# Patient Record
Sex: Female | Born: 1965
Health system: Southern US, Community
[De-identification: ages and names within clinical notes are randomized; demographics above are authoritative.]

## PROBLEM LIST (undated history)

## (undated) DIAGNOSIS — E1165 Type 2 diabetes mellitus with hyperglycemia: Secondary | ICD-10-CM

## (undated) DIAGNOSIS — D803 Selective deficiency of immunoglobulin G [IgG] subclasses: Secondary | ICD-10-CM

## (undated) DIAGNOSIS — R519 Headache, unspecified: Secondary | ICD-10-CM

## (undated) DIAGNOSIS — E1140 Type 2 diabetes mellitus with diabetic neuropathy, unspecified: Secondary | ICD-10-CM

## (undated) DIAGNOSIS — E039 Hypothyroidism, unspecified: Secondary | ICD-10-CM

## (undated) DIAGNOSIS — I700 Atherosclerosis of aorta: Secondary | ICD-10-CM

## (undated) DIAGNOSIS — N183 Chronic kidney disease, stage 3 unspecified: Secondary | ICD-10-CM

## (undated) DIAGNOSIS — G4733 Obstructive sleep apnea (adult) (pediatric): Secondary | ICD-10-CM

## (undated) DIAGNOSIS — R51 Headache: Secondary | ICD-10-CM

## (undated) DIAGNOSIS — D509 Iron deficiency anemia, unspecified: Secondary | ICD-10-CM

## (undated) DIAGNOSIS — N189 Chronic kidney disease, unspecified: Secondary | ICD-10-CM

## (undated) DIAGNOSIS — U071 COVID-19: Secondary | ICD-10-CM

## (undated) DIAGNOSIS — J449 Chronic obstructive pulmonary disease, unspecified: Secondary | ICD-10-CM

## (undated) DIAGNOSIS — I2510 Atherosclerotic heart disease of native coronary artery without angina pectoris: Secondary | ICD-10-CM

## (undated) DIAGNOSIS — I499 Cardiac arrhythmia, unspecified: Secondary | ICD-10-CM

## (undated) DIAGNOSIS — M1990 Unspecified osteoarthritis, unspecified site: Secondary | ICD-10-CM

## (undated) DIAGNOSIS — J189 Pneumonia, unspecified organism: Secondary | ICD-10-CM

## (undated) DIAGNOSIS — K769 Liver disease, unspecified: Secondary | ICD-10-CM

## (undated) DIAGNOSIS — E785 Hyperlipidemia, unspecified: Secondary | ICD-10-CM

## (undated) DIAGNOSIS — R7881 Bacteremia: Secondary | ICD-10-CM

## (undated) DIAGNOSIS — K259 Gastric ulcer, unspecified as acute or chronic, without hemorrhage or perforation: Secondary | ICD-10-CM

## (undated) DIAGNOSIS — D126 Benign neoplasm of colon, unspecified: Secondary | ICD-10-CM

## (undated) DIAGNOSIS — R0600 Dyspnea, unspecified: Secondary | ICD-10-CM

## (undated) DIAGNOSIS — G4730 Sleep apnea, unspecified: Secondary | ICD-10-CM

## (undated) DIAGNOSIS — E559 Vitamin D deficiency, unspecified: Secondary | ICD-10-CM

## (undated) DIAGNOSIS — J42 Unspecified chronic bronchitis: Secondary | ICD-10-CM

## (undated) DIAGNOSIS — R000 Tachycardia, unspecified: Secondary | ICD-10-CM

## (undated) DIAGNOSIS — G2581 Restless legs syndrome: Secondary | ICD-10-CM

## (undated) DIAGNOSIS — N390 Urinary tract infection, site not specified: Secondary | ICD-10-CM

## (undated) DIAGNOSIS — E538 Deficiency of other specified B group vitamins: Secondary | ICD-10-CM

## (undated) DIAGNOSIS — K760 Fatty (change of) liver, not elsewhere classified: Secondary | ICD-10-CM

## (undated) DIAGNOSIS — G43909 Migraine, unspecified, not intractable, without status migrainosus: Secondary | ICD-10-CM

## (undated) DIAGNOSIS — K219 Gastro-esophageal reflux disease without esophagitis: Secondary | ICD-10-CM

## (undated) DIAGNOSIS — F32A Depression, unspecified: Secondary | ICD-10-CM

## (undated) DIAGNOSIS — R002 Palpitations: Secondary | ICD-10-CM

## (undated) DIAGNOSIS — I509 Heart failure, unspecified: Secondary | ICD-10-CM

## (undated) DIAGNOSIS — G8929 Other chronic pain: Secondary | ICD-10-CM

## (undated) DIAGNOSIS — R296 Repeated falls: Secondary | ICD-10-CM

## (undated) DIAGNOSIS — G9340 Encephalopathy, unspecified: Secondary | ICD-10-CM

## (undated) DIAGNOSIS — I38 Endocarditis, valve unspecified: Secondary | ICD-10-CM

## (undated) DIAGNOSIS — F319 Bipolar disorder, unspecified: Secondary | ICD-10-CM

## (undated) DIAGNOSIS — I10 Essential (primary) hypertension: Secondary | ICD-10-CM

## (undated) DIAGNOSIS — J302 Other seasonal allergic rhinitis: Secondary | ICD-10-CM

## (undated) DIAGNOSIS — I219 Acute myocardial infarction, unspecified: Secondary | ICD-10-CM

## (undated) DIAGNOSIS — Z7982 Long term (current) use of aspirin: Secondary | ICD-10-CM

## (undated) DIAGNOSIS — K7581 Nonalcoholic steatohepatitis (NASH): Secondary | ICD-10-CM

## (undated) DIAGNOSIS — R160 Hepatomegaly, not elsewhere classified: Secondary | ICD-10-CM

## (undated) DIAGNOSIS — G459 Transient cerebral ischemic attack, unspecified: Secondary | ICD-10-CM

## (undated) DIAGNOSIS — N3010 Interstitial cystitis (chronic) without hematuria: Secondary | ICD-10-CM

## (undated) DIAGNOSIS — R32 Unspecified urinary incontinence: Secondary | ICD-10-CM

## (undated) DIAGNOSIS — I6789 Other cerebrovascular disease: Secondary | ICD-10-CM

## (undated) DIAGNOSIS — F329 Major depressive disorder, single episode, unspecified: Secondary | ICD-10-CM

## (undated) DIAGNOSIS — I739 Peripheral vascular disease, unspecified: Secondary | ICD-10-CM

## (undated) DIAGNOSIS — K5900 Constipation, unspecified: Secondary | ICD-10-CM

## (undated) DIAGNOSIS — F419 Anxiety disorder, unspecified: Secondary | ICD-10-CM

## (undated) DIAGNOSIS — Z7902 Long term (current) use of antithrombotics/antiplatelets: Secondary | ICD-10-CM

## (undated) DIAGNOSIS — J45909 Unspecified asthma, uncomplicated: Secondary | ICD-10-CM

## (undated) DIAGNOSIS — F250 Schizoaffective disorder, bipolar type: Secondary | ICD-10-CM

## (undated) DIAGNOSIS — R42 Dizziness and giddiness: Secondary | ICD-10-CM

## (undated) DIAGNOSIS — G589 Mononeuropathy, unspecified: Secondary | ICD-10-CM

## (undated) HISTORY — DX: Headache: R51

## (undated) HISTORY — PX: CHOLECYSTECTOMY: SHX55

## (undated) HISTORY — DX: Pneumonia, unspecified organism: J18.9

## (undated) HISTORY — DX: Bacteremia: R78.81

## (undated) HISTORY — DX: Endocarditis, valve unspecified: I38

## (undated) HISTORY — DX: Chronic obstructive pulmonary disease, unspecified: J44.9

## (undated) HISTORY — DX: Bipolar disorder, unspecified: F31.9

## (undated) HISTORY — DX: Hypomagnesemia: E83.42

## (undated) HISTORY — PX: UPPER GI ENDOSCOPY: SHX6162

## (undated) HISTORY — DX: Headache, unspecified: R51.9

## (undated) HISTORY — DX: Restless legs syndrome: G25.81

## (undated) HISTORY — DX: Type 2 diabetes mellitus with diabetic neuropathy, unspecified: E11.40

## (undated) HISTORY — DX: Gastro-esophageal reflux disease without esophagitis: K21.9

## (undated) HISTORY — DX: COVID-19: U07.1

## (undated) HISTORY — DX: Hyperlipidemia, unspecified: E78.5

## (undated) HISTORY — DX: Type 2 diabetes mellitus with hyperglycemia: E11.65

## (undated) HISTORY — DX: Obstructive sleep apnea (adult) (pediatric): G47.33

## (undated) HISTORY — DX: Mononeuropathy, unspecified: G58.9

## (undated) HISTORY — DX: Other chronic pain: G89.29

## (undated) HISTORY — DX: Essential (primary) hypertension: I10

## (undated) HISTORY — DX: Other seasonal allergic rhinitis: J30.2

## (undated) HISTORY — DX: Morbid (severe) obesity due to excess calories: E66.01

## (undated) HISTORY — PX: ABDOMINAL HYSTERECTOMY: SHX81

## (undated) HISTORY — DX: Acute myocardial infarction, unspecified: I21.9

## (undated) HISTORY — DX: Vitamin D deficiency, unspecified: E55.9

## (undated) HISTORY — DX: Sleep apnea, unspecified: G47.30

## (undated) HISTORY — DX: Hypothyroidism, unspecified: E03.9

## (undated) HISTORY — DX: Liver disease, unspecified: K76.9

## (undated) HISTORY — DX: Transient cerebral ischemic attack, unspecified: G45.9

## (undated) HISTORY — DX: Unspecified asthma, uncomplicated: J45.909

## (undated) HISTORY — PX: BLADDER SURGERY: SHX569

## (undated) HISTORY — DX: Cardiac arrhythmia, unspecified: I49.9

## (undated) HISTORY — DX: Unspecified urinary incontinence: R32

## (undated) HISTORY — PX: TUBAL LIGATION: SHX77

## (undated) HISTORY — DX: Migraine, unspecified, not intractable, without status migrainosus: G43.909

## (undated) HISTORY — DX: Urinary tract infection, site not specified: N39.0

---

## 1997-12-07 ENCOUNTER — Emergency Department (HOSPITAL_COMMUNITY): Admission: EM | Admit: 1997-12-07 | Discharge: 1997-12-07 | Payer: Self-pay | Admitting: Emergency Medicine

## 1998-01-06 ENCOUNTER — Emergency Department (HOSPITAL_COMMUNITY): Admission: EM | Admit: 1998-01-06 | Discharge: 1998-01-06 | Payer: Self-pay | Admitting: Emergency Medicine

## 1998-01-23 ENCOUNTER — Emergency Department (HOSPITAL_COMMUNITY): Admission: EM | Admit: 1998-01-23 | Discharge: 1998-01-23 | Payer: Self-pay | Admitting: Emergency Medicine

## 1998-01-29 ENCOUNTER — Encounter: Admission: RE | Admit: 1998-01-29 | Discharge: 1998-01-29 | Payer: Self-pay | Admitting: Internal Medicine

## 1998-04-08 ENCOUNTER — Encounter: Admission: RE | Admit: 1998-04-08 | Discharge: 1998-04-08 | Payer: Self-pay | Admitting: Internal Medicine

## 1998-04-26 ENCOUNTER — Encounter: Payer: Self-pay | Admitting: Emergency Medicine

## 1998-04-26 ENCOUNTER — Encounter: Admission: RE | Admit: 1998-04-26 | Discharge: 1998-04-26 | Payer: Self-pay | Admitting: Internal Medicine

## 1998-04-26 ENCOUNTER — Emergency Department (HOSPITAL_COMMUNITY): Admission: EM | Admit: 1998-04-26 | Discharge: 1998-04-26 | Payer: Self-pay | Admitting: Emergency Medicine

## 1998-05-24 ENCOUNTER — Encounter: Admission: RE | Admit: 1998-05-24 | Discharge: 1998-05-24 | Payer: Self-pay | Admitting: Internal Medicine

## 1998-06-16 ENCOUNTER — Emergency Department (HOSPITAL_COMMUNITY): Admission: EM | Admit: 1998-06-16 | Discharge: 1998-06-16 | Payer: Self-pay | Admitting: Emergency Medicine

## 1998-06-24 ENCOUNTER — Ambulatory Visit (HOSPITAL_COMMUNITY): Admission: RE | Admit: 1998-06-24 | Discharge: 1998-06-24 | Payer: Self-pay | Admitting: Internal Medicine

## 1998-06-24 ENCOUNTER — Other Ambulatory Visit: Admission: RE | Admit: 1998-06-24 | Discharge: 1998-06-24 | Payer: Self-pay | Admitting: *Deleted

## 1998-06-24 ENCOUNTER — Encounter: Admission: RE | Admit: 1998-06-24 | Discharge: 1998-06-24 | Payer: Self-pay | Admitting: Internal Medicine

## 1998-07-14 ENCOUNTER — Encounter: Admission: RE | Admit: 1998-07-14 | Discharge: 1998-07-14 | Payer: Self-pay | Admitting: Hematology and Oncology

## 1998-07-15 ENCOUNTER — Encounter: Admission: RE | Admit: 1998-07-15 | Discharge: 1998-07-15 | Payer: Self-pay | Admitting: Internal Medicine

## 1998-12-09 ENCOUNTER — Emergency Department (HOSPITAL_COMMUNITY): Admission: EM | Admit: 1998-12-09 | Discharge: 1998-12-09 | Payer: Self-pay | Admitting: Emergency Medicine

## 1998-12-09 ENCOUNTER — Encounter: Payer: Self-pay | Admitting: Emergency Medicine

## 1998-12-13 ENCOUNTER — Encounter: Admission: RE | Admit: 1998-12-13 | Discharge: 1998-12-13 | Payer: Self-pay | Admitting: Internal Medicine

## 1998-12-20 ENCOUNTER — Encounter: Admission: RE | Admit: 1998-12-20 | Discharge: 1998-12-20 | Payer: Self-pay | Admitting: Internal Medicine

## 1998-12-27 ENCOUNTER — Encounter: Admission: RE | Admit: 1998-12-27 | Discharge: 1998-12-27 | Payer: Self-pay | Admitting: Internal Medicine

## 1999-01-21 ENCOUNTER — Encounter: Admission: RE | Admit: 1999-01-21 | Discharge: 1999-01-21 | Payer: Self-pay | Admitting: Internal Medicine

## 1999-01-27 ENCOUNTER — Encounter: Admission: RE | Admit: 1999-01-27 | Discharge: 1999-01-27 | Payer: Self-pay | Admitting: Internal Medicine

## 1999-02-01 ENCOUNTER — Emergency Department (HOSPITAL_COMMUNITY): Admission: EM | Admit: 1999-02-01 | Discharge: 1999-02-01 | Payer: Self-pay | Admitting: Emergency Medicine

## 1999-02-04 ENCOUNTER — Encounter: Admission: RE | Admit: 1999-02-04 | Discharge: 1999-02-04 | Payer: Self-pay | Admitting: Internal Medicine

## 1999-02-09 ENCOUNTER — Encounter: Admission: RE | Admit: 1999-02-09 | Discharge: 1999-02-09 | Payer: Self-pay | Admitting: Internal Medicine

## 2000-01-27 ENCOUNTER — Observation Stay (HOSPITAL_COMMUNITY): Admission: AD | Admit: 2000-01-27 | Discharge: 2000-01-28 | Payer: Self-pay | Admitting: Cardiology

## 2000-02-10 ENCOUNTER — Encounter (INDEPENDENT_AMBULATORY_CARE_PROVIDER_SITE_OTHER): Payer: Self-pay | Admitting: Specialist

## 2000-02-10 ENCOUNTER — Encounter: Payer: Self-pay | Admitting: General Surgery

## 2000-02-10 ENCOUNTER — Observation Stay: Admission: RE | Admit: 2000-02-10 | Discharge: 2000-02-12 | Payer: Self-pay | Admitting: General Surgery

## 2000-12-13 ENCOUNTER — Emergency Department (HOSPITAL_COMMUNITY): Admission: EM | Admit: 2000-12-13 | Discharge: 2000-12-14 | Payer: Self-pay | Admitting: *Deleted

## 2001-02-02 ENCOUNTER — Emergency Department (HOSPITAL_COMMUNITY): Admission: EM | Admit: 2001-02-02 | Discharge: 2001-02-02 | Payer: Self-pay | Admitting: Emergency Medicine

## 2001-04-05 ENCOUNTER — Emergency Department (HOSPITAL_COMMUNITY): Admission: EM | Admit: 2001-04-05 | Discharge: 2001-04-06 | Payer: Self-pay | Admitting: *Deleted

## 2001-04-17 ENCOUNTER — Emergency Department (HOSPITAL_COMMUNITY): Admission: EM | Admit: 2001-04-17 | Discharge: 2001-04-17 | Payer: Self-pay | Admitting: Emergency Medicine

## 2001-08-31 ENCOUNTER — Emergency Department (HOSPITAL_COMMUNITY): Admission: EM | Admit: 2001-08-31 | Discharge: 2001-08-31 | Payer: Self-pay | Admitting: *Deleted

## 2001-09-18 ENCOUNTER — Emergency Department (HOSPITAL_COMMUNITY): Admission: EM | Admit: 2001-09-18 | Discharge: 2001-09-18 | Payer: Self-pay | Admitting: Emergency Medicine

## 2001-10-11 ENCOUNTER — Emergency Department (HOSPITAL_COMMUNITY): Admission: EM | Admit: 2001-10-11 | Discharge: 2001-10-11 | Payer: Self-pay | Admitting: Emergency Medicine

## 2002-01-19 ENCOUNTER — Encounter: Payer: Self-pay | Admitting: *Deleted

## 2002-01-19 ENCOUNTER — Emergency Department (HOSPITAL_COMMUNITY): Admission: EM | Admit: 2002-01-19 | Discharge: 2002-01-19 | Payer: Self-pay | Admitting: *Deleted

## 2004-03-12 ENCOUNTER — Other Ambulatory Visit: Payer: Self-pay

## 2004-03-27 ENCOUNTER — Other Ambulatory Visit: Payer: Self-pay

## 2004-03-31 ENCOUNTER — Other Ambulatory Visit: Payer: Self-pay

## 2004-04-03 ENCOUNTER — Other Ambulatory Visit: Payer: Self-pay

## 2004-07-11 ENCOUNTER — Ambulatory Visit: Payer: Self-pay | Admitting: Family Medicine

## 2004-10-05 ENCOUNTER — Ambulatory Visit: Payer: Self-pay | Admitting: Family Medicine

## 2004-10-23 ENCOUNTER — Inpatient Hospital Stay: Payer: Self-pay | Admitting: Unknown Physician Specialty

## 2004-11-07 ENCOUNTER — Inpatient Hospital Stay: Payer: Self-pay | Admitting: Unknown Physician Specialty

## 2004-11-20 ENCOUNTER — Emergency Department: Payer: Self-pay | Admitting: Emergency Medicine

## 2004-11-24 ENCOUNTER — Emergency Department: Payer: Self-pay | Admitting: Emergency Medicine

## 2004-12-16 ENCOUNTER — Emergency Department: Payer: Self-pay | Admitting: Emergency Medicine

## 2004-12-30 ENCOUNTER — Emergency Department: Payer: Self-pay | Admitting: Emergency Medicine

## 2005-01-21 ENCOUNTER — Emergency Department: Payer: Self-pay | Admitting: Unknown Physician Specialty

## 2005-01-29 ENCOUNTER — Inpatient Hospital Stay (HOSPITAL_COMMUNITY): Admission: EM | Admit: 2005-01-29 | Discharge: 2005-02-01 | Payer: Self-pay | Admitting: Emergency Medicine

## 2005-02-03 ENCOUNTER — Emergency Department (HOSPITAL_COMMUNITY): Admission: EM | Admit: 2005-02-03 | Discharge: 2005-02-04 | Payer: Self-pay | Admitting: Emergency Medicine

## 2005-02-18 ENCOUNTER — Inpatient Hospital Stay: Payer: Self-pay | Admitting: Psychiatry

## 2005-02-18 ENCOUNTER — Other Ambulatory Visit: Payer: Self-pay

## 2005-02-24 ENCOUNTER — Emergency Department: Payer: Self-pay | Admitting: Unknown Physician Specialty

## 2005-02-24 ENCOUNTER — Other Ambulatory Visit: Payer: Self-pay

## 2005-03-15 ENCOUNTER — Ambulatory Visit: Payer: Self-pay | Admitting: Urology

## 2005-04-07 ENCOUNTER — Ambulatory Visit: Payer: Self-pay | Admitting: Family Medicine

## 2005-04-08 ENCOUNTER — Inpatient Hospital Stay: Payer: Self-pay | Admitting: Psychiatry

## 2005-04-09 ENCOUNTER — Other Ambulatory Visit: Payer: Self-pay

## 2005-04-20 ENCOUNTER — Emergency Department: Payer: Self-pay | Admitting: Internal Medicine

## 2005-06-10 ENCOUNTER — Emergency Department: Payer: Self-pay | Admitting: Emergency Medicine

## 2005-06-14 ENCOUNTER — Emergency Department: Payer: Self-pay | Admitting: Emergency Medicine

## 2005-06-24 ENCOUNTER — Emergency Department: Payer: Self-pay | Admitting: Emergency Medicine

## 2005-07-09 ENCOUNTER — Emergency Department: Payer: Self-pay | Admitting: Emergency Medicine

## 2005-07-30 ENCOUNTER — Emergency Department: Payer: Self-pay | Admitting: Unknown Physician Specialty

## 2005-08-18 ENCOUNTER — Emergency Department: Payer: Self-pay | Admitting: Emergency Medicine

## 2005-09-04 HISTORY — PX: OTHER SURGICAL HISTORY: SHX169

## 2005-09-14 ENCOUNTER — Other Ambulatory Visit: Payer: Self-pay

## 2005-09-16 ENCOUNTER — Inpatient Hospital Stay: Payer: Self-pay

## 2005-09-17 ENCOUNTER — Emergency Department: Payer: Self-pay | Admitting: Emergency Medicine

## 2005-09-17 ENCOUNTER — Other Ambulatory Visit: Payer: Self-pay

## 2005-09-21 ENCOUNTER — Emergency Department: Payer: Self-pay | Admitting: Emergency Medicine

## 2005-09-27 ENCOUNTER — Emergency Department: Payer: Self-pay | Admitting: Emergency Medicine

## 2005-10-01 ENCOUNTER — Emergency Department: Payer: Self-pay | Admitting: Emergency Medicine

## 2005-10-01 ENCOUNTER — Other Ambulatory Visit: Payer: Self-pay

## 2005-10-06 ENCOUNTER — Emergency Department: Payer: Self-pay | Admitting: Emergency Medicine

## 2005-10-15 ENCOUNTER — Other Ambulatory Visit: Payer: Self-pay

## 2005-10-15 ENCOUNTER — Inpatient Hospital Stay: Payer: Self-pay | Admitting: Internal Medicine

## 2005-11-22 ENCOUNTER — Emergency Department: Payer: Self-pay | Admitting: Unknown Physician Specialty

## 2005-11-25 ENCOUNTER — Emergency Department: Payer: Self-pay | Admitting: Unknown Physician Specialty

## 2005-11-25 ENCOUNTER — Other Ambulatory Visit: Payer: Self-pay

## 2005-12-05 ENCOUNTER — Emergency Department: Payer: Self-pay | Admitting: Emergency Medicine

## 2005-12-05 ENCOUNTER — Other Ambulatory Visit: Payer: Self-pay

## 2005-12-17 ENCOUNTER — Emergency Department: Payer: Self-pay | Admitting: Emergency Medicine

## 2006-01-07 ENCOUNTER — Emergency Department: Payer: Self-pay | Admitting: Emergency Medicine

## 2006-01-24 ENCOUNTER — Inpatient Hospital Stay: Payer: Self-pay | Admitting: Psychiatry

## 2006-02-12 ENCOUNTER — Inpatient Hospital Stay: Payer: Self-pay | Admitting: Unknown Physician Specialty

## 2006-03-04 ENCOUNTER — Emergency Department: Payer: Self-pay | Admitting: General Practice

## 2006-03-10 ENCOUNTER — Other Ambulatory Visit: Payer: Self-pay

## 2006-03-10 ENCOUNTER — Emergency Department: Payer: Self-pay | Admitting: Emergency Medicine

## 2006-04-01 ENCOUNTER — Emergency Department: Payer: Self-pay | Admitting: Emergency Medicine

## 2006-04-06 ENCOUNTER — Emergency Department: Payer: Self-pay | Admitting: Unknown Physician Specialty

## 2006-04-06 ENCOUNTER — Other Ambulatory Visit: Payer: Self-pay

## 2006-04-17 ENCOUNTER — Other Ambulatory Visit: Payer: Self-pay

## 2006-04-17 ENCOUNTER — Inpatient Hospital Stay: Payer: Self-pay | Admitting: Unknown Physician Specialty

## 2006-05-11 ENCOUNTER — Emergency Department: Payer: Self-pay | Admitting: Emergency Medicine

## 2006-05-21 ENCOUNTER — Emergency Department: Payer: Self-pay | Admitting: Emergency Medicine

## 2006-05-21 ENCOUNTER — Other Ambulatory Visit: Payer: Self-pay

## 2006-06-20 ENCOUNTER — Emergency Department: Payer: Self-pay

## 2006-07-01 ENCOUNTER — Emergency Department: Payer: Self-pay | Admitting: Emergency Medicine

## 2006-07-28 ENCOUNTER — Emergency Department: Payer: Self-pay | Admitting: Emergency Medicine

## 2006-08-12 ENCOUNTER — Emergency Department: Payer: Self-pay | Admitting: Emergency Medicine

## 2006-08-25 IMAGING — CR DG ANKLE COMPLETE 3+V*L*
1 series · 5 of 5 positions shown · non-contrast
Comparison: none

REASON FOR EXAM: Pain, swelling
COMMENTS:

PROCEDURE:     DXR - DXR ANKLE LEFT COMPLETE  - January 21, 2005  [DATE]
RESULT:     This study was compared to the previous study dated
11-20-04.
Soft tissue swelling is appreciated within the medial malleolar region.
There does not appear to be evidence of fracture or dislocation.

[Series 1: view not recorded · 0.17mm/px · 5 of 5 slices shown]
[im 1/5]
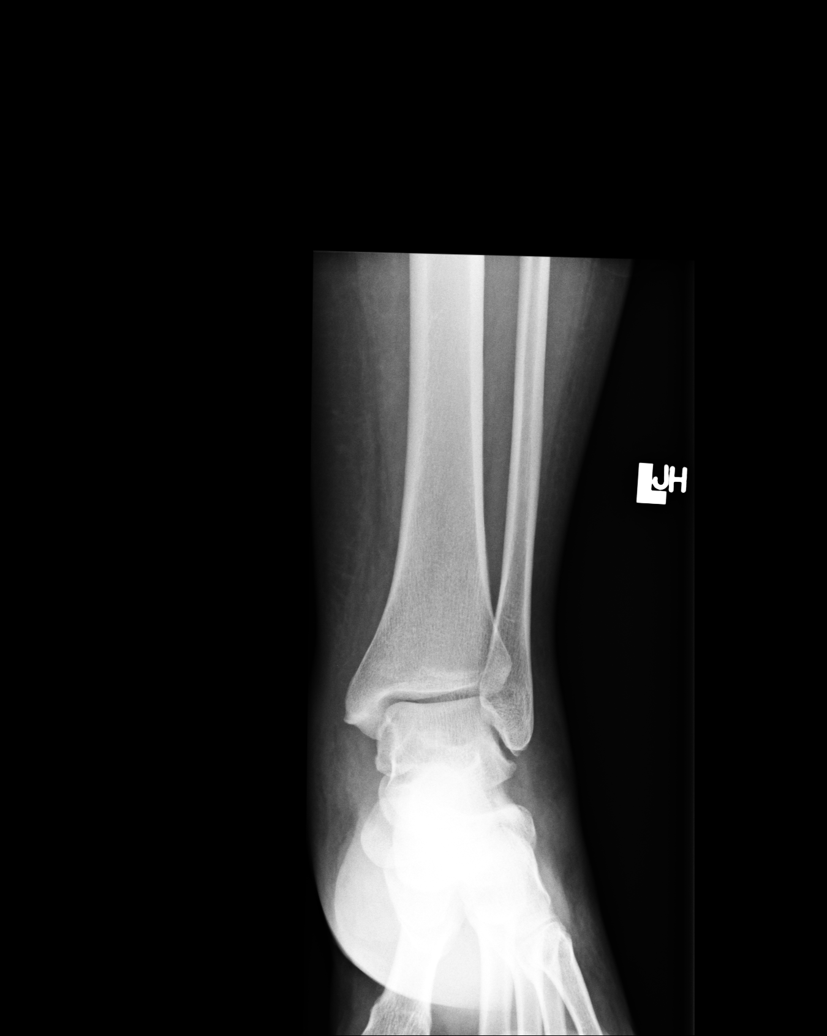
[im 2/5]
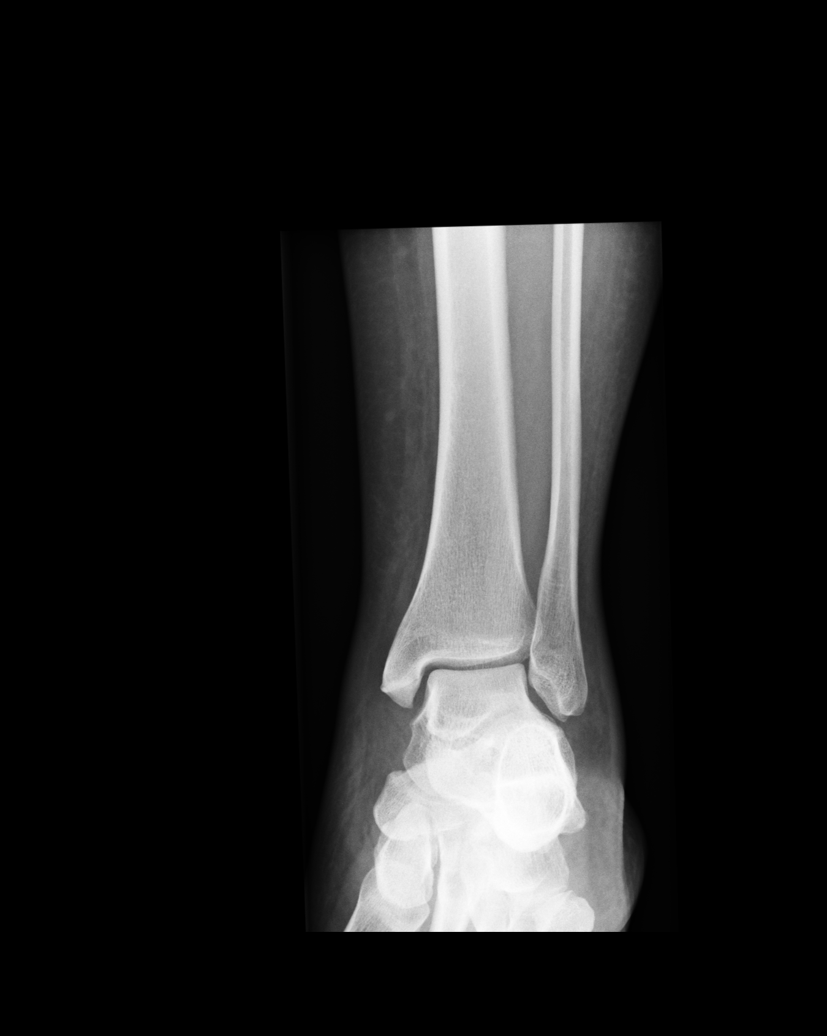
[im 3/5]
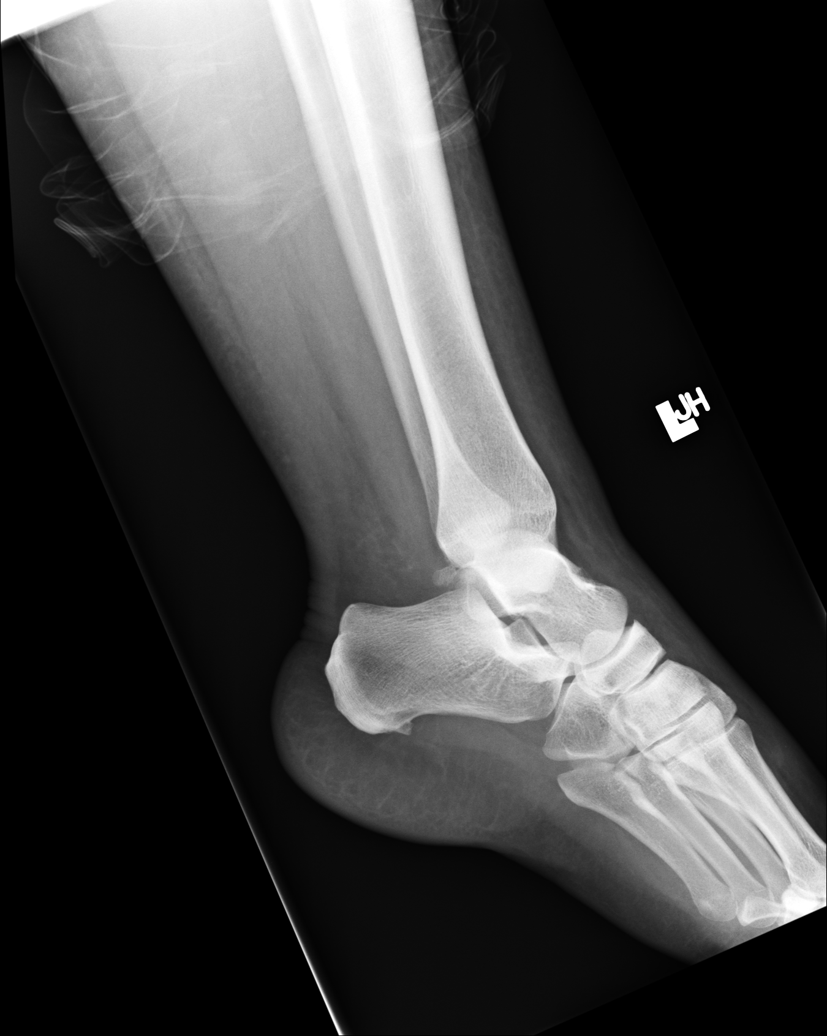
[im 4/5]
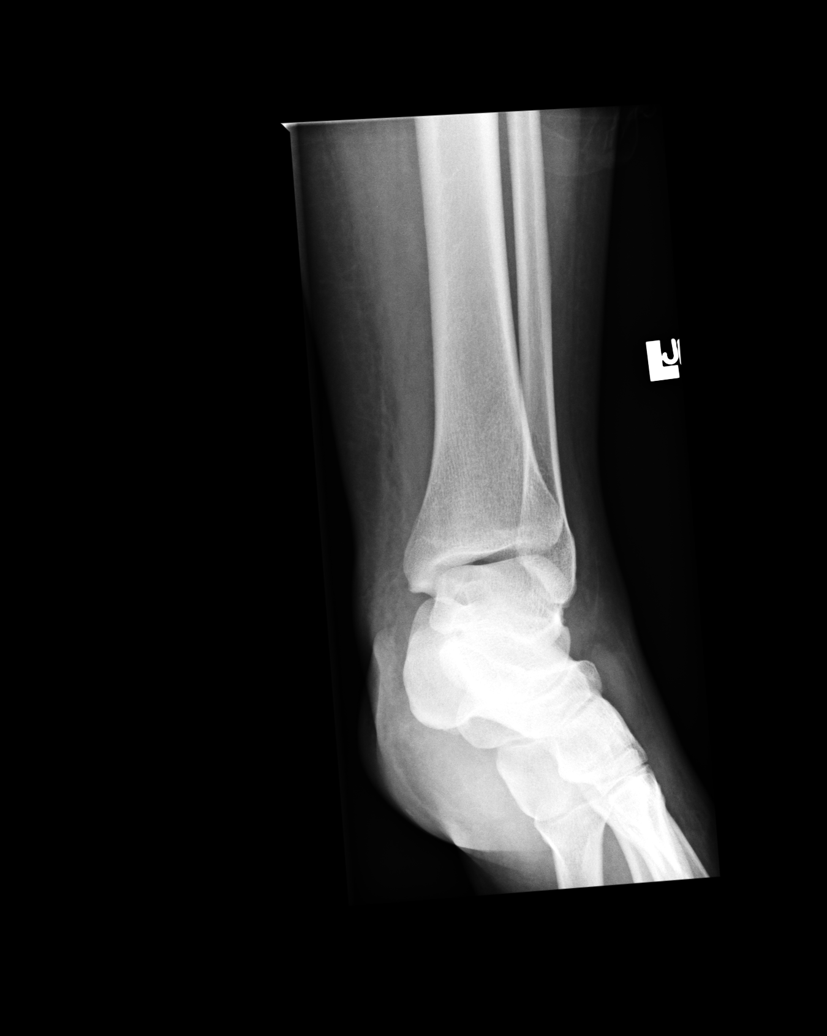
[im 5/5]
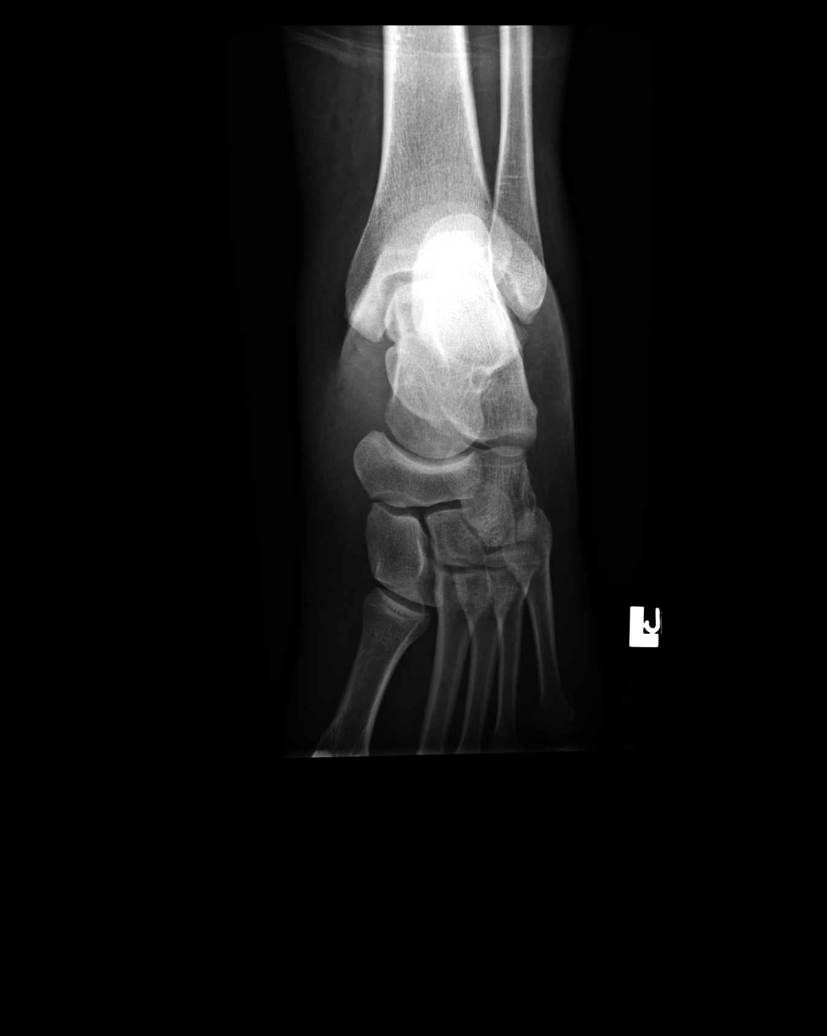

[5 of 5 positions shown; findings below may reference images not displayed]

IMPRESSION: Findings possibly representing an element of ligamentous injury without
evidence of fracture or dislocation.

## 2006-08-27 ENCOUNTER — Other Ambulatory Visit: Payer: Self-pay

## 2006-08-27 ENCOUNTER — Emergency Department: Payer: Self-pay | Admitting: Emergency Medicine

## 2006-08-31 ENCOUNTER — Emergency Department: Payer: Self-pay | Admitting: Emergency Medicine

## 2006-09-01 ENCOUNTER — Inpatient Hospital Stay: Payer: Self-pay | Admitting: Psychiatry

## 2006-09-04 IMAGING — CR DG CHEST 2V
2 series · 2 of 2 positions shown · non-contrast
Comparison: none

HISTORY: Chest pain, catheterization

CHEST 2 VIEWS:
No prior exam available for comparison
Upper normal heart size.
Normal mediastinal contours and vascularity.
Mild bronchitic changes.
No infiltrate, failure, or effusion.
No pneumothorax.

[view not recorded (1 of 2)]
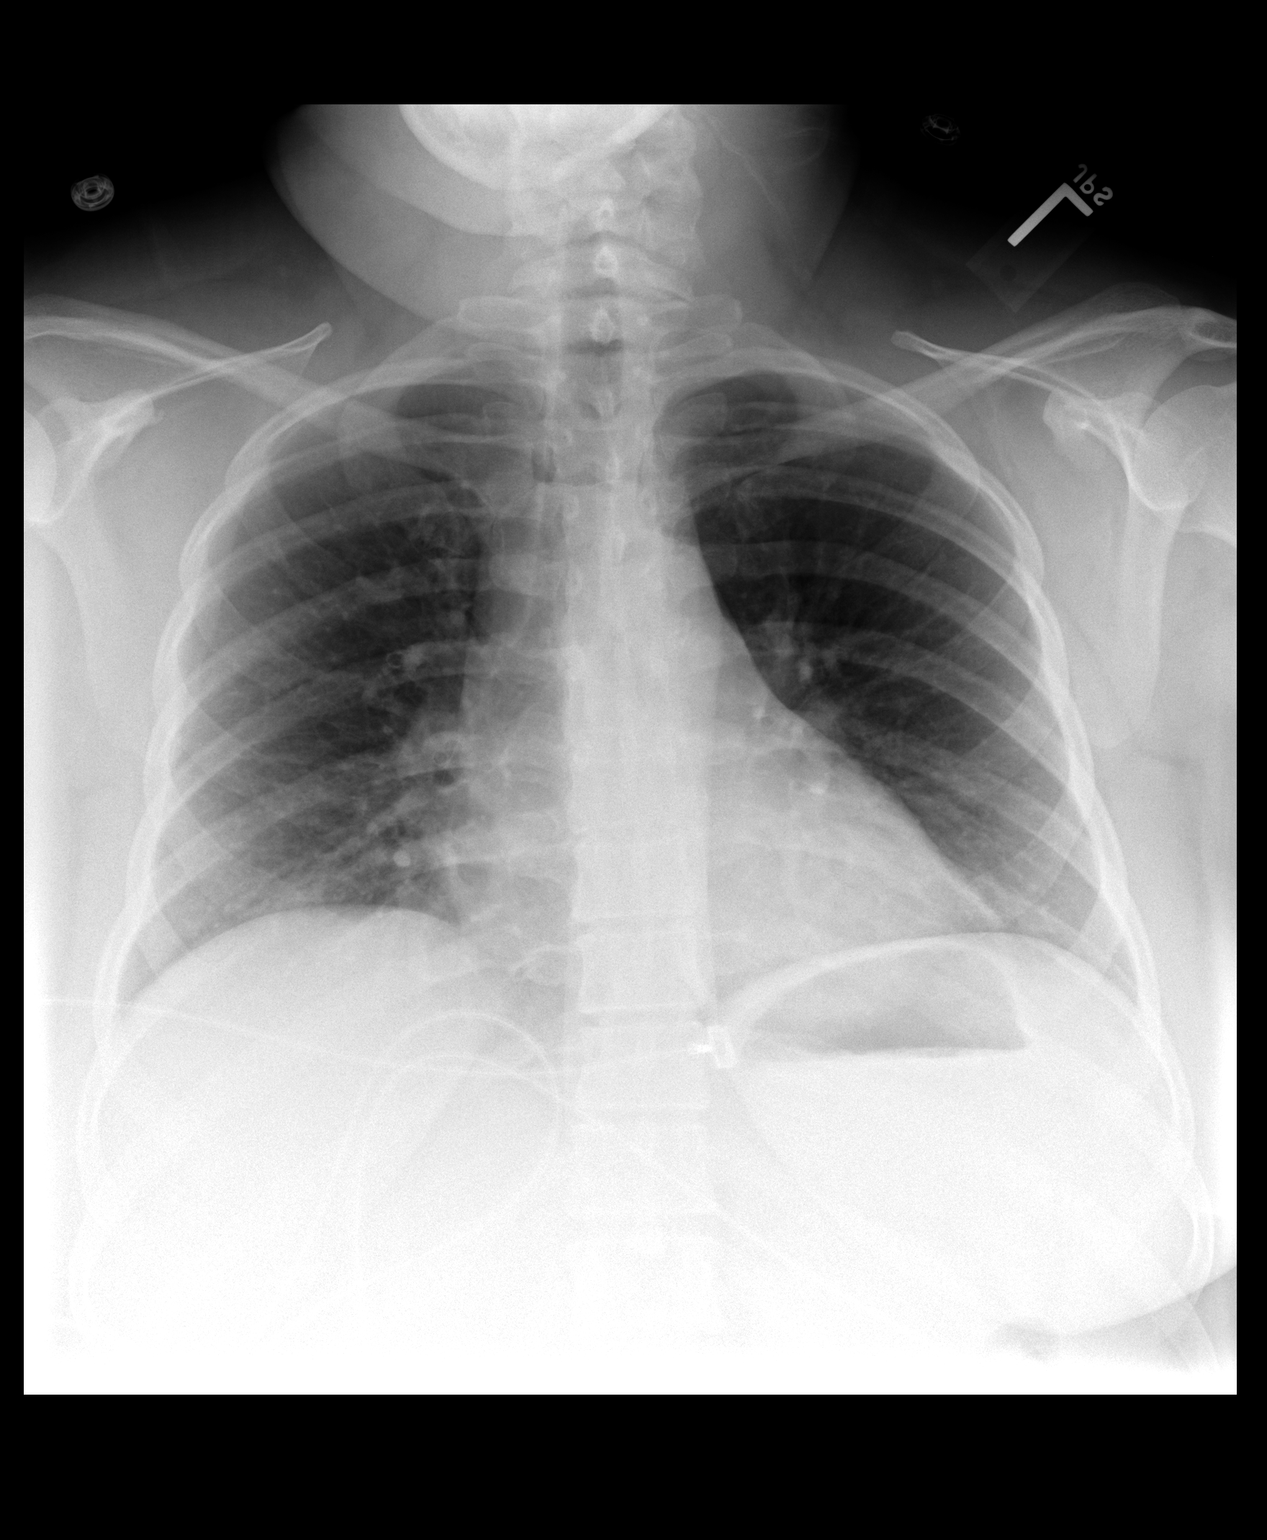

[view not recorded (2 of 2)]
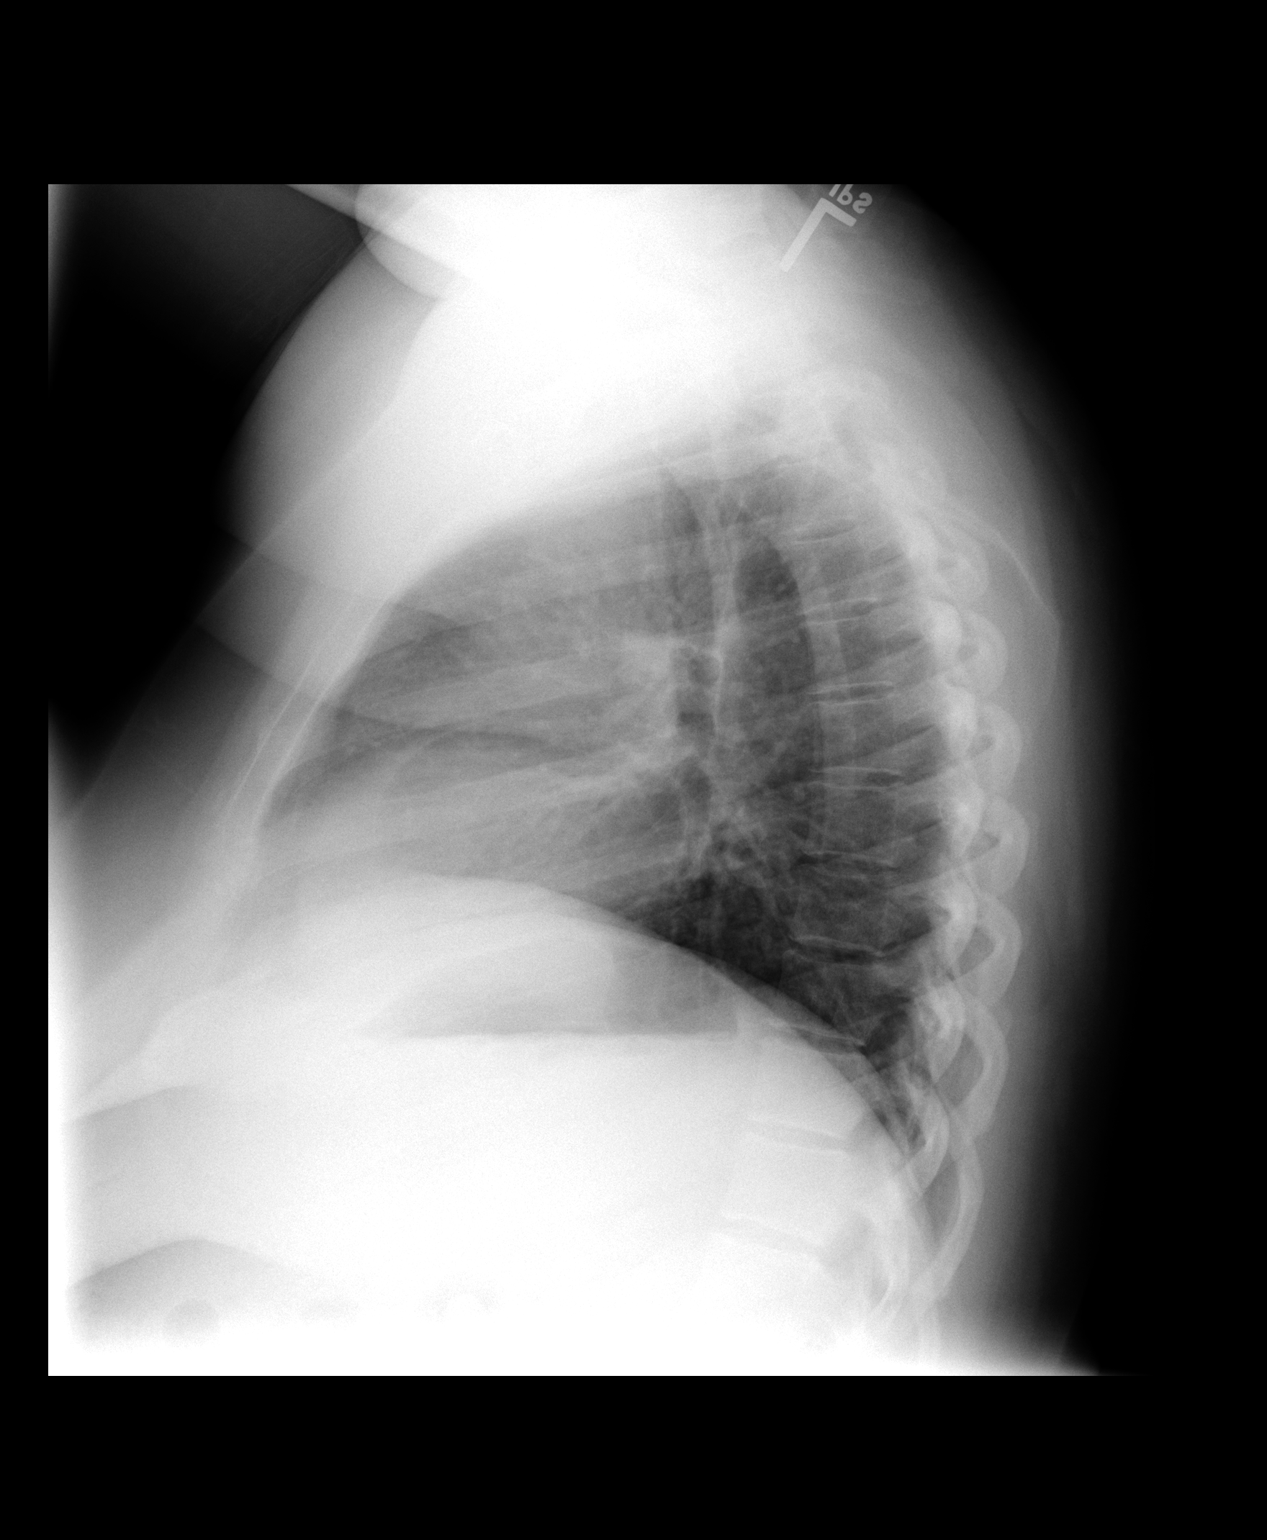

[2 of 2 positions shown; findings below may reference images not displayed]

IMPRESSION: Minimal bronchitic changes.

## 2006-09-07 IMAGING — CR DG CHEST 1V PORT
1 series · 1 of 1 positions shown · non-contrast
Comparison: 01/31/05.

CLINICAL DATA: Chest pain. Diabetes and hypertension.
 PORTABLE CHEST - 1 VIEW:

[view not recorded]
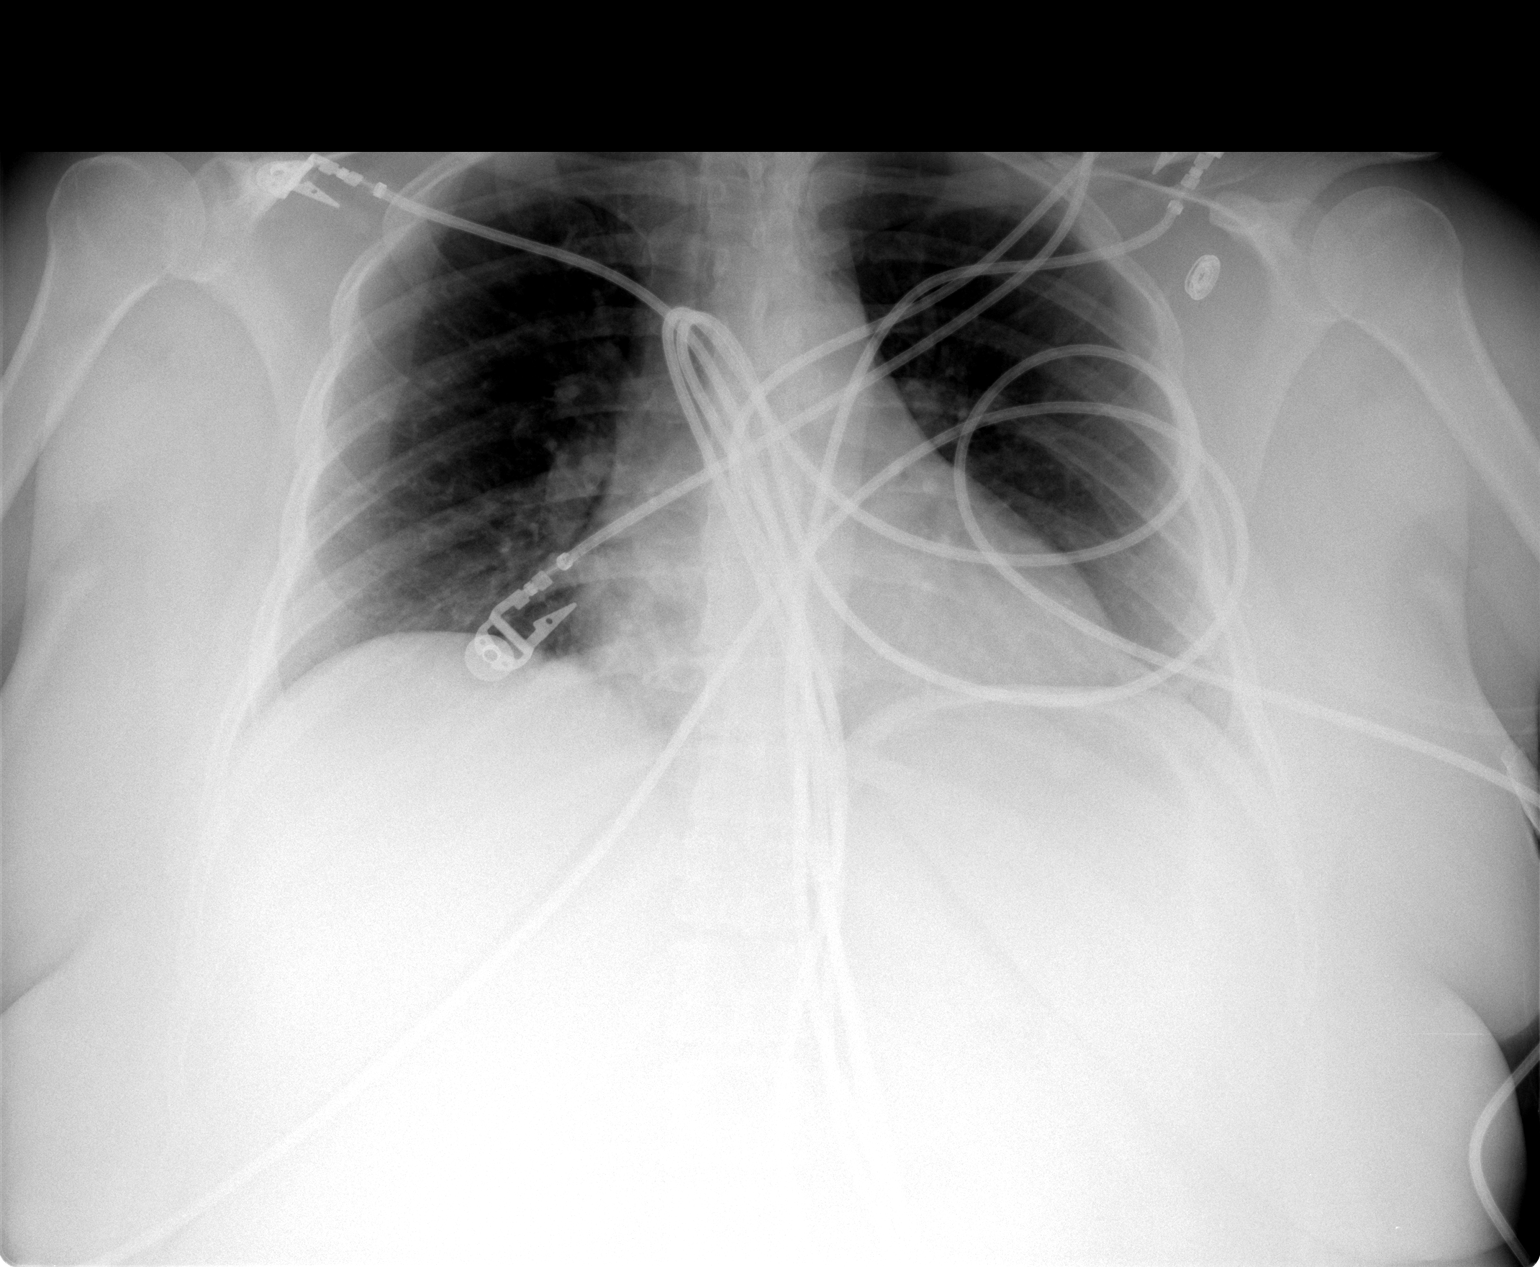

[1 of 1 positions shown; findings below may reference images not displayed]

The heart size and mediastinal contours are within normal limits.  The lungs are clear.
IMPRESSION: No acute disease.

## 2006-09-09 ENCOUNTER — Emergency Department: Payer: Self-pay | Admitting: Emergency Medicine

## 2006-09-27 ENCOUNTER — Emergency Department: Payer: Self-pay | Admitting: Emergency Medicine

## 2006-09-28 IMAGING — CR DG CHEST 2V
1 series · 2 of 2 positions shown · non-contrast
Comparison: none

REASON FOR EXAM: chest pain
COMMENTS:

PROCEDURE:     DXR - DXR CHEST PA (OR AP) AND LATERAL  - February 24, 2005 [DATE]
RESULT:        The mediastinum and hilar structures are normal.
Subsegmental atelectasis is noted in the lung bases.  Cardiovascular
structures are unremarkable.

[Series 1: view not recorded · 0.17mm/px · 2 of 2 slices shown]
[im 1/2]
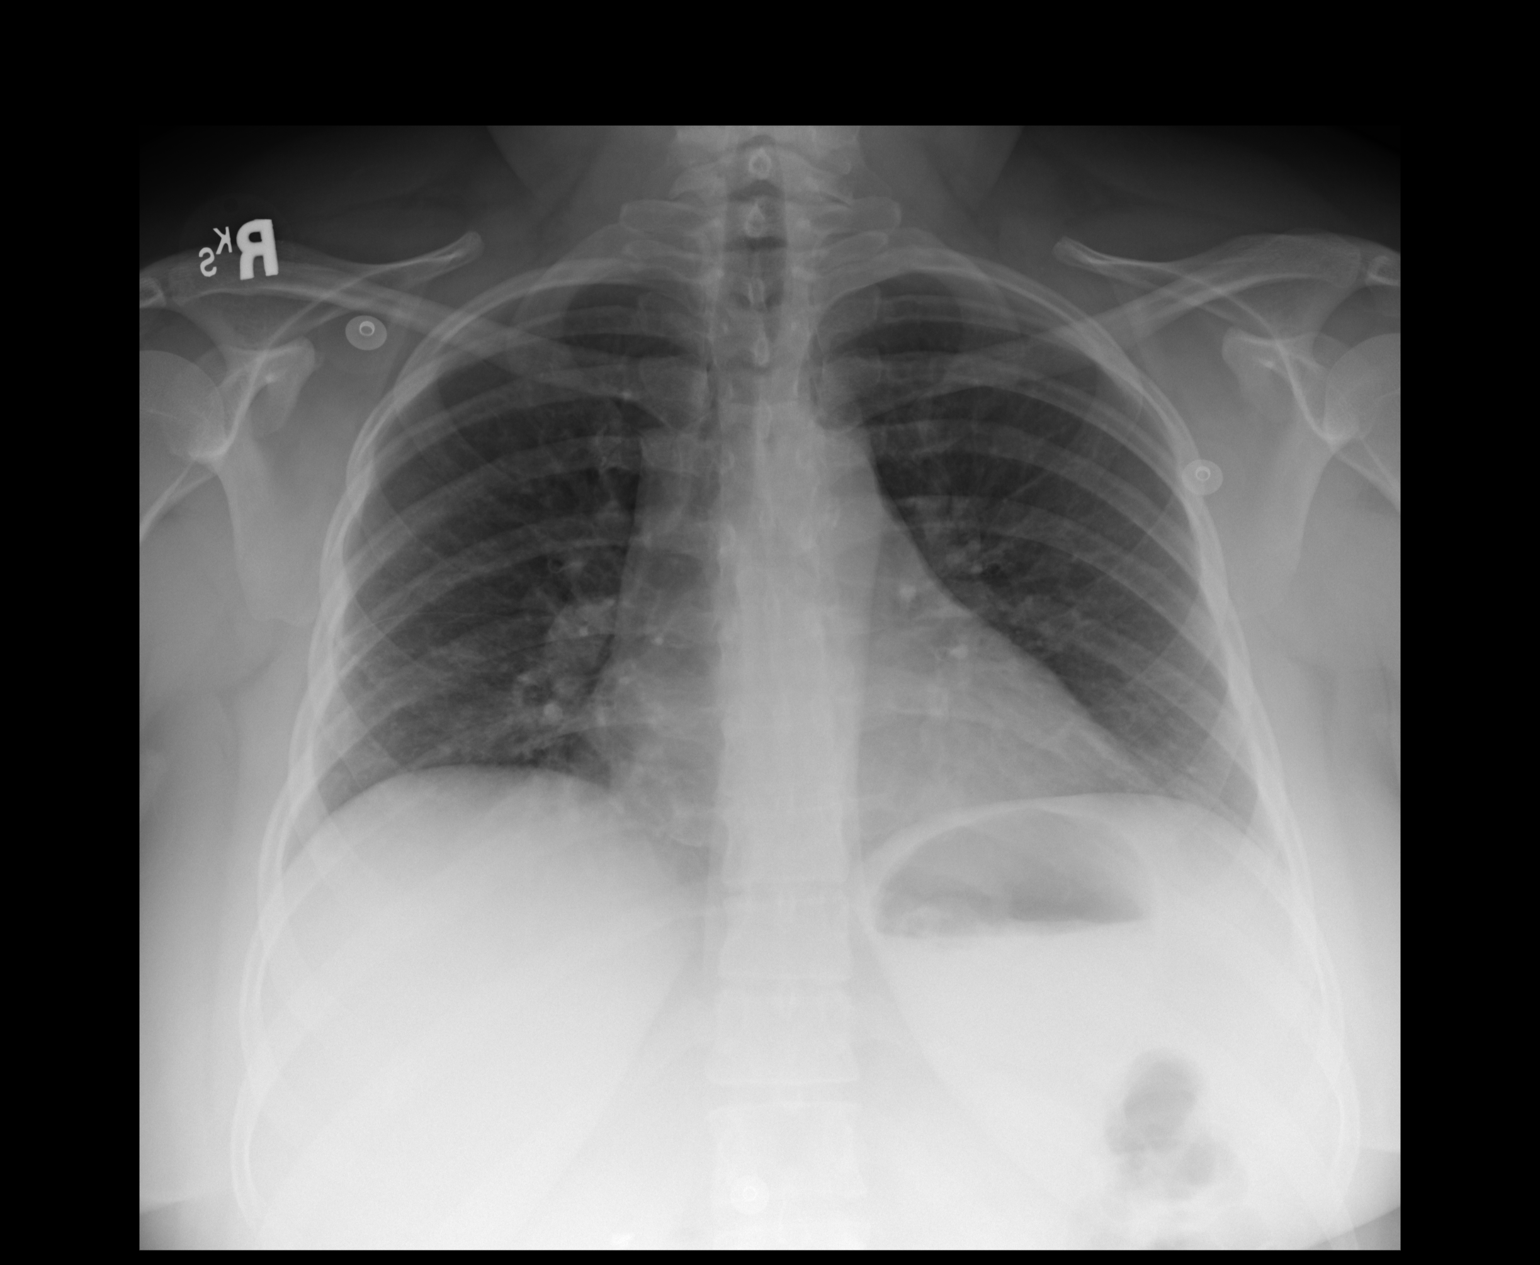
[im 2/2]
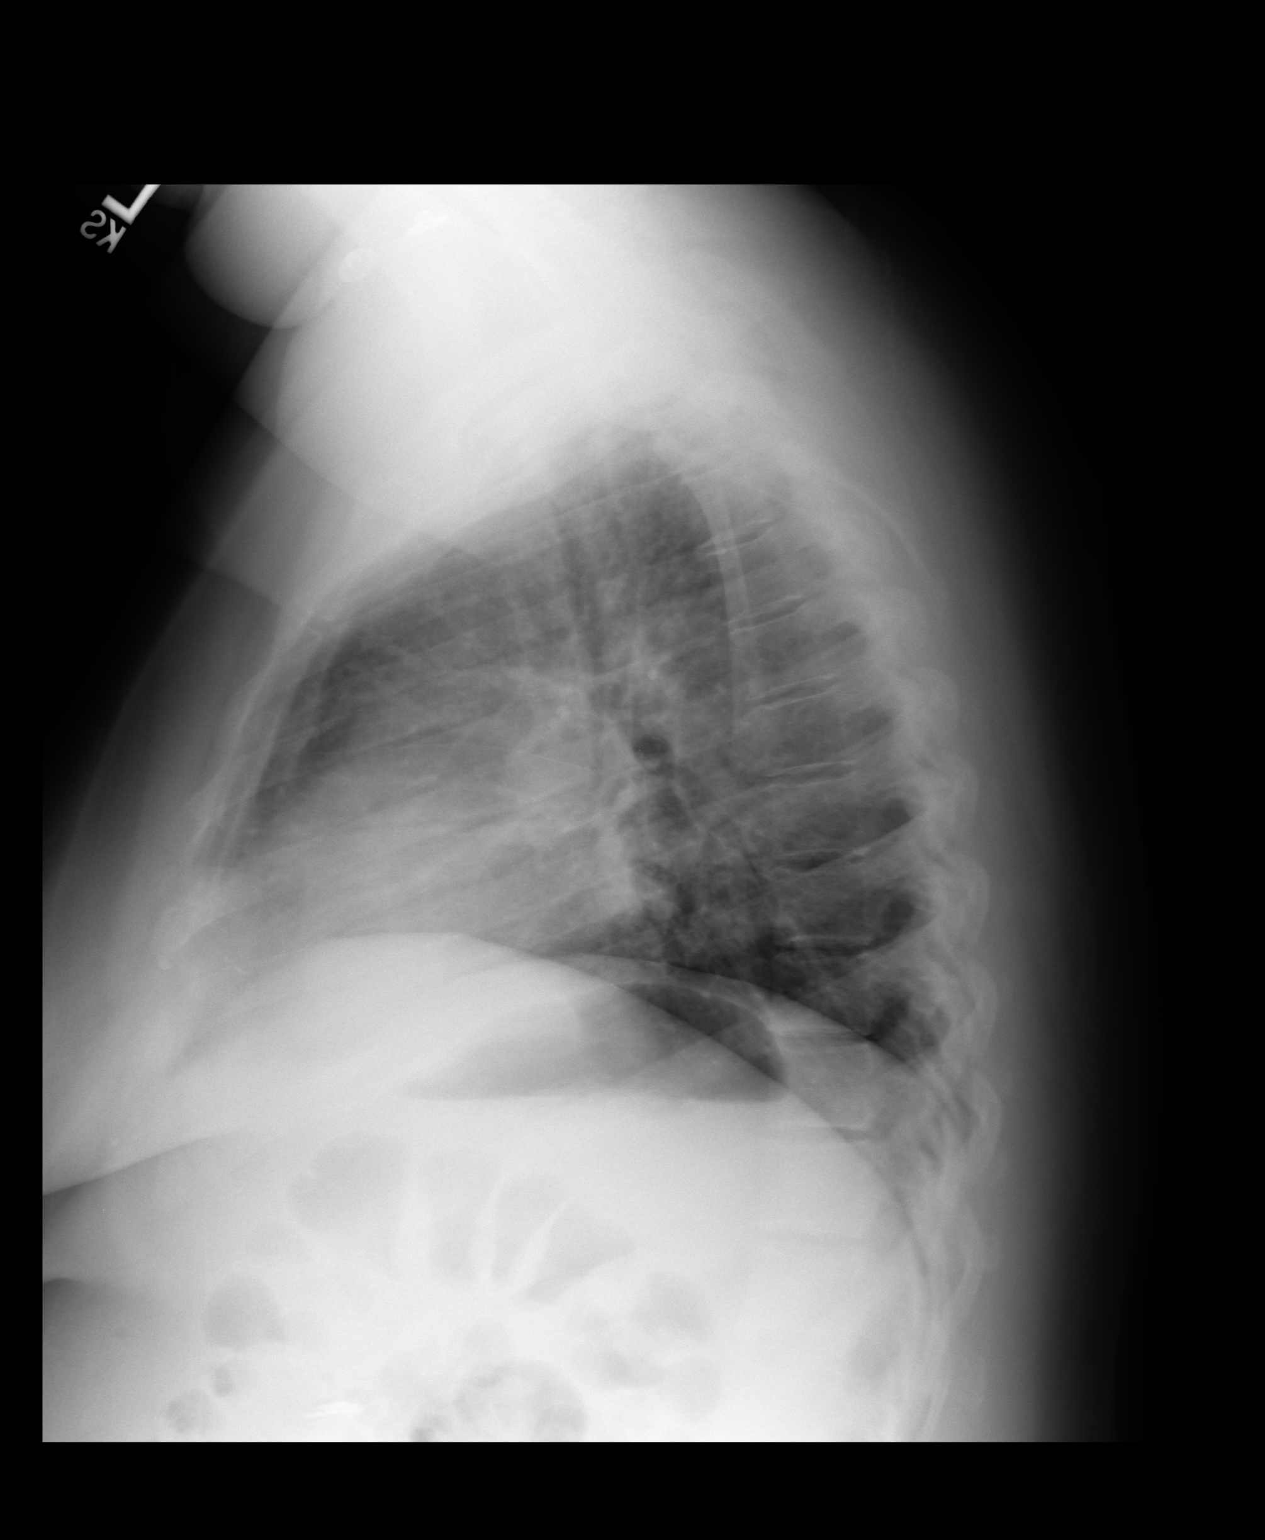

[2 of 2 positions shown; findings below may reference images not displayed]

IMPRESSION: 1.     No acute cardiopulmonary disease.
2.     Subsegmental atelectasis noted in the lung bases.

## 2006-10-28 ENCOUNTER — Emergency Department: Payer: Self-pay | Admitting: Internal Medicine

## 2006-11-09 IMAGING — US US PELV - US TRANSVAGINAL
1 series · 17 of 25 positions shown · non-contrast
Comparison: none

REASON FOR EXAM: LLQ pain
COMMENTS:

[Series 1: us pelv - us transvaginal · 17 of 44 slices shown]
[im 1/44]
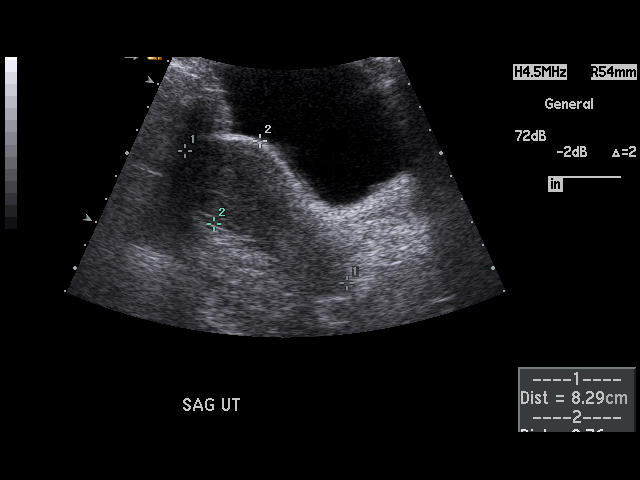
[im 4/44]
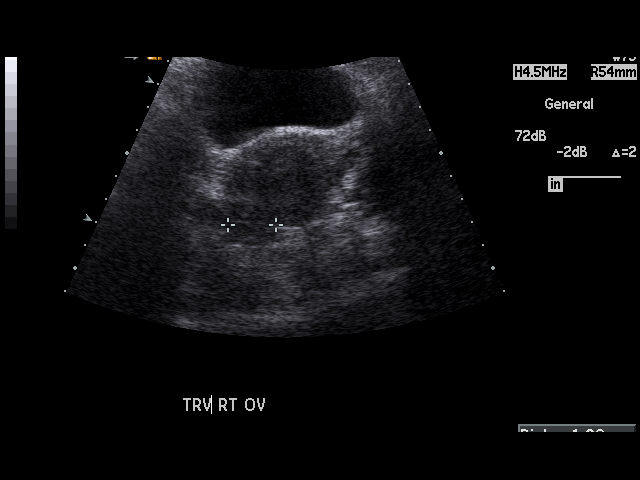
[im 6/44]
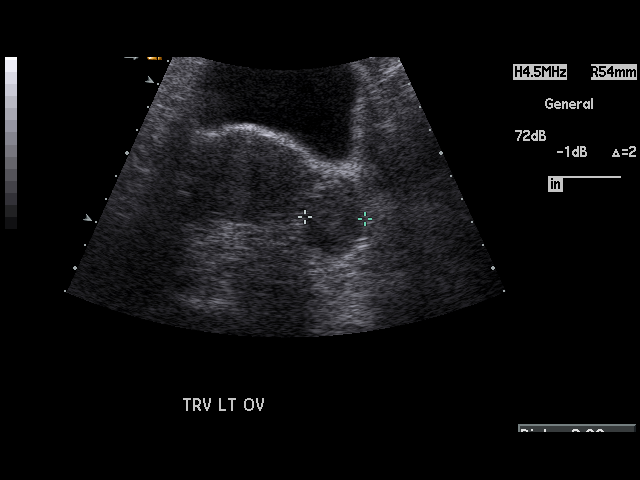
[im 9/44]
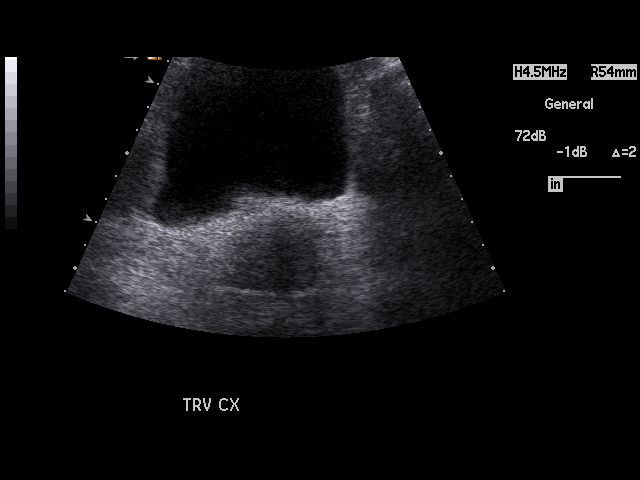
[im 11/44]
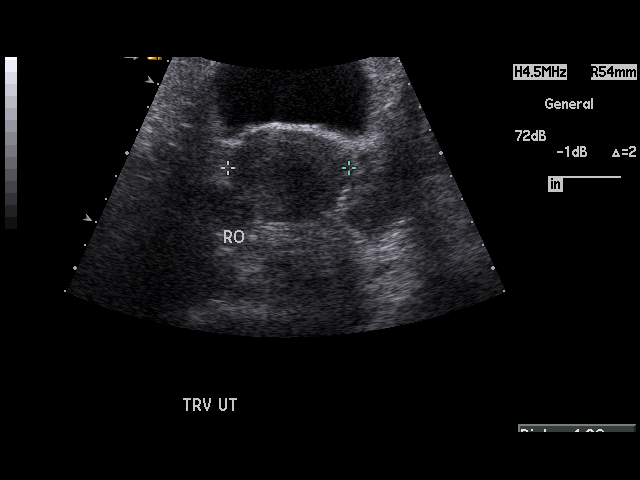
[im 15/44]
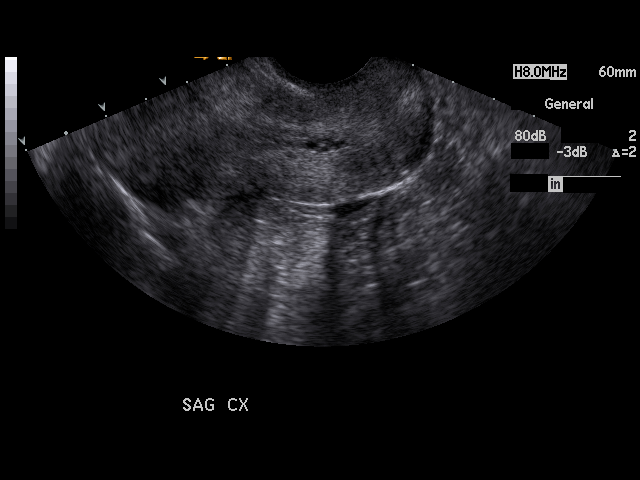
[im 17/44]
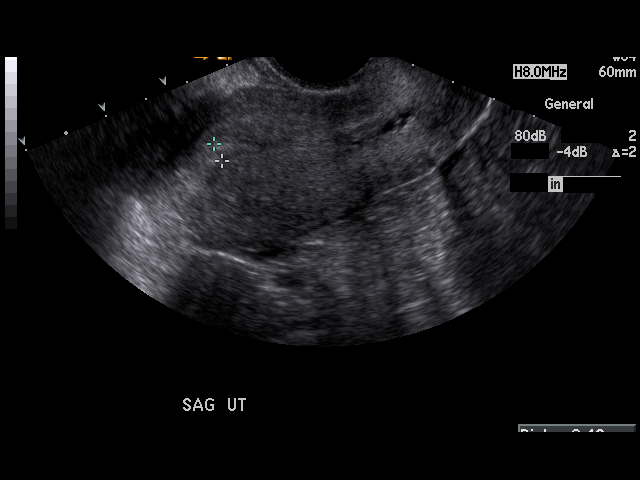
[im 20/44]
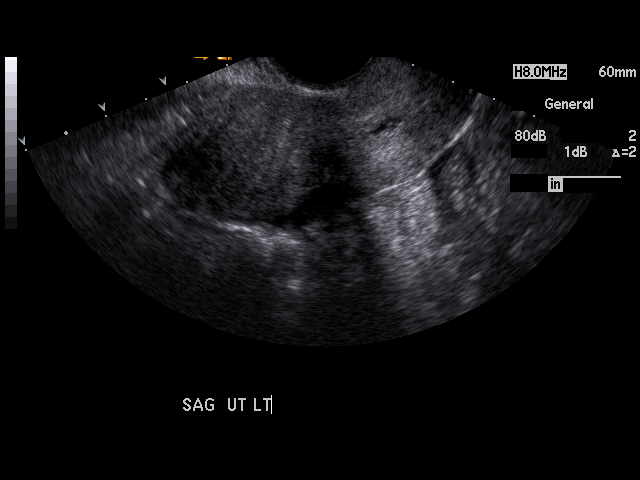
[im 22/44]
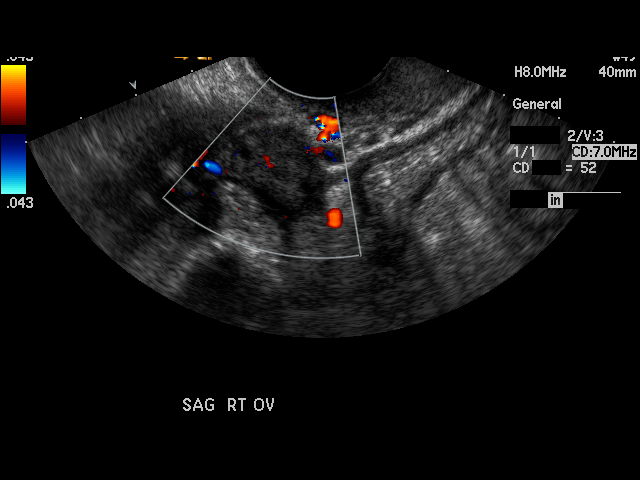
[im 24/44]
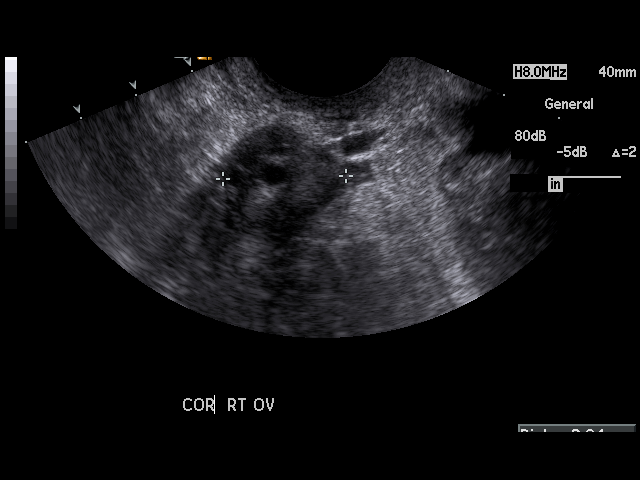
[im 27/44]
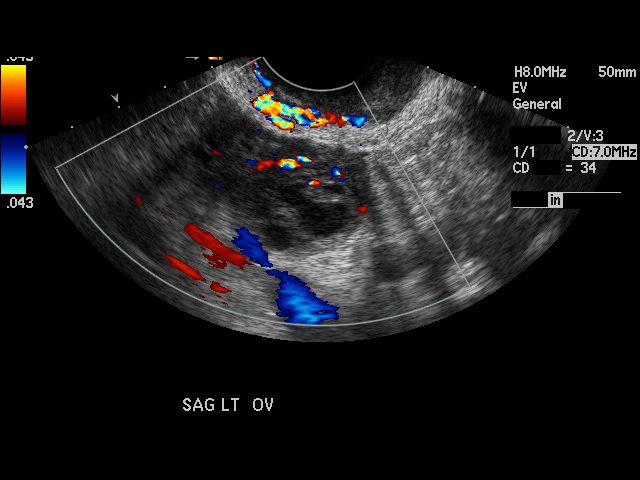
[im 29/44]
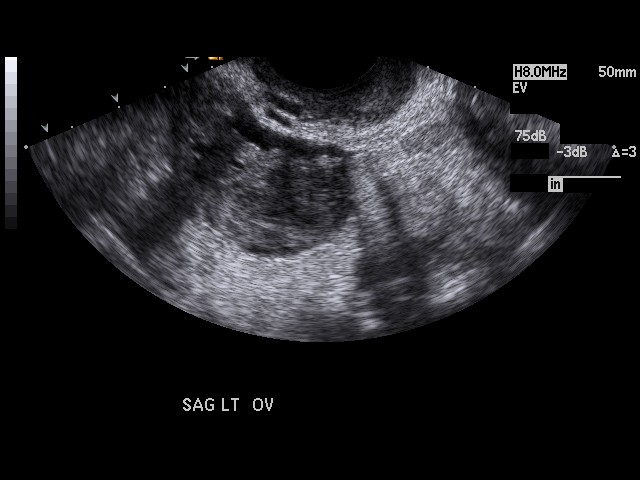
[im 33/44]
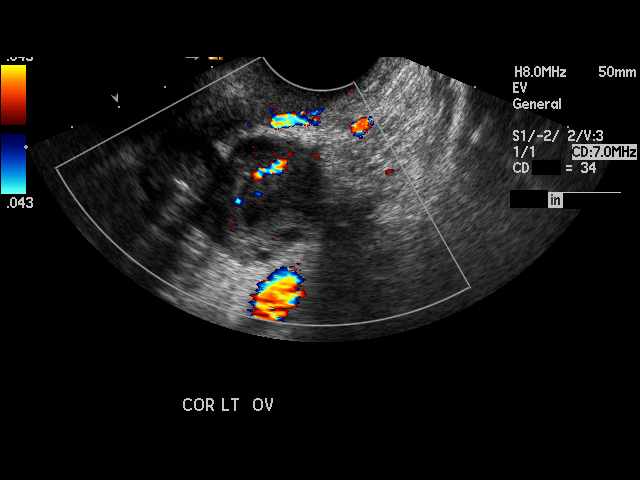
[im 35/44]
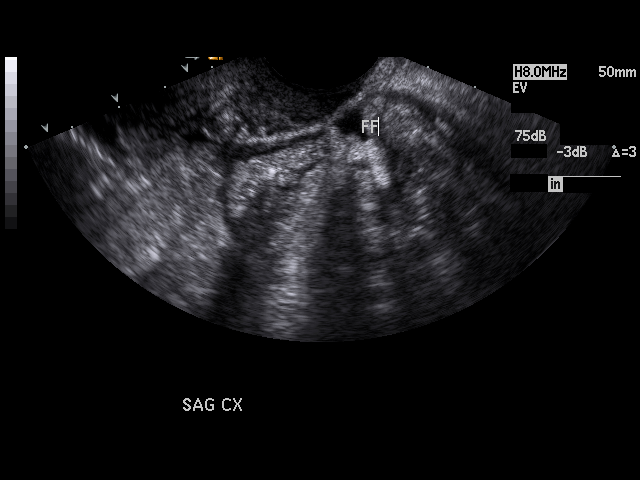
[im 38/44]
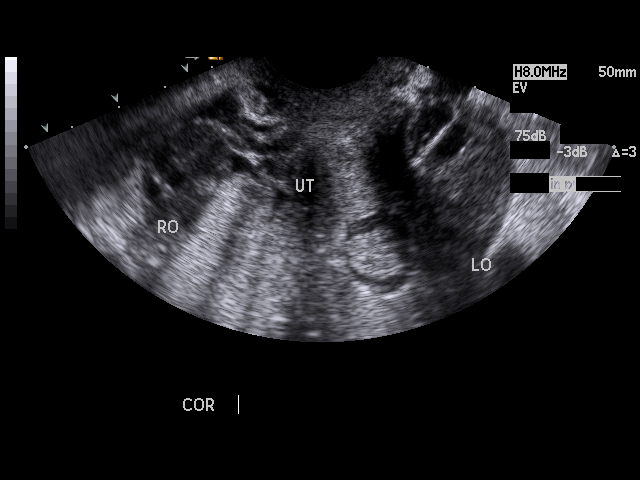
[im 40/44]
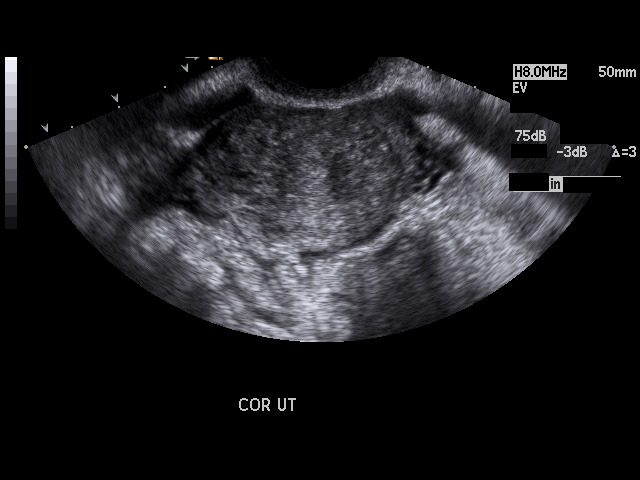
[im 44/44]
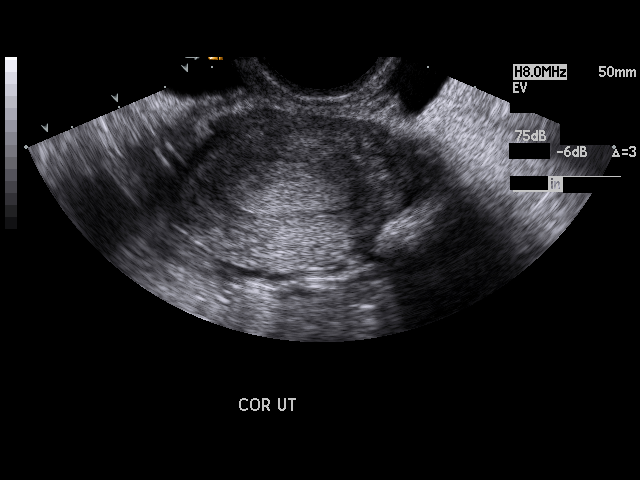

[17 of 25 positions shown; findings below may reference images not displayed]

PROCEDURE:     US  - US PELVIS MASS EXAM  - [DATE]  [DATE] [DATE]  [DATE]

RESULT:     The patient is complaining of LEFT lower quadrant discomfort.

The uterus exhibits normal echotexture. It measures 8.3 x 4.8 x 3.8 cm. The
endometrial stripe measures 4.0 mm in thickness. No abnormal endometrial
fluid collections are seen. There is a small amount of free fluid in the
cul-de-sac. The RIGHT ovary measures 2.1 x 1.9 x 1.5 cm. The LEFT ovary
measures 3.6 x 2.4 x 2.1 cm. There may be a partially collapsed cyst in the
LEFT ovary measuring approximately 1.7 x 1.6 x 1.0 cm.
IMPRESSION: 1.     Both ovaries exhibit developing follicles and there are findings that
may reflect a partially collapsed ovarian cyst on the LEFT. There is some
free fluid in the cul-de-sac.
2.     The uterus is normal in size and echotexture.

## 2006-12-04 ENCOUNTER — Ambulatory Visit: Payer: Self-pay | Admitting: Family Medicine

## 2006-12-16 ENCOUNTER — Emergency Department: Payer: Self-pay | Admitting: Emergency Medicine

## 2007-01-16 ENCOUNTER — Emergency Department: Payer: Self-pay | Admitting: Unknown Physician Specialty

## 2007-01-16 ENCOUNTER — Other Ambulatory Visit: Payer: Self-pay

## 2007-02-01 ENCOUNTER — Emergency Department: Payer: Self-pay | Admitting: General Practice

## 2007-02-11 ENCOUNTER — Emergency Department: Payer: Self-pay | Admitting: Emergency Medicine

## 2007-02-21 ENCOUNTER — Emergency Department: Payer: Self-pay | Admitting: General Practice

## 2007-03-07 ENCOUNTER — Emergency Department: Payer: Self-pay | Admitting: Emergency Medicine

## 2007-03-31 ENCOUNTER — Emergency Department: Payer: Self-pay | Admitting: Emergency Medicine

## 2007-04-18 IMAGING — CR DG CHEST 1V PORT
1 series · 1 of 1 positions shown · non-contrast
Comparison: none

REASON FOR EXAM: weakness
COMMENTS:

PROCEDURE:     DXR - DXR PORTABLE CHEST SINGLE VIEW  - September 14, 2005  [DATE]
RESULT:     Comparison is made to prior study dated 02-24-05.
The patient has taken a shallow inspiration; otherwise, the lungs are clear.
The cardiac silhouette and visualized bony skeleton are unremarkable.

[view not recorded]
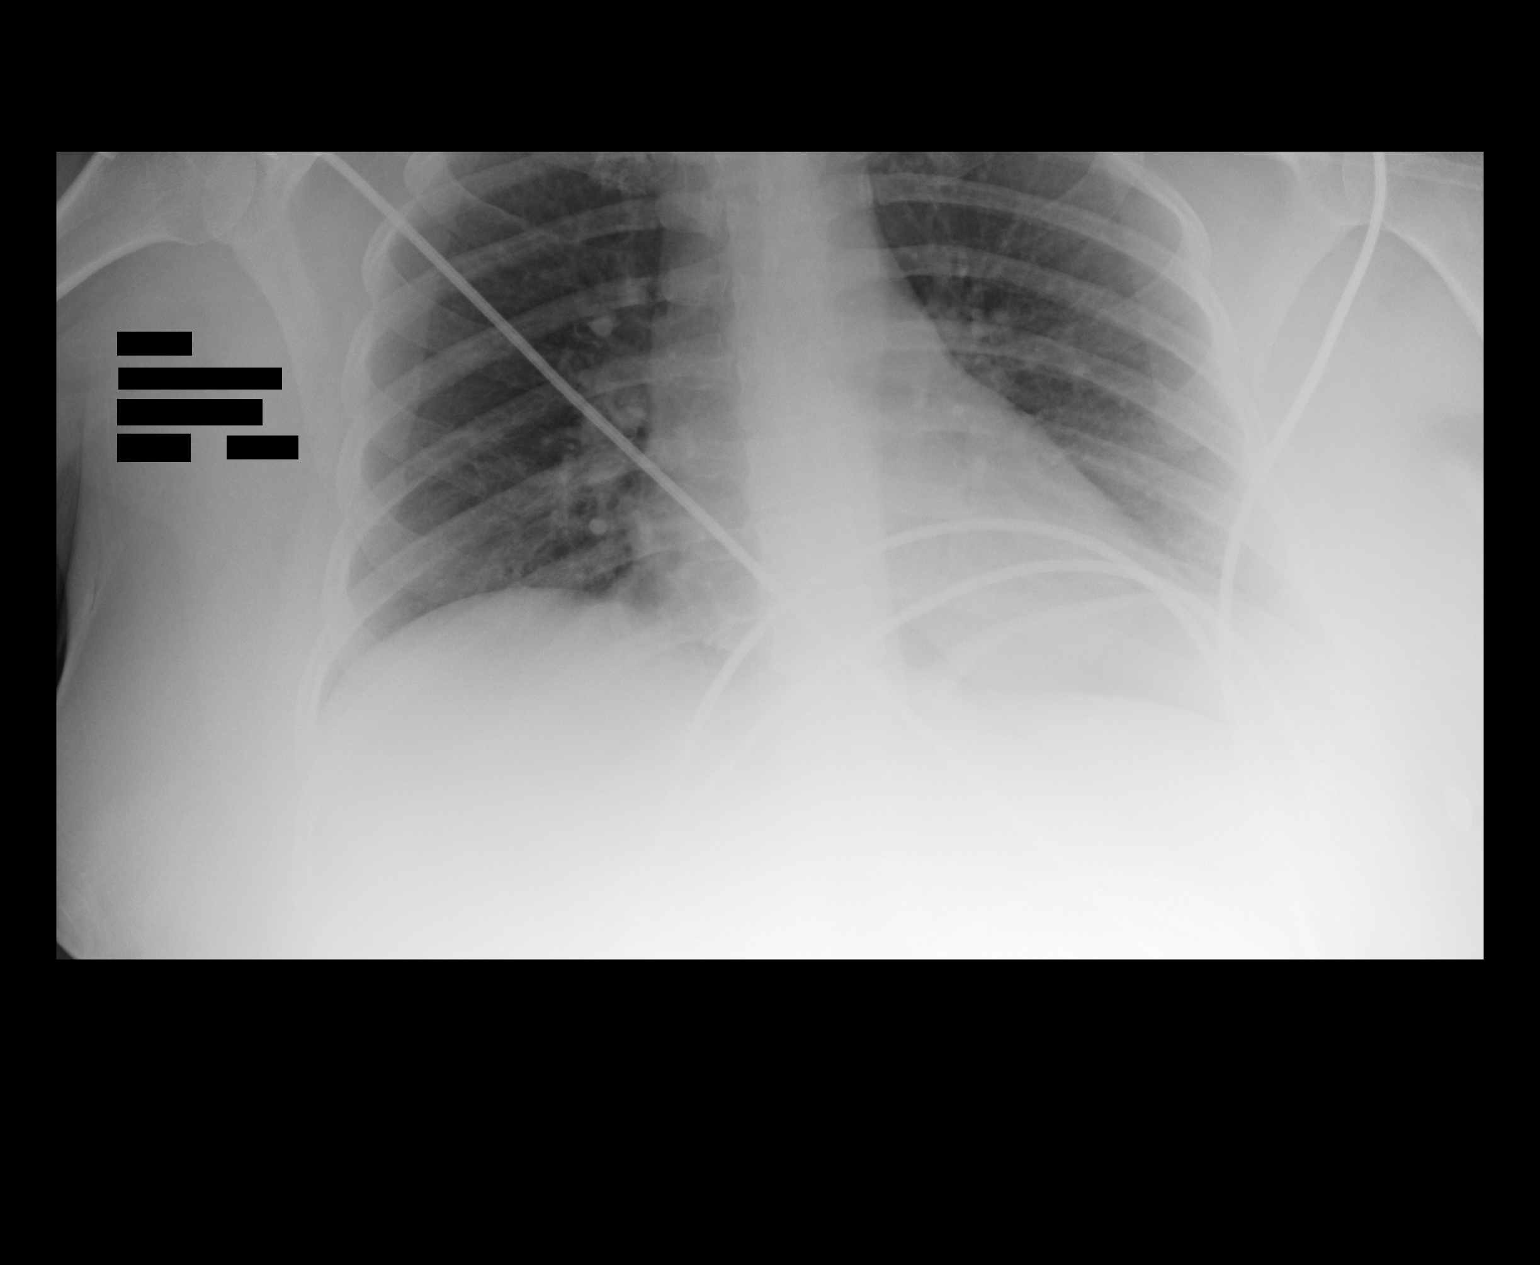

[1 of 1 positions shown; findings below may reference images not displayed]

IMPRESSION: 1)Chest radiograph without evidence of acute cardiopulmonary disease.

## 2007-05-04 ENCOUNTER — Emergency Department: Payer: Self-pay | Admitting: Emergency Medicine

## 2007-05-05 IMAGING — CR DG CHEST 2V
1 series · 2 of 2 positions shown · non-contrast
Comparison: none

REASON FOR EXAM: Elevated bs/room
COMMENTS:

PROCEDURE:     DXR - DXR CHEST PA (OR AP) AND LATERAL  - October 01, 2005  [DATE]
RESULT:       The lungs are clear.  Cardiovascular structures are
unremarkable.

[Series 1: view not recorded · 0.17mm/px · 2 of 2 slices shown]
[im 1/2]
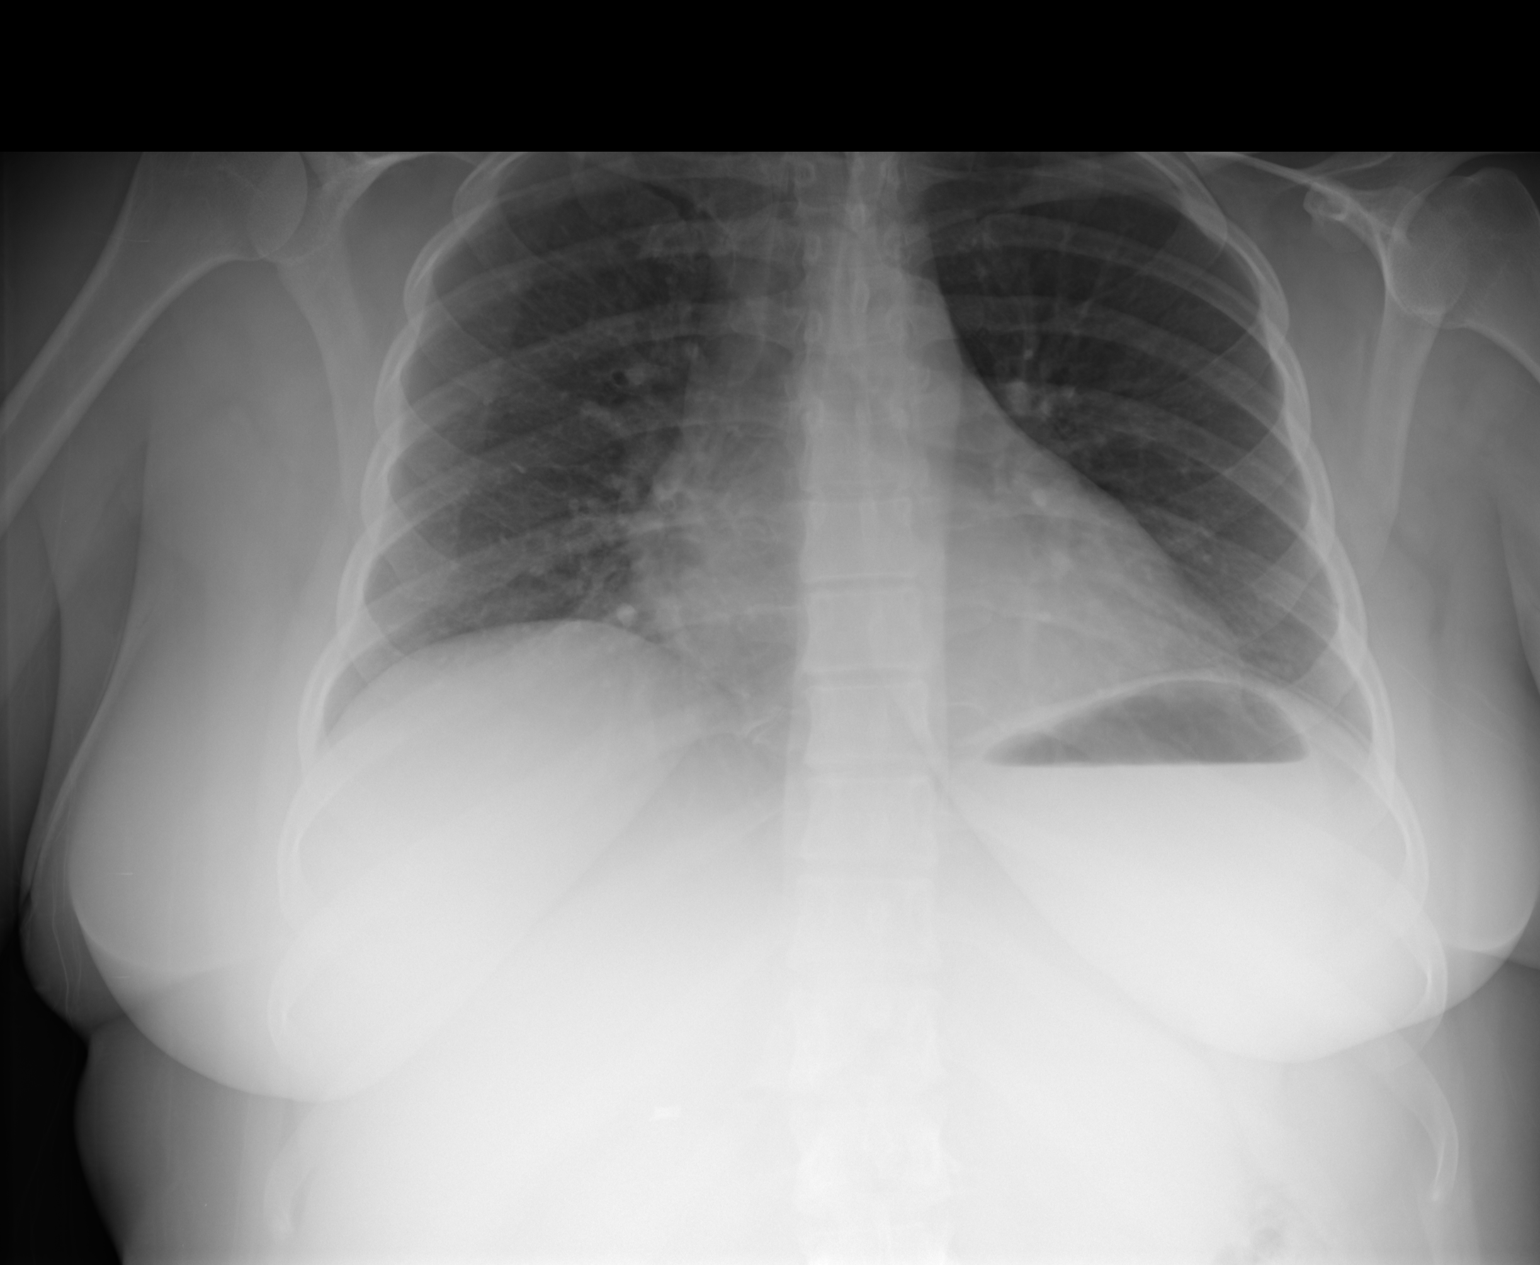
[im 2/2]
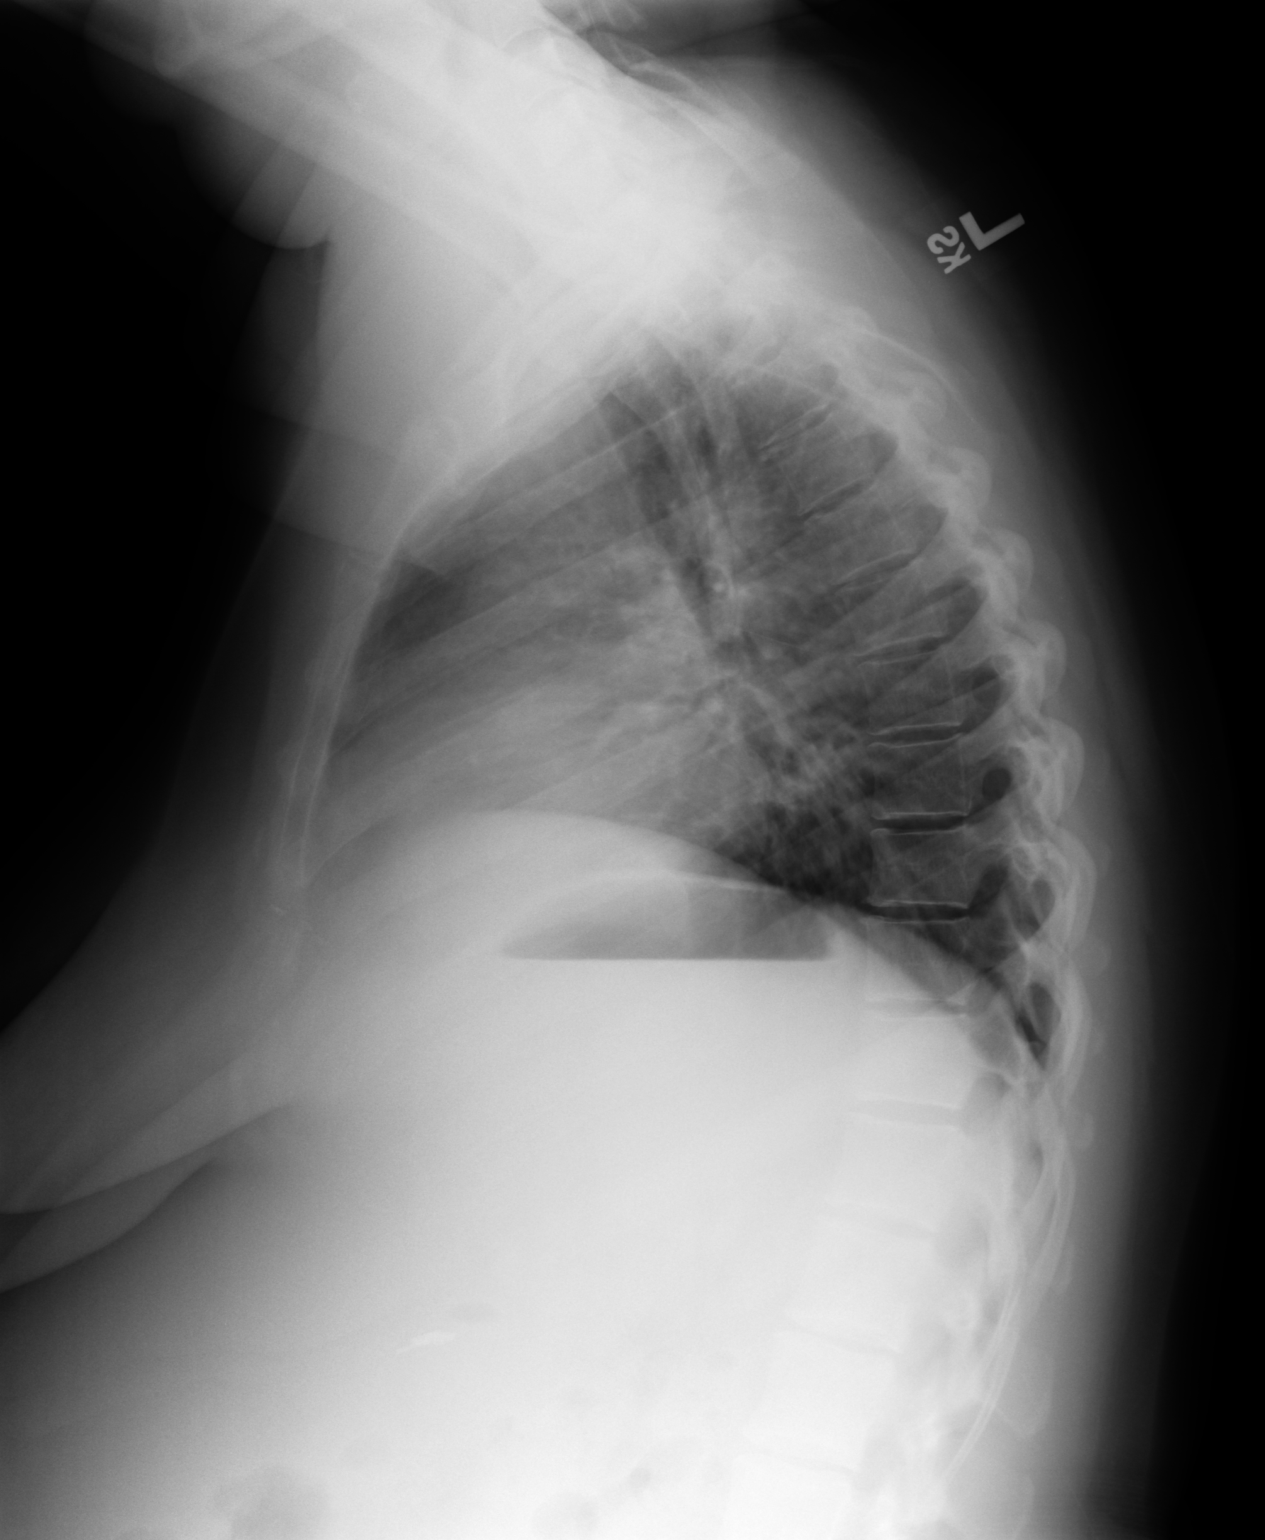

[2 of 2 positions shown; findings below may reference images not displayed]

IMPRESSION: No acute cardiopulmonary disease.

## 2007-05-05 IMAGING — CT CT HEAD WITHOUT CONTRAST
2 series · 16 of 30 positions shown, 20 images · non-contrast
Comparison: none

REASON FOR EXAM: Dizzy/room
COMMENTS:

PROCEDURE:     CT  - CT HEAD WITHOUT CONTRAST  - October 01, 2005  [DATE]
RESULT:       No intra-axial or extra-axial pathologic fluid or blood
collections are identified.  No mass lesion is noted.   There is no
hydrocephalus.

[Series 2: without · axial · non-contrast · 0.40mm/px · z∈[-192,-77]mm · 13 of 27 slices shown, 17 images]
[im 2/27  brain]
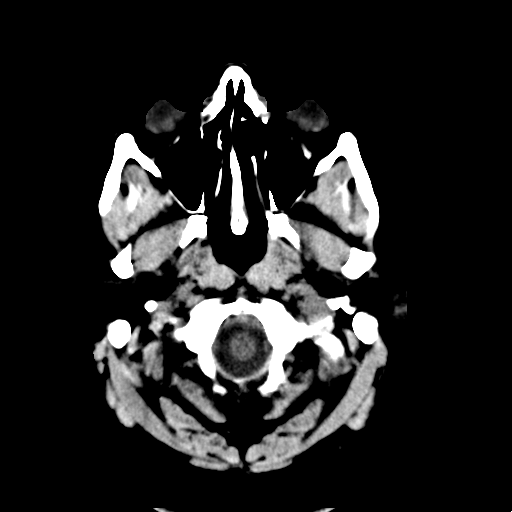
[im 2/27  bone]
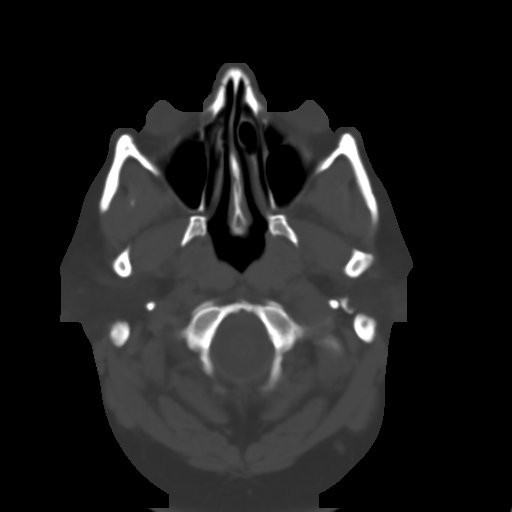
[im 4/27  brain]
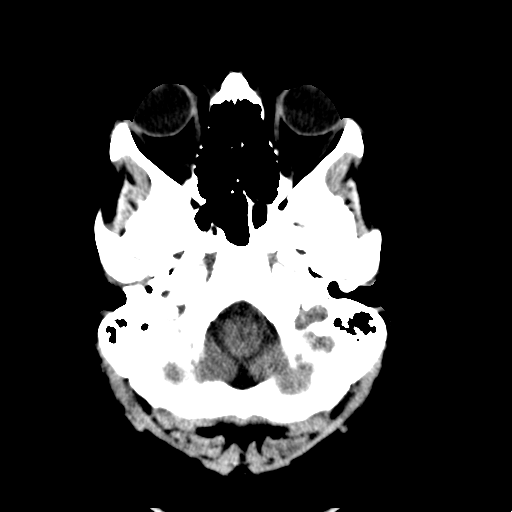
[im 6/27  brain]
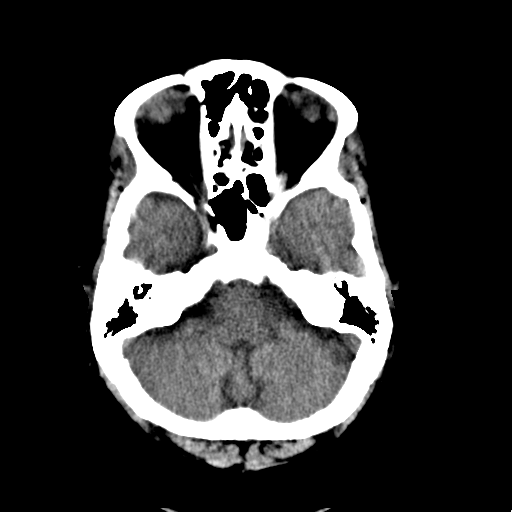
[im 8/27  brain]
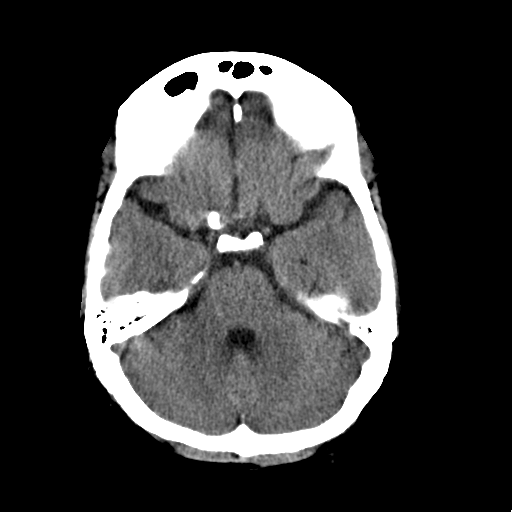
[im 10/27  brain]
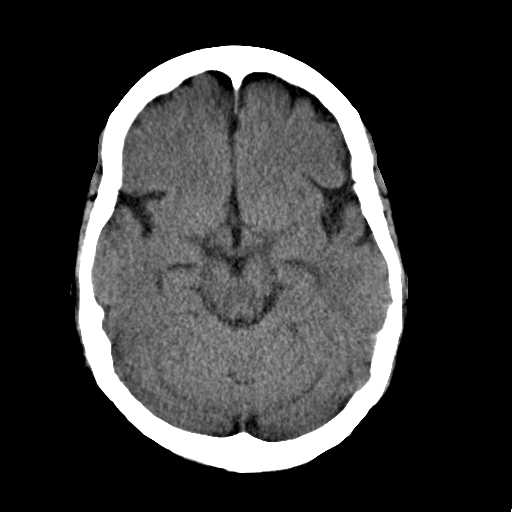
[im 10/27  bone]
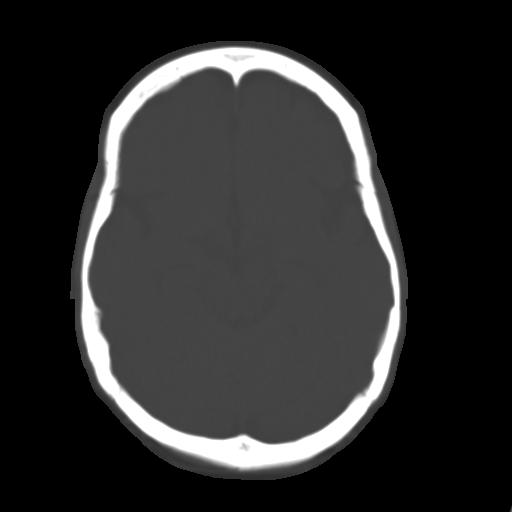
[im 12/27  brain]
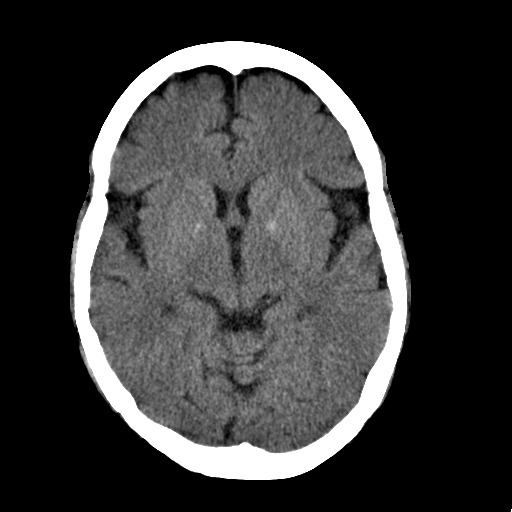
[im 14/27  brain]
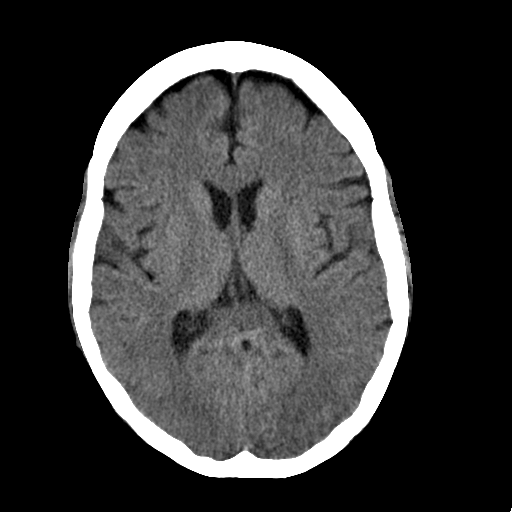
[im 15/27  brain]
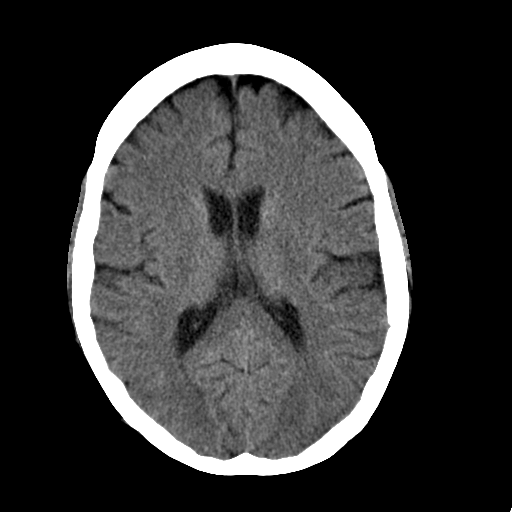
[im 17/27  brain]
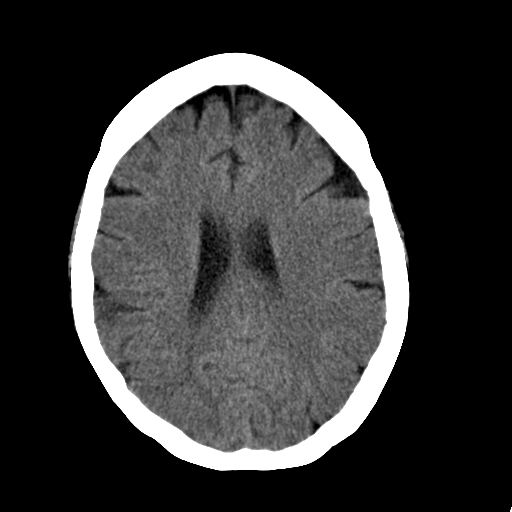
[im 17/27  bone]
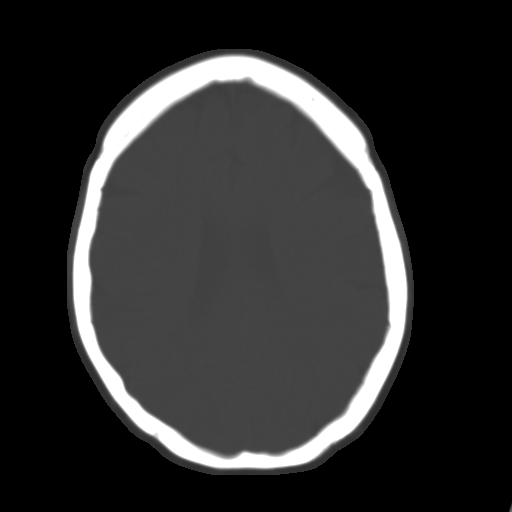
[im 19/27  brain]
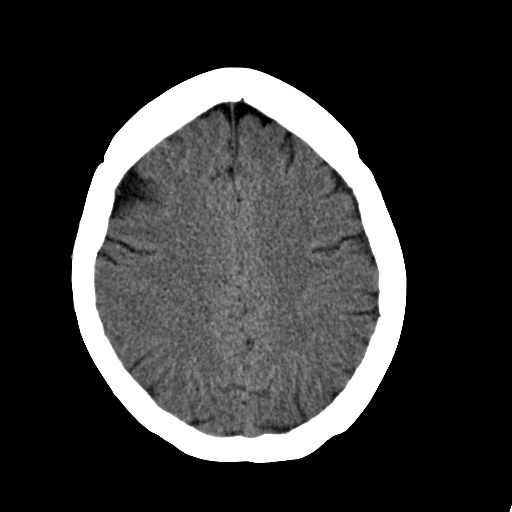
[im 21/27  brain]
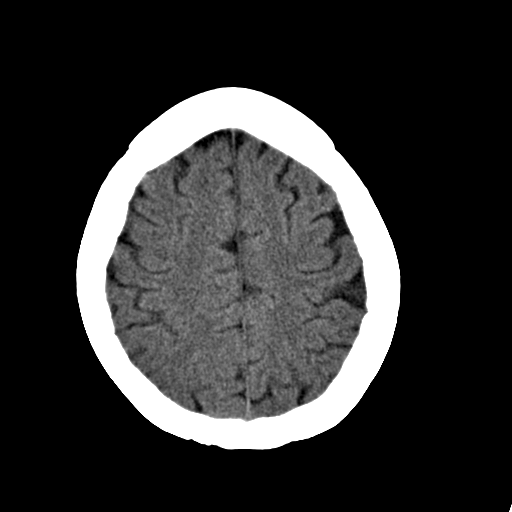
[im 23/27  brain]
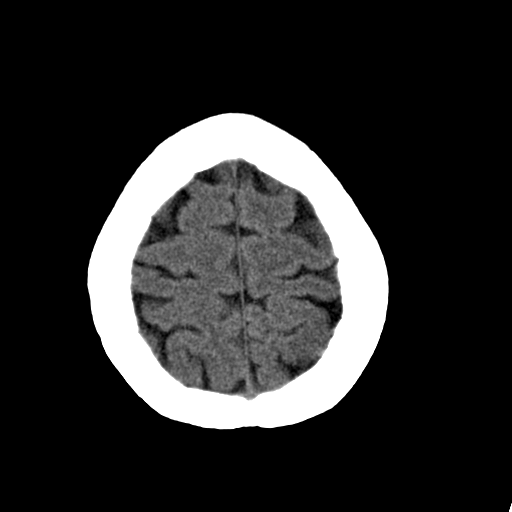
[im 25/27  brain]
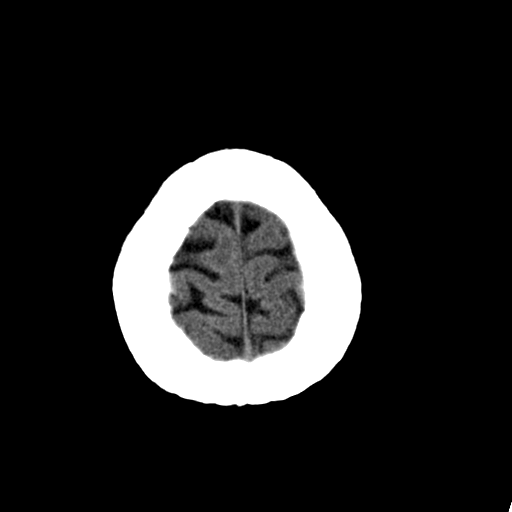
[im 25/27  bone]
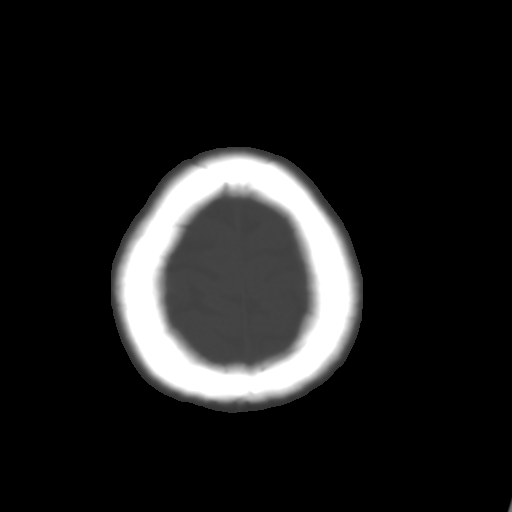

[Series 3: bone · axial · 0.40mm/px · z∈[-192,-152]mm · 3 of 27 slices shown]
[im 2/27  bone]
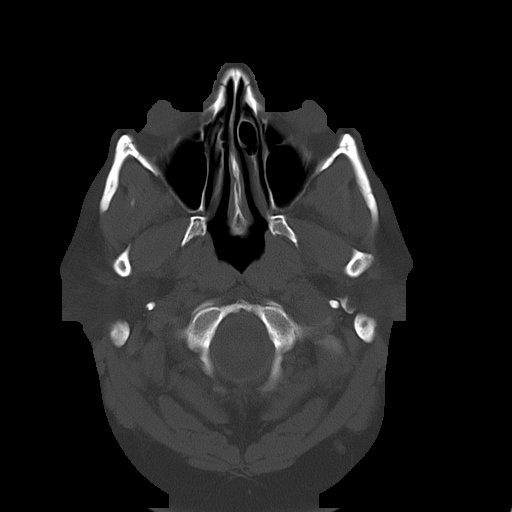
[im 6/27  bone]
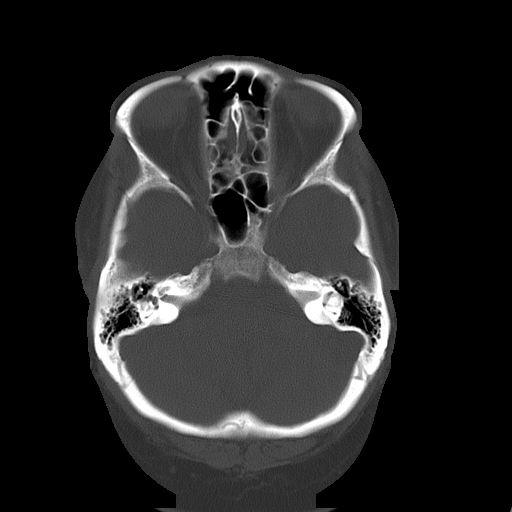
[im 10/27  bone]
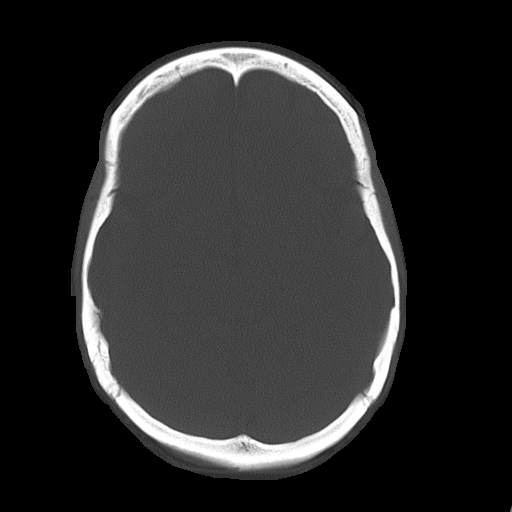

[16 of 30 positions shown; findings below may reference images not displayed]

IMPRESSION: 1.     No acute intracranial abnormalities identified.
2.     No bony abnormalities.

This report was phoned to the Emergency Room physician at the time of the
study.

## 2007-05-10 IMAGING — CR DG FOREARM 2V*L*
1 series · 3 of 3 positions shown · non-contrast
Comparison: none

REASON FOR EXAM: Fall
COMMENTS:  LMP: One week ago

PROCEDURE:     DXR - DXR FOREARM LEFT  - October 06, 2005  [DATE]
RESULT:          There does not appear to be evidence of a fracture,
dislocation, or malalignment.

[Series 1: view not recorded · 0.17mm/px · 3 of 3 slices shown]
[im 1/3]
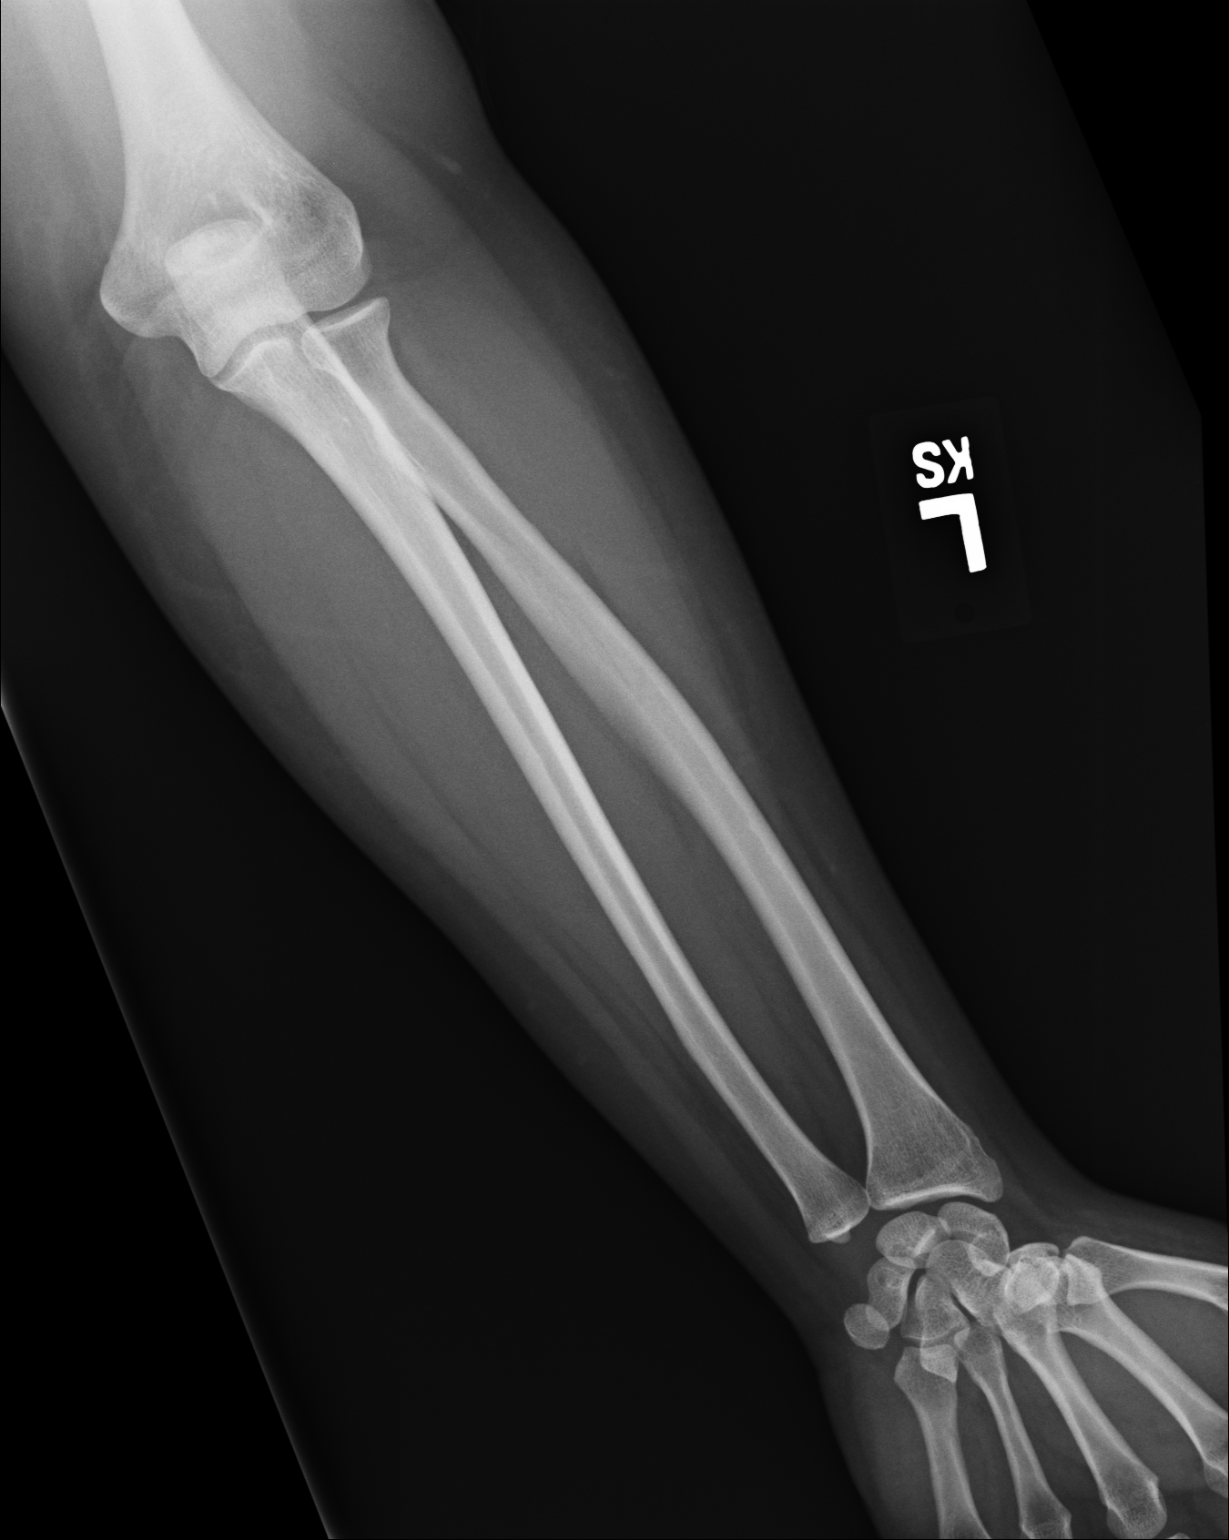
[im 2/3]
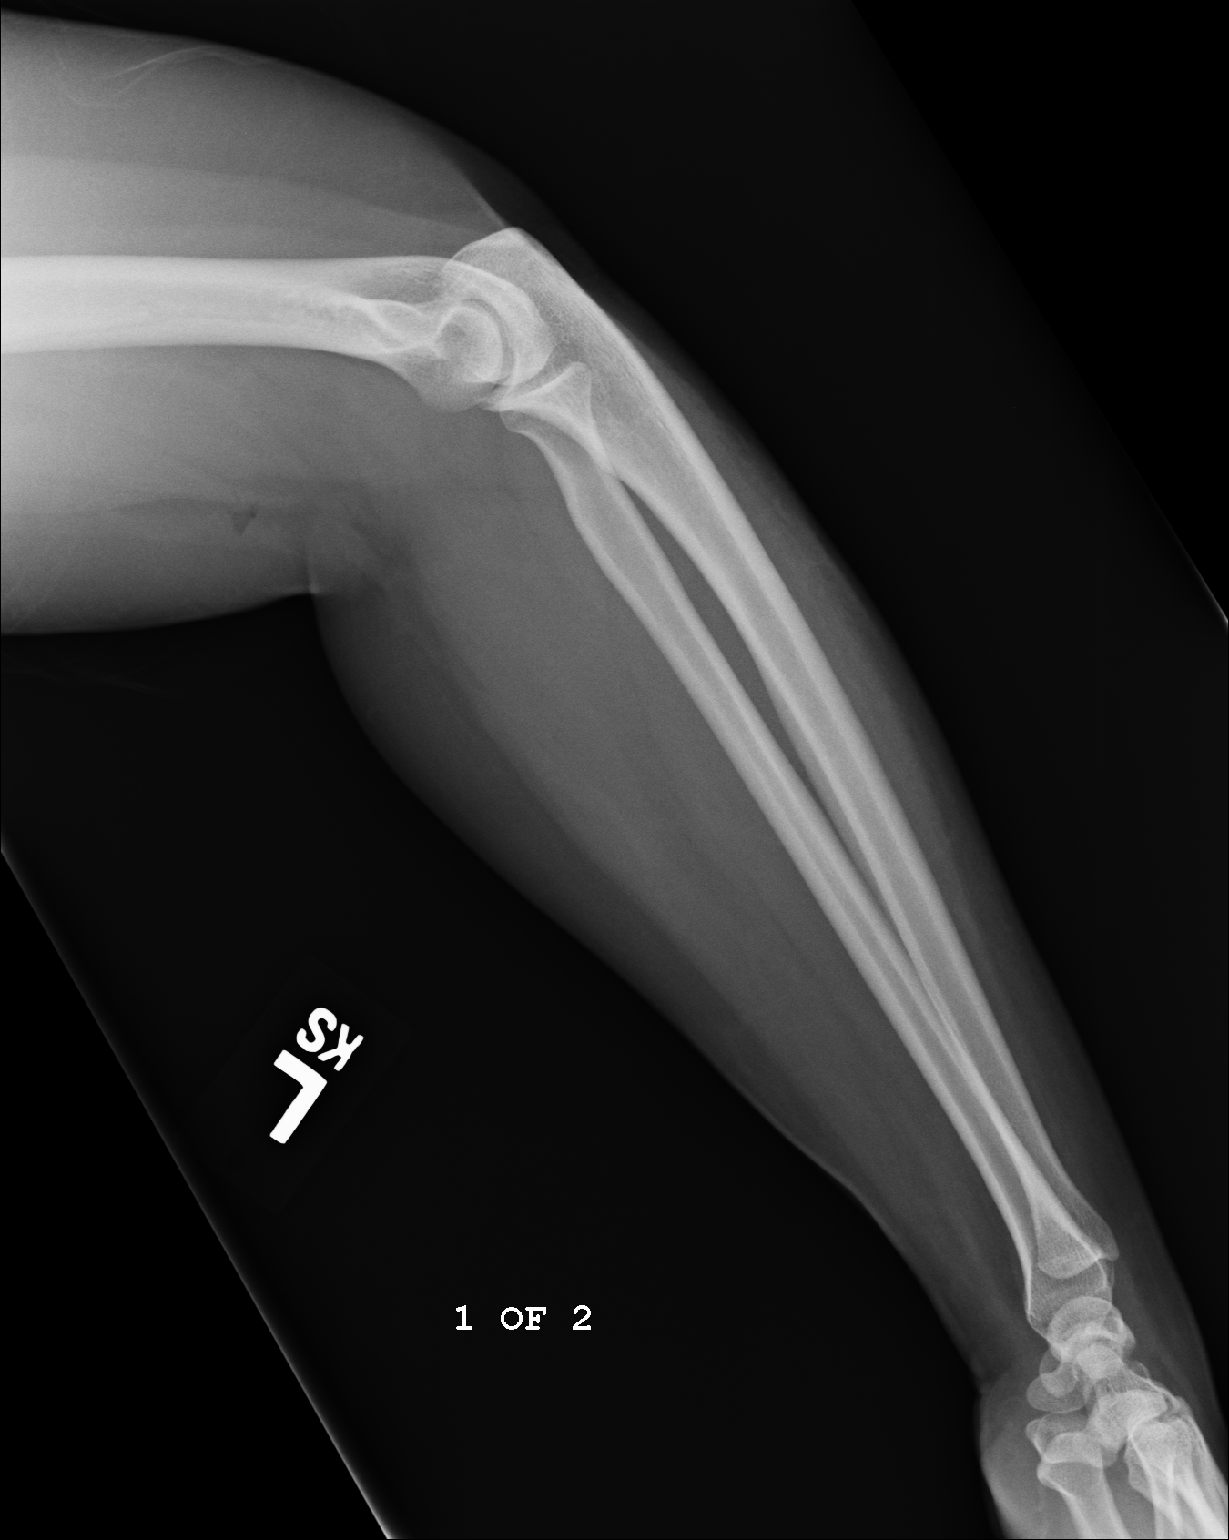
[im 3/3]
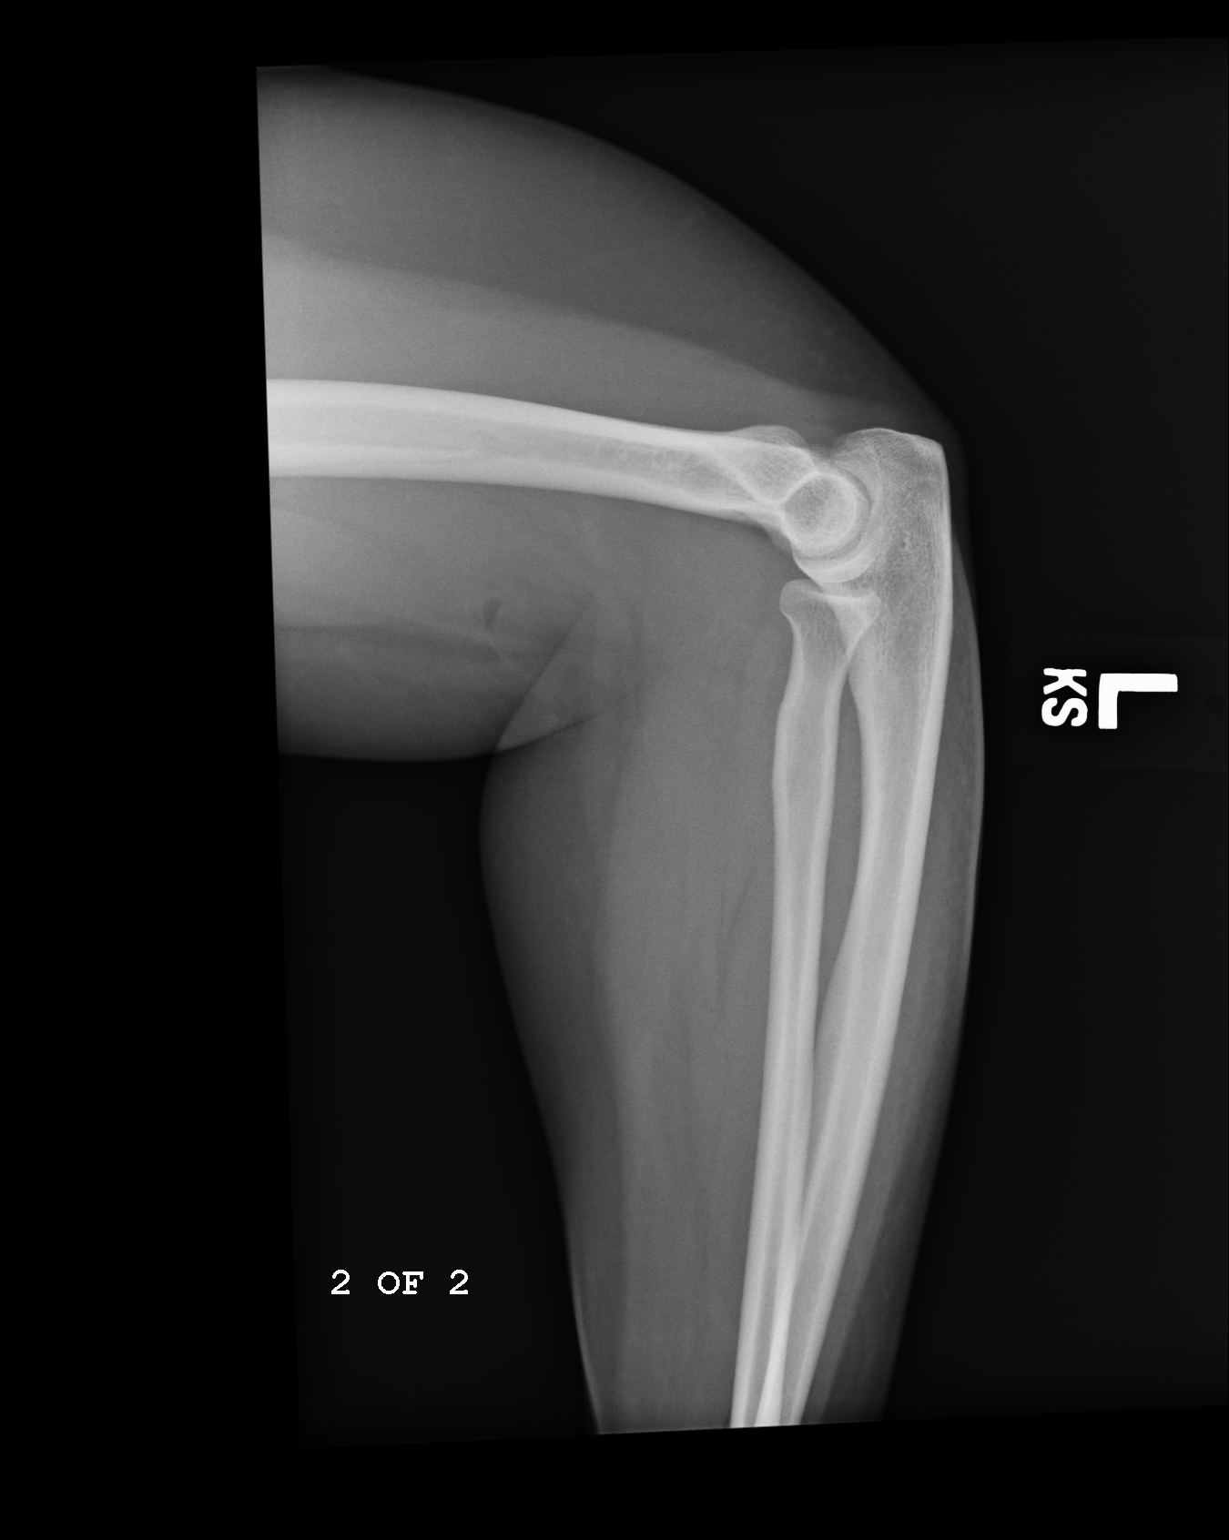

[3 of 3 positions shown; findings below may reference images not displayed]

IMPRESSION: 1.     No evidence of acute abnormalities of the LEFT radius and ulna .
2.     If there is persistent clinical concern or persistent complaints of
pain, repeat evaluation in 7-10 days is recommended if clinically warranted.

## 2007-05-19 IMAGING — CR DG CHEST 2V
1 series · 3 of 3 positions shown · non-contrast
Comparison: none

REASON FOR EXAM: Chest pain
COMMENTS:

PROCEDURE:     DXR - DXR CHEST PA (OR AP) AND LATERAL  - October 15, 2005  [DATE]
RESULT:     The current exam is compared to a prior exam of 10/01/2005.
The lung fields are clear. The heart, mediastinal and osseous structures
show no significant abnormalities.

[Series 3787: postero_anterior · 0.22mm/px · 3 of 3 slices shown]
[im 1/3]
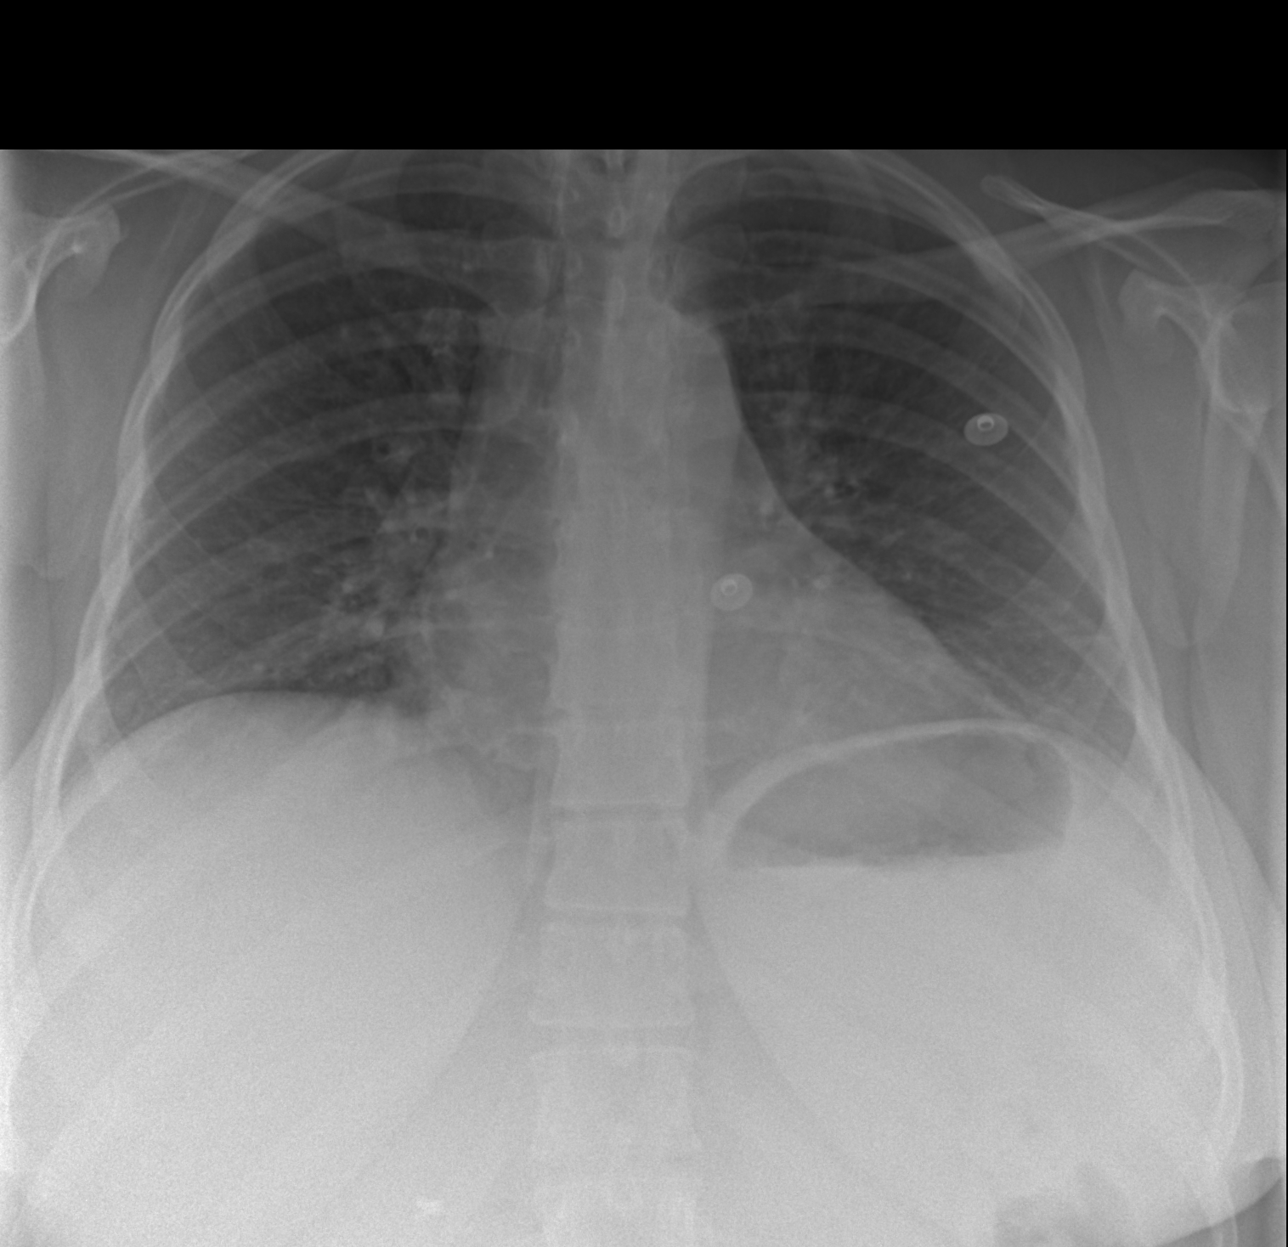
[im 2/3]
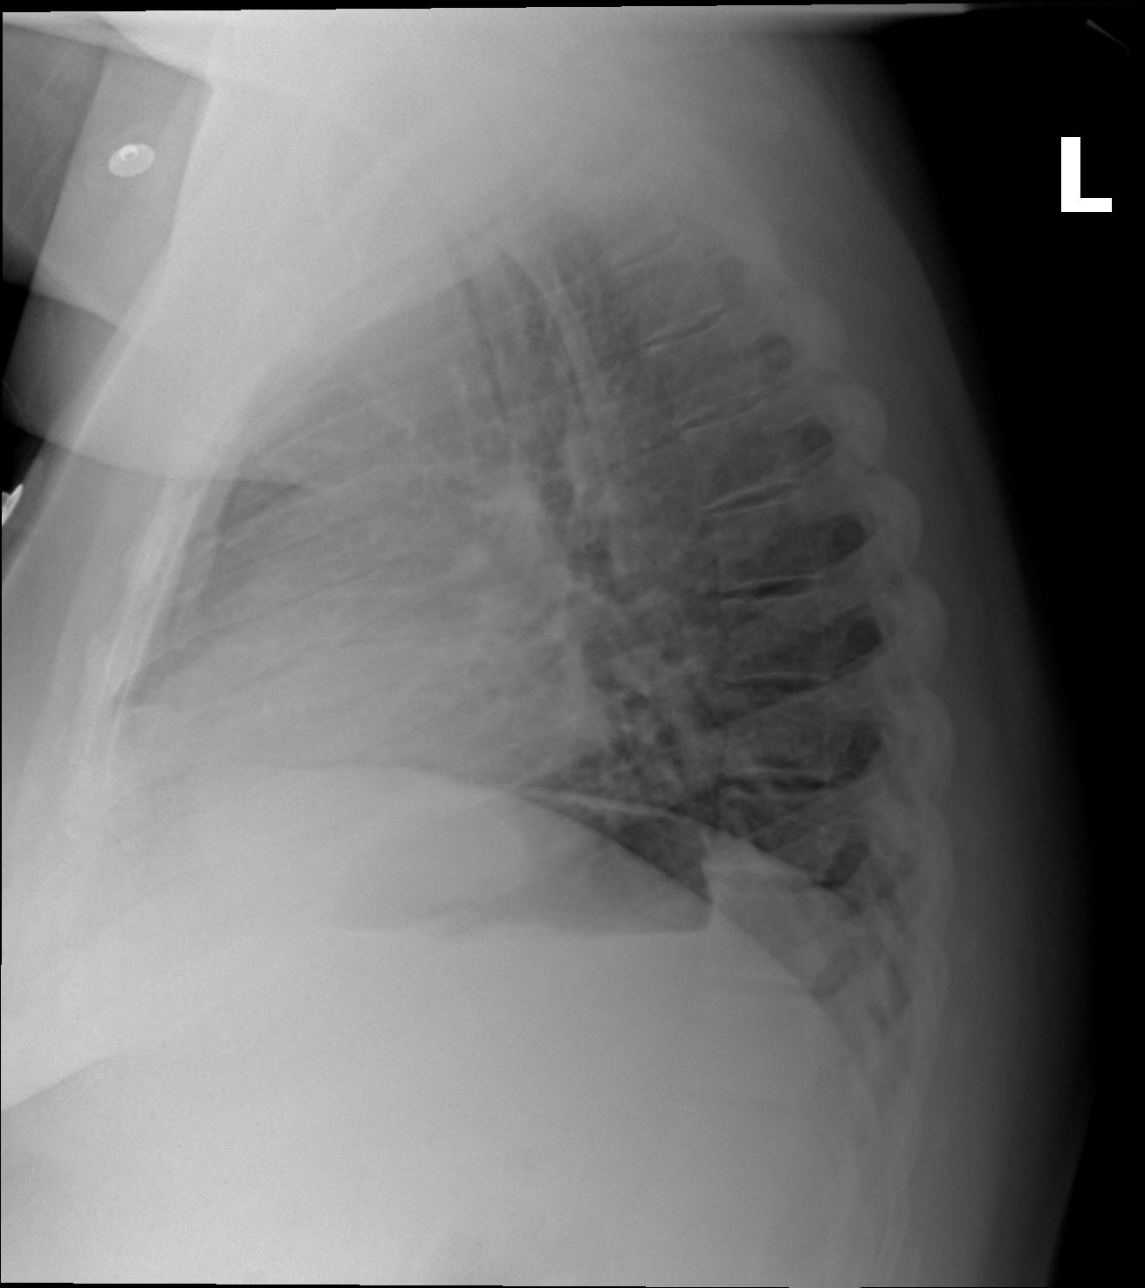
[im 3/3]
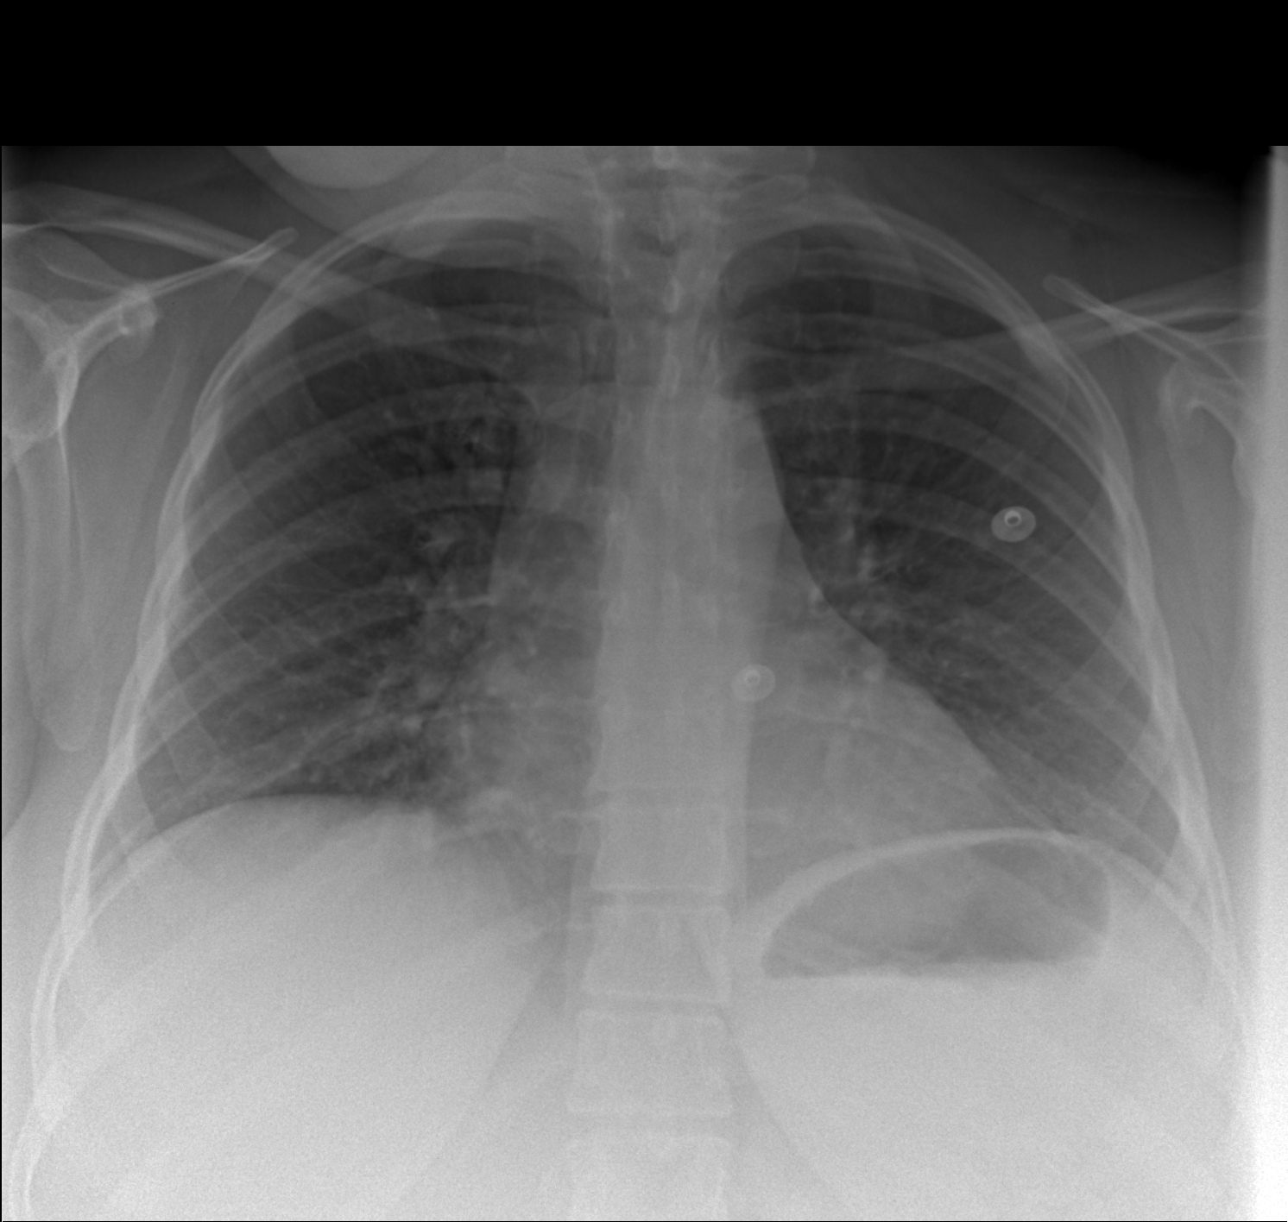

[3 of 3 positions shown; findings below may reference images not displayed]

IMPRESSION: No acute changes are identified.

## 2007-05-20 ENCOUNTER — Emergency Department: Payer: Self-pay | Admitting: Emergency Medicine

## 2007-06-07 ENCOUNTER — Emergency Department: Payer: Self-pay | Admitting: Emergency Medicine

## 2007-06-15 ENCOUNTER — Emergency Department: Payer: Self-pay | Admitting: Emergency Medicine

## 2007-06-19 ENCOUNTER — Encounter (INDEPENDENT_AMBULATORY_CARE_PROVIDER_SITE_OTHER): Payer: Self-pay | Admitting: Internal Medicine

## 2007-06-19 ENCOUNTER — Ambulatory Visit: Payer: Self-pay | Admitting: Family Medicine

## 2007-06-19 ENCOUNTER — Telehealth (INDEPENDENT_AMBULATORY_CARE_PROVIDER_SITE_OTHER): Payer: Self-pay | Admitting: *Deleted

## 2007-06-19 DIAGNOSIS — E119 Type 2 diabetes mellitus without complications: Secondary | ICD-10-CM | POA: Insufficient documentation

## 2007-06-19 DIAGNOSIS — I10 Essential (primary) hypertension: Secondary | ICD-10-CM | POA: Insufficient documentation

## 2007-07-03 ENCOUNTER — Ambulatory Visit: Payer: Self-pay | Admitting: Family Medicine

## 2007-07-03 DIAGNOSIS — R5383 Other fatigue: Secondary | ICD-10-CM

## 2007-07-03 DIAGNOSIS — Z8659 Personal history of other mental and behavioral disorders: Secondary | ICD-10-CM | POA: Insufficient documentation

## 2007-07-03 DIAGNOSIS — R5381 Other malaise: Secondary | ICD-10-CM | POA: Insufficient documentation

## 2007-07-03 DIAGNOSIS — E781 Pure hyperglyceridemia: Secondary | ICD-10-CM | POA: Insufficient documentation

## 2007-07-04 ENCOUNTER — Encounter: Payer: Self-pay | Admitting: Family Medicine

## 2007-07-04 DIAGNOSIS — E039 Hypothyroidism, unspecified: Secondary | ICD-10-CM | POA: Insufficient documentation

## 2007-07-04 DIAGNOSIS — J45909 Unspecified asthma, uncomplicated: Secondary | ICD-10-CM

## 2007-07-04 DIAGNOSIS — N943 Premenstrual tension syndrome: Secondary | ICD-10-CM | POA: Insufficient documentation

## 2007-07-04 DIAGNOSIS — N3010 Interstitial cystitis (chronic) without hematuria: Secondary | ICD-10-CM | POA: Insufficient documentation

## 2007-07-04 DIAGNOSIS — J309 Allergic rhinitis, unspecified: Secondary | ICD-10-CM | POA: Insufficient documentation

## 2007-07-04 LAB — CONVERTED CEMR LAB
AST: 17 units/L (ref 0–37)
Bilirubin, Direct: 0.1 mg/dL (ref 0.0–0.3)
CO2: 26 meq/L (ref 19–32)
Chloride: 103 meq/L (ref 96–112)
Cholesterol: 197 mg/dL (ref 0–200)
Creatinine, Ser: 0.9 mg/dL (ref 0.4–1.2)
Creatinine,U: 177.7 mg/dL
Glucose, Bld: 107 mg/dL — ABNORMAL HIGH (ref 70–99)
HDL: 39 mg/dL (ref >39.0)
Lithium Lvl: 1.5 meq/L — ABNORMAL HIGH (ref 0.80–1.40)
Microalb, Ur: 0.7 mg/dL (ref 0.0–1.9)
Potassium: 4.5 meq/L (ref 3.5–5.1)
Sodium: 137 meq/L (ref 135–145)
TSH: 6.93 microintl units/mL — ABNORMAL HIGH (ref 0.35–5.50)
Total Bilirubin: 0.4 mg/dL (ref 0.3–1.2)
Total Protein: 6.2 g/dL (ref 6.0–8.3)
VLDL: 57 mg/dL — ABNORMAL HIGH (ref 0–40)
Valproic Acid Lvl: 75.6 ug/mL (ref 50.0–100.0)

## 2007-07-17 ENCOUNTER — Ambulatory Visit: Payer: Self-pay | Admitting: Family Medicine

## 2007-07-19 LAB — CONVERTED CEMR LAB
AST: 21 units/L (ref 0–37)
Albumin: 3.7 g/dL (ref 3.5–5.2)
Alkaline Phosphatase: 46 units/L (ref 39–117)
Lipase: 19 units/L (ref 11.0–59.0)
Total Bilirubin: 0.6 mg/dL (ref 0.3–1.2)

## 2007-07-25 ENCOUNTER — Telehealth: Payer: Self-pay | Admitting: Family Medicine

## 2007-08-11 ENCOUNTER — Emergency Department: Payer: Self-pay | Admitting: Emergency Medicine

## 2007-08-11 IMAGING — CR DG CHEST 2V
1 series · 2 of 2 positions shown · non-contrast
Comparison: none

REASON FOR EXAM: Asthma
COMMENTS:

PROCEDURE:     DXR - DXR CHEST PA (OR AP) AND LATERAL  - January 07, 2006  [DATE]
RESULT:     The current exam is compared to a prior exam of 10/15/2005.
The lung fields remain clear. The heart, mediastinal and osseous structures
show no significant abnormalities.

[Series 5095: lateral · 0.11mm/px · 2 of 2 slices shown]
[im 1/2]
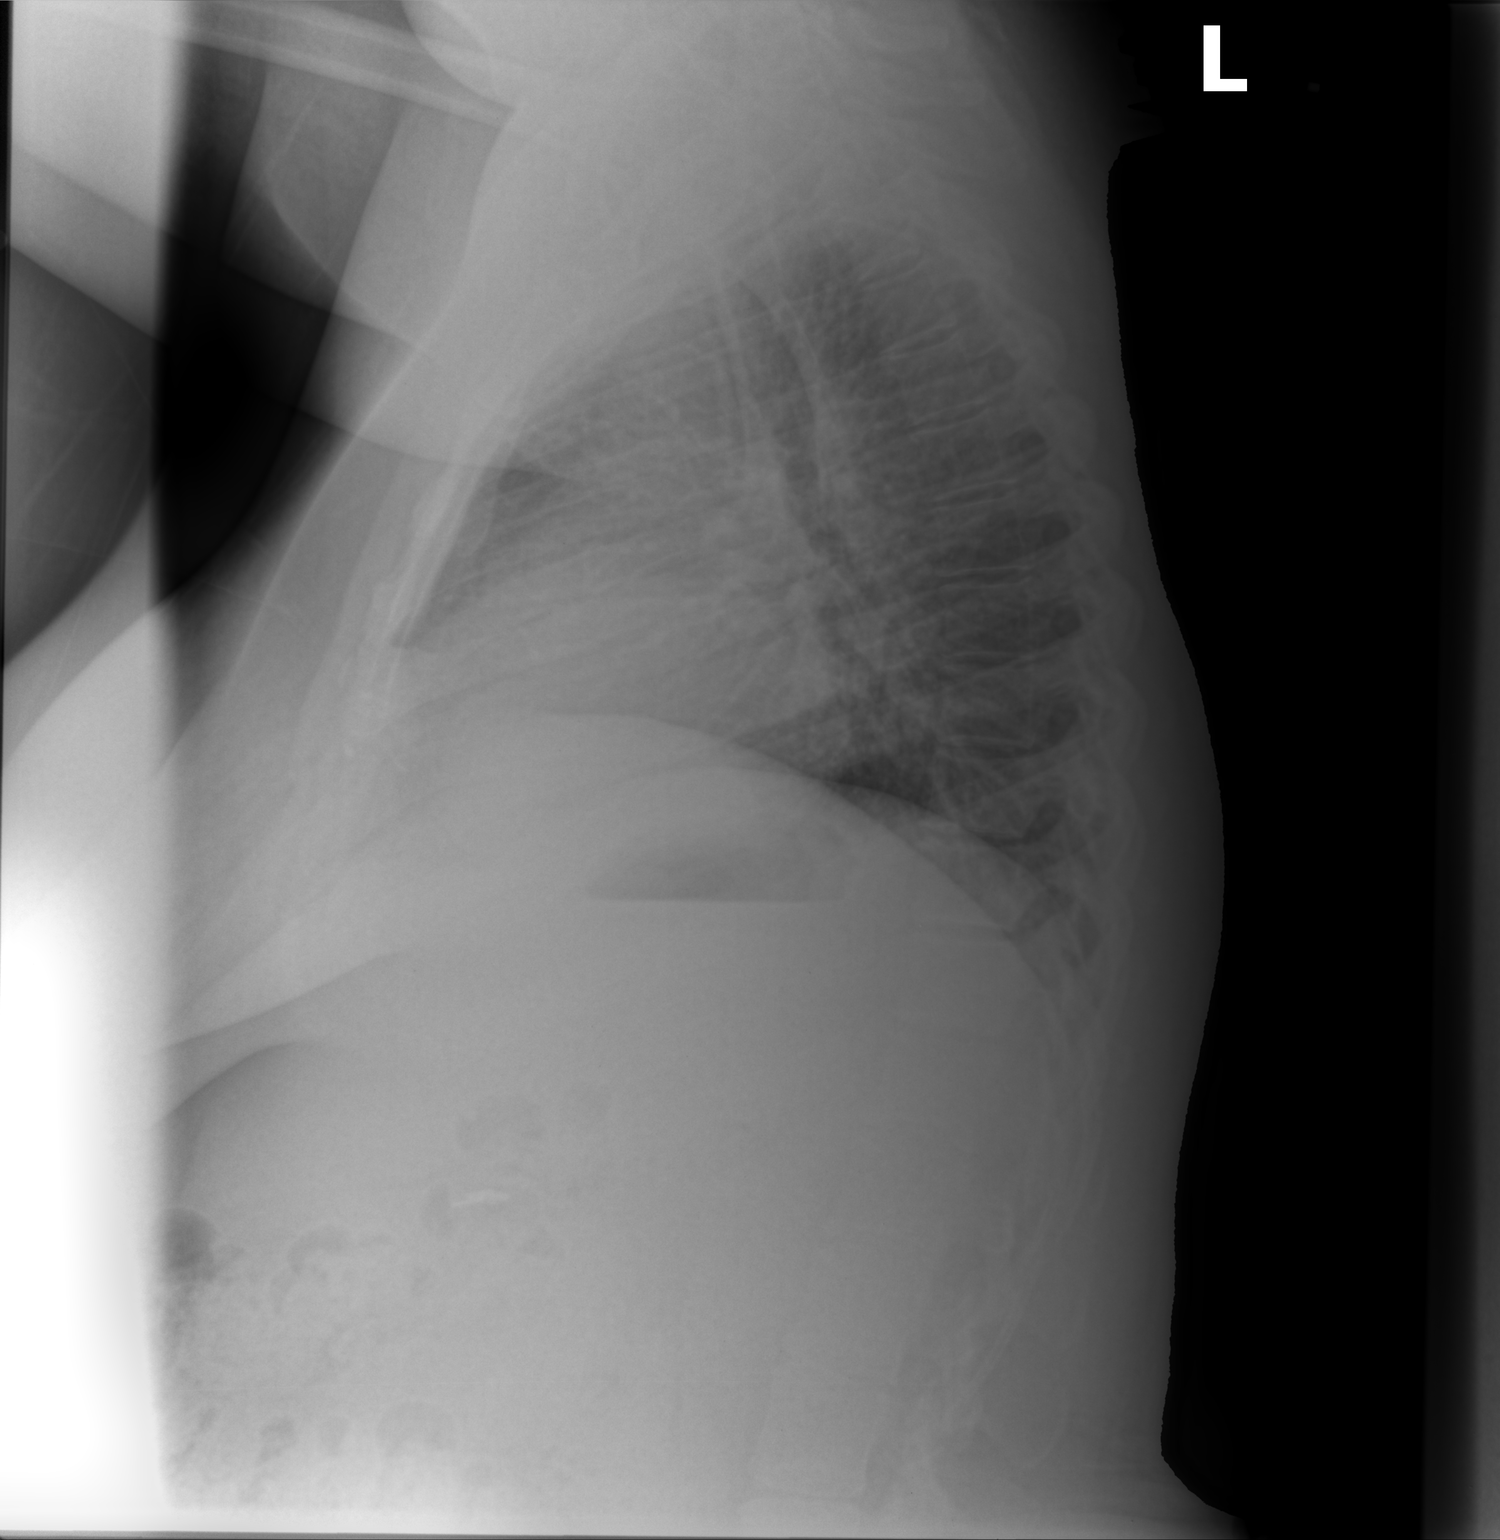
[im 2/2]
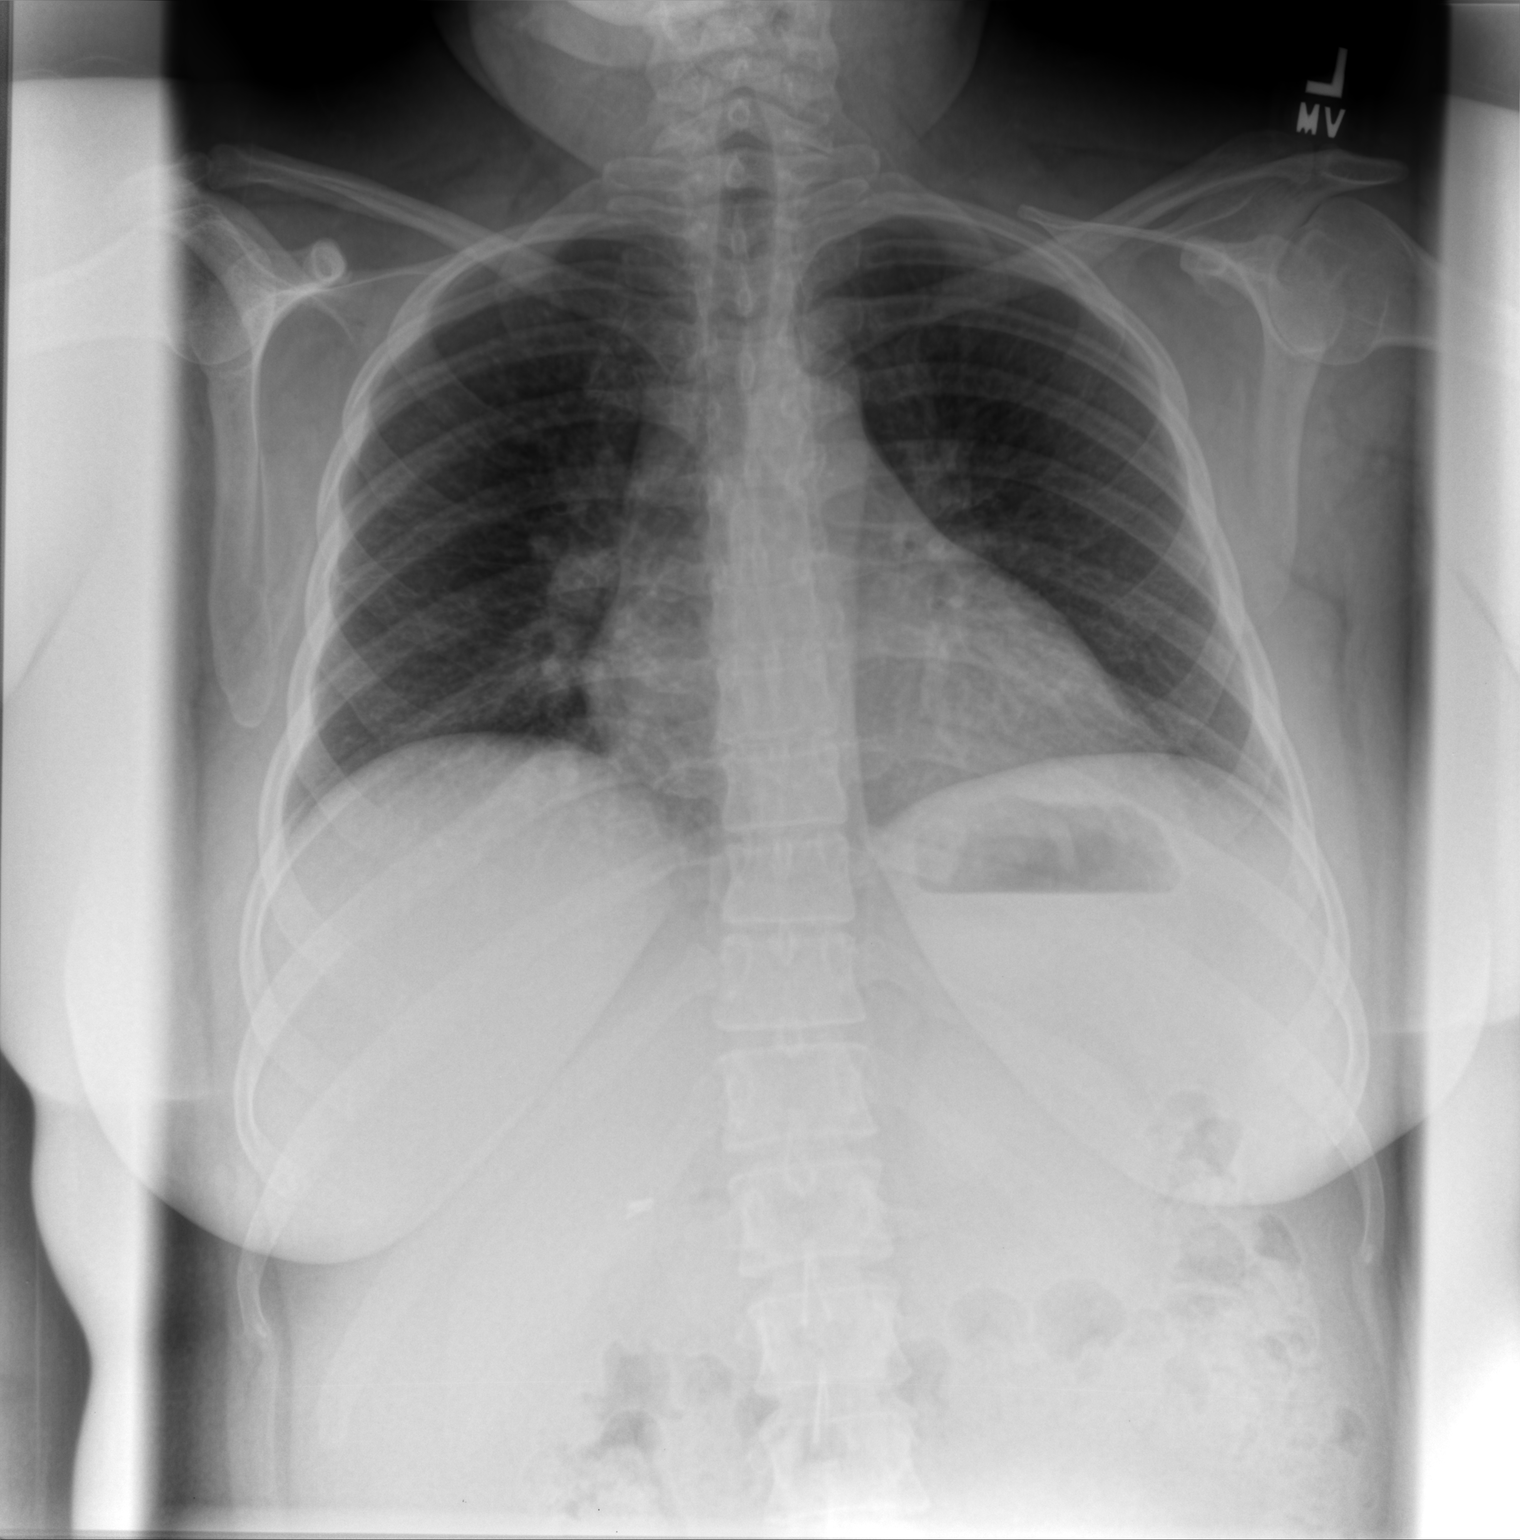

[2 of 2 positions shown; findings below may reference images not displayed]

IMPRESSION: No significant abnormalities are noted.

## 2007-09-05 DIAGNOSIS — I219 Acute myocardial infarction, unspecified: Secondary | ICD-10-CM

## 2007-09-05 HISTORY — DX: Acute myocardial infarction, unspecified: I21.9

## 2007-09-07 ENCOUNTER — Other Ambulatory Visit: Payer: Self-pay

## 2007-09-07 ENCOUNTER — Emergency Department: Payer: Self-pay | Admitting: Emergency Medicine

## 2007-10-14 ENCOUNTER — Ambulatory Visit: Payer: Self-pay | Admitting: Family Medicine

## 2007-10-18 ENCOUNTER — Ambulatory Visit: Payer: Self-pay | Admitting: Family Medicine

## 2007-10-21 LAB — CONVERTED CEMR LAB
AST: 17 units/L (ref 0–37)
Albumin: 4 g/dL (ref 3.5–5.2)
Bilirubin, Direct: 0.1 mg/dL (ref 0.0–0.3)
Calcium: 9.9 mg/dL (ref 8.4–10.5)
Chloride: 103 meq/L (ref 96–112)
Cholesterol: 144 mg/dL (ref 0–200)
GFR calc Af Amer: 69 mL/min
GFR calc non Af Amer: 57 mL/min
Glucose, Bld: 111 mg/dL — ABNORMAL HIGH (ref 70–99)
Hgb A1c MFr Bld: 6.5 % — ABNORMAL HIGH (ref 4.6–6.0)
Sodium: 136 meq/L (ref 135–145)
Total CHOL/HDL Ratio: 5
Triglycerides: 255 mg/dL (ref 0–149)
VLDL: 51 mg/dL — ABNORMAL HIGH (ref 0–40)

## 2007-10-23 ENCOUNTER — Encounter (INDEPENDENT_AMBULATORY_CARE_PROVIDER_SITE_OTHER): Payer: Self-pay | Admitting: *Deleted

## 2007-11-08 IMAGING — CR DG CHEST 2V
1 series · 2 of 2 positions shown · non-contrast
Comparison: none

REASON FOR EXAM: weakness
COMMENTS:  LMP: One week ago

[Series 1: view not recorded · 0.17mm/px · 2 of 2 slices shown]
[im 1/2]
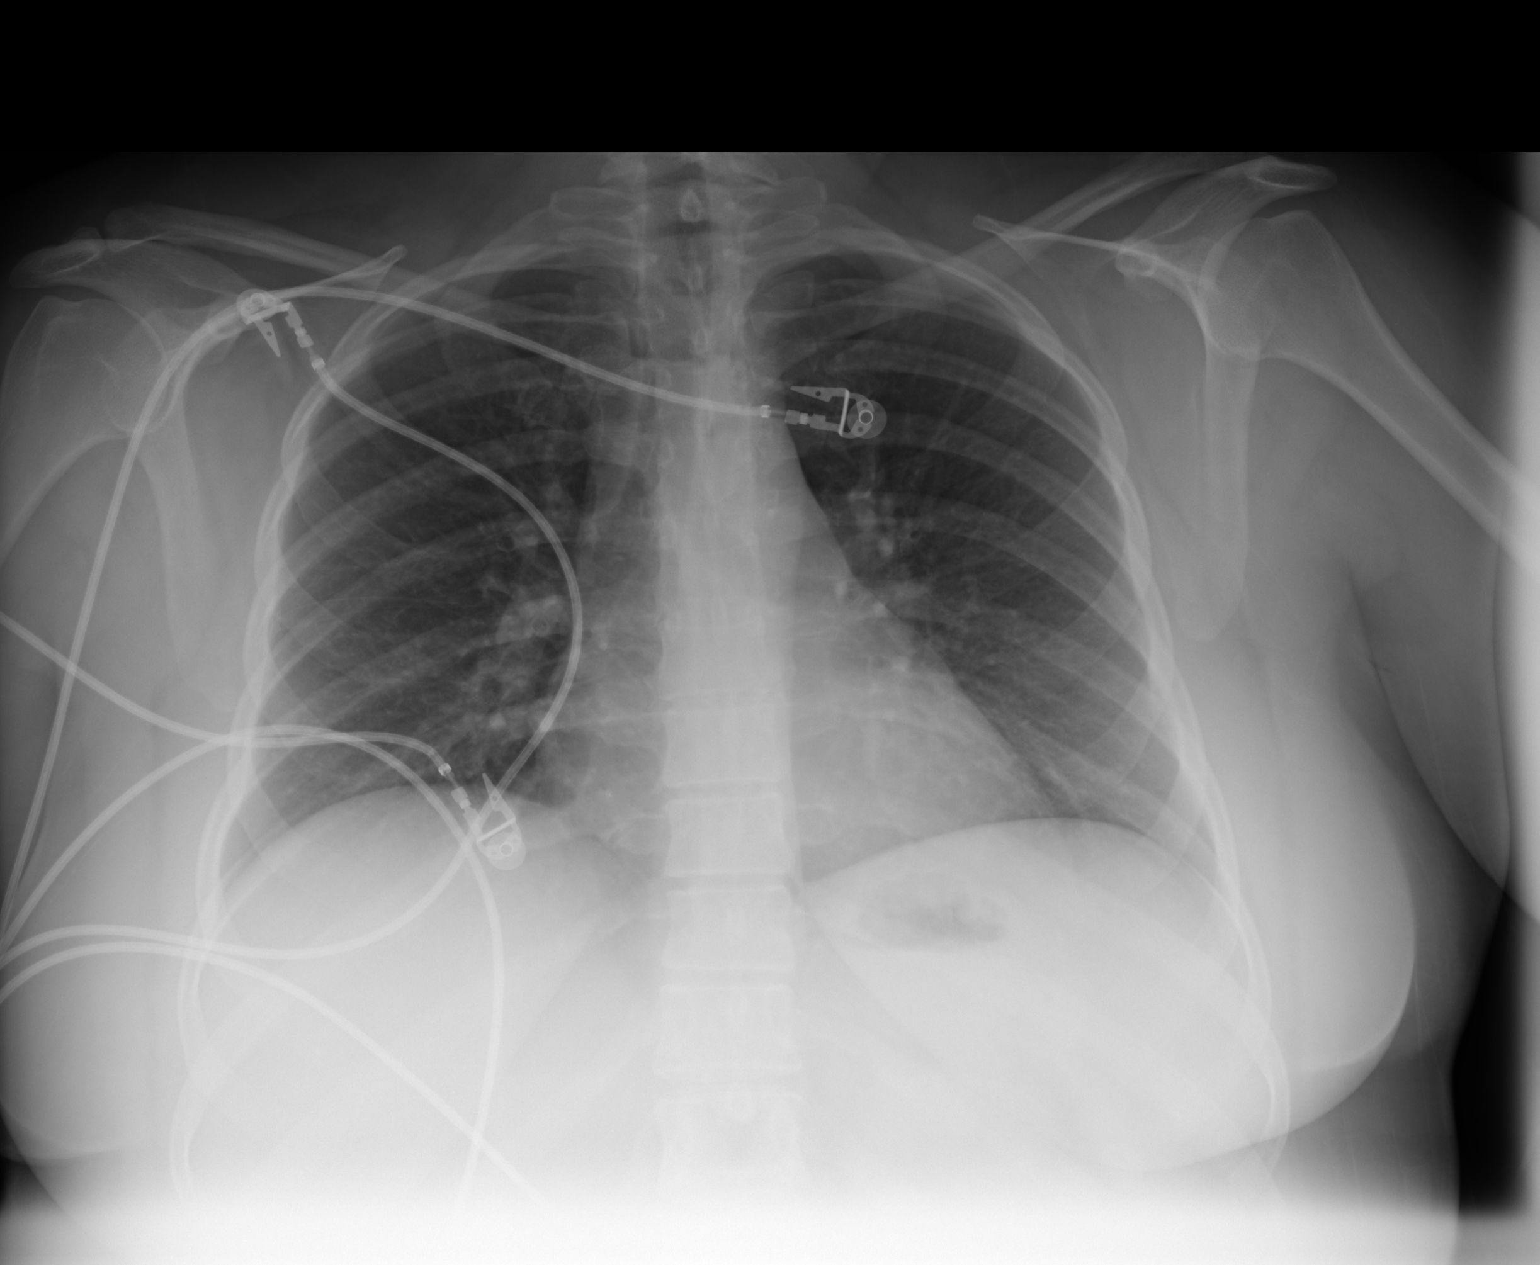
[im 2/2]
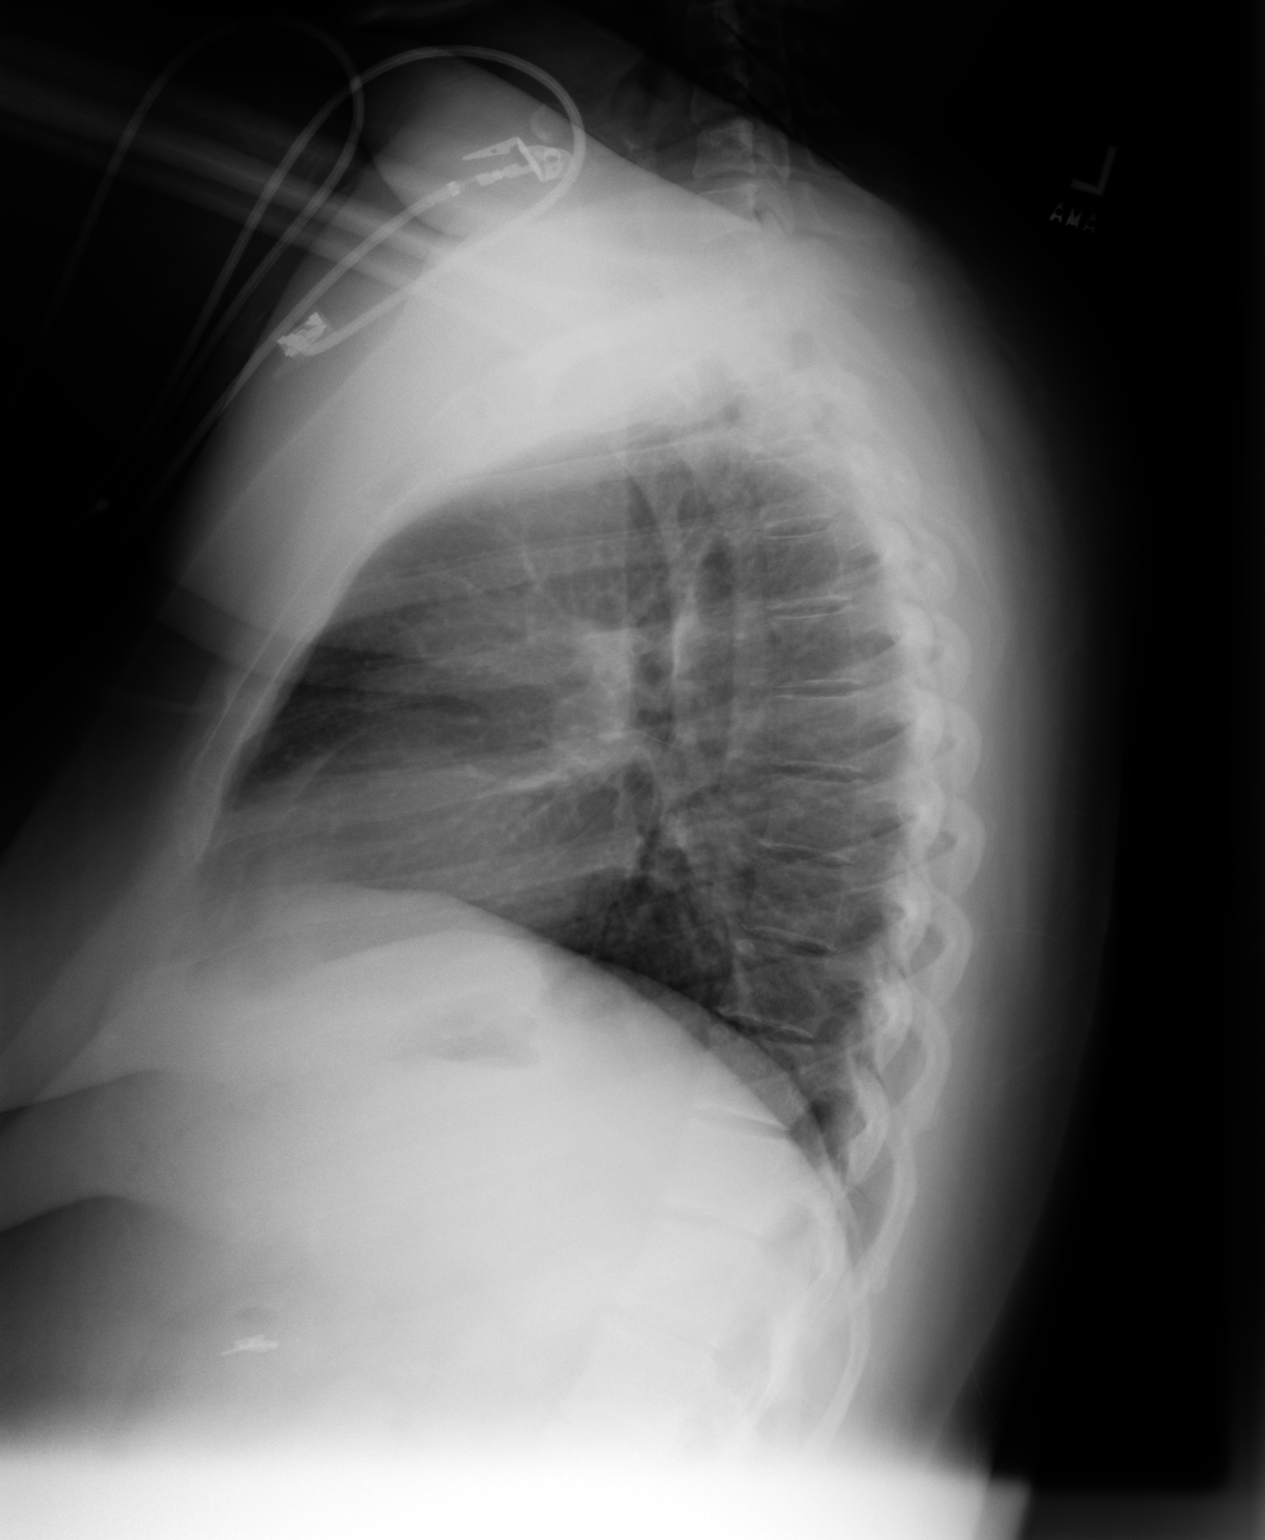

[2 of 2 positions shown; findings below may reference images not displayed]

PROCEDURE:     DXR - DXR CHEST PA (OR AP) AND LATERAL  - April 06, 2006  [DATE]

RESULT:          Comparison is made to a study 07 January, 2006.

The lungs are mildly hypoinflated.  There is no focal infiltrate.  There is
no evidence of a pleural effusion.  The heart is not enlarged.  The
pulmonary vascularity is mildly engorged.
IMPRESSION: There is pulmonary hypoinflation and some prominence of
the pulmonary vascularity.  The findings may reflect an element of low-grade
congestive heart failure.  However, a followup PA and lateral chest x-ray
with deep inspiration would be of value.  It is likely that some vascular
crowding is created secondary to the hypoinflation of the lungs.

## 2007-11-08 IMAGING — CT CT HEAD WITHOUT CONTRAST
2 series · 16 of 30 positions shown, 20 images · non-contrast
Comparison: none

REASON FOR EXAM: weakness
COMMENTS:  LMP: One week ago

[Series 2: without · axial · non-contrast · 0.40mm/px · z∈[-223,-103]mm · 13 of 30 slices shown, 17 images]
[im 3/30  brain]
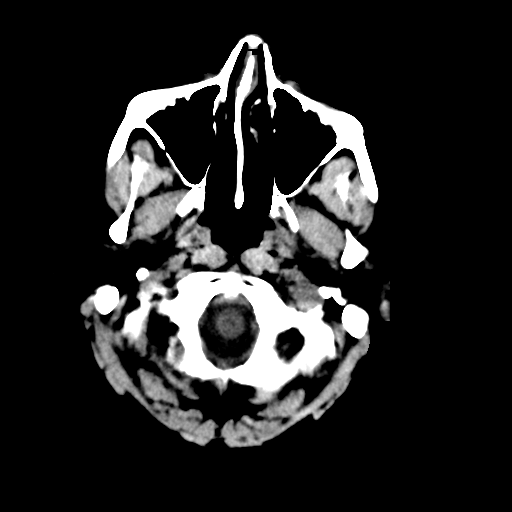
[im 3/30  bone]
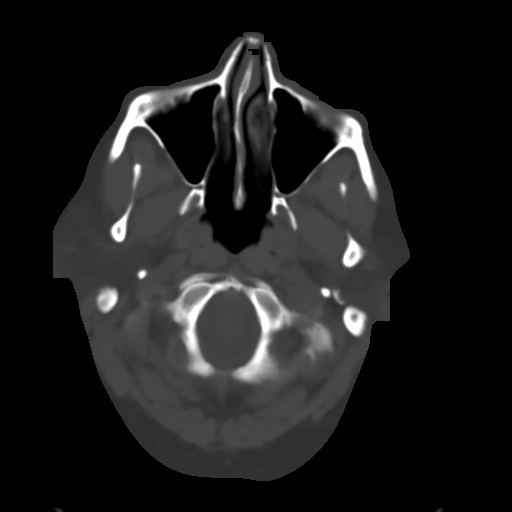
[im 5/30  brain]
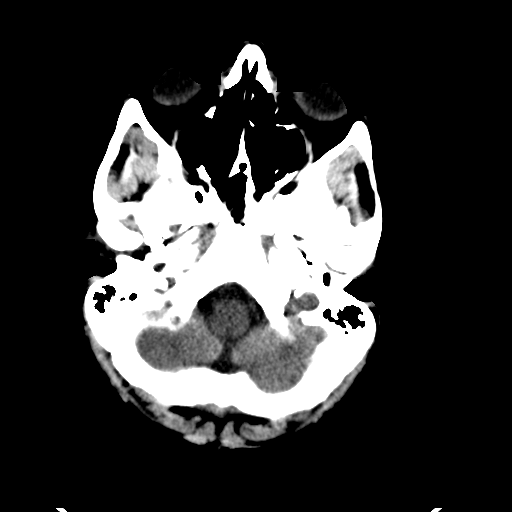
[im 7/30  brain]
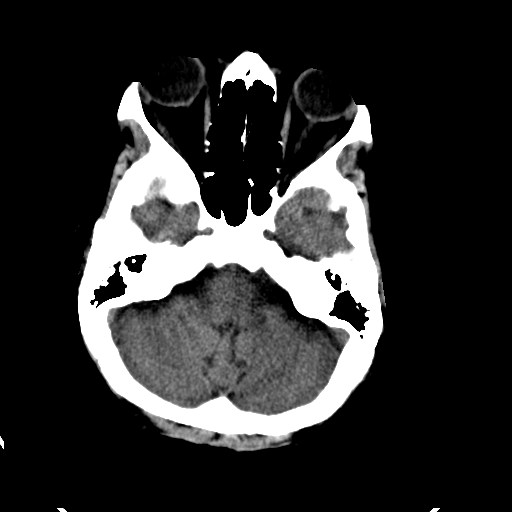
[im 9/30  brain]
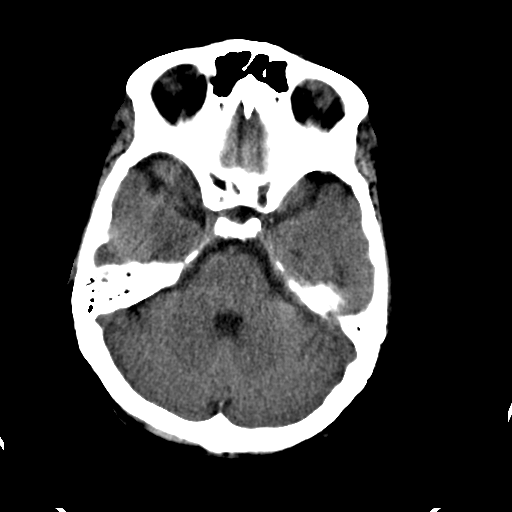
[im 11/30  brain]
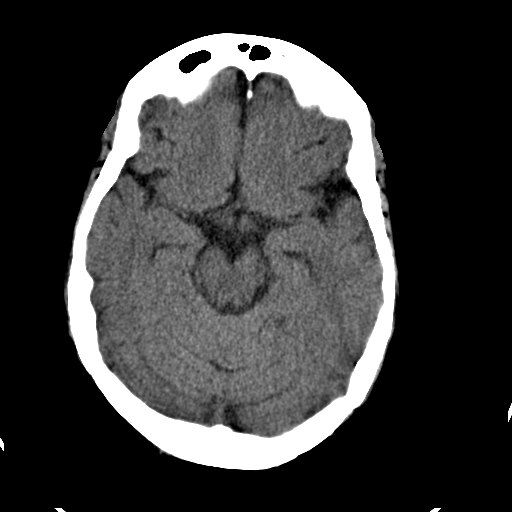
[im 11/30  bone]
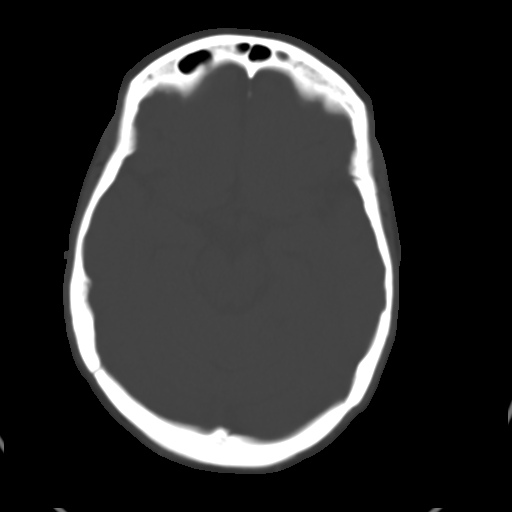
[im 13/30  brain]
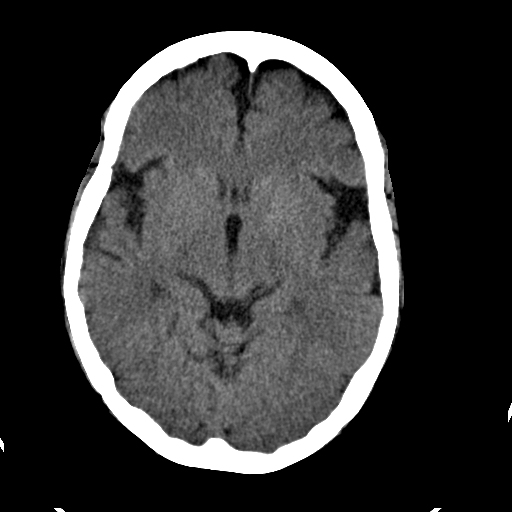
[im 15/30  brain]
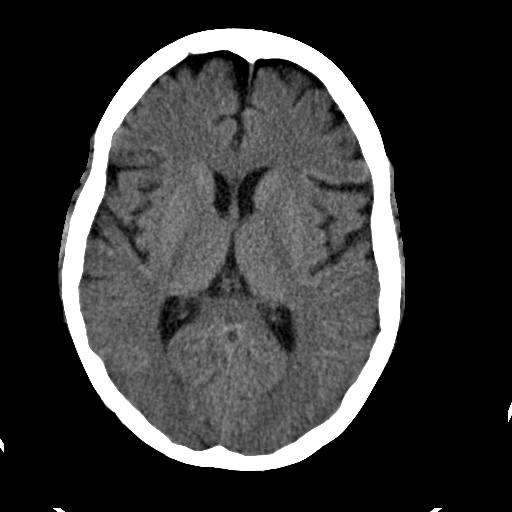
[im 17/30  brain]
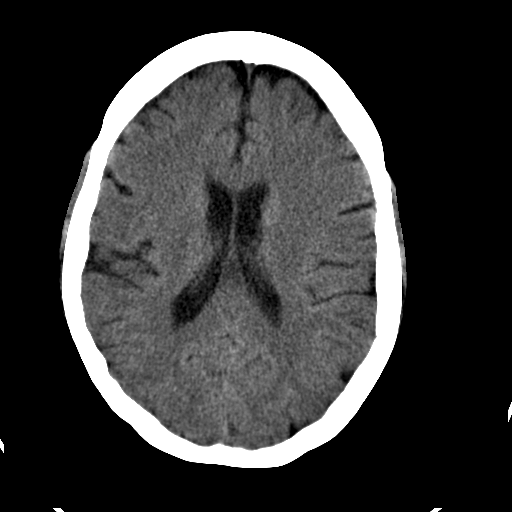
[im 19/30  brain]
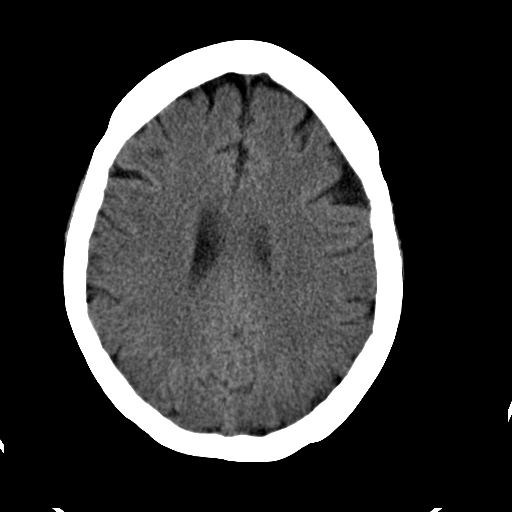
[im 19/30  bone]
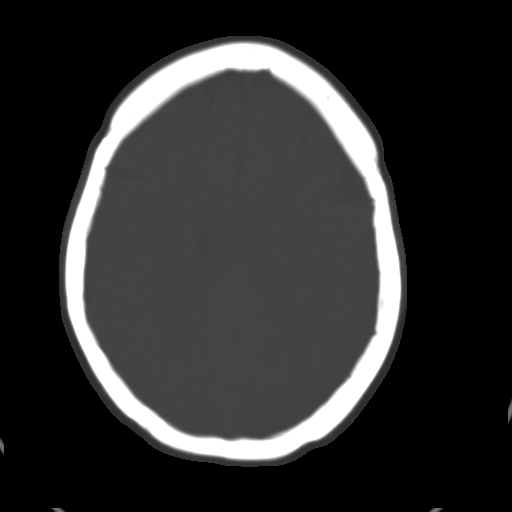
[im 21/30  brain]
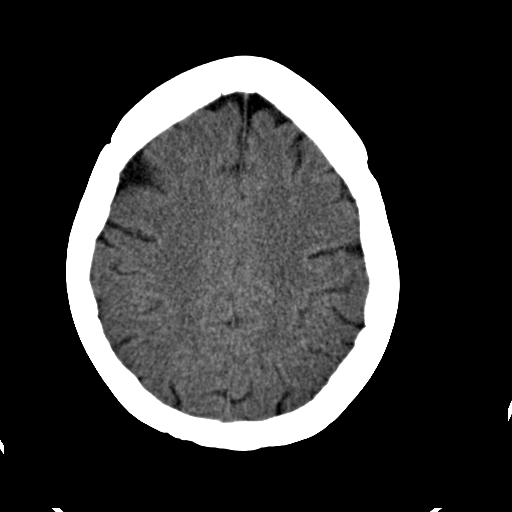
[im 23/30  brain]
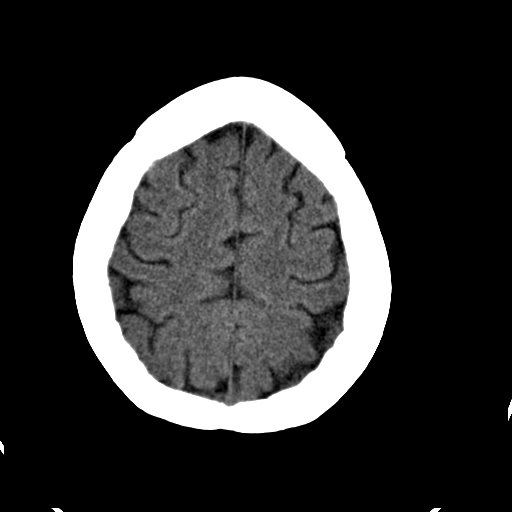
[im 25/30  brain]
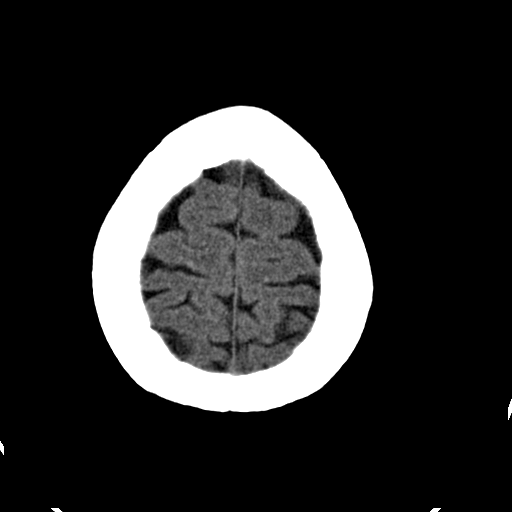
[im 27/30  brain]
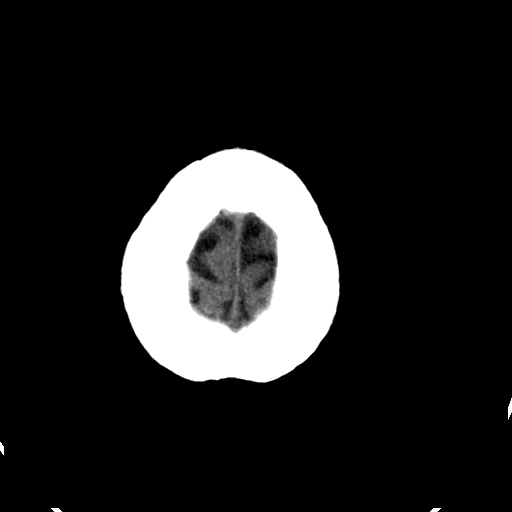
[im 27/30  bone]
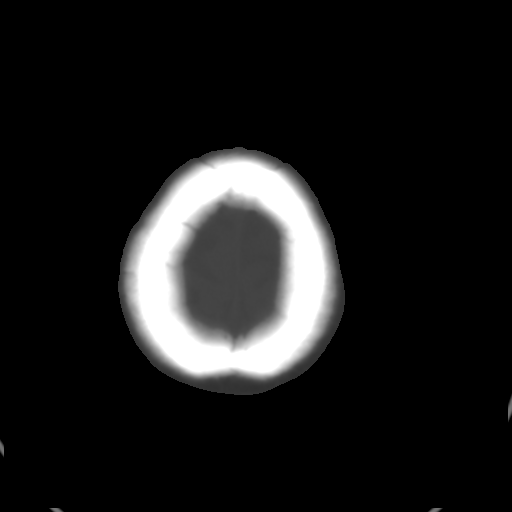

[Series 3: bone · axial · 0.40mm/px · z∈[-223,-183]mm · 3 of 30 slices shown]
[im 3/30  bone]
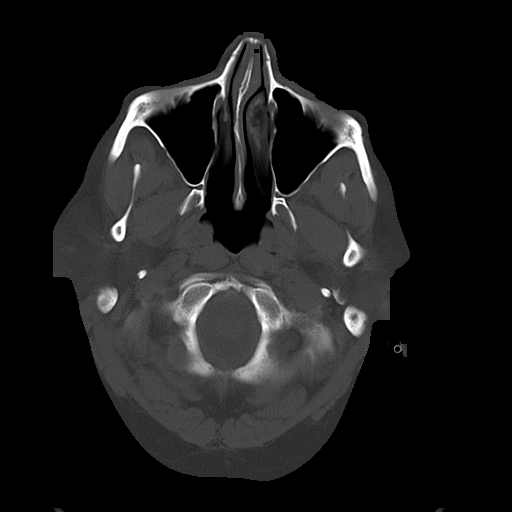
[im 7/30  bone]
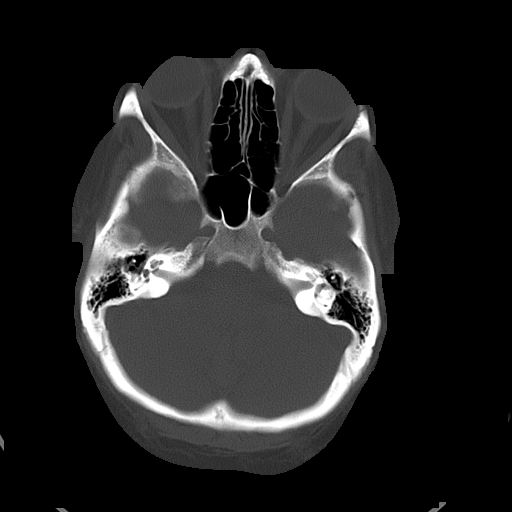
[im 11/30  bone]
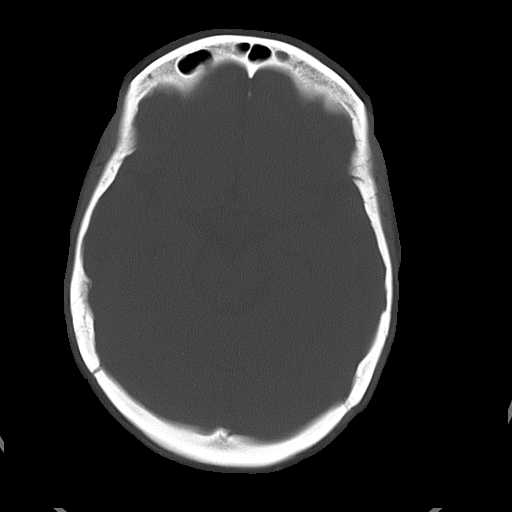

[16 of 30 positions shown; findings below may reference images not displayed]

PROCEDURE:     CT  - CT HEAD WITHOUT CONTRAST  - April 06, 2006  [DATE]

RESULT:          The patient reports intermittent slurred speech and has
fallen recently, striking the back of her head.

The ventricles are normal in size and position.  There is no intracranial
hemorrhage, mass, or mass effect.  At bone window settings, I see no
air-fluid levels in the visualized portions of the paranasal sinuses.  There
is no evidence of a skull fracture.
IMPRESSION: I see no acute intracranial abnormality.

The findings were called to the emergency room at the conclusion of the
study.

## 2007-11-10 ENCOUNTER — Emergency Department: Payer: Self-pay | Admitting: Internal Medicine

## 2007-12-12 ENCOUNTER — Inpatient Hospital Stay: Payer: Self-pay | Admitting: Internal Medicine

## 2007-12-14 ENCOUNTER — Encounter: Payer: Self-pay | Admitting: Family Medicine

## 2008-01-07 ENCOUNTER — Telehealth: Payer: Self-pay | Admitting: Family Medicine

## 2008-02-06 ENCOUNTER — Telehealth: Payer: Self-pay | Admitting: Family Medicine

## 2008-02-13 ENCOUNTER — Emergency Department: Payer: Self-pay | Admitting: Emergency Medicine

## 2008-03-26 ENCOUNTER — Emergency Department: Payer: Self-pay | Admitting: Internal Medicine

## 2008-03-30 IMAGING — CR DG CHEST 2V
1 series · 2 of 2 positions shown · non-contrast
Comparison: none

REASON FOR EXAM: Cough, back pain
COMMENTS:  LMP: > one month ago

[Series 1: view not recorded · 0.17mm/px · 2 of 2 slices shown]
[im 1/2]
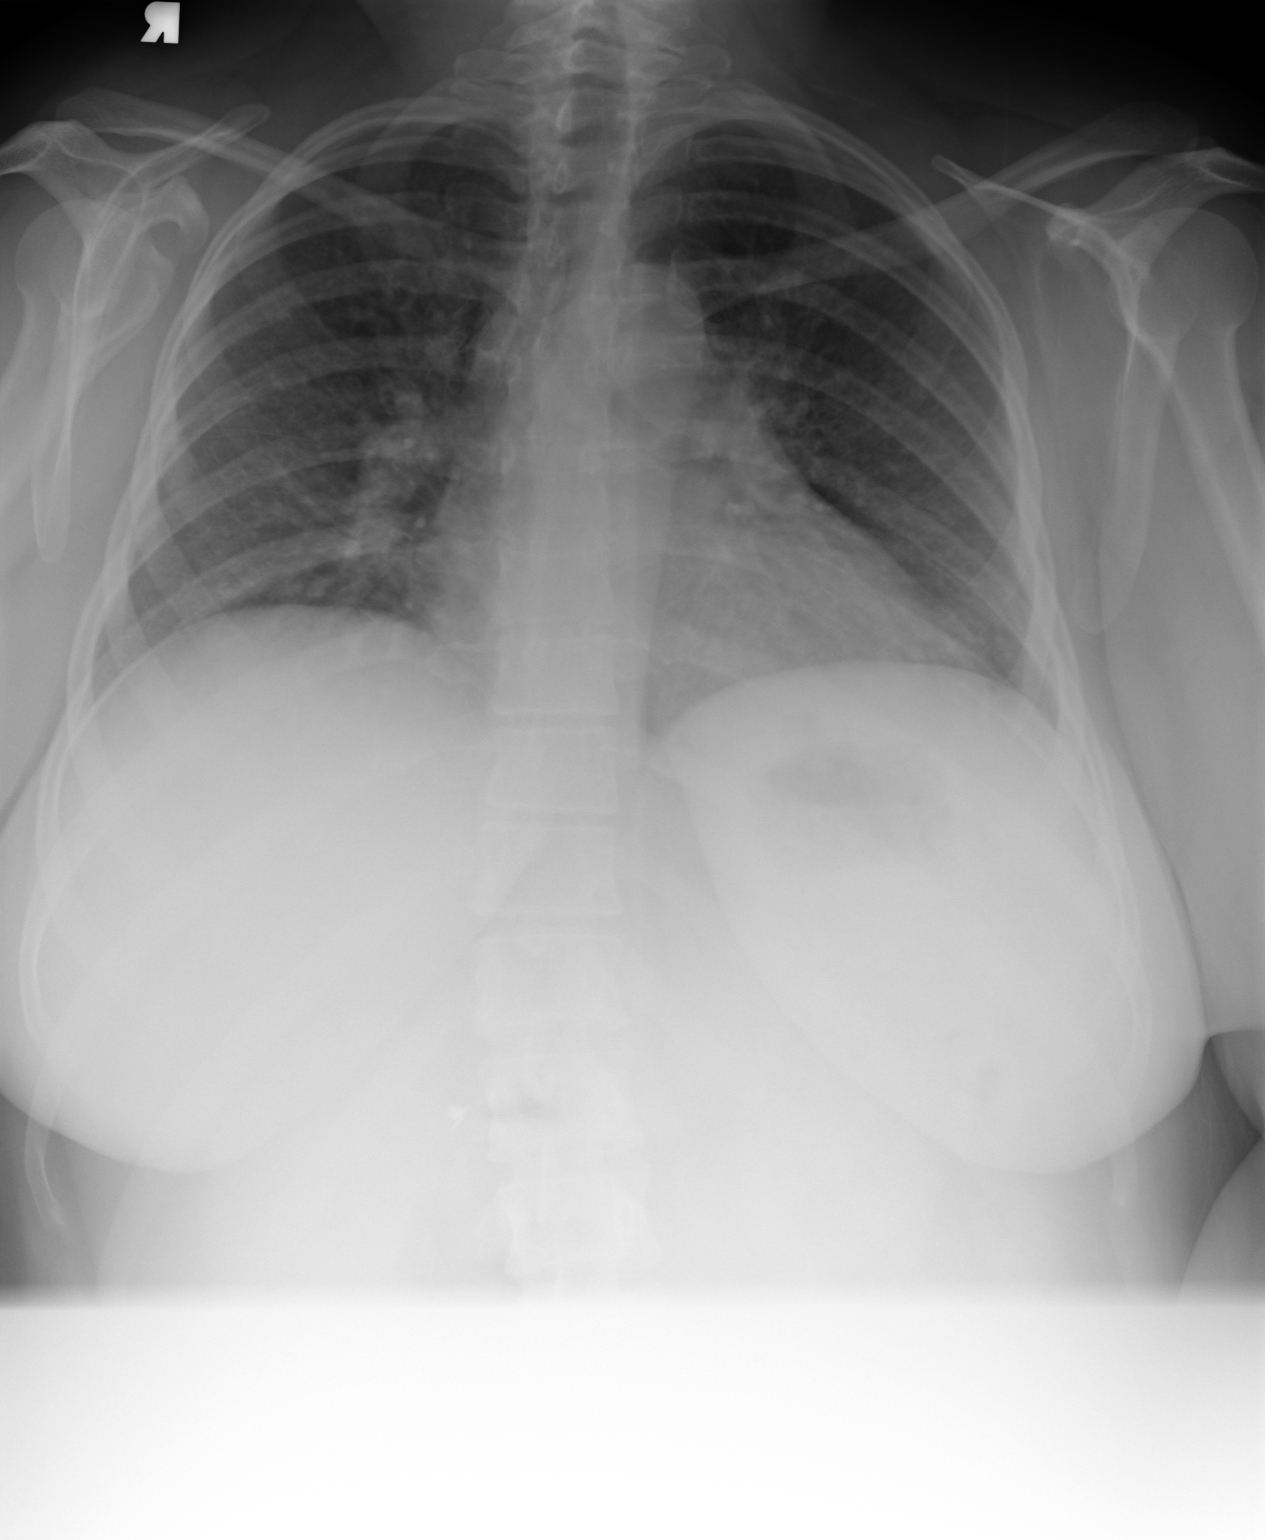
[im 2/2]
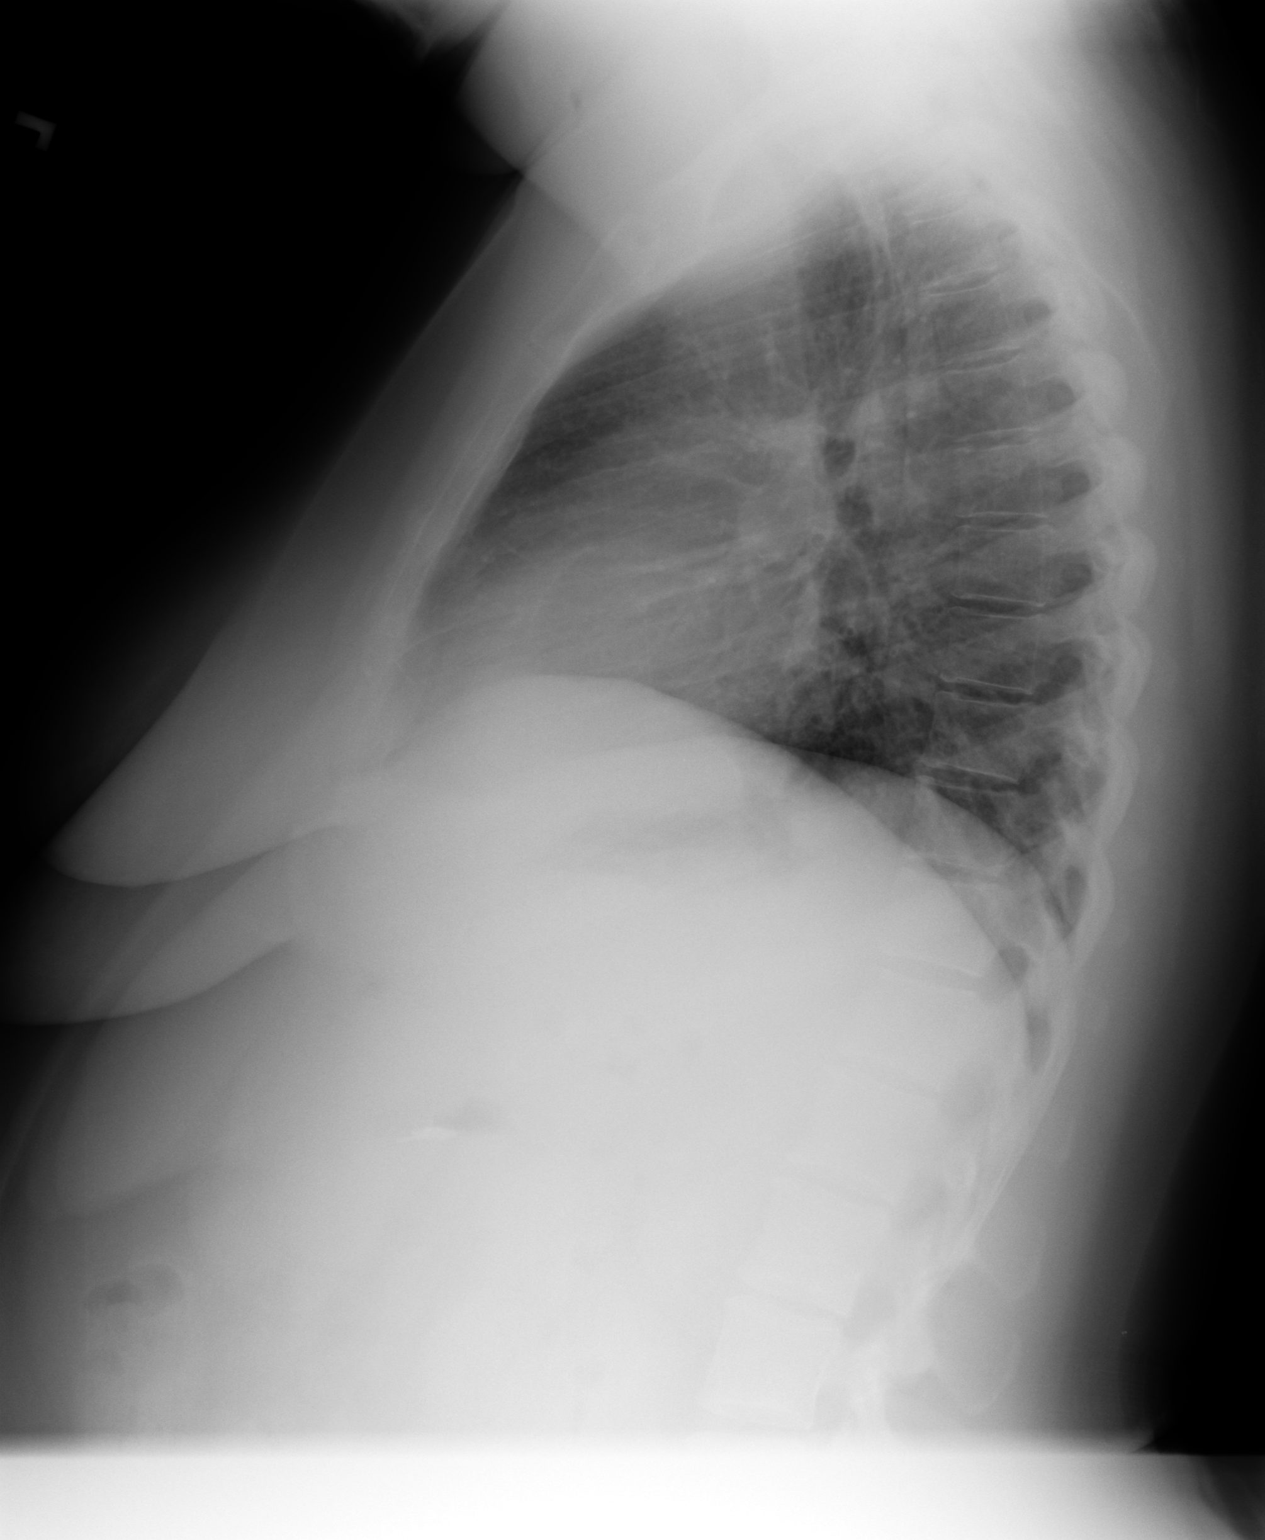

[2 of 2 positions shown; findings below may reference images not displayed]

PROCEDURE:     DXR - DXR CHEST PA (OR AP) AND LATERAL  - August 27, 2006  [DATE]

RESULT:     The lungs are mildly hypoinflated. There is no focal infiltrate
or evidence of a pleural effusion. I see no significant atelectasis. The
perihilar lung markings are increased. The cardiac silhouette is at the
upper limits of normal for size though this is accentuated by the
hypoinflation. No acute bony abnormality is seen.
IMPRESSION: Very mildly increased perihilar lung markings are noted
which may reflect minimal subsegmental atelectasis or peribronchial cuffing.
I do not see evidence of CHF nor of pneumonia.

## 2008-04-03 IMAGING — CR DG FOOT COMPLETE 3+V*L*
1 series · 3 of 3 positions shown · non-contrast
Comparison: none

REASON FOR EXAM: Fall/injury    ENT
COMMENTS:

PROCEDURE:     DXR - DXR FOOT LT COMP W/OBLIQUES  - August 31, 2006 [DATE]
RESULT:       No acute bony or joint abnormality is identified.  There is no
evidence of fracture or dislocation.

[Series 1: view not recorded · 0.17mm/px · 3 of 3 slices shown]
[im 1/3]
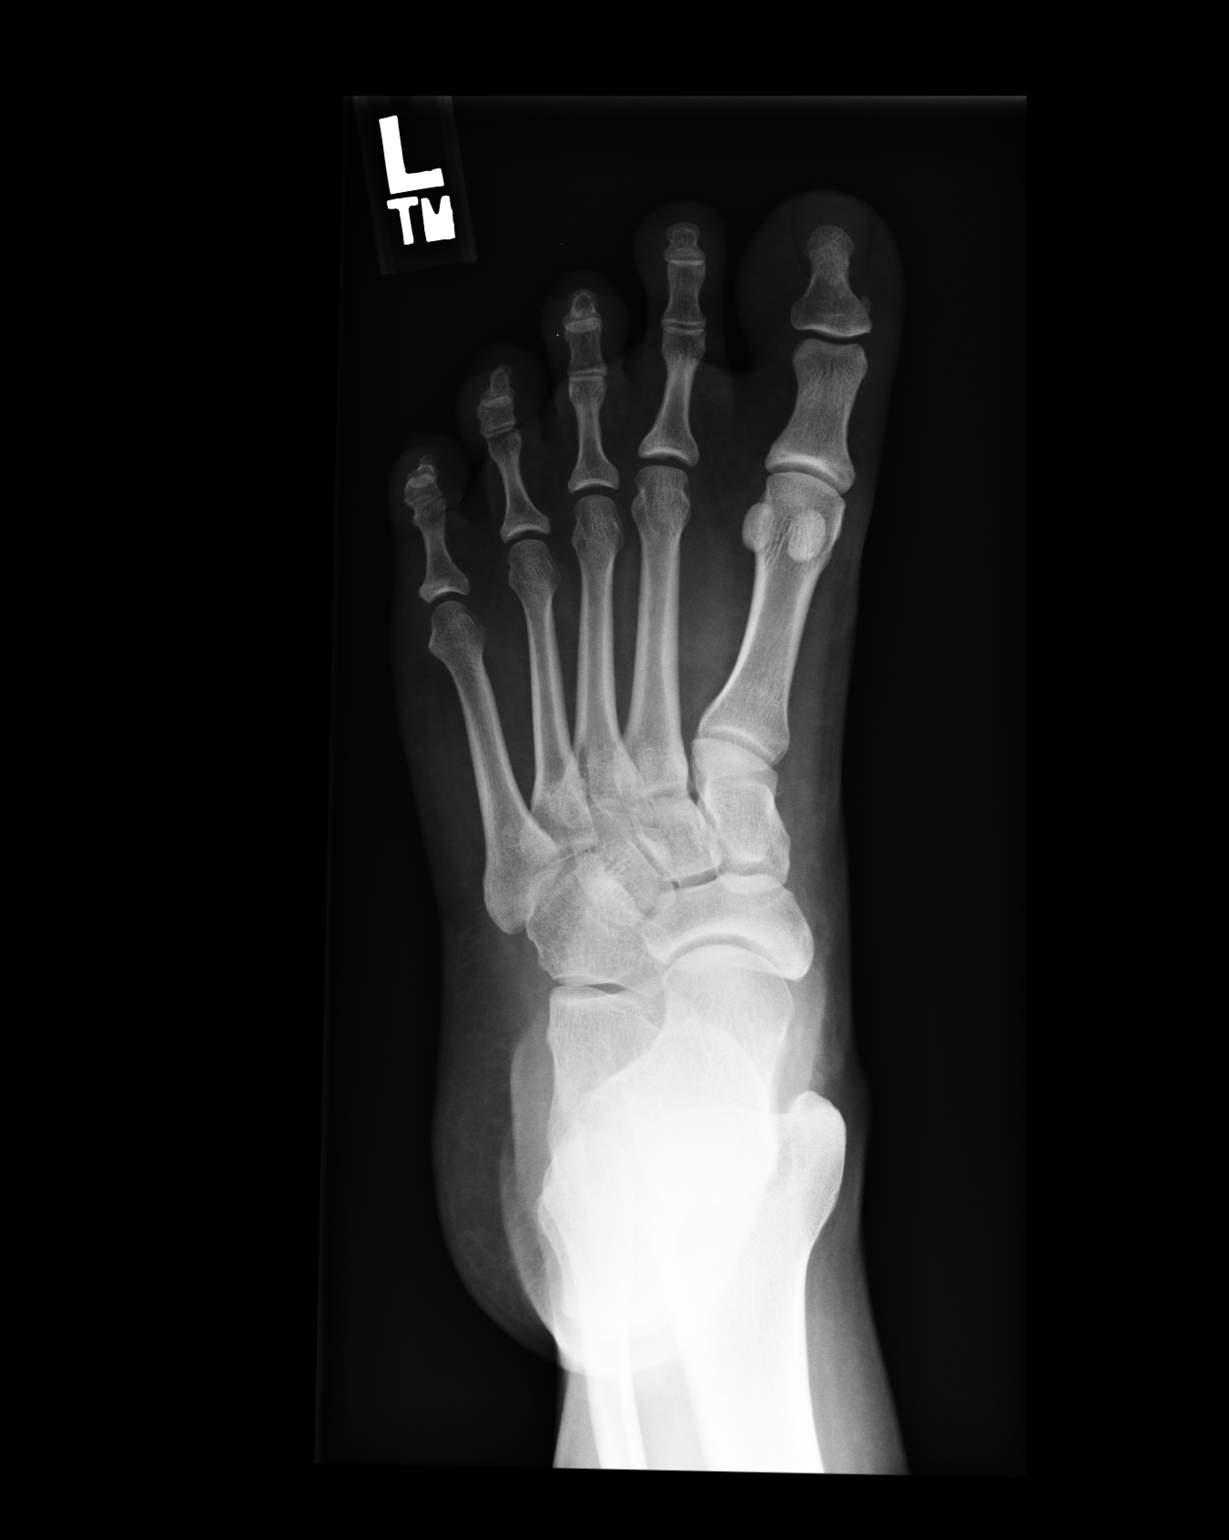
[im 2/3]
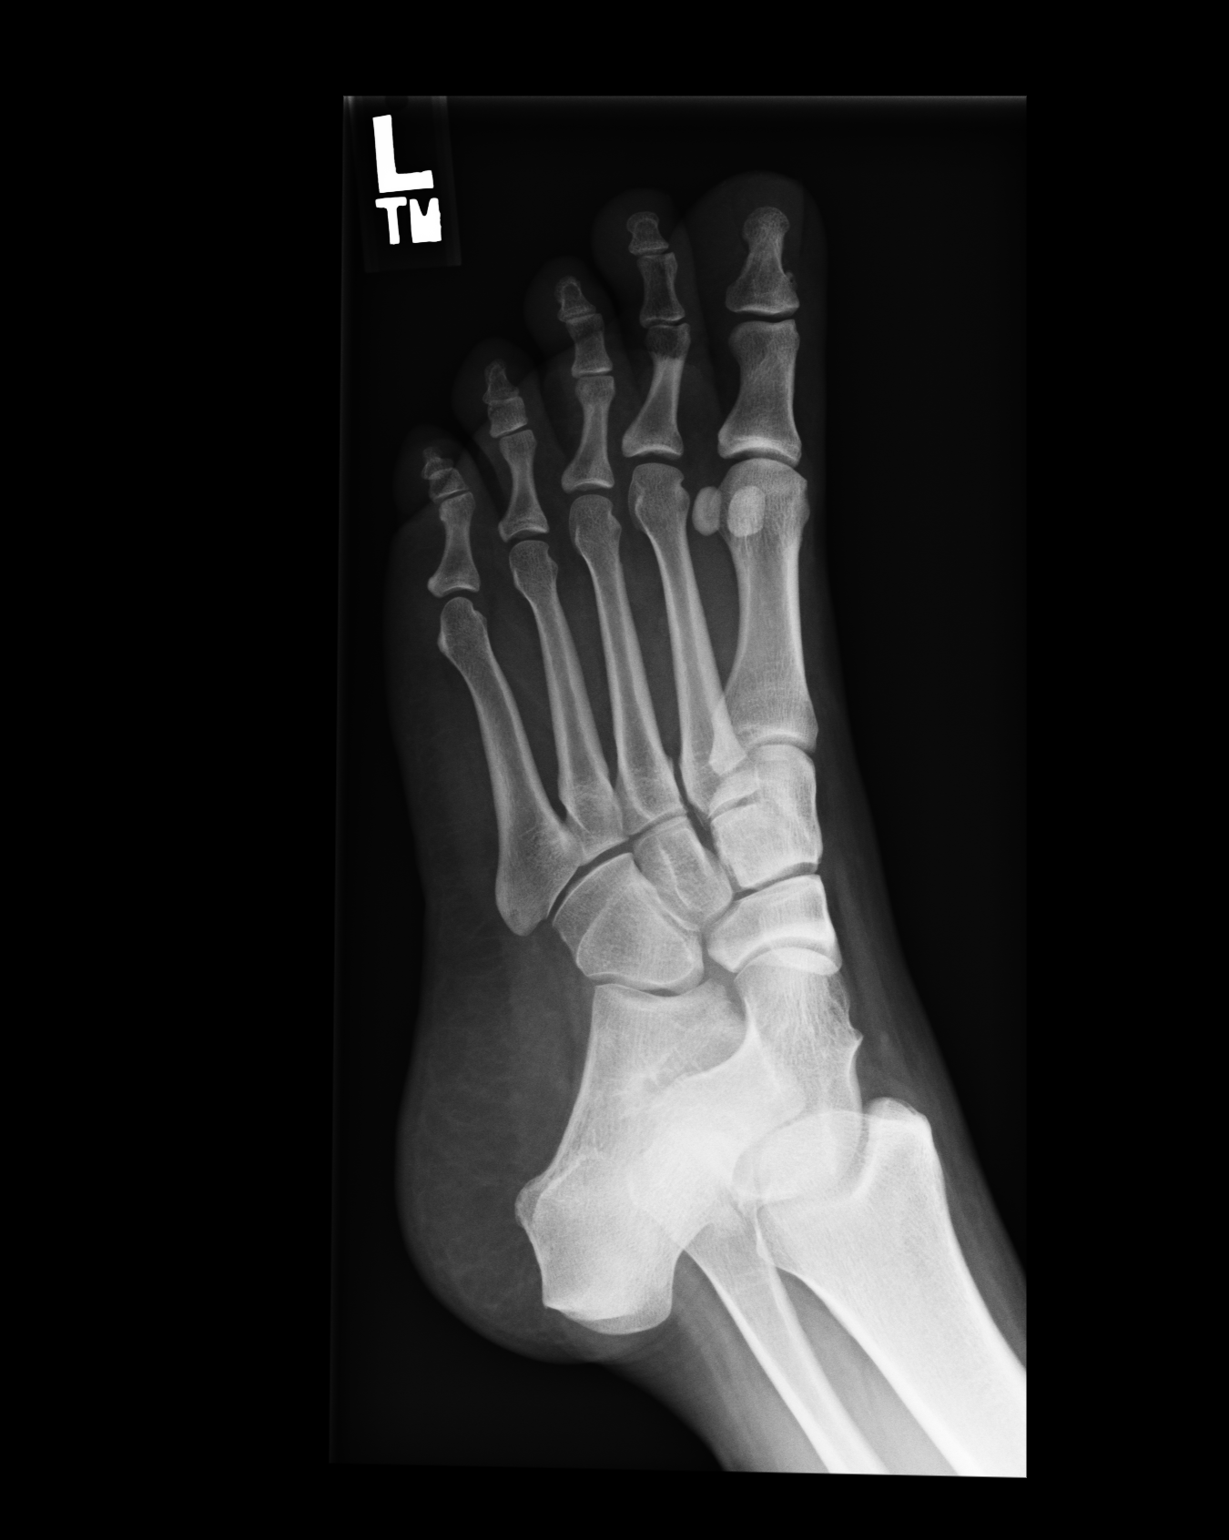
[im 3/3]
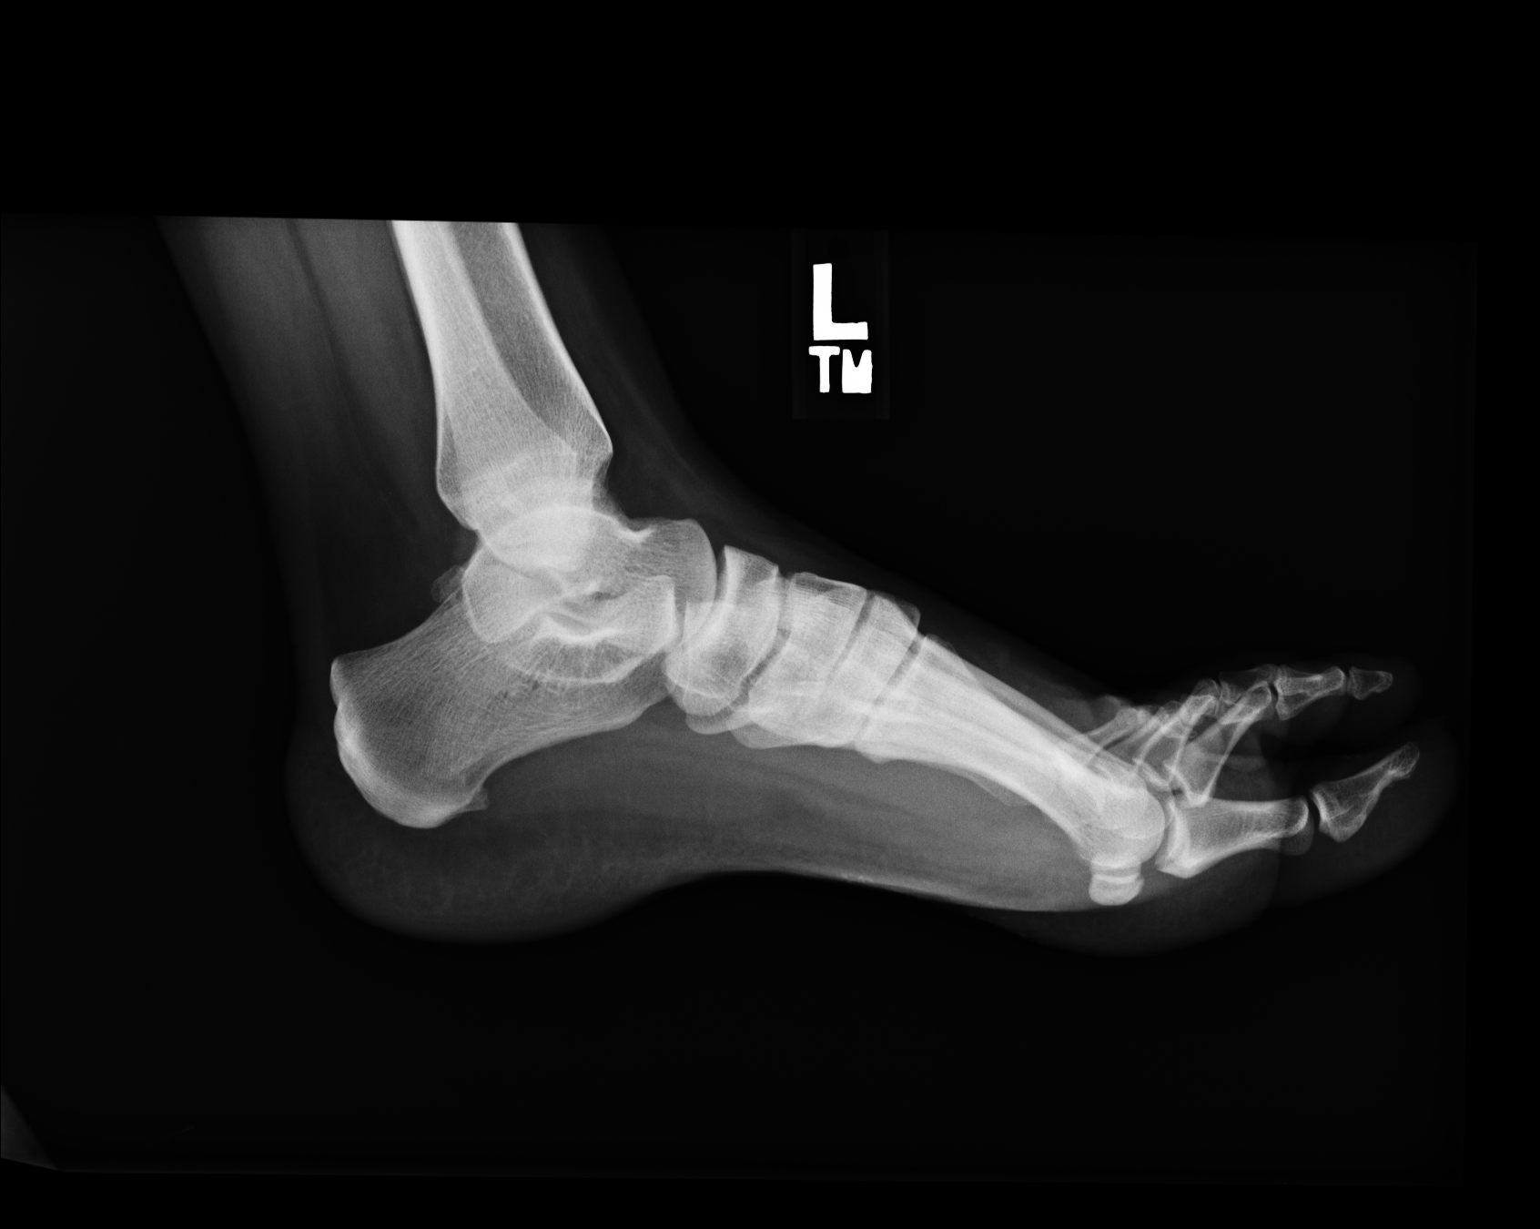

[3 of 3 positions shown; findings below may reference images not displayed]

IMPRESSION: No acute abnormality is identified.

## 2008-04-06 IMAGING — CR DG CHEST 2V
1 series · 2 of 2 positions shown · non-contrast
Comparison: none

REASON FOR EXAM: Cough, recent pneumonia
COMMENTS:

[Series 1: view not recorded · 0.17mm/px · 2 of 2 slices shown]
[im 1/2]
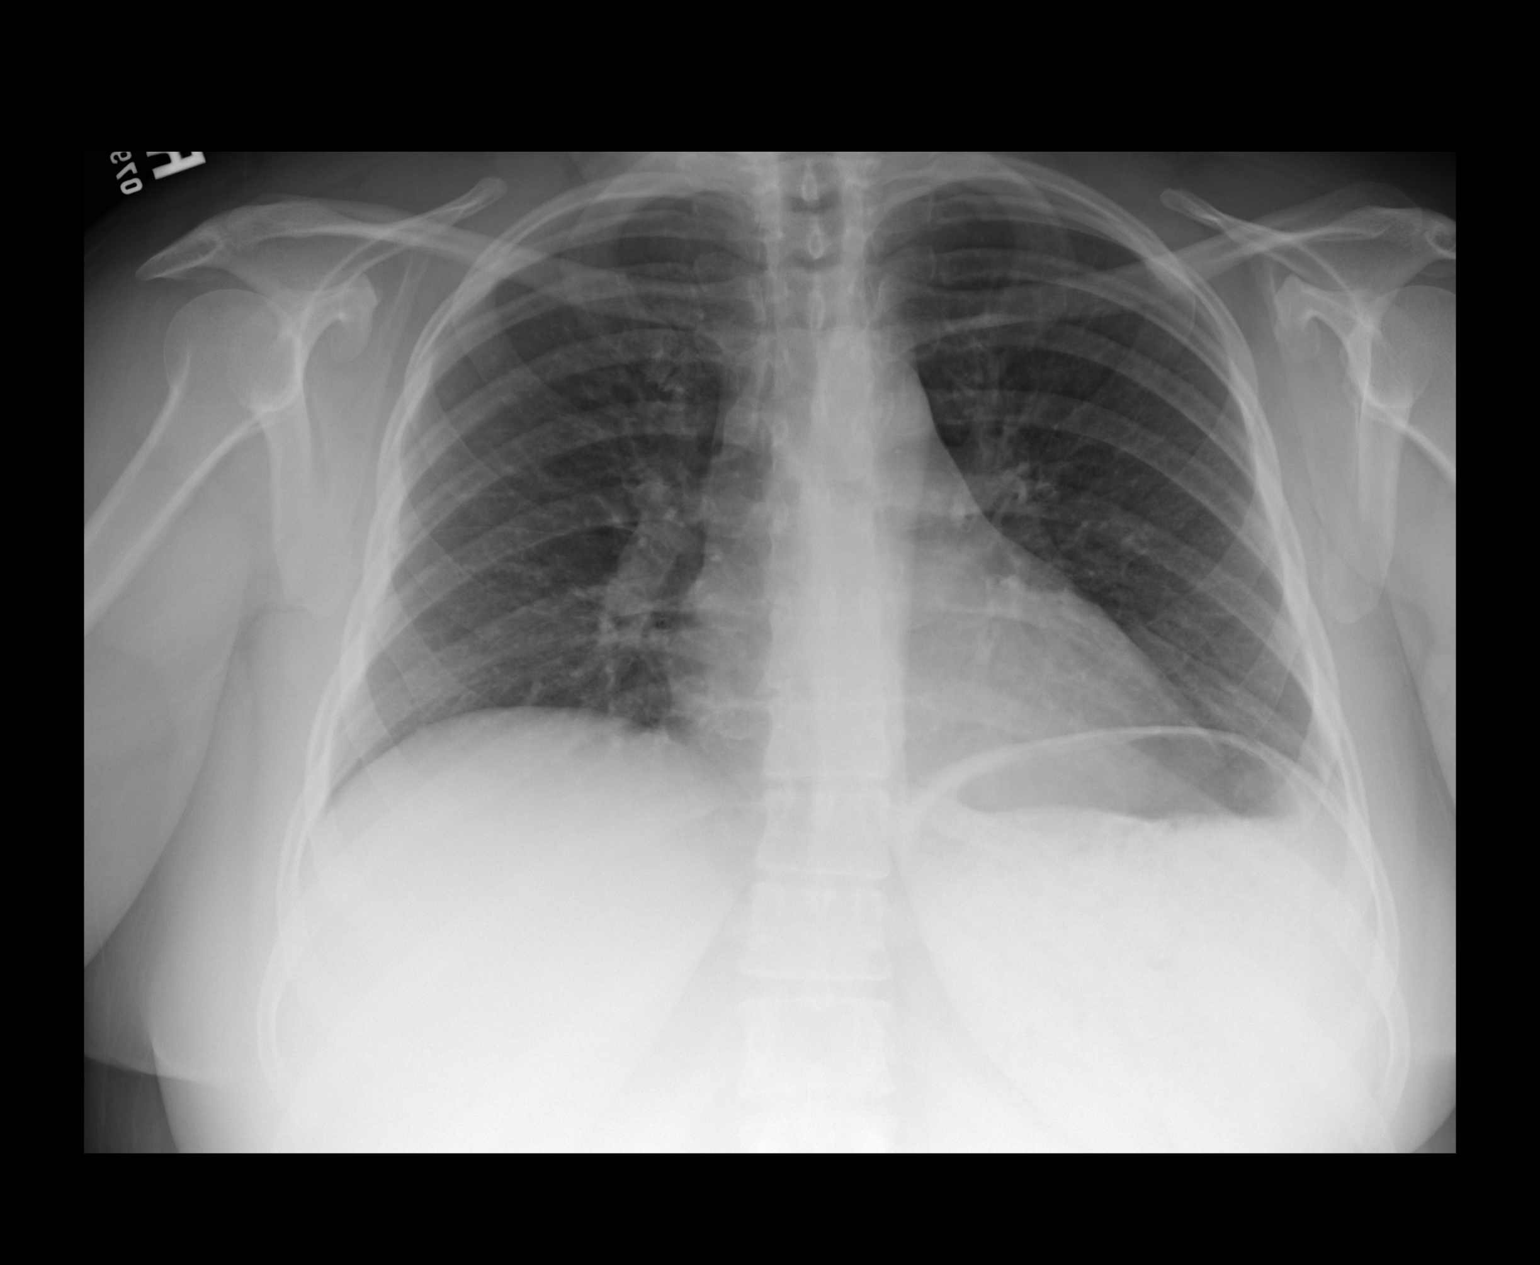
[im 2/2]
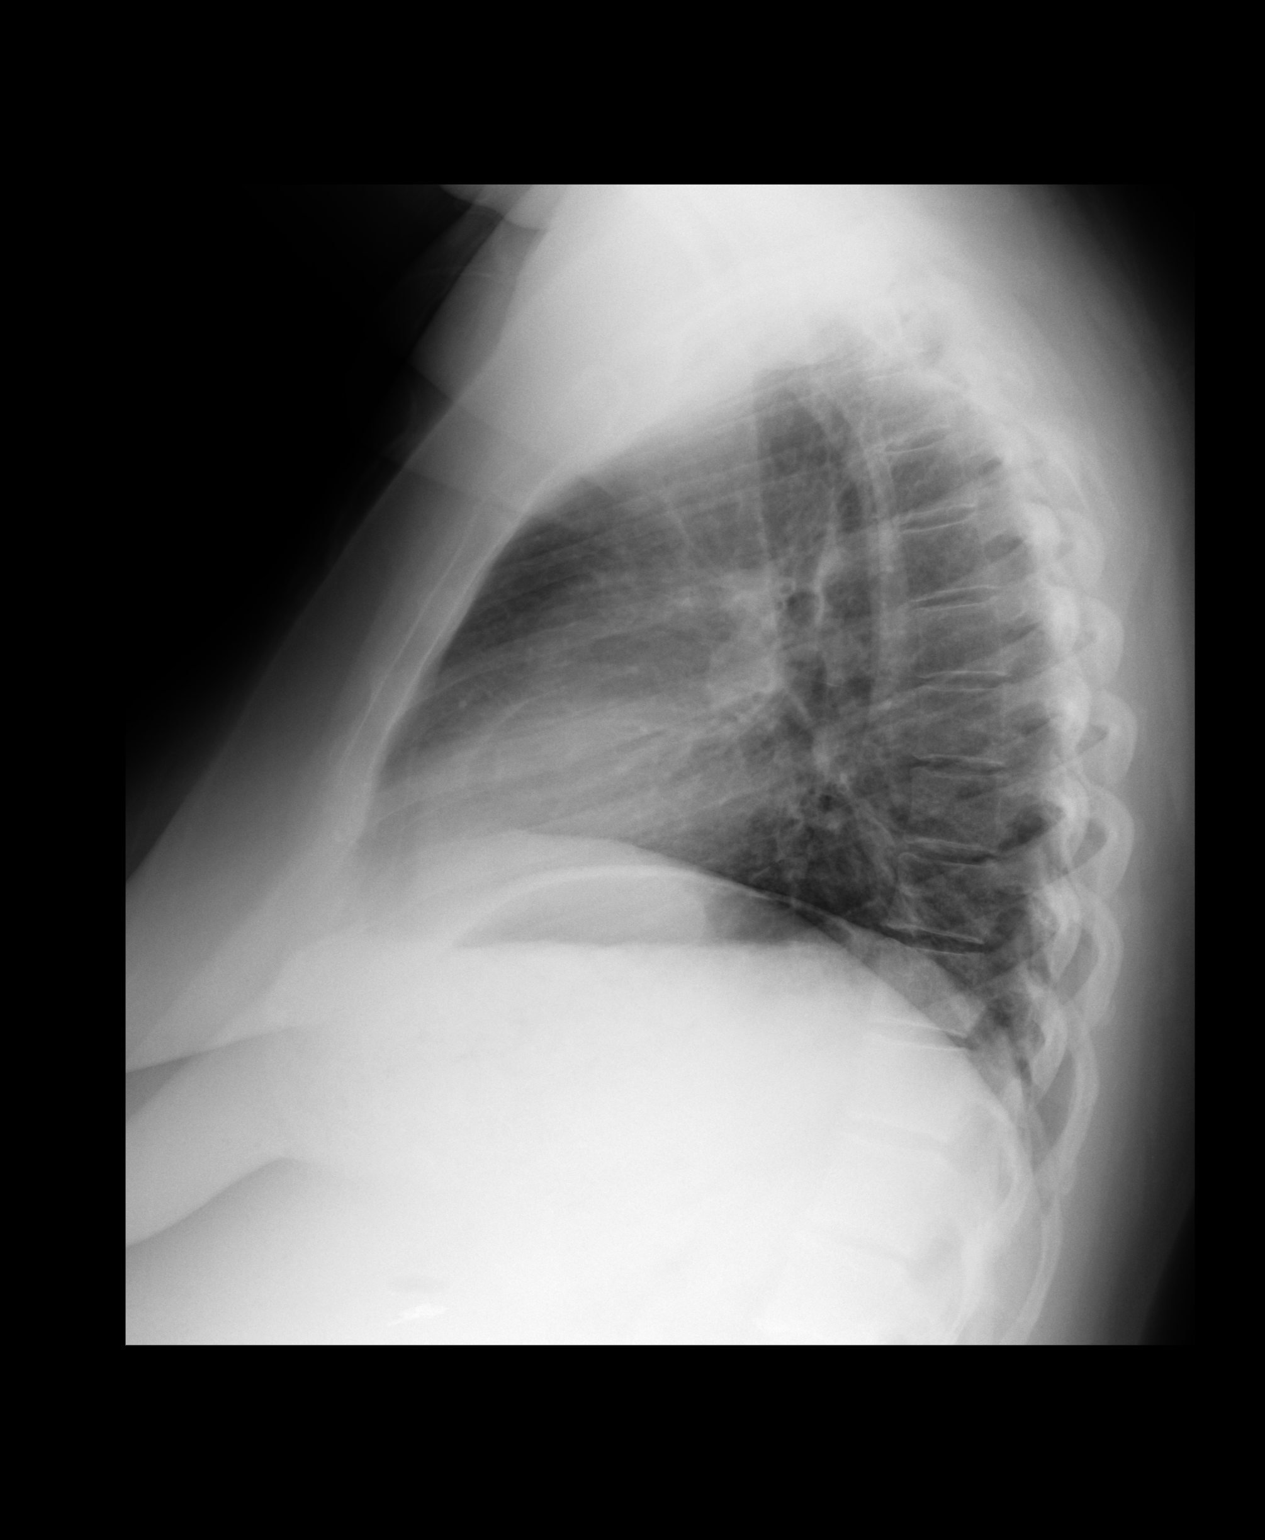

[2 of 2 positions shown; findings below may reference images not displayed]

PROCEDURE:     DXR - DXR CHEST PA (OR AP) AND LATERAL  - September 03, 2006  [DATE]

RESULT:     Comparison is made to a prior study dated 08/27/2006.

The patient has taken a shallow inspiration. With technique taken into
consideration, there is no evidence of focal regions of consolidation or
focal infiltrates. There is once again mild prominence of the interstitial
markings. The cardiac silhouette is within normal limits. The visualized
bony skeleton is unremarkable.
IMPRESSION: Mild interstitial prominence without focal regions of
consolidation. This may represent an element of mild interstitial
pneumonitis or may simply be secondary to the shallow inspiration. This
finding has improved when compared to the previous study. No new focal
regions of consolidation are demonstrated.

## 2008-04-12 IMAGING — CR DG CHEST 2V
1 series · 2 of 2 positions shown · non-contrast
Comparison: none

REASON FOR EXAM: Shortness of breath
COMMENTS:  LMP: Post-Menopausal

[Series 1: view not recorded · 0.17mm/px · 2 of 2 slices shown]
[im 1/2]
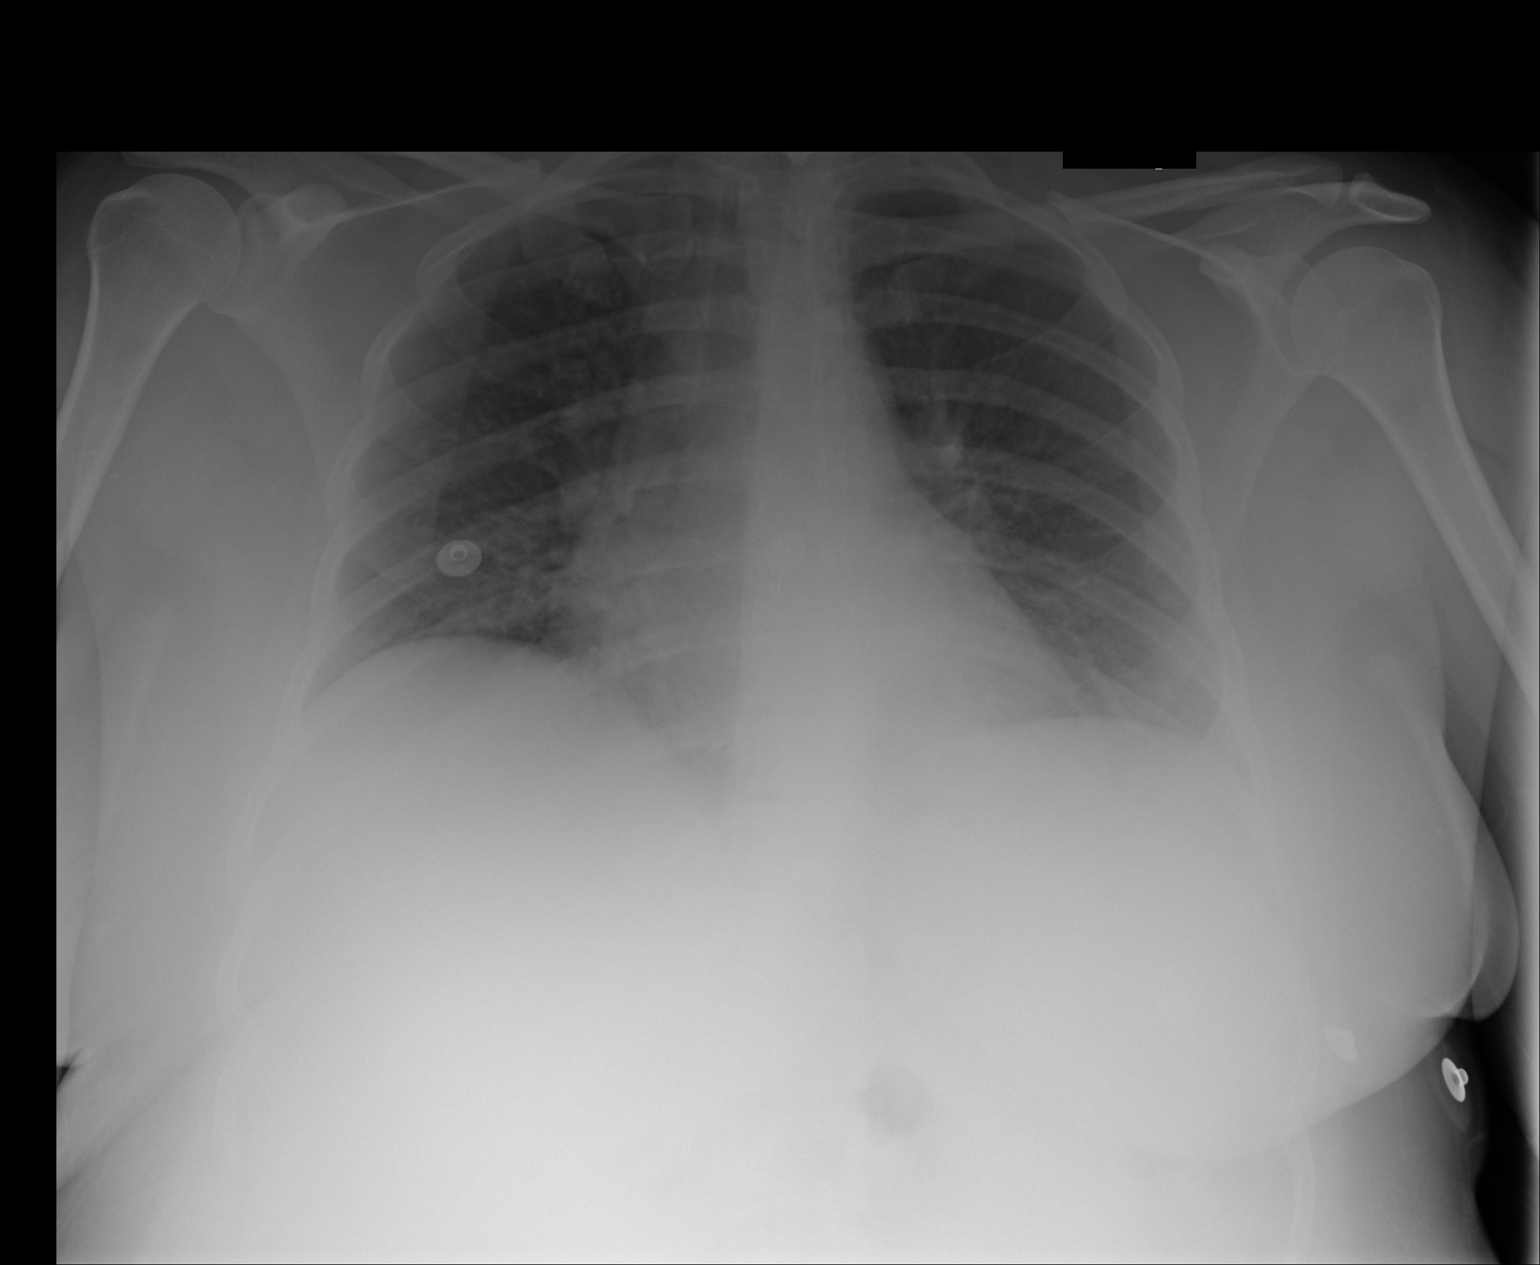
[im 2/2]
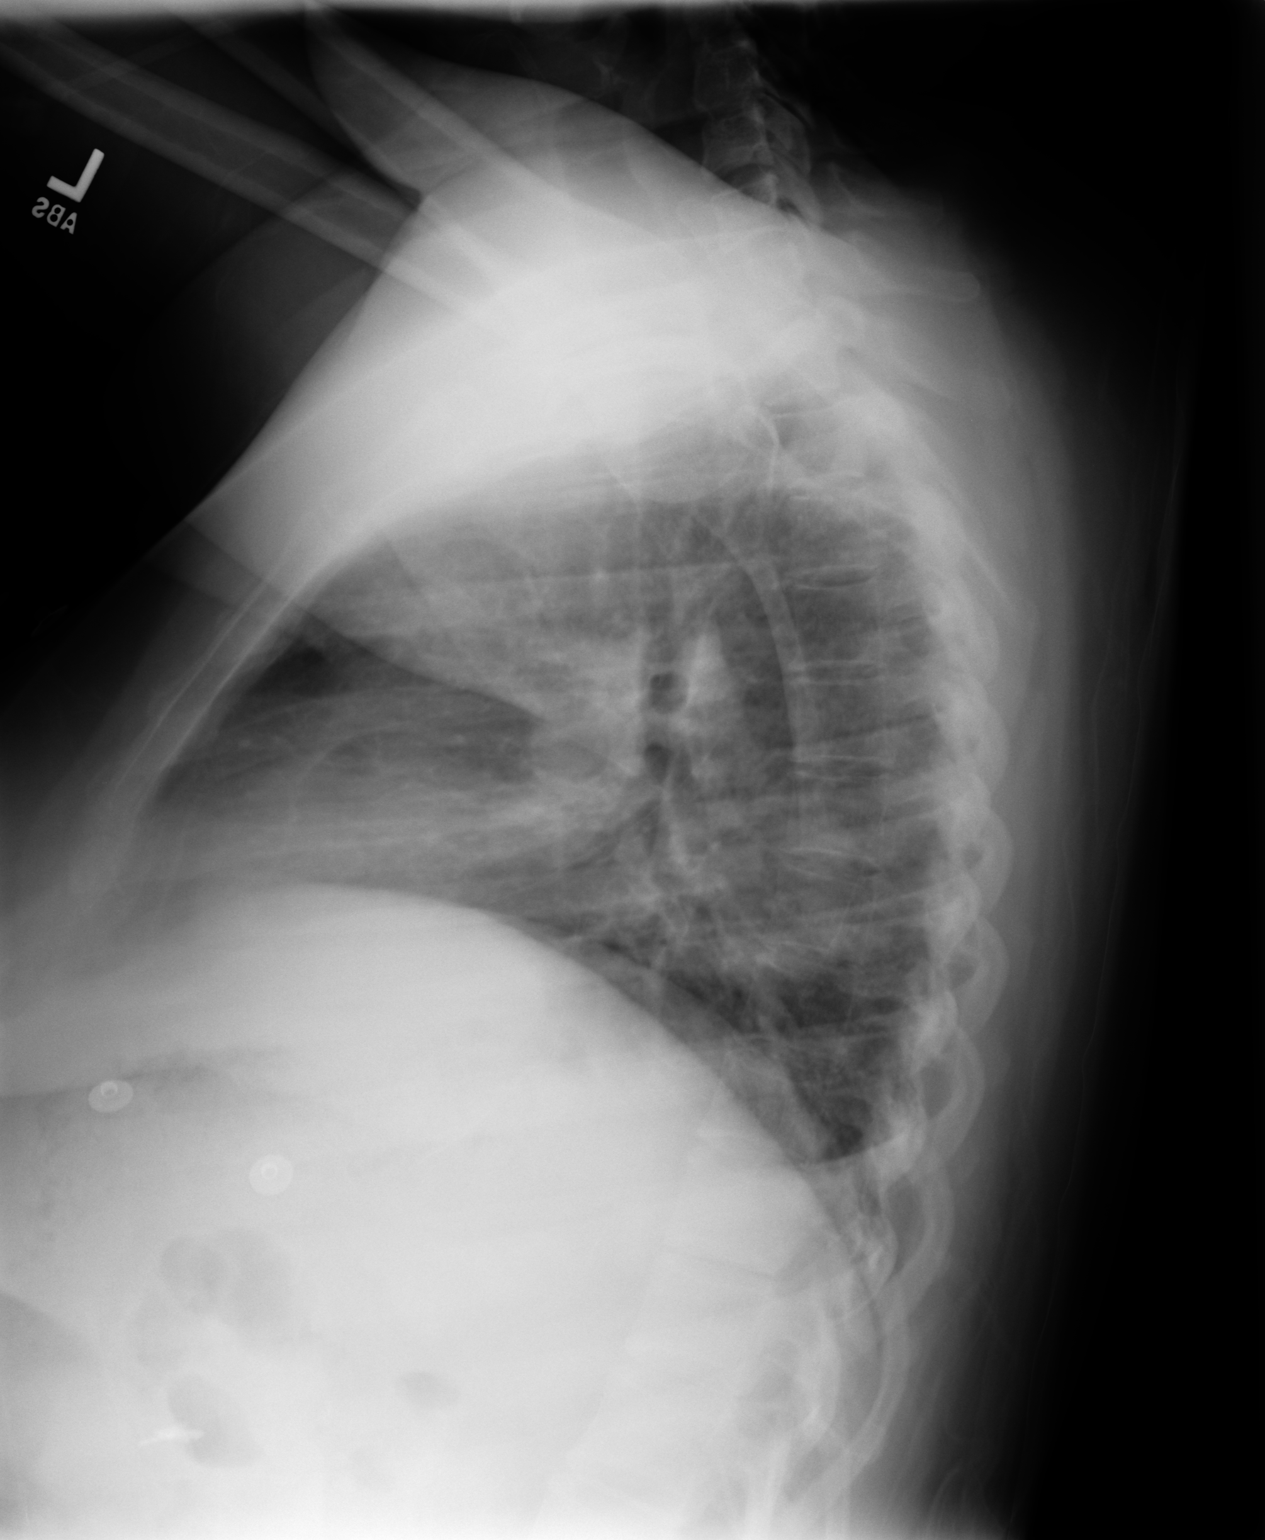

[2 of 2 positions shown; findings below may reference images not displayed]

PROCEDURE:     DXR - DXR CHEST PA (OR AP) AND LATERAL  - September 09, 2006  [DATE]

RESULT:     There is mild thickening of the interstitial markings as well as
an element of mild peribronchial cuffing. These findings may be secondary to
a shallow inspiration. No focal regions of consolidation are demonstrated.
The cardiac silhouette is moderately enlarged. The visualized bony skeleton
is unremarkable.
IMPRESSION: Findings possibly representing an element of pulmonary
vascular congestion without focal regions of consolidation.

## 2008-05-07 ENCOUNTER — Ambulatory Visit: Payer: Self-pay | Admitting: Family Medicine

## 2008-05-08 ENCOUNTER — Encounter (INDEPENDENT_AMBULATORY_CARE_PROVIDER_SITE_OTHER): Payer: Self-pay | Admitting: *Deleted

## 2008-05-20 ENCOUNTER — Telehealth: Payer: Self-pay | Admitting: Family Medicine

## 2008-05-22 ENCOUNTER — Emergency Department: Payer: Self-pay | Admitting: Emergency Medicine

## 2008-06-08 ENCOUNTER — Encounter: Payer: Self-pay | Admitting: Family Medicine

## 2008-06-08 ENCOUNTER — Ambulatory Visit: Payer: Self-pay | Admitting: Family Medicine

## 2008-06-09 ENCOUNTER — Telehealth: Payer: Self-pay | Admitting: Family Medicine

## 2008-06-11 LAB — CONVERTED CEMR LAB
HDL: 30.7 mg/dL — ABNORMAL LOW (ref >39.0)
Microalb, Ur: 1.7 mg/dL (ref 0.0–1.9)
Total CHOL/HDL Ratio: 4.8
VLDL: 76 mg/dL — ABNORMAL HIGH (ref 0–40)

## 2008-08-17 ENCOUNTER — Encounter: Payer: Self-pay | Admitting: Family Medicine

## 2008-08-17 ENCOUNTER — Emergency Department: Payer: Self-pay | Admitting: Emergency Medicine

## 2008-08-19 IMAGING — CR DG CHEST 1V PORT
1 series · 1 of 1 positions shown · non-contrast
Comparison: none

REASON FOR EXAM: Chest Pain
COMMENTS:

PROCEDURE:     DXR - DXR PORTABLE CHEST SINGLE VIEW  - January 16, 2007  [DATE]
RESULT:     Comparison is made to the prior exam of 09-19-2006. The lung
fields are clear. The heart, mediastinal and osseous structures show no
significant abnormalities.

[view not recorded]
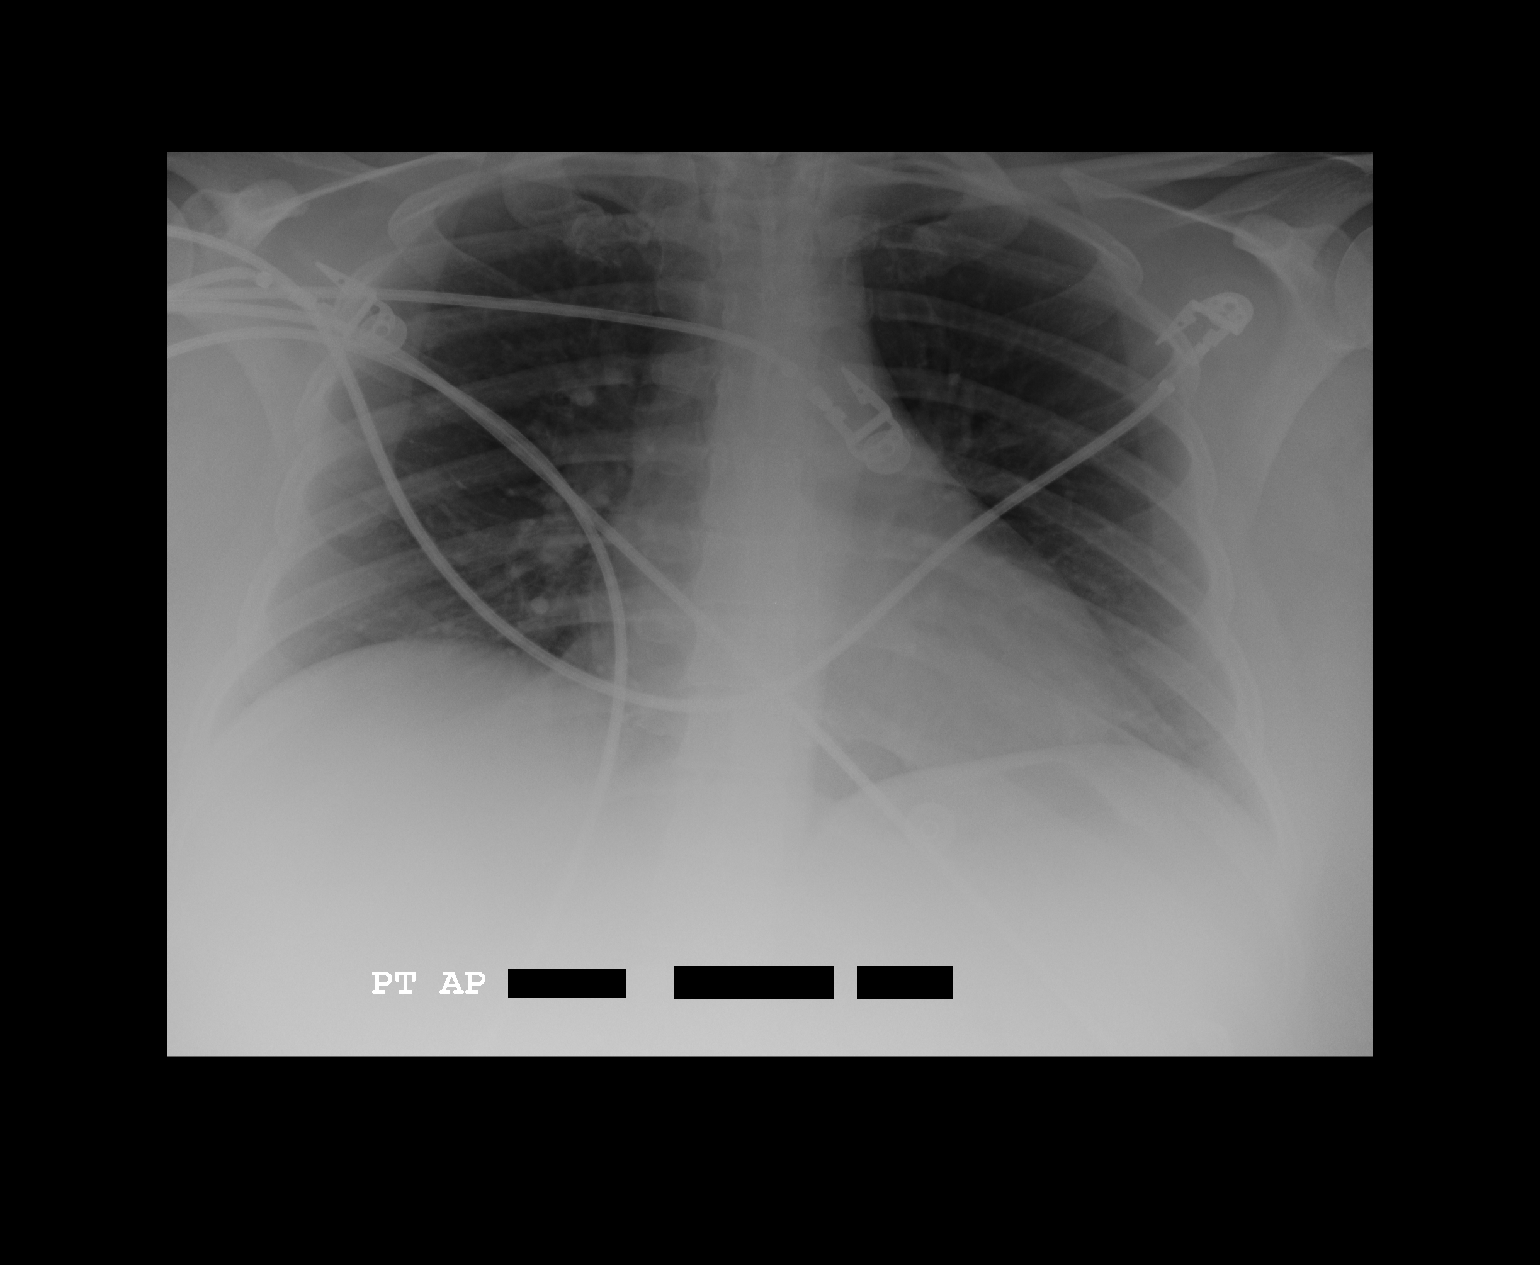

[1 of 1 positions shown; findings below may reference images not displayed]

IMPRESSION: 1.     No significant abnormalities are noted.
2.     Monitoring electrodes are present.

## 2008-08-25 ENCOUNTER — Ambulatory Visit: Payer: Self-pay | Admitting: Family Medicine

## 2008-08-26 ENCOUNTER — Encounter: Payer: Self-pay | Admitting: Family Medicine

## 2008-09-03 ENCOUNTER — Ambulatory Visit: Payer: Self-pay | Admitting: Family Medicine

## 2008-09-03 DIAGNOSIS — E876 Hypokalemia: Secondary | ICD-10-CM | POA: Insufficient documentation

## 2008-09-03 DIAGNOSIS — K644 Residual hemorrhoidal skin tags: Secondary | ICD-10-CM | POA: Insufficient documentation

## 2008-09-04 ENCOUNTER — Ambulatory Visit: Payer: Self-pay | Admitting: Family Medicine

## 2008-09-04 ENCOUNTER — Telehealth: Payer: Self-pay | Admitting: Internal Medicine

## 2008-09-07 LAB — CONVERTED CEMR LAB
Albumin: 4.3 g/dL (ref 3.5–5.2)
BUN: 41 mg/dL — ABNORMAL HIGH (ref 6–23)
CO2: 19 meq/L (ref 19–32)
Glucose, Bld: 234 mg/dL — ABNORMAL HIGH (ref 70–99)
Hgb A1c MFr Bld: 9 % — ABNORMAL HIGH (ref 4.6–6.1)
Lithium Lvl: 2.62 meq/L (ref 0.80–1.40)
Potassium: 4.4 meq/L (ref 3.5–5.3)
Sodium: 136 meq/L (ref 135–145)
Total Bilirubin: 0.3 mg/dL (ref 0.3–1.2)
Total Protein: 6.5 g/dL (ref 6.0–8.3)

## 2008-09-09 ENCOUNTER — Telehealth: Payer: Self-pay | Admitting: Family Medicine

## 2008-09-10 ENCOUNTER — Ambulatory Visit: Payer: Self-pay | Admitting: Family Medicine

## 2008-09-27 ENCOUNTER — Inpatient Hospital Stay: Payer: Self-pay | Admitting: Unknown Physician Specialty

## 2008-10-14 ENCOUNTER — Encounter: Payer: Self-pay | Admitting: Family Medicine

## 2008-10-16 ENCOUNTER — Encounter: Payer: Self-pay | Admitting: Family Medicine

## 2008-10-18 ENCOUNTER — Emergency Department: Payer: Self-pay | Admitting: Emergency Medicine

## 2008-11-25 ENCOUNTER — Ambulatory Visit: Payer: Self-pay | Admitting: Family Medicine

## 2008-11-25 LAB — CONVERTED CEMR LAB
Bilirubin Urine: NEGATIVE
Urobilinogen, UA: 0.2
pH: 5

## 2008-11-28 ENCOUNTER — Emergency Department: Payer: Self-pay | Admitting: Unknown Physician Specialty

## 2008-12-13 ENCOUNTER — Emergency Department: Payer: Self-pay | Admitting: Unknown Physician Specialty

## 2008-12-22 IMAGING — CT CT ABD-PELV W/ CM
1 of 2 series · 15 of 32 positions shown, 19 images · non-contrast
Comparison: none

REASON FOR EXAM: (1) lower abdominal pain, evaluate for diverticulitis;
(2) lower abdominal pain,
COMMENTS:

[Series 2: appendicitis · axial · 0.77mm/px · z∈[-40,+388]mm · 15 of 157 slices shown, 19 images]
[im 7/157  soft-tissue]
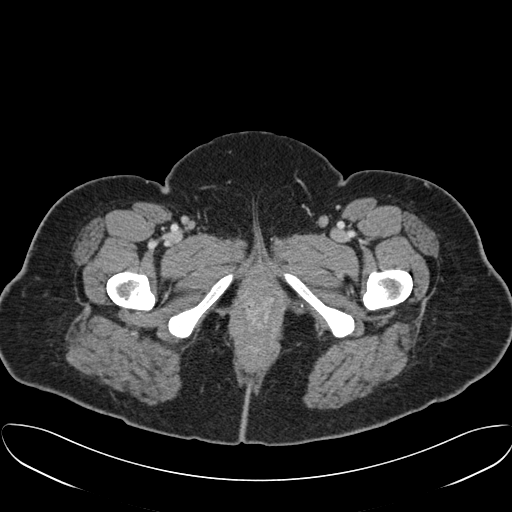
[im 7/157  bone]
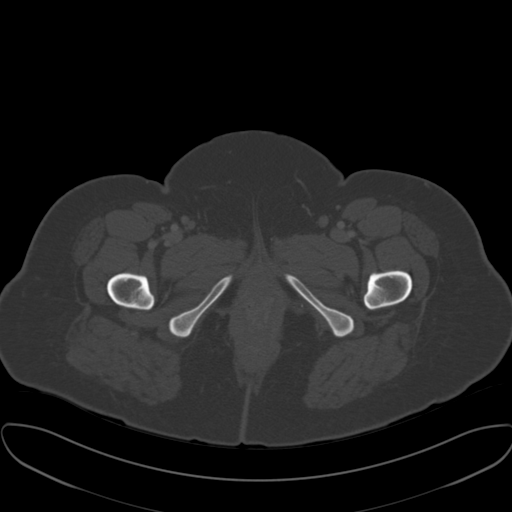
[im 19/157  soft-tissue]
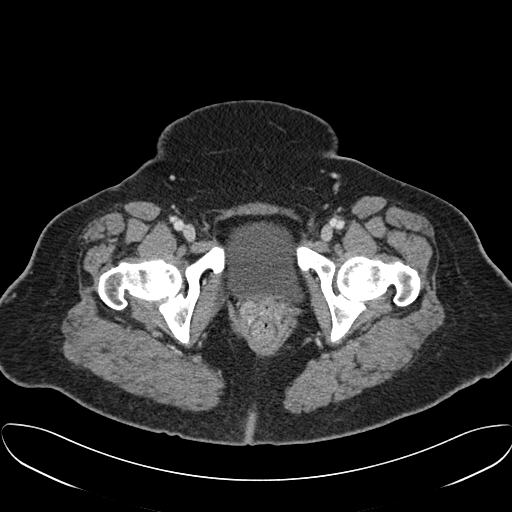
[im 32/157  soft-tissue]
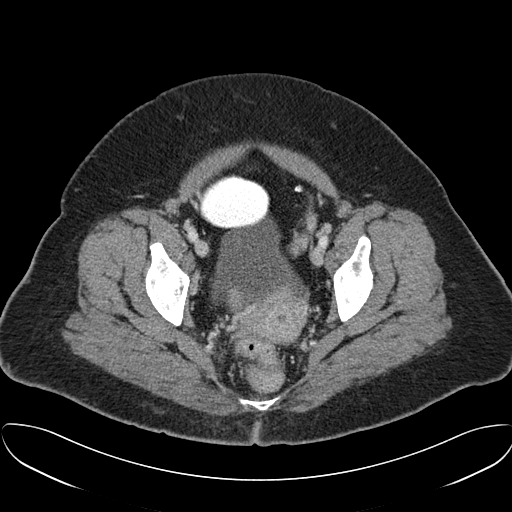
[im 44/157  soft-tissue]
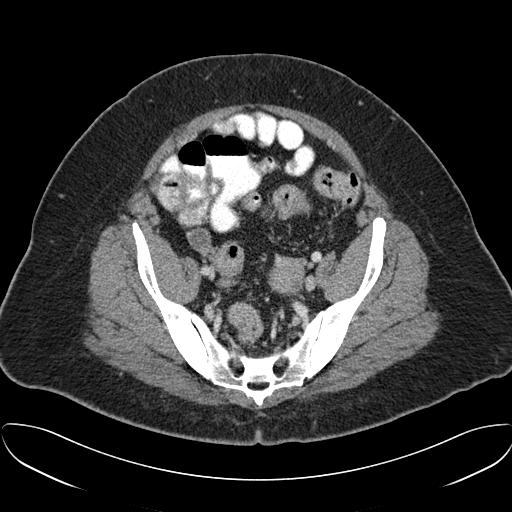
[im 57/157  soft-tissue]
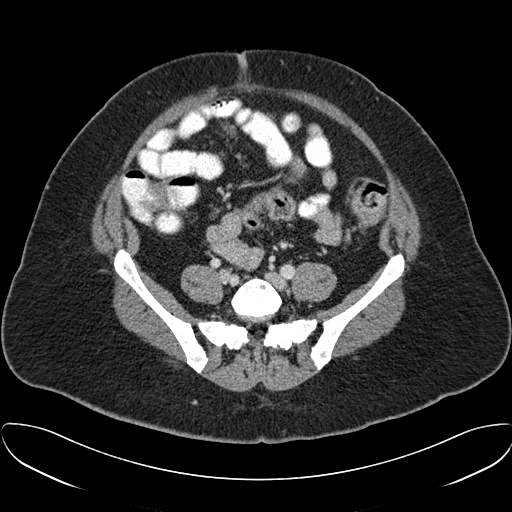
[im 69/157  soft-tissue]
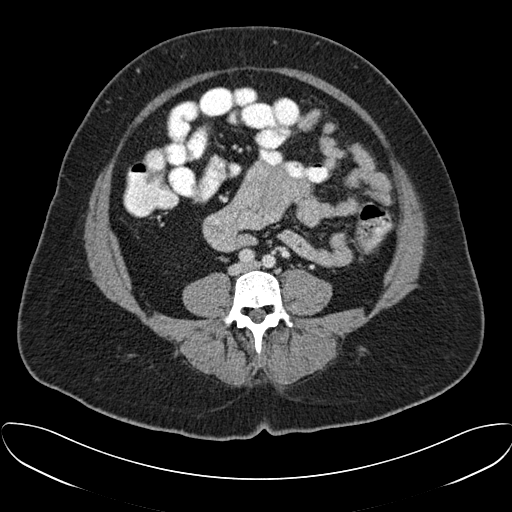
[im 82/157  soft-tissue]
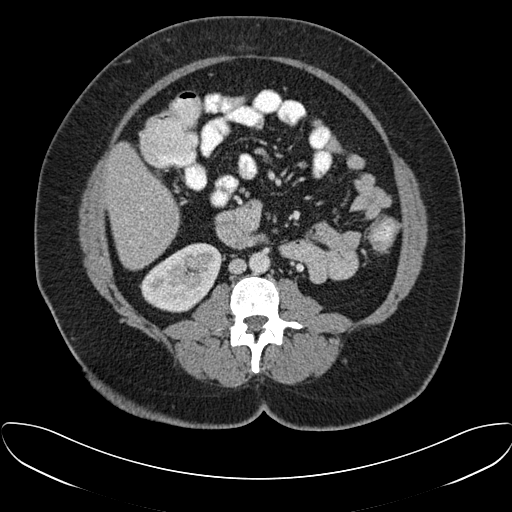
[im 88/157  soft-tissue]
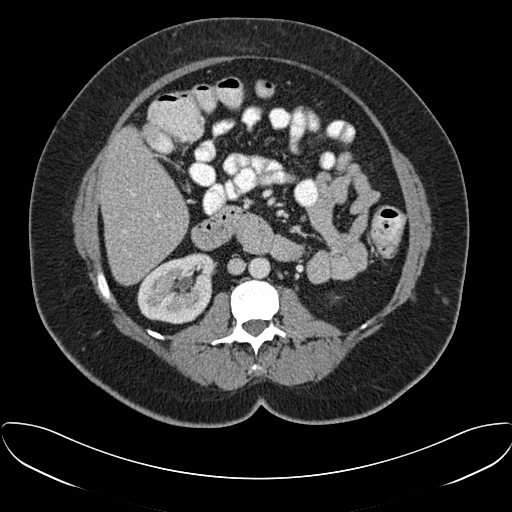
[im 100/157  soft-tissue]
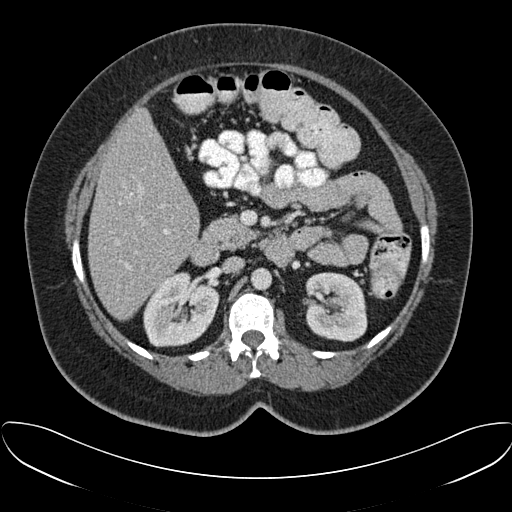
[im 100/157  bone]
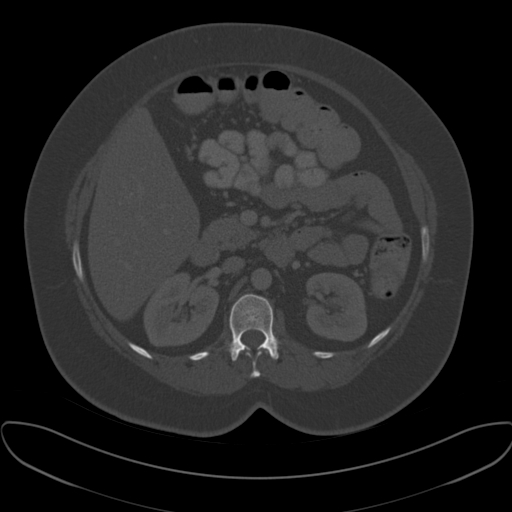
[im 113/157  soft-tissue]
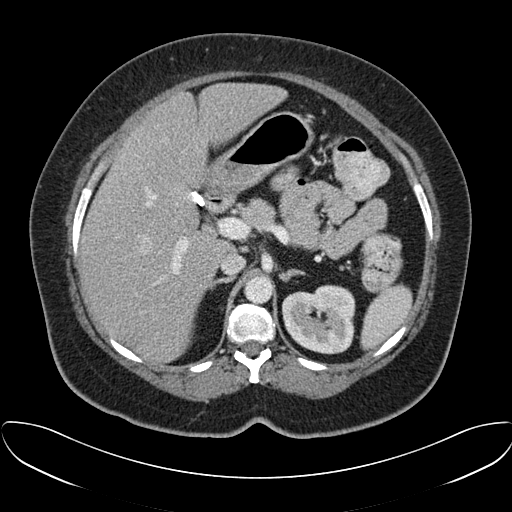
[im 125/157  soft-tissue]
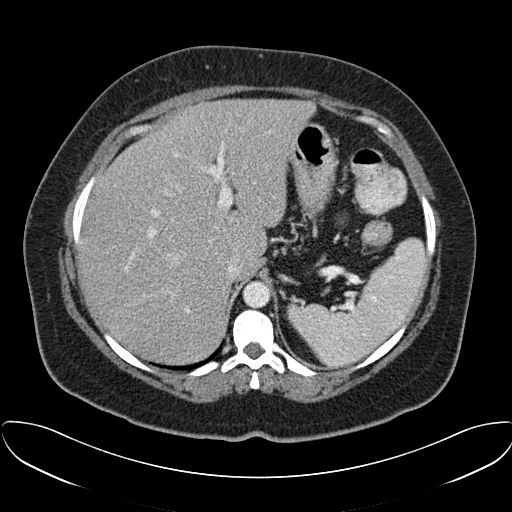
[im 132/157  lung]
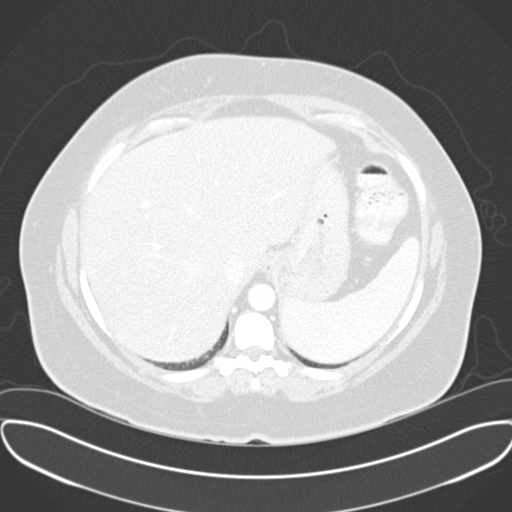
[im 138/157  soft-tissue]
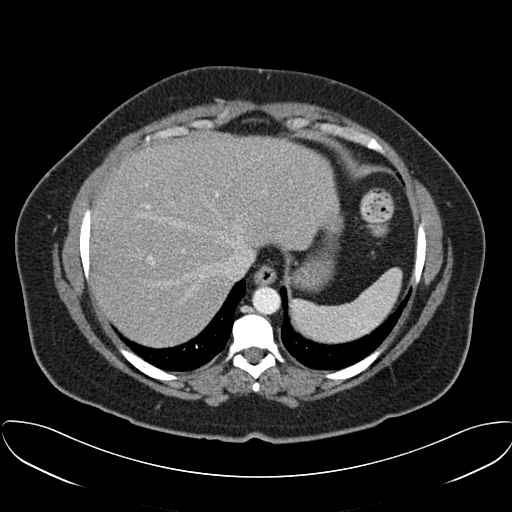
[im 138/157  lung]
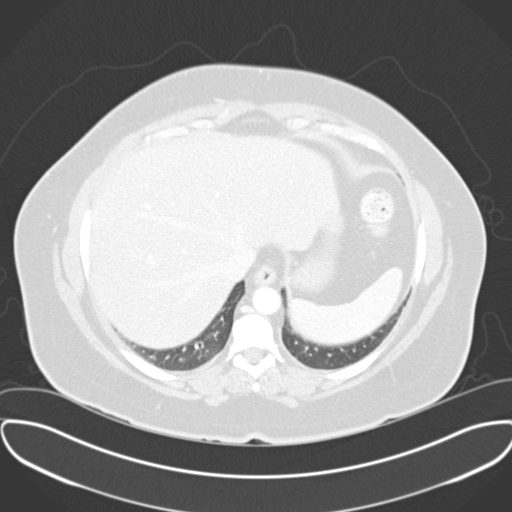
[im 144/157  lung]
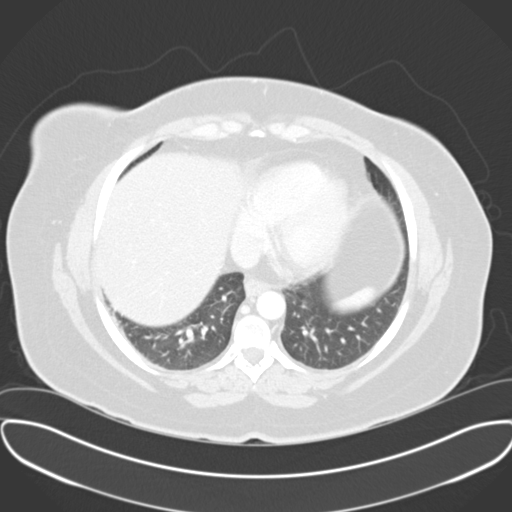
[im 150/157  soft-tissue]
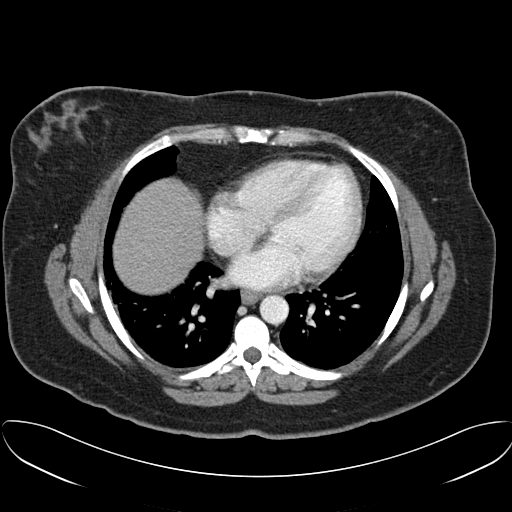
[im 150/157  lung]
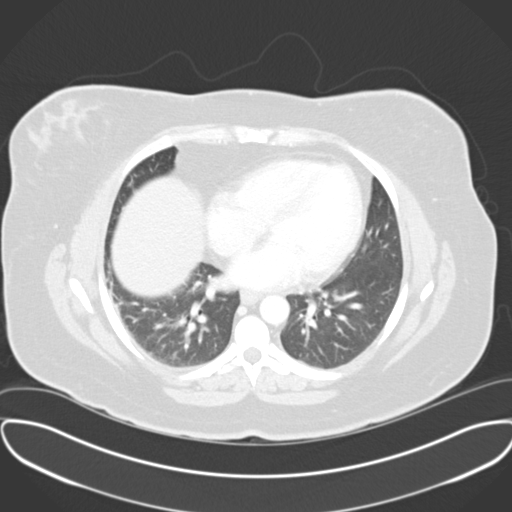

[15 of 32 positions shown; findings below may reference images not displayed]

PROCEDURE:     CT  - CT ABDOMEN / PELVIS  W  - May 21, 2007 [DATE]

RESULT:     Emergent CT of the abdomen and pelvis is performed utilizing 100
ml of [id number] and oral contrast. Images are reconstructed at 3 mm slice
thickness and compared to the prior noncontrast examination performed on
06/10/2005.

The lung bases demonstrate no pleural effusion. There is some basilar
atelectasis. There is no focal infiltrate or mass.

The liver shows slightly low attenuation consistent with fatty infiltration
but otherwise is unremarkable. Cholecystectomy clips are present. The
kidneys, aorta, adrenal glands, spleen and pancreas appear unremarkable.
There are unopacified loops of bowel present. There is no inflammatory
stranding or abnormal fluid collection. No free fluid or free air is
evident. Phleboliths are present. The appendix appears normal.
IMPRESSION: Mild fatty infiltration of the liver. No other significant
abnormality evident.

## 2009-01-04 ENCOUNTER — Ambulatory Visit: Payer: Self-pay | Admitting: Family Medicine

## 2009-01-04 LAB — CONVERTED CEMR LAB
HCT: 38.7 % (ref 36.0–46.0)
Platelets: 382 10*3/uL (ref 150.0–400.0)
Protein, U semiquant: 100
Urobilinogen, UA: 0.2
WBC: 9.9 10*3/uL (ref 4.5–10.5)

## 2009-01-05 ENCOUNTER — Encounter: Payer: Self-pay | Admitting: Family Medicine

## 2009-01-05 LAB — CONVERTED CEMR LAB
AST: 45 units/L — ABNORMAL HIGH (ref 0–37)
Albumin: 3.6 g/dL (ref 3.5–5.2)
BUN: 20 mg/dL (ref 6–23)
Calcium: 9.6 mg/dL (ref 8.4–10.5)
GFR calc non Af Amer: 51.58 mL/min (ref >60)
Glucose, Bld: 335 mg/dL — ABNORMAL HIGH (ref 70–99)
HCV Ab: NEGATIVE
Lithium Lvl: 1.41 meq/L — ABNORMAL HIGH (ref 0.80–1.40)
Potassium: 4.9 meq/L (ref 3.5–5.1)

## 2009-01-07 ENCOUNTER — Encounter (INDEPENDENT_AMBULATORY_CARE_PROVIDER_SITE_OTHER): Payer: Self-pay | Admitting: *Deleted

## 2009-01-07 ENCOUNTER — Inpatient Hospital Stay: Payer: Self-pay | Admitting: Internal Medicine

## 2009-01-08 ENCOUNTER — Inpatient Hospital Stay: Payer: Self-pay | Admitting: Psychiatry

## 2009-01-30 ENCOUNTER — Emergency Department: Payer: Self-pay | Admitting: Emergency Medicine

## 2009-02-25 ENCOUNTER — Inpatient Hospital Stay: Payer: Self-pay | Admitting: Psychiatry

## 2009-03-03 ENCOUNTER — Encounter: Payer: Self-pay | Admitting: Family Medicine

## 2009-03-04 ENCOUNTER — Ambulatory Visit: Payer: Self-pay | Admitting: Family Medicine

## 2009-03-04 DIAGNOSIS — G609 Hereditary and idiopathic neuropathy, unspecified: Secondary | ICD-10-CM | POA: Insufficient documentation

## 2009-03-04 DIAGNOSIS — N259 Disorder resulting from impaired renal tubular function, unspecified: Secondary | ICD-10-CM | POA: Insufficient documentation

## 2009-03-11 ENCOUNTER — Encounter (INDEPENDENT_AMBULATORY_CARE_PROVIDER_SITE_OTHER): Payer: Self-pay | Admitting: *Deleted

## 2009-03-12 ENCOUNTER — Telehealth: Payer: Self-pay | Admitting: Family Medicine

## 2009-03-14 IMAGING — CR DG CHEST 2V
1 series · 2 of 2 positions shown · non-contrast
Comparison: none

REASON FOR EXAM: cough
COMMENTS:

PROCEDURE:     DXR - DXR CHEST PA (OR AP) AND LATERAL  - August 11, 2007  [DATE]
RESULT:     Comparison is made to the prior exam of 01/16/07. The lung fields
are clear. The heart, mediastinal and osseous structures show no acute
changes.

[Series 1: view not recorded · 0.17mm/px · 2 of 2 slices shown]
[im 1/2]
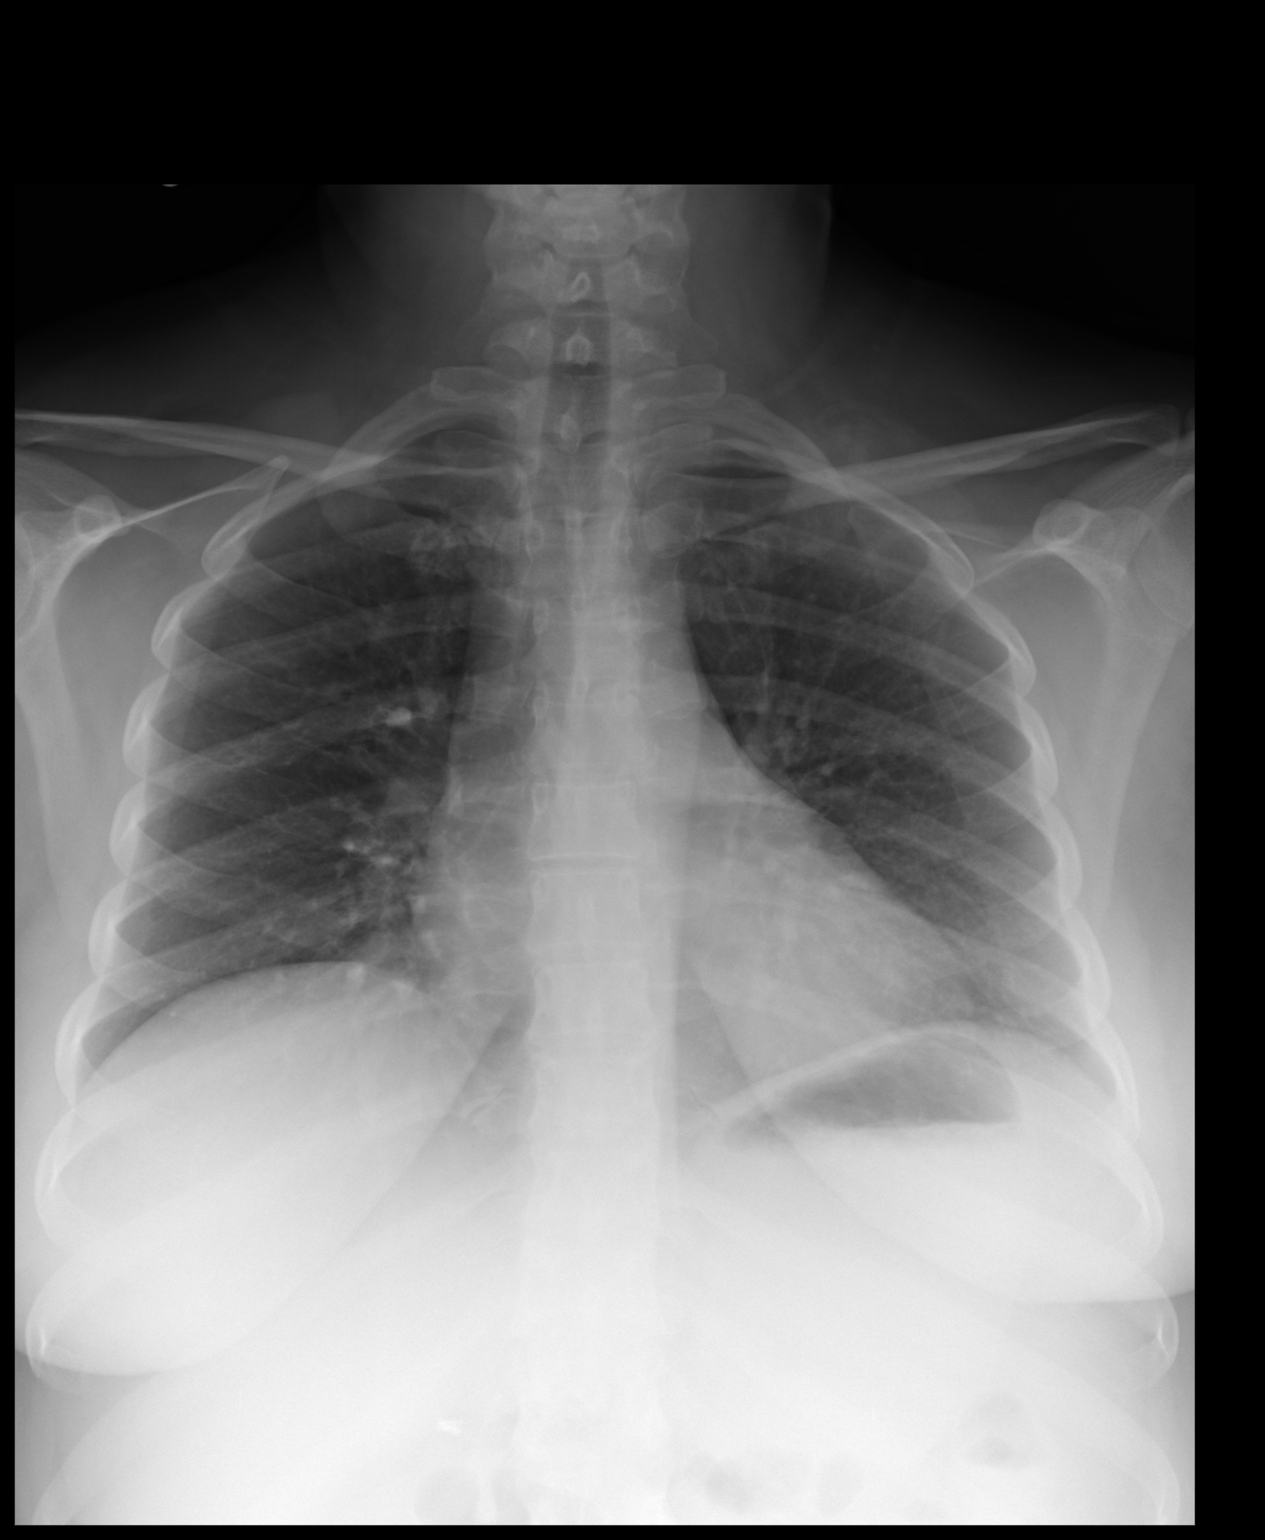
[im 2/2]
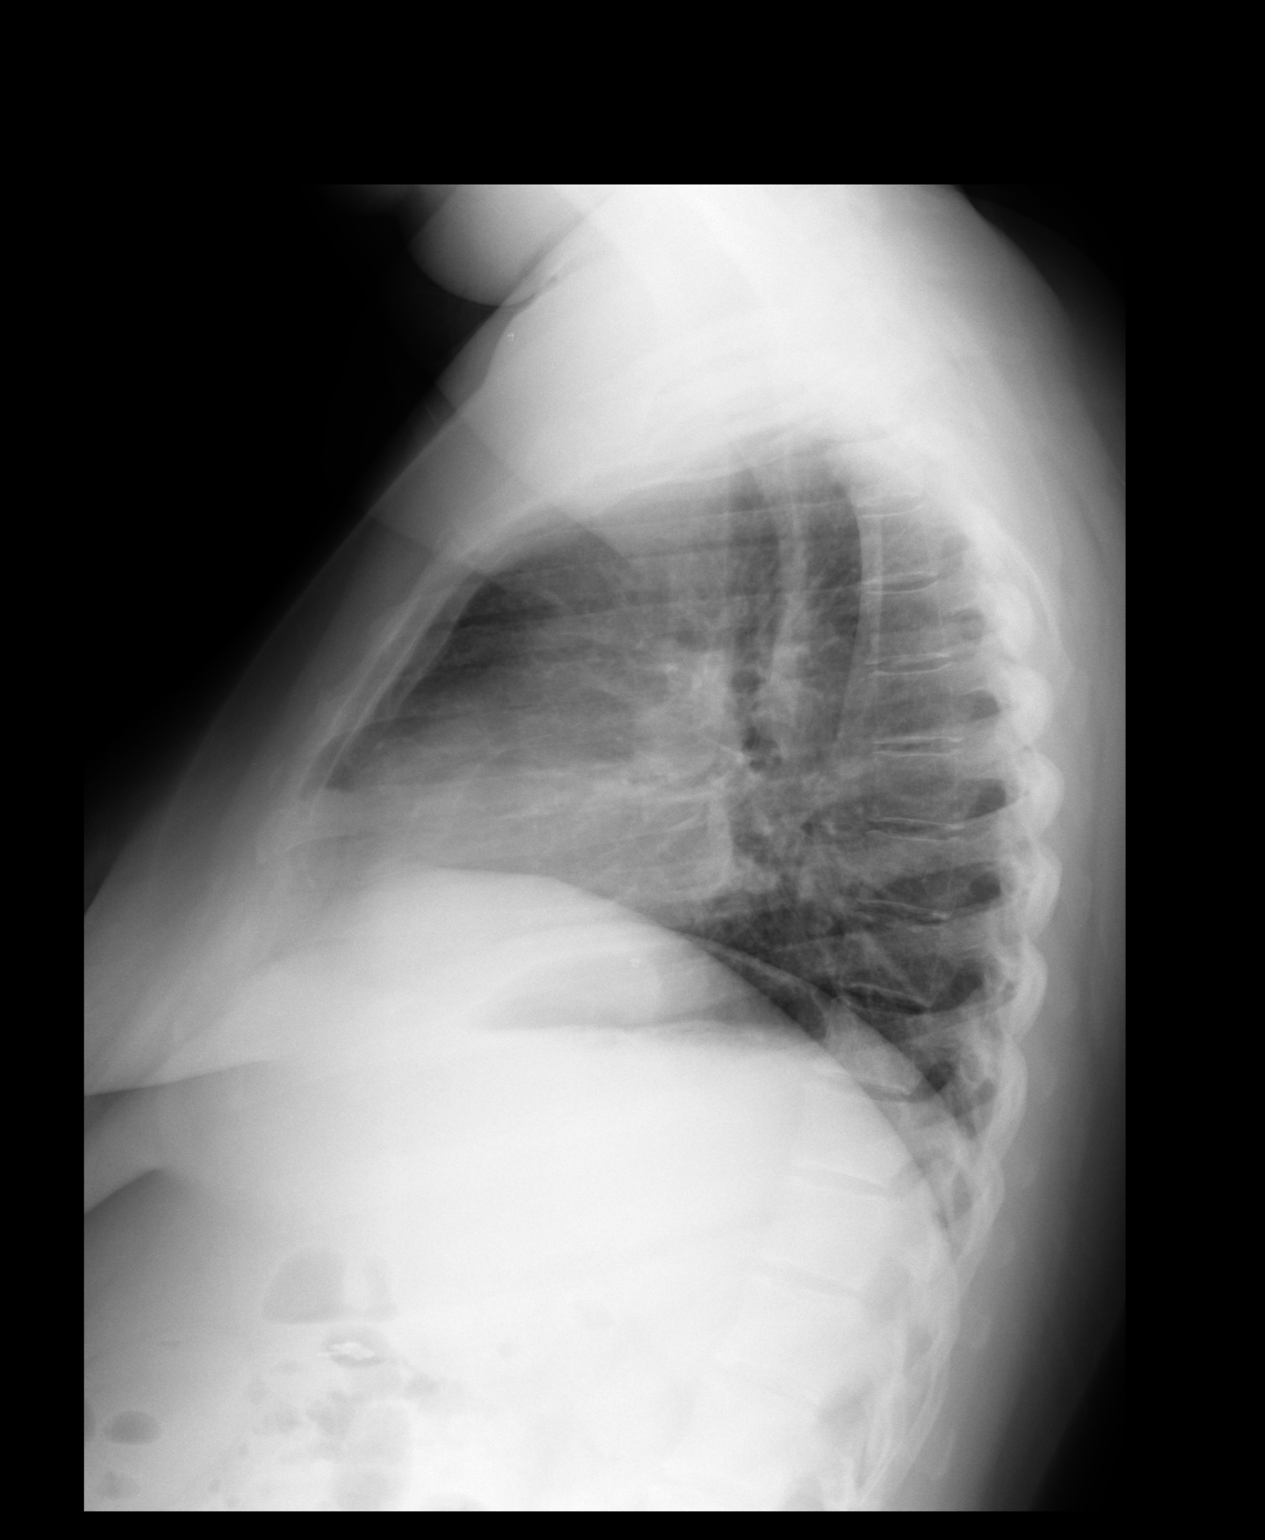

[2 of 2 positions shown; findings below may reference images not displayed]

IMPRESSION: No acute changes are identified.

## 2009-03-31 ENCOUNTER — Telehealth: Payer: Self-pay | Admitting: Family Medicine

## 2009-04-07 ENCOUNTER — Telehealth: Payer: Self-pay | Admitting: Family Medicine

## 2009-04-07 ENCOUNTER — Ambulatory Visit: Payer: Self-pay | Admitting: Family Medicine

## 2009-04-07 DIAGNOSIS — N61 Mastitis without abscess: Secondary | ICD-10-CM | POA: Insufficient documentation

## 2009-04-08 ENCOUNTER — Telehealth: Payer: Self-pay | Admitting: Family Medicine

## 2009-04-09 ENCOUNTER — Encounter: Payer: Self-pay | Admitting: Family Medicine

## 2009-04-26 ENCOUNTER — Inpatient Hospital Stay: Payer: Self-pay | Admitting: Specialist

## 2009-04-29 ENCOUNTER — Encounter: Payer: Self-pay | Admitting: Family Medicine

## 2009-05-24 ENCOUNTER — Emergency Department: Payer: Self-pay | Admitting: Internal Medicine

## 2009-05-30 ENCOUNTER — Emergency Department: Payer: Self-pay | Admitting: Internal Medicine

## 2009-05-31 ENCOUNTER — Telehealth: Payer: Self-pay | Admitting: Family Medicine

## 2009-06-13 IMAGING — CR DG KNEE COMPLETE 4+V*L*
1 series · 4 of 4 positions shown · non-contrast
Comparison: none

REASON FOR EXAM: fell today
COMMENTS:

PROCEDURE:     DXR - DXR KNEE LT COMP WITH OBLIQUES  - November 10, 2007  [DATE]
RESULT:     Comparison: No available comparison exam.

[Series 1: view not recorded · 0.17mm/px · 4 of 4 slices shown]
[im 1/4]
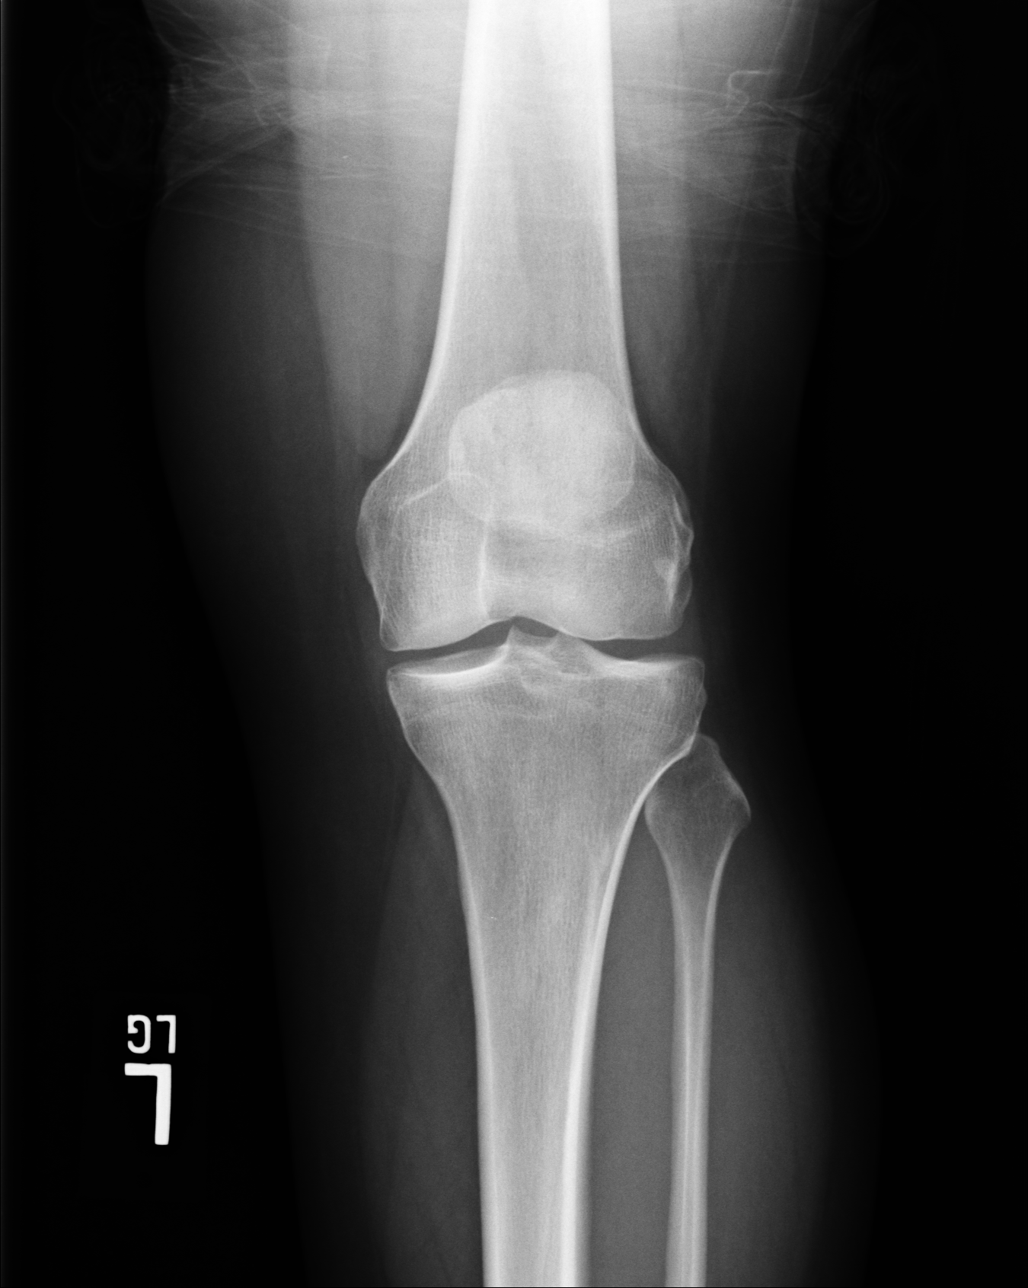
[im 2/4]
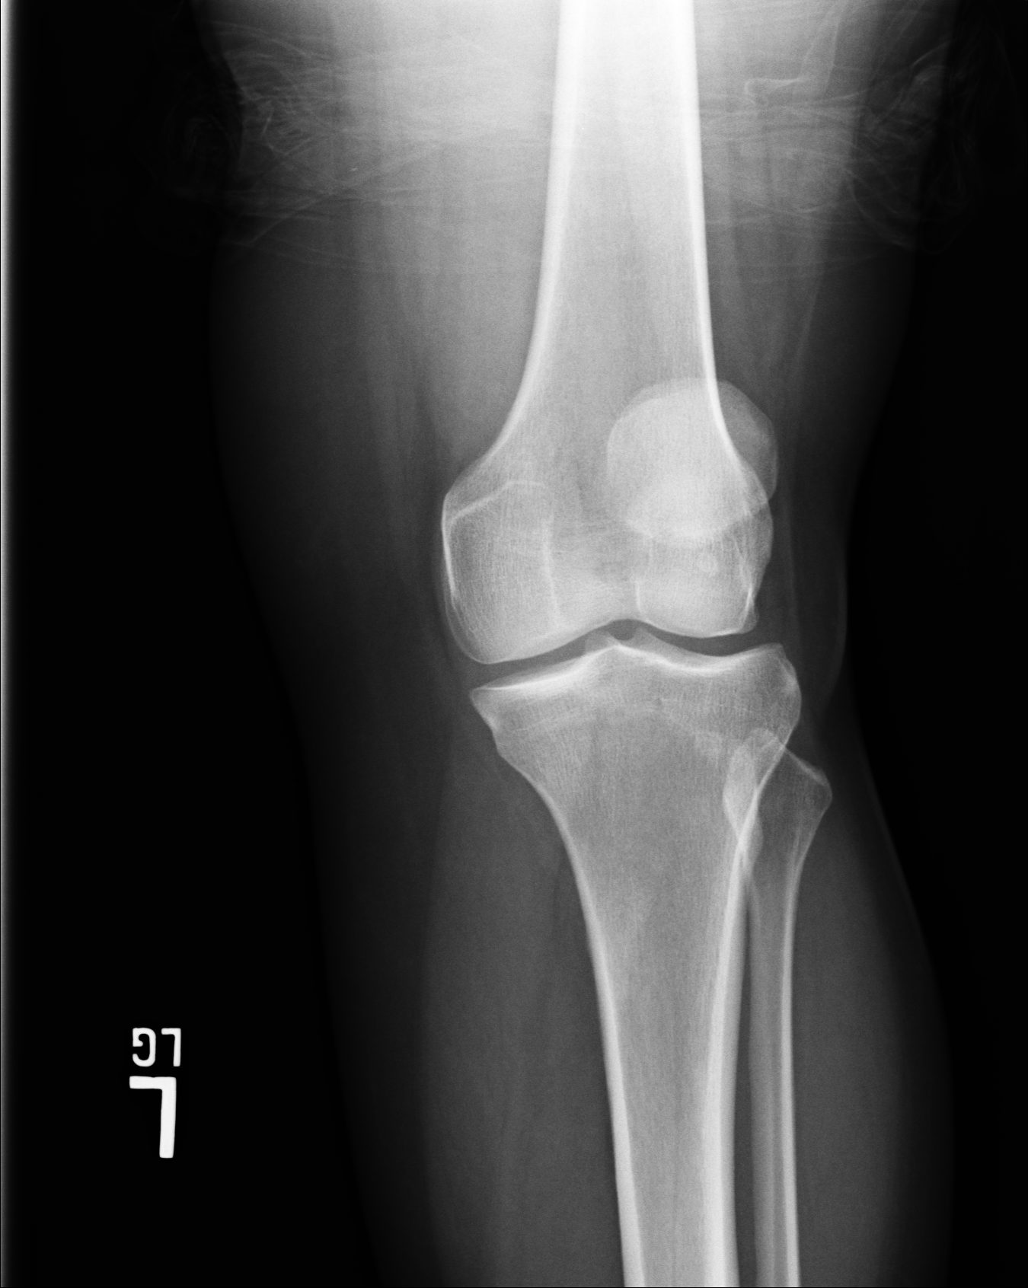
[im 3/4]
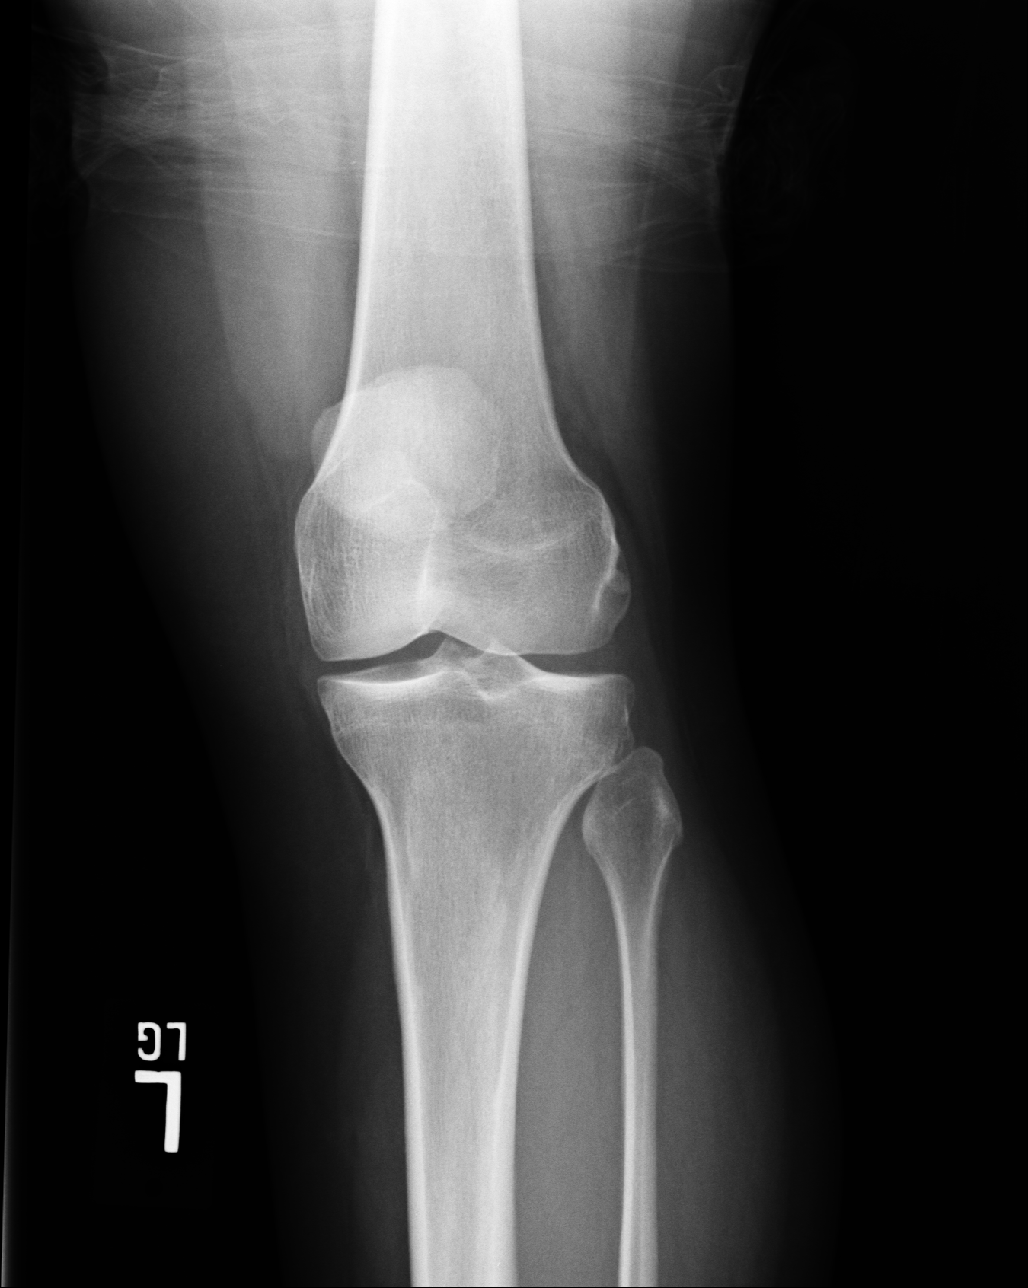
[im 4/4]
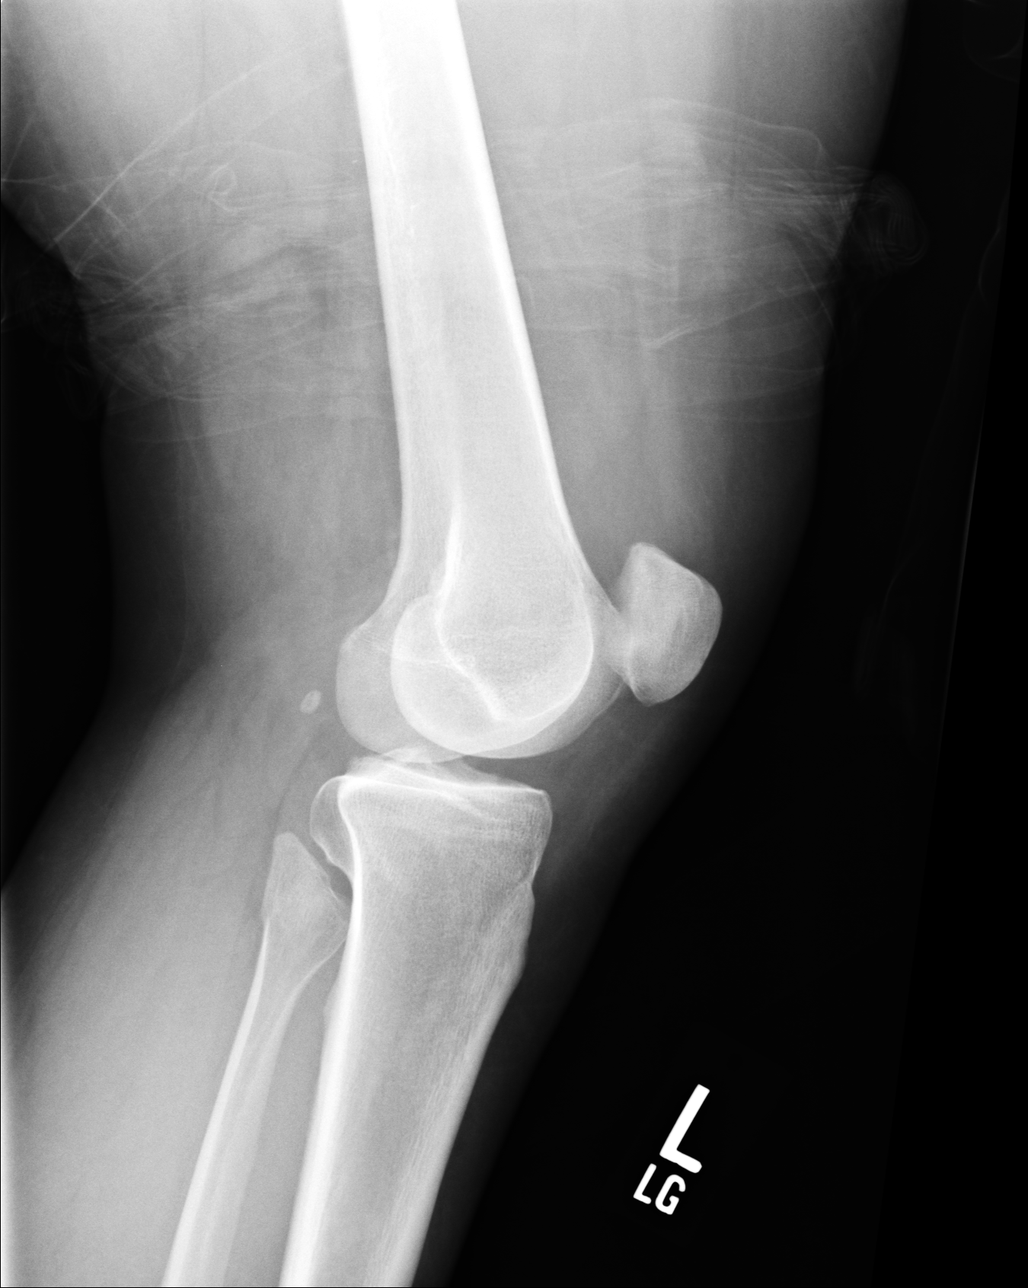

[4 of 4 positions shown; findings below may reference images not displayed]

FINDINGS: Four views of the left knee were obtained.

There is no significant left knee effusion. No fracture or dislocation of
the left knee is noted. No significant degenerative changes are seen.
IMPRESSION: 1. No fracture or dislocation of the left knee is noted.

## 2009-06-30 ENCOUNTER — Telehealth: Payer: Self-pay | Admitting: Family Medicine

## 2009-07-13 ENCOUNTER — Emergency Department: Payer: Self-pay | Admitting: Emergency Medicine

## 2009-07-15 IMAGING — CT CT HEAD WITHOUT CONTRAST
2 series · 16 of 30 positions shown, 20 images · non-contrast
Comparison: none

REASON FOR EXAM: fall closed head injury
COMMENTS:

[Series 2: without · axial · non-contrast · 0.38mm/px · z∈[+1251,+1371]mm · 13 of 29 slices shown, 17 images]
[im 3/29  brain]
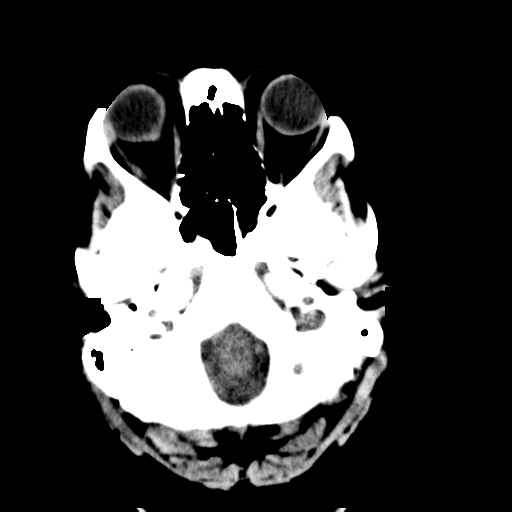
[im 3/29  bone]
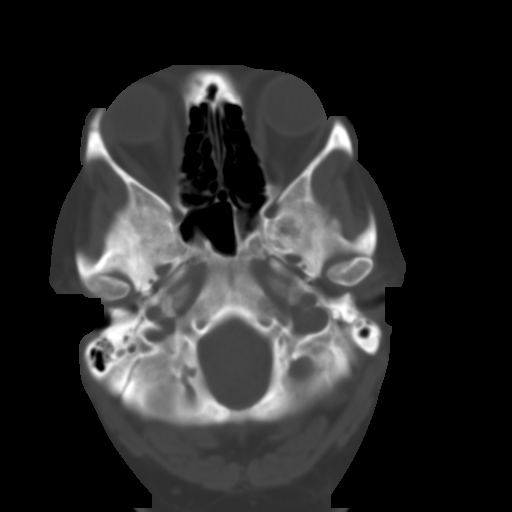
[im 5/29  brain]
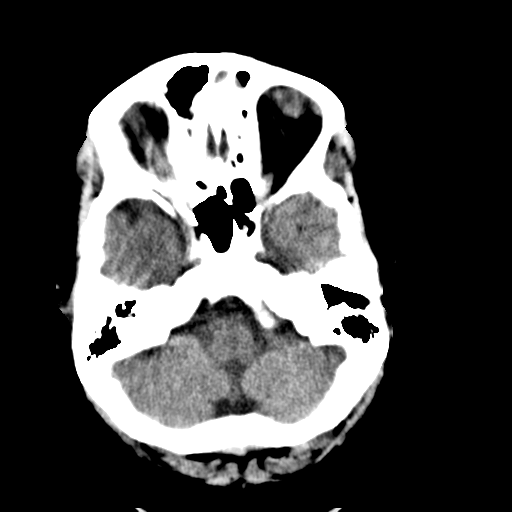
[im 7/29  brain]
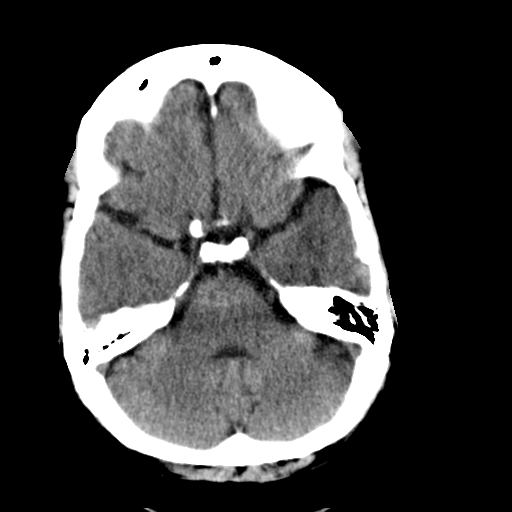
[im 9/29  brain]
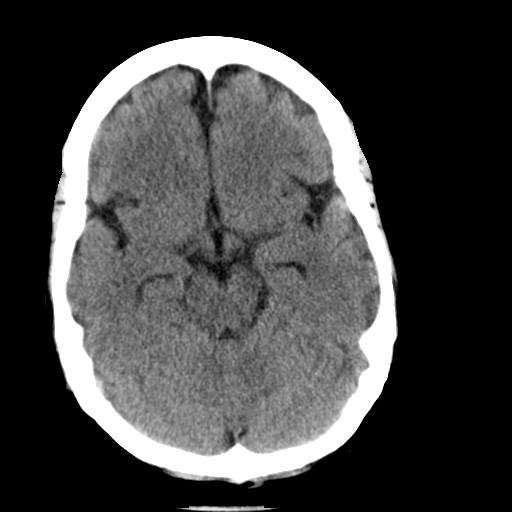
[im 11/29  brain]
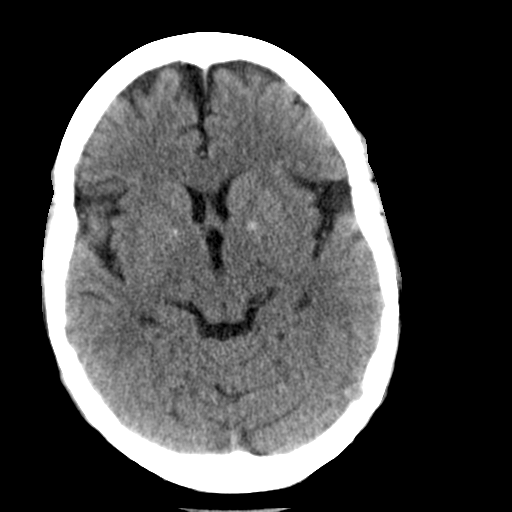
[im 11/29  bone]
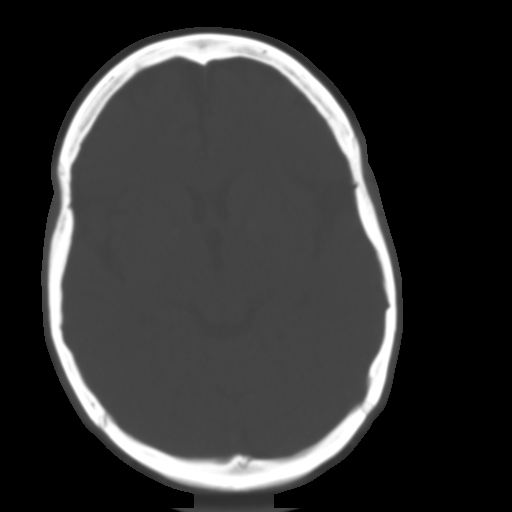
[im 13/29  brain]
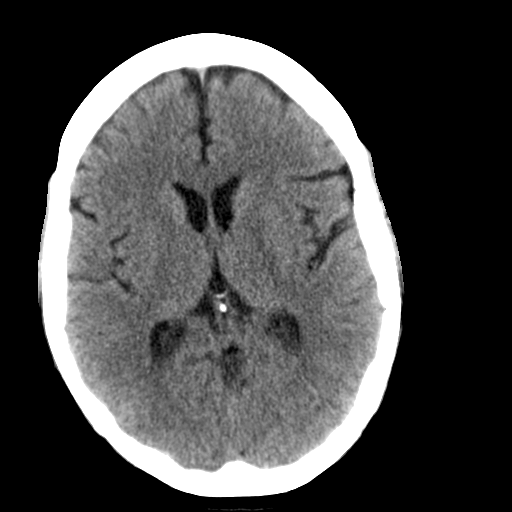
[im 15/29  brain]
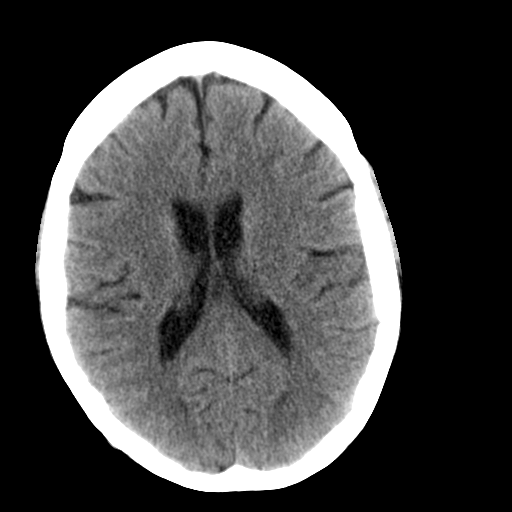
[im 17/29  brain]
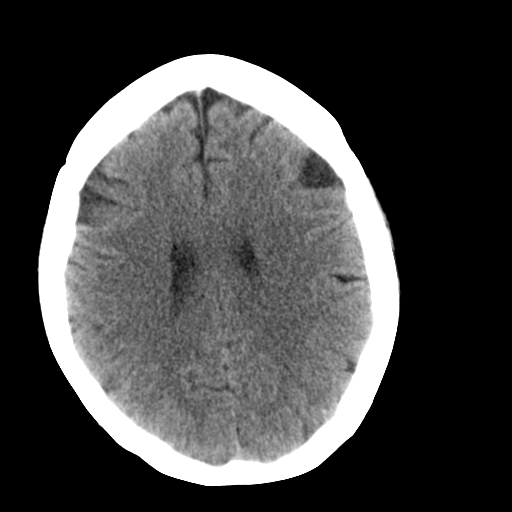
[im 19/29  brain]
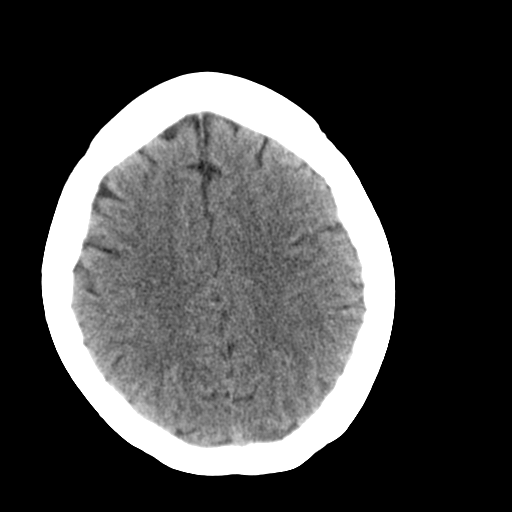
[im 19/29  bone]
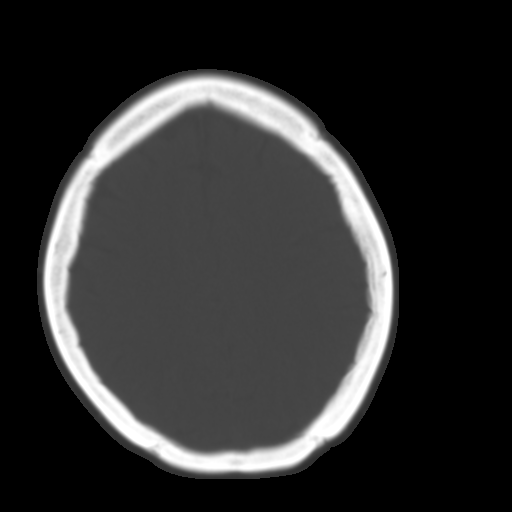
[im 21/29  brain]
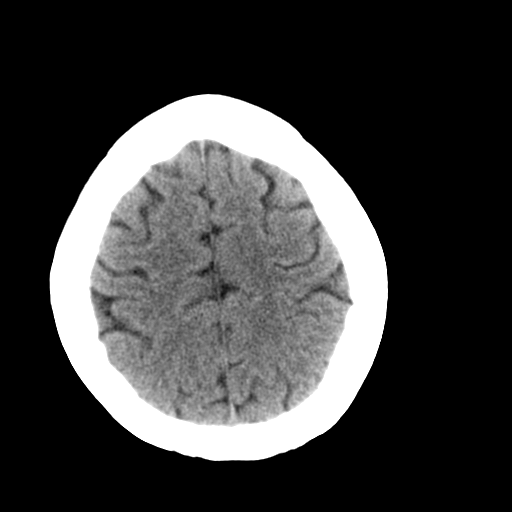
[im 23/29  brain]
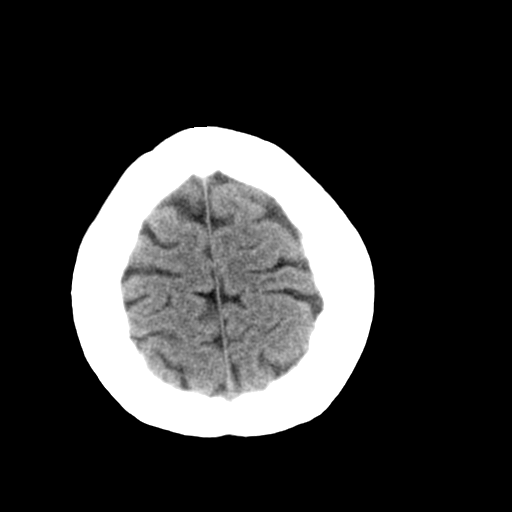
[im 25/29  brain]
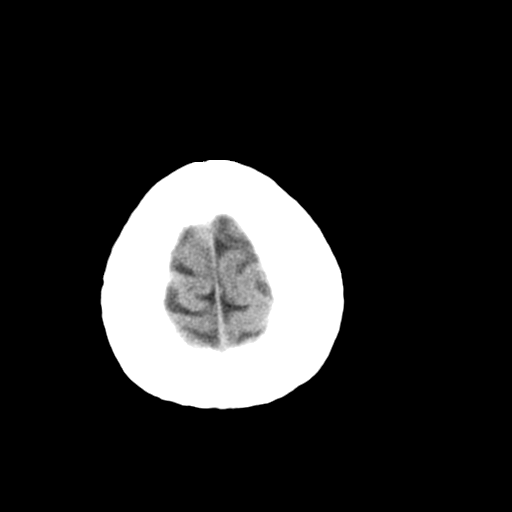
[im 27/29  brain]
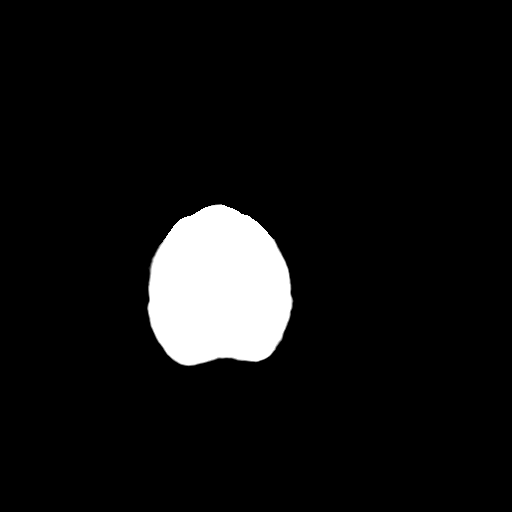
[im 27/29  bone]
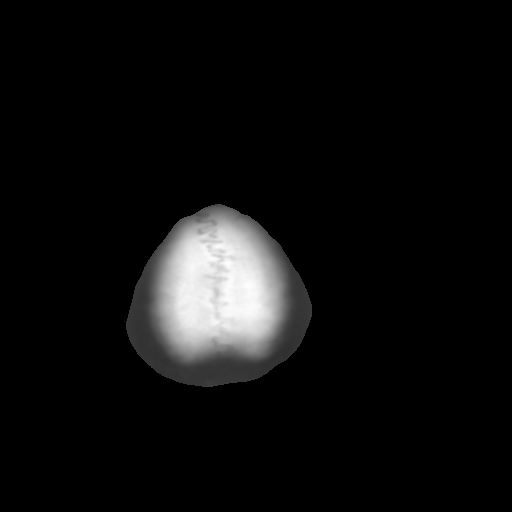

[Series 3: bone · axial · 0.38mm/px · z∈[+1251,+1291]mm · 3 of 29 slices shown]
[im 3/29  bone]
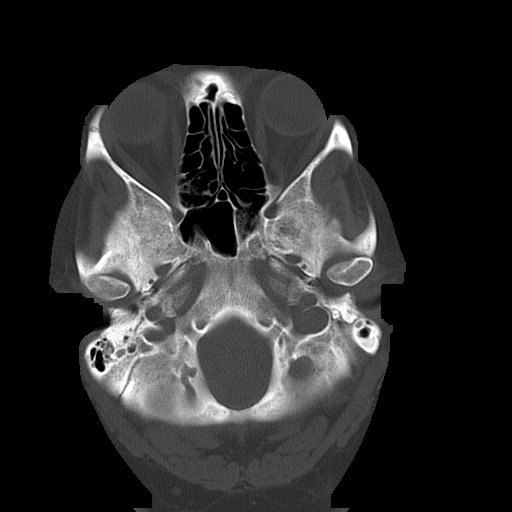
[im 7/29  bone]
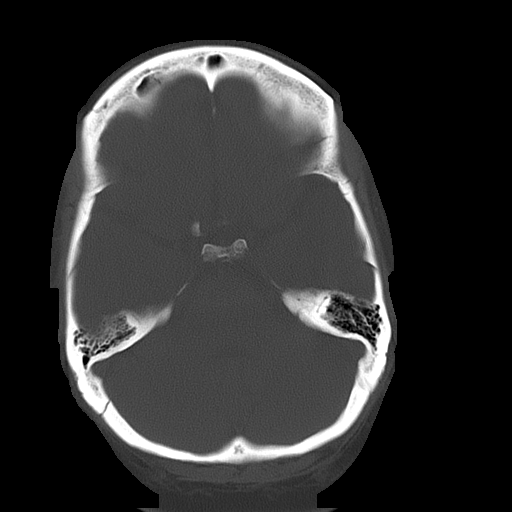
[im 11/29  bone]
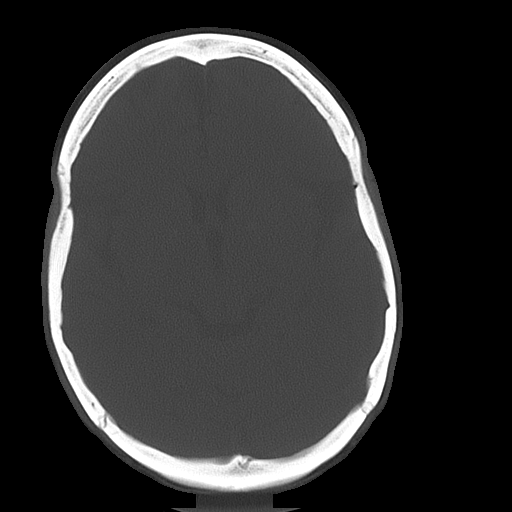

[16 of 30 positions shown; findings below may reference images not displayed]

PROCEDURE:     CT  - CT HEAD WITHOUT CONTRAST  - December 12, 2007  [DATE]

RESULT:     Comparison is made to a prior study dated 09/09/06.

There is no evidence of intraaxial or extraaxial fluid collections or
evidence of acute hemorrhage. The visualized bony skeleton demonstrates no
evidence of fracture or dislocation.
IMPRESSION: 1.     No evidence of focal or acute intracranial abnormalities.
2.     Dr. Zerpa of the Emergency Department was informed of these
findings via preliminary fax report on 12/12/07 at [DATE] p.m. CST.  If there is
persistent clinical concern, further evaluation with brain MRI is
recommended if clinically warranted.

## 2009-07-19 ENCOUNTER — Encounter: Payer: Self-pay | Admitting: Family Medicine

## 2009-08-02 ENCOUNTER — Telehealth: Payer: Self-pay | Admitting: Family Medicine

## 2009-08-07 ENCOUNTER — Emergency Department: Payer: Self-pay | Admitting: Emergency Medicine

## 2009-08-17 ENCOUNTER — Telehealth: Payer: Self-pay | Admitting: Family Medicine

## 2009-08-26 ENCOUNTER — Telehealth: Payer: Self-pay | Admitting: Family Medicine

## 2009-10-28 IMAGING — CT CT ABD-PELV W/ CM
1 of 2 series · 15 of 32 positions shown, 19 images · non-contrast
Comparison: none

REASON FOR EXAM: (1) abd pain; (2) pelvic pain
COMMENTS:

PROCEDURE:     CT  - CT ABDOMEN / PELVIS  W  - March 26, 2008  [DATE]
RESULT:
HISTORY: Abdominal pain.
COMPARISON STUDIES:  Prior CT of the abdomen and pelvis of 05/21/2007.

[Series 2: abdomen · axial · 0.60mm/px · z∈[-474,-58]mm · 15 of 58 slices shown, 19 images]
[im 3/58  soft-tissue]
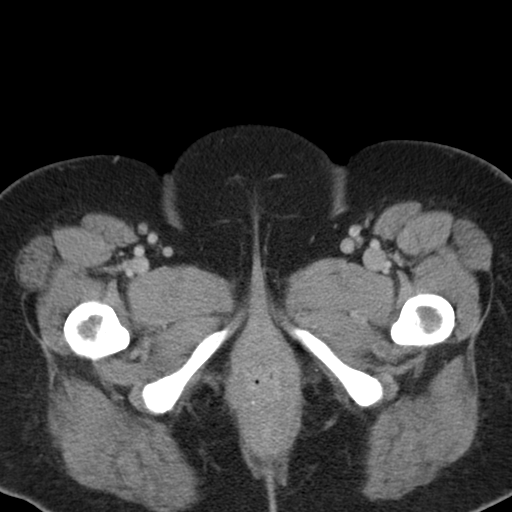
[im 3/58  bone]
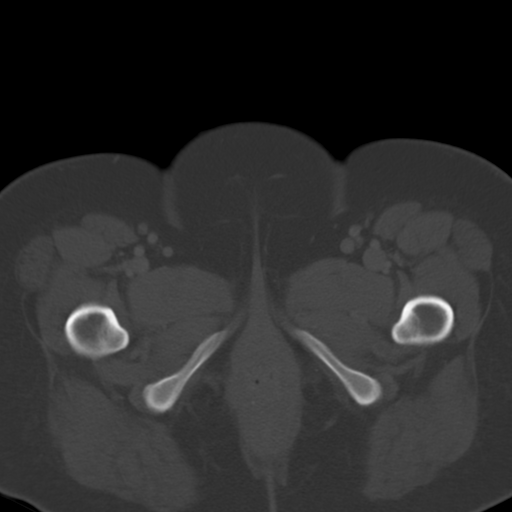
[im 7/58  soft-tissue]
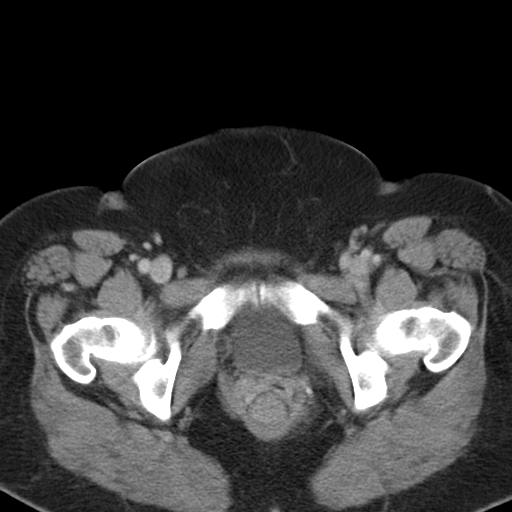
[im 12/58  soft-tissue]
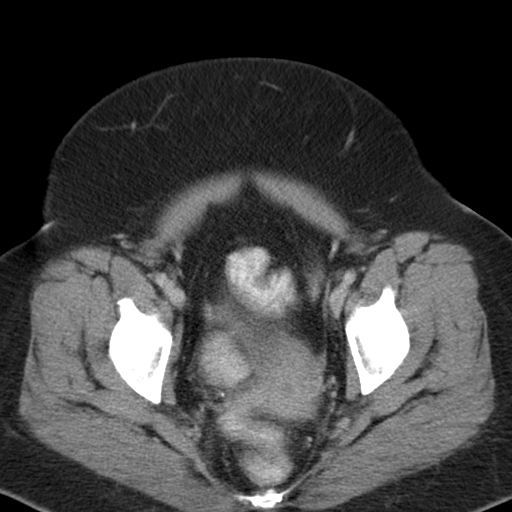
[im 16/58  soft-tissue]
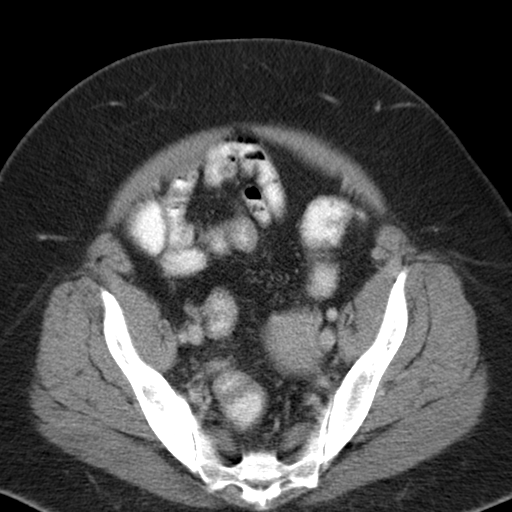
[im 21/58  soft-tissue]
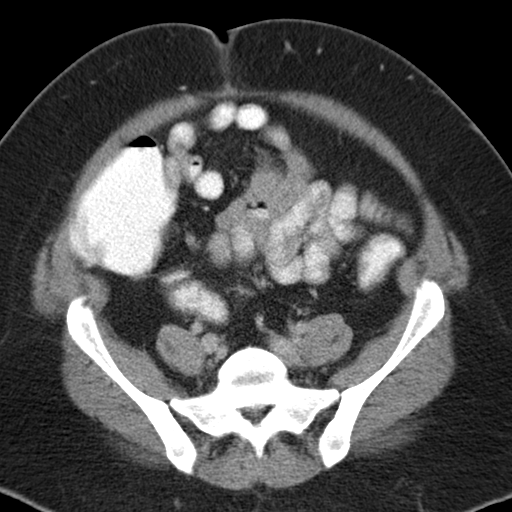
[im 26/58  soft-tissue]
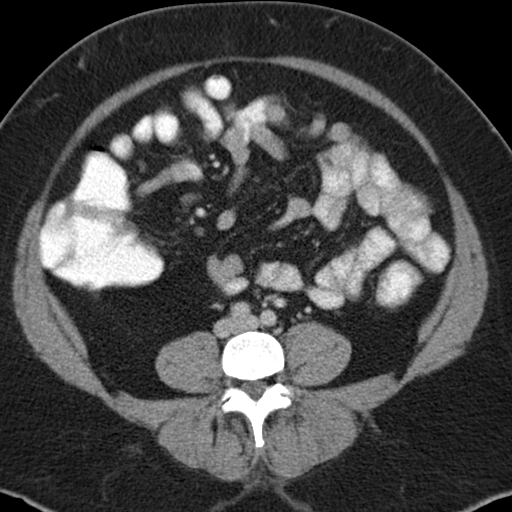
[im 30/58  soft-tissue]
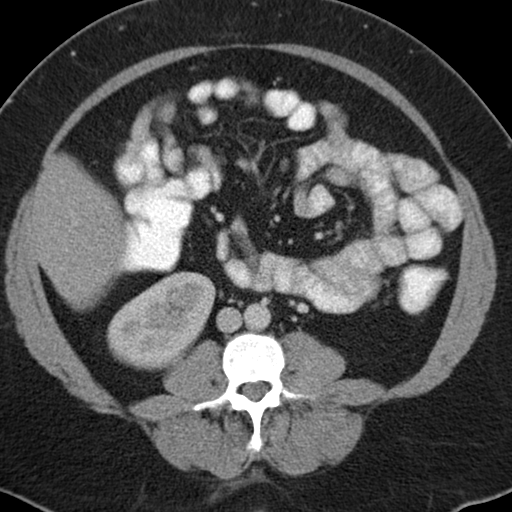
[im 32/58  soft-tissue]
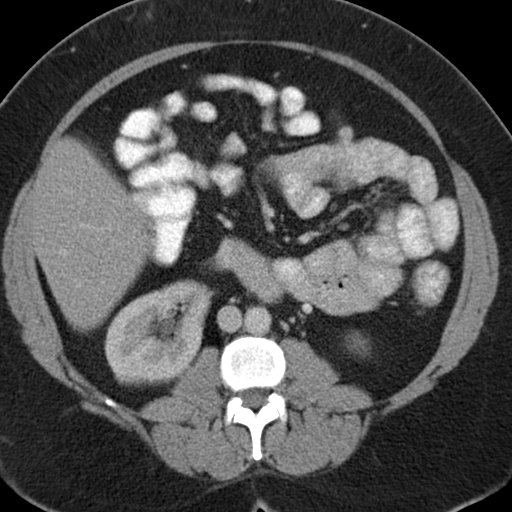
[im 37/58  soft-tissue]
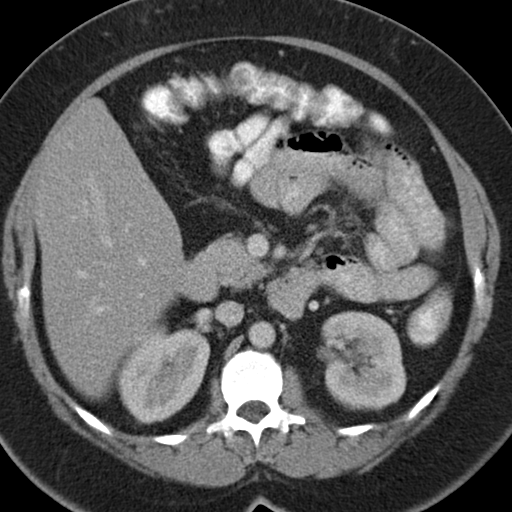
[im 37/58  bone]
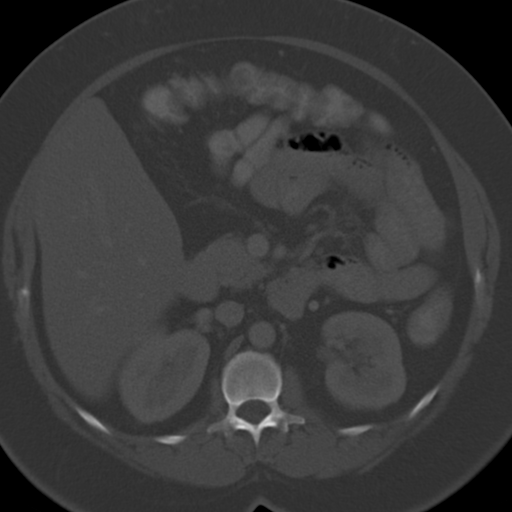
[im 42/58  soft-tissue]
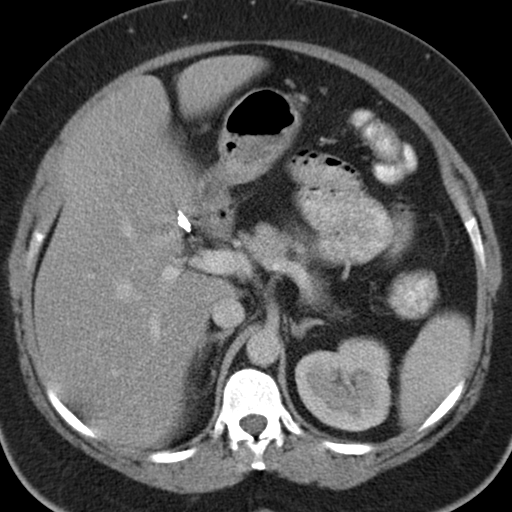
[im 46/58  soft-tissue]
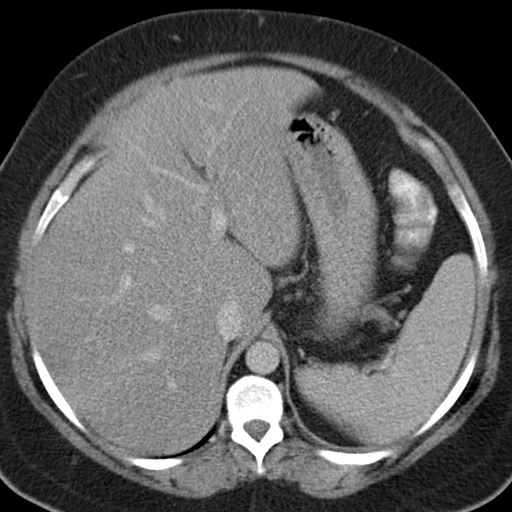
[im 48/58  lung]
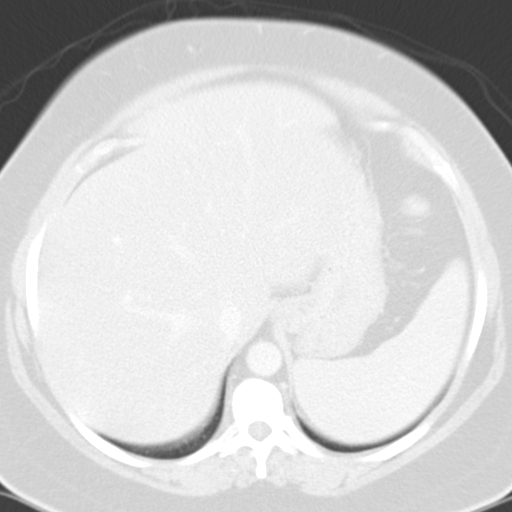
[im 51/58  soft-tissue]
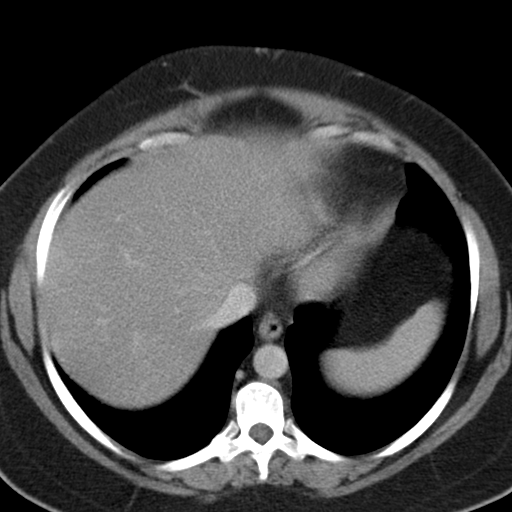
[im 51/58  lung]
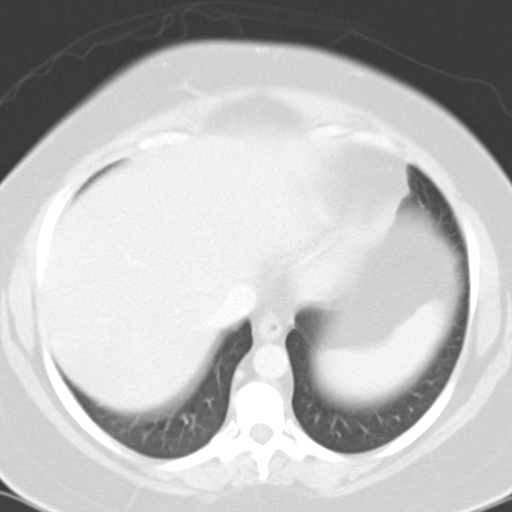
[im 53/58  lung]
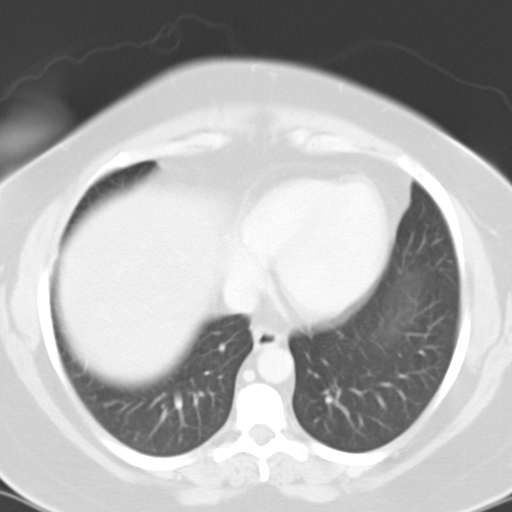
[im 55/58  soft-tissue]
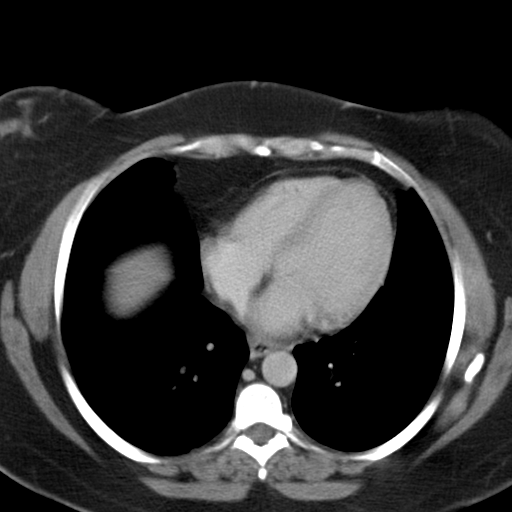
[im 55/58  lung]
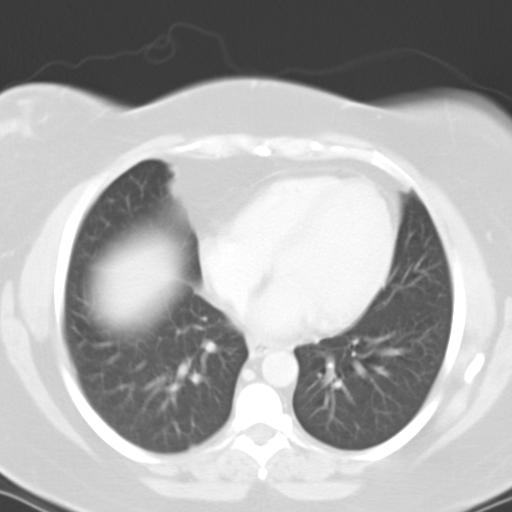

[15 of 32 positions shown; findings below may reference images not displayed]

FINDINGS: IV and oral contrast-enhanced CT of the abdomen and pelvis is
obtained.  Diffuse fatty infiltration of the liver is present.  The hepatic
portal veins are patent. The pancreas is normal. The adrenals are normal. No
focal renal abnormalities are identified. Surgical clips are noted in the
RIGHT upper quadrant. Small bowel distention is noted.  This is present
proximally.  A partial small bowel obstruction cannot be excluded. If
present, the partial small bowel obstruction would be mild. The abdominal
aorta is unremarkable.  The RIGHT lower quadrant is unremarkable. The
appendix is not definitely identified. No pelvic lesions are noted. No
inguinal adenopathy is noted.  The lung bases are clear.
IMPRESSION: Mild proximal small bowel dilatation, and very mild partial
small bowel obstruction cannot be excluded.  Mild ileus cannot excluded.
Incidental notes is made of mild small lymph nodes in this region.  Mild
mesenteric adenitis could also present in this fashion.

## 2009-10-29 ENCOUNTER — Emergency Department: Payer: Self-pay | Admitting: Emergency Medicine

## 2009-10-31 ENCOUNTER — Emergency Department: Payer: Self-pay | Admitting: Emergency Medicine

## 2009-11-16 ENCOUNTER — Inpatient Hospital Stay: Payer: Self-pay | Admitting: Internal Medicine

## 2009-11-25 ENCOUNTER — Telehealth: Payer: Self-pay | Admitting: Family Medicine

## 2009-12-20 ENCOUNTER — Emergency Department: Payer: Self-pay

## 2009-12-23 ENCOUNTER — Ambulatory Visit: Payer: Self-pay | Admitting: Unknown Physician Specialty

## 2010-01-03 ENCOUNTER — Emergency Department: Payer: Self-pay | Admitting: Emergency Medicine

## 2010-01-22 ENCOUNTER — Emergency Department: Payer: Self-pay | Admitting: Emergency Medicine

## 2010-02-03 ENCOUNTER — Emergency Department: Payer: Self-pay | Admitting: Emergency Medicine

## 2010-03-30 ENCOUNTER — Telehealth (INDEPENDENT_AMBULATORY_CARE_PROVIDER_SITE_OTHER): Payer: Self-pay | Admitting: *Deleted

## 2010-04-11 ENCOUNTER — Ambulatory Visit: Payer: Self-pay | Admitting: Unknown Physician Specialty

## 2010-04-13 ENCOUNTER — Ambulatory Visit: Payer: Self-pay | Admitting: Unknown Physician Specialty

## 2010-04-15 LAB — PATHOLOGY REPORT

## 2010-04-27 ENCOUNTER — Telehealth: Payer: Self-pay | Admitting: Family Medicine

## 2010-05-22 IMAGING — CR DG ABDOMEN 3V
1 series · 4 of 4 positions shown · non-contrast
Comparison: none

REASON FOR EXAM: abdominal pain, nausea
COMMENTS:

[Series 1: view not recorded · 0.17mm/px · 4 of 4 slices shown]
[im 1/4]
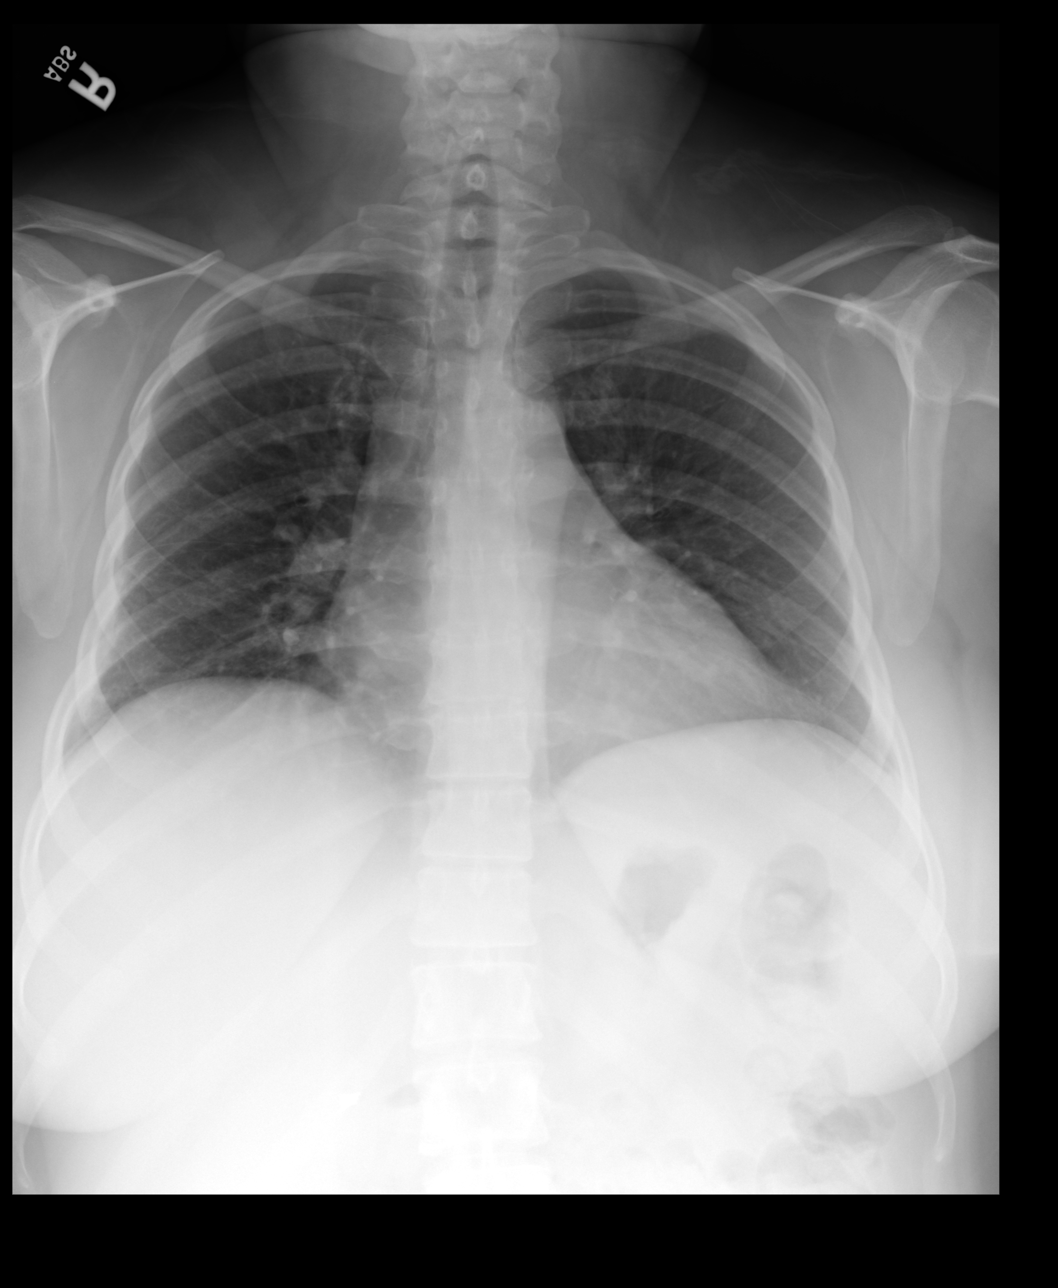
[im 2/4]
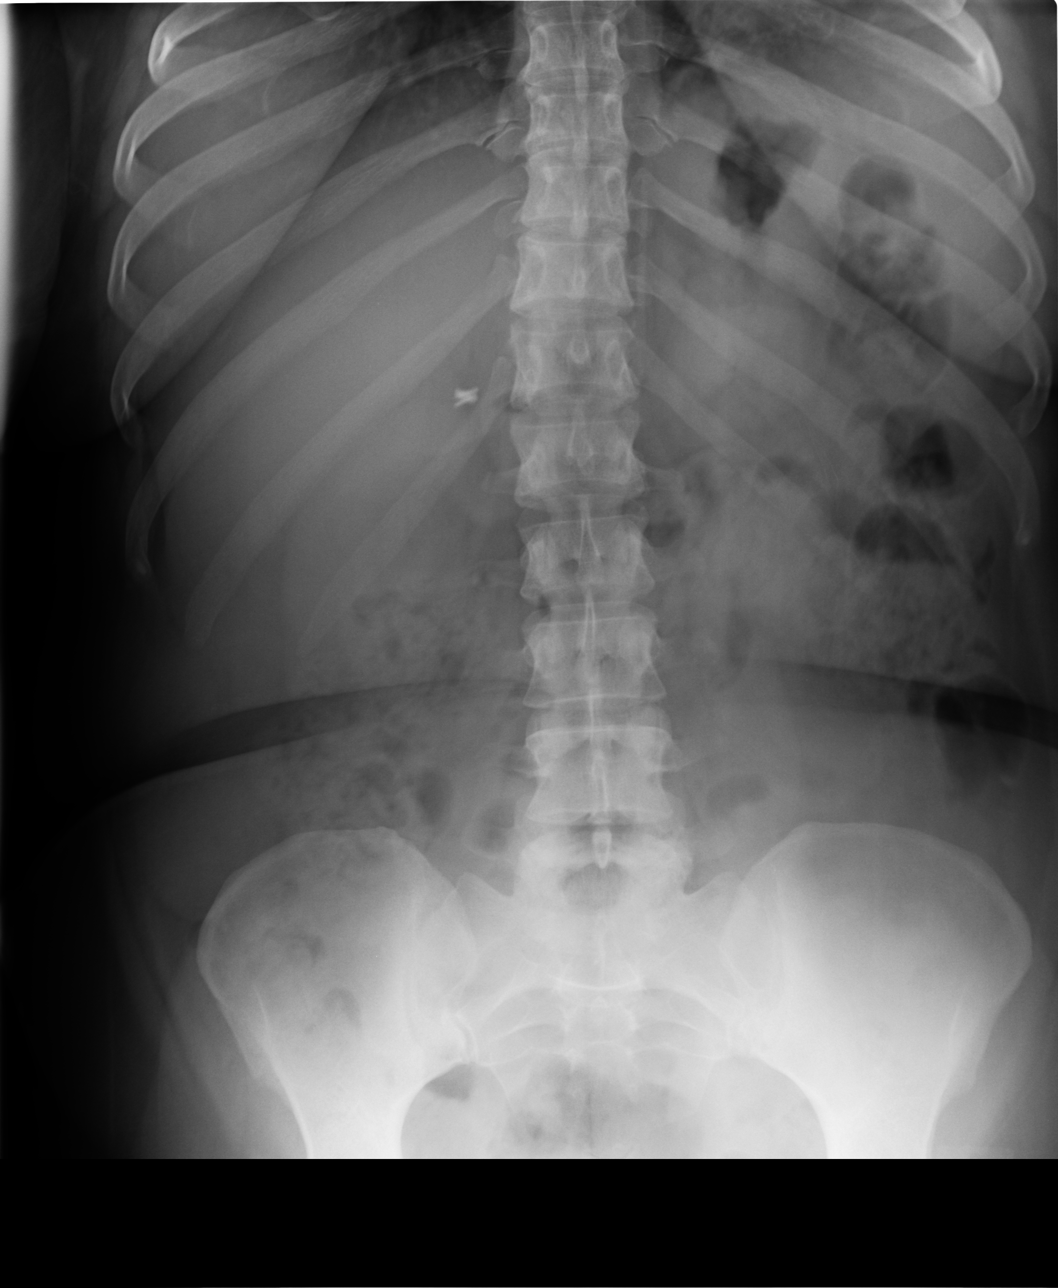
[im 3/4]
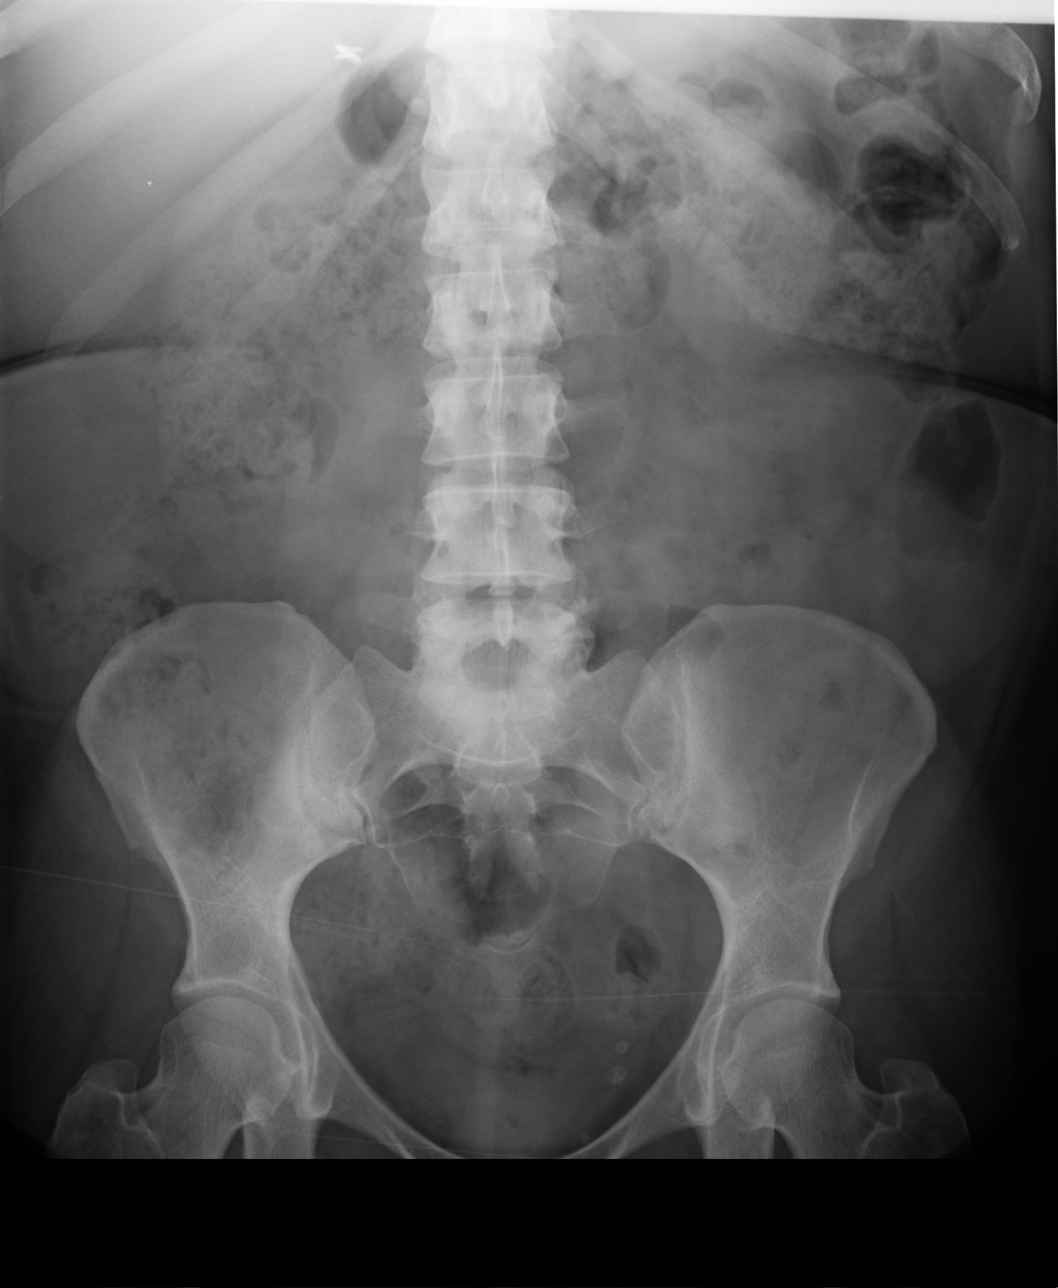
[im 4/4]
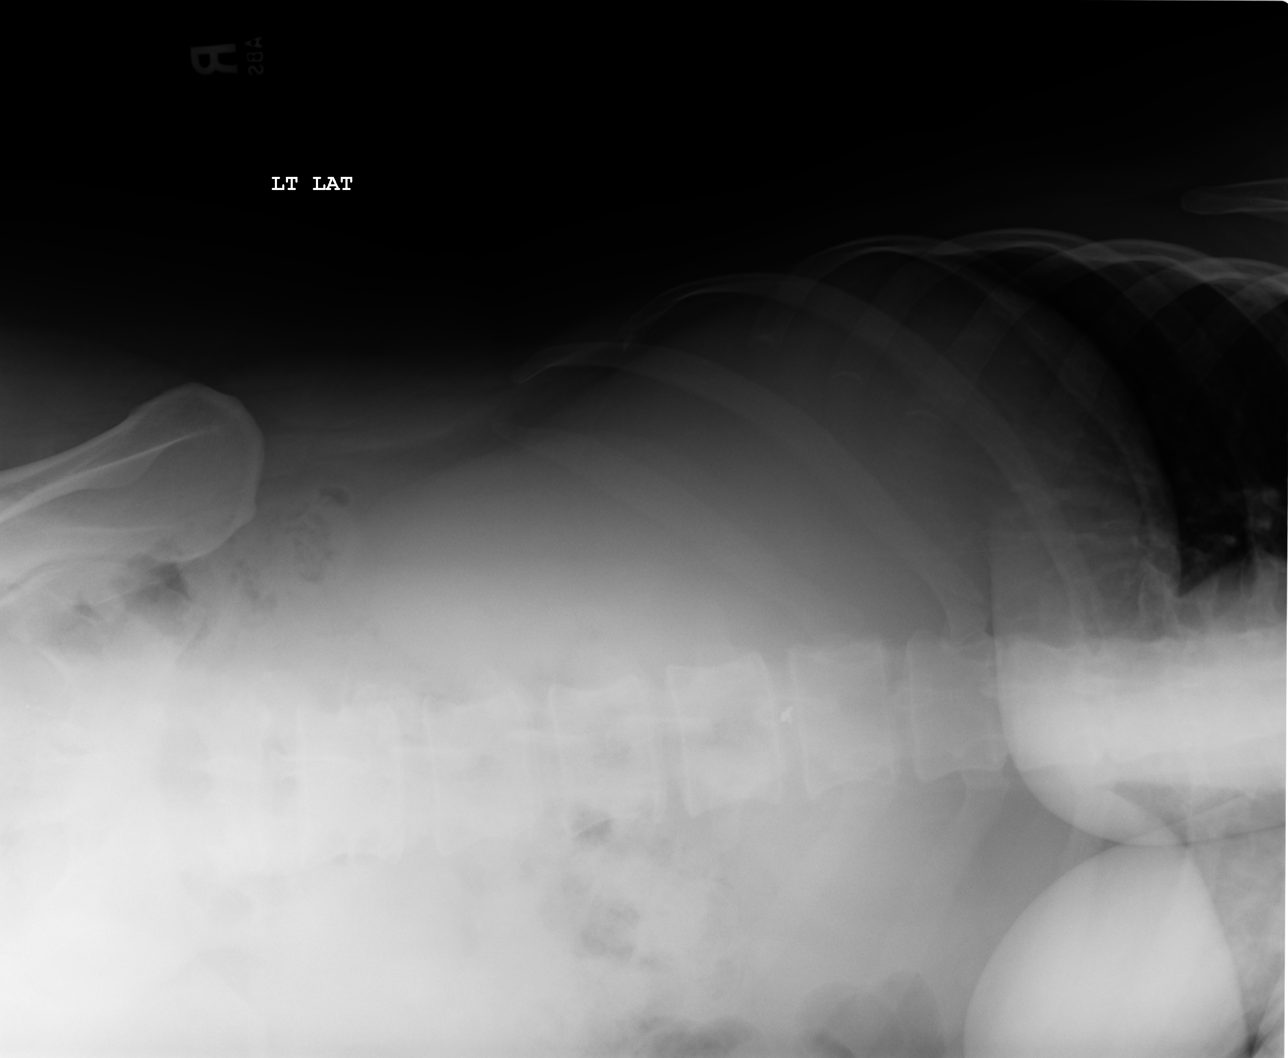

[4 of 4 positions shown; findings below may reference images not displayed]

PROCEDURE:     DXR - DXR ABDOMEN 3-WAY (INCL PA CXR)  - October 18, 2008 [DATE]

RESULT:     The lungs are mildly hypoinflated but clear. The cardiac
silhouette is top normal in size. Three views of the abdomen reveal a
moderate amount of stool throughout the colon. There is no evidence of ileus
or obstruction. There may be an element of constipation present. There are
surgical clips in the gallbladder fossa. I see no acute bony abnormality.
IMPRESSION: 1. The bowel gas pattern suggests constipation.
2. I do not see evidence of acute cardiopulmonary abnormality.

## 2010-05-22 IMAGING — CT CT ABD-PELV W/ CM
1 of 2 series · 14 of 32 positions shown, 18 images · non-contrast
Comparison: none

REASON FOR EXAM: (1) abd pain - epigastric -; (2) abd pain - epigastric -
COMMENTS:

[Series 2: appendicitis · axial · 0.78mm/px · z∈[-480,-22]mm · 14 of 167 slices shown, 18 images]
[im 7/167  soft-tissue]
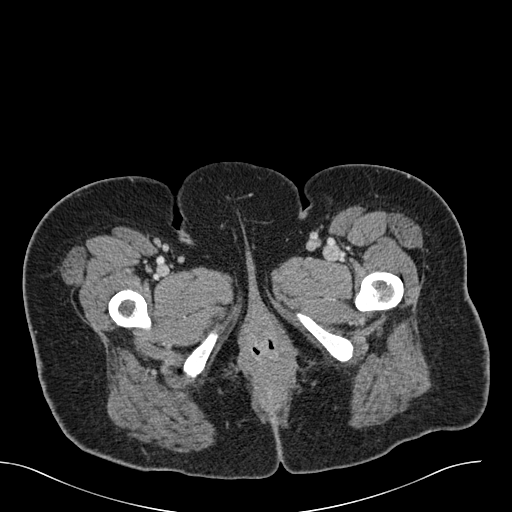
[im 7/167  bone]
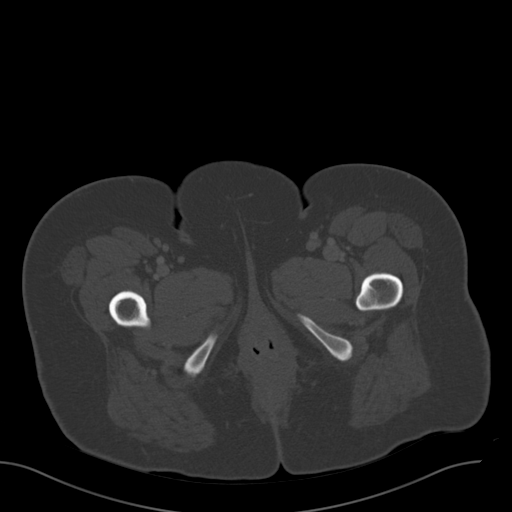
[im 21/167  soft-tissue]
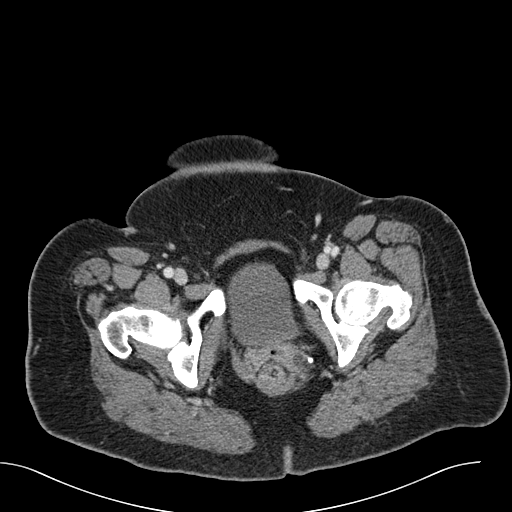
[im 35/167  soft-tissue]
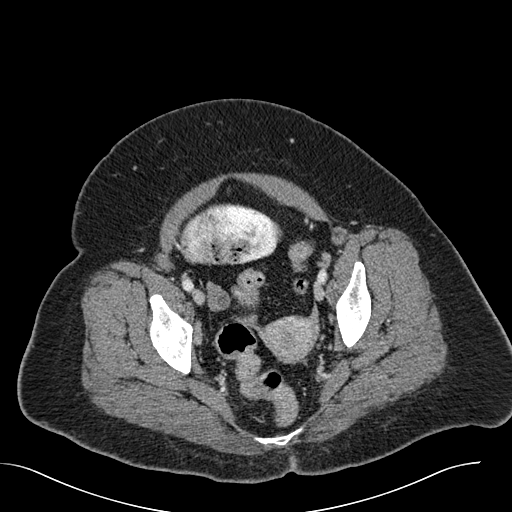
[im 49/167  soft-tissue]
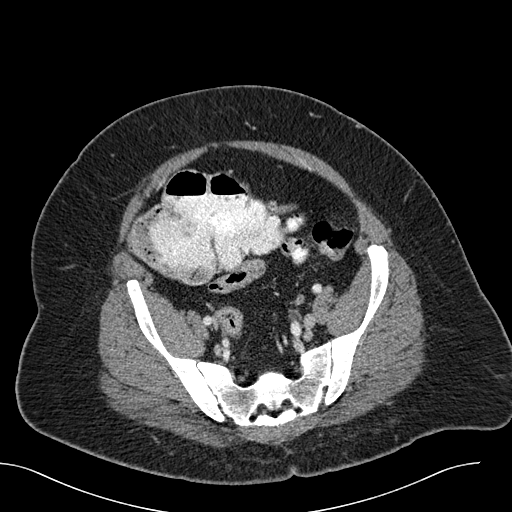
[im 63/167  soft-tissue]
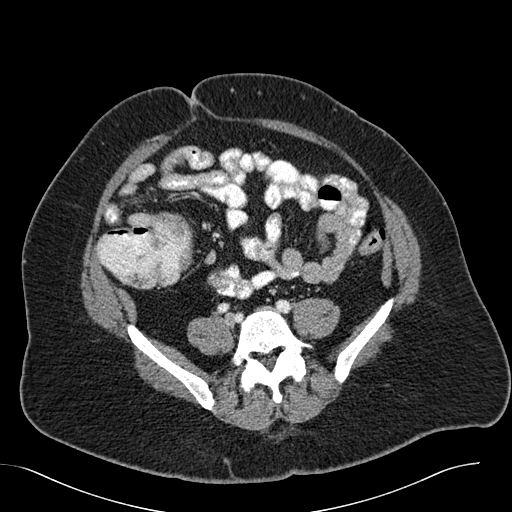
[im 77/167  soft-tissue]
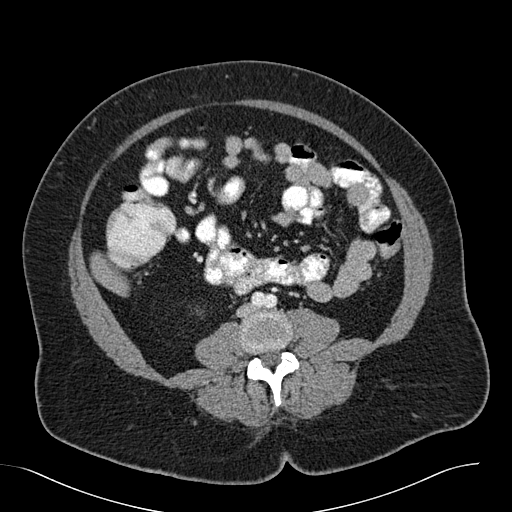
[im 90/167  soft-tissue]
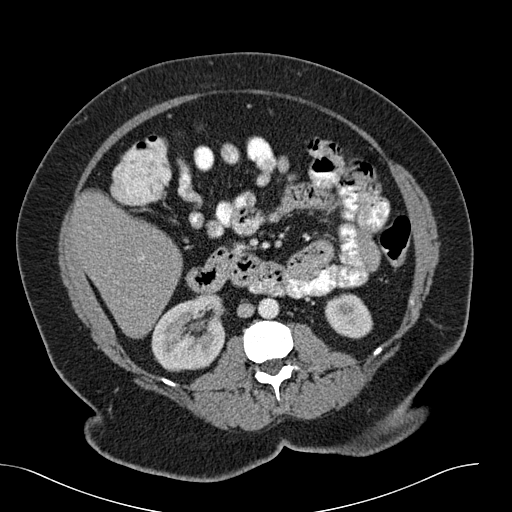
[im 104/167  soft-tissue]
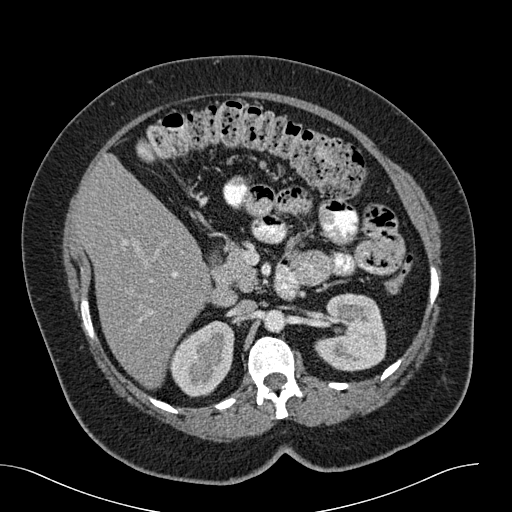
[im 118/167  soft-tissue]
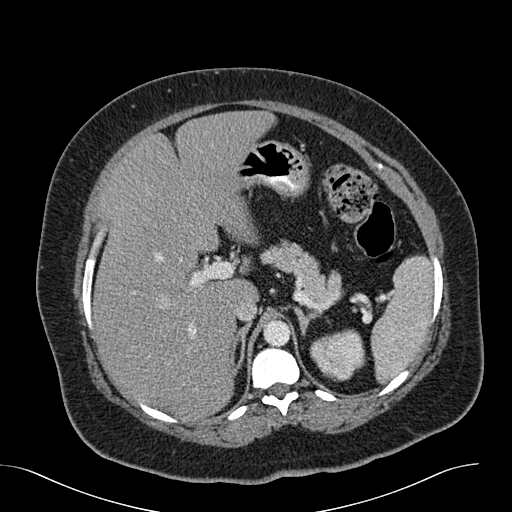
[im 118/167  bone]
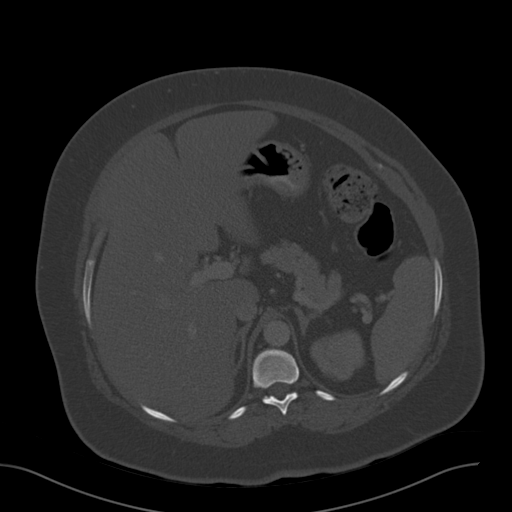
[im 132/167  soft-tissue]
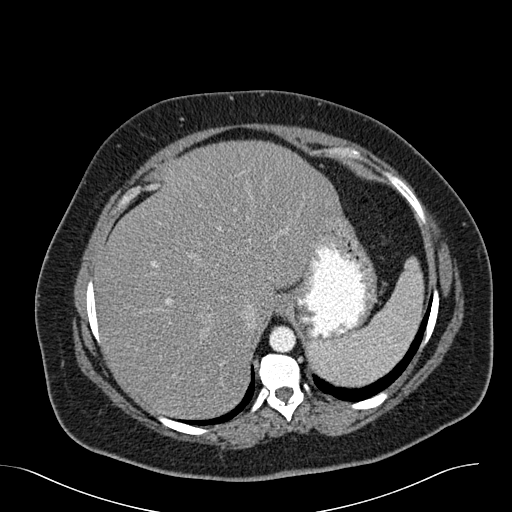
[im 139/167  lung]
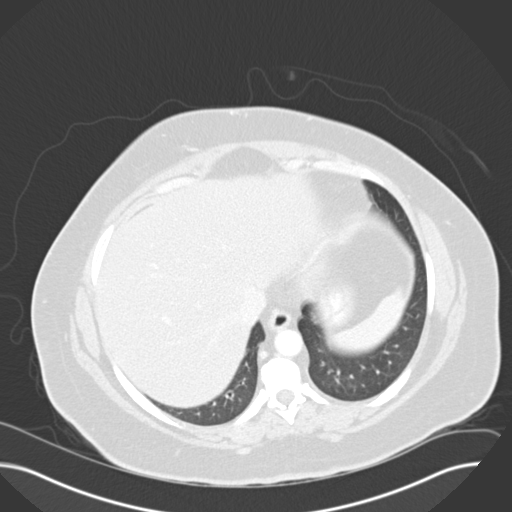
[im 146/167  soft-tissue]
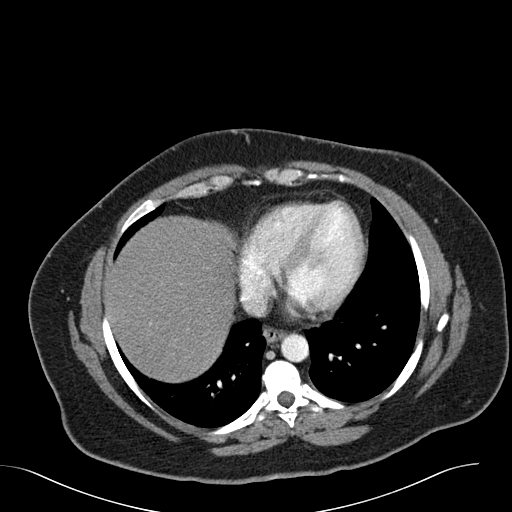
[im 146/167  lung]
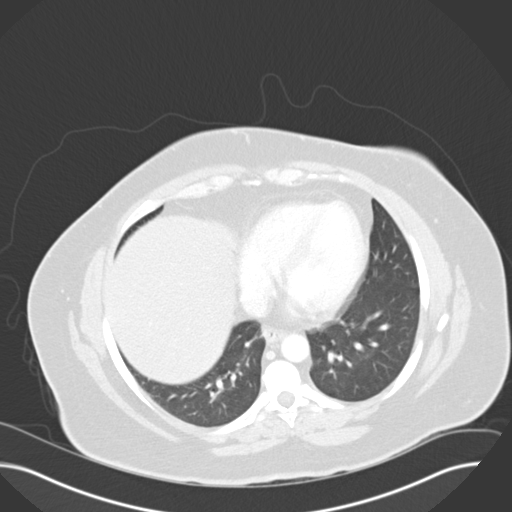
[im 153/167  lung]
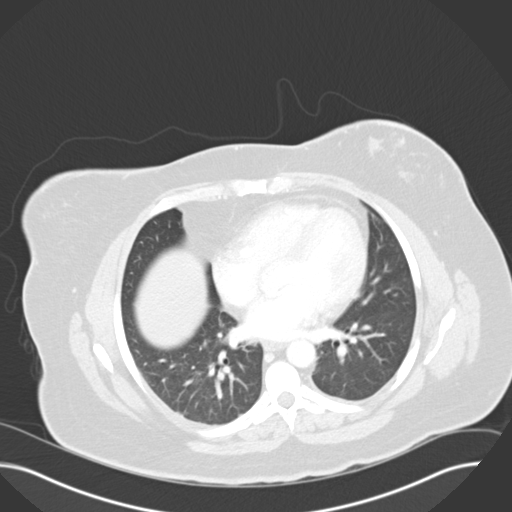
[im 160/167  soft-tissue]
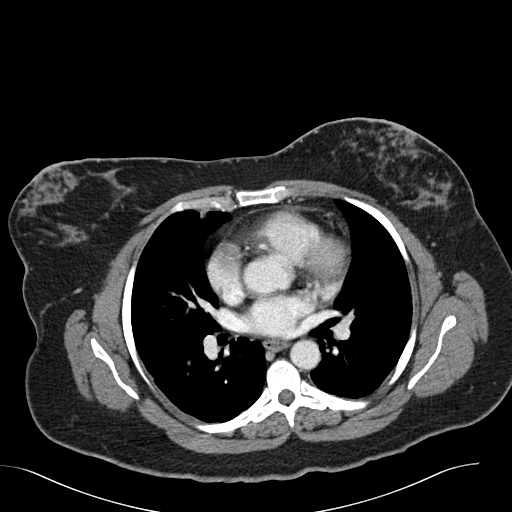
[im 160/167  lung]
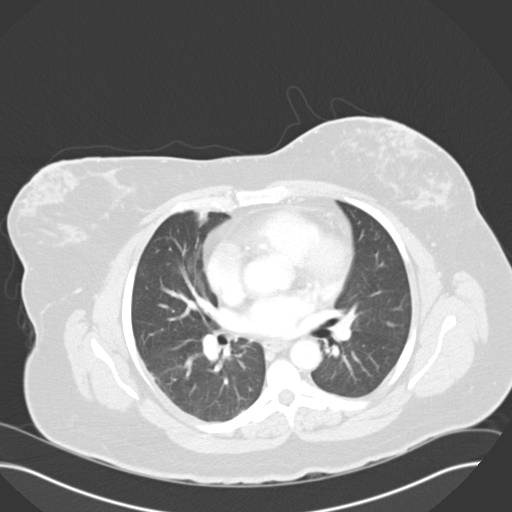

[14 of 32 positions shown; findings below may reference images not displayed]

PROCEDURE:     CT  - CT ABDOMEN / PELVIS  W  - October 18, 2008  [DATE]

RESULT:     The patient is experiencing epigastric discomfort and underwent
CT scanning of the abdomen and pelvis at 3 mm intervals and slice
thicknesses following administration of 100 cc of 6sovue-W1V as well as oral
contrast material. The patient has a history of prior cholecystectomy.
Comparison is made to study 26 March, 2008 and 21 May, 2007. Review of
3-dimensional reconstructed images was performed separately on the [employer]
Server monitor.

The liver exhibits normal density with no mass nor ductal dilation. The
gallbladder fossa is normal in appearance. The spleen, stomach, pancreas,
adrenal glands, and kidneys are normal in appearance. The caliber of the
abdominal aorta is normal.

The partially contrast-filled loops of small bowel are normal in appearance.
The cecum appears somewhat distended with stool and contrast and fluid.
There is a structure which may reflect an elongated edematous appendix vs a
loop of poorly distended small bowel seen on images 99 through 118 anterior
to the cecum. I do not see evidence of an abscess or free fluid or bowel
obstruction. There are a number of normal size to minimally enlarged
pericecal mesenteric lymph nodes demonstrated.

The transverse colon is moderately distended with stool. The descending
colon is relatively collapsed. The sigmoid colon and rectum exhibit no acute
abnormality. The uterus is normal in size. There is no adnexal mass. The
ovaries appear normal. I see no inguinal hernia. There is a small umbilical
hernia containing fat. The lumbar vertebral bodies are preserved in height.
The lung bases are clear.
IMPRESSION: 1. The appendix is not discretely identified. Structures described above
could reflect loops of small bowel. Review of the to prior studies does not
reveal definite appendix either. The cecum on the prior studies has been
seen to be mildly distended as seen today. I do not see objective evidence
of acute diverticulitis nor evidence of acute bowel obstruction.
2. The gallbladder is surgically absent. I do not see evidence of acute
hepatobiliary abnormality.
3. No acute urinary tract abnormality is suspected.

Correlation with patient's clinical and laboratory values is needed. If the
patient is exhibiting tenderness in the right lower quadrant, the
possibility of appendicitis must be considered.

## 2010-05-25 ENCOUNTER — Telehealth: Payer: Self-pay | Admitting: Family Medicine

## 2010-06-09 ENCOUNTER — Ambulatory Visit: Payer: Self-pay | Admitting: Unknown Physician Specialty

## 2010-06-15 ENCOUNTER — Ambulatory Visit: Payer: Self-pay | Admitting: Unknown Physician Specialty

## 2010-06-22 ENCOUNTER — Ambulatory Visit: Payer: Self-pay | Admitting: Unknown Physician Specialty

## 2010-07-02 IMAGING — CR DG CHEST 2V
1 series · 2 of 2 positions shown · non-contrast
Comparison: none

REASON FOR EXAM: cp
COMMENTS:

[Series 1: view not recorded · 0.17mm/px · 2 of 2 slices shown]
[im 1/2]
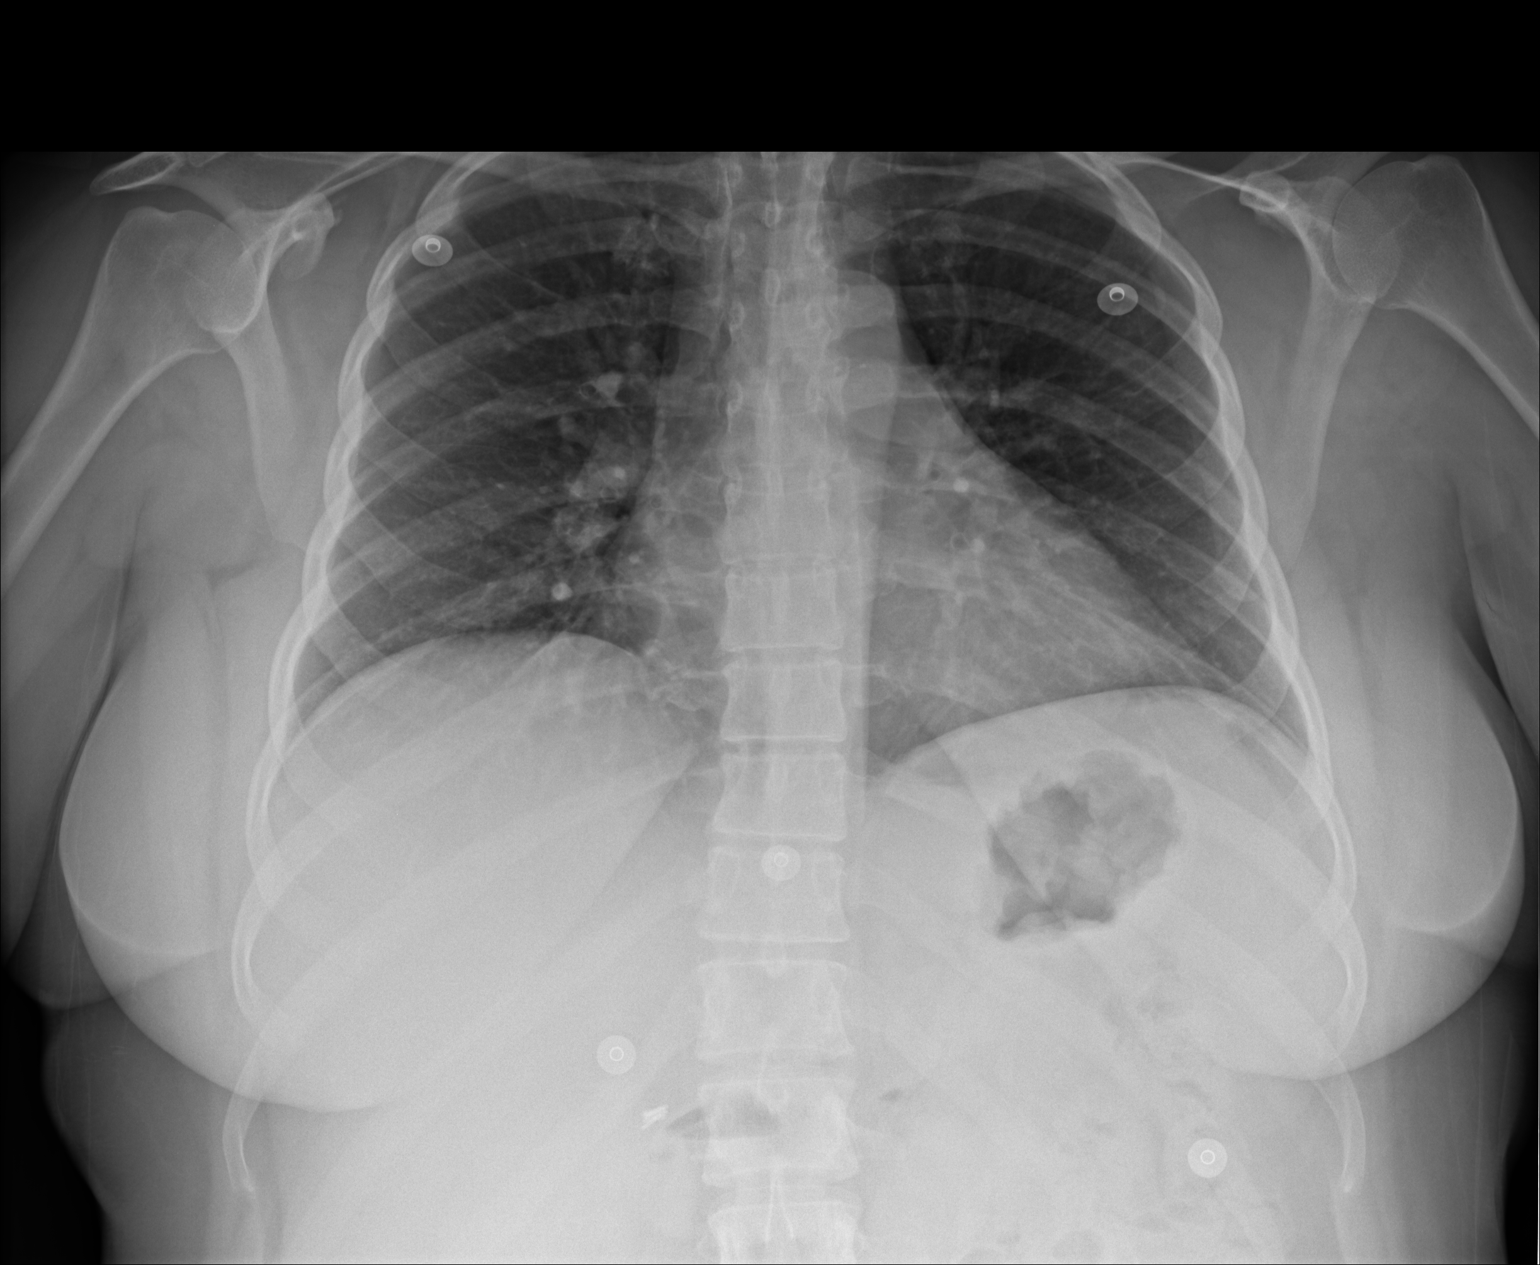
[im 2/2]
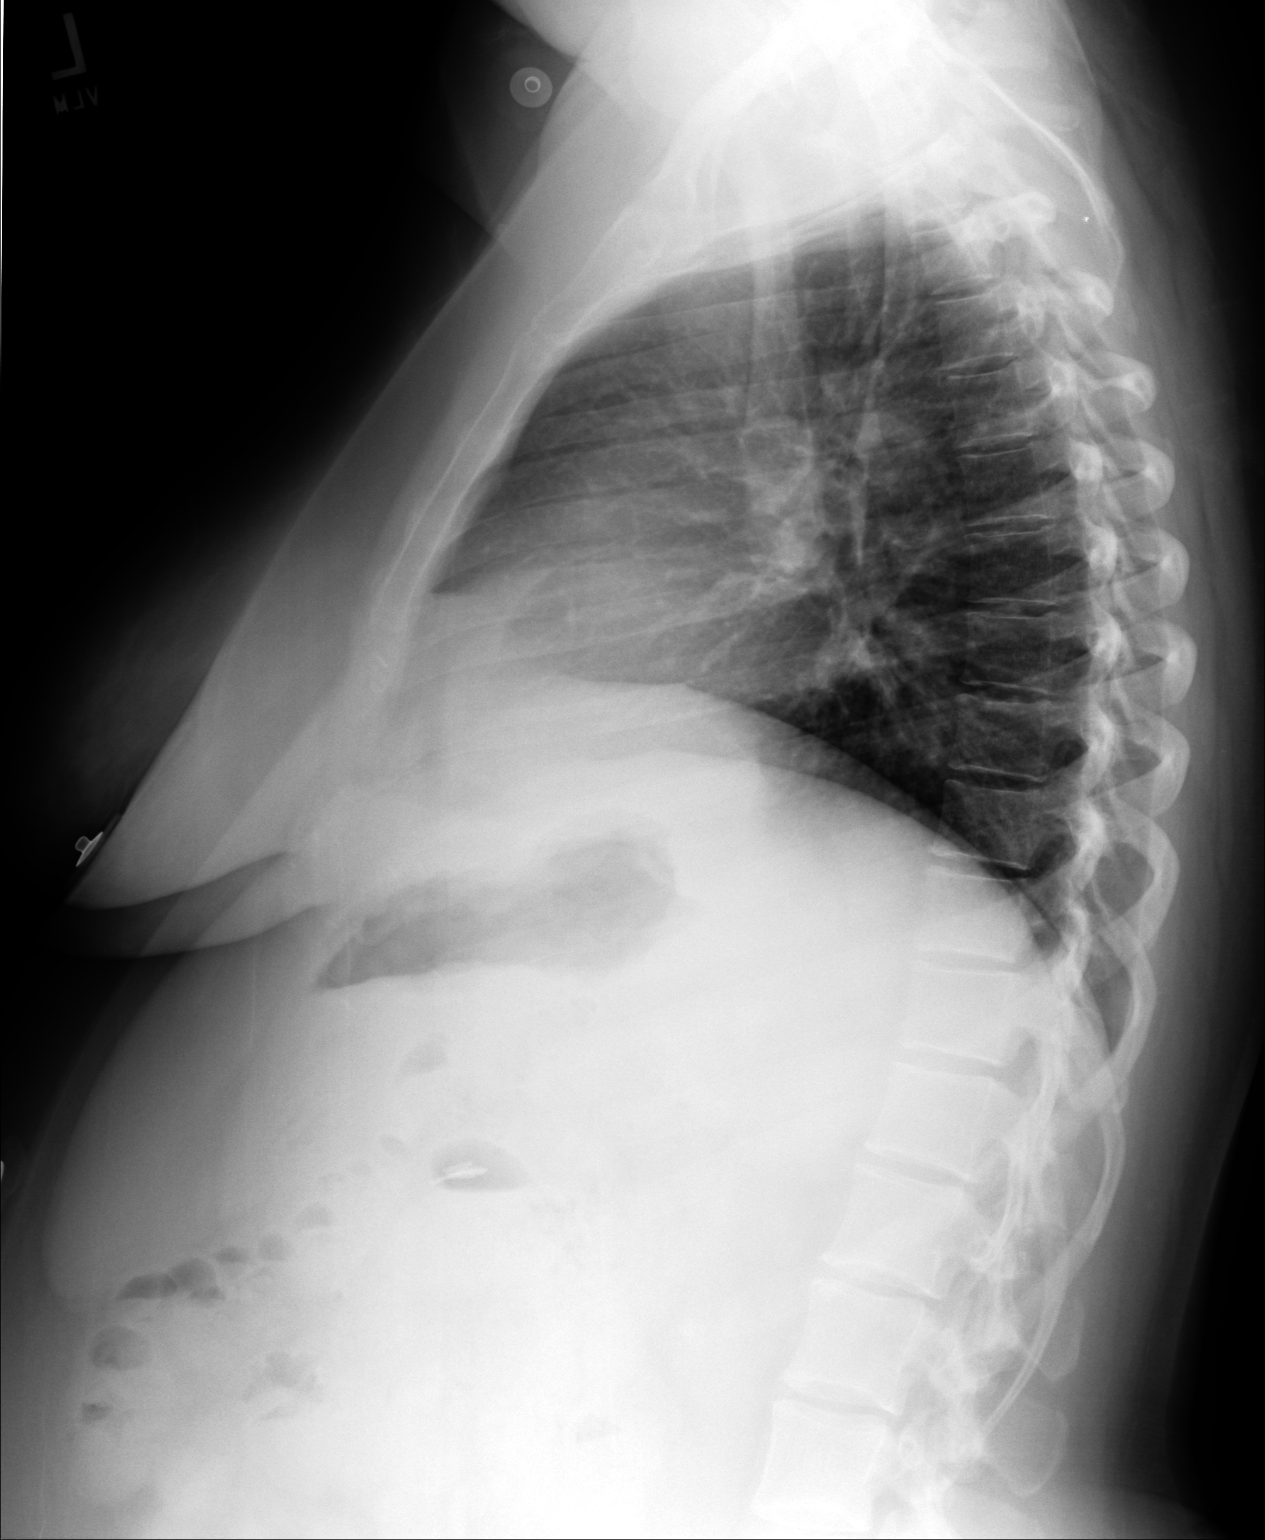

[2 of 2 positions shown; findings below may reference images not displayed]

PROCEDURE:     DXR - DXR CHEST PA (OR AP) AND LATERAL  - November 28, 2008  [DATE]

RESULT:     Comparison is made to a study 07 September, 2007.

The lungs are mildly hypoinflated. There is no focal infiltrate. There is no
evidence of a pleural effusion. The cardiac silhouette is mildly enlarged
though stable. The central pulmonary vascularity is prominent but also
stable.
IMPRESSION: I do not see evidence of pneumonia. Mildly increased
interstitial markings in both lungs are present predominantly in the
perihilar region. The possibility of there being an element of low-grade CHF
is raised. The interstitial markings are more prominent today than on the
prior study. Followup films following therapy would be of value.

## 2010-08-10 IMAGING — CR DG CHEST 1V PORT
1 series · 1 of 1 positions shown · non-contrast
Comparison: none

REASON FOR EXAM: ams
COMMENTS:

PROCEDURE:     DXR - DXR PORTABLE CHEST SINGLE VIEW  - January 06, 2009 [DATE]
RESULT:     The lung fields are clear. No pneumonia, pneumothorax or pleural
effusion is seen. Compared to a prior exam of 11/28/2008, no significant
interval changes are seen.

[view not recorded]
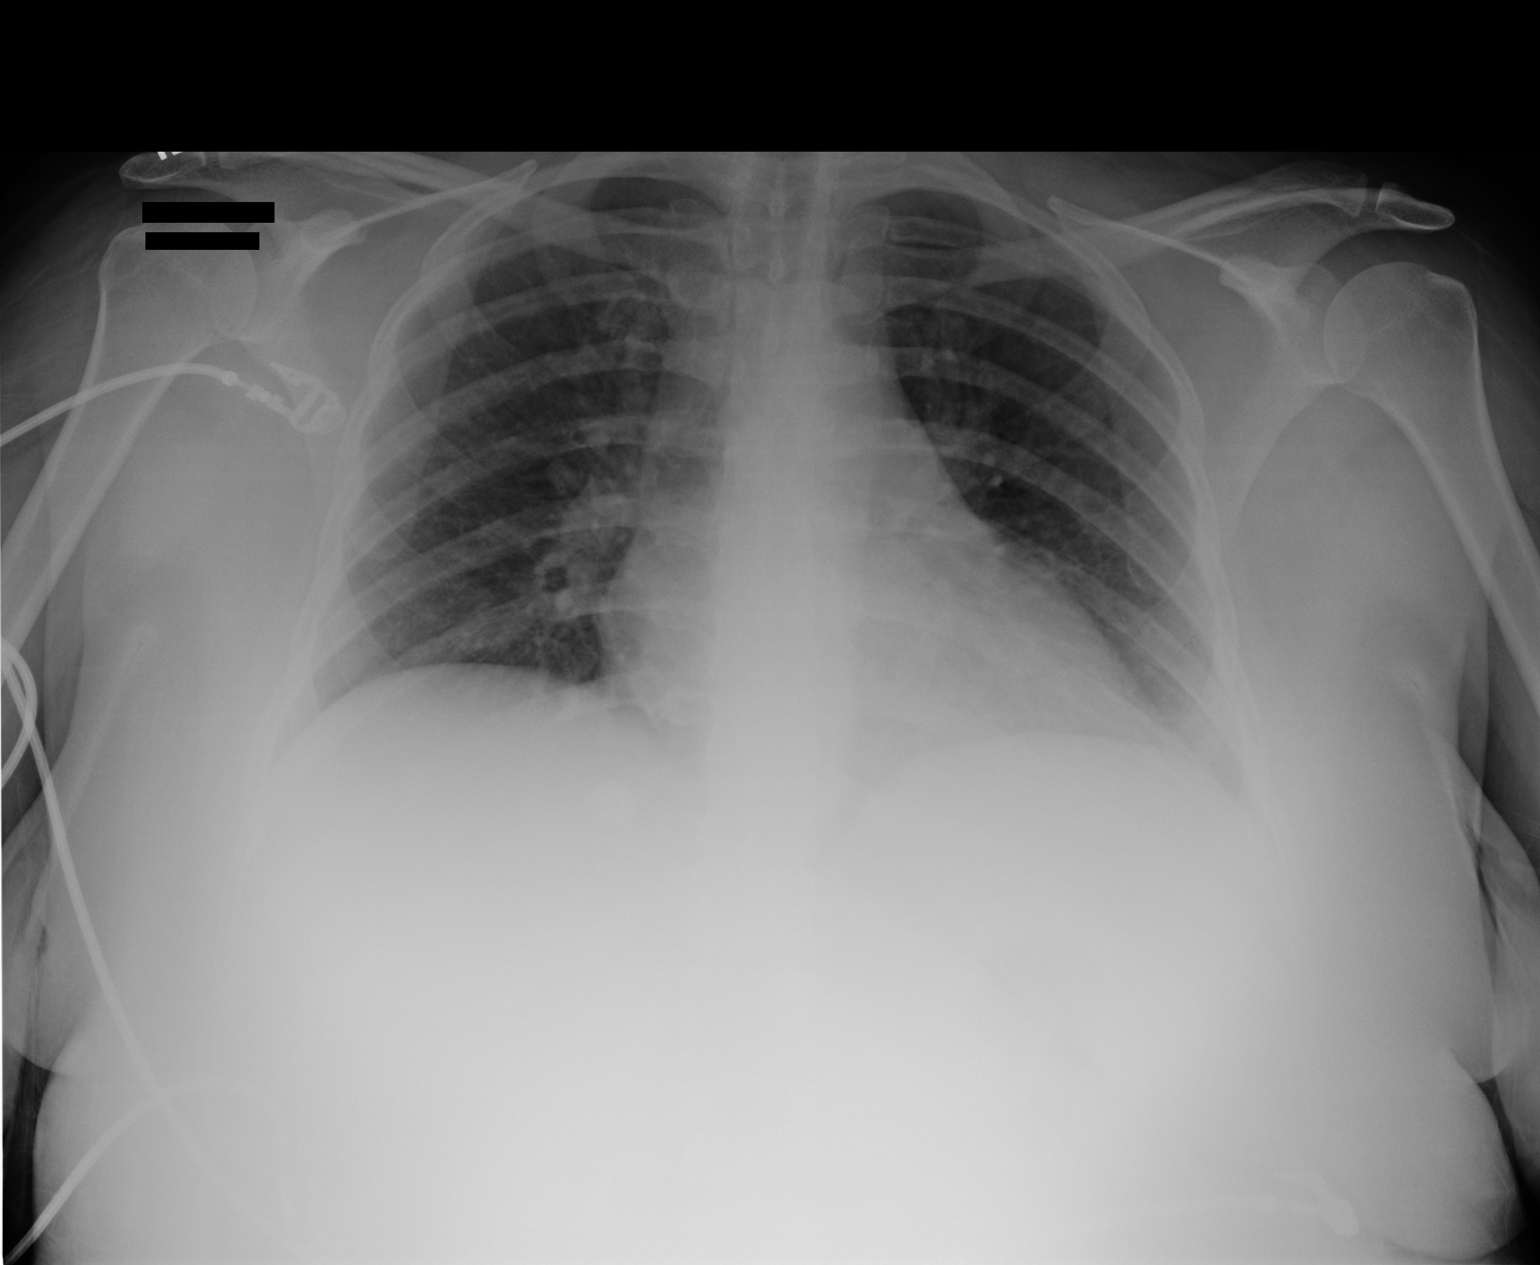

[1 of 1 positions shown; findings below may reference images not displayed]

IMPRESSION: No significant abnormalities are noted.

## 2010-08-10 IMAGING — CT CT HEAD WITHOUT CONTRAST
2 series · 16 of 30 positions shown, 20 images · non-contrast
Comparison: none

REASON FOR EXAM: ams
COMMENTS:

PROCEDURE:     CT  - CT HEAD WITHOUT CONTRAST  - January 06, 2009  [DATE]
RESULT:     Comparison: 12/12/2007, 09/09/2006, 04/06/2006
TECHNIQUE: Multiple axial images from the foramen magnum to the vertex were
obtained without IV contrast.

[Series 4: without · axial · non-contrast · 0.39mm/px · z∈[+688,+808]mm · 13 of 30 slices shown, 17 images]
[im 3/30  brain]
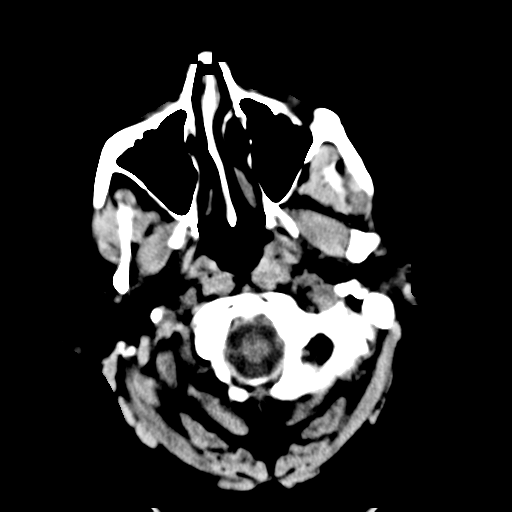
[im 3/30  bone]
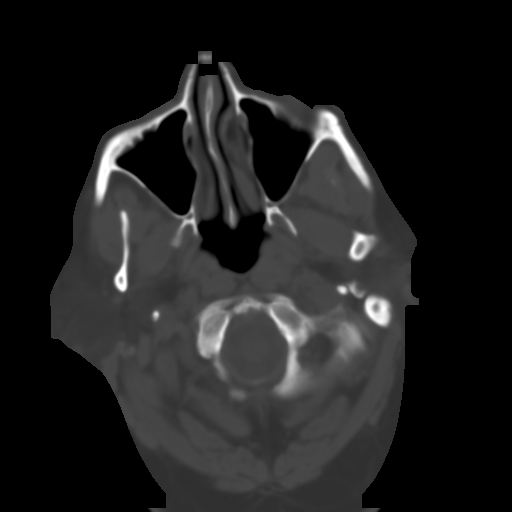
[im 5/30  brain]
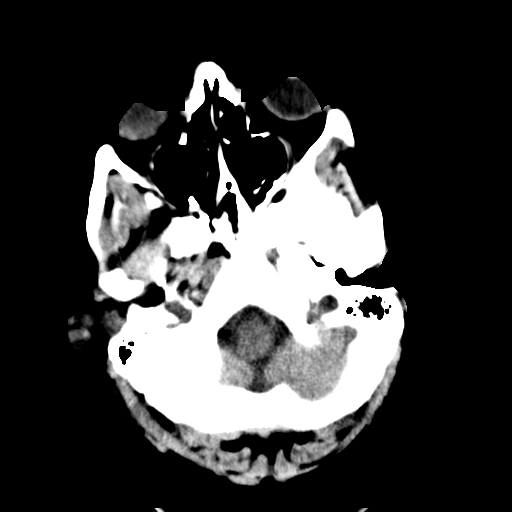
[im 7/30  brain]
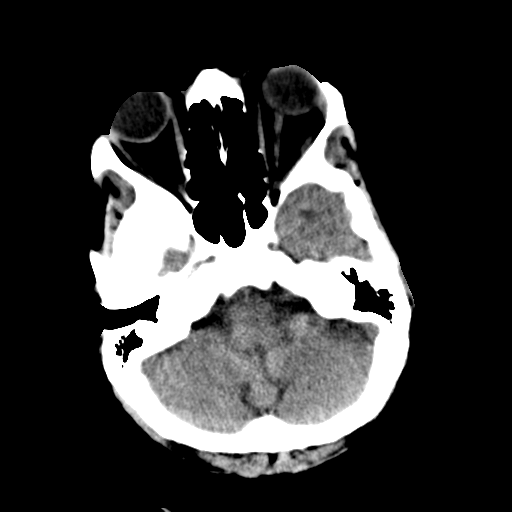
[im 9/30  brain]
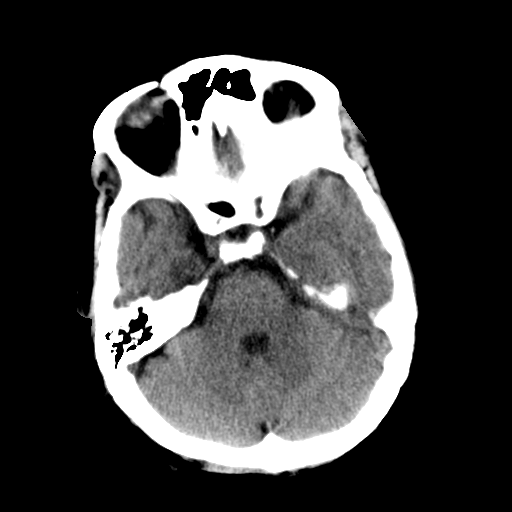
[im 11/30  brain]
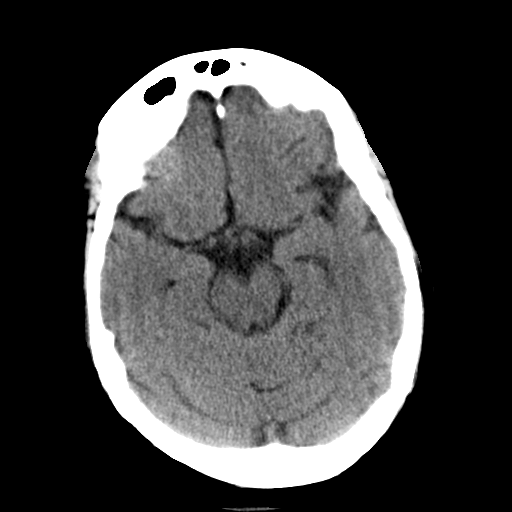
[im 11/30  bone]
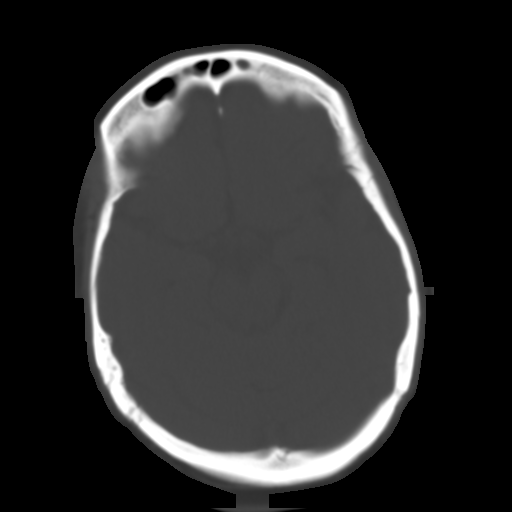
[im 13/30  brain]
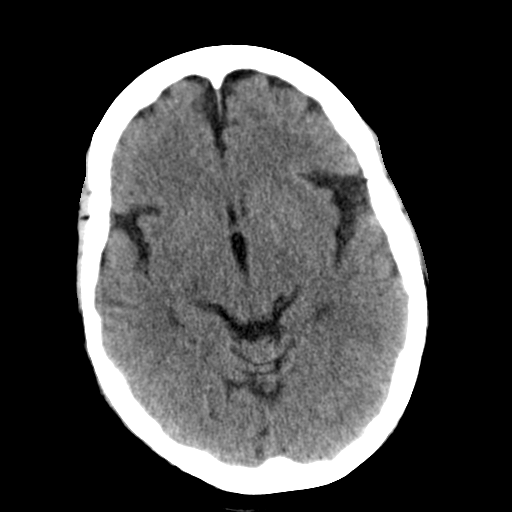
[im 15/30  brain]
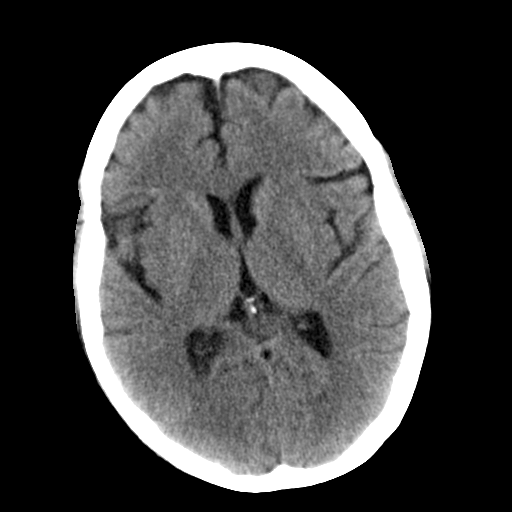
[im 17/30  brain]
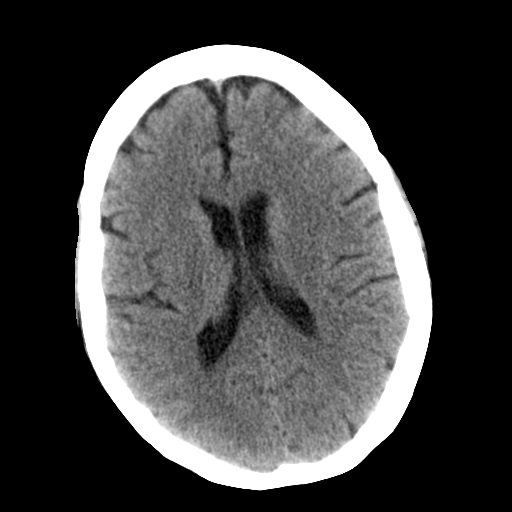
[im 19/30  brain]
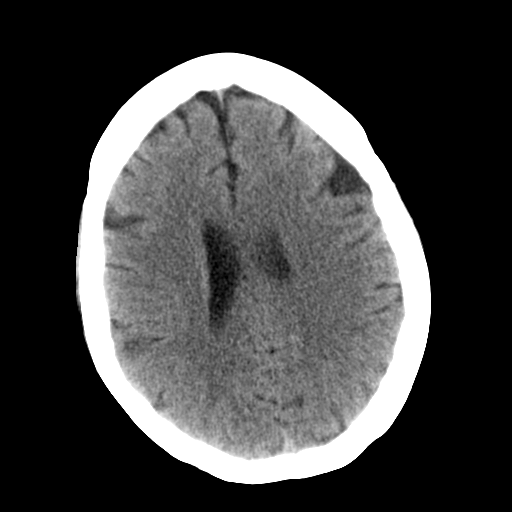
[im 19/30  bone]
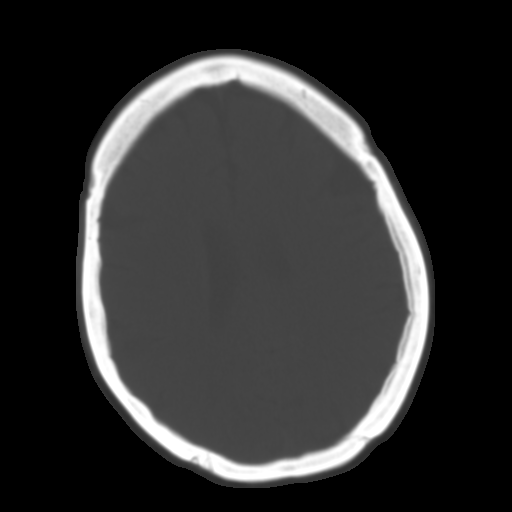
[im 21/30  brain]
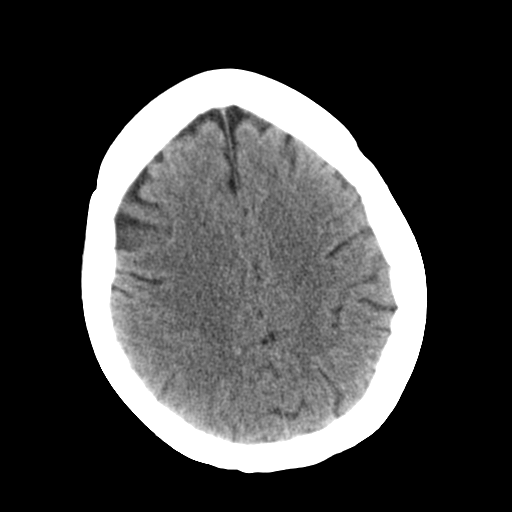
[im 23/30  brain]
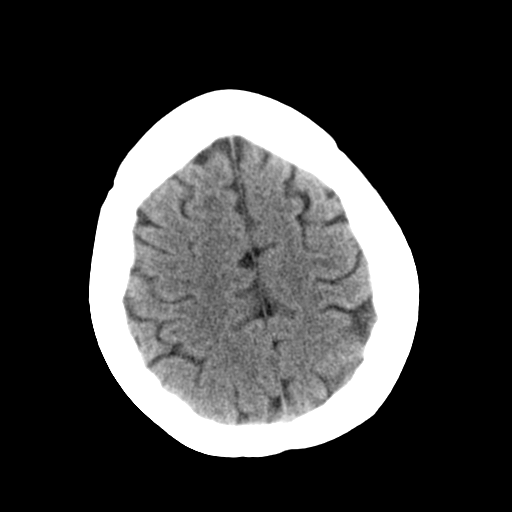
[im 25/30  brain]
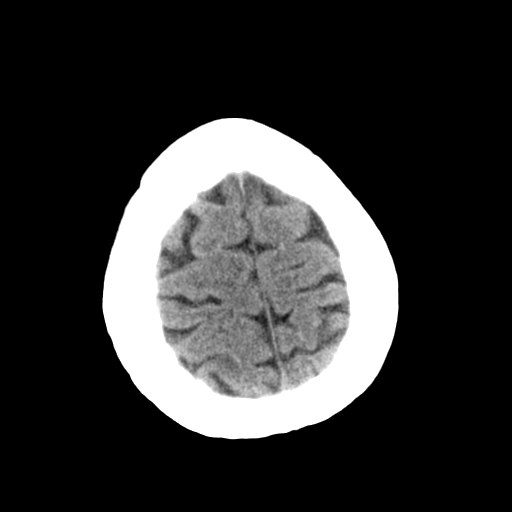
[im 27/30  brain]
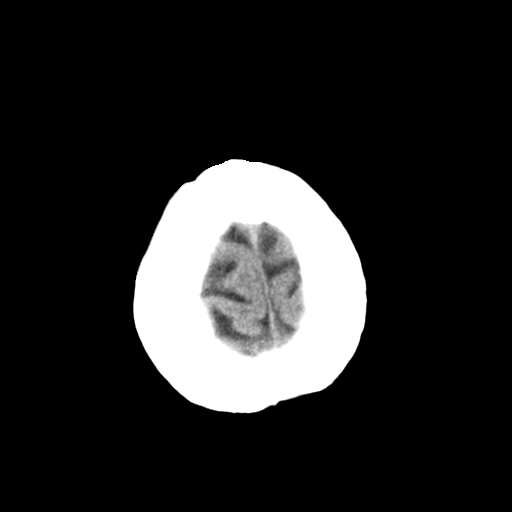
[im 27/30  bone]
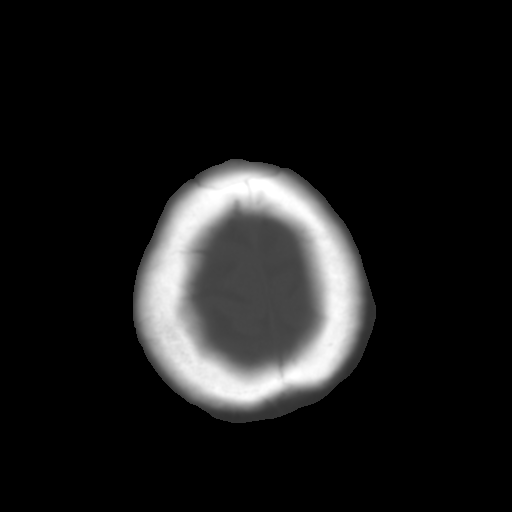

[Series 5: bone · axial · 0.39mm/px · z∈[+688,+728]mm · 3 of 30 slices shown]
[im 3/30  bone]
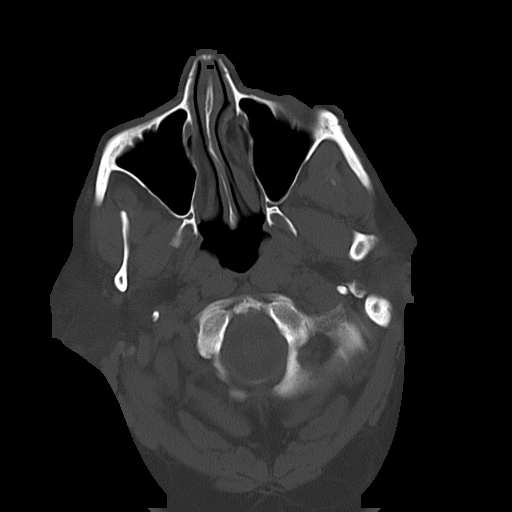
[im 7/30  bone]
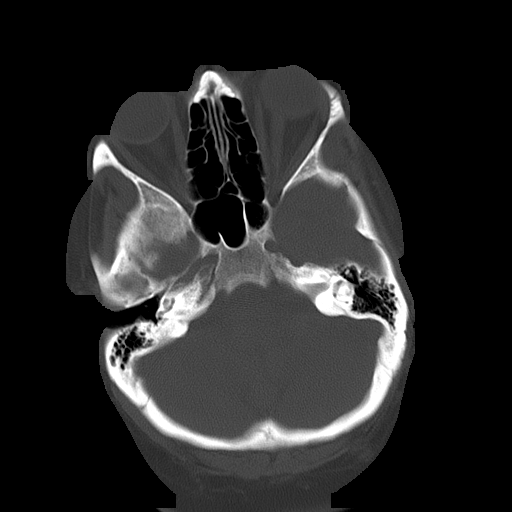
[im 11/30  bone]
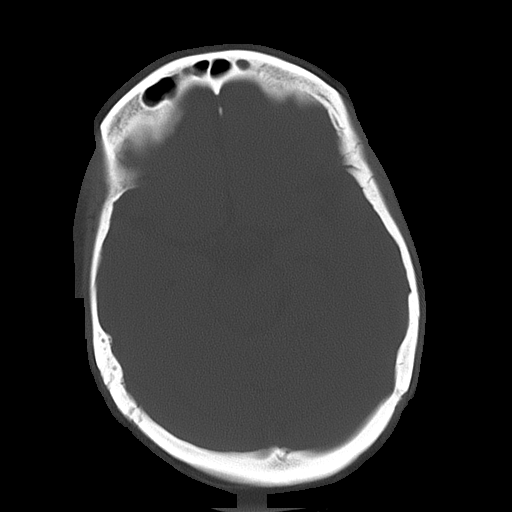

[16 of 30 positions shown; findings below may reference images not displayed]

FINDINGS: There is no evidence of mass effect, midline shift, or extra-axial fluid
collections.  There is no evidence of a space-occupying lesion or
intracranial hemorrhage. There is no evidence of a cortical-based area of
acute infarction.

The ventricles and sulci are appropriate for the patient's age. The basal
cisterns are patent.

Visualized portions of the orbits are unremarkable. The paranasal sinuses
and mastoid air cells are unremarkable.

The osseous structures are unremarkable.
IMPRESSION: No acute intracranial process.

## 2010-08-11 IMAGING — CR DG ABDOMEN 1V
1 series · 1 of 1 positions shown · non-contrast
Comparison: none

REASON FOR EXAM: distended abd, minimal bowel sounds, vomiting
COMMENTS:

[view not recorded]
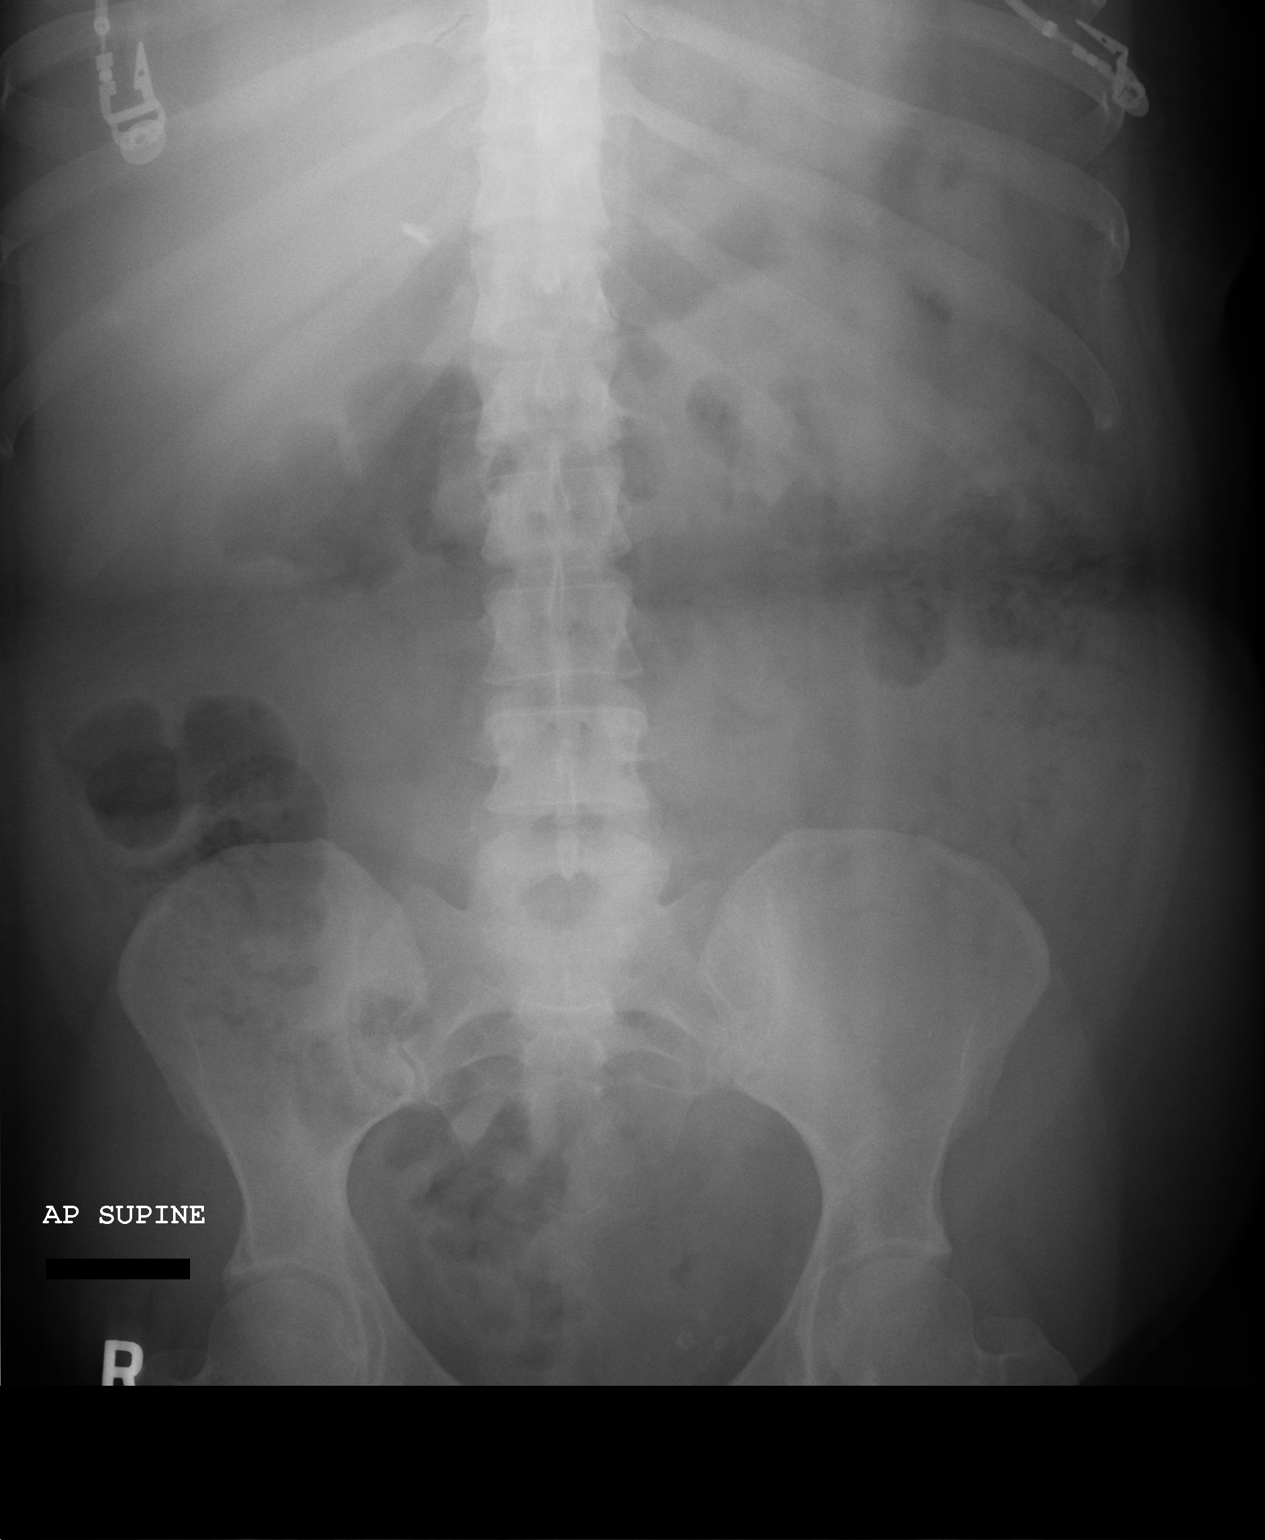

[1 of 1 positions shown; findings below may reference images not displayed]

PROCEDURE:     DXR - DXR KIDNEY URETER BLADDER  - January 07, 2009  [DATE]

RESULT:     An AP supine view of the abdomen was obtained. The bowel gas
pattern is normal. No dilated loops of bowel are seen. There is a normal
amount of fecal material observed in the colon. No abnormal intraabdominal
calcifications are seen. Postoperative metallic clips are noted in the
region of the gallbladder bed.
IMPRESSION: No acute changes are identified.

## 2010-08-11 IMAGING — US US RENAL KIDNEY
1 series · 17 of 23 positions shown · non-contrast
Comparison: none

REASON FOR EXAM: acute renal failure and oliguria
COMMENTS:

PROCEDURE:     US  - US KIDNEY  - January 07, 2009  [DATE]
RESULT:     Comparison: None
INDICATION: Acute renal failure

[Series 1: us renal kidney · 17 of 23 slices shown]
[im 1/23]
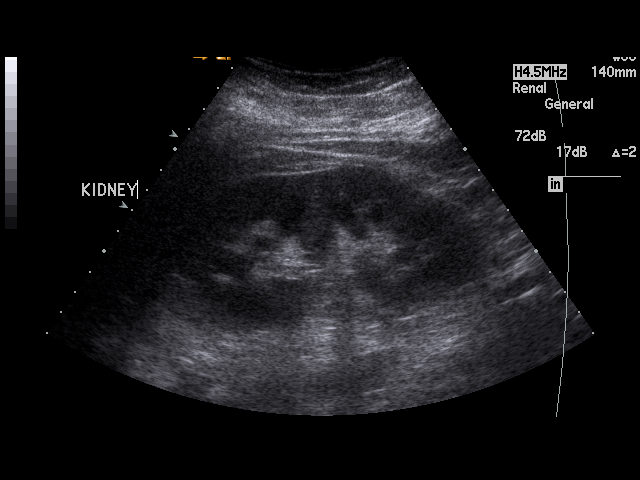
[im 3/23]
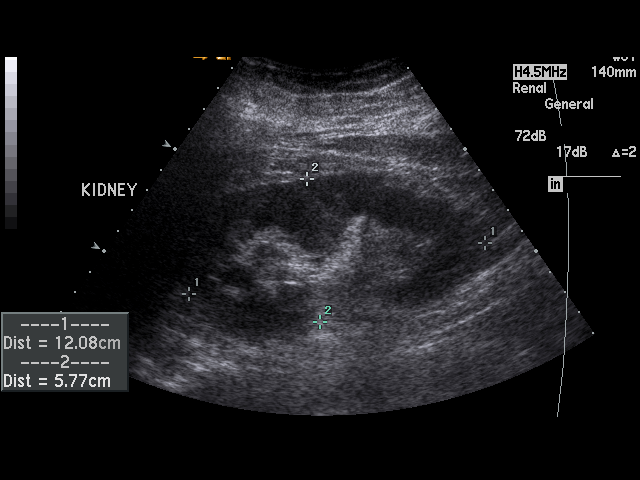
[im 4/23]
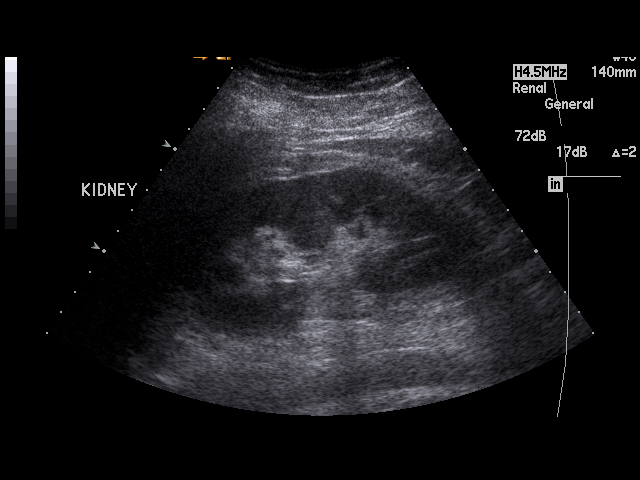
[im 5/23]
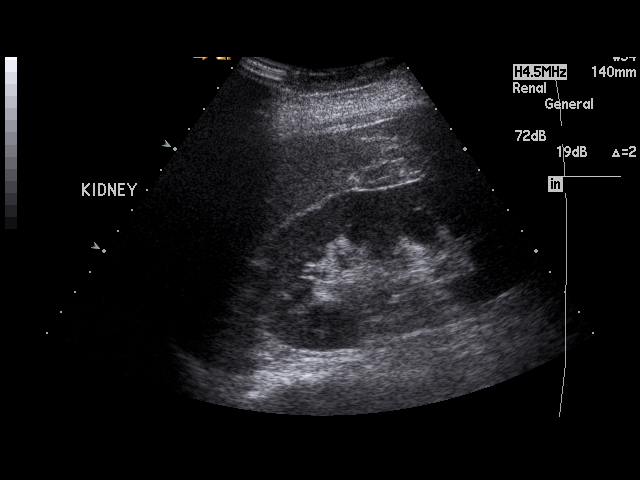
[im 7/23]
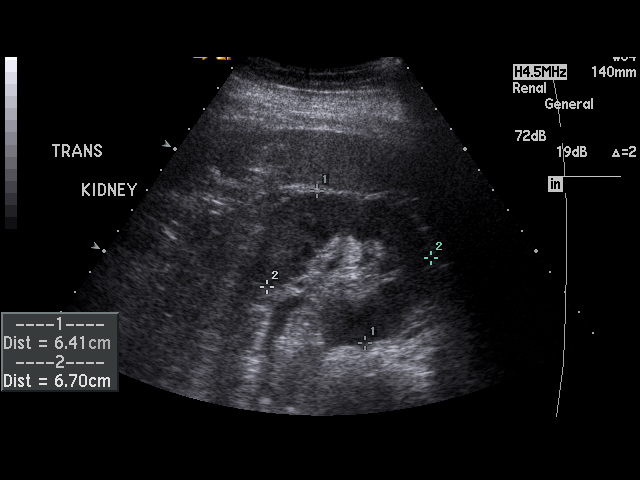
[im 8/23]
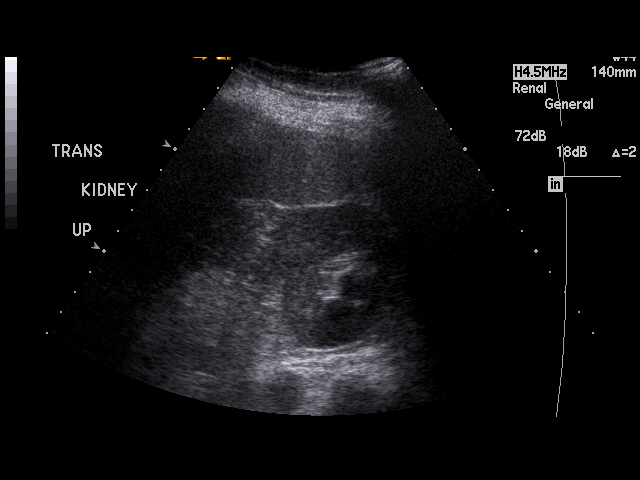
[im 9/23]
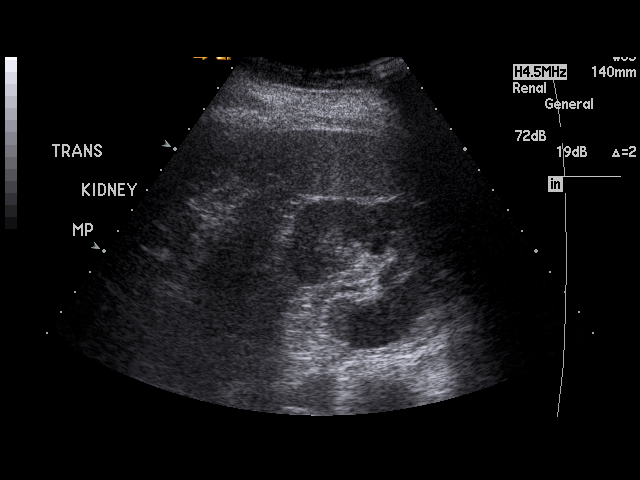
[im 11/23]
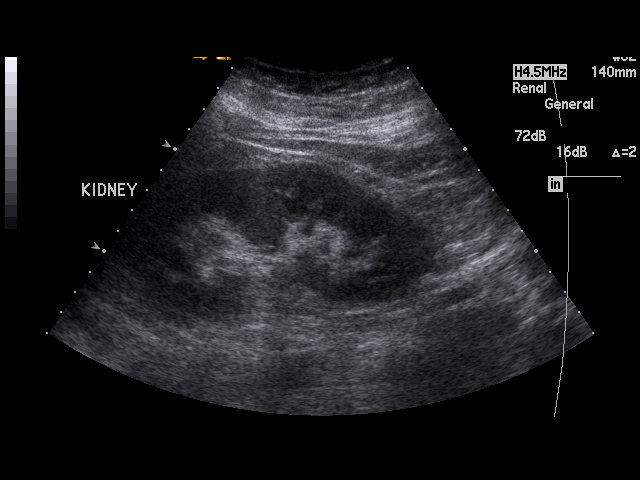
[im 12/23]
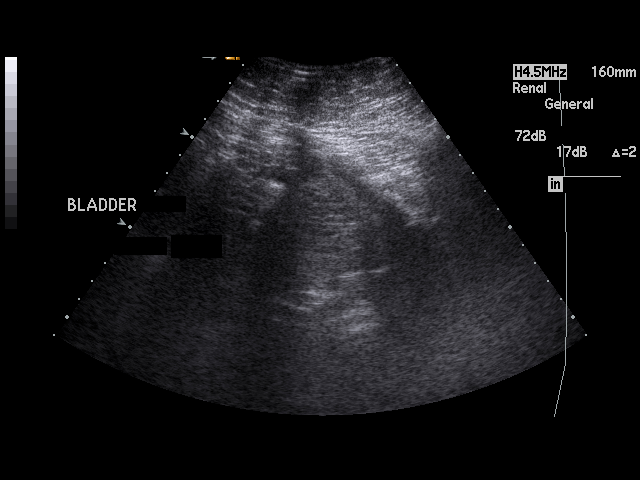
[im 13/23]
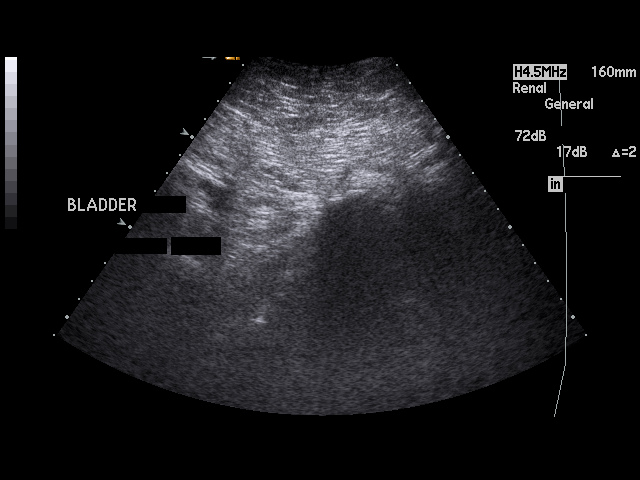
[im 15/23]
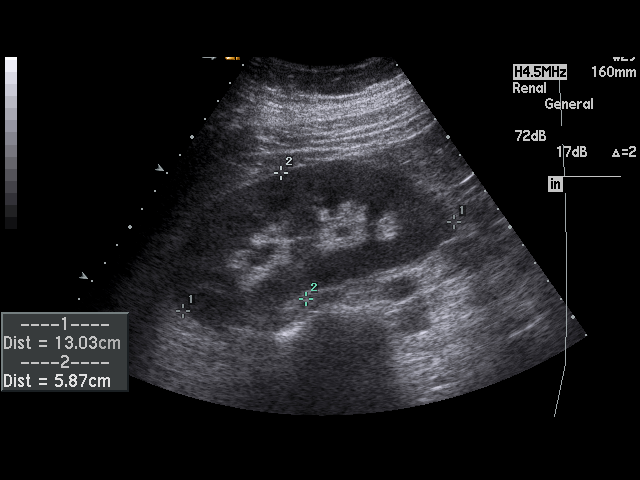
[im 16/23]
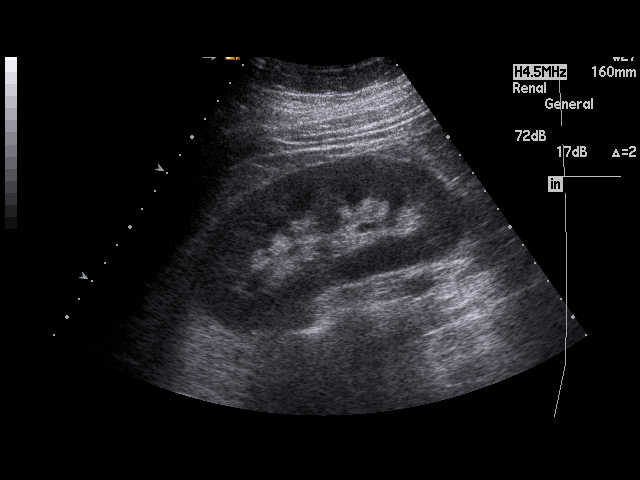
[im 17/23]
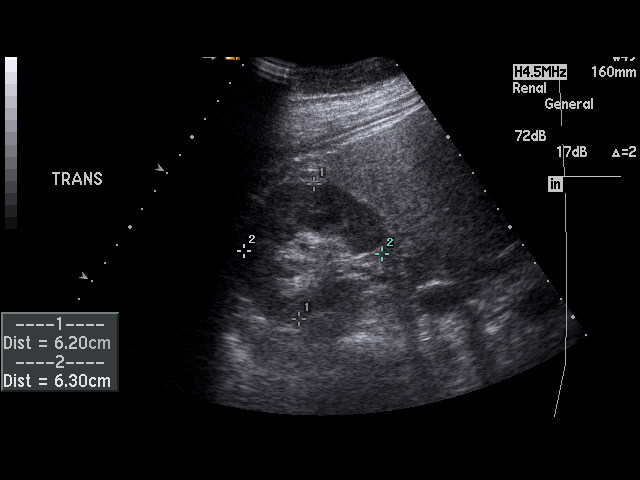
[im 19/23]
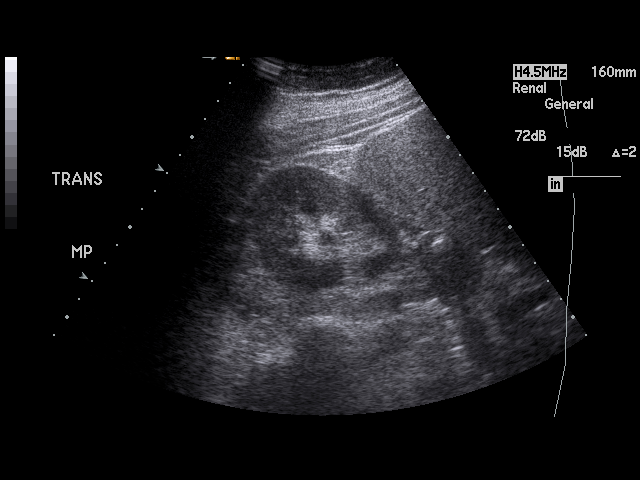
[im 20/23]
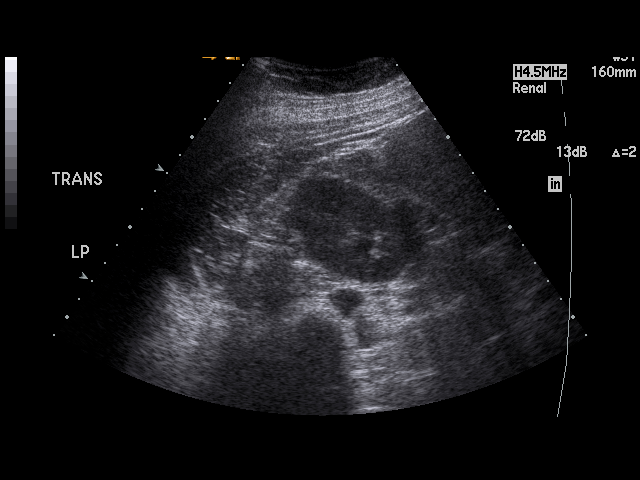
[im 21/23]
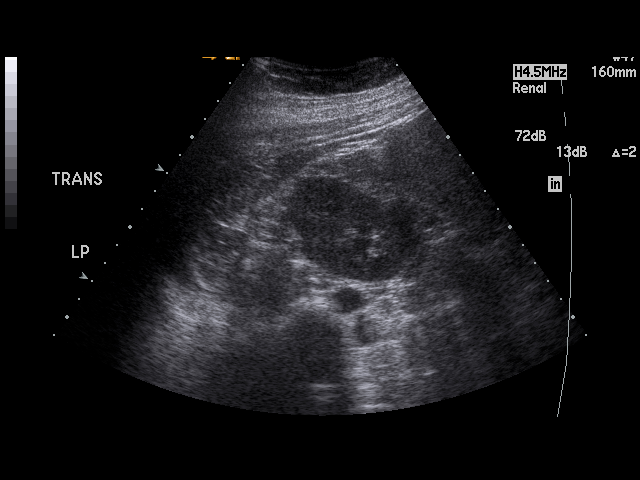
[im 23/23]
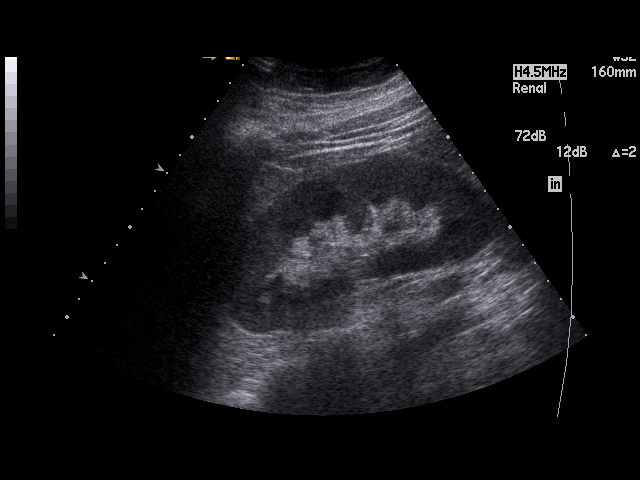

[17 of 23 positions shown; findings below may reference images not displayed]

Technique and findings: Multiple gray-scale and color doppler images of the
kidneys were obtained.

The right kidney measures 13 cm, the left kidney measures 12.1 cm. The
kidneys are normal in echogenicity. There is no hydronephrosis, echogenic
foci, or evident renal masses. No free fluid in the region of the renal
fossa.

The bladder is decompressed secondary to the presence of a Foley catheter.
IMPRESSION: Normal renal ultrasound.

## 2010-08-26 ENCOUNTER — Emergency Department: Payer: Self-pay | Admitting: Emergency Medicine

## 2010-09-29 ENCOUNTER — Emergency Department: Payer: Self-pay | Admitting: Internal Medicine

## 2010-10-02 LAB — CONVERTED CEMR LAB
ALT: 27 units/L (ref 0–35)
AST: 25 units/L (ref 0–37)
Albumin: 3.8 g/dL (ref 3.5–5.2)
BUN: 16 mg/dL (ref 6–23)
Basophils Relative: 0.7 % (ref 0.0–3.0)
CO2: 26 meq/L (ref 19–32)
Calcium: 9.3 mg/dL (ref 8.4–10.5)
Chloride: 104 meq/L (ref 96–112)
Creatinine, Ser: 0.9 mg/dL (ref 0.4–1.2)
Eosinophils Relative: 3.3 % (ref 0.0–5.0)
Glucose, Bld: 242 mg/dL — ABNORMAL HIGH (ref 70–99)
Hemoglobin: 12.3 g/dL (ref 12.0–15.0)
Hgb A1c MFr Bld: 7.8 % — ABNORMAL HIGH (ref 4.6–6.0)
Lithium Lvl: 1.14 meq/L (ref 0.80–1.40)
Lymphocytes Relative: 28.2 % (ref 12.0–46.0)
Monocytes Relative: 8.9 % (ref 3.0–12.0)
Neutro Abs: 6 10*3/uL (ref 1.4–7.7)
Neutrophils Relative %: 58.9 % (ref 43.0–77.0)
RBC: 3.99 M/uL (ref 3.87–5.11)
Total Bilirubin: 0.8 mg/dL (ref 0.3–1.2)
Total Protein: 6.6 g/dL (ref 6.0–8.3)
WBC: 10.2 10*3/uL (ref 4.5–10.5)

## 2010-10-06 NOTE — Progress Notes (Signed)
Summary: refill request for gabapentin  Phone Note Refill Request Message from:  Fax from Pharmacy  Refills Requested: Medication #1:  GABAPENTIN 300 MG CAPS Take 1 tablet by mouth three times a day   Last Refilled: 10/22/2009 Faxed request from [facility], [phone].  Initial call taken by: Marty Heck CMA,  November 25, 2009 11:54 AM  Follow-up for Phone Call        ok to refill until 03/2010 per protocols Follow-up by: Owens Loffler MD,  November 25, 2009 12:03 PM  Additional Follow-up for Phone Call Additional follow up Details #1::        Called to pharmacy. Additional Follow-up by: Marty Heck CMA,  November 25, 2009 12:20 PM    Prescriptions: GABAPENTIN 300 MG CAPS (GABAPENTIN) Take 1 tablet by mouth three times a day  #90 x 4   Entered by:   Marty Heck CMA   Authorized by:   Owens Loffler MD   Signed by:   Marty Heck CMA on 11/25/2009   Method used:   Telephoned to ...       [facility] (retail)       [place], [place]  [address]       Ph: [phone]       Fax: [phone]   RxID:   [id number]

## 2010-10-06 NOTE — Progress Notes (Signed)
Summary: refill request for gabapentin  Phone Note Refill Request Message from:  Fax from Pharmacy  Refills Requested: Medication #1:  GABAPENTIN 300 MG CAPS Take 1 tablet by mouth three times a day   Last Refilled: 03/30/2010 Faxed request from [facility], [phone].  Initial call taken by: Marty Heck CMA,  April 27, 2010 2:17 PM    Prescriptions: GABAPENTIN 300 MG CAPS (GABAPENTIN) Take 1 tablet by mouth three times a day  #90 x 0   Entered and Authorized by:   Owens Loffler MD   Signed by:   Owens Loffler MD on 04/27/2010   Method used:   Electronically to        [facility] (retail)       [place], [place]  [address]       Ph: [phone]       Fax: [phone]   RxID:   [id number]

## 2010-10-06 NOTE — Progress Notes (Signed)
Summary: refill request for gabapentin  Phone Note Refill Request Message from:  Fax from Pharmacy  Refills Requested: Medication #1:  GABAPENTIN 300 MG CAPS Take 1 tablet by mouth three times a day   Last Refilled: 02/23/2010 Faxed request from [facility].   [phone]  Initial call taken by: Marty Heck CMA,  March 30, 2010 3:37 PM  Follow-up for Phone Call        follow-up with Dr. Diona Browner needed.  set up with patient.   Follow-up by: Owens Loffler MD,  March 30, 2010 3:39 PM  Additional Follow-up for Phone Call Additional follow up Details #1::        Left message with pt's husband for her to call.    Marty Heck CMA  March 31, 2010 11:50 AM     Additional Follow-up for Phone Call Additional follow up Details #2::    Patient advised.Cathlean Cower CMA    Prescriptions: GABAPENTIN 300 MG CAPS (GABAPENTIN) Take 1 tablet by mouth three times a day  #90 x 0   Entered and Authorized by:   Owens Loffler MD   Signed by:   Owens Loffler MD on 03/30/2010   Method used:   Electronically to        [facility] (retail)       [place], [place]  [address]       Ph: [phone]       Fax: [phone]   RxID:   [id number] GABAPENTIN 300 MG CAPS (GABAPENTIN) Take 1 tablet by mouth three times a day  #90 x 0   Entered and Authorized by:   Owens Loffler MD   Signed by:   Owens Loffler MD on 03/30/2010   Method used:   Telephoned to ...       [facility] [phone]* (retail)       [address]       [place], [place]  [address]       Ph: [phone]       Fax: [phone]   RxID:   [id number]

## 2010-10-06 NOTE — Progress Notes (Signed)
Summary: refill request for gabapentin  Phone Note Refill Request Message from:  Fax from Pharmacy  Refills Requested: Medication #1:  GABAPENTIN 300 MG CAPS Take 1 tablet by mouth three times a day   Last Refilled: 04/27/2010 Faxed request from [facility], [phone].  Initial call taken by: Marty Heck CMA,  May 25, 2010 3:07 PM  Follow-up for Phone Call        denied, f/u [facility] Follow-up by: Owens Loffler MD,  May 25, 2010 3:48 PM  Additional Follow-up for Phone Call Additional follow up Details #1::        Agree no further refills need appt for CPX . Please scheduled before any further refills.  Additional Follow-up by: Eliezer Lofts MD,  May 26, 2010 11:00 PM    Additional Follow-up for Phone Call Additional follow up Details #2::    Patient gets this medication from dr Merla Riches.Ava   Follow-up by: Zenda Alpers CMA Deborra Medina),  May 27, 2010 8:41 AM

## 2010-10-23 ENCOUNTER — Emergency Department: Payer: Self-pay | Admitting: Emergency Medicine

## 2010-11-28 ENCOUNTER — Emergency Department: Payer: Self-pay | Admitting: Unknown Physician Specialty

## 2010-12-14 ENCOUNTER — Emergency Department: Payer: Self-pay | Admitting: Unknown Physician Specialty

## 2011-01-02 ENCOUNTER — Ambulatory Visit: Payer: Self-pay | Admitting: Internal Medicine

## 2011-01-20 NOTE — Cardiovascular Report (Signed)
[place]. [facility]  Patient:    [name], [name]                       MRN: [id number] Proc. Date: 01/27/00 Adm. Date:  [id number] Disc. Date: [id number] Attending:  Alvie Heidelberg CC:         Cristopher Estimable. Lattie Haw, M.D. [facility]             Bruce R. Olevia Perches, M.D. [facility]             Iona Beard, M.D.             Cardiopulmonary Laboratory                        Cardiac Catheterization  INDICATIONS:  [name] is 45 years old and has multiple risks factors for coronary disease including hypertension, diet controlled diabetes and a positive family history for coronary disease.  She recently was admitted to [facility] and seen in consultation by Dr. Lattie Haw with symptoms of chest pain, chest tightness and shortness of breath.  She had a chronic history of shortness of breath with exertion.  Her symptoms were suggestive of angina and a decision was made to refer her here for evaluation of angiography.  DESCRIPTION OF PROCEDURE:  The procedure was performed via the right femoral artery using an arterial sheath and 6 French preformed coronary catheters.  A front wall arterial puncture was performed and Omnipaque contrast was used. A distal aortogram was performed to rule out renovascular cause of hypertension.  The patient tolerated the procedure well and left the laboratory in satisfactory condition.  RESULTS:  The aortic pressure was 142/89 with a mean of 111.  The left ventricular pressure was 142/18.  The left main coronary artery:  The left main coronary artery was free of significant disease.  Left anterior descending:  The left anterior descending artery gave rise to two diagonal branches, two septal perforators.  These in the LAD were free of significant disease.  Circumflex artery:  The circumflex artery was a moderately large vessel that gave rise to an intermediate branch, marginal branch, and three posterolateral branches.  These vessels were  free of significant disease.  Right coronary artery:  The right coronary was a moderate sized vessel that was very tortuous.  It gave rise to a conus branch, a marginal branch, a posterior descending branch, and a small posterolateral branch.  These vessels were free of significant disease.  LEFT VENTRICULOGRAPHY:  The left ventriculogram performed in the RAO projection showed good wall motion with no areas of hypokinesis.  The estimated ejection fraction was 60%.  AORTOGRAM:  The aortogram performed showed no renal artery stenosis and no significant aortoiliac obstruction.  CONCLUSIONS:  Normal coronary angiography and left ventricular wall motion.  RECOMMENDATIONS:  Reassurance.  The etiology of the patients chest pain is not clear.  In view of these findings, I think it is very unlikely this is cardiac.  She does not have any typical symptoms of reflux.  Her chest pain may be chest wall pain.  We will plan reassurance and we will plan discharge in the morning with followup with Dr. Berdine Addison.  He can make a decision if she has persistent symptoms about further evaluation. DD:  01/27/00 TD:  01/31/00 Job: [id number]

## 2011-01-20 NOTE — Cardiovascular Report (Signed)
[name], [name]                ACCOUNT NO.:  [id number]   MEDICAL RECORD NO.:  [id number]          PATIENT TYPE:  INP   LOCATION:  5110                         FACILITY:  [facility]   PHYSICIAN:  Birdie Riddle, M.D.  DATE OF BIRTH:  09/12/1965   DATE OF PROCEDURE:  01/31/2005  DATE OF DISCHARGE:                              CARDIAC CATHETERIZATION   PROCEDURE:  Left heart catheterization, selective coronary angiography, left  coronary function study.   INDICATIONS FOR PROCEDURE:  This 45 year old white female had atypical chest  pain along with cardiac risk factors of diabetes, hypertension,  hyperlipidemia, and family history of coronary artery disease.   APPROACH:  Right femoral artery using 5 French sheath and catheters, a no  torque right Judkins was used for right coronary artery visualization.   COMPLICATIONS:  None.   HEMODYNAMIC DATA:  The left ventricular pressure was 92/8 and aortic  pressure was 91/59/73.   Coronary anatomy:  The left main coronary artery was unremarkable.   Left anterior descending coronary artery:  Left anterior descending coronary  artery was essentially unremarkable except for the distal half of the vessel  was somewhat diffusely narrowed. Its diagonal 1 vessel was unremarkable and  diagonal 2 was a smaller vessel.   Left circumflex coronary artery:  The left circumflex coronary artery was  unremarkable and it had a smaller ramus and obtuse marginal branch 1 and  obtuse marginal 2 and 3 were unremarkable.   Right coronary artery:  The right coronary artery had ostial 20% lesions  with a 60% spasm probably catheter induced and it improves with 200 mcg of  IC nitroglycerin to near normal with a good flow. The posterior descending  coronary artery was unremarkable.   Left ventriculogram:  The left ventriculogram showed normal left ventricular  systolic function with ejection fraction of 65-70%, there was no mitral  regurgitation.   IMPRESSION:  1.  One vessel native mild disease with spasm (right coronary artery).  2.  Normal left ventricular systolic function.   RECOMMENDATIONS:  This patient will be treated medically. She will continue  her Ace inhibitor and Lipitor and will add Norvasc as tolerated.       ASK/MEDQ  D:  01/31/2005  T:  01/31/2005  Job:  [id number]

## 2011-01-20 NOTE — Discharge Summary (Signed)
[place]. [facility]  Patient:    [name], [name]                       MRN: [id number] Adm. Date:  [id number] Disc. Date: [id number] Attending:  Alvie Heidelberg Dictator:   Margarita Sermons, P.A.C. [facility] CC:         Leilani Merl, M.D. at [facility]                  Referring Physician Discharge [place]  HISTORY OF PRESENT ILLNESS:  [name] is a 45 year old white female with hypertension and family history of coronary disease, who had chest pain and ruled out.  She was transferred here from [facility] for further evaluation including cardiac catheterization.  LABORATORY DATA:  Obtained at [facility], included a normal urinalysis, negative CPK-MBs and troponins.  BUN was 10 with a creatinine of 0.8. Electrolytes were normal.  LFTs were normal.  Hemoglobin 13.1 with a hematocrit of 39.2 and a white count of 14,500.  COURSE IN THE HOSPITAL:  The patient was received in transfer and had a cardiac catheterization by Dr. Olevia Perches which was totally normal.  Ejection fraction was 60%.  Her abdominal aorta, renals, and iliacs were all normal. She was discharged home the following day.  FINAL DIAGNOSES: 1. Noncardiac chest pain. 2. Normal left ventricular function. 3. History of hypertension. 4. Family history of coronary disease. 5. Leukocytosis - she remained afebrile with no obvious explanation for her    leukocytosis.  This will be followed as an outpatient.  DISPOSITION:  FOLLOW-UP:  With Dr. Verner Chol with [facility].  MEDICATIONS:  She is to take Pepcid AC p.r.n. and continue her atenolol and BuSpar as prior to admission. DD:  01/28/00 TD:  01/28/00 Job: [id number]

## 2011-01-20 NOTE — Op Note (Signed)
[facility]  Patient:    [name], [name]                       MRN: [id number] Proc. Date: 02/10/00 Adm. Date:  [id number] Disc. Date: [id number] Attending:  Excell Seltzer Tappan                           Operative Report  CCS NUMBER:  [id number]  PREOPERATIVE DIAGNOSIS:  Symptomatic cholelithiasis.  POSTOPERATIVE DIAGNOSIS:  Symptomatic cholelithiasis.  PROCEDURE:  Laparoscopic cholecystectomy with intraoperative cholangiogram.  SURGEON:  Marland Kitchen T. Hoxworth, M.D.  ASSISTANT:  ANESTHESIA:  INDICATIONS:  [name] is a 45 year old female who has had episodes of right upper quadrant abdominal pain for a couple of years and negative ultrasound in the past.  She now presents with two weeks of more persistent daily severe right upper quadrant abdominal pain, nausea, and vomiting.  A repeat ultrasound has shown multiple small gallstones.  She is felt to have symptomatic cholelithiasis and a laparoscopic cholecystectomy with cholangiogram has been recommended and accepted.  The nature of the procedures, its indications, and risks of bleeding, infection, bile leak, and bile duct injury were discussed and understood preoperatively.  She is now brought to the operating room for this procedure.  DESCRIPTION OF PROCEDURE:  The patient was brought to the operating room and placed in the supine position on the operating table and general endotracheal anesthesia was induced.  PAFs were placed.  The abdomen was sterilely prepped and draped.  Broad spectrum antibiotics were given intravenously.  Local anesthesia was used to infiltrate the trocar sites.  A 1 cm incision was made at the umbilicus and dissection carried down through the midline fascia.  This was sharply incised transversely for 1 cm and the peritoneum entered under direct vision.  Through a mattress suture of 0 Vicryl, the Hasson trocar was placed and pneumoperitoneum established.  Under  direct vision, a 10 mm trocar was placed in the subxiphoid area and two 5 mm trocars along the right subcostal margin.  The gallbladder was visualized and somewhat distended, though not acutely inflamed.  The fundus was grasped and elevated up over the liver and the infundibulum retracted inferolaterally.  Fibrofatty tissue was stripped off of the neck of the gallbladder toward the porta hepatis.  The distal gallbladder was thoroughly dissected and the cystic duct/gallbladder junction identified and dissected 360 degrees.  The triangle of Calot was thoroughly dissected and the cystic artery dissected free.  When the ______ was clear, the cystic duct was clipped at its junction with the gallbladder and through a small opening in the cystic duct, operative cholangiograms were obtained.  This showed good filling of small to normal sized intrahepatic and common bile ducts with free flow into the duodenum and no filling defects. Following this, the catheter was removed and the cystic duct and cystic artery were doubly clipped proximally and divided.  The gallbladder was then dissected free from its bed using hook and spatula cautery and removed through the umbilicus.  Hemostasis was assured in the gallbladder bed and the right upper quadrant irrigated and suctioned until clear.  Trocars were removed under direct vision.  All CO2 was evacuated from the peritoneal cavity.  The pursestring suture was secured at the umbilicus.  Skin incisions were closed with a subcuticular 4-0 Monocryl and Steri-Strips.  The sponge, needle, and instrument counts were correct.  Dry sterile dressings  were applied.  The patient was taken to recovery in good condition. DD:  02/10/00 TD:  02/14/00 Job: [id number]

## 2011-01-20 NOTE — Assessment & Plan Note (Signed)
[place] OFFICE NOTE   NAME:HUMBLEGiavanna, Kang                       MRN:          [id number]  DATE:12/04/2006                            DOB:          1965-12-22    NEW PATIENT HISTORY AND PHYSICAL:   CHIEF COMPLAINT:  A 45 year old female here to establish new doctor.   HISTORY OF PRESENT ILLNESS:  Ms. Yaeger previously went to the [facility] and would like to switch to our practice.  She has the  following concerns:  1. Diabetes mellitus, poorly controlled:  She comes to clinic today      with hemoglobin A1C done within the last month of 10.8.  She states      she takes Novolin N 40 units twice daily and has been on this dose      for quite some time.  She states that she takes it regularly and is      very compliant.  She is not compliant with eating habits or      exercise.  She denies any hypoglycemia.  She states her fasting      blood sugars run around 170 to 200.  At bedtime, the blood sugars      are frequently 300, but occasionally in the upper 100s.  2. Hypertriglyceridemia.  The papers she brings with her today show a      triglyceride level in the thousands.  LDL was unable to be      determined, given the high level of triglycerides.  She states she      was on a cholesterol medication in the past, but just stopped      taking it.  She denied any side effects from it in the past.  3. Chronic diarrhea.  She states that she has had problems with      diarrhea for many years.  She has about 7-8 bowel movements per      day.  She states that she occasionally has blood in her stool, but      does have hemorrhoids that get very irritated and painful.  She had      what sounds like a flexible sigmoidoscopy in the past when she      previously saw Dr. Council Mechanic in the mid 1980s.  She states that they      stated they saw rectal tears and recommended a balloon test.  This      unclear test was  abnormal, and they recommended and additional      test, which she never had done.  She denies any abdominal pain,      except for dysmenorrhea that she is currently experiencing, but      that is only associated with her menstrual cycles.  4. Dysmenorrhea, chronic.  She states that she does not take any      medication for this, but has an aching, cramping sensation over her      lower abdomen with menstrual cycle.  She states her period is      fairly  regular, but she feels that she may be starting to go      through the change because she had some hot flashes at night.  5. Elevated TSH.  On the labs she brought with her today, there is      evidence of an elevated TSH, but a normal T3 and T4.  She would      like to discuss the implications of these tests.   REVIEW OF SYSTEMS:  Otherwise negative.   PAST MEDICAL HISTORY:  1. Asthma, moderately persistent.  2. Type 2 diabetes.  3. Bipolar disorder.  4. Menstrual migraines.  5. Hypertension.  6. Hypercholesterolemia.  7. Allergic rhinitis.  8. Interstitial cystitis.   HOSPITALIZATIONS/SURGERIES/PROCEDURES:  1. 1985 - BTL.  2. Bladder biopsy.  3. Treatment for interstitial cystitis.  4. 2000 - cholecystectomy.  5. Hospitalized many times for bipolar disorder since 2000 associated      with medication changes.  6. Three suicide attempts, with the last one in April of 2007 with her      trying to hang herself.  7. 2007 - stress test.   ALLERGIES:  1. VICODIN.  2. CODEINE.  3. REMERON.   MEDICATIONS:  1. Metformin 1000 mg one p.o. b.i.d.  2. Quinapril 10 mg daily.  3. Novolin N 40 units b.i.d.  4. Lithium 500 mg p.o. t.i.d.  5. Depakote 1000 mg two tablets p.o. q.a.m. and one tablet p.o. q.p.m.  6. Cymbalta 120 mg two tablets daily.  7. Aspirin 325 mg daily.  8. Seroquel 600 mg p.o. q.h.s.  9. Lamictal 75 mg p.o. q.h.s.  10.Advair.  11.Albuterol M.D.I. p.r.n.   SOCIAL HISTORY:  She does not smoke.  No alcohol and no  drug use.  She  is disabled secondary to bipolar disorder.  She has been married for 8  years and denies any current abuse.  She has 2 children, one who has a  heart valve disorder, but otherwise they are health.  She gets no  regular exercise.  She has a poor diet and eats lots of fast food.   FAMILY HISTORY:  Father deceased at age 55 from suicide; questionable  bipolar disorder.  He also had a stroke at age 10.  Mother alive at age  37 with coronary artery disease status post MI at age 27,  hypercholesterolemia, high blood pressure and diabetes, as well as  congestive heart failure and kidney failure.  She has 1 brother with  kidney disease, unknown type.  She has a cousin, age 4, with breast  cancer.  Paternal grandmother with lung cancer.   PHYSICAL EXAMINATION:  VITAL SIGNS:  Height 60.25 inches, weight 170.6.  Blood pressure 112/70, pulse 92, temperature 98.3.  GENERAL:  Obese-appearing female in no apparent distress.  HEENT:  Pupils equal, round and reactive to light and accommodation.  Extraocular muscles intact.  Oropharynx clear.  Tympanic membranes  clear.  Nares clear.  No thyromegaly.  No lymphadenopathy.  PSYCHIATRIC:  Appropriate affect.  No hallucinations.  No suicidal  ideation.  CARDIOVASCULAR:  Regular rate and rhythm.  No murmurs, rubs, or gallops.  Normal PMI.  There were 2+ peripheral pulses.  No peripheral edema.  PULMONARY:  Clear to auscultation bilaterally.  No wheezing, rales, or  rhonchi.  ABDOMEN:  Soft.  Diffusely tender over bilateral lower quadrants over  adnexa.  No rebound, no guarding.  No hepatosplenomegaly.  MUSCULOSKELETAL:  Strength 5/5 in upper and lower extremities.  NEUROLOGIC:  Cranial nerves II-XII  grossly intact.  Sensation intact in  upper and lower extremities.   ASSESSMENT AND PLAN:  1. Diabetes mellitus, poorly controlled.  We will start by sending her     to a refresher diabetes nutrition course.  She will start keeping a       blood sugar diary.  We will increase her Novolin N to 45 units p.o.      b.i.d., given that both her morning and bedtime blood sugars are      significantly elevated.  She will keep an eye out for hypoglycemia.      She will return in a few weeks to review how she is doing with her      blood sugars.  2. Severe hypertriglyceridemia.  I will start by treating her with      Fenofibrate 160 mg daily.  We will recheck her triglycerides in 3      months.  3. Chronic diarrhea.  I will obtain records from her previous doctor      to determine what has been done in the past, given that this is a      very chronic issue.  She is requesting a colonoscopy today, and      this may be necessary, given her age and the blood in her stool.  4. Dysmenorrhea, chronic.  We will start by having her try naproxen      250 mg q.8h. p.r.n. abdominal cramping.  If she has abdominal upset      with this medication, she will stop it.  5. Elevated TSH.  Her T3 and T4 were normal, so at this point in time      we will not look at this further.  We can recheck T3 and T4 in 6      months, since they are on the lower ends of normal.  6. Prevention.  She is due to a mammogram and a complete physical      examination with Pap.  This will be scheduled.  We can also      consider doing a tetanus, if this has not been done.  I will obtain      records from previous doctor.     Eliezer Lofts, MD  Electronically Signed    AB/MedQ  DD: 12/04/2006  DT: 12/05/2006  Job #: [id number]

## 2011-01-20 NOTE — Discharge Summary (Signed)
[name], [name]                ACCOUNT NO.:  [id number]   MEDICAL RECORD NO.:  [id number]          PATIENT TYPE:  INP   LOCATION:  7616                         FACILITY:  [facility]   PHYSICIAN:  Birdie Riddle, M.D.  DATE OF BIRTH:  July 06, 1966   DATE OF ADMISSION:  01/29/2005  DATE OF DISCHARGE:  02/01/2005                                 DISCHARGE SUMMARY   The patient was admitted by Dr. Wallene Huh.   PRINCIPAL DIAGNOSIS:  1.  Multivessel native vessel coronary artery disease.  2.  Angina pectoris.  3.  Diabetes mellitus, type 2.  4.  Hypertension.  5.  Hyperlipidemia.  6.  Manic depressive disorder.   PRINCIPAL PROCEDURE:  Left heart catheterization, selective coronary  angiography, left ventricular function study done by Dr. Dixie Dials on Jan 31, 2005.   HISTORY:  This 45 year old female with bipolar disorder, hypertension,  dyslipidemia, hypertension, presented with chest pain along with dyspnea.  The chest pain radiated to the jaw and left arm.   PHYSICAL EXAMINATION:  VITAL SIGNS: Pulse 107, blood pressure 134/66.  GENERAL: The patient is in no acute distress and is somewhat obese.  HEENT: Grossly unremarkable. Sclerae white. Conjunctiva pink.  NECK: No JVD, no carotid bruits.  LUNGS: Clear bilaterally.  HEART: Normal S1 and S2.  ABDOMEN: Soft, nontender.  EXTREMITIES: No edema, cyanosis, or clubbing.  SKIN: Warm and dry.   LABORATORY DATA:  Normal hemoglobin, hematocrit, WBC count, platelet count.  Normal electrolytes, BUN and creatinine. Glucose elevated at 380.  Liver  enzymes are normal. CK elevated at 278, MB 8.5, troponin-I normal times  three. Cholesterol elevated at 276, triglycerides 571, HDL cholesterol 37.   Chest x-ray showed minimal bronchitic changes.   EKG showed a normal sinus rhythm with nonspecific T-wave abnormalities.   Cardiac catheterization showed mild one-vessel disease with coronary artery  spasm and moderate left ventricular  systolic function.   HOSPITAL COURSE:  This patient was admitted to the telemetry  unit.  Myocardial infarction was ruled out; however her CK/MBs were abnormal;  hence, she underwent cardiac catheterization that showed mild one-vessel  coronary artery disease. The right coronary artery had residual ostial 20%  lesion. It had significant spasm probably catheter induced which improved  with intracoronary 200 mcg nitroglycerin infusion. Her left ventricular  systolic function was normal. [name] condition otherwise was  unremarkable and she was discharged home on Feb 01, 2005, in satisfactory  condition with addition of calcium channel blocker and addition of  nitroglycerin tablet as needed.   [name] discharge medications were  1.Depakote 500 mg t.i.d.  1.  Lipitor 40 mg one daily.  2.  Zyrtec 10 mg one daily.  3.  Naprosyn 500 mg b.i.d.  4.  Seroquel 200 mg three at bedtime.  5.  Aspirin 81 mg daily.  6.  Altace 5 mg daily.  7.  Novolin 45 units in the morning and 55 units in the evening.  8.  Cymbalta 30 mg b.i.d.  9.  The patient is to restart Glucophage after two days of no use,.  10. Norvasc 5 mg daily.  11. Nitroglycerin tablet 0.4 mg one sublingual every five minutes times      three as needed.   The patient is to have follow up with Dr. Dalbert Mayotte in two to weeks. She  is to call [phone] for an appointment and to call my office and see me in  two weeks by calling [phone].   The patient had activity restriction for two days and dietary restriction of  low-fat, low-salt, 1500 calorie diabetic diet.   She is to notify me if she has right groin pain, swelling, or discharge.      Birdie Riddle, M.D.  Electronically Signed     ASK/MEDQ  D:  03/29/2005  T:  03/29/2005  Job:  [id number]

## 2011-02-03 ENCOUNTER — Ambulatory Visit: Payer: Self-pay | Admitting: Internal Medicine

## 2011-02-10 ENCOUNTER — Emergency Department: Payer: Self-pay | Admitting: Emergency Medicine

## 2011-02-14 IMAGING — CR DG CHEST 2V
1 series · 2 of 2 positions shown · non-contrast
Comparison: none

REASON FOR EXAM: chest pain
COMMENTS:

PROCEDURE:     DXR - DXR CHEST PA (OR AP) AND LATERAL  - July 13, 2009  [DATE]
RESULT:     Comparison is made to the prior exam of 01/06/2009. The lung
fields are clear. No pneumonia, pneumothorax or pleural effusion is seen.
Heart size is normal.

[Series 1: view not recorded · 0.17mm/px · 2 of 2 slices shown]
[im 1/2]
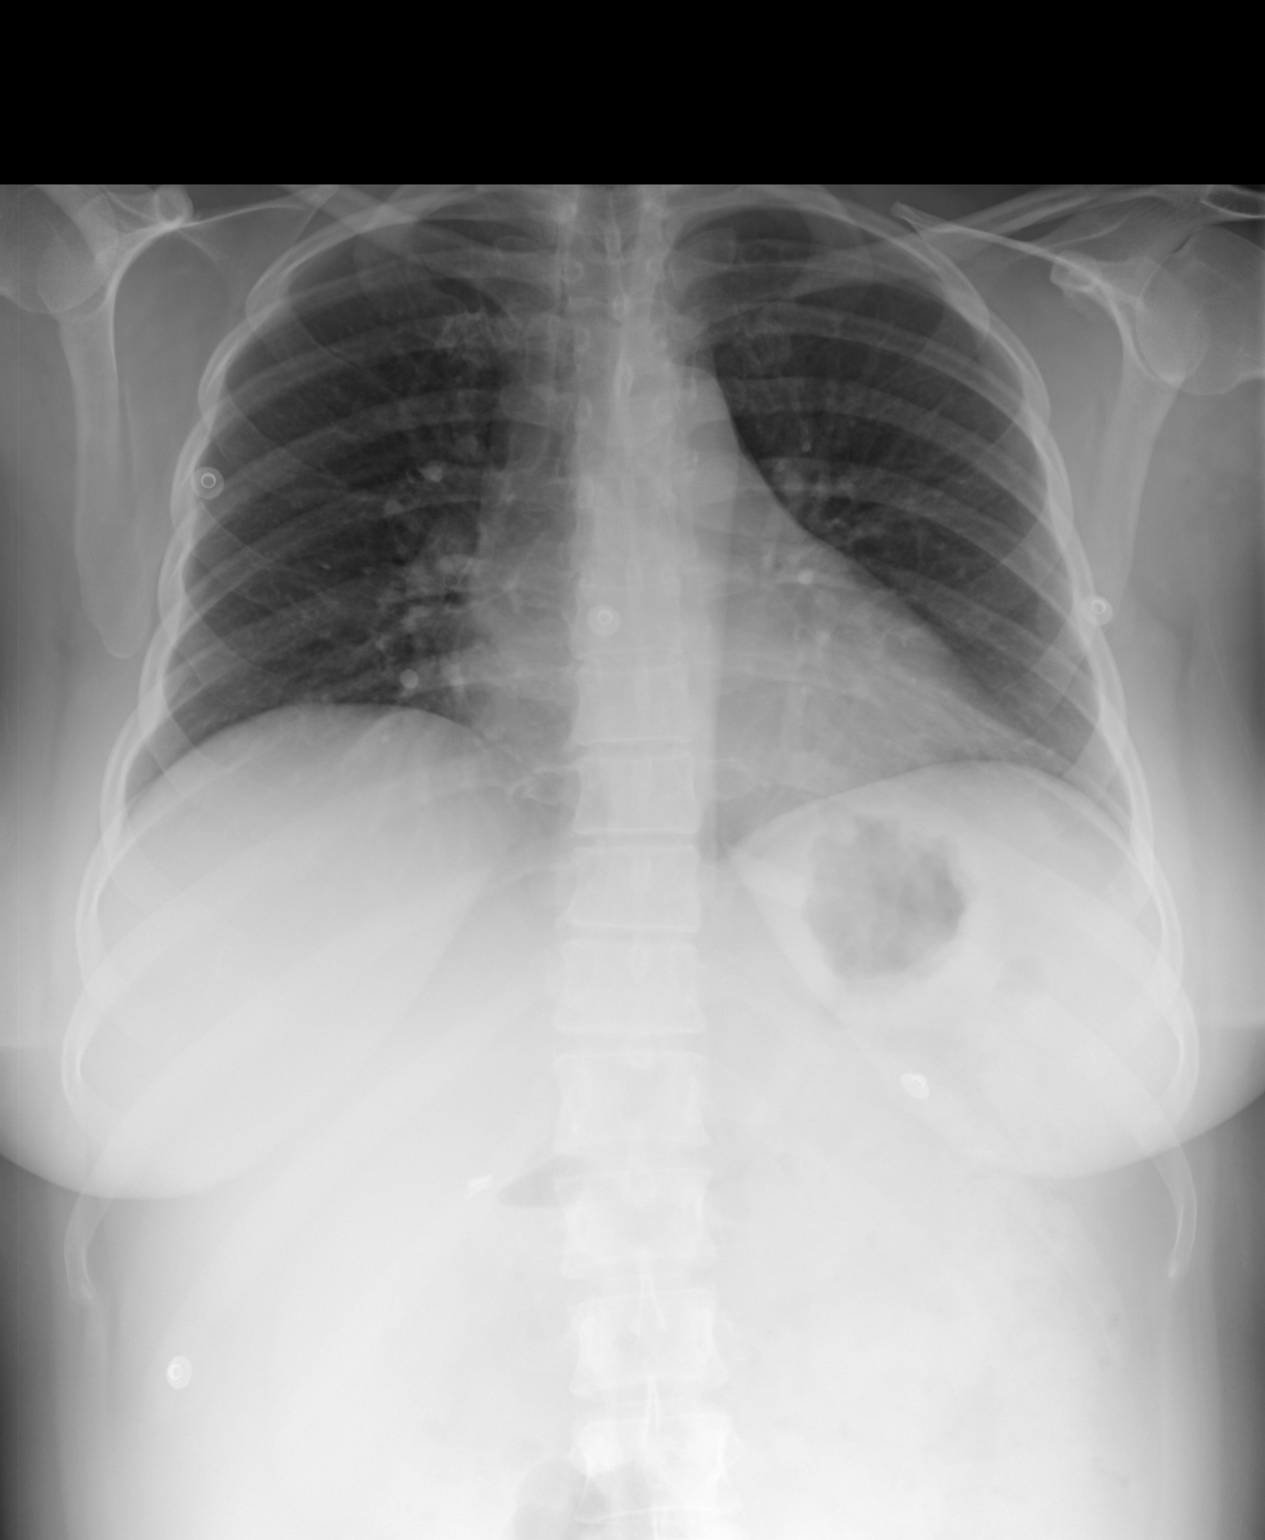
[im 2/2]
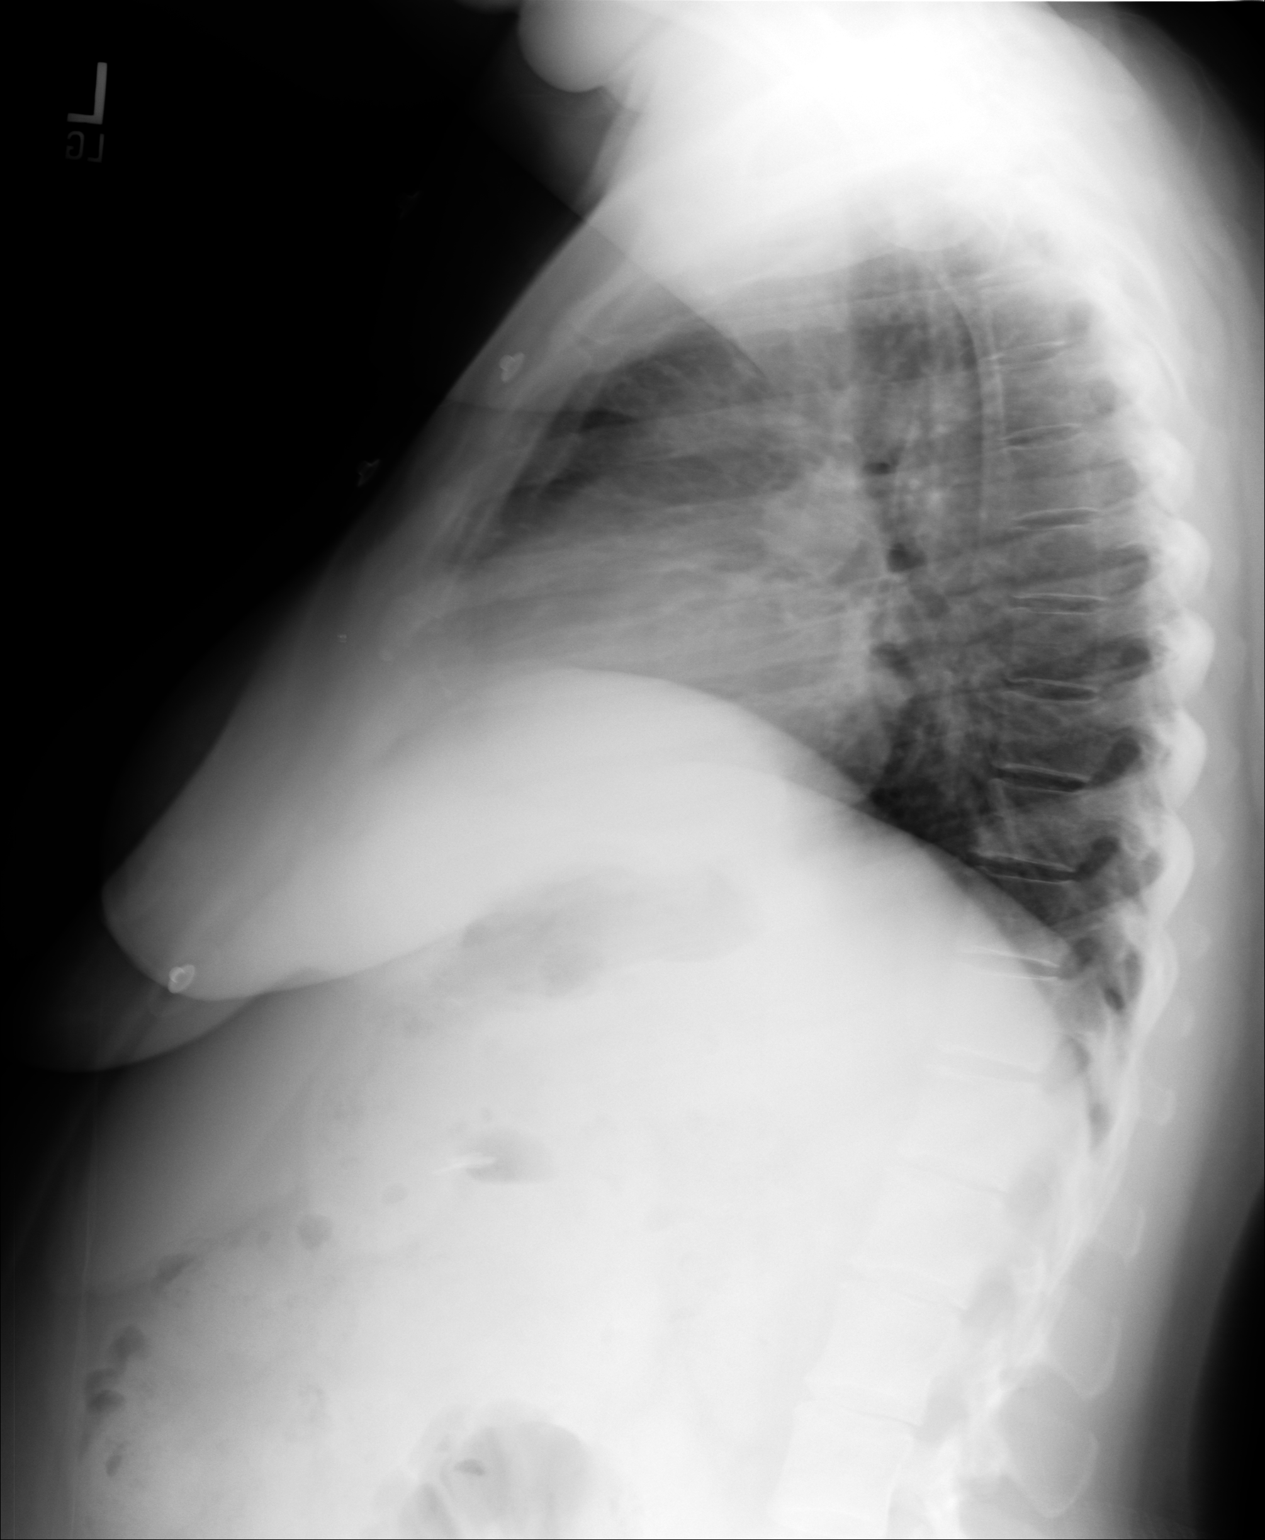

[2 of 2 positions shown; findings below may reference images not displayed]

IMPRESSION: No acute changes are identified.

## 2011-03-11 IMAGING — US US PELV - US TRANSVAGINAL
1 series · 17 of 25 positions shown · non-contrast
Comparison: none

REASON FOR EXAM: right lower Abd/pelvic pain
COMMENTS:   LMP: BTL

[Series 1: us pelv - us transvaginal · 17 of 53 slices shown]
[im 1/53]
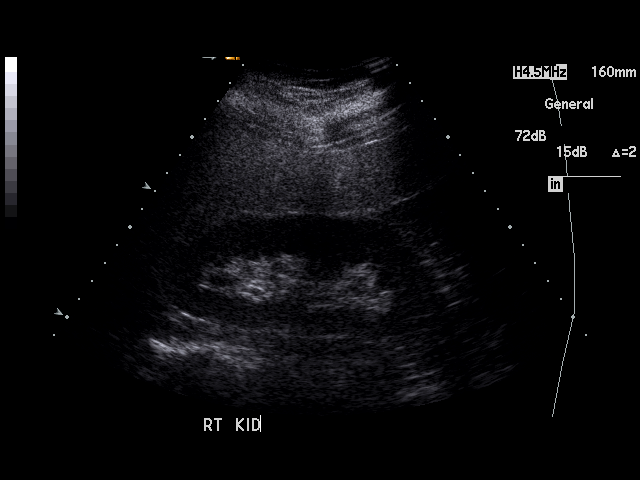
[im 5/53]
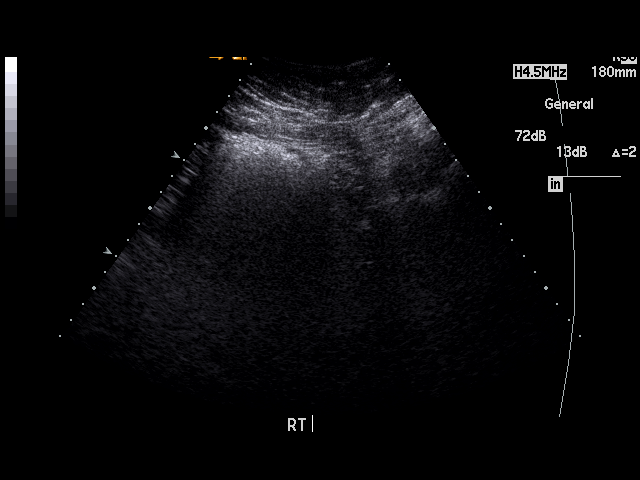
[im 7/53]
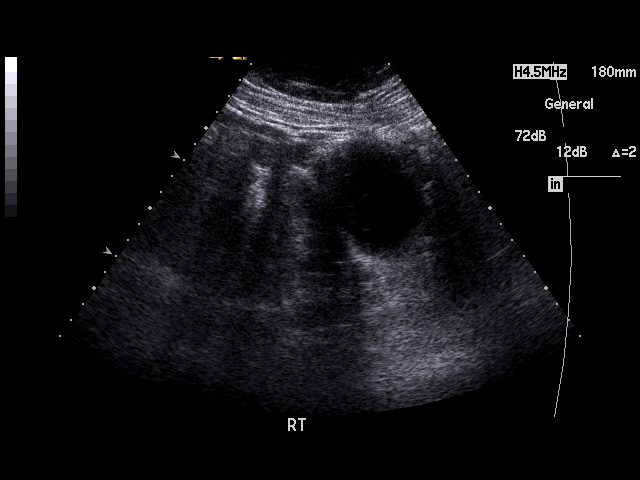
[im 11/53]
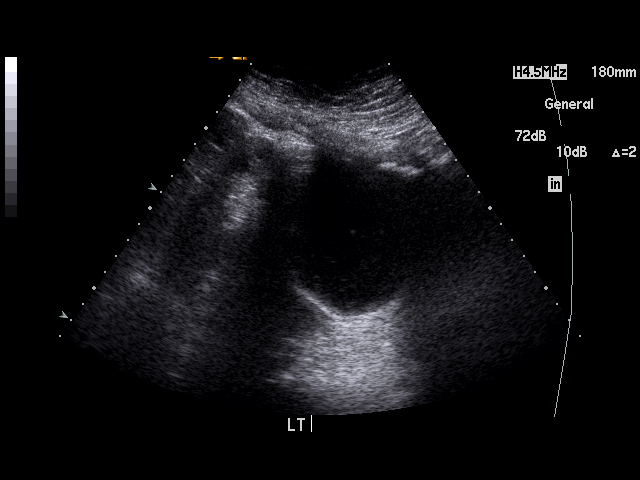
[im 14/53]
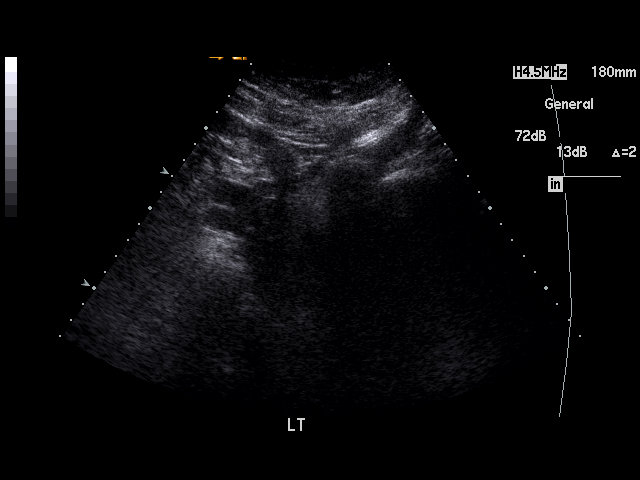
[im 18/53]
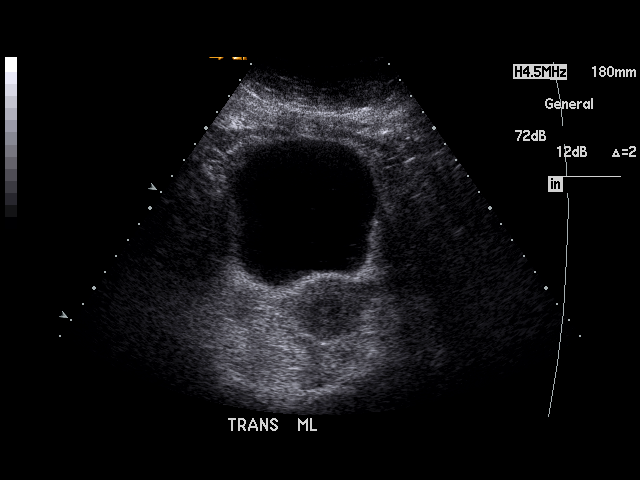
[im 20/53]
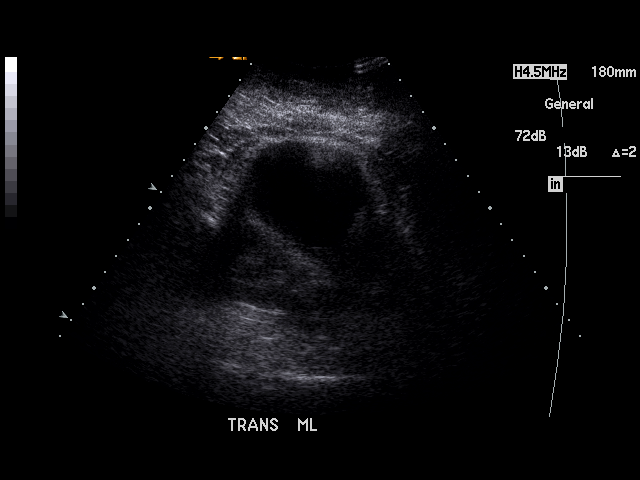
[im 24/53]
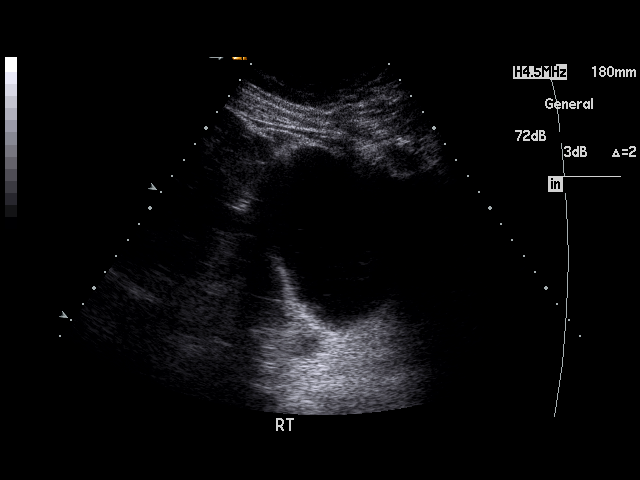
[im 27/53]
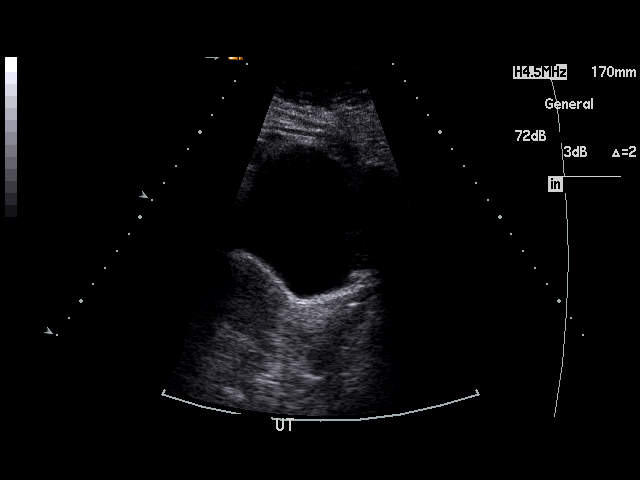
[im 29/53]
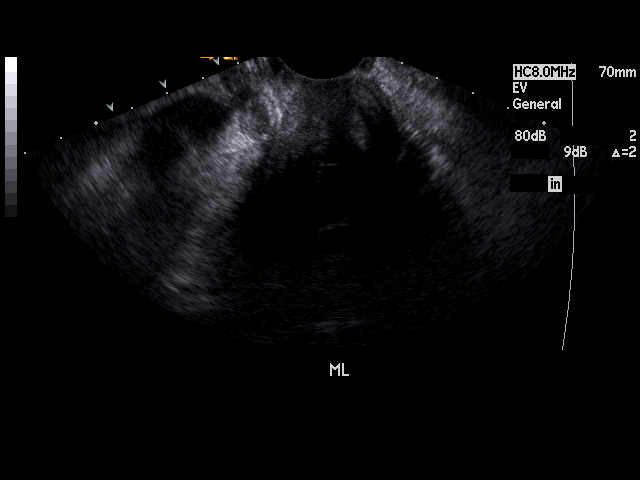
[im 33/53]
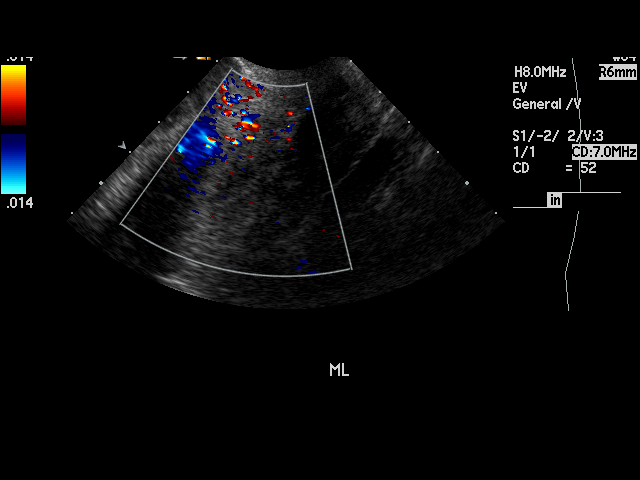
[im 35/53]
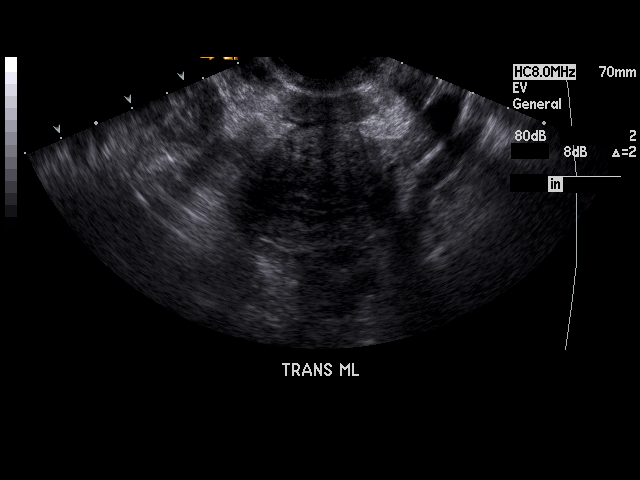
[im 40/53]
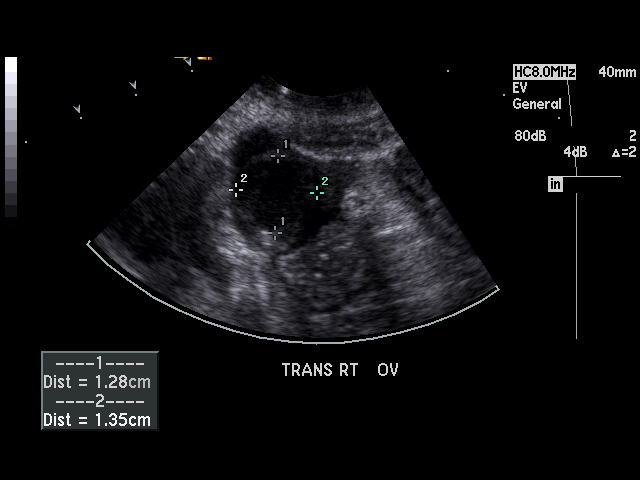
[im 42/53]
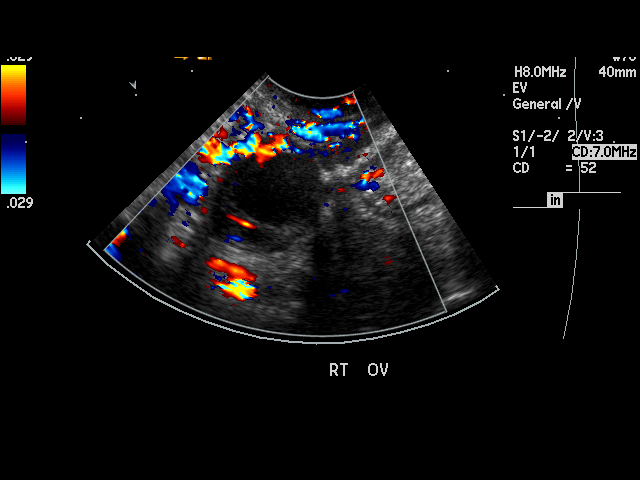
[im 46/53]
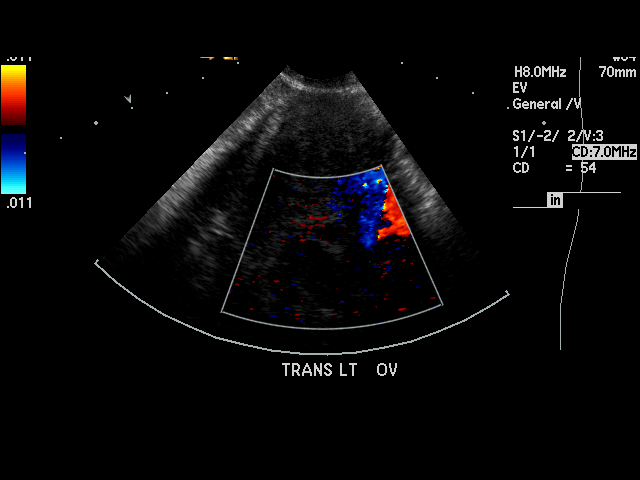
[im 48/53]
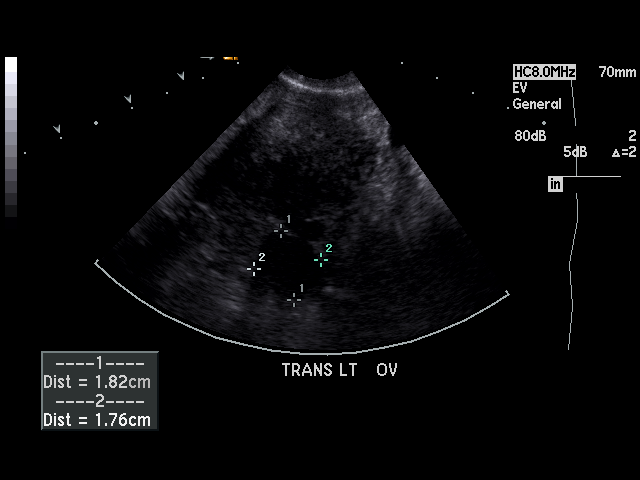
[im 53/53]
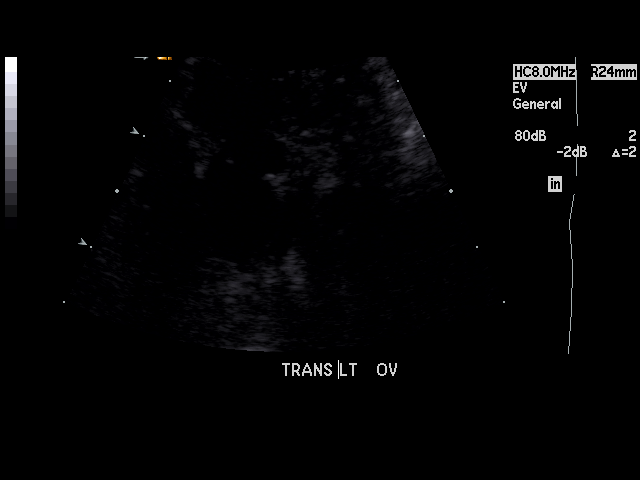

[17 of 25 positions shown; findings below may reference images not displayed]

PROCEDURE:     US  - US PELVIS EXAM W/TRANSVAGINAL  - August 07, 2009  [DATE]

RESULT:     The uterus is normal in echotexture and contour. It measures 8 x
3.5 x 3.6 cm. The endometrial stripe is normal at 5 mm. There are nabothian
cysts present. There is no free fluid in the cul-de-sac. The right ovary
measures 2 x 1.8 x 1.4 cm and contains a complex appearing cystic structure
measuring 1.3 cm in diameter. The left ovary measures 2.7 x 2.4 x 2.4 cm. A
left ovarian cystic appearing structure measures 1.8 cm in greatest
dimension. A few internal echoes within this cystic structure noted as well.
IMPRESSION: 1. The uterus is normal in appearance.
2. There are cystic structures associated with both ovaries. The cysts
contain internal echoes and are not clearly simple cystic in nature.

## 2011-03-27 ENCOUNTER — Ambulatory Visit: Payer: Self-pay | Admitting: Internal Medicine

## 2011-04-03 ENCOUNTER — Emergency Department: Payer: Self-pay | Admitting: Emergency Medicine

## 2011-04-10 ENCOUNTER — Emergency Department: Payer: Self-pay | Admitting: *Deleted

## 2011-04-13 ENCOUNTER — Ambulatory Visit: Payer: Self-pay | Admitting: Internal Medicine

## 2011-04-13 HISTORY — PX: LEFT HEART CATH AND CORONARY ANGIOGRAPHY: CATH118249

## 2011-04-14 ENCOUNTER — Emergency Department: Payer: Self-pay | Admitting: Emergency Medicine

## 2011-04-28 ENCOUNTER — Inpatient Hospital Stay: Payer: Self-pay | Admitting: Internal Medicine

## 2011-05-21 ENCOUNTER — Emergency Department: Payer: Self-pay | Admitting: Unknown Physician Specialty

## 2011-06-20 IMAGING — CT CT HEAD WITHOUT CONTRAST
2 series · 15 of 30 positions shown, 19 images · non-contrast
Comparison: none

REASON FOR EXAM: AMS
COMMENTS:

PROCEDURE:     CT  - CT HEAD WITHOUT CONTRAST  - November 16, 2009  [DATE]
RESULT:     Comparison:  01/06/2009, 12/12/2007, 09/09/2006
TECHNIQUE: Multiple axial images from the foramen magnum to the vertex were
obtained without IV contrast.

[Series 2: without · axial · non-contrast · 0.43mm/px · z∈[-224,-98]mm · 13 of 31 slices shown, 17 images]
[im 3/31  brain]
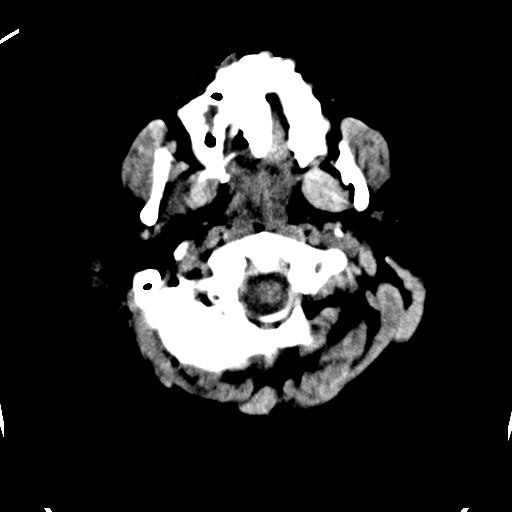
[im 3/31  bone]
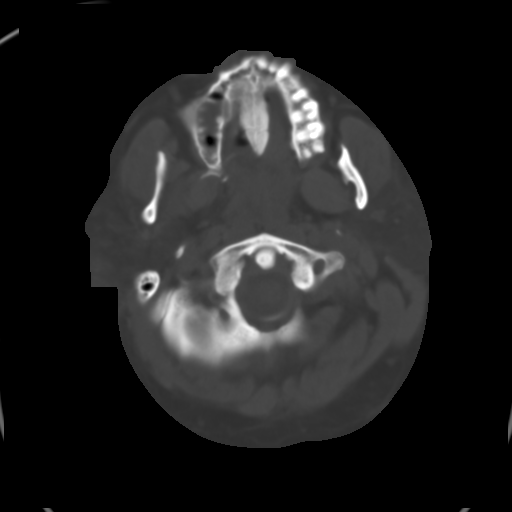
[im 5/31  brain]
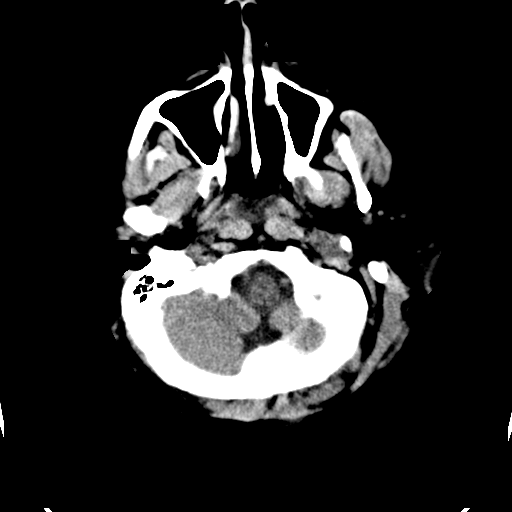
[im 7/31  brain]
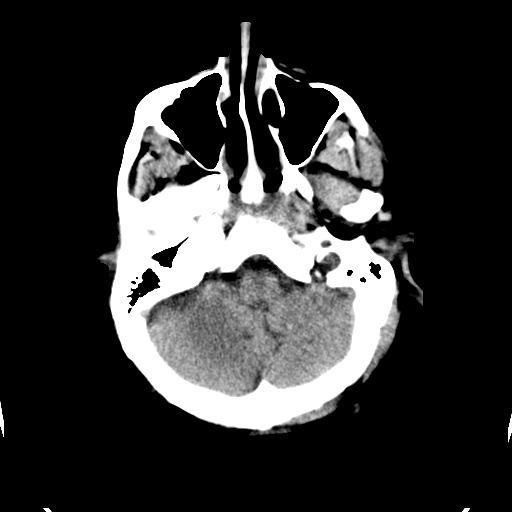
[im 9/31  brain]
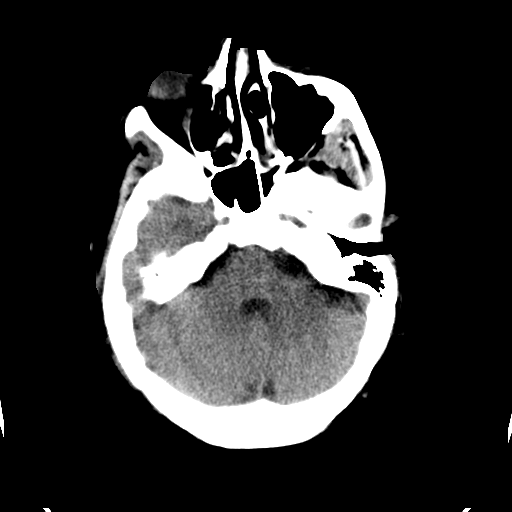
[im 11/31  brain]
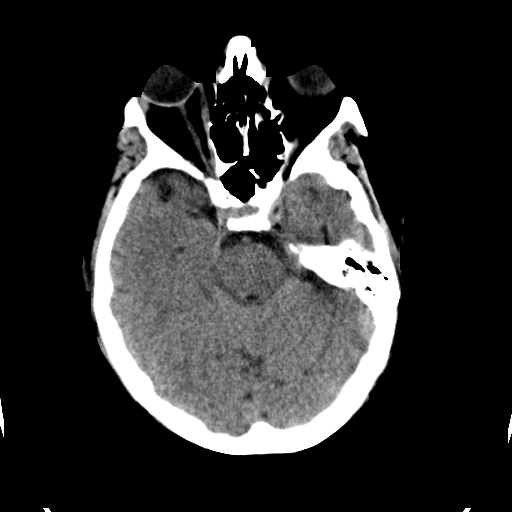
[im 11/31  bone]
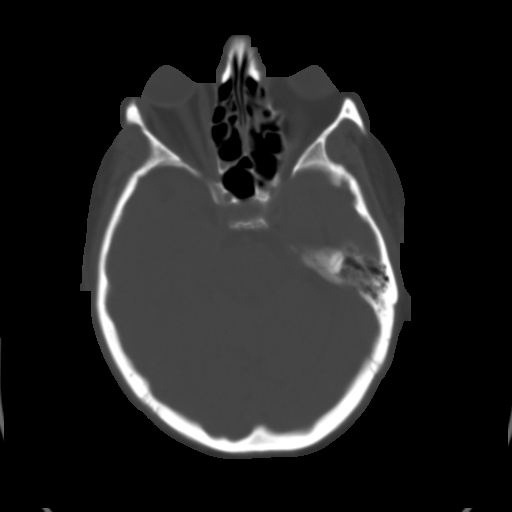
[im 13/31  brain]
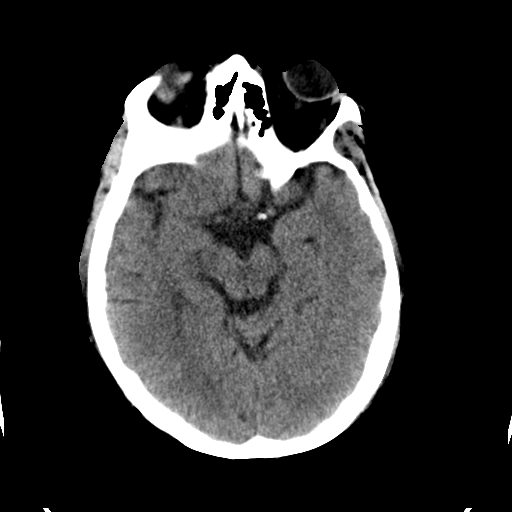
[im 16/31  brain]
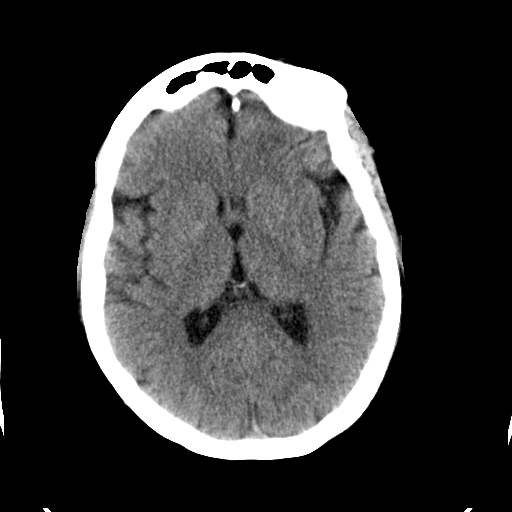
[im 18/31  brain]
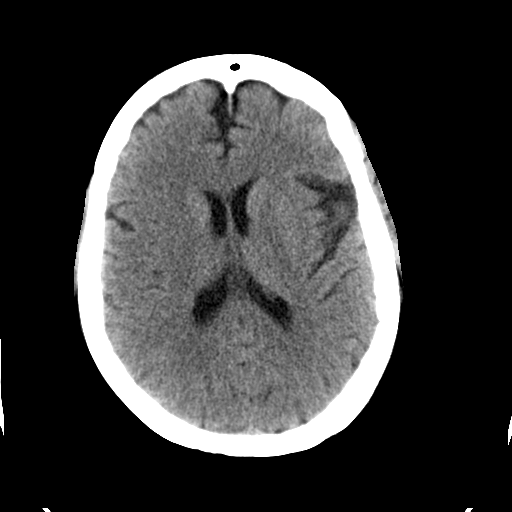
[im 20/31  brain]
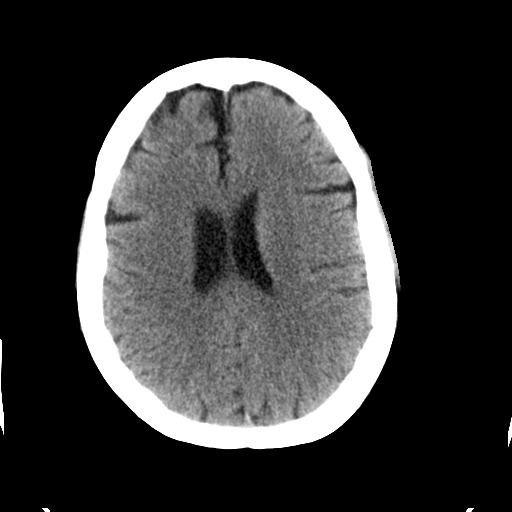
[im 20/31  bone]
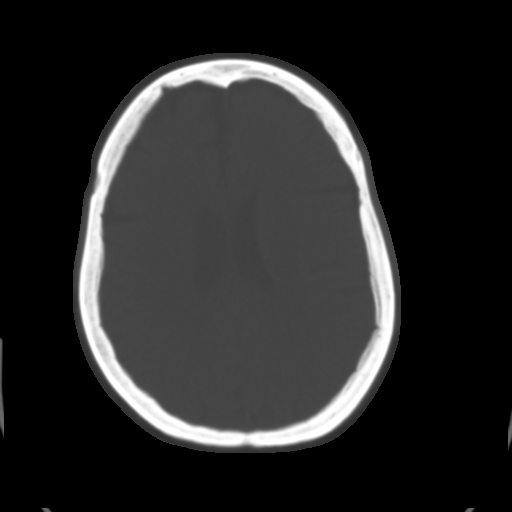
[im 22/31  brain]
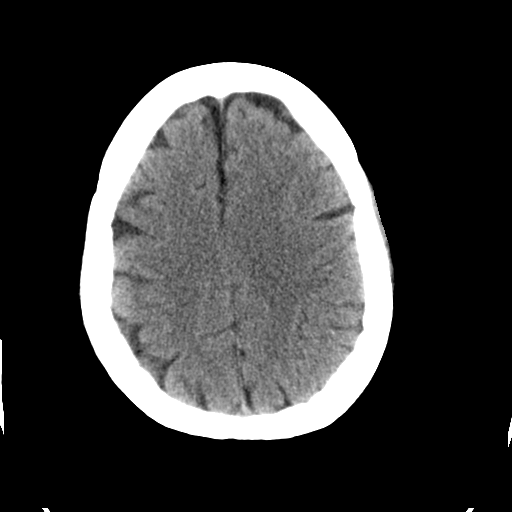
[im 24/31  brain]
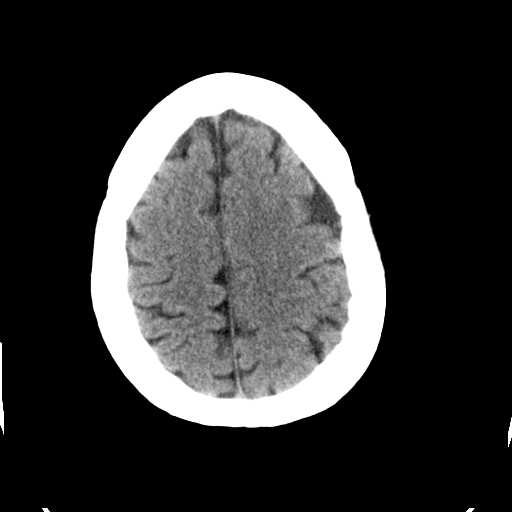
[im 26/31  brain]
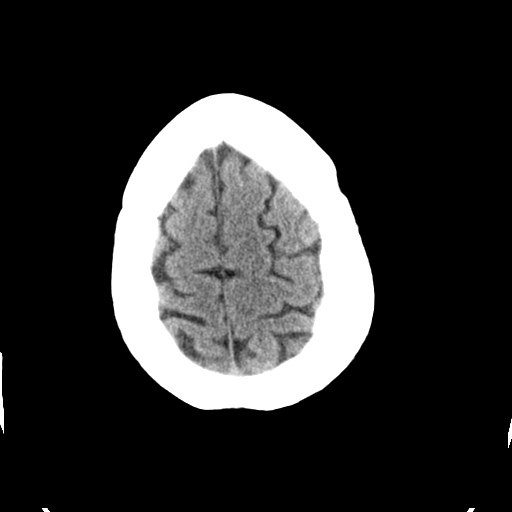
[im 28/31  brain]
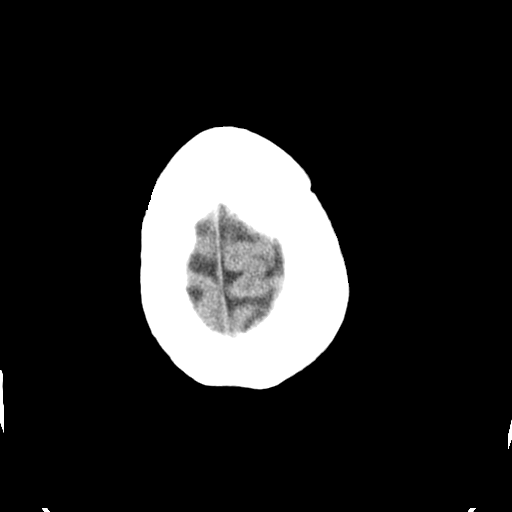
[im 28/31  bone]
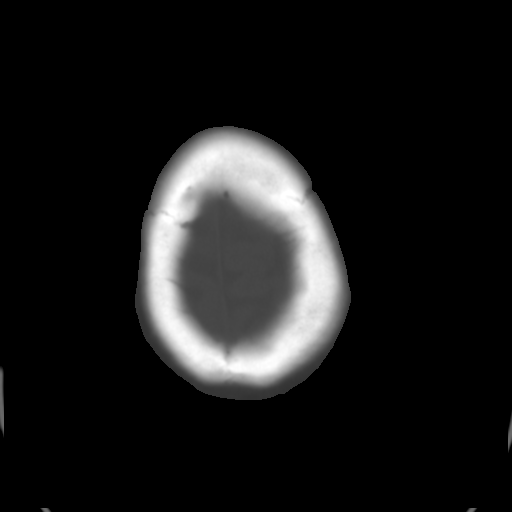

[Series 3: bone · axial · 0.43mm/px · z∈[-224,-204]mm · 2 of 31 slices shown]
[im 3/31  bone]
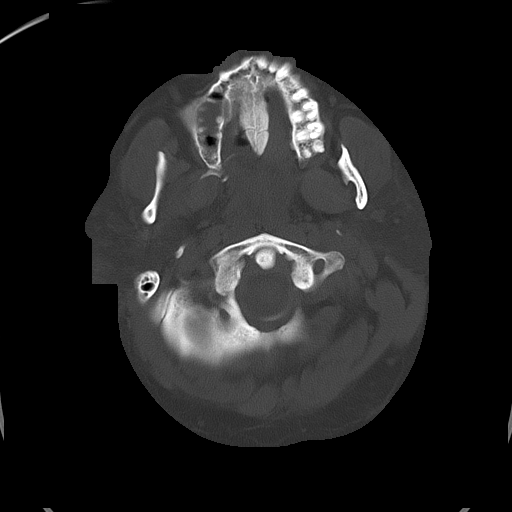
[im 7/31  bone]
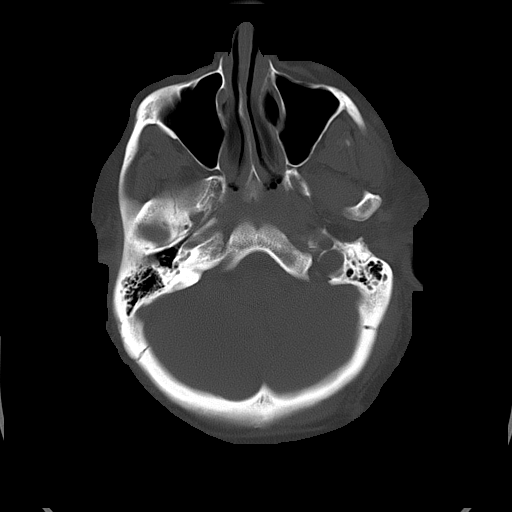

[15 of 30 positions shown; findings below may reference images not displayed]

FINDINGS: There is no evidence of mass effect, midline shift, or extra-axial fluid
collections.  There is no evidence of a space-occupying lesion or
intracranial hemorrhage. There is no evidence of a cortical-based area of
acute infarction.  There is generalized cerebral atrophy.

The ventricles and sulci are appropriate for the patient's age. The basal
cisterns are patent.

Visualized portions of the orbits are unremarkable. There is a mucus
retention cyst in the right maxillary sinus.

The osseous structures are unremarkable.
IMPRESSION: No acute intracranial process. CT can underestimate ischemia in the first 24
hours after the event. If there is clinical concern for an acute infarct, a
followup MRI or repeat CT scan in 24 hours may provide additional
information.

## 2011-06-20 IMAGING — CR DG CHEST 1V PORT
1 series · 1 of 1 positions shown · non-contrast
Comparison: none

REASON FOR EXAM: Intubation
COMMENTS:

[view not recorded]
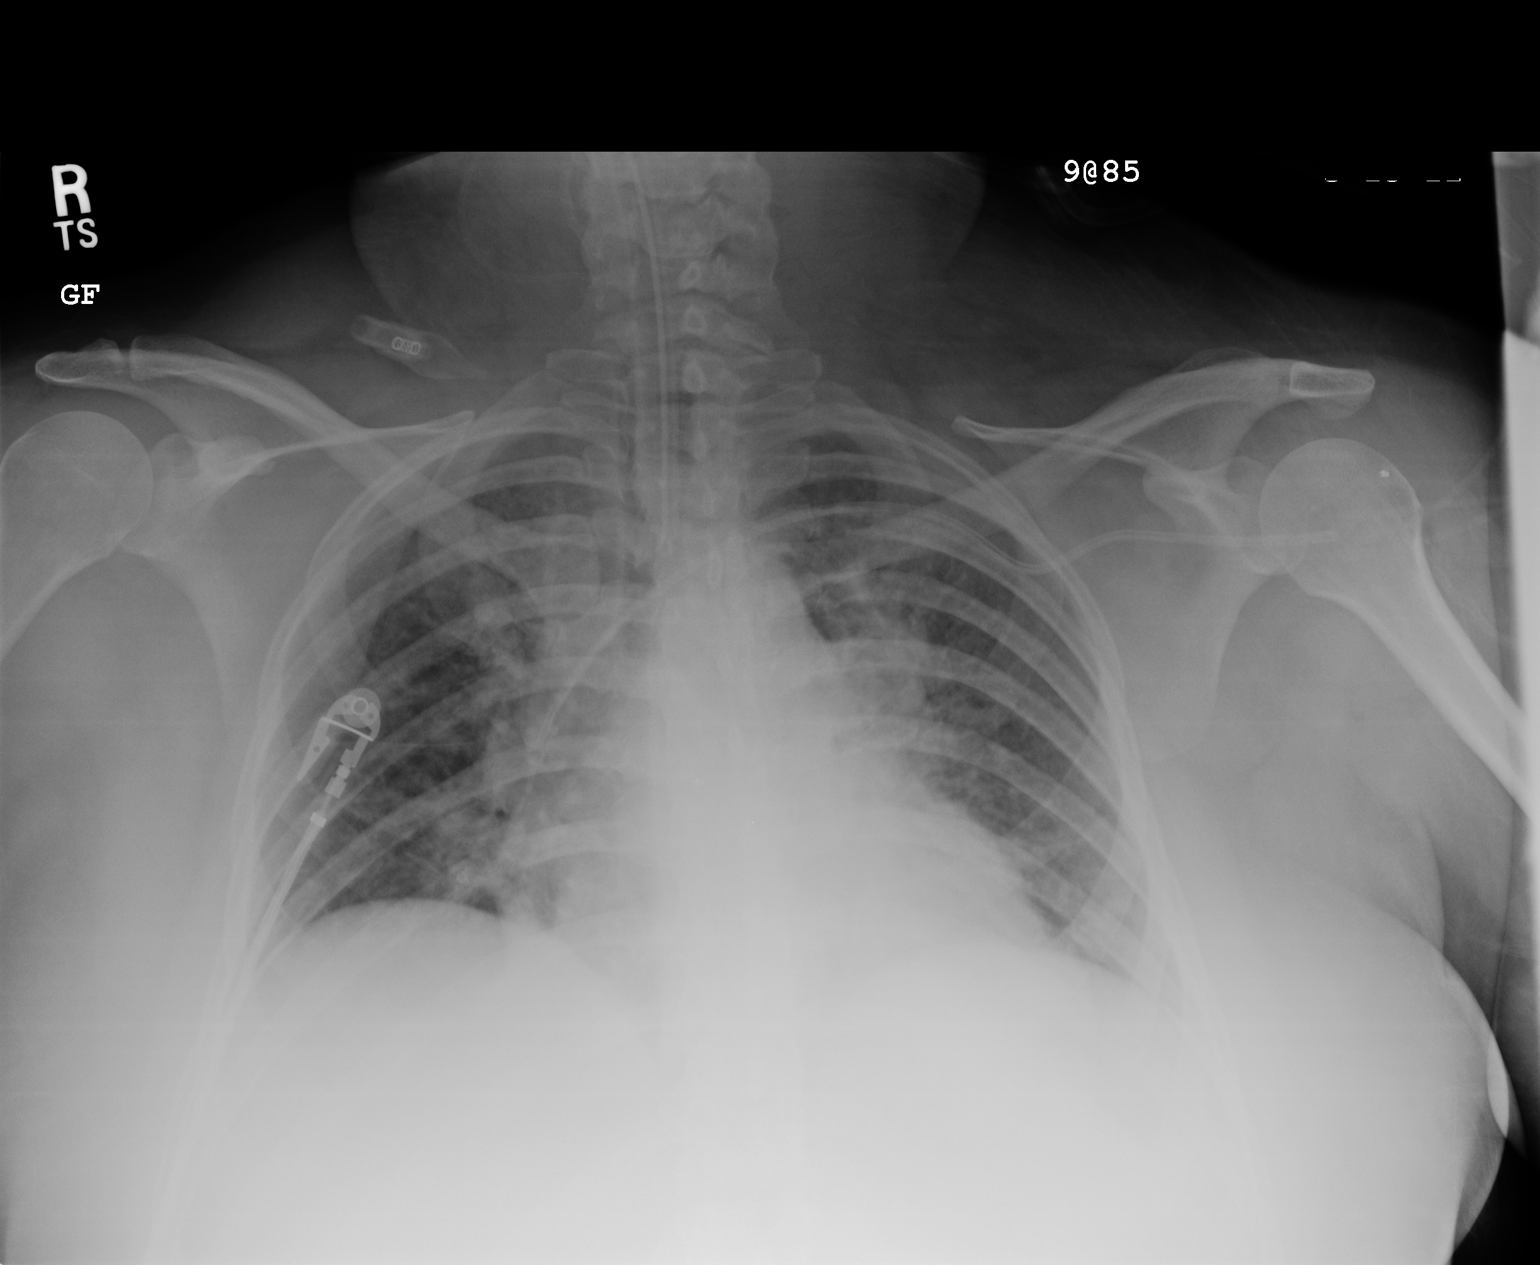

[1 of 1 positions shown; findings below may reference images not displayed]

PROCEDURE:     DXR - DXR PORTABLE CHEST SINGLE VIEW  - November 16, 2009 [DATE]

RESULT:     Comparison is made to a prior exam of earlier this date.

A venous catheter remains present with the tip projected over the inferior
aspect of the superior vena cava. An endotracheal tube is now present with
the tip approximately 2 cm above the carina. No pneumothorax or pleural
effusion is seen. There is again noted prominence of the pulmonary
vascularity suspicious for pulmonary vascular congestion. There is slight
atelectasis at the right lung base.
IMPRESSION: Please see above.

## 2011-06-20 IMAGING — CT CT CHEST-ABD-PELV W/O CM
1 of 2 series · 15 of 31 positions shown, 19 images · non-contrast
Comparison: None

REASON FOR EXAM: (1) enlarged  cor on cxr; (2) no urine out put    AMs
hypotension
COMMENTS:

PROCEDURE:     CT  - CT CHEST ABDOMEN AND PELVIS WO  - November 16, 2009  [DATE]
RESULT:     CT CHEST, ABDOMEN, AND PELVIS
HISTORY: Altered mental status, hypotension
TECHNIQUE: Multiple axial images obtained from the thoracic inlet to the
pubic symphysis, without p.o. contrast and without intravenous contrast.

[Series 2: soft tissue · axial · 0.87mm/px · z∈[-898,-328]mm · 15 of 128 slices shown, 19 images]
[im 7/128  mediastinal]
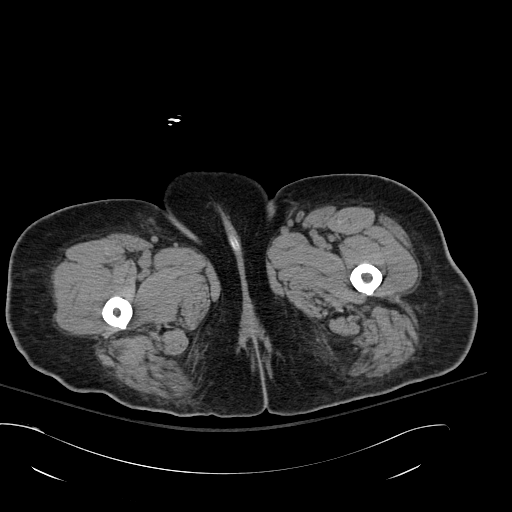
[im 7/128  bone]
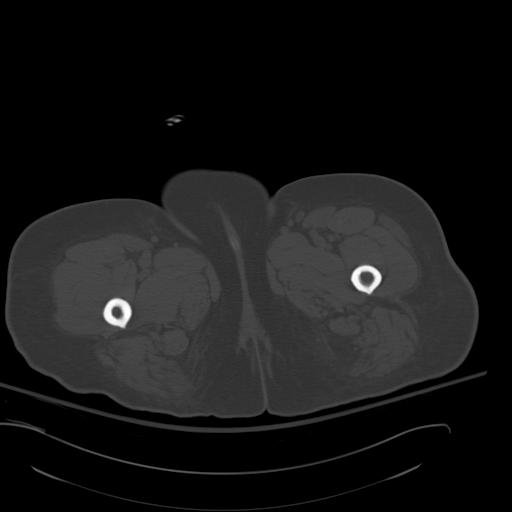
[im 20/128  mediastinal]
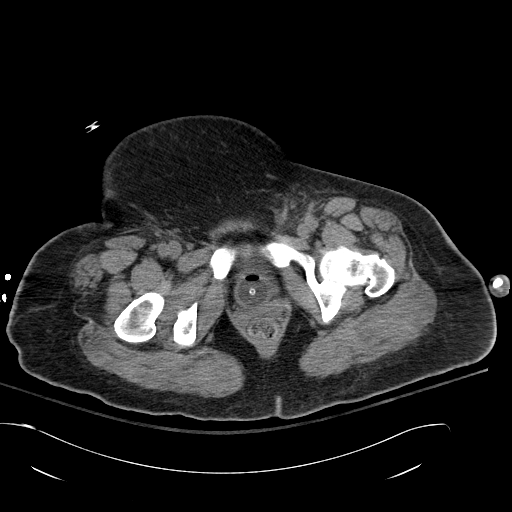
[im 32/128  mediastinal]
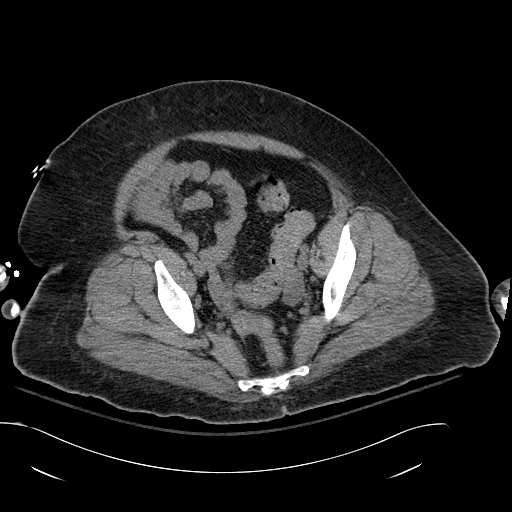
[im 39/128  mediastinal]
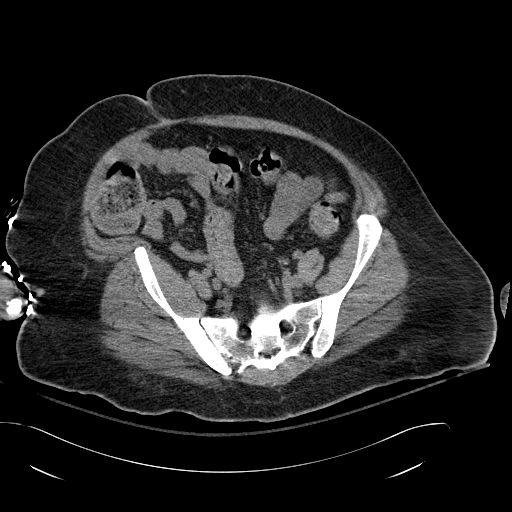
[im 45/128  mediastinal]
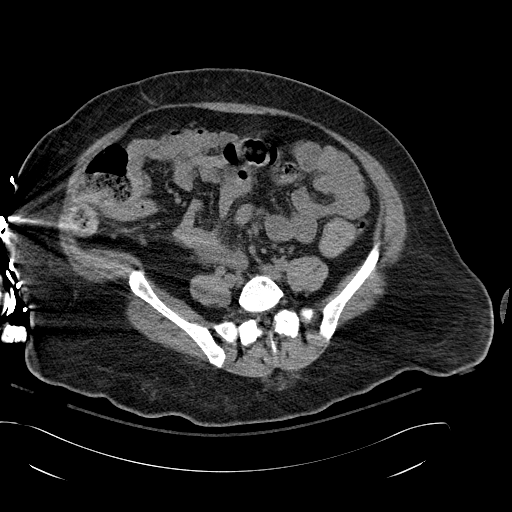
[im 58/128  mediastinal]
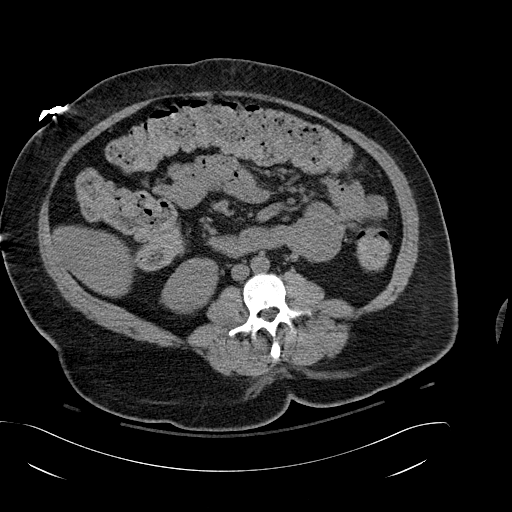
[im 64/128  mediastinal]
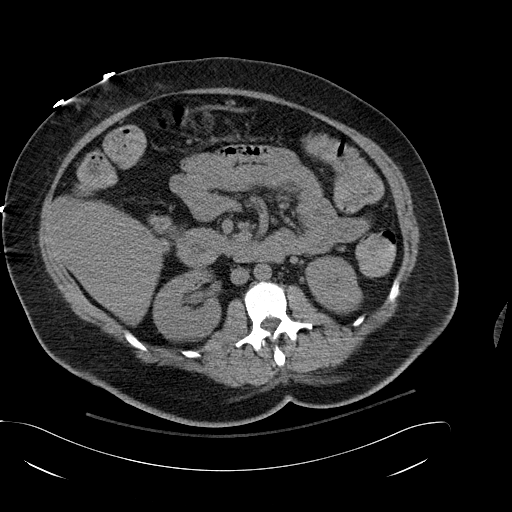
[im 70/128  mediastinal]
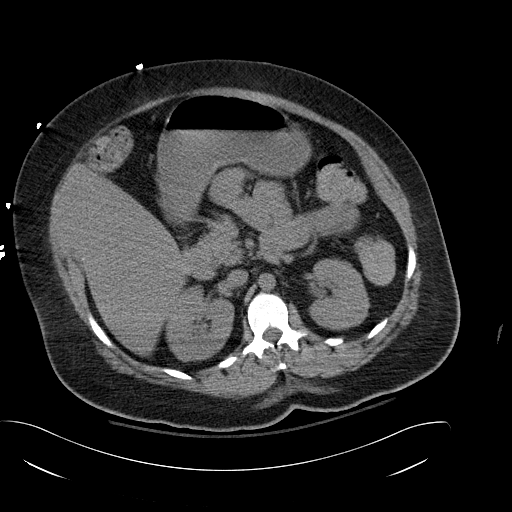
[im 83/128  mediastinal]
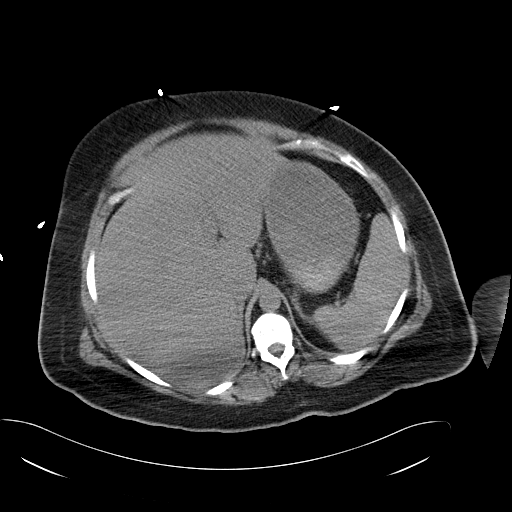
[im 83/128  bone]
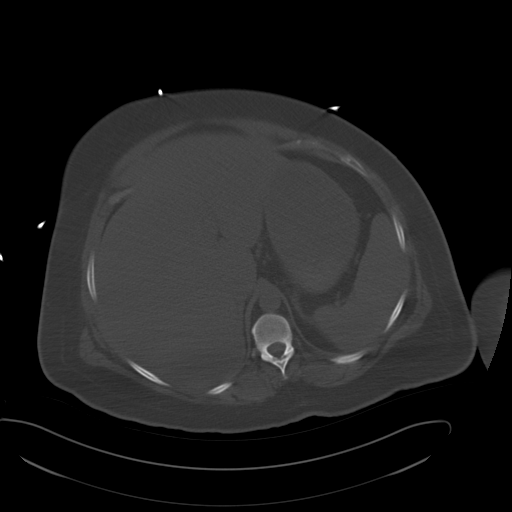
[im 89/128  mediastinal]
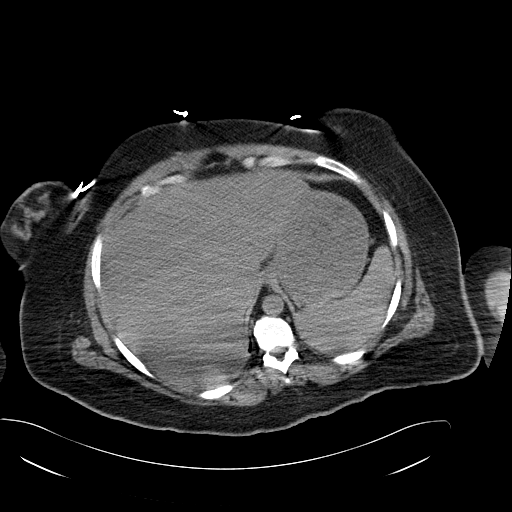
[im 96/128  mediastinal]
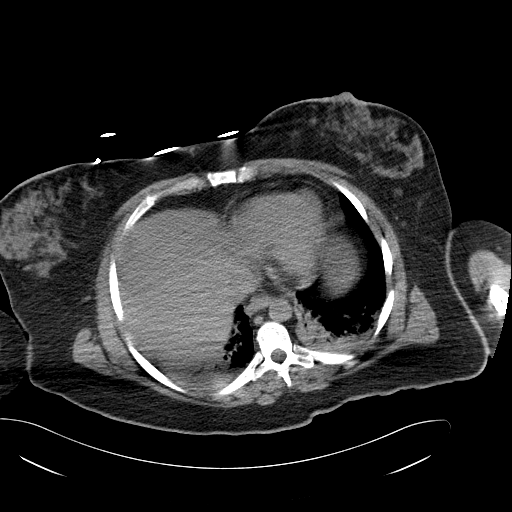
[im 102/128  lung]
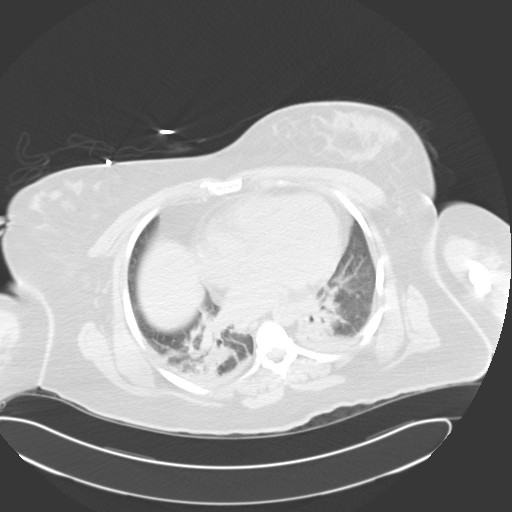
[im 108/128  mediastinal]
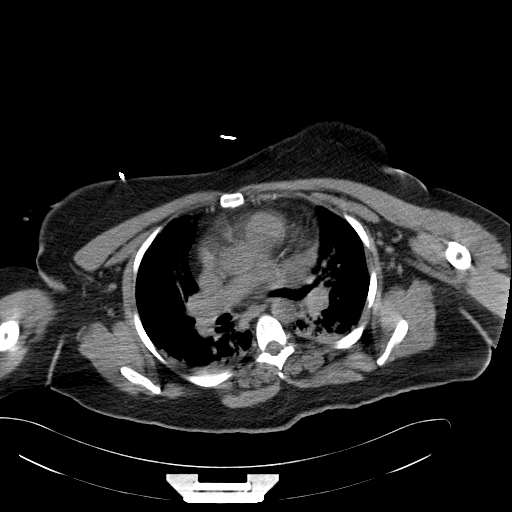
[im 108/128  lung]
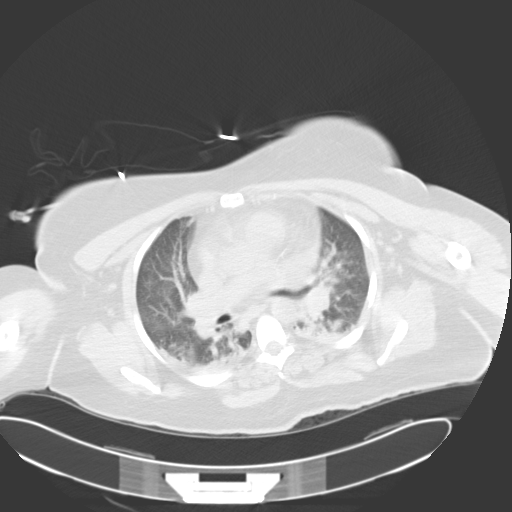
[im 115/128  lung]
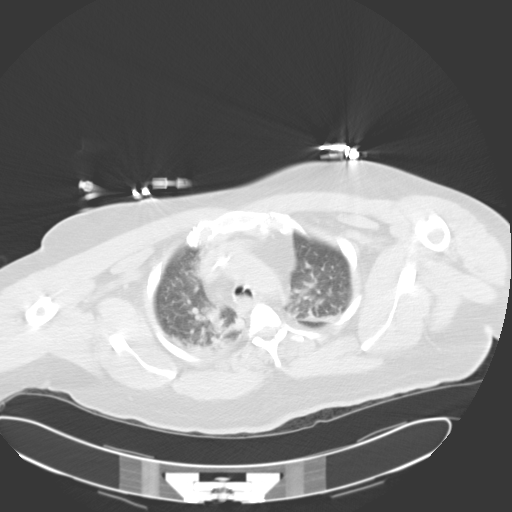
[im 121/128  mediastinal]
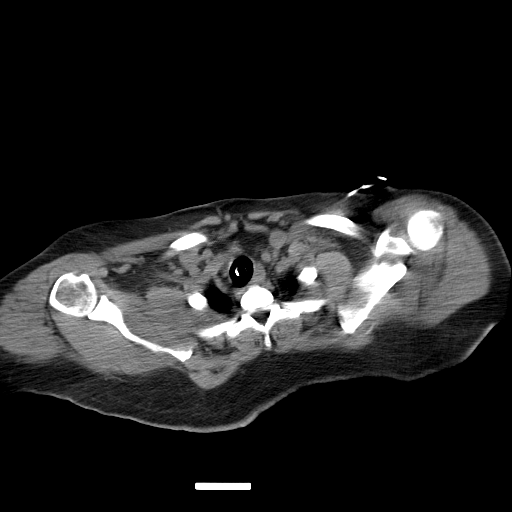
[im 121/128  lung]
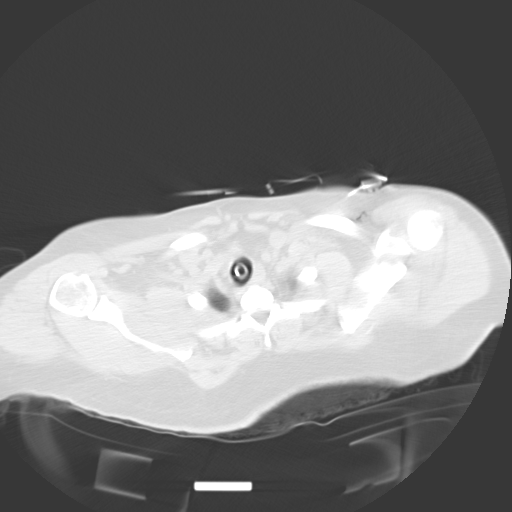

[15 of 31 positions shown; findings below may reference images not displayed]

FINDINGS: CHEST:

There is an endotracheal tube with the tip approximately 1 cm above the
carina. There is bilateral basilar airspace disease which may represent
atelectasis versus infiltrate. There are trace bilateral pleural effusions.
There is bilateral hilar lymphadenopathy.

The heart size is enlarged. There is no pericardial effusion.

The osseous structures demonstrate no focal abnormality.

ABDOMEN/PELVIS:

The liver demonstrates no focal abnormality. There is no intrahepatic or
extrahepatic biliary ductal dilatation. The gallbladder is surgically
absent. The spleen demonstrates no focal abnormality. The kidneys, adrenal
glands, pancreas are normal. The bladder is unremarkable.

The unopacified stomach, duodenum, small intestine, and large intestine
demonstrate no gross abnormality, but evaluation is limited secondary to
lack of enteric contrast. There is no pneumoperitoneum, pneumatosis, or
portal venous gas. There is no abdominal or pelvic free fluid. There is no
lymphadenopathy.

The abdominal aorta is normal in caliber .

The osseous structures are unremarkable.
IMPRESSION: Bibasilar airspace disease with small pleural effusions may represent
atelectasis versus infiltrate. There is bilateral hilar lymphadenopathy
which may be reactive. Recommend followup radiography to document complete
resolution following adequate medical therapy.

Cardiomegaly.

## 2011-06-22 ENCOUNTER — Emergency Department: Payer: Self-pay | Admitting: Emergency Medicine

## 2011-06-24 IMAGING — CT CT CHEST W/ CM
1 series · 16 of 32 positions shown, 20 images · IV contrast (APPLIED)
Comparison: none

REASON FOR EXAM: pe
COMMENTS:

PROCEDURE:     CT  - CT CHEST (FOR PE) W  - November 20, 2009 [DATE]
RESULT:     History: Pulmonary most.
Comparison Study: CT of 11/16/2009.

[Series 4: soft tissue · axial · 0.68mm/px · z∈[-130,+134]mm · 16 of 96 slices shown, 20 images]
[im 4/96  mediastinal]
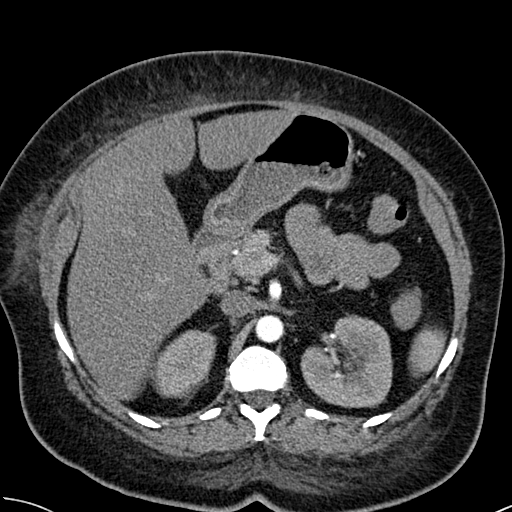
[im 4/96  lung]
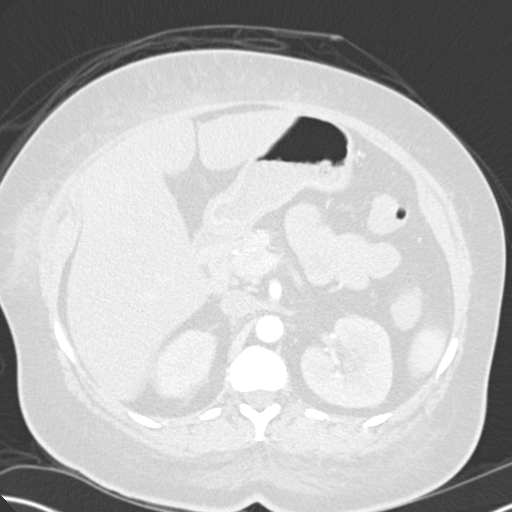
[im 11/96  lung]
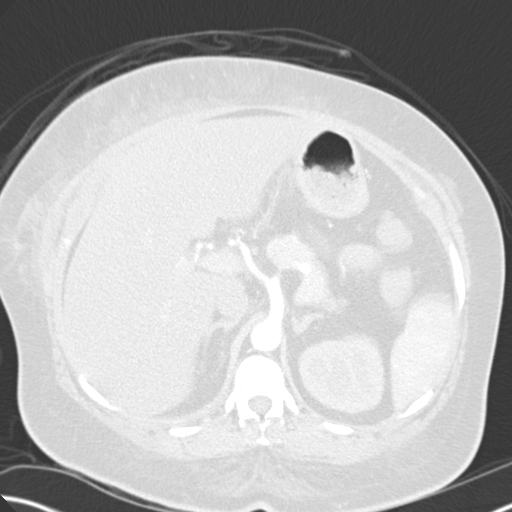
[im 18/96  lung]
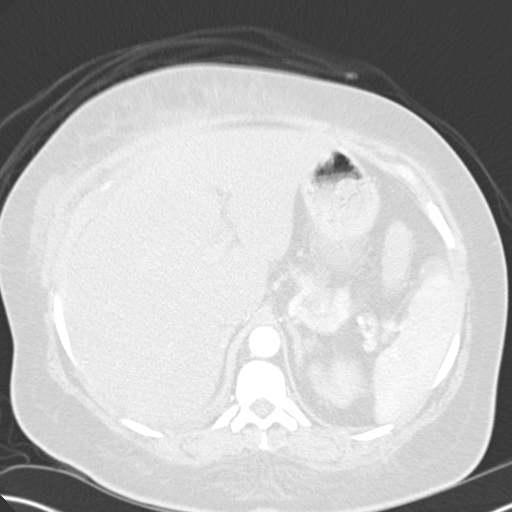
[im 22/96  lung]
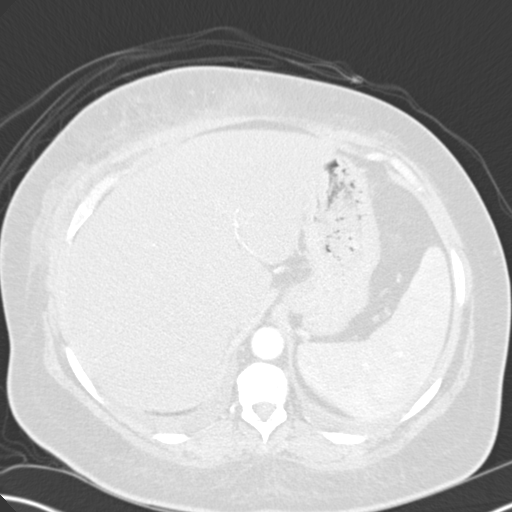
[im 29/96  mediastinal]
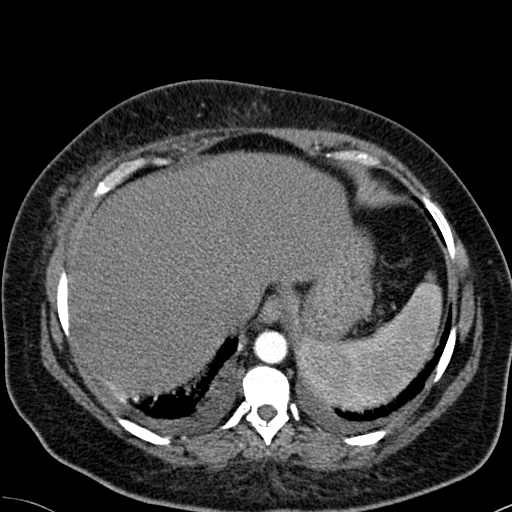
[im 29/96  lung]
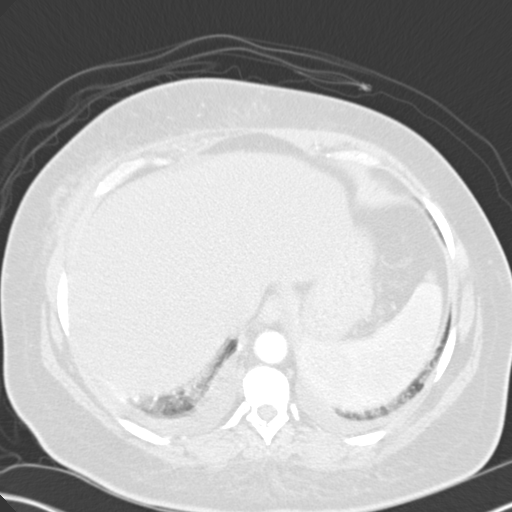
[im 36/96  lung]
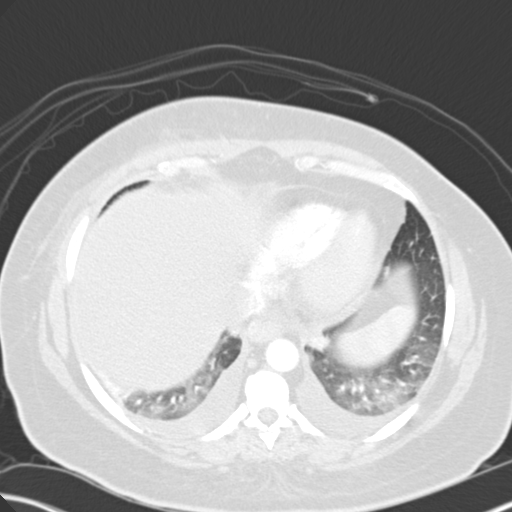
[im 43/96  lung]
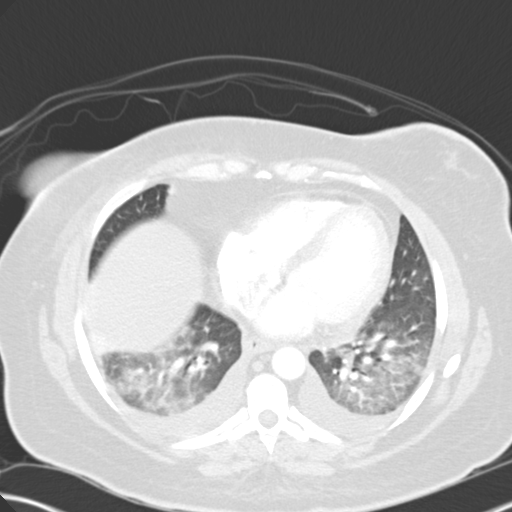
[im 50/96  lung]
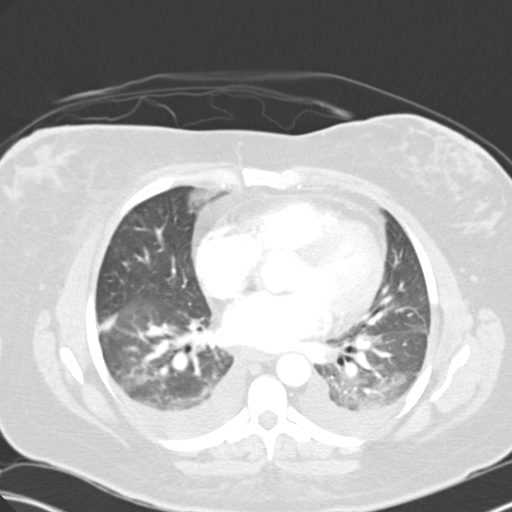
[im 51/96  mediastinal]
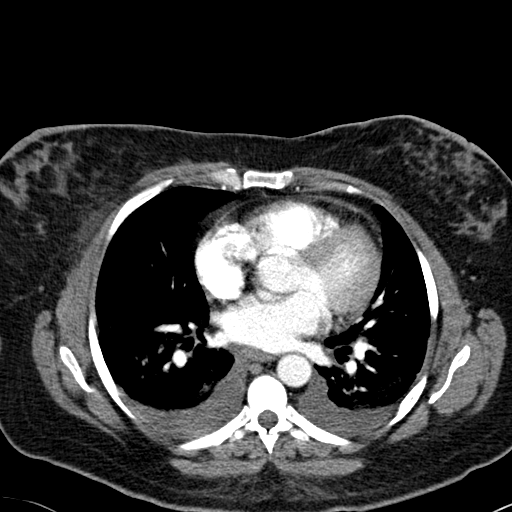
[im 51/96  lung]
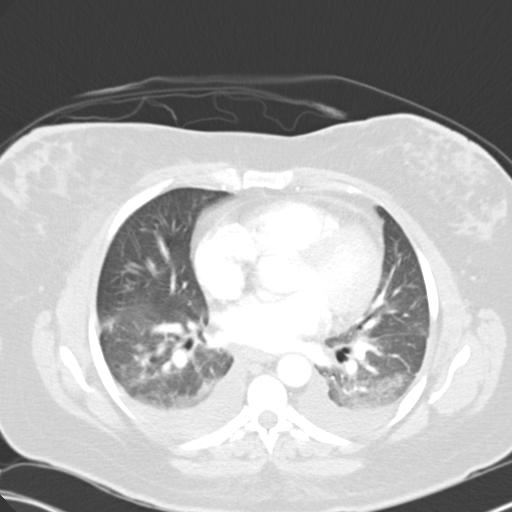
[im 57/96  lung]
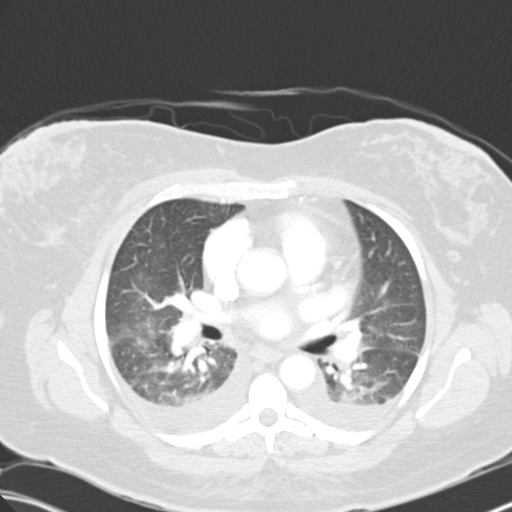
[im 60/96  lung]
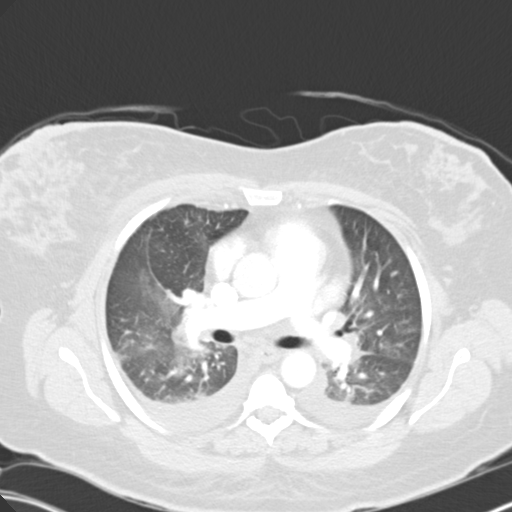
[im 67/96  lung]
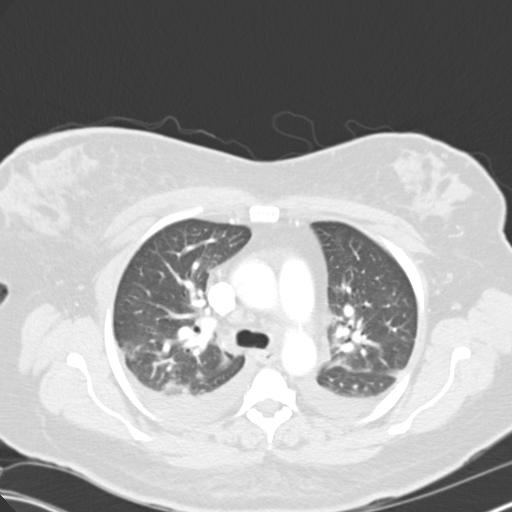
[im 74/96  mediastinal]
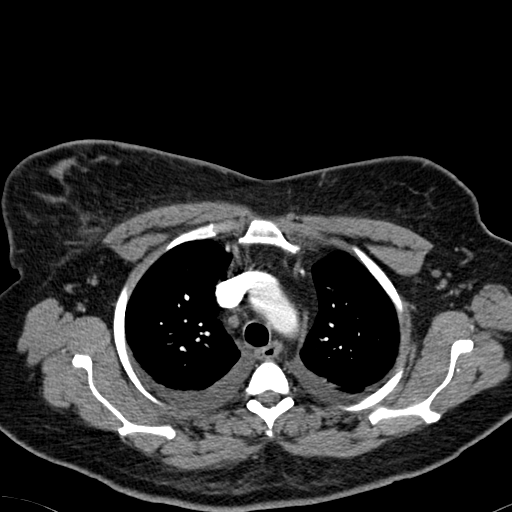
[im 74/96  lung]
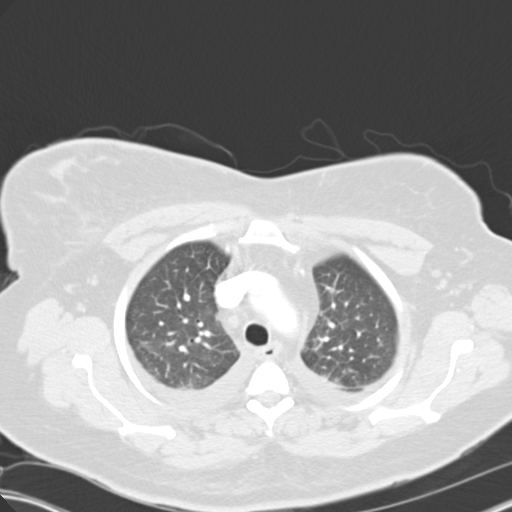
[im 78/96  lung]
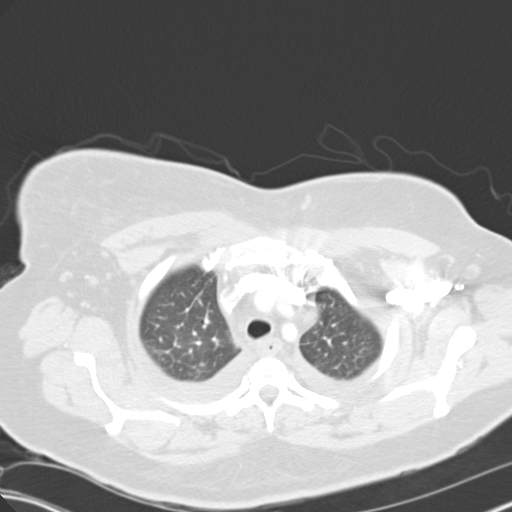
[im 85/96  lung]
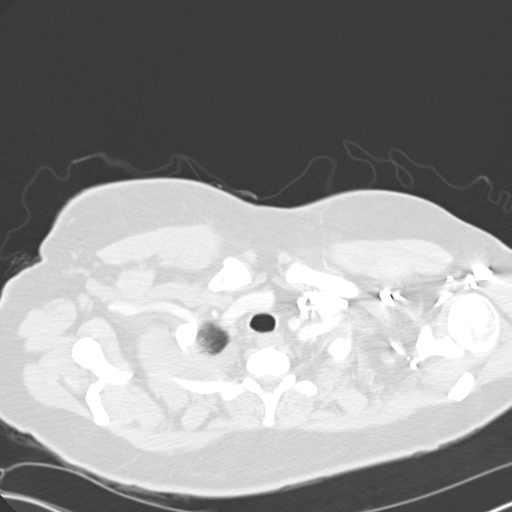
[im 92/96  lung]
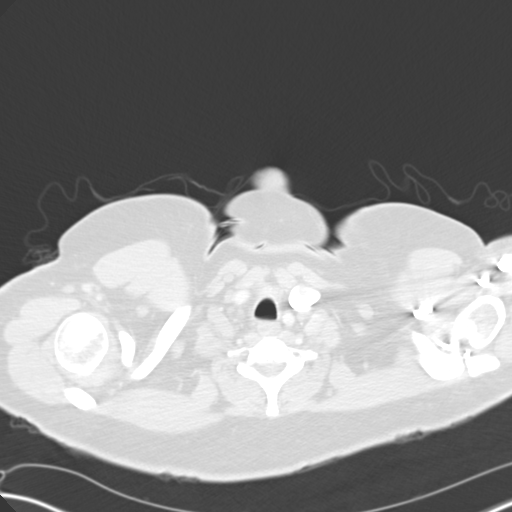

[16 of 32 positions shown; findings below may reference images not displayed]

FINDINGS: Following administration of 100 cc of [id number] CT obtained.
Thoracic aorta unremarkable. Pulmonary arteries are normal. Bilateral
pleural effusions are present. Bilateral pulmonary interstitial edema noted.
These signs are consistent congestive heart failure pulmonary edema.
IMPRESSION: Congestive heart failure with bilateral pulmonary edema and
pleural effusions. Superimposed pneumonia cannot be excluded. No pulmonary
embolus.

## 2011-06-24 IMAGING — CR DG CHEST 2V
1 series · 2 of 2 positions shown · non-contrast
Comparison: none

REASON FOR EXAM: hypoxia
COMMENTS:

[Series 1: view not recorded · 0.17mm/px · 2 of 2 slices shown]
[im 1/2]
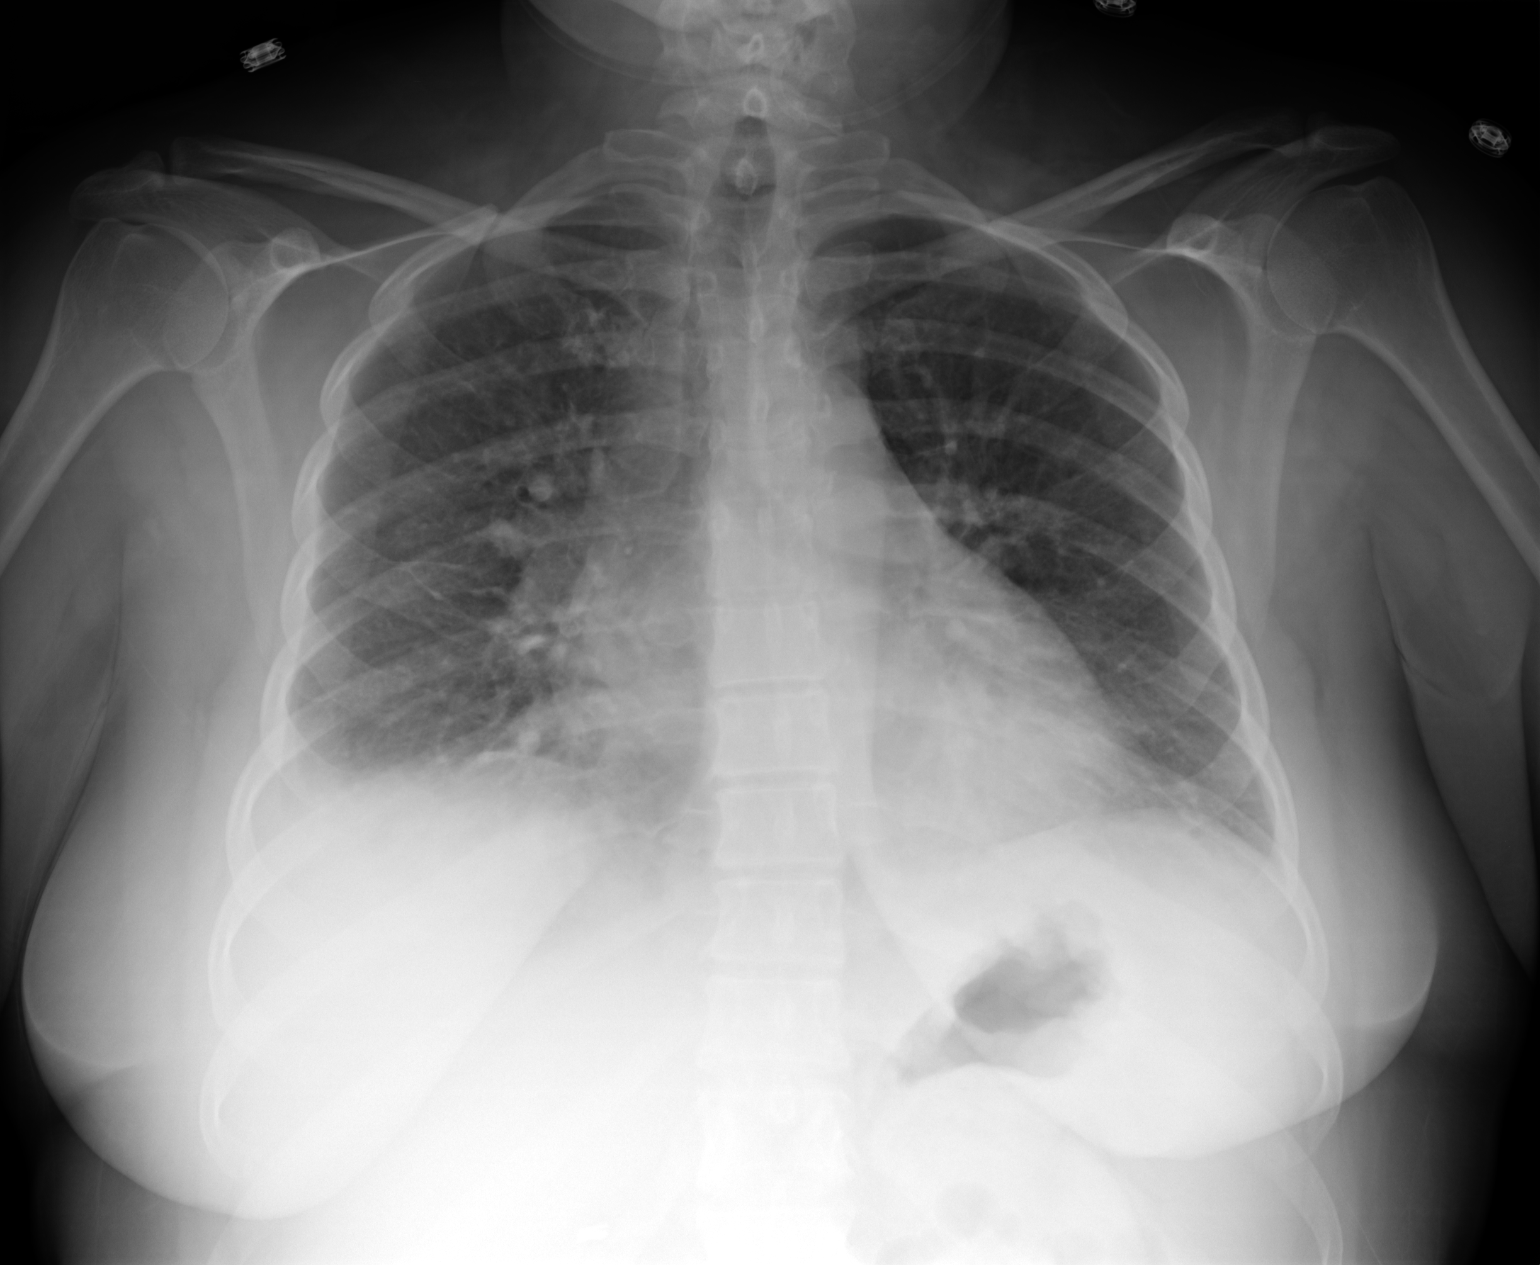
[im 2/2]
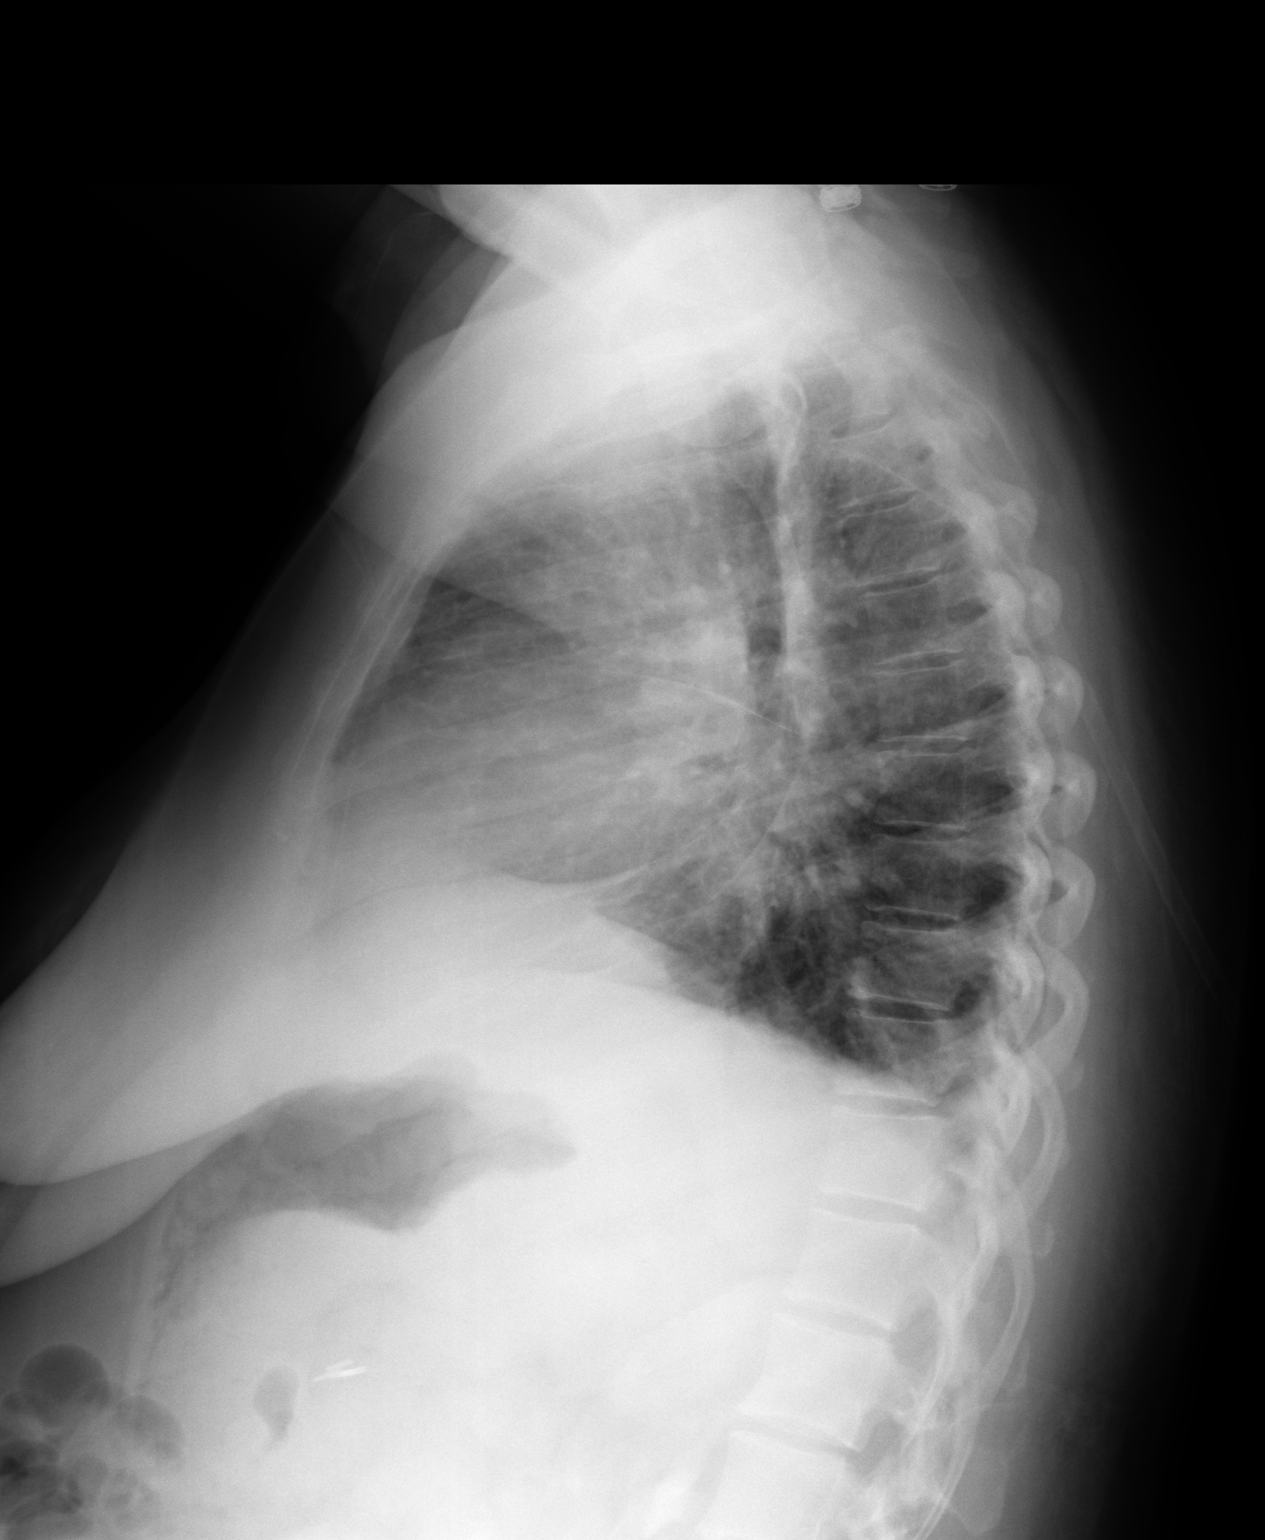

[2 of 2 positions shown; findings below may reference images not displayed]

PROCEDURE:     DXR - DXR CHEST PA (OR AP) AND LATERAL  - November 20, 2009 [DATE]

RESULT:     Cardiomegaly with pulmonary venous congestion, bilateral
interstitial prominence, and bilateral pleural effusions are noted
consistent with congestive heart failure. Superimposed pneumonia cannot be
excluded. The patient has been extubated. The central line has been removed.
IMPRESSION: 1. Mild changes of congestive heart failure and pulmonary edema with
bilateral small pleural effusions.
2. Interval removal of endotracheal tube and central venous line.

## 2011-07-09 ENCOUNTER — Emergency Department: Payer: Self-pay | Admitting: Internal Medicine

## 2011-07-27 IMAGING — US TRANSABDOMINAL ULTRASOUND OF PELVIS
1 series · 17 of 25 positions shown · non-contrast
Comparison: none

REASON FOR EXAM: pelvic pain
COMMENTS:

[Series 1: transabdominal ultrasound of pelvis · 17 of 35 slices shown]
[im 1/35]
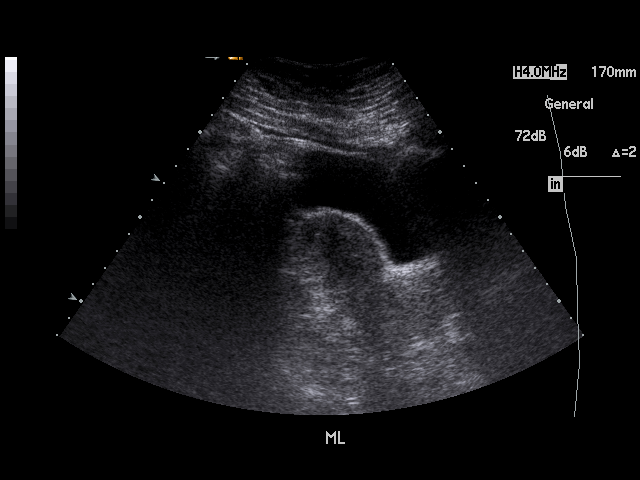
[im 3/35]
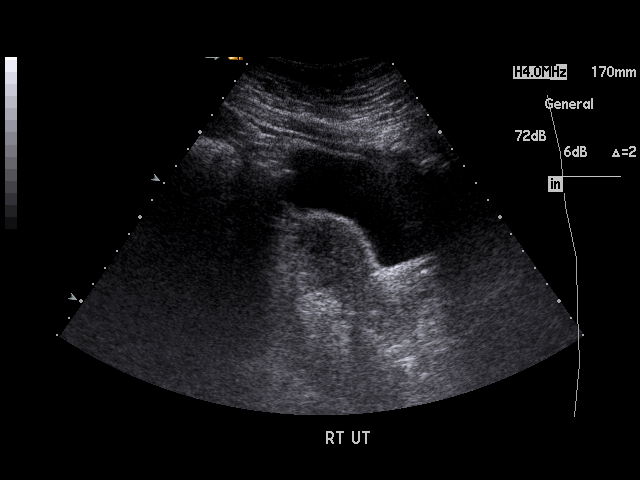
[im 5/35]
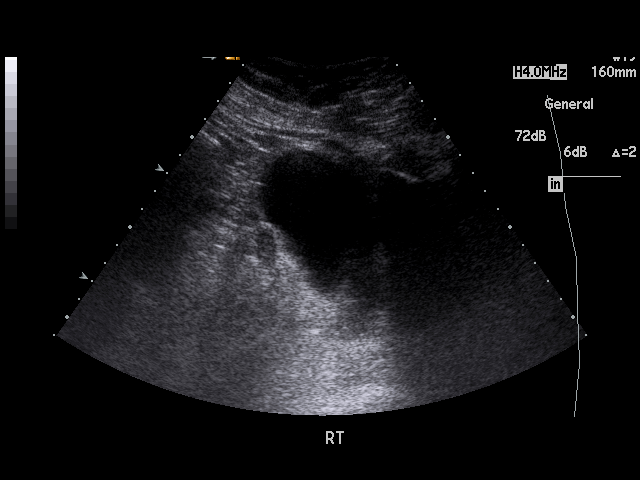
[im 8/35]
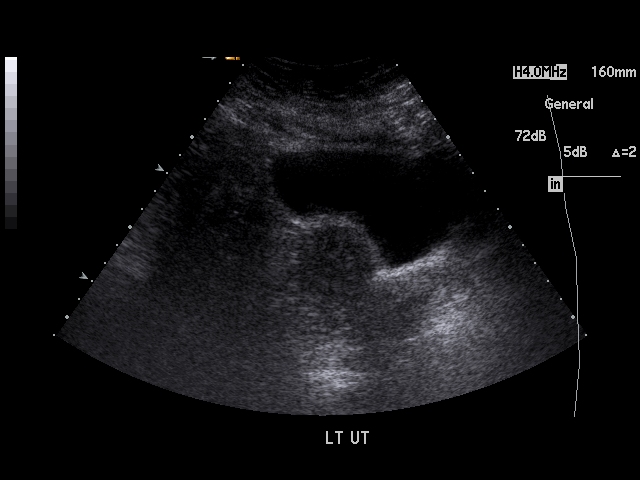
[im 9/35]
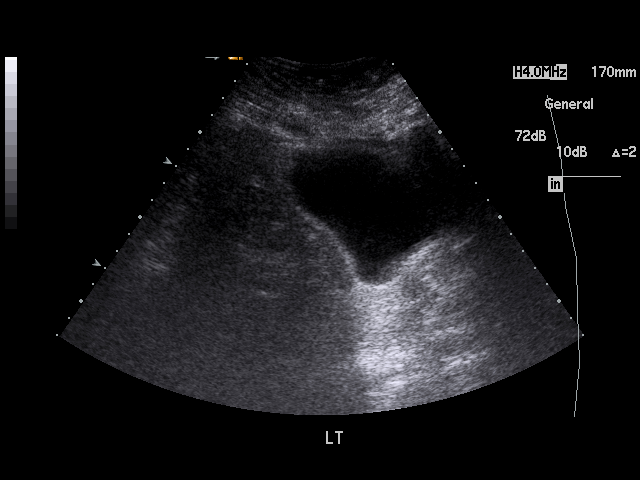
[im 12/35]
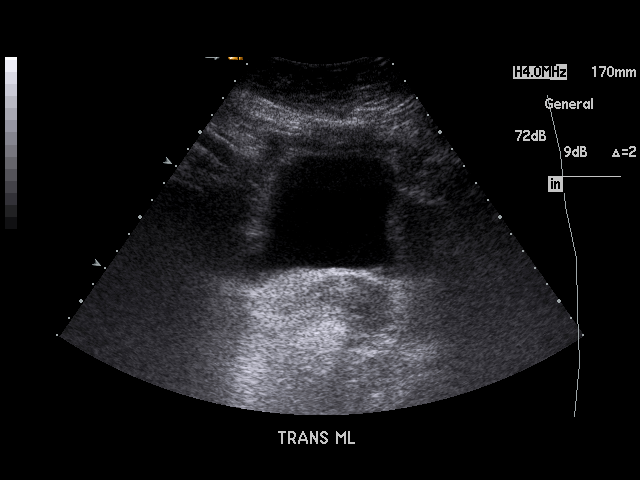
[im 13/35]
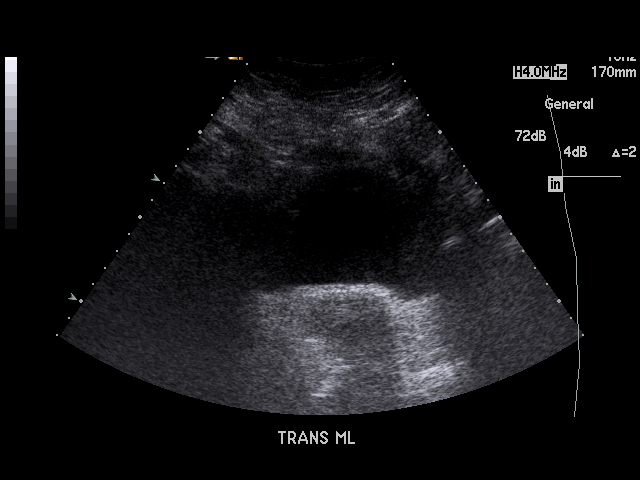
[im 16/35]
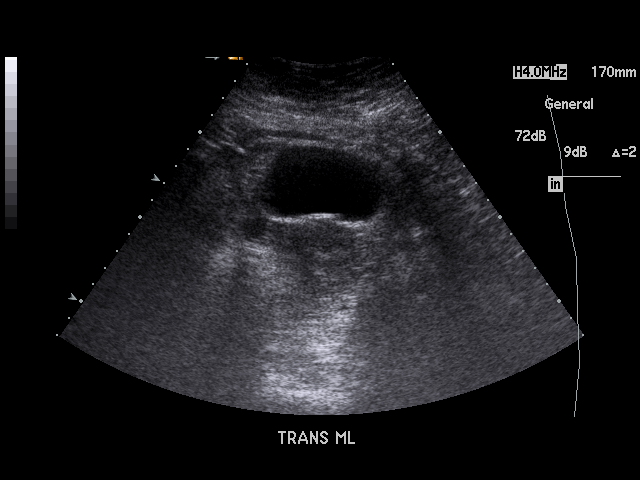
[im 18/35]
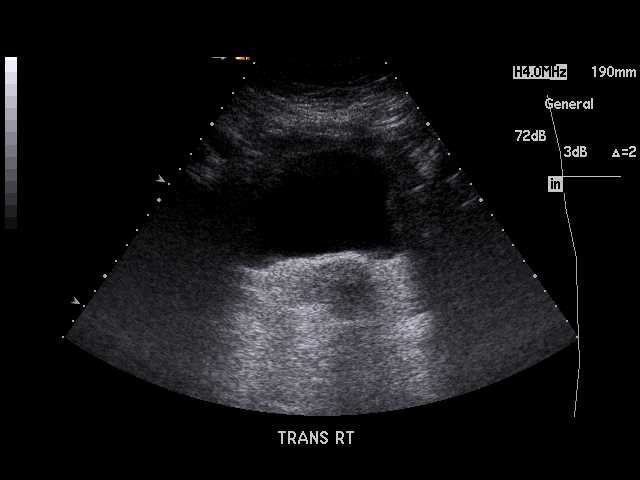
[im 19/35]
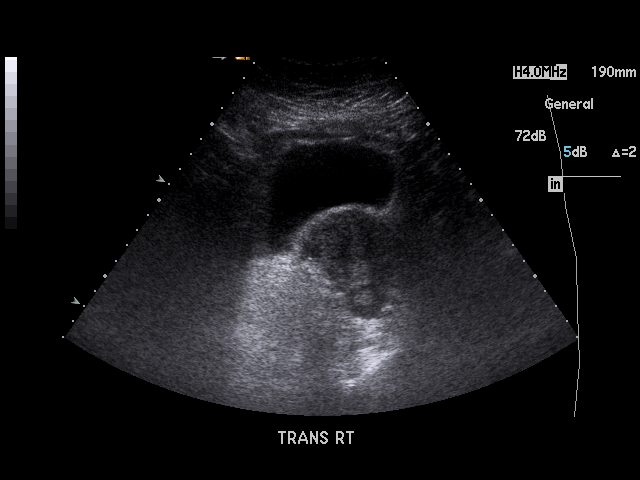
[im 22/35]
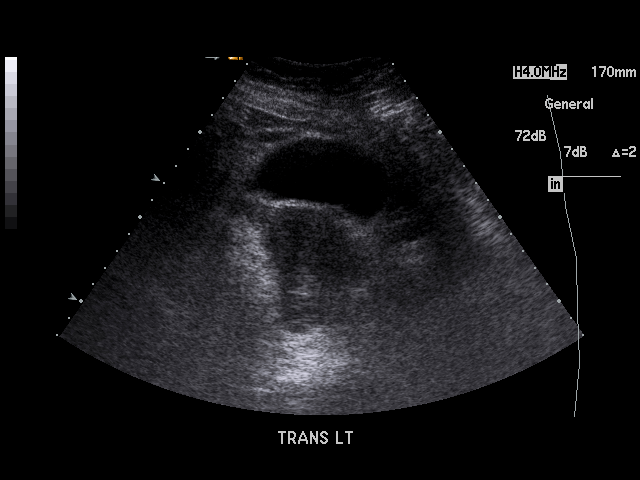
[im 23/35]
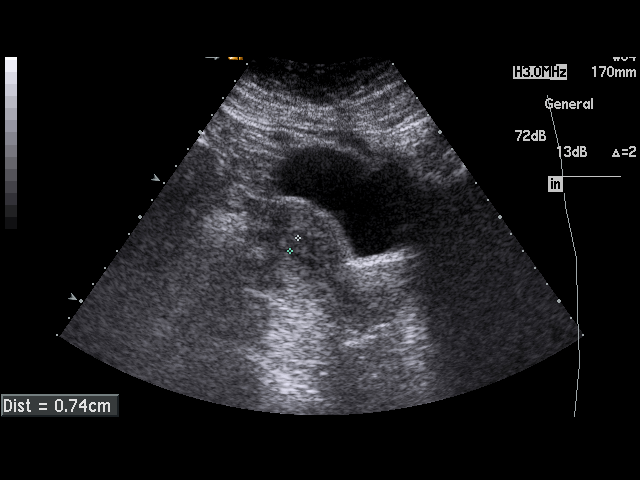
[im 26/35]
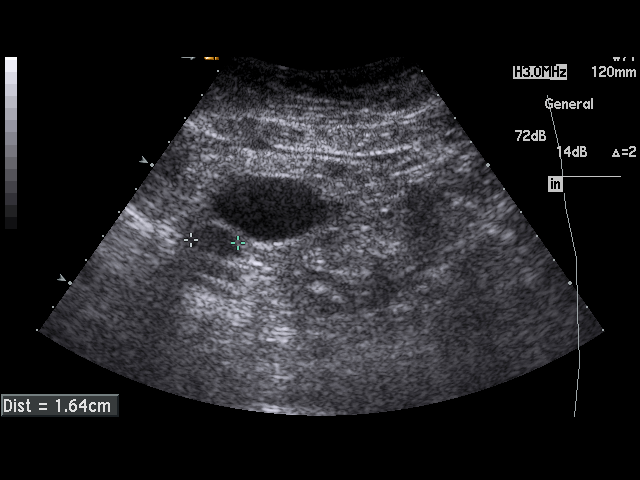
[im 27/35]
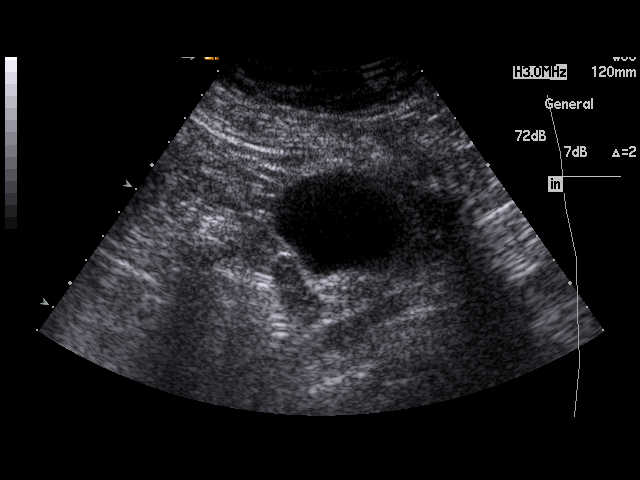
[im 30/35]
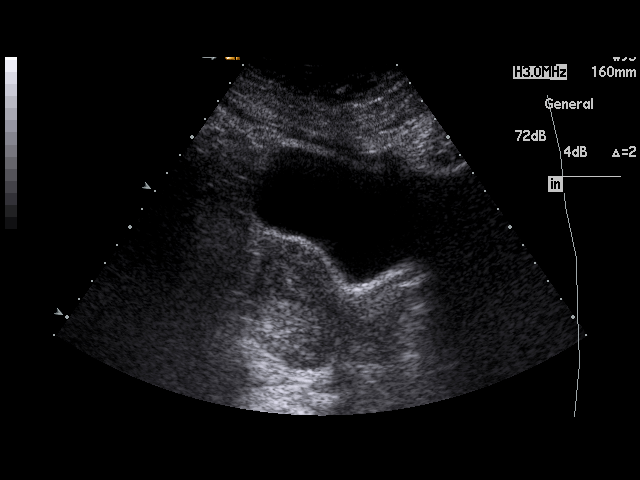
[im 32/35]
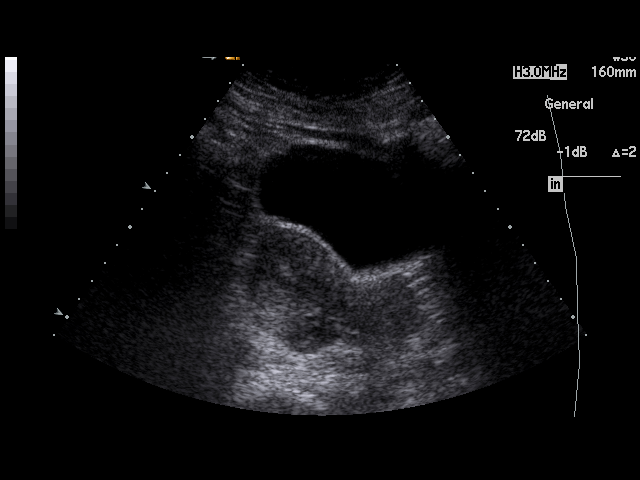
[im 35/35]
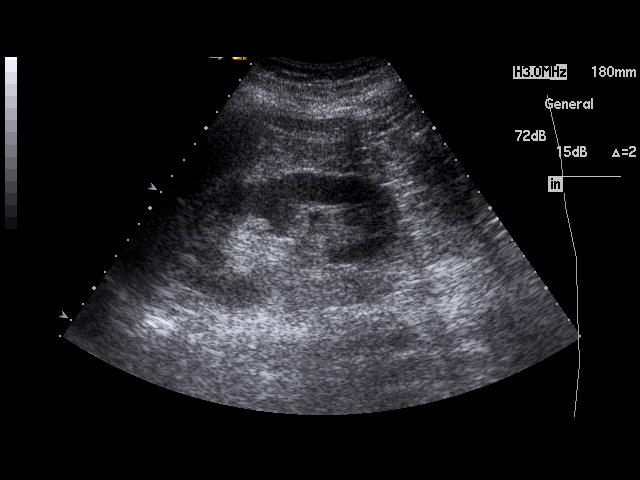

[17 of 25 positions shown; findings below may reference images not displayed]

PROCEDURE:     US  - US PELVIS EXAM  - December 23, 2009  [DATE]

RESULT:     Transabdominal pelvic ultrasound was performed. The uterus
measures 7.64 cm x 4.17 cm x 3.92 cm. No uterine mass lesions are seen. The
endometrium measures 7.4 mm in thickness. The right and left ovaries are
visualized. The right ovary measures 2.51 cm at maximum diameter and the
left ovary measures 3.22 cm at maximum diameter. No abnormal adnexal masses
are seen. There is no free fluid in the pelvis. The visualized portion of
the urinary bladder is normal in appearance. The kidneys show no
hydronephrosis.
IMPRESSION: No significant abnormalities are noted.

## 2011-08-03 ENCOUNTER — Emergency Department: Payer: Self-pay | Admitting: Emergency Medicine

## 2011-08-07 IMAGING — CR DG KNEE COMPLETE 4+V*L*
1 series · 4 of 4 positions shown · non-contrast
Comparison: none

REASON FOR EXAM: pain/edema
COMMENTS:   May transport without cardiac monitor

PROCEDURE:     DXR - DXR KNEE LT COMP WITH OBLIQUES  - January 03, 2010 [DATE]
RESULT:     Images of the left knee demonstrate no fracture, dislocation or
radiopaque foreign body.

[Series 1: view not recorded · 0.17mm/px · 4 of 4 slices shown]
[im 1/4]
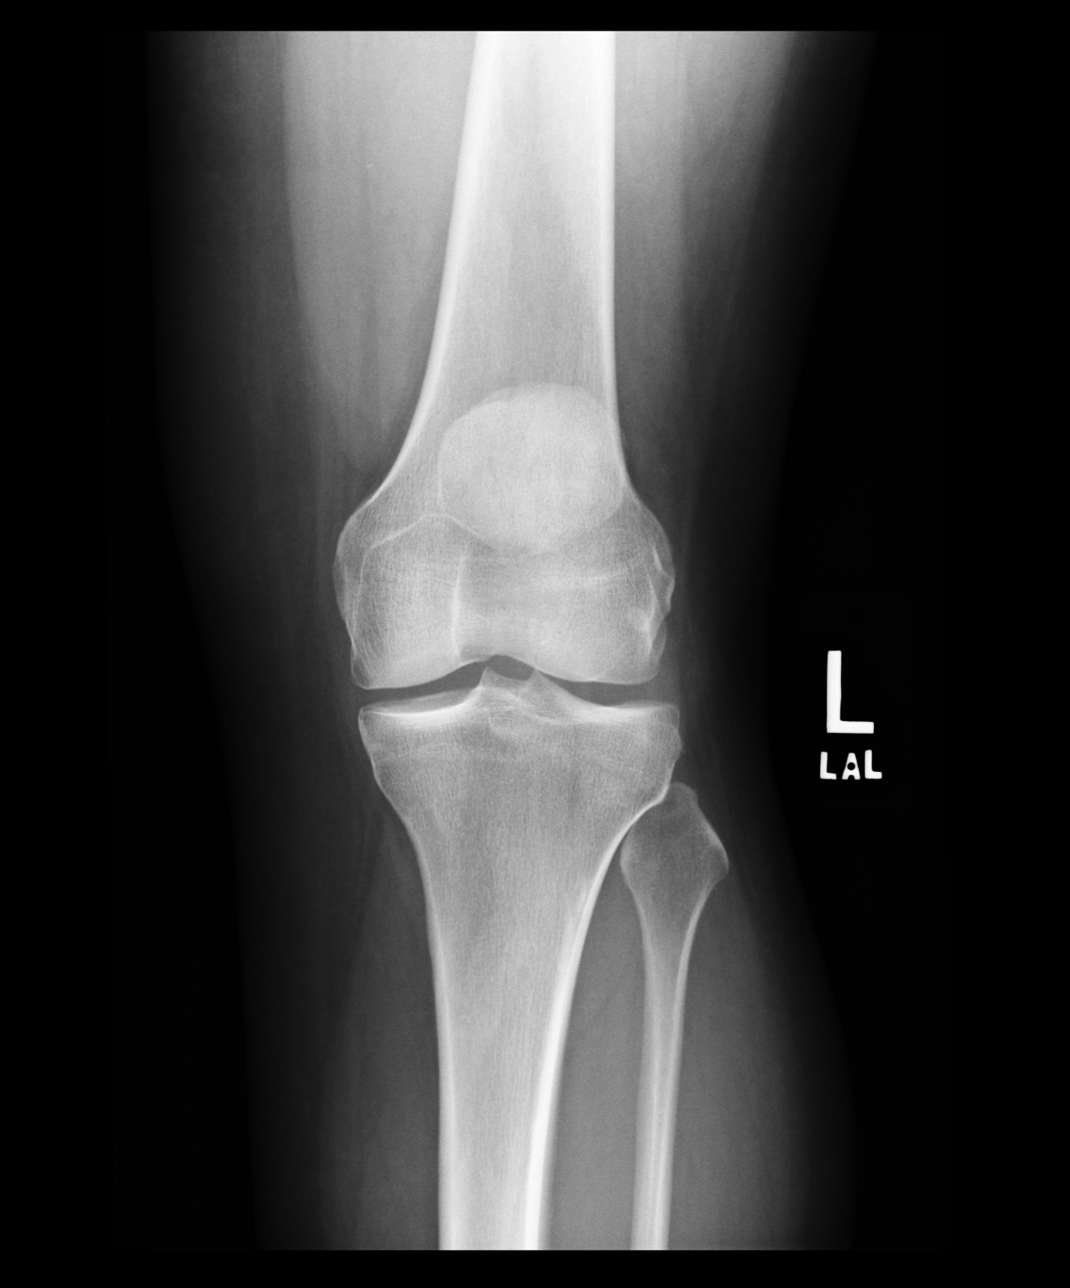
[im 2/4]
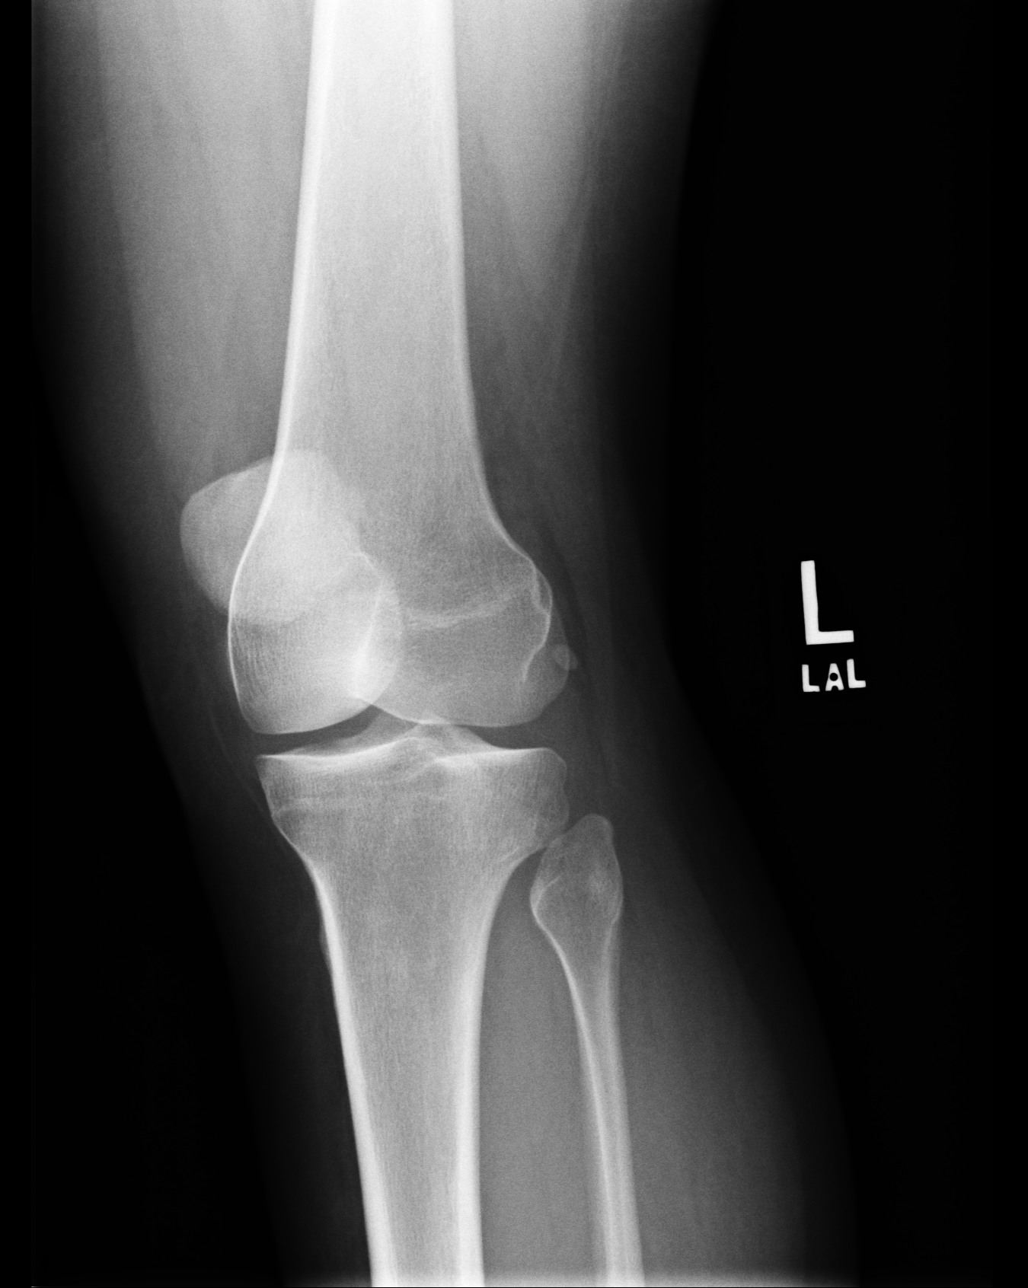
[im 3/4]
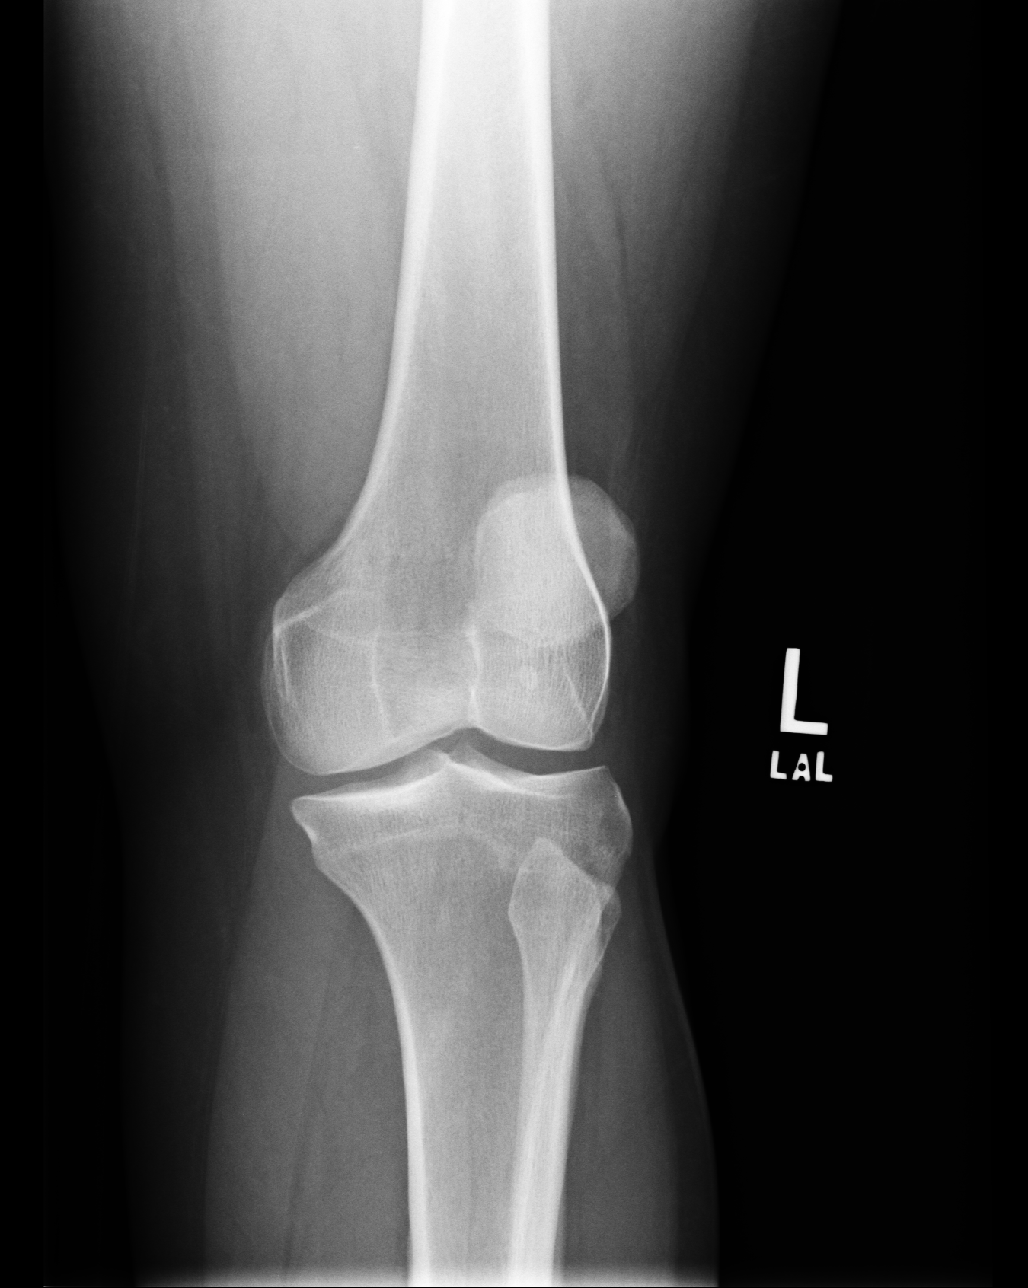
[im 4/4]
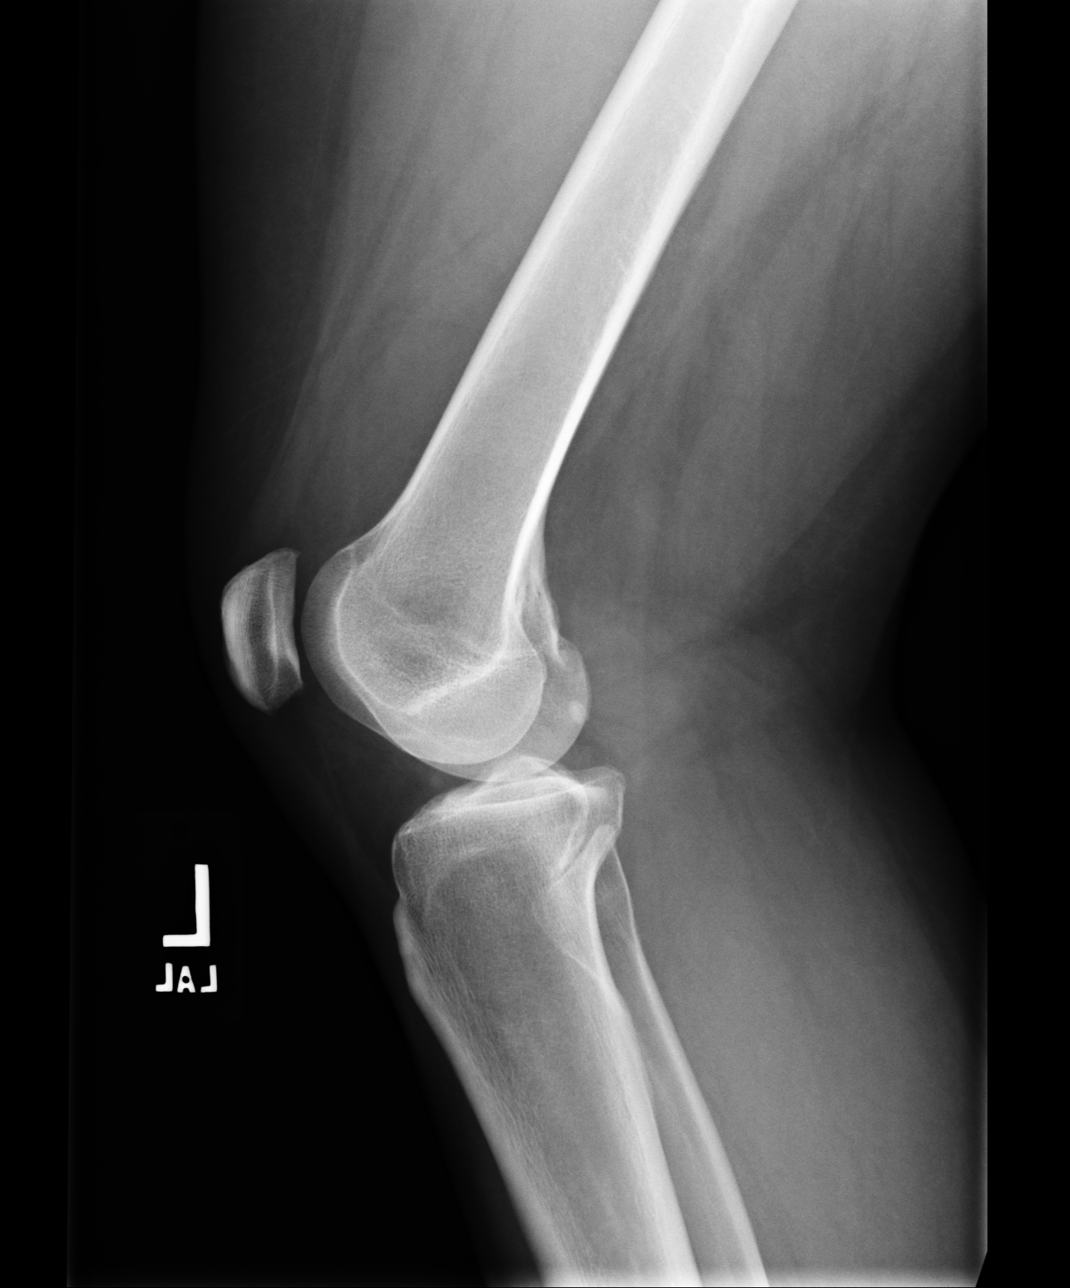

[4 of 4 positions shown; findings below may reference images not displayed]

IMPRESSION: Please see above.

## 2011-09-18 ENCOUNTER — Inpatient Hospital Stay: Payer: Self-pay | Admitting: Internal Medicine

## 2011-09-18 LAB — COMPREHENSIVE METABOLIC PANEL
Albumin: 2.9 g/dL — ABNORMAL LOW (ref 3.4–5.0)
Alkaline Phosphatase: 37 U/L — ABNORMAL LOW (ref 50–136)
Anion Gap: 12 (ref 7–16)
Bilirubin,Total: 0.4 mg/dL (ref 0.2–1.0)
Calcium, Total: 8.5 mg/dL (ref 8.5–10.1)
Glucose: 223 mg/dL — ABNORMAL HIGH (ref 65–99)
Osmolality: 290 (ref 275–301)
Potassium: 4 mmol/L (ref 3.5–5.1)
SGPT (ALT): 39 U/L
Sodium: 139 mmol/L (ref 136–145)
Total Protein: 6.7 g/dL (ref 6.4–8.2)

## 2011-09-18 LAB — URINALYSIS, COMPLETE
Bilirubin,UR: NEGATIVE
Granular Cast: 16
Ketone: NEGATIVE
Ph: 5 (ref 4.5–8.0)
Protein: 30
Specific Gravity: 1.018 (ref 1.003–1.030)
Squamous Epithelial: 2
WBC UR: 8 /HPF (ref 0–5)

## 2011-09-18 LAB — CBC
HCT: 32.5 % — ABNORMAL LOW (ref 35.0–47.0)
HGB: 10.9 g/dL — ABNORMAL LOW (ref 12.0–16.0)
MCHC: 33.5 g/dL (ref 32.0–36.0)
RBC: 3.52 10*6/uL — ABNORMAL LOW (ref 3.80–5.20)
RDW: 14.7 % — ABNORMAL HIGH (ref 11.5–14.5)
WBC: 6.1 10*3/uL (ref 3.6–11.0)

## 2011-09-18 LAB — TROPONIN I: Troponin-I: <0.02 ng/mL

## 2011-09-19 LAB — CBC WITH DIFFERENTIAL/PLATELET
Eosinophil #: 0 10*3/uL (ref 0.0–0.7)
Eosinophil %: 0 %
Lymphocyte #: 0.8 10*3/uL — ABNORMAL LOW (ref 1.0–3.6)
Lymphocyte %: 21.9 %
MCH: 30.8 pg (ref 26.0–34.0)
MCV: 93 fL (ref 80–100)
Monocyte #: 0.2 10*3/uL (ref 0.0–0.7)
Platelet: 152 10*3/uL (ref 150–440)
RBC: 3.24 10*6/uL — ABNORMAL LOW (ref 3.80–5.20)

## 2011-09-19 LAB — BASIC METABOLIC PANEL
Anion Gap: 13 (ref 7–16)
BUN: 18 mg/dL (ref 7–18)
Calcium, Total: 7.9 mg/dL — ABNORMAL LOW (ref 8.5–10.1)
Co2: 27 mmol/L (ref 21–32)
Creatinine: 0.94 mg/dL (ref 0.60–1.30)
EGFR (African American): >60
EGFR (Non-African Amer.): >60
Glucose: 234 mg/dL — ABNORMAL HIGH (ref 65–99)
Osmolality: 300 (ref 275–301)
Potassium: 4.3 mmol/L (ref 3.5–5.1)

## 2011-09-19 LAB — TROPONIN I: Troponin-I: <0.02 ng/mL

## 2011-09-20 LAB — HEMOGLOBIN: HGB: 9.4 g/dL — ABNORMAL LOW (ref 12.0–16.0)

## 2011-09-20 LAB — BASIC METABOLIC PANEL
Calcium, Total: 8.1 mg/dL — ABNORMAL LOW (ref 8.5–10.1)
Chloride: 101 mmol/L (ref 98–107)
EGFR (Non-African Amer.): >60
Glucose: 274 mg/dL — ABNORMAL HIGH (ref 65–99)
Potassium: 4.7 mmol/L (ref 3.5–5.1)
Sodium: 141 mmol/L (ref 136–145)

## 2011-09-21 LAB — URINALYSIS, COMPLETE
Bacteria: NONE SEEN
Bilirubin,UR: NEGATIVE
Glucose,UR: NEGATIVE mg/dL (ref 0–75)
Leukocyte Esterase: NEGATIVE
RBC,UR: 369 /HPF (ref 0–5)
Squamous Epithelial: NONE SEEN
WBC UR: 1 /HPF (ref 0–5)

## 2011-09-24 LAB — CULTURE, BLOOD (SINGLE)

## 2011-09-26 LAB — EXPECTORATED SPUTUM ASSESSMENT W GRAM STAIN, RFLX TO RESP C

## 2011-10-16 ENCOUNTER — Emergency Department: Payer: Self-pay | Admitting: Emergency Medicine

## 2011-10-16 LAB — URINALYSIS, COMPLETE
Bilirubin,UR: NEGATIVE
Blood: NEGATIVE
Nitrite: NEGATIVE
Ph: 6 (ref 4.5–8.0)
Protein: 30

## 2011-10-16 LAB — CBC
MCV: 93 fL (ref 80–100)
Platelet: 224 10*3/uL (ref 150–440)
RBC: 3.68 10*6/uL — ABNORMAL LOW (ref 3.80–5.20)
WBC: 5.8 10*3/uL (ref 3.6–11.0)

## 2011-10-16 LAB — COMPREHENSIVE METABOLIC PANEL
BUN: 28 mg/dL — ABNORMAL HIGH (ref 7–18)
Bilirubin,Total: 0.4 mg/dL (ref 0.2–1.0)
Chloride: 96 mmol/L — ABNORMAL LOW (ref 98–107)
Co2: 34 mmol/L — ABNORMAL HIGH (ref 21–32)
Creatinine: 1.29 mg/dL (ref 0.60–1.30)
EGFR (African American): 57 — ABNORMAL LOW
Osmolality: 292 (ref 275–301)
Potassium: 4.4 mmol/L (ref 3.5–5.1)
SGPT (ALT): 24 U/L
Sodium: 140 mmol/L (ref 136–145)
Total Protein: 7.1 g/dL (ref 6.4–8.2)

## 2011-10-16 LAB — TROPONIN I: Troponin-I: <0.02 ng/mL

## 2011-10-20 ENCOUNTER — Ambulatory Visit: Payer: Self-pay | Admitting: Internal Medicine

## 2011-11-23 ENCOUNTER — Ambulatory Visit: Payer: Self-pay | Admitting: Internal Medicine

## 2011-11-23 ENCOUNTER — Emergency Department: Payer: Self-pay | Admitting: Emergency Medicine

## 2011-12-19 DIAGNOSIS — N760 Acute vaginitis: Secondary | ICD-10-CM | POA: Insufficient documentation

## 2012-01-02 ENCOUNTER — Emergency Department: Payer: Self-pay | Admitting: Emergency Medicine

## 2012-01-02 LAB — TROPONIN I: Troponin-I: <0.02 ng/mL

## 2012-01-02 LAB — COMPREHENSIVE METABOLIC PANEL
Albumin: 3.4 g/dL (ref 3.4–5.0)
Anion Gap: 6 — ABNORMAL LOW (ref 7–16)
Bilirubin,Total: 0.2 mg/dL (ref 0.2–1.0)
Calcium, Total: 9.5 mg/dL (ref 8.5–10.1)
Chloride: 99 mmol/L (ref 98–107)
Co2: 32 mmol/L (ref 21–32)
Creatinine: 0.82 mg/dL (ref 0.60–1.30)
EGFR (African American): >60
Glucose: 194 mg/dL — ABNORMAL HIGH (ref 65–99)
Osmolality: 280 (ref 275–301)
Potassium: 4.5 mmol/L (ref 3.5–5.1)
SGOT(AST): 36 U/L (ref 15–37)
Sodium: 137 mmol/L (ref 136–145)
Total Protein: 6.7 g/dL (ref 6.4–8.2)

## 2012-01-02 LAB — CBC
HGB: 11.4 g/dL — ABNORMAL LOW (ref 12.0–16.0)
MCH: 30.1 pg (ref 26.0–34.0)
MCV: 93 fL (ref 80–100)
Platelet: 287 10*3/uL (ref 150–440)
RBC: 3.79 10*6/uL — ABNORMAL LOW (ref 3.80–5.20)
RDW: 13.5 % (ref 11.5–14.5)
WBC: 7.1 10*3/uL (ref 3.6–11.0)

## 2012-01-08 LAB — CULTURE, BLOOD (SINGLE)

## 2012-01-17 IMAGING — CR DG C-ARM 1-60 MIN
2 series · 2 of 2 positions shown · non-contrast
Comparison: none

REASON FOR EXAM: interstim implant
COMMENTS:

PROCEDURE:     DXR - DXR C-ARM WITH SPOT IMAGES  - June 15, 2010 [DATE]
RESULT:     A catheter implant is noted in the left parasacral region. The
catheter tip is in the presacral position just to the left of midline.

[cont. (1 of 2)]
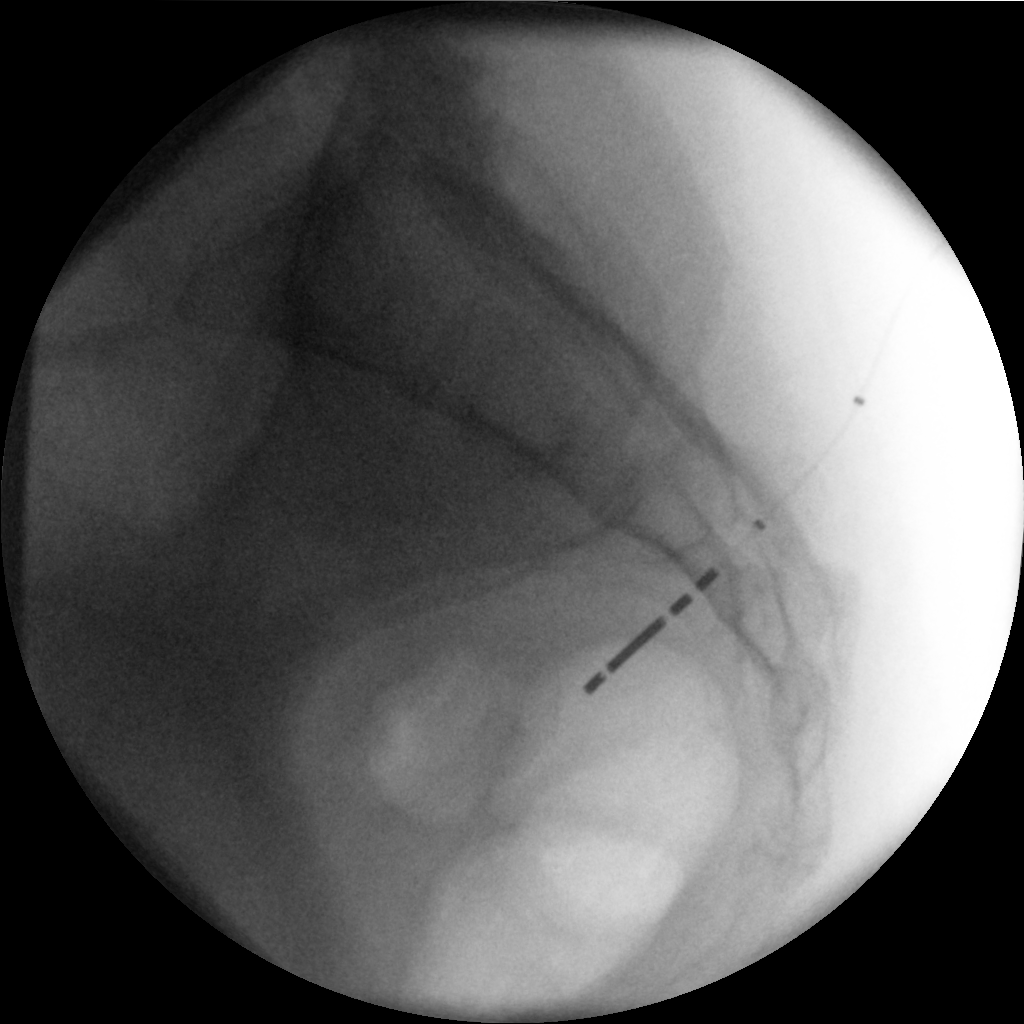

[cont. (2 of 2)]
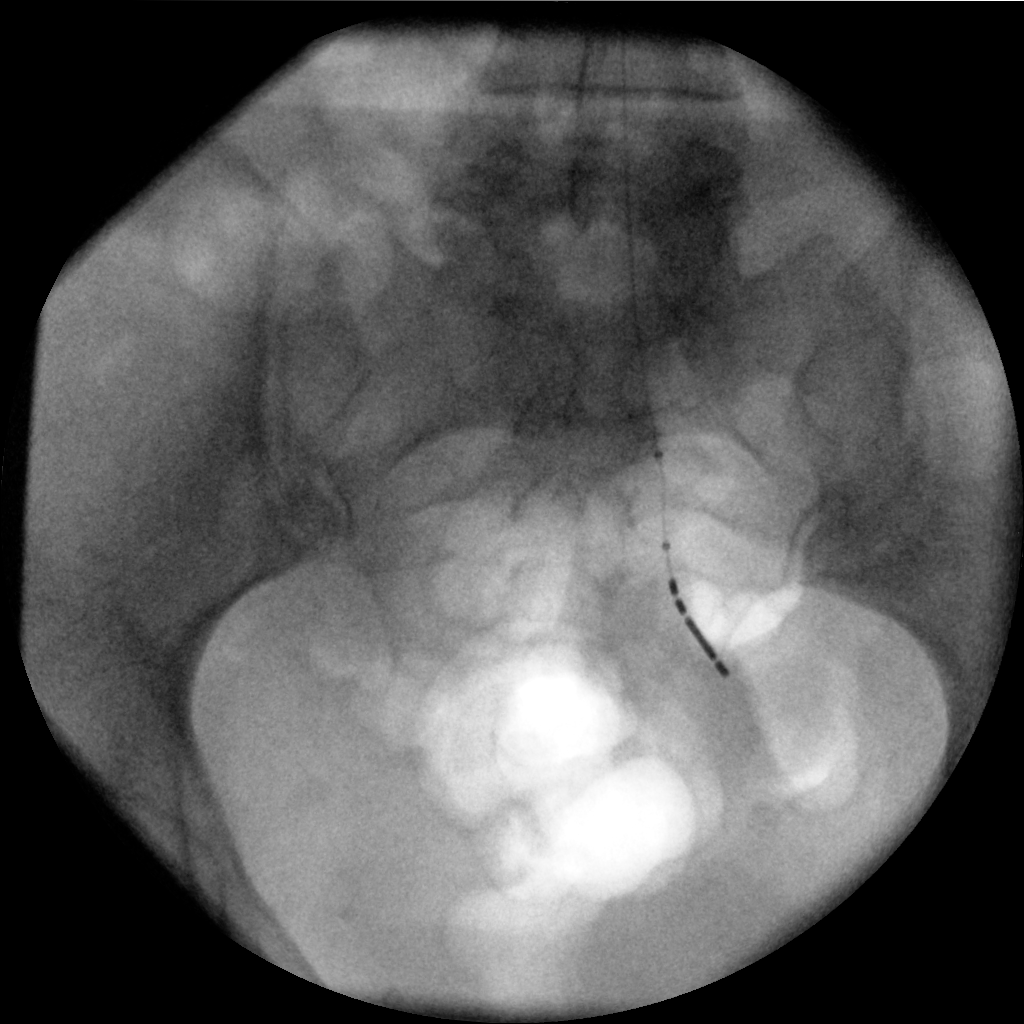

[2 of 2 positions shown; findings below may reference images not displayed]

IMPRESSION: See above.

## 2012-05-02 IMAGING — CT CT ABD-PELV W/ CM
1 of 2 series · 15 of 32 positions shown, 19 images · non-contrast
Comparison: none

REASON FOR EXAM: (1) abd pain; (2) pel pain
COMMENTS:

PROCEDURE:     CT  - CT ABDOMEN / PELVIS  W  - September 29, 2010  [DATE]
RESULT:     CT abdomen and pelvis dated 09/29/2010 comparison made to prior
study dated 08/07/2009.
TECHNIQUE: Helical 5 mm sections were obtained from the lung bases through
the pubic symphysis status post intravenous administration of 100 mL of
Ysovue-FZ0.

[Series 2: abdomen · axial · 0.74mm/px · z∈[-891,-451]mm · 15 of 97 slices shown, 19 images]
[im 5/97  soft-tissue]
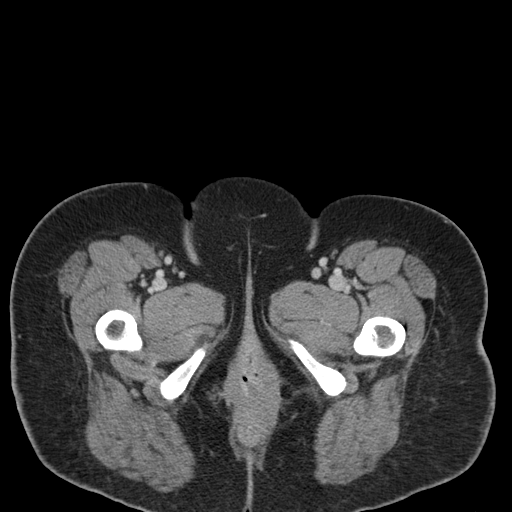
[im 5/97  bone]
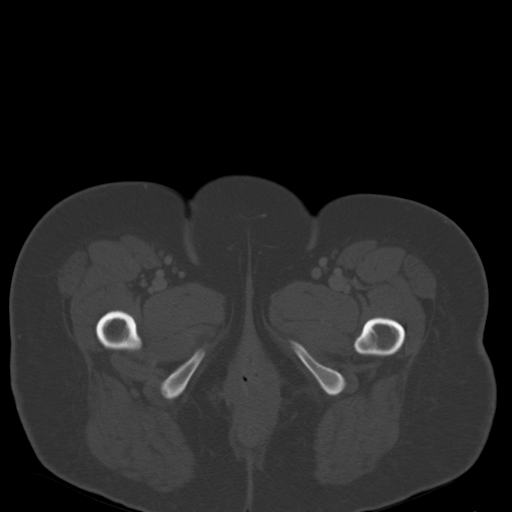
[im 13/97  soft-tissue]
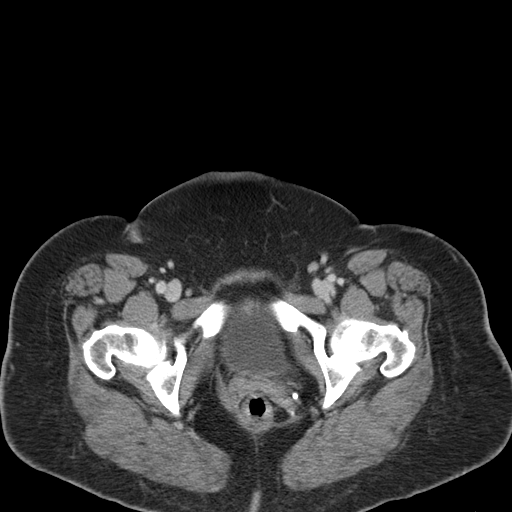
[im 21/97  soft-tissue]
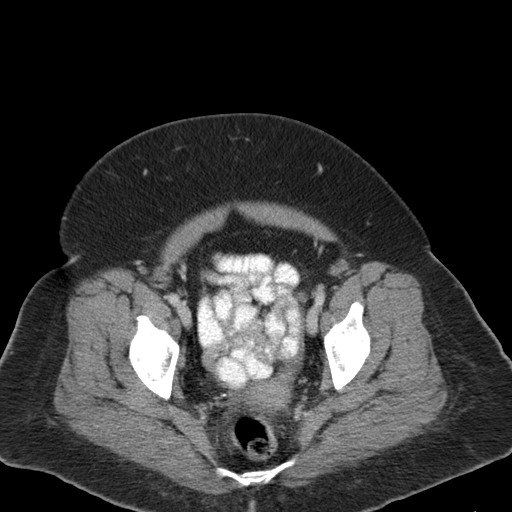
[im 29/97  soft-tissue]
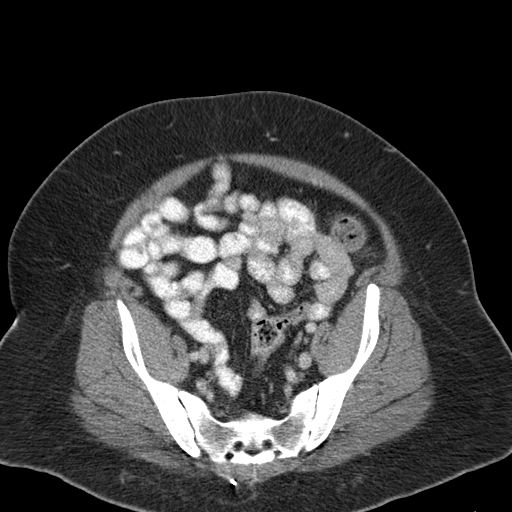
[im 33/97  soft-tissue]
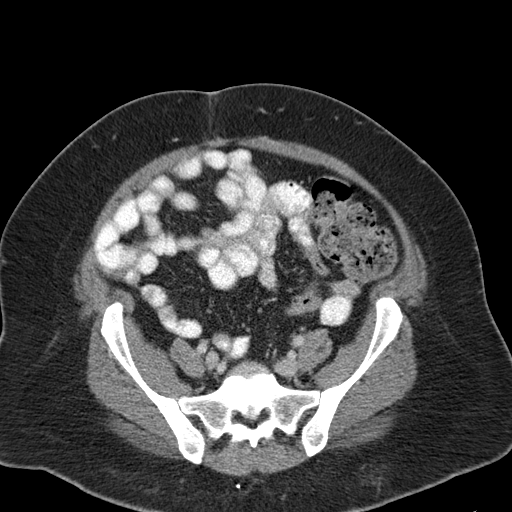
[im 41/97  soft-tissue]
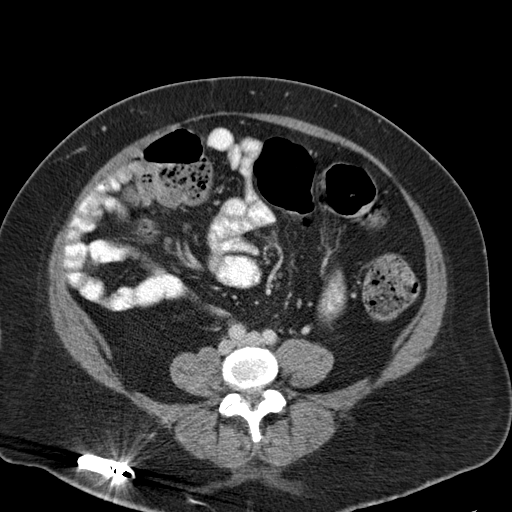
[im 49/97  soft-tissue]
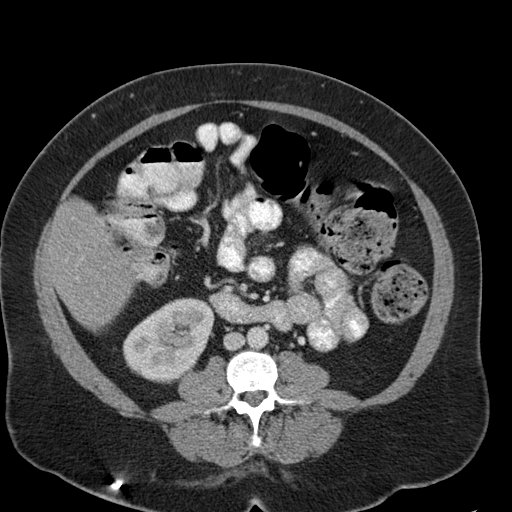
[im 57/97  soft-tissue]
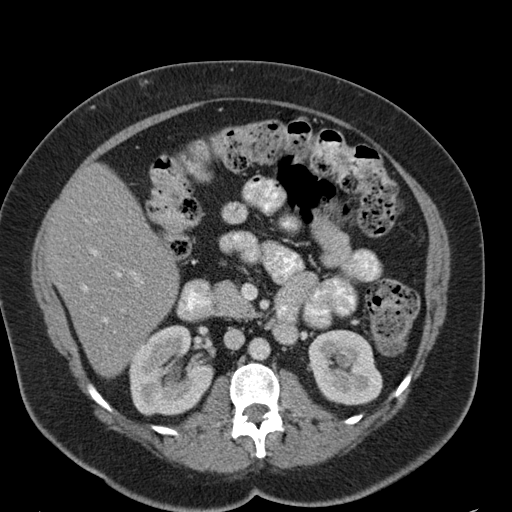
[im 65/97  soft-tissue]
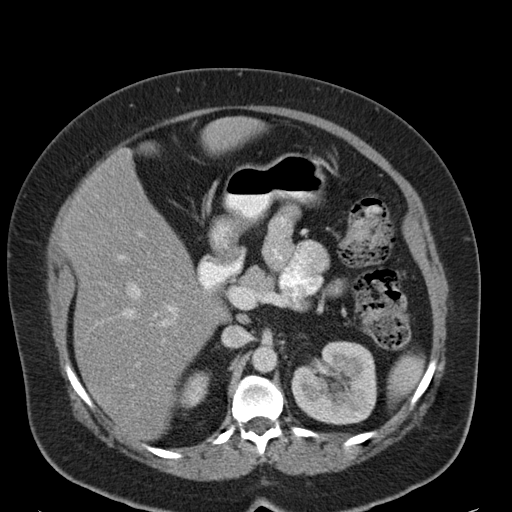
[im 65/97  bone]
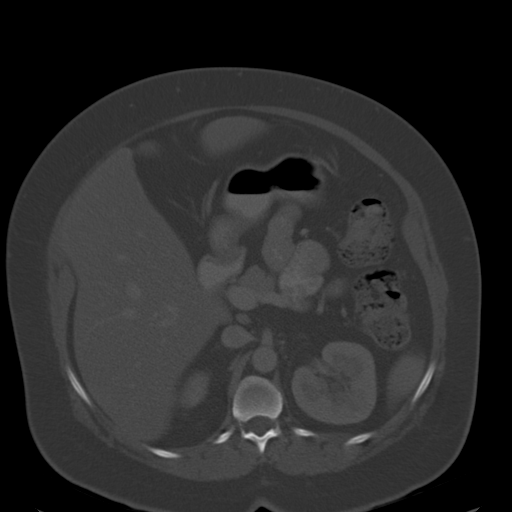
[im 69/97  soft-tissue]
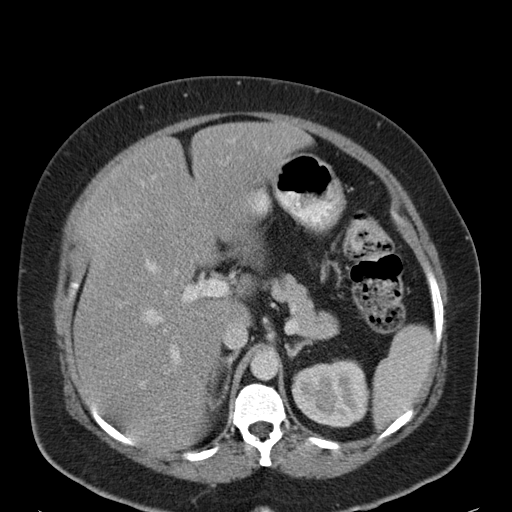
[im 77/97  soft-tissue]
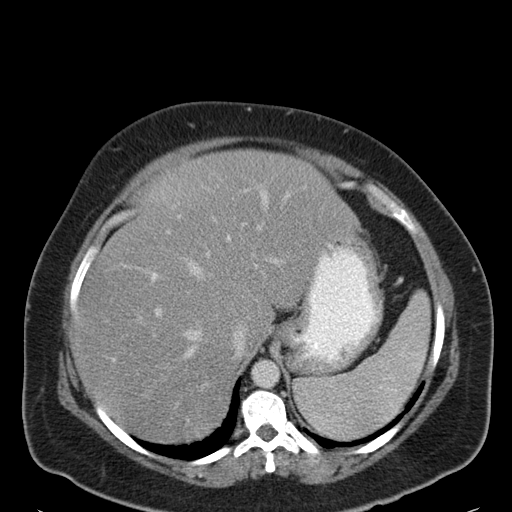
[im 81/97  lung]
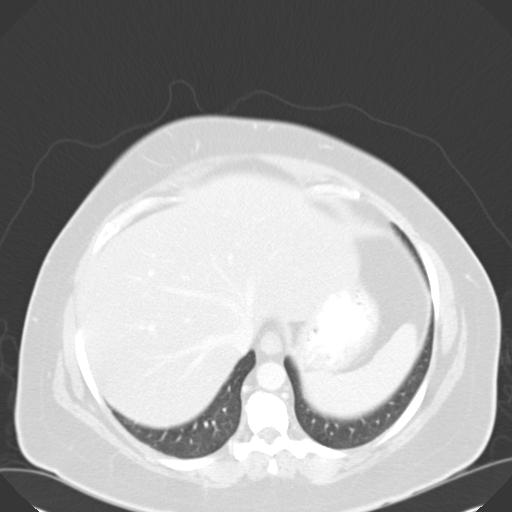
[im 85/97  soft-tissue]
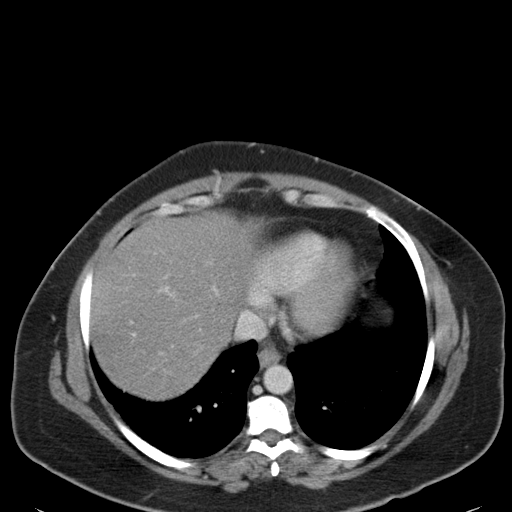
[im 85/97  lung]
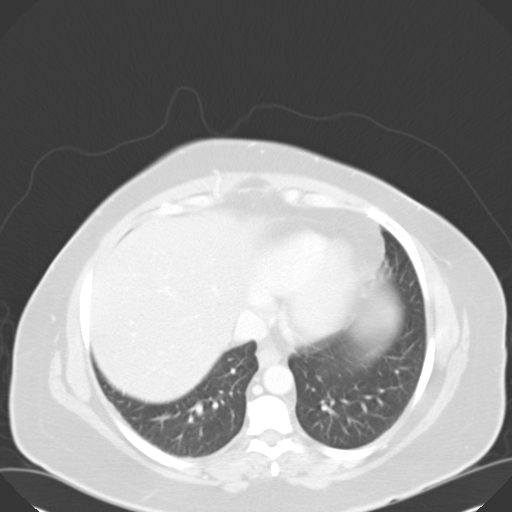
[im 89/97  lung]
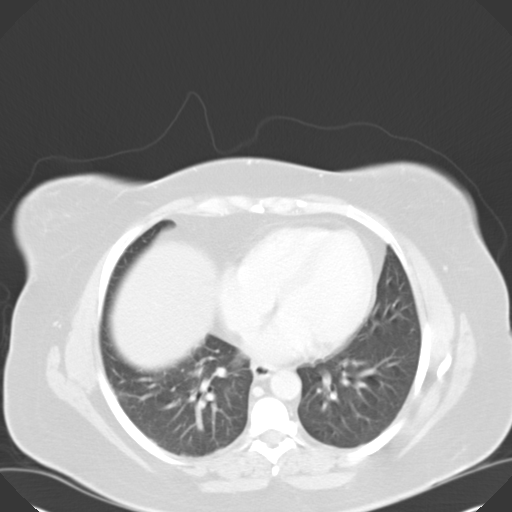
[im 93/97  soft-tissue]
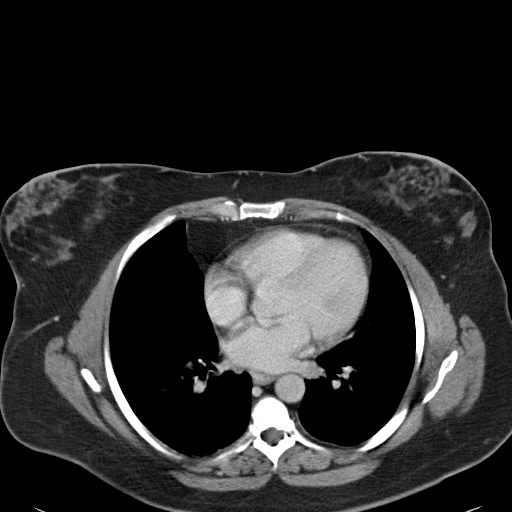
[im 93/97  lung]
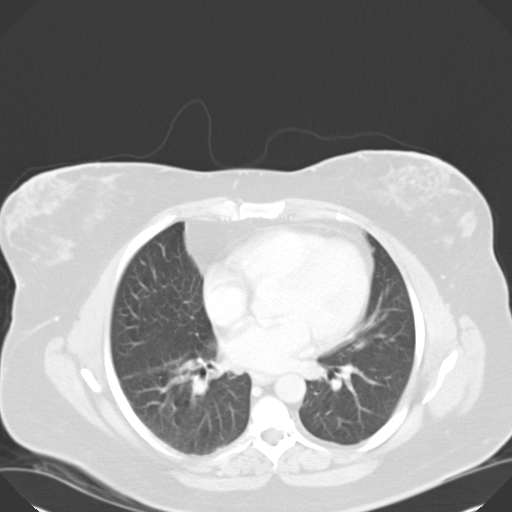

[15 of 32 positions shown; findings below may reference images not displayed]

FINDINGS: The lung bases demonstrates a focal wedge-shaped area of increased
parenchymal density within the lateral basal segment of the right lower lobe.

The liver demonstrates a diffuse low attenuating architecture and is
enlarged measuring 25 x 24 cm in cranial caudal by AP dimensions. The spleen
is prominent measuring 13 cm in AP dimensions. The pancreas, adrenals,
kidneys are unremarkable. There is no CT evidence of bowel obstruction nor
secondary signs reflecting enteritis, colitis, diverticulitis, nor
appendicitis. There is no evidence of abdominal aortic aneurysm, abdominal
or pelvic masses, free fluid, loculated fluid collections, nor adenopathy.
IMPRESSION: Hepatomegaly appears stable when compared to the previous
study.
2. Prominence of the spleen.
3. Atelectasis versus infiltrate in the right lower lobe.
4. Otherwise no evidence of obstructive or inflammatory abnormalities.

## 2012-07-01 IMAGING — CR DG KNEE COMPLETE 4+V*L*
1 series · 4 of 4 positions shown · non-contrast
Comparison: none

REASON FOR EXAM: s/p fall, swelling
COMMENTS:

PROCEDURE:     DXR - DXR KNEE LT COMP WITH OBLIQUES  - November 28, 2010 [DATE]
RESULT:     Comparison: 01/03/2010

[Series 1: view not recorded · 0.17mm/px · 4 of 4 slices shown]
[im 1/4]
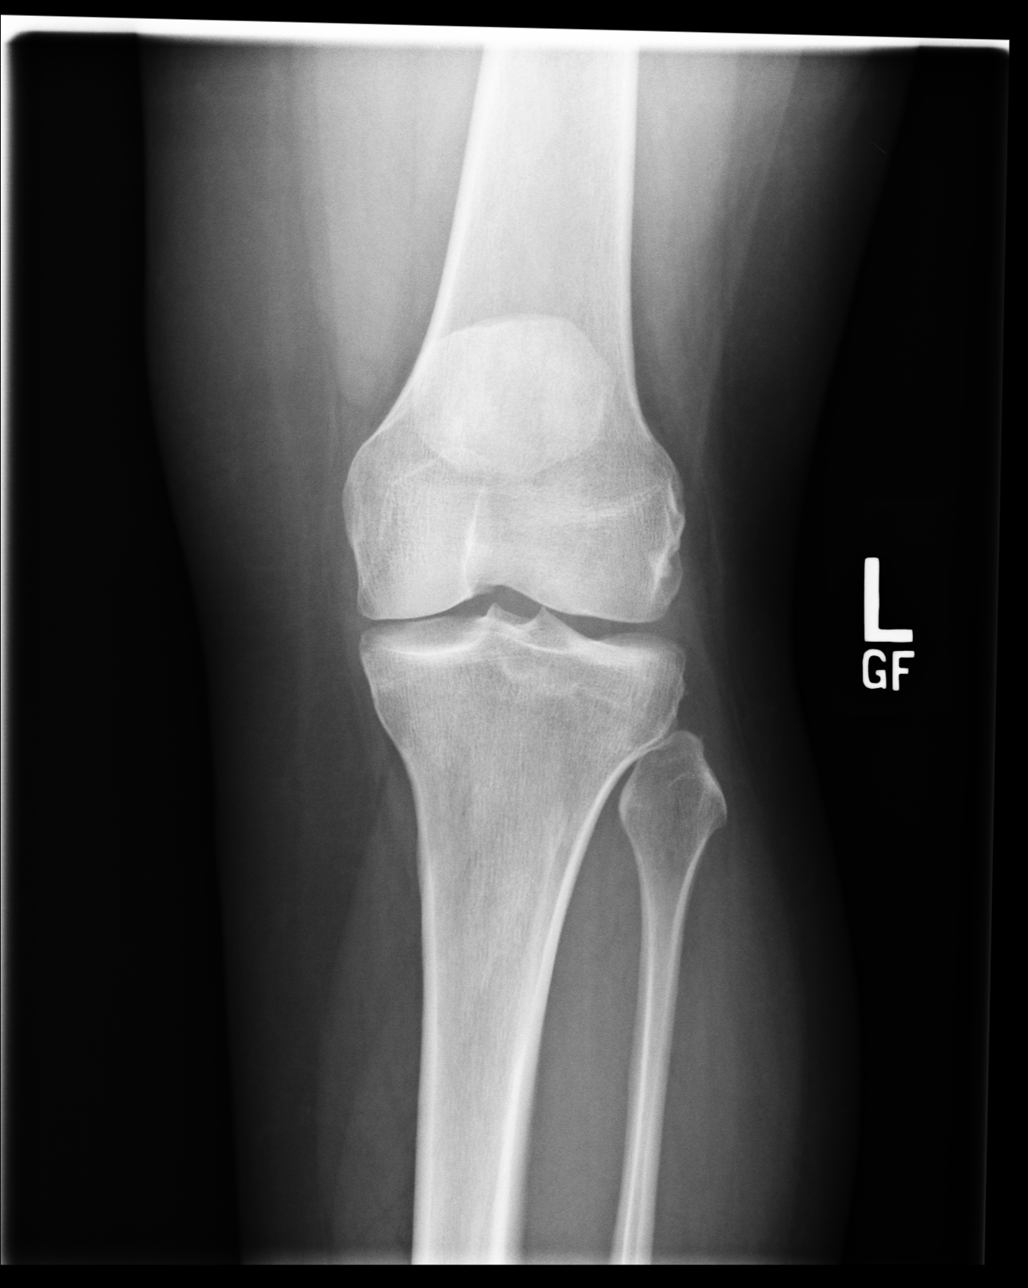
[im 2/4]
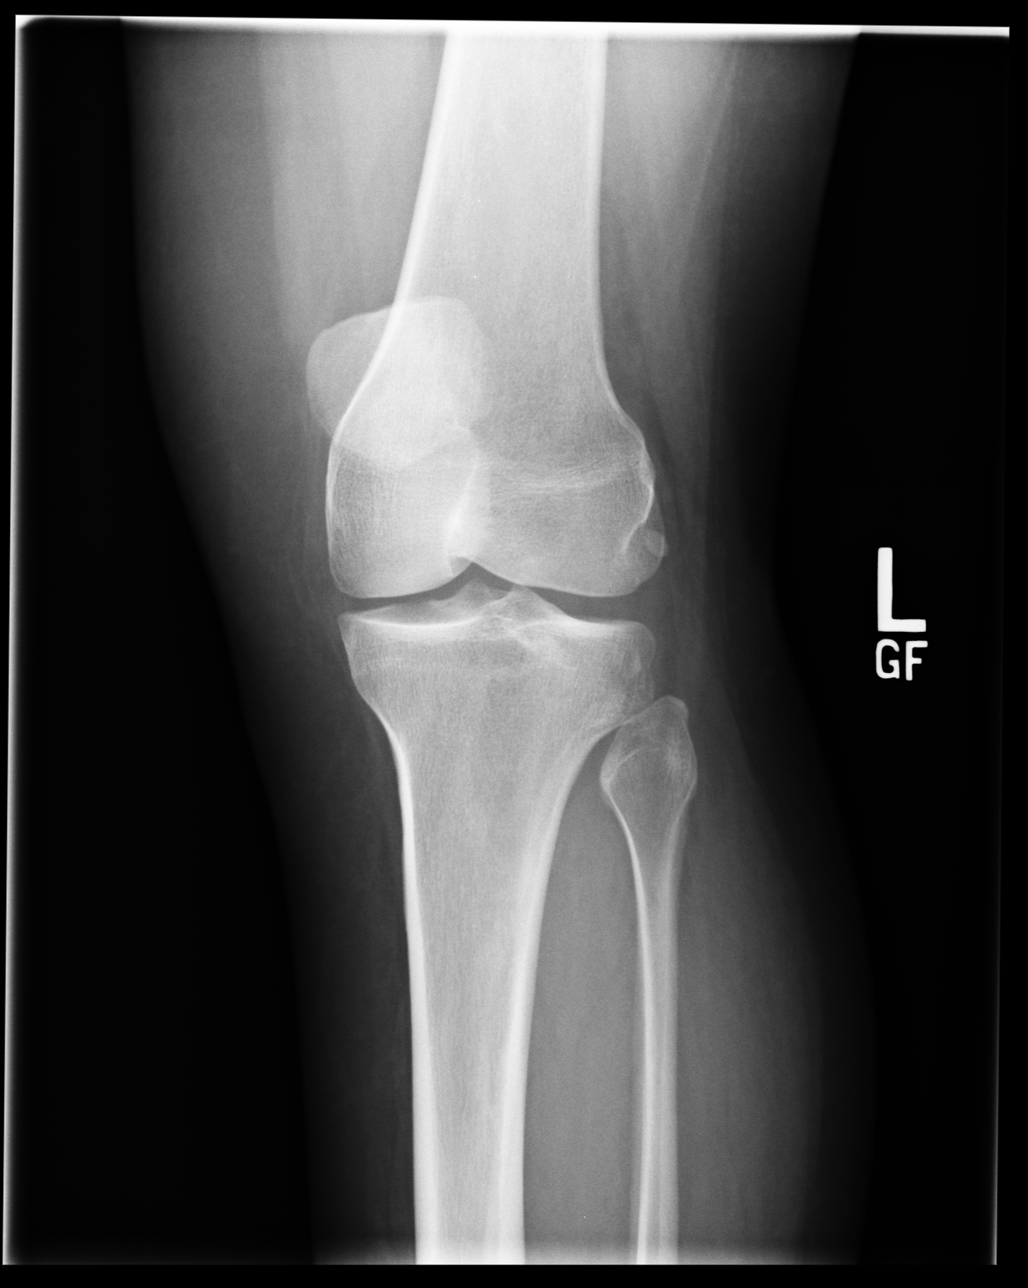
[im 3/4]
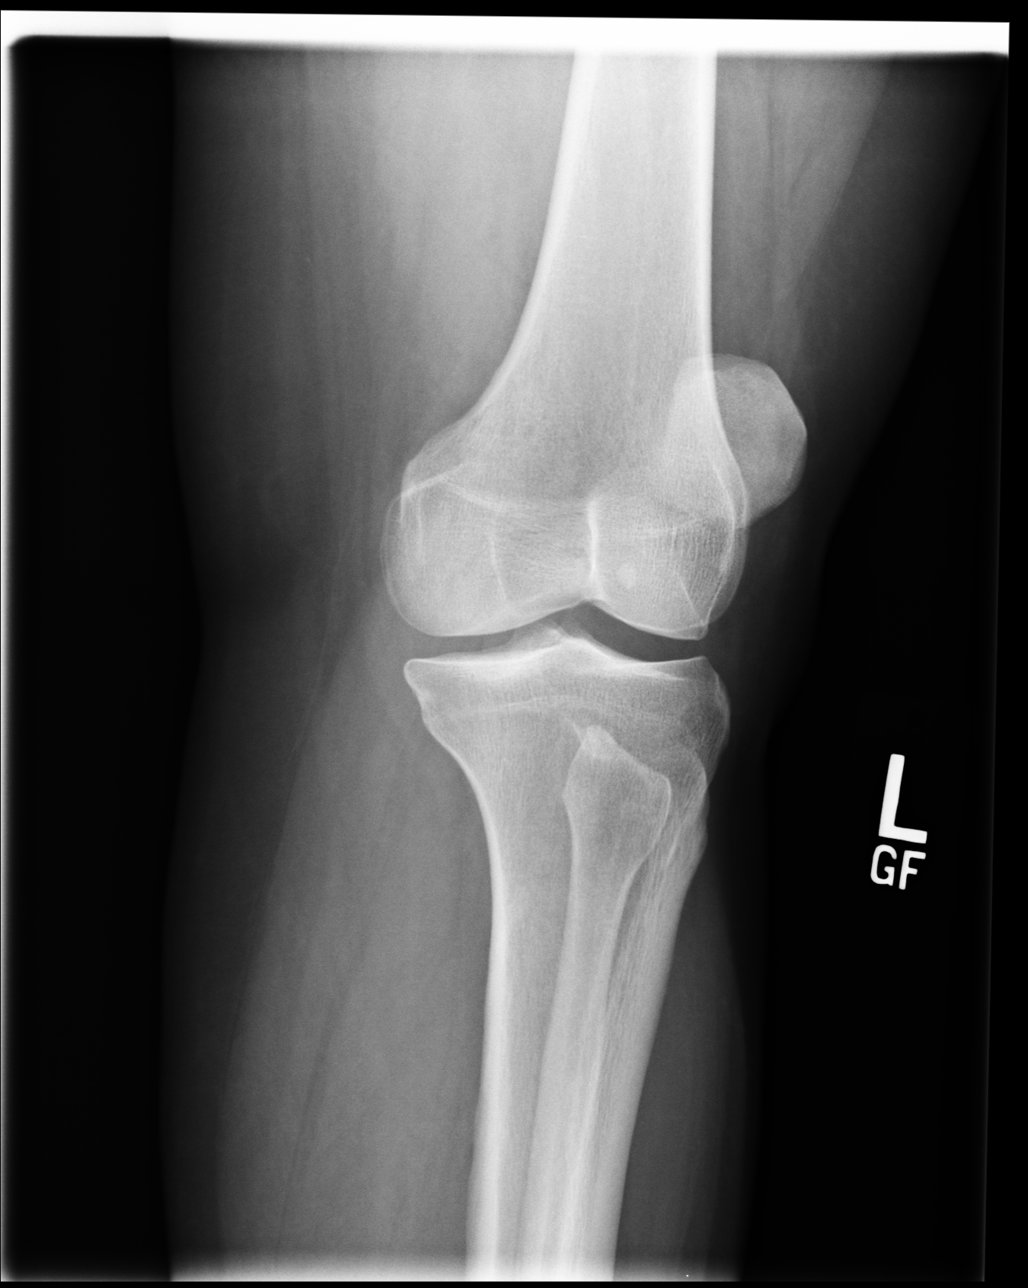
[im 4/4]
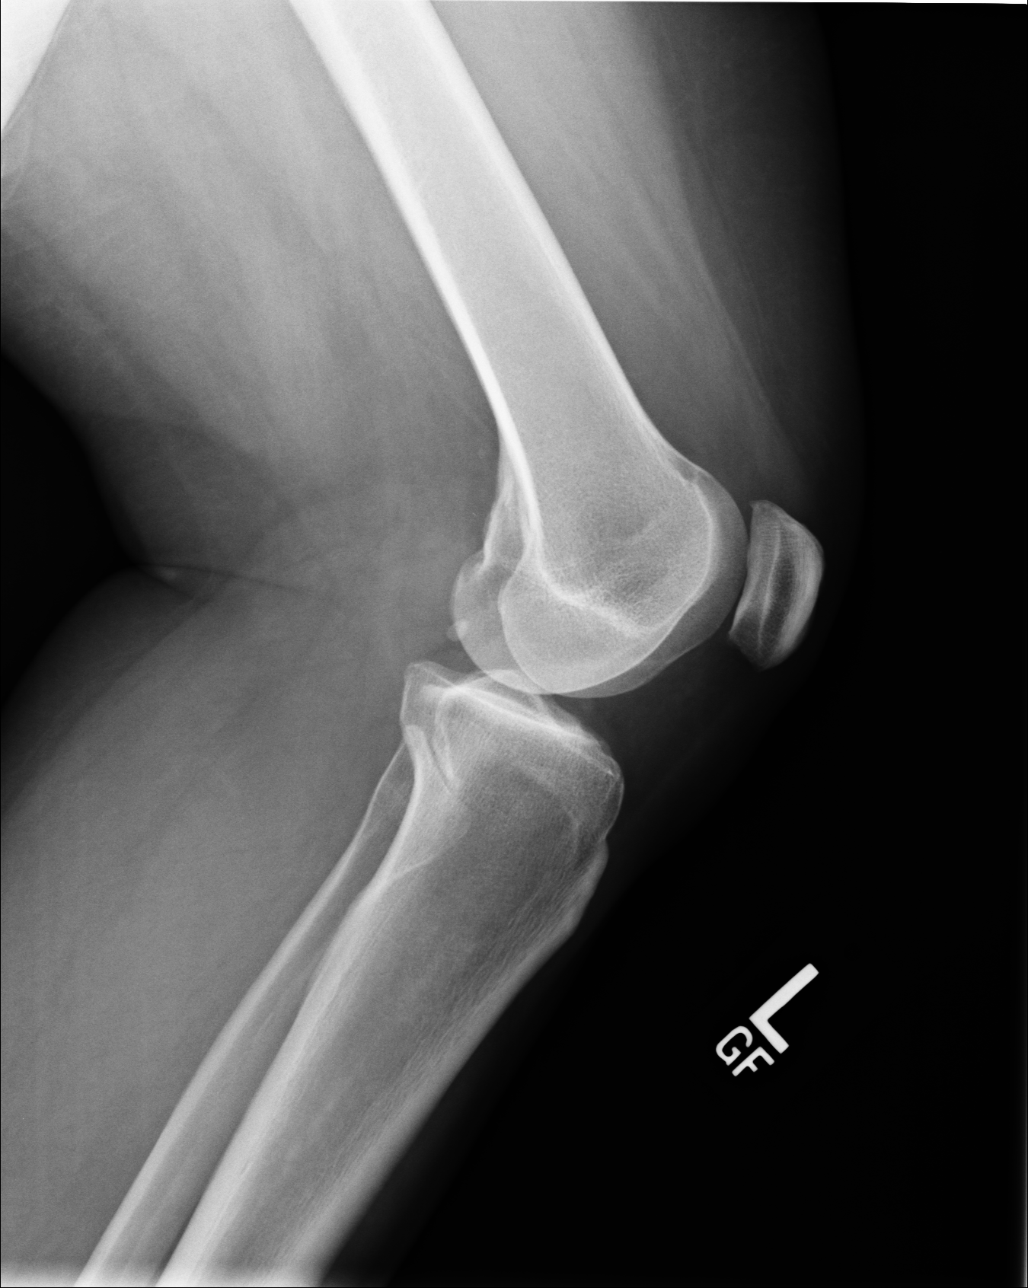

[4 of 4 positions shown; findings below may reference images not displayed]

FINDINGS: No acute fracture. Indeterminate for effusion. Joint spaces are maintained.
IMPRESSION: No acute fracture.

## 2012-07-09 ENCOUNTER — Inpatient Hospital Stay: Payer: Self-pay | Admitting: Psychiatry

## 2012-07-09 LAB — URINALYSIS, COMPLETE
Bacteria: NONE SEEN
Blood: NEGATIVE
Glucose,UR: NEGATIVE mg/dL (ref 0–75)
Protein: NEGATIVE
Specific Gravity: 1.026 (ref 1.003–1.030)
Squamous Epithelial: 10
WBC UR: 1 /HPF (ref 0–5)

## 2012-07-09 LAB — CBC
HCT: 38.5 % (ref 35.0–47.0)
HGB: 13 g/dL (ref 12.0–16.0)
MCH: 30.9 pg (ref 26.0–34.0)
MCHC: 33.7 g/dL (ref 32.0–36.0)
RDW: 14.2 % (ref 11.5–14.5)

## 2012-07-09 LAB — COMPREHENSIVE METABOLIC PANEL
Albumin: 3.2 g/dL — ABNORMAL LOW (ref 3.4–5.0)
Alkaline Phosphatase: 50 U/L (ref 50–136)
Anion Gap: 14 (ref 7–16)
BUN: 16 mg/dL (ref 7–18)
Bilirubin,Total: 0.4 mg/dL (ref 0.2–1.0)
Calcium, Total: 8.8 mg/dL (ref 8.5–10.1)
Creatinine: 0.68 mg/dL (ref 0.60–1.30)
Glucose: 101 mg/dL — ABNORMAL HIGH (ref 65–99)
Osmolality: 273 (ref 275–301)
Potassium: 4.9 mmol/L (ref 3.5–5.1)
SGOT(AST): 33 U/L (ref 15–37)
Sodium: 136 mmol/L (ref 136–145)
Total Protein: 6.7 g/dL (ref 6.4–8.2)

## 2012-07-09 LAB — DRUG SCREEN, URINE
Benzodiazepine, Ur Scrn: POSITIVE (ref ?–200)
Cannabinoid 50 Ng, Ur ~~LOC~~: NEGATIVE (ref ?–50)
MDMA (Ecstasy)Ur Screen: NEGATIVE (ref ?–500)
Methadone, Ur Screen: NEGATIVE (ref ?–300)
Opiate, Ur Screen: NEGATIVE (ref ?–300)
Phencyclidine (PCP) Ur S: NEGATIVE (ref ?–25)

## 2012-07-09 LAB — ETHANOL
Ethanol %: <0.003 % (ref 0.000–0.080)
Ethanol: <3 mg/dL

## 2012-07-10 DIAGNOSIS — Z79899 Other long term (current) drug therapy: Secondary | ICD-10-CM

## 2012-07-10 LAB — LIPID PANEL
HDL Cholesterol: 16 mg/dL — ABNORMAL LOW (ref 40–60)
Triglycerides: 478 mg/dL — ABNORMAL HIGH (ref 0–200)

## 2012-07-10 LAB — VALPROIC ACID LEVEL: Valproic Acid: 57 ug/mL

## 2012-08-29 ENCOUNTER — Emergency Department: Payer: Self-pay | Admitting: Emergency Medicine

## 2012-08-29 LAB — URINALYSIS, COMPLETE
Glucose,UR: NEGATIVE mg/dL (ref 0–75)
Ph: 5 (ref 4.5–8.0)
Specific Gravity: 1.035 (ref 1.003–1.030)
Squamous Epithelial: 106
WBC UR: 78 /HPF (ref 0–5)

## 2012-08-30 LAB — URINE CULTURE

## 2012-09-01 ENCOUNTER — Emergency Department: Payer: Self-pay | Admitting: Emergency Medicine

## 2012-09-01 LAB — CBC
HGB: 11.9 g/dL — ABNORMAL LOW (ref 12.0–16.0)
MCV: 92 fL (ref 80–100)
Platelet: 257 10*3/uL (ref 150–440)
RBC: 3.94 10*6/uL (ref 3.80–5.20)
WBC: 5.1 10*3/uL (ref 3.6–11.0)

## 2012-09-01 LAB — BASIC METABOLIC PANEL
Anion Gap: 8 (ref 7–16)
Chloride: 104 mmol/L (ref 98–107)
Co2: 28 mmol/L (ref 21–32)
EGFR (African American): >60
Osmolality: 281 (ref 275–301)

## 2012-09-01 LAB — CK TOTAL AND CKMB (NOT AT ARMC)
CK, Total: 145 U/L (ref 21–215)
CK-MB: 1.8 ng/mL (ref 0.5–3.6)

## 2012-09-01 LAB — PRO B NATRIURETIC PEPTIDE: B-Type Natriuretic Peptide: 82 pg/mL (ref 0–125)

## 2012-09-01 LAB — TROPONIN I: Troponin-I: <0.02 ng/mL

## 2012-09-18 ENCOUNTER — Emergency Department: Payer: Self-pay | Admitting: Emergency Medicine

## 2012-09-18 LAB — URINALYSIS, COMPLETE
Bacteria: NONE SEEN
Blood: NEGATIVE
Glucose,UR: >=500 mg/dL (ref 0–75)
Nitrite: NEGATIVE
Protein: NEGATIVE
Specific Gravity: 1.028 (ref 1.003–1.030)
Squamous Epithelial: 2

## 2012-09-18 LAB — CBC
MCH: 30.6 pg (ref 26.0–34.0)
Platelet: 247 10*3/uL (ref 150–440)
RDW: 13.8 % (ref 11.5–14.5)
WBC: 8.6 10*3/uL (ref 3.6–11.0)

## 2012-09-18 LAB — COMPREHENSIVE METABOLIC PANEL
Albumin: 3.6 g/dL (ref 3.4–5.0)
Alkaline Phosphatase: 53 U/L (ref 50–136)
Anion Gap: 18 — ABNORMAL HIGH (ref 7–16)
BUN: 16 mg/dL (ref 7–18)
Calcium, Total: 9.4 mg/dL (ref 8.5–10.1)
Co2: 18 mmol/L — ABNORMAL LOW (ref 21–32)
EGFR (Non-African Amer.): 46 — ABNORMAL LOW
Glucose: 432 mg/dL — ABNORMAL HIGH (ref 65–99)
Osmolality: 292 (ref 275–301)
Potassium: 4.8 mmol/L (ref 3.5–5.1)
SGOT(AST): 37 U/L (ref 15–37)
SGPT (ALT): 60 U/L (ref 12–78)
Total Protein: 7 g/dL (ref 6.4–8.2)

## 2012-10-28 IMAGING — CR DG CHEST 2V
1 series · 3 of 3 positions shown · non-contrast
Comparison: none

REASON FOR EXAM: shortness of breath fax report  [phone]
COMMENTS:

[Series 1: view not recorded · 0.17mm/px · 3 of 3 slices shown]
[im 1/3]
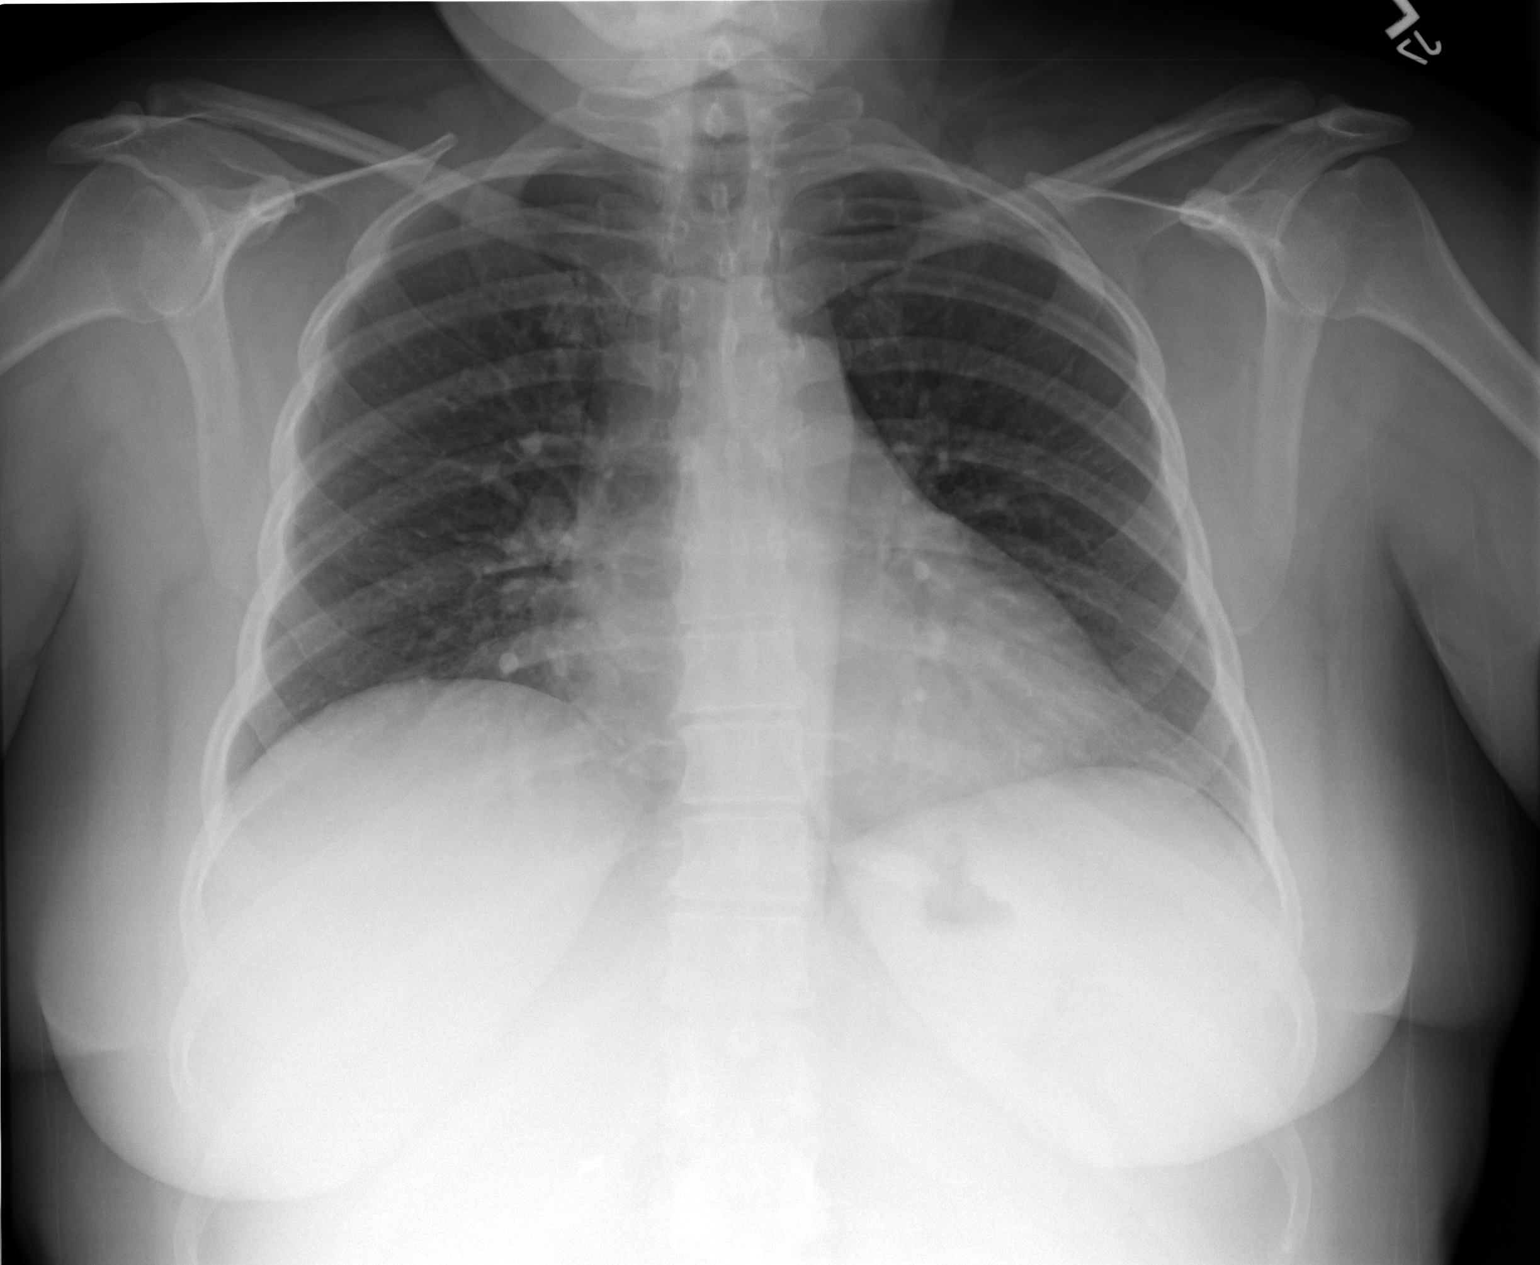
[im 2/3]
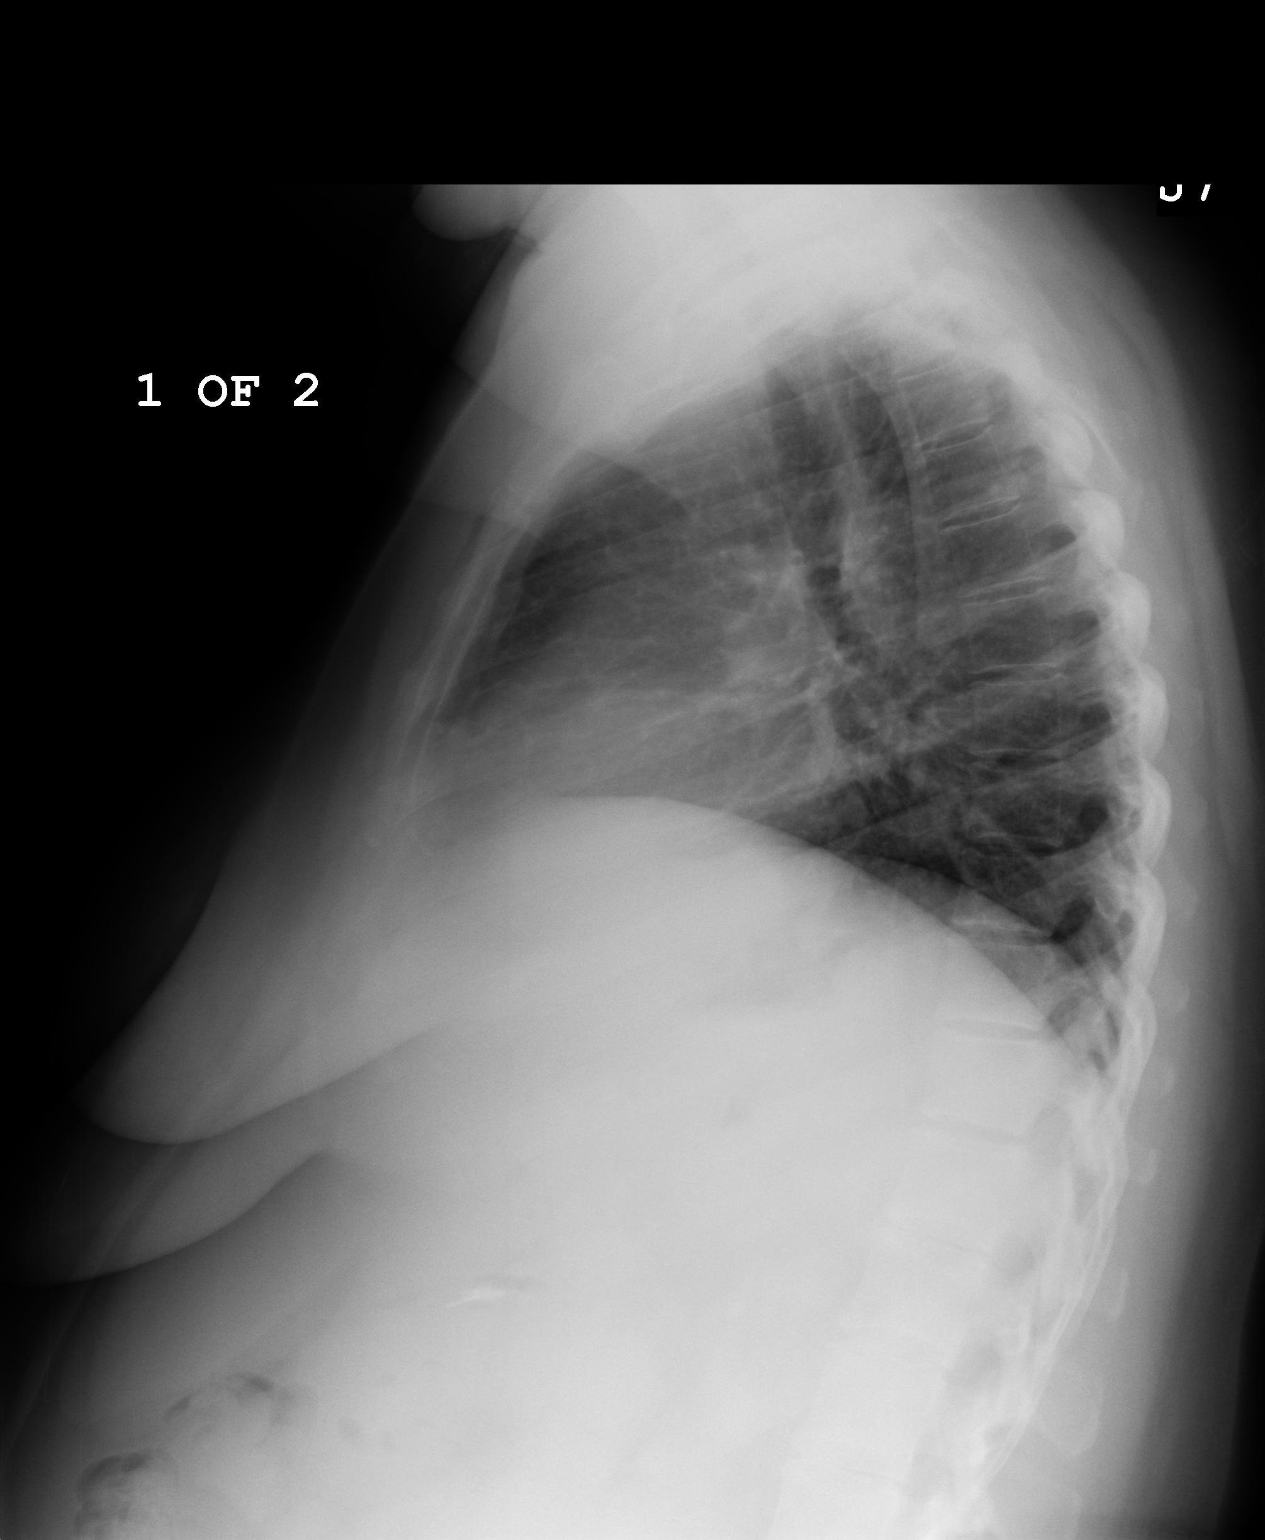
[im 3/3]
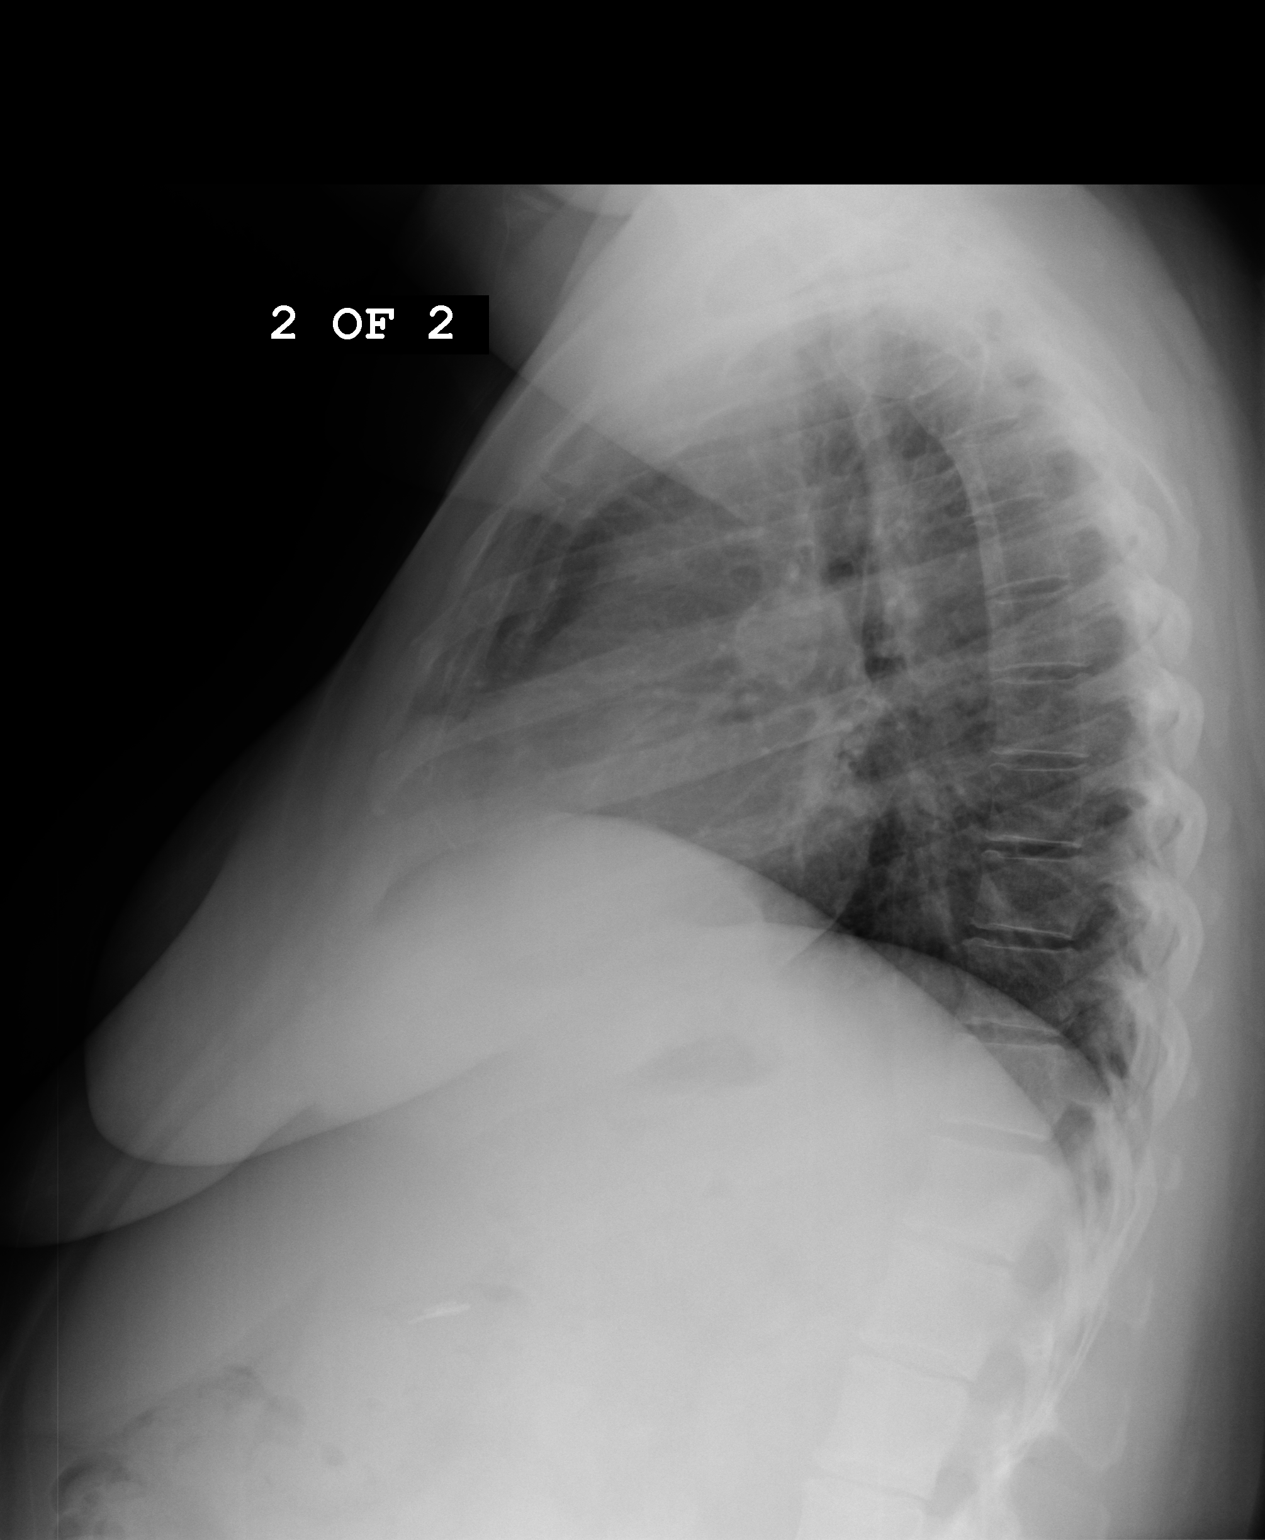

[3 of 3 positions shown; findings below may reference images not displayed]

PROCEDURE:     DXR - DXR CHEST PA (OR AP) AND LATERAL  - March 27, 2011 [DATE]

RESULT:     Comparison is made to prior exam of 11/20/2009. The lung fields
are clear. No pneumonia, pneumothorax or pleural effusion is seen. The
changes of pulmonary vascular congestion observed on the prior exam have now
cleared. The heart is upper limits for normal in size or mildly enlarged.
The osseous structures are normal in appearance.
IMPRESSION: 1. The lung fields are clear.
2. The heart is upper limits normal in size or slightly enlarged.

## 2012-11-06 ENCOUNTER — Emergency Department: Payer: Self-pay | Admitting: Emergency Medicine

## 2012-11-06 LAB — BASIC METABOLIC PANEL
BUN: 13 mg/dL (ref 7–18)
Calcium, Total: 8.9 mg/dL (ref 8.5–10.1)
Creatinine: 0.91 mg/dL (ref 0.60–1.30)
Glucose: 147 mg/dL — ABNORMAL HIGH (ref 65–99)
Osmolality: 275 (ref 275–301)
Potassium: 4.5 mmol/L (ref 3.5–5.1)

## 2012-11-06 LAB — CBC
HGB: 12.7 g/dL (ref 12.0–16.0)
MCH: 30 pg (ref 26.0–34.0)
MCHC: 32.8 g/dL (ref 32.0–36.0)
RBC: 4.21 10*6/uL (ref 3.80–5.20)
RDW: 15 % — ABNORMAL HIGH (ref 11.5–14.5)

## 2012-11-11 IMAGING — CR DG CHEST 2V
1 series · 2 of 2 positions shown · non-contrast
Comparison: none

REASON FOR EXAM: chest pain
COMMENTS:

[Series 1: view not recorded · 0.17mm/px · 2 of 2 slices shown]
[im 1/2]
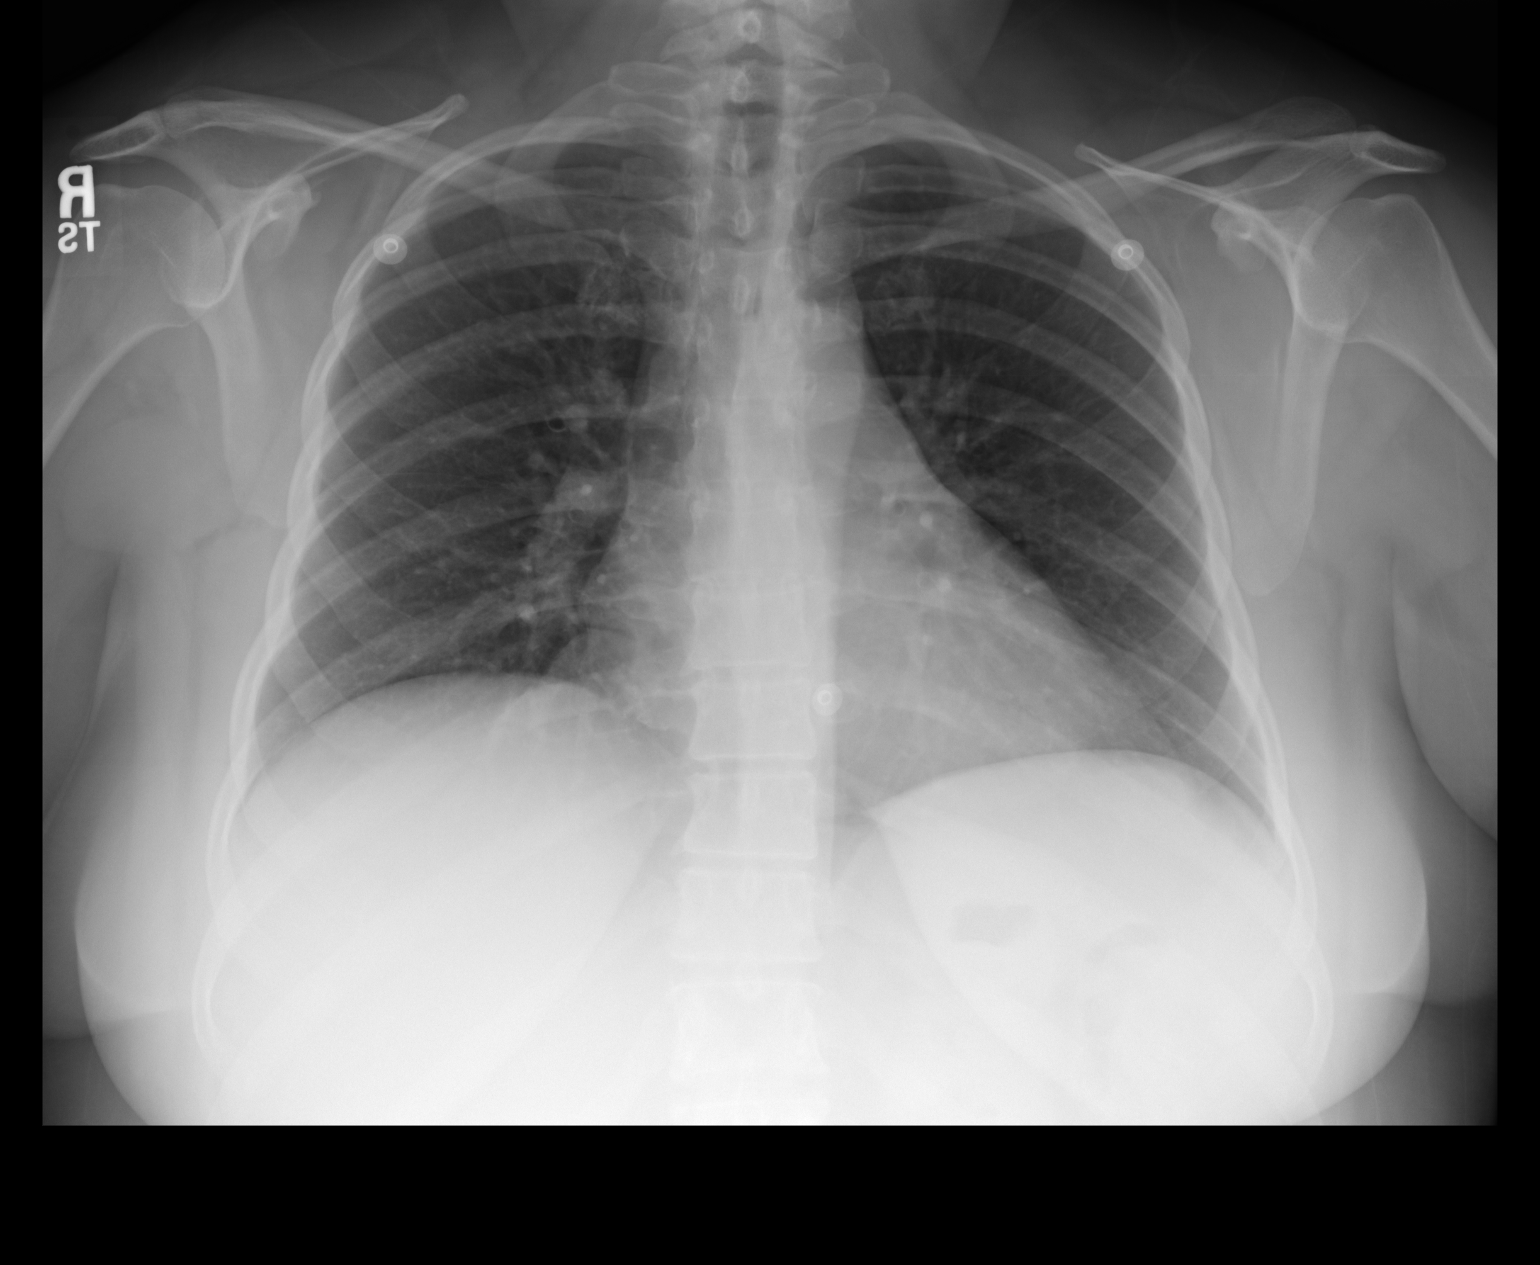
[im 2/2]
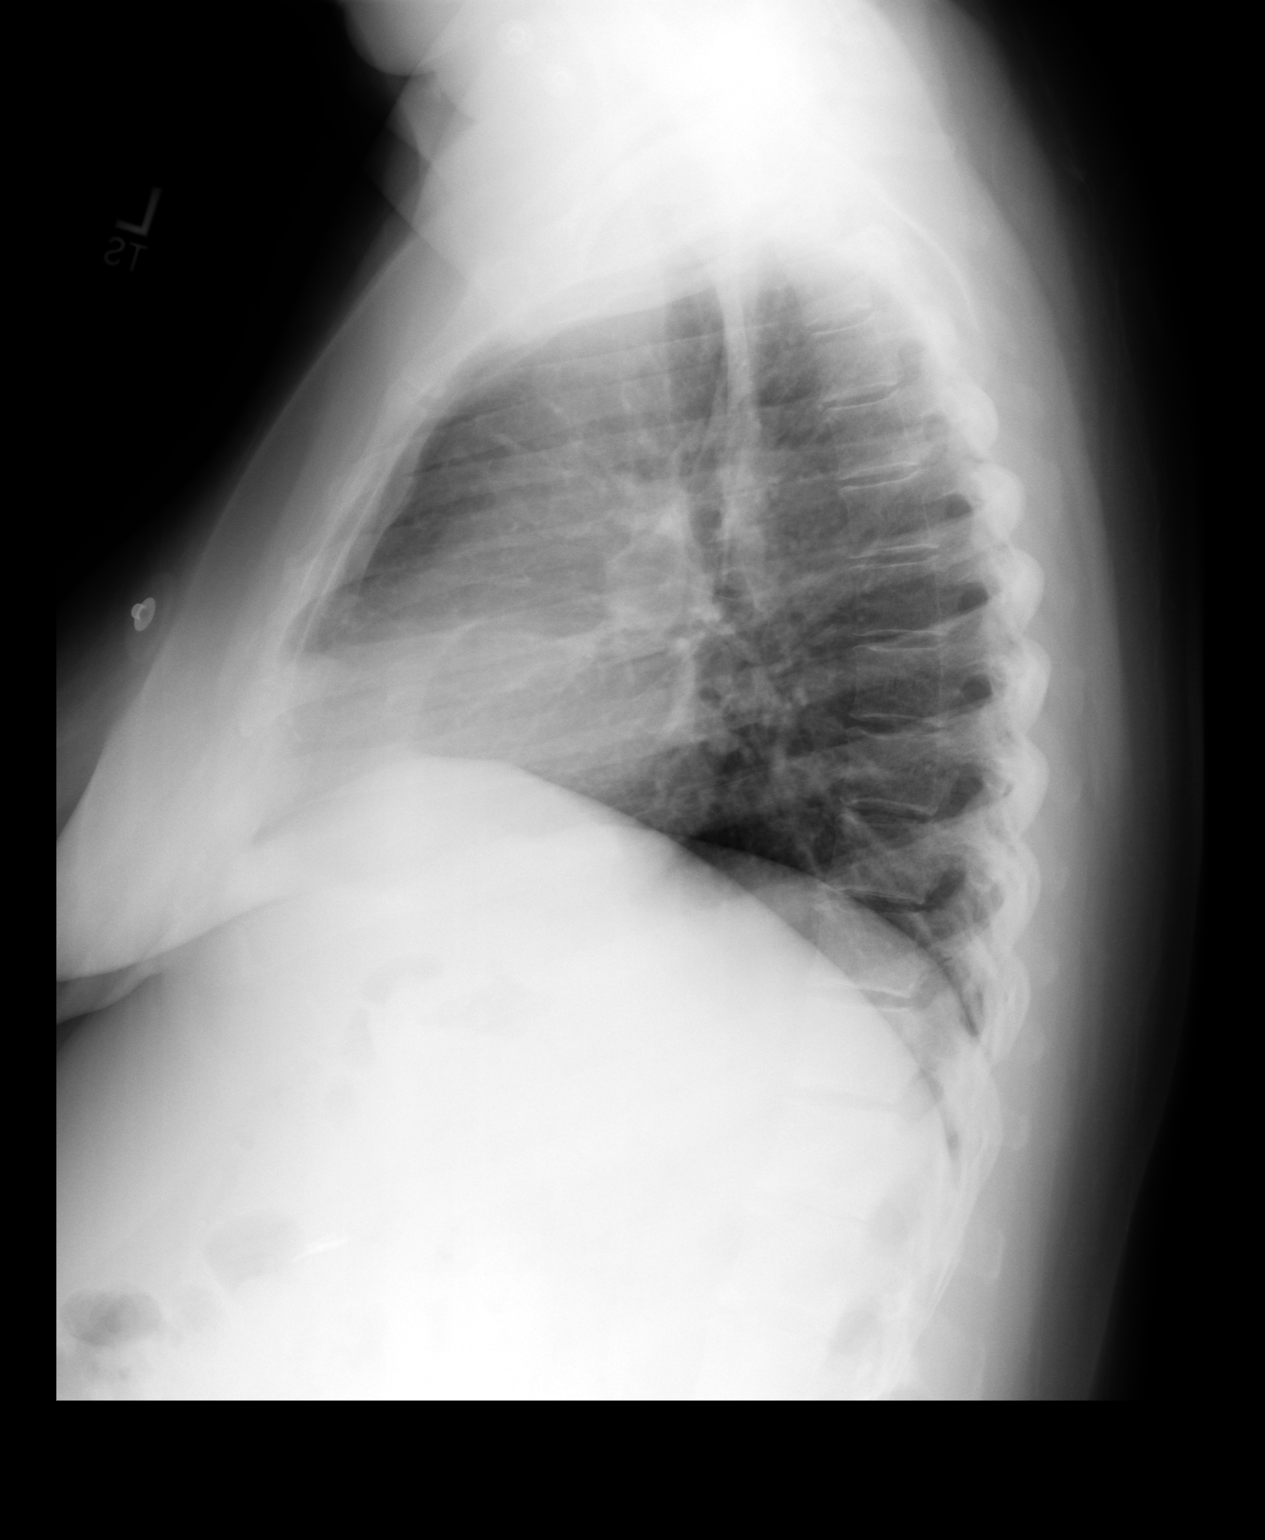

[2 of 2 positions shown; findings below may reference images not displayed]

PROCEDURE:     DXR - DXR CHEST PA (OR AP) AND LATERAL  - April 10, 2011 [DATE]

RESULT:     Comparison is made to the study of 03/27/2011.

The lungs are clear. The heart and pulmonary vessels are normal. The bony
and mediastinal structures are unremarkable. There is no effusion. There is
no pneumothorax or evidence of congestive failure.
IMPRESSION: No acute cardiopulmonary disease.

## 2012-11-29 IMAGING — CT CT ABD-PELV W/ CM
1 of 2 series · 16 of 32 positions shown, 20 images · IV contrast (isovue)
Comparison: none

REASON FOR EXAM: (1) generalized abdominal & back pain radiating tio
legs. fever; (2) generalized
COMMENTS:

PROCEDURE:     CT  - CT ABDOMEN / PELVIS  W  - April 28, 2011  [DATE]
RESULT:     Comparison:  09/29/2010
TECHNIQUE: Multiple axial images of the abdomen and pelvis were performed
from the lung bases to the pubic symphysis, with p.o. contrast and with 80
mL of Isovue 300 intravenous contrast.

[Series 2: 3mm soft tissue · axial · 0.82mm/px · z∈[+639,+1089]mm · 16 of 164 slices shown, 20 images]
[im 7/164  soft-tissue]
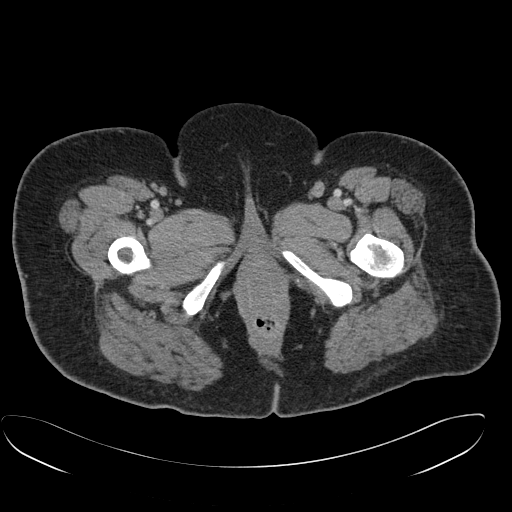
[im 7/164  bone]
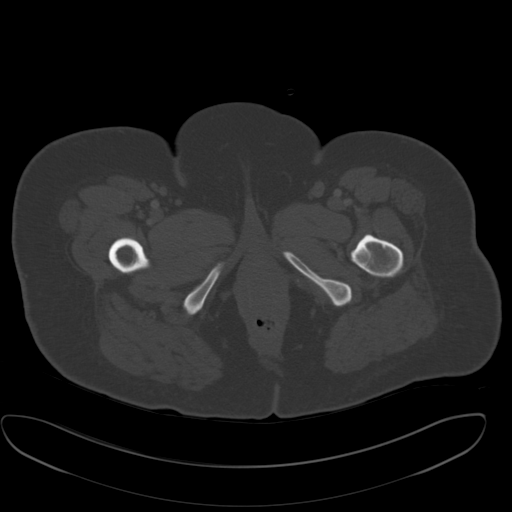
[im 21/164  soft-tissue]
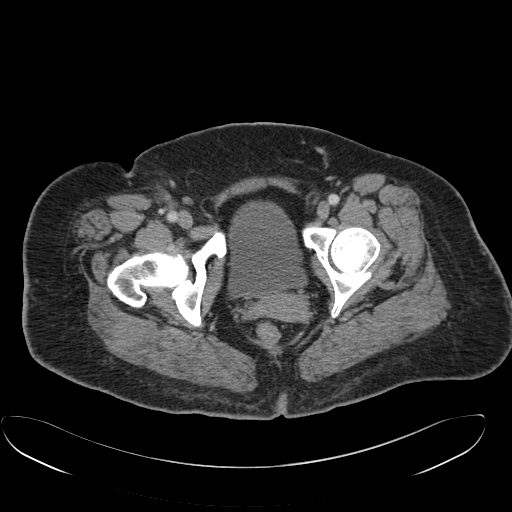
[im 34/164  soft-tissue]
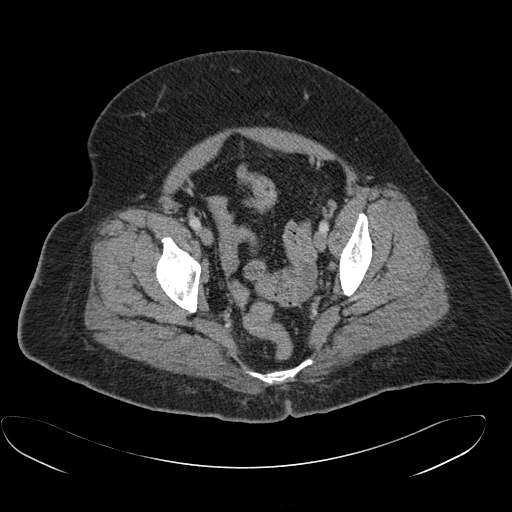
[im 41/164  soft-tissue]
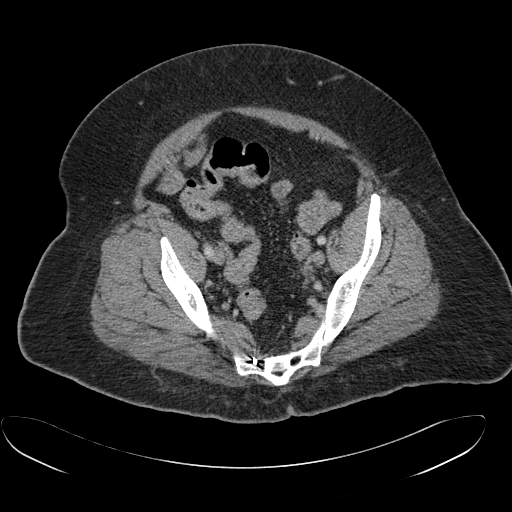
[im 55/164  soft-tissue]
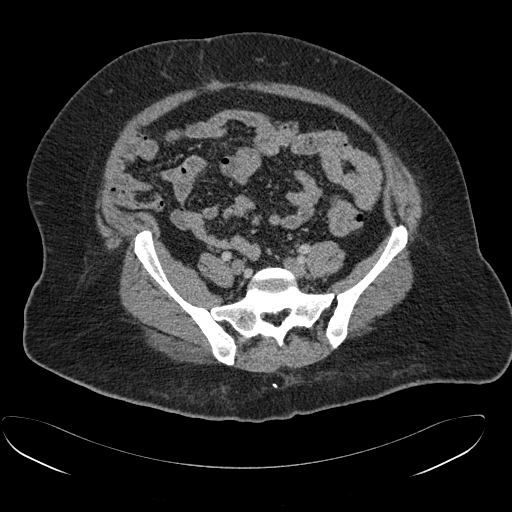
[im 68/164  soft-tissue]
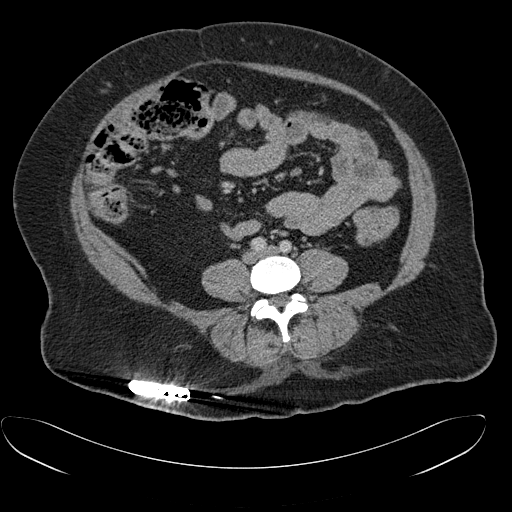
[im 75/164  soft-tissue]
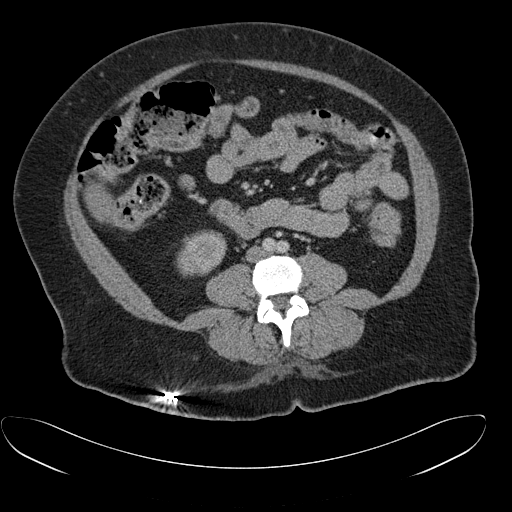
[im 89/164  soft-tissue]
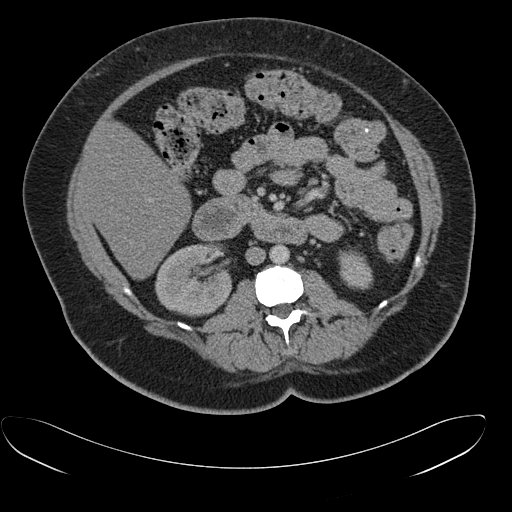
[im 96/164  soft-tissue]
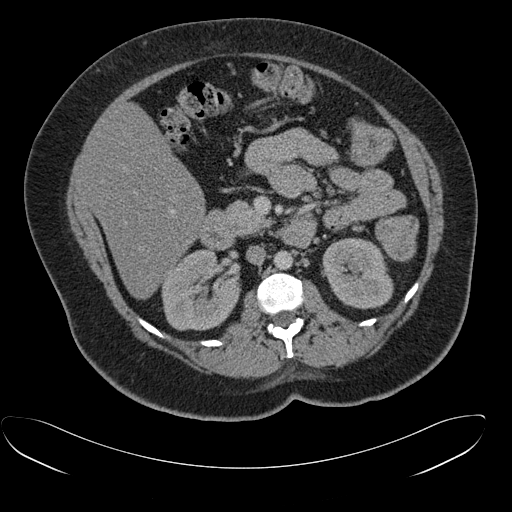
[im 96/164  bone]
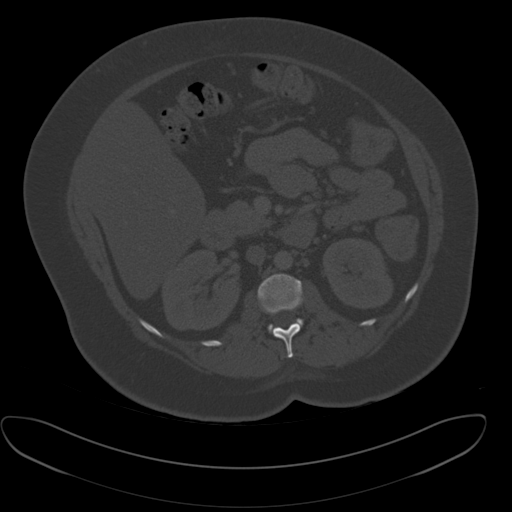
[im 109/164  soft-tissue]
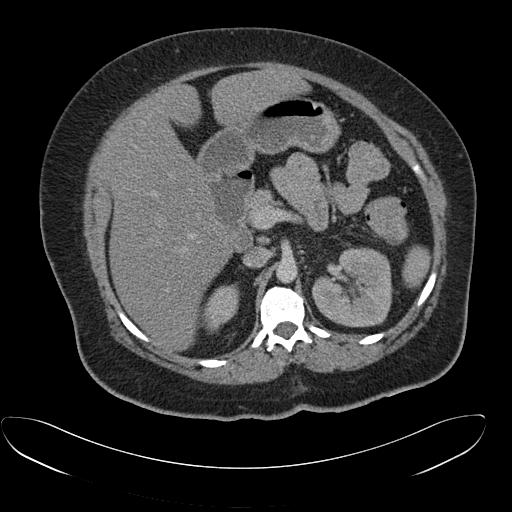
[im 123/164  soft-tissue]
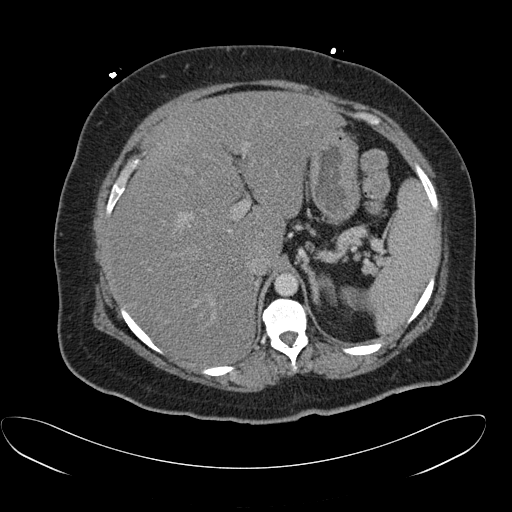
[im 130/164  soft-tissue]
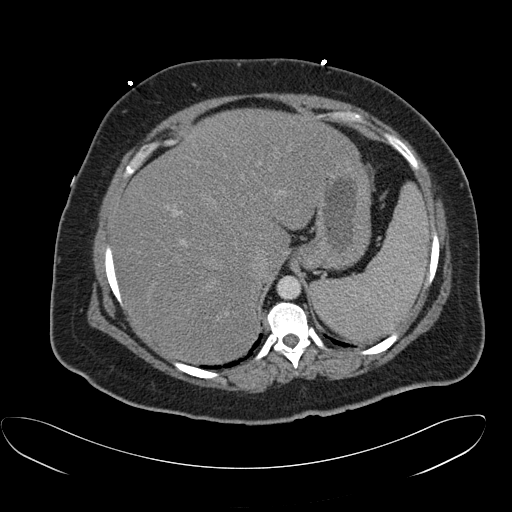
[im 136/164  lung]
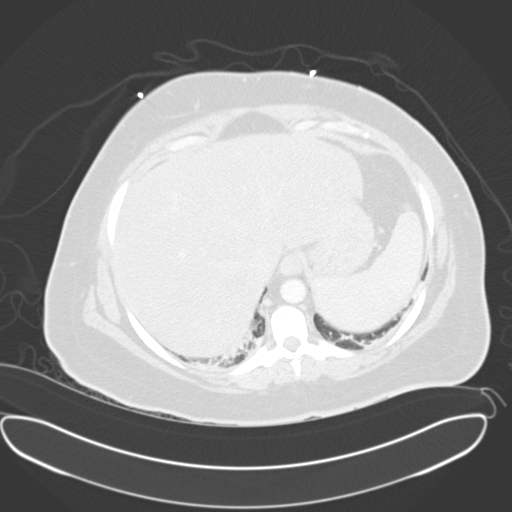
[im 143/164  soft-tissue]
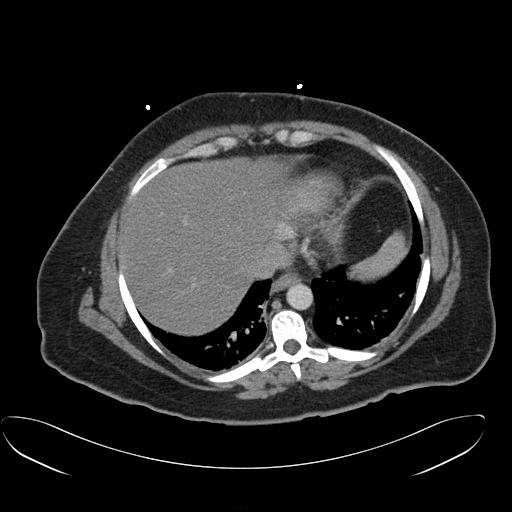
[im 143/164  lung]
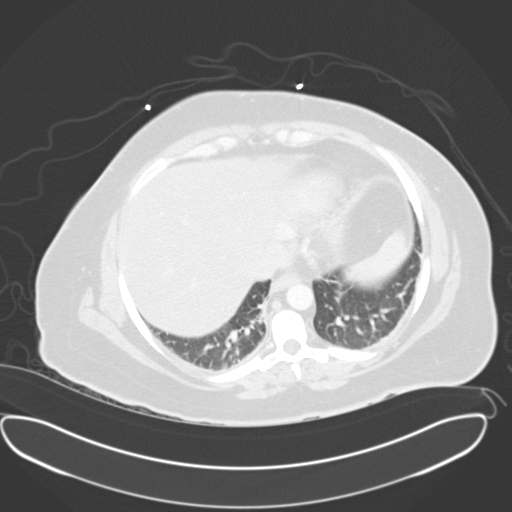
[im 150/164  lung]
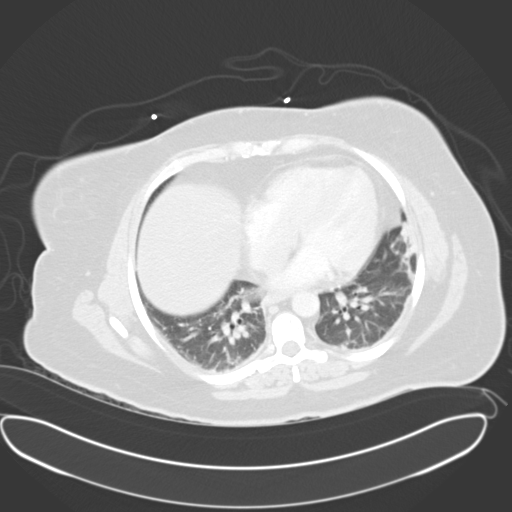
[im 157/164  soft-tissue]
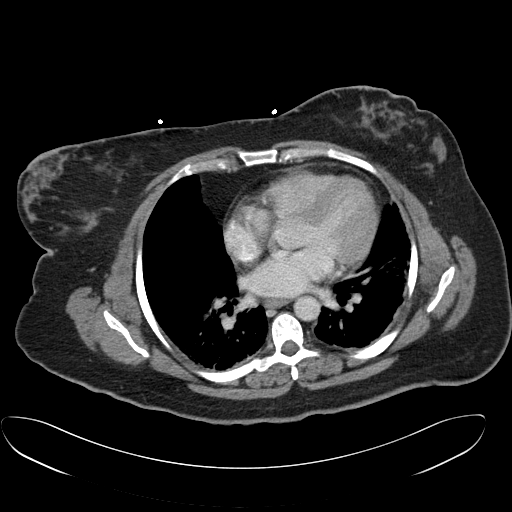
[im 157/164  lung]
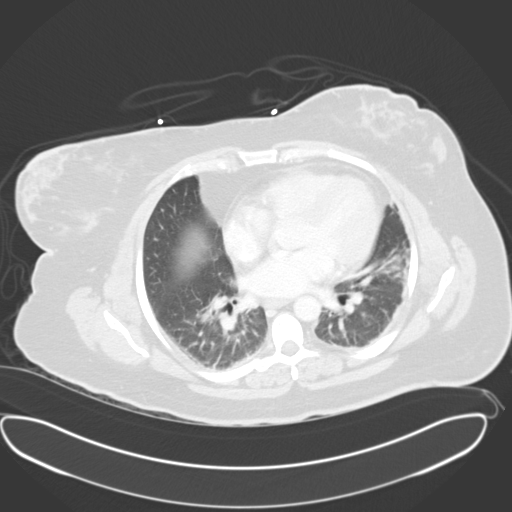

[16 of 32 positions shown; findings below may reference images not displayed]

FINDINGS: Mild basilar opacities likely represent atelectasis.

The liver is diffusely low in attenuation, consistent hepatic steatosis.
Surgical clips are seen from prior cholecystectomy. The spleen, adrenals,
and pancreas are unremarkable. The kidneys enhance normally.

The small and large bowel are normal in caliber. The patient status post
hysterectomy. The appendix is normal.

No aggressive lytic or sclerotic osseous lesions identified.
IMPRESSION: No acute findings in the abdomen or pelvis.

## 2012-11-29 IMAGING — CR DG CHEST 2V
1 series · 2 of 2 positions shown · non-contrast
Comparison: none

REASON FOR EXAM: fever, shortness of breath      Flex 8
COMMENTS:   LMP: Post Hysterectomy

[Series 1: view not recorded · 0.17mm/px · 2 of 2 slices shown]
[im 1/2]
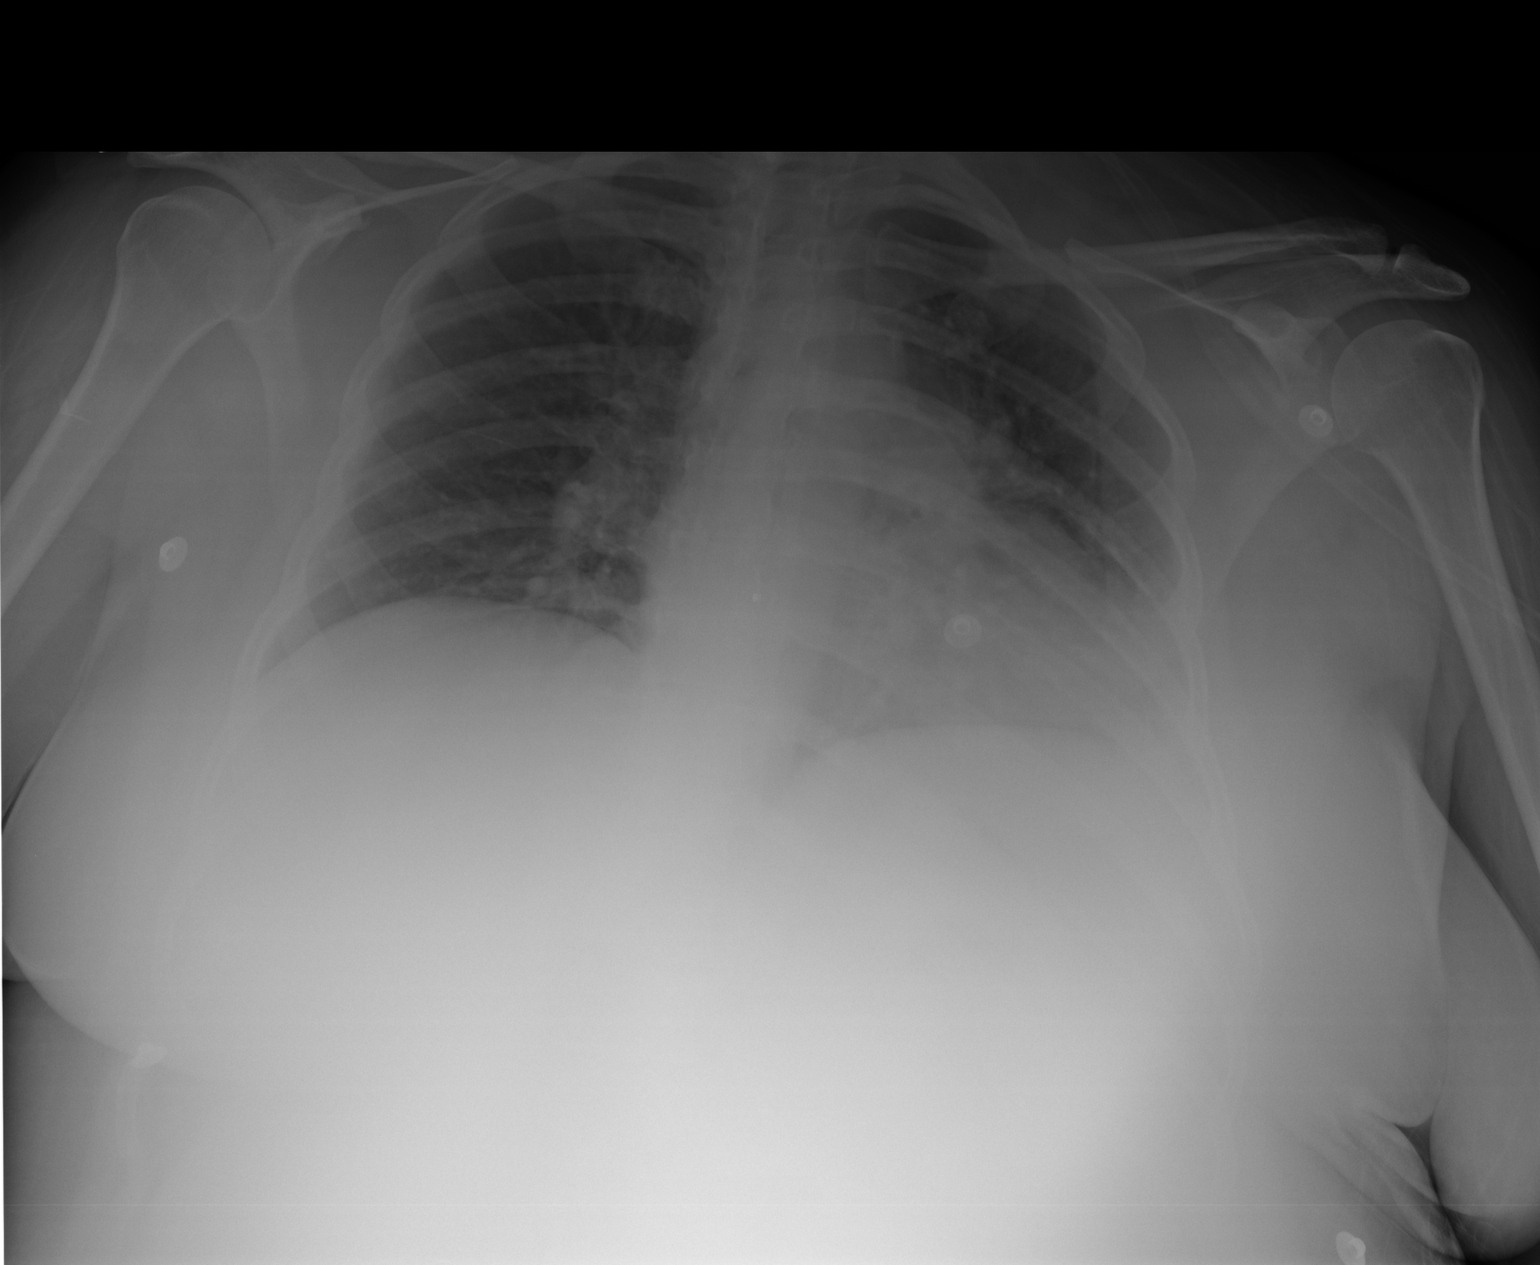
[im 2/2]
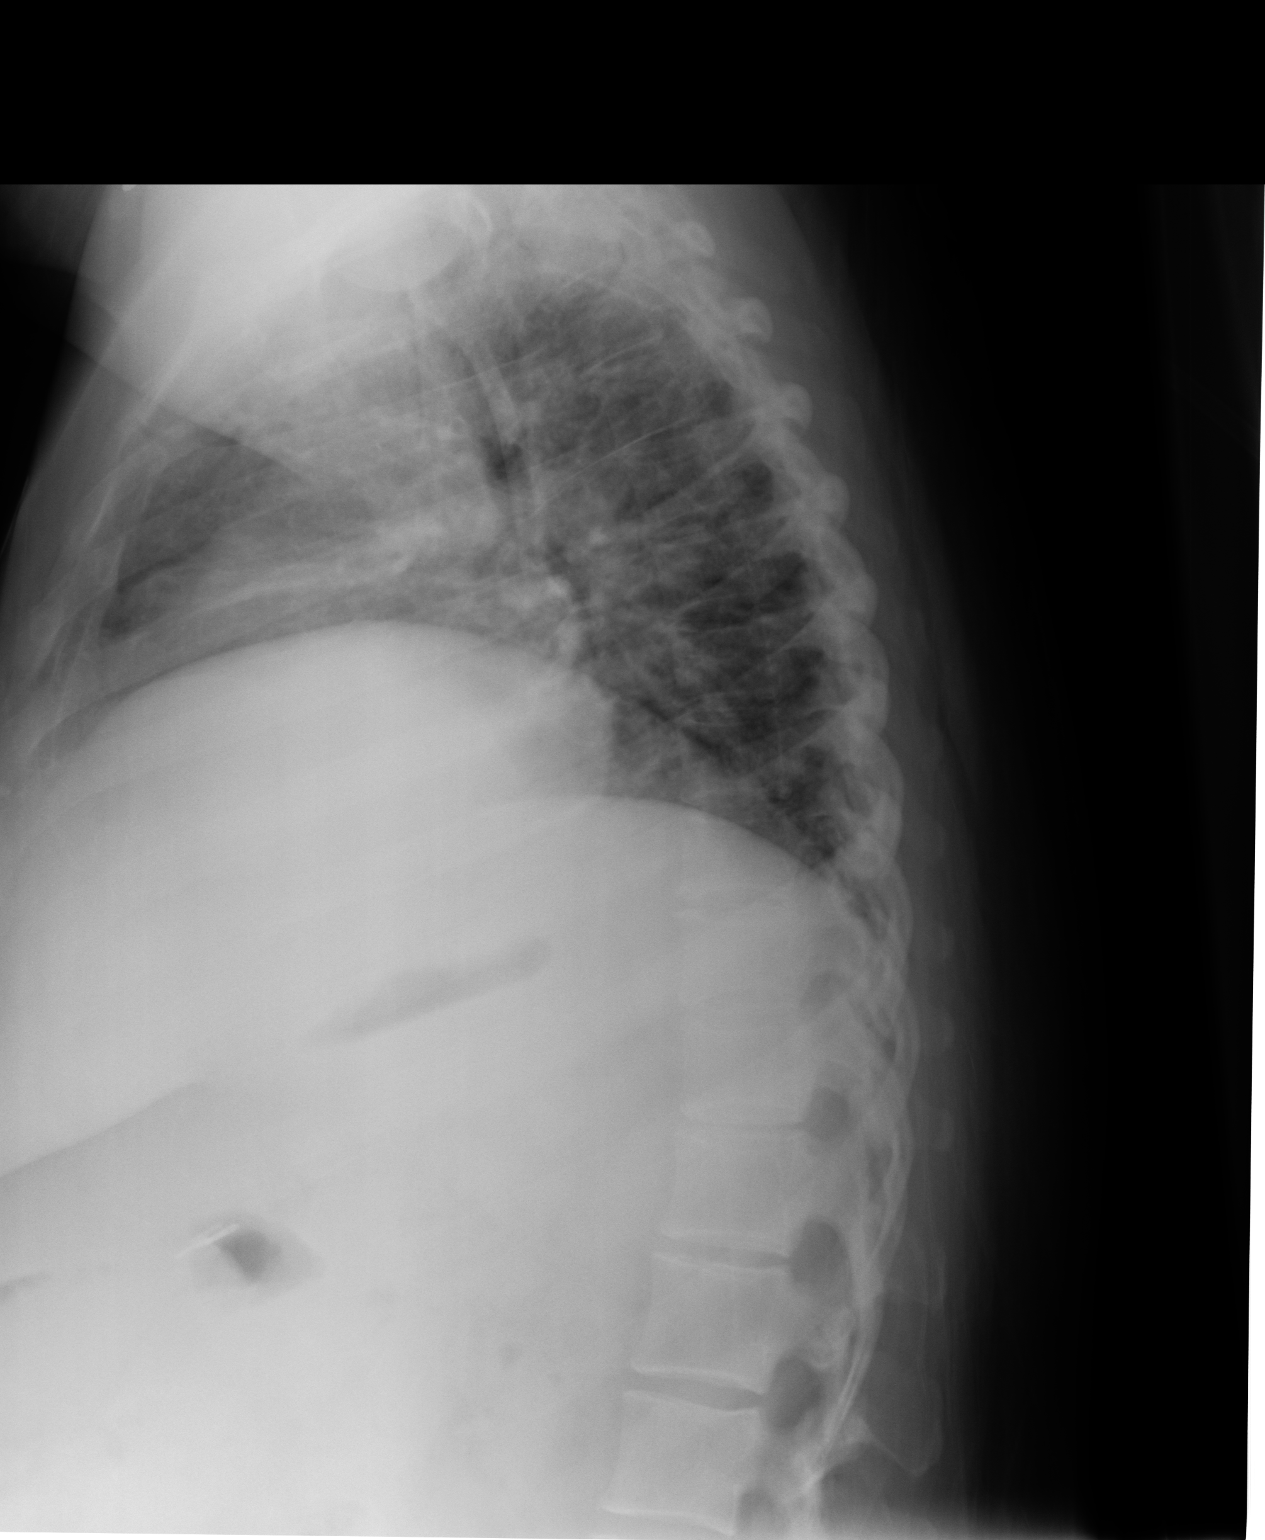

[2 of 2 positions shown; findings below may reference images not displayed]

PROCEDURE:     DXR - DXR CHEST PA (OR AP) AND LATERAL  - April 28, 2011 [DATE]

RESULT:     Comparison is made to the prior exam of 04/10/2011.

The inspiratory level is suboptimal. There is haziness at the left base
consistent with the lack of deep inspiration. No definite pneumonia,
pneumothorax or pleural effusion is seen. The heart size is normal.
IMPRESSION: 1.  No acute changes are identified.
2.  In the AP view, there is observed haziness at the left base which is
thought to be due to the lack of deep inspiration and to the slight chest
rotation that is present.
3.  Follow-up is recommended, if symptomatology persists.

## 2012-11-29 IMAGING — CR DG CHEST 1V PORT
1 series · 1 of 1 positions shown · non-contrast
Comparison: none

REASON FOR EXAM: dyspnea
COMMENTS:

[view not recorded]
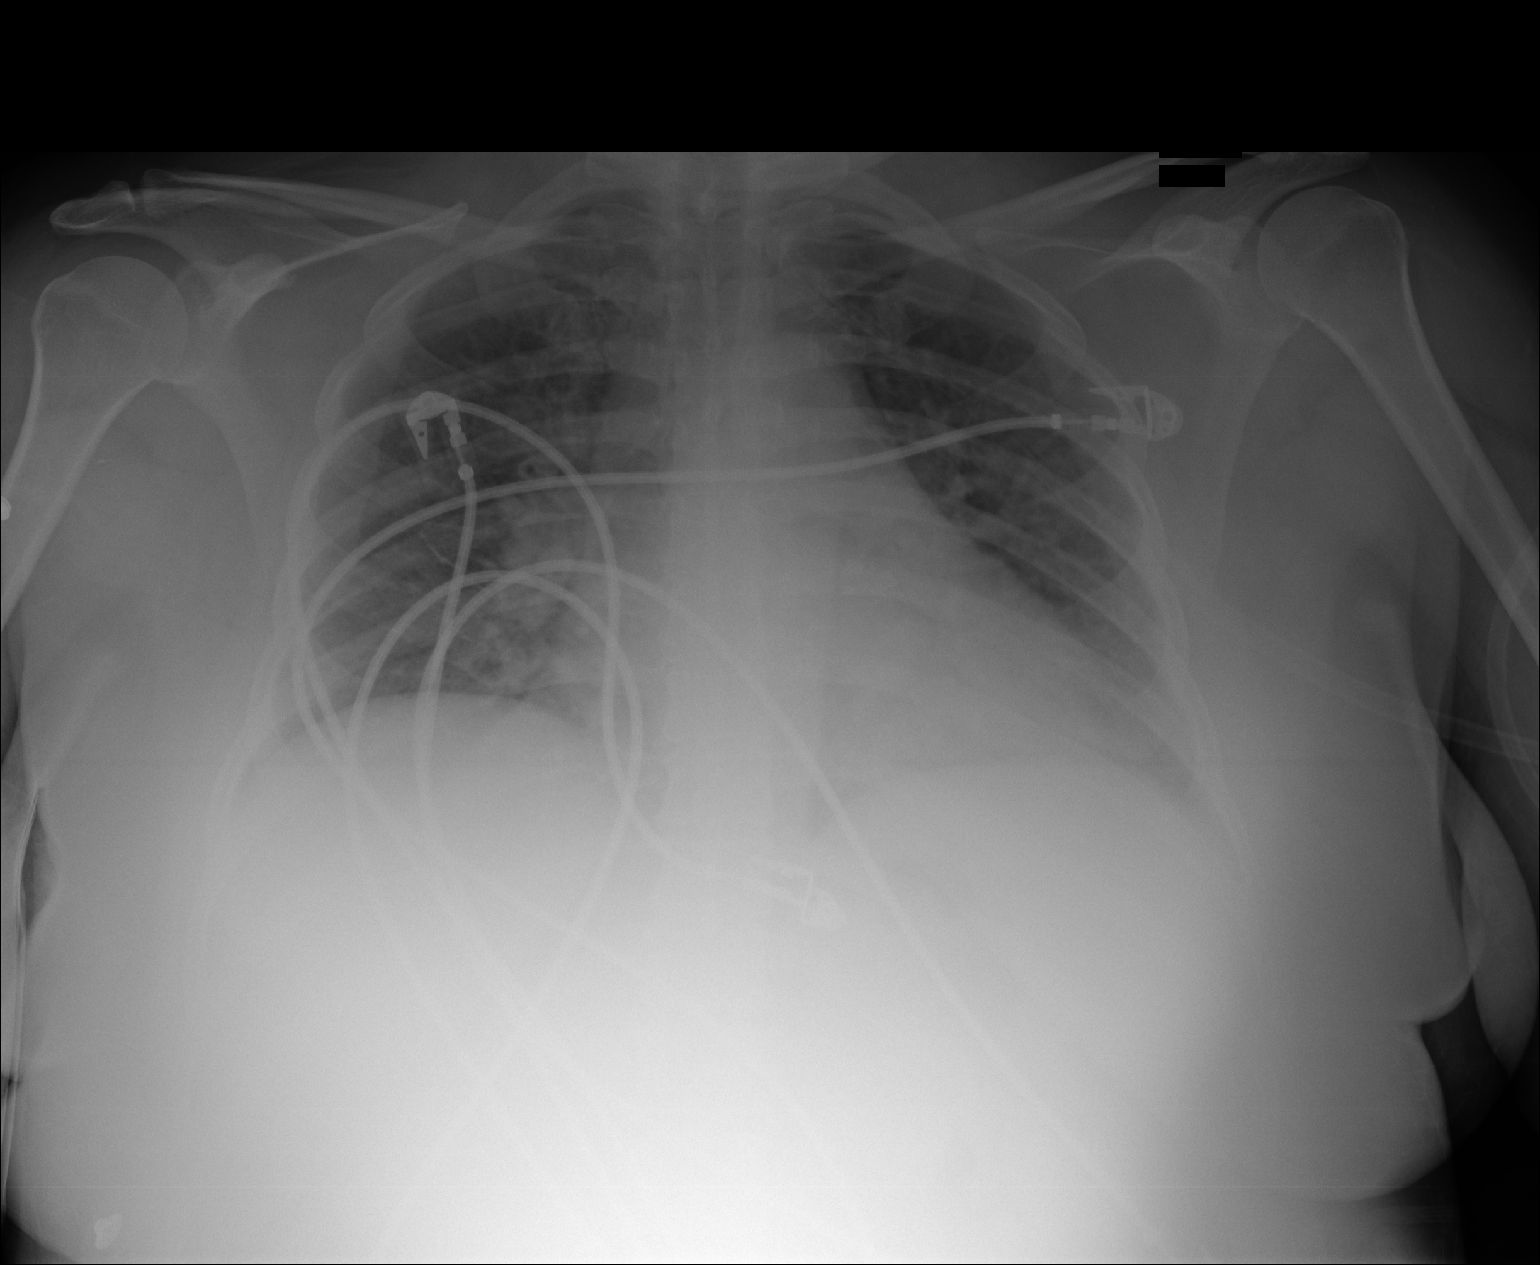

[1 of 1 positions shown; findings below may reference images not displayed]

PROCEDURE:     DXR - DXR PORTABLE CHEST SINGLE VIEW  - April 28, 2011  [DATE]

RESULT:     Comparison is made to the prior exam of earlier this date.

The inspiratory level is suboptimal. No consolidated pulmonary infiltrates
are seen. The heart is upper limits for normal in size or mildly enlarged
but stable in appearance as compared to multiple prior examinations.
Monitoring electrodes are present.
IMPRESSION: 1.  Suboptimal inspiration.
2.  No acute changes are identified.

## 2012-12-31 LAB — URINALYSIS, COMPLETE
Blood: NEGATIVE
Ph: 6 (ref 4.5–8.0)
Protein: NEGATIVE
RBC,UR: 1 /HPF (ref 0–5)

## 2012-12-31 LAB — COMPREHENSIVE METABOLIC PANEL
Albumin: 3.6 g/dL (ref 3.4–5.0)
Anion Gap: 7 (ref 7–16)
BUN: 14 mg/dL (ref 7–18)
Calcium, Total: 9.1 mg/dL (ref 8.5–10.1)
Co2: 30 mmol/L (ref 21–32)
Creatinine: 0.88 mg/dL (ref 0.60–1.30)
Osmolality: 284 (ref 275–301)
Potassium: 4 mmol/L (ref 3.5–5.1)
SGOT(AST): 38 U/L — ABNORMAL HIGH (ref 15–37)
SGPT (ALT): 38 U/L (ref 12–78)
Sodium: 139 mmol/L (ref 136–145)
Total Protein: 6.9 g/dL (ref 6.4–8.2)

## 2012-12-31 LAB — CBC
HCT: 38 % (ref 35.0–47.0)
HGB: 12.8 g/dL (ref 12.0–16.0)
MCH: 30.8 pg (ref 26.0–34.0)
MCHC: 33.7 g/dL (ref 32.0–36.0)
MCV: 92 fL (ref 80–100)
Platelet: 288 10*3/uL (ref 150–440)
RDW: 14.4 % (ref 11.5–14.5)
WBC: 6.4 10*3/uL (ref 3.6–11.0)

## 2012-12-31 LAB — DRUG SCREEN, URINE
Cannabinoid 50 Ng, Ur ~~LOC~~: NEGATIVE (ref ?–50)
Methadone, Ur Screen: NEGATIVE (ref ?–300)
Opiate, Ur Screen: NEGATIVE (ref ?–300)

## 2012-12-31 LAB — TSH: Thyroid Stimulating Horm: 1.33 u[IU]/mL

## 2012-12-31 LAB — ETHANOL: Ethanol %: <0.003 % (ref 0.000–0.080)

## 2013-01-01 ENCOUNTER — Inpatient Hospital Stay: Payer: Self-pay | Admitting: Psychiatry

## 2013-01-02 LAB — VALPROIC ACID LEVEL: Valproic Acid: 57 ug/mL

## 2013-01-18 ENCOUNTER — Emergency Department: Payer: Self-pay | Admitting: Internal Medicine

## 2013-01-23 IMAGING — CR DG CHEST 2V
1 series · 2 of 2 positions shown · non-contrast
Comparison: none

REASON FOR EXAM: cough
COMMENTS:

PROCEDURE:     DXR - DXR CHEST PA (OR AP) AND LATERAL  - June 22, 2011 [DATE]
RESULT:     The lung fields are clear. No pneumonia, pneumothorax or pleural
effusion is seen. Heart size is normal. The mediastinal and osseous
structures show no significant abnormalities.

[Series 3: w chest pa · 0.14mm/px · 2 of 2 slices shown]
[im 1/2]
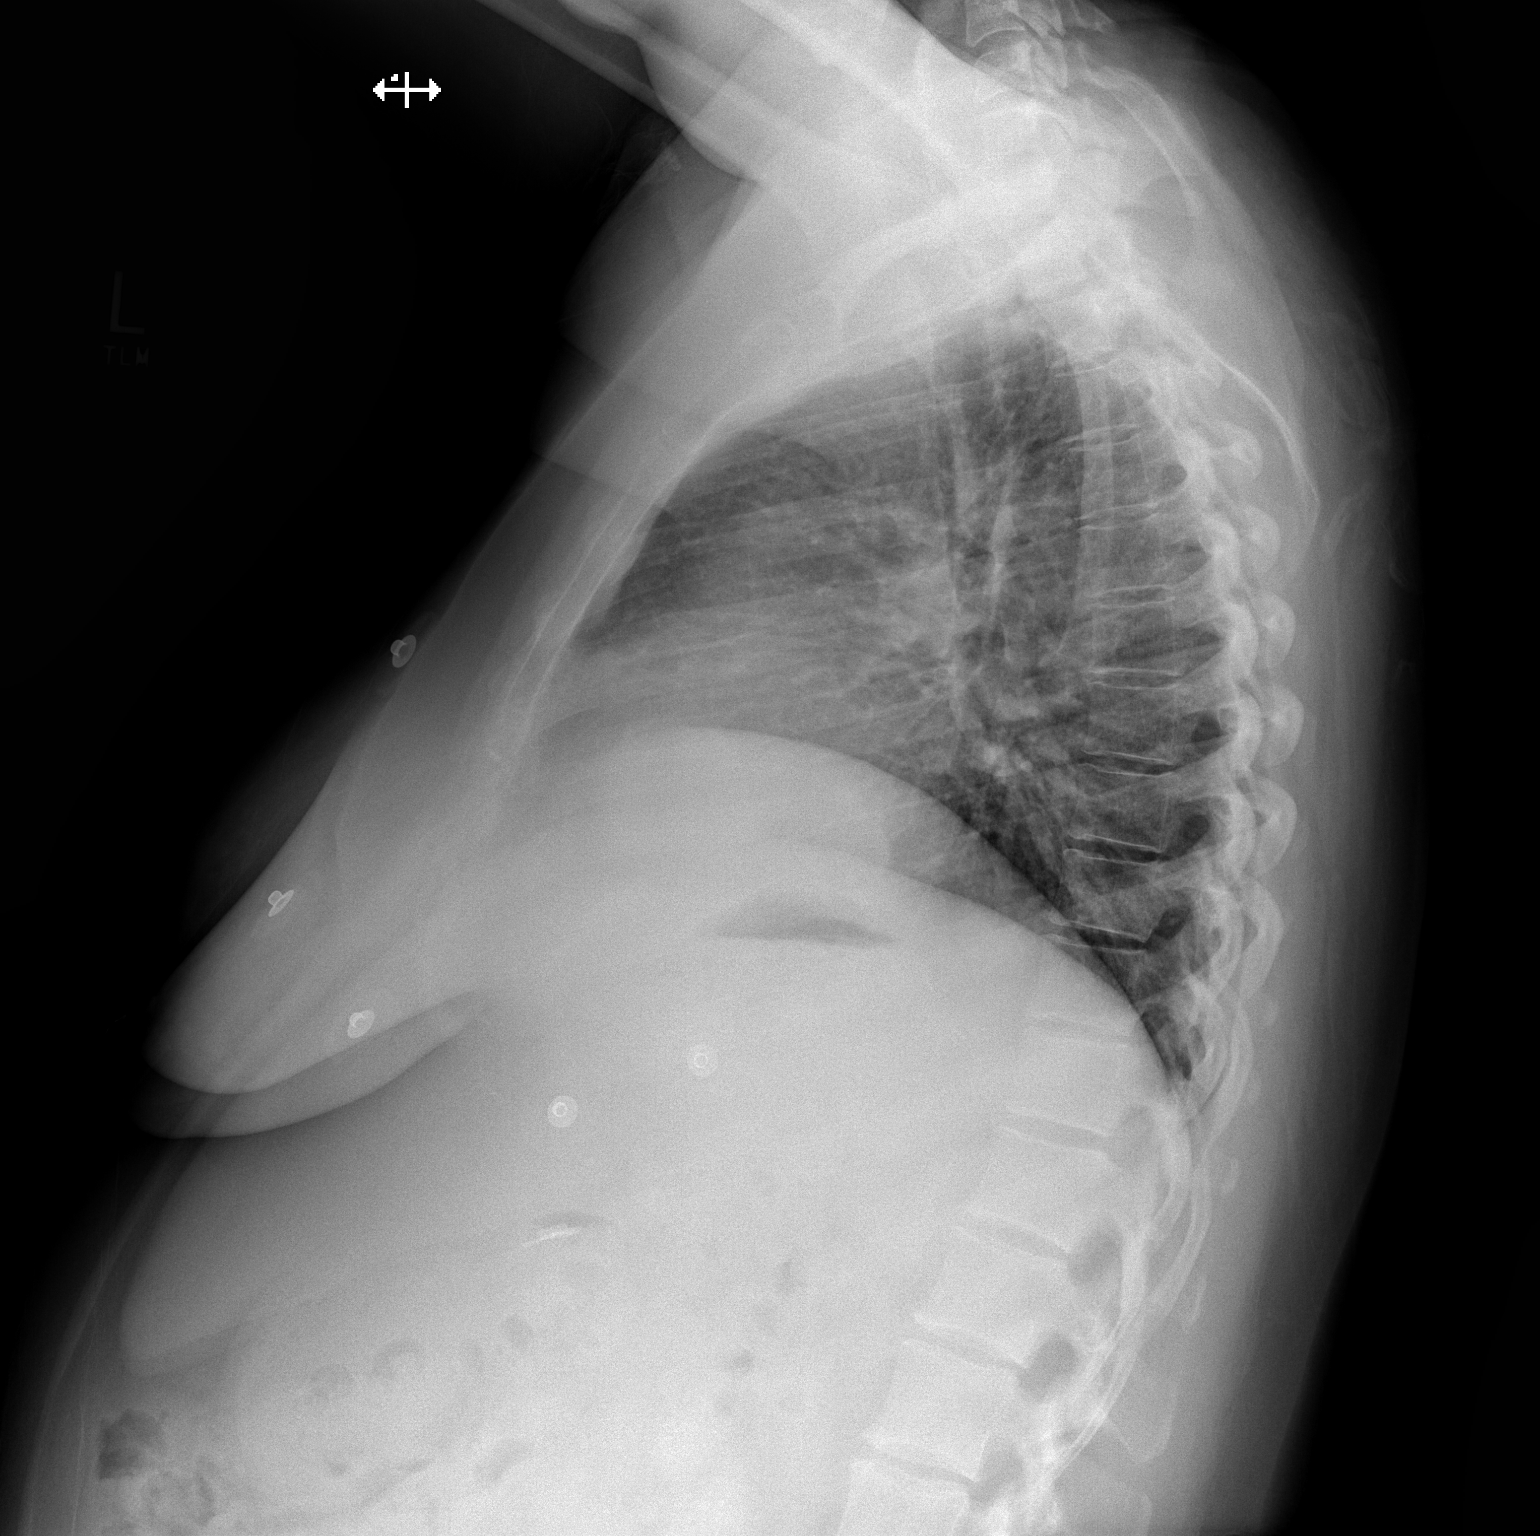
[im 2/2]
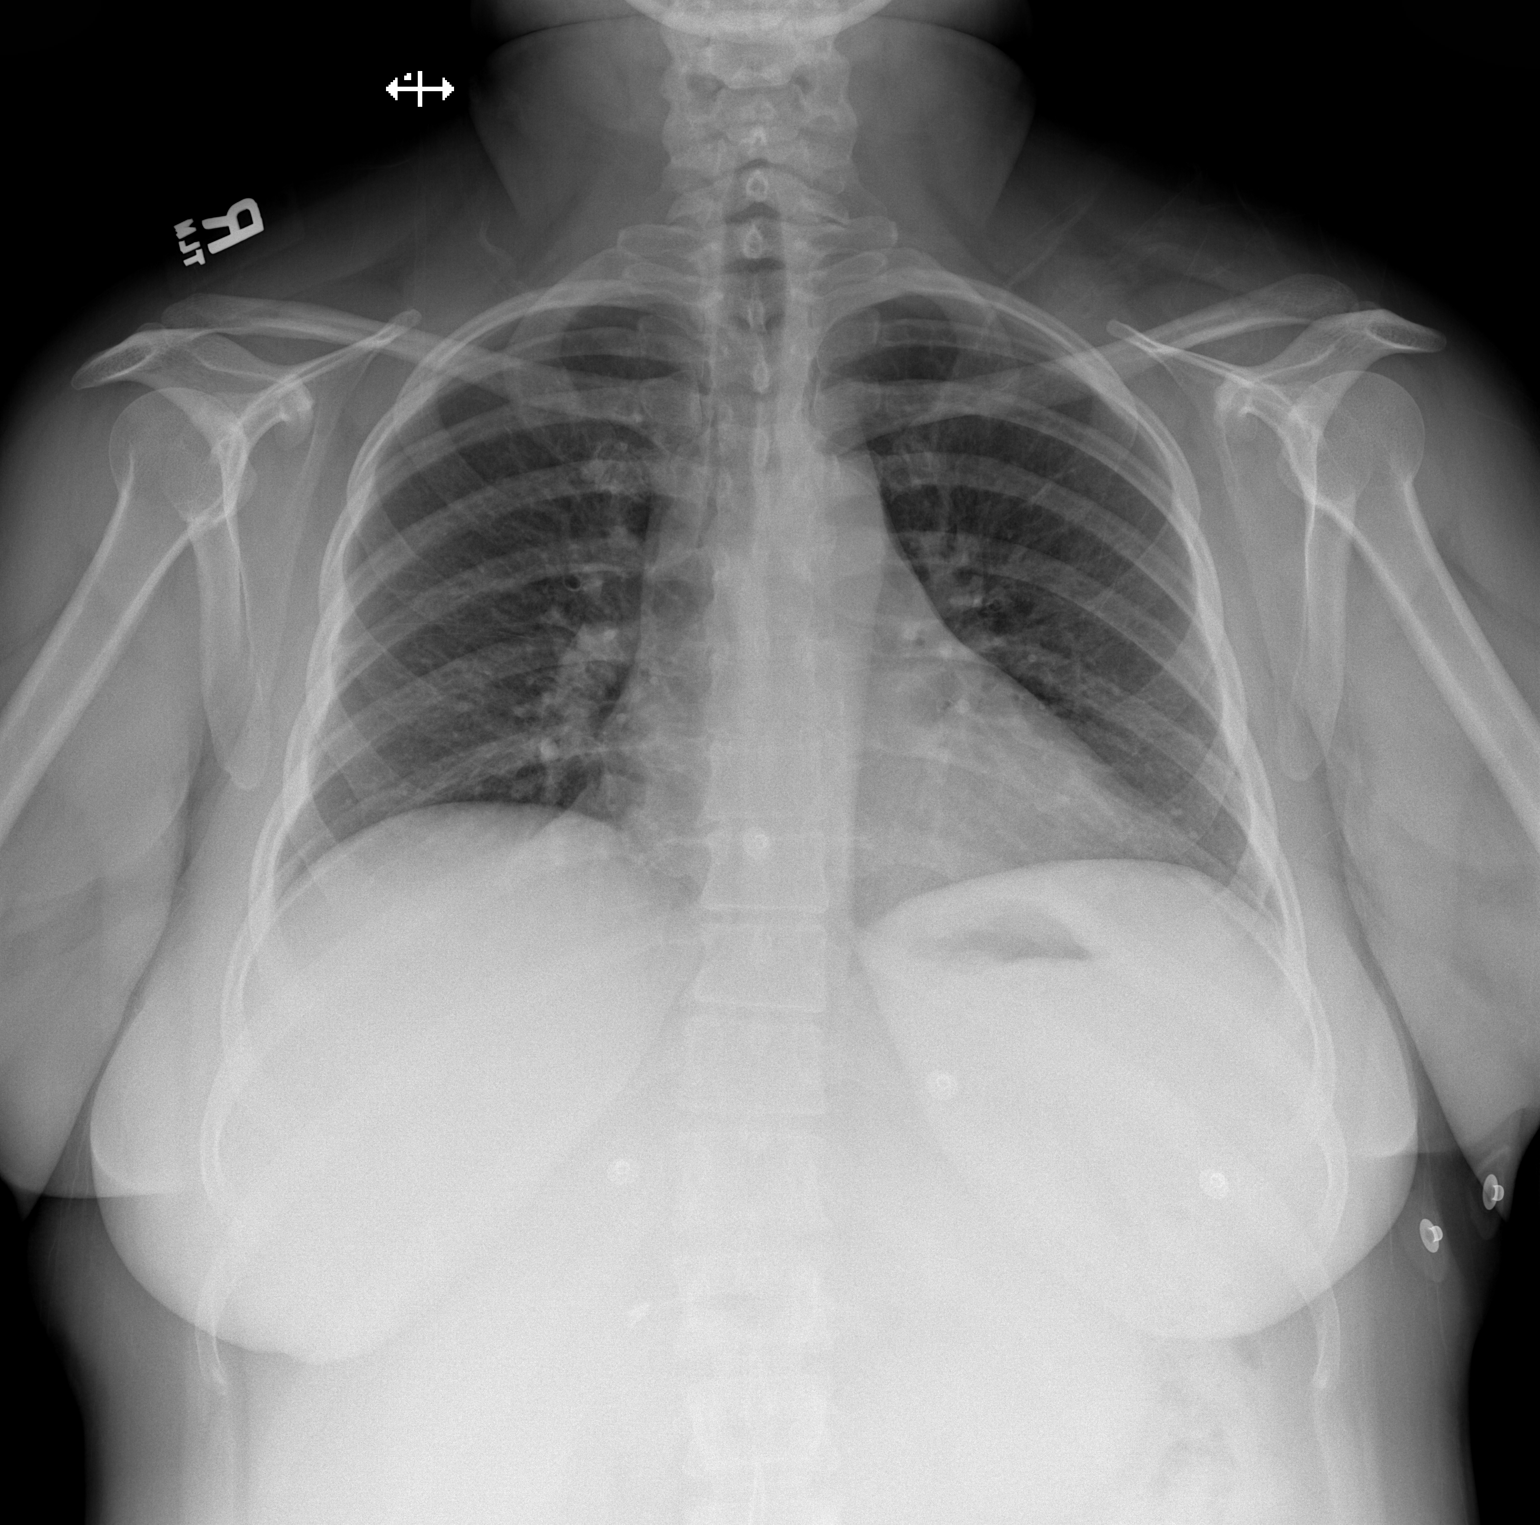

[2 of 2 positions shown; findings below may reference images not displayed]

IMPRESSION: No acute changes are identified.

## 2013-02-09 IMAGING — CR DG CHEST 2V
1 series · 2 of 2 positions shown · non-contrast
Comparison: none

REASON FOR EXAM: sob
COMMENTS:

PROCEDURE:     DXR - DXR CHEST PA (OR AP) AND LATERAL  - July 09, 2011 [DATE]
RESULT:     The lungs are clear. The cardiac silhouette and visualized bony
skeleton are unremarkable.

[Series 7: w chest pa · 0.14mm/px · 2 of 2 slices shown]
[im 1/2]
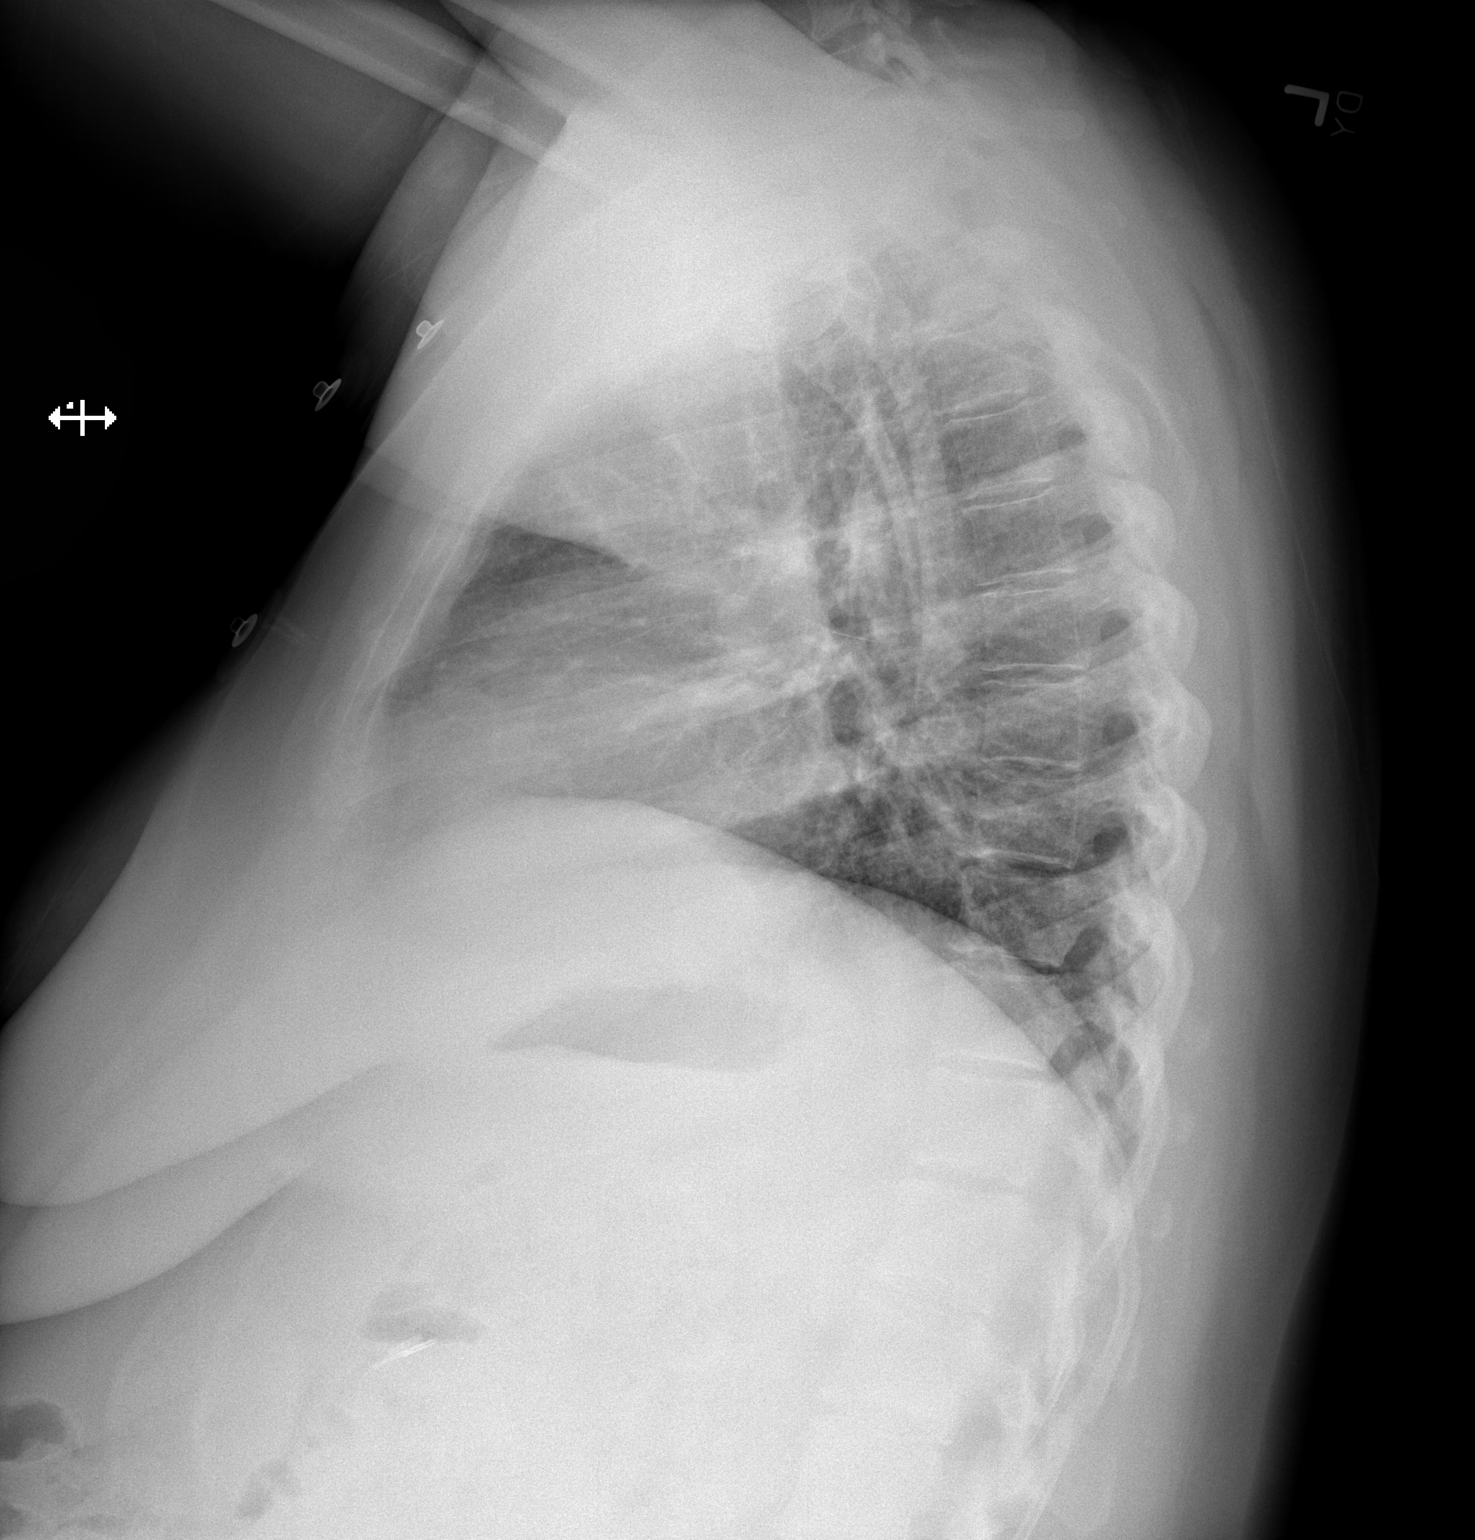
[im 2/2]
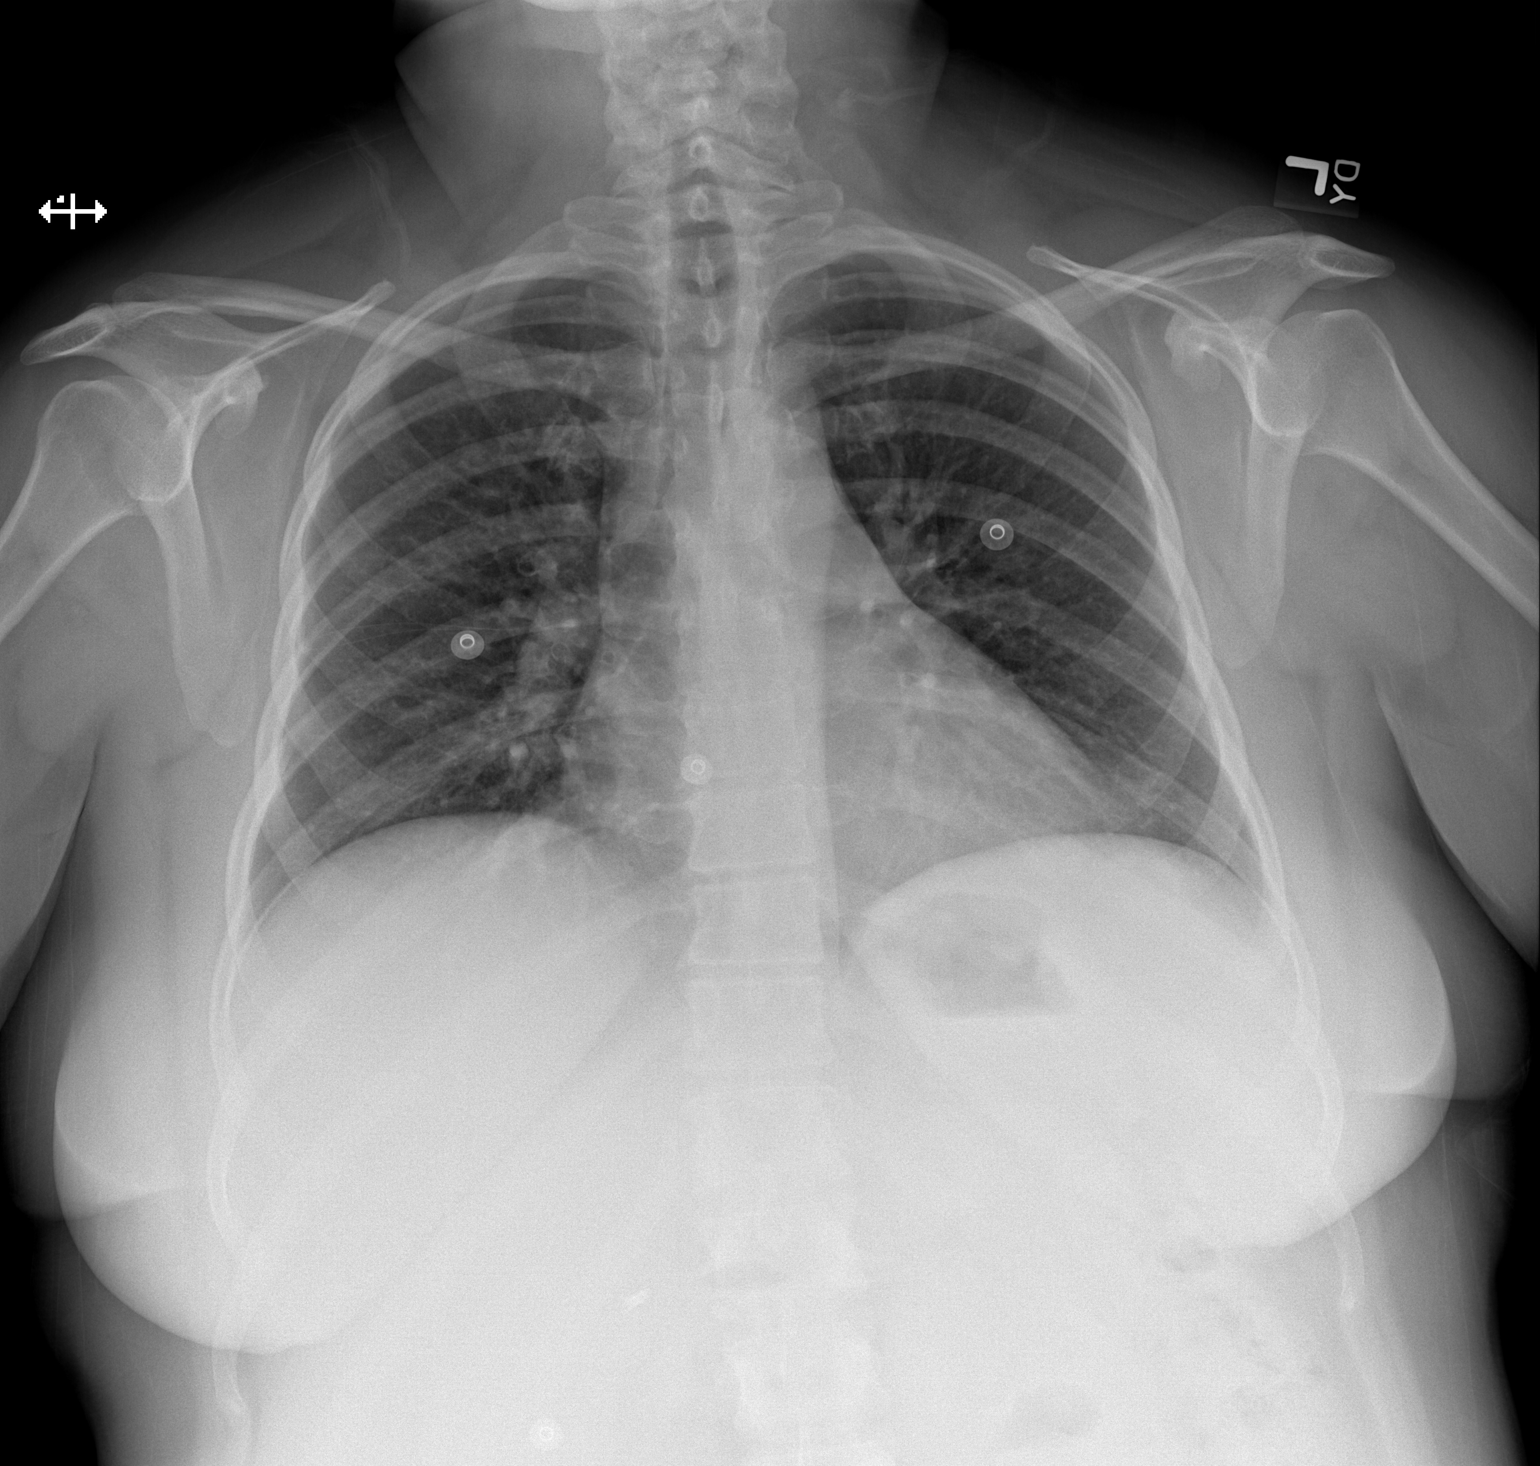

[2 of 2 positions shown; findings below may reference images not displayed]

IMPRESSION: 1. Chest radiograph without evidence of acute cardiopulmonary disease.
2. A comparison made to prior study dated 06/22/2011.

## 2013-03-31 ENCOUNTER — Inpatient Hospital Stay: Payer: Self-pay | Admitting: Internal Medicine

## 2013-03-31 LAB — COMPREHENSIVE METABOLIC PANEL
Albumin: 3 g/dL — ABNORMAL LOW (ref 3.4–5.0)
Alkaline Phosphatase: 38 U/L — ABNORMAL LOW (ref 50–136)
Anion Gap: 4 — ABNORMAL LOW (ref 7–16)
BUN: 19 mg/dL — ABNORMAL HIGH (ref 7–18)
Bilirubin,Total: 0.3 mg/dL (ref 0.2–1.0)
Calcium, Total: 8.8 mg/dL (ref 8.5–10.1)
EGFR (African American): >60
EGFR (Non-African Amer.): 58 — ABNORMAL LOW
Osmolality: 276 (ref 275–301)
Potassium: 4.3 mmol/L (ref 3.5–5.1)
SGPT (ALT): 37 U/L (ref 12–78)
Sodium: 137 mmol/L (ref 136–145)
Total Protein: 5.9 g/dL — ABNORMAL LOW (ref 6.4–8.2)

## 2013-03-31 LAB — CBC
HCT: 34.4 % — ABNORMAL LOW (ref 35.0–47.0)
MCH: 30.9 pg (ref 26.0–34.0)
MCHC: 33.4 g/dL (ref 32.0–36.0)
MCV: 93 fL (ref 80–100)
Platelet: 307 10*3/uL (ref 150–440)
RBC: 3.72 10*6/uL — ABNORMAL LOW (ref 3.80–5.20)
RDW: 14.5 % (ref 11.5–14.5)
WBC: 7.2 10*3/uL (ref 3.6–11.0)

## 2013-03-31 LAB — DRUG SCREEN, URINE
Amphetamines, Ur Screen: NEGATIVE (ref ?–1000)
Barbiturates, Ur Screen: NEGATIVE (ref ?–200)
Benzodiazepine, Ur Scrn: POSITIVE (ref ?–200)
Cocaine Metabolite,Ur ~~LOC~~: NEGATIVE (ref ?–300)
Methadone, Ur Screen: NEGATIVE (ref ?–300)
Tricyclic, Ur Screen: NEGATIVE (ref ?–1000)

## 2013-03-31 LAB — URINALYSIS, COMPLETE
Bacteria: NONE SEEN
Bilirubin,UR: NEGATIVE
Blood: NEGATIVE
Hyaline Cast: 3
RBC,UR: 3 /HPF (ref 0–5)
Specific Gravity: 1.024 (ref 1.003–1.030)
Squamous Epithelial: 15
WBC UR: 21 /HPF (ref 0–5)

## 2013-03-31 LAB — TROPONIN I: Troponin-I: <0.02 ng/mL

## 2013-03-31 LAB — TSH: Thyroid Stimulating Horm: 1.14 u[IU]/mL

## 2013-03-31 LAB — AMMONIA: Ammonia, Plasma: 99 mcmol/L — ABNORMAL HIGH (ref 11–32)

## 2013-04-01 LAB — URINE CULTURE

## 2013-04-01 LAB — COMPREHENSIVE METABOLIC PANEL
Albumin: 3 g/dL — ABNORMAL LOW (ref 3.4–5.0)
Alkaline Phosphatase: 34 U/L — ABNORMAL LOW (ref 50–136)
Anion Gap: 5 — ABNORMAL LOW (ref 7–16)
BUN: 17 mg/dL (ref 7–18)
Bilirubin,Total: 0.3 mg/dL (ref 0.2–1.0)
Calcium, Total: 8.7 mg/dL (ref 8.5–10.1)
Creatinine: 1.03 mg/dL (ref 0.60–1.30)
EGFR (African American): >60
Potassium: 4.8 mmol/L (ref 3.5–5.1)
SGPT (ALT): 35 U/L (ref 12–78)
Sodium: 137 mmol/L (ref 136–145)

## 2013-04-01 LAB — CBC WITH DIFFERENTIAL/PLATELET
HCT: 34 % — ABNORMAL LOW (ref 35.0–47.0)
HGB: 11.4 g/dL — ABNORMAL LOW (ref 12.0–16.0)
Lymphocytes: 56 %
MCV: 94 fL (ref 80–100)
Metamyelocyte: 1 %
Monocytes: 9 %
Platelet: 266 10*3/uL (ref 150–440)
RDW: 14.5 % (ref 11.5–14.5)
WBC: 6.2 10*3/uL (ref 3.6–11.0)

## 2013-04-01 LAB — AMMONIA: Ammonia, Plasma: 49 mcmol/L — ABNORMAL HIGH (ref 11–32)

## 2013-04-01 LAB — LIPID PANEL
Cholesterol: 133 mg/dL (ref 0–200)
HDL Cholesterol: 10 mg/dL — ABNORMAL LOW (ref 40–60)
Triglycerides: 463 mg/dL — ABNORMAL HIGH (ref 0–200)

## 2013-04-21 IMAGING — CR DG CHEST 1V PORT
1 series · 1 of 1 positions shown · non-contrast
Comparison: none

REASON FOR EXAM: [id number] ?Ra, crackles.
COMMENTS:

PROCEDURE:     DXR - DXR PORTABLE CHEST SINGLE VIEW  - September 18, 2011 [DATE]
RESULT:     Comparison: None

[portable]
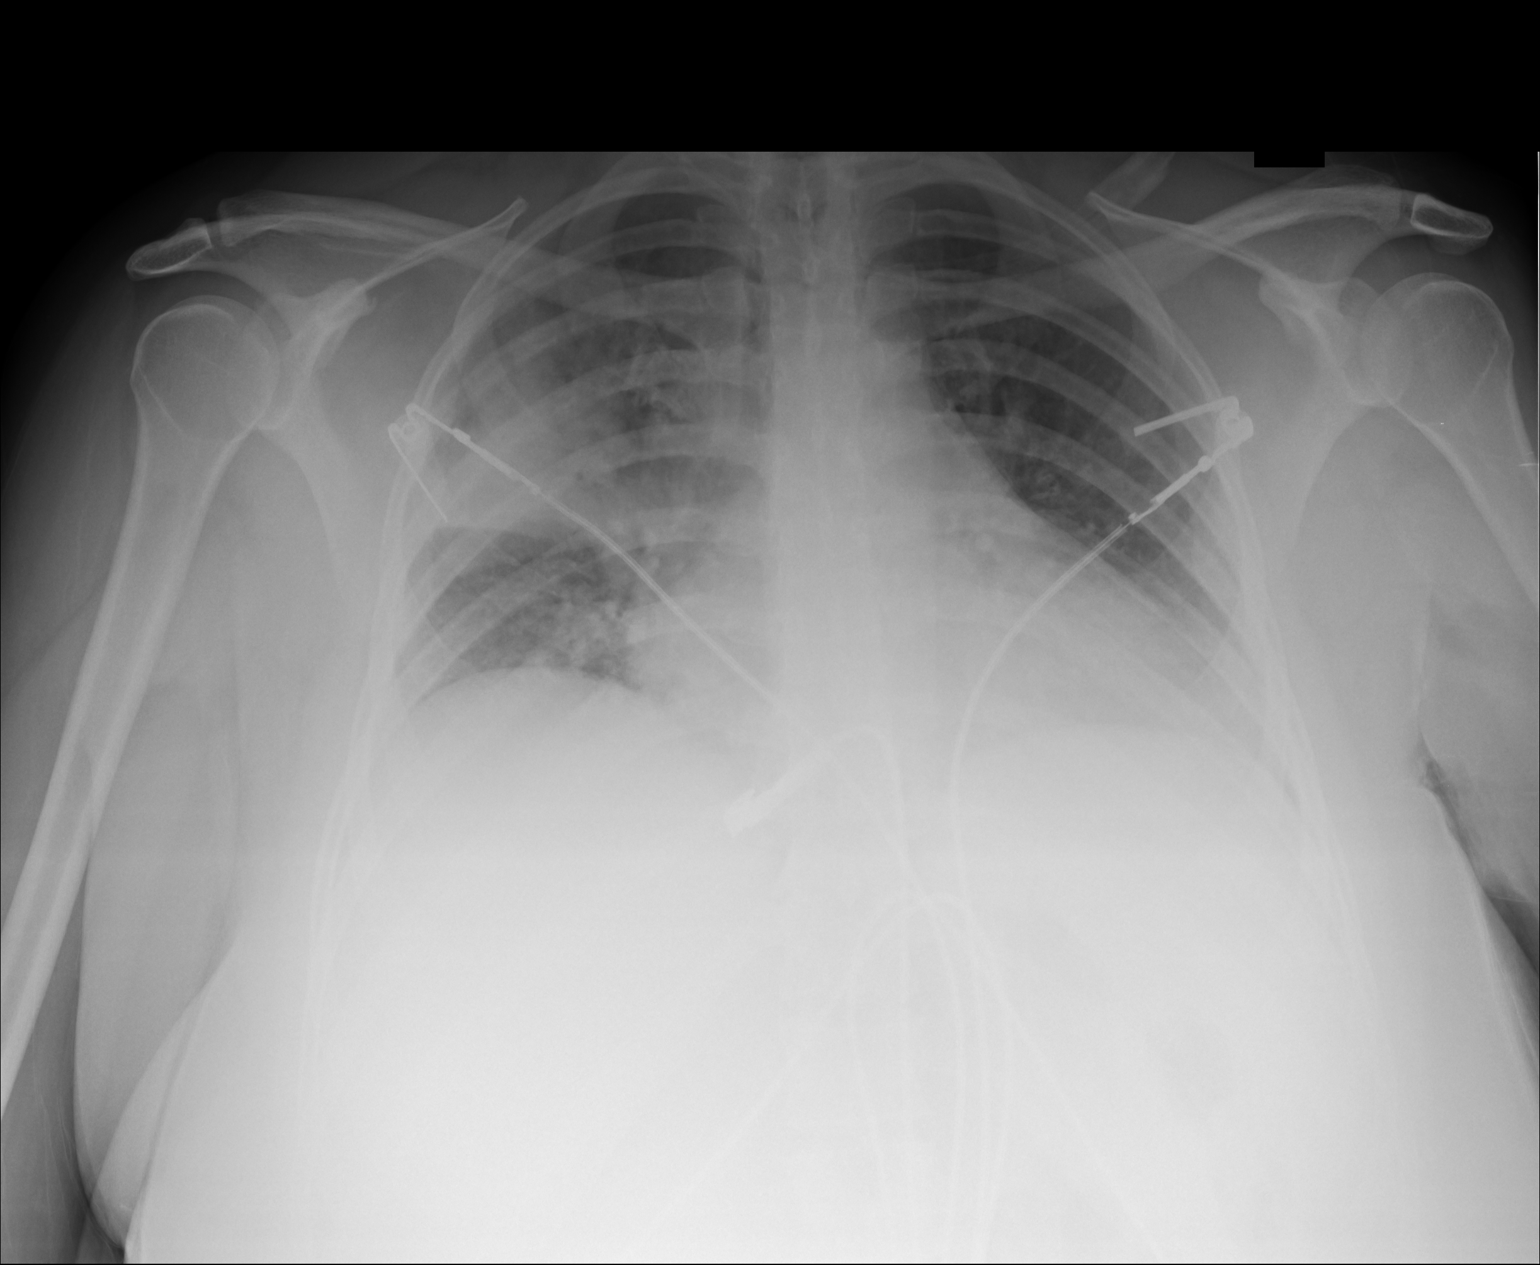

[1 of 1 positions shown; findings below may reference images not displayed]

FINDINGS: Single portable AP chest radiograph is provided. There is right upper lobe
consolidation. There is no pleural effusion or pneumothorax. Normal
cardiomediastinal silhouette. The osseous structures are unremarkable.
IMPRESSION: Right upper lobe pneumonia. Recommend followup radiography to document
complete resolution following adequate medical therapy. If there is not
complete resolution, then recommend further evaluation with CT of the chest
to exclude underlying pathology.

## 2013-04-21 IMAGING — CT CT HEAD WITHOUT CONTRAST
2 series · 16 of 30 positions shown, 20 images · non-contrast
Comparison: none

REASON FOR EXAM: fall times 3, syncope,
COMMENTS:

PROCEDURE:     CT  - CT HEAD WITHOUT CONTRAST  - September 18, 2011  [DATE]
RESULT:     Comparison:  11/16/2009
TECHNIQUE: Multiple axial images from the foramen magnum to the vertex were
obtained without IV contrast.

[Series 4: without · axial · non-contrast · 0.43mm/px · z∈[-218,-102]mm · 13 of 27 slices shown, 17 images]
[im 2/27  brain]
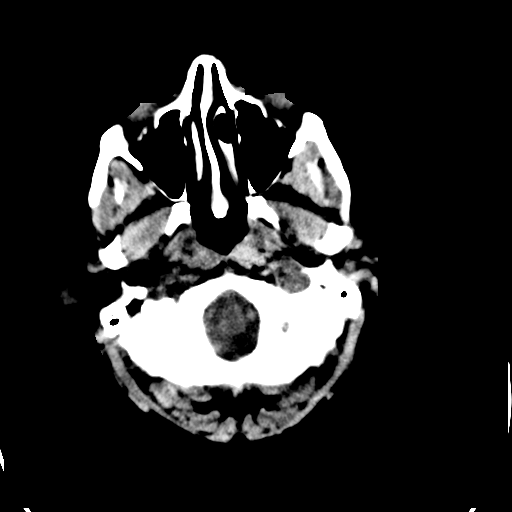
[im 2/27  bone]
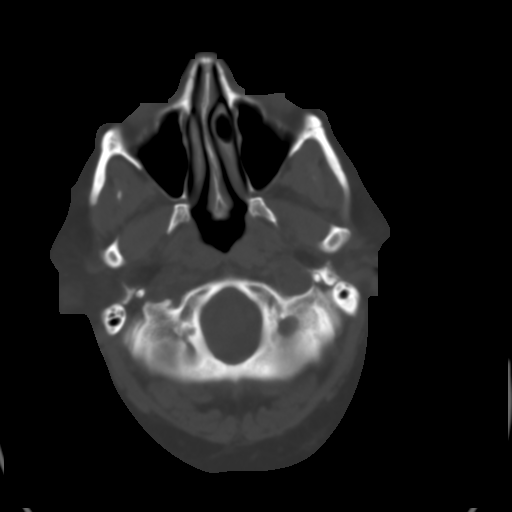
[im 4/27  brain]
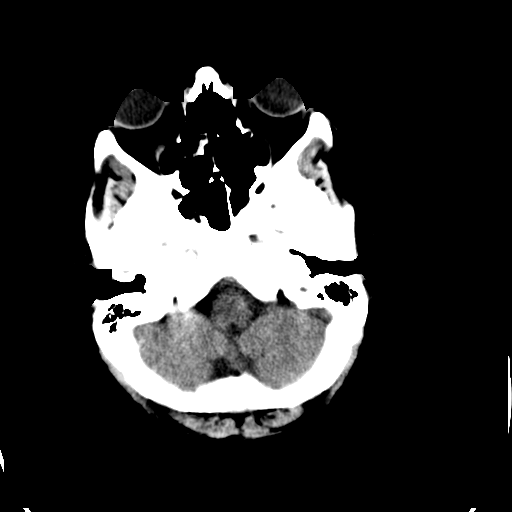
[im 6/27  brain]
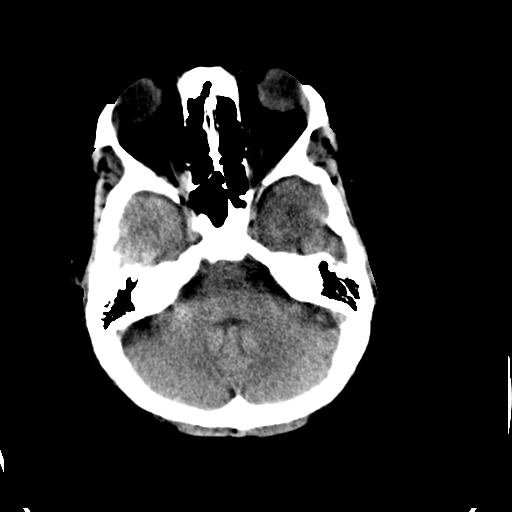
[im 8/27  brain]
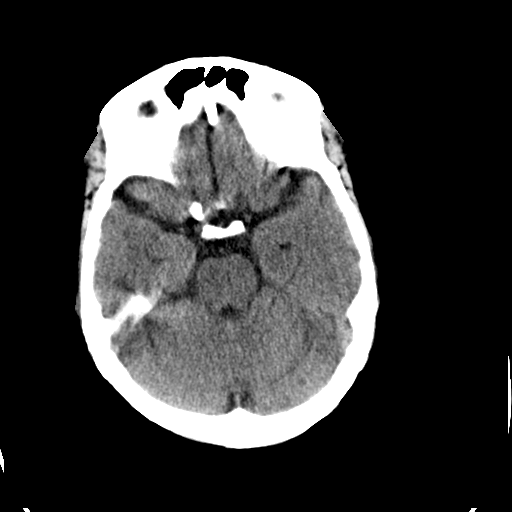
[im 10/27  brain]
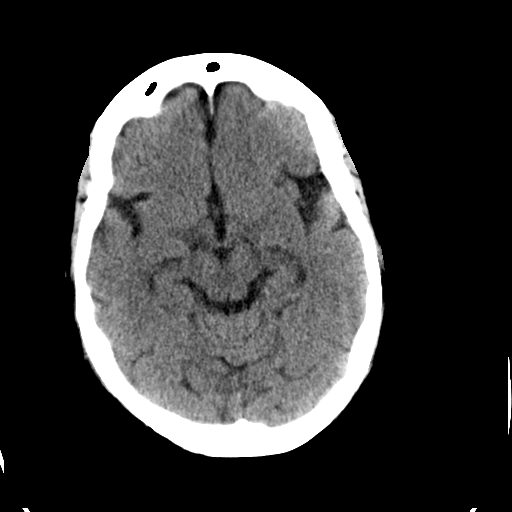
[im 10/27  bone]
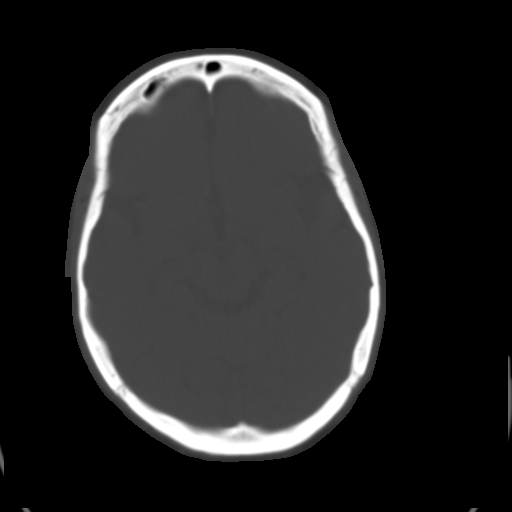
[im 12/27  brain]
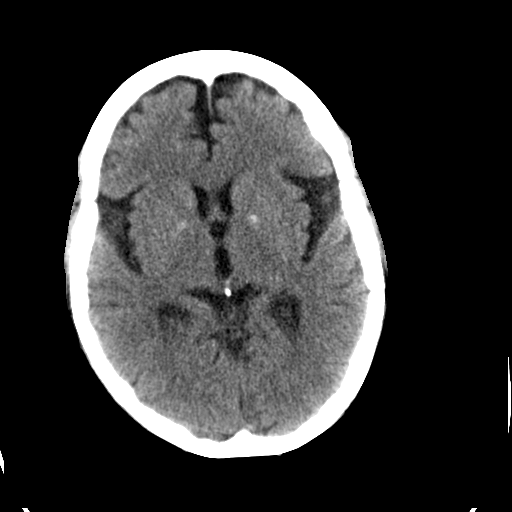
[im 14/27  brain]
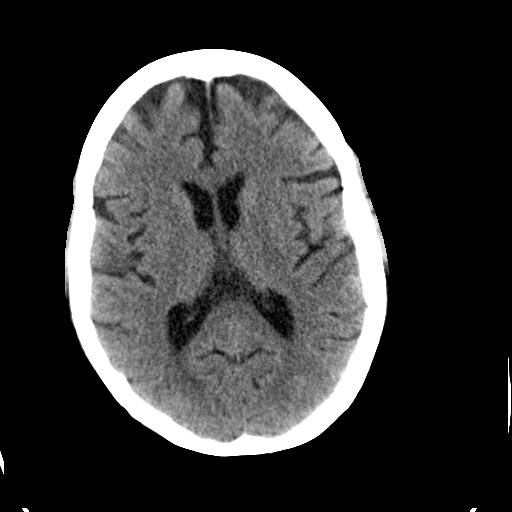
[im 15/27  brain]
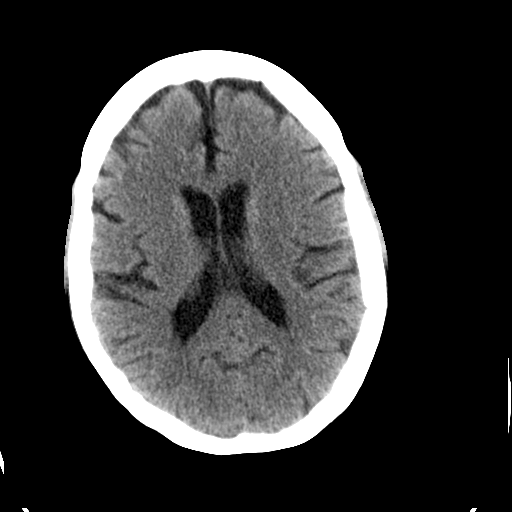
[im 17/27  brain]
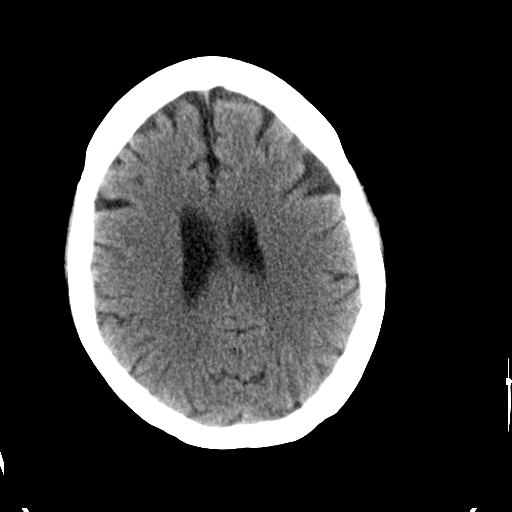
[im 17/27  bone]
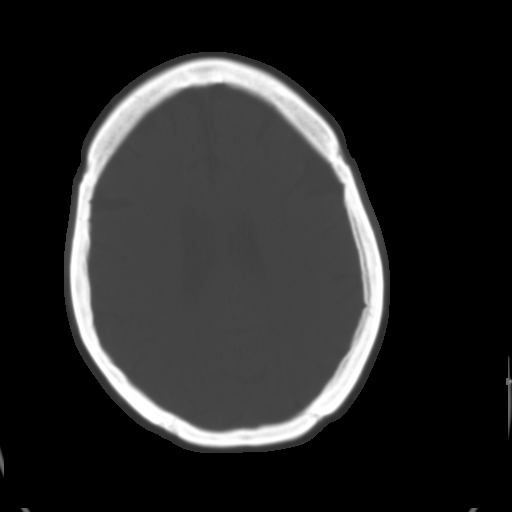
[im 19/27  brain]
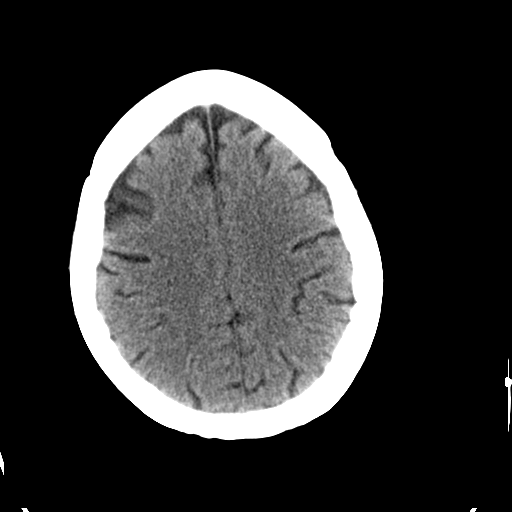
[im 21/27  brain]
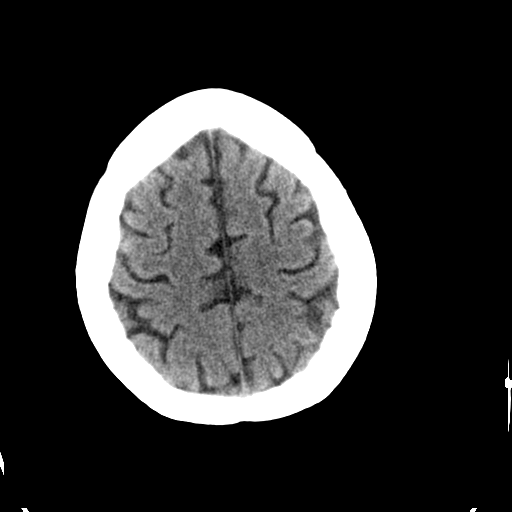
[im 23/27  brain]
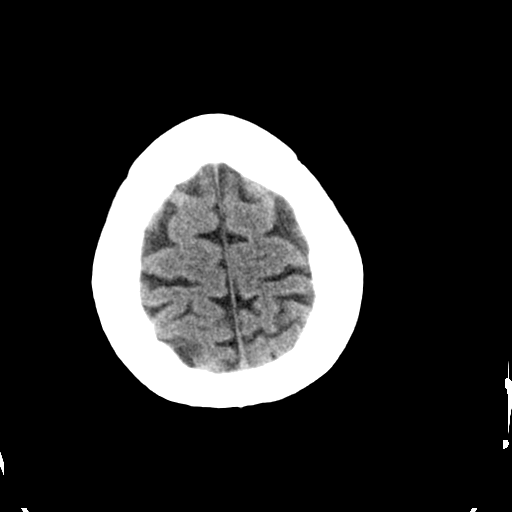
[im 25/27  brain]
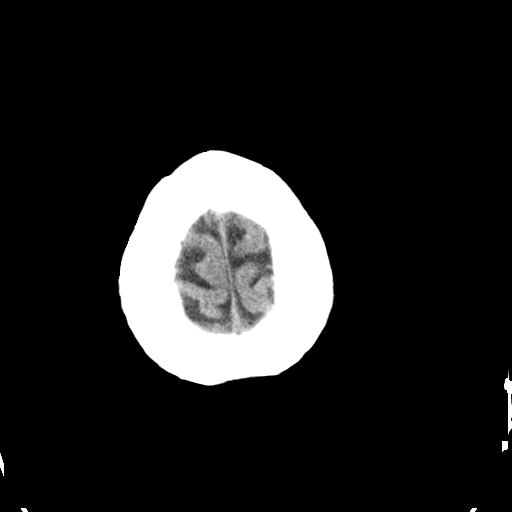
[im 25/27  bone]
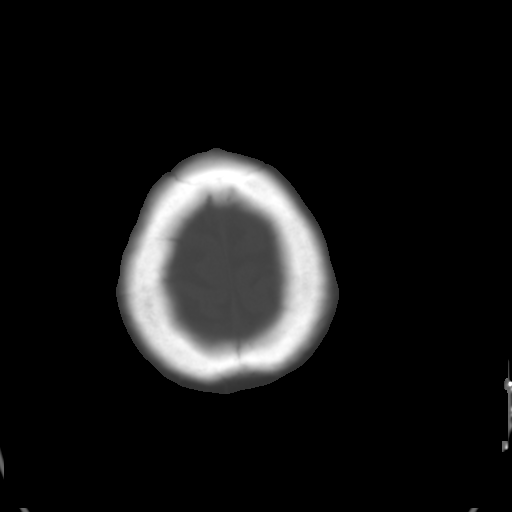

[Series 5: bone · axial · 0.43mm/px · z∈[-218,-178]mm · 3 of 27 slices shown]
[im 2/27  bone]
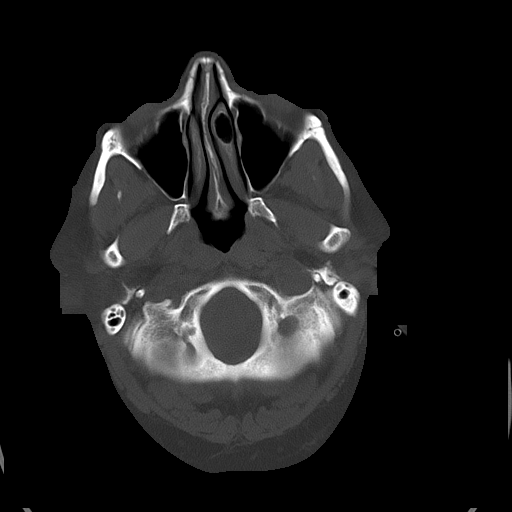
[im 6/27  bone]
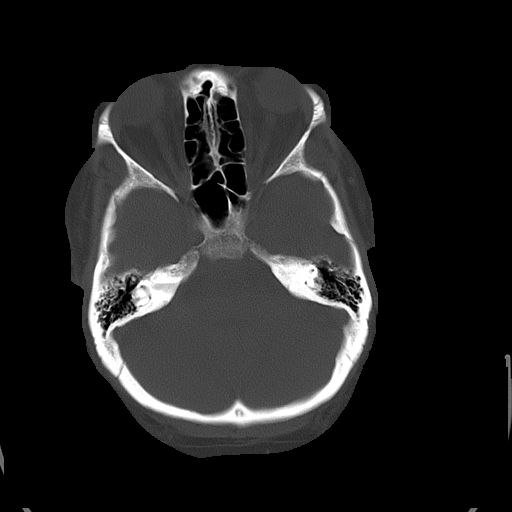
[im 10/27  bone]
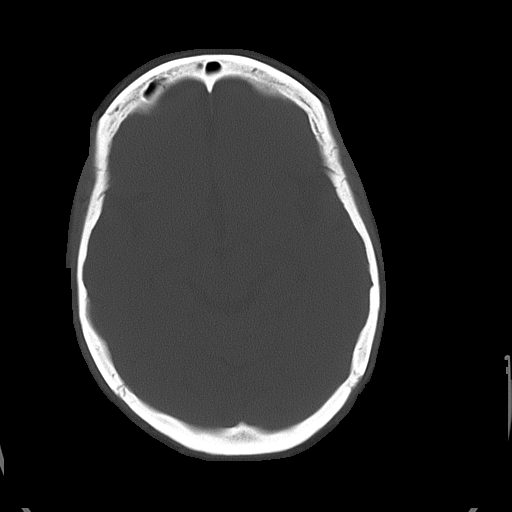

[16 of 30 positions shown; findings below may reference images not displayed]

FINDINGS: There is no evidence for mass effect, midline shift, or extra-axial fluid
collections. There is no evidence for space-occupying lesion, intracranial
hemorrhage, or cortical-based area of infarction. There are nonspecific
calcifications in the basal ganglia.

The osseous structures are unremarkable.
IMPRESSION: No acute intracranial process.

## 2013-04-23 ENCOUNTER — Emergency Department: Payer: Self-pay | Admitting: Emergency Medicine

## 2013-04-25 IMAGING — CR DG CHEST 2V
1 series · 2 of 2 positions shown · non-contrast
Comparison: none

REASON FOR EXAM: pneumonia follwo up
COMMENTS:

PROCEDURE:     DXR - DXR CHEST PA (OR AP) AND LATERAL  - September 22, 2011  [DATE]
RESULT:     Comparison: 09/18/2011

[Series 1: w chest pa · 0.14mm/px · 2 of 2 slices shown]
[im 1/2]
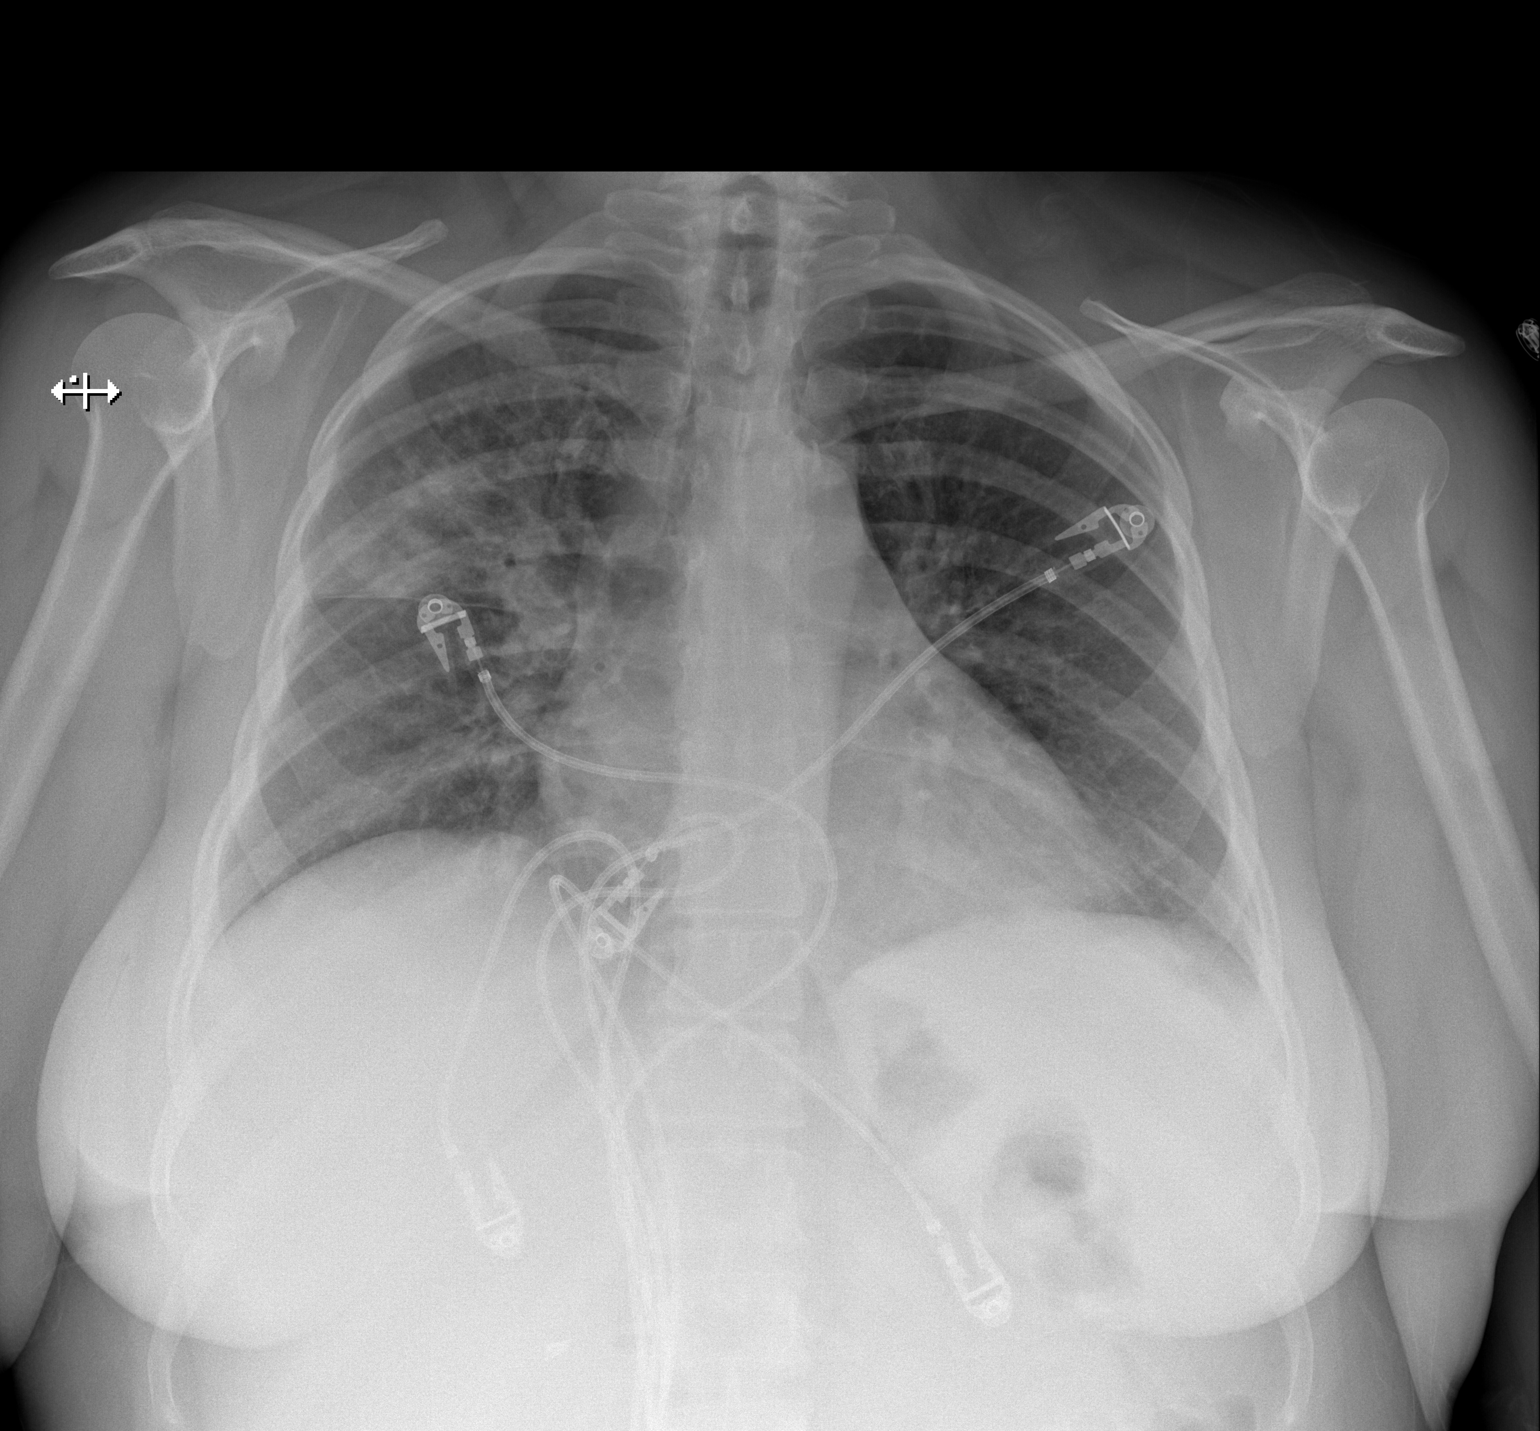
[im 2/2]
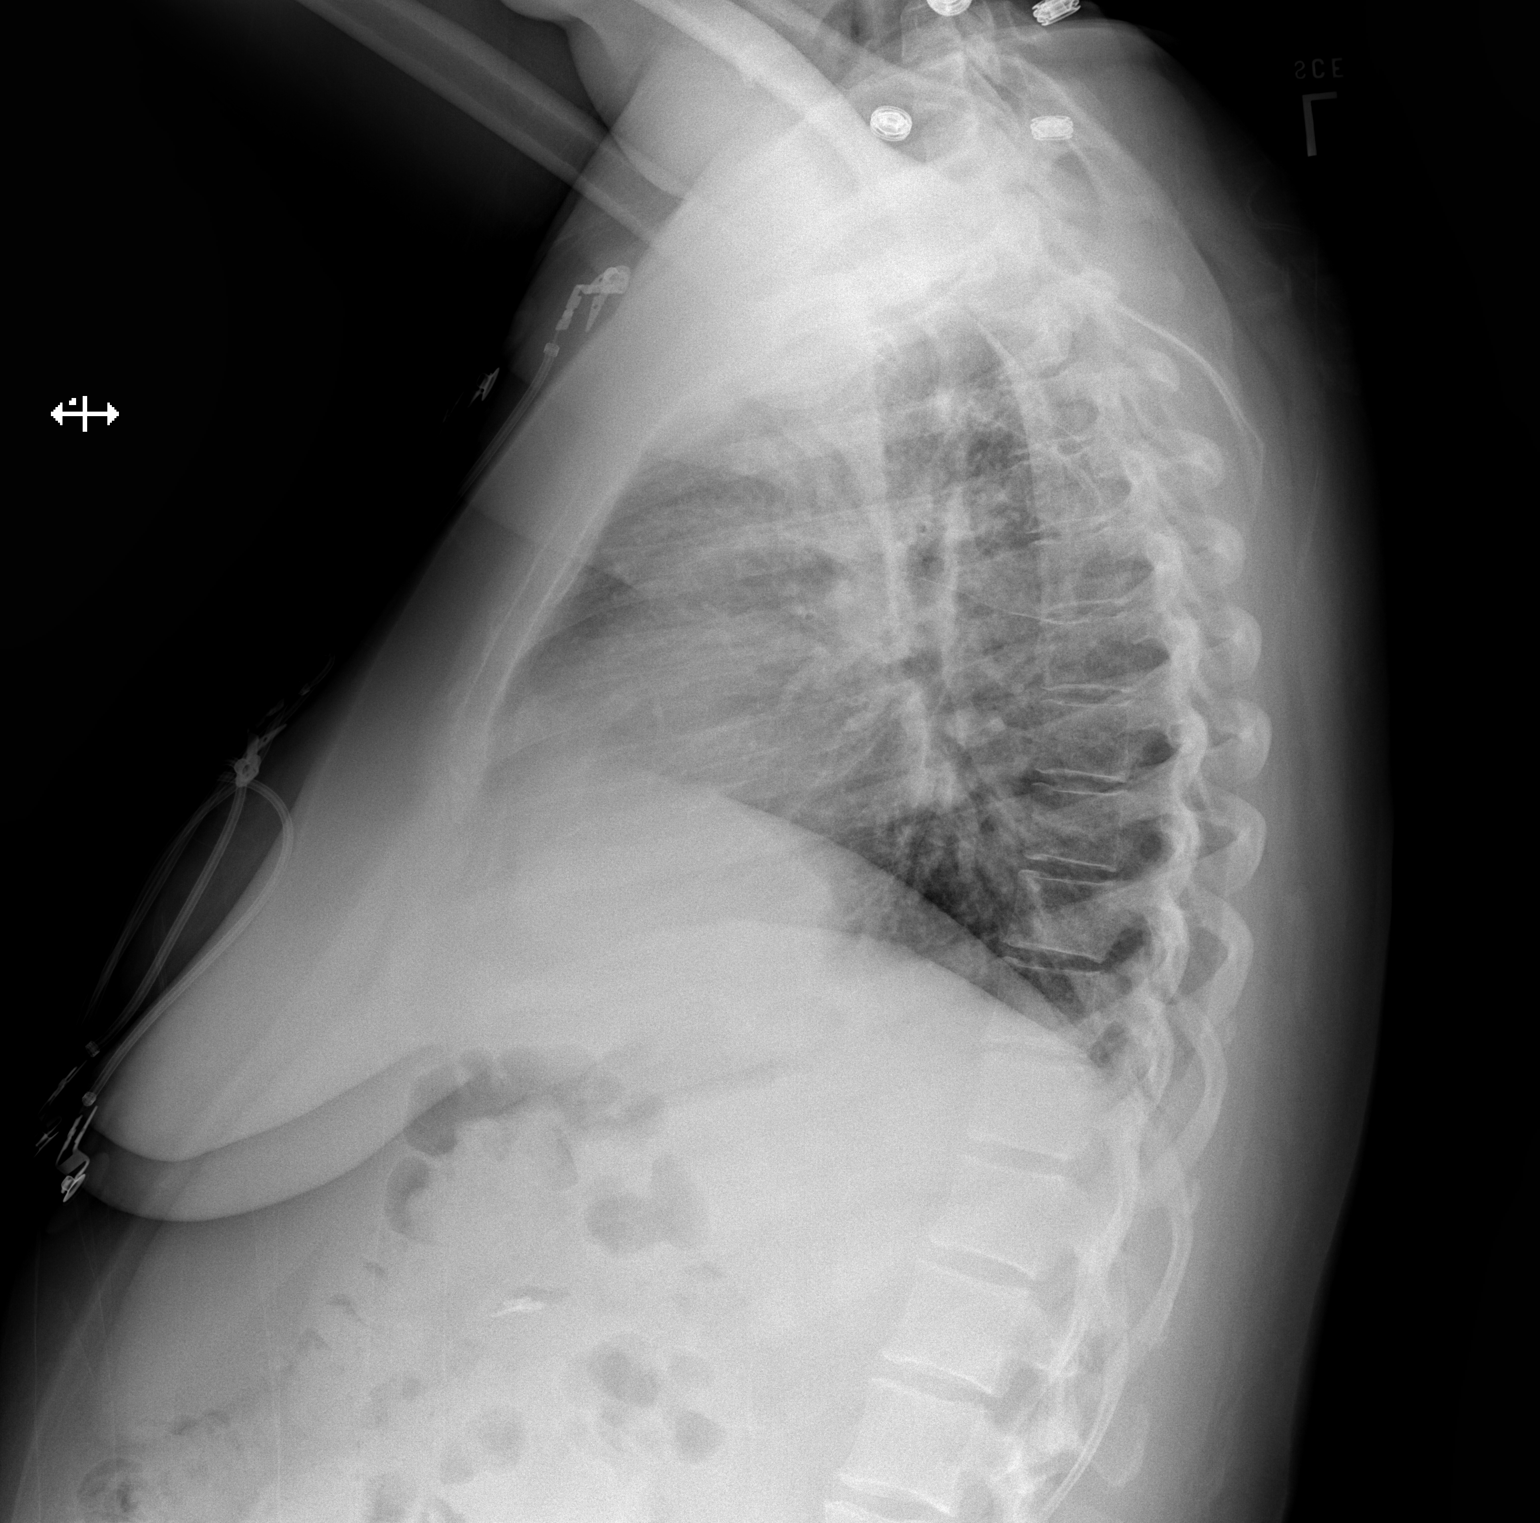

[2 of 2 positions shown; findings below may reference images not displayed]

FINDINGS: Single portable AP chest radiograph is provided. There is right upper lobe
consolidation with improved interval aeration. There is no pleural effusion
or pneumothorax. Normal cardiomediastinal silhouette. The osseous structures
are unremarkable.
IMPRESSION: Right upper lobe pneumonia with improved interval aeration. Recommend
followup radiography in 4-6 weeks to document complete resolution following
adequate medical therapy. If there is not complete resolution, then
recommend further evaluation with CT of the chest to exclude underlying
pathology.

## 2013-07-23 ENCOUNTER — Emergency Department: Payer: Self-pay | Admitting: Internal Medicine

## 2013-07-23 LAB — COMPREHENSIVE METABOLIC PANEL
Albumin: 3.2 g/dL — ABNORMAL LOW (ref 3.4–5.0)
Alkaline Phosphatase: 56 U/L (ref 50–136)
Bilirubin,Total: 0.3 mg/dL (ref 0.2–1.0)
Chloride: 100 mmol/L (ref 98–107)
EGFR (Non-African Amer.): 54 — ABNORMAL LOW
Osmolality: 279 (ref 275–301)
Potassium: 4.7 mmol/L (ref 3.5–5.1)
Total Protein: 6.1 g/dL — ABNORMAL LOW (ref 6.4–8.2)

## 2013-07-23 LAB — CK TOTAL AND CKMB (NOT AT ARMC)
CK, Total: 86 U/L (ref 21–215)
CK-MB: 3.7 ng/mL — ABNORMAL HIGH (ref 0.5–3.6)

## 2013-07-23 LAB — CBC
HCT: 34.3 % — ABNORMAL LOW (ref 35.0–47.0)
HGB: 11.6 g/dL — ABNORMAL LOW (ref 12.0–16.0)
MCH: 30.6 pg (ref 26.0–34.0)
MCHC: 33.9 g/dL (ref 32.0–36.0)
Platelet: 235 10*3/uL (ref 150–440)
RDW: 13.9 % (ref 11.5–14.5)

## 2013-07-23 LAB — TROPONIN I: Troponin-I: <0.02 ng/mL

## 2013-08-16 ENCOUNTER — Emergency Department: Payer: Self-pay | Admitting: Emergency Medicine

## 2013-08-16 LAB — COMPREHENSIVE METABOLIC PANEL
Alkaline Phosphatase: 59 U/L
Anion Gap: 12 (ref 7–16)
Calcium, Total: 9.2 mg/dL (ref 8.5–10.1)
Chloride: 89 mmol/L — ABNORMAL LOW (ref 98–107)
Co2: 23 mmol/L (ref 21–32)
EGFR (African American): 48 — ABNORMAL LOW
EGFR (Non-African Amer.): 42 — ABNORMAL LOW
Glucose: 682 mg/dL (ref 65–99)
Potassium: 4.5 mmol/L (ref 3.5–5.1)
SGOT(AST): 30 U/L (ref 15–37)
SGPT (ALT): 29 U/L (ref 12–78)
Sodium: 124 mmol/L — ABNORMAL LOW (ref 136–145)

## 2013-08-16 LAB — CBC
HCT: 39.4 % (ref 35.0–47.0)
HGB: 12.8 g/dL (ref 12.0–16.0)
MCH: 30.2 pg (ref 26.0–34.0)
MCHC: 32.3 g/dL (ref 32.0–36.0)
Platelet: 211 10*3/uL (ref 150–440)
RDW: 14.1 % (ref 11.5–14.5)
WBC: 8.2 10*3/uL (ref 3.6–11.0)

## 2013-08-16 LAB — URINALYSIS, COMPLETE
Bacteria: NONE SEEN
Blood: NEGATIVE
Leukocyte Esterase: NEGATIVE
Nitrite: NEGATIVE
Protein: NEGATIVE
Specific Gravity: 1.026 (ref 1.003–1.030)

## 2013-09-04 ENCOUNTER — Emergency Department: Payer: Self-pay | Admitting: Emergency Medicine

## 2013-09-04 HISTORY — PX: LUNG BIOPSY: SHX232

## 2013-09-04 LAB — COMPREHENSIVE METABOLIC PANEL
ANION GAP: 6 — AB (ref 7–16)
AST: 44 U/L — AB (ref 15–37)
Albumin: 3.1 g/dL — ABNORMAL LOW (ref 3.4–5.0)
Alkaline Phosphatase: 48 U/L
BILIRUBIN TOTAL: 0.3 mg/dL (ref 0.2–1.0)
BUN: 7 mg/dL (ref 7–18)
CO2: 27 mmol/L (ref 21–32)
Calcium, Total: 9 mg/dL (ref 8.5–10.1)
Chloride: 101 mmol/L (ref 98–107)
Creatinine: 1.1 mg/dL (ref 0.60–1.30)
GFR CALC NON AF AMER: 59 — AB
Glucose: 145 mg/dL — ABNORMAL HIGH (ref 65–99)
OSMOLALITY: 269 (ref 275–301)
POTASSIUM: 4.3 mmol/L (ref 3.5–5.1)
SGPT (ALT): 46 U/L (ref 12–78)
SODIUM: 134 mmol/L — AB (ref 136–145)
Total Protein: 6.2 g/dL — ABNORMAL LOW (ref 6.4–8.2)

## 2013-09-04 LAB — CBC
HCT: 37.6 % (ref 35.0–47.0)
HGB: 12.7 g/dL (ref 12.0–16.0)
MCH: 30.9 pg (ref 26.0–34.0)
MCHC: 33.8 g/dL (ref 32.0–36.0)
MCV: 92 fL (ref 80–100)
Platelet: 148 10*3/uL — ABNORMAL LOW (ref 150–440)
RBC: 4.11 10*6/uL (ref 3.80–5.20)
RDW: 14.1 % (ref 11.5–14.5)
WBC: 6.4 10*3/uL (ref 3.6–11.0)

## 2013-09-04 LAB — TROPONIN I: Troponin-I: <0.02 ng/mL

## 2013-09-04 LAB — RAPID INFLUENZA A&B ANTIGENS

## 2013-09-21 ENCOUNTER — Inpatient Hospital Stay: Payer: Self-pay | Admitting: Internal Medicine

## 2013-09-21 LAB — URINALYSIS, COMPLETE
Bacteria: NONE SEEN
Bilirubin,UR: NEGATIVE
Blood: NEGATIVE
Glucose,UR: >=500 mg/dL (ref 0–75)
Nitrite: NEGATIVE
Ph: 5 (ref 4.5–8.0)
Protein: NEGATIVE
RBC,UR: 1 /HPF (ref 0–5)
SPECIFIC GRAVITY: 1.022 (ref 1.003–1.030)
Squamous Epithelial: 1
WBC UR: 3 /HPF (ref 0–5)

## 2013-09-21 LAB — CBC WITH DIFFERENTIAL/PLATELET
BASOS ABS: 0.1 10*3/uL (ref 0.0–0.1)
BASOS PCT: 0.8 %
EOS ABS: 0.1 10*3/uL (ref 0.0–0.7)
Eosinophil %: 2 %
HCT: 32.8 % — ABNORMAL LOW (ref 35.0–47.0)
HGB: 10.8 g/dL — AB (ref 12.0–16.0)
LYMPHS PCT: 37.7 %
Lymphocyte #: 2.5 10*3/uL (ref 1.0–3.6)
MCH: 30.4 pg (ref 26.0–34.0)
MCHC: 32.9 g/dL (ref 32.0–36.0)
MCV: 92 fL (ref 80–100)
MONOS PCT: 12.7 %
Monocyte #: 0.8 x10 3/mm (ref 0.2–0.9)
Neutrophil #: 3.1 10*3/uL (ref 1.4–6.5)
Neutrophil %: 46.8 %
Platelet: 252 10*3/uL (ref 150–440)
RBC: 3.55 10*6/uL — ABNORMAL LOW (ref 3.80–5.20)
RDW: 14 % (ref 11.5–14.5)
WBC: 6.7 10*3/uL (ref 3.6–11.0)

## 2013-09-21 LAB — COMPREHENSIVE METABOLIC PANEL
ALBUMIN: 3 g/dL — AB (ref 3.4–5.0)
ALK PHOS: 46 U/L
Anion Gap: 9 (ref 7–16)
BUN: 25 mg/dL — AB (ref 7–18)
Bilirubin,Total: 0.3 mg/dL (ref 0.2–1.0)
CREATININE: 1.17 mg/dL (ref 0.60–1.30)
Calcium, Total: 9.6 mg/dL (ref 8.5–10.1)
Chloride: 97 mmol/L — ABNORMAL LOW (ref 98–107)
Co2: 27 mmol/L (ref 21–32)
EGFR (African American): >60
EGFR (Non-African Amer.): 55 — ABNORMAL LOW
GLUCOSE: 413 mg/dL — AB (ref 65–99)
Osmolality: 288 (ref 275–301)
POTASSIUM: 5 mmol/L (ref 3.5–5.1)
SGOT(AST): 24 U/L (ref 15–37)
SGPT (ALT): 39 U/L (ref 12–78)
SODIUM: 133 mmol/L — AB (ref 136–145)
TOTAL PROTEIN: 5.9 g/dL — AB (ref 6.4–8.2)

## 2013-09-21 LAB — TSH: Thyroid Stimulating Horm: 1.35 u[IU]/mL

## 2013-09-21 LAB — PRO B NATRIURETIC PEPTIDE: B-TYPE NATIURETIC PEPTID: 40 pg/mL (ref 0–125)

## 2013-09-21 LAB — TROPONIN I: Troponin-I: <0.02 ng/mL

## 2013-09-22 LAB — BASIC METABOLIC PANEL
ANION GAP: 6 — AB (ref 7–16)
BUN: 16 mg/dL (ref 7–18)
CHLORIDE: 98 mmol/L (ref 98–107)
CREATININE: 0.92 mg/dL (ref 0.60–1.30)
Calcium, Total: 9.2 mg/dL (ref 8.5–10.1)
Co2: 28 mmol/L (ref 21–32)
EGFR (African American): >60
EGFR (Non-African Amer.): >60
Glucose: 295 mg/dL — ABNORMAL HIGH (ref 65–99)
Osmolality: 277 (ref 275–301)
Potassium: 4.8 mmol/L (ref 3.5–5.1)
SODIUM: 132 mmol/L — AB (ref 136–145)

## 2013-09-22 LAB — TSH: Thyroid Stimulating Horm: 0.25 u[IU]/mL — ABNORMAL LOW

## 2013-09-22 LAB — LIPID PANEL
Cholesterol: 179 mg/dL (ref 0–200)
HDL Cholesterol: 9 mg/dL — ABNORMAL LOW (ref 40–60)
Triglycerides: 829 mg/dL — ABNORMAL HIGH (ref 0–200)

## 2013-09-22 LAB — URINE CULTURE

## 2013-09-22 LAB — MAGNESIUM: Magnesium: 1.7 mg/dL — ABNORMAL LOW

## 2013-09-22 LAB — HEMOGLOBIN A1C: Hemoglobin A1C: 10.5 % — ABNORMAL HIGH (ref 4.2–6.3)

## 2013-09-23 LAB — MAGNESIUM: Magnesium: 2.3 mg/dL

## 2013-09-25 ENCOUNTER — Ambulatory Visit: Payer: Medicare Other | Admitting: Internal Medicine

## 2013-10-07 ENCOUNTER — Ambulatory Visit: Payer: Medicare Other | Admitting: Internal Medicine

## 2013-10-14 ENCOUNTER — Ambulatory Visit: Payer: Medicare Other | Admitting: Internal Medicine

## 2013-10-14 ENCOUNTER — Emergency Department: Payer: Self-pay | Admitting: Emergency Medicine

## 2013-10-14 LAB — CBC
HCT: 36.1 % (ref 35.0–47.0)
HGB: 12 g/dL (ref 12.0–16.0)
MCH: 31 pg (ref 26.0–34.0)
MCHC: 33.2 g/dL (ref 32.0–36.0)
MCV: 93 fL (ref 80–100)
Platelet: 183 10*3/uL (ref 150–440)
RBC: 3.87 10*6/uL (ref 3.80–5.20)
RDW: 14.6 % — AB (ref 11.5–14.5)
WBC: 6 10*3/uL (ref 3.6–11.0)

## 2013-10-14 LAB — TROPONIN I: Troponin-I: <0.02 ng/mL

## 2013-10-14 LAB — BASIC METABOLIC PANEL
Anion Gap: 5 — ABNORMAL LOW (ref 7–16)
BUN: 14 mg/dL (ref 7–18)
CHLORIDE: 101 mmol/L (ref 98–107)
Calcium, Total: 9.2 mg/dL (ref 8.5–10.1)
Co2: 30 mmol/L (ref 21–32)
Creatinine: 0.99 mg/dL (ref 0.60–1.30)
EGFR (African American): >60
EGFR (Non-African Amer.): >60
Glucose: 236 mg/dL — ABNORMAL HIGH (ref 65–99)
OSMOLALITY: 280 (ref 275–301)
Potassium: 4.9 mmol/L (ref 3.5–5.1)
SODIUM: 136 mmol/L (ref 136–145)

## 2013-10-20 ENCOUNTER — Encounter: Payer: Self-pay | Admitting: Pulmonary Disease

## 2013-10-20 ENCOUNTER — Ambulatory Visit (INDEPENDENT_AMBULATORY_CARE_PROVIDER_SITE_OTHER): Payer: Medicare Other | Admitting: Pulmonary Disease

## 2013-10-20 VITALS — BP 124/66 | HR 94 | Ht 59.0 in | Wt 197.0 lb

## 2013-10-20 DIAGNOSIS — R0602 Shortness of breath: Secondary | ICD-10-CM

## 2013-10-20 DIAGNOSIS — G4733 Obstructive sleep apnea (adult) (pediatric): Secondary | ICD-10-CM

## 2013-10-20 DIAGNOSIS — R0609 Other forms of dyspnea: Secondary | ICD-10-CM

## 2013-10-20 DIAGNOSIS — R0600 Dyspnea, unspecified: Secondary | ICD-10-CM

## 2013-10-20 DIAGNOSIS — R0989 Other specified symptoms and signs involving the circulatory and respiratory systems: Secondary | ICD-10-CM

## 2013-10-20 MED ORDER — AEROCHAMBER MV MISC: Status: DC

## 2013-10-20 NOTE — Progress Notes (Signed)
Subjective:    Patient ID: [name], female    DOB: 1966/01/06, 48 y.o.   MRN: [id number]  HPI  [name] has been told that she has COPD and is here to se me for the same.  She states that about every two months she gets wo where she "can't breath" and has to be seen for it.  She will fe dyspnea and chest pressure that extends to her back.  This has been going on for two years.  She will get short of breath with minimal walking (just walking 20-30 feet level ground).  She gets chest pressure (substernal).  She has never smoked.  No brothers sister or parents with lung problems.  She has been around second hand smoke from her husband and father.  She previously worked with uniform cleaning.   She had asthma as a child. She was never hospitalized for this.    She has been hospitalized twice in the in last year for pneumonia.  At the time of discharge she was told that he has COPD.  She always required a lot of albuterol both times and was discharged on albuterol.    She was hospitalized two weeks ago at [facility] for a COPD exacerbation.  She has been using albuterol 1-2 times a day since then.  She was discharged on prednisone.  Sheh as improved since being in the hospital, but she feels like she can't breath.  She also takes sybmciort once a day as needed for shortness of breath (used twice in the last week).  She uses oxygen at night. She has done this for 6 months now.  It makes her breath better at night.    She had an echocardiogram three or four years ago after she was told that she had two mild heart attacks.  Dr. Humphrey Rolls follows her at [facility] in [place].    She has been told that she has scarring on her left lung.    She has sleep apnea which has been unable to be treated with CPAP because she hasn't been able to find a good mask for her despite a lot of trying.  Past Medical History  Diagnosis Date  . High blood pressure   . Heart attack   . Irregular heart rhythm   .  Asthma   . Seasonal allergies   . Chronic headaches   . Sleep apnea      Family History  Problem Relation Age of Onset  . Asthma Daughter   . Heart disease Mother   . Heart disease Maternal Grandfather   . Rheum arthritis Maternal Grandfather   . Cancer Maternal Grandfather     liver  . Cancer Paternal Grandmother     lung     History   Social History  . Marital Status: Married    Spouse Name: N/A    Number of Children: N/A  . Years of Education: N/A   Occupational History  . Not on file.   Social History Main Topics  . Smoking status: Never Smoker   . Smokeless tobacco: Never Used  . Alcohol Use: No  . Drug Use: No  . Sexual Activity: Not on file   Other Topics Concern  . Not on file   Social History Narrative  . No narrative on file     Allergies  Allergen Reactions  . Morphine And Related     anaphylaxis  . Codeine     itching  . Vicodin [  Hydrocodone-Acetaminophen]     itching     No outpatient prescriptions prior to visit.   No facility-administered medications prior to visit.      Review of Systems  Constitutional: Positive for unexpected weight change. Negative for fever.  HENT: Positive for ear pain and sore throat. Negative for congestion, dental problem, nosebleeds, postnasal drip, rhinorrhea, sinus pressure, sneezing and trouble swallowing.   Eyes: Negative for redness and itching.  Respiratory: Positive for cough, chest tightness and shortness of breath. Negative for wheezing.   Cardiovascular: Negative for palpitations and leg swelling.  Gastrointestinal: Negative for nausea and vomiting.  Genitourinary: Negative for dysuria.  Musculoskeletal: Positive for joint swelling.  Skin: Negative for rash.  Neurological: Positive for headaches.  Hematological: Does not bruise/bleed easily.  Psychiatric/Behavioral: Positive for dysphoric mood. The patient is nervous/anxious.        Objective:   Physical Exam Filed Vitals:   10/20/13  1058  BP: 124/66  Pulse: 94  Height: 4' 11"  (1.499 m)  Weight: 197 lb (89.359 kg)  SpO2: 94%  RA  Ambulated about 200 feet in the office and O2 saturation went no lower than 93%  Gen: obese, chronically ill appearing, no acute distress HEENT: NCAT, PERRL, EOMi, OP clear, neck supple without masses PULM: CTA B CV: RRR, no mgr, no JVD AB: BS+, soft, nontender, no hsm Ext: warm, no edema, no clubbing, no cyanosis Derm: no rash or skin breakdown Neuro: A&Ox4, CN II-XII intact, strength 5/5 in all 4 extremities         Assessment & Plan:   Dyspnea I explained to [name] and her husband that the ddx of dyspnea is broad and can include cardiac and pulmonary causes. She is not wheezing today and her oxygenation was normal on ambulation.  She has never smoked, so I doubt COPD, but asthma is possible.   Given her known history of coronary artery disease I explained to her that I am worried about asthma, cardiac disease, and possibly pulmonary hypertension given her obesity and untreated OSA.  Plan: -treat asthma for now with symbicort (she was not taking it appropriately so I asked her to take symbicort twice a day no matter how she feels with a spacer) -full PFT with bronchodilator -follow up with her cardiologist for an echocardiogram and left heart cath (she has this scheduled)  Obstructive sleep apnea Apparently she has tried at length to find a mask that fits but nothing has ever worked.  She does not want surgery for this and Dr. Richardson Landry with [facility] says that it would be a very complicated procedure.  Plan: -continue O2 at night -echo to look for pulmonary hypertension   Updated Medication List Outpatient Encounter Prescriptions as of 10/20/2013  Medication Sig  . ALPRAZolam (XANAX) 0.25 MG tablet Take 0.25 mg by mouth 2 (two) times daily.  . Choline Fenofibrate (FENOFIBRIC ACID) 135 MG CPDR Take 1 capsule by mouth daily.  . divalproex (DEPAKOTE) 500 MG DR tablet Take  1,000 mg by mouth 2 (two) times daily.  . DULoxetine (CYMBALTA) 60 MG capsule Take 120 mg by mouth daily.  Marland Kitchen gabapentin (NEURONTIN) 300 MG capsule Take 300 mg by mouth 3 (three) times daily.  . insulin glargine (LANTUS) 100 UNIT/ML injection Inject 75 Units into the skin 2 (two) times daily.  . insulin lispro (HUMALOG) 100 UNIT/ML injection Inject into the skin 3 (three) times daily as needed for high blood sugar.  . lamoTRIgine (LAMICTAL) 100 MG tablet Take 100 mg  by mouth at bedtime.  . lamoTRIgine (LAMICTAL) 25 MG tablet Take 25 mg by mouth at bedtime.  Marland Kitchen levothyroxine (SYNTHROID, LEVOTHROID) 75 MCG tablet Take 75 mcg by mouth daily before breakfast.  . Liraglutide (VICTOZA) 18 MG/3ML SOPN Inject 1.8 Units into the skin daily.  . magnesium oxide (MAG-OX) 400 MG tablet Take 400 mg by mouth 2 (two) times daily.  . metFORMIN (GLUCOPHAGE) 1000 MG tablet Take 1,000 mg by mouth 2 (two) times daily with a meal.  . QUEtiapine (SEROQUEL) 300 MG tablet Take 900 mg by mouth at bedtime.  . quinapril (ACCUPRIL) 5 MG tablet Take 5 mg by mouth daily.  . rosuvastatin (CRESTOR) 10 MG tablet Take 10 mg by mouth at bedtime.  Marland Kitchen Spacer/Aero-Holding Chambers (AEROCHAMBER MV) inhaler Use as instructed

## 2013-10-20 NOTE — Assessment & Plan Note (Signed)
I explained to [name] and her husband that the ddx of dyspnea is broad and can include cardiac and pulmonary causes. She is not wheezing today and her oxygenation was normal on ambulation.  She has never smoked, so I doubt COPD, but asthma is possible.   Given her known history of coronary artery disease I explained to her that I am worried about asthma, cardiac disease, and possibly pulmonary hypertension given her obesity and untreated OSA.  Plan: -treat asthma for now with symbicort (she was not taking it appropriately so I asked her to take symbicort twice a day no matter how she feels with a spacer) -full PFT with bronchodilator -follow up with her cardiologist for an echocardiogram and left heart cath (she has this scheduled)

## 2013-10-20 NOTE — Assessment & Plan Note (Signed)
Apparently she has tried at length to find a mask that fits but nothing has ever worked.  She does not want surgery for this and Dr. Richardson Landry with [facility] says that it would be a very complicated procedure.  Plan: -continue O2 at night -echo to look for pulmonary hypertension

## 2013-10-20 NOTE — Patient Instructions (Signed)
Use the symbicort 2 puffs twice a day no matter how you feel; use it with a spacer We will set up a lung function test at [facility] called Full PFT, pre-post bronchodilator Use your oxygen at night Schedule an appointment with your cardiologist.  They should consider ordering an echocardiogram We will schedule a follow up visit with [place] in [facility] for 4-6 weeks from now

## 2013-10-24 ENCOUNTER — Observation Stay: Payer: Self-pay | Admitting: Internal Medicine

## 2013-10-24 LAB — CBC WITH DIFFERENTIAL/PLATELET
Basophil #: 0.1 10*3/uL (ref 0.0–0.1)
Basophil %: 1.1 %
Eosinophil #: 0.1 10*3/uL (ref 0.0–0.7)
Eosinophil %: 2.6 %
HCT: 36.7 % (ref 35.0–47.0)
HGB: 11.8 g/dL — ABNORMAL LOW (ref 12.0–16.0)
LYMPHS ABS: 2.1 10*3/uL (ref 1.0–3.6)
LYMPHS PCT: 39.6 %
MCH: 30 pg (ref 26.0–34.0)
MCHC: 32.2 g/dL (ref 32.0–36.0)
MCV: 93 fL (ref 80–100)
MONO ABS: 0.7 x10 3/mm (ref 0.2–0.9)
Monocyte %: 13.6 %
NEUTROS PCT: 43.1 %
Neutrophil #: 2.3 10*3/uL (ref 1.4–6.5)
Platelet: 233 10*3/uL (ref 150–440)
RBC: 3.94 10*6/uL (ref 3.80–5.20)
RDW: 14 % (ref 11.5–14.5)
WBC: 5.4 10*3/uL (ref 3.6–11.0)

## 2013-10-24 LAB — COMPREHENSIVE METABOLIC PANEL
Albumin: 2.9 g/dL — ABNORMAL LOW (ref 3.4–5.0)
Alkaline Phosphatase: 42 U/L — ABNORMAL LOW
Anion Gap: 6 — ABNORMAL LOW (ref 7–16)
BUN: 12 mg/dL (ref 7–18)
Bilirubin,Total: 0.3 mg/dL (ref 0.2–1.0)
CO2: 27 mmol/L (ref 21–32)
CREATININE: 0.9 mg/dL (ref 0.60–1.30)
Calcium, Total: 8.4 mg/dL — ABNORMAL LOW (ref 8.5–10.1)
Chloride: 100 mmol/L (ref 98–107)
EGFR (African American): >60
GLUCOSE: 259 mg/dL — AB (ref 65–99)
Osmolality: 275 (ref 275–301)
Potassium: 5.6 mmol/L — ABNORMAL HIGH (ref 3.5–5.1)
SGOT(AST): 62 U/L — ABNORMAL HIGH (ref 15–37)
SGPT (ALT): 34 U/L (ref 12–78)
Sodium: 133 mmol/L — ABNORMAL LOW (ref 136–145)
Total Protein: 6 g/dL — ABNORMAL LOW (ref 6.4–8.2)

## 2013-10-24 LAB — CK-MB: CK-MB: 1.1 ng/mL (ref 0.5–3.6)

## 2013-10-24 LAB — TROPONIN I
Troponin-I: <0.02 ng/mL
Troponin-I: <0.02 ng/mL

## 2013-10-25 LAB — LIPID PANEL
Cholesterol: 141 mg/dL (ref 0–200)
HDL Cholesterol: 9 mg/dL — ABNORMAL LOW (ref 40–60)
TRIGLYCERIDES: 830 mg/dL — AB (ref 0–200)

## 2013-10-25 LAB — CBC WITH DIFFERENTIAL/PLATELET
Bands: 1 %
Comment - H1-Com3: NORMAL
Eosinophil: 2 %
HCT: 36.2 % (ref 35.0–47.0)
HGB: 12 g/dL (ref 12.0–16.0)
Lymphocytes: 67 %
MCH: 31.2 pg (ref 26.0–34.0)
MCHC: 33.2 g/dL (ref 32.0–36.0)
MCV: 94 fL (ref 80–100)
Metamyelocyte: 1 %
Monocytes: 7 %
Myelocyte: 1 %
Platelet: 228 10*3/uL (ref 150–440)
RBC: 3.85 10*6/uL (ref 3.80–5.20)
RDW: 14.1 % (ref 11.5–14.5)
SEGMENTED NEUTROPHILS: 17 %
Variant Lymphocyte - H1-Rlymph: 4 %
WBC: 5.9 10*3/uL (ref 3.6–11.0)

## 2013-10-25 LAB — TROPONIN I: Troponin-I: <0.02 ng/mL

## 2013-10-25 LAB — BASIC METABOLIC PANEL
ANION GAP: 8 (ref 7–16)
BUN: 13 mg/dL (ref 7–18)
CALCIUM: 9 mg/dL (ref 8.5–10.1)
Chloride: 102 mmol/L (ref 98–107)
Co2: 27 mmol/L (ref 21–32)
Creatinine: 0.97 mg/dL (ref 0.60–1.30)
EGFR (African American): >60
EGFR (Non-African Amer.): >60
Glucose: 220 mg/dL — ABNORMAL HIGH (ref 65–99)
Osmolality: 281 (ref 275–301)
Potassium: 4.5 mmol/L (ref 3.5–5.1)
Sodium: 137 mmol/L (ref 136–145)

## 2013-10-25 LAB — CK-MB
CK-MB: 1.1 ng/mL (ref 0.5–3.6)
CK-MB: 1 ng/mL (ref 0.5–3.6)

## 2013-10-28 ENCOUNTER — Ambulatory Visit: Payer: Self-pay | Admitting: Pulmonary Disease

## 2013-10-28 LAB — PULMONARY FUNCTION TEST

## 2013-11-04 ENCOUNTER — Ambulatory Visit: Payer: Medicare Other | Admitting: Internal Medicine

## 2013-11-04 ENCOUNTER — Encounter: Payer: Self-pay | Admitting: Pulmonary Disease

## 2013-11-04 ENCOUNTER — Telehealth: Payer: Self-pay

## 2013-11-04 NOTE — Telephone Encounter (Signed)
Pt aware of results and recs.  Nothing further needed. 

## 2013-11-04 NOTE — Telephone Encounter (Signed)
Message copied by Len Blalock on Tue Nov 04, 2013  3:34 PM ------      Message from: Juanito Doom      Created: Tue Nov 04, 2013  8:30 AM       Caryl Pina,            Please let her noted her pulmonary function testing did not show asthma or COPD. It did show mild restriction (she can't take as deep a breath of other women of her age and height) due to her weight.            Thanks,      Ruby Cola ------

## 2013-11-04 NOTE — Telephone Encounter (Signed)
lmtcb X1 

## 2013-11-14 ENCOUNTER — Ambulatory Visit: Payer: Medicare Other | Admitting: Internal Medicine

## 2013-11-19 ENCOUNTER — Ambulatory Visit: Payer: Medicare Other | Admitting: Pulmonary Disease

## 2013-12-08 ENCOUNTER — Ambulatory Visit: Payer: Medicare Other | Admitting: Internal Medicine

## 2014-01-15 ENCOUNTER — Emergency Department: Payer: Self-pay | Admitting: Emergency Medicine

## 2014-02-09 ENCOUNTER — Ambulatory Visit: Payer: Self-pay | Admitting: Specialist

## 2014-02-26 ENCOUNTER — Emergency Department: Payer: Self-pay | Admitting: Emergency Medicine

## 2014-02-26 LAB — CBC
HCT: 37.9 % (ref 35.0–47.0)
HGB: 12.1 g/dL (ref 12.0–16.0)
MCH: 28.7 pg (ref 26.0–34.0)
MCHC: 32 g/dL (ref 32.0–36.0)
MCV: 90 fL (ref 80–100)
Platelet: 297 10*3/uL (ref 150–440)
RBC: 4.23 10*6/uL (ref 3.80–5.20)
RDW: 14.9 % — ABNORMAL HIGH (ref 11.5–14.5)
WBC: 6.3 10*3/uL (ref 3.6–11.0)

## 2014-02-26 LAB — COMPREHENSIVE METABOLIC PANEL
ALK PHOS: 50 U/L
ANION GAP: 6 — AB (ref 7–16)
AST: 22 U/L (ref 15–37)
Albumin: 3.2 g/dL — ABNORMAL LOW (ref 3.4–5.0)
BUN: 27 mg/dL — ABNORMAL HIGH (ref 7–18)
Bilirubin,Total: 0.2 mg/dL (ref 0.2–1.0)
Calcium, Total: 10 mg/dL (ref 8.5–10.1)
Chloride: 99 mmol/L (ref 98–107)
Co2: 30 mmol/L (ref 21–32)
Creatinine: 0.94 mg/dL (ref 0.60–1.30)
EGFR (African American): >60
EGFR (Non-African Amer.): >60
GLUCOSE: 164 mg/dL — AB (ref 65–99)
Osmolality: 279 (ref 275–301)
POTASSIUM: 4.8 mmol/L (ref 3.5–5.1)
SGPT (ALT): 34 U/L (ref 12–78)
Sodium: 135 mmol/L — ABNORMAL LOW (ref 136–145)
Total Protein: 6.3 g/dL — ABNORMAL LOW (ref 6.4–8.2)

## 2014-02-26 LAB — URINALYSIS, COMPLETE
Bilirubin,UR: NEGATIVE
Blood: NEGATIVE
GLUCOSE, UR: NEGATIVE mg/dL (ref 0–75)
Leukocyte Esterase: NEGATIVE
Nitrite: NEGATIVE
PH: 5 (ref 4.5–8.0)
Protein: NEGATIVE
RBC,UR: NONE SEEN /HPF (ref 0–5)
Specific Gravity: 1.018 (ref 1.003–1.030)
Squamous Epithelial: 7
WBC UR: 1 /HPF (ref 0–5)

## 2014-02-26 LAB — LIPASE, BLOOD: LIPASE: 250 U/L (ref 73–393)

## 2014-02-26 LAB — PREGNANCY, URINE: PREGNANCY TEST, URINE: NEGATIVE m[IU]/mL

## 2014-02-28 ENCOUNTER — Emergency Department: Payer: Self-pay | Admitting: Emergency Medicine

## 2014-02-28 LAB — COMPREHENSIVE METABOLIC PANEL
ALK PHOS: 49 U/L
ANION GAP: 8 (ref 7–16)
AST: 23 U/L (ref 15–37)
Albumin: 3.1 g/dL — ABNORMAL LOW (ref 3.4–5.0)
BUN: 12 mg/dL (ref 7–18)
Bilirubin,Total: 0.2 mg/dL (ref 0.2–1.0)
CALCIUM: 9 mg/dL (ref 8.5–10.1)
Chloride: 99 mmol/L (ref 98–107)
Co2: 27 mmol/L (ref 21–32)
Creatinine: 1.02 mg/dL (ref 0.60–1.30)
GLUCOSE: 379 mg/dL — AB (ref 65–99)
Osmolality: 284 (ref 275–301)
Potassium: 4.4 mmol/L (ref 3.5–5.1)
SGPT (ALT): 39 U/L (ref 12–78)
SODIUM: 134 mmol/L — AB (ref 136–145)
TOTAL PROTEIN: 5.9 g/dL — AB (ref 6.4–8.2)

## 2014-02-28 LAB — URINALYSIS, COMPLETE
Bacteria: NONE SEEN
Bilirubin,UR: NEGATIVE
Blood: NEGATIVE
Leukocyte Esterase: NEGATIVE
NITRITE: NEGATIVE
Ph: 5 (ref 4.5–8.0)
Protein: NEGATIVE
Specific Gravity: 1.025 (ref 1.003–1.030)

## 2014-02-28 LAB — CBC WITH DIFFERENTIAL/PLATELET
Bands: 1 %
EOS PCT: 2 %
HCT: 35.2 % (ref 35.0–47.0)
HGB: 11.4 g/dL — ABNORMAL LOW (ref 12.0–16.0)
Lymphocytes: 49 %
MCH: 29.1 pg (ref 26.0–34.0)
MCHC: 32.5 g/dL (ref 32.0–36.0)
MCV: 90 fL (ref 80–100)
MYELOCYTE: 2 %
Monocytes: 3 %
Platelet: 236 10*3/uL (ref 150–440)
RBC: 3.93 10*6/uL (ref 3.80–5.20)
RDW: 14.8 % — ABNORMAL HIGH (ref 11.5–14.5)
Segmented Neutrophils: 42 %
VARIANT LYMPHOCYTE - H1-RLYMPH: 1 %
WBC: 5.2 10*3/uL (ref 3.6–11.0)

## 2014-04-05 IMAGING — CR DG CHEST 2V
1 series · 2 of 2 positions shown · non-contrast
Comparison: none

REASON FOR EXAM: SOB
COMMENTS:

PROCEDURE:     DXR - DXR CHEST PA (OR AP) AND LATERAL  - September 01, 2012  [DATE]
RESULT:     Comparison: 01/02/2012

[Series 1: w chest pa · 0.14mm/px · 2 of 2 slices shown]
[im 1/2]
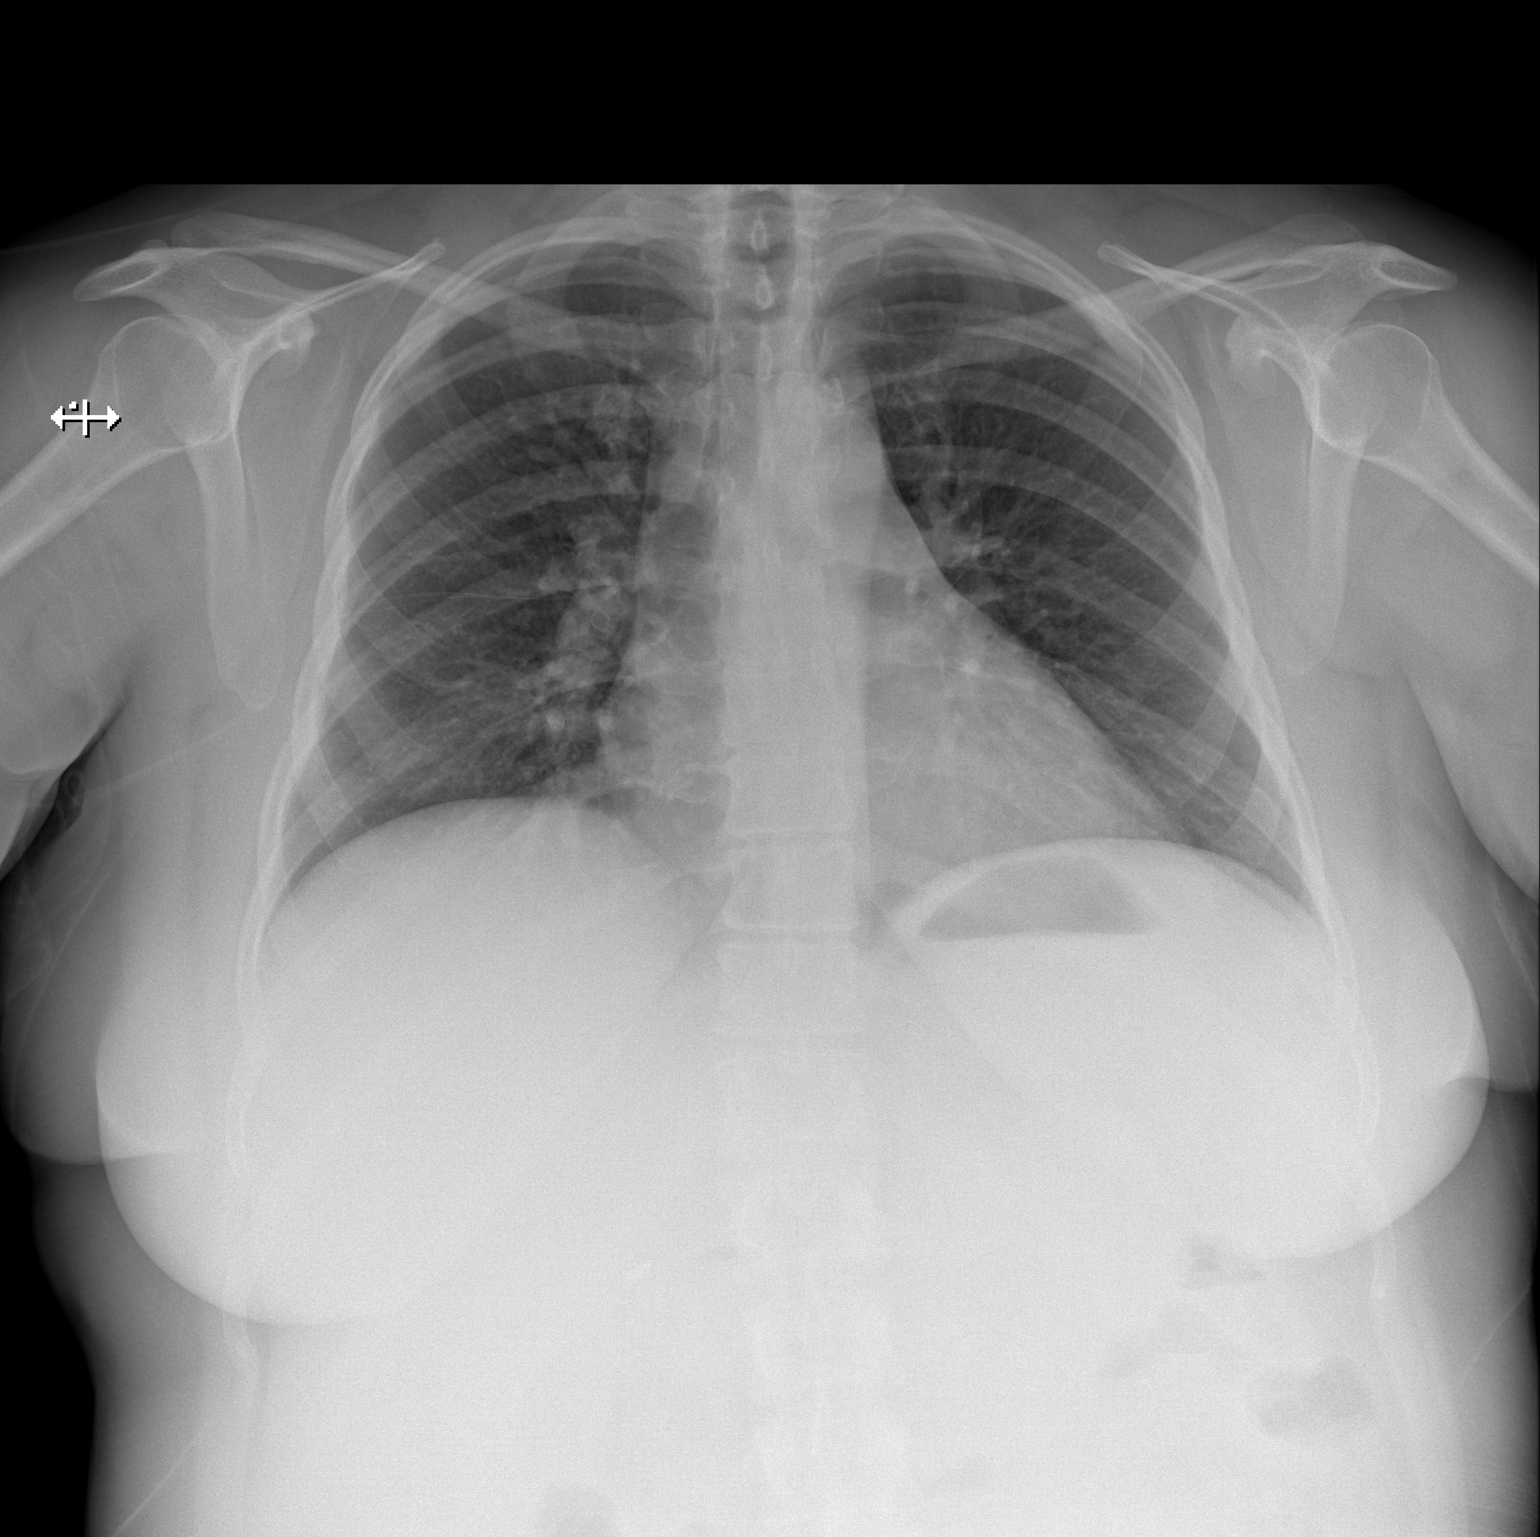
[im 2/2]
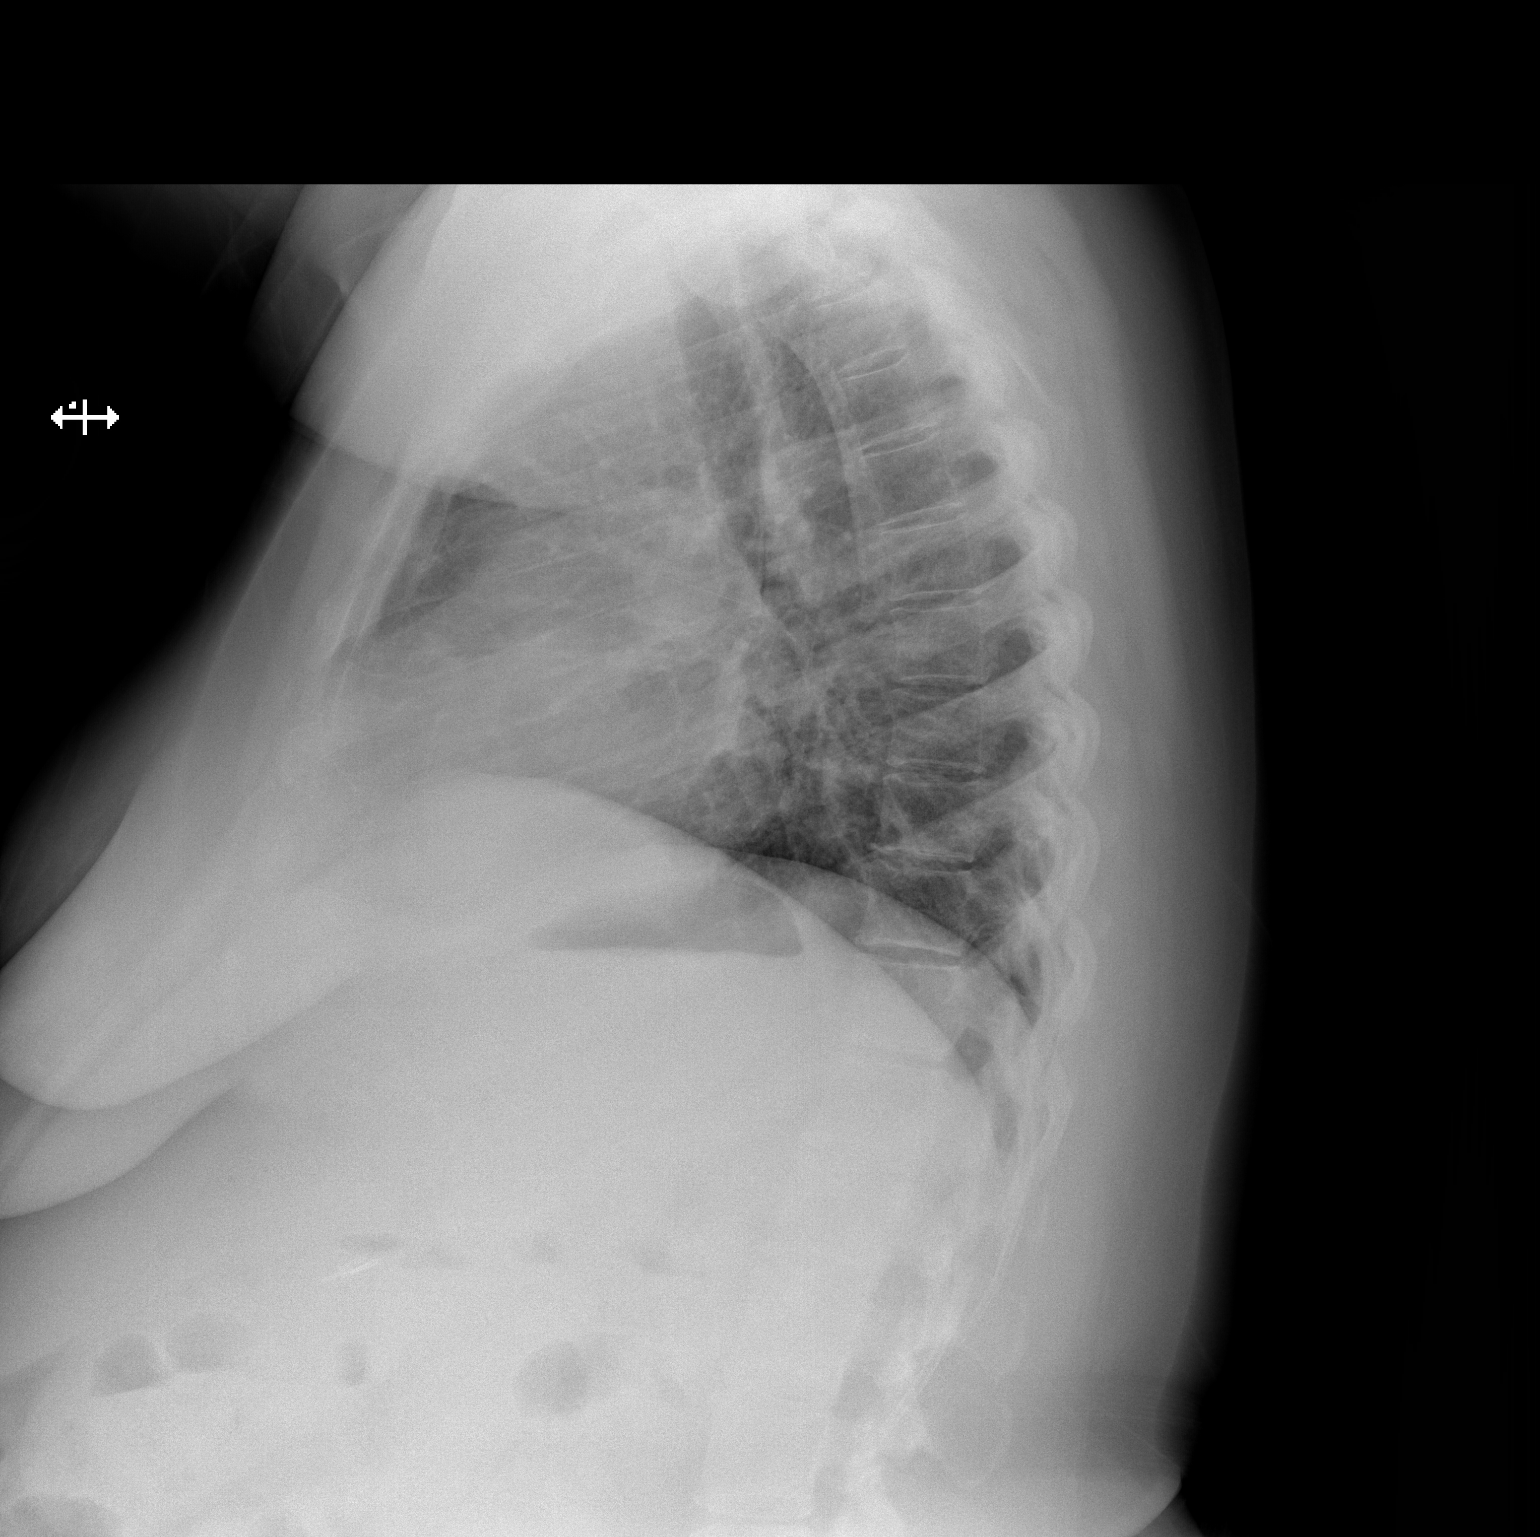

[2 of 2 positions shown; findings below may reference images not displayed]

FINDINGS: The heart and mediastinum are stable given the low lung volumes. Mild
basilar and perihilar opacities are likely secondary to atelectasis and
vascular crowding.
IMPRESSION: Mild basilar and perihilar opacities are likely secondary to atelectasis and
vascular crowding from the low lung volumes.

[REDACTED]

## 2014-04-06 ENCOUNTER — Ambulatory Visit: Payer: Self-pay | Admitting: Family Medicine

## 2014-04-20 ENCOUNTER — Emergency Department: Payer: Self-pay | Admitting: Emergency Medicine

## 2014-04-20 LAB — COMPREHENSIVE METABOLIC PANEL
ALBUMIN: 3.2 g/dL — AB (ref 3.4–5.0)
ALK PHOS: 55 U/L
ANION GAP: 7 (ref 7–16)
AST: 76 U/L — AB (ref 15–37)
BUN: 21 mg/dL — AB (ref 7–18)
Bilirubin,Total: 0.2 mg/dL (ref 0.2–1.0)
CALCIUM: 8.4 mg/dL — AB (ref 8.5–10.1)
CHLORIDE: 104 mmol/L (ref 98–107)
CO2: 29 mmol/L (ref 21–32)
CREATININE: 1.17 mg/dL (ref 0.60–1.30)
EGFR (African American): >60
GFR CALC NON AF AMER: 54 — AB
Glucose: 223 mg/dL — ABNORMAL HIGH (ref 65–99)
Osmolality: 289 (ref 275–301)
POTASSIUM: 4.4 mmol/L (ref 3.5–5.1)
SGPT (ALT): 103 U/L — ABNORMAL HIGH
SODIUM: 140 mmol/L (ref 136–145)
Total Protein: 6.3 g/dL — ABNORMAL LOW (ref 6.4–8.2)

## 2014-04-20 LAB — CBC
HCT: 34.6 % — ABNORMAL LOW (ref 35.0–47.0)
HGB: 11.4 g/dL — ABNORMAL LOW (ref 12.0–16.0)
MCH: 29.8 pg (ref 26.0–34.0)
MCHC: 32.9 g/dL (ref 32.0–36.0)
MCV: 90 fL (ref 80–100)
Platelet: 260 10*3/uL (ref 150–440)
RBC: 3.83 10*6/uL (ref 3.80–5.20)
RDW: 15.1 % — AB (ref 11.5–14.5)
WBC: 6.1 10*3/uL (ref 3.6–11.0)

## 2014-04-20 LAB — TROPONIN I

## 2014-04-20 LAB — MAGNESIUM: Magnesium: 1.5 mg/dL — ABNORMAL LOW

## 2014-04-20 LAB — LIPASE, BLOOD: LIPASE: 293 U/L (ref 73–393)

## 2014-04-20 LAB — PHOSPHORUS: Phosphorus: 2.2 mg/dL — ABNORMAL LOW (ref 2.5–4.9)

## 2014-05-12 ENCOUNTER — Other Ambulatory Visit: Payer: Self-pay | Admitting: Urgent Care

## 2014-05-12 LAB — IRON AND TIBC
Iron Bind.Cap.(Total): 544 ug/dL — ABNORMAL HIGH (ref 250–450)
Iron Saturation: 8 %
Iron: 46 ug/dL — ABNORMAL LOW (ref 50–170)
UNBOUND IRON-BIND. CAP.: 498 ug/dL

## 2014-05-12 LAB — CBC WITH DIFFERENTIAL/PLATELET
BASOS ABS: 0.1 10*3/uL (ref 0.0–0.1)
Basophil %: 0.8 %
EOS ABS: 0.3 10*3/uL (ref 0.0–0.7)
EOS PCT: 3.6 %
HCT: 37.9 % (ref 35.0–47.0)
HGB: 12.1 g/dL (ref 12.0–16.0)
LYMPHS PCT: 33.7 %
Lymphocyte #: 2.5 10*3/uL (ref 1.0–3.6)
MCH: 29.6 pg (ref 26.0–34.0)
MCHC: 32 g/dL (ref 32.0–36.0)
MCV: 92 fL (ref 80–100)
Monocyte #: 0.5 x10 3/mm (ref 0.2–0.9)
Monocyte %: 6.3 %
NEUTROS ABS: 4.1 10*3/uL (ref 1.4–6.5)
NEUTROS PCT: 55.6 %
Platelet: 292 10*3/uL (ref 150–440)
RBC: 4.1 10*6/uL (ref 3.80–5.20)
RDW: 15.4 % — ABNORMAL HIGH (ref 11.5–14.5)
WBC: 7.3 10*3/uL (ref 3.6–11.0)

## 2014-05-12 LAB — HEPATIC FUNCTION PANEL A (ARMC)
ALBUMIN: 3.4 g/dL (ref 3.4–5.0)
ALK PHOS: 48 U/L
ALT: 41 U/L
AST: 33 U/L (ref 15–37)
BILIRUBIN TOTAL: 0.2 mg/dL (ref 0.2–1.0)
Bilirubin, Direct: <0.1 mg/dL (ref 0.00–0.20)
Total Protein: 6.4 g/dL (ref 6.4–8.2)

## 2014-05-12 LAB — FERRITIN: FERRITIN (ARMC): 53 ng/mL (ref 8–388)

## 2014-05-12 LAB — TSH: Thyroid Stimulating Horm: 2.82 u[IU]/mL

## 2014-05-12 LAB — PROTIME-INR
INR: 0.9
Prothrombin Time: 12.3 secs (ref 11.5–14.7)

## 2014-05-13 ENCOUNTER — Observation Stay: Payer: Self-pay | Admitting: Internal Medicine

## 2014-05-13 LAB — COMPREHENSIVE METABOLIC PANEL
ALBUMIN: 2.9 g/dL — AB (ref 3.4–5.0)
ALT: 36 U/L
Alkaline Phosphatase: 40 U/L — ABNORMAL LOW
Anion Gap: 9 (ref 7–16)
BUN: 45 mg/dL — AB (ref 7–18)
Bilirubin,Total: 0.2 mg/dL (ref 0.2–1.0)
CO2: 24 mmol/L (ref 21–32)
CREATININE: 1.77 mg/dL — AB (ref 0.60–1.30)
Calcium, Total: 8.7 mg/dL (ref 8.5–10.1)
Chloride: 109 mmol/L — ABNORMAL HIGH (ref 98–107)
EGFR (Non-African Amer.): 33 — ABNORMAL LOW
GFR CALC AF AMER: 38 — AB
Glucose: 108 mg/dL — ABNORMAL HIGH (ref 65–99)
Osmolality: 295 (ref 275–301)
POTASSIUM: 5.8 mmol/L — AB (ref 3.5–5.1)
SGOT(AST): 41 U/L — ABNORMAL HIGH (ref 15–37)
Sodium: 142 mmol/L (ref 136–145)
Total Protein: 5.7 g/dL — ABNORMAL LOW (ref 6.4–8.2)

## 2014-05-13 LAB — CBC
HCT: 34.8 % — ABNORMAL LOW (ref 35.0–47.0)
HGB: 11.3 g/dL — AB (ref 12.0–16.0)
MCH: 29.8 pg (ref 26.0–34.0)
MCHC: 32.4 g/dL (ref 32.0–36.0)
MCV: 92 fL (ref 80–100)
Platelet: 257 10*3/uL (ref 150–440)
RBC: 3.78 10*6/uL — ABNORMAL LOW (ref 3.80–5.20)
RDW: 15.5 % — ABNORMAL HIGH (ref 11.5–14.5)
WBC: 7.6 10*3/uL (ref 3.6–11.0)

## 2014-05-13 LAB — BASIC METABOLIC PANEL
Anion Gap: 7 (ref 7–16)
BUN: 47 mg/dL — ABNORMAL HIGH (ref 7–18)
CREATININE: 1.84 mg/dL — AB (ref 0.60–1.30)
Calcium, Total: 8.5 mg/dL (ref 8.5–10.1)
Chloride: 109 mmol/L — ABNORMAL HIGH (ref 98–107)
Co2: 26 mmol/L (ref 21–32)
GFR CALC AF AMER: 36 — AB
GFR CALC NON AF AMER: 31 — AB
GLUCOSE: 135 mg/dL — AB (ref 65–99)
Osmolality: 297 (ref 275–301)
Potassium: 5.2 mmol/L — ABNORMAL HIGH (ref 3.5–5.1)
SODIUM: 142 mmol/L (ref 136–145)

## 2014-05-13 LAB — URINALYSIS, COMPLETE
Bacteria: NONE SEEN
Bilirubin,UR: NEGATIVE
Blood: NEGATIVE
Glucose,UR: NEGATIVE mg/dL (ref 0–75)
Leukocyte Esterase: NEGATIVE
Nitrite: NEGATIVE
Ph: 5 (ref 4.5–8.0)
Protein: 30
RBC,UR: 1 /HPF (ref 0–5)
Specific Gravity: 1.029 (ref 1.003–1.030)
Squamous Epithelial: 5

## 2014-05-13 LAB — TROPONIN I

## 2014-05-13 LAB — CK: CK, TOTAL: 141 U/L

## 2014-05-13 LAB — VALPROIC ACID LEVEL: VALPROIC ACID: 71 ug/mL

## 2014-05-14 LAB — CBC WITH DIFFERENTIAL/PLATELET
BASOS ABS: 0 10*3/uL (ref 0.0–0.1)
BASOS PCT: 0.6 %
EOS ABS: 0.2 10*3/uL (ref 0.0–0.7)
EOS PCT: 4.6 %
HCT: 28.7 % — ABNORMAL LOW (ref 35.0–47.0)
HGB: 9.1 g/dL — AB (ref 12.0–16.0)
LYMPHS PCT: 53 %
Lymphocyte #: 2.8 10*3/uL (ref 1.0–3.6)
MCH: 29.3 pg (ref 26.0–34.0)
MCHC: 31.6 g/dL — ABNORMAL LOW (ref 32.0–36.0)
MCV: 93 fL (ref 80–100)
MONO ABS: 0.4 x10 3/mm (ref 0.2–0.9)
Monocyte %: 7.9 %
NEUTROS ABS: 1.8 10*3/uL (ref 1.4–6.5)
Neutrophil %: 33.9 %
Platelet: 198 10*3/uL (ref 150–440)
RBC: 3.1 10*6/uL — AB (ref 3.80–5.20)
RDW: 15.5 % — AB (ref 11.5–14.5)
WBC: 5.3 10*3/uL (ref 3.6–11.0)

## 2014-05-14 LAB — BASIC METABOLIC PANEL
Anion Gap: 5 — ABNORMAL LOW (ref 7–16)
BUN: 36 mg/dL — ABNORMAL HIGH (ref 7–18)
CALCIUM: 8 mg/dL — AB (ref 8.5–10.1)
Chloride: 110 mmol/L — ABNORMAL HIGH (ref 98–107)
Co2: 25 mmol/L (ref 21–32)
Creatinine: 1.54 mg/dL — ABNORMAL HIGH (ref 0.60–1.30)
EGFR (African American): 45 — ABNORMAL LOW
EGFR (Non-African Amer.): 39 — ABNORMAL LOW
Glucose: 104 mg/dL — ABNORMAL HIGH (ref 65–99)
Osmolality: 288 (ref 275–301)
POTASSIUM: 5 mmol/L (ref 3.5–5.1)
SODIUM: 140 mmol/L (ref 136–145)

## 2014-05-18 LAB — AFP TUMOR MARKER: AFP-Tumor Marker: 3.8 ng/mL (ref 0.0–8.3)

## 2014-05-27 ENCOUNTER — Observation Stay: Payer: Self-pay | Admitting: Internal Medicine

## 2014-05-27 LAB — COMPREHENSIVE METABOLIC PANEL
ALBUMIN: 2.8 g/dL — AB (ref 3.4–5.0)
ALT: 48 U/L
Alkaline Phosphatase: 50 U/L
Anion Gap: 8 (ref 7–16)
BILIRUBIN TOTAL: 0.2 mg/dL (ref 0.2–1.0)
BUN: 26 mg/dL — ABNORMAL HIGH (ref 7–18)
CHLORIDE: 101 mmol/L (ref 98–107)
Calcium, Total: 8.7 mg/dL (ref 8.5–10.1)
Co2: 28 mmol/L (ref 21–32)
Creatinine: 1.61 mg/dL — ABNORMAL HIGH (ref 0.60–1.30)
GFR CALC AF AMER: 44 — AB
GFR CALC NON AF AMER: 36 — AB
Glucose: 130 mg/dL — ABNORMAL HIGH (ref 65–99)
Osmolality: 280 (ref 275–301)
POTASSIUM: 3.8 mmol/L (ref 3.5–5.1)
SGOT(AST): 37 U/L (ref 15–37)
Sodium: 137 mmol/L (ref 136–145)
Total Protein: 6 g/dL — ABNORMAL LOW (ref 6.4–8.2)

## 2014-05-27 LAB — URINALYSIS, COMPLETE
BACTERIA: NONE SEEN
BILIRUBIN, UR: NEGATIVE
Blood: NEGATIVE
Glucose,UR: NEGATIVE mg/dL (ref 0–75)
Nitrite: NEGATIVE
Ph: 5 (ref 4.5–8.0)
RBC,UR: 3 /HPF (ref 0–5)
Specific Gravity: 1.021 (ref 1.003–1.030)

## 2014-05-27 LAB — CBC
HCT: 33.2 % — AB (ref 35.0–47.0)
HGB: 10.9 g/dL — AB (ref 12.0–16.0)
MCH: 29.8 pg (ref 26.0–34.0)
MCHC: 32.9 g/dL (ref 32.0–36.0)
MCV: 91 fL (ref 80–100)
Platelet: 222 10*3/uL (ref 150–440)
RBC: 3.67 10*6/uL — ABNORMAL LOW (ref 3.80–5.20)
RDW: 14.8 % — ABNORMAL HIGH (ref 11.5–14.5)
WBC: 6.5 10*3/uL (ref 3.6–11.0)

## 2014-05-27 LAB — CK TOTAL AND CKMB (NOT AT ARMC)
CK, Total: 121 U/L
CK-MB: 1.3 ng/mL (ref 0.5–3.6)

## 2014-05-27 LAB — AMMONIA: AMMONIA, PLASMA: 43 umol/L — AB (ref 11–32)

## 2014-05-27 LAB — LIPASE, BLOOD: Lipase: 234 U/L (ref 73–393)

## 2014-05-27 LAB — TROPONIN I: Troponin-I: <0.02 ng/mL

## 2014-05-27 LAB — TSH: Thyroid Stimulating Horm: 1.65 u[IU]/mL

## 2014-05-27 LAB — VALPROIC ACID LEVEL: Valproic Acid: 78 ug/mL

## 2014-05-28 LAB — BASIC METABOLIC PANEL
Anion Gap: 6 — ABNORMAL LOW (ref 7–16)
BUN: 21 mg/dL — ABNORMAL HIGH (ref 7–18)
CALCIUM: 7.9 mg/dL — AB (ref 8.5–10.1)
CREATININE: 1.24 mg/dL (ref 0.60–1.30)
Chloride: 110 mmol/L — ABNORMAL HIGH (ref 98–107)
Co2: 28 mmol/L (ref 21–32)
EGFR (Non-African Amer.): 49 — ABNORMAL LOW
GFR CALC AF AMER: 59 — AB
GLUCOSE: 128 mg/dL — AB (ref 65–99)
Osmolality: 291 (ref 275–301)
POTASSIUM: 3.8 mmol/L (ref 3.5–5.1)
Sodium: 144 mmol/L (ref 136–145)

## 2014-05-28 LAB — CBC WITH DIFFERENTIAL/PLATELET
COMMENT - H1-COM1: NORMAL
Comment - H1-Com2: NORMAL
EOS PCT: 3 %
HCT: 31.9 % — AB (ref 35.0–47.0)
HGB: 10.4 g/dL — AB (ref 12.0–16.0)
LYMPHS PCT: 60 %
MCH: 30.1 pg (ref 26.0–34.0)
MCHC: 32.5 g/dL (ref 32.0–36.0)
MCV: 93 fL (ref 80–100)
Metamyelocyte: 1 %
Monocytes: 9 %
PLATELETS: 194 10*3/uL (ref 150–440)
RBC: 3.44 10*6/uL — ABNORMAL LOW (ref 3.80–5.20)
RDW: 14.9 % — ABNORMAL HIGH (ref 11.5–14.5)
SEGMENTED NEUTROPHILS: 27 %
WBC: 4.8 10*3/uL (ref 3.6–11.0)

## 2014-05-28 LAB — D-DIMER(ARMC): D-Dimer: 186 ng/ml

## 2014-05-29 LAB — URINE CULTURE

## 2014-06-01 LAB — CULTURE, BLOOD (SINGLE)

## 2014-06-04 ENCOUNTER — Ambulatory Visit: Payer: Self-pay | Admitting: Hospice and Palliative Medicine

## 2014-06-10 ENCOUNTER — Inpatient Hospital Stay: Payer: Self-pay | Admitting: Internal Medicine

## 2014-06-10 LAB — URINALYSIS, COMPLETE
BLOOD: NEGATIVE
Bilirubin,UR: NEGATIVE
Glucose,UR: >=500 mg/dL (ref 0–75)
Nitrite: NEGATIVE
Ph: 5 (ref 4.5–8.0)
Protein: NEGATIVE
RBC,UR: 11 /HPF (ref 0–5)
Specific Gravity: 1.033 (ref 1.003–1.030)
WBC UR: 85 /HPF (ref 0–5)

## 2014-06-10 LAB — CBC
HCT: 33.5 % — ABNORMAL LOW (ref 35.0–47.0)
HGB: 10.7 g/dL — ABNORMAL LOW (ref 12.0–16.0)
MCH: 29.5 pg (ref 26.0–34.0)
MCHC: 32 g/dL (ref 32.0–36.0)
MCV: 92 fL (ref 80–100)
PLATELETS: 243 10*3/uL (ref 150–440)
RBC: 3.64 10*6/uL — AB (ref 3.80–5.20)
RDW: 14.4 % (ref 11.5–14.5)
WBC: 6.4 10*3/uL (ref 3.6–11.0)

## 2014-06-10 LAB — COMPREHENSIVE METABOLIC PANEL
ALBUMIN: 2.7 g/dL — AB (ref 3.4–5.0)
ALK PHOS: 64 U/L
ANION GAP: 8 (ref 7–16)
BUN: 28 mg/dL — ABNORMAL HIGH (ref 7–18)
Bilirubin,Total: 0.3 mg/dL (ref 0.2–1.0)
CALCIUM: 8.7 mg/dL (ref 8.5–10.1)
CHLORIDE: 99 mmol/L (ref 98–107)
CREATININE: 1.26 mg/dL (ref 0.60–1.30)
Co2: 29 mmol/L (ref 21–32)
EGFR (Non-African Amer.): 48 — ABNORMAL LOW
GFR CALC AF AMER: 58 — AB
GLUCOSE: 418 mg/dL — AB (ref 65–99)
Osmolality: 295 (ref 275–301)
Potassium: 4.3 mmol/L (ref 3.5–5.1)
SGOT(AST): 17 U/L (ref 15–37)
SGPT (ALT): 35 U/L
Sodium: 136 mmol/L (ref 136–145)
Total Protein: 5.5 g/dL — ABNORMAL LOW (ref 6.4–8.2)

## 2014-06-10 LAB — LIPASE, BLOOD: Lipase: 310 U/L (ref 73–393)

## 2014-06-10 LAB — AMMONIA: Ammonia, Plasma: 70 mcmol/L — ABNORMAL HIGH (ref 11–32)

## 2014-06-10 IMAGING — CR DG CHEST 2V
1 series · 2 of 2 positions shown · non-contrast
Comparison: none

REASON FOR EXAM: SOB
COMMENTS:

[Series 1: pa · 0.17mm/px · 2 of 2 slices shown]
[im 1/2]
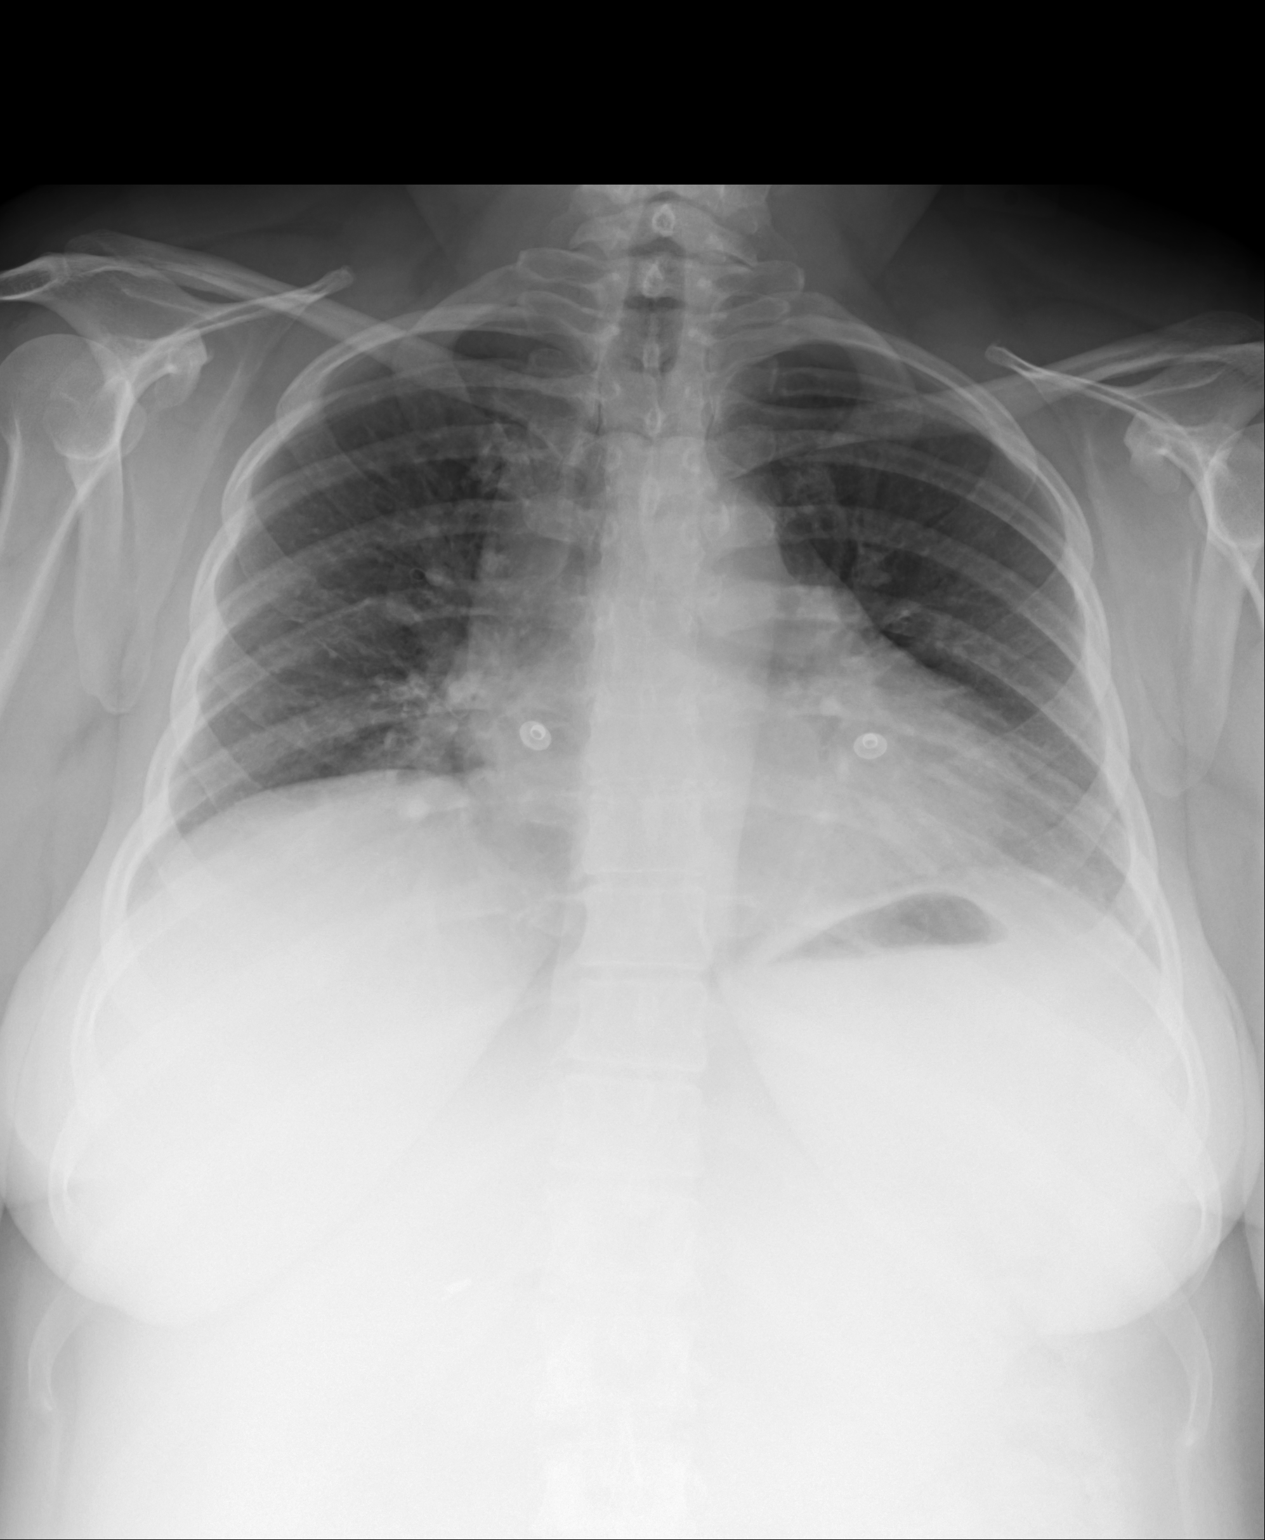
[im 2/2]
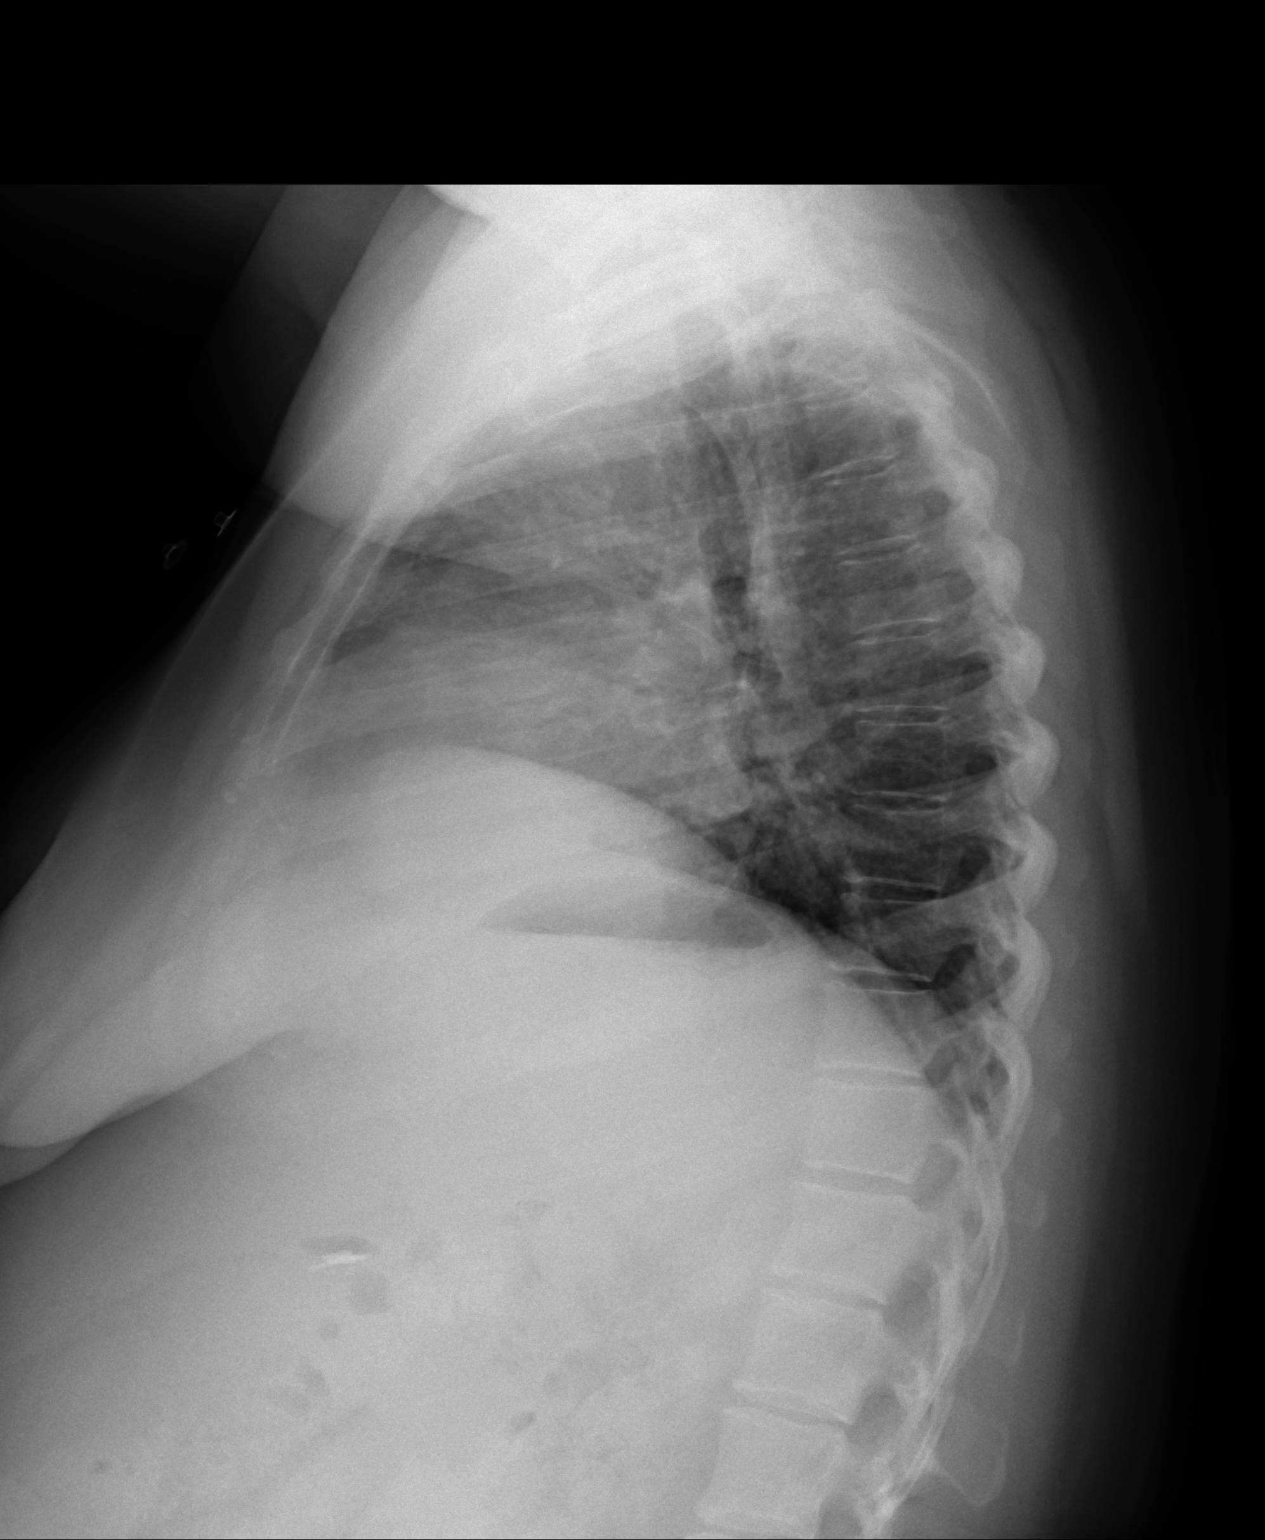

[2 of 2 positions shown; findings below may reference images not displayed]

PROCEDURE:     DXR - DXR CHEST PA (OR AP) AND LATERAL  - November 06, 2012  [DATE]

RESULT:     Comparison is made to the previous exam dated 01 September, 2012.
There is shallow inspiration. The lungs are clear. The heart and pulmonary
vessels are normal. The bony and mediastinal structures are unremarkable.
There is no effusion. There is no pneumothorax or evidence of congestive
failure.
IMPRESSION: No acute cardiopulmonary disease.

[REDACTED]

## 2014-06-11 LAB — CBC WITH DIFFERENTIAL/PLATELET
BASOS ABS: 1 %
Bands: 1 %
Eosinophil: 5 %
HCT: 32.5 % — AB (ref 35.0–47.0)
HGB: 10.3 g/dL — AB (ref 12.0–16.0)
Lymphocytes: 51 %
MCH: 29.1 pg (ref 26.0–34.0)
MCHC: 31.5 g/dL — ABNORMAL LOW (ref 32.0–36.0)
MCV: 92 fL (ref 80–100)
METAMYELOCYTE: 2 %
Monocytes: 10 %
NRBC/100 WBC: 1 /
PLATELETS: 230 10*3/uL (ref 150–440)
RBC: 3.52 10*6/uL — ABNORMAL LOW (ref 3.80–5.20)
RDW: 14.7 % — ABNORMAL HIGH (ref 11.5–14.5)
SEGMENTED NEUTROPHILS: 30 %
WBC: 5 10*3/uL (ref 3.6–11.0)

## 2014-06-11 LAB — AMMONIA: Ammonia, Plasma: 24 mcmol/L (ref 11–32)

## 2014-06-11 LAB — COMPREHENSIVE METABOLIC PANEL
ALBUMIN: 2.6 g/dL — AB (ref 3.4–5.0)
ALT: 32 U/L
Alkaline Phosphatase: 47 U/L
Anion Gap: 6 — ABNORMAL LOW (ref 7–16)
BUN: 22 mg/dL — ABNORMAL HIGH (ref 7–18)
Bilirubin,Total: 0.2 mg/dL (ref 0.2–1.0)
CALCIUM: 8.3 mg/dL — AB (ref 8.5–10.1)
CO2: 31 mmol/L (ref 21–32)
Chloride: 105 mmol/L (ref 98–107)
Creatinine: 1.01 mg/dL (ref 0.60–1.30)
EGFR (African American): >60
EGFR (Non-African Amer.): >60
Glucose: 205 mg/dL — ABNORMAL HIGH (ref 65–99)
Osmolality: 292 (ref 275–301)
Potassium: 4.3 mmol/L (ref 3.5–5.1)
SGOT(AST): 20 U/L (ref 15–37)
Sodium: 142 mmol/L (ref 136–145)
Total Protein: 5 g/dL — ABNORMAL LOW (ref 6.4–8.2)

## 2014-06-11 LAB — HEMOGLOBIN A1C: Hemoglobin A1C: 9.6 % — ABNORMAL HIGH (ref 4.2–6.3)

## 2014-06-12 LAB — GLUCOSE, POCT (MANUAL RESULT ENTRY): POC GLUCOSE: 206 mg/dL — AB (ref 70–99)

## 2014-06-12 LAB — URINE CULTURE

## 2014-06-17 ENCOUNTER — Ambulatory Visit: Payer: Self-pay | Admitting: Gastroenterology

## 2014-06-18 LAB — PATHOLOGY REPORT

## 2014-06-19 ENCOUNTER — Inpatient Hospital Stay: Payer: Self-pay | Admitting: Specialist

## 2014-06-19 LAB — CBC
HCT: 36.6 % (ref 35.0–47.0)
HGB: 12 g/dL (ref 12.0–16.0)
MCH: 30.2 pg (ref 26.0–34.0)
MCHC: 32.8 g/dL (ref 32.0–36.0)
MCV: 92 fL (ref 80–100)
Platelet: 283 10*3/uL (ref 150–440)
RBC: 3.97 10*6/uL (ref 3.80–5.20)
RDW: 14.4 % (ref 11.5–14.5)
WBC: 7.3 10*3/uL (ref 3.6–11.0)

## 2014-06-19 LAB — COMPREHENSIVE METABOLIC PANEL
ANION GAP: 14 (ref 7–16)
Albumin: 3.1 g/dL — ABNORMAL LOW (ref 3.4–5.0)
Alkaline Phosphatase: 74 U/L
BUN: 28 mg/dL — ABNORMAL HIGH (ref 7–18)
Bilirubin,Total: 0.4 mg/dL (ref 0.2–1.0)
CHLORIDE: 90 mmol/L — AB (ref 98–107)
CO2: 26 mmol/L (ref 21–32)
Calcium, Total: 9.3 mg/dL (ref 8.5–10.1)
Creatinine: 1.15 mg/dL (ref 0.60–1.30)
GFR CALC NON AF AMER: 53 — AB
GLUCOSE: 469 mg/dL — AB (ref 65–99)
OSMOLALITY: 287 (ref 275–301)
Potassium: 4.3 mmol/L (ref 3.5–5.1)
SGOT(AST): 37 U/L (ref 15–37)
SGPT (ALT): 46 U/L
SODIUM: 130 mmol/L — AB (ref 136–145)
Total Protein: 6.6 g/dL (ref 6.4–8.2)

## 2014-06-19 LAB — URINALYSIS, COMPLETE
BACTERIA: NONE SEEN
BILIRUBIN, UR: NEGATIVE
Blood: NEGATIVE
Glucose,UR: >=500 mg/dL (ref 0–75)
LEUKOCYTE ESTERASE: NEGATIVE
Nitrite: NEGATIVE
Ph: 5 (ref 4.5–8.0)
Protein: NEGATIVE
RBC,UR: 7 /HPF (ref 0–5)
Specific Gravity: 1.033 (ref 1.003–1.030)
Squamous Epithelial: 8

## 2014-06-19 LAB — AMMONIA: AMMONIA, PLASMA: 174 umol/L — AB (ref 11–32)

## 2014-06-20 LAB — CBC WITH DIFFERENTIAL/PLATELET
Basophil: 1 %
Eosinophil: 4 %
HCT: 36.6 % (ref 35.0–47.0)
HGB: 11.6 g/dL — ABNORMAL LOW (ref 12.0–16.0)
Lymphocytes: 53 %
MCH: 28.7 pg (ref 26.0–34.0)
MCHC: 31.7 g/dL — ABNORMAL LOW (ref 32.0–36.0)
MCV: 91 fL (ref 80–100)
Metamyelocyte: 1 %
Monocytes: 10 %
Platelet: 227 10*3/uL (ref 150–440)
RBC: 4.05 10*6/uL (ref 3.80–5.20)
RDW: 14.3 % (ref 11.5–14.5)
SEGMENTED NEUTROPHILS: 31 %
WBC: 5.4 10*3/uL (ref 3.6–11.0)

## 2014-06-20 LAB — BASIC METABOLIC PANEL
Anion Gap: 11 (ref 7–16)
BUN: 23 mg/dL — AB (ref 7–18)
CHLORIDE: 98 mmol/L (ref 98–107)
CREATININE: 0.97 mg/dL (ref 0.60–1.30)
Calcium, Total: 8.9 mg/dL (ref 8.5–10.1)
Co2: 27 mmol/L (ref 21–32)
EGFR (Non-African Amer.): >60
GLUCOSE: 347 mg/dL — AB (ref 65–99)
Osmolality: 289 (ref 275–301)
Potassium: 4.2 mmol/L (ref 3.5–5.1)
Sodium: 136 mmol/L (ref 136–145)

## 2014-06-20 LAB — AMMONIA: AMMONIA, PLASMA: 64 umol/L — AB (ref 11–32)

## 2014-06-22 ENCOUNTER — Ambulatory Visit: Payer: Self-pay | Admitting: Endocrinology

## 2014-06-22 DIAGNOSIS — Z0289 Encounter for other administrative examinations: Secondary | ICD-10-CM

## 2014-06-25 ENCOUNTER — Ambulatory Visit: Payer: Self-pay | Admitting: Gastroenterology

## 2014-06-27 ENCOUNTER — Inpatient Hospital Stay: Payer: Self-pay | Admitting: Internal Medicine

## 2014-06-27 LAB — CBC
HCT: 40.9 % (ref 35.0–47.0)
HGB: 13.3 g/dL (ref 12.0–16.0)
MCH: 30.1 pg (ref 26.0–34.0)
MCHC: 32.5 g/dL (ref 32.0–36.0)
MCV: 93 fL (ref 80–100)
Platelet: 273 10*3/uL (ref 150–440)
RBC: 4.42 10*6/uL (ref 3.80–5.20)
RDW: 14.7 % — ABNORMAL HIGH (ref 11.5–14.5)
WBC: 6.2 10*3/uL (ref 3.6–11.0)

## 2014-06-27 LAB — COMPREHENSIVE METABOLIC PANEL
ALBUMIN: 3.2 g/dL — AB (ref 3.4–5.0)
ANION GAP: 13 (ref 7–16)
Alkaline Phosphatase: 82 U/L
BUN: 26 mg/dL — AB (ref 7–18)
Bilirubin,Total: 0.5 mg/dL (ref 0.2–1.0)
CREATININE: 0.97 mg/dL (ref 0.60–1.30)
Calcium, Total: 9.4 mg/dL (ref 8.5–10.1)
Chloride: 91 mmol/L — ABNORMAL LOW (ref 98–107)
Co2: 25 mmol/L (ref 21–32)
EGFR (African American): >60
EGFR (Non-African Amer.): >60
GLUCOSE: 497 mg/dL — AB (ref 65–99)
Osmolality: 286 (ref 275–301)
POTASSIUM: 5.9 mmol/L — AB (ref 3.5–5.1)
SGOT(AST): 54 U/L — ABNORMAL HIGH (ref 15–37)
SGPT (ALT): 33 U/L
SODIUM: 129 mmol/L — AB (ref 136–145)
Total Protein: 7 g/dL (ref 6.4–8.2)

## 2014-06-27 LAB — URINALYSIS, COMPLETE
BILIRUBIN, UR: NEGATIVE
BLOOD: NEGATIVE
Bacteria: NONE SEEN
Glucose,UR: >=500 mg/dL (ref 0–75)
LEUKOCYTE ESTERASE: NEGATIVE
NITRITE: NEGATIVE
Ph: 5 (ref 4.5–8.0)
Protein: NEGATIVE
RBC, UR: NONE SEEN /HPF (ref 0–5)
SPECIFIC GRAVITY: 1.036 (ref 1.003–1.030)
Squamous Epithelial: <1

## 2014-06-27 LAB — DRUG SCREEN, URINE
AMPHETAMINES, UR SCREEN: NEGATIVE (ref ?–1000)
BENZODIAZEPINE, UR SCRN: POSITIVE (ref ?–200)
Barbiturates, Ur Screen: NEGATIVE (ref ?–200)
Cannabinoid 50 Ng, Ur ~~LOC~~: NEGATIVE (ref ?–50)
Cocaine Metabolite,Ur ~~LOC~~: NEGATIVE (ref ?–300)
MDMA (Ecstasy)Ur Screen: NEGATIVE (ref ?–500)
METHADONE, UR SCREEN: NEGATIVE (ref ?–300)
Opiate, Ur Screen: NEGATIVE (ref ?–300)
Phencyclidine (PCP) Ur S: NEGATIVE (ref ?–25)
TRICYCLIC, UR SCREEN: NEGATIVE (ref ?–1000)

## 2014-06-27 LAB — ETHANOL: Ethanol: <3 mg/dL

## 2014-06-27 LAB — PREGNANCY, URINE: Pregnancy Test, Urine: NEGATIVE m[IU]/mL

## 2014-06-27 LAB — MAGNESIUM: Magnesium: 1.6 mg/dL — ABNORMAL LOW

## 2014-06-27 LAB — PROTIME-INR
INR: 0.9
PROTHROMBIN TIME: 12.2 s (ref 11.5–14.7)

## 2014-06-27 LAB — PHOSPHORUS: Phosphorus: 3.6 mg/dL (ref 2.5–4.9)

## 2014-06-27 LAB — TROPONIN I

## 2014-06-27 LAB — AMMONIA: AMMONIA, PLASMA: 85 umol/L — AB (ref 11–32)

## 2014-06-28 ENCOUNTER — Ambulatory Visit: Payer: Self-pay | Admitting: Internal Medicine

## 2014-06-28 LAB — CBC WITH DIFFERENTIAL/PLATELET
BASOS ABS: 0.1 10*3/uL (ref 0.0–0.1)
BASOS PCT: 1.9 %
Eosinophil #: 0.4 10*3/uL (ref 0.0–0.7)
Eosinophil %: 6.5 %
HCT: 34.2 % — ABNORMAL LOW (ref 35.0–47.0)
HGB: 11 g/dL — ABNORMAL LOW (ref 12.0–16.0)
Lymphocyte #: 1.6 10*3/uL (ref 1.0–3.6)
Lymphocyte %: 24 %
MCH: 29.8 pg (ref 26.0–34.0)
MCHC: 32.2 g/dL (ref 32.0–36.0)
MCV: 93 fL (ref 80–100)
Monocyte #: 0.6 x10 3/mm (ref 0.2–0.9)
Monocyte %: 9.7 %
NEUTROS ABS: 3.8 10*3/uL (ref 1.4–6.5)
Neutrophil %: 57.9 %
Platelet: 242 10*3/uL (ref 150–440)
RBC: 3.7 10*6/uL — AB (ref 3.80–5.20)
RDW: 14.7 % — AB (ref 11.5–14.5)
WBC: 6.6 10*3/uL (ref 3.6–11.0)

## 2014-06-28 LAB — BASIC METABOLIC PANEL
ANION GAP: 8 (ref 7–16)
Anion Gap: 10 (ref 7–16)
Anion Gap: 8 (ref 7–16)
Anion Gap: 9 (ref 7–16)
BUN: 13 mg/dL (ref 7–18)
BUN: 15 mg/dL (ref 7–18)
BUN: 18 mg/dL (ref 7–18)
BUN: 22 mg/dL — ABNORMAL HIGH (ref 7–18)
CALCIUM: 8.7 mg/dL (ref 8.5–10.1)
CALCIUM: 8.7 mg/dL (ref 8.5–10.1)
CO2: 31 mmol/L (ref 21–32)
CO2: 28 mmol/L (ref 21–32)
CREATININE: 0.86 mg/dL (ref 0.60–1.30)
CREATININE: 1.01 mg/dL (ref 0.60–1.30)
CREATININE: 1.04 mg/dL (ref 0.60–1.30)
Calcium, Total: 8.3 mg/dL — ABNORMAL LOW (ref 8.5–10.1)
Calcium, Total: 8.7 mg/dL (ref 8.5–10.1)
Chloride: 100 mmol/L (ref 98–107)
Chloride: 100 mmol/L (ref 98–107)
Chloride: 102 mmol/L (ref 98–107)
Chloride: 99 mmol/L (ref 98–107)
Co2: 28 mmol/L (ref 21–32)
Co2: 30 mmol/L (ref 21–32)
Creatinine: 0.88 mg/dL (ref 0.60–1.30)
EGFR (African American): >60
EGFR (African American): >60
EGFR (African American): >60
EGFR (African American): >60
EGFR (Non-African Amer.): 60 — ABNORMAL LOW
EGFR (Non-African Amer.): >60
EGFR (Non-African Amer.): >60
GLUCOSE: 296 mg/dL — AB (ref 65–99)
Glucose: 315 mg/dL — ABNORMAL HIGH (ref 65–99)
Glucose: 321 mg/dL — ABNORMAL HIGH (ref 65–99)
Glucose: 392 mg/dL — ABNORMAL HIGH (ref 65–99)
OSMOLALITY: 288 (ref 275–301)
Osmolality: 287 (ref 275–301)
Osmolality: 292 (ref 275–301)
Osmolality: 295 (ref 275–301)
POTASSIUM: 4.1 mmol/L (ref 3.5–5.1)
Potassium: 4.7 mmol/L (ref 3.5–5.1)
Potassium: 4.2 mmol/L (ref 3.5–5.1)
Potassium: 4 mmol/L (ref 3.5–5.1)
SODIUM: 136 mmol/L (ref 136–145)
SODIUM: 138 mmol/L (ref 136–145)
Sodium: 141 mmol/L (ref 136–145)
Sodium: 138 mmol/L (ref 136–145)

## 2014-06-28 LAB — AMMONIA: AMMONIA, PLASMA: 92 umol/L — AB (ref 11–32)

## 2014-06-28 LAB — MAGNESIUM: MAGNESIUM: 1.7 mg/dL — AB

## 2014-06-29 LAB — CBC WITH DIFFERENTIAL/PLATELET
Basophil #: 0.1 10*3/uL (ref 0.0–0.1)
Basophil %: 1 %
EOS ABS: 0.4 10*3/uL (ref 0.0–0.7)
Eosinophil %: 7.4 %
HCT: 34.6 % — ABNORMAL LOW (ref 35.0–47.0)
HGB: 11.1 g/dL — ABNORMAL LOW (ref 12.0–16.0)
Lymphocyte #: 2.4 10*3/uL (ref 1.0–3.6)
Lymphocyte %: 40.7 %
MCH: 29.5 pg (ref 26.0–34.0)
MCHC: 32.1 g/dL (ref 32.0–36.0)
MCV: 92 fL (ref 80–100)
MONO ABS: 0.6 x10 3/mm (ref 0.2–0.9)
MONOS PCT: 9.9 %
NEUTROS ABS: 2.4 10*3/uL (ref 1.4–6.5)
Neutrophil %: 41 %
PLATELETS: 238 10*3/uL (ref 150–440)
RBC: 3.76 10*6/uL — AB (ref 3.80–5.20)
RDW: 14.4 % (ref 11.5–14.5)
WBC: 5.8 10*3/uL (ref 3.6–11.0)

## 2014-06-29 LAB — COMPREHENSIVE METABOLIC PANEL
ALBUMIN: 2.8 g/dL — AB (ref 3.4–5.0)
Alkaline Phosphatase: 65 U/L
Anion Gap: 8 (ref 7–16)
BILIRUBIN TOTAL: 0.3 mg/dL (ref 0.2–1.0)
BUN: 10 mg/dL (ref 7–18)
Calcium, Total: 8.9 mg/dL (ref 8.5–10.1)
Chloride: 101 mmol/L (ref 98–107)
Co2: 29 mmol/L (ref 21–32)
Creatinine: 0.95 mg/dL (ref 0.60–1.30)
EGFR (African American): >60
Glucose: 304 mg/dL — ABNORMAL HIGH (ref 65–99)
Osmolality: 286 (ref 275–301)
POTASSIUM: 4.3 mmol/L (ref 3.5–5.1)
SGOT(AST): 28 U/L (ref 15–37)
SGPT (ALT): 33 U/L
Sodium: 138 mmol/L (ref 136–145)
Total Protein: 5.7 g/dL — ABNORMAL LOW (ref 6.4–8.2)

## 2014-06-29 LAB — MAGNESIUM: Magnesium: 1.6 mg/dL — ABNORMAL LOW

## 2014-06-29 LAB — VANCOMYCIN, TROUGH: Vancomycin, Trough: 7 ug/mL — ABNORMAL LOW (ref 10–20)

## 2014-06-29 LAB — VALPROIC ACID LEVEL: Valproic Acid: 4 ug/mL — ABNORMAL LOW

## 2014-06-29 LAB — URINE CULTURE

## 2014-06-29 LAB — AMMONIA: Ammonia, Plasma: 51 mcmol/L — ABNORMAL HIGH (ref 11–32)

## 2014-06-30 LAB — AMMONIA: Ammonia, Plasma: 73 mcmol/L — ABNORMAL HIGH (ref 11–32)

## 2014-06-30 LAB — MAGNESIUM
MAGNESIUM: 1.6 mg/dL — AB
MAGNESIUM: 1.5 mg/dL — AB

## 2014-07-02 LAB — CULTURE, BLOOD (SINGLE)

## 2014-07-05 ENCOUNTER — Ambulatory Visit: Payer: Self-pay | Admitting: Hospice and Palliative Medicine

## 2014-07-07 ENCOUNTER — Ambulatory Visit: Payer: Medicare Other | Admitting: Neurology

## 2014-07-08 ENCOUNTER — Encounter: Payer: Self-pay | Admitting: Neurology

## 2014-08-22 IMAGING — CT CT CERVICAL SPINE WITHOUT CONTRAST
1 series · 12 of 14 positions shown, 15 images · non-contrast
Comparison: none

REASON FOR EXAM: neck pain
COMMENTS:

[Series 5: axial · axial · 0.20mm/px · z∈[-350,-211]mm · 12 of 86 slices shown, 15 images]
[im 7/86  soft-tissue]
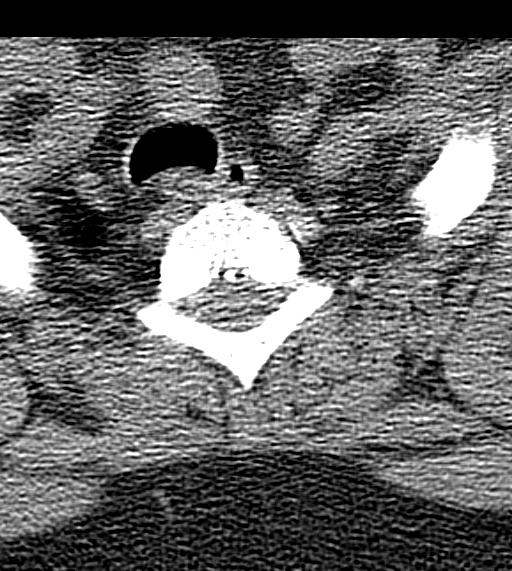
[im 7/86  bone]
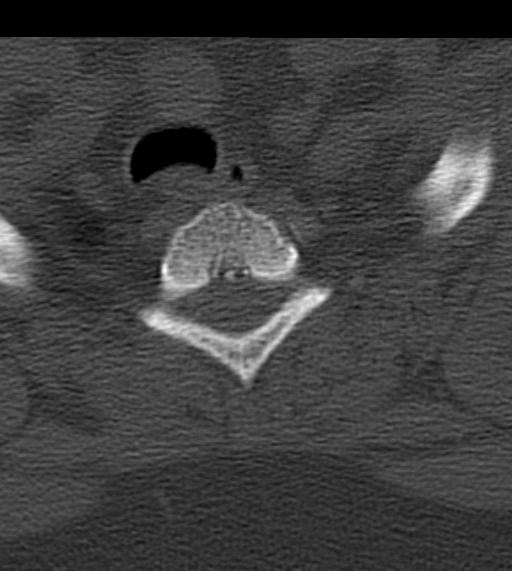
[im 14/86  bone]
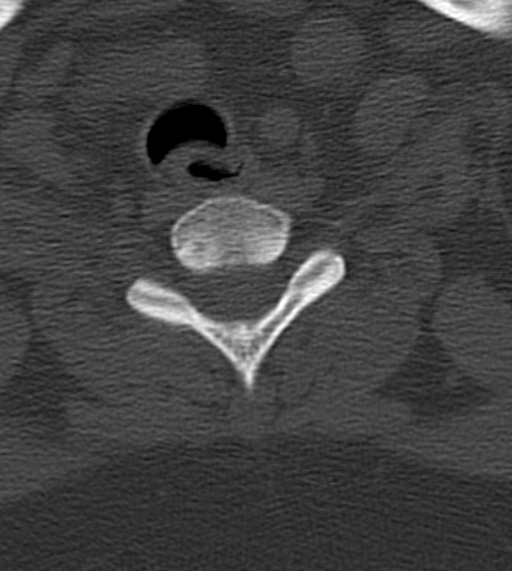
[im 20/86  bone]
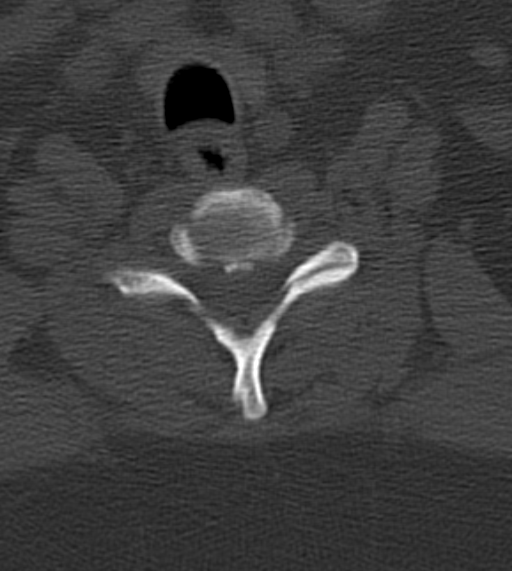
[im 27/86  bone]
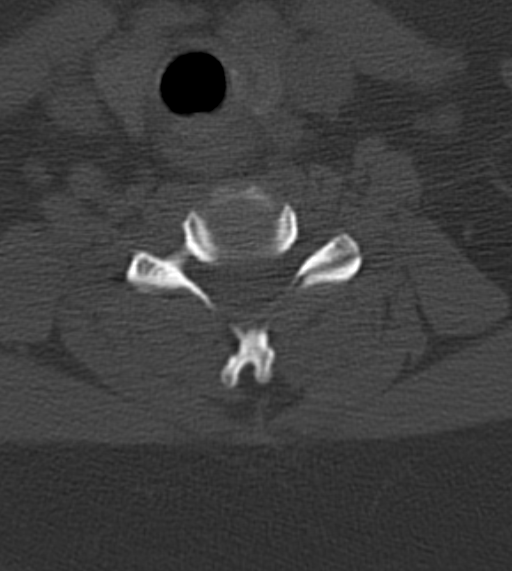
[im 33/86  soft-tissue]
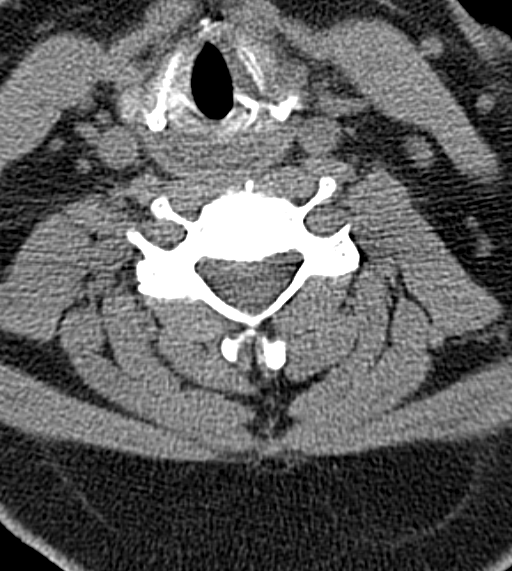
[im 33/86  bone]
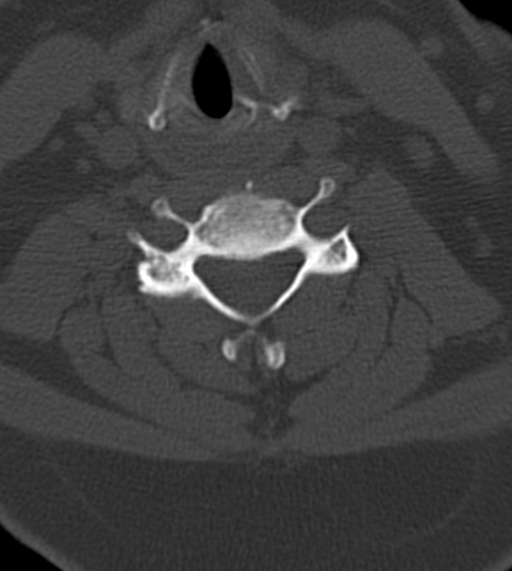
[im 40/86  bone]
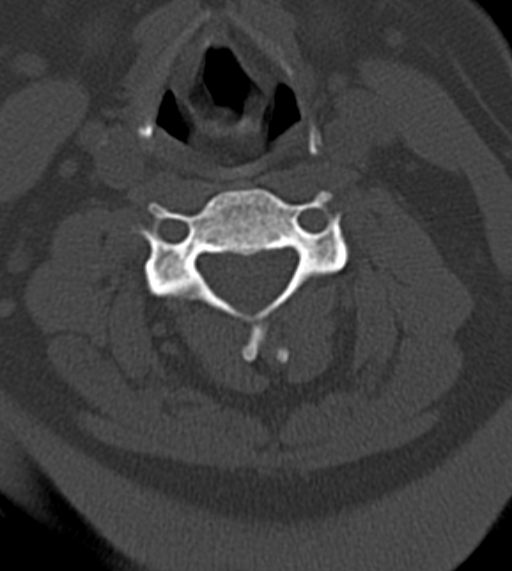
[im 46/86  bone]
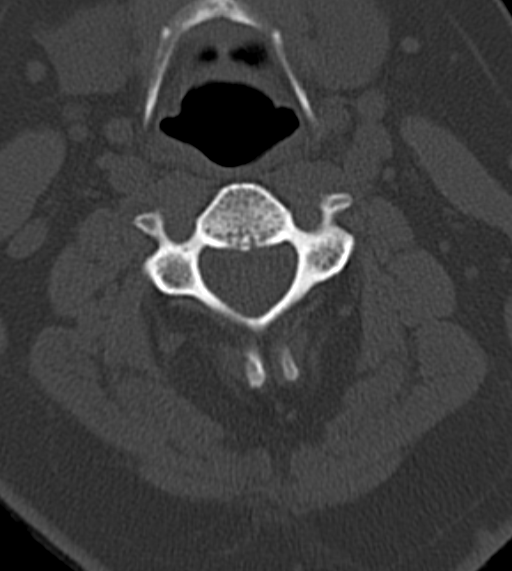
[im 53/86  bone]
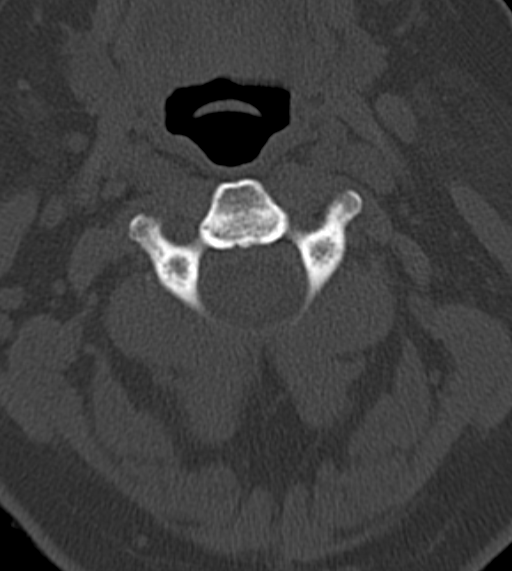
[im 59/86  soft-tissue]
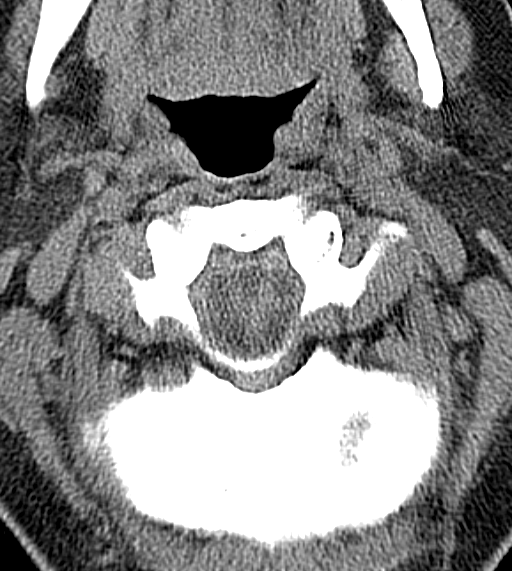
[im 59/86  bone]
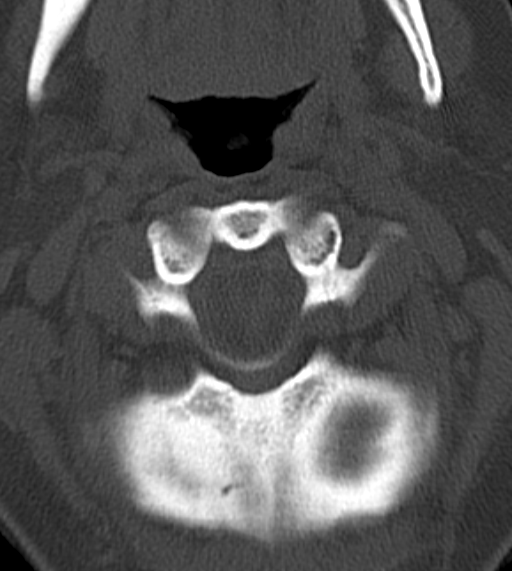
[im 66/86  bone]
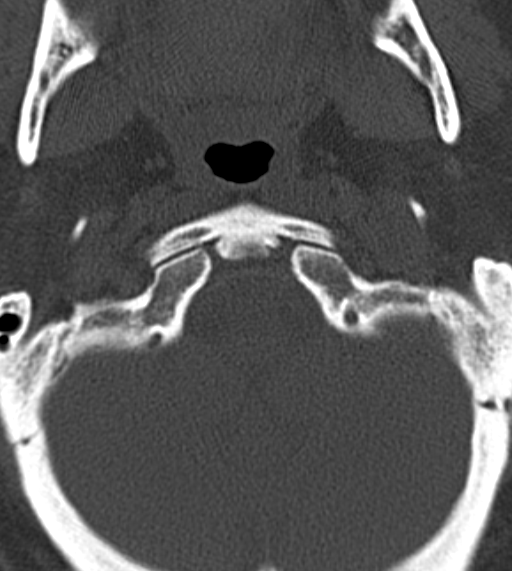
[im 72/86  bone]
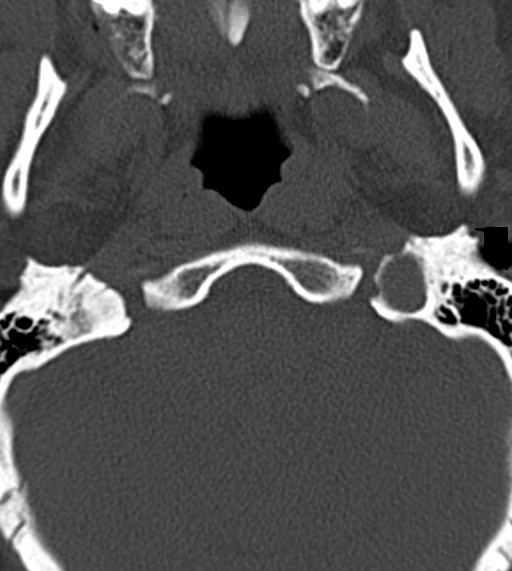
[im 79/86  bone]
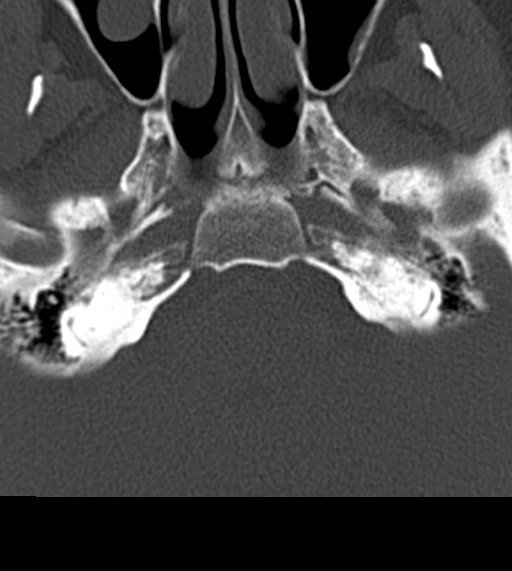

[12 of 14 positions shown; findings below may reference images not displayed]

PROCEDURE:     CT  - CT CERVICAL SPINE WO  - January 18, 2013  [DATE]

RESULT:     Sagittal, axial, and coronal images through the cervical spine
are reviewed.

The cervical vertebral bodies are preserved in height. The intervertebral
disc space heights are well-maintained. The prevertebral soft tissue spaces
appear normal. There is no evidence of a perched facet or spinous process
fracture. The bony ring at each cervical level is intact. The odontoid is
intact and the lateral masses of C1 align normally with those of C2 . There
is a small amount of apical pleural thickening on the left. There are
degenerative changes of the temporomandibular joints. There are numerous
normal sized to borderline enlarged cervical lymph nodes.
IMPRESSION: There is no evidence of acute cervical spine fracture nor
dislocation.

[REDACTED]

## 2014-08-22 IMAGING — CR DG THORACIC SPINE 2-3V
1 series · 2 of 2 positions shown · non-contrast
Comparison: none

REASON FOR EXAM: back pain afterv fall
COMMENTS:

[Series 1: ap · 0.17mm/px · 2 of 2 slices shown]
[im 1/2]
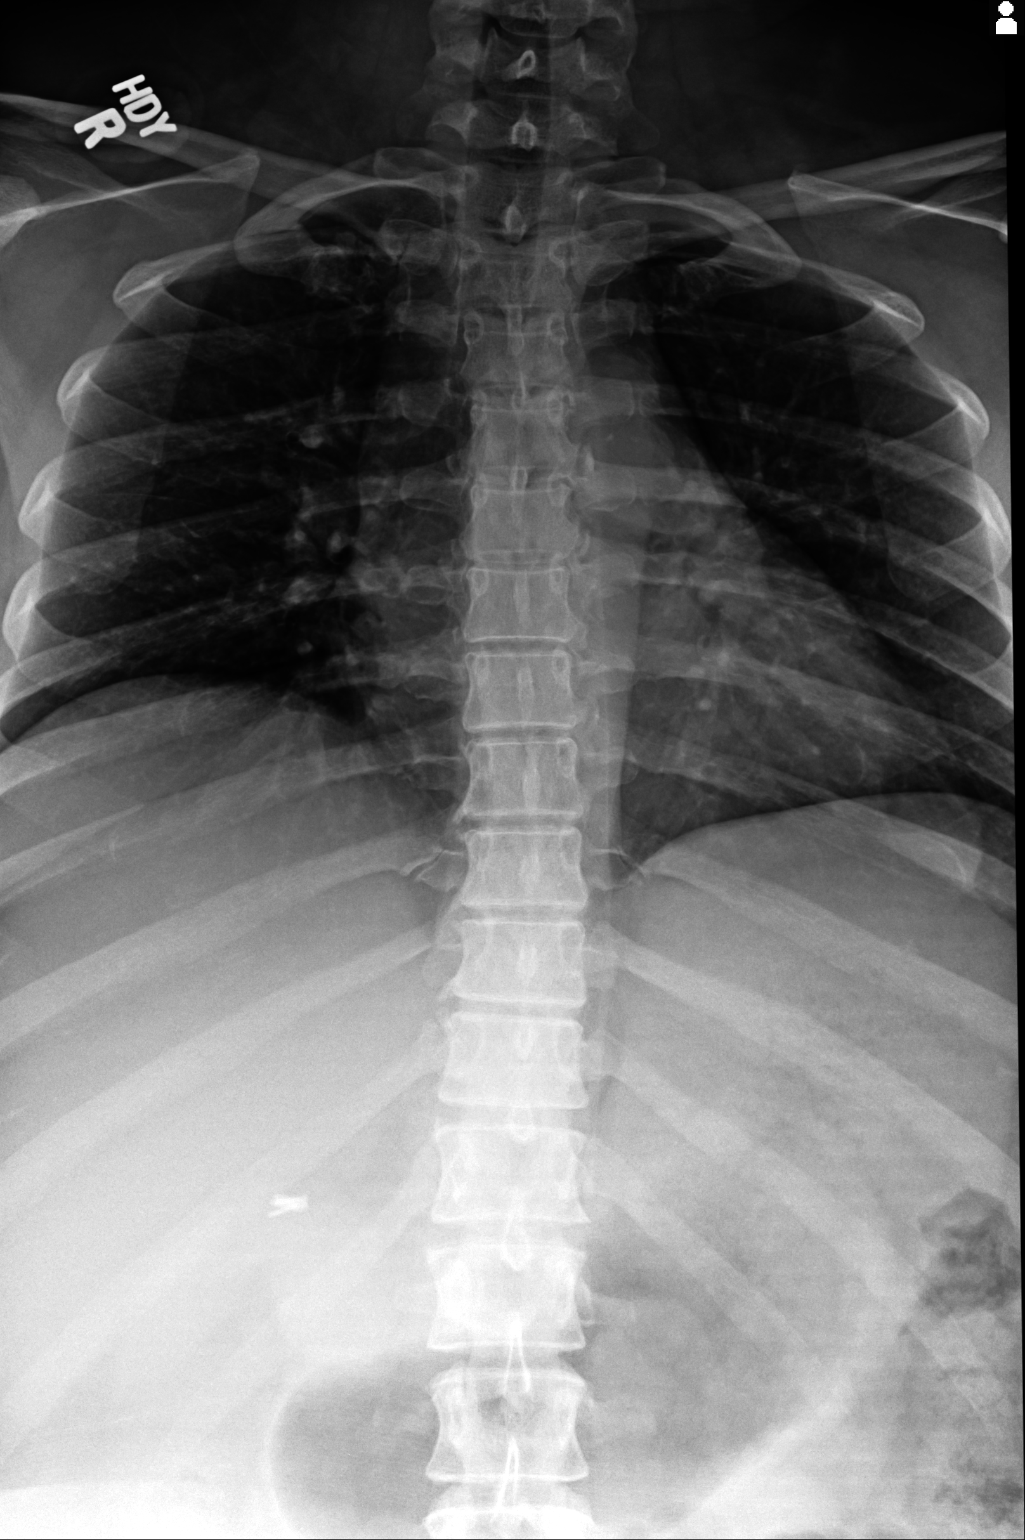
[im 2/2]
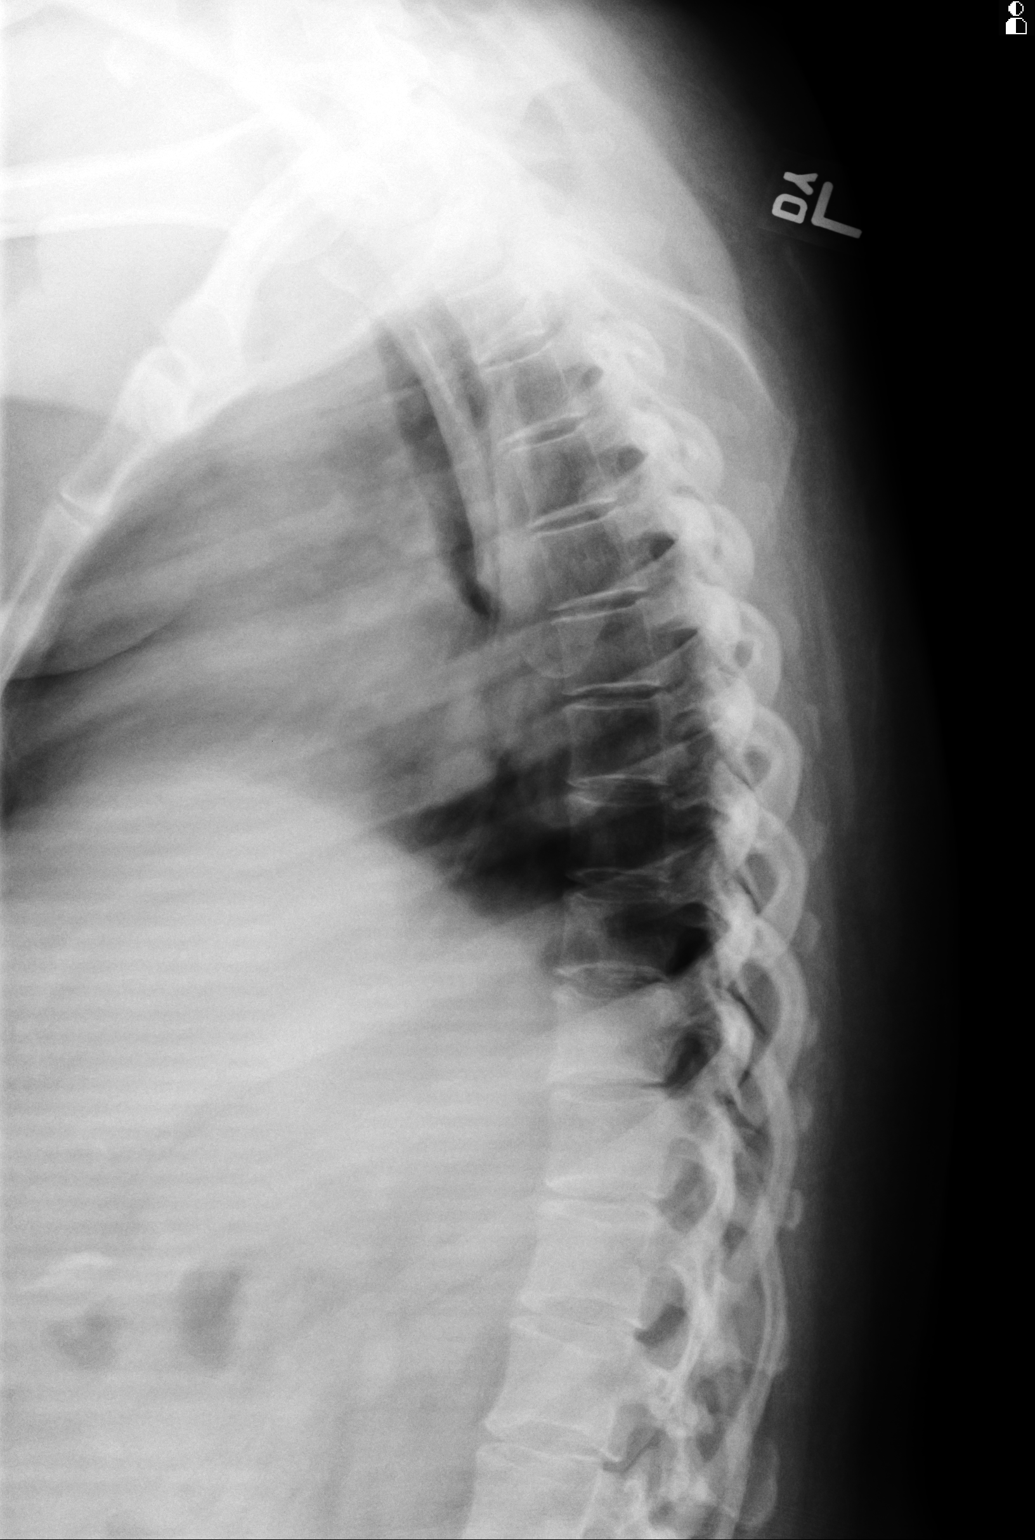

[2 of 2 positions shown; findings below may reference images not displayed]

PROCEDURE:     DXR - DXR THORACIC  AP AND LATERAL  - January 18, 2013  [DATE]

RESULT:     The thoracic vertebral bodies are preserved in height. The
pedicles appear intact. The intervertebral disc space heights are reasonably
well maintained. There are no abnormal paravertebral soft tissue densities.
IMPRESSION: There is no acute bony abnormality of the thoracic spine.

[REDACTED]

## 2014-08-29 ENCOUNTER — Emergency Department: Payer: Self-pay | Admitting: Emergency Medicine

## 2014-08-31 ENCOUNTER — Emergency Department: Payer: Self-pay | Admitting: Emergency Medicine

## 2014-09-11 DIAGNOSIS — E780 Pure hypercholesterolemia: Secondary | ICD-10-CM | POA: Diagnosis not present

## 2014-09-11 DIAGNOSIS — I10 Essential (primary) hypertension: Secondary | ICD-10-CM | POA: Diagnosis not present

## 2014-09-11 DIAGNOSIS — E118 Type 2 diabetes mellitus with unspecified complications: Secondary | ICD-10-CM | POA: Diagnosis not present

## 2014-09-11 DIAGNOSIS — E039 Hypothyroidism, unspecified: Secondary | ICD-10-CM | POA: Diagnosis not present

## 2014-09-11 DIAGNOSIS — E119 Type 2 diabetes mellitus without complications: Secondary | ICD-10-CM | POA: Diagnosis not present

## 2014-09-13 DIAGNOSIS — R400 Somnolence: Secondary | ICD-10-CM | POA: Diagnosis not present

## 2014-09-13 DIAGNOSIS — R509 Fever, unspecified: Secondary | ICD-10-CM | POA: Diagnosis not present

## 2014-09-13 DIAGNOSIS — R4182 Altered mental status, unspecified: Secondary | ICD-10-CM | POA: Diagnosis not present

## 2014-09-13 DIAGNOSIS — W19XXXA Unspecified fall, initial encounter: Secondary | ICD-10-CM | POA: Diagnosis not present

## 2014-09-13 DIAGNOSIS — I959 Hypotension, unspecified: Secondary | ICD-10-CM | POA: Diagnosis not present

## 2014-09-13 LAB — CBC
HCT: 36.4 % (ref 35.0–47.0)
HGB: 12 g/dL (ref 12.0–16.0)
MCH: 29.5 pg (ref 26.0–34.0)
MCHC: 33.1 g/dL (ref 32.0–36.0)
MCV: 89 fL (ref 80–100)
PLATELETS: 271 10*3/uL (ref 150–440)
RBC: 4.07 10*6/uL (ref 3.80–5.20)
RDW: 14.1 % (ref 11.5–14.5)
WBC: 8.7 10*3/uL (ref 3.6–11.0)

## 2014-09-13 LAB — COMPREHENSIVE METABOLIC PANEL
ALK PHOS: 160 U/L — AB
Albumin: 3.1 g/dL — ABNORMAL LOW (ref 3.4–5.0)
Anion Gap: 9 (ref 7–16)
BUN: 22 mg/dL — ABNORMAL HIGH (ref 7–18)
Bilirubin,Total: 0.4 mg/dL (ref 0.2–1.0)
Calcium, Total: 10.2 mg/dL — ABNORMAL HIGH (ref 8.5–10.1)
Chloride: 99 mmol/L (ref 98–107)
Co2: 26 mmol/L (ref 21–32)
Creatinine: 1.4 mg/dL — ABNORMAL HIGH (ref 0.60–1.30)
GFR CALC AF AMER: 51 — AB
GFR CALC NON AF AMER: 42 — AB
GLUCOSE: 216 mg/dL — AB (ref 65–99)
Osmolality: 278 (ref 275–301)
POTASSIUM: 3.9 mmol/L (ref 3.5–5.1)
SGOT(AST): 98 U/L — ABNORMAL HIGH (ref 15–37)
SGPT (ALT): 82 U/L — ABNORMAL HIGH
SODIUM: 134 mmol/L — AB (ref 136–145)
TOTAL PROTEIN: 6.9 g/dL (ref 6.4–8.2)

## 2014-09-13 LAB — AMMONIA: AMMONIA, PLASMA: 54 umol/L — AB (ref 11–32)

## 2014-09-13 LAB — TROPONIN I

## 2014-09-14 ENCOUNTER — Inpatient Hospital Stay: Payer: Self-pay | Admitting: Internal Medicine

## 2014-09-14 DIAGNOSIS — G9340 Encephalopathy, unspecified: Secondary | ICD-10-CM | POA: Diagnosis not present

## 2014-09-14 DIAGNOSIS — F329 Major depressive disorder, single episode, unspecified: Secondary | ICD-10-CM | POA: Diagnosis not present

## 2014-09-14 DIAGNOSIS — I959 Hypotension, unspecified: Secondary | ICD-10-CM | POA: Diagnosis not present

## 2014-09-14 DIAGNOSIS — R410 Disorientation, unspecified: Secondary | ICD-10-CM | POA: Diagnosis not present

## 2014-09-14 DIAGNOSIS — E039 Hypothyroidism, unspecified: Secondary | ICD-10-CM | POA: Diagnosis not present

## 2014-09-14 DIAGNOSIS — J449 Chronic obstructive pulmonary disease, unspecified: Secondary | ICD-10-CM | POA: Diagnosis not present

## 2014-09-14 DIAGNOSIS — E860 Dehydration: Secondary | ICD-10-CM | POA: Diagnosis not present

## 2014-09-14 DIAGNOSIS — I252 Old myocardial infarction: Secondary | ICD-10-CM | POA: Diagnosis not present

## 2014-09-14 DIAGNOSIS — K760 Fatty (change of) liver, not elsewhere classified: Secondary | ICD-10-CM | POA: Diagnosis not present

## 2014-09-14 DIAGNOSIS — G2581 Restless legs syndrome: Secondary | ICD-10-CM | POA: Diagnosis not present

## 2014-09-14 DIAGNOSIS — I2510 Atherosclerotic heart disease of native coronary artery without angina pectoris: Secondary | ICD-10-CM | POA: Diagnosis not present

## 2014-09-14 DIAGNOSIS — E119 Type 2 diabetes mellitus without complications: Secondary | ICD-10-CM | POA: Diagnosis not present

## 2014-09-14 DIAGNOSIS — Z794 Long term (current) use of insulin: Secondary | ICD-10-CM | POA: Diagnosis not present

## 2014-09-14 DIAGNOSIS — Z885 Allergy status to narcotic agent status: Secondary | ICD-10-CM | POA: Diagnosis not present

## 2014-09-14 DIAGNOSIS — H6693 Otitis media, unspecified, bilateral: Secondary | ICD-10-CM | POA: Diagnosis not present

## 2014-09-14 DIAGNOSIS — F319 Bipolar disorder, unspecified: Secondary | ICD-10-CM | POA: Diagnosis not present

## 2014-09-14 DIAGNOSIS — J018 Other acute sinusitis: Secondary | ICD-10-CM | POA: Diagnosis not present

## 2014-09-14 DIAGNOSIS — J019 Acute sinusitis, unspecified: Secondary | ICD-10-CM | POA: Diagnosis not present

## 2014-09-14 DIAGNOSIS — K7200 Acute and subacute hepatic failure without coma: Secondary | ICD-10-CM | POA: Diagnosis not present

## 2014-09-14 DIAGNOSIS — H7090 Unspecified mastoiditis, unspecified ear: Secondary | ICD-10-CM | POA: Diagnosis not present

## 2014-09-14 DIAGNOSIS — Z79891 Long term (current) use of opiate analgesic: Secondary | ICD-10-CM | POA: Diagnosis not present

## 2014-09-14 DIAGNOSIS — Z9981 Dependence on supplemental oxygen: Secondary | ICD-10-CM | POA: Diagnosis not present

## 2014-09-14 DIAGNOSIS — I10 Essential (primary) hypertension: Secondary | ICD-10-CM | POA: Diagnosis not present

## 2014-09-14 DIAGNOSIS — G4730 Sleep apnea, unspecified: Secondary | ICD-10-CM | POA: Diagnosis not present

## 2014-09-14 DIAGNOSIS — E869 Volume depletion, unspecified: Secondary | ICD-10-CM | POA: Diagnosis not present

## 2014-09-14 DIAGNOSIS — R400 Somnolence: Secondary | ICD-10-CM | POA: Diagnosis not present

## 2014-09-14 DIAGNOSIS — R4182 Altered mental status, unspecified: Secondary | ICD-10-CM | POA: Diagnosis not present

## 2014-09-14 DIAGNOSIS — E785 Hyperlipidemia, unspecified: Secondary | ICD-10-CM | POA: Diagnosis not present

## 2014-09-14 LAB — AMMONIA: AMMONIA, PLASMA: 29 umol/L (ref 11–32)

## 2014-09-14 LAB — URINALYSIS, COMPLETE
BILIRUBIN, UR: NEGATIVE
Blood: NEGATIVE
Glucose,UR: >=500 mg/dL (ref 0–75)
Leukocyte Esterase: NEGATIVE
Nitrite: NEGATIVE
Ph: 5 (ref 4.5–8.0)
RBC,UR: 4 /HPF (ref 0–5)
SPECIFIC GRAVITY: 1.03 (ref 1.003–1.030)
WBC UR: 7 /HPF (ref 0–5)

## 2014-09-14 LAB — SALICYLATE LEVEL: Salicylates, Serum: <1.7 mg/dL

## 2014-09-14 LAB — DRUG SCREEN, URINE
Amphetamines, Ur Screen: NEGATIVE (ref ?–1000)
Barbiturates, Ur Screen: NEGATIVE (ref ?–200)
Benzodiazepine, Ur Scrn: POSITIVE (ref ?–200)
Cannabinoid 50 Ng, Ur ~~LOC~~: NEGATIVE (ref ?–50)
Cocaine Metabolite,Ur ~~LOC~~: NEGATIVE (ref ?–300)
MDMA (Ecstasy)Ur Screen: NEGATIVE (ref ?–500)
METHADONE, UR SCREEN: NEGATIVE (ref ?–300)
Opiate, Ur Screen: POSITIVE (ref ?–300)
PHENCYCLIDINE (PCP) UR S: NEGATIVE (ref ?–25)
Tricyclic, Ur Screen: POSITIVE (ref ?–1000)

## 2014-09-14 LAB — ACETAMINOPHEN LEVEL: ACETAMINOPHEN: 10 ug/mL

## 2014-09-14 LAB — ETHANOL: Ethanol: <3 mg/dL

## 2014-09-17 DIAGNOSIS — J0140 Acute pansinusitis, unspecified: Secondary | ICD-10-CM | POA: Diagnosis not present

## 2014-09-17 DIAGNOSIS — H6982 Other specified disorders of Eustachian tube, left ear: Secondary | ICD-10-CM | POA: Diagnosis not present

## 2014-09-17 DIAGNOSIS — H902 Conductive hearing loss, unspecified: Secondary | ICD-10-CM | POA: Diagnosis not present

## 2014-09-17 DIAGNOSIS — H6502 Acute serous otitis media, left ear: Secondary | ICD-10-CM | POA: Diagnosis not present

## 2014-09-17 DIAGNOSIS — H9202 Otalgia, left ear: Secondary | ICD-10-CM | POA: Diagnosis not present

## 2014-09-18 LAB — CULTURE, BLOOD (SINGLE)

## 2014-09-19 DIAGNOSIS — G4733 Obstructive sleep apnea (adult) (pediatric): Secondary | ICD-10-CM | POA: Diagnosis not present

## 2014-09-19 DIAGNOSIS — R0902 Hypoxemia: Secondary | ICD-10-CM | POA: Diagnosis not present

## 2014-09-19 DIAGNOSIS — J449 Chronic obstructive pulmonary disease, unspecified: Secondary | ICD-10-CM | POA: Diagnosis not present

## 2014-09-30 DIAGNOSIS — B372 Candidiasis of skin and nail: Secondary | ICD-10-CM | POA: Diagnosis not present

## 2014-09-30 DIAGNOSIS — E781 Pure hyperglyceridemia: Secondary | ICD-10-CM | POA: Diagnosis not present

## 2014-09-30 DIAGNOSIS — J329 Chronic sinusitis, unspecified: Secondary | ICD-10-CM | POA: Diagnosis not present

## 2014-09-30 DIAGNOSIS — E1140 Type 2 diabetes mellitus with diabetic neuropathy, unspecified: Secondary | ICD-10-CM | POA: Diagnosis not present

## 2014-09-30 DIAGNOSIS — E039 Hypothyroidism, unspecified: Secondary | ICD-10-CM | POA: Diagnosis not present

## 2014-09-30 DIAGNOSIS — E559 Vitamin D deficiency, unspecified: Secondary | ICD-10-CM | POA: Diagnosis not present

## 2014-09-30 DIAGNOSIS — K7290 Hepatic failure, unspecified without coma: Secondary | ICD-10-CM | POA: Diagnosis not present

## 2014-09-30 DIAGNOSIS — Z794 Long term (current) use of insulin: Secondary | ICD-10-CM | POA: Diagnosis not present

## 2014-10-09 ENCOUNTER — Emergency Department: Payer: Self-pay | Admitting: Emergency Medicine

## 2014-10-09 DIAGNOSIS — E1165 Type 2 diabetes mellitus with hyperglycemia: Secondary | ICD-10-CM | POA: Diagnosis not present

## 2014-10-09 DIAGNOSIS — Z794 Long term (current) use of insulin: Secondary | ICD-10-CM | POA: Diagnosis not present

## 2014-10-09 DIAGNOSIS — I10 Essential (primary) hypertension: Secondary | ICD-10-CM | POA: Diagnosis not present

## 2014-10-09 DIAGNOSIS — Z79899 Other long term (current) drug therapy: Secondary | ICD-10-CM | POA: Diagnosis not present

## 2014-10-09 DIAGNOSIS — E1065 Type 1 diabetes mellitus with hyperglycemia: Secondary | ICD-10-CM | POA: Diagnosis not present

## 2014-10-09 DIAGNOSIS — Z7951 Long term (current) use of inhaled steroids: Secondary | ICD-10-CM | POA: Diagnosis not present

## 2014-10-09 DIAGNOSIS — E7220 Disorder of urea cycle metabolism, unspecified: Secondary | ICD-10-CM | POA: Diagnosis not present

## 2014-10-09 DIAGNOSIS — R42 Dizziness and giddiness: Secondary | ICD-10-CM | POA: Diagnosis not present

## 2014-10-09 LAB — URINALYSIS, COMPLETE
Bacteria: NONE SEEN
Bilirubin,UR: NEGATIVE
Blood: NEGATIVE
Glucose,UR: >=500 mg/dL (ref 0–75)
Ketone: NEGATIVE
LEUKOCYTE ESTERASE: NEGATIVE
Nitrite: NEGATIVE
PH: 5 (ref 4.5–8.0)
Protein: NEGATIVE
Specific Gravity: 1.026 (ref 1.003–1.030)
Squamous Epithelial: 2
WBC UR: 2 /HPF (ref 0–5)

## 2014-10-09 LAB — COMPREHENSIVE METABOLIC PANEL
ALBUMIN: 3.7 g/dL (ref 3.4–5.0)
ANION GAP: 12 (ref 7–16)
Alkaline Phosphatase: 96 U/L (ref 46–116)
BUN: 24 mg/dL — AB (ref 7–18)
Bilirubin,Total: 0.3 mg/dL (ref 0.2–1.0)
CREATININE: 1.04 mg/dL (ref 0.60–1.30)
Calcium, Total: 10 mg/dL (ref 8.5–10.1)
Chloride: 97 mmol/L — ABNORMAL LOW (ref 98–107)
Co2: 24 mmol/L (ref 21–32)
EGFR (Non-African Amer.): 60 — ABNORMAL LOW
Glucose: 391 mg/dL — ABNORMAL HIGH (ref 65–99)
OSMOLALITY: 287 (ref 275–301)
POTASSIUM: 4.7 mmol/L (ref 3.5–5.1)
SGOT(AST): 54 U/L — ABNORMAL HIGH (ref 15–37)
SGPT (ALT): 90 U/L — ABNORMAL HIGH (ref 14–63)
Sodium: 133 mmol/L — ABNORMAL LOW (ref 136–145)
Total Protein: 7.3 g/dL (ref 6.4–8.2)

## 2014-10-09 LAB — MAGNESIUM: Magnesium: 1.4 mg/dL — ABNORMAL LOW

## 2014-10-09 LAB — CBC WITH DIFFERENTIAL/PLATELET
BASOS ABS: 0.1 10*3/uL (ref 0.0–0.1)
BASOS PCT: 0.7 %
EOS ABS: 0.2 10*3/uL (ref 0.0–0.7)
Eosinophil %: 2 %
HCT: 40 % (ref 35.0–47.0)
HGB: 13.3 g/dL (ref 12.0–16.0)
LYMPHS ABS: 4.1 10*3/uL — AB (ref 1.0–3.6)
Lymphocyte %: 47.3 %
MCH: 29.2 pg (ref 26.0–34.0)
MCHC: 33.4 g/dL (ref 32.0–36.0)
MCV: 88 fL (ref 80–100)
MONO ABS: 0.5 x10 3/mm (ref 0.2–0.9)
Monocyte %: 5.6 %
NEUTROS ABS: 3.9 10*3/uL (ref 1.4–6.5)
Neutrophil %: 44.4 %
Platelet: 266 10*3/uL (ref 150–440)
RBC: 4.57 10*6/uL (ref 3.80–5.20)
RDW: 14.1 % (ref 11.5–14.5)
WBC: 8.7 10*3/uL (ref 3.6–11.0)

## 2014-10-09 LAB — TROPONIN I: Troponin-I: <0.02 ng/mL

## 2014-10-09 LAB — AMMONIA: Ammonia, Plasma: 51 mcmol/L — ABNORMAL HIGH (ref 11–32)

## 2014-10-09 LAB — T4, FREE: Free Thyroxine: 0.79 ng/dL (ref 0.76–1.46)

## 2014-10-09 LAB — TSH: Thyroid Stimulating Horm: 0.15 u[IU]/mL — ABNORMAL LOW

## 2014-10-13 ENCOUNTER — Ambulatory Visit: Payer: Self-pay | Admitting: Family Medicine

## 2014-10-13 DIAGNOSIS — R110 Nausea: Secondary | ICD-10-CM | POA: Diagnosis not present

## 2014-10-13 DIAGNOSIS — Z0000 Encounter for general adult medical examination without abnormal findings: Secondary | ICD-10-CM | POA: Diagnosis not present

## 2014-10-13 DIAGNOSIS — K5900 Constipation, unspecified: Secondary | ICD-10-CM | POA: Diagnosis not present

## 2014-10-13 DIAGNOSIS — R112 Nausea with vomiting, unspecified: Secondary | ICD-10-CM | POA: Diagnosis not present

## 2014-10-13 DIAGNOSIS — R109 Unspecified abdominal pain: Secondary | ICD-10-CM | POA: Diagnosis not present

## 2014-10-13 DIAGNOSIS — N898 Other specified noninflammatory disorders of vagina: Secondary | ICD-10-CM | POA: Diagnosis not present

## 2014-10-15 DIAGNOSIS — R05 Cough: Secondary | ICD-10-CM | POA: Diagnosis not present

## 2014-10-16 ENCOUNTER — Emergency Department: Payer: Self-pay | Admitting: Emergency Medicine

## 2014-10-16 DIAGNOSIS — R000 Tachycardia, unspecified: Secondary | ICD-10-CM | POA: Diagnosis not present

## 2014-10-16 DIAGNOSIS — Z79899 Other long term (current) drug therapy: Secondary | ICD-10-CM | POA: Diagnosis not present

## 2014-10-16 DIAGNOSIS — I10 Essential (primary) hypertension: Secondary | ICD-10-CM | POA: Diagnosis not present

## 2014-10-16 DIAGNOSIS — R110 Nausea: Secondary | ICD-10-CM | POA: Diagnosis not present

## 2014-10-16 DIAGNOSIS — Z794 Long term (current) use of insulin: Secondary | ICD-10-CM | POA: Diagnosis not present

## 2014-10-16 DIAGNOSIS — R531 Weakness: Secondary | ICD-10-CM | POA: Diagnosis not present

## 2014-10-16 DIAGNOSIS — R1084 Generalized abdominal pain: Secondary | ICD-10-CM | POA: Diagnosis not present

## 2014-10-16 DIAGNOSIS — Z90710 Acquired absence of both cervix and uterus: Secondary | ICD-10-CM | POA: Diagnosis not present

## 2014-10-16 DIAGNOSIS — E119 Type 2 diabetes mellitus without complications: Secondary | ICD-10-CM | POA: Diagnosis not present

## 2014-10-16 DIAGNOSIS — Z9089 Acquired absence of other organs: Secondary | ICD-10-CM | POA: Diagnosis not present

## 2014-10-17 DIAGNOSIS — R110 Nausea: Secondary | ICD-10-CM | POA: Diagnosis not present

## 2014-10-17 DIAGNOSIS — R000 Tachycardia, unspecified: Secondary | ICD-10-CM | POA: Diagnosis not present

## 2014-10-17 DIAGNOSIS — R1084 Generalized abdominal pain: Secondary | ICD-10-CM | POA: Diagnosis not present

## 2014-10-17 DIAGNOSIS — R531 Weakness: Secondary | ICD-10-CM | POA: Diagnosis not present

## 2014-10-20 DIAGNOSIS — J449 Chronic obstructive pulmonary disease, unspecified: Secondary | ICD-10-CM | POA: Diagnosis not present

## 2014-10-20 DIAGNOSIS — R0902 Hypoxemia: Secondary | ICD-10-CM | POA: Diagnosis not present

## 2014-10-20 DIAGNOSIS — G4733 Obstructive sleep apnea (adult) (pediatric): Secondary | ICD-10-CM | POA: Diagnosis not present

## 2014-10-22 DIAGNOSIS — M7551 Bursitis of right shoulder: Secondary | ICD-10-CM | POA: Diagnosis not present

## 2014-10-22 DIAGNOSIS — M778 Other enthesopathies, not elsewhere classified: Secondary | ICD-10-CM | POA: Diagnosis not present

## 2014-10-22 DIAGNOSIS — M7552 Bursitis of left shoulder: Secondary | ICD-10-CM | POA: Diagnosis not present

## 2014-10-27 DIAGNOSIS — K219 Gastro-esophageal reflux disease without esophagitis: Secondary | ICD-10-CM | POA: Diagnosis not present

## 2014-10-27 DIAGNOSIS — K7581 Nonalcoholic steatohepatitis (NASH): Secondary | ICD-10-CM | POA: Diagnosis not present

## 2014-10-27 DIAGNOSIS — R110 Nausea: Secondary | ICD-10-CM | POA: Diagnosis not present

## 2014-11-01 ENCOUNTER — Emergency Department: Payer: Self-pay | Admitting: Emergency Medicine

## 2014-11-01 DIAGNOSIS — R000 Tachycardia, unspecified: Secondary | ICD-10-CM | POA: Diagnosis not present

## 2014-11-01 DIAGNOSIS — K5669 Other intestinal obstruction: Secondary | ICD-10-CM | POA: Diagnosis not present

## 2014-11-01 DIAGNOSIS — R630 Anorexia: Secondary | ICD-10-CM | POA: Diagnosis not present

## 2014-11-01 DIAGNOSIS — R7309 Other abnormal glucose: Secondary | ICD-10-CM | POA: Diagnosis not present

## 2014-11-01 DIAGNOSIS — K760 Fatty (change of) liver, not elsewhere classified: Secondary | ICD-10-CM | POA: Diagnosis not present

## 2014-11-01 DIAGNOSIS — R0600 Dyspnea, unspecified: Secondary | ICD-10-CM | POA: Diagnosis not present

## 2014-11-01 DIAGNOSIS — R1031 Right lower quadrant pain: Secondary | ICD-10-CM | POA: Diagnosis not present

## 2014-11-01 DIAGNOSIS — K5900 Constipation, unspecified: Secondary | ICD-10-CM | POA: Diagnosis not present

## 2014-11-01 DIAGNOSIS — K6389 Other specified diseases of intestine: Secondary | ICD-10-CM | POA: Diagnosis not present

## 2014-11-01 DIAGNOSIS — R110 Nausea: Secondary | ICD-10-CM | POA: Diagnosis not present

## 2014-11-01 DIAGNOSIS — R109 Unspecified abdominal pain: Secondary | ICD-10-CM | POA: Diagnosis not present

## 2014-11-02 DIAGNOSIS — E780 Pure hypercholesterolemia: Secondary | ICD-10-CM | POA: Diagnosis not present

## 2014-11-02 DIAGNOSIS — R809 Proteinuria, unspecified: Secondary | ICD-10-CM | POA: Diagnosis not present

## 2014-11-02 DIAGNOSIS — E119 Type 2 diabetes mellitus without complications: Secondary | ICD-10-CM | POA: Diagnosis not present

## 2014-11-03 HISTORY — PX: LIVER BIOPSY: SHX301

## 2014-11-04 ENCOUNTER — Other Ambulatory Visit: Payer: Self-pay | Admitting: Urgent Care

## 2014-11-04 DIAGNOSIS — K7581 Nonalcoholic steatohepatitis (NASH): Secondary | ICD-10-CM | POA: Diagnosis not present

## 2014-11-08 ENCOUNTER — Emergency Department: Payer: Self-pay | Admitting: Emergency Medicine

## 2014-11-08 DIAGNOSIS — I10 Essential (primary) hypertension: Secondary | ICD-10-CM | POA: Diagnosis not present

## 2014-11-08 DIAGNOSIS — Z79899 Other long term (current) drug therapy: Secondary | ICD-10-CM | POA: Diagnosis not present

## 2014-11-08 DIAGNOSIS — R112 Nausea with vomiting, unspecified: Secondary | ICD-10-CM | POA: Diagnosis not present

## 2014-11-08 DIAGNOSIS — E119 Type 2 diabetes mellitus without complications: Secondary | ICD-10-CM | POA: Diagnosis not present

## 2014-11-08 DIAGNOSIS — Z794 Long term (current) use of insulin: Secondary | ICD-10-CM | POA: Diagnosis not present

## 2014-11-10 ENCOUNTER — Ambulatory Visit: Payer: Self-pay | Admitting: Urgent Care

## 2014-11-10 DIAGNOSIS — E559 Vitamin D deficiency, unspecified: Secondary | ICD-10-CM | POA: Diagnosis not present

## 2014-11-10 DIAGNOSIS — R945 Abnormal results of liver function studies: Secondary | ICD-10-CM | POA: Diagnosis not present

## 2014-11-10 DIAGNOSIS — Z841 Family history of disorders of kidney and ureter: Secondary | ICD-10-CM | POA: Diagnosis not present

## 2014-11-10 DIAGNOSIS — G4730 Sleep apnea, unspecified: Secondary | ICD-10-CM | POA: Diagnosis not present

## 2014-11-10 DIAGNOSIS — G2581 Restless legs syndrome: Secondary | ICD-10-CM | POA: Diagnosis not present

## 2014-11-10 DIAGNOSIS — Z9981 Dependence on supplemental oxygen: Secondary | ICD-10-CM | POA: Diagnosis not present

## 2014-11-10 DIAGNOSIS — Z833 Family history of diabetes mellitus: Secondary | ICD-10-CM | POA: Diagnosis not present

## 2014-11-10 DIAGNOSIS — R162 Hepatomegaly with splenomegaly, not elsewhere classified: Secondary | ICD-10-CM | POA: Diagnosis not present

## 2014-11-10 DIAGNOSIS — Z885 Allergy status to narcotic agent status: Secondary | ICD-10-CM | POA: Diagnosis not present

## 2014-11-10 DIAGNOSIS — K7581 Nonalcoholic steatohepatitis (NASH): Secondary | ICD-10-CM | POA: Diagnosis not present

## 2014-11-10 DIAGNOSIS — Z86010 Personal history of colonic polyps: Secondary | ICD-10-CM | POA: Diagnosis not present

## 2014-11-10 DIAGNOSIS — K219 Gastro-esophageal reflux disease without esophagitis: Secondary | ICD-10-CM | POA: Diagnosis not present

## 2014-11-10 DIAGNOSIS — Z794 Long term (current) use of insulin: Secondary | ICD-10-CM | POA: Diagnosis not present

## 2014-11-10 DIAGNOSIS — K7689 Other specified diseases of liver: Secondary | ICD-10-CM | POA: Diagnosis not present

## 2014-11-10 DIAGNOSIS — J449 Chronic obstructive pulmonary disease, unspecified: Secondary | ICD-10-CM | POA: Diagnosis not present

## 2014-11-10 DIAGNOSIS — Z8489 Family history of other specified conditions: Secondary | ICD-10-CM | POA: Diagnosis not present

## 2014-11-10 DIAGNOSIS — F319 Bipolar disorder, unspecified: Secondary | ICD-10-CM | POA: Diagnosis not present

## 2014-11-10 DIAGNOSIS — Z825 Family history of asthma and other chronic lower respiratory diseases: Secondary | ICD-10-CM | POA: Diagnosis not present

## 2014-11-10 DIAGNOSIS — K760 Fatty (change of) liver, not elsewhere classified: Secondary | ICD-10-CM | POA: Diagnosis not present

## 2014-11-10 DIAGNOSIS — E785 Hyperlipidemia, unspecified: Secondary | ICD-10-CM | POA: Diagnosis not present

## 2014-11-10 DIAGNOSIS — E0590 Thyrotoxicosis, unspecified without thyrotoxic crisis or storm: Secondary | ICD-10-CM | POA: Diagnosis not present

## 2014-11-10 DIAGNOSIS — Z8249 Family history of ischemic heart disease and other diseases of the circulatory system: Secondary | ICD-10-CM | POA: Diagnosis not present

## 2014-11-10 DIAGNOSIS — E118 Type 2 diabetes mellitus with unspecified complications: Secondary | ICD-10-CM | POA: Diagnosis not present

## 2014-11-14 ENCOUNTER — Emergency Department: Payer: Self-pay | Admitting: Emergency Medicine

## 2014-11-14 DIAGNOSIS — E119 Type 2 diabetes mellitus without complications: Secondary | ICD-10-CM | POA: Diagnosis not present

## 2014-11-14 DIAGNOSIS — R1031 Right lower quadrant pain: Secondary | ICD-10-CM | POA: Diagnosis not present

## 2014-11-14 DIAGNOSIS — R109 Unspecified abdominal pain: Secondary | ICD-10-CM | POA: Diagnosis not present

## 2014-11-14 DIAGNOSIS — I10 Essential (primary) hypertension: Secondary | ICD-10-CM | POA: Diagnosis not present

## 2014-11-17 DIAGNOSIS — K7581 Nonalcoholic steatohepatitis (NASH): Secondary | ICD-10-CM | POA: Diagnosis not present

## 2014-11-17 DIAGNOSIS — K759 Inflammatory liver disease, unspecified: Secondary | ICD-10-CM | POA: Diagnosis not present

## 2014-11-17 DIAGNOSIS — K5900 Constipation, unspecified: Secondary | ICD-10-CM | POA: Diagnosis not present

## 2014-11-17 DIAGNOSIS — R109 Unspecified abdominal pain: Secondary | ICD-10-CM | POA: Diagnosis not present

## 2014-11-18 DIAGNOSIS — J449 Chronic obstructive pulmonary disease, unspecified: Secondary | ICD-10-CM | POA: Diagnosis not present

## 2014-11-18 DIAGNOSIS — G4733 Obstructive sleep apnea (adult) (pediatric): Secondary | ICD-10-CM | POA: Diagnosis not present

## 2014-11-18 DIAGNOSIS — D649 Anemia, unspecified: Secondary | ICD-10-CM | POA: Diagnosis not present

## 2014-11-18 DIAGNOSIS — R0902 Hypoxemia: Secondary | ICD-10-CM | POA: Diagnosis not present

## 2014-11-18 DIAGNOSIS — R609 Edema, unspecified: Secondary | ICD-10-CM | POA: Diagnosis not present

## 2014-11-19 DIAGNOSIS — K760 Fatty (change of) liver, not elsewhere classified: Secondary | ICD-10-CM | POA: Diagnosis not present

## 2014-11-19 DIAGNOSIS — R4182 Altered mental status, unspecified: Secondary | ICD-10-CM | POA: Diagnosis not present

## 2014-11-19 DIAGNOSIS — R109 Unspecified abdominal pain: Secondary | ICD-10-CM | POA: Diagnosis not present

## 2014-11-19 DIAGNOSIS — F319 Bipolar disorder, unspecified: Secondary | ICD-10-CM | POA: Diagnosis not present

## 2014-11-19 DIAGNOSIS — N858 Other specified noninflammatory disorders of uterus: Secondary | ICD-10-CM | POA: Diagnosis not present

## 2014-11-19 DIAGNOSIS — R410 Disorientation, unspecified: Secondary | ICD-10-CM | POA: Diagnosis not present

## 2014-11-19 DIAGNOSIS — R52 Pain, unspecified: Secondary | ICD-10-CM | POA: Diagnosis not present

## 2014-11-20 ENCOUNTER — Inpatient Hospital Stay: Payer: Self-pay | Admitting: Psychiatry

## 2014-11-20 DIAGNOSIS — K7460 Unspecified cirrhosis of liver: Secondary | ICD-10-CM | POA: Diagnosis not present

## 2014-11-20 DIAGNOSIS — I10 Essential (primary) hypertension: Secondary | ICD-10-CM | POA: Diagnosis not present

## 2014-11-20 DIAGNOSIS — E119 Type 2 diabetes mellitus without complications: Secondary | ICD-10-CM | POA: Diagnosis not present

## 2014-11-20 DIAGNOSIS — E785 Hyperlipidemia, unspecified: Secondary | ICD-10-CM | POA: Diagnosis not present

## 2014-11-21 DIAGNOSIS — K7460 Unspecified cirrhosis of liver: Secondary | ICD-10-CM | POA: Diagnosis not present

## 2014-11-21 DIAGNOSIS — I10 Essential (primary) hypertension: Secondary | ICD-10-CM | POA: Diagnosis not present

## 2014-11-21 DIAGNOSIS — E785 Hyperlipidemia, unspecified: Secondary | ICD-10-CM | POA: Diagnosis not present

## 2014-11-21 DIAGNOSIS — E119 Type 2 diabetes mellitus without complications: Secondary | ICD-10-CM | POA: Diagnosis not present

## 2014-11-22 DIAGNOSIS — K7460 Unspecified cirrhosis of liver: Secondary | ICD-10-CM | POA: Diagnosis not present

## 2014-11-22 DIAGNOSIS — E785 Hyperlipidemia, unspecified: Secondary | ICD-10-CM | POA: Diagnosis not present

## 2014-11-22 DIAGNOSIS — E119 Type 2 diabetes mellitus without complications: Secondary | ICD-10-CM | POA: Diagnosis not present

## 2014-11-22 DIAGNOSIS — I10 Essential (primary) hypertension: Secondary | ICD-10-CM | POA: Diagnosis not present

## 2014-11-23 DIAGNOSIS — K7460 Unspecified cirrhosis of liver: Secondary | ICD-10-CM | POA: Diagnosis not present

## 2014-11-23 DIAGNOSIS — E785 Hyperlipidemia, unspecified: Secondary | ICD-10-CM | POA: Diagnosis not present

## 2014-11-23 DIAGNOSIS — E1165 Type 2 diabetes mellitus with hyperglycemia: Secondary | ICD-10-CM | POA: Diagnosis not present

## 2014-11-23 DIAGNOSIS — E119 Type 2 diabetes mellitus without complications: Secondary | ICD-10-CM | POA: Diagnosis not present

## 2014-11-23 DIAGNOSIS — I10 Essential (primary) hypertension: Secondary | ICD-10-CM | POA: Diagnosis not present

## 2014-11-25 DIAGNOSIS — E119 Type 2 diabetes mellitus without complications: Secondary | ICD-10-CM | POA: Diagnosis not present

## 2014-11-25 DIAGNOSIS — E780 Pure hypercholesterolemia: Secondary | ICD-10-CM | POA: Diagnosis not present

## 2014-11-25 DIAGNOSIS — E1165 Type 2 diabetes mellitus with hyperglycemia: Secondary | ICD-10-CM | POA: Diagnosis not present

## 2014-11-25 DIAGNOSIS — R809 Proteinuria, unspecified: Secondary | ICD-10-CM | POA: Diagnosis not present

## 2014-11-25 DIAGNOSIS — Z79899 Other long term (current) drug therapy: Secondary | ICD-10-CM | POA: Diagnosis not present

## 2014-11-25 DIAGNOSIS — I10 Essential (primary) hypertension: Secondary | ICD-10-CM | POA: Diagnosis not present

## 2014-11-25 DIAGNOSIS — E039 Hypothyroidism, unspecified: Secondary | ICD-10-CM | POA: Diagnosis not present

## 2014-11-25 DIAGNOSIS — G609 Hereditary and idiopathic neuropathy, unspecified: Secondary | ICD-10-CM | POA: Diagnosis not present

## 2014-11-25 IMAGING — CR PELVIS - 1-2 VIEW
1 series · 1 of 1 positions shown · non-contrast
Comparison: none

REASON FOR EXAM: fall, sacral tenderness
COMMENTS:

PROCEDURE:     DXR - DXR PELVIS AP ONLY  - April 23, 2013  [DATE]
RESULT:     There is no evidence of fracture, dislocation, or malalignment.

[ap]
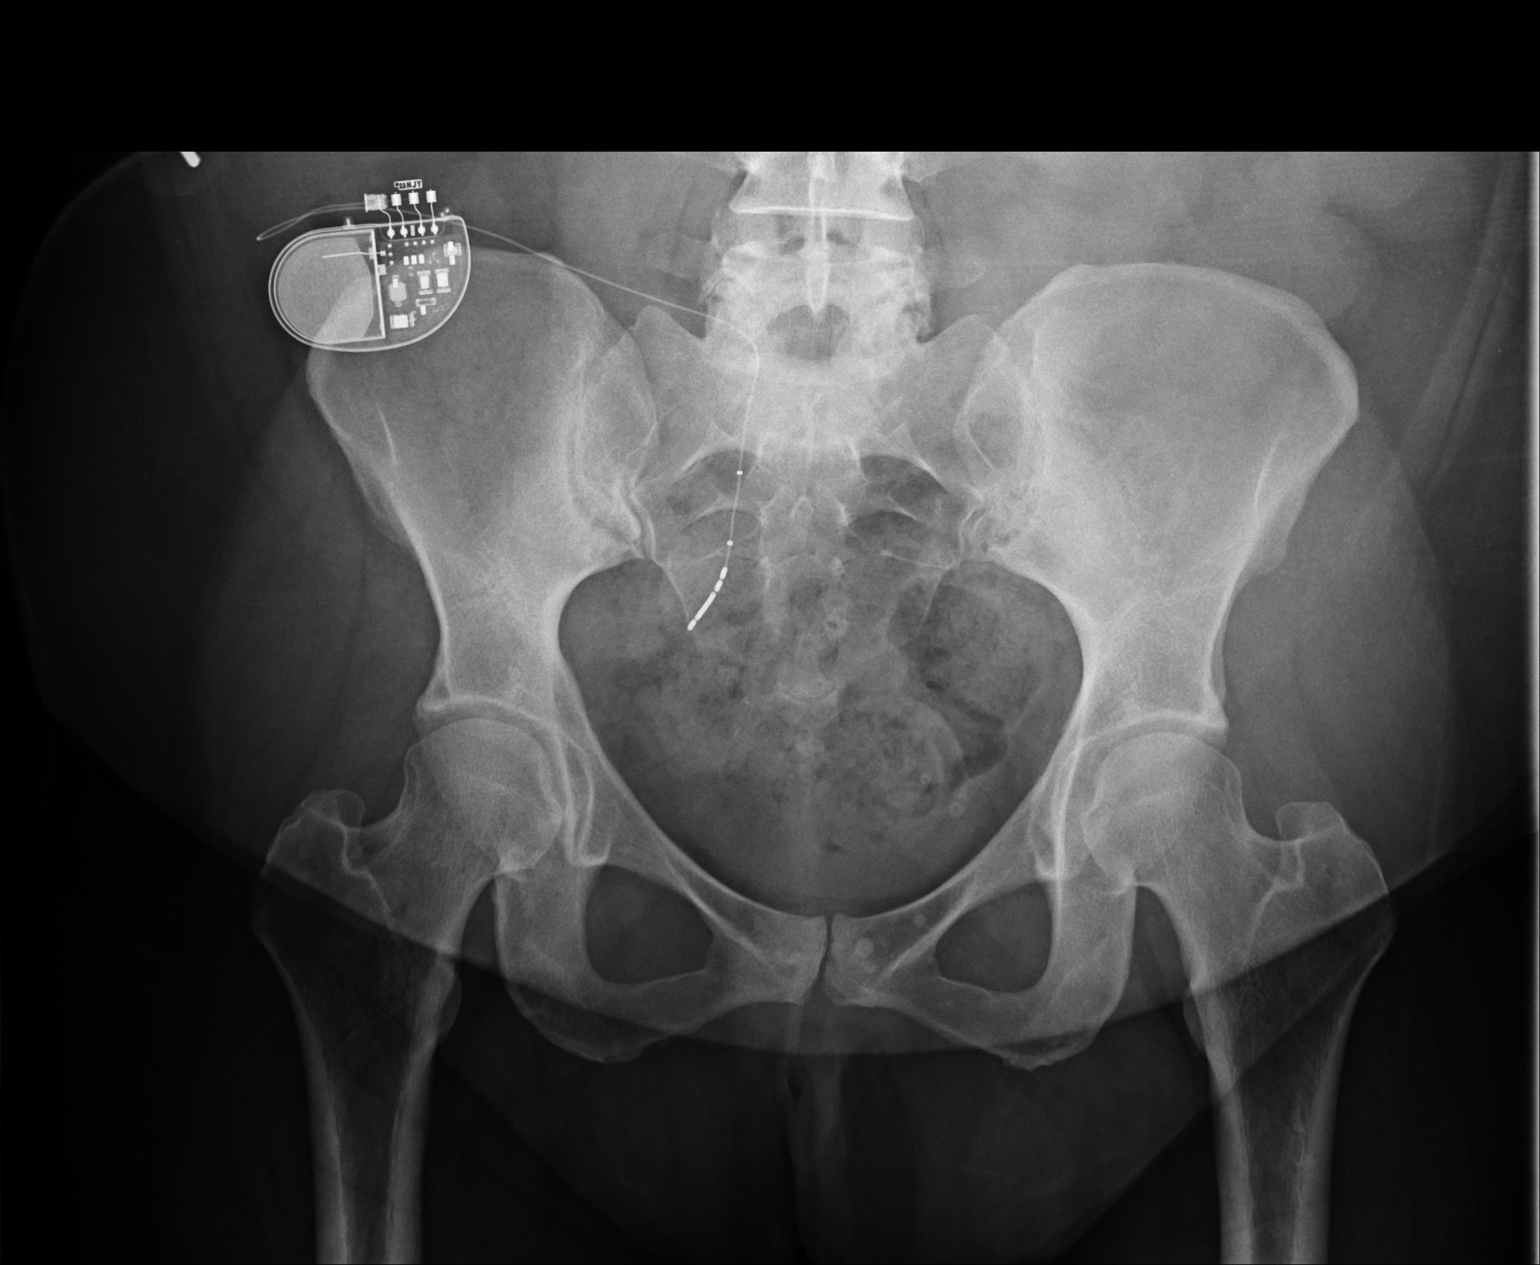

[1 of 1 positions shown; findings below may reference images not displayed]

IMPRESSION: 1. No evidence of acute abnormalities.
2. If there are persistent complaints of pain or persistent clinical
concern, a repeat evaluation in 7-10 days is recommended if clinically
warranted.

## 2014-11-25 IMAGING — CT CT HEAD WITHOUT CONTRAST
1 series · 16 of 28 positions shown, 20 images · non-contrast
Comparison: none

REASON FOR EXAM: fall & dizzy
COMMENTS:

[Series 2: soft tissue · axial · 0.39mm/px · z∈[-53,+72]mm · 16 of 28 slices shown, 20 images]
[im 2/28  brain]
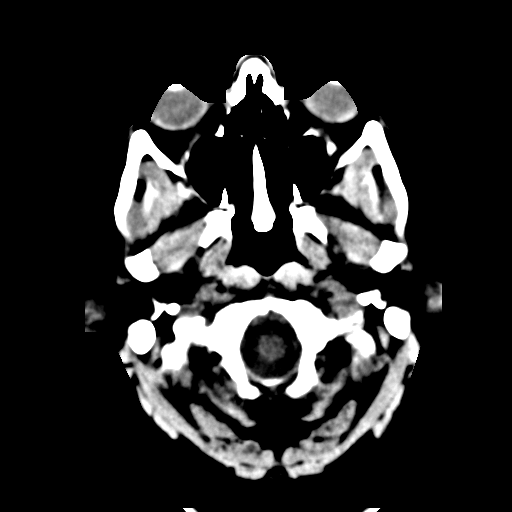
[im 2/28  bone]
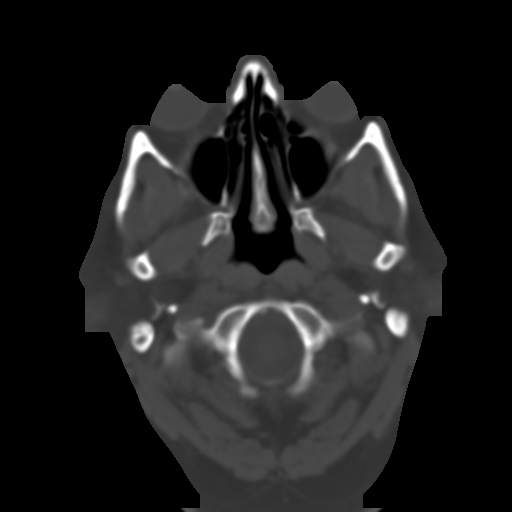
[im 4/28  brain]
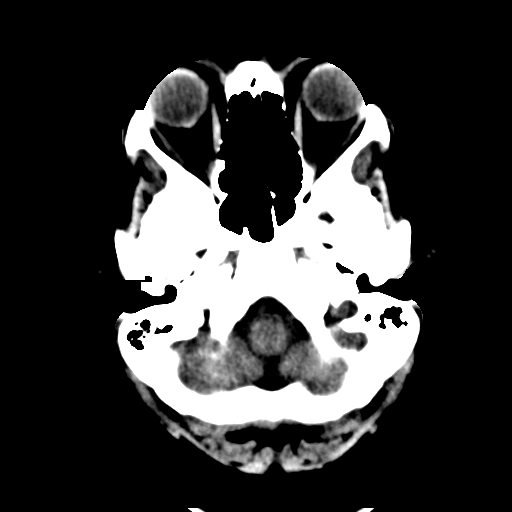
[im 6/28  brain]
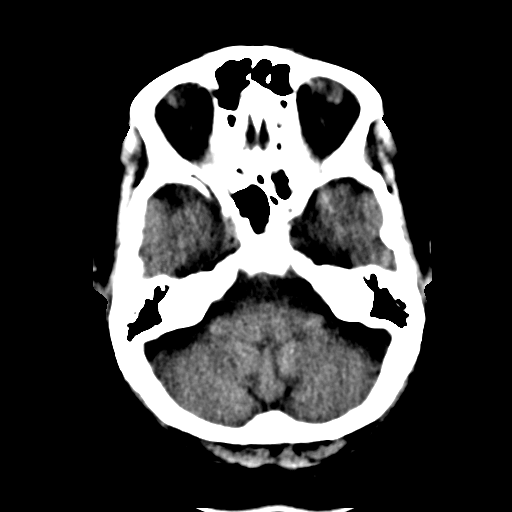
[im 7/28  brain]
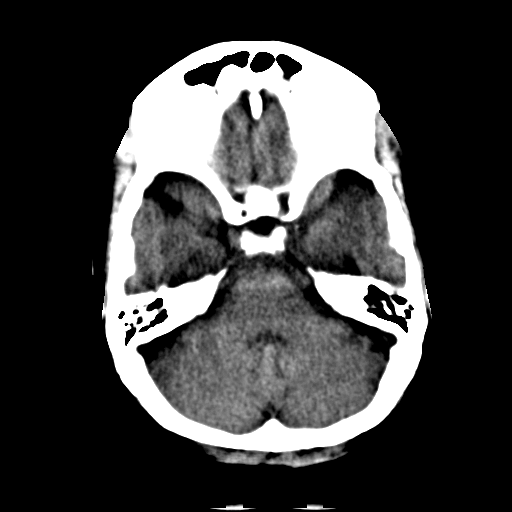
[im 9/28  brain]
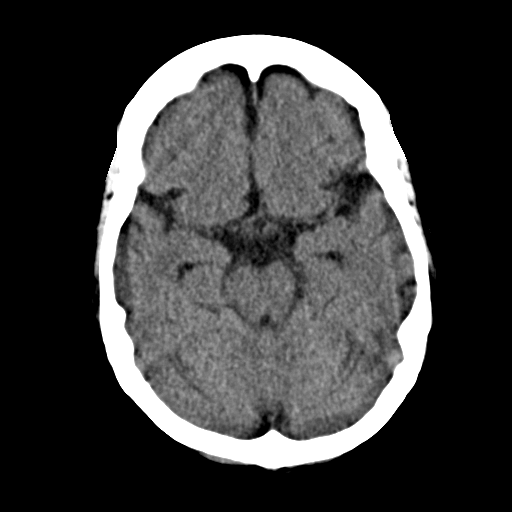
[im 9/28  bone]
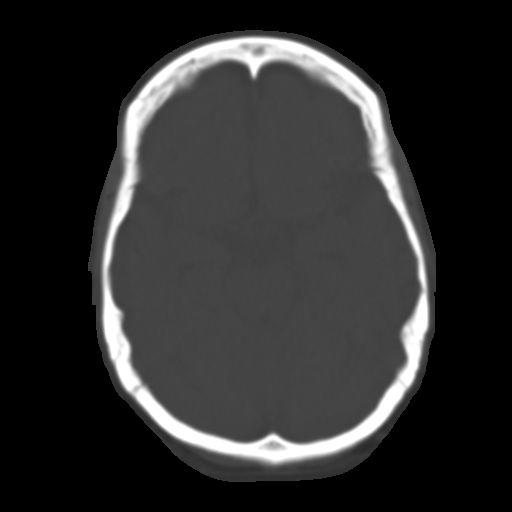
[im 10/28  brain]
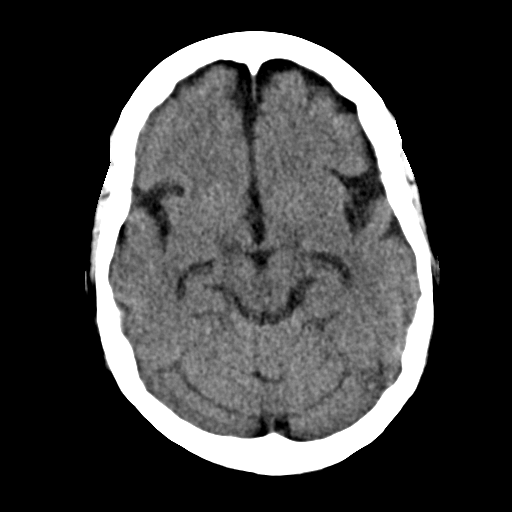
[im 12/28  brain]
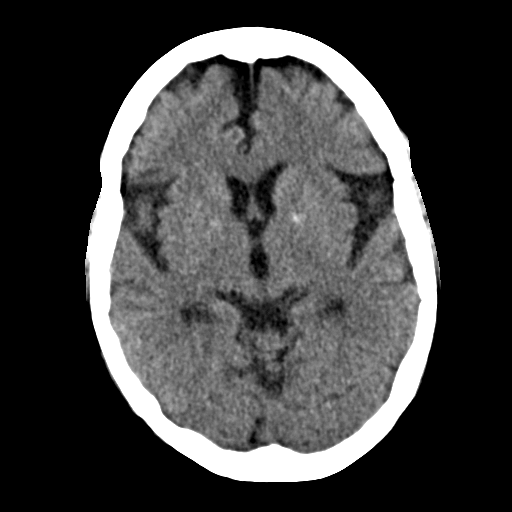
[im 14/28  brain]
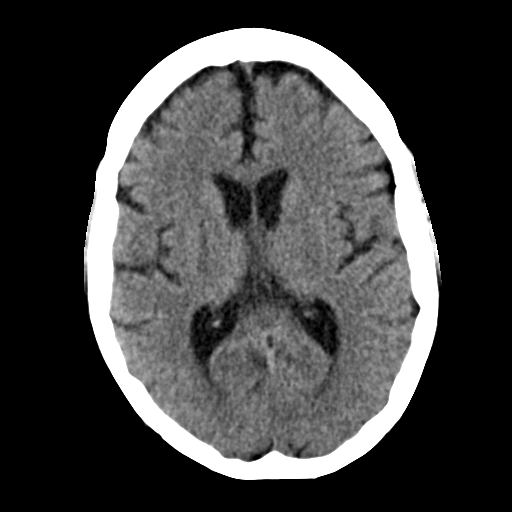
[im 15/28  brain]
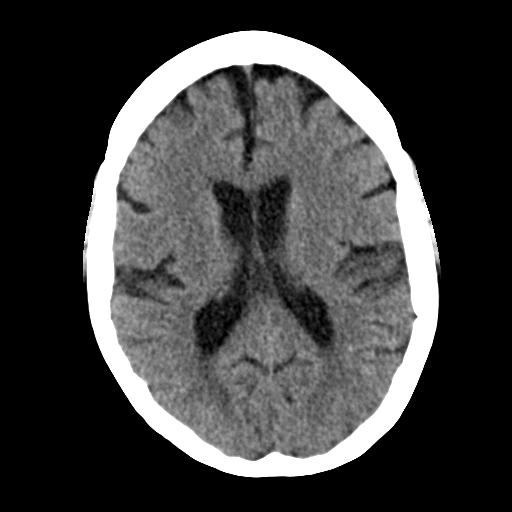
[im 15/28  bone]
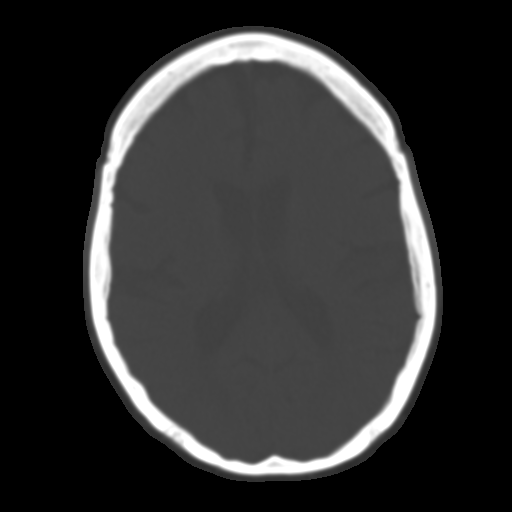
[im 17/28  brain]
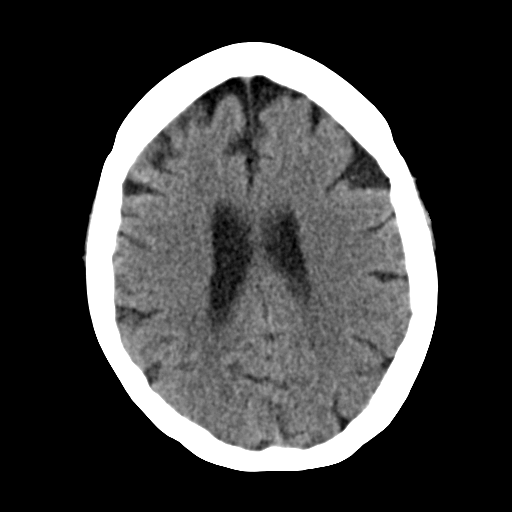
[im 19/28  brain]
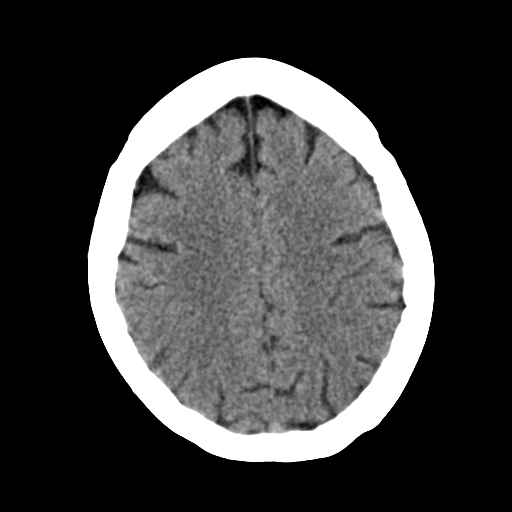
[im 20/28  brain]
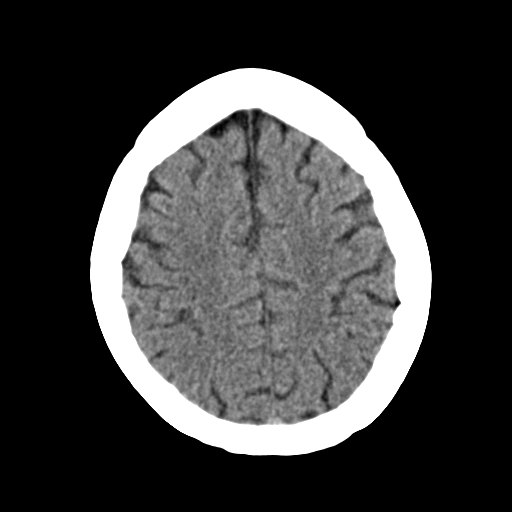
[im 22/28  brain]
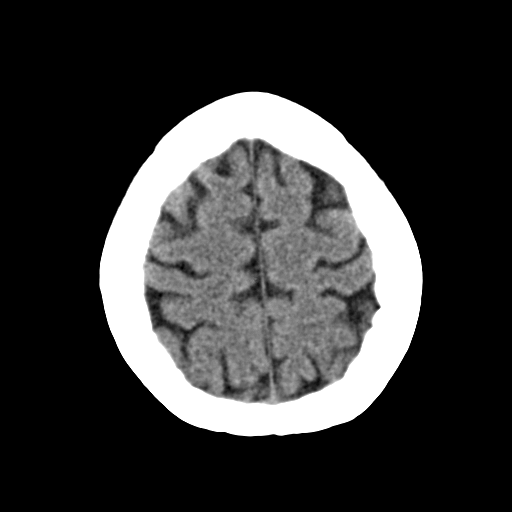
[im 22/28  bone]
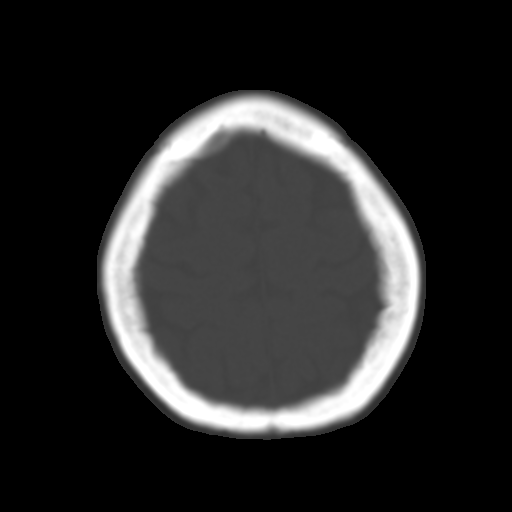
[im 23/28  brain]
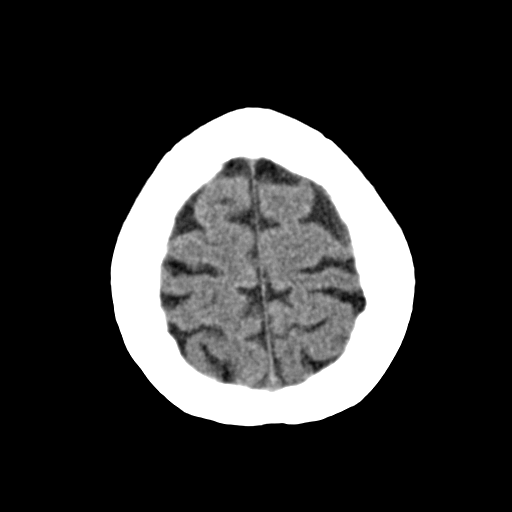
[im 25/28  brain]
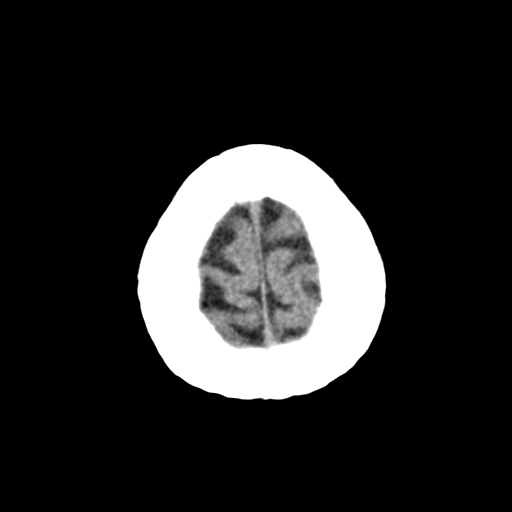
[im 27/28  brain]
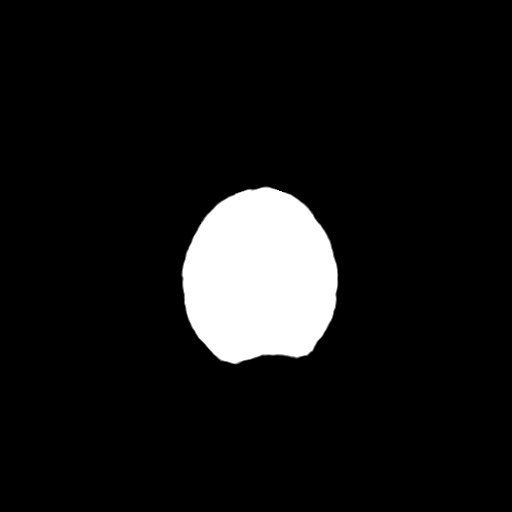

[16 of 28 positions shown; findings below may reference images not displayed]

PROCEDURE:     CT  - CT HEAD WITHOUT CONTRAST  - April 23, 2013  [DATE]

RESULT:     Noncontrast CT of the brain is compared to the study of
01/18/2013.

There is prominence of the ventricles and sulci. Low-attenuation is seen in
the periventricular and subcortical white matter regions. Basal ganglia
calcification is present bilaterally. There is no intracranial hemorrhage,
mass, mass effect or evolving infarct. The appearance is stable area the
sinuses and mastoid air cells show normal appearing aeration. The calvarium
is intact.
IMPRESSION: 1. Changes of atrophy with chronic microvascular ischemic disease. No acute
intracranial abnormality. Stable appearance.

[REDACTED]

## 2014-12-19 DIAGNOSIS — G4733 Obstructive sleep apnea (adult) (pediatric): Secondary | ICD-10-CM | POA: Diagnosis not present

## 2014-12-19 DIAGNOSIS — R0902 Hypoxemia: Secondary | ICD-10-CM | POA: Diagnosis not present

## 2014-12-19 DIAGNOSIS — J449 Chronic obstructive pulmonary disease, unspecified: Secondary | ICD-10-CM | POA: Diagnosis not present

## 2014-12-21 ENCOUNTER — Other Ambulatory Visit
Admit: 2014-12-21 | Disposition: A | Discharge status: Home / Self Care | Payer: Self-pay | Attending: Urgent Care | Admitting: Urgent Care

## 2014-12-21 ENCOUNTER — Other Ambulatory Visit
Admit: 2014-12-21 | Disposition: A | Discharge status: Home / Self Care | Payer: Self-pay | Attending: Family Medicine | Admitting: Family Medicine

## 2014-12-21 DIAGNOSIS — D649 Anemia, unspecified: Secondary | ICD-10-CM | POA: Diagnosis not present

## 2014-12-21 DIAGNOSIS — K759 Inflammatory liver disease, unspecified: Secondary | ICD-10-CM | POA: Diagnosis not present

## 2014-12-21 LAB — CBC WITH DIFFERENTIAL/PLATELET
BASOS ABS: 0 10*3/uL (ref 0.0–0.1)
Basophil %: 0.3 %
EOS ABS: 0.2 10*3/uL (ref 0.0–0.7)
Eosinophil %: 2.1 %
HCT: 34.4 % — AB (ref 35.0–47.0)
HGB: 11.1 g/dL — ABNORMAL LOW (ref 12.0–16.0)
LYMPHS ABS: 2.6 10*3/uL (ref 1.0–3.6)
Lymphocyte %: 32 %
MCH: 28.2 pg (ref 26.0–34.0)
MCHC: 32.4 g/dL (ref 32.0–36.0)
MCV: 87 fL (ref 80–100)
Monocyte #: 0.4 x10 3/mm (ref 0.2–0.9)
Monocyte %: 5 %
Neutrophil #: 4.8 10*3/uL (ref 1.4–6.5)
Neutrophil %: 60.6 %
PLATELETS: 289 10*3/uL (ref 150–440)
RBC: 3.94 10*6/uL (ref 3.80–5.20)
RDW: 14.3 % (ref 11.5–14.5)
WBC: 8 10*3/uL (ref 3.6–11.0)

## 2014-12-21 LAB — FERRITIN: FERRITIN (ARMC): 38 ng/mL

## 2014-12-21 LAB — HEPATIC FUNCTION PANEL A (ARMC)
Albumin: 3.8 g/dL
Alkaline Phosphatase: 87 U/L
Bilirubin,Total: 0.4 mg/dL
SGOT(AST): 51 U/L — ABNORMAL HIGH
SGPT (ALT): 63 U/L — ABNORMAL HIGH
TOTAL PROTEIN: 6.8 g/dL

## 2014-12-21 LAB — FOLATE: Folic Acid: 20.3 ng/mL

## 2014-12-21 LAB — RETICULOCYTES
ABSOLUTE RETIC COUNT: 0.1148 10*6/uL (ref 0.019–0.186)
Reticulocyte: 2.91 % (ref 0.4–3.1)

## 2014-12-22 NOTE — Discharge Summary (Signed)
PATIENT NAME:  [name], [name] MR#:  [id number] DATE OF BIRTH:  1966-02-25  DATE OF ADMISSION:  07/09/2012 DATE OF DISCHARGE:  07/11/2012  HOSPITAL COURSE: Ms. Keeven is a 49 year old woman who was admitted through the Emergency Room after making suicidal threats at home. See the history and physical for details of admission. She has a chronic problem with mood instability and recently has been feeling more depressed. She attributes a lot of this to having arguments and fights with her daughter. Apparently her daughter is her closest support with whom she shares the most emotionally at baseline but she had an argument with her which was very upsetting. Patient made threats to cut her wrists but did not actually act on them. In the hospital she has not shown any sign of psychosis and has not been acting out. We have maintained her medications the same as what she was taking outside the hospital. She slept well last night and tells me today that she spoke with her daughter on the phone and they have resolved their differences. She feels very relieved and feels like her mood is back to baseline. Patient is able to discuss her problems in a reasonable way and shows good insight into the importance of trying to stay more active when she gets depressed. She recognizes that recently when she got depressed she has been isolating herself in her room and that just make things worse. She says she intends to try and stay more active, find things to do, not isolate herself and will follow up with her primary psychiatrist for outpatient treatment. At this point she does not appear to be an acute danger to herself or others. She has long-term risk of suicide based on previous history and ongoing symptoms but that is a chronic condition. Patient is agreeable to the discharge plan.   LABORATORY, DIAGNOSTIC AND RADIOLOGICAL DATA: We did an EKG to check because of her high Seroquel dose. She has a normal sinus rhythm but she does  have a prolonged QT with a QT adjusted interval of 476 ms. That is still below 500 but is slightly longer than average. Her blood sugars have been checked regularly and have maintained in the range around 150 for the most part. Valproic acid level was 57 on admission. Chemistries showed glucose of 101 on admission, albumin very slightly low at 3.2, no significant pathology from that. Alcohol undetectable. TSH 0.45, which is normal. Urinalysis unremarkable, not likely to represent infection. Drug screen positive for tricyclics and benzodiazepines, which is consistent with her medication. CBC is all entirely normal. We did do a lipid panel also because of her history of dyslipidemia and high dose Seroquel. Patient's serum triglyceride was 478. Cholesterol 143, HDL low at 16. Patient was counseled about this and about the risks that Seroquel poses to increasing her lipid levels, particularly triglycerides. She is already on medications appropriate to that. I encouraged her to follow up with her primary care doctor and her psychiatrist. Patient feels very strongly that the Seroquel has been crucial to her mental stability and has no interest in changing it.   DISCHARGE MEDICATIONS:  1. Alprazolam 0.5 mg in the morning and 1 mg at bedtime.  2. Depakote 1000 mg twice a day.  3. Cymbalta 30 mg twice a day.  4. Fenofibrate 145 mg per day.  5. Gabapentin 300 mg twice a day.  6. Lantus insulin 60 units every 12 hours plus sliding scale.  7. Lamictal 125 mg at bedtime.  8. Synthroid 0.088 mg once a day.  9. Metformin 1000 mg twice a day.  10. Quetiapine 900 mg at night.  11. Quinapril 5 mg per day. 12. VESIcare 10 mg per day. 13. Tramadol p.r.n. for recent onset of bursitis pain 50 mg 2 to 3 times a day.   MENTAL STATUS EXAM AT DISCHARGE: Casually dressed, reasonably well groomed woman, looks her stated age. Cooperative and pleasant in the interview. Good eye contact. Normal psychomotor activity. Speech is  normal in rate, tone, and volume. Affect is slightly flattened but not severely so and not depressed. Mood is stated as being "I'm good". She totally denies any suicidal ideation. She is able to discuss how she has over reacted to stress in the past and understands the importance of trying to get some therapy to work on better coping skills. She is able to cite several things in her life, particularly her relationship with her daughter, that are important to her and worth living for. She is able to come up with some ideas about ways to stay more busy. Insight is good. Judgment is improved. Intelligence baseline normal. Good short and long term memory, normal. Alert and oriented x4. No evidence of psychosis. Denies hallucinations. Denies suicidal or homicidal ideation.   DISPOSITION: Patient is to be discharged home.   FOLLOW UP: She has follow up with Dr. Maralyn Sago in [place]. We will try to confirm an appointment before discharge. The only prescription she needed were some tramadol. I am going to give her 20 of the 50 mg strength tramadol with no refill to use p.r.n. for the time being. Otherwise, she has all of her usual medicine at home.   DIAGNOSIS PRINCIPLE AND PRIMARY:  AXIS I: Bipolar disorder, not otherwise specified.   SECONDARY DIAGNOSES:  AXIS I: Alcohol dependence in sustained remission.   AXIS II: Deferred.   AXIS III:  1. Diabetes. 2. Elevated triglycerides. 3. Abnormal lipids. 4. Hypertension. 5. Hypothyroidism. 6. Bursitis.   AXIS IV: Severe. Chronic stress from financial shortcomings.   AXIS V: Functioning at time of discharge 55.  ____________________________ Gonzella Lex, MD jtc:cms D: 07/11/2012 11:37:34 ET T: 07/12/2012 10:40:23 ET JOB#: [id number]  cc: Gonzella Lex, MD, <Dictator> Gonzella Lex MD ELECTRONICALLY SIGNED 07/15/2012 10:48
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
## 2014-12-25 NOTE — H&P (Signed)
PATIENT NAME:  [name], [name] MR#:  [id number] DATE OF BIRTH:  1966-01-04  DATE OF ADMISSION:  01/01/2013  IDENTIFYING INFORMATION: A 49 year old woman with a long history of bipolar disorder admitted to the hospital stating that she feels like her moods are unstable and she needs her medicines changed.   CHIEF COMPLAINT: "I think I need help."   HISTORY OF PRESENT ILLNESS: [name] mood recently has been feeling more unstable. She feels frightened and shaky and anxious a lot of the time. This is despite the fact that her psychiatrist had increased her dose of Xanax to a total of 4 pills per day. She actually says she feels worse since doing that. Additionally, her psychiatrist recently informed her that he would not be able to see her anymore because of a change in her insurance network and this has made her very anxious. The patient has been having more thoughts about wishing she were dead, although she has not acted on it. Does not report that she has been having any psychotic symptoms. She says she has been compliant with her current medications. She recently had an argument with her daughter, which is a typical stress for her. She is not sleeping very well at night. Feels more run down and lethargic and depressed during the day.   PAST PSYCHIATRIC HISTORY: Long history of mental health problems going back many years. Multiple admissions to the hospital. Has a clear history of having had manic symptoms in the past and has been better on mood stabilizers than on antidepressants. She has been on a variety of medicines, although the combination of Seroquel and Depakote and Lamictal seemed to have been helpful recently. She had had good results with lithium in the past, but had had lithium toxicity at relatively low doses as well. The patient does not have a history of violence. She does have a long history of suicidal ideation.   The patient lives with her husband. She has adult children. The  patient does not work, spends most of her time around the house doing not much of anything, minimal social support.   PAST MEDICAL HISTORY: Multiple medical problems including diabetes which requires insulin, COPD, hypothyroidism, elevated cholesterol, hypertension.   CURRENT MEDICATIONS: On admission, the list included ProAir 2 puffs every 4 hours as needed for wheezing, gabapentin 300 mg 3 times a day, levothyroxine 75 mcg once a day, Zantac 150 mg twice a day, Lovaza ester 1000 mg 2 caps twice a day, metformin 1000 mg twice a day, Cymbalta 120 mg a day, Depakote 1000 mg twice a day, fenofibrate 145 mg a day, Lamictal 125 mg a day, quinapril 5 mg a day, Xanax 0.5 mg 4 of them total a day, Lantus 60 units 2 times a day, Seroquel 900 mg at night, VESIcare 10 mg once a day, simvastatin 20 mg once a day, sliding scale.   ALLERGIES: CODEINE, MORPHINE, REMERON AND VICODIN.   REVIEW OF SYSTEMS: Unstable mood, depression, anxiety, suicidal thoughts, poor sleep, agitation, panic attacks (frequent), feeling more tired and run down.   MENTAL STATUS EXAMINATION: A somewhat disheveled woman, looks older than her stated age. Passively cooperative with the interview. Eye contact intermittent. Psychomotor activity slow and sluggish. Speech slow. Affect flat. Mood stated as depressed. Thoughts are generally lucid. No sign of loosening of associations or delusional thinking. Denies auditory or visual hallucinations. Denies suicidal or homicidal ideation at the moment. Adequate judgment and insight. Baseline intelligence normal. Short and long-term memory intact.  PHYSICAL EXAMINATION:  GENERAL: Overweight, sluggish.  NEUROLOGIC AND MUSCULOSKELETAL: Pupils equal and reactive. Face symmetric. Neck and back nontender. Normal gait. Full range of motion at all extremities. Strength and reflexes normal and symmetric throughout. Cranial nerves symmetric and normal.  LUNGS: Clear without wheezes.  HEART: Regular rate and  rhythm.  ABDOMEN: Soft, nontender, normal bowel sounds.  VITAL SIGNS: Temperature 96.7, pulse 91, respirations 18, blood pressure 146/103.   LABORATORY RESULTS: Drug screen positive for benzodiazepines. TSH normal at 1.3. Alcohol undetected. Chemistry panel shows elevated glucose at 199, alkaline phosphatase low at 49, AST elevated at 38. CBC normal. Urinalysis: Probable infection.   ASSESSMENT: This is a 49 year old woman who has been feeling more depressed and anxious and labile. Part of this may be just the stress of having to find a new psychiatrist. She also did not like the idea of taking more Xanax. She has had a lot of hospitalizations in the past and often tends to get better fairly quickly, but needs a lot of help staying stable outside the hospital. Admit to the hospital because of suicidal ideation.   TREATMENT PLAN: I have suggested that we try restarting lithium at a very low dose of just 300 mg at night. She is agreeable to that plan. We will treat the urinary tract infection. I am going to cut the Xanax back to just 0.5 mg twice a day. We will get a Depakote level to see where we are with that. Engage her in daily individual and group psychotherapy.   DIAGNOSIS, PRINCIPAL AND PRIMARY:  AXIS I: Bipolar disorder type II, currently depressed.   SECONDARY DIAGNOSES:  AXIS I: No further.  AXIS II: Deferred.  AXIS III: Diabetes, obesity, hypertension, dyslipidemia hypothyroidism.  AXIS IV: Severe from chronic disability, medical problems, stress of illness.  AXIS V: Functioning at time of evaluation: 56.   ____________________________ Gonzella Lex, MD jtc:jm D: 01/01/2013 18:55:37 ET T: 01/01/2013 19:34:50 ET JOB#: [id number]  cc: Gonzella Lex, MD, <Dictator> Gonzella Lex MD ELECTRONICALLY SIGNED 01/02/2013 16:23

## 2014-12-25 NOTE — Discharge Summary (Signed)
PATIENT NAME:  [name], HEATON MR#:  [id number] DATE OF BIRTH:  1966-06-11  DATE OF ADMISSION:  01/01/2013 DATE OF DISCHARGE:  01/06/2013  HOSPITAL COURSE: See dictated history and physical for details of admission. This is a 49 year old woman with a history of chronic mood instability and diagnosis of bipolar disorder who came to the hospital feeling out of control and feeling like her mood was unstable. She had not been sleeping well and had been feeling more depressed and anxious. The patient was upset particularly because her psychiatrist was no longer able to see her because of a change in insurance coverage and she felt abandoned. In the hospital, the patient had the total dose of her Xanax decreased somewhat. We reviewed her medications including those that had recently been started for her. She was continued on Seroquel 900 mg a day, Cymbalta 120 mg a day, Lamictal  125 mg a day and Depakote 1000 mg twice a day. Tolerated all of these medicines fine. She attended groups and activities appropriately and engaged in appropriate daily therapy. She did not display any dangerous or aggressive behavior on the unit. Her affect remained slightly blunted, but not tearful or agitated. By the time of discharge, the patient reported that her mood was feeling much better. She very much wanted to go home. We have made arrangements for her to be followed up at [facility] with an intake appointment at her discretion this week. The patient is agreeable to the plan.   DISCHARGE MEDICATIONS:  1.  Xanax 0.25 mg b.i.d. p.r.n. for anxiety. The patient was explicitly informed that she could cut down or discontinue this if she felt comfortable with it.  2.  Depakote 1000 mg twice a day. 3.  Cymbalta 120 mg in the morning. 4.  Fenofibrate 145 mg per day. 5.  Gabapentin 300 mg 3 times a day. 6.  Lamotrigine 125 mg at night. 7.  Levothyroxine 75 mcg per day. 8.  Lithium carbonate 300 mg at night. 9.  Macrodantin 50 mg q. 6 hours  for 5 more days. 10.  Fish oil capsule 2 grams twice a day 11.  Seroquel 900 mg at night. 12.  Quetiapine 5 mg per day. 13.  Zantac 150 mg twice a day. 14.  Zocor 20 mg at night. 15.  VESIcare 10 mg per day. 16.  Lantus insulin 60 units subcutaneous twice a day. 17.  Metformin 1000 mg twice a day. 18.  Albuterol inhaler 2 puffs q. 4 hours p.r.n. for shortness of breath.   LABORATORY RESULTS: Admission labs included a drug screen positive for benzodiazepines consistent with her medication. TSH normal at 1.3. Alcohol undetected. Chemistry showed a slightly low alkaline phosphatase at 49 and elevated AST at 38, neither of any significance. Blood sugar was elevated on admission on a nonfasting draw of 199. She has had blood sugars throughout her hospital stay that have mostly ranged in the mid 100s. CBC was normal. Urinalysis indicated a likely urinary tract infection.   MENTAL STATUS EXAMINATION: Casually dressed, adequately groomed woman who looks her stated age, cooperative with the interview. Good eye contact. Normal psychomotor activity. Speech normal in rate, tone and volume. Affect slightly blunted, but not severely so. Mood stated as being anxious about going home. Thoughts appeared to be lucid with no evidence of loosening of associations or delusions. Denies auditory or visual hallucinations. Denies suicidal or homicidal ideation. Shows good insight and judgment. Normal intelligence.   DISPOSITION: The patient is to be discharged  home and will follow up with [facility].   DIAGNOSIS, PRINCIPAL AND PRIMARY:   AXIS I: Bipolar disorder type II, depressed.   SECONDARY DIAGNOSES:  AXIS I: No further.   AXIS II: Borderline traits.   AXIS III: Diabetes, chronic obstructive pulmonary disease, dyslipidemia, gastric reflux symptoms, hypothyroidism, and bladder dysmotility.       AXIS IV:  Moderate chronic from illness and decreased social support and lack of resources.   AXIS V: Functioning  at time of discharge 60.  ____________________________ Gonzella Lex, MD jtc:sb D: 01/06/2013 11:41:54 ET T: 01/06/2013 12:00:30 ET JOB#: [id number]  cc: Gonzella Lex, MD, <Dictator> Gonzella Lex MD ELECTRONICALLY SIGNED 01/07/2013 13:54

## 2014-12-25 NOTE — Consult Note (Signed)
PATIENT NAME:  [name], [name] MR#:  [id number] DATE OF BIRTH:  April 04, 1966  DATE OF CONSULTATION:  04/01/2013  REFERRING PHYSICIAN:  Vipul S. Manuella Ghazi, MD CONSULTING PHYSICIAN:  Doneta Public. Melrose Nakayama, MD  CHIEF COMPLAINT: Altered mental status.   HISTORY OF PRESENT ILLNESS: This 49 year old patient initially presented because of a combination of abrupt onset weakness and confusion. Although the patient was initially suffering from a great deal of confusion, it seems that the symptoms have gotten significantly better over the past 24 hours. The patient was found to have an elevated ammonia level up to 99 and was thought to be suffering from hepatic encephalopathy. She has been started on lactulose to try to bring this level down. Upon admission, the patient was initially seen to be hypoglycemic and also hypoxic down to a level to 88%. Her blood sugars as identified by EMS were as low as 57. Furthermore in the Emergency Department, this patient was diagnosed with a urinary tract infection. I have noted that there was also initially some concern for right-sided weakness. The patient denies any right-sided weakness at the present time. The patient has been a little bit unsteady and requires assistance when walking to the bathroom. Although the patient's level of orientation has improved during her hospitalization, the patient does not consistently follow commands or directions. There is no dysarthria noted. This patient has previously been admitted to the psychiatry service because of bipolar disorder. She has been admitted multiple times to the psychiatric ward for different psychiatric problems. Neurology is asked to evaluate because of her altered mental status.   PAST MEDICAL HISTORY: Bipolar disorder, type 2 diabetes, hypertension, hyperlipidemia, obstructive sleep apnea, diabetic neuropathy, COPD, hypothyroidism.   PAST SURGICAL HISTORY: Cholecystectomy, tubal ligation.   FAMILY HISTORY: Mother with diabetes.    SOCIAL HISTORY: Denies alcohol, drugs or tobacco. She is currently married.   MEDICATIONS: Depakote 1 g twice a day, Lamictal 125 mg at bedtime, Cymbalta, TriCor, Neurontin, levothyroxine, lithium, Macrodantin, fish oil, Zocor, Xanax, Vesicare, Lantus, metformin, albuterol.   ALLERGIES: CODEINE, MORPHINE, DEMEROL, VICODIN.   REVIEW OF SYSTEMS: A 12-point review of systems is personally performed and is negative except for what is mentioned in the history of present illness above.   PHYSICAL EXAM:  VITAL SIGNS: Temperature 98.9 degrees Fahrenheit, blood pressure 152/61, heart rate 96 beats per minute.  GENERAL: The patient is in no acute distress. She is sitting up at the edge of the bed eating dinner when I speak with her this evening.  HEAD: Normocephalic and atraumatic.  EYES: Funduscopic exam is within normal limits. There is no evidence of papilledema.  CARDIOVASCULAR: S1 and S2 heart sounds within normal limits. There are no murmurs, gallops or rubs.  MUSCULOSKELETAL: Muscle bulk and tone are within normal limits.  NEUROLOGIC: Pronator drift is absent bilaterally. I do not note any right upper extremity weakness or pronator drift. AMBULATION: This patient is unsteady when ambulating and requires assistance. STRENGTH: Right upper extremity, left upper extremity, right lower extremity and left lower extremity all 5/5 strength. MENTAL STATUS: The patient is oriented x2. Recent and remote memory are slightly decreased. Attention span and concentration are slightly decreased. Naming, repetition, comprehension and expressive speech are within normal limits. Her fund of knowledge is slightly decreased for her educational level. CRANIAL NERVES: There is normal visual acuity and visual fields. Extraocular muscles are intact. Facial sensation is intact. Facial strength is intact. Hearing is intact. Palate elevation is intact. Shoulder strength is intact. The tongue protrudes  midline. SENSATION: There  is decreased sensation in a length-dependent fashion in her lower extremities up to the level of the mid shins. Otherwise, sensation is intact to all modalities throughout, including to pain, temperature, position and vibration. REFLEXES: Lower extremities 1+ in the patella and Achilles and 2+ in the upper extremities in the biceps and brachioradialis. COORDINATION AND CEREBELLAR TESTING: Finger-to-nose testing is within normal limits.   TESTS: EKG shows normal sinus rhythm, 99 beats per minute. AST and ALT are within normal limits. UA seems positive for urinary tract infection with 3+ leukocyte esterase and white blood cells of 21. Ammonia level is elevated at 99. Blood sugar is within normal limits.   ASSESSMENT: A 49 year old woman with a very complex medical history as outlined in detail above, presents with a combination of abrupt onset weakness, confusion and difficulty walking. On neurological examination, it seems that the majority of her symptoms have significantly improved. I think there are a number of medical explanations for why the patient is experiencing these symptoms. There is no focal deficit noted on neurological examination to suggest an intracranial or other upper motor neuron lesion. At this point, I would recommend continuing to work to normalize her lab abnormalities and other medical abnormalities identified, including her hypoxia, urinary tract infection and elevated ammonia level. If the patient continues to have these problems after these various medical issues have been normalized, then I would consider neuroimaging of the brain at that time; however, I do not appreciate any slurred speech or focal weakness on my exam. This patient should continue taking aspirin daily for prevention.   ____________________________ Doneta Public Melrose Nakayama, MD zep:gb D: 04/02/2013 00:23:20 ET T: 04/02/2013 02:24:34 ET JOB#: [id number]  cc: Doneta Public. Melrose Nakayama, MD, <Dictator> Anabel Bene  MD ELECTRONICALLY SIGNED 04/15/2013 12:14

## 2014-12-25 NOTE — H&P (Signed)
PATIENT NAME:  [name], [name] MR#:  [id number] DATE OF BIRTH:  Aug 10, 1966  DATE OF ADMISSION:  03/31/2013  PRIMARY DOCTOR: used to be Dr.Andrew Lamb>, but she is in the process of getting a new one.   REFERRING EMERGENCY ROOM  PHYSICIAN: Dr. Thomasene Lot.   CHIEF COMPLAINT: Weakness and dizziness.   HISTORY OF PRESENT ILLNESS: The patient is a 49 year old female patient brought in by the family because of weakness, confusion, started this morning. The patient noted to have some slurred speech, confusion, and this morning with some right-sided weakness.   During my visit, the patient alert, awake, oriented. Denied any weakness. The patient also noted to have some slurred speech, according to the husband. No facial droop. The patient also complained of shortness of breath since yesterday, and the patient's O2 saturations were found to be 88% on room air, and right now she is on 2 liters, saturating around 95.   The patient denies any cough or fever. No chest pain. She told me that she always felt she needed oxygen at home.   She also has diabetes, but blood sugar by EMS 57. The patient was given a dose of Glucagon. Repeat blood sugar was 137 upon arrival to the hospital. In the ER, the patient was disoriented when she came with speech difficulty and unable to recite her birthday.  Evaluated by the ER physician, and the patient's labs found to be ammonia 99 with urine showing a WBC of 21 with cloudy urine, so we are asked to admit for hepatic encephalopathy, hypoxia, and UTI.   The patient is awake, oriented during my visit, responds to questions appropriately and still feels  slightly weak on the right side, but her speech is clear, and she is not using accessory muscles of respiration.   PAST MEDICAL HISTORY: Significant for longstanding history of bipolar disorder. Admitted to psych service in April of this year. She also had multiple admissions for psychiatric reasons. She also has a history of  diabetes requiring insulin, hypothyroidism, hyperlipidemia, hypertension, COPD. Diabetic neuropathy and also obstructive sleep apnea.   ALLERGIES: CODEINE, MORPHINE, DEMEROL, AND VICODIN.   MEDICATIONS:  1.  Depakote 1 g p.o. b.i.d. 2.  Cymbalta 120 mg in the morning.  3.  TriCor 145 mg in the evening.  4.  Neurontin 300 mg p.o. t.i.d. 5.  Lamictal 125 mg at night.  6.  Levothyroxine 75 mcg per day.  7.  Lithium carbonate 300 mg at night.  8.  She got Macrodantin when she was discharged in May  9.  She is also on fish oil 2 g twice daily.  10..   11.  Zocor 20 mg at night.  12.  Xanax 0.25 mg p.o. b.i.d. p.r.n.  13.  VESIcare 10 mg per day. 14.  Lantus 60 units twice daily subQ.  15.  Metformin 1 g twice daily.  16.  Albuterol as needed.   SOCIAL HISTORY: No smoking. No drinking. No drugs. Lives with her husband.   PAST SURGICAL HISTORY: History of gallbladder removal, also tubal ligation. FAMILY HISTORY: Mother has diabetes and she is on hemodialysis.   REVIEW OF SYSTEMS:  GENERAL: No fever. Does have some fatigue and weakness.  EYES: No blurred vision.  ENT: No tinnitus. No ear pain. No hearing loss. No epistaxis. No difficulty swallowing.  RESPIRATORY: No cough, but complains of shortness of breath.  CARDIOVASCULAR: No chest pain. No orthopnea. No pedal edema.  GASTROINTESTINAL: No nausea. No vomiting. No abdominal pain.  GENITOURINARY: The patient complains of some dysuria, suprapubic pain.  ENDOCRINE: No polyuria or nocturia.  HEMATOLOGIC: No anemia.  INTEGUMENTARY: No skin rashes.  MUSCULOSKELETAL: No joint pain.  NEUROLOGIC: Complains of weakness on the right side along with some slurred speech this morning.  PSYCHIATRIC: No anxiety, but does have a history of bipolar disorder.   PHYSICAL EXAMINATION:  VITALS: Temperature is 98.9. Heart rate 96. Blood pressure 152/61. O2 saturations 88% on room air, 94% on 2 liters of oxygen.  GENERAL: Well-developed, well-nourished,  obese female, not in distress.  HEENT: Head atraumatic, normocephalic.  EYES: PERRLA. EOMI. No conjunctival pallor.  NOSE: No nasal lesions.  EARS: No drainage, no external lesions.  MOUTH: No lesions.  NECK: Supple. No JVD. Normal range of motion. Thyroid is in the midline.  RESPIRATORY: Clear to auscultation. Good respiratory effort. No wheezing.  CARDIOVASCULAR: S1, S2 regular. No murmurs. Femoral and pedal pulses are present.  GASTROINTESTINAL: The patient has slight suprapubic tenderness present. No hernias.  ABDOMEN: Soft, nondistended. No CVA tenderness.  MUSCULOSKELETAL: Strength 5/5 in upper and lower extremities.  SKIN: Warm and dry.  LYMPH NODES: No cervical lymphadenopathy.  NEUROLOGIC: Cranial nerves II through XII intact,  4/5 in the upper and lower extremities. Sensations are intact are intact. DTR 2+ bilaterally.  PSYCHIATRIC: Judgment and insight are intact. The patient feels her mood is good at this time.   LABORATORY, DIAGNOSTIC AND RADIOLOGICAL DATA: UA looks cloudy with leukocyte esterase 3+ and WBC 21. TSH levels are still pending.   Electrolytes: Sodium 137, potassium 4.3, chloride 103, bicarbonate 30, BUN 19, creatinine 1.11, glucose 98. LFTs: AST is 39, ALT 37. Alkaline phosphatase 38. Total bilirubin 0.3. Albumin is 3. [name] ammonia level is 99.   ABG with pH 7.40, pCO2 of 44, pO2 of 87, 97.6 saturation, and that is done on nasal cannula.   Blood glucose 138. [name] troponin less than 0.02.   Lithium level 0.54.   EKG showed normal sinus at 99 beats per minute. No ST-T changes.   ASSESSMENT AND PLAN: This is a 49 year old female patient with altered mental status, with weakness and confusion, likely secondary to hepatic encephalopathy and also related to her multiple psychiatric medications.  1.  Hepatic encephalopathy. She is on lactulose. The reason is unclear, likely probably secondary to medicines. Her LFTs are normal, so continue lactulose.  Check ammonia levels.  2.  Suspect urinary tract infection. The patient does have symptoms, but urinalysis has no bacteriuria. We will continue Rocephin and follow urine cultures.  3.  Hypoglycemia, resolved with Glucagon. The patient will be on metformin at sliding scale and hold Lantus, follow the blood sugar levels and resume Lantus at a lower dose.  4.  Hypoxia with a history of chronic obstructive pulmonary disease. Has no wheezing. Check the oxygen saturations on ambulation and also at rest to see if she qualifies for home O2.  5.  Hyperlipidemia. Continue statins.  6.  Possible transient ischemic attack with weakness, confusion, and slurred speech, so I am getting an MRI of the brain. Follow the neuro checks and also follow the echocardiogram.  7.  The patient has diagnoses including bipolar disorder. She is on multiple psychiatric medications. She was here recently in April and discharged in May. At that time, she was on Seroquel, Depakote, lithium, Neurontin at higher doses, so continue Depakote at 1 g twice daily, Seroquel 900 mg at night, Lamictal 125 mg at night and Cymbalta 30 mg twice daily and  continue lithium at lower dose, 300 mg daily.   The patient says she feels good and does not need to be seen by psychiatrist at this time; so, we are going to keep her on the medicine service and see how she does and probably discharge her in 1 to 2 days.   TIME SPENT ON ADMISSION: About 55 minutes.    ____________________________ Epifanio Lesches, MD sk:np D: 03/31/2013 16:20:00 ET T: 03/31/2013 17:28:44 ET JOB#: [id number]  cc: Epifanio Lesches, MD, <Dictator>  Epifanio Lesches MD ELECTRONICALLY SIGNED 04/22/2013 14:58

## 2014-12-25 NOTE — Discharge Summary (Signed)
PATIENT NAME:  JODEE, WAGENAAR MR#:  [id number] DATE OF BIRTH:  [birth year]  DATE OF ADMISSION:  03/31/2013 DATE OF DISCHARGE:  04/03/2013  DISCHARGE DIAGNOSES: 1.  Metabolic encephalopathy, likely due to overuse of medications including narcotics, benzodiazepines and some other psychiatry medications also in very high doses, has been much more awake and alert since cutting back on several of those medications.  2.  Urinary tract infection, treated.  Urine culture contaminated.  3.  Hypoglycemia, now resolved, status post glucagon.  Can resume home medications as she is eating well.  4.  Hypoxia with history of chronic obstructive pulmonary disease, transient and resolved.  No exacerbation.  No transient ischemic attack or neurological issues.  Her echocardiogram was normal.  5.  Bipolar disorder.  Can resume her psychiatric medication and follow up with [facility] and can take medications per their recommendation.   SECONDARY DIAGNOSES: 1.  History of bipolar disorder.  2.  Diabetes.  3.  Hypothyroidism.  4.  Hypertension.  5.  Hyperlipidemia.  6.  Chronic obstructive pulmonary disease.  7.  Diabetic neuropathy.  8.  Obstructive sleep apnea.   CONSULTATIONS:  Psychiatry, Dr. Chauncy Passy.  Neurology, Dr. Melrose Nakayama.   PROCEDURES AND RADIOLOGY:  A 2-D echocardiogram on the 29th of July showed normal LV systolic function with EF of 60% to 65%.   MAJOR LABORATORY PANEL:   1.  UA on admission was showing 21 WBCs, 3+ leukocyte esterase, otherwise negative.  2.  Urine culture was contaminated.   HISTORY AND SHORT HOSPITAL COURSE:  The patient is a 49 year old female with the above-mentioned medical problems, was admitted for metabolic encephalopathy thought to be secondary to hypoglycemia and overuse of several of her medications including narcotics, benzodiazepines and higher doses of antipsychotics.  She also had elevated LFTs for which she was started on lactulose and her ammonia had normalized.  Please see  Dr. Governor Specking dictated history and physical for further details.  Neurology consultation was obtained with Dr. Melrose Nakayama who agreed with the same and recommended cutting back on her benzodiazepine and narcotics along with her psychiatry medication.  Dr. Chauncy Passy from psychiatry also evaluated the patient and he was in agreement for the same as patient was lethargic for almost 48 hours and was barely opening her eyes.  Since stopping benzodiazepine, narcotics and cutting back on Seroquel, the patient's started waking up.  On the 31st of July she was wide awake and alert and wanted to go home as she was not too happy about cutting back on her medication including Seroquel.  At this point she is stable enough to be discharged and is being discharged home in stable condition.   PHYSICAL EXAMINATION: VITAL SIGNS:  On the date of discharge, her vital signs were as follows:  Temperature 98.1, heart rate 60 per minute, respirations 18 per minute, blood pressure 121/69 mmHg.  She was saturating 92% to 94% on room air.  Pertinent physical examination on the date of discharge:  CARDIOVASCULAR:  S1, S2 normal.  No murmurs, rubs or gallop.  LUNGS:  Clear to auscultation bilaterally.  No wheezing, rales, rhonchi or crepitation.  ABDOMEN:  Soft, benign.  NEUROLOGIC:  Nonfocal examination.  All other physical examination remained at baseline.   DISCHARGE MEDICATIONS: 1.  Quinapril 5 mg by mouth daily.  2.  Gabapentin 300 mg by mouth 3 times a day.  3.  Lamotrigine 25 mg 5 tablets by mouth at bedtime.  4.  Divalproex sodium 500 mg 2 tablets by mouth  twice daily.  5.  Duloxetine 60 mg 2 capsules by mouth daily.  6.  Metformin 1000 mg by mouth twice daily.  7.  Insulin Lantus 60 units subQ twice daily.  8.  Simvastatin 20 mg by mouth at bedtime.  9.  Fenofibrate 145 mg by mouth daily.  10.  Lithium 300 mg by mouth at bedtime.  11.  Seroquel 900 mg by mouth at bedtime.  12.  Albuterol 2 puffs inhaled every 4 to 6  hours as needed.  13.  Ranitidine 150 mg by mouth twice daily.  14.  Omega-3 fatty acid 2 grams by mouth twice a day.  15.  Levothyroxine 75 mcg by mouth daily.  16.  Solifenacin 10 mg by mouth daily.   DISCHARGE DIET:  Low sodium, 1800 ADA.   DISCHARGE ACTIVITY:  As tolerated.   DISCHARGE INSTRUCTIONS AND FOLLOW-UP:   1.  The patient was instructed to follow up with her primary care physician at main [facility] here at [facility] in 1 to 2 weeks as she was following with Dr. Heber [place] who no longer practices starting tomorrow.  2.  The patient will need follow-up with her psychiatry at [facility] tomorrow as scheduled.   Total time discharging this patient was 55 minutes.     ____________________________ Lucina Mellow. Manuella Ghazi, MD vss:ea D: 04/03/2013 15:48:40 ET T: 04/03/2013 19:42:59 ET JOB#: [id number]  cc: Katia Hannen S. Manuella Ghazi, MD, <Dictator> Idalia Needle. Chauncy Passy, MD Rachelle Hora. Heber Chewey, MD [place] Melrose Nakayama, MD Remer Macho MD ELECTRONICALLY SIGNED 04/04/2013 12:15

## 2014-12-25 NOTE — Consult Note (Signed)
PATIENT NAME:  [name], [name] MR#:  [id number] DATE OF BIRTH:  12/20/1965  DATE OF CONSULTATION:  04/02/2013  REFERRING PHYSICIAN:  Vipul S. Manuella Ghazi, MD CONSULTING PHYSICIAN:  Idalia Needle. Chauncy Passy, MD  REASON FOR CONSULTATION: Bipolar; lethargic, medication adjustment.   HISTORY OF PRESENT ILLNESS: [name] is a patient well known to psychiatry. She has a diagnosis of bipolar disorder. She has been seen in the ED and has been hospitalized at [facility] on the psychiatric unit.   She presented for this hospitalization brought in by her family for symptoms of abrupt-onset weakness and confusion.   She was admitted with an ammonia level up to 99, which has decreased on July 29 to 49. She was diagnosed with a hepatic encephalopathy and was started on lactulose.   She was also hypoglycemic and hypoxic when she was admitted.   She has also been diagnosed with a urinary tract infection.   There was also concern about her orientation and her not following directions and being unsteady on ambulation.   PAST MEDICAL HISTORY:  1. Bipolar disorder. 2. Type 2 diabetes.  3. Hypertension. 4. Hyperlipidemia. 5. Obstructive sleep apnea.  6. Diabetic neuropathy. 7. COPD. 8. Hypothyroidism.   PAST SURGICAL HISTORY: Cholecystectomy and bilateral tubal ligation,   FAMILY HISTORY: There is no mental illness, per her report, in the family. She does report, however, her mother had diabetes.   SOCIAL HISTORY: She is currently married, and her daughter recently moved in with her. This is a source of stress for [name], but she says, "I can't put her out on the street."   DRUG AND ALCOHOL ABUSE HISTORY: None.   PSYCHIATRIC MEDICATIONS:  1. Depakote 1000 mg b.i.d. 2. Lamictal 125 mg q.h.s.  3. Cymbalta 120 mg q.a.m.  4. Lamotrigine 125 mg q.h.s.   5. Gabapentin 300 mg p.o. t.i.d.  She was initially also admitted on Seroquel 900 mg p.o. q.h.s. This has been discontinued.   LABORATORY RESULTS:   1. On admission, 03/31/2013, ammonia level 99, and on 04/01/2013, ammonia level was 49.  2. Lithium on 03/31/2013 level is 0.54.   NONPSYCHIATRIC MEDICATIONS:  1. TriCor 145 mg in the evening. 2. Levothyroxine 75 mcg daily. 3. Zocor 20 mg p.o. q.h.s.  4. Metformin 1000 mg p.o. b.i.d. 5. Lantus 60 units subcutaneous b.i.d. 6. Albuterol inhaler as needed.  ASSESSMENT: [name] is a 49 year old woman who looks much older than her stated age. She has numerous medical problems and is on numerous medications for those.  She came to the ED with an abrupt onset of weakness, unsteady gait, confusion and some complaints of right-sided weakness.  On my interview with [name], initially she appeared to be confused, and when I asked her how she was she said "6 to 7." I asked her what that meant, and she could not explain. I asked her if she meant 6 to 7 out of 10, and she nodded in the affirmative. I asked her her name, and she was able to tell me. I asked her where she was, and she could not tell me. She knew she was in [place], [place], but did not know that she was in a hospital. I asked her for a home address, and she started mumbling. She then said, "I can't talk, I don't know. She appeared to be awake and able to understand my questions at that time.  I started talking with her about her medications, and she suddenly started snoring, closed  her eyes. It was very difficult to awaken her, and when I did, she appeared confused and could not answer my questions.   RECOMMENDATIONS:  1. After review of [name]'s medications, she does not have any history of psychosis. Thus,  stopping the Seroquel as has been done should not interfere with [name]'s psychiatric functioning. Also, stopping her benzodiazepines should also be helpful to her as she has been advised against taking those in the past.  2. Continue treatment to normalize [name]'s medical picture.  3.  We would behappy to see  [name] again, if needed, when she has recompensated as she was lethargic on my interview.    ____________________________ Idalia Needle Chauncy Passy, MD fcg:OSi D: 04/02/2013 11:26:22 ET T: 04/02/2013 11:44:26 ET JOB#: [id number]  cc: Idalia Needle. Chauncy Passy, MD, <Dictator> Ladoris Gene MD ELECTRONICALLY SIGNED 04/02/2013 13:04

## 2014-12-26 NOTE — H&P (Signed)
PATIENT NAME:  [name], ROTENBERRY MR#:  [id number] DATE OF BIRTH:  [birth year]  DATE OF ADMISSION:  09/21/2013  CHIEF COMPLAINT:  Cough, shortness of breath, wheezing and high blood sugar.   HISTORY OF PRESENT ILLNESS:  This is a 49 year old Caucasian female with a history of COPD, diabetes, obstructive sleep apnea presenting in the ED with the above chief complaint.  The patient is alert, awake, oriented, in no acute distress. The patient said she has had a cough, shortness of breath and wheezing for the last 2 days. In addition, the patient's blood sugar has been high, above 500, at home several days. The patient could not breathe properly so she came to the ED for further evaluation. [name] O2 saturation was noticed decreased to 80s even after treatment with nebulizer 2 doses. The patient denies any fever but has chills, headache, weakness. The patient has polyuria, polydipsia but denies any other symptoms.   PAST MEDICAL HISTORY: Bipolar disorder and multiple admissions for psychiatric reasons, diabetes, hypothyroidism, hyperlipidemia, hypertension, COPD, diabetic neuropathy, obstructive sleep apnea, oxygen 2 liters at home at night.   FAMILY HISTORY: Mother has diabetes and also renal disease on dialysis day. Father died of suicide.     SOCIAL HISTORY: The patient denies any smoking, alcohol use or illicit drugs.   ALLERGIES: CODEINE, MORPHINE, REMERON, VICODIN.   HOME MEDICATIONS:  1.  Vitamin D2 50,000 international units 1 cap once a week.  2.  Vitamins B12 1 tablet once a day.  3.  Victoza 18 mg p.o. daily.  4.  Ventolin HFA CFC-free 90 mcg 2 puffs 4 times p.r.n.  5.  Tramadol 50 mg p.o. every 8 hours p.r.n.  6.  Ranitidine 150 mg p.o. q.12 hours.  7.  Quinapril 5 mg p.o. daily.  8.  Quetiapine 300 mg 3 tablets once a day at bedtime.  9.  Metformin 1000 mg p.o. b.i.d.  10.  Magnesium oxide 400 mg p.o. b.i.d.  11.  Levothyroxine 75 mcg p.o. daily.  12.  Lamotrigine 25 mg p.o. at  bedtime with 100 mg.  13.  Humalog sliding scale.  14.  Lantus 70 units b.i.d.  15.  Gabapentin 300 mg t.i.d.  16.  Fenofibrate acid 135 mg p.o. daily.  17.  Duloxetine 60 mg p.o. 2 caps once a day.  18.  Divalproex sodium 500 mg p.o. 2 tablets twice a day.  19.  Crestor 10 mg p.o. at bedtime.  20.  Alprazolam 0.25 mg p.o. b.i.d.   REVIEW OF SYSTEMS: CONSTITUTIONAL: The patient denies any fever but had chills, weakness, headache.  EYES: No double vision or blurry vision.  ENT: No postnasal drip, slurred speech or dysphagia.  CARDIOVASCULAR: No chest pain, palpitation, orthopnea or nocturnal dyspnea. No leg edema.  PULMONARY: Positive for cough, sputum, shortness of breath, wheezing but no hemoptysis.  GASTROINTESTINAL: No abdominal pain, nausea, vomiting, diarrhea. No melena or bloody stool.  GENITOURINARY: No dysuria, hematuria or incontinence.  SKIN: No rash or jaundice.  NEUROLOGY: No syncope, loss of consciousness or seizure.   HEMATOLOGY: No easy bruising or bleeding.  ENDOCRINE: Positive for polyuria, polydipsia, but no heat or cold intolerance.   PHYSICAL EXAMINATION: VITAL SIGNS: Temperature 98.2, blood pressure 133/63, pulse 117, respirations 18, O2  saturation 96% on oxygen.  GENERAL: The patient is alert, awake, oriented, in no acute distress.  HEENT: Pupils round, equal and reactive to light and accommodation. Moist oral mucosa. Clear oropharynx.  NECK: Supple. No JVD or carotid bruits. No  lymphadenopathy. No thyromegaly.  CARDIOVASCULAR: S1, S2, tachycardia. No murmurs, rubs or gallops.  PULMONARY: Bilateral limited air entry and very weak breath sounds with wheezing but no crackles. No use of accessory muscle to breathe.  ABDOMEN: Obese, soft. Bowel sounds present. No distention or tenderness. No organomegaly.  EXTREMITIES: No edema, clubbing or cyanosis. No calf tenderness. pulses present bilaterally.   SKIN: No rash or jaundice.  NEUROLOGIC: A and O x 3. No focal  deficit. Power 5 out of 5.  Sensation is intact.    LABORATORY, DIAGNOSTIC AND RADIOLOGICAL DATA:  Chest x-ray showed no acute finding. BNP 40. WBC 6.7, hemoglobin 10.8, platelets 252. Glucose 413, BUN 25, creatinine 1.17, sodium 133, potassium 5.0, chloride 97, bicarb 27. Troponin less than 0.02. TSH 1.35. Urinalysis is negative.   IMPRESSIONS: 1.  Chronic obstructive pulmonary disease exacerbation.  2.  Hyperglycemia and uncontrolled diabetes.  3.  Dehydration.  4.  Anemia.  5.  Obesity.  6.  Obstructive sleep apnea.   PLAN OF TREATMENT: 1.  The patient will be admitted to the medical floor. We will continue O2 by nasal cannula, start Solu-Medrol IV and give Xopenex q.4 hours around the clock.  2.  We will start normal saline for dehydration and follow up BMP.  3.  for uncontrolled diabetes and hyperglycemia, we will start sliding scale and Levemir. Check hemoglobin A1c.   4.  Continue other home medications.   I discussed the patient's condition and plan of treatment with the patient and the patient wants full code.   TIME SPENT: About 53 minutes.     ____________________________ Demetrios Loll, MD qc:cs D: 09/21/2013 15:33:09 ET T: 09/21/2013 18:15:45 ET JOB#: [id number]  cc: Demetrios Loll, MD, <Dictator> Demetrios Loll MD ELECTRONICALLY SIGNED 09/22/2013 17:58

## 2014-12-26 NOTE — Discharge Summary (Signed)
PATIENT NAME:  [name], [name] MR#:  [id number] DATE OF BIRTH:  03/12/1966  DATE OF ADMISSION:  05/13/2014 DATE OF DISCHARGE:  05/14/2014  ADMISSION DIAGNOSES:  1.  Dizziness.  2.  Acute renal failure.   DISCHARGE DIAGNOSES:  1.  Dizziness, positional in nature.  2.  Bipolar affective disorder.  3.  History of diabetes.  4.  Acute renal failure, resolved.    CONSULTATIONS: None.   LABORATORIES: White blood cells 5, hemoglobin 9, hematocrit 29, platelets 198,000. Sodium 140, potassium 5.0, chloride 110, bicarbonate 25, BUN 36, creatinine 1.54, glucose 104.    HOSPITAL COURSE: This is a 49 year old female with bipolar affective disorder who was feeling dizzy and had a mechanical fall. She subsequently came into the ER for further evaluation and was noted to have acute renal failure. For further details, please refer to the H 1.    Dizziness, sounds like positional in nature. Her Depakote level was normal on admission. This dizziness has resolved. I did explain to her that since it occurs in different positions, she needs to be cautions of these positions and take time in her movement.  2.  Acute renal failure, which has resolved with IV fluids.  3.  Bipolar affective disorder. The patient will continue outpatient medications.  4.  Diabetes. Her blood sugars are actually low normal, so I decreased her insulin dose.  5.  Chronic obstructive pulmonary disease with chronic respiratory failure on oxygen at home. No issues at this time.     DISCHARGE MEDICATIONS:  1.  Duloxetine 60 mg 2 tablets daily.  2.  Fenofibric acid 135 mg daily.  3.  Lamotrigine 125 mg at bedtime, along with 100 mg of lamotrigine daily.  4.  Synthroid 75 mcg daily.  5.  Xanax 0.25 mg b.i.d.  6.  Depakote 500 mg 2 tablets b.i.d.  7.  Gabapentin 300 mg 2 tablets at bedtime.  8.  Vitamin D2, 50 international units 3 times a week, Monday/Wednesday/Friday.  9.  Sucralfate 1 g 4 times a day.  10.  Quetiapine 300 mg 3  tablets at bedtime.  11.  Pantoprazole 40 mg daily.  12.  Metoprolol 25, half a tablet b.i.d.  13.  Linzess 290 mcg daily p.r.n.  14.  Meloxicam 15 mg daily.  15.  Dicyclomine 10 mg 2 tablets every 8 hours p.r.n.  16.  Tramadol 50 mg every 4 hours p.r.n.  17.  Naproxen 500 mg b.i.d.  18.  Gabapentin 300 mg b.i.d.  19.  Lantus 30 units b.i.d., which was decreased.  20.  Sliding scale insulin 5 units t.i.d. before meals.   DISPOSITION: Discharge with home health and physical therapy.   DISCHARGE TREATMENT: Monitor blood sugars before meals and at bedtime.   DISCHARGE OXYGEN: 2 L nasal cannula.   DISCHARGE DIET: Low sodium, ADA diet.   DISCHARGE ACTIVITY: As tolerated.   DISCHARGE REFERRAL: Diabetes education.   DISCHARGE FOLLOWUP: The patient will follow up with Dr. Enid Derry in 1 week.    TIME SPENT: Approximately 35 minutes.    ____________________________ Donell Beers. Benjie Karvonen, MD spm:MT D: 05/14/2014 12:31:18 ET T: 05/14/2014 12:47:58 ET JOB#: [id number]  cc: Destyni Hoppel P. Benjie Karvonen, MD, <Dictator> Enid Derry, MD Donell Beers Brandilee Pies MD ELECTRONICALLY SIGNED 05/14/2014 13:28

## 2014-12-26 NOTE — Discharge Summary (Signed)
PATIENT NAME:  [name], [name] MR#:  [id number] DATE OF BIRTH:  1966-03-27  DATE OF ADMISSION:  09/21/2013 DATE OF DISCHARGE:  09/23/2013  PRIMARY CARE PHYSICIAN:  [name]  DISCHARGE DIAGNOSES: 1.  Chronic obstructive pulmonary disease exacerbation.  2.  Hyperglycemia. 3.  Uncontrolled diabetes. 4.  Hypertension. 5.  Obstructive sleep apnea. 6.  Obesity.   CONDITION: Stable.  CODE STATUS:  Full code.   HOME MEDICATIONS: Please refer to the [facility] physician discharge instruction medication reconciliation list.   DIET: Low sodium, low fat, low cholesterol, ADA diet.   ACTIVITY: As tolerated.  FOLLOW-UP CARE:  With PCP within 1 to 2 weeks.   REASON FOR ADMISSION: Cough, shortness of breath with wheezing and with high blood sugar.   HOSPITAL COURSE: The patient is a 49 year old Caucasian female with a history of COPD, diabetes and OSA presented to the ED with cough, shortness of breath, wheezing and high blood sugar. [name] blood sugar was more than 500 at home and more than 600 in ED. For detailed history and physical examination, please refer to the admission note dictated by me.  On admission day, the patient's chest x-ray did not show any acute findings. BNP 40, BUN 25, creatinine 1.17, sodium 132, potassium 5.0.   1.  Chronic obstructive pulmonary disease exacerbation. After admission, the patient has been treated with Solu-Medrol IV with nebulizer, Xopenex. Symptoms have much improved. The patient has no wheezing. No shortness of breath, only has mild cough. [name] Solu-Medrol is discontinued. We will change to p.o. prednisone, then taper after discharge.  2.  For hyperglycemia. The patient was on Levemir 70 units b.i.d. at home. We increased to 80 b.i.d. with sliding scale.  Blood sugar is better but is still about 200 to 300, which is possibly due to steroid. I advsied that the patient continue Levemir 80 units b.i.d. with sliding scale and the other p.o. medication.  [name] hemoglobin A1c is 10.5 on this admission. 3.  The patient has mild hypomagnesemia, which was treated with supplement. Magnesium level increased to a normal range. 4.  The patient has no complaints today. Vital signs are stable. She is clinically stable. Will be discharged to home today. I discussed the patient's discharge plan with the patient, case manager and nurse.   TIME SPENT: About 37 minutes.    ____________________________ Demetrios Loll, MD qc:ce D: 09/23/2013 15:58:30 ET T: 09/23/2013 16:32:17 ET JOB#: [id number]  cc: Demetrios Loll, MD, <Dictator> Demetrios Loll MD ELECTRONICALLY SIGNED 09/23/2013 17:12

## 2014-12-26 NOTE — H&P (Signed)
PATIENT NAME:  [name], [name] MR#:  [id number] DATE OF BIRTH:  [birth year]  DATE OF ADMISSION:  05/27/2014  PRIMARY CARE PROVIDER: [facility].   EMERGENCY DEPARTMENT REFERRING PHYSICIAN: Baird Cancer. Quale, MD  CHIEF COMPLAINT: Dizziness, weakness, hypotension, UTI.   HISTORY OF PRESENT ILLNESS: The patient is a 49 year old white female with history of bipolar disorder, hypertension, type 2 diabetes, also has a history of fatty liver, sleep apnea, COPD who states that she has been not feeling well since yesterday. She has been feeling very dizzy and fell yesterday. She was seen by her primary care provider and told she had a sinus infection, but did not get the prescription filled. Since yesterday, the patient has been more sleepy and her speech has been slurred according to the husband. She slept all day. The patient came to the ED and was noted to have blood pressure in the 80s. Despite IV fluids, blood pressure continued to remain low. Therefore, we were asked to admit the patient. She denies any fevers, chills. Denies any chest pain, palpitations. She has had a dry cough. She uses oxygen only at nighttime.   PAST MEDICAL HISTORY: Significant for:  1.  Hypertension.  2.  Diabetes type 2.  3.  History of bipolar disorder.  4.  Morbid obesity.  5.  Obstructive sleep apnea.  6.  Hypothyroidism.  7.  COPD. 8.  Has a history of fatty liver.  9.  History of splenomegaly.   FAMILY HISTORY: Mother had kidney disease, on dialysis. Father died of suicide.   SOCIAL HISTORY: No smoking. No drinking. No drugs.   PAST SURGICAL HISTORY: Significant for total abdominal hysterectomy, cholecystectomy, leg operations, also has a stimulator in her bladder.   CURRENT MEDICATIONS: She is on vitamin D2 at 50,000 international units 3 times a week, tramadol 50 every 4 hours as needed, sucralfate 1 gram 4 times a day, quinapril 5 daily, quetiapine 900 mg daily, Protonix 40 one tablet p.o. daily,  metoprolol tartrate 12.5 b.i.d., Linzess 290 mcg 1 tablet p.o. daily, levothyroxine 75 mcg daily, Lantus 30 units b.i.d., lamotrigine 25 one tablet p.o. daily, lamotrigine 100 one tablet p.o. at bedtime. Humalog 5 units 3 times a day, gabapentin 300 one tablet p.o. b.i.d., gabapentin 600 mg at bedtime, fenofibrate acid 135 mg daily, duloxetine 120 daily, valproic acid 1000 mg b.i.d., dicyclomine 10 mg 2 capsules q. 8 hours, alprazolam 0.25 one tablet p.o. b.i.d.   REVIEW OF SYSTEMS: CONSTITUTIONAL: Denies any fevers. Complains of dizziness. No weight loss. No weight gain.  EYES: No blurred or double vision. No cataracts, no glaucoma.  EARS, NOSE AND THROAT: Denies any tinnitus. No hearing loss. No seasonal or year-round allergies. No difficulty swallowing. Has had some sinus pain.  RESPIRATORY: Complains of dry cough. Has COPD, but no wheezing, no hemoptysis. Has dyspnea. No pneumonia.  CARDIOVASCULAR: Denies any chest pain, orthopnea, edema or arrhythmia. No palpitation. No syncope.  GASTROINTESTINAL: No nausea, vomiting, diarrhea. No abdominal pain. No hematemesis. No melena.  GENITOURINARY: Denies any dysuria, hematuria, renal calculus or frequency.  ENDOCRINE: Denies any polyuria, nocturia, thyroid problems.  HEMATOLOGIC AND LYMPHATIC: Denies anemia, easy bruisability or bleeding.  SKIN: No acne. No rash.  MUSCULOSKELETAL: Denies any pain in the neck, back or shoulder. No gout.  NEUROLOGIC: No CVA, TIA, seizures.  PSYCHIATRIC: Has bipolar disorder.   PHYSICAL EXAMINATION:  GENERAL: The patient is a well-developed, well-nourished female in no acute distress.  VITAL SIGNS: Temperature 98.3, pulse 85, respirations 20, blood  pressure 84/48.  HEENT: Head atraumatic, normocephalic. Pupils equally round, reactive to light and accommodation. There is no conjunctival pallor. No sclerae icterus. Nasal exam shows no drainage or ulceration. Oropharynx is clear without any exudate.  NECK: Supple without  any thyromegaly.  CARDIOVASCULAR: Regular rate and rhythm. No murmurs, rubs, clicks or gallops.  LUNGS: Clear to auscultation bilaterally without any rales, rhonchi, wheezing.  ABDOMEN: Soft, nontender, nondistended. Positive bowel sounds x 4.  EXTREMITIES: No clubbing, cyanosis, edema.  SKIN: No rash.  LYMPH NODES: Nonpalpable. VASCULAR: Good DP and PT pulses.  PSYCHIATRIC: Not anxious or depressed.  NEUROLOGIC: Awake, alert, oriented x 3. No focal deficits.  MUSCULOSKELETAL: There is no erythema or swelling.   LABORATORY DATA AND EVALUATIONS: CT scan of the head shows generalized atrophy, minimal sinus disease. Chest x-ray shows no acute abnormality. Ammonia level 43. Troponin less than 0.02, CPK 121, CK-MB 1.3. WBC 6.5, hemoglobin 10.9, platelet count 222,000. Glucose 130, BUN 26, creatinine 1.61, sodium 137, potassium 3.8, chloride 101, CO2 is 28. LFTs showed albumin 2.8, TSH 1.65. Urinalysis: Leukocytes 2+, wbc's 23.   ASSESSMENT AND PLAN: The patient is a 49 year old white female with a history of hypertension, diabetes, bipolar disorder, morbid obesity, sleep apnea, hypothyroidism, chronic obstructive pulmonary disease, presents with dizziness, noted to have low blood pressure, possible urinary tract infection.  1.  Hypotension, possibly related to urinary tract infection, also could be related to multiple psychiatric medications. At this time, we will give her IV fluids, place her on IV antibiotics for the urinary tract infection. Hold metoprolol. We will check a random cortisol. 2.  Urinary tract infection. We will treat her with IV antibiotics, follow urine cultures.  3.  Hypothyroidism. Continue levothyroxine. 4.  Diabetes. The patient will be on sliding scale and a lower dose Lantus.  5.  History of bipolar disorder. Continue alprazolam. Valproic acid, duloxetine, gabapentin, lamotrigine, Seroquel. These medications need to be re-evaluated by her primary psychiatrist to decide if all  these are absolutely needed.  6.  Miscellaneous. The patient will be on Lovenox for deep venous thrombosis prophylaxis.  TIME SPENT: Sixty minutes.   ____________________________ Lafonda Mosses Posey Pronto, MD shp:TT D: 05/27/2014 20:43:23 ET T: 05/27/2014 21:20:19 ET JOB#: [id number]  cc: Joann Jorge H. Posey Pronto, MD, <Dictator> Alric Seton MD ELECTRONICALLY SIGNED 06/05/2014 13:54

## 2014-12-26 NOTE — H&P (Signed)
PATIENT NAME:  [name], [name] MR#:  [id number] DATE OF BIRTH:  [birth year]  DATE OF ADMISSION:  05/13/2014  PRIMARY CARE PHYSICIAN: [place].  EMERGENCY ROOM PHYSICIAN: Algis Liming. Jimmye Norman, MD.   CHIEF COMPLAINT: Dizziness.   HISTORY OF PRESENT ILLNESS: This patient is a 49 year old female with a history of bipolar disorder, hypertension, type 2 diabetes mellitus that had an episode of dizziness and fell while she was coming down her porch. The patient said she was feeling dizzy and collapsed. Denies any chest pain. No trouble breathing. No loss of consciousness. [name] blood pressure was low. When she came, it was 97/62. [name] lab work showed acute on chronic renal failure. I am repeating her labs, admitted on observation status for near syncope with hypotension and acute on chronic renal failure. The patient denies any other complaints. Denies any recent illness or sickness. She was in her usual state of health yesterday.   PAST MEDICAL HISTORY: Significant for hypertension, diabetes mellitus type 2, history of bipolar disorder, morbid obesity, obstructive sleep apnea, hypothyroidism.   FAMILY HISTORY: Mother had kidney disease. She is on dialysis. Father died of suicide.   SOCIAL HISTORY: No smoking, no drinking, no drugs.   PAST SURGICAL HISTORY: Significant for total abdominal hysterectomy, gallbladder operation, leg operations and also stimulator for her bladder that was placed a long time back.   MEDICATIONS:  1. Xanax 0.25 mg p.o. b.i.d.  2. Depakote 500 mg extended release 2 tablets twice daily.  3. Duloxetine 60 mg 2 capsules once a day. 4. Tricor 135 mg once a day.  5. Neurontin 300 mg 1 capsule 2 times daily in the morning and afternoon and 300 mg 2 capsules at bedtime.  6. Humalog 100 units ( takes 3 times daily as per sliding scale. 7. Lamictal. The patient takes a total of 125 mg at night.  8. Levothyroxine 0.075 mg daily.  9. Lantus 80 units  twice daily.  10. Magnesium oxide 5 mg p.o. b.i.d.  11. Metoprolol tartrate 25 mg, a half tablet twice daily.  12. Pantoprazole 40 mg p.o. daily.  13. Actos 15 mg daily.  14. Quinapril 5 mg daily. 15. Seroquel 300 mg 3 tablets at bedtime.  16. Sucralfate 1 gram 4 times daily.  17. Vitamin D2 - 50,000 units Monday, Wednesday and Friday.   REVIEW OF SYSTEMS:  CONSTITUTIONAL: The patient denies any fever. Does have dizziness, mainly when she moves her head.  EYES: No blurred vision.  ENT: The patient complains of tinnitus, more when she moves her head. No epistaxis. No difficulty swallowing.  RESPIRATORY: No cough. No wheezing.  CARDIOVASCULAR: No chest pain. No orthopnea. No PND. The patient had dizziness today.  GASTROINTESTINAL: No nausea. Had nausea yesterday, but says that she feels okay today.  GENITOURINARY: No dysuria.  ENDOCRINE: No polyuria or nocturia.  HEMATOLOGIC: No anemia.  INTEGUMENTARY: No skin rashes.  MUSCULOSKELETAL: No joint pains.  NEUROLOGIC: No numbness or weakness.  PSYCHIATRIC: She has bipolar disorder.   PHYSICAL EXAMINATION: VITAL SIGNS: Temperature 98.6, blood pressure 197/62, heart rate 82, respiratory rate 18.  GENERAL: The patient is alert, awake, oriented, a 49 year old female, answering questions appropriately. Well-developed, well-nourished, not in distress.  EYES: Pupils equally reacting to light. Extraocular movements intact. No scleral icterus.  HEAD: Normocephalic, atraumatic.  NOSE: No nasal lesions. No drainage.  EARS: No drainage or external lesions.   MOUTH: No lesions. No exudates.  NECK: Supple. No JVD. No carotid bruit. Thyroid in the  midline.  NECK: The patient has normal range of motion.  RESPIRATORY: Good respiratory effort. Clear to auscultation. No wheeze. No rales.  CARDIOVASCULAR: S1, S2 regular. No murmurs. No gallops.  GASTROINESTINAL: Soft, nontender, nondistended. Bowel sounds present. No organomegaly.  MUSCULOSKELETAL:  Normal gait and station. [name] has normal range of motion in the legs. Strength and tone are equal bilaterally.  SKIN: The patient has decreased skin turgor.  LYMPH NODES: No lymphadenopathy in the cervical or axillary region.  NEUROLOGIC: Cranial nerves 2 through 12 intact. Power 5/5 upper and lower extremities. Senses are intact. DTRs 2+ bilaterally.  PSYCHIATRIC: Mood and affect are within normal limits.   DIAGNOSTIC DATA: Head CT shows no acute evidence of hemorrhage, hydrocephalus or mass effect. [name] CT head is normal. CT of the cervical spine without contrast was done, which was also normal. No fractures. Lumbar spine x-ray shows no acute osseous abnormalities identified. Advanced L5-S1 facet arthrosis. EKG shows normal sinus rhythm. No ST-T changes at 87 beats per minute.   LABORATORY DATA: CBC: WBC 7.6, hemoglobin 11.3, hematocrit 34.8, platelets 257,000. Electrolytes: Sodium 142, potassium 5.2, chloride 109, bicarbonate 26, BUN 47, creatinine 1.82, glucose 135. [name] LFTs are within normal limits except AST slightly up at 41. The patient valproic acid is 71. Troponin less than 0.02. UA is clear. No leukocyte esterase. The patient had labs at 1:00 in the afternoon and at that time, the potassium was 5.8. It did come down to 5.2 on repeat lab work.   ASSESSMENT AND PLAN:  The patient is a 49 year old female with near syncope secondary to dehydration. Admit her to observation status. Start her on intravenous fluids and see how she does.  1. Acute renal failure secondary to hypotension. Continue intravenous fluids. Hold the blood pressure medications, mainly beta blockers and angiotensin converting enzyme inhibitors, monitor kidney function and continue aggressive hydration.  2. Mild hyperkalemia, unable to exclude rhabdomyolysis. CK level was not done in the emergency room. We ordered that and levels are pending.  3. History of bipolar depression. She is on Depakote,  Seroquel,  Xanax. Cymbalta and Lamictal. The patient says that he takes everything as listed. I have confirmed the dosages with her, so we have restarted all the medications.  4. Gastroesophageal reflux disease. Continue proton pump inhibitors.  5. History of hyperlipidemia. Continue with Tricor at 135 mg daily.  6. Diabetes mellitus, type 2. Continue her home medications, mainly the Lantus and the Humalog.  7. Hypothyroidism. Continue Synthroid 75 mcg daily.  8.  I have decreased the Lantus dose because he was not eating since yesterday due to nausea, that can be adjusted according to her sliding scale and also depending on her p.o. intake. 9.  Positional vertigo. I will add meclizine and see how she does.   TIME SPENT ON HISTORY AND PHYSICAL: About 60 minutes.    ____________________________ Epifanio Lesches, MD sk:TT D: 05/13/2014 17:52:51 ET T: 05/13/2014 18:27:59 ET JOB#: [id number]  cc: Epifanio Lesches, MD, <Dictator> Epifanio Lesches MD ELECTRONICALLY SIGNED 06/02/2014 11:08

## 2014-12-26 NOTE — Consult Note (Signed)
Brief Consult Note: Diagnosis: Atypical chest pain.   Patient was seen by consultant.   Consult note dictated.   Discussed with Attending MD.   Comments: may go home with f/u this  monday 10 am.  Electronic Signatures: Angelica Ran (MD)  (Signed 21-Feb-15 10:48)  Authored: Brief Consult Note   Last Updated: 21-Feb-15 10:48 by Angelica Ran (MD)

## 2014-12-26 NOTE — H&P (Signed)
PATIENT NAME:  [name], ALBIN MR#:  [id number] DATE OF BIRTH:  01-15-1966  DATE OF ADMISSION:  10/24/2013  PRIMARY CARE PHYSICIAN: Dr. Enid Derry.   REFERRING PHYSICIAN: Dr. Darl Householder.   CHIEF COMPLAINT: Chest pain.   HISTORY OF PRESENT ILLNESS: [name] is a 49 year old female with a history of hypertension, COPD, hypothyroidism, continued tobacco use, diabetes mellitus, bipolar disorder. Presented to the Emergency Department with complaints of chest pain for the last 2 days. The patient experienced yesterday 2 episodes of chest pain on the left side of the chest radiating to the left arm and left jaw, sharp in nature. This worsens with increased taking breath, with any movement. Has mild cough. No productive sputum. Had some nausea associated with chest pain. Did not have any relieving factors; however, this morning the patient experienced similar pain. Did not resolve by itself. Concerning this, came to the Emergency Department. Workup in the Emergency Department with EKG and cardiac enzymes was unremarkable. The patient states had similar pain 3 years back. Underwent a left heart cath which showed nonobstructive disease. Concerning about the patient's multiple medical problems with multiple risk factors, the decision is made to admit the patient for observation.   PAST MEDICAL HISTORY:  1. Hypertension.  2. Hyperlipidemia.  3. Diabetes mellitus, insulin dependent.  4. Bipolar disorder.  5. Migraine headaches.  6. COPD.   7. Hypothyroidism.  8. Restless leg syndrome.  9. Nonobstructive coronary artery disease.  10. Obstructive sleep apnea.   11. Diabetic neuropathy.   HOME MEDICATIONS:  1. Vitamin D2 50,000 units once a week.  2. Vitamin B12 once a day.  3. Victoza 18 mcg daily subcutaneous.  4. Ventolin 4 times a day.  5. Symbicort 2 puffs 2 times a day.  6. Quinapril 5 mg once a day.  7. Quetiapine 900 mg once a day.  8. Magnesium oxide 1 tablet 2 times a day.  9. Levothyroxine 75  mcg once a day.  10. Lantus 80 units 2 times a day.  11. Lamotrigine 25 mg once a day.  12. Lamotrigine 100 mg at bedtime.  13. Gabapentin 300 mg 3 times a day.  14. Fenofibrate acid 135 mg once a day.  15. Duloxetine 60 mg 2 capsules once a day.  16. Depakote 2 tablets 1000 mg 2 times a day.  17. Crestor 10 mg once a day.  18. Alprazolam 0.25 mg 2 times a day.   SOCIAL HISTORY: Denies smoking, drinking alcohol or using illicit drugs.   FAMILY HISTORY: Mother  kidney disease on dialysis. Father died of suicide.   REVIEW OF SYSTEMS:  CONSTITUTIONAL: Denies any generalized weakness.  EYES: No change in vision.  ENT: No change in hearing.  RESPIRATORY: Has no cough, shortness of breath.  CARDIOVASCULAR: Chest pain.  GASTROINTESTINAL: Mild nausea. No vomiting or diarrhea.  GENITOURINARY: No dysuria or hematuria.  ENDOCRINE: Has history of diabetes mellitus.  HEMATOLOGIC: No easy bruising or bleeding.  SKIN: No rash or lesions.  MUSCULOSKELETAL: No joint pains and aches.  NEUROLOGIC: No weakness or numbness in any part of the body.  PSYCHIATRIC: Has history of bipolar disorder.   PHYSICAL EXAMINATION:  GENERAL: This is a well-built, well-nourished, age-appropriate female lying down in the bed, not in distress.  VITAL SIGNS: Temperature 98.4, pulse 92, blood pressure 114/65, respiratory rate of 20, oxygen saturation is 94% on room air.  HEENT: Head normocephalic, atraumatic. There is no scleral icterus. Conjunctivae normal. Pupils equal and react to light. Extraocular movements  are intact. Mucous membranes moist. No pharyngeal erythema.   NECK: Supple. No lymphadenopathy. No JVD. No carotid bruit.  CHEST: Has focal tenderness on the left side of the chest. Palpable tenderness to the back LUNGS: Bilaterally clear to auscultation.  HEART: S1, S2 regular. No murmurs are heard.  ABDOMEN: Bowel sounds present. Soft, nontender.  EXTREMITIES: No pedal edema. Pulses 2+.  NEUROLOGIC: The  patient is alert, oriented to place, person and time. Cranial nerves II through XII intact. Motor 5/5 in upper and lower extremities.   LABORATORY DATA: CBC and CMP are completely within normal limits except for potassium of 5.6. Mild elevation of the AST of 62. Troponin less than 0.02, CK-MB 1.1. Chest x-ray, 1 view portable: Heart slightly enlarged. No edema or consolidation.   EKG, 12-lead: Normal sinus rhythm with no ST-T wave abnormalities.    ASSESSMENT AND PLAN: [name] is a 48 year old female who comes to the Emergency Department with complaints of chest pain.  1. Chest pain: This seems atypical, musculoskeletal pain. Concerning the patient's risk factors of diabetes mellitus, hypertension, obesity, will admit the patient under observation. Cycle cardiac enzymes x 3. If negative, the patient could be discharged home. The patient had a heart cath done 3 years back which showed nonobstructive disease.  2. Diabetes mellitus: Continue with home medications.  3. Hypertension: Continue with home medications.  4. Keep the patient on deep vein thrombosis prophylaxis with Lovenox.   TIME SPENT: 45 minutes.   ____________________________ Monica Becton, MD pv:gb D: 10/24/2013 23:17:58 ET T: 10/24/2013 23:38:27 ET JOB#: [id number]  cc: Monica Becton, MD, <Dictator> Enid Derry, MD Monica Becton MD ELECTRONICALLY SIGNED 11/06/2013 1:23

## 2014-12-26 NOTE — H&P (Signed)
PATIENT NAME:  [name], [name] MR#:  [id number] DATE OF BIRTH:  1966/07/28  DATE OF ADMISSION:  06/19/2014  PRIMARY CARE PHYSICIAN: Enid Derry, MD   CHIEF COMPLAINT: Weakness, dizziness, and altered mental status.   HISTORY OF PRESENT ILLNESS: This is a 49 year old female who presents to the Emergency Room brought in by her husband due to weakness, confusion, and feeling dizzy. The patient apparently as per the husband has been more and more confused over the past day or so.  She has had no other complaints of abdominal pain, nausea, vomiting, diarrhea, melena,  hematochezia, hematuria, shortness of breath, fevers, chills. No other associated symptoms. When she presented to the Emergency Room she could not recall how she got here and what she was here for her. Upon further evaluation by the ER physician, her ammonia level was noted to be over 170. She does have a history of fatty liver disease and is on lactulose. She has been taking the lactulose as scheduled but was noted to have significant elevated ammonia still. Hospitalist services were contacted for further treatment and evaluation.   REVIEW OF SYSTEMS:  CONSTITUTIONAL: No documented fever. No weight gain, no weight loss.  EYES: No blurry or double vision.  ENT: No tinnitus. No postnasal drip. No redness of the oropharynx.  RESPIRATORY: No cough, no wheeze, no hemoptysis, no dyspnea.  CARDIOVASCULAR: No chest pain. No orthopnea. No palpitation or syncope.  GASTROINTESTINAL: No nausea, no vomiting or diarrhea, no abdominal pain. No melena or hematochezia.  GENITOURINARY: No dysuria or hematuria.  ENDOCRINE: No polyuria or nocturia. No heat or cold intolerance.  HEMATOLOGIC: No anemia. No bruising. No bleeding.  INTEGUMENTARY: No rashes. No lesions.  MUSCULOSKELETAL: No arthritis, no swelling, no gout.  NEUROLOGIC: No numbness or tingling. No ataxia. No seizure activity.   PSYCHIATRIC: Positive bipolar disorder. Positive depression. No  ADD.   PAST MEDICAL HISTORY: Consistent with fatty liver disease, bipolar disorder with depression, diabetes, hypertension, obstructive sleep apnea, hypothyroidism, COPD, diabetic neuropathy.   ALLERGIES: CODEINE, MORPHINE, REMERON AND VICODIN.   SOCIAL HISTORY: No smoking. No alcohol abuse. No illicit drug abuse. Lives with her husband.   FAMILY HISTORY: Mother and father:  Mother is alive. She is diabetic. She has renal disease, is on dialysis. Father died from a suicide. Cancer does run in her family. Her grandmother died from cancer.   CURRENT MEDICATIONS: As follows: Depakote 500 mg 2 tabs b.i.d. Cymbalta 120 mg daily, fenofibric acid 135 mg 1 tab daily, gabapentin 300 mg 1 tab b.i.d. and then gabapentin 300 mg two tabs at bedtime. Humalog 5 units t.i.d. with meals, lactulose 30 mL daily, Lamictal 125 mg at bedtime, Lantus 30 units b.i.d., Synthroid 75 mcg daily, Protonix 40 mg daily, Seroquel 900 mg at bedtime, quinapril 5 mg daily, and vitamin D2 50,000 international units 3 times a week Monday, Wednesday, Friday.   PHYSICAL EXAMINATION: Presently is as follows:  VITAL SIGNS:  [name] vital signs are noted to be: Temperature 97.8, pulse 91, respirations 20, blood pressure 105/61, saturations 94% on room air.  GENERAL: The patient is a pleasant -appearing female, lethargic but in no apparent distress.  HEAD, EYES, EARS, NOSE, AND THROAT:  She is atraumatic, normocephalic. Extraocular muscles are intact. Pupils equal and react to light. Sclerae are anicteric. No conjunctival injection. No pharyngeal erythema.  NECK: Supple. No jugular venous distention. No bruits, no lymphadenopathy, no thyromegaly.  HEART: Regular rate and rhythm. No murmurs, no rubs, no clicks.  LUNGS: Clear  to auscultation bilaterally. No rales or rhonchi, no wheezes.  ABDOMEN: Soft, flat, nontender, nondistended. Has good bowel sounds. No hepatosplenomegaly appreciated.  EXTREMITIES: No evidence of any cyanosis,  clubbing, or peripheral edema. Has +2 pedal and radial pulses bilaterally.  NEUROLOGICAL: The patient is alert, awake, oriented x 2, does have a positive flapping tremor. No focal motor or sensory deficits appreciated bilaterally.  SKIN: Moist and warm with no rashes.  LYMPHATIC: There is no cervical or axillary lymphadenopathy.   LABORATORY EXAMINATION: Serum glucose of 469, BUN 28, creatinine 1.5, sodium 130, potassium 4.3, chloride 90, bicarb 26, ammonia is 174. LFTs within normal limits. White cell count 7.3, hemoglobin 12, hematocrit 36.6, platelet count 283,000. Urinalysis within normal limits.   ASSESSMENT AND PLAN: A 49 year old female with a history of bipolar disorder, fatty liver disease, hypertension, diabetes, obstructive sleep apnea, hypothyroidism, chronic obstructive pulmonary disease who presents to the hospital due to dizziness, weakness and confusion, noted to be in hepatic encephalopathy.  1.  Hepatic encephalopathy. This is likely the cause of the patient's weakness, dizziness and confusion. The exact etiology of the encephalopathy is unclear. There is no evidence of acute infection. There is no evidence of GI bleed or any evidence of spontaneous bacterial peritonitis or ascites. Ammonia is elevated to 174. I will increase her lactulose from daily to t.i.d., titrate to bowel movements at least 2 daily. Repeat ammonia level in the morning and follow her clinically.   2.  Diabetes. Blood sugars are uncontrolled. I will continue her Lantus and NovoLog with meals and add some sliding scale insulin coverage. Continue carb-controlled diet. Follow blood sugars closely.  3.  Hypertension, presently hemodynamically stable. Continue quinapril.  4.  Diabetic neuropathy. Continue Neurontin.  5.  History of bipolar disorder. Continue Lamictal and Depakote and Seroquel.  6.  Depression. Continue Cymbalta.  7.  Hypothyroidism. Continue Synthroid.   CODE STATUS: The patient is a full code.    TIME SPENT: 50 minutes.   ____________________________ Belia Heman. Verdell Carmine, MD vjs:AT D: 06/19/2014 19:53:53 ET T: 06/19/2014 22:00:06 ET JOB#: [id number]  cc: Belia Heman. Verdell Carmine, MD, <Dictator> Henreitta Leber MD ELECTRONICALLY SIGNED 07/13/2014 10:58

## 2014-12-26 NOTE — Consult Note (Signed)
PATIENT NAME:  [name], [name] MR#:  [id number] DATE OF BIRTH:  Sep 29, 1965  CARDIOLOGY CONSULTATION   DATE OF CONSULTATION:  10/25/2013  CONSULTING PHYSICIAN:  Dionisio David, MD  HISTORY OF PRESENT ILLNESS: This is a 49 year old white female with a past medical history of hypertension, COPD, hypothyroidism, nonobstructive coronary artery disease on cardiac catheterization 3 years ago, who comes in again with chest pain to the hospital. The chest pain was described as pressure type, 8/10, radiating to the left jaw as well as left arm. When I went to see her at that time, she was comfortably sleeping. She denied any chest pain, but she did have it today and last night.   PAST MEDICAL HISTORY: History of hypertension, hyperlipidemia, diabetes mellitus, insulin-dependent, bipolar disorder, migraine headaches, COPD, hypothyroidism, restless leg syndrome, nonobstructive CAD, obstructive sleep apnea and diabetic neuropathy.   MEDICATIONS: Crestor, quinapril 5 mg and other diabetic medications.   SOCIAL HISTORY: Denies EtOH abuse or smoking.   FAMILY HISTORY: Positive for coronary artery disease.   PHYSICAL EXAMINATION:  GENERAL: She is alert, oriented x3, in no acute distress.  VITAL SIGNS: Stable.  NECK: Revealed no JVD.  LUNGS: Clear.  HEART: Regular rate and rhythm. Normal S1, S2. No audible murmur.  ABDOMEN: Soft, nontender, positive bowel sounds.  EXTREMITIES: No pedal edema.  NEUROLOGICAL: She appears to be intact.   DIAGNOSTIC STUDIES: EKG shows normal sinus rhythm, 99 beats per minute, low voltage, nonspecific ST-T changes. Cardiac enzymes x3 are negative.   ASSESSMENT AND PLAN: The patient has atypical chest pain. Cardiac enzymes x3 are negative. There are no acute EKG changes. Based on the criteria, she is not having acute coronary syndrome and myocardial infarction has been ruled out. It is reasonable to discharge her and follow up on Monday at 10:00 in the office. She did have  appointment this Thursday, but will move the appointment up to this Monday at 10:00.   Thank you very much for referral.   ____________________________ Dionisio David, MD sak:lb D: 10/25/2013 10:53:26 ET T: 10/25/2013 11:24:06 ET JOB#: [id number]  cc: Dionisio David, MD, <Dictator> Dionisio David MD ELECTRONICALLY SIGNED 11/17/2013 12:09

## 2014-12-26 NOTE — Discharge Summary (Signed)
PATIENT NAME:  [name], [name] MR#:  [id number] DATE OF BIRTH:  01-03-1966  DATE OF ADMISSION:  10/24/2013 DATE OF DISCHARGE:  10/25/2013  PRESENTING COMPLAINT: Chest pain.  DISCHARGE DIAGNOSES: 1. Chest pain appears atypical.  2. Morbid obesity.  3. Hypertension.  4. Obstructive sleep apnea.  5. CODE STATUS: FULL CODE.   MEDICATIONS: 1. Vitamin D2, 50,000 units p.o. daily on Wednesdays.  2. Vitamin B12 p.o. daily.  3. Ventolin HFA 2 puffs 4 times a day as needed.  4. Divalproex extended release 500 mg 2 tablets twice a day.  5. Duloxetine 60 mg 2 capsules once a day.  6. Fenofibrate 135 mg p.o. daily.  7. Lamotrigine 25 mg once a day at bedtime along with lamotrigine 100 mg, which comes to 125 mg at bedtime.  8. Levothyroxine 75 mcg p.o. daily.  9. Quinapril 5 mg daily.  10. Crestor 10 mg at bedtime.  11. Victoza 1.8 mg subcutaneous daily.  12. Mag-Ox 400 mg b.i.d.  13. Xanax 0.25 p.o. b.i.d.  14. Humalog sliding scale.  15. Lantus 80 units b.i.d.  16. Metformin 1000 mg b.i.d.  17. Symbicort 80/4.5 2 puffs b.i.d.  18. Seroquel 300 mg 3 tablets that is 900 mg at bedtime.  19. Gabapentin 300 mg 1 capsule t.i.d.   FOLLOWUP:  1. Follow up with Dr. Neoma Laming on Monday February 23 at 10:00 a.m.  2. Follow with your family physician Enid Derry at [facility] in 1 to 2 weeks.   Cardiac enzymes x 3 negative.    EKG: Normal sinus rhythm with no acute ST elevation or depression.   cholesterol is 141, HDL is 9. CBC within normal limits.   Basic metabolic panel within normal limits, except glucose of 220.   CARDIOLOGY CONSULTATION: With Dr. Neoma Laming.   SUMMARY OF HOSPITAL COURSE: [name] is a 49 year old Caucasian female with multiple medical problems who comes to the Emergency Room with chest pain for 3 to 4 days. She was admitted with:  1. Chest pain, which seems atypical considering the patient's risk factors with diabetes, hypertension, obesity.  She was admitted, ruled out for acute myocardial infarction with three sets of negative cardiac enzymes and no acute EKG changes. She had a cardiac catheter about three years ago, which was nonobstructive, diffuse minimal disease. The patient was seen by Dr. Humphrey Rolls and was okay to go home. Follow up as outpatient for further work-up. Her echo in 2014 showed no wall motion abnormality, ejection fraction of 60%.  2. Type 2 diabetes. Continue home medications.  3. Hypertension. She was resumed again on her home blood pressure meds. Overall hospital stay remained stable. The patient remained a FULL CODE.   ____________________________ Hart Rochester. Posey Pronto, MD sap:sg D: 10/26/2013 06:59:22 ET T: 10/26/2013 13:02:55 ET JOB#: [id number]  cc: Xylia Scherger A. Posey Pronto, MD, <Dictator> Dionisio David, MD Enid Derry, MD  Ilda Basset MD ELECTRONICALLY SIGNED 11/02/2013 13:39

## 2014-12-26 NOTE — Consult Note (Signed)
PATIENT NAME:  [name], [name] MR#:  [id number] DATE OF BIRTH:  05/31/1966  DATE OF CONSULTATION:  06/29/2014  REFERRING PHYSICIAN:   CONSULTING PHYSICIAN:  Gonzella Lex, MD  IDENTIFYING INFORMATION AND REASON FOR CONSULTATION: A 49 year old woman with a history of bipolar disorder and a more recent history of liver failure, admitted to the hospital with altered mental status, the original consult was for possible overdose.   HISTORY OF PRESENT ILLNESS: Information obtained from the patient, the chart and review of the specialist on call consult. She was brought into the hospital with an altered mental status. Story was that her husband found her slumped over. Initially, there was some concern of overdose because of an empty Xanax bottle, but I think that seems less likely at this point. The patient completely denies it. She was found to have an elevated ammonia, which has been a chronic recurrent problem for her for the last couple of years. She is now much improved from when she came into the hospital. The patient denies having overdosed on any of her medicines. It is conceivable that she could have been confused and taken an excessive amount of medicine, but she does not have any past history of being someone who overdoses on or abuses her medicine. She reports that her mood is currently feeling a little bit edgy in the hospital because she has not gotten her medicine and she is concerned that she did not sleep last night.   PAST PSYCHIATRIC HISTORY: Long history of bipolar disorder with a great many hospitalizations over the years. She has been tried on a large number of mood stabilizers over the years. For the last couple of years or more she had been on Depakote and Cymbalta. Unfortunately, she also has developed a problem with chronic elevated ammonia and liver insufficiency, which has just been getting worse over the last couple of years. She currently sees a doctor at [facility] here in town.    SUBSTANCE ABUSE HISTORY: No history of alcohol or drug abuse history.   SOCIAL HISTORY: Lives with her husband.   PAST MEDICAL HISTORY: She had developed liver problems, now is requiring lactulose, magnesium supplementation, possible infection when she came in, has diabetes, requires insulin.   CURRENT MEDICATIONS: Quetiapine 900 mg at night, lactulose 30 mL q. 6 hours, ondansetron p.r.n. for nausea, insulin sliding scale plus detemir 15 units q. 12 hours.   ALLERGIES: CODEINE, MORPHINE, REMERON AND VICODIN.   FAMILY HISTORY: Not specified at this time.   REVIEW OF SYSTEMS: Anxious. No psychosis. No hallucinations, no suicidal or homicidal ideation, feeling a little bit tired, otherwise negative review of systems.   MENTAL STATUS EXAMINATION: The patient was awake, pleasant and cooperative. Eye contact good. Psychomotor activity limited. Speech easily understood, slightly decreased in amount. Affect smiling, reactive, appropriate. Mood was stated as a little nervous. Thoughts were slow but lucid. No obvious loosening of associations or delusions. Denies auditory or visual hallucinations. Denies suicidal or homicidal ideation. She is alert and oriented x 4. Judgment and insight seem adequate. I did not test her short and long-term memory at this point.   ASSESSMENT: This is a 49 year old woman who has recurrent episodes of hepatic encephalopathy. It seems extremely likely to me that these are directly related to the continued use of Depakote. The incidence of Depakote causing liver failure, specifically manifesting as elevated ammonia levels is well known. The illness does not have any other clear cause in her case and started fairly soon  after she became regularly using Depakote. Additionally, the Cymbalta is contraindicated when liver insufficiency is detected. It is less likely to be a cause of this degree of liver failure, but it certainly can do it. The Cymbalta may be less of a problem since  the liver enzymes are only mildly elevated compared to the ammonia level. I see that the specialist on call had recommended decreasing doses of both of those medicines, but that she is not being given them at all right now, which seems appropriate.   TREATMENT PLAN: The patient should not be restarted on Depakote in my opinion. I would also discontinue the Cymbalta. She will be continued on the Seroquel 900 mg at night and lamotrigine 125 mg a day. Longer term, I would note that the lamotrigine level may need to be increased because co-administration of Depakote and lamotrigine causes typically an elevated lamotrigine level, so without the Depakote she might need more of it, but for this time that should be satisfactory. I will follow up as needed.   DIAGNOSIS, PRINCIPAL AND PRIMARY:  AXIS I: Delirium due to medical condition, hepatic encephalopathy related likely to medication use.   SECONDARY DIAGNOSES:  AXIS I: Bipolar disorder, most recent episode mixed.   ____________________________ Gonzella Lex, MD jtc:TT D: 06/29/2014 21:02:33 ET T: 06/29/2014 21:23:59 ET JOB#: [id number]  cc: Gonzella Lex, MD, <Dictator> Gonzella Lex MD ELECTRONICALLY SIGNED 07/11/2014 14:52

## 2014-12-26 NOTE — H&P (Signed)
PATIENT NAME:  [name], [name] MR#:  [id number] DATE OF BIRTH:  [birth year]  REFERRING EMERGENCY ROOM PHYSICIAN:  Debbrah Alar, MD  PRIMARY CARE PHYSICIAN: Enid Derry, MD, with [facility]   CHIEF COMPLAINT: Elevated blood sugars, weakness, and dizziness.   HISTORY OF PRESENT ILLNESS: This very pleasant 49 year old female with past medical history of diabetes, fatty liver disease, hypertension, COPD, and coronary artery disease, who was recently admitted at [facility] from September 23 to September 24 with similar symptoms. Presents today after noting that her blood sugars have been elevated. She reports that for the past few days her blood sugars have been in the 500s, despite taking her home insulin as prescribed. She has also been dizzy and felt presyncope. She has had increasing urinary frequency without dysuria. No fevers, chills, nausea, vomiting, or diarrhea. No shortness of breath, cough, or chest pain.   PAST MEDICAL HISTORY: 1.  Fatty liver disease with hypoalbuminemia and elevated ammonia. She has been referred to Dr. Allen Norris for further evaluation. EGD and colonoscopy are scheduled for October 14.  2.  Hypertension.  3.  Diabetes mellitus type 2.  4.  Bipolar disorder.  5.  Suspected obstructive sleep apnea.  6.  Obesity.  7.  COPD. 8.  Coronary artery disease.  9.  Hypothyroidism.  PAST SURGICAL HISTORY: 1.  Total abdominal hysterectomy.  2.  Cholecystectomy.  3.  Bladder stimulator implantation.   SOCIAL HISTORY: The patient lives with her husband. She did not smoke cigarettes or drink alcohol. She does not use a cane or walker or any other assistive device for mobility. She is disabled due to bipolar disorder.   FAMILY MEDICAL HISTORY: Her mother had end-stage renal disease and was on dialysis, mother also had coronary artery disease. Father died of suicide, but had history of stroke. Multiple grandparents had history of cancer of unknown primary.   ALLERGIES:  MORPHINE CAUSES ANAPHYLAXIS. VICODIN CAUSES ITCHING. CODEINE CAUSES ITCHING. REMERON CAUSES SWELLING.   HOME MEDICATIONS: 1.  Quinapril 5 mg 1 tablet once a day.  2.  Quetiapine 300 mg 3 tablets once a day at bedtime.  3.  Pantoprazole 40 mg 1 tablet once a day.  4.  Linzess 290 mcg oral capsule, 1 capsule once a day as needed.  5.  Levothyroxine 75 mcg 1 tablet once a day.  6.  Lantus 100 units/mL subcutaneous solution 30 units twice a day.  7.  Lamotrigine 125 mg once a day at bedtime.  8.  Humalog 100 units/mL subcutaneous solution, 5 units 3 times a day before meals. 9.  Gabapentin 300 mg 1 capsule twice a day in the morning and afternoon.  10.  Gabapentin 2 capsules once a day at bedtime.  11.  Fenofibric acid 135 mg oral delayed release capsule 1 capsule once a day.  12.  Duloxetine 60 mg 2 capsules once a day.  13.  Divalproex sodium 500 mg extended release capsule 2 tablets orally twice a day.  REVIEW OF SYSTEMS: CONSTITUTIONAL: Negative for fevers or chills. Positive for weakness and fatigue. Negative for weight change.  HEENT: No change in hearing or vision. No pain in the eyes or ears. No difficulty swallowing. No pain in the mouth.  RESPIRATORY: No cough, wheezing, hemoptysis, or painful respiration.  CARDIOVASCULAR: No palpitations, chest pain, syncope, edema.  GASTROINTESTINAL: No nausea, vomiting, diarrhea.  GENITOURINARY: Negative for dysuria, positive for increasing frequency.  MUSCULOSKELETAL: Negative for change in baseline back pain due to arthritis, no joint swelling,  redness, or trauma.  NEUROLOGIC: Positive for generalized weakness and presyncopal feelings. No history of seizure, memory loss, focal numbness, or weakness.  PSYCHIATRIC: No recent change in anxiety or depression. Does have a history of bipolar disorder, which is controlled.   PHYSICAL EXAMINATION: VITAL SIGNS: Temperature 98.3, pulse 82, respirations 18, blood pressure 126/64, oxygenation 96% on room  air.  GENERAL: No acute distress.  HEENT: Pupils are equal, round, and reactive to light. Pupils are slightly dilated. Conjunctivae are clear with no icterus. No injection. Extraocular motion intact. Oral mucous membranes are dry. Posterior oropharynx is clear with no exudate, no edema. Good dentition. No cervical lymphadenopathy. Trachea is midline. Thyroid is nontender.  RESPIRATORY: Lungs are clear to auscultation bilaterally with good air movement.  CARDIOVASCULAR: Regular rate and rhythm, no murmurs, rubs, or gallops. No bruit. Peripheral pulses are 2+, there is no dependent edema.  ABDOMEN: Soft, slightly distended versus obese. Diffusely tender, worst in the upper quadrants bilaterally and the midepigastric area. No guarding, no rebound, no hepatosplenomegaly.  MUSCULOSKELETAL: Strength is 4/4 throughout. No tender or swollen joints. Range of motion of the joints is normal.  NEUROLOGIC: Cranial nerves II through XII are grossly intact. Sensation and strength are intact, nonfocal neurologic examination.  PSYCHIATRIC: The patient is alert and oriented x 4. Has good insight. Is calm. No signs of uncontrolled anxiety or depression.   LABORATORY DATA: Sodium 136, potassium 4.3, chloride 99, bicarbonate 29, BUN 28, creatinine 1.26. Blood glucose 418, serum ammonia level is 70, total protein 5.5, albumin 2.9. LFTs otherwise normal. White blood cells 6.4, hemoglobin 10.7, platelets 243,000. MCV is 92. UA shows 85 white blood cells per high-powered field.   IMAGING: None.   ASSESSMENT AND PLAN: 1.  Urinary tract infection: Urine cultures are pending. She has received Rocephin in the Emergency Room and will continue to receive Rocephin daily until culture results can direct antibiotic therapy.  2.  Hyperglycemia due to uncontrolled diabetes mellitus: She is not in diabetic ketoacidosis. Will resume home dose insulin and provide intravenous fluids for hydration. Monitor blood sugars closely.  Hyperglycemia may be due to urinary tract infection. We will check a hemoglobin A1c.  3.  Elevated ammonia in a patient with history of fatty liver disease: Continue with lactulose as prescribed after last admission. Provide intravenous fluid hydration. Recheck ammonia level in the morning. At this point she is alert and oriented x 3. Does not show any signs of hepatic encephalopathy.  4.  Anemia: Stable from prior admission. She does have iron deficiency anemia by labs performed during last admission with decreased serum iron and elevated TIBC. Also may have some component of anemia of chronic disease due to hepatic disease. Continue to monitor.  5.  Fatty liver disease: She does have further workup planned in the outpatient setting with Dr. Allen Norris. At this time, I will not consult gastroenterology for inpatient management.  6.  Hypertension: Controlled at this time. Continue home regimen.  7.  Hypothyroidism: Continue Synthroid.  8.  Chronic obstructive pulmonary disease: No current exacerbation. She is not on any home medications for chronic obstructive pulmonary disease.  9.  Prophylaxis: Start heparin, continue PPI.   TIME SPENT ON ADMISSION: Fifty minutes.    ____________________________ Earleen Newport. Volanda Napoleon, MD cpw:LT D: 06/10/2014 18:25:00 ET T: 06/10/2014 19:31:13 ET JOB#: [id number]  cc: Barnetta Chapel P. Volanda Napoleon, MD, <Dictator> Aldean Jewett MD ELECTRONICALLY SIGNED 06/15/2014 14:02

## 2014-12-26 NOTE — Discharge Summary (Signed)
PATIENT NAME:  [name], [name] MR#:  [id number] DATE OF BIRTH:  12-Jan-1966  DATE OF ADMISSION:  06/19/2014 DATE OF DISCHARGE:  06/20/2014  DISCHARGE DIAGNOSES:  1.  Hepatic encephalopathy.  2.  Hypertension.  3.  Diabetes mellitus.   DISCHARGE MEDICATIONS:  1.  Duloxetine 60 mg 2 capsules daily.  2.  Fenofibric acid 135 mg daily.  3.  Lamotrigine 25 mg 5 tablets once a day at bedtime.  4.  Levothyroxine 75 mcg daily.  5.  Valproate 1000 mg 2 times a day.  6.  Neurontin 300 mg 1 capsules orally 2 times a day.  7.  Neurontin 300 mg 3 tablets daily at bedtime.  8.  Protonix 40 mg daily.  9.  Lantus 30 units subcutaneously 2 times a day.  10. Quinapril 5 mg oral once a day.  11. Vitamin D2 fifty thousand units once a week.  12. Humalog 5 units subcutaneously 3 times a day before meals.  13. Lactulose 30 mL orally 3 times a day.  14. Rifaximin 550 mg orally 2 times a day.   DISCHARGE INSTRUCTIONS: Diabetic low-salt diet. Activity as tolerated. To follow up with Dr. Allen Norris and PCP in 1-2 weeks. The patient has been recommended to continue her lactulose 3 times a day and reduce to 2 times a day for bowel movements greater than 3 a day.   IMAGING STUDIES: None.   ADMITTING HISTORY AND PHYSICAL: Please see detailed H and P dictated by Dr. Verdell Carmine. In brief, a 49 year old Caucasian female patient with history of hepatic encephalopathy previously, secondary to severe hepatic steatosis, presented to the hospital complaining of dizziness, weakness, and confusion. The patient was found to have ammonia elevated at 174 and was admitted to the hospitalist service.   HOSPITAL COURSE:  1.  Hepatic encephalopathy. The patient placed on increased dose of Lasix. She was taking it once a day at home. This was increased to 3 times a day, with which her symptoms improved. Ammonia level has drastically reduced. The patient feels back to baseline and is being discharged home. I have also added rifaximin to her  regimen for better control of her ammonia, hepatic encephalopathy. The patient is on valproate at home, which does tend to increase the numbers on the ammonia.  2. Diabetes, hypertension, well controlled.   Prior to discharge the patient has ambulated in the hallway.   PHYSICAL EXAMINATION:  ABDOMEN: Nontender.  LUNGS: Sound clear.  HEART:  S1, S2 heard.  GENERAL:  Alert and oriented x3.   Time spent on day of discharge in discharge activity:  35 minutes.   ____________________________ Leia Alf Melquisedec Journey, MD srs:lt D: 06/23/2014 12:45:18 ET T: 06/23/2014 14:12:49 ET JOB#: [id number]  cc: Alveta Heimlich R. Malana Eberwein, MD, <Dictator> Neita Carp MD ELECTRONICALLY SIGNED 07/01/2014 10:45

## 2014-12-26 NOTE — Discharge Summary (Signed)
PATIENT NAME:  [name], [name] MR#:  [id number] DATE OF BIRTH:  Jan 27, 1966  DATE OF ADMISSION:  06/27/2014 DATE OF DISCHARGE:  06/30/2014  CHIEF COMPLAINT: Altered mental status.   ADMITTING DIAGNOSES: Hepatic encephalopathy, acute respiratory failure, hyperglycemia with hyponatremia and hyperkalemia, history of depression and hypertension.   DISCHARGE DIAGNOSES:  1. Hepatic encephalopathy significantly resolved. Continue Lactulose.   2. Acute on chronic respiratory failure, discontinued BiPAP. The patient is to continue 2 liters of oxygen via nasal cannula at bedtime on a daily basis.  3. Hypomagnesemia. Continue magnesium oxide 400 mg p.o. 2 times a day and to repeat magnesium level in 5 to 7 days by primary care physician, explained.  4. Unintentional drug overdose with 60 pills of Xanax. The patient is to follow up with outpatient psychiatry and Dr. Jacqualine Code in 3 to 5 days or sooner as needed prescribing only 5 day supply of Xanax, total of 10 pills.   5. Chronic tachycardia. Continue metoprolol. The patient states that her baseline heart rate is 100 to  110.  6. Insulin-requiring diabetes mellitus, Lantus is discontinued and the patient is started on Levemir 25 units subcutaneously twice a day. The patient is to follow up with outpatient diabetic clinic as well as endocrinology as an outpatient.  7. Hypothyroidism. Continue Synthroid. The patient is on high-dose Seroquel 900 mg which will interact with the thyroid function. PCP to monitor her thyroid function tests closely preferably in 2 to 4 weeks.  8. Hypertension. Resume home medications.  9. No acute suicidal ideation. [name] Cymbalta is discontinued. Depakote was discontinued as triggering hepatic encephalopathy. The patient is to follow up with psychiatry Dr. Jacqualine Code in 2 to 3 days or sooner as needed.   PROCEDURES: None.   CONSULTATIONS: Psychiatry and inpatient diabetic clinic.   BRIEF HISTORY AND PHYSICAL: The patient is a  49 year old female who was brought into the ED with a chief complaint of altered mental status. The patient was found to be unresponsive and she was brought into the ED. Urine toxicology was negative. Ammonia level was at 85. Please review history and physical for details. The patient was admitted with the chief complaint of hepatic encephalopathy. The patient was given lactulose rectally and regarding acute respiratory failure she was placed on BiPAP and admitted to Intensive Care Unit.   HOSPITAL COURSE:  1. Her ammonia level trended down and the patient was more awake and alert by the next day. Regarding hepatic encephalopathy, lactulose was continued. Clinical situation significantly improved with continuation of the lactulose. The patient was seen by Dr. Weber Cooks of psychiatric regarding unintentional drug overdose he discontinued her Depakote and Cymbalta as it could trigger the hepatic encephalopathy frequently. He thinks the patient's recurrent hepatic encephalopathy is from the adverse affects of Depakote and Cymbalta.  2. Acute respiratory failure chronic renal chronic respiratory failure. The patient was initially placed on BiPAP and altered mental status was assumed to be from herpetic encephalopathy. That was discontinued and the patient was continued on 2 liters of oxygen via nasal cannula at bedtime. Initially, the patient was placed on Zosyn and vancomycin for possible early stages of pneumonia. Repeat chest x-ray revealed no pneumonia and antibiotics were discontinued.  3. Hyperglycemia with hyponatremia was because of the hyperosmolar nonketotic state. The patient was initially started on insulin drip, which was discontinued with significant improvement in the blood sugars. Pseudohyponatremia from hyperglycemia, which has resolved.  4. Hyperkalemia was also resolved.  5. Hypomagnesemia. The patient was given magnesium IV  supplements. Today magnesium is at 1.5. We thought of replacing with IV,  but the patient has very poor peripheral IV lines which are infiltrated.  The patient promised to take magnesium oxide by mouth twice a day. Will continue magnesium oxide 400 mg twice a day and the primary care physician is to check her magnesium level in 5 to 7 days.  6. Insulin-requiring diabetes mellitus. The patient was on Lantus at home, which was discontinued during the hospital course and she was started on Levemir 25 units subcutaneously twice a day and also to continue sliding scale insulin. The patient is to follow with outpatient diabetes clinic and also with endocrinology.  7. The patient has hypothyroidism. Plan to continue on Synthroid but as the patient is on high-dose Seroquel 900 mg interact which will interact with thyroid function the primary care physician is to monitor her thyroid functions closely and repeated thyroid test in 2 to 3 weeks or sooner as needed.  8. Chronic tachycardia. The patient states her baseline heart rate is at 100 to 110. We will continue her home medication, metoprolol.  9. Hypertension. Resume home medications.  10. Unintentional drug overdose with history of depression. The patient took 60 pills of Xanax. Initially she was monitored one-on-one observation. She was eventually seen by telepsychiatry who discontinued her sitter monitoring as it was unintentional drug overdose. Dr. Weber Cooks has seen the patient and recommended to give only 5 day supply of Xanax, otherwise, the patient might go into benzodiazepine withdrawal.  The patient was recommended to follow up with Dr. Jacqualine Code, her psychiatrist, in 3 to 5 days or sooner as needed. Depakote and Cymbalta were discontinued because of a drug interaction and adverse effects of hepatic encephalopathy. The patient is to continue Seroquel 900 mg p.o. at bedtime and Lamictal 120 mg once daily.  10. The patient was evaluated by physical therapy for deconditioning. They recommended 24-hour assistance. As the patient is not  bed bound or homebound case management could not arrange any home health. Husband promised that he will take care of it 27/7.  The patient has good family support. The patient states that she feels safe at home and denies any acute suicidal  or homicidal ideations. The patient is getting discharged under stable condition.   ACTIVITY: As tolerated.   FOLLOWUP: Follow up with primary care physician in 1 week. PCP to consider repeating magnesium level in 1 week. Psychiatry, Dr. Jacqualine Code,  in 1 to 2 days. Endocrinologist Dr. Gabriel Carina in in 1 to 2 weeks outpatient diabetic clinic in 1 week PCP  to consider repeating TSH in 2 to 3 weeks.   DIET: Low-fat, low-cholesterol, carb controlled.   ACTIVITY: As tolerated.   HOME MEDICATIONS: 1.  Magnesium oxide 400 mg 1 tablet p.o. 2 times a day. 2. Lamotrigine 125 mg (100 plus 25 mg taken at  bedtime).  3. Levothyroxine 17 micrograms p.o. once daily. 4. Gabapentin 300 mg 1 capsule 2 times a day, 2 in the morning and 2 capsules at bedtime. 5. Seroquel 900 mg once daily at bedtime. 6. Pantoprazole 40 mg p.o. once daily. 7. Prinivil 5 mg p.o. once daily. 8. Vitamin D2  50,000 international units 1 capsule 3 times a week on Monday, Wednesday and Friday. 9. Rifaximin 550 mg 1 tablet p.o. 2 times a day. 10. Sucralfate 1 gram p.o. 4 times a day. 11. Meloxicam 150 mg 1 tablet p.o. once a day with food. 12. Metoprolol 25 mg 1/2 tablet p.o. 2 times a  day. 13. Humalog sliding scale 3 times a day, and 15 units subcutaneously once a day. 14. Alprazolam 0.25 mg p.o. 2 times a day given only 5 days supply, a total of 10 pills. 15. Lactulose 30 mL p.o. 3 times a day. 16. Insulin, Levemir 20 units subcutaneously 2 times a day. 17. Discontinue the Depakote, Cymbalta and Lantus.  LABORATORY AND IMAGING STUDIES: Today magnesium is 1.5. Ammonia is 73. CBC: WBC normal. Hemoglobin 11.1, hematocrit is 34.6, platelets are normal. On the 06/29/2014 BMP glucose is 304. The rest of  the BMP is normal. Chest x-ray PA and lateral views: On the 06/29/2014 no definite acute process. CAT scan of the head without contrast: No acute intracranial abnormalities. Plan of care was discussed in detail with the patient and her husband at bedside. They both verbalized understanding of the plan.   TOTAL TIME SPENT: 45 minutes.    ____________________________ Nicholes Mango, MD ag:JT D: 06/30/2014 13:45:54 ET T: 06/30/2014 23:07:29 ET JOB#: [id number]  cc: Nicholes Mango, MD, <Dictator> Milinda Pointer. Jacqualine Code, MD Gonzella Lex, MD A. Lavone Orn, MD Enid Derry, MD  Nicholes Mango MD ELECTRONICALLY SIGNED 07/13/2014 21:50

## 2014-12-26 NOTE — H&P (Signed)
PATIENT NAME:  [name], [name] MR#:  [id number] DATE OF BIRTH:  1965-12-15  DATE OF ADMISSION:  06/27/2014  PATIENT NAME: [name].   PRIMARY DOCTOR: Enid Derry, MD  EMERGENCY ROOM PHYSICIAN: Briant Sites. Joni Fears, MD    CHIEF COMPLAINT: Altered mental status.   HISTORY OF PRESENT ILLNESS: The patient is a 49 year old female patient brought in by the husband because of altered mental status. The patient was doing fine yesterday. This morning the patient told her husband that she is not feeling well, Stacey the patient's husband noted that she was slumped over to the couch Stacey was unresponsive in the afternoon so he called the ambulance. The patient was unresponsive Stacey she received a dose of Narcan in the Emergency Room. The patient still was unresponsive so ER physician asked [place] to admit the patient. Urine toxicology was negative. Husband says that he gives her medications Stacey he gets those medicines locked so there is no question about overdosing on her psychiatric medications, but her ammonia level is 85. The patient was here recently Stacey was discharged by Dr. Darvin Neighbours . the patient was here from October 16 to 17. At that time she had hepatic encephalopathy Stacey her ammonia was 174 at that time. The patient went home Stacey she was doing fine, but suddenly noted to be more lethargic Stacey unresponsive today afternoon.  PAST MEDICAL HISTORY: Significant for depression Stacey hypertension, diabetes, hepatic encephalopathy, nonalcoholic fatty liver. The patient has bipolar disorder, hypothyroidism, COPD, Stacey obstructive sleep apnea. Recently had a gastric emptying study done a week ago which was normal.   ALLERGIES: CODEINE, MORPHINE, Stacey ALSO REMERON Stacey VICODIN.   SOCIAL HISTORY: No smoking. No drinking. Lives with husband.   FAMILY HISTORY: Mother is alive. She has diabetes Stacey she has kidney problem on dialysis. Father died of suicide.   CURRENT MEDICATIONS: She is on:  1.  Cymbalta 60 mg 2 capsules daily.   2.  Fenofibric acid 135 mg p.o. daily. 3.  Lamictal 25 mg at bedtime along with 100 mg so patient is on Lamictal 125 mg daily.  4.  Levothyroxine 75 mcg daily.  5.  Depakote 1 g p.o. b.i.d.  6.  Neurontin 300 mg 2 times a day in the morning Stacey afternoon Stacey also 300 mg 2 capsules at bedtime.  7.  Pantoprazole 40 mg daily.  8.  Lantus 30 units b.i.d.  9.  Quinapril 5 mg daily.  10.  Humalog 5 units t.i.d.  11.  Lactulose 10 g or 30 mL t.i.d.  12.  Rifaximin 550 mg b.i.d.  REVIEW OF SYSTEMS: Not obtainable secondary to her altered mental status.   PHYSICAL EXAMINATION:  VITAL SIGNS: Temperature 98.5, heart rate 123, blood pressure 112/69, oxygen saturations 88% on room air. Right now she is on 100% nonrebreather, saturations are 100%. GENERAL: The patient was unresponsive but opening the eyes to verbal commands.   HEAD: Normocephalic, atraumatic.  EYES: Pupils equal, reacting to light. No conjunctival pallor. No icterus.  NOSE: No nasal lesions. No drainage.  EARS: No drainage or external lesions.  MOUTH: No lesions, no exudates.  NECK: Supple. No JVD. No carotid bruit. Normal range of motion.  RESPIRATORY: Good respirations. Patient is using accessory muscles of respiration. Decreased breath sounds at the bases. CARDIOVASCULAR: S1, S2 regular, tachycardic.  GASTROINTESTINAL: PMI not displaced. No peripheral edema.  MUSCULOSKELETAL: Normal. Patient has no effusion or tenderness.  SKIN: Inspection is normal. Well hydrated.  NEUROLOGIC: Unable to do further exam secondary to  altered mental status.  PSYCHIATRIC: Not done because of altered mental status.   LABORATORY DATA: ABG: pH 7.30, pCO2 of 54, pO2 of 225. [name] ABG is done on 100% nonrebreather. Ammonia 85. Head CAT scan shows no acute intracranial pathology.  [name] lactic acid 2.1, blood glucose 441. WBC 6.2, hemoglobin 13.3, hematocrit 40.9, platelets 273,000. Electrolytes: Sodium is 129, potassium 5.9, chloride  91, bicarbonate 25, BUN 26, creatinine 0.97. [name] anion gap of 13, glucose 497. Troponin less than 0.02. Patient's urine toxicology is positive for benzodiazepines Stacey UA is clear, no infection.   ASSESSMENT Stacey PLAN:  The patient is a 49 year old female with altered mental status, likely a combination of hepatic encephalopathy Stacey also hyperglycemia with hyponatremia, hypokalemia, Stacey mild respiratory acidosis secondary to possibly early viral or fungal pneumonia. 1.  Hepatic encephalopathy. The patient will get lactulose enemas today Stacey then probably tomorrow she can get p.o. lactulose Stacey check ammonia level in the morning. Patient was very unresponsive when she came, but she started to wake up Stacey hopefully she should be okay by getting 1 or 2 doses of lactulose enema Stacey then that can be changed to p.o.  2.  Respiratory failure, likely secondary to altered mental status from hepatic encephalopathy Stacey also mild early pneumonia. The patient will be on BiPAP Stacey will recheck the blood gas in 2 hours. 3.  Hyperglycemia with hyponatremia Stacey hyperkalemia. The patient will get intravenous fluids Stacey insulin drip for possibly hyperosmolar nonketotic state. The patient will have a repeat BMP again in 6 hours. Hyperkalemia, hopefully insulin drip will take care of that.  4.  History of depression Stacey hypertension. Hold all the p.o. stuff today Stacey resume it in the morning.   TIME SPENT: About 60 minutes.    ____________________________ Epifanio Lesches, MD sk:ST D: 06/27/2014 19:55:33 ET T: 06/27/2014 21:32:45 ET JOB#: [id number]  cc: Epifanio Lesches, MD, <Dictator> Epifanio Lesches MD ELECTRONICALLY SIGNED 08/03/2014 11:26

## 2014-12-26 NOTE — Discharge Summary (Signed)
PATIENT NAME:  [name], BINNING MR#:  [id number] DATE OF BIRTH:  [birth year]  DATE OF ADMISSION:  06/10/2014 DATE OF DISCHARGE:  06/12/2014  DISCHARGE DIAGNOSES:  1.  Urinary tract infection. 2.  Uncontrolled diabetes with hemoglobin A1c greater than 9.5. Counseled 3.  Elevated ammonia/hepatic encephalopathy present on admission.  Normalized with lactulose.   SECONDARY DIAGNOSES:  1.  Fatty liver disease.  2.  Hypertension.  3.  Type 2 Diabetes.  4.  Bipolar disorder.  5.  Obstructive sleep apnea.  6.  Suspected obstructive sleep apnea.  7.  Obesity.  8.  Chronic obstructive pulmonary disease.  9.  Coronary artery disease.  10.  Hypothyroidism.   CONSULTATIONS: Palliative care.   PROCEDURES AND RADIOLOGY: None.   LABORATORY DATA: Urinalysis on admission showed trace bacteria, 85 WBCs, 1+ leukocyte esterase.   A urine culture grew 80,000 colonies of Enterococcus faecalis.   HISTORY AND SHORT HOSPITAL COURSE: The patient is a 49 year old female with the above-mentioned medical problems, who was admitted. She was readmitted for urinary tract infection. She was started on IV antibiotics. Please see Dr. Penelope Galas dictated history and physical for further details. The patient was slowly improving. The biggest issue with her was recurrent admission for which palliative care consultation along with care management consultation was obtained to look into the psychosocial situation as to why she keeps coming back to the hospital. The patient was given an application for [facility] through Medicaid to see if she can get some additional resources. The patient was feeling much better, was close to her baseline, and was discharged home in stable condition on 06/12/2014.  On the date of discharge, her vital signs were as follows: Temperature 98.3, heart rate 71 per minute, respirations 17 per minute, blood pressure 131/79. She was saturating 95% on room air.   PERTINENT PHYSICAL EXAMINATION ON THE  DATE OF DISCHARGE:  CARDIOVASCULAR: S1, S2 normal. No murmurs, rubs, or gallop.  LUNGS: Clear to auscultation bilaterally. No wheezing, rales, rhonchi, or crepitation.  ABDOMEN: Soft, benign.  NEUROLOGIC: Nonfocal examination.  All other physical examination remained at baseline.   DISCHARGE MEDICATIONS:  Medication Instructions  duloxetine 60 mg oral delayed release capsule  2 cap(s) orally once a day   fenofibric acid 135 mg oral delayed release capsule  1 cap(s) orally once a day   lamotrigine 25 mg oral tablet  1 tab(s) orally once a day (at bedtime) along with lamotrigine 100 mg   levothyroxine 75 mcg (0.075 mg) oral tablet  1 tab(s) orally once a day   lamotrigine 100 mg oral tablet  1 tab(s) orally once a day (at bedtime) along with lamotrigine 25 mg   divalproex sodium 500 mg oral tablet, extended release  2 tab(s) orally 2 times a day   gabapentin 300 mg oral capsule  2 cap(s) orally once a day (at bedtime)   quetiapine 300 mg oral tablet  3 tab(s) orally once a day (at bedtime)   pantoprazole 40 mg oral delayed release tablet  1 tab(s) orally once a day   linzess 290 mcg oral capsule  1 cap(s) orally once a day, As Needed   lantus 100 units/ml subcutaneous solution  30 unit(s) subcutaneous 2 times a day decreased dose   humalog 100 units/ml subcutaneous solution  5  subcutaneous 3 times a day (before meals)   quinapril 5 mg oral tablet  1 tab(s) orally once a day   gabapentin 300 mg oral capsule  1 cap(s) orally 2  times a day (in the morning and afternoon)   cephalexin 250 mg oral capsule  1 cap(s) orally 4 times a day x 3 days   constulose 10 g/15 ml oral syrup  30 milliliter(s) orally once a day     DISCHARGE DIET: Low sodium, low fat, low cholesterol, 1800 ADA.   DISCHARGE ACTIVITY: As tolerated.   DISCHARGE INSTRUCTIONS AND FOLLOWUP: The patient was instructed to follow up with her primary care physician, Dr. Enid Derry, in 1-2 weeks. She will need follow-up with Dr.  Lillia Pauls B. Paul in 2-4 weeks.   TOTAL TIME DISCHARGING THIS PATIENT: 40 minutes.    ____________________________ Lucina Mellow. Manuella Ghazi, MD vss:MT D: 06/15/2014 09:36:26 ET T: 06/15/2014 09:49:55 ET JOB#: [id number]  cc: Deniz Eskridge S. Manuella Ghazi, MD, <Dictator> Enid Derry, MD Bhakti B. Eddie Dibbles, MD Efraim Kaufmann, MD Lucina Mellow [facility] MD ELECTRONICALLY SIGNED 06/15/2014 10:32

## 2014-12-26 NOTE — Discharge Summary (Signed)
PATIENT NAME:  [name], [name] MR#:  [id number] DATE OF BIRTH:  1965/10/20  DATE OF ADMISSION:  05/27/2014 DATE OF DISCHARGE:  05/28/2014  ADMITTING DIAGNOSES: Dizziness, weakness, hypotension, a urinary tract infection.   DISCHARGE DIAGNOSES:  1. Dizziness, suspected  hypoxia on  exertion, hypotension related.   2. Hypotension.  3. Likely dehydration.  4. Acute renal failure, resolved on intravenous fluids. 5. Pyuria, no urinary tract infection.  6. Hypoxia on exertion.  7. Fatty liver disease with hypoalbuminemia.   8. History of hypertension. 9. Diabetes mellitus. 10. Bipolar disorder.   11. Obesity.   12. Obstructive sleep apnea, not on continuous positive airway pressure due to inability to fit mask per patient.  13. Hypothyroidism. 14. Chronic obstructive pulmonary disease.  15. Fatty liver disease.   DISCHARGE CONDITION: Stable. The patient was advised to have pulmonary rehabilitation referral when she is done with home health services.   MEDICATIONS: List is as follows:   1. Duloxetine 60 mg 2 capsules once daily. 2. Fenofibric acid 135 mg once daily. 3. Lamotrigine 25 mg p.o. together with 100 mg of lamotrigine daily, total of 125 mg daily.  4. Levothyroxine 75 mcg p.o. daily.  5. Alprazolam 0.25 mg p.o. twice daily.  6. Divalproex sodium 500 mg 2 tablets twice daily. 7. Gabapentin 300 mg 2 capsules once at bedtime. 8. Sucralfate 1 gram 4 times daily. 9.  Seroquel 300 mg p.o. at bedtime.  10. Pantoprazole 40 mg p.o. daily. 11. Metoprolol tartrate 12.5 mg p.o. twice daily. 12. Linzess 290 mcg p.o. daily as needed. 13. Dicyclomine 10 mg 2 capsules every 8 hours as needed. 14. Tramadol 50 mg p.o. every 4 hours as needed.  15. Lantus 30 units subcutaneously twice daily. 16. Humalog 5 units subcutaneously 3 times daily before meals. 17. Quinapril 5 mg p.o. daily. 18. Vitamin D2 50,000 units 3 times weekly Mondays, Wednesdays, Fridays. 19. Gabapentin 300 mg p.o. twice  daily. 20. Lactulose 30 mL twice daily as needed.   Home health services will be prescribed for her upon discharge. Physical therapy as well as nurse, home oxygen with portable tank at 2 liters of oxygen through nasal cannula continuously and nighttime as well.   DIET: Two grams salt, low-fat, low-cholesterol, carbohydrate-controlled diet, regular consistency.   ACTIVITY LIMITATIONS: As tolerated.  FOLLOW-UP: Follow-up appointment with [name] in 2 days after discharge.   CONSULTANTS: Care management, social work.   RADIOLOGIC STUDIES: Chest x-ray, portable, single view, on 05/27/2014 showed no edema or consolidation. CT scan of head without contrast, the 05/27/2014 revealed generalized atrophy, minimal sinus disease changes, no acute intracranial abnormalities.  Ultrasound of kidneys 05/28/2014, revealed normal renal ultrasound.   HOSPITAL COURSE: The patient is a 49 year old Caucasian female with past medical history significant for multiple medical problems including fatty liver disease, history of bipolar disorder, history of obstructive sleep apnea, chronic respiratory failure, on oxygen at home at night, who presents to the hospital with complaints of dizziness, weakness, hypotension, and questionable UTI. Please refer to Dr. Chana Bode Patel's admission note on the  05/27/2014.   On arrival to the hospital, the patient's temperature was 98.3, pulse was 85, respiration rate was 20, blood pressure 84/48, saturation was not measured. [name] physical exam was unremarkable. [name] lab data done on admission revealed elevated BUN and creatinine to 26 and 1.61, glucose 130; otherwise, BMP was unremarkable. [name] lipase level was 234. Ammonia level was elevated at 43. Albumin level was 2.8, otherwise, liver enzymes were  unremarkable. Cardiac enzymes were unremarkable. TSH was normal at 1.65, valproic acid level was 78. White blood cell count was normal at 6.5, hemoglobin was  10.9, platelet count was 222. Blood cultures taken on the 05/27/2014 showed no growth. Urinalysis showed also no growth although the patient's  urine looked cloudy, yellow, and had trace ketones, was positive for 2+ leukocyte esterase, 3 red blood cells, 23 white blood cells were noted. EKG revealed normal sinus rhythm at 85 beats per minute, nonspecific T wave abnormality. Chest x-ray was unremarkable as well as CT of the head.   The patient was admitted to the hospital for further evaluation. She was started on IV antibiotics because of concern of UTI as well as IV fluids. She was rehydrated and her condition improved. She had D-dimer checked which was normal. Her kidney function normalized after IV fluid administration and it was 1.24 on the 05/28/2014. She was felt to have had dizziness due to hypertension as well as possibly hypoxia since she was noted to be hypoxic to 85% on room air on exertion. She was rehydrated and her blood pressure improved, so dizziness was felt to be of lesser concern. The patient did have orthostatic vital signs checked while she was in the hospital and orthostatics were remarkable for  her blood pressure dipping down from 124 to 107 from sitting to standing position, but later rechecked blood pressure remained stable at around 120. Reasons for hypoxia were unclear, however, chronic respiratory failure was implicated. D-dimer was unremarkable. However, it was felt that patient would benefit from oxygen at home, especially on exertion, and portable oxygen tank was prescribed for her upon discharge as well as physical therapy at home. The patient will have physical therapy as well as RN coming to her house and following her blood pressure readings, especially orthostatic vital signs and oxygenation as well as her overall condition to ensure that the major factors contributing to dizziness are addressed.   In regards to dehydration it was unclear why she had an episode of  dehydration. [name] intake while in the hospital was followed and she was able to eat 100% of offered meals. It is recommended to follow the patient's blood pressure readings, orthostatic vital signs as well as kidney function test as outpatient. It could have been that patient was somewhat mentally declining due to hypoalbuminemia and that could have contributed to poor p.o. intake as well as some dehydration, at least that was the postulation upon her dehydration issues. Whatever it is, her acute renal failure resolved on IV fluids. Initially, the patient was felt to have urinary tract infection due to pyuria; however, the patient's urine cultures did not show any growth so antibiotic was discontinued.   In regards to hypoalbuminemia, the patient was initiated on lactulose. For her chronic medical problems such as hypertension, diabetes, bipolar disorder, obstructive sleep apnea, hypothyroidism, COPD, and other medical issues, the patient is to continue her outpatient medications. No changes were made. On the day of discharge, the patient felt satisfactory, did not complain of any significant discomfort. Her vitals were stable with temperature of 98.3, pulse was 96, respiration rate was 20, blood pressure 118/78, saturation was 90% on room air at rest, while she was laying flat up to 93 to 94%. Again, we suspect that patient's 90% on room air at rest oxygenation was probably because of her being supine due to underlying obstructive sleep apnea.   TIME SPENT: 40 minutes.    ____________________________ Theodoro Grist, MD  rv:JT D: 05/28/2014 17:59:11 ET T: 05/28/2014 22:53:43 ET JOB#: [id number]  cc: Theodoro Grist, MD, <Dictator> Enid Derry, MD   [place] MD ELECTRONICALLY SIGNED 06/11/2014 16:54

## 2014-12-27 NOTE — H&P (Signed)
PATIENT NAME:  [name], [name] MR#:  [id number] DATE OF BIRTH:  [birth year]  DATE OF ADMISSION:  09/18/2011  ADDENDUM   [name] blood pressure has dropped again to 81 systolic. I will start her on Levophed drip and also will get a CT of her head without contrast because she fell three times and questionable syncope.  ____________________________ Mena Pauls, MD ag:ap D: 09/18/2011 13:17:04 ET T: 09/18/2011 14:52:26 ET JOB#: [id number]  cc: Mena Pauls, MD, <Dictator>  Mena Pauls MD ELECTRONICALLY SIGNED 09/29/2011 11:05

## 2014-12-27 NOTE — H&P (Signed)
PATIENT NAME:  [name], HERFORD MR#:  [id number] DATE OF BIRTH:  [birth year]  DATE OF ADMISSION:  09/18/2011  PRIMARY CARE PHYSICIAN: Dr. Heber [place].   CHIEF COMPLAINT: I fell three times today. I was dizzy and weak.   HISTORY OF PRESENT ILLNESS: A 49 year old female who has history of chronic obstructive pulmonary disease, Does not use home oxygen at home, obstructive sleep apnea, not on CPAP, diabetes, insulin-dependent, hypertension, hyperlipidemia, bipolar disorder, diabetic neuropathy and hypothyroidism. She presented to the Emergency Room because she fell three times yesterday. She felt she was dizzy and she was weak. She said she fell into the shower yesterday. It took her approximately 20 minutes to get up and then she fell again two times last night. Her husband was recently admitted to the hospital and she came to see her husband and she was feeling extremely dizzy. Also, she was complaining of cough going on for a week. She denies any expectoration with that. She denies any fever or chills or any chest pain. She said she was nauseous, but no abdominal pain. No diarrhea. No urinary complaints. When she came to the Emergency Room, she was tachycardic with a heart rate 114 and blood pressure was 70/41. She got about two liters of normal saline bolus in the Emergency Room and her blood pressure remained 79 systolic. She is getting a third liter of normal saline bolus and her blood pressure has improved to 92/41 with fluid. Her chest x-ray shows that she has a right upper lobe pneumonia. She was also found to be in acute renal failure with a creatinine of 2.1 and a BUN of 29. She is being admitted for right upper lobe pneumonia with sepsis with acute renal failure. She denies any headache right now. She says she has small bumps on the head because of the fall she had. She had questionable loss of consciousness. She does not remember. No one was at home at that time because her husband was in the hospital.    REVIEW OF SYSTEMS: Positive for weakness and dizziness but no fever. No acute change in vision. Complaining of dizziness, but no ear pain. No epistaxis. She is complaining of cough. She is complaining of wheezing also, but no painful respiration. No dyspnea. She is complaining of cough. No chest pain. No palpitations. She is complaining of nausea, but no vomiting, diarrhea, abdominal pain. No gastrointestinal bleed. No dysuria. No frequency. She has thyroid problems. No anemia. No bleeding from any site. No rash. No joint pains. No swelling. No focal numbness. She has neuropathy. She has bipolar disorder.   PAST MEDICAL HISTORY:    1. Diabetes, insulin-dependent. 2. Hypertension. 3. Hyperlipidemia. 4. Hypothyroidism. 5. Bipolar disorder. 6. Diabetic neuropathy. 7. Obstructive sleep apnea, not on CPAP. 8. Chronic obstructive pulmonary disease, not on home oxygen.  9. She was last admitted on 08/24 until 05/01/2011 with acute on chronic respiratory failure, chronic obstructive pulmonary disease exacerbation and systemic inflammatory response syndrome and bronchitis.   PAST SURGICAL HISTORY:  1. Cholecystectomy. 2. Tubal ligation.  3. Bladder box stimulator.   ALLERGIES TO MEDICATIONS: Codeine, morphine, Remeron and Vicodin. She says morphine causes anaphylactic reaction.   HOME MEDICATIONS: Reviewed with the patient:  1. Combivent 2 puffs 3 times a day as needed.  2. Cymbalta 120 mg daily. 3. Depakote 1,000 mg twice a day. 4. Gabapentin 300 mg b.i.d.  5. Ibuprofen 800 mg as needed.  6. Lantus 55 units twice a day. 7. Levothyroxine 50 mcg daily.  8. Metformin 1,000 mg b.i.d.  9. NovoLog sliding scale. 10. Quetiapine 300 mg 3 tablets at bedtime, 900 mg at bedtime.  11. Quinapril 10 mg daily.  12. Simvastatin 20 mg daily.  13. Tramadol 50 mg every 4 to 6 hours as needed for pain.  14. Xanax 0.5 mg at bedtime.   SOCIAL HISTORY: She lives with her husband. She said she never smoked  in the past. No alcohol use. No drug use. Her husband is a smoker.   FAMILY HISTORY: Mother had diabetes. She is on hemodialysis right now. [name] father died of suicide according to previous record.   PHYSICAL EXAMINATION:  VITAL SIGNS: When she presented to the Emergency Room, temperature 98.2, heart rate 114, respiratory rate 22, blood pressure 70/41, saturating 94% on room air. Her current vitals include heart rate of 92, blood pressure 92/41, saturating 95% on 4 liters nasal cannula with a respiratory rate of 29.  GENERAL: This is a young obese Caucasian female, lying supine in bed. She appears to be lethargic, slightly toxic in appearance.   HEENT: Bilateral pupils are equal. Extraocular muscles intact. No scleral icterus. No conjunctivitis. Oral mucosa is moist. No pallor.   NECK: No thyroid tenderness, enlargement or nodules. Neck is supple. No masses, nontender. No adenopathy. No JVD. No carotid bruit.   CHEST: She has decreased breath sounds on the right side posteriorly. She also has some expiratory wheezing, but normal respiratory effort. Not using accessory muscles of respiration.   HEART: Heart sounds are regular. No murmur. Good peripheral pulses. No lower extremity edema.   ABDOMEN: Soft, nontender. Normal bowel sounds. No hepatosplenomegaly. No bruit. No masses.   RECTAL: Deferred.   NEUROLOGIC: She is awake, alert. She is slightly lethargic. Oriented to time, place, and person. She is moving all extremities against gravity.   EXTREMITIES: No cyanosis or clubbing.   SKIN: She has a right triple-lumen catheter placed in the right femoral area.   EXTREMITIES: No cyanosis or clubbing.   SKIN: No rash. No lesions.    IMPRESSION:  1. Right upper lobe pneumonia with sepsis. 2. Acute renal failure, most likely prerenal. 3. Hypoxia. 4. Diabetes, insulin-dependent. 5. Hypotension. 6. Hyperlipidemia. 7. Bipolar disorder.  8. Hypothyroidism.  9. Diabetic  neuropathy. 10. Chronic obstructive pulmonary disease exacerbation secondary to pneumonia and obstructive sleep apnea.   PLAN: A 49 year old female with history of diabetes, hypertension, hyperlipidemia, chronic obstructive pulmonary disease and obstructive sleep apnea. She presents with extreme dizziness. She fell three times yesterday. She was found to be hypotensive with a blood pressure of 70 systolic. She was found to be in acute renal failure with a creatinine of 2.1 and BUN of 29. Her last creatinine in November 2012 are normal at 0.85. She is getting a third liter of normal saline bolus and her blood pressure has come up to 92/41. She got a central line placed in the Emergency Room. I am going to give her aggressive IV hydration. I am going to hold her nephrotoxics at this time. We will hold her ACE inhibitor. We will hold her metformin. She has already been started on vancomycin and Zosyn. Blood cultures have been drawn. Will send a sputum culture on her. Her urinalysis is cloudy, but nitrite and leukocyte esterase are negative. WBC are only 8. Urine cultures have been sent. We will also add Zithromax to the regimen to cover atypicals. The Levophed has been ordered by the ER physician, but her blood pressure has come up with  fluids and she most likely would not require Levophed. Her ABG shows pH 7.37, pCO2 39 and pO2 70 on 28% FiO2 with lactate of 1.8. High lactic most likely secondary to renal failure. I will give her full liquid diet, diabetic right now. I am going to cut down the Lantus right now because she may not have a good p.o. intake at this time. I am also going to give her IV Solu-Medrol. Will give her DuoNebs. I will continue her regimen for bipolar disorder. Her EKG shows that she has a sinus rhythm with left axis deviation, nonspecific T wave changes. Will do serial cardiac enzymes on her. She probably should get a repeat chest x-ray after the completion of antibiotic to see any resolution.  The last chest x-ray in November 2012 does not have any consolidation on the right side. This is most likely pneumonia then.     CRITICAL CARE TIME SPENT: Around 55 minutes. Plan was discussed with the patient and the family in detail.   ____________________________ Mena Pauls, MD ag:ap D: 09/18/2011 13:14:05 ET T: 09/18/2011 15:25:34 ET JOB#: [id number]  cc: Mena Pauls, MD, <Dictator> Maureen P. Heber [place], MD Mena Pauls MD ELECTRONICALLY SIGNED 09/29/2011 11:06

## 2014-12-27 NOTE — Discharge Summary (Signed)
PATIENT NAME:  [name], [name] MR#:  [id number] DATE OF BIRTH:  03-21-1966  DATE OF ADMISSION:  09/18/2011 DATE OF DISCHARGE:  09/22/2011  ADMITTING DIAGNOSIS: Acute respiratory failure.  DISCHARGE DIAGNOSES: 1. Systemic inflammatory response reaction.  2. Bacterial pneumonia.  3. Acute respiratory failure due to bacterial pneumonia.   4. Chronic obstructive pulmonary disease exacerbation.  5. Septic shock, resolved.  6. Acute renal failure, resolved.  7. Hypernatremia.  8. Anemia.  9. History of diabetes mellitus.  10. Hypertension. 11. Hyperlipidemia. 12. Hypothyroidism. 13. History of bipolar disorder, stable.   DISCHARGE CONDITION: Stable.   DISCHARGE MEDICATIONS: The patient is to resume her outpatient medications which are: 1. Tramadol 50 milligrams every 4 to 6 hours as needed.  2. Levothyroxine 50 mcg p.o. daily.  3. NovoLog sliding scale. 4. Simvastatin 75 milligrams p.o. daily.  5. Metformin 1 gram twice daily.  6. Levaquin 750 milligrams p.o. daily for five more days.  7. Prednisone 50 once on 09/23/2011, then taper x10 mg every other day.  8. Neurontin 300 milligrams p.o. twice daily. 9. Symbicort 160/4.5, two puffs twice daily.  10. Humibid 2,000 milligrams p.o. twice daily.   11. Robitussin 15 mL every four hours as needed.  12. Zantac 150 milligrams p.o. twice daily.  13. Quetiapine, which is Seroquel, 300 milligrams p.o. 3 tablets, which would be 900 milligrams, p.o. at bedtime.  14. Quinapril 10 milligrams p.o. daily.  15. Combivent 2 puffs three times daily as needed.  16. Lantus insulin 55 units twice daily.  17. Gabapentin 300 milligrams twice daily. 18. Depakote ER 1,000 milligrams twice daily.  19. Cymbalta 120 mg p.o. daily.  20. Xanax 0.5 mg p.o. at bedtime.   DIET: 2 grams salt, 1,800 ADA, low fat, low cholesterol.   PHYSICAL ACTIVITY LIMITATIONS: As tolerated.   FOLLOWUP: Follow-up appointment with Dr. Heber [place] in two days after discharge.  The patient is given flu vaccine upon discharge.   CONSULTANT: Care management   RADIOLOGIC STUDIES: Chest, portable, single view, 09/18/2011: Right upper lobe pneumonia. Recommend follow-up radiography to document complete resolution following adequate medical therapy. If there is no complete resolution, then recommend further evaluation with CT to exclude underlying pathology. CT of head without contrast 09/18/2011: No acute intracranial process. Repeated chest x-ray 09/22/2011 PA and lateral showed right upper lobe pneumonia with improved interval aeration. Recommend follow-up radiography in 4 to 6 weeks to document complete resolution following adequate medical therapy. If there is not complete resolution, then recommend further evaluation with CT of the chest to exclude underlying pathology according to the radiologist.   HISTORY: The patient is a 49 year old Caucasian female who presented to the hospital on 09/18/2011 with complaints of falls, dizziness and feeling weak. Please refer to Dr. Steve Rattler admission note on 01/14. Apparently, the patient was having weakness as well as dizziness, but no fevers. She had no other symptomatology. Her temperature was 98.2 on presentation, heart rate 114, respiration rate 22, blood pressure 70/40, saturation was 94% on oxygen therapy. She had decreased breath sounds on the right side posteriorly. She also had some expiratory wheezing. Otherwise unremarkable physical exam except the patient was somewhat lethargic.   LABORATORY DATA: 0n 09/18/2011 showed glucose 223, BUN 29, creatinine 2.1. Otherwise unremarkable BMP. [name] estimated GFR was low at 27 Her liver enzymes were unremarkable except albumin level of 2.9. Cardiac enzymes, first set, as well as subsequent two more sets, were within normal limits. Valproic acid level was therapeutic at 68. White blood cell  count was normal at 6.1, hemoglobin 10.9, platelets 166. Blood cultures taken on 09/18/2011  showed no growth. Urinalysis revealed glucosuria, 30 mg/dl protein, also 8 white blood cells. However, urine cultures were negative.   HOSPITAL COURSE: The patient was admitted to the hospital. She was started on high rate IV fluids and was started on antibiotics with Zosyn as well as Zithromax IV. With this conservative therapy she improved. Initially, her oxygen requirements were high. Initially, she required BiPAP as well as pressor support for her hypotension. It was felt that the patient had systemic inflammatory response reaction due to pneumonia. She was continued on steroids and DuoNebs and her blood pressure medications were placed on hold as well as metformin due to acute renal failure. With this therapy she improved. Her oxygen saturation also improved as time progressed. However, she still required oxygen on exertion. Her oxygen saturation was 97% on 3 liters of oxygen on exertion, but apparently it would drop down intermittently as low as 80s on room air at rest. It was felt that the patient would benefit from therapy with oxygen at least for a short period of time to finish her therapy of antibiotics. In fact, while in the hospital she finished Zithromax. She received a five day therapy of Zosyn because of incomplete resolution of her lung disease and unfortunate inability to base findings on sputum cultures which were not available, not received. It was felt that Levaquin would be appropriate therapy for this patient. The patient is to continue antibiotic therapy for five more days with high doses of Levaquin. She is to have x-ray in the next four weeks after discharge to document complete resolution of her right upper lobe pneumonia. Because of acute onset and hypotension, septic picture, decision was made not to look for other infection such as tuberculosis investigation. The patient is to follow-up with her primary care physician for further recommendations and to get x-ray in the near future.  Her blood pressure also improved as well as her kidney function. Her blood pressure normalized. She was returned back to her usual blood pressure medications. Her kidney function also normalized already on 09/19/2011 and remained stable all her stay in the hospital time.   The patient was noted to have episodes of hypernatremia. However, that resolved with changing her IV fluids. She was also noted to have slight anemia with rehydration. Initially, her hemoglobin level was 10.9. It remained about the same, 10.1, on 09/21/2011. It dropped down to 9.4 with rehydration. However, stopping IV fluids, it returned back to 10.1 level which was her baseline   In regards to the patient's diabetes, initially while in the hospital she required sliding scale insulin due to steroids. She also was advised to continue sliding scale insulin while outside of the hospital. Initially, her metformin was stopped because of her acute renal failure. However, later it was restarted. The patient is to continue her outpatient medications and sliding scale insulin.   For hypertension, hyperlipidemia, hypothyroidism, as well as bipolar disorder, no changes were made.   The patient is being discharged in stable condition with the above-mentioned medications and follow-up. She will need portable tank for oxygen delivery. She will be getting three liters of oxygen through nasal cannula continuously. She is to follow up with her primary care physician for further recommendations. She is to continue prednisone taper as well as antibiotic therapy for five more days. She is to have her repeated chest x-ray done in approximately four weeks after discharge to  document complete resolution.   Her vital signs on the day of discharge: Temperature 98.1, pulse 53, respiratory rate 18, blood pressure 125/75, saturation was 97% on 2 liters of oxygen through nasal cannula on exertion.   TIME SPENT: 40 minutes.   ____________________________ Theodoro Grist, MD rv:ap D: 09/22/2011 20:40:15 ET T: 09/23/2011 13:29:29 ET JOB#: [id number]  cc: Theodoro Grist, MD, <Dictator> Maureen P. Heber [place], MD Theodoro Grist MD ELECTRONICALLY SIGNED 10/02/2011 7:49

## 2014-12-28 LAB — SURGICAL PATHOLOGY

## 2015-01-03 NOTE — Consult Note (Signed)
PATIENT NAME:  [name], WORTHLEY MR#:  [id number] DATE OF BIRTH:  1966-07-01  DATE OF CONSULTATION:  11/20/2014  REFERRING PHYSICIAN:   CONSULTING PHYSICIAN:  Gonzella Lex, MD  IDENTIFYING INFORMATION AND REASON FOR CONSULTATION: This is a 49 year old woman with chronic mood instability and a diagnosis of bipolar disorder who came to the Emergency Room initially for physical complaints, but then reported suicidal ideation.   CHIEF COMPLAINT: "My mood has been unstable."   HISTORY OF PRESENT ILLNESS: Information from the patient and the chart. The patient states that for the last week or 2 her mood has been feeling much worse. She is having crying spells frequently. She is having temper outbursts where she throws things at her family. She has been sleeping poorly. Feeling sad much of the time, feeling hopeless. Has started having thoughts about killing herself in the last couple days, but has not acted out on it. She says she has been compliant with her current medicine, but she does not feel like it is working well. She was taken off of Depakote recently because of liver damage, but since then feels like she has been much less stable than previously. She also reports stress from her mother being at home with them and from her husband's illness and from conflict with her own daughters. She is not having any hallucinations. Not having homicidal ideation.   PAST PSYCHIATRIC HISTORY: The patient has had psychiatric hospitalizations in the past, most recent one here was 2014. I have seen her since then as a consult. The patient most recently has been getting medical follow-up for her psychiatric illness at [facility] seeing Dr. Jacqualine Code. She does have a history of suicide attempts in the past.   SOCIAL HISTORY: Lives at home with her husband who has chronic seizure disorder. Also has her mother living at home who is ill and that is stressful. The patient had been caring for her grandchildren but her daughters are  discontinuing that and the patient is actually upset about not being around her grandchildren anymore.   PAST MEDICAL HISTORY: The patient has multiple medical problems including chronic fatty liver, diabetes, hypertension, hypothyroidism, dyslipidemia. Recently she was taken off of Depakote because of persistent fatty liver and elevated ammonia. Since being off the Depakote that problem seems to be getting better although she still runs high ammonias at times. She is also still maintained however on Cymbalta. Her blood sugars have been completely out of control at home, running in the 400s. She says she has a new endocrinologist in [place] who is trying to get them under better control.   FAMILY HISTORY: Multiple family members with mental illness including her father being bipolar and her mother being depressed, she has a daughter with depression.   SUBSTANCE ABUSE HISTORY: Not abusing alcohol or drugs. Denies any past abuse of alcohol or drugs.   CURRENT MEDICATIONS: Medicine reconciliation seems to be probably accurate. She is taking Xanax 0.25 mg twice a day, gabapentin 600 mg at night, sliding scale of insulin, Lamictal 125 mg at bedtime, tramadol 50 mg q. 4 hours as needed for pain, Cymbalta 120 mg daily, vitamin D 50,000 units twice a week, Novolin 70/30, 75 mg twice a day and 50 mg at midday, lactulose 15 mL a day, levothyroxine 75 mcg a day, Lopressor 12.5 mg twice a day, pantoprazole 40 mg in the morning, p.r.n. Phenergan, quetiapine 900 mg at night, quinapril 5 mg daily.   ALLERGIES: CODEINE, MORPHINE, REMERON, VICODIN.   REVIEW  OF SYSTEMS: Depressed mood. Irritable mood. Poor sleep. Achy all over. Tired. Lack of interest. Otherwise no specific physical or mental health complaints.   MENTAL STATUS EXAMINATION: Disheveled woman, looks older than her stated age. Cooperative with the interview. Eye contact good. Psychomotor activity sluggish. Speech is slow, but easy to understand. Affect  dysphoric. Mood stated as depressed. Thoughts are lucid. No obvious delusions. Denies hallucinations. Endorses suicidal ideation without specific intent. No homicidal ideation. She is alert and oriented x 4. Remembers 3 words immediately, but only 1 out of 3 at 3 minutes. Judgment and insight have been somewhat impaired. Intelligence normal.   LABORATORY RESULTS: Her TSH is normal. Alcohol level normal. Ammonia level yesterday was a little bit elevated at 40. Drug screen positive for tricyclics and benzodiazepines. Lipase normal. Chemistry panel, elevated AST just at 57, normal protein and albumin, elevated calcium 10.4, low chloride 97, glucose was 191 at last check. CBC shows elevated white count at 16.2. Urinalysis positive for glucose, hazy, 3 + bacteria, negative leukocyte esterase. CAT scan of the abdomen shows fatty liver.   VITAL SIGNS: Blood pressure 138/87, respirations 18, pulse 109, temperature 97.7.  ASSESSMENT: A 49 year old woman with bipolar disorder who is currently depressed, suicidal, agitated, confused, not taking care of herself. Warrants hospitalization for safety and stabilization.   TREATMENT PLAN: Admit to psychiatry. Suicide precautions and fall precautions in place. Continue current medicines. Request consult from hospitalist to assist with medical management. Psychoeducation completed with the patient who agrees to the plan.   DIAGNOSIS PRINCIPAL AND PRIMARY:   AXIS I: Bipolar disorder type 2, depressed.   SECONDARY DIAGNOSES:   AXIS I: No further diagnosis.   AXIS II: Deferred.   AXIS III:  1.  Diabetes.  2.  High blood pressure.  3.  Dyslipidemia.  4.  Thyroid disease.     ____________________________ Gonzella Lex, MD jtc:bu D: 11/20/2014 12:56:21 ET T: 11/20/2014 13:22:44 ET JOB#: [id number]  cc: Gonzella Lex, MD, <Dictator> Gonzella Lex MD ELECTRONICALLY SIGNED 12/07/2014 9:57

## 2015-01-03 NOTE — H&P (Signed)
PATIENT NAME:  [name], [name] MR#:  [id number] DATE OF BIRTH:  April 24, 1966  DATE OF ADMISSION:  09/14/2014  REFERRING PHYSICIAN: Sheryl L. Benjaman Lobe, MD PRIMARY CARE PHYSICIAN: Enid Derry, MD  ADMITTING DOCTOR: Juluis Mire, MD   CHIEF COMPLAINT:  1. Altered sensorium of 1 day's duration.  2. Bilateral ear infections seen by primary care practitioner. The patient is on oral antibiotics.   HISTORY OF PRESENT ILLNESS: A 49 year old Caucasian female with a history of multiple medical problems including a history of hepatic encephalopathy, fatty liver, hypertension, hyperlipidemia, diabetes mellitus type 2, bipolar disorder, restless leg syndrome, history of MI/coronary artery disease, COPD, sleep apnea, who was brought to the Emergency Room by EMS with complaints of altered sensorium as noticed by her husband. According to the patient's husband, who is with the patient at this time, the patient was found to be with decreased mentation; hence, EMS was called and brought to the Emergency Room for further evaluation. The patient had some ear pain about 2 days ago, for which she has seen her primary care practitioner and was prescribed some oral antibiotics, which she has been taking for the past 2 days. [name] husband admits that patient has been having decreased oral intake for the past 2 days. In the Emergency Room, the patient was found to have decreased mentation,, was given 1 mg of Narcan by the ED physician and was also given p.o. lactulose and the work-up revealed elevated ammonia level of 54 and elevated liver functions, and the rest of the labs were unremarkable. CT head non-contrast study was done, which was negative for any acute intracranial pathology. Hospital service was consulted for further evaluation and management. While waiting in the Emergency Room, the patient started regaining her mentation slowly. She was drowsy and sleepy at this time, but arousable and responded to  questions appropriately and following some commands. The patient denies any chest pain. No shortness of breath. She does have some nausea, but no vomiting. No abdominal pain. No urinary symptoms. She does have some nasal congestion with some bilateral ear pain, ongoing for the past 3 to 4 days.  The patient was also noted to have borderline low blood pressure, but she is drowsy, but arousable and responds to questions appropriately. Blood cultures were done and sent.   PAST MEDICAL HISTORY: 1. Hypertension.  2. Hepatic encephalopathy.  3. Diabetes mellitus type 2.  4. Fatty liver.  5. Hyperlipidemia.  6. Bipolar disorder.  7. History of MI/coronary artery disease.  8. COPD.   9. Hypothyroidism.  10. Sleep apnea.    PAST SURGICAL HISTORY:  1. Hysterectomy.  2. Bladder surgery.  3. Cholecystectomy.  4. Tubal ligation.   ALLERGIES: CODEINE, MORPHINE, REMERON AND VICODIN.   SOCIAL HISTORY: She is married, lives with her husband. No history of smoking, alcohol or substance abuse.    FAMILY HISTORY: Mother is alive. She has diabetes and kidney problems and she is on dialysis and father died of suicide .   HOME MEDICATIONS:  1. Alprazolam 0.25 mg 1 tablet orally 2 times a day.  2. Fenofibric acid 135 mg capsule 1 capsule orally once a day.  3. Flonase nasal spray 2 sprays in each nostril once a day.  4. Gabapentin 300 mg tablet 1 tablet orally 2 times a day.  5. Humalog sliding scale 3 times a day before meals.  6. Insulin detemir 25 units subcutaneously 2 times a day.  7. Lactulose 30 mL orally 3 times a day.  8. Lamotrigine 100 mg 1 tablet orally 1 tablet at bedtime and 25 mg tablet in the morning.  9. Levothyroxine 75 mcg 1 tablet orally in the morning.  10. Magnesium oxide 400 mg tablet 1 tablet orally 2 times a day.  11. Meloxicam as needed.  12. Metoprolol tartrate 25 mg tablet one-half tablet orally 2 times a day.  13. Pantoprazole 40 mg tablet 1 tablet orally once a day.   14. Quetiapine 300 mg tablet orally 3 tablets once a day at bedtime.  15. Quinapril 5 mg 1 tablet orally once a day.  16. Rifaximin 550 mg tablet 1 tablet orally 2 times a day.  17. Sucralfate 1 gram orally 1 tablet 4 times a day.  18. Vitamin D 50,000 units 1 capsule orally 3 times a week.   REVIEW OF SYSTEMS:  CONSTITUTIONAL: Negative for fever. Noted to have generalized weakness with decreased sensorium  of 1 day's duration.  EYES: Negative for blurred vision, double vision. No pain. No redness. No discharge.  EARS, NOSE, AND THROAT: Positive for some ear ache and diagnosed to have some ear infection and was prescribed some oral antibiotics recently by her primary care practitioner.  RESPIRATORY: Negative for cough, wheezing, dyspnea, hemoptysis, painful respiration.  CARDIOVASCULAR: Negative for chest pain, palpitations, dizziness.  GASTROINTESTINAL: Some mild nausea, otherwise negative for vomiting, diarrhea. No abdominal pain. No hematemesis.  GENITOURINARY: Negative for dysuria, frequency, or urgency.  ENDOCRINE: Negative for polyuria, nocturia.  HEMATOLOGIC: Negative for anemia, easy bruising or bleeding.  INTEGUMENTARY: Negative for acne, skin rash, or lesions.  MUSCULOSKELETAL: History of chronic arthritis and currently denies any arthritic pain.  NEUROLOGIC: Found to have decreased sensorium, which is improving gradually. The patient is sleepy but arousable and answering questions appropriately.  PSYCHIATRIC: History of depression on medications, stable.   PHYSICAL EXAMINATION: VITAL SIGNS: Temperature 97.4 degrees Fahrenheit, pulse rate 105 per minute, respirations 20 per minute, blood pressure 91/58, oxygen saturation 92% on room air.  GENERAL: Well-developed, well-nourished. She is sleepy but arousable, in no acute distress, comfortably resting in the bed.  HEAD: Atraumatic, normocephalic.  EYES: Pupils are equal, react to light and accommodation. No conjunctival pallor. No  icterus. Extraocular movements intact.  NOSE: No drainage. No lesions.  EARS: No drainage. No external lesions.  ORAL CAVITY: No mucosal lesions. No exudates. Dry mucosa.  NECK: Supple. No JVD. No thyromegaly. No carotid bruit. Range of motion of neck within normal limits.  RESPIRATORY: Good respiratory effort. Not using accessory muscles of respiration. Bilateral vesicular breath sounds present. No rales or rhonchi.  CARDIOVASCULAR: S1, S2 regular. No murmurs, gallops, or clicks. Peripheral pulses equal at carotid, femoral, and pedal pulses. No peripheral edema.  GASTROINTESTINAL: Abdomen is soft and nontender. No hepatosplenomegaly. No masses. No rigidity. No guarding. Bowel sounds present and equal in all four quadrants.  GENITOURINARY: Deferred.  MUSCULOSKELETAL: No joint tenderness or effusion. Range of motion adequate. Strength and tone equal bilaterally.  SKIN: Inspection within normal limits.  LYMPHATIC: No cervical lymphadenopathy.  VASCULAR: Good dorsalis pedis and posterior tibial pulses.  NEUROLOGICAL: She is sleepy and drowsy, but arousable. Cranial nerves II through XII grossly intact. No sensory deficit. Motor strength is 5/5 in both upper and lower extremities. DTRs are 2+ bilaterally and symmetrical. Plantars downgoing. PSYCHIATRIC: Alert, awake, and oriented x3. She is sleepy but arousable. Judgment and insight adequate. Memory and mood within normal limits.   LABORATORY DATA: Serum glucose 216, BUN 22, creatinine 1.4, sodium 134, potassium 3.9, chloride 99,  bicarbonate 26, calcium 10.2, plasma ammonia 54. Ethanol less than 3. Total protein 6.9, albumin 3.1, total bilirubin 0.4, alkaline phosphatase 160, AST 98, ALT 82. Troponin less than 0.02. Urine drug screen is positive for benzodiazepine, opiates and tricyclic antidepressants. WBC 8.7, hemoglobin 12, hematocrit 36.4, platelet count 271,000.  Urinalysis: WBC 7, bacteria 1+. Serum acetaminophen level 10. Serum salicylate less  than 1.7.   IMAGING STUDIES: CT of the head, non-contrast study: No acute intracranial pathology. Severe paranasal sinusitis and bilateral middle ear and mastoid effusions.   EKG: Sinus tachycardia with ventricular rate of 101 beats per minute, nonspecific T-wave  changes.    ASSESSMENT AND PLAN: A 49 year old Caucasian female with a past medical history of multiple medical problems including hepatic encephalopathy, hypertension, hyperlipidemia, diabetes mellitus type 2, fatty liver, bipolar disorder, hypothyroidism, sleep apnea, chronic obstructive pulmonary disease, history of myocardial infarction/coronary artery disease, presents to the Emergency Room with complaints of altered sensorium of one day's duration.  Found to have elevated ammonia of 54, received p.o. lactulose in the Emergency Room with improving mentation.  1. Acute encephalopathy secondary to hepatic encephalopathy. Cause not known. Plan: Admit to Critical Care Unit step-down unit. Continue oral lactulose, rifaximin, maintain neurologic checks follow up ammonia levels and LFTs.  2. Sinusitis/mastoiditis.  Obtain blood cultures. Start IV antibiotics, Unasyn, follow up cultures.  3. Hypotension. Likely secondary due to dehydration, rule out sepsis. Plan: Blood cultures obtained. IV Unasyn for possible sinusitis. IV hydration, monitor urine output, and blood pressure.  4. Hypertension. Blood pressure low. Hold anti-hypertensive medications for now and restart medications once the blood pressure normalizes.  5. Diabetes mellitus, on insulin. Continue same, sliding scale insulin and monitor blood sugars.  6. Chronic obstructive pulmonary disease, not on any medications. The patient is stable. Monitor.  7. Hypothyroidism. Continue levothyroxine.   8. Depression. On home medications, stable. Continue same.  9. Sleep apnea on home oxygen at night. Continue same.  10. Deep vein thrombosis prophylaxis. Subcutaneous Lovenox.   11. Gastrointestinal prophylaxis. Proton pump inhibitor.   CODE STATUS: Full code.   TIME SPENT: 55 minutes.     ____________________________ Juluis Mire, MD enr:lm D: 09/14/2014 02:56:36 ET T: 09/14/2014 05:23:24 ET JOB#: [id number]  cc: Juluis Mire, MD, <Dictator> Enid Derry, MD Juluis Mire MD ELECTRONICALLY SIGNED 09/14/2014 19:17

## 2015-01-03 NOTE — Consult Note (Signed)
Chief Complaint and History:  Referring Physician Dr. Margaretmary Eddy   Chief Complaint Uncontrolled diabetes   Allergies:  Remeron: Swelling  Morphine: Anaphylaxis  Codeine: Itching  Vicodin: Itching  Assessment/Plan:  Assessment/Plan Pt seen for uncontrolled diabetes. Sugars in the 290-400 range despite NovoLog SSI and NovLog 70/30 insulin dosed at 100 units BID. Spoke with pt at length. She was examined and chart was reviewed.   A/ Uncontrolled type 2 diabetes  P/ Continue current dose of 70/30 Mix Continue current NovoLog SSI Add metformin 500 mg bid (had been held due to liver dis) F/U as out-pt with her Endo, Dr. Chalmers Cater in [place].  Will follow with you. Full consult dictated.   Electronic Signatures: Judi Cong (MD)  (Signed 22-Mar-16 08:35)  Authored: Chief Complaint and History, ALLERGIES, Assessment/Plan   Last Updated: 22-Mar-16 08:35 by Judi Cong (MD)

## 2015-01-03 NOTE — H&P (Signed)
PATIENT NAME:  [name], [name] MR#:  [id number] DATE OF BIRTH:  11-09-1965  DATE OF ADMISSION:  11/19/2014  CONSULT REQUESTED BY: Dr. Weber Cooks  PRIMARY CARE PROVIDER: Dr. Marzetta Board  REASON FOR CONSULTATION: Opinion regarding patient's diabetes, hypertension, liver cirrhosis, hyperlipidemia and hypertriglyceridemia.   HISTORY OF PRESENT ILLNESS: The patient is a 49 year old white female with multiple recurrent ED visits, admissions, who has been admitted by psychiatry for depression and mood disorder. We are asked to see her regarding her diabetes. [name] blood sugars here have been in the 300s. The patient states that she was previously on 70/30, but then switched over to Levemir recently by her new endocrinologist, 75 units b.i.d., and then that has not been working, so the plan is for her to be started on NPH according to her. The patient also reports that she has a history of hepatic encephalopathy and was seen by her liver specialist and was taken off Xifaxan and lactulose. She otherwise denies any fevers, chills. No chest pains, palpitations.   PAST MEDICAL HISTORY:  1. Significant for hypotension.  2. History of hepatic encephalopathy.  3. Diabetes type 2.  4. History of fatty liver.  5. Hyperlipidemia.  6. Bipolar disorder.  7. History of myocardial infarction. 8. Coronary artery disease.  9. Chronic obstructive pulmonary disease.  10. Hypothyroidism.  11. Sleep apnea.   PAST SURGICAL HISTORY: Status post hysterectomy, bladder surgery, cholecystectomy, and tubal ligation.   ALLERGIES: CODEINE, MORPHINE, REMERON AND VICODIN.   SOCIAL HISTORY: No history of smoking, alcohol substance abuse.   FAMILY HISTORY: Mother is alive. She has diabetes and kidney problems. She is on dialysis. Father died of suicide.   MEDICATIONS: Alprazolam 0.25 b.i.d., duloxetine 60 daily, vitamin D 50,000 international units 1 tablet every 2 weeks, fenofibrate 45 p.o. daily, gabapentin 300 once in the  morning and 2 tabs at bedtime, Lamictal 125 p.o. at bedtime, tramadol 50 every 4 hours p.r.n., duloxetine 120 daily, levothyroxine 0.075 mg daily, milk of magnesia 30 mL at bedtime p.r.n. sleep, metoprolol 12.5 b.i.d., fenofibric acid 45 one tabs p.o. daily, Protonix 40 daily, quinapril 5 mg daily.   REVIEW OF SYSTEMS: Negative for fevers. No weight loss. No weight gain.  EYES: Negative for blurred vision. No double vision. No pain. No redness. No discharge.  EAR, NOSE, AND THROAT: Denies any difficulty with hearing. No tinnitus.  RESPIRATORY: Denies any cough, wheezing. No dyspnea,  CARDIOVASCULAR: Denies any chest pain, palpitations or syncope.  GASTROINTESTINAL: Denies any nausea, vomiting or diarrhea.  GENITOURINARY: Denies any dysuria, frequency, urgency.  ENDOCRINE: Denies any polyuria, nocturia.  HEMATOLOGIC: Denies easy bruisability or bleeding.  SKIN: Denies any acne or rash.  MUSCULOSKELETAL: Denies any joint pains.  NEUROLOGIC: Denies any CVA, transient ischemic attack.  PSYCHIATRIC: Has depression and anxiety.   PHYSICAL EXAMINATION: 97.7, pulse 109, respirations 18, blood pressure 138/87, O2 of 93%.  GENERAL: No acute distress.  HEENT: Head atraumatic, normocephalic. Pupils equally round, reactive to light and accommodation. There is no conjunctival pallor. No scleral icterus. Nasal exam shows no drainage or ulceration.  OROPHARYNX: Clear without any exudate.  NECK: Supple without any thyromegaly.  CARDIOVASCULAR: Regular rate and rhythm. No murmurs, rubs, clicks, or gallops.  LUNGS: Clear to auscultation bilaterally without any rales, rhonchi, wheezing.  ABDOMEN: Soft, nontender, nondistended. Positive bowel sounds x4.  EXTREMITIES: No clubbing, cyanosis, or edema.  SKIN: No rash.  LYMPH NODES: Nonpalpable.  MUSCULOSKELETAL: There is no erythema or swelling.  VASCULAR: Good DP/PT pulses.  EXTREMITIES: No clubbing, cyanosis, or edema.  SKIN: No rash.  LYMPH NODES:  Nonpalpable.  MUSCULOSKELETAL: There is no erythema or swelling.   LABORATORY DATA: Glucose 191, BUN 15, creatinine 0.90, sodium 136, potassium 4.4, calcium 10.4. Ammonia was slightly elevated at 40. LFTs: Total protein 7.0, albumin 4.0, bilirubin total 0.3, alkaline phosphatase 78, AST 57, ALT 53. TSH 1.077. Toxic urine drug screen positive for benzos and tricyclics. WBC 16.2, hemoglobin 11.9, platelet count 302,000. Urinalysis shows 3+ bacteria.   URINALYSIS: Nitrites negative, leukocytes negative.   IMAGING: CT of the abdomen and pelvis shows fatty infiltration of the liver. Pelvic ultrasound shows hypoechoic structure in the left adnexal area.    ASSESSMENT AND PLAN: The patient is a 49 year old with multiple medical problems admitted to [facility].   1. Diabetes. We continue sliding scale. [name] treatment has been changed to Levemir, but that was not working, so at this point, it is not clear what she is taking, I will go ahead and discontinue 70/30 insulin and a high-dose sliding scale. Monitor blood sugars. Adjust her regimen accordingly.  2. Hypertension. Continue metoprolol and quinapril.  3. History hepatic encephalopathy according to her hepatologist. Has discontinued Xifaxan and lactulose. Her mental status is currently stable.  4. Hypertriglyceridemia. Continue Tricor.  5. Hypothyroidism. Continue levothyroxine.  6. Fever on presentation. Monitor her temperature curve. If she has fever, we will do another further workup, including a chest x-ray, repeat urine analysis and urine cultures.   TIME SPENT ON THIS PATIENT'S HISTORY AND PHYSICAL: 55 minutes.   ____________________________ Lafonda Mosses Posey Pronto, MD shp:ap D: 11/20/2014 14:04:00 ET T: 11/20/2014 14:40:31 ET JOB#: [id number]  cc: Milanna Kozlov H. Posey Pronto, MD, <Dictator> Alric Seton MD ELECTRONICALLY SIGNED 12/04/2014 14:13

## 2015-01-03 NOTE — H&P (Signed)
PATIENT NAME:  [name], [name] MR#:  [id number] DATE OF BIRTH:  [birth year]  DATE OF ADMISSION:  11/20/2014  IDENTIFYING INFORMATION: The patient is a 49 year old married female with history of bipolar disorder, who was admitted from the Emergency Room for physical problems and also having suicidal ideations. She was initially evaluated by Dr. Weber Cooks.   CHIEF COMPLAINT: "My mood has been unstable."   HISTORY OF PRESENT ILLNESS: Most of the history was obtained from the patient, as well as from review of the chart. The patient reported that her mood has been unstable since she was taken off of her Depakote, which was causing worsening of her liver functions. She reported that she started having temper outbursts, as well as worsening of depression, as well as manic symptoms. She is currently following with Dr. Jacqualine Code at [facility]. She was taken off of the Depakote due to liver damage. The patient reported that she started having mood swings, anger, anxiety, and having manic symptoms. Her last manic episode was 1 week ago, when she was cleaning and scrubbing. After that, she became depressed and has been having hopelessness, helplessness, crying spells, decreased sleep. She reported that Seroquel helps with her sleep. She currently lives with her mother, husband, as well as grandchildren. She reported now the grandchild is old enough to go back to school, and her mother has also healed from her leg injury, and now she is going to go back to her home. The patient feels abandoned, as she does not have anything else to do at home. She reported that she spends time coloring books. The patient reported that she wants her medications to be adjusted and is looking for something else to replace the Depakote, as it was helping with her mood symptoms. She currently denies having any suicidal ideations or plans.   PAST PSYCHIATRIC HISTORY: The patient has history of psychiatric hospitalization in the past, and her last  hospitalization was at 2014. She is currently following with Dr. Jacqualine Code at [facility]. Does not have any history of suicide attempts in the past.   PAST MEDICAL HISTORY: Diabetes, hypertension, hypothyroidism, dyslipidemia, chronic fatty liver. She was recently taken off of the Depakote due to persistent fatty liver and elevated ammonia.   SOCIAL HISTORY: The patient currently lives with her husband, who has a history of seizure disorder. Her mother was living at home and she is also caring for her grandchildren. She has 2 daughters.   SUBSTANCE ABUSE HISTORY: The patient currently denied using any drugs or alcohol.   ALLERGIES: CODEINE, MORPHINE, REMERON, AND VICODIN.   REVIEW OF SYSTEMS: Denied tingling or weakness.   VITAL SIGNS: Temperature 97.7, pulse 109, respirations 18, blood pressure 138/87.   LABORATORY DATA: Glucose 191, BUN 15, creatinine 0.9. Sodium 136, potassium 4.4, chloride 97, bicarbonate 26, anion gap 13, calcium was 10.4. Lipase 50. Alcohol level less than 5. Protein 7.0, albumin 4.0, bilirubin 0.3, alkaline phosphatase 78, AST 57, AST 53 TSH 1.077. Urine drug screen positive for benzodiazepines and tricyclic antidepressants. WBC 16.2, RBC 4.27, hemoglobin 11.9, hematocrit 37, MCV is 87, RDW is 14.1.   MENTAL STATUS EXAMINATION: The patient is a moderately built female who appeared her stated age. She was calm and cooperative and she maintained good eye contact. Her speech was low in tone and volume. Mood was anxious. Affect was congruent. Thought process was logical, goal-directed. Thought content was not delusional. She denied having any auditory or visual hallucinations. She denied having any suicidal ideations or plans. She  was awake, alert, and oriented x 3. Her insight and judgment were fair. Intelligence was normal. DIAGNOSTIC IMPRESSION: AXIS I: Bipolar disorder, depressed, most recent episode without psychotic features.  AXIS II: None.  AXIS III: Diabetes, hypertension,  dyslipidemia, hypothyroidism.   TREATMENT PLAN: 1. The patient admitted to the inpatient behavioral health unit for stabilization and safety.  2. Discussed with her about the medications at length, and I will adjust her medications as follows:        1.  I will decrease the dose of Cymbalta to 60 mg daily to decrease her mood swings.        2. I will titrate the lamotrigine to 150 mg p.o. daily.        3. I will also adjust the dose of Seroquel to 800 mg at bedtime and 25 mg p.o. b.i.d.        4. She will continue on alprazolam 0.25 mg p.o. b.i.d.   The patient agreed with the plan. We will continue to monitor her.   Thank you for allowing me to participate in her care this morning.     ____________________________ Cordelia Pen. Gretel Acre, MD usf:mw D: 11/21/2014 10:30:45 ET T: 11/21/2014 10:58:13 ET JOB#: [id number]  cc: Cordelia Pen. Gretel Acre, MD, <Dictator> Jeronimo Norma MD ELECTRONICALLY SIGNED 11/22/2014 12:11

## 2015-01-03 NOTE — Consult Note (Signed)
PATIENT NAME:  [name], PROFFIT MR#:  [id number] DATE OF BIRTH:  [birth year]  DATE OF CONSULTATION:  11/23/2014  REFERRING PHYSICIAN:  Nicholes Mango, M.D.  CONSULTING PHYSICIAN:  A. Lavone Orn, MD  CHIEF COMPLAINT: Uncontrolled diabetes.   HISTORY OF PRESENT ILLNESS: This is a 49 year old female seen in consultation at the request of Dr. Margaretmary Eddy for uncontrolled diabetes. The patient was admitted on 11/19/2014 to behavioral medicine for concerns of mood disorder. She has a long history of type 2 diabetes mellitus. She follows with Dr. Chalmers Cater, [facility] endocrinology in [place]. She has been on a variety of medications as an outpatient. States metformin was stopped within the last few months due to concerns of liver disorder. She has a history of fatty liver disease. Had a liver biopsy. He has been waiting to hear results of biopsy before metformin could be restarted. States that she was contacted within last day or two with this biopsy result and was told it was unremarkable. Believes it is now safe to restart metformin. During this hospitalization, she has been managed with 70/30/mix insulin. Dose now at 100 units b.i.d.  Also receiving NovoLog insulin sliding scale before meals and at bedtime. Over the last 24 hours, she has received in total 238 units of insulin and blood sugars have ranged 297-482. She denies recent weight loss. No use of glucocorticoids. No known illness or infection. No polyuria, no increased thirst. Diabetes is complicated by peripheral neuropathy. States she has regular eye examinations, and maybe history of glaucoma, but no known retinopathy. No known nephropathy.   PAST MEDICAL HISTORY:  1.  Type 2 diabetes mellitus.  2.  Diabetic peripheral neuropathy. 3.  Fatty liver disease.  4.  Hypothyroidism.  5.  Hypertension.  6.  Hyperlipidemia.  7.  Possible glaucoma.  8.  History of seizure disorder.  9.  Mood disorder.   CURRENT MEDICATIONS:  1.  Duloxetine 60 mg daily.  2.   Gabapentin 600 mg at bedtime.  3.  Lamictal 150 mg daily.  4.  Levothyroxine 75 mcg daily.  5.  Lopressor 12.5 mg b.i.d.  6.  Fenofibric acid 45 mg daily.  7.  Pantoprazole 40 mg daily.  8.  Quetiapine 25 mg b.i.d.  9.  Quetiapine 800 mg at bedtime.  10.  Quinapril 5 mg daily.  11.  NovoLog 70/30/mix 100 units.  12.  NovoLog insulin sliding scale before meals and at bedtime.   FAMILY HISTORY: Positive for diabetes in mother and grandfather.   ALLERGIES: CODEINE, MORPHINE, REMERON, AND VICODIN.   PAST SURGICAL HISTORY:  1.  Hysterectomy.  2.  Bladder surgery.  3.  Cholecystectomy.  4.  Tubal ligation.   SOCIAL HISTORY: Denies tobacco and alcohol use.  REVIEW OF SYSTEMS:  GENERAL: No weight loss. No fever.  HEENT: No blurred vision. No sore throat.  NECK: No neck pain. No dysphasia.  CARDIAC: No chest pain.  No palpitation.  PULMONARY: No cough. No shortness of breath.  ABDOMEN: No abdominal pain. No nausea.  EXTREMITIES: No lower extremity swelling.  SKIN: No rash or recent skin changes. ENDOCRINE:  No heat or cold intolerance.  GENITOURINARY: No polyuria or dysuria.  NEUROLOGIC: History of seizures, no headaches, no falls.   PHYSICAL EXAMINATION:  VITAL SIGNS: Height 58.9 inches, weight 87 kg, temperature 98.6, pulse 123, respirations 20, blood pressure 160/84.  GENERAL: White female in no acute distress.  HEENT: EOMI.  Oropharynx is clear. Mucous membranes moist.  NECK: Supple. No thyromegaly.  CARDIAC: Regular  rate and rhythm. No murmur.  PULMONARY: Clear to auscultation bilaterally. No wheeze.  ABDOMEN: Diffusely soft, nontender, nondistended.  EXTREMITIES: No peripheral edema is present.  NEUROLOGIC: Alert and oriented. Cranial nerves intact.  SKIN: No rash or dermatopathy noted. A bilateral barefoot exam shows no calluses or lesions. No ulcerations.   LABORATORY DATA: 11/19/2014:  Glucose 191, BUN 15, creatinine 0.9, sodium 4.4. EGFR greater than 60. Plasma  ammonia 40, albumin 4.0, AST 57, ALT 53. TSH 1.007. Urine drug screen positive for benzodiazepine and tricyclic antidepressant, otherwise negative. Hemoglobin 11.9, hematocrit 37.0, WBC 16.2. Hemoglobin A1c 10.6%.    ASSESSMENT:  1.  Severely uncontrolled type 2 diabetes mellitus.  2.  Diabetic peripheral neuropathy.  3.  Obesity.  4.  Long-term use of insulin.  5.  Mood disorder.   RECOMMENDATIONS:  1.  Agree with ongoing use of 70/30/mix. Dose was increased just yesterday to 100 units b.i.d.  No change in dose recommended at this time.  2.  Continue NovoLog insulin sliding scale as written.  3.  Appreciate assistance of diabetic inpatient glycemic control team.  4.  Restart metformin, give 500 mg b.i.d. 5.  Patient would benefit from weight loss.  6.  The patient will need close outpatient follow-up with her endocrinologist, Dr. Chalmers Cater at [facility] endocrinology.   Thank you for the kind request for consultation. I will follow along with you.    ____________________________ A. Lavone Orn, MD ams:sp D: 11/24/2014 08:22:21 ET T: 11/24/2014 09:18:47 ET JOB#: [id number]  cc: A. Lavone Orn, MD, <Dictator> Sherlon Handing MD ELECTRONICALLY SIGNED 12/02/2014 11:52

## 2015-01-03 NOTE — Discharge Summary (Signed)
PATIENT NAME:  [name], [name] MR#:  [id number] DATE OF BIRTH:  08/18/1966  DATE OF ADMISSION:  09/14/2014 DATE OF DISCHARGE:  09/14/2014  PRESENTING COMPLAINT:  Altered mental status.   DISCHARGE DIAGNOSES: 1.  Acute encephalopathy, improved.  2.  Bilateral otitis media.  3.  Sinusitis, chronic.  4.  History of fatty liver.  5.  Hepatic encephalopathy, mild, resolved.   CODE STATUS:  Full code.   DISCHARGE MEDICATIONS:  1.  Fenofibric 135 mg p.o. daily.  2.  Lamotrigine 125 mg at bedtime.  3.  Levothyroxine 75 mcg p.o. daily.  4.  Gabapentin 300 mg 1 tablet twice a day in the morning and 2 capsules at bedtime.  5.  Quetiapine 300 mg 3 tablets, which is 900 mg, at bedtime.  6.  Protonix 40 mg daily.  7.  Quinapril 5 mg daily.  8.  Vitamin D2, 50,000 international units on Monday, Wednesday, and Friday.  9.  Rifaximin 550 mg 1 tablet b.i.d.  10.  Sucralfate 1 gram 4 times a day.  11.  Meloxicam 15 mg once a day with food.  12.  Metoprolol 25 mg 1/2 tablet b.i.d.  13.  Humalog sliding scale.  14.  Insulin detemir 25 units b.i.d.  15.  Alprazolam 0.25 mg b.i.d.  16.  Lactulose 30 cc tid 17.  Mag-Ox 400 mg b.i.d. 18.  Tobrex 0.3% two drops to affected eyes 4 times a day.  19.  Flonase 0.05 two sprays in each nostril daily.  20.  Guaifenesin 400 mg every 6 hours as needed.  21.  Augmentin 875 b.i.d.  22.  Mucinex 600 b.i.d.   FOLLOWUP:   1.  Follow up with ENT for bilateral ear infection in 1 to 2 weeks with sinusitis.  2.  Follow up with [name], primary care physician.   LABORATORY DATA: Ammonia at discharge was 29. CT of the head shows no acute intracranial process, severe paranasal sinusitis, bilateral middle ear and mastoid effusions. UA is negative for UTI. Urine drug screen is positive for tricyclics, opiates, and benzodiazepines. Blood culture is negative in 18 to 24 hours. Troponin is less than 0.02. Ammonia level was 54 on admission.   BRIEF SUMMARY OF HOSPITAL  COURSE:  [name] is a 49 year old, morbidly obese Caucasian female who has a history of hypertension, hyperlipidemia, type 2 diabetes, fatty liver, and bipolar disorder, who comes in with:  1.  Altered mental status/acute encephalopathy suspected due to mild hepatic encephalopathy. Her rifaximin and lactulose were resumed. IV fluids were given. [name] ammonia is now down to 29. Mentation is at baseline.  2.  Acute sinusitis and mastoiditis with bilateral otitis media, changed to p.o. Augmentin for 2 weeks. She will follow up with ENT as outpatient. Mucinex was prescribed.  3.  Hypotension, improved.  4.  Hypertension, resumed medications at discharge.  5.  Type 2 diabetes, on insulin. Home medications were continued.  6.  History of chronic obstructive pulmonary disease, stable.  7.  Hypothyroidism. Continue levothyroxine.  8.  Sleep apnea on home oxygen at night.   Hospital stay otherwise remained stable. The patient remained a full code.   TIME SPENT:  40 minutes.    ____________________________ Hart Rochester Posey Pronto, MD sap:nb D: 09/15/2014 16:46:13 ET T: 09/16/2014 01:36:36 ET JOB#: [id number]  cc: Damaris Abeln A. Posey Pronto, MD, <Dictator> Ilda Basset MD ELECTRONICALLY SIGNED 09/17/2014 11:22

## 2015-01-08 DIAGNOSIS — M79606 Pain in leg, unspecified: Secondary | ICD-10-CM | POA: Diagnosis not present

## 2015-01-08 DIAGNOSIS — G2581 Restless legs syndrome: Secondary | ICD-10-CM | POA: Diagnosis not present

## 2015-01-08 DIAGNOSIS — H1013 Acute atopic conjunctivitis, bilateral: Secondary | ICD-10-CM | POA: Diagnosis not present

## 2015-01-08 DIAGNOSIS — M7552 Bursitis of left shoulder: Secondary | ICD-10-CM | POA: Diagnosis not present

## 2015-01-08 DIAGNOSIS — E039 Hypothyroidism, unspecified: Secondary | ICD-10-CM | POA: Diagnosis not present

## 2015-01-08 DIAGNOSIS — M7551 Bursitis of right shoulder: Secondary | ICD-10-CM | POA: Diagnosis not present

## 2015-01-11 DIAGNOSIS — E039 Hypothyroidism, unspecified: Secondary | ICD-10-CM | POA: Diagnosis not present

## 2015-01-11 DIAGNOSIS — E781 Pure hyperglyceridemia: Secondary | ICD-10-CM | POA: Diagnosis not present

## 2015-01-11 DIAGNOSIS — E559 Vitamin D deficiency, unspecified: Secondary | ICD-10-CM | POA: Diagnosis not present

## 2015-01-11 DIAGNOSIS — E1140 Type 2 diabetes mellitus with diabetic neuropathy, unspecified: Secondary | ICD-10-CM | POA: Diagnosis not present

## 2015-01-18 DIAGNOSIS — J449 Chronic obstructive pulmonary disease, unspecified: Secondary | ICD-10-CM | POA: Diagnosis not present

## 2015-01-18 DIAGNOSIS — R0902 Hypoxemia: Secondary | ICD-10-CM | POA: Diagnosis not present

## 2015-01-18 DIAGNOSIS — G4733 Obstructive sleep apnea (adult) (pediatric): Secondary | ICD-10-CM | POA: Diagnosis not present

## 2015-01-20 DIAGNOSIS — D803 Selective deficiency of immunoglobulin G [IgG] subclasses: Secondary | ICD-10-CM | POA: Insufficient documentation

## 2015-01-20 DIAGNOSIS — N179 Acute kidney failure, unspecified: Secondary | ICD-10-CM | POA: Insufficient documentation

## 2015-01-20 DIAGNOSIS — E559 Vitamin D deficiency, unspecified: Secondary | ICD-10-CM | POA: Insufficient documentation

## 2015-01-20 DIAGNOSIS — R32 Unspecified urinary incontinence: Secondary | ICD-10-CM | POA: Insufficient documentation

## 2015-01-20 DIAGNOSIS — F319 Bipolar disorder, unspecified: Secondary | ICD-10-CM | POA: Insufficient documentation

## 2015-01-20 DIAGNOSIS — R0902 Hypoxemia: Secondary | ICD-10-CM | POA: Insufficient documentation

## 2015-01-20 DIAGNOSIS — J42 Unspecified chronic bronchitis: Secondary | ICD-10-CM | POA: Insufficient documentation

## 2015-01-20 DIAGNOSIS — K7290 Hepatic failure, unspecified without coma: Secondary | ICD-10-CM

## 2015-01-20 DIAGNOSIS — E039 Hypothyroidism, unspecified: Secondary | ICD-10-CM | POA: Insufficient documentation

## 2015-01-20 DIAGNOSIS — K769 Liver disease, unspecified: Secondary | ICD-10-CM | POA: Insufficient documentation

## 2015-01-20 DIAGNOSIS — G2581 Restless legs syndrome: Secondary | ICD-10-CM | POA: Insufficient documentation

## 2015-01-20 DIAGNOSIS — M629 Disorder of muscle, unspecified: Secondary | ICD-10-CM

## 2015-01-20 DIAGNOSIS — K7682 Hepatic encephalopathy: Secondary | ICD-10-CM | POA: Insufficient documentation

## 2015-01-20 DIAGNOSIS — K219 Gastro-esophageal reflux disease without esophagitis: Secondary | ICD-10-CM | POA: Insufficient documentation

## 2015-01-20 DIAGNOSIS — J449 Chronic obstructive pulmonary disease, unspecified: Secondary | ICD-10-CM

## 2015-01-20 DIAGNOSIS — G4733 Obstructive sleep apnea (adult) (pediatric): Secondary | ICD-10-CM | POA: Insufficient documentation

## 2015-01-30 ENCOUNTER — Encounter: Payer: Self-pay | Admitting: *Deleted

## 2015-01-30 ENCOUNTER — Emergency Department
Admission: EM | Admit: 2015-01-30 | Discharge: 2015-01-30 | Disposition: A | Discharge status: Home / Self Care | Payer: Medicare Other | Attending: Emergency Medicine | Admitting: Emergency Medicine

## 2015-01-30 ENCOUNTER — Other Ambulatory Visit: Payer: Self-pay

## 2015-01-30 DIAGNOSIS — M549 Dorsalgia, unspecified: Secondary | ICD-10-CM | POA: Insufficient documentation

## 2015-01-30 DIAGNOSIS — R42 Dizziness and giddiness: Secondary | ICD-10-CM

## 2015-01-30 DIAGNOSIS — E119 Type 2 diabetes mellitus without complications: Secondary | ICD-10-CM | POA: Diagnosis not present

## 2015-01-30 DIAGNOSIS — R509 Fever, unspecified: Secondary | ICD-10-CM | POA: Diagnosis not present

## 2015-01-30 DIAGNOSIS — M2550 Pain in unspecified joint: Secondary | ICD-10-CM | POA: Insufficient documentation

## 2015-01-30 DIAGNOSIS — Z794 Long term (current) use of insulin: Secondary | ICD-10-CM | POA: Insufficient documentation

## 2015-01-30 DIAGNOSIS — Z79899 Other long term (current) drug therapy: Secondary | ICD-10-CM | POA: Diagnosis not present

## 2015-01-30 DIAGNOSIS — I10 Essential (primary) hypertension: Secondary | ICD-10-CM | POA: Diagnosis not present

## 2015-01-30 LAB — CBC WITH DIFFERENTIAL/PLATELET
BASOS PCT: 1 %
Basophils Absolute: 0.1 10*3/uL (ref 0–0.1)
EOS ABS: 0.1 10*3/uL (ref 0–0.7)
Eosinophils Relative: 1 %
HCT: 33.5 % — ABNORMAL LOW (ref 35.0–47.0)
Hemoglobin: 10.7 g/dL — ABNORMAL LOW (ref 12.0–16.0)
LYMPHS ABS: 1.2 10*3/uL (ref 1.0–3.6)
Lymphocytes Relative: 13 %
MCH: 27.7 pg (ref 26.0–34.0)
MCHC: 31.8 g/dL — AB (ref 32.0–36.0)
MCV: 87 fL (ref 80.0–100.0)
Monocytes Absolute: 0.4 10*3/uL (ref 0.2–0.9)
Monocytes Relative: 4 %
NEUTROS PCT: 81 %
Neutro Abs: 8 10*3/uL — ABNORMAL HIGH (ref 1.4–6.5)
PLATELETS: 214 10*3/uL (ref 150–440)
RBC: 3.86 MIL/uL (ref 3.80–5.20)
RDW: 14.8 % — ABNORMAL HIGH (ref 11.5–14.5)
WBC: 9.8 10*3/uL (ref 3.6–11.0)

## 2015-01-30 LAB — COMPREHENSIVE METABOLIC PANEL
ALK PHOS: 78 U/L (ref 38–126)
ALT: 36 U/L (ref 14–54)
AST: 46 U/L — AB (ref 15–41)
Albumin: 3.5 g/dL (ref 3.5–5.0)
Anion gap: 10 (ref 5–15)
BUN: 41 mg/dL — ABNORMAL HIGH (ref 6–20)
CO2: 22 mmol/L (ref 22–32)
Calcium: 9.3 mg/dL (ref 8.9–10.3)
Chloride: 102 mmol/L (ref 101–111)
Creatinine, Ser: 1.65 mg/dL — ABNORMAL HIGH (ref 0.44–1.00)
GFR, EST AFRICAN AMERICAN: 41 mL/min — AB (ref >60)
GFR, EST NON AFRICAN AMERICAN: 35 mL/min — AB (ref >60)
GLUCOSE: 325 mg/dL — AB (ref 65–99)
Potassium: 5.6 mmol/L — ABNORMAL HIGH (ref 3.5–5.1)
SODIUM: 134 mmol/L — AB (ref 135–145)
Total Bilirubin: 0.2 mg/dL — ABNORMAL LOW (ref 0.3–1.2)
Total Protein: 6.3 g/dL — ABNORMAL LOW (ref 6.5–8.1)

## 2015-01-30 LAB — URINALYSIS COMPLETE WITH MICROSCOPIC (ARMC ONLY)
BILIRUBIN URINE: NEGATIVE
Glucose, UA: >500 mg/dL — AB
HGB URINE DIPSTICK: NEGATIVE
Ketones, ur: NEGATIVE mg/dL
Nitrite: NEGATIVE
PROTEIN: NEGATIVE mg/dL
Specific Gravity, Urine: 1.015 (ref 1.005–1.030)
pH: 5 (ref 5.0–8.0)

## 2015-01-30 LAB — AMMONIA: Ammonia: 18 umol/L (ref 9–35)

## 2015-01-30 LAB — LIPASE, BLOOD: Lipase: 32 U/L (ref 22–51)

## 2015-01-30 MED ORDER — KETOROLAC TROMETHAMINE 30 MG/ML IJ SOLN
INTRAMUSCULAR | Status: AC
  Filled 2015-01-30: qty 1

## 2015-01-30 MED ORDER — KETOROLAC TROMETHAMINE 30 MG/ML IJ SOLN
30.0000 mg | Freq: Once | INTRAMUSCULAR | Status: AC
  Administered 2015-01-30: 30 mg via INTRAVENOUS

## 2015-01-30 NOTE — Discharge Instructions (Signed)
Dizziness Dizziness is a common problem. It is a feeling of unsteadiness or light-headedness. You may feel like you are about to faint. Dizziness can lead to injury if you stumble or fall. A person of any age group can suffer from dizziness, but dizziness is more common in older adults. CAUSES  Dizziness can be caused by many different things, including:  Middle ear problems.  Standing for too long.  Infections.  An allergic reaction.  Aging.  An emotional response to something, such as the sight of blood.  Side effects of medicines.  Tiredness.  Problems with circulation or blood pressure.  Excessive use of alcohol or medicines, or illegal drug use.  Breathing too fast (hyperventilation).  An irregular heart rhythm (arrhythmia).  A low red blood cell count (anemia).  Pregnancy.  Vomiting, diarrhea, fever, or other illnesses that cause body fluid loss (dehydration).  Diseases or conditions such as Parkinson's disease, high blood pressure (hypertension), diabetes, and thyroid problems.  Exposure to extreme heat. DIAGNOSIS  Your health care provider will ask about your symptoms, perform a physical exam, and perform an electrocardiogram (ECG) to record the electrical activity of your heart. Your health care provider may also perform other heart or blood tests to determine the cause of your dizziness. These may include:  Transthoracic echocardiogram (TTE). During echocardiography, sound waves are used to evaluate how blood flows through your heart.  Transesophageal echocardiogram (TEE).  Cardiac monitoring. This allows your health care provider to monitor your heart rate and rhythm in real time.  Holter monitor. This is a portable device that records your heartbeat and can help diagnose heart arrhythmias. It allows your health care provider to track your heart activity for several days if needed.  Stress tests by exercise or by giving medicine that makes the heart beat  faster. TREATMENT  Treatment of dizziness depends on the cause of your symptoms and can vary greatly. HOME CARE INSTRUCTIONS   Drink enough fluids to keep your urine clear or pale yellow. This is especially important in very hot weather. In older adults, it is also important in cold weather.  Take your medicine exactly as directed if your dizziness is caused by medicines. When taking blood pressure medicines, it is especially important to get up slowly.  Rise slowly from chairs and steady yourself until you feel okay.  In the morning, first sit up on the side of the bed. When you feel okay, stand slowly while holding onto something until you know your balance is fine.  Move your legs often if you need to stand in one place for a long time. Tighten and relax your muscles in your legs while standing.  Have someone stay with you for 1-2 days if dizziness continues to be a problem. Do this until you feel you are well enough to stay alone. Have the person call your health care provider if he or she notices changes in you that are concerning.  Do not drive or use heavy machinery if you feel dizzy.  Do not drink alcohol. SEEK IMMEDIATE MEDICAL CARE IF:   Your dizziness or light-headedness gets worse.  You feel nauseous or vomit.  You have problems talking, walking, or using your arms, hands, or legs.  You feel weak.  You are not thinking clearly or you have trouble forming sentences. It may take a friend or family member to notice this.  You have chest pain, abdominal pain, shortness of breath, or sweating.  Your vision changes.  You notice  any bleeding.  You have side effects from medicine that seems to be getting worse rather than better. MAKE SURE YOU:   Understand these instructions.  Will watch your condition.  Will get help right away if you are not doing well or get worse. Document Released: 02/14/2001 Document Revised: 08/26/2013 Document Reviewed: 03/10/2011 [facility]  Patient Information 2015 [place], [place]. This information is not intended to replace advice given to you by your health care provider. Make sure you discuss any questions you have with your health care provider.    You have been seen in the emergency department today for dizziness. Your workup as shown largely normal results. Please follow-up with your primary care doctor soon as possible regarding today's ER visit and your symptoms. Return to the emergency department for any worsening symptoms, or any other personally concerning symptoms.

## 2015-01-30 NOTE — ED Provider Notes (Signed)
[facility] Emergency Department Provider Note  Time seen: 2:23 PM  I have reviewed the triage vital signs and the nursing notes.   HISTORY  Chief Complaint Dizziness    HPI [name] is a 49 y.o. female with a past medical history of diabetes, hypertension, hyperlipidemia, bipolar who presents the emergency department with dizziness/generalized weakness. According to the patient she has felt dizzy today along with generalized aches and pains which are typical for the patient. Patient states last time she felt dizzy her ammonia levels up so she came to the ER for evaluation. Patient denies abdominal pain, nausea, vomiting, diarrhea. Patient denies any chest pain, shortness breath, diaphoresis. States the dizziness has largely resolved at this point, but she continues to have pain in all of her joints. Patient describes the pain as dull, located in her arms, legs, back and neck, worse with movement.     Past Medical History  Diagnosis Date  . Diabetes mellitus without complication     Type 2, insulin dependent  . Hyperlipidemia   . COPD (chronic obstructive pulmonary disease)   . Asthma   . Hypothyroid   . Morbid obesity   . OSA (obstructive sleep apnea)   . RLS (restless legs syndrome)   . GERD (gastroesophageal reflux disease)   . Bipolar depression   . Vitamin D deficiency   . Urinary incontinence   . Hypoxemia   . Liver lesion     Favored to be benign on CT; MRI of abd w/contract in Aug 2016. Liver Biopsy-Negative, March 2016  . Acute renal failure   . Hypomagnesemia     Patient Active Problem List   Diagnosis Date Noted  . COPD (chronic obstructive pulmonary disease) 01/20/2015  . 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 GERD (gastroesophageal reflux disease) 01/20/2015  . Disorder of smooth muscle 01/20/2015  . Hepatic encephalopathy 01/20/2015  . Hypothyroid   . Morbid obesity   . OSA (obstructive sleep apnea)   . RLS (restless  legs syndrome)   . Bipolar depression   . Vitamin D deficiency   . Urinary incontinence   . Hypoxemia   . Liver lesion   . Acute renal failure   . Hypomagnesemia   . ABSCESS, BREAST, RIGHT 04/07/2009  . PERIPHERAL NEUROPATHY 03/04/2009  . RENAL INSUFFICIENCY 03/04/2009  . HYPOKALEMIA 09/03/2008  . HEMORRHOIDS, EXTERNAL 09/03/2008  . MORBID OBESITY 10/14/2007  . HYPOTHYROIDISM NOS 07/04/2007  . ALLERGIC RHINITIS 07/04/2007  . ASTHMA, PERSISTENT, MODERATE 07/04/2007  . INTERSTITIAL CYSTITIS 07/04/2007  . MIGRAINE, MENSTRUAL 07/04/2007  . HYPERTRIGLYCERIDEMIA 07/03/2007  . FATIGUE, CHRONIC 07/03/2007  . BIPOLAR AFFECTIVE DISORDER, HX OF 07/03/2007  . DM 06/19/2007  . HYPERTENSION, BENIGN ESSENTIAL 06/19/2007    Past Surgical History  Procedure Laterality Date  . Liver biopsy  March 2016    Negative  . Abdominal hysterectomy      Partial  . Cholecystectomy    . Bladder surgery      x 2  . Lung biopsy      Current Outpatient Rx  Name  Route  Sig  Dispense  Refill  . Choline Fenofibrate 45 MG capsule   Oral   Take 45 mg by mouth daily.         . DULoxetine (CYMBALTA) 60 MG capsule   Oral   Take 60 mg by mouth daily.         . insulin lispro (HUMALOG) 100 UNIT/ML injection   Subcutaneous   Inject  into the skin. Every 6 hours before meals, sliding scale         . insulin NPH Human (HUMULIN N,NOVOLIN N) 100 UNIT/ML injection   Subcutaneous   Inject into the skin. 75 units in the am, 50 units in at lunch, 75 units at night         . lamoTRIgine (LAMICTAL) 150 MG tablet   Oral   Take 150 mg by mouth at bedtime.         Marland Kitchen levothyroxine (SYNTHROID, LEVOTHROID) 75 MCG tablet   Oral   Take 75 mcg by mouth daily before breakfast.         . magnesium oxide (MAG-OX) 400 MG tablet   Oral   Take 400 mg by mouth 3 (three) times daily.         . metFORMIN (GLUCOPHAGE-XR) 500 MG 24 hr tablet   Oral   Take 500 mg by mouth. Take 2 tabs twice daily          . metoprolol tartrate (LOPRESSOR) 25 MG tablet   Oral   Take 12.5 mg by mouth 2 (two) times daily.         Marland Kitchen olopatadine (PATANOL) 0.1 % ophthalmic solution   Both Eyes   Place 1 drop into both eyes 2 (two) times daily.         . pantoprazole (PROTONIX) 40 MG tablet   Oral   Take 40 mg by mouth daily.         . pregabalin (LYRICA) 50 MG capsule   Oral   Take 50 mg by mouth 3 (three) times daily.         . QUEtiapine (SEROQUEL) 25 MG tablet   Oral   Take 25 mg by mouth 2 (two) times daily. Take one in am, one at lunch         . QUEtiapine (SEROQUEL) 400 MG tablet   Oral   Take 400 mg by mouth 2 (two) times daily. Take 2 tabs at bedtime         . quinapril (ACCUPRIL) 5 MG tablet   Oral   Take 5 mg by mouth at bedtime.         . sucralfate (CARAFATE) 1 G tablet   Oral   Take 1 g by mouth 4 (four) times daily.         . traZODone (DESYREL) 100 MG tablet   Oral   Take 100 mg by mouth at bedtime.           Allergies Codeine; Hydrocodone-acetaminophen; Mirtazapine; Morphine and related; and Sulfamethoxazole-trimethoprim  Family History  Problem Relation Age of Onset  . Diabetes Mother   . Heart disease Mother   . Hyperlipidemia Mother   . Hypertension Mother   . Kidney disease Mother   . Mental illness Mother   . Hypothyroidism Mother   . Stroke Mother   . Osteoporosis Mother   . Glaucoma Mother   . Hypertension Father   . Asthma Daughter   . Cancer Daughter     throat    Social History History  Substance Use Topics  . Smoking status: Never Smoker   . Smokeless tobacco: Never Used  . Alcohol Use: No    Review of Systems Constitutional: Negative for fever. Cardiovascular: Negative for chest pain. Respiratory: Negative for shortness of breath. Gastrointestinal: Negative for abdominal pain, vomiting and diarrhea. Genitourinary: Negative for dysuria. Musculoskeletal: Positive for backaches. Positive for aches in all of her  joints. Skin: Negative for rash. Neurological: Negative for headaches, focal weakness or numbness. Denies confusion or speech difficulties. 10-point ROS otherwise negative.  ____________________________________________   PHYSICAL EXAM:  VITAL SIGNS: ED Triage Vitals  Enc Vitals Group     BP 01/30/15 1351 115/60 mmHg     Pulse Rate 01/30/15 1351 100     Resp 01/30/15 1351 20     Temp 01/30/15 1351 98.5 F (36.9 C)     Temp Source 01/30/15 1351 Oral     SpO2 01/30/15 1351 95 %     Weight 01/30/15 1351 196 lb (88.905 kg)     Height 01/30/15 1351 4' 11"  (1.499 m)     Head Cir --      Peak Flow --      Pain Score 01/30/15 1353 9     Pain Loc --      Pain Edu? --      Excl. in [place]? --     Constitutional: Alert and oriented. Well appearing Eyes: Normal exam ENT   Mouth/Throat: Mucous membranes are moist. Cardiovascular: Normal rate, regular rhythm. No murmurs Respiratory: Normal respiratory effort without tachypnea nor retractions. Breath sounds are clear  Gastrointestinal: Soft and nontender. No distention. Benign abdominal exam. Musculoskeletal: Nontender with normal range of motion in all extremities.  Neurologic:  Normal speech and language. No gross focal neurologic deficits  Skin:  Skin is warm, dry and intact.  Psychiatric: Mood and affect are normal. Speech and behavior are normal.   ____________________________________________    EKG  EKG reviewed and interpreted by myself shows normal sinus rhythm at 99 bpm, narrow QRS, left axis deviation, nonspecific ST changes.  ____________________________________________    INITIAL IMPRESSION / ASSESSMENT AND PLAN / ED COURSE  Pertinent labs & imaging results that were available during my care of the patient were reviewed by me and considered in my medical decision making (see chart for details).  Patient with dizziness and body aches today. States the body aches are somewhat typical for her but not the dizziness.  Last time she felt dizzy her ammonia was elevated so she came to the ER to have this evaluated. States the dizziness has largely resolved at this time. Denies any other concerning symptoms such as headache, focal weakness/numbness, chest pain, or abdominal pain.  ____________________________________________   FINAL CLINICAL IMPRESSION(S) / ED DIAGNOSES  Dizziness Body aches  Harvest Dark, MD 01/31/15 1250

## 2015-01-30 NOTE — ED Notes (Signed)
Per EMS report, patient c/o dizziness when standing and bilateral leg pain. Patient walked to the stretcher. Patient is alert and oriented. Patient is cooperative and vocal.

## 2015-02-05 ENCOUNTER — Ambulatory Visit (INDEPENDENT_AMBULATORY_CARE_PROVIDER_SITE_OTHER): Payer: Medicare Other | Admitting: Family Medicine

## 2015-02-05 ENCOUNTER — Other Ambulatory Visit: Payer: Self-pay

## 2015-02-05 ENCOUNTER — Encounter: Payer: Self-pay | Admitting: Family Medicine

## 2015-02-05 VITALS — BP 107/72 | HR 79 | Temp 98.1°F | Wt 196.0 lb

## 2015-02-05 DIAGNOSIS — R29898 Other symptoms and signs involving the musculoskeletal system: Secondary | ICD-10-CM | POA: Diagnosis not present

## 2015-02-05 DIAGNOSIS — M545 Low back pain, unspecified: Secondary | ICD-10-CM

## 2015-02-05 DIAGNOSIS — R292 Abnormal reflex: Secondary | ICD-10-CM | POA: Diagnosis not present

## 2015-02-05 DIAGNOSIS — M25552 Pain in left hip: Secondary | ICD-10-CM

## 2015-02-05 DIAGNOSIS — M25562 Pain in left knee: Secondary | ICD-10-CM

## 2015-02-05 DIAGNOSIS — M25572 Pain in left ankle and joints of left foot: Secondary | ICD-10-CM

## 2015-02-05 MED ORDER — PREGABALIN 75 MG PO CAPS: 75.0000 mg | ORAL_CAPSULE | Freq: Three times a day (TID) | ORAL | Status: DC

## 2015-02-05 NOTE — Patient Instructions (Addendum)
Start turmeric, over-the-counter, and take per package directions. Apply ice topically over any painful areas, through a thin piece of cloth like a t-shirt, 20 minutes at a time 3x a day. You will hear from my staff about the referral for the physical therapy and the nerve studies.  If you do not hear within one week, please call us back at [phone]. You can go to [facility] to have your back xray done. Increase Lyrica from 50 mg to 75 mg Have your urologist look into any bladder complaints [facility])   Back Pain, Adult Low back pain is very common. About 1 in 5 people have back pain.The cause of low back pain is rarely dangerous. The pain often gets better over time.About half of people with a sudden onset of back pain feel better in just 2 weeks. About 8 in 10 people feel better by 6 weeks.  CAUSES Some common causes of back pain include:  Strain of the muscles or ligaments supporting the spine.  Wear and tear (degeneration) of the spinal discs.  Arthritis.  Direct injury to the back. DIAGNOSIS Most of the time, the direct cause of low back pain is not known.However, back pain can be treated effectively even when the exact cause of the pain is unknown.Answering your caregiver's questions about your overall health and symptoms is one of the most accurate ways to make sure the cause of your pain is not dangerous. If your caregiver needs more information, he or she may order lab work or imaging tests (X-rays or MRIs).However, even if imaging tests show changes in your back, this usually does not require surgery. HOME CARE INSTRUCTIONS For many people, back pain returns.Since low back pain is rarely dangerous, it is often a condition that people can learn to [facility] their own.   Remain active. It is stressful on the back to sit or stand in one place. Do not sit, drive, or stand in one place for more than 30 minutes at a time. Take short walks on level  surfaces as soon as pain allows.Try to increase the length of time you walk each day.  Do not stay in bed.Resting more than 1 or 2 days can delay your recovery.  Do not avoid exercise or work.Your body is made to move.It is not dangerous to be active, even though your back may hurt.Your back will likely heal faster if you return to being active before your pain is gone.  Pay attention to your body when you bend and lift. Many people have less discomfortwhen lifting if they bend their knees, keep the load close to their bodies,and avoid twisting. Often, the most comfortable positions are those that put less stress on your recovering back.  Find a comfortable position to sleep. Use a firm mattress and lie on your side with your knees slightly bent. If you lie on your back, put a pillow under your knees.  Only take over-the-counter or prescription medicines as directed by your caregiver. Over-the-counter medicines to reduce pain and inflammation are often the most helpful.Your caregiver may prescribe muscle relaxant drugs.These medicines help dull your pain so you can more quickly return to your normal activities and healthy exercise.  Put ice on the injured area.  Put ice in a plastic bag.  Place a towel between your skin and the bag.  Leave the ice on for 15-20 minutes, 03-04 times a day for the first 2 to 3 days. After that, ice and heat may  be alternated to reduce pain and spasms.  Ask your caregiver about trying back exercises and gentle massage. This may be of some benefit.  Avoid feeling anxious or stressed.Stress increases muscle tension and can worsen back pain.It is important to recognize when you are anxious or stressed and learn ways to manage it.Exercise is a great option. SEEK MEDICAL CARE IF:  You have pain that is not relieved with rest or medicine.  You have pain that does not improve in 1 week.  You have new symptoms.  You are generally not feeling  well. SEEK IMMEDIATE MEDICAL CARE IF:   You have pain that radiates from your back into your legs.  You develop new bowel or bladder control problems.  You have unusual weakness or numbness in your arms or legs.  You develop nausea or vomiting.  You develop abdominal pain.  You feel faint. Document Released: 08/21/2005 Document Revised: 02/20/2012 Document Reviewed: 12/23/2013 [facility] Patient Information 2015 [place], [place]. This information is not intended to replace advice given to you by your health care provider. Make sure you discuss any questions you have with your health care provider.

## 2015-02-05 NOTE — Progress Notes (Signed)
BP 107/72 mmHg  Pulse 79  Temp(Src) 98.1 F (36.7 C)  Wt 196 lb (88.905 kg)  SpO2 95%   Subjective:    Patient ID: [name], female    DOB: Jan 25, 1966, 49 y.o.   MRN: [id number]  HPI: [name] is a 49 y.o. female presenting on 02/05/2015 for Back Pain; Leg Pain; and Hip Pain  She is having lower back pain pain, left side, down the hip and down to the foot; she was supposed to have the leg and nerve study done; did not get done Pain has been going on since 26th or 27th of May Aching type pain Intensity is an 8 out of 10 Went to the hospital May 28th, seen in the ER; seen at [facility] no images done; labs done Cannot have MRI because of the bladder stimulator She has numbness that starts in the back and goes down the leg Hurts to sit and hurts to stand, especially when she walks Nothing alleviates pain Treatments attempted: lyrica and tramadol (but caused headache, so no more) Thinks she has a UTI, but no other B/B dysfunction Left leg feels like it's going to buckle Nothing similar in the past  Relevant past medical, surgical, family and social history reviewed and updated as indicated. Interim medical history since our last visit reviewed. Allergies and medications reviewed and updated.  Current Outpatient Prescriptions on File Prior to Visit  Medication Sig  . Choline Fenofibrate 45 MG capsule Take 45 mg by mouth daily.  . DULoxetine (CYMBALTA) 60 MG capsule Take 60 mg by mouth daily.  . insulin lispro (HUMALOG) 100 UNIT/ML injection Inject into the skin. Every 6 hours before meals, sliding scale  . insulin NPH Human (HUMULIN N,NOVOLIN N) 100 UNIT/ML injection Inject into the skin. 75 units in the am, 50 units in at lunch, 75 units at night  . lamoTRIgine (LAMICTAL) 150 MG tablet Take 150 mg by mouth at bedtime.  Marland Kitchen levothyroxine (SYNTHROID, LEVOTHROID) 75 MCG tablet Take 75 mcg by mouth daily before breakfast.  . magnesium oxide (MAG-OX) 400 MG tablet Take 400 mg by  mouth 3 (three) times daily.  . metFORMIN (GLUCOPHAGE-XR) 500 MG 24 hr tablet Take 500 mg by mouth. Take 2 tabs twice daily  . metoprolol tartrate (LOPRESSOR) 25 MG tablet Take 12.5 mg by mouth 2 (two) times daily.  Marland Kitchen olopatadine (PATANOL) 0.1 % ophthalmic solution Place 1 drop into both eyes 2 (two) times daily.  . pantoprazole (PROTONIX) 40 MG tablet Take 40 mg by mouth daily.  . QUEtiapine (SEROQUEL) 25 MG tablet Take 25 mg by mouth 2 (two) times daily. Take one in am, one at lunch  . QUEtiapine (SEROQUEL) 400 MG tablet Take 400 mg by mouth 2 (two) times daily. Take 2 tabs at bedtime  . quinapril (ACCUPRIL) 5 MG tablet Take 5 mg by mouth at bedtime.  . sucralfate (CARAFATE) 1 G tablet Take 1 g by mouth 4 (four) times daily.  . traZODone (DESYREL) 100 MG tablet Take 100 mg by mouth at bedtime.   No current facility-administered medications on file prior to visit.    Review of Systems  Constitutional: Negative for fever, chills and unexpected weight change.  Genitourinary: Positive for dysuria, urgency, frequency and difficulty urinating. Negative for hematuria and flank pain.  Musculoskeletal: Positive for back pain and joint swelling (left ankle).  Skin: Negative for rash.  Neurological: Positive for weakness (left leg) and numbness (left leg).    Per HPI unless  specifically indicated above     Objective:    BP 107/72 mmHg  Pulse 79  Temp(Src) 98.1 F (36.7 C)  Wt 196 lb (88.905 kg)  SpO2 95%  Wt Readings from Last 3 Encounters:  02/05/15 196 lb (88.905 kg)  01/30/15 196 lb (88.905 kg)  01/08/15 196 lb (88.905 kg)    Physical Exam  Constitutional: She appears well-developed and well-nourished.  Obese  Eyes: EOM are normal.  Cardiovascular: Normal rate, regular rhythm and normal heart sounds.   Musculoskeletal:       Left hip: She exhibits decreased range of motion and tenderness. She exhibits no bony tenderness and no deformity.       Left knee: She exhibits no  effusion and no deformity. Tenderness found. Lateral joint line tenderness noted. No medial joint line tenderness noted.       Left ankle: She exhibits decreased range of motion.       Lumbar back: She exhibits tenderness, bony tenderness and pain. She exhibits no swelling and no deformity.  Neurological: She is alert. She displays abnormal reflex. She displays no atrophy and no tremor. No sensory deficit. She exhibits normal muscle tone.  Reflex Scores:      Patellar reflexes are 0 on the right side and 0 on the left side. SLR positive on the right when seated  Psychiatric: Her mood appears not anxious. She does not exhibit a depressed mood.  Vitals reviewed.      Assessment & Plan:   Problem List Items Addressed This Visit    None    Visit Diagnoses    Left-sided low back pain without sciatica    -  Primary    order L-spine series; cannot take tylenol b/c liver; refer to PT and suggested turmeric; increase the lyrica    Relevant Medications    pregabalin (LYRICA) 75 MG capsule    Other Relevant Orders    Ambulatory referral to Physical Therapy    DG Lumbar Spine 2-3 Views    Hip pain, acute, left        refer to PT, turmeric, topical ice per pt instructions    Relevant Orders    Ambulatory referral to Physical Therapy    Acute knee pain, left        PT, turmeric, ice per pt instructions    Relevant Orders    Ambulatory referral to Physical Therapy    Ankle pain, left        good supportive shoes, PT, turmeric, ice    Relevant Orders    Ambulatory referral to Physical Therapy    Left leg weakness        with positive SLR on the left; order L spine films and follow the CT myelogram; pt politely declined referral to back doctor    Relevant Orders    Ambulatory referral to Physical Therapy    DG Lumbar Spine 2-3 Views    Abnormal reflexes of lower extremity        as above    Relevant Orders    Ambulatory referral to Physical Therapy    DG Lumbar Spine 2-3 Views         Follow up plan: Return in about 12 weeks (around 04/30/2015), or if symptoms worsen or fail to improve, for follow-up Lyrica.

## 2015-02-09 ENCOUNTER — Telehealth: Payer: Self-pay | Admitting: Family Medicine

## 2015-02-09 NOTE — Telephone Encounter (Signed)
I have not seen results of patient's lumbar spine xrays Did she go? If so, please track down report (I just don't see it) I see that she had some bronchitis and had CXRs done recently; we hope she is okay Thank you

## 2015-02-10 ENCOUNTER — Ambulatory Visit: Payer: Self-pay | Admitting: Family Medicine

## 2015-02-10 NOTE — Telephone Encounter (Signed)
Patient left message stating she was calling us back.

## 2015-02-10 NOTE — Telephone Encounter (Signed)
I called pt back and left her a message that we were calling her back in regards to her back xrays and that the order had been placed thru our system and that all she needed to do was go and have them done.

## 2015-02-15 ENCOUNTER — Telehealth: Payer: Self-pay | Admitting: Urgent Care

## 2015-02-15 NOTE — Telephone Encounter (Signed)
Pt needs appt with Dr Allen Norris Re: setup Hepatic protocol CT in Aug 2016 re: FU abnormal liver, NASH, compare with previous CTs Thanks

## 2015-02-15 NOTE — Telephone Encounter (Signed)
-----   Message from Otilio Jefferson sent at 02/15/2015  9:46 AM EDT ----- Regarding: hepatic protocol CT Sent: 04/09/2015       Office Visit: 11/17/2014      Pt needs hepatic protocol CT FU 49mo

## 2015-02-17 NOTE — Telephone Encounter (Signed)
Dr. Allen Norris would like to see patient in office first and then determine follow-up.   Appointment scheduled for: 04/07/15 at 0900am with Dr. Allen Norris in [place].  Pt informed of [place] Location and directions/address given at this time.

## 2015-02-18 ENCOUNTER — Telehealth: Payer: Self-pay

## 2015-02-18 DIAGNOSIS — J449 Chronic obstructive pulmonary disease, unspecified: Secondary | ICD-10-CM | POA: Diagnosis not present

## 2015-02-18 DIAGNOSIS — R0902 Hypoxemia: Secondary | ICD-10-CM | POA: Diagnosis not present

## 2015-02-18 DIAGNOSIS — G4733 Obstructive sleep apnea (adult) (pediatric): Secondary | ICD-10-CM | POA: Diagnosis not present

## 2015-02-18 NOTE — Progress Notes (Signed)
I sent note to [place]; I have not seen results; note sent to either get results or cancel if patient better

## 2015-02-18 NOTE — Telephone Encounter (Signed)
Left message for patient to call.

## 2015-02-18 NOTE — Telephone Encounter (Signed)
-----   Message from Arnetha Courser, MD sent at 02/18/2015 10:46 AM EDT ----- Regarding: Follow up on lumbar spine xray Please follow-up on the lumbar spine xray order; if she does not want it done and wants to cancel it, please cancel and document If she's had it done, please track it down Thank you, [name]   ----- Message -----    From: SYSTEM    Sent: 02/06/2015  12:04 AM      To: Arnetha Courser, MD

## 2015-02-23 NOTE — Telephone Encounter (Signed)
Left message to call.

## 2015-02-24 IMAGING — CR DG CHEST 2V
1 series · 2 of 2 positions shown · non-contrast
Comparison: 11/06/2012

CLINICAL DATA: COPD, pain wall breathing for 2 days, wears oxygen
at night

EXAM:
CHEST  2 VIEW

[Series 1: w chest pa · 0.14mm/px · 2 of 2 slices shown]
[im 1/2]
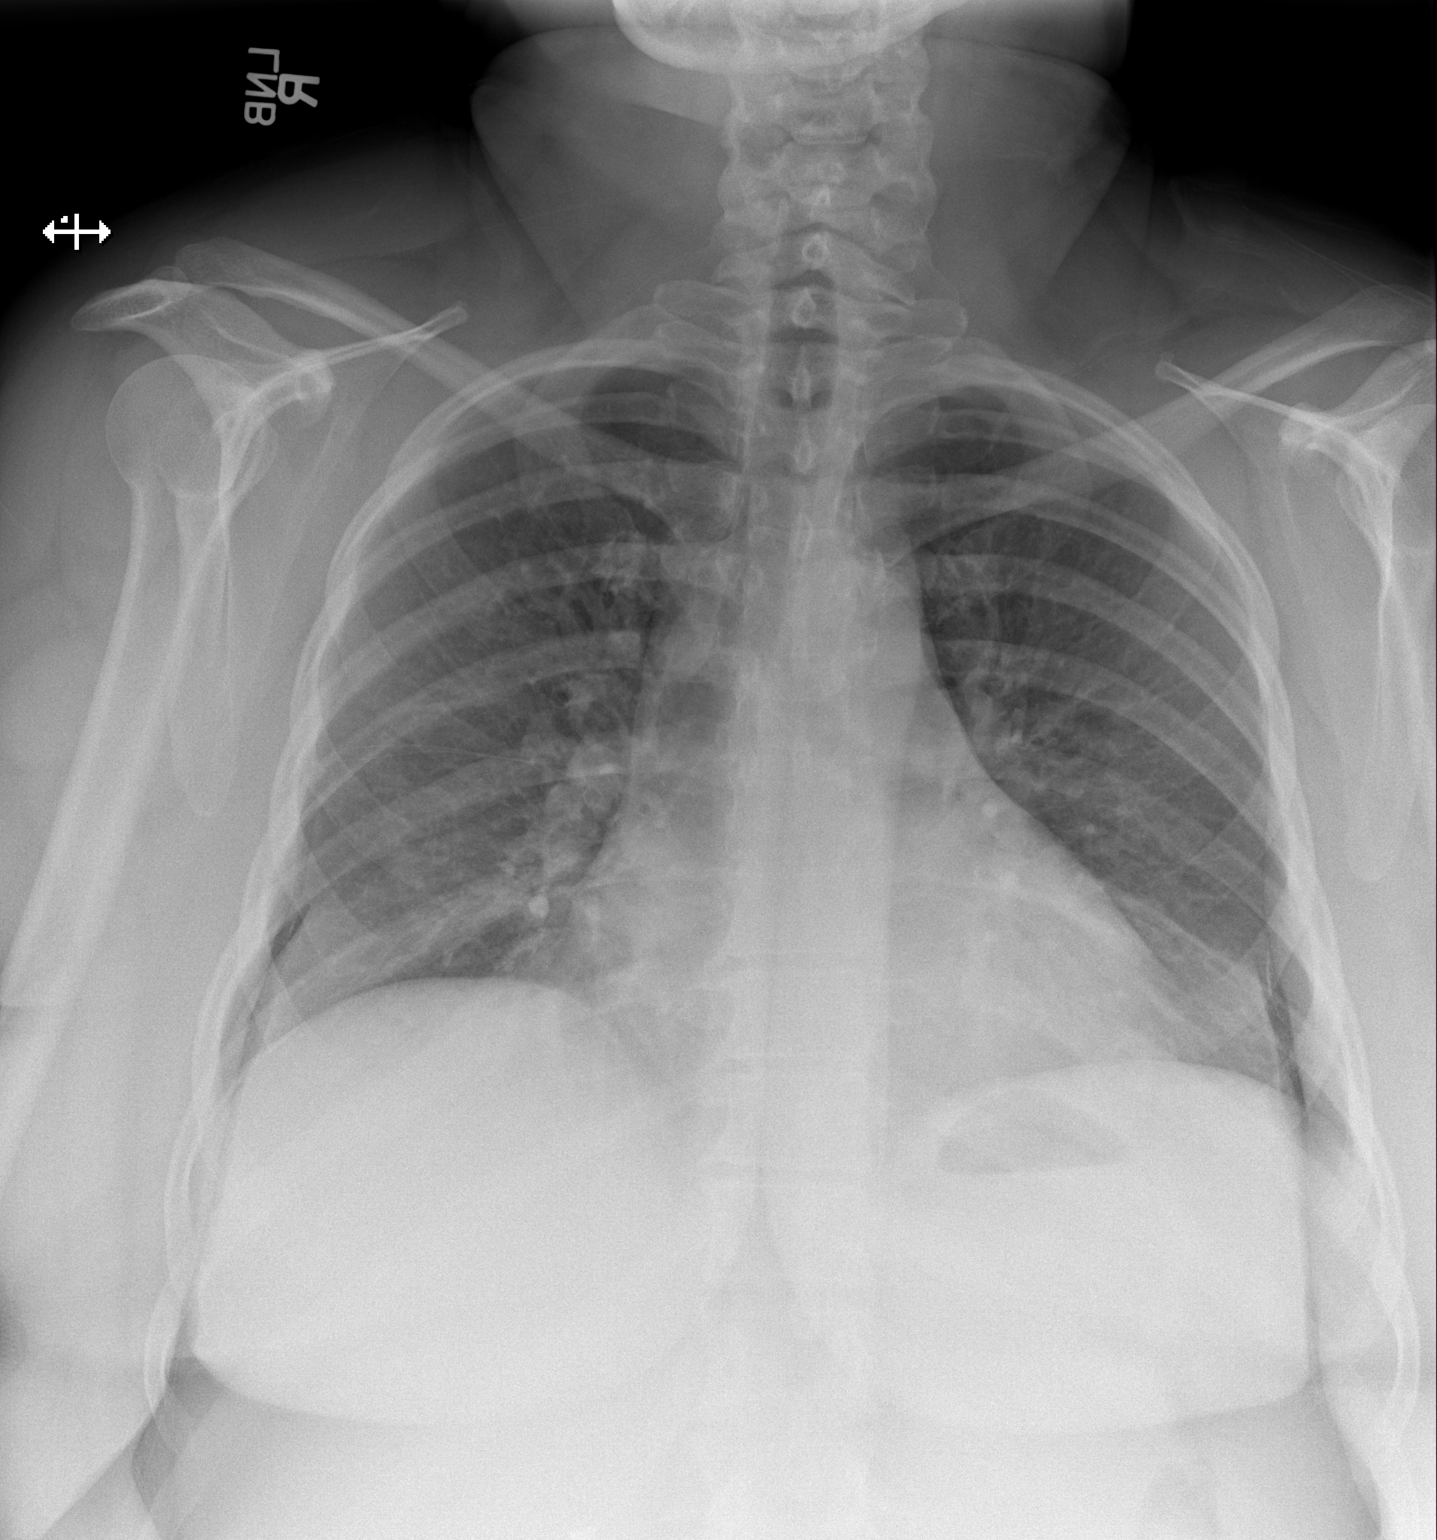
[im 2/2]
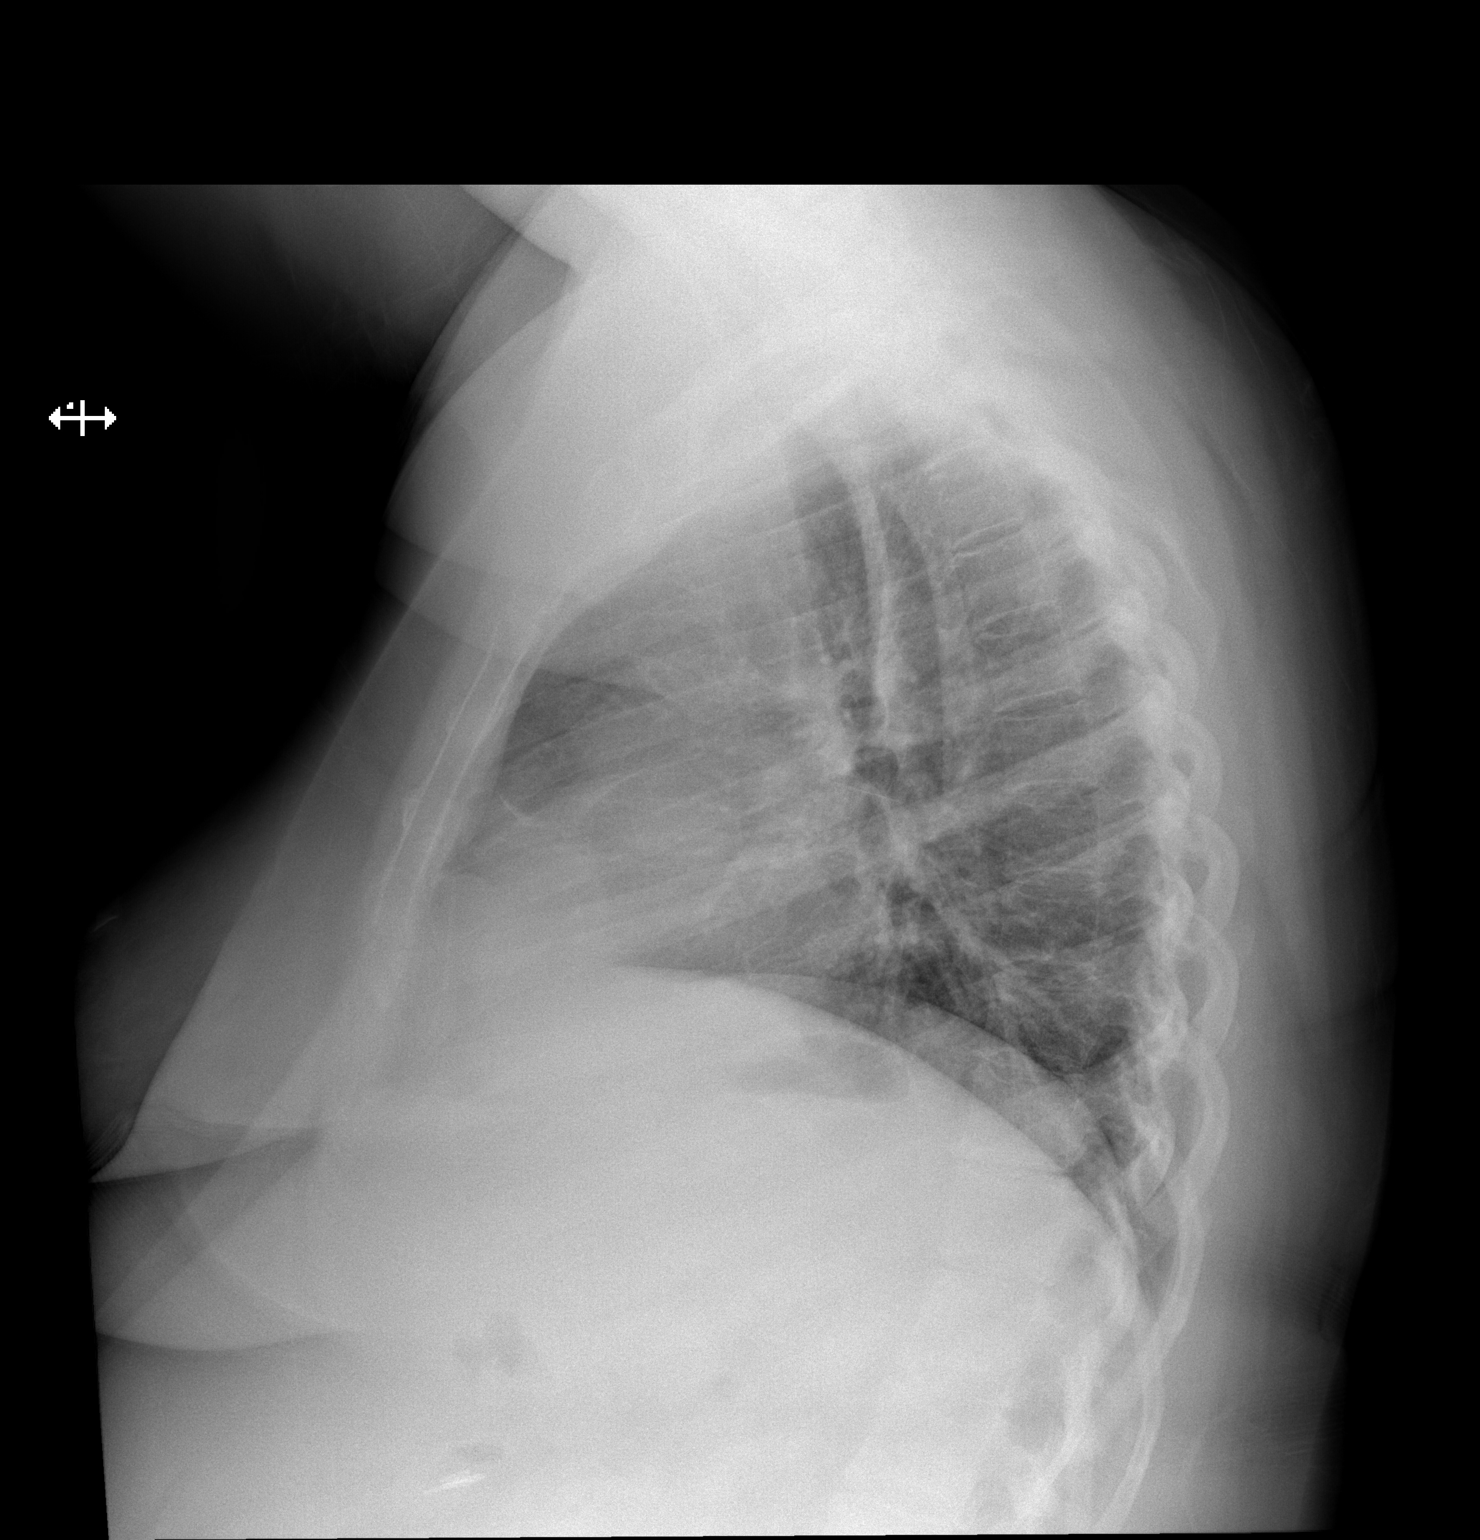

[2 of 2 positions shown; findings below may reference images not displayed]

FINDINGS: Minimal enlargement of cardiac silhouette with pulmonary vascular
congestion.

Mediastinal contours normal.

Peribronchial thickening without infiltrate, pleural effusion or
pneumothorax.

No acute osseous findings.
IMPRESSION: Minimal enlargement of cardiac silhouette with pulmonary vascular
congestion.

Minimal bronchitic changes without infiltrate.

## 2015-02-26 ENCOUNTER — Other Ambulatory Visit: Payer: Self-pay | Admitting: Family Medicine

## 2015-02-26 NOTE — Telephone Encounter (Signed)
[name], I have tried multiple times to reach her to ask her about the xray. I have gotten no call backs. Do you want to cancel the order?

## 2015-02-26 NOTE — Telephone Encounter (Signed)
Will cancel order

## 2015-03-02 ENCOUNTER — Ambulatory Visit
Admission: RE | Admit: 2015-03-02 | Discharge: 2015-03-02 | Disposition: A | Discharge status: Home / Self Care | Payer: Medicare Other | Source: Ambulatory Visit | Attending: Family Medicine | Admitting: Family Medicine

## 2015-03-02 DIAGNOSIS — M47816 Spondylosis without myelopathy or radiculopathy, lumbar region: Secondary | ICD-10-CM | POA: Diagnosis not present

## 2015-03-02 DIAGNOSIS — M47817 Spondylosis without myelopathy or radiculopathy, lumbosacral region: Secondary | ICD-10-CM | POA: Diagnosis not present

## 2015-03-02 DIAGNOSIS — M545 Low back pain: Secondary | ICD-10-CM | POA: Diagnosis not present

## 2015-03-03 ENCOUNTER — Encounter: Payer: Self-pay | Admitting: Family Medicine

## 2015-03-09 ENCOUNTER — Telehealth: Payer: Self-pay

## 2015-03-09 NOTE — Telephone Encounter (Signed)
Patient left message for me to call.

## 2015-03-09 NOTE — Telephone Encounter (Signed)
Patient had questions regarding her appointment with Neurology and wanted to schedule an appointment with [name].

## 2015-03-12 ENCOUNTER — Encounter: Payer: Self-pay | Admitting: Neurology

## 2015-03-12 ENCOUNTER — Ambulatory Visit (INDEPENDENT_AMBULATORY_CARE_PROVIDER_SITE_OTHER): Payer: Medicare Other | Admitting: Neurology

## 2015-03-12 VITALS — BP 128/78 | HR 86 | Ht 59.0 in | Wt 197.0 lb

## 2015-03-12 DIAGNOSIS — G43019 Migraine without aura, intractable, without status migrainosus: Secondary | ICD-10-CM | POA: Diagnosis not present

## 2015-03-12 DIAGNOSIS — M25569 Pain in unspecified knee: Secondary | ICD-10-CM | POA: Diagnosis not present

## 2015-03-12 DIAGNOSIS — G441 Vascular headache, not elsewhere classified: Secondary | ICD-10-CM | POA: Diagnosis not present

## 2015-03-12 DIAGNOSIS — M5442 Lumbago with sciatica, left side: Secondary | ICD-10-CM

## 2015-03-12 DIAGNOSIS — G43909 Migraine, unspecified, not intractable, without status migrainosus: Secondary | ICD-10-CM | POA: Insufficient documentation

## 2015-03-12 DIAGNOSIS — M549 Dorsalgia, unspecified: Secondary | ICD-10-CM | POA: Insufficient documentation

## 2015-03-12 MED ORDER — TRAMADOL HCL 50 MG PO TABS: 100.0000 mg | ORAL_TABLET | Freq: Four times a day (QID) | ORAL | Status: DC | PRN

## 2015-03-12 MED ORDER — TOPIRAMATE 25 MG PO TABS: 25.0000 mg | ORAL_TABLET | Freq: Two times a day (BID) | ORAL | Status: DC

## 2015-03-12 NOTE — Patient Instructions (Signed)
I had a long discussion with the patient and her husband regarding her leg and back pain which likely is of musculoskeletal etiology but lumbar radiculopathy is also a consideration. We also talked about her migraine headaches and advise her to avoid triggers and to take tramadol 100 mg at the earliest onset of her headaches. Discontinue Lyrica since it is not helping instead start Topamax 25 mg twice daily and if tolerated without side effects will increase further. Check CT scan of the head to look for structural or vascular lesions as well as EMG nerve conduction study to look for underlying radiculopathy or neuropathy. I also gave her back and neck stretching exercises and advised him to do them regularly. I also encouraged her to diet and exercise regularly and lose weight She was advised to return for follow-up in 2 months or call earlier if necessary. Migraine Headache A migraine headache is an intense, throbbing pain on one or both sides of your head. A migraine can last for 30 minutes to several hours. CAUSES  The exact cause of a migraine headache is not always known. However, a migraine may be caused when nerves in the brain become irritated and release chemicals that cause inflammation. This causes pain. Certain things may also trigger migraines, such as:  Alcohol.  Smoking.  Stress.  Menstruation.  Aged cheeses.  Foods or drinks that contain nitrates, glutamate, aspartame, or tyramine.  Lack of sleep.  Chocolate.  Caffeine.  Hunger.  Physical exertion.  Fatigue.  Medicines used to treat chest pain (nitroglycerine), birth control pills, estrogen, and some blood pressure medicines. SIGNS AND SYMPTOMS  Pain on one or both sides of your head.  Pulsating or throbbing pain.  Severe pain that prevents daily activities.  Pain that is aggravated by any physical activity.  Nausea, vomiting, or both.  Dizziness.  Pain with exposure to bright lights, loud noises, or  activity.  General sensitivity to bright lights, loud noises, or smells. Before you get a migraine, you may get warning signs that a migraine is coming (aura). An aura may include:  Seeing flashing lights.  Seeing bright spots, halos, or zigzag lines.  Having tunnel vision or blurred vision.  Having feelings of numbness or tingling.  Having trouble talking.  Having muscle weakness. DIAGNOSIS  A migraine headache is often diagnosed based on:  Symptoms.  Physical exam.  A CT scan or MRI of your head. These imaging tests cannot diagnose migraines, but they can help rule out other causes of headaches. TREATMENT Medicines may be given for pain and nausea. Medicines can also be given to help prevent recurrent migraines.  HOME CARE INSTRUCTIONS  Only take over-the-counter or prescription medicines for pain or discomfort as directed by your health care provider. The use of long-term narcotics is not recommended.  Lie down in a dark, quiet room when you have a migraine.  Keep a journal to find out what may trigger your migraine headaches. For example, write down:  What you eat and drink.  How much sleep you get.  Any change to your diet or medicines.  Limit alcohol consumption.  Quit smoking if you smoke.  Get 7-9 hours of sleep, or as recommended by your health care provider.  Limit stress.  Keep lights dim if bright lights bother you and make your migraines worse. SEEK IMMEDIATE MEDICAL CARE IF:   Your migraine becomes severe.  You have a fever.  You have a stiff neck.  You have vision loss.  You  have muscular weakness or loss of muscle control.  You start losing your balance or have trouble walking.  You feel faint or pass out.  You have severe symptoms that are different from your first symptoms. MAKE SURE YOU:   Understand these instructions.  Will watch your condition.  Will get help right away if you are not doing well or get worse. Document  Released: 08/21/2005 Document Revised: 01/05/2014 Document Reviewed: 04/28/2013 [facility] Patient Information 2015 [place], [place]. This information is not intended to replace advice given to you by your health care provider. Make sure you discuss any questions you have with your health care provider.

## 2015-03-12 NOTE — Progress Notes (Signed)
[facility] [address] [place]. [place] [address] [phone]       OFFICE CONSULT NOTE  [name] Date of Birth:  Apr 05, 1966 Medical Record Number:  [id number]   Referring MD:  Dr Enid Derry  Reason for Referral:  Leg pain Stacey headaches  HPI: [name] Yang a 49 year old African-American lady with a completed today by her husband. She reports 3 month history of multifocal symptoms starting initially with leg swelling followed by pain in her thighs Stacey subsequently the pain increasing becoming constant daily pain also involves the left lower paraspinal region she describes this Yang a dull pain which Yang continuous Stacey the more time she spends on her feet the pain seems to bother her more. She didn't denies any severe back pain Stacey radicular pain. She denies any bowel or bladder symptoms though she does have a bladder stimulator. At times she noticed Yang her balance Yang off Stacey she may run into walls but she has not fallen yet. She had x-rays of the lumbar spine done on 03/02/15 which I personally reviewed Stacey shows moderate facet hypertrophy on the left at L5-S1. She cannot have an MRI due to bladder stimulator. She also complains of headaches which sound like migraines in the last 1 month. She describes Yang a generalized throbbing 8/10 in severity complaint bad nausea light Stacey sound Stacey sensitivity Stacey relieved by lying down. The headaches increase his physical activity. She cannot take Tylenol due to fatty liver but she has tried taking gabapentin Stacey Lyrica which did not seem to help Yang much. She had tried tramadol years ago for arthritis Stacey was able to tolerate it. She denies any lack of sensation or  tingling.  ROS:   14 system review of systems Yang positive for fatigue, palpitations, murmur, leg swelling, blurred vision, snoring, anemia, bruising, increased thirst, joint pain Stacey swelling, aching muscles, allergies, memory loss, confusion, headache, weakness,  dizziness, anxiety, depression, too much sleep, decreased energy, change in appetite, Stacey disinterest in activities, racing thoughts, snoring Stacey restless legs PMH:  Past Medical History  Diagnosis Date  . Diabetes mellitus without complication     Type 2, insulin dependent  . Hyperlipidemia   . COPD (chronic obstructive pulmonary disease)   . Asthma   . Hypothyroid   . Morbid obesity   . OSA (obstructive sleep apnea)   . RLS (restless legs syndrome)   . GERD (gastroesophageal reflux disease)   . Bipolar depression   . Vitamin D deficiency   . Urinary incontinence   . Hypoxemia   . Liver lesion     Favored to be benign on CT; MRI of abd w/contract in Aug 2016. Liver Biopsy-Negative, March 2016  . Acute renal failure   . Hypomagnesemia   . Hypertension   . Headache     Social History:  History   Social History  . Marital Status: Married    Spouse Name: N/A  . Number of Children: 2  . Years of Education: 12   Occupational History  . disabled    Social History Main Topics  . Smoking status: Never Smoker   . Smokeless tobacco: Never Used  . Alcohol Use: No  . Drug Use: No  . Sexual Activity: Not on file   Other Topics Concern  . Not on file   Social History Narrative   16-20 oz of caffeine a day     Medications:   Current Outpatient Prescriptions on File Prior to  Visit  Medication Sig Dispense Refill  . Choline Fenofibrate 45 MG capsule Take 45 mg by mouth daily.    . DULoxetine (CYMBALTA) 60 MG capsule Take 60 mg by mouth 2 (two) times daily.     . insulin NPH Human (HUMULIN N,NOVOLIN N) 100 UNIT/ML injection Inject into the skin. 75 units in the am, 50 units in at lunch, 75 units at night    . levothyroxine (SYNTHROID, LEVOTHROID) 75 MCG tablet Take 75 mcg by mouth daily before breakfast.    . magnesium oxide (MAG-OX) 400 MG tablet Take 400 mg by mouth 3 (three) times daily.    . metFORMIN (GLUCOPHAGE-XR) 500 MG 24 hr tablet Take 500 mg by mouth. Take 2 tabs  twice daily    . metoprolol tartrate (LOPRESSOR) 25 MG tablet Take 12Stacey5 mg by mouth 2 (two) times daily.    Marland Kitchen olopatadine (PATANOL) 0Stacey1 % ophthalmic solution Place 1 drop into both eyes 2 (two) times daily.    . pantoprazole (PROTONIX) 40 MG tablet Take 40 mg by mouth daily.    . QUEtiapine (SEROQUEL) 25 MG tablet Take 25 mg by mouth 2 (two) times daily. Take one in am, one at lunch    . QUEtiapine (SEROQUEL) 400 MG tablet Take 400 mg by mouth 2 (two) times daily. Take 2 tabs at bedtime    . quinapril (ACCUPRIL) 5 MG tablet Take 5 mg by mouth at bedtime.    . sucralfate (CARAFATE) 1 G tablet Take 1 g by mouth 4 (four) times daily.    . traZODone (DESYREL) 100 MG tablet Take 100 mg by mouth at bedtime.    . insulin lispro (HUMALOG) 100 UNIT/ML injection Inject into the skin. Every 6 hours before meals, sliding scale    . lamoTRIgine (LAMICTAL) 150 MG tablet Take 150 mg by mouth at bedtime.    . pregabalin (LYRICA) 75 MG capsule Take 1 capsule (75 mg total) by mouth 3 (three) times daily. (Patient not taking: Reported on 03/12/2015) 90 capsule 2   No current facility-administered medications on file prior to visit.    Allergies:   Allergies  Allergen Reactions  . Codeine     REACTION: itch, rash  . Hydrocodone-Acetaminophen     REACTION: rash  . Mirtazapine     REACTION: closes throat  . Morphine Stacey Related   . Sulfamethoxazole-Trimethoprim     REACTION: Whelps all over    Physical Exam General: well developed, well nourished young African-American lady, seated, in no evident distress Head: head normocephalic Stacey atraumatic.   Neck: supple with no carotid or supraclavicular bruits Cardiovascular: regular rate Stacey rhythm, no murmurs Musculoskeletal: no deformity but mild spasm left lower paraspinal Stacey limited straight leg raising on the left to 30 due to muscle spasm Skin:  no rash/petichiae Vascular:  Normal pulses all extremities  Neurologic Exam Mental Status: Awake Stacey fully  alert. Oriented to place Stacey time. Recent Stacey remote memory intact. Attention span, concentration Stacey fund of knowledge appropriate. Mood Stacey affect appropriate.  Cranial Nerves: Fundoscopic exam reveals sharp disc margins. Pupils equal, briskly reactive to light. Extraocular movements full without nystagmus. Visual fields full to confrontation. Hearing intact. Facial sensation intact. Face, tongue, palate moves normally Stacey symmetrically.  Motor: Normal bulk Stacey tone. Normal strength in all tested extremity muscles. Sensory.: intact to touch , pinprick , position Stacey vibratory sensation.  Coordination: Rapid alternating movements normal in all extremities. Finger-to-nose Stacey heel-to-shin performed accurately bilaterally. Gait Stacey Station: Arises from  chair without difficulty. Stance Yang normal. Gait demonstrates normal stride length Stacey balance . Able to heel, toe Stacey tandem walk without difficulty.  Reflexes: 1+ Stacey symmetric except left knee jerk Stacey ankle jerks are depressed. Toes downgoing.       ASSESSMENT: 40 year African-American lady with 3 month history of multifocal complaints but mostly migraine headaches Yang well Yang muscular skeletal back Stacey leg pain. Neurological exam Yang fairly benign.     PLAN: I had a long discussion with the patient Stacey her husband regarding her leg Stacey back pain which likely Yang of musculoskeletal etiology but lumbar radiculopathy Yang also a consideration. We also talked about her migraine headaches Stacey advise her to avoid triggers Stacey to take tramadol 100 mg at the earliest onset of her headaches. Discontinue Lyrica since it Yang not helping instead start Topamax 25 mg twice daily Stacey if tolerated without side effects will increase further. Check CT scan of the head to look for structural or vascular lesions Yang well Yang EMG nerve conduction study to look for underlying radiculopathy or neuropathy. I also gave her back Stacey neck stretching exercises Stacey advised him to do  them regularly. I also encouraged her to diet Stacey exercise regularly Stacey lose weight She was advised to return for follow-up in 2 months or call earlier if necessary.   Note: This document was prepared with digital dictation Stacey possible smart phrase technology. Any transcriptional errors Yang result from this process are unintentional.

## 2015-03-15 ENCOUNTER — Telehealth: Payer: Self-pay | Admitting: Neurology

## 2015-03-15 ENCOUNTER — Encounter: Payer: Self-pay | Admitting: Family Medicine

## 2015-03-15 ENCOUNTER — Ambulatory Visit (INDEPENDENT_AMBULATORY_CARE_PROVIDER_SITE_OTHER): Payer: Medicare Other | Admitting: Family Medicine

## 2015-03-15 VITALS — BP 105/69 | HR 96 | Temp 97.4°F | Wt 191.0 lb

## 2015-03-15 DIAGNOSIS — E1122 Type 2 diabetes mellitus with diabetic chronic kidney disease: Secondary | ICD-10-CM | POA: Insufficient documentation

## 2015-03-15 DIAGNOSIS — D649 Anemia, unspecified: Secondary | ICD-10-CM | POA: Insufficient documentation

## 2015-03-15 DIAGNOSIS — L03211 Cellulitis of face: Secondary | ICD-10-CM | POA: Insufficient documentation

## 2015-03-15 DIAGNOSIS — N183 Chronic kidney disease, stage 3 unspecified: Secondary | ICD-10-CM | POA: Insufficient documentation

## 2015-03-15 DIAGNOSIS — R300 Dysuria: Secondary | ICD-10-CM

## 2015-03-15 DIAGNOSIS — E038 Other specified hypothyroidism: Secondary | ICD-10-CM | POA: Diagnosis not present

## 2015-03-15 MED ORDER — DOXYCYCLINE HYCLATE 100 MG PO TABS: 100.0000 mg | ORAL_TABLET | Freq: Two times a day (BID) | ORAL | Status: DC

## 2015-03-15 NOTE — Progress Notes (Signed)
BP 105/69 mmHg  Pulse 96  Temp(Src) 97.4 F (36.3 C)  Wt 191 lb (86.637 kg)  SpO2 96%   Subjective:    Patient ID: [name], female    DOB: [birth year], 49 y.o.   MRN: [id number]  HPI: [name] is a 49 y.o. female  Chief Complaint  Patient presents with  . Fatigue  . Hyperglycemia    was running 400 this am  Patient here for an acute visit; just doesn't feel good Sores on the chin; about a week, using bactroban not getting any better; no fevers; swollen lymph node Very tired all the time Abnormal thyroid test in February Sugars are running high; had been under good control until just recently; seeing endocrinologist for that Kidney function was not as good in May Liver enzymes reviewed and were better in May  Relevant past medical, surgical, family and social history reviewed and updated as indicated. Interim medical history since our last visit reviewed. Allergies and medications reviewed and updated.  Review of Systems  Per HPI unless specifically indicated above     Objective:    BP 105/69 mmHg  Pulse 96  Temp(Src) 97.4 F (36.3 C)  Wt 191 lb (86.637 kg)  SpO2 96%  Wt Readings from Last 3 Encounters:  03/16/15 186 lb (84.369 kg)  03/15/15 191 lb (86.637 kg)  03/12/15 197 lb (89.359 kg)    Physical Exam  Constitutional: She appears well-developed and well-nourished. No distress (appears tired, but nontoxic).  HENT:  Head: Normocephalic and atraumatic.  Nose: No rhinorrhea.  Mouth/Throat: Mucous membranes are not pale and not dry. No oral lesions. No oropharyngeal exudate.  Eyes: Right conjunctiva is not injected. Left conjunctiva is not injected. No scleral icterus.  Neck: No thyromegaly present.  Cardiovascular: Normal rate and regular rhythm.   Pulmonary/Chest: Effort normal and breath sounds normal. No respiratory distress.  Abdominal: She exhibits no distension (obese). There is no tenderness.  Lymphadenopathy:       Head (right side):  Submandibular (left) adenopathy present. No submental adenopathy present.  Skin: Lesion (confluent erythematous lesion across the chin, papular) noted.  Psychiatric: Her speech is normal and behavior is normal. Thought content normal. Her mood appears not anxious. Cognition and memory are not impaired. She does not exhibit a depressed mood.   Diabetic Foot Form - Detailed   Diabetic Foot Exam - detailed  Diabetic Foot exam was performed with the following findings:  Yes 03/15/2015  4:09 PM  Visual Foot Exam completed.:  Yes    Pulse Foot Exam completed.:  Yes  Right Dorsalis Pedis:  Present Left Dorsalis Pedis:  Present  Sensory Foot Exam Completed.:  Yes  Semmes-Weinstein Monofilament Test  R Site 1-Great Toe:  Pos (Comment: positive on dorsal surface, absent on plantar surface) L Site 1-Great Toe:  Pos (Comment: positive on dorsal surface, absent on plantar surface)    Comments:  Intact to vibration testing both great toes       Results for orders placed or performed in visit on 03/15/15  Microscopic Examination  Result Value Ref Range   WBC, UA 0-5 0 -  5 /hpf   RBC, UA 0-2 0 -  2 /hpf   Epithelial Cells (non renal) 0-10 0 - 10 /hpf   Casts Present (A) None seen /lpf   Cast Type Hyaline casts N/A   Bacteria, UA Few None seen/Few  Magnesium  Result Value Ref Range   Magnesium 1.1 (L) 1.6 - 2.3  mg/dL  CBC with Differential/Platelet  Result Value Ref Range   WBC 9.2 3.4 - 10.8 x10E3/uL   RBC 4.08 3.77 - 5.28 x10E6/uL   Hemoglobin 11.1 11.1 - 15.9 g/dL   Hematocrit 34.7 34.0 - 46.6 %   MCV 85 79 - 97 fL   MCH 27.2 26.6 - 33.0 pg   MCHC 32.0 31.5 - 35.7 g/dL   RDW 15.9 (H) 12.3 - 15.4 %   Platelets 261 150 - 379 x10E3/uL   NEUTROPHILS 60 %   Lymphs 30 %   Monocytes 7 %   Eos 1 %   Basos 0 %   Neutrophils Absolute 5.4 1.4 - 7.0 x10E3/uL   Lymphocytes Absolute 2.8 0.7 - 3.1 x10E3/uL   Monocytes Absolute 0.6 0.1 - 0.9 x10E3/uL   EOS (ABSOLUTE) 0.1 0.0 - 0.4 x10E3/uL    Basophils Absolute 0.0 0.0 - 0.2 x10E3/uL   Immature Granulocytes 2 %   Immature Grans (Abs) 0.2 (H) 0.0 - 0.1 x10E3/uL  Comprehensive metabolic panel  Result Value Ref Range   Glucose 343 (H) 65 - 99 mg/dL   BUN 17 6 - 24 mg/dL   Creatinine, Ser 1.17 (H) 0.57 - 1.00 mg/dL   GFR calc non Af Amer 54 (L) >59 mL/min/1.73   GFR calc Af Amer 62 >59 mL/min/1.73   BUN/Creatinine Ratio 15 9 - 23   Sodium 137 134 - 144 mmol/L   Potassium 5.1 3.5 - 5.2 mmol/L   Chloride 96 (L) 97 - 108 mmol/L   CO2 19 18 - 29 mmol/L   Calcium 10.9 (H) 8.7 - 10.2 mg/dL   Total Protein 6.3 6.0 - 8.5 g/dL   Albumin 4.2 3.5 - 5.5 g/dL   Globulin, Total 2.1 1.5 - 4.5 g/dL   Albumin/Globulin Ratio 2.0 1.1 - 2.5   Bilirubin Total 0.2 0.0 - 1.2 mg/dL   Alkaline Phosphatase 93 39 - 117 IU/L   AST 15 0 - 40 IU/L   ALT 26 0 - 32 IU/L  UA/M w/rflx Culture, Routine  Result Value Ref Range   Specific Gravity, UA 1.030 1.005 - 1.030   pH, UA 5.0 5.0 - 7.5   Color, UA Yellow Yellow   Appearance Ur Cloudy (A) Clear   Leukocytes, UA Negative Negative   Protein, UA 1+ (A) Negative/Trace   Glucose, UA 3+ (A) Negative   Ketones, UA 1+ (A) Negative   RBC, UA Negative Negative   Bilirubin, UA Negative Negative   Urobilinogen, Ur 0.2 0.2 - 1.0 mg/dL   Nitrite, UA Positive (A) Negative   Microscopic Examination See below:    Urinalysis Reflex Comment   TSH  Result Value Ref Range   TSH 1.080 0.450 - 4.500 uIU/mL      Assessment & Plan:   Problem List Items Addressed This Visit      Endocrine   Hypothyroid    Check thyroid function      Relevant Orders   TSH (Completed)     Genitourinary   Chronic kidney disease, stage III (moderate)    Check labs      Relevant Orders   Comprehensive metabolic panel (Completed)     Other   Hypomagnesemia    Check level      Relevant Orders   Magnesium (Completed)   Anemia   Relevant Orders   CBC with Differential/Platelet (Completed)   Cellulitis of face    Relevant Medications   doxycycline (VIBRA-TABS) 100 MG tablet    Other Visit  Diagnoses    Dysuria    -  Primary    Relevant Orders    UA/M w/rflx Culture, Routine (Completed)        Follow up plan: Return in about 3 weeks (around 04/05/2015).

## 2015-03-15 NOTE — Assessment & Plan Note (Signed)
Check labs 

## 2015-03-15 NOTE — Patient Instructions (Addendum)
Stop quinapril for now [facility] contact you with lab results (make sure we have updated phone number) Start the new antibiotic doxycycline Okay to use topical antibiotic on the chin if needed Don't go barefoot Check feet every night Return in August, but call sooner if needed

## 2015-03-15 NOTE — Telephone Encounter (Signed)
Spoke to [place]. Anderson Malta just needed Rx for Tramadol to be faxed to her facility for patient to have med filled. I contacted Help Desk in reference to merging other chart listed for this patient with same name, address, and dob.

## 2015-03-15 NOTE — Telephone Encounter (Signed)
Anderson Malta called regarding a medication that was supposed to be sent over to them. She states that the patient said we were supposed to send a few medications over, but they do not know which medications. Please call and advise.

## 2015-03-15 NOTE — Assessment & Plan Note (Signed)
Check thyroid function

## 2015-03-15 NOTE — Assessment & Plan Note (Signed)
Check level 

## 2015-03-16 ENCOUNTER — Ambulatory Visit
Admission: RE | Admit: 2015-03-16 | Discharge: 2015-03-16 | Disposition: A | Discharge status: Home / Self Care | Payer: Medicare Other | Source: Ambulatory Visit | Attending: Neurology | Admitting: Neurology

## 2015-03-16 ENCOUNTER — Encounter: Payer: Self-pay | Admitting: Emergency Medicine

## 2015-03-16 ENCOUNTER — Emergency Department
Admission: EM | Admit: 2015-03-16 | Discharge: 2015-03-16 | Disposition: A | Discharge status: Home / Self Care | Payer: Medicare Other | Attending: Emergency Medicine | Admitting: Emergency Medicine

## 2015-03-16 ENCOUNTER — Encounter: Payer: Self-pay | Admitting: Family Medicine

## 2015-03-16 ENCOUNTER — Telehealth: Payer: Self-pay | Admitting: Family Medicine

## 2015-03-16 DIAGNOSIS — N183 Chronic kidney disease, stage 3 (moderate): Secondary | ICD-10-CM | POA: Diagnosis not present

## 2015-03-16 DIAGNOSIS — E119 Type 2 diabetes mellitus without complications: Secondary | ICD-10-CM | POA: Diagnosis not present

## 2015-03-16 DIAGNOSIS — G441 Vascular headache, not elsewhere classified: Secondary | ICD-10-CM | POA: Diagnosis not present

## 2015-03-16 DIAGNOSIS — R531 Weakness: Secondary | ICD-10-CM

## 2015-03-16 DIAGNOSIS — R799 Abnormal finding of blood chemistry, unspecified: Secondary | ICD-10-CM | POA: Diagnosis present

## 2015-03-16 DIAGNOSIS — R51 Headache: Secondary | ICD-10-CM | POA: Insufficient documentation

## 2015-03-16 DIAGNOSIS — I129 Hypertensive chronic kidney disease with stage 1 through stage 4 chronic kidney disease, or unspecified chronic kidney disease: Secondary | ICD-10-CM | POA: Insufficient documentation

## 2015-03-16 HISTORY — DX: Heart failure, unspecified: I50.9

## 2015-03-16 LAB — COMPREHENSIVE METABOLIC PANEL
A/G RATIO: 2 (ref 1.1–2.5)
ALK PHOS: 93 IU/L (ref 39–117)
ALT: 26 IU/L (ref 0–32)
ALT: 27 U/L (ref 14–54)
ANION GAP: 10 (ref 5–15)
AST: 15 IU/L (ref 0–40)
AST: 28 U/L (ref 15–41)
Albumin: 4.2 g/dL (ref 3.5–5.5)
Albumin: 3.8 g/dL (ref 3.5–5.0)
Alkaline Phosphatase: 73 U/L (ref 38–126)
BILIRUBIN TOTAL: 0.2 mg/dL (ref 0.0–1.2)
BUN/Creatinine Ratio: 15 (ref 9–23)
BUN: 17 mg/dL (ref 6–24)
BUN: 21 mg/dL — ABNORMAL HIGH (ref 6–20)
CALCIUM: 10.9 mg/dL — AB (ref 8.7–10.2)
CO2: 19 mmol/L (ref 18–29)
CO2: 23 mmol/L (ref 22–32)
CREATININE: 1.17 mg/dL — AB (ref 0.57–1.00)
Calcium: 10.3 mg/dL (ref 8.9–10.3)
Chloride: 96 mmol/L — ABNORMAL LOW (ref 97–108)
Chloride: 103 mmol/L (ref 101–111)
Creatinine, Ser: 1.08 mg/dL — ABNORMAL HIGH (ref 0.44–1.00)
GFR calc non Af Amer: 54 mL/min/{1.73_m2} — ABNORMAL LOW (ref >59)
GFR calc non Af Amer: 58 mL/min — ABNORMAL LOW (ref >60)
GFR, EST AFRICAN AMERICAN: 62 mL/min/{1.73_m2} (ref >59)
Globulin, Total: 2.1 g/dL (ref 1.5–4.5)
Glucose, Bld: 181 mg/dL — ABNORMAL HIGH (ref 65–99)
Glucose: 343 mg/dL — ABNORMAL HIGH (ref 65–99)
Potassium: 5.1 mmol/L (ref 3.5–5.2)
Potassium: 4.6 mmol/L (ref 3.5–5.1)
SODIUM: 137 mmol/L (ref 134–144)
Sodium: 136 mmol/L (ref 135–145)
Total Bilirubin: 0.4 mg/dL (ref 0.3–1.2)
Total Protein: 6.3 g/dL (ref 6.0–8.5)
Total Protein: 6.4 g/dL — ABNORMAL LOW (ref 6.5–8.1)

## 2015-03-16 LAB — CBC WITH DIFFERENTIAL/PLATELET
BASOS: 0 %
Basophils Absolute: 0 10*3/uL (ref 0.0–0.2)
Basophils Absolute: 0.1 10*3/uL (ref 0–0.1)
Basophils Relative: 1 %
EOS (ABSOLUTE): 0.1 10*3/uL (ref 0.0–0.4)
EOS ABS: 0.2 10*3/uL (ref 0–0.7)
EOS PCT: 2 %
Eos: 1 %
HCT: 34.6 % — ABNORMAL LOW (ref 35.0–47.0)
HEMOGLOBIN: 11.1 g/dL (ref 11.1–15.9)
HEMOGLOBIN: 11.5 g/dL — AB (ref 12.0–16.0)
Hematocrit: 34.7 % (ref 34.0–46.6)
Immature Grans (Abs): 0.2 10*3/uL — ABNORMAL HIGH (ref 0.0–0.1)
Immature Granulocytes: 2 %
LYMPHS ABS: 2.8 10*3/uL (ref 0.7–3.1)
LYMPHS ABS: 2.7 10*3/uL (ref 1.0–3.6)
LYMPHS: 30 %
Lymphocytes Relative: 31 %
MCH: 27.2 pg (ref 26.6–33.0)
MCH: 27.8 pg (ref 26.0–34.0)
MCHC: 32 g/dL (ref 31.5–35.7)
MCHC: 33.3 g/dL (ref 32.0–36.0)
MCV: 85 fL (ref 79–97)
MCV: 83.7 fL (ref 80.0–100.0)
MONOCYTES: 7 %
Monocytes Absolute: 0.6 10*3/uL (ref 0.1–0.9)
Monocytes Absolute: 0.7 10*3/uL (ref 0.2–0.9)
Monocytes Relative: 8 %
NEUTROS ABS: 5.4 10*3/uL (ref 1.4–7.0)
Neutro Abs: 5 10*3/uL (ref 1.4–6.5)
Neutrophils Relative %: 58 %
Neutrophils: 60 %
PLATELETS: 272 10*3/uL (ref 150–440)
Platelets: 261 10*3/uL (ref 150–379)
RBC: 4.08 x10E6/uL (ref 3.77–5.28)
RBC: 4.14 MIL/uL (ref 3.80–5.20)
RDW: 15.9 % — ABNORMAL HIGH (ref 12.3–15.4)
RDW: 16.2 % — ABNORMAL HIGH (ref 11.5–14.5)
WBC: 9.2 10*3/uL (ref 3.4–10.8)
WBC: 8.7 10*3/uL (ref 3.6–11.0)

## 2015-03-16 LAB — MICROSCOPIC EXAMINATION

## 2015-03-16 LAB — MAGNESIUM
MAGNESIUM: 1.1 mg/dL — AB (ref 1.6–2.3)
Magnesium: 1.1 mg/dL — ABNORMAL LOW (ref 1.7–2.4)

## 2015-03-16 LAB — URINALYSIS COMPLETE WITH MICROSCOPIC (ARMC ONLY)
Bacteria, UA: NONE SEEN
Bilirubin Urine: NEGATIVE
Glucose, UA: NEGATIVE mg/dL
Hgb urine dipstick: NEGATIVE
Ketones, ur: NEGATIVE mg/dL
LEUKOCYTES UA: NEGATIVE
Nitrite: NEGATIVE
PROTEIN: NEGATIVE mg/dL
Specific Gravity, Urine: 1.034 — ABNORMAL HIGH (ref 1.005–1.030)
pH: 5 (ref 5.0–8.0)

## 2015-03-16 LAB — TROPONIN I

## 2015-03-16 LAB — TSH: TSH: 1.08 u[IU]/mL (ref 0.450–4.500)

## 2015-03-16 MED ORDER — SODIUM CHLORIDE 0.9 % IV SOLN
Freq: Once | INTRAVENOUS | Status: AC
  Administered 2015-03-16: 13:00:00 via INTRAVENOUS

## 2015-03-16 MED ORDER — MAGNESIUM SULFATE 2 GM/50ML IV SOLN
2.0000 g | Freq: Once | INTRAVENOUS | Status: AC
  Administered 2015-03-16: 2 g via INTRAVENOUS
  Filled 2015-03-16: qty 50

## 2015-03-16 MED ORDER — IOHEXOL 300 MG/ML  SOLN
75.0000 mL | Freq: Once | INTRAMUSCULAR | Status: AC | PRN
  Administered 2015-03-16: 100 mL via INTRAVENOUS

## 2015-03-16 NOTE — Telephone Encounter (Signed)
I spoke with patient's husband and then the patient Her magnesium is rock bottom at 1.1 This is the lowest hers has ever been She also has an elevated calcium level I'm sending her to the ER for evaluation, recheck, and infusion if the ER doctor approved Explained to patient that with her very low Mg2+ and high Ca2+, needs to be addressed today I am out of the office the rest of the day

## 2015-03-16 NOTE — ED Notes (Signed)
Ambulated to the BR.  Steady Gait.  NAD

## 2015-03-16 NOTE — ED Provider Notes (Addendum)
-----------------------------------------   5:30 PM on 03/16/2015 -----------------------------------------  Assuming care from Dr. Jimmye Norman.  In short, [name] is a 49 y.o. female with a chief complaint of Abnormal Lab .  Refer to the original H&P for additional details.  Patient's labs have resulted in normal results. I discussed with the patient, she plans to follow up with her primary care doctor regarding her lower magnesium. Patient has received magnesium in the emergency department. We will discharge the patient on magnesium supplements, and have her follow-up with her primary care doctor. Patient agreeable.    ----------------------------------------- 5:32 PM on 03/16/2015 -----------------------------------------  Patient states she currently takes magnesium twice daily, she will continue to do so and follow up with her primary care doctor.   Harvest Dark, MD 03/16/15 1731  Harvest Dark, MD 03/16/15 [phone]

## 2015-03-16 NOTE — ED Notes (Signed)
Pt sent here by PCP for abnormal lab work. Pt had lab work done yesterday, reports low magnesium and "electrolytes are all messed up, maybe my potassium is high". Pt was started on a new medication yesterday, unsure of what it is. Pt alert and oriented in room, no distress noted.

## 2015-03-16 NOTE — Telephone Encounter (Signed)
She also has ketones in her urine and is diabetic

## 2015-03-16 NOTE — ED Notes (Signed)
AOx3.  Skin warm and dry.  NAD.  Ambulates with easy and steady gait.

## 2015-03-16 NOTE — Discharge Instructions (Signed)
Hypomagnesemia Magnesium is a common ion (mineral) in the body which is needed for metabolism. It is about how the body handles food and other chemical reactions necessary for life. Only about 2% of the magnesium in our body is found in the blood. When this is low, it is called hypomagnesemia. The blood will measure only a tiny amount of the magnesium in our body. When it is low in our blood, it does not mean that the whole body supply is low. The normal serum concentration ranges from 1.8-2.5 mEq/L. When the level gets to be less than 1.0 mEq/L, a number of problems begin to happen.  CAUSES   Receiving intravenous fluids without magnesium replacement.  Loss of magnesium from the bowel by nasogastric suction.  Loss of magnesium from nausea and vomiting or severe diarrhea. Any of the inflammatory bowel conditions can cause this.  Abuse of alcohol often leads to low serum magnesium.  An inherited form of magnesium loss happens when the kidneys lose magnesium. This is called familial or primary hypomagnesemia.  Some medications such as diuretics also cause the loss of magnesium. SYMPTOMS  These following problems are worse if the changes in magnesium levels come on suddenly.  Tremor.  Confusion.  Muscle weakness.  Oversensitive to sights and sounds.  Sensitive reflexes.  Depression.  Muscular fibrillations.  Overreactivity of the nerves.  Irritability.  Psychosis.  Spasms of the hand muscles.  Tetany (where the muscles go into uncontrollable spasms). DIAGNOSIS  This condition can be diagnosed by blood tests. TREATMENT   In an emergency, magnesium can be given intravenously (by vein).  If the condition is less worrisome, it can be corrected by diet. High levels of magnesium are found in green leafy vegetables, peas, beans, and nuts among other things. It can also be given through medications by mouth.  If it is being caused by medications, changes can be made.  If  alcohol is a problem, help is available if there are difficulties giving it up. Document Released: 05/17/2005 Document Revised: 01/05/2014 Document Reviewed: 04/10/2008 [facility] Patient Information 2015 [place], [place]. This information is not intended to replace advice given to you by your health care provider. Make sure you discuss any questions you have with your health care provider.  Weakness Weakness is a lack of strength. It may be felt all over the body (generalized) or in one specific part of the body (focal). Some causes of weakness can be serious. You may need further medical evaluation, especially if you are elderly or you have a history of immunosuppression (such as chemotherapy or HIV), kidney disease, heart disease, or diabetes. CAUSES  Weakness can be caused by many different things, including:  Infection.  Physical exhaustion.  Internal bleeding or other blood loss that results in a lack of red blood cells (anemia).  Dehydration. This cause is more common in elderly people.  Side effects or electrolyte abnormalities from medicines, such as pain medicines or sedatives.  Emotional distress, anxiety, or depression.  Circulation problems, especially severe peripheral arterial disease.  Heart disease, such as rapid atrial fibrillation, bradycardia, or heart failure.  Nervous system disorders, such as Guillain-Barr syndrome, multiple sclerosis, or stroke. DIAGNOSIS  To find the cause of your weakness, your caregiver will take your history and perform a physical exam. Lab tests or X-rays may also be ordered, if needed. TREATMENT  Treatment of weakness depends on the cause of your symptoms and can vary greatly. HOME CARE INSTRUCTIONS   Rest as needed.  Eat  a well-balanced diet.  Try to get some exercise every day.  Only take over-the-counter or prescription medicines as directed by your caregiver. SEEK MEDICAL CARE IF:   Your weakness seems to be getting worse or  spreads to other parts of your body.  You develop new aches or pains. SEEK IMMEDIATE MEDICAL CARE IF:   You cannot perform your normal daily activities, such as getting dressed and feeding yourself.  You cannot walk up and down stairs, or you feel exhausted when you do so.  You have shortness of breath or chest pain.  You have difficulty moving parts of your body.  You have weakness in only one area of the body or on only one side of the body.  You have a fever.  You have trouble speaking or swallowing.  You cannot control your bladder or bowel movements.  You have black or bloody vomit or stools. MAKE SURE YOU:  Understand these instructions.  Will watch your condition.  Will get help right away if you are not doing well or get worse. Document Released: 08/21/2005 Document Revised: 02/20/2012 Document Reviewed: 10/20/2011 [facility] Patient Information 2015 [place], [place]. This information is not intended to replace advice given to you by your health care provider. Make sure you discuss any questions you have with your health care provider.

## 2015-03-16 NOTE — ED Provider Notes (Signed)
[facility] Emergency Department Provider Note     Time seen: ----------------------------------------- 12:48 PM on 03/16/2015 -----------------------------------------    I have reviewed the triage vital signs and the nursing notes.   HISTORY  Chief Complaint Abnormal Lab    HPI [name] is a 49 y.o. female who presents to ER for abnormal lab work. Patient reports she was seen by her primary care doctor yesterday and had lab work drawn there. She reports she was called and told that her magnesium and lites lites were low that she needs to be on a cardiac monitor. Patient just complains of weakness with some diarrhea otherwise denies any complaints. States she has had low magnesium before and she is taking oral magnesium twice daily currently.   Past Medical History  Diagnosis Date  . High blood pressure   . Heart attack   . Irregular heart rhythm   . Seasonal allergies   . Chronic headaches   . Sleep apnea   . Hyperlipidemia   . COPD (chronic obstructive pulmonary disease)   . Asthma   . Hypothyroid   . Morbid obesity   . OSA (obstructive sleep apnea)   . RLS (restless legs syndrome)   . GERD (gastroesophageal reflux disease)   . Bipolar depression   . Vitamin D deficiency   . Urinary incontinence   . Hypoxemia   . Liver lesion     Favored to be benign on CT; MRI of abd w/contract in Aug 2016. Liver Biopsy-Negative, March 2016  . Acute renal failure   . Hypomagnesemia   . Hypertension   . Headache   . Diabetes mellitus without complication     Type 2, insulin dependent. Patient also takes Metformin.  . CHF (congestive heart failure)     Patient Active Problem List   Diagnosis Date Noted  . Chronic kidney disease, stage III (moderate) 03/15/2015  . Anemia 03/15/2015  . Cellulitis of face 03/15/2015  . Migraine 03/12/2015  . Backache 03/12/2015  . COPD (chronic obstructive pulmonary disease) 01/20/2015  . Selective deficiency  of IgG 01/20/2015  . GERD (gastroesophageal reflux disease) 01/20/2015  . Disorder of smooth muscle 01/20/2015  . Hepatic encephalopathy 01/20/2015  . Hypothyroid   . Morbid obesity   . OSA (obstructive sleep apnea)   . RLS (restless legs syndrome)   . Bipolar depression   . Vitamin D deficiency   . Urinary incontinence   . Liver lesion   . Acute renal failure   . Hypomagnesemia   . Dyspnea 10/20/2013  . Obstructive sleep apnea 10/20/2013  . PERIPHERAL NEUROPATHY 03/04/2009  . RENAL INSUFFICIENCY 03/04/2009  . HEMORRHOIDS, EXTERNAL 09/03/2008  . MORBID OBESITY 10/14/2007  . ALLERGIC RHINITIS 07/04/2007  . ASTHMA, PERSISTENT, MODERATE 07/04/2007  . INTERSTITIAL CYSTITIS 07/04/2007  . HYPERTRIGLYCERIDEMIA 07/03/2007  . FATIGUE, CHRONIC 07/03/2007  . BIPOLAR AFFECTIVE DISORDER, HX OF 07/03/2007  . DM 06/19/2007  . HYPERTENSION, BENIGN ESSENTIAL 06/19/2007    Past Surgical History  Procedure Laterality Date  . Abdominal hysterectomy    . Tubal ligation    . Liver biopsy  March 2016    Negative  . Abdominal hysterectomy      Partial  . Cholecystectomy    . Bladder surgery      x 2  . Lung biopsy      Allergies Morphine and related; Codeine; Codeine; Hydrocodone-acetaminophen; Mirtazapine; Morphine and related; Sulfamethoxazole-trimethoprim; and Vicodin  Social History History  Substance Use Topics  . Smoking status:  Never Smoker   . Smokeless tobacco: Never Used  . Alcohol Use: No    Review of Systems Constitutional: Negative for fever. Eyes: Negative for visual changes. ENT: Negative for sore throat. Cardiovascular: Negative for chest pain. Respiratory: Negative for shortness of breath. Gastrointestinal: Negative for abdominal pain, positive for diarrhea Genitourinary: Negative for dysuria. Musculoskeletal: Negative for back pain. Skin: Negative for rash. Neurological: Negative for headaches, positive for weakness  10-point ROS otherwise  negative.  ____________________________________________   PHYSICAL EXAM:  VITAL SIGNS: ED Triage Vitals  Enc Vitals Group     BP 03/16/15 1245 125/69 mmHg     Pulse Rate 03/16/15 1245 92     Resp 03/16/15 1245 19     Temp 03/16/15 1245 98.1 F (36.7 C)     Temp Source 03/16/15 1245 Oral     SpO2 03/16/15 1245 98 %     Weight 03/16/15 1245 191 lb (86.637 kg)     Height 03/16/15 1245 4' 11"  (1.499 m)     Head Cir --      Peak Flow --      Pain Score --      Pain Loc --      Pain Edu? --      Excl. in [place]? --     Constitutional: Alert and oriented. Well appearing and in no distress. Eyes: Conjunctivae are normal. PERRL. Normal extraocular movements. ENT   Head: Normocephalic and atraumatic.   Nose: No congestion/rhinnorhea.   Mouth/Throat: Mucous membranes are moist.   Neck: No stridor. Hematological/Lymphatic/Immunilogical: No cervical lymphadenopathy. Cardiovascular: Normal rate, regular rhythm. Normal and symmetric distal pulses are present in all extremities. No murmurs, rubs, or gallops. Respiratory: Normal respiratory effort without tachypnea nor retractions. Breath sounds are clear and equal bilaterally. No wheezes/rales/rhonchi. Gastrointestinal: Soft and nontender. No distention. No abdominal bruits. There is no CVA tenderness. Musculoskeletal: Nontender with normal range of motion in all extremities. No joint effusions.  No lower extremity tenderness nor edema. Neurologic:  Normal speech and language. No gross focal neurologic deficits are appreciated. Speech is normal. No gait instability. Skin:  Skin is warm, dry and intact. No rash noted. Psychiatric: Mood and affect are normal. Speech and behavior are normal. Patient exhibits appropriate insight and judgment. ____________________________________________  EKG: Interpreted by me. Normal sinus rhythm with normal axis, normal intervals. No   _ evidence of hypertrophy or acute infarction. Rate is 94  bpm___________________________________________  ED COURSE:  Pertinent labs & imaging results that were available during my care of the patient were reviewed by me and considered in my medical decision making (see chart for details).  we will recheck basic labs and review old labs. Plan for magnesium infusion ____________________________________________    LABS (pertinent positives/negatives)  Labs Reviewed  CBC WITH DIFFERENTIAL/PLATELET - Abnormal; Notable for the following:    Hemoglobin 11.5 (*)    HCT 34.6 (*)    RDW 16.2 (*)    All other components within normal limits  COMPREHENSIVE METABOLIC PANEL - Abnormal; Notable for the following:    Glucose, Bld 181 (*)    BUN 21 (*)    Creatinine, Ser 1.08 (*)    Total Protein 6.4 (*)    GFR calc non Af Amer 58 (*)    All other components within normal limits  MAGNESIUM - Abnormal; Notable for the following:    Magnesium 1.1 (*)    All other components within normal limits  URINALYSIS COMPLETEWITH MICROSCOPIC ([facility] ONLY) - Abnormal; Notable for  the following:    Color, Urine YELLOW (*)    APPearance CLEAR (*)    Specific Gravity, Urine 1.034 (*)    Squamous Epithelial / LPF 0-5 (*)    All other components within normal limits  TROPONIN I    RADIOLOGY  none  ____________________________________________  FINAL ASSESSMENT AND PLAN  Weakness, hypomagnesemia   Plan: Patient with labs as dictated above. Patient received 2 g IV magnesium here, as well as a liter of normal saline. I am doubtful that all of her symptoms are secondary to low magnesium. Patient appears depressed somewhat, but this point is stable for outpatient follow-up. Final disposition is checked out to Dr. Kerman Passey.    Earleen Newport, MD   Earleen Newport, MD 03/17/15 [phone]

## 2015-03-17 ENCOUNTER — Telehealth: Payer: Self-pay

## 2015-03-17 LAB — URINE CULTURE, REFLEX

## 2015-03-17 NOTE — Telephone Encounter (Signed)
Informed Patient the CT scan was unremarkable.  Patient verbalized understanding.

## 2015-03-18 LAB — UA/M W/RFLX CULTURE, ROUTINE

## 2015-03-20 DIAGNOSIS — R0902 Hypoxemia: Secondary | ICD-10-CM | POA: Diagnosis not present

## 2015-03-20 DIAGNOSIS — J449 Chronic obstructive pulmonary disease, unspecified: Secondary | ICD-10-CM | POA: Diagnosis not present

## 2015-03-20 DIAGNOSIS — G4733 Obstructive sleep apnea (adult) (pediatric): Secondary | ICD-10-CM | POA: Diagnosis not present

## 2015-03-23 ENCOUNTER — Encounter: Payer: Self-pay | Admitting: Emergency Medicine

## 2015-03-23 ENCOUNTER — Telehealth: Payer: Self-pay | Admitting: Family Medicine

## 2015-03-23 ENCOUNTER — Other Ambulatory Visit: Payer: Self-pay

## 2015-03-23 ENCOUNTER — Emergency Department
Admission: EM | Admit: 2015-03-23 | Discharge: 2015-03-23 | Disposition: A | Discharge status: Home / Self Care | Payer: Medicare Other | Attending: Emergency Medicine | Admitting: Emergency Medicine

## 2015-03-23 DIAGNOSIS — R5383 Other fatigue: Secondary | ICD-10-CM

## 2015-03-23 DIAGNOSIS — I129 Hypertensive chronic kidney disease with stage 1 through stage 4 chronic kidney disease, or unspecified chronic kidney disease: Secondary | ICD-10-CM | POA: Insufficient documentation

## 2015-03-23 DIAGNOSIS — E119 Type 2 diabetes mellitus without complications: Secondary | ICD-10-CM | POA: Insufficient documentation

## 2015-03-23 DIAGNOSIS — R531 Weakness: Secondary | ICD-10-CM | POA: Diagnosis present

## 2015-03-23 DIAGNOSIS — N390 Urinary tract infection, site not specified: Secondary | ICD-10-CM | POA: Diagnosis not present

## 2015-03-23 DIAGNOSIS — N183 Chronic kidney disease, stage 3 (moderate): Secondary | ICD-10-CM | POA: Diagnosis not present

## 2015-03-23 LAB — BASIC METABOLIC PANEL
Anion gap: 14 (ref 5–15)
BUN: 47 mg/dL — AB (ref 6–20)
CHLORIDE: 99 mmol/L — AB (ref 101–111)
CO2: 21 mmol/L — ABNORMAL LOW (ref 22–32)
Calcium: 9.6 mg/dL (ref 8.9–10.3)
Creatinine, Ser: 1.78 mg/dL — ABNORMAL HIGH (ref 0.44–1.00)
GFR calc non Af Amer: 32 mL/min — ABNORMAL LOW (ref >60)
GFR, EST AFRICAN AMERICAN: 37 mL/min — AB (ref >60)
Glucose, Bld: 354 mg/dL — ABNORMAL HIGH (ref 65–99)
Potassium: 4.7 mmol/L (ref 3.5–5.1)
Sodium: 134 mmol/L — ABNORMAL LOW (ref 135–145)

## 2015-03-23 LAB — CBC WITH DIFFERENTIAL/PLATELET
BASOS PCT: 0 % (ref 0–1)
Band Neutrophils: 1 % (ref 0–10)
Basophils Absolute: 0 10*3/uL (ref 0.0–0.1)
Blasts: 0 %
EOS PCT: 1 % (ref 0–5)
Eosinophils Absolute: 0.1 10*3/uL (ref 0.0–0.7)
HEMATOCRIT: 36.8 % (ref 35.0–47.0)
Hemoglobin: 11.8 g/dL — ABNORMAL LOW (ref 12.0–16.0)
LYMPHS PCT: 54 % — AB (ref 12–46)
Lymphs Abs: 3.8 10*3/uL (ref 0.7–4.0)
MCH: 27 pg (ref 26.0–34.0)
MCHC: 32.2 g/dL (ref 32.0–36.0)
MCV: 83.7 fL (ref 80.0–100.0)
MONOS PCT: 1 % — AB (ref 3–12)
MYELOCYTES: 0 %
Metamyelocytes Relative: 1 %
Monocytes Absolute: 0.1 10*3/uL (ref 0.1–1.0)
NEUTROS PCT: 42 % — AB (ref 43–77)
Neutro Abs: 3.2 10*3/uL (ref 1.7–7.7)
Other: 0 %
PLATELETS: 321 10*3/uL (ref 150–440)
Promyelocytes Absolute: 0 %
RBC: 4.39 MIL/uL (ref 3.80–5.20)
RDW: 16.6 % — AB (ref 11.5–14.5)
WBC: 7.2 10*3/uL (ref 3.6–11.0)
nRBC: 0 /100 WBC

## 2015-03-23 LAB — URINALYSIS COMPLETE WITH MICROSCOPIC (ARMC ONLY)
Bilirubin Urine: NEGATIVE
Glucose, UA: 50 mg/dL — AB
Hgb urine dipstick: NEGATIVE
KETONES UR: NEGATIVE mg/dL
NITRITE: NEGATIVE
Protein, ur: 30 mg/dL — AB
Specific Gravity, Urine: 1.024 (ref 1.005–1.030)
pH: 5 (ref 5.0–8.0)

## 2015-03-23 LAB — MAGNESIUM: Magnesium: 1.9 mg/dL (ref 1.7–2.4)

## 2015-03-23 LAB — TROPONIN I

## 2015-03-23 LAB — GLUCOSE, CAPILLARY
Glucose-Capillary: 316 mg/dL — ABNORMAL HIGH (ref 65–99)
Glucose-Capillary: 265 mg/dL — ABNORMAL HIGH (ref 65–99)

## 2015-03-23 MED ORDER — NITROFURANTOIN MONOHYD MACRO 100 MG PO CAPS: 100.0000 mg | ORAL_CAPSULE | Freq: Two times a day (BID) | ORAL | Status: AC

## 2015-03-23 MED ORDER — SODIUM CHLORIDE 0.9 % IV BOLUS (SEPSIS)
1000.0000 mL | Freq: Once | INTRAVENOUS | Status: AC
  Administered 2015-03-23: 1000 mL via INTRAVENOUS

## 2015-03-23 NOTE — Telephone Encounter (Signed)
I reviewed ER record Do NOT take any metformin Come in to see me tomorrow morning Call first thing to get appt time Hydrate, hydrate Back to ER if worse

## 2015-03-23 NOTE — ED Notes (Signed)
Patient attempted to urinate, but was urine missed hat in toilet.

## 2015-03-23 NOTE — ED Notes (Addendum)
Patient to ER for c/o weakness x3 days. States her mother had same day procedure a few days ago and patient had near syncopal episode while visiting her. Patient reports continued slurred speech x3 days, but has no noted slurred speech. When asked if slurred speech ever stopped and started again, patient states it has been continuous. No unilateral weakness or facial droop present. Husband describes speech as "mumbling, like she's too tired to get the words out".

## 2015-03-23 NOTE — ED Provider Notes (Signed)
[facility] Emergency Department Provider Note    ____________________________________________  Time seen: 6606  I have reviewed the triage vital signs and the nursing notes.   HISTORY  Chief Complaint Weakness   History limited by: Not Limited   HPI [name] is a 49 y.o. female who presents to the emergency department today because of concerns for fatigue. Patient states she has felt tired for the past 3 days. It has progressively gotten worse. She states she finds it very difficult to get up and out of bed. The patient denies any associated chest pain or shortness of breath. She denies any fevers. She was seen in the emergency department roughly 1 week ago for hypomagnesemia. The patient is concerned that her magnesium may still be low.     Past Medical History  Diagnosis Date  . High blood pressure   . Heart attack   . Irregular heart rhythm   . Seasonal allergies   . Chronic headaches   . Sleep apnea   . Hyperlipidemia   . COPD (chronic obstructive pulmonary disease)   . Asthma   . Hypothyroid   . Morbid obesity   . OSA (obstructive sleep apnea)   . RLS (restless legs syndrome)   . GERD (gastroesophageal reflux disease)   . Bipolar depression   . Vitamin D deficiency   . Urinary incontinence   . Hypoxemia   . Liver lesion     Favored to be benign on CT; MRI of abd w/contract in Aug 2016. Liver Biopsy-Negative, March 2016  . Acute renal failure   . Hypomagnesemia   . Hypertension   . Headache   . Diabetes mellitus without complication     Type 2, insulin dependent. Patient also takes Metformin.  . CHF (congestive heart failure)     Patient Active Problem List   Diagnosis Date Noted  . Chronic kidney disease, stage III (moderate) 03/15/2015  . Anemia 03/15/2015  . Cellulitis of face 03/15/2015  . Migraine 03/12/2015  . Backache 03/12/2015  . COPD (chronic obstructive pulmonary disease) 01/20/2015  . Selective deficiency  of IgG 01/20/2015  . GERD (gastroesophageal reflux disease) 01/20/2015  . Disorder of smooth muscle 01/20/2015  . Hepatic encephalopathy 01/20/2015  . Hypothyroid   . Morbid obesity   . OSA (obstructive sleep apnea)   . RLS (restless legs syndrome)   . Bipolar depression   . Vitamin D deficiency   . Urinary incontinence   . Liver lesion   . Acute renal failure   . Hypomagnesemia   . Dyspnea 10/20/2013  . Obstructive sleep apnea 10/20/2013  . PERIPHERAL NEUROPATHY 03/04/2009  . RENAL INSUFFICIENCY 03/04/2009  . HEMORRHOIDS, EXTERNAL 09/03/2008  . MORBID OBESITY 10/14/2007  . ALLERGIC RHINITIS 07/04/2007  . ASTHMA, PERSISTENT, MODERATE 07/04/2007  . INTERSTITIAL CYSTITIS 07/04/2007  . HYPERTRIGLYCERIDEMIA 07/03/2007  . FATIGUE, CHRONIC 07/03/2007  . BIPOLAR AFFECTIVE DISORDER, HX OF 07/03/2007  . DM 06/19/2007  . HYPERTENSION, BENIGN ESSENTIAL 06/19/2007    Past Surgical History  Procedure Laterality Date  . Abdominal hysterectomy    . Tubal ligation    . Liver biopsy  March 2016    Negative  . Abdominal hysterectomy      Partial  . Cholecystectomy    . Bladder surgery      x 2  . Lung biopsy      Current Outpatient Rx  Name  Route  Sig  Dispense  Refill  . ACCU-CHEK AVIVA PLUS test strip  Dispense as written.   Marland Kitchen ACCU-CHEK SOFTCLIX LANCETS lancets               . ALPRAZolam (XANAX) 0.25 MG tablet   Oral   Take 0.25 mg by mouth 2 (two) times daily.         . Choline Fenofibrate (FENOFIBRIC ACID) 135 MG CPDR   Oral   Take 1 capsule by mouth daily.         . Choline Fenofibrate (TRILIPIX) 135 MG capsule   Oral   Take by mouth.         . divalproex (DEPAKOTE) 500 MG DR tablet   Oral   Take 1,000 mg by mouth 2 (two) times daily.         Marland Kitchen doxycycline (VIBRA-TABS) 100 MG tablet   Oral   Take 1 tablet (100 mg total) by mouth 2 (two) times daily.   20 tablet   0   . DULoxetine (CYMBALTA) 30 MG capsule   Oral   Take by  mouth.         . DULoxetine (CYMBALTA) 60 MG capsule   Oral   Take 120 mg by mouth daily.         . DULoxetine (CYMBALTA) 60 MG capsule   Oral   Take 60 mg by mouth 2 (two) times daily.          Marland Kitchen gabapentin (NEURONTIN) 300 MG capsule   Oral   Take 300 mg by mouth 3 (three) times daily.         . insulin glargine (LANTUS) 100 UNIT/ML injection   Subcutaneous   Inject 75 Units into the skin 2 (two) times daily.         . insulin lispro (HUMALOG) 100 UNIT/ML injection   Subcutaneous   Inject into the skin 3 (three) times daily as needed for high blood sugar.         . insulin lispro (HUMALOG) 100 UNIT/ML injection   Subcutaneous   Inject into the skin. Every 6 hours before meals, sliding scale         . insulin NPH Human (HUMULIN N,NOVOLIN N) 100 UNIT/ML injection   Subcutaneous   Inject into the skin. 75 units in the am, 50 units in at lunch, 75 units at night         . INSULIN SYRINGE .5CC/28G 28G X 1/2" 0.5 ML MISC               . lamoTRIgine (LAMICTAL) 100 MG tablet   Oral   Take 100 mg by mouth at bedtime.         . lamoTRIgine (LAMICTAL) 100 MG tablet   Oral   Take 125 mg by mouth daily.         Marland Kitchen lamoTRIgine (LAMICTAL) 25 MG tablet   Oral   Take 25 mg by mouth at bedtime.         . lamoTRIgine (LAMICTAL) 25 MG tablet   Oral   Take by mouth.         . levothyroxine (SYNTHROID, LEVOTHROID) 75 MCG tablet   Oral   Take 75 mcg by mouth daily before breakfast.         . levothyroxine (SYNTHROID, LEVOTHROID) 75 MCG tablet   Oral   Take 75 mcg by mouth daily before breakfast.         . Liraglutide (VICTOZA) 18 MG/3ML SOPN   Subcutaneous   Inject 1.8 Units into the skin  daily.         . magnesium oxide (MAG-OX) 400 MG tablet   Oral   Take 400 mg by mouth 2 (two) times daily.         . magnesium oxide (MAG-OX) 400 MG tablet   Oral   Take 400 mg by mouth 3 (three) times daily.         . metFORMIN (GLUCOPHAGE) 1000 MG  tablet   Oral   Take 1,000 mg by mouth 2 (two) times daily with a meal.         . metFORMIN (GLUCOPHAGE-XR) 500 MG 24 hr tablet   Oral   Take 500 mg by mouth. Take 2 tabs twice daily         . metoprolol tartrate (LOPRESSOR) 25 MG tablet   Oral   Take 12.5 mg by mouth 2 (two) times daily.         . Multiple Vitamin (MULTIVITAMIN) capsule   Oral   Take by mouth.         Marland Kitchen olopatadine (PATANOL) 0.1 % ophthalmic solution   Both Eyes   Place 1 drop into both eyes 2 (two) times daily as needed.          . Omega-3-Acid Eth Est, Dietary, 1 G CAPS   Oral   Take by mouth.         . pantoprazole (PROTONIX) 40 MG tablet   Oral   Take 40 mg by mouth daily.         . QUEtiapine (SEROQUEL) 25 MG tablet   Oral   Take 25 mg by mouth 2 (two) times daily. Take one in am, one at lunch         . QUEtiapine (SEROQUEL) 300 MG tablet   Oral   Take 900 mg by mouth at bedtime.         Marland Kitchen QUEtiapine (SEROQUEL) 400 MG tablet   Oral   Take 400 mg by mouth 2 (two) times daily. Take 2 tabs at bedtime         . quinapril (ACCUPRIL) 5 MG tablet   Oral   Take 5 mg by mouth daily.         . ranitidine (ZANTAC) 150 MG capsule   Oral   Take by mouth.         . rosuvastatin (CRESTOR) 10 MG tablet   Oral   Take 10 mg by mouth at bedtime.         . simvastatin (ZOCOR) 20 MG tablet      TAKE 1 TABLET BY MOUTH EVERY DAY.         Marland Kitchen Spacer/Aero-Holding Chambers (AEROCHAMBER MV) inhaler      Use as instructed   1 each   0   . sucralfate (CARAFATE) 1 G tablet   Oral   Take 1 g by mouth 4 (four) times daily.         . Syringe, Disposable, (20-25CC SYRINGE) 25 ML MISC      Use 1 Syringe daily.         Marland Kitchen topiramate (TOPAMAX) 25 MG tablet   Oral   Take 1 tablet (25 mg total) by mouth 2 (two) times daily.   120 tablet   3   . traMADol (ULTRAM) 50 MG tablet   Oral   Take 2 tablets (100 mg total) by mouth every 6 (six) hours as needed.   30 tablet   1   .  traZODone (DESYREL) 100 MG tablet   Oral   Take 100 mg by mouth at bedtime.           Allergies Morphine and related; Codeine; Codeine; Hydrocodone-acetaminophen; Mirtazapine; Morphine and related; Sulfamethoxazole-trimethoprim; and Vicodin  Family History  Problem Relation Age of Onset  . Heart disease Maternal Grandfather   . Rheum arthritis Maternal Grandfather   . Cancer Maternal Grandfather     liver  . Cancer Paternal Grandmother     lung  . Diabetes Mother   . Heart disease Mother   . Hyperlipidemia Mother   . Hypertension Mother   . Kidney disease Mother   . Mental illness Mother   . Hypothyroidism Mother   . Stroke Mother   . Osteoporosis Mother   . Glaucoma Mother   . Hypertension Father   . Asthma Daughter   . Cancer Daughter     throat    Social History History  Substance Use Topics  . Smoking status: Never Smoker   . Smokeless tobacco: Never Used  . Alcohol Use: No    Review of Systems  Constitutional: Negative for fever. Cardiovascular: Negative for chest pain. Respiratory: Negative for shortness of breath. Gastrointestinal: Negative for abdominal pain, vomiting and diarrhea. Genitourinary: Negative for dysuria. Musculoskeletal: Negative for back pain. Skin: Negative for rash. Neurological: Negative for headaches, focal weakness or numbness.   10-point ROS otherwise negative.  ____________________________________________   PHYSICAL EXAM:  VITAL SIGNS: ED Triage Vitals  Enc Vitals Group     BP 03/23/15 1316 97/46 mmHg     Pulse Rate 03/23/15 1316 96     Resp 03/23/15 1316 18     Temp 03/23/15 1316 97.5 F (36.4 C)     Temp Source 03/23/15 1316 Oral     SpO2 03/23/15 1316 95 %     Weight 03/23/15 1316 187 lb (84.823 kg)     Height 03/23/15 1316 4' 11"  (1.499 m)   Constitutional: Alert and oriented. Well appearing and in no distress. Eyes: Conjunctivae are normal. PERRL. Normal extraocular movements. ENT   Head:  Normocephalic and atraumatic.   Nose: No congestion/rhinnorhea.   Mouth/Throat: Mucous membranes are moist.   Neck: No stridor. Hematological/Lymphatic/Immunilogical: No cervical lymphadenopathy. Cardiovascular: Normal rate, regular rhythm.  No murmurs, rubs, or gallops. Respiratory: Normal respiratory effort without tachypnea nor retractions. Breath sounds are clear and equal bilaterally. No wheezes/rales/rhonchi. Gastrointestinal: Soft and nontender. No distention. There is no CVA tenderness. Genitourinary: Deferred Musculoskeletal: Normal range of motion in all extremities. No joint effusions.  No lower extremity tenderness nor edema. Neurologic:  Normal speech and language. PERRL, extraocular motions intact. Tongue midline. Strength 5 out of 5 in upper and lower extremities. Sensation grossly intact. No gross focal neurologic deficits are appreciated. Speech is normal.  Skin:  Skin is warm, dry and intact. No rash noted. Psychiatric: Mood and affect are normal. Speech and behavior are normal. Patient exhibits appropriate insight and judgment.  ____________________________________________    LABS (pertinent positives/negatives)  Labs Reviewed  CBC WITH DIFFERENTIAL/PLATELET - Abnormal; Notable for the following:    Hemoglobin 11.8 (*)    RDW 16.6 (*)    Neutrophils Relative % 42 (*)    Lymphocytes Relative 54 (*)    Monocytes Relative 1 (*)    All other components within normal limits  BASIC METABOLIC PANEL - Abnormal; Notable for the following:    Sodium 134 (*)    Chloride 99 (*)    CO2 21 (*)  Glucose, Bld 354 (*)    BUN 47 (*)    Creatinine, Ser 1.78 (*)    GFR calc non Af Amer 32 (*)    GFR calc Af Amer 37 (*)    All other components within normal limits  GLUCOSE, CAPILLARY - Abnormal; Notable for the following:    Glucose-Capillary 316 (*)    All other components within normal limits  URINALYSIS COMPLETEWITH MICROSCOPIC ([facility] ONLY) - Abnormal; Notable for  the following:    Color, Urine AMBER (*)    APPearance CLOUDY (*)    Glucose, UA 50 (*)    Protein, ur 30 (*)    Leukocytes, UA 2+ (*)    Bacteria, UA MANY (*)    Squamous Epithelial / LPF 6-30 (*)    All other components within normal limits  GLUCOSE, CAPILLARY - Abnormal; Notable for the following:    Glucose-Capillary 265 (*)    All other components within normal limits  TROPONIN I  MAGNESIUM  POCT CBG (FASTING - GLUCOSE)-MANUAL ENTRY     ____________________________________________   EKG  I, Nance Pear, attending physician, personally viewed and interpreted this EKG  EKG Time: 1322 Rate: 92 Rhythm: Normal sinus rhythm Axis: Left axis deviation Intervals: qtc 469 QRS: Narrow ST changes: No ST elevation    ____________________________________________    RADIOLOGY  None  ____________________________________________   PROCEDURES  Procedure(s) performed: None  Critical Care performed: No  ____________________________________________   INITIAL IMPRESSION / ASSESSMENT AND PLAN / ED COURSE  Pertinent labs & imaging results that were available during my care of the patient were reviewed by me and considered in my medical decision making (see chart for details).  Patient presents to the emergency department today because of concerns for fatigue for the past 3 days. No concerning findings on physical exam. Urine is concerning for possible urinary tract infection. Magnesium today was within normal limits. Will give prescription for antibiotics and encourage primary care follow-up.  ____________________________________________   FINAL CLINICAL IMPRESSION(S) / ED DIAGNOSES  Final diagnoses:  Other fatigue  UTI (lower urinary tract infection)     Nance Pear, MD 03/23/15 0076

## 2015-03-23 NOTE — Discharge Instructions (Signed)
Please seek medical attention for any high fevers, chest pain, shortness of breath, change in behavior, persistent vomiting, bloody stool or any other new or concerning symptoms.  Urinary Tract Infection Urinary tract infections (UTIs) can develop anywhere along your urinary tract. Your urinary tract is your body's drainage system for removing wastes and extra water. Your urinary tract includes two kidneys, two ureters, a bladder, and a urethra. Your kidneys are a pair of bean-shaped organs. Each kidney is about the size of your fist. They are located below your ribs, one on each side of your spine. CAUSES Infections are caused by microbes, which are microscopic organisms, including fungi, viruses, and bacteria. These organisms are so small that they can only be seen through a microscope. Bacteria are the microbes that most commonly cause UTIs. SYMPTOMS  Symptoms of UTIs may vary by age and gender of the patient and by the location of the infection. Symptoms in young women typically include a frequent and intense urge to urinate and a painful, burning feeling in the bladder or urethra during urination. Older women and men are more likely to be tired, shaky, and weak and have muscle aches and abdominal pain. A fever may mean the infection is in your kidneys. Other symptoms of a kidney infection include pain in your back or sides below the ribs, nausea, and vomiting. DIAGNOSIS To diagnose a UTI, your caregiver will ask you about your symptoms. Your caregiver also will ask to provide a urine sample. The urine sample will be tested for bacteria and white blood cells. White blood cells are made by your body to help fight infection. TREATMENT  Typically, UTIs can be treated with medication. Because most UTIs are caused by a bacterial infection, they usually can be treated with the use of antibiotics. The choice of antibiotic and length of treatment depend on your symptoms and the type of bacteria causing your  infection. HOME CARE INSTRUCTIONS  If you were prescribed antibiotics, take them exactly as your caregiver instructs you. Finish the medication even if you feel better after you have only taken some of the medication.  Drink enough water and fluids to keep your urine clear or pale yellow.  Avoid caffeine, tea, and carbonated beverages. They tend to irritate your bladder.  Empty your bladder often. Avoid holding urine for long periods of time.  Empty your bladder before and after sexual intercourse.  After a bowel movement, women should cleanse from front to back. Use each tissue only once. SEEK MEDICAL CARE IF:   You have back pain.  You develop a fever.  Your symptoms do not begin to resolve within 3 days. SEEK IMMEDIATE MEDICAL CARE IF:   You have severe back pain or lower abdominal pain.  You develop chills.  You have nausea or vomiting.  You have continued burning or discomfort with urination. MAKE SURE YOU:   Understand these instructions.  Will watch your condition.  Will get help right away if you are not doing well or get worse. Document Released: 05/31/2005 Document Revised: 02/20/2012 Document Reviewed: 09/29/2011 [facility] Patient Information 2015 [place], [place]. This information is not intended to replace advice given to you by your health care provider. Make sure you discuss any questions you have with your health care provider.

## 2015-03-24 ENCOUNTER — Ambulatory Visit (INDEPENDENT_AMBULATORY_CARE_PROVIDER_SITE_OTHER): Payer: Medicare Other | Admitting: Family Medicine

## 2015-03-24 ENCOUNTER — Encounter: Payer: Self-pay | Admitting: Family Medicine

## 2015-03-24 VITALS — BP 126/78 | HR 95 | Temp 98.7°F | Wt 186.0 lb

## 2015-03-24 DIAGNOSIS — N390 Urinary tract infection, site not specified: Secondary | ICD-10-CM | POA: Insufficient documentation

## 2015-03-24 DIAGNOSIS — N3000 Acute cystitis without hematuria: Secondary | ICD-10-CM | POA: Diagnosis not present

## 2015-03-24 DIAGNOSIS — N183 Chronic kidney disease, stage 3 unspecified: Secondary | ICD-10-CM

## 2015-03-24 DIAGNOSIS — N179 Acute kidney failure, unspecified: Secondary | ICD-10-CM | POA: Diagnosis not present

## 2015-03-24 NOTE — Assessment & Plan Note (Signed)
Mother and brother both with kidney disease; patient has such fragile kidney function; will refer to nephrologist for evaluation to see if something inherited that might be causing her to go into acute renal failure from time to time

## 2015-03-24 NOTE — Patient Instructions (Addendum)
We'll have you see a kidney doctor Do NOT take metformin Call the ER and ask if the culture is back on your urine and see if they want to switch your antibiotic (Macrobid not first choice if bad kidneys) Hydrate, hydrate, hydrate Check the medicine list and let us know of any other corrections Please do call Dr. Richardson Landry and schedule an appointment for him to see you about the painful swallowing and knot on your neck

## 2015-03-24 NOTE — Assessment & Plan Note (Addendum)
Hydration; labs reviewed; STOP metformin for now, do not start back until expressly told it's okay; I explained that macrobid would not be my choice for an antibiotic; if feeling worse, go to the ER; see under UTI; patient not taking macrobid and will start another antibiotic tomorrow dosed for CrCl

## 2015-03-24 NOTE — Assessment & Plan Note (Signed)
She was put on Macrobid by ER doctor yesterday; she has not filled it yet; I asked her to please call back to the ER to see if they will put her on something else; I do not kknow if a culture was done; patient will call and I'll call to follow-up after clinic ..................... I called husband; patient tried but was not successful in getting help from ER apparently; she is NOT taking the Macrobid after I spoke with her; creatinine pending and I should have that soon; husband says they cannot go to pick up new antibiotic tonight anyway, so will start that tomorrow; her pressure was better today and her WBC was normal, so I don't think UTI causing sepsis; I think the renal failure was the greater concern

## 2015-03-24 NOTE — Progress Notes (Addendum)
BP 126/78 mmHg  Pulse 95  Temp(Src) 98.7 F (37.1 C)  Wt 186 lb (84.369 kg)  SpO2 93%   Subjective:    Patient ID: [name], female    DOB: July 25, 1966, 49 y.o.   MRN: [id number]  HPI: [name] is a 49 y.o. female  Chief Complaint  Patient presents with  . ER Follow Up    UTI   She has had no energy at all She was in the ER yesterday; her creatinine was 1.78 (up from 1.08 one week ago) She has been really weak the last few days They checked her urine and it was a mess; stopped doxy and started macrobid and told her to drink, drink, drink; having burning with urination I called her last night and said stop the metformin; sugars are in the 300s; her endocrinologist is Dr. Thedore Mins Dizzy and could hardly stand up; slurred speech; no head imaging recently, but did have scan a few weeks ago Her magnesium was okay yesterday She saw the neurologist and they did the CT scan a few weeks ago She has seen Dr. Richardson Landry in the past; she is having pain and swelling on the left side of the neck; been there for about two weeks; pain with swallowing, burns with swallowing  Relevant past medical, surgical, family and social history reviewed and updated as indicated. Interim medical history since our last visit reviewed. Mother has kidney failure and is on dialysis; maternal grandfather was on dialysis; brother has kidney disease and might need kidney tranplant  Allergies and medications reviewed and updated.  Review of Systems Per HPI unless specifically indicated above     Objective:    BP 126/78 mmHg  Pulse 95  Temp(Src) 98.7 F (37.1 C)  Wt 186 lb (84.369 kg)  SpO2 93%  Wt Readings from Last 3 Encounters:  03/24/15 186 lb (84.369 kg)  03/23/15 187 lb (84.823 kg)  03/16/15 191 lb (86.637 kg)    Physical Exam  Constitutional: She appears well-developed and well-nourished. No distress.  HENT:  Head: Normocephalic and atraumatic.  Eyes: EOM are normal. No scleral icterus.   Neck: No thyromegaly present.  Cardiovascular: Normal rate, regular rhythm and normal heart sounds.   No murmur heard. Pulmonary/Chest: Effort normal and breath sounds normal. No respiratory distress. She has no wheezes.  Abdominal: Soft. Bowel sounds are normal. She exhibits no distension.  Musculoskeletal: Normal range of motion. She exhibits no edema.  Neurological: She is alert. She exhibits normal muscle tone.  Skin: Skin is warm and dry. She is not diaphoretic. No pallor.  Psychiatric: She has a normal mood and affect. Her behavior is normal. Judgment and thought content normal.    Results for orders placed or performed during the hospital encounter of 03/23/15  CBC WITH DIFFERENTIAL  Result Value Ref Range   WBC 7.2 3.6 - 11.0 K/uL   RBC 4.39 3.80 - 5.20 MIL/uL   Hemoglobin 11.8 (L) 12.0 - 16.0 g/dL   HCT 36.8 35.0 - 47.0 %   MCV 83.7 80.0 - 100.0 fL   MCH 27.0 26.0 - 34.0 pg   MCHC 32.2 32.0 - 36.0 g/dL   RDW 16.6 (H) 11.5 - 14.5 %   Platelets 321 150 - 440 K/uL   Neutrophils Relative % 42 (L) 43 - 77 %   Lymphocytes Relative 54 (H) 12 - 46 %   Monocytes Relative 1 (L) 3 - 12 %   Eosinophils Relative 1 0 - 5 %  Basophils Relative 0 0 - 1 %   Band Neutrophils 1 0 - 10 %   Metamyelocytes Relative 1 %   Myelocytes 0 %   Promyelocytes Absolute 0 %   Blasts 0 %   nRBC 0 0 /100 WBC   Other 0 %   Smear Review MORPHOLOGY UNREMARKABLE    Neutro Abs 3.2 1.7 - 7.7 K/uL   Lymphs Abs 3.8 0.7 - 4.0 K/uL   Monocytes Absolute 0.1 0.1 - 1.0 K/uL   Eosinophils Absolute 0.1 0.0 - 0.7 K/uL   Basophils Absolute 0.0 0.0 - 0.1 K/uL  Basic metabolic panel  Result Value Ref Range   Sodium 134 (L) 135 - 145 mmol/L   Potassium 4.7 3.5 - 5.1 mmol/L   Chloride 99 (L) 101 - 111 mmol/L   CO2 21 (L) 22 - 32 mmol/L   Glucose, Bld 354 (H) 65 - 99 mg/dL   BUN 47 (H) 6 - 20 mg/dL   Creatinine, Ser 1.78 (H) 0.44 - 1.00 mg/dL   Calcium 9.6 8.9 - 10.3 mg/dL   GFR calc non Af Amer 32 (L) >60  mL/min   GFR calc Af Amer 37 (L) >60 mL/min   Anion gap 14 5 - 15  Troponin I  Result Value Ref Range   Troponin I <0.03 <0.031 ng/mL  Glucose, capillary  Result Value Ref Range   Glucose-Capillary 316 (H) 65 - 99 mg/dL  Magnesium  Result Value Ref Range   Magnesium 1.9 1.7 - 2.4 mg/dL  Urinalysis complete, with microscopic ([facility] only)  Result Value Ref Range   Color, Urine AMBER (A) YELLOW   APPearance CLOUDY (A) CLEAR   Glucose, UA 50 (A) NEGATIVE mg/dL   Bilirubin Urine NEGATIVE NEGATIVE   Ketones, ur NEGATIVE NEGATIVE mg/dL   Specific Gravity, Urine 1.024 1.005 - 1.030   Hgb urine dipstick NEGATIVE NEGATIVE   pH 5.0 5.0 - 8.0   Protein, ur 30 (A) NEGATIVE mg/dL   Nitrite NEGATIVE NEGATIVE   Leukocytes, UA 2+ (A) NEGATIVE   RBC / HPF 0-5 0 - 5 RBC/hpf   WBC, UA 6-30 0 - 5 WBC/hpf   Bacteria, UA MANY (A) NONE SEEN   Squamous Epithelial / LPF 6-30 (A) NONE SEEN   Mucous PRESENT    Budding Yeast PRESENT    Hyaline Casts, UA PRESENT   Glucose, capillary  Result Value Ref Range   Glucose-Capillary 265 (H) 65 - 99 mg/dL      Assessment & Plan:   Problem List Items Addressed This Visit      Genitourinary   Acute renal failure - Primary    Hydration; labs reviewed; STOP metformin for now, do not start back until expressly told it's okay; I explained that macrobid would not be my choice for an antibiotic; if feeling worse, go to the ER; see under UTI; patient not taking macrobid and will start another antibiotic tomorrow dosed for CrCl      Relevant Orders   Basic metabolic panel   Ambulatory referral to Nephrology   Chronic kidney disease, stage III (moderate)    Mother and brother both with kidney disease; patient has such fragile kidney function; will refer to nephrologist for evaluation to see if something inherited that might be causing her to go into acute renal failure from time to time      Relevant Orders   Ambulatory referral to Nephrology   Urinary tract  infection    She was put on Macrobid by  ER doctor yesterday; she has not filled it yet; I asked her to please call back to the ER to see if they will put her on something else; I do not kknow if a culture was done; patient will call and I'll call to follow-up after clinic ..................... I called husband; patient tried but was not successful in getting help from ER apparently; she is NOT taking the Macrobid after I spoke with her; creatinine pending and I should have that soon; husband says they cannot go to pick up new antibiotic tonight anyway, so will start that tomorrow; her pressure was better today and her WBC was normal, so I don't think UTI causing sepsis; I think the renal failure was the greater concern          Follow up plan: No Follow-up on file.  Medications Discontinued During This Encounter  Medication Reason  . Choline Fenofibrate (FENOFIBRIC ACID) 500 MG CPDR Duplicate  . doxycycline (VIBRA-TABS) 100 MG tablet Change in therapy  . DULoxetine (CYMBALTA) 30 MG capsule Duplicate  . DULoxetine (CYMBALTA) 60 MG capsule Duplicate  . levothyroxine (SYNTHROID, LEVOTHROID) 75 MCG tablet Duplicate  . lamoTRIgine (LAMICTAL) 25 MG tablet Duplicate  . lamoTRIgine (LAMICTAL) 938 MG tablet Duplicate  . magnesium oxide (MAG-OX) 182 MG tablet Duplicate  . metFORMIN (GLUCOPHAGE-XR) 500 MG 24 hr tablet Duplicate  . QUEtiapine (SEROQUEL) 993 MG tablet Duplicate  . insulin glargine (LANTUS) 100 UNIT/ML injection Discontinued by provider  . Liraglutide (VICTOZA) 18 MG/3ML SOPN Discontinued by provider  . insulin lispro (HUMALOG) 100 UNIT/ML injection Discontinued by provider  . divalproex (DEPAKOTE) 500 MG DR tablet Completed Course  . metFORMIN (GLUCOPHAGE) 1000 MG tablet Discontinued by provider  . simvastatin (ZOCOR) 20 MG tablet Completed Course

## 2015-03-25 ENCOUNTER — Other Ambulatory Visit: Payer: Self-pay | Admitting: Family Medicine

## 2015-03-25 ENCOUNTER — Telehealth: Payer: Self-pay | Admitting: Family Medicine

## 2015-03-25 ENCOUNTER — Encounter: Payer: Medicare Other | Admitting: Diagnostic Neuroimaging

## 2015-03-25 ENCOUNTER — Telehealth: Payer: Self-pay

## 2015-03-25 LAB — BASIC METABOLIC PANEL
BUN/Creatinine Ratio: 30 — ABNORMAL HIGH (ref 9–23)
BUN: 44 mg/dL — AB (ref 6–24)
CALCIUM: 9.8 mg/dL (ref 8.7–10.2)
CO2: 22 mmol/L (ref 18–29)
Chloride: 98 mmol/L (ref 97–108)
Creatinine, Ser: 1.47 mg/dL — ABNORMAL HIGH (ref 0.57–1.00)
GFR calc non Af Amer: 41 mL/min/{1.73_m2} — ABNORMAL LOW (ref >59)
GFR, EST AFRICAN AMERICAN: 47 mL/min/{1.73_m2} — AB (ref >59)
GLUCOSE: 334 mg/dL — AB (ref 65–99)
Potassium: 5 mmol/L (ref 3.5–5.2)
Sodium: 138 mmol/L (ref 134–144)

## 2015-03-25 MED ORDER — OFLOXACIN 200 MG PO TABS: 200.0000 mg | ORAL_TABLET | Freq: Every day | ORAL | Status: DC

## 2015-03-25 MED ORDER — CIPROFLOXACIN HCL 250 MG PO TABS: 250.0000 mg | ORAL_TABLET | Freq: Two times a day (BID) | ORAL | Status: DC

## 2015-03-25 NOTE — Telephone Encounter (Signed)
The pharmacy can not get Floxin. It is not in stock and they can not order it. They checked around with other pharmacies and they do not have it either. The cost is also expensive even if it was avaliable. They want to know if you can change it to something else?

## 2015-03-25 NOTE — Telephone Encounter (Signed)
Please let pharm know I am cancelling ofloxacin and using ciprofloxacin; new Rx already sent; thank you

## 2015-03-25 NOTE — Telephone Encounter (Signed)
Pharmacy notified.

## 2015-03-25 NOTE — Telephone Encounter (Signed)
I sent another in it's place; 50% reduction, 250  Mg BID down from max 500 mg BID so should be appropriate for CrCl

## 2015-03-26 ENCOUNTER — Telehealth: Payer: Self-pay

## 2015-03-26 NOTE — Telephone Encounter (Signed)
Received a message from patient, she said that she was returning Amy's call. I looked to see if I could find where she had tried to get in touch with her, I did not see anything. Patient asked about appointment at [facility], explained that the referral coordinator will call her with that appointment.

## 2015-04-01 ENCOUNTER — Encounter: Payer: Self-pay | Admitting: Diagnostic Neuroimaging

## 2015-04-02 ENCOUNTER — Encounter: Payer: Self-pay | Admitting: Diagnostic Neuroimaging

## 2015-04-05 DIAGNOSIS — M2660 Temporomandibular joint disorder, unspecified: Secondary | ICD-10-CM | POA: Diagnosis not present

## 2015-04-05 DIAGNOSIS — R070 Pain in throat: Secondary | ICD-10-CM | POA: Diagnosis not present

## 2015-04-07 ENCOUNTER — Ambulatory Visit: Payer: Medicare Other | Admitting: Gastroenterology

## 2015-04-08 DIAGNOSIS — E1165 Type 2 diabetes mellitus with hyperglycemia: Secondary | ICD-10-CM | POA: Diagnosis not present

## 2015-04-08 DIAGNOSIS — R112 Nausea with vomiting, unspecified: Secondary | ICD-10-CM | POA: Diagnosis not present

## 2015-04-08 DIAGNOSIS — I10 Essential (primary) hypertension: Secondary | ICD-10-CM | POA: Diagnosis not present

## 2015-04-08 DIAGNOSIS — N179 Acute kidney failure, unspecified: Secondary | ICD-10-CM | POA: Diagnosis not present

## 2015-04-08 IMAGING — CR DG CHEST 2V
1 series · 2 of 2 positions shown · non-contrast
Comparison: 07/23/2013

CLINICAL DATA: Cough, chest pain for 1 day

EXAM:
CHEST  2 VIEW

[Series 1: ap · 0.17mm/px · 2 of 2 slices shown]
[im 1/2]
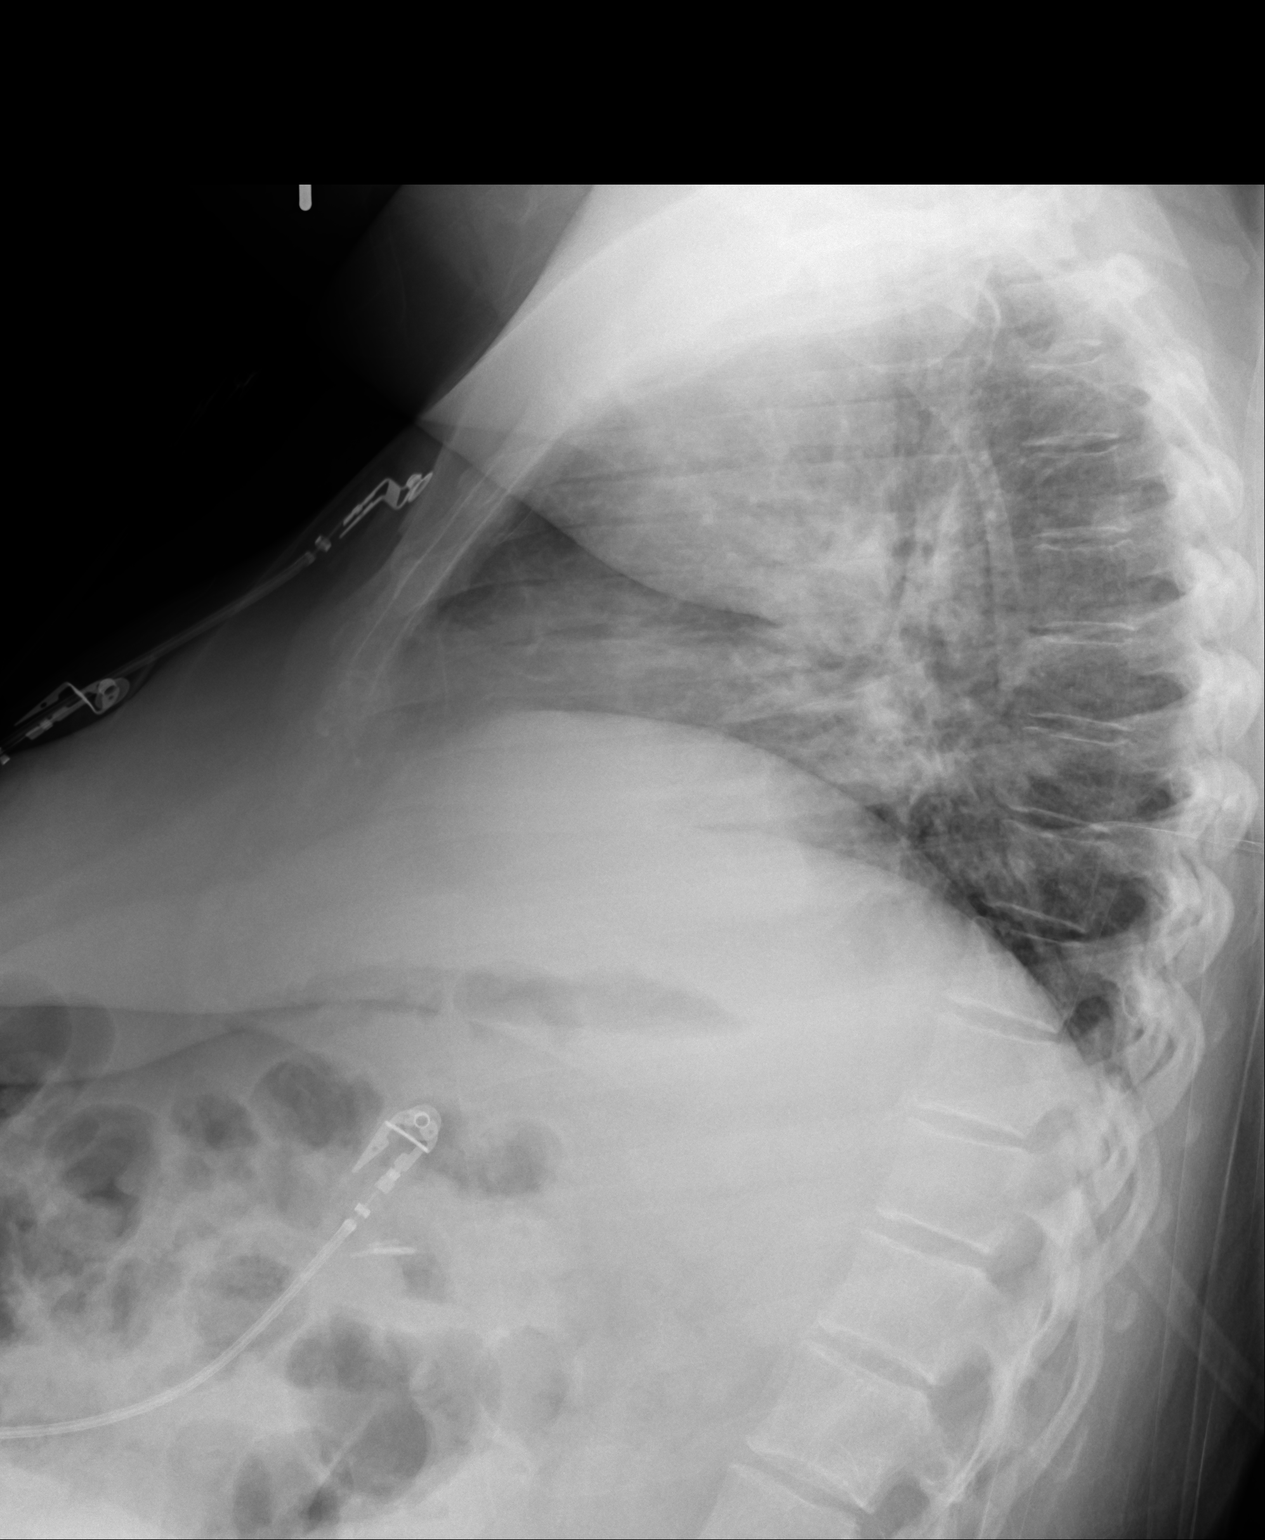
[im 2/2]
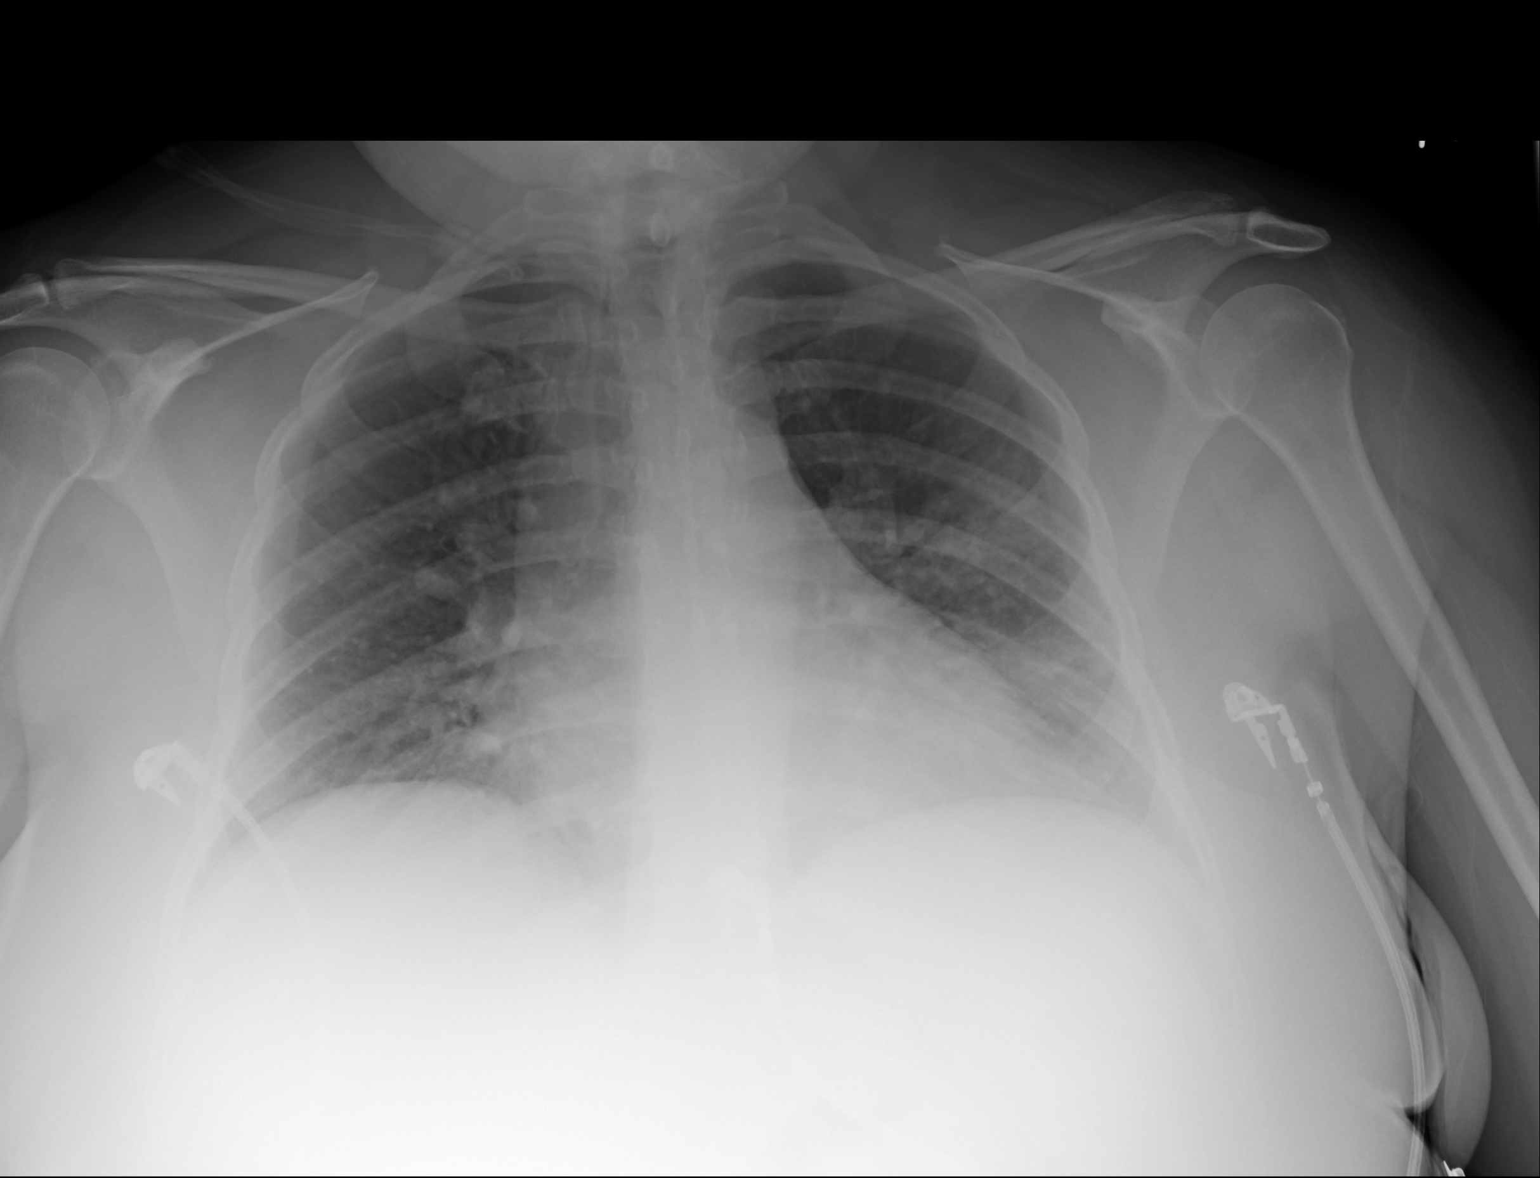

[2 of 2 positions shown; findings below may reference images not displayed]

FINDINGS: There are low lung volumes with crowding of the central pulmonary
markings. There is no focal parenchymal opacity, pleural effusion,
or pneumothorax. The heart and mediastinal contours are
unremarkable.

The osseous structures are unremarkable.
IMPRESSION: No active cardiopulmonary disease.

## 2015-04-12 ENCOUNTER — Other Ambulatory Visit: Payer: Self-pay | Admitting: Family Medicine

## 2015-04-13 ENCOUNTER — Ambulatory Visit: Payer: Medicare Other | Admitting: Family Medicine

## 2015-04-13 DIAGNOSIS — M7552 Bursitis of left shoulder: Secondary | ICD-10-CM | POA: Diagnosis not present

## 2015-04-15 ENCOUNTER — Encounter (INDEPENDENT_AMBULATORY_CARE_PROVIDER_SITE_OTHER): Payer: Self-pay | Admitting: Diagnostic Neuroimaging

## 2015-04-15 ENCOUNTER — Ambulatory Visit (INDEPENDENT_AMBULATORY_CARE_PROVIDER_SITE_OTHER): Payer: Medicare Other | Admitting: Diagnostic Neuroimaging

## 2015-04-15 ENCOUNTER — Other Ambulatory Visit: Payer: Self-pay | Admitting: Family Medicine

## 2015-04-15 DIAGNOSIS — M25569 Pain in unspecified knee: Secondary | ICD-10-CM | POA: Diagnosis not present

## 2015-04-15 DIAGNOSIS — Z0289 Encounter for other administrative examinations: Secondary | ICD-10-CM

## 2015-04-15 DIAGNOSIS — M25562 Pain in left knee: Secondary | ICD-10-CM

## 2015-04-15 NOTE — Telephone Encounter (Signed)
Routing to  Provider.

## 2015-04-15 NOTE — Procedures (Signed)
   [facility]  NCS (NERVE CONDUCTION STUDY) WITH EMG (ELECTROMYOGRAPHY) REPORT   STUDY DATE: 04/15/15 PATIENT NAME: [name] DOB: [birth year] MRN: [id number]  ORDERING CLINICIAN: Antony Contras, MD   TECHNOLOGIST: Laretta Alstrom  ELECTROMYOGRAPHER: Earlean Polka. Annaliah Rivenbark, MD  CLINICAL INFORMATION: 49 year old female with low back pain radiating to the left leg. History of diabetes. Evaluate for neuropathy versus lumbar radiculopathy. Patient has bladder stimulator in place.  FINDINGS: NERVE CONDUCTION STUDY: Bilateral peroneal motor responses have decreased amplitudes and normal conduction velocities and normal F-wave latencies. Bilateral tibial motor responses and F wave latencies are normal. Bilateral H reflex responses could not be obtained. Bilateral peroneal sensory responses are normal.   NEEDLE ELECTROMYOGRAPHY: Needle examination of left lower extremity vastus medialis, mesenteric, gastrocnemius is normal. Left L4-5 paraspinal muscles demonstrate rhythmic electrical artifact from bladder stimulator. No evidence of other abnormal spontaneous activity.   IMPRESSION:  Abnormal study demonstrate: 1. Suspected bilateral L5, S1 radiculopathies based on nerve conduction study testing. However this was not confirmed on needle EMG. 2. Given the clinical context small fiber neuropathy not excluded. No evidence of widespread large fiber neuropathy at this time.    INTERPRETING PHYSICIAN:  Penni Bombard, MD Certified in Neurology, Neurophysiology and Neuroimaging  [facility] [address] [place], [place] [address] [phone]

## 2015-04-15 NOTE — Telephone Encounter (Signed)
Last TSH normal in July; refills approved

## 2015-04-19 ENCOUNTER — Telehealth: Payer: Self-pay

## 2015-04-19 NOTE — Telephone Encounter (Signed)
Rn talk to [name] that   the nerve conduction study slight suggestion of pinched nerve near the tailbone but this was not confirmed on the needle study. No worrisome findings Pt understood the findings.

## 2015-04-20 DIAGNOSIS — J449 Chronic obstructive pulmonary disease, unspecified: Secondary | ICD-10-CM | POA: Diagnosis not present

## 2015-04-20 DIAGNOSIS — G4733 Obstructive sleep apnea (adult) (pediatric): Secondary | ICD-10-CM | POA: Diagnosis not present

## 2015-04-20 DIAGNOSIS — R0902 Hypoxemia: Secondary | ICD-10-CM | POA: Diagnosis not present

## 2015-04-25 IMAGING — CR DG CHEST 2V
1 series · 3 of 3 positions shown · non-contrast
Comparison: 09/04/2013

CLINICAL DATA: High blood sugar, weakness, COPD

EXAM:
CHEST  2 VIEW

[Series 1: w chest pa · 0.14mm/px · 3 of 3 slices shown]
[im 1/3]
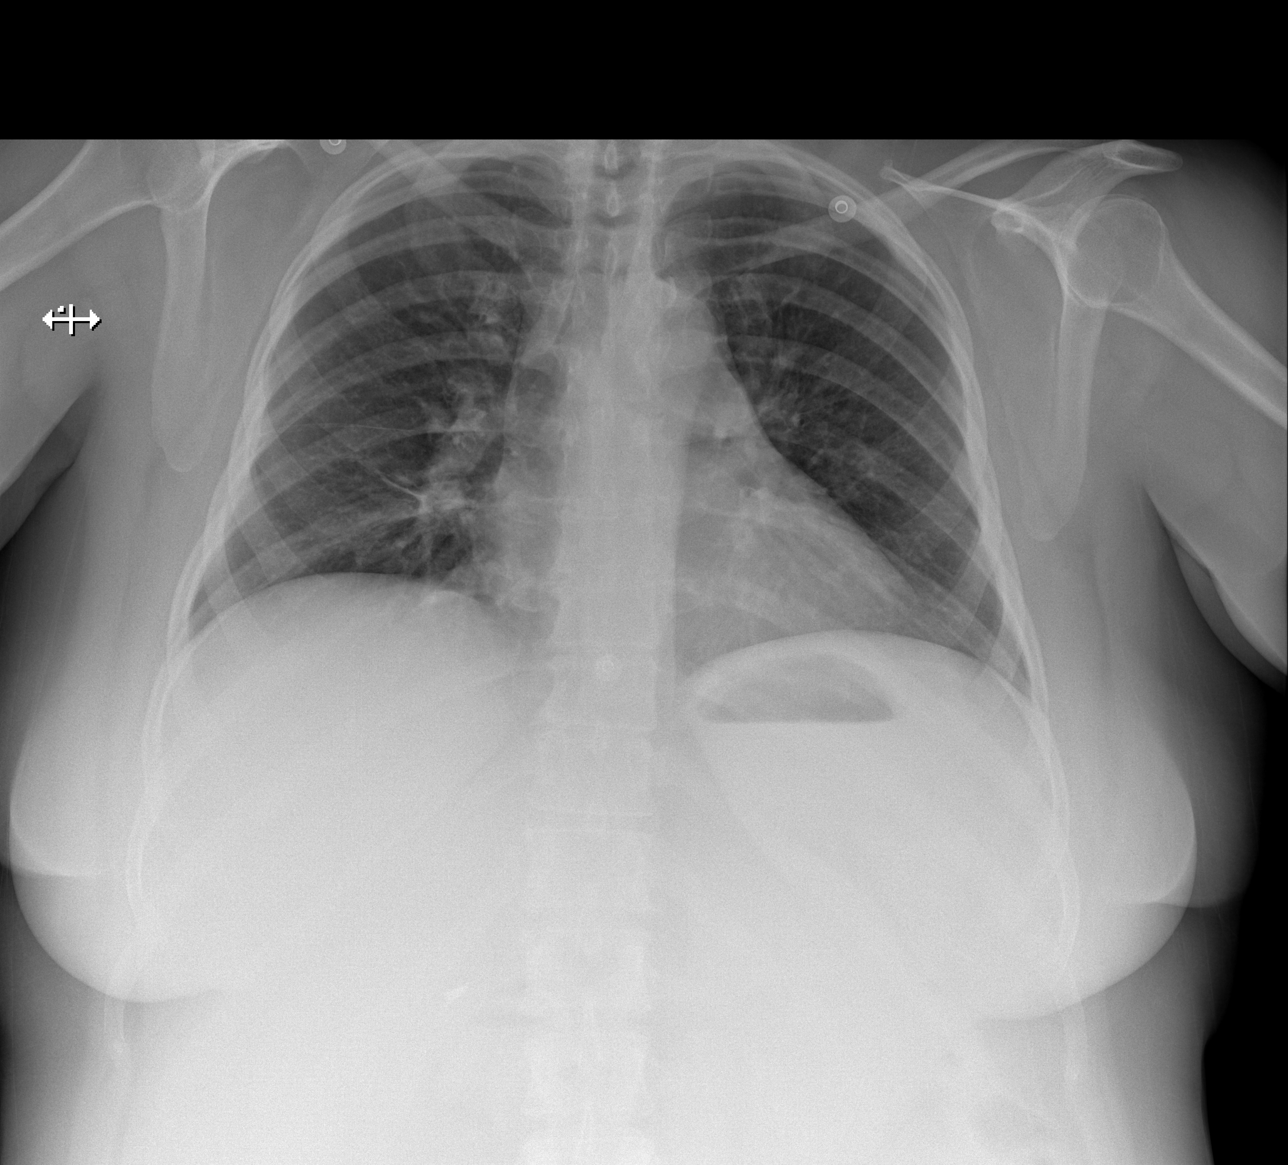
[im 2/3]
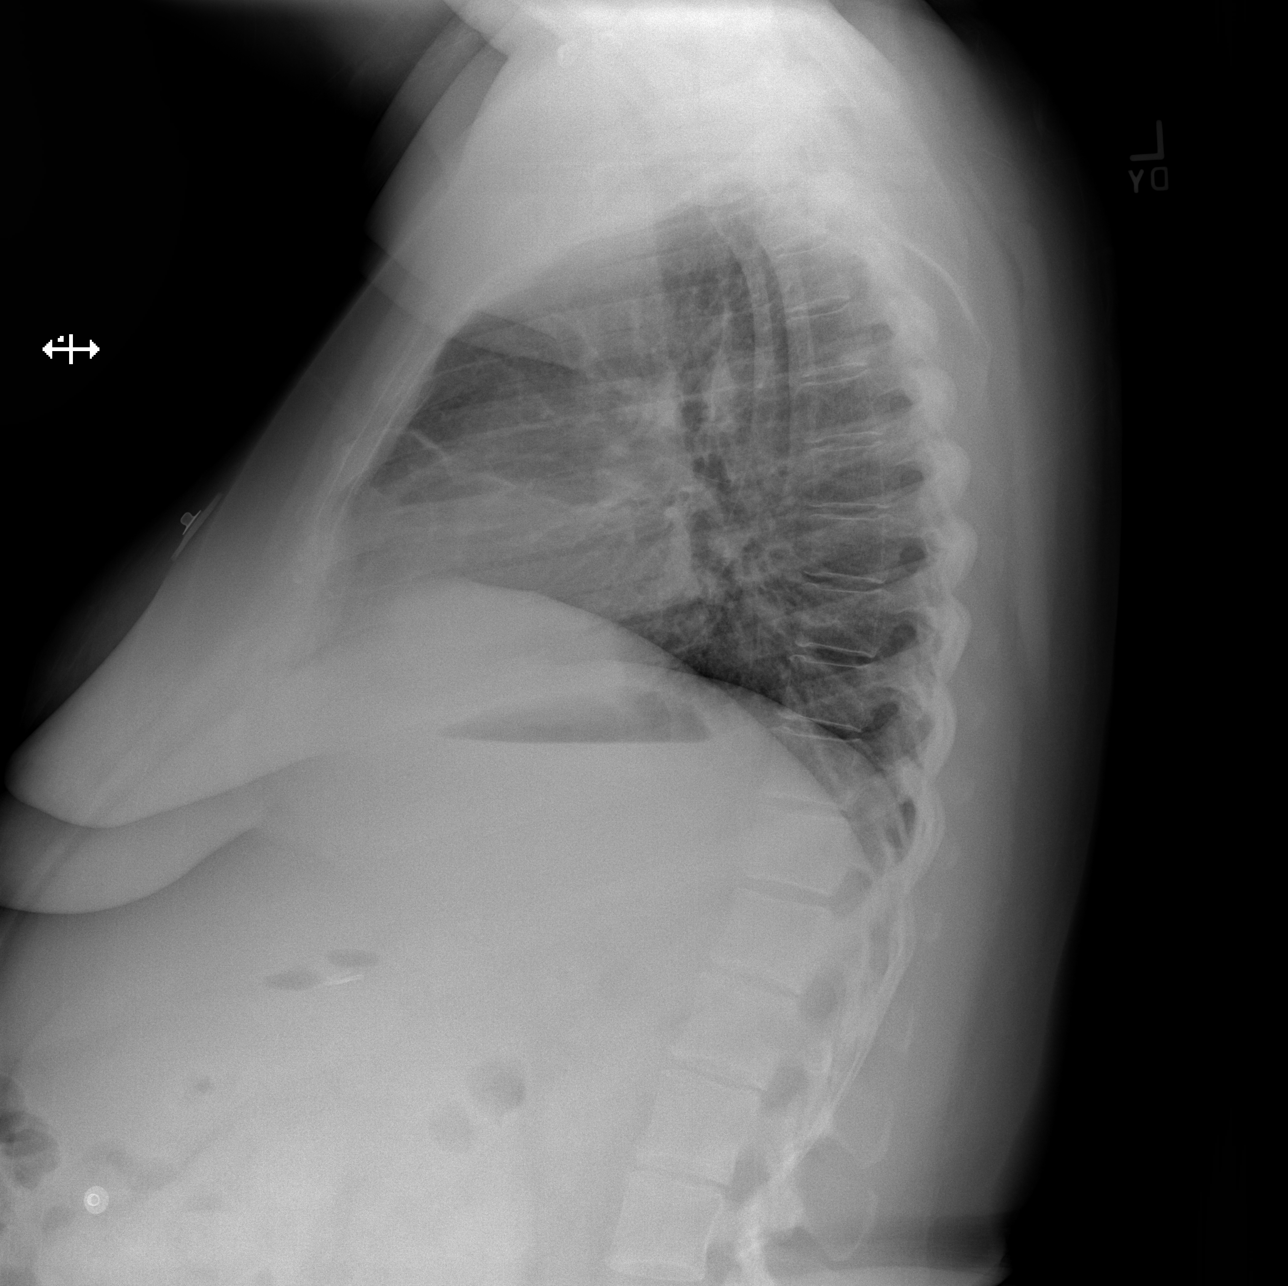
[im 3/3]
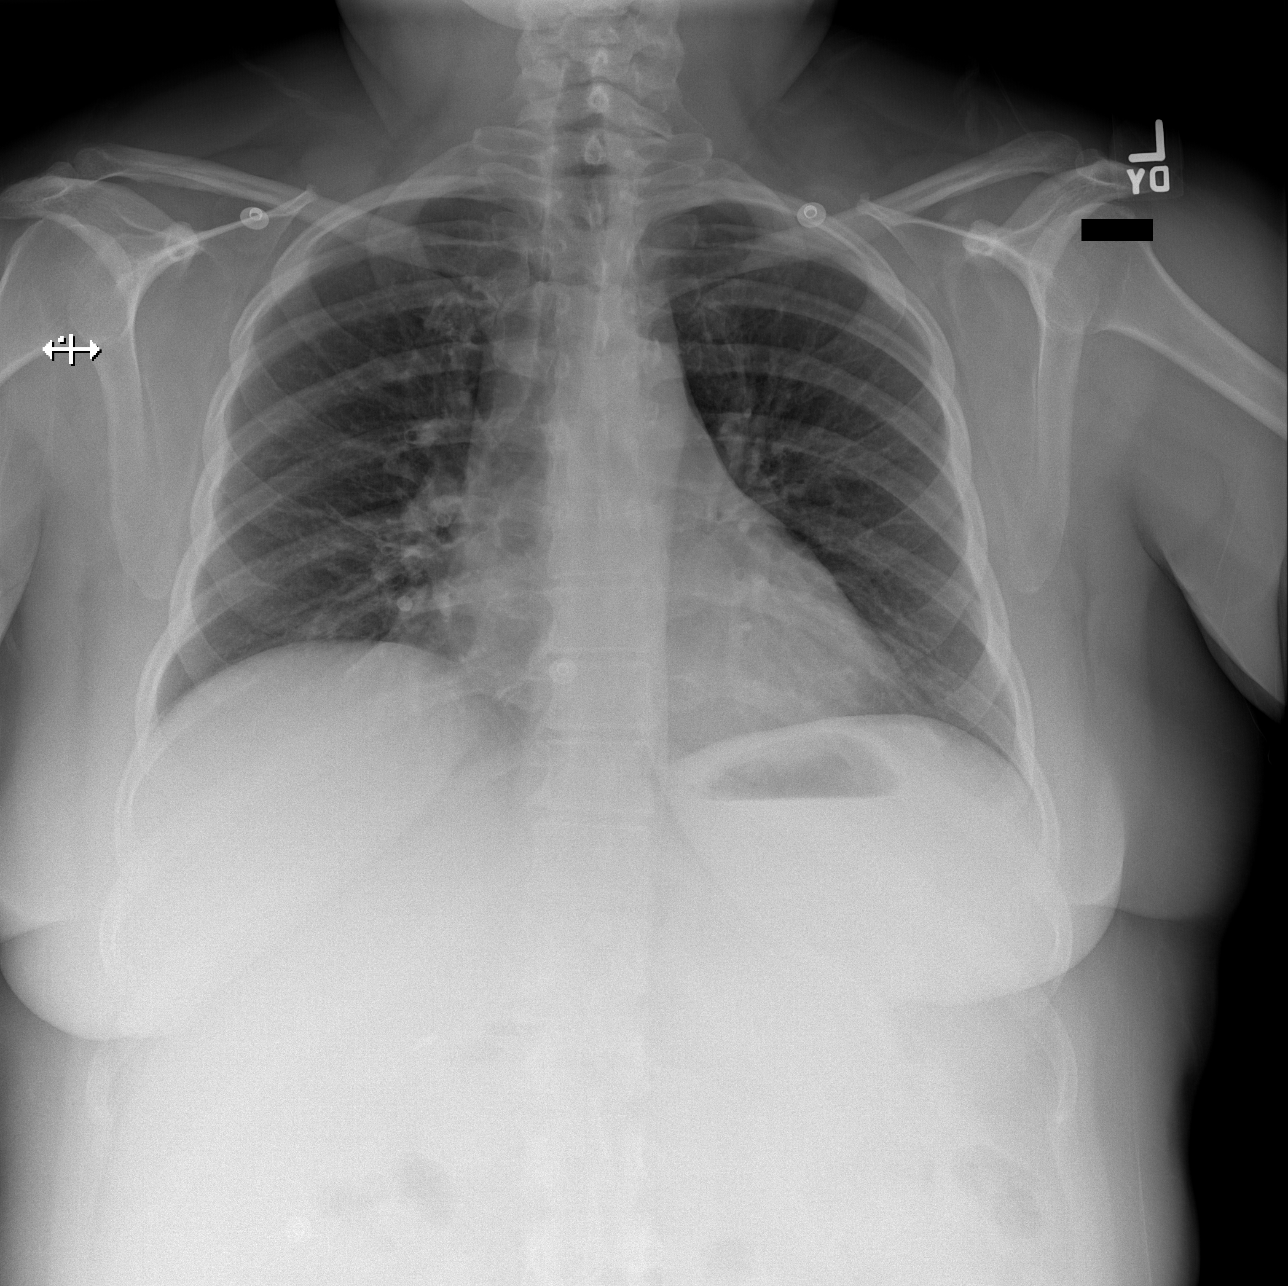

[3 of 3 positions shown; findings below may reference images not displayed]

FINDINGS: Limited inspiratory effect. Heart size and vascular pattern are
normal. Mild linear density right middle lobe appears most
consistent with scarring. No consolidation or effusion.
IMPRESSION: Mild discoid atelectasis right middle lobe. No acute findings
otherwise.

## 2015-04-30 ENCOUNTER — Ambulatory Visit: Payer: Medicare Other | Admitting: Family Medicine

## 2015-04-30 DIAGNOSIS — E119 Type 2 diabetes mellitus without complications: Secondary | ICD-10-CM | POA: Diagnosis not present

## 2015-05-05 ENCOUNTER — Other Ambulatory Visit: Payer: Self-pay

## 2015-05-05 DIAGNOSIS — G4730 Sleep apnea, unspecified: Secondary | ICD-10-CM | POA: Insufficient documentation

## 2015-05-05 DIAGNOSIS — I38 Endocarditis, valve unspecified: Secondary | ICD-10-CM | POA: Insufficient documentation

## 2015-05-05 DIAGNOSIS — I10 Essential (primary) hypertension: Secondary | ICD-10-CM | POA: Insufficient documentation

## 2015-05-11 ENCOUNTER — Emergency Department
Admission: EM | Admit: 2015-05-11 | Discharge: 2015-05-11 | Disposition: A | Discharge status: Home / Self Care | Payer: Medicare Other | Attending: Emergency Medicine | Admitting: Emergency Medicine

## 2015-05-11 ENCOUNTER — Encounter: Payer: Self-pay | Admitting: Emergency Medicine

## 2015-05-11 DIAGNOSIS — Y998 Other external cause status: Secondary | ICD-10-CM | POA: Diagnosis not present

## 2015-05-11 DIAGNOSIS — I129 Hypertensive chronic kidney disease with stage 1 through stage 4 chronic kidney disease, or unspecified chronic kidney disease: Secondary | ICD-10-CM | POA: Diagnosis not present

## 2015-05-11 DIAGNOSIS — K769 Liver disease, unspecified: Secondary | ICD-10-CM

## 2015-05-11 DIAGNOSIS — Z794 Long term (current) use of insulin: Secondary | ICD-10-CM | POA: Insufficient documentation

## 2015-05-11 DIAGNOSIS — F319 Bipolar disorder, unspecified: Secondary | ICD-10-CM | POA: Insufficient documentation

## 2015-05-11 DIAGNOSIS — T50901A Poisoning by unspecified drugs, medicaments and biological substances, accidental (unintentional), initial encounter: Secondary | ICD-10-CM | POA: Diagnosis not present

## 2015-05-11 DIAGNOSIS — Y9289 Other specified places as the place of occurrence of the external cause: Secondary | ICD-10-CM | POA: Diagnosis not present

## 2015-05-11 DIAGNOSIS — E118 Type 2 diabetes mellitus with unspecified complications: Secondary | ICD-10-CM

## 2015-05-11 DIAGNOSIS — F1910 Other psychoactive substance abuse, uncomplicated: Secondary | ICD-10-CM | POA: Insufficient documentation

## 2015-05-11 DIAGNOSIS — E119 Type 2 diabetes mellitus without complications: Secondary | ICD-10-CM | POA: Insufficient documentation

## 2015-05-11 DIAGNOSIS — N183 Chronic kidney disease, stage 3 (moderate): Secondary | ICD-10-CM | POA: Diagnosis not present

## 2015-05-11 DIAGNOSIS — T43211A Poisoning by selective serotonin and norepinephrine reuptake inhibitors, accidental (unintentional), initial encounter: Secondary | ICD-10-CM | POA: Diagnosis not present

## 2015-05-11 DIAGNOSIS — T50991A Poisoning by other drugs, medicaments and biological substances, accidental (unintentional), initial encounter: Secondary | ICD-10-CM | POA: Diagnosis not present

## 2015-05-11 DIAGNOSIS — Y9389 Activity, other specified: Secondary | ICD-10-CM | POA: Insufficient documentation

## 2015-05-11 DIAGNOSIS — Z79899 Other long term (current) drug therapy: Secondary | ICD-10-CM | POA: Insufficient documentation

## 2015-05-11 DIAGNOSIS — F1110 Opioid abuse, uncomplicated: Secondary | ICD-10-CM | POA: Insufficient documentation

## 2015-05-11 LAB — COMPREHENSIVE METABOLIC PANEL
ALBUMIN: 4.1 g/dL (ref 3.5–5.0)
ALK PHOS: 67 U/L (ref 38–126)
ALT: 34 U/L (ref 14–54)
ANION GAP: 10 (ref 5–15)
AST: 33 U/L (ref 15–41)
BUN: 26 mg/dL — ABNORMAL HIGH (ref 6–20)
CO2: 25 mmol/L (ref 22–32)
Calcium: 9.5 mg/dL (ref 8.9–10.3)
Chloride: 100 mmol/L — ABNORMAL LOW (ref 101–111)
Creatinine, Ser: 1.18 mg/dL — ABNORMAL HIGH (ref 0.44–1.00)
GFR calc Af Amer: >60 mL/min (ref >60)
GFR calc non Af Amer: 52 mL/min — ABNORMAL LOW (ref >60)
GLUCOSE: 186 mg/dL — AB (ref 65–99)
POTASSIUM: 3.7 mmol/L (ref 3.5–5.1)
SODIUM: 135 mmol/L (ref 135–145)
Total Bilirubin: 0.4 mg/dL (ref 0.3–1.2)
Total Protein: 7.1 g/dL (ref 6.5–8.1)

## 2015-05-11 LAB — URINALYSIS COMPLETE WITH MICROSCOPIC (ARMC ONLY)
Bilirubin Urine: NEGATIVE
GLUCOSE, UA: NEGATIVE mg/dL
HGB URINE DIPSTICK: NEGATIVE
Ketones, ur: NEGATIVE mg/dL
Nitrite: NEGATIVE
PH: 5 (ref 5.0–8.0)
Protein, ur: NEGATIVE mg/dL
Specific Gravity, Urine: 1.023 (ref 1.005–1.030)

## 2015-05-11 LAB — CBC WITH DIFFERENTIAL/PLATELET
BASOS PCT: 1 %
Basophils Absolute: 0.1 10*3/uL (ref 0–0.1)
EOS PCT: 1 %
Eosinophils Absolute: 0.1 10*3/uL (ref 0–0.7)
HCT: 33.8 % — ABNORMAL LOW (ref 35.0–47.0)
HEMOGLOBIN: 10.9 g/dL — AB (ref 12.0–16.0)
LYMPHS PCT: 37 %
Lymphs Abs: 2.4 10*3/uL (ref 1.0–3.6)
MCH: 28 pg (ref 26.0–34.0)
MCHC: 32.4 g/dL (ref 32.0–36.0)
MCV: 86.7 fL (ref 80.0–100.0)
MONOS PCT: 8 %
Monocytes Absolute: 0.5 10*3/uL (ref 0.2–0.9)
NEUTROS PCT: 53 %
Neutro Abs: 3.4 10*3/uL (ref 1.4–6.5)
Platelets: 331 10*3/uL (ref 150–440)
RBC: 3.9 MIL/uL (ref 3.80–5.20)
RDW: 15 % — ABNORMAL HIGH (ref 11.5–14.5)
WBC: 6.5 10*3/uL (ref 3.6–11.0)

## 2015-05-11 LAB — URINE DRUG SCREEN, QUALITATIVE (ARMC ONLY)
AMPHETAMINES, UR SCREEN: NOT DETECTED
BARBITURATES, UR SCREEN: NOT DETECTED
BENZODIAZEPINE, UR SCRN: NOT DETECTED
CANNABINOID 50 NG, UR ~~LOC~~: NOT DETECTED
Cocaine Metabolite,Ur ~~LOC~~: NOT DETECTED
MDMA (Ecstasy)Ur Screen: NOT DETECTED
Methadone Scn, Ur: NOT DETECTED
OPIATE, UR SCREEN: POSITIVE — AB
PHENCYCLIDINE (PCP) UR S: NOT DETECTED
Tricyclic, Ur Screen: POSITIVE — AB

## 2015-05-11 LAB — ETHANOL: Alcohol, Ethyl (B): <5 mg/dL (ref <5)

## 2015-05-11 LAB — SALICYLATE LEVEL: Salicylate Lvl: <4 mg/dL (ref 2.8–30.0)

## 2015-05-11 LAB — ACETAMINOPHEN LEVEL: Acetaminophen (Tylenol), Serum: <10 ug/mL — ABNORMAL LOW (ref 10–30)

## 2015-05-11 MED ORDER — ACTIDOSE WITH SORBITOL 50 GM/240ML PO LIQD
25.0000 g | Freq: Once | ORAL | Status: AC
  Administered 2015-05-11: 25 g via ORAL
  Filled 2015-05-11: qty 120

## 2015-05-11 MED ORDER — CEPHALEXIN 500 MG PO CAPS: 500.0000 mg | ORAL_CAPSULE | Freq: Four times a day (QID) | ORAL | Status: DC

## 2015-05-11 MED ORDER — ACTIDOSE WITH SORBITOL 50 GM/240ML PO LIQD
50.0000 g | Freq: Once | ORAL | Status: DC
  Filled 2015-05-11: qty 240

## 2015-05-11 NOTE — ED Notes (Signed)
Reports getting confused and took her husbands meds this am instead of hers. States her husband has a pill bottle set up for her with all her morning meds in it and thought she was taking those.  Then realized she took all of the (26) pills in his sertraline bottle.  Reports weak feeling in legs, abd pain and dizziness.

## 2015-05-11 NOTE — Consult Note (Signed)
[facility] Face-to-Face Psychiatry Consult   Reason for Consult:  Consult for this 49 year old woman with a history of bipolar disorder who presents after an accidental medication overdose Referring Physician:  Dr. Thomasene Lot Patient Identification: [name] MRN:  [id number] Principal Diagnosis: Accidental medication overdose Diagnosis:   Patient Active Problem List   Diagnosis Date Noted  . Accidental medication overdose [T50.901A] 05/11/2015  . Bipolar disorder [F31.9] 05/11/2015  . Liver disease [K76.9] 05/11/2015  . Diabetes [E11.9] 05/11/2015  . BP (high blood pressure) [I10] 05/05/2015  . Apnea, sleep [G47.30] 05/05/2015  . Heart valve disease [I38] 05/05/2015  . Urinary tract infection [N39.0] 03/24/2015  . Chronic kidney disease, stage III (moderate) [N18.3] 03/15/2015  . Anemia [D64.9] 03/15/2015  . Migraine [G43.909] 03/12/2015  . Backache [M54.9] 03/12/2015  . COPD (chronic obstructive pulmonary disease) [J44.9] 01/20/2015  . 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 GERD (gastroesophageal reflux disease) [K21.9] 01/20/2015  . Disorder of smooth muscle [M62.9] 01/20/2015  . Hepatic encephalopathy [K72.90] 01/20/2015  . Hypothyroid [E03.9]   . Morbid obesity [E66.01]   . OSA (obstructive sleep apnea) [G47.33]   . RLS (restless legs syndrome) [G25.81]   . Bipolar depression [F31.30]   . Vitamin D deficiency [E55.9]   . Urinary incontinence [R32]   . Liver lesion [K76.89]   . Acute renal failure [N17.9]   . Hypomagnesemia [E83.42]   . Dyspnea [R06.00] 10/20/2013  . Obstructive sleep apnea [G47.33] 10/20/2013  . PERIPHERAL NEUROPATHY [G60.9] 03/04/2009  . HEMORRHOIDS, EXTERNAL [K64.8] 09/03/2008  . MORBID OBESITY [E66.01] 10/14/2007  . ALLERGIC RHINITIS [J30.9] 07/04/2007  . ASTHMA, PERSISTENT, MODERATE [J45.909] 07/04/2007  . INTERSTITIAL CYSTITIS [N30.10] 07/04/2007  . HYPERTRIGLYCERIDEMIA [E78.1] 07/03/2007  . FATIGUE, CHRONIC [R53.81, R53.83] 07/03/2007   . BIPOLAR AFFECTIVE DISORDER, HX OF [Z86.59] 07/03/2007  . DM [E11.9] 06/19/2007  . HYPERTENSION, BENIGN ESSENTIAL [I10] 06/19/2007    Total Time spent with patient: 1 hour  Subjective:   [name] is a 49 y.o. female patient admitted with "really was an accident".  HPI:  Information from patient and the chart. Reviewed labs. Reviewed vital signs. Patient examined. Patient came into the hospital this morning stating that she had accidentally taken an overdose of her husband's medication. She says that when she woke up this morning she was still feeling tired and not paying close enough attention. She picked up a bottle off the bathroom counter and swallow down the pills in it thinking it was her normal morning medicine. She states that this is how her husband usually lays them out for her is to put her morning medicine in a single pill bottle for her. As it turns out by accident she had taken her husband's medication swallowing approximately 26 of the 50 mg drink Zoloft. She went back to bed and later on woke up and took her own medicine but by then it was discovered that she had accidentally taken the overdose and they came into the emergency room. Patient denies that she has been depressed. Denies that she has at any suicidal thoughts. Denies that she has had any difficulty sleeping or any psychotic symptoms. She says that she has been medically feeling well. States that this was just an accident. I spoke with her husband who says that in his opinion it was an accident as well he has no concern that this was intentional.  Past psychiatric history: 49 year old woman with bipolar disorder. She has a history of mood instability and depression. Positive past history  of suicide attempts and positive past history of hospitalizations. Past history of liver failure related to Depakote has resulted in some changes to her medicine. Currently sees Dr. Randel Books for outpatient care.  Medical history:  Diabetes and high blood pressure elevated triglycerides obesity history of liver insufficiency  Family history: None known  Social history: Lives with her husband. Says that her recent social life has been calm and appropriate. Denies that she's had any major new life stresses.  Substance abuse history: Does not drink does not abuse drugs. No significant known past history of substance abuse issues.  Current medications: Apparently she is currently taking lamotrigine and Seroquel and Cymbalta and trazodone as her main psychiatric medicines as well as several other medicines for her other medical problems. HPI Elements:   Quality:  Overdose of medication. Severity:  Potentially life threatening. Timing:  In this morning. Duration:  Resolved. Context:  Confusion regarding her medicine.  Past Medical History:  Past Medical History  Diagnosis Date  . High blood pressure   . Heart attack   . Irregular heart rhythm   . Seasonal allergies   . Chronic headaches   . Sleep apnea   . Hyperlipidemia   . COPD (chronic obstructive pulmonary disease)   . Asthma   . Hypothyroid   . Morbid obesity   . OSA (obstructive sleep apnea)   . RLS (restless legs syndrome)   . GERD (gastroesophageal reflux disease)   . Bipolar depression   . Vitamin D deficiency   . Urinary incontinence   . Hypoxemia   . Liver lesion     Favored to be benign on CT; MRI of abd w/contract in Aug 2016. Liver Biopsy-Negative, March 2016  . Acute renal failure   . Hypomagnesemia   . Hypertension   . Headache   . Diabetes mellitus without complication     Type 2, insulin dependent. Patient also takes Metformin.  . CHF (congestive heart failure)     Past Surgical History  Procedure Laterality Date  . Abdominal hysterectomy    . Tubal ligation    . Liver biopsy  March 2016    Negative  . Abdominal hysterectomy      Partial  . Cholecystectomy    . Bladder surgery      x 2  . Lung biopsy     Family History:   Family History  Problem Relation Age of Onset  . Heart disease Maternal Grandfather   . Rheum arthritis Maternal Grandfather   . Cancer Maternal Grandfather     liver  . Cancer Paternal Grandmother     lung  . Diabetes Mother   . Heart disease Mother   . Hyperlipidemia Mother   . Hypertension Mother   . Kidney disease Mother   . Mental illness Mother   . Hypothyroidism Mother   . Stroke Mother   . Osteoporosis Mother   . Glaucoma Mother   . Hypertension Father   . Asthma Daughter   . Cancer Daughter     throat   Social History:  History  Alcohol Use No     History  Drug Use No    Social History   Social History  . Marital Status: Married    Spouse Name: N/A  . Number of Children: 2  . Years of Education: 12   Occupational History  . disabled    Social History Main Topics  . Smoking status: Never Smoker   . Smokeless tobacco: Never Used  .  Alcohol Use: No  . Drug Use: No  . Sexual Activity: Not Asked   Other Topics Concern  . None   Social History Narrative   ** Merged History Encounter **       16-20 oz of caffeine a day    Additional Social History:                          Allergies:   Allergies  Allergen Reactions  . Morphine And Related     anaphylaxis  . Codeine     REACTION: itch, rash  . Codeine     itching  . Hydrocodone-Acetaminophen     REACTION: rash  . Mirtazapine     REACTION: closes throat  . Morphine And Related   . Sulfamethoxazole-Trimethoprim     REACTION: [place] all over  . Vicodin [Hydrocodone-Acetaminophen]     itching    Labs:  Results for orders placed or performed during the hospital encounter of 05/11/15 (from the past 48 hour(s))  Urine Drug Screen, Qualitative     Status: Abnormal   Collection Time: 05/11/15 11:24 AM  Result Value Ref Range   Tricyclic, Ur Screen POSITIVE (A) NONE DETECTED   Amphetamines, Ur Screen NONE DETECTED NONE DETECTED   MDMA (Ecstasy)Ur Screen NONE DETECTED NONE  DETECTED   Cocaine Metabolite,Ur [place] NONE DETECTED NONE DETECTED   Opiate, Ur Screen POSITIVE (A) NONE DETECTED   Phencyclidine (PCP) Ur S NONE DETECTED NONE DETECTED   Cannabinoid 50 Ng, Ur [place] NONE DETECTED NONE DETECTED   Barbiturates, Ur Screen NONE DETECTED NONE DETECTED   Benzodiazepine, Ur Scrn NONE DETECTED NONE DETECTED   Methadone Scn, Ur NONE DETECTED NONE DETECTED    Comment: (NOTE) 428  Tricyclics, urine               Cutoff 1000 ng/mL 200  Amphetamines, urine             Cutoff 1000 ng/mL 300  MDMA (Ecstasy), urine           Cutoff 500 ng/mL 400  Cocaine Metabolite, urine       Cutoff 300 ng/mL 500  Opiate, urine                   Cutoff 300 ng/mL 600  Phencyclidine (PCP), urine      Cutoff 25 ng/mL 700  Cannabinoid, urine              Cutoff 50 ng/mL 800  Barbiturates, urine             Cutoff 200 ng/mL 900  Benzodiazepine, urine           Cutoff 200 ng/mL 1000 Methadone, urine                Cutoff 300 ng/mL 1100 1200 The urine drug screen provides only a preliminary, unconfirmed 1300 analytical test result and should not be used for non-medical 1400 purposes. Clinical consideration and professional judgment should 1500 be applied to any positive drug screen result due to possible 1600 interfering substances. A more specific alternate chemical method 1700 must be used in order to obtain a confirmed analytical result.  1800 Gas chromato graphy / mass spectrometry (GC/MS) is the preferred 1900 confirmatory method.   Urinalysis complete, with microscopic     Status: Abnormal   Collection Time: 05/11/15 11:24 AM  Result Value Ref Range   Color, Urine YELLOW (A) YELLOW   APPearance  CLOUDY (A) CLEAR   Glucose, UA NEGATIVE NEGATIVE mg/dL   Bilirubin Urine NEGATIVE NEGATIVE   Ketones, ur NEGATIVE NEGATIVE mg/dL   Specific Gravity, Urine 1.023 1.005 - 1.030   Hgb urine dipstick NEGATIVE NEGATIVE   pH 5.0 5.0 - 8.0   Protein, ur NEGATIVE NEGATIVE mg/dL   Nitrite NEGATIVE  NEGATIVE   Leukocytes, UA 2+ (A) NEGATIVE   RBC / HPF 0-5 0 - 5 RBC/hpf   WBC, UA TOO NUMEROUS TO COUNT 0 - 5 WBC/hpf   Bacteria, UA FEW (A) NONE SEEN   Squamous Epithelial / LPF 6-30 (A) NONE SEEN   Mucous PRESENT    Hyaline Casts, UA PRESENT   Comprehensive metabolic panel     Status: Abnormal   Collection Time: 05/11/15 11:24 AM  Result Value Ref Range   Sodium 135 135 - 145 mmol/L   Potassium 3.7 3.5 - 5.1 mmol/L   Chloride 100 (L) 101 - 111 mmol/L   CO2 25 22 - 32 mmol/L   Glucose, Bld 186 (H) 65 - 99 mg/dL   BUN 26 (H) 6 - 20 mg/dL   Creatinine, Ser 1.18 (H) 0.44 - 1.00 mg/dL   Calcium 9.5 8.9 - 10.3 mg/dL   Total Protein 7.1 6.5 - 8.1 g/dL   Albumin 4.1 3.5 - 5.0 g/dL   AST 33 15 - 41 U/L   ALT 34 14 - 54 U/L   Alkaline Phosphatase 67 38 - 126 U/L   Total Bilirubin 0.4 0.3 - 1.2 mg/dL   GFR calc non Af Amer 52 (L) >60 mL/min   GFR calc Af Amer >60 >60 mL/min    Comment: (NOTE) The eGFR has been calculated using the CKD EPI equation. This calculation has not been validated in all clinical situations. eGFR's persistently <60 mL/min signify possible Chronic Kidney Disease.    Anion gap 10 5 - 15  Ethanol     Status: None   Collection Time: 05/11/15 11:24 AM  Result Value Ref Range   Alcohol, Ethyl (B) <5 <5 mg/dL    Comment:        LOWEST DETECTABLE LIMIT FOR SERUM ALCOHOL IS 5 mg/dL FOR MEDICAL PURPOSES ONLY   CBC with Differential     Status: Abnormal (Preliminary result)   Collection Time: 05/11/15 11:24 AM  Result Value Ref Range   WBC 6.5 3.6 - 11.0 K/uL   RBC 3.90 3.80 - 5.20 MIL/uL   Hemoglobin 10.9 (L) 12.0 - 16.0 g/dL   HCT 33.8 (L) 35.0 - 47.0 %   MCV 86.7 80.0 - 100.0 fL   MCH 28.0 26.0 - 34.0 pg   MCHC 32.4 32.0 - 36.0 g/dL   RDW 15.0 (H) 11.5 - 14.5 %   Platelets 331 150 - 440 K/uL   Neutrophils Relative % PENDING 43 - 77 %   Neutro Abs PENDING 1.7 - 7.7 K/uL   Band Neutrophils PENDING 0 - 10 %   Lymphocytes Relative PENDING 12 - 46 %    Lymphs Abs PENDING 0.7 - 4.0 K/uL   Monocytes Relative PENDING 3 - 12 %   Monocytes Absolute PENDING 0.1 - 1.0 K/uL   Eosinophils Relative PENDING 0 - 5 %   Eosinophils Absolute PENDING 0.0 - 0.7 K/uL   Basophils Relative PENDING 0 - 1 %   Basophils Absolute PENDING 0.0 - 0.1 K/uL   WBC Morphology PENDING    RBC Morphology PENDING    Smear Review PENDING    Other PENDING %  nRBC PENDING 0 /100 WBC   Metamyelocytes Relative PENDING %   Myelocytes PENDING %   Promyelocytes Absolute PENDING %   Blasts PENDING %  Acetaminophen level     Status: Abnormal   Collection Time: 05/11/15 11:24 AM  Result Value Ref Range   Acetaminophen (Tylenol), Serum <10 (L) 10 - 30 ug/mL    Comment:        THERAPEUTIC CONCENTRATIONS VARY SIGNIFICANTLY. A RANGE OF 10-30 ug/mL MAY BE AN EFFECTIVE CONCENTRATION FOR MANY PATIENTS. HOWEVER, SOME ARE BEST TREATED AT CONCENTRATIONS OUTSIDE THIS RANGE. ACETAMINOPHEN CONCENTRATIONS >150 ug/mL AT 4 HOURS AFTER INGESTION AND >50 ug/mL AT 12 HOURS AFTER INGESTION ARE OFTEN ASSOCIATED WITH TOXIC REACTIONS.   Salicylate level     Status: None   Collection Time: 05/11/15 11:24 AM  Result Value Ref Range   Salicylate Lvl <9.3 2.8 - 30.0 mg/dL    Vitals: Blood pressure 110/76, pulse 73, temperature 98 F (36.7 C), temperature source Oral, resp. rate 18, height _0  (1.499 m), weight 82.101 kg (181 lb), SpO2 97 %.  Risk to Self: Is patient at risk for suicide?: No Risk to Others:   Prior Inpatient Therapy:   Prior Outpatient Therapy:    No current facility-administered medications for this encounter.   Current Outpatient Prescriptions  Medication Sig Dispense Refill  . Choline Fenofibrate (FENOFIBRIC ACID) 45 MG CPDR Take 1 capsule by mouth daily.    . DULoxetine (CYMBALTA) 60 MG capsule Take 120 mg by mouth daily.    Marland Kitchen HYDROcodone-acetaminophen (NORCO/VICODIN) 5-325 MG per tablet Take 1 tablet by mouth every 4 (four) hours as needed for moderate  pain.    Marland Kitchen insulin lispro (HUMALOG) 100 UNIT/ML injection Inject into the skin 3 (three) times daily as needed for high blood sugar.    . insulin NPH Human (HUMULIN N,NOVOLIN N) 100 UNIT/ML injection Inject into the skin. 75 units in the am, 50 units in at lunch, 75 units at night    . lamoTRIgine (LAMICTAL) 150 MG tablet Take 150 mg by mouth daily.     Marland Kitchen levothyroxine (SYNTHROID, LEVOTHROID) 75 MCG tablet TAKE 1 TABLET BY MOUTH EVERY DAY. 30 tablet 11  . magnesium oxide (MAG-OX) 400 MG tablet Take 400 mg by mouth 2 (two) times daily.    . metoprolol tartrate (LOPRESSOR) 25 MG tablet Take 12.5 mg by mouth 2 (two) times daily.    . Multiple Vitamin (MULTIVITAMIN) capsule Take by mouth.    . Omega-3-Acid Eth Est, Dietary, 1 G CAPS Take by mouth.    . pantoprazole (PROTONIX) 40 MG tablet Take 40 mg by mouth daily.    . QUEtiapine (SEROQUEL) 25 MG tablet Take 25 mg by mouth every morning. Take one in am, one at lunch    . QUEtiapine (SEROQUEL) 400 MG tablet Take 800 mg by mouth at bedtime. Take 2 tabs at bedtime    . quinapril (ACCUPRIL) 5 MG tablet Take 5 mg by mouth daily.    Marland Kitchen topiramate (TOPAMAX) 25 MG tablet Take 1 tablet (25 mg total) by mouth 2 (two) times daily. 120 tablet 3  . traMADol (ULTRAM) 50 MG tablet Take 2 tablets (100 mg total) by mouth every 6 (six) hours as needed. 30 tablet 1  . traZODone (DESYREL) 100 MG tablet Take 100 mg by mouth at bedtime.    . ALPRAZolam (XANAX) 0.25 MG tablet Take 0.25 mg by mouth 2 (two) times daily.      Musculoskeletal: Strength & Muscle Tone: within  normal limits Gait & Station: normal Patient leans: N/A  Psychiatric Specialty Exam: Physical Exam  Nursing note and vitals reviewed. Constitutional: She appears well-developed and well-nourished.  HENT:  Head: Normocephalic and atraumatic.  Eyes: Conjunctivae are normal. Pupils are equal, round, and reactive to light.  Neck: Normal range of motion.  Cardiovascular: Normal heart sounds.    Respiratory: Effort normal.  GI: Soft.  Musculoskeletal: Normal range of motion.  Neurological: She is alert.  Skin: Skin is warm and dry.  Psychiatric: She has a normal mood and affect. Her speech is normal and behavior is normal. Judgment and thought content normal. Cognition and memory are normal.    Review of Systems  Constitutional: Negative.   HENT: Negative.   Eyes: Negative.   Respiratory: Negative.   Cardiovascular: Negative.   Gastrointestinal: Negative.   Musculoskeletal: Negative.   Skin: Negative.   Neurological: Positive for dizziness.  Psychiatric/Behavioral: Negative for depression, suicidal ideas, hallucinations, memory loss and substance abuse. The patient is not nervous/anxious and does not have insomnia.     Blood pressure 110/76, pulse 73, temperature 98 F (36.7 C), temperature source Oral, resp. rate 18, height _0  (1.499 m), weight 82.101 kg (181 lb), SpO2 97 %.Body mass index is 36.54 kg/(m^2).  General Appearance: Fairly Groomed  Engineer, water::  Good  Speech:  Clear and Coherent  Volume:  Normal  Mood:  Euthymic  Affect:  Congruent  Thought Process:  Goal Directed  Orientation:  Full (Time, Place, and Person)  Thought Content:  Negative  Suicidal Thoughts:  No  Homicidal Thoughts:  No  Memory:  Immediate;   Good Recent;   Good Remote;   Good  Judgement:  Fair  Insight:  Fair  Psychomotor Activity:  Normal  Concentration:  Fair  Recall:  [employer] of Knowledge:Fair  Language: Fair  Akathisia:  No  Handed:  Right  AIMS (if indicated):     Assets:  Communication Skills Desire for Improvement Financial Resources/Insurance Housing Intimacy Resilience Social Support  ADL's:  Intact  Cognition: WNL  Sleep:      Medical Decision Making: New problem, with additional work up planned, Review of Psycho-Social Stressors (1), Review or order clinical lab tests (1), Review and summation of old records (2) and Review of Medication Regimen & Side  Effects (2)  Treatment Plan Summary: Medication management and Plan Patient interviewed. Chart reviewed. Old notes reviewed. Looked at her labs look better vital signs. Case discussed with emergency room doctor. Patient allowed me to speak to her husband who independently confirms his belief that she had not tried to harm herself. She is not currently presenting with any symptoms of depression or psychosis. It appears that this was an overdose by accident. Appears to of had no long-standing harm done. Her liver enzymes are looking good. Vital signs stable. Patient was counseled about her medication and the importance of being very careful with compliance and the risks of accidental overdose. No indication for involuntary commitment or hospitalization. She is to follow-up with her outpatient provider in the community. Blood sugars are still running a little bit high. No change however to any of her ordered medicines.  Plan:  No evidence of imminent risk to self or others at present.   Patient does not meet criteria for psychiatric inpatient admission. Supportive therapy provided about ongoing stressors. Discussed crisis plan, support from social network, calling 911, coming to the Emergency Department, and calling Suicide Hotline. Disposition: Discharge at the discretion of the  emergency room physician with follow-up at [facility] 05/11/2015 12:45 PM

## 2015-05-11 NOTE — Discharge Instructions (Signed)
Follow-up with your usual health care providers, both psychiatric and medical. Return to the emergency department if you have any urgent concerns.  Accidental Overdose A drug overdose occurs when a chemical substance (drug or medication) is used in amounts large enough to overcome a person. This may result in severe illness or death. This is a type of poisoning. Accidental overdoses of medications or other substances come from a variety of reasons. When this happens accidentally, it is often because the person taking the substance does not know enough about what they have taken. Drugs which commonly cause overdose deaths are alcohol, psychotropic medications (medications which affect the mind), pain medications, illegal drugs (street drugs) such as cocaine and heroin, and multiple drugs taken at the same time. It may result from careless behavior (such as over-indulging at a party). Other causes of overdose may include multiple drug use, a lapse in memory, or drug use after a period of no drug use.  Sometimes overdosing occurs because a person cannot remember if they have taken their medication.  A common unintentional overdose in young children involves multi-vitamins containing iron. Iron is a part of the hemoglobin molecule in blood. It is used to transport oxygen to living cells. When taken in small amounts, iron allows the body to restock hemoglobin. In large amounts, it causes problems in the body. If this overdose is not treated, it can lead to death. Never take medicines that show signs of tampering or do not seem quite right. Never take medicines in the dark or in poor lighting. Read the label and check each dose of medicine before you take it. When adults are poisoned, it happens most often through carelessness or lack of information. Taking medicines in the dark or taking medicine prescribed for someone else to treat the same type of problem is a dangerous practice. SYMPTOMS  Symptoms of  overdose depend on the medication and amount taken. They can vary from over-activity with stimulant over-dosage, to sleepiness from depressants such as alcohol, narcotics and tranquilizers. Confusion, dizziness, nausea and vomiting may be present. If problems are severe enough coma and death may result. DIAGNOSIS  Diagnosis and management are generally straightforward if the drug is known. Otherwise it is more difficult. At times, certain symptoms and signs exhibited by the patient, or blood tests, can reveal the drug in question.  TREATMENT  In an emergency department, most patients can be treated with supportive measures. Antidotes may be available if there has been an overdose of opioids or benzodiazepines. A rapid improvement will often occur if this is the cause of overdose. At home or away from medical care:  There may be no immediate problems or warning signs in children.  Not everything works well in all cases of poisoning.  Take immediate action. Poisons may act quickly.  If you think someone has swallowed medicine or a household product, and the person is unconscious, having seizures (convulsions), or is not breathing, immediately call for an ambulance. IF a person is conscious and appears to be doing OK but has swallowed a poison:  Do not wait to see what effect the poison will have. Immediately call a poison control center (listed in the white pages of your telephone book under "Poison Control" or inside the front cover with other emergency numbers). Some poison control centers have TTY capability for the deaf. Check with your local center if you or someone in your family requires this service.  Keep the container so you can read the label  on the product for ingredients.  Describe what, when, and how much was taken and the age and condition of the person poisoned. Inform them if the person is vomiting, choking, drowsy, shows a change in color or temperature of skin, is conscious or  unconscious, or is convulsing.  Do not cause vomiting unless instructed by medical personnel. Do not induce vomiting or force liquids into a person who is convulsing, unconscious, or very drowsy. Stay calm and in control.   Activated charcoal also is sometimes used in certain types of poisoning and you may wish to add a supply to your emergency medicines. It is available without a prescription. Call a poison control center before using this medication. PREVENTION  Thousands of children die every year from unintentional poisoning. This may be from household chemicals, poisoning from carbon monoxide in a car, taking their parent's medications, or simply taking a few iron pills or vitamins with iron. Poisoning comes from unexpected sources.  Store medicines out of the sight and reach of children, preferably in a locked cabinet. Do not keep medications in a food cabinet. Always store your medicines in a secure place. Get rid of expired medications.  If you have children living with you or have them as occasional guests, you should have child-resistant caps on your medicine containers. Keep everything out of reach. Child proof your home.  If you are called to the telephone or to answer the door while you are taking a medicine, take the container with you or put the medicine out of the reach of small children.  Do not take your medication in front of children. Do not tell your child how good a medication is and how good it is for them. They may get the idea it is more of a treat.  If you are an adult and have accidentally taken an overdose, you need to consider how this happened and what can be done to prevent it from happening again. If this was from a street drug or alcohol, determine if there is a problem that needs addressing. If you are not sure a problems exists, it is easy to talk to a professional and ask them if they think you have a problem. It is better to handle this problem in this way before  it happens again and has a much worse consequence. Document Released: 11/04/2004 Document Revised: 11/13/2011 Document Reviewed: 04/12/2009 [facility] Patient Information 2015 [place], [place]. This information is not intended to replace advice given to you by your health care provider. Make sure you discuss any questions you have with your health care provider. Activated Charcoal Activated charcoal is a mixture of very fine charcoal particles that absorb and trap poisons or drugs and prevent them from passing into your blood. Activated charcoal is usually given once. It may also be mixed with a medicine (laxative)to help you have a bowel movement. This helps move the toxins through the gut quickly. The activated charcoal may upset your stomach, cause vomiting, diarrhea or even constipation. Your stool (feces) will usually be black for 1 to 2 days. Call your caregiver if you have any concerns about your treatment. Document Released: 09/28/2004 Document Revised: 11/13/2011 Document Reviewed: 06/18/2009 [facility] Patient Information 2015 [place], [place]. This information is not intended to replace advice given to you by your health care provider. Make sure you discuss any questions you have with your health care provider.

## 2015-05-11 NOTE — ED Notes (Signed)
Spoke with poison control [place]) who recommends charcoal and EKG. States to expect, drowsiness, GI upset, htn or tachycardia. Peak action 8 hours.  Pt took med at 8am.

## 2015-05-11 NOTE — ED Provider Notes (Addendum)
[facility] Emergency Department Provider Note  ____________________________________________  Time seen: 10:50 AM  I have reviewed the triage vital signs and the nursing notes.   HISTORY  Chief Complaint Drug Overdose  unintentional ingestion    HPI [name] is a 49 y.o. female with a history of bipolar disorder and who has been seen at [facility] in the past for psychiatric issues. She presents today reporting that when she turned to take her bottle of morning medications, she picked up a bottle of her husbands sertraline by accident, she threw all the pills at once into her mouth, and she was able to swallow them. After the fact, she felt a little funny and went to check her pill bottle and found it still full of pills and saw the sertraline bottle next to her pill bottle. This ingestion occurred around 7 AM this morning. After taking the large dose of sertraline, she reports she then took her regular medications.  The patient explains how this situation occurred: That her husband usually takes her morning and evening medicines and places all morning medicines into a single bottle. She reports that she is able to swallow a number of pills at once, which is why she did not balk when so many pills entered her mouth and she ingested them. She denies any suicidal thoughts or thoughts of self-harm.  The patient reports she feels a little bit fatigue and funny. She is alert and mentating well and does not appear somnolent currently.    Past Medical History  Diagnosis Date  . High blood pressure   . Heart attack   . Irregular heart rhythm   . Seasonal allergies   . Chronic headaches   . Sleep apnea   . Hyperlipidemia   . COPD (chronic obstructive pulmonary disease)   . Asthma   . Hypothyroid   . Morbid obesity   . OSA (obstructive sleep apnea)   . RLS (restless legs syndrome)   . GERD (gastroesophageal reflux disease)   . Bipolar depression   .  Vitamin D deficiency   . Urinary incontinence   . Hypoxemia   . Liver lesion     Favored to be benign on CT; MRI of abd w/contract in Aug 2016. Liver Biopsy-Negative, March 2016  . Acute renal failure   . Hypomagnesemia   . Hypertension   . Headache   . Diabetes mellitus without complication     Type 2, insulin dependent. Patient also takes Metformin.  . CHF (congestive heart failure)     Patient Active Problem List   Diagnosis Date Noted  . Accidental medication overdose 05/11/2015  . Bipolar disorder 05/11/2015  . Liver disease 05/11/2015  . Diabetes 05/11/2015  . BP (high blood pressure) 05/05/2015  . Apnea, sleep 05/05/2015  . Heart valve disease 05/05/2015  . Urinary tract infection 03/24/2015  . Chronic kidney disease, stage III (moderate) 03/15/2015  . Anemia 03/15/2015  . Migraine 03/12/2015  . Backache 03/12/2015  . COPD (chronic obstructive pulmonary disease) 01/20/2015  . 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 GERD (gastroesophageal reflux disease) 01/20/2015  . Disorder of smooth muscle 01/20/2015  . Hepatic encephalopathy 01/20/2015  . Hypothyroid   . Morbid obesity   . OSA (obstructive sleep apnea)   . RLS (restless legs syndrome)   . Bipolar depression   . Vitamin D deficiency   . Urinary incontinence   . Liver lesion   . Acute renal failure   . Hypomagnesemia   .  Dyspnea 10/20/2013  . Obstructive sleep apnea 10/20/2013  . PERIPHERAL NEUROPATHY 03/04/2009  . HEMORRHOIDS, EXTERNAL 09/03/2008  . MORBID OBESITY 10/14/2007  . ALLERGIC RHINITIS 07/04/2007  . ASTHMA, PERSISTENT, MODERATE 07/04/2007  . INTERSTITIAL CYSTITIS 07/04/2007  . HYPERTRIGLYCERIDEMIA 07/03/2007  . FATIGUE, CHRONIC 07/03/2007  . BIPOLAR AFFECTIVE DISORDER, HX OF 07/03/2007  . DM 06/19/2007  . HYPERTENSION, BENIGN ESSENTIAL 06/19/2007    Past Surgical History  Procedure Laterality Date  . Abdominal hysterectomy    . Tubal ligation    . Liver biopsy  March 2016     Negative  . Abdominal hysterectomy      Partial  . Cholecystectomy    . Bladder surgery      x 2  . Lung biopsy      Current Outpatient Rx  Name  Route  Sig  Dispense  Refill  . Choline Fenofibrate (FENOFIBRIC ACID) 45 MG CPDR   Oral   Take 1 capsule by mouth daily.         . DULoxetine (CYMBALTA) 60 MG capsule   Oral   Take 120 mg by mouth daily.         Marland Kitchen HYDROcodone-acetaminophen (NORCO/VICODIN) 5-325 MG per tablet   Oral   Take 1 tablet by mouth every 4 (four) hours as needed for moderate pain.         Marland Kitchen insulin lispro (HUMALOG) 100 UNIT/ML injection   Subcutaneous   Inject into the skin 3 (three) times daily as needed for high blood sugar.         . insulin NPH Human (HUMULIN N,NOVOLIN N) 100 UNIT/ML injection   Subcutaneous   Inject into the skin. 75 units in the am, 50 units in at lunch, 75 units at night         . lamoTRIgine (LAMICTAL) 150 MG tablet   Oral   Take 150 mg by mouth daily.          Marland Kitchen levothyroxine (SYNTHROID, LEVOTHROID) 75 MCG tablet      TAKE 1 TABLET BY MOUTH EVERY DAY.   30 tablet   11   . magnesium oxide (MAG-OX) 400 MG tablet   Oral   Take 400 mg by mouth 2 (two) times daily.         . metoprolol tartrate (LOPRESSOR) 25 MG tablet   Oral   Take 12.5 mg by mouth 2 (two) times daily.         . Multiple Vitamin (MULTIVITAMIN) capsule   Oral   Take by mouth.         . Omega-3-Acid Eth Est, Dietary, 1 G CAPS   Oral   Take by mouth.         . pantoprazole (PROTONIX) 40 MG tablet   Oral   Take 40 mg by mouth daily.         . QUEtiapine (SEROQUEL) 25 MG tablet   Oral   Take 25 mg by mouth every morning. Take one in am, one at lunch         . QUEtiapine (SEROQUEL) 400 MG tablet   Oral   Take 800 mg by mouth at bedtime. Take 2 tabs at bedtime         . quinapril (ACCUPRIL) 5 MG tablet   Oral   Take 5 mg by mouth daily.         Marland Kitchen topiramate (TOPAMAX) 25 MG tablet   Oral   Take 1 tablet (25 mg total)  by  mouth 2 (two) times daily.   120 tablet   3   . traMADol (ULTRAM) 50 MG tablet   Oral   Take 2 tablets (100 mg total) by mouth every 6 (six) hours as needed.   30 tablet   1   . traZODone (DESYREL) 100 MG tablet   Oral   Take 100 mg by mouth at bedtime.         . ALPRAZolam (XANAX) 0.25 MG tablet   Oral   Take 0.25 mg by mouth 2 (two) times daily.         . cephALEXin (KEFLEX) 500 MG capsule   Oral   Take 1 capsule (500 mg total) by mouth 4 (four) times daily.   28 capsule   0     Allergies Morphine and related; Codeine; Codeine; Hydrocodone-acetaminophen; Mirtazapine; Morphine and related; Sulfamethoxazole-trimethoprim; and Vicodin  Family History  Problem Relation Age of Onset  . Heart disease Maternal Grandfather   . Rheum arthritis Maternal Grandfather   . Cancer Maternal Grandfather     liver  . Cancer Paternal Grandmother     lung  . Diabetes Mother   . Heart disease Mother   . Hyperlipidemia Mother   . Hypertension Mother   . Kidney disease Mother   . Mental illness Mother   . Hypothyroidism Mother   . Stroke Mother   . Osteoporosis Mother   . Glaucoma Mother   . Hypertension Father   . Asthma Daughter   . Cancer Daughter     throat    Social History Social History  Substance Use Topics  . Smoking status: Never Smoker   . Smokeless tobacco: Never Used  . Alcohol Use: No    Review of Systems  Constitutional: Negative for fever. ENT: Negative for sore throat. Cardiovascular: Negative for chest pain. Respiratory: Negative for shortness of breath. Gastrointestinal: Negative for abdominal pain, vomiting and diarrhea. Genitourinary: Negative for dysuria. Musculoskeletal: No myalgias or injuries. Skin: Negative for rash. Neurological: Negative for headaches Psychological: History of bipolar. Ingestion today. Denies suicidal ideation or thoughts of self-harm.  10-point ROS otherwise  negative.  ____________________________________________   PHYSICAL EXAM:  VITAL SIGNS: ED Triage Vitals  Enc Vitals Group     BP 05/11/15 0956 119/80 mmHg     Pulse Rate 05/11/15 0956 74     Resp 05/11/15 0956 18     Temp 05/11/15 0956 98 F (36.7 C)     Temp Source 05/11/15 0956 Oral     SpO2 05/11/15 0956 96 %     Weight 05/11/15 0956 181 lb (82.101 kg)     Height 05/11/15 0956 4' 11"  (1.499 m)     Head Cir --      Peak Flow --      Pain Score 05/11/15 0958 5     Pain Loc --      Pain Edu? --      Excl. in [place]? --     Constitutional:  Alert and oriented. Well appearing and in no distress. ENT   Head: Normocephalic and atraumatic.   Nose: No congestion/rhinnorhea.   Mouth/Throat: Mucous membranes are moist. Cardiovascular: Normal rate, regular rhythm, no murmur noted Respiratory:  Normal respiratory effort, no tachypnea.    Breath sounds are clear and equal bilaterally.  Gastrointestinal: Soft and nontender. No distention.  Back: No muscle spasm, no tenderness, no CVA tenderness. Musculoskeletal: No deformity noted. Nontender with normal range of motion in all extremities.  No noted edema.  Neurologic:  Normal speech and language. No gross focal neurologic deficits are appreciated.  Skin:  Skin is warm, dry. No rash noted. Psychiatric: Mood and affect are normal. Speech and behavior are normal. Patient is calm and pleasant. She does not appear agitated. She denies thoughts of self-harm or suicidal ideation. She explains this accidental ingestion, although we are still concerned that this may have been an impulsive act and not accidental. ____________________________________________    LABS (pertinent positives/negatives)  Labs Reviewed  URINE DRUG SCREEN, QUALITATIVE ([facility] ONLY) - Abnormal; Notable for the following:    Tricyclic, Ur Screen POSITIVE (*)    Opiate, Ur Screen POSITIVE (*)    All other components within normal limits  URINALYSIS COMPLETEWITH  MICROSCOPIC ([facility] ONLY) - Abnormal; Notable for the following:    Color, Urine YELLOW (*)    APPearance CLOUDY (*)    Leukocytes, UA 2+ (*)    Bacteria, UA FEW (*)    Squamous Epithelial / LPF 6-30 (*)    All other components within normal limits  COMPREHENSIVE METABOLIC PANEL - Abnormal; Notable for the following:    Chloride 100 (*)    Glucose, Bld 186 (*)    BUN 26 (*)    Creatinine, Ser 1.18 (*)    GFR calc non Af Amer 52 (*)    All other components within normal limits  CBC WITH DIFFERENTIAL/PLATELET - Abnormal; Notable for the following:    Hemoglobin 10.9 (*)    HCT 33.8 (*)    RDW 15.0 (*)    All other components within normal limits  ACETAMINOPHEN LEVEL - Abnormal; Notable for the following:    Acetaminophen (Tylenol), Serum <10 (*)    All other components within normal limits  ETHANOL  SALICYLATE LEVEL     ____________________________________________   EKG  ED ECG REPORT I, Mahiya Kercheval W, the attending physician, personally viewed and interpreted this ECG.   Date: 05/11/2015  EKG Time: 10:15 AM  Rate: 74  Rhythm: Normal sinus rhythm  Axis: Normal  Intervals: Normal  ST&T Change: None noted   ____________________________________________   INITIAL IMPRESSION / ASSESSMENT AND PLAN / ED COURSE  Pertinent labs & imaging results that were available during my care of the patient were reviewed by me and considered in my medical decision making (see chart for details).   Pleasant, calm, 49 year old female in no acute distress. She does not appear agitated. She has an intact thought process. She denies any suicidal ideation or thoughts of self-harm. Despite this, I still have my reservations. Your aspects of this ingestion that make me nervous for the patient's psychiatric state. We will consult psychiatry.  ----------------------------------------- 12:44 PM on 05/11/2015 -----------------------------------------  Patient has been hemodynamically stable.  She looks well and remains alert and communicative. Her blood tests are overall reasonable, though she does have a urinary tract infection with white blood cells too numerous to count.  Her Tylenol and aspirin levels are both negative.  The patient has been seen by Dr. Weber Cooks, psychiatry. He has spoken with the patient's husband as well. All are comfortable that this was an accidental ingestion and that the patient is safe and stable and was not trying to hurt herself.  We will discharge the patient and advised her to follow-up with her usual therapist and medical doctor.  ____________________________________________   FINAL CLINICAL IMPRESSION(S) / ED DIAGNOSES  Final diagnoses:  Accidental medication overdose, initial encounter  Bipolar disorder, chronic   Ahmed Prima, MD 05/11/15 [place]  Frances Nickels, MD 05/11/15 1302

## 2015-05-12 ENCOUNTER — Encounter (INDEPENDENT_AMBULATORY_CARE_PROVIDER_SITE_OTHER): Payer: Self-pay

## 2015-05-12 ENCOUNTER — Encounter: Payer: Self-pay | Admitting: Gastroenterology

## 2015-05-12 ENCOUNTER — Ambulatory Visit (INDEPENDENT_AMBULATORY_CARE_PROVIDER_SITE_OTHER): Payer: Medicare Other | Admitting: Gastroenterology

## 2015-05-12 VITALS — BP 109/61 | HR 76 | Ht 59.0 in | Wt 185.0 lb

## 2015-05-12 DIAGNOSIS — K5909 Other constipation: Secondary | ICD-10-CM

## 2015-05-12 DIAGNOSIS — K5900 Constipation, unspecified: Secondary | ICD-10-CM | POA: Diagnosis not present

## 2015-05-12 DIAGNOSIS — R748 Abnormal levels of other serum enzymes: Secondary | ICD-10-CM

## 2015-05-12 MED ORDER — LINACLOTIDE 145 MCG PO CAPS: 145.0000 ug | ORAL_CAPSULE | Freq: Every day | ORAL | Status: DC

## 2015-05-12 NOTE — Progress Notes (Signed)
Primary Care Physician: Enid Derry, MD  Primary Gastroenterologist:  Dr. [name]  Chief Complaint  Patient presents with  . Drug induced hepatitis    HPI: [name] is a 49 y.o. female here for abnormal liver enzymes. This patient reports she was in the emergency room yesterday after taking her husband's medications Biaxin. [name] labs from yesterday showed normal liver enzymes. The patient had a history of drug-induced hepatitis that appears to have resolved. [name] latest ammonia back in July was also normal as was the liver enzymes in July. The patient denies any other complaints at the present time except some constipation which she states has been helped in the past by Linzess.  Current Outpatient Prescriptions  Medication Sig Dispense Refill  . Choline Fenofibrate (FENOFIBRIC ACID) 45 MG CPDR Take 1 capsule by mouth daily.    . DULoxetine (CYMBALTA) 60 MG capsule Take 120 mg by mouth daily.    Marland Kitchen HYDROcodone-acetaminophen (NORCO/VICODIN) 5-325 MG per tablet Take 1 tablet by mouth every 4 (four) hours as needed for moderate pain.    Marland Kitchen insulin lispro (HUMALOG) 100 UNIT/ML injection Inject into the skin 3 (three) times daily as needed for high blood sugar.    . insulin NPH Human (HUMULIN N,NOVOLIN N) 100 UNIT/ML injection Inject into the skin. 75 units in the am, 50 units in at lunch, 75 units at night    . lamoTRIgine (LAMICTAL) 150 MG tablet Take 150 mg by mouth daily.     Marland Kitchen levothyroxine (SYNTHROID, LEVOTHROID) 75 MCG tablet TAKE 1 TABLET BY MOUTH EVERY DAY. 30 tablet 11  . magnesium oxide (MAG-OX) 400 MG tablet Take 400 mg by mouth 2 (two) times daily.    . metoprolol tartrate (LOPRESSOR) 25 MG tablet Take 12.5 mg by mouth 2 (two) times daily.    . pantoprazole (PROTONIX) 40 MG tablet Take 40 mg by mouth daily.    . QUEtiapine (SEROQUEL) 25 MG tablet Take 25 mg by mouth every morning. Take one in am, one at lunch    . QUEtiapine (SEROQUEL) 400 MG tablet Take  800 mg by mouth at bedtime. Take 2 tabs at bedtime    . topiramate (TOPAMAX) 25 MG tablet Take 1 tablet (25 mg total) by mouth 2 (two) times daily. 120 tablet 3  . traZODone (DESYREL) 100 MG tablet Take 100 mg by mouth at bedtime.    . Linaclotide (LINZESS) 145 MCG CAPS capsule Take 1 capsule (145 mcg total) by mouth daily. 30 capsule 6  . Multiple Vitamin (MULTIVITAMIN) capsule Take by mouth.    . Omega-3-Acid Eth Est, Dietary, 1 G CAPS Take by mouth.    . quinapril (ACCUPRIL) 5 MG tablet Take 5 mg by mouth daily.    . traMADol (ULTRAM) 50 MG tablet Take 2 tablets (100 mg total) by mouth every 6 (six) hours as needed. (Patient not taking: Reported on 05/12/2015) 30 tablet 1   No current facility-administered medications for this visit.    Allergies as of 05/12/2015 - Review Complete 05/12/2015  Allergen Reaction Noted  . Morphine and related  10/20/2013  . Codeine    . Codeine  10/20/2013  . Hydrocodone-acetaminophen    . Mirtazapine    . Morphine and related  01/20/2015  . Sulfamethoxazole-trimethoprim  06/09/2008  . Vicodin [hydrocodone-acetaminophen]  10/20/2013    ROS:  General: Negative for anorexia, weight loss, fever, chills, fatigue, weakness. ENT: Negative for hoarseness, difficulty swallowing , nasal congestion. CV: Negative for chest pain,  angina, palpitations, dyspnea on exertion, peripheral edema.  Respiratory: Negative for dyspnea at rest, dyspnea on exertion, cough, sputum, wheezing.  GI: See history of present illness. GU:  Negative for dysuria, hematuria, urinary incontinence, urinary frequency, nocturnal urination.  Endo: Negative for unusual weight change.    Physical Examination:   BP 109/61 mmHg  Pulse 76  Ht 4' 11"  (1.499 m)  Wt 185 lb (83.915 kg)  BMI 37.35 kg/m2  General: Well-nourished, well-developed in no acute distress.  Eyes: No icterus. Conjunctivae pink. Mouth: Oropharyngeal mucosa moist and pink , no lesions erythema or exudate. Lungs: Clear  to auscultation bilaterally. Non-labored. Heart: Regular rate and rhythm, no murmurs rubs or gallops.  Abdomen: Bowel sounds are normal, nontender, nondistended, no hepatosplenomegaly or masses, no abdominal bruits or hernia , no rebound or guarding.   Extremities: No lower extremity edema. No clubbing or deformities. Neuro: Alert and oriented x 3.  Grossly intact. Skin: Warm and dry, no jaundice.   Psych: Alert and cooperative, normal mood and affect.  Labs:    Imaging Studies: No results found.  Assessment and Plan:   [name] is a 49 y.o. y/o female who comes in today with a history of abnormal liver enzymes. [name] last 2 liver enzymes back in July and last night were both normal. The patient did have a liver biopsy that showed some fatty liver. The patient has been told to keep her diabetes under control and lose weight. She has also been given a prescription for Linzess for her constipation. She states it has been helping her in the past. The patient has been explained the plan and will contact me if she has any further symptoms or problems.   Note: This dictation was prepared with Dragon dictation along with smaller phrase technology. Any transcriptional errors that result from this process are unintentional.

## 2015-05-13 ENCOUNTER — Encounter: Payer: Self-pay | Admitting: Family Medicine

## 2015-05-13 ENCOUNTER — Ambulatory Visit (INDEPENDENT_AMBULATORY_CARE_PROVIDER_SITE_OTHER): Payer: Medicare Other | Admitting: Family Medicine

## 2015-05-13 VITALS — BP 97/58 | HR 77 | Temp 98.8°F | Ht 59.2 in | Wt 183.0 lb

## 2015-05-13 DIAGNOSIS — I952 Hypotension due to drugs: Secondary | ICD-10-CM

## 2015-05-13 DIAGNOSIS — Z5181 Encounter for therapeutic drug level monitoring: Secondary | ICD-10-CM

## 2015-05-13 DIAGNOSIS — I959 Hypotension, unspecified: Secondary | ICD-10-CM | POA: Insufficient documentation

## 2015-05-13 DIAGNOSIS — N3000 Acute cystitis without hematuria: Secondary | ICD-10-CM | POA: Diagnosis not present

## 2015-05-13 DIAGNOSIS — T50901D Poisoning by unspecified drugs, medicaments and biological substances, accidental (unintentional), subsequent encounter: Secondary | ICD-10-CM

## 2015-05-13 DIAGNOSIS — Z79899 Other long term (current) drug therapy: Secondary | ICD-10-CM

## 2015-05-13 DIAGNOSIS — Z23 Encounter for immunization: Secondary | ICD-10-CM

## 2015-05-13 DIAGNOSIS — N183 Chronic kidney disease, stage 3 unspecified: Secondary | ICD-10-CM

## 2015-05-13 NOTE — Assessment & Plan Note (Addendum)
Evaluated by nephrologist; last GFR reviewed, as long as creatinine; avoid NSAIDs; stay hydrated; she is hypotensive today, so I'll stop the ACE-I since we need to continue the beta-blocker for cardiac reasons; if pressures go back up, then we'll restart the ACE-I for renal protection

## 2015-05-13 NOTE — Assessment & Plan Note (Signed)
Offered and given today by CMA, T. Reel; will take about two weeks to develop protective antibodies, I explained

## 2015-05-13 NOTE — Assessment & Plan Note (Signed)
Explained to patient that she had TCA present in her urine in the emergency department; TCA overdose can be fatal; she does not think her husband takes any and she herself denies taking any on purpose; I encouraged her to talk with her pharmacist to find out why she might have had TCA present in her urine (mix-up of meds in her bubble pack, does her husband take any, etc.)

## 2015-05-13 NOTE — Assessment & Plan Note (Signed)
Stop the ACE-I for now; patient to monitor her blood pressure and call if systolic BP >732 mmHg

## 2015-05-13 NOTE — Assessment & Plan Note (Signed)
Patient sounds to have truly taken her husband's medicine accidentally; no red flags today here at my appointment with her; she was evaluated in ER yesterday; she has psychiatrist

## 2015-05-13 NOTE — Progress Notes (Signed)
BP 97/58 mmHg  Pulse 77  Temp(Src) 98.8 F (37.1 C)  Ht 4' 11.2" (1.504 m)  Wt 183 lb (83.008 kg)  BMI 36.70 kg/m2  SpO2 97%   Subjective:    Patient ID: [name], female    DOB: Mar 13, 1966, 49 y.o.   MRN: [id number]  HPI: [name] is a 49 y.o. female  No chief complaint on file. She says she isn't exactly sure why she's here; she was in the ER yesterday; accidentally took her husband's pills; his bottle was on the dresser and she grabbed the bottle and took him and drank them down, then laid back down; she got back up, she realized after an hour when she got back up that she hadn't taken her own pills and accidentally took her husband's zoloft; they gave her charcoal and they watched her for six hours  She went to the liver doctor yesterday; her stomach is fine and her liver is fine  She went to the kidney doctor and they said there was nothing wrong with kidneys; she does have a urinary tract infection; she was given a prescription for Keflex but she held on to it waiting until she saw me; no burning with urination; no pressure or pain down below; has some back pain but has a pinched nerve in her back  Has diabetic neuropathy in legs and feet; taking topamax for that, sees neurologist in [place]; working on weight loss; sugars under 200; this morning's FSBS was 143  Her blood pressure today is lower than normal  Dr. Tamala Julian is going to give her another cortisone shot in her left shoulder; trouble raising the left arm up; painful; cannot lay on that arm; going on for a year; may need surgery; I reviewed her urine tox screen with her; she takes tylenol with codeine prescribed by orthopaedist Dr. Tamala Julian; I noted hydrocodone in the med list and she thinks that is what it is instead; I also pointed out that she had TCA in her system, she does not take any, not sure if husband took it and that was in the pills he took; she will check  Sore throat; she saw Dr. Richardson Landry (ENT) for  that  Relevant past medical, surgical, family and social history reviewed and updated as indicated. Interim medical history since our last visit reviewed. Allergies and medications reviewed and updated.  Review of Systems  Constitutional: Negative for fever.  HENT: Positive for sore throat (seeing ENT for that).   Eyes: Negative for visual disturbance.  Respiratory: Negative for cough.   Cardiovascular: Negative for palpitations.  Gastrointestinal: Positive for abdominal pain (just when constipated; started back on Linzess per GI doctor; better now on the medicine).  Endocrine: Positive for polydipsia.  Genitourinary: Negative for dysuria.       Positive for foul urine odor and a little leaking; has bladder stimulator; sees urologist  Skin: Negative for rash.  Allergic/Immunologic: Negative for food allergies.  Neurological: Negative for tremors.  Hematological: Does not bruise/bleed easily.  Psychiatric/Behavioral: Positive for confusion (nothing new, "because my bipolar").    Per HPI unless specifically indicated above     Objective:    BP 97/58 mmHg  Pulse 77  Temp(Src) 98.8 F (37.1 C)  Ht 4' 11.2" (1.504 m)  Wt 183 lb (83.008 kg)  BMI 36.70 kg/m2  SpO2 97%  Wt Readings from Last 3 Encounters:  05/13/15 183 lb (83.008 kg)  05/12/15 185 lb (83.915 kg)  05/11/15 181 lb (  82.101 kg)    Physical Exam  Constitutional: She appears well-developed and well-nourished. No distress.  obese  HENT:  Head: Normocephalic and atraumatic.  Eyes: EOM are normal. No scleral icterus.  Neck: No thyromegaly present.  Cardiovascular: Normal rate, regular rhythm and normal heart sounds.   No murmur heard. Pulmonary/Chest: Effort normal and breath sounds normal. No respiratory distress. She has no wheezes.  Abdominal: Soft. Bowel sounds are normal. She exhibits no distension.  Musculoskeletal: Normal range of motion. She exhibits no edema.  Neurological: She is alert. She exhibits  normal muscle tone.  Skin: Skin is warm and dry. She is not diaphoretic. No pallor.  Psychiatric: She has a normal mood and affect. Her behavior is normal. Judgment and thought content normal.   Diabetic Foot Form - Detailed   Diabetic Foot Exam - detailed  Diabetic Foot exam was performed with the following findings:  Yes 05/13/2015 10:49 AM  Visual Foot Exam completed.:  Yes  Is there a history of foot ulcer?:  No  Are the shoes appropriate in style and fit?:  No (Comment: flip flops)  Is there swelling or and abnormal foot shape?:  No  Are the toenails long?:  No  Are the toenails thick?:  No  Is there a claw toe deformity?:  No  Is there elevated skin temparature?:  No  Are the toenails ingrown?:  No  Normal Range of Motion:  Yes    Pulse Foot Exam completed.:  Yes  Right Dorsalis Pedis:  Present Left Dorsalis Pedis:  Present  Sensory Foot Exam Completed.:  Yes  Swelling:  No  Semmes-Weinstein Monofilament Test  R Site 1-Great Toe:  Pos L Site 1-Great Toe:  Pos  R Site 4:  Pos L Site 4:  Pos  R Site 5:  Pos L Site 5:  Pos        Results for orders placed or performed during the hospital encounter of 05/11/15  Urine Drug Screen, Qualitative  Result Value Ref Range   Tricyclic, Ur Screen POSITIVE (A) NONE DETECTED   Amphetamines, Ur Screen NONE DETECTED NONE DETECTED   MDMA (Ecstasy)Ur Screen NONE DETECTED NONE DETECTED   Cocaine Metabolite,Ur [place] NONE DETECTED NONE DETECTED   Opiate, Ur Screen POSITIVE (A) NONE DETECTED   Phencyclidine (PCP) Ur S NONE DETECTED NONE DETECTED   Cannabinoid 50 Ng, Ur [place] NONE DETECTED NONE DETECTED   Barbiturates, Ur Screen NONE DETECTED NONE DETECTED   Benzodiazepine, Ur Scrn NONE DETECTED NONE DETECTED   Methadone Scn, Ur NONE DETECTED NONE DETECTED  Urinalysis complete, with microscopic  Result Value Ref Range   Color, Urine YELLOW (A) YELLOW   APPearance CLOUDY (A) CLEAR   Glucose, UA NEGATIVE NEGATIVE mg/dL   Bilirubin Urine NEGATIVE  NEGATIVE   Ketones, ur NEGATIVE NEGATIVE mg/dL   Specific Gravity, Urine 1.023 1.005 - 1.030   Hgb urine dipstick NEGATIVE NEGATIVE   pH 5.0 5.0 - 8.0   Protein, ur NEGATIVE NEGATIVE mg/dL   Nitrite NEGATIVE NEGATIVE   Leukocytes, UA 2+ (A) NEGATIVE   RBC / HPF 0-5 0 - 5 RBC/hpf   WBC, UA TOO NUMEROUS TO COUNT 0 - 5 WBC/hpf   Bacteria, UA FEW (A) NONE SEEN   Squamous Epithelial / LPF 6-30 (A) NONE SEEN   Mucous PRESENT    Hyaline Casts, UA PRESENT   Comprehensive metabolic panel  Result Value Ref Range   Sodium 135 135 - 145 mmol/L   Potassium 3.7 3.5 - 5.1  mmol/L   Chloride 100 (L) 101 - 111 mmol/L   CO2 25 22 - 32 mmol/L   Glucose, Bld 186 (H) 65 - 99 mg/dL   BUN 26 (H) 6 - 20 mg/dL   Creatinine, Ser 1.18 (H) 0.44 - 1.00 mg/dL   Calcium 9.5 8.9 - 10.3 mg/dL   Total Protein 7.1 6.5 - 8.1 g/dL   Albumin 4.1 3.5 - 5.0 g/dL   AST 33 15 - 41 U/L   ALT 34 14 - 54 U/L   Alkaline Phosphatase 67 38 - 126 U/L   Total Bilirubin 0.4 0.3 - 1.2 mg/dL   GFR calc non Af Amer 52 (L) >60 mL/min   GFR calc Af Amer >60 >60 mL/min   Anion gap 10 5 - 15  Ethanol  Result Value Ref Range   Alcohol, Ethyl (B) <5 <5 mg/dL  CBC with Differential  Result Value Ref Range   WBC 6.5 3.6 - 11.0 K/uL   RBC 3.90 3.80 - 5.20 MIL/uL   Hemoglobin 10.9 (L) 12.0 - 16.0 g/dL   HCT 33.8 (L) 35.0 - 47.0 %   MCV 86.7 80.0 - 100.0 fL   MCH 28.0 26.0 - 34.0 pg   MCHC 32.4 32.0 - 36.0 g/dL   RDW 15.0 (H) 11.5 - 14.5 %   Platelets 331 150 - 440 K/uL   Neutrophils Relative % 53 %   Lymphocytes Relative 37 %   Monocytes Relative 8 %   Eosinophils Relative 1 %   Basophils Relative 1 %   Neutro Abs 3.4 1.4 - 6.5 K/uL   Lymphs Abs 2.4 1.0 - 3.6 K/uL   Monocytes Absolute 0.5 0.2 - 0.9 K/uL   Eosinophils Absolute 0.1 0 - 0.7 K/uL   Basophils Absolute 0.1 0 - 0.1 K/uL   Smear Review MORPHOLOGY UNREMARKABLE   Acetaminophen level  Result Value Ref Range   Acetaminophen (Tylenol), Serum <10 (L) 10 - 30 ug/mL   Salicylate level  Result Value Ref Range   Salicylate Lvl <1.4 2.8 - 30.0 mg/dL      Assessment & Plan:   Problem List Items Addressed This Visit      Cardiovascular and Mediastinum   Hypotension    Stop the ACE-I for now; patient to monitor her blood pressure and call if systolic BP >431 mmHg        Genitourinary   Chronic kidney disease, stage III (moderate)    Evaluated by nephrologist; last GFR reviewed, as long as creatinine; avoid NSAIDs; stay hydrated; she is hypotensive today, so I'll stop the ACE-I since we need to continue the beta-blocker for cardiac reasons; if pressures go back up, then we'll restart the ACE-I for renal protection      Urinary tract infection - Primary    Diagnosed in the ER yesterday; patient will start taking antibiotic; hydrate        Other   Accidental medication overdose    Patient sounds to have truly taken her husband's medicine accidentally; no red flags today here at my appointment with her; she was evaluated in ER yesterday; she has psychiatrist      Encounter for monitoring tricyclic antidepressant therapy    Explained to patient that she had TCA present in her urine in the emergency department; TCA overdose can be fatal; she does not think her husband takes any and she herself denies taking any on purpose; I encouraged her to talk with her pharmacist to find out why she might have  had TCA present in her urine (mix-up of meds in her bubble pack, does her husband take any, etc.)      Flu vaccine need    Offered and given today by CMA, T. Reel; will take about two weeks to develop protective antibodies, I explained       Other Visit Diagnoses    Immunization due           Follow up plan: Return in about 4 months (around 09/12/2015) for routine follow-up.  An after-visit summary was printed and given to the patient at [facility].  Please see the patient instructions which may contain other information and recommendations beyond what  is mentioned above in the assessment and plan.  Medications Discontinued During This Encounter  Medication Reason  . Omega-3-Acid Eth Est, Dietary, 1 G CAPS Error  . traZODone (DESYREL) 100 MG tablet Error  . quinapril (ACCUPRIL) 5 MG tablet Discontinued by provider  . traMADol (ULTRAM) 50 MG tablet Stop Taking at Discharge

## 2015-05-13 NOTE — Assessment & Plan Note (Signed)
Diagnosed in the ER yesterday; patient will start taking antibiotic; hydrate

## 2015-05-13 NOTE — Patient Instructions (Addendum)
STOP the accupril (blood pressure pill) Monitor blood pressure at home and let me know if trending up over 130 Go ahead and fill the Keflex and take as directed Stay hydrated Check with your pharmacist to find out how the tricyclics got in your urine; somehow, there must have been some in either your bubble pack or his medicines; people can overdose (fatally) on those Return in 3-4 months for follow-up Flu shot given today, will take about two weeks to form antibodies

## 2015-05-17 ENCOUNTER — Other Ambulatory Visit: Payer: Self-pay | Admitting: Family Medicine

## 2015-05-18 ENCOUNTER — Other Ambulatory Visit: Payer: Self-pay | Admitting: Gastroenterology

## 2015-05-18 IMAGING — CR DG CHEST 1V PORT
1 series · 1 of 1 positions shown · non-contrast
Comparison: PA and lateral chest 09/21/2013 and 11/06/2012.

CLINICAL DATA: Chest pain, cough and shortness of breath.

EXAM:
PORTABLE CHEST - 1 VIEW

[ap]
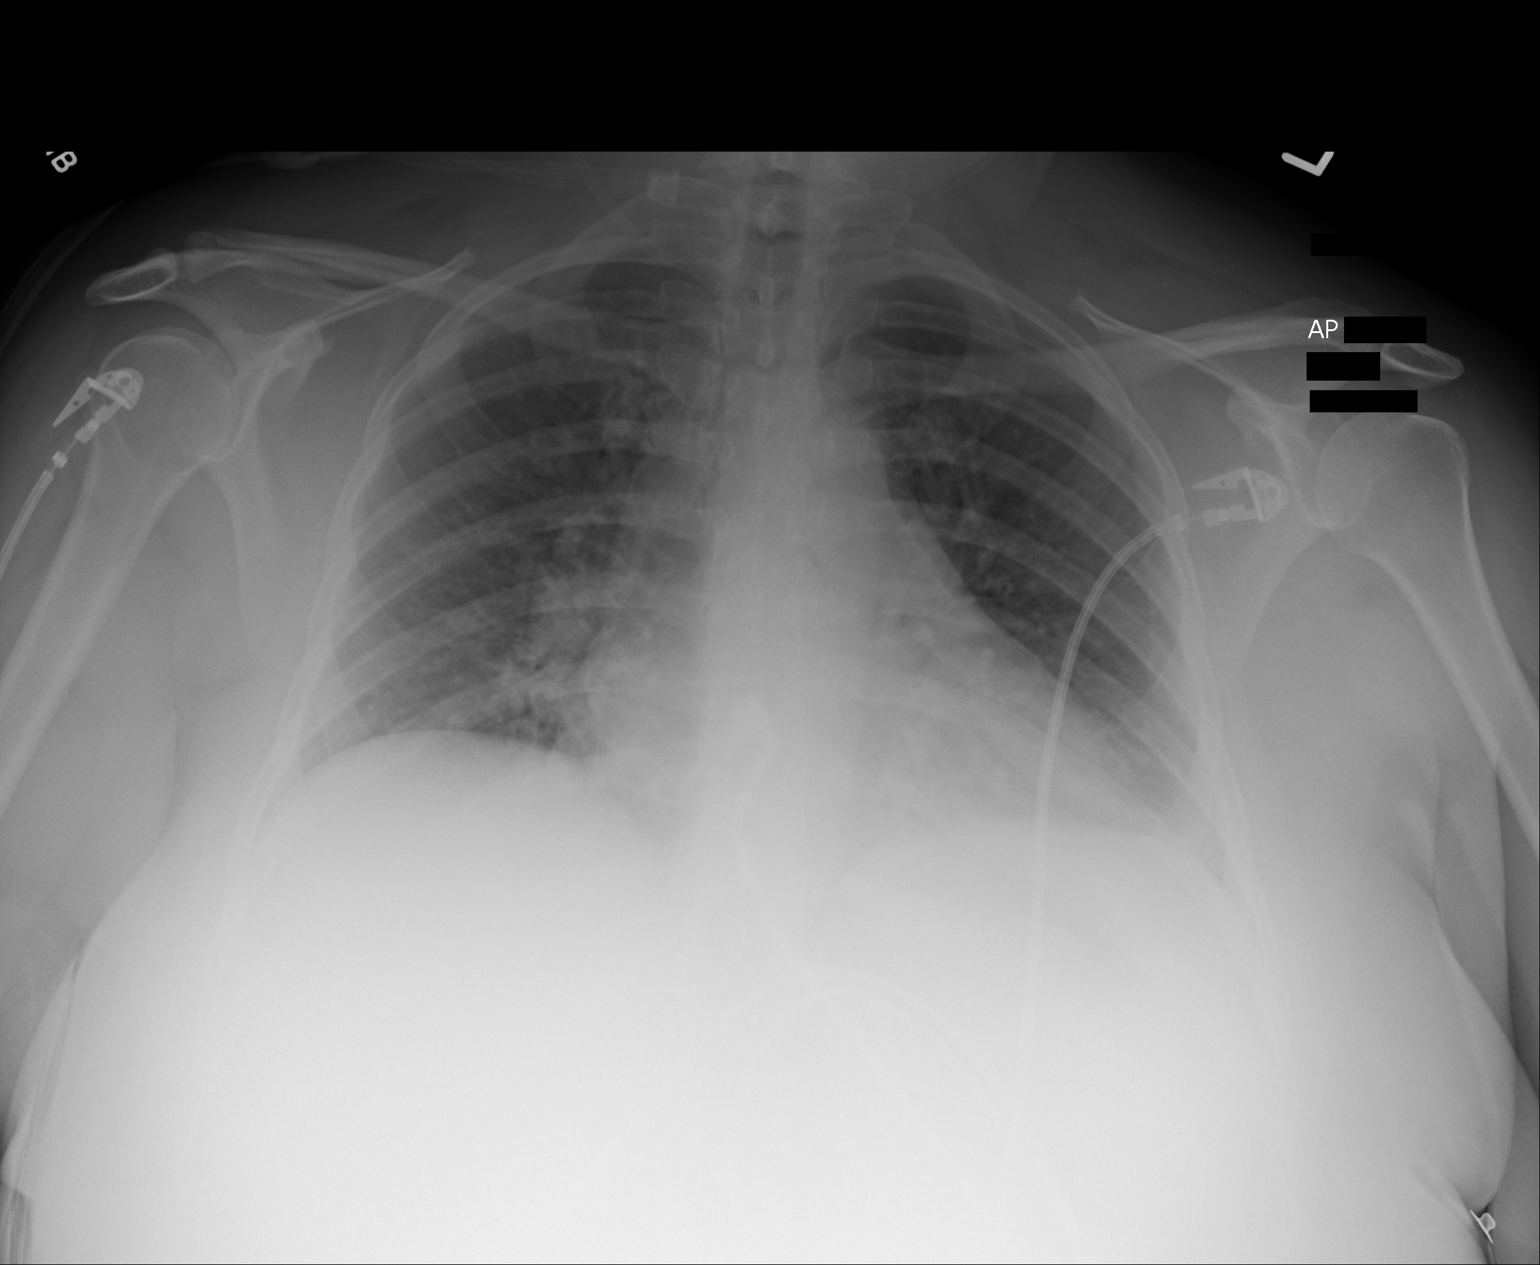

[1 of 1 positions shown; findings below may reference images not displayed]

FINDINGS: The lungs are clear. Heart size is normal. No pneumothorax or
pleural effusion.
IMPRESSION: No acute disease.

## 2015-05-18 MED ORDER — PANTOPRAZOLE SODIUM 40 MG PO TBEC: 40.0000 mg | DELAYED_RELEASE_TABLET | Freq: Every day | ORAL | Status: DC

## 2015-05-18 NOTE — Telephone Encounter (Signed)
We received a refill request for patient's Pantoprazole 40 MG 1 tab daily. I will send her refill to her pharmacy.

## 2015-05-19 ENCOUNTER — Ambulatory Visit: Payer: Medicare Other | Admitting: Neurology

## 2015-05-19 ENCOUNTER — Telehealth: Payer: Self-pay

## 2015-05-19 ENCOUNTER — Telehealth: Payer: Self-pay | Admitting: Family Medicine

## 2015-05-19 NOTE — Telephone Encounter (Signed)
Spoke to pt and informed her that per Dr. Leonie Man, pt's nerve conduction did not show any definite evidence of a pinched nerve in the back. Pt verbalized understanding. I encouraged pt to call back with any further concerns.

## 2015-05-19 NOTE — Telephone Encounter (Signed)
JB from [facility] called stated she has questions about Quinapril. Stated pt has been on the medication for over a year but received a fax saying that the medication was denied stating it was inappropriate and the pharmacy needs clarification. Pharmacy bubble packs pt's meds. Thanks.

## 2015-05-19 NOTE — Telephone Encounter (Signed)
Confirmed with pharmacist that [name] did stop patient from her Quinapril at last OV

## 2015-05-19 NOTE — Telephone Encounter (Signed)
-----   Message from Garvin Fila, MD sent at 05/18/2015  1:20 PM EDT ----- [name] inform the patient that no conduction study did not show definite evidence of pinched nerve in the back

## 2015-05-21 DIAGNOSIS — R0902 Hypoxemia: Secondary | ICD-10-CM | POA: Diagnosis not present

## 2015-05-21 DIAGNOSIS — G4733 Obstructive sleep apnea (adult) (pediatric): Secondary | ICD-10-CM | POA: Diagnosis not present

## 2015-05-21 DIAGNOSIS — J449 Chronic obstructive pulmonary disease, unspecified: Secondary | ICD-10-CM | POA: Diagnosis not present

## 2015-05-28 IMAGING — CR DG CHEST 1V PORT
1 series · 1 of 1 positions shown · non-contrast
Comparison: October 14, 2013

CLINICAL DATA: Chest pain

EXAM:
PORTABLE CHEST - 1 VIEW

[ap]
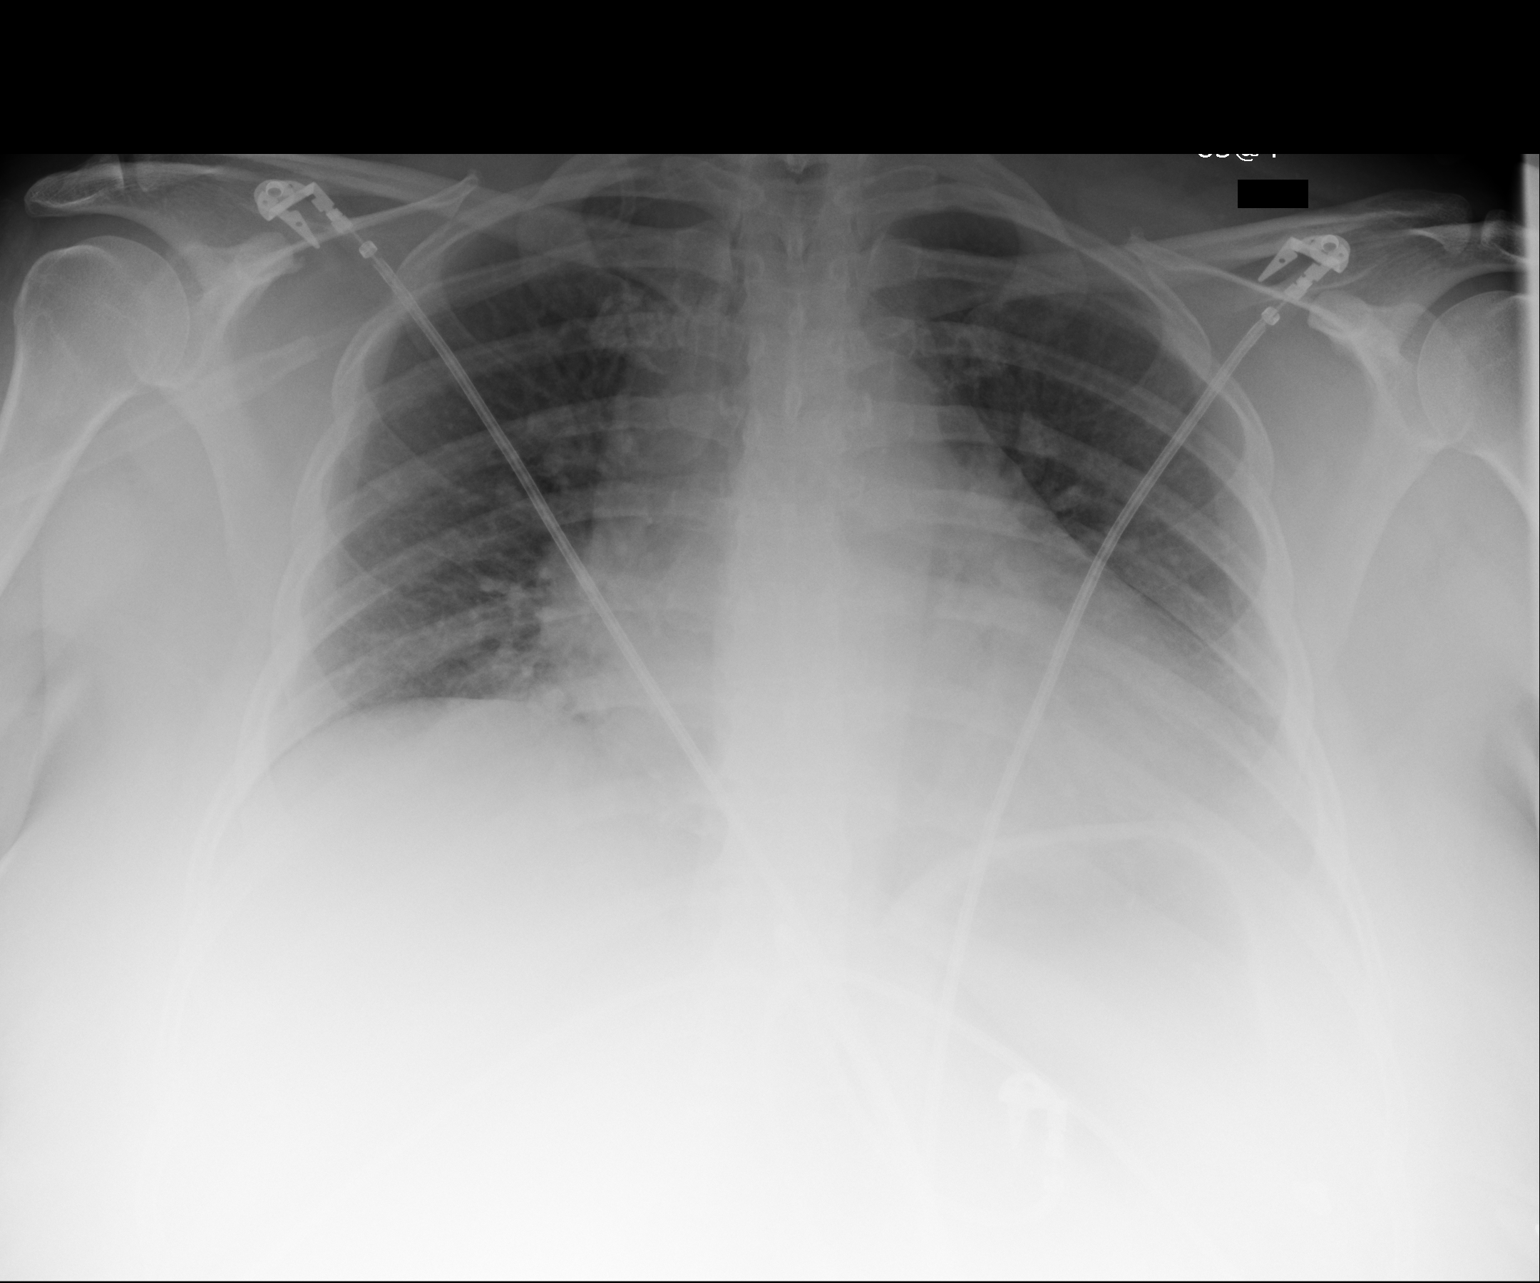

[1 of 1 positions shown; findings below may reference images not displayed]

FINDINGS: Lungs are clear. The heart is slightly enlarged with normal
pulmonary vascularity. No adenopathy. No pneumothorax. No bone
lesions.
IMPRESSION: Heart slightly enlarged.  No edema or consolidation.

## 2015-05-31 ENCOUNTER — Telehealth: Payer: Self-pay

## 2015-05-31 MED ORDER — FLUCONAZOLE 150 MG PO TABS: 150.0000 mg | ORAL_TABLET | Freq: Once | ORAL | Status: DC

## 2015-05-31 NOTE — Telephone Encounter (Signed)
She thinks she has a vaginal yeast infection from taking Keflex.

## 2015-05-31 NOTE — Telephone Encounter (Signed)
Pt called stated she needs a call back, pt stated she has a yeast infection, OTC meds not working wants to know if something can be sent to the pharmacy. Pharm is [facility]. Thanks.

## 2015-05-31 NOTE — Telephone Encounter (Signed)
I sent in Rx for fluconazole; thank you

## 2015-06-20 DIAGNOSIS — G4733 Obstructive sleep apnea (adult) (pediatric): Secondary | ICD-10-CM | POA: Diagnosis not present

## 2015-06-20 DIAGNOSIS — J449 Chronic obstructive pulmonary disease, unspecified: Secondary | ICD-10-CM | POA: Diagnosis not present

## 2015-06-20 DIAGNOSIS — R0902 Hypoxemia: Secondary | ICD-10-CM | POA: Diagnosis not present

## 2015-07-13 ENCOUNTER — Telehealth: Payer: Self-pay

## 2015-07-13 MED ORDER — CIPROFLOXACIN HCL 250 MG PO TABS: 250.0000 mg | ORAL_TABLET | Freq: Two times a day (BID) | ORAL | Status: DC

## 2015-07-13 NOTE — Telephone Encounter (Signed)
[name] patient Patient calling with complaints of a UTI. Lower abd pain, urinary frequency, burning, foul odor. Wants antibiotic called in. I offered her an appt to be seen but she refused.

## 2015-07-21 DIAGNOSIS — R0902 Hypoxemia: Secondary | ICD-10-CM | POA: Diagnosis not present

## 2015-07-21 DIAGNOSIS — J449 Chronic obstructive pulmonary disease, unspecified: Secondary | ICD-10-CM | POA: Diagnosis not present

## 2015-07-21 DIAGNOSIS — G4733 Obstructive sleep apnea (adult) (pediatric): Secondary | ICD-10-CM | POA: Diagnosis not present

## 2015-08-03 ENCOUNTER — Telehealth: Payer: Self-pay | Admitting: Family Medicine

## 2015-08-03 MED ORDER — FLUTICASONE PROPIONATE 50 MCG/ACT NA SUSP: 2.0000 | Freq: Every day | NASAL | Status: DC

## 2015-08-03 NOTE — Telephone Encounter (Signed)
Routing to provider for advice.

## 2015-08-03 NOTE — Telephone Encounter (Signed)
Patient needs to talk to you about her sinus infection and see what [name] recommands her to take over the counter.

## 2015-08-03 NOTE — Telephone Encounter (Signed)
Nasal saline rinses or neti pot will help rinse out the nasal passages She can use plain tylenol per package directions if she is having pressure She can use nasal corticsteroid and I just sent in Rx for that She can schedule an appointment if developing fever or symptoms persistent after 10 days

## 2015-08-03 NOTE — Telephone Encounter (Signed)
Patient notified and had no questions.

## 2015-08-09 ENCOUNTER — Encounter: Payer: Self-pay | Admitting: Emergency Medicine

## 2015-08-09 ENCOUNTER — Emergency Department
Admission: EM | Admit: 2015-08-09 | Discharge: 2015-08-09 | Disposition: A | Discharge status: Home / Self Care | Payer: Medicare Other | Attending: Emergency Medicine | Admitting: Emergency Medicine

## 2015-08-09 DIAGNOSIS — M545 Low back pain: Secondary | ICD-10-CM | POA: Diagnosis present

## 2015-08-09 DIAGNOSIS — I129 Hypertensive chronic kidney disease with stage 1 through stage 4 chronic kidney disease, or unspecified chronic kidney disease: Secondary | ICD-10-CM | POA: Insufficient documentation

## 2015-08-09 DIAGNOSIS — E1140 Type 2 diabetes mellitus with diabetic neuropathy, unspecified: Secondary | ICD-10-CM | POA: Diagnosis not present

## 2015-08-09 DIAGNOSIS — Z7951 Long term (current) use of inhaled steroids: Secondary | ICD-10-CM | POA: Insufficient documentation

## 2015-08-09 DIAGNOSIS — Z794 Long term (current) use of insulin: Secondary | ICD-10-CM | POA: Diagnosis not present

## 2015-08-09 DIAGNOSIS — N183 Chronic kidney disease, stage 3 (moderate): Secondary | ICD-10-CM | POA: Diagnosis not present

## 2015-08-09 DIAGNOSIS — Z792 Long term (current) use of antibiotics: Secondary | ICD-10-CM | POA: Insufficient documentation

## 2015-08-09 DIAGNOSIS — M549 Dorsalgia, unspecified: Secondary | ICD-10-CM | POA: Diagnosis not present

## 2015-08-09 DIAGNOSIS — M5442 Lumbago with sciatica, left side: Secondary | ICD-10-CM

## 2015-08-09 DIAGNOSIS — Z79899 Other long term (current) drug therapy: Secondary | ICD-10-CM | POA: Insufficient documentation

## 2015-08-09 MED ORDER — HYDROCODONE-ACETAMINOPHEN 5-325 MG PO TABS: 1.0000 | ORAL_TABLET | ORAL | Status: DC | PRN

## 2015-08-09 MED ORDER — HYDROCODONE-ACETAMINOPHEN 5-325 MG PO TABS
1.0000 | ORAL_TABLET | Freq: Once | ORAL | Status: AC
  Administered 2015-08-09: 1 via ORAL
  Filled 2015-08-09: qty 1

## 2015-08-09 MED ORDER — DIAZEPAM 2 MG PO TABS
2.0000 mg | ORAL_TABLET | Freq: Once | ORAL | Status: AC
  Administered 2015-08-09: 2 mg via ORAL
  Filled 2015-08-09: qty 1

## 2015-08-09 MED ORDER — DIAZEPAM 2 MG PO TABS: 2.0000 mg | ORAL_TABLET | Freq: Three times a day (TID) | ORAL | Status: DC | PRN

## 2015-08-09 NOTE — ED Notes (Signed)
Low back pain radiating to L leg, history of same, no new injury

## 2015-08-09 NOTE — Discharge Instructions (Signed)
Follow-up with your doctor in [place] for any continued problems with your back and sciatica. Elevate knees with 2 lying on your back and placed one pillow between your knees if lying on your side. You've may apply ice or heat to your back as needed for comfort measures. Take Norco only as directed along with diazepam as needed for muscle spasms.

## 2015-08-09 NOTE — ED Notes (Signed)
Per pt, she is not allergic to hydrocone/norco/vicodin. [name] it does not make her itch or give her a rash.

## 2015-08-09 NOTE — ED Notes (Signed)
C/o left low back paom

## 2015-08-09 NOTE — ED Provider Notes (Signed)
[facility] Emergency Department Provider Note  ____________________________________________  Time seen: Approximately 1:12 PM  I have reviewed the triage vital signs and the nursing notes.   HISTORY  Chief Complaint Back Pain  HPI [name] is a 49 y.o. female is here with complaint of low back pain with radiation to her left leg. Patient states this happened this morning. Patient is aware that she has problems with her back but has never had radiation into her left leg. She has taken medication over-the-counter at home without any relief. She denies any urinary or bowel incontinence. She denies any urinary symptoms. Patient states that her back doctor is in [place]. Currently she rates her pain a 10 out of 10. Movement increases her pain and lying on her right side and decreased it somewhat but does not make it completely go away.   Past Medical History  Diagnosis Date  . High blood pressure   . Heart attack ([place])   . Irregular heart rhythm   . Seasonal allergies   . Chronic headaches   . Sleep apnea   . Hyperlipidemia   . COPD (chronic obstructive pulmonary disease) ([place])   . Asthma   . Hypothyroid   . Morbid obesity ([place])   . OSA (obstructive sleep apnea)   . RLS (restless legs syndrome)   . GERD (gastroesophageal reflux disease)   . Bipolar depression ([place])   . Vitamin D deficiency   . Urinary incontinence   . Hypoxemia   . Liver lesion     Favored to be benign on CT; MRI of abd w/contract in Aug 2016. Liver Biopsy-Negative, March 2016  . Acute renal failure ([place])   . Hypomagnesemia   . Hypertension   . Headache   . Diabetes mellitus without complication (HCC)     Type 2, insulin dependent. Patient also takes Metformin.  . CHF (congestive heart failure) ([place])   . Diabetic neuropathy ([place])   . Pinched nerve     Back    Patient Active Problem List   Diagnosis Date Noted  . Hypotension 05/13/2015  . Encounter for monitoring  tricyclic antidepressant therapy 05/13/2015  . Flu vaccine need 05/13/2015  . Accidental medication overdose 05/11/2015  . Bipolar disorder ([place]) 05/11/2015  . Liver disease 05/11/2015  . Diabetes ([place]) 05/11/2015  . Apnea, sleep 05/05/2015  . Heart valve disease 05/05/2015  . Urinary tract infection 03/24/2015  . Chronic kidney disease, stage III (moderate) 03/15/2015  . Anemia 03/15/2015  . Migraine 03/12/2015  . Backache 03/12/2015  . COPD (chronic obstructive pulmonary disease) ([place]) 01/20/2015  . 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 GERD (gastroesophageal reflux disease) 01/20/2015  . Disorder of smooth muscle 01/20/2015  . Hepatic encephalopathy ([place]) 01/20/2015  . Hypothyroid   . OSA (obstructive sleep apnea)   . RLS (restless legs syndrome)   . Vitamin D deficiency   . Urinary incontinence   . Liver lesion   . Acute renal failure ([place])   . Hypomagnesemia   . Dyspnea 10/20/2013  . Obstructive sleep apnea 10/20/2013  . PERIPHERAL NEUROPATHY 03/04/2009  . HEMORRHOIDS, EXTERNAL 09/03/2008  . ALLERGIC RHINITIS 07/04/2007  . ASTHMA, PERSISTENT, MODERATE 07/04/2007  . INTERSTITIAL CYSTITIS 07/04/2007  . HYPERTRIGLYCERIDEMIA 07/03/2007  . FATIGUE, CHRONIC 07/03/2007  . HYPERTENSION, BENIGN ESSENTIAL 06/19/2007    Past Surgical History  Procedure Laterality Date  . Abdominal hysterectomy    . Tubal ligation    . Liver biopsy  March 2016  Negative  . Abdominal hysterectomy      Partial  . Cholecystectomy    . Bladder surgery      x 2  . Lung biopsy      Current Outpatient Rx  Name  Route  Sig  Dispense  Refill  . Choline Fenofibrate (FENOFIBRIC ACID) 45 MG CPDR      TAKE 1 CAPSULE BY MOUTH EACH DAY   30 capsule   5   . ciprofloxacin (CIPRO) 250 MG tablet   Oral   Take 1 tablet (250 mg total) by mouth 2 (two) times daily.   14 tablet   0   . diazepam (VALIUM) 2 MG tablet   Oral   Take 1 tablet (2 mg total) by mouth every 8 (eight) hours as  needed for muscle spasms.   9 tablet   0   . DULoxetine (CYMBALTA) 60 MG capsule   Oral   Take 120 mg by mouth daily.         . fluconazole (DIFLUCAN) 150 MG tablet   Oral   Take 1 tablet (150 mg total) by mouth once. Do not take cholesterol medicine for 3 days   1 tablet   0   . fluticasone (FLONASE) 50 MCG/ACT nasal spray   Each Nare   Place 2 sprays into both nostrils daily.   16 g   2   . HYDROcodone-acetaminophen (NORCO/VICODIN) 5-325 MG tablet   Oral   Take 1 tablet by mouth every 4 (four) hours as needed for moderate pain.   20 tablet   0   . insulin lispro (HUMALOG) 100 UNIT/ML injection   Subcutaneous   Inject into the skin 3 (three) times daily as needed for high blood sugar.         . insulin NPH Human (HUMULIN N,NOVOLIN N) 100 UNIT/ML injection   Subcutaneous   Inject into the skin. 75 units in the am, 50 units in at lunch, 75 units at night         . lamoTRIgine (LAMICTAL) 150 MG tablet   Oral   Take 150 mg by mouth daily.          Marland Kitchen levothyroxine (SYNTHROID, LEVOTHROID) 75 MCG tablet      TAKE 1 TABLET BY MOUTH EVERY DAY.   30 tablet   11   . Linaclotide (LINZESS) 145 MCG CAPS capsule   Oral   Take 1 capsule (145 mcg total) by mouth daily.   30 capsule   6   . magnesium oxide (MAG-OX) 400 MG tablet   Oral   Take 400 mg by mouth 2 (two) times daily.         . metoprolol tartrate (LOPRESSOR) 25 MG tablet      TAKE 1/2 TABLET BY MOUTH TWICE A DAY   30 tablet   5   . Multiple Vitamin (MULTIVITAMIN) capsule   Oral   Take by mouth.         . pantoprazole (PROTONIX) 40 MG tablet   Oral   Take 1 tablet (40 mg total) by mouth daily.   31 tablet   12   . QUEtiapine (SEROQUEL) 25 MG tablet   Oral   Take 25 mg by mouth every morning. Take one in am, one at lunch         . QUEtiapine (SEROQUEL) 400 MG tablet   Oral   Take 800 mg by mouth at bedtime. Take 2 tabs at bedtime         .  topiramate (TOPAMAX) 25 MG tablet    Oral   Take 1 tablet (25 mg total) by mouth 2 (two) times daily.   120 tablet   3     Allergies Morphine and related; Codeine; Codeine; Hydrocodone-acetaminophen; Mirtazapine; Morphine and related; Sulfamethoxazole-trimethoprim; and Vicodin  Family History  Problem Relation Age of Onset  . Heart disease Maternal Grandfather   . Rheum arthritis Maternal Grandfather   . Cancer Maternal Grandfather     liver  . Cancer Paternal Grandmother     lung  . Diabetes Mother   . Heart disease Mother   . Hyperlipidemia Mother   . Hypertension Mother   . Kidney disease Mother   . Mental illness Mother   . Hypothyroidism Mother   . Stroke Mother   . Osteoporosis Mother   . Glaucoma Mother   . Hypertension Father   . Asthma Daughter   . Cancer Daughter     throat    Social History Social History  Substance Use Topics  . Smoking status: Never Smoker   . Smokeless tobacco: Never Used  . Alcohol Use: No    Review of Systems Constitutional: No fever/chills Eyes: No visual changes. ENT: No sore throat. Cardiovascular: Denies chest pain. Respiratory: Denies shortness of breath. Gastrointestinal: No abdominal pain.  No nausea, no vomiting.  No diarrhea.  No constipation. Genitourinary: Negative for dysuria. Musculoskeletal: Positive for back pain. Skin: Negative for rash. Neurological: Negative for headaches, focal weakness or numbness. Positive left leg paresthesias.  10-point ROS otherwise negative.  ____________________________________________   PHYSICAL EXAM:  VITAL SIGNS: ED Triage Vitals  Enc Vitals Group     BP 08/09/15 1158 108/64 mmHg     Pulse Rate 08/09/15 1158 97     Resp 08/09/15 1158 20     Temp 08/09/15 1158 98.3 F (36.8 C)     Temp Source 08/09/15 1158 Oral     SpO2 08/09/15 1158 96 %     Weight 08/09/15 1158 181 lb (82.101 kg)     Height 08/09/15 1158 4' 11"  (1.499 m)     Head Cir --      Peak Flow --      Pain Score 08/09/15 1200 10     Pain Loc  --      Pain Edu? --      Excl. in [place]? --     Constitutional: Alert and oriented. Well appearing and in no acute distress. Eyes: Conjunctivae are normal. PERRL. EOMI. Head: Atraumatic. Nose: No congestion/rhinnorhea. Neck: No stridor.   Cardiovascular: Normal rate, regular rhythm. Grossly normal heart sounds.  Good peripheral circulation. Respiratory: Normal respiratory effort.  No retractions. Lungs CTAB. Gastrointestinal: Soft and nontender. No distention. No abdominal bruits. Musculoskeletal: Examination of the back there is no gross deformity. There is moderate tenderness on palpation of the left SI joint area. Range of motion is restricted secondary to pain and no active muscle spasms are seen. Straight leg raises are 90 with moderate discomfort in the left leg. No lower extremity tenderness nor edema.  No joint effusions. Neurologic:  Normal speech and language. No gross focal neurologic deficits are appreciated. No gait instability. Reflexes are 1+ bilaterally. Skin:  Skin is warm, dry and intact. No rash noted. Psychiatric: Mood and affect are normal. Speech and behavior are normal.  ____________________________________________   LABS (all labs ordered are listed, but only abnormal results are displayed)  Labs Reviewed - No data to display  PROCEDURES  Procedure(s) performed: None  Critical Care performed: No  ____________________________________________   INITIAL IMPRESSION / ASSESSMENT AND PLAN / ED COURSE  Pertinent labs & imaging results that were available during my care of the patient were reviewed by me and considered in my medical decision making (see chart for details).  Patient was given a prescription for Norco as needed for severe pain along with diazepam as needed for muscle spasms. Patient is to follow-up with her back doctor if any continued problems. We discussed steroids which at this time will not be started secondary to patient being diabetic.  Patient was given information on sciatica and encouraged to follow up with her doctor. ____________________________________________   FINAL CLINICAL IMPRESSION(S) / ED DIAGNOSES  Final diagnoses:  Left-sided low back pain with left-sided sciatica      Johnn Hai, PA-C 08/09/15 [place], MD 08/10/15 [phone]

## 2015-08-10 ENCOUNTER — Emergency Department
Admission: EM | Admit: 2015-08-10 | Discharge: 2015-08-10 | Disposition: A | Discharge status: Home / Self Care | Payer: Medicare Other | Attending: Emergency Medicine | Admitting: Emergency Medicine

## 2015-08-10 ENCOUNTER — Encounter: Payer: Self-pay | Admitting: Emergency Medicine

## 2015-08-10 DIAGNOSIS — M5432 Sciatica, left side: Secondary | ICD-10-CM

## 2015-08-10 DIAGNOSIS — N183 Chronic kidney disease, stage 3 (moderate): Secondary | ICD-10-CM | POA: Insufficient documentation

## 2015-08-10 DIAGNOSIS — M545 Low back pain: Secondary | ICD-10-CM | POA: Diagnosis present

## 2015-08-10 DIAGNOSIS — I129 Hypertensive chronic kidney disease with stage 1 through stage 4 chronic kidney disease, or unspecified chronic kidney disease: Secondary | ICD-10-CM | POA: Insufficient documentation

## 2015-08-10 DIAGNOSIS — Z7951 Long term (current) use of inhaled steroids: Secondary | ICD-10-CM | POA: Diagnosis not present

## 2015-08-10 DIAGNOSIS — Z79899 Other long term (current) drug therapy: Secondary | ICD-10-CM | POA: Insufficient documentation

## 2015-08-10 DIAGNOSIS — M549 Dorsalgia, unspecified: Secondary | ICD-10-CM | POA: Diagnosis not present

## 2015-08-10 DIAGNOSIS — E1140 Type 2 diabetes mellitus with diabetic neuropathy, unspecified: Secondary | ICD-10-CM | POA: Diagnosis not present

## 2015-08-10 DIAGNOSIS — R300 Dysuria: Secondary | ICD-10-CM | POA: Diagnosis not present

## 2015-08-10 DIAGNOSIS — M792 Neuralgia and neuritis, unspecified: Secondary | ICD-10-CM | POA: Diagnosis not present

## 2015-08-10 DIAGNOSIS — Z792 Long term (current) use of antibiotics: Secondary | ICD-10-CM | POA: Diagnosis not present

## 2015-08-10 DIAGNOSIS — Z794 Long term (current) use of insulin: Secondary | ICD-10-CM | POA: Diagnosis not present

## 2015-08-10 MED ORDER — DIAZEPAM 5 MG/ML IJ SOLN
5.0000 mg | Freq: Once | INTRAMUSCULAR | Status: AC
  Administered 2015-08-10: 5 mg via INTRAVENOUS
  Filled 2015-08-10: qty 2

## 2015-08-10 MED ORDER — KETOROLAC TROMETHAMINE 30 MG/ML IJ SOLN
30.0000 mg | Freq: Once | INTRAMUSCULAR | Status: AC
  Administered 2015-08-10: 30 mg via INTRAVENOUS
  Filled 2015-08-10: qty 1

## 2015-08-10 MED ORDER — OXYCODONE-ACETAMINOPHEN 5-325 MG PO TABS: 1.0000 | ORAL_TABLET | ORAL | Status: DC | PRN

## 2015-08-10 MED ORDER — BACLOFEN 10 MG PO TABS
10.0000 mg | ORAL_TABLET | Freq: Three times a day (TID) | ORAL | Status: DC
Start: 2015-08-10 — End: 2015-08-18

## 2015-08-10 NOTE — ED Notes (Signed)
C/o lower back pain since yesterday am, states she was seen in the ED yesterday and was given Norco and diazepam for pain but it has not helped

## 2015-08-10 NOTE — ED Notes (Signed)
Pt presents with left lower back pain radiating down into left leg, started yesterday. Pt was seen here yesterday for same and was given valium and norco to take for pain. Pt got meds filled and took as directed with no relief.

## 2015-08-10 NOTE — Discharge Instructions (Signed)
Radicular Pain Radicular pain in either the arm or leg is usually from a bulging or herniated disk in the spine. A piece of the herniated disk may press against the nerves as the nerves exit the spine. This causes pain which is felt at the tips of the nerves down the arm or leg. Other causes of radicular pain may include:  Fractures.  Heart disease.  Cancer.  An abnormal and usually degenerative state of the nervous system or nerves (neuropathy). Diagnosis may require CT or MRI scanning to determine the primary cause.  Nerves that start at the neck (nerve roots) may cause radicular pain in the outer shoulder and arm. It can spread down to the thumb and fingers. The symptoms vary depending on which nerve root has been affected. In most cases radicular pain improves with conservative treatment. Neck problems may require physical therapy, a neck collar, or cervical traction. Treatment may take many weeks, and surgery may be considered if the symptoms do not improve.  Conservative treatment is also recommended for sciatica. Sciatica causes pain to radiate from the lower back or buttock area down the leg into the foot. Often there is a history of back problems. Most patients with sciatica are better after 2 to 4 weeks of rest and other supportive care. Short term bed rest can reduce the disk pressure considerably. Sitting, however, is not a good position since this increases the pressure on the disk. You should avoid bending, lifting, and all other activities which make the problem worse. Traction can be used in severe cases. Surgery is usually reserved for patients who do not improve within the first months of treatment. Only take over-the-counter or prescription medicines for pain, discomfort, or fever as directed by your caregiver. Narcotics and muscle relaxants may help by relieving more severe pain and spasm and by providing mild sedation. Cold or massage can give significant relief. Spinal manipulation  is not recommended. It can increase the degree of disc protrusion. Epidural steroid injections are often effective treatment for radicular pain. These injections deliver medicine to the spinal nerve in the space between the protective covering of the spinal cord and back bones (vertebrae). Your caregiver can give you more information about steroid injections. These injections are most effective when given within two weeks of the onset of pain.  You should see your caregiver for follow up care as recommended. A program for neck and back injury rehabilitation with stretching and strengthening exercises is an important part of management.  SEEK IMMEDIATE MEDICAL CARE IF:  You develop increased pain, weakness, or numbness in your arm or leg.  You develop difficulty with bladder or bowel control.  You develop abdominal pain.   This information is not intended to replace advice given to you by your health care provider. Make sure you discuss any questions you have with your health care provider.   Document Released: 09/28/2004 Document Revised: 09/11/2014 Document Reviewed: 03/17/2015 Elsevier Interactive Patient Education 2016 [employer]  Sciatica Sciatica is pain, weakness, numbness, or tingling along your sciatic nerve. The nerve starts in the lower back and runs down the back of each leg. Nerve damage or certain conditions pinch or put pressure on the sciatic nerve. This causes the pain, weakness, and other discomforts of sciatica. HOME CARE   Only take medicine as told by your doctor.  Apply ice to the affected area for 20 minutes. Do this 3-4 times a day for the first 48-72 hours. Then try heat in the same  way.  Exercise, stretch, or do your usual activities if these do not make your pain worse.  Go to physical therapy as told by your doctor.  Keep all doctor visits as told.  Do not wear high heels or shoes that are not supportive.  Get a firm mattress if your mattress is too soft  to lessen pain and discomfort. GET HELP RIGHT AWAY IF:   You cannot control when you poop (bowel movement) or pee (urinate).  You have more weakness in your lower back, lower belly (pelvis), butt (buttocks), or legs.  You have redness or puffiness (swelling) of your back.  You have a burning feeling when you pee.  You have pain that gets worse when you lie down.  You have pain that wakes you from your sleep.  Your pain is worse than past pain.  Your pain lasts longer than 4 weeks.  You are suddenly losing weight without reason. MAKE SURE YOU:   Understand these instructions.  Will watch this condition.  Will get help right away if you are not doing well or get worse.   This information is not intended to replace advice given to you by your health care provider. Make sure you discuss any questions you have with your health care provider.   Document Released: 05/30/2008 Document Revised: 05/12/2015 Document Reviewed: 12/31/2011 Elsevier Interactive Patient Education [employer].

## 2015-08-10 NOTE — ED Provider Notes (Signed)
[facility] Emergency Department Provider Note  ____________________________________________  Time seen: Approximately 4:00 PM  I have reviewed the triage vital signs and the nursing notes.   HISTORY  Chief Complaint Back Pain    HPI [name] is a 49 y.o. female who presents for evaluation of continuous left lower back pain rating down her left leg. Patient reports that a past medical history of neuropathy with a pinched nerve per her neurologist. Seen yesterday given prescription for Norco and diazepam but no relief in the last 24 hours. Presents back for the same symptoms.   Past Medical History  Diagnosis Date  . High blood pressure   . Heart attack ([place])   . Irregular heart rhythm   . Seasonal allergies   . Chronic headaches   . Sleep apnea   . Hyperlipidemia   . COPD (chronic obstructive pulmonary disease) ([place])   . Asthma   . Hypothyroid   . Morbid obesity ([place])   . OSA (obstructive sleep apnea)   . RLS (restless legs syndrome)   . GERD (gastroesophageal reflux disease)   . Bipolar depression ([place])   . Vitamin D deficiency   . Urinary incontinence   . Hypoxemia   . Liver lesion     Favored to be benign on CT; MRI of abd w/contract in Aug 2016. Liver Biopsy-Negative, March 2016  . Acute renal failure ([place])   . Hypomagnesemia   . Hypertension   . Headache   . Diabetes mellitus without complication (HCC)     Type 2, insulin dependent. Patient also takes Metformin.  . CHF (congestive heart failure) ([place])   . Diabetic neuropathy ([place])   . Pinched nerve     Back    Patient Active Problem List   Diagnosis Date Noted  . Hypotension 05/13/2015  . Encounter for monitoring tricyclic antidepressant therapy 05/13/2015  . Flu vaccine need 05/13/2015  . Accidental medication overdose 05/11/2015  . Bipolar disorder ([place]) 05/11/2015  . Liver disease 05/11/2015  . Diabetes ([place]) 05/11/2015  . Apnea, sleep 05/05/2015  . Heart valve  disease 05/05/2015  . Urinary tract infection 03/24/2015  . Chronic kidney disease, stage III (moderate) 03/15/2015  . Anemia 03/15/2015  . Migraine 03/12/2015  . Backache 03/12/2015  . COPD (chronic obstructive pulmonary disease) ([place]) 01/20/2015  . 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 GERD (gastroesophageal reflux disease) 01/20/2015  . Disorder of smooth muscle 01/20/2015  . Hepatic encephalopathy ([place]) 01/20/2015  . Hypothyroid   . OSA (obstructive sleep apnea)   . RLS (restless legs syndrome)   . Vitamin D deficiency   . Urinary incontinence   . Liver lesion   . Acute renal failure ([place])   . Hypomagnesemia   . Dyspnea 10/20/2013  . Obstructive sleep apnea 10/20/2013  . PERIPHERAL NEUROPATHY 03/04/2009  . HEMORRHOIDS, EXTERNAL 09/03/2008  . ALLERGIC RHINITIS 07/04/2007  . ASTHMA, PERSISTENT, MODERATE 07/04/2007  . INTERSTITIAL CYSTITIS 07/04/2007  . HYPERTRIGLYCERIDEMIA 07/03/2007  . FATIGUE, CHRONIC 07/03/2007  . HYPERTENSION, BENIGN ESSENTIAL 06/19/2007    Past Surgical History  Procedure Laterality Date  . Abdominal hysterectomy    . Tubal ligation    . Liver biopsy  March 2016    Negative  . Abdominal hysterectomy      Partial  . Cholecystectomy    . Bladder surgery      x 2  . Lung biopsy      Current Outpatient Rx  Name  Route  Sig  Dispense  Refill  . baclofen (LIORESAL) 10 MG tablet   Oral   Take 1 tablet (10 mg total) by mouth 3 (three) times daily.   30 tablet   0   . Choline Fenofibrate (FENOFIBRIC ACID) 45 MG CPDR      TAKE 1 CAPSULE BY MOUTH EACH DAY   30 capsule   5   . ciprofloxacin (CIPRO) 250 MG tablet   Oral   Take 1 tablet (250 mg total) by mouth 2 (two) times daily.   14 tablet   0   . DULoxetine (CYMBALTA) 60 MG capsule   Oral   Take 120 mg by mouth daily.         . fluconazole (DIFLUCAN) 150 MG tablet   Oral   Take 1 tablet (150 mg total) by mouth once. Do not take cholesterol medicine for 3 days   1  tablet   0   . fluticasone (FLONASE) 50 MCG/ACT nasal spray   Each Nare   Place 2 sprays into both nostrils daily.   16 g   2   . insulin lispro (HUMALOG) 100 UNIT/ML injection   Subcutaneous   Inject into the skin 3 (three) times daily as needed for high blood sugar.         . insulin NPH Human (HUMULIN N,NOVOLIN N) 100 UNIT/ML injection   Subcutaneous   Inject into the skin. 75 units in the am, 50 units in at lunch, 75 units at night         . lamoTRIgine (LAMICTAL) 150 MG tablet   Oral   Take 150 mg by mouth daily.          Marland Kitchen levothyroxine (SYNTHROID, LEVOTHROID) 75 MCG tablet      TAKE 1 TABLET BY MOUTH EVERY DAY.   30 tablet   11   . Linaclotide (LINZESS) 145 MCG CAPS capsule   Oral   Take 1 capsule (145 mcg total) by mouth daily.   30 capsule   6   . magnesium oxide (MAG-OX) 400 MG tablet   Oral   Take 400 mg by mouth 2 (two) times daily.         . metoprolol tartrate (LOPRESSOR) 25 MG tablet      TAKE 1/2 TABLET BY MOUTH TWICE A DAY   30 tablet   5   . Multiple Vitamin (MULTIVITAMIN) capsule   Oral   Take by mouth.         . oxyCODONE-acetaminophen (ROXICET) 5-325 MG tablet   Oral   Take 1-2 tablets by mouth every 4 (four) hours as needed for severe pain.   15 tablet   0   . pantoprazole (PROTONIX) 40 MG tablet   Oral   Take 1 tablet (40 mg total) by mouth daily.   31 tablet   12   . QUEtiapine (SEROQUEL) 25 MG tablet   Oral   Take 25 mg by mouth every morning. Take one in am, one at lunch         . QUEtiapine (SEROQUEL) 400 MG tablet   Oral   Take 800 mg by mouth at bedtime. Take 2 tabs at bedtime         . topiramate (TOPAMAX) 25 MG tablet   Oral   Take 1 tablet (25 mg total) by mouth 2 (two) times daily.   120 tablet   3     Allergies Morphine and related; Codeine; Codeine; Hydrocodone-acetaminophen; Mirtazapine; Morphine and related; Sulfamethoxazole-trimethoprim;  and Vicodin  Family History  Problem Relation Age  of Onset  . Heart disease Maternal Grandfather   . Rheum arthritis Maternal Grandfather   . Cancer Maternal Grandfather     liver  . Cancer Paternal Grandmother     lung  . Diabetes Mother   . Heart disease Mother   . Hyperlipidemia Mother   . Hypertension Mother   . Kidney disease Mother   . Mental illness Mother   . Hypothyroidism Mother   . Stroke Mother   . Osteoporosis Mother   . Glaucoma Mother   . Hypertension Father   . Asthma Daughter   . Cancer Daughter     throat    Social History Social History  Substance Use Topics  . Smoking status: Never Smoker   . Smokeless tobacco: Never Used  . Alcohol Use: No    Review of Systems Constitutional: No fever/chills Eyes: No visual changes. ENT: No sore throat. Cardiovascular: Denies chest pain. Respiratory: Denies shortness of breath. Gastrointestinal: No abdominal pain.  No nausea, no vomiting.  No diarrhea.  No constipation. Genitourinary: Negative for dysuria. Musculoskeletal: Positive for low back pain Skin: Negative for rash. Neurological: Negative for headaches, focal weakness or numbness.  10-point ROS otherwise negative.  ____________________________________________   PHYSICAL EXAM:  VITAL SIGNS: ED Triage Vitals  Enc Vitals Group     BP 08/10/15 1417 120/66 mmHg     Pulse Rate 08/10/15 1417 98     Resp 08/10/15 1417 18     Temp 08/10/15 1417 98.2 F (36.8 C)     Temp Source 08/10/15 1417 Oral     SpO2 08/10/15 1417 96 %     Weight 08/10/15 1417 181 lb (82.101 kg)     Height 08/10/15 1417 4' 11"  (1.499 m)     Head Cir --      Peak Flow --      Pain Score 08/10/15 1417 10     Pain Loc --      Pain Edu? --      Excl. in [place]? --     Constitutional: Alert and oriented. Well appearing and in no acute distress. Eyes: Conjunctivae are normal. PERRL. EOMI. Head: Atraumatic. Nose: No congestion/rhinnorhea. Mouth/Throat: Mucous membranes are moist.  Oropharynx non-erythematous. Neck: No stridor.    Cardiovascular: Normal rate, regular rhythm. Grossly normal heart sounds.  Good peripheral circulation. Respiratory: Normal respiratory effort.  No retractions. Lungs CTAB. Gastrointestinal: Soft and nontender. No distention. No abdominal bruits. No CVA tenderness. Musculoskeletal: Point tenderness to the paraspinal area in the lumbar spine with straight leg raise negative essentially at about 45. Distally neurovascularly intact. Full range of motion Neurologic:  Normal speech and language. No gross focal neurologic deficits are appreciated. No gait instability. Skin:  Skin is warm, dry and intact. No rash noted. Psychiatric: Mood and affect are normal. Speech and behavior are normal.  ____________________________________________   LABS (all labs ordered are listed, but only abnormal results are displayed)  Labs Reviewed - No data to display ____________________________________________   RADIOLOGY  Deferred at this time ____________________________________________   PROCEDURES  Procedure(s) performed: None  Critical Care performed: No  ____________________________________________   INITIAL IMPRESSION / ASSESSMENT AND PLAN / ED COURSE  Pertinent labs & imaging results that were available during my care of the patient were reviewed by me and considered in my medical decision making (see chart for details).  Diazepam 5 mg and Toradol 30 mg IV given patient with improvement in pain  symptoms. Reassurance provided Rx given for baclofen 10 mg 3 times a day and Percocet 5/325. Patient is to discontinue hydrocodone and Valium. All with orthopedics as scheduled for continued workup for her back sciatica. Follow up with her PCP or referral to orthopedics on call. ____________________________________________   FINAL CLINICAL IMPRESSION(S) / ED DIAGNOSES  Final diagnoses:  Sciatica neuralgia, left      Arlyss Repress, PA-C 08/10/15 1734  Eula Listen, MD 08/10/15  2002

## 2015-08-13 ENCOUNTER — Ambulatory Visit: Payer: Self-pay | Admitting: Family Medicine

## 2015-08-17 ENCOUNTER — Ambulatory Visit: Payer: Self-pay | Admitting: Family Medicine

## 2015-08-18 ENCOUNTER — Ambulatory Visit (INDEPENDENT_AMBULATORY_CARE_PROVIDER_SITE_OTHER): Payer: Medicare Other | Admitting: Family Medicine

## 2015-08-18 ENCOUNTER — Encounter: Payer: Self-pay | Admitting: Family Medicine

## 2015-08-18 VITALS — BP 135/77 | HR 93 | Temp 97.3°F | Wt 186.0 lb

## 2015-08-18 DIAGNOSIS — M5442 Lumbago with sciatica, left side: Secondary | ICD-10-CM | POA: Insufficient documentation

## 2015-08-18 DIAGNOSIS — Z1239 Encounter for other screening for malignant neoplasm of breast: Secondary | ICD-10-CM

## 2015-08-18 DIAGNOSIS — R350 Frequency of micturition: Secondary | ICD-10-CM | POA: Diagnosis not present

## 2015-08-18 DIAGNOSIS — R197 Diarrhea, unspecified: Secondary | ICD-10-CM | POA: Diagnosis not present

## 2015-08-18 DIAGNOSIS — L089 Local infection of the skin and subcutaneous tissue, unspecified: Secondary | ICD-10-CM | POA: Diagnosis not present

## 2015-08-18 LAB — UA/M W/RFLX CULTURE, ROUTINE
BILIRUBIN UA: NEGATIVE
Leukocytes, UA: NEGATIVE
Nitrite, UA: NEGATIVE
PH UA: 5.5 (ref 5.0–7.5)
Protein, UA: NEGATIVE
RBC UA: NEGATIVE
SPEC GRAV UA: 1.025 (ref 1.005–1.030)
UUROB: 0.2 mg/dL (ref 0.2–1.0)

## 2015-08-18 MED ORDER — MUPIROCIN CALCIUM 2 % EX CREA: 1.0000 "application " | TOPICAL_CREAM | Freq: Two times a day (BID) | CUTANEOUS | Status: DC

## 2015-08-18 NOTE — Patient Instructions (Signed)
If you develop worsening back pain or leg pain or loss of control of your bladder or bowels, then go back to the ER Call Dr. Tamala Julian and make an appointment with them right away Please do start physical therapy Stop the Baclofen Use the new cream/ointment on your ear Don't scratch the back of your neck Work on weight loss and controlling sugars We'll let you know about the stool studies

## 2015-08-18 NOTE — Progress Notes (Signed)
BP 135/77 mmHg  Pulse 93  Temp(Src) 97.3 F (36.3 C)  Wt 186 lb (84.369 kg)  SpO2 94%   Subjective:    Patient ID: [name], female    DOB: [birth year], 49 y.o.   MRN: [id number]  HPI: [name] is a 49 y.o. female  Chief Complaint  Patient presents with  . Urinary Tract Infection    she is having urine frequency, burning, smell  . Diarrhea    she states she's been having it for 4 days  . ER visit    she went to the ER on Friday and was diagnosed with Sciatica. She is still having alot of back pain.  Marland Kitchen Nose sores    right ear x 3 days  . Referral    mammogram needed   She thinks she had a kidney infection; Dr. Jeananne Rama called her in some cipro; she was still hurting after the cipro; went to the ER twice after taking the cipro; they gave her diazepam and hydrocodone; the second time she went they gave her oxycodone and baclofen; since starting the baclofen, she's had green diarrhea; really foul smell; never had C diff; has diarrhea 20-30 minutes after taking the baclofen, stooling about 3 times a day; no mucous or blood; still having diarrhea Not feeling like she is emptying her bladder completely; thinks she needs to get her box batteries checked again; her doctor moved; she does not have a urologist right now Pain in the lower back; shooting down left leg; they said it was sciatica both times they saw her in the ER   She has a rash on her right ear; saw a dermatologist and they checked it and said they "might have to cut [her] ear out" but then it went away; she woke up the other morning and it is back; she has a place on the back of the neck that itches and is changing; the place on the ear showed back up 2-3 days ago; last time "it took forever to get rid of it;" she thinks it was an antibiotic cream the last time  She has noticed that her sugars are running high, 300-400 range; she sees her endocrinologist in January  Relevant past medical, surgical, family and social  history reviewed and updated as indicated. Interim medical history since our last visit reviewed. Allergies and medications reviewed and updated.  Review of Systems Per HPI unless specifically indicated above     Objective:    BP 135/77 mmHg  Pulse 93  Temp(Src) 97.3 F (36.3 C)  Wt 186 lb (84.369 kg)  SpO2 94%  Wt Readings from Last 3 Encounters:  08/18/15 186 lb (84.369 kg)  08/10/15 181 lb (82.101 kg)  08/09/15 181 lb (82.101 kg)  body mass index is 37.55 kg/(m^2).  Physical Exam  Constitutional: She appears well-developed and well-nourished. No distress.  obese  HENT:  Mouth/Throat: Mucous membranes are normal. Mucous membranes are not dry.  Right ear has erythema and lichenification along the right helix, some scabbing  Cardiovascular: Normal rate and regular rhythm.   Pulmonary/Chest: Effort normal and breath sounds normal.  Abdominal: There is no tenderness. There is no guarding.  Musculoskeletal:       Lumbar back: She exhibits decreased range of motion (limited with flexion and extension). She exhibits no bony tenderness, no swelling and no edema.  Neurological: She displays no atrophy and no tremor. She exhibits normal muscle tone. Gait normal.  Negative SLR  Skin: No  bruising noted.  Rough patch with mild erythema on the back of the neck in the hair line  Psychiatric: She has a normal mood and affect.    Results for orders placed or performed in visit on 08/18/15  UA/M w/rflx Culture, Routine (STAT)  Result Value Ref Range   Specific Gravity, UA 1.025 1.005 - 1.030   pH, UA 5.5 5.0 - 7.5   Color, UA Yellow Yellow   Appearance Ur Clear Clear   Leukocytes, UA Negative Negative   Protein, UA Negative Negative/Trace   Glucose, UA 3+ (A) Negative   Ketones, UA Trace (A) Negative   RBC, UA Negative Negative   Bilirubin, UA Negative Negative   Urobilinogen, Ur 0.2 0.2 - 1.0 mg/dL   Nitrite, UA Negative Negative      Assessment & Plan:   Problem List Items  Addressed This Visit      Musculoskeletal and Integument   Skin infection    Of ear, will use topical agent and refer back to derm if not resolving; ddx includes chondrodermatitis nodularis chronica helicis       Relevant Medications   mupirocin cream (BACTROBAN) 2 %     Other   Diarrhea    Following antibiotic use; will check C diff toxin assay; supportive care, hydration      Relevant Orders   Stool C-Diff Toxin Assay   Low back pain with left-sided sciatica    Evaluated in the ER; urine checked today; no red flags; will refer to PT      Relevant Orders   Ambulatory referral to Physical Therapy    Other Visit Diagnoses    Urinary frequency    -  Primary    check urine today; 3+ glucosuria; likely related to out of control blood sugars; patient to see endocrinologist soon    Relevant Orders    UA/M w/rflx Culture, Routine (STAT) (Completed)    Breast cancer screening        Relevant Orders    MM DIGITAL SCREENING BILATERAL       Follow up plan: No Follow-up on file.  Orders Placed This Encounter  Procedures  . Stool C-Diff Toxin Assay  . MM DIGITAL SCREENING BILATERAL  . UA/M w/rflx Culture, Routine (STAT)  . Ambulatory referral to Physical Therapy   Meds ordered this encounter  Medications  . ACCU-CHEK SOFTCLIX LANCETS lancets    Sig:   . ACCU-CHEK AVIVA PLUS test strip    Sig:   . REXULTI 1 MG TABS    Sig: Take 1 mg by mouth.  . zolpidem (AMBIEN) 10 MG tablet    Sig: Take 10 mg by mouth.  . mupirocin cream (BACTROBAN) 2 %    Sig: Apply 1 application topically 2 (two) times daily.    Dispense:  15 g    Refill:  0    Medications Discontinued During This Encounter  Medication Reason  . ciprofloxacin (CIPRO) 250 MG tablet Completed Course  . fluconazole (DIFLUCAN) 150 MG tablet Completed Course  . oxyCODONE-acetaminophen (ROXICET) 5-325 MG tablet Completed Course  . baclofen (LIORESAL) 10 MG tablet Ineffective

## 2015-08-19 IMAGING — CR DG SHOULDER 3+V*L*
1 series · 4 of 4 positions shown · non-contrast
Comparison: None available for comparison at time of study
interpretation.

CLINICAL DATA: Chronic pain with cortisone shot, recent fall.

EXAM:
DG SHOULDER 3+VIEWS LEFT

[Series 2: w shoulder grashey left · 0.14mm/px · 4 of 4 slices shown]
[im 1/4]
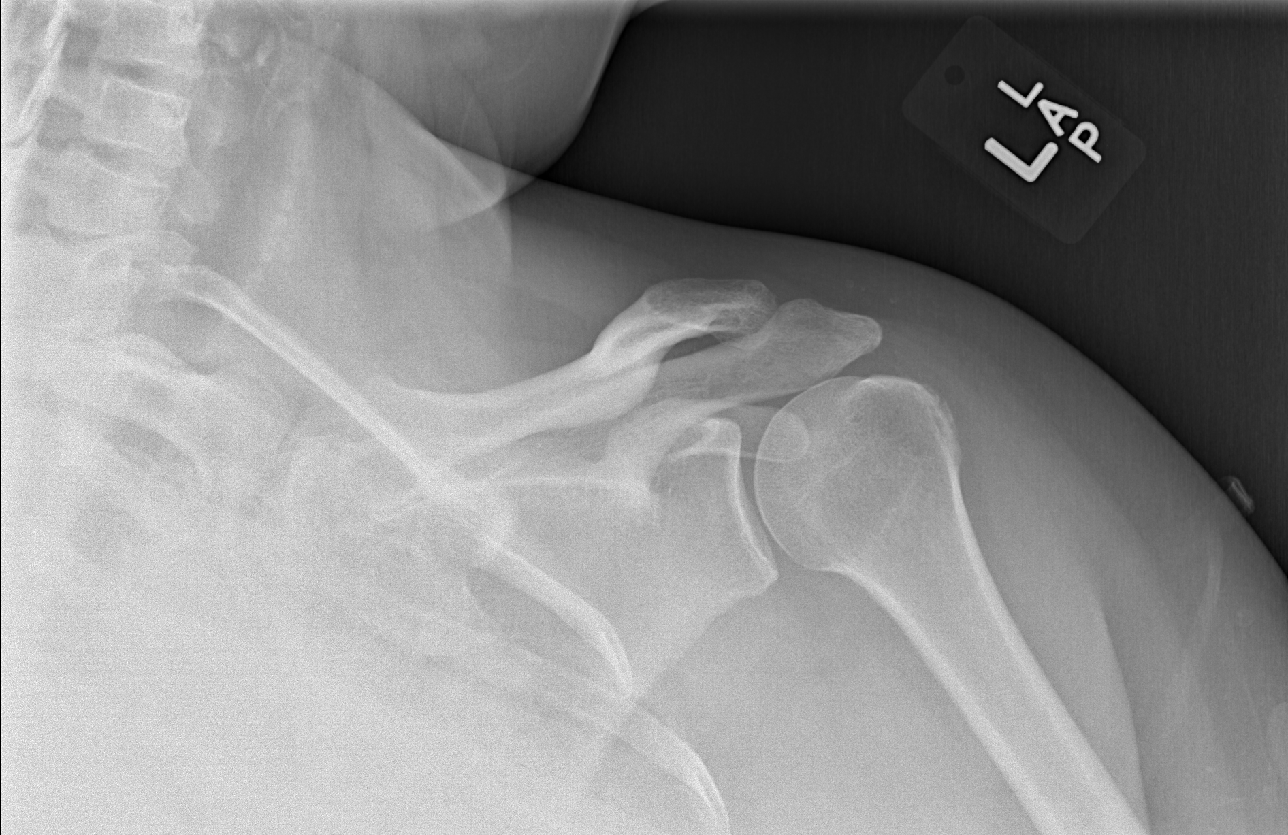
[im 2/4]
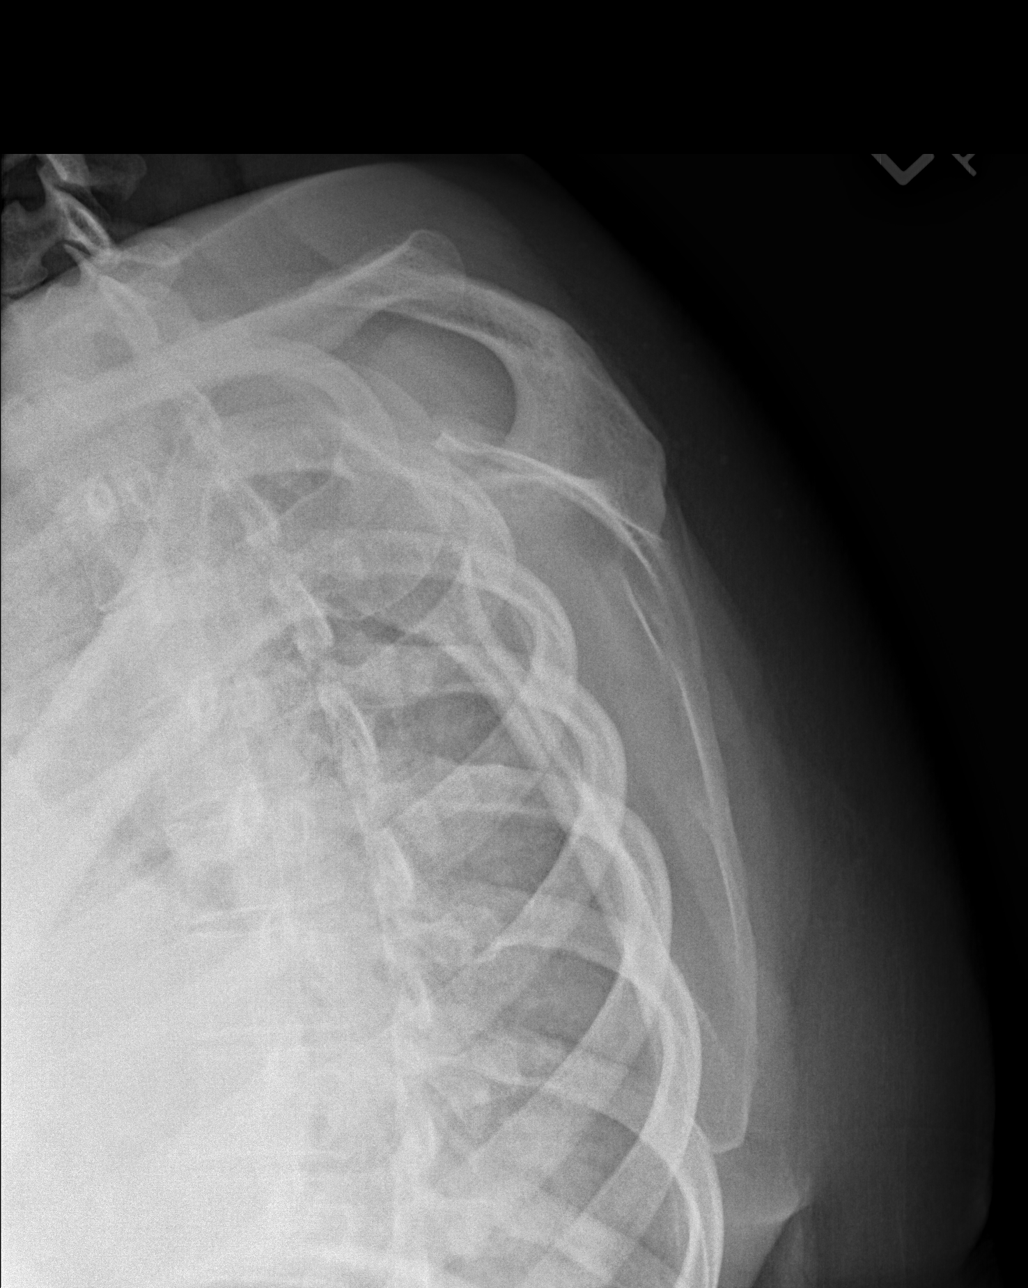
[im 3/4]
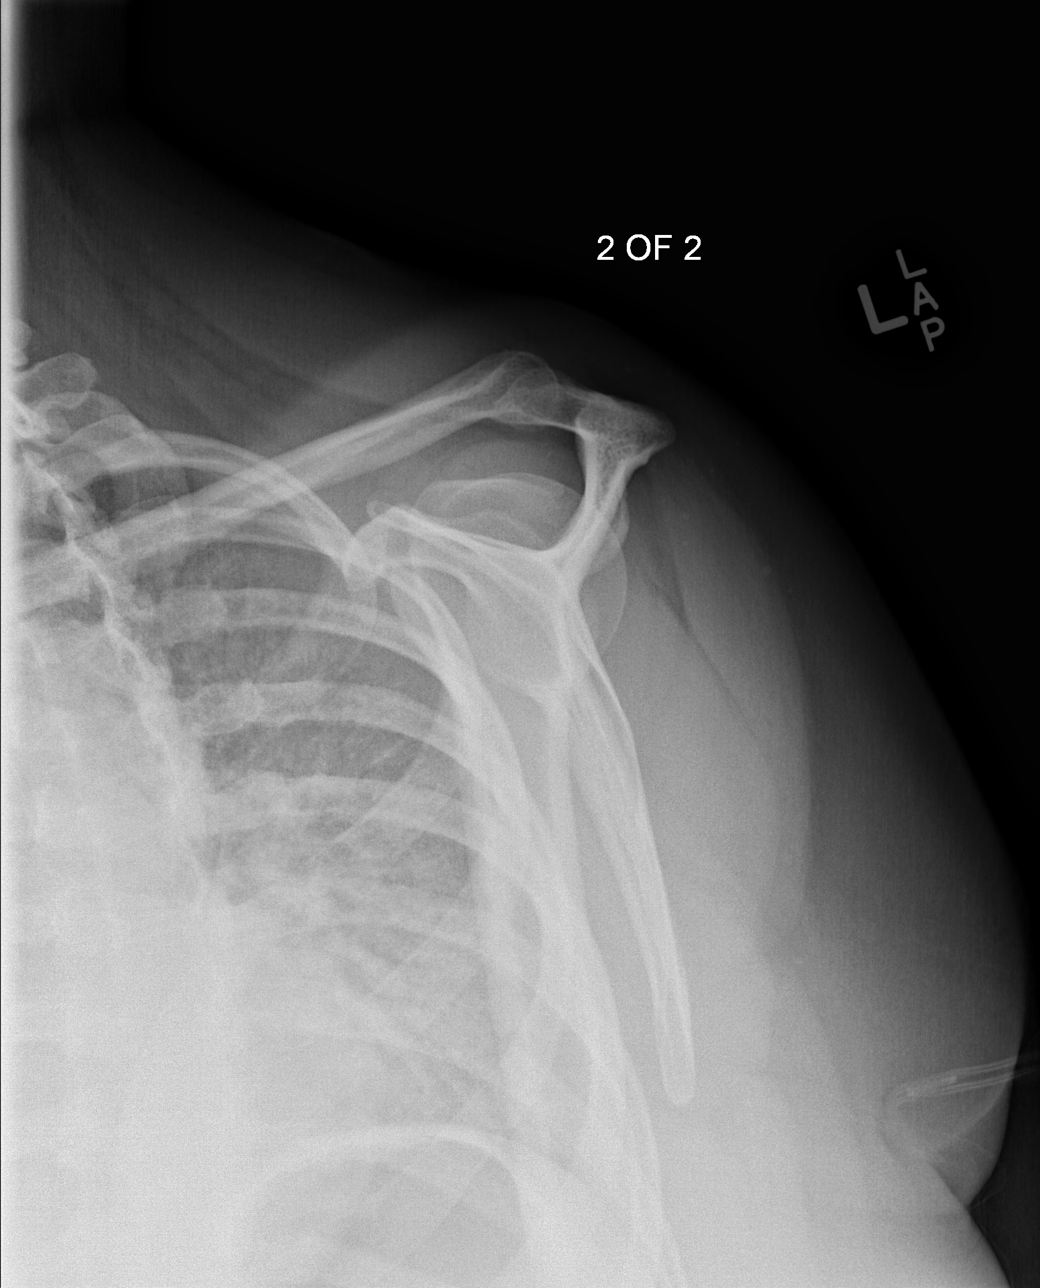
[im 4/4]
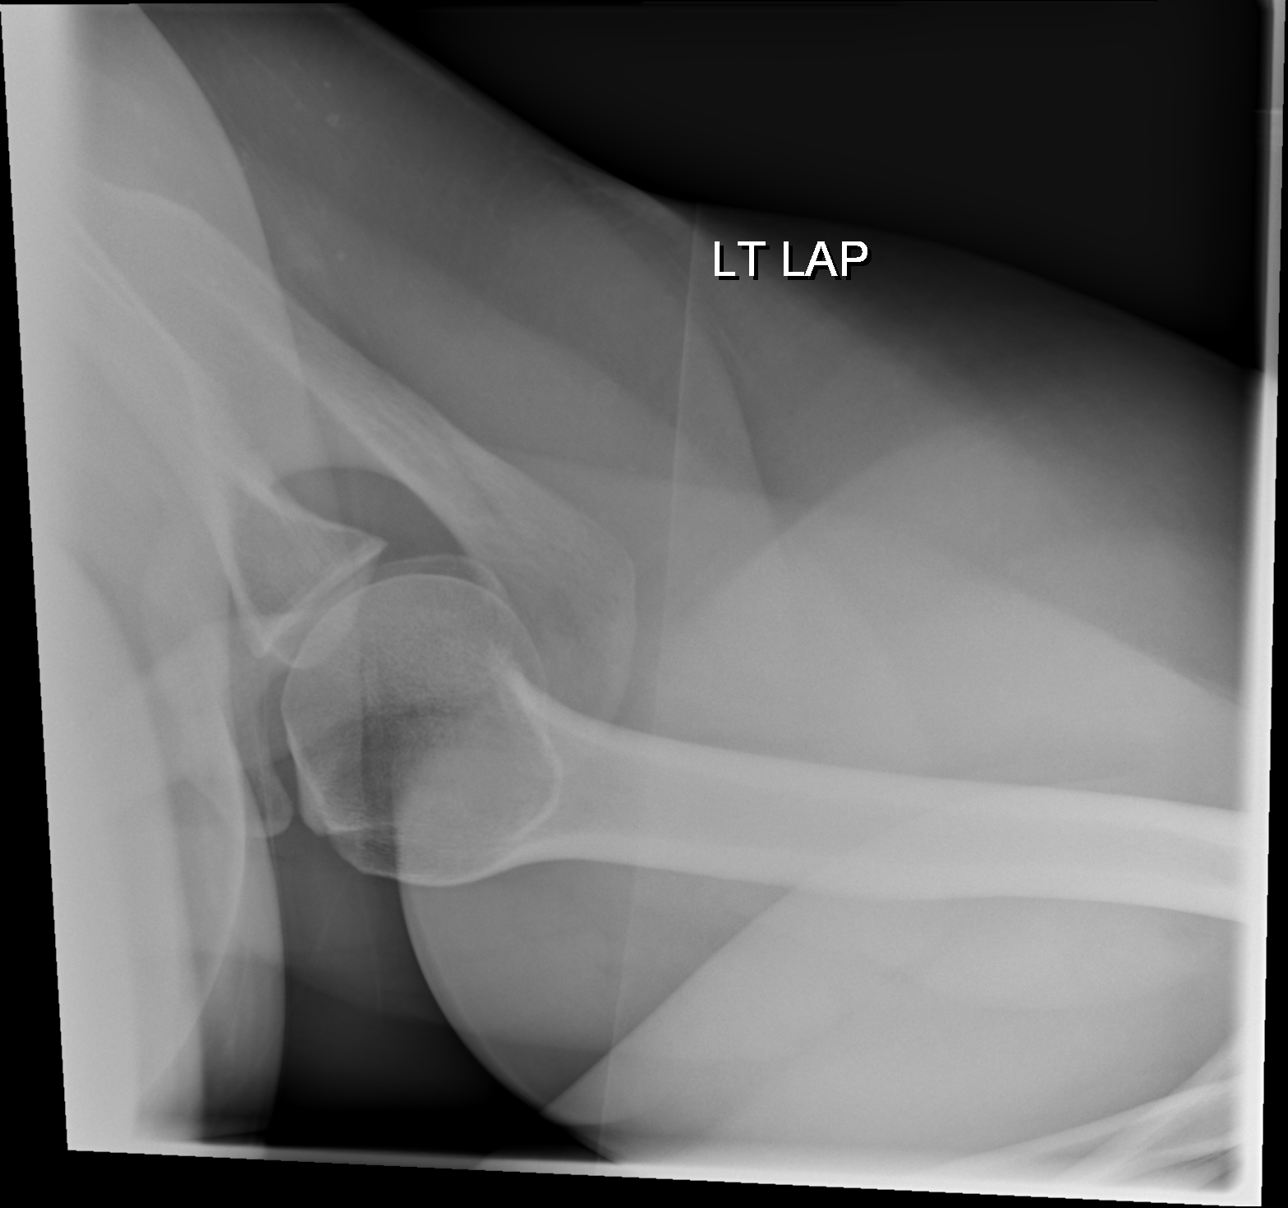

[4 of 4 positions shown; findings below may reference images not displayed]

FINDINGS: The humeral head is well-formed and located. The subacromial,
glenohumeral and acromioclavicular joint spaces are intact. No
destructive bony lesions. Soft tissue planes are non-suspicious.
IMPRESSION: Negative.

  By: Affef Hidhli

## 2015-08-20 DIAGNOSIS — G4733 Obstructive sleep apnea (adult) (pediatric): Secondary | ICD-10-CM | POA: Diagnosis not present

## 2015-08-20 DIAGNOSIS — R0902 Hypoxemia: Secondary | ICD-10-CM | POA: Diagnosis not present

## 2015-08-20 DIAGNOSIS — J449 Chronic obstructive pulmonary disease, unspecified: Secondary | ICD-10-CM | POA: Diagnosis not present

## 2015-08-23 NOTE — Assessment & Plan Note (Signed)
Of ear, will use topical agent and refer back to derm if not resolving; ddx includes chondrodermatitis nodularis chronica helicis

## 2015-08-23 NOTE — Assessment & Plan Note (Signed)
Following antibiotic use; will check C diff toxin assay; supportive care, hydration

## 2015-08-23 NOTE — Assessment & Plan Note (Signed)
Evaluated in the ER; urine checked today; no red flags; will refer to PT

## 2015-08-27 ENCOUNTER — Telehealth: Payer: Self-pay | Admitting: Family Medicine

## 2015-08-27 MED ORDER — METRONIDAZOLE 500 MG PO TABS: 500.0000 mg | ORAL_TABLET | Freq: Three times a day (TID) | ORAL | Status: DC

## 2015-08-27 MED ORDER — BENZONATATE 100 MG PO CAPS: 100.0000 mg | ORAL_CAPSULE | Freq: Three times a day (TID) | ORAL | Status: DC | PRN

## 2015-08-27 NOTE — Telephone Encounter (Signed)
Pt called stated she needs [name] to call her. Pt thinks she may need a referral to a stomach doctor. Please call pt ASAP. Thanks.

## 2015-08-27 NOTE — Telephone Encounter (Signed)
She has been having diarrhea, two days; she turned in the C diff sample; we just don't have results back yet; will start metronidazole; she's also coughing, rx for tessalon perles; usual pharmacy may be closed so she wants [facility] [address]

## 2015-08-27 NOTE — Telephone Encounter (Signed)
I reached voicemail; I explained that I will try her later, but if she has something urgent going on, call on-call doctor or get to an urgent care or ER

## 2015-08-28 LAB — CLOSTRIDIUM DIFFICILE EIA: C difficile Toxins A+B, EIA: NEGATIVE

## 2015-08-31 ENCOUNTER — Emergency Department
Admission: EM | Admit: 2015-08-31 | Discharge: 2015-08-31 | Disposition: A | Discharge status: Home / Self Care | Payer: Medicare Other

## 2015-09-02 ENCOUNTER — Encounter: Payer: Self-pay | Admitting: Emergency Medicine

## 2015-09-02 ENCOUNTER — Emergency Department
Admission: EM | Admit: 2015-09-02 | Discharge: 2015-09-02 | Disposition: A | Discharge status: Home / Self Care | Payer: Medicare Other | Attending: Emergency Medicine | Admitting: Emergency Medicine

## 2015-09-02 DIAGNOSIS — Z7951 Long term (current) use of inhaled steroids: Secondary | ICD-10-CM | POA: Diagnosis not present

## 2015-09-02 DIAGNOSIS — Z79899 Other long term (current) drug therapy: Secondary | ICD-10-CM | POA: Insufficient documentation

## 2015-09-02 DIAGNOSIS — Z791 Long term (current) use of non-steroidal anti-inflammatories (NSAID): Secondary | ICD-10-CM | POA: Insufficient documentation

## 2015-09-02 DIAGNOSIS — G8929 Other chronic pain: Secondary | ICD-10-CM | POA: Diagnosis not present

## 2015-09-02 DIAGNOSIS — N183 Chronic kidney disease, stage 3 (moderate): Secondary | ICD-10-CM | POA: Diagnosis not present

## 2015-09-02 DIAGNOSIS — M5442 Lumbago with sciatica, left side: Secondary | ICD-10-CM | POA: Insufficient documentation

## 2015-09-02 DIAGNOSIS — Z794 Long term (current) use of insulin: Secondary | ICD-10-CM | POA: Insufficient documentation

## 2015-09-02 DIAGNOSIS — Z792 Long term (current) use of antibiotics: Secondary | ICD-10-CM | POA: Diagnosis not present

## 2015-09-02 DIAGNOSIS — I129 Hypertensive chronic kidney disease with stage 1 through stage 4 chronic kidney disease, or unspecified chronic kidney disease: Secondary | ICD-10-CM | POA: Diagnosis not present

## 2015-09-02 DIAGNOSIS — M549 Dorsalgia, unspecified: Secondary | ICD-10-CM | POA: Diagnosis present

## 2015-09-02 DIAGNOSIS — E1140 Type 2 diabetes mellitus with diabetic neuropathy, unspecified: Secondary | ICD-10-CM | POA: Diagnosis not present

## 2015-09-02 MED ORDER — KETOROLAC TROMETHAMINE 60 MG/2ML IM SOLN
60.0000 mg | Freq: Once | INTRAMUSCULAR | Status: AC
  Administered 2015-09-02: 60 mg via INTRAMUSCULAR
  Filled 2015-09-02: qty 2

## 2015-09-02 MED ORDER — MELOXICAM 15 MG PO TABS: 15.0000 mg | ORAL_TABLET | Freq: Every day | ORAL | Status: DC

## 2015-09-02 MED ORDER — OXYCODONE-ACETAMINOPHEN 5-325 MG PO TABS
1.0000 | ORAL_TABLET | Freq: Four times a day (QID) | ORAL | Status: DC | PRN
Start: 2015-09-02 — End: 2015-09-22

## 2015-09-02 MED ORDER — DIAZEPAM 2 MG PO TABS
2.0000 mg | ORAL_TABLET | Freq: Once | ORAL | Status: AC
  Administered 2015-09-02: 2 mg via ORAL
  Filled 2015-09-02: qty 1

## 2015-09-02 MED ORDER — DIAZEPAM 2 MG PO TABS: 2.0000 mg | ORAL_TABLET | Freq: Three times a day (TID) | ORAL | Status: DC | PRN

## 2015-09-02 NOTE — ED Provider Notes (Signed)
[facility] Emergency Department Provider Note ?  ? ____________________________________________ ? Time seen: 5:14 PM ? I have reviewed the triage vital signs and the nursing notes.  ________ HISTORY ? Chief Complaint Back Pain     HPI  [name] is a 49 y.o. female   who presents emergency department for continual sciatica pain. Patient is followed by phone to orthopedics for these conditions but was unable to schedule an appointment. Patient now has an appointment for spine specialist in [place] in 2 weeks. Patient is endorsing increased radicular symptoms. She denies any saddle anesthesia, bowel or bladder dysfunction, paresthesias. Patient states that symptoms are consistent with previous chest pain or worsened as far as pain allows concern. ? ? ? Past Medical History  Diagnosis Date  . High blood pressure   . Heart attack ([place])   . Irregular heart rhythm   . Seasonal allergies   . Chronic headaches   . Sleep apnea   . Hyperlipidemia   . COPD (chronic obstructive pulmonary disease) ([place])   . Asthma   . Hypothyroid   . Morbid obesity ([place])   . OSA (obstructive sleep apnea)   . RLS (restless legs syndrome)   . GERD (gastroesophageal reflux disease)   . Bipolar depression ([place])   . Vitamin D deficiency   . Urinary incontinence   . Hypoxemia   . Liver lesion     Favored to be benign on CT; MRI of abd w/contract in Aug 2016. Liver Biopsy-Negative, March 2016  . Acute renal failure ([place])   . Hypomagnesemia   . Hypertension   . Headache   . Diabetes mellitus without complication (HCC)     Type 2, insulin dependent. Patient also takes Metformin.  . CHF (congestive heart failure) ([place])   . Diabetic neuropathy ([place])   . Pinched nerve     Back    Patient Active Problem List   Diagnosis Date Noted  . Diarrhea 08/18/2015  . Low back pain with left-sided sciatica 08/18/2015  . Skin infection 08/18/2015  . Encounter for monitoring  tricyclic antidepressant therapy 05/13/2015  . Bipolar disorder ([place]) 05/11/2015  . Liver disease 05/11/2015  . Diabetes ([place]) 05/11/2015  . Apnea, sleep 05/05/2015  . Heart valve disease 05/05/2015  . Urinary tract infection 03/24/2015  . Chronic kidney disease, stage III (moderate) 03/15/2015  . Anemia 03/15/2015  . Migraine 03/12/2015  . COPD (chronic obstructive pulmonary disease) ([place]) 01/20/2015  . 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 GERD (gastroesophageal reflux disease) 01/20/2015  . Disorder of smooth muscle 01/20/2015  . Hepatic encephalopathy ([place]) 01/20/2015  . Hypothyroid   . RLS (restless legs syndrome)   . Vitamin D deficiency   . Urinary incontinence   . Liver lesion   . Acute renal failure ([place])   . Hypomagnesemia   . Obstructive sleep apnea 10/20/2013  . PERIPHERAL NEUROPATHY 03/04/2009  . HEMORRHOIDS, EXTERNAL 09/03/2008  . ALLERGIC RHINITIS 07/04/2007  . ASTHMA, PERSISTENT, MODERATE 07/04/2007  . INTERSTITIAL CYSTITIS 07/04/2007  . HYPERTRIGLYCERIDEMIA 07/03/2007  . FATIGUE, CHRONIC 07/03/2007  . HYPERTENSION, BENIGN ESSENTIAL 06/19/2007   ? Past Surgical History  Procedure Laterality Date  . Abdominal hysterectomy    . Tubal ligation    . Liver biopsy  March 2016    Negative  . Abdominal hysterectomy      Partial  . Cholecystectomy    . Bladder surgery      x 2  . Lung biopsy     ?  Current Outpatient Rx  Name  Route  Sig  Dispense  Refill  . ACCU-CHEK AVIVA PLUS test strip                 Dispense as written.   Marland Kitchen ACCU-CHEK SOFTCLIX LANCETS lancets               . benzonatate (TESSALON) 100 MG capsule   Oral   Take 1 capsule (100 mg total) by mouth every 8 (eight) hours as needed for cough.   30 capsule   0   . Choline Fenofibrate (FENOFIBRIC ACID) 45 MG CPDR      TAKE 1 CAPSULE BY MOUTH EACH DAY   30 capsule   5   . diazepam (VALIUM) 2 MG tablet   Oral   Take 1 tablet (2 mg total) by mouth every 8 (eight)  hours as needed for anxiety.   15 tablet   0   . DULoxetine (CYMBALTA) 60 MG capsule   Oral   Take 120 mg by mouth daily.         . fluticasone (FLONASE) 50 MCG/ACT nasal spray   Each Nare   Place 2 sprays into both nostrils daily.   16 g   2   . insulin lispro (HUMALOG) 100 UNIT/ML injection   Subcutaneous   Inject into the skin 3 (three) times daily as needed for high blood sugar.         . insulin NPH Human (HUMULIN N,NOVOLIN N) 100 UNIT/ML injection   Subcutaneous   Inject into the skin. 75 units in the am, 50 units in at lunch, 75 units at night         . lamoTRIgine (LAMICTAL) 150 MG tablet   Oral   Take 150 mg by mouth daily.          Marland Kitchen levothyroxine (SYNTHROID, LEVOTHROID) 75 MCG tablet      TAKE 1 TABLET BY MOUTH EVERY DAY.   30 tablet   11   . Linaclotide (LINZESS) 145 MCG CAPS capsule   Oral   Take 1 capsule (145 mcg total) by mouth daily.   30 capsule   6   . magnesium oxide (MAG-OX) 400 MG tablet   Oral   Take 400 mg by mouth 2 (two) times daily.         . meloxicam (MOBIC) 15 MG tablet   Oral   Take 1 tablet (15 mg total) by mouth daily.   30 tablet   0   . metoprolol tartrate (LOPRESSOR) 25 MG tablet      TAKE 1/2 TABLET BY MOUTH TWICE A DAY   30 tablet   5   . metroNIDAZOLE (FLAGYL) 500 MG tablet   Oral   Take 1 tablet (500 mg total) by mouth 3 (three) times daily.   30 tablet   0   . Multiple Vitamin (MULTIVITAMIN) capsule   Oral   Take by mouth.         . mupirocin cream (BACTROBAN) 2 %   Topical   Apply 1 application topically 2 (two) times daily.   15 g   0   . oxyCODONE-acetaminophen (ROXICET) 5-325 MG tablet   Oral   Take 1 tablet by mouth every 6 (six) hours as needed for severe pain.   20 tablet   0   . pantoprazole (PROTONIX) 40 MG tablet   Oral   Take 1 tablet (40 mg total) by mouth daily.   [place]  tablet   12   . QUEtiapine (SEROQUEL) 25 MG tablet   Oral   Take 25 mg by mouth every morning. Take one  in am, one at lunch         . QUEtiapine (SEROQUEL) 400 MG tablet   Oral   Take 800 mg by mouth at bedtime. Take 2 tabs at bedtime         . REXULTI 1 MG TABS   Oral   Take 1 mg by mouth.           Dispense as written.   . topiramate (TOPAMAX) 25 MG tablet   Oral   Take 1 tablet (25 mg total) by mouth 2 (two) times daily.   120 tablet   3   . zolpidem (AMBIEN) 10 MG tablet   Oral   Take 10 mg by mouth.          ? Allergies Morphine and related; Codeine; Codeine; Hydrocodone-acetaminophen; Mirtazapine; Morphine and related; Sulfamethoxazole-trimethoprim; and Vicodin ? Family History  Problem Relation Age of Onset  . Heart disease Maternal Grandfather   . Rheum arthritis Maternal Grandfather   . Cancer Maternal Grandfather     liver  . Cancer Paternal Grandmother     lung  . Diabetes Mother   . Heart disease Mother   . Hyperlipidemia Mother   . Hypertension Mother   . Kidney disease Mother   . Mental illness Mother   . Hypothyroidism Mother   . Stroke Mother   . Osteoporosis Mother   . Glaucoma Mother   . Hypertension Father   . Asthma Daughter   . Cancer Daughter     throat   ? Social History Social History  Substance Use Topics  . Smoking status: Never Smoker   . Smokeless tobacco: Never Used  . Alcohol Use: No   ? Review of Systems Constitutional: no fever. Eyes: no discharge ENT: no sore throat. Cardiovascular: no chest pain. Respiratory: no cough. No sob Gastrointestinal: denies abdominal pain, vomiting, diarrhea, and constipation Genitourinary: no dysuria. Negative for hematuria Musculoskeletal: Endorses back pain with radicular symptoms down bilateral legs. Skin: Negative for rash. Neurological: Negative for headaches  10-point ROS otherwise negative.  _______________ PHYSICAL EXAM: ? VITAL SIGNS:   ED Triage Vitals  Enc Vitals Group     BP 09/02/15 1605 146/69 mmHg     Pulse Rate 09/02/15 1605 102     Resp 09/02/15 1605 18      Temp 09/02/15 1605 98.7 F (37.1 C)     Temp Source 09/02/15 1605 Oral     SpO2 09/02/15 1605 95 %     Weight 09/02/15 1605 186 lb (84.369 kg)     Height 09/02/15 1605 4' 11"  (1.499 m)     Head Cir --      Peak Flow --      Pain Score 09/02/15 1605 8     Pain Loc --      Pain Edu? --      Excl. in [place]? --    ?  Constitutional: Alert and oriented. Well appearing and in no distress. Eyes: Conjunctivae are normal.  ENT      Head: Normocephalic and atraumatic.      Ears:       Nose: No congestion/rhinnorhea.      Mouth/Throat: Mucous membranes are moist. Hematological/Lymphatic/Immunilogical: No cervical lymphadenopathy. Cardiovascular: Normal rate, regular rhythm.  Respiratory: Normal respiratory effort without tachypnea nor retractions. Gastrointestinal: Soft and nontender. No  distention. There is no CVA tenderness. Genitourinary:  Musculoskeletal: Nontender with normal range of motion in all extremities. No visible deformity to spine upon inspection. Patient is diffusely tender to palpation midline and left paraspinal muscle group in the lumbar region. Pain to palpation over the sciatic notch. Positive straight leg raise bilaterally. Pulses are palpated bilaterally in the dorsalis pedis location. Sensation is equal and present in bilateral lower extremities.  Neurologic:  Normal speech and language. No gross focal neurologic deficits are appreciated. Skin:  Skin is warm, dry and intact. No rash noted. Psychiatric: Mood and affect are normal. Speech and behavior are normal. Patient exhibits appropriate insight and judgment.    ___________ RADIOLOGY    _____________ PROCEDURES ? Procedure(s) performed:    Medications  diazepam (VALIUM) tablet 2 mg (not administered)  ketorolac (TORADOL) injection 60 mg (not administered)    ______________________________________________________ INITIAL IMPRESSION / ASSESSMENT AND PLAN / ED COURSE ? Pertinent labs & imaging results  that were available during my care of the patient were reviewed by me and considered in my medical decision making (see chart for details).    Patient presents to the emergency department with a known diagnosis of sciatica. Patient is to see a spine specialist in 2 weeks for same. She presents to the emergency department because she is out of medications at this time. Patient is given Toradol and Valium here in the emergency department. She'll be discharged home with anti-inflammatories, muscle relaxers, and pain medication. Patient will follow-up with a spine specialist in 2 weeks.    New Prescriptions   DIAZEPAM (VALIUM) 2 MG TABLET    Take 1 tablet (2 mg total) by mouth every 8 (eight) hours as needed for anxiety.   MELOXICAM (MOBIC) 15 MG TABLET    Take 1 tablet (15 mg total) by mouth daily.   OXYCODONE-ACETAMINOPHEN (ROXICET) 5-325 MG TABLET    Take 1 tablet by mouth every 6 (six) hours as needed for severe pain.   ____________________________________________ FINAL CLINICAL IMPRESSION(S) / ED DIAGNOSES?  Final diagnoses:  Chronic left-sided low back pain with left-sided sciatica       Darletta Moll, PA-C 09/02/15 1714  Delman Kitten, MD 09/02/15 2052

## 2015-09-02 NOTE — ED Notes (Signed)
Left lower back pain radiating down through buttocks, tingling/numbness .  Hx of same last seen approx x1 month ago

## 2015-09-02 NOTE — ED Notes (Signed)
States she is having a sciatica flare  Having lower back pain which radiates into legs for the past couple of days  Denies any injury

## 2015-09-02 NOTE — Discharge Instructions (Signed)
Chronic Back Pain  When back pain lasts longer than 3 months, it is called chronic back pain.People with chronic back pain often go through certain periods that are more intense (flare-ups).  CAUSES Chronic back pain can be caused by wear and tear (degeneration) on different structures in your back. These structures include:  The bones of your spine (vertebrae) and the joints surrounding your spinal cord and nerve roots (facets).  The strong, fibrous tissues that connect your vertebrae (ligaments). Degeneration of these structures may result in pressure on your nerves. This can lead to constant pain. HOME CARE INSTRUCTIONS  Avoid bending, heavy lifting, prolonged sitting, and activities which make the problem worse.  Take brief periods of rest throughout the day to reduce your pain. Lying down or standing usually is better than sitting while you are resting.  Take over-the-counter or prescription medicines only as directed by your caregiver. SEEK IMMEDIATE MEDICAL CARE IF:   You have weakness or numbness in one of your legs or feet.  You have trouble controlling your bladder or bowels.  You have nausea, vomiting, abdominal pain, shortness of breath, or fainting.   This information is not intended to replace advice given to you by your health care provider. Make sure you discuss any questions you have with your health care provider.   Document Released: 09/28/2004 Document Revised: 11/13/2011 Document Reviewed: 02/08/2015 Elsevier Interactive Patient Education 2016 [employer]  Sciatica Sciatica is pain, weakness, numbness, or tingling along the path of the sciatic nerve. The nerve starts in the lower back and runs down the back of each leg. The nerve controls the muscles in the lower leg and in the back of the knee, while also providing sensation to the back of the thigh, lower leg, and the sole of your foot. Sciatica is a symptom of another medical condition. For instance, nerve  damage or certain conditions, such as a herniated disk or bone spur on the spine, pinch or put pressure on the sciatic nerve. This causes the pain, weakness, or other sensations normally associated with sciatica. Generally, sciatica only affects one side of the body. CAUSES   Herniated or slipped disc.  Degenerative disk disease.  A pain disorder involving the narrow muscle in the buttocks (piriformis syndrome).  Pelvic injury or fracture.  Pregnancy.  Tumor (rare). SYMPTOMS  Symptoms can vary from mild to very severe. The symptoms usually travel from the low back to the buttocks and down the back of the leg. Symptoms can include:  Mild tingling or dull aches in the lower back, leg, or hip.  Numbness in the back of the calf or sole of the foot.  Burning sensations in the lower back, leg, or hip.  Sharp pains in the lower back, leg, or hip.  Leg weakness.  Severe back pain inhibiting movement. These symptoms may get worse with coughing, sneezing, laughing, or prolonged sitting or standing. Also, being overweight may worsen symptoms. DIAGNOSIS  Your caregiver will perform a physical exam to look for common symptoms of sciatica. He or she may ask you to do certain movements or activities that would trigger sciatic nerve pain. Other tests may be performed to find the cause of the sciatica. These may include:  Blood tests.  X-rays.  Imaging tests, such as an MRI or CT scan. TREATMENT  Treatment is directed at the cause of the sciatic pain. Sometimes, treatment is not necessary and the pain and discomfort goes away on its own. If treatment is needed, your  caregiver may suggest:  Over-the-counter medicines to relieve pain.  Prescription medicines, such as anti-inflammatory medicine, muscle relaxants, or narcotics.  Applying heat or ice to the painful area.  Steroid injections to lessen pain, irritation, and inflammation around the nerve.  Reducing activity during periods of  pain.  Exercising and stretching to strengthen your abdomen and improve flexibility of your spine. Your caregiver may suggest losing weight if the extra weight makes the back pain worse.  Physical therapy.  Surgery to eliminate what is pressing or pinching the nerve, such as a bone spur or part of a herniated disk. HOME CARE INSTRUCTIONS   Only take over-the-counter or prescription medicines for pain or discomfort as directed by your caregiver.  Apply ice to the affected area for 20 minutes, 3-4 times a day for the first 48-72 hours. Then try heat in the same way.  Exercise, stretch, or perform your usual activities if these do not aggravate your pain.  Attend physical therapy sessions as directed by your caregiver.  Keep all follow-up appointments as directed by your caregiver.  Do not wear high heels or shoes that do not provide proper support.  Check your mattress to see if it is too soft. A firm mattress may lessen your pain and discomfort. SEEK IMMEDIATE MEDICAL CARE IF:   You lose control of your bowel or bladder (incontinence).  You have increasing weakness in the lower back, pelvis, buttocks, or legs.  You have redness or swelling of your back.  You have a burning sensation when you urinate.  You have pain that gets worse when you lie down or awakens you at night.  Your pain is worse than you have experienced in the past.  Your pain is lasting longer than 4 weeks.  You are suddenly losing weight without reason. MAKE SURE YOU:  Understand these instructions.  Will watch your condition.  Will get help right away if you are not doing well or get worse.   This information is not intended to replace advice given to you by your health care provider. Make sure you discuss any questions you have with your health care provider.   Document Released: 08/15/2001 Document Revised: 05/12/2015 Document Reviewed: 12/31/2011 Elsevier Interactive Patient Education [employer]

## 2015-09-07 ENCOUNTER — Ambulatory Visit: Payer: Medicare Other | Admitting: Physical Therapy

## 2015-09-09 ENCOUNTER — Ambulatory Visit: Payer: Medicare Other | Admitting: Physical Therapy

## 2015-09-10 ENCOUNTER — Encounter: Payer: Self-pay | Admitting: Emergency Medicine

## 2015-09-10 ENCOUNTER — Emergency Department
Admission: EM | Admit: 2015-09-10 | Discharge: 2015-09-10 | Disposition: A | Discharge status: Home / Self Care | Payer: Medicaid Other | Attending: Emergency Medicine | Admitting: Emergency Medicine

## 2015-09-10 DIAGNOSIS — N189 Chronic kidney disease, unspecified: Secondary | ICD-10-CM | POA: Diagnosis not present

## 2015-09-10 DIAGNOSIS — Z791 Long term (current) use of non-steroidal anti-inflammatories (NSAID): Secondary | ICD-10-CM | POA: Insufficient documentation

## 2015-09-10 DIAGNOSIS — Z792 Long term (current) use of antibiotics: Secondary | ICD-10-CM | POA: Diagnosis not present

## 2015-09-10 DIAGNOSIS — E875 Hyperkalemia: Secondary | ICD-10-CM | POA: Diagnosis not present

## 2015-09-10 DIAGNOSIS — Z794 Long term (current) use of insulin: Secondary | ICD-10-CM | POA: Diagnosis not present

## 2015-09-10 DIAGNOSIS — R748 Abnormal levels of other serum enzymes: Secondary | ICD-10-CM

## 2015-09-10 DIAGNOSIS — M549 Dorsalgia, unspecified: Secondary | ICD-10-CM | POA: Diagnosis not present

## 2015-09-10 DIAGNOSIS — N179 Acute kidney failure, unspecified: Secondary | ICD-10-CM | POA: Diagnosis not present

## 2015-09-10 DIAGNOSIS — M5441 Lumbago with sciatica, right side: Secondary | ICD-10-CM | POA: Diagnosis not present

## 2015-09-10 DIAGNOSIS — I129 Hypertensive chronic kidney disease with stage 1 through stage 4 chronic kidney disease, or unspecified chronic kidney disease: Secondary | ICD-10-CM | POA: Diagnosis not present

## 2015-09-10 DIAGNOSIS — N183 Chronic kidney disease, stage 3 (moderate): Secondary | ICD-10-CM | POA: Insufficient documentation

## 2015-09-10 DIAGNOSIS — G8929 Other chronic pain: Secondary | ICD-10-CM

## 2015-09-10 DIAGNOSIS — E1140 Type 2 diabetes mellitus with diabetic neuropathy, unspecified: Secondary | ICD-10-CM | POA: Insufficient documentation

## 2015-09-10 DIAGNOSIS — R945 Abnormal results of liver function studies: Secondary | ICD-10-CM | POA: Diagnosis not present

## 2015-09-10 DIAGNOSIS — Z79899 Other long term (current) drug therapy: Secondary | ICD-10-CM | POA: Diagnosis not present

## 2015-09-10 DIAGNOSIS — I120 Hypertensive chronic kidney disease with stage 5 chronic kidney disease or end stage renal disease: Secondary | ICD-10-CM | POA: Diagnosis not present

## 2015-09-10 DIAGNOSIS — M545 Low back pain: Secondary | ICD-10-CM | POA: Diagnosis present

## 2015-09-10 LAB — CBC
HEMATOCRIT: 33.5 % — AB (ref 35.0–47.0)
HEMOGLOBIN: 10.8 g/dL — AB (ref 12.0–16.0)
MCH: 28.4 pg (ref 26.0–34.0)
MCHC: 32.3 g/dL (ref 32.0–36.0)
MCV: 88 fL (ref 80.0–100.0)
Platelets: 273 10*3/uL (ref 150–440)
RBC: 3.81 MIL/uL (ref 3.80–5.20)
RDW: 14.9 % — ABNORMAL HIGH (ref 11.5–14.5)
WBC: 8.7 10*3/uL (ref 3.6–11.0)

## 2015-09-10 LAB — URINALYSIS COMPLETE WITH MICROSCOPIC (ARMC ONLY)
BILIRUBIN URINE: NEGATIVE
Glucose, UA: NEGATIVE mg/dL
HGB URINE DIPSTICK: NEGATIVE
NITRITE: NEGATIVE
PH: 5 (ref 5.0–8.0)
Protein, ur: NEGATIVE mg/dL
RBC / HPF: NONE SEEN RBC/hpf (ref 0–5)
Specific Gravity, Urine: 1.03 (ref 1.005–1.030)

## 2015-09-10 LAB — COMPREHENSIVE METABOLIC PANEL
ALBUMIN: 3.8 g/dL (ref 3.5–5.0)
ALT: 118 U/L — ABNORMAL HIGH (ref 14–54)
ANION GAP: 10 (ref 5–15)
AST: 91 U/L — ABNORMAL HIGH (ref 15–41)
Alkaline Phosphatase: 166 U/L — ABNORMAL HIGH (ref 38–126)
BUN: 43 mg/dL — ABNORMAL HIGH (ref 6–20)
CHLORIDE: 104 mmol/L (ref 101–111)
CO2: 24 mmol/L (ref 22–32)
Calcium: 10.2 mg/dL (ref 8.9–10.3)
Creatinine, Ser: 1.25 mg/dL — ABNORMAL HIGH (ref 0.44–1.00)
GFR calc Af Amer: 57 mL/min — ABNORMAL LOW (ref >60)
GFR calc non Af Amer: 49 mL/min — ABNORMAL LOW (ref >60)
GLUCOSE: 297 mg/dL — AB (ref 65–99)
POTASSIUM: 5.2 mmol/L — AB (ref 3.5–5.1)
SODIUM: 138 mmol/L (ref 135–145)
TOTAL PROTEIN: 6.9 g/dL (ref 6.5–8.1)
Total Bilirubin: 0.5 mg/dL (ref 0.3–1.2)

## 2015-09-10 LAB — LIPASE, BLOOD: Lipase: 24 U/L (ref 11–51)

## 2015-09-10 MED ORDER — CEPHALEXIN 500 MG PO CAPS: 500.0000 mg | ORAL_CAPSULE | Freq: Two times a day (BID) | ORAL | Status: DC

## 2015-09-10 MED ORDER — CYCLOBENZAPRINE HCL 10 MG PO TABS: 10.0000 mg | ORAL_TABLET | Freq: Three times a day (TID) | ORAL | Status: DC | PRN

## 2015-09-10 MED ORDER — OXYCODONE-ACETAMINOPHEN 5-325 MG PO TABS
1.0000 | ORAL_TABLET | Freq: Once | ORAL | Status: AC
  Administered 2015-09-10: 1 via ORAL

## 2015-09-10 MED ORDER — CYCLOBENZAPRINE HCL 10 MG PO TABS
ORAL_TABLET | ORAL | Status: AC
  Filled 2015-09-10: qty 1

## 2015-09-10 MED ORDER — IBUPROFEN 800 MG PO TABS: 800.0000 mg | ORAL_TABLET | Freq: Three times a day (TID) | ORAL | Status: DC | PRN

## 2015-09-10 MED ORDER — CEPHALEXIN 500 MG PO CAPS
500.0000 mg | ORAL_CAPSULE | Freq: Once | ORAL | Status: AC
  Administered 2015-09-10: 500 mg via ORAL

## 2015-09-10 MED ORDER — CEPHALEXIN 500 MG PO CAPS
ORAL_CAPSULE | ORAL | Status: AC
  Filled 2015-09-10: qty 1

## 2015-09-10 MED ORDER — SODIUM POLYSTYRENE SULFONATE 15 GM/60ML PO SUSP
30.0000 g | Freq: Once | ORAL | Status: AC
  Administered 2015-09-10: 30 g via ORAL

## 2015-09-10 MED ORDER — OXYCODONE-ACETAMINOPHEN 5-325 MG PO TABS
ORAL_TABLET | ORAL | Status: AC
  Filled 2015-09-10: qty 1

## 2015-09-10 MED ORDER — CYCLOBENZAPRINE HCL 10 MG PO TABS
5.0000 mg | ORAL_TABLET | Freq: Once | ORAL | Status: AC
  Administered 2015-09-10: 5 mg via ORAL

## 2015-09-10 MED ORDER — SODIUM POLYSTYRENE SULFONATE 15 GM/60ML PO SUSP
ORAL | Status: AC
  Filled 2015-09-10: qty 120

## 2015-09-10 NOTE — ED Provider Notes (Signed)
[facility] Emergency Department Provider Note   ____________________________________________  Time seen:  I have reviewed the triage vital signs and the triage nursing note.  HISTORY  Chief Complaint Back Pain   Historian Patient  HPI [name] is a 50 y.o. female with a history of chronic low back pain followed by her primary care physician, [name], is here for evaluation of persistent low back pain. Patient states that she had previously been on chronic pain medications, but her primary care physician is not prescribing this anymore. She states that she came to the emergency department last week for low back pain associated with left-sided sciatica and was prescribed Valium, oxycodone, and anti-inflammatory. She has finished these medications, and states they really did not help that much but they are gone now. She states the oxycodone did help some.  She reports that she has been up with her primary care doctor on January 10, and an appointment with a spine surgeon on January 29. She states she cannot receive an MRI because she has a bladder stone later. No report of new trauma. No fever.  No history of IV drug use. No history of cancer.  She's never seen a chronic pain physician, and states that she has discussed this with her primary care doctor and they are considering sending her to a chronic pain specialist.  She's had a couple episodes of diarrhea, and that time she's had trouble getting to the bathroom due to pain in her back making it difficult to walk. She's not had true incontinence of stool or urine patient states she's had a C. difficile test with her primary care physician and it was reportedly negative. She is currently not dealing with diarrhea.    Past Medical History  Diagnosis Date  . High blood pressure   . Heart attack ([place])   . Irregular heart rhythm   . Seasonal allergies   . Chronic headaches   . Sleep apnea   . Hyperlipidemia    . COPD (chronic obstructive pulmonary disease) ([place])   . Asthma   . Hypothyroid   . Morbid obesity ([place])   . OSA (obstructive sleep apnea)   . RLS (restless legs syndrome)   . GERD (gastroesophageal reflux disease)   . Bipolar depression ([place])   . Vitamin D deficiency   . Urinary incontinence   . Hypoxemia   . Liver lesion     Favored to be benign on CT; MRI of abd w/contract in Aug 2016. Liver Biopsy-Negative, March 2016  . Acute renal failure ([place])   . Hypomagnesemia   . Hypertension   . Headache   . Diabetes mellitus without complication (HCC)     Type 2, insulin dependent. Patient also takes Metformin.  . CHF (congestive heart failure) ([place])   . Diabetic neuropathy ([place])   . Pinched nerve     Back    Patient Active Problem List   Diagnosis Date Noted  . Diarrhea 08/18/2015  . Low back pain with left-sided sciatica 08/18/2015  . Skin infection 08/18/2015  . Encounter for monitoring tricyclic antidepressant therapy 05/13/2015  . Bipolar disorder ([place]) 05/11/2015  . Liver disease 05/11/2015  . Diabetes ([place]) 05/11/2015  . Apnea, sleep 05/05/2015  . Heart valve disease 05/05/2015  . Urinary tract infection 03/24/2015  . Chronic kidney disease, stage III (moderate) 03/15/2015  . Anemia 03/15/2015  . Migraine 03/12/2015  . COPD (chronic obstructive pulmonary disease) ([place]) 01/20/2015  . 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  [place]) 01/20/2015  . GERD (gastroesophageal reflux disease) 01/20/2015  . Disorder of smooth muscle 01/20/2015  . Hepatic encephalopathy ([place]) 01/20/2015  . Hypothyroid   . RLS (restless legs syndrome)   . Vitamin D deficiency   . Urinary incontinence   . Liver lesion   . Acute renal failure ([place])   . Hypomagnesemia   . Obstructive sleep apnea 10/20/2013  . PERIPHERAL NEUROPATHY 03/04/2009  . HEMORRHOIDS, EXTERNAL 09/03/2008  . ALLERGIC RHINITIS 07/04/2007  . ASTHMA, PERSISTENT, MODERATE 07/04/2007  . INTERSTITIAL CYSTITIS 07/04/2007  .  HYPERTRIGLYCERIDEMIA 07/03/2007  . FATIGUE, CHRONIC 07/03/2007  . HYPERTENSION, BENIGN ESSENTIAL 06/19/2007    Past Surgical History  Procedure Laterality Date  . Abdominal hysterectomy    . Tubal ligation    . Liver biopsy  March 2016    Negative  . Abdominal hysterectomy      Partial  . Cholecystectomy    . Bladder surgery      x 2  . Lung biopsy      Current Outpatient Rx  Name  Route  Sig  Dispense  Refill  . ACCU-CHEK AVIVA PLUS test strip                 Dispense as written.   Marland Kitchen ACCU-CHEK SOFTCLIX LANCETS lancets               . benzonatate (TESSALON) 100 MG capsule   Oral   Take 1 capsule (100 mg total) by mouth every 8 (eight) hours as needed for cough.   30 capsule   0   . cephALEXin (KEFLEX) 500 MG capsule   Oral   Take 1 capsule (500 mg total) by mouth 2 (two) times daily.   14 capsule   0   . Choline Fenofibrate (FENOFIBRIC ACID) 45 MG CPDR      TAKE 1 CAPSULE BY MOUTH EACH DAY   30 capsule   5   . cyclobenzaprine (FLEXERIL) 10 MG tablet   Oral   Take 1 tablet (10 mg total) by mouth every 8 (eight) hours as needed for muscle spasms.   20 tablet   0   . diazepam (VALIUM) 2 MG tablet   Oral   Take 1 tablet (2 mg total) by mouth every 8 (eight) hours as needed for anxiety.   15 tablet   0   . DULoxetine (CYMBALTA) 60 MG capsule   Oral   Take 120 mg by mouth daily.         . fluticasone (FLONASE) 50 MCG/ACT nasal spray   Each Nare   Place 2 sprays into both nostrils daily.   16 g   2   . ibuprofen (ADVIL,MOTRIN) 800 MG tablet   Oral   Take 1 tablet (800 mg total) by mouth every 8 (eight) hours as needed.   30 tablet   0   . insulin lispro (HUMALOG) 100 UNIT/ML injection   Subcutaneous   Inject into the skin 3 (three) times daily as needed for high blood sugar.         . insulin NPH Human (HUMULIN N,NOVOLIN N) 100 UNIT/ML injection   Subcutaneous   Inject into the skin. 75 units in the am, 50 units in at lunch, 75 units  at night         . lamoTRIgine (LAMICTAL) 150 MG tablet   Oral   Take 150 mg by mouth daily.          Marland Kitchen levothyroxine (SYNTHROID, LEVOTHROID)  75 MCG tablet      TAKE 1 TABLET BY MOUTH EVERY DAY.   30 tablet   11   . Linaclotide (LINZESS) 145 MCG CAPS capsule   Oral   Take 1 capsule (145 mcg total) by mouth daily.   30 capsule   6   . magnesium oxide (MAG-OX) 400 MG tablet   Oral   Take 400 mg by mouth 2 (two) times daily.         . meloxicam (MOBIC) 15 MG tablet   Oral   Take 1 tablet (15 mg total) by mouth daily.   30 tablet   0   . metoprolol tartrate (LOPRESSOR) 25 MG tablet      TAKE 1/2 TABLET BY MOUTH TWICE A DAY   30 tablet   5   . metroNIDAZOLE (FLAGYL) 500 MG tablet   Oral   Take 1 tablet (500 mg total) by mouth 3 (three) times daily.   30 tablet   0   . Multiple Vitamin (MULTIVITAMIN) capsule   Oral   Take by mouth.         . mupirocin cream (BACTROBAN) 2 %   Topical   Apply 1 application topically 2 (two) times daily.   15 g   0   . oxyCODONE-acetaminophen (ROXICET) 5-325 MG tablet   Oral   Take 1 tablet by mouth every 6 (six) hours as needed for severe pain.   20 tablet   0   . pantoprazole (PROTONIX) 40 MG tablet   Oral   Take 1 tablet (40 mg total) by mouth daily.   31 tablet   12   . QUEtiapine (SEROQUEL) 25 MG tablet   Oral   Take 25 mg by mouth every morning. Take one in am, one at lunch         . QUEtiapine (SEROQUEL) 400 MG tablet   Oral   Take 800 mg by mouth at bedtime. Take 2 tabs at bedtime         . REXULTI 1 MG TABS   Oral   Take 1 mg by mouth.           Dispense as written.   . topiramate (TOPAMAX) 25 MG tablet   Oral   Take 1 tablet (25 mg total) by mouth 2 (two) times daily.   120 tablet   3   . zolpidem (AMBIEN) 10 MG tablet   Oral   Take 10 mg by mouth.           Allergies Morphine and related; Codeine; Codeine; Hydrocodone-acetaminophen; Mirtazapine; Morphine and related;  Sulfamethoxazole-trimethoprim; and Vicodin  Family History  Problem Relation Age of Onset  . Heart disease Maternal Grandfather   . Rheum arthritis Maternal Grandfather   . Cancer Maternal Grandfather     liver  . Cancer Paternal Grandmother     lung  . Diabetes Mother   . Heart disease Mother   . Hyperlipidemia Mother   . Hypertension Mother   . Kidney disease Mother   . Mental illness Mother   . Hypothyroidism Mother   . Stroke Mother   . Osteoporosis Mother   . Glaucoma Mother   . Hypertension Father   . Asthma Daughter   . Cancer Daughter     throat    Social History Social History  Substance Use Topics  . Smoking status: Never Smoker   . Smokeless tobacco: Never Used  . Alcohol Use: No    Review  of Systems  Constitutional: Negative for fever. Eyes: Negative for visual changes. ENT: Negative for sore throat. Cardiovascular: Negative for chest pain. Respiratory: Negative for shortness of breath. Gastrointestinal: No abdominal pain. Genitourinary: Negative for dysuria. Musculoskeletal: Positive for back pain as per history of present illness. Skin: Negative for rash. Neurological: Negative for headache. 10 point Review of Systems otherwise negative ____________________________________________   PHYSICAL EXAM:  VITAL SIGNS: ED Triage Vitals  Enc Vitals Group     BP 09/10/15 1121 119/62 mmHg     Pulse Rate 09/10/15 1121 93     Resp 09/10/15 1121 18     Temp 09/10/15 1121 98 F (36.7 C)     Temp Source 09/10/15 1121 Oral     SpO2 09/10/15 1121 94 %     Weight 09/10/15 1121 186 lb (84.369 kg)     Height 09/10/15 1121 4' 11"  (1.499 m)     Head Cir --      Peak Flow --      Pain Score 09/10/15 1128 8     Pain Loc --      Pain Edu? --      Excl. in [place]? --      Constitutional: Alert and oriented. Well appearing and in no distress. Eyes: Conjunctivae are normal. PERRL. Normal extraocular movements. ENT   Head: Normocephalic and atraumatic.    Nose: No congestion/rhinnorhea.   Mouth/Throat: Mucous membranes are moist.   Neck: No stridor. Cardiovascular/Chest: Normal rate, regular rhythm.  No murmurs, rubs, or gallops. Respiratory: Normal respiratory effort without tachypnea nor retractions. Breath sounds are clear and equal bilaterally. No wheezes/rales/rhonchi. Gastrointestinal: Soft. No distention, no guarding, no rebound. Nontender.  Morbidly obese.  Genitourinary/rectal:Deferred Musculoskeletal: Back normal in appearance. Tender paraspinous musculature into the buttocks bilateral low back. No skin rashes there. No midline point tenderness over the spinal prominences. She does have pain in her low back with leg raise on both sides. Neurologic:  Normal speech and language. No weakness appreciated, she does have pain when doing leg raise, but no real weakness. Gait is somewhat slow, but otherwise normal appearance. No sensory loss. Skin:  Skin is warm, dry and intact. No rash noted. Psychiatric: Mood and affect are normal. Speech and behavior are normal. Patient exhibits appropriate insight and judgment.  ____________________________________________   EKG I, Lisa Roca, MD, the attending physician have personally viewed and interpreted all ECGs.  None ____________________________________________  LABS (pertinent positives/negatives)  Lipase 24 Comprehensive metabolic panel significant for BUN 43 and creatinine 1.25, potassium 5.2 AST 91, a LT 118, alkaline phosphatase 166 White blood count 8.7, hemoglobin 10.8 and platelet count 273 Urinalysis trace leukocytes, 6-30 white blood cells and few bacteria. Trace ketones  ____________________________________________  RADIOLOGY All Xrays were viewed by me. Imaging interpreted by Radiologist.  None __________________________________________  PROCEDURES  Procedure(s) performed: None  Critical Care performed:  None  ____________________________________________   ED COURSE / ASSESSMENT AND PLAN  CONSULTATIONS: Dr,. Allen Norris, Gastroenterology  Pertinent labs & imaging results that were available during my care of the patient were reviewed by me and considered in my medical decision making (see chart for details).  Patient's symptoms of low back pain seemed consistent with chronic low back pain with right-sided sciatica exacerbation. Patient was requesting oxycodone, and I did discuss with her that the emergency department policy is not to refill/treat chronic pain. We discussed conservative management with anti-inflammatories and muscle relaxers, and she seems comfortable continuing this approach.  Although nurse noted that the patient  has had stool incontinence, the patient has not really had stool incontinence. She has had diarrhea and trouble getting to the bathroom in time. I am not suspicious of cauda equina or spinal emergency.  Laboratory evaluation was obtained by protocol prior to my seeing the patient. She was found to have mild acute on chronic renal failure, cr 1.25 today (has been 1.18-1.45) and as she is able to orally rehydrate, she was instructed to drink plenty of fluids as well as a dose of Kayexalate for the minimally elevated potassium.  She is to follow up with her primary doctor next week and likely get repeat labs.  Her LFTs were elevated, higher than they have in the past, although she does give a history of a "liver lesion "that she saw Dr.Wohl in the past one had a liver biopsy. Upon review of his chart note, the patient has had what was felt to be drug-induced hepatitis that resolved, and had fatty liver on her biopsy.  I spoke with Dr. Allen Norris, who indicated that NSAIDs may have been the source of her minimally elevated liver enzymes, more likely than fatty liver as she had had returned normal in the past. However he stated that continued treatment for 1-2 weeks with NSAIDs for her  acute on chronic back pain with sciatica was not unreasonable or contraindicated.  He recommended repeat liver enzymes in 1 month.  Her urinalysis was consistent with likely urinary tract infection, and patient was treated with a prescription of Keflex. Urine culture was sent.   Patient / Family / Caregiver informed of clinical course, medical decision-making process, and agree with plan.   I discussed return precautions, follow-up instructions, and discharged instructions with patient and/or family.  ___________________________________________   FINAL CLINICAL IMPRESSION(S) / ED DIAGNOSES   Final diagnoses:  Chronic bilateral low back pain with right-sided sciatica  Hyperkalemia  Acute on chronic kidney failure (HCC)  Abnormal liver enzymes        Lisa Roca, MD 09/10/15 1545

## 2015-09-10 NOTE — Discharge Instructions (Signed)
You were evaluated for chronic low back pain, and we discussed policy in the emergency department not prescribe/refill narcotic pain medication.  We discussed, treat inflammation based pain with anti-inflammatory Motrin and muscle relaxer flexaril.  We discussed you should continue to concentrate on losing weight as this may ease some of your pain. We also discussed possibly trying a four-week trial of a gluten-fee (no breads or any food items with gluten) and dairy-free diet, which in some people have eased inflammation and thereby chronic pain.  Return to the emergency department for any worsening condition including new weakness or numbness, incontinence of stools or urine, fever, abdominal pain, or any other symptoms concerning to you.  Follow-up with your primary care doctor next week as scheduled, and the spine surgeon at the end of this month as scheduled.     Chronic Back Pain  When back pain lasts longer than 3 months, it is called chronic back pain.People with chronic back pain often go through certain periods that are more intense (flare-ups).  CAUSES Chronic back pain can be caused by wear and tear (degeneration) on different structures in your back. These structures include:  The bones of your spine (vertebrae) and the joints surrounding your spinal cord and nerve roots (facets).  The strong, fibrous tissues that connect your vertebrae (ligaments). Degeneration of these structures may result in pressure on your nerves. This can lead to constant pain. HOME CARE INSTRUCTIONS  Avoid bending, heavy lifting, prolonged sitting, and activities which make the problem worse.  Take brief periods of rest throughout the day to reduce your pain. Lying down or standing usually is better than sitting while you are resting.  Take over-the-counter or prescription medicines only as directed by your caregiver. SEEK IMMEDIATE MEDICAL CARE IF:   You have weakness or numbness in one of your legs  or feet.  You have trouble controlling your bladder or bowels.  You have nausea, vomiting, abdominal pain, shortness of breath, or fainting.   This information is not intended to replace advice given to you by your health care provider. Make sure you discuss any questions you have with your health care provider.   Document Released: 09/28/2004 Document Revised: 11/13/2011 Document Reviewed: 02/08/2015 Elsevier Interactive Patient Education [employer].

## 2015-09-10 NOTE — ED Notes (Addendum)
Ems pt to lobby , right lateral back pain radiating around to right groin with nausea,  Incontinence with stool, denies any urinary symptoms, increased difficulty with ambulation

## 2015-09-13 ENCOUNTER — Ambulatory Visit: Payer: Medicare Other | Admitting: Physical Therapy

## 2015-09-13 LAB — URINE CULTURE

## 2015-09-13 IMAGING — CT CT ARTHROGRAM SHOULDER W/O CM*L*
3 series · 13 of 20 positions shown, 15 images · non-contrast
Comparison: INJECTION IMAGES DATED 02/09/2014 AND RADIOGRAPHS DATED
01/16/2014

CLINICAL DATA: Left shoulder pain.

EXAM:
CT ARTHROGRAPHY OF THE LEFT SHOULDER
TECHNIQUE: Multidetector CT imaging was performed following the standard
protocol after injection of dilute contrast into the joint.

[Series 2: shoulder osteo · axial · 0.46mm/px · z∈[-424,-331]mm · 6 of 88 slices shown, 8 images]
[im 13/88  soft-tissue]
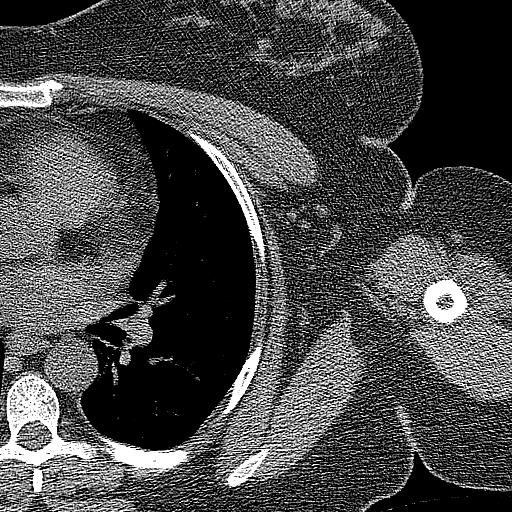
[im 13/88  bone]
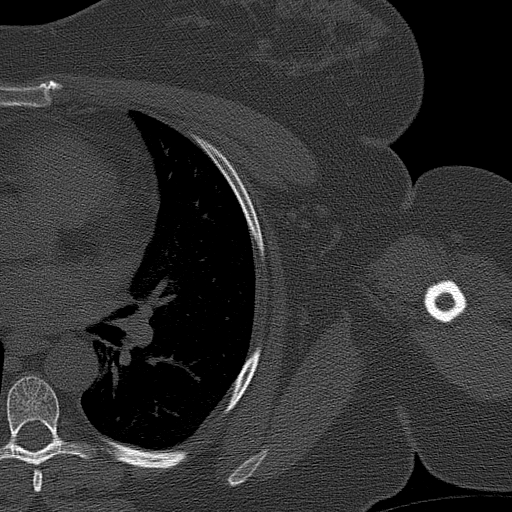
[im 25/88  bone]
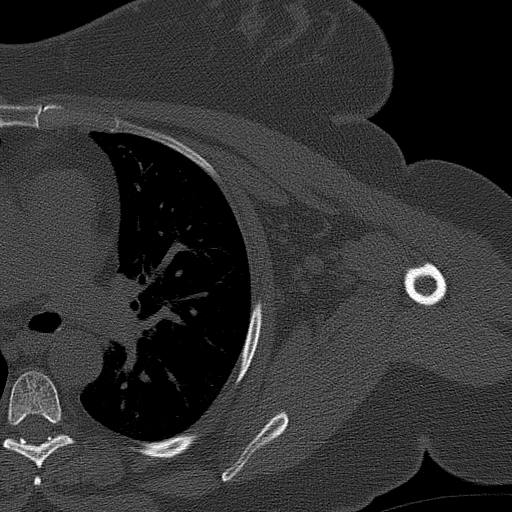
[im 38/88  bone]
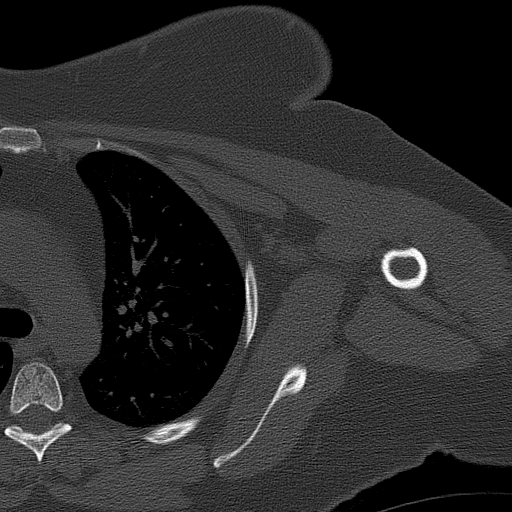
[im 50/88  bone]
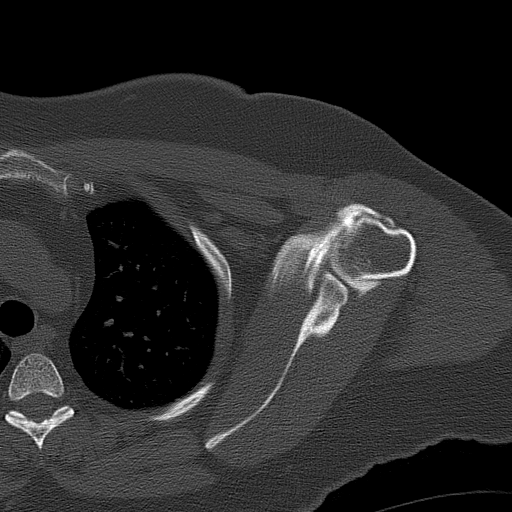
[im 63/88  soft-tissue]
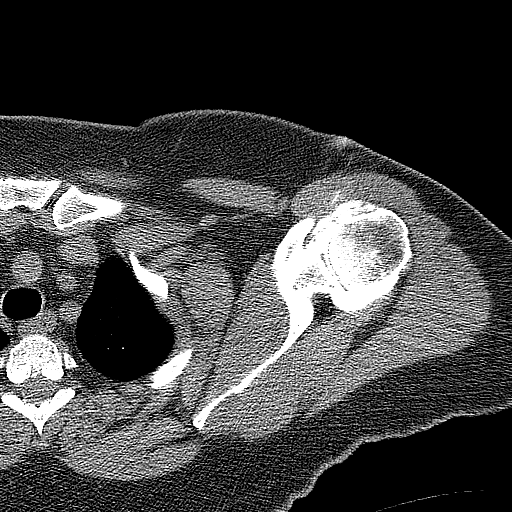
[im 63/88  bone]
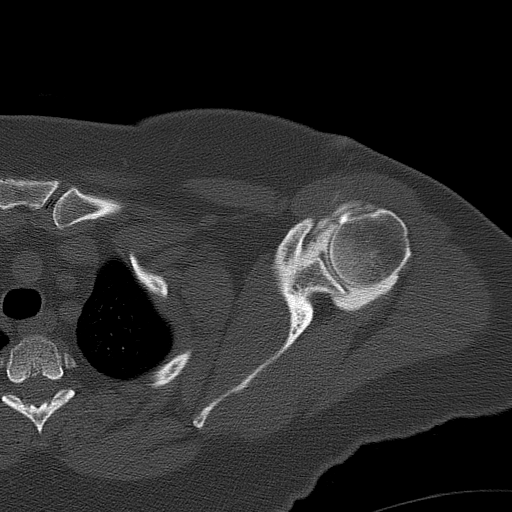
[im 75/88  bone]
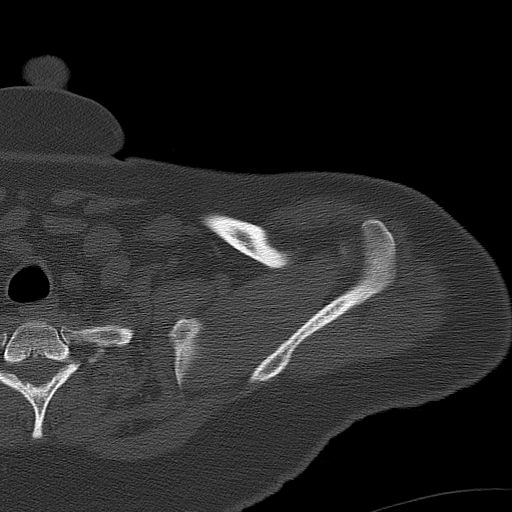

[Series 607: sagittal soft tissue · coronal · 0.46mm/px · 3 of 55 slices shown]
[im 11/55  bone]
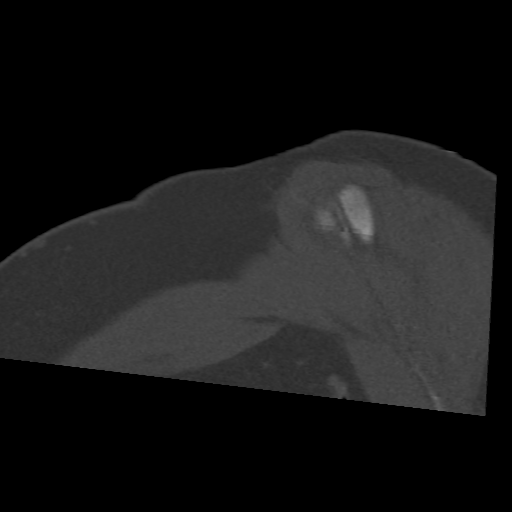
[im 22/55  bone]
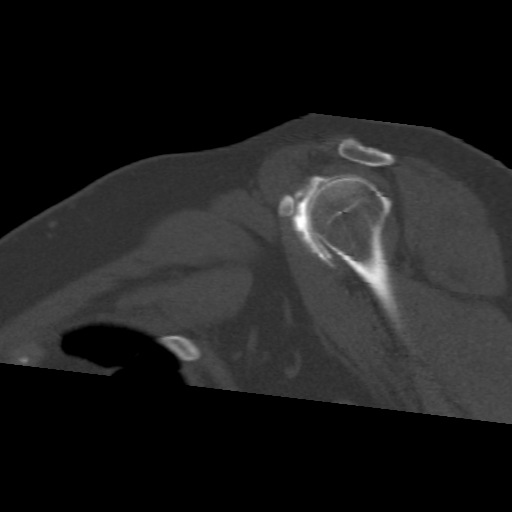
[im 33/55  bone]
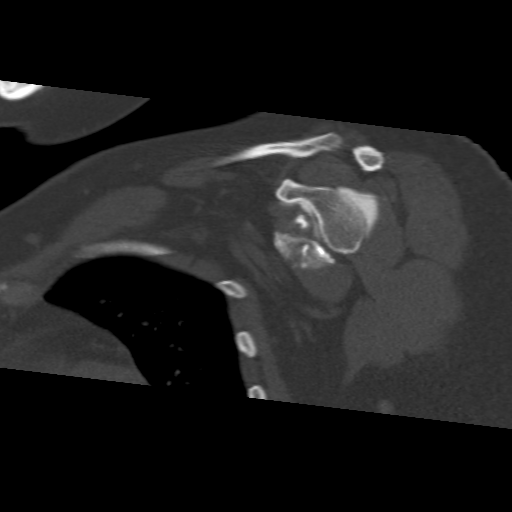

[Series 609: axial soft tissue · axial · 0.46mm/px · z∈[-490,-429]mm · 4 of 61 slices shown]
[im 13/61  soft-tissue]
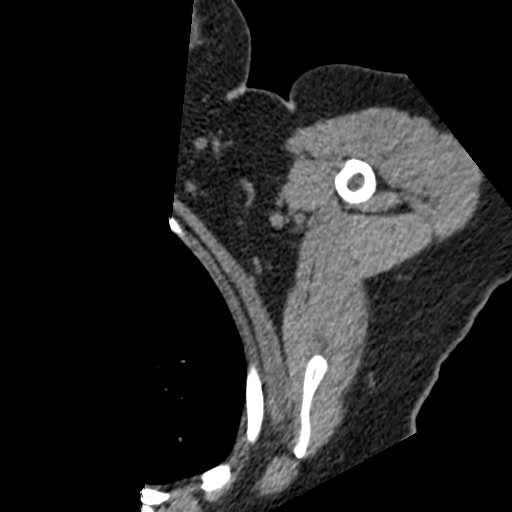
[im 25/61  soft-tissue]
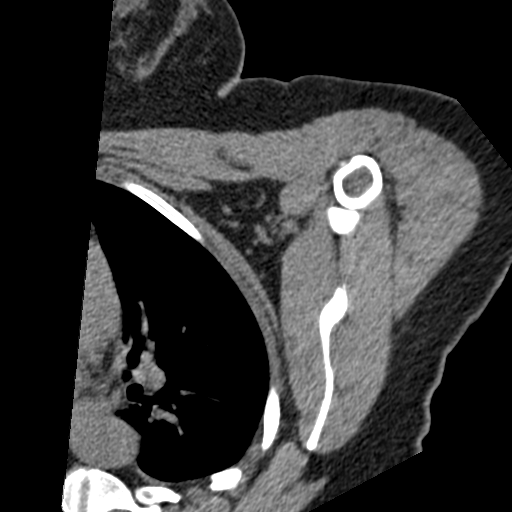
[im 37/61  soft-tissue]
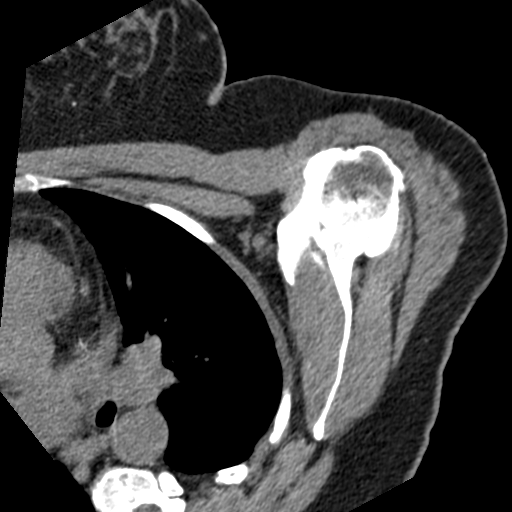
[im 49/61  soft-tissue]
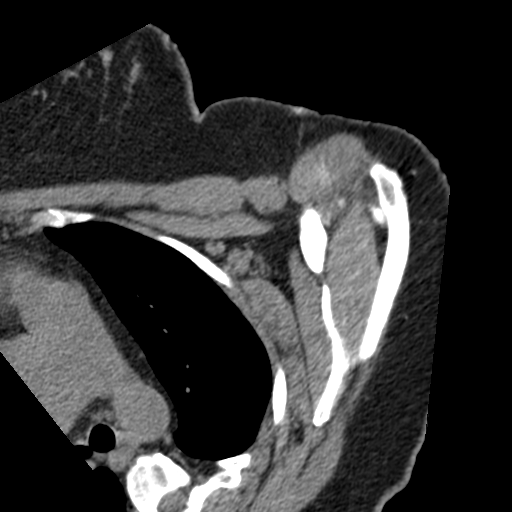

[13 of 20 positions shown; findings below may reference images not displayed]

FINDINGS: Rotator cuff: Intact. There is some iatrogenic contrast in the
subscapularis but this is not felt to represent a tear.

Muscles:  No atrophy.

Acromioclavicular Joint:  Normal.  Type 1 acromion.

Glenohumeral joint: No arthritis or other abnormality. No loose
bodies.

Labrum:  The labrum is well visualized and is intact.

Biceps tendon: Properly located and intact.

Bones:  Normal.
IMPRESSION: Normal CT arthrogram of the left shoulder.

## 2015-09-13 IMAGING — RF DG ARTHROGRAM INJ W/FG MRI/CT SHOULDER*L*
1 series · 1 of 1 positions shown · non-contrast
Comparison: None.

CLINICAL DATA: Left shoulder pain for the past year and a half.
Spinal implant. Post cortisone injections with no relief.

EXAM:
DG ARTHROGRAM INJ W/FG MRI/CT SHOULDER LEFT
FLUOROSCOPY TIME:  56 seconds.

[Series 2: fluoro_chest_bw · 0.10mm/px · 1 of 1 slices shown]
[im 1/1]
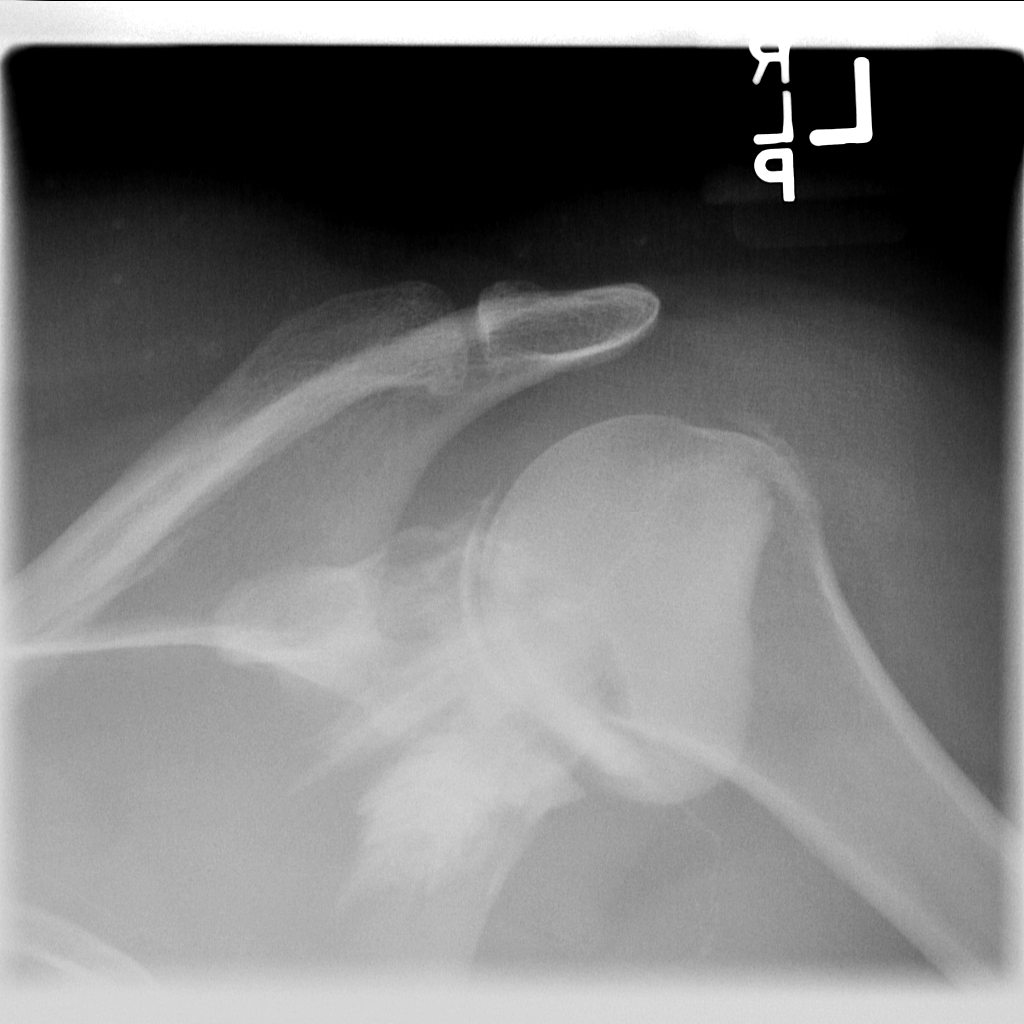

[1 of 1 positions shown; findings below may reference images not displayed]

FINDINGS: The procedure and associated risks were discussed with the patient
(including but not limited to infection, bleeding, inadequate
injection, contrast/drug reaction). Written as well as oral
witnessed consent was obtained. Tray established by Radiology
technologist.

Time-out performed.

Under fluoroscopic guidance and aseptic technique, a 22 gauge spinal
needle was advanced into the left shoulder joint. 12 cc of mixture
of 50% Isovue 300/ saline was instilled. During the last 2 cc of
instilled contrast, extension of contrast into sub scapularis muscle
was noted and injection terminated. Patient transported to CT suite.

Postprocedure instructions review with the patient in detail.
IMPRESSION: Left shoulder injection prior to CT performed as noted above.

## 2015-09-14 ENCOUNTER — Ambulatory Visit: Payer: Self-pay | Admitting: Family Medicine

## 2015-09-15 ENCOUNTER — Ambulatory Visit: Payer: Medicare Other | Admitting: Physical Therapy

## 2015-09-20 DIAGNOSIS — R0902 Hypoxemia: Secondary | ICD-10-CM | POA: Diagnosis not present

## 2015-09-20 DIAGNOSIS — G4733 Obstructive sleep apnea (adult) (pediatric): Secondary | ICD-10-CM | POA: Diagnosis not present

## 2015-09-20 DIAGNOSIS — J449 Chronic obstructive pulmonary disease, unspecified: Secondary | ICD-10-CM | POA: Diagnosis not present

## 2015-09-22 ENCOUNTER — Ambulatory Visit (INDEPENDENT_AMBULATORY_CARE_PROVIDER_SITE_OTHER): Payer: Medicare Other | Admitting: Family Medicine

## 2015-09-22 ENCOUNTER — Encounter: Payer: Self-pay | Admitting: Family Medicine

## 2015-09-22 VITALS — BP 137/82 | HR 83 | Temp 98.2°F | Wt 187.0 lb

## 2015-09-22 DIAGNOSIS — E781 Pure hyperglyceridemia: Secondary | ICD-10-CM

## 2015-09-22 DIAGNOSIS — R748 Abnormal levels of other serum enzymes: Secondary | ICD-10-CM | POA: Diagnosis not present

## 2015-09-22 DIAGNOSIS — D649 Anemia, unspecified: Secondary | ICD-10-CM | POA: Diagnosis not present

## 2015-09-22 DIAGNOSIS — E038 Other specified hypothyroidism: Secondary | ICD-10-CM | POA: Diagnosis not present

## 2015-09-22 DIAGNOSIS — E1165 Type 2 diabetes mellitus with hyperglycemia: Secondary | ICD-10-CM

## 2015-09-22 DIAGNOSIS — R32 Unspecified urinary incontinence: Secondary | ICD-10-CM | POA: Insufficient documentation

## 2015-09-22 DIAGNOSIS — N3000 Acute cystitis without hematuria: Secondary | ICD-10-CM | POA: Diagnosis not present

## 2015-09-22 DIAGNOSIS — Z794 Long term (current) use of insulin: Secondary | ICD-10-CM

## 2015-09-22 DIAGNOSIS — E559 Vitamin D deficiency, unspecified: Secondary | ICD-10-CM | POA: Diagnosis not present

## 2015-09-22 MED ORDER — CYCLOBENZAPRINE HCL 10 MG PO TABS: 10.0000 mg | ORAL_TABLET | Freq: Every evening | ORAL | Status: DC | PRN

## 2015-09-22 NOTE — Patient Instructions (Addendum)
Try to limit saturated fats in your diet (bologna, hot dogs, barbeque, cheeseburgers, hamburgers, steak, bacon, sausage, cheese, etc.) and get more fresh fruits, vegetables, and whole grains Please do call your gastroenterologist, Dr. Allen Norris, to make an appointment with him sometime soon Check out the information at [email or web address] entitled "What It Takes to Lose Weight" Try to lose between 1-2 pounds per week by taking in fewer calories and burning off more calories You can succeed by limiting portions, limiting foods dense in calories and fat, becoming more active, and drinking 8 glasses of water a day (64 ounces) Don't skip meals, especially breakfast, as skipping meals may alter your metabolism Do not use over-the-counter weight loss pills or gimmicks that claim rapid weight loss A healthy BMI (or body mass index) is between 18.5 and 24.9 You can calculate your ideal BMI at the [place] website [employer]fr We'll call you about the urology appointment and the labs done today Please return the stool cards at your convenience Avoid non-steroidals (which include Aleve, ibuprofen, Advil, Motrin, and naproxen); non-steroidals can cause long-term kidney damage     Cholesterol Cholesterol is a fat. Your body needs a small amount of cholesterol. Cholesterol may build up in your blood vessels. This increases your chance of having a heart attack or stroke. You cannot feel your cholesterol levels. The only way to know your cholesterol level is high is with a blood test. Keep your test results. Work with your doctor to keep your cholesterol at a good level. WHAT DO THE TEST RESULTS MEAN?  Total cholesterol is how much cholesterol is in your blood.  LDL is bad cholesterol. This is the type that can build up. You want LDL to be low.  HDL is good cholesterol. It cleans your blood vessels and carries LDL away. You want HDL to be high.  Triglycerides are  fat that the body can burn for energy or store. WHAT ARE GOOD LEVELS OF CHOLESTEROL?  Total cholesterol below 200.  LDL below 100 for people at risk. Below 70 for those at very high risk.  HDL above 50 is good. Above 60 is best.  Triglycerides below 150. HOW CAN I LOWER MY CHOLESTEROL?  Diet. Follow your diet programs as told by your doctor.  Choose fish, white meat chicken, roasted [place], or baked [place]. Try not to eat red meat, fried foods, or processed meats such as sausage and lunch meats.  Eat lots of fresh fruits and vegetables.  Choose whole grains, beans, pasta, potatoes, and cereals.  Use only small amounts of olive, corn, or canola oils.  Try not to eat butter, mayonnaise, shortening, or palm kernel oils.  Try not to eat foods with trans fats.  Drink skim or nonfat milk. Eat low-fat or nonfat yogurt and cheeses. Try not to drink whole milk or cream. Try not to eat ice cream, egg yolks, and full-fat cheeses.  Healthy desserts include angel food cake, ginger snaps, animal crackers, hard candy, popsicles, and low-fat or nonfat frozen yogurt. Try not to eat pastries, cakes, pies, and cookies.  Exercise. Follow your exercise programs as told by your doctor.  Be more active. You can try gardening, walking, or taking the stairs. Ask your doctor about how you can be more active.  Medicine. Take medicine as told by your doctor.   This information is not intended to replace advice given to you by your health care provider. Make sure you discuss any questions you have with your health care provider.  Document Released: 11/17/2008 Document Revised: 09/11/2014 Document Reviewed: 06/04/2013 Elsevier Interactive Patient Education [employer].

## 2015-09-22 NOTE — Assessment & Plan Note (Signed)
Recheck urine today; finished antibiotics today

## 2015-09-22 NOTE — Assessment & Plan Note (Signed)
Will get A1c today; foot exam by MD today

## 2015-09-22 NOTE — Assessment & Plan Note (Signed)
Check lipids today; continue fibric acid derivative

## 2015-09-22 NOTE — Assessment & Plan Note (Signed)
Reviewed recent labs; stool cards, labs today

## 2015-09-22 NOTE — Assessment & Plan Note (Signed)
Check level, given her fatigue, will supplement if indicated

## 2015-09-22 NOTE — Progress Notes (Signed)
BP 137/82 mmHg  Pulse 83  Temp(Src) 98.2 F (36.8 C)  Wt 187 lb (84.823 kg)  SpO2 97%   Subjective:    Patient ID: [name], female    DOB: 10-06-1965, 50 y.o.   MRN: [id number]  HPI: [name] is a 50 y.o. female  Chief Complaint  Patient presents with  . Diabetes    routine follow up and labs  . Hypertension    routine follow up  . Hyperlipidemia    routine follow up  . Hypothyroidism  . Follow-up    She states the hospital said she needed her potassium, kidneys, liver enzymes and urine rechecked  . Orders    Patient will call and schedule mammogram   She went to the ER for her back; they put her on flexeril and that really helped her  She had a urinary tract infection and is taking her last day of antibiotics today  She had high liver enzymes; the ER called her GI doctor to find out if okay to take Motrin 800 mg and she took one pill and it tore her stomach up and she threw up; she does not have f/u with GI right now; her liver enzymes were high and she has some RUQ discomfort  Her K+ level was off, and she had to watch it or it could stop her heart; she was in acute renal failure; they did not give her fluids in the ER, but they did not give her any fluids; potassium was barely elevated  Liver enzymes were up, alk phos 16  We reviewed her medicines She is not taking meloxicam; rxd on 09/02/15 She is taking ambien and Rexulti and seroquel from her psychiatrist protonix is from GI  She is not having any urinary burning; does need bladder stimulator checked and needs new urologist  Relevant past medical, surgical, family and social history reviewed and updated as indicated Family History  Problem Relation Age of Onset  . Heart disease Maternal Grandfather   . Rheum arthritis Maternal Grandfather   . Cancer Maternal Grandfather     liver  . Cancer Paternal Grandmother     lung  . Diabetes Mother   . Heart disease Mother   . Hyperlipidemia Mother   .  Hypertension Mother   . Kidney disease Mother   . Mental illness Mother   . Hypothyroidism Mother   . Stroke Mother   . Osteoporosis Mother   . Glaucoma Mother   . Hypertension Father   . Asthma Daughter   . Cancer Daughter     throat   Kidney disease in the family; mother on dialysis, brother  Interim medical history since our last visit reviewed. Allergies and medications reviewed and updated.  Review of Systems Per HPI unless specifically indicated above     Objective:    BP 137/82 mmHg  Pulse 83  Temp(Src) 98.2 F (36.8 C)  Wt 187 lb (84.823 kg)  SpO2 97%  Wt Readings from Last 3 Encounters:  09/22/15 187 lb (84.823 kg)  09/10/15 186 lb (84.369 kg)  09/02/15 186 lb (84.369 kg)   body mass index is 37.75 kg/(m^2).  Physical Exam  Constitutional: She appears well-developed and well-nourished. No distress.  Obese, weight stable  HENT:  Head: Normocephalic and atraumatic.  Eyes: EOM are normal. No scleral icterus.  Neck: No thyromegaly present.  Cardiovascular: Normal rate and regular rhythm.  Exam reveals gallop.   No murmur heard. Pulmonary/Chest: Effort normal and  breath sounds normal. No respiratory distress. She has no wheezes.  Abdominal: Soft. Bowel sounds are normal. She exhibits no distension.  Musculoskeletal: Normal range of motion. She exhibits no edema.  Neurological: She is alert. She exhibits normal muscle tone.  Skin: Skin is warm and dry. She is not diaphoretic. No pallor.  Psychiatric: She has a normal mood and affect. Her behavior is normal. Judgment and thought content normal.      Assessment & Plan:   Problem List Items Addressed This Visit      Endocrine   Hypothyroid    Check TSH and adjust medicine if needed, given her fatigue      Relevant Orders   TSH (Completed)   Diabetes ([place]) - Primary    Will get A1c today; foot exam by MD today      Relevant Orders   Hgb A1c w/o eAG (Completed)     Genitourinary   Urinary tract  infection    Recheck urine today; finished antibiotics today      Relevant Orders   UA/M w/rflx Culture, Routine (Completed)     Other   HYPERTRIGLYCERIDEMIA    Check lipids today; continue fibric acid derivative      Relevant Orders   Lipid Panel w/o Chol/HDL Ratio (Completed)   Vitamin D deficiency    Check level, given her fatigue, will supplement if indicated      Relevant Orders   VITAMIN D 25 Hydroxy (Vit-D Deficiency, Fractures) (Completed)   Hypomagnesemia    Check level      Relevant Orders   Magnesium (Completed)   Anemia    Reviewed recent labs; stool cards, labs today      Relevant Orders   CBC with Differential/Platelet (Completed)   Ferritin (Completed)   Elevated alkaline phosphatase level   Relevant Orders   Comprehensive metabolic panel (Completed)   Urinary bladder incontinence   Relevant Orders   Ambulatory referral to Urology      Follow up plan: Return in about 3 months (around 12/21/2015) for thirty minute follow-up with fasting labs.  Orders Placed This Encounter  Procedures  . Microscopic Examination  . UA/M w/rflx Culture, Routine  . TSH  . Comprehensive metabolic panel  . Lipid Panel w/o Chol/HDL Ratio  . Hgb A1c w/o eAG  . VITAMIN D 25 Hydroxy (Vit-D Deficiency, Fractures)  . CBC with Differential/Platelet  . Ferritin  . Magnesium  . Urine Culture, Routine  . Ambulatory referral to Urology   An after-visit summary was printed and given to the patient at [facility].  Please see the patient instructions which may contain other information and recommendations beyond what is mentioned above in the assessment and plan.

## 2015-09-22 NOTE — Assessment & Plan Note (Signed)
Check TSH and adjust medicine if needed, given her fatigue

## 2015-09-22 NOTE — Assessment & Plan Note (Signed)
Check level 

## 2015-09-23 LAB — COMPREHENSIVE METABOLIC PANEL
ALT: 36 IU/L — AB (ref 0–32)
AST: 28 IU/L (ref 0–40)
Albumin/Globulin Ratio: 2 (ref 1.1–2.5)
Albumin: 4.3 g/dL (ref 3.5–5.5)
Alkaline Phosphatase: 96 IU/L (ref 39–117)
BUN/Creatinine Ratio: 13 (ref 9–23)
BUN: 12 mg/dL (ref 6–24)
Bilirubin Total: <0.2 mg/dL (ref 0.0–1.2)
CALCIUM: 10.3 mg/dL — AB (ref 8.7–10.2)
CO2: 26 mmol/L (ref 18–29)
Chloride: 103 mmol/L (ref 96–106)
Creatinine, Ser: 0.9 mg/dL (ref 0.57–1.00)
GFR, EST AFRICAN AMERICAN: 86 mL/min/{1.73_m2} (ref >59)
GFR, EST NON AFRICAN AMERICAN: 74 mL/min/{1.73_m2} (ref >59)
GLUCOSE: 78 mg/dL (ref 65–99)
Globulin, Total: 2.2 g/dL (ref 1.5–4.5)
Potassium: 4.3 mmol/L (ref 3.5–5.2)
Sodium: 145 mmol/L — ABNORMAL HIGH (ref 134–144)
TOTAL PROTEIN: 6.5 g/dL (ref 6.0–8.5)

## 2015-09-23 LAB — CBC WITH DIFFERENTIAL/PLATELET
Basophils Absolute: 0.1 10*3/uL (ref 0.0–0.2)
Basos: 1 %
EOS (ABSOLUTE): 0.2 10*3/uL (ref 0.0–0.4)
Eos: 3 %
Hematocrit: 34.8 % (ref 34.0–46.6)
Hemoglobin: 11.2 g/dL (ref 11.1–15.9)
Immature Grans (Abs): 0.3 10*3/uL — ABNORMAL HIGH (ref 0.0–0.1)
Immature Granulocytes: 3 %
Lymphocytes Absolute: 3.1 10*3/uL (ref 0.7–3.1)
Lymphs: 38 %
MCH: 28.8 pg (ref 26.6–33.0)
MCHC: 32.2 g/dL (ref 31.5–35.7)
MCV: 90 fL (ref 79–97)
Monocytes Absolute: 0.6 10*3/uL (ref 0.1–0.9)
Monocytes: 8 %
Neutrophils Absolute: 3.9 10*3/uL (ref 1.4–7.0)
Neutrophils: 47 %
Platelets: 337 10*3/uL (ref 150–379)
RBC: 3.89 x10E6/uL (ref 3.77–5.28)
RDW: 14.7 % (ref 12.3–15.4)
WBC: 8.1 10*3/uL (ref 3.4–10.8)

## 2015-09-23 LAB — LIPID PANEL W/O CHOL/HDL RATIO
Cholesterol, Total: 196 mg/dL (ref 100–199)
HDL: 35 mg/dL — ABNORMAL LOW (ref >39)
LDL Calculated: 99 mg/dL (ref 0–99)
Triglycerides: 309 mg/dL — ABNORMAL HIGH (ref 0–149)
VLDL Cholesterol Cal: 62 mg/dL — ABNORMAL HIGH (ref 5–40)

## 2015-09-23 LAB — MAGNESIUM: Magnesium: 1.4 mg/dL — ABNORMAL LOW (ref 1.6–2.3)

## 2015-09-23 LAB — HGB A1C W/O EAG: Hgb A1c MFr Bld: 9.9 % — ABNORMAL HIGH (ref 4.8–5.6)

## 2015-09-23 LAB — VITAMIN D 25 HYDROXY (VIT D DEFICIENCY, FRACTURES): Vit D, 25-Hydroxy: 28.1 ng/mL — ABNORMAL LOW (ref 30.0–100.0)

## 2015-09-23 LAB — FERRITIN: Ferritin: 82 ng/mL (ref 15–150)

## 2015-09-23 LAB — TSH: TSH: 0.723 u[IU]/mL (ref 0.450–4.500)

## 2015-09-24 LAB — MICROSCOPIC EXAMINATION: RBC MICROSCOPIC, UA: NONE SEEN /HPF (ref 0–?)

## 2015-09-24 LAB — UA/M W/RFLX CULTURE, ROUTINE
Bilirubin, UA: NEGATIVE
GLUCOSE, UA: NEGATIVE
Ketones, UA: NEGATIVE
NITRITE UA: NEGATIVE
PROTEIN UA: NEGATIVE
RBC UA: NEGATIVE
SPEC GRAV UA: 1.015 (ref 1.005–1.030)
UUROB: 0.2 mg/dL (ref 0.2–1.0)
pH, UA: 8 — ABNORMAL HIGH (ref 5.0–7.5)

## 2015-09-24 LAB — URINE CULTURE, REFLEX: Organism ID, Bacteria: NO GROWTH

## 2015-09-27 ENCOUNTER — Telehealth: Payer: Self-pay | Admitting: Family Medicine

## 2015-09-27 DIAGNOSIS — Z794 Long term (current) use of insulin: Principal | ICD-10-CM

## 2015-09-27 DIAGNOSIS — E1165 Type 2 diabetes mellitus with hyperglycemia: Secondary | ICD-10-CM

## 2015-09-27 NOTE — Telephone Encounter (Signed)
Patient notified of lab results. She states she was seeing Dr. Chalmers Cater in [place] for her diabetes but she has not seen her in over a year. Insulin directions that are in the chart are correct. She states she needs to see an endocrinologist here locally. Do you want to make any changes to her insulins?

## 2015-09-27 NOTE — Assessment & Plan Note (Signed)
Uncontrolled, refer to local endo

## 2015-09-27 NOTE — Telephone Encounter (Signed)
Let patient know that her magnesium is low again; increase magnesium from twice a day to three times a day (I just updated med list from BID to TID) Find out who she is seeing for diabetes (I know she must be seeing someone, because I would personally never prescribe NPH the way she is taking it); she needs to work with her doctor to get her A1c down If she needs a new referral, please enter Also, clarify and update her med list (insulins) if not correct at her appt Liver enzymes much improved Urine culture did not grow out anything

## 2015-09-27 NOTE — Telephone Encounter (Signed)
I don't know where to start with adjusting that insulin regimen; have her see endo please; referral entered

## 2015-09-28 NOTE — Telephone Encounter (Signed)
Patient notified

## 2015-09-30 ENCOUNTER — Other Ambulatory Visit: Payer: Self-pay

## 2015-09-30 DIAGNOSIS — M5136 Other intervertebral disc degeneration, lumbar region: Secondary | ICD-10-CM | POA: Diagnosis not present

## 2015-09-30 NOTE — Telephone Encounter (Signed)
Routing to provider  

## 2015-10-01 MED ORDER — MAGNESIUM OXIDE 400 MG PO TABS: 400.0000 mg | ORAL_TABLET | Freq: Three times a day (TID) | ORAL | Status: DC

## 2015-10-01 NOTE — Telephone Encounter (Signed)
rx approved

## 2015-10-02 IMAGING — CT CT STONE STUDY
2 of 4 series · 16 of 46 positions shown, 18 images · non-contrast
Comparison: 04/28/2011

CLINICAL DATA: Right flank pain for 1 day.

EXAM:
CT ABDOMEN AND PELVIS WITHOUT CONTRAST
TECHNIQUE: Multidetector CT imaging of the abdomen and pelvis was performed
following the standard protocol without IV contrast.

[Series 2: stone standard full · axial · 0.78mm/px · z∈[-1161,-741]mm · 13 of 92 slices shown, 15 images]
[im 4/92  soft-tissue]
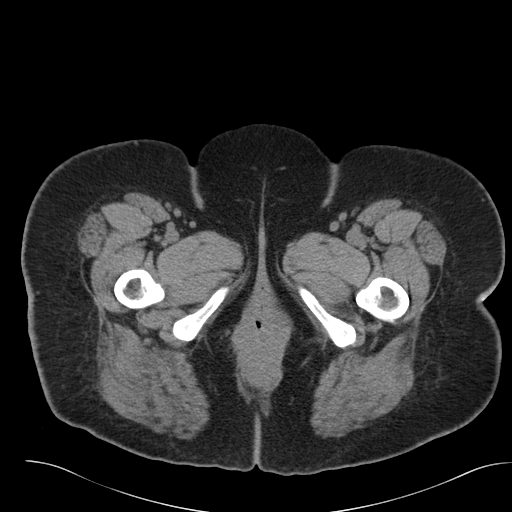
[im 4/92  bone]
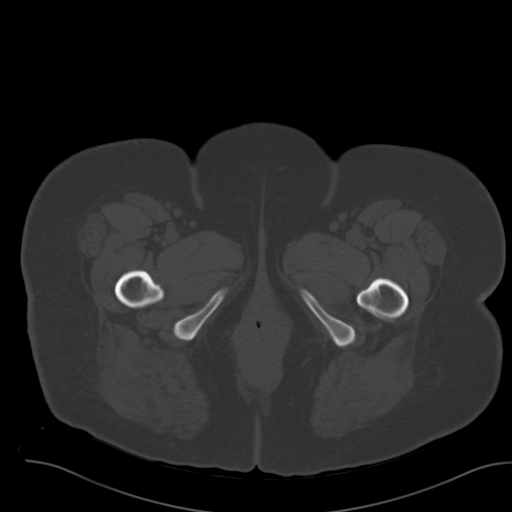
[im 12/92  soft-tissue]
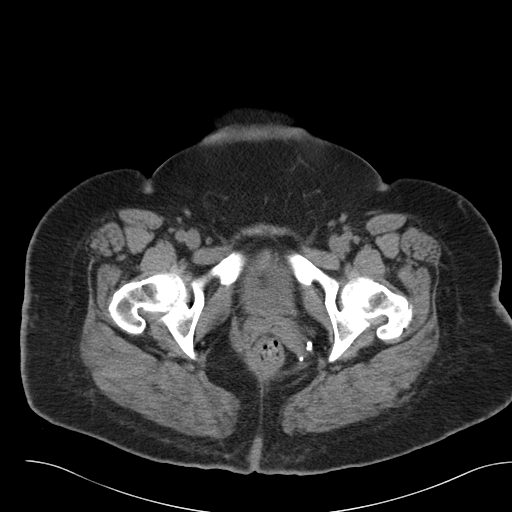
[im 19/92  soft-tissue]
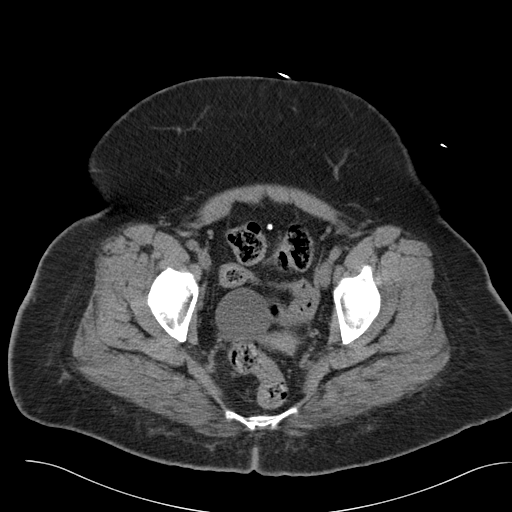
[im 27/92  soft-tissue]
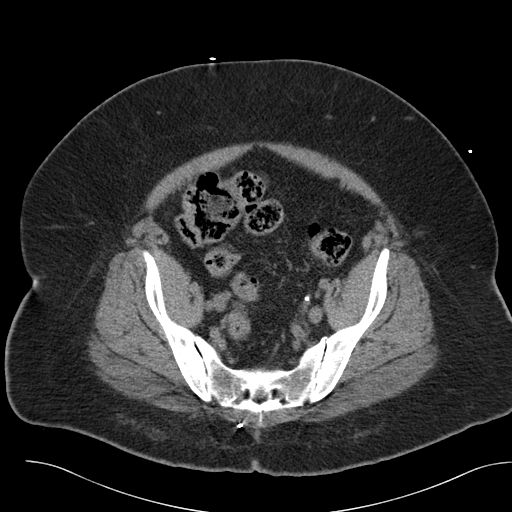
[im 31/92  soft-tissue]
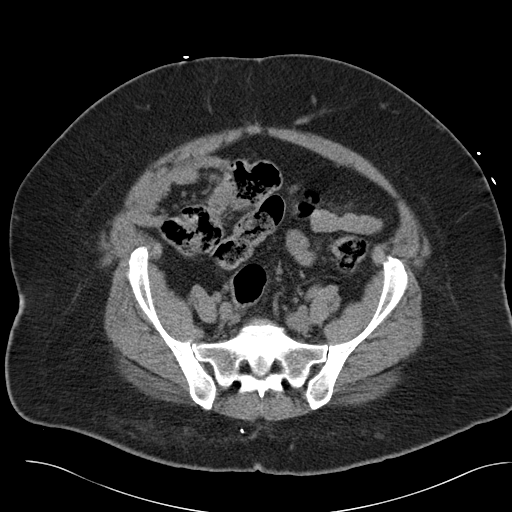
[im 38/92  soft-tissue]
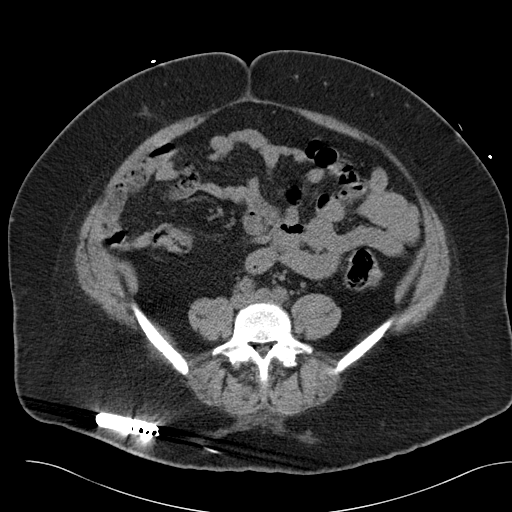
[im 46/92  soft-tissue]
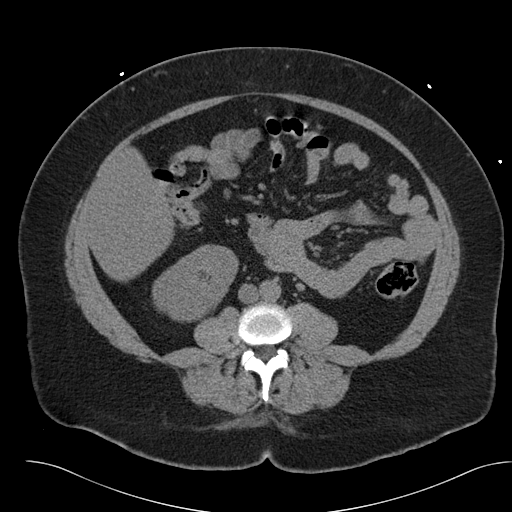
[im 54/92  soft-tissue]
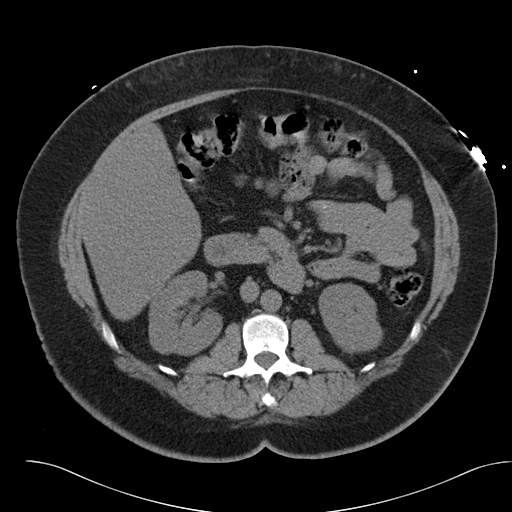
[im 61/92  soft-tissue]
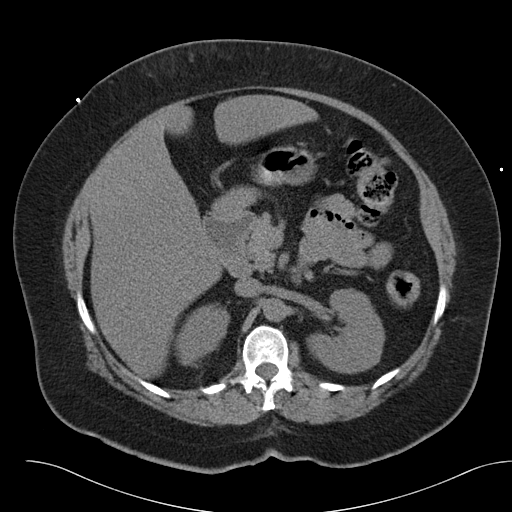
[im 61/92  bone]
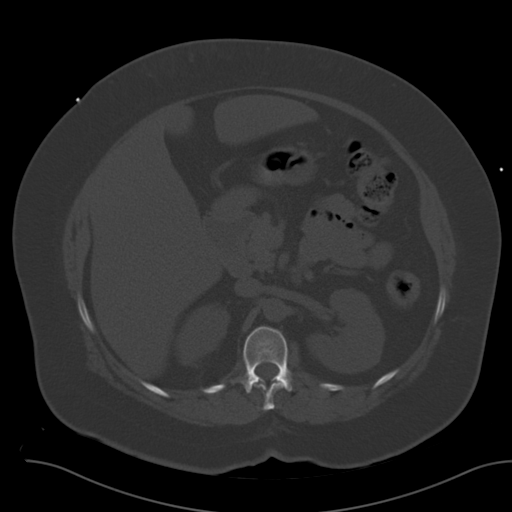
[im 65/92  soft-tissue]
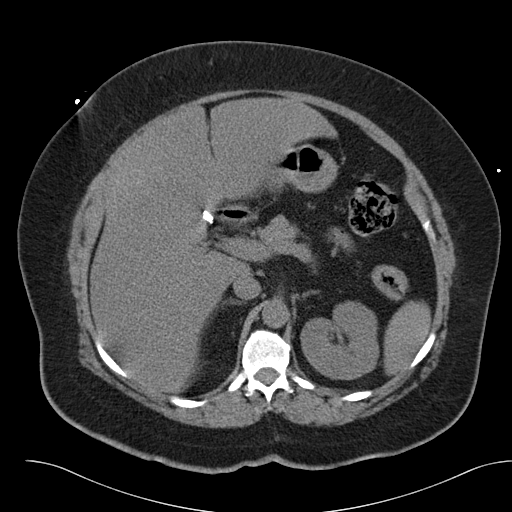
[im 73/92  soft-tissue]
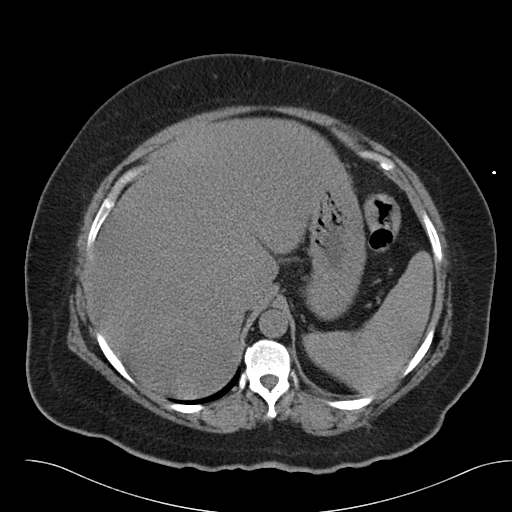
[im 80/92  soft-tissue]
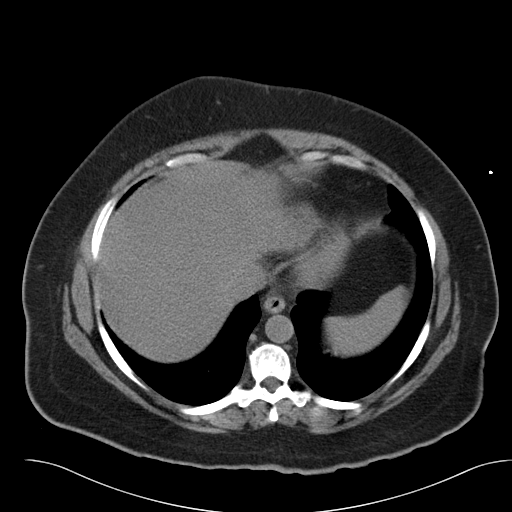
[im 88/92  soft-tissue]
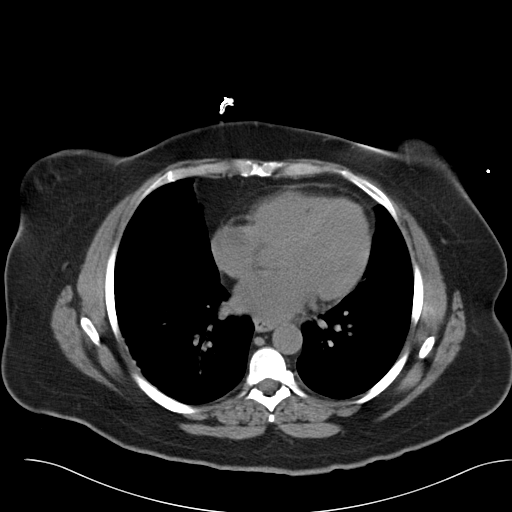

[Series 5: cor stone standard full · coronal · 0.79mm/px · 3 of 172 slices shown]
[im 58/172  soft-tissue]
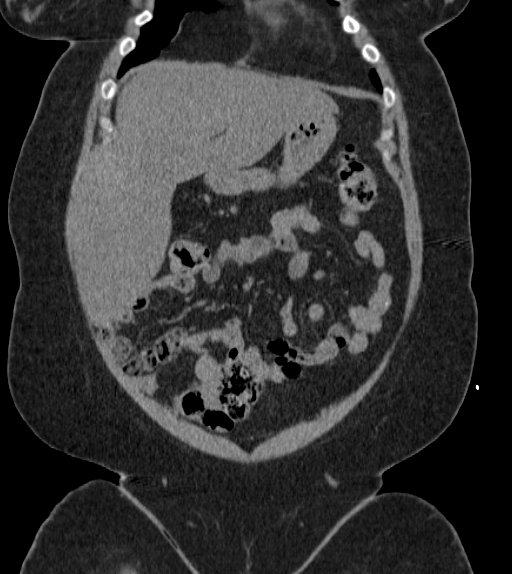
[im 77/172  soft-tissue]
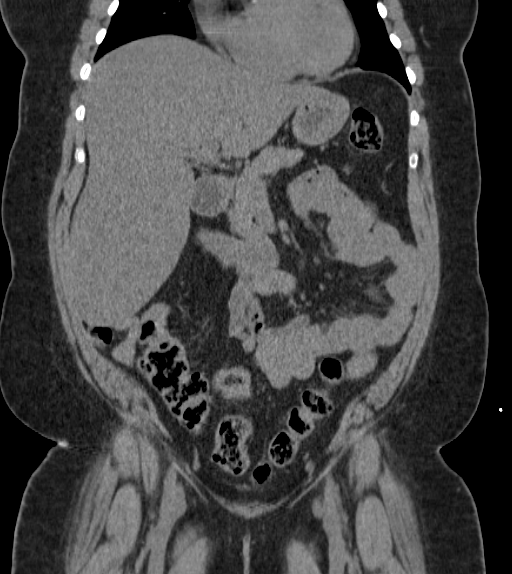
[im 96/172  soft-tissue]
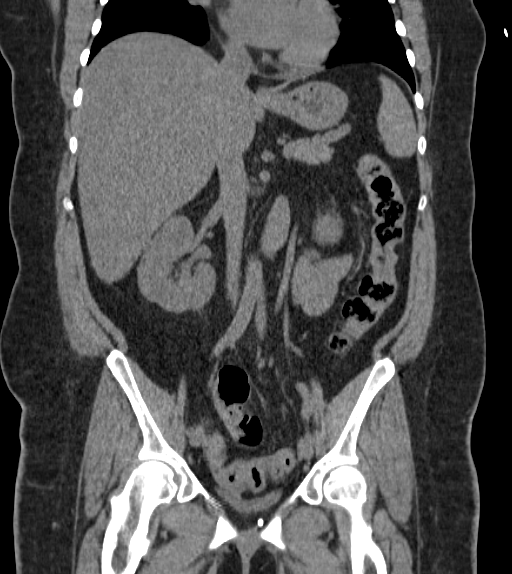

[16 of 46 positions shown; findings below may reference images not displayed]

FINDINGS: Patchy subpleural ground-glass opacities are present in both lung
bases, right greater than left. There is no pleural effusion.

Diffusely low attenuation of the liver is consistent with mild
steatosis. There is a 1.0 cm mildly hyperattenuating focus in the
left hepatic lobe (series 2, image 16). The liver appears mildly
enlarged. The gallbladder is surgically absent. The spleen, adrenal
glands, and pancreas have an unremarkable unenhanced appearance. The
kidneys have an unremarkable appearance without calculi or
hydronephrosis seen. No stones are identified along the course of
the ureters, which are nondilated.

The small and large bowel are nondilated without evidence of
obstruction. No gross bowel wall thickening is identified. The
appendix is not identified.

Incidental note is made of a benign-appearing 4.4 cm right ovarian
cyst. Bladder is decompressed. No free fluid or enlarged lymph nodes
are identified. Generator pack and lead are noted in the right
buttock with lead coursing into the right posterior pelvis.
IMPRESSION: 1. No evidence of urinary tract calculi, hydronephrosis, or other
acute abnormality in the abdomen or pelvis.
2. Hepatic steatosis. 1 cm focus of mildly increased density is
present in the left hepatic lobe, not seen on prior studies.
Considerations include an area of focal fatty sparing versus small
soft tissue mass. Consider further evaluation with nonemergent
abdominal MRI without and with contrast.
3. Patchy subpleural ground-glass opacities in the lung bases, most
likely atelectasis.

## 2015-10-06 ENCOUNTER — Encounter: Payer: Self-pay | Admitting: Obstetrics and Gynecology

## 2015-10-06 ENCOUNTER — Ambulatory Visit: Payer: Self-pay | Admitting: Obstetrics and Gynecology

## 2015-10-12 ENCOUNTER — Encounter: Payer: Self-pay | Admitting: Obstetrics and Gynecology

## 2015-10-12 ENCOUNTER — Ambulatory Visit (INDEPENDENT_AMBULATORY_CARE_PROVIDER_SITE_OTHER): Payer: Medicare Other | Admitting: Obstetrics and Gynecology

## 2015-10-12 VITALS — BP 124/73 | HR 92 | Resp 16 | Ht 59.0 in | Wt 186.7 lb

## 2015-10-12 DIAGNOSIS — R32 Unspecified urinary incontinence: Secondary | ICD-10-CM | POA: Diagnosis not present

## 2015-10-12 LAB — BLADDER SCAN AMB NON-IMAGING: Scan Result: 55

## 2015-10-12 LAB — MICROSCOPIC EXAMINATION: RBC MICROSCOPIC, UA: NONE SEEN /HPF (ref 0–?)

## 2015-10-12 LAB — URINALYSIS, COMPLETE
Bilirubin, UA: NEGATIVE
KETONES UA: NEGATIVE
NITRITE UA: NEGATIVE
PH UA: 5 (ref 5.0–7.5)
Specific Gravity, UA: 1.02 (ref 1.005–1.030)
Urobilinogen, Ur: 0.2 mg/dL (ref 0.2–1.0)

## 2015-10-12 NOTE — Progress Notes (Signed)
10/12/2015 10:47 AM   [name] Apr 14, 1966 [id number]  Referring provider: Arnetha Courser, MD [address] [place], [place] [address]  Chief Complaint  Patient presents with  . Urinary Incontinence  . Establish Care    HPI: Patient is a 50 year old female with a history of PVD, diabetes, IC, CKD stage III, bipolar disorder, OAB status post InterStim placement in 2011.  She reports that the device was placed by Dr. Davis Gourd. She reports excellent management of her urinary symptoms of incontinence and frequency since the device was placed until the last few months.  She has not had her device interrogated since 2011.  She thinks her battery may have died.  She reports she is now experiencing dysuria and right sided groin pain. Also reports multiple daily episodes of urinary incontinence over the last 2 months.  Now having to wear a pads daily.    Nocturia 2-3 times per night with occasional enuresis.  Does wear pull ups at night.   No exacerbating or alleviating factors. No fevers, gross hematuria, dysuria or flank pain.  09/22/15 HgbA1C 9.9 Cr 0.90   PMH: Past Medical History  Diagnosis Date  . High blood pressure   . Heart attack ([place])   . Irregular heart rhythm   . Seasonal allergies   . Chronic headaches   . Sleep apnea   . Hyperlipidemia   . COPD (chronic obstructive pulmonary disease) ([place])   . Asthma   . Hypothyroid   . Morbid obesity ([place])   . OSA (obstructive sleep apnea)   . RLS (restless legs syndrome)   . GERD (gastroesophageal reflux disease)   . Bipolar depression ([place])   . Vitamin D deficiency   . Urinary incontinence   . Hypoxemia   . Liver lesion     Favored to be benign on CT; MRI of abd w/contract in Aug 2016. Liver Biopsy-Negative, March 2016  . Acute renal failure ([place])   . Hypomagnesemia   . Hypertension   . Headache   . Diabetes mellitus without complication (HCC)     Type 2, insulin dependent. Patient also takes Metformin.  . CHF (congestive  heart failure) ([place])   . Diabetic neuropathy ([place])   . Pinched nerve     Back    Surgical History: Past Surgical History  Procedure Laterality Date  . Abdominal hysterectomy    . Tubal ligation    . Liver biopsy  March 2016    Negative  . Abdominal hysterectomy      Partial  . Cholecystectomy    . Bladder surgery      x 2  . Lung biopsy      Home Medications:    Medication List       This list is accurate as of: 10/12/15 10:47 AM.  Always use your most recent med list.               ACCU-CHEK AVIVA PLUS test strip  Generic drug:  glucose blood     ACCU-CHEK SOFTCLIX LANCETS lancets     acetaminophen-codeine 300-30 MG tablet  Commonly known as:  TYLENOL #3     cyclobenzaprine 10 MG tablet  Commonly known as:  FLEXERIL  Take 1 tablet (10 mg total) by mouth at bedtime as needed for muscle spasms.     DULoxetine 60 MG capsule  Commonly known as:  CYMBALTA  Take 120 mg by mouth daily.     Fenofibric Acid 45 MG Cpdr  TAKE 1  CAPSULE BY MOUTH EACH DAY     fluticasone 50 MCG/ACT nasal spray  Commonly known as:  FLONASE  Place 2 sprays into both nostrils daily.     insulin lispro 100 UNIT/ML injection  Commonly known as:  HUMALOG  Inject into the skin 3 (three) times daily as needed for high blood sugar. Sliding Scale     insulin NPH Human 100 UNIT/ML injection  Commonly known as:  HUMULIN N,NOVOLIN N  Inject into the skin. 75 units in the am, 50 units in at lunch, 75 units at night     lamoTRIgine 150 MG tablet  Commonly known as:  LAMICTAL  Take 150 mg by mouth daily.     levothyroxine 75 MCG tablet  Commonly known as:  SYNTHROID, LEVOTHROID  TAKE 1 TABLET BY MOUTH EVERY DAY.     LINZESS 145 MCG Caps capsule  Generic drug:  Linaclotide     magnesium oxide 400 MG tablet  Commonly known as:  MAG-OX  Take 1 tablet (400 mg total) by mouth 3 (three) times daily.     metoprolol tartrate 25 MG tablet  Commonly known as:  LOPRESSOR  TAKE 1/2 TABLET BY  MOUTH TWICE A DAY     multivitamin capsule  Take by mouth.     pantoprazole 40 MG tablet  Commonly known as:  PROTONIX  Take 1 tablet (40 mg total) by mouth daily.     QUEtiapine 25 MG tablet  Commonly known as:  SEROQUEL  Take 25 mg by mouth every morning. Take one in am, one at lunch     QUEtiapine 400 MG tablet  Commonly known as:  SEROQUEL  Take 800 mg by mouth at bedtime. Take 2 tabs at bedtime     REXULTI 1 MG Tabs  Generic drug:  Brexpiprazole  Take 1 mg by mouth.     topiramate 25 MG tablet  Commonly known as:  TOPAMAX  Take 1 tablet (25 mg total) by mouth 2 (two) times daily.     zolpidem 10 MG tablet  Commonly known as:  AMBIEN  Take 10 mg by mouth.        Allergies:  Allergies  Allergen Reactions  . Morphine And Related     anaphylaxis  . Codeine     REACTION: itch, rash  . Codeine     itching  . Hydrocodone-Acetaminophen     REACTION: rash  . Mirtazapine     REACTION: closes throat  . Morphine And Related   . Sulfamethoxazole-Trimethoprim     REACTION: [place] all over  . Vicodin [Hydrocodone-Acetaminophen]     itching    Family History: Family History  Problem Relation Age of Onset  . Heart disease Maternal Grandfather   . Rheum arthritis Maternal Grandfather   . Cancer Maternal Grandfather     liver  . Cancer Paternal Grandmother     lung  . Diabetes Mother   . Heart disease Mother   . Hyperlipidemia Mother   . Hypertension Mother   . Kidney disease Mother   . Mental illness Mother   . Hypothyroidism Mother   . Stroke Mother   . Osteoporosis Mother   . Glaucoma Mother   . Hypertension Father   . Asthma Daughter   . Cancer Daughter     throat    Social History:  reports that she has never smoked. She has never used smokeless tobacco. She reports that she does not drink alcohol or use illicit drugs.  ROS: UROLOGY Frequent Urination?: Yes Hard to postpone urination?: Yes Burning/pain with urination?: Yes Get up at night to  urinate?: Yes Leakage of urine?: Yes Urine stream starts and stops?: No Trouble starting stream?: No Do you have to strain to urinate?: No Blood in urine?: No Urinary tract infection?: Yes Sexually transmitted disease?: No Injury to kidneys or bladder?: Yes Painful intercourse?: No Weak stream?: Yes Currently pregnant?: No Vaginal bleeding?: No Last menstrual period?: n  Gastrointestinal Nausea?: No Vomiting?: No Indigestion/heartburn?: Yes Diarrhea?: No Constipation?: No  Constitutional Fever: No Night sweats?: No Weight loss?: No Fatigue?: Yes  Skin Skin rash/lesions?: No Itching?: No  Eyes Blurred vision?: Yes Double vision?: No  Ears/Nose/Throat Sore throat?: No Sinus problems?: Yes  Hematologic/Lymphatic Swollen glands?: No Easy bruising?: Yes  Cardiovascular Leg swelling?: Yes Chest pain?: No  Respiratory Cough?: No Shortness of breath?: Yes  Endocrine Excessive thirst?: Yes  Musculoskeletal Back pain?: Yes Joint pain?: Yes  Neurological Headaches?: Yes Dizziness?: Yes  Psychologic Depression?: Yes Anxiety?: Yes  Physical Exam: BP 124/73 mmHg  Pulse 92  Resp 16  Ht 4' 11"  (1.499 m)  Wt 186 lb 11.2 oz (84.687 kg)  BMI 37.69 kg/m2  Constitutional:  Alert and oriented, No acute distress. HEENT: [place] AT, moist mucus membranes.  Trachea midline, no masses. Cardiovascular: No clubbing, cyanosis, or edema. Respiratory: Normal respiratory effort, no increased work of breathing. GI: Abdomen is soft, nontender, nondistended, no abdominal masses GU: No CVA tenderness, scar visible over interstim device Skin: No rashes, bruises or suspicious lesions. Lymph: No cervical or inguinal adenopathy. Neurologic: Grossly intact, no focal deficits, moving all 4 extremities. Psychiatric: Normal mood and affect.  Laboratory Data:  Lab Results  Component Value Date   WBC 8.1 09/22/2015   HGB 10.8* 09/10/2015   HCT 34.8 09/22/2015   MCV 90  09/22/2015   PLT 337 09/22/2015    Lab Results  Component Value Date   CREATININE 0.90 09/22/2015    No results found for: PSA  No results found for: TESTOSTERONE  Lab Results  Component Value Date   HGBA1C 9.9* 09/22/2015    Urinalysis Results for orders placed or performed in visit on 10/12/15  Microscopic Examination  Result Value Ref Range   WBC, UA 11-30 (A) 0 -  5 /hpf   RBC, UA None seen 0 -  2 /hpf   Epithelial Cells (non renal) 0-10 0 - 10 /hpf   Bacteria, UA Moderate (A) None seen/Few  Urinalysis, Complete  Result Value Ref Range   Specific Gravity, UA 1.020 1.005 - 1.030   pH, UA 5.0 5.0 - 7.5   Color, UA Yellow Yellow   Appearance Ur Cloudy (A) Clear   Leukocytes, UA 1+ (A) Negative   Protein, UA Trace (A) Negative/Trace   Glucose, UA 3+ (A) Negative   Ketones, UA Negative Negative   RBC, UA Trace (A) Negative   Bilirubin, UA Negative Negative   Urobilinogen, Ur 0.2 0.2 - 1.0 mg/dL   Nitrite, UA Negative Negative   Microscopic Examination See below:   BLADDER SCAN AMB NON-IMAGING  Result Value Ref Range   Scan Result 55 mL     Pertinent Imaging:   Assessment & Plan:    1. Urinary incontinence, unspecified incontinence type- PVR 30m. UA slightly suspicious for infection. Specimen sent for culture. Attempted to interrogate patient's device today. Patient's programmer was not charged and it appears that her InterStim battery life is exhausted. I will have her follow up with Dr.  McDermott for further evaluation and possible scheduling for device battery replacement. - Urinalysis, Complete - BLADDER SCAN AMB NON-IMAGING   Return for f/u with Dr. Merri Ray.  These notes generated with voice recognition software. I apologize for typographical errors.  Herbert Moors, [facility] [address] [place], [place] [address] [phone]

## 2015-10-14 ENCOUNTER — Other Ambulatory Visit: Payer: Self-pay | Admitting: Orthopedic Surgery

## 2015-10-14 DIAGNOSIS — M5136 Other intervertebral disc degeneration, lumbar region: Secondary | ICD-10-CM

## 2015-10-14 LAB — CULTURE, URINE COMPREHENSIVE

## 2015-10-15 ENCOUNTER — Telehealth: Payer: Self-pay

## 2015-10-15 ENCOUNTER — Ambulatory Visit (INDEPENDENT_AMBULATORY_CARE_PROVIDER_SITE_OTHER): Payer: Medicare Other | Admitting: Urology

## 2015-10-15 VITALS — Ht 59.0 in | Wt 188.9 lb

## 2015-10-15 DIAGNOSIS — R0902 Hypoxemia: Secondary | ICD-10-CM | POA: Insufficient documentation

## 2015-10-15 DIAGNOSIS — J45909 Unspecified asthma, uncomplicated: Secondary | ICD-10-CM | POA: Insufficient documentation

## 2015-10-15 DIAGNOSIS — E669 Obesity, unspecified: Secondary | ICD-10-CM | POA: Insufficient documentation

## 2015-10-15 DIAGNOSIS — E0590 Thyrotoxicosis, unspecified without thyrotoxic crisis or storm: Secondary | ICD-10-CM | POA: Insufficient documentation

## 2015-10-15 DIAGNOSIS — G2581 Restless legs syndrome: Secondary | ICD-10-CM | POA: Insufficient documentation

## 2015-10-15 DIAGNOSIS — IMO0002 Reserved for concepts with insufficient information to code with codable children: Secondary | ICD-10-CM | POA: Insufficient documentation

## 2015-10-15 DIAGNOSIS — D126 Benign neoplasm of colon, unspecified: Secondary | ICD-10-CM | POA: Insufficient documentation

## 2015-10-15 DIAGNOSIS — N393 Stress incontinence (female) (male): Secondary | ICD-10-CM

## 2015-10-15 DIAGNOSIS — K5900 Constipation, unspecified: Secondary | ICD-10-CM | POA: Insufficient documentation

## 2015-10-15 DIAGNOSIS — T50905A Adverse effect of unspecified drugs, medicaments and biological substances, initial encounter: Secondary | ICD-10-CM | POA: Insufficient documentation

## 2015-10-15 DIAGNOSIS — E1140 Type 2 diabetes mellitus with diabetic neuropathy, unspecified: Secondary | ICD-10-CM

## 2015-10-15 DIAGNOSIS — K769 Liver disease, unspecified: Secondary | ICD-10-CM | POA: Insufficient documentation

## 2015-10-15 DIAGNOSIS — R110 Nausea: Secondary | ICD-10-CM | POA: Insufficient documentation

## 2015-10-15 DIAGNOSIS — E785 Hyperlipidemia, unspecified: Secondary | ICD-10-CM | POA: Insufficient documentation

## 2015-10-15 DIAGNOSIS — K7581 Nonalcoholic steatohepatitis (NASH): Secondary | ICD-10-CM | POA: Insufficient documentation

## 2015-10-15 DIAGNOSIS — D649 Anemia, unspecified: Secondary | ICD-10-CM | POA: Insufficient documentation

## 2015-10-15 DIAGNOSIS — F319 Bipolar disorder, unspecified: Secondary | ICD-10-CM | POA: Insufficient documentation

## 2015-10-15 DIAGNOSIS — Z8659 Personal history of other mental and behavioral disorders: Secondary | ICD-10-CM | POA: Insufficient documentation

## 2015-10-15 DIAGNOSIS — Z8639 Personal history of other endocrine, nutritional and metabolic disease: Secondary | ICD-10-CM | POA: Insufficient documentation

## 2015-10-15 DIAGNOSIS — R32 Unspecified urinary incontinence: Secondary | ICD-10-CM

## 2015-10-15 DIAGNOSIS — E1165 Type 2 diabetes mellitus with hyperglycemia: Secondary | ICD-10-CM

## 2015-10-15 DIAGNOSIS — K921 Melena: Secondary | ICD-10-CM | POA: Insufficient documentation

## 2015-10-15 DIAGNOSIS — J449 Chronic obstructive pulmonary disease, unspecified: Secondary | ICD-10-CM | POA: Insufficient documentation

## 2015-10-15 DIAGNOSIS — G4733 Obstructive sleep apnea (adult) (pediatric): Secondary | ICD-10-CM | POA: Insufficient documentation

## 2015-10-15 DIAGNOSIS — K716 Toxic liver disease with hepatitis, not elsewhere classified: Secondary | ICD-10-CM | POA: Insufficient documentation

## 2015-10-15 HISTORY — DX: Reserved for concepts with insufficient information to code with codable children: IMO0002

## 2015-10-15 HISTORY — DX: Type 2 diabetes mellitus with diabetic neuropathy, unspecified: E11.40

## 2015-10-15 NOTE — Progress Notes (Signed)
10/15/2015 11:01 AM   [name] [birth year] [id number]  Referring provider: Arnetha Courser, MD [address] [place], [place] [address]  Chief Complaint  Patient presents with  . Follow-up    insterstim, incontinence     HPI: [name]: Patient is a 50 year old female with a history of PVD, diabetes, IC, CKD stage III, bipolar disorder, OAB status post InterStim placement in 2011.  She reports that the device was placed by Dr. Davis Gourd. She reports excellent management of her urinary symptoms of incontinence and frequency since the device was placed until the last few months. She has not had her device interrogated since 2011. She thinks her battery may have died.  She reports she is now experiencing dysuria and right sided groin pain. Also reports multiple daily episodes of urinary incontinence over the last 2 months. Now having to wear a pads daily. Nocturia 2-3 times per night with occasional enuresis. Does wear pull ups at night. No exacerbating or alleviating factors. No fevers, gross hematuria, dysuria or flank pain.  The patient had InterStim in 2011 for urge incontinence and enuresis wearing 4-5 pads a day and right lower quadrant discomfort. She said she was dry and not wearing pads until November  He currently leaks with coughing sneezing bending and lifting. She now has urge incontinence and enuresis wearing 3 pads a day moderately wet. She'll leak when she goes from a sitting to standing position. Voiding every hour. She gets up 2-3 times a night. She now has her right lower quadrant pain.  She says when she would turn the device up she feels it over the device and not in the vagina.  She's had a hysterectomy and no previous GU surgery  Modifying factors: There are no other modifying factors  Associated signs and symptoms: There are no other associated signs and symptoms Aggravating and relieving factors: There are no other aggravating or relieving factors Severity:  Moderate Duration: Persistent   PMH: Past Medical History  Diagnosis Date  . High blood pressure   . Heart attack ([place])   . Irregular heart rhythm   . Seasonal allergies   . Chronic headaches   . Sleep apnea   . Hyperlipidemia   . COPD (chronic obstructive pulmonary disease) ([place])   . Asthma   . Hypothyroid   . Morbid obesity ([place])   . OSA (obstructive sleep apnea)   . RLS (restless legs syndrome)   . GERD (gastroesophageal reflux disease)   . Bipolar depression ([place])   . Vitamin D deficiency   . Urinary incontinence   . Hypoxemia   . Liver lesion     Favored to be benign on CT; MRI of abd w/contract in Aug 2016. Liver Biopsy-Negative, March 2016  . Acute renal failure ([place])   . Hypomagnesemia   . Hypertension   . Headache   . Diabetes mellitus without complication (HCC)     Type 2, insulin dependent. Patient also takes Metformin.  . CHF (congestive heart failure) ([place])   . Diabetic neuropathy ([place])   . Pinched nerve     Back    Surgical History: Past Surgical History  Procedure Laterality Date  . Abdominal hysterectomy    . Tubal ligation    . Liver biopsy  March 2016    Negative  . Abdominal hysterectomy      Partial  . Cholecystectomy    . Bladder surgery      x 2  . Lung biopsy  Home Medications:    Medication List       This list is accurate as of: 10/15/15 11:01 AM.  Always use your most recent med list.               ACCU-CHEK AVIVA PLUS test strip  Generic drug:  glucose blood     ACCU-CHEK SOFTCLIX LANCETS lancets     acetaminophen-codeine 300-30 MG tablet  Commonly known as:  TYLENOL #3     cyclobenzaprine 10 MG tablet  Commonly known as:  FLEXERIL  Take 1 tablet (10 mg total) by mouth at bedtime as needed for muscle spasms.     DULoxetine 60 MG capsule  Commonly known as:  CYMBALTA  Take 120 mg by mouth daily.     Fenofibric Acid 45 MG Cpdr  TAKE 1 CAPSULE BY MOUTH EACH DAY     fluticasone 50 MCG/ACT nasal spray    Commonly known as:  FLONASE  Place 2 sprays into both nostrils daily.     insulin lispro 100 UNIT/ML injection  Commonly known as:  HUMALOG  Inject into the skin 3 (three) times daily as needed for high blood sugar. Sliding Scale     insulin NPH Human 100 UNIT/ML injection  Commonly known as:  HUMULIN N,NOVOLIN N  Inject into the skin. 75 units in the am, 50 units in at lunch, 75 units at night     lamoTRIgine 150 MG tablet  Commonly known as:  LAMICTAL  Take 150 mg by mouth daily.     levothyroxine 75 MCG tablet  Commonly known as:  SYNTHROID, LEVOTHROID  TAKE 1 TABLET BY MOUTH EVERY DAY.     LINZESS 145 MCG Caps capsule  Generic drug:  Linaclotide     magnesium oxide 400 MG tablet  Commonly known as:  MAG-OX  Take 1 tablet (400 mg total) by mouth 3 (three) times daily.     metoprolol tartrate 25 MG tablet  Commonly known as:  LOPRESSOR  TAKE 1/2 TABLET BY MOUTH TWICE A DAY     multivitamin capsule  Take by mouth.     pantoprazole 40 MG tablet  Commonly known as:  PROTONIX  Take 1 tablet (40 mg total) by mouth daily.     QUEtiapine 25 MG tablet  Commonly known as:  SEROQUEL  Take 25 mg by mouth every morning. Take one in am, one at lunch     QUEtiapine 400 MG tablet  Commonly known as:  SEROQUEL  Take 800 mg by mouth at bedtime. Take 2 tabs at bedtime     REXULTI 1 MG Tabs  Generic drug:  Brexpiprazole  Take 1 mg by mouth.     topiramate 25 MG tablet  Commonly known as:  TOPAMAX  Take 1 tablet (25 mg total) by mouth 2 (two) times daily.     zolpidem 10 MG tablet  Commonly known as:  AMBIEN  Take 10 mg by mouth.        Allergies:  Allergies  Allergen Reactions  . Morphine And Related     anaphylaxis  . Codeine     REACTION: itch, rash  . Codeine     itching  . Hydrocodone-Acetaminophen     REACTION: rash  . Mirtazapine     REACTION: closes throat  . Morphine And Related   . Sulfamethoxazole-Trimethoprim     REACTION: [place] all over  .  Vicodin [Hydrocodone-Acetaminophen]     itching    Family History: Family History  Problem Relation Age of Onset  . Heart disease Maternal Grandfather   . Rheum arthritis Maternal Grandfather   . Cancer Maternal Grandfather     liver  . Cancer Paternal Grandmother     lung  . Diabetes Mother   . Heart disease Mother   . Hyperlipidemia Mother   . Hypertension Mother   . Kidney disease Mother   . Mental illness Mother   . Hypothyroidism Mother   . Stroke Mother   . Osteoporosis Mother   . Glaucoma Mother   . Hypertension Father   . Asthma Daughter   . Cancer Daughter     throat    Social History:  reports that she has never smoked. She has never used smokeless tobacco. She reports that she does not drink alcohol or use illicit drugs.  ROS: UROLOGY Frequent Urination?: Yes Hard to postpone urination?: Yes Burning/pain with urination?: Yes Get up at night to urinate?: Yes Leakage of urine?: Yes Urine stream starts and stops?: No Trouble starting stream?: No Do you have to strain to urinate?: No Blood in urine?: No Urinary tract infection?: No Sexually transmitted disease?: No Injury to kidneys or bladder?: No Painful intercourse?: Yes Weak stream?: No Currently pregnant?: No Vaginal bleeding?: No Last menstrual period?: No  Gastrointestinal Nausea?: No Vomiting?: No Indigestion/heartburn?: Yes Diarrhea?: No Constipation?: No  Constitutional Fever: No Night sweats?: Yes Weight loss?: No Fatigue?: Yes  Skin Skin rash/lesions?: No Itching?: No  Eyes Blurred vision?: Yes Double vision?: No  Ears/Nose/Throat Sore throat?: No Sinus problems?: Yes  Hematologic/Lymphatic Swollen glands?: No Easy bruising?: No  Cardiovascular Leg swelling?: No Chest pain?: No  Respiratory Cough?: No Shortness of breath?: Yes  Endocrine Excessive thirst?: Yes  Musculoskeletal Back pain?: Yes Joint pain?: Yes  Neurological Headaches?: Yes Dizziness?:  No  Psychologic Depression?: Yes Anxiety?: Yes  Physical Exam: Ht 4' 11"  (1.499 m)  Wt 85.684 kg (188 lb 14.4 oz)  BMI 38.13 kg/m2   GU: No CVA tenderness. No evidence of infection of device  Laboratory Data: Lab Results  Component Value Date   WBC 8.1 09/22/2015   HGB 10.8* 09/10/2015   HCT 34.8 09/22/2015   MCV 90 09/22/2015   PLT 337 09/22/2015    Lab Results  Component Value Date   CREATININE 0.90 09/22/2015    No results found for: PSA  No results found for: TESTOSTERONE  Lab Results  Component Value Date   HGBA1C 9.9* 09/22/2015    Urinalysis    Component Value Date/Time   COLORURINE AMBER* 09/10/2015 1143   COLORURINE Straw 10/09/2014 1808   APPEARANCEUR HAZY* 09/10/2015 1143   APPEARANCEUR Clear 10/09/2014 1808   LABSPEC 1.030 09/10/2015 1143   LABSPEC 1.026 10/09/2014 1808   PHURINE 5.0 09/10/2015 1143   PHURINE 5.0 10/09/2014 1808   GLUCOSEU 3+* 10/12/2015 1012   GLUCOSEU >=500 10/09/2014 1808   HGBUR NEGATIVE 09/10/2015 1143   HGBUR Negative 10/09/2014 1808   HGBUR moderate 01/04/2009 0913   BILIRUBINUR Negative 10/12/2015 1012   BILIRUBINUR NEGATIVE 09/10/2015 1143   BILIRUBINUR Negative 10/09/2014 1808   KETONESUR TRACE* 09/10/2015 1143   KETONESUR Negative 10/09/2014 1808   PROTEINUR NEGATIVE 09/10/2015 1143   PROTEINUR Negative 10/09/2014 1808   UROBILINOGEN 0.2 01/04/2009 0913   NITRITE Negative 10/12/2015 1012   NITRITE NEGATIVE 09/10/2015 1143   NITRITE Negative 10/09/2014 1808   LEUKOCYTESUR 1+* 10/12/2015 1012   LEUKOCYTESUR TRACE* 09/10/2015 1143   LEUKOCYTESUR Negative 10/09/2014 1808    Pertinent Imaging: She  brought in a paper copy of a KUB. The IPG and lead are in good position. I did not see any fracture  Assessment & Plan:  The patient has enuresis and urge incontinence. She also reports stress incontinence. Based upon her history her primary problem is likely an all right the bladder with the device no longer working  well. She is not try to beta 3 agonists or Toviaz by history    Judson Roch did a great job and the device is on any impedance check is normal. When she turned the device up she  It vaginally. If she does not reach her treatment goal she will get new settings and we'll reseed accordingly  1. Mixed stress and urge incontinence 2. Enuresis 3. Abdominal pain 4. Increased frequency   Reece Packer, MD  [facility] [address] [place], [place] [address] [phone]

## 2015-10-15 NOTE — Telephone Encounter (Signed)
-----   Message from Roda Shutters, [place] sent at 10/14/2015  2:25 PM EST ----- Please send 5 patient her urine culture was negative for infection. Thanks

## 2015-10-15 NOTE — Telephone Encounter (Signed)
Spoke with pt in reference to ucx results. Pt voiced understanding.

## 2015-10-19 ENCOUNTER — Telehealth: Payer: Self-pay | Admitting: Family Medicine

## 2015-10-19 NOTE — Telephone Encounter (Signed)
Routing to provider  

## 2015-10-19 NOTE — Telephone Encounter (Signed)
Pt needs refill on [facility]il [facility]

## 2015-10-20 MED ORDER — CYCLOBENZAPRINE HCL 10 MG PO TABS: 10.0000 mg | ORAL_TABLET | Freq: Every evening | ORAL | Status: DC | PRN

## 2015-10-20 NOTE — Telephone Encounter (Signed)
rx sent

## 2015-10-21 DIAGNOSIS — G4733 Obstructive sleep apnea (adult) (pediatric): Secondary | ICD-10-CM | POA: Diagnosis not present

## 2015-10-21 DIAGNOSIS — R0902 Hypoxemia: Secondary | ICD-10-CM | POA: Diagnosis not present

## 2015-10-21 DIAGNOSIS — J449 Chronic obstructive pulmonary disease, unspecified: Secondary | ICD-10-CM | POA: Diagnosis not present

## 2015-10-22 ENCOUNTER — Telehealth: Payer: Self-pay | Admitting: Family Medicine

## 2015-10-22 ENCOUNTER — Ambulatory Visit: Payer: Self-pay | Admitting: Obstetrics and Gynecology

## 2015-10-22 ENCOUNTER — Encounter: Payer: Self-pay | Admitting: Obstetrics and Gynecology

## 2015-10-22 ENCOUNTER — Emergency Department
Admission: EM | Admit: 2015-10-22 | Discharge: 2015-10-22 | Disposition: A | Discharge status: Home / Self Care | Payer: Medicare Other | Attending: Emergency Medicine | Admitting: Emergency Medicine

## 2015-10-22 ENCOUNTER — Encounter: Payer: Self-pay | Admitting: Emergency Medicine

## 2015-10-22 DIAGNOSIS — R110 Nausea: Secondary | ICD-10-CM | POA: Diagnosis not present

## 2015-10-22 DIAGNOSIS — N183 Chronic kidney disease, stage 3 (moderate): Secondary | ICD-10-CM | POA: Insufficient documentation

## 2015-10-22 DIAGNOSIS — Z794 Long term (current) use of insulin: Secondary | ICD-10-CM | POA: Insufficient documentation

## 2015-10-22 DIAGNOSIS — I129 Hypertensive chronic kidney disease with stage 1 through stage 4 chronic kidney disease, or unspecified chronic kidney disease: Secondary | ICD-10-CM | POA: Diagnosis not present

## 2015-10-22 DIAGNOSIS — R52 Pain, unspecified: Secondary | ICD-10-CM | POA: Diagnosis present

## 2015-10-22 DIAGNOSIS — J111 Influenza due to unidentified influenza virus with other respiratory manifestations: Secondary | ICD-10-CM | POA: Insufficient documentation

## 2015-10-22 DIAGNOSIS — R079 Chest pain, unspecified: Secondary | ICD-10-CM | POA: Diagnosis not present

## 2015-10-22 DIAGNOSIS — Z79899 Other long term (current) drug therapy: Secondary | ICD-10-CM | POA: Diagnosis not present

## 2015-10-22 DIAGNOSIS — Z7951 Long term (current) use of inhaled steroids: Secondary | ICD-10-CM | POA: Diagnosis not present

## 2015-10-22 DIAGNOSIS — E1140 Type 2 diabetes mellitus with diabetic neuropathy, unspecified: Secondary | ICD-10-CM | POA: Diagnosis not present

## 2015-10-22 MED ORDER — PROMETHAZINE-DM 6.25-15 MG/5ML PO SYRP: 5.0000 mL | ORAL_SOLUTION | Freq: Four times a day (QID) | ORAL | Status: DC | PRN

## 2015-10-22 MED ORDER — OSELTAMIVIR PHOSPHATE 75 MG PO CAPS: 75.0000 mg | ORAL_CAPSULE | Freq: Two times a day (BID) | ORAL | Status: DC

## 2015-10-22 NOTE — Discharge Instructions (Signed)
Influenza, Adult Influenza ("the flu") is a viral infection of the respiratory tract. It occurs more often in winter months because people spend more time in close contact with one another. Influenza can make you feel very sick. Influenza easily spreads from person to person (contagious). CAUSES  Influenza is caused by a virus that infects the respiratory tract. You can catch the virus by breathing in droplets from an infected person's cough or sneeze. You can also catch the virus by touching something that was recently contaminated with the virus and then touching your mouth, nose, or eyes. RISKS AND COMPLICATIONS You may be at risk for a more severe case of influenza if you smoke cigarettes, have diabetes, have chronic heart disease (such as heart failure) or lung disease (such as asthma), or if you have a weakened immune system. Elderly people and pregnant women are also at risk for more serious infections. The most common problem of influenza is a lung infection (pneumonia). Sometimes, this problem can require emergency medical care and may be life threatening. SIGNS AND SYMPTOMS  Symptoms typically last 4 to 10 days and may include:  Fever.  Chills.  Headache, body aches, and muscle aches.  Sore throat.  Chest discomfort and cough.  Poor appetite.  Weakness or feeling tired.  Dizziness.  Nausea or vomiting. DIAGNOSIS  Diagnosis of influenza is often made based on your history and a physical exam. A nose or throat swab test can be done to confirm the diagnosis. TREATMENT  In mild cases, influenza goes away on its own. Treatment is directed at relieving symptoms. For more severe cases, your health care provider may prescribe antiviral medicines to shorten the sickness. Antibiotic medicines are not effective because the infection is caused by a virus, not by bacteria. HOME CARE INSTRUCTIONS  Take medicines only as directed by your health care provider.  Use a cool mist humidifier  to make breathing easier.  Get plenty of rest until your temperature returns to normal. This usually takes 3 to 4 days.  Drink enough fluid to keep your urine clear or pale yellow.  Cover yourmouth and nosewhen coughing or sneezing,and wash your handswellto prevent thevirusfrom spreading.  Stay homefromwork orschool untilthe fever is gonefor at least 85fll day. PREVENTION  An annual influenza vaccination (flu shot) is the best way to avoid getting influenza. An annual flu shot is now routinely recommended for all adults in the [place]:  You experiencechest pain, yourcough worsens,or you producemore mucus.  Youhave nausea,vomiting, ordiarrhea.  Your fever returns or gets worse. SEEK IMMEDIATE MEDICAL CARE IF:  You havetrouble breathing, you become short of breath,or your skin ornails becomebluish.  You have severe painor stiffnessin the neck.  You develop a sudden headache, or pain in the face or ear.  You have nausea or vomiting that you cannot control. MAKE SURE YOU:   Understand these instructions.  Will watch your condition.  Will get help right away if you are not doing well or get worse.   This information is not intended to replace advice given to you by your health care provider. Make sure you discuss any questions you have with your health care provider.   Document Released: 08/18/2000 Document Revised: 09/11/2014 Document Reviewed: 11/20/2011 Elsevier Interactive Patient Education 2016 Elsevier Inc[employer]  Nausea, Adult Nausea means you feel sick to your stomach or need to throw up (vomit). It may be a sign of a more serious problem. If nausea gets worse, you may throw up.  If you throw up a lot, you may lose too much body fluid (dehydration). HOME CARE   Get plenty of rest.  Ask your doctor how to replace body fluid losses (rehydrate).  Eat small amounts of food. Sip liquids more often.  Take all medicines as told by  your doctor. GET HELP RIGHT AWAY IF:  You have a fever.  You pass out (faint).  You keep throwing up or have blood in your throw up.  You are very weak, have dry lips or a dry mouth, or you are very thirsty (dehydrated).  You have dark or bloody poop (stool).  You have very bad chest or belly (abdominal) pain.  You do not get better after 2 days, or you get worse.  You have a headache. MAKE SURE YOU:  Understand these instructions.  Will watch your condition.  Will get help right away if you are not doing well or get worse.   This information is not intended to replace advice given to you by your health care provider. Make sure you discuss any questions you have with your health care provider.   Document Released: 08/10/2011 Document Revised: 11/13/2011 Document Reviewed: 08/10/2011 Elsevier Interactive Patient Education [employer].

## 2015-10-22 NOTE — Telephone Encounter (Signed)
Pt called stated she needs [name] to call her. Stated she does not have the energy to get out of bed, thought she was getting better but she is not. Stated she has to talk to her PCP before going to the ER so insurance will pay please call her ASAP. Thanks.

## 2015-10-22 NOTE — Telephone Encounter (Signed)
I called and left detailed msg; okay to go to urgent care or ER depending on badly she feels; sorry I was not able to see her to talk with her first Please do have me paged if needed

## 2015-10-22 NOTE — ED Provider Notes (Signed)
[facility] Emergency Department Provider Note  ____________________________________________  Time seen: Approximately 6:47 PM  I have reviewed the triage vital signs and the nursing notes.   HISTORY  Chief Complaint Generalized Body Aches    HPI [name] is a 50 y.o. female , NAD, reports to the emergency department via EMS with flu like symptoms. States symptoms started yesterday. Has had body aches, fever, fatigue, nausea. Some postnasal drip. Decreased appetite due to nausea and post nasal drip. Has had no vomiting, diarrhea, abdominal pain, headache, ear pain or sore throat or chest pain. Notes that both of her daughters and mother were diagnosed with flu this week. Their symptoms were the same at the onset.     Past Medical History  Diagnosis Date  . High blood pressure   . Heart attack ([place])   . Irregular heart rhythm   . Seasonal allergies   . Chronic headaches   . Sleep apnea   . Hyperlipidemia   . COPD (chronic obstructive pulmonary disease) ([place])   . Asthma   . Hypothyroid   . Morbid obesity ([place])   . OSA (obstructive sleep apnea)   . RLS (restless legs syndrome)   . GERD (gastroesophageal reflux disease)   . Bipolar depression ([place])   . Vitamin D deficiency   . Urinary incontinence   . Hypoxemia   . Liver lesion     Favored to be benign on CT; MRI of abd w/contract in Aug 2016. Liver Biopsy-Negative, March 2016  . Acute renal failure ([place])   . Hypomagnesemia   . Hypertension   . Headache   . Diabetes mellitus without complication (HCC)     Type 2, insulin dependent. Patient also takes Metformin.  . CHF (congestive heart failure) ([place])   . Diabetic neuropathy ([place])   . Pinched nerve     Back    Patient Active Problem List   Diagnosis Date Noted  . Adenomatous colon polyp 10/15/2015  . Absolute anemia 10/15/2015  . Airway hyperreactivity 10/15/2015  . Bipolar affective disorder ([place]) 10/15/2015  . H/O manic depressive  disorder 10/15/2015  . CN (constipation) 10/15/2015  . CAFL (chronic airflow limitation) ([place]) 10/15/2015  . H/O diabetes mellitus 10/15/2015  . Drug-induced hepatic toxicity 10/15/2015  . Blood in feces 10/15/2015  . HLD (hyperlipidemia) 10/15/2015  . Hyperthyroidism 10/15/2015  . Hypoxemia 10/15/2015  . Lesion of liver 10/15/2015  . Feeling bilious 10/15/2015  . NASH (nonalcoholic steatohepatitis) 10/15/2015  . Adiposity 10/15/2015  . Restless leg syndrome 10/15/2015  . Type 2 diabetes mellitus ([place]) 10/15/2015  . Severe obstructive sleep apnea 10/15/2015  . Elevated alkaline phosphatase level 09/22/2015  . Urinary bladder incontinence 09/22/2015  . Diarrhea 08/18/2015  . Low back pain with left-sided sciatica 08/18/2015  . Skin infection 08/18/2015  . Encounter for monitoring tricyclic antidepressant therapy 05/13/2015  . Bipolar disorder ([place]) 05/11/2015  . Liver disease 05/11/2015  . Diabetes ([place]) 05/11/2015  . Apnea, sleep 05/05/2015  . Heart valve disease 05/05/2015  . Urinary tract infection 03/24/2015  . Chronic kidney disease, stage III (moderate) 03/15/2015  . Anemia 03/15/2015  . Migraine 03/12/2015  . COPD (chronic obstructive pulmonary disease) ([place]) 01/20/2015  . 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 GERD (gastroesophageal reflux disease) 01/20/2015  . Disorder of smooth muscle 01/20/2015  . Hepatic encephalopathy ([place]) 01/20/2015  . Hypothyroid   . RLS (restless legs syndrome)   . Vitamin D deficiency   . Urinary incontinence   .  Liver lesion   . Acute renal failure ([place])   . Hypomagnesemia   . Obstructive sleep apnea 10/20/2013  . PERIPHERAL NEUROPATHY 03/04/2009  . HEMORRHOIDS, EXTERNAL 09/03/2008  . ALLERGIC RHINITIS 07/04/2007  . ASTHMA, PERSISTENT, MODERATE 07/04/2007  . INTERSTITIAL CYSTITIS 07/04/2007  . HYPERTRIGLYCERIDEMIA 07/03/2007  . FATIGUE, CHRONIC 07/03/2007  . HYPERTENSION, BENIGN ESSENTIAL 06/19/2007    Past Surgical  History  Procedure Laterality Date  . Abdominal hysterectomy    . Tubal ligation    . Liver biopsy  March 2016    Negative  . Abdominal hysterectomy      Partial  . Cholecystectomy    . Bladder surgery      x 2  . Lung biopsy      Current Outpatient Rx  Name  Route  Sig  Dispense  Refill  . ACCU-CHEK AVIVA PLUS test strip                 Dispense as written.   Marland Kitchen ACCU-CHEK SOFTCLIX LANCETS lancets               . acetaminophen-codeine (TYLENOL #3) 300-30 MG tablet               . Choline Fenofibrate (FENOFIBRIC ACID) 45 MG CPDR      TAKE 1 CAPSULE BY MOUTH EACH DAY   30 capsule   5   . cyclobenzaprine (FLEXERIL) 10 MG tablet   Oral   Take 1 tablet (10 mg total) by mouth at bedtime as needed for muscle spasms.   30 tablet   0   . DULoxetine (CYMBALTA) 60 MG capsule   Oral   Take 120 mg by mouth daily.         . fluticasone (FLONASE) 50 MCG/ACT nasal spray   Each Nare   Place 2 sprays into both nostrils daily.   16 g   2   . insulin lispro (HUMALOG) 100 UNIT/ML injection   Subcutaneous   Inject into the skin 3 (three) times daily as needed for high blood sugar. Sliding Scale         . insulin NPH Human (HUMULIN N,NOVOLIN N) 100 UNIT/ML injection   Subcutaneous   Inject into the skin. 75 units in the am, 50 units in at lunch, 75 units at night         . lamoTRIgine (LAMICTAL) 150 MG tablet   Oral   Take 150 mg by mouth daily.          Marland Kitchen levothyroxine (SYNTHROID, LEVOTHROID) 75 MCG tablet      TAKE 1 TABLET BY MOUTH EVERY DAY.   30 tablet   11   . LINZESS 145 MCG CAPS capsule                 Dispense as written.   . magnesium oxide (MAG-OX) 400 MG tablet   Oral   Take 1 tablet (400 mg total) by mouth 3 (three) times daily.   90 tablet   2   . metoprolol tartrate (LOPRESSOR) 25 MG tablet      TAKE 1/2 TABLET BY MOUTH TWICE A DAY   30 tablet   5   . Multiple Vitamin (MULTIVITAMIN) capsule   Oral   Take by mouth.          . oseltamivir (TAMIFLU) 75 MG capsule   Oral   Take 1 capsule (75 mg total) by mouth 2 (two) times daily.   10 capsule  0   . pantoprazole (PROTONIX) 40 MG tablet   Oral   Take 1 tablet (40 mg total) by mouth daily.   31 tablet   12   . promethazine-dextromethorphan (PROMETHAZINE-DM) 6.25-15 MG/5ML syrup   Oral   Take 5 mLs by mouth 4 (four) times daily as needed for cough.   118 mL   0   . QUEtiapine (SEROQUEL) 25 MG tablet   Oral   Take 25 mg by mouth every morning. Take one in am, one at lunch         . QUEtiapine (SEROQUEL) 400 MG tablet   Oral   Take 800 mg by mouth at bedtime. Take 2 tabs at bedtime         . REXULTI 1 MG TABS   Oral   Take 1 mg by mouth.           Dispense as written.   . topiramate (TOPAMAX) 25 MG tablet   Oral   Take 1 tablet (25 mg total) by mouth 2 (two) times daily.   120 tablet   3   . zolpidem (AMBIEN) 10 MG tablet   Oral   Take 10 mg by mouth.           Allergies Morphine and related; Codeine; Codeine; Hydrocodone-acetaminophen; Mirtazapine; Morphine and related; Sulfamethoxazole-trimethoprim; and Vicodin  Family History  Problem Relation Age of Onset  . Heart disease Maternal Grandfather   . Rheum arthritis Maternal Grandfather   . Cancer Maternal Grandfather     liver  . Cancer Paternal Grandmother     lung  . Diabetes Mother   . Heart disease Mother   . Hyperlipidemia Mother   . Hypertension Mother   . Kidney disease Mother   . Mental illness Mother   . Hypothyroidism Mother   . Stroke Mother   . Osteoporosis Mother   . Glaucoma Mother   . Hypertension Father   . Asthma Daughter   . Cancer Daughter     throat    Social History Social History  Substance Use Topics  . Smoking status: Never Smoker   . Smokeless tobacco: Never Used  . Alcohol Use: No     Review of Systems  Constitutional: Positive fever/chills, fatigue.  Eyes: No visual changes. No discharge ENT: Positive post nasal drip.  No sore throat, ear pain, sinus pressure, congestion. Cardiovascular: No chest pain. Respiratory: No cough. No shortness of breath. No wheezing.  Gastrointestinal: Some nausea but no vomiting or abdominal pain. No diarrhea.  No constipation. Genitourinary: Negative for dysuria. No hematuria. No urinary hesitancy, urgency or increased frequency. Musculoskeletal: Negative for back, neck pain.  Skin: Negative for rash. Neurological: Negative for headaches, focal weakness or numbness. 10-point ROS otherwise negative.  ____________________________________________   PHYSICAL EXAM:  VITAL SIGNS: ED Triage Vitals  Enc Vitals Group     BP 10/22/15 1817 134/81 mmHg     Pulse Rate 10/22/15 1817 111     Resp 10/22/15 1817 20     Temp 10/22/15 1817 98.7 F (37.1 C)     Temp Source 10/22/15 1817 Oral     SpO2 10/22/15 1817 96 %     Weight --      Height --      Head Cir --      [place] Flow --      Pain Score 10/22/15 1813 6     Pain Loc --      Pain Edu? --  Excl. in [place]? --     Constitutional: Alert and oriented. Well appearing and in no acute distress. Eyes: Conjunctivae are normal. PERRL. EOMI without pain.  Head: Atraumatic. ENT:      Ears: TMs visualized bilaterally without erythema, bulging, effusion, perforation. Bilateral external ear canals without erythema, swelling, discharge.      Nose: No congestion/rhinnorhea.      Mouth/Throat: Mucous membranes are moist. Pharynx without erythema, swelling. Mild clear postnasal drip noted. Neck: No stridor. No cervical spine tenderness to palpation. Supple with full range of motion. Hematological/Lymphatic/Immunilogical: No cervical lymphadenopathy. Cardiovascular: Normal rate, regular rhythm. Normal S1 and S2.  Good peripheral circulation. Respiratory: Normal respiratory effort without tachypnea or retractions. Lungs CTAB. Neurologic:  Normal speech and language. No gross focal neurologic deficits are appreciated.  Skin:  Skin is  warm, dry and intact. No rash noted. Turgor is normal Psychiatric: Mood and affect are normal. Speech and behavior are normal. Patient exhibits appropriate insight and judgement.   ____________________________________________   LABS  None  ____________________________________________  EKG  None ____________________________________________  RADIOLOGY  none ____________________________________________    PROCEDURES  Procedure(s) performed: None    Medications - No data to display   ____________________________________________   INITIAL IMPRESSION / ASSESSMENT AND PLAN / ED COURSE  Patient's diagnosis is consistent with influenza. Due to patient's close contact with confirmed positive cases of influenza I thought it best to preemptively treat the patient. She was in agreement with such. Patient will be discharged home with prescriptions for Tamiflu 75 mg take one tablet by mouth twice daily for 5 days as well as promethazine DM syrup to take 1 teaspoon by mouth every 6 hours as needed for nausea or cough. Patient is to follow up with her primary care provider if symptoms persist past this treatment course. Patient is given ED precautions to return to the ED for any worsening or new symptoms.    ____________________________________________  FINAL CLINICAL IMPRESSION(S) / ED DIAGNOSES  Final diagnoses:  Influenza  Nausea      NEW MEDICATIONS STARTED DURING THIS VISIT:  New Prescriptions   OSELTAMIVIR (TAMIFLU) 75 MG CAPSULE    Take 1 capsule (75 mg total) by mouth 2 (two) times daily.   PROMETHAZINE-DEXTROMETHORPHAN (PROMETHAZINE-DM) 6.25-15 MG/5ML SYRUP    Take 5 mLs by mouth 4 (four) times daily as needed for cough.         Braxton Feathers, PA-C 10/22/15 1856  Carrie Mew, MD 10/24/15 [phone]

## 2015-10-22 NOTE — ED Notes (Signed)
Patient states both daughters and mother have been diagnosed with the flu this week.

## 2015-10-22 NOTE — ED Notes (Signed)
Pt to ed via acems with flu like symptoms. Ems reports all vss. No acute distress noted at time of arrival.

## 2015-10-25 ENCOUNTER — Emergency Department
Admission: EM | Admit: 2015-10-25 | Discharge: 2015-10-25 | Disposition: A | Discharge status: Home / Self Care | Payer: Medicare Other | Attending: Emergency Medicine | Admitting: Emergency Medicine

## 2015-10-25 ENCOUNTER — Encounter: Payer: Self-pay | Admitting: *Deleted

## 2015-10-25 DIAGNOSIS — H109 Unspecified conjunctivitis: Secondary | ICD-10-CM | POA: Diagnosis not present

## 2015-10-25 DIAGNOSIS — I129 Hypertensive chronic kidney disease with stage 1 through stage 4 chronic kidney disease, or unspecified chronic kidney disease: Secondary | ICD-10-CM | POA: Insufficient documentation

## 2015-10-25 DIAGNOSIS — Z79899 Other long term (current) drug therapy: Secondary | ICD-10-CM | POA: Diagnosis not present

## 2015-10-25 DIAGNOSIS — Z7951 Long term (current) use of inhaled steroids: Secondary | ICD-10-CM | POA: Insufficient documentation

## 2015-10-25 DIAGNOSIS — N183 Chronic kidney disease, stage 3 (moderate): Secondary | ICD-10-CM | POA: Diagnosis not present

## 2015-10-25 DIAGNOSIS — Z794 Long term (current) use of insulin: Secondary | ICD-10-CM | POA: Insufficient documentation

## 2015-10-25 DIAGNOSIS — E1140 Type 2 diabetes mellitus with diabetic neuropathy, unspecified: Secondary | ICD-10-CM | POA: Diagnosis not present

## 2015-10-25 DIAGNOSIS — H5712 Ocular pain, left eye: Secondary | ICD-10-CM | POA: Diagnosis present

## 2015-10-25 MED ORDER — CIPROFLOXACIN HCL 0.3 % OP SOLN: 2.0000 [drp] | OPHTHALMIC | Status: AC

## 2015-10-25 MED ORDER — CIPROFLOXACIN HCL 0.3 % OP SOLN
OPHTHALMIC | Status: AC
  Filled 2015-10-25: qty 2.5

## 2015-10-25 MED ORDER — CIPROFLOXACIN HCL 0.3 % OP SOLN
2.0000 [drp] | OPHTHALMIC | Status: DC
  Administered 2015-10-25: 2 [drp] via OPHTHALMIC

## 2015-10-25 NOTE — ED Notes (Signed)
Pt left eye is red and irritated , pt denies any other symptoms

## 2015-10-25 NOTE — Discharge Instructions (Signed)
Bacterial Conjunctivitis Bacterial conjunctivitis, commonly called pink eye, is an inflammation of the clear membrane that covers the white part of the eye (conjunctiva). The inflammation can also happen on the underside of the eyelids. The blood vessels in the conjunctiva become inflamed, causing the eye to become red or pink. Bacterial conjunctivitis may spread easily from one eye to another and from person to person (contagious).  CAUSES  Bacterial conjunctivitis is caused by bacteria. The bacteria may come from your own skin, your upper respiratory tract, or from someone else with bacterial conjunctivitis. SYMPTOMS  The normally white color of the eye or the underside of the eyelid is usually pink or red. The pink eye is usually associated with irritation, tearing, and some sensitivity to light. Bacterial conjunctivitis is often associated with a thick, yellowish discharge from the eye. The discharge may turn into a crust on the eyelids overnight, which causes your eyelids to stick together. If a discharge is present, there may also be some blurred vision in the affected eye. DIAGNOSIS  Bacterial conjunctivitis is diagnosed by your caregiver through an eye exam and the symptoms that you report. Your caregiver looks for changes in the surface tissues of your eyes, which may point to the specific type of conjunctivitis. A sample of any discharge may be collected on a cotton-tip swab if you have a severe case of conjunctivitis, if your cornea is affected, or if you keep getting repeat infections that do not respond to treatment. The sample will be sent to a lab to see if the inflammation is caused by a bacterial infection and to see if the infection will respond to antibiotic medicines. TREATMENT   Bacterial conjunctivitis is treated with antibiotics. Antibiotic eyedrops are most often used. However, antibiotic ointments are also available. Antibiotics pills are sometimes used. Artificial tears or eye  washes may ease discomfort. HOME CARE INSTRUCTIONS   To ease discomfort, apply a cool, clean washcloth to your eye for 10-20 minutes, 3-4 times a day.  Gently wipe away any drainage from your eye with a warm, wet washcloth or a cotton ball.  Wash your hands often with soap and water. Use paper towels to dry your hands.  Do not share towels or washcloths. This may spread the infection.  Change or wash your pillowcase every day.  You should not use eye makeup until the infection is gone.  Do not operate machinery or drive if your vision is blurred.  Stop using contact lenses. Ask your caregiver how to sterilize or replace your contacts before using them again. This depends on the type of contact lenses that you use.  When applying medicine to the infected eye, do not touch the edge of your eyelid with the eyedrop bottle or ointment tube. SEEK IMMEDIATE MEDICAL CARE IF:   Your infection has not improved within 3 days after beginning treatment.  You had yellow discharge from your eye and it returns.  You have increased eye pain.  Your eye redness is spreading.  Your vision becomes blurred.  You have a fever or persistent symptoms for more than 2-3 days.  You have a fever and your symptoms suddenly get worse.  You have facial pain, redness, or swelling. MAKE SURE YOU:   Understand these instructions.  Will watch your condition.  Will get help right away if you are not doing well or get worse.   This information is not intended to replace advice given to you by your health care provider. Make sure you   discuss any questions you have with your health care provider.   Document Released: 08/21/2005 Document Revised: 09/11/2014 Document Reviewed: 01/22/2012 Elsevier Interactive Patient Education 2016 [employer]  

## 2015-10-25 NOTE — ED Provider Notes (Signed)
[facility] Emergency Department Provider Note ____________________________________________  Time seen: Approximately 4:45 PM  I have reviewed the triage vital signs and the nursing notes.   HISTORY  Chief Complaint Eye Pain   HPI [name] is a 50 y.o. female who presents to the emergency department for evaluation of left eye irritation and drainage. Upon awakening, the eye was matted and had a yellow crust. She states it feels irritated and as if something is in the eye. She denies injury. She does not wear contact lenses.   Past Medical History  Diagnosis Date  . High blood pressure   . Heart attack ([place])   . Irregular heart rhythm   . Seasonal allergies   . Chronic headaches   . Sleep apnea   . Hyperlipidemia   . COPD (chronic obstructive pulmonary disease) ([place])   . Asthma   . Hypothyroid   . Morbid obesity ([place])   . OSA (obstructive sleep apnea)   . RLS (restless legs syndrome)   . GERD (gastroesophageal reflux disease)   . Bipolar depression ([place])   . Vitamin D deficiency   . Urinary incontinence   . Hypoxemia   . Liver lesion     Favored to be benign on CT; MRI of abd w/contract in Aug 2016. Liver Biopsy-Negative, March 2016  . Acute renal failure ([place])   . Hypomagnesemia   . Hypertension   . Headache   . Diabetes mellitus without complication (HCC)     Type 2, insulin dependent. Patient also takes Metformin.  . CHF (congestive heart failure) ([place])   . Diabetic neuropathy ([place])   . Pinched nerve     Back    Patient Active Problem List   Diagnosis Date Noted  . Adenomatous colon polyp 10/15/2015  . Absolute anemia 10/15/2015  . Airway hyperreactivity 10/15/2015  . Bipolar affective disorder ([place]) 10/15/2015  . H/O manic depressive disorder 10/15/2015  . CN (constipation) 10/15/2015  . CAFL (chronic airflow limitation) ([place]) 10/15/2015  . H/O diabetes mellitus 10/15/2015  . Drug-induced hepatic toxicity 10/15/2015  .  Blood in feces 10/15/2015  . HLD (hyperlipidemia) 10/15/2015  . Hyperthyroidism 10/15/2015  . Hypoxemia 10/15/2015  . Lesion of liver 10/15/2015  . Feeling bilious 10/15/2015  . NASH (nonalcoholic steatohepatitis) 10/15/2015  . Adiposity 10/15/2015  . Restless leg syndrome 10/15/2015  . Type 2 diabetes mellitus ([place]) 10/15/2015  . Severe obstructive sleep apnea 10/15/2015  . Elevated alkaline phosphatase level 09/22/2015  . Urinary bladder incontinence 09/22/2015  . Diarrhea 08/18/2015  . Low back pain with left-sided sciatica 08/18/2015  . Skin infection 08/18/2015  . Encounter for monitoring tricyclic antidepressant therapy 05/13/2015  . Bipolar disorder ([place]) 05/11/2015  . Liver disease 05/11/2015  . Diabetes ([place]) 05/11/2015  . Apnea, sleep 05/05/2015  . Heart valve disease 05/05/2015  . Urinary tract infection 03/24/2015  . Chronic kidney disease, stage III (moderate) 03/15/2015  . Anemia 03/15/2015  . Migraine 03/12/2015  . COPD (chronic obstructive pulmonary disease) ([place]) 01/20/2015  . 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 GERD (gastroesophageal reflux disease) 01/20/2015  . Disorder of smooth muscle 01/20/2015  . Hepatic encephalopathy ([place]) 01/20/2015  . Hypothyroid   . RLS (restless legs syndrome)   . Vitamin D deficiency   . Urinary incontinence   . Liver lesion   . Acute renal failure ([place])   . Hypomagnesemia   . Obstructive sleep apnea 10/20/2013  . PERIPHERAL NEUROPATHY 03/04/2009  . HEMORRHOIDS, EXTERNAL 09/03/2008  .  ALLERGIC RHINITIS 07/04/2007  . ASTHMA, PERSISTENT, MODERATE 07/04/2007  . INTERSTITIAL CYSTITIS 07/04/2007  . HYPERTRIGLYCERIDEMIA 07/03/2007  . FATIGUE, CHRONIC 07/03/2007  . HYPERTENSION, BENIGN ESSENTIAL 06/19/2007    Past Surgical History  Procedure Laterality Date  . Abdominal hysterectomy    . Tubal ligation    . Liver biopsy  March 2016    Negative  . Abdominal hysterectomy      Partial  . Cholecystectomy    .  Bladder surgery      x 2  . Lung biopsy      Current Outpatient Rx  Name  Route  Sig  Dispense  Refill  . ACCU-CHEK AVIVA PLUS test strip                 Dispense as written.   Marland Kitchen ACCU-CHEK SOFTCLIX LANCETS lancets               . acetaminophen-codeine (TYLENOL #3) 300-30 MG tablet               . Choline Fenofibrate (FENOFIBRIC ACID) 45 MG CPDR      TAKE 1 CAPSULE BY MOUTH EACH DAY   30 capsule   5   . ciprofloxacin (CILOXAN) 0.3 % ophthalmic solution   Right Eye   Place 2 drops into the right eye every 2 (two) hours. Administer 2 drop, every 2 hours, while awake, for 2 days. Then 1 drop, every 4 hours, while awake, for the next 5 days.   5 mL   0   . cyclobenzaprine (FLEXERIL) 10 MG tablet   Oral   Take 1 tablet (10 mg total) by mouth at bedtime as needed for muscle spasms.   30 tablet   0   . DULoxetine (CYMBALTA) 60 MG capsule   Oral   Take 120 mg by mouth daily.         . fluticasone (FLONASE) 50 MCG/ACT nasal spray   Each Nare   Place 2 sprays into both nostrils daily.   16 g   2   . insulin lispro (HUMALOG) 100 UNIT/ML injection   Subcutaneous   Inject into the skin 3 (three) times daily as needed for high blood sugar. Sliding Scale         . insulin NPH Human (HUMULIN N,NOVOLIN N) 100 UNIT/ML injection   Subcutaneous   Inject into the skin. 75 units in the am, 50 units in at lunch, 75 units at night         . lamoTRIgine (LAMICTAL) 150 MG tablet   Oral   Take 150 mg by mouth daily.          Marland Kitchen levothyroxine (SYNTHROID, LEVOTHROID) 75 MCG tablet      TAKE 1 TABLET BY MOUTH EVERY DAY.   30 tablet   11   . LINZESS 145 MCG CAPS capsule                 Dispense as written.   . magnesium oxide (MAG-OX) 400 MG tablet   Oral   Take 1 tablet (400 mg total) by mouth 3 (three) times daily.   90 tablet   2   . metoprolol tartrate (LOPRESSOR) 25 MG tablet      TAKE 1/2 TABLET BY MOUTH TWICE A DAY   30 tablet   5   .  Multiple Vitamin (MULTIVITAMIN) capsule   Oral   Take by mouth.         . oseltamivir (TAMIFLU) 75  MG capsule   Oral   Take 1 capsule (75 mg total) by mouth 2 (two) times daily.   10 capsule   0   . pantoprazole (PROTONIX) 40 MG tablet   Oral   Take 1 tablet (40 mg total) by mouth daily.   31 tablet   12   . promethazine-dextromethorphan (PROMETHAZINE-DM) 6.25-15 MG/5ML syrup   Oral   Take 5 mLs by mouth 4 (four) times daily as needed for cough.   118 mL   0   . QUEtiapine (SEROQUEL) 25 MG tablet   Oral   Take 25 mg by mouth every morning. Take one in am, one at lunch         . QUEtiapine (SEROQUEL) 400 MG tablet   Oral   Take 800 mg by mouth at bedtime. Take 2 tabs at bedtime         . REXULTI 1 MG TABS   Oral   Take 1 mg by mouth.           Dispense as written.   . topiramate (TOPAMAX) 25 MG tablet   Oral   Take 1 tablet (25 mg total) by mouth 2 (two) times daily.   120 tablet   3   . zolpidem (AMBIEN) 10 MG tablet   Oral   Take 10 mg by mouth.           Allergies Morphine and related; Codeine; Codeine; Hydrocodone-acetaminophen; Mirtazapine; Morphine and related; Sulfamethoxazole-trimethoprim; and Vicodin  Family History  Problem Relation Age of Onset  . Heart disease Maternal Grandfather   . Rheum arthritis Maternal Grandfather   . Cancer Maternal Grandfather     liver  . Cancer Paternal Grandmother     lung  . Diabetes Mother   . Heart disease Mother   . Hyperlipidemia Mother   . Hypertension Mother   . Kidney disease Mother   . Mental illness Mother   . Hypothyroidism Mother   . Stroke Mother   . Osteoporosis Mother   . Glaucoma Mother   . Hypertension Father   . Asthma Daughter   . Cancer Daughter     throat    Social History Social History  Substance Use Topics  . Smoking status: Never Smoker   . Smokeless tobacco: Never Used  . Alcohol Use: No    Review of Systems   Constitutional: No fever/chills Eyes: Visual  changes: no. ENT: No sore throat. Cardiovascular: Negative for complaint. Respiratory: Negative for cough or dyspnea. Gastrointestinal: No abdominal pain.  No nausea, no vomiting.  No diarrhea.  Musculoskeletal: Negative for pain. Skin: Negative for rash. Neurological: Negative for headaches, focal weakness or numbness. Psychiatric:At baseline, no complaint Lymphatic:Swollen nodes-- no Allergic: Seasonal allergies: no 10-point ROS otherwise negative.  ____________________________________________  PHYSICAL EXAM:  VITAL SIGNS: ED Triage Vitals  Enc Vitals Group     BP 10/25/15 1621 146/67 mmHg     Pulse Rate 10/25/15 1621 105     Resp 10/25/15 1621 20     Temp 10/25/15 1621 98.3 F (36.8 C)     Temp Source 10/25/15 1621 Oral     SpO2 10/25/15 1621 98 %     Weight --      Height --      Head Cir --      Peak Flow --      Pain Score --      Pain Loc --      Pain Edu? --  Excl. in [place]? --     Constitutional: Alert and oriented. Well appearing and in no acute distress. Eyes: Visual acuity--see nursing documentation; No globe trauma; Eyelids normal to inspection; Everted for exam yes; Conjunctiva and sclera: injected and erythematous to the limbus; Corneas: normal on visual exam; Examined with fluorescein no; EOM's intact; Pupils PERRL; Anterior Chambers normal with limited exam.  Head: Atraumatic. Nose: No congestion/rhinnorhea. Mouth/Throat: Mucous membranes are moist.  Oropharynx non-erythematous. Neck: No stridor.  Cardiovascular: Good peripheral circulation; capillary refill <3 seconds. Respiratory: Normal respiratory effort.  No retractions. Musculoskeletal:Normal ROM x 4 extremities. Neurologic:  Normal speech and language. No gross focal neurologic deficits are appreciated. Speech is normal. No gait instability. Skin:  Skin is warm, dry and intact. No rash noted. Psychiatric: Mood and affect are normal. Speech and behavior are  normal.  ____________________________________________   LABS (all labs ordered are listed, but only abnormal results are displayed)  Labs Reviewed - No data to display ____________________________________________  EKG   ____________________________________________  RADIOLOGY   ____________________________________________   PROCEDURES  Procedure(s) performed: None  ____________________________________________   INITIAL IMPRESSION / ASSESSMENT AND PLAN / ED COURSE  Pertinent labs & imaging results that were available during my care of the patient were reviewed by me and considered in my medical decision making (see chart for details).  Patient was advised to follow up with her opthalmologist for symptoms that are not improving over the next 2 days. She was  also advised to return to the ER for symptoms that change or worsen if unable to schedule an appointment.  ____________________________________________   FINAL CLINICAL IMPRESSION(S) / ED DIAGNOSES  Final diagnoses:  Conjunctivitis of left eye       Victorino Dike, FNP 10/26/15 1341  Nena Polio, MD 11/04/15 2332

## 2015-10-25 NOTE — ED Notes (Signed)
Left eye is red and irritated for couple of days

## 2015-10-26 ENCOUNTER — Ambulatory Visit: Admission: RE | Admit: 2015-10-26 | Payer: Medicare Other | Source: Ambulatory Visit

## 2015-10-28 ENCOUNTER — Telehealth: Payer: Self-pay | Admitting: Family Medicine

## 2015-10-28 NOTE — Telephone Encounter (Signed)
Pt stated that she has been diagnosed with pink eye but they are still burning and itching and she would like to know if there is anything she can do or take to help.

## 2015-10-28 NOTE — Telephone Encounter (Signed)
Routing to provider for advice.

## 2015-10-28 NOTE — Telephone Encounter (Signed)
Most cases of pink eye are viral, so they have to run their course I have printed out a hand-out (front Therapist, nutritional)

## 2015-10-29 ENCOUNTER — Other Ambulatory Visit: Payer: Self-pay

## 2015-10-29 MED ORDER — MAGNESIUM OXIDE 400 MG PO TABS: 400.0000 mg | ORAL_TABLET | Freq: Three times a day (TID) | ORAL | Status: DC

## 2015-10-29 NOTE — Telephone Encounter (Signed)
Called and let patient know what [name] said. Patient asked for me to mail the handout to her that [name] had printed off so I will send it to her.

## 2015-10-29 NOTE — Telephone Encounter (Signed)
Pharmacy: [facility] Rx Number: [id number] Last Visit: 09/22/2015  Request for Magnesium Oxide 424m tablet #90.

## 2015-10-29 NOTE — Telephone Encounter (Signed)
rx approved

## 2015-11-02 ENCOUNTER — Other Ambulatory Visit: Payer: Self-pay

## 2015-11-02 DIAGNOSIS — M25569 Pain in unspecified knee: Secondary | ICD-10-CM

## 2015-11-02 DIAGNOSIS — G441 Vascular headache, not elsewhere classified: Secondary | ICD-10-CM

## 2015-11-02 MED ORDER — TOPIRAMATE 25 MG PO TABS: 25.0000 mg | ORAL_TABLET | Freq: Two times a day (BID) | ORAL | Status: DC

## 2015-11-03 ENCOUNTER — Other Ambulatory Visit: Payer: Self-pay

## 2015-11-03 NOTE — Telephone Encounter (Signed)
Routing to provider for refills.

## 2015-11-04 MED ORDER — FENOFIBRIC ACID 45 MG PO CPDR: 1.0000 | DELAYED_RELEASE_CAPSULE | Freq: Every day | ORAL | Status: DC

## 2015-11-04 MED ORDER — METOPROLOL TARTRATE 25 MG PO TABS: 12.5000 mg | ORAL_TABLET | Freq: Two times a day (BID) | ORAL | Status: DC

## 2015-11-04 NOTE — Telephone Encounter (Signed)
Rxs approved

## 2015-11-08 IMAGING — CT CT ABDOMEN AND PELVIS WITHOUT AND WITH CONTRAST
2 of 9 series · 15 of 46 positions shown, 17 images · IV contrast (isovue)
Comparison: CT of the abdomen and pelvis without contrast
02/28/2014.

CLINICAL DATA: Followup evaluation of liver lesion.  Ovarian cyst.

EXAM:
CT ABDOMEN AND PELVIS WITHOUT AND WITH CONTRAST
TECHNIQUE: Multidetector CT imaging of the abdomen and pelvis was performed
following the standard protocol before and following the bolus
administration of intravenous contrast.
CONTRAST:  100 mL of Isovue 370.

[Series 5: liver portal venous · axial · portal-venous · 0.78mm/px · z∈[-440,-48]mm · 12 of 230 slices shown, 14 images]
[im 17/230  soft-tissue]
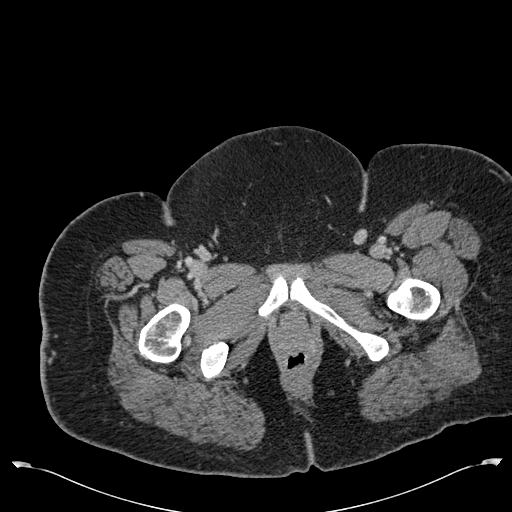
[im 17/230  bone]
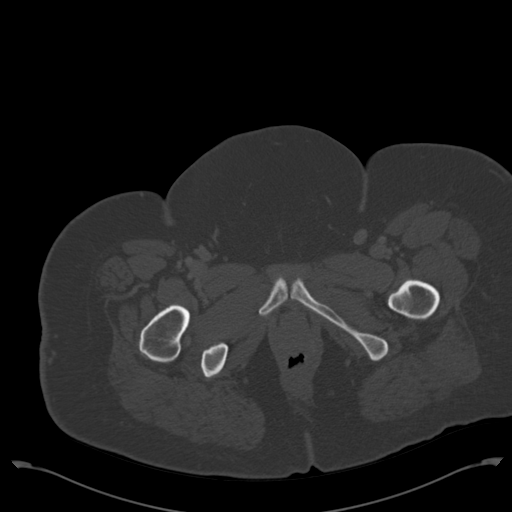
[im 33/230  soft-tissue]
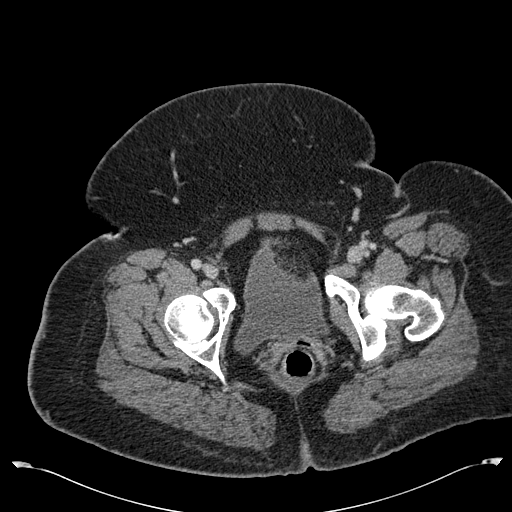
[im 50/230  soft-tissue]
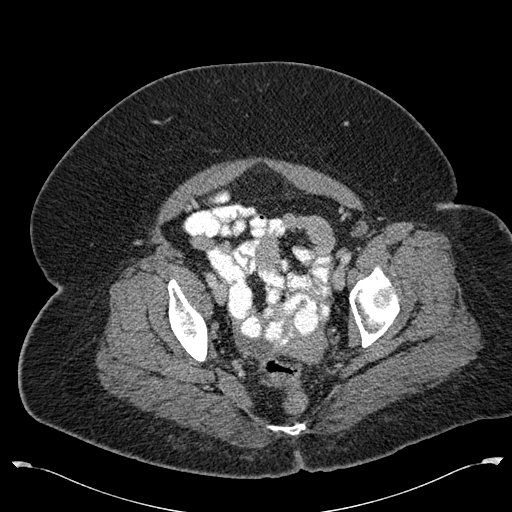
[im 66/230  soft-tissue]
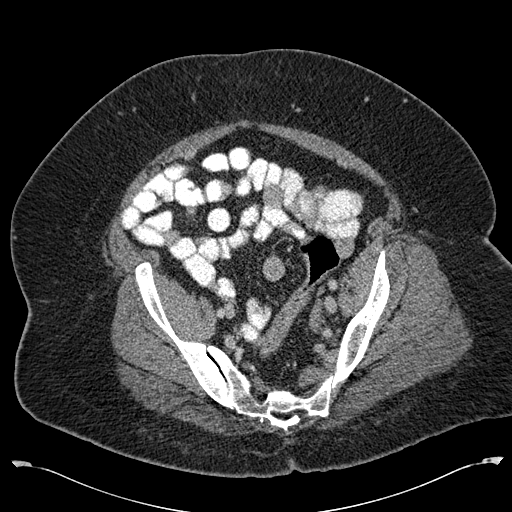
[im 82/230  soft-tissue]
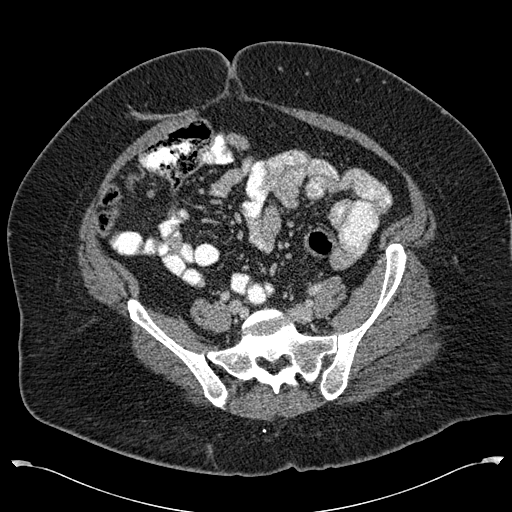
[im 99/230  soft-tissue]
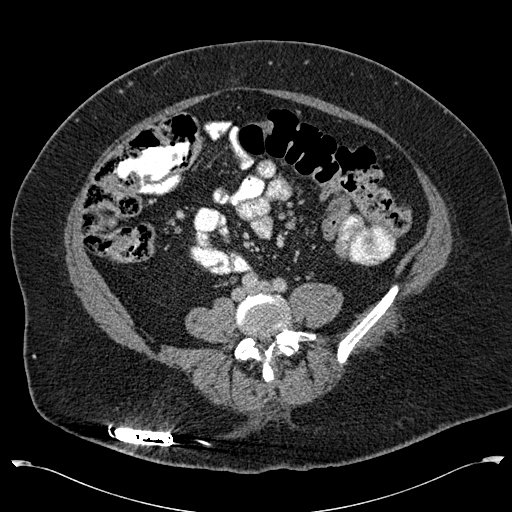
[im 131/230  soft-tissue]
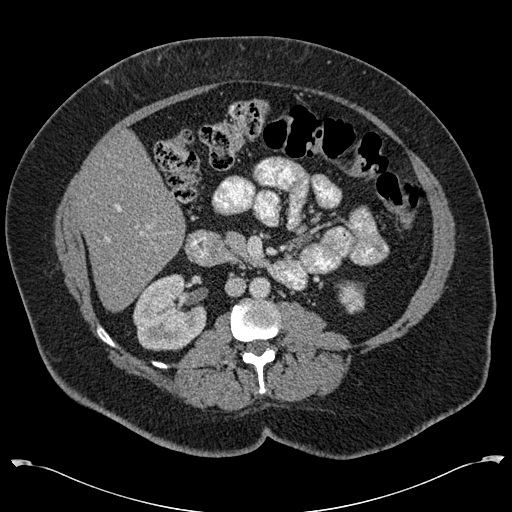
[im 148/230  soft-tissue]
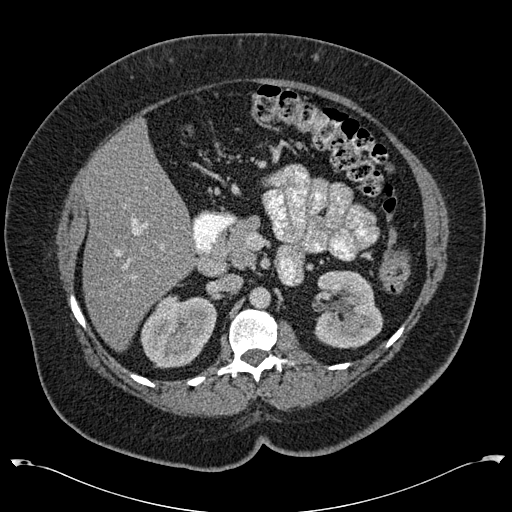
[im 164/230  soft-tissue]
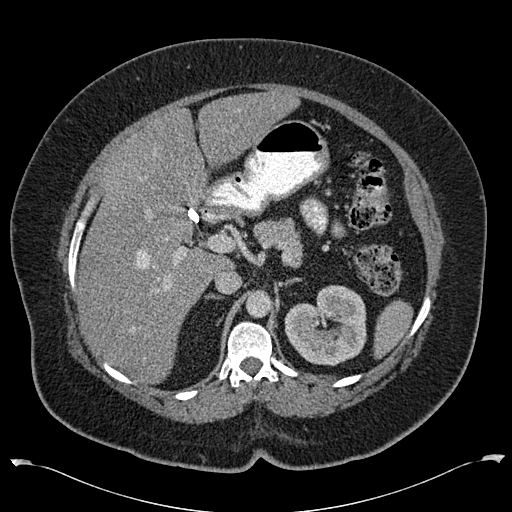
[im 164/230  bone]
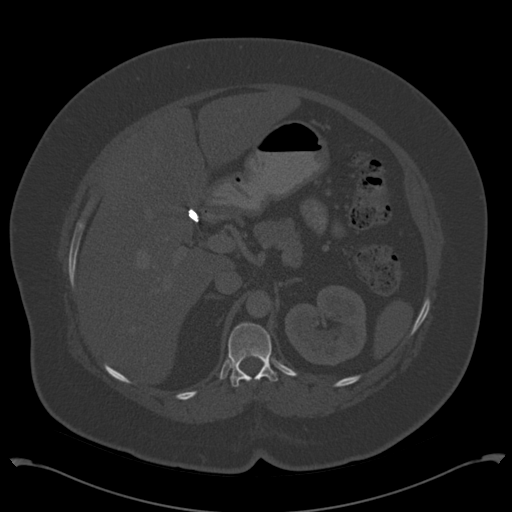
[im 180/230  soft-tissue]
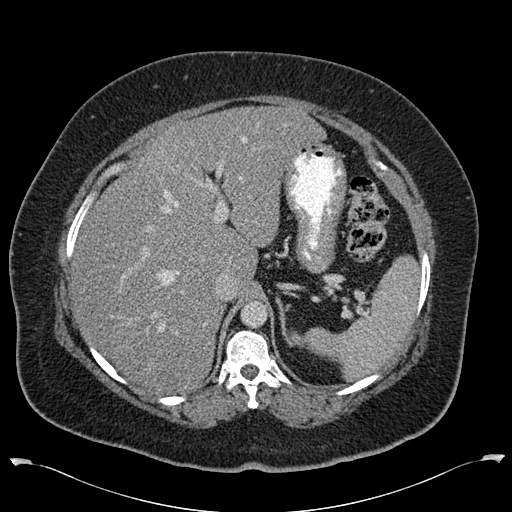
[im 197/230  soft-tissue]
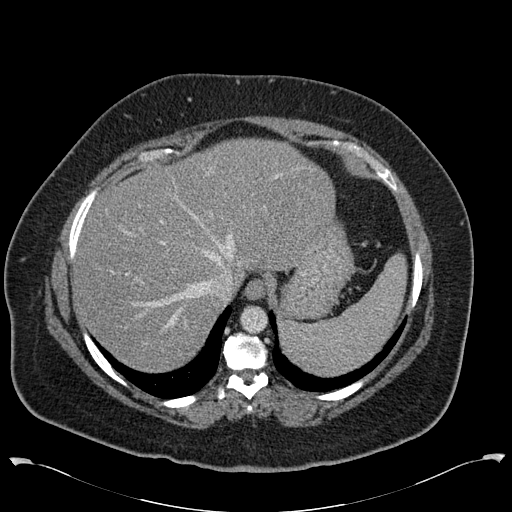
[im 213/230  soft-tissue]
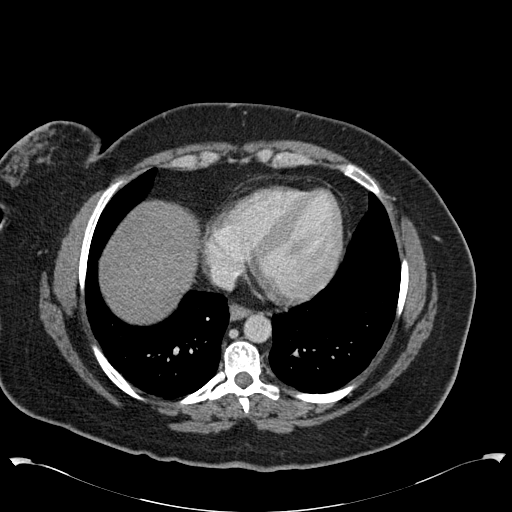

[Series 7: liver arterial cor · coronal · arterial · 0.61mm/px · 3 of 162 slices shown]
[im 41/162  soft-tissue]
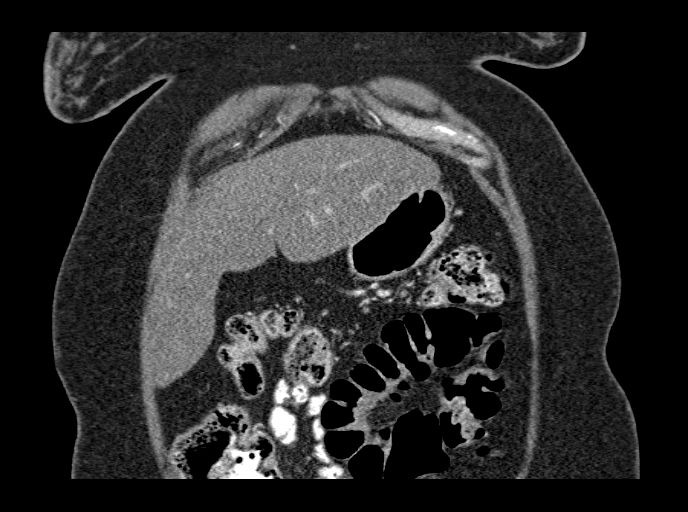
[im 81/162  soft-tissue]
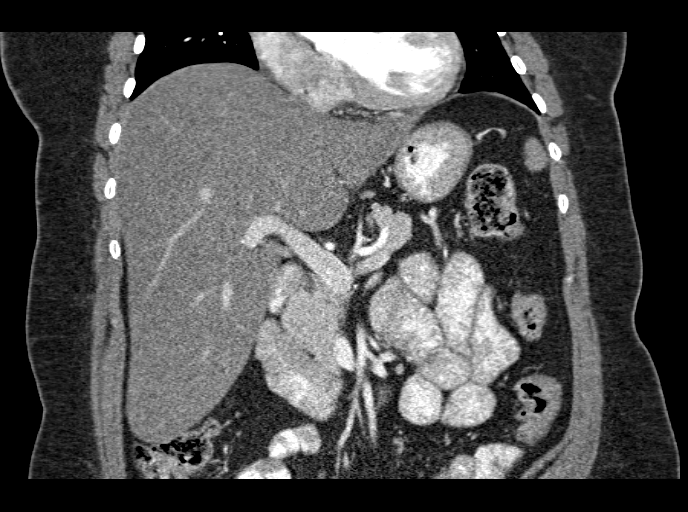
[im 121/162  soft-tissue]
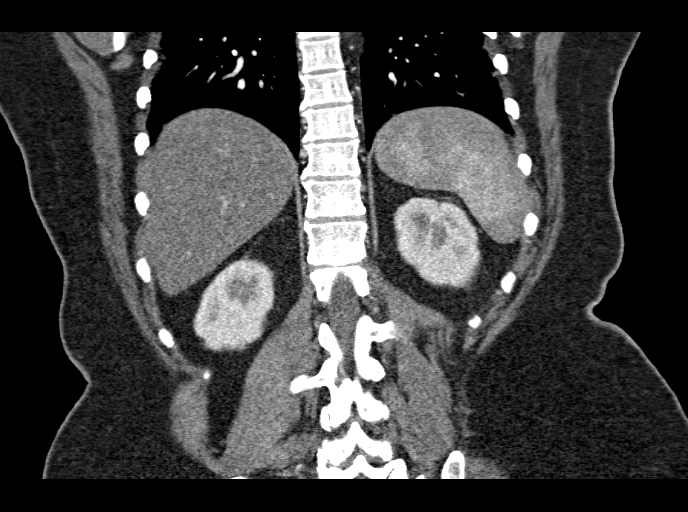

[15 of 46 positions shown; findings below may reference images not displayed]

FINDINGS: Lung Bases: Unremarkable.

Abdomen/Pelvis: Diffuse low attenuation throughout the hepatic
parenchyma is again noted, compatible with severe hepatic steatosis.
In the lateral aspect of segment 2 of the liver there is again a
very subtle area of increased attenuation on the precontrast images,
which appears to correspond to some subtle arterial phase
enhancement (image 34 of series 3) measuring only 8 x 6 mm, which
subsequently becomes less distinct on the portal venous phase
images. This is nonspecific, but favored to represent a tiny
arterioportal shunt or other benign lesion. The liver is enlarged
measuring 24.2 cm in craniocaudal span. No morphologic changes are
noted to suggest cirrhosis at this time. Status post
cholecystectomy.

The spleen is mildly enlarged measuring 13.7 x 7.6 x 9.4 cm
(estimated splenic volume of 489 mL). The appearance of the
pancreas, bilateral adrenal glands and bilateral kidneys is
unremarkable. No significant volume of ascites. No pneumoperitoneum.
No pathologic distention of small bowel. No lymphadenopathy
identified in the abdomen or pelvis. Uterus and ovaries are
unremarkable in appearance (previously noted right adnexal lesion
has resolved). Urinary bladder is normal in appearance. An
electronic device is noted in the right buttock region with lead tip
terminating in the posterior right hemipelvis, presumably a neural
stimulator.

Musculoskeletal: There are no aggressive appearing lytic or blastic
lesions noted in the visualized portions of the skeleton.
IMPRESSION: 1.   1. Hepatic steatosis and hepatomegaly again noted.
2. The tiny lesion of higher attenuation parenchyma in segment 2 of
the liver is similar to the prior study, and because of its small
size is difficult to characterize with CT. This is favored to
represent either a focal area of fatty sparing or a tiny area of
arterioportal shunting. This is strongly favored to be benign, and
could only be definitively characterized with MRI of the abdomen
with and without IV gadolinium. If of clinical concern, an MRI of
the abdomen could be performed in 1 year to ensure the stability of
this finding.
3. Resolution of the previously noted adnexal lesion.
4. Splenomegaly.
5. Additional incidental findings, as above.

## 2015-11-16 ENCOUNTER — Telehealth: Payer: Self-pay | Admitting: Family Medicine

## 2015-11-16 NOTE — Telephone Encounter (Signed)
Pt would like to have eye drops [facility]

## 2015-11-17 NOTE — Telephone Encounter (Signed)
Patient notified

## 2015-11-17 NOTE — Telephone Encounter (Signed)
Routing to  Provider.

## 2015-11-17 NOTE — Telephone Encounter (Signed)
Most cases of pink eye are viral, so antibiotic drops don't work Have her use moist washcloths for symptom relief and to help debride "goo" if needed (don't "rub" eyes) Use an aniti-histamine if needed for itching Pink eye is VERY contagious, so use good hand washing and don't share spoons, cups, towels, etc.

## 2015-11-18 DIAGNOSIS — J449 Chronic obstructive pulmonary disease, unspecified: Secondary | ICD-10-CM | POA: Diagnosis not present

## 2015-11-18 DIAGNOSIS — G4733 Obstructive sleep apnea (adult) (pediatric): Secondary | ICD-10-CM | POA: Diagnosis not present

## 2015-11-18 DIAGNOSIS — R0902 Hypoxemia: Secondary | ICD-10-CM | POA: Diagnosis not present

## 2015-11-19 ENCOUNTER — Ambulatory Visit (INDEPENDENT_AMBULATORY_CARE_PROVIDER_SITE_OTHER): Payer: Medicare Other | Admitting: Family Medicine

## 2015-11-19 ENCOUNTER — Encounter: Payer: Self-pay | Admitting: Family Medicine

## 2015-11-19 VITALS — BP 154/89 | HR 91 | Temp 98.1°F | Ht 59.0 in | Wt 193.0 lb

## 2015-11-19 DIAGNOSIS — F4321 Adjustment disorder with depressed mood: Secondary | ICD-10-CM | POA: Diagnosis not present

## 2015-11-19 DIAGNOSIS — E1165 Type 2 diabetes mellitus with hyperglycemia: Secondary | ICD-10-CM | POA: Diagnosis not present

## 2015-11-19 DIAGNOSIS — K219 Gastro-esophageal reflux disease without esophagitis: Secondary | ICD-10-CM | POA: Diagnosis not present

## 2015-11-19 DIAGNOSIS — J0100 Acute maxillary sinusitis, unspecified: Secondary | ICD-10-CM | POA: Diagnosis not present

## 2015-11-19 DIAGNOSIS — J4520 Mild intermittent asthma, uncomplicated: Secondary | ICD-10-CM

## 2015-11-19 DIAGNOSIS — Z794 Long term (current) use of insulin: Secondary | ICD-10-CM

## 2015-11-19 MED ORDER — CYCLOBENZAPRINE HCL 10 MG PO TABS: 10.0000 mg | ORAL_TABLET | Freq: Three times a day (TID) | ORAL | Status: DC | PRN

## 2015-11-19 MED ORDER — FLUCONAZOLE 150 MG PO TABS: 150.0000 mg | ORAL_TABLET | Freq: Once | ORAL | Status: DC

## 2015-11-19 MED ORDER — RANITIDINE HCL 300 MG PO TABS: 300.0000 mg | ORAL_TABLET | Freq: Every day | ORAL | Status: DC

## 2015-11-19 MED ORDER — AMOXICILLIN-POT CLAVULANATE 875-125 MG PO TABS: 1.0000 | ORAL_TABLET | Freq: Two times a day (BID) | ORAL | Status: AC

## 2015-11-19 MED ORDER — ALBUTEROL SULFATE HFA 108 (90 BASE) MCG/ACT IN AERS: 2.0000 | INHALATION_SPRAY | RESPIRATORY_TRACT | Status: DC | PRN

## 2015-11-19 NOTE — Assessment & Plan Note (Signed)
Pantoprazole not effective; she has used zantac with good results in the past; rx for 300 mg ranitidine sent; avoid triggers; sure that stress playing a role right now

## 2015-11-19 NOTE — Patient Instructions (Addendum)
Do try to de-stress and get some good rest and relaxation to help your stress level and blood pressure Your goal blood pressure is less than 130 mmHg on top and less than 85 on the bottom Try to follow the DASH guidelines (DASH stands for Dietary Approaches to Stop Hypertension) Try to limit the sodium in your diet.  Ideally, consume less than 1.5 grams (less than 1,590m) per day. Do not add salt when cooking or at the table.  Check the sodium amount on labels when shopping, and choose items lower in sodium when given a choice. Avoid or limit foods that already contain a lot of sodium. Eat a diet rich in fruits and vegetables and whole grains. Check your blood pressure at home and call me if over the goal and we'll adjust your medicine Start the new antibiotic Use the fluconazole if needed for yeast infection Start ranitidine for the heartburn Do see the endocrinologist for your sugars [facility] daily or take a probiotic daily for the next month or two We want to replace the healthy germs in the gut If you notice foul, watery diarrhea in the next two months, schedule an appointment RIGHT AWAY

## 2015-11-19 NOTE — Assessment & Plan Note (Signed)
Encouraged patient to see her endocrinologist soon; foot exam by MD today

## 2015-11-19 NOTE — Progress Notes (Signed)
BP 154/89 mmHg  Pulse 91  Temp(Src) 98.1 F (36.7 C)  Ht 4' 11"  (1.499 m)  Wt 193 lb (87.544 kg)  BMI 38.96 kg/m2  SpO2 96%   Subjective:    Patient ID: [name], female    DOB: [birth year], 50 y.o.   MRN: [id number]  HPI: [name] is a 50 y.o. female  Chief Complaint  Patient presents with  . Gastroesophageal Reflux    she states the Pantroprazole doesn't seem to help anymore  . Neuropathy    she'd like a refill on her Flexeril  . Sore Throat    started last night and some right ear pain.   Panic attacks since her mother died 2 weeks; her psychiatrist is helping her with this  Acid reflux has flared; no blood in stool; having some epigastric pain; some belching and burping; pantoprazole just not working any more; zantac has helped in the past; spaghetti and OJ are triggers  Sore throat and right side of neck; asked if breathing problems and allergies, and asked about black mold in her momma's house, in her house 5-6 hours cleaning and then these symptoms started last night; asked for an inhaler; can go without oxygen for only a brief time, drops at time; does not need it when walking around during the day except if she has a cold  She needs flexeril for her hands and feet and legs and arms; she takes one twice a day; if she just takes it at bedtime, the pain is just too bad; does not knock her out   Type 2 diabetes; missed her appt with endo because her mother was hospitalized and then passed away; just forgot; on 75 units of NPH at breakfast, 50 units at lunch, and 75 at night  She missed the appt with the endocrinologist  Relevant past medical, surgical, family and social history reviewed and updated as indicated. Interim medical history since our last visit reviewed. Allergies and medications reviewed and updated.  Review of Systems Per HPI unless specifically indicated above     Objective:    BP 154/89 mmHg  Pulse 91  Temp(Src) 98.1 F (36.7 C)  Ht  4' 11"  (1.499 m)  Wt 193 lb (87.544 kg)  BMI 38.96 kg/m2  SpO2 96%  Wt Readings from Last 3 Encounters:  11/19/15 193 lb (87.544 kg)  10/15/15 188 lb 14.4 oz (85.684 kg)  10/12/15 186 lb 11.2 oz (84.687 kg)    Physical Exam  Constitutional: She appears well-developed and well-nourished.  HENT:  Right Ear: Tympanic membrane and ear canal normal. No drainage. Tympanic membrane is not erythematous and not retracted. No middle ear effusion.  Left Ear: Tympanic membrane and ear canal normal. No drainage. Tympanic membrane is not erythematous and not retracted. Left ear middle ear effusion: cloudy.  Nose: Right sinus exhibits maxillary sinus tenderness. Right sinus exhibits no frontal sinus tenderness. Left sinus exhibits no maxillary sinus tenderness and no frontal sinus tenderness.  Mouth/Throat: Oropharynx is clear and moist and mucous membranes are normal.  Cardiovascular: Normal rate.   Pulmonary/Chest: Effort normal. She has no wheezes.  Abdominal: Normal appearance. She exhibits no distension.  obese  Musculoskeletal: She exhibits no edema.  Neurological: She is alert.  Skin: Skin is warm and dry. No pallor.  Skin on the feet very dry  Psychiatric: She exhibits a depressed mood.  Mildly depressed when discussing her mother's hospitalization and death; good eye contact with examiner  Assessment & Plan:   Problem List Items Addressed This Visit      Respiratory   Airway hyperreactivity    Rx sent for rescue inhaler      Relevant Medications   albuterol (PROVENTIL HFA;VENTOLIN HFA) 108 (90 Base) MCG/ACT inhaler     Digestive   GERD (gastroesophageal reflux disease)    Pantoprazole not effective; she has used zantac with good results in the past; rx for 300 mg ranitidine sent; avoid triggers; sure that stress playing a role right now      Relevant Medications   ranitidine (ZANTAC) 300 MG tablet     Endocrine   Type 2 diabetes mellitus ([place])    Encouraged patient to  see her endocrinologist soon; foot exam by MD today       Other Visit Diagnoses    Acute maxillary sinusitis, recurrence not specified    -  Primary    start antibiotic; diflucan if needed; rest, hydration; stress likely weakened her immune system; C diff cautions in AVS    Relevant Medications    amoxicillin-clavulanate (AUGMENTIN) 875-125 MG tablet    fluconazole (DIFLUCAN) 150 MG tablet    Grief reaction        so sorry for her loss; encouraged stress reduction; we both believe this is the cause of her high BP today; monitor and call if not controlled        Follow up plan: Return in about 1 month (around 12/20/2015) for blood pressure.  Meds ordered this encounter  Medications  . meloxicam (MOBIC) 15 MG tablet    Sig: Take 15 mg by mouth daily.  Marland Kitchen amoxicillin-clavulanate (AUGMENTIN) 875-125 MG tablet    Sig: Take 1 tablet by mouth 2 (two) times daily.    Dispense:  20 tablet    Refill:  0  . fluconazole (DIFLUCAN) 150 MG tablet    Sig: Take 1 tablet (150 mg total) by mouth once.    Dispense:  1 tablet    Refill:  0  . albuterol (PROVENTIL HFA;VENTOLIN HFA) 108 (90 Base) MCG/ACT inhaler    Sig: Inhale 2 puffs into the lungs every 4 (four) hours as needed for wheezing or shortness of breath.    Dispense:  1 Inhaler    Refill:  1  . cyclobenzaprine (FLEXERIL) 10 MG tablet    Sig: Take 1 tablet (10 mg total) by mouth every 8 (eight) hours as needed for muscle spasms.    Dispense:  60 tablet    Refill:  2  . ranitidine (ZANTAC) 300 MG tablet    Sig: Take 1 tablet (300 mg total) by mouth at bedtime.    Dispense:  30 tablet    Refill:  5   An after-visit summary was printed and given to the patient at [facility].  Please see the patient instructions which may contain other information and recommendations beyond what is mentioned above in the assessment and plan.

## 2015-11-19 NOTE — Assessment & Plan Note (Signed)
Rx sent for rescue inhaler

## 2015-11-22 ENCOUNTER — Telehealth: Payer: Self-pay

## 2015-11-22 IMAGING — CR DG CHEST 2V
1 series · 2 of 2 positions shown · non-contrast
Comparison: 10/24/2013.

CLINICAL DATA: Shortness of Breath and chest pain.

EXAM:
CHEST  2 VIEW

[Series 1: pa · 0.17mm/px · 2 of 2 slices shown]
[im 1/2]
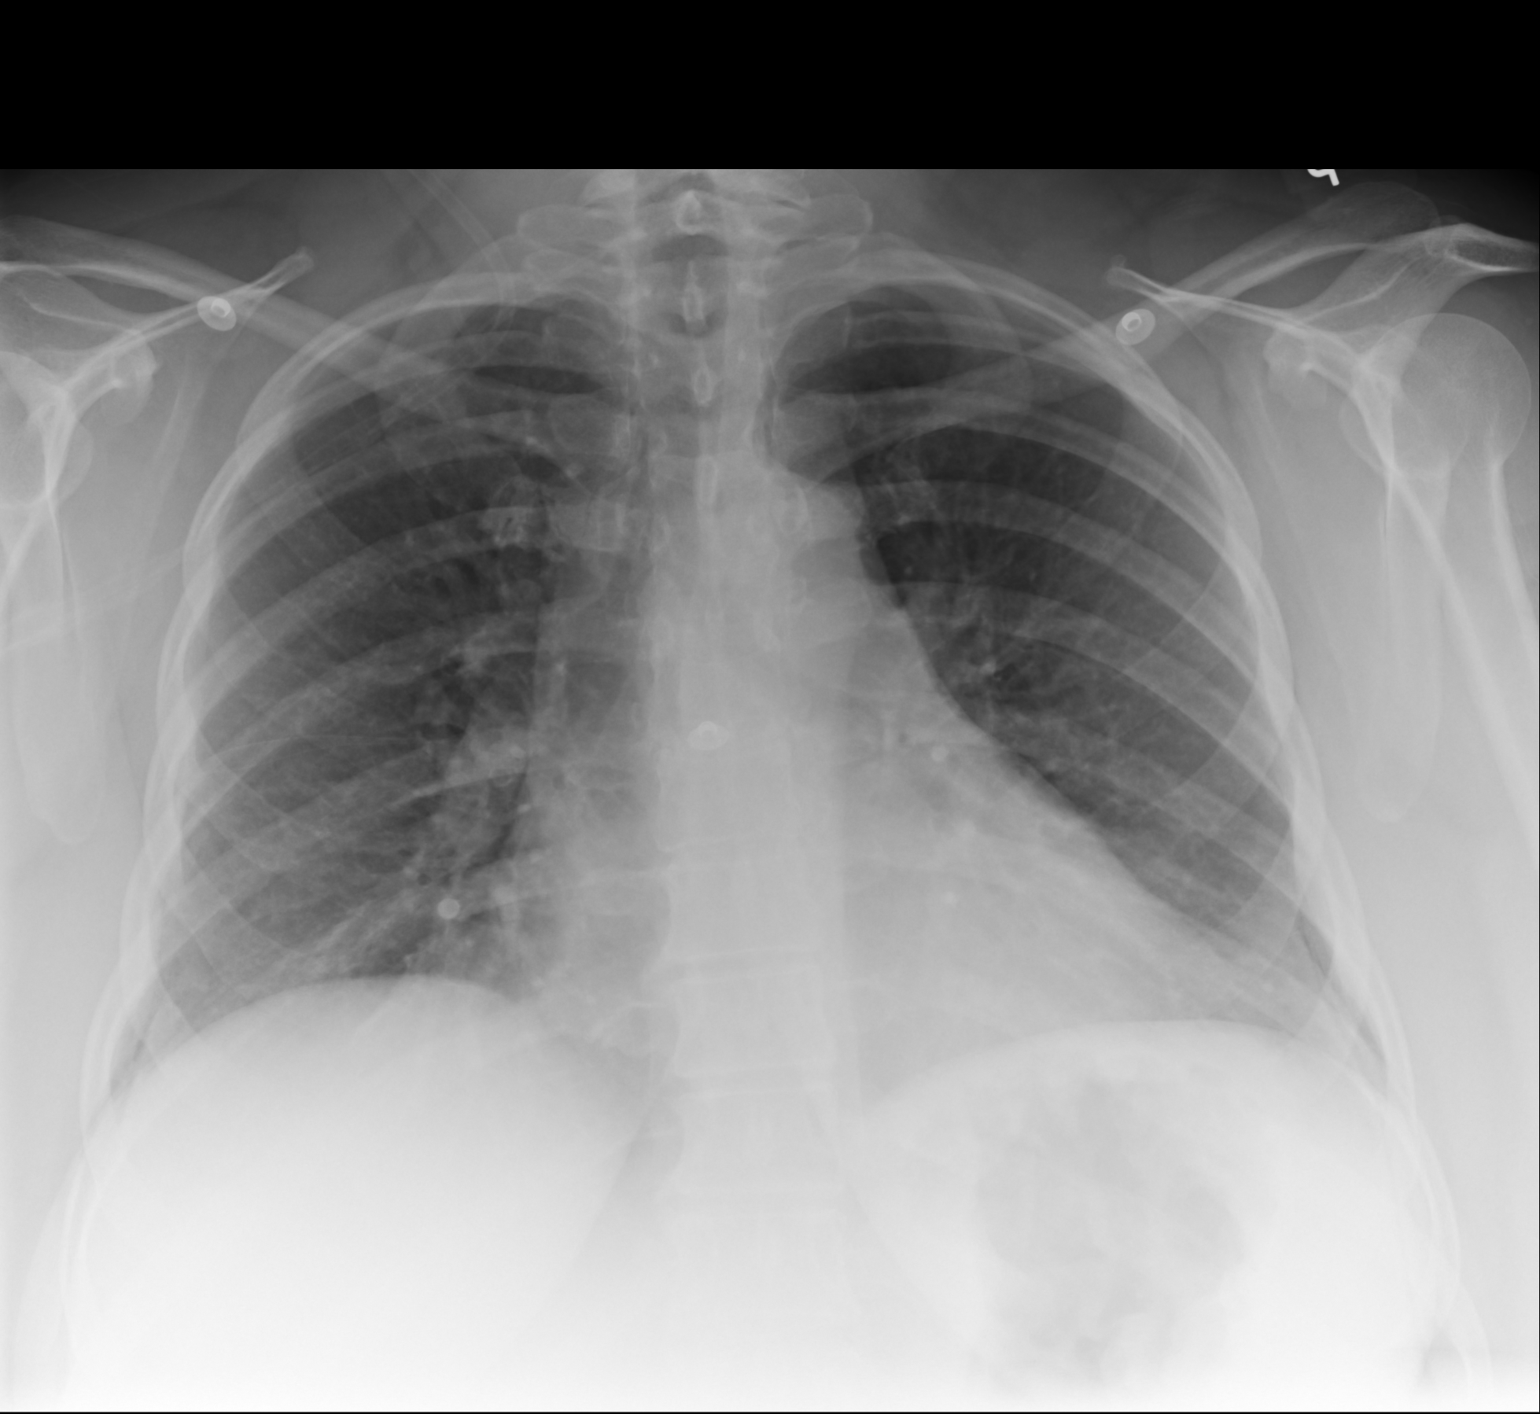
[im 2/2]
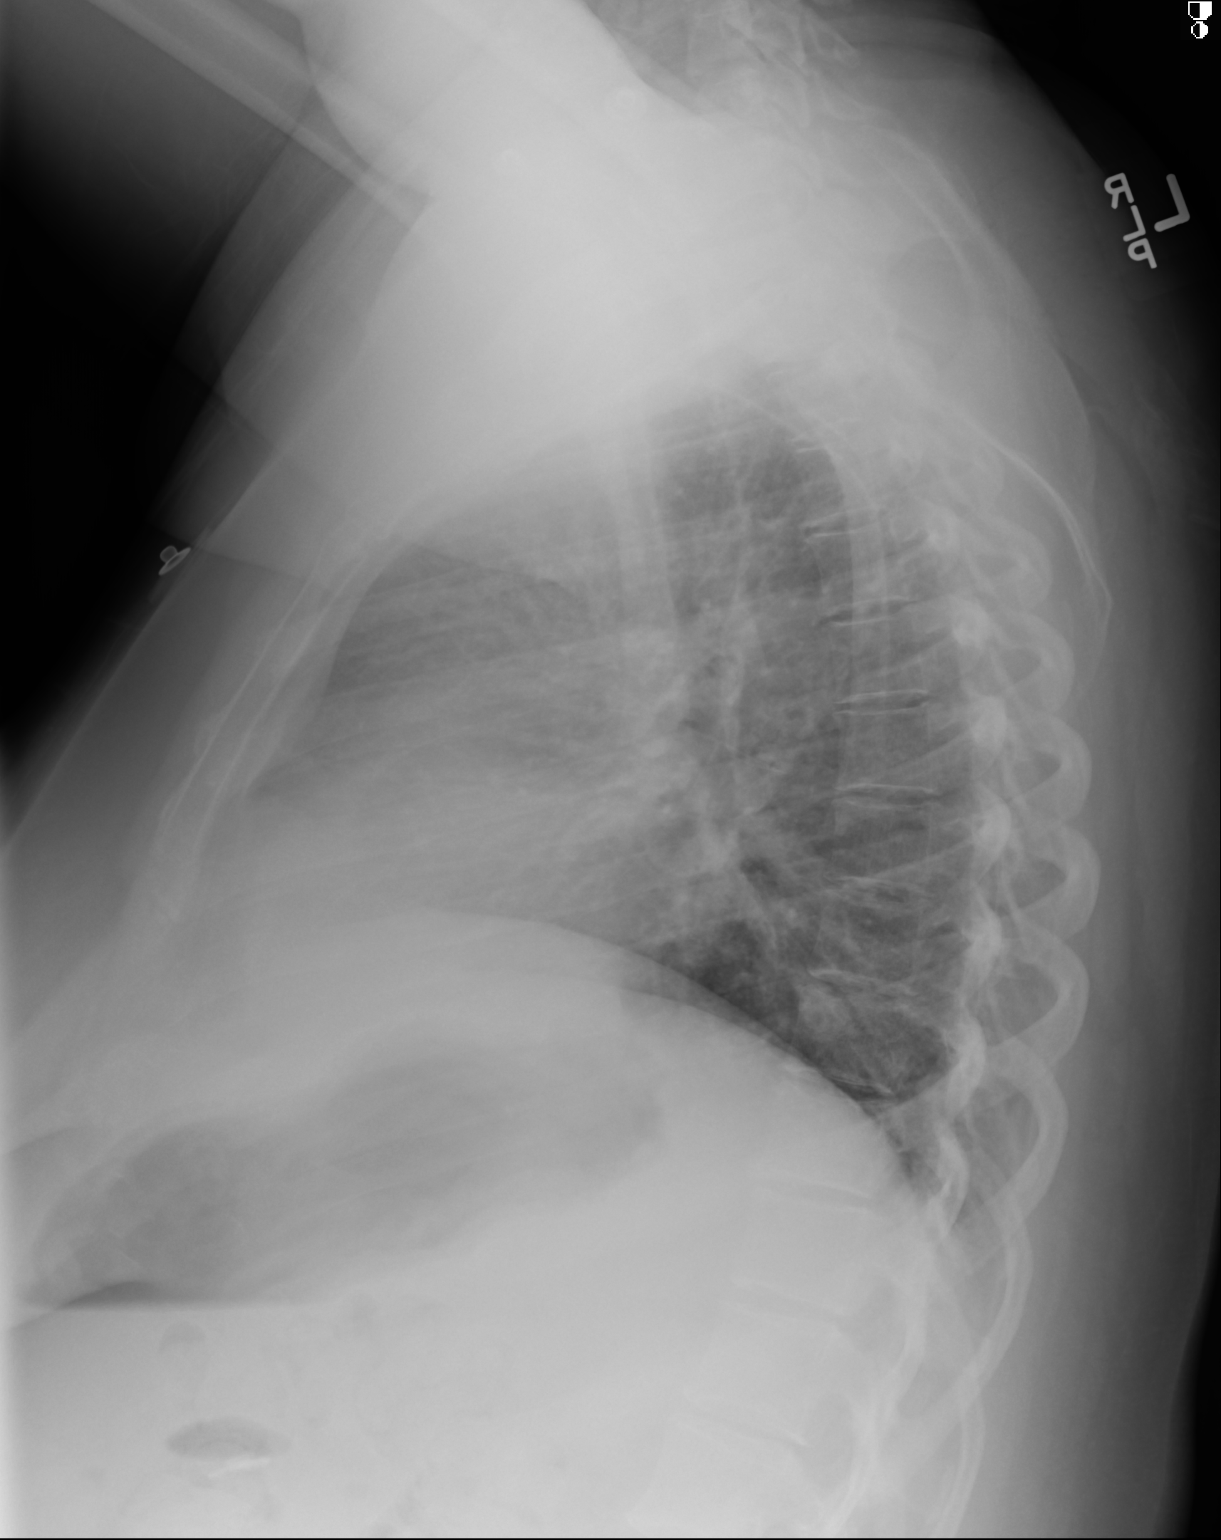

[2 of 2 positions shown; findings below may reference images not displayed]

FINDINGS: The heart is borderline enlarged but stable. Mild chronic bronchitic
changes but no infiltrates, edema or effusions. The bony thorax is
intact.
IMPRESSION: Borderline cardiac enlargement, stable.

Chronic bronchitic changes but no acute pulmonary findings.

## 2015-11-22 MED ORDER — AMLODIPINE BESYLATE 2.5 MG PO TABS: 2.5000 mg | ORAL_TABLET | Freq: Every day | ORAL | Status: DC

## 2015-11-22 NOTE — Telephone Encounter (Signed)
Patient notified

## 2015-11-22 NOTE — Telephone Encounter (Signed)
Please thank her for calling I'll start a low dose BP medicine; Rx sent to [facility] Have her start that and monitor and call us back later in the week Avoid salt as much as possible, and we hope the stress in her life starts to decrease

## 2015-11-22 NOTE — Telephone Encounter (Signed)
Her BP is still running high, around 159/90. She thinks it is still stress related but wanted to let you know.

## 2015-11-23 DIAGNOSIS — E119 Type 2 diabetes mellitus without complications: Secondary | ICD-10-CM | POA: Diagnosis not present

## 2015-12-12 ENCOUNTER — Emergency Department
Admission: EM | Admit: 2015-12-12 | Discharge: 2015-12-12 | Disposition: A | Discharge status: Home / Self Care | Payer: Medicare Other | Attending: Emergency Medicine | Admitting: Emergency Medicine

## 2015-12-12 ENCOUNTER — Encounter: Payer: Self-pay | Admitting: Emergency Medicine

## 2015-12-12 DIAGNOSIS — E1140 Type 2 diabetes mellitus with diabetic neuropathy, unspecified: Secondary | ICD-10-CM | POA: Insufficient documentation

## 2015-12-12 DIAGNOSIS — Z794 Long term (current) use of insulin: Secondary | ICD-10-CM | POA: Insufficient documentation

## 2015-12-12 DIAGNOSIS — A0811 Acute gastroenteropathy due to Norwalk agent: Secondary | ICD-10-CM

## 2015-12-12 DIAGNOSIS — Z7984 Long term (current) use of oral hypoglycemic drugs: Secondary | ICD-10-CM | POA: Insufficient documentation

## 2015-12-12 DIAGNOSIS — E785 Hyperlipidemia, unspecified: Secondary | ICD-10-CM | POA: Insufficient documentation

## 2015-12-12 DIAGNOSIS — J449 Chronic obstructive pulmonary disease, unspecified: Secondary | ICD-10-CM | POA: Insufficient documentation

## 2015-12-12 DIAGNOSIS — I110 Hypertensive heart disease with heart failure: Secondary | ICD-10-CM | POA: Insufficient documentation

## 2015-12-12 DIAGNOSIS — R1084 Generalized abdominal pain: Secondary | ICD-10-CM | POA: Diagnosis present

## 2015-12-12 DIAGNOSIS — R531 Weakness: Secondary | ICD-10-CM | POA: Diagnosis not present

## 2015-12-12 DIAGNOSIS — R112 Nausea with vomiting, unspecified: Secondary | ICD-10-CM | POA: Diagnosis not present

## 2015-12-12 DIAGNOSIS — J45909 Unspecified asthma, uncomplicated: Secondary | ICD-10-CM | POA: Insufficient documentation

## 2015-12-12 DIAGNOSIS — I509 Heart failure, unspecified: Secondary | ICD-10-CM | POA: Insufficient documentation

## 2015-12-12 DIAGNOSIS — E860 Dehydration: Secondary | ICD-10-CM | POA: Diagnosis not present

## 2015-12-12 DIAGNOSIS — E039 Hypothyroidism, unspecified: Secondary | ICD-10-CM | POA: Diagnosis not present

## 2015-12-12 DIAGNOSIS — Z79899 Other long term (current) drug therapy: Secondary | ICD-10-CM | POA: Diagnosis not present

## 2015-12-12 LAB — CBC
HEMATOCRIT: 45.1 % (ref 35.0–47.0)
Hemoglobin: 15.1 g/dL (ref 12.0–16.0)
MCH: 28.8 pg (ref 26.0–34.0)
MCHC: 33.5 g/dL (ref 32.0–36.0)
MCV: 86 fL (ref 80.0–100.0)
PLATELETS: 264 10*3/uL (ref 150–440)
RBC: 5.25 MIL/uL — ABNORMAL HIGH (ref 3.80–5.20)
RDW: 14.2 % (ref 11.5–14.5)
WBC: 6.4 10*3/uL (ref 3.6–11.0)

## 2015-12-12 LAB — COMPREHENSIVE METABOLIC PANEL
ALBUMIN: 4.6 g/dL (ref 3.5–5.0)
ALT: 47 U/L (ref 14–54)
AST: 50 U/L — AB (ref 15–41)
Alkaline Phosphatase: 136 U/L — ABNORMAL HIGH (ref 38–126)
Anion gap: 13 (ref 5–15)
BILIRUBIN TOTAL: 0.3 mg/dL (ref 0.3–1.2)
BUN: 40 mg/dL — AB (ref 6–20)
CHLORIDE: 102 mmol/L (ref 101–111)
CO2: 19 mmol/L — ABNORMAL LOW (ref 22–32)
CREATININE: 2.22 mg/dL — AB (ref 0.44–1.00)
Calcium: 9.9 mg/dL (ref 8.9–10.3)
GFR calc Af Amer: 28 mL/min — ABNORMAL LOW (ref >60)
GFR, EST NON AFRICAN AMERICAN: 24 mL/min — AB (ref >60)
GLUCOSE: 129 mg/dL — AB (ref 65–99)
Potassium: 3 mmol/L — ABNORMAL LOW (ref 3.5–5.1)
Sodium: 134 mmol/L — ABNORMAL LOW (ref 135–145)
TOTAL PROTEIN: 8.2 g/dL — AB (ref 6.5–8.1)

## 2015-12-12 LAB — BASIC METABOLIC PANEL
ANION GAP: 9 (ref 5–15)
BUN: 36 mg/dL — ABNORMAL HIGH (ref 6–20)
CALCIUM: 8.4 mg/dL — AB (ref 8.9–10.3)
CO2: 17 mmol/L — AB (ref 22–32)
CREATININE: 1.78 mg/dL — AB (ref 0.44–1.00)
Chloride: 110 mmol/L (ref 101–111)
GFR calc Af Amer: 37 mL/min — ABNORMAL LOW (ref >60)
GFR, EST NON AFRICAN AMERICAN: 32 mL/min — AB (ref >60)
GLUCOSE: 129 mg/dL — AB (ref 65–99)
Potassium: 2.8 mmol/L — CL (ref 3.5–5.1)
Sodium: 136 mmol/L (ref 135–145)

## 2015-12-12 LAB — LIPASE, BLOOD: LIPASE: 16 U/L (ref 11–51)

## 2015-12-12 MED ORDER — DIPHENOXYLATE-ATROPINE 2.5-0.025 MG PO TABS
2.0000 | ORAL_TABLET | Freq: Once | ORAL | Status: AC
  Administered 2015-12-12: 2 via ORAL
  Filled 2015-12-12: qty 2

## 2015-12-12 MED ORDER — ONDANSETRON HCL 4 MG/2ML IJ SOLN
4.0000 mg | Freq: Once | INTRAMUSCULAR | Status: AC
  Administered 2015-12-12: 4 mg via INTRAVENOUS
  Filled 2015-12-12: qty 2

## 2015-12-12 MED ORDER — DIPHENOXYLATE-ATROPINE 2.5-0.025 MG PO TABS: 1.0000 | ORAL_TABLET | Freq: Four times a day (QID) | ORAL | Status: DC | PRN

## 2015-12-12 MED ORDER — POTASSIUM CHLORIDE CRYS ER 20 MEQ PO TBCR
40.0000 meq | EXTENDED_RELEASE_TABLET | Freq: Once | ORAL | Status: AC
  Administered 2015-12-12: 40 meq via ORAL
  Filled 2015-12-12: qty 2

## 2015-12-12 MED ORDER — SODIUM CHLORIDE 0.9 % IV SOLN
Freq: Once | INTRAVENOUS | Status: AC
  Administered 2015-12-12: 1000 mL via INTRAVENOUS

## 2015-12-12 MED ORDER — ONDANSETRON HCL 4 MG PO TABS: 4.0000 mg | ORAL_TABLET | Freq: Every day | ORAL | Status: DC | PRN

## 2015-12-12 NOTE — ED Notes (Signed)
c/o of abd pain, diarrhea, feeling dehydrated

## 2015-12-12 NOTE — ED Notes (Signed)
Pt able to drink PO fluids without emesis. Pt ambulatory

## 2015-12-12 NOTE — ED Provider Notes (Signed)
[facility] Emergency Department Provider Note     Time seen: ----------------------------------------- 4:00 PM on 12/12/2015 -----------------------------------------    I have reviewed the triage vital signs and the nursing notes.   HISTORY  Chief Complaint Abdominal Pain    HPI [name] is a 50 y.o. female who presents ER for abdominal pain, diarrhea and feeling dehydrated. Patient states she's had nausea vomiting diarrhea for the last 48 hours. She's had multiple sick family members with the same thing, she complains of generalized abdominal pain. Nothing makes her symptoms better. She is concerned she may be dehydrated.   Past Medical History  Diagnosis Date  . High blood pressure   . Heart attack ([place])   . Irregular heart rhythm   . Seasonal allergies   . Chronic headaches   . Sleep apnea   . Hyperlipidemia   . COPD (chronic obstructive pulmonary disease) ([place])   . Asthma   . Hypothyroid   . Morbid obesity ([place])   . OSA (obstructive sleep apnea)   . RLS (restless legs syndrome)   . GERD (gastroesophageal reflux disease)   . Bipolar depression ([place])   . Vitamin D deficiency   . Urinary incontinence   . Hypoxemia   . Liver lesion     Favored to be benign on CT; MRI of abd w/contract in Aug 2016. Liver Biopsy-Negative, March 2016  . Acute renal failure ([place])   . Hypomagnesemia   . Hypertension   . Headache   . Diabetes mellitus without complication (HCC)     Type 2, insulin dependent. Patient also takes Metformin.  . CHF (congestive heart failure) ([place])   . Diabetic neuropathy ([place])   . Pinched nerve     Back    Patient Active Problem List   Diagnosis Date Noted  . Adenomatous colon polyp 10/15/2015  . Airway hyperreactivity 10/15/2015  . Bipolar affective disorder ([place]) 10/15/2015  . CN (constipation) 10/15/2015  . CAFL (chronic airflow limitation) ([place]) 10/15/2015  . Drug-induced hepatic toxicity 10/15/2015  . Blood  in feces 10/15/2015  . HLD (hyperlipidemia) 10/15/2015  . Hyperthyroidism 10/15/2015  . Hypoxemia 10/15/2015  . Lesion of liver 10/15/2015  . NASH (nonalcoholic steatohepatitis) 10/15/2015  . Adiposity 10/15/2015  . Restless leg syndrome 10/15/2015  . Type 2 diabetes mellitus ([place]) 10/15/2015  . Severe obstructive sleep apnea 10/15/2015  . Elevated alkaline phosphatase level 09/22/2015  . Urinary bladder incontinence 09/22/2015  . Diarrhea 08/18/2015  . Low back pain with left-sided sciatica 08/18/2015  . Encounter for monitoring tricyclic antidepressant therapy 05/13/2015  . Bipolar disorder ([place]) 05/11/2015  . Liver disease 05/11/2015  . Diabetes ([place]) 05/11/2015  . Apnea, sleep 05/05/2015  . Heart valve disease 05/05/2015  . Chronic kidney disease, stage III (moderate) 03/15/2015  . Anemia 03/15/2015  . Migraine 03/12/2015  . COPD (chronic obstructive pulmonary disease) ([place]) 01/20/2015  . 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 GERD (gastroesophageal reflux disease) 01/20/2015  . Disorder of smooth muscle 01/20/2015  . Hepatic encephalopathy ([place]) 01/20/2015  . Hypothyroid   . RLS (restless legs syndrome)   . Vitamin D deficiency   . Urinary incontinence   . Liver lesion   . Hypomagnesemia   . Obstructive sleep apnea 10/20/2013  . PERIPHERAL NEUROPATHY 03/04/2009  . HEMORRHOIDS, EXTERNAL 09/03/2008  . ALLERGIC RHINITIS 07/04/2007  . ASTHMA, PERSISTENT, MODERATE 07/04/2007  . INTERSTITIAL CYSTITIS 07/04/2007  . HYPERTRIGLYCERIDEMIA 07/03/2007  . FATIGUE, CHRONIC 07/03/2007  . HYPERTENSION, BENIGN ESSENTIAL 06/19/2007  Past Surgical History  Procedure Laterality Date  . Abdominal hysterectomy    . Tubal ligation    . Liver biopsy  March 2016    Negative  . Abdominal hysterectomy      Partial  . Cholecystectomy    . Bladder surgery      x 2  . Lung biopsy      Allergies Morphine and related; Codeine; Codeine; Hydrocodone-acetaminophen;  Mirtazapine; Morphine and related; Sulfamethoxazole-trimethoprim; and Vicodin  Social History Social History  Substance Use Topics  . Smoking status: Never Smoker   . Smokeless tobacco: Never Used  . Alcohol Use: No    Review of Systems Constitutional: Negative for fever. Eyes: Negative for visual changes. ENT: Negative for sore throat. Cardiovascular: Negative for chest pain. Respiratory: Negative for shortness of breath. Gastrointestinal: Positive for abdominal pain, vomiting and diarrhea Genitourinary: Negative for dysuria. Musculoskeletal: Negative for back pain. Skin: Negative for rash. Neurological: Negative for headaches, positive for weakness  10-point ROS otherwise negative.  ____________________________________________   PHYSICAL EXAM:  VITAL SIGNS: ED Triage Vitals  Enc Vitals Group     BP 12/12/15 1532 102/56 mmHg     Pulse Rate 12/12/15 1532 117     Resp 12/12/15 1532 18     Temp 12/12/15 1532 98.5 F (36.9 C)     Temp Source 12/12/15 1532 Oral     SpO2 12/12/15 1532 96 %     Weight 12/12/15 1532 182 lb (82.555 kg)     Height 12/12/15 1532 4' 11"  (1.499 m)     Head Cir --      Peak Flow --      Pain Score 12/12/15 1534 7     Pain Loc --      Pain Edu? --      Excl. in [place]? --    Constitutional: Alert and oriented. No acute distress Eyes: Conjunctivae are normal. PERRL. Normal extraocular movements. ENT   Head: Normocephalic and atraumatic.   Nose: No congestion/rhinnorhea.   Mouth/Throat: Mucous membranes are dry   Neck: No stridor. Cardiovascular: Normal rate, regular rhythm. Normal and symmetric distal pulses are present in all extremities. No murmurs, rubs, or gallops. Respiratory: Normal respiratory effort without tachypnea nor retractions. Breath sounds are clear and equal bilaterally. No wheezes/rales/rhonchi. Gastrointestinal: Soft and nontender. Normal bowel sounds Musculoskeletal: Nontender with normal range of motion in all  extremities. No joint effusions.  No lower extremity tenderness nor edema. Neurologic:  Normal speech and language. No gross focal neurologic deficits are appreciated.  Skin:  Skin is warm, dry and intact. No rash noted. Psychiatric: Mood and affect are normal. Speech and behavior are normal. Patient exhibits appropriate insight and judgment. ____________________________________________  ED COURSE:  Pertinent labs & imaging results that were available during my care of the patient were reviewed by me and considered in my medical decision making (see chart for details). Patient is in no acute distress, will give IV fluids and antiemetics and reevaluate. ____________________________________________    LABS (pertinent positives/negatives)  Labs Reviewed  COMPREHENSIVE METABOLIC PANEL - Abnormal; Notable for the following:    Sodium 134 (*)    Potassium 3.0 (*)    CO2 19 (*)    Glucose, Bld 129 (*)    BUN 40 (*)    Creatinine, Ser 2.22 (*)    Total Protein 8.2 (*)    AST 50 (*)    Alkaline Phosphatase 136 (*)    GFR calc non Af Amer 24 (*)  GFR calc Af Amer 28 (*)    All other components within normal limits  CBC - Abnormal; Notable for the following:    RBC 5.25 (*)    All other components within normal limits  BASIC METABOLIC PANEL - Abnormal; Notable for the following:    Potassium 2.8 (*)    CO2 17 (*)    Glucose, Bld 129 (*)    BUN 36 (*)    Creatinine, Ser 1.78 (*)    Calcium 8.4 (*)    GFR calc non Af Amer 32 (*)    GFR calc Af Amer 37 (*)    All other components within normal limits  LIPASE, BLOOD  URINALYSIS COMPLETEWITH MICROSCOPIC ([facility] ONLY)   ___________________________________________  FINAL ASSESSMENT AND PLAN  Norovirus, dehydration  Plan: Patient with labs as dictated above. Patient appeared dehydrated on arrival, was given 2 L of saline and we repeated basic metabolic panel. Patient states she is feeling better and is tolerating liquids and  urinating now. She is received some oral potassium for mild hypokalemia. She is stable for outpatient follow-up with her primary care doctor   Earleen Newport, MD   Earleen Newport, MD 12/12/15 Einar Crow

## 2015-12-12 NOTE — Discharge Instructions (Signed)
Norovirus Infection A norovirus infection is caused by exposure to a virus in a group of similar viruses (noroviruses). This type of infection causes inflammation in your stomach and intestines (gastroenteritis). Norovirus is the most common cause of gastroenteritis. It also causes food poisoning. Anyone can get a norovirus infection. It spreads very easily (contagious). You can get it from contaminated food, water, surfaces, or other people. Norovirus is found in the stool or vomit of infected people. You can spread the infection as soon as you feel sick until 2 weeks after you recover.  Symptoms usually begin within 2 days after you become infected. Most norovirus symptoms affect the digestive system. CAUSES Norovirus infection is caused by contact with norovirus. You can catch norovirus if you:  Eat or drink something contaminated with norovirus.  Touch surfaces or objects contaminated with norovirus and then put your hand in your mouth.  Have direct contact with an infected person who has symptoms.  Share food, drink, or utensils with someone with who is sick with norovirus. SIGNS AND SYMPTOMS Symptoms of norovirus may include:  Nausea.  Vomiting.  Diarrhea.  Stomach cramps.  Fever.  Chills.  Headache.  Muscle aches.  Tiredness. DIAGNOSIS Your health care provider may suspect norovirus based on your symptoms and physical exam. Your health care provider may also test a sample of your stool or vomit for the virus.  TREATMENT There is no specific treatment for norovirus. Most people get better without treatment in about 2 days. HOME CARE INSTRUCTIONS  Replace lost fluids by drinking plenty of water or rehydration fluids containing important minerals called electrolytes. This prevents dehydration. Drink enough fluid to keep your urine clear or pale yellow.  Do not prepare food for others while you are infected. Wait at least 3 days after recovering from the illness to do  that. PREVENTION   Wash your hands often, especially after using the toilet or changing a diaper.  Wash fruits and vegetables thoroughly before preparing or serving them.  Throw out any food that a sick person may have touched.  Disinfect contaminated surfaces immediately after someone in the household has been sick. Use a bleach-based household cleaner.  Immediately remove and wash soiled clothes or sheets. SEEK MEDICAL CARE IF:  Your vomiting, diarrhea, and stomach pain is getting worse.  Your symptoms of norovirus do not go away after 2-3 days. SEEK IMMEDIATE MEDICAL CARE IF:  You develop symptoms of dehydration that do not improve with fluid replacement. This may include:  Excessive sleepiness.  Lack of tears.  Dry mouth.  Dizziness when standing.  Weak pulse.   This information is not intended to replace advice given to you by your health care provider. Make sure you discuss any questions you have with your health care provider.   Document Released: 11/11/2002 Document Revised: 09/11/2014 Document Reviewed: 01/29/2014 Elsevier Interactive Patient Education [employer].

## 2015-12-15 IMAGING — CR DG LUMBAR SPINE 2-3V
1 series · 3 of 3 positions shown · non-contrast
Comparison: CT abdomen and pelvis 04/06/2014

CLINICAL DATA: Fall.  Low back pain and confusion.

EXAM:
LUMBAR SPINE - 2-3 VIEW

[Series 1: dxr lumbar spine ap and lateral · 0.14mm/px · 3 of 3 slices shown]
[im 1/3]
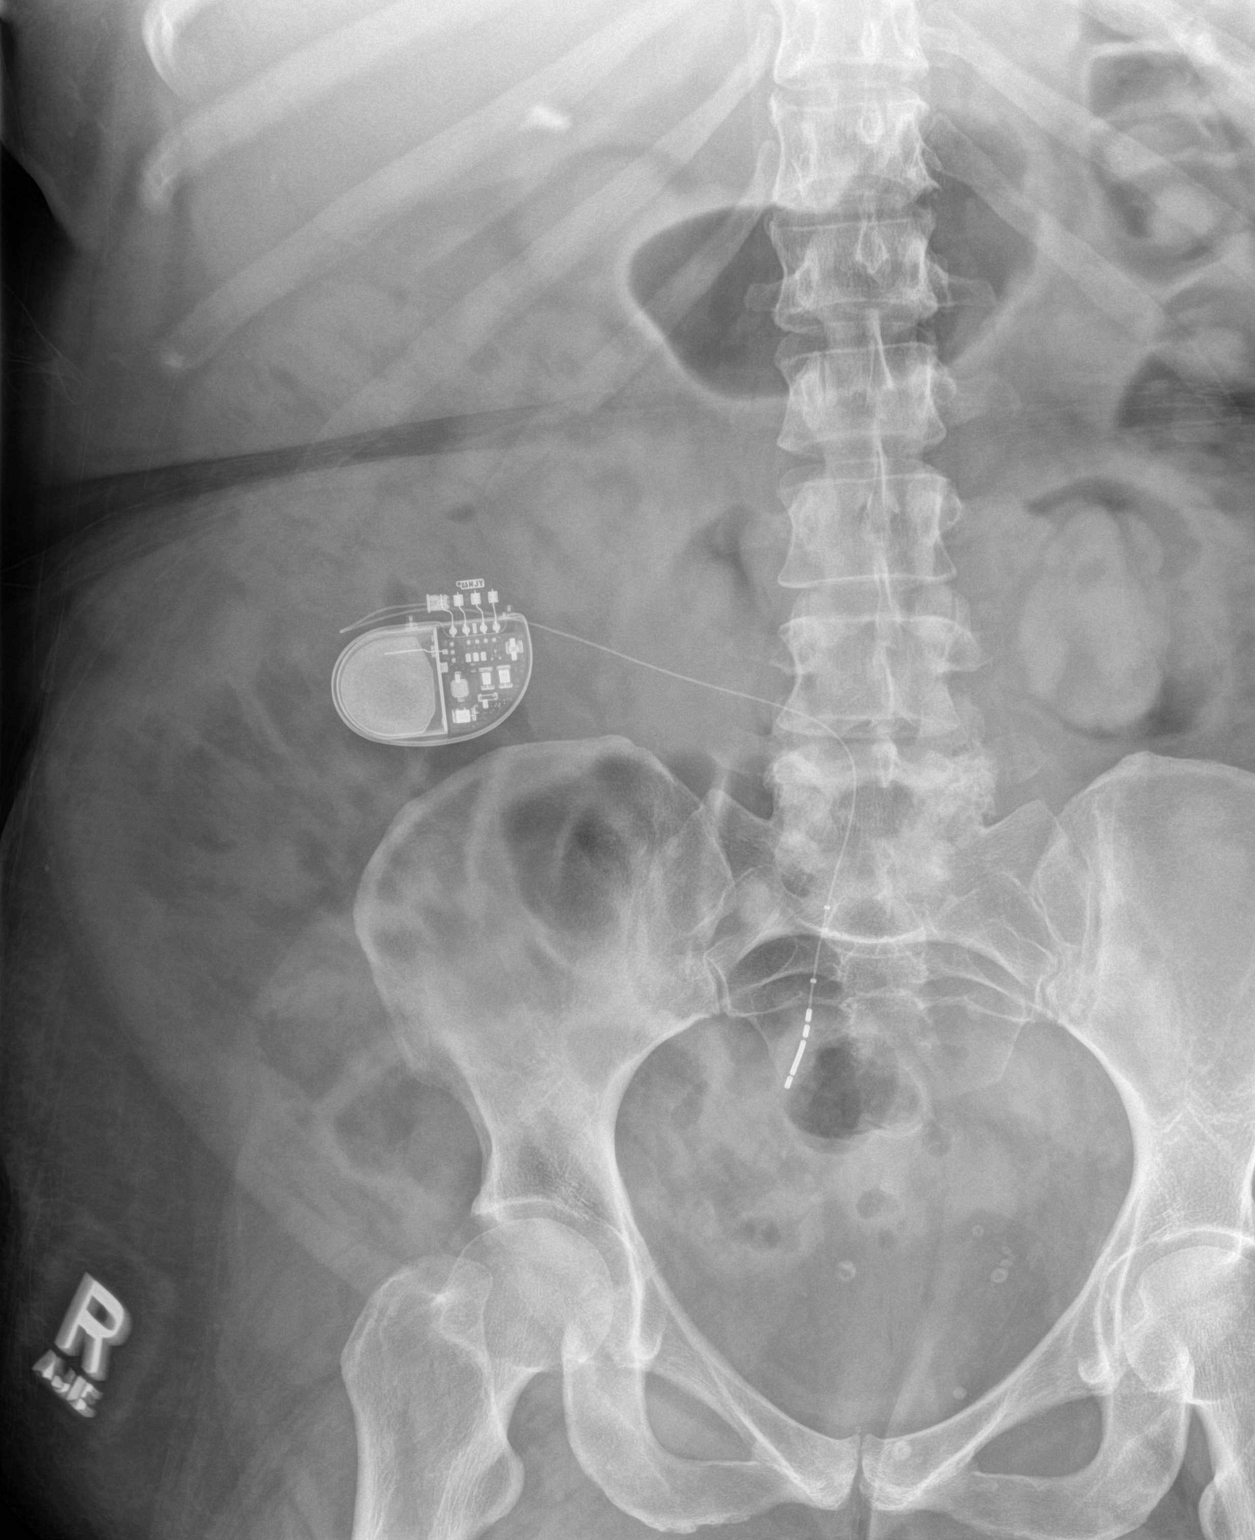
[im 2/3]
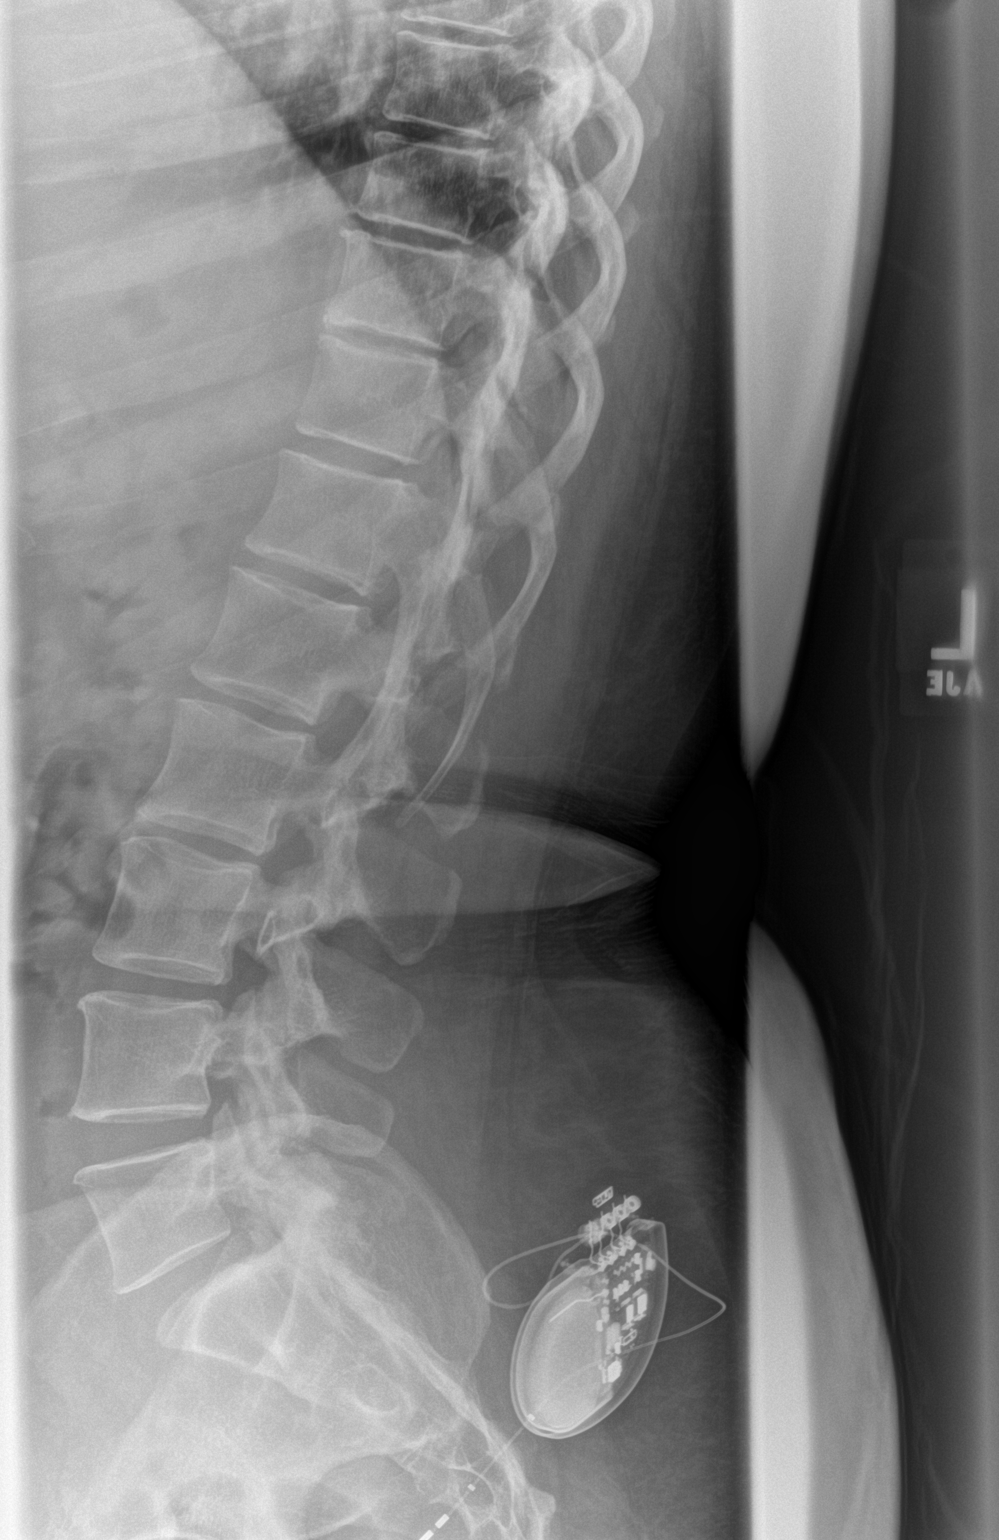
[im 3/3]
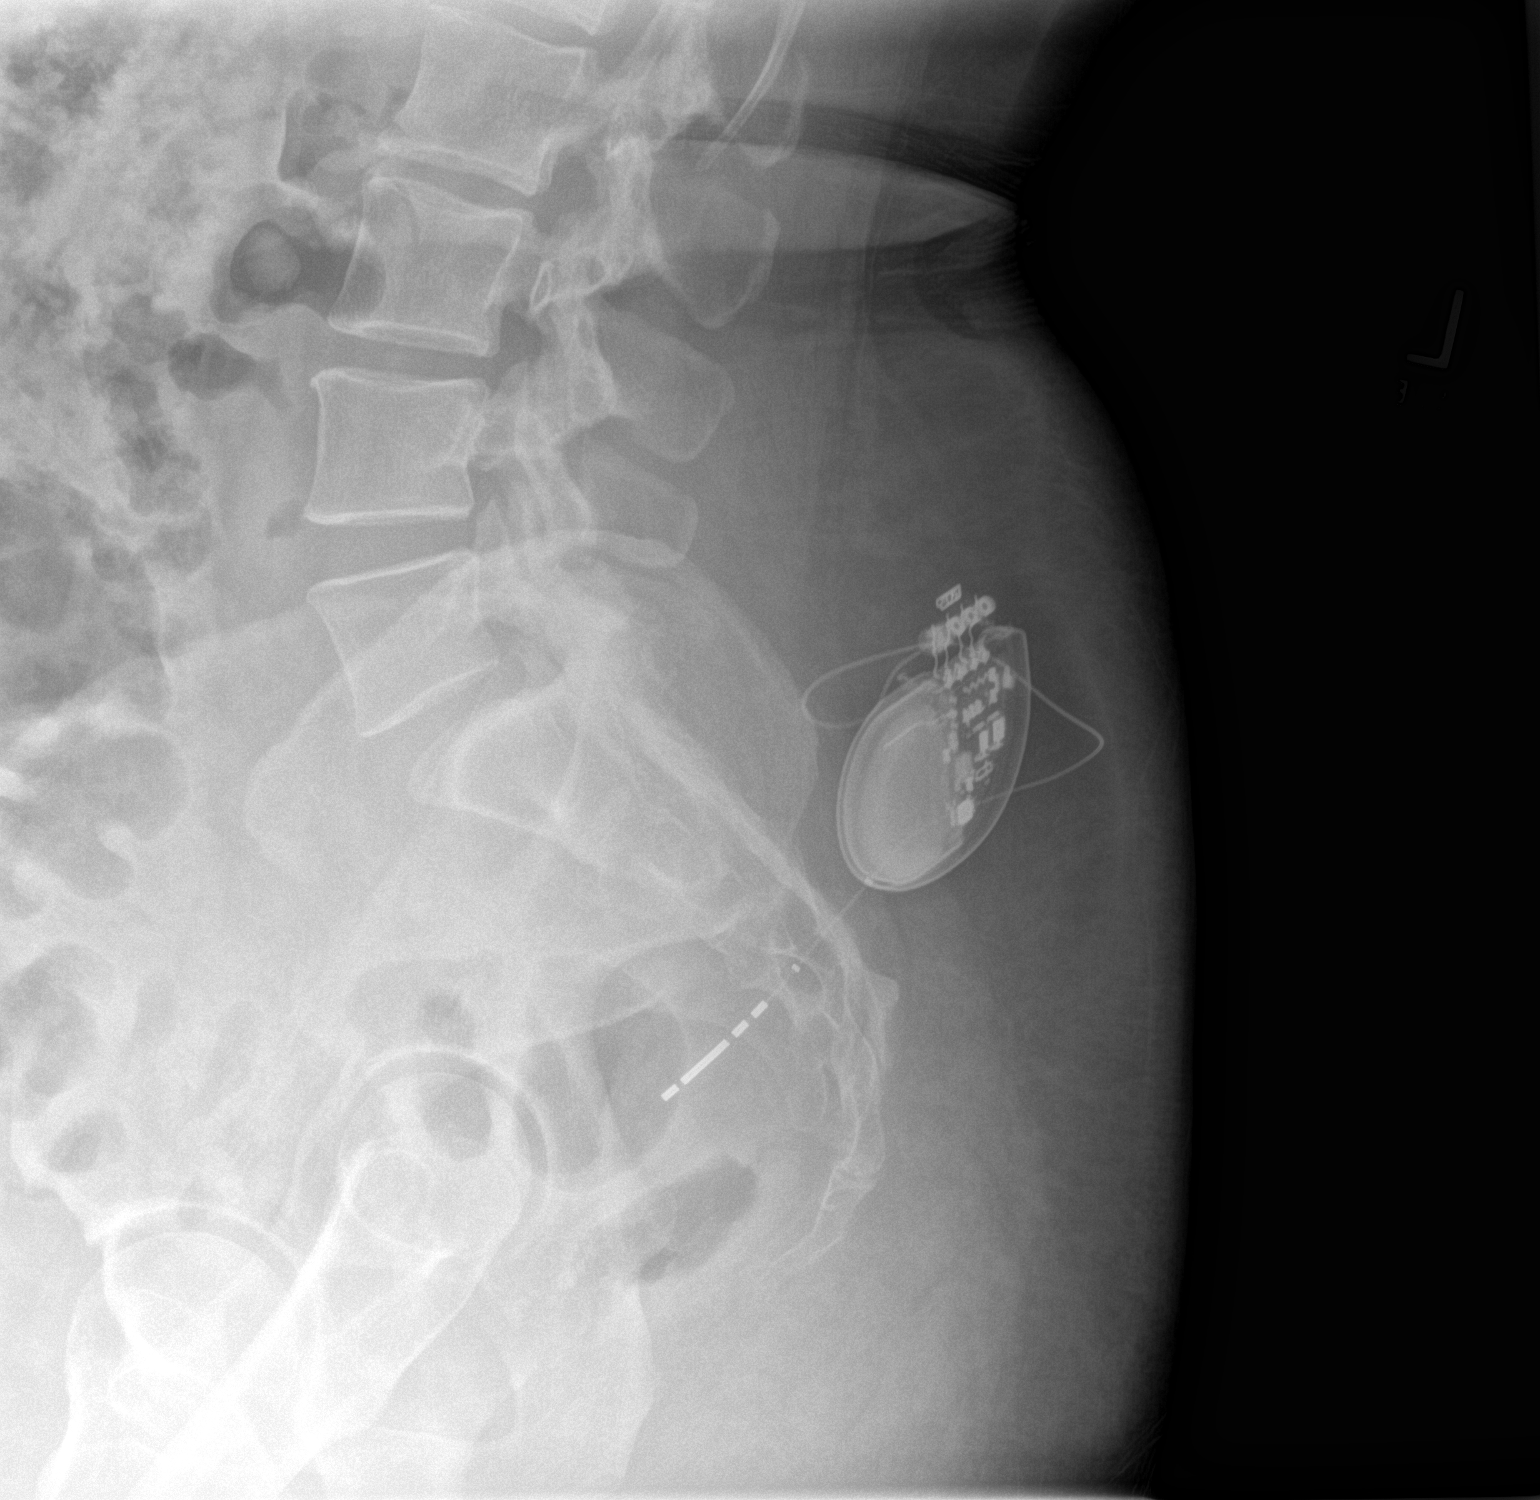

[3 of 3 positions shown; findings below may reference images not displayed]

FINDINGS: There are 5 non rib-bearing lumbar type vertebral bodies. Vertebral
alignment is normal. Vertebral body heights are preserved.
Intervertebral disc space heights are preserved. Minimal
degenerative endplate spurring is noted scattered throughout the
lumbar and lower thoracic spine. Moderate to severe bilateral L5-S1
facet arthrosis is again seen. Electronic generator and lead are
noted in the pelvis.
IMPRESSION: No acute osseous abnormality identified. Advanced L5-S1 facet
arthrosis.

## 2015-12-15 IMAGING — CT CT CERVICAL SPINE WITHOUT CONTRAST
4 of 5 series · 14 of 33 positions shown, 16 images · non-contrast
Comparison: Head CT from 04/23/2013.  C-spine CT from 01/18/2013.

CLINICAL DATA: Fell down stairs.  Weakness.  Head injury.

EXAM:
CT HEAD WITHOUT CONTRAST
CT CERVICAL SPINE WITHOUT CONTRAST
TECHNIQUE: Multidetector CT imaging of the head and cervical spine was
performed following the standard protocol without intravenous
contrast. Multiplanar CT image reconstructions of the cervical spine
were also generated.

[Series 5: c spine soft · axial · 0.41mm/px · z∈[+166,+200]mm · 2 of 86 slices shown]
[im 18/86  soft-tissue]
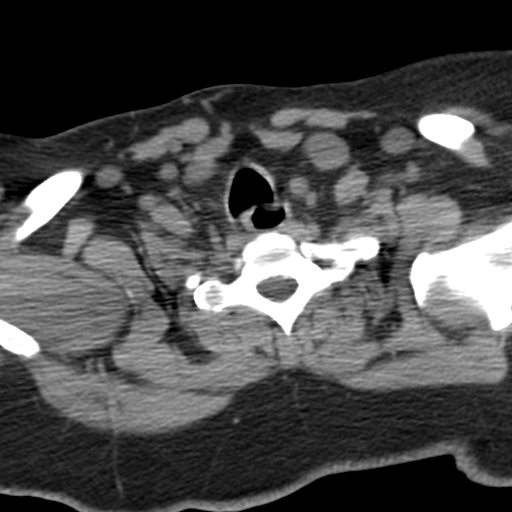
[im 35/86  soft-tissue]
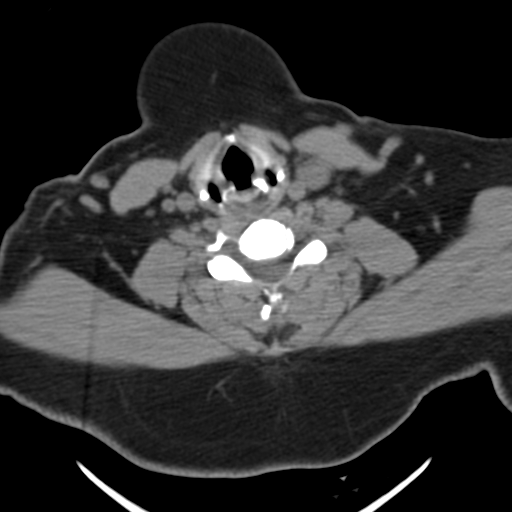

[Series 6: sag bone · sagittal · 0.26mm/px · 5 of 76 slices shown, 6 images]
[im 26/76  bone]
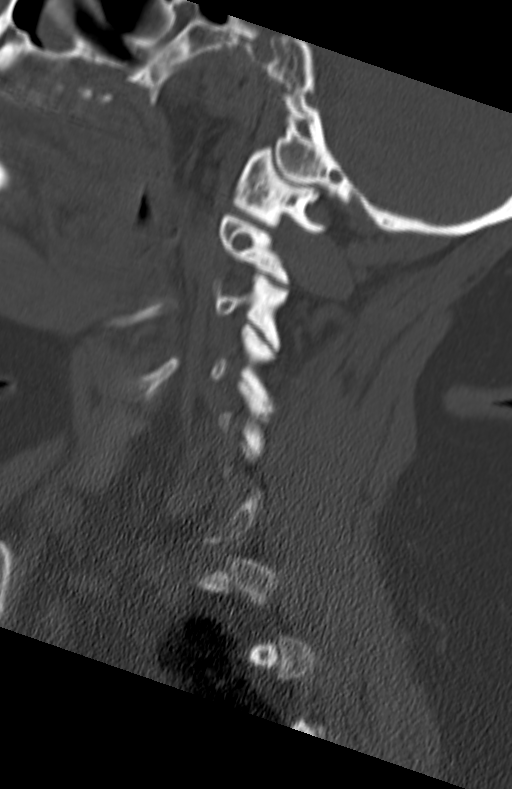
[im 32/76  bone]
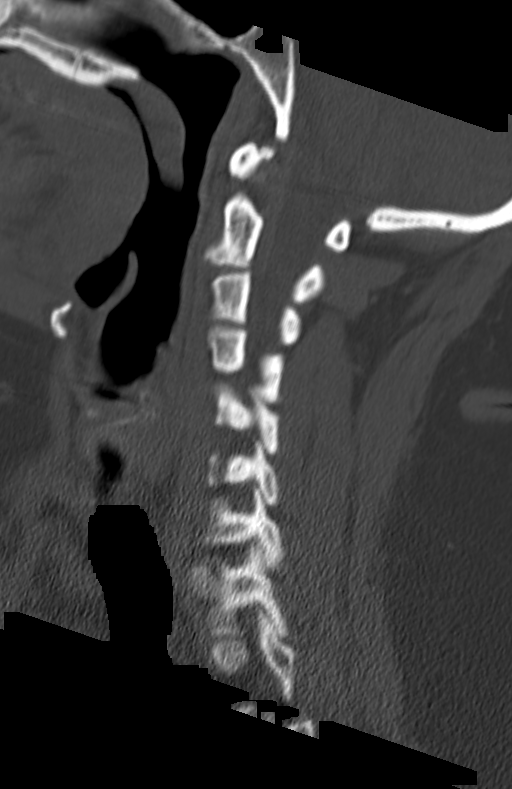
[im 38/76  soft-tissue]
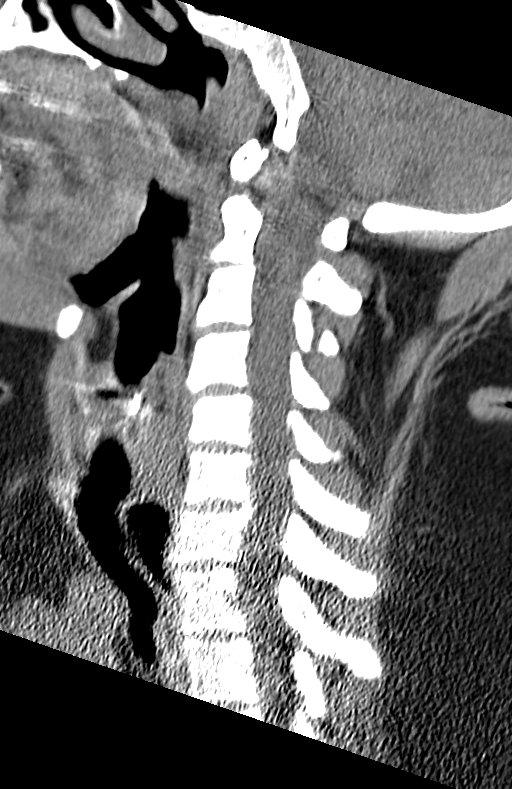
[im 38/76  bone]
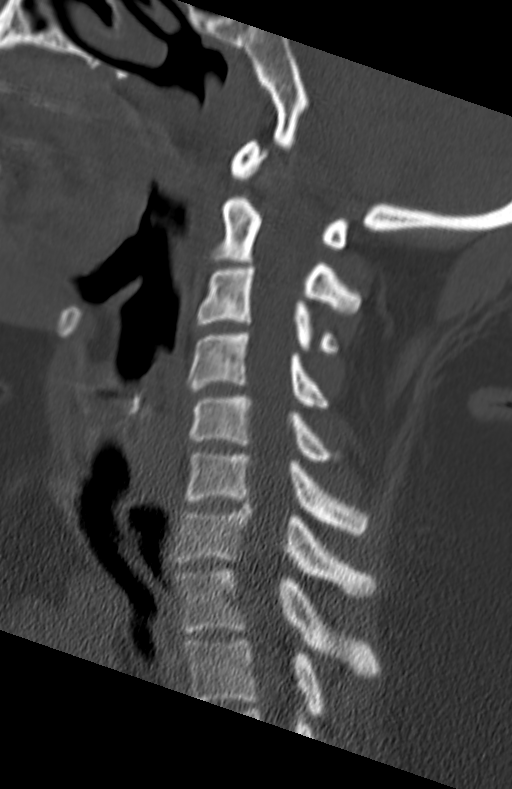
[im 44/76  bone]
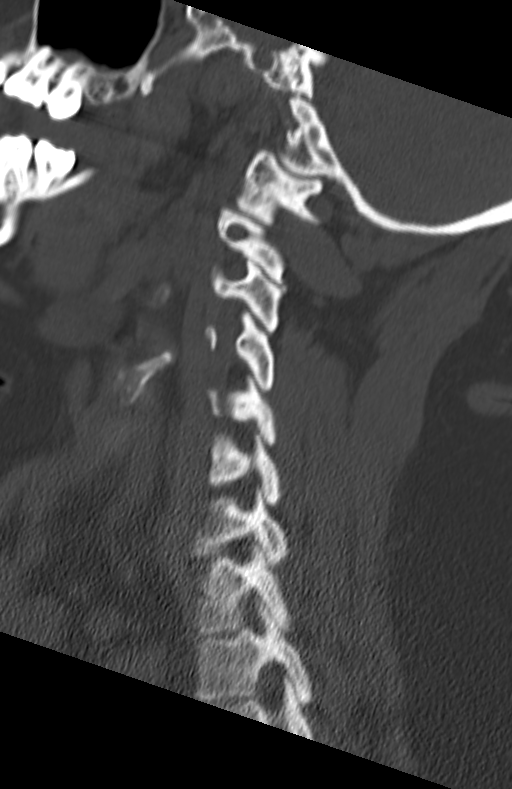
[im 51/76  bone]
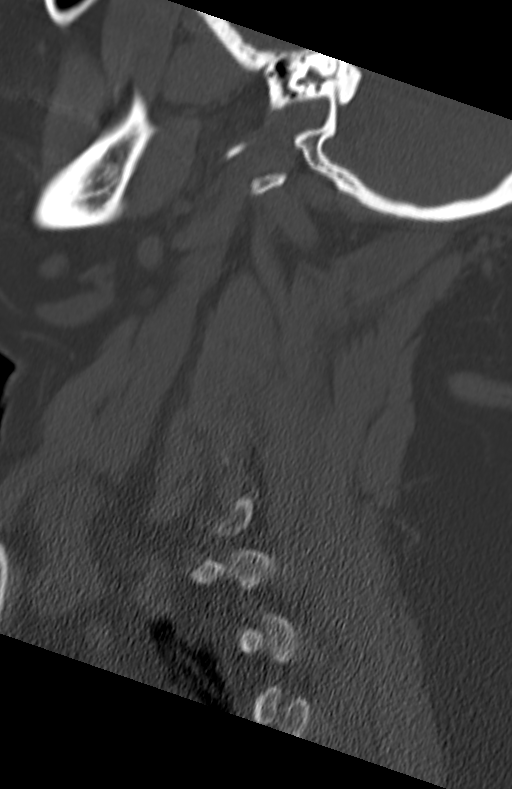

[Series 7: cor bone · coronal · 0.42mm/px · 3 of 75 slices shown]
[im 15/75  bone]
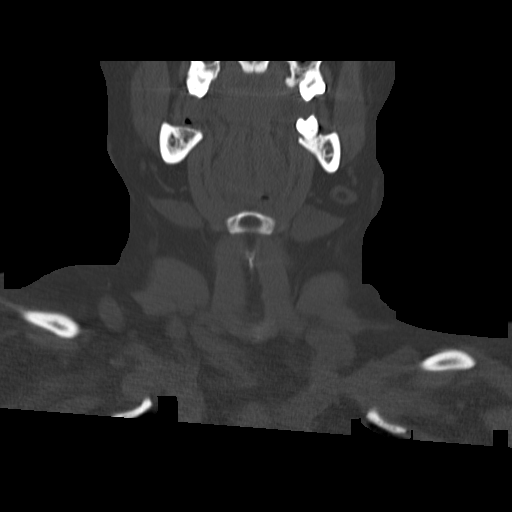
[im 30/75  bone]
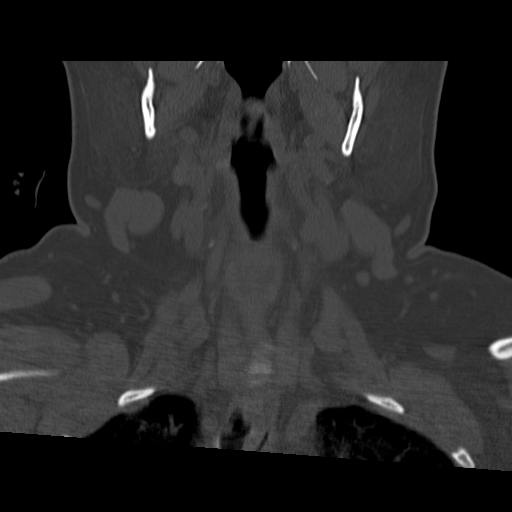
[im 45/75  bone]
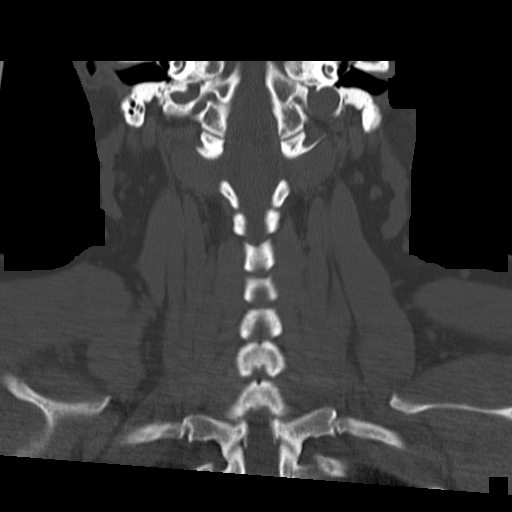

[Series 8: orthogonal axials · axial · 0.29mm/px · z∈[+135,+247]mm · 4 of 95 slices shown, 5 images]
[im 19/95  soft-tissue]
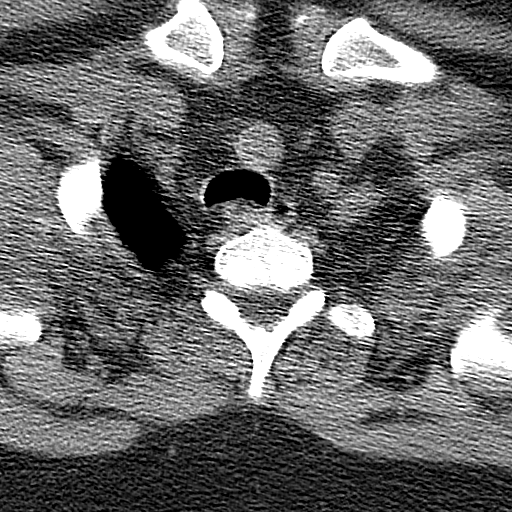
[im 19/95  bone]
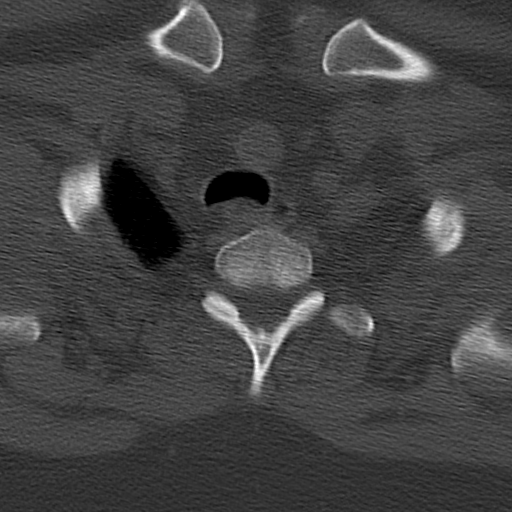
[im 38/95  bone]
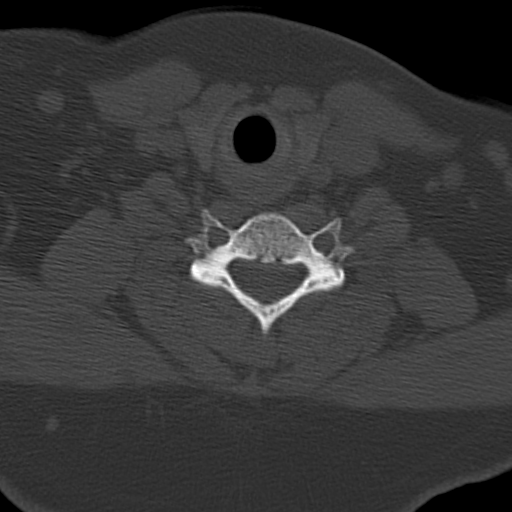
[im 57/95  bone]
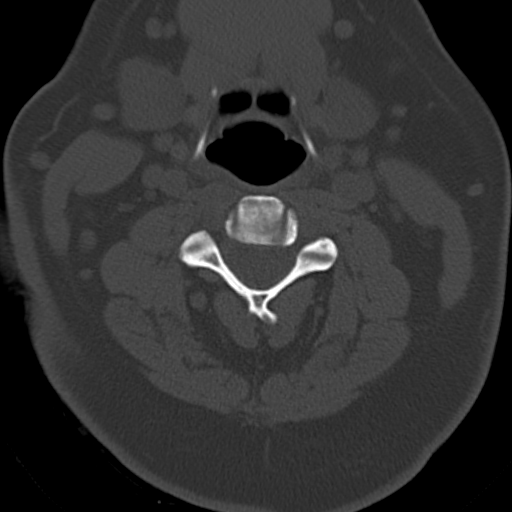
[im 76/95  bone]
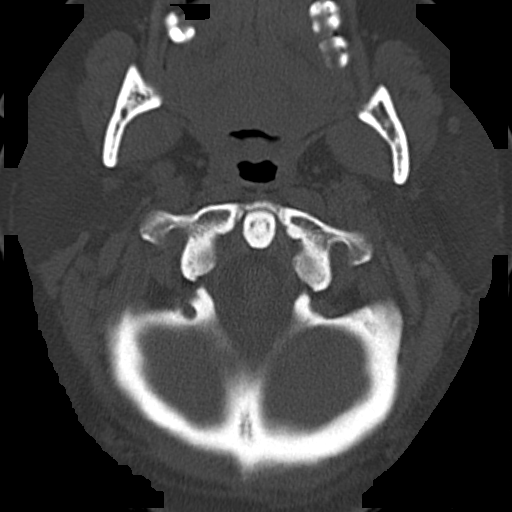

[14 of 33 positions shown; findings below may reference images not displayed]

FINDINGS: CT HEAD FINDINGS

The There is no evidence for acute hemorrhage, hydrocephalus, mass
lesion, or abnormal extra-axial fluid collection. No definite CT
evidence for acute infarction. Physiologic basal ganglia
calcification noted bilaterally. Scattered chronic mucosal disease
identified in the paranasal sinuses. No evidence for skull fracture.

CT CERVICAL SPINE FINDINGS

Imaging was obtained from the skullbase through the T2 vertebral
body. No cervical spine fracture. Straightening of the normal
cervical lordosis is evident. The facets are well aligned
bilaterally. There is no prevertebral soft tissue swelling.
Pleural-parenchymal scarring is seen in the lung apices bilaterally.
IMPRESSION: 1. No acute intracranial abnormality.
2. No cervical spine fracture.

## 2015-12-19 DIAGNOSIS — R0902 Hypoxemia: Secondary | ICD-10-CM | POA: Diagnosis not present

## 2015-12-19 DIAGNOSIS — J449 Chronic obstructive pulmonary disease, unspecified: Secondary | ICD-10-CM | POA: Diagnosis not present

## 2015-12-19 DIAGNOSIS — G4733 Obstructive sleep apnea (adult) (pediatric): Secondary | ICD-10-CM | POA: Diagnosis not present

## 2015-12-22 ENCOUNTER — Ambulatory Visit: Payer: Self-pay | Admitting: Family Medicine

## 2015-12-23 ENCOUNTER — Ambulatory Visit: Payer: Medicare Other | Admitting: Gastroenterology

## 2015-12-30 IMAGING — US US RENAL KIDNEY
1 series · 14 of 25 positions shown · non-contrast
Comparison: None.

CLINICAL DATA: Acute renal failure.

EXAM:
RENAL/URINARY TRACT ULTRASOUND COMPLETE

[Series 1: us renal kidney · 0.24mm/px · 14 of 35 slices shown]
[im 1/35]
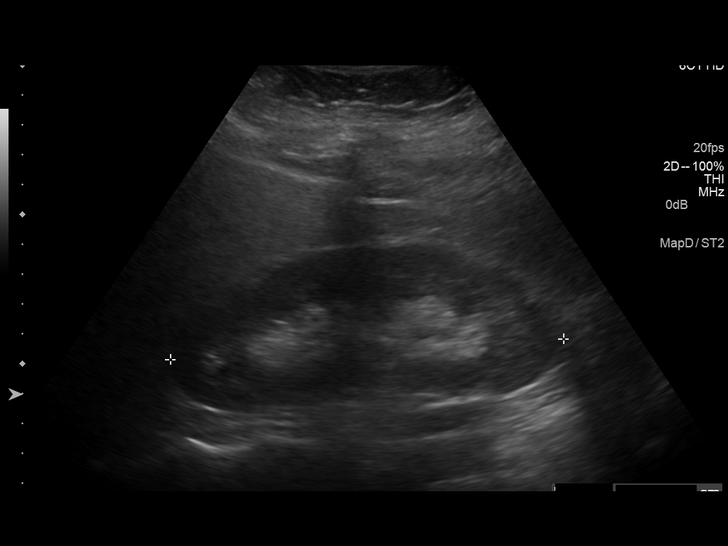
[im 3/35]
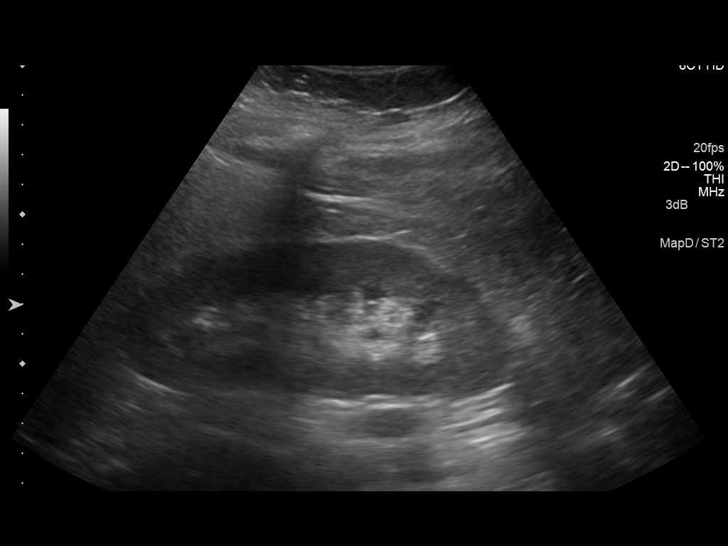
[im 6/35]
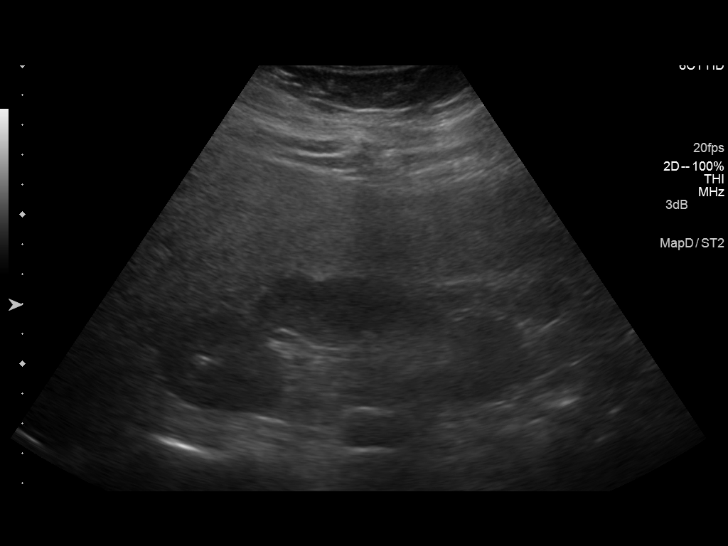
[im 9/35]
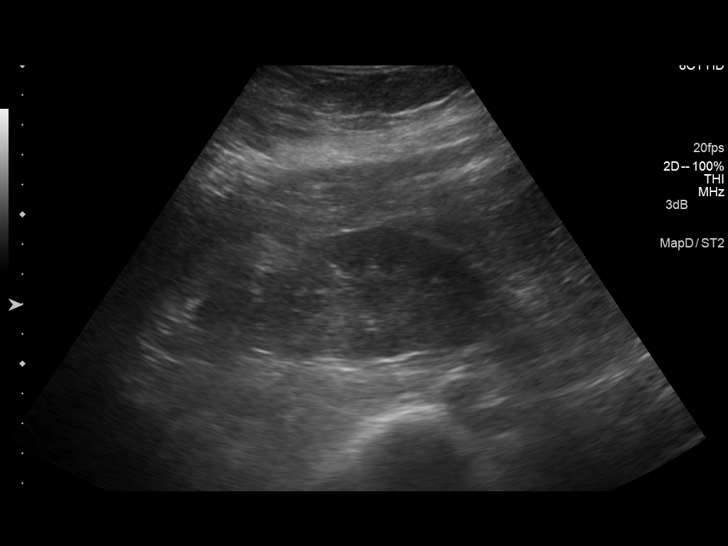
[im 12/35]
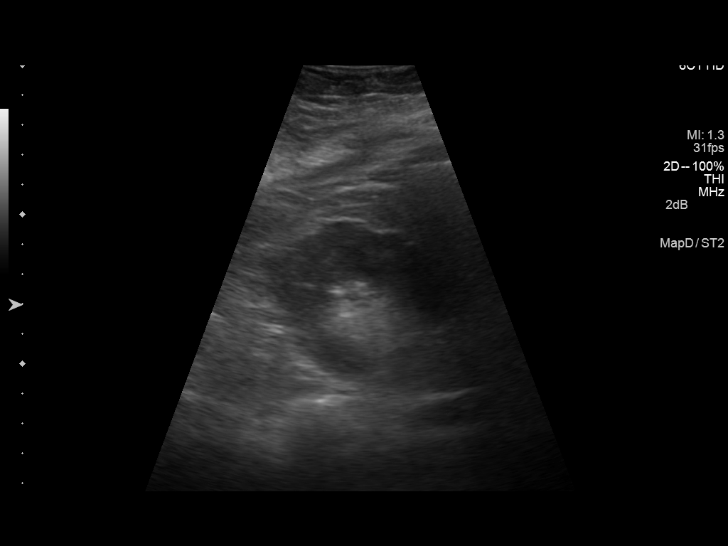
[im 13/35]
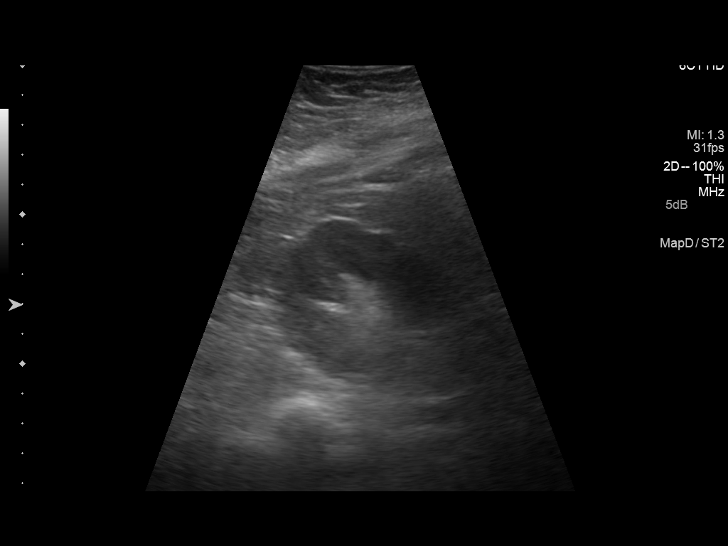
[im 16/35]
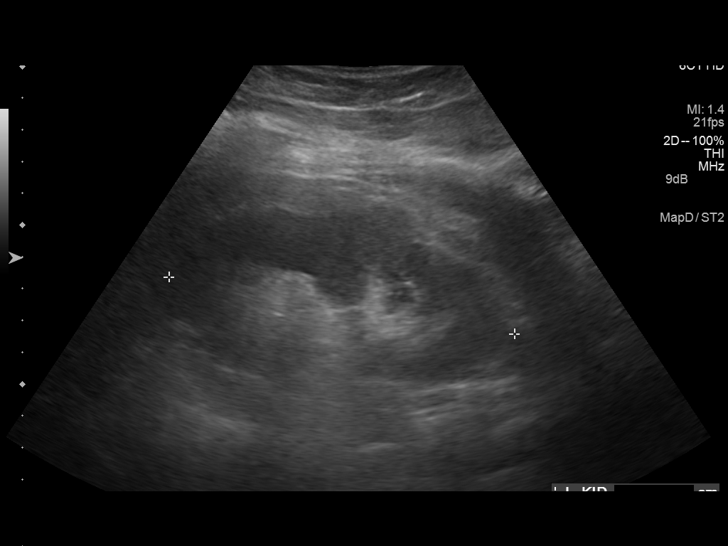
[im 19/35]
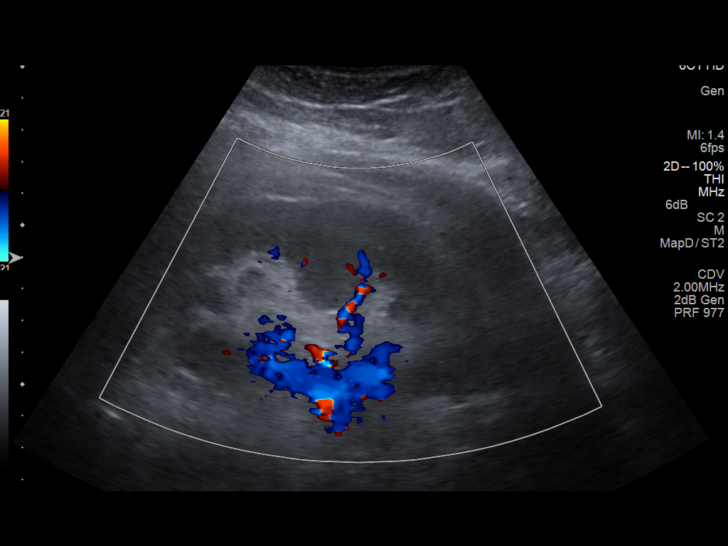
[im 22/35]
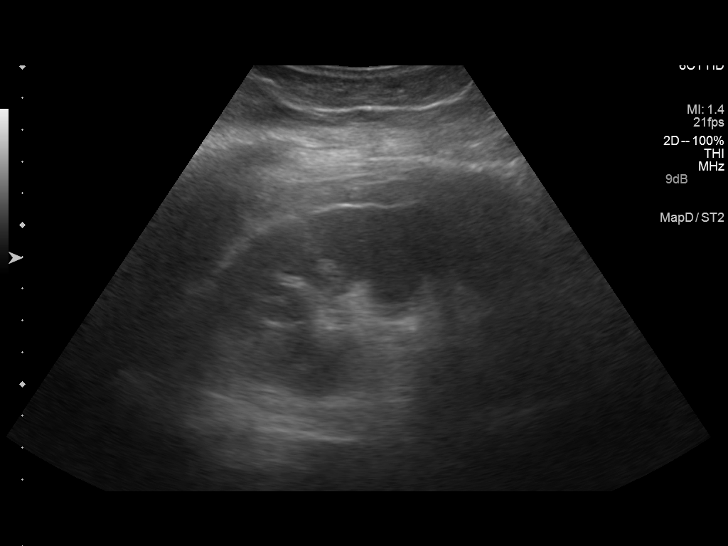
[im 23/35]
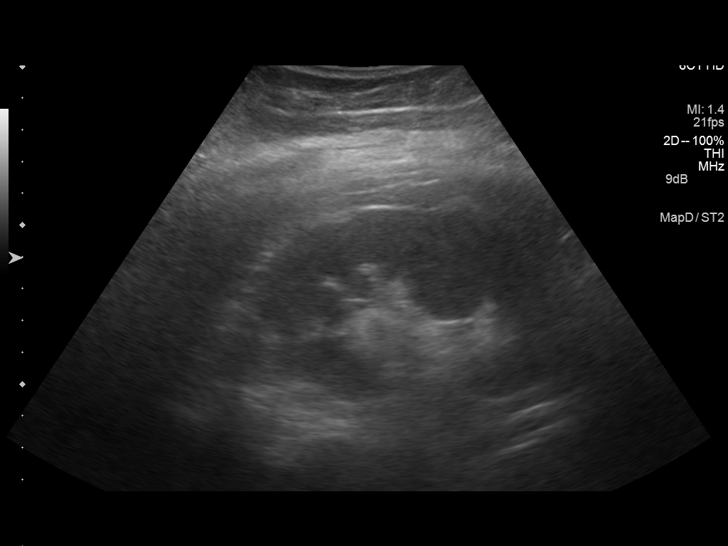
[im 26/35]
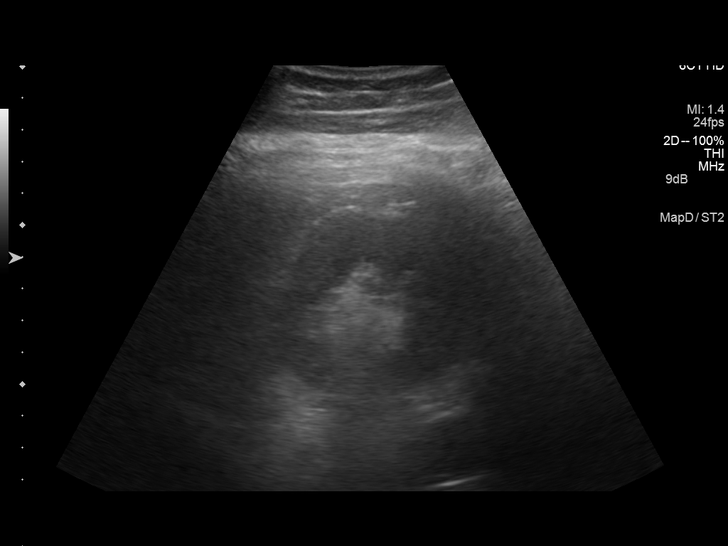
[im 29/35]
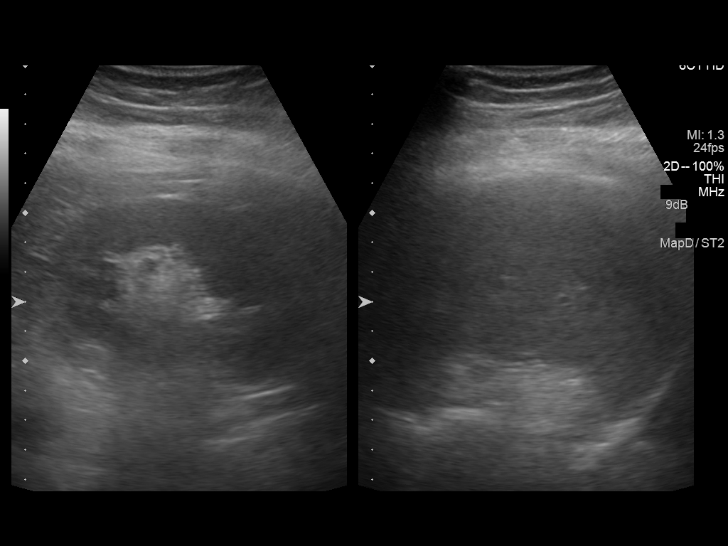
[im 32/35]
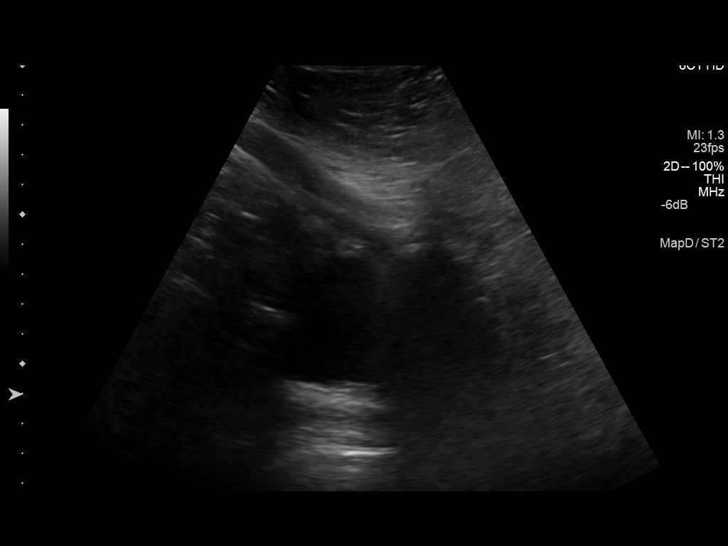
[im 35/35]
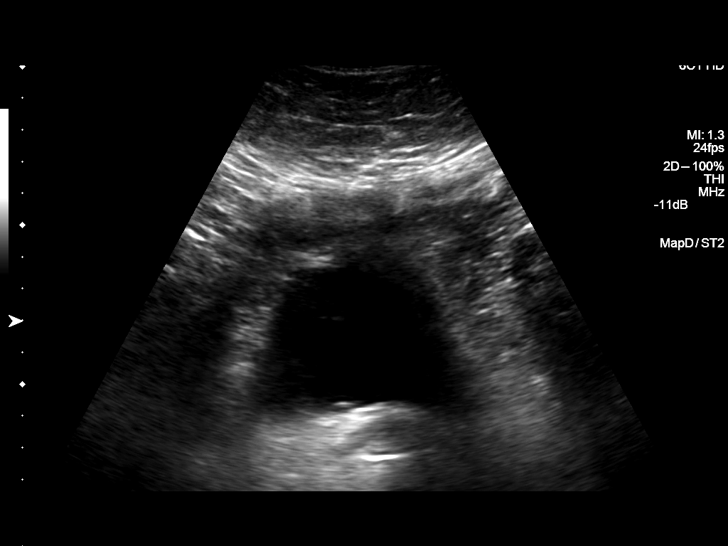

[14 of 25 positions shown; findings below may reference images not displayed]

FINDINGS: Right Kidney:

Length: 13.2 cm. Echogenicity within normal limits. No mass or
hydronephrosis visualized.

Left Kidney:

Length: 11 cm. Echogenicity within normal limits. No mass or
hydronephrosis visualized.

Bladder:

Appears normal for degree of bladder distention.
IMPRESSION: Normal renal ultrasound.

## 2016-01-06 ENCOUNTER — Encounter: Payer: Self-pay | Admitting: Emergency Medicine

## 2016-01-06 ENCOUNTER — Ambulatory Visit (INDEPENDENT_AMBULATORY_CARE_PROVIDER_SITE_OTHER): Payer: Medicare Other | Admitting: Family Medicine

## 2016-01-06 ENCOUNTER — Emergency Department: Payer: Medicare Other

## 2016-01-06 ENCOUNTER — Encounter: Payer: Self-pay | Admitting: Family Medicine

## 2016-01-06 ENCOUNTER — Emergency Department
Admission: EM | Admit: 2016-01-06 | Discharge: 2016-01-06 | Disposition: A | Discharge status: Home / Self Care | Payer: Medicare Other | Attending: Emergency Medicine | Admitting: Emergency Medicine

## 2016-01-06 VITALS — BP 132/86 | HR 97 | Temp 98.3°F | Resp 16 | Wt 186.0 lb

## 2016-01-06 DIAGNOSIS — N3001 Acute cystitis with hematuria: Secondary | ICD-10-CM | POA: Diagnosis not present

## 2016-01-06 DIAGNOSIS — R319 Hematuria, unspecified: Secondary | ICD-10-CM

## 2016-01-06 DIAGNOSIS — I509 Heart failure, unspecified: Secondary | ICD-10-CM | POA: Diagnosis not present

## 2016-01-06 DIAGNOSIS — F4321 Adjustment disorder with depressed mood: Secondary | ICD-10-CM | POA: Diagnosis not present

## 2016-01-06 DIAGNOSIS — M549 Dorsalgia, unspecified: Secondary | ICD-10-CM

## 2016-01-06 DIAGNOSIS — N390 Urinary tract infection, site not specified: Secondary | ICD-10-CM

## 2016-01-06 DIAGNOSIS — J449 Chronic obstructive pulmonary disease, unspecified: Secondary | ICD-10-CM | POA: Diagnosis not present

## 2016-01-06 DIAGNOSIS — Z794 Long term (current) use of insulin: Secondary | ICD-10-CM | POA: Insufficient documentation

## 2016-01-06 DIAGNOSIS — E1165 Type 2 diabetes mellitus with hyperglycemia: Secondary | ICD-10-CM

## 2016-01-06 DIAGNOSIS — R1032 Left lower quadrant pain: Secondary | ICD-10-CM

## 2016-01-06 DIAGNOSIS — E039 Hypothyroidism, unspecified: Secondary | ICD-10-CM | POA: Diagnosis not present

## 2016-01-06 DIAGNOSIS — E785 Hyperlipidemia, unspecified: Secondary | ICD-10-CM | POA: Diagnosis not present

## 2016-01-06 DIAGNOSIS — J45909 Unspecified asthma, uncomplicated: Secondary | ICD-10-CM | POA: Diagnosis not present

## 2016-01-06 DIAGNOSIS — I110 Hypertensive heart disease with heart failure: Secondary | ICD-10-CM | POA: Diagnosis not present

## 2016-01-06 DIAGNOSIS — E1140 Type 2 diabetes mellitus with diabetic neuropathy, unspecified: Secondary | ICD-10-CM | POA: Insufficient documentation

## 2016-01-06 DIAGNOSIS — Z79899 Other long term (current) drug therapy: Secondary | ICD-10-CM | POA: Insufficient documentation

## 2016-01-06 DIAGNOSIS — Z7984 Long term (current) use of oral hypoglycemic drugs: Secondary | ICD-10-CM | POA: Insufficient documentation

## 2016-01-06 DIAGNOSIS — R824 Acetonuria: Secondary | ICD-10-CM

## 2016-01-06 LAB — POCT URINALYSIS DIPSTICK
BILIRUBIN UA: NEGATIVE
Blood, UA: NEGATIVE
GLUCOSE UA: 2000
Ketones, UA: 40
NITRITE UA: NEGATIVE
Spec Grav, UA: 1.025
Urobilinogen, UA: 0.2
pH, UA: 5.5

## 2016-01-06 LAB — COMPREHENSIVE METABOLIC PANEL
ALBUMIN: 4.4 g/dL (ref 3.5–5.0)
ALK PHOS: 132 U/L — AB (ref 38–126)
ALT: 30 U/L (ref 14–54)
AST: 24 U/L (ref 15–41)
BUN: 27 mg/dL — AB (ref 6–20)
CALCIUM: 10.6 mg/dL — AB (ref 8.9–10.3)
CO2: 23 mmol/L (ref 22–32)
Chloride: 98 mmol/L — ABNORMAL LOW (ref 101–111)
Creatinine, Ser: 0.87 mg/dL (ref 0.44–1.00)
GFR calc Af Amer: >60 mL/min (ref >60)
GFR calc non Af Amer: >60 mL/min (ref >60)
GLUCOSE: 321 mg/dL — AB (ref 65–99)
Potassium: 4.5 mmol/L (ref 3.5–5.1)
Sodium: UNDETERMINED mmol/L (ref 135–145)
TOTAL PROTEIN: UNDETERMINED g/dL (ref 6.5–8.1)

## 2016-01-06 LAB — URINALYSIS COMPLETE WITH MICROSCOPIC (ARMC ONLY)
BACTERIA UA: NONE SEEN
Bilirubin Urine: NEGATIVE
GLUCOSE, UA: 150 mg/dL — AB
Hgb urine dipstick: NEGATIVE
NITRITE: NEGATIVE
Protein, ur: 30 mg/dL — AB
SPECIFIC GRAVITY, URINE: 1.035 — AB (ref 1.005–1.030)
pH: 5 (ref 5.0–8.0)

## 2016-01-06 LAB — BASIC METABOLIC PANEL
Anion gap: 11 (ref 5–15)
BUN: 23 mg/dL — AB (ref 6–20)
CALCIUM: 9.2 mg/dL (ref 8.9–10.3)
CO2: 22 mmol/L (ref 22–32)
Chloride: 101 mmol/L (ref 101–111)
Creatinine, Ser: 0.89 mg/dL (ref 0.44–1.00)
GFR calc Af Amer: >60 mL/min (ref >60)
GLUCOSE: 268 mg/dL — AB (ref 65–99)
Potassium: 3.9 mmol/L (ref 3.5–5.1)
Sodium: 134 mmol/L — ABNORMAL LOW (ref 135–145)

## 2016-01-06 LAB — GLUCOSE, CAPILLARY: GLUCOSE-CAPILLARY: 307 mg/dL — AB (ref 65–99)

## 2016-01-06 LAB — CBC
HEMATOCRIT: 38.4 % (ref 35.0–47.0)
Hemoglobin: 13.2 g/dL (ref 12.0–16.0)
MCH: 29.8 pg (ref 26.0–34.0)
MCHC: 34.4 g/dL (ref 32.0–36.0)
MCV: 86.5 fL (ref 80.0–100.0)
Platelets: 271 10*3/uL (ref 150–440)
RBC: 4.44 MIL/uL (ref 3.80–5.20)
RDW: 14.5 % (ref 11.5–14.5)
WBC: 7.6 10*3/uL (ref 3.6–11.0)

## 2016-01-06 LAB — POCT GLYCOSYLATED HEMOGLOBIN (HGB A1C): Hemoglobin A1C: 10.8

## 2016-01-06 LAB — MAGNESIUM: MAGNESIUM: 1.7 mg/dL (ref 1.7–2.4)

## 2016-01-06 LAB — PREGNANCY, URINE: PREG TEST UR: NEGATIVE

## 2016-01-06 MED ORDER — OXYCODONE-ACETAMINOPHEN 5-325 MG PO TABS
1.0000 | ORAL_TABLET | Freq: Four times a day (QID) | ORAL | Status: DC | PRN
Start: 2016-01-06 — End: 2016-03-14

## 2016-01-06 MED ORDER — CEPHALEXIN 500 MG PO CAPS
ORAL_CAPSULE | ORAL | Status: AC
  Administered 2016-01-06: 500 mg via ORAL
  Filled 2016-01-06: qty 1

## 2016-01-06 MED ORDER — OXYCODONE-ACETAMINOPHEN 5-325 MG PO TABS
ORAL_TABLET | ORAL | Status: AC
  Administered 2016-01-06: 1 via ORAL
  Filled 2016-01-06: qty 1

## 2016-01-06 MED ORDER — CEPHALEXIN 500 MG PO CAPS: 500.0000 mg | ORAL_CAPSULE | Freq: Four times a day (QID) | ORAL | Status: AC

## 2016-01-06 MED ORDER — ONDANSETRON HCL 4 MG/2ML IJ SOLN
4.0000 mg | Freq: Once | INTRAMUSCULAR | Status: AC
  Administered 2016-01-06: 4 mg via INTRAVENOUS
  Filled 2016-01-06: qty 2

## 2016-01-06 MED ORDER — SODIUM CHLORIDE 0.9 % IV SOLN
Freq: Once | INTRAVENOUS | Status: AC
  Administered 2016-01-06: 18:00:00 via INTRAVENOUS

## 2016-01-06 MED ORDER — CEPHALEXIN 500 MG PO CAPS
500.0000 mg | ORAL_CAPSULE | Freq: Once | ORAL | Status: AC
  Administered 2016-01-06: 500 mg via ORAL

## 2016-01-06 MED ORDER — OXYCODONE-ACETAMINOPHEN 5-325 MG PO TABS
1.0000 | ORAL_TABLET | Freq: Once | ORAL | Status: AC
  Administered 2016-01-06: 1 via ORAL

## 2016-01-06 MED ORDER — DIATRIZOATE MEGLUMINE & SODIUM 66-10 % PO SOLN
15.0000 mL | Freq: Once | ORAL | Status: AC
Start: 2016-01-06 — End: 2016-01-06
  Administered 2016-01-06: 15 mL via ORAL

## 2016-01-06 MED ORDER — HYDROMORPHONE HCL 1 MG/ML IJ SOLN
0.5000 mg | Freq: Once | INTRAMUSCULAR | Status: AC
  Administered 2016-01-06: 0.5 mg via INTRAVENOUS
  Filled 2016-01-06: qty 1

## 2016-01-06 MED ORDER — IOPAMIDOL (ISOVUE-300) INJECTION 61%
100.0000 mL | Freq: Once | INTRAVENOUS | Status: AC | PRN
  Administered 2016-01-06: 100 mL via INTRAVENOUS

## 2016-01-06 NOTE — Assessment & Plan Note (Addendum)
Uncontrolled; A1c shows poor control; now with ketones in urine, abd pain, CVA tenderness, consider pyelo; patient needs to see endo after ER work-up

## 2016-01-06 NOTE — ED Notes (Signed)
Patient presents to the ED with left lower quadrant abdominal pain and left flank pain.  Patient reports urinary frequency and dysuria.  Patient states her PCP told her to come to the ED because she has a history of kidney failure when she has a UTI.  Patient was told she did have a UTI by her provider, that there were ketones in her urine and it appeared that patient was dehydrated.  Patient is in no obvious distress at this time.

## 2016-01-06 NOTE — ED Notes (Signed)
Lab notified of Magnesium add on

## 2016-01-06 NOTE — ED Notes (Signed)
CBG [id number],Teresa RN made aware of these results

## 2016-01-06 NOTE — ED Provider Notes (Signed)
[facility] Emergency Department Provider Note   ____________________________________________  Time seen: Approximately 4:55 PM  I have reviewed the triage vital signs and the nursing notes.   HISTORY  Chief Complaint Abnormal Lab; Flank Pain; and Pelvic Pain    HPI [name] is a 50 y.o. female who has had a high blood sugar about 500 or higher last couple days. She's been taking her medicine. She is also left lower quadrant pain which got worse today. She went to see her doctor her doctor sent her here for further evaluation. Lower quadrant pain is moderately severe. Just "pain". This worsens with movement or palpation. She reports she gets frequent UTIs and feels something like this and gets renal failure with UTIs. Patient says she is allergic to morphine which gives her anaphylaxis. She says she has taken Dilaudid in the past and it worked well.   Past Medical History  Diagnosis Date  . High blood pressure   . Heart attack ([place])   . Irregular heart rhythm   . Seasonal allergies   . Chronic headaches   . Sleep apnea   . Hyperlipidemia   . COPD (chronic obstructive pulmonary disease) ([place])   . Asthma   . Hypothyroid   . Morbid obesity ([place])   . OSA (obstructive sleep apnea)   . RLS (restless legs syndrome)   . GERD (gastroesophageal reflux disease)   . Bipolar depression ([place])   . Vitamin D deficiency   . Urinary incontinence   . Hypoxemia   . Liver lesion     Favored to be benign on CT; MRI of abd w/contract in Aug 2016. Liver Biopsy-Negative, March 2016  . Acute renal failure ([place])   . Hypomagnesemia   . Hypertension   . Headache   . Diabetes mellitus without complication (HCC)     Type 2, insulin dependent. Patient also takes Metformin.  . CHF (congestive heart failure) ([place])   . Diabetic neuropathy ([place])   . Pinched nerve     Back    Patient Active Problem List   Diagnosis Date Noted  . Adenomatous colon polyp 10/15/2015    . Airway hyperreactivity 10/15/2015  . Bipolar affective disorder ([place]) 10/15/2015  . CN (constipation) 10/15/2015  . CAFL (chronic airflow limitation) ([place]) 10/15/2015  . Drug-induced hepatic toxicity 10/15/2015  . Blood in feces 10/15/2015  . HLD (hyperlipidemia) 10/15/2015  . Hyperthyroidism 10/15/2015  . Hypoxemia 10/15/2015  . Lesion of liver 10/15/2015  . NASH (nonalcoholic steatohepatitis) 10/15/2015  . Adiposity 10/15/2015  . Restless leg syndrome 10/15/2015  . Type 2 diabetes mellitus ([place]) 10/15/2015  . Severe obstructive sleep apnea 10/15/2015  . Elevated alkaline phosphatase level 09/22/2015  . Urinary bladder incontinence 09/22/2015  . Diarrhea 08/18/2015  . Low back pain with left-sided sciatica 08/18/2015  . Encounter for monitoring tricyclic antidepressant therapy 05/13/2015  . Bipolar disorder ([place]) 05/11/2015  . Liver disease 05/11/2015  . Diabetes ([place]) 05/11/2015  . Apnea, sleep 05/05/2015  . Heart valve disease 05/05/2015  . Chronic kidney disease, stage III (moderate) 03/15/2015  . Anemia 03/15/2015  . Migraine 03/12/2015  . COPD (chronic obstructive pulmonary disease) ([place]) 01/20/2015  . 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 GERD (gastroesophageal reflux disease) 01/20/2015  . Disorder of smooth muscle 01/20/2015  . Hepatic encephalopathy ([place]) 01/20/2015  . Hypothyroid   . RLS (restless legs syndrome)   . Vitamin D deficiency   . Urinary incontinence   . Liver lesion   .  Hypomagnesemia   . Obstructive sleep apnea 10/20/2013  . PERIPHERAL NEUROPATHY 03/04/2009  . HEMORRHOIDS, EXTERNAL 09/03/2008  . ALLERGIC RHINITIS 07/04/2007  . ASTHMA, PERSISTENT, MODERATE 07/04/2007  . INTERSTITIAL CYSTITIS 07/04/2007  . HYPERTRIGLYCERIDEMIA 07/03/2007  . FATIGUE, CHRONIC 07/03/2007  . HYPERTENSION, BENIGN ESSENTIAL 06/19/2007    Past Surgical History  Procedure Laterality Date  . Abdominal hysterectomy    . Tubal ligation    . Liver biopsy   March 2016    Negative  . Abdominal hysterectomy      Partial  . Cholecystectomy    . Bladder surgery      x 2  . Lung biopsy      Current Outpatient Rx  Name  Route  Sig  Dispense  Refill  . ACCU-CHEK AVIVA PLUS test strip                 Dispense as written.   Marland Kitchen ACCU-CHEK SOFTCLIX LANCETS lancets               . albuterol (PROVENTIL HFA;VENTOLIN HFA) 108 (90 Base) MCG/ACT inhaler   Inhalation   Inhale 2 puffs into the lungs every 4 (four) hours as needed for wheezing or shortness of breath.   1 Inhaler   1   . amLODipine (NORVASC) 2.5 MG tablet   Oral   Take 1 tablet (2.5 mg total) by mouth daily.   30 tablet   1   . cephALEXin (KEFLEX) 500 MG capsule   Oral   Take 1 capsule (500 mg total) by mouth 4 (four) times daily.   40 capsule   0   . Choline Fenofibrate (FENOFIBRIC ACID) 45 MG CPDR   Oral   Take 1 capsule by mouth daily.   30 capsule   5   . cyclobenzaprine (FLEXERIL) 10 MG tablet   Oral   Take 1 tablet (10 mg total) by mouth every 8 (eight) hours as needed for muscle spasms.   60 tablet   2   . DULoxetine (CYMBALTA) 60 MG capsule   Oral   Take 120 mg by mouth daily.         . fluticasone (FLONASE) 50 MCG/ACT nasal spray   Each Nare   Place 2 sprays into both nostrils daily.   16 g   2   . insulin lispro (HUMALOG) 100 UNIT/ML injection   Subcutaneous   Inject into the skin 3 (three) times daily as needed for high blood sugar. Sliding Scale         . insulin NPH Human (HUMULIN N,NOVOLIN N) 100 UNIT/ML injection   Subcutaneous   Inject into the skin. Reported on 11/19/2015         . lamoTRIgine (LAMICTAL) 150 MG tablet   Oral   Take 150 mg by mouth daily.          Marland Kitchen levothyroxine (SYNTHROID, LEVOTHROID) 75 MCG tablet      TAKE 1 TABLET BY MOUTH EVERY DAY.   30 tablet   11   . LINZESS 145 MCG CAPS capsule   Oral   Take 145 mcg by mouth daily.            Dispense as written.   . magnesium oxide (MAG-OX) 400 MG  tablet   Oral   Take 1 tablet (400 mg total) by mouth 3 (three) times daily.   90 tablet   2   . meloxicam (MOBIC) 15 MG tablet   Oral   Take 15  mg by mouth daily.         . metoprolol tartrate (LOPRESSOR) 25 MG tablet   Oral   Take 0.5 tablets (12.5 mg total) by mouth 2 (two) times daily.   30 tablet   5   . Multiple Vitamin (MULTIVITAMIN) capsule   Oral   Take 1 capsule by mouth daily.          . ondansetron (ZOFRAN) 4 MG tablet   Oral   Take 1 tablet (4 mg total) by mouth daily as needed for nausea or vomiting.   20 tablet   1   . oxyCODONE-acetaminophen (ROXICET) 5-325 MG tablet   Oral   Take 1 tablet by mouth every 6 (six) hours as needed.   10 tablet   0   . promethazine-dextromethorphan (PROMETHAZINE-DM) 6.25-15 MG/5ML syrup   Oral   Take 5 mLs by mouth 4 (four) times daily as needed for cough.   118 mL   0   . QUEtiapine (SEROQUEL) 25 MG tablet   Oral   Take 25 mg by mouth every morning. Take one in am, one at lunch         . QUEtiapine (SEROQUEL) 400 MG tablet   Oral   Take 800 mg by mouth at bedtime. Take 2 tabs at bedtime         . ranitidine (ZANTAC) 300 MG tablet   Oral   Take 1 tablet (300 mg total) by mouth at bedtime.   30 tablet   5   . REXULTI 1 MG TABS   Oral   Take 1 mg by mouth daily.            Dispense as written.   . topiramate (TOPAMAX) 25 MG tablet   Oral   Take 1 tablet (25 mg total) by mouth 2 (two) times daily.   180 tablet   1     Patient needs to schedule appt with Dr.Sethi to co ...   . zolpidem (AMBIEN) 10 MG tablet   Oral   Take 10 mg by mouth at bedtime.            Allergies Morphine and related; Codeine; Codeine; Hydrocodone-acetaminophen; Mirtazapine; Morphine and related; Sulfamethoxazole-trimethoprim; and Vicodin  Family History  Problem Relation Age of Onset  . Heart disease Maternal Grandfather   . Rheum arthritis Maternal Grandfather   . Cancer Maternal Grandfather     liver  .  Cancer Paternal Grandmother     lung  . Diabetes Mother   . Heart disease Mother   . Hyperlipidemia Mother   . Hypertension Mother   . Kidney disease Mother   . Mental illness Mother   . Hypothyroidism Mother   . Stroke Mother   . Osteoporosis Mother   . Glaucoma Mother   . Congestive Heart Failure Mother   . Hypertension Father   . Asthma Daughter   . Cancer Daughter     throat    Social History Social History  Substance Use Topics  . Smoking status: Never Smoker   . Smokeless tobacco: Never Used  . Alcohol Use: No    Review of Systems Constitutional: No fever/chills Eyes: No visual changes. ENT: No sore throat. Cardiovascular: Denies chest pain. Respiratory: Denies shortness of breath. Gastrointestinal: See history of present illness Genitourinary: Negative for dysuria. Musculoskeletal: Negative for back pain. Skin: Negative for rash. Neurological: Negative for headaches, focal weakness or numbness.  10-point ROS otherwise negative.  ____________________________________________   PHYSICAL EXAM:  VITAL SIGNS: ED Triage Vitals  Enc Vitals Group     BP 01/06/16 1534 139/80 mmHg     Pulse Rate 01/06/16 1534 99     Resp 01/06/16 1534 18     Temp 01/06/16 1534 98.4 F (36.9 C)     Temp Source 01/06/16 1534 Oral     SpO2 01/06/16 1534 97 %     Weight 01/06/16 1534 186 lb (84.369 kg)     Height 01/06/16 1534 4' 11"  (1.499 m)     Head Cir --      [facility] Flow --      Pain Score 01/06/16 1535 8     Pain Loc --      Pain Edu? --      Excl. in [place]? --     Constitutional: Alert and oriented. Well appearing and in no acute distress. Eyes: Conjunctivae are normal. PERRL. EOMI. Head: Atraumatic. Nose: No congestion/rhinnorhea. Mouth/Throat: Mucous membranes are moist.  Oropharynx non-erythematous. Neck: No stridor.   Cardiovascular: Normal rate, regular rhythm. Grossly normal heart sounds.  Good peripheral circulation. Respiratory: Normal respiratory effort.  No  retractions. Lungs CTAB. Gastrointestinal: Soft Tender to palpation percussion left lower quadrant.. No distention. No abdominal bruits. No CVA tenderness. Musculoskeletal: No lower extremity tenderness nor edema.  No joint effusions. Neurologic:  Normal speech and language. No gross focal neurologic deficits are appreciated. No gait instability. Skin:  Skin is warm, dry and intact. No rash noted. Psychiatric: Mood and affect are normal. Speech and behavior are normal.  ____________________________________________   LABS (all labs ordered are listed, but only abnormal results are displayed)  Labs Reviewed  COMPREHENSIVE METABOLIC PANEL - Abnormal; Notable for the following:    Chloride 98 (*)    Glucose, Bld 321 (*)    BUN 27 (*)    Calcium 10.6 (*)    Alkaline Phosphatase 132 (*)    Total Bilirubin <0.1 (*)    All other components within normal limits  URINALYSIS COMPLETEWITH MICROSCOPIC ([facility] ONLY) - Abnormal; Notable for the following:    Color, Urine YELLOW (*)    APPearance CLOUDY (*)    Glucose, UA 150 (*)    Ketones, ur TRACE (*)    Specific Gravity, Urine 1.035 (*)    Protein, ur 30 (*)    Leukocytes, UA 3+ (*)    Squamous Epithelial / LPF 6-30 (*)    All other components within normal limits  GLUCOSE, CAPILLARY - Abnormal; Notable for the following:    Glucose-Capillary 307 (*)    All other components within normal limits  BASIC METABOLIC PANEL - Abnormal; Notable for the following:    Sodium 134 (*)    Glucose, Bld 268 (*)    BUN 23 (*)    All other components within normal limits  CBC  PREGNANCY, URINE  MAGNESIUM  CBG MONITORING, ED   ____________________________________________  EKG  KG read and interpreted by me shows normal sinus rhythm a rate of 100 left axis nonspecific ST-T wave changes ____________________________________________  RADIOLOGY  CT shows no acute pathology per  radiology ____________________________________________   PROCEDURES   ____________________________________________   INITIAL IMPRESSION / ASSESSMENT AND PLAN / ED COURSE  Pertinent labs & imaging results that were available during my care of the patient were reviewed by me and considered in my medical decision making (see chart for details).  The only problem I can find with the patient present is UTI and abdominal pain. I'll treat the UTI give her  some pain medicine if she needs it and she says she does. Turn if she is worse. Patient says she can take Percocet without any difficulty. ____________________________________________   FINAL CLINICAL IMPRESSION(S) / ED DIAGNOSES  Final diagnoses:  Urinary tract infection with hematuria, site unspecified      NEW MEDICATIONS STARTED DURING THIS VISIT:  New Prescriptions   CEPHALEXIN (KEFLEX) 500 MG CAPSULE    Take 1 capsule (500 mg total) by mouth 4 (four) times daily.   OXYCODONE-ACETAMINOPHEN (ROXICET) 5-325 MG TABLET    Take 1 tablet by mouth every 6 (six) hours as needed.     Note:  This document was prepared using Dragon voice recognition software and may include unintentional dictation errors.    Nena Polio, MD 01/06/16 2022

## 2016-01-06 NOTE — ED Notes (Signed)
Pt reports that she was sent to the er from her PCP due to her PCP wanting labs done at the hospital today instead of waiting on labs to result out tomorrow - per pt PCP stated she "needs fluids" - pt c/o severe lower left abd pain that radiates into lower back - the PCP verified that she has a UTI per pt account - per pt PCP stated she may be in DKA - pt reports FSBS have been reading 400 and high (her meter only goes to 600) this is with her taking insulin - she takes humulin N 75 units in am 50 units at noon and 75 units at supper and humalog SS QID

## 2016-01-06 NOTE — Progress Notes (Signed)
BP 132/86 mmHg  Pulse 97  Temp(Src) 98.3 F (36.8 C) (Oral)  Resp 16  Wt 186 lb (84.369 kg)  SpO2 96%   Subjective:    Patient ID: [name], female    DOB: 1965-11-17, 50 y.o.   MRN: [id number]  HPI: [name] is a 50 y.o. female  Chief Complaint  Patient presents with  . Follow-up  . Diabetes  . Urinary Tract Infection    LLQ and burning, onset 3 days   Patient is well-known to me from my last practice Panic and anxiety; her mother died 2022-11-29 and she has been depresssed and crying ever since No thoughts of hurting herself; she says she just doesn't care Not leaving the house at all, every thing is blah and gray Trying to make it until May 15th to see Dr. Randel Books; she asked if he could give her xanax or if I could give her something until she gets to see him  Cramping and drawing up of the toes; no energy; last K+ checked was critically low 2.8  Left side pain, started a few days ago; urine has foul odor, dysuria; no fevers; no blood; patient has frequent UTIs and get renal failure  She was in the ER with norovirus April 9th; that lasted like a week she says  She has not seen endocrinologist; A1c is 10.8 today; blood sugars are so high, they won't read at times, over 600 at home on her glucometer  She wears oxygen at night and has raw sore spot on the right ear; has happened in the past, cleared up and came back  Depression screen [facility] 2/9 01/06/2016  Decreased Interest 0  Down, Depressed, Hopeless 1  PHQ - 2 Score 1   Relevant past medical, surgical, family and social history reviewed Past Medical History  Diagnosis Date  . High blood pressure   . Heart attack ([place])   . Irregular heart rhythm   . Seasonal allergies   . Chronic headaches   . Sleep apnea   . Hyperlipidemia   . COPD (chronic obstructive pulmonary disease) ([place])   . Asthma   . Hypothyroid   . Morbid obesity ([place])   . OSA (obstructive sleep apnea)   . RLS (restless legs syndrome)   .  GERD (gastroesophageal reflux disease)   . Bipolar depression ([place])   . Vitamin D deficiency   . Urinary incontinence   . Hypoxemia   . Liver lesion     Favored to be benign on CT; MRI of abd w/contract in Aug 2016. Liver Biopsy-Negative, November 29, 2014  . Acute renal failure ([place])   . Hypomagnesemia   . Hypertension   . Headache   . Diabetes mellitus without complication (HCC)     Type 2, insulin dependent. Patient also takes Metformin.  . CHF (congestive heart failure) ([place])   . Diabetic neuropathy ([place])   . Pinched nerve     Back   Past Surgical History  Procedure Laterality Date  . Abdominal hysterectomy    . Tubal ligation    . Liver biopsy  29-Nov-2014    Negative  . Abdominal hysterectomy      Partial  . Cholecystectomy    . Bladder surgery      x 2  . Lung biopsy     Family History  Problem Relation Age of Onset  . Heart disease Maternal Grandfather   . Rheum arthritis Maternal Grandfather   . Cancer Maternal Grandfather  liver  . Cancer Paternal Grandmother     lung  . Diabetes Mother   . Heart disease Mother   . Hyperlipidemia Mother   . Hypertension Mother   . Kidney disease Mother   . Mental illness Mother   . Hypothyroidism Mother   . Stroke Mother   . Osteoporosis Mother   . Glaucoma Mother   . Congestive Heart Failure Mother   . Hypertension Father   . Asthma Daughter   . Cancer Daughter     throat   Social History  Substance Use Topics  . Smoking status: Never Smoker   . Smokeless tobacco: Never Used  . Alcohol Use: No   Interim medical history since last visit reviewed. Allergies and medications reviewed  Review of Systems Per HPI unless specifically indicated above     Objective:    BP 132/86 mmHg  Pulse 97  Temp(Src) 98.3 F (36.8 C) (Oral)  Resp 16  Wt 186 lb (84.369 kg)  SpO2 96%  Wt Readings from Last 3 Encounters:  01/13/16 186 lb (84.369 kg)  01/06/16 186 lb (84.369 kg)  01/06/16 186 lb (84.369 kg)    Physical  Exam  Constitutional: She appears well-developed and well-nourished.  Obese, weight stable  Cardiovascular: Normal rate and regular rhythm.   Pulmonary/Chest: Effort normal and breath sounds normal. No respiratory distress.  Abdominal: She exhibits distension (obese). There is tenderness. There is CVA tenderness.  Musculoskeletal: Normal range of motion. She exhibits no edema.  Neurological: She is alert.  Skin: Lesion (shallow ulcerated lesion, right helix) noted. No pallor.  Psychiatric: Her mood appears anxious. Her affect is not blunt, not labile and not inappropriate. Her speech is not rapid and/or pressured. She is not slowed. Cognition and memory are not impaired. She exhibits a depressed mood. She is attentive.   Diabetic Foot Form - Detailed   Diabetic Foot Exam - detailed  Diabetic Foot exam was performed with the following findings:  Yes 01/06/2016 12:29 PM  Are the toenails long?:  No  Are the toenails thick?:  No  Are the toenails ingrown?:  No    Pulse Foot Exam completed.:  Yes  Right Dorsalis Pedis:  Present Left Dorsalis Pedis:  Present  Sensory Foot Exam Completed.:  Yes  Semmes-Weinstein Monofilament Test  R Site 1-Great Toe:  Pos L Site 1-Great Toe:  Pos  R Site 4:  Pos L Site 4:  Pos  R Site 5:  Pos L Site 5:  Pos       Results for orders placed or performed in visit on 01/06/16  Urine culture  Result Value Ref Range   Urine Culture, Routine Final report    Urine Culture result 1 Comment   POCT HgB A1C  Result Value Ref Range   Hemoglobin A1C 10.8   POCT urinalysis dipstick  Result Value Ref Range   Color, UA yellow    Clarity, UA clear    Glucose, UA 2000    Bilirubin, UA neg    Ketones, UA 40    Spec Grav, UA 1.025    Blood, UA neg    pH, UA 5.5    Protein, UA trace    Urobilinogen, UA 0.2    Nitrite, UA neg    Leukocytes, UA Trace (A) Negative      Assessment & Plan:   Problem List Items Addressed This Visit      Endocrine   Type 2  diabetes mellitus ([place]) - Primary  Uncontrolled; A1c shows poor control; now with ketones in urine, abd pain, CVA tenderness, consider pyelo; patient needs to see endo after ER work-up      Relevant Orders   POCT HgB A1C (Completed)     Other   Hypomagnesemia    Hx of low Mg2+ now with cramping and drawing of toes; poorly controlled diabetic, on meds too which could deplete Mg2+ and impair absorption from GI tract       Other Visit Diagnoses    LLQ pain        with CVA tenderness, +LE on urine dip, along with ketones; send to ER    Relevant Orders    POCT urinalysis dipstick (Completed)    CVA tenderness        with ketones, positive LE on urine in uncontrolled diabetic; consider pyelo; sending to ER    Relevant Orders    Urine culture (Completed)    Ketonuria        in poorly controlled diabetic; may need IVF, insulin, abd scan (with CVA tendernes); send to ER    Grief reaction        after losing mother; I will not prescribe her any benzos, but encouraged her to work with her therapist and psychiatrist; supportive listening provided        Follow up plan: No Follow-up on file.  An after-visit summary was printed and given to the patient at [facility].  Please see the patient instructions which may contain other information and recommendations beyond what is mentioned above in the assessment and plan.  No orders of the defined types were placed in this encounter.    Orders Placed This Encounter  Procedures  . Urine culture  . POCT HgB A1C  . POCT urinalysis dipstick

## 2016-01-06 NOTE — ED Notes (Signed)
Urine pregnancy not completed due to pt had a hysterectomy - Dr Cinda Quest notified

## 2016-01-06 NOTE — ED Notes (Signed)
Lab reports pt's blood is too lipemic to result Sodium and total protein level. Spoke with Dr Archie Balboa and he said do not sent out labs for those results.

## 2016-01-06 NOTE — ED Notes (Signed)
Sent in by PCP with ketones in urine  Reported blood sugars at home over 600 and flank tenderness

## 2016-01-07 DIAGNOSIS — M549 Dorsalgia, unspecified: Secondary | ICD-10-CM | POA: Diagnosis not present

## 2016-01-08 LAB — URINE CULTURE

## 2016-01-10 ENCOUNTER — Telehealth: Payer: Self-pay | Admitting: Family Medicine

## 2016-01-10 NOTE — Telephone Encounter (Signed)
I called pt to clarify message, she states that you said you would call her psychiatrist and see if he would give her something for anxiety?

## 2016-01-10 NOTE — Telephone Encounter (Signed)
I just called Dr. Miles Costain I spoke with someone, then was left on hold for more than 16 minutes I finally hung up Please call (he is in her care team), and just let him know I was trying to reach him; she has been very upset after the death of her mother and would appreciate someone from his staff reaching out to her about her medicines; she is very anxious Thank you

## 2016-01-10 NOTE — Telephone Encounter (Signed)
Was seen on last Wednesday and was told that you would call her psychologist to get refill on medication for anxiety attack until she could be seen on the 16th. Patient went to ER on Wednesday was told she was dehydrated and was given fluids and antibiotics. Patient is checking status on anxiety meds please call Dr Ernie Hew at [facility].

## 2016-01-11 NOTE — Telephone Encounter (Signed)
Called [facility] left message for nurse to talk with dr. Ernie Hew

## 2016-01-12 ENCOUNTER — Other Ambulatory Visit: Payer: Self-pay

## 2016-01-12 MED ORDER — MAGNESIUM OXIDE 400 MG PO TABS: 400.0000 mg | ORAL_TABLET | Freq: Three times a day (TID) | ORAL | Status: DC

## 2016-01-12 NOTE — Telephone Encounter (Signed)
Patient should request Rexulti from her psychiatrist please; thank you

## 2016-01-13 ENCOUNTER — Emergency Department
Admission: EM | Admit: 2016-01-13 | Discharge: 2016-01-13 | Disposition: A | Discharge status: Home / Self Care | Payer: Medicare Other | Attending: Emergency Medicine | Admitting: Emergency Medicine

## 2016-01-13 ENCOUNTER — Telehealth: Payer: Self-pay

## 2016-01-13 ENCOUNTER — Encounter: Payer: Self-pay | Admitting: *Deleted

## 2016-01-13 DIAGNOSIS — J4540 Moderate persistent asthma, uncomplicated: Secondary | ICD-10-CM | POA: Diagnosis not present

## 2016-01-13 DIAGNOSIS — F3130 Bipolar disorder, current episode depressed, mild or moderate severity, unspecified: Secondary | ICD-10-CM | POA: Diagnosis not present

## 2016-01-13 DIAGNOSIS — E785 Hyperlipidemia, unspecified: Secondary | ICD-10-CM | POA: Insufficient documentation

## 2016-01-13 DIAGNOSIS — N183 Chronic kidney disease, stage 3 (moderate): Secondary | ICD-10-CM | POA: Diagnosis not present

## 2016-01-13 DIAGNOSIS — Z79899 Other long term (current) drug therapy: Secondary | ICD-10-CM | POA: Diagnosis not present

## 2016-01-13 DIAGNOSIS — E1122 Type 2 diabetes mellitus with diabetic chronic kidney disease: Secondary | ICD-10-CM | POA: Insufficient documentation

## 2016-01-13 DIAGNOSIS — R739 Hyperglycemia, unspecified: Secondary | ICD-10-CM

## 2016-01-13 DIAGNOSIS — E1165 Type 2 diabetes mellitus with hyperglycemia: Secondary | ICD-10-CM | POA: Diagnosis not present

## 2016-01-13 DIAGNOSIS — E039 Hypothyroidism, unspecified: Secondary | ICD-10-CM | POA: Diagnosis not present

## 2016-01-13 DIAGNOSIS — E1140 Type 2 diabetes mellitus with diabetic neuropathy, unspecified: Secondary | ICD-10-CM | POA: Diagnosis not present

## 2016-01-13 DIAGNOSIS — E162 Hypoglycemia, unspecified: Secondary | ICD-10-CM | POA: Diagnosis not present

## 2016-01-13 DIAGNOSIS — R531 Weakness: Secondary | ICD-10-CM

## 2016-01-13 DIAGNOSIS — I130 Hypertensive heart and chronic kidney disease with heart failure and stage 1 through stage 4 chronic kidney disease, or unspecified chronic kidney disease: Secondary | ICD-10-CM | POA: Diagnosis not present

## 2016-01-13 DIAGNOSIS — J449 Chronic obstructive pulmonary disease, unspecified: Secondary | ICD-10-CM | POA: Insufficient documentation

## 2016-01-13 DIAGNOSIS — I509 Heart failure, unspecified: Secondary | ICD-10-CM | POA: Insufficient documentation

## 2016-01-13 LAB — BASIC METABOLIC PANEL
Anion gap: 10 (ref 5–15)
BUN: 18 mg/dL (ref 6–20)
BUN: 15 mg/dL (ref 6–20)
CHLORIDE: 92 mmol/L — AB (ref 101–111)
CHLORIDE: 98 mmol/L — AB (ref 101–111)
CO2: 22 mmol/L (ref 22–32)
CO2: 23 mmol/L (ref 22–32)
CREATININE: 0.83 mg/dL (ref 0.44–1.00)
Calcium: 9.5 mg/dL (ref 8.9–10.3)
Calcium: 8.7 mg/dL — ABNORMAL LOW (ref 8.9–10.3)
Creatinine, Ser: 1.08 mg/dL — ABNORMAL HIGH (ref 0.44–1.00)
GFR calc Af Amer: >60 mL/min (ref >60)
GFR calc non Af Amer: 58 mL/min — ABNORMAL LOW (ref >60)
GFR calc non Af Amer: >60 mL/min (ref >60)
GLUCOSE: 304 mg/dL — AB (ref 65–99)
Glucose, Bld: 430 mg/dL — ABNORMAL HIGH (ref 65–99)
POTASSIUM: UNDETERMINED mmol/L (ref 3.5–5.1)
Potassium: 4.9 mmol/L (ref 3.5–5.1)
SODIUM: UNDETERMINED mmol/L (ref 135–145)
Sodium: 131 mmol/L — ABNORMAL LOW (ref 135–145)

## 2016-01-13 LAB — URINALYSIS COMPLETE WITH MICROSCOPIC (ARMC ONLY)
Bacteria, UA: NONE SEEN
Bilirubin Urine: NEGATIVE
Glucose, UA: >500 mg/dL — AB
Hgb urine dipstick: NEGATIVE
KETONES UR: NEGATIVE mg/dL
LEUKOCYTES UA: NEGATIVE
Nitrite: NEGATIVE
PH: 7 (ref 5.0–8.0)
PROTEIN: NEGATIVE mg/dL
SPECIFIC GRAVITY, URINE: 1.026 (ref 1.005–1.030)

## 2016-01-13 LAB — LIPID PANEL
CHOLESTEROL: 517 mg/dL — AB (ref 0–200)
LDL Cholesterol: UNDETERMINED mg/dL (ref 0–99)
Triglycerides: >5000 mg/dL — ABNORMAL HIGH (ref <150)
VLDL: UNDETERMINED mg/dL (ref 0–40)

## 2016-01-13 LAB — CBC
HCT: 35.3 % (ref 35.0–47.0)
HEMOGLOBIN: 13 g/dL (ref 12.0–16.0)
MCH: 31.8 pg (ref 26.0–34.0)
MCHC: 36.7 g/dL — ABNORMAL HIGH (ref 32.0–36.0)
MCV: 86.7 fL (ref 80.0–100.0)
PLATELETS: 312 10*3/uL (ref 150–440)
RBC: 4.08 MIL/uL (ref 3.80–5.20)
RDW: 14.2 % (ref 11.5–14.5)
WBC: 8 10*3/uL (ref 3.6–11.0)

## 2016-01-13 LAB — GLUCOSE, CAPILLARY: Glucose-Capillary: 300 mg/dL — ABNORMAL HIGH (ref 65–99)

## 2016-01-13 MED ORDER — KETOROLAC TROMETHAMINE 30 MG/ML IJ SOLN
INTRAMUSCULAR | Status: AC
  Administered 2016-01-13: 30 mg via INTRAVENOUS
  Filled 2016-01-13: qty 1

## 2016-01-13 MED ORDER — KETOROLAC TROMETHAMINE 30 MG/ML IJ SOLN
30.0000 mg | Freq: Once | INTRAMUSCULAR | Status: AC
  Administered 2016-01-13: 30 mg via INTRAVENOUS

## 2016-01-13 MED ORDER — INSULIN ASPART 100 UNIT/ML ~~LOC~~ SOLN
5.0000 [IU] | Freq: Once | SUBCUTANEOUS | Status: AC
  Administered 2016-01-13: 5 [IU] via SUBCUTANEOUS
  Filled 2016-01-13: qty 5

## 2016-01-13 MED ORDER — ONDANSETRON HCL 4 MG/2ML IJ SOLN
4.0000 mg | Freq: Once | INTRAMUSCULAR | Status: AC
  Administered 2016-01-13: 4 mg via INTRAVENOUS
  Filled 2016-01-13: qty 2

## 2016-01-13 MED ORDER — SODIUM CHLORIDE 0.9 % IV SOLN
Freq: Once | INTRAVENOUS | Status: AC
  Administered 2016-01-13: 19:00:00 via INTRAVENOUS

## 2016-01-13 NOTE — Telephone Encounter (Signed)
Patient called states her blood glucose has been running over 600 for the past 2 days.  Pt was told to go to ER and agreed

## 2016-01-13 NOTE — ED Provider Notes (Signed)
[facility] Emergency Department Provider Note        Time seen: ----------------------------------------- 6:41 PM on 01/13/2016 -----------------------------------------    I have reviewed the triage vital signs and the nursing notes.   HISTORY  Chief Complaint Hyperglycemia    HPI [name] is a 50 y.o. female who presents to ER for high blood sugars. Patient feels tired and weak, also nauseated. Patient also was recently treated for UTI was encouraged to come here to be checked by her doctor. She denies fevers chills or other complaints.   Past Medical History  Diagnosis Date  . High blood pressure   . Heart attack ([facility])   . Irregular heart rhythm   . Seasonal allergies   . Chronic headaches   . Sleep apnea   . Hyperlipidemia   . COPD (chronic obstructive pulmonary disease) ([place])   . Asthma   . Hypothyroid   . Morbid obesity ([place])   . OSA (obstructive sleep apnea)   . RLS (restless legs syndrome)   . GERD (gastroesophageal reflux disease)   . Bipolar depression ([place])   . Vitamin D deficiency   . Urinary incontinence   . Hypoxemia   . Liver lesion     Favored to be benign on CT; MRI of abd w/contract in Aug 2016. Liver Biopsy-Negative, March 2016  . Acute renal failure ([place])   . Hypomagnesemia   . Hypertension   . Headache   . Diabetes mellitus without complication (HCC)     Type 2, insulin dependent. Patient also takes Metformin.  . CHF (congestive heart failure) ([place])   . Diabetic neuropathy ([place])   . Pinched nerve     Back    Patient Active Problem List   Diagnosis Date Noted  . Adenomatous colon polyp 10/15/2015  . Airway hyperreactivity 10/15/2015  . Bipolar affective disorder ([place]) 10/15/2015  . CN (constipation) 10/15/2015  . CAFL (chronic airflow limitation) ([place]) 10/15/2015  . Drug-induced hepatic toxicity 10/15/2015  . Blood in feces 10/15/2015  . HLD (hyperlipidemia) 10/15/2015  . Hyperthyroidism 10/15/2015   . Hypoxemia 10/15/2015  . Lesion of liver 10/15/2015  . NASH (nonalcoholic steatohepatitis) 10/15/2015  . Adiposity 10/15/2015  . Restless leg syndrome 10/15/2015  . Type 2 diabetes mellitus ([place]) 10/15/2015  . Severe obstructive sleep apnea 10/15/2015  . Elevated alkaline phosphatase level 09/22/2015  . Urinary bladder incontinence 09/22/2015  . Diarrhea 08/18/2015  . Low back pain with left-sided sciatica 08/18/2015  . Encounter for monitoring tricyclic antidepressant therapy 05/13/2015  . Bipolar disorder ([place]) 05/11/2015  . Liver disease 05/11/2015  . Diabetes ([place]) 05/11/2015  . Apnea, sleep 05/05/2015  . Heart valve disease 05/05/2015  . Chronic kidney disease, stage III (moderate) 03/15/2015  . Anemia 03/15/2015  . Migraine 03/12/2015  . COPD (chronic obstructive pulmonary disease) ([place]) 01/20/2015  . 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 GERD (gastroesophageal reflux disease) 01/20/2015  . Disorder of smooth muscle 01/20/2015  . Hepatic encephalopathy ([place]) 01/20/2015  . Hypothyroid   . RLS (restless legs syndrome)   . Vitamin D deficiency   . Urinary incontinence   . Liver lesion   . Hypomagnesemia   . Obstructive sleep apnea 10/20/2013  . PERIPHERAL NEUROPATHY 03/04/2009  . HEMORRHOIDS, EXTERNAL 09/03/2008  . ALLERGIC RHINITIS 07/04/2007  . ASTHMA, PERSISTENT, MODERATE 07/04/2007  . INTERSTITIAL CYSTITIS 07/04/2007  . HYPERTRIGLYCERIDEMIA 07/03/2007  . FATIGUE, CHRONIC 07/03/2007  . HYPERTENSION, BENIGN ESSENTIAL 06/19/2007    Past Surgical History  Procedure  Laterality Date  . Abdominal hysterectomy    . Tubal ligation    . Liver biopsy  March 2016    Negative  . Abdominal hysterectomy      Partial  . Cholecystectomy    . Bladder surgery      x 2  . Lung biopsy      Allergies Morphine and related; Codeine; Codeine; Hydrocodone-acetaminophen; Mirtazapine; Morphine and related; Sulfamethoxazole-trimethoprim; and Vicodin  Social  History Social History  Substance Use Topics  . Smoking status: Never Smoker   . Smokeless tobacco: Never Used  . Alcohol Use: No    Review of Systems Constitutional: Negative for fever. Eyes: Negative for visual changes. ENT: Negative for sore throat. Cardiovascular: Negative for chest pain. Respiratory: Negative for shortness of breath. Gastrointestinal: Negative for abdominal pain, Positive for nausea Genitourinary: Positive for dysuria Musculoskeletal: Negative for back pain. Skin: Negative for rash. Neurological: Negative for headaches,Positive for weakness  10-point ROS otherwise negative.  ____________________________________________   PHYSICAL EXAM:  VITAL SIGNS: ED Triage Vitals  Enc Vitals Group     BP 01/13/16 1656 148/72 mmHg     Pulse Rate 01/13/16 1656 101     Resp 01/13/16 1656 20     Temp 01/13/16 1656 98.6 F (37 C)     Temp Source 01/13/16 1656 Oral     SpO2 01/13/16 1656 96 %     Weight 01/13/16 1656 186 lb (84.369 kg)     Height 01/13/16 1656 4' 11"  (1.499 m)     Head Cir --      Peak Flow --      Pain Score --      Pain Loc --      Pain Edu? --      Excl. in [place]? --     Constitutional: Alert and oriented. Well appearing and in no distress. Eyes: Conjunctivae are normal. PERRL. Normal extraocular movements. ENT   Head: Normocephalic and atraumatic.   Nose: No congestion/rhinnorhea.   Mouth/Throat: Mucous membranes are moist.   Neck: No stridor. Cardiovascular: Normal rate, regular rhythm. No murmurs, rubs, or gallops. Respiratory: Normal respiratory effort without tachypnea nor retractions. Breath sounds are clear and equal bilaterally. No wheezes/rales/rhonchi. Gastrointestinal: Soft and nontender. Normal bowel sounds Musculoskeletal: Nontender with normal range of motion in all extremities. No lower extremity tenderness nor edema. Neurologic:  Normal speech and language. No gross focal neurologic deficits are appreciated.   Skin:  Skin is warm, dry and intact. No rash noted. Psychiatric: Mood and affect are normal. Speech and behavior are normal.   ____________________________________________  ED COURSE:  Pertinent labs & imaging results that were available during my care of the patient were reviewed by me and considered in my medical decision making (see chart for details). Patient presents to ER for hyperglycemia, will check basic labs and give IV fluid. ____________________________________________    LABS (pertinent positives/negatives)  Labs Reviewed  BASIC METABOLIC PANEL - Abnormal; Notable for the following:    Chloride 92 (*)    Glucose, Bld 430 (*)    Creatinine, Ser 1.08 (*)    GFR calc non Af Amer 58 (*)    All other components within normal limits  CBC - Abnormal; Notable for the following:    MCHC 36.7 (*)    All other components within normal limits  URINALYSIS COMPLETEWITH MICROSCOPIC ([facility] ONLY) - Abnormal; Notable for the following:    Color, Urine STRAW (*)    APPearance CLEAR (*)    Glucose,  UA >500 (*)    Squamous Epithelial / LPF 0-5 (*)    All other components within normal limits  GLUCOSE, CAPILLARY - Abnormal; Notable for the following:    Glucose-Capillary 300 (*)    All other components within normal limits  LIPID PANEL  BASIC METABOLIC PANEL  CBG MONITORING, ED   ____________________________________________  FINAL ASSESSMENT AND PLAN  Hyperglycemia, Weakness  Plan: Patient with labs  as dictated above. Patient is currently feeling better, blood sugars have improved. She is stable for outpatient follow-up with her doctor. She is given a follow-up tomorrow with endocrinology to adjust her blood sugars.   Earleen [place], MD   Note: This dictation was prepared with Dragon dictation. Any transcriptional errors that result from this process are unintentional   Earleen [place], MD 01/13/16 2107

## 2016-01-13 NOTE — ED Notes (Signed)
Discussed discharge instructions and follow-up care with patient. No questions or concerns at this time. Pt stable at discharge.  

## 2016-01-13 NOTE — Discharge Instructions (Signed)
Hyperglycemia Hyperglycemia occurs when the glucose (sugar) in your blood is too high. Hyperglycemia can happen for many reasons, but it most often happens to people who do not know they have diabetes or are not managing their diabetes properly.  CAUSES  Whether you have diabetes or not, there are other causes of hyperglycemia. Hyperglycemia can occur when you have diabetes, but it can also occur in other situations that you might not be as aware of, such as: Diabetes  If you have diabetes and are having problems controlling your blood glucose, hyperglycemia could occur because of some of the following reasons:  Not following your meal plan.  Not taking your diabetes medications or not taking it properly.  Exercising less or doing less activity than you normally do.  Being sick. Pre-diabetes  This cannot be ignored. Before people develop Type 2 diabetes, they almost always have "pre-diabetes." This is when your blood glucose levels are higher than normal, but not yet high enough to be diagnosed as diabetes. Research has shown that some long-term damage to the body, especially the heart and circulatory system, may already be occurring during pre-diabetes. If you take action to manage your blood glucose when you have pre-diabetes, you may delay or prevent Type 2 diabetes from developing. Stress  If you have diabetes, you may be "diet" controlled or on oral medications or insulin to control your diabetes. However, you may find that your blood glucose is higher than usual in the hospital whether you have diabetes or not. This is often referred to as "stress hyperglycemia." Stress can elevate your blood glucose. This happens because of hormones put out by the body during times of stress. If stress has been the cause of your high blood glucose, it can be followed regularly by your caregiver. That way he/she can make sure your hyperglycemia does not continue to get worse or progress to  diabetes. Steroids  Steroids are medications that act on the infection fighting system (immune system) to block inflammation or infection. One side effect can be a rise in blood glucose. Most people can produce enough extra insulin to allow for this rise, but for those who cannot, steroids make blood glucose levels go even higher. It is not unusual for steroid treatments to "uncover" diabetes that is developing. It is not always possible to determine if the hyperglycemia will go away after the steroids are stopped. A special blood test called an A1c is sometimes done to determine if your blood glucose was elevated before the steroids were started. SYMPTOMS  Thirsty.  Frequent urination.  Dry mouth.  Blurred vision.  Tired or fatigue.  Weakness.  Sleepy.  Tingling in feet or leg. DIAGNOSIS  Diagnosis is made by monitoring blood glucose in one or all of the following ways:  A1c test. This is a chemical found in your blood.  Fingerstick blood glucose monitoring.  Laboratory results. TREATMENT  First, knowing the cause of the hyperglycemia is important before the hyperglycemia can be treated. Treatment may include, but is not be limited to:  Education.  Change or adjustment in medications.  Change or adjustment in meal plan.  Treatment for an illness, infection, etc.  More frequent blood glucose monitoring.  Change in exercise plan.  Decreasing or stopping steroids.  Lifestyle changes. HOME CARE INSTRUCTIONS   Test your blood glucose as directed.  Exercise regularly. Your caregiver will give you instructions about exercise. Pre-diabetes or diabetes which comes on with stress is helped by exercising.  Eat wholesome,  balanced meals. Eat often and at regular, fixed times. Your caregiver or nutritionist will give you a meal plan to guide your sugar intake.  Being at an ideal weight is important. If needed, losing as little as 10 to 15 pounds may help improve blood  glucose levels. SEEK MEDICAL CARE IF:   You have questions about medicine, activity, or diet.  You continue to have symptoms (problems such as increased thirst, urination, or weight gain). SEEK IMMEDIATE MEDICAL CARE IF:   You are vomiting or have diarrhea.  Your breath smells fruity.  You are breathing faster or slower.  You are very sleepy or incoherent.  You have numbness, tingling, or pain in your feet or hands.  You have chest pain.  Your symptoms get worse even though you have been following your caregiver's orders.  If you have any other questions or concerns.   This information is not intended to replace advice given to you by your health care provider. Make sure you discuss any questions you have with your health care provider.   Document Released: 02/14/2001 Document Revised: 11/13/2011 Document Reviewed: 04/27/2015 Elsevier Interactive Patient Education 2016 Elsevier Inc[employer] Weakness Weakness is a lack of strength. It may be felt all over the body (generalized) or in one specific part of the body (focal). Some causes of weakness can be serious. You may need further medical evaluation, especially if you are elderly or you have a history of immunosuppression (such as chemotherapy or HIV), kidney disease, heart disease, or diabetes. CAUSES  Weakness can be caused by many different things, including:  Infection.  Physical exhaustion.  Internal bleeding or other blood loss that results in a lack of red blood cells (anemia).  Dehydration. This cause is more common in elderly people.  Side effects or electrolyte abnormalities from medicines, such as pain medicines or sedatives.  Emotional distress, anxiety, or depression.  Circulation problems, especially severe peripheral arterial disease.  Heart disease, such as rapid atrial fibrillation, bradycardia, or heart failure.  Nervous system disorders, such as Guillain-Barr syndrome, multiple sclerosis, or  stroke. DIAGNOSIS  To find the cause of your weakness, your caregiver will take your history and perform a physical exam. Lab tests or X-rays may also be ordered, if needed. TREATMENT  Treatment of weakness depends on the cause of your symptoms and can vary greatly. HOME CARE INSTRUCTIONS   Rest as needed.  Eat a well-balanced diet.  Try to get some exercise every day.  Only take over-the-counter or prescription medicines as directed by your caregiver. SEEK MEDICAL CARE IF:   Your weakness seems to be getting worse or spreads to other parts of your body.  You develop new aches or pains. SEEK IMMEDIATE MEDICAL CARE IF:   You cannot perform your normal daily activities, such as getting dressed and feeding yourself.  You cannot walk up and down stairs, or you feel exhausted when you do so.  You have shortness of breath or chest pain.  You have difficulty moving parts of your body.  You have weakness in only one area of the body or on only one side of the body.  You have a fever.  You have trouble speaking or swallowing.  You cannot control your bladder or bowel movements.  You have black or bloody vomit or stools. MAKE SURE YOU:  Understand these instructions.  Will watch your condition.  Will get help right away if you are not doing well or get worse.   This information is not  intended to replace advice given to you by your health care provider. Make sure you discuss any questions you have with your health care provider.   Document Released: 08/21/2005 Document Revised: 02/20/2012 Document Reviewed: 10/20/2011 Elsevier Interactive Patient Education [employer].

## 2016-01-13 NOTE — ED Notes (Signed)
Pt has a liter infusing via IV started by EMS

## 2016-01-13 NOTE — ED Notes (Signed)
Pt reports blood sugar is in the 600's, pt reports being tired and weak

## 2016-01-17 ENCOUNTER — Other Ambulatory Visit: Payer: Self-pay | Admitting: Family Medicine

## 2016-01-17 MED ORDER — INSULIN NPH (HUMAN) (ISOPHANE) 100 UNIT/ML ~~LOC~~ SUSP: SUBCUTANEOUS | Status: DC

## 2016-01-17 MED ORDER — INSULIN LISPRO 100 UNIT/ML ~~LOC~~ SOLN: SUBCUTANEOUS | Status: DC

## 2016-01-17 NOTE — Telephone Encounter (Signed)
I will take patient at her word; now after hours; will fax Rx to pharmacy

## 2016-01-17 NOTE — Telephone Encounter (Signed)
Patient has tried to contact her Endocrinologist and has had no success all she keeps getting is the answering machine. Please call: [facility] ([address] [place])    (P) [phone]

## 2016-01-17 NOTE — Telephone Encounter (Signed)
I need units please The insulin NPH (long-acting Humulin N) does not specify how many units or how often (AM only or BID?) I also need the sliding scale for the insulin lispro (short-acting Humalog) and verify if AC (with meals)

## 2016-01-17 NOTE — Telephone Encounter (Signed)
Requesting refill on Humolog and Humulan. Patient has appointment for 01-25-16 and will be out by then. Please send to [facility]

## 2016-01-17 NOTE — Telephone Encounter (Signed)
Humulin she takes 75 units in am, 50 units at lunch and 75 with supper. Humalog is sliding scale goes up by 5 units every 50 and AC 70-150=30units 151-200=35units 201-250=40units 251-300=45units 301-350=50units 351-400=55units 401-450=60units

## 2016-01-18 DIAGNOSIS — J449 Chronic obstructive pulmonary disease, unspecified: Secondary | ICD-10-CM | POA: Diagnosis not present

## 2016-01-18 DIAGNOSIS — R0902 Hypoxemia: Secondary | ICD-10-CM | POA: Diagnosis not present

## 2016-01-18 DIAGNOSIS — G4733 Obstructive sleep apnea (adult) (pediatric): Secondary | ICD-10-CM | POA: Diagnosis not present

## 2016-01-18 NOTE — Telephone Encounter (Signed)
Called left voicemail with medical records to send [place] notes on why patient was on certain insulin dosages

## 2016-01-21 ENCOUNTER — Other Ambulatory Visit: Payer: Self-pay

## 2016-01-21 MED ORDER — AMLODIPINE BESYLATE 2.5 MG PO TABS: 2.5000 mg | ORAL_TABLET | Freq: Every day | ORAL | Status: DC

## 2016-01-25 ENCOUNTER — Ambulatory Visit: Payer: Self-pay | Admitting: Family Medicine

## 2016-01-25 ENCOUNTER — Other Ambulatory Visit: Payer: Self-pay

## 2016-01-25 MED ORDER — "INSULIN SYRINGE-NEEDLE U-100 31G X 1/4"" 0.3 ML MISC": 1.0000 | Freq: Every day | Status: DC | PRN

## 2016-01-29 IMAGING — CT CT HEAD WITHOUT CONTRAST
1 of 2 series · 16 of 30 positions shown, 20 images · non-contrast
Comparison: 05/27/2014

CLINICAL DATA: Patient acutely unresponsive to verbal stimuli and
only partially responsive to physical stimuli.

EXAM:
CT HEAD WITHOUT CONTRAST
TECHNIQUE: Contiguous axial images were obtained from the base of the skull
through the vertex without intravenous contrast.

[Series 2: head wo · axial · 0.39mm/px · z∈[+473,+599]mm · 16 of 32 slices shown, 20 images]
[im 2/32  brain]
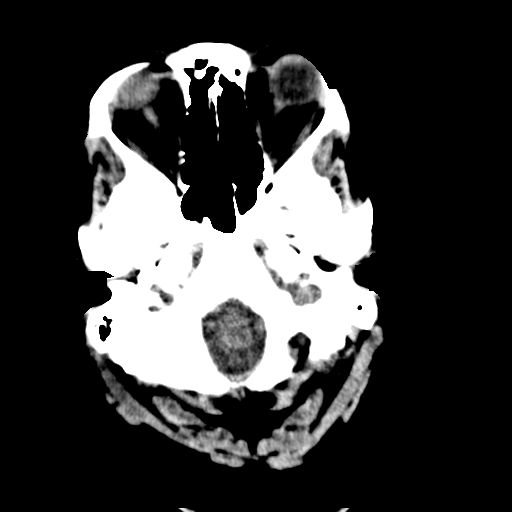
[im 2/32  bone]
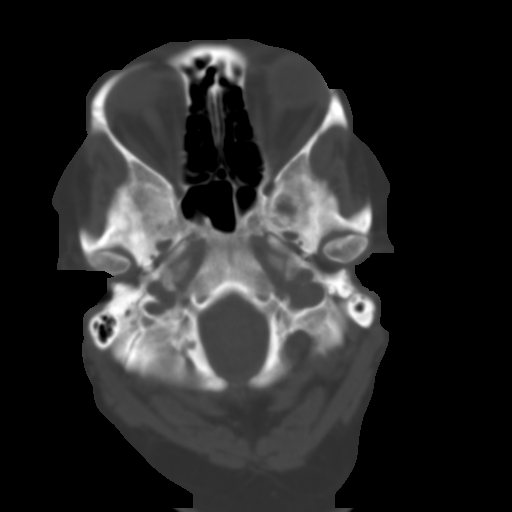
[im 3/32  brain]
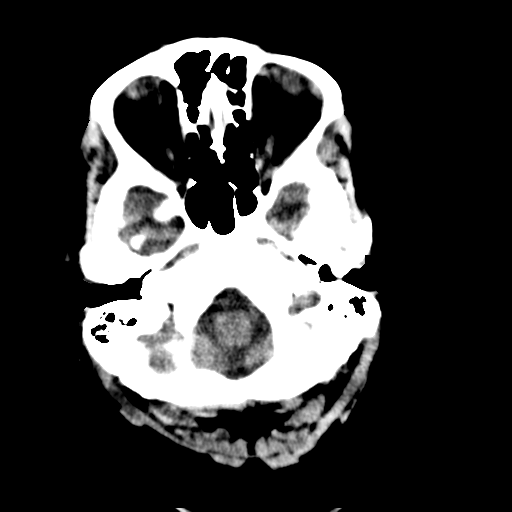
[im 6/32  brain]
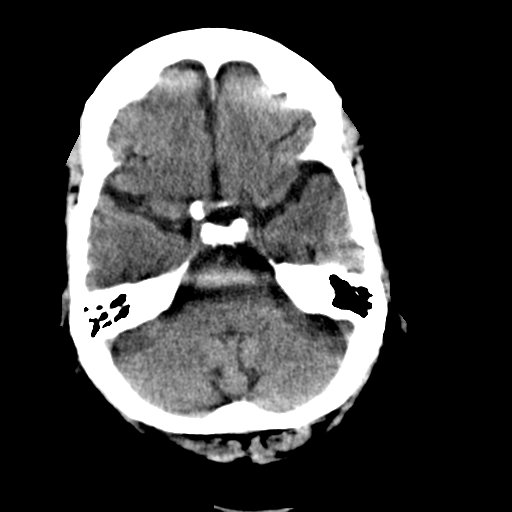
[im 8/32  brain]
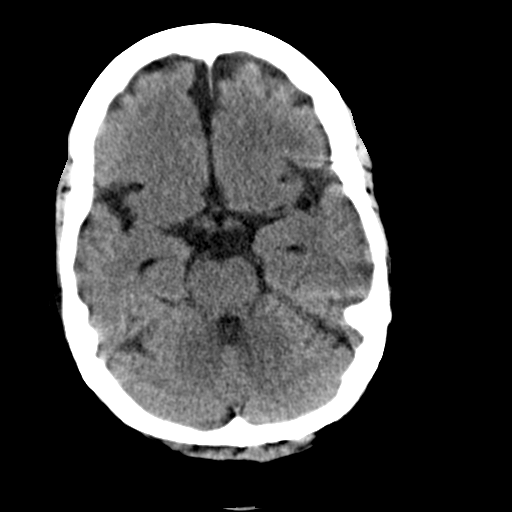
[im 9/32  brain]
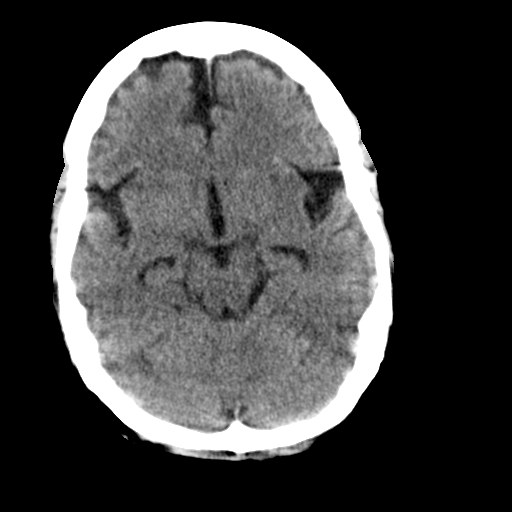
[im 9/32  bone]
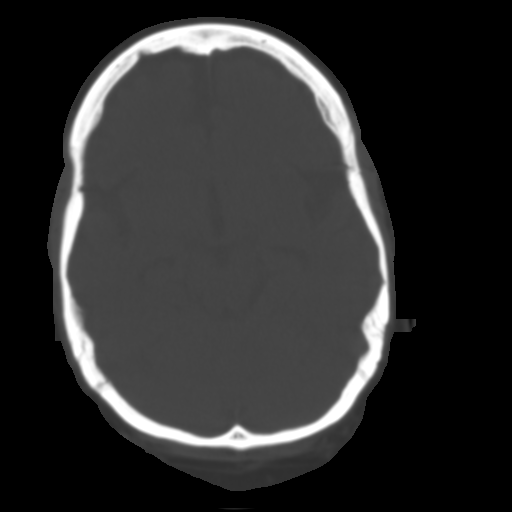
[im 11/32  brain]
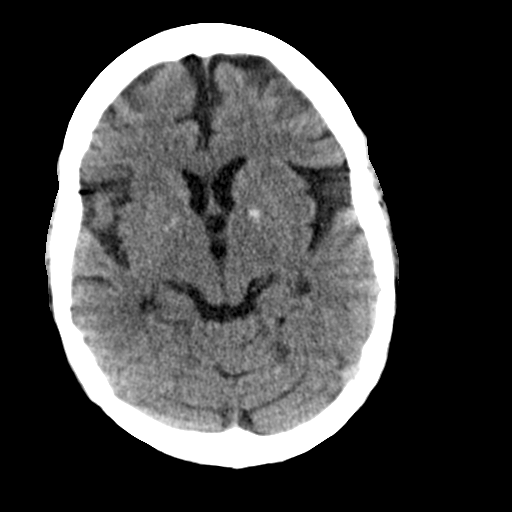
[im 14/32  brain]
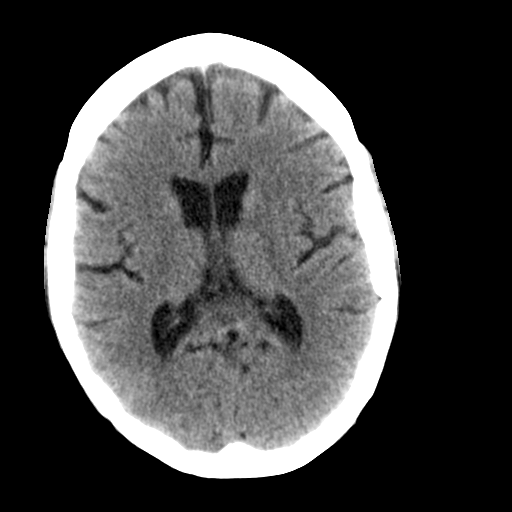
[im 15/32  brain]
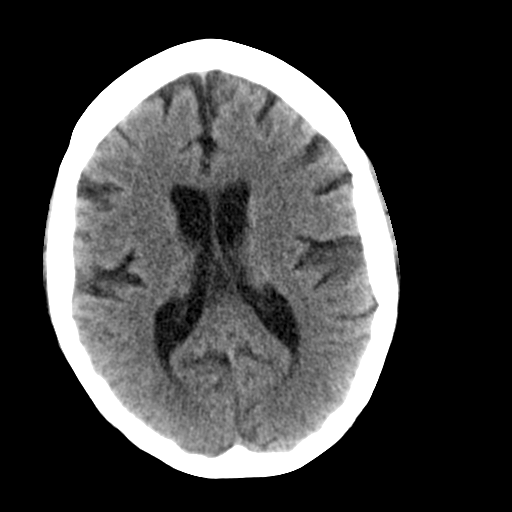
[im 17/32  brain]
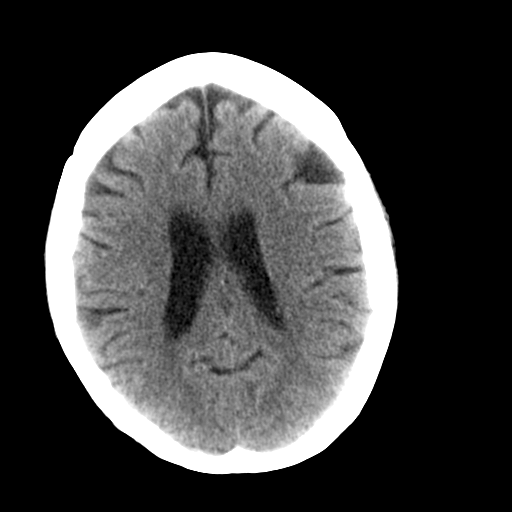
[im 17/32  bone]
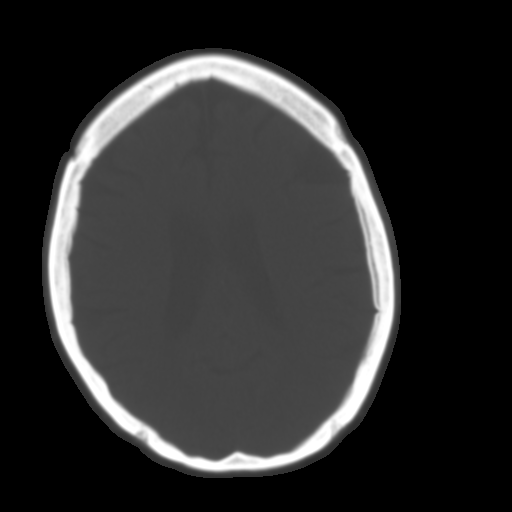
[im 18/32  brain]
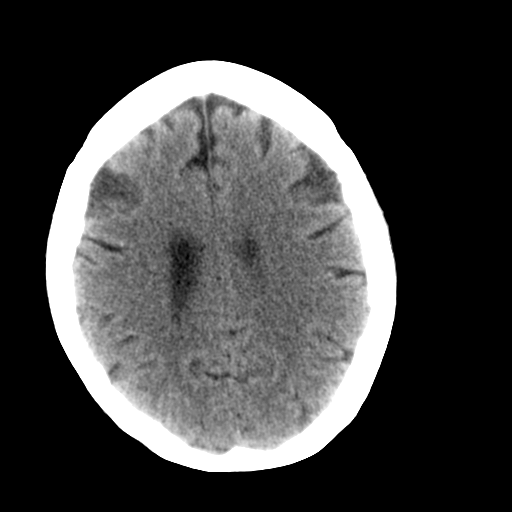
[im 21/32  brain]
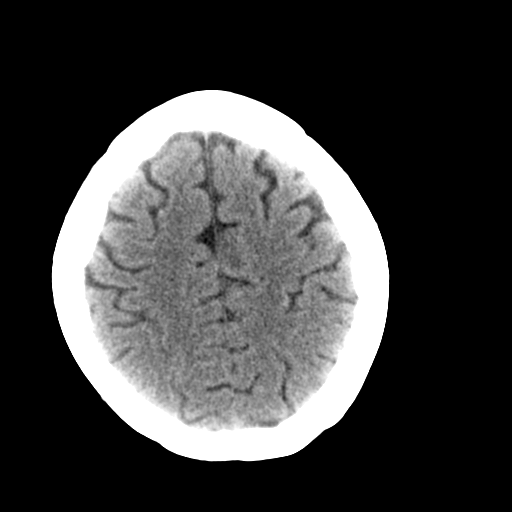
[im 23/32  brain]
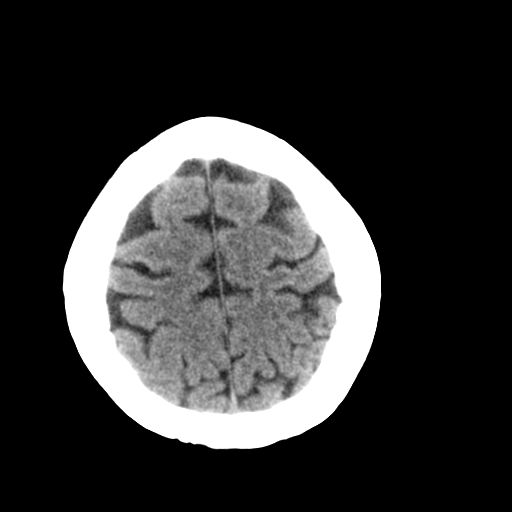
[im 24/32  brain]
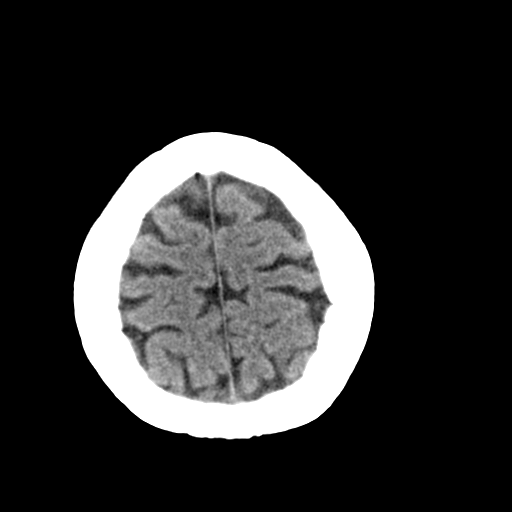
[im 24/32  bone]
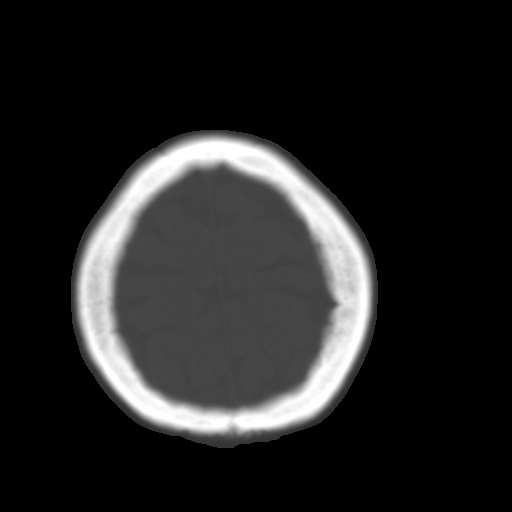
[im 26/32  brain]
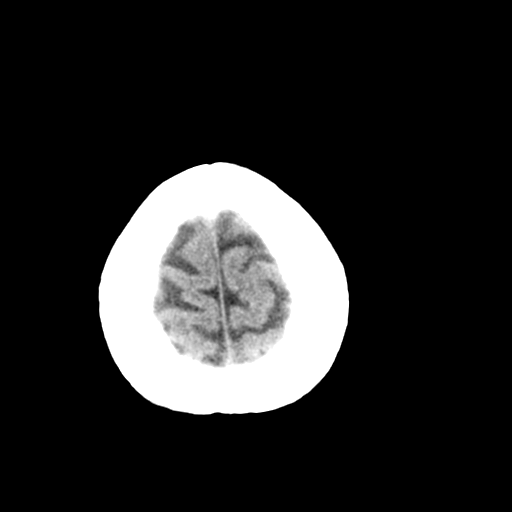
[im 29/32  brain]
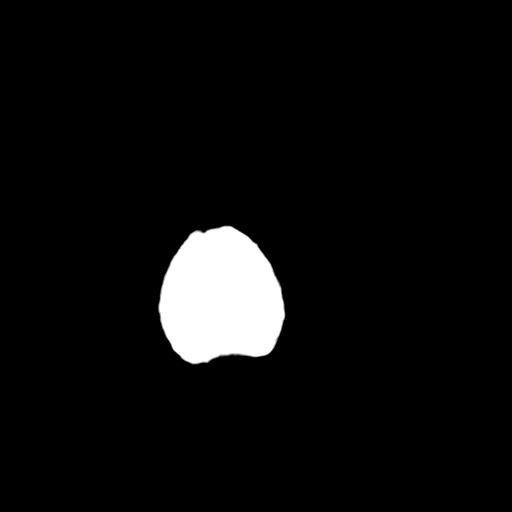
[im 30/32  brain]
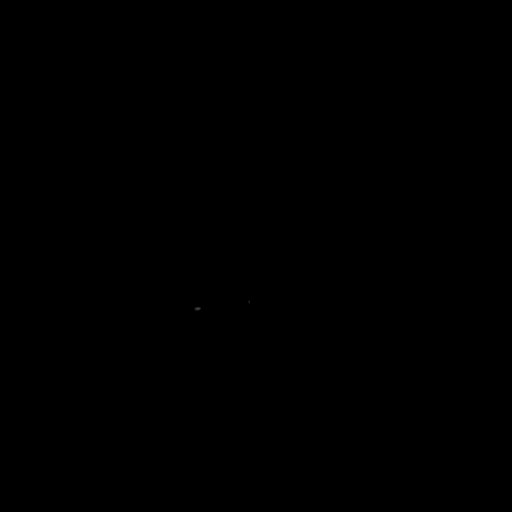

[16 of 30 positions shown; findings below may reference images not displayed]

FINDINGS: There is mild calcification the globus pallidus nuclei. Otherwise,
The brainstem, cerebellum, cerebral peduncles, thalamus, basal
ganglia, basilar cisterns, and ventricular system appear within
normal limits. No intracranial hemorrhage, mass lesion, or acute
CVA.

Mild chronic left sphenoid, right posterior ethmoid, and left
frontal sinusitis.
IMPRESSION: 1. No acute intracranial findings.
2. Mild chronic paranasal sinusitis.

## 2016-01-29 IMAGING — CR DG CHEST 1V PORT
1 series · 1 of 1 positions shown · non-contrast
Comparison: 05/27/2014

CLINICAL DATA: Unresponsive, hyperglycemic, history of COPD

EXAM:
PORTABLE CHEST - 1 VIEW

[ap]
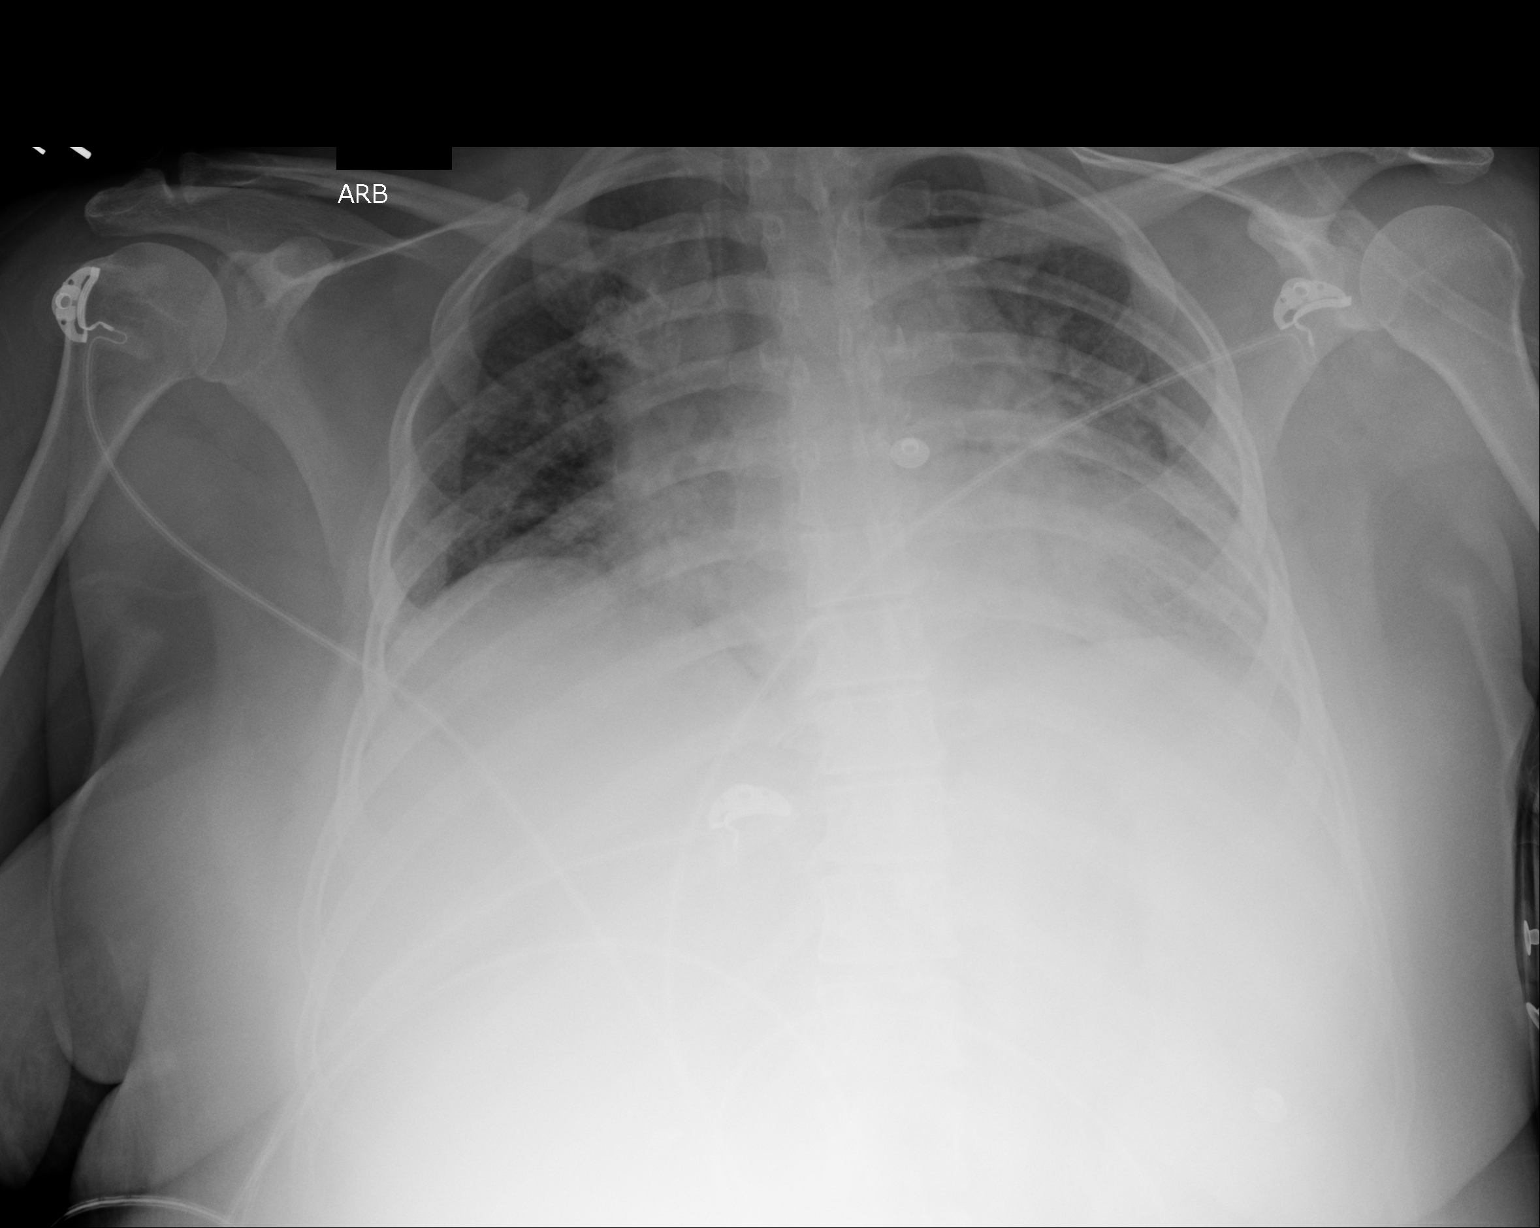

[1 of 1 positions shown; findings below may reference images not displayed]

FINDINGS: Low lung volumes with vascular crowding. No pleural effusion or
pneumothorax.

The heart is enlarged.
IMPRESSION: Low lung volumes with vascular crowding.

## 2016-01-30 ENCOUNTER — Encounter: Payer: Self-pay | Admitting: Family Medicine

## 2016-01-30 NOTE — Assessment & Plan Note (Signed)
Hx of low Mg2+ now with cramping and drawing of toes; poorly controlled diabetic, on meds too which could deplete Mg2+ and impair absorption from GI tract

## 2016-01-31 IMAGING — CR DG CHEST 2V
1 series · 2 of 2 positions shown · non-contrast
Comparison: 06/27/2014.

CLINICAL DATA: Hepatic encephalopathy, COPD, respiratory distress.
Possible pneumonia.

EXAM:
CHEST  2 VIEW

[Series 2: x chest ap · 0.14mm/px · 2 of 2 slices shown]
[im 1/2]
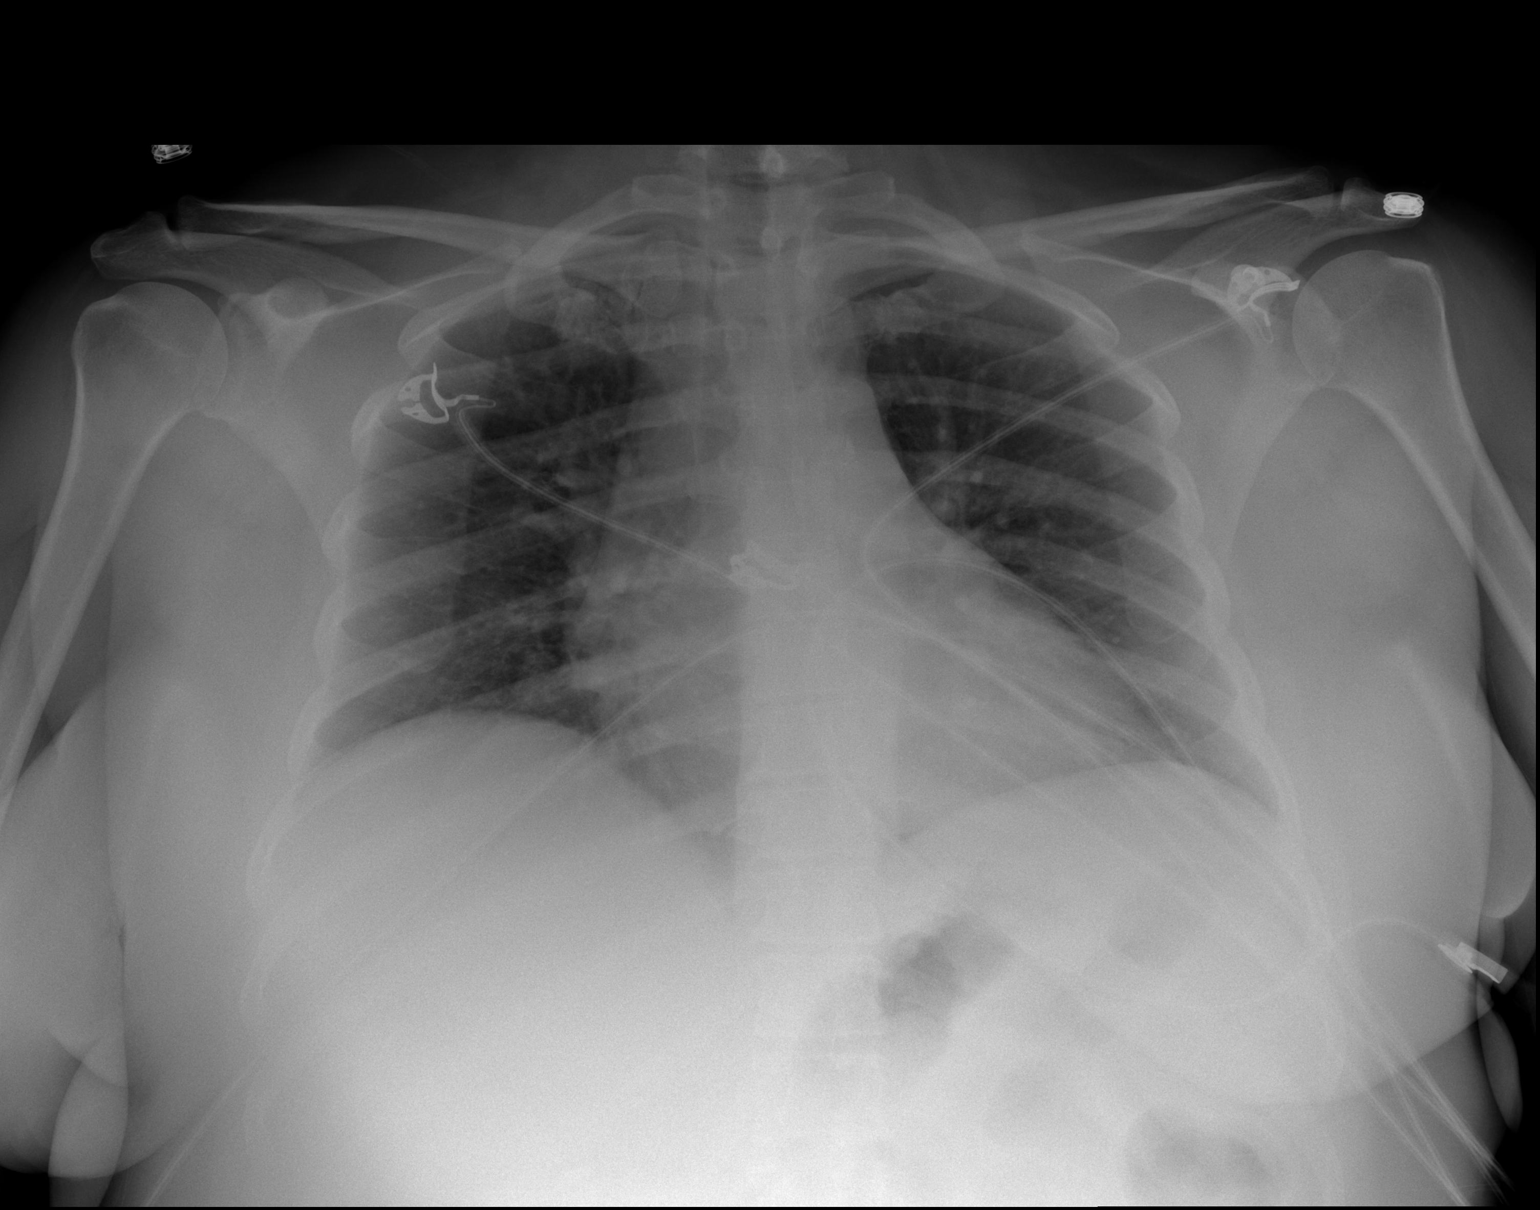
[im 2/2]
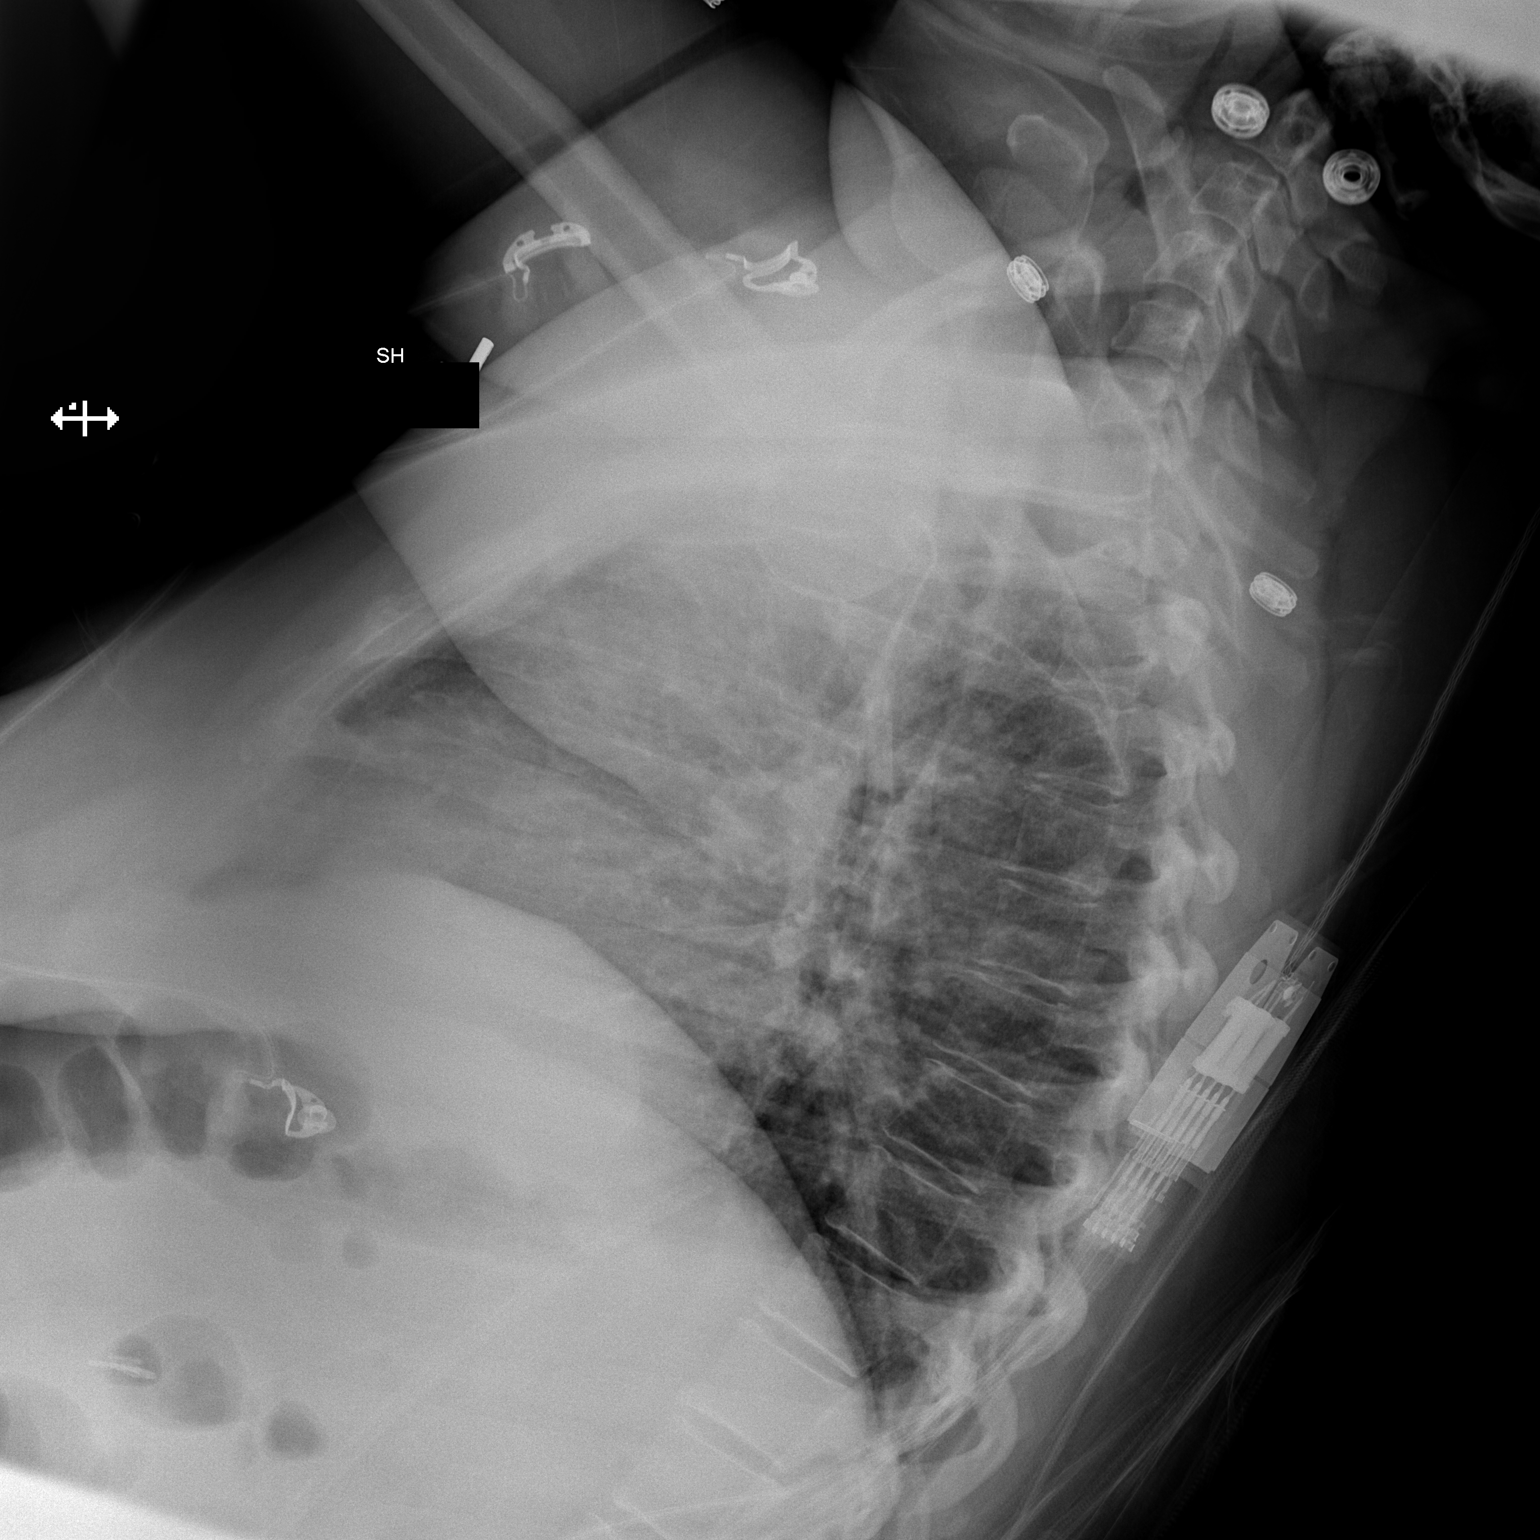

[2 of 2 positions shown; findings below may reference images not displayed]

FINDINGS: Improved cardiac silhouette, and improved low lung volumes. Clearing
of vascular congestion without focal consolidation. No effusion or
pneumothorax. Negative osseous structures.
IMPRESSION: Improved aeration.  No definite active process.

## 2016-02-03 ENCOUNTER — Ambulatory Visit: Payer: Self-pay | Admitting: Family Medicine

## 2016-02-14 ENCOUNTER — Ambulatory Visit: Payer: Self-pay | Admitting: Family Medicine

## 2016-02-18 ENCOUNTER — Emergency Department
Admission: EM | Admit: 2016-02-18 | Discharge: 2016-02-18 | Disposition: A | Discharge status: Home / Self Care | Payer: Medicare Other | Attending: Emergency Medicine | Admitting: Emergency Medicine

## 2016-02-18 DIAGNOSIS — F319 Bipolar disorder, unspecified: Secondary | ICD-10-CM | POA: Diagnosis not present

## 2016-02-18 DIAGNOSIS — Z794 Long term (current) use of insulin: Secondary | ICD-10-CM | POA: Insufficient documentation

## 2016-02-18 DIAGNOSIS — E785 Hyperlipidemia, unspecified: Secondary | ICD-10-CM | POA: Insufficient documentation

## 2016-02-18 DIAGNOSIS — R0902 Hypoxemia: Secondary | ICD-10-CM | POA: Diagnosis not present

## 2016-02-18 DIAGNOSIS — J449 Chronic obstructive pulmonary disease, unspecified: Secondary | ICD-10-CM | POA: Insufficient documentation

## 2016-02-18 DIAGNOSIS — E1122 Type 2 diabetes mellitus with diabetic chronic kidney disease: Secondary | ICD-10-CM | POA: Diagnosis not present

## 2016-02-18 DIAGNOSIS — E039 Hypothyroidism, unspecified: Secondary | ICD-10-CM | POA: Insufficient documentation

## 2016-02-18 DIAGNOSIS — G4733 Obstructive sleep apnea (adult) (pediatric): Secondary | ICD-10-CM | POA: Diagnosis not present

## 2016-02-18 DIAGNOSIS — I130 Hypertensive heart and chronic kidney disease with heart failure and stage 1 through stage 4 chronic kidney disease, or unspecified chronic kidney disease: Secondary | ICD-10-CM | POA: Insufficient documentation

## 2016-02-18 DIAGNOSIS — Z79899 Other long term (current) drug therapy: Secondary | ICD-10-CM | POA: Insufficient documentation

## 2016-02-18 DIAGNOSIS — J4540 Moderate persistent asthma, uncomplicated: Secondary | ICD-10-CM | POA: Diagnosis not present

## 2016-02-18 DIAGNOSIS — N183 Chronic kidney disease, stage 3 (moderate): Secondary | ICD-10-CM | POA: Insufficient documentation

## 2016-02-18 DIAGNOSIS — R110 Nausea: Secondary | ICD-10-CM | POA: Diagnosis not present

## 2016-02-18 DIAGNOSIS — Z791 Long term (current) use of non-steroidal anti-inflammatories (NSAID): Secondary | ICD-10-CM | POA: Diagnosis not present

## 2016-02-18 DIAGNOSIS — Z7951 Long term (current) use of inhaled steroids: Secondary | ICD-10-CM | POA: Diagnosis not present

## 2016-02-18 DIAGNOSIS — I509 Heart failure, unspecified: Secondary | ICD-10-CM | POA: Diagnosis not present

## 2016-02-18 DIAGNOSIS — E1140 Type 2 diabetes mellitus with diabetic neuropathy, unspecified: Secondary | ICD-10-CM | POA: Insufficient documentation

## 2016-02-18 DIAGNOSIS — R109 Unspecified abdominal pain: Secondary | ICD-10-CM | POA: Diagnosis not present

## 2016-02-18 DIAGNOSIS — R197 Diarrhea, unspecified: Secondary | ICD-10-CM | POA: Diagnosis not present

## 2016-02-18 LAB — CBC
HCT: 37.4 % (ref 35.0–47.0)
HEMOGLOBIN: 12.6 g/dL (ref 12.0–16.0)
MCH: 29.1 pg (ref 26.0–34.0)
MCHC: 33.7 g/dL (ref 32.0–36.0)
MCV: 86.3 fL (ref 80.0–100.0)
Platelets: 261 10*3/uL (ref 150–440)
RBC: 4.34 MIL/uL (ref 3.80–5.20)
RDW: 14.1 % (ref 11.5–14.5)
WBC: 7 10*3/uL (ref 3.6–11.0)

## 2016-02-18 LAB — URINALYSIS COMPLETE WITH MICROSCOPIC (ARMC ONLY)
BILIRUBIN URINE: NEGATIVE
Glucose, UA: NEGATIVE mg/dL
Hgb urine dipstick: NEGATIVE
KETONES UR: NEGATIVE mg/dL
NITRITE: NEGATIVE
PROTEIN: NEGATIVE mg/dL
SPECIFIC GRAVITY, URINE: 1.019 (ref 1.005–1.030)
pH: 6 (ref 5.0–8.0)

## 2016-02-18 LAB — COMPREHENSIVE METABOLIC PANEL
ALK PHOS: 121 U/L (ref 38–126)
ALT: 45 U/L (ref 14–54)
ANION GAP: 10 (ref 5–15)
AST: 55 U/L — ABNORMAL HIGH (ref 15–41)
Albumin: 4.1 g/dL (ref 3.5–5.0)
BILIRUBIN TOTAL: 0.2 mg/dL — AB (ref 0.3–1.2)
BUN: 32 mg/dL — ABNORMAL HIGH (ref 6–20)
CALCIUM: 9.8 mg/dL (ref 8.9–10.3)
CO2: 24 mmol/L (ref 22–32)
Chloride: 104 mmol/L (ref 101–111)
Creatinine, Ser: 0.98 mg/dL (ref 0.44–1.00)
Glucose, Bld: 168 mg/dL — ABNORMAL HIGH (ref 65–99)
Potassium: 4.1 mmol/L (ref 3.5–5.1)
SODIUM: 138 mmol/L (ref 135–145)
TOTAL PROTEIN: 6.5 g/dL (ref 6.5–8.1)

## 2016-02-18 LAB — LIPASE, BLOOD: Lipase: 36 U/L (ref 11–51)

## 2016-02-18 MED ORDER — KETOROLAC TROMETHAMINE 30 MG/ML IJ SOLN
15.0000 mg | Freq: Once | INTRAMUSCULAR | Status: AC
  Administered 2016-02-18: 15 mg via INTRAVENOUS
  Filled 2016-02-18: qty 1

## 2016-02-18 MED ORDER — SODIUM CHLORIDE 0.9 % IV BOLUS (SEPSIS)
1000.0000 mL | Freq: Once | INTRAVENOUS | Status: AC
  Administered 2016-02-18: 1000 mL via INTRAVENOUS

## 2016-02-18 MED ORDER — LOPERAMIDE HCL 2 MG PO CAPS
2.0000 mg | ORAL_CAPSULE | Freq: Once | ORAL | Status: AC
  Administered 2016-02-18: 2 mg via ORAL
  Filled 2016-02-18: qty 1

## 2016-02-18 MED ORDER — LOPERAMIDE HCL 2 MG PO TABS: 2.0000 mg | ORAL_TABLET | Freq: Four times a day (QID) | ORAL | Status: DC | PRN

## 2016-02-18 MED ORDER — ONDANSETRON HCL 4 MG/2ML IJ SOLN
4.0000 mg | Freq: Once | INTRAMUSCULAR | Status: AC
  Administered 2016-02-18: 4 mg via INTRAVENOUS
  Filled 2016-02-18: qty 2

## 2016-02-18 NOTE — Discharge Instructions (Signed)

## 2016-02-18 NOTE — ED Provider Notes (Signed)
[facility] Emergency Department Provider Note  Time seen: 5:26 PM  I have reviewed the triage vital signs and the nursing notes.   HISTORY  Chief Complaint Abdominal Pain; Back Pain; and Diarrhea    HPI Stacey DEFINO is a 50 y.o. female with a past medical history of hypertension, MI, COPD, asthma, obesity, gastric reflux, bipolar, CHF, diabetes, who presents to the emergency department with abdominal discomfort. According to the patient for the past 3 days she has been having abdominal cramping and diarrhea. Denies any black or bloody stool. States she feels nauseated but denies any vomiting. She does state mild dysuria over the past several days as well. Describes her abdominal cramping is mild located in lower abdomen relieved after bowel movement. Patient states she was concerned she could be getting dehydrated so she came to the emergency department for evaluation.     Past Medical History  Diagnosis Date  . High blood pressure   . Heart attack ([place])   . Irregular heart rhythm   . Seasonal allergies   . Chronic headaches   . Sleep apnea   . Hyperlipidemia   . COPD (chronic obstructive pulmonary disease) ([place])   . Asthma   . Hypothyroid   . Morbid obesity ([place])   . OSA (obstructive sleep apnea)   . RLS (restless legs syndrome)   . GERD (gastroesophageal reflux disease)   . Bipolar depression ([place])   . Vitamin D deficiency   . Urinary incontinence   . Hypoxemia   . Liver lesion     Favored to be benign on CT; MRI of abd w/contract in Aug 2016. Liver Biopsy-Negative, March 2016  . Acute renal failure ([place])   . Hypomagnesemia   . Hypertension   . Headache   . Diabetes mellitus without complication (HCC)     Type 2, insulin dependent. Patient also takes Metformin.  . CHF (congestive heart failure) ([place])   . Diabetic neuropathy ([place])   . Pinched nerve     Back    Patient Active Problem List   Diagnosis Date Noted  . Adenomatous colon  polyp 10/15/2015  . Airway hyperreactivity 10/15/2015  . Bipolar affective disorder ([place]) 10/15/2015  . CN (constipation) 10/15/2015  . CAFL (chronic airflow limitation) ([place]) 10/15/2015  . Drug-induced hepatic toxicity 10/15/2015  . Blood in feces 10/15/2015  . HLD (hyperlipidemia) 10/15/2015  . Hyperthyroidism 10/15/2015  . Hypoxemia 10/15/2015  . NASH (nonalcoholic steatohepatitis) 10/15/2015  . Adiposity 10/15/2015  . Restless leg syndrome 10/15/2015  . Type 2 diabetes mellitus ([place]) 10/15/2015  . Severe obstructive sleep apnea 10/15/2015  . Elevated alkaline phosphatase level 09/22/2015  . Urinary bladder incontinence 09/22/2015  . Diarrhea 08/18/2015  . Low back pain with left-sided sciatica 08/18/2015  . Encounter for monitoring tricyclic antidepressant therapy 05/13/2015  . Bipolar disorder ([place]) 05/11/2015  . Liver disease 05/11/2015  . Diabetes ([place]) 05/11/2015  . Apnea, sleep 05/05/2015  . Heart valve disease 05/05/2015  . Chronic kidney disease, stage III (moderate) 03/15/2015  . Anemia 03/15/2015  . Migraine 03/12/2015  . COPD (chronic obstructive pulmonary disease) ([place]) 01/20/2015  . 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) 01/20/2015  . GERD (gastroesophageal reflux disease) 01/20/2015  . Disorder of smooth muscle 01/20/2015  . Hepatic encephalopathy ([place]) 01/20/2015  . Hypothyroid   . Vitamin D deficiency   . Urinary incontinence   . Liver lesion   . Hypomagnesemia   . Obstructive sleep apnea 10/20/2013  . PERIPHERAL NEUROPATHY 03/04/2009  .  HEMORRHOIDS, EXTERNAL 09/03/2008  . ALLERGIC RHINITIS 07/04/2007  . ASTHMA, PERSISTENT, MODERATE 07/04/2007  . INTERSTITIAL CYSTITIS 07/04/2007  . HYPERTRIGLYCERIDEMIA 07/03/2007  . FATIGUE, CHRONIC 07/03/2007  . HYPERTENSION, BENIGN ESSENTIAL 06/19/2007    Past Surgical History  Procedure Laterality Date  . Abdominal hysterectomy    . Tubal ligation    . Liver biopsy  March 2016    Negative  . Abdominal  hysterectomy      Partial  . Cholecystectomy    . Bladder surgery      x 2  . Lung biopsy      Current Outpatient Rx  Name  Route  Sig  Dispense  Refill  . ACCU-CHEK AVIVA PLUS test strip                 Dispense as written.   Marland Kitchen ACCU-CHEK SOFTCLIX LANCETS lancets               . albuterol (PROVENTIL HFA;VENTOLIN HFA) 108 (90 Base) MCG/ACT inhaler   Inhalation   Inhale 2 puffs into the lungs every 4 (four) hours as needed for wheezing or shortness of breath.   1 Inhaler   1   . amLODipine (NORVASC) 2.5 MG tablet   Oral   Take 1 tablet (2.5 mg total) by mouth daily.   30 tablet   5   . Choline Fenofibrate (FENOFIBRIC ACID) 45 MG CPDR   Oral   Take 1 capsule by mouth daily.   30 capsule   5   . cyclobenzaprine (FLEXERIL) 10 MG tablet   Oral   Take 1 tablet (10 mg total) by mouth every 8 (eight) hours as needed for muscle spasms.   60 tablet   2   . DULoxetine (CYMBALTA) 60 MG capsule   Oral   Take 120 mg by mouth daily.         . fluticasone (FLONASE) 50 MCG/ACT nasal spray   Each Nare   Place 2 sprays into both nostrils daily.   16 g   2   . insulin lispro (HUMALOG) 100 UNIT/ML injection      Per endo: Sliding scale goes up by 5 units every 50; with meals 70-150=30units 151-200=35units 201-250=40units 251-300=45units 301-350=50units 351-400=55units 401-450=60units   60 mL   0   . insulin NPH Human (HUMULIN N,NOVOLIN N) 100 UNIT/ML injection      Per endo: 75 units in am, 50 units at lunch and 75 with supper   60 mL   0     [phone]   . Insulin Syringe-Needle U-100 (ULTICARE INSULIN SYRINGE) 31G X 1/4" 0.3 ML MISC   Does not apply   1 each by Does not apply route 5 (five) times daily as needed.   200 each   0   . lamoTRIgine (LAMICTAL) 150 MG tablet   Oral   Take 150 mg by mouth daily.          Marland Kitchen levothyroxine (SYNTHROID, LEVOTHROID) 75 MCG tablet      TAKE 1 TABLET BY MOUTH EVERY DAY.   30 tablet   11   . LINZESS 145 MCG  CAPS capsule   Oral   Take 145 mcg by mouth daily.            Dispense as written.   . magnesium oxide (MAG-OX) 400 MG tablet   Oral   Take 1 tablet (400 mg total) by mouth 3 (three) times daily.   90 tablet   2   .  meloxicam (MOBIC) 15 MG tablet   Oral   Take 15 mg by mouth daily.         . metoprolol tartrate (LOPRESSOR) 25 MG tablet   Oral   Take 0.5 tablets (12.5 mg total) by mouth 2 (two) times daily.   30 tablet   5   . Multiple Vitamin (MULTIVITAMIN) capsule   Oral   Take 1 capsule by mouth daily.          . ondansetron (ZOFRAN) 4 MG tablet   Oral   Take 1 tablet (4 mg total) by mouth daily as needed for nausea or vomiting.   20 tablet   1   . oxyCODONE-acetaminophen (ROXICET) 5-325 MG tablet   Oral   Take 1 tablet by mouth every 6 (six) hours as needed.   10 tablet   0   . promethazine-dextromethorphan (PROMETHAZINE-DM) 6.25-15 MG/5ML syrup   Oral   Take 5 mLs by mouth 4 (four) times daily as needed for cough.   118 mL   0   . QUEtiapine (SEROQUEL) 25 MG tablet   Oral   Take 25 mg by mouth every morning. Take one in am, one at lunch         . QUEtiapine (SEROQUEL) 400 MG tablet   Oral   Take 800 mg by mouth at bedtime. Take 2 tabs at bedtime         . ranitidine (ZANTAC) 300 MG tablet   Oral   Take 1 tablet (300 mg total) by mouth at bedtime.   30 tablet   5   . REXULTI 1 MG TABS   Oral   Take 1 mg by mouth daily.            Dispense as written.   . topiramate (TOPAMAX) 25 MG tablet   Oral   Take 1 tablet (25 mg total) by mouth 2 (two) times daily.   180 tablet   1     Patient needs to schedule appt with Dr.Sethi to co ...   . zolpidem (AMBIEN) 10 MG tablet   Oral   Take 10 mg by mouth at bedtime.            Allergies Morphine and related; Codeine; Codeine; Hydrocodone-acetaminophen; Mirtazapine; Morphine and related; Sulfamethoxazole-trimethoprim; and Vicodin  Family History  Problem Relation Age of Onset  .  Heart disease Maternal Grandfather   . Rheum arthritis Maternal Grandfather   . Cancer Maternal Grandfather     liver  . Cancer Paternal Grandmother     lung  . Diabetes Mother   . Heart disease Mother   . Hyperlipidemia Mother   . Hypertension Mother   . Kidney disease Mother   . Mental illness Mother   . Hypothyroidism Mother   . Stroke Mother   . Osteoporosis Mother   . Glaucoma Mother   . Congestive Heart Failure Mother   . Hypertension Father   . Asthma Daughter   . Cancer Daughter     throat    Social History Social History  Substance Use Topics  . Smoking status: Never Smoker   . Smokeless tobacco: Never Used  . Alcohol Use: No    Review of Systems Constitutional: Negative for fever. Cardiovascular: Negative for chest pain. Respiratory: Negative for shortness of breath. Gastrointestinal: Lower abdominal cramping. Positive for nausea. Negative for vomiting. Positive for diarrhea. Negative for black or bloody stool. Genitourinary: Mild dysuria. Musculoskeletal: Negative for back pain. Neurological: Negative for headache  10-point ROS otherwise negative.  ____________________________________________   PHYSICAL EXAM:  VITAL SIGNS: ED Triage Vitals  Enc Vitals Group     BP 02/18/16 1652 116/64 mmHg     Pulse Rate 02/18/16 1652 95     Resp 02/18/16 1647 18     Temp 02/18/16 1647 98.4 F (36.9 C)     Temp Source 02/18/16 1647 Oral     SpO2 02/18/16 1652 95 %     Weight 02/18/16 1647 190 lb (86.183 kg)     Height 02/18/16 1647 4' 11"  (1.499 m)     Head Cir --      Peak Flow --      Pain Score 02/18/16 1648 8     Pain Loc --      Pain Edu? --      Excl. in [place]? --     Constitutional: Alert and oriented. Well appearing and in no distress. Eyes: Normal exam ENT   Head: Normocephalic and atraumatic.   Mouth/Throat: Mucous membranes are moist. Cardiovascular: Normal rate, regular rhythm. No murmur Respiratory: Normal respiratory effort without  tachypnea nor retractions. Breath sounds are clear  Gastrointestinal: Soft, nontender. No rebound or guarding. Musculoskeletal: Nontender with normal range of motion in all extremities. Neurologic:  Normal speech and language. No gross focal neurologic deficits Skin:  Skin is warm, dry and intact.  Psychiatric: Mood and affect are normal. Speech and behavior are normal.   ____________________________________________    INITIAL IMPRESSION / ASSESSMENT AND PLAN / ED COURSE  Pertinent labs & imaging results that were available during my care of the patient were reviewed by me and considered in my medical decision making (see chart for details).  The patient presents the emergency department with lower abdominal cramping, diarrhea for the past 3 days. Overall the patient appears well, nontender abdominal exam. We will dose Zofran, IV fluids, obtain labs and closely monitor in the emergency department.  Patient's labs are within normal limits. Patient states she is feeling better after medications. We will discharge home with loperamide as needed every follow up with her primary care physician. Patient agreeable to plan.  ____________________________________________   FINAL CLINICAL IMPRESSION(S) / ED DIAGNOSES  Diarrhea   Harvest Dark, MD 02/18/16 [phone]

## 2016-02-18 NOTE — ED Notes (Signed)
Pt alert and oriented X4, active, cooperative, pt in NAD. RR even and unlabored, color WNL.  Pt informed to return if any life threatening symptoms occur.   

## 2016-02-18 NOTE — ED Notes (Signed)
Pt arrives to ER via [facility] from home c/o epigastric pain and diarrhea X 2 days. PT states she feels fatigued. Pt alert and oriented X4, active, cooperative, pt in NAD. RR even and unlabored, color WNL.  20G to R hand by [facility]  Pt alert and oriented X4, active, cooperative, pt in NAD. RR even and unlabored, color WNL.

## 2016-02-21 ENCOUNTER — Other Ambulatory Visit: Payer: Self-pay

## 2016-02-21 DIAGNOSIS — M5442 Lumbago with sciatica, left side: Secondary | ICD-10-CM

## 2016-02-22 MED ORDER — CYCLOBENZAPRINE HCL 10 MG PO TABS
10.0000 mg | ORAL_TABLET | Freq: Three times a day (TID) | ORAL | Status: DC | PRN
Start: 2016-02-22 — End: 2016-03-14

## 2016-02-22 NOTE — Telephone Encounter (Signed)
Pt.notified

## 2016-02-22 NOTE — Telephone Encounter (Signed)
Refill request again for cyclobenzaprine received; I sent limited Rx, but please let patient know that we'll want her to start physical therapy for her back; thanks

## 2016-02-22 NOTE — Assessment & Plan Note (Signed)
Refer to PT

## 2016-02-23 ENCOUNTER — Ambulatory Visit: Payer: Self-pay | Admitting: Family Medicine

## 2016-03-02 ENCOUNTER — Ambulatory Visit: Payer: Self-pay | Admitting: Family Medicine

## 2016-03-10 ENCOUNTER — Telehealth: Payer: Self-pay | Admitting: Emergency Medicine

## 2016-03-10 NOTE — Telephone Encounter (Signed)
Refill cyclobenzaprine

## 2016-03-14 ENCOUNTER — Ambulatory Visit (INDEPENDENT_AMBULATORY_CARE_PROVIDER_SITE_OTHER): Payer: Medicare Other | Admitting: Family Medicine

## 2016-03-14 ENCOUNTER — Encounter: Payer: Self-pay | Admitting: Family Medicine

## 2016-03-14 VITALS — BP 100/64 | HR 100 | Temp 99.3°F | Resp 16 | Wt 185.0 lb

## 2016-03-14 DIAGNOSIS — N390 Urinary tract infection, site not specified: Secondary | ICD-10-CM | POA: Insufficient documentation

## 2016-03-14 DIAGNOSIS — N3000 Acute cystitis without hematuria: Secondary | ICD-10-CM

## 2016-03-14 DIAGNOSIS — R309 Painful micturition, unspecified: Secondary | ICD-10-CM | POA: Diagnosis not present

## 2016-03-14 LAB — POCT URINALYSIS DIPSTICK
Bilirubin, UA: NEGATIVE
Blood, UA: NEGATIVE
Glucose, UA: NEGATIVE
Ketones, UA: NEGATIVE
Nitrite, UA: NEGATIVE
PH UA: 5.5
PROTEIN UA: NEGATIVE
SPEC GRAV UA: 1.015
UROBILINOGEN UA: 0.2

## 2016-03-14 MED ORDER — NYSTATIN 100000 UNIT/GM EX POWD: Freq: Three times a day (TID) | CUTANEOUS | Status: DC

## 2016-03-14 MED ORDER — DOXYCYCLINE HYCLATE 100 MG PO TABS
100.0000 mg | ORAL_TABLET | Freq: Two times a day (BID) | ORAL | Status: DC
Start: 2016-03-14 — End: 2016-03-21

## 2016-03-14 MED ORDER — PHENAZOPYRIDINE HCL 100 MG PO TABS: 100.0000 mg | ORAL_TABLET | Freq: Three times a day (TID) | ORAL | Status: AC | PRN

## 2016-03-14 MED ORDER — CYCLOBENZAPRINE HCL 10 MG PO TABS: 10.0000 mg | ORAL_TABLET | Freq: Three times a day (TID) | ORAL | Status: DC | PRN

## 2016-03-14 NOTE — Patient Instructions (Addendum)
Start the antibiotics We'll culture the urine Go to the ER if pain severe or fever or vomiting Return in August for diabetes Hold the amlodipine for the next several days until you are feeling better Try drinking green tea to help your immune system during your illness Get plenty of rest and hydration Check fingerstick blood sugars 3x a day and bring those with you to your appointment

## 2016-03-14 NOTE — Progress Notes (Signed)
BP 100/64   Pulse 100   Temp 99.3 F (37.4 C) (Oral)   Resp 16   Wt 185 lb (83.9 kg)   SpO2 97%   BMI 37.37 kg/m    Subjective:    Patient ID: [name], female    DOB: [birth year], 50 y.o.   MRN: [id number]  HPI: [name] is a 50 y.o. female  Chief Complaint  Patient presents with  . Diabetes  . Urinary Tract Infection    onset 3-4 day frequency and painful urination   Patient has had 3-4 days of urinary symptoms; dysuria, frequency; strong odor; lower abd pain and lower back pain; feels nauseated; low grade temp Allergic to sulfonamides Has leaking from her bladder when she gets UTI; has box in the back (stimulator); sees urologist  She has type 2 diabetes; sugar was 485 this morning; 120-something this afternoon; she forgets her shots; not seeing endocrinologist; checking sugars 3x a day; dry mouth, blurred vision, nocturia Lab Results  Component Value Date   HGBA1C 10.8 01/06/2016   She was in the ER on June 16th with diarrhea and dehydration  Has yeast infection; under the right breast, left groin  She needs refills of muscle relaxers; has muscle tightness in arms and hands and cortisone shots and legs and back and feet; the muscle relaxer helps without making her feel wonky; keeping her from having to get cortisone shots  Depression screen [facility] 2/9 03/14/2016 01/06/2016  Decreased Interest 0 0  Down, Depressed, Hopeless 0 1  PHQ - 2 Score 0 1   Relevant past medical, surgical, family and social history reviewed Past Medical History:  Diagnosis Date  . Acute renal failure ([place])   . Asthma   . Bipolar depression ([place])   . CHF (congestive heart failure) ([place])   . Chronic headaches   . COPD (chronic obstructive pulmonary disease) ([place])   . Diabetes mellitus without complication (HCC)    Type 2, insulin dependent. Patient also takes Metformin.  . Diabetic neuropathy ([place])   . GERD (gastroesophageal reflux disease)   . Headache   . Heart attack ([place])     . High blood pressure   . Hyperlipidemia   . Hypertension   . Hypomagnesemia   . Hypothyroid   . Irregular heart rhythm   . Liver lesion    Favored to be benign on CT; MRI of abd w/contract in Aug 2016. Liver Biopsy-Negative, March 2016  . Morbid obesity ([place])   . OSA (obstructive sleep apnea)   . Pinched nerve    Back  . RLS (restless legs syndrome)   . Seasonal allergies   . Sleep apnea   . Type 2 diabetes, uncontrolled, with neuropathy ([place]) 10/15/2015   Overview:  Microalbumin 7.7 12/2010: Hgb A1c 10.2% 12/2010 and 9.7% 03/2011, eye exam 10/2009 [facility]   . Urinary incontinence   . Vitamin D deficiency    Past Surgical History:  Procedure Laterality Date  . ABDOMINAL HYSTERECTOMY    . ABDOMINAL HYSTERECTOMY     Partial  . BLADDER SURGERY     x 2  . CHOLECYSTECTOMY    . LIVER BIOPSY  March 2016   Negative  . LUNG BIOPSY    . TUBAL LIGATION     Family History  Problem Relation Age of Onset  . Diabetes Mother   . Heart disease Mother   . Hyperlipidemia Mother   . Hypertension Mother   . Kidney disease Mother   .  Mental illness Mother   . Hypothyroidism Mother   . Stroke Mother   . Osteoporosis Mother   . Glaucoma Mother   . Congestive Heart Failure Mother   . Hypertension Father   . Asthma Daughter   . Cancer Daughter     throat  . Heart disease Maternal Grandfather   . Rheum arthritis Maternal Grandfather   . Cancer Maternal Grandfather     liver  . Cancer Paternal Grandmother     lung   Social History  Substance Use Topics  . Smoking status: Never Smoker  . Smokeless tobacco: Never Used  . Alcohol use No    Interim medical history since last visit reviewed. Allergies and medications reviewed  Review of Systems Per HPI unless specifically indicated above     Objective:    BP 100/64   Pulse 100   Temp 99.3 F (37.4 C) (Oral)   Resp 16   Wt 185 lb (83.9 kg)   SpO2 97%   BMI 37.37 kg/m   Wt Readings from Last 3 Encounters:  05/04/16  186 lb (84.4 kg)  05/01/16 186 lb (84.4 kg)  04/18/16 189 lb (85.7 kg)    Physical Exam  Constitutional: She appears well-developed and well-nourished. No distress.  Eyes: EOM are normal. No scleral icterus.  Neck: No thyromegaly present.  Cardiovascular: Normal rate.   Pulmonary/Chest: Effort normal.  Abdominal: She exhibits no distension. There is tenderness in the suprapubic area.  Skin: Rash (erythematous rash under breast, distinct margin, satellites) noted. No pallor.  Psychiatric: She has a normal mood and affect. Her behavior is normal. Judgment and thought content normal.   Diabetic Foot Form - Detailed   Diabetic Foot Exam - detailed Diabetic Foot exam was performed with the following findings:  Yes 03/14/2016  8:50 AM  Visual Foot Exam completed.:  Yes  Normal Range of Motion:  Yes Pulse Foot Exam completed.:  Yes  Right Dorsalis Pedis:  Present Left Dorsalis Pedis:  Present  Sensory Foot Exam Completed.:  Yes Swelling:  No Semmes-Weinstein Monofilament Test R Site 1-Great Toe:  Pos L Site 1-Great Toe:  Pos  R Site 4:  Pos L Site 4:  Pos  R Site 5:  Pos L Site 5:  Pos          Assessment & Plan:   Problem List Items Addressed This Visit      Genitourinary   Urinary tract infection   Relevant Orders   Urine Culture (Completed)    Other Visit Diagnoses    Painful urination    -  Primary   Relevant Orders   POCT urinalysis dipstick (Completed)   Urine Culture (Completed)      Follow up plan: Return in about 4 weeks (around 04/10/2016), or or just AFTER, for fasting labs and diabetes.  An after-visit summary was printed and given to the patient at [place].  Please see the patient instructions which may contain other information and recommendations beyond what is mentioned above in the assessment and plan.  Meds ordered this encounter  Medications  . DISCONTD: doxycycline (VIBRA-TABS) 100 MG tablet    Sig: Take 1 tablet (100 mg total) by mouth 2 (two) times  daily.    Dispense:  14 tablet    Refill:  0  . phenazopyridine (PYRIDIUM) 100 MG tablet    Sig: Take 1 tablet (100 mg total) by mouth 3 (three) times daily as needed for pain.    Dispense:  10 tablet  Refill:  0  . cyclobenzaprine (FLEXERIL) 10 MG tablet    Sig: Take 1 tablet (10 mg total) by mouth every 8 (eight) hours as needed for muscle spasms.    Dispense:  60 tablet    Refill:  1  . DISCONTD: nystatin (MYCOSTATIN/NYSTOP) powder    Sig: Apply topically 3 (three) times daily. As needed to affected area    Dispense:  30 g    Refill:  0    Orders Placed This Encounter  Procedures  . Urine Culture  . POCT urinalysis dipstick

## 2016-03-16 LAB — URINE CULTURE

## 2016-03-19 DIAGNOSIS — G4733 Obstructive sleep apnea (adult) (pediatric): Secondary | ICD-10-CM | POA: Diagnosis not present

## 2016-03-19 DIAGNOSIS — R0902 Hypoxemia: Secondary | ICD-10-CM | POA: Diagnosis not present

## 2016-03-19 DIAGNOSIS — J449 Chronic obstructive pulmonary disease, unspecified: Secondary | ICD-10-CM | POA: Diagnosis not present

## 2016-03-20 ENCOUNTER — Telehealth: Payer: Self-pay | Admitting: Family Medicine

## 2016-03-20 NOTE — Telephone Encounter (Signed)
Was diagnosed with UTI and was told if the medication did not work to give her a call. States she has taken the last pill and it did help some but the UTI is still present. Please send something to [facility].

## 2016-03-21 ENCOUNTER — Other Ambulatory Visit: Payer: Self-pay

## 2016-03-21 MED ORDER — NITROFURANTOIN MONOHYD MACRO 100 MG PO CAPS: 100.0000 mg | ORAL_CAPSULE | Freq: Two times a day (BID) | ORAL | Status: DC

## 2016-03-21 NOTE — Telephone Encounter (Signed)
Rx sent 

## 2016-03-21 NOTE — Telephone Encounter (Signed)
Patient verbally informed.

## 2016-03-22 MED ORDER — LEVOTHYROXINE SODIUM 75 MCG PO TABS: 75.0000 ug | ORAL_TABLET | Freq: Every day | ORAL | Status: DC

## 2016-03-22 NOTE — Telephone Encounter (Signed)
Last TSH normal; Rx approved

## 2016-03-27 ENCOUNTER — Emergency Department: Payer: Medicare Other

## 2016-03-27 ENCOUNTER — Emergency Department
Admission: EM | Admit: 2016-03-27 | Discharge: 2016-03-27 | Disposition: A | Discharge status: Home / Self Care | Payer: Medicare Other | Attending: Emergency Medicine | Admitting: Emergency Medicine

## 2016-03-27 ENCOUNTER — Encounter: Payer: Self-pay | Admitting: Emergency Medicine

## 2016-03-27 DIAGNOSIS — Z79899 Other long term (current) drug therapy: Secondary | ICD-10-CM | POA: Diagnosis not present

## 2016-03-27 DIAGNOSIS — E039 Hypothyroidism, unspecified: Secondary | ICD-10-CM | POA: Diagnosis not present

## 2016-03-27 DIAGNOSIS — I509 Heart failure, unspecified: Secondary | ICD-10-CM | POA: Insufficient documentation

## 2016-03-27 DIAGNOSIS — R1032 Left lower quadrant pain: Secondary | ICD-10-CM | POA: Diagnosis not present

## 2016-03-27 DIAGNOSIS — Z791 Long term (current) use of non-steroidal anti-inflammatories (NSAID): Secondary | ICD-10-CM | POA: Insufficient documentation

## 2016-03-27 DIAGNOSIS — Z794 Long term (current) use of insulin: Secondary | ICD-10-CM | POA: Insufficient documentation

## 2016-03-27 DIAGNOSIS — J449 Chronic obstructive pulmonary disease, unspecified: Secondary | ICD-10-CM | POA: Insufficient documentation

## 2016-03-27 DIAGNOSIS — J4540 Moderate persistent asthma, uncomplicated: Secondary | ICD-10-CM | POA: Diagnosis not present

## 2016-03-27 DIAGNOSIS — I130 Hypertensive heart and chronic kidney disease with heart failure and stage 1 through stage 4 chronic kidney disease, or unspecified chronic kidney disease: Secondary | ICD-10-CM | POA: Insufficient documentation

## 2016-03-27 DIAGNOSIS — N183 Chronic kidney disease, stage 3 (moderate): Secondary | ICD-10-CM | POA: Insufficient documentation

## 2016-03-27 DIAGNOSIS — R109 Unspecified abdominal pain: Secondary | ICD-10-CM | POA: Diagnosis not present

## 2016-03-27 DIAGNOSIS — E1122 Type 2 diabetes mellitus with diabetic chronic kidney disease: Secondary | ICD-10-CM | POA: Diagnosis not present

## 2016-03-27 LAB — COMPREHENSIVE METABOLIC PANEL
ALK PHOS: 114 U/L (ref 38–126)
ALT: 40 U/L (ref 14–54)
AST: 43 U/L — AB (ref 15–41)
Albumin: 4.3 g/dL (ref 3.5–5.0)
Anion gap: 15 (ref 5–15)
BUN: 26 mg/dL — AB (ref 6–20)
CALCIUM: 10.5 mg/dL — AB (ref 8.9–10.3)
CHLORIDE: 99 mmol/L — AB (ref 101–111)
CO2: 20 mmol/L — AB (ref 22–32)
CREATININE: 1.08 mg/dL — AB (ref 0.44–1.00)
GFR calc non Af Amer: 58 mL/min — ABNORMAL LOW (ref >60)
GLUCOSE: 341 mg/dL — AB (ref 65–99)
Potassium: 4.1 mmol/L (ref 3.5–5.1)
SODIUM: 134 mmol/L — AB (ref 135–145)
Total Bilirubin: 0.5 mg/dL (ref 0.3–1.2)
Total Protein: 7.2 g/dL (ref 6.5–8.1)

## 2016-03-27 LAB — CBC WITH DIFFERENTIAL/PLATELET
BASOS ABS: 0.1 10*3/uL (ref 0–0.1)
Basophils Relative: 1 %
EOS ABS: 0.1 10*3/uL (ref 0–0.7)
Eosinophils Relative: 2 %
HCT: 39.2 % (ref 35.0–47.0)
HEMOGLOBIN: 13.1 g/dL (ref 12.0–16.0)
LYMPHS ABS: 2.8 10*3/uL (ref 1.0–3.6)
LYMPHS PCT: 44 %
MCH: 28.9 pg (ref 26.0–34.0)
MCHC: 33.4 g/dL (ref 32.0–36.0)
MCV: 86.4 fL (ref 80.0–100.0)
Monocytes Absolute: 0.5 10*3/uL (ref 0.2–0.9)
Monocytes Relative: 7 %
NEUTROS PCT: 46 %
Neutro Abs: 2.9 10*3/uL (ref 1.4–6.5)
PLATELETS: 231 10*3/uL (ref 150–440)
RBC: 4.53 MIL/uL (ref 3.80–5.20)
RDW: 13.8 % (ref 11.5–14.5)
WBC: 6.4 10*3/uL (ref 3.6–11.0)

## 2016-03-27 LAB — URINALYSIS COMPLETE WITH MICROSCOPIC (ARMC ONLY)
BACTERIA UA: NONE SEEN
Bilirubin Urine: NEGATIVE
Glucose, UA: >500 mg/dL — AB
HGB URINE DIPSTICK: NEGATIVE
Nitrite: NEGATIVE
PH: 6 (ref 5.0–8.0)
PROTEIN: NEGATIVE mg/dL
Specific Gravity, Urine: 1.021 (ref 1.005–1.030)

## 2016-03-27 LAB — LIPASE, BLOOD: Lipase: 26 U/L (ref 11–51)

## 2016-03-27 MED ORDER — ONDANSETRON HCL 4 MG/2ML IJ SOLN
4.0000 mg | Freq: Once | INTRAMUSCULAR | Status: AC
  Administered 2016-03-27: 4 mg via INTRAVENOUS
  Filled 2016-03-27: qty 2

## 2016-03-27 MED ORDER — IBUPROFEN 600 MG PO TABS: 600.0000 mg | ORAL_TABLET | Freq: Three times a day (TID) | ORAL | 0 refills | Status: DC | PRN

## 2016-03-27 MED ORDER — HYDROMORPHONE HCL 1 MG/ML IJ SOLN
1.0000 mg | Freq: Once | INTRAMUSCULAR | Status: AC
  Administered 2016-03-27: 1 mg via INTRAVENOUS
  Filled 2016-03-27: qty 1

## 2016-03-27 MED ORDER — SODIUM CHLORIDE 0.9 % IV BOLUS (SEPSIS)
1000.0000 mL | Freq: Once | INTRAVENOUS | Status: AC
  Administered 2016-03-27: 1000 mL via INTRAVENOUS
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
## 2016-03-27 NOTE — Discharge Instructions (Signed)
You were evaluated for left lower abdominal pain, and as we discussed your exam and evaluation are reassuring in the emergency department today.  Return to the emergency department for any worsening pain, black or bloody stools, dizziness or passing out, or any other symptoms concerning to you. Return for any fevers.

## 2016-03-27 NOTE — ED Notes (Signed)
Patient transported to Ultrasound 

## 2016-03-27 NOTE — ED Triage Notes (Signed)
Reports LLQ pain x 2 wks.  Reports diarrhea 30 mins after she eats.

## 2016-03-28 LAB — URINE CULTURE

## 2016-04-03 ENCOUNTER — Ambulatory Visit: Payer: Medicare Other | Attending: Family Medicine

## 2016-04-03 IMAGING — CR DG CHEST 2V
1 series · 2 of 2 positions shown · non-contrast
Comparison: June 29, 2014

CLINICAL DATA: Chest pain and cough

EXAM:
CHEST  2 VIEW

[Series 1: dxr chest pa (or ap) and lateral · 0.14mm/px · 2 of 2 slices shown]
[im 1/2]
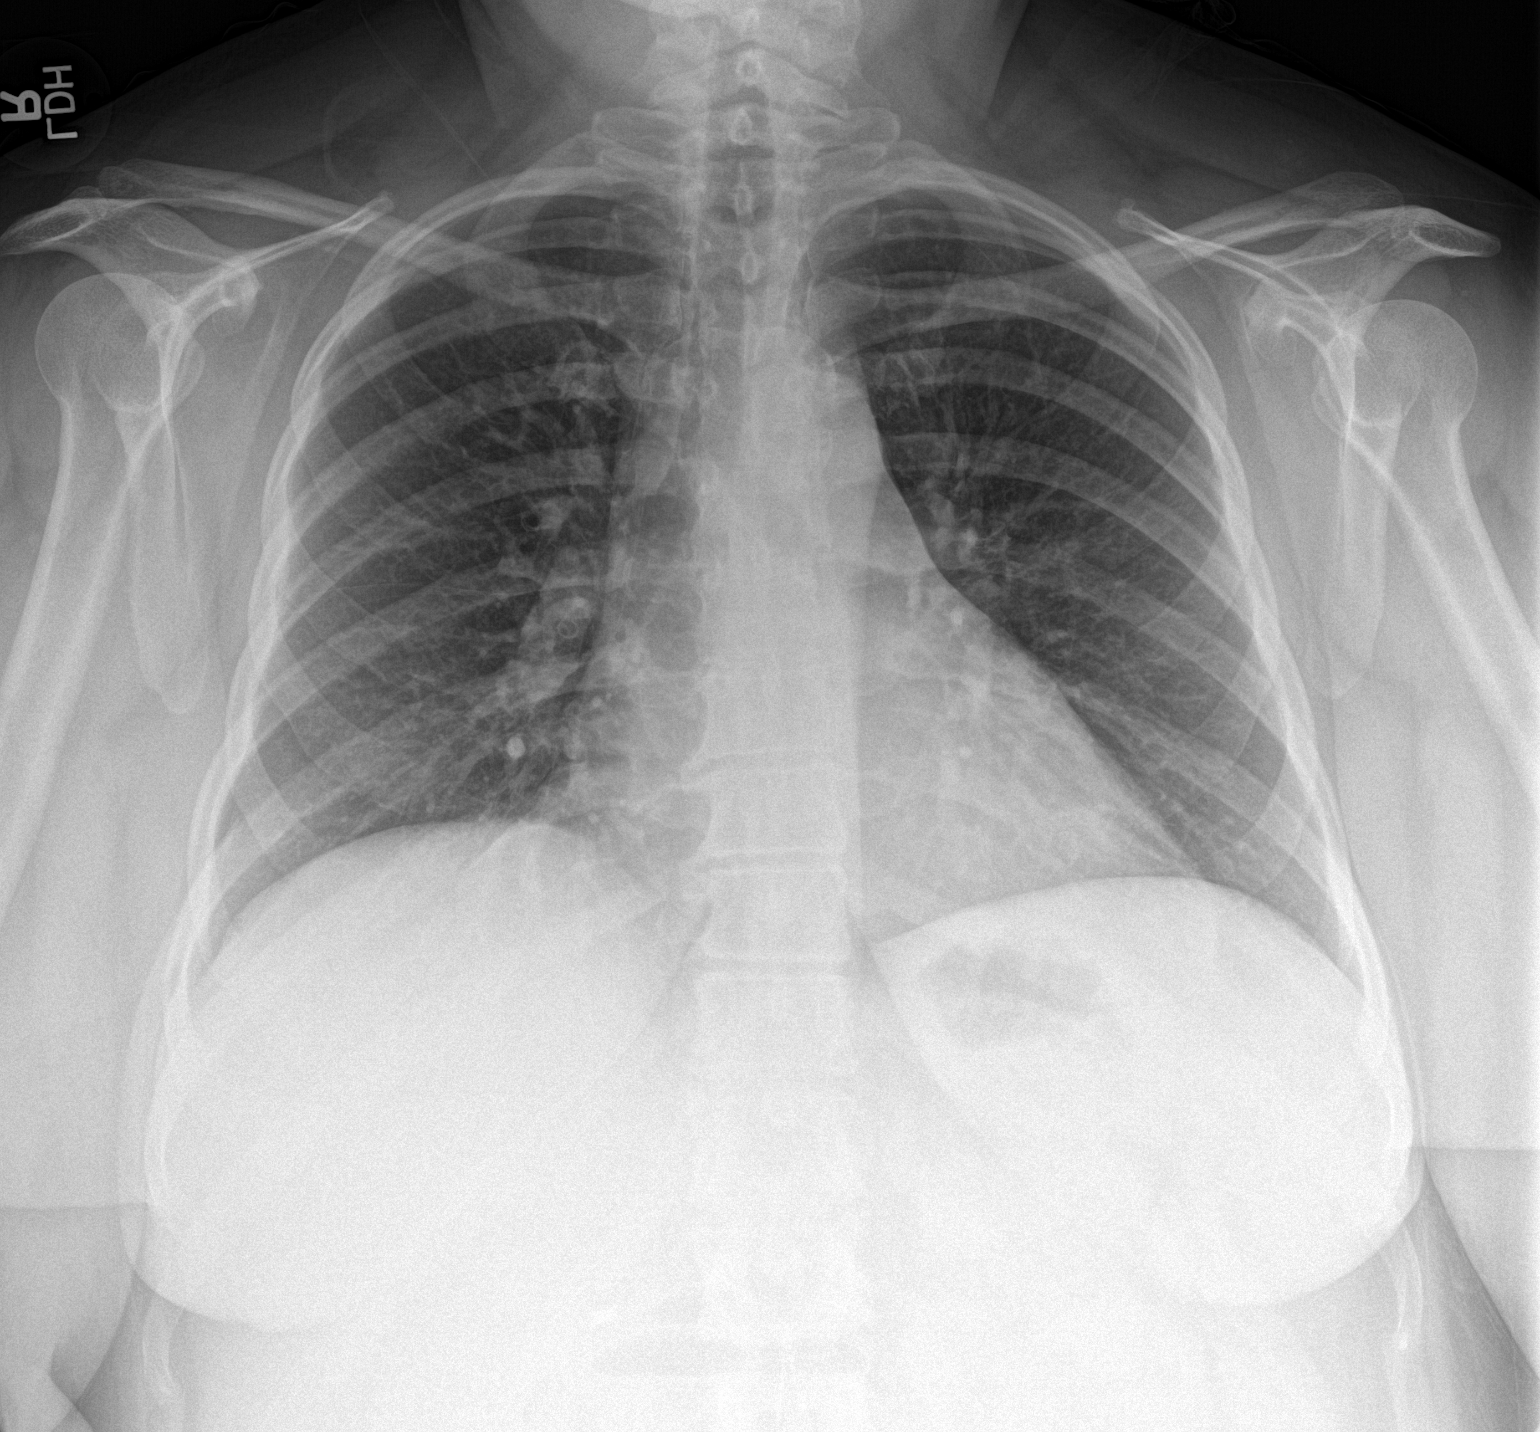
[im 2/2]
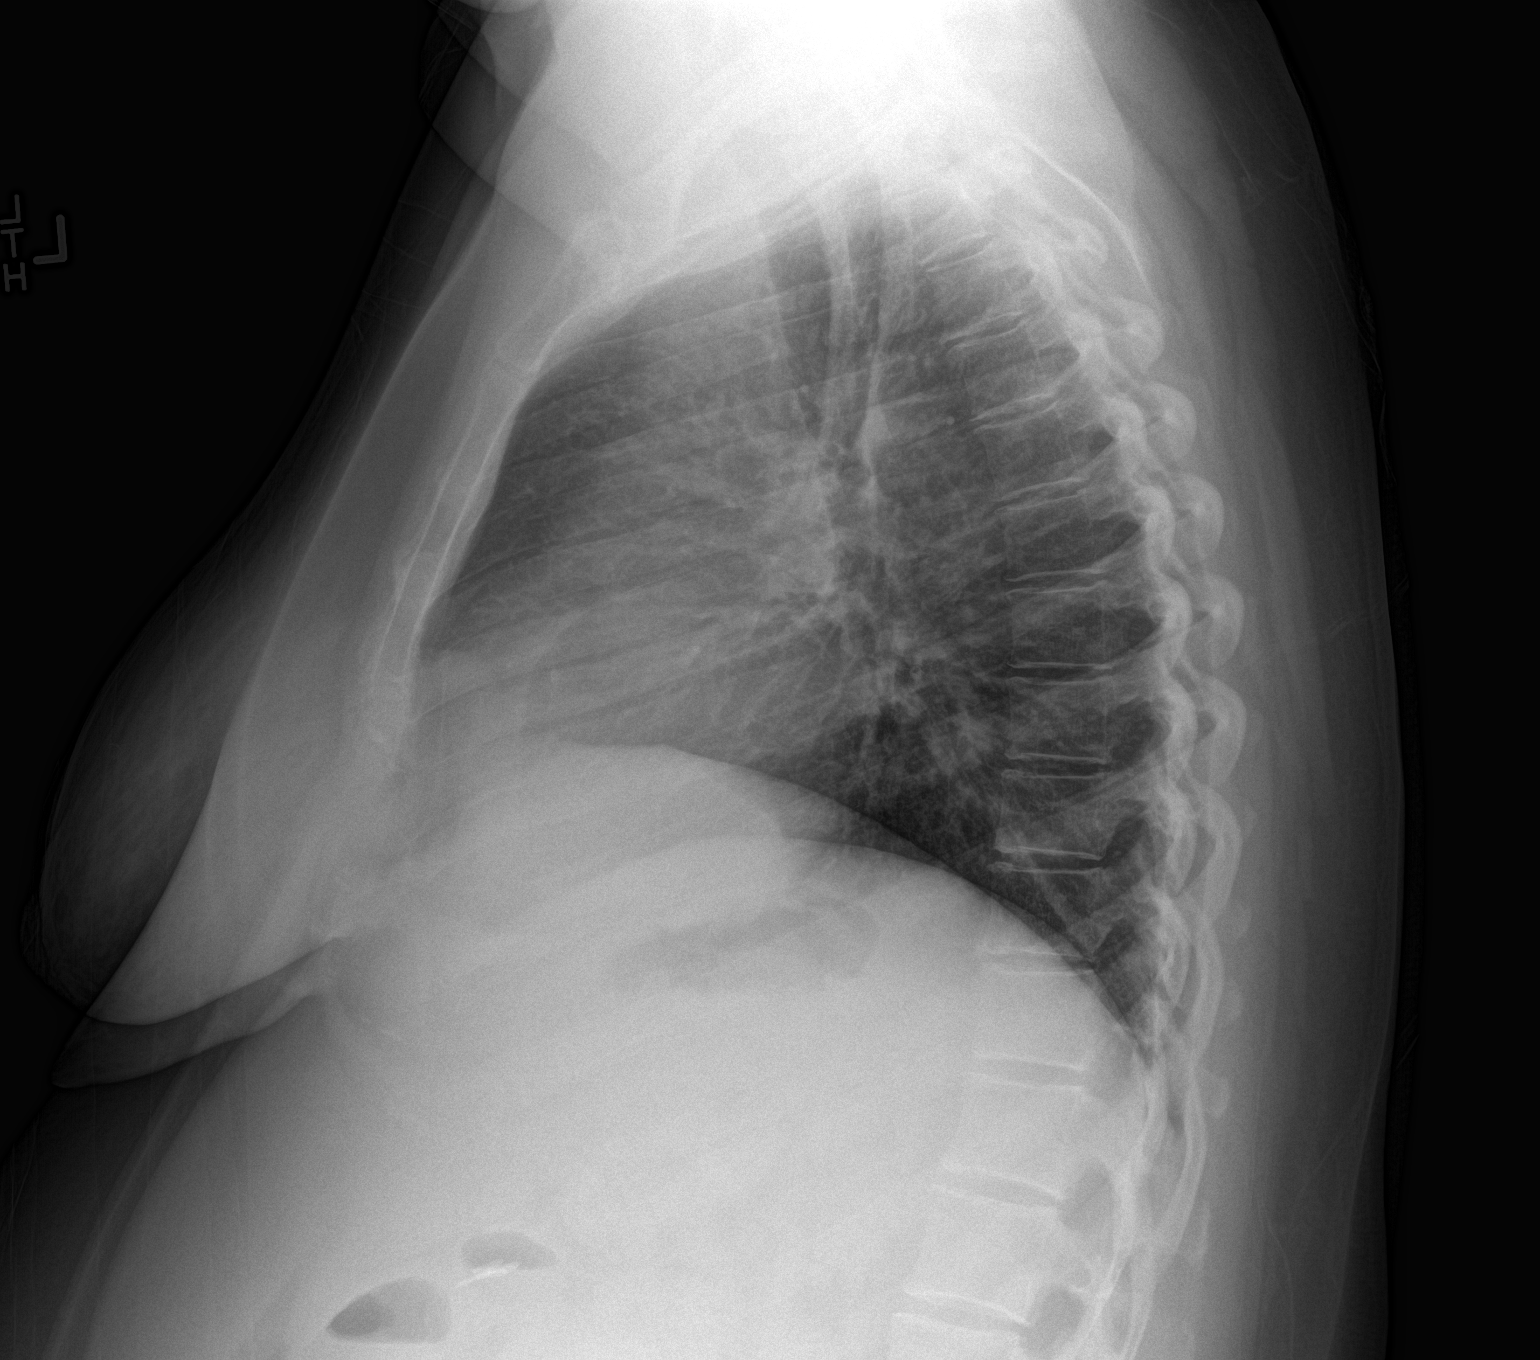

[2 of 2 positions shown; findings below may reference images not displayed]

FINDINGS: Lungs are clear. Heart size and pulmonary vascularity are normal. No
adenopathy. No pneumothorax. No bone lesions.
IMPRESSION: No abnormality noted.

## 2016-04-10 ENCOUNTER — Other Ambulatory Visit: Payer: Self-pay

## 2016-04-11 MED ORDER — INSULIN NPH (HUMAN) (ISOPHANE) 100 UNIT/ML ~~LOC~~ SUSP: SUBCUTANEOUS | 0 refills | Status: DC

## 2016-04-14 ENCOUNTER — Ambulatory Visit: Payer: Self-pay | Admitting: Family Medicine

## 2016-04-16 IMAGING — CT CT HEAD WITHOUT CONTRAST
1 series · 16 of 30 positions shown, 20 images · non-contrast
Comparison: CT of the head June 27, 2014

CLINICAL DATA: Altered mental status, somnolent, hypotensive. On
treatment for otitis media.

EXAM:
CT HEAD WITHOUT CONTRAST
TECHNIQUE: Contiguous axial images were obtained from the base of the skull
through the vertex without intravenous contrast.

[Series 2: head wo · axial · 0.44mm/px · z∈[-129,-3]mm · 16 of 32 slices shown, 20 images]
[im 2/32  brain]
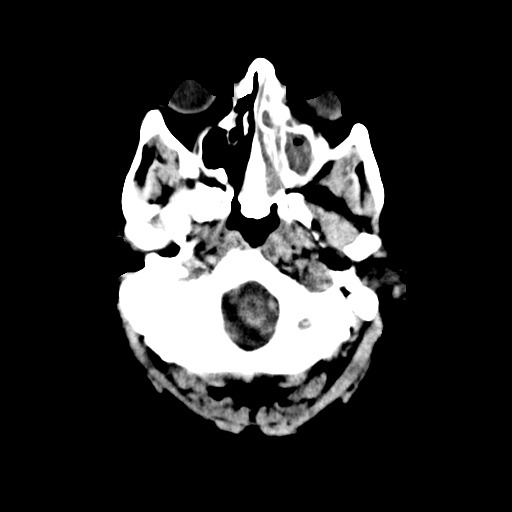
[im 2/32  bone]
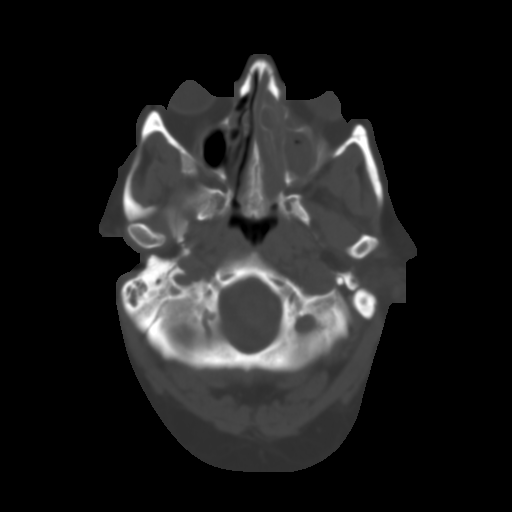
[im 4/32  brain]
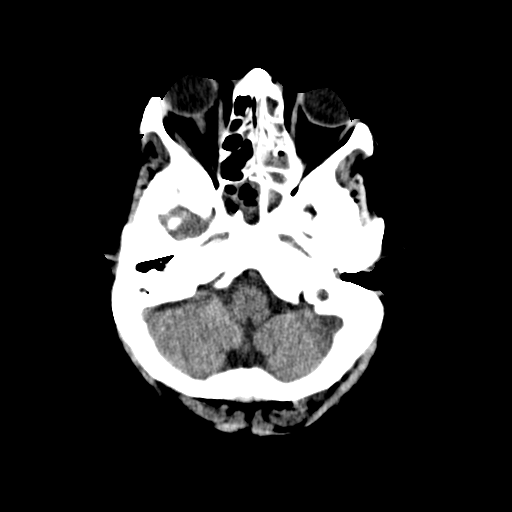
[im 6/32  brain]
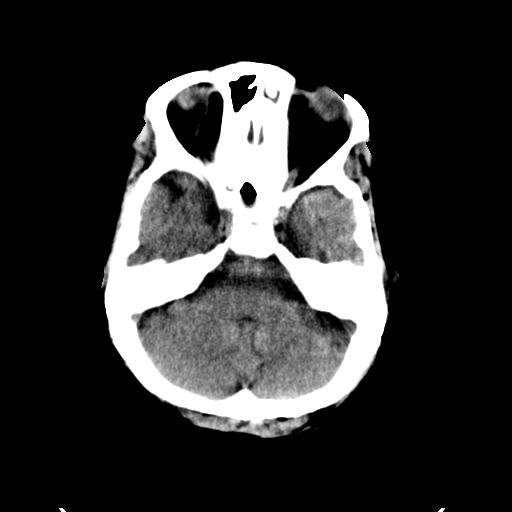
[im 8/32  brain]
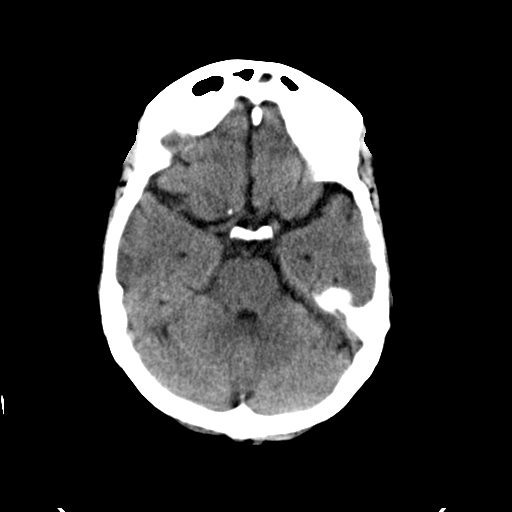
[im 9/32  brain]
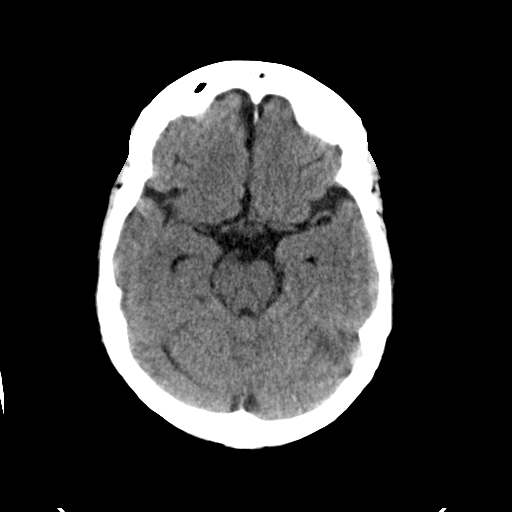
[im 9/32  bone]
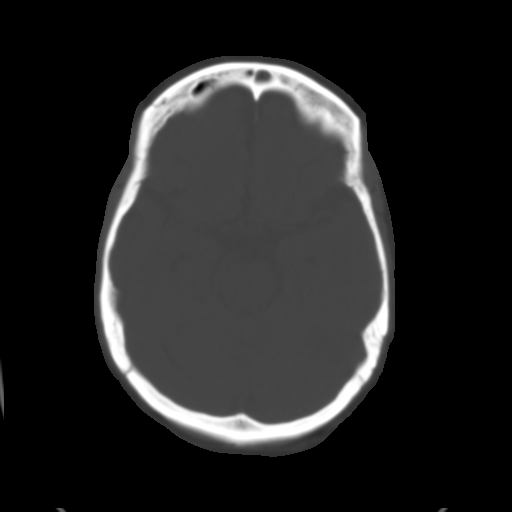
[im 11/32  brain]
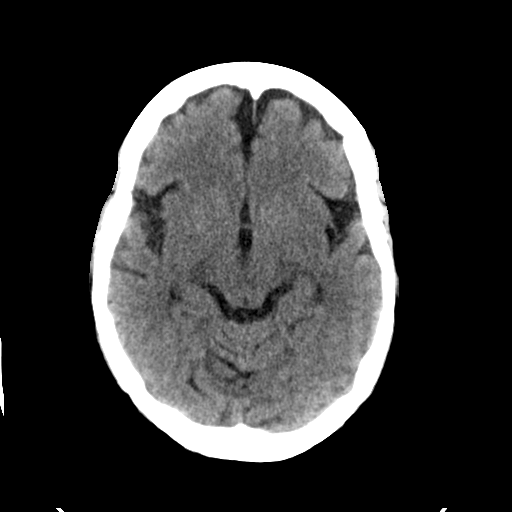
[im 13/32  brain]
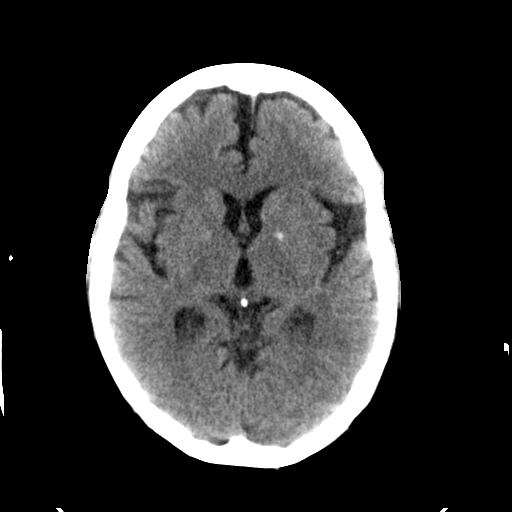
[im 15/32  brain]
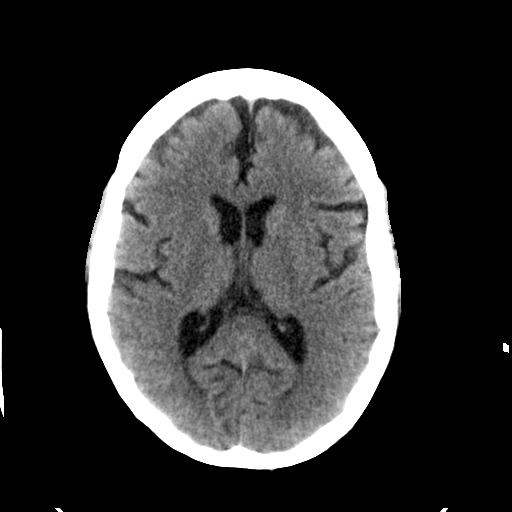
[im 17/32  brain]
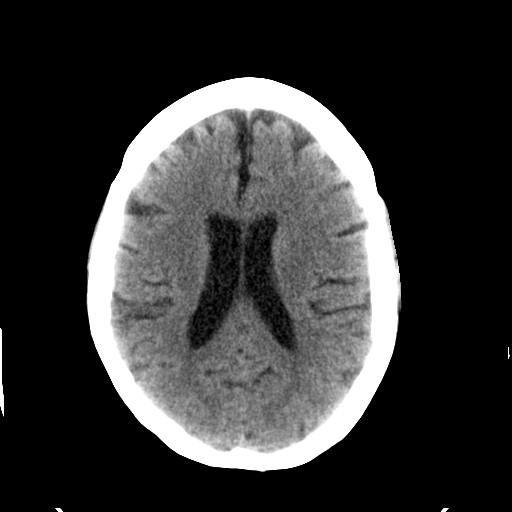
[im 17/32  bone]
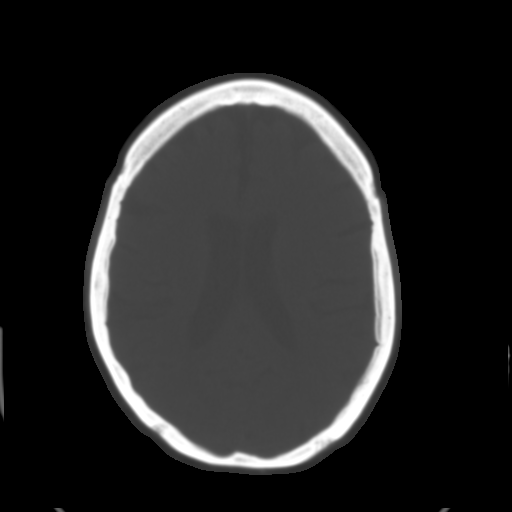
[im 19/32  brain]
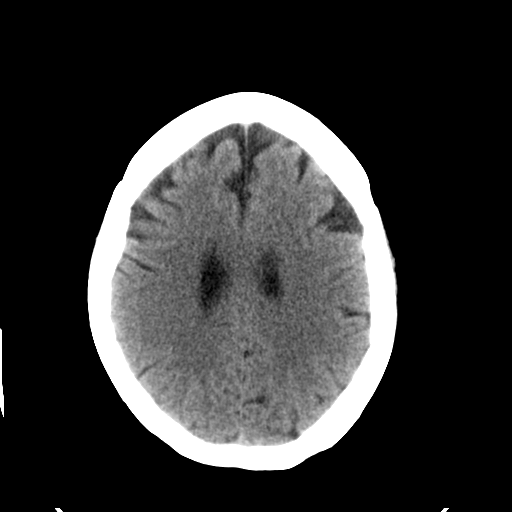
[im 21/32  brain]
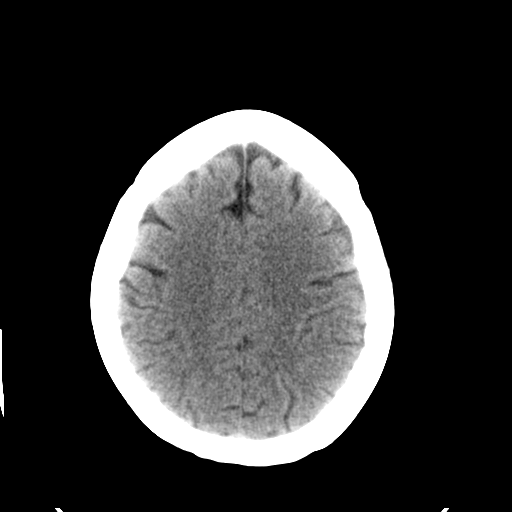
[im 23/32  brain]
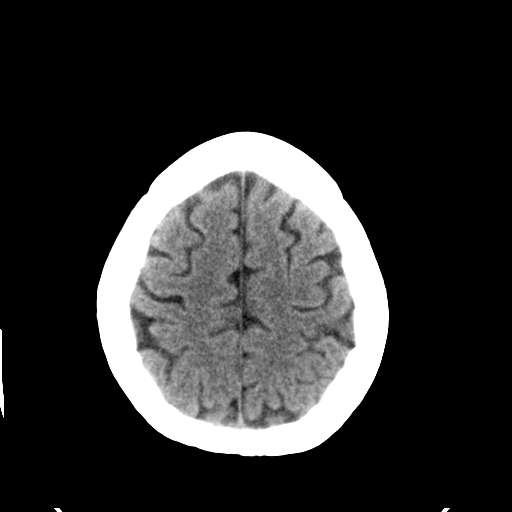
[im 24/32  brain]
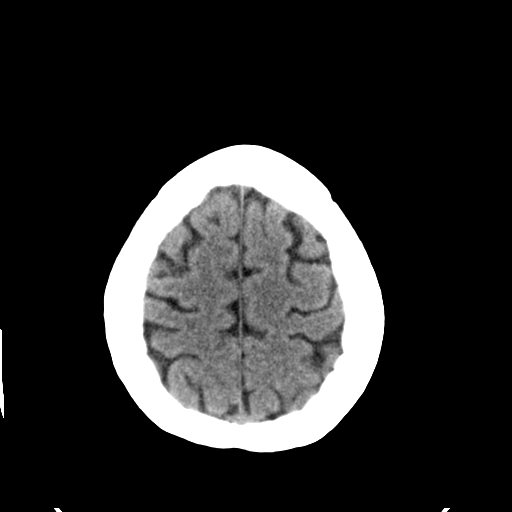
[im 24/32  bone]
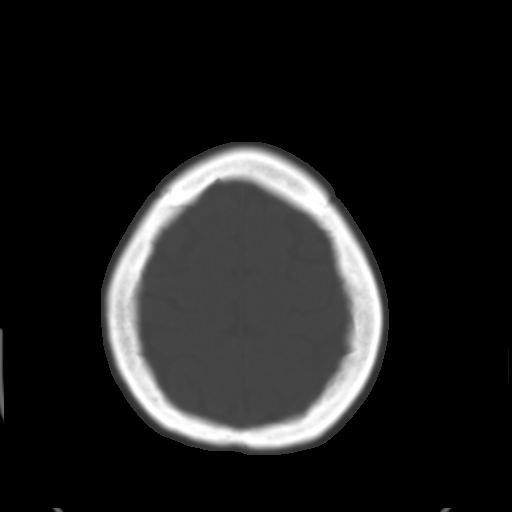
[im 26/32  brain]
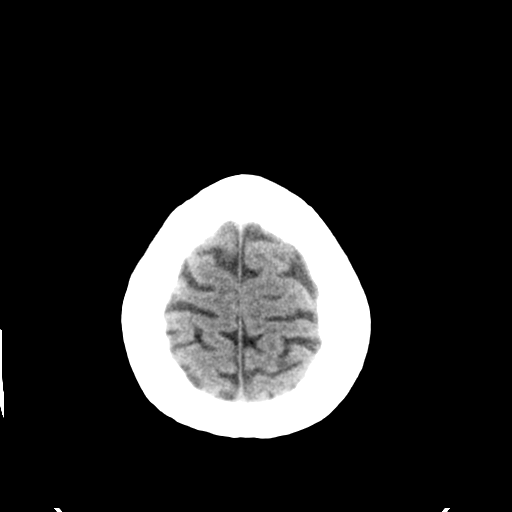
[im 28/32  brain]
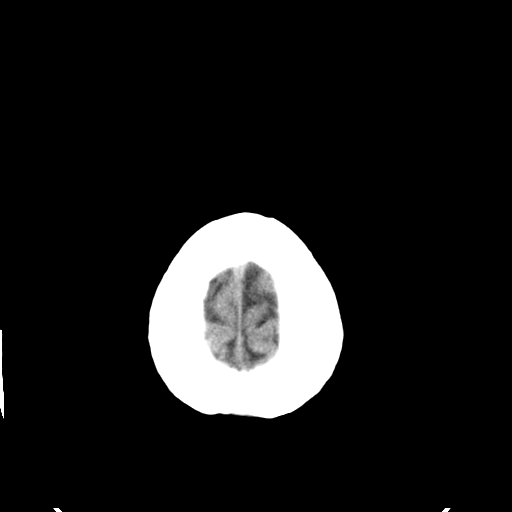
[im 30/32  brain]
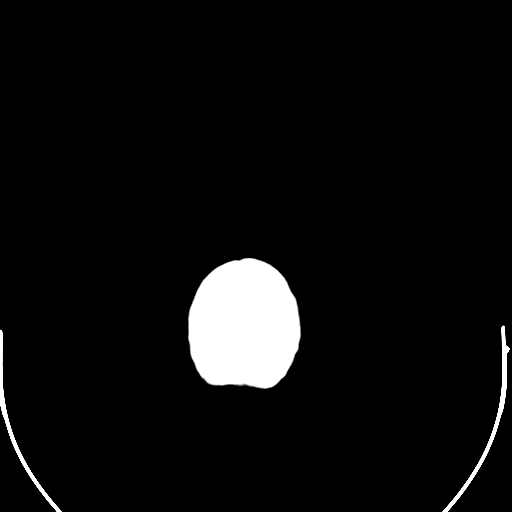

[16 of 30 positions shown; findings below may reference images not displayed]

FINDINGS: The ventricles and sulci are normal. No intraparenchymal hemorrhage,
mass effect nor midline shift. No acute large vascular territory
infarcts.

No abnormal extra-axial fluid collections. Basal cisterns are
patent.

No skull fracture. The included ocular globes and orbital contents
are non-suspicious. Partially imaged severe paranasal sinusitis,
near complete opacification of LEFT frontal, bilateral sphenoid air
cells. Bilateral metal urine mastoid effusions without aerosol
coalescence.
IMPRESSION: No acute intracranial process.

Severe paranasal sinusitis.

Bilateral middle ear and mastoid effusions.

  By: Auanasios Bethanis

## 2016-04-18 ENCOUNTER — Emergency Department
Admission: EM | Admit: 2016-04-18 | Discharge: 2016-04-18 | Disposition: A | Discharge status: Home / Self Care | Payer: Medicare Other | Attending: Emergency Medicine | Admitting: Emergency Medicine

## 2016-04-18 ENCOUNTER — Telehealth: Payer: Self-pay

## 2016-04-18 ENCOUNTER — Encounter: Payer: Self-pay | Admitting: Emergency Medicine

## 2016-04-18 DIAGNOSIS — E1122 Type 2 diabetes mellitus with diabetic chronic kidney disease: Secondary | ICD-10-CM | POA: Insufficient documentation

## 2016-04-18 DIAGNOSIS — R197 Diarrhea, unspecified: Secondary | ICD-10-CM | POA: Insufficient documentation

## 2016-04-18 DIAGNOSIS — R1084 Generalized abdominal pain: Secondary | ICD-10-CM

## 2016-04-18 DIAGNOSIS — J449 Chronic obstructive pulmonary disease, unspecified: Secondary | ICD-10-CM | POA: Insufficient documentation

## 2016-04-18 DIAGNOSIS — E039 Hypothyroidism, unspecified: Secondary | ICD-10-CM | POA: Insufficient documentation

## 2016-04-18 DIAGNOSIS — R112 Nausea with vomiting, unspecified: Secondary | ICD-10-CM | POA: Diagnosis not present

## 2016-04-18 DIAGNOSIS — J45909 Unspecified asthma, uncomplicated: Secondary | ICD-10-CM | POA: Diagnosis not present

## 2016-04-18 DIAGNOSIS — I509 Heart failure, unspecified: Secondary | ICD-10-CM | POA: Diagnosis not present

## 2016-04-18 DIAGNOSIS — E781 Pure hyperglyceridemia: Secondary | ICD-10-CM | POA: Diagnosis not present

## 2016-04-18 DIAGNOSIS — N183 Chronic kidney disease, stage 3 (moderate): Secondary | ICD-10-CM | POA: Insufficient documentation

## 2016-04-18 DIAGNOSIS — I130 Hypertensive heart and chronic kidney disease with heart failure and stage 1 through stage 4 chronic kidney disease, or unspecified chronic kidney disease: Secondary | ICD-10-CM | POA: Insufficient documentation

## 2016-04-18 DIAGNOSIS — Z794 Long term (current) use of insulin: Secondary | ICD-10-CM | POA: Diagnosis not present

## 2016-04-18 DIAGNOSIS — R1111 Vomiting without nausea: Secondary | ICD-10-CM | POA: Diagnosis not present

## 2016-04-18 DIAGNOSIS — R1030 Lower abdominal pain, unspecified: Secondary | ICD-10-CM | POA: Diagnosis present

## 2016-04-18 LAB — CBC WITH DIFFERENTIAL/PLATELET
BASOS PCT: 1 %
Basophils Absolute: 0.1 10*3/uL (ref 0–0.1)
EOS PCT: 1 %
Eosinophils Absolute: 0.1 10*3/uL (ref 0–0.7)
HCT: 39.4 % (ref 35.0–47.0)
Hemoglobin: 14 g/dL (ref 12.0–16.0)
LYMPHS ABS: 3.3 10*3/uL (ref 1.0–3.6)
Lymphocytes Relative: 45 %
MCH: 30.3 pg (ref 26.0–34.0)
MCHC: 35.6 g/dL (ref 32.0–36.0)
MCV: 85.2 fL (ref 80.0–100.0)
MONO ABS: 0.6 10*3/uL (ref 0.2–0.9)
Monocytes Relative: 8 %
Neutro Abs: 3.2 10*3/uL (ref 1.4–6.5)
Neutrophils Relative %: 45 %
PLATELETS: 270 10*3/uL (ref 150–440)
RBC: 4.62 MIL/uL (ref 3.80–5.20)
RDW: 14 % (ref 11.5–14.5)
WBC: 7.3 10*3/uL (ref 3.6–11.0)

## 2016-04-18 LAB — COMPREHENSIVE METABOLIC PANEL
ALK PHOS: 142 U/L — AB (ref 38–126)
ALT: 53 U/L (ref 14–54)
AST: 56 U/L — ABNORMAL HIGH (ref 15–41)
Albumin: 3.9 g/dL (ref 3.5–5.0)
Anion gap: 16 — ABNORMAL HIGH (ref 5–15)
BUN: 28 mg/dL — ABNORMAL HIGH (ref 6–20)
CALCIUM: 9.7 mg/dL (ref 8.9–10.3)
CO2: 18 mmol/L — ABNORMAL LOW (ref 22–32)
CREATININE: 1.33 mg/dL — AB (ref 0.44–1.00)
Chloride: 101 mmol/L (ref 101–111)
GFR, EST AFRICAN AMERICAN: 52 mL/min — AB (ref >60)
GFR, EST NON AFRICAN AMERICAN: 45 mL/min — AB (ref >60)
Glucose, Bld: 261 mg/dL — ABNORMAL HIGH (ref 65–99)
Potassium: 4.6 mmol/L (ref 3.5–5.1)
Sodium: 135 mmol/L (ref 135–145)
Total Bilirubin: 1 mg/dL (ref 0.3–1.2)
Total Protein: 6.9 g/dL (ref 6.5–8.1)

## 2016-04-18 LAB — LIPID PANEL
Cholesterol: 296 mg/dL — ABNORMAL HIGH (ref 0–200)
LDL Cholesterol: UNDETERMINED mg/dL (ref 0–99)
Triglycerides: 2813 mg/dL — ABNORMAL HIGH (ref <150)
VLDL: UNDETERMINED mg/dL (ref 0–40)

## 2016-04-18 LAB — URINALYSIS COMPLETE WITH MICROSCOPIC (ARMC ONLY)
BILIRUBIN URINE: NEGATIVE
Bacteria, UA: NONE SEEN
HGB URINE DIPSTICK: NEGATIVE
NITRITE: NEGATIVE
Protein, ur: 100 mg/dL — AB
SPECIFIC GRAVITY, URINE: 1.029 (ref 1.005–1.030)
pH: 5 (ref 5.0–8.0)

## 2016-04-18 LAB — LIPASE, BLOOD: LIPASE: 32 U/L (ref 11–51)

## 2016-04-18 MED ORDER — PROCHLORPERAZINE EDISYLATE 5 MG/ML IJ SOLN
10.0000 mg | Freq: Once | INTRAMUSCULAR | Status: AC
  Administered 2016-04-18: 10 mg via INTRAVENOUS
  Filled 2016-04-18: qty 2

## 2016-04-18 MED ORDER — PROCHLORPERAZINE EDISYLATE 5 MG/ML IJ SOLN
INTRAMUSCULAR | Status: AC
  Administered 2016-04-18: 10 mg via INTRAVENOUS
  Filled 2016-04-18: qty 2

## 2016-04-18 MED ORDER — SODIUM CHLORIDE 0.9 % IV BOLUS (SEPSIS)
1000.0000 mL | Freq: Once | INTRAVENOUS | Status: AC
  Administered 2016-04-18: 1000 mL via INTRAVENOUS

## 2016-04-18 NOTE — ED Triage Notes (Signed)
Pt presents with diarrhea, vomiting and nausea for two days. Pt thinks she may be dehrydrated.

## 2016-04-18 NOTE — ED Provider Notes (Signed)
[facility] Emergency Department Provider Note   ____________________________________________   First MD Initiated Contact with Patient 04/18/16 1816     (approximate)  I have reviewed the triage vital signs and the nursing notes.   HISTORY  Chief Complaint Diarrhea   HPI [name] is a 50 y.o. female with a history of renal failure as well as bipolar disorder COPD and depression who is presenting to the emergency department today with lower abdominal pain over the past 2 days. She says it is associated with diarrhea and she says she has had more than 10 episodes of diarrhea but none since and emesis morning. She said that she also vomited 2-3 times this morning and has had no vomiting since. She says the pain is an 8 out of 10 into her lower abdomen at this time. It does not radiate. She is not complaining of any burning with urination. Says that she also has a -10 frontal headache which is throbbing. Says that she has a history of migraine headaches and this feels similar. Says that she had a recent course of Keflex several weeks ago for urinary tract infection. No known sick contacts.   Past Medical History:  Diagnosis Date  . Acute renal failure ([place])   . Asthma   . Bipolar depression ([place])   . CHF (congestive heart failure) ([place])   . Chronic headaches   . COPD (chronic obstructive pulmonary disease) ([place])   . Diabetes mellitus without complication (HCC)    Type 2, insulin dependent. Patient also takes Metformin.  . Diabetic neuropathy ([place])   . GERD (gastroesophageal reflux disease)   . Headache   . Heart attack ([place])   . High blood pressure   . Hyperlipidemia   . Hypertension   . Hypomagnesemia   . Hypothyroid   . Hypoxemia   . Irregular heart rhythm   . Liver lesion    Favored to be benign on CT; MRI of abd w/contract in Aug 2016. Liver Biopsy-Negative, March 2016  . Morbid obesity ([place])   . OSA (obstructive sleep apnea)   . Pinched  nerve    Back  . RLS (restless legs syndrome)   . Seasonal allergies   . Sleep apnea   . Urinary incontinence   . Vitamin D deficiency     Patient Active Problem List   Diagnosis Date Noted  . Urinary tract infection 03/14/2016  . Adenomatous colon polyp 10/15/2015  . Airway hyperreactivity 10/15/2015  . Bipolar affective disorder ([place]) 10/15/2015  . CN (constipation) 10/15/2015  . CAFL (chronic airflow limitation) ([place]) 10/15/2015  . Drug-induced hepatic toxicity 10/15/2015  . Blood in feces 10/15/2015  . HLD (hyperlipidemia) 10/15/2015  . Hyperthyroidism 10/15/2015  . Hypoxemia 10/15/2015  . NASH (nonalcoholic steatohepatitis) 10/15/2015  . Adiposity 10/15/2015  . Restless leg syndrome 10/15/2015  . Type 2 diabetes mellitus ([place]) 10/15/2015  . Severe obstructive sleep apnea 10/15/2015  . Elevated alkaline phosphatase level 09/22/2015  . Urinary bladder incontinence 09/22/2015  . Diarrhea 08/18/2015  . Low back pain with left-sided sciatica 08/18/2015  . Encounter for monitoring tricyclic antidepressant therapy 05/13/2015  . Bipolar disorder ([place]) 05/11/2015  . Liver disease 05/11/2015  . Diabetes ([place]) 05/11/2015  . Apnea, sleep 05/05/2015  . Heart valve disease 05/05/2015  . Chronic kidney disease, stage III (moderate) 03/15/2015  . Anemia 03/15/2015  . Migraine 03/12/2015  . COPD (chronic obstructive pulmonary disease) ([place]) 01/20/2015  . 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  GERD (gastroesophageal reflux disease) 01/20/2015  . Disorder of smooth muscle 01/20/2015  . Hepatic encephalopathy ([place]) 01/20/2015  . Hypothyroid   . Vitamin D deficiency   . Urinary incontinence   . Liver lesion   . Hypomagnesemia   . Obstructive sleep apnea 10/20/2013  . PERIPHERAL NEUROPATHY 03/04/2009  . HEMORRHOIDS, EXTERNAL 09/03/2008  . ALLERGIC RHINITIS 07/04/2007  . ASTHMA, PERSISTENT, MODERATE 07/04/2007  . INTERSTITIAL CYSTITIS 07/04/2007  . HYPERTRIGLYCERIDEMIA  07/03/2007  . FATIGUE, CHRONIC 07/03/2007  . HYPERTENSION, BENIGN ESSENTIAL 06/19/2007    Past Surgical History:  Procedure Laterality Date  . ABDOMINAL HYSTERECTOMY    . ABDOMINAL HYSTERECTOMY     Partial  . BLADDER SURGERY     x 2  . CHOLECYSTECTOMY    . LIVER BIOPSY  March 2016   Negative  . LUNG BIOPSY    . TUBAL LIGATION      Prior to Admission medications   Medication Sig Start Date End Date Taking? Authorizing Provider  ACCU-CHEK AVIVA PLUS test strip  07/26/15   Historical Provider, MD  ACCU-CHEK SOFTCLIX LANCETS lancets  07/26/15   Historical Provider, MD  albuterol (PROVENTIL HFA;VENTOLIN HFA) 108 (90 Base) MCG/ACT inhaler Inhale 2 puffs into the lungs every 4 (four) hours as needed for wheezing or shortness of breath. 11/19/15   Arnetha Courser, MD  amLODipine (NORVASC) 2.5 MG tablet Take 1 tablet (2.5 mg total) by mouth daily. 01/21/16   Arnetha Courser, MD  Choline Fenofibrate (FENOFIBRIC ACID) 45 MG CPDR Take 1 capsule by mouth daily. 11/04/15   Arnetha Courser, MD  cyclobenzaprine (FLEXERIL) 10 MG tablet Take 1 tablet (10 mg total) by mouth every 8 (eight) hours as needed for muscle spasms. 03/14/16   Arnetha Courser, MD  DULoxetine (CYMBALTA) 60 MG capsule Take 120 mg by mouth daily.    Historical Provider, MD  fluticasone (FLONASE) 50 MCG/ACT nasal spray Place 2 sprays into both nostrils daily. 08/03/15   Arnetha Courser, MD  ibuprofen (ADVIL,MOTRIN) 600 MG tablet Take 1 tablet (600 mg total) by mouth every 8 (eight) hours as needed. 03/27/16   Lisa Roca, MD  insulin lispro (HUMALOG) 100 UNIT/ML injection Per endo: Sliding scale goes up by 5 units every 50; with meals 70-150=30units 151-200=35units 201-250=40units 251-300=45units 301-350=50units 351-400=55units 401-450=60units 01/17/16   Arnetha Courser, MD  insulin NPH Human (HUMULIN N,NOVOLIN N) 100 UNIT/ML injection Per endo: 75 units in am, 50 units at lunch and 75 with supper 04/11/16   Arnetha Courser, MD  Insulin  Syringe-Needle U-100 (ULTICARE INSULIN SYRINGE) 31G X 1/4" 0.3 ML MISC 1 each by Does not apply route 5 (five) times daily as needed. 01/25/16   Arnetha Courser, MD  lamoTRIgine (LAMICTAL) 150 MG tablet Take 150 mg by mouth daily.  03/22/15   Historical Provider, MD  levothyroxine (SYNTHROID, LEVOTHROID) 75 MCG tablet Take 1 tablet (75 mcg total) by mouth daily. 03/22/16   Arnetha Courser, MD  LINZESS 145 MCG CAPS capsule Take 145 mcg by mouth daily.  09/30/15   Historical Provider, MD  loperamide (IMODIUM A-D) 2 MG tablet Take 1 tablet (2 mg total) by mouth 4 (four) times daily as needed for diarrhea or loose stools. 02/18/16   Harvest Dark, MD  magnesium oxide (MAG-OX) 400 MG tablet Take 1 tablet (400 mg total) by mouth 3 (three) times daily. 01/12/16   Arnetha Courser, MD  meloxicam (MOBIC) 15 MG tablet Take 15 mg by mouth daily. 09/03/15  Historical Provider, MD  metoprolol tartrate (LOPRESSOR) 25 MG tablet Take 0.5 tablets (12.5 mg total) by mouth 2 (two) times daily. 11/04/15   Arnetha Courser, MD  Multiple Vitamin (MULTIVITAMIN) capsule Take 1 capsule by mouth daily.     Historical Provider, MD  nitrofurantoin, macrocrystal-monohydrate, (MACROBID) 100 MG capsule Take 1 capsule (100 mg total) by mouth 2 (two) times daily. 03/21/16   Arnetha Courser, MD  nystatin (MYCOSTATIN/NYSTOP) powder Apply topically 3 (three) times daily. As needed to affected area 03/14/16   Arnetha Courser, MD  ondansetron (ZOFRAN) 4 MG tablet Take 1 tablet (4 mg total) by mouth daily as needed for nausea or vomiting. 12/12/15   Earleen Newport, MD  QUEtiapine (SEROQUEL) 25 MG tablet Take 25 mg by mouth every morning. Take one in am, one at lunch    Historical Provider, MD  QUEtiapine (SEROQUEL) 400 MG tablet Take 800 mg by mouth at bedtime. Take 2 tabs at bedtime    Historical Provider, MD  ranitidine (ZANTAC) 300 MG tablet Take 1 tablet (300 mg total) by mouth at bedtime. 11/19/15   Arnetha Courser, MD  REXULTI 1 MG TABS Take 1  mg by mouth daily.  07/26/15   Historical Provider, MD  topiramate (TOPAMAX) 25 MG tablet Take 1 tablet (25 mg total) by mouth 2 (two) times daily. 11/02/15   Garvin Fila, MD  zolpidem (AMBIEN) 10 MG tablet Take 10 mg by mouth at bedtime.  07/26/15   Historical Provider, MD    Allergies Morphine and related; Codeine; Codeine; Hydrocodone-acetaminophen; Mirtazapine; Morphine and related; Sulfamethoxazole-trimethoprim; and Vicodin [hydrocodone-acetaminophen]  Family History  Problem Relation Age of Onset  . Diabetes Mother   . Heart disease Mother   . Hyperlipidemia Mother   . Hypertension Mother   . Kidney disease Mother   . Mental illness Mother   . Hypothyroidism Mother   . Stroke Mother   . Osteoporosis Mother   . Glaucoma Mother   . Congestive Heart Failure Mother   . Hypertension Father   . Asthma Daughter   . Cancer Daughter     throat  . Heart disease Maternal Grandfather   . Rheum arthritis Maternal Grandfather   . Cancer Maternal Grandfather     liver  . Cancer Paternal Grandmother     lung    Social History Social History  Substance Use Topics  . Smoking status: Never Smoker  . Smokeless tobacco: Never Used  . Alcohol use No    Review of Systems Constitutional: No fever/chills Eyes: No visual changes. ENT: No sore throat. Cardiovascular: Denies chest pain. Respiratory: Denies shortness of breath. Gastrointestinal: No constipation. Genitourinary: Negative for dysuria. Musculoskeletal: Negative for back pain. Skin: Negative for rash. Neurological: Negative for headaches, focal weakness or numbness.  10-point ROS otherwise negative.  ____________________________________________   PHYSICAL EXAM:  VITAL SIGNS: ED Triage Vitals  Enc Vitals Group     BP 04/18/16 1628 (!) 146/95     Pulse Rate 04/18/16 1628 (!) 112     Resp 04/18/16 1628 20     Temp 04/18/16 1628 98.4 F (36.9 C)     Temp Source 04/18/16 1628 Oral     SpO2 04/18/16 1628 96 %      Weight 04/18/16 1629 189 lb (85.7 kg)     Height 04/18/16 1629 4' 11"  (1.499 m)     Head Circumference --      Peak Flow --      Pain Score  04/18/16 1629 8     Pain Loc --      Pain Edu? --      Excl. in [place]? --     Constitutional: Alert and oriented. Well appearing and in no acute distress. Eyes: Conjunctivae are normal. PERRL. EOMI. Head: Atraumatic. Nose: No congestion/rhinnorhea. Mouth/Throat: Mucous membranes are moist.  Neck: No stridor.   Cardiovascular: Normal rate, regular rhythm. Grossly normal heart sounds. Respiratory: Normal respiratory effort.  No retractions. Lungs CTAB. Gastrointestinal: Soft with mild to moderate tenderness across the lower abdomen. No distention. No CVA tenderness. Musculoskeletal: No lower extremity tenderness nor edema.  No joint effusions. Neurologic:  Normal speech and language. No gross focal neurologic deficits are appreciated.  Skin:  Skin is warm, dry and intact. No rash noted. Psychiatric: Mood and affect are normal. Speech and behavior are normal.  ____________________________________________   LABS (all labs ordered are listed, but only abnormal results are displayed)  Labs Reviewed  COMPREHENSIVE METABOLIC PANEL - Abnormal; Notable for the following:       Result Value   CO2 18 (*)    Glucose, Bld 261 (*)    BUN 28 (*)    Creatinine, Ser 1.33 (*)    AST 56 (*)    Alkaline Phosphatase 142 (*)    GFR calc non Af Amer 45 (*)    GFR calc Af Amer 52 (*)    Anion gap 16 (*)    All other components within normal limits  URINALYSIS COMPLETEWITH MICROSCOPIC ([facility] ONLY) - Abnormal; Notable for the following:    Color, Urine YELLOW (*)    APPearance CLEAR (*)    Glucose, UA >500 (*)    Ketones, ur TRACE (*)    Protein, ur 100 (*)    Leukocytes, UA TRACE (*)    Squamous Epithelial / LPF 0-5 (*)    All other components within normal limits  LIPID PANEL - Abnormal; Notable for the following:    Cholesterol 296 (*)    Triglycerides  2,813 (*)    All other components within normal limits  GASTROINTESTINAL PANEL BY PCR, STOOL (REPLACES STOOL CULTURE)  C DIFFICILE QUICK SCREEN W PCR REFLEX  URINE CULTURE  LIPASE, BLOOD  CBC WITH DIFFERENTIAL/PLATELET   ____________________________________________  EKG   ____________________________________________  RADIOLOGY   ____________________________________________   PROCEDURES  Procedure(s) performed:   Procedures  Critical Care performed:   ____________________________________________   INITIAL IMPRESSION / ASSESSMENT AND PLAN / ED COURSE  Pertinent labs & imaging results that were available during my care of the patient were reviewed by me and considered in my medical decision making (see chart for details).  Patient with multiple recent visits to the emergency department for abdominal pain. Has been treated with multiple courses of by mouth antibiotics. We were checking the patient's electrolytes but the lab needed to send him degrees for a because the amount of lipid inside of them. We are still pending these results.  Clinical Course   ----------------------------------------- 10:30 PM on 04/18/2016 -----------------------------------------  Patient is resting completely. She has not had any episodes of diarrhea or vomiting in the emergency department. Lab abnormalities include hypertriglyceridemia which has improved since early May. She continues on fenofibrate. I discussed the elevated triglycerides with the hospitalist, Dr. Ara Kussmaul who recommended continued outpatient workup. I discussed with patient and will see [name], her primary care doctor to continue her workup outpatient. Likely viral cause versus chronic symptoms. She has been in the emergency department multiple times for  abdominal pain including several recent visits over the past several months. Does not appear to be a surgical cause of this time. The patient appears well. Her heart rate  came down after fluids. She is comfortable and without any distress.  ____________________________________________   FINAL CLINICAL IMPRESSION(S) / ED DIAGNOSES  Abdominal pain. Nausea vomiting and diarrhea. Hypertriglyceridemia.    NEW MEDICATIONS STARTED DURING THIS VISIT:  New Prescriptions   No medications on file     Note:  This document was prepared using Dragon voice recognition software and may include unintentional dictation errors.    Orbie Pyo, MD 04/18/16 [phone]

## 2016-04-18 NOTE — ED Triage Notes (Signed)
Lab states patient's blood is extremely lipemic and they cannot run the tests but have to send the blood to [place].

## 2016-04-18 NOTE — Telephone Encounter (Signed)
Rn spoke with Andy Gauss at [facility]. Rn explain pt was last seen 03/2015 for headaches. Pt was to schedule 2 month follow up. Pt never schedule appt. Rn stated the medication refill will be denied. Bria at [facility] verbalized understanding.

## 2016-04-19 DIAGNOSIS — J449 Chronic obstructive pulmonary disease, unspecified: Secondary | ICD-10-CM | POA: Diagnosis not present

## 2016-04-19 DIAGNOSIS — R0902 Hypoxemia: Secondary | ICD-10-CM | POA: Diagnosis not present

## 2016-04-19 DIAGNOSIS — G4733 Obstructive sleep apnea (adult) (pediatric): Secondary | ICD-10-CM | POA: Diagnosis not present

## 2016-04-20 LAB — URINE CULTURE

## 2016-05-01 ENCOUNTER — Ambulatory Visit (INDEPENDENT_AMBULATORY_CARE_PROVIDER_SITE_OTHER): Payer: Medicare Other | Admitting: Family Medicine

## 2016-05-01 VITALS — BP 124/76 | HR 94 | Temp 98.7°F | Resp 16 | Wt 186.0 lb

## 2016-05-01 DIAGNOSIS — R5383 Other fatigue: Secondary | ICD-10-CM | POA: Diagnosis not present

## 2016-05-01 DIAGNOSIS — E1165 Type 2 diabetes mellitus with hyperglycemia: Secondary | ICD-10-CM

## 2016-05-01 DIAGNOSIS — N3000 Acute cystitis without hematuria: Secondary | ICD-10-CM | POA: Diagnosis not present

## 2016-05-01 DIAGNOSIS — Z794 Long term (current) use of insulin: Secondary | ICD-10-CM | POA: Diagnosis not present

## 2016-05-01 DIAGNOSIS — E559 Vitamin D deficiency, unspecified: Secondary | ICD-10-CM | POA: Diagnosis not present

## 2016-05-01 DIAGNOSIS — R748 Abnormal levels of other serum enzymes: Secondary | ICD-10-CM

## 2016-05-01 DIAGNOSIS — K7581 Nonalcoholic steatohepatitis (NASH): Secondary | ICD-10-CM | POA: Diagnosis not present

## 2016-05-01 DIAGNOSIS — E781 Pure hyperglyceridemia: Secondary | ICD-10-CM | POA: Diagnosis not present

## 2016-05-01 LAB — COMPLETE METABOLIC PANEL WITHOUT GFR
ALT: 19 U/L (ref 6–29)
AST: 18 U/L (ref 10–35)
Albumin: 4 g/dL (ref 3.6–5.1)
Alkaline Phosphatase: 134 U/L — ABNORMAL HIGH (ref 33–130)
BUN: 16 mg/dL (ref 7–25)
CO2: 22 mmol/L (ref 20–31)
Calcium: 9.9 mg/dL (ref 8.6–10.4)
Chloride: 98 mmol/L (ref 98–110)
Creat: 1.06 mg/dL — ABNORMAL HIGH (ref 0.50–1.05)
GFR, Est African American: 70 mL/min
GFR, Est Non African American: 61 mL/min
Glucose, Bld: 399 mg/dL — ABNORMAL HIGH (ref 65–99)
Potassium: 4.4 mmol/L (ref 3.5–5.3)
Sodium: 133 mmol/L — ABNORMAL LOW (ref 135–146)
Total Bilirubin: 0.5 mg/dL (ref 0.2–1.2)
Total Protein: 6.6 g/dL (ref 6.1–8.1)

## 2016-05-01 LAB — VITAMIN B12: Vitamin B-12: 518 pg/mL (ref 200–1100)

## 2016-05-01 LAB — MAGNESIUM: MAGNESIUM: 1.5 mg/dL (ref 1.5–2.5)

## 2016-05-01 MED ORDER — INSULIN NPH (HUMAN) (ISOPHANE) 100 UNIT/ML ~~LOC~~ SUSP: SUBCUTANEOUS | 1 refills | Status: DC

## 2016-05-01 MED ORDER — INSULIN LISPRO 100 UNIT/ML ~~LOC~~ SOLN: SUBCUTANEOUS | 0 refills | Status: DC

## 2016-05-01 MED ORDER — ICOSAPENT ETHYL 1 G PO CAPS: 2.0000 | ORAL_CAPSULE | Freq: Two times a day (BID) | ORAL | 11 refills | Status: DC

## 2016-05-01 MED ORDER — ATORVASTATIN CALCIUM 40 MG PO TABS: 40.0000 mg | ORAL_TABLET | Freq: Every day | ORAL | 1 refills | Status: DC

## 2016-05-01 NOTE — Assessment & Plan Note (Signed)
Check Mg2+

## 2016-05-01 NOTE — Patient Instructions (Signed)
Stay hydrated Get back on insulin STOP fenofibrate START atorvastatin and Vascepa Return in 6 weeks for labs Really work on low fat diet and weight loss  Check out the information at familydoctor.org entitled "Nutrition for Weight Loss: What You Need to Know about Fad Diets" Try to lose between 1-2 pounds per week by taking in fewer calories and burning off more calories You can succeed by limiting portions, limiting foods dense in calories and fat, becoming more active, and drinking 8 glasses of water a day (64 ounces) Don't skip meals, especially breakfast, as skipping meals may alter your metabolism Do not use over-the-counter weight loss pills or gimmicks that claim rapid weight loss A healthy BMI (or body mass index) is between 18.5 and 24.9 You can calculate your ideal BMI at the [place] website [employer]Mon[employer]fr  Try to limit saturated fats in your diet (bologna, hot dogs, barbeque, cheeseburgers, hamburgers, steak, bacon, sausage, cheese, etc.) and get more fresh fruits, vegetables, and whole grains

## 2016-05-01 NOTE — Assessment & Plan Note (Signed)
Check isoenzymes

## 2016-05-01 NOTE — Assessment & Plan Note (Signed)
Check vit D and vit B12

## 2016-05-01 NOTE — Assessment & Plan Note (Signed)
Taking Tricor but not effective; refer to endocrinologist; start statin and vascepa; low fat diet

## 2016-05-01 NOTE — Assessment & Plan Note (Signed)
Reviewed last liver enzymes; work on low fat diet and weight loss and increase activity (build up gradually)

## 2016-05-01 NOTE — Assessment & Plan Note (Signed)
Get back on insulin; foot exam by MD today; work on weight loss; refer to endo since she has seen them before and requires such high doses of insulin

## 2016-05-01 NOTE — Progress Notes (Signed)
BP 124/76   Pulse 94   Temp 98.7 F (37.1 C) (Oral)   Resp 16   Wt 186 lb (84.4 kg)   SpO2 98%   BMI 37.57 kg/m    Subjective:    Patient ID: [name], female    DOB: Jan 14, 1966, 50 y.o.   MRN: [id number]  HPI: [name] is a 50 y.o. female  Chief Complaint  Patient presents with  . Follow-up   Patient was in the hospital for diarrhea and vomiting She was seenin the ER on August 15th Has a sharp pain like a muscle spasm on the LUQ; happens when she moves a certain way; has to stop for a minute She talked to Dr. Allen Norris about her stomach too, but they're really backed up with loss of GI doctors locally Her TG were 2813 and she said the ER doctor told her to see her primary about this She ran out of insulin 3 days ago; check FSBS and it says "high" She never got to the endocrinologist; [place] endocrinologist was in [place]; just quit going b/c of transportation Some dysuria and frequency and odor to urine; no fevers Reviewed recent labs CBC was normal; WBC 7.3k, H/H 14.0/39.4 Urine culture showed multiple species Creatinine 1.33, GFR 45; glucose 261; alk phos 142, AST 56, ALT 53 (no alcohol) Amylase normal 32  Relevant past medical, surgical, family and social history reviewed Past Medical History:  Diagnosis Date  . Acute renal failure ([place])   . Asthma   . Bipolar depression ([place])   . CHF (congestive heart failure) ([place])   . Chronic headaches   . COPD (chronic obstructive pulmonary disease) ([place])   . Diabetes mellitus without complication (HCC)    Type 2, insulin dependent. Patient also takes Metformin.  . Diabetic neuropathy ([place])   . GERD (gastroesophageal reflux disease)   . Headache   . Heart attack ([place])   . High blood pressure   . Hyperlipidemia   . Hypertension   . Hypomagnesemia   . Hypothyroid   . Irregular heart rhythm   . Liver lesion    Favored to be benign on CT; MRI of abd w/contract in Aug 2016. Liver Biopsy-Negative, March 2016  .  Morbid obesity ([place])   . OSA (obstructive sleep apnea)   . Pinched nerve    Back  . RLS (restless legs syndrome)   . Seasonal allergies   . Sleep apnea   . Urinary incontinence   . Vitamin D deficiency    Past Surgical History:  Procedure Laterality Date  . ABDOMINAL HYSTERECTOMY    . ABDOMINAL HYSTERECTOMY     Partial  . BLADDER SURGERY     x 2  . CHOLECYSTECTOMY    . LIVER BIOPSY  March 2016   Negative  . LUNG BIOPSY    . TUBAL LIGATION     Family History  Problem Relation Age of Onset  . Diabetes Mother   . Heart disease Mother   . Hyperlipidemia Mother   . Hypertension Mother   . Kidney disease Mother   . Mental illness Mother   . Hypothyroidism Mother   . Stroke Mother   . Osteoporosis Mother   . Glaucoma Mother   . Congestive Heart Failure Mother   . Hypertension Father   . Asthma Daughter   . Cancer Daughter     throat  . Heart disease Maternal Grandfather   . Rheum arthritis Maternal Grandfather   . Cancer  Maternal Grandfather     liver  . Cancer Paternal Grandmother     lung   Social History  Substance Use Topics  . Smoking status: Never Smoker  . Smokeless tobacco: Never Used  . Alcohol use No    Interim medical history since last visit reviewed. Allergies and medications reviewed  Review of Systems Per HPI unless specifically indicated above     Objective:    BP 124/76   Pulse 94   Temp 98.7 F (37.1 C) (Oral)   Resp 16   Wt 186 lb (84.4 kg)   SpO2 98%   BMI 37.57 kg/m   Wt Readings from Last 3 Encounters:  05/01/16 186 lb (84.4 kg)  04/18/16 189 lb (85.7 kg)  03/27/16 185 lb (83.9 kg)    Physical Exam  Constitutional: She appears well-developed and well-nourished. No distress.  HENT:  Head: Normocephalic and atraumatic.  Eyes: EOM are normal. No scleral icterus.  Neck: No thyromegaly present.  Cardiovascular: Normal rate, regular rhythm and normal heart sounds.   No murmur heard. Pulmonary/Chest: Effort normal and breath  sounds normal. No respiratory distress. She has no wheezes.  Abdominal: Soft. Bowel sounds are normal. She exhibits no distension. There is no tenderness.  Musculoskeletal: Normal range of motion. She exhibits no edema.  Neurological: She is alert. She exhibits normal muscle tone.  Skin: Skin is warm and dry. She is not diaphoretic. No pallor.  Psychiatric: She has a normal mood and affect. Her behavior is normal. Judgment and thought content normal.   Diabetic Foot Form - Detailed   Diabetic Foot Exam - detailed Diabetic Foot exam was performed with the following findings:  Yes 05/01/2016  9:48 AM  Visual Foot Exam completed.:  Yes  Are the toenails ingrown?:  No Normal Range of Motion:  Yes Pulse Foot Exam completed.:  Yes  Right Dorsalis Pedis:  Present Left Dorsalis Pedis:  Present  Sensory Foot Exam Completed.:  Yes Swelling:  No Semmes-Weinstein Monofilament Test R Site 1-Great Toe:  Pos L Site 1-Great Toe:  Pos  R Site 4:  Pos L Site 4:  Pos  R Site 5:  Pos L Site 5:  Pos        Results for orders placed or performed in visit on 05/01/16  Urine culture  Result Value Ref Range   Colony Count >=100,000 COLONIES/ML    Preliminary Report Gram Negative Rods   Magnesium  Result Value Ref Range   Magnesium 1.5 1.5 - 2.5 mg/dL  Vitamin B12  Result Value Ref Range   Vitamin B-12 518 200 - 1,100 pg/mL  VITAMIN D 25 Hydroxy (Vit-D Deficiency, Fractures)  Result Value Ref Range   Vit D, 25-Hydroxy 12 (L) 30 - 100 ng/mL  Urinalysis w microscopic + reflex cultur  Result Value Ref Range   Color, Urine YELLOW YELLOW   APPearance CLEAR CLEAR   Specific Gravity, Urine 1.041 (H) 1.001 - 1.035   pH 5.5 5.0 - 8.0   Glucose, UA 3+ (A) NEGATIVE   Bilirubin Urine NEGATIVE NEGATIVE   Ketones, ur 1+ (A) NEGATIVE   Hgb urine dipstick NEGATIVE NEGATIVE   Protein, ur NEGATIVE NEGATIVE   Nitrite NEGATIVE NEGATIVE   Leukocytes, UA NEGATIVE NEGATIVE   WBC, UA 0-5 <=5 WBC/HPF   RBC / HPF  0-2 <=2 RBC/HPF   Squamous Epithelial / LPF 0-5 <=5 HPF   Bacteria, UA MANY (A) NONE SEEN HPF   Crystals NONE SEEN NONE SEEN HPF  Casts NONE SEEN NONE SEEN LPF   Yeast FEW (A) NONE SEEN HPF  COMPLETE METABOLIC PANEL WITH GFR  Result Value Ref Range   Sodium 133 (L) 135 - 146 mmol/L   Potassium 4.4 3.5 - 5.3 mmol/L   Chloride 98 98 - 110 mmol/L   CO2 22 20 - 31 mmol/L   Glucose, Bld 399 (H) 65 - 99 mg/dL   BUN 16 7 - 25 mg/dL   Creat 1.06 (H) 0.50 - 1.05 mg/dL   Total Bilirubin 0.5 0.2 - 1.2 mg/dL   Alkaline Phosphatase 134 (H) 33 - 130 U/L   AST 18 10 - 35 U/L   ALT 19 6 - 29 U/L   Total Protein 6.6 6.1 - 8.1 g/dL   Albumin 4.0 3.6 - 5.1 g/dL   Calcium 9.9 8.6 - 10.4 mg/dL   GFR, Est African American 70 >=60 mL/min   GFR, Est Non African American 61 >=60 mL/min  Alkaline Phosphatase Isoenzymes  Result Value Ref Range   Alkaline Phonsphatase     Intestinal Isoenzymes     Bone Isoenzymes     Liver Isoenzymes     Placental isoenzymes     Macrohepatic isoenzymes     Interpretation ALP Isoenz        Assessment & Plan:   Problem List Items Addressed This Visit      Digestive   NASH (nonalcoholic steatohepatitis)    Reviewed last liver enzymes; work on low fat diet and weight loss and increase activity (build up gradually)        Endocrine   Type 2 diabetes mellitus ([place])    Get back on insulin; foot exam by MD today; work on weight loss; refer to endo since she has seen them before and requires such high doses of insulin      Relevant Medications   insulin NPH Human (HUMULIN N,NOVOLIN N) 100 UNIT/ML injection   insulin lispro (HUMALOG) 100 UNIT/ML injection   atorvastatin (LIPITOR) 40 MG tablet   Other Relevant Orders   Ambulatory referral to Endocrinology     Genitourinary   Urinary tract infection    Recheck urine and culture today; stay hydrated      Relevant Orders   Urinalysis w microscopic + reflex cultur (Completed)     Other   Hypomagnesemia     Check Mg2+      Relevant Orders   Magnesium (Completed)   HYPERTRIGLYCERIDEMIA - Primary    Taking Tricor but not effective; refer to endocrinologist; start statin and vascepa; low fat diet      Relevant Medications   Icosapent Ethyl (VASCEPA) 1 g CAPS   atorvastatin (LIPITOR) 40 MG tablet   Other Relevant Orders   Ambulatory referral to Endocrinology   Fatigue    Check vit D and vit B12      Relevant Orders   Vitamin B12 (Completed)   VITAMIN D 25 Hydroxy (Vit-D Deficiency, Fractures) (Completed)   Elevated alkaline phosphatase level    Check isoenzymes      Relevant Orders   COMPLETE METABOLIC PANEL WITH GFR (Completed)   Alkaline Phosphatase Isoenzymes (Completed)    Other Visit Diagnoses   None.     Follow up plan: No Follow-up on file.  An after-visit summary was printed and given to the patient at [facility].  Please see the patient instructions which may contain other information and recommendations beyond what is mentioned above in the assessment and plan.  Meds ordered this encounter  Medications  . insulin NPH Human (HUMULIN N,NOVOLIN N) 100 UNIT/ML injection    Sig: Per endo: 75 units in am, 50 units at lunch and 75 with supper; subscutaneous    Dispense:  60 mL    Refill:  1    This is per endo suggestion  . insulin lispro (HUMALOG) 100 UNIT/ML injection    Sig: Per endo: Sliding scale goes up by 5 units every 50; with meals 70-150=30units 151-200=35units 201-250=40units 251-300=45units 301-350=50units 351-400=55units 401-450=60units    Dispense:  60 mL    Refill:  0  . Icosapent Ethyl (VASCEPA) 1 g CAPS    Sig: Take 2 capsules by mouth 2 (two) times daily. For cholesterol; stop fenofibrate    Dispense:  120 capsule    Refill:  11    Has TG > 2000 on fenofibrate; cancel fenofibrate and start statin plus vascepa  . atorvastatin (LIPITOR) 40 MG tablet    Sig: Take 1 tablet (40 mg total) by mouth at bedtime.    Dispense:  30 tablet    Refill:  1     Orders Placed This Encounter  Procedures  . Urine culture  . Magnesium  . Vitamin B12  . VITAMIN D 25 Hydroxy (Vit-D Deficiency, Fractures)  . Urinalysis w microscopic + reflex cultur  . COMPLETE METABOLIC PANEL WITH GFR  . Alkaline Phosphatase Isoenzymes  . Ambulatory referral to Endocrinology

## 2016-05-01 NOTE — Assessment & Plan Note (Signed)
Recheck urine and culture today; stay hydrated

## 2016-05-02 LAB — URINALYSIS W MICROSCOPIC + REFLEX CULTURE
Bilirubin Urine: NEGATIVE
CASTS: NONE SEEN [LPF]
Crystals: NONE SEEN [HPF]
HGB URINE DIPSTICK: NEGATIVE
LEUKOCYTES UA: NEGATIVE
NITRITE: NEGATIVE
PROTEIN: NEGATIVE
Specific Gravity, Urine: 1.041 — ABNORMAL HIGH (ref 1.001–1.035)
pH: 5.5 (ref 5.0–8.0)

## 2016-05-02 LAB — VITAMIN D 25 HYDROXY (VIT D DEFICIENCY, FRACTURES): VIT D 25 HYDROXY: 12 ng/mL — AB (ref 30–100)

## 2016-05-03 ENCOUNTER — Telehealth: Payer: Self-pay

## 2016-05-03 ENCOUNTER — Encounter: Payer: Self-pay | Admitting: Family Medicine

## 2016-05-03 ENCOUNTER — Other Ambulatory Visit: Payer: Self-pay | Admitting: Family Medicine

## 2016-05-03 MED ORDER — VITAMIN D (ERGOCALCIFEROL) 1.25 MG (50000 UNIT) PO CAPS: 50000.0000 [IU] | ORAL_CAPSULE | ORAL | 1 refills | Status: AC

## 2016-05-03 MED ORDER — FLUCONAZOLE 150 MG PO TABS: 150.0000 mg | ORAL_TABLET | Freq: Once | ORAL | 0 refills | Status: AC

## 2016-05-03 NOTE — Telephone Encounter (Signed)
Pt called please review labs

## 2016-05-03 NOTE — Progress Notes (Signed)
rx for vit D and diflucan

## 2016-05-04 ENCOUNTER — Other Ambulatory Visit: Payer: Self-pay

## 2016-05-04 ENCOUNTER — Other Ambulatory Visit: Payer: Self-pay | Admitting: Family Medicine

## 2016-05-04 ENCOUNTER — Encounter: Payer: Self-pay | Admitting: Emergency Medicine

## 2016-05-04 ENCOUNTER — Emergency Department: Payer: Medicare Other

## 2016-05-04 ENCOUNTER — Emergency Department
Admission: EM | Admit: 2016-05-04 | Discharge: 2016-05-04 | Disposition: A | Discharge status: Home / Self Care | Payer: Medicare Other | Attending: Emergency Medicine | Admitting: Emergency Medicine

## 2016-05-04 DIAGNOSIS — Z79899 Other long term (current) drug therapy: Secondary | ICD-10-CM | POA: Diagnosis not present

## 2016-05-04 DIAGNOSIS — R110 Nausea: Secondary | ICD-10-CM | POA: Diagnosis not present

## 2016-05-04 DIAGNOSIS — R1012 Left upper quadrant pain: Secondary | ICD-10-CM | POA: Diagnosis not present

## 2016-05-04 DIAGNOSIS — R109 Unspecified abdominal pain: Secondary | ICD-10-CM | POA: Insufficient documentation

## 2016-05-04 DIAGNOSIS — E1122 Type 2 diabetes mellitus with diabetic chronic kidney disease: Secondary | ICD-10-CM | POA: Diagnosis not present

## 2016-05-04 DIAGNOSIS — R1032 Left lower quadrant pain: Secondary | ICD-10-CM | POA: Diagnosis not present

## 2016-05-04 DIAGNOSIS — I130 Hypertensive heart and chronic kidney disease with heart failure and stage 1 through stage 4 chronic kidney disease, or unspecified chronic kidney disease: Secondary | ICD-10-CM | POA: Diagnosis not present

## 2016-05-04 DIAGNOSIS — I509 Heart failure, unspecified: Secondary | ICD-10-CM | POA: Insufficient documentation

## 2016-05-04 DIAGNOSIS — Z794 Long term (current) use of insulin: Secondary | ICD-10-CM | POA: Insufficient documentation

## 2016-05-04 DIAGNOSIS — E039 Hypothyroidism, unspecified: Secondary | ICD-10-CM | POA: Insufficient documentation

## 2016-05-04 DIAGNOSIS — J45909 Unspecified asthma, uncomplicated: Secondary | ICD-10-CM | POA: Diagnosis not present

## 2016-05-04 DIAGNOSIS — N183 Chronic kidney disease, stage 3 (moderate): Secondary | ICD-10-CM | POA: Insufficient documentation

## 2016-05-04 DIAGNOSIS — J449 Chronic obstructive pulmonary disease, unspecified: Secondary | ICD-10-CM | POA: Insufficient documentation

## 2016-05-04 LAB — CBC
HEMATOCRIT: 38.8 % (ref 35.0–47.0)
HEMOGLOBIN: 13 g/dL (ref 12.0–16.0)
MCH: 28.7 pg (ref 26.0–34.0)
MCHC: 33.6 g/dL (ref 32.0–36.0)
MCV: 85.5 fL (ref 80.0–100.0)
Platelets: 231 10*3/uL (ref 150–440)
RBC: 4.54 MIL/uL (ref 3.80–5.20)
RDW: 14.3 % (ref 11.5–14.5)
WBC: 6.5 10*3/uL (ref 3.6–11.0)

## 2016-05-04 LAB — URINALYSIS COMPLETE WITH MICROSCOPIC (ARMC ONLY)
BACTERIA UA: NONE SEEN
Bilirubin Urine: NEGATIVE
Glucose, UA: >500 mg/dL — AB
HGB URINE DIPSTICK: NEGATIVE
NITRITE: NEGATIVE
PROTEIN: 30 mg/dL — AB
SPECIFIC GRAVITY, URINE: 1.025 (ref 1.005–1.030)
pH: 5 (ref 5.0–8.0)

## 2016-05-04 LAB — COMPREHENSIVE METABOLIC PANEL
ALT: 23 U/L (ref 14–54)
ANION GAP: 11 (ref 5–15)
AST: 33 U/L (ref 15–41)
Albumin: 3.7 g/dL (ref 3.5–5.0)
Alkaline Phosphatase: 117 U/L (ref 38–126)
BUN: 23 mg/dL — ABNORMAL HIGH (ref 6–20)
CHLORIDE: 104 mmol/L (ref 101–111)
CO2: 21 mmol/L — ABNORMAL LOW (ref 22–32)
CREATININE: 0.9 mg/dL (ref 0.44–1.00)
Calcium: 9.7 mg/dL (ref 8.9–10.3)
Glucose, Bld: 251 mg/dL — ABNORMAL HIGH (ref 65–99)
Potassium: 3.5 mmol/L (ref 3.5–5.1)
Sodium: 136 mmol/L (ref 135–145)
Total Bilirubin: 0.4 mg/dL (ref 0.3–1.2)
Total Protein: 6.4 g/dL — ABNORMAL LOW (ref 6.5–8.1)

## 2016-05-04 LAB — URINE CULTURE: Colony Count: >=100000

## 2016-05-04 LAB — TROPONIN I

## 2016-05-04 LAB — LIPASE, BLOOD: LIPASE: 24 U/L (ref 11–51)

## 2016-05-04 MED ORDER — ONDANSETRON 4 MG PO TBDP
4.0000 mg | ORAL_TABLET | Freq: Once | ORAL | Status: AC
  Administered 2016-05-04: 4 mg via ORAL
  Filled 2016-05-04: qty 1

## 2016-05-04 MED ORDER — MAGNESIUM OXIDE 400 MG PO TABS: 400.0000 mg | ORAL_TABLET | Freq: Three times a day (TID) | ORAL | 2 refills | Status: DC

## 2016-05-04 MED ORDER — KETOROLAC TROMETHAMINE 60 MG/2ML IM SOLN
60.0000 mg | Freq: Once | INTRAMUSCULAR | Status: AC
  Administered 2016-05-04: 60 mg via INTRAMUSCULAR
  Filled 2016-05-04: qty 2

## 2016-05-04 MED ORDER — NITROFURANTOIN MONOHYD MACRO 100 MG PO CAPS: 100.0000 mg | ORAL_CAPSULE | Freq: Two times a day (BID) | ORAL | 0 refills | Status: DC

## 2016-05-04 MED ORDER — NITROFURANTOIN MONOHYD MACRO 100 MG PO CAPS: 100.0000 mg | ORAL_CAPSULE | Freq: Two times a day (BID) | ORAL | 0 refills | Status: AC

## 2016-05-04 NOTE — Telephone Encounter (Signed)
Addressed in lab section; thank you

## 2016-05-04 NOTE — ED Notes (Signed)
Patient transported to CT 

## 2016-05-04 NOTE — ED Provider Notes (Signed)
[facility] Emergency Department Provider Note  Time seen: 2:52 PM  I have reviewed the triage vital signs and the nursing notes.   HISTORY  Chief Complaint Abdominal Pain    HPI [name] is a 50 y.o. female with a past medical history of bipolar, CHF, COPD, diabetes, hypertension, hyperlipidemia who presents the emergency department with left-sided abdominal pain. According to the patient for the past 3 days she has been experiencing left abdominal pain, nausea, vomiting and occasional episodes of diarrhea. Denies any fever, dysuria. Patient has chronic abdominal pain but is usually right sided likely due to fatty liver disease, patient states this is a different type of pain. Describes the pain as sharp, moderate in severity, better if she pushes on the area.  Past Medical History:  Diagnosis Date  . Acute renal failure ([place])   . Asthma   . Bipolar depression ([place])   . CHF (congestive heart failure) ([place])   . Chronic headaches   . COPD (chronic obstructive pulmonary disease) ([place])   . Diabetes mellitus without complication (HCC)    Type 2, insulin dependent. Patient also takes Metformin.  . Diabetic neuropathy ([place])   . GERD (gastroesophageal reflux disease)   . Headache   . Heart attack ([place])   . High blood pressure   . Hyperlipidemia   . Hypertension   . Hypomagnesemia   . Hypothyroid   . Irregular heart rhythm   . Liver lesion    Favored to be benign on CT; MRI of abd w/contract in Aug 2016. Liver Biopsy-Negative, March 2016  . Morbid obesity ([place])   . OSA (obstructive sleep apnea)   . Pinched nerve    Back  . RLS (restless legs syndrome)   . Seasonal allergies   . Sleep apnea   . Urinary incontinence   . Vitamin D deficiency     Patient Active Problem List   Diagnosis Date Noted  . Fatigue 05/01/2016  . Urinary tract infection 03/14/2016  . Adenomatous colon polyp 10/15/2015  . Airway hyperreactivity 10/15/2015  . Bipolar  affective disorder ([place]) 10/15/2015  . CN (constipation) 10/15/2015  . CAFL (chronic airflow limitation) ([place]) 10/15/2015  . Drug-induced hepatic toxicity 10/15/2015  . Blood in feces 10/15/2015  . HLD (hyperlipidemia) 10/15/2015  . Hyperthyroidism 10/15/2015  . Hypoxemia 10/15/2015  . NASH (nonalcoholic steatohepatitis) 10/15/2015  . Adiposity 10/15/2015  . Restless leg syndrome 10/15/2015  . Type 2 diabetes mellitus ([place]) 10/15/2015  . Severe obstructive sleep apnea 10/15/2015  . Elevated alkaline phosphatase level 09/22/2015  . Urinary bladder incontinence 09/22/2015  . Diarrhea 08/18/2015  . Low back pain with left-sided sciatica 08/18/2015  . Encounter for monitoring tricyclic antidepressant therapy 05/13/2015  . Bipolar disorder ([place]) 05/11/2015  . Liver disease 05/11/2015  . Diabetes ([place]) 05/11/2015  . Apnea, sleep 05/05/2015  . Heart valve disease 05/05/2015  . Chronic kidney disease, stage III (moderate) 03/15/2015  . Migraine 03/12/2015  . COPD (chronic obstructive pulmonary disease) ([place]) 01/20/2015  . 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 GERD (gastroesophageal reflux disease) 01/20/2015  . Disorder of smooth muscle 01/20/2015  . Hepatic encephalopathy ([place]) 01/20/2015  . Hypothyroid   . Vitamin D deficiency   . Urinary incontinence   . Liver lesion   . Hypomagnesemia   . Obstructive sleep apnea 10/20/2013  . PERIPHERAL NEUROPATHY 03/04/2009  . HEMORRHOIDS, EXTERNAL 09/03/2008  . ALLERGIC RHINITIS 07/04/2007  . ASTHMA, PERSISTENT, MODERATE 07/04/2007  . INTERSTITIAL CYSTITIS 07/04/2007  .  HYPERTRIGLYCERIDEMIA 07/03/2007  . FATIGUE, CHRONIC 07/03/2007  . HYPERTENSION, BENIGN ESSENTIAL 06/19/2007    Past Surgical History:  Procedure Laterality Date  . ABDOMINAL HYSTERECTOMY    . ABDOMINAL HYSTERECTOMY     Partial  . BLADDER SURGERY     x 2  . CHOLECYSTECTOMY    . LIVER BIOPSY  March 2016   Negative  . LUNG BIOPSY    . TUBAL LIGATION       Prior to Admission medications   Medication Sig Start Date End Date Taking? Authorizing Provider  ACCU-CHEK AVIVA PLUS test strip  07/26/15   Historical Provider, MD  ACCU-CHEK SOFTCLIX LANCETS lancets  07/26/15   Historical Provider, MD  albuterol (PROVENTIL HFA;VENTOLIN HFA) 108 (90 Base) MCG/ACT inhaler Inhale 2 puffs into the lungs every 4 (four) hours as needed for wheezing or shortness of breath. 11/19/15   Arnetha Courser, MD  amLODipine (NORVASC) 2.5 MG tablet Take 1 tablet (2.5 mg total) by mouth daily. 01/21/16   Arnetha Courser, MD  atorvastatin (LIPITOR) 40 MG tablet Take 1 tablet (40 mg total) by mouth at bedtime. 05/01/16   Arnetha Courser, MD  cyclobenzaprine (FLEXERIL) 10 MG tablet Take 1 tablet (10 mg total) by mouth every 8 (eight) hours as needed for muscle spasms. 03/14/16   Arnetha Courser, MD  DULoxetine (CYMBALTA) 60 MG capsule Take 120 mg by mouth daily.    Historical Provider, MD  fluticasone (FLONASE) 50 MCG/ACT nasal spray Place 2 sprays into both nostrils daily. 08/03/15   Arnetha Courser, MD  Icosapent Ethyl (VASCEPA) 1 g CAPS Take 2 capsules by mouth 2 (two) times daily. For cholesterol; stop fenofibrate 05/01/16   Arnetha Courser, MD  insulin lispro (HUMALOG) 100 UNIT/ML injection Per endo: Sliding scale goes up by 5 units every 50; with meals 70-150=30units 151-200=35units 201-250=40units 251-300=45units 301-350=50units 351-400=55units 401-450=60units 05/01/16   Arnetha Courser, MD  insulin NPH Human (HUMULIN N,NOVOLIN N) 100 UNIT/ML injection Per endo: 75 units in am, 50 units at lunch and 75 with supper; subscutaneous 05/01/16   Arnetha Courser, MD  Insulin Syringe-Needle U-100 (ULTICARE INSULIN SYRINGE) 31G X 1/4" 0.3 ML MISC 1 each by Does not apply route 5 (five) times daily as needed. 01/25/16   Arnetha Courser, MD  lamoTRIgine (LAMICTAL) 150 MG tablet Take 150 mg by mouth daily.  03/22/15   Historical Provider, MD  levothyroxine (SYNTHROID, LEVOTHROID) 75 MCG tablet  Take 1 tablet (75 mcg total) by mouth daily. 03/22/16   Arnetha Courser, MD  LINZESS 145 MCG CAPS capsule Take 145 mcg by mouth daily.  09/30/15   Historical Provider, MD  magnesium oxide (MAG-OX) 400 MG tablet Take 1 tablet (400 mg total) by mouth 3 (three) times daily. 05/04/16   Arnetha Courser, MD  metoprolol tartrate (LOPRESSOR) 25 MG tablet Take 0.5 tablets (12.5 mg total) by mouth 2 (two) times daily. 11/04/15   Arnetha Courser, MD  QUEtiapine (SEROQUEL) 25 MG tablet Take 25 mg by mouth every morning. Take one in am, one at lunch    Historical Provider, MD  QUEtiapine (SEROQUEL) 400 MG tablet Take 800 mg by mouth at bedtime. Take 2 tabs at bedtime    Historical Provider, MD  ranitidine (ZANTAC) 300 MG tablet Take 1 tablet (300 mg total) by mouth at bedtime. 11/19/15   Arnetha Courser, MD  REXULTI 1 MG TABS Take 1 mg by mouth daily.  07/26/15   Historical Provider, MD  Vitamin D, Ergocalciferol, (DRISDOL) 50000 units CAPS capsule Take 1 capsule (50,000 Units total) by mouth every 7 (seven) days. 05/03/16 06/28/16  Arnetha Courser, MD  zolpidem (AMBIEN) 10 MG tablet Take 10 mg by mouth at bedtime.  07/26/15   Historical Provider, MD    Allergies  Allergen Reactions  . Morphine And Related     anaphylaxis  . Codeine     REACTION: itch, rash  . Codeine     itching  . Hydrocodone-Acetaminophen     REACTION: rash  . Mirtazapine     REACTION: closes throat  . Morphine And Related   . Sulfamethoxazole-Trimethoprim     REACTION: [place] all over  . Vicodin [Hydrocodone-Acetaminophen]     itching    Family History  Problem Relation Age of Onset  . Diabetes Mother   . Heart disease Mother   . Hyperlipidemia Mother   . Hypertension Mother   . Kidney disease Mother   . Mental illness Mother   . Hypothyroidism Mother   . Stroke Mother   . Osteoporosis Mother   . Glaucoma Mother   . Congestive Heart Failure Mother   . Hypertension Father   . Asthma Daughter   . Cancer Daughter     throat   . Heart disease Maternal Grandfather   . Rheum arthritis Maternal Grandfather   . Cancer Maternal Grandfather     liver  . Cancer Paternal Grandmother     lung    Social History Social History  Substance Use Topics  . Smoking status: Never Smoker  . Smokeless tobacco: Never Used  . Alcohol use No    Review of Systems Constitutional: Negative for fever Cardiovascular: Negative for chest pain. Respiratory: Negative for shortness of breath. Gastrointestinal: Negative for abdominal pain Genitourinary: Negative for dysuria. Musculoskeletal: Negative for back pain. Skin: Negative for rash. Neurological: Negative for headache 10-point ROS otherwise negative.  ____________________________________________   PHYSICAL EXAM:  VITAL SIGNS: ED Triage Vitals  Enc Vitals Group     BP 05/04/16 1144 137/71     Pulse Rate 05/04/16 1144 95     Resp 05/04/16 1144 18     Temp 05/04/16 1144 98.8 F (37.1 C)     Temp Source 05/04/16 1144 Oral     SpO2 05/04/16 1144 97 %     Weight 05/04/16 1144 186 lb (84.4 kg)     Height 05/04/16 1144 4' 11"  (1.499 m)     Head Circumference --      Peak Flow --      Pain Score 05/04/16 1147 8     Pain Loc --      Pain Edu? --      Excl. in [place]? --     Constitutional: Alert and oriented. Well appearing and in no distress. Eyes: Normal exam ENT   Head: Normocephalic and atraumatic.   Mouth/Throat: Mucous membranes are moist. Cardiovascular: Normal rate, regular rhythm. No murmur Respiratory: Normal respiratory effort without tachypnea nor retractions. Breath sounds are clear Gastrointestinal: Soft, mild left-sided abdominal tenderness to palpation, no rebound or guarding. No distention. Musculoskeletal: Nontender with normal range of motion in all extremities. Neurologic:  Normal speech and language. No gross focal neurologic deficits  Skin:  Skin is warm, dry and intact.  Psychiatric: Mood and affect are normal.    ____________________________________________    EKG  EKG reviewed and interpreted by myself shows normal sinus rhythm at 92 bpm, narrow QRS, left axis deviation, nonspecific ST changes without  ST elevation. Normal intervals.  ____________________________________________    RADIOLOGY  CT negative  ____________________________________________   INITIAL IMPRESSION / ASSESSMENT AND PLAN / ED COURSE  Pertinent labs & imaging results that were available during my care of the patient were reviewed by me and considered in my medical decision making (see chart for details).  Patient presents with left-sided abdominal pain for the past 3 days. Patient also states nausea, vomiting, diarrhea. Patient's lab work is largely within normal limits, mild tenderness to palpation on exam. We will obtain a renal CT, and treat with Toradol IM. Overall the patient appears very well, no distress.  CT negative. Patient states pain is much improved after Toradol. We will discharge home with PCP/GI follow-up. Patient has already called GI.  ____________________________________________   FINAL CLINICAL IMPRESSION(S) / ED DIAGNOSES  Left flank pain    Harvest Dark, MD 05/04/16 [phone]

## 2016-05-04 NOTE — ED Notes (Signed)
MD at bedside. 

## 2016-05-04 NOTE — Progress Notes (Signed)
Rx to [facility] if she can get there tonight; back-up Rx to [place], also to [facility] in [place]

## 2016-05-04 NOTE — ED Triage Notes (Addendum)
Pt c/o LUQ pain that radiates to back. Has had nausea and diarrhea. Denies SHOB or fevers. Pt reports recent blood work at PCP resulted cholesterol over 2000

## 2016-05-10 ENCOUNTER — Other Ambulatory Visit: Payer: Self-pay

## 2016-05-10 LAB — ALKALINE PHOSPHATASE ISOENZYMES
Alkaline Phonsphatase: 135 U/L — ABNORMAL HIGH (ref 33–130)
Bone Isoenzymes: 65 % (ref 28–66)
INTESTINAL ISOENZYMES (ALP ISO): 2 % (ref 1–24)
Liver Isoenzymes: 33 % (ref 25–69)

## 2016-05-10 MED ORDER — ACCU-CHEK AVIVA PLUS VI STRP: ORAL_STRIP | 3 refills | Status: DC

## 2016-05-10 MED ORDER — ACCU-CHEK SOFTCLIX LANCETS MISC: 3 refills | Status: DC

## 2016-05-10 NOTE — Telephone Encounter (Signed)
approved

## 2016-05-15 ENCOUNTER — Other Ambulatory Visit: Payer: Self-pay

## 2016-05-15 MED ORDER — RANITIDINE HCL 300 MG PO TABS: 300.0000 mg | ORAL_TABLET | Freq: Every day | ORAL | 5 refills | Status: DC

## 2016-05-16 ENCOUNTER — Telehealth: Payer: Self-pay

## 2016-05-16 ENCOUNTER — Other Ambulatory Visit: Payer: Self-pay

## 2016-05-16 IMAGING — CR DG ABDOMEN 3V
1 series · 5 of 5 positions shown · non-contrast
Comparison: Chest radiograph 08/31/14.

CLINICAL DATA: Initial encounter for constipation. Nausea and
abdominal pain for 3 days.

EXAM:
ABDOMEN SERIES

[Series 1: erect ap · 0.17mm/px · 5 of 5 slices shown]
[im 1/5]
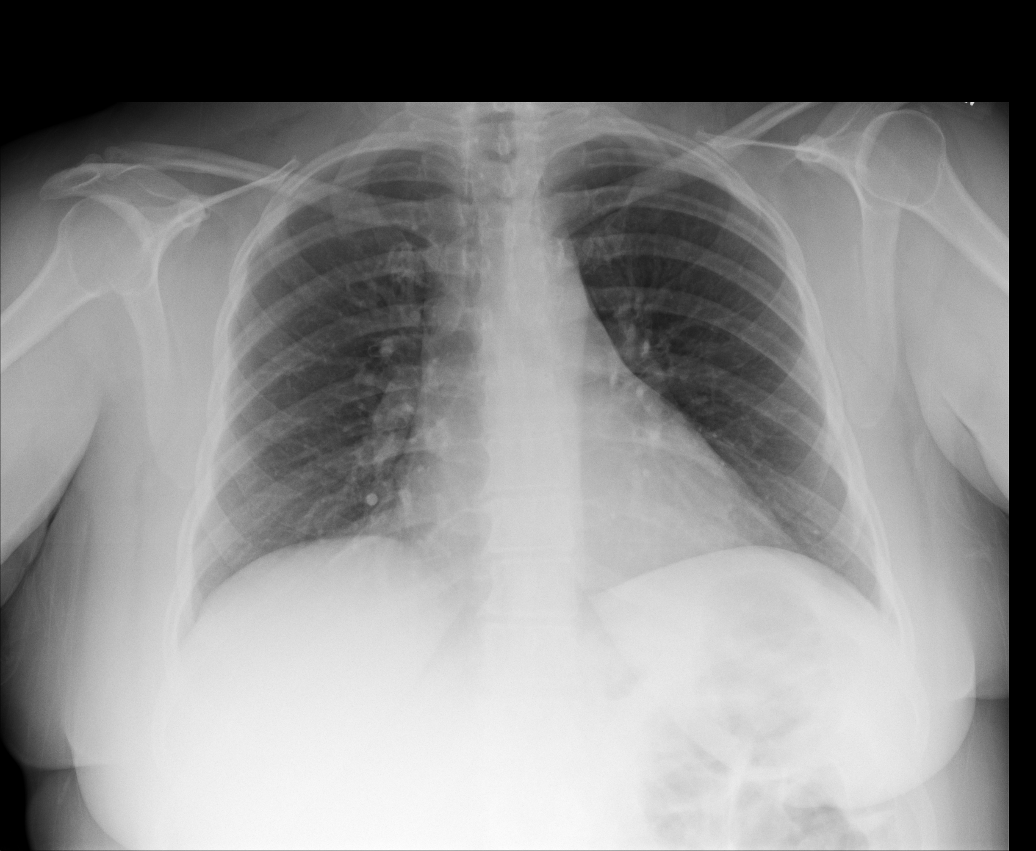
[im 2/5]
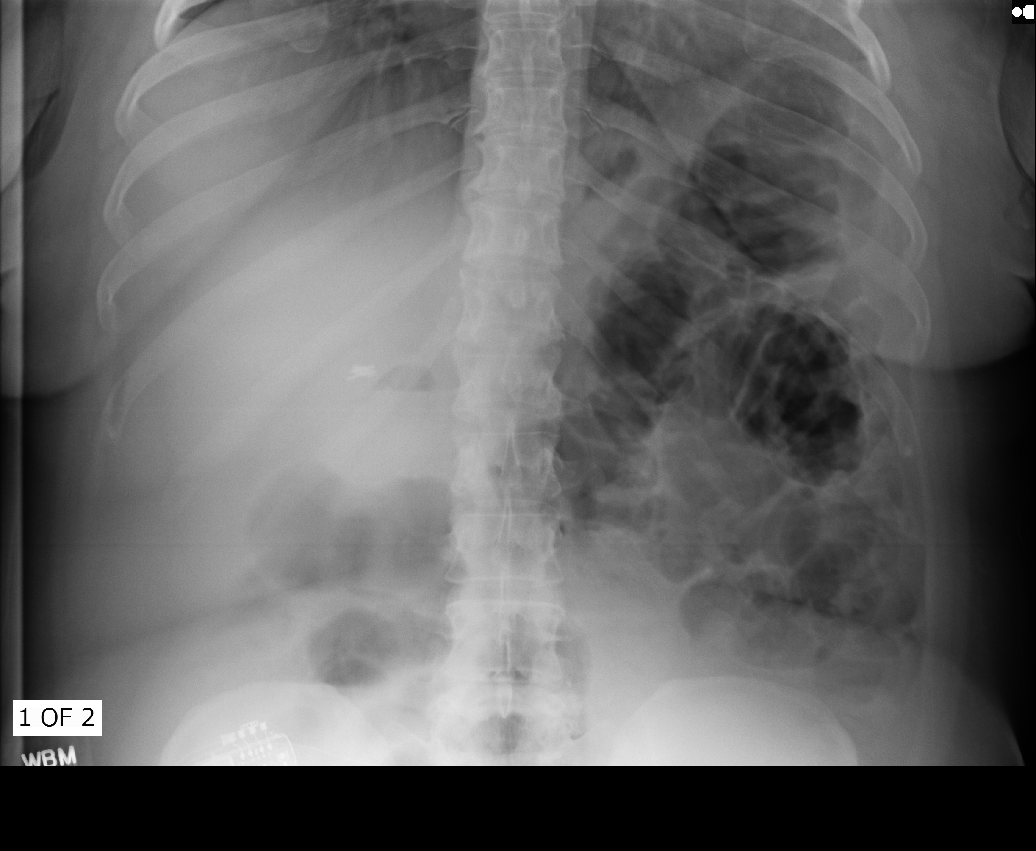
[im 3/5]
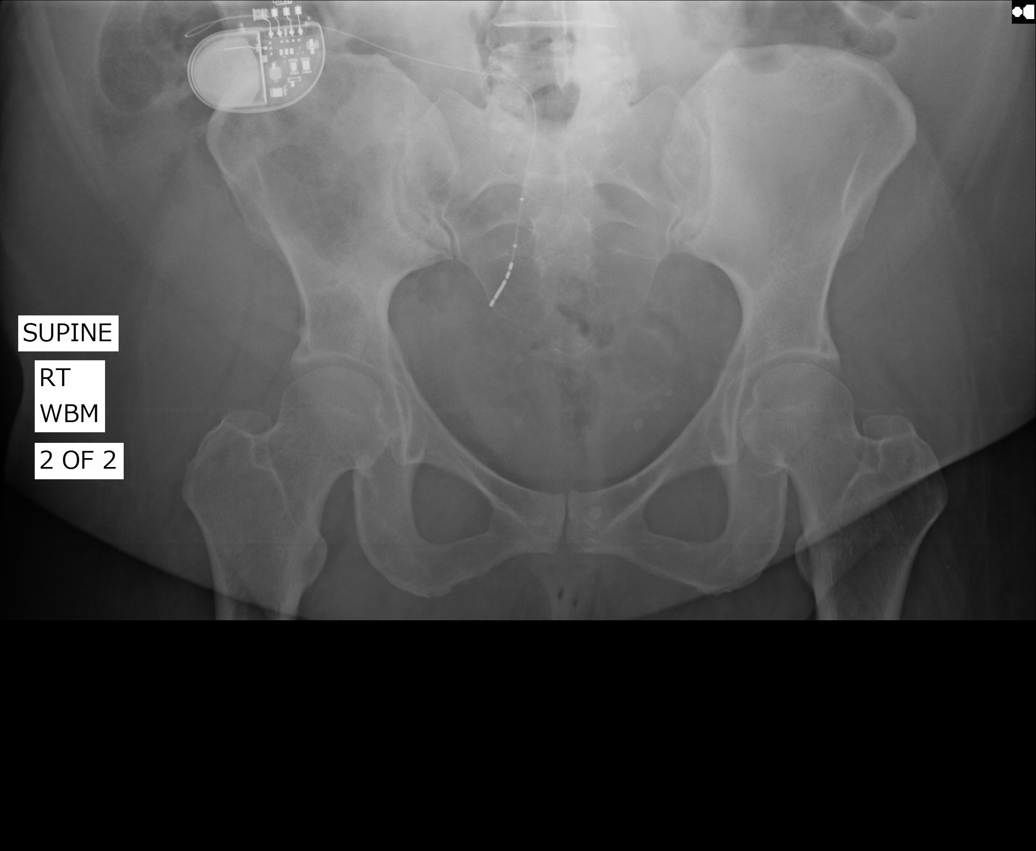
[im 4/5]
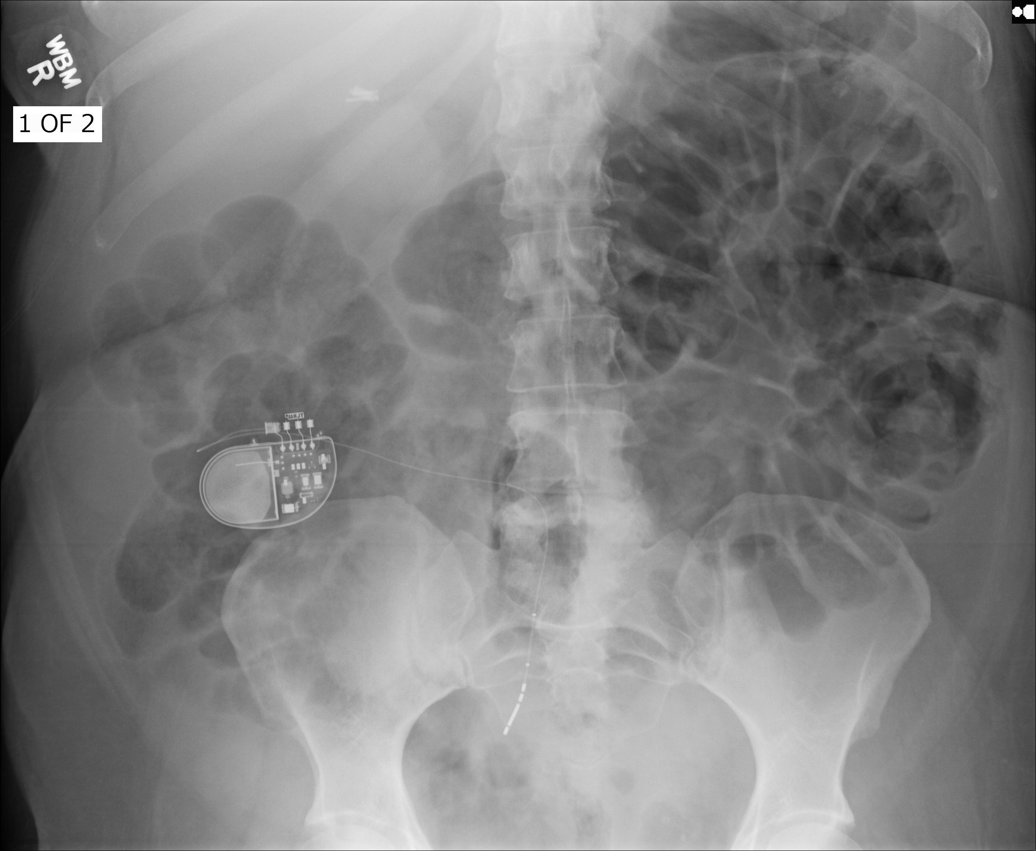
[im 5/5]
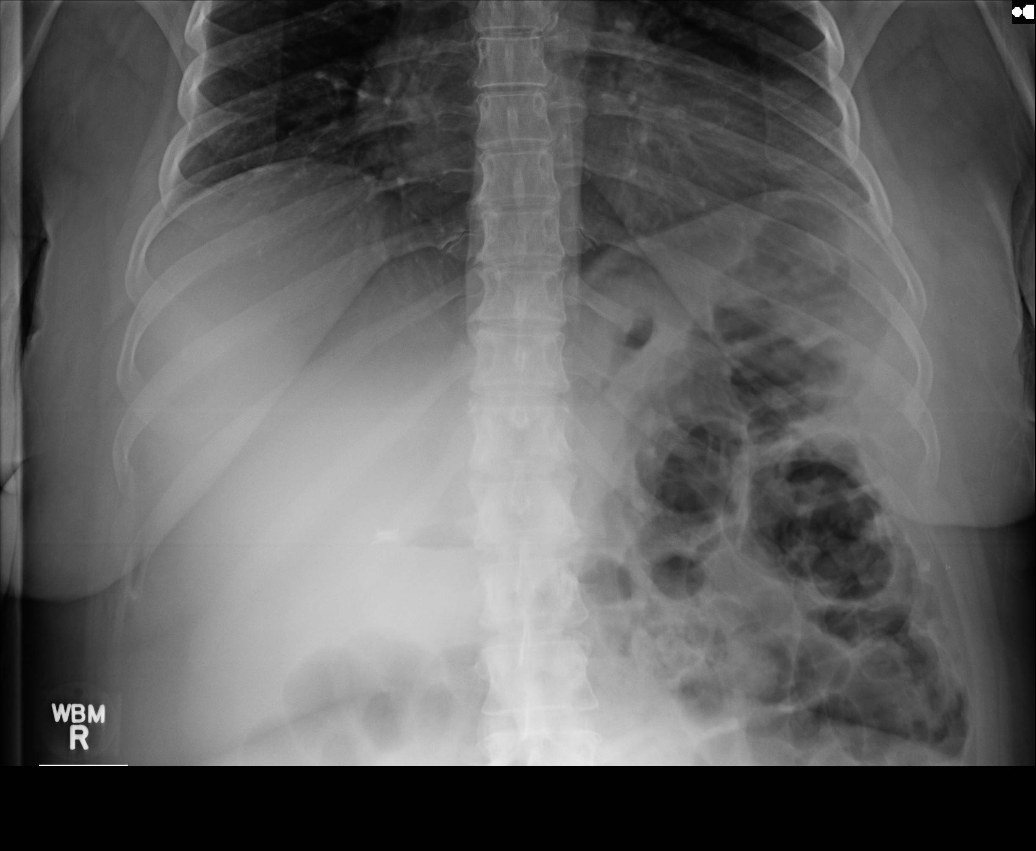

[5 of 5 positions shown; findings below may reference images not displayed]

FINDINGS: Frontal view of the chest demonstrates midline trachea. Normal heart
size and mediastinal contours. No pleural effusion or pneumothorax.
Clear lungs.

Abdominal films demonstrate no free intraperitoneal air or
significant air-fluid levels on upright positioning. Cholecystectomy
clips.

Gas within prominent large bowel on supine imaging. No small bowel
distension. Presacral stimulator is incidentally noted. Probable
phleboliths in the pelvis. Moderate amount of stool within the
right-sided colon.
IMPRESSION: No acute findings.

Possible constipation.

## 2016-05-16 NOTE — Telephone Encounter (Signed)
Rn Investment banker, corporate at [phone]. Rn stated to [name] that pt was last here 03/2015. Pt has no follow up at [facility] with Dr. Leonie Man. Rn stated refill of toprimate will be denied.Brienna verbalized understanding.

## 2016-05-17 MED ORDER — METOPROLOL TARTRATE 25 MG PO TABS: 12.5000 mg | ORAL_TABLET | Freq: Two times a day (BID) | ORAL | 11 refills | Status: DC

## 2016-05-20 DIAGNOSIS — G4733 Obstructive sleep apnea (adult) (pediatric): Secondary | ICD-10-CM | POA: Diagnosis not present

## 2016-05-20 DIAGNOSIS — J449 Chronic obstructive pulmonary disease, unspecified: Secondary | ICD-10-CM | POA: Diagnosis not present

## 2016-05-20 DIAGNOSIS — R0902 Hypoxemia: Secondary | ICD-10-CM | POA: Diagnosis not present

## 2016-05-20 IMAGING — CT CT ABD-PELV W/ CM
2 of 5 series · 16 of 46 positions shown, 18 images · IV contrast (omnipaque)
Comparison: CT of the abdomen and pelvis performed 02/28/2014, and
renal ultrasound performed 05/28/2014

CLINICAL DATA: Acute onset of generalized abdominal pain and
nausea. Weakness. Tachycardia and low grade fever. Initial
encounter.

EXAM:
CT ABDOMEN AND PELVIS WITH CONTRAST
TECHNIQUE: Multidetector CT imaging of the abdomen and pelvis was performed
using the standard protocol following bolus administration of
intravenous contrast.
CONTRAST:  100 mL of Omnipaque 300 IV contrast

[Series 2: routine abd pel with · axial · 0.80mm/px · z∈[-751,-331]mm · 13 of 94 slices shown, 15 images]
[im 5/94  soft-tissue]
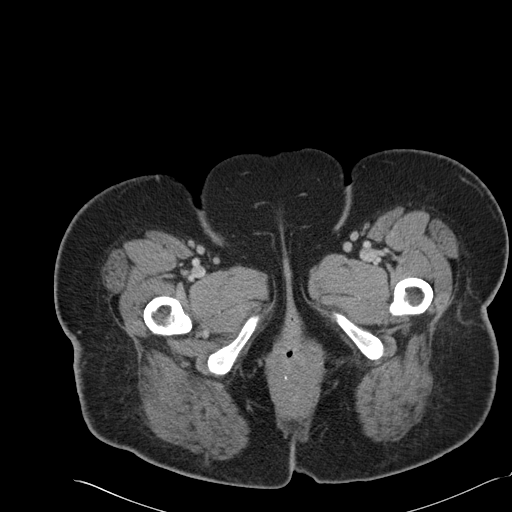
[im 5/94  bone]
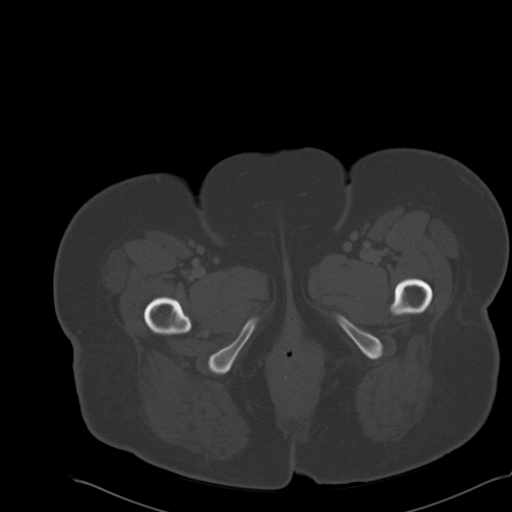
[im 15/94  soft-tissue]
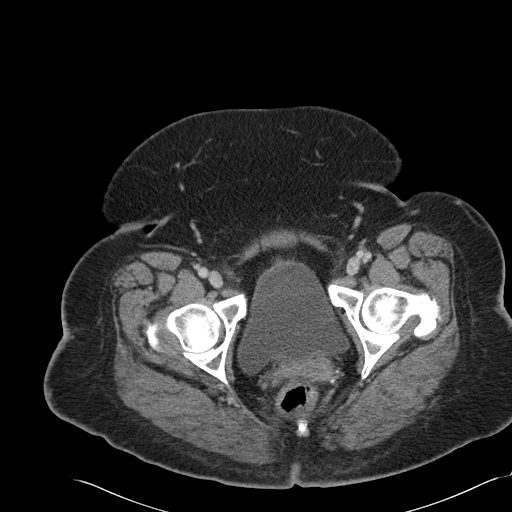
[im 20/94  soft-tissue]
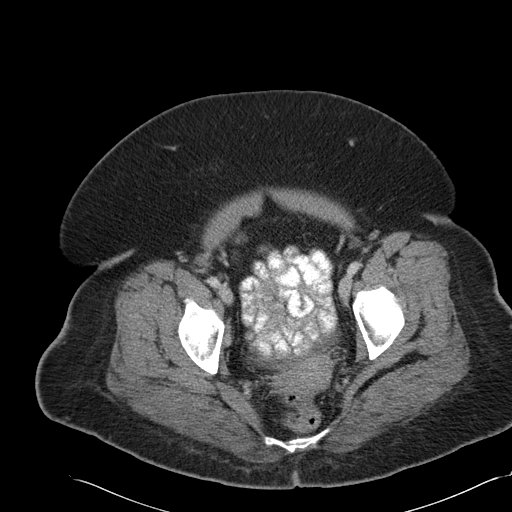
[im 25/94  soft-tissue]
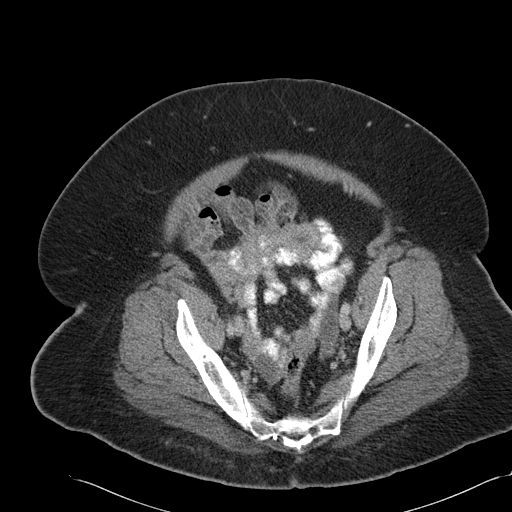
[im 35/94  soft-tissue]
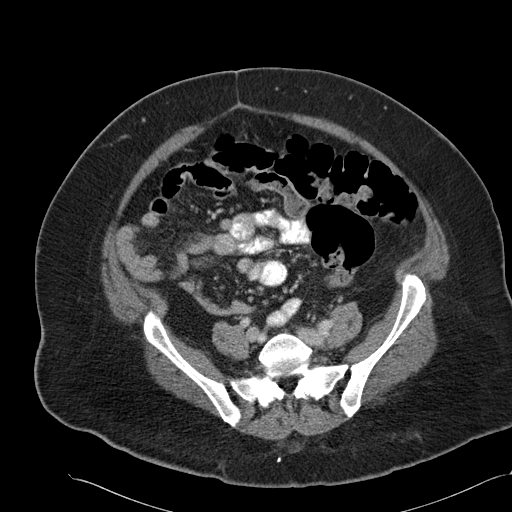
[im 40/94  soft-tissue]
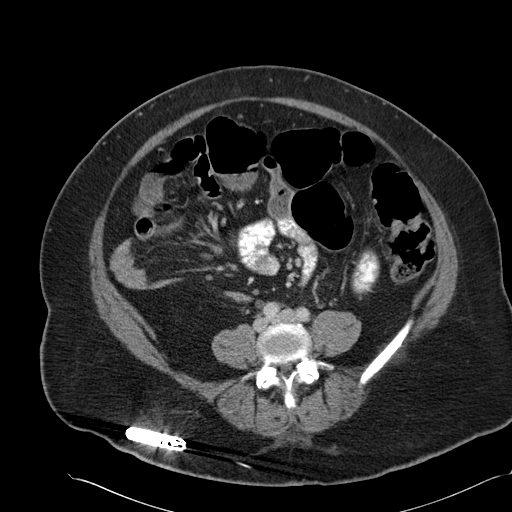
[im 49/94  soft-tissue]
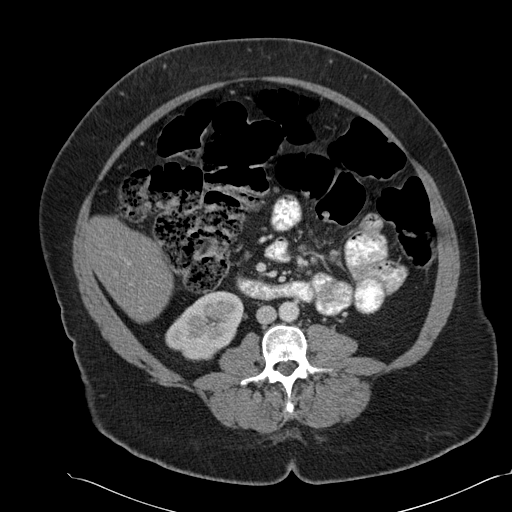
[im 54/94  soft-tissue]
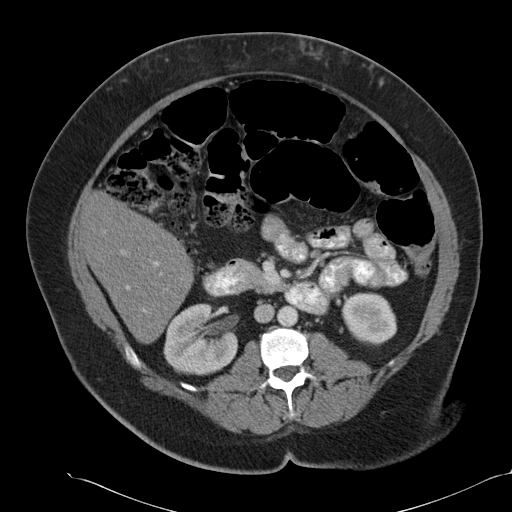
[im 59/94  soft-tissue]
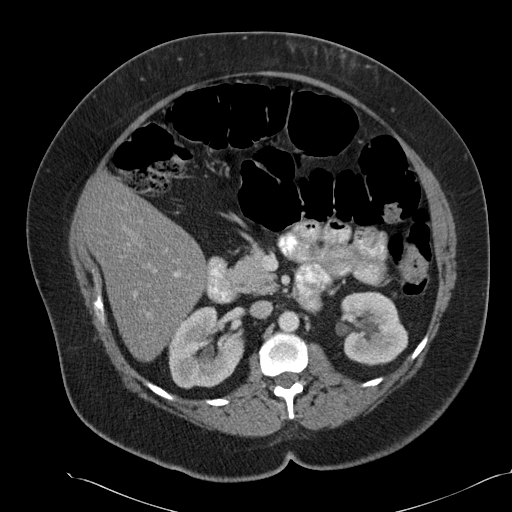
[im 59/94  bone]
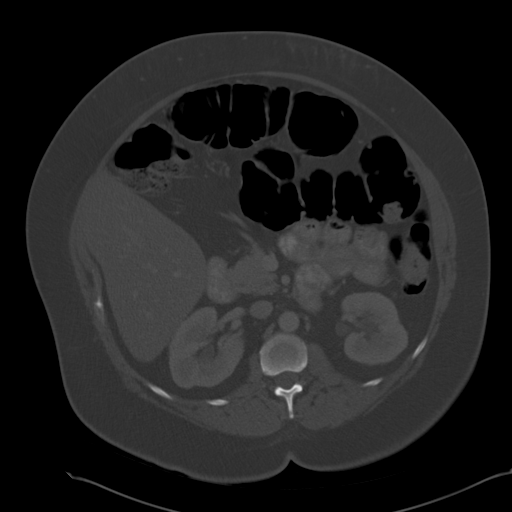
[im 69/94  soft-tissue]
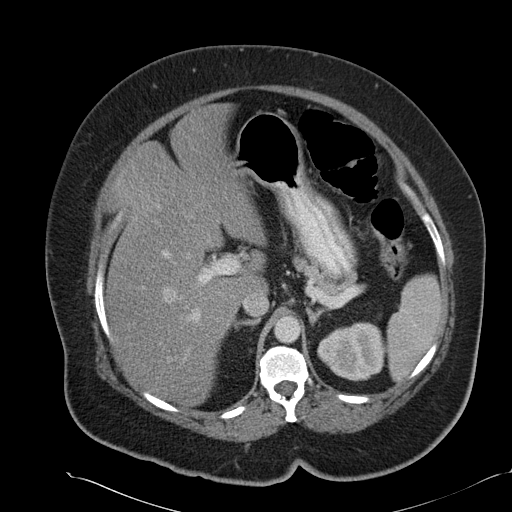
[im 74/94  soft-tissue]
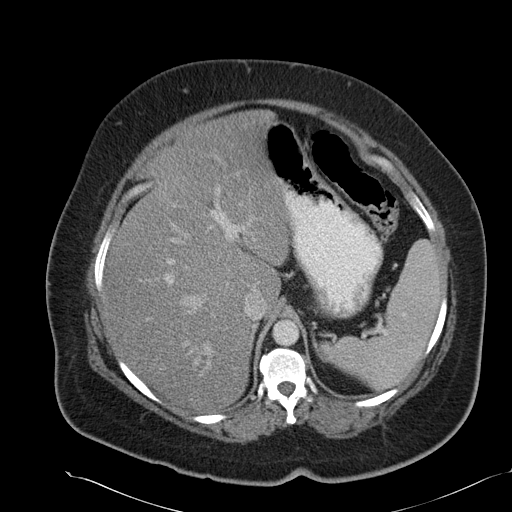
[im 79/94  soft-tissue]
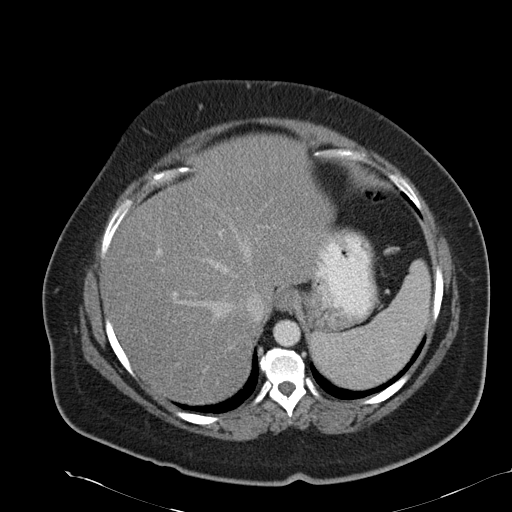
[im 89/94  soft-tissue]
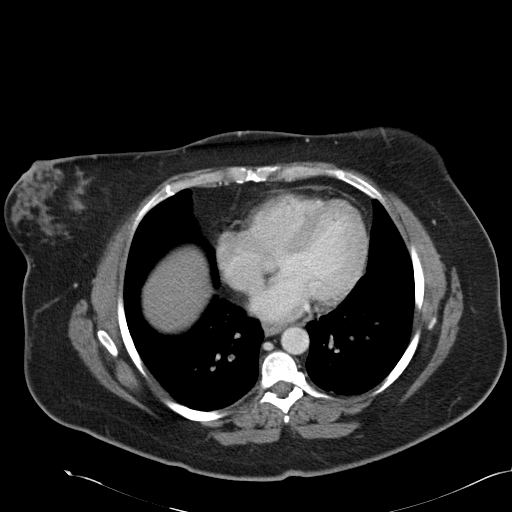

[Series 5: cor routine abd pel with · coronal · 0.95mm/px · 3 of 176 slices shown]
[im 59/176  soft-tissue]
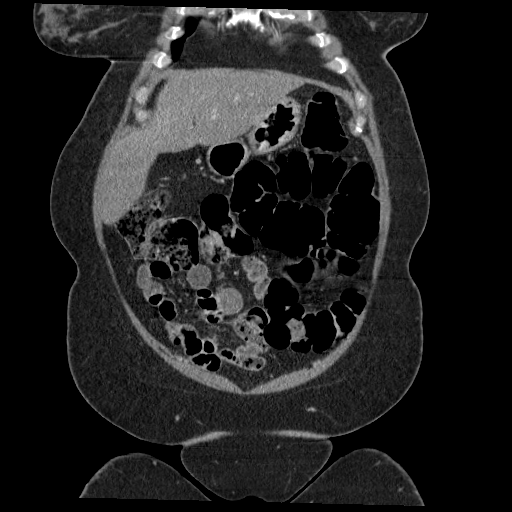
[im 78/176  soft-tissue]
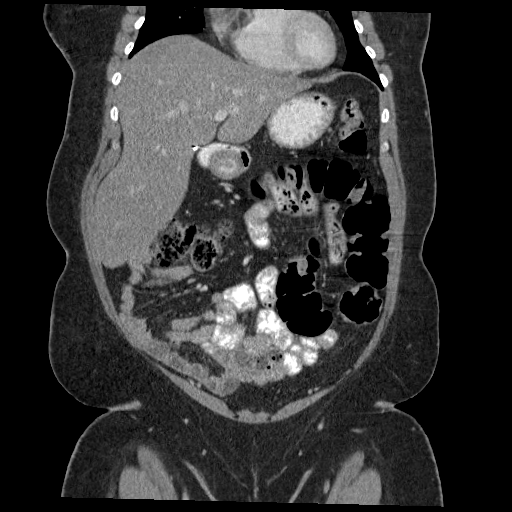
[im 98/176  soft-tissue]
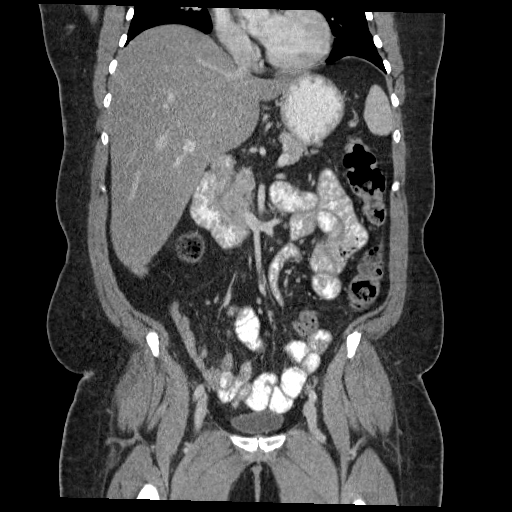

[16 of 46 positions shown; findings below may reference images not displayed]

FINDINGS: The visualized lung bases are clear.

The liver and spleen are unremarkable in appearance. The patient is
status post cholecystectomy, with clips noted at the gallbladder
fossa. The pancreas and adrenal glands are unremarkable.

The kidneys are unremarkable in appearance. There is no evidence of
hydronephrosis. No renal or ureteral stones are seen. No perinephric
stranding is appreciated.

No free fluid is identified. The small bowel is unremarkable in
appearance. The stomach is within normal limits. No acute vascular
abnormalities are seen.

Mildly prominent mesenteric nodes are seen, grossly stable from
prior studies and likely reflecting the patient's baseline.

The appendix is normal in caliber, without evidence for
appendicitis. The cecum is flipped anteriorly. The colon is largely
filled with air and minimal stool. The sigmoid colon is mildly
redundant. The colon is grossly unremarkable.

The bladder is mildly distended and grossly unremarkable. The uterus
is unremarkable in appearance. The ovaries are relatively symmetric.
No suspicious adnexal masses are seen. No inguinal lymphadenopathy
is seen.

A metallic device is noted at the right flank, with a lead extending
into the right hemipelvis.

No acute osseous abnormalities are identified. Mild facet disease is
noted at the lower lumbar spine.
IMPRESSION: No acute abnormality seen to explain the patient's symptoms.

## 2016-06-04 IMAGING — CR DG ABDOMEN 2V
1 series · 3 of 3 positions shown · non-contrast
Comparison: CT 10/17/2014

CLINICAL DATA: Constipation for 3 days.  Right flank pain.

EXAM:
ABDOMEN - 2 VIEW

[Series 1: dxr abdomen 2 v flat and erect · 0.14mm/px · 3 of 3 slices shown]
[im 1/3]
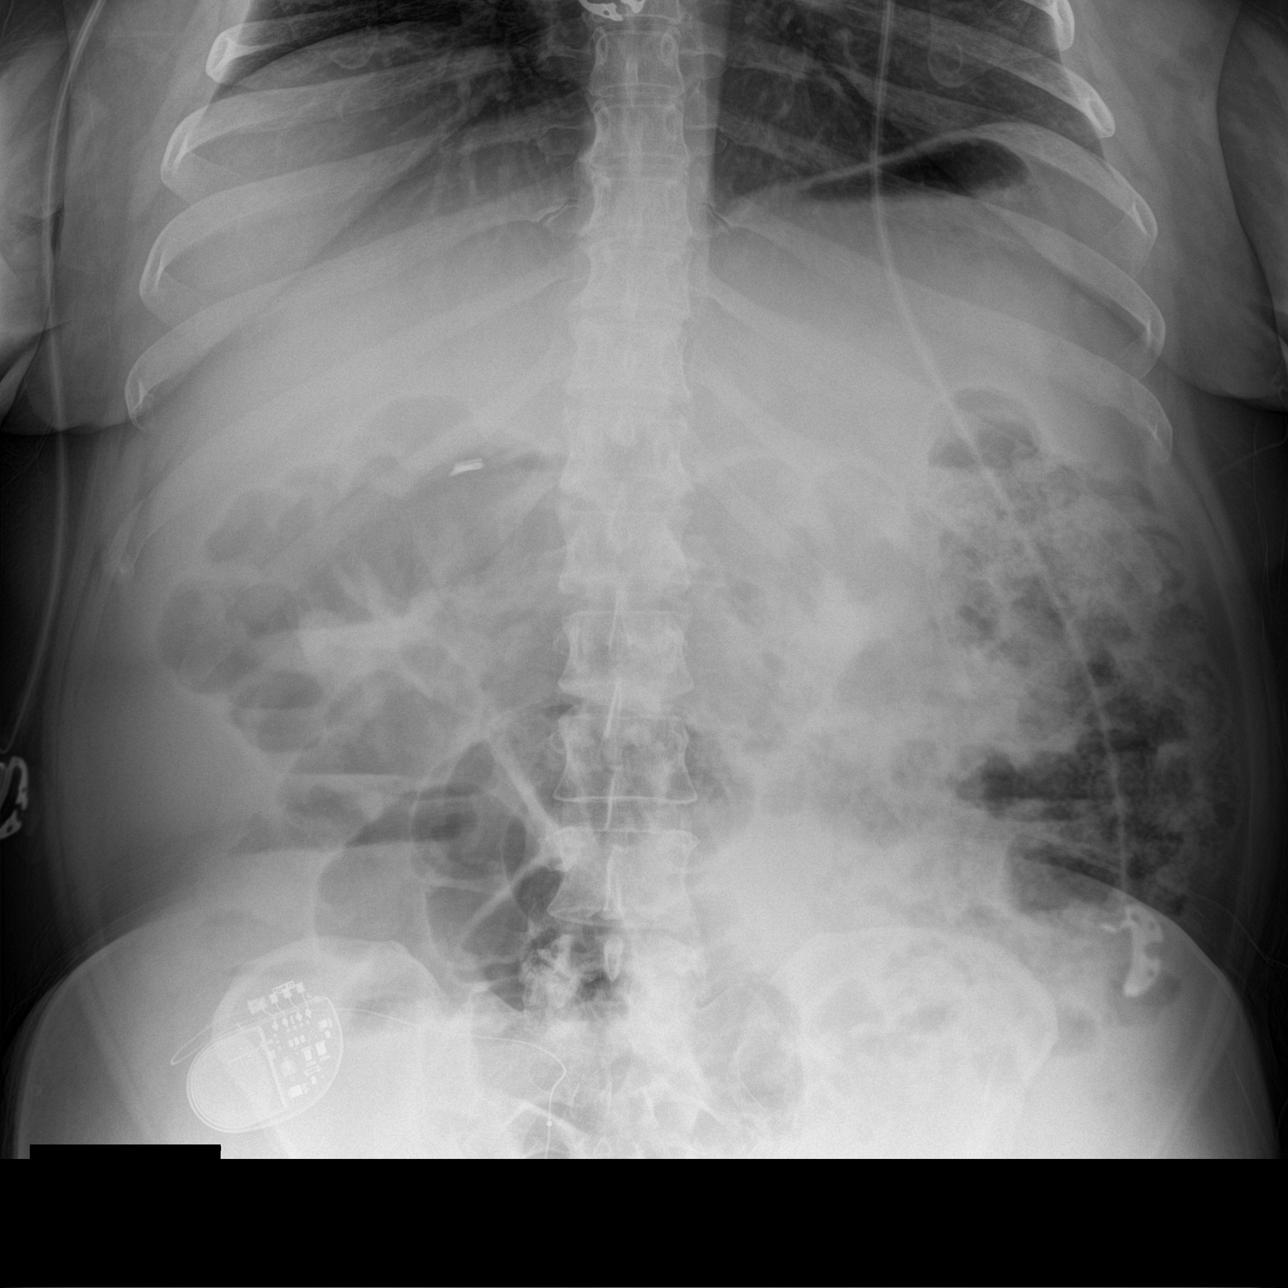
[im 2/3]
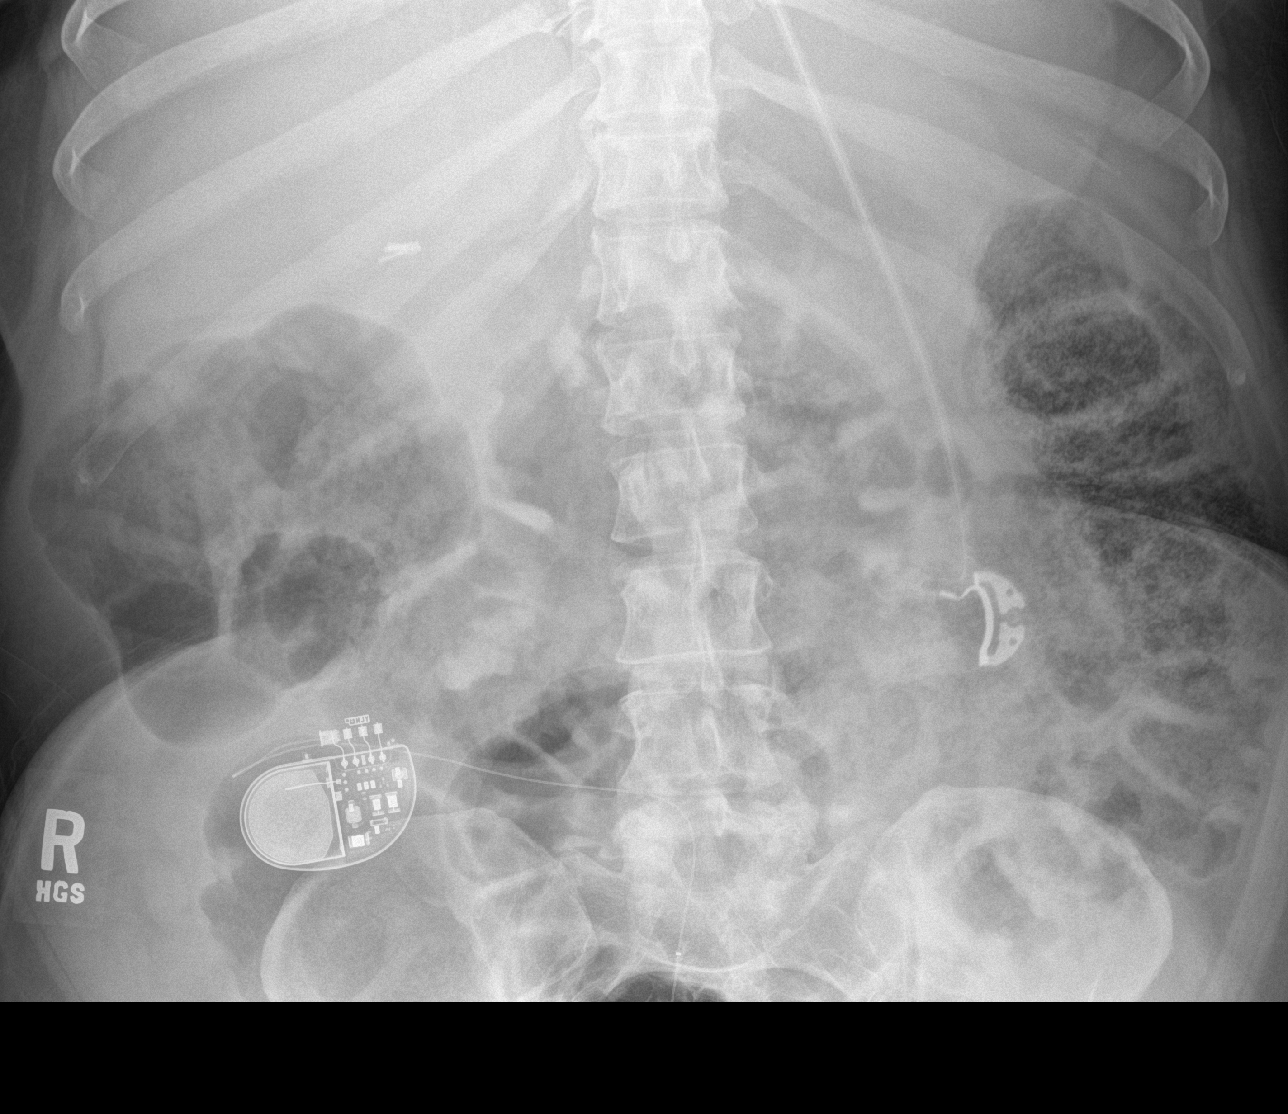
[im 3/3]
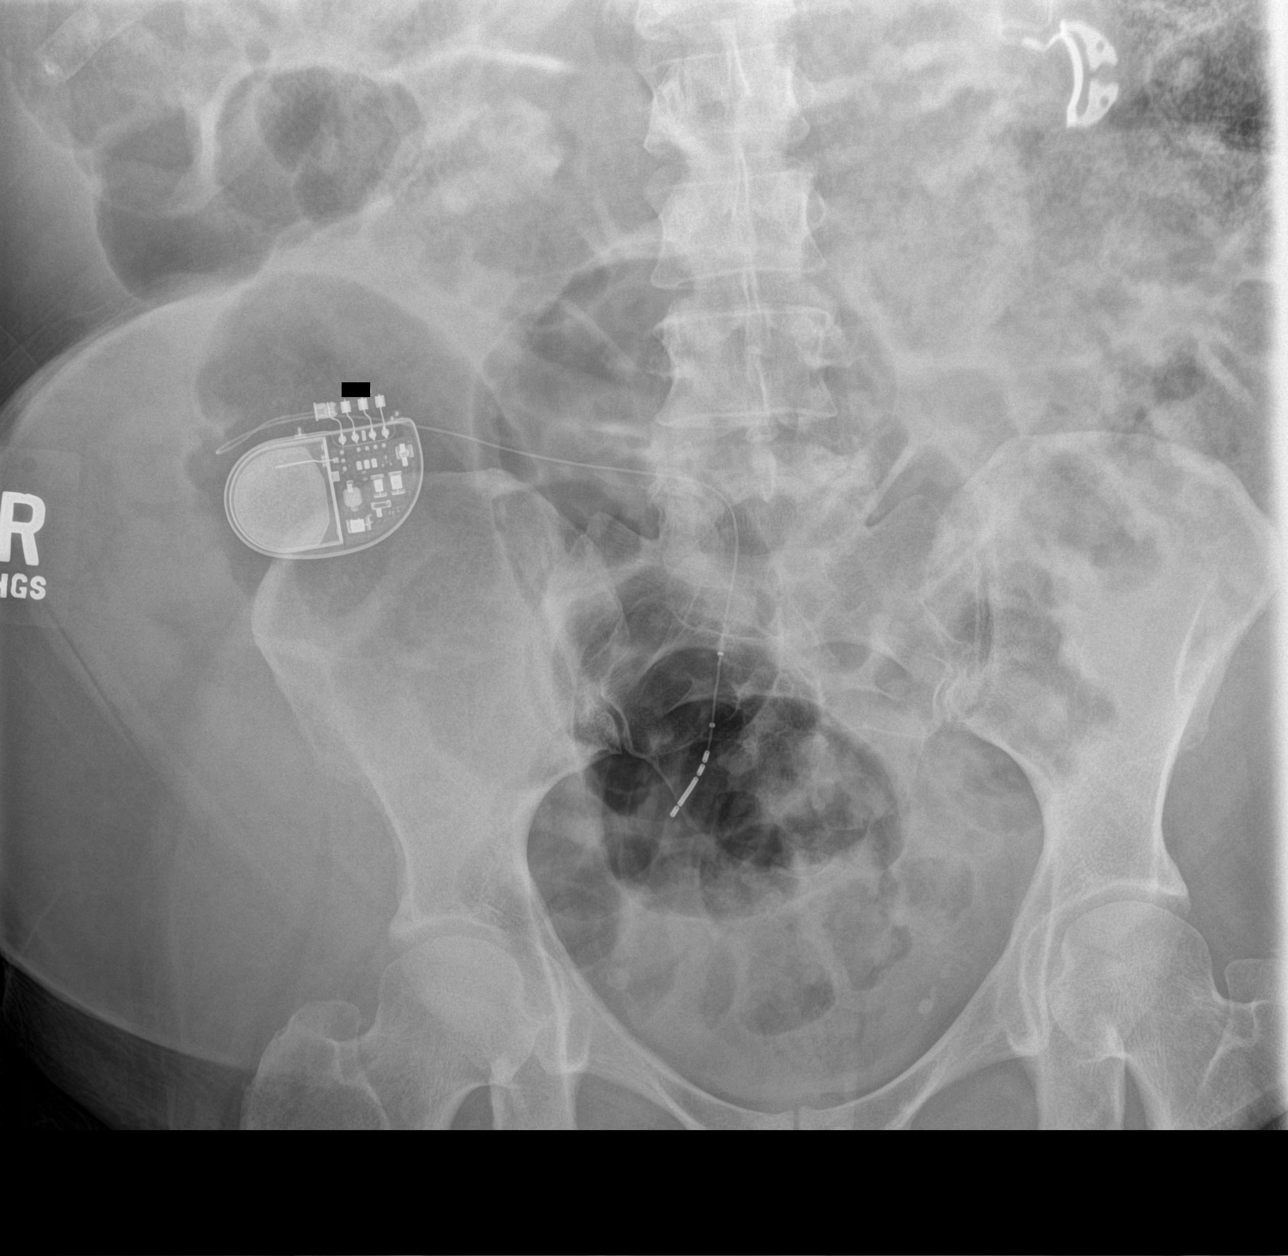

[3 of 3 positions shown; findings below may reference images not displayed]

FINDINGS: Large stool burden throughout the colon. No evidence of bowel
obstruction. Mild gaseous distention of the colon. No organomegaly.
No suspicious calcification or free air. Prior cholecystectomy.
Stimulator device noted in the right pelvis.
IMPRESSION: Large stool burden and mild gaseous distention of the colon. No
obstruction or free air.

## 2016-06-05 ENCOUNTER — Other Ambulatory Visit: Payer: Self-pay

## 2016-06-05 MED ORDER — "INSULIN SYRINGE-NEEDLE U-100 31G X 1/4"" 0.3 ML MISC": 1.0000 | Freq: Every day | 0 refills | Status: DC | PRN

## 2016-06-12 ENCOUNTER — Ambulatory Visit: Payer: Medicare Other | Admitting: Gastroenterology

## 2016-06-13 IMAGING — US ULTRASOUND CORE BIOPSY
1 series · 13 of 13 positions shown · non-contrast
Comparison: none

CLINICAL DATA: 49-year-old female with transaminitis. She has been
referred for ultrasound biopsy of the liver.

[Series 1: ultrasound core biopsy · 0.32mm/px · 13 of 13 slices shown]
[im 1/13]
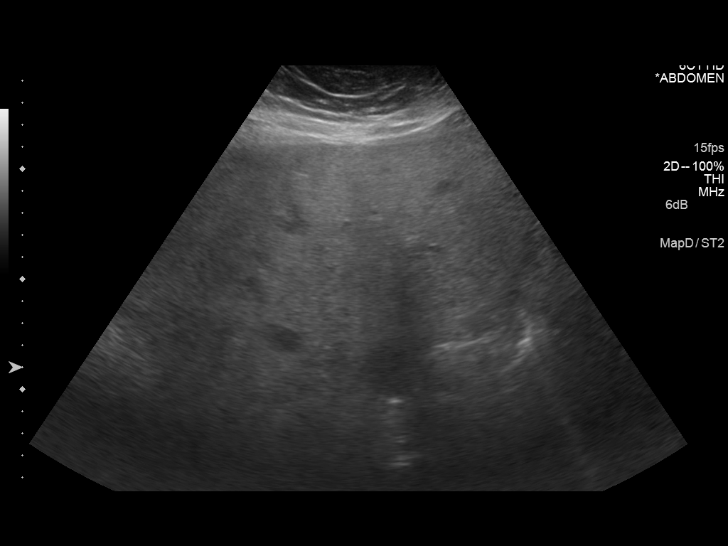
[im 2/13]
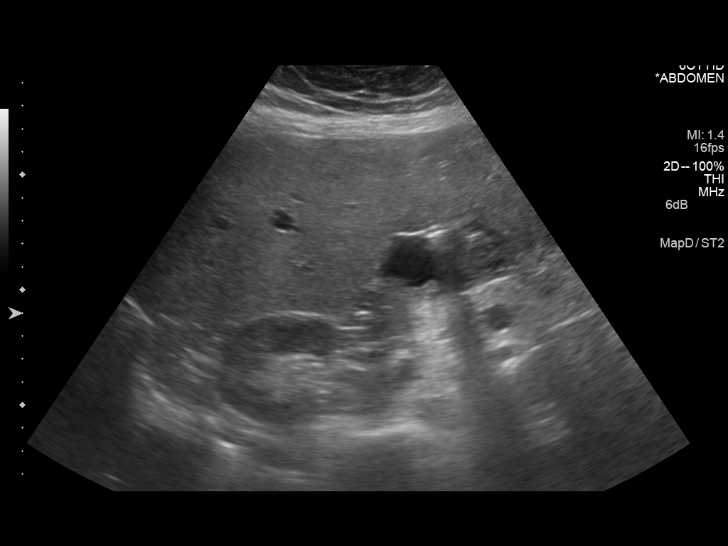
[im 3/13]
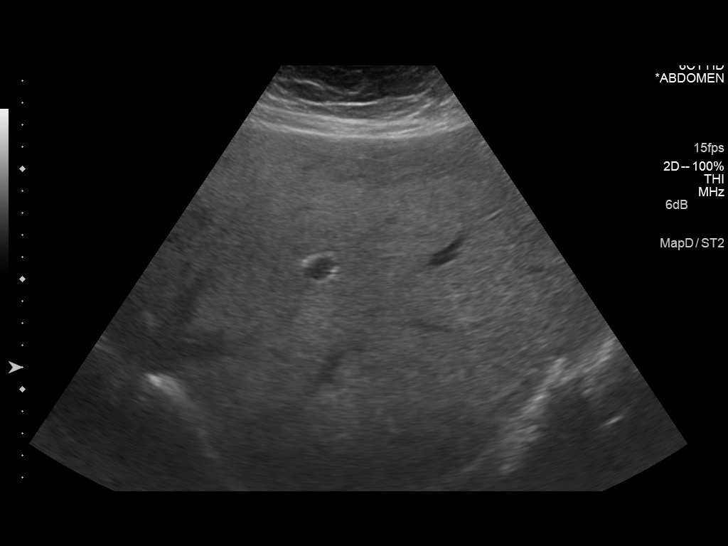
[im 4/13]
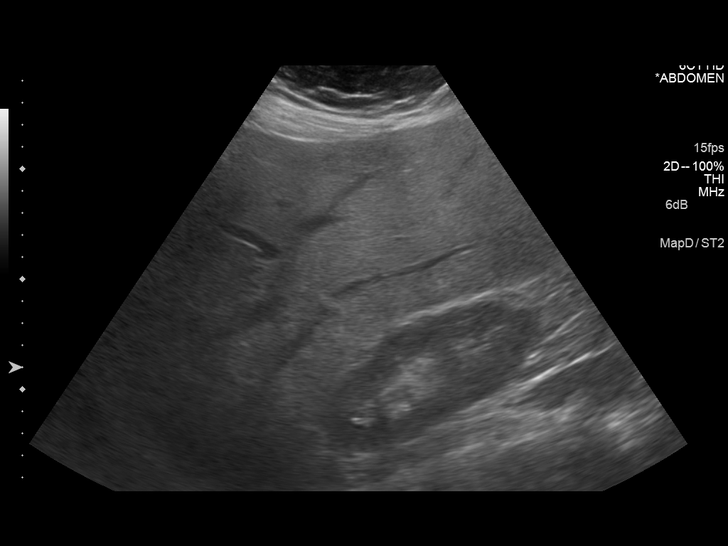
[im 5/13]
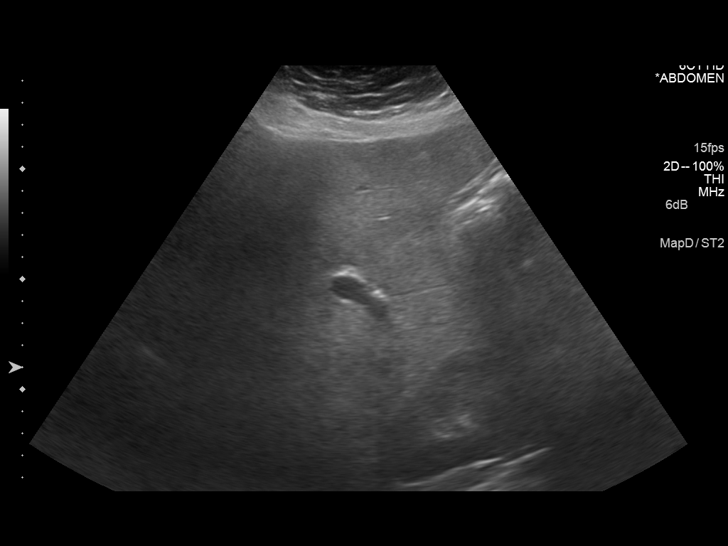
[im 6/13]
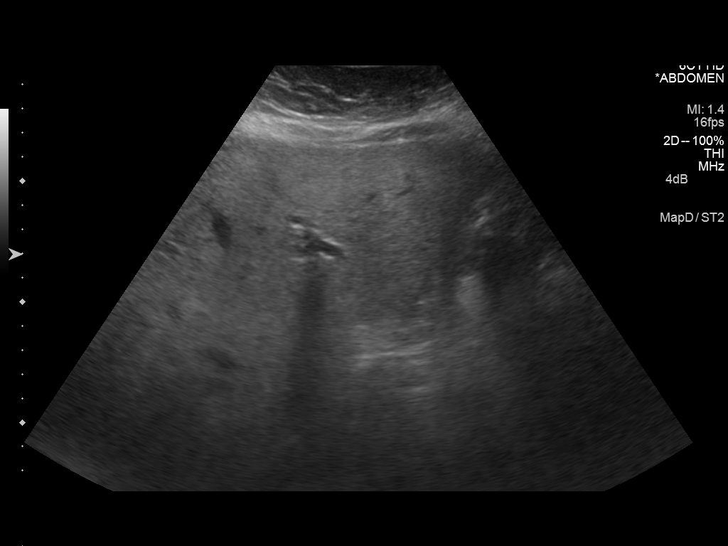
[im 7/13]
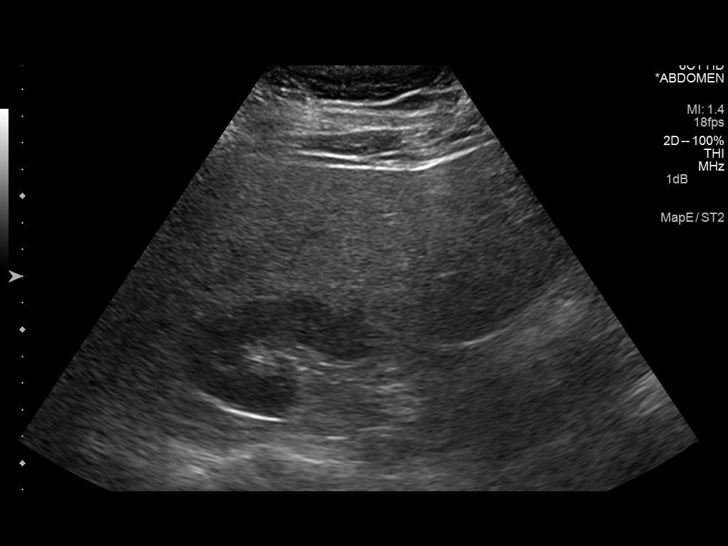
[im 8/13]
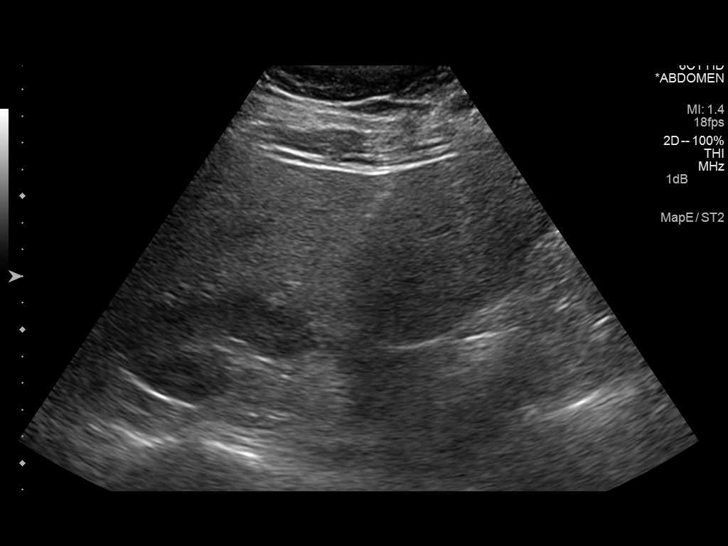
[im 9/13]
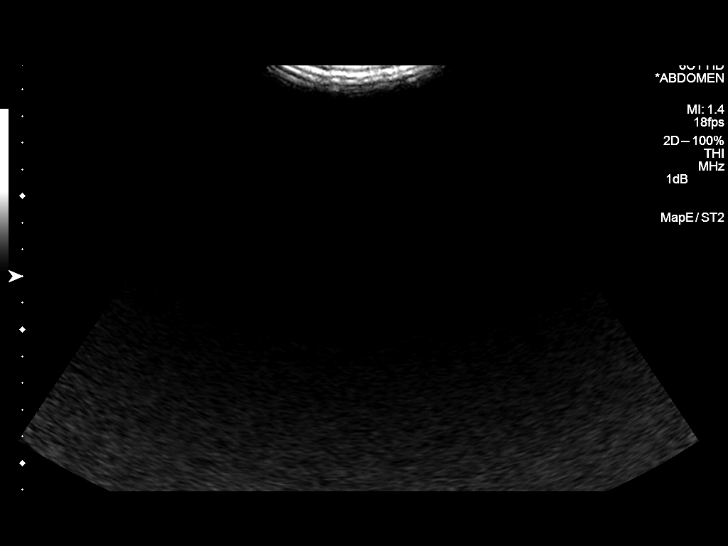
[im 10/13]
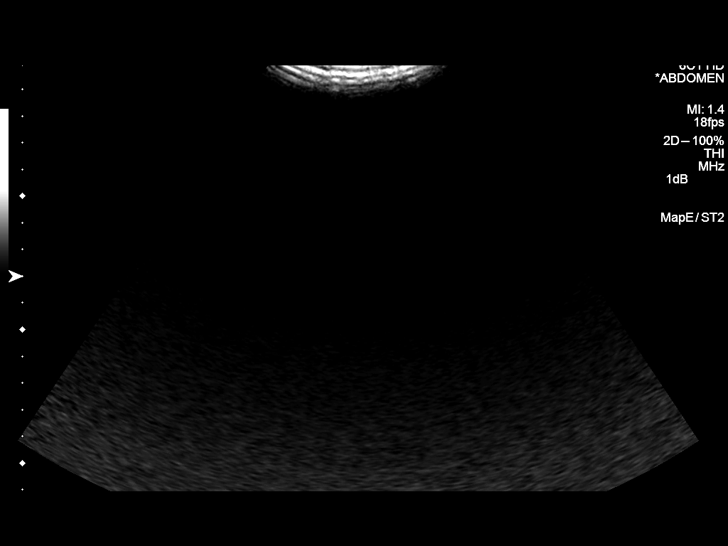
[im 11/13]
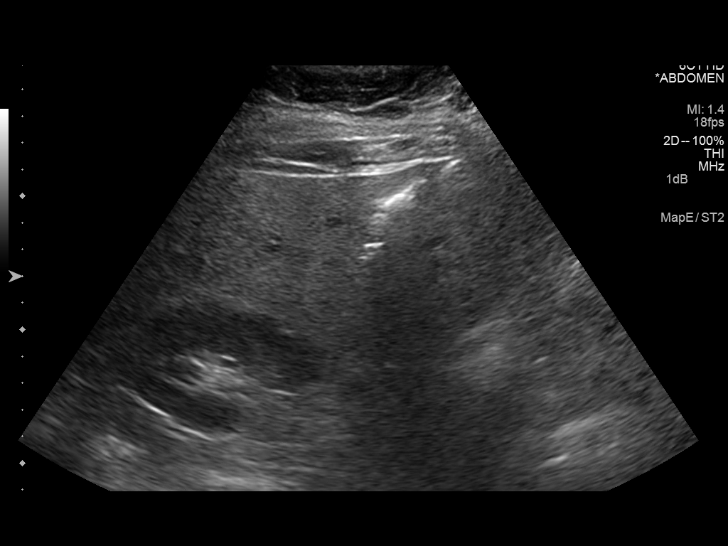
[im 12/13]
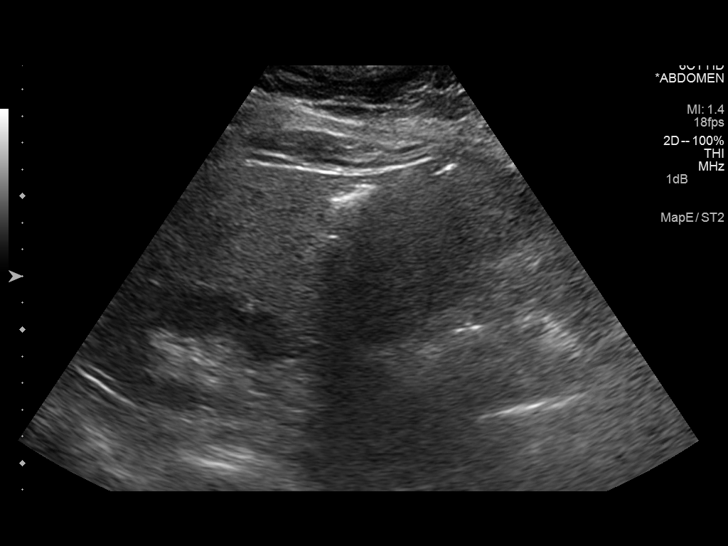
[im 13/13]
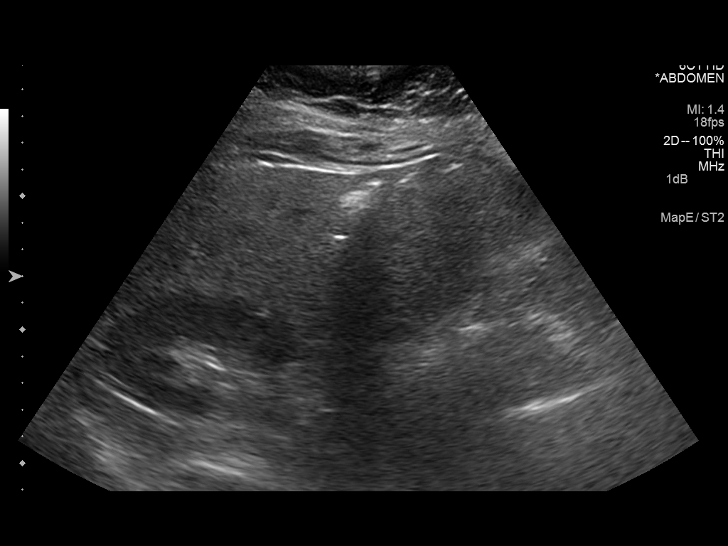

[13 of 13 positions shown; findings below may reference images not displayed]

EXAM:
ULTRASOUND GUIDED CORE BIOPSY OF LIVER

MEDICATIONS:
2.0 mg IV Versed; 50 mcg IV Fentanyl

Total Moderate Sedation Time: 20

PROCEDURE:
The procedure, risks, benefits, and alternatives were explained to
the patient. Questions regarding the procedure were encouraged and
answered. The patient understands and consents to the procedure.

The right upper quadrant was prepped with chlorhexidine in a sterile
fashion, and a sterile drape was applied covering the operative
field. A sterile gown and sterile gloves were used for the
procedure. Local anesthesia was provided with 1% Lidocaine.

Once the patient is prepped and draped sterilely, the skin and
subcutaneous tissues were generously infiltrated with lidocaine for
local anesthesia. A small stab incision was made with 11 blade
scalpel, and then a 17 gauge guide needle was advanced under
ultrasound into the right liver lobe. Once the needle position was
identified in a safe location, the stylet was removed and 3 separate
18 gauge core biopsy were retrieved.

Each of these were placed into formalin for transportation to the
lab.

Three Gel-Foam pledgets were then infused with a small amount of
saline.

A final image was stored after the needle was removed.

The patient tolerated the procedure well and remained
hemodynamically stable throughout.

No complications were encountered and no significant blood loss was
encountered.

EBL:  0

COMPLICATIONS:
None.
FINDINGS: Ultrasound survey demonstrates coarsened echotexture of the liver,
compatible with findings on prior CT.

Images during the case demonstrate placement of needle into the
right liver lobe.

Final images demonstrate gas from Gel-Foam pledget infusion. No
fluid on the liver capsule. No complicating features.
IMPRESSION: Status post ultrasound-guided medical liver biopsy of the right
liver lobe. Tissue specimen sent to pathology for complete
histopathologic analysis.

## 2016-06-17 ENCOUNTER — Emergency Department: Payer: Medicare Other

## 2016-06-17 ENCOUNTER — Emergency Department
Admission: EM | Admit: 2016-06-17 | Discharge: 2016-06-17 | Disposition: A | Discharge status: Home / Self Care | Payer: Medicare Other | Attending: Emergency Medicine | Admitting: Emergency Medicine

## 2016-06-17 ENCOUNTER — Encounter: Payer: Self-pay | Admitting: Emergency Medicine

## 2016-06-17 DIAGNOSIS — Z794 Long term (current) use of insulin: Secondary | ICD-10-CM | POA: Insufficient documentation

## 2016-06-17 DIAGNOSIS — E1122 Type 2 diabetes mellitus with diabetic chronic kidney disease: Secondary | ICD-10-CM | POA: Diagnosis not present

## 2016-06-17 DIAGNOSIS — J449 Chronic obstructive pulmonary disease, unspecified: Secondary | ICD-10-CM | POA: Diagnosis not present

## 2016-06-17 DIAGNOSIS — Z79899 Other long term (current) drug therapy: Secondary | ICD-10-CM | POA: Insufficient documentation

## 2016-06-17 DIAGNOSIS — I509 Heart failure, unspecified: Secondary | ICD-10-CM | POA: Diagnosis not present

## 2016-06-17 DIAGNOSIS — E039 Hypothyroidism, unspecified: Secondary | ICD-10-CM | POA: Insufficient documentation

## 2016-06-17 DIAGNOSIS — N183 Chronic kidney disease, stage 3 (moderate): Secondary | ICD-10-CM | POA: Diagnosis not present

## 2016-06-17 DIAGNOSIS — E1165 Type 2 diabetes mellitus with hyperglycemia: Secondary | ICD-10-CM | POA: Insufficient documentation

## 2016-06-17 DIAGNOSIS — I130 Hypertensive heart and chronic kidney disease with heart failure and stage 1 through stage 4 chronic kidney disease, or unspecified chronic kidney disease: Secondary | ICD-10-CM | POA: Diagnosis not present

## 2016-06-17 DIAGNOSIS — R739 Hyperglycemia, unspecified: Secondary | ICD-10-CM

## 2016-06-17 DIAGNOSIS — R1031 Right lower quadrant pain: Secondary | ICD-10-CM | POA: Insufficient documentation

## 2016-06-17 DIAGNOSIS — R1030 Lower abdominal pain, unspecified: Secondary | ICD-10-CM | POA: Diagnosis not present

## 2016-06-17 DIAGNOSIS — J45909 Unspecified asthma, uncomplicated: Secondary | ICD-10-CM | POA: Insufficient documentation

## 2016-06-17 LAB — URINALYSIS COMPLETE WITH MICROSCOPIC (ARMC ONLY)
BACTERIA UA: NONE SEEN
BILIRUBIN URINE: NEGATIVE
Hgb urine dipstick: NEGATIVE
Leukocytes, UA: NEGATIVE
NITRITE: NEGATIVE
Protein, ur: NEGATIVE mg/dL
SPECIFIC GRAVITY, URINE: 1.027 (ref 1.005–1.030)
pH: 5 (ref 5.0–8.0)

## 2016-06-17 LAB — CBC
HCT: 37.5 % (ref 35.0–47.0)
Hemoglobin: 13.1 g/dL (ref 12.0–16.0)
MCH: 30.4 pg (ref 26.0–34.0)
MCHC: 35 g/dL (ref 32.0–36.0)
MCV: 86.8 fL (ref 80.0–100.0)
PLATELETS: 243 10*3/uL (ref 150–440)
RBC: 4.32 MIL/uL (ref 3.80–5.20)
RDW: 13.7 % (ref 11.5–14.5)
WBC: 9.7 10*3/uL (ref 3.6–11.0)

## 2016-06-17 LAB — COMPREHENSIVE METABOLIC PANEL
ALBUMIN: 4.2 g/dL (ref 3.5–5.0)
ALK PHOS: 138 U/L — AB (ref 38–126)
ALT: 31 U/L (ref 14–54)
AST: 32 U/L (ref 15–41)
Anion gap: 14 (ref 5–15)
BILIRUBIN TOTAL: 0.6 mg/dL (ref 0.3–1.2)
BUN: 29 mg/dL — AB (ref 6–20)
CALCIUM: 9.9 mg/dL (ref 8.9–10.3)
CO2: 20 mmol/L — ABNORMAL LOW (ref 22–32)
CREATININE: 1.02 mg/dL — AB (ref 0.44–1.00)
Chloride: 99 mmol/L — ABNORMAL LOW (ref 101–111)
GFR calc Af Amer: >60 mL/min (ref >60)
GLUCOSE: 471 mg/dL — AB (ref 65–99)
Potassium: 3.7 mmol/L (ref 3.5–5.1)
Sodium: 133 mmol/L — ABNORMAL LOW (ref 135–145)
TOTAL PROTEIN: 7 g/dL (ref 6.5–8.1)

## 2016-06-17 LAB — LIPASE, BLOOD: Lipase: 29 U/L (ref 11–51)

## 2016-06-17 LAB — GLUCOSE, CAPILLARY: GLUCOSE-CAPILLARY: 203 mg/dL — AB (ref 65–99)

## 2016-06-17 MED ORDER — SODIUM CHLORIDE 0.9 % IV BOLUS (SEPSIS)
1000.0000 mL | Freq: Once | INTRAVENOUS | Status: AC
  Administered 2016-06-17: 1000 mL via INTRAVENOUS

## 2016-06-17 MED ORDER — ONDANSETRON HCL 4 MG/2ML IJ SOLN
4.0000 mg | Freq: Once | INTRAMUSCULAR | Status: AC
  Administered 2016-06-17: 4 mg via INTRAVENOUS
  Filled 2016-06-17: qty 2

## 2016-06-17 MED ORDER — KETOROLAC TROMETHAMINE 30 MG/ML IJ SOLN
15.0000 mg | INTRAMUSCULAR | Status: AC
  Administered 2016-06-17: 15 mg via INTRAVENOUS
  Filled 2016-06-17: qty 1

## 2016-06-17 MED ORDER — ONDANSETRON 4 MG PO TBDP: 4.0000 mg | ORAL_TABLET | Freq: Three times a day (TID) | ORAL | 0 refills | Status: DC | PRN

## 2016-06-17 MED ORDER — IOPAMIDOL (ISOVUE-300) INJECTION 61%
100.0000 mL | Freq: Once | INTRAVENOUS | Status: AC | PRN
  Administered 2016-06-17: 100 mL via INTRAVENOUS

## 2016-06-17 MED ORDER — IOPAMIDOL (ISOVUE-300) INJECTION 61%
30.0000 mL | Freq: Once | INTRAVENOUS | Status: AC
  Administered 2016-06-17: 30 mL via ORAL

## 2016-06-17 NOTE — ED Triage Notes (Signed)
Lower abdominal and back pain began this am, FSBS registered "HI" on meter.

## 2016-06-17 NOTE — ED Provider Notes (Signed)
[facility] Emergency Department Provider Note  ____________________________________________  Time seen: Approximately 4:31 PM  I have reviewed the triage vital signs and the nursing notes.   HISTORY  Chief Complaint Abdominal Pain    HPI [name] is a 50 y.o. female who complains of right lower quadrant abdominal pain radiating to her back that started last night. Rapid onset. Waxing and waning but constant. Associated with diarrhea and nausea but no vomiting. Decreased appetite today. She reports having pain like this in the past with urinary tract infection. Has a history of hysterectomy and cholecystectomy but still has her appendix.     Past Medical History:  Diagnosis Date  . Acute renal failure ([place])   . Asthma   . Bipolar depression ([place])   . CHF (congestive heart failure) ([place])   . Chronic headaches   . COPD (chronic obstructive pulmonary disease) ([place])   . Diabetes mellitus without complication (HCC)    Type 2, insulin dependent. Patient also takes Metformin.  . Diabetic neuropathy ([place])   . GERD (gastroesophageal reflux disease)   . Headache   . Heart attack   . High blood pressure   . Hyperlipidemia   . Hypertension   . Hypomagnesemia   . Hypothyroid   . Irregular heart rhythm   . Liver lesion    Favored to be benign on CT; MRI of abd w/contract in Aug 2016. Liver Biopsy-Negative, March 2016  . Morbid obesity ([place])   . OSA (obstructive sleep apnea)   . Pinched nerve    Back  . RLS (restless legs syndrome)   . Seasonal allergies   . Sleep apnea   . Type 2 diabetes, uncontrolled, with neuropathy ([place]) 10/15/2015   Overview:  Microalbumin 7.7 12/2010: Hgb A1c 10.2% 12/2010 and 9.7% 03/2011, eye exam 10/2009 [facility] DM clinic   . Urinary incontinence   . Vitamin D deficiency      Patient Active Problem List   Diagnosis Date Noted  . Fatigue 05/01/2016  . Urinary tract infection 03/14/2016  . Adenomatous colon polyp 10/15/2015   . Airway hyperreactivity 10/15/2015  . Bipolar affective disorder ([place]) 10/15/2015  . CN (constipation) 10/15/2015  . CAFL (chronic airflow limitation) ([place]) 10/15/2015  . Drug-induced hepatic toxicity 10/15/2015  . Blood in feces 10/15/2015  . HLD (hyperlipidemia) 10/15/2015  . Hyperthyroidism 10/15/2015  . Hypoxemia 10/15/2015  . NASH (nonalcoholic steatohepatitis) 10/15/2015  . Adiposity 10/15/2015  . Restless leg syndrome 10/15/2015  . Type 2 diabetes, uncontrolled, with neuropathy ([place]) 10/15/2015  . Severe obstructive sleep apnea 10/15/2015  . Elevated alkaline phosphatase level 09/22/2015  . Urinary bladder incontinence 09/22/2015  . Diarrhea 08/18/2015  . Low back pain with left-sided sciatica 08/18/2015  . Encounter for monitoring tricyclic antidepressant therapy 05/13/2015  . Bipolar disorder ([place]) 05/11/2015  . Liver disease 05/11/2015  . Diabetes ([place]) 05/11/2015  . Apnea, sleep 05/05/2015  . Heart valve disease 05/05/2015  . Chronic kidney disease, stage III (moderate) 03/15/2015  . Migraine 03/12/2015  . COPD (chronic obstructive pulmonary disease) ([place]) 01/20/2015  . 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 GERD (gastroesophageal reflux disease) 01/20/2015  . Disorder of smooth muscle 01/20/2015  . Hepatic encephalopathy ([place]) 01/20/2015  . Hypothyroid   . Vitamin D deficiency   . Urinary incontinence   . Liver lesion   . Hypomagnesemia   . Obstructive sleep apnea 10/20/2013  . PERIPHERAL NEUROPATHY 03/04/2009  . HEMORRHOIDS, EXTERNAL 09/03/2008  . ALLERGIC RHINITIS 07/04/2007  .  ASTHMA, PERSISTENT, MODERATE 07/04/2007  . INTERSTITIAL CYSTITIS 07/04/2007  . HYPERTRIGLYCERIDEMIA 07/03/2007  . FATIGUE, CHRONIC 07/03/2007  . HYPERTENSION, BENIGN ESSENTIAL 06/19/2007     Past Surgical History:  Procedure Laterality Date  . ABDOMINAL HYSTERECTOMY    . ABDOMINAL HYSTERECTOMY     Partial  . BLADDER SURGERY     x 2  . CHOLECYSTECTOMY    . LIVER  BIOPSY  March 2016   Negative  . LUNG BIOPSY    . TUBAL LIGATION       Prior to Admission medications   Medication Sig Start Date End Date Taking? Authorizing Provider  ACCU-CHEK AVIVA PLUS test strip Check FSBS three times a day; LON 99 months, DX E11.40 05/10/16   Arnetha Courser, MD  ACCU-CHEK SOFTCLIX LANCETS lancets Check FSBS three times a day; LON 99 months, DX E11.40 05/10/16   Arnetha Courser, MD  albuterol (PROVENTIL HFA;VENTOLIN HFA) 108 (90 Base) MCG/ACT inhaler Inhale 2 puffs into the lungs every 4 (four) hours as needed for wheezing or shortness of breath. 11/19/15   Arnetha Courser, MD  amLODipine (NORVASC) 2.5 MG tablet Take 1 tablet (2.5 mg total) by mouth daily. 01/21/16   Arnetha Courser, MD  atorvastatin (LIPITOR) 40 MG tablet Take 1 tablet (40 mg total) by mouth at bedtime. 05/01/16   Arnetha Courser, MD  cyclobenzaprine (FLEXERIL) 10 MG tablet Take 1 tablet (10 mg total) by mouth every 8 (eight) hours as needed for muscle spasms. 03/14/16   Arnetha Courser, MD  DULoxetine (CYMBALTA) 60 MG capsule Take 120 mg by mouth daily.    Historical Provider, MD  fluticasone (FLONASE) 50 MCG/ACT nasal spray Place 2 sprays into both nostrils daily. 08/03/15   Arnetha Courser, MD  Icosapent Ethyl (VASCEPA) 1 g CAPS Take 2 capsules by mouth 2 (two) times daily. For cholesterol; stop fenofibrate 05/01/16   Arnetha Courser, MD  insulin lispro (HUMALOG) 100 UNIT/ML injection Per endo: Sliding scale goes up by 5 units every 50; with meals 70-150=30units 151-200=35units 201-250=40units 251-300=45units 301-350=50units 351-400=55units 401-450=60units 05/01/16   Arnetha Courser, MD  insulin NPH Human (HUMULIN N,NOVOLIN N) 100 UNIT/ML injection Per endo: 75 units in am, 50 units at lunch and 75 with supper; subscutaneous 05/01/16   Arnetha Courser, MD  Insulin Syringe-Needle U-100 (ULTICARE INSULIN SYRINGE) 31G X 1/4" 0.3 ML MISC Inject 1 each into the skin 5 (five) times daily as needed. 06/05/16   Arnetha Courser, MD  lamoTRIgine (LAMICTAL) 150 MG tablet Take 150 mg by mouth daily.  03/22/15   Historical Provider, MD  levothyroxine (SYNTHROID, LEVOTHROID) 75 MCG tablet Take 1 tablet (75 mcg total) by mouth daily. 03/22/16   Arnetha Courser, MD  LINZESS 145 MCG CAPS capsule Take 145 mcg by mouth daily.  09/30/15   Historical Provider, MD  magnesium oxide (MAG-OX) 400 MG tablet Take 1 tablet (400 mg total) by mouth 3 (three) times daily. 05/04/16   Arnetha Courser, MD  metoprolol tartrate (LOPRESSOR) 25 MG tablet Take 0.5 tablets (12.5 mg total) by mouth 2 (two) times daily. 05/17/16   Arnetha Courser, MD  ondansetron (ZOFRAN ODT) 4 MG disintegrating tablet Take 1 tablet (4 mg total) by mouth every 8 (eight) hours as needed for nausea or vomiting. 06/17/16   Carrie Mew, MD  QUEtiapine (SEROQUEL) 25 MG tablet Take 25 mg by mouth every morning. Take one in am, one at lunch    Historical Provider, MD  QUEtiapine (SEROQUEL) 400 MG tablet Take 800 mg by mouth at bedtime. Take 2 tabs at bedtime    Historical Provider, MD  ranitidine (ZANTAC) 300 MG tablet Take 1 tablet (300 mg total) by mouth at bedtime. 05/15/16   Arnetha Courser, MD  REXULTI 1 MG TABS Take 1 mg by mouth daily.  07/26/15   Historical Provider, MD  Vitamin D, Ergocalciferol, (DRISDOL) 50000 units CAPS capsule Take 1 capsule (50,000 Units total) by mouth every 7 (seven) days. 05/03/16 06/28/16  Arnetha Courser, MD  zolpidem (AMBIEN) 10 MG tablet Take 10 mg by mouth at bedtime.  07/26/15   Historical Provider, MD     Allergies Morphine and related; Codeine; Codeine; Hydrocodone-acetaminophen; Mirtazapine; Morphine and related; Sulfamethoxazole-trimethoprim; and Vicodin [hydrocodone-acetaminophen]   Family History  Problem Relation Age of Onset  . Diabetes Mother   . Heart disease Mother   . Hyperlipidemia Mother   . Hypertension Mother   . Kidney disease Mother   . Mental illness Mother   . Hypothyroidism Mother   . Stroke Mother   .  Osteoporosis Mother   . Glaucoma Mother   . Congestive Heart Failure Mother   . Hypertension Father   . Asthma Daughter   . Cancer Daughter     throat  . Heart disease Maternal Grandfather   . Rheum arthritis Maternal Grandfather   . Cancer Maternal Grandfather     liver  . Cancer Paternal Grandmother     lung    Social History Social History  Substance Use Topics  . Smoking status: Never Smoker  . Smokeless tobacco: Never Used  . Alcohol use No    Review of Systems  Constitutional:   Positive chills. No fever.  ENT:   No sore throat. No rhinorrhea. Cardiovascular:   No chest pain. Respiratory:   No dyspnea or cough. Gastrointestinal:   Positive abdominal pain and diarrhea.  Genitourinary:   Negative for dysuria or difficulty urinating.  10-point ROS otherwise negative.  ____________________________________________   PHYSICAL EXAM:  VITAL SIGNS: ED Triage Vitals  Enc Vitals Group     BP 06/17/16 1359 128/79     Pulse Rate 06/17/16 1359 (!) 106     Resp 06/17/16 1359 20     Temp 06/17/16 1359 98.5 F (36.9 C)     Temp Source 06/17/16 1359 Oral     SpO2 06/17/16 1359 96 %     Weight 06/17/16 1401 191 lb (86.6 kg)     Height 06/17/16 1401 4' 11"  (1.499 m)     Head Circumference --      Peak Flow --      Pain Score 06/17/16 1401 9     Pain Loc --      Pain Edu? --      Excl. in [place]? --     Vital signs reviewed, nursing assessments reviewed.   Constitutional:   Alert and oriented. Not in distress Eyes:   No scleral icterus. No conjunctival pallor. PERRL. EOMI.  No nystagmus. ENT   Head:   Normocephalic and atraumatic.   Nose:   No congestion/rhinnorhea. No septal hematoma   Mouth/Throat:   MMM, no pharyngeal erythema. No peritonsillar mass.    Neck:   No stridor. No SubQ emphysema. No meningismus. Hematological/Lymphatic/Immunilogical:   No cervical lymphadenopathy. Cardiovascular:   RRR. Symmetric bilateral radial and DP pulses.  No  murmurs.  Respiratory:   Normal respiratory effort without tachypnea nor retractions. Breath sounds are clear and equal  bilaterally. No wheezes/rales/rhonchi. Gastrointestinal:   Soft with right lower quadrant tenderness around McBurney's point.. Non distended. There is no CVA tenderness.  No rebound, rigidity, or guarding. Genitourinary:   deferred Musculoskeletal:   Nontender with normal range of motion in all extremities. No joint effusions.  No lower extremity tenderness.  No edema. Neurologic:   Normal speech and language.  CN 2-10 normal. Motor grossly intact. No gross focal neurologic deficits are appreciated.  Skin:    Skin is warm, dry and intact. No rash noted.  No petechiae, purpura, or bullae.  ____________________________________________    LABS (pertinent positives/negatives) (all labs ordered are listed, but only abnormal results are displayed) Labs Reviewed  COMPREHENSIVE METABOLIC PANEL - Abnormal; Notable for the following:       Result Value   Sodium 133 (*)    Chloride 99 (*)    CO2 20 (*)    Glucose, Bld 471 (*)    BUN 29 (*)    Creatinine, Ser 1.02 (*)    Alkaline Phosphatase 138 (*)    All other components within normal limits  URINALYSIS COMPLETEWITH MICROSCOPIC ([facility] ONLY) - Abnormal; Notable for the following:    Color, Urine STRAW (*)    APPearance CLEAR (*)    Glucose, UA >500 (*)    Ketones, ur TRACE (*)    Squamous Epithelial / LPF 0-5 (*)    All other components within normal limits  LIPASE, BLOOD  CBC  CBG MONITORING, ED   ____________________________________________   EKG    ____________________________________________    RADIOLOGY  CT abdomen pelvis unremarkable  ____________________________________________   PROCEDURES Procedures  ____________________________________________   INITIAL IMPRESSION / ASSESSMENT AND PLAN / ED COURSE  Pertinent labs & imaging results that were available during my care of the patient were  reviewed by me and considered in my medical decision making (see chart for details).  Patient not in distress, presents with right lower quadrant pain and tenderness. Hyperglycemia tachycardia CONCERNING for possible appendicitis. We'll check CT. Patient has a severe allergy to opioids, so for pain control we'll give her a low dose of Toradol as well as some Zofran for her nausea. IV fluids.    ----------------------------------------- 7:22 PM on 06/17/2016 -----------------------------------------  Patient remains stable, symptoms controlled. Vital signs improved. Workup negative. We'll recheck fingerstick, but no evidence of acidosis so even moderate hyperglycemia is tolerable at this point and to continue medications at home. Counseled patient on hydration. Follow up with primary care in 2 days. Counseled on possible early appendicitis, return if worsening symptoms.Considering the patient's symptoms, medical history, and physical examination today, I have low suspicion for cholecystitis or biliary pathology, pancreatitis, perforation or bowel obstruction, hernia, intra-abdominal abscess, AAA or dissection, volvulus or intussusception, mesenteric ischemia, or appendicitis.     Clinical Course   ____________________________________________   FINAL CLINICAL IMPRESSION(S) / ED DIAGNOSES  Final diagnoses:  RLQ abdominal pain  Hyperglycemia       Portions of this note were generated with dragon dictation software. Dictation errors may occur despite best attempts at proofreading.    Carrie Mew, MD 06/17/16 [phone]

## 2016-06-19 DIAGNOSIS — R0902 Hypoxemia: Secondary | ICD-10-CM | POA: Diagnosis not present

## 2016-06-19 DIAGNOSIS — G4733 Obstructive sleep apnea (adult) (pediatric): Secondary | ICD-10-CM | POA: Diagnosis not present

## 2016-06-19 DIAGNOSIS — J449 Chronic obstructive pulmonary disease, unspecified: Secondary | ICD-10-CM | POA: Diagnosis not present

## 2016-06-20 ENCOUNTER — Other Ambulatory Visit: Payer: Self-pay

## 2016-06-20 DIAGNOSIS — E1165 Type 2 diabetes mellitus with hyperglycemia: Secondary | ICD-10-CM

## 2016-06-20 DIAGNOSIS — Z794 Long term (current) use of insulin: Principal | ICD-10-CM

## 2016-06-21 MED ORDER — INSULIN LISPRO 100 UNIT/ML ~~LOC~~ SOLN: SUBCUTANEOUS | 0 refills | Status: DC

## 2016-06-21 NOTE — Telephone Encounter (Signed)
I put in a referral to endocrinologist on August 28th Please follow-up on that and get her seen with any endo who will see her ASAP I'll refill the insulin in the meantime Thank you

## 2016-06-22 IMAGING — CT CT ABD-PELV W/ CM
1 of 3 series · 14 of 32 positions shown, 19 images · IV contrast (omnipaque)
Comparison: 11/01/2014

CLINICAL DATA: Liver biopsy earlier this week, hypertension,
coronary artery disease, not acting RIGHT today, prior hysterectomy,
cholecystectomy, bladder stimulator

EXAM:
CT ABDOMEN AND PELVIS WITH CONTRAST
TECHNIQUE: Multidetector CT imaging of the abdomen and pelvis was performed
using the standard protocol following bolus administration of
intravenous contrast. Sagittal and coronal MPR images reconstructed
from axial data set.
CONTRAST:  Dilute oral contrast.  100 cc Omnipaque 350 IV.

[Series 2: routine abd pel with · axial · 0.83mm/px · z∈[-915,-470]mm · 14 of 101 slices shown, 19 images]
[im 6/101  soft-tissue]
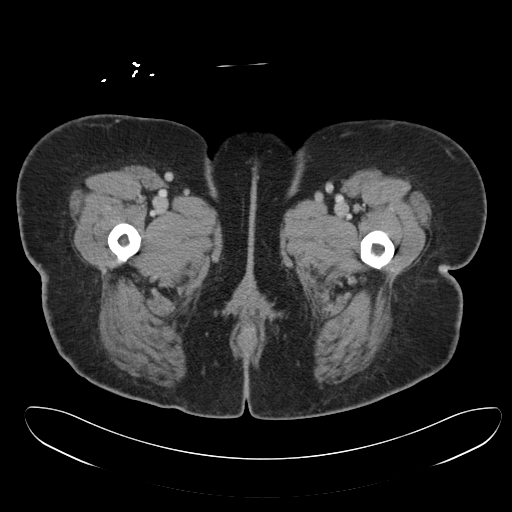
[im 6/101  bone]
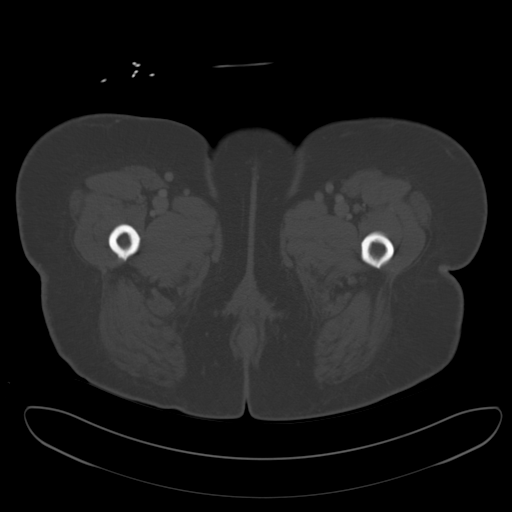
[im 16/101  soft-tissue]
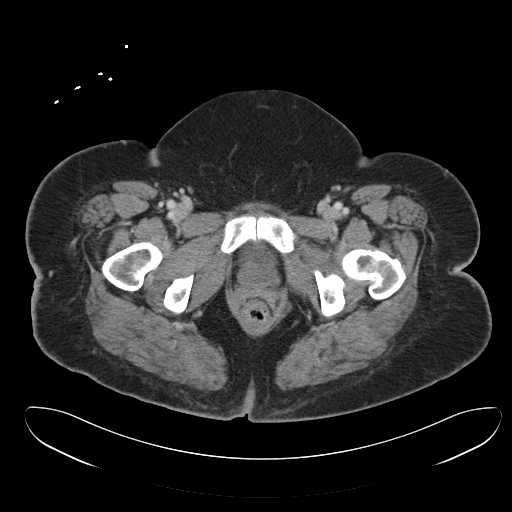
[im 22/101  soft-tissue]
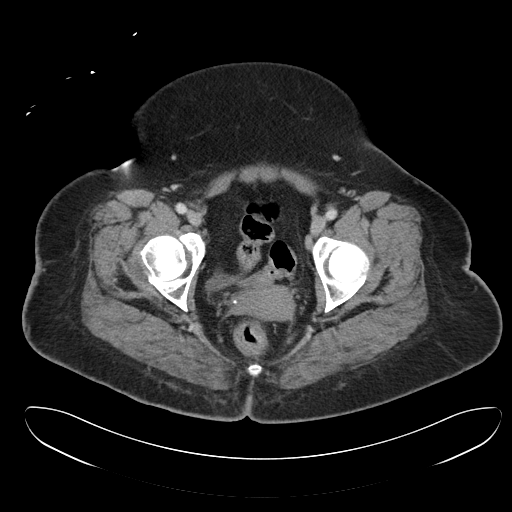
[im 27/101  soft-tissue]
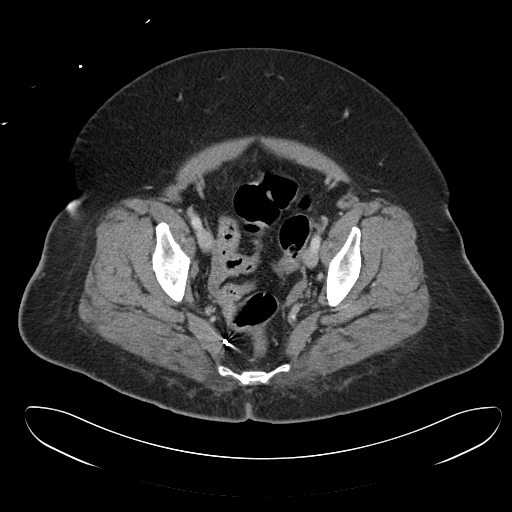
[im 37/101  soft-tissue]
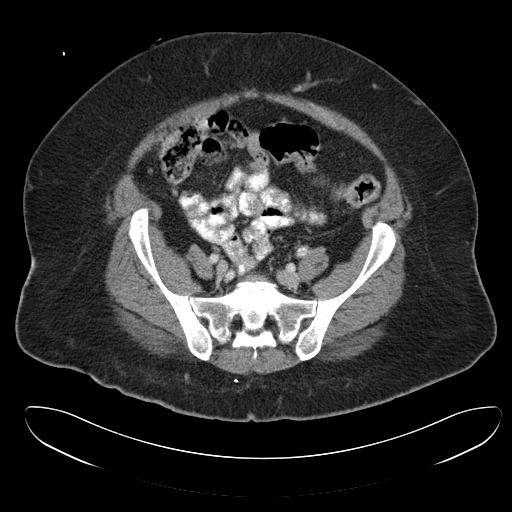
[im 43/101  soft-tissue]
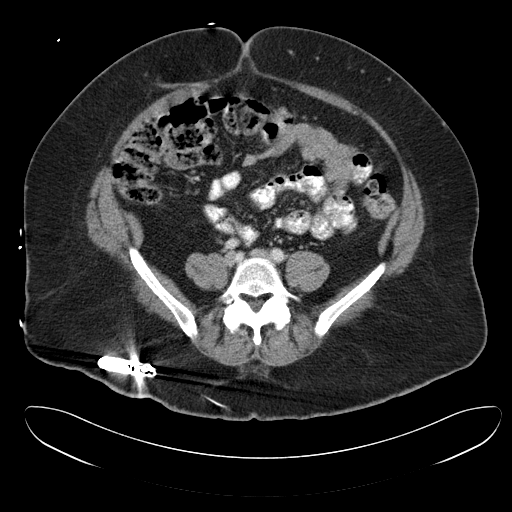
[im 53/101  soft-tissue]
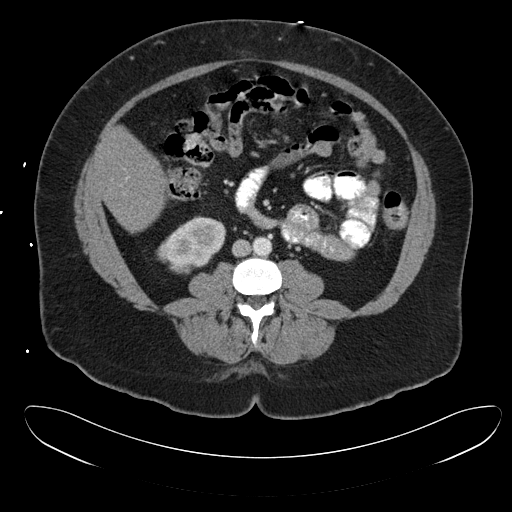
[im 58/101  soft-tissue]
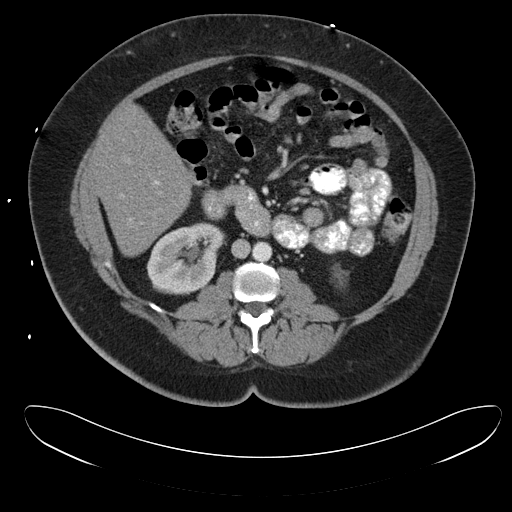
[im 64/101  soft-tissue]
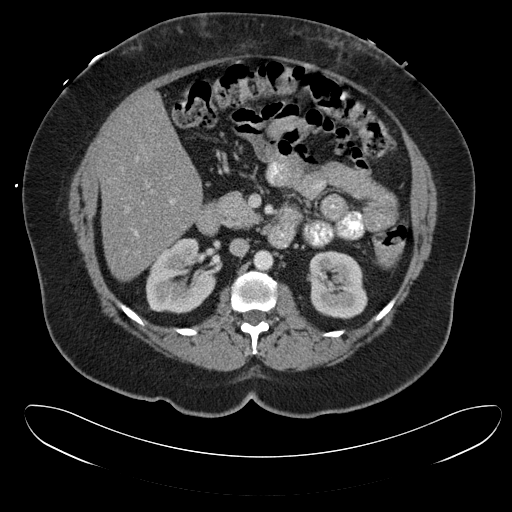
[im 64/101  bone]
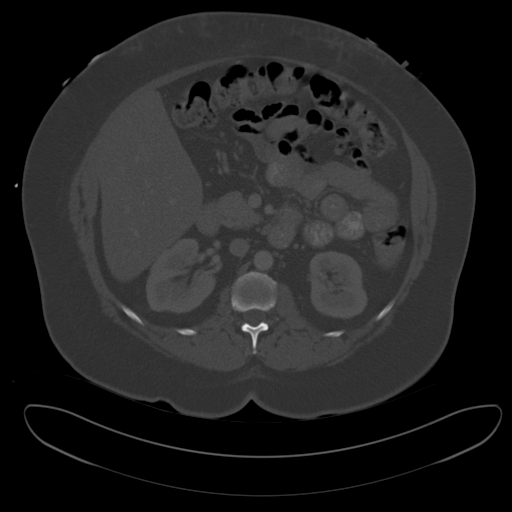
[im 74/101  soft-tissue]
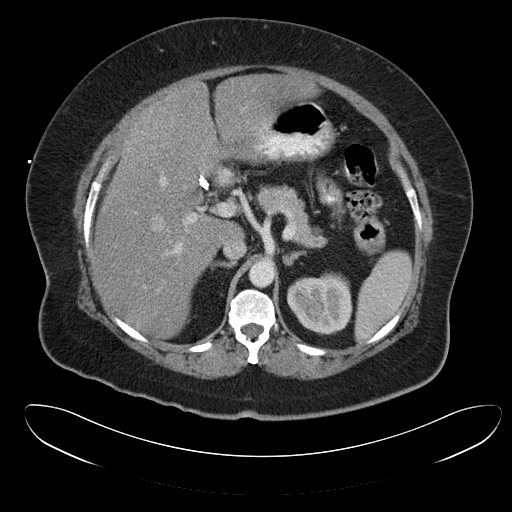
[im 79/101  soft-tissue]
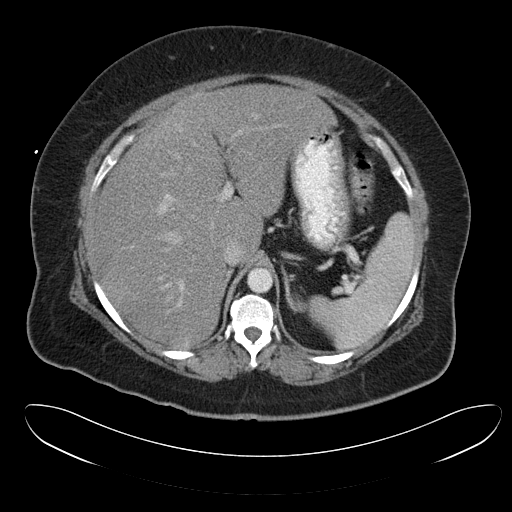
[im 79/101  lung]
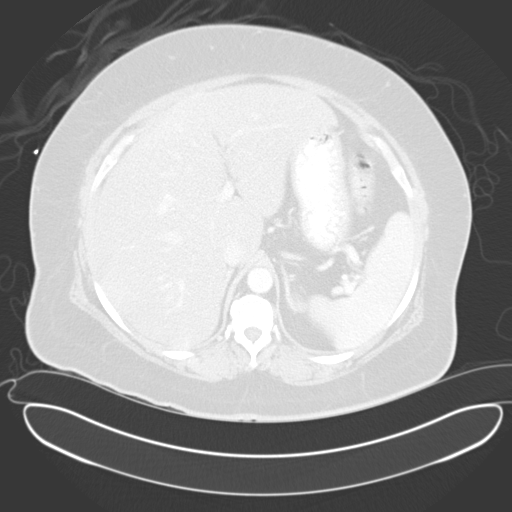
[im 85/101  soft-tissue]
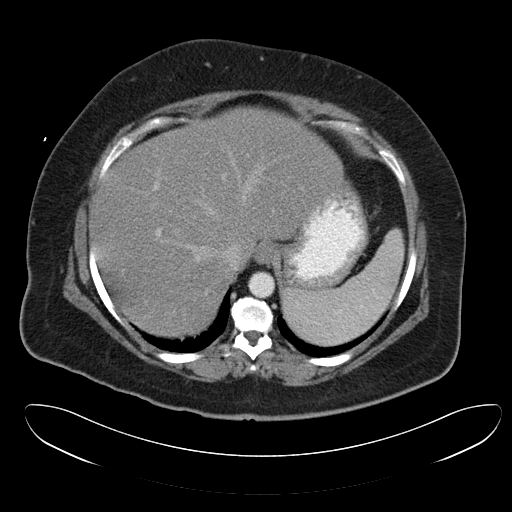
[im 85/101  lung]
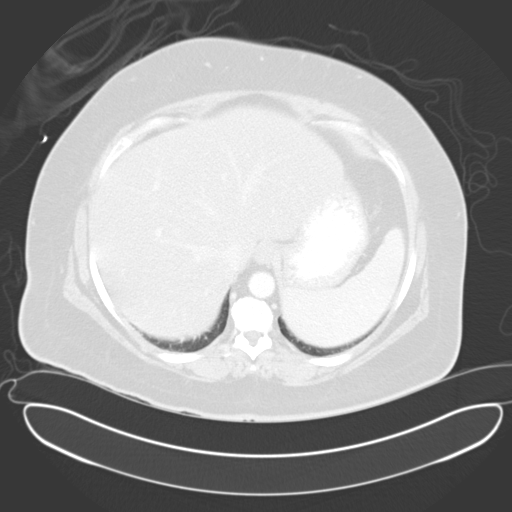
[im 90/101  lung]
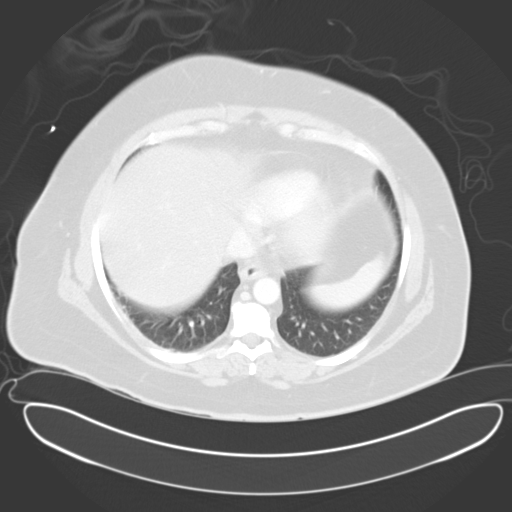
[im 95/101  soft-tissue]
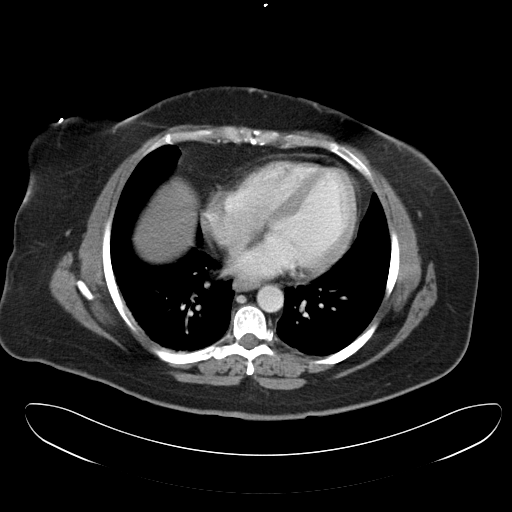
[im 95/101  lung]
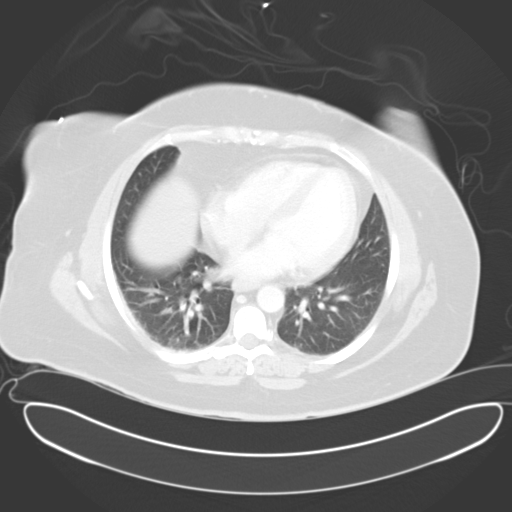

[14 of 32 positions shown; findings below may reference images not displayed]

FINDINGS: Lung bases clear.

Diffuse fatty infiltration of liver.

Post cholecystectomy.

Site of prior liver biopsy is not identified.

No hepatic mass, subcapsular hematoma or adjacent fluid.

Remainder of liver, spleen, pancreas, kidneys, and adrenal glands
normal.

Stomach and bowel loops grossly normal for technique.

Normal appendix.

Normal sized ovaries bilaterally with tubular fluid collection LEFT
adnexal likely hydrosalpinx 4.0 x 1.2 cm image 77.

Bladder decompressed with unremarkable ureters.

No mass, adenopathy, free fluid, free air, or hernia.

Nonspecific subcutaneous infiltration in anterior abdominal wall
question related to medication injection, cellulitis, or trauma.

Stimulator in pelvis.

No acute osseous findings.
IMPRESSION: Fatty infiltration of liver.

Question mild LEFT post hysterectomy.

No acute intra-abdominal or intrapelvic abnormalities.

## 2016-06-29 ENCOUNTER — Telehealth: Payer: Self-pay | Admitting: Family Medicine

## 2016-06-29 NOTE — Telephone Encounter (Signed)
Patient states that it is really hard to get an appointment with you, therefore is requesting an antibiotic for UTI. Symptoms include: burning, itching, odor, frequent urination and feel sick on stomach. Started 3 days ago. [facility]

## 2016-06-29 NOTE — Telephone Encounter (Signed)
Please urge her to do a MyChart visit please

## 2016-07-03 ENCOUNTER — Other Ambulatory Visit: Payer: Self-pay

## 2016-07-03 DIAGNOSIS — E781 Pure hyperglyceridemia: Secondary | ICD-10-CM

## 2016-07-03 NOTE — Telephone Encounter (Signed)
Patient verbally informed but did not express interest in mychart

## 2016-07-03 NOTE — Telephone Encounter (Signed)
patient notified

## 2016-07-03 NOTE — Telephone Encounter (Signed)
Then please urge her to go to urgent care; she needs evaluate and urine specimen and I can't do this over the phone; thank you

## 2016-07-04 ENCOUNTER — Other Ambulatory Visit: Payer: Self-pay | Admitting: Family Medicine

## 2016-07-04 DIAGNOSIS — E781 Pure hyperglyceridemia: Secondary | ICD-10-CM

## 2016-07-04 MED ORDER — ATORVASTATIN CALCIUM 40 MG PO TABS: 40.0000 mg | ORAL_TABLET | Freq: Every day | ORAL | 1 refills | Status: DC

## 2016-07-11 ENCOUNTER — Ambulatory Visit: Payer: Medicare Other | Admitting: Gastroenterology

## 2016-07-18 ENCOUNTER — Other Ambulatory Visit: Payer: Self-pay

## 2016-07-18 MED ORDER — AMLODIPINE BESYLATE 2.5 MG PO TABS: 2.5000 mg | ORAL_TABLET | Freq: Every day | ORAL | 5 refills | Status: DC

## 2016-07-18 NOTE — Telephone Encounter (Signed)
I really must insist that patient get her diabetic supplies and diabetes care from an endocrinologist Referral to endo was entered back in August; prior to that, I referred her to endo in January 2017 If she is not seeing endo, she needs an appt asap with me for labs, and you can send me with refill for syringes back I'll approve the amlodipine

## 2016-07-20 DIAGNOSIS — J449 Chronic obstructive pulmonary disease, unspecified: Secondary | ICD-10-CM | POA: Diagnosis not present

## 2016-07-20 DIAGNOSIS — G4733 Obstructive sleep apnea (adult) (pediatric): Secondary | ICD-10-CM | POA: Diagnosis not present

## 2016-07-20 DIAGNOSIS — R0902 Hypoxemia: Secondary | ICD-10-CM | POA: Diagnosis not present

## 2016-07-31 ENCOUNTER — Other Ambulatory Visit: Payer: Self-pay

## 2016-07-31 MED ORDER — "INSULIN SYRINGE-NEEDLE U-100 31G X 1/4"" 0.3 ML MISC": 1.0000 | Freq: Every day | 0 refills | Status: DC | PRN

## 2016-08-01 ENCOUNTER — Ambulatory Visit: Payer: Medicare Other | Admitting: Gastroenterology

## 2016-08-08 ENCOUNTER — Encounter: Payer: Self-pay | Admitting: Family Medicine

## 2016-08-10 ENCOUNTER — Other Ambulatory Visit: Payer: Self-pay

## 2016-08-11 MED ORDER — FLUTICASONE PROPIONATE 50 MCG/ACT NA SUSP: 2.0000 | Freq: Every day | NASAL | 11 refills | Status: DC

## 2016-08-14 ENCOUNTER — Telehealth: Payer: Self-pay

## 2016-08-14 ENCOUNTER — Ambulatory Visit (INDEPENDENT_AMBULATORY_CARE_PROVIDER_SITE_OTHER): Payer: Medicare Other | Admitting: Gastroenterology

## 2016-08-14 ENCOUNTER — Encounter: Payer: Self-pay | Admitting: Gastroenterology

## 2016-08-14 ENCOUNTER — Other Ambulatory Visit: Payer: Self-pay

## 2016-08-14 VITALS — BP 124/70 | HR 91 | Temp 98.3°F | Ht 59.0 in | Wt 192.0 lb

## 2016-08-14 DIAGNOSIS — G8929 Other chronic pain: Secondary | ICD-10-CM

## 2016-08-14 DIAGNOSIS — R197 Diarrhea, unspecified: Secondary | ICD-10-CM | POA: Diagnosis not present

## 2016-08-14 DIAGNOSIS — R1013 Epigastric pain: Secondary | ICD-10-CM

## 2016-08-14 NOTE — Progress Notes (Signed)
Primary Care Physician: Enid Derry, MD  Primary Gastroenterologist:  Dr. [name]  Chief Complaint  Patient presents with  . Diarrhea  . Abdominal Pain    HPI: [name] is a 50 y.o. female here for diarrhea and abdominal pain. The patient has a history of constipation and was seen by me and put on Linzess. The patient reports that she has diarrhea now and when she doesn't take Linzess she has constipation. The patient is on 145 mg per day. The patient also reports that she has epigastric pain. The patient was found to have a large amount of food in her stomach during the endoscopy but her gastric emptying study showed her to have normal gastric emptying. The patient also had adenomatous polyps at the time of colonoscopy was reported to be told to have another colonoscopy in 5 years. She reports that the epigastric pain is worse when she eats and when she lays down at night. There is no report of any unexplained weight loss, fevers, chills, black stools or bloody stools.  Current Outpatient Prescriptions  Medication Sig Dispense Refill  . ACCU-CHEK AVIVA PLUS test strip Check FSBS three times a day; LON 99 months, DX E11.40 100 each 3  . ACCU-CHEK SOFTCLIX LANCETS lancets Check FSBS three times a day; LON 99 months, DX E11.40 100 each 3  . albuterol (PROVENTIL HFA;VENTOLIN HFA) 108 (90 Base) MCG/ACT inhaler Inhale 2 puffs into the lungs every 4 (four) hours as needed for wheezing or shortness of breath. 1 Inhaler 1  . amLODipine (NORVASC) 2.5 MG tablet Take 1 tablet (2.5 mg total) by mouth daily. 30 tablet 5  . atorvastatin (LIPITOR) 40 MG tablet Take 1 tablet (40 mg total) by mouth at bedtime. 30 tablet 1  . cyclobenzaprine (FLEXERIL) 10 MG tablet Take 1 tablet (10 mg total) by mouth every 8 (eight) hours as needed for muscle spasms. 60 tablet 1  . DULoxetine (CYMBALTA) 60 MG capsule Take 120 mg by mouth daily.    . fluticasone (FLONASE) 50 MCG/ACT nasal spray Place 2 sprays into  both nostrils daily. 16 g 11  . Icosapent Ethyl (VASCEPA) 1 g CAPS Take 2 capsules by mouth 2 (two) times daily. For cholesterol; stop fenofibrate 120 capsule 11  . insulin lispro (HUMALOG) 100 UNIT/ML injection Per endo: Sliding scale goes up by 5 units every 50; with meals 70-150=30units 151-200=35units 201-250=40units 251-300=45units 301-350=50units 351-400=55units 401-450=60units 60 mL 0  . insulin NPH Human (HUMULIN N,NOVOLIN N) 100 UNIT/ML injection Per endo: 75 units in am, 50 units at lunch and 75 with supper; subscutaneous 60 mL 1  . Insulin Syringe-Needle U-100 (ULTICARE INSULIN SYRINGE) 31G X 1/4" 0.3 ML MISC Inject 1 each into the skin 5 (five) times daily as needed. 200 each 0  . lamoTRIgine (LAMICTAL) 150 MG tablet Take 150 mg by mouth daily.     Marland Kitchen levothyroxine (SYNTHROID, LEVOTHROID) 75 MCG tablet Take 1 tablet (75 mcg total) by mouth daily. 30 tablet 5  . LINZESS 145 MCG CAPS capsule Take 145 mcg by mouth daily.     . magnesium oxide (MAG-OX) 400 MG tablet Take 1 tablet (400 mg total) by mouth 3 (three) times daily. 90 tablet 2  . metoprolol tartrate (LOPRESSOR) 25 MG tablet Take 0.5 tablets (12.5 mg total) by mouth 2 (two) times daily. 30 tablet 11  . ondansetron (ZOFRAN ODT) 4 MG disintegrating tablet Take 1 tablet (4 mg total) by mouth every 8 (eight) hours as needed for nausea  or vomiting. 20 tablet 0  . QUEtiapine (SEROQUEL) 25 MG tablet Take 25 mg by mouth every morning. Take one in am, one at lunch    . QUEtiapine (SEROQUEL) 400 MG tablet Take 800 mg by mouth at bedtime. Take 2 tabs at bedtime    . ranitidine (ZANTAC) 300 MG tablet Take 1 tablet (300 mg total) by mouth at bedtime. 30 tablet 5  . REXULTI 1 MG TABS Take 1 mg by mouth daily.     Marland Kitchen zolpidem (AMBIEN) 10 MG tablet Take 10 mg by mouth at bedtime.      No current facility-administered medications for this visit.     Allergies as of 08/14/2016 - Review Complete 08/14/2016  Allergen Reaction Noted  .  Morphine and related  10/20/2013  . Codeine    . Codeine  10/20/2013  . Hydrocodone-acetaminophen    . Mirtazapine    . Morphine and related  01/20/2015  . Sulfamethoxazole-trimethoprim  06/09/2008  . Vicodin [hydrocodone-acetaminophen]  10/20/2013    ROS:  General: Negative for anorexia, weight loss, fever, chills, fatigue, weakness. ENT: Negative for hoarseness, difficulty swallowing , nasal congestion. CV: Negative for chest pain, angina, palpitations, dyspnea on exertion, peripheral edema.  Respiratory: Negative for dyspnea at rest, dyspnea on exertion, cough, sputum, wheezing.  GI: See history of present illness. GU:  Negative for dysuria, hematuria, urinary incontinence, urinary frequency, nocturnal urination.  Endo: Negative for unusual weight change.    Physical Examination:   BP 124/70   Pulse 91   Temp 98.3 F (36.8 C) (Oral)   Ht 4' 11"  (1.499 m)   Wt 192 lb (87.1 kg)   BMI 38.78 kg/m   General: Well-nourished, well-developed in no acute distress.  Eyes: No icterus. Conjunctivae pink. Mouth: Oropharyngeal mucosa moist and pink , no lesions erythema or exudate. Lungs: Clear to auscultation bilaterally. Non-labored. Heart: Regular rate and rhythm, no murmurs rubs or gallops.  Abdomen: Bowel sounds are normal, positive epigastric tenderness, nondistended, no hepatosplenomegaly or masses, no abdominal bruits or hernia , no rebound or guarding.   Extremities: No lower extremity edema. No clubbing or deformities. Neuro: Alert and oriented x 3.  Grossly intact. Skin: Warm and dry, no jaundice.   Psych: Alert and cooperative, normal mood and affect.  Labs:    Imaging Studies: No results found.  Assessment and Plan:   [name] is a 50 y.o. y/o female who comes in today with diarrhea when she takes her Linzess and then constipation when she stops. The patient will be put on a lower dose of her Linzess. The patient will also be started on a PPI because of her  dyspepsia and epigastric tenderness most likely related to acid. The patient has been explained the plan and agrees with it.    [name], MD. Marval Regal   Note: This dictation was prepared with Dragon dictation along with smaller phrase technology. Any transcriptional errors that result from this process are unintentional.

## 2016-08-14 NOTE — Telephone Encounter (Signed)
Patient pharm requested a for patient to have refill on her insulin syringe. Called the pharm and spoke with a pharm tech because I saw Dr. lada approve for her to have 200 on the 07/31/2016. She stated she didn't see it. Systems shows it was sent therefore did a verbal approval on the phone.

## 2016-08-16 ENCOUNTER — Telehealth: Payer: Self-pay | Admitting: Gastroenterology

## 2016-08-16 NOTE — Telephone Encounter (Signed)
Patient is waiting on a RX  Linzess. She stated it Dr. Allen Norris was going to cut it in half and something for reflux

## 2016-08-17 NOTE — Telephone Encounter (Signed)
Tried contacting pt but vm box was not set up.

## 2016-08-19 DIAGNOSIS — G4733 Obstructive sleep apnea (adult) (pediatric): Secondary | ICD-10-CM | POA: Diagnosis not present

## 2016-08-19 DIAGNOSIS — J449 Chronic obstructive pulmonary disease, unspecified: Secondary | ICD-10-CM | POA: Diagnosis not present

## 2016-08-19 DIAGNOSIS — R0902 Hypoxemia: Secondary | ICD-10-CM | POA: Diagnosis not present

## 2016-08-21 ENCOUNTER — Telehealth: Payer: Self-pay

## 2016-08-21 MED ORDER — PANTOPRAZOLE SODIUM 40 MG PO TBEC: 40.0000 mg | DELAYED_RELEASE_TABLET | Freq: Every day | ORAL | 3 refills | Status: DC

## 2016-08-21 MED ORDER — LINACLOTIDE 72 MCG PO CAPS: 72.0000 ug | ORAL_CAPSULE | Freq: Every day | ORAL | 3 refills | Status: DC

## 2016-08-21 NOTE — Telephone Encounter (Signed)
Pt returned called and gave pharmacy name. Rx's sent to [facility].

## 2016-08-21 NOTE — Telephone Encounter (Signed)
Patient called in requesting medications to be sent to pharmacy. Pharmacy verified. Medications sent per Dr. Dorothey Baseman orders.

## 2016-08-25 ENCOUNTER — Other Ambulatory Visit: Payer: Self-pay | Admitting: Pharmacist

## 2016-08-25 NOTE — Patient Outreach (Signed)
Outreach call to [employer] regarding medication adherence to atorvastatin. Call patient but receive a message that patient has a voicemail box that has not yet been setup.  Harlow Asa, PharmD, [facility] [phone]

## 2016-09-06 ENCOUNTER — Other Ambulatory Visit: Payer: Self-pay

## 2016-09-06 MED ORDER — "INSULIN SYRINGE-NEEDLE U-100 31G X 1/4"" 0.3 ML MISC": 1.0000 | Freq: Every day | 0 refills | Status: DC | PRN

## 2016-09-06 NOTE — Telephone Encounter (Signed)
I'm going to lay down some tough love Please call the patient Let her know that I absolutely must insist that she see an endocrinologist I don't know if she realizes how dangerous her condition is I talked with her about this in August If she has burned a bridge with the local endocrinologist because of repeated no shows, then please enter referral to any endocrinologist nearby who will see her; she has triglycerides in the thousands and uncontrolled diabetes and we really need her to work with a specialist I will refill her needles this time, but must insist that she go to an endocrinologist for her diabetes and cholesterol going forward

## 2016-09-06 NOTE — Telephone Encounter (Signed)
Patient informed and has an appt tomorrow will discuss then.

## 2016-09-07 ENCOUNTER — Encounter: Payer: Self-pay | Admitting: Family Medicine

## 2016-09-12 ENCOUNTER — Telehealth: Payer: Self-pay

## 2016-09-12 NOTE — Telephone Encounter (Signed)
Rn call [facility] [facility] about refill for topiramate. Rn spoke with Estill Bamberg that pt has a balance at [facility] and does not have any follow up appts at [place].Pt was last seen 03/2015. Estill Bamberg stated the refill request is sent by the pt. Rn stated the medication will be denied. Estill Bamberg verbalized understanding at the pharmacy.

## 2016-09-13 ENCOUNTER — Other Ambulatory Visit: Payer: Self-pay

## 2016-09-13 DIAGNOSIS — Z79899 Other long term (current) drug therapy: Secondary | ICD-10-CM

## 2016-09-13 DIAGNOSIS — E781 Pure hyperglyceridemia: Secondary | ICD-10-CM

## 2016-09-13 NOTE — Telephone Encounter (Signed)
I dont think she is due for labs?  Last chol was in aug not sure if she wants every 3 months or 6?

## 2016-09-13 NOTE — Telephone Encounter (Signed)
She needs to have it rechecked.  Lipid panel and comp panel

## 2016-09-13 NOTE — Telephone Encounter (Signed)
Left patient detailed voicemail about needing labs

## 2016-09-22 ENCOUNTER — Telehealth: Payer: Self-pay

## 2016-09-22 ENCOUNTER — Telehealth: Payer: Self-pay | Admitting: Family Medicine

## 2016-09-22 DIAGNOSIS — E1165 Type 2 diabetes mellitus with hyperglycemia: Secondary | ICD-10-CM

## 2016-09-22 DIAGNOSIS — Z794 Long term (current) use of insulin: Principal | ICD-10-CM

## 2016-09-22 MED ORDER — OMEGA-3-ACID ETHYL ESTERS 1 G PO CAPS: 2.0000 g | ORAL_CAPSULE | Freq: Two times a day (BID) | ORAL | 5 refills | Status: DC

## 2016-09-22 MED ORDER — INSULIN NPH (HUMAN) (ISOPHANE) 100 UNIT/ML ~~LOC~~ SUSP: SUBCUTANEOUS | 1 refills | Status: DC

## 2016-09-22 MED ORDER — "INSULIN SYRINGE-NEEDLE U-100 31G X 1/4"" 0.3 ML MISC": 1.0000 | Freq: Every day | 0 refills | Status: DC | PRN

## 2016-09-22 NOTE — Telephone Encounter (Signed)
Pharmacy called states valscepa is not covered with her ins. And the savings card will not work.

## 2016-09-22 NOTE — Telephone Encounter (Signed)
I changed Rx to lovaza

## 2016-09-22 NOTE — Telephone Encounter (Signed)
I am just worried about her; I called and spoke to her; she has not had any insulin for a week or so Vascepa not covered by insurance; she thinks lovaza is covered; Rx sent I told her I'm just worried, urged her to get in to see endo; she says she will call Monday appt with me soon

## 2016-09-25 ENCOUNTER — Other Ambulatory Visit: Payer: Self-pay

## 2016-09-25 DIAGNOSIS — E781 Pure hyperglyceridemia: Secondary | ICD-10-CM

## 2016-09-25 MED ORDER — LEVOTHYROXINE SODIUM 75 MCG PO TABS: 75.0000 ug | ORAL_TABLET | Freq: Every day | ORAL | 0 refills | Status: DC

## 2016-09-25 MED ORDER — ATORVASTATIN CALCIUM 40 MG PO TABS: 40.0000 mg | ORAL_TABLET | Freq: Every day | ORAL | 0 refills | Status: DC

## 2016-09-25 NOTE — Telephone Encounter (Signed)
Labs and appt needed

## 2016-09-28 ENCOUNTER — Encounter: Payer: Self-pay | Admitting: Family Medicine

## 2016-09-28 ENCOUNTER — Other Ambulatory Visit: Payer: Self-pay | Admitting: Family Medicine

## 2016-09-28 ENCOUNTER — Ambulatory Visit (INDEPENDENT_AMBULATORY_CARE_PROVIDER_SITE_OTHER): Payer: Medicare HMO | Admitting: Family Medicine

## 2016-09-28 VITALS — BP 122/82 | HR 99 | Temp 98.5°F | Resp 14 | Ht 59.0 in | Wt 192.6 lb

## 2016-09-28 DIAGNOSIS — E038 Other specified hypothyroidism: Secondary | ICD-10-CM

## 2016-09-28 DIAGNOSIS — Z124 Encounter for screening for malignant neoplasm of cervix: Secondary | ICD-10-CM | POA: Diagnosis not present

## 2016-09-28 DIAGNOSIS — Z794 Long term (current) use of insulin: Secondary | ICD-10-CM

## 2016-09-28 DIAGNOSIS — R350 Frequency of micturition: Secondary | ICD-10-CM | POA: Diagnosis not present

## 2016-09-28 DIAGNOSIS — Z23 Encounter for immunization: Secondary | ICD-10-CM | POA: Diagnosis not present

## 2016-09-28 DIAGNOSIS — G2581 Restless legs syndrome: Secondary | ICD-10-CM

## 2016-09-28 DIAGNOSIS — D485 Neoplasm of uncertain behavior of skin: Secondary | ICD-10-CM | POA: Insufficient documentation

## 2016-09-28 DIAGNOSIS — E782 Mixed hyperlipidemia: Secondary | ICD-10-CM

## 2016-09-28 DIAGNOSIS — Z1231 Encounter for screening mammogram for malignant neoplasm of breast: Secondary | ICD-10-CM | POA: Diagnosis not present

## 2016-09-28 DIAGNOSIS — Z1239 Encounter for other screening for malignant neoplasm of breast: Secondary | ICD-10-CM | POA: Insufficient documentation

## 2016-09-28 DIAGNOSIS — E1165 Type 2 diabetes mellitus with hyperglycemia: Secondary | ICD-10-CM | POA: Diagnosis not present

## 2016-09-28 DIAGNOSIS — IMO0002 Reserved for concepts with insufficient information to code with codable children: Secondary | ICD-10-CM

## 2016-09-28 DIAGNOSIS — E1140 Type 2 diabetes mellitus with diabetic neuropathy, unspecified: Secondary | ICD-10-CM | POA: Diagnosis not present

## 2016-09-28 DIAGNOSIS — Z0000 Encounter for general adult medical examination without abnormal findings: Secondary | ICD-10-CM | POA: Insufficient documentation

## 2016-09-28 DIAGNOSIS — E781 Pure hyperglyceridemia: Secondary | ICD-10-CM | POA: Diagnosis not present

## 2016-09-28 LAB — CBC WITH DIFFERENTIAL/PLATELET
BASOS PCT: 0 %
Basophils Absolute: 0 cells/uL (ref 0–200)
Eosinophils Absolute: 150 cells/uL (ref 15–500)
Eosinophils Relative: 2 %
HEMATOCRIT: 41.9 % (ref 35.0–45.0)
HEMOGLOBIN: 13.5 g/dL (ref 11.7–15.5)
LYMPHS ABS: 3225 {cells}/uL (ref 850–3900)
Lymphocytes Relative: 43 %
MCH: 28.5 pg (ref 27.0–33.0)
MCHC: 32.2 g/dL (ref 32.0–36.0)
MCV: 88.4 fL (ref 80.0–100.0)
MONO ABS: 525 {cells}/uL (ref 200–950)
MPV: 9.3 fL (ref 7.5–12.5)
Monocytes Relative: 7 %
NEUTROS ABS: 3600 {cells}/uL (ref 1500–7800)
NEUTROS PCT: 48 %
Platelets: 227 10*3/uL (ref 140–400)
RBC: 4.74 MIL/uL (ref 3.80–5.10)
RDW: 13.9 % (ref 11.0–15.0)
WBC: 7.5 10*3/uL (ref 3.8–10.8)

## 2016-09-28 LAB — TSH: TSH: 0.77 m[IU]/L

## 2016-09-28 MED ORDER — INSULIN LISPRO 100 UNIT/ML ~~LOC~~ SOLN: SUBCUTANEOUS | 1 refills | Status: DC

## 2016-09-28 MED ORDER — ROPINIROLE HCL 0.25 MG PO TABS: ORAL_TABLET | ORAL | 0 refills | Status: DC

## 2016-09-28 NOTE — Patient Instructions (Addendum)
Check out the information at familydoctor.org entitled "Nutrition for Weight Loss: What You Need to Know about Fad Diets" Try to lose between 1-2 pounds per week by taking in fewer calories and burning off more calories You can succeed by limiting portions, limiting foods dense in calories and fat, becoming more active, and drinking 8 glasses of water a day (64 ounces) Don't skip meals, especially breakfast, as skipping meals may alter your metabolism Do not use over-the-counter weight loss pills or gimmicks that claim rapid weight loss A healthy BMI (or body mass index) is between 18.5 and 24.9 You can calculate your ideal BMI at the NIH website ClubMonetize.fr Try to limit saturated fats in your diet (bologna, hot dogs, barbeque, cheeseburgers, hamburgers, steak, bacon, sausage, cheese, etc.) and get more fresh fruits, vegetables, and whole grains Please do call to schedule your mammogram; the number to schedule one at either [facility] or [facility] is [phone]   Health Maintenance, Female Introduction Adopting a healthy lifestyle and getting preventive care can go a long way to promote health and wellness. Talk with your health care provider about what schedule of regular examinations is right for you. This is a good chance for you to check in with your provider about disease prevention and staying healthy. In between checkups, there are plenty of things you can do on your own. Experts have done a lot of research about which lifestyle changes and preventive measures are most likely to keep you healthy. Ask your health care provider for more information. Weight and diet Eat a healthy diet  Be sure to include plenty of vegetables, fruits, low-fat dairy products, and lean protein.  Do not eat a lot of foods high in solid fats, added sugars, or salt.  Get regular exercise. This is one of the most important  things you can do for your health.  Most adults should exercise for at least 150 minutes each week. The exercise should increase your heart rate and make you sweat (moderate-intensity exercise).  Most adults should also do strengthening exercises at least twice a week. This is in addition to the moderate-intensity exercise. Maintain a healthy weight  Body mass index (BMI) is a measurement that can be used to identify possible weight problems. It estimates body fat based on height and weight. Your health care provider can help determine your BMI and help you achieve or maintain a healthy weight.  For females 63 years of age and older:  A BMI below 18.5 is considered underweight.  A BMI of 18.5 to 24.9 is normal.  A BMI of 25 to 29.9 is considered overweight.  A BMI of 30 and above is considered obese. Watch levels of cholesterol and blood lipids  You should start having your blood tested for lipids and cholesterol at 53 years of age, then have this test every 5 years.  You may need to have your cholesterol levels checked more often if:  Your lipid or cholesterol levels are high.  You are older than 53 years of age.  You are at high risk for heart disease. Cancer screening Lung Cancer  Lung cancer screening is recommended for adults 70-50 years old who are at high risk for lung cancer because of a history of smoking.  A yearly low-dose CT scan of the lungs is recommended for people who:  Currently smoke.  Have quit within the past 15 years.  Have at least a 30-pack-year history of smoking. A pack year is  smoking an average of one pack of cigarettes a day for 1 year.  Yearly screening should continue until it has been 15 years since you quit.  Yearly screening should stop if you develop a health problem that would prevent you from having lung cancer treatment. Breast Cancer  Practice breast self-awareness. This means understanding how your breasts normally appear and  feel.  It also means doing regular breast self-exams. Let your health care provider know about any changes, no matter how small.  If you are in your 20s or 30s, you should have a clinical breast exam (CBE) by a health care provider every 1-3 years as part of a regular health exam.  If you are 74 or older, have a CBE every year. Also consider having a breast X-ray (mammogram) every year.  If you have a family history of breast cancer, talk to your health care provider about genetic screening.  If you are at high risk for breast cancer, talk to your health care provider about having an MRI and a mammogram every year.  Breast cancer gene (BRCA) assessment is recommended for women who have family members with BRCA-related cancers. BRCA-related cancers include:  Breast.  Ovarian.  Tubal.  Peritoneal cancers.  Results of the assessment will determine the need for genetic counseling and BRCA1 and BRCA2 testing. Cervical Cancer  Your health care provider may recommend that you be screened regularly for cancer of the pelvic organs (ovaries, uterus, and vagina). This screening involves a pelvic examination, including checking for microscopic changes to the surface of your cervix (Pap test). You may be encouraged to have this screening done every 3 years, beginning at age 42.  For women ages 40-65, health care providers may recommend pelvic exams and Pap testing every 3 years, or they may recommend the Pap and pelvic exam, combined with testing for human papilloma virus (HPV), every 5 years. Some types of HPV increase your risk of cervical cancer. Testing for HPV may also be done on women of any age with unclear Pap test results.  Other health care providers may not recommend any screening for nonpregnant women who are considered low risk for pelvic cancer and who do not have symptoms. Ask your health care provider if a screening pelvic exam is right for you.  If you have had past treatment for  cervical cancer or a condition that could lead to cancer, you need Pap tests and screening for cancer for at least 20 years after your treatment. If Pap tests have been discontinued, your risk factors (such as having a new sexual partner) need to be reassessed to determine if screening should resume. Some women have medical problems that increase the chance of getting cervical cancer. In these cases, your health care provider may recommend more frequent screening and Pap tests. Colorectal Cancer  This type of cancer can be detected and often prevented.  Routine colorectal cancer screening usually begins at 53 years of age and continues through 53 years of age.  Your health care provider may recommend screening at an earlier age if you have risk factors for colon cancer.  Your health care provider may also recommend using home test kits to check for hidden blood in the stool.  A small camera at the end of a tube can be used to examine your colon directly (sigmoidoscopy or colonoscopy). This is done to check for the earliest forms of colorectal cancer.  Routine screening usually begins at age 77.  Direct examination of the  colon should be repeated every 5-10 years through 53 years of age. However, you may need to be screened more often if early forms of precancerous polyps or small growths are found. Skin Cancer  Check your skin from head to toe regularly.  Tell your health care provider about any new moles or changes in moles, especially if there is a change in a mole's shape or color.  Also tell your health care provider if you have a mole that is larger than the size of a pencil eraser.  Always use sunscreen. Apply sunscreen liberally and repeatedly throughout the day.  Protect yourself by wearing long sleeves, pants, a wide-brimmed hat, and sunglasses whenever you are outside. Heart disease, diabetes, and high blood pressure  High blood pressure causes heart disease and increases the  risk of stroke. High blood pressure is more likely to develop in:  People who have blood pressure in the high end of the normal range (130-139/85-89 mm Hg).  People who are overweight or obese.  People who are African American.  If you are 32-68 years of age, have your blood pressure checked every 3-5 years. If you are 51 years of age or older, have your blood pressure checked every year. You should have your blood pressure measured twice-once when you are at a hospital or clinic, and once when you are not at a hospital or clinic. Record the average of the two measurements. To check your blood pressure when you are not at a hospital or clinic, you can use:  An automated blood pressure machine at a pharmacy.  A home blood pressure monitor.  If you are between 44 years and 16 years old, ask your health care provider if you should take aspirin to prevent strokes.  Have regular diabetes screenings. This involves taking a blood sample to check your fasting blood sugar level.  If you are at a normal weight and have a low risk for diabetes, have this test once every three years after 53 years of age.  If you are overweight and have a high risk for diabetes, consider being tested at a younger age or more often. Preventing infection Hepatitis B  If you have a higher risk for hepatitis B, you should be screened for this virus. You are considered at high risk for hepatitis B if:  You were born in a country where hepatitis B is common. Ask your health care provider which countries are considered high risk.  Your parents were born in a high-risk country, and you have not been immunized against hepatitis B (hepatitis B vaccine).  You have HIV or AIDS.  You use needles to inject street drugs.  You live with someone who has hepatitis B.  You have had sex with someone who has hepatitis B.  You get hemodialysis treatment.  You take certain medicines for conditions, including cancer, organ  transplantation, and autoimmune conditions. Hepatitis C  Blood testing is recommended for:  Everyone born from [birth year] through [birth year].  Anyone with known risk factors for hepatitis C. Sexually transmitted infections (STIs)  You should be screened for sexually transmitted infections (STIs) including gonorrhea and chlamydia if:  You are sexually active and are younger than 53 years of age.  You are older than 53 years of age and your health care provider tells you that you are at risk for this type of infection.  Your sexual activity has changed since you were last screened and you are at an increased risk for chlamydia  or gonorrhea. Ask your health care provider if you are at risk.  If you do not have HIV, but are at risk, it may be recommended that you take a prescription medicine daily to prevent HIV infection. This is called pre-exposure prophylaxis (PrEP). You are considered at risk if:  You are sexually active and do not regularly use condoms or know the HIV status of your partner(s).  You take drugs by injection.  You are sexually active with a partner who has HIV. Talk with your health care provider about whether you are at high risk of being infected with HIV. If you choose to begin PrEP, you should first be tested for HIV. You should then be tested every 3 months for as long as you are taking PrEP. Pregnancy  If you are premenopausal and you may become pregnant, ask your health care provider about preconception counseling.  If you may become pregnant, take 400 to 800 micrograms (mcg) of folic acid every day.  If you want to prevent pregnancy, talk to your health care provider about birth control (contraception). Osteoporosis and menopause  Osteoporosis is a disease in which the bones lose minerals and strength with aging. This can result in serious bone fractures. Your risk for osteoporosis can be identified using a bone density scan.  If you are 55 years of age or older, or  if you are at risk for osteoporosis and fractures, ask your health care provider if you should be screened.  Ask your health care provider whether you should take a calcium or vitamin D supplement to lower your risk for osteoporosis.  Menopause may have certain physical symptoms and risks.  Hormone replacement therapy may reduce some of these symptoms and risks. Talk to your health care provider about whether hormone replacement therapy is right for you. Follow these instructions at home:  Schedule regular health, dental, and eye exams.  Stay current with your immunizations.  Do not use any tobacco products including cigarettes, chewing tobacco, or electronic cigarettes.  If you are pregnant, do not drink alcohol.  If you are breastfeeding, limit how much and how often you drink alcohol.  Limit alcohol intake to no more than 1 drink per day for nonpregnant women. One drink equals 12 ounces of beer, 5 ounces of wine, or 1 ounces of hard liquor.  Do not use street drugs.  Do not share needles.  Ask your health care provider for help if you need support or information about quitting drugs.  Tell your health care provider if you often feel depressed.  Tell your health care provider if you have ever been abused or do not feel safe at home. This information is not intended to replace advice given to you by your health care provider. Make sure you discuss any questions you have with your health care provider. Document Released: 03/06/2011 Document Revised: 01/27/2016 Document Reviewed: 05/25/2015  2017 Elsevier

## 2016-09-28 NOTE — Assessment & Plan Note (Signed)
Check labs 

## 2016-09-28 NOTE — Assessment & Plan Note (Signed)
Order mammo

## 2016-09-28 NOTE — Assessment & Plan Note (Signed)
Thin prep collected

## 2016-09-28 NOTE — Assessment & Plan Note (Signed)
Refer to dermatologist 

## 2016-09-28 NOTE — Assessment & Plan Note (Signed)
Check urine today 

## 2016-09-28 NOTE — Progress Notes (Signed)
Patient ID: [name], female   DOB: [birth year], 51 y.o.   MRN: [id number]   Subjective:   [name] is a 51 y.o. female here for a complete physical exam  Interim issues since last visit: sore on the ear; eye doctor worried she is having strokes in her left eye; wanted her to see a specialist but just didn't go; she'll go now with new insurance Going to see Dr. Francoise Schaumann soon for diabetes and high cholesterol; she was referred to endocrinology a while back, but there was an issue of compliance and prior missed visits, so she wasn't seen; patient had very high TG and uncontrolled diabetes, with high A1c; she is on high doses of insulin Restless legs still a problem  USPSTF grade A and B recommendations Alcohol: no Depression:  Depression screen [facility] 2/9 09/28/2016 03/14/2016 01/06/2016  Decreased Interest 0 0 0  Down, Depressed, Hopeless 0 0 1  PHQ - 2 Score 0 0 1  Some recent data might be hidden   Hypertension: controlled Obesity: working on it, but has no will power she admits; gets silver sneakers and has transportation problems Tobacco use:  HIV, hep B, hep C:  STD testing and prevention (chl/gon/syphilis):  Lipids: today Glucose: today Colorectal cancer: just saw Dr. Allen Norris; she does not need another until 2025 Breast cancer: order mammo Intimate partner violence: no Cervical cancer screening: not sure if cervix is there; will do pap smear  Past Medical History:  Diagnosis Date  . Asthma   . Bipolar depression ([place])   . CHF (congestive heart failure) ([place])   . Chronic headaches   . COPD (chronic obstructive pulmonary disease) ([place])   . Diabetic neuropathy ([place])   . GERD (gastroesophageal reflux disease)   . Headache   . Heart attack   . Hyperlipidemia   . Hypertension   . Hypomagnesemia   . Hypothyroid   . Irregular heart rhythm   . Liver lesion    Favored to be benign on CT; MRI of abd w/contract in Aug 2016. Liver Biopsy-Negative, March 2016  . Morbid  obesity ([place])   . OSA (obstructive sleep apnea)   . Pinched nerve    Back  . RLS (restless legs syndrome)   . Seasonal allergies   . Sleep apnea   . Type 2 diabetes, uncontrolled, with neuropathy ([place]) 10/15/2015   Overview:  Microalbumin 7.7 12/2010: Hgb A1c 10.2% 12/2010 and 9.7% 03/2011, eye exam 10/2009 [facility]   . Urinary incontinence   . Vitamin D deficiency    Past Surgical History:  Procedure Laterality Date  . ABDOMINAL HYSTERECTOMY    . ABDOMINAL HYSTERECTOMY     Partial  . BLADDER SURGERY     x 2  . CHOLECYSTECTOMY    . LIVER BIOPSY  March 2016   Negative  . LUNG BIOPSY    . TUBAL LIGATION     Family History  Problem Relation Age of Onset  . Diabetes Mother   . Heart disease Mother   . Hyperlipidemia Mother   . Hypertension Mother   . Kidney disease Mother   . Mental illness Mother   . Hypothyroidism Mother   . Stroke Mother   . Osteoporosis Mother   . Glaucoma Mother   . Congestive Heart Failure Mother   . Hypertension Father   . Asthma Daughter   . Cancer Daughter     throat  . Heart disease Maternal Grandfather   . Rheum arthritis Maternal  Grandfather   . Cancer Maternal Grandfather     liver  . Cancer Paternal Grandmother     lung   Social History  Substance Use Topics  . Smoking status: Never Smoker  . Smokeless tobacco: Never Used  . Alcohol use No   Review of Systems  Objective:   Vitals:   09/28/16 1348  BP: 122/82  Pulse: 99  Resp: 14  Temp: 98.5 F (36.9 C)  TempSrc: Oral  SpO2: 97%  Weight: 192 lb 9.6 oz (87.4 kg)  Height: 4' 11"  (1.499 m)   Body mass index is 38.9 kg/m. Wt Readings from Last 3 Encounters:  09/28/16 192 lb 9.6 oz (87.4 kg)  08/14/16 192 lb (87.1 kg)  06/17/16 191 lb (86.6 kg)   Physical Exam  Constitutional: She appears well-developed and well-nourished.  Obese, weight stable  HENT:  Head: Normocephalic and atraumatic.    Lesion on the lateral aspect of antehelix/helix  Eyes: Conjunctivae and  EOM are normal. Right eye exhibits no hordeolum. Left eye exhibits no hordeolum. No scleral icterus.  Neck: No thyromegaly present.  Cardiovascular: Normal rate, regular rhythm, S1 normal, S2 normal and normal heart sounds.   No extrasystoles are present.  Pulmonary/Chest: Effort normal and breath sounds normal. No respiratory distress. Right breast exhibits no inverted nipple, no mass, no nipple discharge, no skin change and no tenderness. Left breast exhibits no inverted nipple, no mass, no nipple discharge, no skin change and no tenderness. Breasts are symmetrical.  Abdominal: Soft. Normal appearance and bowel sounds are normal. She exhibits no distension, no abdominal bruit, no pulsatile midline mass and no mass. There is no hepatosplenomegaly. There is no tenderness. No hernia.  Genitourinary: Uterus normal. Pelvic exam was performed with patient prone. There is no rash or lesion on the right labia. There is no rash or lesion on the left labia. Cervix exhibits no motion tenderness. Right adnexum displays no mass, no tenderness and no fullness. Left adnexum displays no mass, no tenderness and no fullness.  Musculoskeletal: Normal range of motion. She exhibits no edema.  Lymphadenopathy:       Head (right side): No submandibular adenopathy present.       Head (left side): No submandibular adenopathy present.    She has no cervical adenopathy.    She has no axillary adenopathy.  Neurological: She is alert. She displays no tremor. No cranial nerve deficit. She exhibits normal muscle tone. Gait normal.  Skin: Skin is warm and dry. No bruising and no ecchymosis noted. No cyanosis. No pallor.  Psychiatric: Her speech is normal and behavior is normal. Thought content normal. Her mood appears not anxious. She does not exhibit a depressed mood.   Diabetic Foot Form - Detailed   Diabetic Foot Exam - detailed Diabetic Foot exam was performed with the following findings:  Yes 09/28/2016  5:07 PM  Visual  Foot Exam completed.:  Yes  Are the toenails ingrown?:  No Normal Range of Motion:  Yes Pulse Foot Exam completed.:  Yes  Right Dorsalis Pedis:  Present Left Dorsalis Pedis:  Present  Sensory Foot Exam Completed.:  Yes Swelling:  No Semmes-Weinstein Monofilament Test R Site 1-Great Toe:  Pos L Site 1-Great Toe:  Pos        Assessment/Plan:   Problem List Items Addressed This Visit      Endocrine   Type 2 diabetes, uncontrolled, with neuropathy ([place])    Check labs      Relevant Orders   Lipid  panel (Completed)   Hemoglobin A1c (Completed)   Microalbumin / creatinine urine ratio (Completed)   Hypothyroid    Check labs      Relevant Orders   TSH (Completed)   Diabetes ([place])   Relevant Orders   Lipid panel (Completed)   Hemoglobin A1c (Completed)   Microalbumin / creatinine urine ratio (Completed)     Nervous and Auditory   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    Refer to dermatologist      Relevant Orders   Ambulatory referral to Dermatology     Other   Urine frequency    Check urine today      Relevant Orders   Urinalysis w microscopic + reflex cultur (Completed)   Restless leg syndrome    Start ropinirole      Preventative health care - Primary    USPSTF grade A and B recommendations reviewed with patient; age-appropriate recommendations, preventive care, screening tests, etc discussed and encouraged; healthy living encouraged; see AVS for patient education given to patient       Relevant Orders   CBC with Differential/Platelet (Completed)   Magnesium (Completed)   COMPLETE METABOLIC PANEL WITH GFR (Completed)   Hypomagnesemia    Check labs      Relevant Orders   Magnesium (Completed)   HYPERTRIGLYCERIDEMIA    Check labs      Relevant Orders   Lipid panel (Completed)   HLD (hyperlipidemia)    Check labs      Relevant Orders   Lipid panel (Completed)   Lipid panel (Completed)   Encounter for screening for cervical cancer      Thin prep collected      Relevant Orders   Pap IG and HPV (high risk) DNA detection   Breast cancer screening    Order mammo      Relevant Orders   MM DIGITAL SCREENING BILATERAL    Other Visit Diagnoses    Needs flu shot       Relevant Orders   Flu Vaccine QUAD 36+ mos PF IM (Fluarix & Fluzone Quad PF) (Completed)       Meds ordered this encounter  Medications  . DISCONTD: insulin lispro (HUMALOG) 100 UNIT/ML injection    Sig: Per endo: Sliding scale goes up by 5 units every 50; with meals 70-150=30units 151-200=35units 201-250=40units 251-300=45units 301-350=50units 351-400=55units 401-450=60units    Dispense:  60 mL    Refill:  1  . rOPINIRole (REQUIP) 0.25 MG tablet    Sig: One by mouth at bedtime x 1 week, then two pills at bedtime    Dispense:  53 tablet    Refill:  0   Orders Placed This Encounter  Procedures  . MM DIGITAL SCREENING BILATERAL    Order Specific Question:   Reason for Exam (SYMPTOM  OR DIAGNOSIS REQUIRED)    Answer:   screening    Order Specific Question:   Is the patient pregnant?    Answer:   No    Order Specific Question:   Preferred imaging location?    Answer:   [facility]  . Flu Vaccine QUAD 36+ mos PF IM (Fluarix & Fluzone Quad PF)  . Urinalysis w microscopic + reflex cultur  . Lipid panel  . CBC with Differential/Platelet  . Hemoglobin A1c  . Microalbumin / creatinine urine ratio  . TSH  . Magnesium  . COMPLETE METABOLIC PANEL WITH GFR  . Ambulatory referral to Dermatology    Referral Priority:  Routine    Referral Type:   Consultation    Referral Reason:   Specialty Services Required    Requested Specialty:   Dermatology    Number of Visits Requested:   1    Follow up plan: Return in about 1 year (around 09/28/2017) for complete physical.  An After Visit Summary was printed and given to the patient.

## 2016-09-28 NOTE — Assessment & Plan Note (Signed)
Start ropinirole

## 2016-09-28 NOTE — Assessment & Plan Note (Signed)
USPSTF grade A and B recommendations reviewed with patient; age-appropriate recommendations, preventive care, screening tests, etc discussed and encouraged; healthy living encouraged; see AVS for patient education given to patient  

## 2016-09-29 ENCOUNTER — Other Ambulatory Visit: Payer: Self-pay | Admitting: Family Medicine

## 2016-09-29 ENCOUNTER — Telehealth: Payer: Self-pay

## 2016-09-29 LAB — COMPLETE METABOLIC PANEL WITH GFR
ALT: 27 U/L (ref 6–29)
AST: 19 U/L (ref 10–35)
Albumin: 4.1 g/dL (ref 3.6–5.1)
Alkaline Phosphatase: 121 U/L (ref 33–130)
BUN: 15 mg/dL (ref 7–25)
CHLORIDE: 95 mmol/L — AB (ref 98–110)
CO2: 22 mmol/L (ref 20–31)
CREATININE: 0.98 mg/dL (ref 0.50–1.05)
Calcium: 9.6 mg/dL (ref 8.6–10.4)
GFR, EST AFRICAN AMERICAN: 77 mL/min (ref >=60)
GFR, Est Non African American: 67 mL/min (ref >=60)
Glucose, Bld: 455 mg/dL — ABNORMAL HIGH (ref 65–99)
Potassium: 4.2 mmol/L (ref 3.5–5.3)
Sodium: 134 mmol/L — ABNORMAL LOW (ref 135–146)
Total Bilirubin: 0.5 mg/dL (ref 0.2–1.2)
Total Protein: 6.7 g/dL (ref 6.1–8.1)

## 2016-09-29 LAB — LIPID PANEL
Cholesterol: 196 mg/dL (ref <200)
HDL: 33 mg/dL — AB (ref >50)
TRIGLYCERIDES: 856 mg/dL — AB (ref <150)
Total CHOL/HDL Ratio: 5.9 Ratio — ABNORMAL HIGH (ref <5.0)

## 2016-09-29 LAB — URINALYSIS W MICROSCOPIC + REFLEX CULTURE
BACTERIA UA: NONE SEEN [HPF]
BILIRUBIN URINE: NEGATIVE
CASTS: NONE SEEN [LPF]
CRYSTALS: NONE SEEN [HPF]
HGB URINE DIPSTICK: NEGATIVE
KETONES UR: NEGATIVE
Leukocytes, UA: NEGATIVE
Nitrite: NEGATIVE
PROTEIN: NEGATIVE
RBC / HPF: NONE SEEN RBC/HPF (ref <=2)
SQUAMOUS EPITHELIAL / LPF: NONE SEEN [HPF] (ref <=5)
Specific Gravity, Urine: 1.031 (ref 1.001–1.035)
WBC, UA: NONE SEEN WBC/HPF (ref <=5)
Yeast: NONE SEEN [HPF]
pH: 5 (ref 5.0–8.0)

## 2016-09-29 LAB — PAP IG AND HPV HIGH-RISK: HPV DNA High Risk: NOT DETECTED

## 2016-09-29 LAB — MAGNESIUM: MAGNESIUM: 1.5 mg/dL (ref 1.5–2.5)

## 2016-09-29 LAB — MICROALBUMIN / CREATININE URINE RATIO
Creatinine, Urine: 32 mg/dL (ref 20–320)
Microalb Creat Ratio: 41 mcg/mg creat — ABNORMAL HIGH (ref <30)
Microalb, Ur: 1.3 mg/dL

## 2016-09-29 LAB — HEMOGLOBIN A1C
Hgb A1c MFr Bld: 13.3 % — ABNORMAL HIGH (ref <5.7)
Mean Plasma Glucose: 335 mg/dL

## 2016-09-29 MED ORDER — INSULIN ASPART 100 UNIT/ML ~~LOC~~ SOLN
SUBCUTANEOUS | 1 refills | Status: DC
Start: 2016-09-29 — End: 2017-09-11

## 2016-09-29 MED ORDER — ICOSAPENT ETHYL 1 G PO CAPS: ORAL_CAPSULE | ORAL | 11 refills | Status: DC

## 2016-09-29 NOTE — Telephone Encounter (Signed)
[facility] called with critical lab of Glucose 455

## 2016-09-29 NOTE — Telephone Encounter (Signed)
Thank you, unfortunately expected with her not being on insulin; she is back on now; thank you

## 2016-09-29 NOTE — Progress Notes (Signed)
Humalog not covered; switch short-acting insulin

## 2016-09-29 NOTE — Progress Notes (Signed)
Switch from lovaza to [employer]

## 2016-10-03 IMAGING — CR DG LUMBAR SPINE 2-3V
1 series · 3 of 3 positions shown · non-contrast
Comparison: Abdominal and pelvic CT scan November 19, 2014.

CLINICAL DATA: Two month history of left-sided back pain radiating
into the left leg with leg weakness tingling and burning

EXAM:
LUMBAR SPINE - 2-3 VIEW

[Series 1: ap · 0.17mm/px · 3 of 3 slices shown]
[im 1/3]
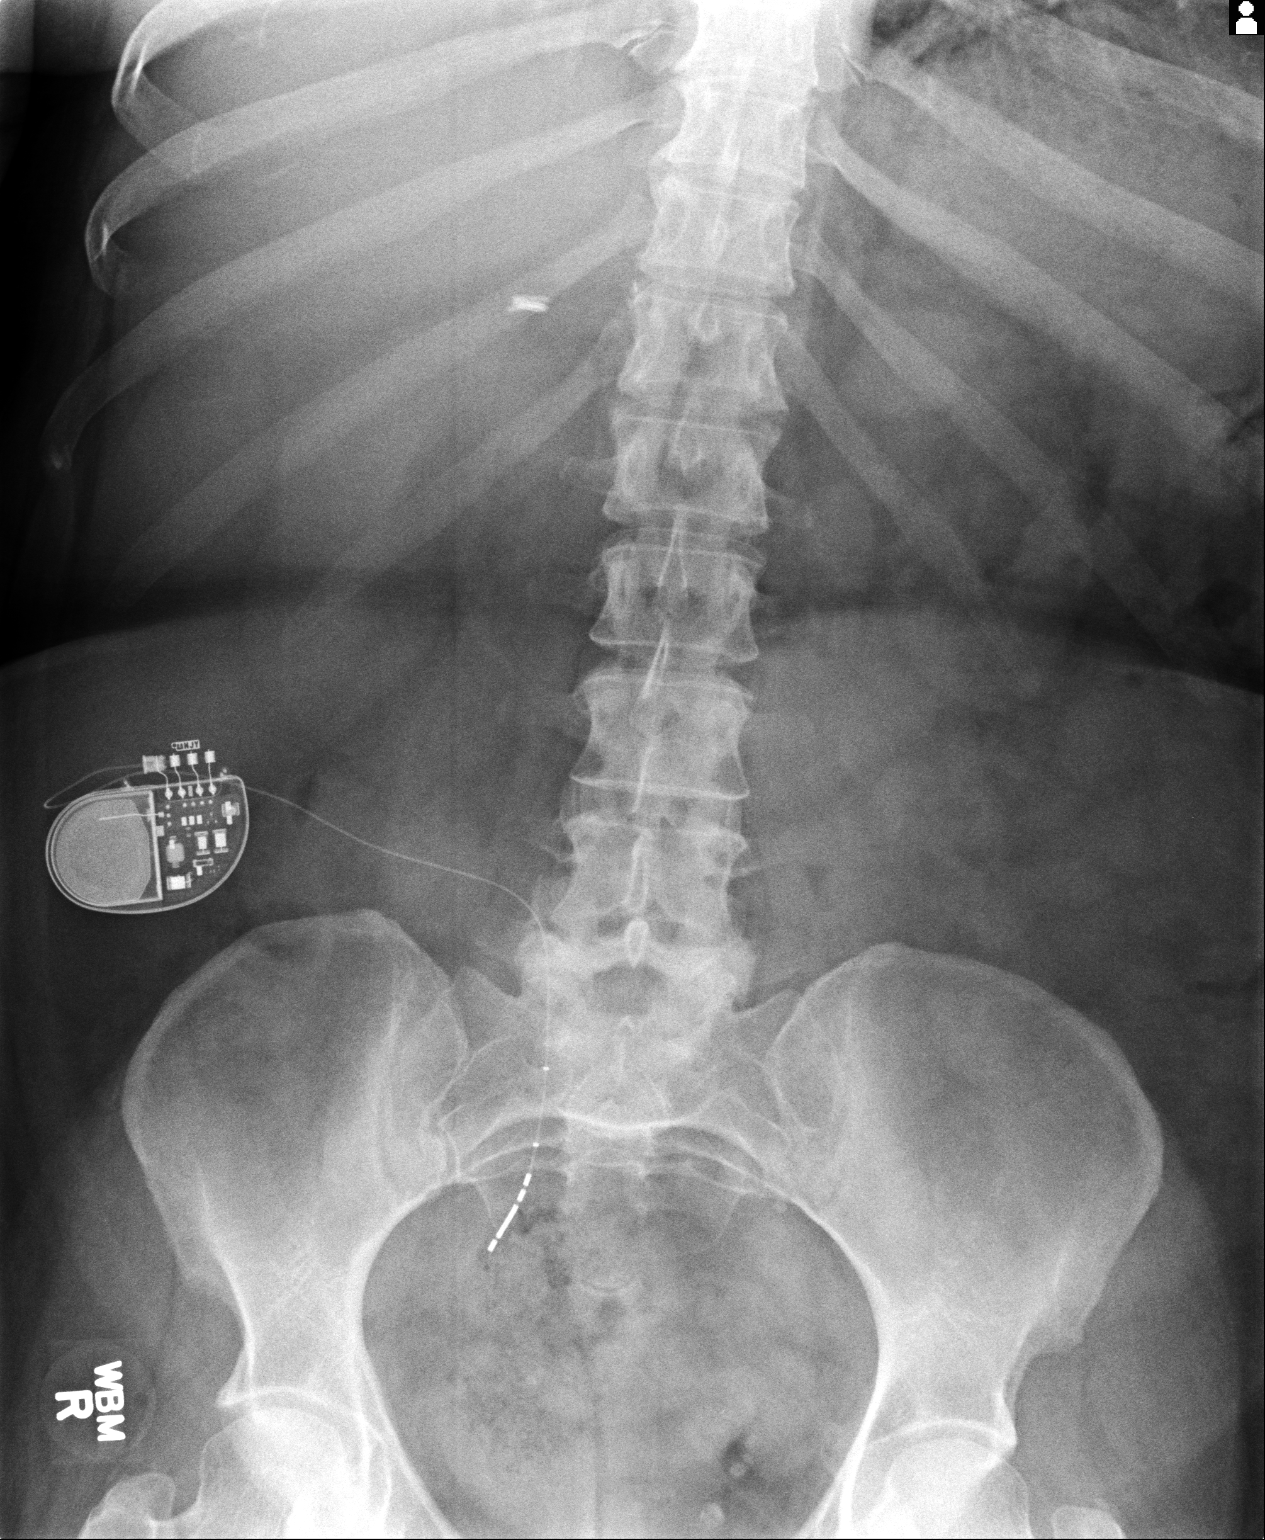
[im 2/3]
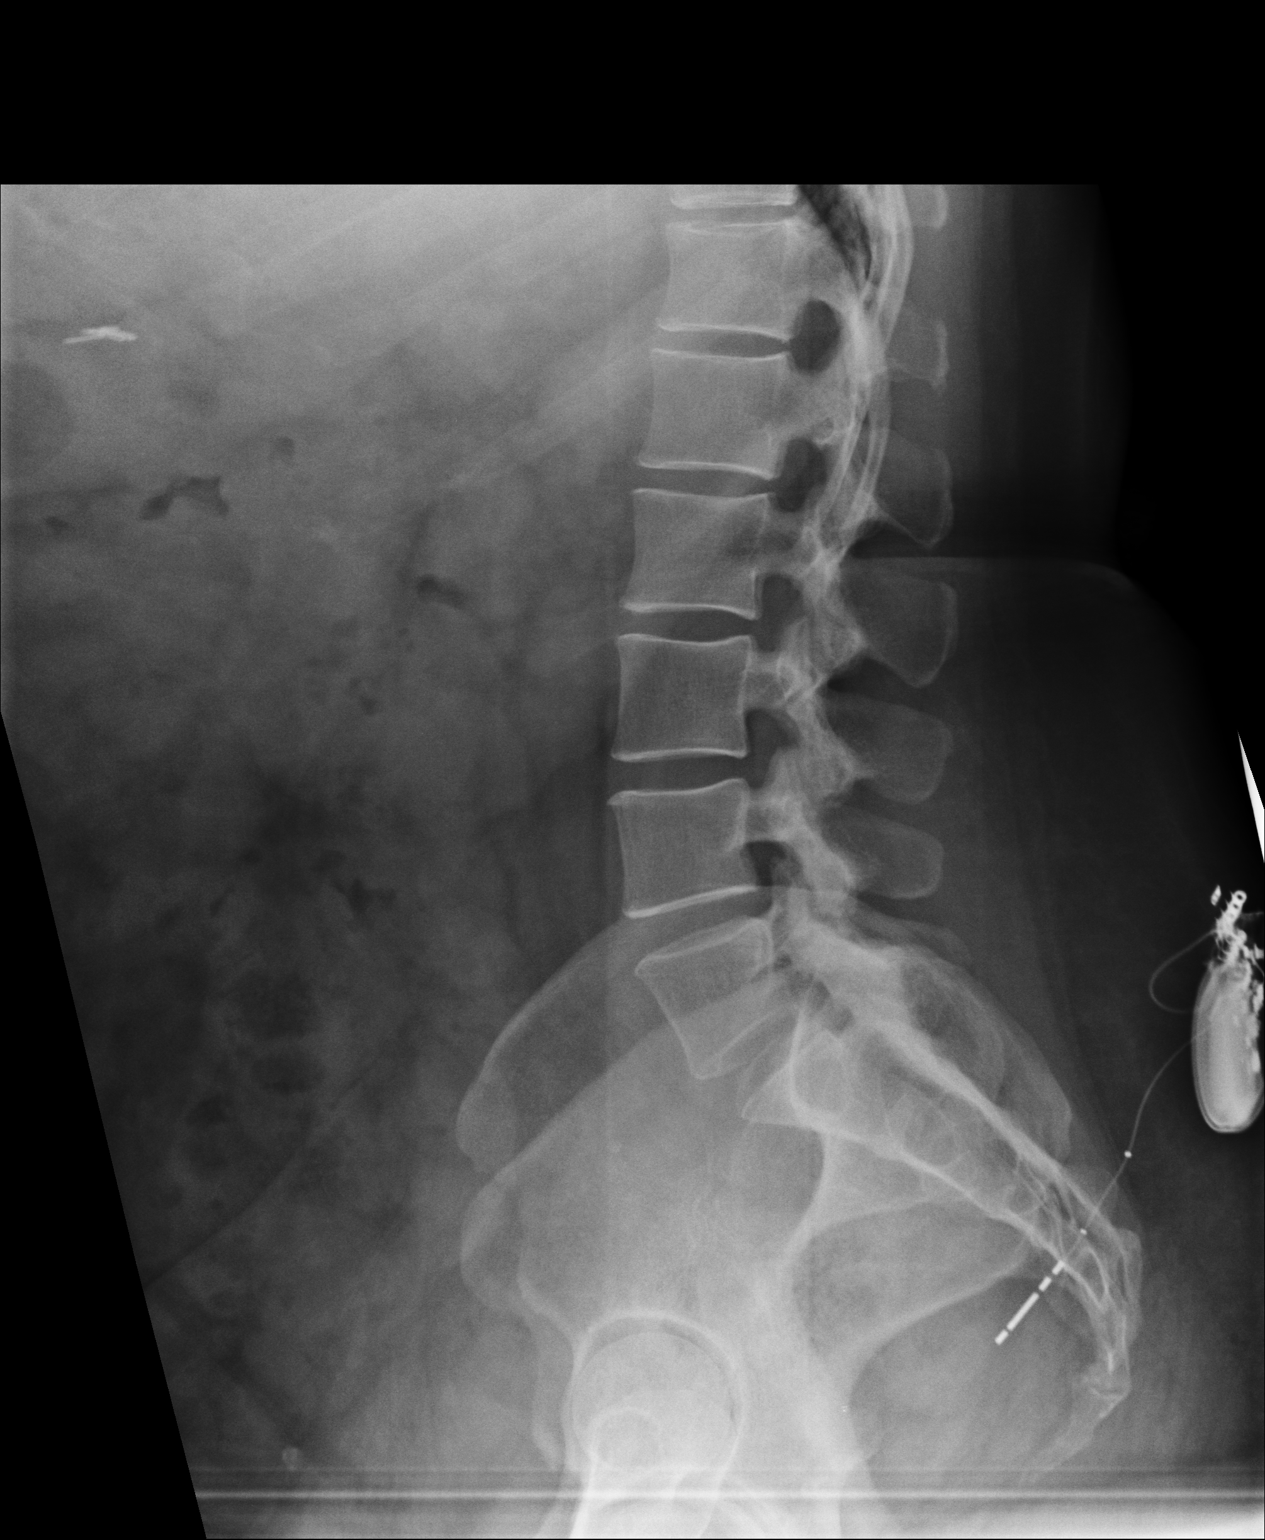
[im 3/3]
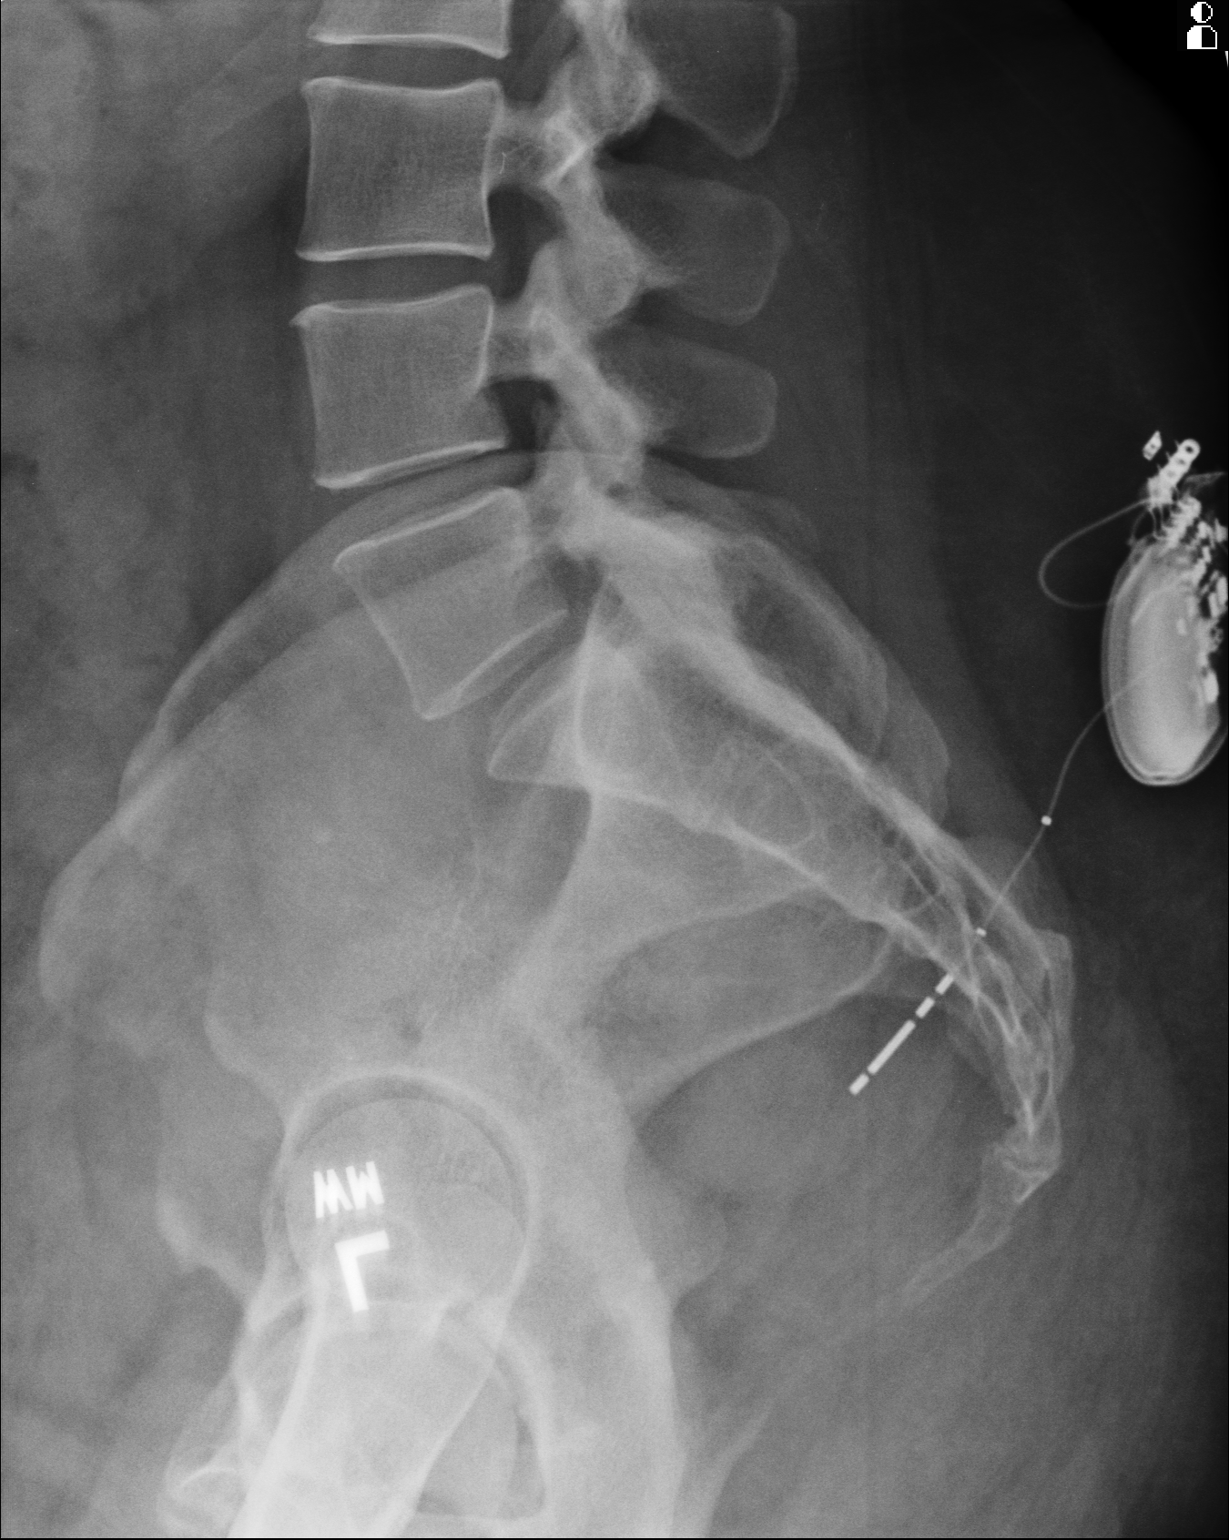

[3 of 3 positions shown; findings below may reference images not displayed]

FINDINGS: The lumbar vertebral bodies are preserved in height. The disc space
heights are well maintained. There is no spondylolisthesis. There is
moderate facet joint hypertrophy at L5-S1. The pedicles and
transverse processes are intact. The observed portions of the sacrum
are normal. The bladder stimulator electrode appears unchanged in
position.
IMPRESSION: There is moderate facet joint hypertrophy at L5-S1. Otherwise the
lumbar spine is unremarkable.

## 2016-10-04 ENCOUNTER — Telehealth: Payer: Self-pay

## 2016-10-04 NOTE — Telephone Encounter (Signed)
You change the pt insulin medication to novolog but the pt picked up her old medication before the pharmacy received the RX for her new insulin medication. The insurance will not pay for the novolog since she already picked up the old Rx. All this happen on the 09/30/2015. The [facility] pharm tech call to informed us that she has the old insulin and can not pick up the new until her next refill.

## 2016-10-08 ENCOUNTER — Encounter: Payer: Self-pay | Admitting: Family Medicine

## 2016-10-10 ENCOUNTER — Telehealth: Payer: Self-pay

## 2016-10-10 ENCOUNTER — Other Ambulatory Visit: Payer: Self-pay | Admitting: Family Medicine

## 2016-10-10 DIAGNOSIS — E1140 Type 2 diabetes mellitus with diabetic neuropathy, unspecified: Secondary | ICD-10-CM

## 2016-10-10 DIAGNOSIS — IMO0002 Reserved for concepts with insufficient information to code with codable children: Secondary | ICD-10-CM

## 2016-10-10 DIAGNOSIS — E1165 Type 2 diabetes mellitus with hyperglycemia: Principal | ICD-10-CM

## 2016-10-10 NOTE — Progress Notes (Signed)
Great, thank you!

## 2016-10-10 NOTE — Telephone Encounter (Signed)
We have coupons

## 2016-10-10 NOTE — Telephone Encounter (Signed)
Patient returned my call and stated that she and [name] had discussed during her last visit of seeing Dr. Carmela Rima for diabetes.   New referral was placed and sent to his office.

## 2016-10-10 NOTE — Telephone Encounter (Signed)
JB from [facility] called and stated that the pt is not able to afford 200 a month for Vascepa. JB is wondering can she just be on fish oil.

## 2016-10-10 NOTE — Telephone Encounter (Signed)
Got a call from [place] with Dr. Barkley Boards office stating that they received this patient's entire medical record and wanted to know what to do with it. After searching and not finding a referral, we assumed this patient is switching PCP. I called this patient to confirm but there was no answer. A message was left for her to give [place] a call when she got the chance so that we can have clarity.

## 2016-10-17 IMAGING — CT CT HEAD WO/W CM
1 of 2 series · 13 of 30 positions shown, 17 images · IV contrast (omnipaque)
Comparison: Noncontrast Head CT 07/15/2015 and earlier.

CLINICAL DATA: 49-year-old female with intermittent headaches.
Vascular headache. Bladder stimulator precludes MRI. Initial
encounter.

EXAM:
CT HEAD WITHOUT AND WITH CONTRAST
TECHNIQUE: Contiguous axial images were obtained from the base of the skull
through the vertex without and with intravenous contrast
CONTRAST:  100mL OMNIPAQUE IOHEXOL 300 MG/ML  SOLN

[Series 2: head wo · axial · 0.38mm/px · z∈[-74,+41]mm · 13 of 27 slices shown, 17 images]
[im 2/27  brain]
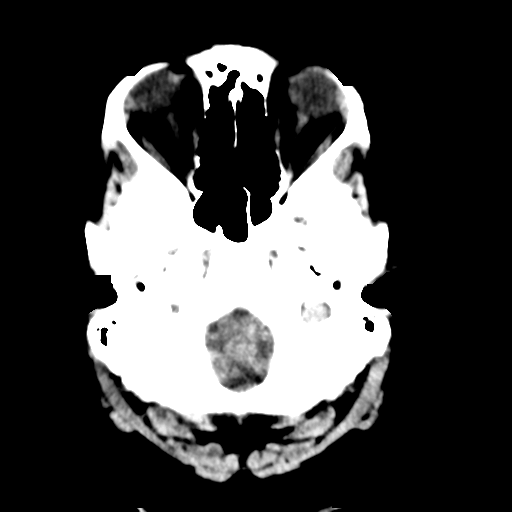
[im 2/27  bone]
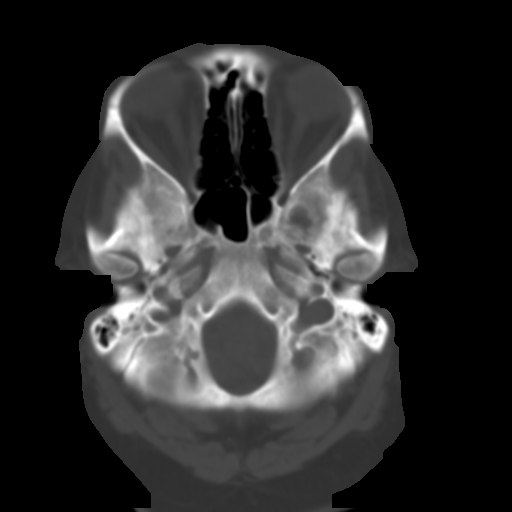
[im 4/27  brain]
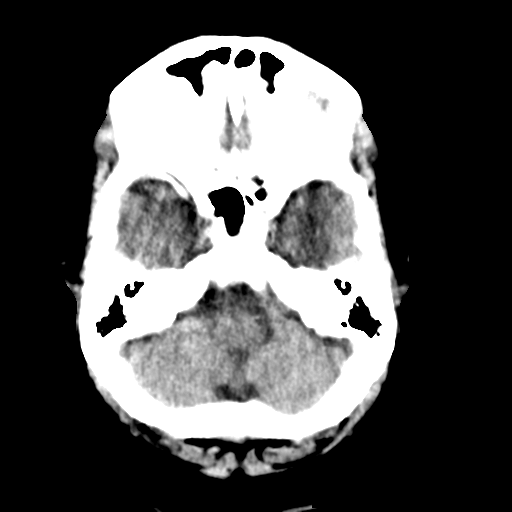
[im 6/27  brain]
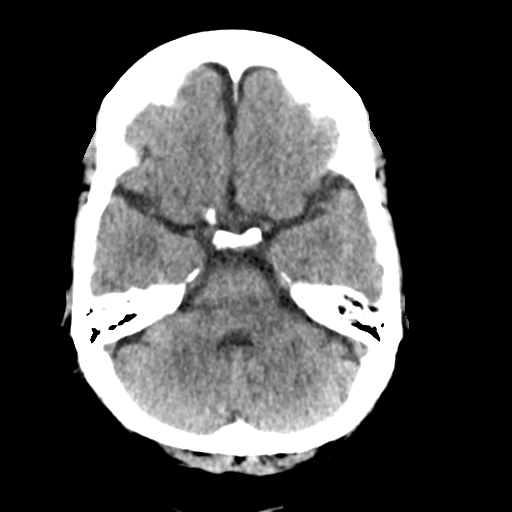
[im 8/27  brain]
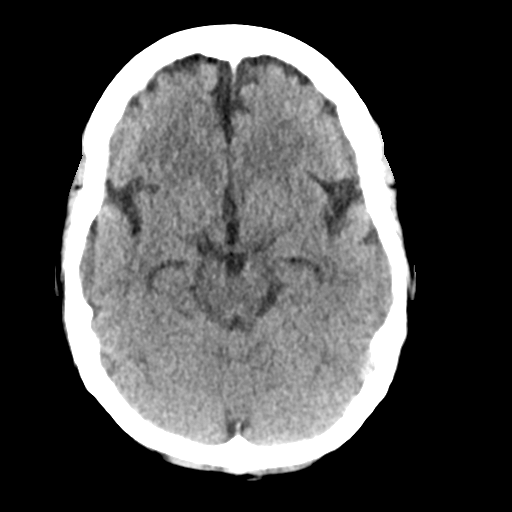
[im 10/27  brain]
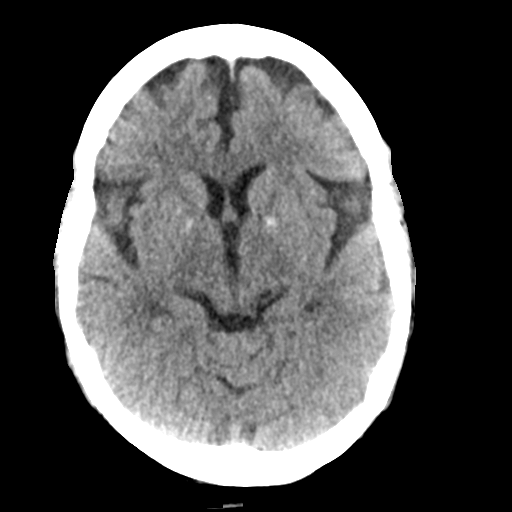
[im 10/27  bone]
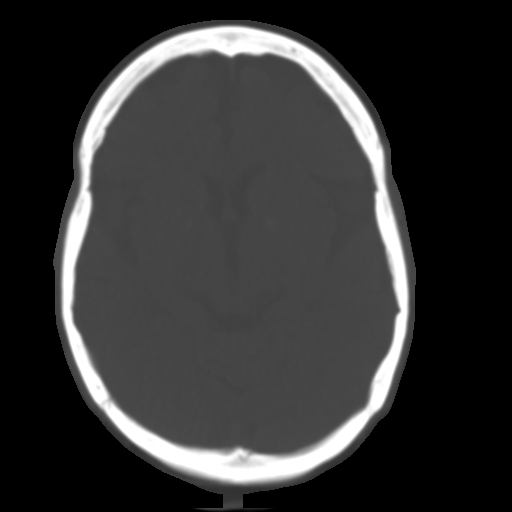
[im 12/27  brain]
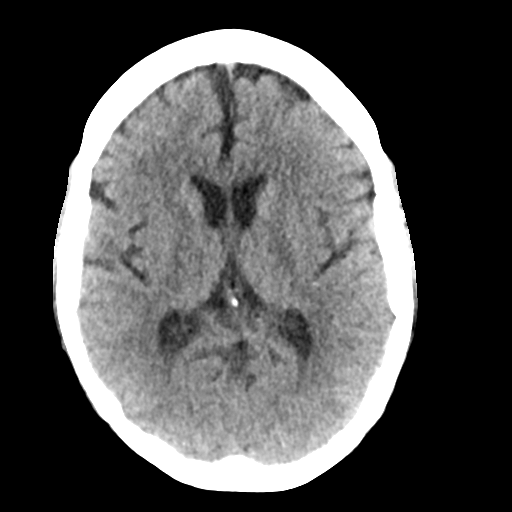
[im 14/27  brain]
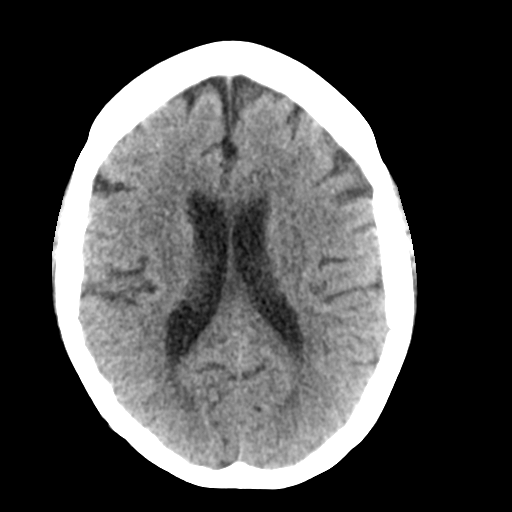
[im 15/27  brain]
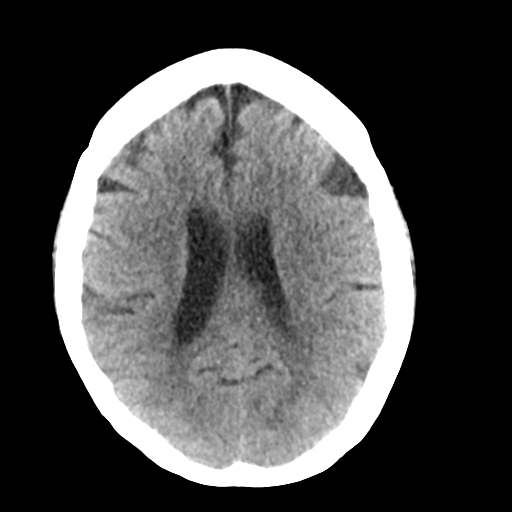
[im 17/27  brain]
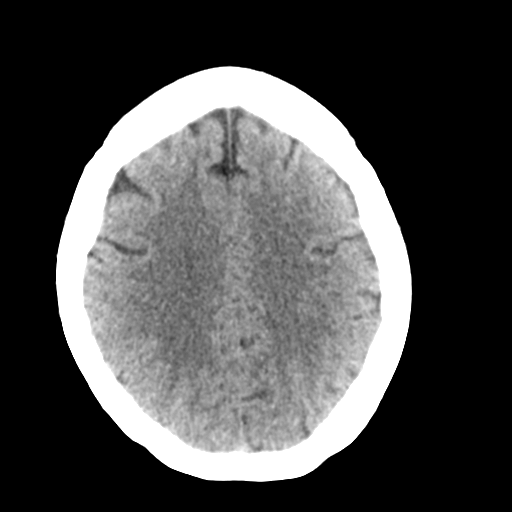
[im 17/27  bone]
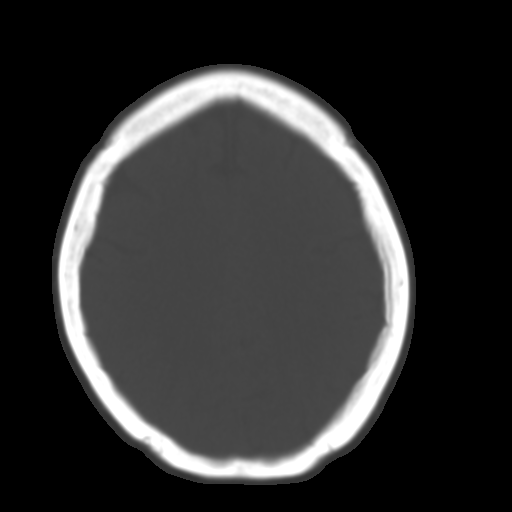
[im 19/27  brain]
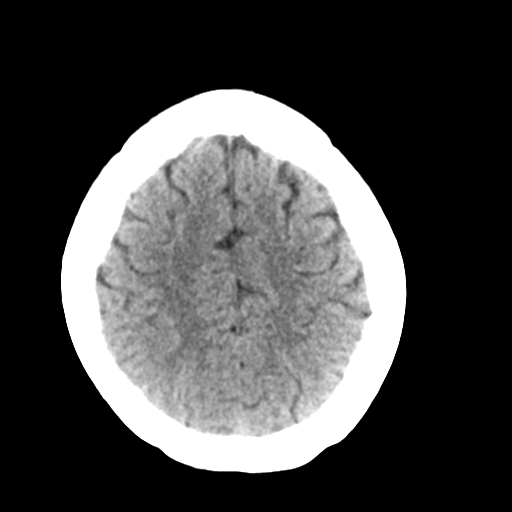
[im 21/27  brain]
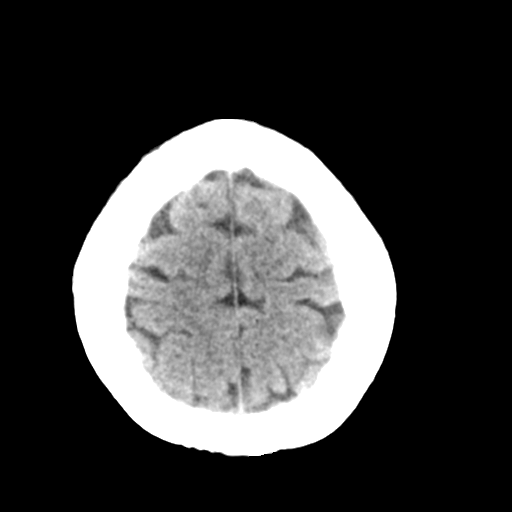
[im 23/27  brain]
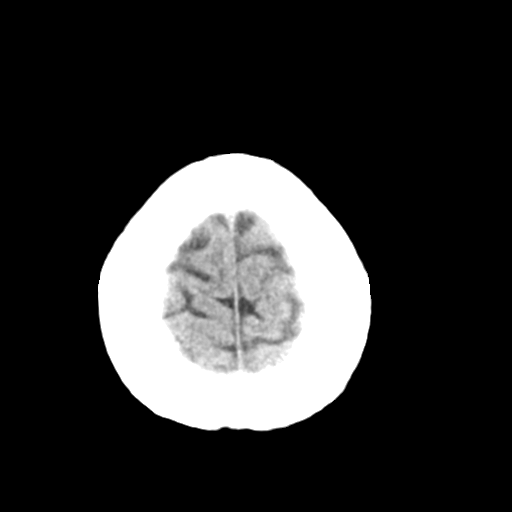
[im 25/27  brain]
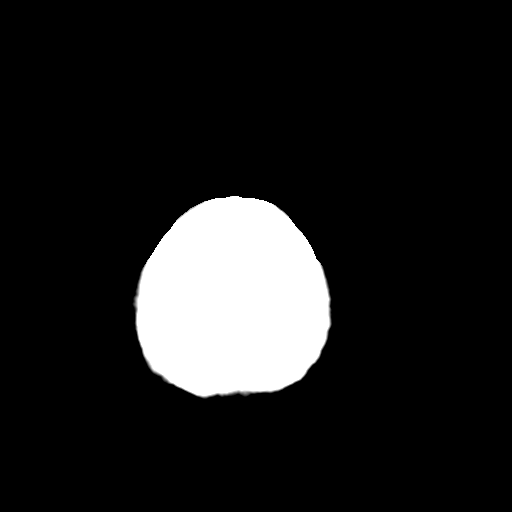
[im 25/27  bone]
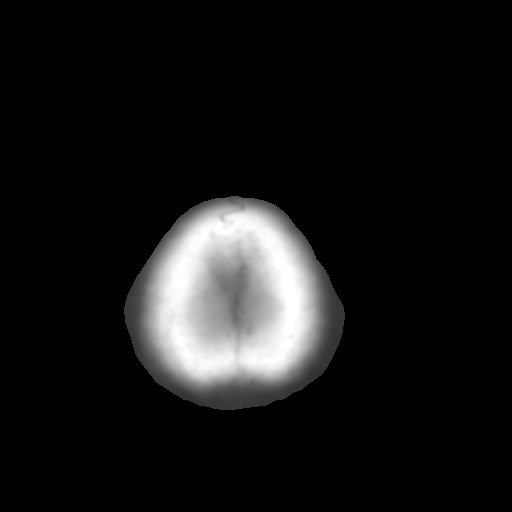

[13 of 30 positions shown; findings below may reference images not displayed]

FINDINGS: Improved paranasal sinus aeration since [REDACTED], all visualized
paranasal sinuses are clear aside from mild residual posterior
ethmoid mucosal thickening on the right.

No acute osseous abnormality identified. Negative orbit and scalp
soft tissues.

Cerebral volume is stable and within normal limits for age. No
ventriculomegaly. No midline shift, mass effect, or evidence of
intracranial mass lesion. Mild chronic dystrophic lentiform nuclei
calcifications again noted. No evidence of cortically based acute
infarction identified. No acute intracranial hemorrhage identified.
Major intracranial vascular structures are enhancing. No abnormal
enhancement identified.
IMPRESSION: 1. Stable and negative CT appearance of the brain.
2. Largely resolved paranasal sinus inflammation since [DATE].

## 2016-10-19 ENCOUNTER — Other Ambulatory Visit: Payer: Self-pay

## 2016-10-19 DIAGNOSIS — E781 Pure hyperglyceridemia: Secondary | ICD-10-CM

## 2016-10-19 MED ORDER — ATORVASTATIN CALCIUM 40 MG PO TABS: 40.0000 mg | ORAL_TABLET | Freq: Every day | ORAL | 2 refills | Status: DC

## 2016-10-19 MED ORDER — LEVOTHYROXINE SODIUM 75 MCG PO TABS: 75.0000 ug | ORAL_TABLET | Freq: Every day | ORAL | 11 refills | Status: DC

## 2016-10-19 NOTE — Telephone Encounter (Signed)
Jan 2018 TSH reviewed; Rx approved

## 2016-10-19 NOTE — Telephone Encounter (Signed)
Last alt and lipids reviewed; consult pending for lipid management; refills sent

## 2016-10-24 ENCOUNTER — Emergency Department
Admission: EM | Admit: 2016-10-24 | Discharge: 2016-10-24 | Disposition: A | Discharge status: Home / Self Care | Payer: Medicare HMO | Attending: Emergency Medicine | Admitting: Emergency Medicine

## 2016-10-24 DIAGNOSIS — Z794 Long term (current) use of insulin: Secondary | ICD-10-CM | POA: Insufficient documentation

## 2016-10-24 DIAGNOSIS — J069 Acute upper respiratory infection, unspecified: Secondary | ICD-10-CM | POA: Diagnosis not present

## 2016-10-24 DIAGNOSIS — R0981 Nasal congestion: Secondary | ICD-10-CM | POA: Diagnosis present

## 2016-10-24 DIAGNOSIS — E1165 Type 2 diabetes mellitus with hyperglycemia: Secondary | ICD-10-CM | POA: Insufficient documentation

## 2016-10-24 DIAGNOSIS — Z79899 Other long term (current) drug therapy: Secondary | ICD-10-CM | POA: Diagnosis not present

## 2016-10-24 DIAGNOSIS — J449 Chronic obstructive pulmonary disease, unspecified: Secondary | ICD-10-CM | POA: Insufficient documentation

## 2016-10-24 DIAGNOSIS — H6093 Unspecified otitis externa, bilateral: Secondary | ICD-10-CM | POA: Insufficient documentation

## 2016-10-24 DIAGNOSIS — I110 Hypertensive heart disease with heart failure: Secondary | ICD-10-CM | POA: Diagnosis not present

## 2016-10-24 DIAGNOSIS — J45909 Unspecified asthma, uncomplicated: Secondary | ICD-10-CM | POA: Diagnosis not present

## 2016-10-24 DIAGNOSIS — H60503 Unspecified acute noninfective otitis externa, bilateral: Secondary | ICD-10-CM

## 2016-10-24 DIAGNOSIS — E039 Hypothyroidism, unspecified: Secondary | ICD-10-CM | POA: Insufficient documentation

## 2016-10-24 DIAGNOSIS — I509 Heart failure, unspecified: Secondary | ICD-10-CM | POA: Diagnosis not present

## 2016-10-24 LAB — CBC WITH DIFFERENTIAL/PLATELET
BASOS PCT: 0 %
Basophils Absolute: 0 10*3/uL (ref 0–0.1)
EOS ABS: 0.1 10*3/uL (ref 0–0.7)
Eosinophils Relative: 1 %
HCT: 35.6 % (ref 35.0–47.0)
Hemoglobin: 12.4 g/dL (ref 12.0–16.0)
Lymphocytes Relative: 13 %
Lymphs Abs: 1.1 10*3/uL (ref 1.0–3.6)
MCH: 29.5 pg (ref 26.0–34.0)
MCHC: 34.8 g/dL (ref 32.0–36.0)
MCV: 84.7 fL (ref 80.0–100.0)
MONO ABS: 0.9 10*3/uL (ref 0.2–0.9)
MONOS PCT: 11 %
NEUTROS ABS: 6.5 10*3/uL (ref 1.4–6.5)
NEUTROS PCT: 75 %
Platelets: 218 10*3/uL (ref 150–440)
RBC: 4.2 MIL/uL (ref 3.80–5.20)
RDW: 13.9 % (ref 11.5–14.5)
WBC: 8.6 10*3/uL (ref 3.6–11.0)

## 2016-10-24 LAB — INFLUENZA PANEL BY PCR (TYPE A & B)
Influenza A By PCR: NEGATIVE
Influenza B By PCR: NEGATIVE

## 2016-10-24 LAB — BASIC METABOLIC PANEL
ANION GAP: 13 (ref 5–15)
BUN: 13 mg/dL (ref 6–20)
CALCIUM: 9 mg/dL (ref 8.9–10.3)
CO2: 22 mmol/L (ref 22–32)
CREATININE: 1.02 mg/dL — AB (ref 0.44–1.00)
Chloride: 100 mmol/L — ABNORMAL LOW (ref 101–111)
Glucose, Bld: 342 mg/dL — ABNORMAL HIGH (ref 65–99)
Potassium: 4.4 mmol/L (ref 3.5–5.1)
SODIUM: 135 mmol/L (ref 135–145)

## 2016-10-24 LAB — SEDIMENTATION RATE: Sed Rate: 59 mm/hr — ABNORMAL HIGH (ref 0–30)

## 2016-10-24 LAB — POCT RAPID STREP A: Streptococcus, Group A Screen (Direct): NEGATIVE

## 2016-10-24 MED ORDER — KETOROLAC TROMETHAMINE 30 MG/ML IJ SOLN
10.0000 mg | Freq: Once | INTRAMUSCULAR | Status: AC
Start: 2016-10-24 — End: 2016-10-24
  Administered 2016-10-24: 9.9 mg via INTRAVENOUS
  Filled 2016-10-24: qty 1

## 2016-10-24 MED ORDER — SODIUM CHLORIDE 0.9 % IV BOLUS (SEPSIS)
1000.0000 mL | Freq: Once | INTRAVENOUS | Status: AC
  Administered 2016-10-24: 1000 mL via INTRAVENOUS

## 2016-10-24 MED ORDER — CIPROFLOXACIN-DEXAMETHASONE 0.3-0.1 % OT SUSP: 4.0000 [drp] | Freq: Two times a day (BID) | OTIC | 0 refills | Status: AC

## 2016-10-24 MED ORDER — ACETAMINOPHEN 500 MG PO TABS
1000.0000 mg | ORAL_TABLET | Freq: Once | ORAL | Status: DC
Start: 2016-10-24 — End: 2016-10-24
  Filled 2016-10-24: qty 2

## 2016-10-24 MED ORDER — CIPROFLOXACIN HCL 500 MG PO TABS: 500.0000 mg | ORAL_TABLET | Freq: Two times a day (BID) | ORAL | 0 refills | Status: DC

## 2016-10-24 MED ORDER — METOCLOPRAMIDE HCL 5 MG/ML IJ SOLN
10.0000 mg | Freq: Once | INTRAMUSCULAR | Status: AC
  Administered 2016-10-24: 10 mg via INTRAVENOUS
  Filled 2016-10-24: qty 2

## 2016-10-24 MED ORDER — CIPROFLOXACIN-DEXAMETHASONE 0.3-0.1 % OT SUSP
4.0000 [drp] | Freq: Once | OTIC | Status: AC
  Administered 2016-10-24: 4 [drp] via OTIC
  Filled 2016-10-24: qty 7.5

## 2016-10-24 NOTE — ED Provider Notes (Signed)
[facility] Emergency Department Provider Note  ____________________________________________  Time seen: Approximately 7:31 AM  I have reviewed the triage vital signs and the nursing notes.   HISTORY  Chief Complaint Sore Throat and Nasal Congestion   HPI [name] is a 51 y.o. female who presents for evaluation of sore throat, congestion, and bilateral ear ache since yesterday. Patient reports that she has had congestion and pressure on her face and moderate, constant, dull pain in her bilateral ears. She reports that the pain is worse when she lays on her side and puts pressure on the ears. She denies cough, shortness of breath, chest pain, or fever. She does report vomiting and diarrheasince yesterday as well. Patient reports that her sugars have been high for the last few months in the 400s. She has an appointment with her PCP coming up this week. She has recently been exposed to someone with the flu. She has no abdominal pain, no dysuria.   Past Medical History:  Diagnosis Date  . Asthma   . Bipolar depression ([place])   . CHF (congestive heart failure) ([place])   . Chronic headaches   . COPD (chronic obstructive pulmonary disease) ([place])   . Diabetic neuropathy ([place])   . GERD (gastroesophageal reflux disease)   . Headache   . Heart attack   . Hyperlipidemia   . Hypertension   . Hypomagnesemia   . Hypothyroid   . Irregular heart rhythm   . Liver lesion    Favored to be benign on CT; MRI of abd w/contract in Aug 2016. Liver Biopsy-Negative, March 2016  . Morbid obesity ([place])   . OSA (obstructive sleep apnea)   . Pinched nerve    Back  . RLS (restless legs syndrome)   . Seasonal allergies   . Sleep apnea   . Type 2 diabetes, uncontrolled, with neuropathy ([place]) 10/15/2015   Overview:  Microalbumin 7.7 12/2010: Hgb A1c 10.2% 12/2010 and 9.7% 03/2011, eye exam 10/2009 [facility]   . Urinary incontinence   . Vitamin D deficiency     Patient  Active Problem List   Diagnosis Date Noted  . 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 Breast cancer screening 09/28/2016  . Encounter for screening for cervical cancer  09/28/2016  . Urine frequency 09/28/2016  . Preventative health care 09/28/2016  . Fatigue 05/01/2016  . Urinary tract infection 03/14/2016  . Adenomatous colon polyp 10/15/2015  . Airway hyperreactivity 10/15/2015  . Bipolar affective disorder ([place]) 10/15/2015  . CN (constipation) 10/15/2015  . CAFL (chronic airflow limitation) ([place]) 10/15/2015  . Drug-induced hepatic toxicity 10/15/2015  . Blood in feces 10/15/2015  . HLD (hyperlipidemia) 10/15/2015  . Hypoxemia 10/15/2015  . NASH (nonalcoholic steatohepatitis) 10/15/2015  . Adiposity 10/15/2015  . Restless leg syndrome 10/15/2015  . Type 2 diabetes, uncontrolled, with neuropathy ([name]) 10/15/2015  . Elevated alkaline phosphatase level 09/22/2015  . Urinary bladder incontinence 09/22/2015  . Diarrhea 08/18/2015  . Low back pain with left-sided sciatica 08/18/2015  . Encounter for monitoring tricyclic antidepressant therapy 05/13/2015  . Bipolar disorder ([place]) 05/11/2015  . Liver disease 05/11/2015  . Diabetes ([place]) 05/11/2015  . Apnea, sleep 05/05/2015  . Heart valve disease 05/05/2015  . Chronic kidney disease, stage III (moderate) 03/15/2015  . Migraine 03/12/2015  . COPD (chronic obstructive pulmonary disease) ([place]) 01/20/2015  . 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 GERD (gastroesophageal reflux disease) 01/20/2015  . Disorder of smooth muscle  01/20/2015  . Hypothyroid   . Vitamin D deficiency   . Urinary incontinence   . Liver lesion   . Hypomagnesemia   . Obstructive sleep apnea 10/20/2013  . PERIPHERAL NEUROPATHY 03/04/2009  . HEMORRHOIDS, EXTERNAL 09/03/2008  . ALLERGIC RHINITIS 07/04/2007  . ASTHMA, PERSISTENT, MODERATE 07/04/2007  . INTERSTITIAL CYSTITIS 07/04/2007  . HYPERTRIGLYCERIDEMIA 07/03/2007  . FATIGUE,  CHRONIC 07/03/2007  . HYPERTENSION, BENIGN ESSENTIAL 06/19/2007    Past Surgical History:  Procedure Laterality Date  . ABDOMINAL HYSTERECTOMY    . ABDOMINAL HYSTERECTOMY     Partial  . BLADDER SURGERY     x 2  . CHOLECYSTECTOMY    . LIVER BIOPSY  March 2016   Negative  . LUNG BIOPSY    . TUBAL LIGATION      Prior to Admission medications   Medication Sig Start Date End Date Taking? Authorizing Provider  ACCU-CHEK AVIVA PLUS test strip Check FSBS three times a day; LON 99 months, DX E11.40 05/10/16   Arnetha Courser, MD  ACCU-CHEK SOFTCLIX LANCETS lancets Check FSBS three times a day; LON 99 months, DX E11.40 05/10/16   Arnetha Courser, MD  albuterol (PROVENTIL HFA;VENTOLIN HFA) 108 (90 Base) MCG/ACT inhaler Inhale 2 puffs into the lungs every 4 (four) hours as needed for wheezing or shortness of breath. 11/19/15   Arnetha Courser, MD  amLODipine (NORVASC) 2.5 MG tablet Take 1 tablet (2.5 mg total) by mouth daily. 07/18/16   Arnetha Courser, MD  atorvastatin (LIPITOR) 40 MG tablet Take 1 tablet (40 mg total) by mouth at bedtime. 10/19/16   Arnetha Courser, MD  ciprofloxacin (CIPRO) 500 MG tablet Take 1 tablet (500 mg total) by mouth 2 (two) times daily. 10/24/16 11/03/16  Rudene Re, MD  ciprofloxacin-dexamethasone (CIPRODEX) otic suspension Place 4 drops into both ears 2 (two) times daily. 10/24/16 10/31/16  Rudene Re, MD  DULoxetine (CYMBALTA) 60 MG capsule Take 120 mg by mouth daily.    Historical Provider, MD  fluticasone (FLONASE) 50 MCG/ACT nasal spray Place 2 sprays into both nostrils daily. 08/11/16   Arnetha Courser, MD  Icosapent Ethyl (VASCEPA) 1 g CAPS Two pills by mouth twice a day for cholesterol (this replaces Lovaza) 09/29/16   Arnetha Courser, MD  insulin aspart (NOVOLOG) 100 UNIT/ML injection Short-acting insulin TID ac as follows: 70-150=30units 151-200=35units 201-250=40units 251-300=45units 301-350=50units 351-400=55units 401-450=60units 09/29/16   Arnetha Courser,  MD  insulin NPH Human (HUMULIN N,NOVOLIN N) 100 UNIT/ML injection Per endo: 75 units in am, 50 units at lunch and 75 with supper; subscutaneous 09/22/16   Arnetha Courser, MD  Insulin Syringe-Needle U-100 (ULTICARE INSULIN SYRINGE) 31G X 1/4" 0.3 ML MISC Inject 1 each into the skin 5 (five) times daily as needed. 09/22/16   Arnetha Courser, MD  lamoTRIgine (LAMICTAL) 150 MG tablet Take 150 mg by mouth daily.  03/22/15   Historical Provider, MD  levothyroxine (SYNTHROID, LEVOTHROID) 75 MCG tablet Take 1 tablet (75 mcg total) by mouth daily. 10/19/16   Arnetha Courser, MD  linaclotide (LINZESS) 72 MCG capsule Take 1 capsule (72 mcg total) by mouth daily before breakfast. 08/21/16   [name], MD  magnesium oxide (MAG-OX) 400 MG tablet Take 1 tablet (400 mg total) by mouth 3 (three) times daily. 05/04/16   Arnetha Courser, MD  metoprolol tartrate (LOPRESSOR) 25 MG tablet Take 0.5 tablets (12.5 mg total) by mouth 2 (two) times daily. 05/17/16   Arnetha Courser, MD  pantoprazole (PROTONIX) 40 MG tablet Take 1 tablet (40 mg total) by mouth daily. 08/21/16   [name], MD  QUEtiapine (SEROQUEL) 25 MG tablet Take 25 mg by mouth every morning. Take one in am, one at lunch    Historical Provider, MD  QUEtiapine (SEROQUEL) 400 MG tablet Take 800 mg by mouth at bedtime. Take 2 tabs at bedtime    Historical Provider, MD  ranitidine (ZANTAC) 300 MG tablet Take 1 tablet (300 mg total) by mouth at bedtime. 05/15/16   Arnetha Courser, MD  REXULTI 1 MG TABS Take 1 mg by mouth daily.  07/26/15   Historical Provider, MD  rOPINIRole (REQUIP) 0.25 MG tablet One by mouth at bedtime x 1 week, then two pills at bedtime 09/28/16   Arnetha Courser, MD  zolpidem (AMBIEN) 10 MG tablet Take 10 mg by mouth at bedtime.  07/26/15   Historical Provider, MD    Allergies Morphine and related; Codeine; Codeine; Hydrocodone-acetaminophen; Mirtazapine; Morphine and related; Sulfamethoxazole-trimethoprim; and Vicodin  [hydrocodone-acetaminophen]  Family History  Problem Relation Age of Onset  . Diabetes Mother   . Heart disease Mother   . Hyperlipidemia Mother   . Hypertension Mother   . Kidney disease Mother   . Mental illness Mother   . Hypothyroidism Mother   . Stroke Mother   . Osteoporosis Mother   . Glaucoma Mother   . Congestive Heart Failure Mother   . Hypertension Father   . Asthma Daughter   . Cancer Daughter     throat  . Heart disease Maternal Grandfather   . Rheum arthritis Maternal Grandfather   . Cancer Maternal Grandfather     liver  . Cancer Paternal Grandmother     lung    Social History Social History  Substance Use Topics  . Smoking status: Never Smoker  . Smokeless tobacco: Never Used  . Alcohol use No    Review of Systems  Constitutional: Negative for fever. Eyes: Negative for visual changes. ENT: + sore throat, bilateral ear ache Neck: No neck pain  Cardiovascular: Negative for chest pain. Respiratory: Negative for shortness of breath. Gastrointestinal: Negative for abdominal pain. + vomiting and diarrhea. Genitourinary: Negative for dysuria. Musculoskeletal: Negative for back pain. Skin: Negative for rash. Neurological: Negative for headaches, weakness or numbness. Psych: No SI or HI  ____________________________________________   PHYSICAL EXAM:  VITAL SIGNS: ED Triage Vitals  Enc Vitals Group     BP 10/24/16 0553 (!) 145/84     Pulse Rate 10/24/16 0553 99     Resp 10/24/16 0553 18     Temp 10/24/16 0553 99 F (37.2 C)     Temp Source 10/24/16 0553 Oral     SpO2 10/24/16 0553 95 %     Weight 10/24/16 0550 186 lb (84.4 kg)     Height 10/24/16 0550 4' 11"  (1.499 m)     Head Circumference --      Peak Flow --      Pain Score 10/24/16 0550 8     Pain Loc --      Pain Edu? --      Excl. in [place]? --     Constitutional: Alert and oriented. Well appearing and in no apparent distress. HEENT:      Head: Normocephalic and atraumatic.          Eyes: Conjunctivae are normal. Sclera is non-icteric. EOMI. PERRL      Ear: Bulla seen on R TM, purulent discharge seen in the  L external ear canal.      Mouth/Throat: Mucous membranes are moist. Oropharynx is clear      Neck: Supple with no signs of meningismus. Cardiovascular: Regular rate and rhythm. No murmurs, gallops, or rubs. 2+ symmetrical distal pulses are present in all extremities. No JVD. Respiratory: Normal respiratory effort. Lungs are clear to auscultation bilaterally. No wheezes, crackles, or rhonchi.  Gastrointestinal: Soft, non tender, and non distended with positive bowel sounds. No rebound or guarding. Genitourinary: No CVA tenderness. Musculoskeletal: Nontender with normal range of motion in all extremities. No edema, cyanosis, or erythema of extremities. Neurologic: Normal speech and language. Face is symmetric. Moving all extremities. No gross focal neurologic deficits are appreciated. Skin: Skin is warm, dry and intact. No rash noted. Psychiatric: Mood and affect are normal. Speech and behavior are normal.  ____________________________________________   LABS (all labs ordered are listed, but only abnormal results are displayed)  Labs Reviewed  BASIC METABOLIC PANEL - Abnormal; Notable for the following:       Result Value   Chloride 100 (*)    Glucose, Bld 342 (*)    Creatinine, Ser 1.02 (*)    All other components within normal limits  SEDIMENTATION RATE - Abnormal; Notable for the following:    Sed Rate 59 (*)    All other components within normal limits  CULTURE, GROUP A STREP ([place])  AEROBIC CULTURE (SUPERFICIAL SPECIMEN)  CBC WITH DIFFERENTIAL/PLATELET  INFLUENZA PANEL BY PCR (TYPE A & B)  POCT RAPID STREP A   ____________________________________________  EKG  none ____________________________________________  RADIOLOGY  none ____________________________________________   PROCEDURES  Procedure(s) performed: None Procedures Critical Care  performed:  None ____________________________________________   INITIAL IMPRESSION / ASSESSMENT AND PLAN / ED COURSE  51 y.o. female who presents for evaluation of sore throat, congestion, sinus pressure, bilateral ear ache, V/D since yesterday. Patient is well-appearing, in no distress, she has a low-grade temp of 99, remaining of her vital signs are within normal limits, patient does purulent discharge coming from her left ear and bullou myringitis on the right. Oropharynx is clear. Patient with evidence of otitis externa and in the fact she is a diabetic And concerned that this could be malignant otitis externa. However she is very well-appearing at this time, normal cranial nerves, and afebrile. We'll check basic blood work. We'll start patient on antibiotics that include pseudomonas coverage. Strep is negative. Flu is pending. We'll check basic blood work for any signs of DKA.  Clinical Course as of Oct 25 939  Tue Oct 24, 2016  0933 CBC showing normal white count, patient remains afebrile, her ESR slightly elevated. She has no cranial nerve deficits at this time I have very low suspicion the patient has malignant otitis media. I have started her on Ciprodex drops which is going to be discharge on. She is to follow-up with her primary care doctor tomorrow for reevaluation. I have also provided her with a prescription for by mouth Cipro. I had a long discussion with patient about black box warning the Cipro now carries including psychiatric disorder and tendinitis/tendon rupture. I recommended that she held onto the prescription unless she starts having headache, fever, or if she is not feeling better after 24 hours on the topical Cipro drops. Explained the risks with this medication and told her that unfortunately this is the only oral antibiotics that we have that covers for pseudomonas infection. Will dc home now.  [CV]    Clinical Course User Index [CV] [place]  Alfred Levins, MD    Pertinent  labs & imaging results that were available during my care of the patient were reviewed by me and considered in my medical decision making (see chart for details).    ____________________________________________   FINAL CLINICAL IMPRESSION(S) / ED DIAGNOSES  Final diagnoses:  Upper respiratory tract infection, unspecified type  Acute otitis externa of both ears, unspecified type  Type 2 diabetes mellitus with hyperglycemia, unspecified long term insulin use status (HCC)      NEW MEDICATIONS STARTED DURING THIS VISIT:  New Prescriptions   CIPROFLOXACIN (CIPRO) 500 MG TABLET    Take 1 tablet (500 mg total) by mouth 2 (two) times daily.   CIPROFLOXACIN-DEXAMETHASONE (CIPRODEX) OTIC SUSPENSION    Place 4 drops into both ears 2 (two) times daily.     Note:  This document was prepared using Dragon voice recognition software and may include unintentional dictation errors.    Rudene Re, MD 10/24/16 [phone]

## 2016-10-24 NOTE — ED Triage Notes (Signed)
Patient ambulatory to triage with steady gait, without difficulty or distress noted; pt reports sore throat and "ears popping" since last night with sinus congestion/pressure

## 2016-10-24 NOTE — Discharge Instructions (Signed)
As I explained to you you have an infection in your ear. Apply the antibiotic drops twice a day as prescribed. If you're not feeling better after 24 hours of the antibiotic, if you have a fever, develop severe headache, double vision, or any other concerning symptoms you should start the oral antibiotic that was prescribed to you. As I explained to you this antibiotic has 2 black box warnings of very serious side effects with include psychiatric illness and tendon rupture. Unfortunately since you are a diabetic this is the only antibiotic that covers for a specific type of bacteria that causes these infections in diabetic patients.  Make sure to follow up tomorrow with your doctor for close re-evaluation.

## 2016-10-26 LAB — CULTURE, GROUP A STREP (THRC)

## 2016-10-26 LAB — AEROBIC CULTURE W GRAM STAIN (SUPERFICIAL SPECIMEN)

## 2016-10-26 LAB — AEROBIC CULTURE  (SUPERFICIAL SPECIMEN)

## 2016-10-27 ENCOUNTER — Ambulatory Visit: Payer: Self-pay | Admitting: Family Medicine

## 2016-10-27 NOTE — Progress Notes (Signed)
ED Antimicrobial Stewardship Positive Culture Follow Up   LISIA WESTBAY is an 51 y.o. female who presented to [facility] on 10/24/2016 with a chief complaint of  Chief Complaint  Patient presents with  . Sore Throat  . Nasal Congestion    Recent Results (from the past 720 hour(s))  Culture, group A strep     Status: None   Collection Time: 10/24/16  6:04 AM  Result Value Ref Range Status   Specimen Description THROAT  Final   Special Requests NONE  Final   Culture   Final    NO GROUP A STREP (S.PYOGENES) ISOLATED Performed at [facility], [address] [place], [place] [address]    Report Status 10/26/2016 FINAL  Final  Aerobic Culture (superficial specimen)     Status: None   Collection Time: 10/24/16  7:26 AM  Result Value Ref Range Status   Specimen Description EAR LEFT  Final   Special Requests NONE  Final   Gram Stain   Final    ABUNDANT WBC PRESENT, PREDOMINANTLY PMN MODERATE GRAM POSITIVE COCCOBACILLUS RARE GRAM VARIABLE ROD Performed at [facility], [place] [address], [place], [place] [address]    Culture ABUNDANT STREPTOCOCCUS PNEUMONIAE  Final   Report Status 10/26/2016 FINAL  Final   Organism ID, Bacteria STREPTOCOCCUS PNEUMONIAE  Final      Susceptibility   Streptococcus pneumoniae - MIC*    ERYTHROMYCIN <=0.12 SENSITIVE Sensitive     LEVOFLOXACIN 0.5 SENSITIVE Sensitive     PENICILLIN (meningitis) <=0.06 SENSITIVE Sensitive     PENICILLIN (non-meningitis) <=0.06 SENSITIVE Sensitive     CEFTRIAXONE (non-meningitis) <=0.12 SENSITIVE Sensitive     CEFTRIAXONE (meningitis) <=0.12 SENSITIVE Sensitive     * ABUNDANT STREPTOCOCCUS PNEUMONIAE    [x]  Treated with ciprofloxacin, organism sensitive to levaquin; however, cipro is not a respiratory fluoroquinolone. []  Patient discharged originally without antimicrobial agent and treatment is now indicated  New antibiotic prescription: Spoke to patient on the phone and patient tells me she read the 'black  box' warning and stopped taking the cipro, I informed her that if she stopped taking the antibiotic before her 10 day finish, that the infection could get worse. I counseled the patient to finish out the antibiotic course and to return to the ED for further evaluation if she felt like she was not getting better or if she was feeling worse.   Patient demonstrated understanding and agreed to finish antibiotic course.

## 2016-10-28 ENCOUNTER — Emergency Department: Payer: Medicare HMO

## 2016-10-28 ENCOUNTER — Emergency Department
Admission: EM | Admit: 2016-10-28 | Discharge: 2016-10-28 | Disposition: A | Discharge status: Home / Self Care | Payer: Medicare HMO | Attending: Emergency Medicine | Admitting: Emergency Medicine

## 2016-10-28 ENCOUNTER — Encounter: Payer: Self-pay | Admitting: Emergency Medicine

## 2016-10-28 DIAGNOSIS — H6690 Otitis media, unspecified, unspecified ear: Secondary | ICD-10-CM | POA: Diagnosis not present

## 2016-10-28 DIAGNOSIS — Y929 Unspecified place or not applicable: Secondary | ICD-10-CM | POA: Diagnosis not present

## 2016-10-28 DIAGNOSIS — I509 Heart failure, unspecified: Secondary | ICD-10-CM | POA: Insufficient documentation

## 2016-10-28 DIAGNOSIS — J449 Chronic obstructive pulmonary disease, unspecified: Secondary | ICD-10-CM | POA: Diagnosis not present

## 2016-10-28 DIAGNOSIS — Z79899 Other long term (current) drug therapy: Secondary | ICD-10-CM | POA: Insufficient documentation

## 2016-10-28 DIAGNOSIS — W010XXA Fall on same level from slipping, tripping and stumbling without subsequent striking against object, initial encounter: Secondary | ICD-10-CM | POA: Diagnosis not present

## 2016-10-28 DIAGNOSIS — Y999 Unspecified external cause status: Secondary | ICD-10-CM | POA: Insufficient documentation

## 2016-10-28 DIAGNOSIS — Z794 Long term (current) use of insulin: Secondary | ICD-10-CM | POA: Diagnosis not present

## 2016-10-28 DIAGNOSIS — I110 Hypertensive heart disease with heart failure: Secondary | ICD-10-CM | POA: Diagnosis not present

## 2016-10-28 DIAGNOSIS — J0140 Acute pansinusitis, unspecified: Secondary | ICD-10-CM

## 2016-10-28 DIAGNOSIS — E1165 Type 2 diabetes mellitus with hyperglycemia: Secondary | ICD-10-CM | POA: Diagnosis not present

## 2016-10-28 DIAGNOSIS — R739 Hyperglycemia, unspecified: Secondary | ICD-10-CM

## 2016-10-28 DIAGNOSIS — R531 Weakness: Secondary | ICD-10-CM

## 2016-10-28 DIAGNOSIS — R51 Headache: Secondary | ICD-10-CM | POA: Insufficient documentation

## 2016-10-28 DIAGNOSIS — J45909 Unspecified asthma, uncomplicated: Secondary | ICD-10-CM | POA: Diagnosis not present

## 2016-10-28 DIAGNOSIS — Y939 Activity, unspecified: Secondary | ICD-10-CM | POA: Diagnosis not present

## 2016-10-28 LAB — CBC
HCT: 39.3 % (ref 35.0–47.0)
HEMOGLOBIN: 12.9 g/dL (ref 12.0–16.0)
MCH: 28.2 pg (ref 26.0–34.0)
MCHC: 32.9 g/dL (ref 32.0–36.0)
MCV: 85.7 fL (ref 80.0–100.0)
Platelets: 333 10*3/uL (ref 150–440)
RBC: 4.59 MIL/uL (ref 3.80–5.20)
RDW: 14 % (ref 11.5–14.5)
WBC: 10 10*3/uL (ref 3.6–11.0)

## 2016-10-28 LAB — BASIC METABOLIC PANEL
ANION GAP: 15 (ref 5–15)
BUN: 18 mg/dL (ref 6–20)
CHLORIDE: 90 mmol/L — AB (ref 101–111)
CO2: 22 mmol/L (ref 22–32)
CREATININE: 1.15 mg/dL — AB (ref 0.44–1.00)
Calcium: 10.2 mg/dL (ref 8.9–10.3)
GFR calc non Af Amer: 53 mL/min — ABNORMAL LOW (ref >60)
Glucose, Bld: 450 mg/dL — ABNORMAL HIGH (ref 65–99)
Potassium: 4.7 mmol/L (ref 3.5–5.1)
Sodium: 127 mmol/L — ABNORMAL LOW (ref 135–145)

## 2016-10-28 LAB — GLUCOSE, CAPILLARY: Glucose-Capillary: 311 mg/dL — ABNORMAL HIGH (ref 65–99)

## 2016-10-28 LAB — URINALYSIS, COMPLETE (UACMP) WITH MICROSCOPIC
Bacteria, UA: NONE SEEN
Bilirubin Urine: NEGATIVE
Glucose, UA: >=500 mg/dL — AB
Ketones, ur: 5 mg/dL — AB
Leukocytes, UA: NEGATIVE
Nitrite: NEGATIVE
PROTEIN: 100 mg/dL — AB
SQUAMOUS EPITHELIAL / LPF: NONE SEEN
Specific Gravity, Urine: 1.03 (ref 1.005–1.030)
pH: 5 (ref 5.0–8.0)

## 2016-10-28 LAB — TROPONIN I

## 2016-10-28 MED ORDER — OXYMETAZOLINE HCL 0.05 % NA SOLN
1.0000 | Freq: Once | NASAL | Status: AC
  Administered 2016-10-28: 1 via NASAL
  Filled 2016-10-28: qty 15

## 2016-10-28 MED ORDER — OXYCODONE-ACETAMINOPHEN 5-325 MG PO TABS: 2.0000 | ORAL_TABLET | Freq: Four times a day (QID) | ORAL | 0 refills | Status: DC | PRN

## 2016-10-28 MED ORDER — ONDANSETRON 4 MG PO TBDP: 4.0000 mg | ORAL_TABLET | Freq: Three times a day (TID) | ORAL | 0 refills | Status: DC | PRN

## 2016-10-28 MED ORDER — SODIUM CHLORIDE 0.9 % IV SOLN
Freq: Once | INTRAVENOUS | Status: AC
  Administered 2016-10-28: 22:00:00 via INTRAVENOUS

## 2016-10-28 MED ORDER — FLUTICASONE PROPIONATE 50 MCG/ACT NA SUSP: 1.0000 | Freq: Every day | NASAL | 2 refills | Status: DC

## 2016-10-28 MED ORDER — INSULIN ASPART 100 UNIT/ML ~~LOC~~ SOLN
5.0000 [IU] | Freq: Once | SUBCUTANEOUS | Status: AC
  Administered 2016-10-28: 5 [IU] via SUBCUTANEOUS
  Filled 2016-10-28: qty 5

## 2016-10-28 MED ORDER — HYDROMORPHONE HCL 1 MG/ML IJ SOLN
0.5000 mg | Freq: Once | INTRAMUSCULAR | Status: AC
  Administered 2016-10-28: 0.5 mg via INTRAVENOUS
  Filled 2016-10-28: qty 1

## 2016-10-28 MED ORDER — SODIUM CHLORIDE 0.9 % IV SOLN
Freq: Once | INTRAVENOUS | Status: AC
  Administered 2016-10-28: 20:00:00 via INTRAVENOUS

## 2016-10-28 MED ORDER — ONDANSETRON HCL 4 MG/2ML IJ SOLN
4.0000 mg | Freq: Once | INTRAMUSCULAR | Status: AC
  Administered 2016-10-28: 4 mg via INTRAVENOUS
  Filled 2016-10-28: qty 2

## 2016-10-28 MED ORDER — SODIUM CHLORIDE 0.9 % IV SOLN
3.0000 g | Freq: Once | INTRAVENOUS | Status: AC
  Administered 2016-10-28: 3 g via INTRAVENOUS
  Filled 2016-10-28: qty 3

## 2016-10-28 NOTE — ED Triage Notes (Signed)
Pt now reports has been tripping and falling today and would like to be seen for this as well.  Feels weak all over per pt.  Has not been eating well and has had some nausea as well.

## 2016-10-28 NOTE — ED Provider Notes (Signed)
[facility] Emergency Department Provider Note        Time seen: ----------------------------------------- 6:42 PM on 10/28/2016 -----------------------------------------    I have reviewed the triage vital signs and the nursing notes.   HISTORY  Chief Complaint Otalgia and Weakness    HPI [name] is a 51 y.o. female who presents to ER after tripping and falling today. Patient feels weak all over, has not been eating well and reports some nausea. She was seen here 4 days ago and told she had bilateral ear infections. She's been using antibiotics but her ears still hurt. She denies sore throat or fever. She states fluid draining from both ears has stopped. She denies other complaints at this time.   Past Medical History:  Diagnosis Date  . Asthma   . Bipolar depression ([place])   . CHF (congestive heart failure) ([place])   . Chronic headaches   . COPD (chronic obstructive pulmonary disease) ([place])   . Diabetic neuropathy ([place])   . GERD (gastroesophageal reflux disease)   . Headache   . Heart attack   . Hyperlipidemia   . Hypertension   . Hypomagnesemia   . Hypothyroid   . Irregular heart rhythm   . Liver lesion    Favored to be benign on CT; MRI of abd w/contract in Aug 2016. Liver Biopsy-Negative, March 2016  . Morbid obesity ([place])   . OSA (obstructive sleep apnea)   . Pinched nerve    Back  . RLS (restless legs syndrome)   . Seasonal allergies   . Sleep apnea   . Type 2 diabetes, uncontrolled, with neuropathy ([place]) 10/15/2015   Overview:  Microalbumin 7.7 12/2010: Hgb A1c 10.2% 12/2010 and 9.7% 03/2011, eye exam 10/2009 [facility] DM clinic   . Urinary incontinence   . Vitamin D deficiency     Patient Active Problem List   Diagnosis Date Noted  . 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 Breast cancer screening 09/28/2016  . Encounter for screening for cervical cancer  09/28/2016  . Urine frequency 09/28/2016  . Preventative  health care 09/28/2016  . Fatigue 05/01/2016  . Urinary tract infection 03/14/2016  . Adenomatous colon polyp 10/15/2015  . Airway hyperreactivity 10/15/2015  . Bipolar affective disorder ([place]) 10/15/2015  . CN (constipation) 10/15/2015  . CAFL (chronic airflow limitation) ([place]) 10/15/2015  . Drug-induced hepatic toxicity 10/15/2015  . Blood in feces 10/15/2015  . HLD (hyperlipidemia) 10/15/2015  . Hypoxemia 10/15/2015  . NASH (nonalcoholic steatohepatitis) 10/15/2015  . Adiposity 10/15/2015  . Restless leg syndrome 10/15/2015  . Type 2 diabetes, uncontrolled, with neuropathy ([place]) 10/15/2015  . Elevated alkaline phosphatase level 09/22/2015  . Urinary bladder incontinence 09/22/2015  . Diarrhea 08/18/2015  . Low back pain with left-sided sciatica 08/18/2015  . Encounter for monitoring tricyclic antidepressant therapy 05/13/2015  . Bipolar disorder ([place]) 05/11/2015  . Liver disease 05/11/2015  . Diabetes ([place]) 05/11/2015  . Apnea, sleep 05/05/2015  . Heart valve disease 05/05/2015  . Chronic kidney disease, stage III (moderate) 03/15/2015  . Migraine 03/12/2015  . COPD (chronic obstructive pulmonary disease) ([place]) 01/20/2015  . 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 GERD (gastroesophageal reflux disease) 01/20/2015  . Disorder of smooth muscle 01/20/2015  . Hypothyroid   . Vitamin D deficiency   . Urinary incontinence   . Liver lesion   . Hypomagnesemia   . Obstructive sleep apnea 10/20/2013  . PERIPHERAL NEUROPATHY 03/04/2009  . HEMORRHOIDS, EXTERNAL 09/03/2008  .  ALLERGIC RHINITIS 07/04/2007  . ASTHMA, PERSISTENT, MODERATE 07/04/2007  . INTERSTITIAL CYSTITIS 07/04/2007  . HYPERTRIGLYCERIDEMIA 07/03/2007  . FATIGUE, CHRONIC 07/03/2007  . HYPERTENSION, BENIGN ESSENTIAL 06/19/2007    Past Surgical History:  Procedure Laterality Date  . ABDOMINAL HYSTERECTOMY    . ABDOMINAL HYSTERECTOMY     Partial  . BLADDER SURGERY     x 2  . CHOLECYSTECTOMY    . LIVER  BIOPSY  March 2016   Negative  . LUNG BIOPSY    . TUBAL LIGATION      Allergies Morphine and related; Codeine; Codeine; Hydrocodone-acetaminophen; Mirtazapine; Morphine and related; Sulfamethoxazole-trimethoprim; and Vicodin [hydrocodone-acetaminophen]  Social History Social History  Substance Use Topics  . Smoking status: Never Smoker  . Smokeless tobacco: Never Used  . Alcohol use No    Review of Systems Constitutional: Negative for fever. ENT: Positive for bilateral earache and decreased hearing Cardiovascular: Negative for chest pain. Respiratory: Negative for shortness of breath. Gastrointestinal: Negative for abdominal pain, vomiting and diarrhea. Genitourinary: Negative for dysuria. Musculoskeletal: Negative for back pain. Skin: Negative for rash. Neurological: Negative for headaches, positive for generalized weakness  10-point ROS otherwise negative.  ____________________________________________   PHYSICAL EXAM:  VITAL SIGNS: ED Triage Vitals  Enc Vitals Group     BP 10/28/16 1659 137/73     Pulse Rate 10/28/16 1659 (!) 102     Resp 10/28/16 1659 18     Temp 10/28/16 1657 99 F (37.2 C)     Temp Source 10/28/16 1657 Oral     SpO2 10/28/16 1659 96 %     Weight 10/28/16 1658 186 lb (84.4 kg)     Height 10/28/16 1658 4' 11"  (1.499 m)     Head Circumference --      Peak Flow --      Pain Score 10/28/16 1658 10     Pain Loc --      Pain Edu? --      Excl. in [place]? --     Constitutional: Alert and oriented. Well appearing and in no distress. Eyes: Conjunctivae are normal. PERRL. Normal extraocular movements. ENT   Head: Normocephalic and atraumatic.      Ears: Mildly erythematous and retracted TMs bilaterally, effusion behind both TMs   Nose: No congestion/rhinnorhea.   Mouth/Throat: Mucous membranes are moist.   Neck: No stridor. Cardiovascular: Normal rate, regular rhythm. No murmurs, rubs, or gallops. Respiratory: Normal respiratory  effort without tachypnea nor retractions. Breath sounds are clear and equal bilaterally. No wheezes/rales/rhonchi. Gastrointestinal: Soft and nontender. Normal bowel sounds Musculoskeletal: Nontender with normal range of motion in all extremities. No lower extremity tenderness nor edema. Neurologic:  Normal speech and language. No gross focal neurologic deficits are appreciated.  Skin:  Skin is warm, dry and intact. No rash noted. Psychiatric: Mood and affect are normal. Speech and behavior are normal.  ____________________________________________  EKG: Interpreted by me. Sinus rhythm rate 97 bpm, normal PR interval, normal QRS, normal QT, normal axis.  ____________________________________________  ED COURSE:  Pertinent labs & imaging results that were available during my care of the patient were reviewed by me and considered in my medical decision making (see chart for details). He presents to ER after fall and weakness with otitis media. We will assess with labs and possibly imaging. She will receive IV fluids.   Procedures ____________________________________________   LABS (pertinent positives/negatives)  Labs Reviewed  BASIC METABOLIC PANEL - Abnormal; Notable for the following:       Result Value  Sodium 127 (*)    Chloride 90 (*)    Glucose, Bld 450 (*)    Creatinine, Ser 1.15 (*)    GFR calc non Af Amer 53 (*)    All other components within normal limits  URINALYSIS, COMPLETE (UACMP) WITH MICROSCOPIC - Abnormal; Notable for the following:    Color, Urine YELLOW (*)    APPearance CLEAR (*)    Glucose, UA >=500 (*)    Hgb urine dipstick SMALL (*)    Ketones, ur 5 (*)    Protein, ur 100 (*)    All other components within normal limits  CBC  TROPONIN I  CBG MONITORING, ED    RADIOLOGY Images were viewed by me  CT head IMPRESSION: 1. No intracranial abnormality. 2. There is significant sinus disease as well as bilateral mastoid air cell opacification and  opacification of most of the middle ear cavities bilaterally. ____________________________________________  FINAL ASSESSMENT AND PLAN  Weakness, dehydration, otitis media, nausea, Pansinusitis, hyperglycemia  Plan: Patient with labs and imaging as dictated above. Patient presented to the ER with persistent symptoms likely secondary to significant sinusitis and otitis media. She was given IV Unasyn and would benefit from steroids but her blood sugars are too elevated to take systemic steroids. I will place her on Flonase as well as Zofran and Percocet. She'll continue her oral antibiotics and Ciprodex eardrops. She is stable for ENT follow-up.   Earleen Newport, MD   Note: This note was generated in part or whole with voice recognition software. Voice recognition is usually quite accurate but there are transcription errors that can and very often do occur. I apologize for any typographical errors that were not detected and corrected.     Earleen Newport, MD 10/28/16 2149

## 2016-10-28 NOTE — ED Triage Notes (Signed)
Pt seen and told had bilateral ear infection 4 days ago. Reports has been using abx but they still hurt.  Denies sore throat. Denies fevers.  Has had fluid draining from both ears but this stopped.

## 2016-10-28 NOTE — ED Notes (Signed)
Pt requesting to go home and not finish IV fluids. MD made aware

## 2016-10-28 NOTE — ED Notes (Signed)
Pt placed on 2L O2. Patient O2 dropped to 88. Patient reports she wear 2L intermittently as needed at home

## 2016-10-28 NOTE — ED Notes (Signed)
Patient transported to CT 

## 2016-11-03 ENCOUNTER — Encounter: Payer: Self-pay | Admitting: Family Medicine

## 2016-11-03 ENCOUNTER — Ambulatory Visit (INDEPENDENT_AMBULATORY_CARE_PROVIDER_SITE_OTHER): Payer: Medicare HMO | Admitting: Family Medicine

## 2016-11-03 VITALS — BP 126/78 | HR 92 | Temp 98.3°F | Resp 14 | Wt 196.0 lb

## 2016-11-03 DIAGNOSIS — IMO0002 Reserved for concepts with insufficient information to code with codable children: Secondary | ICD-10-CM

## 2016-11-03 DIAGNOSIS — E1165 Type 2 diabetes mellitus with hyperglycemia: Secondary | ICD-10-CM

## 2016-11-03 DIAGNOSIS — J0140 Acute pansinusitis, unspecified: Secondary | ICD-10-CM | POA: Diagnosis not present

## 2016-11-03 DIAGNOSIS — E1140 Type 2 diabetes mellitus with diabetic neuropathy, unspecified: Secondary | ICD-10-CM

## 2016-11-03 MED ORDER — FLUCONAZOLE 150 MG PO TABS: 150.0000 mg | ORAL_TABLET | Freq: Once | ORAL | 1 refills | Status: AC

## 2016-11-03 MED ORDER — LEVOFLOXACIN 500 MG PO TABS: 500.0000 mg | ORAL_TABLET | Freq: Every day | ORAL | 0 refills | Status: DC

## 2016-11-03 MED ORDER — AMOXICILLIN-POT CLAVULANATE 875-125 MG PO TABS: 1.0000 | ORAL_TABLET | Freq: Two times a day (BID) | ORAL | 0 refills | Status: DC

## 2016-11-03 NOTE — Progress Notes (Signed)
BP 126/78   Pulse 92   Temp 98.3 F (36.8 C) (Oral)   Resp 14   Wt 196 lb (88.9 kg)   SpO2 94%   BMI 39.59 kg/m    Subjective:    Patient ID: [name], female    DOB: 1965/11/24, 51 y.o.   MRN: [id number]  HPI: [name] is a 51 y.o. female  Chief Complaint  Patient presents with  . Ear Pain    bilateral   Patient is here for an acute visit; she had pansinusitis and has been in the ER twice for this in the last two weeks She has had stuff running out of her ears from both sides Saw Dr. Jimmye Norman on Feb 24th; had a CT scan done; all full of infection Ears infected too; they put her on drops and cipro, but the first person she saw in the ER warned her about a "black box warning" and she is no longer taking the cipro; she is in fact on no antibiotics right now; she only took the cipro for about 3 days; she didn't think she got any better on them  CLINICAL DATA:  Headache with dizziness, pt reports being treated for a double ear infection x 4 days  EXAM: CT HEAD WITHOUT CONTRAST  TECHNIQUE: Contiguous axial images were obtained from the base of the skull through the vertex without intravenous contrast.  COMPARISON:  03/16/2015  FINDINGS: Brain: No evidence of acute infarction, hemorrhage, hydrocephalus, extra-axial collection or mass lesion/mass effect.  Vascular: No hyperdense vessel or unexpected calcification.  Skull: Normal. Negative for fracture or focal lesion.  Sinuses/Orbits: There is significant sinus disease. There is marked mucosal thickening throughout the ethmoid air cells which are almost completely opacified. Mucosal thickening mostly opacifies the left and opacifies the right superior maxillary sinuses, which are incompletely imaged. Left sphenoid sinus is opacified. Moderate mucosal thickening is noted along the inferior left frontal sinus with mild inferior right frontal sinus mucosal thickening. There is mild right sphenoid sinus  mucosal thickening.  The mastoid air cells are completely opacified bilaterally. There is significant opacification of both middle ear cavities. No evidence of bone resorption.  Globes and orbits are unremarkable.  Other: None.  IMPRESSION: 1. No intracranial abnormality. 2. There is significant sinus disease as well as bilateral mastoid air cell opacification and opacification of most of the middle ear cavities bilaterally.   Electronically Signed   By: Lajean Manes M.D.   On: 10/28/2016 20:52  She has type 2 diabetes mellitus and hypertriglyceridemia; she sees the endocrinologist on Monday  Depression screen [name] 2/9 11/03/2016 09/28/2016 03/14/2016 01/06/2016  Decreased Interest 0 0 0 0  Down, Depressed, Hopeless 0 0 0 1  PHQ - 2 Score 0 0 0 1  Some recent data might be hidden   Relevant past medical, surgical, family and social history reviewed Past Medical History:  Diagnosis Date  . Asthma   . Bipolar depression ([place])   . CHF (congestive heart failure) ([place])   . Chronic headaches   . COPD (chronic obstructive pulmonary disease) ([place])   . Diabetic neuropathy ([place])   . GERD (gastroesophageal reflux disease)   . Headache   . Heart attack   . Hyperlipidemia   . Hypertension   . Hypomagnesemia   . Hypothyroid   . Irregular heart rhythm   . Liver lesion    Favored to be benign on CT; MRI of abd w/contract in Aug 2016. Liver Biopsy-Negative, March  2016  . Morbid obesity ([place])   . OSA (obstructive sleep apnea)   . Pinched nerve    Back  . RLS (restless legs syndrome)   . Seasonal allergies   . Sleep apnea   . Type 2 diabetes, uncontrolled, with neuropathy ([place]) 10/15/2015   Overview:  Microalbumin 7.7 12/2010: Hgb A1c 10.2% 12/2010 and 9.7% 03/2011, eye exam 10/2009 [facility] DM clinic   . Urinary incontinence   . Vitamin D deficiency    Past Surgical History:  Procedure Laterality Date  . ABDOMINAL HYSTERECTOMY    . ABDOMINAL HYSTERECTOMY     Partial  . BLADDER  SURGERY     x 2  . CHOLECYSTECTOMY    . LIVER BIOPSY  March 2016   Negative  . LUNG BIOPSY    . TUBAL LIGATION     Social History  Substance Use Topics  . Smoking status: Never Smoker  . Smokeless tobacco: Never Used  . Alcohol use No    Interim medical history since last visit reviewed. Allergies and medications reviewed  Review of Systems Per HPI unless specifically indicated above     Objective:    BP 126/78   Pulse 92   Temp 98.3 F (36.8 C) (Oral)   Resp 14   Wt 196 lb (88.9 kg)   SpO2 94%   BMI 39.59 kg/m   Wt Readings from Last 3 Encounters:  11/03/16 196 lb (88.9 kg)  10/28/16 186 lb (84.4 kg)  10/24/16 186 lb (84.4 kg)    Physical Exam  Constitutional: She appears well-developed and well-nourished. No distress.  HENT:  Head: Normocephalic and atraumatic.  Right Ear: Tympanic membrane is erythematous. A middle ear effusion is present.  Left Ear: Tympanic membrane is erythematous. A middle ear effusion is present.  Nose: Rhinorrhea present. No mucosal edema. No epistaxis. Right sinus exhibits maxillary sinus tenderness and frontal sinus tenderness. Left sinus exhibits maxillary sinus tenderness and frontal sinus tenderness.  Mouth/Throat: Oropharynx is clear and moist and mucous membranes are normal.  Eyes: EOM are normal. No scleral icterus.  Cardiovascular: Normal rate and regular rhythm.   Pulmonary/Chest: Effort normal and breath sounds normal.  Lymphadenopathy:    She has no cervical adenopathy.  Skin: She is not diaphoretic.  Psychiatric: She has a normal mood and affect. Her behavior is normal. Her mood appears not anxious. She does not exhibit a depressed mood.   Diabetic Foot Form - Detailed   Diabetic Foot Exam - detailed Diabetic Foot exam was performed with the following findings:  Yes 11/03/2016  1:57 PM  Visual Foot Exam completed.:  Yes  Are the toenails ingrown?:  No Normal Range of Motion:  Yes Pulse Foot Exam completed.:  Yes  Right  Dorsalis Pedis:  Present Left Dorsalis Pedis:  Present  Sensory Foot Exam Completed.:  Yes Swelling:  No Semmes-Weinstein Monofilament Test R Site 1-Great Toe:  Pos L Site 1-Great Toe:  Pos  R Site 4:  Pos L Site 4:  Pos  R Site 5:  Pos L Site 5:  Pos          Assessment & Plan:   Problem List Items Addressed This Visit      Endocrine   Type 2 diabetes, uncontrolled, with neuropathy ([place])    Foot exam by MD today; she'll see endo on Monday      Relevant Medications   aspirin EC 81 MG tablet    Other Visit Diagnoses    Acute non-recurrent  pansinusitis    -  Primary   pt on high dose seroquel; black box for levaquin, cipro; will use amoxicillin/clav; discussed referral to ENT; she wishes to wait; reasons to go to ER reviewed   Relevant Medications   fluconazole (DIFLUCAN) 150 MG tablet   amoxicillin-clavulanate (AUGMENTIN) 875-125 MG tablet       Follow up plan: No Follow-up on file.  An after-visit summary was printed and given to the patient at [facility].  Please see the patient instructions which may contain other information and recommendations beyond what is mentioned above in the assessment and plan.  Meds ordered this encounter  Medications  . Multiple Vitamin (MULTIVITAMIN) capsule    Sig: Take by mouth.  . budesonide-formoterol (SYMBICORT) 160-4.5 MCG/ACT inhaler    Sig: Inhale into the lungs.  Marland Kitchen DISCONTD: levofloxacin (LEVAQUIN) 500 MG tablet    Sig: Take 1 tablet (500 mg total) by mouth daily.    Dispense:  14 tablet    Refill:  0  . fluconazole (DIFLUCAN) 150 MG tablet    Sig: Take 1 tablet (150 mg total) by mouth once. If you get a yeast infection    Dispense:  1 tablet    Refill:  1  . amoxicillin-clavulanate (AUGMENTIN) 875-125 MG tablet    Sig: Take 1 tablet by mouth 2 (two) times daily.    Dispense:  28 tablet    Refill:  0    CANCEL the levaquin; use THIS instead  . aspirin EC 81 MG tablet    Sig: Take 1 tablet (81 mg total) by mouth daily.

## 2016-11-03 NOTE — Assessment & Plan Note (Signed)
Foot exam by MD today; she'll see endo on Monday

## 2016-11-03 NOTE — Patient Instructions (Signed)
Start the new antibiotic Use the fluconazole if you get a yeast infection Please do eat yogurt daily or take a probiotic daily for the next month or two We want to replace the healthy germs in the gut If you notice foul, watery diarrhea in the next two months, schedule an appointment RIGHT AWAY Let me know if not getting better and then we'll refer you to Ear Nose Throat If you develop swelling or redness around your face or eyes, go back to the ER

## 2016-11-07 ENCOUNTER — Telehealth: Payer: Self-pay | Admitting: Family Medicine

## 2016-11-07 ENCOUNTER — Other Ambulatory Visit: Payer: Self-pay

## 2016-11-07 NOTE — Telephone Encounter (Signed)
Anderson Malta from [employer] requesting return call [phone] Insurance changed and will not cover acuu-check but will cover one touch ultra

## 2016-11-07 NOTE — Telephone Encounter (Signed)
Anderson Malta notified at Pharmacy to get refill from Dr. Minerva Areola

## 2016-11-07 NOTE — Telephone Encounter (Signed)
She is now seeing Dr. Ronnald Collum for her diabetes, so we'll have all diabetic supplies and medicines go through his office Thank you

## 2016-11-09 NOTE — Telephone Encounter (Signed)
Patient notified will contact dr. Durwin Reges

## 2016-11-09 NOTE — Telephone Encounter (Signed)
I prescribed 300 mg ranitidine in September and then Dr. Allen Norris (GI) prescribed a PPI for her in December; please ask her to contact Dr. Allen Norris and see if he wants her on BOTH of these drugs, and if so, would he respectfully take this over? Thank you

## 2016-11-15 ENCOUNTER — Telehealth: Payer: Self-pay

## 2016-11-15 ENCOUNTER — Other Ambulatory Visit: Payer: Self-pay

## 2016-11-15 DIAGNOSIS — J0140 Acute pansinusitis, unspecified: Secondary | ICD-10-CM

## 2016-11-15 DIAGNOSIS — J324 Chronic pansinusitis: Secondary | ICD-10-CM | POA: Insufficient documentation

## 2016-11-15 MED ORDER — MAGNESIUM OXIDE 400 MG PO TABS: 400.0000 mg | ORAL_TABLET | Freq: Three times a day (TID) | ORAL | 2 refills | Status: DC

## 2016-11-15 NOTE — Telephone Encounter (Signed)
I reviewed ER notes, my last note Patient wanted to hold on seeing ENT last visit Sorry to hear she's not gotten better Refer to ENT

## 2016-11-15 NOTE — Telephone Encounter (Signed)
Patient called states when she saw you last she had sinus and ear infection, you gave her 14 day antibiotic and she finished yesterday.  She states her ear is starting to hurt again please advise?

## 2016-11-15 NOTE — Assessment & Plan Note (Signed)
Refer to ENT

## 2016-11-15 NOTE — Telephone Encounter (Signed)
Referral has been sent to [facility] ENT for review. They will contact this patient with an appt date and time.

## 2016-11-27 ENCOUNTER — Telehealth: Payer: Self-pay | Admitting: Family Medicine

## 2016-11-27 NOTE — Telephone Encounter (Signed)
Pt was on antibiotics for 14 days due to ear ache. She has completed the antibotic and have appt with [facility] ENT doctor for 12-11-16. However, the pain has returned and would like advice. [facility]  [phone]

## 2016-11-28 NOTE — Telephone Encounter (Signed)
I'm sorry but I don't want to continue any more antibiotics without re-evaluating; she's already had a recent sinus CT, so I don't want to submit her to more radiation I'll suggest moving ENT appt up or having her go to urgent care

## 2016-11-28 NOTE — Telephone Encounter (Signed)
Patient notified

## 2016-12-04 ENCOUNTER — Encounter: Payer: Self-pay | Admitting: *Deleted

## 2016-12-04 ENCOUNTER — Emergency Department: Payer: Medicare HMO

## 2016-12-04 ENCOUNTER — Emergency Department
Admission: EM | Admit: 2016-12-04 | Discharge: 2016-12-04 | Disposition: A | Discharge status: Home / Self Care | Payer: Medicare HMO | Attending: Student in an Organized Health Care Education/Training Program | Admitting: Student in an Organized Health Care Education/Training Program

## 2016-12-04 DIAGNOSIS — Z794 Long term (current) use of insulin: Secondary | ICD-10-CM | POA: Insufficient documentation

## 2016-12-04 DIAGNOSIS — Z79899 Other long term (current) drug therapy: Secondary | ICD-10-CM | POA: Insufficient documentation

## 2016-12-04 DIAGNOSIS — J45909 Unspecified asthma, uncomplicated: Secondary | ICD-10-CM | POA: Diagnosis not present

## 2016-12-04 DIAGNOSIS — M545 Low back pain: Secondary | ICD-10-CM | POA: Diagnosis present

## 2016-12-04 DIAGNOSIS — E039 Hypothyroidism, unspecified: Secondary | ICD-10-CM | POA: Insufficient documentation

## 2016-12-04 DIAGNOSIS — N183 Chronic kidney disease, stage 3 (moderate): Secondary | ICD-10-CM | POA: Diagnosis not present

## 2016-12-04 DIAGNOSIS — I509 Heart failure, unspecified: Secondary | ICD-10-CM | POA: Diagnosis not present

## 2016-12-04 DIAGNOSIS — I130 Hypertensive heart and chronic kidney disease with heart failure and stage 1 through stage 4 chronic kidney disease, or unspecified chronic kidney disease: Secondary | ICD-10-CM | POA: Diagnosis not present

## 2016-12-04 DIAGNOSIS — M791 Myalgia: Secondary | ICD-10-CM | POA: Insufficient documentation

## 2016-12-04 DIAGNOSIS — M7918 Myalgia, other site: Secondary | ICD-10-CM

## 2016-12-04 DIAGNOSIS — Z7982 Long term (current) use of aspirin: Secondary | ICD-10-CM | POA: Insufficient documentation

## 2016-12-04 DIAGNOSIS — E1122 Type 2 diabetes mellitus with diabetic chronic kidney disease: Secondary | ICD-10-CM | POA: Insufficient documentation

## 2016-12-04 MED ORDER — KETOROLAC TROMETHAMINE 60 MG/2ML IM SOLN: 30.0000 mg | Freq: Once | INTRAMUSCULAR | Status: DC

## 2016-12-04 MED ORDER — TRAMADOL HCL 50 MG PO TABS: 25.0000 mg | ORAL_TABLET | Freq: Four times a day (QID) | ORAL | 0 refills | Status: DC | PRN

## 2016-12-04 MED ORDER — ONDANSETRON HCL 4 MG PO TABS
4.0000 mg | ORAL_TABLET | Freq: Once | ORAL | Status: AC
  Administered 2016-12-04: 4 mg via ORAL
  Filled 2016-12-04: qty 1

## 2016-12-04 MED ORDER — ACETAMINOPHEN 325 MG PO TABS
650.0000 mg | ORAL_TABLET | Freq: Once | ORAL | Status: AC
  Administered 2016-12-04: 650 mg via ORAL
  Filled 2016-12-04: qty 2

## 2016-12-04 NOTE — ED Triage Notes (Signed)
States she fell yesterday and today on the same side, right side, states lower back pain and hip pain, pt ambulatory

## 2016-12-04 NOTE — ED Provider Notes (Signed)
[facility] Emergency Department Provider Note  ____________________________________________  Time seen: Approximately 2:21 PM  I have reviewed the triage vital signs and the nursing notes.   HISTORY  Chief Complaint Back Pain    HPI [name] is a 51 y.o. female that presents to the emergency department with low back pain that radiates into right hip since yesterday. Pain is primarily in the lower right side of her back. Patient states that she fell once yesterday and today. Patient states that she lost her footing. She states that the pain in her back is giving her nausea. She has been walking but with pain. She denies hitting head or losing consciousness. She denies feeling dizzy or weak before fall. She wonders if it is her ears. She was treated for an ear infection 2 weeks ago. She has an appointment with ENT on April 9. She cannot take ibuprofen because of her stomach. Took a Percocet last night, which did not help. She denies fever, headache, visual changes, neck pain, shortness of breath, chest pain,  vomiting, abdominal pain.   Past Medical History:  Diagnosis Date  . Asthma   . Bipolar depression ([place])   . CHF (congestive heart failure) ([place])   . Chronic headaches   . COPD (chronic obstructive pulmonary disease) ([place])   . Diabetic neuropathy ([place])   . GERD (gastroesophageal reflux disease)   . Headache   . Heart attack   . Hyperlipidemia   . Hypertension   . Hypomagnesemia   . Hypothyroid   . Irregular heart rhythm   . Liver lesion    Favored to be benign on CT; MRI of abd w/contract in Aug 2016. Liver Biopsy-Negative, March 2016  . Morbid obesity ([place])   . OSA (obstructive sleep apnea)   . Pinched nerve    Back  . RLS (restless legs syndrome)   . Seasonal allergies   . Sleep apnea   . Type 2 diabetes, uncontrolled, with neuropathy ([place]) 10/15/2015   Overview:  Microalbumin 7.7 12/2010: Hgb A1c 10.2% 12/2010 and 9.7% 03/2011, eye exam  10/2009 [facility] DM clinic   . Urinary incontinence   . Vitamin D deficiency     Patient Active Problem List   Diagnosis Date Noted  . Pansinusitis 11/15/2016  . 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 Breast cancer screening 09/28/2016  . Encounter for screening for cervical cancer  09/28/2016  . Urine frequency 09/28/2016  . Preventative health care 09/28/2016  . Fatigue 05/01/2016  . Urinary tract infection 03/14/2016  . Adenomatous colon polyp 10/15/2015  . Airway hyperreactivity 10/15/2015  . Bipolar affective disorder ([place]) 10/15/2015  . CN (constipation) 10/15/2015  . CAFL (chronic airflow limitation) ([place]) 10/15/2015  . Drug-induced hepatic toxicity 10/15/2015  . Blood in feces 10/15/2015  . HLD (hyperlipidemia) 10/15/2015  . Hypoxemia 10/15/2015  . NASH (nonalcoholic steatohepatitis) 10/15/2015  . Adiposity 10/15/2015  . Restless leg syndrome 10/15/2015  . Type 2 diabetes, uncontrolled, with neuropathy ([place]) 10/15/2015  . Elevated alkaline phosphatase level 09/22/2015  . Urinary bladder incontinence 09/22/2015  . Diarrhea 08/18/2015  . Low back pain with left-sided sciatica 08/18/2015  . Encounter for monitoring tricyclic antidepressant therapy 05/13/2015  . Bipolar disorder ([place]) 05/11/2015  . Liver disease 05/11/2015  . Diabetes ([place]) 05/11/2015  . Apnea, sleep 05/05/2015  . Heart valve disease 05/05/2015  . Chronic kidney disease, stage III (moderate) 03/15/2015  . Migraine 03/12/2015  . COPD (chronic obstructive pulmonary disease) ([place])  01/20/2015  . 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 GERD (gastroesophageal reflux disease) 01/20/2015  . Disorder of smooth muscle 01/20/2015  . Hypothyroid   . Vitamin D deficiency   . Urinary incontinence   . Liver lesion   . Hypomagnesemia   . Obstructive sleep apnea 10/20/2013  . PERIPHERAL NEUROPATHY 03/04/2009  . HEMORRHOIDS, EXTERNAL 09/03/2008  . ALLERGIC RHINITIS 07/04/2007  . ASTHMA,  PERSISTENT, MODERATE 07/04/2007  . INTERSTITIAL CYSTITIS 07/04/2007  . HYPERTRIGLYCERIDEMIA 07/03/2007  . FATIGUE, CHRONIC 07/03/2007  . HYPERTENSION, BENIGN ESSENTIAL 06/19/2007    Past Surgical History:  Procedure Laterality Date  . ABDOMINAL HYSTERECTOMY    . ABDOMINAL HYSTERECTOMY     Partial  . BLADDER SURGERY     x 2  . CHOLECYSTECTOMY    . LIVER BIOPSY  March 2016   Negative  . LUNG BIOPSY    . TUBAL LIGATION      Prior to Admission medications   Medication Sig Start Date End Date Taking? Authorizing Provider  ACCU-CHEK AVIVA PLUS test strip Check FSBS three times a day; LON 99 months, DX E11.40 05/10/16   Arnetha Courser, MD  ACCU-CHEK SOFTCLIX LANCETS lancets Check FSBS three times a day; LON 99 months, DX E11.40 05/10/16   Arnetha Courser, MD  albuterol (PROVENTIL HFA;VENTOLIN HFA) 108 (90 Base) MCG/ACT inhaler Inhale 2 puffs into the lungs every 4 (four) hours as needed for wheezing or shortness of breath. 11/19/15   Arnetha Courser, MD  amLODipine (NORVASC) 2.5 MG tablet Take 1 tablet (2.5 mg total) by mouth daily. 07/18/16   Arnetha Courser, MD  aspirin EC 81 MG tablet Take 1 tablet (81 mg total) by mouth daily. 11/03/16   Arnetha Courser, MD  atorvastatin (LIPITOR) 40 MG tablet Take 1 tablet (40 mg total) by mouth at bedtime. 10/19/16   Arnetha Courser, MD  budesonide-formoterol (SYMBICORT) 160-4.5 MCG/ACT inhaler Inhale into the lungs.    Historical Provider, MD  DULoxetine (CYMBALTA) 60 MG capsule Take 120 mg by mouth daily.    Historical Provider, MD  fluticasone (FLONASE) 50 MCG/ACT nasal spray Place 1 spray into both nostrils daily. 10/28/16 10/28/17  Earleen Newport, MD  Icosapent Ethyl (VASCEPA) 1 g CAPS Two pills by mouth twice a day for cholesterol (this replaces Lovaza) 09/29/16   Arnetha Courser, MD  insulin aspart (NOVOLOG) 100 UNIT/ML injection Short-acting insulin TID ac as  follows: 70-150=30units 151-200=35units 201-250=40units 251-300=45units 301-350=50units 351-400=55units 401-450=60units 09/29/16   Arnetha Courser, MD  insulin NPH Human (HUMULIN N,NOVOLIN N) 100 UNIT/ML injection Per endo: 75 units in am, 50 units at lunch and 75 with supper; subscutaneous 09/22/16   Arnetha Courser, MD  Insulin Syringe-Needle U-100 (ULTICARE INSULIN SYRINGE) 31G X 1/4" 0.3 ML MISC Inject 1 each into the skin 5 (five) times daily as needed. 09/22/16   Arnetha Courser, MD  lamoTRIgine (LAMICTAL) 150 MG tablet Take 150 mg by mouth daily.  03/22/15   Historical Provider, MD  levothyroxine (SYNTHROID, LEVOTHROID) 75 MCG tablet Take 1 tablet (75 mcg total) by mouth daily. 10/19/16   Arnetha Courser, MD  linaclotide (LINZESS) 72 MCG capsule Take 1 capsule (72 mcg total) by mouth daily before breakfast. 08/21/16   [name], MD  magnesium oxide (MAG-OX) 400 MG tablet Take 1 tablet (400 mg total) by mouth 3 (three) times daily. 11/15/16   Arnetha Courser, MD  metoprolol tartrate (LOPRESSOR) 25 MG tablet Take 0.5 tablets (12.5 mg  total) by mouth 2 (two) times daily. 05/17/16   Arnetha Courser, MD  Multiple Vitamin (MULTIVITAMIN) capsule Take by mouth.    Historical Provider, MD  ondansetron (ZOFRAN ODT) 4 MG disintegrating tablet Take 1 tablet (4 mg total) by mouth every 8 (eight) hours as needed for nausea or vomiting. 10/28/16   Earleen Newport, MD  oxyCODONE-acetaminophen (PERCOCET) 5-325 MG tablet Take 2 tablets by mouth every 6 (six) hours as needed for moderate pain or severe pain. 10/28/16   Earleen Newport, MD  pantoprazole (PROTONIX) 40 MG tablet Take 1 tablet (40 mg total) by mouth daily. 08/21/16   [name], MD  QUEtiapine (SEROQUEL) 25 MG tablet Take 25 mg by mouth every morning. Take one in am, one at lunch    Historical Provider, MD  QUEtiapine (SEROQUEL) 400 MG tablet Take 800 mg by mouth at bedtime. Take 2 tabs at bedtime    Historical Provider, MD  ranitidine (ZANTAC) 300  MG tablet Take 1 tablet (300 mg total) by mouth at bedtime. 05/15/16   Arnetha Courser, MD  REXULTI 1 MG TABS Take 1 mg by mouth daily.  07/26/15   Historical Provider, MD  rOPINIRole (REQUIP) 0.25 MG tablet One by mouth at bedtime x 1 week, then two pills at bedtime 09/28/16   Arnetha Courser, MD  traMADol (ULTRAM) 50 MG tablet Take 0.5 tablets (25 mg total) by mouth every 6 (six) hours as needed. 12/04/16 12/04/17  Laban Emperor, PA-C  zolpidem (AMBIEN) 10 MG tablet Take 10 mg by mouth at bedtime.  07/26/15   Historical Provider, MD    Allergies Morphine and related; Codeine; Codeine; Hydrocodone-acetaminophen; Mirtazapine; Morphine and related; Sulfamethoxazole-trimethoprim; and Vicodin [hydrocodone-acetaminophen]  Family History  Problem Relation Age of Onset  . Diabetes Mother   . Heart disease Mother   . Hyperlipidemia Mother   . Hypertension Mother   . Kidney disease Mother   . Mental illness Mother   . Hypothyroidism Mother   . Stroke Mother   . Osteoporosis Mother   . Glaucoma Mother   . Congestive Heart Failure Mother   . Hypertension Father   . Asthma Daughter   . Cancer Daughter     throat  . Bipolar disorder Daughter   . Heart disease Maternal Grandfather   . Rheum arthritis Maternal Grandfather   . Cancer Maternal Grandfather     liver  . Kidney disease Maternal Grandfather   . Cancer Paternal Grandmother     lung  . Kidney disease Brother   . Bipolar disorder Daughter   . Hypertension Daughter   . Restless legs syndrome Daughter     Social History Social History  Substance Use Topics  . Smoking status: Never Smoker  . Smokeless tobacco: Never Used  . Alcohol use No     Review of Systems  Constitutional: No fever/chills ENT: No upper respiratory complaints. Cardiovascular: No chest pain. Respiratory: No SOB. Gastrointestinal: No abdominal pain.  No nausea, no vomiting.  Musculoskeletal: Positive for low back pain. Skin: Negative for rash, abrasions,  lacerations, ecchymosis. Neurological: Negative for headaches, numbness or tingling   ____________________________________________   PHYSICAL EXAM:  VITAL SIGNS: ED Triage Vitals  Enc Vitals Group     BP 12/04/16 1342 122/79     Pulse Rate 12/04/16 1342 (!) 110     Resp 12/04/16 1342 18     Temp 12/04/16 1342 98.7 F (37.1 C)     Temp Source 12/04/16 1342 Oral  SpO2 12/04/16 1342 95 %     Weight 12/04/16 1343 198 lb (89.8 kg)     Height 12/04/16 1343 4' 11"  (1.499 m)     Head Circumference --      Peak Flow --      Pain Score 12/04/16 1342 9     Pain Loc --      Pain Edu? --      Excl. in [place]? --      Constitutional: Alert and oriented. Well appearing and in no acute distress. Eyes: Conjunctivae are normal. PERRL. EOMI. Head: Atraumatic. ENT:      Ears:      Nose: No congestion/rhinnorhea.      Mouth/Throat: Mucous membranes are moist.  Neck: No stridor.  No cervical spine tenderness to palpation. Cardiovascular: Normal rate, regular rhythm.  Good peripheral circulation. Respiratory: Normal respiratory effort without tachypnea or retractions. Lungs CTAB. Good air entry to the bases with no decreased or absent breath sounds. Gastrointestinal: Bowel sounds 4 quadrants. Soft and nontender to palpation. No guarding or rigidity. No palpable masses. No distention.  Musculoskeletal: Full range of motion to all extremities. No gross deformities appreciated. Tender to palpation over thoracic and lumbar spine. No tenderness to palpation over hips. Neurologic:  Normal speech and language. No gross focal neurologic deficits are appreciated.  Skin:  Skin is warm, dry and intact. No rash noted.   ____________________________________________   LABS (all labs ordered are listed, but only abnormal results are displayed)  Labs Reviewed - No data to display ____________________________________________  EKG   ____________________________________________  RADIOLOGY Robinette Haines, personally viewed and evaluated these images (plain radiographs) as part of my medical decision making, as well as reviewing the written report by the radiologist.  Dg Thoracic Spine 2 View  Result Date: 12/04/2016 CLINICAL DATA:  [place] yesterday.  Back pain. EXAM: LUMBAR SPINE - 2-3 VIEW; THORACIC SPINE 2 VIEWS COMPARISON:  None. FINDINGS: Thoracic spine: Normal alignment of the thoracic vertebral bodies. Disc spaces and vertebral bodies are maintained. Mild degenerative changes. No acute compression fracture. No abnormal paraspinal soft tissue thickening. The visualized posterior ribs are intact. Lumbar spine: Normal alignment of the lumbar vertebral bodies. Disc spaces and vertebral bodies are maintained. The facets are normally aligned. No pars defects. The visualized bony pelvis is intact. IMPRESSION: Normal alignment and no acute bony findings. Electronically Signed   By: Marijo Sanes M.D.   On: 12/04/2016 14:59   Dg Lumbar Spine 2-3 Views  Result Date: 12/04/2016 CLINICAL DATA:  [place] yesterday.  Back pain. EXAM: LUMBAR SPINE - 2-3 VIEW; THORACIC SPINE 2 VIEWS COMPARISON:  None. FINDINGS: Thoracic spine: Normal alignment of the thoracic vertebral bodies. Disc spaces and vertebral bodies are maintained. Mild degenerative changes. No acute compression fracture. No abnormal paraspinal soft tissue thickening. The visualized posterior ribs are intact. Lumbar spine: Normal alignment of the lumbar vertebral bodies. Disc spaces and vertebral bodies are maintained. The facets are normally aligned. No pars defects. The visualized bony pelvis is intact. IMPRESSION: Normal alignment and no acute bony findings. Electronically Signed   By: Marijo Sanes M.D.   On: 12/04/2016 14:59    ____________________________________________    PROCEDURES  Procedure(s) performed:    Procedures    Medications  ondansetron (ZOFRAN) tablet 4 mg (4 mg Oral Given 12/04/16 1453)  acetaminophen (TYLENOL) tablet 650  mg (650 mg Oral Given 12/04/16 1453)     ____________________________________________   INITIAL IMPRESSION / ASSESSMENT AND PLAN / ED COURSE  Pertinent labs & imaging results that were available during my care of the patient were reviewed by me and considered in my medical decision making (see chart for details).  Review of the [place] CSRS was performed in accordance of the [place] prior to dispensing any controlled drugs.     Patient's diagnosis is consistent with musculoskeletal pain. Vital signs and exam are reassuring. Thoracic and lumbar x-ray negative for acute bony abnormalities. Patient is up walking around the room without limp after Tylenol. Patient will be discharged home with prescriptions for a short course and a low dose of tramadol. Patient is to follow up with PCP as directed. Patient is given ED precautions to return to the ED for any worsening or new symptoms.     ____________________________________________  FINAL CLINICAL IMPRESSION(S) / ED DIAGNOSES  Final diagnoses:  Musculoskeletal pain      NEW MEDICATIONS STARTED DURING THIS VISIT:  Discharge Medication List as of 12/04/2016  3:30 PM    START taking these medications   Details  traMADol (ULTRAM) 50 MG tablet Take 0.5 tablets (25 mg total) by mouth every 6 (six) hours as needed., Starting Mon 12/04/2016, Until Tue 12/04/2017, Print            This chart was dictated using voice recognition software/Dragon. Despite best efforts to proofread, errors can occur which can change the meaning. Any change was purely unintentional.    Laban Emperor, PA-C 12/04/16 [place], MD 12/05/16 [phone]

## 2016-12-04 NOTE — ED Notes (Signed)
States she fell twice in 2 days   Last fall she landed on right side   Having pain to right hip area  Ambulates with slight limp d/t pain

## 2016-12-13 ENCOUNTER — Ambulatory Visit: Payer: Self-pay | Admitting: Family Medicine

## 2016-12-14 ENCOUNTER — Ambulatory Visit: Payer: Self-pay | Admitting: Family Medicine

## 2016-12-20 ENCOUNTER — Other Ambulatory Visit: Payer: Self-pay

## 2016-12-21 ENCOUNTER — Ambulatory Visit (INDEPENDENT_AMBULATORY_CARE_PROVIDER_SITE_OTHER): Payer: Medicare HMO | Admitting: Family Medicine

## 2016-12-21 ENCOUNTER — Encounter: Payer: Self-pay | Admitting: Family Medicine

## 2016-12-21 DIAGNOSIS — E1140 Type 2 diabetes mellitus with diabetic neuropathy, unspecified: Secondary | ICD-10-CM

## 2016-12-21 DIAGNOSIS — G2581 Restless legs syndrome: Secondary | ICD-10-CM

## 2016-12-21 DIAGNOSIS — I10 Essential (primary) hypertension: Secondary | ICD-10-CM | POA: Diagnosis not present

## 2016-12-21 DIAGNOSIS — IMO0002 Reserved for concepts with insufficient information to code with codable children: Secondary | ICD-10-CM

## 2016-12-21 DIAGNOSIS — E1165 Type 2 diabetes mellitus with hyperglycemia: Secondary | ICD-10-CM

## 2016-12-21 DIAGNOSIS — E781 Pure hyperglyceridemia: Secondary | ICD-10-CM | POA: Diagnosis not present

## 2016-12-21 NOTE — Patient Instructions (Addendum)
Check out the information at familydoctor.org entitled "Nutrition for Weight Loss: What You Need to Know about Fad Diets" Try to lose between 1-2 pounds per week by taking in fewer calories and burning off more calories You can succeed by limiting portions, limiting foods dense in calories and fat, becoming more active, and drinking 8 glasses of water a day (64 ounces) Don't skip meals, especially breakfast, as skipping meals may alter your metabolism Do not use over-the-counter weight loss pills or gimmicks that claim rapid weight loss A healthy BMI (or body mass index) is between 18.5 and 24.9 You can calculate your ideal BMI at the [place] website [employer]fr Let's aim for 10 pounds down before I see you in 3 months Try 4 ounces of diet tonic water an hour before bed

## 2016-12-21 NOTE — Assessment & Plan Note (Signed)
Managed now by Dr. Ronnald Collum; foot exam by MD today; encouraged weight loss

## 2016-12-21 NOTE — Progress Notes (Signed)
BP 124/78   Pulse 97   Temp 98.2 F (36.8 C) (Oral)   Resp 16   Wt 195 lb (88.5 kg)   SpO2 98%   BMI 39.39 kg/m    Subjective:    Patient ID: [name], female    DOB: 12/09/1965, 51 y.o.   MRN: [id number]  HPI: [name] is a 51 y.o. female  Chief Complaint  Patient presents with  . Fall    Pt fell twice on one day in march; Foot and legs are sore; foot are sore to the touch     HPI Type 2 diabetes; now managed by endocrinologist; she had a blood sugar of 600 yesterday; they said she wasn't holding it long enough and wasn't get all her insulin in; now on concentrated insulin; she goes back to see him next week  High TG; eating fatty foods; she met with the nutritionist about what she can eat and how much; meat the size of her palm; she has papers at home; she is going to stop eating fried foods; she is going to start walking  Obesity; down 3 pounds since last visit; will really going to try to eat better and walk more; not getting enough water  HTN; well-controlled  Restless legs symptoms coming back  Depression screen [facility] 2/9 12/21/2016 11/03/2016 09/28/2016 03/14/2016 01/06/2016  Decreased Interest 0 0 0 0 0  Down, Depressed, Hopeless 0 0 0 0 1  PHQ - 2 Score 0 0 0 0 1  Some recent data might be hidden    Relevant past medical, surgical, family and social history reviewed Past Medical History:  Diagnosis Date  . Asthma   . Bipolar depression ([place])   . CHF (congestive heart failure) ([place])   . Chronic headaches   . COPD (chronic obstructive pulmonary disease) ([place])   . Diabetic neuropathy ([place])   . GERD (gastroesophageal reflux disease)   . Headache   . Heart attack ([place])   . Hyperlipidemia   . Hypertension   . Hypomagnesemia   . Hypothyroid   . Irregular heart rhythm   . Liver lesion    Favored to be benign on CT; MRI of abd w/contract in Aug 2016. Liver Biopsy-Negative, March 2016  . Morbid obesity ([place])   . OSA (obstructive sleep apnea)   .  Pinched nerve    Back  . RLS (restless legs syndrome)   . Seasonal allergies   . Sleep apnea   . Type 2 diabetes, uncontrolled, with neuropathy ([place]) 10/15/2015   Overview:  Microalbumin 7.7 12/2010: Hgb A1c 10.2% 12/2010 and 9.7% 03/2011, eye exam 10/2009 [facility] DM clinic   . Urinary incontinence   . Vitamin D deficiency    Past Surgical History:  Procedure Laterality Date  . ABDOMINAL HYSTERECTOMY    . ABDOMINAL HYSTERECTOMY     Partial  . BLADDER SURGERY     x 2  . CHOLECYSTECTOMY    . LIVER BIOPSY  March 2016   Negative  . LUNG BIOPSY    . TUBAL LIGATION     Family History  Problem Relation Age of Onset  . Diabetes Mother   . Heart disease Mother   . Hyperlipidemia Mother   . Hypertension Mother   . Kidney disease Mother   . Mental illness Mother   . Hypothyroidism Mother   . Stroke Mother   . Osteoporosis Mother   . Glaucoma Mother   . Congestive Heart Failure Mother   .  Hypertension Father   . Asthma Daughter   . Cancer Daughter     throat  . Bipolar disorder Daughter   . Heart disease Maternal Grandfather   . Rheum arthritis Maternal Grandfather   . Cancer Maternal Grandfather     liver  . Kidney disease Maternal Grandfather   . Cancer Paternal Grandmother     lung  . Kidney disease Brother   . Bipolar disorder Daughter   . Hypertension Daughter   . Restless legs syndrome Daughter    Social History  Substance Use Topics  . Smoking status: Never Smoker  . Smokeless tobacco: Never Used  . Alcohol use No    Interim medical history since last visit reviewed. Allergies and medications reviewed  Review of Systems Per HPI unless specifically indicated above     Objective:    BP 124/78   Pulse 97   Temp 98.2 F (36.8 C) (Oral)   Resp 16   Wt 195 lb (88.5 kg)   SpO2 98%   BMI 39.39 kg/m   Wt Readings from Last 3 Encounters:  12/21/16 195 lb (88.5 kg)  12/04/16 198 lb (89.8 kg)  11/03/16 196 lb (88.9 kg)    Physical Exam  Constitutional: She  appears well-developed and well-nourished. No distress.  obese  HENT:  Head: Normocephalic and atraumatic.  Eyes: EOM are normal. No scleral icterus.  Neck: No thyromegaly present.  Cardiovascular: Normal rate, regular rhythm and normal heart sounds.   No murmur heard. Pulmonary/Chest: Effort normal and breath sounds normal. No respiratory distress. She has no wheezes.  Abdominal: Soft. Bowel sounds are normal. She exhibits no distension.  Musculoskeletal: Normal range of motion. She exhibits no edema.  Neurological: She is alert. She exhibits normal muscle tone.  Skin: Skin is warm and dry. She is not diaphoretic. No pallor.  Psychiatric: She has a normal mood and affect. Her behavior is normal. Judgment and thought content normal.   Diabetic Foot Form - Detailed   Diabetic Foot Exam - detailed Diabetic Foot exam was performed with the following findings:  Yes 12/21/2016  9:23 AM  Visual Foot Exam completed.:  Yes  Are the toenails ingrown?:  No Normal Range of Motion:  Yes Pulse Foot Exam completed.:  Yes  Right Dorsalis Pedis:  Present Left Dorsalis Pedis:  Present  Sensory Foot Exam Completed.:  Yes Swelling:  No Semmes-Weinstein Monofilament Test R Site 1-Great Toe:  Pos L Site 1-Great Toe:  Pos  R Site 4:  Pos L Site 4:  Pos  R Site 5:  Pos L Site 5:  Pos        Results for orders placed or performed during the hospital encounter of 63/87/56  Basic metabolic panel  Result Value Ref Range   Sodium 127 (L) 135 - 145 mmol/L   Potassium 4.7 3.5 - 5.1 mmol/L   Chloride 90 (L) 101 - 111 mmol/L   CO2 22 22 - 32 mmol/L   Glucose, Bld 450 (H) 65 - 99 mg/dL   BUN 18 6 - 20 mg/dL   Creatinine, Ser 1.15 (H) 0.44 - 1.00 mg/dL   Calcium 10.2 8.9 - 10.3 mg/dL   GFR calc non Af Amer 53 (L) >60 mL/min   GFR calc Af Amer >60 >60 mL/min   Anion gap 15 5 - 15  CBC  Result Value Ref Range   WBC 10.0 3.6 - 11.0 K/uL   RBC 4.59 3.80 - 5.20 MIL/uL   Hemoglobin 12.9 12.0 -  16.0 g/dL    HCT 39.3 35.0 - 47.0 %   MCV 85.7 80.0 - 100.0 fL   MCH 28.2 26.0 - 34.0 pg   MCHC 32.9 32.0 - 36.0 g/dL   RDW 14.0 11.5 - 14.5 %   Platelets 333 150 - 440 K/uL  Urinalysis, Complete w Microscopic  Result Value Ref Range   Color, Urine YELLOW (A) YELLOW   APPearance CLEAR (A) CLEAR   Specific Gravity, Urine 1.030 1.005 - 1.030   pH 5.0 5.0 - 8.0   Glucose, UA >=500 (A) NEGATIVE mg/dL   Hgb urine dipstick SMALL (A) NEGATIVE   Bilirubin Urine NEGATIVE NEGATIVE   Ketones, ur 5 (A) NEGATIVE mg/dL   Protein, ur 100 (A) NEGATIVE mg/dL   Nitrite NEGATIVE NEGATIVE   Leukocytes, UA NEGATIVE NEGATIVE   RBC / HPF 0-5 0 - 5 RBC/hpf   WBC, UA 0-5 0 - 5 WBC/hpf   Bacteria, UA NONE SEEN NONE SEEN   Squamous Epithelial / LPF NONE SEEN NONE SEEN  Troponin I  Result Value Ref Range   Troponin I <0.03 <0.03 ng/mL  Glucose, capillary  Result Value Ref Range   Glucose-Capillary 311 (H) 65 - 99 mg/dL   Comment 1 Notify RN    Comment 2 Document in Chart       Assessment & Plan:   Problem List Items Addressed This Visit      Cardiovascular and Mediastinum   HYPERTENSION, BENIGN ESSENTIAL    Well-controlled; try to follow the DASH guidelines, work on weight loss        Endocrine   Type 2 diabetes, uncontrolled, with neuropathy ([place])    Managed now by Dr. Ronnald Collum; foot exam by MD today; encouraged weight loss      Relevant Medications   HUMULIN R U-500 KWIKPEN 500 UNIT/ML kwikpen     Other   Restless leg syndrome    I'm reluctant to add even more medicine to her long list of pills; will have her try diet tonic water at night      Morbid obesity ([place])    BMI 39 plus diabetes; working on weight loss, down 3 pounds; she is going to move more and eat less and drink more water; see AVS      Relevant Medications   HUMULIN R U-500 KWIKPEN 500 UNIT/ML kwikpen   Hypertriglyceridemia    Encouraged patient to work on weight loss, cut out fried foods; she met with nutritionist just  recently          Follow up plan: Return in about 3 months (around 03/22/2017) for twenty minute follow-up with fasting labs.  An after-visit summary was printed and given to the patient at [facility].  Please see the patient instructions which may contain other information and recommendations beyond what is mentioned above in the assessment and plan.  Meds ordered this encounter  Medications  . HUMULIN R U-500 KWIKPEN 500 UNIT/ML kwikpen    Sig: 30 Units 3 (three) times daily with meals.   . hydrOXYzine (ATARAX/VISTARIL) 50 MG tablet    Sig: 50 mg daily. Take 0.5 tablet in the morning and 0.5 tablet  at night.    No orders of the defined types were placed in this encounter.

## 2016-12-21 NOTE — Assessment & Plan Note (Signed)
I'm reluctant to add even more medicine to her long list of pills; will have her try diet tonic water at night

## 2016-12-21 NOTE — Assessment & Plan Note (Addendum)
BMI 39 plus diabetes; working on weight loss, down 3 pounds; she is going to move more and eat less and drink more water; see AVS

## 2016-12-21 NOTE — Assessment & Plan Note (Signed)
Encouraged patient to work on weight loss, cut out fried foods; she met with nutritionist just recently

## 2016-12-21 NOTE — Assessment & Plan Note (Signed)
Well-controlled; try to follow the DASH guidelines, work on weight loss

## 2016-12-25 ENCOUNTER — Other Ambulatory Visit: Payer: Self-pay

## 2016-12-28 ENCOUNTER — Other Ambulatory Visit: Payer: Self-pay

## 2016-12-28 DIAGNOSIS — E781 Pure hyperglyceridemia: Secondary | ICD-10-CM

## 2016-12-28 MED ORDER — ATORVASTATIN CALCIUM 40 MG PO TABS: 40.0000 mg | ORAL_TABLET | Freq: Every day | ORAL | 2 refills | Status: DC

## 2016-12-28 MED ORDER — AMLODIPINE BESYLATE 2.5 MG PO TABS: 2.5000 mg | ORAL_TABLET | Freq: Every day | ORAL | 5 refills | Status: DC

## 2017-01-09 ENCOUNTER — Encounter: Payer: Self-pay | Admitting: Family Medicine

## 2017-01-09 ENCOUNTER — Ambulatory Visit (INDEPENDENT_AMBULATORY_CARE_PROVIDER_SITE_OTHER): Payer: Medicare HMO | Admitting: Family Medicine

## 2017-01-09 VITALS — BP 136/88 | HR 94 | Temp 98.2°F | Resp 16 | Wt 198.6 lb

## 2017-01-09 DIAGNOSIS — J0141 Acute recurrent pansinusitis: Secondary | ICD-10-CM | POA: Diagnosis not present

## 2017-01-09 DIAGNOSIS — R21 Rash and other nonspecific skin eruption: Secondary | ICD-10-CM | POA: Diagnosis not present

## 2017-01-09 MED ORDER — FLUTICASONE PROPIONATE 50 MCG/ACT NA SUSP: 2.0000 | Freq: Every day | NASAL | 11 refills | Status: DC

## 2017-01-09 MED ORDER — CLOBETASOL PROPIONATE 0.05 % EX CREA: 1.0000 "application " | TOPICAL_CREAM | Freq: Two times a day (BID) | CUTANEOUS | 0 refills | Status: DC

## 2017-01-09 MED ORDER — DOXYCYCLINE HYCLATE 100 MG PO TABS: 100.0000 mg | ORAL_TABLET | Freq: Two times a day (BID) | ORAL | 0 refills | Status: AC

## 2017-01-09 MED ORDER — FLUCONAZOLE 150 MG PO TABS: 150.0000 mg | ORAL_TABLET | Freq: Once | ORAL | 0 refills | Status: AC

## 2017-01-09 NOTE — Progress Notes (Signed)
BP 136/88   Pulse 94   Temp 98.2 F (36.8 C) (Oral)   Resp 16   Wt 198 lb 9.6 oz (90.1 kg)   SpO2 98%   BMI 40.11 kg/m    Subjective:    Patient ID: [name], female    DOB: 25-Jan-1966, 51 y.o.   MRN: [id number]  HPI: [name] is a 51 y.o. female  Chief Complaint  Patient presents with  . Insect Bite    Left neck; spreading, red and itchy. Happen 3 days ago   HPI Place came up on the right side of the neck; 2 days ago; painful Also having facial pressure and pain, headaches No fevers Having nausea Light bothers her eyes She wasn't sure if something bit her; did not feel or see anything, just rash developed Has tried hydrocortisone cream, no help She goes to see the ENT doctor in a few weeks She also has derm appt for the lesion on her RIGHT ear Dr. Ronnald Collum is handling her diabetes now; her sugars are improving, 130-190 range she says  Depression screen [facility] 2/9 01/09/2017 12/21/2016 11/03/2016 09/28/2016 03/14/2016  Decreased Interest 0 0 0 0 0  Down, Depressed, Hopeless 0 0 0 0 0  PHQ - 2 Score 0 0 0 0 0  Some recent data might be hidden   Relevant past medical, surgical, family and social history reviewed Past Medical History:  Diagnosis Date  . Asthma   . Bipolar depression ([place])   . CHF (congestive heart failure) ([place])   . Chronic headaches   . COPD (chronic obstructive pulmonary disease) ([place])   . Diabetic neuropathy ([place])   . GERD (gastroesophageal reflux disease)   . Headache   . Heart attack ([place])   . Hyperlipidemia   . Hypertension   . Hypomagnesemia   . Hypothyroid   . Irregular heart rhythm   . Liver lesion    Favored to be benign on CT; MRI of abd w/contract in Aug 2016. Liver Biopsy-Negative, March 2016  . Morbid obesity ([place])   . OSA (obstructive sleep apnea)   . Pinched nerve    Back  . RLS (restless legs syndrome)   . Seasonal allergies   . Sleep apnea   . Type 2 diabetes, uncontrolled, with neuropathy ([place]) 10/15/2015   Overview:  Microalbumin 7.7 12/2010: Hgb A1c 10.2% 12/2010 and 9.7% 03/2011, eye exam 10/2009 [facility] DM clinic   . Urinary incontinence   . Vitamin D deficiency    Past Surgical History:  Procedure Laterality Date  . ABDOMINAL HYSTERECTOMY    . ABDOMINAL HYSTERECTOMY     Partial  . BLADDER SURGERY     x 2  . CHOLECYSTECTOMY    . LIVER BIOPSY  March 2016   Negative  . LUNG BIOPSY    . TUBAL LIGATION     Social History   Social History  . Marital status: Married    Spouse name: N/A  . Number of children: 2  . Years of education: 12   Occupational History  . disabled    Social History Main Topics  . Smoking status: Never Smoker  . Smokeless tobacco: Never Used  . Alcohol use No  . Drug use: No  . Sexual activity: Yes   Other Topics Concern  . Not on file   Social History Narrative   ** Merged History Encounter **       16-20 oz of caffeine a day    Interim  medical history since last visit reviewed. Allergies and medications reviewed  Review of Systems Per HPI unless specifically indicated above     Objective:    BP 136/88   Pulse 94   Temp 98.2 F (36.8 C) (Oral)   Resp 16   Wt 198 lb 9.6 oz (90.1 kg)   SpO2 98%   BMI 40.11 kg/m   Wt Readings from Last 3 Encounters:  01/09/17 198 lb 9.6 oz (90.1 kg)  12/21/16 195 lb (88.5 kg)  12/04/16 198 lb (89.8 kg)    Physical Exam  HENT:  Right Ear: Hearing, external ear and ear canal normal. Tympanic membrane is injected and retracted. Tympanic membrane is not scarred. A middle ear effusion (yellowish) is present.  Left Ear: Hearing, tympanic membrane, external ear and ear canal normal. Tympanic membrane is not injected, not scarred and not retracted.  No middle ear effusion.  Nose: Right sinus exhibits maxillary sinus tenderness and frontal sinus tenderness. Left sinus exhibits maxillary sinus tenderness and frontal sinus tenderness.  Mouth/Throat: Oropharynx is clear and moist and mucous membranes are normal.    Lymphadenopathy:    She has no cervical adenopathy.  Skin:     Maculopapular rash on the RIGHT side of the neck, central sparing; dysaesthesia; no vesicles  Psychiatric: Her mood appears not anxious. She does not exhibit a depressed mood.   Diabetic Foot Form - Detailed   Diabetic Foot Exam - detailed Diabetic Foot exam was performed with the following findings:  Yes 01/09/2017  9:27 AM  Visual Foot Exam completed.:  Yes  Are the toenails ingrown?:  No Normal Range of Motion:  Yes Pulse Foot Exam completed.:  Yes  Right Dorsalis Pedis:  Present Left Dorsalis Pedis:  Present  Sensory Foot Exam Completed.:  Yes Swelling:  No Semmes-Weinstein Monofilament Test R Site 1-Great Toe:  Pos L Site 1-Great Toe:  Pos  R Site 4:  Pos L Site 4:  Pos  R Site 5:  Pos L Site 5:  Pos           Assessment & Plan:   Problem List Items Addressed This Visit      Respiratory   Pansinusitis - Primary   Relevant Medications   doxycycline (VIBRA-TABS) 100 MG tablet   fluconazole (DIFLUCAN) 150 MG tablet   fluticasone (FLONASE) 50 MCG/ACT nasal spray    Other Visit Diagnoses    Rash of neck       does not look like shingles or spider bite or tick bite; very atypical; start with steroid cream, call if worse       Follow up plan: No Follow-up on file.  An after-visit summary was printed and given to the patient at [facility].  Please see the patient instructions which may contain other information and recommendations beyond what is mentioned above in the assessment and plan.  Meds ordered this encounter  Medications  . ezetimibe (ZETIA) 10 MG tablet    Sig: Take 10 mg by mouth daily.   Marland Kitchen DISCONTD: NOVOLOG FLEXPEN 100 UNIT/ML FlexPen  . doxycycline (VIBRA-TABS) 100 MG tablet    Sig: Take 1 tablet (100 mg total) by mouth 2 (two) times daily.    Dispense:  20 tablet    Refill:  0  . fluconazole (DIFLUCAN) 150 MG tablet    Sig: Take 1 tablet (150 mg total) by mouth once. (do not take  cholesterol medicine for 3 days)    Dispense:  1 tablet  Refill:  0  . clobetasol cream (TEMOVATE) 0.05 %    Sig: Apply 1 application topically 2 (two) times daily. If needed; too strong for face, underarms, groin, children    Dispense:  30 g    Refill:  0  . fluticasone (FLONASE) 50 MCG/ACT nasal spray    Sig: Place 2 sprays into both nostrils daily.    Dispense:  16 g    Refill:  11    No orders of the defined types were placed in this encounter.

## 2017-01-09 NOTE — Patient Instructions (Addendum)
Start antibiotics If you develop worsening fever, headache, nausea, etc, go to the ER Use the yeast infection medicine if needed; if you do take it, don't take cholesterol medicine for 3 days Please do eat yogurt daily or take a probiotic daily for the next month or two We want to replace the healthy germs in the gut If you notice foul, watery diarrhea in the next two months, schedule an appointment RIGHT AWAY Use the new cream Keep me posted of any changes Keep appointment with the [facility] doctor and dermatologist  Sinusitis, Adult Sinusitis is soreness and inflammation of your sinuses. Sinuses are hollow spaces in the bones around your face. Your sinuses are located:  Around your eyes.  In the middle of your forehead.  Behind your nose.  In your cheekbones. Your sinuses and nasal passages are lined with a stringy fluid (mucus). Mucus normally drains out of your sinuses. When your nasal tissues become inflamed or swollen, the mucus can become trapped or blocked so air cannot flow through your sinuses. This allows bacteria, viruses, and funguses to grow, which leads to infection. Sinusitis can develop quickly and last for 7?10 days (acute) or for more than 12 weeks (chronic). Sinusitis often develops after a cold. What are the causes? This condition is caused by anything that creates swelling in the sinuses or stops mucus from draining, including:  Allergies.  Asthma.  Bacterial or viral infection.  Abnormally shaped bones between the nasal passages.  Nasal growths that contain mucus (nasal polyps).  Narrow sinus openings.  Pollutants, such as chemicals or irritants in the air.  A foreign object stuck in the nose.  A fungal infection. This is rare. What increases the risk? The following factors may make you more likely to develop this condition:  Having allergies or asthma.  Having had a recent cold or respiratory tract infection.  Having structural  deformities or blockages in your nose or sinuses.  Having a weak immune system.  Doing a lot of swimming or diving.  Overusing nasal sprays.  Smoking. What are the signs or symptoms? The main symptoms of this condition are pain and a feeling of pressure around the affected sinuses. Other symptoms include:  Upper toothache.  Earache.  Headache.  Bad breath.  Decreased sense of smell and taste.  A cough that may get worse at night.  Fatigue.  Fever.  Thick drainage from your nose. The drainage is often green and it may contain pus (purulent).  Stuffy nose or congestion.  Postnasal drip. This is when extra mucus collects in the throat or back of the nose.  Swelling and warmth over the affected sinuses.  Sore throat.  Sensitivity to light. How is this diagnosed? This condition is diagnosed based on symptoms, a medical history, and a physical exam. To find out if your condition is acute or chronic, your health care provider may:  Look in your nose for signs of nasal polyps.  Tap over the affected sinus to check for signs of infection.  View the inside of your sinuses using an imaging device that has a light attached (endoscope). If your health care provider suspects that you have chronic sinusitis, you may also:  Be tested for allergies.  Have a sample of mucus taken from your nose (nasal culture) and checked for bacteria.  Have a mucus sample examined to see if your sinusitis is related to an allergy. If your sinusitis does not respond to treatment and it lasts longer than  8 weeks, you may have an MRI or CT scan to check your sinuses. These scans also help to determine how severe your infection is. In rare cases, a bone biopsy may be done to rule out more serious types of fungal sinus disease. How is this treated? Treatment for sinusitis depends on the cause and whether your condition is chronic or acute. If a virus is causing your sinusitis, your symptoms will go  away on their own within 10 days. You may be given medicines to relieve your symptoms, including:  Topical nasal decongestants. They shrink swollen nasal passages and let mucus drain from your sinuses.  Antihistamines. These drugs block inflammation that is triggered by allergies. This can help to ease swelling in your nose and sinuses.  Topical nasal corticosteroids. These are nasal sprays that ease inflammation and swelling in your nose and sinuses.  Nasal saline washes. These rinses can help to get rid of thick mucus in your nose. If your condition is caused by bacteria, you will be given an antibiotic medicine. If your condition is caused by a fungus, you will be given an antifungal medicine. Surgery may be needed to correct underlying conditions, such as narrow nasal passages. Surgery may also be needed to remove polyps. Follow these instructions at home: Medicines   Take, use, or apply over-the-counter and prescription medicines only as told by your health care provider. These may include nasal sprays.  If you were prescribed an antibiotic medicine, take it as told by your health care provider. Do not stop taking the antibiotic even if you start to feel better. Hydrate and Humidify   Drink enough water to keep your urine clear or pale yellow. Staying hydrated will help to thin your mucus.  Use a cool mist humidifier to keep the humidity level in your home above 50%.  Inhale steam for 10-15 minutes, 3-4 times a day or as told by your health care provider. You can do this in the bathroom while a hot shower is running.  Limit your exposure to cool or dry air. Rest   Rest as much as possible.  Sleep with your head raised (elevated).  Make sure to get enough sleep each night. General instructions   Apply a warm, moist washcloth to your face 3-4 times a day or as told by your health care provider. This will help with discomfort.  Wash your hands often with soap and water to  reduce your exposure to viruses and other germs. If soap and water are not available, use hand sanitizer.  Do not smoke. Avoid being around people who are smoking (secondhand smoke).  Keep all follow-up visits as told by your health care provider. This is important. Contact a health care provider if:  You have a fever.  Your symptoms get worse.  Your symptoms do not improve within 10 days. Get help right away if:  You have a severe headache.  You have persistent vomiting.  You have pain or swelling around your face or eyes.  You have vision problems.  You develop confusion.  Your neck is stiff.  You have trouble breathing. This information is not intended to replace advice given to you by your health care provider. Make sure you discuss any questions you have with your health care provider. Document Released: 08/21/2005 Document Revised: 04/16/2016 Document Reviewed: 06/16/2015 Elsevier Interactive Patient Education  2017 [employer].

## 2017-01-16 ENCOUNTER — Emergency Department
Admission: EM | Admit: 2017-01-16 | Discharge: 2017-01-16 | Disposition: A | Discharge status: Home / Self Care | Payer: Medicare HMO | Attending: Emergency Medicine | Admitting: Emergency Medicine

## 2017-01-16 ENCOUNTER — Emergency Department: Payer: Medicare HMO

## 2017-01-16 ENCOUNTER — Encounter: Payer: Self-pay | Admitting: *Deleted

## 2017-01-16 DIAGNOSIS — Y999 Unspecified external cause status: Secondary | ICD-10-CM | POA: Insufficient documentation

## 2017-01-16 DIAGNOSIS — J45909 Unspecified asthma, uncomplicated: Secondary | ICD-10-CM | POA: Diagnosis not present

## 2017-01-16 DIAGNOSIS — S0990XA Unspecified injury of head, initial encounter: Secondary | ICD-10-CM | POA: Diagnosis present

## 2017-01-16 DIAGNOSIS — E1122 Type 2 diabetes mellitus with diabetic chronic kidney disease: Secondary | ICD-10-CM | POA: Insufficient documentation

## 2017-01-16 DIAGNOSIS — W07XXXA Fall from chair, initial encounter: Secondary | ICD-10-CM | POA: Diagnosis not present

## 2017-01-16 DIAGNOSIS — N183 Chronic kidney disease, stage 3 (moderate): Secondary | ICD-10-CM | POA: Insufficient documentation

## 2017-01-16 DIAGNOSIS — Z79899 Other long term (current) drug therapy: Secondary | ICD-10-CM | POA: Diagnosis not present

## 2017-01-16 DIAGNOSIS — Y929 Unspecified place or not applicable: Secondary | ICD-10-CM | POA: Diagnosis not present

## 2017-01-16 DIAGNOSIS — Z794 Long term (current) use of insulin: Secondary | ICD-10-CM | POA: Diagnosis not present

## 2017-01-16 DIAGNOSIS — Y939 Activity, unspecified: Secondary | ICD-10-CM | POA: Diagnosis not present

## 2017-01-16 DIAGNOSIS — I130 Hypertensive heart and chronic kidney disease with heart failure and stage 1 through stage 4 chronic kidney disease, or unspecified chronic kidney disease: Secondary | ICD-10-CM | POA: Diagnosis not present

## 2017-01-16 DIAGNOSIS — I509 Heart failure, unspecified: Secondary | ICD-10-CM | POA: Insufficient documentation

## 2017-01-16 DIAGNOSIS — S060X0A Concussion without loss of consciousness, initial encounter: Secondary | ICD-10-CM | POA: Diagnosis not present

## 2017-01-16 DIAGNOSIS — Z7982 Long term (current) use of aspirin: Secondary | ICD-10-CM | POA: Diagnosis not present

## 2017-01-16 MED ORDER — ONDANSETRON 4 MG PO TBDP
4.0000 mg | ORAL_TABLET | Freq: Once | ORAL | Status: AC
  Administered 2017-01-16: 4 mg via ORAL

## 2017-01-16 MED ORDER — KETOROLAC TROMETHAMINE 10 MG PO TABS
10.0000 mg | ORAL_TABLET | Freq: Once | ORAL | Status: AC
  Administered 2017-01-16: 10 mg via ORAL

## 2017-01-16 MED ORDER — KETOROLAC TROMETHAMINE 10 MG PO TABS
ORAL_TABLET | ORAL | Status: AC
  Administered 2017-01-16: 10 mg via ORAL
  Filled 2017-01-16: qty 1

## 2017-01-16 MED ORDER — ONDANSETRON 4 MG PO TBDP
ORAL_TABLET | ORAL | Status: AC
  Administered 2017-01-16: 4 mg via ORAL
  Filled 2017-01-16: qty 1

## 2017-01-16 NOTE — ED Triage Notes (Signed)
Pt arrived to ED from home after reportedly falling out of a rolling chair and hitting the back of head. Pt reports having lower back pain and head pain as well as dizziness and nausea at this time. Pt denies LOC.

## 2017-01-16 NOTE — ED Provider Notes (Signed)
[facility] Emergency Department Provider Note   First MD Initiated Contact with Patient 01/16/17 1347     (approximate)  I have reviewed the triage vital signs and the nursing notes.   HISTORY  Chief Complaint Fall   HPI [name] is a 51 y.o. female with below list of chronic medical conditions presents to the emergency department via EMS after accidental fall. Patient states that she attempted to sit on her chair which has wheels and it "rolled from under her" resulting in her falling hitting her occipital portion of her head. Patient also admits to low back pain. Patient denies any loss of consciousness. Patient states her current pain score is 5 out of 10. Patient denies any weakness or numbness. Patient does admit to dizziness following the fall.   Past Medical History:  Diagnosis Date  . Asthma   . Bipolar depression ([place])   . CHF (congestive heart failure) ([place])   . Chronic headaches   . COPD (chronic obstructive pulmonary disease) ([place])   . Diabetic neuropathy ([place])   . GERD (gastroesophageal reflux disease)   . Headache   . Heart attack ([place])   . Hyperlipidemia   . Hypertension   . Hypomagnesemia   . Hypothyroid   . Irregular heart rhythm   . Liver lesion    Favored to be benign on CT; MRI of abd w/contract in Aug 2016. Liver Biopsy-Negative, March 2016  . Morbid obesity ([place])   . OSA (obstructive sleep apnea)   . Pinched nerve    Back  . RLS (restless legs syndrome)   . Seasonal allergies   . Sleep apnea   . Type 2 diabetes, uncontrolled, with neuropathy ([place]) 10/15/2015   Overview:  Microalbumin 7.7 12/2010: Hgb A1c 10.2% 12/2010 and 9.7% 03/2011, eye exam 10/2009 [facility] DM clinic   . Urinary incontinence   . Vitamin D deficiency     Patient Active Problem List   Diagnosis Date Noted  . Hypertriglyceridemia 12/21/2016  . Pansinusitis 11/15/2016  . 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 Breast cancer  screening 09/28/2016  . Encounter for screening for cervical cancer  09/28/2016  . Urine frequency 09/28/2016  . Preventative health care 09/28/2016  . Fatigue 05/01/2016  . Urinary tract infection 03/14/2016  . Adenomatous colon polyp 10/15/2015  . Airway hyperreactivity 10/15/2015  . Bipolar affective disorder ([place]) 10/15/2015  . CN (constipation) 10/15/2015  . CAFL (chronic airflow limitation) ([place]) 10/15/2015  . Drug-induced hepatic toxicity 10/15/2015  . Blood in feces 10/15/2015  . HLD (hyperlipidemia) 10/15/2015  . Hypoxemia 10/15/2015  . NASH (nonalcoholic steatohepatitis) 10/15/2015  . Morbid obesity ([place]) 10/15/2015  . Restless leg syndrome 10/15/2015  . Type 2 diabetes, uncontrolled, with neuropathy ([place]) 10/15/2015  . Elevated alkaline phosphatase level 09/22/2015  . Urinary bladder incontinence 09/22/2015  . Diarrhea 08/18/2015  . Low back pain with left-sided sciatica 08/18/2015  . Encounter for monitoring tricyclic antidepressant therapy 05/13/2015  . Bipolar disorder ([place]) 05/11/2015  . Liver disease 05/11/2015  . Diabetes ([place]) 05/11/2015  . Apnea, sleep 05/05/2015  . Heart valve disease 05/05/2015  . Chronic kidney disease, stage III (moderate) 03/15/2015  . Migraine 03/12/2015  . COPD (chronic obstructive pulmonary disease) ([place]) 01/20/2015  . 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[place]) 01/20/2015  . GERD (gastroesophageal reflux disease) 01/20/2015  . Disorder of smooth muscle 01/20/2015  . Hypothyroid   . Vitamin D deficiency   . Urinary incontinence   .  Liver lesion   . Hypomagnesemia   . Obstructive sleep apnea 10/20/2013  . PERIPHERAL NEUROPATHY 03/04/2009  . HEMORRHOIDS, EXTERNAL 09/03/2008  . ALLERGIC RHINITIS 07/04/2007  . ASTHMA, PERSISTENT, MODERATE 07/04/2007  . INTERSTITIAL CYSTITIS 07/04/2007  . HYPERTRIGLYCERIDEMIA 07/03/2007  . FATIGUE, CHRONIC 07/03/2007  . HYPERTENSION, BENIGN ESSENTIAL 06/19/2007    Past Surgical History:  Procedure  Laterality Date  . ABDOMINAL HYSTERECTOMY    . ABDOMINAL HYSTERECTOMY     Partial  . BLADDER SURGERY     x 2  . CHOLECYSTECTOMY    . LIVER BIOPSY  March 2016   Negative  . LUNG BIOPSY    . TUBAL LIGATION      Prior to Admission medications   Medication Sig Start Date End Date Taking? Authorizing Provider  albuterol (PROVENTIL HFA;VENTOLIN HFA) 108 (90 Base) MCG/ACT inhaler Inhale 2 puffs into the lungs every 4 (four) hours as needed for wheezing or shortness of breath. 11/19/15  Yes Lada, Satira Anis, MD  amLODipine (NORVASC) 2.5 MG tablet Take 1 tablet (2.5 mg total) by mouth daily. 12/28/16  Yes Lada, Satira Anis, MD  aspirin EC 81 MG tablet Take 1 tablet (81 mg total) by mouth daily. 11/03/16  Yes Lada, Satira Anis, MD  budesonide-formoterol (SYMBICORT) 160-4.5 MCG/ACT inhaler Inhale into the lungs.   Yes [provider]  doxycycline (VIBRA-TABS) 100 MG tablet Take 1 tablet (100 mg total) by mouth 2 (two) times daily. 01/09/17 01/19/17 Yes Lada, Satira Anis, MD  fluticasone (FLONASE) 50 MCG/ACT nasal spray Place 2 sprays into both nostrils daily. 01/09/17 01/09/18 Yes Lada, Satira Anis, MD  HUMULIN R U-500 KWIKPEN 500 UNIT/ML kwikpen 40 Units 3 (three) times daily with meals.  12/06/16  Yes [provider]  insulin aspart (NOVOLOG) 100 UNIT/ML injection Short-acting insulin TID ac as follows: 70-150=30units 151-200=35units 201-250=40units 251-300=45units 301-350=50units 351-400=55units 401-450=60units 09/29/16  Yes Lada, Satira Anis, MD  Multiple Vitamin (MULTIVITAMIN) capsule Take by mouth.   Yes [provider]  ondansetron (ZOFRAN ODT) 4 MG disintegrating tablet Take 1 tablet (4 mg total) by mouth every 8 (eight) hours as needed for nausea or vomiting. 10/28/16  Yes Earleen Newport, MD  ACCU-CHEK AVIVA PLUS test strip Check FSBS three times a day; LON 99 months, DX E11.40 05/10/16   Lada, Satira Anis, MD  ACCU-CHEK SOFTCLIX LANCETS lancets Check FSBS three times a day; LON 99  months, DX E11.40 05/10/16   Lada, Satira Anis, MD  atorvastatin (LIPITOR) 40 MG tablet Take 1 tablet (40 mg total) by mouth at bedtime. 12/28/16   Arnetha Courser, MD  clobetasol cream (TEMOVATE) 3.41 % Apply 1 application topically 2 (two) times daily. If needed; too strong for face, underarms, groin, children Patient not taking: Reported on 01/16/2017 01/09/17   Arnetha Courser, MD  DULoxetine (CYMBALTA) 60 MG capsule Take 120 mg by mouth daily.    [provider]  ezetimibe (ZETIA) 10 MG tablet Take 10 mg by mouth daily.  01/05/17   [provider]  hydrOXYzine (ATARAX/VISTARIL) 50 MG tablet 50 mg daily. Take 0.5 tablet in the morning and 0.5 tablet  at night. 12/19/16   [provider]  Icosapent Ethyl (VASCEPA) 1 g CAPS Two pills by mouth twice a day for cholesterol (this replaces Lovaza) 09/29/16   Lada, Satira Anis, MD  Insulin Syringe-Needle U-100 (ULTICARE INSULIN SYRINGE) 31G X 1/4" 0.3 ML MISC Inject 1 each into the skin 5 (five) times daily as needed. 09/22/16   Enid Derry  P, MD  lamoTRIgine (LAMICTAL) 150 MG tablet Take 150 mg by mouth daily.  03/22/15   [provider]  levothyroxine (SYNTHROID, LEVOTHROID) 75 MCG tablet Take 1 tablet (75 mcg total) by mouth daily. 10/19/16   Arnetha Courser, MD  linaclotide Rolan Lipa) 72 MCG capsule Take 1 capsule (72 mcg total) by mouth daily before breakfast. 08/21/16   [name], MD  magnesium oxide (MAG-OX) 400 MG tablet Take 1 tablet (400 mg total) by mouth 3 (three) times daily. 11/15/16   Arnetha Courser, MD  metoprolol tartrate (LOPRESSOR) 25 MG tablet Take 0.5 tablets (12.5 mg total) by mouth 2 (two) times daily. 05/17/16   Lada, Satira Anis, MD  oxyCODONE-acetaminophen (PERCOCET) 5-325 MG tablet Take 2 tablets by mouth every 6 (six) hours as needed for moderate pain or severe pain. Patient not taking: Reported on 01/16/2017 10/28/16   Earleen Newport, MD  pantoprazole (PROTONIX) 40 MG tablet Take 1 tablet (40 mg total)  by mouth daily. 08/21/16   [name], MD  QUEtiapine (SEROQUEL) 25 MG tablet Take 25 mg by mouth every morning. Take one in am, one at lunch    [provider]  QUEtiapine (SEROQUEL) 400 MG tablet Take 800 mg by mouth at bedtime. Take 2 tabs at bedtime    [provider]  ranitidine (ZANTAC) 300 MG tablet Take 1 tablet (300 mg total) by mouth at bedtime. 05/15/16   Lada, Satira Anis, MD  traMADol (ULTRAM) 50 MG tablet Take 0.5 tablets (25 mg total) by mouth every 6 (six) hours as needed. Patient not taking: Reported on 01/16/2017 12/04/16 12/04/17  Laban Emperor, PA-C  zolpidem (AMBIEN) 10 MG tablet Take 10 mg by mouth at bedtime.  07/26/15   [provider]    Allergies Morphine and related; Codeine; Codeine; Hydrocodone-acetaminophen; Mirtazapine; Morphine and related; Sulfamethoxazole-trimethoprim; and Vicodin [hydrocodone-acetaminophen]  Family History  Problem Relation Age of Onset  . Diabetes Mother   . Heart disease Mother   . Hyperlipidemia Mother   . Hypertension Mother   . Kidney disease Mother   . Mental illness Mother   . Hypothyroidism Mother   . Stroke Mother   . Osteoporosis Mother   . Glaucoma Mother   . Congestive Heart Failure Mother   . Hypertension Father   . Asthma Daughter   . Cancer Daughter        throat  . Bipolar disorder Daughter   . Heart disease Maternal Grandfather   . Rheum arthritis Maternal Grandfather   . Cancer Maternal Grandfather        liver  . Kidney disease Maternal Grandfather   . Cancer Paternal Grandmother        lung  . Kidney disease Brother   . Bipolar disorder Daughter   . Hypertension Daughter   . Restless legs syndrome Daughter     Social History Social History  Substance Use Topics  . Smoking status: Never Smoker  . Smokeless tobacco: Never Used  . Alcohol use No    Review of Systems Constitutional: No fever/chills Eyes: No visual changes. ENT: No sore throat. Cardiovascular: Denies chest  pain. Respiratory: Denies shortness of breath. Gastrointestinal: No abdominal pain.  No nausea, no vomiting.  No diarrhea.  No constipation. Genitourinary: Negative for dysuria. Musculoskeletal: Negative for back pain. Integumentary: Negative for rash. Neurological: Negative for headaches, focal weakness or numbness.Positive for occipital head injury. Positive for dizziness   ____________________________________________   PHYSICAL EXAM:  VITAL SIGNS: ED Triage Vitals  Enc Vitals Group     BP 01/16/17 1400 (!) 149/90     Pulse Rate 01/16/17 1400 91     Resp 01/16/17 1400 16     Temp 01/16/17 1400 98.5 F (36.9 C)     Temp Source 01/16/17 1400 Oral     SpO2 01/16/17 1400 94 %     Weight 01/16/17 1403 198 lb (89.8 kg)     Height --      Head Circumference --      Peak Flow --      Pain Score 01/16/17 1400 8     Pain Loc --      Pain Edu? --      Excl. in [place]? --     Constitutional: Alert and oriented. Well appearing and in no acute distress. Eyes: Conjunctivae are normal. PERRL. EOMI. Head: Atraumatic. Ears:  Healthy appearing ear canals and TMs bilaterally Nose: No congestion/rhinnorhea. Mouth/Throat: Mucous membranes are moist.  Oropharynx non-erythematous. Neck: No stridor. No cervical spine tenderness to palpation. Cardiovascular: Normal rate, regular rhythm. Good peripheral circulation. Grossly normal heart sounds. Respiratory: Normal respiratory effort.  No retractions. Lungs CTAB. Gastrointestinal: Soft and nontender. No distention.  Musculoskeletal: No lower extremity tenderness nor edema. No gross deformities of extremities. Neurologic:  Normal speech and language. No gross focal neurologic deficits are appreciated.  Skin:  Skin is warm, dry and intact. No rash noted. Psychiatric: Mood and affect are normal. Speech and behavior are normal.   RADIOLOGY I, Hasty, personally viewed and evaluated these images (plain radiographs) as part of my medical  decision making, as well as reviewing the written report by the radiologist.  Dg Lumbar Spine 2-3 Views  Result Date: 01/16/2017 CLINICAL DATA:  Injury. EXAM: LUMBAR SPINE - 2-3 VIEW COMPARISON:  12/04/2016. FINDINGS: Surgical clips right upper quadrant. Neurostimulator noted in stable position. No acute bony abnormality. IMPRESSION: Neurostimulator noted in stable position. No acute bony abnormality. Electronically Signed   By: Marcello Moores  Register   On: 01/16/2017 14:42   Ct Head Wo Contrast  Result Date: 01/16/2017 CLINICAL DATA:  Pain following fall EXAM: CT HEAD WITHOUT CONTRAST TECHNIQUE: Contiguous axial images were obtained from the base of the skull through the vertex without intravenous contrast. COMPARISON:  October 28, 2016 FINDINGS: Brain: The ventricles are normal in size and configuration. Sulci upper normal for age. There is no intracranial mass, hemorrhage, extra-axial fluid collection, or midline shift. Gray-white compartments appear normal. No acute infarct evident. Mild basal ganglia calcification is stable and felt to be physiologic. Vascular: There is no hyperdense vessel. There is slight calcification in the left carotid siphon region. Skull: Bony calvarium appears intact. Sinuses/Orbits: Visualized paranasal sinuses are clear. Visualized orbits appear symmetric bilaterally. Other: There is opacification of multiple mastoid air cells bilaterally, a finding present on previous study. No air-fluid levels in the mastoid regions. IMPRESSION: No intracranial mass, hemorrhage, or extra-axial fluid collection. Gray-white compartments are normal. There is rather minimal calcification left carotid siphon region. There is chronic mastoid disease bilaterally. Electronically Signed   By: Lowella Grip III M.D.   On: 01/16/2017 14:34     Procedures   ____________________________________________   INITIAL IMPRESSION / ASSESSMENT AND PLAN / ED COURSE  Pertinent labs & imaging results  that were available during my care of the patient were reviewed by me and considered in my medical decision making (see chart for details).  51 year old female presenting with history of accidental fall while ascending a set on her  chair. Patient admits to occipital head injury CT scan of the head revealed no acute intracranial abnormality. Patient also admitted to the lumbar spine pain. Lumbar x-rays reveal no evidence of fracture. Patient given Toradol 10 mg tablet in the emergency department.      ____________________________________________  FINAL CLINICAL IMPRESSION(S) / ED DIAGNOSES  Final diagnoses:  Concussion without loss of consciousness, initial encounter     MEDICATIONS GIVEN DURING THIS VISIT:  Medications  ketorolac (TORADOL) tablet 10 mg (10 mg Oral Given 01/16/17 1522)  ondansetron (ZOFRAN-ODT) disintegrating tablet 4 mg (4 mg Oral Given 01/16/17 1522)     NEW OUTPATIENT MEDICATIONS STARTED DURING THIS VISIT:  New Prescriptions   No medications on file    Modified Medications   No medications on file    Discontinued Medications   No medications on file     Note:  This document was prepared using Dragon voice recognition software and may include unintentional dictation errors.    Gregor Hams, MD 01/18/17 [phone]

## 2017-01-31 ENCOUNTER — Telehealth: Payer: Self-pay

## 2017-01-31 NOTE — Telephone Encounter (Signed)
Appointment please or to urgent care I'm happy to see her later this week if she'll come in

## 2017-01-31 NOTE — Telephone Encounter (Signed)
Pt states the rash has spread to he stomach and belly button. She states you mention the cream Temovate is very strong therefore can she use it on those areas?

## 2017-01-31 NOTE — Telephone Encounter (Signed)
Pt.notified

## 2017-02-02 ENCOUNTER — Ambulatory Visit: Payer: Self-pay | Admitting: Family Medicine

## 2017-02-02 ENCOUNTER — Ambulatory Visit (INDEPENDENT_AMBULATORY_CARE_PROVIDER_SITE_OTHER): Payer: Medicare HMO | Admitting: Family Medicine

## 2017-02-02 ENCOUNTER — Encounter: Payer: Self-pay | Admitting: Family Medicine

## 2017-02-02 VITALS — BP 118/74 | HR 97 | Temp 98.3°F | Resp 16 | Wt 195.1 lb

## 2017-02-02 DIAGNOSIS — E1140 Type 2 diabetes mellitus with diabetic neuropathy, unspecified: Secondary | ICD-10-CM | POA: Diagnosis not present

## 2017-02-02 DIAGNOSIS — IMO0002 Reserved for concepts with insufficient information to code with codable children: Secondary | ICD-10-CM

## 2017-02-02 DIAGNOSIS — E1165 Type 2 diabetes mellitus with hyperglycemia: Secondary | ICD-10-CM | POA: Diagnosis not present

## 2017-02-02 DIAGNOSIS — M545 Low back pain: Secondary | ICD-10-CM | POA: Diagnosis not present

## 2017-02-02 DIAGNOSIS — R300 Dysuria: Secondary | ICD-10-CM

## 2017-02-02 DIAGNOSIS — L309 Dermatitis, unspecified: Secondary | ICD-10-CM

## 2017-02-02 LAB — POCT URINALYSIS DIPSTICK
BILIRUBIN UA: NEGATIVE
GLUCOSE UA: NEGATIVE
KETONES UA: NEGATIVE
LEUKOCYTES UA: NEGATIVE
Nitrite, UA: NEGATIVE
PH UA: 5.5 (ref 5.0–8.0)
Protein, UA: NEGATIVE
RBC UA: NEGATIVE
Spec Grav, UA: 1.015 (ref 1.010–1.025)
Urobilinogen, UA: 0.2 E.U./dL

## 2017-02-02 MED ORDER — CLOBETASOL PROPIONATE 0.05 % EX CREA: 1.0000 "application " | TOPICAL_CREAM | Freq: Two times a day (BID) | CUTANEOUS | 0 refills | Status: DC

## 2017-02-02 MED ORDER — FLUCONAZOLE 150 MG PO TABS: 150.0000 mg | ORAL_TABLET | Freq: Once | ORAL | 0 refills | Status: AC

## 2017-02-02 NOTE — Assessment & Plan Note (Signed)
Increased risk of yeast infection; with urinary symptoms and clear urine, start one pill of diflucan

## 2017-02-02 NOTE — Patient Instructions (Addendum)
Use a little bit of the cream you have on the neck and abdomen Keep areas clean and dry and don't scratch Keep nails trimmed close to avoid scratch Start the diflucan Do not take cholesterol medicine for 3 days

## 2017-02-02 NOTE — Progress Notes (Signed)
BP 118/74   Pulse 97   Temp 98.3 F (36.8 C) (Oral)   Resp 16   Wt 195 lb 1.6 oz (88.5 kg)   SpO2 96%   BMI 39.41 kg/m    Subjective:    Patient ID: [name], female    DOB: 14-Jul-1966, 51 y.o.   MRN: [id number]  HPI: STAR RESLER is a 51 y.o. female  Chief Complaint  Patient presents with  . Rash    painful and itching around belly button  . Urinary Tract Infection    low back pain and burning with urination   HPI Rash around the belly button; one week duration Painful and irritated Has a new home health nurse and wanted it checked out Goes into the belly button She has not put anything on it Few spots on the rights neck and shoulders; neck was first, then belly button Diabetic and not healing well  She thinks she has a UTI; urinary frequency, burning No fevers Strong smell to the urine; bladder pressure No blood in the urine Has been on antibiotics for sinuses; all cleared up Having little bit of discharge  Depression screen [facility] 2/9 02/02/2017 01/09/2017 12/21/2016 11/03/2016 09/28/2016  Decreased Interest 0 0 0 0 0  Down, Depressed, Hopeless 0 0 0 0 0  PHQ - 2 Score 0 0 0 0 0  Some recent data might be hidden   Relevant past medical, surgical, family and social history reviewed Past Medical History:  Diagnosis Date  . Asthma   . Bipolar depression ([place])   . CHF (congestive heart failure) ([place])   . Chronic headaches   . COPD (chronic obstructive pulmonary disease) ([place])   . Diabetic neuropathy ([place])   . GERD (gastroesophageal reflux disease)   . Headache   . Heart attack ([place])   . Hyperlipidemia   . Hypertension   . Hypomagnesemia   . Hypothyroid   . Irregular heart rhythm   . Liver lesion    Favored to be benign on CT; MRI of abd w/contract in Aug 2016. Liver Biopsy-Negative, March 2016  . Morbid obesity ([place])   . OSA (obstructive sleep apnea)   . Pinched nerve    Back  . RLS (restless legs syndrome)   . Seasonal allergies   . Sleep apnea     . Type 2 diabetes, uncontrolled, with neuropathy ([place]) 10/15/2015   Overview:  Microalbumin 7.7 12/2010: Hgb A1c 10.2% 12/2010 and 9.7% 03/2011, eye exam 10/2009 [facility] DM clinic   . Urinary incontinence   . Vitamin D deficiency    Family History  Problem Relation Age of Onset  . Diabetes Mother   . Heart disease Mother   . Hyperlipidemia Mother   . Hypertension Mother   . Kidney disease Mother   . Mental illness Mother   . Hypothyroidism Mother   . Stroke Mother   . Osteoporosis Mother   . Glaucoma Mother   . Congestive Heart Failure Mother   . Hypertension Father   . Asthma Daughter   . Cancer Daughter        throat  . Bipolar disorder Daughter   . Heart disease Maternal Grandfather   . Rheum arthritis Maternal Grandfather   . Cancer Maternal Grandfather        liver  . Kidney disease Maternal Grandfather   . Cancer Paternal Grandmother        lung  . Kidney disease Brother   . Bipolar disorder Daughter   .  Hypertension Daughter   . Restless legs syndrome Daughter    Social History   Social History  . Marital status: Married    Spouse name: N/A  . Number of children: 2  . Years of education: 12   Occupational History  . disabled    Social History Main Topics  . Smoking status: Never Smoker  . Smokeless tobacco: Never Used  . Alcohol use No  . Drug use: No  . Sexual activity: Yes   Other Topics Concern  . Not on file   Social History Narrative   ** Merged History Encounter **       16-20 oz of caffeine a day    Interim medical history since last visit reviewed. Allergies and medications reviewed  Review of Systems Per HPI unless specifically indicated above     Objective:    BP 118/74   Pulse 97   Temp 98.3 F (36.8 C) (Oral)   Resp 16   Wt 195 lb 1.6 oz (88.5 kg)   SpO2 96%   BMI 39.41 kg/m   Wt Readings from Last 3 Encounters:  02/02/17 195 lb 1.6 oz (88.5 kg)  01/16/17 198 lb (89.8 kg)  01/09/17 198 lb 9.6 oz (90.1 kg)    Physical Exam   Constitutional: She appears well-developed and well-nourished. No distress.  Cardiovascular: Normal rate.   Pulmonary/Chest: Effort normal.  Skin: Rash (excoriated erythematous rash on the abdomen; few spots on the right side of neck; no vesicles; does not appear consistent with shingles) noted. No pallor.     Psychiatric: Her mood appears not anxious. She does not exhibit a depressed mood.    Results for orders placed or performed in visit on 02/02/17  POCT urinalysis dipstick  Result Value Ref Range   Color, UA yellow    Clarity, UA clear    Glucose, UA neg    Bilirubin, UA neg    Ketones, UA neg    Spec Grav, UA 1.015 1.010 - 1.025   Blood, UA neg    pH, UA 5.5 5.0 - 8.0   Protein, UA neg    Urobilinogen, UA 0.2 0.2 or 1.0 E.U./dL   Nitrite, UA neg    Leukocytes, UA Negative Negative      Assessment & Plan:   Problem List Items Addressed This Visit      Endocrine   Type 2 diabetes, uncontrolled, with neuropathy (HCC)    Increased risk of yeast infection; with urinary symptoms and clear urine, start one pill of diflucan       Other Visit Diagnoses    Dermatitis    -  Primary   will use the topical corticosteroid; explained prolonged use on thin skin can cause stretch marks   Low back pain, unspecified back pain laterality, unspecified chronicity, with sciatica presence unspecified       urine did not show infection or blood   Relevant Orders   POCT urinalysis dipstick (Completed)   Burning with urination       urine reviewed; possible yeast infection; rx for diflucan   Relevant Orders   POCT urinalysis dipstick (Completed)       Follow up plan: No Follow-up on file.  An after-visit summary was printed and given to the patient at [facility].  Please see the patient instructions which may contain other information and recommendations beyond what is mentioned above in the assessment and plan.  Meds ordered this encounter  Medications  . clobetasol cream  (TEMOVATE)  0.05 %    Sig: Apply 1 application topically 2 (two) times daily. If needed; too strong for face, underarms, groin, children    Dispense:  30 g    Refill:  0  . fluconazole (DIFLUCAN) 150 MG tablet    Sig: Take 1 tablet (150 mg total) by mouth once. Do not take cholesterol medicine for 3 days    Dispense:  1 tablet    Refill:  0    Orders Placed This Encounter  Procedures  . POCT urinalysis dipstick

## 2017-02-12 ENCOUNTER — Encounter: Payer: Self-pay | Admitting: Dietician

## 2017-02-12 ENCOUNTER — Encounter: Payer: Medicare HMO | Attending: Endocrinology | Admitting: Dietician

## 2017-02-12 VITALS — BP 134/90 | Ht 59.0 in | Wt 196.5 lb

## 2017-02-12 DIAGNOSIS — E1165 Type 2 diabetes mellitus with hyperglycemia: Secondary | ICD-10-CM

## 2017-02-12 DIAGNOSIS — Z713 Dietary counseling and surveillance: Secondary | ICD-10-CM | POA: Diagnosis not present

## 2017-02-12 DIAGNOSIS — E669 Obesity, unspecified: Secondary | ICD-10-CM | POA: Diagnosis not present

## 2017-02-12 DIAGNOSIS — E119 Type 2 diabetes mellitus without complications: Secondary | ICD-10-CM | POA: Insufficient documentation

## 2017-02-12 NOTE — Patient Instructions (Addendum)
  Check blood sugars 3 x day before each meals every day and record Bring blood sugar records to the next appointment/class Eat 3 meals day,  1  snack a day at bedtime Space meals 4-5 hours apart Avoid sugar sweetened drinks (soda, tea, coffee, sports drinks, juices) Drink plenty of water Limit intake of fried foods, sweets and snack foods Make healthy food choices If hungry between meals, try snacking on low carb vegetables Eat 2-3 carbohydrate servings/meal + protein Eat 1 carbohydrate serving/snack + protein Make a dentist / eye doctor appointments Carry fast acting glucose and a snack at all times Rotate injection sites Take Novolog 15 min before meals if able Carry medical alert ID card at all times Contact MD about Ambien and memory issues at night Return for appointment/classes on:  02-26-17
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
## 2017-02-19 ENCOUNTER — Telehealth: Payer: Self-pay | Admitting: Dietician

## 2017-02-19 LAB — HEMOGLOBIN A1C: HEMOGLOBIN A1C: 11.7

## 2017-02-19 NOTE — Telephone Encounter (Signed)
Called pt for update on BG's-pt reports she is taking U500 40 units TID ac meals + Novolog TID ac meals  for BG's >200 per sliding scale-FBG's past wk [phone], ac lunch [phone] and ac supper [phone]. Faxed BG's to MD

## 2017-02-26 ENCOUNTER — Encounter: Payer: Medicare HMO | Admitting: Dietician

## 2017-02-26 ENCOUNTER — Encounter: Payer: Self-pay | Admitting: Dietician

## 2017-02-26 VITALS — Ht 59.0 in | Wt 199.2 lb

## 2017-02-26 DIAGNOSIS — Z713 Dietary counseling and surveillance: Secondary | ICD-10-CM | POA: Diagnosis not present

## 2017-02-26 DIAGNOSIS — E1165 Type 2 diabetes mellitus with hyperglycemia: Secondary | ICD-10-CM

## 2017-02-26 NOTE — Progress Notes (Signed)

## 2017-03-12 ENCOUNTER — Telehealth: Payer: Self-pay | Admitting: Dietician

## 2017-03-12 ENCOUNTER — Ambulatory Visit: Payer: Medicare HMO

## 2017-03-12 NOTE — Telephone Encounter (Signed)
Pt called and left message to cancel class 2 tonight due to being sick. Pt reports BG's are doing better 100's-200's. Will reschedule class 2 when she returns to class 3 on 03-19-17

## 2017-03-19 ENCOUNTER — Encounter: Payer: Self-pay | Admitting: Dietician

## 2017-03-19 ENCOUNTER — Ambulatory Visit: Payer: Medicare HMO

## 2017-03-19 NOTE — Progress Notes (Signed)
Pt called to cancel tonight's class  And left message she wants to reschedule classes 2 and 3. Called pt and reschedule class 2 on 04-09-17 and class 3 on 04-16-17

## 2017-03-21 ENCOUNTER — Encounter: Payer: Self-pay | Admitting: Family Medicine

## 2017-03-21 ENCOUNTER — Ambulatory Visit (INDEPENDENT_AMBULATORY_CARE_PROVIDER_SITE_OTHER): Payer: Medicare HMO | Admitting: Family Medicine

## 2017-03-21 VITALS — BP 128/86 | HR 100 | Temp 98.1°F | Resp 14 | Wt 198.9 lb

## 2017-03-21 DIAGNOSIS — E1165 Type 2 diabetes mellitus with hyperglycemia: Secondary | ICD-10-CM | POA: Diagnosis not present

## 2017-03-21 DIAGNOSIS — Z1239 Encounter for other screening for malignant neoplasm of breast: Secondary | ICD-10-CM

## 2017-03-21 DIAGNOSIS — Z794 Long term (current) use of insulin: Secondary | ICD-10-CM | POA: Diagnosis not present

## 2017-03-21 DIAGNOSIS — Z1231 Encounter for screening mammogram for malignant neoplasm of breast: Secondary | ICD-10-CM

## 2017-03-21 DIAGNOSIS — B373 Candidiasis of vulva and vagina: Secondary | ICD-10-CM

## 2017-03-21 DIAGNOSIS — B3731 Acute candidiasis of vulva and vagina: Secondary | ICD-10-CM

## 2017-03-21 DIAGNOSIS — E781 Pure hyperglyceridemia: Secondary | ICD-10-CM | POA: Diagnosis not present

## 2017-03-21 DIAGNOSIS — G2581 Restless legs syndrome: Secondary | ICD-10-CM | POA: Diagnosis not present

## 2017-03-21 MED ORDER — FLUCONAZOLE 150 MG PO TABS
150.0000 mg | ORAL_TABLET | Freq: Once | ORAL | 0 refills | Status: AC
Start: 2017-03-21 — End: 2017-03-21

## 2017-03-21 MED ORDER — GABAPENTIN 300 MG PO CAPS: ORAL_CAPSULE | ORAL | 0 refills | Status: DC

## 2017-03-21 NOTE — Assessment & Plan Note (Signed)
Start back on gabapentin

## 2017-03-21 NOTE — Assessment & Plan Note (Signed)
Managed by endo; foot exam here by MD today; patient was encouraged to get an eye exam; she will call for appt; praise given for attending classes, eating better, getting sugars controlled

## 2017-03-21 NOTE — Progress Notes (Signed)
BP 128/86   Pulse 100   Temp 98.1 F (36.7 C) (Oral)   Resp 14   Wt 198 lb 14.4 oz (90.2 kg)   SpO2 97%   BMI 40.17 kg/m    Subjective:    Patient ID: [name], female    DOB: 06/04/1966, 51 y.o.   MRN: [id number]  HPI: [name] is a 51 y.o. female  Chief Complaint  Patient presents with  . Follow-up    HPI Patient is here for follow-up She thinks she either has a UTI or a yeast infection; burning and itching down below; started on the new medicine Type 2 diabetes mellitus; her endo doctor started her on new medicine, and she done diabetic education classes; eating better; now sugars are on the 100s and not needing rescue insulin; he is checking her A1c High TG; those have come down too; endo is checking cholesterol She has pain in the feet and lower legs; wears flip flops; hurts in the front; like restless legs, has had that before Burning in the bottom of the feet too She is due for eye exam; she'll call for appointment BP controlled today; not trying to limit her salt She weighed 200 pounds at endo office and now under 200 She is seeing psychiatrist; had to find another psychiatrist, called Dr. Nicolasa Ducking and has an appt with her Aug 6th Due for more labs Aug 3rd GERD; okay on zantac Hypothyroidism; weight stable; moving bowels well Home health nurse comes out twice a week, now coming once a week now; he ordered that too  Depression screen [facility] 2/9 03/21/2017 02/12/2017 02/02/2017 01/09/2017 12/21/2016  Decreased Interest 0 0 0 0 0  Down, Depressed, Hopeless 0 0 0 0 0  PHQ - 2 Score 0 0 0 0 0  Some recent data might be hidden   Relevant past medical, surgical, family and social history reviewed Past Medical History:  Diagnosis Date  . Asthma   . Bipolar depression ([place])   . CHF (congestive heart failure) ([place])   . Chronic headaches   . COPD (chronic obstructive pulmonary disease) ([place])   . Diabetic neuropathy ([place])   . GERD (gastroesophageal reflux disease)     . Headache   . Heart attack ([place])   . Hyperlipidemia   . Hypertension   . Hypomagnesemia   . Hypothyroid   . Irregular heart rhythm   . Liver lesion    Favored to be benign on CT; MRI of abd w/contract in Aug 2016. Liver Biopsy-Negative, March 2016  . Morbid obesity ([place])   . OSA (obstructive sleep apnea)   . Pinched nerve    Back  . RLS (restless legs syndrome)   . Seasonal allergies   . Sleep apnea   . Type 2 diabetes, uncontrolled, with neuropathy ([place]) 10/15/2015   Overview:  Microalbumin 7.7 12/2010: Hgb A1c 10.2% 12/2010 and 9.7% 03/2011, eye exam 10/2009 [facility]   . Urinary incontinence   . Vitamin D deficiency    Past Surgical History:  Procedure Laterality Date  . ABDOMINAL HYSTERECTOMY    . ABDOMINAL HYSTERECTOMY     Partial  . BLADDER SURGERY     x 2  . CHOLECYSTECTOMY    . LIVER BIOPSY  March 2016   Negative  . LUNG BIOPSY    . TUBAL LIGATION     Family History  Problem Relation Age of Onset  . Diabetes Mother   . Heart disease Mother   .  Hyperlipidemia Mother   . Hypertension Mother   . Kidney disease Mother   . Mental illness Mother   . Hypothyroidism Mother   . Stroke Mother   . Osteoporosis Mother   . Glaucoma Mother   . Congestive Heart Failure Mother   . Hypertension Father   . Asthma Daughter   . Cancer Daughter        throat  . Bipolar disorder Daughter   . Heart disease Maternal Grandfather   . Rheum arthritis Maternal Grandfather   . Cancer Maternal Grandfather        liver  . Kidney disease Maternal Grandfather   . Cancer Paternal Grandmother        lung  . Kidney disease Brother   . Bipolar disorder Daughter   . Hypertension Daughter   . Restless legs syndrome Daughter    Social History   Social History  . Marital status: Married    Spouse name: N/A  . Number of children: 2  . Years of education: 12   Occupational History  . disabled    Social History Main Topics  . Smoking status: Never Smoker  . Smokeless  tobacco: Never Used  . Alcohol use No  . Drug use: No  . Sexual activity: Yes   Other Topics Concern  . Not on file   Social History Narrative   ** Merged History Encounter **       16-20 oz of caffeine a day     Interim medical history since last visit reviewed. Allergies and medications reviewed  Review of Systems Per HPI unless specifically indicated above     Objective:    BP 128/86   Pulse 100   Temp 98.1 F (36.7 C) (Oral)   Resp 14   Wt 198 lb 14.4 oz (90.2 kg)   SpO2 97%   BMI 40.17 kg/m   Wt Readings from Last 3 Encounters:  03/21/17 198 lb 14.4 oz (90.2 kg)  02/26/17 199 lb 3.2 oz (90.4 kg)  02/12/17 196 lb 8 oz (89.1 kg)    Physical Exam  Constitutional: She appears well-developed and well-nourished. No distress.  Morbidly obese  HENT:  Head: Normocephalic and atraumatic.  Eyes: EOM are normal. No scleral icterus.  Neck: No thyromegaly present.  Cardiovascular: Normal rate, regular rhythm and normal heart sounds.   No murmur heard. Pulmonary/Chest: Effort normal and breath sounds normal. No respiratory distress. She has no wheezes.  Abdominal: Soft. Bowel sounds are normal. She exhibits no distension.  Musculoskeletal: Normal range of motion. She exhibits no edema.  No redness of the legs; no calf tenderness,   Neurological: She is alert. She exhibits normal muscle tone.  Skin: Skin is warm and dry. She is not diaphoretic. No pallor.  Psychiatric: She has a normal mood and affect. Her behavior is normal. Judgment and thought content normal.   Diabetic Foot Form - Detailed   Diabetic Foot Exam - detailed Diabetic Foot exam was performed with the following findings:  Yes 03/21/2017  8:16 AM  Visual Foot Exam completed.:  Yes  Pulse Foot Exam completed.:  Yes  Right Dorsalis Pedis:  Present Left Dorsalis Pedis:  Present  Sensory Foot Exam Completed.:  Yes Semmes-Weinstein Monofilament Test R Site 1-Great Toe:  Pos L Site 1-Great Toe:  Pos         Results for orders placed or performed in visit on 02/02/17  POCT urinalysis dipstick  Result Value Ref Range   Color, UA yellow  Clarity, UA clear    Glucose, UA neg    Bilirubin, UA neg    Ketones, UA neg    Spec Grav, UA 1.015 1.010 - 1.025   Blood, UA neg    pH, UA 5.5 5.0 - 8.0   Protein, UA neg    Urobilinogen, UA 0.2 0.2 or 1.0 E.U./dL   Nitrite, UA neg    Leukocytes, UA Negative Negative      Assessment & Plan:   Problem List Items Addressed This Visit      Endocrine   Diabetes ([place]) - Primary    Managed by endo; foot exam here by MD today; patient was encouraged to get an eye exam; she will call for appt; praise given for attending classes, eating better, getting sugars controlled      Relevant Medications   JARDIANCE 25 MG TABS tablet     Other   Restless leg syndrome    Start back on gabapentin      Morbid obesity ([place])    Encouraged her to work on weight loss      Relevant Medications   JARDIANCE 25 MG TABS tablet   Hypertriglyceridemia    Managed now by endo; glad to hear that her numbers are coming down      Breast cancer screening    Mammogram still not done; encouraged patient to have that scheduled soon, number given for her to call       Other Visit Diagnoses    Vaginal candidiasis       treat with fluconazole; if symptoms persist, notify me; likely secondary to jardiance or diabetes   Relevant Medications   fluconazole (DIFLUCAN) 150 MG tablet   Restless legs           Follow up plan: Return in about 6 months (around 09/21/2017) for twenty minute follow-up with fasting labs.  An after-visit summary was printed and given to the patient at [facility].  Please see the patient instructions which may contain other information and recommendations beyond what is mentioned above in the assessment and plan.  Meds ordered this encounter  Medications  . JARDIANCE 25 MG TABS tablet    Sig: Take 25 mg by mouth daily.  Marland Kitchen gabapentin  (NEURONTIN) 300 MG capsule    Sig: One pill by mouth at bedtime for three nights, then one pill twice a day    Dispense:  57 capsule    Refill:  0  . fluconazole (DIFLUCAN) 150 MG tablet    Sig: Take 1 tablet (150 mg total) by mouth once.    Dispense:  1 tablet    Refill:  0    No orders of the defined types were placed in this encounter.

## 2017-03-21 NOTE — Assessment & Plan Note (Signed)
Encouraged her to work on weight loss

## 2017-03-21 NOTE — Patient Instructions (Addendum)
Let's start back on the gabapentin Call for refill when due The Jardiance should actually help you in your efforts for weight loss Please do call to schedule your mammogram; the number to schedule one at either [facility] or [facility] is [phone] Please do call your eye doctor to make an appointment Check out the information at [email or web address] entitled "Nutrition for Weight Loss: What You Need to Know about Fad Diets" Try to lose between 1-2 pounds per week by taking in fewer calories and burning off more calories You can succeed by limiting portions, limiting foods dense in calories and fat, becoming more active, and drinking 8 glasses of water a day (64 ounces) Don't skip meals, especially breakfast, as skipping meals may alter your metabolism Do not use over-the-counter weight loss pills or gimmicks that claim rapid weight loss A healthy BMI (or body mass index) is between 18.5 and 24.9 You can calculate your ideal BMI at the [place] website [employer] Take the fluconazole and contact me if symptoms like burning and itching persist

## 2017-03-21 NOTE — Assessment & Plan Note (Signed)
Mammogram still not done; encouraged patient to have that scheduled soon, number given for her to call

## 2017-03-21 NOTE — Assessment & Plan Note (Signed)
Managed now by endo; glad to hear that her numbers are coming down

## 2017-03-29 ENCOUNTER — Telehealth: Payer: Self-pay

## 2017-03-30 NOTE — Telephone Encounter (Signed)
error 

## 2017-04-09 ENCOUNTER — Telehealth: Payer: Self-pay | Admitting: *Deleted

## 2017-04-09 ENCOUNTER — Other Ambulatory Visit: Payer: Self-pay

## 2017-04-09 ENCOUNTER — Encounter: Payer: Self-pay | Admitting: Dietician

## 2017-04-09 DIAGNOSIS — E781 Pure hyperglyceridemia: Secondary | ICD-10-CM

## 2017-04-09 NOTE — Telephone Encounter (Signed)
Patient has an appt wed with you.

## 2017-04-09 NOTE — Telephone Encounter (Signed)
Left detailed voicemail

## 2017-04-09 NOTE — Progress Notes (Signed)
Pt called and cancelledd class 2 tonight-will return for class 3 on 04-16-17

## 2017-04-09 NOTE — Telephone Encounter (Signed)
I think Dr. Ronnald Collum is taking care of her lipids now; please call her and double check; if so, refill request goes to him; thank you

## 2017-04-09 NOTE — Telephone Encounter (Signed)
Received call from patient. She reports her feet are swollen and she can't walk. She has an appt with her PCP this week. The patient will not be able to attend Diabetes Class 2 tonight. Instructed her to come next week to Class 3 and reschedule Class 2 at that time.

## 2017-04-10 ENCOUNTER — Ambulatory Visit: Payer: Self-pay | Admitting: Family Medicine

## 2017-04-11 ENCOUNTER — Ambulatory Visit (INDEPENDENT_AMBULATORY_CARE_PROVIDER_SITE_OTHER): Payer: Medicare HMO | Admitting: Family Medicine

## 2017-04-11 ENCOUNTER — Encounter: Payer: Self-pay | Admitting: Family Medicine

## 2017-04-11 VITALS — BP 116/68 | HR 98 | Temp 98.2°F | Resp 16 | Wt 198.6 lb

## 2017-04-11 DIAGNOSIS — H6982 Other specified disorders of Eustachian tube, left ear: Secondary | ICD-10-CM

## 2017-04-11 DIAGNOSIS — M79672 Pain in left foot: Secondary | ICD-10-CM | POA: Diagnosis not present

## 2017-04-11 MED ORDER — GABAPENTIN 300 MG PO CAPS: 300.0000 mg | ORAL_CAPSULE | Freq: Two times a day (BID) | ORAL | 5 refills | Status: DC

## 2017-04-11 NOTE — Patient Instructions (Signed)
Try the neti pot to rinse out your sinuses Talk to your pharmacist about safe use of the neti pot Use Afrin twice a day for THREE DAYS ONLY, then STOP Spray in the left nostril, then lay back on the bed; when you feel the medicine going down the back of your sinus passages, then roll on to your left side and lay on the ear for about a minute We'll have you see the podiatrist

## 2017-04-11 NOTE — Progress Notes (Signed)
BP 116/68   Pulse 98   Temp 98.2 F (36.8 C) (Oral)   Resp 16   Wt 198 lb 9.6 oz (90.1 kg)   SpO2 95%   BMI 40.11 kg/m    Subjective:    Patient ID: [name], female    DOB: 08-Nov-1965, 51 y.o.   MRN: [id number]  HPI: [name] is a 51 y.o. female  Chief Complaint  Patient presents with  . Foot Pain    bilateral foot pain (sides)    HPI Patient is having pain in both fees; she would like a referral Pain started to really hurt in the left foot much worse than the right foot about a week ago No unusual activity, no long walks Pain is worse on the medial heel Swelling around the left ankle Nothing similar in the past Was started on gabapentin recently and that helped the leg pain, but feet are really sore Pain is worse with standing; burning Has not tried anything other than propping the feet up; that gives some relief  Left ear been bothering her since yesterday; no trouble hearing; no fevers sinuses are draining  Depression screen [facility] 2/9 04/11/2017 03/21/2017 02/12/2017 02/02/2017 01/09/2017  Decreased Interest 0 0 0 0 0  Down, Depressed, Hopeless 0 0 0 0 0  PHQ - 2 Score 0 0 0 0 0  Some recent data might be hidden    Relevant past medical, surgical, family and social history reviewed Past Medical History:  Diagnosis Date  . Asthma   . Bipolar depression ([place])   . CHF (congestive heart failure) ([place])   . Chronic headaches   . COPD (chronic obstructive pulmonary disease) ([place])   . Diabetic neuropathy ([place])   . GERD (gastroesophageal reflux disease)   . Headache   . Heart attack ([place])   . Hyperlipidemia   . Hypertension   . Hypomagnesemia   . Hypothyroid   . Irregular heart rhythm   . Liver lesion    Favored to be benign on CT; MRI of abd w/contract in Aug 2016. Liver Biopsy-Negative, March 2016  . Morbid obesity ([place])   . OSA (obstructive sleep apnea)   . Pinched nerve    Back  . RLS (restless legs syndrome)   . Seasonal allergies   . Sleep  apnea   . Type 2 diabetes, uncontrolled, with neuropathy ([place]) 10/15/2015   Overview:  Microalbumin 7.7 12/2010: Hgb A1c 10.2% 12/2010 and 9.7% 03/2011, eye exam 10/2009 [facility]   . Urinary incontinence   . Vitamin D deficiency    Past Surgical History:  Procedure Laterality Date  . ABDOMINAL HYSTERECTOMY    . ABDOMINAL HYSTERECTOMY     Partial  . BLADDER SURGERY     x 2  . CHOLECYSTECTOMY    . LIVER BIOPSY  March 2016   Negative  . LUNG BIOPSY    . TUBAL LIGATION     Family History  Problem Relation Age of Onset  . Diabetes Mother   . Heart disease Mother   . Hyperlipidemia Mother   . Hypertension Mother   . Kidney disease Mother   . Mental illness Mother   . Hypothyroidism Mother   . Stroke Mother   . Osteoporosis Mother   . Glaucoma Mother   . Congestive Heart Failure Mother   . Hypertension Father   . Asthma Daughter   . Cancer Daughter        throat  . Bipolar disorder  Daughter   . Heart disease Maternal Grandfather   . Rheum arthritis Maternal Grandfather   . Cancer Maternal Grandfather        liver  . Kidney disease Maternal Grandfather   . Cancer Paternal Grandmother        lung  . Kidney disease Brother   . Bipolar disorder Daughter   . Hypertension Daughter   . Restless legs syndrome Daughter    Social History   Social History  . Marital status: Married    Spouse name: N/A  . Number of children: 2  . Years of education: 12   Occupational History  . disabled    Social History Main Topics  . Smoking status: Never Smoker  . Smokeless tobacco: Never Used  . Alcohol use No  . Drug use: No  . Sexual activity: Yes   Other Topics Concern  . Not on file   Social History Narrative   ** Merged History Encounter **       16-20 oz of caffeine a day     Interim medical history since last visit reviewed. Allergies and medications reviewed  Review of Systems Per HPI unless specifically indicated above     Objective:    BP 116/68   Pulse  98   Temp 98.2 F (36.8 C) (Oral)   Resp 16   Wt 198 lb 9.6 oz (90.1 kg)   SpO2 95%   BMI 40.11 kg/m   Wt Readings from Last 3 Encounters:  04/11/17 198 lb 9.6 oz (90.1 kg)  03/21/17 198 lb 14.4 oz (90.2 kg)  02/26/17 199 lb 3.2 oz (90.4 kg)    Physical Exam  Constitutional: She appears well-developed and well-nourished. No distress.  HENT:  Right Ear: Tympanic membrane and ear canal normal. Tympanic membrane is not erythematous. No middle ear effusion.  Left Ear: Ear canal normal. Tympanic membrane is not erythematous.  Nose: Rhinorrhea present. No mucosal edema.  Mouth/Throat: Oropharynx is clear and moist and mucous membranes are normal.  Cardiovascular: Normal rate.   Pulmonary/Chest: Effort normal.   Diabetic Foot Form - Detailed   Diabetic Foot Exam - detailed Diabetic Foot exam was performed with the following findings:  Yes 04/11/2017  9:32 AM  Visual Foot Exam completed.:  Yes  Normal Range of Motion:  No Pulse Foot Exam completed.:  Yes  Right Dorsalis Pedis:  Present Left Dorsalis Pedis:  Present  Sensory Foot Exam Completed.:  Yes Semmes-Weinstein Monofilament Test R Site 1-Great Toe:  Pos L Site 1-Great Toe:  Pos    Comments:  Mild erythema along the LEFT instep; tenderness along the medial left foot distal to medial malleolus; limited ROM of the left ankle       Assessment & Plan:   Problem List Items Addressed This Visit    None    Visit Diagnoses    Foot pain, left    -  Primary   refer to podiastrist   Relevant Orders   Ambulatory referral to Podiatry   Eustachian tube dysfunction, left       afrin for 3 days and 3 days only; spray, lay, roll       Follow up plan: No Follow-up on file.  An after-visit summary was printed and given to the patient at [facility].  Please see the patient instructions which may contain other information and recommendations beyond what is mentioned above in the assessment and plan.  Meds ordered this encounter    Medications  . gabapentin (  NEURONTIN) 300 MG capsule    Sig: Take 1 capsule (300 mg total) by mouth 2 (two) times daily.    Dispense:  60 capsule    Refill:  5    Orders Placed This Encounter  Procedures  . Ambulatory referral to Podiatry

## 2017-04-13 ENCOUNTER — Other Ambulatory Visit: Payer: Self-pay | Admitting: Family Medicine

## 2017-04-13 DIAGNOSIS — E781 Pure hyperglyceridemia: Secondary | ICD-10-CM

## 2017-04-13 MED ORDER — ATORVASTATIN CALCIUM 40 MG PO TABS: 40.0000 mg | ORAL_TABLET | Freq: Every day | ORAL | 1 refills | Status: DC

## 2017-04-13 NOTE — Progress Notes (Signed)
90 day supply of statin requested by [facility]; sent

## 2017-04-16 ENCOUNTER — Encounter: Payer: Medicare HMO | Attending: Endocrinology

## 2017-04-16 DIAGNOSIS — Z713 Dietary counseling and surveillance: Secondary | ICD-10-CM | POA: Insufficient documentation

## 2017-04-16 DIAGNOSIS — E119 Type 2 diabetes mellitus without complications: Secondary | ICD-10-CM | POA: Insufficient documentation

## 2017-04-16 DIAGNOSIS — E669 Obesity, unspecified: Secondary | ICD-10-CM | POA: Insufficient documentation

## 2017-04-18 ENCOUNTER — Telehealth: Payer: Self-pay | Admitting: Dietician

## 2017-04-18 ENCOUNTER — Encounter: Payer: Self-pay | Admitting: *Deleted

## 2017-04-18 ENCOUNTER — Emergency Department
Admission: EM | Admit: 2017-04-18 | Discharge: 2017-04-18 | Disposition: A | Discharge status: Home / Self Care | Payer: Medicare HMO | Attending: Emergency Medicine | Admitting: Emergency Medicine

## 2017-04-18 DIAGNOSIS — J0100 Acute maxillary sinusitis, unspecified: Secondary | ICD-10-CM | POA: Insufficient documentation

## 2017-04-18 DIAGNOSIS — I110 Hypertensive heart disease with heart failure: Secondary | ICD-10-CM | POA: Insufficient documentation

## 2017-04-18 DIAGNOSIS — Z794 Long term (current) use of insulin: Secondary | ICD-10-CM | POA: Insufficient documentation

## 2017-04-18 DIAGNOSIS — J45909 Unspecified asthma, uncomplicated: Secondary | ICD-10-CM | POA: Diagnosis not present

## 2017-04-18 DIAGNOSIS — E1165 Type 2 diabetes mellitus with hyperglycemia: Secondary | ICD-10-CM | POA: Diagnosis not present

## 2017-04-18 DIAGNOSIS — R5383 Other fatigue: Secondary | ICD-10-CM | POA: Diagnosis not present

## 2017-04-18 DIAGNOSIS — J449 Chronic obstructive pulmonary disease, unspecified: Secondary | ICD-10-CM | POA: Diagnosis not present

## 2017-04-18 DIAGNOSIS — Z79899 Other long term (current) drug therapy: Secondary | ICD-10-CM | POA: Insufficient documentation

## 2017-04-18 DIAGNOSIS — E039 Hypothyroidism, unspecified: Secondary | ICD-10-CM | POA: Insufficient documentation

## 2017-04-18 DIAGNOSIS — I509 Heart failure, unspecified: Secondary | ICD-10-CM | POA: Insufficient documentation

## 2017-04-18 DIAGNOSIS — R52 Pain, unspecified: Secondary | ICD-10-CM | POA: Diagnosis present

## 2017-04-18 LAB — BASIC METABOLIC PANEL
Anion gap: 20 — ABNORMAL HIGH (ref 5–15)
Anion gap: 16 — ABNORMAL HIGH (ref 5–15)
BUN: 36 mg/dL — ABNORMAL HIGH (ref 6–20)
BUN: 33 mg/dL — AB (ref 6–20)
CHLORIDE: 99 mmol/L — AB (ref 101–111)
CHLORIDE: 103 mmol/L (ref 101–111)
CO2: 19 mmol/L — ABNORMAL LOW (ref 22–32)
CO2: 19 mmol/L — ABNORMAL LOW (ref 22–32)
CREATININE: 1.58 mg/dL — AB (ref 0.44–1.00)
Calcium: 9.4 mg/dL (ref 8.9–10.3)
Calcium: 8.5 mg/dL — ABNORMAL LOW (ref 8.9–10.3)
Creatinine, Ser: 1.45 mg/dL — ABNORMAL HIGH (ref 0.44–1.00)
GFR calc Af Amer: 47 mL/min — ABNORMAL LOW (ref >60)
GFR calc non Af Amer: 40 mL/min — ABNORMAL LOW (ref >60)
GFR, EST AFRICAN AMERICAN: 42 mL/min — AB (ref >60)
GFR, EST NON AFRICAN AMERICAN: 36 mL/min — AB (ref >60)
GLUCOSE: 139 mg/dL — AB (ref 65–99)
Glucose, Bld: 139 mg/dL — ABNORMAL HIGH (ref 65–99)
POTASSIUM: 3.5 mmol/L (ref 3.5–5.1)
POTASSIUM: 3.6 mmol/L (ref 3.5–5.1)
SODIUM: 138 mmol/L (ref 135–145)
Sodium: 138 mmol/L (ref 135–145)

## 2017-04-18 LAB — CBC
HCT: 42.2 % (ref 35.0–47.0)
HEMOGLOBIN: 14.5 g/dL (ref 12.0–16.0)
MCH: 29.8 pg (ref 26.0–34.0)
MCHC: 34.4 g/dL (ref 32.0–36.0)
MCV: 86.6 fL (ref 80.0–100.0)
PLATELETS: 325 10*3/uL (ref 150–440)
RBC: 4.88 MIL/uL (ref 3.80–5.20)
RDW: 14.1 % (ref 11.5–14.5)
WBC: 13.6 10*3/uL — ABNORMAL HIGH (ref 3.6–11.0)

## 2017-04-18 LAB — GLUCOSE, CAPILLARY: GLUCOSE-CAPILLARY: 144 mg/dL — AB (ref 65–99)

## 2017-04-18 LAB — URINALYSIS, COMPLETE (UACMP) WITH MICROSCOPIC
Bacteria, UA: NONE SEEN
HGB URINE DIPSTICK: NEGATIVE
Ketones, ur: 20 mg/dL — AB
Leukocytes, UA: NEGATIVE
NITRITE: NEGATIVE
PH: 5 (ref 5.0–8.0)
Protein, ur: 30 mg/dL — AB
Specific Gravity, Urine: 1.031 — ABNORMAL HIGH (ref 1.005–1.030)

## 2017-04-18 LAB — BLOOD GAS, VENOUS
PCO2 VEN: 40 mmHg — AB (ref 44.0–60.0)
PH VEN: 7.29 (ref 7.250–7.430)
PO2 VEN: 48 mmHg — AB (ref 32.0–45.0)
Patient temperature: 37

## 2017-04-18 MED ORDER — AMOXICILLIN-POT CLAVULANATE 875-125 MG PO TABS: 1.0000 | ORAL_TABLET | Freq: Two times a day (BID) | ORAL | 0 refills | Status: AC

## 2017-04-18 MED ORDER — SODIUM CHLORIDE 0.9 % IV BOLUS (SEPSIS)
1000.0000 mL | Freq: Once | INTRAVENOUS | Status: AC
  Administered 2017-04-18: 1000 mL via INTRAVENOUS

## 2017-04-18 MED ORDER — NAPROXEN 500 MG PO TABS
500.0000 mg | ORAL_TABLET | Freq: Once | ORAL | Status: AC
  Administered 2017-04-18: 500 mg via ORAL
  Filled 2017-04-18: qty 1

## 2017-04-18 MED ORDER — ONDANSETRON 4 MG PO TBDP
4.0000 mg | ORAL_TABLET | Freq: Once | ORAL | Status: AC
  Administered 2017-04-18: 4 mg via ORAL
  Filled 2017-04-18: qty 1

## 2017-04-18 NOTE — ED Triage Notes (Signed)
PT to ED via EMS reporting congestion with yellow nasal and throat drainage. Pt reports having been to PCP yesterday and was told she had fluid behind her ears but reports "the doctor didn't give me anything for it." Pt also reports having nausea and intermittent dry heaves, no fevers. Pt reports having been feeling tired over the past 2 days as well and reports increased amount of sleeping.   Pt also verbalized left foot pain with hx of gout. Pt able to ambulate on foot and move foot. No color change or swelling. No trauma reported. Sensation intact.

## 2017-04-18 NOTE — ED Notes (Signed)
Patient states she vomited 4 times at home today and then had dry heaves. Patient denies vomiting since arrival to hospital. PA aware. Patient states she feels better after receiving some of the fluid.

## 2017-04-18 NOTE — ED Provider Notes (Signed)
[facility] Emergency Department Provider Note  ____________________________________________  Time seen: Approximately 1:25 PM  I have reviewed the triage vital signs and the nursing notes.   HISTORY  Chief Complaint Generalized Body Aches    HPI [name] is a 51 y.o. female that presents to emergency department for 2 days of nasal congestion and fatigue. The congestion is making her nauseous. She has not been able to eat for the last two days because she does not feel well. She states that the inside of her foot has also been hurting for the last week. She was told that she has gout but also does not have any medication for it. No injury. She has been referred to podiatry. She has a nurse at home who told her to come to the emergency department for dehydration. She saw her primary care doctor yesterday who told her that she had fluid behind her ears but did not give her any medication for it. She uses jiardiance and insulin for her diabetes. No fever, cough, shortness of breath, chest pain, vomiting, abdominal pain.   Past Medical History:  Diagnosis Date  . Asthma   . Bipolar depression ([place])   . CHF (congestive heart failure) ([place])   . Chronic headaches   . COPD (chronic obstructive pulmonary disease) ([place])   . Diabetic neuropathy ([place])   . GERD (gastroesophageal reflux disease)   . Headache   . Heart attack ([place])   . Hyperlipidemia   . Hypertension   . Hypomagnesemia   . Hypothyroid   . Irregular heart rhythm   . Liver lesion    Favored to be benign on CT; MRI of abd w/contract in Aug 2016. Liver Biopsy-Negative, March 2016  . Morbid obesity ([place])   . OSA (obstructive sleep apnea)   . Pinched nerve    Back  . RLS (restless legs syndrome)   . Seasonal allergies   . Sleep apnea   . Type 2 diabetes, uncontrolled, with neuropathy ([place]) 10/15/2015   Overview:  Microalbumin 7.7 12/2010: Hgb A1c 10.2% 12/2010 and 9.7% 03/2011, eye exam 10/2009 [facility] DM  clinic   . Urinary incontinence   . Vitamin D deficiency     Patient Active Problem List   Diagnosis Date Noted  . Hypertriglyceridemia 12/21/2016  . Pansinusitis 11/15/2016  . 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 Breast cancer screening 09/28/2016  . Urine frequency 09/28/2016  . Preventative health care 09/28/2016  . Fatigue 05/01/2016  . Urinary tract infection 03/14/2016  . Adenomatous colon polyp 10/15/2015  . Airway hyperreactivity 10/15/2015  . Bipolar affective disorder ([place]) 10/15/2015  . CN (constipation) 10/15/2015  . CAFL (chronic airflow limitation) ([place]) 10/15/2015  . Drug-induced hepatic toxicity 10/15/2015  . Blood in feces 10/15/2015  . HLD (hyperlipidemia) 10/15/2015  . Hypoxemia 10/15/2015  . NASH (nonalcoholic steatohepatitis) 10/15/2015  . Morbid obesity ([place]) 10/15/2015  . Restless leg syndrome 10/15/2015  . Type 2 diabetes, uncontrolled, with neuropathy ([place]) 10/15/2015  . Elevated alkaline phosphatase level 09/22/2015  . Urinary bladder incontinence 09/22/2015  . Low back pain with left-sided sciatica 08/18/2015  . Encounter for monitoring tricyclic antidepressant therapy 05/13/2015  . Bipolar disorder ([place]) 05/11/2015  . Liver disease 05/11/2015  . Diabetes ([place]) 05/11/2015  . Apnea, sleep 05/05/2015  . Heart valve disease 05/05/2015  . Chronic kidney disease, stage III (moderate) 03/15/2015  . Migraine 03/12/2015  . COPD (chronic obstructive pulmonary disease) ([place]) 01/20/2015  . Selective deficiency  of IgG ([place]) 01/20/2015  . GERD (gastroesophageal reflux disease) 01/20/2015  . Hypothyroid   . Vitamin D deficiency   . Urinary incontinence   . Liver lesion   . Hypomagnesemia   . Obstructive sleep apnea 10/20/2013  . PERIPHERAL NEUROPATHY 03/04/2009  . HEMORRHOIDS, EXTERNAL 09/03/2008  . ALLERGIC RHINITIS 07/04/2007  . ASTHMA, PERSISTENT, MODERATE 07/04/2007  . INTERSTITIAL CYSTITIS 07/04/2007  . HYPERTENSION, BENIGN  ESSENTIAL 06/19/2007    Past Surgical History:  Procedure Laterality Date  . ABDOMINAL HYSTERECTOMY    . ABDOMINAL HYSTERECTOMY     Partial  . BLADDER SURGERY     x 2  . CHOLECYSTECTOMY    . LIVER BIOPSY  March 2016   Negative  . LUNG BIOPSY    . TUBAL LIGATION      Prior to Admission medications   Medication Sig Start Date End Date Taking? Authorizing Provider  ACCU-CHEK AVIVA PLUS test strip Check FSBS three times a day; LON 99 months, DX E11.40 05/10/16   Lada, Satira Anis, MD  ACCU-CHEK SOFTCLIX LANCETS lancets Check FSBS three times a day; LON 99 months, DX E11.40 05/10/16   Lada, Satira Anis, MD  albuterol (PROVENTIL HFA;VENTOLIN HFA) 108 (90 Base) MCG/ACT inhaler Inhale 2 puffs into the lungs every 4 (four) hours as needed for wheezing or shortness of breath. 11/19/15   Arnetha Courser, MD  amLODipine (NORVASC) 2.5 MG tablet Take 1 tablet (2.5 mg total) by mouth daily. 12/28/16   Arnetha Courser, MD  amoxicillin-clavulanate (AUGMENTIN) 875-125 MG tablet Take 1 tablet by mouth 2 (two) times daily. 04/18/17 04/28/17  Laban Emperor, PA-C  aspirin EC 81 MG tablet Take 1 tablet (81 mg total) by mouth daily. 11/03/16   Arnetha Courser, MD  atorvastatin (LIPITOR) 40 MG tablet Take 1 tablet (40 mg total) by mouth at bedtime. 04/13/17   Arnetha Courser, MD  budesonide-formoterol (SYMBICORT) 160-4.5 MCG/ACT inhaler Inhale 2 puffs into the lungs as needed.     [provider]  clobetasol cream (TEMOVATE) 5.03 % Apply 1 application topically 2 (two) times daily. If needed; too strong for face, underarms, groin, children 02/02/17   Lada, Satira Anis, MD  DULoxetine (CYMBALTA) 60 MG capsule Take 120 mg by mouth daily.    [provider]  ezetimibe (ZETIA) 10 MG tablet Take 10 mg by mouth daily.  01/05/17   [provider]  fluticasone (FLONASE) 50 MCG/ACT nasal spray Place 2 sprays into both nostrils daily. 01/09/17 01/09/18  Arnetha Courser, MD  gabapentin (NEURONTIN) 300 MG capsule Take  1 capsule (300 mg total) by mouth 2 (two) times daily. 04/11/17   Arnetha Courser, MD  HUMULIN R U-500 KWIKPEN 500 UNIT/ML kwikpen 60 Units 3 (three) times daily with meals.  12/06/16   [provider]  hydrOXYzine (ATARAX/VISTARIL) 50 MG tablet 50 mg daily. Take 0.5 tablet in the morning and 0.5 tablet  at night. 12/19/16   [provider]  Icosapent Ethyl (VASCEPA) 1 g CAPS Two pills by mouth twice a day for cholesterol (this replaces Lovaza) 09/29/16   Lada, Satira Anis, MD  insulin aspart (NOVOLOG) 100 UNIT/ML injection Short-acting insulin TID ac as follows: 70-150=30units 151-200=35units 201-250=40units 251-300=45units 301-350=50units 351-400=55units 401-450=60units 09/29/16   Lada, Satira Anis, MD  Insulin Syringe-Needle U-100 (ULTICARE INSULIN SYRINGE) 31G X 1/4" 0.3 ML MISC Inject 1 each into the skin 5 (five) times daily as needed. 09/22/16   Arnetha Courser, MD  JARDIANCE 25 MG TABS tablet  Take 25 mg by mouth daily. 02/26/17   [provider]  lamoTRIgine (LAMICTAL) 150 MG tablet Take 150 mg by mouth daily.  03/22/15   [provider]  levothyroxine (SYNTHROID, LEVOTHROID) 75 MCG tablet Take 1 tablet (75 mcg total) by mouth daily. 10/19/16   Arnetha Courser, MD  linaclotide Rolan Lipa) 72 MCG capsule Take 1 capsule (72 mcg total) by mouth daily before breakfast. 08/21/16   [name], MD  magnesium oxide (MAG-OX) 400 MG tablet Take 1 tablet (400 mg total) by mouth 3 (three) times daily. 11/15/16   Arnetha Courser, MD  metoprolol tartrate (LOPRESSOR) 25 MG tablet Take 0.5 tablets (12.5 mg total) by mouth 2 (two) times daily. 05/17/16   Arnetha Courser, MD  Multiple Vitamin (MULTIVITAMIN) capsule Take by mouth daily.     [provider]  ondansetron (ZOFRAN ODT) 4 MG disintegrating tablet Take 1 tablet (4 mg total) by mouth every 8 (eight) hours as needed for nausea or vomiting. 10/28/16   Earleen Newport, MD  pantoprazole (PROTONIX) 40 MG tablet Take 1  tablet (40 mg total) by mouth daily. 08/21/16   [name], MD  QUEtiapine (SEROQUEL) 25 MG tablet Take 25 mg by mouth every morning. Take one in am, one at lunch    [provider]  QUEtiapine (SEROQUEL) 400 MG tablet Take 800 mg by mouth at bedtime. Take 2 tabs at bedtime    [provider]  ranitidine (ZANTAC) 300 MG tablet Take 1 tablet (300 mg total) by mouth at bedtime. 05/15/16   Lada, Satira Anis, MD  zolpidem (AMBIEN) 10 MG tablet Take 10 mg by mouth at bedtime.  07/26/15   [provider]    Allergies Morphine and related; Codeine; Codeine; Hydrocodone-acetaminophen; Mirtazapine; Morphine and related; Sulfamethoxazole-trimethoprim; and Vicodin [hydrocodone-acetaminophen]  Family History  Problem Relation Age of Onset  . Diabetes Mother   . Heart disease Mother   . Hyperlipidemia Mother   . Hypertension Mother   . Kidney disease Mother   . Mental illness Mother   . Hypothyroidism Mother   . Stroke Mother   . Osteoporosis Mother   . Glaucoma Mother   . Congestive Heart Failure Mother   . Hypertension Father   . Asthma Daughter   . Cancer Daughter        throat  . Bipolar disorder Daughter   . Heart disease Maternal Grandfather   . Rheum arthritis Maternal Grandfather   . Cancer Maternal Grandfather        liver  . Kidney disease Maternal Grandfather   . Cancer Paternal Grandmother        lung  . Kidney disease Brother   . Bipolar disorder Daughter   . Hypertension Daughter   . Restless legs syndrome Daughter     Social History Social History  Substance Use Topics  . Smoking status: Never Smoker  . Smokeless tobacco: Never Used  . Alcohol use No     Review of Systems  Constitutional: No fever/chills Cardiovascular: No chest pain. Respiratory: No cough. No SOB. Gastrointestinal: No abdominal pain.  No vomiting.  Musculoskeletal: Negative for musculoskeletal pain. Skin: Negative for rash, abrasions, lacerations,  ecchymosis. Neurological: Negative for headaches, numbness or tingling   ____________________________________________   PHYSICAL EXAM:  VITAL SIGNS: ED Triage Vitals  Enc Vitals Group     BP 04/18/17 1205 114/60     Pulse Rate 04/18/17 1205 99     Resp 04/18/17 1205 16  Temp 04/18/17 1205 98.3 F (36.8 C)     Temp Source 04/18/17 1205 Oral     SpO2 04/18/17 1205 94 %     Weight 04/18/17 1202 200 lb (90.7 kg)     Height 04/18/17 1202 4' 11"  (1.499 m)     Head Circumference --      Peak Flow --      Pain Score 04/18/17 1200 8     Pain Loc --      Pain Edu? --      Excl. in [place]? --      Constitutional: Alert and oriented. Well appearing and in no acute distress. Eyes: Conjunctivae are normal. PERRL. EOMI. Head: Atraumatic. ENT: Maxillary sinus tenderness to palpation      Ears: Tympanic membranes pearly gray.      Nose: No congestion/rhinnorhea.      Mouth/Throat: Mucous membranes are moist. Oropharynx non-erythematous. Neck: No stridor.  Cardiovascular: Normal rate, regular rhythm.  Good peripheral circulation. Respiratory: Normal respiratory effort without tachypnea or retractions. Lungs CTAB. Good air entry to the bases with no decreased or absent breath sounds. Gastrointestinal: Bowel sounds 4 quadrants. Soft and nontender to palpation. No guarding or rigidity. No palpable masses. No distention.  Musculoskeletal: Full range of motion to all extremities. No gross deformities appreciated. Mild tenderness to palpation over medial side of left foot. No erythema or swelling.  Neurologic:  Normal speech and language. No gross focal neurologic deficits are appreciated.  Skin:  Skin is warm, dry and intact. No rash noted.    ____________________________________________   LABS (all labs ordered are listed, but only abnormal results are displayed)  Labs Reviewed  GLUCOSE, CAPILLARY - Abnormal; Notable for the following:       Result Value   Glucose-Capillary 144 (*)     All other components within normal limits  CBC - Abnormal; Notable for the following:    WBC 13.6 (*)    All other components within normal limits  BASIC METABOLIC PANEL - Abnormal; Notable for the following:    Chloride 99 (*)    CO2 19 (*)    Glucose, Bld 139 (*)    BUN 36 (*)    Creatinine, Ser 1.58 (*)    GFR calc non Af Amer 36 (*)    GFR calc Af Amer 42 (*)    Anion gap 20 (*)    All other components within normal limits  URINALYSIS, COMPLETE (UACMP) WITH MICROSCOPIC - Abnormal; Notable for the following:    Color, Urine YELLOW (*)    APPearance CLEAR (*)    Specific Gravity, Urine 1.031 (*)    Glucose, UA >=500 (*)    Bilirubin Urine SMALL (*)    Ketones, ur 20 (*)    Protein, ur 30 (*)    Squamous Epithelial / LPF 0-5 (*)    All other components within normal limits  BLOOD GAS, VENOUS - Abnormal; Notable for the following:    pCO2, Ven 40 (*)    pO2, Ven 48.0 (*)    All other components within normal limits  BASIC METABOLIC PANEL - Abnormal; Notable for the following:    CO2 19 (*)    Glucose, Bld 139 (*)    BUN 33 (*)    Creatinine, Ser 1.45 (*)    Calcium 8.5 (*)    GFR calc non Af Amer 40 (*)    GFR calc Af Amer 47 (*)    Anion gap 16 (*)  All other components within normal limits   ____________________________________________  EKG   ____________________________________________  RADIOLOGY  No results found.  ____________________________________________    PROCEDURES  Procedure(s) performed:    Procedures    Medications  sodium chloride 0.9 % bolus 1,000 mL (0 mLs Intravenous Stopped 04/18/17 1736)  ondansetron (ZOFRAN-ODT) disintegrating tablet 4 mg (4 mg Oral Given 04/18/17 1355)  naproxen (NAPROSYN) tablet 500 mg (500 mg Oral Given 04/18/17 1504)    ____________________________________________   INITIAL IMPRESSION / ASSESSMENT AND PLAN / ED COURSE  Pertinent labs & imaging results that were available during my care of the  patient were reviewed by me and considered in my medical decision making (see chart for details).  Review of the [place] CSRS was performed in accordance of the [place] prior to dispensing any controlled drugs.  Patient's diagnosis is consistent with sinusitis. Vital signs and exam are reassuring. RN was concerned that patient smiled "fruity" and could be in DKA so lab work was ordered.   BUN and creatinine were elevated with an anion gap of 20, which was then redrawn after a liter of fluids. Anion gap decreased to 16. Patient states that she felt a lot better after fluids. Patient was given naproxen before blood work for foot pain. No indication of gout. She already has a podiatry referral for this. Dr. Archie Balboa was consulted and was comfortable sending patient home. Patient has a home Programmer, applications. Patient will be discharged home with prescriptions for Augmentin. Patient is to follow up with PCP  as directed. Patient is given ED precautions to return to the ED for any worsening or new symptoms.   ____________________________________________  FINAL CLINICAL IMPRESSION(S) / ED DIAGNOSES  Final diagnoses:  Acute non-recurrent maxillary sinusitis  Fatigue, unspecified type      NEW MEDICATIONS STARTED DURING THIS VISIT:  Discharge Medication List as of 04/18/2017  6:34 PM    START taking these medications   Details  amoxicillin-clavulanate (AUGMENTIN) 875-125 MG tablet Take 1 tablet by mouth 2 (two) times daily., Starting Wed 04/18/2017, Until Sat 04/28/2017, Print            This chart was dictated using voice recognition software/Dragon. Despite best efforts to proofread, errors can occur which can change the meaning. Any change was purely unintentional.    Laban Emperor, PA-C 04/19/17 1110    Earleen Newport, MD 04/19/17 [phone]

## 2017-04-18 NOTE — ED Notes (Signed)
See triage note  States she has had nasal congestion with some drainage into throat for a few days   No fever or n/v  But has felt tired over the past 2 days

## 2017-04-18 NOTE — Telephone Encounter (Signed)
Called patient to reschedule classes 2 and 3, as she has missed both classes due to illness. Spoke with another person, who stated she will want to reschedule, but is currently being taken to the hospital.

## 2017-04-20 ENCOUNTER — Other Ambulatory Visit: Payer: Self-pay

## 2017-04-20 MED ORDER — METOPROLOL TARTRATE 25 MG PO TABS: 12.5000 mg | ORAL_TABLET | Freq: Two times a day (BID) | ORAL | 11 refills | Status: DC

## 2017-04-24 ENCOUNTER — Telehealth: Payer: Self-pay | Admitting: Dietician

## 2017-04-24 NOTE — Telephone Encounter (Signed)
Called pt-pt reports she missed class 3 last wk due to death in family. Has another family member in hospital now.  Pt has UTI and saw MD for treatment recently.  Pt will call later to reschedule class 2 and 3.

## 2017-05-03 ENCOUNTER — Telehealth: Payer: Self-pay | Admitting: Family Medicine

## 2017-05-03 NOTE — Telephone Encounter (Signed)
Please follow-up with patient about the outstanding mammogram order We strongly encourage breast cancer screening Breast cancer is the 2nd leading cause of death in American women from cancer (#1 in Hispanic women in this country), and early detection saves lives Thank you

## 2017-05-08 NOTE — Telephone Encounter (Signed)
Pt husband has been notified. Pt has her husband on DPR.

## 2017-05-10 ENCOUNTER — Other Ambulatory Visit: Payer: Self-pay | Admitting: Podiatry

## 2017-05-10 ENCOUNTER — Ambulatory Visit (INDEPENDENT_AMBULATORY_CARE_PROVIDER_SITE_OTHER): Payer: Medicare HMO

## 2017-05-10 ENCOUNTER — Encounter: Payer: Self-pay | Admitting: Podiatry

## 2017-05-10 ENCOUNTER — Ambulatory Visit (INDEPENDENT_AMBULATORY_CARE_PROVIDER_SITE_OTHER): Payer: Medicare HMO | Admitting: Podiatry

## 2017-05-10 ENCOUNTER — Ambulatory Visit: Payer: Self-pay | Admitting: Podiatry

## 2017-05-10 DIAGNOSIS — M722 Plantar fascial fibromatosis: Secondary | ICD-10-CM

## 2017-05-10 DIAGNOSIS — M2140 Flat foot [pes planus] (acquired), unspecified foot: Secondary | ICD-10-CM

## 2017-05-10 DIAGNOSIS — M76829 Posterior tibial tendinitis, unspecified leg: Secondary | ICD-10-CM

## 2017-05-10 DIAGNOSIS — R52 Pain, unspecified: Secondary | ICD-10-CM | POA: Diagnosis not present

## 2017-05-10 DIAGNOSIS — E1142 Type 2 diabetes mellitus with diabetic polyneuropathy: Secondary | ICD-10-CM

## 2017-05-11 NOTE — Progress Notes (Signed)
   Subjective:    Patient ID: [name], female    DOB: 07/26/1966, 51 y.o.   MRN: [id number]  HPI this patient presents the office with chief complaint of mild pain in her right foot and severe pain in her left foot.  She says the pain that she has been experiencing has been present for approximately 4 weeks.  She says that her right foot has actually been improving since its initial pain.  She presents the office today and says she has extreme pain all through her left foot as she walks.  She was initially seen by [name]  on August 8.  There was no treatment afforded. At that time except for a referral to this office.  Patient denies any history of trauma or injury to the foot.  Self treatment has been afforded.  She presents the office pointing to her left ankle as the site of her pain.  This patient is diabetic with neuropathy and has been taking gabapentin for her neuropathy.    Review of Systems  All other systems reviewed and are negative.      Objective:   Physical Exam GENERAL APPEARANCE: Alert, conversant. Appropriately groomed. No acute distress.  VASCULAR: Pedal pulses are  palpable at  [facility] and PT bilateral.  Capillary refill time is immediate to all digits,  Normal temperature gradient.    NEUROLOGIC: sensation is diminished to 5.07 monofilament at 5/5 sites bilateral.  Light touch is diminished bilateral, Muscle strength normal.  MUSCULOSKELETAL: acceptable muscle strength, tone and stability bilateral.  Intrinsic muscluature intact bilateral.  Rectus appearance of foot and digits noted bilateral. Palpable pain is noted along the course of the posterior tibial tendon left ankle.  Palpable pain along the course of the peroneal tendon left ankle.  Pain noted through the arch of the plantar fascia of the left foot.  Mild swelling noted to the left foot in general.  Patient does have normal range of motion through her foot.    DERMATOLOGIC: skin color, texture, and turgor are within  normal limits.  No preulcerative lesions or ulcers  are seen, no interdigital maceration noted.  No open lesions present.  Digital nails are asymptomatic. No drainage noted.         Assessment & Plan:  Ankle tendinitis  Left  Plantar fascitis left foot.  IE  After evaluation and discussion with the patient. It was determined that there was tendinitis to her left ankle and plantar fasciitis, left foot.  Radiographic x-rays were taken with no evidence of any bony pathology.  This patient states that the pain is very severe and we discussed conservative treatment versus immobilization.  She opted for immobilization and therefore an Haematologist with a Darco shoe was dispensed.  Patient was told to return to the office in one week at which time we'll remove the Unna boot give her an anklet and tell her to ambulate slowly with the shoe.  RTC 1 week.   Gardiner Barefoot DPM

## 2017-05-14 ENCOUNTER — Encounter: Payer: Self-pay | Admitting: Dietician

## 2017-05-14 NOTE — Progress Notes (Signed)
Have not heard from pt-will discharge pt

## 2017-05-17 ENCOUNTER — Ambulatory Visit (INDEPENDENT_AMBULATORY_CARE_PROVIDER_SITE_OTHER): Payer: Medicare HMO | Admitting: Podiatry

## 2017-05-17 ENCOUNTER — Encounter: Payer: Self-pay | Admitting: Podiatry

## 2017-05-17 DIAGNOSIS — M2140 Flat foot [pes planus] (acquired), unspecified foot: Secondary | ICD-10-CM | POA: Diagnosis not present

## 2017-05-17 DIAGNOSIS — M76829 Posterior tibial tendinitis, unspecified leg: Secondary | ICD-10-CM

## 2017-05-17 DIAGNOSIS — M722 Plantar fascial fibromatosis: Secondary | ICD-10-CM

## 2017-05-17 NOTE — Progress Notes (Signed)
This patient presents to the office for an evaluation of her pain in her left foot.  She was diagnosed with ankle tendinitis and plantar fasciitis on her left foot on 05/20/2017.  She was treated with an Unna boot to help immobilize her left foot and ankle.  She has worn her Unna boot as well as her Darco shoe for one week and presents the office today to remove the [employer].  She says that her foot has been very sore since her last visit even despite wearing the Unna boot and wooden shoe.  She returns to the office for removal of the Unna boot and a reevaluation of her left foot and ankle  Neurovascular status is the same for her vascular .  Patient has pain out of proportion on palpation to the left ankle and the left arch.  No evidence of any redness, swelling or inflammation noted at these sites.  Patient cringes is upon touching her left foot  of the left foot and ankle in the exam room.  Dermatologic status is the same as her visit on 05/10/2017.    Ankle Tendinitis left  Plantar fascitis left foot.  ROV  Unna boot was removed from her left foot and ankle.  Patient exhibited significant pain and discomfort along the tendons of her ankle as well as the plantar aspect of her foot.  There is no evidence of any redness, swelling or inflammation to the foot.  I therefore recommended that she be seen by Dr. Amalia Hailey tomorrow for possible evaluation for reflex sympathetic dystrophy.     Gardiner Barefoot DPM

## 2017-05-18 ENCOUNTER — Telehealth: Payer: Self-pay | Admitting: *Deleted

## 2017-05-18 ENCOUNTER — Ambulatory Visit (INDEPENDENT_AMBULATORY_CARE_PROVIDER_SITE_OTHER): Payer: Medicare HMO | Admitting: Podiatry

## 2017-05-18 DIAGNOSIS — R202 Paresthesia of skin: Secondary | ICD-10-CM

## 2017-05-18 DIAGNOSIS — R52 Pain, unspecified: Secondary | ICD-10-CM

## 2017-05-18 DIAGNOSIS — M79609 Pain in unspecified limb: Secondary | ICD-10-CM

## 2017-05-18 DIAGNOSIS — G5792 Unspecified mononeuropathy of left lower limb: Secondary | ICD-10-CM

## 2017-05-18 MED ORDER — OXYCODONE-ACETAMINOPHEN 5-325 MG PO TABS: 1.0000 | ORAL_TABLET | Freq: Three times a day (TID) | ORAL | 0 refills | Status: DC | PRN

## 2017-05-18 MED ORDER — BETAMETHASONE SOD PHOS & ACET 6 (3-3) MG/ML IJ SUSP: 3.0000 mg | Freq: Once | INTRAMUSCULAR | Status: DC

## 2017-05-18 NOTE — Addendum Note (Signed)
Addended by: Edrick Kins on: 05/18/2017 01:16 PM   Modules accepted: Orders, Level of Service

## 2017-05-18 NOTE — Progress Notes (Signed)
   HPI: 51 year old female presents to the office today as referral from Dr. Gardiner Barefoot, DPM for a second opinion regarding left foot and ankle pain. Patient states that approximately 2 months ago she began to notice severe foot pain to the entire left foot. 7 onset with no recollection of injury or trauma. Patient now has constant aching burning pain throughout the left foot and ankle. Pain is constant whether the patient is walking standing or sitting. She presents today for further treatment and evaluation  Past Medical History:  Diagnosis Date  . Asthma   . Bipolar depression ([place])   . CHF (congestive heart failure) ([place])   . Chronic headaches   . COPD (chronic obstructive pulmonary disease) ([place])   . Diabetic neuropathy ([place])   . GERD (gastroesophageal reflux disease)   . Headache   . Heart attack ([place])   . Hyperlipidemia   . Hypertension   . Hypomagnesemia   . Hypothyroid   . Irregular heart rhythm   . Liver lesion    Favored to be benign on CT; MRI of abd w/contract in Aug 2016. Liver Biopsy-Negative, March 2016  . Morbid obesity ([place])   . OSA (obstructive sleep apnea)   . Pinched nerve    Back  . RLS (restless legs syndrome)   . Seasonal allergies   . Sleep apnea   . Type 2 diabetes, uncontrolled, with neuropathy ([place]) 10/15/2015   Overview:  Microalbumin 7.7 12/2010: Hgb A1c 10.2% 12/2010 and 9.7% 03/2011, eye exam 10/2009 [facility] DM clinic   . Urinary incontinence   . Vitamin D deficiency      Physical Exam: General: The patient is alert and oriented x3 in no acute distress.  Dermatology: Skin is warm, dry and supple bilateral lower extremities. Negative for open lesions or macerations.  Vascular: Palpable pedal pulses bilaterally. No edema or erythema noted. Capillary refill within normal limits.  Neurological: Epicritic and protective threshold grossly intact bilaterally.   Musculoskeletal Exam: Pain on palpation to all areas and aspects of the left foot. There is  also significant pain with light touch as well. Range of motion within normal limits to all pedal and ankle joints bilateral. Muscle strength 5/5 in all groups bilateral.   Assessment: -  Possible CRPS left foot - Diffuse left foot and ankle pain likely neurological in etiology   Plan of Care:  - Patient was evaluated today. - Diagnostic injection along the tibial nerve was performed just posterior to the medial malleolus. Injection of 0.5 mL Celestone Soluspan with 1.5 mL 1% lidocaine utilized - Today regarding to order nerve conduction studies to the left lower extremity -  Prescription for Percocet 5/325 mg #20 - Return to clinic in 4 weeks to review the nerve conduction study results  Edrick Kins, DPM [facility]  Dr. Edrick Kins, DPM    [address], [place] [address]                Office [phone]  Fax [phone]

## 2017-05-18 NOTE — Telephone Encounter (Addendum)
-----   Message from Edrick Kins, DPM sent at 05/18/2017  1:14 PM EDT ----- Regarding: Nerve conduction study Please order nerve conduction study to left lower extremity.   Dx: Possible CRPS left foot and ankle. Pain with light touch left foot.   Thanks, Dr. Amalia Hailey. Orders to [facility] - Neurology.

## 2017-05-24 ENCOUNTER — Telehealth: Payer: Self-pay

## 2017-05-24 NOTE — Telephone Encounter (Signed)
Referral has been faxed to [facility] clinic Neuro dept.  Patient has appt schedule on 06/12/17 @ 9:15am

## 2017-05-24 NOTE — Addendum Note (Signed)
Addended by: Graceann Congress D on: 05/24/2017 03:29 PM   Modules accepted: Orders

## 2017-05-24 NOTE — Telephone Encounter (Signed)
-----   Message from Edrick Kins, DPM sent at 05/18/2017  1:14 PM EDT ----- Regarding: Nerve conduction study Please order nerve conduction study to left lower extremity.   Dx: Possible CRPS left foot and ankle. Pain with light touch left foot.   Thanks, Dr. Amalia Hailey

## 2017-05-24 NOTE — Addendum Note (Signed)
Addended by: Graceann Congress D on: 05/24/2017 03:20 PM   Modules accepted: Orders

## 2017-05-27 ENCOUNTER — Inpatient Hospital Stay
Admission: EM | Admit: 2017-05-27 | Discharge: 2017-05-29 | DRG: 194 | Disposition: A | Discharge status: Home / Self Care | Payer: MEDICARE | Attending: Internal Medicine | Admitting: Internal Medicine

## 2017-05-27 ENCOUNTER — Encounter: Payer: Self-pay | Admitting: Emergency Medicine

## 2017-05-27 ENCOUNTER — Emergency Department: Payer: MEDICARE

## 2017-05-27 DIAGNOSIS — E1141 Type 2 diabetes mellitus with diabetic mononeuropathy: Secondary | ICD-10-CM | POA: Diagnosis present

## 2017-05-27 DIAGNOSIS — Z794 Long term (current) use of insulin: Secondary | ICD-10-CM

## 2017-05-27 DIAGNOSIS — Z7982 Long term (current) use of aspirin: Secondary | ICD-10-CM

## 2017-05-27 DIAGNOSIS — I252 Old myocardial infarction: Secondary | ICD-10-CM

## 2017-05-27 DIAGNOSIS — Z7951 Long term (current) use of inhaled steroids: Secondary | ICD-10-CM

## 2017-05-27 DIAGNOSIS — Z818 Family history of other mental and behavioral disorders: Secondary | ICD-10-CM

## 2017-05-27 DIAGNOSIS — Z6841 Body Mass Index (BMI) 40.0 and over, adult: Secondary | ICD-10-CM

## 2017-05-27 DIAGNOSIS — Z79899 Other long term (current) drug therapy: Secondary | ICD-10-CM

## 2017-05-27 DIAGNOSIS — IMO0002 Reserved for concepts with insufficient information to code with codable children: Secondary | ICD-10-CM | POA: Diagnosis present

## 2017-05-27 DIAGNOSIS — G2581 Restless legs syndrome: Secondary | ICD-10-CM | POA: Diagnosis present

## 2017-05-27 DIAGNOSIS — E1140 Type 2 diabetes mellitus with diabetic neuropathy, unspecified: Secondary | ICD-10-CM | POA: Diagnosis present

## 2017-05-27 DIAGNOSIS — F319 Bipolar disorder, unspecified: Secondary | ICD-10-CM | POA: Diagnosis not present

## 2017-05-27 DIAGNOSIS — Z833 Family history of diabetes mellitus: Secondary | ICD-10-CM

## 2017-05-27 DIAGNOSIS — J189 Pneumonia, unspecified organism: Secondary | ICD-10-CM | POA: Diagnosis not present

## 2017-05-27 DIAGNOSIS — R0781 Pleurodynia: Secondary | ICD-10-CM

## 2017-05-27 DIAGNOSIS — K219 Gastro-esophageal reflux disease without esophagitis: Secondary | ICD-10-CM | POA: Diagnosis present

## 2017-05-27 DIAGNOSIS — Z823 Family history of stroke: Secondary | ICD-10-CM

## 2017-05-27 DIAGNOSIS — E785 Hyperlipidemia, unspecified: Secondary | ICD-10-CM | POA: Diagnosis not present

## 2017-05-27 DIAGNOSIS — I129 Hypertensive chronic kidney disease with stage 1 through stage 4 chronic kidney disease, or unspecified chronic kidney disease: Secondary | ICD-10-CM | POA: Diagnosis present

## 2017-05-27 DIAGNOSIS — E039 Hypothyroidism, unspecified: Secondary | ICD-10-CM | POA: Diagnosis not present

## 2017-05-27 DIAGNOSIS — G4733 Obstructive sleep apnea (adult) (pediatric): Secondary | ICD-10-CM | POA: Diagnosis present

## 2017-05-27 DIAGNOSIS — Z8249 Family history of ischemic heart disease and other diseases of the circulatory system: Secondary | ICD-10-CM

## 2017-05-27 DIAGNOSIS — N183 Chronic kidney disease, stage 3 (moderate): Secondary | ICD-10-CM | POA: Diagnosis present

## 2017-05-27 DIAGNOSIS — R0602 Shortness of breath: Secondary | ICD-10-CM | POA: Diagnosis present

## 2017-05-27 DIAGNOSIS — I2510 Atherosclerotic heart disease of native coronary artery without angina pectoris: Secondary | ICD-10-CM | POA: Diagnosis present

## 2017-05-27 DIAGNOSIS — I10 Essential (primary) hypertension: Secondary | ICD-10-CM | POA: Diagnosis present

## 2017-05-27 DIAGNOSIS — E1165 Type 2 diabetes mellitus with hyperglycemia: Secondary | ICD-10-CM | POA: Diagnosis present

## 2017-05-27 DIAGNOSIS — J440 Chronic obstructive pulmonary disease with acute lower respiratory infection: Secondary | ICD-10-CM | POA: Diagnosis present

## 2017-05-27 DIAGNOSIS — E1122 Type 2 diabetes mellitus with diabetic chronic kidney disease: Secondary | ICD-10-CM | POA: Diagnosis present

## 2017-05-27 LAB — CBC
HCT: 39 % (ref 35.0–47.0)
Hemoglobin: 13.2 g/dL (ref 12.0–16.0)
MCH: 29.4 pg (ref 26.0–34.0)
MCHC: 33.9 g/dL (ref 32.0–36.0)
MCV: 86.7 fL (ref 80.0–100.0)
Platelets: 179 10*3/uL (ref 150–440)
RBC: 4.5 MIL/uL (ref 3.80–5.20)
RDW: 14 % (ref 11.5–14.5)
WBC: 7.1 10*3/uL (ref 3.6–11.0)

## 2017-05-27 LAB — BASIC METABOLIC PANEL
Anion gap: 13 (ref 5–15)
BUN: 9 mg/dL (ref 6–20)
CO2: 23 mmol/L (ref 22–32)
CREATININE: 1.01 mg/dL — AB (ref 0.44–1.00)
Calcium: 8.9 mg/dL (ref 8.9–10.3)
Chloride: 100 mmol/L — ABNORMAL LOW (ref 101–111)
GFR calc Af Amer: >60 mL/min (ref >60)
Glucose, Bld: 395 mg/dL — ABNORMAL HIGH (ref 65–99)
Potassium: 4.2 mmol/L (ref 3.5–5.1)
SODIUM: 136 mmol/L (ref 135–145)

## 2017-05-27 LAB — GLUCOSE, CAPILLARY
GLUCOSE-CAPILLARY: 276 mg/dL — AB (ref 65–99)
Glucose-Capillary: 224 mg/dL — ABNORMAL HIGH (ref 65–99)

## 2017-05-27 LAB — TROPONIN I

## 2017-05-27 MED ORDER — IOPAMIDOL (ISOVUE-370) INJECTION 76%
75.0000 mL | Freq: Once | INTRAVENOUS | Status: AC | PRN
  Administered 2017-05-27: 75 mL via INTRAVENOUS

## 2017-05-27 MED ORDER — ATORVASTATIN CALCIUM 20 MG PO TABS
40.0000 mg | ORAL_TABLET | Freq: Every day | ORAL | Status: DC
  Administered 2017-05-27 – 2017-05-28 (×2): 40 mg via ORAL
  Filled 2017-05-27 (×2): qty 2

## 2017-05-27 MED ORDER — ENOXAPARIN SODIUM 40 MG/0.4ML ~~LOC~~ SOLN
40.0000 mg | SUBCUTANEOUS | Status: DC
  Administered 2017-05-27: 40 mg via SUBCUTANEOUS
  Filled 2017-05-27: qty 0.4

## 2017-05-27 MED ORDER — FLUTICASONE PROPIONATE 50 MCG/ACT NA SUSP
2.0000 | Freq: Every day | NASAL | Status: DC | PRN
  Filled 2017-05-27: qty 16

## 2017-05-27 MED ORDER — IPRATROPIUM-ALBUTEROL 0.5-2.5 (3) MG/3ML IN SOLN
3.0000 mL | Freq: Once | RESPIRATORY_TRACT | Status: AC
  Administered 2017-05-27: 3 mL via RESPIRATORY_TRACT
  Filled 2017-05-27: qty 3

## 2017-05-27 MED ORDER — ONDANSETRON HCL 4 MG PO TABS: 4.0000 mg | ORAL_TABLET | Freq: Four times a day (QID) | ORAL | Status: DC | PRN

## 2017-05-27 MED ORDER — METHYLPREDNISOLONE SODIUM SUCC 125 MG IJ SOLR
60.0000 mg | Freq: Four times a day (QID) | INTRAMUSCULAR | Status: DC
  Administered 2017-05-27 – 2017-05-28 (×3): 60 mg via INTRAVENOUS
  Filled 2017-05-27 (×3): qty 2

## 2017-05-27 MED ORDER — IPRATROPIUM-ALBUTEROL 0.5-2.5 (3) MG/3ML IN SOLN
3.0000 mL | Freq: Four times a day (QID) | RESPIRATORY_TRACT | Status: DC
  Administered 2017-05-27 – 2017-05-29 (×7): 3 mL via RESPIRATORY_TRACT
  Filled 2017-05-27 (×7): qty 3

## 2017-05-27 MED ORDER — CEFTRIAXONE SODIUM IN DEXTROSE 20 MG/ML IV SOLN
1.0000 g | Freq: Once | INTRAVENOUS | Status: AC
  Administered 2017-05-27: 1 g via INTRAVENOUS
  Filled 2017-05-27: qty 50

## 2017-05-27 MED ORDER — FENTANYL CITRATE (PF) 100 MCG/2ML IJ SOLN
50.0000 ug | Freq: Once | INTRAMUSCULAR | Status: AC
  Administered 2017-05-27: 50 ug via INTRAVENOUS

## 2017-05-27 MED ORDER — EZETIMIBE 10 MG PO TABS
10.0000 mg | ORAL_TABLET | Freq: Every day | ORAL | Status: DC
  Administered 2017-05-28 – 2017-05-29 (×2): 10 mg via ORAL
  Filled 2017-05-27 (×2): qty 1

## 2017-05-27 MED ORDER — INSULIN ASPART 100 UNIT/ML ~~LOC~~ SOLN
60.0000 [IU] | Freq: Three times a day (TID) | SUBCUTANEOUS | Status: DC
  Administered 2017-05-27: 60 [IU] via SUBCUTANEOUS
  Filled 2017-05-27: qty 1

## 2017-05-27 MED ORDER — ALBUTEROL SULFATE (2.5 MG/3ML) 0.083% IN NEBU: 2.5000 mg | INHALATION_SOLUTION | RESPIRATORY_TRACT | Status: DC | PRN

## 2017-05-27 MED ORDER — DULOXETINE HCL 60 MG PO CPEP
120.0000 mg | ORAL_CAPSULE | Freq: Every day | ORAL | Status: DC
  Administered 2017-05-28 – 2017-05-29 (×2): 120 mg via ORAL
  Filled 2017-05-27 (×2): qty 2

## 2017-05-27 MED ORDER — BISACODYL 10 MG RE SUPP: 10.0000 mg | Freq: Every day | RECTAL | Status: DC | PRN

## 2017-05-27 MED ORDER — DEXTROSE 5 % IV SOLN
500.0000 mg | INTRAVENOUS | Status: DC
  Administered 2017-05-28: 500 mg via INTRAVENOUS
  Filled 2017-05-27: qty 500

## 2017-05-27 MED ORDER — MOMETASONE FURO-FORMOTEROL FUM 200-5 MCG/ACT IN AERO
2.0000 | INHALATION_SPRAY | Freq: Two times a day (BID) | RESPIRATORY_TRACT | Status: DC
  Administered 2017-05-27 – 2017-05-29 (×4): 2 via RESPIRATORY_TRACT
  Filled 2017-05-27: qty 8.8

## 2017-05-27 MED ORDER — ACETAMINOPHEN 325 MG PO TABS
650.0000 mg | ORAL_TABLET | Freq: Four times a day (QID) | ORAL | Status: DC | PRN
  Administered 2017-05-27 – 2017-05-28 (×2): 650 mg via ORAL
  Filled 2017-05-27 (×2): qty 2

## 2017-05-27 MED ORDER — AMLODIPINE BESYLATE 5 MG PO TABS
2.5000 mg | ORAL_TABLET | Freq: Every day | ORAL | Status: DC
  Administered 2017-05-28 – 2017-05-29 (×2): 2.5 mg via ORAL
  Filled 2017-05-27 (×2): qty 1

## 2017-05-27 MED ORDER — AZITHROMYCIN 250 MG PO TABS: ORAL_TABLET | ORAL | 0 refills | Status: DC

## 2017-05-27 MED ORDER — QUETIAPINE FUMARATE 25 MG PO TABS
25.0000 mg | ORAL_TABLET | Freq: Two times a day (BID) | ORAL | Status: DC
  Administered 2017-05-27 – 2017-05-29 (×4): 25 mg via ORAL
  Filled 2017-05-27 (×4): qty 1

## 2017-05-27 MED ORDER — ZOLPIDEM TARTRATE 5 MG PO TABS
5.0000 mg | ORAL_TABLET | Freq: Every day | ORAL | Status: DC
  Administered 2017-05-27 – 2017-05-28 (×2): 5 mg via ORAL
  Filled 2017-05-27 (×2): qty 1

## 2017-05-27 MED ORDER — METOPROLOL TARTRATE 25 MG PO TABS
12.5000 mg | ORAL_TABLET | Freq: Two times a day (BID) | ORAL | Status: DC
  Administered 2017-05-27 – 2017-05-29 (×4): 12.5 mg via ORAL
  Filled 2017-05-27 (×4): qty 1

## 2017-05-27 MED ORDER — ACETAMINOPHEN 650 MG RE SUPP: 650.0000 mg | Freq: Four times a day (QID) | RECTAL | Status: DC | PRN

## 2017-05-27 MED ORDER — LINACLOTIDE 72 MCG PO CAPS
72.0000 ug | ORAL_CAPSULE | Freq: Every day | ORAL | Status: DC
  Administered 2017-05-28 – 2017-05-29 (×2): 72 ug via ORAL
  Filled 2017-05-27 (×2): qty 1

## 2017-05-27 MED ORDER — QUETIAPINE FUMARATE 200 MG PO TABS
400.0000 mg | ORAL_TABLET | Freq: Every day | ORAL | Status: DC
  Administered 2017-05-27 – 2017-05-28 (×2): 400 mg via ORAL
  Filled 2017-05-27 (×3): qty 2

## 2017-05-27 MED ORDER — FENTANYL CITRATE (PF) 100 MCG/2ML IJ SOLN
INTRAMUSCULAR | Status: AC
  Administered 2017-05-27: 50 ug via INTRAVENOUS
  Filled 2017-05-27: qty 2

## 2017-05-27 MED ORDER — CEFTRIAXONE SODIUM IN DEXTROSE 20 MG/ML IV SOLN
1.0000 g | INTRAVENOUS | Status: DC
  Filled 2017-05-27: qty 50

## 2017-05-27 MED ORDER — GABAPENTIN 300 MG PO CAPS
300.0000 mg | ORAL_CAPSULE | Freq: Two times a day (BID) | ORAL | Status: DC
  Administered 2017-05-27 – 2017-05-29 (×4): 300 mg via ORAL
  Filled 2017-05-27 (×4): qty 1

## 2017-05-27 MED ORDER — LAMOTRIGINE 25 MG PO TABS
150.0000 mg | ORAL_TABLET | Freq: Every day | ORAL | Status: DC
  Administered 2017-05-28 – 2017-05-29 (×2): 150 mg via ORAL
  Filled 2017-05-27 (×2): qty 6

## 2017-05-27 MED ORDER — ONDANSETRON HCL 4 MG/2ML IJ SOLN
4.0000 mg | Freq: Once | INTRAMUSCULAR | Status: AC
  Administered 2017-05-27: 4 mg via INTRAVENOUS
  Filled 2017-05-27: qty 2

## 2017-05-27 MED ORDER — FAMOTIDINE 20 MG PO TABS
20.0000 mg | ORAL_TABLET | Freq: Every day | ORAL | Status: DC
  Administered 2017-05-27 – 2017-05-28 (×2): 20 mg via ORAL
  Filled 2017-05-27 (×2): qty 1

## 2017-05-27 MED ORDER — ONDANSETRON HCL 4 MG/2ML IJ SOLN
4.0000 mg | Freq: Four times a day (QID) | INTRAMUSCULAR | Status: DC | PRN
  Administered 2017-05-29: 4 mg via INTRAVENOUS
  Filled 2017-05-27: qty 2

## 2017-05-27 MED ORDER — PANTOPRAZOLE SODIUM 40 MG PO TBEC
40.0000 mg | DELAYED_RELEASE_TABLET | Freq: Every day | ORAL | Status: DC
  Administered 2017-05-28 – 2017-05-29 (×2): 40 mg via ORAL
  Filled 2017-05-27: qty 1

## 2017-05-27 MED ORDER — LEVOTHYROXINE SODIUM 25 MCG PO TABS
75.0000 ug | ORAL_TABLET | Freq: Every day | ORAL | Status: DC
  Administered 2017-05-28 – 2017-05-29 (×2): 75 ug via ORAL
  Filled 2017-05-27 (×2): qty 1

## 2017-05-27 MED ORDER — SODIUM CHLORIDE 0.9 % IV SOLN
INTRAVENOUS | Status: DC
  Administered 2017-05-27 – 2017-05-29 (×3): via INTRAVENOUS

## 2017-05-27 MED ORDER — AZITHROMYCIN 500 MG PO TABS
500.0000 mg | ORAL_TABLET | Freq: Once | ORAL | Status: AC
  Administered 2017-05-27: 500 mg via ORAL
  Filled 2017-05-27: qty 1

## 2017-05-27 MED ORDER — TRAMADOL HCL 50 MG PO TABS
50.0000 mg | ORAL_TABLET | Freq: Four times a day (QID) | ORAL | Status: DC | PRN
  Administered 2017-05-27 – 2017-05-29 (×3): 50 mg via ORAL
  Filled 2017-05-27 (×3): qty 1

## 2017-05-27 MED ORDER — ASPIRIN 81 MG PO CHEW
243.0000 mg | CHEWABLE_TABLET | Freq: Once | ORAL | Status: AC
  Administered 2017-05-27: 243 mg via ORAL
  Filled 2017-05-27: qty 3

## 2017-05-27 MED ORDER — ASPIRIN EC 81 MG PO TBEC
81.0000 mg | DELAYED_RELEASE_TABLET | Freq: Every day | ORAL | Status: DC
  Administered 2017-05-28 – 2017-05-29 (×2): 81 mg via ORAL
  Filled 2017-05-27 (×2): qty 1

## 2017-05-27 MED ORDER — MAGNESIUM OXIDE 400 (241.3 MG) MG PO TABS
400.0000 mg | ORAL_TABLET | Freq: Three times a day (TID) | ORAL | Status: DC
  Administered 2017-05-27 – 2017-05-29 (×5): 400 mg via ORAL
  Filled 2017-05-27 (×5): qty 1

## 2017-05-27 MED ORDER — CANAGLIFLOZIN 100 MG PO TABS
100.0000 mg | ORAL_TABLET | Freq: Every day | ORAL | Status: DC
  Filled 2017-05-27: qty 1

## 2017-05-27 MED ORDER — INSULIN ASPART 100 UNIT/ML ~~LOC~~ SOLN
0.0000 [IU] | Freq: Three times a day (TID) | SUBCUTANEOUS | Status: DC
  Administered 2017-05-27: 5 [IU] via SUBCUTANEOUS
  Administered 2017-05-28: 9 [IU] via SUBCUTANEOUS
  Filled 2017-05-27 (×2): qty 1

## 2017-05-27 MED ORDER — DOCUSATE SODIUM 100 MG PO CAPS
100.0000 mg | ORAL_CAPSULE | Freq: Two times a day (BID) | ORAL | Status: DC
  Administered 2017-05-27 – 2017-05-29 (×4): 100 mg via ORAL
  Filled 2017-05-27 (×5): qty 1

## 2017-05-27 NOTE — ED Triage Notes (Addendum)
Pt arrives from home via [facility] with lower central chest pain/upper abdominal pain and some shortness of breath upon waking up. Pt now reports nausea. CBG 431 with no oral intake for EMS. VSS. Pt A&O and in NAD at this time.

## 2017-05-27 NOTE — H&P (Signed)
History and Physical    [name]:[id number] DOB: [birth year] DOA: 05/27/2017  Referring physician: Dr. Reita Yang  PCP: Stacey Courser, MD  Specialists: none  Chief Complaint: CP with SOB  HPI: [name] is a 51 y.o. female has a past medical history significant for HTN, DM, HLD, COPD, CAD, and Bipolar D/o now with acute onset left CP and SOB. Pain is worse with movement and deep inspiration. Some fever. No cough. No N/V/D. In ER, pt noted to have multilobar pneumonia on CT. Very tachypneic with ambulation. O2 sats borderline. She is now admitted.  Review of Systems: The patient denies anorexia, weight loss,, vision loss, decreased hearing, hoarseness,  syncope, peripheral edema, balance deficits, hemoptysis, abdominal pain, melena, hematochezia, severe indigestion/heartburn, hematuria, incontinence, genital sores, muscle weakness, suspicious skin lesions, transient blindness, difficulty walking, depression, unusual weight change, abnormal bleeding, enlarged lymph nodes, angioedema, and breast masses.   Past Medical History:  Diagnosis Date  . Asthma   . Bipolar depression ([name])   . CHF (congestive heart failure) ([place])   . Chronic headaches   . COPD (chronic obstructive pulmonary disease) ([name])   . Diabetic neuropathy ([name])   . GERD (gastroesophageal reflux disease)   . Headache   . Heart attack ([name])   . Hyperlipidemia   . Hypertension   . Hypomagnesemia   . Hypothyroid   . Irregular heart rhythm   . Liver lesion    Favored to be benign on CT; MRI of abd w/contract in Aug 2016. Liver Biopsy-Negative, March 2016  . Morbid obesity ([name])   . OSA (obstructive sleep apnea)   . Pinched nerve    Back  . RLS (restless legs syndrome)   . Seasonal allergies   . Sleep apnea   . Type 2 diabetes, uncontrolled, with neuropathy ([name]) 10/15/2015   Overview:  Microalbumin 7.7 12/2010: Hgb A1c 10.2% 12/2010 and 9.7% 03/2011, eye exam 10/2009 [name]   . Urinary incontinence   .  Vitamin D deficiency    Past Surgical History:  Procedure Laterality Date  . ABDOMINAL HYSTERECTOMY    . ABDOMINAL HYSTERECTOMY     Partial  . BLADDER SURGERY     x 2  . CHOLECYSTECTOMY    . LIVER BIOPSY  March 2016   Negative  . LUNG BIOPSY    . TUBAL LIGATION     Social History:  reports that she has never smoked. She has never used smokeless tobacco. She reports that she does not drink alcohol or use drugs.  Allergies  Allergen Reactions  . Morphine And Related     anaphylaxis  . Codeine     REACTION: itch, rash  . Codeine     itching  . Hydrocodone-Acetaminophen     REACTION: rash  . Mirtazapine     REACTION: closes throat  . Morphine And Related   . Sulfamethoxazole-Trimethoprim     REACTION: Whelps all over  . Vicodin [Hydrocodone-Acetaminophen]     itching    Family History  Problem Relation Age of Onset  . Diabetes Mother   . Heart disease Mother   . Hyperlipidemia Mother   . Hypertension Mother   . Kidney disease Mother   . Mental illness Mother   . Hypothyroidism Mother   . Stroke Mother   . Osteoporosis Mother   . Glaucoma Mother   . Congestive Heart Failure Mother   . Hypertension Father   . Asthma Daughter   . Cancer Daughter  throat  . Bipolar disorder Daughter   . Heart disease Maternal Grandfather   . Rheum arthritis Maternal Grandfather   . Cancer Maternal Grandfather        liver  . Kidney disease Maternal Grandfather   . Cancer Paternal Grandmother        lung  . Kidney disease Brother   . Bipolar disorder Daughter   . Hypertension Daughter   . Restless legs syndrome Daughter     Prior to Admission medications   Medication Sig Start Date End Date Taking? Authorizing Provider  albuterol (PROVENTIL HFA;VENTOLIN HFA) 108 (90 Base) MCG/ACT inhaler Inhale 2 puffs into the lungs every 4 (four) hours as needed for wheezing or shortness of breath. 11/19/15  Yes Lada, Satira Anis, MD  amLODipine (NORVASC) 2.5 MG tablet Take 1 tablet  (2.5 mg total) by mouth daily. 12/28/16  Yes Lada, Satira Anis, MD  aspirin EC 81 MG tablet Take 1 tablet (81 mg total) by mouth daily. 11/03/16  Yes Lada, Satira Anis, MD  atorvastatin (LIPITOR) 40 MG tablet Take 1 tablet (40 mg total) by mouth at bedtime. 04/13/17  Yes Lada, Satira Anis, MD  budesonide-formoterol (SYMBICORT) 160-4.5 MCG/ACT inhaler Inhale 2 puffs into the lungs as needed.    Yes [provider]  DULoxetine (CYMBALTA) 60 MG capsule Take 120 mg by mouth daily.   Yes [provider]  ezetimibe (ZETIA) 10 MG tablet Take 10 mg by mouth daily.  01/05/17  Yes [provider]  fluticasone (FLONASE) 50 MCG/ACT nasal spray Place 2 sprays into both nostrils daily. 01/09/17 01/09/18 Yes Lada, Satira Anis, MD  gabapentin (NEURONTIN) 300 MG capsule Take 1 capsule (300 mg total) by mouth 2 (two) times daily. 04/11/17  Yes Lada, Satira Anis, MD  HUMULIN R U-500 KWIKPEN 500 UNIT/ML kwikpen 60 Units 3 (three) times daily with meals.  12/06/16  Yes [provider]  Icosapent Ethyl (VASCEPA) 1 g CAPS Two pills by mouth twice a day for cholesterol (this replaces Lovaza) 09/29/16  Yes Lada, Satira Anis, MD  insulin aspart (NOVOLOG) 100 UNIT/ML injection Short-acting insulin TID ac as follows: 70-150=30units 151-200=35units 201-250=40units 251-300=45units 301-350=50units 351-400=55units 401-450=60units Patient taking differently: Inject 30-60 Units into the skin 3 (three) times daily with meals. Short-acting insulin TID ac as follows: 70-150=30units 151-200=35units 201-250=40units 251-300=45units 301-350=50units 351-400=55units 401-450=60units 09/29/16  Yes Lada, Satira Anis, MD  JARDIANCE 25 MG TABS tablet Take 25 mg by mouth daily. 02/26/17  Yes [provider]  lamoTRIgine (LAMICTAL) 150 MG tablet Take 150 mg by mouth daily.  03/22/15  Yes [provider]  levothyroxine (SYNTHROID, LEVOTHROID) 75 MCG tablet Take 1 tablet (75 mcg total) by mouth daily. 10/19/16  Yes  Lada, Satira Anis, MD  linaclotide (LINZESS) 72 MCG capsule Take 1 capsule (72 mcg total) by mouth daily before breakfast. 08/21/16  Yes [name], MD  magnesium oxide (MAG-OX) 400 MG tablet Take 1 tablet (400 mg total) by mouth 3 (three) times daily. 11/15/16  Yes Lada, Satira Anis, MD  metoprolol tartrate (LOPRESSOR) 25 MG tablet Take 0.5 tablets (12.5 mg total) by mouth 2 (two) times daily. 04/20/17  Yes Lada, Satira Anis, MD  Multiple Vitamin (MULTIVITAMIN) capsule Take 1 capsule by mouth daily.    Yes [provider]  pantoprazole (PROTONIX) 40 MG tablet Take 1 tablet (40 mg total) by mouth daily. 08/21/16  Yes [name], MD  QUEtiapine (SEROQUEL) 25 MG tablet Take 25 mg by mouth 2 (two) times daily.  Yes [provider]  QUEtiapine (SEROQUEL) 400 MG tablet Take 400 mg by mouth at bedtime.    Yes [provider]  ranitidine (ZANTAC) 300 MG tablet Take 1 tablet (300 mg total) by mouth at bedtime. 05/15/16  Yes Lada, Satira Anis, MD  zolpidem (AMBIEN) 10 MG tablet Take 10 mg by mouth at bedtime.  07/26/15  Yes [provider]  ACCU-CHEK AVIVA PLUS test strip Check FSBS three times a day; LON 99 months, DX E11.40 05/10/16   Lada, Satira Anis, MD  ACCU-CHEK SOFTCLIX LANCETS lancets Check FSBS three times a day; LON 99 months, DX E11.40 05/10/16   Lada, Satira Anis, MD  azithromycin (ZITHROMAX) 250 MG tablet One tab daily for 4 more days. 05/27/17   Lisa Roca, MD  clobetasol cream (TEMOVATE) 4.92 % Apply 1 application topically 2 (two) times daily. If needed; too strong for face, underarms, groin, children Patient not taking: Reported on 05/27/2017 02/02/17   Stacey Courser, MD  Insulin Syringe-Needle U-100 Flossie Buffy INSULIN SYRINGE) 31G X 1/4" 0.3 ML MISC Inject 1 each into the skin 5 (five) times daily as needed. 09/22/16   Lada, Satira Anis, MD  ondansetron (ZOFRAN ODT) 4 MG disintegrating tablet Take 1 tablet (4 mg total) by mouth every 8 (eight) hours as needed for nausea  or vomiting. Patient not taking: Reported on 05/27/2017 10/28/16   Earleen Newport, MD  oxyCODONE-acetaminophen (ROXICET) 5-325 MG tablet Take 1 tablet by mouth every 8 (eight) hours as needed for severe pain. Patient not taking: Reported on 05/27/2017 05/18/17   Edrick Kins, DPM   Physical Exam: Vitals:   05/27/17 1330 05/27/17 1430 05/27/17 1530 05/27/17 1545  BP: 116/65 (!) 157/92 (!) 144/93   Pulse: 98 (!) 106 (!) 103 (!) 102  Resp: 15 (!) 23  (!) 27  Temp:      TempSrc:      SpO2: 91% 96% 90% 92%  Weight:      Height:         General:  No apparent distress, WDWN, [place]/AT  Eyes: PERRL, EOMI, no scleral icterus, conjunctiva clear  ENT: moist oropharynx without exudate, TM's benign, dentition good  Neck: supple, no lymphadenopathy. No bruits or thyromegaly  Cardiovascular: regular rate without MRG; 2+ peripheral pulses, no JVD, no peripheral edema  Respiratory: left-sided rhonchi without wheezes or rales. Dullness at left base. Respiratory effort increased  Abdomen: soft, non tender to palpation, positive bowel sounds, no guarding, no rebound  Skin: no rashes or lesions  Musculoskeletal: normal bulk and tone, no joint swelling  Psychiatric: normal mood and affect, A&OX3  Neurologic: CN 2-12 grossly intact, Motor strength 5/5 in all 4 groups with symmetric DTR's and non-focal sensory exam  Labs on Admission:  Basic Metabolic Panel:  Recent Labs Lab 05/27/17 0911  NA 136  K 4.2  CL 100*  CO2 23  GLUCOSE 395*  BUN 9  CREATININE 1.01*  CALCIUM 8.9   Liver Function Tests: No results for input(s): AST, ALT, ALKPHOS, BILITOT, PROT, ALBUMIN in the last 168 hours. No results for input(s): LIPASE, AMYLASE in the last 168 hours. No results for input(s): AMMONIA in the last 168 hours. CBC:  Recent Labs Lab 05/27/17 0911  WBC 7.1  HGB 13.2  HCT 39.0  MCV 86.7  PLT 179   Cardiac Enzymes:  Recent Labs Lab 05/27/17 0911 05/27/17 1253  TROPONINI <0.03  <0.03    BNP (last 3 results) No results for input(s): BNP in the  last 8760 hours.  ProBNP (last 3 results) No results for input(s): PROBNP in the last 8760 hours.  CBG: No results for input(s): GLUCAP in the last 168 hours.  Radiological Exams on Admission: Dg Chest 2 View  Result Date: 05/27/2017 CLINICAL DATA:  Chest pain for several hours EXAM: CHEST  2 VIEW COMPARISON:  08/31/2014 FINDINGS: Cardiac shadow is within normal limits. The lungs are well aerated bilaterally. Some patchy changes are noted in the left mid lung consistent with acute infiltrate. No sizable effusion is noted. No pneumothorax is seen. Degenerative changes of the thoracic spine are noted. IMPRESSION: Left midlung infiltrate. Electronically Signed   By: Inez Catalina M.D.   On: 05/27/2017 10:01   Ct Angio Chest Pe W/cm &/or Wo Cm  Result Date: 05/27/2017 CLINICAL DATA:  51 year old female with central chest pain, upper abdominal pain, shortness of breath. Nausea. EXAM: CT ANGIOGRAPHY CHEST WITH CONTRAST TECHNIQUE: Multidetector CT imaging of the chest was performed using the standard protocol during bolus administration of intravenous contrast. Multiplanar CT image reconstructions and MIPs were obtained to evaluate the vascular anatomy. CONTRAST:  75 mL Isovue 370 COMPARISON:  Chest radiographs 0929 hours today. Chest CT 01/02/2012. FINDINGS: Cardiovascular: Adequate contrast bolus timing in the pulmonary arterial tree. No focal filling defect identified in the pulmonary arteries to suggest acute pulmonary embolism. Negative visualized aorta. There is evidence of some calcified coronary artery atherosclerosis. No pericardial effusion. Mediastinum/Nodes: Small left hilar and prevascular lymph nodes may be mildly increased since 2013, such as reactive. Other mediastinal and right hilar nodes are normal. Lungs/Pleura: Consolidation in the posterior inferior aspect of the left upper lobe adjacent to the hilum with extension  into the lingula. No left pleural effusion. The consolidation extends along the major fissure but the left lower lobe is spared. Major airways are patent. There is mild nonspecific peribronchial ground-glass opacity in the right upper lobe (series 6, image 30). No other abnormal right lung opacity. No right pleural effusion. Upper Abdomen: Surgically absent gallbladder. Possible hepatomegaly, but stable visualized upper abdominal viscera since 2013. Musculoskeletal: Negative. Review of the MIP images confirms the above findings. IMPRESSION: 1.  Negative for acute pulmonary embolus. 2. Left lung multi-lobar pneumonia. No left pleural effusion. Reactive appearing left mediastinal lymph nodes. Followup PA and lateral chest X-ray is recommended in 3-4 weeks following trial of antibiotic therapy to ensure resolution and exclude underlying malignancy. 3. Possible early extension of pneumonia into the right upper lobe. 4. Mild calcified atherosclerosis suspected. Electronically Signed   By: Genevie Ann M.D.   On: 05/27/2017 11:37    EKG: Independently reviewed.  Assessment/Plan Principal Problem:   Multifocal pneumonia Active Problems:   HYPERTENSION, BENIGN ESSENTIAL   Type 2 diabetes, uncontrolled, with neuropathy (HCC)   Pleuritic chest pain   Will admit to floor with O2, IV ABX, IV steroids, IV fluids, and SVN's. Follow sugars. Continue outpatient meds. Repeat labs in AM.  Diet: low carb, low salt Fluids: NS@100  DVT Prophylaxis: Lovenox  Code Status: FULL  Family Communication: none  Disposition Plan: home  Time spent: 50 min

## 2017-05-27 NOTE — ED Notes (Signed)
Patient transported to XR. 

## 2017-05-27 NOTE — Discharge Instructions (Signed)
You are being treated for pneumonia, started her antibiotic tomorrow and continue to completion.  Return to the emergency department immediately for any fever, new or worsening shortness of breath, trouble breathing, chest pain, dizziness or passing out, weakness, numbness, confusion or altered mental status, or any other symptoms concerning to you.

## 2017-05-27 NOTE — ED Notes (Signed)
Ambulating in hall HR 120, resp 30, sats 96% ra Dr Reita Cliche notified

## 2017-05-27 NOTE — ED Provider Notes (Signed)
[facility] Emergency Department Provider Note ____________________________________________   I have reviewed the triage vital signs and the triage nursing note.  HISTORY  Chief Complaint Chest Pain   Historian Patient  HPI [name] is a 51 y.o. female With a history of CHF, COPD, GERD, as well as coronary artery disease, presents with a complaint of shortness of breath that she noticed this morning upon waking. Slightly pleuritic in nature. She does have a cough. She does use an inhaler. States that she took her baby aspirin already this morning.  Pain is slightly less than what it was this morning, but still there. Location is central and left side of the chest.  Denies fever.  Denies nausea or vomiting. Denies neck pain or back pain arm pain.  Symptoms are moderate, breathing makes it slightly worse.    Past Medical History:  Diagnosis Date  . Asthma   . Bipolar depression ([place])   . CHF (congestive heart failure) ([place])   . Chronic headaches   . COPD (chronic obstructive pulmonary disease) ([place])   . Diabetic neuropathy ([place])   . GERD (gastroesophageal reflux disease)   . Headache   . Heart attack ([place])   . Hyperlipidemia   . Hypertension   . Hypomagnesemia   . Hypothyroid   . Irregular heart rhythm   . Liver lesion    Favored to be benign on CT; MRI of abd w/contract in Aug 2016. Liver Biopsy-Negative, March 2016  . Morbid obesity ([place])   . OSA (obstructive sleep apnea)   . Pinched nerve    Back  . RLS (restless legs syndrome)   . Seasonal allergies   . Sleep apnea   . Type 2 diabetes, uncontrolled, with neuropathy ([place]) 10/15/2015   Overview:  Microalbumin 7.7 12/2010: Hgb A1c 10.2% 12/2010 and 9.7% 03/2011, eye exam 10/2009 [facility] DM clinic   . Urinary incontinence   . Vitamin D deficiency     Patient Active Problem List   Diagnosis Date Noted  . Hypertriglyceridemia 12/21/2016  . Pansinusitis 11/15/2016  . 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 Breast cancer screening 09/28/2016  . Urine frequency 09/28/2016  . Preventative health care 09/28/2016  . Fatigue 05/01/2016  . Urinary tract infection 03/14/2016  . Adenomatous colon polyp 10/15/2015  . Airway hyperreactivity 10/15/2015  . Bipolar affective disorder ([place]) 10/15/2015  . CN (constipation) 10/15/2015  . CAFL (chronic airflow limitation) ([place]) 10/15/2015  . Drug-induced hepatic toxicity 10/15/2015  . Blood in feces 10/15/2015  . HLD (hyperlipidemia) 10/15/2015  . Hypoxemia 10/15/2015  . NASH (nonalcoholic steatohepatitis) 10/15/2015  . Morbid obesity ([place]) 10/15/2015  . Restless leg syndrome 10/15/2015  . Type 2 diabetes, uncontrolled, with neuropathy ([place]) 10/15/2015  . Elevated alkaline phosphatase level 09/22/2015  . Urinary bladder incontinence 09/22/2015  . Low back pain with left-sided sciatica 08/18/2015  . Encounter for monitoring tricyclic antidepressant therapy 05/13/2015  . Bipolar disorder ([place]) 05/11/2015  . Liver disease 05/11/2015  . Diabetes ([place]) 05/11/2015  . Apnea, sleep 05/05/2015  . Heart valve disease 05/05/2015  . Chronic kidney disease, stage III (moderate) 03/15/2015  . Migraine 03/12/2015  . COPD (chronic obstructive pulmonary disease) ([place]) 01/20/2015  . 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 GERD (gastroesophageal reflux disease) 01/20/2015  . Hypothyroid   . Vitamin D deficiency   . Urinary incontinence   . Liver lesion   . Hypomagnesemia   . Obstructive sleep apnea 10/20/2013  .  PERIPHERAL NEUROPATHY 03/04/2009  . HEMORRHOIDS, EXTERNAL 09/03/2008  . ALLERGIC RHINITIS 07/04/2007  . ASTHMA, PERSISTENT, MODERATE 07/04/2007  . INTERSTITIAL CYSTITIS 07/04/2007  . HYPERTENSION, BENIGN ESSENTIAL 06/19/2007    Past Surgical History:  Procedure Laterality Date  . ABDOMINAL HYSTERECTOMY    . ABDOMINAL HYSTERECTOMY     Partial  . BLADDER SURGERY     x 2  . CHOLECYSTECTOMY    . LIVER  BIOPSY  March 2016   Negative  . LUNG BIOPSY    . TUBAL LIGATION      Prior to Admission medications   Medication Sig Start Date End Date Taking? Authorizing Provider  albuterol (PROVENTIL HFA;VENTOLIN HFA) 108 (90 Base) MCG/ACT inhaler Inhale 2 puffs into the lungs every 4 (four) hours as needed for wheezing or shortness of breath. 11/19/15  Yes Lada, Satira Anis, MD  amLODipine (NORVASC) 2.5 MG tablet Take 1 tablet (2.5 mg total) by mouth daily. 12/28/16  Yes Lada, Satira Anis, MD  aspirin EC 81 MG tablet Take 1 tablet (81 mg total) by mouth daily. 11/03/16  Yes Lada, Satira Anis, MD  atorvastatin (LIPITOR) 40 MG tablet Take 1 tablet (40 mg total) by mouth at bedtime. 04/13/17  Yes Lada, Satira Anis, MD  budesonide-formoterol (SYMBICORT) 160-4.5 MCG/ACT inhaler Inhale 2 puffs into the lungs as needed.    Yes [provider]  DULoxetine (CYMBALTA) 60 MG capsule Take 120 mg by mouth daily.   Yes [provider]  ezetimibe (ZETIA) 10 MG tablet Take 10 mg by mouth daily.  01/05/17  Yes [provider]  fluticasone (FLONASE) 50 MCG/ACT nasal spray Place 2 sprays into both nostrils daily. 01/09/17 01/09/18 Yes Lada, Satira Anis, MD  gabapentin (NEURONTIN) 300 MG capsule Take 1 capsule (300 mg total) by mouth 2 (two) times daily. 04/11/17  Yes Lada, Satira Anis, MD  HUMULIN R U-500 KWIKPEN 500 UNIT/ML kwikpen 60 Units 3 (three) times daily with meals.  12/06/16  Yes [provider]  Icosapent Ethyl (VASCEPA) 1 g CAPS Two pills by mouth twice a day for cholesterol (this replaces Lovaza) 09/29/16  Yes Lada, Satira Anis, MD  insulin aspart (NOVOLOG) 100 UNIT/ML injection Short-acting insulin TID ac as follows: 70-150=30units 151-200=35units 201-250=40units 251-300=45units 301-350=50units 351-400=55units 401-450=60units Patient taking differently: Inject 30-60 Units into the skin 3 (three) times daily with meals. Short-acting insulin TID ac as  follows: 70-150=30units 151-200=35units 201-250=40units 251-300=45units 301-350=50units 351-400=55units 401-450=60units 09/29/16  Yes Lada, Satira Anis, MD  JARDIANCE 25 MG TABS tablet Take 25 mg by mouth daily. 02/26/17  Yes [provider]  lamoTRIgine (LAMICTAL) 150 MG tablet Take 150 mg by mouth daily.  03/22/15  Yes [provider]  levothyroxine (SYNTHROID, LEVOTHROID) 75 MCG tablet Take 1 tablet (75 mcg total) by mouth daily. 10/19/16  Yes Lada, Satira Anis, MD  linaclotide (LINZESS) 72 MCG capsule Take 1 capsule (72 mcg total) by mouth daily before breakfast. 08/21/16  Yes [name], MD  magnesium oxide (MAG-OX) 400 MG tablet Take 1 tablet (400 mg total) by mouth 3 (three) times daily. 11/15/16  Yes Lada, Satira Anis, MD  metoprolol tartrate (LOPRESSOR) 25 MG tablet Take 0.5 tablets (12.5 mg total) by mouth 2 (two) times daily. 04/20/17  Yes Lada, Satira Anis, MD  Multiple Vitamin (MULTIVITAMIN) capsule Take 1 capsule by mouth daily.    Yes [provider]  pantoprazole (PROTONIX) 40 MG tablet Take 1 tablet (40 mg total) by mouth daily. 08/21/16  Yes [name], MD  QUEtiapine (SEROQUEL) 25  MG tablet Take 25 mg by mouth 2 (two) times daily.    Yes [provider]  QUEtiapine (SEROQUEL) 400 MG tablet Take 400 mg by mouth at bedtime.    Yes [provider]  ranitidine (ZANTAC) 300 MG tablet Take 1 tablet (300 mg total) by mouth at bedtime. 05/15/16  Yes Lada, Satira Anis, MD  zolpidem (AMBIEN) 10 MG tablet Take 10 mg by mouth at bedtime.  07/26/15  Yes [provider]  ACCU-CHEK AVIVA PLUS test strip Check FSBS three times a day; LON 99 months, DX E11.40 05/10/16   Lada, Satira Anis, MD  ACCU-CHEK SOFTCLIX LANCETS lancets Check FSBS three times a day; LON 99 months, DX E11.40 05/10/16   Lada, Satira Anis, MD  azithromycin (ZITHROMAX) 250 MG tablet One tab daily for 4 more days. 05/27/17   Lisa Roca, MD  clobetasol cream (TEMOVATE) 6.38 % Apply 1  application topically 2 (two) times daily. If needed; too strong for face, underarms, groin, children Patient not taking: Reported on 05/27/2017 02/02/17   Arnetha Courser, MD  Insulin Syringe-Needle U-100 Flossie Buffy INSULIN SYRINGE) 31G X 1/4" 0.3 ML MISC Inject 1 each into the skin 5 (five) times daily as needed. 09/22/16   Lada, Satira Anis, MD  ondansetron (ZOFRAN ODT) 4 MG disintegrating tablet Take 1 tablet (4 mg total) by mouth every 8 (eight) hours as needed for nausea or vomiting. Patient not taking: Reported on 05/27/2017 10/28/16   Earleen Newport, MD  oxyCODONE-acetaminophen (ROXICET) 5-325 MG tablet Take 1 tablet by mouth every 8 (eight) hours as needed for severe pain. Patient not taking: Reported on 05/27/2017 05/18/17   Edrick Kins, DPM    Allergies  Allergen Reactions  . Morphine And Related     anaphylaxis  . Codeine     REACTION: itch, rash  . Codeine     itching  . Hydrocodone-Acetaminophen     REACTION: rash  . Mirtazapine     REACTION: closes throat  . Morphine And Related   . Sulfamethoxazole-Trimethoprim     REACTION: Whelps all over  . Vicodin [Hydrocodone-Acetaminophen]     itching    Family History  Problem Relation Age of Onset  . Diabetes Mother   . Heart disease Mother   . Hyperlipidemia Mother   . Hypertension Mother   . Kidney disease Mother   . Mental illness Mother   . Hypothyroidism Mother   . Stroke Mother   . Osteoporosis Mother   . Glaucoma Mother   . Congestive Heart Failure Mother   . Hypertension Father   . Asthma Daughter   . Cancer Daughter        throat  . Bipolar disorder Daughter   . Heart disease Maternal Grandfather   . Rheum arthritis Maternal Grandfather   . Cancer Maternal Grandfather        liver  . Kidney disease Maternal Grandfather   . Cancer Paternal Grandmother        lung  . Kidney disease Brother   . Bipolar disorder Daughter   . Hypertension Daughter   . Restless legs syndrome Daughter     Social  History Social History  Substance Use Topics  . Smoking status: Never Smoker  . Smokeless tobacco: Never Used  . Alcohol use No    Review of Systems  Constitutional: Negative for fever. Eyes: Negative for visual changes. ENT: Negative for sore throat. Cardiovascular: positivefor chest pain. Respiratory: positivefor shortness of breath. Gastrointestinal: Negative for  abdominal pain, vomiting and diarrhea. Genitourinary: Negative for dysuria. Musculoskeletal: Negative for back pain. Skin: Negative for rash. Neurological: Negative for headache.  ____________________________________________   PHYSICAL EXAM:  VITAL SIGNS: ED Triage Vitals  Enc Vitals Group     BP 05/27/17 0902 (!) 143/93     Pulse Rate 05/27/17 0902 96     Resp 05/27/17 0902 13     Temp 05/27/17 0902 98.3 F (36.8 C)     Temp Source 05/27/17 0902 Oral     SpO2 05/27/17 0902 93 %     Weight 05/27/17 0903 192 lb (87.1 kg)     Height 05/27/17 0903 4' 11"  (1.499 m)     Head Circumference --      Peak Flow --      Pain Score 05/27/17 0902 8     Pain Loc --      Pain Edu? --      Excl. in [place]? --      Constitutional: Alert and oriented. Well appearing and in no distress. HEENT   Head: Normocephalic and atraumatic.      Eyes: Conjunctivae are normal. Pupils equal and round.       Ears:         Nose: No congestion/rhinnorhea.   Mouth/Throat: Mucous membranes are moist.   Neck: No stridor. Cardiovascular/Chest: Normal rate, regular rhythm.  No murmurs, rubs, or gallops. Respiratory: Normal respiratory effort without tachypnea nor retractions. moderately tight breath sounds without obvious wheezing. No rhonchi. Gastrointestinal: Soft. No distention, no guarding, no rebound. Nontender.  obese  Genitourinary/rectal:Deferred Musculoskeletal: Nontender with normal range of motion in all extremities. No joint effusions.  No lower extremity tenderness.  No edema. Neurologic:  Normal speech and language.  No gross or focal neurologic deficits are appreciated. Skin:  Skin is warm, dry and intact. No rash noted. Psychiatric: Mood and affect are normal. Speech and behavior are normal. Patient exhibits appropriate insight and judgment.   ____________________________________________  LABS (pertinent positives/negatives) I, Lisa Roca, MD the attending physician have reviewed the labs noted below.  Labs Reviewed  BASIC METABOLIC PANEL - Abnormal; Notable for the following:       Result Value   Chloride 100 (*)    Glucose, Bld 395 (*)    Creatinine, Ser 1.01 (*)    All other components within normal limits  CULTURE, BLOOD (ROUTINE X 2)  CULTURE, BLOOD (ROUTINE X 2)  CBC  TROPONIN I  TROPONIN I    ____________________________________________    EKG I, Lisa Roca, MD, the attending physician have personally viewed and interpreted all ECGs.  94 bpm. Normal sinus rhythm. Likely left axis deviation. Nonspecific ST and T-wave. ____________________________________________  RADIOLOGY All Xrays were viewed by me.  Imaging interpreted by Radiologist, and I, Lisa Roca, MD the attending physician have reviewed the radiologist interpretation noted below.  chest x-ray two-view: Left midlung infiltrate.  CT chest with contrast for PE:  IMPRESSION: 1.  Negative for acute pulmonary embolus. 2. Left lung multi-lobar pneumonia. No left pleural effusion. Reactive appearing left mediastinal lymph nodes. Followup PA and lateral chest X-ray is recommended in 3-4 weeks following trial of antibiotic therapy to ensure resolution and exclude underlying malignancy. 3. Possible early extension of pneumonia into the right upper lobe. 4. Mild calcified atherosclerosis suspected. __________________________________________  PROCEDURES  Procedure(s) performed: None  Critical Care performed: None  ____________________________________________   ED COURSE / ASSESSMENT AND PLAN  Pertinent labs  & imaging results that were available during my care  of the patient were reviewed by me and considered in my medical decision making (see chart for details).   Ms. Heavrin is overall well-appearing but she is complaining of left sided chest discomfort which is pleuritic in nature. Chest x-ray read by radiologist as possible infiltrate, given her symptoms and the x-ray being fairly minimal in terms of my read, I did speak with the patient regarding obtaining chest CT for further elucidation and rule out of PE.  Multilobar pneumonia left on Ct and antibiotics initiated.  I think she has evidence of sepsis or now. She has a left shift but no early white blood count. Her heart rate has been between 80 and 101. Patient states that she is on rate control medication and does not feel systemically ill. She's had no hypotension. Her O2 sat drops below between 93% and 97% on room air at rest.  I am going to have her walk to see how she tolerates this.  I will go ahead and send a blood culture, but am intending on treating her as an outpatient. Patient understands what to return for.   DIFFERENTIAL DIAGNOSIS: Differential diagnosis includes, but is not limited to, ACS, aortic dissection, pulmonary embolism, cardiac tamponade, pneumothorax, pneumonia, pericarditis/myocarditis, GI-related causes including esophagitis/gastritis, and musculoskeletal chest wall pain.    CONSULTATIONS:  None  Patient / Family / Caregiver informed of clinical course, medical decision-making process, and agree with plan.   I discussed return precautions, follow-up instructions, and discharge instructions with patient and/or family.  Discharge Instructions :  You are being treated for pneumonia, started her antibiotic tomorrow and continue to completion.  Return to the emergency department immediately for any fever, new or worsening shortness of breath, trouble breathing, chest pain, dizziness or passing out, weakness, numbness,  confusion or altered mental status, or any other symptoms concerning to you.     addended to change disposition.  when I reviewed with the patient, heart rate down to 106. Patient attempted to stand up and walk and her heart rate increased significantly and as well as she became quite dyspneic and tachypneic.  I'm concerned that her pneumonia may be progressing and worsening and I'm recommending hospital observation for the potential sepsis.  ___________________________________________   FINAL CLINICAL IMPRESSION(S) / ED DIAGNOSES   Final diagnoses:  Pneumonia, unspecified organism              Note: This dictation was prepared with Dragon dictation. Any transcriptional errors that result from this process are unintentional    Lisa Roca, MD 05/27/17 [phone]

## 2017-05-27 NOTE — ED Notes (Signed)
Attempted IV x2 (E.Hunter, RN & this RN). Unsuccessful.

## 2017-05-27 NOTE — Progress Notes (Signed)
Pt requests to speak with SW during this admission regarding home health care currently in place [facility]).

## 2017-05-28 DIAGNOSIS — R0602 Shortness of breath: Secondary | ICD-10-CM | POA: Diagnosis not present

## 2017-05-28 DIAGNOSIS — J189 Pneumonia, unspecified organism: Secondary | ICD-10-CM | POA: Diagnosis not present

## 2017-05-28 LAB — COMPREHENSIVE METABOLIC PANEL
ALBUMIN: 3.7 g/dL (ref 3.5–5.0)
ALK PHOS: 82 U/L (ref 38–126)
ALT: 31 U/L (ref 14–54)
AST: 30 U/L (ref 15–41)
Anion gap: 11 (ref 5–15)
BUN: 13 mg/dL (ref 6–20)
CALCIUM: 9 mg/dL (ref 8.9–10.3)
CO2: 27 mmol/L (ref 22–32)
CREATININE: 0.96 mg/dL (ref 0.44–1.00)
Chloride: 99 mmol/L — ABNORMAL LOW (ref 101–111)
GFR calc Af Amer: >60 mL/min (ref >60)
GFR calc non Af Amer: >60 mL/min (ref >60)
GLUCOSE: 203 mg/dL — AB (ref 65–99)
Potassium: 4.3 mmol/L (ref 3.5–5.1)
SODIUM: 137 mmol/L (ref 135–145)
Total Bilirubin: 0.8 mg/dL (ref 0.3–1.2)
Total Protein: 6.5 g/dL (ref 6.5–8.1)

## 2017-05-28 LAB — CBC
HCT: 39.2 % (ref 35.0–47.0)
HEMOGLOBIN: 13.3 g/dL (ref 12.0–16.0)
MCH: 29.3 pg (ref 26.0–34.0)
MCHC: 33.9 g/dL (ref 32.0–36.0)
MCV: 86.4 fL (ref 80.0–100.0)
Platelets: 182 10*3/uL (ref 150–440)
RBC: 4.53 MIL/uL (ref 3.80–5.20)
RDW: 14.3 % (ref 11.5–14.5)
WBC: 6.3 10*3/uL (ref 3.6–11.0)

## 2017-05-28 LAB — GLUCOSE, CAPILLARY
GLUCOSE-CAPILLARY: 349 mg/dL — AB (ref 65–99)
GLUCOSE-CAPILLARY: 461 mg/dL — AB (ref 65–99)
GLUCOSE-CAPILLARY: 303 mg/dL — AB (ref 65–99)
Glucose-Capillary: 544 mg/dL (ref 65–99)
Glucose-Capillary: 599 mg/dL (ref 65–99)

## 2017-05-28 MED ORDER — INSULIN REGULAR HUMAN (CONC) 500 UNIT/ML ~~LOC~~ SOPN
60.0000 [IU] | PEN_INJECTOR | Freq: Three times a day (TID) | SUBCUTANEOUS | Status: DC
  Administered 2017-05-28: 60 [IU] via SUBCUTANEOUS
  Filled 2017-05-28: qty 3

## 2017-05-28 MED ORDER — INSULIN REGULAR HUMAN (CONC) 500 UNIT/ML ~~LOC~~ SOPN
60.0000 [IU] | PEN_INJECTOR | Freq: Three times a day (TID) | SUBCUTANEOUS | Status: DC
  Administered 2017-05-28 – 2017-05-29 (×3): 60 [IU] via SUBCUTANEOUS
  Filled 2017-05-28: qty 3

## 2017-05-28 MED ORDER — INSULIN ASPART 100 UNIT/ML ~~LOC~~ SOLN
0.0000 [IU] | Freq: Three times a day (TID) | SUBCUTANEOUS | Status: DC
  Administered 2017-05-28 (×2): 25 [IU] via SUBCUTANEOUS
  Administered 2017-05-29: 10 [IU] via SUBCUTANEOUS
  Filled 2017-05-28 (×3): qty 1

## 2017-05-28 MED ORDER — INSULIN REGULAR HUMAN (CONC) 500 UNIT/ML ~~LOC~~ SOPN
70.0000 [IU] | PEN_INJECTOR | Freq: Three times a day (TID) | SUBCUTANEOUS | Status: DC
  Filled 2017-05-28: qty 3

## 2017-05-28 MED ORDER — ENOXAPARIN SODIUM 40 MG/0.4ML ~~LOC~~ SOLN
40.0000 mg | Freq: Two times a day (BID) | SUBCUTANEOUS | Status: DC
  Administered 2017-05-28 – 2017-05-29 (×3): 40 mg via SUBCUTANEOUS
  Filled 2017-05-28 (×3): qty 0.4

## 2017-05-28 MED ORDER — DEXTROSE 5 % IV SOLN
1.0000 g | INTRAVENOUS | Status: DC
  Administered 2017-05-28: 1 g via INTRAVENOUS
  Filled 2017-05-28: qty 10

## 2017-05-28 NOTE — Progress Notes (Addendum)
Inpatient Diabetes Program Recommendations  AACE/ADA: New Consensus Statement on Inpatient Glycemic Control (2015)  Target Ranges:  Prepandial:   less than 140 mg/dL      Peak postprandial:   less than 180 mg/dL (1-2 hours)      Critically ill patients:  140 - 180 mg/dL   Results for [name], [name] (MRN [id number]) as of 05/28/2017 14:57  Ref. Range 05/27/2017 17:36 05/27/2017 20:57 05/28/2017 07:45 05/28/2017 11:54 05/28/2017 13:34  Glucose-Capillary Latest Ref Range: 65 - 99 mg/dL 276 (H) 224 (H) 349 (H) 544 (HH) 599 (HH)    Admit CP/ SOB.   History: DM, COPD  Home DM Meds: U-500 Insulin 60 units TID        Novolog 10-30 units TID per SSI (starts coverage at 201 mg/dl)- Pharmacy called and Verified home meds with Home Pharmacy  Current Orders: U-500 Insulin 70 units TID       Novolog 10-25 units TID per SSI (starts at 201 mg/dl)       MD- Note that U-500 Insulin not started until 12pm today.  CBG extremely high at 12pm (544 mg/dl).  Glucose likely elevated because patient did not have any U-500 Insulin on board until today.  Note that U-500 Insulin dose increased to 70 units TID today.  Have asked MD to decrease dose back to 60 units TID with meals since patient has not received her total home dose of U-500 Insulin yet.    Will follow daily and attempt to assist with glucose management.      --Will follow patient during hospitalization--  Wyn Quaker RN, MSN, CDE Diabetes Coordinator Inpatient Glycemic Control Team Team Pager: [phone] (8a-5p)

## 2017-05-28 NOTE — Progress Notes (Addendum)
Diabetes Medication Reconciliation:  I spoke with patient regarding her outpatient insulin regimen. Patient reports taking Humulin R U-500 Kiwikpen 60 units TID with meals. Patient reports using the Humulin U-500 pen and denies drawing dose up from a vial.  She reports also taking Novolog sliding scale with meals.  Confirmed the following doses with [facility] in [place], [place]:  Humulin R U-500 Kwikpen 60 units TID with meals  Last filled: 05/01/17  MD: Dr. Ronnald Collum  Novolog sliding scale with meals:  10 units for BG 201-250  15 units for BG 251-300  20 units for BG 301-350  25 units for BG 351-400  30 units for BG >400  Discussed with MD - will enter orders for home medications per discussion.  Lenis Noon, PharmD, BCPS Clinical Pharmacist 05/28/17 8:31 AM  Addendum: Confirmed current dosing with Dr. De [place] office as well. Lenis Noon, PharmD 05/28/17 2:03 PM

## 2017-05-28 NOTE — Progress Notes (Signed)
Notified Dr Bridgett Larsson of elevated CBG. No new orders at this time. Give ordered insulin and recheck in one hour.   Bethann Punches, RN

## 2017-05-28 NOTE — Care Management (Addendum)
Patient states she is on nocturnal O2 at home. Patient was independently walking to bathroom without difficulty/without O2 when I rounded. No RNCM needs at this time.

## 2017-05-28 NOTE — Progress Notes (Signed)
Order for enoxaparin 40 mg subcutaneously daily was changed to enoxaparin 40 mg BID given BMI > 40 and CrCl > 30 mL/min. Per protocol.  Lenis Noon, PharmD 05/28/17 7:36 AM

## 2017-05-28 NOTE — Progress Notes (Addendum)
[facility] [place] NAME: [name]    MR#:  [id number]  DATE OF BIRTH:  08/14/1966  SUBJECTIVE:  CHIEF COMPLAINT:   Chief Complaint  Patient presents with  . Chest Pain   Cough and mild shortness breath. REVIEW OF SYSTEMS:  Review of Systems  Constitutional: Negative for chills, fever and malaise/fatigue.  HENT: Negative for sore throat.   Eyes: Negative for blurred vision and double vision.  Respiratory: Positive for cough and shortness of breath. Negative for hemoptysis, wheezing and stridor.   Cardiovascular: Negative for chest pain, palpitations, orthopnea and leg swelling.  Gastrointestinal: Negative for abdominal pain, blood in stool, diarrhea, melena, nausea and vomiting.  Genitourinary: Negative for dysuria, flank pain and hematuria.  Musculoskeletal: Negative for back pain and joint pain.  Skin: Negative for rash.  Neurological: Negative for dizziness, sensory change, focal weakness, seizures, loss of consciousness, weakness and headaches.  Endo/Heme/Allergies: Negative for polydipsia.  Psychiatric/Behavioral: Negative for depression. The patient is not nervous/anxious.     DRUG ALLERGIES:   Allergies  Allergen Reactions  . Morphine And Related     anaphylaxis  . Codeine     REACTION: itch, rash  . Codeine     itching  . Hydrocodone-Acetaminophen     REACTION: rash  . Mirtazapine     REACTION: closes throat  . Morphine And Related   . Sulfamethoxazole-Trimethoprim     REACTION: Whelps all over  . Vicodin [Hydrocodone-Acetaminophen]     itching   VITALS:  Blood pressure 131/71, pulse (!) 105, temperature 99.5 F (37.5 C), temperature source Oral, resp. rate 12, height 4' 11"  (1.499 m), weight 201 lb 8 oz (91.4 kg), SpO2 95 %. PHYSICAL EXAMINATION:  Physical Exam  Constitutional: She is oriented to person, place, and time and well-developed, well-nourished, and in no distress.  HENT:  Head:  Normocephalic.  Mouth/Throat: Oropharynx is clear and moist.  Eyes: Pupils are equal, round, and reactive to light. Conjunctivae and EOM are normal. No scleral icterus.  Neck: Normal range of motion. Neck supple. No JVD present. No tracheal deviation present.  Cardiovascular: Normal rate, regular rhythm and normal heart sounds.  Exam reveals no gallop.   No murmur heard. Pulmonary/Chest: Effort normal and breath sounds normal. No respiratory distress. She has no wheezes. She has no rales.  Abdominal: Soft. Bowel sounds are normal. She exhibits no distension. There is no tenderness. There is no rebound.  Musculoskeletal: Normal range of motion. She exhibits no edema or tenderness.  Neurological: She is alert and oriented to person, place, and time. No cranial nerve deficit.  Skin: No rash noted. No erythema.  Psychiatric: Affect normal.   LABORATORY PANEL:  Female CBC  Recent Labs Lab 05/28/17 0310  WBC 6.3  HGB 13.3  HCT 39.2  PLT 182   ------------------------------------------------------------------------------------------------------------------ Chemistries   Recent Labs Lab 05/28/17 0310  NA 137  K 4.3  CL 99*  CO2 27  GLUCOSE 203*  BUN 13  CREATININE 0.96  CALCIUM 9.0  AST 30  ALT 31  ALKPHOS 82  BILITOT 0.8   RADIOLOGY:  No results found. ASSESSMENT AND PLAN:   CAP, multifocal pneumonia. Continue Zithromax and Rocephin, follow-up cultures, nebulizer when necessary.  Hyperglycemia with pain controlled diabetes 2. BS 599. continue humlin R to 60 units 3 times a day before meals and resistant sliding scale. Follow up diabetes coordinator.  COPD. Stable. NEB when necessary, discontinue IV steroids due to hyperglycemia. CAD.  Continue aspirin and Lipitor.  All the records are reviewed and case discussed with Care Management/Social Worker. Management plans discussed with the patient, family and they are in agreement.  CODE STATUS: Full Code  TOTAL TIME  TAKING CARE OF THIS PATIENT: 37 minutes.   More than 50% of the time was spent in counseling/coordination of care: YES  POSSIBLE D/C IN 2 DAYS, DEPENDING ON CLINICAL CONDITION.   Demetrios Loll M.D on 05/28/2017 at 1:42 PM  Between 7am to 6pm - Pager - [phone]  After 6pm go to [email or web address] - [facility]

## 2017-05-29 DIAGNOSIS — R0602 Shortness of breath: Secondary | ICD-10-CM | POA: Diagnosis not present

## 2017-05-29 DIAGNOSIS — J189 Pneumonia, unspecified organism: Secondary | ICD-10-CM | POA: Diagnosis not present

## 2017-05-29 LAB — GLUCOSE, CAPILLARY
Glucose-Capillary: 241 mg/dL — ABNORMAL HIGH (ref 65–99)
Glucose-Capillary: 175 mg/dL — ABNORMAL HIGH (ref 65–99)

## 2017-05-29 LAB — HIV ANTIBODY (ROUTINE TESTING W REFLEX): HIV SCREEN 4TH GENERATION: NONREACTIVE

## 2017-05-29 MED ORDER — LEVOFLOXACIN 750 MG PO TABS: 750.0000 mg | ORAL_TABLET | Freq: Every day | ORAL | 0 refills | Status: DC

## 2017-05-29 MED ORDER — LEVOFLOXACIN 500 MG PO TABS
750.0000 mg | ORAL_TABLET | Freq: Every day | ORAL | Status: DC
  Administered 2017-05-29: 750 mg via ORAL
  Filled 2017-05-29: qty 2

## 2017-05-29 MED ORDER — GUAIFENESIN-DM 100-10 MG/5ML PO SYRP: 5.0000 mL | ORAL_SOLUTION | ORAL | 0 refills | Status: DC | PRN

## 2017-05-29 NOTE — Progress Notes (Signed)
Patient is alert and oriented and able to verbalize needs. No complaints of pain at this time. VSS. PIV removed. Discharge instructions gone over with patient at this time. Rxs given to patient. No concerns voiced at this time. All belongings packed up. Daughter will pick up patient this afternoon to take home.   Bethann Punches, RN

## 2017-05-29 NOTE — Discharge Summary (Signed)
[facility] NAME: [name]    MR#:  [id number]  DATE OF BIRTH:  December 02, 1965  DATE OF ADMISSION:  05/27/2017   ADMITTING PHYSICIAN: Idelle Crouch, MD  DATE OF DISCHARGE: 05/29/2017 PRIMARY CARE PHYSICIAN: Arnetha Courser, MD   ADMISSION DIAGNOSIS:  Pneumonia, unspecified organism [J18.9] DISCHARGE DIAGNOSIS:  Principal Problem:   Multifocal pneumonia Active Problems:   HYPERTENSION, BENIGN ESSENTIAL   Type 2 diabetes, uncontrolled, with neuropathy ([place])   Pleuritic chest pain  SECONDARY DIAGNOSIS:   Past Medical History:  Diagnosis Date  . Asthma   . Bipolar depression ([place])   . CHF (congestive heart failure) ([place])   . Chronic headaches   . COPD (chronic obstructive pulmonary disease) ([place])   . Diabetic neuropathy ([place])   . GERD (gastroesophageal reflux disease)   . Headache   . Heart attack ([place])   . Hyperlipidemia   . Hypertension   . Hypomagnesemia   . Hypothyroid   . Irregular heart rhythm   . Liver lesion    Favored to be benign on CT; MRI of abd w/contract in Aug 2016. Liver Biopsy-Negative, March 2016  . Morbid obesity ([place])   . OSA (obstructive sleep apnea)   . Pinched nerve    Back  . RLS (restless legs syndrome)   . Seasonal allergies   . Sleep apnea   . Type 2 diabetes, uncontrolled, with neuropathy ([place]) 10/15/2015   Overview:  Microalbumin 7.7 12/2010: Hgb A1c 10.2% 12/2010 and 9.7% 03/2011, eye exam 10/2009 [facility]   . Urinary incontinence   . Vitamin D deficiency    HOSPITAL COURSE:    CAP, multifocal pneumonia. She has been treated with Zithromax and Rocephin, nebulizer when necessary. Blood cultures are negative so far.  Hyperglycemia with pain controlled diabetes 2. Better controlled. continue humlin R to 60 units 3 times a day before meals and resistant sliding scale.  COPD. Stable. NEB when necessary, discontinued IV steroids due to hyperglycemia. CAD. Continue aspirin  and Lipitor. Morbid obesity.  DISCHARGE CONDITIONS:  Stable, discharge to home. CONSULTS OBTAINED:   DRUG ALLERGIES:   Allergies  Allergen Reactions  . Morphine And Related     anaphylaxis  . Codeine     REACTION: itch, rash  . Codeine     itching  . Hydrocodone-Acetaminophen     REACTION: rash  . Mirtazapine     REACTION: closes throat  . Morphine And Related   . Sulfamethoxazole-Trimethoprim     REACTION: [place] all over  . Vicodin [Hydrocodone-Acetaminophen]     itching   DISCHARGE MEDICATIONS:   Allergies as of 05/29/2017      Reactions   Morphine And Related    anaphylaxis   Codeine    REACTION: itch, rash   Codeine    itching   Hydrocodone-acetaminophen    REACTION: rash   Mirtazapine    REACTION: closes throat   Morphine And Related    Sulfamethoxazole-trimethoprim    REACTION: [place]ps all over   Vicodin [hydrocodone-acetaminophen]    itching      Medication List    TAKE these medications   ACCU-CHEK AVIVA PLUS test strip Generic drug:  glucose blood Check FSBS three times a day; LON 99 months, DX E11.40   ACCU-CHEK SOFTCLIX LANCETS lancets Check FSBS three times a day; LON 99 months, DX E11.40   albuterol 108 (90 Base) MCG/ACT inhaler Commonly known as:  PROVENTIL HFA;VENTOLIN HFA Inhale  2 puffs into the lungs every 4 (four) hours as needed for wheezing or shortness of breath.   amLODipine 2.5 MG tablet Commonly known as:  NORVASC Take 1 tablet (2.5 mg total) by mouth daily.   aspirin EC 81 MG tablet Take 1 tablet (81 mg total) by mouth daily.   atorvastatin 40 MG tablet Commonly known as:  LIPITOR Take 1 tablet (40 mg total) by mouth at bedtime.   clobetasol cream 0.05 % Commonly known as:  TEMOVATE Apply 1 application topically 2 (two) times daily. If needed; too strong for face, underarms, groin, children   DULoxetine 60 MG capsule Commonly known as:  CYMBALTA Take 120 mg by mouth daily.   ezetimibe 10 MG tablet Commonly known  as:  ZETIA Take 10 mg by mouth daily.   fluticasone 50 MCG/ACT nasal spray Commonly known as:  FLONASE Place 2 sprays into both nostrils daily.   gabapentin 300 MG capsule Commonly known as:  NEURONTIN Take 1 capsule (300 mg total) by mouth 2 (two) times daily.   guaiFENesin-dextromethorphan 100-10 MG/5ML syrup Commonly known as:  ROBITUSSIN DM Take 5 mLs by mouth every 4 (four) hours as needed for cough.   HUMULIN R U-500 KWIKPEN 500 UNIT/ML kwikpen Generic drug:  insulin regular human CONCENTRATED 60 Units 3 (three) times daily with meals.   Icosapent Ethyl 1 g Caps Commonly known as:  VASCEPA Two pills by mouth twice a day for cholesterol (this replaces Lovaza)   insulin aspart 100 UNIT/ML injection Commonly known as:  novoLOG Short-acting insulin TID ac as follows: 70-150=30units 151-200=35units 201-250=40units 251-300=45units 301-350=50units 351-400=55units 401-450=60units What changed:  how much to take  how to take this  when to take this  additional instructions   Insulin Syringe-Needle U-100 31G X 1/4" 0.3 ML Misc Commonly known as:  ULTICARE INSULIN SYRINGE Inject 1 each into the skin 5 (five) times daily as needed.   JARDIANCE 25 MG Tabs tablet Generic drug:  empagliflozin Take 25 mg by mouth daily.   lamoTRIgine 150 MG tablet Commonly known as:  LAMICTAL Take 150 mg by mouth daily.   levofloxacin 750 MG tablet Commonly known as:  LEVAQUIN Take 1 tablet (750 mg total) by mouth daily.   levothyroxine 75 MCG tablet Commonly known as:  SYNTHROID, LEVOTHROID Take 1 tablet (75 mcg total) by mouth daily.   linaclotide 72 MCG capsule Commonly known as:  LINZESS Take 1 capsule (72 mcg total) by mouth daily before breakfast.   magnesium oxide 400 MG tablet Commonly known as:  MAG-OX Take 1 tablet (400 mg total) by mouth 3 (three) times daily.   metoprolol tartrate 25 MG tablet Commonly known as:  LOPRESSOR Take 0.5 tablets (12.5 mg total) by mouth 2  (two) times daily.   multivitamin capsule Take 1 capsule by mouth daily.   ondansetron 4 MG disintegrating tablet Commonly known as:  ZOFRAN ODT Take 1 tablet (4 mg total) by mouth every 8 (eight) hours as needed for nausea or vomiting.   oxyCODONE-acetaminophen 5-325 MG tablet Commonly known as:  ROXICET Take 1 tablet by mouth every 8 (eight) hours as needed for severe pain.   pantoprazole 40 MG tablet Commonly known as:  PROTONIX Take 1 tablet (40 mg total) by mouth daily.   QUEtiapine 25 MG tablet Commonly known as:  SEROQUEL Take 25 mg by mouth 2 (two) times daily.   QUEtiapine 400 MG tablet Commonly known as:  SEROQUEL Take 400 mg by mouth at bedtime.   ranitidine  300 MG tablet Commonly known as:  ZANTAC Take 1 tablet (300 mg total) by mouth at bedtime.   SYMBICORT 160-4.5 MCG/ACT inhaler Generic drug:  budesonide-formoterol Inhale 2 puffs into the lungs as needed.   zolpidem 10 MG tablet Commonly known as:  AMBIEN Take 10 mg by mouth at bedtime.            Discharge Care Instructions        Start     Ordered   05/30/17 0000  levofloxacin (LEVAQUIN) 750 MG tablet  Daily     05/29/17 1127   05/29/17 0000  Increase activity slowly     05/29/17 1050   05/29/17 0000  Diet - low sodium heart healthy     05/29/17 1050   05/29/17 0000  guaiFENesin-dextromethorphan (ROBITUSSIN DM) 100-10 MG/5ML syrup  Every 4 hours PRN     05/29/17 1132       DISCHARGE INSTRUCTIONS:  See AVS.  If you experience worsening of your admission symptoms, develop shortness of breath, life threatening emergency, suicidal or homicidal thoughts you must seek medical attention immediately by calling 911 or calling your MD immediately  if symptoms less severe.  You Must read complete instructions/literature along with all the possible adverse reactions/side effects for all the Medicines you take and that have been prescribed to you. Take any new Medicines after you have completely  understood and accpet all the possible adverse reactions/side effects.   Please note  You were cared for by a hospitalist during your hospital stay. If you have any questions about your discharge medications or the care you received while you were in the hospital after you are discharged, you can call the unit and asked to speak with the hospitalist on call if the hospitalist that took care of you is not available. Once you are discharged, your primary care physician will handle any further medical issues. Please note that NO REFILLS for any discharge medications will be authorized once you are discharged, as it is imperative that you return to your primary care physician (or establish a relationship with a primary care physician if you do not have one) for your aftercare needs so that they can reassess your need for medications and monitor your lab values.    On the day of Discharge:  VITAL SIGNS:  Blood pressure 135/70, pulse (!) 101, temperature 98.7 F (37.1 C), temperature source Oral, resp. rate 12, height 4' 11"  (1.499 m), weight 201 lb 8 oz (91.4 kg), SpO2 97 %. PHYSICAL EXAMINATION:  GENERAL:  51 y.o.-year-old patient lying in the bed with no acute distress. Morbid obesity. EYES: Pupils equal, round, reactive to light and accommodation. No scleral icterus. Extraocular muscles intact.  HEENT: Head atraumatic, normocephalic. Oropharynx and nasopharynx clear.  NECK:  Supple, no jugular venous distention. No thyroid enlargement, no tenderness.  LUNGS: Normal breath sounds bilaterally, no wheezing, rales,rhonchi or crepitation. No use of accessory muscles of respiration.  CARDIOVASCULAR: S1, S2 normal. No murmurs, rubs, or gallops.  ABDOMEN: Soft, non-tender, non-distended. Bowel sounds present. No organomegaly or mass.  EXTREMITIES: No pedal edema, cyanosis, or clubbing.  NEUROLOGIC: Cranial nerves II through XII are intact. Muscle strength 5/5 in all extremities. Sensation intact. Gait not  checked.  PSYCHIATRIC: The patient is alert and oriented x 3.  SKIN: No obvious rash, lesion, or ulcer.  DATA REVIEW:   CBC  Recent Labs Lab 05/28/17 0310  WBC 6.3  HGB 13.3  HCT 39.2  PLT 182  Chemistries   Recent Labs Lab 05/28/17 0310  NA 137  K 4.3  CL 99*  CO2 27  GLUCOSE 203*  BUN 13  CREATININE 0.96  CALCIUM 9.0  AST 30  ALT 31  ALKPHOS 82  BILITOT 0.8     Microbiology Results  Results for orders placed or performed during the hospital encounter of 05/27/17  Culture, blood (routine x 2)     Status: None (Preliminary result)   Collection Time: 05/27/17  3:46 PM  Result Value Ref Range Status   Specimen Description BLOOD LEFT ANTECUBITAL  Final   Special Requests   Final    BOTTLES DRAWN AEROBIC AND ANAEROBIC Blood Culture results may not be optimal due to an excessive volume of blood received in culture bottles   Culture NO GROWTH < 24 HOURS  Final   Report Status PENDING  Incomplete  Culture, blood (routine x 2)     Status: None (Preliminary result)   Collection Time: 05/27/17  3:46 PM  Result Value Ref Range Status   Specimen Description BLOOD BLOOD RIGHT HAND  Final   Special Requests   Final    BOTTLES DRAWN AEROBIC AND ANAEROBIC Blood Culture adequate volume   Culture NO GROWTH < 24 HOURS  Final   Report Status PENDING  Incomplete    RADIOLOGY:  No results found.   Management plans discussed with the patient, family and they are in agreement.  CODE STATUS: Full Code   TOTAL TIME TAKING CARE OF THIS PATIENT: 32 minutes.    Demetrios Loll M.D on 05/29/2017 at 1:29 PM  Between 7am to 6pm - Pager - [phone]  After 6pm go to [email or web address] - Proofreader  [facility]  Office  [phone]  CC: Primary care physician; Arnetha Courser, MD   Note: This dictation was prepared with Dragon dictation along with smaller phrase technology. Any transcriptional errors that result from this process are  unintentional.

## 2017-05-30 ENCOUNTER — Telehealth: Payer: Self-pay

## 2017-06-01 LAB — CULTURE, BLOOD (ROUTINE X 2)
CULTURE: NO GROWTH
Culture: NO GROWTH
Special Requests: ADEQUATE

## 2017-06-01 NOTE — Telephone Encounter (Signed)
I have called patient three times and left voicemails requesting a CB to complete a TCM call. Pt has not returned my call. However, she has a hospital f/u scheduled for 06/08/17. FYI! -MM

## 2017-06-05 ENCOUNTER — Ambulatory Visit
Admission: RE | Admit: 2017-06-05 | Discharge: 2017-06-05 | Disposition: A | Discharge status: Home / Self Care | Payer: Medicare HMO | Source: Ambulatory Visit | Attending: Family Medicine | Admitting: Family Medicine

## 2017-06-05 ENCOUNTER — Ambulatory Visit (INDEPENDENT_AMBULATORY_CARE_PROVIDER_SITE_OTHER): Payer: Medicare HMO | Admitting: Family Medicine

## 2017-06-05 ENCOUNTER — Encounter: Payer: Self-pay | Admitting: Family Medicine

## 2017-06-05 VITALS — BP 122/78 | HR 89 | Temp 98.5°F | Resp 16 | Wt 195.9 lb

## 2017-06-05 DIAGNOSIS — IMO0002 Reserved for concepts with insufficient information to code with codable children: Secondary | ICD-10-CM

## 2017-06-05 DIAGNOSIS — N183 Chronic kidney disease, stage 3 unspecified: Secondary | ICD-10-CM

## 2017-06-05 DIAGNOSIS — Z1231 Encounter for screening mammogram for malignant neoplasm of breast: Secondary | ICD-10-CM

## 2017-06-05 DIAGNOSIS — J181 Lobar pneumonia, unspecified organism: Secondary | ICD-10-CM | POA: Diagnosis not present

## 2017-06-05 DIAGNOSIS — E1140 Type 2 diabetes mellitus with diabetic neuropathy, unspecified: Secondary | ICD-10-CM

## 2017-06-05 DIAGNOSIS — Z8701 Personal history of pneumonia (recurrent): Secondary | ICD-10-CM | POA: Insufficient documentation

## 2017-06-05 DIAGNOSIS — J449 Chronic obstructive pulmonary disease, unspecified: Secondary | ICD-10-CM

## 2017-06-05 DIAGNOSIS — Z1239 Encounter for other screening for malignant neoplasm of breast: Secondary | ICD-10-CM

## 2017-06-05 DIAGNOSIS — E1165 Type 2 diabetes mellitus with hyperglycemia: Secondary | ICD-10-CM

## 2017-06-05 MED ORDER — ALBUTEROL SULFATE HFA 108 (90 BASE) MCG/ACT IN AERS: 2.0000 | INHALATION_SPRAY | RESPIRATORY_TRACT | 1 refills | Status: DC | PRN

## 2017-06-05 MED ORDER — FLUCONAZOLE 150 MG PO TABS: 150.0000 mg | ORAL_TABLET | Freq: Once | ORAL | 0 refills | Status: AC

## 2017-06-05 MED ORDER — BUDESONIDE-FORMOTEROL FUMARATE 160-4.5 MCG/ACT IN AERO: 2.0000 | INHALATION_SPRAY | Freq: Two times a day (BID) | RESPIRATORY_TRACT | 11 refills | Status: DC

## 2017-06-05 NOTE — Patient Instructions (Addendum)
Use the fluconazole for the yeast infection Do not take your cholesterol medicine for 3 days Please have the xray done across the street Community-Acquired Pneumonia, Adult Pneumonia is an infection of the lungs. There are different types of pneumonia. One type can develop while a person is in a hospital. A different type, called community-acquired pneumonia, develops in people who are not, or have not recently been, in the hospital or other health care facility. What are the causes? Pneumonia may be caused by bacteria, viruses, or funguses. Community-acquired pneumonia is often caused by Streptococcus pneumonia bacteria. These bacteria are often passed from one person to another by breathing in droplets from the cough or sneeze of an infected person. What increases the risk? The condition is more likely to develop in:  People who havechronic diseases, such as chronic obstructive pulmonary disease (COPD), asthma, congestive heart failure, cystic fibrosis, diabetes, or kidney disease.  People who haveearly-stage or late-stage HIV.  People who havesickle cell disease.  People who havehad their spleen removed (splenectomy).  People who havepoor Human resources officer.  People who havemedical conditions that increase the risk of breathing in (aspirating) secretions their own mouth and nose.  People who havea weakened immune system (immunocompromised).  People who smoke.  People whotravel to areas where pneumonia-causing germs commonly exist.  People whoare around animal habitats or animals that have pneumonia-causing germs, including birds, bats, rabbits, cats, and farm animals.  What are the signs or symptoms? Symptoms of this condition include:  Adry cough.  A wet (productive) cough.  Fever.  Sweating.  Chest pain, especially when breathing deeply or coughing.  Rapid breathing or difficulty breathing.  Shortness of breath.  Shaking chills.  Fatigue.  Muscle  aches.  How is this diagnosed? Your health care provider will take a medical history and perform a physical exam. You may also have other tests, including:  Imaging studies of your chest, including X-rays.  Tests to check your blood oxygen level and other blood gases.  Other tests on blood, mucus (sputum), fluid around your lungs (pleural fluid), and urine.  If your pneumonia is severe, other tests may be done to identify the specific cause of your illness. How is this treated? The type of treatment that you receive depends on many factors, such as the cause of your pneumonia, the medicines you take, and other medical conditions that you have. For most adults, treatment and recovery from pneumonia may occur at home. In some cases, treatment must happen in a hospital. Treatment may include:  Antibiotic medicines, if the pneumonia was caused by bacteria.  Antiviral medicines, if the pneumonia was caused by a virus.  Medicines that are given by mouth or through an IV tube.  Oxygen.  Respiratory therapy.  Although rare, treating severe pneumonia may include:  Mechanical ventilation. This is done if you are not breathing well on your own and you cannot maintain a safe blood oxygen level.  Thoracentesis. This procedureremoves fluid around one lung or both lungs to help you breathe better.  Follow these instructions at home:  Take over-the-counter and prescription medicines only as told by your health care provider. ? Only takecough medicine if you are losing sleep. Understand that cough medicine can prevent your body's natural ability to remove mucus from your lungs. ? If you were prescribed an antibiotic medicine, take it as told by your health care provider. Do not stop taking the antibiotic even if you start to feel better.  Sleep in a semi-upright position at  night. Try sleeping in a reclining chair, or place a few pillows under your head.  Do not use tobacco products,  including cigarettes, chewing tobacco, and e-cigarettes. If you need help quitting, ask your health care provider.  Drink enough water to keep your urine clear or pale yellow. This will help to thin out mucus secretions in your lungs. How is this prevented? There are ways that you can decrease your risk of developing community-acquired pneumonia. Consider getting a pneumococcal vaccine if:  You are older than 53 years of age.  You are older than 53 years of age and are undergoing cancer treatment, have chronic lung disease, or have other medical conditions that affect your immune system. Ask your health care provider if this applies to you.  There are different types and schedules of pneumococcal vaccines. Ask your health care provider which vaccination option is best for you. You may also prevent community-acquired pneumonia if you take these actions:  Get an influenza vaccine every year. Ask your health care provider which type of influenza vaccine is best for you.  Go to the dentist on a regular basis.  Wash your hands often. Use hand sanitizer if soap and water are not available.  Contact a health care provider if:  You have a fever.  You are losing sleep because you cannot control your cough with cough medicine. Get help right away if:  You have worsening shortness of breath.  You have increased chest pain.  Your sickness becomes worse, especially if you are an older adult or have a weakened immune system.  You cough up blood. This information is not intended to replace advice given to you by your health care provider. Make sure you discuss any questions you have with your health care provider. Document Released: 08/21/2005 Document Revised: 12/30/2015 Document Reviewed: 12/16/2014 Elsevier Interactive Patient Education  2017 [employer].

## 2017-06-05 NOTE — Assessment & Plan Note (Signed)
Adjust meds for CKD

## 2017-06-05 NOTE — Assessment & Plan Note (Signed)
Continue to f/u with endo; foot exam by MD today

## 2017-06-05 NOTE — Progress Notes (Signed)
BP 122/78 (BP Location: Right Arm)   Pulse 89   Temp 98.5 F (36.9 C) (Oral)   Resp 16   Wt 195 lb 14.4 oz (88.9 kg)   SpO2 93%   BMI 39.57 kg/m    Subjective:    Patient ID: [name], female    DOB: 08-14-1966, 51 y.o.   MRN: [id number]  HPI: [name] is a 51 y.o. female  Chief Complaint  Patient presents with  . Shortness of Breath    back/chest pain, bad cough and was in hospital with pneumonia last week    HPI Patient is here for hospital follow-up She had multifocal pneumonia; admitted to the hospital on Sept 23rd and discharged on Sept 25th She also had pleuritic chest pain She was discharged on 750 mg levaquin Yeast infection from the antibiotics No fevers Coughing up lots of phelgm, greenish color   Imaging reports reviewed Chest xray Sept 23rd: EXAM: CHEST  2 VIEW  COMPARISON:  08/31/2014  FINDINGS: Cardiac shadow is within normal limits. The lungs are well aerated bilaterally. Some patchy changes are noted in the left mid lung consistent with acute infiltrate. No sizable effusion is noted. No pneumothorax is seen. Degenerative changes of the thoracic spine are noted.  IMPRESSION: Left midlung infiltrate.   Electronically Signed   By: Inez Catalina M.D.   On: 05/27/2017 10:01  Chest CT angio Sept 23rd: CLINICAL DATA:  51 year old female with central chest pain, upper abdominal pain, shortness of breath. Nausea.  EXAM: CT ANGIOGRAPHY CHEST WITH CONTRAST  TECHNIQUE: Multidetector CT imaging of the chest was performed using the standard protocol during bolus administration of intravenous contrast. Multiplanar CT image reconstructions and MIPs were obtained to evaluate the vascular anatomy.  CONTRAST:  75 mL Isovue 370  COMPARISON:  Chest radiographs 0929 hours today. Chest CT 01/02/2012.  FINDINGS: Cardiovascular: Adequate contrast bolus timing in the pulmonary arterial tree.  No focal filling defect identified  in the pulmonary arteries to suggest acute pulmonary embolism.  Negative visualized aorta. There is evidence of some calcified coronary artery atherosclerosis. No pericardial effusion.  Mediastinum/Nodes: Small left hilar and prevascular lymph nodes may be mildly increased since 2013, such as reactive. Other mediastinal and right hilar nodes are normal.  Lungs/Pleura: Consolidation in the posterior inferior aspect of the left upper lobe adjacent to the hilum with extension into the lingula. No left pleural effusion. The consolidation extends along the major fissure but the left lower lobe is spared.  Major airways are patent. There is mild nonspecific peribronchial ground-glass opacity in the right upper lobe (series 6, image 30). No other abnormal right lung opacity. No right pleural effusion.  Upper Abdomen: Surgically absent gallbladder. Possible hepatomegaly, but stable visualized upper abdominal viscera since 2013.  Musculoskeletal: Negative.  Review of the MIP images confirms the above findings.  IMPRESSION: 1.  Negative for acute pulmonary embolus. 2. Left lung multi-lobar pneumonia. No left pleural effusion. Reactive appearing left mediastinal lymph nodes. Followup PA and lateral chest X-ray is recommended in 3-4 weeks following trial of antibiotic therapy to ensure resolution and exclude underlying malignancy. 3. Possible early extension of pneumonia into the right upper lobe. 4. Mild calcified atherosclerosis suspected.   Electronically Signed   By: Genevie Ann M.D.   On: 05/27/2017 11:37  Type 2 diabetes; Dr. Ronnald Collum checked A1c one month ago; reviewed; controlled  She'll get a mammogram  She used to take symbicort but has run out; does not have rescue  inhaler; wheezing sometimes  Depression screen [facility] 2/9 06/05/2017 04/11/2017 03/21/2017 02/12/2017 02/02/2017  Decreased Interest 0 0 0 0 0  Down, Depressed, Hopeless 0 0 0 0 0  PHQ - 2 Score 0 0 0 0 0    Some recent data might be hidden   Relevant past medical, surgical, family and social history reviewed Past Medical History:  Diagnosis Date  . Asthma   . Bipolar depression ([place])   . CHF (congestive heart failure) ([place])   . Chronic headaches   . COPD (chronic obstructive pulmonary disease) ([place])   . Diabetic neuropathy ([place])   . GERD (gastroesophageal reflux disease)   . Headache   . Heart attack ([place])   . Hyperlipidemia   . Hypertension   . Hypomagnesemia   . Hypothyroid   . Irregular heart rhythm   . Liver lesion    Favored to be benign on CT; MRI of abd w/contract in Aug 2016. Liver Biopsy-Negative, March 2016  . Morbid obesity ([place])   . OSA (obstructive sleep apnea)   . Pinched nerve    Back  . Pneumonia   . RLS (restless legs syndrome)   . Seasonal allergies   . Sleep apnea   . Type 2 diabetes, uncontrolled, with neuropathy ([place]) 10/15/2015   Overview:  Microalbumin 7.7 12/2010: Hgb A1c 10.2% 12/2010 and 9.7% 03/2011, eye exam 10/2009 [facility]   . Urinary incontinence   . Vitamin D deficiency    Past Surgical History:  Procedure Laterality Date  . ABDOMINAL HYSTERECTOMY    . ABDOMINAL HYSTERECTOMY     Partial  . BLADDER SURGERY     x 2  . CHOLECYSTECTOMY    . LIVER BIOPSY  March 2016   Negative  . LUNG BIOPSY    . TUBAL LIGATION     Family History  Problem Relation Age of Onset  . Diabetes Mother   . Heart disease Mother   . Hyperlipidemia Mother   . Hypertension Mother   . Kidney disease Mother   . Mental illness Mother   . Hypothyroidism Mother   . Stroke Mother   . Osteoporosis Mother   . Glaucoma Mother   . Congestive Heart Failure Mother   . Hypertension Father   . Asthma Daughter   . Cancer Daughter        throat  . Bipolar disorder Daughter   . Heart disease Maternal Grandfather   . Rheum arthritis Maternal Grandfather   . Cancer Maternal Grandfather        liver  . Kidney disease Maternal Grandfather   . Cancer Paternal Grandmother         lung  . Kidney disease Brother   . Bipolar disorder Daughter   . Hypertension Daughter   . Restless legs syndrome Daughter    Social History   Social History  . Marital status: Married    Spouse name: N/A  . Number of children: 2  . Years of education: 12   Occupational History  . disabled    Social History Main Topics  . Smoking status: Never Smoker  . Smokeless tobacco: Never Used  . Alcohol use No  . Drug use: No  . Sexual activity: Yes   Other Topics Concern  . Not on file   Social History Narrative   ** Merged History Encounter **       16-20 oz of caffeine a day     Interim medical history since last visit reviewed. Allergies  and medications reviewed  Review of Systems Per HPI unless specifically indicated above     Objective:    BP 122/78 (BP Location: Right Arm)   Pulse 89   Temp 98.5 F (36.9 C) (Oral)   Resp 16   Wt 195 lb 14.4 oz (88.9 kg)   SpO2 93%   BMI 39.57 kg/m   Wt Readings from Last 3 Encounters:  06/05/17 195 lb 14.4 oz (88.9 kg)  05/28/17 201 lb 8 oz (91.4 kg)  04/18/17 200 lb (90.7 kg)    Physical Exam  Constitutional: She appears well-developed and well-nourished. No distress.  HENT:  Right Ear: Tympanic membrane and ear canal normal.  Left Ear: Tympanic membrane and ear canal normal.  Nose: Rhinorrhea (clear) present.  Mouth/Throat: Oropharynx is clear and moist and mucous membranes are normal.  Eyes: EOM are normal. No scleral icterus.  Cardiovascular: Normal rate and regular rhythm.   Pulmonary/Chest: Effort normal. No accessory muscle usage. No tachypnea. No respiratory distress. She has decreased breath sounds in the right upper field and the left middle field. She has no wheezes.  Skin: She is not diaphoretic.  Psychiatric: She has a normal mood and affect. Her behavior is normal.   Diabetic Foot Form - Detailed   Diabetic Foot Exam - detailed Diabetic Foot exam was performed with the following findings:  Yes  06/05/2017  2:48 PM  Visual Foot Exam completed.:  Yes  Pulse Foot Exam completed.:  Yes  Right Dorsalis Pedis:  Present Left Dorsalis Pedis:  Present  Sensory Foot Exam Completed.:  Yes Semmes-Weinstein Monofilament Test R Site 1-Great Toe:  Pos L Site 1-Great Toe:  Pos       Results for orders placed or performed during the hospital encounter of 05/27/17  Culture, blood (routine x 2)  Result Value Ref Range   Specimen Description BLOOD LEFT ANTECUBITAL    Special Requests      BOTTLES DRAWN AEROBIC AND ANAEROBIC Blood Culture results may not be optimal due to an excessive volume of blood received in culture bottles   Culture NO GROWTH 5 DAYS    Report Status 06/01/2017 FINAL   Culture, blood (routine x 2)  Result Value Ref Range   Specimen Description BLOOD BLOOD RIGHT HAND    Special Requests      BOTTLES DRAWN AEROBIC AND ANAEROBIC Blood Culture adequate volume   Culture NO GROWTH 5 DAYS    Report Status 06/01/2017 FINAL   Basic metabolic panel  Result Value Ref Range   Sodium 136 135 - 145 mmol/L   Potassium 4.2 3.5 - 5.1 mmol/L   Chloride 100 (L) 101 - 111 mmol/L   CO2 23 22 - 32 mmol/L   Glucose, Bld 395 (H) 65 - 99 mg/dL   BUN 9 6 - 20 mg/dL   Creatinine, Ser 1.01 (H) 0.44 - 1.00 mg/dL   Calcium 8.9 8.9 - 10.3 mg/dL   GFR calc non Af Amer >60 >60 mL/min   GFR calc Af Amer >60 >60 mL/min   Anion gap 13 5 - 15  CBC  Result Value Ref Range   WBC 7.1 3.6 - 11.0 K/uL   RBC 4.50 3.80 - 5.20 MIL/uL   Hemoglobin 13.2 12.0 - 16.0 g/dL   HCT 39.0 35.0 - 47.0 %   MCV 86.7 80.0 - 100.0 fL   MCH 29.4 26.0 - 34.0 pg   MCHC 33.9 32.0 - 36.0 g/dL   RDW 14.0 11.5 - 14.5 %  Platelets 179 150 - 440 K/uL  Troponin I  Result Value Ref Range   Troponin I <0.03 <0.03 ng/mL  Troponin I  Result Value Ref Range   Troponin I <0.03 <0.03 ng/mL  Comprehensive metabolic panel  Result Value Ref Range   Sodium 137 135 - 145 mmol/L   Potassium 4.3 3.5 - 5.1 mmol/L   Chloride 99 (L)  101 - 111 mmol/L   CO2 27 22 - 32 mmol/L   Glucose, Bld 203 (H) 65 - 99 mg/dL   BUN 13 6 - 20 mg/dL   Creatinine, Ser 0.96 0.44 - 1.00 mg/dL   Calcium 9.0 8.9 - 10.3 mg/dL   Total Protein 6.5 6.5 - 8.1 g/dL   Albumin 3.7 3.5 - 5.0 g/dL   AST 30 15 - 41 U/L   ALT 31 14 - 54 U/L   Alkaline Phosphatase 82 38 - 126 U/L   Total Bilirubin 0.8 0.3 - 1.2 mg/dL   GFR calc non Af Amer >60 >60 mL/min   GFR calc Af Amer >60 >60 mL/min   Anion gap 11 5 - 15  CBC  Result Value Ref Range   WBC 6.3 3.6 - 11.0 K/uL   RBC 4.53 3.80 - 5.20 MIL/uL   Hemoglobin 13.3 12.0 - 16.0 g/dL   HCT 39.2 35.0 - 47.0 %   MCV 86.4 80.0 - 100.0 fL   MCH 29.3 26.0 - 34.0 pg   MCHC 33.9 32.0 - 36.0 g/dL   RDW 14.3 11.5 - 14.5 %   Platelets 182 150 - 440 K/uL  Glucose, capillary  Result Value Ref Range   Glucose-Capillary 276 (H) 65 - 99 mg/dL  HIV antibody  Result Value Ref Range   HIV Screen 4th Generation wRfx Non Reactive Non Reactive  Glucose, capillary  Result Value Ref Range   Glucose-Capillary 224 (H) 65 - 99 mg/dL  Glucose, capillary  Result Value Ref Range   Glucose-Capillary 349 (H) 65 - 99 mg/dL  Glucose, capillary  Result Value Ref Range   Glucose-Capillary 544 (HH) 65 - 99 mg/dL  Glucose, capillary  Result Value Ref Range   Glucose-Capillary 599 (HH) 65 - 99 mg/dL   Comment 1 Notify RN   Glucose, capillary  Result Value Ref Range   Glucose-Capillary 461 (H) 65 - 99 mg/dL  Glucose, capillary  Result Value Ref Range   Glucose-Capillary 303 (H) 65 - 99 mg/dL  Glucose, capillary  Result Value Ref Range   Glucose-Capillary 241 (H) 65 - 99 mg/dL  Glucose, capillary  Result Value Ref Range   Glucose-Capillary 175 (H) 65 - 99 mg/dL      Assessment & Plan:   Problem List Items Addressed This Visit      Respiratory   Lobar pneumonia ([place]) - Primary    Order follow-up chest xray      Relevant Medications   fluconazole (DIFLUCAN) 150 MG tablet   budesonide-formoterol (SYMBICORT)  160-4.5 MCG/ACT inhaler   albuterol (PROVENTIL HFA;VENTOLIN HFA) 108 (90 Base) MCG/ACT inhaler   Other Relevant Orders   DG Chest 2 View   COPD (chronic obstructive pulmonary disease) (HCC)    Start back on Symbicort      Relevant Medications   budesonide-formoterol (SYMBICORT) 160-4.5 MCG/ACT inhaler   albuterol (PROVENTIL HFA;VENTOLIN HFA) 108 (90 Base) MCG/ACT inhaler     Endocrine   Type 2 diabetes, uncontrolled, with neuropathy (HCC)    Continue to f/u with endo; foot exam by MD today  Genitourinary   Chronic kidney disease, stage III (moderate) (HCC)    Adjust meds for CKD       Other Visit Diagnoses    Screening for breast cancer       Relevant Orders   MM Digital Screening      Follow up plan: No Follow-up on file.  An after-visit summary was printed and given to the patient at [facility].  Please see the patient instructions which may contain other information and recommendations beyond what is mentioned above in the assessment and plan.  Meds ordered this encounter  Medications  . fluconazole (DIFLUCAN) 150 MG tablet    Sig: Take 1 tablet (150 mg total) by mouth once. Do not take your cholesterol medicine for 3 days    Dispense:  1 tablet    Refill:  0  . budesonide-formoterol (SYMBICORT) 160-4.5 MCG/ACT inhaler    Sig: Inhale 2 puffs into the lungs 2 (two) times daily. Every day; rinse mouth out after each use    Dispense:  1 Inhaler    Refill:  11  . albuterol (PROVENTIL HFA;VENTOLIN HFA) 108 (90 Base) MCG/ACT inhaler    Sig: Inhale 2 puffs into the lungs every 4 (four) hours as needed for wheezing or shortness of breath.    Dispense:  1 Inhaler    Refill:  1    Orders Placed This Encounter  Procedures  . MM Digital Screening  . DG Chest 2 View

## 2017-06-05 NOTE — Assessment & Plan Note (Signed)
Order follow-up chest xray

## 2017-06-05 NOTE — Assessment & Plan Note (Signed)
Start back on Symbicort

## 2017-06-06 ENCOUNTER — Emergency Department
Admission: EM | Admit: 2017-06-06 | Discharge: 2017-06-06 | Disposition: A | Discharge status: Home / Self Care | Payer: Medicare HMO | Attending: Emergency Medicine | Admitting: Emergency Medicine

## 2017-06-06 DIAGNOSIS — E1140 Type 2 diabetes mellitus with diabetic neuropathy, unspecified: Secondary | ICD-10-CM | POA: Diagnosis not present

## 2017-06-06 DIAGNOSIS — J45909 Unspecified asthma, uncomplicated: Secondary | ICD-10-CM | POA: Insufficient documentation

## 2017-06-06 DIAGNOSIS — E860 Dehydration: Secondary | ICD-10-CM | POA: Insufficient documentation

## 2017-06-06 DIAGNOSIS — J449 Chronic obstructive pulmonary disease, unspecified: Secondary | ICD-10-CM | POA: Insufficient documentation

## 2017-06-06 DIAGNOSIS — Z7982 Long term (current) use of aspirin: Secondary | ICD-10-CM | POA: Diagnosis not present

## 2017-06-06 DIAGNOSIS — Z79899 Other long term (current) drug therapy: Secondary | ICD-10-CM | POA: Insufficient documentation

## 2017-06-06 DIAGNOSIS — E1165 Type 2 diabetes mellitus with hyperglycemia: Secondary | ICD-10-CM | POA: Insufficient documentation

## 2017-06-06 DIAGNOSIS — E039 Hypothyroidism, unspecified: Secondary | ICD-10-CM | POA: Insufficient documentation

## 2017-06-06 DIAGNOSIS — R739 Hyperglycemia, unspecified: Secondary | ICD-10-CM

## 2017-06-06 DIAGNOSIS — I509 Heart failure, unspecified: Secondary | ICD-10-CM | POA: Insufficient documentation

## 2017-06-06 DIAGNOSIS — I110 Hypertensive heart disease with heart failure: Secondary | ICD-10-CM | POA: Diagnosis not present

## 2017-06-06 DIAGNOSIS — Z794 Long term (current) use of insulin: Secondary | ICD-10-CM | POA: Insufficient documentation

## 2017-06-06 LAB — URINALYSIS, COMPLETE (UACMP) WITH MICROSCOPIC
BACTERIA UA: NONE SEEN
BILIRUBIN URINE: NEGATIVE
Hgb urine dipstick: NEGATIVE
Ketones, ur: NEGATIVE mg/dL
Leukocytes, UA: NEGATIVE
NITRITE: NEGATIVE
PH: 5 (ref 5.0–8.0)
Protein, ur: NEGATIVE mg/dL
SPECIFIC GRAVITY, URINE: 1.024 (ref 1.005–1.030)

## 2017-06-06 LAB — CBC
HEMATOCRIT: 38.6 % (ref 35.0–47.0)
Hemoglobin: 12.6 g/dL (ref 12.0–16.0)
MCH: 28.8 pg (ref 26.0–34.0)
MCHC: 32.6 g/dL (ref 32.0–36.0)
MCV: 88.3 fL (ref 80.0–100.0)
PLATELETS: 279 10*3/uL (ref 150–440)
RBC: 4.37 MIL/uL (ref 3.80–5.20)
RDW: 14.4 % (ref 11.5–14.5)
WBC: 11.3 10*3/uL — ABNORMAL HIGH (ref 3.6–11.0)

## 2017-06-06 LAB — HEPATIC FUNCTION PANEL
ALBUMIN: 3.8 g/dL (ref 3.5–5.0)
ALK PHOS: 72 U/L (ref 38–126)
ALT: 20 U/L (ref 14–54)
AST: 22 U/L (ref 15–41)
Bilirubin, Direct: <0.1 mg/dL — ABNORMAL LOW (ref 0.1–0.5)
TOTAL PROTEIN: 6.5 g/dL (ref 6.5–8.1)
Total Bilirubin: 0.7 mg/dL (ref 0.3–1.2)

## 2017-06-06 LAB — BASIC METABOLIC PANEL
Anion gap: 10 (ref 5–15)
BUN: 20 mg/dL (ref 6–20)
CALCIUM: 9.2 mg/dL (ref 8.9–10.3)
CO2: 25 mmol/L (ref 22–32)
CREATININE: 1.17 mg/dL — AB (ref 0.44–1.00)
Chloride: 97 mmol/L — ABNORMAL LOW (ref 101–111)
GFR calc Af Amer: >60 mL/min (ref >60)
GFR, EST NON AFRICAN AMERICAN: 52 mL/min — AB (ref >60)
GLUCOSE: 525 mg/dL — AB (ref 65–99)
Potassium: 3.8 mmol/L (ref 3.5–5.1)
Sodium: 132 mmol/L — ABNORMAL LOW (ref 135–145)

## 2017-06-06 LAB — GLUCOSE, CAPILLARY
Glucose-Capillary: 523 mg/dL (ref 65–99)
Glucose-Capillary: 92 mg/dL (ref 65–99)

## 2017-06-06 LAB — PREGNANCY, URINE: Preg Test, Ur: NEGATIVE

## 2017-06-06 LAB — LIPASE, BLOOD: Lipase: 30 U/L (ref 11–51)

## 2017-06-06 MED ORDER — SODIUM CHLORIDE 0.9 % IV BOLUS (SEPSIS)
2000.0000 mL | Freq: Once | INTRAVENOUS | Status: AC
  Administered 2017-06-06: 2000 mL via INTRAVENOUS

## 2017-06-06 MED ORDER — INSULIN ASPART 100 UNIT/ML ~~LOC~~ SOLN
10.0000 [IU] | Freq: Once | SUBCUTANEOUS | Status: AC
  Administered 2017-06-06: 10 [IU] via INTRAVENOUS
  Filled 2017-06-06: qty 1

## 2017-06-06 NOTE — ED Provider Notes (Signed)
[facility] Emergency Department Provider Note  ____________________________________________  Time seen: Approximately 4:35 PM  I have reviewed the triage vital signs and the nursing notes.   HISTORY  Chief Complaint Hyperglycemia and Dizziness    HPI [name] is a 51 y.o. female with a history of diabetes and what she reports as insulin resistance who complains of high blood sugars today, frequent urination and excessive thirst. She denies any pain anywhere. Denies shortness of breath. Only has lightheadedness on standing nausea and fatigue. Denies dysuria. No skin rashes fevers chills or sweats.     Past Medical History:  Diagnosis Date  . Asthma   . Bipolar depression ([place])   . CHF (congestive heart failure) ([place])   . Chronic headaches   . COPD (chronic obstructive pulmonary disease) ([place])   . Diabetic neuropathy ([place])   . GERD (gastroesophageal reflux disease)   . Headache   . Heart attack ([place])   . Hyperlipidemia   . Hypertension   . Hypomagnesemia   . Hypothyroid   . Irregular heart rhythm   . Liver lesion    Favored to be benign on CT; MRI of abd w/contract in Aug 2016. Liver Biopsy-Negative, March 2016  . Morbid obesity ([place])   . OSA (obstructive sleep apnea)   . Pinched nerve    Back  . Pneumonia   . RLS (restless legs syndrome)   . Seasonal allergies   . Sleep apnea   . Type 2 diabetes, uncontrolled, with neuropathy ([place]) 10/15/2015   Overview:  Microalbumin 7.7 12/2010: Hgb A1c 10.2% 12/2010 and 9.7% 03/2011, eye exam 10/2009 [facility] DM clinic   . Urinary incontinence   . Vitamin D deficiency      Patient Active Problem List   Diagnosis Date Noted  . Lobar pneumonia ([place]) 06/05/2017  . Multifocal pneumonia 05/27/2017  . Pleuritic chest pain 05/27/2017  . Hypertriglyceridemia 12/21/2016  . Pansinusitis 11/15/2016  . 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 Breast cancer screening 09/28/2016  . Urine  frequency 09/28/2016  . Preventative health care 09/28/2016  . Fatigue 05/01/2016  . Urinary tract infection 03/14/2016  . Adenomatous colon polyp 10/15/2015  . Airway hyperreactivity 10/15/2015  . Bipolar affective disorder ([place]) 10/15/2015  . CN (constipation) 10/15/2015  . CAFL (chronic airflow limitation) ([place]) 10/15/2015  . Drug-induced hepatic toxicity 10/15/2015  . Blood in feces 10/15/2015  . HLD (hyperlipidemia) 10/15/2015  . Hypoxemia 10/15/2015  . NASH (nonalcoholic steatohepatitis) 10/15/2015  . Morbid obesity ([place]) 10/15/2015  . Restless leg syndrome 10/15/2015  . Type 2 diabetes, uncontrolled, with neuropathy ([place]) 10/15/2015  . Elevated alkaline phosphatase level 09/22/2015  . Urinary bladder incontinence 09/22/2015  . Low back pain with left-sided sciatica 08/18/2015  . Encounter for monitoring tricyclic antidepressant therapy 05/13/2015  . Bipolar disorder ([place]) 05/11/2015  . Liver disease 05/11/2015  . Diabetes ([place]) 05/11/2015  . Apnea, sleep 05/05/2015  . Heart valve disease 05/05/2015  . Chronic kidney disease, stage III (moderate) (HCC) 03/15/2015  . Migraine 03/12/2015  . COPD (chronic obstructive pulmonary disease) ([place]) 01/20/2015  . 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 GERD (gastroesophageal reflux disease) 01/20/2015  . Hypothyroid   . Vitamin D deficiency   . Urinary incontinence   . Liver lesion   . Hypomagnesemia   . Obstructive sleep apnea 10/20/2013  . PERIPHERAL NEUROPATHY 03/04/2009  . HEMORRHOIDS, EXTERNAL 09/03/2008  . ALLERGIC RHINITIS 07/04/2007  . ASTHMA, PERSISTENT, MODERATE 07/04/2007  .  INTERSTITIAL CYSTITIS 07/04/2007  . HYPERTENSION, BENIGN ESSENTIAL 06/19/2007     Past Surgical History:  Procedure Laterality Date  . ABDOMINAL HYSTERECTOMY    . ABDOMINAL HYSTERECTOMY     Partial  . BLADDER SURGERY     x 2  . CHOLECYSTECTOMY    . LIVER BIOPSY  March 2016   Negative  . LUNG BIOPSY    . TUBAL LIGATION        Prior to Admission medications   Medication Sig Start Date End Date Taking? Authorizing Provider  albuterol (PROVENTIL HFA;VENTOLIN HFA) 108 (90 Base) MCG/ACT inhaler Inhale 2 puffs into the lungs every 4 (four) hours as needed for wheezing or shortness of breath. 06/05/17  Yes Lada, Satira Anis, MD  amLODipine (NORVASC) 2.5 MG tablet Take 1 tablet (2.5 mg total) by mouth daily. 12/28/16  Yes Lada, Satira Anis, MD  aspirin EC 81 MG tablet Take 1 tablet (81 mg total) by mouth daily. 11/03/16  Yes Lada, Satira Anis, MD  atorvastatin (LIPITOR) 40 MG tablet Take 1 tablet (40 mg total) by mouth at bedtime. 04/13/17  Yes Lada, Satira Anis, MD  budesonide-formoterol (SYMBICORT) 160-4.5 MCG/ACT inhaler Inhale 2 puffs into the lungs 2 (two) times daily. Every day; rinse mouth out after each use 06/05/17  Yes Lada, Satira Anis, MD  clobetasol cream (TEMOVATE) 1.91 % Apply 1 application topically 2 (two) times daily. If needed; too strong for face, underarms, groin, children 02/02/17  Yes Lada, Satira Anis, MD  DULoxetine (CYMBALTA) 60 MG capsule Take 120 mg by mouth daily.   Yes [provider]  ezetimibe (ZETIA) 10 MG tablet Take 10 mg by mouth daily.  01/05/17  Yes [provider]  fluticasone (FLONASE) 50 MCG/ACT nasal spray Place 2 sprays into both nostrils daily. 01/09/17 01/09/18 Yes Lada, Satira Anis, MD  gabapentin (NEURONTIN) 300 MG capsule Take 1 capsule (300 mg total) by mouth 2 (two) times daily. 04/11/17  Yes Lada, Satira Anis, MD  HUMULIN R U-500 KWIKPEN 500 UNIT/ML kwikpen 60 Units 3 (three) times daily with meals.  12/06/16  Yes [provider]  Icosapent Ethyl (VASCEPA) 1 g CAPS Two pills by mouth twice a day for cholesterol (this replaces Lovaza) 09/29/16  Yes Lada, Satira Anis, MD  insulin aspart (NOVOLOG) 100 UNIT/ML injection Short-acting insulin TID ac as follows: 70-150=30units 151-200=35units 201-250=40units 251-300=45units 301-350=50units 351-400=55units 401-450=60units Patient  taking differently: Inject 30-60 Units into the skin 3 (three) times daily with meals. Short-acting insulin TID ac as follows: 70-150=30units 151-200=35units 201-250=40units 251-300=45units 301-350=50units 351-400=55units 401-450=60units 09/29/16  Yes Lada, Satira Anis, MD  JARDIANCE 25 MG TABS tablet Take 25 mg by mouth daily. 02/26/17  Yes [provider]  lamoTRIgine (LAMICTAL) 150 MG tablet Take 150 mg by mouth daily.  03/22/15  Yes [provider]  levothyroxine (SYNTHROID, LEVOTHROID) 75 MCG tablet Take 1 tablet (75 mcg total) by mouth daily. 10/19/16  Yes Lada, Satira Anis, MD  linaclotide (LINZESS) 72 MCG capsule Take 1 capsule (72 mcg total) by mouth daily before breakfast. 08/21/16  Yes [name], MD  magnesium oxide (MAG-OX) 400 MG tablet Take 1 tablet (400 mg total) by mouth 3 (three) times daily. 11/15/16  Yes Lada, Satira Anis, MD  metoprolol tartrate (LOPRESSOR) 25 MG tablet Take 0.5 tablets (12.5 mg total) by mouth 2 (two) times daily. 04/20/17  Yes Lada, Satira Anis, MD  Multiple Vitamin (MULTIVITAMIN) capsule Take 1 capsule by mouth daily.    Yes [provider]  pantoprazole (PROTONIX) 40  MG tablet Take 1 tablet (40 mg total) by mouth daily. 08/21/16  Yes [name], MD  QUEtiapine (SEROQUEL) 25 MG tablet Take 25 mg by mouth 2 (two) times daily.    Yes [provider]  QUEtiapine (SEROQUEL) 400 MG tablet Take 800 mg by mouth at bedtime.    Yes [provider]  ranitidine (ZANTAC) 300 MG tablet Take 1 tablet (300 mg total) by mouth at bedtime. 05/15/16  Yes Lada, Satira Anis, MD  zolpidem (AMBIEN) 10 MG tablet Take 10 mg by mouth at bedtime.  07/26/15  Yes [provider]  ACCU-CHEK AVIVA PLUS test strip Check FSBS three times a day; LON 99 months, DX E11.40 05/10/16   Lada, Satira Anis, MD  ACCU-CHEK SOFTCLIX LANCETS lancets Check FSBS three times a day; LON 99 months, DX E11.40 05/10/16   Lada, Satira Anis, MD  Insulin Syringe-Needle U-100  (ULTICARE INSULIN SYRINGE) 31G X 1/4" 0.3 ML MISC Inject 1 each into the skin 5 (five) times daily as needed. 09/22/16   Lada, Satira Anis, MD  ondansetron (ZOFRAN ODT) 4 MG disintegrating tablet Take 1 tablet (4 mg total) by mouth every 8 (eight) hours as needed for nausea or vomiting. Patient not taking: Reported on 05/27/2017 10/28/16   Earleen Newport, MD     Allergies Morphine and related; Codeine; Codeine; Hydrocodone-acetaminophen; Mirtazapine; Morphine and related; Sulfamethoxazole-trimethoprim; and Vicodin [hydrocodone-acetaminophen]   Family History  Problem Relation Age of Onset  . Diabetes Mother   . Heart disease Mother   . Hyperlipidemia Mother   . Hypertension Mother   . Kidney disease Mother   . Mental illness Mother   . Hypothyroidism Mother   . Stroke Mother   . Osteoporosis Mother   . Glaucoma Mother   . Congestive Heart Failure Mother   . Hypertension Father   . Asthma Daughter   . Cancer Daughter        throat  . Bipolar disorder Daughter   . Heart disease Maternal Grandfather   . Rheum arthritis Maternal Grandfather   . Cancer Maternal Grandfather        liver  . Kidney disease Maternal Grandfather   . Cancer Paternal Grandmother        lung  . Kidney disease Brother   . Bipolar disorder Daughter   . Hypertension Daughter   . Restless legs syndrome Daughter     Social History Social History  Substance Use Topics  . Smoking status: Never Smoker  . Smokeless tobacco: Never Used  . Alcohol use No    Review of Systems  Constitutional:   No fever or chills.  ENT:   No sore throat. No rhinorrhea. Cardiovascular:   No chest pain or syncope. Respiratory:   No dyspnea or cough. Gastrointestinal:   Negative for abdominal pain, vomiting and diarrhea.  Musculoskeletal:   Negative for focal pain or swelling All other systems reviewed and are negative except as documented above in ROS and  HPI.  ____________________________________________   PHYSICAL EXAM:  VITAL SIGNS: ED Triage Vitals [06/06/17 1549]  Enc Vitals Group     BP 115/62     Pulse Rate 85     Resp 14     Temp 98.7 F (37.1 C)     Temp src      SpO2 95 %     Weight 195 lb (88.5 kg)     Height 4' 11"  (1.499 m)     Head Circumference  Peak Flow      Pain Score 0     Pain Loc      Pain Edu?      Excl. in [place]?     Vital signs reviewed, nursing assessments reviewed.   Constitutional:   Alert and oriented.not in distress. Eyes:   No scleral icterus.  EOMI. No nystagmus. No conjunctival pallor. PERRL. ENT   Head:   Normocephalic and atraumatic.   Nose:   No congestion/rhinnorhea.    Mouth/Throat:   dry mucous membranes, no pharyngeal erythema. No peritonsillar mass.    Neck:   No meningismus. Full ROM Hematological/Lymphatic/Immunilogical:   No cervical lymphadenopathy. Cardiovascular:   RRR. Symmetric bilateral radial and DP pulses.  No murmurs.  Respiratory:   Normal respiratory effort without tachypnea/retractions. Breath sounds are clear and equal bilaterally. No wheezes/rales/rhonchi. Gastrointestinal:   Soft and nontender. Non distended. There is no CVA tenderness.  No rebound, rigidity, or guarding. Genitourinary:   deferred Musculoskeletal:   Normal range of motion in all extremities. No joint effusions.  No lower extremity tenderness.  No edema. Neurologic:   Normal speech and language.  Motor grossly intact. No gross focal neurologic deficits are appreciated.  Skin:    Skin is warm, dry and intact. No rash noted.  No petechiae, purpura, or bullae.  ____________________________________________    LABS (pertinent positives/negatives) (all labs ordered are listed, but only abnormal results are displayed) Labs Reviewed  BASIC METABOLIC PANEL - Abnormal; Notable for the following:       Result Value   Sodium 132 (*)    Chloride 97 (*)    Glucose, Bld 525 (*)     Creatinine, Ser 1.17 (*)    GFR calc non Af Amer 52 (*)    All other components within normal limits  CBC - Abnormal; Notable for the following:    WBC 11.3 (*)    All other components within normal limits  URINALYSIS, COMPLETE (UACMP) WITH MICROSCOPIC - Abnormal; Notable for the following:    Color, Urine COLORLESS (*)    APPearance CLEAR (*)    Glucose, UA >=500 (*)    Squamous Epithelial / LPF 0-5 (*)    All other components within normal limits  GLUCOSE, CAPILLARY - Abnormal; Notable for the following:    Glucose-Capillary 523 (*)    All other components within normal limits  HEPATIC FUNCTION PANEL - Abnormal; Notable for the following:    Bilirubin, Direct <0.1 (*)    All other components within normal limits  BLOOD GAS, VENOUS - Abnormal; Notable for the following:    pCO2, Ven 43 (*)    pO2, Ven 56.0 (*)    Bicarbonate 28.5 (*)    Acid-Base Excess 3.7 (*)    All other components within normal limits  LIPASE, BLOOD  PREGNANCY, URINE  GLUCOSE, CAPILLARY  CBG MONITORING, ED   ____________________________________________   EKG  interpreted by me Normal sinus rhythm rate of 88, left axis, normal intervals. Poor R-wave progression in anterior precordial leads. Voltage criteria for LVH in the high lateral leads. No acute ischemic changes.  ____________________________________________    RADIOLOGY  No results found.  ____________________________________________   PROCEDURES Procedures  ____________________________________________   INITIAL IMPRESSION / ASSESSMENT AND PLAN / ED COURSE  Pertinent labs & imaging results that were available during my care of the patient were reviewed by me and considered in my medical decision making (see chart for details).  patient not in distress, presents without focal symptoms, has  normal vital signs. Appears to be dehydrated secondary to hyperglycemia and osmotic diuresis. No evidence of acute infection as a precipitant of  the destabilize blood sugars. She has recently been treated for pneumonia and been on prednisone which may be the culprit. She also has apparently difficult to control diabetes, taking upwards of 200 units of insulin daily.  Low suspicion of stroke meningitis encephalitis ACS PE dissection AAA biliary disease appendicitis without perforation or obstruction. We'll hydrated with 2 L IV fluids while checking labs for evidence of metabolic acidosis.   ----------------------------------------- 9:24 PM on 06/06/2017 -----------------------------------------  Labs reassuring, vital stable, patient feels great and her blood sugar is 97 after 2 L IV fluids and 10 units of insulin. We'll discharge her home, continue her insulin regimen and follow up with her doctor.     ____________________________________________   FINAL CLINICAL IMPRESSION(S) / ED DIAGNOSES  Final diagnoses:  Dehydration  Hyperglycemia      New Prescriptions   No medications on file     Portions of this note were generated with dragon dictation software. Dictation errors may occur despite best attempts at proofreading.    Carrie Mew, MD 06/06/17 2124

## 2017-06-06 NOTE — ED Notes (Addendum)
Elevated CBG today. Meter reading HIGH. Pt also c/o dizziness. Pt alert and oriented X4, active, cooperative, pt in NAD. RR even and unlabored, color WNL.  Denies CP or SOB.

## 2017-06-08 ENCOUNTER — Inpatient Hospital Stay: Payer: Self-pay | Admitting: Family Medicine

## 2017-06-15 ENCOUNTER — Ambulatory Visit: Payer: Self-pay | Admitting: Podiatry

## 2017-06-15 ENCOUNTER — Other Ambulatory Visit: Payer: Self-pay

## 2017-06-15 MED ORDER — AMLODIPINE BESYLATE 2.5 MG PO TABS: 2.5000 mg | ORAL_TABLET | Freq: Every day | ORAL | 11 refills | Status: DC

## 2017-06-18 LAB — BLOOD GAS, VENOUS
ACID-BASE EXCESS: 3.7 mmol/L — AB (ref 0.0–2.0)
Bicarbonate: 28.5 mmol/L — ABNORMAL HIGH (ref 20.0–28.0)
O2 SAT: 89.6 %
PATIENT TEMPERATURE: 37
pCO2, Ven: 43 mmHg — ABNORMAL LOW (ref 44.0–60.0)
pH, Ven: 7.43 (ref 7.250–7.430)
pO2, Ven: 56 mmHg — ABNORMAL HIGH (ref 32.0–45.0)

## 2017-06-19 ENCOUNTER — Ambulatory Visit (INDEPENDENT_AMBULATORY_CARE_PROVIDER_SITE_OTHER): Payer: Medicare HMO | Admitting: Podiatry

## 2017-06-19 DIAGNOSIS — M76822 Posterior tibial tendinitis, left leg: Secondary | ICD-10-CM

## 2017-06-19 DIAGNOSIS — M79609 Pain in unspecified limb: Secondary | ICD-10-CM

## 2017-06-19 DIAGNOSIS — R202 Paresthesia of skin: Secondary | ICD-10-CM

## 2017-06-19 MED ORDER — GABAPENTIN 600 MG PO TABS: 600.0000 mg | ORAL_TABLET | Freq: Two times a day (BID) | ORAL | 2 refills | Status: DC

## 2017-06-21 NOTE — Progress Notes (Signed)
   HPI: 51 year old female presents to the office today for follow up evaluation of left foot and ankle pain. She states the left foot was improving significantly but now the pain has returned. She describes it as burning and tingling pain in the arch and rates it at 6/10. She is here to discuss the nerve conduction study results. She presents today for further treatment and evaluation.    Past Medical History:  Diagnosis Date  . Asthma   . Bipolar depression ([place])   . CHF (congestive heart failure) ([place])   . Chronic headaches   . COPD (chronic obstructive pulmonary disease) ([place])   . Diabetic neuropathy ([place])   . GERD (gastroesophageal reflux disease)   . Headache   . Heart attack ([place])   . Hyperlipidemia   . Hypertension   . Hypomagnesemia   . Hypothyroid   . Irregular heart rhythm   . Liver lesion    Favored to be benign on CT; MRI of abd w/contract in Aug 2016. Liver Biopsy-Negative, March 2016  . Morbid obesity ([place])   . OSA (obstructive sleep apnea)   . Pinched nerve    Back  . Pneumonia   . RLS (restless legs syndrome)   . Seasonal allergies   . Sleep apnea   . Type 2 diabetes, uncontrolled, with neuropathy ([place]) 10/15/2015   Overview:  Microalbumin 7.7 12/2010: Hgb A1c 10.2% 12/2010 and 9.7% 03/2011, eye exam 10/2009 [facility] DM clinic   . Urinary incontinence   . Vitamin D deficiency      Physical Exam: General: The patient is alert and oriented x3 in no acute distress.  Dermatology: Skin is warm, dry and supple bilateral lower extremities. Negative for open lesions or macerations.  Vascular: Palpable pedal pulses bilaterally. No edema or erythema noted. Capillary refill within normal limits.  Neurological: Epicritic and protective threshold grossly intact bilaterally.   Musculoskeletal Exam: Pain on palpation to all areas and aspects of the left foot. There is also significant pain with light touch as well. Range of motion within normal limits to all pedal and ankle  joints bilateral. Muscle strength 5/5 in all groups bilateral.   Assessment: - DM polyneuropathy vs CRPS left  Plan of Care:  - Patient was evaluated today. - Nerve conduction study results were reviewed. - Injection of 0.5 mLs Celestone Soluspan injected into the posterior tibial nerve left. - Prescription for neuropathy pain cream to be dispensed from [facility]. - Recommended controlling blood sugar levels. Recommended follow-up with Dr. Ronnald Collum, endocrinology. - Prescription for gabapentin 600 mg twice a day given to patient. - Return to clinic when necessary.   Edrick Kins, DPM [facility]  Dr. Edrick Kins, DPM    [address], [place] [address]                Office [phone]  Fax [phone]

## 2017-06-26 ENCOUNTER — Emergency Department
Admission: EM | Admit: 2017-06-26 | Discharge: 2017-06-26 | Disposition: A | Discharge status: Home / Self Care | Payer: Medicare HMO | Attending: Emergency Medicine | Admitting: Emergency Medicine

## 2017-06-26 ENCOUNTER — Encounter: Payer: Self-pay | Admitting: Emergency Medicine

## 2017-06-26 ENCOUNTER — Emergency Department: Payer: Medicare HMO

## 2017-06-26 DIAGNOSIS — Z7982 Long term (current) use of aspirin: Secondary | ICD-10-CM | POA: Insufficient documentation

## 2017-06-26 DIAGNOSIS — Z79899 Other long term (current) drug therapy: Secondary | ICD-10-CM | POA: Insufficient documentation

## 2017-06-26 DIAGNOSIS — I129 Hypertensive chronic kidney disease with stage 1 through stage 4 chronic kidney disease, or unspecified chronic kidney disease: Secondary | ICD-10-CM | POA: Insufficient documentation

## 2017-06-26 DIAGNOSIS — E039 Hypothyroidism, unspecified: Secondary | ICD-10-CM | POA: Insufficient documentation

## 2017-06-26 DIAGNOSIS — E1122 Type 2 diabetes mellitus with diabetic chronic kidney disease: Secondary | ICD-10-CM | POA: Insufficient documentation

## 2017-06-26 DIAGNOSIS — J449 Chronic obstructive pulmonary disease, unspecified: Secondary | ICD-10-CM | POA: Diagnosis not present

## 2017-06-26 DIAGNOSIS — Z85828 Personal history of other malignant neoplasm of skin: Secondary | ICD-10-CM | POA: Insufficient documentation

## 2017-06-26 DIAGNOSIS — Z794 Long term (current) use of insulin: Secondary | ICD-10-CM | POA: Diagnosis not present

## 2017-06-26 DIAGNOSIS — R0789 Other chest pain: Secondary | ICD-10-CM | POA: Insufficient documentation

## 2017-06-26 DIAGNOSIS — R079 Chest pain, unspecified: Secondary | ICD-10-CM

## 2017-06-26 DIAGNOSIS — N183 Chronic kidney disease, stage 3 (moderate): Secondary | ICD-10-CM | POA: Diagnosis not present

## 2017-06-26 LAB — BASIC METABOLIC PANEL
ANION GAP: 15 (ref 5–15)
BUN: 19 mg/dL (ref 6–20)
CO2: 24 mmol/L (ref 22–32)
Calcium: 9.7 mg/dL (ref 8.9–10.3)
Chloride: 97 mmol/L — ABNORMAL LOW (ref 101–111)
Creatinine, Ser: 1.05 mg/dL — ABNORMAL HIGH (ref 0.44–1.00)
GFR calc Af Amer: >60 mL/min (ref >60)
GFR calc non Af Amer: 60 mL/min — ABNORMAL LOW (ref >60)
GLUCOSE: 364 mg/dL — AB (ref 65–99)
POTASSIUM: 4.4 mmol/L (ref 3.5–5.1)
Sodium: 136 mmol/L (ref 135–145)

## 2017-06-26 LAB — CBC
HEMATOCRIT: 44.2 % (ref 35.0–47.0)
HEMOGLOBIN: 14.3 g/dL (ref 12.0–16.0)
MCH: 28.7 pg (ref 26.0–34.0)
MCHC: 32.2 g/dL (ref 32.0–36.0)
MCV: 89.1 fL (ref 80.0–100.0)
Platelets: 238 10*3/uL (ref 150–440)
RBC: 4.96 MIL/uL (ref 3.80–5.20)
RDW: 14.5 % (ref 11.5–14.5)
WBC: 11.5 10*3/uL — ABNORMAL HIGH (ref 3.6–11.0)

## 2017-06-26 LAB — FIBRIN DERIVATIVES D-DIMER (ARMC ONLY): Fibrin derivatives D-dimer (ARMC): 255.73 ng/mL (FEU) (ref 0.00–499.00)

## 2017-06-26 LAB — TROPONIN I
Troponin I: <0.03 ng/mL (ref <0.03)
Troponin I: <0.03 ng/mL (ref <0.03)

## 2017-06-26 MED ORDER — SODIUM CHLORIDE 0.9 % IV BOLUS (SEPSIS)
1000.0000 mL | Freq: Once | INTRAVENOUS | Status: AC
  Administered 2017-06-26: 1000 mL via INTRAVENOUS

## 2017-06-26 MED ORDER — ALUM & MAG HYDROXIDE-SIMETH 200-200-20 MG/5ML PO SUSP: 15.0000 mL | Freq: Four times a day (QID) | ORAL | 0 refills | Status: DC | PRN

## 2017-06-26 MED ORDER — GI COCKTAIL ~~LOC~~
30.0000 mL | ORAL | Status: AC
  Administered 2017-06-26: 30 mL via ORAL
  Filled 2017-06-26: qty 30

## 2017-06-26 MED ORDER — INSULIN ASPART 100 UNIT/ML ~~LOC~~ SOLN
6.0000 [IU] | Freq: Once | SUBCUTANEOUS | Status: AC
  Administered 2017-06-26: 6 [IU] via INTRAVENOUS
  Filled 2017-06-26: qty 1

## 2017-06-26 MED ORDER — FAMOTIDINE 20 MG PO TABS: 20.0000 mg | ORAL_TABLET | Freq: Two times a day (BID) | ORAL | 0 refills | Status: DC

## 2017-06-26 MED ORDER — FAMOTIDINE 20 MG PO TABS
40.0000 mg | ORAL_TABLET | Freq: Once | ORAL | Status: AC
  Administered 2017-06-26: 40 mg via ORAL
  Filled 2017-06-26: qty 2

## 2017-06-26 NOTE — ED Triage Notes (Signed)
Patient presents to ED via POV from home. Patient reports sudden onset of chest pressure this morning while lying down. Patient denies the pain waking her from her sleep. History of MI x2 and CAD. Patient reports pain is worse when she lays flat. Patient also reports SOB. Even and non labored respirations noted.

## 2017-06-26 NOTE — Discharge Instructions (Signed)
Please make an appointment to see her primary care physician in the next 1-2 days for follow-up. Please follow the instructions for a reflux disease diet, and take the prescribed medications which could help your symptoms. Eat small regular meals throughout the day, and avoid large meals or foods that are high in fat or spice.  Return to the emergency department if you develop chest pain, shortness of breath, lightheadedness or fainting, fever, or any other symptoms concerning to you.

## 2017-06-26 NOTE — ED Notes (Signed)
Patient transported to X-ray 

## 2017-06-26 NOTE — ED Provider Notes (Signed)
[facility] Emergency Department Provider Note  ____________________________________________  Time seen: Approximately 3:49 PM  I have reviewed the triage vital signs and the nursing notes.   HISTORY  Chief Complaint Chest Pain    HPI [name] is a 51 y.o. female who complains of burning central chest pain which is nonradiating, going on since this morning. Constant for the past 8-9 hours. Worse lying flat, better  sitting upright.  somewhat worsened by deep breathing. Not exertional. No fevers chills or cough. Mild to moderate intensity.  patient was hospitalized 1 month ago due to pneumonia.   Past Medical History:  Diagnosis Date  . Asthma   . Bipolar depression ([place])   . CHF (congestive heart failure) ([place])   . Chronic headaches   . COPD (chronic obstructive pulmonary disease) ([place])   . Diabetic neuropathy ([place])   . GERD (gastroesophageal reflux disease)   . Headache   . Heart attack ([place])   . Hyperlipidemia   . Hypertension   . Hypomagnesemia   . Hypothyroid   . Irregular heart rhythm   . Liver lesion    Favored to be benign on CT; MRI of abd w/contract in Aug 2016. Liver Biopsy-Negative, March 2016  . Morbid obesity ([place])   . OSA (obstructive sleep apnea)   . Pinched nerve    Back  . Pneumonia   . RLS (restless legs syndrome)   . Seasonal allergies   . Sleep apnea   . Type 2 diabetes, uncontrolled, with neuropathy ([place]) 10/15/2015   Overview:  Microalbumin 7.7 12/2010: Hgb A1c 10.2% 12/2010 and 9.7% 03/2011, eye exam 10/2009 [facility]   . Urinary incontinence   . Vitamin D deficiency      Patient Active Problem List   Diagnosis Date Noted  . Lobar pneumonia ([place]) 06/05/2017  . Multifocal pneumonia 05/27/2017  . Pleuritic chest pain 05/27/2017  . Hypertriglyceridemia 12/21/2016  . Pansinusitis 11/15/2016  . 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 Breast cancer screening 09/28/2016  . Urine frequency  09/28/2016  . Preventative health care 09/28/2016  . Fatigue 05/01/2016  . Urinary tract infection 03/14/2016  . Adenomatous colon polyp 10/15/2015  . Airway hyperreactivity 10/15/2015  . Bipolar affective disorder ([place]) 10/15/2015  . CN (constipation) 10/15/2015  . CAFL (chronic airflow limitation) ([place]) 10/15/2015  . Drug-induced hepatic toxicity 10/15/2015  . Blood in feces 10/15/2015  . HLD (hyperlipidemia) 10/15/2015  . Hypoxemia 10/15/2015  . NASH (nonalcoholic steatohepatitis) 10/15/2015  . Morbid obesity ([place]) 10/15/2015  . Restless leg syndrome 10/15/2015  . Type 2 diabetes, uncontrolled, with neuropathy ([place]) 10/15/2015  . Elevated alkaline phosphatase level 09/22/2015  . Urinary bladder incontinence 09/22/2015  . Low back pain with left-sided sciatica 08/18/2015  . Encounter for monitoring tricyclic antidepressant therapy 05/13/2015  . Bipolar disorder ([place]) 05/11/2015  . Liver disease 05/11/2015  . Diabetes ([place]) 05/11/2015  . Apnea, sleep 05/05/2015  . Heart valve disease 05/05/2015  . Chronic kidney disease, stage III (moderate) (HCC) 03/15/2015  . Migraine 03/12/2015  . COPD (chronic obstructive pulmonary disease) ([place]) 01/20/2015  . 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 GERD (gastroesophageal reflux disease) 01/20/2015  . Hypothyroid   . Vitamin D deficiency   . Urinary incontinence   . Liver lesion   . Hypomagnesemia   . Obstructive sleep apnea 10/20/2013  . PERIPHERAL NEUROPATHY 03/04/2009  . HEMORRHOIDS, EXTERNAL 09/03/2008  . ALLERGIC RHINITIS 07/04/2007  . ASTHMA, PERSISTENT, MODERATE 07/04/2007  .  INTERSTITIAL CYSTITIS 07/04/2007  . HYPERTENSION, BENIGN ESSENTIAL 06/19/2007     Past Surgical History:  Procedure Laterality Date  . ABDOMINAL HYSTERECTOMY    . ABDOMINAL HYSTERECTOMY     Partial  . BLADDER SURGERY     x 2  . CHOLECYSTECTOMY    . LIVER BIOPSY  March 2016   Negative  . LUNG BIOPSY    . TUBAL LIGATION       Prior  to Admission medications   Medication Sig Start Date End Date Taking? Authorizing Provider  ACCU-CHEK AVIVA PLUS test strip Check FSBS three times a day; LON 99 months, DX E11.40 05/10/16   Lada, Satira Anis, MD  ACCU-CHEK SOFTCLIX LANCETS lancets Check FSBS three times a day; LON 99 months, DX E11.40 05/10/16   Lada, Satira Anis, MD  albuterol (PROVENTIL HFA;VENTOLIN HFA) 108 (90 Base) MCG/ACT inhaler Inhale 2 puffs into the lungs every 4 (four) hours as needed for wheezing or shortness of breath. 06/05/17   Arnetha Courser, MD  alum & mag hydroxide-simeth (MAALOX REGULAR STRENGTH) 200-200-20 MG/5ML suspension Take 15 mLs by mouth every 6 (six) hours as needed for indigestion or heartburn. 06/26/17   Eula Listen, MD  amLODipine (NORVASC) 2.5 MG tablet Take 1 tablet (2.5 mg total) by mouth daily. 06/15/17   Arnetha Courser, MD  aspirin EC 81 MG tablet Take 1 tablet (81 mg total) by mouth daily. 11/03/16   Arnetha Courser, MD  atorvastatin (LIPITOR) 40 MG tablet Take 1 tablet (40 mg total) by mouth at bedtime. 04/13/17   Arnetha Courser, MD  budesonide-formoterol (SYMBICORT) 160-4.5 MCG/ACT inhaler Inhale 2 puffs into the lungs 2 (two) times daily. Every day; rinse mouth out after each use 06/05/17   Lada, Satira Anis, MD  clobetasol cream (TEMOVATE) 8.54 % Apply 1 application topically 2 (two) times daily. If needed; too strong for face, underarms, groin, children 02/02/17   Lada, Satira Anis, MD  DULoxetine (CYMBALTA) 60 MG capsule Take 120 mg by mouth daily.    [provider]  ezetimibe (ZETIA) 10 MG tablet Take 10 mg by mouth daily.  01/05/17   [provider]  famotidine (PEPCID) 20 MG tablet Take 1 tablet (20 mg total) by mouth 2 (two) times daily. 06/26/17 06/26/18  Eula Listen, MD  fluticasone (FLONASE) 50 MCG/ACT nasal spray Place 2 sprays into both nostrils daily. 01/09/17 01/09/18  Arnetha Courser, MD  gabapentin (NEURONTIN) 300 MG capsule Take 1 capsule (300 mg total) by mouth  2 (two) times daily. 04/11/17   Arnetha Courser, MD  gabapentin (NEURONTIN) 600 MG tablet Take 1 tablet (600 mg total) by mouth 2 (two) times daily. 06/19/17   Edrick Kins, DPM  HUMULIN R U-500 KWIKPEN 500 UNIT/ML kwikpen 60 Units 3 (three) times daily with meals.  12/06/16   [provider]  Icosapent Ethyl (VASCEPA) 1 g CAPS Two pills by mouth twice a day for cholesterol (this replaces Lovaza) 09/29/16   Lada, Satira Anis, MD  insulin aspart (NOVOLOG) 100 UNIT/ML injection Short-acting insulin TID ac as follows: 70-150=30units 151-200=35units 201-250=40units 251-300=45units 301-350=50units 351-400=55units 401-450=60units Patient taking differently: Inject 30-60 Units into the skin 3 (three) times daily with meals. Short-acting insulin TID ac as follows: 70-150=30units 151-200=35units 201-250=40units 251-300=45units 301-350=50units 351-400=55units 401-450=60units 09/29/16   Lada, Satira Anis, MD  Insulin Syringe-Needle U-100 (ULTICARE INSULIN SYRINGE) 31G X 1/4" 0.3 ML MISC Inject 1 each into the skin 5 (five) times daily as needed. 09/22/16   Lada,  Satira Anis, MD  JARDIANCE 25 MG TABS tablet Take 25 mg by mouth daily. 02/26/17   [provider]  lamoTRIgine (LAMICTAL) 150 MG tablet Take 150 mg by mouth daily.  03/22/15   [provider]  levothyroxine (SYNTHROID, LEVOTHROID) 75 MCG tablet Take 1 tablet (75 mcg total) by mouth daily. 10/19/16   Arnetha Courser, MD  linaclotide Rolan Lipa) 72 MCG capsule Take 1 capsule (72 mcg total) by mouth daily before breakfast. 08/21/16   [name], MD  magnesium oxide (MAG-OX) 400 MG tablet Take 1 tablet (400 mg total) by mouth 3 (three) times daily. 11/15/16   Arnetha Courser, MD  metoprolol tartrate (LOPRESSOR) 25 MG tablet Take 0.5 tablets (12.5 mg total) by mouth 2 (two) times daily. 04/20/17   Arnetha Courser, MD  Multiple Vitamin (MULTIVITAMIN) capsule Take 1 capsule by mouth daily.     [provider]  ondansetron  (ZOFRAN ODT) 4 MG disintegrating tablet Take 1 tablet (4 mg total) by mouth every 8 (eight) hours as needed for nausea or vomiting. Patient not taking: Reported on 05/27/2017 10/28/16   Earleen Newport, MD  pantoprazole (PROTONIX) 40 MG tablet Take 1 tablet (40 mg total) by mouth daily. 08/21/16   [name], MD  QUEtiapine (SEROQUEL) 25 MG tablet Take 25 mg by mouth 2 (two) times daily.     [provider]  QUEtiapine (SEROQUEL) 400 MG tablet Take 800 mg by mouth at bedtime.     [provider]  ranitidine (ZANTAC) 300 MG tablet Take 1 tablet (300 mg total) by mouth at bedtime. 05/15/16   Lada, Satira Anis, MD  zolpidem (AMBIEN) 10 MG tablet Take 10 mg by mouth at bedtime.  07/26/15   [provider]     Allergies Morphine and related; Codeine; Codeine; Hydrocodone-acetaminophen; Mirtazapine; Morphine and related; Sulfamethoxazole-trimethoprim; and Vicodin [hydrocodone-acetaminophen]   Family History  Problem Relation Age of Onset  . Diabetes Mother   . Heart disease Mother   . Hyperlipidemia Mother   . Hypertension Mother   . Kidney disease Mother   . Mental illness Mother   . Hypothyroidism Mother   . Stroke Mother   . Osteoporosis Mother   . Glaucoma Mother   . Congestive Heart Failure Mother   . Hypertension Father   . Asthma Daughter   . Cancer Daughter        throat  . Bipolar disorder Daughter   . Heart disease Maternal Grandfather   . Rheum arthritis Maternal Grandfather   . Cancer Maternal Grandfather        liver  . Kidney disease Maternal Grandfather   . Cancer Paternal Grandmother        lung  . Kidney disease Brother   . Bipolar disorder Daughter   . Hypertension Daughter   . Restless legs syndrome Daughter     Social History Social History  Substance Use Topics  . Smoking status: Never Smoker  . Smokeless tobacco: Never Used  . Alcohol use No    Review of Systems  Constitutional:   No fever or chills.  ENT:   No sore  throat. No rhinorrhea. Cardiovascular:   positive as above chest pain without syncope. Respiratory:   No dyspnea or cough. Gastrointestinal:   Negative for abdominal pain, vomiting and diarrhea.  Musculoskeletal:   Negative for focal pain or swelling All other systems reviewed and are negative except as documented above in ROS and HPI.  ____________________________________________   PHYSICAL EXAM:  VITAL SIGNS: ED Triage Vitals [06/26/17 1238]  Enc Vitals Group     BP (!) 144/80     Pulse Rate 94     Resp 15     Temp 98.2 F (36.8 C)     Temp Source Oral     SpO2 94 %     Weight 198 lb (89.8 kg)     Height 4' 11"  (1.499 m)     Head Circumference      Peak Flow      Pain Score 8     Pain Loc      Pain Edu?      Excl. in [place]?     Vital signs reviewed, nursing assessments reviewed.   Constitutional:   Alert and oriented. Well appearing and in no distress. Eyes:   No scleral icterus.  EOMI. No nystagmus. No conjunctival pallor. PERRL. ENT   Head:   Normocephalic and atraumatic.   Nose:   No congestion/rhinnorhea.    Mouth/Throat:   MMM, no pharyngeal erythema. No peritonsillar mass.    Neck:   No meningismus. Full ROM. Hematological/Lymphatic/Immunilogical:   No cervical lymphadenopathy. Cardiovascular:   RRR. Symmetric bilateral radial and DP pulses.  No murmurs.  Respiratory:   Normal respiratory effort without tachypnea/retractions. Breath sounds are clear and equal bilaterally. No wheezes/rales/rhonchi. Gastrointestinal:   Soft and nontender. Non distended. There is no CVA tenderness.  No rebound, rigidity, or guarding. Genitourinary:   deferred Musculoskeletal:   Normal range of motion in all extremities. No joint effusions.  No lower extremity tenderness.  No edema.chest wall nontender Neurologic:   Normal speech and language.  Motor grossly intact. No gross focal neurologic deficits are appreciated.  Skin:    Skin is warm, dry and intact. No rash  noted.  No petechiae, purpura, or bullae.  ____________________________________________    LABS (pertinent positives/negatives) (all labs ordered are listed, but only abnormal results are displayed) Labs Reviewed  BASIC METABOLIC PANEL - Abnormal; Notable for the following:       Result Value   Chloride 97 (*)    Glucose, Bld 364 (*)    Creatinine, Ser 1.05 (*)    GFR calc non Af Amer 60 (*)    All other components within normal limits  CBC - Abnormal; Notable for the following:    WBC 11.5 (*)    All other components within normal limits  TROPONIN I  FIBRIN DERIVATIVES D-DIMER ([facility] ONLY)  TROPONIN I   ____________________________________________   EKG  interpreted by me Normal sinus rhythm rate of 91, left axis, normal intervals. Normal QRS ST segments and T waves  ____________________________________________    RADIOLOGY  Dg Chest 2 View  Result Date: 06/26/2017 CLINICAL DATA:  Chest pain. EXAM: CHEST  2 VIEW COMPARISON:  Radiographs of June 05, 2017. FINDINGS: The heart size and mediastinal contours are within normal limits. Both lungs are clear. No pneumothorax or pleural effusion is noted. The visualized skeletal structures are unremarkable. IMPRESSION: No active cardiopulmonary disease. Electronically Signed   By: Marijo Conception, M.D.   On: 06/26/2017 13:15    ____________________________________________   PROCEDURES Procedures  ____________________________________________   DIFFERENTIAL DIAGNOSIS  ACS PE GERD pneumonia and pneumothorax bronchitis  CLINICAL IMPRESSION / ASSESSMENT AND PLAN / ED COURSE  Pertinent labs & imaging results that were available during my care of the patient were reviewed by me and considered in my medical decision making (see chart for details).   patient well appearing no  acute distress, presents with atypical chest pain. Low suspicion for ACS due to comorbidities, check delta troponin. With her recent hospitalization  we'll check a PE although her symptoms are not very strongly suggestive of pulmonary embolism. No evidence of extremity DVT. Her chest x-ray is clear and does not show pneumonia or pneumothorax, and exam does not reveal any concerns along these lines either. Vital signs are okay. IV fluids for dehydration. GI cocktail for likely GERD.  ----------------------------------------- 3:52 PM on 06/26/2017 -----------------------------------------  Delta troponin negative, d-dimer negative. Patient feeling fine, vital stable. Discharge home and outpatient follow-up. Counseled to continue taking her medications including her insulin as prescribed.      ____________________________________________   FINAL CLINICAL IMPRESSION(S) / ED DIAGNOSES    Final diagnoses:  Chest pain, unspecified type      New Prescriptions   ALUM & MAG HYDROXIDE-SIMETH (MAALOX REGULAR STRENGTH) 200-200-20 MG/5ML SUSPENSION    Take 15 mLs by mouth every 6 (six) hours as needed for indigestion or heartburn.   FAMOTIDINE (PEPCID) 20 MG TABLET    Take 1 tablet (20 mg total) by mouth 2 (two) times daily.     Portions of this note were generated with dragon dictation software. Dictation errors may occur despite best attempts at proofreading.    Carrie Mew, MD 06/26/17 [phone]

## 2017-06-28 MED ORDER — BETAMETHASONE SOD PHOS & ACET 6 (3-3) MG/ML IJ SUSP: 3.0000 mg | Freq: Once | INTRAMUSCULAR | Status: DC

## 2017-07-06 ENCOUNTER — Encounter: Payer: Self-pay | Admitting: Family Medicine

## 2017-07-14 ENCOUNTER — Emergency Department
Admission: EM | Admit: 2017-07-14 | Discharge: 2017-07-14 | Disposition: A | Discharge status: Home / Self Care | Payer: Medicare HMO | Attending: Emergency Medicine | Admitting: Emergency Medicine

## 2017-07-14 ENCOUNTER — Other Ambulatory Visit: Payer: Self-pay

## 2017-07-14 ENCOUNTER — Emergency Department: Payer: Medicare HMO

## 2017-07-14 ENCOUNTER — Encounter: Payer: Self-pay | Admitting: Emergency Medicine

## 2017-07-14 DIAGNOSIS — I110 Hypertensive heart disease with heart failure: Secondary | ICD-10-CM | POA: Diagnosis not present

## 2017-07-14 DIAGNOSIS — Z7982 Long term (current) use of aspirin: Secondary | ICD-10-CM | POA: Diagnosis not present

## 2017-07-14 DIAGNOSIS — N183 Chronic kidney disease, stage 3 (moderate): Secondary | ICD-10-CM | POA: Insufficient documentation

## 2017-07-14 DIAGNOSIS — J449 Chronic obstructive pulmonary disease, unspecified: Secondary | ICD-10-CM | POA: Insufficient documentation

## 2017-07-14 DIAGNOSIS — N12 Tubulo-interstitial nephritis, not specified as acute or chronic: Secondary | ICD-10-CM | POA: Diagnosis not present

## 2017-07-14 DIAGNOSIS — E039 Hypothyroidism, unspecified: Secondary | ICD-10-CM | POA: Insufficient documentation

## 2017-07-14 DIAGNOSIS — E1140 Type 2 diabetes mellitus with diabetic neuropathy, unspecified: Secondary | ICD-10-CM | POA: Insufficient documentation

## 2017-07-14 DIAGNOSIS — Z794 Long term (current) use of insulin: Secondary | ICD-10-CM | POA: Insufficient documentation

## 2017-07-14 DIAGNOSIS — R309 Painful micturition, unspecified: Secondary | ICD-10-CM | POA: Insufficient documentation

## 2017-07-14 DIAGNOSIS — J45909 Unspecified asthma, uncomplicated: Secondary | ICD-10-CM | POA: Insufficient documentation

## 2017-07-14 DIAGNOSIS — M545 Low back pain: Secondary | ICD-10-CM | POA: Diagnosis not present

## 2017-07-14 DIAGNOSIS — I129 Hypertensive chronic kidney disease with stage 1 through stage 4 chronic kidney disease, or unspecified chronic kidney disease: Secondary | ICD-10-CM | POA: Diagnosis not present

## 2017-07-14 DIAGNOSIS — I509 Heart failure, unspecified: Secondary | ICD-10-CM | POA: Diagnosis not present

## 2017-07-14 DIAGNOSIS — R1032 Left lower quadrant pain: Secondary | ICD-10-CM | POA: Diagnosis present

## 2017-07-14 LAB — CBC
HCT: 41.2 % (ref 35.0–47.0)
HEMOGLOBIN: 13.7 g/dL (ref 12.0–16.0)
MCH: 28.9 pg (ref 26.0–34.0)
MCHC: 33.2 g/dL (ref 32.0–36.0)
MCV: 87 fL (ref 80.0–100.0)
Platelets: 264 10*3/uL (ref 150–440)
RBC: 4.73 MIL/uL (ref 3.80–5.20)
RDW: 14.2 % (ref 11.5–14.5)
WBC: 7.8 10*3/uL (ref 3.6–11.0)

## 2017-07-14 LAB — COMPREHENSIVE METABOLIC PANEL
ALBUMIN: 3.9 g/dL (ref 3.5–5.0)
ALK PHOS: 89 U/L (ref 38–126)
ALT: 25 U/L (ref 14–54)
ANION GAP: 11 (ref 5–15)
AST: 31 U/L (ref 15–41)
BUN: 12 mg/dL (ref 6–20)
CALCIUM: 9.4 mg/dL (ref 8.9–10.3)
CO2: 24 mmol/L (ref 22–32)
Chloride: 102 mmol/L (ref 101–111)
Creatinine, Ser: 1.01 mg/dL — ABNORMAL HIGH (ref 0.44–1.00)
GFR calc Af Amer: >60 mL/min (ref >60)
GLUCOSE: 254 mg/dL — AB (ref 65–99)
Potassium: 3.7 mmol/L (ref 3.5–5.1)
Sodium: 137 mmol/L (ref 135–145)
Total Bilirubin: 0.6 mg/dL (ref 0.3–1.2)
Total Protein: 6.9 g/dL (ref 6.5–8.1)

## 2017-07-14 LAB — URINALYSIS, COMPLETE (UACMP) WITH MICROSCOPIC
Bacteria, UA: NONE SEEN
Bilirubin Urine: NEGATIVE
Hgb urine dipstick: NEGATIVE
Ketones, ur: NEGATIVE mg/dL
NITRITE: NEGATIVE
PH: 5 (ref 5.0–8.0)
Protein, ur: NEGATIVE mg/dL
Specific Gravity, Urine: 1.029 (ref 1.005–1.030)

## 2017-07-14 LAB — LIPASE, BLOOD: Lipase: 21 U/L (ref 11–51)

## 2017-07-14 MED ORDER — ONDANSETRON HCL 4 MG/2ML IJ SOLN
4.0000 mg | Freq: Once | INTRAMUSCULAR | Status: AC
  Administered 2017-07-14: 4 mg via INTRAVENOUS

## 2017-07-14 MED ORDER — CEFTRIAXONE SODIUM IN DEXTROSE 20 MG/ML IV SOLN
1.0000 g | Freq: Once | INTRAVENOUS | Status: AC
  Administered 2017-07-14: 1 g via INTRAVENOUS
  Filled 2017-07-14: qty 50

## 2017-07-14 MED ORDER — ONDANSETRON HCL 4 MG/2ML IJ SOLN
INTRAMUSCULAR | Status: AC
  Filled 2017-07-14: qty 2

## 2017-07-14 MED ORDER — OXYCODONE-ACETAMINOPHEN 5-325 MG PO TABS: 1.0000 | ORAL_TABLET | Freq: Four times a day (QID) | ORAL | 0 refills | Status: AC | PRN

## 2017-07-14 MED ORDER — KETOROLAC TROMETHAMINE 30 MG/ML IJ SOLN
INTRAMUSCULAR | Status: AC
  Administered 2017-07-14: 30 mg via INTRAVENOUS
  Filled 2017-07-14: qty 1

## 2017-07-14 MED ORDER — KETOROLAC TROMETHAMINE 30 MG/ML IJ SOLN
30.0000 mg | Freq: Once | INTRAMUSCULAR | Status: AC
  Administered 2017-07-14: 30 mg via INTRAVENOUS

## 2017-07-14 MED ORDER — CEPHALEXIN 500 MG PO CAPS: 500.0000 mg | ORAL_CAPSULE | Freq: Two times a day (BID) | ORAL | 0 refills | Status: AC

## 2017-07-14 NOTE — ED Notes (Signed)
IV attempt x2 unsuccessful, will follow up

## 2017-07-14 NOTE — Discharge Instructions (Signed)
Take the antibiotic as prescribed and finish the full course.  You should try to take over-the-counter pain medications when possible, but you may take the prescribed medication for more severe pain.  Return to the ER for new or worsening pain, fevers, weakness, vomiting or inability to tolerate eating or drinking, or any other new or worsening symptoms that concern you.

## 2017-07-14 NOTE — ED Notes (Signed)
Pt ambulatory upon discharge. Pt verbalized understanding of discharge instructions, prescriptions and follow-up care. Pt in NAD. A&O x4. Skin warm and dry.

## 2017-07-14 NOTE — ED Provider Notes (Signed)
[facility] Emergency Department Provider Note ____________________________________________   First MD Initiated Contact with Patient 07/14/17 1718     (approximate)  I have reviewed the triage vital signs and the nursing notes.   HISTORY  Chief Complaint Abdominal Pain and Back Pain    HPI [name] is a 51 y.o. female with past medical history as noted below, who presents with left lower quadrant abdominal pain, acute onset today, radiating to her left lower back and flank, and associated with burning on urination as well as nausea.  Patient denies associated fever or chills, generalized weakness, diarrhea, vomiting, or vaginal bleeding.    Past Medical History:  Diagnosis Date  . Asthma   . Bipolar depression ([place])   . CHF (congestive heart failure) ([place])   . Chronic headaches   . COPD (chronic obstructive pulmonary disease) ([place])   . Diabetic neuropathy ([place])   . GERD (gastroesophageal reflux disease)   . Headache   . Heart attack ([place])   . Hyperlipidemia   . Hypertension   . Hypomagnesemia   . Hypothyroid   . Irregular heart rhythm   . Liver lesion    Favored to be benign on CT; MRI of abd w/contract in Aug 2016. Liver Biopsy-Negative, March 2016  . Morbid obesity ([place])   . OSA (obstructive sleep apnea)   . Pinched nerve    Back  . Pneumonia   . RLS (restless legs syndrome)   . Seasonal allergies   . Sleep apnea   . Type 2 diabetes, uncontrolled, with neuropathy ([place]) 10/15/2015   Overview:  Microalbumin 7.7 12/2010: Hgb A1c 10.2% 12/2010 and 9.7% 03/2011, eye exam 10/2009 [facility] DM clinic   . Urinary incontinence   . Vitamin D deficiency     Patient Active Problem List   Diagnosis Date Noted  . Lobar pneumonia ([place]) 06/05/2017  . Multifocal pneumonia 05/27/2017  . Pleuritic chest pain 05/27/2017  . Hypertriglyceridemia 12/21/2016  . Pansinusitis 11/15/2016  . 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 Breast  cancer screening 09/28/2016  . Urine frequency 09/28/2016  . Preventative health care 09/28/2016  . Fatigue 05/01/2016  . Urinary tract infection 03/14/2016  . Adenomatous colon polyp 10/15/2015  . Airway hyperreactivity 10/15/2015  . Bipolar affective disorder ([place]) 10/15/2015  . CN (constipation) 10/15/2015  . CAFL (chronic airflow limitation) ([place]) 10/15/2015  . Drug-induced hepatic toxicity 10/15/2015  . Blood in feces 10/15/2015  . HLD (hyperlipidemia) 10/15/2015  . Hypoxemia 10/15/2015  . NASH (nonalcoholic steatohepatitis) 10/15/2015  . Morbid obesity ([place]) 10/15/2015  . Restless leg syndrome 10/15/2015  . Type 2 diabetes, uncontrolled, with neuropathy ([place]) 10/15/2015  . Elevated alkaline phosphatase level 09/22/2015  . Urinary bladder incontinence 09/22/2015  . Low back pain with left-sided sciatica 08/18/2015  . Encounter for monitoring tricyclic antidepressant therapy 05/13/2015  . Bipolar disorder ([place]) 05/11/2015  . Liver disease 05/11/2015  . Diabetes ([place]) 05/11/2015  . Apnea, sleep 05/05/2015  . Heart valve disease 05/05/2015  . Chronic kidney disease, stage III (moderate) (HCC) 03/15/2015  . Migraine 03/12/2015  . COPD (chronic obstructive pulmonary disease) ([place]) 01/20/2015  . 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 GERD (gastroesophageal reflux disease) 01/20/2015  . Hypothyroid   . Vitamin D deficiency   . Urinary incontinence   . Liver lesion   . Hypomagnesemia   . Obstructive sleep apnea 10/20/2013  . PERIPHERAL NEUROPATHY 03/04/2009  . HEMORRHOIDS, EXTERNAL 09/03/2008  . ALLERGIC RHINITIS  07/04/2007  . ASTHMA, PERSISTENT, MODERATE 07/04/2007  . INTERSTITIAL CYSTITIS 07/04/2007  . HYPERTENSION, BENIGN ESSENTIAL 06/19/2007    Past Surgical History:  Procedure Laterality Date  . ABDOMINAL HYSTERECTOMY    . ABDOMINAL HYSTERECTOMY     Partial  . BLADDER SURGERY     x 2  . CHOLECYSTECTOMY    . LIVER BIOPSY  March 2016   Negative  . LUNG  BIOPSY    . TUBAL LIGATION      Prior to Admission medications   Medication Sig Start Date End Date Taking? Authorizing Provider  ACCU-CHEK AVIVA PLUS test strip Check FSBS three times a day; LON 99 months, DX E11.40 05/10/16   Lada, Satira Anis, MD  ACCU-CHEK SOFTCLIX LANCETS lancets Check FSBS three times a day; LON 99 months, DX E11.40 05/10/16   Lada, Satira Anis, MD  albuterol (PROVENTIL HFA;VENTOLIN HFA) 108 (90 Base) MCG/ACT inhaler Inhale 2 puffs into the lungs every 4 (four) hours as needed for wheezing or shortness of breath. 06/05/17   Arnetha Courser, MD  alum & mag hydroxide-simeth (MAALOX REGULAR STRENGTH) 200-200-20 MG/5ML suspension Take 15 mLs by mouth every 6 (six) hours as needed for indigestion or heartburn. 06/26/17   Eula Listen, MD  amLODipine (NORVASC) 2.5 MG tablet Take 1 tablet (2.5 mg total) by mouth daily. 06/15/17   Arnetha Courser, MD  aspirin EC 81 MG tablet Take 1 tablet (81 mg total) by mouth daily. 11/03/16   Arnetha Courser, MD  atorvastatin (LIPITOR) 40 MG tablet Take 1 tablet (40 mg total) by mouth at bedtime. 04/13/17   Arnetha Courser, MD  budesonide-formoterol (SYMBICORT) 160-4.5 MCG/ACT inhaler Inhale 2 puffs into the lungs 2 (two) times daily. Every day; rinse mouth out after each use 06/05/17   Lada, Satira Anis, MD  cephALEXin (KEFLEX) 500 MG capsule Take 1 capsule (500 mg total) 2 (two) times daily for 10 days by mouth. 07/15/17 07/25/17  Arta Silence, MD  clobetasol cream (TEMOVATE) 8.11 % Apply 1 application topically 2 (two) times daily. If needed; too strong for face, underarms, groin, children 02/02/17   Lada, Satira Anis, MD  DULoxetine (CYMBALTA) 60 MG capsule Take 120 mg by mouth daily.    [provider]  ezetimibe (ZETIA) 10 MG tablet Take 10 mg by mouth daily.  01/05/17   [provider]  famotidine (PEPCID) 20 MG tablet Take 1 tablet (20 mg total) by mouth 2 (two) times daily. 06/26/17 06/26/18  Eula Listen, MD    fluticasone (FLONASE) 50 MCG/ACT nasal spray Place 2 sprays into both nostrils daily. 01/09/17 01/09/18  Arnetha Courser, MD  gabapentin (NEURONTIN) 300 MG capsule Take 1 capsule (300 mg total) by mouth 2 (two) times daily. 04/11/17   Arnetha Courser, MD  gabapentin (NEURONTIN) 600 MG tablet Take 1 tablet (600 mg total) by mouth 2 (two) times daily. 06/19/17   Edrick Kins, DPM  HUMULIN R U-500 KWIKPEN 500 UNIT/ML kwikpen 60 Units 3 (three) times daily with meals.  12/06/16   [provider]  Icosapent Ethyl (VASCEPA) 1 g CAPS Two pills by mouth twice a day for cholesterol (this replaces Lovaza) 09/29/16   Lada, Satira Anis, MD  insulin aspart (NOVOLOG) 100 UNIT/ML injection Short-acting insulin TID ac as follows: 70-150=30units 151-200=35units 201-250=40units 251-300=45units 301-350=50units 351-400=55units 401-450=60units Patient taking differently: Inject 30-60 Units into the skin 3 (three) times daily with meals. Short-acting insulin TID ac as follows: 70-150=30units 151-200=35units 201-250=40units 251-300=45units 301-350=50units 351-400=55units 401-450=60units 09/29/16  Arnetha Courser, MD  Insulin Syringe-Needle U-100 (ULTICARE INSULIN SYRINGE) 31G X 1/4" 0.3 ML MISC Inject 1 each into the skin 5 (five) times daily as needed. 09/22/16   Lada, Satira Anis, MD  JARDIANCE 25 MG TABS tablet Take 25 mg by mouth daily. 02/26/17   [provider]  lamoTRIgine (LAMICTAL) 150 MG tablet Take 150 mg by mouth daily.  03/22/15   [provider]  levothyroxine (SYNTHROID, LEVOTHROID) 75 MCG tablet Take 1 tablet (75 mcg total) by mouth daily. 10/19/16   Arnetha Courser, MD  linaclotide Rolan Lipa) 72 MCG capsule Take 1 capsule (72 mcg total) by mouth daily before breakfast. 08/21/16   [name], MD  magnesium oxide (MAG-OX) 400 MG tablet Take 1 tablet (400 mg total) by mouth 3 (three) times daily. 11/15/16   Arnetha Courser, MD  metoprolol tartrate (LOPRESSOR) 25 MG tablet Take 0.5  tablets (12.5 mg total) by mouth 2 (two) times daily. 04/20/17   Arnetha Courser, MD  Multiple Vitamin (MULTIVITAMIN) capsule Take 1 capsule by mouth daily.     [provider]  ondansetron (ZOFRAN ODT) 4 MG disintegrating tablet Take 1 tablet (4 mg total) by mouth every 8 (eight) hours as needed for nausea or vomiting. Patient not taking: Reported on 05/27/2017 10/28/16   Earleen Newport, MD  oxyCODONE-acetaminophen (ROXICET) 5-325 MG tablet Take 1 tablet every 6 (six) hours as needed by mouth for moderate pain or severe pain. 07/14/17 08/13/17  Arta Silence, MD  pantoprazole (PROTONIX) 40 MG tablet Take 1 tablet (40 mg total) by mouth daily. 08/21/16   [name], MD  QUEtiapine (SEROQUEL) 25 MG tablet Take 25 mg by mouth 2 (two) times daily.     [provider]  QUEtiapine (SEROQUEL) 400 MG tablet Take 800 mg by mouth at bedtime.     [provider]  ranitidine (ZANTAC) 300 MG tablet Take 1 tablet (300 mg total) by mouth at bedtime. 05/15/16   Lada, Satira Anis, MD  zolpidem (AMBIEN) 10 MG tablet Take 10 mg by mouth at bedtime.  07/26/15   [provider]    Allergies Morphine and related; Codeine; Codeine; Hydrocodone-acetaminophen; Mirtazapine; Morphine and related; Sulfamethoxazole-trimethoprim; and Vicodin [hydrocodone-acetaminophen]  Family History  Problem Relation Age of Onset  . Diabetes Mother   . Heart disease Mother   . Hyperlipidemia Mother   . Hypertension Mother   . Kidney disease Mother   . Mental illness Mother   . Hypothyroidism Mother   . Stroke Mother   . Osteoporosis Mother   . Glaucoma Mother   . Congestive Heart Failure Mother   . Hypertension Father   . Asthma Daughter   . Cancer Daughter        throat  . Bipolar disorder Daughter   . Heart disease Maternal Grandfather   . Rheum arthritis Maternal Grandfather   . Cancer Maternal Grandfather        liver  . Kidney disease Maternal Grandfather   . Cancer  Paternal Grandmother        lung  . Kidney disease Brother   . Bipolar disorder Daughter   . Hypertension Daughter   . Restless legs syndrome Daughter     Social History Social History   Tobacco Use  . Smoking status: Never Smoker  . Smokeless tobacco: Never Used  Substance Use Topics  . Alcohol use: No  . Drug use: No    Review of Systems  Constitutional: No fever/chills Eyes: No  visual changes. ENT: No sore throat. Cardiovascular: Denies chest pain. Respiratory: Denies shortness of breath. Gastrointestinal: Positive for nausea.  Genitourinary: Positive for dysuria.  Musculoskeletal: Positive for back pain. Skin: Negative for rash. Neurological: Negative for headache.    ____________________________________________   PHYSICAL EXAM:  VITAL SIGNS: ED Triage Vitals  Enc Vitals Group     BP 07/14/17 1429 (!) 145/90     Pulse Rate 07/14/17 1429 98     Resp 07/14/17 1429 16     Temp 07/14/17 1429 98.4 F (36.9 C)     Temp Source 07/14/17 1429 Oral     SpO2 07/14/17 1429 93 %     Weight 07/14/17 1430 196 lb (88.9 kg)     Height 07/14/17 1430 4' 11"  (1.499 m)     Head Circumference --      Peak Flow --      Pain Score 07/14/17 1429 8     Pain Loc --      Pain Edu? --      Excl. in [place]? --     Constitutional: Alert and oriented. Well appearing and in no acute distress. Eyes: Conjunctivae are normal.  No scleral icterus Head: Atraumatic. Nose: No congestion/rhinnorhea. Mouth/Throat: Mucous membranes are moist.   Neck: Normal range of motion.  Cardiovascular: N Good peripheral circulation. Respiratory: Normal respiratory effort.  No retractions. Gastrointestinal: Mild left lower quadrant tenderness. No distention.  Genitourinary: Left CVA tenderness. Musculoskeletal: No lower extremity edema.  Extremities warm and well perfused.  Neurologic:  Normal speech and language. No gross focal neurologic deficits are appreciated.  Skin:  Skin is warm and dry. No rash  noted. Psychiatric: Mood and affect are normal. Speech and behavior are normal.  ____________________________________________   LABS (all labs ordered are listed, but only abnormal results are displayed)  Labs Reviewed  COMPREHENSIVE METABOLIC PANEL - Abnormal; Notable for the following components:      Result Value   Glucose, Bld 254 (*)    Creatinine, Ser 1.01 (*)    All other components within normal limits  URINALYSIS, COMPLETE (UACMP) WITH MICROSCOPIC - Abnormal; Notable for the following components:   Color, Urine YELLOW (*)    APPearance HAZY (*)    Glucose, UA >=500 (*)    Leukocytes, UA TRACE (*)    Squamous Epithelial / LPF 0-5 (*)    All other components within normal limits  LIPASE, BLOOD  CBC   ____________________________________________  EKG   ____________________________________________  RADIOLOGY  CT abd: no ureteral stone, no colitis or diverticulitis  ____________________________________________   PROCEDURES  Procedure(s) performed: No    Critical Care performed: No ____________________________________________   INITIAL IMPRESSION / ASSESSMENT AND PLAN / ED COURSE  Pertinent labs & imaging results that were available during my care of the patient were reviewed by me and considered in my medical decision making (see chart for details).  51 year old female with past medical history as noted above presents with acute onset of left lower quadrant abdominal pain, radiating to the left flank, and associated with dysuria.    Review of past medical records in Epic is notable for a recent admission for pneumonia, and an ED visit for chest pain within the last month, however no recent records pertain to the current visit.  On exam, patient is relatively well-appearing, vital signs are normal, and there is mild tenderness in left lower quadrant and left CVA tenderness.  Differential includes primarily UTI/early pyelonephritis, versus less likely  ureteral stone, and also  significantly less likely diverticulitis or colitis given minimal GI symptoms.  Will obtain basic labs, UA, and noncontrast CT and reassess.    ----------------------------------------- 7:38 PM on 07/14/2017 -----------------------------------------  CT shows no ureteral stone, diverticulitis, or other acute findings.  Presentation therefore consistent with pyelonephritis.  Given patient's well appearance, reassuring vital signs, inability to tolerate p.o., she can be discharged home with oral antibiotics.  Will give percocet as pt told by her GI doctor not to take NSAIDs.  Return precautions given, and patient expresses understanding.  I answered all of her questions.  ____________________________________________   FINAL CLINICAL IMPRESSION(S) / ED DIAGNOSES  Final diagnoses:  Pyelonephritis      NEW MEDICATIONS STARTED DURING THIS VISIT:  This SmartLink is deprecated. Use AVSMEDLIST instead to display the medication list for a patient.   Note:  This document was prepared using Dragon voice recognition software and may include unintentional dictation errors.    Arta Silence, MD 07/14/17 [phone]

## 2017-07-14 NOTE — ED Triage Notes (Signed)
Pt to ED via POV. Pt c/o LLQ abd pain and lower back pain with dysuria. Pt states that these symptoms started this morning. Pt denies N/V/D, and fever. Pt in NAD at this time.

## 2017-07-14 NOTE — ED Notes (Signed)
Patient transported to CT 

## 2017-07-17 ENCOUNTER — Ambulatory Visit (INDEPENDENT_AMBULATORY_CARE_PROVIDER_SITE_OTHER): Payer: Medicare HMO | Admitting: Family Medicine

## 2017-07-17 ENCOUNTER — Encounter: Payer: Self-pay | Admitting: Emergency Medicine

## 2017-07-17 ENCOUNTER — Emergency Department
Admission: EM | Admit: 2017-07-17 | Discharge: 2017-07-17 | Disposition: A | Discharge status: Home / Self Care | Payer: Medicare HMO | Attending: Emergency Medicine | Admitting: Emergency Medicine

## 2017-07-17 ENCOUNTER — Other Ambulatory Visit: Payer: Self-pay

## 2017-07-17 ENCOUNTER — Encounter: Payer: Self-pay | Admitting: Family Medicine

## 2017-07-17 VITALS — BP 118/68 | HR 98 | Temp 98.0°F | Resp 14 | Wt 199.7 lb

## 2017-07-17 DIAGNOSIS — N183 Chronic kidney disease, stage 3 (moderate): Secondary | ICD-10-CM | POA: Diagnosis not present

## 2017-07-17 DIAGNOSIS — Z79899 Other long term (current) drug therapy: Secondary | ICD-10-CM | POA: Diagnosis not present

## 2017-07-17 DIAGNOSIS — R739 Hyperglycemia, unspecified: Secondary | ICD-10-CM

## 2017-07-17 DIAGNOSIS — E1165 Type 2 diabetes mellitus with hyperglycemia: Secondary | ICD-10-CM | POA: Diagnosis not present

## 2017-07-17 DIAGNOSIS — R21 Rash and other nonspecific skin eruption: Secondary | ICD-10-CM | POA: Diagnosis not present

## 2017-07-17 DIAGNOSIS — Z794 Long term (current) use of insulin: Secondary | ICD-10-CM | POA: Diagnosis not present

## 2017-07-17 DIAGNOSIS — E1140 Type 2 diabetes mellitus with diabetic neuropathy, unspecified: Secondary | ICD-10-CM | POA: Diagnosis not present

## 2017-07-17 DIAGNOSIS — B86 Scabies: Secondary | ICD-10-CM

## 2017-07-17 DIAGNOSIS — IMO0002 Reserved for concepts with insufficient information to code with codable children: Secondary | ICD-10-CM

## 2017-07-17 DIAGNOSIS — J45909 Unspecified asthma, uncomplicated: Secondary | ICD-10-CM | POA: Insufficient documentation

## 2017-07-17 DIAGNOSIS — I130 Hypertensive heart and chronic kidney disease with heart failure and stage 1 through stage 4 chronic kidney disease, or unspecified chronic kidney disease: Secondary | ICD-10-CM | POA: Insufficient documentation

## 2017-07-17 DIAGNOSIS — J449 Chronic obstructive pulmonary disease, unspecified: Secondary | ICD-10-CM | POA: Insufficient documentation

## 2017-07-17 DIAGNOSIS — E039 Hypothyroidism, unspecified: Secondary | ICD-10-CM | POA: Insufficient documentation

## 2017-07-17 DIAGNOSIS — Z7982 Long term (current) use of aspirin: Secondary | ICD-10-CM | POA: Insufficient documentation

## 2017-07-17 DIAGNOSIS — Z23 Encounter for immunization: Secondary | ICD-10-CM

## 2017-07-17 DIAGNOSIS — I509 Heart failure, unspecified: Secondary | ICD-10-CM | POA: Insufficient documentation

## 2017-07-17 DIAGNOSIS — F319 Bipolar disorder, unspecified: Secondary | ICD-10-CM | POA: Insufficient documentation

## 2017-07-17 LAB — URINALYSIS, COMPLETE (UACMP) WITH MICROSCOPIC
BILIRUBIN URINE: NEGATIVE
Bacteria, UA: NONE SEEN
HGB URINE DIPSTICK: NEGATIVE
Ketones, ur: 5 mg/dL — AB
Leukocytes, UA: NEGATIVE
NITRITE: NEGATIVE
PH: 5 (ref 5.0–8.0)
Protein, ur: NEGATIVE mg/dL
RBC / HPF: NONE SEEN RBC/hpf (ref 0–5)
SPECIFIC GRAVITY, URINE: 1.026 (ref 1.005–1.030)

## 2017-07-17 LAB — GLUCOSE, CAPILLARY
Glucose-Capillary: >600 mg/dL (ref 65–99)
Glucose-Capillary: 530 mg/dL (ref 65–99)
Glucose-Capillary: 284 mg/dL — ABNORMAL HIGH (ref 65–99)

## 2017-07-17 LAB — CBC
HCT: 40.1 % (ref 35.0–47.0)
HEMOGLOBIN: 13 g/dL (ref 12.0–16.0)
MCH: 28.8 pg (ref 26.0–34.0)
MCHC: 32.4 g/dL (ref 32.0–36.0)
MCV: 88.9 fL (ref 80.0–100.0)
PLATELETS: 231 10*3/uL (ref 150–440)
RBC: 4.51 MIL/uL (ref 3.80–5.20)
RDW: 14.3 % (ref 11.5–14.5)
WBC: 8.4 10*3/uL (ref 3.6–11.0)

## 2017-07-17 LAB — BASIC METABOLIC PANEL
ANION GAP: 15 (ref 5–15)
BUN: 33 mg/dL — ABNORMAL HIGH (ref 6–20)
CALCIUM: 9.2 mg/dL (ref 8.9–10.3)
CO2: 20 mmol/L — AB (ref 22–32)
CREATININE: 1.37 mg/dL — AB (ref 0.44–1.00)
Chloride: 93 mmol/L — ABNORMAL LOW (ref 101–111)
GFR, EST AFRICAN AMERICAN: 50 mL/min — AB (ref >60)
GFR, EST NON AFRICAN AMERICAN: 43 mL/min — AB (ref >60)
GLUCOSE: 683 mg/dL — AB (ref 65–99)
Potassium: 3.8 mmol/L (ref 3.5–5.1)
Sodium: 128 mmol/L — ABNORMAL LOW (ref 135–145)

## 2017-07-17 LAB — GLUCOSE, POCT (MANUAL RESULT ENTRY)

## 2017-07-17 MED ORDER — SODIUM CHLORIDE 0.9 % IV BOLUS (SEPSIS)
1000.0000 mL | Freq: Once | INTRAVENOUS | Status: AC
  Administered 2017-07-17: 1000 mL via INTRAVENOUS

## 2017-07-17 MED ORDER — INSULIN ASPART 100 UNIT/ML ~~LOC~~ SOLN
8.0000 [IU] | Freq: Once | SUBCUTANEOUS | Status: DC
  Filled 2017-07-17: qty 1

## 2017-07-17 MED ORDER — PERMETHRIN 5 % EX CREA: TOPICAL_CREAM | CUTANEOUS | 1 refills | Status: DC

## 2017-07-17 NOTE — Patient Instructions (Signed)
Go from here to the emergency room where they can evaluate your very high blood sugar and treat that

## 2017-07-17 NOTE — ED Triage Notes (Signed)
Pt sent here by Primary Dr, stating her blood glucose is over 500. Pt denies any complaints other than feeling tired. States she takes insulin and following the Dr orders for insulin administration.

## 2017-07-17 NOTE — ED Notes (Signed)
Pt states she feels like she is currently slurring her words

## 2017-07-17 NOTE — ED Triage Notes (Signed)
Pt also states she has a rash across her shoulder blades and on her abdomen, and in scalp. Small sores noted, no oozing, pt states the sores are painful as well. Denies history of shingles, has had chicken pox.

## 2017-07-17 NOTE — Progress Notes (Signed)
BP 118/68 (BP Location: Right Arm)   Pulse 98   Temp 98 F (36.7 C) (Oral)   Resp 14   Wt 199 lb 11.2 oz (90.6 kg)   SpO2 93%   BMI 40.33 kg/m    Subjective:    Patient ID: [name], female    DOB: 1965/11/06, 51 y.o.   MRN: [id number]  HPI: [name] is a 51 y.o. female  Chief Complaint  Patient presents with  . Rash    neck, back, shoulder, head    HPI Patient has had rash on her upper back, shoulders, head, neck; also on the abdomen No fevers, but has had chills Feels bad", "I feel raunchy"; "I feel like I can't sit up" Funny smell to her breath She is very tired and her sugars have been really high  Depression screen [facility] 2/9 07/17/2017 06/05/2017 04/11/2017 03/21/2017 02/12/2017  Decreased Interest 0 0 0 0 0  Down, Depressed, Hopeless 0 0 0 0 0  PHQ - 2 Score 0 0 0 0 0  Some recent data might be hidden    Relevant past medical, surgical, family and social history reviewed Past Medical History:  Diagnosis Date  . Asthma   . Bipolar depression ([place])   . CHF (congestive heart failure) ([place])   . Chronic headaches   . COPD (chronic obstructive pulmonary disease) ([place])   . Diabetic neuropathy ([place])   . GERD (gastroesophageal reflux disease)   . Headache   . Heart attack ([place])   . Hyperlipidemia   . Hypertension   . Hypomagnesemia   . Hypothyroid   . Irregular heart rhythm   . Liver lesion    Favored to be benign on CT; MRI of abd w/contract in Aug 2016. Liver Biopsy-Negative, March 2016  . Morbid obesity ([place])   . OSA (obstructive sleep apnea)   . Pinched nerve    Back  . Pneumonia   . RLS (restless legs syndrome)   . Seasonal allergies   . Sleep apnea   . Type 2 diabetes, uncontrolled, with neuropathy ([place]) 10/15/2015   Overview:  Microalbumin 7.7 12/2010: Hgb A1c 10.2% 12/2010 and 9.7% 03/2011, eye exam 10/2009 [facility] DM clinic   . Urinary incontinence   . Vitamin D deficiency    Past Surgical History:  Procedure Laterality Date  . ABDOMINAL  HYSTERECTOMY    . ABDOMINAL HYSTERECTOMY     Partial  . BLADDER SURGERY     x 2  . CHOLECYSTECTOMY    . LIVER BIOPSY  March 2016   Negative  . LUNG BIOPSY    . TUBAL LIGATION     Social History   Tobacco Use  . Smoking status: Never Smoker  . Smokeless tobacco: Never Used  Substance Use Topics  . Alcohol use: No  . Drug use: No     Interim medical history since last visit reviewed. Allergies and medications reviewed  Review of Systems Per HPI unless specifically indicated above     Objective:    BP 118/68 (BP Location: Right Arm)   Pulse 98   Temp 98 F (36.7 C) (Oral)   Resp 14   Wt 199 lb 11.2 oz (90.6 kg)   SpO2 93%   BMI 40.33 kg/m   Wt Readings from Last 3 Encounters:  07/17/17 199 lb (90.3 kg)  07/17/17 199 lb 11.2 oz (90.6 kg)  07/14/17 196 lb (88.9 kg)    Physical Exam Diabetic Foot Form - Detailed  Diabetic Foot Exam - detailed Diabetic Foot exam was performed with the following findings:  Yes 07/17/2017 11:46 AM  Visual Foot Exam completed.:  Yes  Pulse Foot Exam completed.:  Yes  Semmes-Weinstein Monofilament Test   Comments:  Feet are dirty     Results for orders placed or performed in visit on 07/17/17  POCT Glucose (CBG)  Result Value Ref Range   POC Glucose 500+ 70 - 99 mg/dl      Assessment & Plan:   Problem List Items Addressed This Visit      Endocrine   Type 2 diabetes, uncontrolled, with neuropathy ([place]) - Primary    Blood sugar is too high to read here; she reports unusual breath, fatigue; will send to ER for evaluation, treatment; she should f/u with her endocrinologist about her uncontrolled diabetes      Relevant Orders   POCT Glucose (CBG) (Completed)    Other Visit Diagnoses    Needs flu shot       Rash       concerning for scabies; sending to ER for evaluation of too high to read glucose; no Rx prescribed here; will be evaluated in ER       Follow up plan: No Follow-up on file.  An after-visit summary was  printed and given to the patient at [place].  Please see the patient instructions which may contain other information and recommendations beyond what is mentioned above in the assessment and plan.  Meds ordered this encounter  Medications  . solifenacin (VESICARE) 10 MG tablet    Sig: Take 10 mg daily by mouth.  . lamoTRIgine (LAMICTAL) 200 MG tablet    Sig: Take 200 mg daily by mouth.  . hydrOXYzine (VISTARIL) 50 MG capsule    Sig: Take 50 mg daily by mouth.    Orders Placed This Encounter  Procedures  . POCT Glucose (CBG)

## 2017-07-17 NOTE — ED Provider Notes (Addendum)
[facility] Emergency Department Provider Note  Time seen: 6:36 PM  I have reviewed the triage vital signs and the nursing notes.   HISTORY  Chief Complaint Hyperglycemia    HPI [name] is a 51 y.o. female with a past medical history of bipolar, asthma, COPD, gastric reflux, hypertension, hyperlipidemia, diabetes, presents to the emergency department for a rash and high blood sugars.  According to the patient she went to her primary care doctor today for 3 or 4 days of rash to her upper back and neck.  States for the past 4 days she has had a rash which is very itchy to her upper back and neck and it appears to be spreading.  States while at her doctor's office her doctor checked her blood sugar which was over 600 and referred her to the emergency department.  Here the patient's blood sugar is 683 initially, down to 530 with a liter of fluids.  The patient states she has been urinating frequently.  Denies any recent illnesses fever, cough congestion abdominal pain vomiting or diarrhea.  States she was recently diagnosed with urinary tract infection but is almost done with a course of antibiotics for this.   Past Medical History:  Diagnosis Date  . Asthma   . Bipolar depression ([place])   . CHF (congestive heart failure) ([place])   . Chronic headaches   . COPD (chronic obstructive pulmonary disease) ([place])   . Diabetic neuropathy ([place])   . GERD (gastroesophageal reflux disease)   . Headache   . Heart attack ([place])   . Hyperlipidemia   . Hypertension   . Hypomagnesemia   . Hypothyroid   . Irregular heart rhythm   . Liver lesion    Favored to be benign on CT; MRI of abd w/contract in Aug 2016. Liver Biopsy-Negative, March 2016  . Morbid obesity ([place])   . OSA (obstructive sleep apnea)   . Pinched nerve    Back  . Pneumonia   . RLS (restless legs syndrome)   . Seasonal allergies   . Sleep apnea   . Type 2 diabetes, uncontrolled, with neuropathy ([place])  10/15/2015   Overview:  Microalbumin 7.7 12/2010: Hgb A1c 10.2% 12/2010 and 9.7% 03/2011, eye exam 10/2009 [facility] DM clinic   . Urinary incontinence   . Vitamin D deficiency     Patient Active Problem List   Diagnosis Date Noted  . Lobar pneumonia ([place]) 06/05/2017  . Multifocal pneumonia 05/27/2017  . Pleuritic chest pain 05/27/2017  . Hypertriglyceridemia 12/21/2016  . Pansinusitis 11/15/2016  . 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 Breast cancer screening 09/28/2016  . Urine frequency 09/28/2016  . Preventative health care 09/28/2016  . Fatigue 05/01/2016  . Urinary tract infection 03/14/2016  . Adenomatous colon polyp 10/15/2015  . Airway hyperreactivity 10/15/2015  . Bipolar affective disorder ([place]) 10/15/2015  . CN (constipation) 10/15/2015  . CAFL (chronic airflow limitation) ([place]) 10/15/2015  . Drug-induced hepatic toxicity 10/15/2015  . Blood in feces 10/15/2015  . HLD (hyperlipidemia) 10/15/2015  . Hypoxemia 10/15/2015  . NASH (nonalcoholic steatohepatitis) 10/15/2015  . Morbid obesity ([place]) 10/15/2015  . Restless leg syndrome 10/15/2015  . Type 2 diabetes, uncontrolled, with neuropathy ([place]) 10/15/2015  . Elevated alkaline phosphatase level 09/22/2015  . Urinary bladder incontinence 09/22/2015  . Low back pain with left-sided sciatica 08/18/2015  . Encounter for monitoring tricyclic antidepressant therapy 05/13/2015  . Bipolar disorder ([place]) 05/11/2015  . Liver disease 05/11/2015  .  Diabetes ([place]) 05/11/2015  . Apnea, sleep 05/05/2015  . Heart valve disease 05/05/2015  . Chronic kidney disease, stage III (moderate) (HCC) 03/15/2015  . Migraine 03/12/2015  . COPD (chronic obstructive pulmonary disease) ([place]) 01/20/2015  . 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 GERD (gastroesophageal reflux disease) 01/20/2015  . Hypothyroid   . Vitamin D deficiency   . Urinary incontinence   . Liver lesion   . Hypomagnesemia   . Obstructive sleep  apnea 10/20/2013  . PERIPHERAL NEUROPATHY 03/04/2009  . HEMORRHOIDS, EXTERNAL 09/03/2008  . ALLERGIC RHINITIS 07/04/2007  . ASTHMA, PERSISTENT, MODERATE 07/04/2007  . INTERSTITIAL CYSTITIS 07/04/2007  . HYPERTENSION, BENIGN ESSENTIAL 06/19/2007    Past Surgical History:  Procedure Laterality Date  . ABDOMINAL HYSTERECTOMY    . ABDOMINAL HYSTERECTOMY     Partial  . BLADDER SURGERY     x 2  . CHOLECYSTECTOMY    . LIVER BIOPSY  March 2016   Negative  . LUNG BIOPSY    . TUBAL LIGATION      Prior to Admission medications   Medication Sig Start Date End Date Taking? Authorizing Provider  ACCU-CHEK AVIVA PLUS test strip Check FSBS three times a day; LON 99 months, DX E11.40 05/10/16   Lada, Satira Anis, MD  ACCU-CHEK SOFTCLIX LANCETS lancets Check FSBS three times a day; LON 99 months, DX E11.40 05/10/16   Lada, Satira Anis, MD  albuterol (PROVENTIL HFA;VENTOLIN HFA) 108 (90 Base) MCG/ACT inhaler Inhale 2 puffs into the lungs every 4 (four) hours as needed for wheezing or shortness of breath. 06/05/17   Arnetha Courser, MD  alum & mag hydroxide-simeth (MAALOX REGULAR STRENGTH) 200-200-20 MG/5ML suspension Take 15 mLs by mouth every 6 (six) hours as needed for indigestion or heartburn. Patient not taking: Reported on 07/17/2017 06/26/17   Eula Listen, MD  amLODipine (NORVASC) 2.5 MG tablet Take 1 tablet (2.5 mg total) by mouth daily. 06/15/17   Arnetha Courser, MD  aspirin EC 81 MG tablet Take 1 tablet (81 mg total) by mouth daily. 11/03/16   Arnetha Courser, MD  atorvastatin (LIPITOR) 40 MG tablet Take 1 tablet (40 mg total) by mouth at bedtime. 04/13/17   Arnetha Courser, MD  budesonide-formoterol (SYMBICORT) 160-4.5 MCG/ACT inhaler Inhale 2 puffs into the lungs 2 (two) times daily. Every day; rinse mouth out after each use 06/05/17   Lada, Satira Anis, MD  cephALEXin (KEFLEX) 500 MG capsule Take 1 capsule (500 mg total) 2 (two) times daily for 10 days by mouth. 07/15/17 07/25/17  Arta Silence, MD  clobetasol cream (TEMOVATE) 6.96 % Apply 1 application topically 2 (two) times daily. If needed; too strong for face, underarms, groin, children 02/02/17   Lada, Satira Anis, MD  DULoxetine (CYMBALTA) 60 MG capsule Take 120 mg by mouth daily.    [provider]  ezetimibe (ZETIA) 10 MG tablet Take 10 mg by mouth daily.  01/05/17   [provider]  famotidine (PEPCID) 20 MG tablet Take 1 tablet (20 mg total) by mouth 2 (two) times daily. 06/26/17 06/26/18  Eula Listen, MD  fluticasone (FLONASE) 50 MCG/ACT nasal spray Place 2 sprays into both nostrils daily. 01/09/17 01/09/18  Arnetha Courser, MD  gabapentin (NEURONTIN) 300 MG capsule Take 1 capsule (300 mg total) by mouth 2 (two) times daily. Patient not taking: Reported on 07/17/2017 04/11/17   Arnetha Courser, MD  gabapentin (NEURONTIN) 600 MG tablet Take 1 tablet (600 mg total) by mouth 2 (two)  times daily. 06/19/17   Edrick Kins, DPM  HUMULIN R U-500 KWIKPEN 500 UNIT/ML kwikpen 60 Units 3 (three) times daily with meals.  12/06/16   [provider]  hydrOXYzine (VISTARIL) 50 MG capsule Take 50 mg daily by mouth. 06/14/17   [provider]  Icosapent Ethyl (VASCEPA) 1 g CAPS Two pills by mouth twice a day for cholesterol (this replaces Lovaza) 09/29/16   Lada, Satira Anis, MD  insulin aspart (NOVOLOG) 100 UNIT/ML injection Short-acting insulin TID ac as follows: 70-150=30units 151-200=35units 201-250=40units 251-300=45units 301-350=50units 351-400=55units 401-450=60units Patient taking differently: Inject 30-60 Units into the skin 3 (three) times daily with meals. Short-acting insulin TID ac as follows: 70-150=30units 151-200=35units 201-250=40units 251-300=45units 301-350=50units 351-400=55units 401-450=60units 09/29/16   Lada, Satira Anis, MD  Insulin Syringe-Needle U-100 (ULTICARE INSULIN SYRINGE) 31G X 1/4" 0.3 ML MISC Inject 1 each into the skin 5 (five) times daily as needed. 09/22/16    Lada, Satira Anis, MD  JARDIANCE 25 MG TABS tablet Take 25 mg by mouth daily. 02/26/17   [provider]  lamoTRIgine (LAMICTAL) 150 MG tablet Take 150 mg by mouth daily.  03/22/15   [provider]  lamoTRIgine (LAMICTAL) 200 MG tablet Take 200 mg daily by mouth. 06/14/17   [provider]  levothyroxine (SYNTHROID, LEVOTHROID) 75 MCG tablet Take 1 tablet (75 mcg total) by mouth daily. 10/19/16   Arnetha Courser, MD  linaclotide Rolan Lipa) 72 MCG capsule Take 1 capsule (72 mcg total) by mouth daily before breakfast. 08/21/16   [name], MD  magnesium oxide (MAG-OX) 400 MG tablet Take 1 tablet (400 mg total) by mouth 3 (three) times daily. 11/15/16   Arnetha Courser, MD  metoprolol tartrate (LOPRESSOR) 25 MG tablet Take 0.5 tablets (12.5 mg total) by mouth 2 (two) times daily. 04/20/17   Arnetha Courser, MD  Multiple Vitamin (MULTIVITAMIN) capsule Take 1 capsule by mouth daily.     [provider]  ondansetron (ZOFRAN ODT) 4 MG disintegrating tablet Take 1 tablet (4 mg total) by mouth every 8 (eight) hours as needed for nausea or vomiting. 10/28/16   Earleen Newport, MD  oxyCODONE-acetaminophen (ROXICET) 5-325 MG tablet Take 1 tablet every 6 (six) hours as needed by mouth for moderate pain or severe pain. 07/14/17 08/13/17  Arta Silence, MD  pantoprazole (PROTONIX) 40 MG tablet Take 1 tablet (40 mg total) by mouth daily. 08/21/16   [name], MD  QUEtiapine (SEROQUEL) 25 MG tablet Take 25 mg by mouth 2 (two) times daily.     [provider]  QUEtiapine (SEROQUEL) 400 MG tablet Take 800 mg by mouth at bedtime.     [provider]  ranitidine (ZANTAC) 300 MG tablet Take 1 tablet (300 mg total) by mouth at bedtime. 05/15/16   Arnetha Courser, MD  solifenacin (VESICARE) 10 MG tablet Take 10 mg daily by mouth. 02/28/13   [provider]  zolpidem (AMBIEN) 10 MG tablet Take 10 mg by mouth at bedtime.  07/26/15   [provider]     Allergies  Allergen Reactions  . Morphine And Related     anaphylaxis  . Codeine     REACTION: itch, rash  . Codeine     itching  . Hydrocodone-Acetaminophen     REACTION: rash  . Mirtazapine     REACTION: closes throat  . Morphine And Related   . Sulfamethoxazole-Trimethoprim     REACTION: [place] all over  . Vicodin [Hydrocodone-Acetaminophen]  itching    Family History  Problem Relation Age of Onset  . Diabetes Mother   . Heart disease Mother   . Hyperlipidemia Mother   . Hypertension Mother   . Kidney disease Mother   . Mental illness Mother   . Hypothyroidism Mother   . Stroke Mother   . Osteoporosis Mother   . Glaucoma Mother   . Congestive Heart Failure Mother   . Hypertension Father   . Asthma Daughter   . Cancer Daughter        throat  . Bipolar disorder Daughter   . Heart disease Maternal Grandfather   . Rheum arthritis Maternal Grandfather   . Cancer Maternal Grandfather        liver  . Kidney disease Maternal Grandfather   . Cancer Paternal Grandmother        lung  . Kidney disease Brother   . Bipolar disorder Daughter   . Hypertension Daughter   . Restless legs syndrome Daughter     Social History Social History   Tobacco Use  . Smoking status: Never Smoker  . Smokeless tobacco: Never Used  Substance Use Topics  . Alcohol use: No  . Drug use: No    Review of Systems Constitutional: Negative for fever. ENT: Negative for congestion Cardiovascular: Negative for chest pain. Respiratory: Negative for shortness of breath. Gastrointestinal: Negative for abdominal pain, vomiting and diarrhea. Genitourinary: Positive for urinary frequency Skin: Patient states rash to bilateral upper back and neck Neurological: Negative for headache All other ROS negative  ____________________________________________   PHYSICAL EXAM:  VITAL SIGNS: ED Triage Vitals  Enc Vitals Group     BP 07/17/17 1413 98/69     Pulse Rate 07/17/17 1413 97      Resp 07/17/17 1413 18     Temp 07/17/17 1413 98.3 F (36.8 C)     Temp Source 07/17/17 1413 Oral     SpO2 07/17/17 1413 96 %     Weight 07/17/17 1413 199 lb (90.3 kg)     Height 07/17/17 1413 4' 11"  (1.499 m)     Head Circumference --      Peak Flow --      Pain Score 07/17/17 1412 0     Pain Loc --      Pain Edu? --      Excl. in [place]? --    Constitutional: Alert and oriented. Well appearing and in no distress. Eyes: Normal exam ENT   Head: Normocephalic and atraumatic.   Mouth/Throat: Mucous membranes are moist. Cardiovascular: Normal rate, regular rhythm. No murmur Respiratory: Normal respiratory effort without tachypnea nor retractions. Breath sounds are clear Gastrointestinal: Soft and nontender. No distention. Musculoskeletal: Nontender with normal range of motion in all extremities. Neurologic:  Normal speech and language. No gross focal neurologic deficit. Skin:  Skin is warm, dry.  Patient does have several lesions back/shoulders as well as neck and inferior scalp.  Lesions are suggestive of possible scabies. Psychiatric: Mood and affect are normal.  ____________________________________________    INITIAL IMPRESSION / ASSESSMENT AND PLAN / ED COURSE  Pertinent labs & imaging results that were available during my care of the patient were reviewed by me and considered in my medical decision making (see chart for details).  Patient presents to the emergency department for hyperglycemia referred from her PCP where she is being seen for a rash.  Differential includes scabies, insect bites, bedbugs, hyperglycemia, DKA, HHS.  Overall the patient appears very well, no distress lying  in bed comfortably watching TV.  On my examination the rash is most consistent with scabies.  We will treat with permethrin cream.  As far as the patient's hyperglycemia she is currently down to 530 after 1 liter of fluids.  We will dose 8 units of insulin and continue to closely monitor.   Patient's anion gap is closed at 15.  We will continue with IV hydration and recheck her fingerstick.  As long as the patient's fingerstick comes down below 300 patient can safely be discharged home with PCP follow-up and permethrin for her rash.  Patient agreeable to this plan of care.  I discussed permethrin treatment with the patient as well as washing all of her clothes and bedding in hot water with high heat to dry.  Patient agreeable to plan.  I have reviewed the patient's records including several visits for pain and hypoglycemia in the past.  Prior to insulin administration the patient's blood glucose has come down to 284 with fluids alone.  Patient states she is due for her nightly subcutaneous insulin and is requesting discharge home.  We will discharge the patient from the ED she would take your insulin as prescribed when she gets home.  We will prescribe permethrin for possible scabies.  Patient agreeable to this plan of care,  and will follow up with her doctor.  ____________________________________________   FINAL CLINICAL IMPRESSION(S) / ED DIAGNOSES  Hyperglycemia Scabies    Harvest Dark, MD 07/17/17 1761    Harvest Dark, MD 07/17/17 [phone]

## 2017-07-24 NOTE — Assessment & Plan Note (Signed)
Blood sugar is too high to read here; she reports unusual breath, fatigue; will send to ER for evaluation, treatment; she should f/u with her endocrinologist about her uncontrolled diabetes

## 2017-07-31 ENCOUNTER — Telehealth: Payer: Self-pay | Admitting: Family Medicine

## 2017-07-31 NOTE — Telephone Encounter (Signed)
Please ask patient to have her mammogram done soon Early detection of breast cancer saves lives Thank you

## 2017-08-01 NOTE — Telephone Encounter (Signed)
Pt notified, states she will schedule

## 2017-08-09 IMAGING — CT CT ABD-PELV W/ CM
1 of 3 series · 14 of 32 positions shown, 19 images · IV contrast (iopamidol)
Comparison: 11/19/2014

CLINICAL DATA: Left lower quadrant abdominal pain.

EXAM:
CT ABDOMEN AND PELVIS WITH CONTRAST
TECHNIQUE: Multidetector CT imaging of the abdomen and pelvis was performed
using the standard protocol following bolus administration of
intravenous contrast.
CONTRAST:  100mL 5FW4FI-PMM IOPAMIDOL (5FW4FI-PMM) INJECTION 61%

[Series 2: routine abd pel with · axial · 0.80mm/px · z∈[-884,-454]mm · 14 of 98 slices shown, 19 images]
[im 6/98  soft-tissue]
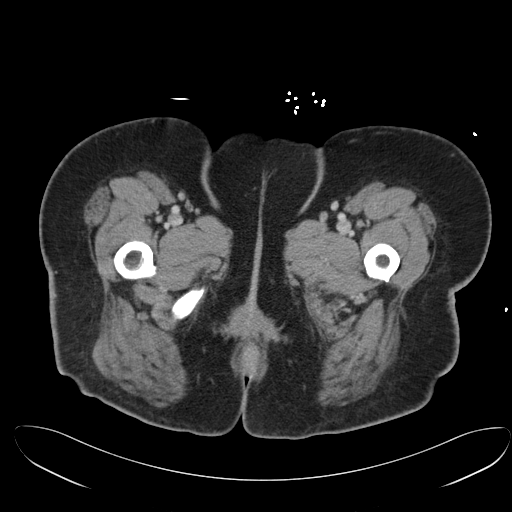
[im 6/98  bone]
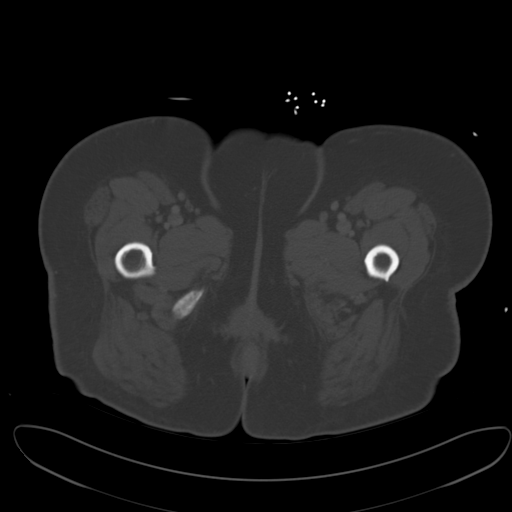
[im 11/98  soft-tissue]
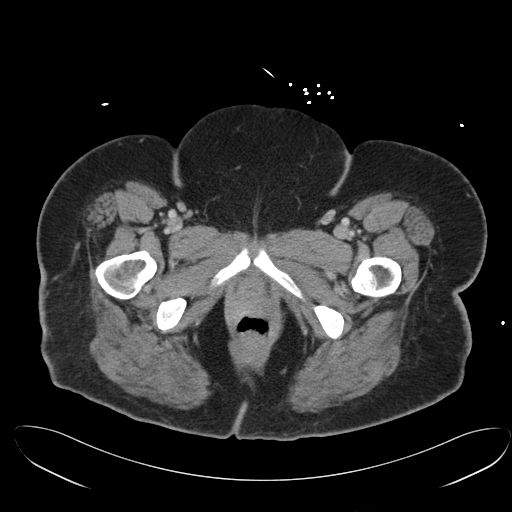
[im 22/98  soft-tissue]
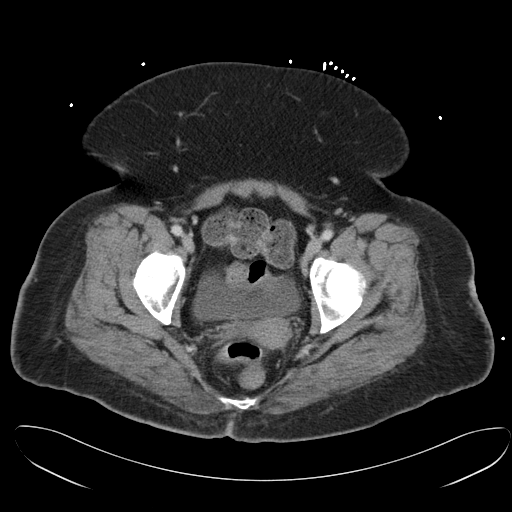
[im 27/98  soft-tissue]
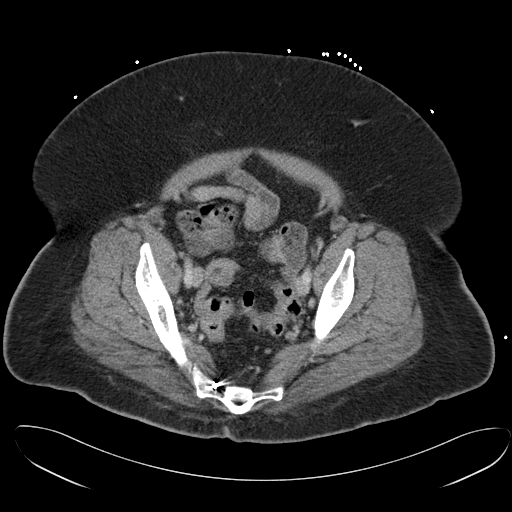
[im 33/98  soft-tissue]
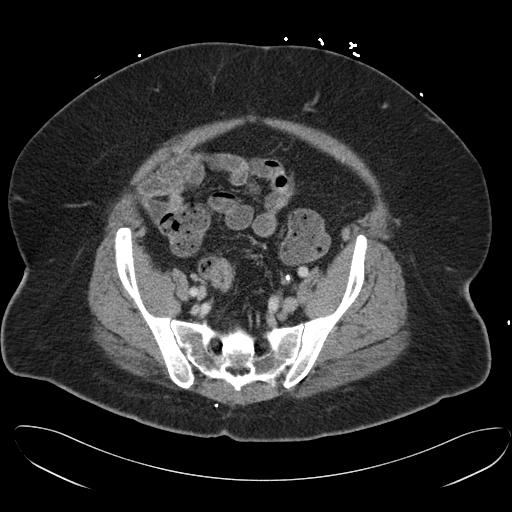
[im 44/98  soft-tissue]
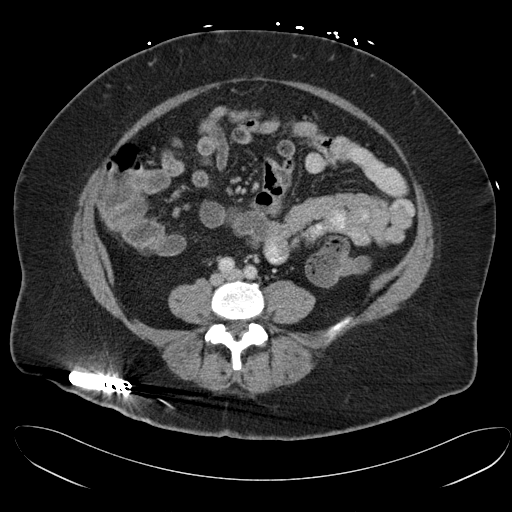
[im 49/98  soft-tissue]
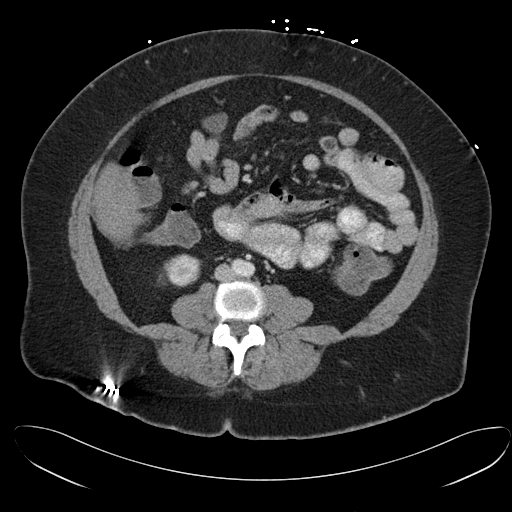
[im 54/98  soft-tissue]
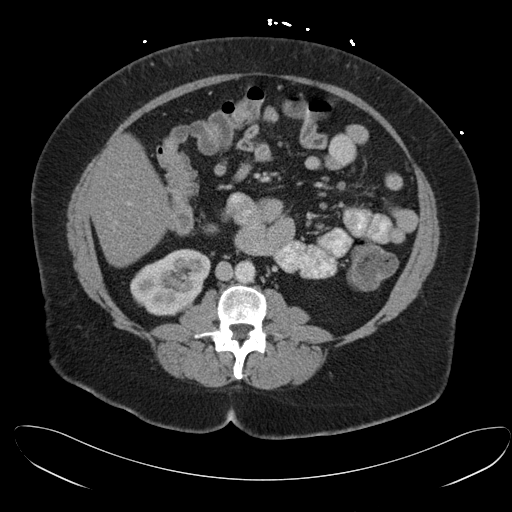
[im 65/98  soft-tissue]
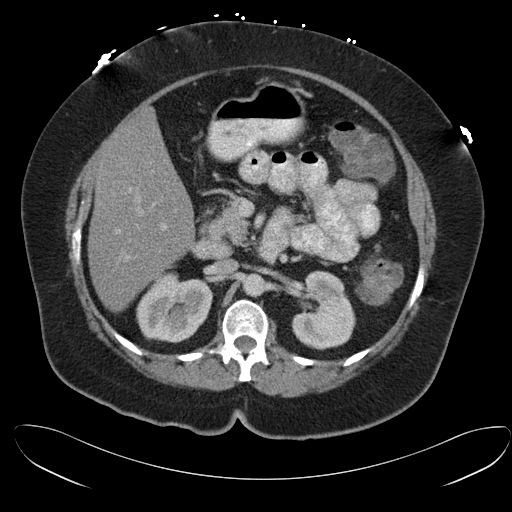
[im 65/98  bone]
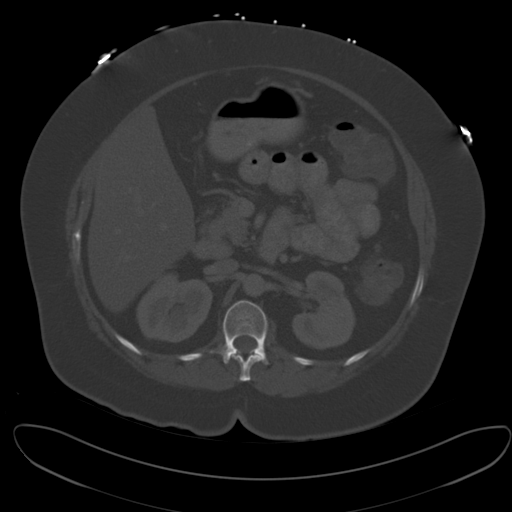
[im 71/98  soft-tissue]
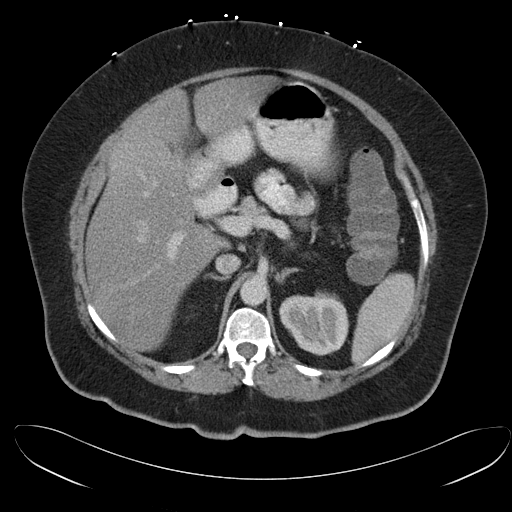
[im 76/98  soft-tissue]
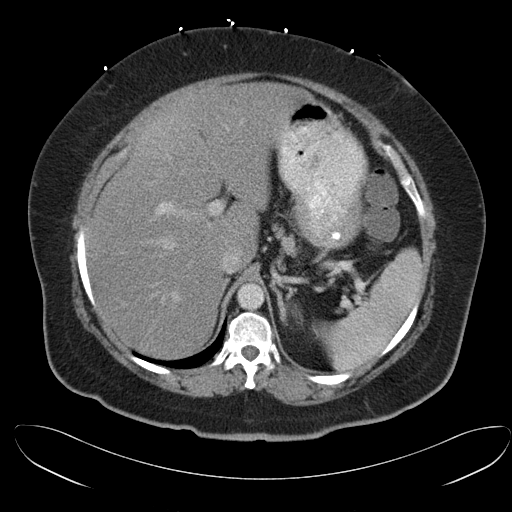
[im 76/98  lung]
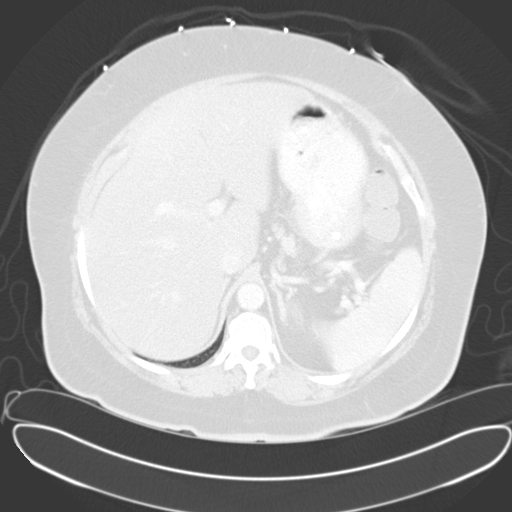
[im 81/98  lung]
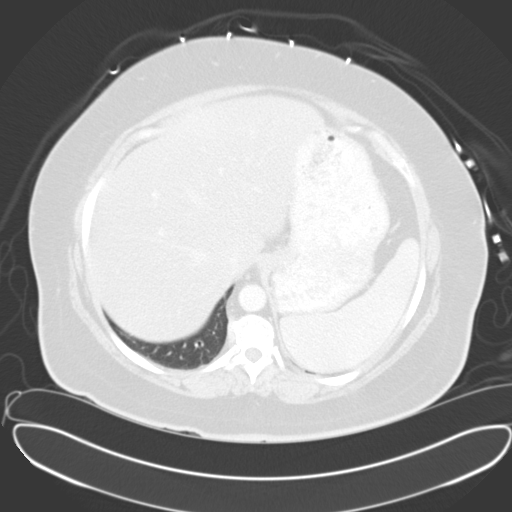
[im 87/98  soft-tissue]
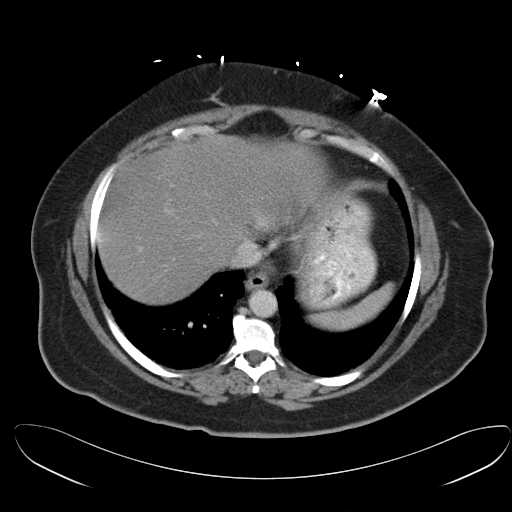
[im 87/98  lung]
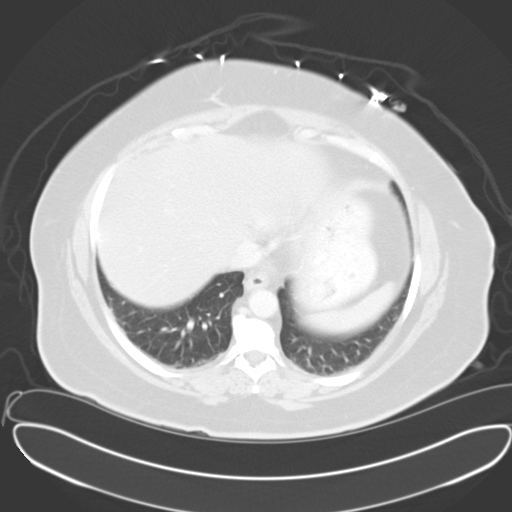
[im 92/98  soft-tissue]
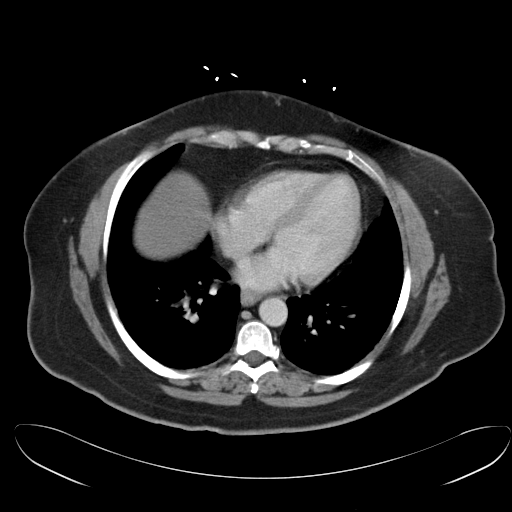
[im 92/98  lung]
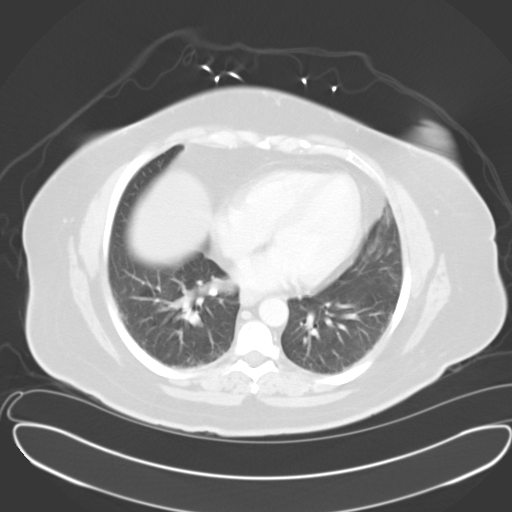

[14 of 32 positions shown; findings below may reference images not displayed]

FINDINGS: Lung bases: Heart borderline enlarged. Lung bases essentially clear.

Hepatobiliary: Mild hepatomegaly. Diffuse fatty infiltration of the
liver. No liver mass or focal lesion. Gallbladder surgically absent.
No bile duct dilation.

Spleen, pancreas, adrenal glands:  Normal.

Kidneys, ureters, bladder:  Unremarkable.

Uterus and adnexa: Uterus surgically absent. Prominent vaginal cuff
or potentially residual cervix, stable. No adnexal masses.

Lymph nodes:  No adenopathy.

Ascites:  None.

Gastrointestinal: Stomach, small bowel and colon are unremarkable.
Appendix not visualized. No evidence of appendicitis.

Musculoskeletal:  No osteoblastic or osteolytic lesions.
IMPRESSION: 1. No acute findings within the abdomen pelvis. No findings to
explain left lower quadrant pain.
2. Mild hepatomegaly and diffuse hepatic steatosis stable from prior
exam.
3. Status post cholecystectomy and hysterectomy.

## 2017-08-16 ENCOUNTER — Encounter: Payer: Self-pay | Admitting: Family Medicine

## 2017-08-16 ENCOUNTER — Ambulatory Visit (INDEPENDENT_AMBULATORY_CARE_PROVIDER_SITE_OTHER): Payer: Medicare HMO | Admitting: Family Medicine

## 2017-08-16 VITALS — BP 110/66 | HR 110 | Temp 98.3°F | Resp 18 | Ht 59.0 in | Wt 199.7 lb

## 2017-08-16 DIAGNOSIS — N898 Other specified noninflammatory disorders of vagina: Secondary | ICD-10-CM | POA: Diagnosis not present

## 2017-08-16 DIAGNOSIS — E1140 Type 2 diabetes mellitus with diabetic neuropathy, unspecified: Secondary | ICD-10-CM | POA: Diagnosis not present

## 2017-08-16 DIAGNOSIS — R300 Dysuria: Secondary | ICD-10-CM

## 2017-08-16 DIAGNOSIS — E1165 Type 2 diabetes mellitus with hyperglycemia: Secondary | ICD-10-CM

## 2017-08-16 DIAGNOSIS — J0111 Acute recurrent frontal sinusitis: Secondary | ICD-10-CM | POA: Diagnosis not present

## 2017-08-16 DIAGNOSIS — IMO0002 Reserved for concepts with insufficient information to code with codable children: Secondary | ICD-10-CM

## 2017-08-16 LAB — POCT URINALYSIS DIPSTICK
BILIRUBIN UA: NEGATIVE
Ketones, UA: 5
LEUKOCYTES UA: NEGATIVE
NITRITE UA: NEGATIVE
PH UA: 6 (ref 5.0–8.0)
Spec Grav, UA: 1.01 (ref 1.010–1.025)
UROBILINOGEN UA: 0.2 U/dL

## 2017-08-16 MED ORDER — AMOXICILLIN-POT CLAVULANATE 875-125 MG PO TABS: 1.0000 | ORAL_TABLET | Freq: Two times a day (BID) | ORAL | 0 refills | Status: DC

## 2017-08-16 NOTE — Progress Notes (Signed)
Name: [name]   MRN: [id number]    DOB: 10-18-1965   Date:08/16/2017       Progress Note  Subjective  Chief Complaint  Chief Complaint  Patient presents with  . Urinary Tract Infection    patient stated that her sx started about 2 days ago with right lower abdominal pain, back pain and burning upon urination. otc yeast medication   . Sinusitis    left ear pain, facial pain and pressure on left side since yesterday. post nasal drip and sore throat. no otc meds taken.    HPI  Sinus Concerns:  LEFT ear pain radiating into jaw, post-nasal drip, nasal congestion, pain in frontal and maxillary sinuses, mildly sore throat x1 day.  Denies shortness of breath, fevers/chills, nausea/vomiting/diarrhea, no abdominal pain aside from pain described below.  Takes Metoprolol for palpitations - has had a few palpitations in the last 24 hours; heartrate is elevated today at 121bpm.  Has been using flonase, taking benadryl at night only.  Has hx pansinusitis in the past requiring ENT specialty intervention.    Urinary Symptoms: Vaginal burning, pain with urination, LEFT low pelvis and flank pain, urinary frequency x3 days.  She has been using OTC yeast infection medication x3 days without relief of symptoms. She is concerned that she has another UTI.   No history of kidney stones; denies abnormal vaginal discharge or bleeding, no new sexual partners - is sexually active and monogamous x19 years (declines gc/chlamydia testing today), no NVD.  UA reveals ketones and glucose, CBG (non-fasting ) is 486 in office today - she ate lunch and did not take her meal-time insulin (did take AM insulin).  Endorses polydipsia and polyphagia as well.  Sees Dr. Alfredo Batty for DM management.   Patient Active Problem List   Diagnosis Date Noted  . Lobar pneumonia ([place]) 06/05/2017  . Multifocal pneumonia 05/27/2017  . Pleuritic chest pain 05/27/2017  . Hypertriglyceridemia 12/21/2016  . Pansinusitis 11/15/2016  . 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 Breast cancer screening 09/28/2016  . Urine frequency 09/28/2016  . Preventative health care 09/28/2016  . Fatigue 05/01/2016  . Urinary tract infection 03/14/2016  . Adenomatous colon polyp 10/15/2015  . Airway hyperreactivity 10/15/2015  . Bipolar affective disorder ([place]) 10/15/2015  . CN (constipation) 10/15/2015  . CAFL (chronic airflow limitation) ([place]) 10/15/2015  . Drug-induced hepatic toxicity 10/15/2015  . Blood in feces 10/15/2015  . HLD (hyperlipidemia) 10/15/2015  . Hypoxemia 10/15/2015  . NASH (nonalcoholic steatohepatitis) 10/15/2015  . Morbid obesity ([place]) 10/15/2015  . Restless leg syndrome 10/15/2015  . Type 2 diabetes, uncontrolled, with neuropathy ([place]) 10/15/2015  . Elevated alkaline phosphatase level 09/22/2015  . Urinary bladder incontinence 09/22/2015  . Low back pain with left-sided sciatica 08/18/2015  . Encounter for monitoring tricyclic antidepressant therapy 05/13/2015  . Bipolar disorder ([place]) 05/11/2015  . Liver disease 05/11/2015  . Apnea, sleep 05/05/2015  . Heart valve disease 05/05/2015  . Chronic kidney disease, stage III (moderate) (H[facility]) 03/15/2015  . Migraine 03/12/2015  . COPD (chronic obstructive pulmonary disease) ([place]) 01/20/2015  . 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 GERD (gastroesophageal reflux disease) 01/20/2015  . Hypothyroid   . Vitamin D deficiency   . Urinary incontinence   . Liver lesion   . Hypomagnesemia   . Obstructive sleep apnea 10/20/2013  . PERIPHERAL NEUROPATHY 03/04/2009  . HEMORRHOIDS, EXTERNAL 09/03/2008  . ALLERGIC RHINITIS 07/04/2007  . ASTHMA, PERSISTENT, MODERATE 07/04/2007  .  INTERSTITIAL CYSTITIS 07/04/2007  . HYPERTENSION, BENIGN ESSENTIAL 06/19/2007    Social History   Tobacco Use  . Smoking status: Never Smoker  . Smokeless tobacco: Never Used  Substance Use Topics  . Alcohol use: No     Current Outpatient Medications:  .   ACCU-CHEK AVIVA PLUS test strip, Check FSBS three times a day; LON 99 months, DX E11.40, Disp: 100 each, Rfl: 3 .  ACCU-CHEK SOFTCLIX LANCETS lancets, Check FSBS three times a day; LON 99 months, DX E11.40, Disp: 100 each, Rfl: 3 .  albuterol (PROVENTIL HFA;VENTOLIN HFA) 108 (90 Base) MCG/ACT inhaler, Inhale 2 puffs into the lungs every 4 (four) hours as needed for wheezing or shortness of breath., Disp: 1 Inhaler, Rfl: 1 .  alum & mag hydroxide-simeth (MAALOX REGULAR STRENGTH) 200-200-20 MG/5ML suspension, Take 15 mLs by mouth every 6 (six) hours as needed for indigestion or heartburn. (Patient not taking: Reported on 07/17/2017), Disp: 150 mL, Rfl: 0 .  amLODipine (NORVASC) 2.5 MG tablet, Take 1 tablet (2.5 mg total) by mouth daily., Disp: 30 tablet, Rfl: 11 .  amoxicillin-clavulanate (AUGMENTIN) 875-125 MG tablet, Take 1 tablet by mouth 2 (two) times daily for 10 days., Disp: 20 tablet, Rfl: 0 .  aspirin EC 81 MG tablet, Take 1 tablet (81 mg total) by mouth daily., Disp: , Rfl:  .  atorvastatin (LIPITOR) 40 MG tablet, Take 1 tablet (40 mg total) by mouth at bedtime., Disp: 90 tablet, Rfl: 1 .  budesonide-formoterol (SYMBICORT) 160-4.5 MCG/ACT inhaler, Inhale 2 puffs into the lungs 2 (two) times daily. Every day; rinse mouth out after each use, Disp: 1 Inhaler, Rfl: 11 .  clobetasol cream (TEMOVATE) 5.28 %, Apply 1 application topically 2 (two) times daily. If needed; too strong for face, underarms, groin, children, Disp: 30 g, Rfl: 0 .  DULoxetine (CYMBALTA) 60 MG capsule, Take 120 mg by mouth daily., Disp: , Rfl:  .  ezetimibe (ZETIA) 10 MG tablet, Take 10 mg by mouth daily. , Disp: , Rfl:  .  famotidine (PEPCID) 20 MG tablet, Take 1 tablet (20 mg total) by mouth 2 (two) times daily., Disp: 60 tablet, Rfl: 0 .  fluticasone (FLONASE) 50 MCG/ACT nasal spray, Place 2 sprays into both nostrils daily., Disp: 16 g, Rfl: 11 .  gabapentin (NEURONTIN) 300 MG capsule, Take 1 capsule (300 mg total) by mouth 2  (two) times daily. (Patient not taking: Reported on 07/17/2017), Disp: 60 capsule, Rfl: 5 .  gabapentin (NEURONTIN) 600 MG tablet, Take 1 tablet (600 mg total) by mouth 2 (two) times daily., Disp: 60 tablet, Rfl: 2 .  HUMULIN R U-500 KWIKPEN 500 UNIT/ML kwikpen, 60 Units 3 (three) times daily with meals. , Disp: , Rfl:  .  hydrOXYzine (VISTARIL) 50 MG capsule, Take 50 mg daily by mouth., Disp: , Rfl:  .  Icosapent Ethyl (VASCEPA) 1 g CAPS, Two pills by mouth twice a day for cholesterol (this replaces Lovaza), Disp: 120 capsule, Rfl: 11 .  insulin aspart (NOVOLOG) 100 UNIT/ML injection, Short-acting insulin TID ac as follows: 70-150=30units 151-200=35units 201-250=40units 251-300=45units 301-350=50units 351-400=55units 401-450=60units (Patient taking differently: Inject 30-60 Units into the skin 3 (three) times daily with meals. Short-acting insulin TID ac as follows: 70-150=30units 151-200=35units 201-250=40units 251-300=45units 301-350=50units 351-400=55units 401-450=60units), Disp: 5 vial, Rfl: 1 .  Insulin Syringe-Needle U-100 (ULTICARE INSULIN SYRINGE) 31G X 1/4" 0.3 ML MISC, Inject 1 each into the skin 5 (five) times daily as needed., Disp: 200 each, Rfl: 0 .  JARDIANCE 25 MG TABS  tablet, Take 25 mg by mouth daily., Disp: , Rfl:  .  lamoTRIgine (LAMICTAL) 150 MG tablet, Take 150 mg by mouth daily. , Disp: , Rfl:  .  lamoTRIgine (LAMICTAL) 200 MG tablet, Take 200 mg daily by mouth., Disp: , Rfl:  .  levothyroxine (SYNTHROID, LEVOTHROID) 75 MCG tablet, Take 1 tablet (75 mcg total) by mouth daily., Disp: 30 tablet, Rfl: 11 .  linaclotide (LINZESS) 72 MCG capsule, Take 1 capsule (72 mcg total) by mouth daily before breakfast., Disp: 90 capsule, Rfl: 3 .  magnesium oxide (MAG-OX) 400 MG tablet, Take 1 tablet (400 mg total) by mouth 3 (three) times daily., Disp: 90 tablet, Rfl: 2 .  metoprolol tartrate (LOPRESSOR) 25 MG tablet, Take 0.5 tablets (12.5 mg total) by mouth 2 (two) times daily., Disp: 30  tablet, Rfl: 11 .  Multiple Vitamin (MULTIVITAMIN) capsule, Take 1 capsule by mouth daily. , Disp: , Rfl:  .  ondansetron (ZOFRAN ODT) 4 MG disintegrating tablet, Take 1 tablet (4 mg total) by mouth every 8 (eight) hours as needed for nausea or vomiting., Disp: 20 tablet, Rfl: 0 .  pantoprazole (PROTONIX) 40 MG tablet, Take 1 tablet (40 mg total) by mouth daily., Disp: 90 tablet, Rfl: 3 .  permethrin (ELIMITE) 5 % cream, Apply to body, leave in place for 8 hours and then shower clean., Disp: 60 g, Rfl: 1 .  QUEtiapine (SEROQUEL) 25 MG tablet, Take 25 mg by mouth 2 (two) times daily. , Disp: , Rfl:  .  QUEtiapine (SEROQUEL) 400 MG tablet, Take 800 mg by mouth at bedtime. , Disp: , Rfl:  .  ranitidine (ZANTAC) 300 MG tablet, Take 1 tablet (300 mg total) by mouth at bedtime., Disp: 30 tablet, Rfl: 5 .  solifenacin (VESICARE) 10 MG tablet, Take 10 mg daily by mouth., Disp: , Rfl:  .  zolpidem (AMBIEN) 10 MG tablet, Take 10 mg by mouth at bedtime. , Disp: , Rfl:   Current Facility-Administered Medications:  .  betamethasone acetate-betamethasone sodium phosphate (CELESTONE) injection 3 mg, 3 mg, Intramuscular, Once, Evans, Brent M, DPM .  betamethasone acetate-betamethasone sodium phosphate (CELESTONE) injection 3 mg, 3 mg, Intramuscular, Once, Edrick Kins, DPM  Allergies  Allergen Reactions  . Morphine And Related     anaphylaxis  . Codeine     REACTION: itch, rash  . Codeine     itching  . Hydrocodone-Acetaminophen     REACTION: rash  . Mirtazapine     REACTION: closes throat  . Morphine And Related   . Sulfamethoxazole-Trimethoprim     REACTION: [place] all over  . Vicodin [Hydrocodone-Acetaminophen]     itching    ROS Ten systems reviewed and is negative except as mentioned in HPI  Objective  Vitals:   08/16/17 1414 08/16/17 1523  BP: 110/66   Pulse: (!) 121 (!) 110  Resp: 18   Temp: 98.3 F (36.8 C)   TempSrc: Oral   SpO2: 97%   Weight: 199 lb 11.2 oz (90.6 kg)    Height: _0  (1.499 m)    Body mass index is 40.33 kg/m.  Nursing Note and Vital Signs reviewed.  Physical Exam Constitutional: Patient appears well-developed and well-nourished. Obese No distress.  HEENT: head atraumatic, normocephalic, pupils equal and reactive to light, EOM's intact, TM's without erythema or bulging, tenderness to LEFT maxillary and frontal sinus, neck supple with LEFT submandibular lymphadenopathy, oropharynx erythematous and moist without exudate Cardiovascular: Normal rate, regular rhythm, S1/S2 present.  No murmur or  rub heard. No BLE edema. Pulmonary/Chest: Effort normal and breath sounds clear. No respiratory distress or retractions. Abdominal: Soft and non-tender, bowel sounds present x4 quadrants.  No CVA Tenderness. Psychiatric: Patient has a normal mood and affect. behavior is normal. Judgment and thought content normal.  Recent Results (from the past 2160 hour(s))  Basic metabolic panel     Status: Abnormal   Collection Time: 05/27/17  9:11 AM  Result Value Ref Range   Sodium 136 135 - 145 mmol/L   Potassium 4.2 3.5 - 5.1 mmol/L   Chloride 100 (L) 101 - 111 mmol/L   CO2 23 22 - 32 mmol/L   Glucose, Bld 395 (H) 65 - 99 mg/dL   BUN 9 6 - 20 mg/dL   Creatinine, Ser 1.01 (H) 0.44 - 1.00 mg/dL   Calcium 8.9 8.9 - 10.3 mg/dL   GFR calc non Af Amer >60 >60 mL/min   GFR calc Af Amer >60 >60 mL/min    Comment: (NOTE) The eGFR has been calculated using the CKD EPI equation. This calculation has not been validated in all clinical situations. eGFR's persistently <60 mL/min signify possible Chronic Kidney Disease.    Anion gap 13 5 - 15  CBC     Status: None   Collection Time: 05/27/17  9:11 AM  Result Value Ref Range   WBC 7.1 3.6 - 11.0 K/uL   RBC 4.50 3.80 - 5.20 MIL/uL   Hemoglobin 13.2 12.0 - 16.0 g/dL   HCT 39.0 35.0 - 47.0 %   MCV 86.7 80.0 - 100.0 fL   MCH 29.4 26.0 - 34.0 pg   MCHC 33.9 32.0 - 36.0 g/dL   RDW 14.0 11.5 - 14.5 %   Platelets  179 150 - 440 K/uL  Troponin I     Status: None   Collection Time: 05/27/17  9:11 AM  Result Value Ref Range   Troponin I <0.03 <0.03 ng/mL  Troponin I     Status: None   Collection Time: 05/27/17 12:53 PM  Result Value Ref Range   Troponin I <0.03 <0.03 ng/mL  Culture, blood (routine x 2)     Status: None   Collection Time: 05/27/17  3:46 PM  Result Value Ref Range   Specimen Description BLOOD LEFT ANTECUBITAL    Special Requests      BOTTLES DRAWN AEROBIC AND ANAEROBIC Blood Culture results may not be optimal due to an excessive volume of blood received in culture bottles   Culture NO GROWTH 5 DAYS    Report Status 06/01/2017 FINAL   Culture, blood (routine x 2)     Status: None   Collection Time: 05/27/17  3:46 PM  Result Value Ref Range   Specimen Description BLOOD BLOOD RIGHT HAND    Special Requests      BOTTLES DRAWN AEROBIC AND ANAEROBIC Blood Culture adequate volume   Culture NO GROWTH 5 DAYS    Report Status 06/01/2017 FINAL   Glucose, capillary     Status: Abnormal   Collection Time: 05/27/17  5:36 PM  Result Value Ref Range   Glucose-Capillary 276 (H) 65 - 99 mg/dL  Glucose, capillary     Status: Abnormal   Collection Time: 05/27/17  8:57 PM  Result Value Ref Range   Glucose-Capillary 224 (H) 65 - 99 mg/dL  Comprehensive metabolic panel     Status: Abnormal   Collection Time: 05/28/17  3:10 AM  Result Value Ref Range   Sodium 137 135 - 145 mmol/L  Potassium 4.3 3.5 - 5.1 mmol/L   Chloride 99 (L) 101 - 111 mmol/L   CO2 27 22 - 32 mmol/L   Glucose, Bld 203 (H) 65 - 99 mg/dL   BUN 13 6 - 20 mg/dL   Creatinine, Ser 0.96 0.44 - 1.00 mg/dL   Calcium 9.0 8.9 - 10.3 mg/dL   Total Protein 6.5 6.5 - 8.1 g/dL   Albumin 3.7 3.5 - 5.0 g/dL   AST 30 15 - 41 U/L   ALT 31 14 - 54 U/L   Alkaline Phosphatase 82 38 - 126 U/L   Total Bilirubin 0.8 0.3 - 1.2 mg/dL   GFR calc non Af Amer >60 >60 mL/min   GFR calc Af Amer >60 >60 mL/min    Comment: (NOTE) The eGFR has been  calculated using the CKD EPI equation. This calculation has not been validated in all clinical situations. eGFR's persistently <60 mL/min signify possible Chronic Kidney Disease.    Anion gap 11 5 - 15  CBC     Status: None   Collection Time: 05/28/17  3:10 AM  Result Value Ref Range   WBC 6.3 3.6 - 11.0 K/uL   RBC 4.53 3.80 - 5.20 MIL/uL   Hemoglobin 13.3 12.0 - 16.0 g/dL   HCT 39.2 35.0 - 47.0 %   MCV 86.4 80.0 - 100.0 fL   MCH 29.3 26.0 - 34.0 pg   MCHC 33.9 32.0 - 36.0 g/dL   RDW 14.3 11.5 - 14.5 %   Platelets 182 150 - 440 K/uL  HIV antibody     Status: None   Collection Time: 05/28/17  3:10 AM  Result Value Ref Range   HIV Screen 4th Generation wRfx Non Reactive Non Reactive    Comment: (NOTE) Performed At: [facility] [address] [place], [place] [id number] Lindon Romp MD JH:[id number]   Glucose, capillary     Status: Abnormal   Collection Time: 05/28/17  7:45 AM  Result Value Ref Range   Glucose-Capillary 349 (H) 65 - 99 mg/dL  Glucose, capillary     Status: Abnormal   Collection Time: 05/28/17 11:54 AM  Result Value Ref Range   Glucose-Capillary 544 (HH) 65 - 99 mg/dL  Glucose, capillary     Status: Abnormal   Collection Time: 05/28/17  1:34 PM  Result Value Ref Range   Glucose-Capillary 599 (HH) 65 - 99 mg/dL   Comment 1 Notify RN   Glucose, capillary     Status: Abnormal   Collection Time: 05/28/17  4:22 PM  Result Value Ref Range   Glucose-Capillary 461 (H) 65 - 99 mg/dL  Glucose, capillary     Status: Abnormal   Collection Time: 05/28/17  9:10 PM  Result Value Ref Range   Glucose-Capillary 303 (H) 65 - 99 mg/dL  Glucose, capillary     Status: Abnormal   Collection Time: 05/29/17  8:06 AM  Result Value Ref Range   Glucose-Capillary 241 (H) 65 - 99 mg/dL  Glucose, capillary     Status: Abnormal   Collection Time: 05/29/17 11:52 AM  Result Value Ref Range   Glucose-Capillary 175 (H) 65 - 99 mg/dL  Basic metabolic panel     Status:  Abnormal   Collection Time: 06/06/17  3:53 PM  Result Value Ref Range   Sodium 132 (L) 135 - 145 mmol/L   Potassium 3.8 3.5 - 5.1 mmol/L   Chloride 97 (L) 101 - 111 mmol/L   CO2 25 22 - 32 mmol/L  Glucose, Bld 525 (HH) 65 - 99 mg/dL    Comment: CRITICAL RESULT CALLED TO, READ BACK BY AND VERIFIED WITH KENDALL MOFFITT 06/06/17 1644 KLW    BUN 20 6 - 20 mg/dL   Creatinine, Ser 1.17 (H) 0.44 - 1.00 mg/dL   Calcium 9.2 8.9 - 10.3 mg/dL   GFR calc non Af Amer 52 (L) >60 mL/min   GFR calc Af Amer >60 >60 mL/min    Comment: (NOTE) The eGFR has been calculated using the CKD EPI equation. This calculation has not been validated in all clinical situations. eGFR's persistently <60 mL/min signify possible Chronic Kidney Disease.    Anion gap 10 5 - 15  CBC     Status: Abnormal   Collection Time: 06/06/17  3:53 PM  Result Value Ref Range   WBC 11.3 (H) 3.6 - 11.0 K/uL   RBC 4.37 3.80 - 5.20 MIL/uL   Hemoglobin 12.6 12.0 - 16.0 g/dL   HCT 38.6 35.0 - 47.0 %   MCV 88.3 80.0 - 100.0 fL   MCH 28.8 26.0 - 34.0 pg   MCHC 32.6 32.0 - 36.0 g/dL   RDW 14.4 11.5 - 14.5 %   Platelets 279 150 - 440 K/uL  Urinalysis, Complete w Microscopic     Status: Abnormal   Collection Time: 06/06/17  3:53 PM  Result Value Ref Range   Color, Urine COLORLESS (A) YELLOW   APPearance CLEAR (A) CLEAR   Specific Gravity, Urine 1.024 1.005 - 1.030   pH 5.0 5.0 - 8.0   Glucose, UA >=500 (A) NEGATIVE mg/dL   Hgb urine dipstick NEGATIVE NEGATIVE   Bilirubin Urine NEGATIVE NEGATIVE   Ketones, ur NEGATIVE NEGATIVE mg/dL   Protein, ur NEGATIVE NEGATIVE mg/dL   Nitrite NEGATIVE NEGATIVE   Leukocytes, UA NEGATIVE NEGATIVE   RBC / HPF 0-5 0 - 5 RBC/hpf   WBC, UA 0-5 0 - 5 WBC/hpf   Bacteria, UA NONE SEEN NONE SEEN   Squamous Epithelial / LPF 0-5 (A) NONE SEEN  Hepatic function panel     Status: Abnormal   Collection Time: 06/06/17  3:53 PM  Result Value Ref Range   Total Protein 6.5 6.5 - 8.1 g/dL   Albumin 3.8  3.5 - 5.0 g/dL   AST 22 15 - 41 U/L   ALT 20 14 - 54 U/L   Alkaline Phosphatase 72 38 - 126 U/L   Total Bilirubin 0.7 0.3 - 1.2 mg/dL   Bilirubin, Direct <0.1 (L) 0.1 - 0.5 mg/dL   Indirect Bilirubin NOT CALCULATED 0.3 - 0.9 mg/dL  Lipase, blood     Status: None   Collection Time: 06/06/17  3:53 PM  Result Value Ref Range   Lipase 30 11 - 51 U/L  Pregnancy, urine     Status: None   Collection Time: 06/06/17  3:53 PM  Result Value Ref Range   Preg Test, Ur NEGATIVE NEGATIVE  Glucose, capillary     Status: Abnormal   Collection Time: 06/06/17  3:59 PM  Result Value Ref Range   Glucose-Capillary 523 (HH) 65 - 99 mg/dL  Blood gas, venous     Status: Abnormal   Collection Time: 06/06/17  4:25 PM  Result Value Ref Range   pH, Ven 7.43 7.250 - 7.430   pCO2, Ven 43 (L) 44.0 - 60.0 mmHg   pO2, Ven 56.0 (H) 32.0 - 45.0 mmHg   Bicarbonate 28.5 (H) 20.0 - 28.0 mmol/L   Acid-Base Excess 3.7 (H) 0.0 -  2.0 mmol/L   O2 Saturation 89.6 %   Patient temperature 37.0    Collection site VEIN    Sample type VEIN   Glucose, capillary     Status: None   Collection Time: 06/06/17  8:29 PM  Result Value Ref Range   Glucose-Capillary 92 65 - 99 mg/dL  Basic metabolic panel     Status: Abnormal   Collection Time: 06/26/17 12:42 PM  Result Value Ref Range   Sodium 136 135 - 145 mmol/L   Potassium 4.4 3.5 - 5.1 mmol/L   Chloride 97 (L) 101 - 111 mmol/L   CO2 24 22 - 32 mmol/L   Glucose, Bld 364 (H) 65 - 99 mg/dL   BUN 19 6 - 20 mg/dL   Creatinine, Ser 1.05 (H) 0.44 - 1.00 mg/dL   Calcium 9.7 8.9 - 10.3 mg/dL   GFR calc non Af Amer 60 (L) >60 mL/min   GFR calc Af Amer >60 >60 mL/min    Comment: (NOTE) The eGFR has been calculated using the CKD EPI equation. This calculation has not been validated in all clinical situations. eGFR's persistently <60 mL/min signify possible Chronic Kidney Disease.    Anion gap 15 5 - 15  CBC     Status: Abnormal   Collection Time: 06/26/17 12:42 PM  Result  Value Ref Range   WBC 11.5 (H) 3.6 - 11.0 K/uL   RBC 4.96 3.80 - 5.20 MIL/uL   Hemoglobin 14.3 12.0 - 16.0 g/dL   HCT 44.2 35.0 - 47.0 %   MCV 89.1 80.0 - 100.0 fL   MCH 28.7 26.0 - 34.0 pg   MCHC 32.2 32.0 - 36.0 g/dL   RDW 14.5 11.5 - 14.5 %   Platelets 238 150 - 440 K/uL  Troponin I     Status: None   Collection Time: 06/26/17 12:42 PM  Result Value Ref Range   Troponin I <0.03 <0.03 ng/mL  Fibrin derivatives D-Dimer ([facility] only)     Status: None   Collection Time: 06/26/17  2:49 PM  Result Value Ref Range   Fibrin derivatives D-dimer ([facility]) 255.73 0.00 - 499.00 ng/mL (FEU)    Comment: (NOTE) <> Exclusion of Venous Thromboembolism (VTE) - OUTPATIENT ONLY   (Emergency Department or [place])   0-499 ng/ml (FEU): With a low to intermediate pretest probability                      for VTE this test result excludes the diagnosis                      of VTE.   >499 ng/ml (FEU) : VTE not excluded; additional work up for VTE is                      required. <> Testing on Inpatients and Evaluation of Disseminated Intravascular   Coagulation ([facility]) Reference Range:   0-499 ng/ml (FEU)   Troponin I     Status: None   Collection Time: 06/26/17  2:49 PM  Result Value Ref Range   Troponin I <0.03 <0.03 ng/mL  Lipase, blood     Status: None   Collection Time: 07/14/17  2:32 PM  Result Value Ref Range   Lipase 21 11 - 51 U/L  Comprehensive metabolic panel     Status: Abnormal   Collection Time: 07/14/17  2:32 PM  Result Value Ref Range   Sodium 137 135 - 145  mmol/L   Potassium 3.7 3.5 - 5.1 mmol/L   Chloride 102 101 - 111 mmol/L   CO2 24 22 - 32 mmol/L   Glucose, Bld 254 (H) 65 - 99 mg/dL   BUN 12 6 - 20 mg/dL   Creatinine, Ser 1.01 (H) 0.44 - 1.00 mg/dL   Calcium 9.4 8.9 - 10.3 mg/dL   Total Protein 6.9 6.5 - 8.1 g/dL   Albumin 3.9 3.5 - 5.0 g/dL   AST 31 15 - 41 U/L   ALT 25 14 - 54 U/L   Alkaline Phosphatase 89 38 - 126 U/L   Total Bilirubin 0.6 0.3 - 1.2 mg/dL   GFR calc  non Af Amer >60 >60 mL/min   GFR calc Af Amer >60 >60 mL/min    Comment: (NOTE) The eGFR has been calculated using the CKD EPI equation. This calculation has not been validated in all clinical situations. eGFR's persistently <60 mL/min signify possible Chronic Kidney Disease.    Anion gap 11 5 - 15  CBC     Status: None   Collection Time: 07/14/17  2:32 PM  Result Value Ref Range   WBC 7.8 3.6 - 11.0 K/uL   RBC 4.73 3.80 - 5.20 MIL/uL   Hemoglobin 13.7 12.0 - 16.0 g/dL   HCT 41.2 35.0 - 47.0 %   MCV 87.0 80.0 - 100.0 fL   MCH 28.9 26.0 - 34.0 pg   MCHC 33.2 32.0 - 36.0 g/dL   RDW 14.2 11.5 - 14.5 %   Platelets 264 150 - 440 K/uL  Urinalysis, Complete w Microscopic     Status: Abnormal   Collection Time: 07/14/17  2:32 PM  Result Value Ref Range   Color, Urine YELLOW (A) YELLOW   APPearance HAZY (A) CLEAR   Specific Gravity, Urine 1.029 1.005 - 1.030   pH 5.0 5.0 - 8.0   Glucose, UA >=500 (A) NEGATIVE mg/dL   Hgb urine dipstick NEGATIVE NEGATIVE   Bilirubin Urine NEGATIVE NEGATIVE   Ketones, ur NEGATIVE NEGATIVE mg/dL   Protein, ur NEGATIVE NEGATIVE mg/dL   Nitrite NEGATIVE NEGATIVE   Leukocytes, UA TRACE (A) NEGATIVE   RBC / HPF 6-30 0 - 5 RBC/hpf   WBC, UA 6-30 0 - 5 WBC/hpf   Bacteria, UA NONE SEEN NONE SEEN   Squamous Epithelial / LPF 0-5 (A) NONE SEEN   Mucus PRESENT    Hyaline Casts, UA PRESENT   POCT Glucose (CBG)     Status: Abnormal   Collection Time: 07/17/17  1:33 PM  Result Value Ref Range   POC Glucose 500+ 70 - 99 mg/dl    Comment: to high to read  Glucose, capillary     Status: Abnormal   Collection Time: 07/17/17  2:24 PM  Result Value Ref Range   Glucose-Capillary >600 (HH) 65 - 99 mg/dL  Basic metabolic panel     Status: Abnormal   Collection Time: 07/17/17  2:47 PM  Result Value Ref Range   Sodium 128 (L) 135 - 145 mmol/L   Potassium 3.8 3.5 - 5.1 mmol/L   Chloride 93 (L) 101 - 111 mmol/L   CO2 20 (L) 22 - 32 mmol/L   Glucose, Bld 683 (HH) 65  - 99 mg/dL    Comment: CRITICAL RESULT CALLED TO, READ BACK BY AND VERIFIED WITH OLIVIA BROOMER 07/17/17 @ 1520 MLK    BUN 33 (H) 6 - 20 mg/dL   Creatinine, Ser 1.37 (H) 0.44 - 1.00 mg/dL   Calcium  9.2 8.9 - 10.3 mg/dL   GFR calc non Af Amer 43 (L) >60 mL/min   GFR calc Af Amer 50 (L) >60 mL/min    Comment: (NOTE) The eGFR has been calculated using the CKD EPI equation. This calculation has not been validated in all clinical situations. eGFR's persistently <60 mL/min signify possible Chronic Kidney Disease.    Anion gap 15 5 - 15  CBC     Status: None   Collection Time: 07/17/17  2:47 PM  Result Value Ref Range   WBC 8.4 3.6 - 11.0 K/uL   RBC 4.51 3.80 - 5.20 MIL/uL   Hemoglobin 13.0 12.0 - 16.0 g/dL   HCT 40.1 35.0 - 47.0 %   MCV 88.9 80.0 - 100.0 fL   MCH 28.8 26.0 - 34.0 pg   MCHC 32.4 32.0 - 36.0 g/dL   RDW 14.3 11.5 - 14.5 %   Platelets 231 150 - 440 K/uL  Urinalysis, Complete w Microscopic     Status: Abnormal   Collection Time: 07/17/17  2:47 PM  Result Value Ref Range   Color, Urine COLORLESS (A) YELLOW   APPearance CLEAR (A) CLEAR   Specific Gravity, Urine 1.026 1.005 - 1.030   pH 5.0 5.0 - 8.0   Glucose, UA >=500 (A) NEGATIVE mg/dL   Hgb urine dipstick NEGATIVE NEGATIVE   Bilirubin Urine NEGATIVE NEGATIVE   Ketones, ur 5 (A) NEGATIVE mg/dL   Protein, ur NEGATIVE NEGATIVE mg/dL   Nitrite NEGATIVE NEGATIVE   Leukocytes, UA NEGATIVE NEGATIVE   RBC / HPF NONE SEEN 0 - 5 RBC/hpf   WBC, UA 0-5 0 - 5 WBC/hpf   Bacteria, UA NONE SEEN NONE SEEN   Squamous Epithelial / LPF 0-5 (A) NONE SEEN  Glucose, capillary     Status: Abnormal   Collection Time: 07/17/17  4:15 PM  Result Value Ref Range   Glucose-Capillary 530 (HH) 65 - 99 mg/dL  Glucose, capillary     Status: Abnormal   Collection Time: 07/17/17  6:54 PM  Result Value Ref Range   Glucose-Capillary 284 (H) 65 - 99 mg/dL  POCT urinalysis dipstick     Status: Abnormal   Collection Time: 08/16/17  2:32 PM   Result Value Ref Range   Color, UA pale yellow    Clarity, UA clear    Glucose, UA 2+    Bilirubin, UA negative    Ketones, UA 5    Spec Grav, UA 1.010 1.010 - 1.025   Blood, UA hemolyzed trace    pH, UA 6.0 5.0 - 8.0   Protein, UA trace    Urobilinogen, UA 0.2 0.2 or 1.0 E.U./dL   Nitrite, UA negative    Leukocytes, UA Negative Negative   Appearance     Odor       Assessment & Plan  1. Acute recurrent frontal sinusitis - amoxicillin-clavulanate (AUGMENTIN) 875-125 MG tablet; Take 1 tablet by mouth 2 (two) times daily for 10 days.  Dispense: 20 tablet; Refill: 0 - Continue flonase daily. - Advised elevated heart rate may be due to current infection.  Advised that she present for emergency care if she feels short of breath, chest pain, or palpitations.  2. Burning with urination - POCT urinalysis dipstick - Urine Culture - WET PREP BY MOLECULAR PROBE  3. Vaginal irritation - Urine Culture - WET PREP BY MOLECULAR PROBE - Advised to stop yeast medications for the time being and await results.  4. Type 2 diabetes, uncontrolled, with neuropathy (  [place]) - POCT glucose (manual entry) - Poorly controlled, takes Jardiance; advised that she must take insulin as prescribed.  - Take insulin and Jardiance as prescribed, keep close follow up with endocrinology.  - Reviewed patient case with [name], PCP, who is in agreement with the plan of care.  -Red flags and when to present for emergency care or RTC including fever >101.41F, chest pain, shortness of breath, palpitations, new/worsening/un-resolving symptoms, reviewed with patient at time of visit. Follow up and care instructions discussed and provided in AVS. - Return if symptoms worsen or fail to improve, for 1-3 days.

## 2017-08-17 ENCOUNTER — Other Ambulatory Visit: Payer: Self-pay | Admitting: Family Medicine

## 2017-08-17 DIAGNOSIS — B373 Candidiasis of vulva and vagina: Secondary | ICD-10-CM

## 2017-08-17 DIAGNOSIS — B3731 Acute candidiasis of vulva and vagina: Secondary | ICD-10-CM

## 2017-08-17 LAB — WET PREP BY MOLECULAR PROBE
Candida species: DETECTED — AB
GARDNERELLA VAGINALIS: NOT DETECTED
MICRO NUMBER:: 81403507
SPECIMEN QUALITY: ADEQUATE
TRICHOMONAS VAG: NOT DETECTED

## 2017-08-17 LAB — GLUCOSE, POCT (MANUAL RESULT ENTRY): POC Glucose: 486 mg/dl — AB (ref 70–99)

## 2017-08-17 LAB — URINE CULTURE
MICRO NUMBER: 81404703
SPECIMEN QUALITY: ADEQUATE

## 2017-08-17 MED ORDER — TERCONAZOLE 0.4 % VA CREA: 1.0000 | TOPICAL_CREAM | Freq: Every day | VAGINAL | 0 refills | Status: DC

## 2017-08-22 ENCOUNTER — Encounter: Payer: Self-pay | Admitting: Family Medicine

## 2017-08-22 ENCOUNTER — Ambulatory Visit: Payer: Self-pay | Admitting: *Deleted

## 2017-08-22 ENCOUNTER — Ambulatory Visit (INDEPENDENT_AMBULATORY_CARE_PROVIDER_SITE_OTHER): Payer: Medicare HMO | Admitting: Family Medicine

## 2017-08-22 VITALS — BP 110/68 | HR 103 | Temp 98.8°F | Resp 16 | Wt 198.5 lb

## 2017-08-22 DIAGNOSIS — E1165 Type 2 diabetes mellitus with hyperglycemia: Secondary | ICD-10-CM | POA: Diagnosis not present

## 2017-08-22 DIAGNOSIS — R059 Cough, unspecified: Secondary | ICD-10-CM

## 2017-08-22 DIAGNOSIS — J449 Chronic obstructive pulmonary disease, unspecified: Secondary | ICD-10-CM | POA: Diagnosis not present

## 2017-08-22 DIAGNOSIS — J0141 Acute recurrent pansinusitis: Secondary | ICD-10-CM

## 2017-08-22 DIAGNOSIS — R05 Cough: Secondary | ICD-10-CM | POA: Diagnosis not present

## 2017-08-22 DIAGNOSIS — R5383 Other fatigue: Secondary | ICD-10-CM

## 2017-08-22 DIAGNOSIS — E1140 Type 2 diabetes mellitus with diabetic neuropathy, unspecified: Secondary | ICD-10-CM

## 2017-08-22 DIAGNOSIS — IMO0002 Reserved for concepts with insufficient information to code with codable children: Secondary | ICD-10-CM

## 2017-08-22 MED ORDER — AZELASTINE-FLUTICASONE 137-50 MCG/ACT NA SUSP: 2.0000 | Freq: Every day | NASAL | 0 refills | Status: DC

## 2017-08-22 MED ORDER — LEVOFLOXACIN 500 MG PO TABS: 500.0000 mg | ORAL_TABLET | Freq: Every day | ORAL | 0 refills | Status: AC

## 2017-08-22 MED ORDER — ALBUTEROL SULFATE HFA 108 (90 BASE) MCG/ACT IN AERS: 2.0000 | INHALATION_SPRAY | RESPIRATORY_TRACT | 1 refills | Status: DC | PRN

## 2017-08-22 NOTE — Progress Notes (Addendum)
Name: [name]   MRN: [id number]    DOB: 12-29-1965   Date:08/22/2017       Progress Note  Subjective  Chief Complaint  Chief Complaint  Patient presents with  . Cough    Pt is coughing to she feels like she has to vomitt  . Nasal Congestion    still having this issue; Still taking the antibiotics as instucted, she has 4 more days left of the antibiotics    HPI  Pt presents with concern for ongoing nasal congestion and sinus pain, burning in her lungs when she coughs, nausea/gagging with coughing, continue to have chills, ears popping, fatigue, body aches, some lightheadedness and headaches. Denies fevers chest pain (aside from burning described above), no rashes, dizziness.  Has seen Dr. Virgia Land at [facility] in the past for persistent pansinusitis.  Using flonase, symbicort daily without relief of symptoms. Albuterol inhaler - using PRN with only moderate relief.  Pt is uncontrolled diabetic - these have been remaining in the 300-400 level. Was treated for vaginal candidiasis after 08/16/2017 visit and she states this has completely resolved.  Advised she needs to call her endocrinologist today to advise on elevated BG's.  Patient Active Problem List   Diagnosis Date Noted  . Lobar pneumonia ([place]) 06/05/2017  . Multifocal pneumonia 05/27/2017  . Pleuritic chest pain 05/27/2017  . Hypertriglyceridemia 12/21/2016  . Pansinusitis 11/15/2016  . 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 Breast cancer screening 09/28/2016  . Urine frequency 09/28/2016  . Preventative health care 09/28/2016  . Fatigue 05/01/2016  . Urinary tract infection 03/14/2016  . Adenomatous colon polyp 10/15/2015  . Airway hyperreactivity 10/15/2015  . Bipolar affective disorder ([place]) 10/15/2015  . CN (constipation) 10/15/2015  . CAFL (chronic airflow limitation) ([place]) 10/15/2015  . Drug-induced hepatic toxicity 10/15/2015  . Blood in feces 10/15/2015  . HLD (hyperlipidemia)  10/15/2015  . Hypoxemia 10/15/2015  . NASH (nonalcoholic steatohepatitis) 10/15/2015  . Morbid obesity ([place]) 10/15/2015  . Restless leg syndrome 10/15/2015  . Type 2 diabetes, uncontrolled, with neuropathy ([place]) 10/15/2015  . Elevated alkaline phosphatase level 09/22/2015  . Urinary bladder incontinence 09/22/2015  . Low back pain with left-sided sciatica 08/18/2015  . Encounter for monitoring tricyclic antidepressant therapy 05/13/2015  . Bipolar disorder ([place]) 05/11/2015  . Liver disease 05/11/2015  . Apnea, sleep 05/05/2015  . Heart valve disease 05/05/2015  . Chronic kidney disease, stage III (moderate) (HCC) 03/15/2015  . Migraine 03/12/2015  . COPD (chronic obstructive pulmonary disease) ([place]) 01/20/2015  . 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 GERD (gastroesophageal reflux disease) 01/20/2015  . Hypothyroid   . Vitamin D deficiency   . Urinary incontinence   . Liver lesion   . Hypomagnesemia   . Obstructive sleep apnea 10/20/2013  . PERIPHERAL NEUROPATHY 03/04/2009  . HEMORRHOIDS, EXTERNAL 09/03/2008  . ALLERGIC RHINITIS 07/04/2007  . ASTHMA, PERSISTENT, MODERATE 07/04/2007  . INTERSTITIAL CYSTITIS 07/04/2007  . HYPERTENSION, BENIGN ESSENTIAL 06/19/2007    Social History   Tobacco Use  . Smoking status: Never Smoker  . Smokeless tobacco: Never Used  Substance Use Topics  . Alcohol use: No     Current Outpatient Medications:  .  ACCU-CHEK AVIVA PLUS test strip, Check FSBS three times a day; LON 99 months, DX E11.40, Disp: 100 each, Rfl: 3 .  ACCU-CHEK SOFTCLIX LANCETS lancets, Check FSBS three times a day; LON 99 months, DX E11.40, Disp: 100 each, Rfl: 3 .  albuterol (PROVENTIL HFA;VENTOLIN  HFA) 108 (90 Base) MCG/ACT inhaler, Inhale 2 puffs into the lungs every 4 (four) hours as needed for wheezing or shortness of breath., Disp: 1 Inhaler, Rfl: 1 .  alum & mag hydroxide-simeth (MAALOX REGULAR STRENGTH) 200-200-20 MG/5ML suspension, Take 15 mLs by mouth  every 6 (six) hours as needed for indigestion or heartburn., Disp: 150 mL, Rfl: 0 .  amLODipine (NORVASC) 2.5 MG tablet, Take 1 tablet (2.5 mg total) by mouth daily., Disp: 30 tablet, Rfl: 11 .  aspirin EC 81 MG tablet, Take 1 tablet (81 mg total) by mouth daily., Disp: , Rfl:  .  atorvastatin (LIPITOR) 40 MG tablet, Take 1 tablet (40 mg total) by mouth at bedtime., Disp: 90 tablet, Rfl: 1 .  budesonide-formoterol (SYMBICORT) 160-4.5 MCG/ACT inhaler, Inhale 2 puffs into the lungs 2 (two) times daily. Every day; rinse mouth out after each use, Disp: 1 Inhaler, Rfl: 11 .  clobetasol cream (TEMOVATE) 4.08 %, Apply 1 application topically 2 (two) times daily. If needed; too strong for face, underarms, groin, children, Disp: 30 g, Rfl: 0 .  DULoxetine (CYMBALTA) 60 MG capsule, Take 120 mg by mouth daily., Disp: , Rfl:  .  ezetimibe (ZETIA) 10 MG tablet, Take 10 mg by mouth daily. , Disp: , Rfl:  .  famotidine (PEPCID) 20 MG tablet, Take 1 tablet (20 mg total) by mouth 2 (two) times daily., Disp: 60 tablet, Rfl: 0 .  gabapentin (NEURONTIN) 300 MG capsule, Take 1 capsule (300 mg total) by mouth 2 (two) times daily., Disp: 60 capsule, Rfl: 5 .  gabapentin (NEURONTIN) 600 MG tablet, Take 1 tablet (600 mg total) by mouth 2 (two) times daily., Disp: 60 tablet, Rfl: 2 .  HUMULIN R U-500 KWIKPEN 500 UNIT/ML kwikpen, 60 Units 3 (three) times daily with meals. , Disp: , Rfl:  .  hydrOXYzine (VISTARIL) 50 MG capsule, Take 50 mg daily by mouth., Disp: , Rfl:  .  Icosapent Ethyl (VASCEPA) 1 g CAPS, Two pills by mouth twice a day for cholesterol (this replaces Lovaza), Disp: 120 capsule, Rfl: 11 .  insulin aspart (NOVOLOG) 100 UNIT/ML injection, Short-acting insulin TID ac as follows: 70-150=30units 151-200=35units 201-250=40units 251-300=45units 301-350=50units 351-400=55units 401-450=60units (Patient taking differently: Inject 30-60 Units into the skin 3 (three) times daily with meals. Short-acting insulin TID ac as  follows: 70-150=30units 151-200=35units 201-250=40units 251-300=45units 301-350=50units 351-400=55units 401-450=60units), Disp: 5 vial, Rfl: 1 .  Insulin Syringe-Needle U-100 (ULTICARE INSULIN SYRINGE) 31G X 1/4" 0.3 ML MISC, Inject 1 each into the skin 5 (five) times daily as needed., Disp: 200 each, Rfl: 0 .  JARDIANCE 25 MG TABS tablet, Take 25 mg by mouth daily., Disp: , Rfl:  .  lamoTRIgine (LAMICTAL) 150 MG tablet, Take 150 mg by mouth daily. , Disp: , Rfl:  .  lamoTRIgine (LAMICTAL) 200 MG tablet, Take 200 mg daily by mouth., Disp: , Rfl:  .  levothyroxine (SYNTHROID, LEVOTHROID) 75 MCG tablet, Take 1 tablet (75 mcg total) by mouth daily., Disp: 30 tablet, Rfl: 11 .  linaclotide (LINZESS) 72 MCG capsule, Take 1 capsule (72 mcg total) by mouth daily before breakfast., Disp: 90 capsule, Rfl: 3 .  magnesium oxide (MAG-OX) 400 MG tablet, Take 1 tablet (400 mg total) by mouth 3 (three) times daily., Disp: 90 tablet, Rfl: 2 .  metoprolol tartrate (LOPRESSOR) 25 MG tablet, Take 0.5 tablets (12.5 mg total) by mouth 2 (two) times daily., Disp: 30 tablet, Rfl: 11 .  Multiple Vitamin (MULTIVITAMIN) capsule, Take 1 capsule by mouth daily. ,  Disp: , Rfl:  .  ondansetron (ZOFRAN ODT) 4 MG disintegrating tablet, Take 1 tablet (4 mg total) by mouth every 8 (eight) hours as needed for nausea or vomiting., Disp: 20 tablet, Rfl: 0 .  pantoprazole (PROTONIX) 40 MG tablet, Take 1 tablet (40 mg total) by mouth daily., Disp: 90 tablet, Rfl: 3 .  permethrin (ELIMITE) 5 % cream, Apply to body, leave in place for 8 hours and then shower clean., Disp: 60 g, Rfl: 1 .  QUEtiapine (SEROQUEL) 25 MG tablet, Take 25 mg by mouth 2 (two) times daily. , Disp: , Rfl:  .  QUEtiapine (SEROQUEL) 400 MG tablet, Take 800 mg by mouth at bedtime. , Disp: , Rfl:  .  ranitidine (ZANTAC) 300 MG tablet, Take 1 tablet (300 mg total) by mouth at bedtime., Disp: 30 tablet, Rfl: 5 .  solifenacin (VESICARE) 10 MG tablet, Take 10 mg daily by  mouth., Disp: , Rfl:  .  terconazole (TERAZOL 7) 0.4 % vaginal cream, Place 1 applicator vaginally at bedtime., Disp: 45 g, Rfl: 0 .  zolpidem (AMBIEN) 10 MG tablet, Take 10 mg by mouth at bedtime. , Disp: , Rfl:  .  Azelastine-Fluticasone 137-50 MCG/ACT SUSP, Place 2 sprays into the nose daily., Disp: 23 g, Rfl: 0 .  levofloxacin (LEVAQUIN) 500 MG tablet, Take 1 tablet (500 mg total) by mouth daily for 10 days., Disp: 10 tablet, Rfl: 0  Current Facility-Administered Medications:  .  betamethasone acetate-betamethasone sodium phosphate (CELESTONE) injection 3 mg, 3 mg, Intramuscular, Once, Evans, Brent M, DPM .  betamethasone acetate-betamethasone sodium phosphate (CELESTONE) injection 3 mg, 3 mg, Intramuscular, Once, Edrick Kins, DPM  Allergies  Allergen Reactions  . Morphine And Related     anaphylaxis  . Codeine     REACTION: itch, rash  . Codeine     itching  . Hydrocodone-Acetaminophen     REACTION: rash  . Mirtazapine     REACTION: closes throat  . Morphine And Related   . Sulfamethoxazole-Trimethoprim     REACTION: [place] all over  . Vicodin [Hydrocodone-Acetaminophen]     itching    ROS  Ten systems reviewed and is negative except as mentioned in HPI  Objective  Vitals:   08/22/17 1033  BP: 110/68  Pulse: (!) 103  Resp: 16  Temp: 98.8 F (37.1 C)  TempSrc: Oral  SpO2: 98%  Weight: 198 lb 8 oz (90 kg)   Body mass index is 40.09 kg/m.  Nursing Note and Vital Signs reviewed.  Physical Exam  Constitutional: Patient appears well-developed and well-nourished. Obese. No distress.  HEENT: head atraumatic, normocephalic, pupils equal and reactive to light, EOM's intact, TM's without erythema or bulging, tenderness to bilateral frontal and maxillary sinuses, neck supple with mild submandibular lymphadenopathy, oropharynx pink and moist without exudate.  Turbinates are inflamed bilaterally. Cardiovascular: Normal rate, regular rhythm, S1/S2 present.  No murmur  or rub heard. No BLE edema. Pulmonary/Chest: Effort normal and breath sounds clear throughout - no wheezes, crackles, or rhonchi. No respiratory distress or retractions. Psychiatric: Patient has a normal mood and affect. behavior is normal. Judgment and thought content normal.  Recent Results (from the past 2160 hour(s))  Basic metabolic panel     Status: Abnormal   Collection Time: 05/27/17  9:11 AM  Result Value Ref Range   Sodium 136 135 - 145 mmol/L   Potassium 4.2 3.5 - 5.1 mmol/L   Chloride 100 (L) 101 - 111 mmol/L   CO2 23 22 -  32 mmol/L   Glucose, Bld 395 (H) 65 - 99 mg/dL   BUN 9 6 - 20 mg/dL   Creatinine, Ser 1.01 (H) 0.44 - 1.00 mg/dL   Calcium 8.9 8.9 - 10.3 mg/dL   GFR calc non Af Amer >60 >60 mL/min   GFR calc Af Amer >60 >60 mL/min    Comment: (NOTE) The eGFR has been calculated using the CKD EPI equation. This calculation has not been validated in all clinical situations. eGFR's persistently <60 mL/min signify possible Chronic Kidney Disease.    Anion gap 13 5 - 15  CBC     Status: None   Collection Time: 05/27/17  9:11 AM  Result Value Ref Range   WBC 7.1 3.6 - 11.0 K/uL   RBC 4.50 3.80 - 5.20 MIL/uL   Hemoglobin 13.2 12.0 - 16.0 g/dL   HCT 39.0 35.0 - 47.0 %   MCV 86.7 80.0 - 100.0 fL   MCH 29.4 26.0 - 34.0 pg   MCHC 33.9 32.0 - 36.0 g/dL   RDW 14.0 11.5 - 14.5 %   Platelets 179 150 - 440 K/uL  Troponin I     Status: None   Collection Time: 05/27/17  9:11 AM  Result Value Ref Range   Troponin I <0.03 <0.03 ng/mL  Troponin I     Status: None   Collection Time: 05/27/17 12:53 PM  Result Value Ref Range   Troponin I <0.03 <0.03 ng/mL  Culture, blood (routine x 2)     Status: None   Collection Time: 05/27/17  3:46 PM  Result Value Ref Range   Specimen Description BLOOD LEFT ANTECUBITAL    Special Requests      BOTTLES DRAWN AEROBIC AND ANAEROBIC Blood Culture results may not be optimal due to an excessive volume of blood received in culture bottles    Culture NO GROWTH 5 DAYS    Report Status 06/01/2017 FINAL   Culture, blood (routine x 2)     Status: None   Collection Time: 05/27/17  3:46 PM  Result Value Ref Range   Specimen Description BLOOD BLOOD RIGHT HAND    Special Requests      BOTTLES DRAWN AEROBIC AND ANAEROBIC Blood Culture adequate volume   Culture NO GROWTH 5 DAYS    Report Status 06/01/2017 FINAL   Glucose, capillary     Status: Abnormal   Collection Time: 05/27/17  5:36 PM  Result Value Ref Range   Glucose-Capillary 276 (H) 65 - 99 mg/dL  Glucose, capillary     Status: Abnormal   Collection Time: 05/27/17  8:57 PM  Result Value Ref Range   Glucose-Capillary 224 (H) 65 - 99 mg/dL  Comprehensive metabolic panel     Status: Abnormal   Collection Time: 05/28/17  3:10 AM  Result Value Ref Range   Sodium 137 135 - 145 mmol/L   Potassium 4.3 3.5 - 5.1 mmol/L   Chloride 99 (L) 101 - 111 mmol/L   CO2 27 22 - 32 mmol/L   Glucose, Bld 203 (H) 65 - 99 mg/dL   BUN 13 6 - 20 mg/dL   Creatinine, Ser 0.96 0.44 - 1.00 mg/dL   Calcium 9.0 8.9 - 10.3 mg/dL   Total Protein 6.5 6.5 - 8.1 g/dL   Albumin 3.7 3.5 - 5.0 g/dL   AST 30 15 - 41 U/L   ALT 31 14 - 54 U/L   Alkaline Phosphatase 82 38 - 126 U/L   Total Bilirubin 0.8 0.3 -  1.2 mg/dL   GFR calc non Af Amer >60 >60 mL/min   GFR calc Af Amer >60 >60 mL/min    Comment: (NOTE) The eGFR has been calculated using the CKD EPI equation. This calculation has not been validated in all clinical situations. eGFR's persistently <60 mL/min signify possible Chronic Kidney Disease.    Anion gap 11 5 - 15  CBC     Status: None   Collection Time: 05/28/17  3:10 AM  Result Value Ref Range   WBC 6.3 3.6 - 11.0 K/uL   RBC 4.53 3.80 - 5.20 MIL/uL   Hemoglobin 13.3 12.0 - 16.0 g/dL   HCT 39.2 35.0 - 47.0 %   MCV 86.4 80.0 - 100.0 fL   MCH 29.3 26.0 - 34.0 pg   MCHC 33.9 32.0 - 36.0 g/dL   RDW 14.3 11.5 - 14.5 %   Platelets 182 150 - 440 K/uL  HIV antibody     Status: None    Collection Time: 05/28/17  3:10 AM  Result Value Ref Range   HIV Screen 4th Generation wRfx Non Reactive Non Reactive    Comment: (NOTE) Performed At: [facility] [address] [place], [place] [id number] Lindon Romp MD [id number]   Glucose, capillary     Status: Abnormal   Collection Time: 05/28/17  7:45 AM  Result Value Ref Range   Glucose-Capillary 349 (H) 65 - 99 mg/dL  Glucose, capillary     Status: Abnormal   Collection Time: 05/28/17 11:54 AM  Result Value Ref Range   Glucose-Capillary 544 (HH) 65 - 99 mg/dL  Glucose, capillary     Status: Abnormal   Collection Time: 05/28/17  1:34 PM  Result Value Ref Range   Glucose-Capillary 599 (HH) 65 - 99 mg/dL   Comment 1 Notify RN   Glucose, capillary     Status: Abnormal   Collection Time: 05/28/17  4:22 PM  Result Value Ref Range   Glucose-Capillary 461 (H) 65 - 99 mg/dL  Glucose, capillary     Status: Abnormal   Collection Time: 05/28/17  9:10 PM  Result Value Ref Range   Glucose-Capillary 303 (H) 65 - 99 mg/dL  Glucose, capillary     Status: Abnormal   Collection Time: 05/29/17  8:06 AM  Result Value Ref Range   Glucose-Capillary 241 (H) 65 - 99 mg/dL  Glucose, capillary     Status: Abnormal   Collection Time: 05/29/17 11:52 AM  Result Value Ref Range   Glucose-Capillary 175 (H) 65 - 99 mg/dL  Basic metabolic panel     Status: Abnormal   Collection Time: 06/06/17  3:53 PM  Result Value Ref Range   Sodium 132 (L) 135 - 145 mmol/L   Potassium 3.8 3.5 - 5.1 mmol/L   Chloride 97 (L) 101 - 111 mmol/L   CO2 25 22 - 32 mmol/L   Glucose, Bld 525 (HH) 65 - 99 mg/dL    Comment: CRITICAL RESULT CALLED TO, READ BACK BY AND VERIFIED WITH KENDALL MOFFITT 06/06/17 1644 KLW    BUN 20 6 - 20 mg/dL   Creatinine, Ser 1.17 (H) 0.44 - 1.00 mg/dL   Calcium 9.2 8.9 - 10.3 mg/dL   GFR calc non Af Amer 52 (L) >60 mL/min   GFR calc Af Amer >60 >60 mL/min    Comment: (NOTE) The eGFR has been calculated using the CKD EPI  equation. This calculation has not been validated in all clinical situations. eGFR's persistently <60 mL/min signify possible  Chronic Kidney Disease.    Anion gap 10 5 - 15  CBC     Status: Abnormal   Collection Time: 06/06/17  3:53 PM  Result Value Ref Range   WBC 11.3 (H) 3.6 - 11.0 K/uL   RBC 4.37 3.80 - 5.20 MIL/uL   Hemoglobin 12.6 12.0 - 16.0 g/dL   HCT 38.6 35.0 - 47.0 %   MCV 88.3 80.0 - 100.0 fL   MCH 28.8 26.0 - 34.0 pg   MCHC 32.6 32.0 - 36.0 g/dL   RDW 14.4 11.5 - 14.5 %   Platelets 279 150 - 440 K/uL  Urinalysis, Complete w Microscopic     Status: Abnormal   Collection Time: 06/06/17  3:53 PM  Result Value Ref Range   Color, Urine COLORLESS (A) YELLOW   APPearance CLEAR (A) CLEAR   Specific Gravity, Urine 1.024 1.005 - 1.030   pH 5.0 5.0 - 8.0   Glucose, UA >=500 (A) NEGATIVE mg/dL   Hgb urine dipstick NEGATIVE NEGATIVE   Bilirubin Urine NEGATIVE NEGATIVE   Ketones, ur NEGATIVE NEGATIVE mg/dL   Protein, ur NEGATIVE NEGATIVE mg/dL   Nitrite NEGATIVE NEGATIVE   Leukocytes, UA NEGATIVE NEGATIVE   RBC / HPF 0-5 0 - 5 RBC/hpf   WBC, UA 0-5 0 - 5 WBC/hpf   Bacteria, UA NONE SEEN NONE SEEN   Squamous Epithelial / LPF 0-5 (A) NONE SEEN  Hepatic function panel     Status: Abnormal   Collection Time: 06/06/17  3:53 PM  Result Value Ref Range   Total Protein 6.5 6.5 - 8.1 g/dL   Albumin 3.8 3.5 - 5.0 g/dL   AST 22 15 - 41 U/L   ALT 20 14 - 54 U/L   Alkaline Phosphatase 72 38 - 126 U/L   Total Bilirubin 0.7 0.3 - 1.2 mg/dL   Bilirubin, Direct <0.1 (L) 0.1 - 0.5 mg/dL   Indirect Bilirubin NOT CALCULATED 0.3 - 0.9 mg/dL  Lipase, blood     Status: None   Collection Time: 06/06/17  3:53 PM  Result Value Ref Range   Lipase 30 11 - 51 U/L  Pregnancy, urine     Status: None   Collection Time: 06/06/17  3:53 PM  Result Value Ref Range   Preg Test, Ur NEGATIVE NEGATIVE  Glucose, capillary     Status: Abnormal   Collection Time: 06/06/17  3:59 PM  Result Value Ref  Range   Glucose-Capillary 523 (HH) 65 - 99 mg/dL  Blood gas, venous     Status: Abnormal   Collection Time: 06/06/17  4:25 PM  Result Value Ref Range   pH, Ven 7.43 7.250 - 7.430   pCO2, Ven 43 (L) 44.0 - 60.0 mmHg   pO2, Ven 56.0 (H) 32.0 - 45.0 mmHg   Bicarbonate 28.5 (H) 20.0 - 28.0 mmol/L   Acid-Base Excess 3.7 (H) 0.0 - 2.0 mmol/L   O2 Saturation 89.6 %   Patient temperature 37.0    Collection site VEIN    Sample type VEIN   Glucose, capillary     Status: None   Collection Time: 06/06/17  8:29 PM  Result Value Ref Range   Glucose-Capillary 92 65 - 99 mg/dL  Basic metabolic panel     Status: Abnormal   Collection Time: 06/26/17 12:42 PM  Result Value Ref Range   Sodium 136 135 - 145 mmol/L   Potassium 4.4 3.5 - 5.1 mmol/L   Chloride 97 (L) 101 - 111 mmol/L  CO2 24 22 - 32 mmol/L   Glucose, Bld 364 (H) 65 - 99 mg/dL   BUN 19 6 - 20 mg/dL   Creatinine, Ser 1.05 (H) 0.44 - 1.00 mg/dL   Calcium 9.7 8.9 - 10.3 mg/dL   GFR calc non Af Amer 60 (L) >60 mL/min   GFR calc Af Amer >60 >60 mL/min    Comment: (NOTE) The eGFR has been calculated using the CKD EPI equation. This calculation has not been validated in all clinical situations. eGFR's persistently <60 mL/min signify possible Chronic Kidney Disease.    Anion gap 15 5 - 15  CBC     Status: Abnormal   Collection Time: 06/26/17 12:42 PM  Result Value Ref Range   WBC 11.5 (H) 3.6 - 11.0 K/uL   RBC 4.96 3.80 - 5.20 MIL/uL   Hemoglobin 14.3 12.0 - 16.0 g/dL   HCT 44.2 35.0 - 47.0 %   MCV 89.1 80.0 - 100.0 fL   MCH 28.7 26.0 - 34.0 pg   MCHC 32.2 32.0 - 36.0 g/dL   RDW 14.5 11.5 - 14.5 %   Platelets 238 150 - 440 K/uL  Troponin I     Status: None   Collection Time: 06/26/17 12:42 PM  Result Value Ref Range   Troponin I <0.03 <0.03 ng/mL  Fibrin derivatives D-Dimer ([facility] only)     Status: None   Collection Time: 06/26/17  2:49 PM  Result Value Ref Range   Fibrin derivatives D-dimer ([facility]) 255.73 0.00 - 499.00 ng/mL  (FEU)    Comment: (NOTE) <> Exclusion of Venous Thromboembolism (VTE) - OUTPATIENT ONLY   (Emergency Department or [place])   0-499 ng/ml (FEU): With a low to intermediate pretest probability                      for VTE this test result excludes the diagnosis                      of VTE.   >499 ng/ml (FEU) : VTE not excluded; additional work up for VTE is                      required. <> Testing on Inpatients and Evaluation of Disseminated Intravascular   Coagulation ([facility]) Reference Range:   0-499 ng/ml (FEU)   Troponin I     Status: None   Collection Time: 06/26/17  2:49 PM  Result Value Ref Range   Troponin I <0.03 <0.03 ng/mL  Lipase, blood     Status: None   Collection Time: 07/14/17  2:32 PM  Result Value Ref Range   Lipase 21 11 - 51 U/L  Comprehensive metabolic panel     Status: Abnormal   Collection Time: 07/14/17  2:32 PM  Result Value Ref Range   Sodium 137 135 - 145 mmol/L   Potassium 3.7 3.5 - 5.1 mmol/L   Chloride 102 101 - 111 mmol/L   CO2 24 22 - 32 mmol/L   Glucose, Bld 254 (H) 65 - 99 mg/dL   BUN 12 6 - 20 mg/dL   Creatinine, Ser 1.01 (H) 0.44 - 1.00 mg/dL   Calcium 9.4 8.9 - 10.3 mg/dL   Total Protein 6.9 6.5 - 8.1 g/dL   Albumin 3.9 3.5 - 5.0 g/dL   AST 31 15 - 41 U/L   ALT 25 14 - 54 U/L   Alkaline Phosphatase 89 38 - 126 U/L  Total Bilirubin 0.6 0.3 - 1.2 mg/dL   GFR calc non Af Amer >60 >60 mL/min   GFR calc Af Amer >60 >60 mL/min    Comment: (NOTE) The eGFR has been calculated using the CKD EPI equation. This calculation has not been validated in all clinical situations. eGFR's persistently <60 mL/min signify possible Chronic Kidney Disease.    Anion gap 11 5 - 15  CBC     Status: None   Collection Time: 07/14/17  2:32 PM  Result Value Ref Range   WBC 7.8 3.6 - 11.0 K/uL   RBC 4.73 3.80 - 5.20 MIL/uL   Hemoglobin 13.7 12.0 - 16.0 g/dL   HCT 41.2 35.0 - 47.0 %   MCV 87.0 80.0 - 100.0 fL   MCH 28.9 26.0 - 34.0 pg   MCHC 33.2 32.0 - 36.0  g/dL   RDW 14.2 11.5 - 14.5 %   Platelets 264 150 - 440 K/uL  Urinalysis, Complete w Microscopic     Status: Abnormal   Collection Time: 07/14/17  2:32 PM  Result Value Ref Range   Color, Urine YELLOW (A) YELLOW   APPearance HAZY (A) CLEAR   Specific Gravity, Urine 1.029 1.005 - 1.030   pH 5.0 5.0 - 8.0   Glucose, UA >=500 (A) NEGATIVE mg/dL   Hgb urine dipstick NEGATIVE NEGATIVE   Bilirubin Urine NEGATIVE NEGATIVE   Ketones, ur NEGATIVE NEGATIVE mg/dL   Protein, ur NEGATIVE NEGATIVE mg/dL   Nitrite NEGATIVE NEGATIVE   Leukocytes, UA TRACE (A) NEGATIVE   RBC / HPF 6-30 0 - 5 RBC/hpf   WBC, UA 6-30 0 - 5 WBC/hpf   Bacteria, UA NONE SEEN NONE SEEN   Squamous Epithelial / LPF 0-5 (A) NONE SEEN   Mucus PRESENT    Hyaline Casts, UA PRESENT   POCT Glucose (CBG)     Status: Abnormal   Collection Time: 07/17/17  1:33 PM  Result Value Ref Range   POC Glucose 500+ 70 - 99 mg/dl    Comment: to high to read  Glucose, capillary     Status: Abnormal   Collection Time: 07/17/17  2:24 PM  Result Value Ref Range   Glucose-Capillary >600 (HH) 65 - 99 mg/dL  Basic metabolic panel     Status: Abnormal   Collection Time: 07/17/17  2:47 PM  Result Value Ref Range   Sodium 128 (L) 135 - 145 mmol/L   Potassium 3.8 3.5 - 5.1 mmol/L   Chloride 93 (L) 101 - 111 mmol/L   CO2 20 (L) 22 - 32 mmol/L   Glucose, Bld 683 (HH) 65 - 99 mg/dL    Comment: CRITICAL RESULT CALLED TO, READ BACK BY AND VERIFIED WITH OLIVIA BROOMER 07/17/17 @ 1520 MLK    BUN 33 (H) 6 - 20 mg/dL   Creatinine, Ser 1.37 (H) 0.44 - 1.00 mg/dL   Calcium 9.2 8.9 - 10.3 mg/dL   GFR calc non Af Amer 43 (L) >60 mL/min   GFR calc Af Amer 50 (L) >60 mL/min    Comment: (NOTE) The eGFR has been calculated using the CKD EPI equation. This calculation has not been validated in all clinical situations. eGFR's persistently <60 mL/min signify possible Chronic Kidney Disease.    Anion gap 15 5 - 15  CBC     Status: None   Collection Time:  07/17/17  2:47 PM  Result Value Ref Range   WBC 8.4 3.6 - 11.0 K/uL   RBC 4.51 3.80 - 5.20  MIL/uL   Hemoglobin 13.0 12.0 - 16.0 g/dL   HCT 40.1 35.0 - 47.0 %   MCV 88.9 80.0 - 100.0 fL   MCH 28.8 26.0 - 34.0 pg   MCHC 32.4 32.0 - 36.0 g/dL   RDW 14.3 11.5 - 14.5 %   Platelets 231 150 - 440 K/uL  Urinalysis, Complete w Microscopic     Status: Abnormal   Collection Time: 07/17/17  2:47 PM  Result Value Ref Range   Color, Urine COLORLESS (A) YELLOW   APPearance CLEAR (A) CLEAR   Specific Gravity, Urine 1.026 1.005 - 1.030   pH 5.0 5.0 - 8.0   Glucose, UA >=500 (A) NEGATIVE mg/dL   Hgb urine dipstick NEGATIVE NEGATIVE   Bilirubin Urine NEGATIVE NEGATIVE   Ketones, ur 5 (A) NEGATIVE mg/dL   Protein, ur NEGATIVE NEGATIVE mg/dL   Nitrite NEGATIVE NEGATIVE   Leukocytes, UA NEGATIVE NEGATIVE   RBC / HPF NONE SEEN 0 - 5 RBC/hpf   WBC, UA 0-5 0 - 5 WBC/hpf   Bacteria, UA NONE SEEN NONE SEEN   Squamous Epithelial / LPF 0-5 (A) NONE SEEN  Glucose, capillary     Status: Abnormal   Collection Time: 07/17/17  4:15 PM  Result Value Ref Range   Glucose-Capillary 530 (HH) 65 - 99 mg/dL  Glucose, capillary     Status: Abnormal   Collection Time: 07/17/17  6:54 PM  Result Value Ref Range   Glucose-Capillary 284 (H) 65 - 99 mg/dL  POCT urinalysis dipstick     Status: Abnormal   Collection Time: 08/16/17  2:32 PM  Result Value Ref Range   Color, UA pale yellow    Clarity, UA clear    Glucose, UA 2+    Bilirubin, UA negative    Ketones, UA 5    Spec Grav, UA 1.010 1.010 - 1.025   Blood, UA hemolyzed trace    pH, UA 6.0 5.0 - 8.0   Protein, UA trace    Urobilinogen, UA 0.2 0.2 or 1.0 E.U./dL   Nitrite, UA negative    Leukocytes, UA Negative Negative   Appearance     Odor    Urine Culture     Status: None   Collection Time: 08/16/17  3:26 PM  Result Value Ref Range   MICRO NUMBER: [id number]    SPECIMEN QUALITY: ADEQUATE    Sample Source URINE    STATUS: FINAL    Result:       Multiple organisms present, each less than 10,000 CFU/mL. These organisms, commonly found on external and internal genitalia, are considered to be colonizers. No further testing performed.  WET PREP BY MOLECULAR PROBE     Status: Abnormal   Collection Time: 08/16/17  3:28 PM  Result Value Ref Range   MICRO NUMBER: [id number]    SPECIMEN QUALITY: ADEQUATE    SOURCE:     STATUS: FINAL    Trichomonas vaginosis Not Detected    Gardnerella vaginalis Not Detected    Candida species Detected (A)   POCT glucose (manual entry)     Status: Abnormal   Collection Time: 08/17/17  8:16 AM  Result Value Ref Range   POC Glucose 486 (A) 70 - 99 mg/dl     Assessment & Plan  1. Acute recurrent pansinusitis - Azelastine-Fluticasone 137-50 MCG/ACT SUSP; Place 2 sprays into the nose daily.  Dispense: 23 g; Refill: 0 - levofloxacin (LEVAQUIN) 500 MG tablet; Take 1 tablet (500 mg total) by mouth daily for  10 days.  Dispense: 10 tablet; Refill: 0 - STOP Augmentin and Flonase. - Discussed risk of QT-prolongation while taking Levaquin and Seroquel. Pt has taken together in the past and done well.  She also notes that she sees her psychiatrist tomorrow and will discuss with her.  Pt verbalizes understanding of risk and red flags. Recent ECG is reviewed and no underlying QT-prolongation is noted.  2. Cough - albuterol (PROVENTIL HFA;VENTOLIN HFA) 108 (90 Base) MCG/ACT inhaler; Inhale 2 puffs into the lungs every 4 (four) hours as needed for wheezing or shortness of breath.  Dispense: 1 Inhaler; Refill: 1  3. Chronic obstructive pulmonary disease, unspecified COPD type (HCC) - albuterol (PROVENTIL HFA;VENTOLIN HFA) 108 (90 Base) MCG/ACT inhaler; Inhale 2 puffs into the lungs every 4 (four) hours as needed for wheezing or shortness of breath.  Dispense: 1 Inhaler; Refill: 1  - Discussed option to perform Chest Xray per pt request, advised this will not change the plan of care if pneumonia is present as Levaquin is  already prescribed.  Pt comfortable with holding on CXR for the time being. We will hold on oral corticosteroid treatment for cough and possible COPD exacerbation due to patient's uncontrolled DM.  4. Type 2 diabetes, uncontrolled, with neuropathy (HCC) - Discussed the effect of uncontrolled BG's on her immune system, decreased ability to fight infection, increased risk of complicated infections, etc. - pt verbalizes understanding. - Advised pt to call Dr. Barkley Boards office to notify that she is taking Levaquin and that she has had uncontrolled BG's for several days.  She does have follow up in January already scheduled.   - Return if symptoms worsen or fail to improve, for 2-5 days. -Red flags and when to present for emergency care or RTC including fever >101.85F, chest pain, shortness of breath unrelieved with albuterol inhaler, palpitations, new/worsening/un-resolving symptoms, reviewed with patient at time of visit. Follow up and care instructions discussed and provided in AVS.

## 2017-08-22 NOTE — Telephone Encounter (Addendum)
Pt seen in office 08/16/17 and given antibiotic for sinus infection; Pt says it  Is not helping and urts when she breaths, and she is full of chest congestion; pt says her flonase is not working;  Pt offered and accepted appointment for 08/22/17 at 1020 with [facility]; pt verifies understanding; will route to [facility] pool for notification of this upcoming appointment; spoke with Cassandra at [place] regarding this appointment..  Reason for Disposition . [1] Taking antibiotic > 72 hours (3 days) AND [2] sinus pain not improved  Answer Assessment - Initial Assessment Questions 1. ANTIBIOTIC: "What antibiotic are you receiving?" "How many times per day?"     Augmentin 2. ONSET: "When was the antibiotic started?"     08/16/17 3. PAIN: "How bad is the sinus pain?"   (Scale 1-10; mild, moderate or severe)   - MILD (1-3): doesn't interfere with normal activities    - MODERATE (4-7): interferes with normal activities (e.g., work or school) or awakens from sleep   - SEVERE (8-10): excruciating pain and patient unable to do any normal activities        No pain 4. FEVER: "Do you have a fever?" If so, ask: "What is it, how was it measured, and when did it start?"      no 5. SYMPTOMS: "Are there any other symptoms you're concerned about?" If so, ask: "When did it start?"     Chills, drainage in throat that causes her gag when she coughs; coughing makes her short of breath  6. PREGNANCY: "Is there any chance you are pregnant?" "When was your last menstrual period?"     No  hysterectomy  Protocols used: SINUS INFECTION ON ANTIBIOTIC FOLLOW-UP CALL-A-AH

## 2017-08-22 NOTE — Patient Instructions (Addendum)
Please call Dr. Barkley Boards office and advise that your blood sugar has been running in the 300-400 range for several days and that you are taking antibiotics.  STOP Augmentin (Amoxicillin-clavulanate) and START Levaquin antibiotic.  Use cool mist humidifier at home.

## 2017-09-01 ENCOUNTER — Emergency Department
Admission: EM | Admit: 2017-09-01 | Discharge: 2017-09-01 | Disposition: A | Discharge status: Home / Self Care | Payer: Medicare HMO | Attending: Emergency Medicine | Admitting: Emergency Medicine

## 2017-09-01 ENCOUNTER — Encounter: Payer: Self-pay | Admitting: *Deleted

## 2017-09-01 ENCOUNTER — Other Ambulatory Visit: Payer: Self-pay

## 2017-09-01 DIAGNOSIS — J45909 Unspecified asthma, uncomplicated: Secondary | ICD-10-CM | POA: Diagnosis not present

## 2017-09-01 DIAGNOSIS — E860 Dehydration: Secondary | ICD-10-CM

## 2017-09-01 DIAGNOSIS — E039 Hypothyroidism, unspecified: Secondary | ICD-10-CM | POA: Insufficient documentation

## 2017-09-01 DIAGNOSIS — E1165 Type 2 diabetes mellitus with hyperglycemia: Secondary | ICD-10-CM | POA: Insufficient documentation

## 2017-09-01 DIAGNOSIS — I110 Hypertensive heart disease with heart failure: Secondary | ICD-10-CM | POA: Diagnosis not present

## 2017-09-01 DIAGNOSIS — E1140 Type 2 diabetes mellitus with diabetic neuropathy, unspecified: Secondary | ICD-10-CM | POA: Diagnosis not present

## 2017-09-01 DIAGNOSIS — J449 Chronic obstructive pulmonary disease, unspecified: Secondary | ICD-10-CM | POA: Insufficient documentation

## 2017-09-01 DIAGNOSIS — Z85828 Personal history of other malignant neoplasm of skin: Secondary | ICD-10-CM | POA: Diagnosis not present

## 2017-09-01 DIAGNOSIS — R739 Hyperglycemia, unspecified: Secondary | ICD-10-CM

## 2017-09-01 DIAGNOSIS — I509 Heart failure, unspecified: Secondary | ICD-10-CM | POA: Insufficient documentation

## 2017-09-01 DIAGNOSIS — Z7982 Long term (current) use of aspirin: Secondary | ICD-10-CM | POA: Insufficient documentation

## 2017-09-01 DIAGNOSIS — Z79899 Other long term (current) drug therapy: Secondary | ICD-10-CM | POA: Insufficient documentation

## 2017-09-01 DIAGNOSIS — Z794 Long term (current) use of insulin: Secondary | ICD-10-CM | POA: Diagnosis not present

## 2017-09-01 LAB — CBC
HEMATOCRIT: 44.2 % (ref 35.0–47.0)
HEMOGLOBIN: 14.6 g/dL (ref 12.0–16.0)
MCH: 28.7 pg (ref 26.0–34.0)
MCHC: 33 g/dL (ref 32.0–36.0)
MCV: 87.2 fL (ref 80.0–100.0)
Platelets: 279 10*3/uL (ref 150–440)
RBC: 5.07 MIL/uL (ref 3.80–5.20)
RDW: 13.2 % (ref 11.5–14.5)
WBC: 11.7 10*3/uL — AB (ref 3.6–11.0)

## 2017-09-01 LAB — URINALYSIS, COMPLETE (UACMP) WITH MICROSCOPIC
BACTERIA UA: NONE SEEN
BILIRUBIN URINE: NEGATIVE
Glucose, UA: >=500 mg/dL — AB
Hgb urine dipstick: NEGATIVE
KETONES UR: NEGATIVE mg/dL
LEUKOCYTES UA: NEGATIVE
NITRITE: NEGATIVE
PH: 5 (ref 5.0–8.0)
Protein, ur: NEGATIVE mg/dL
Specific Gravity, Urine: 1.031 — ABNORMAL HIGH (ref 1.005–1.030)

## 2017-09-01 LAB — BASIC METABOLIC PANEL
Anion gap: 13 (ref 5–15)
BUN: 20 mg/dL (ref 6–20)
CO2: 24 mmol/L (ref 22–32)
Calcium: 9.7 mg/dL (ref 8.9–10.3)
Chloride: 93 mmol/L — ABNORMAL LOW (ref 101–111)
Creatinine, Ser: 1.15 mg/dL — ABNORMAL HIGH (ref 0.44–1.00)
GFR calc Af Amer: >60 mL/min (ref >60)
GFR calc non Af Amer: 53 mL/min — ABNORMAL LOW (ref >60)
Glucose, Bld: 364 mg/dL — ABNORMAL HIGH (ref 65–99)
Potassium: 3.6 mmol/L (ref 3.5–5.1)
Sodium: 130 mmol/L — ABNORMAL LOW (ref 135–145)

## 2017-09-01 LAB — GLUCOSE, CAPILLARY
GLUCOSE-CAPILLARY: 416 mg/dL — AB (ref 65–99)
GLUCOSE-CAPILLARY: 78 mg/dL (ref 65–99)
Glucose-Capillary: 85 mg/dL (ref 65–99)

## 2017-09-01 MED ORDER — SODIUM CHLORIDE 0.9 % IV BOLUS (SEPSIS)
1000.0000 mL | Freq: Once | INTRAVENOUS | Status: AC
  Administered 2017-09-01: 1000 mL via INTRAVENOUS

## 2017-09-01 NOTE — ED Notes (Signed)
Task nurse unable to establish iv after multiple tries.

## 2017-09-01 NOTE — ED Provider Notes (Signed)
-----------------------------------------   10:46 PM on 09/01/2017 -----------------------------------------   Blood pressure 116/74, pulse 100, temperature 98.5 F (36.9 C), temperature source Oral, resp. rate (!) 23, height 4' 11"  (1.499 m), weight 81.6 kg (180 lb), SpO2 99 %.  Assuming care from Dr. Burlene Arnt of [facility] is a 51 y.o. female with a chief complaint of Hyperglycemia .    Please refer to H&P by previous MD for further details.  The current plan of care is to f/u repeat CBG after IVF and reassess.  CBG 85 after 1L bolus. Patient feels markedly improved and is requesting discharge. Will dc home on supportive care. Discussed return precautions with patient and close f/u with PCP        Rudene Re, MD 09/01/17 2248

## 2017-09-01 NOTE — ED Provider Notes (Signed)
[facility] Emergency Department Provider Note  ____________________________________________   I have reviewed the triage vital signs and the nursing notes. Where available I have reviewed prior notes and, if possible and indicated, outside hospital notes.    HISTORY  Chief Complaint Hyperglycemia    HPI [name] is a 51 y.o. female today complaining of feeling dehydrated.  Patient has a history of asthma bipolar CHF chronic headaches COPD diabetic neuropathy reflux disease poorly controlled glucose, among other things.  She has been here multiple times in the past for elevated blood sugar and dehydration.  She woke up this morning feeling like not eating and then did not eat.  Then she got dehydrated and now she feels dehydrated.  Her sugars were elevated at home today after not eating she took extra insulin.  Patient is taking Levaquin for a "sinus infection" which is completely without symptoms at this time, she has no headache she has no runny nose or cough, she states that the antibiotics are nearly gone.  She has had no fevers no chills no skin rash no diarrhea no other complaints, she feels "fine" except for she feels "dehydrated".    Past Medical History:  Diagnosis Date  . Asthma   . Bipolar depression ([place])   . CHF (congestive heart failure) ([place])   . Chronic headaches   . COPD (chronic obstructive pulmonary disease) ([place])   . Diabetic neuropathy ([place])   . GERD (gastroesophageal reflux disease)   . Headache   . Heart attack ([place])   . Hyperlipidemia   . Hypertension   . Hypomagnesemia   . Hypothyroid   . Irregular heart rhythm   . Liver lesion    Favored to be benign on CT; MRI of abd w/contract in Aug 2016. Liver Biopsy-Negative, March 2016  . Morbid obesity ([place])   . OSA (obstructive sleep apnea)   . Pinched nerve    Back  . Pneumonia   . RLS (restless legs syndrome)   . Seasonal allergies   . Sleep apnea   . Type 2 diabetes,  uncontrolled, with neuropathy ([place]) 10/15/2015   Overview:  Microalbumin 7.7 12/2010: Hgb A1c 10.2% 12/2010 and 9.7% 03/2011, eye exam 10/2009 [facility] DM clinic   . Urinary incontinence   . Vitamin D deficiency     Patient Active Problem List   Diagnosis Date Noted  . Lobar pneumonia ([place]) 06/05/2017  . Multifocal pneumonia 05/27/2017  . Pleuritic chest pain 05/27/2017  . Hypertriglyceridemia 12/21/2016  . Pansinusitis 11/15/2016  . 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 Breast cancer screening 09/28/2016  . Urine frequency 09/28/2016  . Preventative health care 09/28/2016  . Fatigue 05/01/2016  . Urinary tract infection 03/14/2016  . Adenomatous colon polyp 10/15/2015  . Airway hyperreactivity 10/15/2015  . Bipolar affective disorder ([place]) 10/15/2015  . CN (constipation) 10/15/2015  . CAFL (chronic airflow limitation) ([place]) 10/15/2015  . Drug-induced hepatic toxicity 10/15/2015  . Blood in feces 10/15/2015  . HLD (hyperlipidemia) 10/15/2015  . Hypoxemia 10/15/2015  . NASH (nonalcoholic steatohepatitis) 10/15/2015  . Morbid obesity ([place]) 10/15/2015  . Restless leg syndrome 10/15/2015  . Type 2 diabetes, uncontrolled, with neuropathy ([place]) 10/15/2015  . Elevated alkaline phosphatase level 09/22/2015  . Urinary bladder incontinence 09/22/2015  . Low back pain with left-sided sciatica 08/18/2015  . Encounter for monitoring tricyclic antidepressant therapy 05/13/2015  . Bipolar disorder ([place]) 05/11/2015  . Liver disease 05/11/2015  . Apnea, sleep 05/05/2015  .  Heart valve disease 05/05/2015  . Chronic kidney disease, stage III (moderate) (HCC) 03/15/2015  . Migraine 03/12/2015  . COPD (chronic obstructive pulmonary disease) ([place]) 01/20/2015  . 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 GERD (gastroesophageal reflux disease) 01/20/2015  . Hypothyroid   . Vitamin D deficiency   . Urinary incontinence   . Liver lesion   . Hypomagnesemia   . Obstructive  sleep apnea 10/20/2013  . PERIPHERAL NEUROPATHY 03/04/2009  . HEMORRHOIDS, EXTERNAL 09/03/2008  . ALLERGIC RHINITIS 07/04/2007  . ASTHMA, PERSISTENT, MODERATE 07/04/2007  . INTERSTITIAL CYSTITIS 07/04/2007  . HYPERTENSION, BENIGN ESSENTIAL 06/19/2007    Past Surgical History:  Procedure Laterality Date  . ABDOMINAL HYSTERECTOMY    . ABDOMINAL HYSTERECTOMY     Partial  . BLADDER SURGERY     x 2  . CHOLECYSTECTOMY    . LIVER BIOPSY  March 2016   Negative  . LUNG BIOPSY    . TUBAL LIGATION      Prior to Admission medications   Medication Sig Start Date End Date Taking? Authorizing Provider  ACCU-CHEK AVIVA PLUS test strip Check FSBS three times a day; LON 99 months, DX E11.40 05/10/16   Lada, Satira Anis, MD  ACCU-CHEK SOFTCLIX LANCETS lancets Check FSBS three times a day; LON 99 months, DX E11.40 05/10/16   Lada, Satira Anis, MD  albuterol (PROVENTIL HFA;VENTOLIN HFA) 108 (90 Base) MCG/ACT inhaler Inhale 2 puffs into the lungs every 4 (four) hours as needed for wheezing or shortness of breath. 08/22/17   Hubbard Hartshorn, FNP  alum & mag hydroxide-simeth (MAALOX REGULAR STRENGTH) 200-200-20 MG/5ML suspension Take 15 mLs by mouth every 6 (six) hours as needed for indigestion or heartburn. 06/26/17   Eula Listen, MD  amLODipine (NORVASC) 2.5 MG tablet Take 1 tablet (2.5 mg total) by mouth daily. 06/15/17   Arnetha Courser, MD  aspirin EC 81 MG tablet Take 1 tablet (81 mg total) by mouth daily. 11/03/16   Arnetha Courser, MD  atorvastatin (LIPITOR) 40 MG tablet Take 1 tablet (40 mg total) by mouth at bedtime. 04/13/17   Arnetha Courser, MD  Azelastine-Fluticasone 137-50 MCG/ACT SUSP Place 2 sprays into the nose daily. 08/22/17   Hubbard Hartshorn, FNP  budesonide-formoterol (SYMBICORT) 160-4.5 MCG/ACT inhaler Inhale 2 puffs into the lungs 2 (two) times daily. Every day; rinse mouth out after each use 06/05/17   Lada, Satira Anis, MD  clobetasol cream (TEMOVATE) 5.73 % Apply 1 application  topically 2 (two) times daily. If needed; too strong for face, underarms, groin, children 02/02/17   Lada, Satira Anis, MD  DULoxetine (CYMBALTA) 60 MG capsule Take 120 mg by mouth daily.    [provider]  ezetimibe (ZETIA) 10 MG tablet Take 10 mg by mouth daily.  01/05/17   [provider]  famotidine (PEPCID) 20 MG tablet Take 1 tablet (20 mg total) by mouth 2 (two) times daily. 06/26/17 06/26/18  Eula Listen, MD  gabapentin (NEURONTIN) 300 MG capsule Take 1 capsule (300 mg total) by mouth 2 (two) times daily. 04/11/17   Arnetha Courser, MD  gabapentin (NEURONTIN) 600 MG tablet Take 1 tablet (600 mg total) by mouth 2 (two) times daily. 06/19/17   Edrick Kins, DPM  HUMULIN R U-500 KWIKPEN 500 UNIT/ML kwikpen 60 Units 3 (three) times daily with meals.  12/06/16   [provider]  hydrOXYzine (VISTARIL) 50 MG capsule Take 50 mg daily by mouth. 06/14/17   [provider]  Icosapent Ethyl (VASCEPA) 1 g CAPS Two pills by mouth twice a day for cholesterol (this replaces Lovaza) 09/29/16   Lada, Satira Anis, MD  insulin aspart (NOVOLOG) 100 UNIT/ML injection Short-acting insulin TID ac as follows: 70-150=30units 151-200=35units 201-250=40units 251-300=45units 301-350=50units 351-400=55units 401-450=60units Patient taking differently: Inject 30-60 Units into the skin 3 (three) times daily with meals. Short-acting insulin TID ac as follows: 70-150=30units 151-200=35units 201-250=40units 251-300=45units 301-350=50units 351-400=55units 401-450=60units 09/29/16   Lada, Satira Anis, MD  Insulin Syringe-Needle U-100 (ULTICARE INSULIN SYRINGE) 31G X 1/4" 0.3 ML MISC Inject 1 each into the skin 5 (five) times daily as needed. 09/22/16   Lada, Satira Anis, MD  JARDIANCE 25 MG TABS tablet Take 25 mg by mouth daily. 02/26/17   [provider]  lamoTRIgine (LAMICTAL) 150 MG tablet Take 150 mg by mouth daily.  03/22/15   [provider]  lamoTRIgine (LAMICTAL)  200 MG tablet Take 200 mg daily by mouth. 06/14/17   [provider]  levofloxacin (LEVAQUIN) 500 MG tablet Take 1 tablet (500 mg total) by mouth daily for 10 days. 08/22/17 09/01/17  Hubbard Hartshorn, FNP  levothyroxine (SYNTHROID, LEVOTHROID) 75 MCG tablet Take 1 tablet (75 mcg total) by mouth daily. 10/19/16   Arnetha Courser, MD  linaclotide Rolan Lipa) 72 MCG capsule Take 1 capsule (72 mcg total) by mouth daily before breakfast. 08/21/16   [name], MD  magnesium oxide (MAG-OX) 400 MG tablet Take 1 tablet (400 mg total) by mouth 3 (three) times daily. 11/15/16   Arnetha Courser, MD  metoprolol tartrate (LOPRESSOR) 25 MG tablet Take 0.5 tablets (12.5 mg total) by mouth 2 (two) times daily. 04/20/17   Arnetha Courser, MD  Multiple Vitamin (MULTIVITAMIN) capsule Take 1 capsule by mouth daily.     [provider]  ondansetron (ZOFRAN ODT) 4 MG disintegrating tablet Take 1 tablet (4 mg total) by mouth every 8 (eight) hours as needed for nausea or vomiting. 10/28/16   Earleen Newport, MD  pantoprazole (PROTONIX) 40 MG tablet Take 1 tablet (40 mg total) by mouth daily. 08/21/16   [name], MD  permethrin (ELIMITE) 5 % cream Apply to body, leave in place for 8 hours and then shower clean. 07/17/17 07/17/18  Harvest Dark, MD  QUEtiapine (SEROQUEL) 25 MG tablet Take 25 mg by mouth 2 (two) times daily.     [provider]  QUEtiapine (SEROQUEL) 400 MG tablet Take 800 mg by mouth at bedtime.     [provider]  ranitidine (ZANTAC) 300 MG tablet Take 1 tablet (300 mg total) by mouth at bedtime. 05/15/16   Arnetha Courser, MD  solifenacin (VESICARE) 10 MG tablet Take 10 mg daily by mouth. 02/28/13   [provider]  terconazole (TERAZOL 7) 0.4 % vaginal cream Place 1 applicator vaginally at bedtime. 08/17/17   Hubbard Hartshorn, FNP  zolpidem (AMBIEN) 10 MG tablet Take 10 mg by mouth at bedtime.  07/26/15   [provider]    Allergies Morphine  and related; Codeine; Codeine; Hydrocodone-acetaminophen; Mirtazapine; Morphine and related; Sulfamethoxazole-trimethoprim; and Vicodin [hydrocodone-acetaminophen]  Family History  Problem Relation Age of Onset  . Diabetes Mother   . Heart disease Mother   . Hyperlipidemia Mother   . Hypertension Mother   . Kidney disease Mother   . Mental illness Mother   . Hypothyroidism Mother   . Stroke Mother   . Osteoporosis Mother   . Glaucoma Mother   . Congestive Heart Failure Mother   .  Hypertension Father   . Asthma Daughter   . Cancer Daughter        throat  . Bipolar disorder Daughter   . Heart disease Maternal Grandfather   . Rheum arthritis Maternal Grandfather   . Cancer Maternal Grandfather        liver  . Kidney disease Maternal Grandfather   . Cancer Paternal Grandmother        lung  . Kidney disease Brother   . Bipolar disorder Daughter   . Hypertension Daughter   . Restless legs syndrome Daughter     Social History Social History   Tobacco Use  . Smoking status: Never Smoker  . Smokeless tobacco: Never Used  Substance Use Topics  . Alcohol use: No  . Drug use: No    Review of Systems Constitutional: No fever/chills Eyes: No visual changes. ENT: No sore throat. No stiff neck no neck pain Cardiovascular: Denies chest pain. Respiratory: Denies shortness of breath. Gastrointestinal:   no vomiting.  No diarrhea.  No constipation. Genitourinary: Negative for dysuria. Musculoskeletal: Negative lower extremity swelling Skin: Negative for rash. Neurological: Negative for severe headaches, focal weakness or numbness.   ____________________________________________   PHYSICAL EXAM:  VITAL SIGNS: ED Triage Vitals  Enc Vitals Group     BP 09/01/17 1446 116/74     Pulse Rate 09/01/17 1446 100     Resp 09/01/17 1446 16     Temp 09/01/17 1446 98.5 F (36.9 C)     Temp Source 09/01/17 1446 Oral     SpO2 09/01/17 1446 99 %     Weight 09/01/17 1448 180 lb (81.6  kg)     Height 09/01/17 1448 4' 11"  (1.499 m)     Head Circumference --      Peak Flow --      Pain Score --      Pain Loc --      Pain Edu? --      Excl. in [place]? --     Constitutional: Alert and oriented. Well appearing and in no acute distress. Eyes: Conjunctivae are normal Head: Atraumatic HEENT: No congestion/rhinnorhea. Mucous membranes are moist.  Oropharynx non-erythematous Neck:   Nontender with no meningismus, no masses, no stridor Cardiovascular: Normal rate, regular rhythm. Grossly normal heart sounds.  Good peripheral circulation. Respiratory: Normal respiratory effort.  No retractions. Lungs CTAB. Abdominal: Soft and nontender. No distention. No guarding no rebound Back:  There is no focal tenderness or step off.  there is no midline tenderness there are no lesions noted. there is no CVA tenderness Musculoskeletal: No lower extremity tenderness, no upper extremity tenderness. No joint effusions, no DVT signs strong distal pulses no edema Neurologic:  Normal speech and language. No gross focal neurologic deficits are appreciated.  Skin:  Skin is warm, dry and intact. No rash noted. Psychiatric: Mood and affect are normal. Speech and behavior are normal.  ____________________________________________   LABS (all labs ordered are listed, but only abnormal results are displayed)  Labs Reviewed  BASIC METABOLIC PANEL - Abnormal; Notable for the following components:      Result Value   Sodium 130 (*)    Chloride 93 (*)    Glucose, Bld 364 (*)    Creatinine, Ser 1.15 (*)    GFR calc non Af Amer 53 (*)    All other components within normal limits  CBC - Abnormal; Notable for the following components:   WBC 11.7 (*)    All other components within normal  limits  URINALYSIS, COMPLETE (UACMP) WITH MICROSCOPIC - Abnormal; Notable for the following components:   Color, Urine STRAW (*)    APPearance CLEAR (*)    Specific Gravity, Urine 1.031 (*)    Glucose, UA >=500 (*)     Squamous Epithelial / LPF 6-30 (*)    All other components within normal limits  GLUCOSE, CAPILLARY - Abnormal; Notable for the following components:   Glucose-Capillary 416 (*)    All other components within normal limits  CBG MONITORING, ED    Pertinent labs  results that were available during my care of the patient were reviewed by me and considered in my medical decision making (see chart for details). ____________________________________________  EKG  I personally interpreted any EKGs ordered by me or triage EKG shows normal sinus rhythm at 90 bpm no acute ST elevation or depression nonspecific ST changes no acute ischemia ____________________________________________  RADIOLOGY  Pertinent labs & imaging results that were available during my care of the patient were reviewed by me and considered in my medical decision making (see chart for details). If possible, patient and/or family made aware of any abnormal findings.  No results found. ____________________________________________    PROCEDURES  Procedure(s) performed: None  Procedures  Critical Care performed: None  ____________________________________________   INITIAL IMPRESSION / ASSESSMENT AND PLAN / ED COURSE  Pertinent labs & imaging results that were available during my care of the patient were reviewed by me and considered in my medical decision making (see chart for details).  In no acute distress, no complaints, just feels "dehydrated".  No chest pain or shortness of breath no nausea no vomiting, her blood work does not show any kind of anion gap, her blood glucose is not markedly elevated compared to her baseline, she is feeling better we will give her IV fluid, saphenous her with some regularity and we fluids I think that is appropriate this time as well.  No evidence of occult reaction or other pathology seen today patient very comfortable with this plan.  Signed out to dr. Alfred Levins at the end of my  shift.    ____________________________________________   FINAL CLINICAL IMPRESSION(S) / ED DIAGNOSES  Final diagnoses:  None      This chart was dictated using voice recognition software.  Despite best efforts to proofread,  errors can occur which can change meaning.      Schuyler Amor, MD 09/01/17 2101

## 2017-09-01 NOTE — ED Triage Notes (Addendum)
Pt to ED reporting elevated blood glucose today. Pt reports glucometer at home was reading HIGH (>600) in triage CBG is 416. PT reports she has had increased urine output and increased thirst. Pt reports she has been dizzy and weak today. Pt reports she has been in DKA before and feels as though this is similar to the beginning of that.   Pt reports having taken 60 units of slow acting insulin but did not eat today.

## 2017-09-01 NOTE — ED Notes (Signed)
Signature pad not working. Discharge given and pt states understanding

## 2017-09-10 ENCOUNTER — Ambulatory Visit (INDEPENDENT_AMBULATORY_CARE_PROVIDER_SITE_OTHER): Payer: Medicare HMO | Admitting: Family Medicine

## 2017-09-10 ENCOUNTER — Encounter: Payer: Self-pay | Admitting: Family Medicine

## 2017-09-10 VITALS — BP 124/82 | HR 98 | Temp 98.4°F | Resp 16 | Ht 59.0 in | Wt 197.4 lb

## 2017-09-10 DIAGNOSIS — R531 Weakness: Secondary | ICD-10-CM

## 2017-09-10 DIAGNOSIS — E1140 Type 2 diabetes mellitus with diabetic neuropathy, unspecified: Secondary | ICD-10-CM

## 2017-09-10 DIAGNOSIS — J449 Chronic obstructive pulmonary disease, unspecified: Secondary | ICD-10-CM

## 2017-09-10 DIAGNOSIS — R002 Palpitations: Secondary | ICD-10-CM

## 2017-09-10 DIAGNOSIS — E1165 Type 2 diabetes mellitus with hyperglycemia: Secondary | ICD-10-CM

## 2017-09-10 DIAGNOSIS — R079 Chest pain, unspecified: Secondary | ICD-10-CM | POA: Insufficient documentation

## 2017-09-10 DIAGNOSIS — IMO0002 Reserved for concepts with insufficient information to code with codable children: Secondary | ICD-10-CM

## 2017-09-10 DIAGNOSIS — J0140 Acute pansinusitis, unspecified: Secondary | ICD-10-CM

## 2017-09-10 DIAGNOSIS — G2581 Restless legs syndrome: Secondary | ICD-10-CM

## 2017-09-10 DIAGNOSIS — W19XXXA Unspecified fall, initial encounter: Secondary | ICD-10-CM | POA: Diagnosis not present

## 2017-09-10 DIAGNOSIS — E038 Other specified hypothyroidism: Secondary | ICD-10-CM | POA: Diagnosis not present

## 2017-09-10 DIAGNOSIS — R300 Dysuria: Secondary | ICD-10-CM | POA: Diagnosis not present

## 2017-09-10 DIAGNOSIS — K7581 Nonalcoholic steatohepatitis (NASH): Secondary | ICD-10-CM | POA: Diagnosis not present

## 2017-09-10 LAB — POCT URINALYSIS DIPSTICK
Bilirubin, UA: NEGATIVE
Blood, UA: NEGATIVE
Glucose, UA: 1000
KETONES UA: NEGATIVE
LEUKOCYTES UA: NEGATIVE
NITRITE UA: NEGATIVE
PH UA: 5 (ref 5.0–8.0)
PROTEIN UA: NEGATIVE
Spec Grav, UA: 1.02 (ref 1.010–1.025)
UROBILINOGEN UA: NEGATIVE U/dL — AB

## 2017-09-10 NOTE — Progress Notes (Signed)
Cardiology Office Note  Date:  09/11/2017   ID:  [name], [name] February 28, 1966, MRN [id number]  PCP:  Arnetha Courser, MD   Chief Complaint  Patient presents with  . Other    New patient. Patient c/oPalpitations, chest pain when laying down, and weakness. Patient states she has fell 3 times in the past week. Meds reviewed verbally with patient.     HPI:  [name] is a 52 year old woman with past medical history of uncontrolled T2DM,  Hypothyroidism,  NASH,  Restless Leg Syndrome Chronic back pain Sinusitis Who presents by referral from Dr. Sanda Yang for new patient evaluation of her palpitations, chest pain  ER 06/26/2017: chest pain burning central chest pain which is nonradiating, going on since this morning. Constant for the past 8-9 hours. Worse lying flat, better  sitting upright.  somewhat worsened by deep breathing. Not exertional. No fevers chills or cough. Mild to moderate intensity.  Delta troponin negative, d-dimer negative. Patient feeling fine, vital stable.    hospitalized 05/2017 pneumonia. Pleuritic chest pain  ER 07/17/17 for hyperglycemia ER for hyperglycemia and dehydration on 09/01/2017.   3 falls since her ER visit because he legs have felt shaky.   denies dizziness/lightheadedness, no loss of consciousness  legs just give out from under her sometimes.  On insulin, does not remember how much or what kind This is done by Dr. Francoise Yang  Lab work reviewed with her today, urine with glucose of 1000 Blood pressure running low today but denies orthostasis  EKG personally reviewed by myself on todays visit Shows normal sinus rhythm rate 86 bpm no significant ST or T wave changes  CT scan chest September 2018 reviewed with her in detail Unable to exclude mild coronary calcification mid LAD otherwise no aortic atherosclerosis   PMH:   has a past medical history of Asthma, Bipolar depression ([place]), CHF (congestive heart failure) ([place]), Chronic headaches, COPD  (chronic obstructive pulmonary disease) ([place]), Diabetic neuropathy ([place]), GERD (gastroesophageal reflux disease), Headache, Heart attack ([place]), Hyperlipidemia, Hypertension, Hypomagnesemia, Hypothyroid, Irregular heart rhythm, Liver lesion, Morbid obesity ([place]), OSA (obstructive sleep apnea), Pinched nerve, Pneumonia, RLS (restless legs syndrome), Seasonal allergies, Sleep apnea, Type 2 diabetes, uncontrolled, with neuropathy ([place]) (10/15/2015), Urinary incontinence, and Vitamin D deficiency.  PSH:    Past Surgical History:  Procedure Laterality Date  . ABDOMINAL HYSTERECTOMY    . ABDOMINAL HYSTERECTOMY     Partial  . BLADDER SURGERY     x 2  . CHOLECYSTECTOMY    . LIVER BIOPSY  March 2016   Negative  . LUNG BIOPSY    . TUBAL LIGATION      Current Outpatient Medications  Medication Sig Dispense Refill  . amLODipine (NORVASC) 2.5 MG tablet Take 1 tablet (2.5 mg total) by mouth daily. 30 tablet 11  . aspirin EC 81 MG tablet Take 1 tablet (81 mg total) by mouth daily.    Marland Kitchen atorvastatin (LIPITOR) 40 MG tablet Take 1 tablet (40 mg total) by mouth at bedtime. 90 tablet 1  . DULoxetine (CYMBALTA) 60 MG capsule Take 120 mg by mouth daily.    Marland Kitchen ezetimibe (ZETIA) 10 MG tablet Take 10 mg by mouth daily.     Marland Kitchen gabapentin (NEURONTIN) 300 MG capsule Take 1 capsule (300 mg total) by mouth 2 (two) times daily. 60 capsule 5  . hydrOXYzine (VISTARIL) 50 MG capsule Take 50 mg daily by mouth.    . lamoTRIgine (LAMICTAL) 150 MG tablet Take 150 mg by mouth daily.     Marland Kitchen  lamoTRIgine (LAMICTAL) 200 MG tablet Take 200 mg daily by mouth.    . levothyroxine (SYNTHROID, LEVOTHROID) 75 MCG tablet Take 1 tablet (75 mcg total) by mouth daily. 30 tablet 11  . linaclotide (LINZESS) 72 MCG capsule Take 1 capsule (72 mcg total) by mouth daily before breakfast. 90 capsule 3  . metoprolol tartrate (LOPRESSOR) 25 MG tablet Take 0.5 tablets (12.5 mg total) by mouth 2 (two) times daily. 30 tablet 11  . pantoprazole (PROTONIX)  40 MG tablet Take 1 tablet (40 mg total) by mouth daily. 90 tablet 3  . traZODone (DESYREL) 50 MG tablet Take 50 mg by mouth 2 (two) times daily.     Current Facility-Administered Medications  Medication Dose Route Frequency Provider Last Rate Last Dose  . betamethasone acetate-betamethasone sodium phosphate (CELESTONE) injection 3 mg  3 mg Intramuscular Once Daylene Katayama M, DPM      . betamethasone acetate-betamethasone sodium phosphate (CELESTONE) injection 3 mg  3 mg Intramuscular Once Edrick Kins, DPM         Allergies:   Morphine and related; Codeine; Codeine; Hydrocodone-acetaminophen; Mirtazapine; Morphine and related; Sulfamethoxazole-trimethoprim; and Vicodin [hydrocodone-acetaminophen]   Social History:  The patient  reports that  has never smoked. she has never used smokeless tobacco. She reports that she does not drink alcohol or use drugs.   Family History:   family history includes Asthma in her daughter; Bipolar disorder in her daughter and daughter; Cancer in her daughter, maternal grandfather, and paternal grandmother; Congestive Heart Failure in her mother; Diabetes in her mother; Glaucoma in her mother; Heart disease in her maternal grandfather and mother; Hyperlipidemia in her mother; Hypertension in her daughter, father, and mother; Hypothyroidism in her mother; Kidney disease in her brother, maternal grandfather, and mother; Mental illness in her mother; Osteoporosis in her mother; Restless legs syndrome in her daughter; Rheum arthritis in her maternal grandfather; Stroke in her mother.    Review of Systems: Review of Systems  Constitutional: Positive for malaise/fatigue.  Respiratory: Negative.   Cardiovascular: Positive for chest pain and palpitations.  Gastrointestinal: Negative.   Musculoskeletal: Negative.        Leg weakness  Neurological: Positive for weakness.       Legs and feet are normal  Psychiatric/Behavioral: Negative.   All other systems reviewed  and are negative.    PHYSICAL EXAM: VS:  BP 98/66 (BP Location: Right Arm, Patient Position: Sitting, Cuff Size: Large)   Pulse 86   Ht 4' 11"  (1.499 m)   Wt 195 lb (88.5 kg)   BMI 39.39 kg/m  , BMI Body mass index is 39.39 kg/m. GEN: Well nourished, well developed, in no acute distress , obese HEENT: normal  Neck: no JVD, carotid bruits, or masses Cardiac: RRR; no murmurs, rubs, or gallops,no edema  Respiratory:  clear to auscultation bilaterally, normal work of breathing GI: soft, nontender, nondistended, + BS MS: no deformity or atrophy  Skin: warm and dry, no rash Neuro:  Strength and sensation are intact Psych: euthymic mood, full affect    Recent Labs: 07/14/2017: ALT 25 09/01/2017: BUN 20; Creatinine, Ser 1.15; Potassium 3.6; Sodium 130 09/10/2017: Hemoglobin 14.0; Magnesium 2.0; Platelets 301; TSH 0.94    Lipid Panel Lab Results  Component Value Date   CHOL 196 09/28/2016   HDL 33 (L) 09/28/2016   LDLCALC NOT CALC 09/28/2016   TRIG 856 (H) 09/28/2016      Wt Readings from Last 3 Encounters:  09/11/17 195 lb (88.5 kg)  09/10/17  197 lb 6.4 oz (89.5 kg)  09/01/17 180 lb (81.6 kg)       ASSESSMENT AND PLAN:  Type 2 diabetes, uncontrolled, with neuropathy ([place]) Long discussion concerning her numbers, long history of poorly controlled diabetes Suspect insulin noncompliance, dietary noncompliance Frequent episodes of hyperglycemia and dehydration Likely the case today as blood pressure is running low but she is asymptomatic  Chest pain, unspecified type - Plan: EKG 12-Lead Pleuritic in nature, atypical CT scan reviewed showing no significant coronary disease We have [facility] cardiac CT, stress test, echocardiogram, treadmill.  At these have been requested  HYPERTENSION, BENIGN ESSENTIAL Blood pressure low on today's visit Likely secondary to markedly elevated glucose in her urine, measured at 1000 Concern for  dehydration Strongly recommended she get her diabetes under control, change her diet, compliance with her insulin, stay hydrated with 0-calorie water drinks Recommended she hold her amlodipine Complaints of palpitations in the day likely secondary to dehydration, we will increase the metoprolol up to 25 twice daily  Mixed hyperlipidemia Strongly recommended better diabetes control Would stay on her cholesterol medications  Chronic obstructive pulmonary disease, unspecified COPD type ([place]) Stable breathing on today's visit  Obstructive sleep apnea We have encouraged continued exercise, careful diet management in an effort to lose weight.  Chronic kidney disease, stage III (moderate) (HCC) Lab work pending, stable renal function Long history of poorly controlled diabetes  Multifocal pneumonia Pneumonia resolved, previously with pleuritic pain and cough  Disposition:   F/U as needed   Total encounter time more than 60 minutes  Greater than 50% was spent in counseling and coordination of care with the patient    Orders Placed This Encounter  Procedures  . EKG 12-Lead     Signed, Esmond Plants, M.D., Ph.D. 09/11/2017  [place], [place]

## 2017-09-10 NOTE — Progress Notes (Signed)
Name: [name]   MRN: [id number]    DOB: Jan 05, 1966   Date:09/10/2017       Progress Note  Subjective  Chief Complaint  Chief Complaint  Patient presents with  . Sinusitis    hard to breath  . Extremity Weakness    HPI  Patient presents with concern for ongoing sinusitis. Was seen and treated with Augmentin on 08/16/2017 and again on 08/22/2017 with Levaquin as secondary treatment. She reports ongoing sinus pressure in maxillary and frontal sinuses, also reports ongoing drainage. She finished the Levaquin completely without issue.  She is using flonase daily; takes benadryl daily.  She has seen Dr. Virgia Land ENT in the past, we will refer back today.  She also notes feeling quite weak since she came home from the ER for hyperglycemia and dehydration on 09/01/2017.  She reports having 3 falls since her ER visit because he legs have felt shaky.  She denies dizziness/lightheadedness, no loss of consciousness during these falls - she states her legs just give out from under her sometimes.  Endorses some RIGHT knee pain - denies any other injuries; endorses palpitations when she starts feeling really weak. Endorses nausea but not particular pattern to this.  Endorses urinary frequency/urgency, and dysuria.  She keeps her cell phone on her person at all times.  Some pain with breathing, but no chest pain or shortness of breath.  Chronic back pain has been under good control.  Patient has uncontrolled T2DM, Hypothyroidism, NASH, Restless Leg Syndrome  Patient Active Problem List   Diagnosis Date Noted  . Lobar pneumonia ([place]) 06/05/2017  . Multifocal pneumonia 05/27/2017  . Pleuritic chest pain 05/27/2017  . Hypertriglyceridemia 12/21/2016  . Pansinusitis 11/15/2016  . 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 Breast cancer screening 09/28/2016  . Urine frequency 09/28/2016  . Preventative health care 09/28/2016  . Fatigue 05/01/2016  . Urinary tract infection 03/14/2016  .  Adenomatous colon polyp 10/15/2015  . Airway hyperreactivity 10/15/2015  . Bipolar affective disorder ([place]) 10/15/2015  . CN (constipation) 10/15/2015  . CAFL (chronic airflow limitation) ([place]) 10/15/2015  . Drug-induced hepatic toxicity 10/15/2015  . Blood in feces 10/15/2015  . HLD (hyperlipidemia) 10/15/2015  . Hypoxemia 10/15/2015  . NASH (nonalcoholic steatohepatitis) 10/15/2015  . Morbid obesity ([place]) 10/15/2015  . Restless leg syndrome 10/15/2015  . Type 2 diabetes, uncontrolled, with neuropathy ([place]) 10/15/2015  . Elevated alkaline phosphatase level 09/22/2015  . Urinary bladder incontinence 09/22/2015  . Low back pain with left-sided sciatica 08/18/2015  . Encounter for monitoring tricyclic antidepressant therapy 05/13/2015  . Bipolar disorder ([place]) 05/11/2015  . Liver disease 05/11/2015  . Apnea, sleep 05/05/2015  . Heart valve disease 05/05/2015  . Chronic kidney disease, stage III (moderate) (HCC) 03/15/2015  . Migraine 03/12/2015  . COPD (chronic obstructive pulmonary disease) ([place]) 01/20/2015  . 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 GERD (gastroesophageal reflux disease) 01/20/2015  . Hypothyroid   . Vitamin D deficiency   . Urinary incontinence   . Liver lesion   . Hypomagnesemia   . Obstructive sleep apnea 10/20/2013  . PERIPHERAL NEUROPATHY 03/04/2009  . HEMORRHOIDS, EXTERNAL 09/03/2008  . ALLERGIC RHINITIS 07/04/2007  . ASTHMA, PERSISTENT, MODERATE 07/04/2007  . INTERSTITIAL CYSTITIS 07/04/2007  . HYPERTENSION, BENIGN ESSENTIAL 06/19/2007    Social History   Tobacco Use  . Smoking status: Never Smoker  . Smokeless tobacco: Never Used  Substance Use Topics  . Alcohol use: No  Current Outpatient Medications:  .  ACCU-CHEK AVIVA PLUS test strip, Check FSBS three times a day; LON 99 months, DX E11.40, Disp: 100 each, Rfl: 3 .  ACCU-CHEK SOFTCLIX LANCETS lancets, Check FSBS three times a day; LON 99 months, DX E11.40, Disp: 100 each, Rfl:  3 .  albuterol (PROVENTIL HFA;VENTOLIN HFA) 108 (90 Base) MCG/ACT inhaler, Inhale 2 puffs into the lungs every 4 (four) hours as needed for wheezing or shortness of breath., Disp: 1 Inhaler, Rfl: 1 .  alum & mag hydroxide-simeth (MAALOX REGULAR STRENGTH) 200-200-20 MG/5ML suspension, Take 15 mLs by mouth every 6 (six) hours as needed for indigestion or heartburn., Disp: 150 mL, Rfl: 0 .  amLODipine (NORVASC) 2.5 MG tablet, Take 1 tablet (2.5 mg total) by mouth daily., Disp: 30 tablet, Rfl: 11 .  aspirin EC 81 MG tablet, Take 1 tablet (81 mg total) by mouth daily., Disp: , Rfl:  .  atorvastatin (LIPITOR) 40 MG tablet, Take 1 tablet (40 mg total) by mouth at bedtime., Disp: 90 tablet, Rfl: 1 .  Azelastine-Fluticasone 137-50 MCG/ACT SUSP, Place 2 sprays into the nose daily., Disp: 23 g, Rfl: 0 .  budesonide-formoterol (SYMBICORT) 160-4.5 MCG/ACT inhaler, Inhale 2 puffs into the lungs 2 (two) times daily. Every day; rinse mouth out after each use, Disp: 1 Inhaler, Rfl: 11 .  clobetasol cream (TEMOVATE) 6.30 %, Apply 1 application topically 2 (two) times daily. If needed; too strong for face, underarms, groin, children, Disp: 30 g, Rfl: 0 .  DULoxetine (CYMBALTA) 60 MG capsule, Take 120 mg by mouth daily., Disp: , Rfl:  .  ezetimibe (ZETIA) 10 MG tablet, Take 10 mg by mouth daily. , Disp: , Rfl:  .  famotidine (PEPCID) 20 MG tablet, Take 1 tablet (20 mg total) by mouth 2 (two) times daily., Disp: 60 tablet, Rfl: 0 .  gabapentin (NEURONTIN) 300 MG capsule, Take 1 capsule (300 mg total) by mouth 2 (two) times daily., Disp: 60 capsule, Rfl: 5 .  gabapentin (NEURONTIN) 600 MG tablet, Take 1 tablet (600 mg total) by mouth 2 (two) times daily., Disp: 60 tablet, Rfl: 2 .  HUMULIN R U-500 KWIKPEN 500 UNIT/ML kwikpen, 60 Units 3 (three) times daily with meals. , Disp: , Rfl:  .  hydrOXYzine (VISTARIL) 50 MG capsule, Take 50 mg daily by mouth., Disp: , Rfl:  .  Icosapent Ethyl (VASCEPA) 1 g CAPS, Two pills by mouth  twice a day for cholesterol (this replaces Lovaza), Disp: 120 capsule, Rfl: 11 .  insulin aspart (NOVOLOG) 100 UNIT/ML injection, Short-acting insulin TID ac as follows: 70-150=30units 151-200=35units 201-250=40units 251-300=45units 301-350=50units 351-400=55units 401-450=60units (Patient taking differently: Inject 30-60 Units into the skin 3 (three) times daily with meals. Short-acting insulin TID ac as follows: 70-150=30units 151-200=35units 201-250=40units 251-300=45units 301-350=50units 351-400=55units 401-450=60units), Disp: 5 vial, Rfl: 1 .  Insulin Syringe-Needle U-100 (ULTICARE INSULIN SYRINGE) 31G X 1/4" 0.3 ML MISC, Inject 1 each into the skin 5 (five) times daily as needed., Disp: 200 each, Rfl: 0 .  JARDIANCE 25 MG TABS tablet, Take 25 mg by mouth daily., Disp: , Rfl:  .  lamoTRIgine (LAMICTAL) 150 MG tablet, Take 150 mg by mouth daily. , Disp: , Rfl:  .  lamoTRIgine (LAMICTAL) 200 MG tablet, Take 200 mg daily by mouth., Disp: , Rfl:  .  levothyroxine (SYNTHROID, LEVOTHROID) 75 MCG tablet, Take 1 tablet (75 mcg total) by mouth daily., Disp: 30 tablet, Rfl: 11 .  linaclotide (LINZESS) 72 MCG capsule, Take 1 capsule (72 mcg total)  by mouth daily before breakfast., Disp: 90 capsule, Rfl: 3 .  magnesium oxide (MAG-OX) 400 MG tablet, Take 1 tablet (400 mg total) by mouth 3 (three) times daily., Disp: 90 tablet, Rfl: 2 .  metoprolol tartrate (LOPRESSOR) 25 MG tablet, Take 0.5 tablets (12.5 mg total) by mouth 2 (two) times daily., Disp: 30 tablet, Rfl: 11 .  Multiple Vitamin (MULTIVITAMIN) capsule, Take 1 capsule by mouth daily. , Disp: , Rfl:  .  ondansetron (ZOFRAN ODT) 4 MG disintegrating tablet, Take 1 tablet (4 mg total) by mouth every 8 (eight) hours as needed for nausea or vomiting., Disp: 20 tablet, Rfl: 0 .  pantoprazole (PROTONIX) 40 MG tablet, Take 1 tablet (40 mg total) by mouth daily., Disp: 90 tablet, Rfl: 3 .  permethrin (ELIMITE) 5 % cream, Apply to body, leave in place for 8 hours  and then shower clean., Disp: 60 g, Rfl: 1 .  QUEtiapine (SEROQUEL) 25 MG tablet, Take 25 mg by mouth 2 (two) times daily. , Disp: , Rfl:  .  QUEtiapine (SEROQUEL) 400 MG tablet, Take 800 mg by mouth at bedtime. , Disp: , Rfl:  .  ranitidine (ZANTAC) 300 MG tablet, Take 1 tablet (300 mg total) by mouth at bedtime., Disp: 30 tablet, Rfl: 5 .  solifenacin (VESICARE) 10 MG tablet, Take 10 mg daily by mouth., Disp: , Rfl:  .  terconazole (TERAZOL 7) 0.4 % vaginal cream, Place 1 applicator vaginally at bedtime., Disp: 45 g, Rfl: 0 .  zolpidem (AMBIEN) 10 MG tablet, Take 10 mg by mouth at bedtime. , Disp: , Rfl:   Current Facility-Administered Medications:  .  betamethasone acetate-betamethasone sodium phosphate (CELESTONE) injection 3 mg, 3 mg, Intramuscular, Once, Evans, Brent M, DPM .  betamethasone acetate-betamethasone sodium phosphate (CELESTONE) injection 3 mg, 3 mg, Intramuscular, Once, Edrick Kins, DPM  Allergies  Allergen Reactions  . Morphine And Related     anaphylaxis  . Codeine     REACTION: itch, rash  . Codeine     itching  . Hydrocodone-Acetaminophen     REACTION: rash  . Mirtazapine     REACTION: closes throat  . Morphine And Related   . Sulfamethoxazole-Trimethoprim     REACTION: [place] all over  . Vicodin [Hydrocodone-Acetaminophen]     itching    ROS  Constitutional: Negative for fever or weight change - ER visit notes weight of 180lbs which appears to be inaccurate (pt notes she was not actually weighed while there) as pt was 198lbs on 08/22/2017 and is 197.4lbs today.   Respiratory: Endorses cough; no shortness of breath.   Cardiovascular: See HPI Gastrointestinal: Negative for abdominal pain, vomiting/diarrhea, no bowel changes. Musculoskeletal: Negative for gait problem or joint swelling.  RIGHT knee pain per HPI  Skin: Negative for rash.  Neurological: Negative for dizziness; endorses headache.  No other specific complaints in a complete review of  systems (except as listed in HPI above).  Objective  Vitals:   09/10/17 1106  BP: 124/82  Pulse: 98  Resp: 16  Temp: 98.4 F (36.9 C)  TempSrc: Oral  SpO2: 98%  Weight: 197 lb 6.4 oz (89.5 kg)  Height: 4' 11"  (1.499 m)   Body mass index is 39.87 kg/m.  Nursing Note and Vital Signs reviewed.  Physical Exam  Constitutional: Patient appears well-developed and well-nourished. Obese No distress.  HEENT: head atraumatic, normocephalic, pupils equal and reactive to light, EOM's intact, TM's without erythema or bulging, maxillary and frontal sinus tenderness still present, neck  supple without lymphadenopathy, oropharynx pink and moist without exudate Cardiovascular: Normal rate, regular rhythm, S1/S2 present.  No murmur or rub heard. No BLE edema. Pulmonary/Chest: Effort normal and breath sounds clear. No respiratory distress or retractions. Abdominal: Soft and mild RUQ tenderness without guarding or masses palpated, bowel sounds present x4 quadrants.  No CVA tenderness. Psychiatric: Patient has a normal mood and affect. behavior is normal. Judgment and thought content normal. Musculoskeletal: Normal range of motion, no joint effusions. No gross deformities. Mild medial tenderness to the RIGHT knee. Neurological: he is alert and oriented to person, place, and time. No cranial nerve deficit. Coordination, balance, strength, speech and gait are normal.  Skin: Skin is warm and dry. No rash noted. No erythema.  Psychiatric: Patient has a normal mood and affect. behavior is normal. Judgment and thought content normal.  Results for orders placed or performed in visit on 09/10/17 (from the past 24 hour(s))  POCT urinalysis dipstick     Status: Abnormal   Collection Time: 09/10/17 11:47 AM  Result Value Ref Range   Color, UA yellow    Clarity, UA clear    Glucose, UA 1,000    Bilirubin, UA negative    Ketones, UA negative    Spec Grav, UA 1.020 1.010 - 1.025   Blood, UA negative    pH,  UA 5.0 5.0 - 8.0   Protein, UA negative    Urobilinogen, UA negative (A) 0.2 or 1.0 E.U./dL   Nitrite, UA negative    Leukocytes, UA Negative Negative   Appearance clear    Odor strong     Assessment & Plan  1. Fall, initial encounter - TSH - COMPLETE METABOLIC PANEL WITH GFR - CBC w/Diff/Platelet - Magnesium - Discussed safe home environment - remove throw rugs, have good lighting and railings on steps; always carry cell phone with her.  2. Subacute pansinusitis - Ambulatory referral to ENT - CBC w/Diff/Platelet  3. Weakness - TSH - COMPLETE METABOLIC PANEL WITH GFR - CBC w/Diff/Platelet - Magnesium - Ambulatory referral to Cardiology - POCT urinalysis dipstick - Urine Culture  4. Type 2 diabetes, uncontrolled, with neuropathy (HCC) - COMPLETE METABOLIC PANEL WITH GFR - Again reinforced need for well-controlled BG's to help prevent serious infections.  5. Other specified hypothyroidism - TSH  6. NASH (nonalcoholic steatohepatitis) - COMPLETE METABOLIC PANEL WITH GFR  7. Chronic obstructive pulmonary disease, unspecified COPD type ([place]) - Continue daily medications.  May contribute to her feeling of weakness.  8. Restless leg syndrome - Continue Gabapentin, we will check mag level today.  9. Heart palpitations - Ambulatory referral to Cardiology  10. Hypomagnesemia - Magnesium  11. Dysuria - POCT urinalysis dipstick- Negative today aside from strong odor and glucose. We will send for culture. - Urine Culture  - Discussed case with PCP [name] who is in agreement with plan of care. -Red flags and when to present for emergency care or RTC including fever >101.7F, chest pain, shortness of breath, fall, new/worsening/un-resolving symptoms, reviewed with patient at time of visit. Follow up and care instructions discussed and provided in AVS.

## 2017-09-11 ENCOUNTER — Encounter: Payer: Self-pay | Admitting: Cardiovascular Disease

## 2017-09-11 ENCOUNTER — Ambulatory Visit (INDEPENDENT_AMBULATORY_CARE_PROVIDER_SITE_OTHER): Payer: Medicare HMO | Admitting: Cardiovascular Disease

## 2017-09-11 VITALS — BP 98/66 | HR 86 | Ht 59.0 in | Wt 195.0 lb

## 2017-09-11 DIAGNOSIS — I10 Essential (primary) hypertension: Secondary | ICD-10-CM | POA: Diagnosis not present

## 2017-09-11 DIAGNOSIS — E782 Mixed hyperlipidemia: Secondary | ICD-10-CM | POA: Diagnosis not present

## 2017-09-11 DIAGNOSIS — J189 Pneumonia, unspecified organism: Secondary | ICD-10-CM

## 2017-09-11 DIAGNOSIS — J188 Other pneumonia, unspecified organism: Secondary | ICD-10-CM

## 2017-09-11 DIAGNOSIS — J449 Chronic obstructive pulmonary disease, unspecified: Secondary | ICD-10-CM | POA: Diagnosis not present

## 2017-09-11 DIAGNOSIS — E1140 Type 2 diabetes mellitus with diabetic neuropathy, unspecified: Secondary | ICD-10-CM

## 2017-09-11 DIAGNOSIS — G4733 Obstructive sleep apnea (adult) (pediatric): Secondary | ICD-10-CM

## 2017-09-11 DIAGNOSIS — IMO0002 Reserved for concepts with insufficient information to code with codable children: Secondary | ICD-10-CM

## 2017-09-11 DIAGNOSIS — E1165 Type 2 diabetes mellitus with hyperglycemia: Secondary | ICD-10-CM

## 2017-09-11 DIAGNOSIS — R079 Chest pain, unspecified: Secondary | ICD-10-CM | POA: Diagnosis not present

## 2017-09-11 DIAGNOSIS — N183 Chronic kidney disease, stage 3 unspecified: Secondary | ICD-10-CM

## 2017-09-11 LAB — URINE CULTURE
MICRO NUMBER:: 90023755
SPECIMEN QUALITY:: ADEQUATE

## 2017-09-11 MED ORDER — METOPROLOL TARTRATE 25 MG PO TABS: 25.0000 mg | ORAL_TABLET | Freq: Two times a day (BID) | ORAL | 6 refills | Status: DC

## 2017-09-11 NOTE — Patient Instructions (Addendum)
Medication Instructions:   Please hold the amlodipine, BP is low  Please increase the metoprolol up to 1 whole pill in the AM, 1/2 at night  Labwork:  No new labs needed  Testing/Procedures:  No further testing at this time   Follow-Up: It was a pleasure seeing you in the office today. Please call us if you have new issues that need to be addressed before your next appt.  [phone]  Your physician wants you to follow-up in: as needed  If you need a refill on your cardiac medications before your next appointment, please call your pharmacy.

## 2017-09-12 LAB — CBC WITH DIFFERENTIAL/PLATELET
BASOS PCT: 0.5 %
Basophils Absolute: 45 cells/uL (ref 0–200)
Eosinophils Absolute: 205 cells/uL (ref 15–500)
Eosinophils Relative: 2.3 %
HEMATOCRIT: 42.2 % (ref 35.0–45.0)
HEMOGLOBIN: 14 g/dL (ref 11.7–15.5)
LYMPHS ABS: 3729 {cells}/uL (ref 850–3900)
MCH: 27.8 pg (ref 27.0–33.0)
MCHC: 33.2 g/dL (ref 32.0–36.0)
MCV: 83.9 fL (ref 80.0–100.0)
MPV: 9.7 fL (ref 7.5–12.5)
Monocytes Relative: 6.5 %
NEUTROS ABS: 4343 {cells}/uL (ref 1500–7800)
Neutrophils Relative %: 48.8 %
PLATELETS: 301 10*3/uL (ref 140–400)
RBC: 5.03 10*6/uL (ref 3.80–5.10)
RDW: 12.5 % (ref 11.0–15.0)
TOTAL LYMPHOCYTE: 41.9 %
WBC: 8.9 10*3/uL (ref 3.8–10.8)
WBCMIX: 579 {cells}/uL (ref 200–950)

## 2017-09-12 LAB — ADD ON CMP
AG Ratio: 1.8 (calc) (ref 1.0–2.5)
ALBUMIN MSPROF: 4.5 g/dL (ref 3.6–5.1)
ALKALINE PHOSPHATASE (APISO): 98 U/L (ref 33–130)
ALT: 17 U/L (ref 6–29)
AST: 17 U/L (ref 10–35)
BUN/Creatinine Ratio: 23 (calc) — ABNORMAL HIGH (ref 6–22)
BUN: 32 mg/dL — AB (ref 7–25)
CO2: 19 mmol/L — AB (ref 20–32)
CREATININE: 1.38 mg/dL — AB (ref 0.50–1.05)
Calcium: 10.4 mg/dL (ref 8.6–10.4)
Chloride: 101 mmol/L (ref 98–110)
GFR, EST NON AFRICAN AMERICAN: 44 mL/min/{1.73_m2} — AB (ref >=60)
GFR, Est African American: 50 mL/min/{1.73_m2} — ABNORMAL LOW (ref >=60)
GLUCOSE: 202 mg/dL — AB (ref 65–99)
Globulin: 2.5 g/dL (calc) (ref 1.9–3.7)
Potassium: 4.5 mmol/L (ref 3.5–5.3)
Sodium: 142 mmol/L (ref 135–146)
Total Bilirubin: 0.2 mg/dL (ref 0.2–1.2)
Total Protein: 7 g/dL (ref 6.1–8.1)

## 2017-09-12 LAB — TEST AUTHORIZATION

## 2017-09-12 LAB — TSH: TSH: 0.94 mIU/L

## 2017-09-12 LAB — MAGNESIUM: Magnesium: 2 mg/dL (ref 1.5–2.5)

## 2017-09-18 ENCOUNTER — Telehealth: Payer: Self-pay | Admitting: Family Medicine

## 2017-09-18 ENCOUNTER — Other Ambulatory Visit: Payer: Self-pay | Admitting: Family Medicine

## 2017-09-18 DIAGNOSIS — M25561 Pain in right knee: Secondary | ICD-10-CM

## 2017-09-18 DIAGNOSIS — Z9181 History of falling: Secondary | ICD-10-CM

## 2017-09-18 NOTE — Telephone Encounter (Signed)
Review of documentation shows that she was having RIGHT knee pain s/p recent fall, and declined Xray at that time.  Xray is now ordered - she may go to the outpatient imaging center on [address] at her convenience.  If Xray is negative and she continues to have pain, she needs to follow up with [name]. I will send note to PCP [name] as she is seeing pt for follow up on 09/21/2017.

## 2017-09-18 NOTE — Telephone Encounter (Signed)
Copied from [place] [id number]. Topic: Quick Communication - See Telephone Encounter >> Sep 18, 2017 11:26 AM Robina Ade, Helene Kelp D wrote: Patient called and said she saw Emily B. For knee pain and said that if she wasn't better to call so she can request xray for her. Patient would like to go ahead with this process. Please call patient back, thanks. CRM for notification. See Telephone encounter for: 09/18/17.

## 2017-09-18 NOTE — Telephone Encounter (Signed)
Please advise 

## 2017-09-21 ENCOUNTER — Ambulatory Visit
Admission: RE | Admit: 2017-09-21 | Discharge: 2017-09-21 | Disposition: A | Discharge status: Home / Self Care | Payer: Medicare HMO | Source: Ambulatory Visit | Attending: Family Medicine | Admitting: Family Medicine

## 2017-09-21 ENCOUNTER — Ambulatory Visit (INDEPENDENT_AMBULATORY_CARE_PROVIDER_SITE_OTHER): Payer: Medicare HMO | Admitting: Family Medicine

## 2017-09-21 ENCOUNTER — Encounter: Payer: Self-pay | Admitting: Family Medicine

## 2017-09-21 ENCOUNTER — Other Ambulatory Visit: Payer: Self-pay

## 2017-09-21 VITALS — BP 122/76 | HR 92 | Temp 98.2°F | Resp 14 | Wt 201.9 lb

## 2017-09-21 DIAGNOSIS — M25561 Pain in right knee: Secondary | ICD-10-CM | POA: Insufficient documentation

## 2017-09-21 DIAGNOSIS — E1140 Type 2 diabetes mellitus with diabetic neuropathy, unspecified: Secondary | ICD-10-CM | POA: Diagnosis not present

## 2017-09-21 DIAGNOSIS — E1165 Type 2 diabetes mellitus with hyperglycemia: Secondary | ICD-10-CM

## 2017-09-21 DIAGNOSIS — IMO0002 Reserved for concepts with insufficient information to code with codable children: Secondary | ICD-10-CM

## 2017-09-21 MED ORDER — GABAPENTIN 300 MG PO CAPS: 300.0000 mg | ORAL_CAPSULE | Freq: Two times a day (BID) | ORAL | 5 refills | Status: DC

## 2017-09-21 MED ORDER — TRAMADOL HCL 50 MG PO TABS: 50.0000 mg | ORAL_TABLET | Freq: Four times a day (QID) | ORAL | 0 refills | Status: DC | PRN

## 2017-09-21 NOTE — Patient Instructions (Addendum)
Please have the xrays done today You can use ice topically for 20 minutes at a time, with protective cloth in between the skin and ice Use the pain medicine if really needed (no alcohol or nerve pills with that)   Knee Pain, Adult Many things can cause knee pain. The pain often goes away on its own with time and rest. If the pain does not go away, tests may be done to find out what is causing the pain. Follow these instructions at home: Activity  Rest your knee.  Do not do things that cause pain.  Avoid activities where both feet leave the ground at the same time (high-impact activities). Examples are running, jumping rope, and doing jumping jacks. General instructions  Take medicines only as told by your doctor.  Raise (elevate) your knee when you are resting. Make sure your knee is higher than your heart.  Sleep with a pillow under your knee.  If told, put ice on the knee: ? Put ice in a plastic bag. ? Place a towel between your skin and the bag. ? Leave the ice on for 20 minutes, 2-3 times a day.  Ask your doctor if you should wear an elastic knee support.  Lose weight if you are overweight. Being overweight can make your knee hurt more.  Do not use any tobacco products. These include cigarettes, chewing tobacco, or electronic cigarettes. If you need help quitting, ask your doctor. Smoking may slow down healing. Contact a doctor if:  The pain does not stop.  The pain changes or gets worse.  You have a fever along with knee pain.  Your knee gives out or locks up.  Your knee swells, and becomes worse. Get help right away if:  Your knee feels warm.  You cannot move your knee.  You have very bad knee pain.  You have chest pain.  You have trouble breathing. Summary  Many things can cause knee pain. The pain often goes away on its own with time and rest.  Avoid activities that put stress on your knee. These include running and jumping rope.  Get help right  away if you cannot move your knee, or if your knee feels warm, or if you have trouble breathing. This information is not intended to replace advice given to you by your health care provider. Make sure you discuss any questions you have with your health care provider. Document Released: 11/17/2008 Document Revised: 08/15/2016 Document Reviewed: 08/15/2016 Elsevier Interactive Patient Education  2017 [employer].

## 2017-09-21 NOTE — Progress Notes (Addendum)
BP 122/76   Pulse 92   Temp 98.2 F (36.8 C) (Oral)   Resp 14   Wt 201 lb 14.4 oz (91.6 kg)   SpO2 96%   BMI 40.78 kg/m    Subjective:    Patient ID: [name], female    DOB: 11-20-1965, 52 y.o.   MRN: [id number]  HPI: [name] is a 52 y.o. female  Chief Complaint  Patient presents with  . Follow-up    HPI She is here for right knee pain; fell 3 times, 2x in one day and 1 the next day She fell in the hallway, one down the front steps, and another in the bathtub Not any better If still hurting, then may need xray It swelled up right away Hurts to get up stairs Hurts to bend it, worse when on it a lot Tried tylenol Her psychiatrist and heart doctor changed her medicine recently; mouth is really dry Heart doctor says she has not blockages; "perfect" heart Still seeing Dr. Ronnald Collum today, sugars are still high   Depression screen [facility] 2/9 09/21/2017 08/22/2017 08/16/2017 07/17/2017 06/05/2017  Decreased Interest 0 0 0 0 0  Down, Depressed, Hopeless 0 0 0 0 0  PHQ - 2 Score 0 0 0 0 0  Some recent data might be hidden    Relevant past medical, surgical, family and social history reviewed Past Medical History:  Diagnosis Date  . Asthma   . Bipolar depression ([place])   . CHF (congestive heart failure) ([place])   . Chronic headaches   . COPD (chronic obstructive pulmonary disease) ([place])   . Diabetic neuropathy ([place])   . GERD (gastroesophageal reflux disease)   . Headache   . Heart attack ([place])   . Hyperlipidemia   . Hypertension   . Hypomagnesemia   . Hypothyroid   . Irregular heart rhythm   . Liver lesion    Favored to be benign on CT; MRI of abd w/contract in Aug 2016. Liver Biopsy-Negative, March 2016  . Morbid obesity ([place])   . OSA (obstructive sleep apnea)   . Pinched nerve    Back  . Pneumonia   . RLS (restless legs syndrome)   . Seasonal allergies   . Sleep apnea   . Type 2 diabetes, uncontrolled, with neuropathy ([place]) 10/15/2015   Overview:   Microalbumin 7.7 12/2010: Hgb A1c 10.2% 12/2010 and 9.7% 03/2011, eye exam 10/2009 [facility]   . Urinary incontinence   . Vitamin D deficiency    Past Surgical History:  Procedure Laterality Date  . ABDOMINAL HYSTERECTOMY    . ABDOMINAL HYSTERECTOMY     Partial  . BLADDER SURGERY     x 2  . CHOLECYSTECTOMY    . LIVER BIOPSY  March 2016   Negative  . LUNG BIOPSY    . TUBAL LIGATION     Family History  Problem Relation Age of Onset  . Diabetes Mother   . Heart disease Mother   . Hyperlipidemia Mother   . Hypertension Mother   . Kidney disease Mother   . Mental illness Mother   . Hypothyroidism Mother   . Stroke Mother   . Osteoporosis Mother   . Glaucoma Mother   . Congestive Heart Failure Mother   . Hypertension Father   . Asthma Daughter   . Cancer Daughter        throat  . Bipolar disorder Daughter   . Heart disease Maternal Grandfather   . Rheum  arthritis Maternal Grandfather   . Cancer Maternal Grandfather        liver  . Kidney disease Maternal Grandfather   . Cancer Paternal Grandmother        lung  . Kidney disease Brother   . Bipolar disorder Daughter   . Hypertension Daughter   . Restless legs syndrome Daughter    Social History   Tobacco Use  . Smoking status: Never Smoker  . Smokeless tobacco: Never Used  Substance Use Topics  . Alcohol use: No  . Drug use: No    Interim medical history since last visit reviewed. Allergies and medications reviewed  Review of Systems Per HPI unless specifically indicated above     Objective:    BP 122/76   Pulse 92   Temp 98.2 F (36.8 C) (Oral)   Resp 14   Wt 201 lb 14.4 oz (91.6 kg)   SpO2 96%   BMI 40.78 kg/m   Wt Readings from Last 3 Encounters:  09/21/17 201 lb 14.4 oz (91.6 kg)  09/11/17 195 lb (88.5 kg)  09/10/17 197 lb 6.4 oz (89.5 kg)    Physical Exam  Constitutional: She appears well-developed and well-nourished.  HENT:  Mouth/Throat: Mucous membranes are normal.  Eyes: EOM are  normal. No scleral icterus.  Cardiovascular: Normal rate and regular rhythm.  Pulmonary/Chest: Effort normal and breath sounds normal.  Musculoskeletal:       Right knee: She exhibits decreased range of motion and effusion (mild). Tenderness found. Medial joint line, MCL and patellar tendon tenderness noted.  Psychiatric: She has a normal mood and affect. Her behavior is normal. Her mood appears not anxious. She does not exhibit a depressed mood.   Results for orders placed or performed in visit on 09/10/17  Urine Culture  Result Value Ref Range   MICRO NUMBER: [id number]    SPECIMEN QUALITY: ADEQUATE    Sample Source URINE, CLEAN CATCH    STATUS: FINAL    ISOLATE 1:      Multiple organisms present, each less than 10,000 CFU/mL. These organisms, commonly found on external and internal genitalia, are considered to be colonizers. No further testing performed.  TSH  Result Value Ref Range   TSH 0.94 mIU/L  CBC w/Diff/Platelet  Result Value Ref Range   WBC 8.9 3.8 - 10.8 Thousand/uL   RBC 5.03 3.80 - 5.10 Million/uL   Hemoglobin 14.0 11.7 - 15.5 g/dL   HCT 42.2 35.0 - 45.0 %   MCV 83.9 80.0 - 100.0 fL   MCH 27.8 27.0 - 33.0 pg   MCHC 33.2 32.0 - 36.0 g/dL   RDW 12.5 11.0 - 15.0 %   Platelets 301 140 - 400 Thousand/uL   MPV 9.7 7.5 - 12.5 fL   Neutro Abs 4,343 1,500 - 7,800 cells/uL   Lymphs Abs 3,729 850 - 3,900 cells/uL   WBC mixed population 579 200 - 950 cells/uL   Eosinophils Absolute 205 15 - 500 cells/uL   Basophils Absolute 45 0 - 200 cells/uL   Neutrophils Relative % 48.8 %   Total Lymphocyte 41.9 %   Monocytes Relative 6.5 %   Eosinophils Relative 2.3 %   Basophils Relative 0.5 %  Magnesium  Result Value Ref Range   Magnesium 2.0 1.5 - 2.5 mg/dL  ADD ON CMP  Result Value Ref Range   Glucose, Bld 202 (H) 65 - 99 mg/dL   BUN 32 (H) 7 - 25 mg/dL   Creat 1.38 (H) 0.50 - 1.05  mg/dL   GFR, Est Non African American 44 (L) > OR = 60 mL/min/1.73m   GFR, Est African  American 50 (L) > OR = 60 mL/min/1.723m  BUN/Creatinine Ratio 23 (H) 6 - 22 (calc)   Sodium 142 135 - 146 mmol/L   Potassium 4.5 3.5 - 5.3 mmol/L   Chloride 101 98 - 110 mmol/L   CO2 19 (L) 20 - 32 mmol/L   Calcium 10.4 8.6 - 10.4 mg/dL   Total Protein 7.0 6.1 - 8.1 g/dL   Albumin 4.5 3.6 - 5.1 g/dL   Globulin 2.5 1.9 - 3.7 g/dL (calc)   AG Ratio 1.8 1.0 - 2.5 (calc)   Total Bilirubin 0.2 0.2 - 1.2 mg/dL   Alkaline phosphatase (APISO) 98 33 - 130 U/L   AST 17 10 - 35 U/L   ALT 17 6 - 29 U/L  TEST AUTHORIZATION  Result Value Ref Range   TEST NAME: ADD ON COMPREHENSIVE    TEST CODE: [id number]  CLIENT CONTACT: CYNTHIA SIMPSON    REPORT ALWAYS MESSAGE SIGNATURE    POCT urinalysis dipstick  Result Value Ref Range   Color, UA yellow    Clarity, UA clear    Glucose, UA 1,000    Bilirubin, UA negative    Ketones, UA negative    Spec Grav, UA 1.020 1.010 - 1.025   Blood, UA negative    pH, UA 5.0 5.0 - 8.0   Protein, UA negative    Urobilinogen, UA negative (A) 0.2 or 1.0 E.U./dL   Nitrite, UA negative    Leukocytes, UA Negative Negative   Appearance clear    Odor strong       Assessment & Plan:   Problem List Items Addressed This Visit      Endocrine   Type 2 diabetes, uncontrolled, with neuropathy (HCC)   Relevant Medications   HUMULIN R U-500 KWIKPEN 500 UNIT/ML kwikpen   JARDIANCE 25 MG TABS tablet   insulin aspart (NOVOLOG) 100 UNIT/ML injection   Other Relevant Orders   Ambulatory referral to Ophthalmology    Other Visit Diagnoses    Right anterior knee pain    -  Primary   Relevant Orders   DG Knee Complete 4 Views Right       Follow up plan: No Follow-up on file.  An after-visit summary was printed [place] [facility] Please see the patient instructions which may contain other information and recommendations beyond what is mentioned above in the assessment and plan.  Meds ordered this encounter  Medications  . traMADol (ULTRAM)  50 MG tablet    Sig: Take 1 tablet (50 mg total) by mouth every 6 (six) hours as needed.    Dispense:  20 tablet    Refill:  0    Orders Placed This Encounter  Procedures  . DG Knee Complete 4 Views Right  . Ambulatory referral to Ophthalmology    Unable to access [facility] site; message sent to help center -------------------------------------------- Addendum: able to access [facility] with help of Shawn; reviewed hx; last fill was for oxycodone in November for 12 pills; will be very cognizant going forward; patient already instructed to not mix pills, and specifically no anxiety pills with this; there is not a single benzo Rx over the last two years; no concern from me for diversion or misuse

## 2017-09-21 NOTE — Telephone Encounter (Signed)
Pharmacy requesting refill for Gabapentin.  Per Dr. Amalia Hailey, ok to refill.   Script has been sent to pharmacy

## 2017-09-22 ENCOUNTER — Emergency Department
Admission: EM | Admit: 2017-09-22 | Discharge: 2017-09-22 | Disposition: A | Discharge status: Home / Self Care | Payer: Medicare HMO | Attending: Emergency Medicine | Admitting: Emergency Medicine

## 2017-09-22 ENCOUNTER — Emergency Department: Payer: Medicare HMO

## 2017-09-22 ENCOUNTER — Other Ambulatory Visit: Payer: Self-pay

## 2017-09-22 DIAGNOSIS — R42 Dizziness and giddiness: Secondary | ICD-10-CM | POA: Diagnosis not present

## 2017-09-22 DIAGNOSIS — R252 Cramp and spasm: Secondary | ICD-10-CM | POA: Diagnosis not present

## 2017-09-22 DIAGNOSIS — N183 Chronic kidney disease, stage 3 (moderate): Secondary | ICD-10-CM | POA: Insufficient documentation

## 2017-09-22 DIAGNOSIS — E039 Hypothyroidism, unspecified: Secondary | ICD-10-CM | POA: Insufficient documentation

## 2017-09-22 DIAGNOSIS — I130 Hypertensive heart and chronic kidney disease with heart failure and stage 1 through stage 4 chronic kidney disease, or unspecified chronic kidney disease: Secondary | ICD-10-CM | POA: Diagnosis not present

## 2017-09-22 DIAGNOSIS — J45909 Unspecified asthma, uncomplicated: Secondary | ICD-10-CM | POA: Diagnosis not present

## 2017-09-22 DIAGNOSIS — Z7982 Long term (current) use of aspirin: Secondary | ICD-10-CM | POA: Insufficient documentation

## 2017-09-22 DIAGNOSIS — I509 Heart failure, unspecified: Secondary | ICD-10-CM | POA: Diagnosis not present

## 2017-09-22 DIAGNOSIS — J449 Chronic obstructive pulmonary disease, unspecified: Secondary | ICD-10-CM | POA: Diagnosis not present

## 2017-09-22 DIAGNOSIS — Z79899 Other long term (current) drug therapy: Secondary | ICD-10-CM | POA: Insufficient documentation

## 2017-09-22 LAB — CBC
HCT: 42.9 % (ref 35.0–47.0)
Hemoglobin: 14.4 g/dL (ref 12.0–16.0)
MCH: 28.4 pg (ref 26.0–34.0)
MCHC: 33.6 g/dL (ref 32.0–36.0)
MCV: 84.7 fL (ref 80.0–100.0)
Platelets: 266 K/uL (ref 150–440)
RBC: 5.06 MIL/uL (ref 3.80–5.20)
RDW: 13.7 % (ref 11.5–14.5)
WBC: 8 K/uL (ref 3.6–11.0)

## 2017-09-22 LAB — BASIC METABOLIC PANEL WITH GFR
Anion gap: 13 (ref 5–15)
BUN: 17 mg/dL (ref 6–20)
CO2: 28 mmol/L (ref 22–32)
Calcium: 10.3 mg/dL (ref 8.9–10.3)
Chloride: 98 mmol/L — ABNORMAL LOW (ref 101–111)
Creatinine, Ser: 0.95 mg/dL (ref 0.44–1.00)
GFR calc Af Amer: >60 mL/min
GFR calc non Af Amer: >60 mL/min
Glucose, Bld: 137 mg/dL — ABNORMAL HIGH (ref 65–99)
Potassium: 3.9 mmol/L (ref 3.5–5.1)
Sodium: 139 mmol/L (ref 135–145)

## 2017-09-22 LAB — LIPASE, BLOOD: Lipase: 26 U/L (ref 11–51)

## 2017-09-22 LAB — TROPONIN I: Troponin I: <0.03 ng/mL (ref <0.03)

## 2017-09-22 MED ORDER — SODIUM CHLORIDE 0.9 % IV BOLUS (SEPSIS)
1000.0000 mL | Freq: Once | INTRAVENOUS | Status: AC
  Administered 2017-09-22: 1000 mL via INTRAVENOUS

## 2017-09-22 MED ORDER — ONDANSETRON 4 MG PO TBDP
4.0000 mg | ORAL_TABLET | Freq: Once | ORAL | Status: AC
  Administered 2017-09-22: 4 mg via ORAL
  Filled 2017-09-22: qty 1

## 2017-09-22 MED ORDER — ASPIRIN 81 MG PO CHEW
324.0000 mg | CHEWABLE_TABLET | Freq: Once | ORAL | Status: AC
Start: 2017-09-22 — End: 2017-09-22
  Administered 2017-09-22: 324 mg via ORAL
  Filled 2017-09-22: qty 4

## 2017-09-22 NOTE — ED Triage Notes (Signed)
Pt came to Ed via pov c/o chest pain starting today as well as n/v. C/o cramping in legs. Reports recent med changes (trazadone, new diabetic medication and increased metoprolol). Hr 103 in triage. BP 133/82

## 2017-09-22 NOTE — ED Provider Notes (Signed)
[facility] Emergency Department Provider Note  ____________________________________________   None    (approximate)  I have reviewed the triage vital signs and the nursing notes.   HISTORY  Chief Complaint Crampy leg pain and lightheadedness for 2-3 days  *[name] chief complaint at triage seems to have changed by this time I evaluated her.  On my evaluation she reports that she came to the hospital because of lightheadedness over the last 2-3 days it seems worse with standing.  She also reports that she has had some discomfort and achiness in her calf muscles off and on for several months.  HPI [name] is a 52 y.o. female past medical history of bipolar disorder congestive heart failure COPD diabetes headache and multiple other comorbidities.  Patient reports she feels lightheaded with standing for 2-3 days.  She reports she feels like she is just a little bit dehydrated.  No nausea or vomiting.  No fevers or chills.  No chest pain.  Reports that she has had some achiness in her calf muscles for several days to a month or 2.  Reports he saw Dr. for this recently.  Also saw cardiology about a week ago and they increased her metoprolol.   When asked about the chest pain the patient mentioned at triage, she reports that she has had discomfort in her mid chest when she takes a deep breath that is been ongoing for about 3 or more months and she is seeing cardiology and other physicians for it.  She denies that this is a new complaint.    Past Medical History:  Diagnosis Date  . Asthma   . Bipolar depression ([place])   . CHF (congestive heart failure) ([place])   . Chronic headaches   . COPD (chronic obstructive pulmonary disease) ([place])   . Diabetic neuropathy ([place])   . GERD (gastroesophageal reflux disease)   . Headache   . Heart attack ([place])   . Hyperlipidemia   . Hypertension   . Hypomagnesemia   . Hypothyroid   . Irregular heart rhythm   .  Liver lesion    Favored to be benign on CT; MRI of abd w/contract in Aug 2016. Liver Biopsy-Negative, March 2016  . Morbid obesity ([place])   . OSA (obstructive sleep apnea)   . Pinched nerve    Back  . Pneumonia   . RLS (restless legs syndrome)   . Seasonal allergies   . Sleep apnea   . Type 2 diabetes, uncontrolled, with neuropathy ([place]) 10/15/2015   Overview:  Microalbumin 7.7 12/2010: Hgb A1c 10.2% 12/2010 and 9.7% 03/2011, eye exam 10/2009 [facility] DM [facility]   . Urinary incontinence   . Vitamin D deficiency     Patient Active Problem List   Diagnosis Date Noted  . Chest pain 09/10/2017  . Lobar pneumonia ([place]) 06/05/2017  . Multifocal pneumonia 05/27/2017  . Pleuritic chest pain 05/27/2017  . Hypertriglyceridemia 12/21/2016  . Pansinusitis 11/15/2016  . 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 Breast cancer screening 09/28/2016  . Urine frequency 09/28/2016  . Preventative health care 09/28/2016  . Fatigue 05/01/2016  . Urinary tract infection 03/14/2016  . Adenomatous colon polyp 10/15/2015  . Airway hyperreactivity 10/15/2015  . Bipolar affective disorder ([place]) 10/15/2015  . CN (constipation) 10/15/2015  . CAFL (chronic airflow limitation) ([place]) 10/15/2015  . Drug-induced hepatic toxicity 10/15/2015  . Blood in feces 10/15/2015  . HLD (hyperlipidemia) 10/15/2015  . Hypoxemia 10/15/2015  .  NASH (nonalcoholic steatohepatitis) 10/15/2015  . Morbid obesity ([place]) 10/15/2015  . Restless leg syndrome 10/15/2015  . Type 2 diabetes, uncontrolled, with neuropathy ([place]) 10/15/2015  . Elevated alkaline phosphatase level 09/22/2015  . Urinary bladder incontinence 09/22/2015  . Low back pain with left-sided sciatica 08/18/2015  . Encounter for monitoring tricyclic antidepressant therapy 05/13/2015  . Bipolar disorder ([place]) 05/11/2015  . Liver disease 05/11/2015  . Apnea, sleep 05/05/2015  . Heart valve disease 05/05/2015  . Chronic kidney disease, stage III  (moderate) (HCC) 03/15/2015  . Migraine 03/12/2015  . COPD (chronic obstructive pulmonary disease) ([place]) 01/20/2015  . 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 GERD (gastroesophageal reflux disease) 01/20/2015  . Hypothyroid   . Vitamin D deficiency   . Urinary incontinence   . Liver lesion   . Hypomagnesemia   . Obstructive sleep apnea 10/20/2013  . PERIPHERAL NEUROPATHY 03/04/2009  . HEMORRHOIDS, EXTERNAL 09/03/2008  . ALLERGIC RHINITIS 07/04/2007  . ASTHMA, PERSISTENT, MODERATE 07/04/2007  . INTERSTITIAL CYSTITIS 07/04/2007  . HYPERTENSION, BENIGN ESSENTIAL 06/19/2007    Past Surgical History:  Procedure Laterality Date  . ABDOMINAL HYSTERECTOMY    . ABDOMINAL HYSTERECTOMY     Partial  . BLADDER SURGERY     x 2  . CHOLECYSTECTOMY    . LIVER BIOPSY  March 2016   Negative  . LUNG BIOPSY    . TUBAL LIGATION      Prior to Admission medications   Medication Sig Start Date End Date Taking? Authorizing Provider  albuterol (PROVENTIL HFA;VENTOLIN HFA) 108 (90 Base) MCG/ACT inhaler Inhale 2 puffs into the lungs every 4 (four) hours as needed for wheezing or shortness of breath.    [provider]  amLODipine (NORVASC) 2.5 MG tablet Take 2.5 mg by mouth daily.    [provider]  aspirin EC 81 MG tablet Take 1 tablet (81 mg total) by mouth daily. 11/03/16   Arnetha Courser, MD  atorvastatin (LIPITOR) 40 MG tablet Take 1 tablet (40 mg total) by mouth at bedtime. 04/13/17   Arnetha Courser, MD  budesonide-formoterol (SYMBICORT) 160-4.5 MCG/ACT inhaler Inhale 2 puffs into the lungs 2 (two) times daily.    [provider]  cholecalciferol (VITAMIN D) 1000 units tablet Take 1,000 Units by mouth daily.    [provider]  DULoxetine (CYMBALTA) 60 MG capsule Take 120 mg by mouth daily.    [provider]  ezetimibe (ZETIA) 10 MG tablet Take 10 mg by mouth daily.  01/05/17   [provider]  fluticasone (FLONASE) 50 MCG/ACT nasal  spray Place 1 spray into both nostrils daily.    [provider]  gabapentin (NEURONTIN) 300 MG capsule Take 1 capsule (300 mg total) by mouth 2 (two) times daily. 09/21/17   Edrick Kins, DPM  HUMULIN R U-500 KWIKPEN 500 UNIT/ML kwikpen Inject 60 Units as directed 3 (three) times daily. 09/18/17   [provider]  hydrOXYzine (VISTARIL) 50 MG capsule Take 50 mg by mouth 2 (two) times daily as needed.  06/14/17   [provider]  insulin aspart (NOVOLOG) 100 UNIT/ML injection Inject into the skin 3 (three) times daily before meals.    [provider]  JARDIANCE 25 MG TABS tablet Take 25 mg by mouth daily. 09/19/17   [provider]  lamoTRIgine (LAMICTAL) 150 MG tablet Take 100 mg by mouth daily.  03/22/15   [provider]  lamoTRIgine (LAMICTAL) 200 MG tablet Take 200 mg daily by mouth.  06/14/17   [provider]  lamoTRIgine (LAMICTAL) 25 MG tablet Take 25 mg by mouth 2 (two) times daily.    [provider]  levothyroxine (SYNTHROID, LEVOTHROID) 75 MCG tablet Take 1 tablet (75 mcg total) by mouth daily. 10/19/16   Arnetha Courser, MD  linaclotide (LINZESS) 72 MCG capsule Take 1 capsule (72 mcg total) by mouth daily before breakfast. Patient not taking: Reported on 09/21/2017 08/21/16   [name], MD  magnesium oxide (MAG-OX) 400 MG tablet Take 400 mg by mouth daily.    [provider]  metoprolol tartrate (LOPRESSOR) 25 MG tablet Take 1 tablet (25 mg total) by mouth 2 (two) times daily. 09/11/17   Minna Merritts, MD  pantoprazole (PROTONIX) 40 MG tablet Take 1 tablet (40 mg total) by mouth daily. Patient not taking: Reported on 09/21/2017 08/21/16   [name], MD  QUEtiapine (SEROQUEL) 300 MG tablet Take 300 mg by mouth 2 (two) times daily.    [provider]  traMADol (ULTRAM) 50 MG tablet Take 1 tablet (50 mg total) by mouth every 6 (six) hours as needed. 09/21/17   Arnetha Courser, MD  traZODone  (DESYREL) 50 MG tablet Take 50 mg by mouth 2 (two) times daily. 08/24/17   [provider]    Allergies Morphine and related; Codeine; Codeine; Hydrocodone-acetaminophen; Mirtazapine; Morphine and related; Sulfamethoxazole-trimethoprim; and Vicodin [hydrocodone-acetaminophen]  Family History  Problem Relation Age of Onset  . Diabetes Mother   . Heart disease Mother   . Hyperlipidemia Mother   . Hypertension Mother   . Kidney disease Mother   . Mental illness Mother   . Hypothyroidism Mother   . Stroke Mother   . Osteoporosis Mother   . Glaucoma Mother   . Congestive Heart Failure Mother   . Hypertension Father   . Asthma Daughter   . Cancer Daughter        throat  . Bipolar disorder Daughter   . Heart disease Maternal Grandfather   . Rheum arthritis Maternal Grandfather   . Cancer Maternal Grandfather        liver  . Kidney disease Maternal Grandfather   . Cancer Paternal Grandmother        lung  . Kidney disease Brother   . Bipolar disorder Daughter   . Hypertension Daughter   . Restless legs syndrome Daughter     Social History Social History   Tobacco Use  . Smoking status: Never Smoker  . Smokeless tobacco: Never Used  Substance Use Topics  . Alcohol use: No  . Drug use: No    Review of Systems Constitutional: No fever/chills Eyes: No visual changes. ENT: No sore throat. Cardiovascular: Denies chest pain except with deep inspiration, ongoing for about 3 or more months. Respiratory: Denies shortness of breath. Gastrointestinal: No abdominal pain.  No nausea, no vomiting.  No diarrhea.  No constipation. Genitourinary: Negative for dysuria. Musculoskeletal: Negative for back pain.  Some crampiness in her calf muscles off and on for a few months. Skin: Negative for rash. Neurological: Negative for headaches, focal weakness or numbness.    ____________________________________________   PHYSICAL EXAM:  VITAL SIGNS: ED Triage Vitals  Enc  Vitals Group     BP 09/22/17 1355 133/82     Pulse Rate 09/22/17 1355 (!) 107     Resp 09/22/17 1355 16     Temp 09/22/17 1355 98.4 F (36.9 C)     Temp Source 09/22/17 1355 Oral  SpO2 09/22/17 1355 94 %     Weight 09/22/17 1356 201 lb (91.2 kg)     Height 09/22/17 1356 4' 11"  (1.499 m)     Head Circumference --      Peak Flow --      Pain Score --      Pain Loc --      Pain Edu? --      Excl. in [place]? --     Constitutional: Alert and oriented. Well appearing and in no acute distress. Eyes: Conjunctivae are normal. Head: Atraumatic. Nose: No congestion/rhinnorhea. Mouth/Throat: Mucous membranes are moist. Neck: No stridor.   Cardiovascular: Very minimally tachycardic rate, regular rhythm. Grossly normal heart sounds.  Good peripheral circulation. Respiratory: Normal respiratory effort.  No retractions. Lungs CTAB. Gastrointestinal: Soft and nontender. No distention. Musculoskeletal: No lower extremity tenderness nor edema. Neurologic:  Normal speech and language. No gross focal neurologic deficits are appreciated.  Skin:  Skin is warm, dry and intact. No rash noted. Psychiatric: Mood and affect are normal. Speech and behavior are normal.  ____________________________________________   LABS (all labs ordered are listed, but only abnormal results are displayed)  Labs Reviewed  BASIC METABOLIC PANEL - Abnormal; Notable for the following components:      Result Value   Chloride 98 (*)    Glucose, Bld 137 (*)    All other components within normal limits  CBC  TROPONIN I  LIPASE, BLOOD   ____________________________________________  EKG  EKG is reviewed interrupt by me at 1400 Heart rate 105 QRS 80 QTC 440 Sinus tachycardia, minimal T wave abnormality noted in lead I and aVL.  Appears similar in nature to previous EKG from June 26, 2017, however mild inversion in aVL and a slight biphasic appearance versus mild U wave are also detected in lead I.  Minimal change  in the T waves in the lateral leads as compared with previous, seems very minimal and very non-specific. May be positioning.  ____________________________________________  RADIOLOGY  Dg Chest 2 View  Result Date: 09/22/2017 CLINICAL DATA:  Vomiting, shortness of breath EXAM: CHEST  2 VIEW COMPARISON:  06/26/2017 FINDINGS: The heart size and mediastinal contours are within normal limits. Both lungs are clear. The visualized skeletal structures are unremarkable except for diffuse thoracic spondylosis. Remote cholecystectomy. Trachea is midline. IMPRESSION: No active cardiopulmonary disease. Electronically Signed   By: Jerilynn Mages.  Shick M.D.   On: 09/22/2017 14:57    ____________________________________________   PROCEDURES  Procedure(s) performed: None  Procedures  Critical Care performed: No  ____________________________________________   INITIAL IMPRESSION / ASSESSMENT AND PLAN / ED COURSE  Pertinent labs & imaging results that were available during my care of the patient were reviewed by me and considered in my medical decision making (see chart for details).  Patient presents for evaluation of feeling lightheaded with ambulation for 2-3 days, this is in the setting of recently and having added a beta-blocker by cardiology.  She denies any other associated symptoms except for some slight cramping in her thighs    Regards the patient's chest pain, it seems very atypical and extremely chronic she reports is been ongoing issue with a slight pleuritic component for at least 3-4 months.  She has had a previous CT and excluded pulmonary embolism, d-dimer etc. according to Dr. Donivan Scull note.  Dr. Rockey Situ also did not feel that there is a strong indication that her chest discomfort is related to an acute coronary disease either.  ----------------------------------------- 7:18 PM on 09/22/2017 -----------------------------------------  Patient resting comfortably, reports that she would like to  be able to eat something and go home.  Her workup is now complete, chest x-ray normal, labs including troponin are normal.  Her symptoms are related to chest discomfort been ongoing for 3-4 months and stable, extremely atypical and she has had pulmonary embolism ruled out, previous CT, seen cardiology etc.  I do not believe that additional testing is needed regarding the symptoms as he seemed very chronic and have been followed up closely outpatient without significant change.  With regard to her lightheadedness, could possibly be related to being started recently on a beta-blocker.  Her orthostatics are reassuring.  She is hungry, denying any nausea or pain.  She reported to the nurse that she had vomited 5 times a day, but when I spoke to her she denies this.  [name] history is hard to understand, she seems to bounce between multiple symptomatologies and there is seems to be a slight lack in consistency.  Abdominal exam is very reassuring, no pain.  Reports she is hungry with no pain or discomfort at this time.  First medical evaluation here is thus far unrevealing, she is awake alert seems appropriate for ongoing outpatient follow-up.  Return precautions and treatment recommendations and follow-up discussed with the patient who is agreeable with the plan.  ____________________________________________   FINAL CLINICAL IMPRESSION(S) / ED DIAGNOSES  Final diagnoses:  Lightheadedness  Leg cramps      NEW MEDICATIONS STARTED DURING THIS VISIT:  New Prescriptions   No medications on file     Note:  This document was prepared using Dragon voice recognition software and may include unintentional dictation errors.     Delman Kitten, MD 09/22/17 1921

## 2017-09-22 NOTE — ED Notes (Signed)
Pt states that she feels achy all over, and that she feels like she does when she is dehydrated. PT states that she has been vomiting. Vomiting started this morning, pt unable to keep down any fluids. Pt reports that she has vomited 5 times this morning. Pt reports abd pain and chest pain when she takes a deep breath. Pt denies fever but states that she has had chills that started last night. Pt states that her daughter and grandchildren had similar symptoms recently.  Pt in NAD at this time, able to speak in complete sentences, color WNL.

## 2017-09-22 NOTE — ED Notes (Signed)
Patient O2 sat dropped to 88-89% on room air. RN in room to check on patient, patient was sleeping. Pt states that she wears 2 liters O2 at night when sleeping. Pt placed on 2 liters O2 via [place] with sats improving to 95%

## 2017-09-28 ENCOUNTER — Encounter: Payer: Self-pay | Admitting: Family Medicine

## 2017-09-28 DIAGNOSIS — F250 Schizoaffective disorder, bipolar type: Secondary | ICD-10-CM | POA: Insufficient documentation

## 2017-10-01 ENCOUNTER — Encounter: Payer: Self-pay | Admitting: Family Medicine

## 2017-10-09 ENCOUNTER — Ambulatory Visit
Admission: RE | Admit: 2017-10-09 | Discharge: 2017-10-09 | Disposition: A | Discharge status: Home / Self Care | Payer: Medicare HMO | Source: Ambulatory Visit | Attending: Family Medicine | Admitting: Family Medicine

## 2017-10-09 ENCOUNTER — Ambulatory Visit (INDEPENDENT_AMBULATORY_CARE_PROVIDER_SITE_OTHER): Payer: Medicare HMO | Admitting: Family Medicine

## 2017-10-09 ENCOUNTER — Encounter: Payer: Self-pay | Admitting: Family Medicine

## 2017-10-09 VITALS — BP 100/70 | HR 94 | Temp 98.3°F | Resp 16 | Wt 194.2 lb

## 2017-10-09 DIAGNOSIS — I25118 Atherosclerotic heart disease of native coronary artery with other forms of angina pectoris: Secondary | ICD-10-CM | POA: Insufficient documentation

## 2017-10-09 DIAGNOSIS — R05 Cough: Secondary | ICD-10-CM | POA: Diagnosis not present

## 2017-10-09 DIAGNOSIS — M7550 Bursitis of unspecified shoulder: Secondary | ICD-10-CM

## 2017-10-09 DIAGNOSIS — R6889 Other general symptoms and signs: Secondary | ICD-10-CM | POA: Diagnosis not present

## 2017-10-09 DIAGNOSIS — E1165 Type 2 diabetes mellitus with hyperglycemia: Secondary | ICD-10-CM | POA: Diagnosis not present

## 2017-10-09 DIAGNOSIS — E1140 Type 2 diabetes mellitus with diabetic neuropathy, unspecified: Secondary | ICD-10-CM | POA: Diagnosis not present

## 2017-10-09 DIAGNOSIS — M778 Other enthesopathies, not elsewhere classified: Secondary | ICD-10-CM | POA: Insufficient documentation

## 2017-10-09 DIAGNOSIS — R059 Cough, unspecified: Secondary | ICD-10-CM

## 2017-10-09 DIAGNOSIS — IMO0002 Reserved for concepts with insufficient information to code with codable children: Secondary | ICD-10-CM

## 2017-10-09 DIAGNOSIS — M722 Plantar fascial fibromatosis: Secondary | ICD-10-CM | POA: Insufficient documentation

## 2017-10-09 DIAGNOSIS — J101 Influenza due to other identified influenza virus with other respiratory manifestations: Secondary | ICD-10-CM

## 2017-10-09 DIAGNOSIS — I2510 Atherosclerotic heart disease of native coronary artery without angina pectoris: Secondary | ICD-10-CM | POA: Insufficient documentation

## 2017-10-09 LAB — GLUCOSE, POCT (MANUAL RESULT ENTRY): POC GLUCOSE: 201 mg/dL — AB (ref 70–99)

## 2017-10-09 LAB — POCT INFLUENZA A/B
INFLUENZA B, POC: NEGATIVE
Influenza A, POC: POSITIVE — AB

## 2017-10-09 MED ORDER — OSELTAMIVIR PHOSPHATE 75 MG PO CAPS: 75.0000 mg | ORAL_CAPSULE | Freq: Two times a day (BID) | ORAL | 0 refills | Status: DC

## 2017-10-09 NOTE — Patient Instructions (Addendum)
Start the Tamiflu, and take one pill every 12 hours until you finish all ten pills If you get worse or think you are developing pneumonia, go to urgent care or the emergency department depending on how badly you feel Try vitamin C (orange juice if not diabetic or vitamin C tablets) and drink green tea to help your immune system during your illness Get plenty of rest and hydration After you leave here, have the chest xray done across the street  Influenza, Adult Influenza ("the flu") is an infection in the lungs, nose, and throat (respiratory tract). It is caused by a virus. The flu causes many common cold symptoms, as well as a high fever and body aches. It can make you feel very sick. The flu spreads easily from person to person (is contagious). Getting a flu shot (influenza vaccination) every year is the best way to prevent the flu. Follow these instructions at home:  Take over-the-counter and prescription medicines only as told by your doctor.  Use a cool mist humidifier to add moisture (humidity) to the air in your home. This can make it easier to breathe.  Rest as needed.  Drink enough fluid to keep your pee (urine) clear or pale yellow.  Cover your mouth and nose when you cough or sneeze.  Wash your hands with soap and water often, especially after you cough or sneeze. If you cannot use soap and water, use hand sanitizer.  Stay home from work or school as told by your doctor. Unless you are visiting your doctor, try to avoid leaving home until your fever has been gone for 24 hours without the use of medicine.  Keep all follow-up visits as told by your doctor. This is important. How is this prevented?  Getting a yearly (annual) flu shot is the best way to avoid getting the flu. You may get the flu shot in late summer, fall, or winter. Ask your doctor when you should get your flu shot.  Wash your hands often or use hand sanitizer often.  Avoid contact with people who are sick  during cold and flu season.  Eat healthy foods.  Drink plenty of fluids.  Get enough sleep.  Exercise regularly. Contact a doctor if:  You get new symptoms.  You have: ? Chest pain. ? Watery poop (diarrhea). ? A fever.  Your cough gets worse.  You start to have more mucus.  You feel sick to your stomach (nauseous).  You throw up (vomit). Get help right away if:  You start to be short of breath or have trouble breathing.  Your skin or nails turn a bluish color.  You have very bad pain or stiffness in your neck.  You get a sudden headache.  You get sudden pain in your face or ear.  You cannot stop throwing up. This information is not intended to replace advice given to you by your health care provider. Make sure you discuss any questions you have with your health care provider. Document Released: 05/30/2008 Document Revised: 01/27/2016 Document Reviewed: 06/15/2015 Elsevier Interactive Patient Education  2017 [employer].

## 2017-10-09 NOTE — Assessment & Plan Note (Addendum)
Check urine microalbumin:Cr; foot exam by MD; managed primarily by Dr. Ronnald Collum

## 2017-10-09 NOTE — Progress Notes (Signed)
BP 100/70 (BP Location: Left Arm, Patient Position: Sitting, Cuff Size: Normal)   Pulse 94   Temp 98.3 F (36.8 C) (Oral)   Resp 16   Wt 194 lb 3.2 oz (88.1 kg)   SpO2 94%   BMI 39.22 kg/m    Subjective:    Patient ID: [name], female    DOB: 1966-01-24, 52 y.o.   MRN: [id number]  HPI: [name] is a 52 y.o. female  Chief Complaint  Patient presents with  . Sore Throat     pain when coughing in chest and back. Running nose, earache, and dizzines.     HPI Patient is here for a sick visit She has been sick for two days; other people in her household have been sick Cold chills; coughing; ears are full; aching all over; runny nose; no sneezing Cough is nonproductive; thought she heard some wheezing last night Using inhalers; lungs feel tight Hx of pneumonia  She has type 2 diabetes and is overdue for urine microalbumin:Cr per our records FSBS 201 this morning; sugars have been much better controlled recently, 95-150s before getting sick Sugars have gone up since sick  Depression screen [facility] 2/9 10/09/2017 09/21/2017 08/22/2017 08/16/2017 07/17/2017  Decreased Interest 0 0 0 0 0  Down, Depressed, Hopeless 0 0 0 0 0  PHQ - 2 Score 0 0 0 0 0  Some recent data might be hidden    Relevant past medical, surgical, family and social history reviewed Past Medical History:  Diagnosis Date  . Asthma   . Bipolar depression ([place])   . CHF (congestive heart failure) ([place])   . Chronic headaches   . COPD (chronic obstructive pulmonary disease) ([place])   . Diabetic neuropathy ([place])   . GERD (gastroesophageal reflux disease)   . Headache   . Heart attack ([place])   . Hyperlipidemia   . Hypertension   . Hypomagnesemia   . Hypothyroid   . Irregular heart rhythm   . Liver lesion    Favored to be benign on CT; MRI of abd w/contract in Aug 2016. Liver Biopsy-Negative, March 2016  . Morbid obesity ([place])   . OSA (obstructive sleep apnea)   . Pinched nerve    Back  . Pneumonia     . RLS (restless legs syndrome)   . Seasonal allergies   . Sleep apnea   . Type 2 diabetes, uncontrolled, with neuropathy ([place]) 10/15/2015   Overview:  Microalbumin 7.7 12/2010: Hgb A1c 10.2% 12/2010 and 9.7% 03/2011, eye exam 10/2009 [facility]   . Urinary incontinence   . Vitamin D deficiency    Past Surgical History:  Procedure Laterality Date  . ABDOMINAL HYSTERECTOMY    . ABDOMINAL HYSTERECTOMY     Partial  . BLADDER SURGERY     x 2  . CHOLECYSTECTOMY    . LIVER BIOPSY  March 2016   Negative  . LUNG BIOPSY    . TUBAL LIGATION     Family History  Problem Relation Age of Onset  . Diabetes Mother   . Heart disease Mother   . Hyperlipidemia Mother   . Hypertension Mother   . Kidney disease Mother   . Mental illness Mother   . Hypothyroidism Mother   . Stroke Mother   . Osteoporosis Mother   . Glaucoma Mother   . Congestive Heart Failure Mother   . Hypertension Father   . Asthma Daughter   . Cancer Daughter  throat  . Bipolar disorder Daughter   . Heart disease Maternal Grandfather   . Rheum arthritis Maternal Grandfather   . Cancer Maternal Grandfather        liver  . Kidney disease Maternal Grandfather   . Cancer Paternal Grandmother        lung  . Kidney disease Brother   . Bipolar disorder Daughter   . Hypertension Daughter   . Restless legs syndrome Daughter    Social History   Tobacco Use  . Smoking status: Never Smoker  . Smokeless tobacco: Never Used  Substance Use Topics  . Alcohol use: No  . Drug use: No    Interim medical history since last visit reviewed. Allergies and medications reviewed  Review of Systems Per HPI unless specifically indicated above     Objective:    BP 100/70 (BP Location: Left Arm, Patient Position: Sitting, Cuff Size: Normal)   Pulse 94   Temp 98.3 F (36.8 C) (Oral)   Resp 16   Wt 194 lb 3.2 oz (88.1 kg)   SpO2 94%   BMI 39.22 kg/m   Wt Readings from Last 3 Encounters:  10/09/17 194 lb 3.2 oz (88.1  kg)  09/22/17 201 lb (91.2 kg)  09/21/17 201 lb 14.4 oz (91.6 kg)    Physical Exam  Constitutional: She appears well-developed and well-nourished. No distress (but appears to not feel well).  HENT:  Right Ear: Tympanic membrane and ear canal normal. No middle ear effusion.  Left Ear: Tympanic membrane and ear canal normal.  Nose: Rhinorrhea present.  Mouth/Throat: Oropharynx is clear and moist and mucous membranes are normal.  Eyes: EOM are normal. No scleral icterus.  Cardiovascular: Normal rate and regular rhythm.  Pulmonary/Chest: Effort normal and breath sounds normal. No respiratory distress. She has no decreased breath sounds. She has no wheezes. She has no rhonchi.  Skin: She is not diaphoretic. No pallor.  Psychiatric: She has a normal mood and affect. Her behavior is normal.   Results for orders placed or performed in visit on 10/09/17  POCT Influenza A/B  Result Value Ref Range   Influenza A, POC Positive (A) Negative   Influenza B, POC Negative Negative  POCT glucose (manual entry)  Result Value Ref Range   POC Glucose 201 (A) 70 - 99 mg/dl      Assessment & Plan:   Problem List Items Addressed This Visit      Endocrine   Type 2 diabetes, uncontrolled, with neuropathy ([place])    Check urine microalbumin:Cr; foot exam by MD; managed primarily by Dr. Ronnald Collum      Relevant Orders   Microalbumin / creatinine urine ratio   POCT glucose (manual entry) (Completed)    Other Visit Diagnoses    Influenza A    -  Primary   explained dx; risk of secondary pneumonia; sending for CXR; start Tamiflu; to UC or ER if worse   Relevant Medications   oseltamivir (TAMIFLU) 75 MG capsule   Other Relevant Orders   DG Chest 2 View   Flu-like symptoms       flu testing done; positive for A; start Tamiflu; explained she is very contagious, avoid public places   Relevant Orders   POCT Influenza A/B (Completed)   Cough       will get CXR given her symptoms in the setting of flu A    Relevant Orders   DG Chest 2 View       Follow up plan:  No Follow-up on file.  An after-visit summary was printed and given to the patient at [place].  Please see the patient instructions which may contain other information and recommendations beyond what is mentioned above in the assessment and plan.  Meds ordered this encounter  Medications  . oseltamivir (TAMIFLU) 75 MG capsule    Sig: Take 1 capsule (75 mg total) by mouth 2 (two) times daily.    Dispense:  10 capsule    Refill:  0    Orders Placed This Encounter  Procedures  . DG Chest 2 View  . Microalbumin / creatinine urine ratio  . POCT Influenza A/B  . POCT glucose (manual entry)

## 2017-10-10 LAB — MICROALBUMIN / CREATININE URINE RATIO
CREATININE, URINE: 72 mg/dL (ref 20–275)
Microalb Creat Ratio: 60 mcg/mg creat — ABNORMAL HIGH (ref <30)
Microalb, Ur: 4.3 mg/dL

## 2017-10-16 ENCOUNTER — Encounter: Payer: Self-pay | Admitting: Family Medicine

## 2017-10-16 ENCOUNTER — Ambulatory Visit (INDEPENDENT_AMBULATORY_CARE_PROVIDER_SITE_OTHER): Payer: Medicare HMO | Admitting: Family Medicine

## 2017-10-16 VITALS — BP 124/70 | HR 86 | Temp 98.4°F | Resp 16 | Ht 59.0 in | Wt 192.3 lb

## 2017-10-16 DIAGNOSIS — E1165 Type 2 diabetes mellitus with hyperglycemia: Secondary | ICD-10-CM | POA: Diagnosis not present

## 2017-10-16 DIAGNOSIS — B373 Candidiasis of vulva and vagina: Secondary | ICD-10-CM

## 2017-10-16 DIAGNOSIS — B3731 Acute candidiasis of vulva and vagina: Secondary | ICD-10-CM

## 2017-10-16 DIAGNOSIS — J0141 Acute recurrent pansinusitis: Secondary | ICD-10-CM | POA: Diagnosis not present

## 2017-10-16 DIAGNOSIS — E1140 Type 2 diabetes mellitus with diabetic neuropathy, unspecified: Secondary | ICD-10-CM | POA: Diagnosis not present

## 2017-10-16 DIAGNOSIS — B372 Candidiasis of skin and nail: Secondary | ICD-10-CM

## 2017-10-16 DIAGNOSIS — IMO0002 Reserved for concepts with insufficient information to code with codable children: Secondary | ICD-10-CM

## 2017-10-16 MED ORDER — LORATADINE 10 MG PO TABS: 10.0000 mg | ORAL_TABLET | Freq: Every day | ORAL | 0 refills | Status: DC

## 2017-10-16 MED ORDER — DOXYCYCLINE HYCLATE 100 MG PO TABS: 100.0000 mg | ORAL_TABLET | Freq: Two times a day (BID) | ORAL | 0 refills | Status: AC

## 2017-10-16 MED ORDER — FLUCONAZOLE 150 MG PO TABS: 150.0000 mg | ORAL_TABLET | Freq: Once | ORAL | 1 refills | Status: AC

## 2017-10-16 NOTE — Progress Notes (Signed)
Name: [name]   MRN: [id number]    DOB: December 29, 1965   Date:10/16/2017       Progress Note  Subjective  Chief Complaint  Chief Complaint  Patient presents with  . Sinus Problem    HPI  Pt presents with concern for possible sinus infection - she was diagnosed with influenza A on 10/09/17, finished tamiflu 2-3 days ago, was feeling a little better, but started to feel worse over the last couple of days.  She notes significant nasal congestion, drainage in her throat, sinus pain, LEFT ear and jaw pain, intermittent cough, rhinorrhea, mild nausea (chronic). Denies headaches, vision changes, fevers/chills, chest pain, shortness of breath, vomiting/diarrhea.  She has history recurrent sinusitis, she was referred to Dr. Virgia Land with [facility], but had to cancel because she had the flu - she does plan to reschedule ASAP.  DM: She has a history of very poorly controlled DM.  She has been working hard to control this.  She notes that her BG's have been tightly controlled for the last 1-2 weeks with BG's ranging from 70-120 - when 70 she did feel a little shaky (ate something and felt better afterwards).  I advised that 70 is a too low, and she needs to monitor closely while sick - she says she will do this and that she can typically tell when her BG is dropping.  Discussed sick day management including frequent BG checks; handout is provided.  Candidiasis: She notes she almost always gets a vaginal yeast infection when placed on antibiotics and requests diflucan to be Rx'd for this.  She also notes rash under bilateral breasts and in the bilateral groin area - states rash is not itchy or painful, just present without bleeding or drainage.   Patient Active Problem List   Diagnosis Date Noted  . Disorder of bursae of shoulder region 10/09/2017  . Coronary artery disease 10/09/2017  . Plantar fascial fibromatosis 10/09/2017  . Tendinitis of wrist 10/09/2017  . Schizoaffective disorder, bipolar type  ([place]) 09/28/2017  . Chest pain 09/10/2017  . Lobar pneumonia ([place]) 06/05/2017  . Multifocal pneumonia 05/27/2017  . Pleuritic chest pain 05/27/2017  . Hypertriglyceridemia 12/21/2016  . Pansinusitis 11/15/2016  . 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 Breast cancer screening 09/28/2016  . Urine frequency 09/28/2016  . Preventative health care 09/28/2016  . Fatigue 05/01/2016  . Urinary tract infection 03/14/2016  . Adenomatous colon polyp 10/15/2015  . Airway hyperreactivity 10/15/2015  . Bipolar affective disorder ([place]) 10/15/2015  . CN (constipation) 10/15/2015  . CAFL (chronic airflow limitation) ([place]) 10/15/2015  . Drug-induced hepatic toxicity 10/15/2015  . Blood in feces 10/15/2015  . HLD (hyperlipidemia) 10/15/2015  . Hypoxemia 10/15/2015  . NASH (nonalcoholic steatohepatitis) 10/15/2015  . Morbid obesity ([place]) 10/15/2015  . Restless leg syndrome 10/15/2015  . Type 2 diabetes, uncontrolled, with neuropathy ([place]) 10/15/2015  . Elevated alkaline phosphatase level 09/22/2015  . Urinary bladder incontinence 09/22/2015  . Low back pain with left-sided sciatica 08/18/2015  . Encounter for monitoring tricyclic antidepressant therapy 05/13/2015  . Bipolar disorder ([place]) 05/11/2015  . Liver disease 05/11/2015  . Apnea, sleep 05/05/2015  . Heart valve disease 05/05/2015  . Chronic kidney disease, stage III (moderate) (H[facility]) 03/15/2015  . Migraine 03/12/2015  . COPD (chronic obstructive pulmonary disease) ([place]) 01/20/2015  . 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 GERD (gastroesophageal reflux disease) 01/20/2015  . Hypothyroid   . Vitamin D deficiency   .  Urinary incontinence   . Liver lesion   . Hypomagnesemia   . Obstructive sleep apnea 10/20/2013  . Vaginitis 12/19/2011  . PERIPHERAL NEUROPATHY 03/04/2009  . HEMORRHOIDS, EXTERNAL 09/03/2008  . ALLERGIC RHINITIS 07/04/2007  . ASTHMA, PERSISTENT, MODERATE 07/04/2007  . INTERSTITIAL CYSTITIS  07/04/2007  . HYPERTENSION, BENIGN ESSENTIAL 06/19/2007    Social History   Tobacco Use  . Smoking status: Never Smoker  . Smokeless tobacco: Never Used  Substance Use Topics  . Alcohol use: No     Current Outpatient Medications:  .  albuterol (PROVENTIL HFA;VENTOLIN HFA) 108 (90 Base) MCG/ACT inhaler, Inhale 2 puffs into the lungs every 4 (four) hours as needed for wheezing or shortness of breath., Disp: , Rfl:  .  amLODipine (NORVASC) 2.5 MG tablet, Take 2.5 mg by mouth daily., Disp: , Rfl:  .  aspirin EC 81 MG tablet, Take 1 tablet (81 mg total) by mouth daily., Disp: , Rfl:  .  atorvastatin (LIPITOR) 40 MG tablet, Take 1 tablet (40 mg total) by mouth at bedtime., Disp: 90 tablet, Rfl: 1 .  budesonide-formoterol (SYMBICORT) 160-4.5 MCG/ACT inhaler, Inhale 2 puffs into the lungs 2 (two) times daily., Disp: , Rfl:  .  cholecalciferol (VITAMIN D) 1000 units tablet, Take 1,000 Units by mouth daily., Disp: , Rfl:  .  DULoxetine (CYMBALTA) 60 MG capsule, Take 120 mg by mouth daily., Disp: , Rfl:  .  ezetimibe (ZETIA) 10 MG tablet, Take 10 mg by mouth daily. , Disp: , Rfl:  .  fenofibrate micronized (LOFIBRA) 134 MG capsule, , Disp: , Rfl:  .  fluticasone (FLONASE) 50 MCG/ACT nasal spray, Place 1 spray into both nostrils daily., Disp: , Rfl:  .  gabapentin (NEURONTIN) 300 MG capsule, Take 1 capsule (300 mg total) by mouth 2 (two) times daily., Disp: 60 capsule, Rfl: 5 .  HUMULIN R U-500 KWIKPEN 500 UNIT/ML kwikpen, Inject 60 Units as directed 3 (three) times daily., Disp: , Rfl:  .  hydrOXYzine (VISTARIL) 50 MG capsule, Take 50 mg by mouth 2 (two) times daily as needed. , Disp: , Rfl:  .  insulin aspart (NOVOLOG) 100 UNIT/ML injection, Inject into the skin 3 (three) times daily before meals., Disp: , Rfl:  .  lamoTRIgine (LAMICTAL) 150 MG tablet, Take 100 mg by mouth daily. , Disp: , Rfl:  .  lamoTRIgine (LAMICTAL) 200 MG tablet, Take 200 mg daily by mouth., Disp: , Rfl:  .  lamoTRIgine  (LAMICTAL) 25 MG tablet, Take 25 mg by mouth 2 (two) times daily., Disp: , Rfl:  .  levothyroxine (SYNTHROID, LEVOTHROID) 75 MCG tablet, Take 1 tablet (75 mcg total) by mouth daily., Disp: 30 tablet, Rfl: 11 .  magnesium oxide (MAG-OX) 400 MG tablet, Take 400 mg by mouth daily., Disp: , Rfl:  .  metoprolol tartrate (LOPRESSOR) 25 MG tablet, Take 1 tablet (25 mg total) by mouth 2 (two) times daily., Disp: 60 tablet, Rfl: 6 .  oseltamivir (TAMIFLU) 75 MG capsule, Take 1 capsule (75 mg total) by mouth 2 (two) times daily., Disp: 10 capsule, Rfl: 0 .  pantoprazole (PROTONIX) 40 MG tablet, Take 1 tablet (40 mg total) by mouth daily., Disp: 90 tablet, Rfl: 3 .  QUEtiapine (SEROQUEL) 300 MG tablet, Take 300 mg by mouth 2 (two) times daily., Disp: , Rfl:  .  traMADol (ULTRAM) 50 MG tablet, Take 1 tablet (50 mg total) by mouth every 6 (six) hours as needed., Disp: 20 tablet, Rfl: 0 .  traZODone (DESYREL) 50 MG  tablet, Take 50 mg by mouth 2 (two) times daily., Disp: , Rfl:  .  JARDIANCE 25 MG TABS tablet, Take 25 mg by mouth daily., Disp: , Rfl:  .  linaclotide (LINZESS) 72 MCG capsule, Take 1 capsule (72 mcg total) by mouth daily before breakfast. (Patient not taking: Reported on 10/16/2017), Disp: 90 capsule, Rfl: 3  Current Facility-Administered Medications:  .  betamethasone acetate-betamethasone sodium phosphate (CELESTONE) injection 3 mg, 3 mg, Intramuscular, Once, Evans, Brent M, DPM .  betamethasone acetate-betamethasone sodium phosphate (CELESTONE) injection 3 mg, 3 mg, Intramuscular, Once, Edrick Kins, DPM  Allergies  Allergen Reactions  . Morphine And Related     anaphylaxis  . Codeine     REACTION: itch, rash  . Codeine     itching  . Hydrocodone-Acetaminophen     REACTION: rash  . Mirtazapine     REACTION: closes throat  . Morphine And Related   . Sulfamethoxazole-Trimethoprim     REACTION: [place] all over  . Vicodin [Hydrocodone-Acetaminophen]     itching    ROS  Ten  systems reviewed and is negative except as mentioned in HPI  Objective  Vitals:   10/16/17 1036  BP: 124/70  Pulse: 86  Resp: 16  Temp: 98.4 F (36.9 C)  TempSrc: Oral  SpO2: 94%  Weight: 192 lb 4.8 oz (87.2 kg)  Height: 4' 11"  (1.499 m)   Body mass index is 38.84 kg/m.  Nursing Note and Vital Signs reviewed.  Physical Exam  Constitutional: Patient appears well-developed and well-nourished. Obese. No distress.  HEENT: head atraumatic, normocephalic, pupils equal and reactive to light, EOM's intact, TM's without erythema or bulging, positive for maxillary and frontal sinus tenderness, neck supple with bilateral submandibular lymphadenopathy and LEFT mild preauricular lymphadenopathy, oropharynx mildly erythematous and moist without exudate Cardiovascular: Normal rate, regular rhythm, S1/S2 present.  No murmur or rub heard. No BLE edema. Skin: Erythematous patches under bilateral breasts and in bilateral groin.  No excoriation, no bleeding or exudate - consistent with candida intertrigo. Psychiatric: Patient has a normal mood and affect. behavior is normal. Judgment and thought content normal.  No results found for this or any previous visit (from the past 72 hour(s)).  Assessment & Plan  1. Acute recurrent pansinusitis - doxycycline (VIBRA-TABS) 100 MG tablet; Take 1 tablet (100 mg total) by mouth 2 (two) times daily for 7 days.  Dispense: 14 tablet; Refill: 0 - loratadine (CLARITIN) 10 MG tablet; Take 1 tablet (10 mg total) by mouth daily.  Dispense: 30 tablet; Refill: 0 - Use flonase - Follow up with Dr. Virgia Land ASAP.  2. Candidal intertrigo - fluconazole (DIFLUCAN) 150 MG tablet; Take 1 tablet (150 mg total) by mouth once for 1 dose. May repeat in 72 hours if not improving  Dispense: 1 tablet; Refill: 1  3. Vaginal candidiasis - fluconazole (DIFLUCAN) 150 MG tablet; Take 1 tablet (150 mg total) by mouth once for 1 dose. May repeat in 72 hours if not improving  Dispense: 1  tablet; Refill: 1  4. Type 2 diabetes, uncontrolled, with neuropathy ([place]) Sick day care discussed in detail  -Red flags and when to present for emergency care or RTC including fever >101.52F, chest pain, shortness of breath, new/worsening/un-resolving symptoms, pain with ocular movement, periorbital swelling reviewed with patient at time of visit. Follow up and care instructions discussed and provided in AVS.

## 2017-10-16 NOTE — Patient Instructions (Addendum)
Diabetes Mellitus and Sick Day Management Blood sugar (glucose) can be difficult to control when you are sick. Common illnesses that can cause problems for people with diabetes (diabetes mellitus) include colds, fever, flu (influenza), nausea, vomiting, and diarrhea. These illnesses can cause stress and loss of body fluids (dehydration), and those issues can cause blood glucose levels to increase. Because of this, it is very important to take your insulin and diabetes medicines and eat some form of carbohydrate when you are sick. You should make a plan for days when you are sick (sick day plan) as part of your diabetes management plan. You and your health care provider should make this plan in advance. The following guidelines are intended to help you manage an illness that lasts for about 24 hours or less. Your health care provider may also give you more specific instructions. What do I need to do to manage my blood glucose?  Check your blood glucose every 2-4 hours, or as often as told by your health care provider.  Know your sick day treatment goals. Your target blood glucose levels may be different when you are sick.  If you use insulin, take your usual dose. ? If your blood glucose continues to be too high, you may need to take an additional insulin dose as told by your health care provider.  If you use oral diabetes medicine, you may need to stop taking it if you are not able to eat or drink normally. Ask your health care provider about whether you need to stop taking these medicines while you are sick.  If you use injectable hormone medicines other than insulin to control your diabetes, ask your health care provider about whether you need to stop taking these medicines while you are sick. What else can I do to manage my diabetes when I am sick? Check your ketones  If you have type 1 diabetes, check your urine ketones every 4 hours.  If you have type 2 diabetes, check your urine ketones as  often as told by your health care provider. Drink fluids  Drink enough fluid to keep your urine clear or pale yellow. This is especially important if you have a fever, vomiting, or diarrhea. Those symptoms can lead to dehydration.  Follow any instructions from your health care provider about beverages to avoid. ? Do not drink alcohol, caffeine, or drinks that contain a lot of sugar. Take medicines as directed  Take-over-the-counter and prescription medicines only as told by your health care provider.  Check medicine labels for added sugars. Some medicines may contain sugar or types of sugars that can raise your blood glucose level. What foods can I eat when I am sick? You need to eat some form of carbohydrates when you are sick. You should eat 45-50 grams (45-50 g) of carbohydrates every 3-4 hours until you feel better. All of the food choices below contain about 15 g of carbohydrates. Plan ahead and keep some of these foods around so you have them if you get sick.  4-6 oz (120-177 mL) carbonated beverage that contains sugar, such as regular (not diet) soda. You may be able to drink carbonated beverages more easily if you open the beverage and let it sit at room temperature for a few minutes before drinking.   of a twin frozen ice pop.  4 oz (120 g) regular gelatin.  4 oz (120 mL) fruit juice.  4 oz (120 g) ice cream or frozen yogurt.  2 oz (60  g) sherbet.  8 oz (240 mL) clear broth or soup.  4 oz (120 g) regular custard.  4 oz (120 g) regular pudding.  8 oz (240 g) plain yogurt.  1 slice bread or toast.  6 saltine crackers.  5 vanilla wafers.  Questions to ask your health care provider Consider asking the following questions so you know what to do on days when you are sick:  Should I adjust my diabetes medicines?  How often do I need to check my blood glucose?  What supplies do I need to manage my diabetes at home when I am sick?  What number can I call if I have  questions?  What foods and drinks should I avoid?  Contact a health care provider if:  You develop symptoms of diabetic ketoacidosis, such as: ? Fatigue. ? Weight loss. ? Excessive thirst. ? Light-headedness. ? Fruity or sweet-smelling breath. ? Excessive urination. ? Vision changes. ? Confusion or irritability. ? Nausea. ? Vomiting. ? Rapid breathing. ? Pain in the abdomen. ? Feeling flushed.  You are unable to drink fluids without vomiting.  You have any of the following for more than 6 hours: ? Nausea. ? Vomiting. ? Diarrhea.  Your blood glucose is at or above 240 mg/dL (13.3 mmol/L), even after you take an additional insulin dose.  You have a change in how you think, feel, or act (mental status).  You develop another serious illness.  You have been sick or have had a fever for 2 days or longer and you are not getting better. Get help right away if:  Your blood glucose is lower than 54 mg/dL (3.0 mmol/L).  You have difficulty breathing.  You have moderate or high ketone levels in your urine.  You used emergency glucagon to treat low blood glucose. Summary  Blood sugar (glucose) can be difficult to control when you are sick. Common illnesses that can cause problems for people with diabetes (diabetes mellitus) include colds, fever, flu (influenza), nausea, vomiting, and diarrhea.  Illnesses can cause stress and loss of body fluids (dehydration), and those issues can cause blood glucose levels to increase.  Make a plan for days when you are sick (sick day plan) as part of your diabetes management plan. You and your health care provider should make this plan in advance.  It is very important to take your insulin and diabetes medicines and to eat some form of carbohydrate when you are sick.  Contact your health care provider if have problems managing your blood glucose levels when you are sick, or if you have been sick or had a fever for 2 days or longer and are  not getting better. This information is not  Vaginal Yeast infection, Adult Vaginal yeast infection is a condition that causes soreness, swelling, and redness (inflammation) of the vagina. It also causes vaginal discharge. This is a common condition. Some women get this infection frequently. What are the causes? This condition is caused by a change in the normal balance of the yeast (candida) and bacteria that live in the vagina. This change causes an overgrowth of yeast, which causes the inflammation. What increases the risk? This condition is more likely to develop in:  Women who take antibiotic medicines.  Women who have diabetes.  Women who take birth control pills.  Women who are pregnant.  Women who douche often.  Women who have a weak defense (immune) system.  Women who have been taking steroid medicines for a long time.  Women who frequently wear tight clothing.  What are the signs or symptoms? Symptoms of this condition include:  White, thick vaginal discharge.  Swelling, itching, redness, and irritation of the vagina. The lips of the vagina (vulva) may be affected as well.  Pain or a burning feeling while urinating.  Pain during sex.  How is this diagnosed? This condition is diagnosed with a medical history and physical exam. This will include a pelvic exam. Your health care provider will examine a sample of your vaginal discharge under a microscope. Your health care provider may send this sample for testing to confirm the diagnosis. How is this treated? This condition is treated with medicine. Medicines may be over-the-counter or prescription. You may be told to use one or more of the following:  Medicine that is taken orally.  Medicine that is applied as a cream.  Medicine that is inserted directly into the vagina (suppository).  Follow these instructions at home:  Take or apply over-the-counter and prescription medicines only as told by your health care  provider.  Do not have sex until your health care provider has approved. Tell your sex partner that you have a yeast infection. That person should go to his or her health care provider if he or she develops symptoms.  Do not wear tight clothes, such as pantyhose or tight pants.  Avoid using tampons until your health care provider approves.  Eat more yogurt. This may help to keep your yeast infection from returning.  Try taking a sitz bath to help with discomfort. This is a warm water bath that is taken while you are sitting down. The water should only come up to your hips and should cover your buttocks. Do this 3-4 times per day or as told by your health care provider.  Do not douche.  Wear breathable, cotton underwear.  If you have diabetes, keep your blood sugar levels under control. Contact a health care provider if:  You have a fever.  Your symptoms go away and then return.  Your symptoms do not get better with treatment.  Your symptoms get worse.  You have new symptoms.  You develop blisters in or around your vagina.  You have blood coming from your vagina and it is not your menstrual period.  You develop pain in your abdomen. This information is not intended to replace advice given to you by your health care provider. Make sure you discuss any questions you have with your health care provider. Document Released: 05/31/2005 Document Revised: 02/02/2016 Document Reviewed: 02/22/2015 Elsevier Interactive Patient Education  [employer].  intended to replace advice given to you by your health care provider. Make sure you discuss any questions you have with your health care provider. Document Released: 08/24/2003 Document Revised: 05/19/2016 Document Reviewed: 05/19/2016 Elsevier Interactive Patient Education  2018 [employer].  Sinusitis, Adult Sinusitis is soreness and inflammation of your sinuses. Sinuses are hollow spaces in the bones around your face. They are  located:  Around your eyes.  In the middle of your forehead.  Behind your nose.  In your cheekbones.  Your sinuses and nasal passages are lined with a stringy fluid (mucus). Mucus normally drains out of your sinuses. When your nasal tissues get inflamed or swollen, the mucus can get trapped or blocked so air cannot flow through your sinuses. This lets bacteria, viruses, and funguses grow, and that leads to infection. Follow these instructions at home: Medicines  Take, use, or apply over-the-counter and prescription medicines  only as told by your doctor. These may include nasal sprays.  If you were prescribed an antibiotic medicine, take it as told by your doctor. Do not stop taking the antibiotic even if you start to feel better. Hydrate and Humidify  Drink enough water to keep your pee (urine) clear or pale yellow.  Use a cool mist humidifier to keep the humidity level in your home above 50%.  Breathe in steam for 10-15 minutes, 3-4 times a day or as told by your doctor. You can do this in the bathroom while a hot shower is running.  Try not to spend time in cool or dry air. Rest  Rest as much as possible.  Sleep with your head raised (elevated).  Make sure to get enough sleep each night. General instructions  Put a warm, moist washcloth on your face 3-4 times a day or as told by your doctor. This will help with discomfort.  Wash your hands often with soap and water. If there is no soap and water, use hand sanitizer.  Do not smoke. Avoid being around people who are smoking (secondhand smoke).  Keep all follow-up visits as told by your doctor. This is important. Contact a doctor if:  You have a fever.  Your symptoms get worse.  Your symptoms do not get better within 10 days. Get help right away if:  You have a very bad headache.  You cannot stop throwing up (vomiting).  You have pain or swelling around your face or eyes.  You have trouble seeing.  You feel  confused.  Your neck is stiff.  You have trouble breathing. This information is not intended to replace advice given to you by your health care provider. Make sure you discuss any questions you have with your health care provider. Document Released: 02/07/2008 Document Revised: 04/16/2016 Document Reviewed: 06/16/2015 Elsevier Interactive Patient Education  [employer].

## 2017-10-21 NOTE — Progress Notes (Signed)
Closing out old abstraction note from October 2018

## 2017-10-21 NOTE — Progress Notes (Signed)
Closing out scanned document note open since  Nov 2018 Already wet signed

## 2017-10-23 LAB — HM DIABETES EYE EXAM

## 2017-10-24 ENCOUNTER — Other Ambulatory Visit: Payer: Self-pay

## 2017-10-24 MED ORDER — LEVOTHYROXINE SODIUM 75 MCG PO TABS: 75.0000 ug | ORAL_TABLET | Freq: Every day | ORAL | 11 refills | Status: DC

## 2017-10-24 NOTE — Telephone Encounter (Signed)
Lab Results  Component Value Date   TSH 0.94 09/10/2017

## 2017-10-30 ENCOUNTER — Encounter: Payer: Self-pay | Admitting: Family Medicine

## 2017-10-30 ENCOUNTER — Ambulatory Visit (INDEPENDENT_AMBULATORY_CARE_PROVIDER_SITE_OTHER): Payer: Medicare HMO | Admitting: Family Medicine

## 2017-10-30 DIAGNOSIS — Z0000 Encounter for general adult medical examination without abnormal findings: Secondary | ICD-10-CM | POA: Diagnosis not present

## 2017-10-30 DIAGNOSIS — Z789 Other specified health status: Secondary | ICD-10-CM | POA: Diagnosis not present

## 2017-10-30 MED ORDER — DOXYCYCLINE HYCLATE 100 MG PO TABS: 100.0000 mg | ORAL_TABLET | Freq: Two times a day (BID) | ORAL | 0 refills | Status: DC

## 2017-10-30 MED ORDER — FLUCONAZOLE 150 MG PO TABS: 150.0000 mg | ORAL_TABLET | Freq: Once | ORAL | 0 refills | Status: AC

## 2017-10-30 MED ORDER — LINACLOTIDE 72 MCG PO CAPS: 72.0000 ug | ORAL_CAPSULE | Freq: Every day | ORAL | 3 refills | Status: DC

## 2017-10-30 MED ORDER — PANTOPRAZOLE SODIUM 40 MG PO TBEC: 40.0000 mg | DELAYED_RELEASE_TABLET | Freq: Every day | ORAL | 3 refills | Status: DC

## 2017-10-30 NOTE — Assessment & Plan Note (Signed)
USPSTF grade A and B recommendations reviewed with patient; age-appropriate recommendations, preventive care, screening tests, etc discussed and encouraged; healthy living encouraged; see AVS for patient education given to patient  

## 2017-10-30 NOTE — Progress Notes (Signed)
Patient: [name], Female    DOB: [birth year], 52 y.o.   MRN: [id number]  Visit Date: 10/30/2017  Today's Provider: Enid Derry, MD   Chief Complaint  Patient presents with  . Medicare Wellness    Subjective:   [name] is a 52 y.o. female who presents today for her Subsequent Annual Wellness Visit.  Current problems: Patient wishes to go back on protonix for heartburn, reflux She would also like to have linzess Stopped both and really had symptoms; epigastric pain; no blood in the stool Constipated; 72 mg really helped  Also having sinus problems; thought she had sinus infection; left side is worse Drainage down the throat; sore over the eyes, behind the nose and the upper jaw "the same" No drainage; just pressure; no sore throat; ears are popping; no fevers  Type 2 diabetes; seeing Dr. Ronnald Collum; sugars going down to 50s and 18E now; on trulicity; not having to have her extra shot  USPSTF grade A and B recommendations Depression:  Depression screen [facility] 2/9 10/30/2017 10/09/2017 09/21/2017 08/22/2017 08/16/2017  Decreased Interest 0 0 0 0 0  Down, Depressed, Hopeless 0 0 0 0 0  PHQ - 2 Score 0 0 0 0 0  Some recent data might be hidden   Hypertension: BP Readings from Last 3 Encounters:  10/30/17 124/76  10/16/17 124/70  10/09/17 100/70   Obesity: down 12 pounds from before Wt Readings from Last 3 Encounters:  10/30/17 192 lb 4.8 oz (87.2 kg)  10/16/17 192 lb 4.8 oz (87.2 kg)  10/09/17 194 lb 3.2 oz (88.1 kg)   BMI Readings from Last 3 Encounters:  10/30/17 37.56 kg/m  10/16/17 38.84 kg/m  10/09/17 39.22 kg/m    Skin cancer: no worrisome moles Lung cancer:  nonsmoker Breast cancer: declined CBE; will have mammo soon; appt scheduled Colorectal cancer: 2015; next 10 years Cervical cancer screening: s/p hyst BRCA gene screening: family hx of breast and/or ovarian cancer and/or metastatic prostate cancer? no HIV, hep B, hep C: not interested STD testing  and prevention (chl/gon/syphilis): not interested Intimate partner violence: no abuse Contraception: n/a Osteoporosis: n/a Fall prevention/vitamin D: discussed Immunizations: chart says PCV-13 already given, she has also had two PPSV-23 injections (2009, 2014); tetanus UTD; shingles vaccine Diet: doing better; losing 12 pounds Exercise: walking Alcohol: no Tobacco use: never AAA: n/a Aspirin: taking daily aspirin; no bruising or bleeding Glucose:  Glucose  Date Value Ref Range Status  10/09/2014 391 (H) 65 - 99 mg/dL Final  09/13/2014 216 (H) 65 - 99 mg/dL Final  06/29/2014 304 (H) 65 - 99 mg/dL Final   Glucose, Bld  Date Value Ref Range Status  09/22/2017 137 (H) 65 - 99 mg/dL Final  09/10/2017 202 (H) 65 - 99 mg/dL Final    Comment:    .            Fasting reference interval . For someone without known diabetes, a glucose value >125 mg/dL indicates that they may have diabetes and this should be confirmed with a follow-up test. .   09/01/2017 364 (H) 65 - 99 mg/dL Final   Glucose-Capillary  Date Value Ref Range Status  09/01/2017 85 65 - 99 mg/dL Final  09/01/2017 78 65 - 99 mg/dL Final  09/01/2017 416 (H) 65 - 99 mg/dL Final   Lipids:  Lab Results  Component Value Date   CHOL 196 09/28/2016   CHOL 296 (H) 04/18/2016   CHOL 517 (H) 01/13/2016   Lab Results  Component Value Date   HDL 33 (L) 09/28/2016   HDL NOT REPORTED DUE TO HIGH TRIGLYCERIDES 04/18/2016   HDL NOT REPORTED DUE TO HIGH TRIGLYCERIDES 01/13/2016   Lab Results  Component Value Date   LDLCALC NOT CALC 09/28/2016   LDLCALC UNABLE TO CALCULATE IF TRIGLYCERIDE OVER 400 mg/dL 04/18/2016   LDLCALC UNABLE TO CALCULATE IF TRIGLYCERIDE OVER 400 mg/dL 01/13/2016   Lab Results  Component Value Date   TRIG 856 (H) 09/28/2016   TRIG 2,813 (H) 04/18/2016   TRIG >5,000 (H) 01/13/2016   Lab Results  Component Value Date   CHOLHDL 5.9 (H) 09/28/2016   CHOLHDL NOT REPORTED DUE TO HIGH TRIGLYCERIDES  04/18/2016   CHOLHDL NOT REPORTED DUE TO HIGH TRIGLYCERIDES 01/13/2016   Lab Results  Component Value Date   LDLDIRECT 65.4 06/08/2008   LDLDIRECT 83.3 10/18/2007   LDLDIRECT 130.7 07/03/2007     Review of Systems  Past Medical History:  Diagnosis Date  . Asthma   . Bipolar depression ([place])   . CHF (congestive heart failure) ([place])   . Chronic headaches   . COPD (chronic obstructive pulmonary disease) ([place])   . Diabetic neuropathy ([place])   . GERD (gastroesophageal reflux disease)   . Headache   . Heart attack ([place])   . Hyperlipidemia   . Hypertension   . Hypomagnesemia   . Hypothyroid   . Irregular heart rhythm   . Liver lesion    Favored to be benign on CT; MRI of abd w/contract in Aug 2016. Liver Biopsy-Negative, March 2016  . Morbid obesity ([place])   . OSA (obstructive sleep apnea)   . Pinched nerve    Back  . Pneumonia   . RLS (restless legs syndrome)   . Seasonal allergies   . Sleep apnea   . Type 2 diabetes, uncontrolled, with neuropathy ([place]) 10/15/2015   Overview:  Microalbumin 7.7 12/2010: Hgb A1c 10.2% 12/2010 and 9.7% 03/2011, eye exam 10/2009 [facility]   . Urinary incontinence   . Vitamin D deficiency     Past Surgical History:  Procedure Laterality Date  . ABDOMINAL HYSTERECTOMY    . ABDOMINAL HYSTERECTOMY     Partial  . BLADDER SURGERY     x 2  . CHOLECYSTECTOMY    . LIVER BIOPSY  March 2016   Negative  . LUNG BIOPSY    . TUBAL LIGATION      Family History  Problem Relation Age of Onset  . Diabetes Mother   . Heart disease Mother   . Hyperlipidemia Mother   . Hypertension Mother   . Kidney disease Mother   . Mental illness Mother   . Hypothyroidism Mother   . Stroke Mother   . Osteoporosis Mother   . Glaucoma Mother   . Congestive Heart Failure Mother   . Hypertension Father   . Asthma Daughter   . Cancer Daughter        throat  . Bipolar disorder Daughter   . Heart disease Maternal Grandfather   . Rheum arthritis Maternal  Grandfather   . Cancer Maternal Grandfather        liver  . Kidney disease Maternal Grandfather   . Cancer Paternal Grandmother        lung  . Kidney disease Brother   . Bipolar disorder Daughter   . Hypertension Daughter   . Restless legs syndrome Daughter     Social History   Tobacco Use  . Smoking status: Never Smoker  .  Smokeless tobacco: Never Used  Substance Use Topics  . Alcohol use: No  . Drug use: No    Outpatient Encounter Medications as of 10/30/2017  Medication Sig  . albuterol (PROVENTIL HFA;VENTOLIN HFA) 108 (90 Base) MCG/ACT inhaler Inhale 2 puffs into the lungs every 4 (four) hours as needed for wheezing or shortness of breath.  Marland Kitchen amLODipine (NORVASC) 2.5 MG tablet Take 2.5 mg by mouth daily.  Marland Kitchen aspirin EC 81 MG tablet Take 1 tablet (81 mg total) by mouth daily.  Marland Kitchen atorvastatin (LIPITOR) 40 MG tablet Take 1 tablet (40 mg total) by mouth at bedtime.  . budesonide-formoterol (SYMBICORT) 160-4.5 MCG/ACT inhaler Inhale 2 puffs into the lungs 2 (two) times daily.  . cholecalciferol (VITAMIN D) 1000 units tablet Take 1,000 Units by mouth daily.  . DULoxetine (CYMBALTA) 60 MG capsule Take 120 mg by mouth daily.  Marland Kitchen ezetimibe (ZETIA) 10 MG tablet Take 10 mg by mouth daily.   . fenofibrate micronized (LOFIBRA) 134 MG capsule   . fluticasone (FLONASE) 50 MCG/ACT nasal spray Place 1 spray into both nostrils daily.  Marland Kitchen gabapentin (NEURONTIN) 300 MG capsule Take 1 capsule (300 mg total) by mouth 2 (two) times daily.  Marland Kitchen HUMULIN R U-500 KWIKPEN 500 UNIT/ML kwikpen Inject 60 Units as directed 3 (three) times daily.  . hydrOXYzine (VISTARIL) 50 MG capsule Take 50 mg by mouth 2 (two) times daily as needed.   . insulin aspart (NOVOLOG) 100 UNIT/ML injection Inject into the skin 3 (three) times daily before meals.  Marland Kitchen JARDIANCE 25 MG TABS tablet Take 25 mg by mouth daily.  Marland Kitchen lamoTRIgine (LAMICTAL) 200 MG tablet Take 200 mg daily by mouth.  . lamoTRIgine (LAMICTAL) 25 MG tablet Take 25  mg by mouth 2 (two) times daily.  Marland Kitchen levothyroxine (SYNTHROID, LEVOTHROID) 75 MCG tablet Take 1 tablet (75 mcg total) by mouth daily.  Marland Kitchen loratadine (CLARITIN) 10 MG tablet Take 1 tablet (10 mg total) by mouth daily.  . magnesium oxide (MAG-OX) 400 MG tablet Take 400 mg by mouth daily.  . metoprolol tartrate (LOPRESSOR) 25 MG tablet Take 1 tablet (25 mg total) by mouth 2 (two) times daily.  . pantoprazole (PROTONIX) 40 MG tablet Take 1 tablet (40 mg total) by mouth daily.  . QUEtiapine (SEROQUEL) 300 MG tablet Take 300 mg by mouth 2 (two) times daily.  . traMADol (ULTRAM) 50 MG tablet Take 1 tablet (50 mg total) by mouth every 6 (six) hours as needed.  . traZODone (DESYREL) 50 MG tablet Take 50 mg by mouth 2 (two) times daily.  . [DISCONTINUED] pantoprazole (PROTONIX) 40 MG tablet Take 1 tablet (40 mg total) by mouth daily.  Marland Kitchen doxycycline (VIBRA-TABS) 100 MG tablet Take 1 tablet (100 mg total) by mouth 2 (two) times daily.  . fluconazole (DIFLUCAN) 150 MG tablet Take 1 tablet (150 mg total) by mouth once for 1 dose. (don't take cholesterol med for 3 days)  . linaclotide (LINZESS) 72 MCG capsule Take 1 capsule (72 mcg total) by mouth daily before breakfast.  . ONETOUCH VERIO test strip   . oseltamivir (TAMIFLU) 75 MG capsule Take 1 capsule (75 mg total) by mouth 2 (two) times daily. (Patient not taking: Reported on 10/30/2017)  . TRULICITY 1.5 IF/0.2DX SOPN   . ULTICARE SHORT PEN NEEDLES 31G X 8 MM MISC   . [DISCONTINUED] fluconazole (DIFLUCAN) 150 MG tablet   . [DISCONTINUED] lamoTRIgine (LAMICTAL) 150 MG tablet Take 100 mg by mouth daily.   . [DISCONTINUED] linaclotide Rolan Lipa)  72 MCG capsule Take 1 capsule (72 mcg total) by mouth daily before breakfast. (Patient not taking: Reported on 10/16/2017)  . [DISCONTINUED] betamethasone acetate-betamethasone sodium phosphate (CELESTONE) injection 3 mg   . [DISCONTINUED] betamethasone acetate-betamethasone sodium phosphate (CELESTONE) injection 3 mg     No facility-administered encounter medications on file as of 10/30/2017.     Functional Ability / Safety Screening 1.  Was the timed Get Up and Go test less than 12 seconds?  no 2.  Does the patient need help with the phone, transportation, shopping,      preparing meals, housework, laundry, medications, or managing money?  yes, transportation; has to borrow a car; patient does not drive 3.  Does the patient's home have:  loose throw rugs in the hallway?   no      Grab bars in the bathroom? yes, outside of the tub      Handrails on the stairs?   yes      Good lighting?   yes 4.  Has the patient noticed any hearing difficulties?   no  Advanced Directives Does patient have a HCPOA?    no If yes, name and contact information: husband [name], [phone] Does patient have a living will or MOST form?  no Full code Immunizations: discussed  Fall Risk Assessment See under rooming  Depression Screen See under rooming Depression screen [facility] 2/9 10/30/2017 10/09/2017 09/21/2017 08/22/2017 08/16/2017  Decreased Interest 0 0 0 0 0  Down, Depressed, Hopeless 0 0 0 0 0  PHQ - 2 Score 0 0 0 0 0  Some recent data might be hidden    Objective:   Vitals: BP 124/76   Pulse 97   Temp 97.9 F (36.6 C) (Oral)   Resp 16   Ht 5' (1.524 m)   Wt 192 lb 4.8 oz (87.2 kg)   SpO2 95%   BMI 37.56 kg/m  Body mass index is 37.56 kg/m. No exam data present  Physical Exam Mood/affect:  Euthymic, good eye contact Appearance:  Obese, nontoxic  6CIT Screen 10/30/2017  What Year? 0 points  What month? 0 points  What time? 0 points  Count back from 20 2 points  Months in reverse 0 points  Repeat phrase 2 points  Total Score 4    Assessment & Plan:     Annual Wellness Visit  Reviewed patient's Family Medical History Reviewed and updated list of patient's medical providers Assessment of cognitive impairment was done Assessed patient's functional ability Established a written schedule for  health screening [place] Completed and Reviewed  Exercise Activities and Dietary recommendations Goals    . Weight (lb) < 180 lb (81.6 kg) (pt-stated)       Immunization History  Administered Date(s) Administered  . Influenza Split 10/06/2013  . Influenza Whole 07/17/2007, 06/08/2008, 09/23/2011  . Influenza, Seasonal, Injecte, Preservative Fre 09/26/2012  . Influenza,inj,Quad PF,6+ Mos 05/13/2015, 09/28/2016  . Influenza-Unspecified 05/26/2014  . Pneumococcal Conjugate-13 09/23/2011, 11/16/2011  . Pneumococcal Polysaccharide-23 11/15/2007, 05/09/2013  . Tdap 05/13/2012, 04/01/2014    Health Maintenance  Topic Date Due  . HEMOGLOBIN A1C  11/21/2017 (Originally 08/21/2017)  . MAMMOGRAM  11/28/2017 (Originally 11/12/2013)  . FOOT EXAM  10/09/2018  . URINE MICROALBUMIN  10/09/2018  . OPHTHALMOLOGY EXAM  10/23/2018  . PAP SMEAR  09/29/2019  . TETANUS/TDAP  04/01/2024  . COLONOSCOPY  06/17/2024  . PNEUMOCOCCAL POLYSACCHARIDE VACCINE  Completed  . INFLUENZA VACCINE  Completed  . Hepatitis C Screening  Completed  .  HIV Screening  Completed    Discussed health benefits of physical activity, and encouraged her to engage in regular exercise appropriate for her age and condition.   Meds ordered this encounter  Medications  . fluconazole (DIFLUCAN) 150 MG tablet    Sig: Take 1 tablet (150 mg total) by mouth once for 1 dose. (don't take cholesterol med for 3 days)    Dispense:  1 tablet    Refill:  0  . linaclotide (LINZESS) 72 MCG capsule    Sig: Take 1 capsule (72 mcg total) by mouth daily before breakfast.    Dispense:  90 capsule    Refill:  3  . pantoprazole (PROTONIX) 40 MG tablet    Sig: Take 1 tablet (40 mg total) by mouth daily.    Dispense:  90 tablet    Refill:  3  . doxycycline (VIBRA-TABS) 100 MG tablet    Sig: Take 1 tablet (100 mg total) by mouth 2 (two) times daily.    Dispense:  28 tablet    Refill:  0    Current Outpatient  Medications:  .  albuterol (PROVENTIL HFA;VENTOLIN HFA) 108 (90 Base) MCG/ACT inhaler, Inhale 2 puffs into the lungs every 4 (four) hours as needed for wheezing or shortness of breath., Disp: , Rfl:  .  amLODipine (NORVASC) 2.5 MG tablet, Take 2.5 mg by mouth daily., Disp: , Rfl:  .  aspirin EC 81 MG tablet, Take 1 tablet (81 mg total) by mouth daily., Disp: , Rfl:  .  atorvastatin (LIPITOR) 40 MG tablet, Take 1 tablet (40 mg total) by mouth at bedtime., Disp: 90 tablet, Rfl: 1 .  budesonide-formoterol (SYMBICORT) 160-4.5 MCG/ACT inhaler, Inhale 2 puffs into the lungs 2 (two) times daily., Disp: , Rfl:  .  cholecalciferol (VITAMIN D) 1000 units tablet, Take 1,000 Units by mouth daily., Disp: , Rfl:  .  DULoxetine (CYMBALTA) 60 MG capsule, Take 120 mg by mouth daily., Disp: , Rfl:  .  ezetimibe (ZETIA) 10 MG tablet, Take 10 mg by mouth daily. , Disp: , Rfl:  .  fenofibrate micronized (LOFIBRA) 134 MG capsule, , Disp: , Rfl:  .  fluticasone (FLONASE) 50 MCG/ACT nasal spray, Place 1 spray into both nostrils daily., Disp: , Rfl:  .  gabapentin (NEURONTIN) 300 MG capsule, Take 1 capsule (300 mg total) by mouth 2 (two) times daily., Disp: 60 capsule, Rfl: 5 .  HUMULIN R U-500 KWIKPEN 500 UNIT/ML kwikpen, Inject 60 Units as directed 3 (three) times daily., Disp: , Rfl:  .  hydrOXYzine (VISTARIL) 50 MG capsule, Take 50 mg by mouth 2 (two) times daily as needed. , Disp: , Rfl:  .  insulin aspart (NOVOLOG) 100 UNIT/ML injection, Inject into the skin 3 (three) times daily before meals., Disp: , Rfl:  .  JARDIANCE 25 MG TABS tablet, Take 25 mg by mouth daily., Disp: , Rfl:  .  lamoTRIgine (LAMICTAL) 200 MG tablet, Take 200 mg daily by mouth., Disp: , Rfl:  .  lamoTRIgine (LAMICTAL) 25 MG tablet, Take 25 mg by mouth 2 (two) times daily., Disp: , Rfl:  .  levothyroxine (SYNTHROID, LEVOTHROID) 75 MCG tablet, Take 1 tablet (75 mcg total) by mouth daily., Disp: 30 tablet, Rfl: 11 .  loratadine (CLARITIN) 10 MG  tablet, Take 1 tablet (10 mg total) by mouth daily., Disp: 30 tablet, Rfl: 0 .  magnesium oxide (MAG-OX) 400 MG tablet, Take 400 mg by mouth daily., Disp: , Rfl:  .  metoprolol tartrate (LOPRESSOR)  25 MG tablet, Take 1 tablet (25 mg total) by mouth 2 (two) times daily., Disp: 60 tablet, Rfl: 6 .  pantoprazole (PROTONIX) 40 MG tablet, Take 1 tablet (40 mg total) by mouth daily., Disp: 90 tablet, Rfl: 3 .  QUEtiapine (SEROQUEL) 300 MG tablet, Take 300 mg by mouth 2 (two) times daily., Disp: , Rfl:  .  traMADol (ULTRAM) 50 MG tablet, Take 1 tablet (50 mg total) by mouth every 6 (six) hours as needed., Disp: 20 tablet, Rfl: 0 .  traZODone (DESYREL) 50 MG tablet, Take 50 mg by mouth 2 (two) times daily., Disp: , Rfl:  .  doxycycline (VIBRA-TABS) 100 MG tablet, Take 1 tablet (100 mg total) by mouth 2 (two) times daily., Disp: 28 tablet, Rfl: 0 .  fluconazole (DIFLUCAN) 150 MG tablet, Take 1 tablet (150 mg total) by mouth once for 1 dose. (don't take cholesterol med for 3 days), Disp: 1 tablet, Rfl: 0 .  linaclotide (LINZESS) 72 MCG capsule, Take 1 capsule (72 mcg total) by mouth daily before breakfast., Disp: 90 capsule, Rfl: 3 .  ONETOUCH VERIO test strip, , Disp: , Rfl:  .  oseltamivir (TAMIFLU) 75 MG capsule, Take 1 capsule (75 mg total) by mouth 2 (two) times daily. (Patient not taking: Reported on 10/30/2017), Disp: 10 capsule, Rfl: 0 .  TRULICITY 1.5 SU/1.1SR SOPN, , Disp: , Rfl:  .  ULTICARE SHORT PEN NEEDLES 31G X 8 MM MISC, , Disp: , Rfl:  Medications Discontinued During This Encounter  Medication Reason  . betamethasone acetate-betamethasone sodium phosphate (CELESTONE) injection 3 mg   . betamethasone acetate-betamethasone sodium phosphate (CELESTONE) injection 3 mg   . lamoTRIgine (LAMICTAL) 150 MG tablet Dose change  . fluconazole (DIFLUCAN) 150 MG tablet Reorder  . linaclotide (LINZESS) 72 MCG capsule Reorder  . pantoprazole (PROTONIX) 40 MG tablet Reorder    Next Medicare Wellness  Visit in 12+ months  Problem List Items Addressed This Visit      Other   Patient is full code    Discussed today; paperwork given      Relevant Orders   Full code   Encounter for Medicare annual wellness exam    USPSTF grade A and B recommendations reviewed with patient; age-appropriate recommendations, preventive care, screening tests, etc discussed and encouraged; healthy living encouraged; see AVS for patient education given to patient

## 2017-10-30 NOTE — Patient Instructions (Addendum)
Call your diabetes doctor about decreasing your insulin if your sugars are dropping Start the antibiotics Please do eat yogurt or kimchi or take a probiotic daily for the next month We want to replace the healthy germs in the gut If you notice foul, watery diarrhea in the next two months, schedule an appointment RIGHT AWAY or go to an urgent care or the emergency room if a holiday or over a weekend Consider getting the new shingles vaccine called Shingrix; that is available for individuals 52 years of age and older, and is recommended even if you have had shingles in the past and/or already received the old shingles vaccine (Zostavax); it is a two-part series, and is available at many local pharmacies  Health Maintenance  Topic Date Due  . HEMOGLOBIN A1C  11/21/2017 (Originally 08/21/2017)  . MAMMOGRAM  11/28/2017 (Originally 11/12/2013)  . FOOT EXAM  10/09/2018  . URINE MICROALBUMIN  10/09/2018  . OPHTHALMOLOGY EXAM  10/23/2018  . PAP SMEAR  09/29/2019  . TETANUS/TDAP  04/01/2024  . COLONOSCOPY  06/17/2024  . PNEUMOCOCCAL POLYSACCHARIDE VACCINE  Completed  . INFLUENZA VACCINE  Completed  . Hepatitis C Screening  Completed  . HIV Screening  Completed   Health Maintenance, Female Adopting a healthy lifestyle and getting preventive care can go a long way to promote health and wellness. Talk with your health care provider about what schedule of regular examinations is right for you. This is a good chance for you to check in with your provider about disease prevention and staying healthy. In between checkups, there are plenty of things you can do on your own. Experts have done a lot of research about which lifestyle changes and preventive measures are most likely to keep you healthy. Ask your health care provider for more information. Weight and diet Eat a healthy diet  Be sure to include plenty of vegetables, fruits, low-fat dairy products, and lean protein.  Do not eat a lot of foods high  in solid fats, added sugars, or salt.  Get regular exercise. This is one of the most important things you can do for your health. ? Most adults should exercise for at least 150 minutes each week. The exercise should increase your heart rate and make you sweat (moderate-intensity exercise). ? Most adults should also do strengthening exercises at least twice a week. This is in addition to the moderate-intensity exercise.  Maintain a healthy weight  Body mass index (BMI) is a measurement that can be used to identify possible weight problems. It estimates body fat based on height and weight. Your health care provider can help determine your BMI and help you achieve or maintain a healthy weight.  For females 52 years of age and older: ? A BMI below 18.5 is considered underweight. ? A BMI of 18.5 to 24.9 is normal. ? A BMI of 25 to 29.9 is considered overweight. ? A BMI of 30 and above is considered obese.  Watch levels of cholesterol and blood lipids  You should start having your blood tested for lipids and cholesterol at 54 years of age, then have this test every 5 years.  You may need to have your cholesterol levels checked more often if: ? Your lipid or cholesterol levels are high. ? You are older than 54 years of age. ? You are at high risk for heart disease.  Cancer screening Lung Cancer  Lung cancer screening is recommended for adults 59-51 years old who are at high risk for lung  cancer because of a history of smoking.  A yearly low-dose CT scan of the lungs is recommended for people who: ? Currently smoke. ? Have quit within the past 15 years. ? Have at least a 30-pack-year history of smoking. A pack year is smoking an average of one pack of cigarettes a day for 1 year.  Yearly screening should continue until it has been 15 years since you quit.  Yearly screening should stop if you develop a health problem that would prevent you from having lung cancer treatment.  Breast  Cancer  Practice breast self-awareness. This means understanding how your breasts normally appear and feel.  It also means doing regular breast self-exams. Let your health care provider know about any changes, no matter how small.  If you are in your 20s or 30s, you should have a clinical breast exam (CBE) by a health care provider every 1-3 years as part of a regular health exam.  If you are 61 or older, have a CBE every year. Also consider having a breast X-ray (mammogram) every year.  If you have a family history of breast cancer, talk to your health care provider about genetic screening.  If you are at high risk for breast cancer, talk to your health care provider about having an MRI and a mammogram every year.  Breast cancer gene (BRCA) assessment is recommended for women who have family members with BRCA-related cancers. BRCA-related cancers include: ? Breast. ? Ovarian. ? Tubal. ? Peritoneal cancers.  Results of the assessment will determine the need for genetic counseling and BRCA1 and BRCA2 testing.  Cervical Cancer Your health care provider may recommend that you be screened regularly for cancer of the pelvic organs (ovaries, uterus, and vagina). This screening involves a pelvic examination, including checking for microscopic changes to the surface of your cervix (Pap test). You may be encouraged to have this screening done every 3 years, beginning at age 72.  For women ages 68-65, health care providers may recommend pelvic exams and Pap testing every 3 years, or they may recommend the Pap and pelvic exam, combined with testing for human papilloma virus (HPV), every 5 years. Some types of HPV increase your risk of cervical cancer. Testing for HPV may also be done on women of any age with unclear Pap test results.  Other health care providers may not recommend any screening for nonpregnant women who are considered low risk for pelvic cancer and who do not have symptoms. Ask your  health care provider if a screening pelvic exam is right for you.  If you have had past treatment for cervical cancer or a condition that could lead to cancer, you need Pap tests and screening for cancer for at least 20 years after your treatment. If Pap tests have been discontinued, your risk factors (such as having a new sexual partner) need to be reassessed to determine if screening should resume. Some women have medical problems that increase the chance of getting cervical cancer. In these cases, your health care provider may recommend more frequent screening and Pap tests.  Colorectal Cancer  This type of cancer can be detected and often prevented.  Routine colorectal cancer screening usually begins at 54 years of age and continues through 54 years of age.  Your health care provider may recommend screening at an earlier age if you have risk factors for colon cancer.  Your health care provider may also recommend using home test kits to check for hidden blood in the  stool.  A small camera at the end of a tube can be used to examine your colon directly (sigmoidoscopy or colonoscopy). This is done to check for the earliest forms of colorectal cancer.  Routine screening usually begins at age 46.  Direct examination of the colon should be repeated every 5-10 years through 54 years of age. However, you may need to be screened more often if early forms of precancerous polyps or small growths are found.  Skin Cancer  Check your skin from head to toe regularly.  Tell your health care provider about any new moles or changes in moles, especially if there is a change in a mole's shape or color.  Also tell your health care provider if you have a mole that is larger than the size of a pencil eraser.  Always use sunscreen. Apply sunscreen liberally and repeatedly throughout the day.  Protect yourself by wearing long sleeves, pants, a wide-brimmed hat, and sunglasses whenever you are  outside.  Heart disease, diabetes, and high blood pressure  High blood pressure causes heart disease and increases the risk of stroke. High blood pressure is more likely to develop in: ? People who have blood pressure in the high end of the normal range (130-139/85-89 mm Hg). ? People who are overweight or obese. ? People who are African American.  If you are 51-110 years of age, have your blood pressure checked every 3-5 years. If you are 80 years of age or older, have your blood pressure checked every year. You should have your blood pressure measured twice-once when you are at a hospital or clinic, and once when you are not at a hospital or clinic. Record the average of the two measurements. To check your blood pressure when you are not at a hospital or clinic, you can use: ? An automated blood pressure machine at a pharmacy. ? A home blood pressure monitor.  If you are between 68 years and 68 years old, ask your health care provider if you should take aspirin to prevent strokes.  Have regular diabetes screenings. This involves taking a blood sample to check your fasting blood sugar level. ? If you are at a normal weight and have a low risk for diabetes, have this test once every three years after 54 years of age. ? If you are overweight and have a high risk for diabetes, consider being tested at a younger age or more often. Preventing infection Hepatitis B  If you have a higher risk for hepatitis B, you should be screened for this virus. You are considered at high risk for hepatitis B if: ? You were born in a country where hepatitis B is common. Ask your health care provider which countries are considered high risk. ? Your parents were born in a high-risk country, and you have not been immunized against hepatitis B (hepatitis B vaccine). ? You have HIV or AIDS. ? You use needles to inject street drugs. ? You live with someone who has hepatitis B. ? You have had sex with someone who has  hepatitis B. ? You get hemodialysis treatment. ? You take certain medicines for conditions, including cancer, organ transplantation, and autoimmune conditions.  Hepatitis C  Blood testing is recommended for: ? Everyone born from [birth year] through 09/22/1965. ? Anyone with known risk factors for hepatitis C.  Sexually transmitted infections (STIs)  You should be screened for sexually transmitted infections (STIs) including gonorrhea and chlamydia if: ? You are sexually active and are younger  than 54 years of age. ? You are older than 54 years of age and your health care provider tells you that you are at risk for this type of infection. ? Your sexual activity has changed since you were last screened and you are at an increased risk for chlamydia or gonorrhea. Ask your health care provider if you are at risk.  If you do not have HIV, but are at risk, it may be recommended that you take a prescription medicine daily to prevent HIV infection. This is called pre-exposure prophylaxis (PrEP). You are considered at risk if: ? You are sexually active and do not regularly use condoms or know the HIV status of your partner(s). ? You take drugs by injection. ? You are sexually active with a partner who has HIV.  Talk with your health care provider about whether you are at high risk of being infected with HIV. If you choose to begin PrEP, you should first be tested for HIV. You should then be tested every 3 months for as long as you are taking PrEP. Pregnancy  If you are premenopausal and you may become pregnant, ask your health care provider about preconception counseling.  If you may become pregnant, take 400 to 800 micrograms (mcg) of folic acid every day.  If you want to prevent pregnancy, talk to your health care provider about birth control (contraception). Osteoporosis and menopause  Osteoporosis is a disease in which the bones lose minerals and strength with aging. This can result in serious bone  fractures. Your risk for osteoporosis can be identified using a bone density scan.  If you are 34 years of age or older, or if you are at risk for osteoporosis and fractures, ask your health care provider if you should be screened.  Ask your health care provider whether you should take a calcium or vitamin D supplement to lower your risk for osteoporosis.  Menopause may have certain physical symptoms and risks.  Hormone replacement therapy may reduce some of these symptoms and risks. Talk to your health care provider about whether hormone replacement therapy is right for you. Follow these instructions at home:  Schedule regular health, dental, and eye exams.  Stay current with your immunizations.  Do not use any tobacco products including cigarettes, chewing tobacco, or electronic cigarettes.  If you are pregnant, do not drink alcohol.  If you are breastfeeding, limit how much and how often you drink alcohol.  Limit alcohol intake to no more than 1 drink per day for nonpregnant women. One drink equals 12 ounces of beer, 5 ounces of wine, or 1 ounces of hard liquor.  Do not use street drugs.  Do not share needles.  Ask your health care provider for help if you need support or information about quitting drugs.  Tell your health care provider if you often feel depressed.  Tell your health care provider if you have ever been abused or do not feel safe at home. This information is not intended to replace advice given to you by your health care provider. Make sure you discuss any questions you have with your health care provider. Document Released: 03/06/2011 Document Revised: 01/27/2016 Document Reviewed: 05/25/2015 Elsevier Interactive Patient Education  [employer].

## 2017-10-30 NOTE — Assessment & Plan Note (Signed)
Discussed today; paperwork given

## 2017-11-05 ENCOUNTER — Other Ambulatory Visit: Payer: Self-pay | Admitting: Family Medicine

## 2017-11-05 ENCOUNTER — Ambulatory Visit
Admission: RE | Admit: 2017-11-05 | Discharge: 2017-11-05 | Disposition: A | Discharge status: Home / Self Care | Payer: Medicare HMO | Source: Ambulatory Visit | Attending: Family Medicine | Admitting: Family Medicine

## 2017-11-05 DIAGNOSIS — R928 Other abnormal and inconclusive findings on diagnostic imaging of breast: Secondary | ICD-10-CM | POA: Diagnosis not present

## 2017-11-05 DIAGNOSIS — Z1231 Encounter for screening mammogram for malignant neoplasm of breast: Secondary | ICD-10-CM | POA: Diagnosis present

## 2017-11-05 DIAGNOSIS — N6489 Other specified disorders of breast: Secondary | ICD-10-CM

## 2017-11-05 DIAGNOSIS — Z1239 Encounter for other screening for malignant neoplasm of breast: Secondary | ICD-10-CM

## 2017-11-07 ENCOUNTER — Telehealth: Payer: Self-pay

## 2017-11-07 DIAGNOSIS — R32 Unspecified urinary incontinence: Secondary | ICD-10-CM

## 2017-11-07 NOTE — Telephone Encounter (Signed)
Copied from [place]. Topic: Referral - Request >> Nov 07, 2017  2:24 PM Ivar Drape wrote: Reason for CRM:  Patient needs a referral to see a Urologist because her bladder stimulator is not working anymore.   Ok to place referral?

## 2017-11-09 LAB — HEMOGLOBIN A1C: Hemoglobin A1C: 8.9

## 2017-11-09 NOTE — Telephone Encounter (Signed)
Referral placed.

## 2017-11-09 NOTE — Telephone Encounter (Signed)
Yes, please thank you.

## 2017-11-10 LAB — HEMOGLOBIN A1C: HEMOGLOBIN A1C: 8.9

## 2017-11-15 ENCOUNTER — Ambulatory Visit
Admission: RE | Admit: 2017-11-15 | Discharge: 2017-11-15 | Disposition: A | Discharge status: Home / Self Care | Payer: Medicare HMO | Source: Ambulatory Visit | Attending: Family Medicine | Admitting: Family Medicine

## 2017-11-15 DIAGNOSIS — N6489 Other specified disorders of breast: Secondary | ICD-10-CM

## 2017-11-15 DIAGNOSIS — R928 Other abnormal and inconclusive findings on diagnostic imaging of breast: Secondary | ICD-10-CM | POA: Diagnosis present

## 2017-11-26 NOTE — Progress Notes (Signed)
11/27/2017 12:56 PM   [name] 07/06/1966 [id number]  Referring provider: Arnetha Courser, MD [address] [place], [place] [address]  Chief Complaint  Patient presents with  . Urinary Incontinence    lower R back pain, w/ urination incontinence     HPI: Patient is a 52 -year-old Caucasian female who is referred to [name] by Dr. Enid Derry  for urinary incontinence her husband, [name].    Background history [name]: Patient with a history of PVD, diabetes, IC, CKD stage III, bipolar disorder, OAB status post InterStim placement in 2011.  She reports that the device was placed by Dr. Davis Gourd. She reports excellent management of her urinary symptoms of incontinence and frequency since the device was placed until the last few months. She has not had her device interrogated since 2011. She thinks her battery may have died.  She reports she is now experiencing dysuria and right sided groin pain. Also reports multiple daily episodes of urinary incontinence over the last 2 months. Now having to wear a pads daily. Nocturia 2-3 times per night with occasional enuresis. Does wear pull ups at night. No exacerbating or alleviating factors. No fevers, gross hematuria, dysuria or flank pain.  The patient had InterStim in 2011 for urge incontinence and enuresis wearing 4-5 pads a day and right lower quadrant discomfort. She said she was dry and not wearing pads until November.  She currently leaks with coughing sneezing bending and lifting. She now has urge incontinence and enuresis wearing 3 pads a day moderately wet. She'll leak when she goes from a sitting to standing position. Voiding every hour. She gets up 2-3 times a night. She now has her right lower quadrant pain.  She says when she would turn the device up she feels it over the device and not in the vagina.  She's had a hysterectomy and no previous GU surgery.  She was seen by Dr. Matilde Sprang on 10/15/2015 for an impedance  check as she felt that her battery may have died.  A KUB was taken at that visit and the IPG and lead were in good position.   An impedance check confirmed that the device still have adequate battery life.  The device was increased in its intensity and patient had a good sensation.    She returns today stating that the last 6 months she has noted a return of her urinary symptoms.  She also started to have pain in the area of the box 6 months ago.    She is reporting frictional pain during intercourse for the last two years.   She is having associated urinary frequency (q 20 to 30 minutes), urgency (strong), nocturia ( x3) and incontinence (4 to 5 drops with stress).  Patient denies any gross hematuria, dysuria or suprapubic/flank pain.  Patient denies any fevers, chills, nausea or vomiting.  Her UA with 0-5 WBC's.  Her PVR is 32 mL.    She does not have a history of urinary tract infections, STI's or injury to the bladder.   She underwent a "bladder tacking" with Dr. Davis Gourd approximately ten years ago.     She does not have a history of nephrolithiasis.    She is post menopausal.   She denies constipation and/or diarrhea.   KUB taken on 11/27/2017 noted no change in position of bladder stimulator lead overlying the lateral right sacrum.  Moderate amount of feces throughout the colon.  She is drinking one caffeinated beverage daily.  PMH: Past Medical History:  Diagnosis Date  . Asthma   . Bipolar depression ([place])   . CHF (congestive heart failure) ([place])   . Chronic headaches   . COPD (chronic obstructive pulmonary disease) ([place])   . Diabetic neuropathy ([place])   . GERD (gastroesophageal reflux disease)   . Headache   . Heart attack ([place])   . Hyperlipidemia   . Hypertension   . Hypomagnesemia   . Hypothyroid   . Irregular heart rhythm   . Liver lesion    Favored to be benign on CT; MRI of abd w/contract in Aug 2016. Liver Biopsy-Negative, March 2016  . Morbid obesity ([place])    . OSA (obstructive sleep apnea)   . Pinched nerve    Back  . Pneumonia   . RLS (restless legs syndrome)   . Seasonal allergies   . Sleep apnea   . Type 2 diabetes, uncontrolled, with neuropathy ([place]) 10/15/2015   Overview:  Microalbumin 7.7 12/2010: Hgb A1c 10.2% 12/2010 and 9.7% 03/2011, eye exam 10/2009 [facility] DM clinic   . Urinary incontinence   . Vitamin D deficiency     Surgical History: Past Surgical History:  Procedure Laterality Date  . ABDOMINAL HYSTERECTOMY    . ABDOMINAL HYSTERECTOMY     Partial  . BLADDER SURGERY     x 2  . CHOLECYSTECTOMY    . LIVER BIOPSY  March 2016   Negative  . LUNG BIOPSY    . TUBAL LIGATION      Home Medications:  Allergies as of 11/27/2017      Reactions   Morphine And Related    anaphylaxis   Codeine    REACTION: itch, rash   Codeine    itching   Hydrocodone-acetaminophen    REACTION: rash   Mirtazapine    REACTION: closes throat   Morphine And Related    Sulfamethoxazole-trimethoprim    REACTION: Whelps all over   Vicodin [hydrocodone-acetaminophen]    itching      Medication List        Accurate as of 11/27/17 11:59 PM. Always use your most recent med list.          albuterol 108 (90 Base) MCG/ACT inhaler Commonly known as:  PROVENTIL HFA;VENTOLIN HFA Inhale 2 puffs into the lungs every 4 (four) hours as needed for wheezing or shortness of breath.   amLODipine 2.5 MG tablet Commonly known as:  NORVASC Take 2.5 mg by mouth daily.   aspirin EC 81 MG tablet Take 1 tablet (81 mg total) by mouth daily.   atorvastatin 40 MG tablet Commonly known as:  LIPITOR Take 1 tablet (40 mg total) by mouth at bedtime.   budesonide-formoterol 160-4.5 MCG/ACT inhaler Commonly known as:  SYMBICORT Inhale 2 puffs into the lungs 2 (two) times daily.   cholecalciferol 1000 units tablet Commonly known as:  VITAMIN D Take 1,000 Units by mouth daily.   DULoxetine 60 MG capsule Commonly known as:  CYMBALTA Take 120 mg by mouth  daily.   ezetimibe 10 MG tablet Commonly known as:  ZETIA Take 10 mg by mouth daily.   fenofibrate micronized 134 MG capsule Commonly known as:  LOFIBRA   fluticasone 50 MCG/ACT nasal spray Commonly known as:  FLONASE Place 1 spray into both nostrils daily.   gabapentin 300 MG capsule Commonly known as:  NEURONTIN Take 1 capsule (300 mg total) by mouth 2 (two) times daily.   HUMULIN R U-500 KWIKPEN 500 UNIT/ML kwikpen Generic drug:  insulin regular human CONCENTRATED Inject 60 Units as directed 3 (three) times daily.   hydrOXYzine 50 MG capsule Commonly known as:  VISTARIL Take 50 mg by mouth 2 (two) times daily as needed.   insulin aspart 100 UNIT/ML injection Commonly known as:  novoLOG Inject into the skin 3 (three) times daily before meals.   JARDIANCE 25 MG Tabs tablet Generic drug:  empagliflozin Take 25 mg by mouth daily.   lamoTRIgine 25 MG tablet Commonly known as:  LAMICTAL Take 25 mg by mouth 2 (two) times daily.   lamoTRIgine 200 MG tablet Commonly known as:  LAMICTAL Take 200 mg daily by mouth.   levothyroxine 75 MCG tablet Commonly known as:  SYNTHROID, LEVOTHROID Take 1 tablet (75 mcg total) by mouth daily.   linaclotide 72 MCG capsule Commonly known as:  LINZESS Take 1 capsule (72 mcg total) by mouth daily before breakfast.   loratadine 10 MG tablet Commonly known as:  CLARITIN Take 1 tablet (10 mg total) by mouth daily.   magnesium oxide 400 MG tablet Commonly known as:  MAG-OX Take 400 mg by mouth daily.   metoprolol tartrate 25 MG tablet Commonly known as:  LOPRESSOR Take 1 tablet (25 mg total) by mouth 2 (two) times daily.   ONETOUCH VERIO test strip Generic drug:  glucose blood   oseltamivir 75 MG capsule Commonly known as:  TAMIFLU Take 1 capsule (75 mg total) by mouth 2 (two) times daily.   pantoprazole 40 MG tablet Commonly known as:  PROTONIX Take 1 tablet (40 mg total) by mouth daily.   Prasterone 6.5 MG Inst Commonly  known as:  INTRAROSA Place 1 suppository vaginally daily.   QUEtiapine 300 MG tablet Commonly known as:  SEROQUEL Take 300 mg by mouth 2 (two) times daily.   traMADol 50 MG tablet Commonly known as:  ULTRAM Take 1 tablet (50 mg total) by mouth every 6 (six) hours as needed.   traZODone 50 MG tablet Commonly known as:  DESYREL Take 50 mg by mouth 2 (two) times daily.   TRULICITY 1.5 KG/8.1EH Sopn Generic drug:  Dulaglutide 1.5 mg once a week.   ULTICARE SHORT PEN NEEDLES 31G X 8 MM Misc Generic drug:  Insulin Pen Needle       Allergies:  Allergies  Allergen Reactions  . Morphine And Related     anaphylaxis  . Codeine     REACTION: itch, rash  . Codeine     itching  . Hydrocodone-Acetaminophen     REACTION: rash  . Mirtazapine     REACTION: closes throat  . Morphine And Related   . Sulfamethoxazole-Trimethoprim     REACTION: Whelps all over  . Vicodin [Hydrocodone-Acetaminophen]     itching    Family History: Family History  Problem Relation Age of Onset  . Diabetes Mother   . Heart disease Mother   . Hyperlipidemia Mother   . Hypertension Mother   . Kidney disease Mother   . Mental illness Mother   . Hypothyroidism Mother   . Stroke Mother   . Osteoporosis Mother   . Glaucoma Mother   . Congestive Heart Failure Mother   . Hypertension Father   . Asthma Daughter   . Cancer Daughter        throat  . Bipolar disorder Daughter   . Heart disease Maternal Grandfather   . Rheum arthritis Maternal Grandfather   . Cancer Maternal Grandfather        liver  . Kidney  disease Maternal Grandfather   . Cancer Paternal Grandmother        lung  . Kidney disease Brother   . Breast cancer Maternal Grandmother 60  . Bipolar disorder Daughter   . Hypertension Daughter   . Restless legs syndrome Daughter     Social History:  reports that she has never smoked. She has never used smokeless tobacco. She reports that she does not drink alcohol or use  drugs.  ROS: UROLOGY Frequent Urination?: Yes Hard to postpone urination?: Yes Burning/pain with urination?: No Get up at night to urinate?: Yes Leakage of urine?: Yes Urine stream starts and stops?: No Trouble starting stream?: No Do you have to strain to urinate?: No Blood in urine?: No Urinary tract infection?: Yes Sexually transmitted disease?: No Injury to kidneys or bladder?: Yes Painful intercourse?: Yes Weak stream?: No Currently pregnant?: No Vaginal bleeding?: No Last menstrual period?: hysterectomy  Gastrointestinal Nausea?: Yes Vomiting?: No Indigestion/heartburn?: Yes Diarrhea?: No Constipation?: No  Constitutional Fever: No Night sweats?: No Weight loss?: Yes Fatigue?: Yes  Skin Skin rash/lesions?: Yes Itching?: Yes  Eyes Blurred vision?: No Double vision?: No  Ears/Nose/Throat Sore throat?: No Sinus problems?: Yes  Hematologic/Lymphatic Swollen glands?: No Easy bruising?: No  Cardiovascular Leg swelling?: No Chest pain?: No  Respiratory Cough?: No Shortness of breath?: No  Endocrine Excessive thirst?: No  Musculoskeletal Back pain?: Yes Joint pain?: No  Neurological Headaches?: Yes Dizziness?: Yes  Psychologic Depression?: Yes Anxiety?: Yes  Physical Exam: BP 135/86   Pulse 90   Ht 5' (1.524 m)   Wt 191 lb (86.6 kg)   BMI 37.30 kg/m   Constitutional: Well nourished. Alert and oriented, No acute distress. HEENT: [place] AT, moist mucus membranes. Trachea midline, no masses. Cardiovascular: No clubbing, cyanosis, or edema. Respiratory: Normal respiratory effort, no increased work of breathing. GI: Abdomen is soft, non tender, non distended, no abdominal masses. Liver and spleen not palpable.  No hernias appreciated.  Stool sample for occult testing is not indicated.   GU: No CVA tenderness.  No bladder fullness or masses.  Atrophic external genitalia, normal pubic hair distribution, no lesions.  Normal urethral meatus, no  lesions, no prolapse, no discharge.   No urethral masses, tenderness and/or tenderness. No bladder fullness, tenderness or masses. Pale vagina mucosa, poor estrogen effect, no discharge, no lesions, good pelvic support, no cystocele or rectocele noted.  Cervix and uterus are surgically absent.  No adnexal/parametria masses or tenderness noted.  Anus and perineum are without rashes or lesions.    Skin: No rashes, bruises or suspicious lesions. Lymph: No cervical or inguinal adenopathy. Neurologic: Grossly intact, no focal deficits, moving all 4 extremities. Psychiatric: Normal mood and affect.  Laboratory Data: Lab Results  Component Value Date   WBC 8.8 12/04/2017   HGB 14.6 12/04/2017   HCT 45.9 12/04/2017   MCV 83.9 12/04/2017   PLT 366 12/04/2017    Lab Results  Component Value Date   CREATININE 1.33 (H) 12/04/2017    No results found for: PSA  No results found for: TESTOSTERONE  Lab Results  Component Value Date   HGBA1C 8.9 11/10/2017    Lab Results  Component Value Date   TSH 0.94 09/10/2017       Component Value Date/Time   CHOL 196 09/28/2016 1456   CHOL 196 09/22/2015 1339   CHOL 141 10/25/2013 0314   HDL 33 (L) 09/28/2016 1456   HDL 35 (L) 09/22/2015 1339   HDL 9 (L) 10/25/2013 9678  CHOLHDL 5.9 (H) 09/28/2016 1456   VLDL NOT CALC 09/28/2016 1456   VLDL SEE COMMENT 10/25/2013 0314   LDLCALC NOT CALC 09/28/2016 1456   LDLCALC 99 09/22/2015 1339   LDLCALC SEE COMMENT 10/25/2013 0314    Lab Results  Component Value Date   AST 17 09/10/2017   Lab Results  Component Value Date   ALT 17 09/10/2017   No components found for: ALKALINEPHOPHATASE No components found for: BILIRUBINTOTAL  No results found for: ESTRADIOL  Urinalysis 0-5 WBC's.  See Epic.    I have reviewed the labs.   Pertinent Imaging: Results for ADAHLIA, STEMBRIDGE (MRN [id number]) as of 12/06/2017 10:08  Ref. Range 11/27/2017 10:58  Scan Result Unknown 32   I have independently  reviewed the films.    Assessment & Plan:    1. Urgency Inter stim impedence was checked and battery was found to be dead  Patient would like to be considered for another Inter stim placement at this time Will schedule an appointment with Dr. Matilde Sprang   2. Urge incontinence Began recently and hopefully will resolve if patient receives a new Inter stim  3.  Dyspareunia Given a sample of Intrarosa 6.5 mg vaginal inserts to insert daily  Script sent to pharmacy - she will report results  Return for appointment with Dr. Matilde Sprang .  These notes generated with voice recognition software. I apologize for typographical errors.  Zara Council, [facility] [address], [place], [place] [address] [phone]

## 2017-11-27 ENCOUNTER — Other Ambulatory Visit: Payer: Self-pay

## 2017-11-27 ENCOUNTER — Ambulatory Visit
Admission: RE | Admit: 2017-11-27 | Discharge: 2017-11-27 | Disposition: A | Discharge status: Home / Self Care | Payer: Medicare HMO | Source: Ambulatory Visit | Attending: Urology | Admitting: Urology

## 2017-11-27 ENCOUNTER — Encounter: Payer: Self-pay | Admitting: Urology

## 2017-11-27 ENCOUNTER — Ambulatory Visit (INDEPENDENT_AMBULATORY_CARE_PROVIDER_SITE_OTHER): Payer: Medicare HMO | Admitting: Urology

## 2017-11-27 VITALS — BP 135/86 | HR 90 | Ht 60.0 in | Wt 191.0 lb

## 2017-11-27 DIAGNOSIS — N3946 Mixed incontinence: Secondary | ICD-10-CM | POA: Insufficient documentation

## 2017-11-27 DIAGNOSIS — N3941 Urge incontinence: Secondary | ICD-10-CM

## 2017-11-27 DIAGNOSIS — N941 Unspecified dyspareunia: Secondary | ICD-10-CM

## 2017-11-27 DIAGNOSIS — R3915 Urgency of urination: Secondary | ICD-10-CM | POA: Diagnosis not present

## 2017-11-27 LAB — URINALYSIS, COMPLETE
BILIRUBIN UA: NEGATIVE
GLUCOSE, UA: NEGATIVE
Leukocytes, UA: NEGATIVE
NITRITE UA: NEGATIVE
Protein, UA: NEGATIVE
RBC UA: NEGATIVE
SPEC GRAV UA: 1.01 (ref 1.005–1.030)
UUROB: 0.2 mg/dL (ref 0.2–1.0)
pH, UA: 5.5 (ref 5.0–7.5)

## 2017-11-27 LAB — MICROSCOPIC EXAMINATION
Bacteria, UA: NONE SEEN
RBC MICROSCOPIC, UA: NONE SEEN /HPF (ref 0–2)

## 2017-11-27 LAB — BLADDER SCAN AMB NON-IMAGING: SCAN RESULT: 32

## 2017-11-27 MED ORDER — PRASTERONE 6.5 MG VA INST: 1.0000 | VAGINAL_INSERT | Freq: Every day | VAGINAL | 12 refills | Status: DC

## 2017-11-28 ENCOUNTER — Ambulatory Visit (INDEPENDENT_AMBULATORY_CARE_PROVIDER_SITE_OTHER): Payer: Medicare HMO | Admitting: Family Medicine

## 2017-11-28 ENCOUNTER — Encounter: Payer: Self-pay | Admitting: Family Medicine

## 2017-11-28 ENCOUNTER — Telehealth: Payer: Self-pay

## 2017-11-28 VITALS — BP 124/80 | HR 98 | Temp 98.1°F | Resp 14 | Ht 59.5 in | Wt 190.4 lb

## 2017-11-28 DIAGNOSIS — E1140 Type 2 diabetes mellitus with diabetic neuropathy, unspecified: Secondary | ICD-10-CM | POA: Diagnosis not present

## 2017-11-28 DIAGNOSIS — E1165 Type 2 diabetes mellitus with hyperglycemia: Secondary | ICD-10-CM | POA: Diagnosis not present

## 2017-11-28 DIAGNOSIS — IMO0002 Reserved for concepts with insufficient information to code with codable children: Secondary | ICD-10-CM

## 2017-11-28 DIAGNOSIS — J0101 Acute recurrent maxillary sinusitis: Secondary | ICD-10-CM

## 2017-11-28 MED ORDER — LEVOFLOXACIN 500 MG PO TABS: 500.0000 mg | ORAL_TABLET | Freq: Every day | ORAL | 0 refills | Status: DC

## 2017-11-28 NOTE — Telephone Encounter (Signed)
Patient notified on vmail

## 2017-11-28 NOTE — Patient Instructions (Signed)
Let's start the antibiotics Please do eat yogurt or kimchi or take a probiotic daily for the next month We want to replace the healthy germs in the gut If you notice foul, watery diarrhea in the next two months, schedule an appointment RIGHT AWAY or go to an urgent care or the emergency room if a holiday or over a weekend If not improving, then let me know

## 2017-11-28 NOTE — Progress Notes (Signed)
BP 124/80   Pulse 98   Temp 98.1 F (36.7 C) (Oral)   Resp 14   Ht 4' 11.5" (1.511 m)   Wt 190 lb 6.4 oz (86.4 kg)   SpO2 95%   BMI 37.81 kg/m    Subjective:    Patient ID: [name], female    DOB: December 03, 1965, 52 y.o.   MRN: [id number]  HPI: [name] is a 51 y.o. female  Chief Complaint  Patient presents with  . Sinusitis    onset 2 days.  Symptoms include headache, ear pain left, and headache    HPI Patient is here for another sinus infection; pain over the right eye, hurts to lay on it; left ear is bothersome No fevers, no hospital visits Two rounds of doxy and was well for five days then got it again Unable to return to ENT Last May, head CT showed sinuses were clear  Skin outbreak, one on the left hip for a year; picks at it; sores on the right shoulder, glabella, abdomen; won't heal  Depression screen [facility] 2/9 11/28/2017 10/30/2017 10/09/2017 09/21/2017 08/22/2017  Decreased Interest 0 0 0 0 0  Down, Depressed, Hopeless 1 0 0 0 0  PHQ - 2 Score 1 0 0 0 0  Some recent data might be hidden    Relevant past medical, surgical, family and social history reviewed Past Medical History:  Diagnosis Date  . Asthma   . Bipolar depression ([place])   . CHF (congestive heart failure) ([place])   . Chronic headaches   . COPD (chronic obstructive pulmonary disease) ([place])   . Diabetic neuropathy ([place])   . GERD (gastroesophageal reflux disease)   . Headache   . Heart attack ([place])   . Hyperlipidemia   . Hypertension   . Hypomagnesemia   . Hypothyroid   . Irregular heart rhythm   . Liver lesion    Favored to be benign on CT; MRI of abd w/contract in Aug 2016. Liver Biopsy-Negative, March 2016  . Morbid obesity ([place])   . OSA (obstructive sleep apnea)   . Pinched nerve    Back  . Pneumonia   . RLS (restless legs syndrome)   . Seasonal allergies   . Sleep apnea   . Type 2 diabetes, uncontrolled, with neuropathy ([place]) 10/15/2015   Overview:  Microalbumin 7.7 12/2010:  Hgb A1c 10.2% 12/2010 and 9.7% 03/2011, eye exam 10/2009 [facility] DM clinic   . Urinary incontinence   . Vitamin D deficiency    Past Surgical History:  Procedure Laterality Date  . ABDOMINAL HYSTERECTOMY    . ABDOMINAL HYSTERECTOMY     Partial  . BLADDER SURGERY     x 2  . CHOLECYSTECTOMY    . LIVER BIOPSY  March 2016   Negative  . LUNG BIOPSY    . TUBAL LIGATION     Family History  Problem Relation Age of Onset  . Diabetes Mother   . Heart disease Mother   . Hyperlipidemia Mother   . Hypertension Mother   . Kidney disease Mother   . Mental illness Mother   . Hypothyroidism Mother   . Stroke Mother   . Osteoporosis Mother   . Glaucoma Mother   . Congestive Heart Failure Mother   . Hypertension Father   . Asthma Daughter   . Cancer Daughter        throat  . Bipolar disorder Daughter   . Heart disease Maternal Grandfather   . Rheum  arthritis Maternal Grandfather   . Cancer Maternal Grandfather        liver  . Kidney disease Maternal Grandfather   . Cancer Paternal Grandmother        lung  . Kidney disease Brother   . Breast cancer Maternal Grandmother 60  . Bipolar disorder Daughter   . Hypertension Daughter   . Restless legs syndrome Daughter    Social History   Tobacco Use  . Smoking status: Never Smoker  . Smokeless tobacco: Never Used  Substance Use Topics  . Alcohol use: No  . Drug use: No    Interim medical history since last visit reviewed. Allergies and medications reviewed  Review of Systems Per HPI unless specifically indicated above     Objective:    BP 124/80   Pulse 98   Temp 98.1 F (36.7 C) (Oral)   Resp 14   Ht 4' 11.5" (1.511 m)   Wt 190 lb 6.4 oz (86.4 kg)   SpO2 95%   BMI 37.81 kg/m   Wt Readings from Last 3 Encounters:  11/28/17 190 lb 6.4 oz (86.4 kg)  11/27/17 191 lb (86.6 kg)  10/30/17 192 lb 4.8 oz (87.2 kg)    Physical Exam  Constitutional: She appears well-developed and well-nourished.  HENT:  Head: Normocephalic  and atraumatic.  Nose: Mucosal edema and rhinorrhea present. Right sinus exhibits maxillary sinus tenderness and frontal sinus tenderness. Left sinus exhibits maxillary sinus tenderness and frontal sinus tenderness.  Mouth/Throat: Mucous membranes are not dry. No posterior oropharyngeal erythema.  Cardiovascular: Normal rate and regular rhythm.  Pulmonary/Chest: Effort normal and breath sounds normal.  Lymphadenopathy:    She has no cervical adenopathy.  Skin: Lesion (multiple excporiated lesions, left hip, right ear, scattered elsewhere) noted. She is not diaphoretic.   Diabetic Foot Form - Detailed   Diabetic Foot Exam - detailed Diabetic Foot exam was performed with the following findings:  Yes 11/28/2017  7:52 PM  Visual Foot Exam completed.:  Yes  Pulse Foot Exam completed.:  Yes  Right Dorsalis Pedis:  Present Left Dorsalis Pedis:  Present  Sensory Foot Exam Completed.:  Yes Semmes-Weinstein Monofilament Test       Results for orders placed or performed in visit on 11/28/17  Hemoglobin A1c  Result Value Ref Range   Hemoglobin A1C 8.9       Assessment & Plan:   Problem List Items Addressed This Visit      Endocrine   Type 2 diabetes, uncontrolled, with neuropathy ([place])    Managed by endo; will not treat with steroids       Other Visit Diagnoses    Acute recurrent maxillary sinusitis    -  Primary   round of antibiotics; she cannot return to local ENT b/c of bill; if not improving, then sinus CT   Relevant Medications   levofloxacin (LEVAQUIN) 500 MG tablet       Follow up plan: No follow-ups on file.  An after-visit summary was printed and given to the patient at [facility].  Please see the patient instructions which may contain other information and recommendations beyond what is mentioned above in the assessment and plan.  Meds ordered this encounter  Medications  . levofloxacin (LEVAQUIN) 500 MG tablet    Sig: Take 1 tablet (500 mg total) by mouth daily.     Dispense:  10 tablet    Refill:  0    No orders of the defined types were placed in this encounter.

## 2017-11-28 NOTE — Telephone Encounter (Signed)
-----   Message from Garnette Gunner, [place] sent at 11/28/2017  9:46 AM EDT -----   ----- Message ----- From: Nori Riis, PA-C Sent: 11/28/2017   7:50 AM To: [facility]  Please let Mrs. Cruey know that her Interstim has not changed in position.

## 2017-12-02 NOTE — Assessment & Plan Note (Signed)
Managed by endo; will not treat with steroids

## 2017-12-04 ENCOUNTER — Encounter: Payer: Self-pay | Admitting: Family Medicine

## 2017-12-04 ENCOUNTER — Encounter: Payer: Self-pay | Admitting: Medical Oncology

## 2017-12-04 ENCOUNTER — Emergency Department
Admission: EM | Admit: 2017-12-04 | Discharge: 2017-12-04 | Disposition: A | Discharge status: Home / Self Care | Payer: Medicare HMO | Attending: Emergency Medicine | Admitting: Emergency Medicine

## 2017-12-04 DIAGNOSIS — N289 Disorder of kidney and ureter, unspecified: Secondary | ICD-10-CM | POA: Diagnosis not present

## 2017-12-04 DIAGNOSIS — Z79899 Other long term (current) drug therapy: Secondary | ICD-10-CM | POA: Insufficient documentation

## 2017-12-04 DIAGNOSIS — E1165 Type 2 diabetes mellitus with hyperglycemia: Secondary | ICD-10-CM | POA: Diagnosis not present

## 2017-12-04 DIAGNOSIS — Z9049 Acquired absence of other specified parts of digestive tract: Secondary | ICD-10-CM | POA: Insufficient documentation

## 2017-12-04 DIAGNOSIS — Z794 Long term (current) use of insulin: Secondary | ICD-10-CM | POA: Insufficient documentation

## 2017-12-04 DIAGNOSIS — E039 Hypothyroidism, unspecified: Secondary | ICD-10-CM | POA: Diagnosis not present

## 2017-12-04 DIAGNOSIS — I2510 Atherosclerotic heart disease of native coronary artery without angina pectoris: Secondary | ICD-10-CM | POA: Diagnosis not present

## 2017-12-04 DIAGNOSIS — N183 Chronic kidney disease, stage 3 (moderate): Secondary | ICD-10-CM | POA: Insufficient documentation

## 2017-12-04 DIAGNOSIS — G43801 Other migraine, not intractable, with status migrainosus: Secondary | ICD-10-CM

## 2017-12-04 DIAGNOSIS — R739 Hyperglycemia, unspecified: Secondary | ICD-10-CM

## 2017-12-04 DIAGNOSIS — F329 Major depressive disorder, single episode, unspecified: Secondary | ICD-10-CM | POA: Diagnosis not present

## 2017-12-04 DIAGNOSIS — E860 Dehydration: Secondary | ICD-10-CM | POA: Diagnosis not present

## 2017-12-04 DIAGNOSIS — E1122 Type 2 diabetes mellitus with diabetic chronic kidney disease: Secondary | ICD-10-CM | POA: Diagnosis not present

## 2017-12-04 DIAGNOSIS — I130 Hypertensive heart and chronic kidney disease with heart failure and stage 1 through stage 4 chronic kidney disease, or unspecified chronic kidney disease: Secondary | ICD-10-CM | POA: Diagnosis not present

## 2017-12-04 DIAGNOSIS — J45909 Unspecified asthma, uncomplicated: Secondary | ICD-10-CM | POA: Insufficient documentation

## 2017-12-04 DIAGNOSIS — R51 Headache: Secondary | ICD-10-CM | POA: Diagnosis present

## 2017-12-04 DIAGNOSIS — I509 Heart failure, unspecified: Secondary | ICD-10-CM | POA: Diagnosis not present

## 2017-12-04 DIAGNOSIS — Z7982 Long term (current) use of aspirin: Secondary | ICD-10-CM | POA: Diagnosis not present

## 2017-12-04 LAB — URINALYSIS, COMPLETE (UACMP) WITH MICROSCOPIC
BACTERIA UA: NONE SEEN
BILIRUBIN URINE: NEGATIVE
Hgb urine dipstick: NEGATIVE
KETONES UR: 80 mg/dL — AB
Leukocytes, UA: NEGATIVE
NITRITE: NEGATIVE
PH: 5 (ref 5.0–8.0)
PROTEIN: NEGATIVE mg/dL
Specific Gravity, Urine: 1.03 (ref 1.005–1.030)

## 2017-12-04 LAB — BLOOD GAS, VENOUS
Acid-base deficit: 4.5 mmol/L — ABNORMAL HIGH (ref 0.0–2.0)
Bicarbonate: 19.2 mmol/L — ABNORMAL LOW (ref 20.0–28.0)
O2 SAT: 92.4 %
PATIENT TEMPERATURE: 37
pCO2, Ven: 29 mmHg — ABNORMAL LOW (ref 44.0–60.0)
pH, Ven: 7.43 (ref 7.250–7.430)
pO2, Ven: 63 mmHg — ABNORMAL HIGH (ref 32.0–45.0)

## 2017-12-04 LAB — BASIC METABOLIC PANEL
Anion gap: 22 — ABNORMAL HIGH (ref 5–15)
Anion gap: 16 — ABNORMAL HIGH (ref 5–15)
BUN: 26 mg/dL — ABNORMAL HIGH (ref 6–20)
BUN: 29 mg/dL — AB (ref 6–20)
CALCIUM: 9 mg/dL (ref 8.9–10.3)
CHLORIDE: 98 mmol/L — AB (ref 101–111)
CO2: 21 mmol/L — AB (ref 22–32)
CO2: 20 mmol/L — ABNORMAL LOW (ref 22–32)
CREATININE: 1.44 mg/dL — AB (ref 0.44–1.00)
CREATININE: 1.33 mg/dL — AB (ref 0.44–1.00)
Calcium: 10.3 mg/dL (ref 8.9–10.3)
Chloride: 107 mmol/L (ref 101–111)
GFR calc Af Amer: 47 mL/min — ABNORMAL LOW (ref >60)
GFR calc non Af Amer: 40 mL/min — ABNORMAL LOW (ref >60)
GFR, EST AFRICAN AMERICAN: 51 mL/min — AB (ref >60)
GFR, EST NON AFRICAN AMERICAN: 44 mL/min — AB (ref >60)
GLUCOSE: 201 mg/dL — AB (ref 65–99)
Glucose, Bld: 180 mg/dL — ABNORMAL HIGH (ref 65–99)
Potassium: 4.5 mmol/L (ref 3.5–5.1)
Potassium: 3.7 mmol/L (ref 3.5–5.1)
Sodium: 141 mmol/L (ref 135–145)
Sodium: 143 mmol/L (ref 135–145)

## 2017-12-04 LAB — CBC
HEMATOCRIT: 45.9 % (ref 35.0–47.0)
HEMOGLOBIN: 14.6 g/dL (ref 12.0–16.0)
MCH: 26.8 pg (ref 26.0–34.0)
MCHC: 31.9 g/dL — AB (ref 32.0–36.0)
MCV: 83.9 fL (ref 80.0–100.0)
Platelets: 366 10*3/uL (ref 150–440)
RBC: 5.47 MIL/uL — ABNORMAL HIGH (ref 3.80–5.20)
RDW: 15.8 % — AB (ref 11.5–14.5)
WBC: 8.8 10*3/uL (ref 3.6–11.0)

## 2017-12-04 LAB — SEDIMENTATION RATE: Sed Rate: 4 mm/hr (ref 0–30)

## 2017-12-04 MED ORDER — SODIUM CHLORIDE 0.9 % IV BOLUS
1000.0000 mL | Freq: Once | INTRAVENOUS | Status: AC
  Administered 2017-12-04: 1000 mL via INTRAVENOUS

## 2017-12-04 MED ORDER — PROCHLORPERAZINE EDISYLATE 5 MG/ML IJ SOLN
10.0000 mg | Freq: Once | INTRAMUSCULAR | Status: AC
  Administered 2017-12-04: 10 mg via INTRAVENOUS

## 2017-12-04 MED ORDER — PROCHLORPERAZINE EDISYLATE 5 MG/ML IJ SOLN
INTRAMUSCULAR | Status: AC
  Administered 2017-12-04: 10 mg via INTRAVENOUS
  Filled 2017-12-04: qty 2

## 2017-12-04 MED ORDER — PROCHLORPERAZINE MALEATE 5 MG PO TABS: 5.0000 mg | ORAL_TABLET | Freq: Four times a day (QID) | ORAL | 0 refills | Status: DC | PRN

## 2017-12-04 MED ORDER — KETOROLAC TROMETHAMINE 30 MG/ML IJ SOLN
30.0000 mg | Freq: Once | INTRAMUSCULAR | Status: AC
  Administered 2017-12-04: 30 mg via INTRAVENOUS
  Filled 2017-12-04: qty 1

## 2017-12-04 MED ORDER — IBUPROFEN 600 MG PO TABS: 600.0000 mg | ORAL_TABLET | Freq: Three times a day (TID) | ORAL | 0 refills | Status: DC | PRN

## 2017-12-04 NOTE — ED Provider Notes (Signed)
[facility] Emergency Department Provider Note  ____________________________________________  Time seen: Approximately 2:15 PM  I have reviewed the triage vital signs and the nursing notes.   HISTORY  Chief Complaint Headache    HPI [name] is a 52 y.o. female with a history of chronic headaches and migraines, bipolar disorder, HTN, HL, DM, presenting with a left-sided headache and nausea.  The patient reports that for the last several weeks, she has had an aching sensation behind the left orbit, left temple and including the left ear associated with nausea but no vomiting.  Over the past 2 days she has had decreased p.o. intake.  Patient states she has a history of migraines, and although she has not had a migraine for several years, her symptoms today are similar to prior migraines.  Her PCP has tried her on a course of doxycycline and now she is on Levaquin for presumed sinusitis.  The patient denies any congestion or rhinorrhea, cough, fevers or chills, postnasal drip or facial pressure.  He denies any abdominal pain, constipation or diarrhea.  Does have poor dentition and some right lower jaw tooth pain.  Patient has tried [facility] powder, Tylenol, and Tylenol PM without any improvement.    Past Medical History:  Diagnosis Date  . Asthma   . Bipolar depression ([place])   . CHF (congestive heart failure) ([place])   . Chronic headaches   . COPD (chronic obstructive pulmonary disease) ([place])   . Diabetic neuropathy ([place])   . GERD (gastroesophageal reflux disease)   . Headache   . Heart attack ([place])   . Hyperlipidemia   . Hypertension   . Hypomagnesemia   . Hypothyroid   . Irregular heart rhythm   . Liver lesion    Favored to be benign on CT; MRI of abd w/contract in Aug 2016. Liver Biopsy-Negative, March 2016  . Morbid obesity ([place])   . OSA (obstructive sleep apnea)   . Pinched nerve    Back  . Pneumonia   . RLS (restless legs syndrome)   . Seasonal  allergies   . Sleep apnea   . Type 2 diabetes, uncontrolled, with neuropathy ([place]) 10/15/2015   Overview:  Microalbumin 7.7 12/2010: Hgb A1c 10.2% 12/2010 and 9.7% 03/2011, eye exam 10/2009 [facility] DM clinic   . Urinary incontinence   . Vitamin D deficiency     Patient Active Problem List   Diagnosis Date Noted  . Encounter for Medicare annual wellness exam 10/30/2017  . Patient is full code 10/30/2017  . Disorder of bursae of shoulder region 10/09/2017  . Coronary artery disease 10/09/2017  . Plantar fascial fibromatosis 10/09/2017  . Tendinitis of wrist 10/09/2017  . Schizoaffective disorder, bipolar type ([place]) 09/28/2017  . Chest pain 09/10/2017  . Lobar pneumonia ([place]) 06/05/2017  . Multifocal pneumonia 05/27/2017  . Pleuritic chest pain 05/27/2017  . Hypertriglyceridemia 12/21/2016  . Pansinusitis 11/15/2016  . 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 Breast cancer screening 09/28/2016  . Urine frequency 09/28/2016  . Preventative health care 09/28/2016  . Fatigue 05/01/2016  . Urinary tract infection 03/14/2016  . Adenomatous colon polyp 10/15/2015  . Airway hyperreactivity 10/15/2015  . Bipolar affective disorder ([place]) 10/15/2015  . CN (constipation) 10/15/2015  . CAFL (chronic airflow limitation) ([place]) 10/15/2015  . Drug-induced hepatic toxicity 10/15/2015  . Blood in feces 10/15/2015  . HLD (hyperlipidemia) 10/15/2015  . Hypoxemia 10/15/2015  . NASH (nonalcoholic steatohepatitis) 10/15/2015  . Morbid obesity ([place])  10/15/2015  . Restless leg syndrome 10/15/2015  . Type 2 diabetes, uncontrolled, with neuropathy ([place]) 10/15/2015  . Elevated alkaline phosphatase level 09/22/2015  . Urinary bladder incontinence 09/22/2015  . Low back pain with left-sided sciatica 08/18/2015  . Encounter for monitoring tricyclic antidepressant therapy 05/13/2015  . Bipolar disorder ([place]) 05/11/2015  . Liver disease 05/11/2015  . Apnea, sleep 05/05/2015  . Heart valve  disease 05/05/2015  . Chronic kidney disease, stage III (moderate) (HCC) 03/15/2015  . Migraine 03/12/2015  . COPD (chronic obstructive pulmonary disease) ([place]) 01/20/2015  . 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 GERD (gastroesophageal reflux disease) 01/20/2015  . Hypothyroid   . Vitamin D deficiency   . Urinary incontinence   . Liver lesion   . Hypomagnesemia   . Obstructive sleep apnea 10/20/2013  . Vaginitis 12/19/2011  . PERIPHERAL NEUROPATHY 03/04/2009  . HEMORRHOIDS, EXTERNAL 09/03/2008  . ALLERGIC RHINITIS 07/04/2007  . ASTHMA, PERSISTENT, MODERATE 07/04/2007  . INTERSTITIAL CYSTITIS 07/04/2007  . HYPERTENSION, BENIGN ESSENTIAL 06/19/2007    Past Surgical History:  Procedure Laterality Date  . ABDOMINAL HYSTERECTOMY    . ABDOMINAL HYSTERECTOMY     Partial  . BLADDER SURGERY     x 2  . CHOLECYSTECTOMY    . LIVER BIOPSY  March 2016   Negative  . LUNG BIOPSY    . TUBAL LIGATION      Current Outpatient Rx  . Order #: [id number] Class: Historical Med  . Order #: [id number] Class: Historical Med  . Order #: [id number] Class: Historical Med  . Order #: [id number] Class: Normal  . Order #: [id number] Class: Historical Med  . Order #: [id number] Class: Historical Med  . Order #: [id number] Class: Historical Med  . Order #: [id number] Class: Historical Med  . Order #: [id number] Class: Historical Med  . Order #: [id number] Class: Historical Med  . Order #: [id number] Class: Historical Med  . Order #: [id number] Class: Normal  . Order #: [id number] Class: Historical Med  . Order #: [id number] Class: Historical Med  . Order #: [id number] Class: Print  . Order #: [id number] Class: Historical Med  . Order #: [id number] Class: Historical Med  . Order #: [id number] Class: Historical Med  . Order #: [id number] Class: Historical Med  . Order #: [id number] Class: Normal  . Order #: [id number] Class: Normal  . Order #: [id number] Class: Normal  . Order #: [id number] Class: Normal  . Order #:  [id number] Class: Historical Med  . Order #: [id number] Class: Normal  . Order #: [id number] Class: Historical Med  . Order #: [id number] Class: Normal  . Order #: [id number] Class: Normal  . Order #: [id number] Class: Normal  . Order #: [id number] Class: Print  . Order #: [id number] Class: Historical Med  . Order #: [id number] Class: Print  . Order #: [id number] Class: Historical Med  . Order #: [id number] Class: Historical Med  . Order #: [id number] Class: Historical Med    Allergies Morphine and related; Codeine; Codeine; Hydrocodone-acetaminophen; Mirtazapine; Morphine and related; Sulfamethoxazole-trimethoprim; and Vicodin [hydrocodone-acetaminophen]  Family History  Problem Relation Age of Onset  . Diabetes Mother   . Heart disease Mother   . Hyperlipidemia Mother   . Hypertension Mother   . Kidney disease Mother   . Mental illness Mother   . Hypothyroidism Mother   . Stroke Mother   . Osteoporosis Mother   . Glaucoma Mother   . Congestive Heart Failure Mother   . Hypertension Father   . Asthma Daughter   . Cancer Daughter        throat  . Bipolar disorder Daughter   . Heart disease Maternal Grandfather   .  Rheum arthritis Maternal Grandfather   . Cancer Maternal Grandfather        liver  . Kidney disease Maternal Grandfather   . Cancer Paternal Grandmother        lung  . Kidney disease Brother   . Breast cancer Maternal Grandmother 60  . Bipolar disorder Daughter   . Hypertension Daughter   . Restless legs syndrome Daughter     Social History Social History   Tobacco Use  . Smoking status: Never Smoker  . Smokeless tobacco: Never Used  Substance Use Topics  . Alcohol use: No  . Drug use: No    Review of Systems Constitutional: No fever/chills.  Headedness or syncope.  Positive general malaise. Eyes: Positive bilateral intermittent blurred vision. ENT: No sore throat. No congestion or rhinorrhea.  No postnasal drip. Cardiovascular: Denies chest pain. Denies  palpitations. Respiratory: Denies shortness of breath.  No cough. Gastrointestinal: No abdominal pain.  Positive nausea, no vomiting.  No diarrhea.  No constipation. Genitourinary: Negative for dysuria. Musculoskeletal: Negative for back pain. Skin: Negative for rash. Neurological: Positive for left-sided headaches. No focal numbness, tingling or weakness.  Positive blurred vision that is intermittent.  No diplopia.  No changes in speech or mental status.    ____________________________________________   PHYSICAL EXAM:  VITAL SIGNS: ED Triage Vitals [12/04/17 1122]  Enc Vitals Group     BP 111/81     Pulse Rate (!) 112     Resp 18     Temp 98.4 F (36.9 C)     Temp Source Oral     SpO2 96 %     Weight 190 lb (86.2 kg)     Height 4' 11"  (1.499 m)     Head Circumference      Peak Flow      Pain Score 9     Pain Loc      Pain Edu?      Excl. in [place]?     Constitutional: Alert and oriented.  Phonically ill appearing but in no acute distress. Answers questions appropriately. Eyes: Conjunctivae are normal.  EOMI. PERRLA.  Full range of motion without pain.  No scleral icterus. Head: Atraumatic.  Positive tenderness to palpation over the left temple without any overlying swelling.  The patient has no tenderness to palpation over the frontal or maxillary sinuses.  She has no submandibular lymphadenopathy. EARS: TMs are clear without fluid, bulge or erythema bilaterally.  The canals are clear as well. Nose: No congestion/rhinnorhea. Mouth/Throat: Mucous membranes are mildly dry.  The patient has poor dentition throughout and a right lower tooth which is out of place but no evidence of abscess.  The posterior pharynx is without erythema.  No tonsillar swelling or exudate.  The posterior palate is symmetric and the uvula is midline.  No stridor or drooling.  Neck: No stridor.  Supple.  No JVD.  No meningismus. Cardiovascular: Fast rate, regular rhythm. No murmurs, rubs or gallops.   Respiratory: Normal respiratory effort.  No accessory muscle use or retractions. Lungs CTAB.  No wheezes, rales or ronchi. Gastrointestinal: Obese.  Soft, nontender and nondistended.  No guarding or rebound.  No peritoneal signs. Musculoskeletal: No LE edema.  Neurologic:  A&Ox3.  Speech is clear.  Face and smile are symmetric.  EOMI.  Moves all extremities well. Skin:  Skin is warm, dry and intact. No rash noted. Psychiatric: Mood and affect are normal. _____________________________   LABS (all labs ordered are listed, but only abnormal results  are displayed)  Labs Reviewed  CBC - Abnormal; Notable for the following components:      Result Value   RBC 5.47 (*)    MCHC 31.9 (*)    RDW 15.8 (*)    All other components within normal limits  BASIC METABOLIC PANEL - Abnormal; Notable for the following components:   Chloride 98 (*)    CO2 21 (*)    Glucose, Bld 201 (*)    BUN 26 (*)    Creatinine, Ser 1.44 (*)    GFR calc non Af Amer 40 (*)    GFR calc Af Amer 47 (*)    Anion gap 22 (*)    All other components within normal limits  URINALYSIS, COMPLETE (UACMP) WITH MICROSCOPIC - Abnormal; Notable for the following components:   Color, Urine YELLOW (*)    APPearance CLEAR (*)    Glucose, UA >=500 (*)    Ketones, ur 80 (*)    Squamous Epithelial / LPF 0-5 (*)    All other components within normal limits  BLOOD GAS, VENOUS - Abnormal; Notable for the following components:   pCO2, Ven 29 (*)    pO2, Ven 63.0 (*)    Bicarbonate 19.2 (*)    Acid-base deficit 4.5 (*)    All other components within normal limits  BASIC METABOLIC PANEL - Abnormal; Notable for the following components:   CO2 20 (*)    Glucose, Bld 180 (*)    BUN 29 (*)    Creatinine, Ser 1.33 (*)    GFR calc non Af Amer 44 (*)    GFR calc Af Amer 51 (*)    Anion gap 16 (*)    All other components within normal limits  SEDIMENTATION RATE   ____________________________________________  EKG  ED ECG REPORT I,  Eula Listen, the attending physician, personally viewed and interpreted this ECG.   Date: 12/04/2017  EKG Time: 1425  Rate: 108  Rhythm: sinus tachycardia  Axis: leftward  Intervals:none  ST&T Change: No STEMI  ____________________________________________  RADIOLOGY  No results found.  ____________________________________________   PROCEDURES  Procedure(s) performed: None  Procedures  Critical Care performed: No ____________________________________________   INITIAL IMPRESSION / ASSESSMENT AND PLAN / ED COURSE  Pertinent labs & imaging results that were available during my care of the patient were reviewed by me and considered in my medical decision making (see chart for details).  52 y.o. female with multiple chronic comorbidities, history of chronic headaches and migraines, presenting with left-sided headache with left ear pain, nausea without vomiting.  Overall, the patient does have some mild tachycardia and dry mucous membranes with a recent history of decreased p.o. intake, concerning for some mild hypovolemia.  We will treat her with intravenous fluids for this.  Do not see any evidence of sinusitis clinically on her examination today.  It is possible the patient has a migraine, and I will treat her with Toradol and Compazine as well as intravenous fluids for this.  Given that she does have tenderness to palpation over the temple, we will also get a sed rate to evaluate for possible temporal arteritis although this would be a more rare condition.  We will check her for hyperglycemia, UTI, or other electrolyte abnormalities.  Plan reevaluation for final disposition.  ----------------------------------------- 3:42 PM on 12/04/2017 -----------------------------------------  The patient is resting comfortably and feeling significantly better at this time.  She does have laboratory studies which are consistent with her clinical findings of mild dehydration;  her  creatinine is elevated.  She also has hyperglycemia with an anion gap of 22, so we will follow this up with a VBG for reevaluation.  We will give the patient 2 L of fluid to make sure she is able to tolerate liquids by mouth, and recheck her BMP once that is complete.  Her urinalysis has glucosuria and ketonuria, but no evidence of infection today.  [name] sed rate is 4, which makes temporal arteritis a very unlikely cause for her symptoms.  Plan reevaluation for final disposition.  ----------------------------------------- 7:52 PM on 12/04/2017 -----------------------------------------  The patient is feeling significantly better at this time and she is tolerating liquids without any difficulty.  Her repeat laboratory studies show improved hyperglycemia with anion gap from 22-16 and no acidemia on her pH from her blood gas.  Her creatinine has also improved.  Time, the patient is safe for discharge home.  I discussed return for questions as well as follow-up instructions with the patient.  ____________________________________________  FINAL CLINICAL IMPRESSION(S) / ED DIAGNOSES  Final diagnoses:  Acute renal insufficiency  Dehydration  Other migraine with status migrainosus, not intractable  Hyperglycemia         NEW MEDICATIONS STARTED DURING THIS VISIT:  Current Discharge Medication List    START taking these medications   Details  ibuprofen (ADVIL,MOTRIN) 600 MG tablet Take 1 tablet (600 mg total) by mouth every 8 (eight) hours as needed for headache, mild pain or moderate pain. With food Qty: 15 tablet, Refills: 0    prochlorperazine (COMPAZINE) 5 MG tablet Take 1 tablet (5 mg total) by mouth every 6 (six) hours as needed for nausea or vomiting. Qty: 12 tablet, Refills: 0          Eula Listen, MD 12/04/17 [phone]

## 2017-12-04 NOTE — Discharge Instructions (Signed)
Drink plenty of fluids to stay well-hydrated.  Please have your primary care physician recheck your kidney function.  You may take Tylenol or Motrin for mild to moderate pain.  Zofran is for nausea.  Return to the emergency department if you develop severe pain, fever, inability to keep down fluids, or any other symptoms concerning to you.

## 2017-12-04 NOTE — ED Triage Notes (Signed)
Pt from home with reports that she has had a headache x 2 days. States that she is on her second round of abx for sinus infection currently. Pt reports her PCP told her if the sinus infections did not improve then she would need a CT scan. Pt A/O x 4.

## 2017-12-04 NOTE — ED Notes (Signed)
Pt states she has had a sinus infection for the past 3 weeks, was rx doxycycline for 17 days and is now on a 10 day course of Levaquin.

## 2017-12-06 IMAGING — CT CT RENAL STONE PROTOCOL
2 of 4 series · 16 of 46 positions shown, 18 images · non-contrast
Comparison: 01/06/2016

CLINICAL DATA: Left upper quadrant abdominal pain, nausea and
diarrhea for 2 months.

EXAM:
CT ABDOMEN AND PELVIS WITHOUT CONTRAST
TECHNIQUE: Multidetector CT imaging of the abdomen and pelvis was performed
following the standard protocol without IV contrast.

[Series 2: axial st · axial · 0.69mm/px · z∈[-516,-86]mm · 13 of 94 slices shown, 15 images]
[im 4/94  soft-tissue]
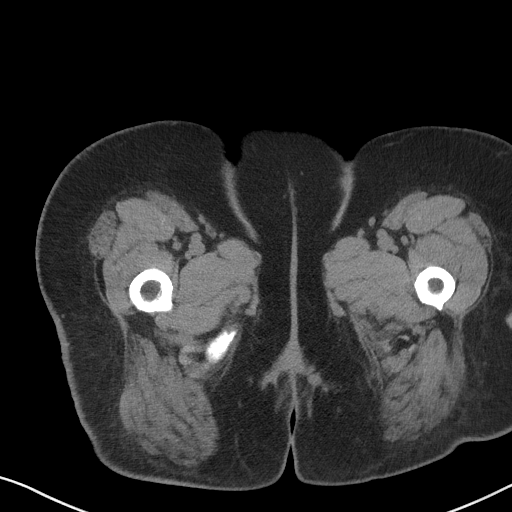
[im 4/94  bone]
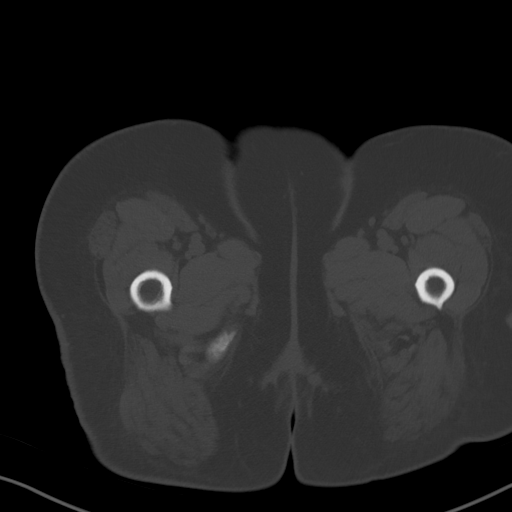
[im 12/94  soft-tissue]
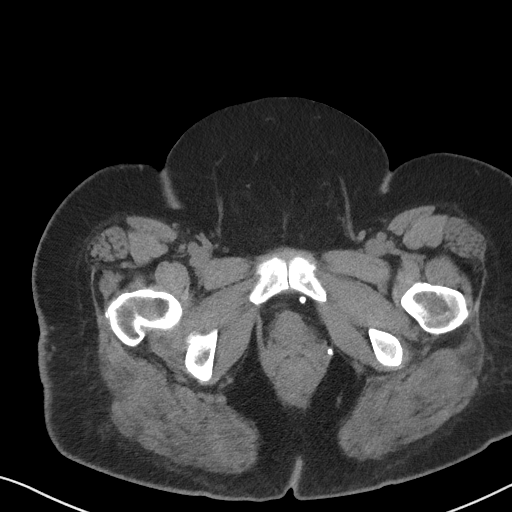
[im 19/94  soft-tissue]
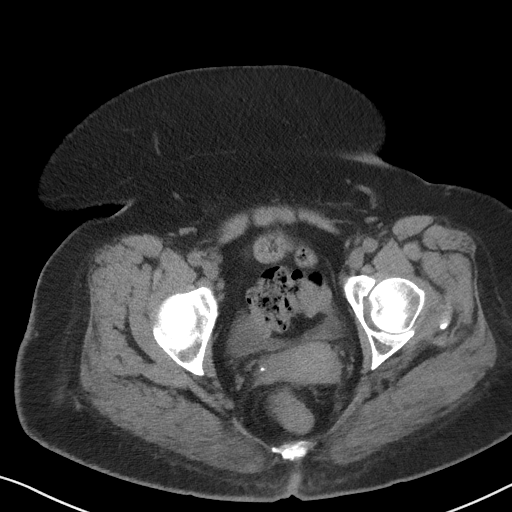
[im 27/94  soft-tissue]
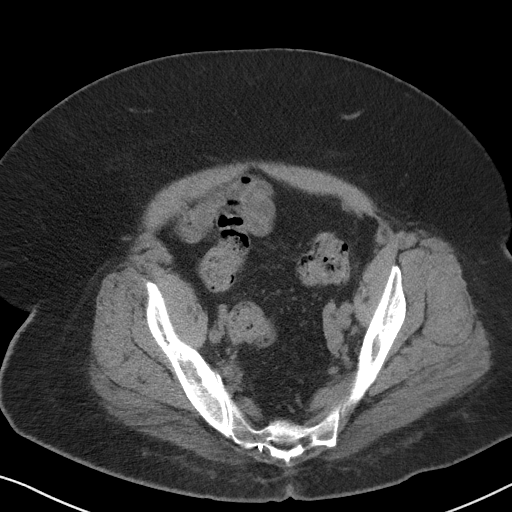
[im 34/94  soft-tissue]
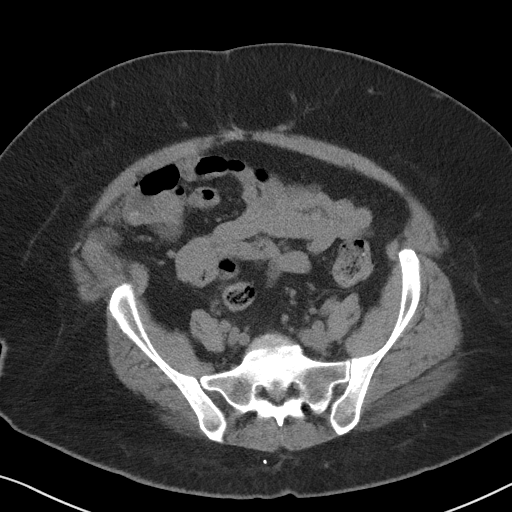
[im 41/94  soft-tissue]
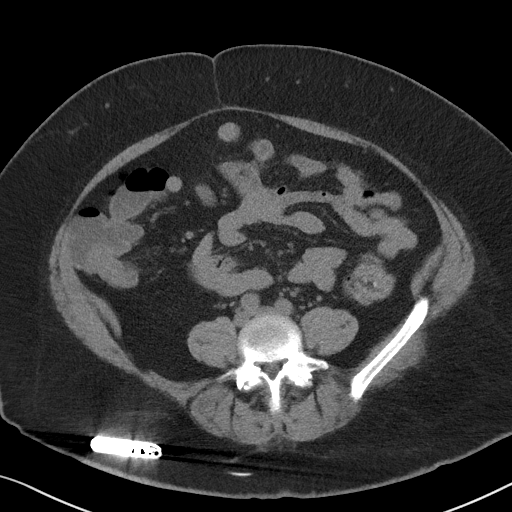
[im 49/94  soft-tissue]
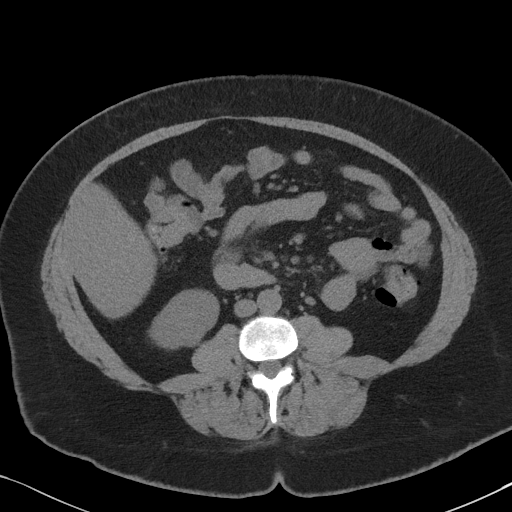
[im 53/94  soft-tissue]
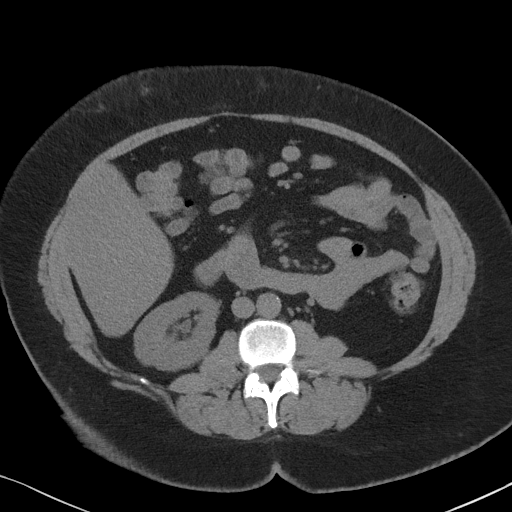
[im 60/94  soft-tissue]
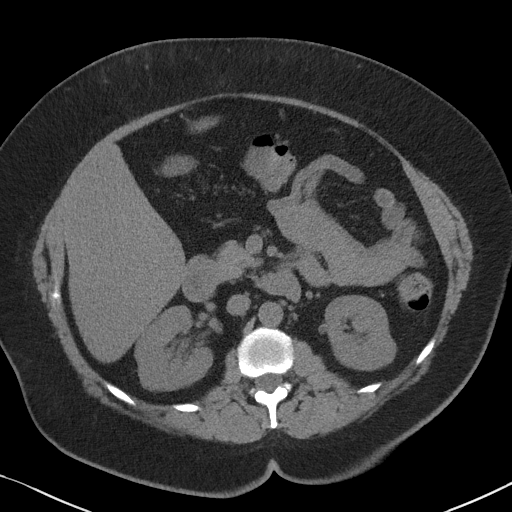
[im 60/94  bone]
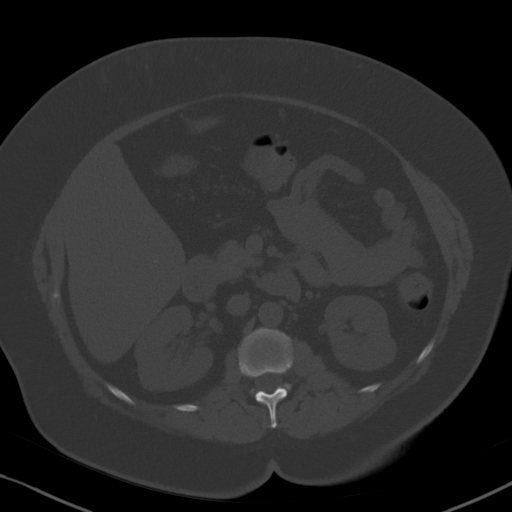
[im 67/94  soft-tissue]
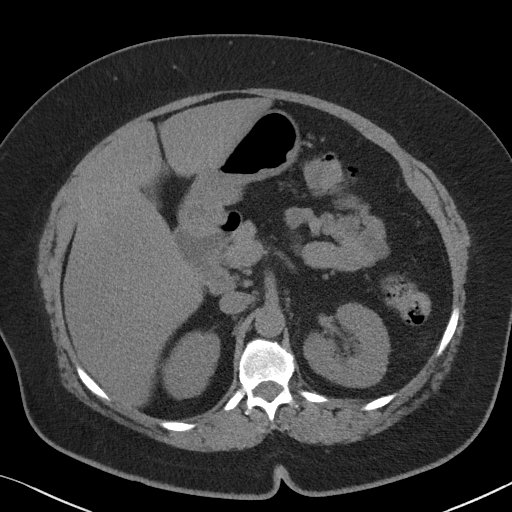
[im 75/94  soft-tissue]
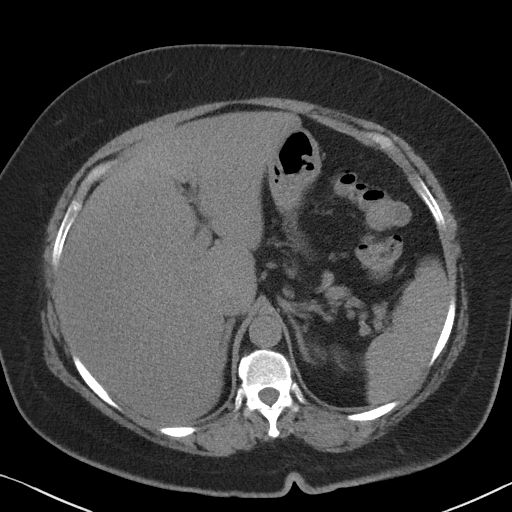
[im 82/94  soft-tissue]
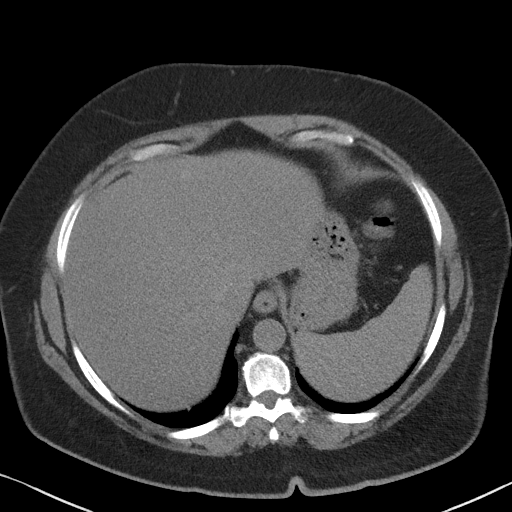
[im 90/94  soft-tissue]
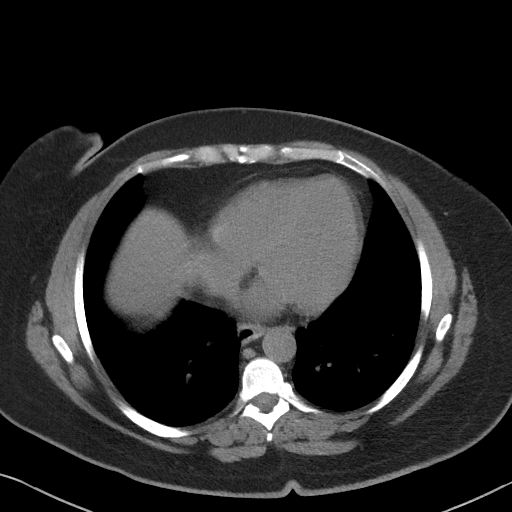

[Series 5: coronal · coronal · 0.70mm/px · 3 of 148 slices shown]
[im 50/148  soft-tissue]
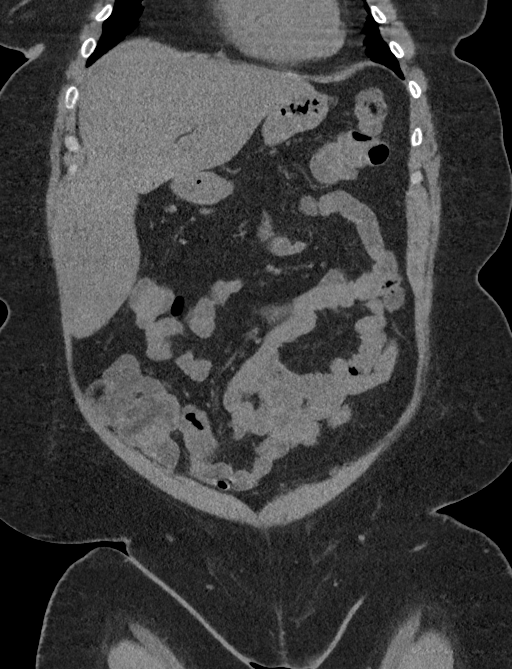
[im 66/148  soft-tissue]
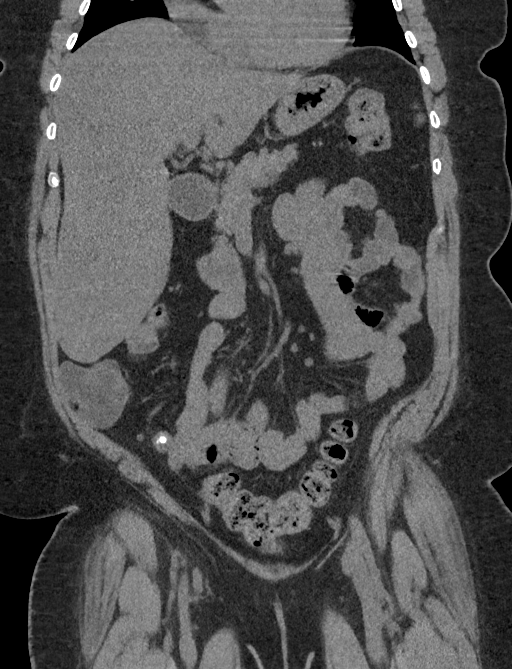
[im 82/148  soft-tissue]
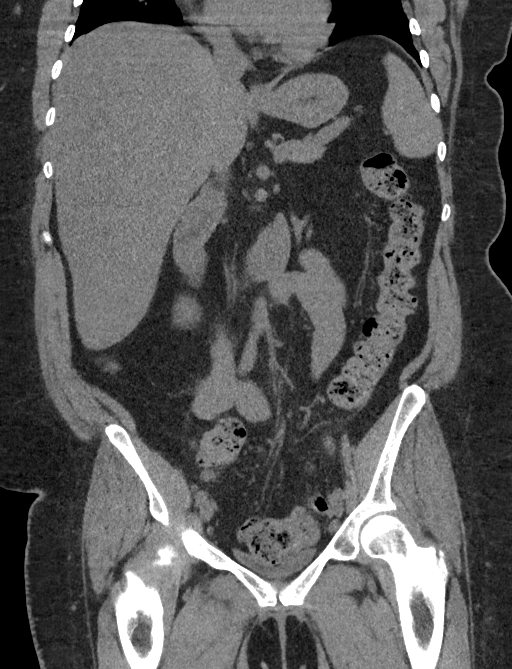

[16 of 46 positions shown; findings below may reference images not displayed]

FINDINGS: Lower chest: The lung bases are grossly clear. The heart is upper
limits of normal in size. No pericardial effusion. The distal
esophagus is grossly normal.

Hepatobiliary: No focal hepatic lesions or intrahepatic biliary
dilatation. The gallbladder is surgically absent. There is mild
diffuse fatty infiltration of the liver.

Pancreas: No mass, inflammation or ductal dilatation.

Spleen: Normal size.  No focal lesions.

Adrenals/Urinary Tract: The adrenal glands and kidneys are
unremarkable. No renal, ureteral or bladder calculi or mass.

Stomach/Bowel: The stomach, duodenum, small bowel and colon are
grossly normal without oral contrast. No inflammatory changes, mass
lesions or obstructive findings. Fecaliths and appendicoliths are
noted.

Vascular/Lymphatic: The aorta is normal in caliber. Scattered
atherosclerotic calcifications. No mesenteric or retroperitoneal
mass or adenopathy.

Reproductive: The uterus and ovaries are normal.

Other: No pelvic mass or adenopathy. No free pelvic fluid
collections.

Musculoskeletal: No significant bony findings. Advanced facet
disease noted at L5-S1. There is a sacral nerve stimulator noted on
the right side.
IMPRESSION: 1. No acute abdominal/pelvic findings, mass lesions or adenopathy.
2. No renal, ureteral or bladder calculi or mass.
3. Mild diffuse fatty infiltration of the liver.
4. Status post cholecystectomy without biliary dilatation.

## 2017-12-10 ENCOUNTER — Ambulatory Visit (INDEPENDENT_AMBULATORY_CARE_PROVIDER_SITE_OTHER): Payer: Medicare HMO | Admitting: Urology

## 2017-12-10 ENCOUNTER — Encounter: Payer: Self-pay | Admitting: Urology

## 2017-12-10 VITALS — BP 133/84 | HR 71 | Ht 59.0 in | Wt 195.0 lb

## 2017-12-10 DIAGNOSIS — N3946 Mixed incontinence: Secondary | ICD-10-CM

## 2017-12-10 NOTE — Progress Notes (Addendum)
12/10/2017 10:15 AM   [name] Jul 16, 1966 [id number]  Referring provider: Arnetha Courser, MD [address] [place], [place] [address]  Chief Complaint  Patient presents with  . Follow-up    HPI: [name]: Patient is a 52 year old female with a history of PVD, diabetes, IC, CKD stage III, bipolar disorder, OAB status post InterStim placement in 2011.  2017: She reports that the device was placed by Dr. Davis Gourd. She reports excellent management of her urinary symptoms of incontinence and frequency since the device was placed until the last few months. She has not had her device interrogated since 2011. She thinks her battery may have died.  The patient had InterStim in 2011 for urge incontinence and enuresis wearing 4-5 pads a day and right lower quadrant discomfort. She said she was dry and not wearing pads until November  He currently leaks with coughing sneezing bending and lifting. She now has urge incontinence and enuresis wearing 3 pads a day moderately wet. She'll leak when she goes from a sitting to standing position. Voiding every hour. She gets up 2-3 times a night. She now has her right lower quadrant pain.  She says when she would turn the device up she feels it over the device and not in the vagina.  Today When I saw the patient in February 2017 the device x-rays looked in good position.  It was interrogated and the impedance check was normal.  She turned the device up she felt it vaginally.  She had good battery life then.  She recently saw Larene [facility] and had a KUB November 27, 2017.  Impedance check was done and bladder was found to be dead.  She has been having some vaginal discomfort apparently with intercourse and has had a previous bladder suspension.  The patient thinks her InterStim was working beautifully until several weeks ago.  She reported being almost completely dry.  Now she has urge incontinence and milder stress incontinence.  She does not  wear pads because she says they irritate her.  She changes her close several times.  She ranks the incontinence is milder.  She is now getting up 4 times at night and set up twice.  She can void every 15-20 minutes and cannot hold it for 2 hours.  She feels discomfort or pressure over the IPG.  She is allergic to amoxicillin  Modifying factors: There are no other modifying factors  Associated signs and symptoms: There are no other associated signs and symptoms Aggravating and relieving factors: There are no other aggravating or relieving factors Severity: Moderate Duration: Persistent      PMH: Past Medical History:  Diagnosis Date  . Asthma   . Bipolar depression ([place])   . CHF (congestive heart failure) ([place])   . Chronic headaches   . COPD (chronic obstructive pulmonary disease) ([place])   . Diabetic neuropathy ([place])   . GERD (gastroesophageal reflux disease)   . Headache   . Heart attack ([place])   . Hyperlipidemia   . Hypertension   . Hypomagnesemia   . Hypothyroid   . Irregular heart rhythm   . Liver lesion    Favored to be benign on CT; MRI of abd w/contract in Aug 2016. Liver Biopsy-Negative, March 2016  . Morbid obesity ([place])   . OSA (obstructive sleep apnea)   . Pinched nerve    Back  . Pneumonia   . RLS (restless legs syndrome)   . Seasonal allergies   . Sleep apnea   .  Type 2 diabetes, uncontrolled, with neuropathy ([place]) 10/15/2015   Overview:  Microalbumin 7.7 12/2010: Hgb A1c 10.2% 12/2010 and 9.7% 03/2011, eye exam 10/2009 [facility]   . Urinary incontinence   . Vitamin D deficiency     Surgical History: Past Surgical History:  Procedure Laterality Date  . ABDOMINAL HYSTERECTOMY    . ABDOMINAL HYSTERECTOMY     Partial  . BLADDER SURGERY     x 2  . CHOLECYSTECTOMY    . LIVER BIOPSY  March 2016   Negative  . LUNG BIOPSY    . TUBAL LIGATION      Home Medications:  Allergies as of 12/10/2017      Reactions   Morphine And Related    anaphylaxis   Codeine     REACTION: itch, rash   Codeine    itching   Hydrocodone-acetaminophen    REACTION: rash   Mirtazapine    REACTION: closes throat   Morphine And Related    Sulfamethoxazole-trimethoprim    REACTION: Whelps all over   Vicodin [hydrocodone-acetaminophen]    itching      Medication List        Accurate as of 12/10/17 10:15 AM. Always use your most recent med list.          albuterol 108 (90 Base) MCG/ACT inhaler Commonly known as:  PROVENTIL HFA;VENTOLIN HFA Inhale 2 puffs into the lungs every 4 (four) hours as needed for wheezing or shortness of breath.   amLODipine 2.5 MG tablet Commonly known as:  NORVASC Take 2.5 mg by mouth daily.   aspirin EC 81 MG tablet Take 1 tablet (81 mg total) by mouth daily.   atorvastatin 40 MG tablet Commonly known as:  LIPITOR Take 1 tablet (40 mg total) by mouth at bedtime.   budesonide-formoterol 160-4.5 MCG/ACT inhaler Commonly known as:  SYMBICORT Inhale 2 puffs into the lungs 2 (two) times daily.   cholecalciferol 1000 units tablet Commonly known as:  VITAMIN D Take 1,000 Units by mouth daily.   DULoxetine 60 MG capsule Commonly known as:  CYMBALTA Take 120 mg by mouth daily.   ezetimibe 10 MG tablet Commonly known as:  ZETIA Take 10 mg by mouth daily.   fenofibrate 145 MG tablet Commonly known as:  TRICOR Take 145 mg by mouth daily.   fenofibrate micronized 134 MG capsule Commonly known as:  LOFIBRA   fluticasone 50 MCG/ACT nasal spray Commonly known as:  FLONASE Place 1 spray into both nostrils daily.   gabapentin 300 MG capsule Commonly known as:  NEURONTIN Take 1 capsule (300 mg total) by mouth 2 (two) times daily.   HUMULIN R U-500 KWIKPEN 500 UNIT/ML kwikpen Generic drug:  insulin regular human CONCENTRATED Inject 60 Units as directed 3 (three) times daily.   hydrOXYzine 50 MG capsule Commonly known as:  VISTARIL Take 50 mg by mouth 2 (two) times daily as needed.   ibuprofen 600 MG tablet Commonly  known as:  ADVIL,MOTRIN Take 1 tablet (600 mg total) by mouth every 8 (eight) hours as needed for headache, mild pain or moderate pain. With food   insulin aspart 100 UNIT/ML injection Commonly known as:  novoLOG Inject into the skin 3 (three) times daily before meals.   JARDIANCE 25 MG Tabs tablet Generic drug:  empagliflozin Take 25 mg by mouth daily.   lamoTRIgine 25 MG tablet Commonly known as:  LAMICTAL Take 25 mg by mouth 2 (two) times daily.   lamoTRIgine 200 MG tablet Commonly  known as:  LAMICTAL Take 200 mg daily by mouth.   levofloxacin 500 MG tablet Commonly known as:  LEVAQUIN Take 1 tablet (500 mg total) by mouth daily.   levothyroxine 75 MCG tablet Commonly known as:  SYNTHROID, LEVOTHROID Take 1 tablet (75 mcg total) by mouth daily.   linaclotide 72 MCG capsule Commonly known as:  LINZESS Take 1 capsule (72 mcg total) by mouth daily before breakfast.   loratadine 10 MG tablet Commonly known as:  CLARITIN Take 1 tablet (10 mg total) by mouth daily.   magnesium oxide 400 MG tablet Commonly known as:  MAG-OX Take 400 mg by mouth daily.   metoprolol tartrate 25 MG tablet Commonly known as:  LOPRESSOR Take 1 tablet (25 mg total) by mouth 2 (two) times daily.   ONETOUCH VERIO test strip Generic drug:  glucose blood   oseltamivir 75 MG capsule Commonly known as:  TAMIFLU Take 1 capsule (75 mg total) by mouth 2 (two) times daily.   pantoprazole 40 MG tablet Commonly known as:  PROTONIX Take 1 tablet (40 mg total) by mouth daily.   Prasterone 6.5 MG Inst Commonly known as:  INTRAROSA Place 1 suppository vaginally daily.   prochlorperazine 5 MG tablet Commonly known as:  COMPAZINE Take 1 tablet (5 mg total) by mouth every 6 (six) hours as needed for nausea or vomiting.   QUEtiapine 300 MG tablet Commonly known as:  SEROQUEL Take 300 mg by mouth 2 (two) times daily.   traMADol 50 MG tablet Commonly known as:  ULTRAM Take 1 tablet (50 mg total)  by mouth every 6 (six) hours as needed.   traZODone 50 MG tablet Commonly known as:  DESYREL Take 50 mg by mouth 2 (two) times daily.   TRULICITY 1.5 RW/4.3XV Sopn Generic drug:  Dulaglutide 1.5 mg once a week.   ULTICARE SHORT PEN NEEDLES 31G X 8 MM Misc Generic drug:  Insulin Pen Needle       Allergies:  Allergies  Allergen Reactions  . Morphine And Related     anaphylaxis  . Codeine     REACTION: itch, rash  . Codeine     itching  . Hydrocodone-Acetaminophen     REACTION: rash  . Mirtazapine     REACTION: closes throat  . Morphine And Related   . Sulfamethoxazole-Trimethoprim     REACTION: Whelps all over  . Vicodin [Hydrocodone-Acetaminophen]     itching    Family History: Family History  Problem Relation Age of Onset  . Diabetes Mother   . Heart disease Mother   . Hyperlipidemia Mother   . Hypertension Mother   . Kidney disease Mother   . Mental illness Mother   . Hypothyroidism Mother   . Stroke Mother   . Osteoporosis Mother   . Glaucoma Mother   . Congestive Heart Failure Mother   . Hypertension Father   . Asthma Daughter   . Cancer Daughter        throat  . Bipolar disorder Daughter   . Heart disease Maternal Grandfather   . Rheum arthritis Maternal Grandfather   . Cancer Maternal Grandfather        liver  . Kidney disease Maternal Grandfather   . Cancer Paternal Grandmother        lung  . Kidney disease Brother   . Breast cancer Maternal Grandmother 60  . Bipolar disorder Daughter   . Hypertension Daughter   . Restless legs syndrome Daughter     Social  History:  reports that she has never smoked. She has never used smokeless tobacco. She reports that she does not drink alcohol or use drugs.  ROS:                                        Physical Exam: BP 133/84 (BP Location: Right Arm, Patient Position: Sitting, Cuff Size: Normal)   Pulse 71   Ht 4' 11"  (1.499 m)   Wt 195 lb (88.5 kg)   BMI 39.39 kg/m     Constitutional:  Alert and oriented, No acute distress. HEENT: [place] AT, moist mucus membranes.  Trachea midline, no masses. Cardiovascular: No clubbing, cyanosis, or edema. Respiratory: Normal respiratory effort, no increased work of breathing. GI: Abdomen is soft, nontender, nondistended, no abdominal masses GU: No evidence of infection.  Well-healed incision in right buttock and just right of midline. Skin: No rashes, bruises or suspicious lesions. Lymph: No cervical or inguinal adenopathy. Neurologic: Grossly intact, no focal deficits, moving all 4 extremities. Psychiatric: Normal mood and affect.  Laboratory Data: Lab Results  Component Value Date   WBC 8.8 12/04/2017   HGB 14.6 12/04/2017   HCT 45.9 12/04/2017   MCV 83.9 12/04/2017   PLT 366 12/04/2017    Lab Results  Component Value Date   CREATININE 1.33 (H) 12/04/2017    No results found for: PSA  No results found for: TESTOSTERONE  Lab Results  Component Value Date   HGBA1C 8.9 11/10/2017    Urinalysis    Component Value Date/Time   COLORURINE YELLOW (A) 12/04/2017 1429   APPEARANCEUR CLEAR (A) 12/04/2017 1429   APPEARANCEUR Clear 11/27/2017 1029   LABSPEC 1.030 12/04/2017 1429   LABSPEC 1.026 10/09/2014 1808   PHURINE 5.0 12/04/2017 1429   GLUCOSEU >=500 (A) 12/04/2017 1429   GLUCOSEU >=500 10/09/2014 1808   HGBUR NEGATIVE 12/04/2017 1429   HGBUR moderate 01/04/2009 0913   BILIRUBINUR NEGATIVE 12/04/2017 1429   BILIRUBINUR Negative 11/27/2017 1029   BILIRUBINUR Negative 10/09/2014 1808   KETONESUR 80 (A) 12/04/2017 1429   PROTEINUR NEGATIVE 12/04/2017 1429   UROBILINOGEN negative (A) 09/10/2017 1147   UROBILINOGEN 0.2 01/04/2009 0913   NITRITE NEGATIVE 12/04/2017 1429   LEUKOCYTESUR NEGATIVE 12/04/2017 1429   LEUKOCYTESUR Negative 11/27/2017 1029   LEUKOCYTESUR Negative 10/09/2014 1808    Pertinent Imaging: None  Assessment & Plan: I reviewed the replacement of the IPG and possibly the lead  with her.  She understands that the lead will be tested intraoperatively.  It is now a number of years old.  If it was replaced we talked about a retained tip and sequelae.  We talked about increased infection rates.  We talked about efficacy especially if the lead is change in the position may not be exactly the same.  She is trying the cream for the vaginal discomfort or irritation.  She is being treated for vaginal dryness.  She understands that I felt that the right lower quadrant but more the right inguinal discomfort was not related to her InterStim or bladder.  We will proceed accordingly  Pros and cons and risks utilizing my usual template was discussed  There are no diagnoses linked to this encounter.  No follow-ups on file.  Reece Packer, MD  [facility] [address], [place], [place] [address] [phone]

## 2017-12-12 ENCOUNTER — Other Ambulatory Visit: Payer: Self-pay | Admitting: Radiology

## 2017-12-12 DIAGNOSIS — N3941 Urge incontinence: Secondary | ICD-10-CM

## 2017-12-12 DIAGNOSIS — T85193A Other mechanical complication of implanted electronic neurostimulator, generator, initial encounter: Secondary | ICD-10-CM

## 2017-12-12 DIAGNOSIS — T85191A Other mechanical complication of implanted electronic neurostimulator (electrode) of peripheral nerve, initial encounter: Secondary | ICD-10-CM

## 2017-12-17 ENCOUNTER — Ambulatory Visit (INDEPENDENT_AMBULATORY_CARE_PROVIDER_SITE_OTHER): Payer: Medicare HMO | Admitting: Nurse Practitioner

## 2017-12-17 ENCOUNTER — Encounter: Payer: Self-pay | Admitting: Nurse Practitioner

## 2017-12-17 VITALS — BP 132/68 | HR 103 | Temp 98.3°F | Resp 18 | Ht 59.52 in | Wt 193.0 lb

## 2017-12-17 DIAGNOSIS — R51 Headache: Secondary | ICD-10-CM

## 2017-12-17 DIAGNOSIS — E1165 Type 2 diabetes mellitus with hyperglycemia: Secondary | ICD-10-CM

## 2017-12-17 DIAGNOSIS — R110 Nausea: Secondary | ICD-10-CM | POA: Diagnosis not present

## 2017-12-17 DIAGNOSIS — IMO0002 Reserved for concepts with insufficient information to code with codable children: Secondary | ICD-10-CM

## 2017-12-17 DIAGNOSIS — R519 Headache, unspecified: Secondary | ICD-10-CM

## 2017-12-17 DIAGNOSIS — E1140 Type 2 diabetes mellitus with diabetic neuropathy, unspecified: Secondary | ICD-10-CM | POA: Diagnosis not present

## 2017-12-17 DIAGNOSIS — N289 Disorder of kidney and ureter, unspecified: Secondary | ICD-10-CM

## 2017-12-17 DIAGNOSIS — J320 Chronic maxillary sinusitis: Secondary | ICD-10-CM

## 2017-12-17 MED ORDER — ONDANSETRON HCL 4 MG PO TABS: 4.0000 mg | ORAL_TABLET | Freq: Three times a day (TID) | ORAL | 0 refills | Status: DC | PRN

## 2017-12-17 NOTE — Patient Instructions (Signed)
-   Follow up with ENT - Nausea medicine as needed - Tylenol for pain; no more than 3,024m/day - lab work today If you have not heard anything from my staff in a week about any orders/referrals/studies from today, please contact [facility] to follow-up [phone] Sinus Headache A sinus headache happens when your sinuses become clogged or swollen. You may feel pain or pressure in your face, forehead, ears, or upper teeth. Sinus headaches can be mild or severe. Follow these instructions at home:  Take medicines only as told by your doctor.  If you were given an antibiotic medicine, finish all of it even if you start to feel better.  Use a nose spray if you feel stuffed up (congested).  If told, apply a warm, moist washcloth to your face to help lessen pain. Contact a doctor if:  You get headaches more than one time each week.  Light or sound bothers you.  You have a fever.  You feel sick to your stomach (nauseous) or you throw up (vomit).  Your headaches do not get better with treatment. Get help right away if:  You have trouble seeing.  You suddenly have very bad pain in your face or head.  You start to twitch or shake (seizure).  You are confused.  You have a stiff neck. This information is not intended to replace advice given to you by your health care provider. Make sure you discuss any questions you have with your health care provider. Document Released: 12/21/2010 Document Revised: 04/16/2016 Document Reviewed: 08/17/2014 Elsevier Interactive Patient Education  2[employer]

## 2017-12-17 NOTE — Progress Notes (Addendum)
Name: [name]   MRN: [id number]    DOB: [birth year]   Date:12/17/2017       Progress Note  Subjective  Chief Complaint  Chief Complaint  Patient presents with  . Sinus Pressure    Onset-2 months, has seen [name] been put on 3 antibiotics and still not cleared up. Doxycycline for 7 days and then 10 days, Levaquin for 10 days.   . Ear Pressure    Bilateral sides but worst on left, sinus pressure, sinus drainage and sore throat.     HPI  Patient endorses over 2 months of sinus/ear pressure and drainage. Saw [name] on 2-26 and placed on doxy for 7 days and then again for 10 days. Seen on 3-27 by [name] and prescribed Levaquin. Seen in ER on 12-04-2017 no evidence of sinusitis seen there was given Toradol and Compazine for headache patient noted to be hyperglycemic given 2 L of fluid discharged home.  Patient presents with  nasal congestion and obstruction, clear nasal discharge, maxillary tooth discomfort, and facial pain or pressure that is worse or localized to the sinuses when bending forward. Patient  Endorses PND, left ear fullness, worse a night- especially with headache endorses nausea at night, halitosis, dry hacking cough.  Denies fever, hyposmia or anosmia,  tinnitus).  Symptoms have been ongoing for over 2 months. Pt  endorse biphasic patten- states has relieved three times with antibiotic but returned 3 days after completion.   DM trulicity weekly, jardiance 25 mg daily, humulin 50 units before every meal (three times a day), novolog- sliding scale. Fasting sugars have ranged 100-120 in the last week.   Patient Active Problem List   Diagnosis Date Noted  . Encounter for Medicare annual wellness exam 10/30/2017  . Patient is full code 10/30/2017  . Disorder of bursae of shoulder region 10/09/2017  . Coronary artery disease 10/09/2017  . Plantar fascial fibromatosis 10/09/2017  . Tendinitis of wrist 10/09/2017  . Schizoaffective disorder, bipolar type ([place]) 09/28/2017   . Chest pain 09/10/2017  . Lobar pneumonia ([place]) 06/05/2017  . Multifocal pneumonia 05/27/2017  . Pleuritic chest pain 05/27/2017  . Hypertriglyceridemia 12/21/2016  . Pansinusitis 11/15/2016  . 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 Breast cancer screening 09/28/2016  . Urine frequency 09/28/2016  . Preventative health care 09/28/2016  . Fatigue 05/01/2016  . Urinary tract infection 03/14/2016  . Adenomatous colon polyp 10/15/2015  . Airway hyperreactivity 10/15/2015  . Bipolar affective disorder ([place]) 10/15/2015  . CN (constipation) 10/15/2015  . CAFL (chronic airflow limitation) ([place]) 10/15/2015  . Drug-induced hepatic toxicity 10/15/2015  . Blood in feces 10/15/2015  . HLD (hyperlipidemia) 10/15/2015  . Hypoxemia 10/15/2015  . NASH (nonalcoholic steatohepatitis) 10/15/2015  . Morbid obesity ([place]) 10/15/2015  . Restless leg syndrome 10/15/2015  . Type 2 diabetes, uncontrolled, with neuropathy ([place]) 10/15/2015  . Elevated alkaline phosphatase level 09/22/2015  . Urinary bladder incontinence 09/22/2015  . Low back pain with left-sided sciatica 08/18/2015  . Encounter for monitoring tricyclic antidepressant therapy 05/13/2015  . Bipolar disorder ([place]) 05/11/2015  . Liver disease 05/11/2015  . Apnea, sleep 05/05/2015  . Heart valve disease 05/05/2015  . Chronic kidney disease, stage III (moderate) (HCC) 03/15/2015  . Migraine 03/12/2015  . COPD (chronic obstructive pulmonary disease) ([place]) 01/20/2015  . 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[place]) 01/20/2015  . GERD (gastroesophageal reflux disease) 01/20/2015  . Hypothyroid   . Vitamin D deficiency   . Urinary incontinence   .  Liver lesion   . Hypomagnesemia   . Obstructive sleep apnea 10/20/2013  . Vaginitis 12/19/2011  . PERIPHERAL NEUROPATHY 03/04/2009  . HEMORRHOIDS, EXTERNAL 09/03/2008  . ALLERGIC RHINITIS 07/04/2007  . ASTHMA, PERSISTENT, MODERATE 07/04/2007  . INTERSTITIAL CYSTITIS 07/04/2007  .  HYPERTENSION, BENIGN ESSENTIAL 06/19/2007    Past Medical History:  Diagnosis Date  . Asthma   . Bipolar depression ([place])   . CHF (congestive heart failure) ([place])   . Chronic headaches   . COPD (chronic obstructive pulmonary disease) ([place])   . Diabetic neuropathy ([place])   . GERD (gastroesophageal reflux disease)   . Headache   . Heart attack ([place])   . Hyperlipidemia   . Hypertension   . Hypomagnesemia   . Hypothyroid   . Irregular heart rhythm   . Liver lesion    Favored to be benign on CT; MRI of abd w/contract in Aug 2016. Liver Biopsy-Negative, March 2016  . Morbid obesity ([place])   . OSA (obstructive sleep apnea)   . Pinched nerve    Back  . Pneumonia   . RLS (restless legs syndrome)   . Seasonal allergies   . Sleep apnea   . Type 2 diabetes, uncontrolled, with neuropathy ([place]) 10/15/2015   Overview:  Microalbumin 7.7 12/2010: Hgb A1c 10.2% 12/2010 and 9.7% 03/2011, eye exam 10/2009 [facility]   . Urinary incontinence   . Vitamin D deficiency     Past Surgical History:  Procedure Laterality Date  . ABDOMINAL HYSTERECTOMY    . ABDOMINAL HYSTERECTOMY     Partial  . BLADDER SURGERY     x 2  . CHOLECYSTECTOMY    . LIVER BIOPSY  March 2016   Negative  . LUNG BIOPSY    . TUBAL LIGATION      Social History   Tobacco Use  . Smoking status: Never Smoker  . Smokeless tobacco: Never Used  Substance Use Topics  . Alcohol use: No     Current Outpatient Medications:  .  albuterol (PROVENTIL HFA;VENTOLIN HFA) 108 (90 Base) MCG/ACT inhaler, Inhale 2 puffs into the lungs every 4 (four) hours as needed for wheezing or shortness of breath., Disp: , Rfl:  .  amLODipine (NORVASC) 2.5 MG tablet, Take 2.5 mg by mouth daily., Disp: , Rfl:  .  aspirin EC 81 MG tablet, Take 1 tablet (81 mg total) by mouth daily., Disp: , Rfl:  .  atorvastatin (LIPITOR) 40 MG tablet, Take 1 tablet (40 mg total) by mouth at bedtime. (Patient taking differently: Take 80 mg by mouth at bedtime. ),  Disp: 90 tablet, Rfl: 1 .  budesonide-formoterol (SYMBICORT) 160-4.5 MCG/ACT inhaler, Inhale 2 puffs into the lungs 2 (two) times daily., Disp: , Rfl:  .  cholecalciferol (VITAMIN D) 1000 units tablet, Take 1,000 Units by mouth daily., Disp: , Rfl:  .  DULoxetine (CYMBALTA) 60 MG capsule, Take 120 mg by mouth daily., Disp: , Rfl:  .  ezetimibe (ZETIA) 10 MG tablet, Take 10 mg by mouth daily. , Disp: , Rfl:  .  fenofibrate (TRICOR) 145 MG tablet, Take 145 mg by mouth daily., Disp: , Rfl:  .  fenofibrate micronized (LOFIBRA) 134 MG capsule, , Disp: , Rfl:  .  fluticasone (FLONASE) 50 MCG/ACT nasal spray, Place 1 spray into both nostrils daily., Disp: , Rfl:  .  gabapentin (NEURONTIN) 300 MG capsule, Take 1 capsule (300 mg total) by mouth 2 (two) times daily., Disp: 60 capsule, Rfl: 5 .  HUMULIN R U-500  KWIKPEN 500 UNIT/ML kwikpen, Inject 60 Units as directed 3 (three) times daily., Disp: , Rfl:  .  hydrOXYzine (VISTARIL) 50 MG capsule, Take 50 mg by mouth 2 (two) times daily as needed. , Disp: , Rfl:  .  ibuprofen (ADVIL,MOTRIN) 600 MG tablet, Take 1 tablet (600 mg total) by mouth every 8 (eight) hours as needed for headache, mild pain or moderate pain. With food, Disp: 15 tablet, Rfl: 0 .  insulin aspart (NOVOLOG) 100 UNIT/ML injection, Inject into the skin 3 (three) times daily before meals., Disp: , Rfl:  .  JARDIANCE 25 MG TABS tablet, Take 25 mg by mouth daily., Disp: , Rfl:  .  lamoTRIgine (LAMICTAL) 200 MG tablet, Take 200 mg daily by mouth., Disp: , Rfl:  .  lamoTRIgine (LAMICTAL) 25 MG tablet, Take 25 mg by mouth 2 (two) times daily., Disp: , Rfl:  .  levothyroxine (SYNTHROID, LEVOTHROID) 75 MCG tablet, Take 1 tablet (75 mcg total) by mouth daily., Disp: 30 tablet, Rfl: 11 .  linaclotide (LINZESS) 72 MCG capsule, Take 1 capsule (72 mcg total) by mouth daily before breakfast., Disp: 90 capsule, Rfl: 3 .  loratadine (CLARITIN) 10 MG tablet, Take 1 tablet (10 mg total) by mouth daily., Disp: 30  tablet, Rfl: 0 .  magnesium oxide (MAG-OX) 400 MG tablet, Take 400 mg by mouth daily., Disp: , Rfl:  .  metoprolol tartrate (LOPRESSOR) 25 MG tablet, Take 1 tablet (25 mg total) by mouth 2 (two) times daily., Disp: 60 tablet, Rfl: 6 .  ONETOUCH VERIO test strip, , Disp: , Rfl:  .  oseltamivir (TAMIFLU) 75 MG capsule, Take 1 capsule (75 mg total) by mouth 2 (two) times daily., Disp: 10 capsule, Rfl: 0 .  pantoprazole (PROTONIX) 40 MG tablet, Take 1 tablet (40 mg total) by mouth daily., Disp: 90 tablet, Rfl: 3 .  Prasterone (INTRAROSA) 6.5 MG INST, Place 1 suppository vaginally daily., Disp: 30 each, Rfl: 12 .  prochlorperazine (COMPAZINE) 5 MG tablet, Take 1 tablet (5 mg total) by mouth every 6 (six) hours as needed for nausea or vomiting., Disp: 12 tablet, Rfl: 0 .  QUEtiapine (SEROQUEL) 300 MG tablet, Take 300 mg by mouth 2 (two) times daily., Disp: , Rfl:  .  traMADol (ULTRAM) 50 MG tablet, Take 1 tablet (50 mg total) by mouth every 6 (six) hours as needed., Disp: 20 tablet, Rfl: 0 .  traZODone (DESYREL) 50 MG tablet, Take 50 mg by mouth 2 (two) times daily., Disp: , Rfl:  .  TRULICITY 1.5 HW/2.9HB SOPN, 1.5 mg once a week. , Disp: , Rfl:  .  ULTICARE SHORT PEN NEEDLES 31G X 8 MM MISC, , Disp: , Rfl:  .  levofloxacin (LEVAQUIN) 500 MG tablet, Take 1 tablet (500 mg total) by mouth daily. (Patient not taking: Reported on 12/17/2017), Disp: 10 tablet, Rfl: 0  Allergies  Allergen Reactions  . Morphine And Related     anaphylaxis  . Codeine     REACTION: itch, rash  . Codeine     itching  . Hydrocodone-Acetaminophen     REACTION: rash  . Mirtazapine     REACTION: closes throat  . Morphine And Related   . Sulfamethoxazole-Trimethoprim     REACTION: [place] all over  . Vicodin [Hydrocodone-Acetaminophen]     itching    ROS  Constitutional: Negative for fever or unintentional weight change.  Respiratory: Positive for cough and Negative shortness of breath.   Cardiovascular: Negative for  chest pain Endorses palpitations  at ER (HR 125 there)- states takes metoprolol for palpitations; took dose today .  Gastrointestinal: Negative for abdominal pain, no bowel changes.  Musculoskeletal: Negative for gait problem Positive for joint swelling- chronic.  Skin: Negative for rash.  Neurological: Negative for dizziness Positive headache Endorses lightheadedness with position changes.  No other specific complaints in a complete review of systems (except as listed in HPI above).  Objective  Vitals:   12/17/17 1109  BP: 132/68  Pulse: (!) 103  Resp: 18  Temp: 98.3 F (36.8 C)  TempSrc: Oral  SpO2: 97%  Weight: 193 lb (87.5 kg)  Height: 4' 11.52" (1.512 m)     Body mass index is 38.3 kg/m.  Nursing Note and Vital Signs reviewed.  Physical Exam   Constitutional: Patient appears well-developed and well-nourished. Obese  No distress.  HEENT: head atraumatic, normocephalic, pupils equal and reactive to light, TM's without erythema or bulging, bilateral maxillary or frontal sinus tenderness, neck supple without lymphadenopathy, oropharynx pink and moist without exudate, no nasal discharge Cardiovascular: Mildly tachy rate, regular rhythm, S1/S2 present.   Pulmonary/Chest: Effort normal and breath sounds clear. Abdominal: Soft and non-tender, bowel sounds present  Psychiatric: Patient has a normal mood and affect. behavior is normal. Judgment and thought content normal.  No results found for this or any previous visit (from the past 72 hour(s)).  Assessment & Plan  Patient presents with Sinus and facial pain for over 2 months has been seen multiple times for same issue was prescribed 3 separate doses of antibiotics doxycycline for 7 days and then again for 10 as well as Levaquin.  Was most recently seen in ER and given Toradol and Compazine for headache.  Patient appears uncomfortable but in no acute distress.  Has sinus tenderness and is tachycardic-uses metoprolol for  tachycardia/palpitations.  Afebrile.  Will send to ENT for further workup.  Diabetes appears well controlled.  Given Zofran for nausea and instructed to take Tylenol and drink water and rest for headaches. 1. Type 2 diabetes, uncontrolled, with neuropathy (HCC) - blood sugar readings at home seem to reflect controlled DM; patient fasting will monitor BMP today  - BASIC METABOLIC PANEL WITH GFR  2. Acute renal insufficiency - Will monitor renal function; check for improvement  - BASIC METABOLIC PANEL WITH GFR  3. Chronic maxillary sinusitis - three failed abx courses will send to ENT for further evaluation;  - Ambulatory referral to ENT  4. Nausea Accompanied with headaches - ondansetron (ZOFRAN) 4 MG tablet; Take 1 tablet (4 mg total) by mouth every 8 (eight) hours as needed for nausea or vomiting.  Dispense: 20 tablet; Refill: 0  5. Persistent headaches - tylenol, drink lots of water, rest.   -Red flags and when to present for emergency care or RTC including fever >101.50F, chest pain, shortness of breath, new/worsening/un-resolving symptoms,  reviewed with patient at time of visit. Follow up and care instructions discussed and provided in AVS.   --------------------------------------------- I have reviewed this encounter including the documentation in this note and/or discussed this patient with the provider, Suezanne Cheshire DNP AGNP-C. I am certifying that I agree with the content of this note as supervising physician. Enid Derry, [facility] 12/19/2017, 6:56 PM

## 2017-12-18 LAB — BASIC METABOLIC PANEL WITH GFR
BUN/Creatinine Ratio: 29 (calc) — ABNORMAL HIGH (ref 6–22)
BUN: 30 mg/dL — ABNORMAL HIGH (ref 7–25)
CHLORIDE: 102 mmol/L (ref 98–110)
CO2: 29 mmol/L (ref 20–32)
Calcium: 10.2 mg/dL (ref 8.6–10.4)
Creat: 1.03 mg/dL (ref 0.50–1.05)
GFR, EST AFRICAN AMERICAN: 71 mL/min/{1.73_m2} (ref >=60)
GFR, EST NON AFRICAN AMERICAN: 62 mL/min/{1.73_m2} (ref >=60)
Glucose, Bld: 107 mg/dL — ABNORMAL HIGH (ref 65–99)
POTASSIUM: 4 mmol/L (ref 3.5–5.3)
SODIUM: 142 mmol/L (ref 135–146)

## 2017-12-24 ENCOUNTER — Encounter
Admission: RE | Admit: 2017-12-24 | Discharge: 2017-12-24 | Disposition: A | Discharge status: Home / Self Care | Payer: Medicare HMO | Source: Ambulatory Visit | Attending: Urology | Admitting: Urology

## 2017-12-24 ENCOUNTER — Other Ambulatory Visit: Payer: Self-pay

## 2017-12-24 HISTORY — DX: Major depressive disorder, single episode, unspecified: F32.9

## 2017-12-24 HISTORY — DX: Fatty (change of) liver, not elsewhere classified: K76.0

## 2017-12-24 HISTORY — DX: Unspecified osteoarthritis, unspecified site: M19.90

## 2017-12-24 HISTORY — DX: Anxiety disorder, unspecified: F41.9

## 2017-12-24 HISTORY — DX: Atherosclerotic heart disease of native coronary artery without angina pectoris: I25.10

## 2017-12-24 HISTORY — DX: Cardiac arrhythmia, unspecified: I49.9

## 2017-12-24 HISTORY — DX: Depression, unspecified: F32.A

## 2017-12-24 HISTORY — DX: Peripheral vascular disease, unspecified: I73.9

## 2017-12-24 HISTORY — DX: Dyspnea, unspecified: R06.00

## 2017-12-24 HISTORY — DX: Chronic kidney disease, unspecified: N18.9

## 2017-12-24 NOTE — Pre-Procedure Instructions (Signed)
Patient states she falls quite frequently.  7 times since the first of the year.

## 2017-12-24 NOTE — Patient Instructions (Signed)
Your procedure is scheduled on: Monday THE 29th of April  Report to [facility]. DO NOT STOP ON THE FIRST FLOOR  To find out your arrival time please call [phone] between 1PM - 3PM on Friday, April 26th  Remember: Instructions that are not followed completely may result in serious  medical risk, up to and including death, or upon the discretion of your surgeon  and anesthesiologist your surgery may need to be rescheduled.     _X__ 1. Do not eat food after midnight the night before your procedure.                 No gum chewing or hard candies.                   You may drink clear liquids up to 2 hours                 before you are scheduled to arrive for your surgery-                   DO not drink clear liquids within 2 hours of the start of your surgery.                   Clear Liquids include:  water, apple juice without pulp, clear carbohydrate                 drink such as Clearfast of [place], Black Coffee or Tea (Do not add                 anything to coffee or tea).  __X__2.  On the morning of surgery brush your teeth with toothpaste and water,                          you may rinse your mouth with mouthwash if you wish.                                Do not swallow any toothpaste of mouthwash.     _X__ 3.  No Alcohol for 24 hours before or after surgery.   _X__ 4.  Do Not Smoke or use e-cigarettes For 24 Hours Prior to Your Surgery.                 Do not use any chewable tobacco products for at least 6 hours prior to                 surgery.  ____  5.  Bring all medications with you on the day of surgery if instructed.   _x___  6.  Notify your doctor if there is any change in your medical condition      (cold, fever, infections).     Do not wear jewelry, make-up, hairpins, clips or nail polish. Do not wear lotions, powders, or perfumes. You may wear deodorant. Do not shave 48 hours prior to surgery. Men may  shave face and neck. Do not bring valuables to the hospital.    [facility] is not responsible for any belongings or valuables.  Contacts, dentures or bridgework may not be worn into surgery. Leave your suitcase in the car. After surgery it may be brought to your room. For patients admitted to the hospital, discharge time is determined by your treatment team.   Patients  discharged the day of surgery will not be allowed to drive home.   Please read over the following fact sheets that you were given:   PREPARING FOR SURGERY   ____ Take these medicines the morning of surgery with A SIP OF WATER:    1. ALBUTEROL AND BRING TO [place] WITH YOU  2. AMLODIPINE  3. CYMBALTA(if you take in the morning)  4. TRICOR (if you take in the morning)  5. GABAPENTIN  6. LAMICTAL (if you take in morning)             7. SYNTHROID             8. METOPROLOL             9. PROTONIX            10. ATORVASTATIN (If you take in the morning)  ____ Fleet Enema (as directed)   __x__TAKE A SHOWER USING ANTIMICROBIAL/ANTIBACTERIAL SOAP ON THE NIGHT BEFORE                SURGERY AND THE MORNING OF SURGERY  _X___ Use inhalers on the day of surgery  _X__ Take 1/2 of usual insulin dose the night before surgery. No insulin the morning          of surgery. HALF OF HUMULIN THE NIGHT BEFORE AND NOTHING IN THE MORNING.                            HALF OF NOVOLOG THE NIGHT BEFORE AND NOTHING IN THE MORNING.                            NO ZETIA THE NIGHT BEFORE SURGERY OR THE MORNING OF SURGERY.                            NO JARDIANCE THE NIGHT BEFORE SURGERY OR THE MORNING OF SURGERY.  _X___ Stop ALL ASPIRIN PRODUCTS NOW.               THIS INCLUDES EXCEDRIN / BC POWDER / GOODIES POWDER  __X__ Stop Anti-inflammatories NOW                  THIS INCLUDES IBUPROFEN / MOTRIN / ADVIL / ALEVE  _X___ Stop supplements until after surgery.                    CONTINUE THE MAGNESIUM BUT DO NOT TAKE ON THE MORNING OF  SURGERY.                  CONTINUE TO TAKE THE VITAMIN D SUPPLEMENT BUT NOT ON THE MORNING OF SURGERY.  ____ Bring C-Pap to the hospital.   DO NOT TAKE LINZESS ON THE MORNING OF SURGERY BUT DO CONTINUE TO TAKE UP UNTIL THEN.  TYLENOL IS OKAY TO TAKE AS NEEDED.

## 2017-12-30 MED ORDER — VANCOMYCIN HCL IN DEXTROSE 1-5 GM/200ML-% IV SOLN
1000.0000 mg | INTRAVENOUS | Status: AC
  Administered 2017-12-31: 1000 mg via INTRAVENOUS

## 2017-12-31 ENCOUNTER — Encounter
Admission: RE | Disposition: A | Discharge status: Home / Self Care | Payer: Self-pay | Source: Ambulatory Visit | Attending: Urology

## 2017-12-31 ENCOUNTER — Ambulatory Visit: Payer: Medicare HMO | Admitting: Anesthesiology

## 2017-12-31 ENCOUNTER — Encounter: Payer: Self-pay | Admitting: *Deleted

## 2017-12-31 ENCOUNTER — Ambulatory Visit: Payer: Medicare HMO

## 2017-12-31 ENCOUNTER — Ambulatory Visit
Admission: RE | Admit: 2017-12-31 | Discharge: 2017-12-31 | Disposition: A | Discharge status: Home / Self Care | Payer: Medicare HMO | Source: Ambulatory Visit | Attending: Urology | Admitting: Urology

## 2017-12-31 ENCOUNTER — Other Ambulatory Visit: Payer: Self-pay

## 2017-12-31 DIAGNOSIS — E559 Vitamin D deficiency, unspecified: Secondary | ICD-10-CM | POA: Insufficient documentation

## 2017-12-31 DIAGNOSIS — Y828 Other medical devices associated with adverse incidents: Secondary | ICD-10-CM | POA: Diagnosis not present

## 2017-12-31 DIAGNOSIS — F209 Schizophrenia, unspecified: Secondary | ICD-10-CM | POA: Diagnosis not present

## 2017-12-31 DIAGNOSIS — I2510 Atherosclerotic heart disease of native coronary artery without angina pectoris: Secondary | ICD-10-CM | POA: Insufficient documentation

## 2017-12-31 DIAGNOSIS — I509 Heart failure, unspecified: Secondary | ICD-10-CM | POA: Diagnosis not present

## 2017-12-31 DIAGNOSIS — Z8249 Family history of ischemic heart disease and other diseases of the circulatory system: Secondary | ICD-10-CM | POA: Insufficient documentation

## 2017-12-31 DIAGNOSIS — Z885 Allergy status to narcotic agent status: Secondary | ICD-10-CM | POA: Insufficient documentation

## 2017-12-31 DIAGNOSIS — N3281 Overactive bladder: Secondary | ICD-10-CM | POA: Diagnosis not present

## 2017-12-31 DIAGNOSIS — T85193A Other mechanical complication of implanted electronic neurostimulator, generator, initial encounter: Secondary | ICD-10-CM | POA: Insufficient documentation

## 2017-12-31 DIAGNOSIS — F319 Bipolar disorder, unspecified: Secondary | ICD-10-CM | POA: Diagnosis not present

## 2017-12-31 DIAGNOSIS — K759 Inflammatory liver disease, unspecified: Secondary | ICD-10-CM | POA: Diagnosis not present

## 2017-12-31 DIAGNOSIS — G4733 Obstructive sleep apnea (adult) (pediatric): Secondary | ICD-10-CM | POA: Diagnosis not present

## 2017-12-31 DIAGNOSIS — E785 Hyperlipidemia, unspecified: Secondary | ICD-10-CM | POA: Insufficient documentation

## 2017-12-31 DIAGNOSIS — E1122 Type 2 diabetes mellitus with diabetic chronic kidney disease: Secondary | ICD-10-CM | POA: Diagnosis not present

## 2017-12-31 DIAGNOSIS — N183 Chronic kidney disease, stage 3 (moderate): Secondary | ICD-10-CM | POA: Insufficient documentation

## 2017-12-31 DIAGNOSIS — Z79899 Other long term (current) drug therapy: Secondary | ICD-10-CM | POA: Insufficient documentation

## 2017-12-31 DIAGNOSIS — I130 Hypertensive heart and chronic kidney disease with heart failure and stage 1 through stage 4 chronic kidney disease, or unspecified chronic kidney disease: Secondary | ICD-10-CM | POA: Diagnosis not present

## 2017-12-31 DIAGNOSIS — E039 Hypothyroidism, unspecified: Secondary | ICD-10-CM | POA: Diagnosis not present

## 2017-12-31 DIAGNOSIS — J449 Chronic obstructive pulmonary disease, unspecified: Secondary | ICD-10-CM | POA: Insufficient documentation

## 2017-12-31 DIAGNOSIS — N3941 Urge incontinence: Secondary | ICD-10-CM | POA: Diagnosis not present

## 2017-12-31 DIAGNOSIS — G2581 Restless legs syndrome: Secondary | ICD-10-CM | POA: Diagnosis not present

## 2017-12-31 DIAGNOSIS — T85191A Other mechanical complication of implanted electronic neurostimulator (electrode) of peripheral nerve, initial encounter: Secondary | ICD-10-CM

## 2017-12-31 DIAGNOSIS — K219 Gastro-esophageal reflux disease without esophagitis: Secondary | ICD-10-CM | POA: Insufficient documentation

## 2017-12-31 DIAGNOSIS — F419 Anxiety disorder, unspecified: Secondary | ICD-10-CM | POA: Diagnosis not present

## 2017-12-31 DIAGNOSIS — N3946 Mixed incontinence: Secondary | ICD-10-CM | POA: Insufficient documentation

## 2017-12-31 DIAGNOSIS — G4730 Sleep apnea, unspecified: Secondary | ICD-10-CM | POA: Diagnosis not present

## 2017-12-31 DIAGNOSIS — Z9989 Dependence on other enabling machines and devices: Secondary | ICD-10-CM | POA: Insufficient documentation

## 2017-12-31 DIAGNOSIS — Z7982 Long term (current) use of aspirin: Secondary | ICD-10-CM | POA: Insufficient documentation

## 2017-12-31 DIAGNOSIS — E1151 Type 2 diabetes mellitus with diabetic peripheral angiopathy without gangrene: Secondary | ICD-10-CM | POA: Insufficient documentation

## 2017-12-31 DIAGNOSIS — I252 Old myocardial infarction: Secondary | ICD-10-CM | POA: Insufficient documentation

## 2017-12-31 DIAGNOSIS — Z794 Long term (current) use of insulin: Secondary | ICD-10-CM | POA: Insufficient documentation

## 2017-12-31 DIAGNOSIS — Z6838 Body mass index (BMI) 38.0-38.9, adult: Secondary | ICD-10-CM | POA: Insufficient documentation

## 2017-12-31 DIAGNOSIS — Z419 Encounter for procedure for purposes other than remedying health state, unspecified: Secondary | ICD-10-CM

## 2017-12-31 DIAGNOSIS — Z882 Allergy status to sulfonamides status: Secondary | ICD-10-CM | POA: Insufficient documentation

## 2017-12-31 HISTORY — PX: INTERSTIM-GENERATOR CHANGE: SHX6642

## 2017-12-31 LAB — GLUCOSE, CAPILLARY
GLUCOSE-CAPILLARY: 354 mg/dL — AB (ref 65–99)
GLUCOSE-CAPILLARY: 264 mg/dL — AB (ref 65–99)
Glucose-Capillary: 203 mg/dL — ABNORMAL HIGH (ref 65–99)

## 2017-12-31 SURGERY — REPLACEMENT, PULSE GENERATOR, SACRAL NERVE STIMULATOR, INTERSTIM
Anesthesia: General | Wound class: "Clean "

## 2017-12-31 MED ORDER — FENTANYL CITRATE (PF) 100 MCG/2ML IJ SOLN: 25.0000 ug | INTRAMUSCULAR | Status: DC | PRN

## 2017-12-31 MED ORDER — TRAMADOL HCL 50 MG PO TABS
50.0000 mg | ORAL_TABLET | Freq: Once | ORAL | Status: AC
  Administered 2017-12-31: 50 mg via ORAL

## 2017-12-31 MED ORDER — SODIUM CHLORIDE 0.9 % IV SOLN
INTRAVENOUS | Status: DC
  Administered 2017-12-31: 10:00:00 via INTRAVENOUS

## 2017-12-31 MED ORDER — BUPIVACAINE-EPINEPHRINE 0.5% -1:200000 IJ SOLN
INTRAMUSCULAR | Status: DC | PRN
  Administered 2017-12-31: 5 mL

## 2017-12-31 MED ORDER — LIDOCAINE-EPINEPHRINE (PF) 1 %-1:200000 IJ SOLN
INTRAMUSCULAR | Status: AC
  Filled 2017-12-31: qty 30

## 2017-12-31 MED ORDER — PROPOFOL 500 MG/50ML IV EMUL
INTRAVENOUS | Status: DC | PRN
  Administered 2017-12-31: 75 ug/kg/min via INTRAVENOUS

## 2017-12-31 MED ORDER — ONDANSETRON HCL 4 MG/2ML IJ SOLN: 4.0000 mg | Freq: Once | INTRAMUSCULAR | Status: DC | PRN

## 2017-12-31 MED ORDER — BUPIVACAINE-EPINEPHRINE (PF) 0.5% -1:200000 IJ SOLN
INTRAMUSCULAR | Status: AC
  Filled 2017-12-31: qty 30

## 2017-12-31 MED ORDER — KETAMINE HCL 50 MG/ML IJ SOLN
INTRAMUSCULAR | Status: AC
  Filled 2017-12-31: qty 10

## 2017-12-31 MED ORDER — LIDOCAINE 2% (20 MG/ML) 5 ML SYRINGE
INTRAMUSCULAR | Status: DC | PRN
  Administered 2017-12-31: 50 mg via INTRAVENOUS

## 2017-12-31 MED ORDER — INSULIN ASPART 100 UNIT/ML ~~LOC~~ SOLN
8.0000 [IU] | Freq: Once | SUBCUTANEOUS | Status: AC
  Administered 2017-12-31: 8 [IU] via SUBCUTANEOUS

## 2017-12-31 MED ORDER — KETAMINE HCL 50 MG/ML IJ SOLN
INTRAMUSCULAR | Status: DC | PRN
  Administered 2017-12-31: 25 mg via INTRAVENOUS

## 2017-12-31 MED ORDER — VANCOMYCIN HCL IN DEXTROSE 1-5 GM/200ML-% IV SOLN
INTRAVENOUS | Status: AC
  Filled 2017-12-31: qty 200

## 2017-12-31 MED ORDER — PROPOFOL 10 MG/ML IV BOLUS
INTRAVENOUS | Status: DC | PRN
  Administered 2017-12-31: 20 mg via INTRAVENOUS

## 2017-12-31 MED ORDER — MIDAZOLAM HCL 2 MG/2ML IJ SOLN
INTRAMUSCULAR | Status: AC
  Filled 2017-12-31: qty 2

## 2017-12-31 MED ORDER — TRAMADOL HCL 50 MG PO TABS: 50.0000 mg | ORAL_TABLET | Freq: Four times a day (QID) | ORAL | 0 refills | Status: DC | PRN

## 2017-12-31 MED ORDER — FENTANYL CITRATE (PF) 100 MCG/2ML IJ SOLN
INTRAMUSCULAR | Status: AC
  Filled 2017-12-31: qty 2

## 2017-12-31 MED ORDER — LIDOCAINE HCL (PF) 2 % IJ SOLN
INTRAMUSCULAR | Status: AC
  Filled 2017-12-31: qty 10

## 2017-12-31 MED ORDER — GLYCOPYRROLATE 0.2 MG/ML IJ SOLN
INTRAMUSCULAR | Status: AC
  Filled 2017-12-31: qty 1

## 2017-12-31 MED ORDER — MIDAZOLAM HCL 5 MG/5ML IJ SOLN
INTRAMUSCULAR | Status: DC | PRN
  Administered 2017-12-31: 2 mg via INTRAVENOUS

## 2017-12-31 MED ORDER — INSULIN ASPART 100 UNIT/ML ~~LOC~~ SOLN
SUBCUTANEOUS | Status: AC
  Filled 2017-12-31: qty 1

## 2017-12-31 MED ORDER — FENTANYL CITRATE (PF) 100 MCG/2ML IJ SOLN
INTRAMUSCULAR | Status: DC | PRN
  Administered 2017-12-31 (×2): 50 ug via INTRAVENOUS

## 2017-12-31 MED ORDER — TRAMADOL HCL 50 MG PO TABS
ORAL_TABLET | ORAL | Status: AC
  Filled 2017-12-31: qty 1

## 2017-12-31 MED ORDER — PROPOFOL 500 MG/50ML IV EMUL
INTRAVENOUS | Status: AC
  Filled 2017-12-31: qty 50

## 2017-12-31 MED ORDER — LIDOCAINE-EPINEPHRINE (PF) 1 %-1:200000 IJ SOLN
INTRAMUSCULAR | Status: DC | PRN
  Administered 2017-12-31: 5 mL

## 2017-12-31 MED ORDER — GLYCOPYRROLATE 0.2 MG/ML IJ SOLN
INTRAMUSCULAR | Status: DC | PRN
  Administered 2017-12-31: 0.2 mg via INTRAVENOUS

## 2017-12-31 SURGICAL SUPPLY — 36 items
"PENCIL ELECTRO HAND CTR " (MISCELLANEOUS) IMPLANT
CABLE TEST STIMULATION (UROLOGICAL SUPPLIES) ×1 IMPLANT
COVER BACK TABLE 60X90IN (DRAPES) ×2 IMPLANT
COVER MAYO STAND STRL (DRAPES) ×2 IMPLANT
COVER PROBE FLX POLY STRL (MISCELLANEOUS) ×2 IMPLANT
DERMABOND ADVANCED (GAUZE/BANDAGES/DRESSINGS) ×1
DERMABOND ADVANCED .7 DNX12 (GAUZE/BANDAGES/DRESSINGS) ×1 IMPLANT
DRAPE C-ARM 42X72 X-RAY (DRAPES) ×2 IMPLANT
DRAPE INCISE IOBAN 66X45 STRL (DRAPES) ×2 IMPLANT
DRAPE LAPAROSCOPIC ABDOMINAL (DRAPES) ×2 IMPLANT
DRSG TEGADERM 4X4.75 (GAUZE/BANDAGES/DRESSINGS) ×2 IMPLANT
DRSG TELFA 3X8 NADH (GAUZE/BANDAGES/DRESSINGS) ×2 IMPLANT
ELECT REM PT RETURN 9FT ADLT (ELECTROSURGICAL) ×2
ELECTRODE REM PT RTRN 9FT ADLT (ELECTROSURGICAL) IMPLANT
GLOVE BIO SURGEON STRL SZ7.5 (GLOVE) ×2 IMPLANT
GOWN STRL REUS W/ TWL LRG LVL3 (GOWN DISPOSABLE) ×1 IMPLANT
GOWN STRL REUS W/ TWL XL LVL3 (GOWN DISPOSABLE) ×1 IMPLANT
GOWN STRL REUS W/TWL LRG LVL3 (GOWN DISPOSABLE) ×1
GOWN STRL REUS W/TWL XL LVL3 (GOWN DISPOSABLE) ×1
KIT TURNOVER CYSTO (KITS) ×2 IMPLANT
NEUROSTIMULATOR 1.7X2X.06 (UROLOGICAL SUPPLIES) ×1 IMPLANT
PACK BASIN MINOR ARMC (MISCELLANEOUS) ×2 IMPLANT
PAD DRESSING TELFA 3X8 NADH (GAUZE/BANDAGES/DRESSINGS) ×1 IMPLANT
PENCIL ELECTRO HAND CTR (MISCELLANEOUS) ×2 IMPLANT
PROGRAMMER ANTENNA EXT (UROLOGICAL SUPPLIES) ×1 IMPLANT
PROGRAMMER STIMUL 2.2X1.1X3.7 (UROLOGICAL SUPPLIES) ×1 IMPLANT
STIMULATOR INTERSTIM 2X1.7X.3 (Orthopedic Implant) ×2 IMPLANT
SUT VIC AB 3-0 SH 27 (SUTURE) ×1
SUT VIC AB 3-0 SH 27X BRD (SUTURE) ×1 IMPLANT
SUT VIC AB 4-0 PS2 18 (SUTURE) ×2 IMPLANT
SUT VIC AB 4-0 RB1 27 (SUTURE) ×3
SUT VIC AB 4-0 RB1 27X BRD (SUTURE) ×3 IMPLANT
SYR BULB IRRIG 60ML STRL (SYRINGE) ×2 IMPLANT
SYR CONTROL 10ML (SYRINGE) ×1 IMPLANT
TOWEL OR 17X26 4PK STRL BLUE (TOWEL DISPOSABLE) ×4 IMPLANT
TUBING CONNECTING 10 (TUBING) ×1 IMPLANT

## 2017-12-31 NOTE — Interval H&P Note (Signed)
History and Physical Interval Note:  12/31/2017 11:16 AM  [name]  has presented today for surgery, with the diagnosis of malfunctioning InterStim, refractory urge incontinence  The various methods of treatment have been discussed with the patient and family. After consideration of risks, benefits and other options for treatment, the patient has consented to  Procedure(s): INTERSTIM-GENERATOR CHANGE (N/A) INTERSTIM-LEAD removal & replacement with impedence check (N/A) as a surgical intervention .  [name] history has been reviewed, patient examined, no change in status, stable for surgery.  I have reviewed the patient's chart and labs.  Questions were answered to the patient's satisfaction.     Polina Burmaster A

## 2017-12-31 NOTE — Interval H&P Note (Signed)
History and Physical Interval Note:  12/31/2017 11:16 AM  [name]  has presented today for surgery, with the diagnosis of malfunctioning InterStim, refractory urge incontinence  The various methods of treatment have been discussed with the patient and family. After consideration of risks, benefits and other options for treatment, the patient has consented to  Procedure(s): INTERSTIM-GENERATOR CHANGE (N/A) INTERSTIM-LEAD removal & replacement with impedence check (N/A) as a surgical intervention .  [name] history has been reviewed, patient examined, no change in status, stable for surgery.  I have reviewed the patient's chart and labs.  Questions were answered to the patient's satisfaction.     Kenson Groh A

## 2017-12-31 NOTE — Anesthesia Preprocedure Evaluation (Signed)
Anesthesia Evaluation  Patient identified by MRN, date of birth, ID band Patient awake    Reviewed: Allergy & Precautions, H&P , NPO status , Patient's Chart, lab work & pertinent test results, reviewed documented beta blocker date and time   Airway Mallampati: III  TM Distance: >3 FB Neck ROM: full    Dental no notable dental hx. (+) Teeth Intact   Pulmonary neg pulmonary ROS, shortness of breath and with exertion, asthma , sleep apnea and Continuous Positive Airway Pressure Ventilation , pneumonia, COPD,    Pulmonary exam normal breath sounds clear to auscultation       Cardiovascular Exercise Tolerance: Poor hypertension, On Medications + CAD, + Past MI, + Peripheral Vascular Disease and +CHF  negative cardio ROS  + dysrhythmias  Rhythm:regular Rate:Normal     Neuro/Psych  Headaches, PSYCHIATRIC DISORDERS Anxiety Depression Bipolar Disorder Schizophrenia  Neuromuscular disease negative neurological ROS  negative psych ROS   GI/Hepatic negative GI ROS, Neg liver ROS, GERD  Medicated,(+) Hepatitis -  Endo/Other  negative endocrine ROSdiabetes, Poorly Controlled, Type 1, Insulin DependentHypothyroidism   Renal/GU CRFRenal disease     Musculoskeletal   Abdominal   Peds  Hematology negative hematology ROS (+)   Anesthesia Other Findings   Reproductive/Obstetrics negative OB ROS                             Anesthesia Physical Anesthesia Plan  ASA: IV  Anesthesia Plan: MAC   Post-op Pain Management:    Induction:   PONV Risk Score and Plan: 3  Airway Management Planned:   Additional Equipment:   Intra-op Plan:   Post-operative Plan:   Informed Consent: I have reviewed the patients History and Physical, chart, labs and discussed the procedure including the risks, benefits and alternatives for the proposed anesthesia with the patient or authorized representative who has indicated  his/her understanding and acceptance.     Plan Discussed with: CRNA  Anesthesia Plan Comments:         Anesthesia Quick Evaluation

## 2017-12-31 NOTE — H&P (Signed)
HPI:  [name]:  Patient is a 52 year old female with a history of PVD, diabetes, IC, CKD stage III, bipolar disorder, OAB status post InterStim placement in 2011.  2017:  She reports that the device was placed by Dr. Davis Gourd. She reports excellent management of her urinary symptoms of incontinence and frequency since the device was placed until the last few months. She has not had her device interrogated since 2011. She thinks her battery may have died.  The patient had InterStim in 2011 for urge incontinence and enuresis wearing 4-5 pads a day and right lower quadrant discomfort. She said she was dry and not wearing pads until November  He currently leaks with coughing sneezing bending and lifting. She now has urge incontinence and enuresis wearing 3 pads a day moderately wet. She'll leak when she goes from a sitting to standing position. Voiding every hour. She gets up 2-3 times a night. She now has her right lower quadrant pain.  She says when she would turn the device up she feels it over the device and not in the vagina.  Today  When I saw the patient in February 2017 the device x-rays looked in good position. It was interrogated and the impedance check was normal. She turned the device up she felt it vaginally. She had good battery life then. She recently saw Larene Beach and had a KUB November 27, 2017. Impedance check was done and bladder was found to be dead. She has been having some vaginal discomfort apparently with intercourse and has had a previous bladder suspension.  The patient thinks her InterStim was working beautifully until several weeks ago. She reported being almost completely dry. Now she has urge incontinence and milder stress incontinence. She does not wear pads because she says they irritate her. She changes her close several times. She ranks the incontinence is milder. She is now getting up 4 times at night and set up twice. She can void every 15-20 minutes and cannot hold it for 2  hours. She feels discomfort or pressure over the IPG. She is allergic to amoxicillin  Modifying factors: There are no other modifying factors  Associated signs and symptoms: There are no other associated signs and symptoms  Aggravating and relieving factors: There are no other aggravating or relieving factors  Severity: Moderate  Duration: Persistent  PMH:      Past Medical History:  Diagnosis Date  . Asthma   . Bipolar depression ([place])   . CHF (congestive heart failure) ([place])   . Chronic headaches   . COPD (chronic obstructive pulmonary disease) ([place])   . Diabetic neuropathy ([place])   . GERD (gastroesophageal reflux disease)   . Headache   . Heart attack ([place])   . Hyperlipidemia   . Hypertension   . Hypomagnesemia   . Hypothyroid   . Irregular heart rhythm   . Liver lesion    Favored to be benign on CT; MRI of abd w/contract in Aug 2016. Liver Biopsy-Negative, March 2016  . Morbid obesity ([place])   . OSA (obstructive sleep apnea)   . Pinched nerve    Back  . Pneumonia   . RLS (restless legs syndrome)   . Seasonal allergies   . Sleep apnea   . Type 2 diabetes, uncontrolled, with neuropathy ([place]) 10/15/2015   Overview: Microalbumin 7.7 12/2010: Hgb A1c 10.2% 12/2010 and 9.7% 03/2011, eye exam 10/2009 [facility]   . Urinary incontinence   . Vitamin D deficiency    Surgical History:  Past Surgical History:  Procedure Laterality Date  . ABDOMINAL HYSTERECTOMY    . ABDOMINAL HYSTERECTOMY     Partial  . BLADDER SURGERY     x 2  . CHOLECYSTECTOMY    . LIVER BIOPSY  March 2016   Negative  . LUNG BIOPSY    . TUBAL LIGATION     Home Medications:       Allergies as of 12/10/2017      Reactions   Morphine And Related    anaphylaxis   Codeine    REACTION: itch, rash   Codeine    itching   Hydrocodone-acetaminophen    REACTION: rash   Mirtazapine    REACTION: closes throat   Morphine And Related    Sulfamethoxazole-trimethoprim    REACTION: Whelps all over    Vicodin [hydrocodone-acetaminophen]    itching                 Medication List              Accurate as of 12/10/17 10:15 AM. Always use your most recent med list.           albuterol 108 (90 Base) MCG/ACT inhaler  Commonly known as: PROVENTIL HFA;VENTOLIN HFA  Inhale 2 puffs into the lungs every 4 (four) hours as needed for wheezing or shortness of breath.   amLODipine 2.5 MG tablet  Commonly known as: NORVASC  Take 2.5 mg by mouth daily.   aspirin EC 81 MG tablet  Take 1 tablet (81 mg total) by mouth daily.   atorvastatin 40 MG tablet  Commonly known as: LIPITOR  Take 1 tablet (40 mg total) by mouth at bedtime.   budesonide-formoterol 160-4.5 MCG/ACT inhaler  Commonly known as: SYMBICORT  Inhale 2 puffs into the lungs 2 (two) times daily.   cholecalciferol 1000 units tablet  Commonly known as: VITAMIN D  Take 1,000 Units by mouth daily.   DULoxetine 60 MG capsule  Commonly known as: CYMBALTA  Take 120 mg by mouth daily.   ezetimibe 10 MG tablet  Commonly known as: ZETIA  Take 10 mg by mouth daily.   fenofibrate 145 MG tablet  Commonly known as: TRICOR  Take 145 mg by mouth daily.   fenofibrate micronized 134 MG capsule  Commonly known as: LOFIBRA   fluticasone 50 MCG/ACT nasal spray  Commonly known as: FLONASE  Place 1 spray into both nostrils daily.   gabapentin 300 MG capsule  Commonly known as: NEURONTIN  Take 1 capsule (300 mg total) by mouth 2 (two) times daily.   HUMULIN R U-500 KWIKPEN 500 UNIT/ML kwikpen  Generic drug: insulin regular human CONCENTRATED  Inject 60 Units as directed 3 (three) times daily.   hydrOXYzine 50 MG capsule  Commonly known as: VISTARIL  Take 50 mg by mouth 2 (two) times daily as needed.   ibuprofen 600 MG tablet  Commonly known as: ADVIL,MOTRIN  Take 1 tablet (600 mg total) by mouth every 8 (eight) hours as needed for headache, mild pain or moderate pain. With food   insulin aspart 100 UNIT/ML injection  Commonly known as:  novoLOG  Inject into the skin 3 (three) times daily before meals.   JARDIANCE 25 MG Tabs tablet  Generic drug: empagliflozin  Take 25 mg by mouth daily.   lamoTRIgine 25 MG tablet  Commonly known as: LAMICTAL  Take 25 mg by mouth 2 (two) times daily.   lamoTRIgine 200 MG tablet  Commonly known as: LAMICTAL  Take  200 mg daily by mouth.   levofloxacin 500 MG tablet  Commonly known as: LEVAQUIN  Take 1 tablet (500 mg total) by mouth daily.   levothyroxine 75 MCG tablet  Commonly known as: SYNTHROID, LEVOTHROID  Take 1 tablet (75 mcg total) by mouth daily.   linaclotide 72 MCG capsule  Commonly known as: LINZESS  Take 1 capsule (72 mcg total) by mouth daily before breakfast.   loratadine 10 MG tablet  Commonly known as: CLARITIN  Take 1 tablet (10 mg total) by mouth daily.   magnesium oxide 400 MG tablet  Commonly known as: MAG-OX  Take 400 mg by mouth daily.   metoprolol tartrate 25 MG tablet  Commonly known as: LOPRESSOR  Take 1 tablet (25 mg total) by mouth 2 (two) times daily.   ONETOUCH VERIO test strip  Generic drug: glucose blood   oseltamivir 75 MG capsule  Commonly known as: TAMIFLU  Take 1 capsule (75 mg total) by mouth 2 (two) times daily.   pantoprazole 40 MG tablet  Commonly known as: PROTONIX  Take 1 tablet (40 mg total) by mouth daily.   Prasterone 6.5 MG Inst  Commonly known as: INTRAROSA  Place 1 suppository vaginally daily.   prochlorperazine 5 MG tablet  Commonly known as: COMPAZINE  Take 1 tablet (5 mg total) by mouth every 6 (six) hours as needed for nausea or vomiting.   QUEtiapine 300 MG tablet  Commonly known as: SEROQUEL  Take 300 mg by mouth 2 (two) times daily.   traMADol 50 MG tablet  Commonly known as: ULTRAM  Take 1 tablet (50 mg total) by mouth every 6 (six) hours as needed.   traZODone 50 MG tablet  Commonly known as: DESYREL  Take 50 mg by mouth 2 (two) times daily.   TRULICITY 1.5 MB/5.5HR Sopn  Generic drug: Dulaglutide  1.5 mg once  a week.   ULTICARE SHORT PEN NEEDLES 31G X 8 MM Misc  Generic drug: Insulin Pen Needle       Allergies:       Allergies  Allergen Reactions  . Morphine And Related     anaphylaxis  . Codeine     REACTION: itch, rash  . Codeine     itching  . Hydrocodone-Acetaminophen     REACTION: rash  . Mirtazapine     REACTION: closes throat  . Morphine And Related   . Sulfamethoxazole-Trimethoprim     REACTION: Whelps all over  . Vicodin [Hydrocodone-Acetaminophen]     itching   Family History:       Family History  Problem Relation Age of Onset  . Diabetes Mother   . Heart disease Mother   . Hyperlipidemia Mother   . Hypertension Mother   . Kidney disease Mother   . Mental illness Mother   . Hypothyroidism Mother   . Stroke Mother   . Osteoporosis Mother   . Glaucoma Mother   . Congestive Heart Failure Mother   . Hypertension Father   . Asthma Daughter   . Cancer Daughter    throat  . Bipolar disorder Daughter   . Heart disease Maternal Grandfather   . Rheum arthritis Maternal Grandfather   . Cancer Maternal Grandfather    liver  . Kidney disease Maternal Grandfather   . Cancer Paternal Grandmother    lung  . Kidney disease Brother   . Breast cancer Maternal Grandmother 60  . Bipolar disorder Daughter   . Hypertension Daughter   . Restless legs  syndrome Daughter    Social History: reports that she has never smoked. She has never used smokeless tobacco. She reports that she does not drink alcohol or use drugs.  ROS:               Physical Exam:  BP 133/84 (BP Location: Right Arm, Patient Position: Sitting, Cuff Size: Normal)  Pulse 71  Ht 4' 11"  (1.499 m)  Wt 195 lb (88.5 kg)  BMI 39.39 kg/m  Constitutional: Alert and oriented, No acute distress.  HEENT: [place] AT, moist mucus membranes. Trachea midline, no masses.  Cardiovascular: No clubbing, cyanosis, or edema.  Respiratory: Normal respiratory effort, no increased work of breathing.  GI: Abdomen  is soft, nontender, nondistended, no abdominal masses  GU: No evidence of infection. Well-healed incision in right buttock and just right of midline.  Skin: No rashes, bruises or suspicious lesions.  Lymph: No cervical or inguinal adenopathy.  Neurologic: Grossly intact, no focal deficits, moving all 4 extremities.  Psychiatric: Normal mood and affect.  Laboratory Data:  Recent Labs                                               Recent Labs                       Recent Labs     Recent Labs     Recent Labs                       Urinalysis  Labs (Brief)                                                                                                                                               Pertinent Imaging:  None  Assessment & Plan: I reviewed the replacement of the IPG and possibly the lead with her. She understands that the lead will be tested intraoperatively. It is now a number of years old. If it was replaced we talked about a retained tip and sequelae. We talked about increased infection rates. We talked about efficacy especially if the lead is change in the position may not be exactly the same.  She is trying the cream for the vaginal discomfort or irritation. She is being treated for vaginal dryness. She understands that I felt that the right lower quadrant but more the right inguinal discomfort was not related to her InterStim or bladder. We will proceed accordingly  Pros and cons and risks utilizing my usual template was discussed    After a thorough review of the management options for the patient's condition the patient  elected to proceed with surgical therapy as noted above. We have discussed the potential benefits  and risks of the procedure, side effects of the proposed treatment, the likelihood of the patient achieving the goals of the procedure, and any potential problems that might occur during the procedure or recuperation.  Informed consent has been obtained.

## 2017-12-31 NOTE — Transfer of Care (Signed)
Immediate Anesthesia Transfer of Care Note  Patient: [name]  Procedure(s) Performed: INTERSTIM-GENERATOR CHANGE (N/A ) INTERSTIM-LEAD removal & replacement with impedence check (N/A )  Patient Location: PACU  Anesthesia Type:General  Level of Consciousness: awake and alert   Airway & Oxygen Therapy: Patient Spontanous Breathing and Patient connected to nasal cannula oxygen  Post-op Assessment: Report given to RN and Post -op Vital signs reviewed and stable  Post vital signs: Reviewed  Last Vitals:  Vitals Value Taken Time  BP 133/85 12/31/2017  1:10 PM  Temp    Pulse 88 12/31/2017  1:10 PM  Resp 20 12/31/2017  1:10 PM  SpO2 96 % 12/31/2017  1:10 PM    Last Pain:  Vitals:   12/31/17 0944  TempSrc: Temporal  PainSc: 0-No pain         Complications: No apparent anesthesia complications

## 2017-12-31 NOTE — Anesthesia Post-op Follow-up Note (Signed)
Anesthesia QCDR form completed.        

## 2017-12-31 NOTE — H&P (Signed)
No change in LUTS Cardiac HS normal Breaths clear

## 2017-12-31 NOTE — Discharge Instructions (Signed)
I have reviewed discharge instructions in detail with the patient. They will follow-up with me or their physician as scheduled. My nurse will also be calling the patients as per protocol.   AMBULATORY SURGERY  DISCHARGE INSTRUCTIONS   1) The drugs that you were given will stay in your system until tomorrow so for the next 24 hours you should not:  A) Drive an automobile B) Make any legal decisions C) Drink any alcoholic beverage   2) You may resume regular meals tomorrow.  Today it is better to start with liquids and gradually work up to solid foods.  You may eat anything you prefer, but it is better to start with liquids, then soup and crackers, and gradually work up to solid foods.   3) Please notify your doctor immediately if you have any unusual bleeding, trouble breathing, redness and pain at the surgery site, drainage, fever, or pain not relieved by medication.    4) Additional Instructions:        Please contact your physician with any problems or Same Day Surgery at [phone], Monday through Friday 6 am to 4 pm, or [place] at [facility] number at [phone].

## 2017-12-31 NOTE — Interval H&P Note (Signed)
History and Physical Interval Note:  12/31/2017 11:16 AM  [name]  has presented today for surgery, with the diagnosis of malfunctioning InterStim, refractory urge incontinence  The various methods of treatment have been discussed with the patient and family. After consideration of risks, benefits and other options for treatment, the patient has consented to  Procedure(s): INTERSTIM-GENERATOR CHANGE (N/A) INTERSTIM-LEAD removal & replacement with impedence check (N/A) as a surgical intervention .  [name] history has been reviewed, patient examined, no change in status, stable for surgery.  I have reviewed the patient's chart and labs.  Questions were answered to the patient's satisfaction.     Yusra Ravert A

## 2017-12-31 NOTE — Op Note (Signed)
Preoperative diagnosis: Refractory urgency incontinence and malfunctioning InterStim Postoperative diagnosis: Refractory urgency incontinence of malfunctioning InterStim Surgery: Replacement of IPG and impedance check Surgeon: Dr. Nicki Reaper Brydan Downard  The patient has the above diagnosis and consented to the above procedure.  Extra care was taken with positioning the patient in the prone position.  Fluoroscopy was utilized.  Skin preparation as per protocol performed.  Preoperative antibiotics given.  An AP x-ray demonstrated the lead in the S3 foramina and look within normal limits in the IPG.  I instilled 10 cc of the lidocaine epinephrine mixture at the level of the right buttock incision.  I opened the incision with a scalpel and dissected down with scalpel and cutting current opening a pseudocapsule and not jeopardizing the lead.  It was easily delivered.  It was disconnected from the IPG.  The lead look normal and clean  We then tested all 4 positions on the lead and she had excellent bellows and toe responses at very low settings.  The new IPG was attached with screwdriver and the device was placed back into the already form pseudocapsule easily.  Impedance check was performed and impedance was normal in all 4-lead positions  I closed the subcutaneous tissue with running 3-0 Vicryl followed by 4-0 subcuticular in the sterile dressing  Hopefully the patient will reach her treatment goal

## 2018-01-01 ENCOUNTER — Other Ambulatory Visit: Payer: Self-pay | Admitting: Pathology

## 2018-01-01 ENCOUNTER — Encounter: Payer: Self-pay | Admitting: Urology

## 2018-01-01 LAB — SURGICAL PATHOLOGY

## 2018-01-04 NOTE — Anesthesia Postprocedure Evaluation (Signed)
Anesthesia Post Note  Patient: [name]  Procedure(s) Performed: INTERSTIM-GENERATOR CHANGE (N/A ) INTERSTIM-LEAD removal & replacement with impedence check (N/A )  Patient location during evaluation: PACU Anesthesia Type: General Level of consciousness: awake and alert Pain management: pain level controlled Vital Signs Assessment: post-procedure vital signs reviewed and stable Respiratory status: spontaneous breathing, nonlabored ventilation, respiratory function stable and patient connected to nasal cannula oxygen Cardiovascular status: blood pressure returned to baseline and stable Postop Assessment: no apparent nausea or vomiting Anesthetic complications: no     Last Vitals:  Vitals:   12/31/17 1401 12/31/17 1428  BP: 140/84 128/70  Pulse: 88 88  Resp: 16 18  Temp:    SpO2: 92% 96%    Last Pain:  Vitals:   12/31/17 1431  TempSrc:   PainSc: 7                  Molli Barrows

## 2018-01-14 ENCOUNTER — Encounter: Payer: Self-pay | Admitting: Urology

## 2018-01-14 ENCOUNTER — Ambulatory Visit: Payer: Self-pay | Admitting: Urology

## 2018-01-14 ENCOUNTER — Ambulatory Visit (INDEPENDENT_AMBULATORY_CARE_PROVIDER_SITE_OTHER): Payer: Medicare HMO | Admitting: Urology

## 2018-01-14 VITALS — BP 121/85 | HR 84 | Ht 59.52 in | Wt 186.6 lb

## 2018-01-14 DIAGNOSIS — B373 Candidiasis of vulva and vagina: Secondary | ICD-10-CM

## 2018-01-14 DIAGNOSIS — B3731 Acute candidiasis of vulva and vagina: Secondary | ICD-10-CM

## 2018-01-14 DIAGNOSIS — N3946 Mixed incontinence: Secondary | ICD-10-CM

## 2018-01-14 DIAGNOSIS — T85191A Other mechanical complication of implanted electronic neurostimulator (electrode) of peripheral nerve, initial encounter: Secondary | ICD-10-CM

## 2018-01-14 LAB — URINALYSIS, COMPLETE
Bilirubin, UA: NEGATIVE
LEUKOCYTES UA: NEGATIVE
Nitrite, UA: NEGATIVE
PH UA: 5.5 (ref 5.0–7.5)
Protein, UA: NEGATIVE
RBC, UA: NEGATIVE
SPEC GRAV UA: 1.01 (ref 1.005–1.030)
Urobilinogen, Ur: 0.2 mg/dL (ref 0.2–1.0)

## 2018-01-14 LAB — MICROSCOPIC EXAMINATION: RBC, UA: NONE SEEN /hpf (ref 0–2)

## 2018-01-14 MED ORDER — FLUCONAZOLE 100 MG PO TABS: 100.0000 mg | ORAL_TABLET | Freq: Every day | ORAL | 0 refills | Status: DC

## 2018-01-14 NOTE — Progress Notes (Signed)
01/14/2018 11:22 AM   [name] 1966-04-05 [id number]  Referring provider: Arnetha Courser, [address] [place], [place] [address]  No chief complaint on file.   HPI: When I saw the patient in February 2017 the device x-rays looked in good position.  It was interrogated and the impedance check was normal.  She turned the device up she felt it vaginally.  She had good battery life then.  She recently saw Larene Beach and had a KUB November 27, 2017.  Impedance check was done and bladder was found to be dead.  She has been having some vaginal discomfort apparently with intercourse and has had a previous bladder suspension.  The patient thinks her InterStim was working beautifully until several weeks ago.  She reported being almost completely dry.  Now she has urge incontinence and milder stress incontinence.  She does not wear pads because she says they irritate her.  She changes her close several times.  She ranks the incontinence is milder.  She is now getting up 4 times at night and set up twice.  She can void every 15-20 minutes and cannot hold it for 2 hours.  She feels discomfort or pressure over the IPG.  She is allergic to amoxicillin  Today December 31, 2017 the patient had the IPG change.  All 4-lead positions looked excellent with good Bellows and toe responses at very low amplitude.  We can see dramatically better.  Improved urge incontinence.  She still has mild stress incontinence and I educated her regarding this.  Incisions look normal.  Some basic teaching was provided by our nurses.    PMH: Past Medical History:  Diagnosis Date  . Anxiety   . Arthritis   . Asthma   . Bipolar depression ([place])   . CHF (congestive heart failure) ([place])   . Chronic headaches    migraines have lessened  . Chronic kidney disease    ddstage 111  . COPD (chronic obstructive pulmonary disease) ([place])   . Coronary artery disease   . Depression   . Diabetic neuropathy ([place])   .  Dyspnea    uses inhalers for this  . Dysrhythmia    rapid  . Fatty liver   . GERD (gastroesophageal reflux disease)   . Headache   . Heart attack ([place]) 2009  . Hyperlipidemia   . Hypertension   . Hypomagnesemia   . Hypothyroid   . Irregular heart rhythm   . Liver lesion    Favored to be benign on CT; MRI of abd w/contract in Aug 2016. Liver Biopsy-Negative, March 2016  . Morbid obesity ([place])   . OSA (obstructive sleep apnea)   . Peripheral vascular disease ([place])   . Pinched nerve    Back  . Pneumonia   . RLS (restless legs syndrome)   . Seasonal allergies   . Sleep apnea    uses oxygen at night 2L d/t cpap did not fit  . Type 2 diabetes, uncontrolled, with neuropathy ([place]) 10/15/2015   Overview:  Microalbumin 7.7 12/2010: Hgb A1c 10.2% 12/2010 and 9.7% 03/2011, eye exam 10/2009 [facility]   . Urinary incontinence   . Vitamin D deficiency     Surgical History: Past Surgical History:  Procedure Laterality Date  . ABDOMINAL HYSTERECTOMY    . ABDOMINAL HYSTERECTOMY     Partial  . bladder stim  2007  . BLADDER SURGERY     x 2. tacking of bladder and the other  was the stimulator placement  . CHOLECYSTECTOMY    . INTERSTIM-GENERATOR CHANGE N/A 12/31/2017   Procedure: INTERSTIM-GENERATOR CHANGE;  Surgeon: Bjorn Loser, MD;  Location: [facility] ORS;  Service: Urology;  Laterality: N/A;  . LIVER BIOPSY  March 2016   Negative  . LUNG BIOPSY  2015   small spot on lung  . TUBAL LIGATION      Home Medications:  Allergies as of 01/14/2018      Reactions   Mirtazapine Anaphylaxis   REACTION: closes throat   Morphine And Related    anaphylaxis   Sulfamethoxazole-trimethoprim Rash   REACTION: Whelps all over   Amoxicillin Er Nausea And Vomiting   Codeine Itching   REACTION: itch, rash   Hydrocodone-acetaminophen Rash   REACTION: rash   Prasterone Nausea And Vomiting      Medication List        Accurate as of 01/14/18 11:22 AM. Always use your most recent med list.           albuterol 108 (90 Base) MCG/ACT inhaler Commonly known as:  PROVENTIL HFA;VENTOLIN HFA Inhale 2 puffs into the lungs every 4 (four) hours as needed for wheezing or shortness of breath.   amLODipine 2.5 MG tablet Commonly known as:  NORVASC Take 2.5 mg by mouth daily.   aspirin EC 81 MG tablet Take 81 mg by mouth daily. Patient has stopped taking for surgery   atorvastatin 80 MG tablet Commonly known as:  LIPITOR Take 80 mg by mouth daily.   budesonide-formoterol 160-4.5 MCG/ACT inhaler Commonly known as:  SYMBICORT Inhale 2 puffs into the lungs 2 (two) times daily.   cholecalciferol 1000 units tablet Commonly known as:  VITAMIN D Take 1,000 Units by mouth daily.   DULoxetine 60 MG capsule Commonly known as:  CYMBALTA Take 120 mg by mouth daily.   ezetimibe 10 MG tablet Commonly known as:  ZETIA Take 10 mg by mouth daily.   fenofibrate 145 MG tablet Commonly known as:  TRICOR Take 145 mg by mouth daily.   fluticasone 50 MCG/ACT nasal spray Commonly known as:  FLONASE Place 1 spray into both nostrils daily as needed for allergies.   gabapentin 300 MG capsule Commonly known as:  NEURONTIN Take 1 capsule (300 mg total) by mouth 2 (two) times daily.   HUMULIN R U-500 KWIKPEN 500 UNIT/ML kwikpen Generic drug:  insulin regular human CONCENTRATED Inject 60 Units as directed 3 (three) times daily.   hydrOXYzine 50 MG capsule Commonly known as:  VISTARIL Take 50 mg by mouth 2 (two) times daily as needed.   insulin aspart 100 UNIT/ML injection Commonly known as:  novoLOG Inject 5-25 Units into the skin 3 (three) times daily before meals.   JARDIANCE 25 MG Tabs tablet Generic drug:  empagliflozin Take 25 mg by mouth daily.   lamoTRIgine 25 MG tablet Commonly known as:  LAMICTAL Take 25 mg by mouth 2 (two) times daily.   lamoTRIgine 200 MG tablet Commonly known as:  LAMICTAL Take 200 mg daily by mouth.   levothyroxine 75 MCG tablet Commonly known as:   SYNTHROID, LEVOTHROID Take 1 tablet (75 mcg total) by mouth daily.   linaclotide 72 MCG capsule Commonly known as:  LINZESS Take 1 capsule (72 mcg total) by mouth daily before breakfast.   metoprolol tartrate 25 MG tablet Commonly known as:  LOPRESSOR Take 1 tablet (25 mg total) by mouth 2 (two) times daily.   ondansetron 4 MG tablet Commonly known as:  ZOFRAN Take 1  tablet (4 mg total) by mouth every 8 (eight) hours as needed for nausea or vomiting.   ONETOUCH VERIO test strip Generic drug:  glucose blood   pantoprazole 40 MG tablet Commonly known as:  PROTONIX Take 1 tablet (40 mg total) by mouth daily.   Prasterone 6.5 MG Inst Commonly known as:  INTRAROSA Place 1 suppository vaginally daily.   prochlorperazine 5 MG tablet Commonly known as:  COMPAZINE Take 1 tablet (5 mg total) by mouth every 6 (six) hours as needed for nausea or vomiting.   QUEtiapine 300 MG tablet Commonly known as:  SEROQUEL Take 300 mg by mouth 2 (two) times daily.   traMADol 50 MG tablet Commonly known as:  ULTRAM Take 1 tablet (50 mg total) by mouth every 6 (six) hours as needed.   traZODone 50 MG tablet Commonly known as:  DESYREL Take 50 mg by mouth 2 (two) times daily.   TRULICITY 1.5 ZO/1.0RU Sopn Generic drug:  Dulaglutide 1.5 mg once a week.   ULTICARE SHORT PEN NEEDLES 31G X 8 MM Misc Generic drug:  Insulin Pen Needle       Allergies:  Allergies  Allergen Reactions  . Mirtazapine Anaphylaxis    REACTION: closes throat  . Morphine And Related     anaphylaxis  . Sulfamethoxazole-Trimethoprim Rash    REACTION: Whelps all over  . Amoxicillin Er Nausea And Vomiting  . Codeine Itching    REACTION: itch, rash  . Hydrocodone-Acetaminophen Rash    REACTION: rash  . Prasterone Nausea And Vomiting    Family History: Family History  Problem Relation Age of Onset  . Diabetes Mother   . Heart disease Mother   . Hyperlipidemia Mother   . Hypertension Mother   . Kidney  disease Mother   . Mental illness Mother   . Hypothyroidism Mother   . Stroke Mother   . Osteoporosis Mother   . Glaucoma Mother   . Congestive Heart Failure Mother   . Hypertension Father   . Asthma Daughter   . Cancer Daughter        throat  . Bipolar disorder Daughter   . Heart disease Maternal Grandfather   . Rheum arthritis Maternal Grandfather   . Cancer Maternal Grandfather        liver  . Kidney disease Maternal Grandfather   . Cancer Paternal Grandmother        lung  . Kidney disease Brother   . Breast cancer Maternal Grandmother 60  . Bipolar disorder Daughter   . Hypertension Daughter   . Restless legs syndrome Daughter     Social History:  reports that she has never smoked. She has never used smokeless tobacco. She reports that she does not drink alcohol or use drugs.  ROS:                                        Physical Exam: There were no vitals taken for this visit.    Laboratory Data: Lab Results  Component Value Date   WBC 8.8 12/04/2017   HGB 14.6 12/04/2017   HCT 45.9 12/04/2017   MCV 83.9 12/04/2017   PLT 366 12/04/2017    Lab Results  Component Value Date   CREATININE 1.03 12/17/2017    No results found for: PSA  No results found for: TESTOSTERONE  Lab Results  Component Value Date   HGBA1C 8.9 11/10/2017  Urinalysis    Component Value Date/Time   COLORURINE YELLOW (A) 12/04/2017 1429   APPEARANCEUR CLEAR (A) 12/04/2017 1429   APPEARANCEUR Clear 11/27/2017 1029   LABSPEC 1.030 12/04/2017 1429   LABSPEC 1.026 10/09/2014 1808   PHURINE 5.0 12/04/2017 1429   GLUCOSEU >=500 (A) 12/04/2017 1429   GLUCOSEU >=500 10/09/2014 1808   HGBUR NEGATIVE 12/04/2017 1429   HGBUR moderate 01/04/2009 0913   BILIRUBINUR NEGATIVE 12/04/2017 1429   BILIRUBINUR Negative 11/27/2017 1029   BILIRUBINUR Negative 10/09/2014 1808   KETONESUR 80 (A) 12/04/2017 1429   PROTEINUR NEGATIVE 12/04/2017 1429   UROBILINOGEN  negative (A) 09/10/2017 1147   UROBILINOGEN 0.2 01/04/2009 0913   NITRITE NEGATIVE 12/04/2017 1429   LEUKOCYTESUR NEGATIVE 12/04/2017 1429   LEUKOCYTESUR Negative 11/27/2017 1029   LEUKOCYTESUR Negative 10/09/2014 1808    Pertinent Imaging: None  Assessment & Plan: Reassess in 2 months.  Patient request I gave her 3 days of Diflucan 100 mg  There are no diagnoses linked to this encounter.  No follow-ups on file.  Reece Packer, MD  [facility] [address], [place], [place] [address] [phone]

## 2018-01-16 ENCOUNTER — Telehealth: Payer: Self-pay | Admitting: Urology

## 2018-01-16 NOTE — Telephone Encounter (Signed)
Patient was seen by Dr. Matilde Sprang on 5/13.  She is having trouble with her Interstim device.  She was asked to go home and charge the device, but it is still not working.  Please contact patient and advise of next steps.  She can be reached at [phone].

## 2018-01-19 IMAGING — CT CT ABD-PELV W/ CM
2 of 5 series · 16 of 46 positions shown, 18 images · IV contrast (APPLIED)
Comparison: CT abdomen pelvis 05/04/2016.

CLINICAL DATA: Patient with lower back and lower abdominal pain.
Nausea and diarrhea. Prior cholecystectomy.

EXAM:
CT ABDOMEN AND PELVIS WITH CONTRAST
TECHNIQUE: Multidetector CT imaging of the abdomen and pelvis was performed
using the standard protocol following bolus administration of
intravenous contrast.
CONTRAST:  100mL WINNV0-477 IOPAMIDOL (WINNV0-477) INJECTION 61%

[Series 2: axial st · axial · 0.77mm/px · z∈[-863,-443]mm · 13 of 94 slices shown, 15 images]
[im 5/94  soft-tissue]
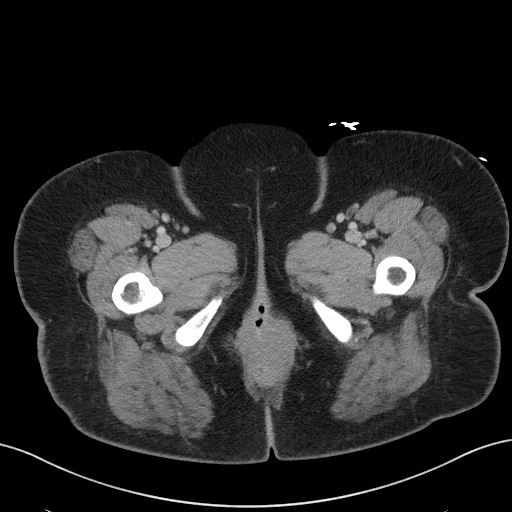
[im 5/94  bone]
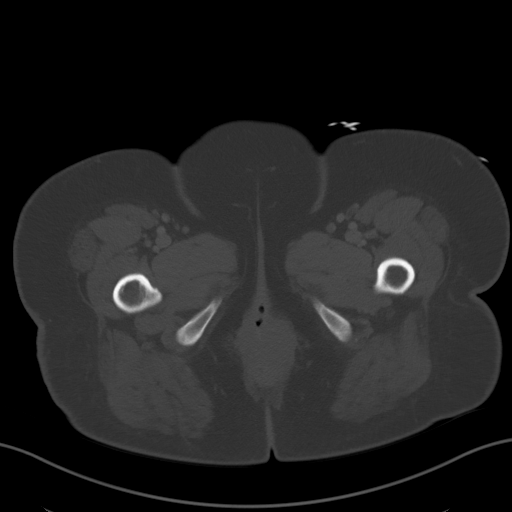
[im 15/94  soft-tissue]
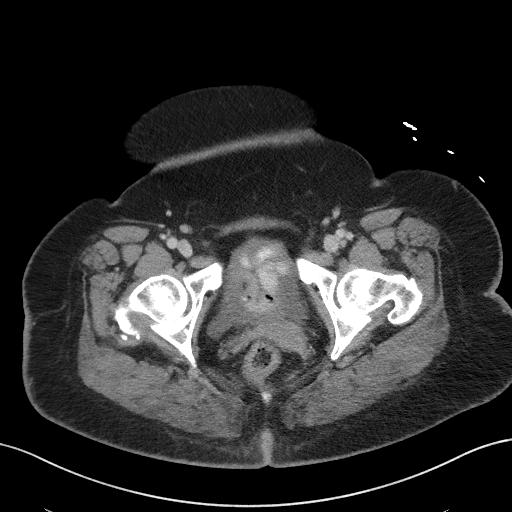
[im 20/94  soft-tissue]
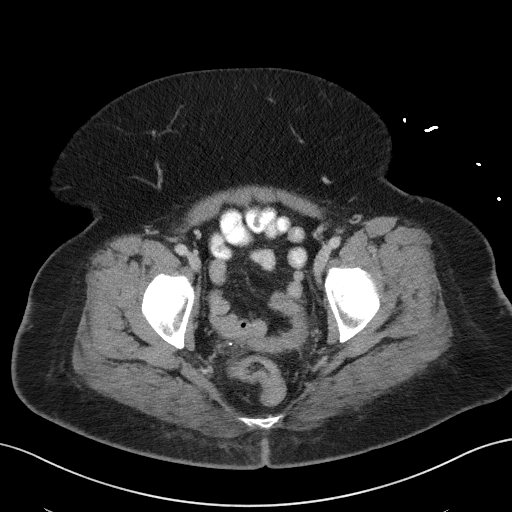
[im 25/94  soft-tissue]
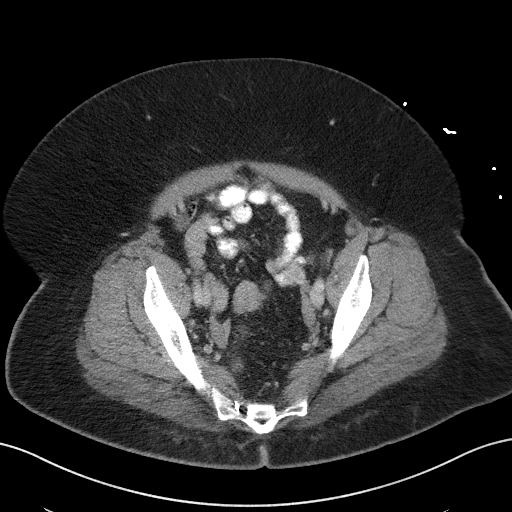
[im 35/94  soft-tissue]
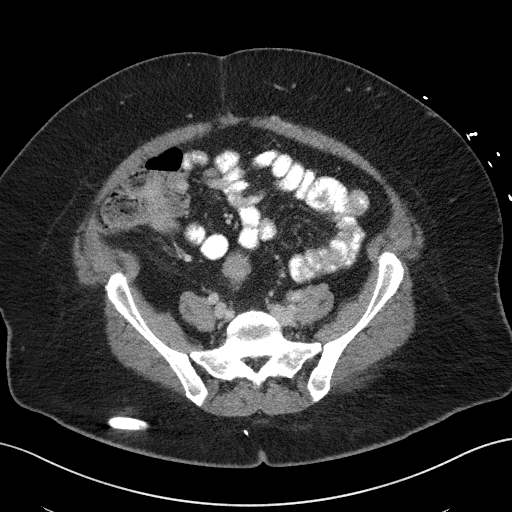
[im 40/94  soft-tissue]
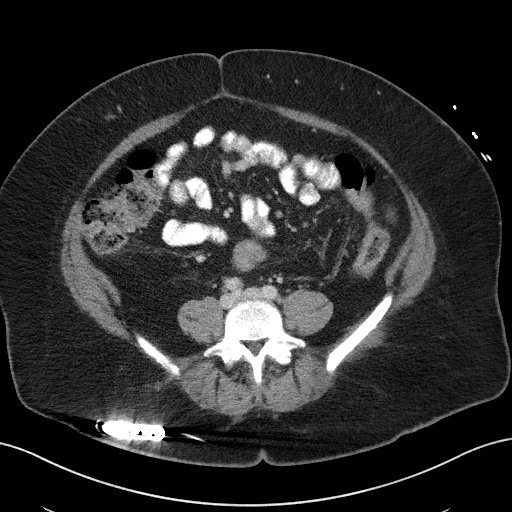
[im 49/94  soft-tissue]
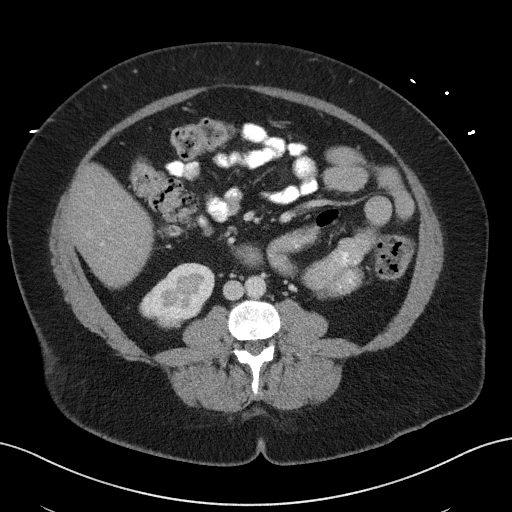
[im 54/94  soft-tissue]
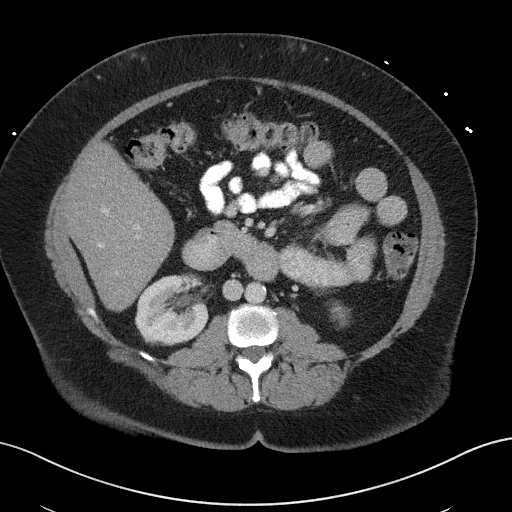
[im 59/94  soft-tissue]
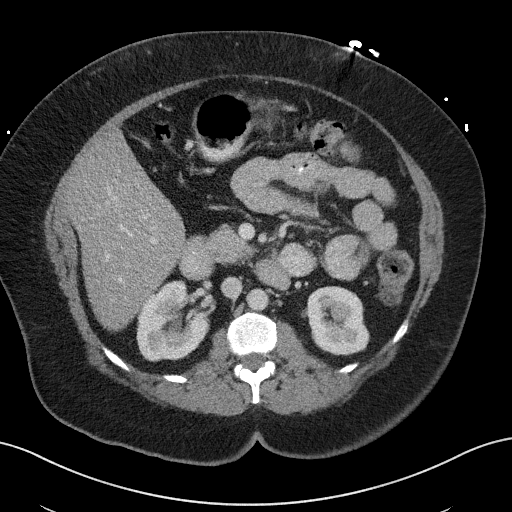
[im 59/94  bone]
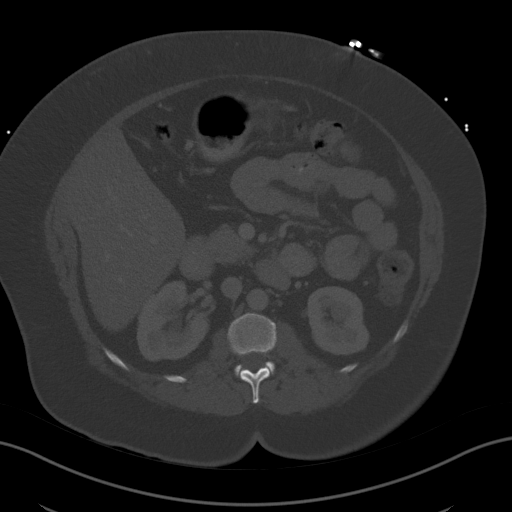
[im 69/94  soft-tissue]
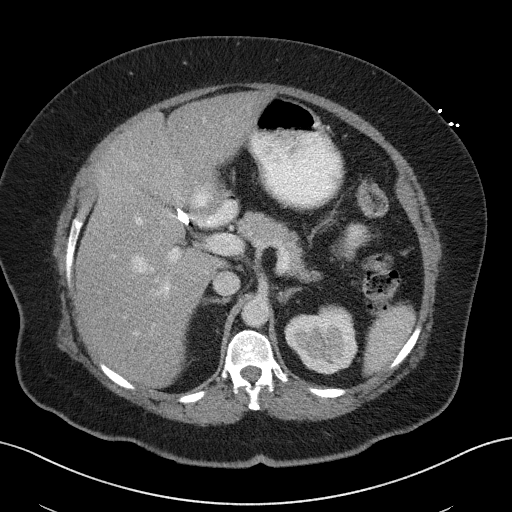
[im 74/94  soft-tissue]
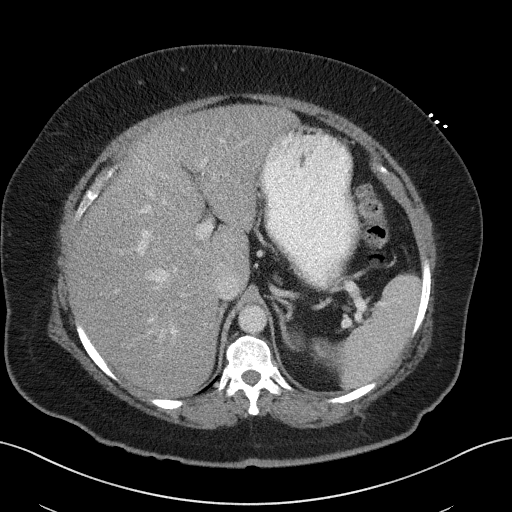
[im 79/94  soft-tissue]
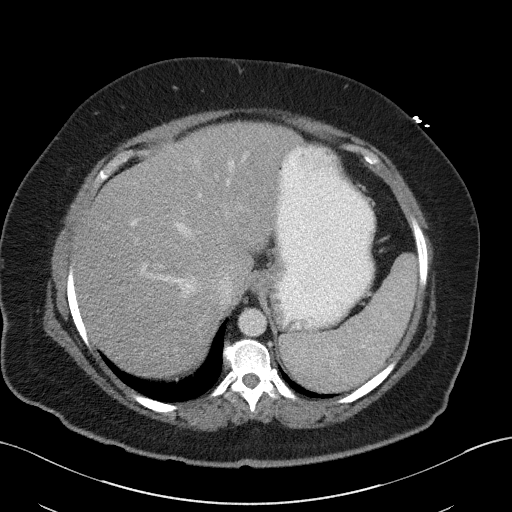
[im 89/94  soft-tissue]
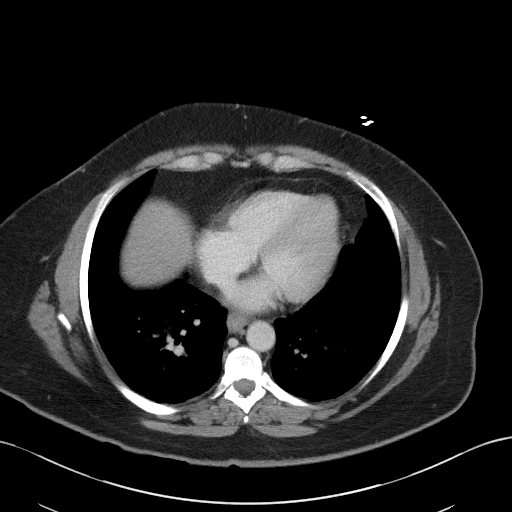

[Series 5: coronal st · coronal · 0.68mm/px · 3 of 107 slices shown]
[im 36/107  soft-tissue]
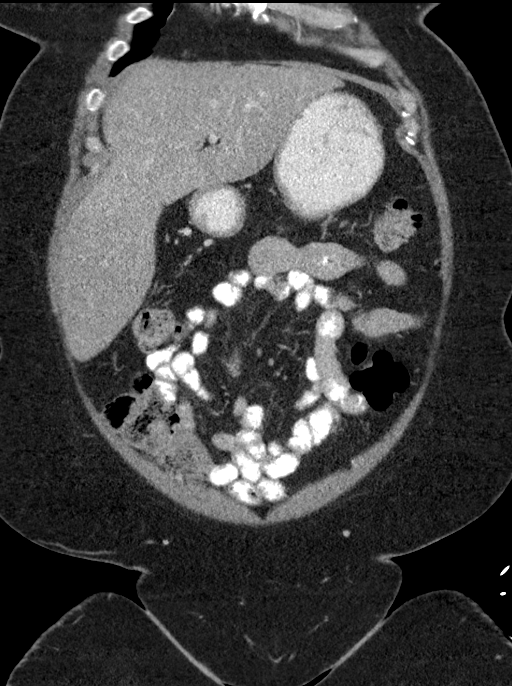
[im 48/107  soft-tissue]
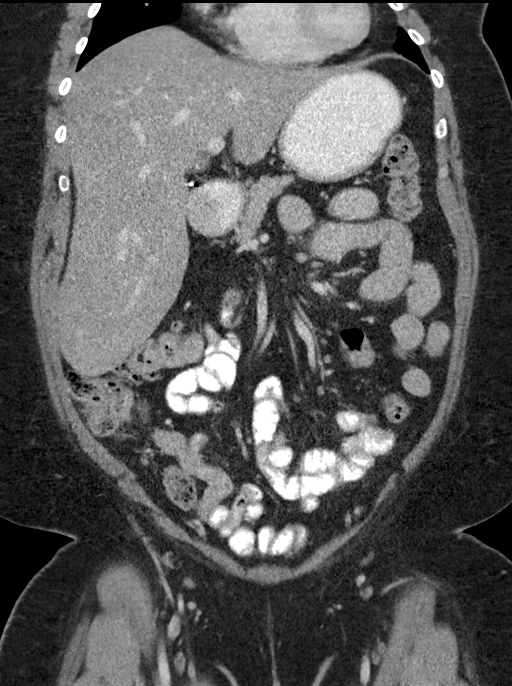
[im 59/107  soft-tissue]
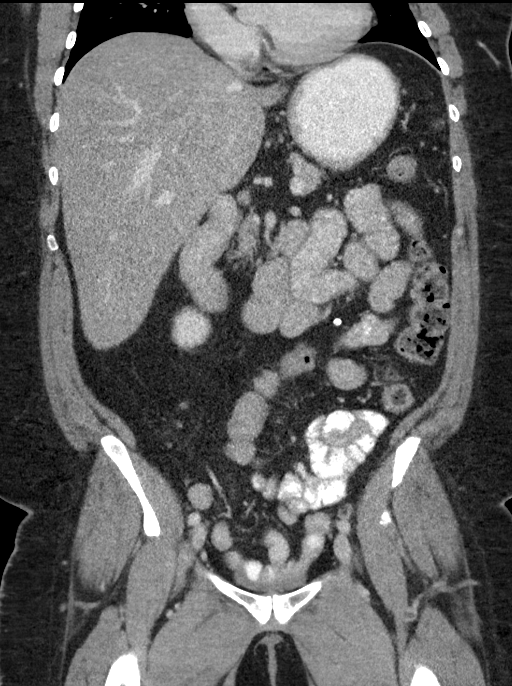

[16 of 46 positions shown; findings below may reference images not displayed]

FINDINGS: Lower chest: Heart is enlarged. Minimal dependent atelectasis within
the bilateral lower lobes. No pleural effusion.

Hepatobiliary: Liver is normal in size and contour. No focal lesion
is identified. Gallbladder surgically absent.

Pancreas: Unremarkable

Spleen: Unremarkable

Adrenals/Urinary Tract: Normal adrenal glands. Kidneys are lobular
in contour. No hydronephrosis. Urinary bladder is decompressed.

Stomach/Bowel: No abnormal bowel wall thickening or evidence for
bowel obstruction. Normal appendix. No free fluid or free
intraperitoneal air. Normal morphology of the stomach.

Vascular/Lymphatic: Normal caliber abdominal aorta. No
retroperitoneal lymphadenopathy.

Reproductive: Uterus and adnexal structures are unremarkable.

Other: None.

Musculoskeletal: Lumbar spine degenerative changes. No aggressive or
acute appearing osseous lesions. Spinal stimulator device.
IMPRESSION: No acute process in the abdomen or pelvis.

## 2018-01-21 ENCOUNTER — Ambulatory Visit (INDEPENDENT_AMBULATORY_CARE_PROVIDER_SITE_OTHER): Payer: Medicare HMO

## 2018-01-21 VITALS — BP 110/72 | HR 64

## 2018-01-21 DIAGNOSIS — R32 Unspecified urinary incontinence: Secondary | ICD-10-CM

## 2018-01-21 NOTE — Progress Notes (Signed)
Patient present today complaining that her incontinence symptoms have worsened since her new box was replaced two weeks ago. Patient also states having trouble connecting with her box. Patient and husband were given detailed instruction on how to use new box and user guide was reviewed. Connection was made with device but this did take several attempts. Patient was not feeling stimulation she is currently on program 3 with 0.8amp. This was increased to 1.1 when sensation was felt. Patient was instructed not to increase again but to keep a voiding diary for the next two weeks to monitor improvement and call back with any problems or concerns. Patient and husband verbalized understanding. Voiding diary was given.

## 2018-01-29 ENCOUNTER — Encounter: Payer: Self-pay | Admitting: Emergency Medicine

## 2018-01-29 ENCOUNTER — Emergency Department
Admission: EM | Admit: 2018-01-29 | Discharge: 2018-01-29 | Disposition: A | Discharge status: Home / Self Care | Payer: Medicare HMO | Attending: Emergency Medicine | Admitting: Emergency Medicine

## 2018-01-29 ENCOUNTER — Other Ambulatory Visit: Payer: Self-pay

## 2018-01-29 ENCOUNTER — Ambulatory Visit: Payer: Self-pay | Admitting: Family Medicine

## 2018-01-29 DIAGNOSIS — R42 Dizziness and giddiness: Secondary | ICD-10-CM | POA: Insufficient documentation

## 2018-01-29 DIAGNOSIS — Z5321 Procedure and treatment not carried out due to patient leaving prior to being seen by health care provider: Secondary | ICD-10-CM | POA: Diagnosis not present

## 2018-01-29 LAB — URINALYSIS, COMPLETE (UACMP) WITH MICROSCOPIC
BILIRUBIN URINE: NEGATIVE
Glucose, UA: >=500 mg/dL — AB
Hgb urine dipstick: NEGATIVE
Ketones, ur: NEGATIVE mg/dL
Nitrite: NEGATIVE
PH: 5 (ref 5.0–8.0)
Protein, ur: NEGATIVE mg/dL
SPECIFIC GRAVITY, URINE: 1.009 (ref 1.005–1.030)

## 2018-01-29 LAB — BASIC METABOLIC PANEL
Anion gap: 9 (ref 5–15)
BUN: 15 mg/dL (ref 6–20)
CHLORIDE: 104 mmol/L (ref 101–111)
CO2: 27 mmol/L (ref 22–32)
CREATININE: 1.02 mg/dL — AB (ref 0.44–1.00)
Calcium: 9.7 mg/dL (ref 8.9–10.3)
GFR calc Af Amer: >60 mL/min (ref >60)
GFR calc non Af Amer: >60 mL/min (ref >60)
GLUCOSE: 123 mg/dL — AB (ref 65–99)
POTASSIUM: 4 mmol/L (ref 3.5–5.1)
SODIUM: 140 mmol/L (ref 135–145)

## 2018-01-29 LAB — CBC
HEMATOCRIT: 39.3 % (ref 35.0–47.0)
Hemoglobin: 13 g/dL (ref 12.0–16.0)
MCH: 27.8 pg (ref 26.0–34.0)
MCHC: 33.1 g/dL (ref 32.0–36.0)
MCV: 84 fL (ref 80.0–100.0)
PLATELETS: 303 10*3/uL (ref 150–440)
RBC: 4.67 MIL/uL (ref 3.80–5.20)
RDW: 16.2 % — AB (ref 11.5–14.5)
WBC: 6.5 10*3/uL (ref 3.6–11.0)

## 2018-01-29 LAB — GLUCOSE, CAPILLARY: GLUCOSE-CAPILLARY: 116 mg/dL — AB (ref 65–99)

## 2018-01-29 NOTE — ED Notes (Signed)
Pt comes into the ED via EMS from home with c/o HA x3 days. Pt is a/ox4.

## 2018-01-29 NOTE — Telephone Encounter (Signed)
Patient's husband is calling to report that patient has been having frequent headaches, dizzy spells and she has been so off balance that she is walking into walls. Patient states she has to have her husband help her get to her seat at times because she will fall- it is so bad. Due to the severity of the symptoms and the frequency- patient advised to go to ED- husband agrees. Reason for Disposition . Patient sounds very sick or weak to the triager  Answer Assessment - Initial Assessment Questions 1. SYMPTOM: "What is the main symptom you are concerned about?" (e.g., weakness, numbness)     Headache- 1 week- comes/goes, dizziness- all the times 2. ONSET: "When did this start?" (minutes, hours, days; while sleeping)     1 week ago 3. LAST NORMAL: "When was the last time you were normal (no symptoms)?"     Over 1 week ago 4. PATTERN "Does this come and go, or has it been constant since it started?"  "Is it present now?"     consistant 5. CARDIAC SYMPTOMS: "Have you had any of the following symptoms: chest pain, difficulty breathing, palpitations?"     no 6. NEUROLOGIC SYMPTOMS: "Have you had any of the following symptoms: headache, dizziness, vision loss, double vision, changes in speech, unsteady on your feet?"     Headache, dizziness, unsteady on feet, changes in speech- patient gets to where she can't talk 7. OTHER SYMPTOMS: "Do you have any other symptoms?"     Patient staggers 8. PREGNANCY: "Is there any chance you are pregnant?" "When was your last menstrual period?"     n/a  Protocols used: NEUROLOGIC DEFICIT-A-AH

## 2018-01-29 NOTE — Telephone Encounter (Signed)
Patient was instructed to go tot he ER

## 2018-01-29 NOTE — ED Triage Notes (Signed)
Pt arrived via EMS from home with reports of dizziness, headache, balance issues and forgetfulness.  Pt states the sxs started 3 days ago.  Pt denies hx of CVA.    Pt reports headache 8/10 denies sensitivity to light.    Pt is alert and oriented, answering questions appropriately, no slurred speech.

## 2018-01-31 ENCOUNTER — Telehealth: Payer: Self-pay | Admitting: Emergency Medicine

## 2018-01-31 NOTE — Telephone Encounter (Signed)
Called patient due to lwot to inquire about condition and follow up plans. Says she continues to have the headache and spells of dizziness-especially when she walks.  I told her to call her pcp.  Says she has already called her neurologist and they dont have appt for several weeks.  I told her to call her pcp, and if they cannot see her they may tell her to return here, but that she needs an exam either way.  She agrees.

## 2018-02-01 ENCOUNTER — Telehealth: Payer: Self-pay | Admitting: Family Medicine

## 2018-02-01 NOTE — Telephone Encounter (Signed)
Pt is checking status on referral to ENT. States that they never heard from the ENT and the referral was placed back in April. Please return call [phone]

## 2018-02-04 NOTE — Telephone Encounter (Signed)
LVM that patient's ENT referral has been sent [facility] in [facility] due to [facility] not being able to reschedule patient due multiple cancellations by patient.

## 2018-02-07 ENCOUNTER — Emergency Department
Admission: EM | Admit: 2018-02-07 | Discharge: 2018-02-07 | Disposition: A | Discharge status: Home / Self Care | Payer: Medicare HMO | Attending: Emergency Medicine | Admitting: Emergency Medicine

## 2018-02-07 ENCOUNTER — Emergency Department: Payer: Medicare HMO

## 2018-02-07 ENCOUNTER — Ambulatory Visit: Payer: Self-pay | Admitting: *Deleted

## 2018-02-07 ENCOUNTER — Encounter: Payer: Self-pay | Admitting: Emergency Medicine

## 2018-02-07 DIAGNOSIS — Z79899 Other long term (current) drug therapy: Secondary | ICD-10-CM | POA: Insufficient documentation

## 2018-02-07 DIAGNOSIS — E1140 Type 2 diabetes mellitus with diabetic neuropathy, unspecified: Secondary | ICD-10-CM | POA: Insufficient documentation

## 2018-02-07 DIAGNOSIS — N183 Chronic kidney disease, stage 3 (moderate): Secondary | ICD-10-CM | POA: Diagnosis not present

## 2018-02-07 DIAGNOSIS — I509 Heart failure, unspecified: Secondary | ICD-10-CM | POA: Insufficient documentation

## 2018-02-07 DIAGNOSIS — E1122 Type 2 diabetes mellitus with diabetic chronic kidney disease: Secondary | ICD-10-CM | POA: Diagnosis not present

## 2018-02-07 DIAGNOSIS — I130 Hypertensive heart and chronic kidney disease with heart failure and stage 1 through stage 4 chronic kidney disease, or unspecified chronic kidney disease: Secondary | ICD-10-CM | POA: Diagnosis not present

## 2018-02-07 DIAGNOSIS — I2510 Atherosclerotic heart disease of native coronary artery without angina pectoris: Secondary | ICD-10-CM | POA: Insufficient documentation

## 2018-02-07 DIAGNOSIS — Z794 Long term (current) use of insulin: Secondary | ICD-10-CM | POA: Diagnosis not present

## 2018-02-07 DIAGNOSIS — N390 Urinary tract infection, site not specified: Secondary | ICD-10-CM | POA: Diagnosis not present

## 2018-02-07 DIAGNOSIS — E860 Dehydration: Secondary | ICD-10-CM | POA: Diagnosis not present

## 2018-02-07 DIAGNOSIS — J449 Chronic obstructive pulmonary disease, unspecified: Secondary | ICD-10-CM | POA: Diagnosis not present

## 2018-02-07 DIAGNOSIS — Z7982 Long term (current) use of aspirin: Secondary | ICD-10-CM | POA: Insufficient documentation

## 2018-02-07 DIAGNOSIS — E039 Hypothyroidism, unspecified: Secondary | ICD-10-CM | POA: Insufficient documentation

## 2018-02-07 DIAGNOSIS — R531 Weakness: Secondary | ICD-10-CM | POA: Diagnosis present

## 2018-02-07 LAB — BASIC METABOLIC PANEL
Anion gap: 8 (ref 5–15)
BUN: 24 mg/dL — AB (ref 6–20)
CHLORIDE: 106 mmol/L (ref 101–111)
CO2: 27 mmol/L (ref 22–32)
Calcium: 9.8 mg/dL (ref 8.9–10.3)
Creatinine, Ser: 1.05 mg/dL — ABNORMAL HIGH (ref 0.44–1.00)
GFR calc Af Amer: >60 mL/min (ref >60)
GFR calc non Af Amer: 59 mL/min — ABNORMAL LOW (ref >60)
GLUCOSE: 97 mg/dL (ref 65–99)
POTASSIUM: 3.7 mmol/L (ref 3.5–5.1)
Sodium: 141 mmol/L (ref 135–145)

## 2018-02-07 LAB — CBC
HEMATOCRIT: 38.3 % (ref 35.0–47.0)
Hemoglobin: 12.8 g/dL (ref 12.0–16.0)
MCH: 27.8 pg (ref 26.0–34.0)
MCHC: 33.3 g/dL (ref 32.0–36.0)
MCV: 83.6 fL (ref 80.0–100.0)
Platelets: 286 10*3/uL (ref 150–440)
RBC: 4.59 MIL/uL (ref 3.80–5.20)
RDW: 15.5 % — AB (ref 11.5–14.5)
WBC: 6.8 10*3/uL (ref 3.6–11.0)

## 2018-02-07 LAB — URINALYSIS, COMPLETE (UACMP) WITH MICROSCOPIC
Bilirubin Urine: NEGATIVE
Glucose, UA: >=500 mg/dL — AB
Hgb urine dipstick: NEGATIVE
KETONES UR: NEGATIVE mg/dL
Nitrite: NEGATIVE
PH: 5 (ref 5.0–8.0)
Protein, ur: NEGATIVE mg/dL
Specific Gravity, Urine: 1.029 (ref 1.005–1.030)

## 2018-02-07 LAB — TROPONIN I: Troponin I: <0.03 ng/mL (ref <0.03)

## 2018-02-07 MED ORDER — SODIUM CHLORIDE 0.9 % IV BOLUS
500.0000 mL | Freq: Once | INTRAVENOUS | Status: AC
  Administered 2018-02-07: 500 mL via INTRAVENOUS

## 2018-02-07 MED ORDER — KETOROLAC TROMETHAMINE 15 MG/ML IJ SOLN
15.0000 mg | Freq: Once | INTRAMUSCULAR | Status: AC
  Administered 2018-02-07: 15 mg via INTRAVENOUS
  Filled 2018-02-07 (×2): qty 1

## 2018-02-07 MED ORDER — CEPHALEXIN 250 MG PO CAPS: 500.0000 mg | ORAL_CAPSULE | Freq: Three times a day (TID) | ORAL | 0 refills | Status: AC

## 2018-02-07 MED ORDER — KETOROLAC TROMETHAMINE 30 MG/ML IJ SOLN
INTRAMUSCULAR | Status: AC
  Filled 2018-02-07: qty 1

## 2018-02-07 MED ORDER — CEPHALEXIN 500 MG PO CAPS
500.0000 mg | ORAL_CAPSULE | Freq: Once | ORAL | Status: AC
  Administered 2018-02-07: 500 mg via ORAL
  Filled 2018-02-07: qty 1

## 2018-02-07 NOTE — ED Triage Notes (Addendum)
Pt comes into the ED via [facility] from home c/o weakness and syncopal episode today.  Patient c/o dizziness and diplopia.  Patient states this started at 10:00 this morning.  Patient states the last time she felt completely normal was two weeks ago because this has been happening intermittently.  Patient in NAD at this time and is currently neurologically intact.  Patient has even and unlabored respirations at this time. Patient hit her head on the wall with the syncopal episode and is now c/o headache. Denies any blood thinner use.

## 2018-02-07 NOTE — Discharge Instructions (Signed)
Return to the emergency room for any new or worrisome symptoms occluding fever, weakness, frequent falls, vomiting, diarrhea, headaches, any numbness or weakness in one part of your body or any other concerns.  Follow closely with your neurologist and your primary care doctor as soon as possible.

## 2018-02-07 NOTE — Telephone Encounter (Signed)
Pt reports severe headache, intermittent, 10/10 when occurs. Went to ED 01/29/18 with similar symptoms but left AMA.  Pt reports episode this AM. Associated with headache: diplopia, dizziness "walking into walls", slurred speech, memory loss, "Knees give out." Speech clear during call. Denies any symptoms presently, last episode this AM,states  occurs several times daily x 1 week. Pt directed to ED, advised to stay for evaluation. States she will follow disposition; husband will drive.   Reason for Disposition . Loss of vision or double vision (Exception: same as prior migraines)  Answer Assessment - Initial Assessment Questions 1. LOCATION: "Where does it hurt?"     Headache 2. ONSET: "When did the headache start?" (Minutes, hours or days)      1 week ago, again this am 3. PATTERN: "Does the pain come and go, or has it been constant since it started?"    Intermittent 4. SEVERITY: "How bad is the pain?" and "What does it keep you from doing?"  (e.g., Scale 1-10; mild, moderate, or severe)   - MILD (1-3): doesn't interfere with normal activities    - MODERATE (4-7): interferes with normal activities or awakens from sleep    - SEVERE (8-10): excruciating pain, unable to do any normal activities        Severe 10/10 5. RECURRENT SYMPTOM: "Have you ever had headaches before?" If so, ask: "When was the last time?" and "What happened that time?"      1 week ago; went to ED, left AMA 6. CAUSE: "What do you think is causing the headache?"     unsure 7. MIGRAINE: "Have you been diagnosed with migraine headaches?" If so, ask: "Is this headache similar?"      no 8. HEAD INJURY: "Has there been any recent injury to the head?"       9. OTHER SYMPTOMS: "Do you have any other symptoms?" (fever, stiff neck, eye pain, sore throat, cold symptoms)     Slurred speech (clear during call) dizziness, diplopia, memory loss.  Protocols used: HEADACHE-A-AH

## 2018-02-07 NOTE — ED Provider Notes (Signed)
[facility] Emergency Department Provider Note  ____________________________________________   I have reviewed the triage vital signs and the nursing notes. Where available I have reviewed prior notes and, if possible and indicated, outside hospital notes.    HISTORY  Chief Complaint Loss of Consciousness    HPI MATISYN CABEZA is a 52 y.o. female who has a host of medical problems, states that she has been feeling unsteady on her feet for the last 3 months.  No acute problems although today she tripped and fell.  Hit her head did not pass out.  Has a slight headache.  She denies any chest pain patient has had multiple work-ups for this in the past it appears.  Dysuria or urinary frequency.  She states that she has had no significant headaches, denies any focal numbness or weakness, she just feels "weak all over" and has for many months.      Past Medical History:  Diagnosis Date  . Anxiety   . Arthritis   . Asthma   . Bipolar depression ([place])   . CHF (congestive heart failure) ([place])   . Chronic headaches    migraines have lessened  . Chronic kidney disease    ddstage 111  . COPD (chronic obstructive pulmonary disease) ([place])   . Coronary artery disease   . Depression   . Diabetic neuropathy ([place])   . Dyspnea    uses inhalers for this  . Dysrhythmia    rapid  . Fatty liver   . GERD (gastroesophageal reflux disease)   . Headache   . Heart attack ([place]) 2009  . Hyperlipidemia   . Hypertension   . Hypomagnesemia   . Hypothyroid   . Irregular heart rhythm   . Liver lesion    Favored to be benign on CT; MRI of abd w/contract in Aug 2016. Liver Biopsy-Negative, March 2016  . Morbid obesity ([place])   . OSA (obstructive sleep apnea)   . Peripheral vascular disease ([place])   . Pinched nerve    Back  . Pneumonia   . RLS (restless legs syndrome)   . Seasonal allergies   . Sleep apnea    uses oxygen at night 2L d/t cpap did not fit  . Type 2 diabetes,  uncontrolled, with neuropathy ([place]) 10/15/2015   Overview:  Microalbumin 7.7 12/2010: Hgb A1c 10.2% 12/2010 and 9.7% 03/2011, eye exam 10/2009 [facility] DM clinic   . Urinary incontinence   . Vitamin D deficiency     Patient Active Problem List   Diagnosis Date Noted  . Encounter for Medicare annual wellness exam 10/30/2017  . Patient is full code 10/30/2017  . Disorder of bursae of shoulder region 10/09/2017  . Coronary artery disease 10/09/2017  . Plantar fascial fibromatosis 10/09/2017  . Tendinitis of wrist 10/09/2017  . Schizoaffective disorder, bipolar type ([place]) 09/28/2017  . Chest pain 09/10/2017  . Lobar pneumonia ([place]) 06/05/2017  . Multifocal pneumonia 05/27/2017  . Pleuritic chest pain 05/27/2017  . Hypertriglyceridemia 12/21/2016  . Pansinusitis 11/15/2016  . 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 Breast cancer screening 09/28/2016  . Urine frequency 09/28/2016  . Preventative health care 09/28/2016  . Fatigue 05/01/2016  . Urinary tract infection 03/14/2016  . Adenomatous colon polyp 10/15/2015  . Airway hyperreactivity 10/15/2015  . Bipolar affective disorder ([place]) 10/15/2015  . CN (constipation) 10/15/2015  . CAFL (chronic airflow limitation) ([place]) 10/15/2015  . Drug-induced hepatic toxicity 10/15/2015  . Blood in feces 10/15/2015  .  HLD (hyperlipidemia) 10/15/2015  . Hypoxemia 10/15/2015  . NASH (nonalcoholic steatohepatitis) 10/15/2015  . Morbid obesity ([place]) 10/15/2015  . Restless leg syndrome 10/15/2015  . Type 2 diabetes, uncontrolled, with neuropathy ([place]) 10/15/2015  . Elevated alkaline phosphatase level 09/22/2015  . Urinary bladder incontinence 09/22/2015  . Low back pain with left-sided sciatica 08/18/2015  . Encounter for monitoring tricyclic antidepressant therapy 05/13/2015  . Bipolar disorder ([place]) 05/11/2015  . Liver disease 05/11/2015  . Apnea, sleep 05/05/2015  . Heart valve disease 05/05/2015  . Chronic kidney disease, stage  III (moderate) (HCC) 03/15/2015  . Migraine 03/12/2015  . COPD (chronic obstructive pulmonary disease) ([place]) 01/20/2015  . 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 GERD (gastroesophageal reflux disease) 01/20/2015  . Hypothyroid   . Vitamin D deficiency   . Urinary incontinence   . Liver lesion   . Hypomagnesemia   . Obstructive sleep apnea 10/20/2013  . Vaginitis 12/19/2011  . PERIPHERAL NEUROPATHY 03/04/2009  . HEMORRHOIDS, EXTERNAL 09/03/2008  . ALLERGIC RHINITIS 07/04/2007  . ASTHMA, PERSISTENT, MODERATE 07/04/2007  . INTERSTITIAL CYSTITIS 07/04/2007  . HYPERTENSION, BENIGN ESSENTIAL 06/19/2007    Past Surgical History:  Procedure Laterality Date  . ABDOMINAL HYSTERECTOMY    . ABDOMINAL HYSTERECTOMY     Partial  . bladder stim  2007  . BLADDER SURGERY     x 2. tacking of bladder and the other was the stimulator placement  . CHOLECYSTECTOMY    . INTERSTIM-GENERATOR CHANGE N/A 12/31/2017   Procedure: INTERSTIM-GENERATOR CHANGE;  Surgeon: Bjorn Loser, MD;  Location: [facility] ORS;  Service: Urology;  Laterality: N/A;  . LIVER BIOPSY  March 2016   Negative  . LUNG BIOPSY  2015   small spot on lung  . TUBAL LIGATION      Prior to Admission medications   Medication Sig Start Date End Date Taking? Authorizing Provider  albuterol (PROVENTIL HFA;VENTOLIN HFA) 108 (90 Base) MCG/ACT inhaler Inhale 2 puffs into the lungs every 4 (four) hours as needed for wheezing or shortness of breath.    [provider]  amLODipine (NORVASC) 2.5 MG tablet Take 2.5 mg by mouth daily.    [provider]  aspirin EC 81 MG tablet Take 81 mg by mouth daily. Patient has stopped taking for surgery    [provider]  atorvastatin (LIPITOR) 80 MG tablet Take 80 mg by mouth daily.    [provider]  budesonide-formoterol (SYMBICORT) 160-4.5 MCG/ACT inhaler Inhale 2 puffs into the lungs 2 (two) times daily.    [provider]  cholecalciferol (VITAMIN  D) 1000 units tablet Take 1,000 Units by mouth daily.    [provider]  DULoxetine (CYMBALTA) 60 MG capsule Take 120 mg by mouth daily.    [provider]  ezetimibe (ZETIA) 10 MG tablet Take 10 mg by mouth daily.  01/05/17   [provider]  fenofibrate (TRICOR) 145 MG tablet Take 145 mg by mouth daily.    [provider]  fenofibrate micronized (LOFIBRA) 134 MG capsule  12/19/17   [provider]  fluconazole (DIFLUCAN) 100 MG tablet Take 1 tablet (100 mg total) by mouth daily. For 3 days 01/14/18   Bjorn Loser, MD  fluticasone [facility]) 50 MCG/ACT nasal spray Place 1 spray into both nostrils daily as needed for allergies.     [provider]  gabapentin (NEURONTIN) 300 MG capsule Take 1 capsule (300 mg total) by mouth 2 (two) times daily. 09/21/17   Edrick Kins, DPM  HUMULIN R U-500 KWIKPEN 500 UNIT/ML kwikpen Inject 60 Units as directed 3 (three) times daily. 09/18/17   [provider]  hydrOXYzine (VISTARIL) 50 MG capsule Take 50 mg by mouth 2 (two) times daily as needed.  06/14/17   [provider]  insulin aspart (NOVOLOG) 100 UNIT/ML injection Inject 5-25 Units into the skin 3 (three) times daily before meals.     [provider]  JARDIANCE 25 MG TABS tablet Take 25 mg by mouth daily. 09/19/17   [provider]  lamoTRIgine (LAMICTAL) 200 MG tablet Take 200 mg daily by mouth. 06/14/17   [provider]  lamoTRIgine (LAMICTAL) 25 MG tablet Take 25 mg by mouth 2 (two) times daily.    [provider]  levothyroxine (SYNTHROID, LEVOTHROID) 75 MCG tablet Take 1 tablet (75 mcg total) by mouth daily. 10/24/17   Arnetha Courser, MD  linaclotide (LINZESS) 72 MCG capsule Take 1 capsule (72 mcg total) by mouth daily before breakfast. 10/30/17   Lada, Satira Anis, MD  metoprolol tartrate (LOPRESSOR) 25 MG tablet Take 1 tablet (25 mg total) by mouth 2 (two) times daily. 09/11/17   Minna Merritts,  MD  ondansetron (ZOFRAN) 4 MG tablet Take 1 tablet (4 mg total) by mouth every 8 (eight) hours as needed for nausea or vomiting. 12/17/17   Poulose, Bethel Born, NP  ONETOUCH VERIO test strip  10/15/17   [provider]  pantoprazole (PROTONIX) 40 MG tablet Take 1 tablet (40 mg total) by mouth daily. 10/30/17   Lada, Satira Anis, MD  Prasterone (INTRAROSA) 6.5 MG INST Place 1 suppository vaginally daily. 11/27/17   Zara Council A, PA-C  prochlorperazine (COMPAZINE) 5 MG tablet Take 1 tablet (5 mg total) by mouth every 6 (six) hours as needed for nausea or vomiting. 12/04/17   Eula Listen, MD  QUEtiapine (SEROQUEL) 300 MG tablet Take 300 mg by mouth 2 (two) times daily.    [provider]  traMADol (ULTRAM) 50 MG tablet Take 1 tablet (50 mg total) by mouth every 6 (six) hours as needed. 12/31/17 12/31/18  Bjorn Loser, MD  traZODone (DESYREL) 50 MG tablet Take 50 mg by mouth 2 (two) times daily. 08/24/17   [provider]  TRULICITY 1.5 ZO/1.0RU SOPN 1.5 mg once a week.  10/11/17   [provider]  ULTICARE SHORT PEN NEEDLES 31G X 8 MM Advance  10/15/17   [provider]    Allergies Mirtazapine; Morphine and related; Sulfamethoxazole-trimethoprim; Amoxicillin er; Codeine; Hydrocodone-acetaminophen; and Prasterone  Family History  Problem Relation Age of Onset  . Diabetes Mother   . Heart disease Mother   . Hyperlipidemia Mother   . Hypertension Mother   . Kidney disease Mother   . Mental illness Mother   . Hypothyroidism Mother   . Stroke Mother   . Osteoporosis Mother   . Glaucoma Mother   . Congestive Heart Failure Mother   . Hypertension Father   . Asthma Daughter   . Cancer Daughter        throat  . Bipolar disorder Daughter   . Heart disease Maternal Grandfather   . Rheum arthritis Maternal Grandfather   . Cancer Maternal Grandfather        liver  . Kidney disease Maternal Grandfather   . Cancer Paternal Grandmother         lung  . Kidney disease Brother   . Breast cancer Maternal Grandmother 60  . Bipolar disorder Daughter   . Hypertension  Daughter   . Restless legs syndrome Daughter     Social History Social History   Tobacco Use  . Smoking status: Never Smoker  . Smokeless tobacco: Never Used  Substance Use Topics  . Alcohol use: No  . Drug use: No    Review of Systems Constitutional: No fever/chills Eyes: No visual changes. ENT: No sore throat. No stiff neck no neck pain Cardiovascular: Denies chest pain. Respiratory: Denies shortness of breath. Gastrointestinal:   no vomiting.  No diarrhea.  No constipation. Genitourinary: Negative for dysuria. Musculoskeletal: Negative lower extremity swelling Skin: Negative for rash. Neurological: Negative for severe headaches, focal weakness or numbness.   ____________________________________________   PHYSICAL EXAM:  VITAL SIGNS: ED Triage Vitals  Enc Vitals Group     BP 02/07/18 1347 106/60     Pulse Rate 02/07/18 1347 74     Resp 02/07/18 1347 18     Temp 02/07/18 1347 98.9 F (37.2 C)     Temp Source 02/07/18 1347 Oral     SpO2 02/07/18 1347 96 %     Weight 02/07/18 1347 186 lb (84.4 kg)     Height 02/07/18 1347 4' 11"  (1.499 m)     Head Circumference --      Peak Flow --      Pain Score 02/07/18 1350 8     Pain Loc --      Pain Edu? --      Excl. in [place]? --     Constitutional: Alert and oriented. Well appearing and in no acute distress. Eyes: Conjunctivae are normal Head: Atraumatic HEENT: No congestion/rhinnorhea. Mucous membranes are moist.  Oropharynx non-erythematous Neck:   Nontender with no meningismus, no masses, no stridor Cardiovascular: Normal rate, regular rhythm. Grossly normal heart sounds.  Good peripheral circulation. Respiratory: Normal respiratory effort.  No retractions. Lungs CTAB. Abdominal: Soft and nontender. No distention. No guarding no rebound Back:  There is no focal tenderness or step off.  there is  no midline tenderness there are no lesions noted. there is no CVA tenderness Musculoskeletal: No lower extremity tenderness, no upper extremity tenderness. No joint effusions, no DVT signs strong distal pulses no edema Neurologic:  Normal speech and language. No gross focal neurologic deficits are appreciated.  Skin:  Skin is warm, dry and intact. No rash noted. Psychiatric: Mood and affect are normal. Speech and behavior are normal.  ____________________________________________   LABS (all labs ordered are listed, but only abnormal results are displayed)  Labs Reviewed  BASIC METABOLIC PANEL - Abnormal; Notable for the following components:      Result Value   BUN 24 (*)    Creatinine, Ser 1.05 (*)    GFR calc non Af Amer 59 (*)    All other components within normal limits  CBC - Abnormal; Notable for the following components:   RDW 15.5 (*)    All other components within normal limits  URINALYSIS, COMPLETE (UACMP) WITH MICROSCOPIC - Abnormal; Notable for the following components:   Color, Urine YELLOW (*)    APPearance CLEAR (*)    Glucose, UA >=500 (*)    Leukocytes, UA TRACE (*)    Bacteria, UA RARE (*)    All other components within normal limits  URINE CULTURE  TROPONIN I    Pertinent labs  results that were available during my care of the patient were reviewed by me and considered in my medical decision making (see chart for details). ____________________________________________  EKG  I personally interpreted  any EKGs ordered by me or triage Sinus rhythm rate 74 bpm no acute ST elevation or depression nonspecific ST changes no acute ischemia ____________________________________________  RADIOLOGY  Pertinent labs & imaging results that were available during my care of the patient were reviewed by me and considered in my medical decision making (see chart for details). If possible, patient and/or family made aware of any abnormal findings.  Ct Head Wo  Contrast  Result Date: 02/07/2018 CLINICAL DATA:  Syncope with dizziness and diplopia EXAM: CT HEAD WITHOUT CONTRAST TECHNIQUE: Contiguous axial images were obtained from the base of the skull through the vertex without intravenous contrast. COMPARISON:  Jan 16, 2017 FINDINGS: Brain: The ventricles are normal in size and configuration. There is no intracranial mass, hemorrhage, extra-axial fluid collection, or midline shift. Gray-white compartments appear normal. No evident acute infarct. Foci of basal ganglia calcification bilaterally is likely physiologic. Vascular: No hyperdense vessel. There is slight calcification in the left carotid siphon. Skull: Bony calvarium appears intact. Sinuses/Orbits: There is patchy opacity in the left sphenoid sinus. Other visualized paranasal sinuses are clear. Visualized orbits appear symmetric bilaterally. Other: Mastoid air cells on the left are clear. There is opacification in several mastoid air cells on the right. IMPRESSION: No intracranial mass or hemorrhage. Gray-white compartments appear normal. There is patchy opacity in the left sphenoid sinus. There is opacification in several mastoid air cells on the right. There is slight arterial vascular calcification. Electronically Signed   By: Lowella Grip III M.D.   On: 02/07/2018 14:17   ____________________________________________    PROCEDURES  Procedure(s) performed: None  Procedures  Critical Care performed: None  ____________________________________________   INITIAL IMPRESSION / ASSESSMENT AND PLAN / ED COURSE  Pertinent labs & imaging results that were available during my care of the patient were reviewed by me and considered in my medical decision making (see chart for details).  She is here with a non-syncopal fall this morning which she attributes to feeling diffusely weak.  She does not have any focal weakness.  She cannot get an MRI for this diffuse weakness because she has metal  stimulators in her body.  Patient in no acute distress and does not appear to be apparent from her baseline, she feels fine at this time we will give her little IV fluid here, and I will obtain chest x-ray as a precaution to rule out what I can while she is here.  Urine is consistent with possible mild UTI will send urine culture and treat for 3 days, that could certainly contribute to her generalized sense of weakness.  Does have a neurologist and she will follow closely with them.  I do not see any acute pathology requiring admission certainly no trauma requirements required admission and her C-spine and other traumatic evaluation is negative for fracture clinically    ____________________________________________   FINAL CLINICAL IMPRESSION(S) / ED DIAGNOSES  Final diagnoses:  None      This chart was dictated using voice recognition software.  Despite best efforts to proofread,  errors can occur which can change meaning.      Schuyler Amor, MD 02/07/18 [phone]

## 2018-02-09 LAB — URINE CULTURE

## 2018-03-11 ENCOUNTER — Encounter: Payer: Self-pay | Admitting: Urology

## 2018-03-11 ENCOUNTER — Ambulatory Visit: Payer: Medicare HMO | Admitting: Urology

## 2018-03-13 ENCOUNTER — Telehealth: Payer: Self-pay | Admitting: *Deleted

## 2018-03-13 MED ORDER — GABAPENTIN 300 MG PO CAPS: 300.0000 mg | ORAL_CAPSULE | Freq: Two times a day (BID) | ORAL | 5 refills | Status: DC

## 2018-03-13 NOTE — Telephone Encounter (Signed)
Refill request for Gabapentin 387m. Dr.Evans states refill +5.

## 2018-04-03 ENCOUNTER — Other Ambulatory Visit: Payer: Self-pay | Admitting: Family Medicine

## 2018-04-03 MED ORDER — METOPROLOL TARTRATE 25 MG PO TABS: ORAL_TABLET | ORAL | 6 refills | Status: DC

## 2018-04-15 ENCOUNTER — Encounter: Payer: Self-pay | Admitting: Urology

## 2018-04-15 ENCOUNTER — Ambulatory Visit (INDEPENDENT_AMBULATORY_CARE_PROVIDER_SITE_OTHER): Payer: Medicare HMO | Admitting: Urology

## 2018-04-15 VITALS — BP 104/62 | HR 88 | Ht 59.0 in | Wt 181.0 lb

## 2018-04-15 DIAGNOSIS — N3946 Mixed incontinence: Secondary | ICD-10-CM | POA: Diagnosis not present

## 2018-04-15 DIAGNOSIS — R32 Unspecified urinary incontinence: Secondary | ICD-10-CM

## 2018-04-15 LAB — MICROSCOPIC EXAMINATION

## 2018-04-15 LAB — URINALYSIS, COMPLETE
Bilirubin, UA: NEGATIVE
LEUKOCYTES UA: NEGATIVE
NITRITE UA: NEGATIVE
RBC, UA: NEGATIVE
Specific Gravity, UA: >1.03 — ABNORMAL HIGH (ref 1.005–1.030)
Urobilinogen, Ur: 1 mg/dL (ref 0.2–1.0)
pH, UA: 5.5 (ref 5.0–7.5)

## 2018-04-15 NOTE — Progress Notes (Signed)
04/15/2018 2:53 PM   [name] 1966-08-15 [id number]  Referring provider: Arnetha Courser, MD [address] [place], [place] [address]  Chief Complaint  Patient presents with  . Urinary Incontinence    8wk     HPI: When I saw the patient in February 2017 the interstim device x-rays looked in good position. It was interrogated and the impedance check was normal. She turned the device up she felt it vaginally. She had good battery life then. She recentlysaw [facility] [place] and had a KUB November 27, 2017. Impedance check was done and bladder was found to be dead. She has been having some vaginal discomfort apparently with intercourse and has had a previous bladder suspension.  The patient thinks her InterStim was working beautifully until several weeks ago. She reported being almost completely dry. Now she has urge incontinence and milder stress incontinence. She does not wear pads because she says they irritate her. She changes her close several times. She ranks the incontinence is milder. She is now getting up 4 times at night and set up twice. She can void every 15-20 minutes and cannot hold it for 2 hours. She feels discomfort or pressure over the IPG. She is allergic to amoxicillin  last December 31, 2017 the patient had the IPG change.  All 4-lead positions looked excellent with good Bellows and toe responses at very low amplitude.  We can see dramatically better.  Improved urge incontinence.  She still has mild stress incontinence and I educated her regarding this.  Incisions look normal.  Some basic teaching was provided by our nurses.    Today Patient had called in leaking and was trouble shot by Judson Roch on Jan 21, 2018.  Frequency stable By history I believe that this did help significantly but in the last 6 weeks she started to leak more again.  She can leak sitting not associated with awareness.  She has moderate bedwetting.  She has urge incontinence.  She can  soak a pad when she goes out in public.  At home she changes her close.  She does not have cystitis symptoms but urine sent for culture.  She does not change the settings whatsoever  Modifying factors: There are no other modifying factors  Associated signs and symptoms: There are no other associated signs and symptoms Aggravating and relieving factors: There are no other aggravating or relieving factors Severity: Moderate Duration: Persistent   PMH: Past Medical History:  Diagnosis Date  . Anxiety   . Arthritis   . Asthma   . Bipolar depression ([place])   . CHF (congestive heart failure) ([place])   . Chronic headaches    migraines have lessened  . Chronic kidney disease    ddstage 111  . COPD (chronic obstructive pulmonary disease) ([place])   . Coronary artery disease   . Depression   . Diabetic neuropathy ([place])   . Dyspnea    uses inhalers for this  . Dysrhythmia    rapid  . Fatty liver   . GERD (gastroesophageal reflux disease)   . Headache   . Heart attack ([place]) 2009  . Hyperlipidemia   . Hypertension   . Hypomagnesemia   . Hypothyroid   . Irregular heart rhythm   . Liver lesion    Favored to be benign on CT; MRI of abd w/contract in Aug 2016. Liver Biopsy-Negative, March 2016  . Morbid obesity ([place])   . OSA (obstructive sleep apnea)   . Peripheral vascular disease (  [place])   . Pinched nerve    Back  . Pneumonia   . RLS (restless legs syndrome)   . Seasonal allergies   . Sleep apnea    uses oxygen at night 2L d/t cpap did not fit  . Type 2 diabetes, uncontrolled, with neuropathy ([place]) 10/15/2015   Overview:  Microalbumin 7.7 12/2010: Hgb A1c 10.2% 12/2010 and 9.7% 03/2011, eye exam 10/2009 [facility] DM clinic   . Urinary incontinence   . Vitamin D deficiency     Surgical History: Past Surgical History:  Procedure Laterality Date  . ABDOMINAL HYSTERECTOMY    . ABDOMINAL HYSTERECTOMY     Partial  . bladder stim  2007  . BLADDER SURGERY     x 2. tacking of bladder and the  other was the stimulator placement  . CHOLECYSTECTOMY    . INTERSTIM-GENERATOR CHANGE N/A 12/31/2017   Procedure: INTERSTIM-GENERATOR CHANGE;  Surgeon: Bjorn Loser, MD;  Location: [facility] ORS;  Service: Urology;  Laterality: N/A;  . LIVER BIOPSY  March 2016   Negative  . LUNG BIOPSY  2015   small spot on lung  . TUBAL LIGATION      Home Medications:  Allergies as of 04/15/2018      Reactions   Mirtazapine Anaphylaxis   REACTION: closes throat   Morphine And Related    anaphylaxis   Sulfamethoxazole-trimethoprim Rash   REACTION: Whelps all over   Amoxicillin Er Nausea And Vomiting   Has patient had a PCN reaction causing immediate rash, facial/tongue/throat swelling, SOB or lightheadedness with hypotension: No Has patient had a PCN reaction causing severe rash involving mucus membranes or skin necrosis: No Has patient had a PCN reaction that required hospitalization: No Has patient had a PCN reaction occurring within the last 10 years: Yes If all of the above answers are "NO", then may proceed with Cephalosporin use.   Codeine Itching   REACTION: itch, rash   Hydrocodone-acetaminophen Rash   REACTION: rash   Prasterone Nausea And Vomiting      Medication List        Accurate as of 04/15/18  2:53 PM. Always use your most recent med list.          albuterol 108 (90 Base) MCG/ACT inhaler Commonly known as:  PROVENTIL HFA;VENTOLIN HFA Inhale 2 puffs into the lungs every 4 (four) hours as needed for wheezing or shortness of breath.   amLODipine 2.5 MG tablet Commonly known as:  NORVASC Take 2.5 mg by mouth daily.   atorvastatin 80 MG tablet Commonly known as:  LIPITOR Take 80 mg by mouth at bedtime.   budesonide-formoterol 160-4.5 MCG/ACT inhaler Commonly known as:  SYMBICORT Inhale 2 puffs into the lungs 2 (two) times daily.   cholecalciferol 1000 units tablet Commonly known as:  VITAMIN D Take 1,000 Units by mouth daily.   divalproex 250 MG 24 hr  tablet Commonly known as:  DEPAKOTE ER divalproex ER 250 mg tablet,extended release 24 hr   DULoxetine 60 MG capsule Commonly known as:  CYMBALTA Take 120 mg by mouth daily.   empagliflozin 25 MG Tabs tablet Commonly known as:  JARDIANCE Take 25 mg by mouth daily.   ezetimibe 10 MG tablet Commonly known as:  ZETIA Take 10 mg by mouth daily.   fenofibrate micronized 134 MG capsule Commonly known as:  LOFIBRA Take 134 mg by mouth at bedtime.   fluconazole 100 MG tablet Commonly known as:  DIFLUCAN Take 1 tablet (100 mg total) by mouth daily.  For 3 days   gabapentin 300 MG capsule Commonly known as:  NEURONTIN Take 1 capsule (300 mg total) by mouth 2 (two) times daily.   HUMULIN R U-500 KWIKPEN 500 UNIT/ML kwikpen Generic drug:  insulin regular human CONCENTRATED Inject 30 Units as directed 3 (three) times daily.   hydrOXYzine 50 MG capsule Commonly known as:  VISTARIL Take 50-100 mg by mouth daily as needed for anxiety.   insulin aspart 100 UNIT/ML injection Commonly known as:  novoLOG Inject 5-25 Units into the skin 3 (three) times daily before meals.   lamoTRIgine 200 MG tablet Commonly known as:  LAMICTAL Take 200 mg by mouth 2 (two) times daily.   levothyroxine 75 MCG tablet Commonly known as:  SYNTHROID, LEVOTHROID Take 1 tablet (75 mcg total) by mouth daily.   linaclotide 72 MCG capsule Commonly known as:  LINZESS Take 1 capsule (72 mcg total) by mouth daily before breakfast.   metoprolol tartrate 25 MG tablet Commonly known as:  LOPRESSOR Take one tablet 25 mg in the am and 1/2 tab in the pm   ondansetron 4 MG tablet Commonly known as:  ZOFRAN Take 1 tablet (4 mg total) by mouth every 8 (eight) hours as needed for nausea or vomiting.   pantoprazole 40 MG tablet Commonly known as:  PROTONIX Take 1 tablet (40 mg total) by mouth daily.   Prasterone 6.5 MG Inst Place 1 suppository vaginally daily.   prochlorperazine 5 MG tablet Commonly known as:   COMPAZINE Take 1 tablet (5 mg total) by mouth every 6 (six) hours as needed for nausea or vomiting.   QUEtiapine 400 MG tablet Commonly known as:  SEROQUEL Take 400 mg by mouth at bedtime.   traMADol 50 MG tablet Commonly known as:  ULTRAM Take 1 tablet (50 mg total) by mouth every 6 (six) hours as needed.   traZODone 100 MG tablet Commonly known as:  DESYREL Take 100 mg by mouth at bedtime.   TRULICITY 1.5 NK/5.3ZJ Sopn Generic drug:  Dulaglutide       Allergies:  Allergies  Allergen Reactions  . Mirtazapine Anaphylaxis    REACTION: closes throat  . Morphine And Related     anaphylaxis  . Sulfamethoxazole-Trimethoprim Rash    REACTION: Whelps all over  . Amoxicillin Er Nausea And Vomiting    Has patient had a PCN reaction causing immediate rash, facial/tongue/throat swelling, SOB or lightheadedness with hypotension: No Has patient had a PCN reaction causing severe rash involving mucus membranes or skin necrosis: No Has patient had a PCN reaction that required hospitalization: No Has patient had a PCN reaction occurring within the last 10 years: Yes If all of the above answers are "NO", then may proceed with Cephalosporin use.  . Codeine Itching    REACTION: itch, rash  . Hydrocodone-Acetaminophen Rash    REACTION: rash  . Prasterone Nausea And Vomiting    Family History: Family History  Problem Relation Age of Onset  . Diabetes Mother   . Heart disease Mother   . Hyperlipidemia Mother   . Hypertension Mother   . Kidney disease Mother   . Mental illness Mother   . Hypothyroidism Mother   . Stroke Mother   . Osteoporosis Mother   . Glaucoma Mother   . Congestive Heart Failure Mother   . Hypertension Father   . Asthma Daughter   . Cancer Daughter        throat  . Bipolar disorder Daughter   . Heart disease Maternal  Grandfather   . Rheum arthritis Maternal Grandfather   . Cancer Maternal Grandfather        liver  . Kidney disease Maternal Grandfather     . Cancer Paternal Grandmother        lung  . Kidney disease Brother   . Breast cancer Maternal Grandmother 60  . Bipolar disorder Daughter   . Hypertension Daughter   . Restless legs syndrome Daughter     Social History:  reports that she has never smoked. She has never used smokeless tobacco. She reports that she does not drink alcohol or use drugs.  ROS: UROLOGY Frequent Urination?: Yes Hard to postpone urination?: Yes Burning/pain with urination?: No Get up at night to urinate?: Yes Leakage of urine?: Yes Urine stream starts and stops?: Yes Trouble starting stream?: No Do you have to strain to urinate?: Yes Blood in urine?: No Urinary tract infection?: No Sexually transmitted disease?: No Injury to kidneys or bladder?: No Painful intercourse?: Yes Weak stream?: No Currently pregnant?: No Vaginal bleeding?: No Last menstrual period?: n  Gastrointestinal Nausea?: Yes Vomiting?: No Indigestion/heartburn?: No Diarrhea?: No Constipation?: No  Constitutional Fever: No Night sweats?: Yes Weight loss?: Yes Fatigue?: Yes  Skin Skin rash/lesions?: No Itching?: No  Eyes Blurred vision?: Yes Double vision?: Yes  Ears/Nose/Throat Sore throat?: No Sinus problems?: Yes  Hematologic/Lymphatic Swollen glands?: No Easy bruising?: No  Cardiovascular Leg swelling?: No Chest pain?: No  Respiratory Cough?: No Shortness of breath?: Yes  Endocrine Excessive thirst?: Yes  Musculoskeletal Back pain?: Yes Joint pain?: No  Neurological Headaches?: Yes Dizziness?: Yes  Psychologic Depression?: Yes Anxiety?: Yes  Physical Exam: BP 104/62   Pulse 88   Ht 4' 11"  (1.499 m)   Wt 181 lb (82.1 kg)   BMI 36.56 kg/m   Constitutional:  Alert and oriented, No acute distress.   Laboratory Data: Lab Results  Component Value Date   WBC 6.8 02/07/2018   HGB 12.8 02/07/2018   HCT 38.3 02/07/2018   MCV 83.6 02/07/2018   PLT 286 02/07/2018    Lab Results   Component Value Date   CREATININE 1.05 (H) 02/07/2018    No results found for: PSA  No results found for: TESTOSTERONE  Lab Results  Component Value Date   HGBA1C 8.9 11/10/2017    Urinalysis    Component Value Date/Time   COLORURINE YELLOW (A) 02/07/2018 1358   APPEARANCEUR CLEAR (A) 02/07/2018 1358   APPEARANCEUR Clear 01/14/2018 1140   LABSPEC 1.029 02/07/2018 1358   LABSPEC 1.026 10/09/2014 1808   PHURINE 5.0 02/07/2018 1358   GLUCOSEU >=500 (A) 02/07/2018 1358   GLUCOSEU >=500 10/09/2014 1808   HGBUR NEGATIVE 02/07/2018 1358   HGBUR moderate 01/04/2009 0913   BILIRUBINUR NEGATIVE 02/07/2018 1358   BILIRUBINUR Negative 01/14/2018 1140   BILIRUBINUR Negative 10/09/2014 1808   KETONESUR NEGATIVE 02/07/2018 1358   PROTEINUR NEGATIVE 02/07/2018 1358   UROBILINOGEN negative (A) 09/10/2017 1147   UROBILINOGEN 0.2 01/04/2009 0913   NITRITE NEGATIVE 02/07/2018 1358   LEUKOCYTESUR TRACE (A) 02/07/2018 1358   LEUKOCYTESUR Negative 01/14/2018 1140   LEUKOCYTESUR Negative 10/09/2014 1808    Pertinent Imaging:   Assessment & Plan: She did not feel it rectally and vaginally at 1.1 but did at 1.5 and was left at that amplitude.  Again there was a lot of time needed to connect to the device for unknown reasons.  This happened last time as well.  Judson Roch will see her in 2 weeks and make further adjustments.  We do not think the patient is able to do this on her own.  She was told to improve her diet since she had glucose in the urine.  Urine cultures positive  1. Urinary incontinence, unspecified type  - Urinalysis, Complete   No follow-ups on file.  Reece Packer, MD  [facility] [address], [place], [place] [address] [phone]

## 2018-04-15 NOTE — Progress Notes (Signed)
Patient present today for interstim adjustment due to increased urinary symptoms.  Patient is currently on program 3 at 1.1 without sensation. Program was increased to 1.5 patient felt stimulation in vaginal/rectum. She was told to keep a urinary diary for 2wk and follow up. Urine today did not look positive for infection but was sent or culture. Urine today did show 3+ glucose and patient's diet was discussed in management of symptoms as well.

## 2018-04-16 ENCOUNTER — Encounter: Payer: Self-pay | Admitting: Family Medicine

## 2018-04-16 ENCOUNTER — Telehealth: Payer: Self-pay | Admitting: Family Medicine

## 2018-04-16 ENCOUNTER — Ambulatory Visit (INDEPENDENT_AMBULATORY_CARE_PROVIDER_SITE_OTHER): Payer: Medicare HMO | Admitting: Family Medicine

## 2018-04-16 VITALS — BP 136/84 | HR 87 | Temp 99.1°F | Ht 59.0 in | Wt 181.1 lb

## 2018-04-16 DIAGNOSIS — R251 Tremor, unspecified: Secondary | ICD-10-CM

## 2018-04-16 DIAGNOSIS — G4730 Sleep apnea, unspecified: Secondary | ICD-10-CM | POA: Diagnosis not present

## 2018-04-16 DIAGNOSIS — IMO0002 Reserved for concepts with insufficient information to code with codable children: Secondary | ICD-10-CM

## 2018-04-16 DIAGNOSIS — K7581 Nonalcoholic steatohepatitis (NASH): Secondary | ICD-10-CM

## 2018-04-16 DIAGNOSIS — R4781 Slurred speech: Secondary | ICD-10-CM

## 2018-04-16 DIAGNOSIS — K769 Liver disease, unspecified: Secondary | ICD-10-CM

## 2018-04-16 DIAGNOSIS — R110 Nausea: Secondary | ICD-10-CM

## 2018-04-16 DIAGNOSIS — G629 Polyneuropathy, unspecified: Secondary | ICD-10-CM

## 2018-04-16 DIAGNOSIS — E1165 Type 2 diabetes mellitus with hyperglycemia: Secondary | ICD-10-CM

## 2018-04-16 DIAGNOSIS — E1140 Type 2 diabetes mellitus with diabetic neuropathy, unspecified: Secondary | ICD-10-CM

## 2018-04-16 DIAGNOSIS — R748 Abnormal levels of other serum enzymes: Secondary | ICD-10-CM | POA: Diagnosis not present

## 2018-04-16 DIAGNOSIS — R519 Headache, unspecified: Secondary | ICD-10-CM

## 2018-04-16 DIAGNOSIS — F250 Schizoaffective disorder, bipolar type: Secondary | ICD-10-CM

## 2018-04-16 DIAGNOSIS — R51 Headache: Secondary | ICD-10-CM

## 2018-04-16 MED ORDER — ONDANSETRON HCL 4 MG PO TABS: 4.0000 mg | ORAL_TABLET | Freq: Three times a day (TID) | ORAL | 0 refills | Status: DC | PRN

## 2018-04-16 MED ORDER — LISINOPRIL 2.5 MG PO TABS: 2.5000 mg | ORAL_TABLET | Freq: Every day | ORAL | 1 refills | Status: DC

## 2018-04-16 MED ORDER — FLUTICASONE PROPIONATE 50 MCG/ACT NA SUSP: 2.0000 | Freq: Every day | NASAL | 6 refills | Status: DC

## 2018-04-16 NOTE — Progress Notes (Signed)
BP 136/84   Pulse 87   Temp 99.1 F (37.3 C)   Ht 4' 11"  (1.499 m)   Wt 181 lb 1.6 oz (82.1 kg)   SpO2 96%   BMI 36.58 kg/m    Subjective:    Patient ID: [name], female    DOB: 1966-01-12, 52 y.o.   MRN: [id number]  HPI: [name] is a 52 y.o. female  Chief Complaint  Patient presents with  . Dizziness    with headache, blurred vision, loss of balance, slurred speech. Pt has already had a CT at hospital which they state is normal. Onset 1 month.    HPI  Patient is here basically to get two referrals; one to GI and one to neuro; these are to established providers  She is having spells or episodes She is bumping off of the walls Speech gets slurred; she'll start jerking She gets really tired afterwards; cannot stand up and goes to lay down, different amounts of time She had a brain scan in April She has a resting tremor, her hands will just shake without doing anything  She is going to see Dr. Allen Norris; hurts all the time, worse after eating, any food (not just with fatty foods; she has fatty liver, a liver lesion listed in her chart, but the CT renal stone study done 07/14/17 did not show anything; she is requesting zofran refill for nausea  Working on weight loss; cutting back; trying to eat healthier  Sugars are running from 100-200; felt terrible when sugar dropped to 55 once; doing three shots, two are insulin and one is once weekly  "My mood is fine"  Depression screen [facility] 2/9 04/16/2018 11/28/2017 10/30/2017 10/09/2017 09/21/2017  Decreased Interest 0 0 0 0 0  Down, Depressed, Hopeless 0 1 0 0 0  PHQ - 2 Score 0 1 0 0 0  Some recent data might be hidden    Relevant past medical, surgical, family and social history reviewed Past Medical History:  Diagnosis Date  . Anxiety   . Arthritis   . Asthma   . Bipolar depression ([place])   . CHF (congestive heart failure) ([place])   . Chronic headaches    migraines have lessened  . Chronic kidney disease    ddstage  111  . COPD (chronic obstructive pulmonary disease) ([place])   . Coronary artery disease   . Depression   . Diabetic neuropathy ([place])   . Dyspnea    uses inhalers for this  . Dysrhythmia    rapid  . Fatty liver   . GERD (gastroesophageal reflux disease)   . Headache   . Heart attack ([place]) 2009  . Hyperlipidemia   . Hypertension   . Hypomagnesemia   . Hypothyroid   . Irregular heart rhythm   . Liver lesion    Favored to be benign on CT; MRI of abd w/contract in Aug 2016. Liver Biopsy-Negative, March 2016  . Morbid obesity ([place])   . OSA (obstructive sleep apnea)   . Peripheral vascular disease ([place])   . Pinched nerve    Back  . Pneumonia   . RLS (restless legs syndrome)   . Seasonal allergies   . Sleep apnea    uses oxygen at night 2L d/t cpap did not fit  . Type 2 diabetes, uncontrolled, with neuropathy ([place]) 10/15/2015   Overview:  Microalbumin 7.7 12/2010: Hgb A1c 10.2% 12/2010 and 9.7% 03/2011, eye exam 10/2009 [facility] DM clinic   . Urinary  incontinence   . Vitamin D deficiency    Past Surgical History:  Procedure Laterality Date  . ABDOMINAL HYSTERECTOMY    . ABDOMINAL HYSTERECTOMY     Partial  . bladder stim  2007  . BLADDER SURGERY     x 2. tacking of bladder and the other was the stimulator placement  . CHOLECYSTECTOMY    . INTERSTIM-GENERATOR CHANGE N/A 12/31/2017   Procedure: INTERSTIM-GENERATOR CHANGE;  Surgeon: Bjorn Loser, MD;  Location: [facility] ORS;  Service: Urology;  Laterality: N/A;  . LIVER BIOPSY  March 2016   Negative  . LUNG BIOPSY  2015   small spot on lung  . TUBAL LIGATION     Family History  Problem Relation Age of Onset  . Diabetes Mother   . Heart disease Mother   . Hyperlipidemia Mother   . Hypertension Mother   . Kidney disease Mother   . Mental illness Mother   . Hypothyroidism Mother   . Stroke Mother   . Osteoporosis Mother   . Glaucoma Mother   . Congestive Heart Failure Mother   . Hypertension Father   . Asthma Daughter   .  Cancer Daughter        throat  . Bipolar disorder Daughter   . Heart disease Maternal Grandfather   . Rheum arthritis Maternal Grandfather   . Cancer Maternal Grandfather        liver  . Kidney disease Maternal Grandfather   . Cancer Paternal Grandmother        lung  . Kidney disease Brother   . Breast cancer Maternal Grandmother 60  . Bipolar disorder Daughter   . Hypertension Daughter   . Restless legs syndrome Daughter    Social History   Tobacco Use  . Smoking status: Never Smoker  . Smokeless tobacco: Never Used  Substance Use Topics  . Alcohol use: No  . Drug use: No    Interim medical history since last visit reviewed. Allergies and medications reviewed  Review of Systems Per HPI unless specifically indicated above     Objective:    BP 136/84   Pulse 87   Temp 99.1 F (37.3 C)   Ht 4' 11"  (1.499 m)   Wt 181 lb 1.6 oz (82.1 kg)   SpO2 96%   BMI 36.58 kg/m   Wt Readings from Last 3 Encounters:  04/16/18 181 lb 1.6 oz (82.1 kg)  04/15/18 181 lb (82.1 kg)  02/07/18 186 lb (84.4 kg)    Physical Exam  Constitutional: She appears well-developed and well-nourished. No distress.  HENT:  Head: Normocephalic and atraumatic.  Right Ear: Ear canal normal.  Left Ear: Ear canal normal. A middle ear effusion is present.  Eyes: EOM are normal. No scleral icterus.  Neck: No thyromegaly present.  Cardiovascular: Normal rate, regular rhythm and normal heart sounds.  No murmur heard. Pulmonary/Chest: Effort normal and breath sounds normal. No respiratory distress. She has no wheezes.  Abdominal: Soft. Bowel sounds are normal. She exhibits no distension.  Musculoskeletal: She exhibits no edema.  Neurological: She is alert. She displays tremor. She displays no atrophy. She exhibits normal muscle tone. She displays no seizure activity. Gait normal.  Skin: Skin is warm and dry. She is not diaphoretic. No pallor.  Psychiatric: She has a normal mood and affect. Her  behavior is normal. Judgment and thought content normal.   Diabetic Foot Form - Detailed   Diabetic Foot Exam - detailed Diabetic Foot exam was performed with the  following findings:  Yes 04/16/2018  3:36 PM  Visual Foot Exam completed.:  Yes  Pulse Foot Exam completed.:  Yes  Right Dorsalis Pedis:  Present Left Dorsalis Pedis:  Present  Sensory Foot Exam Completed.:  Yes Semmes-Weinstein Monofilament Test R Site 1-Great Toe:  Pos L Site 1-Great Toe:  Pos          Assessment & Plan:   Problem List Items Addressed This Visit      Respiratory   Apnea, sleep   Relevant Orders   Ambulatory referral to Neurology     Digestive   Liver disease - Primary   Relevant Orders   Ambulatory referral to Gastroenterology   NASH (nonalcoholic steatohepatitis)   Relevant Orders   Ambulatory referral to Gastroenterology     Endocrine   Type 2 diabetes, uncontrolled, with neuropathy ([place])    Managed by endo; foot exam today by MD; so glad to hear that her sugars are under better control; will start ACE-I at very low dose for renal protection; I do not see why it was stopped previously      Relevant Medications   lisinopril (PRINIVIL,ZESTRIL) 2.5 MG tablet     Other   Liver lesion   Relevant Orders   Ambulatory referral to Gastroenterology   Elevated alkaline phosphatase level   Relevant Orders   Ambulatory referral to Gastroenterology   Schizoaffective disorder, bipolar type ([place])    Stable, chronic; under the care of psychiatrist      Morbid obesity ([place])    BMI > 35 with comorbid high cholesterol, diabetes, fatty liver       Other Visit Diagnoses    Neuropathy       Relevant Orders   Ambulatory referral to Neurology   Tremor       Relevant Orders   Ambulatory referral to Neurology   Nonintractable episodic headache, unspecified headache type       Relevant Orders   Ambulatory referral to Neurology   Slurred speech       Relevant Orders   Ambulatory referral to  Neurology   Nausea       Relevant Medications   ondansetron (ZOFRAN) 4 MG tablet       Follow up plan: Return in about 3 months (around 07/17/2018) for follow-up visit with [name].  An after-visit summary was printed and given to the patient at [facility].  Please see the patient instructions which may contain other information and recommendations beyond what is mentioned above in the assessment and plan.  Meds ordered this encounter  Medications  . fluticasone (FLONASE) 50 MCG/ACT nasal spray    Sig: Place 2 sprays into both nostrils daily.    Dispense:  16 g    Refill:  6  . ondansetron (ZOFRAN) 4 MG tablet    Sig: Take 1 tablet (4 mg total) by mouth every 8 (eight) hours as needed for nausea or vomiting.    Dispense:  20 tablet    Refill:  0    NOT the sublingual dissolvable kind  . lisinopril (PRINIVIL,ZESTRIL) 2.5 MG tablet    Sig: Take 1 tablet (2.5 mg total) by mouth daily.    Dispense:  30 tablet    Refill:  1    Orders Placed This Encounter  Procedures  . Ambulatory referral to Neurology  . Ambulatory referral to Gastroenterology

## 2018-04-16 NOTE — Patient Instructions (Signed)
Flu shot any time in the next couple of months I've put in the referrals for you  Check out the information at familydoctor.org entitled "Nutrition for Weight Loss: What You Need to Know about Fad Diets" Try to lose between 1-2 pounds per week by taking in fewer calories and burning off more calories You can succeed by limiting portions, limiting foods dense in calories and fat, becoming more active, and drinking 8 glasses of water a day (64 ounces) Don't skip meals, especially breakfast, as skipping meals may alter your metabolism Do not use over-the-counter weight loss pills or gimmicks that claim rapid weight loss A healthy BMI (or body mass index) is between 18.5 and 24.9 You can calculate your ideal BMI at the [place] website [employer]fr

## 2018-04-16 NOTE — Assessment & Plan Note (Addendum)
Managed by endo; foot exam today by MD; so glad to hear that her sugars are under better control; will start ACE-I at very low dose for renal protection; I do not see why it was stopped previously

## 2018-04-16 NOTE — Telephone Encounter (Signed)
Please let patient know that her BP was just above 130 today and I'll recommend that she start a very low dose medicine to protect her kidneys; it's called lisinopril Ask her if she remembers ever being on this medicine, and if so, did she have any problems; if so, back to me After she starts the lisinopril, ask her to come back in 10-14 days for recheck BP and BMP (please ORDER)

## 2018-04-16 NOTE — Assessment & Plan Note (Addendum)
BMI > 35 with comorbid high cholesterol, diabetes, fatty liver; praise given for her weight loss

## 2018-04-16 NOTE — Telephone Encounter (Signed)
Pt.notified

## 2018-04-16 NOTE — Assessment & Plan Note (Signed)
Stable, chronic; under the care of psychiatrist

## 2018-04-17 LAB — CULTURE, URINE COMPREHENSIVE

## 2018-05-01 ENCOUNTER — Ambulatory Visit: Payer: Self-pay

## 2018-05-07 ENCOUNTER — Encounter: Payer: Self-pay | Admitting: Urology

## 2018-05-07 ENCOUNTER — Ambulatory Visit: Payer: Self-pay

## 2018-05-29 ENCOUNTER — Ambulatory Visit: Payer: Medicare HMO | Admitting: Gastroenterology

## 2018-06-01 IMAGING — CT CT HEAD W/O CM
3 series · 15 of 45 positions shown, 18 images · non-contrast
Comparison: 03/16/2015

CLINICAL DATA: Headache with dizziness, pt reports being treated
for a double ear infection x 4 days

EXAM:
CT HEAD WITHOUT CONTRAST
TECHNIQUE: Contiguous axial images were obtained from the base of the skull
through the vertex without intravenous contrast.

[Series 2: head wo · axial · 0.47mm/px · z∈[-143,-28]mm · 9 of 28 slices shown, 12 images]
[im 3/28  brain]
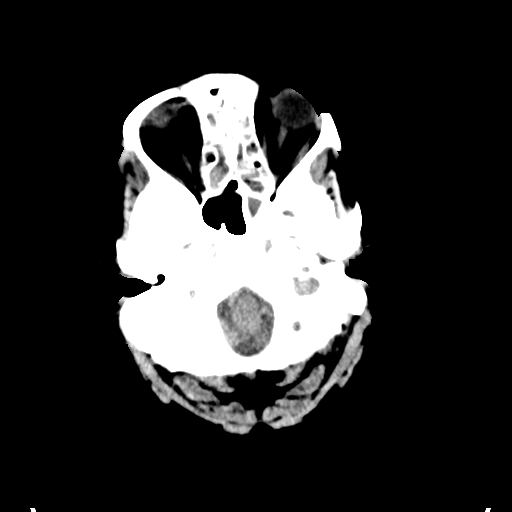
[im 3/28  bone]
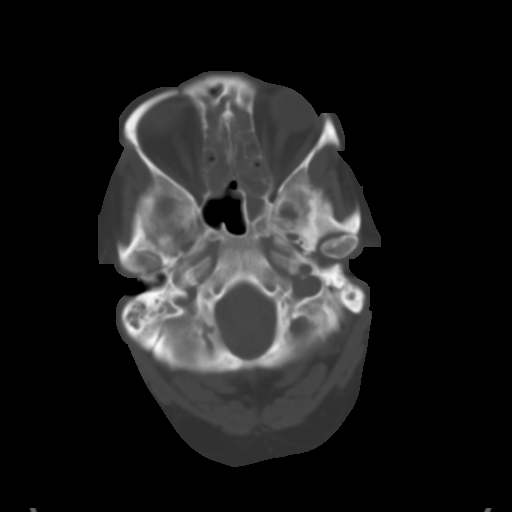
[im 6/28  brain]
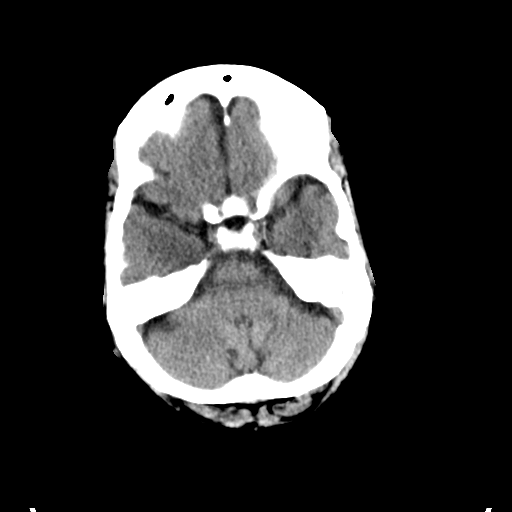
[im 9/28  brain]
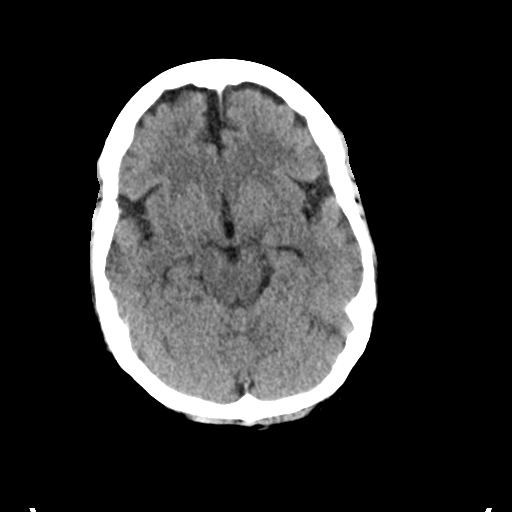
[im 12/28  brain]
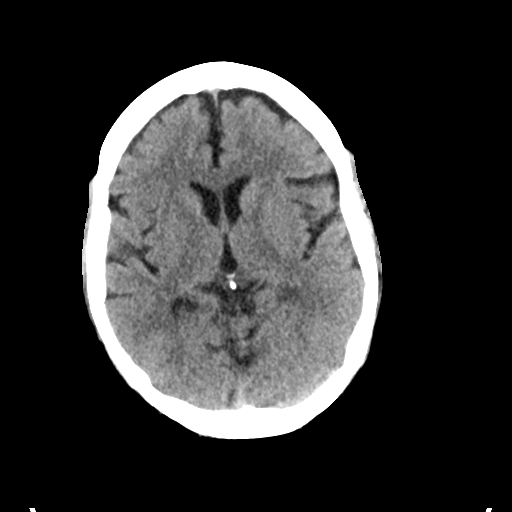
[im 15/28  brain]
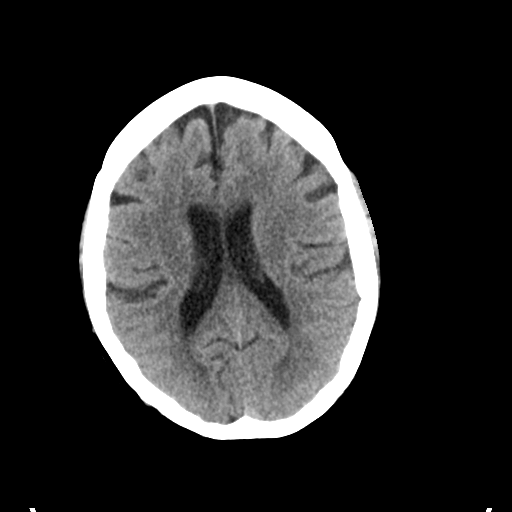
[im 15/28  bone]
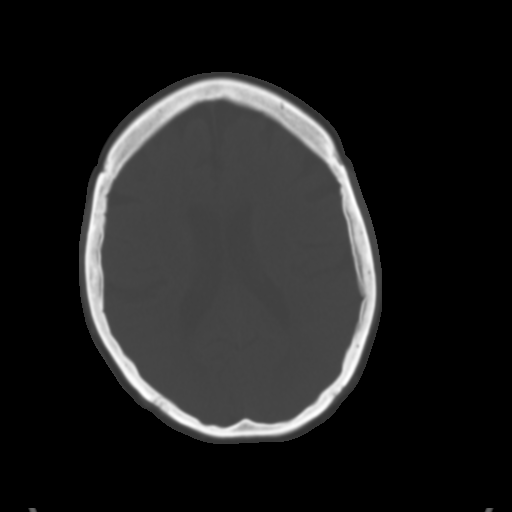
[im 17/28  brain]
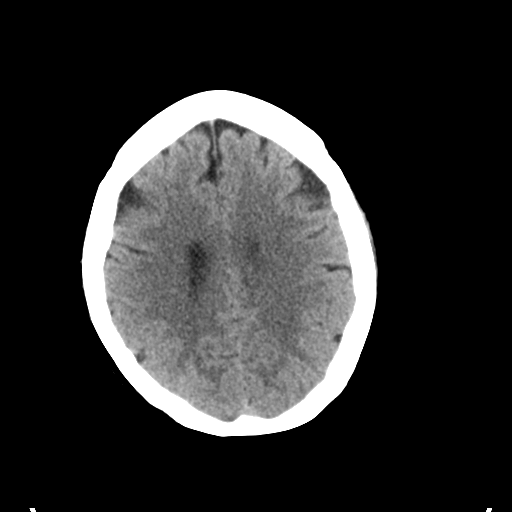
[im 20/28  brain]
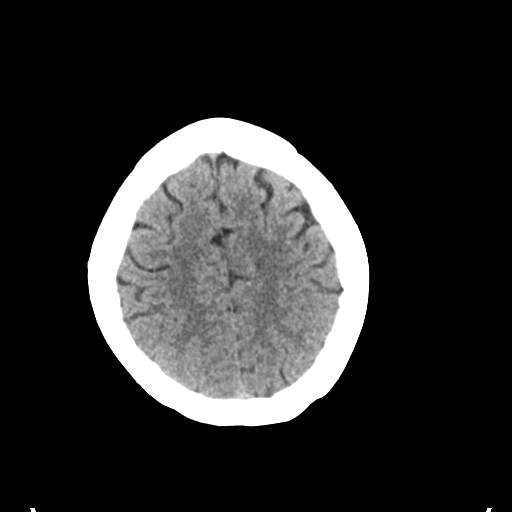
[im 23/28  brain]
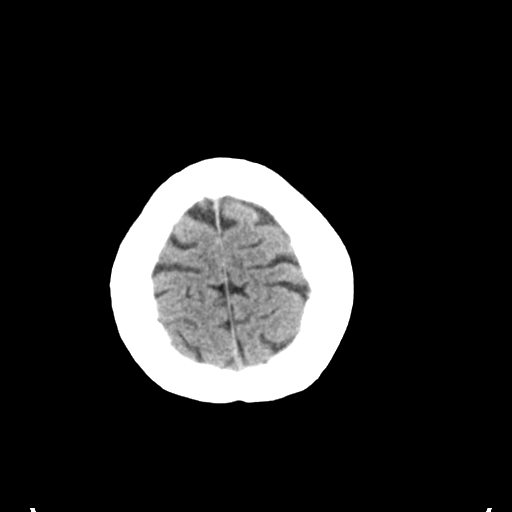
[im 26/28  brain]
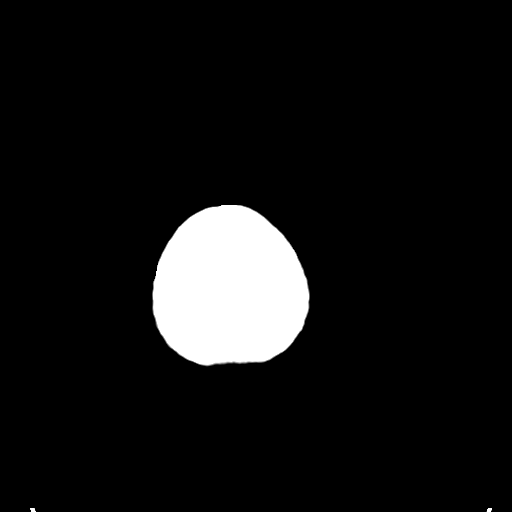
[im 26/28  bone]
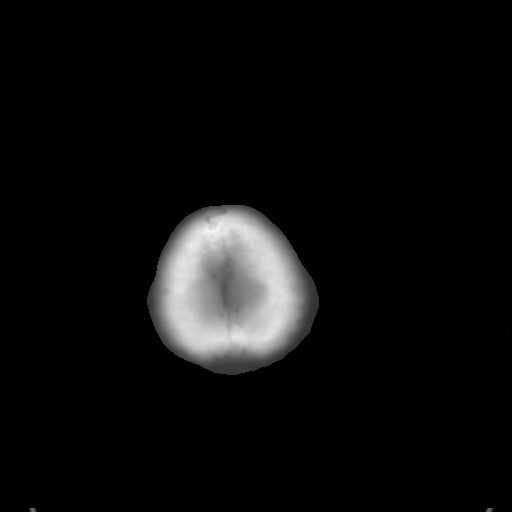

[Series 4: coronal soft tissue · coronal · 0.28mm/px · 3 of 65 slices shown]
[im 22/65  brain]
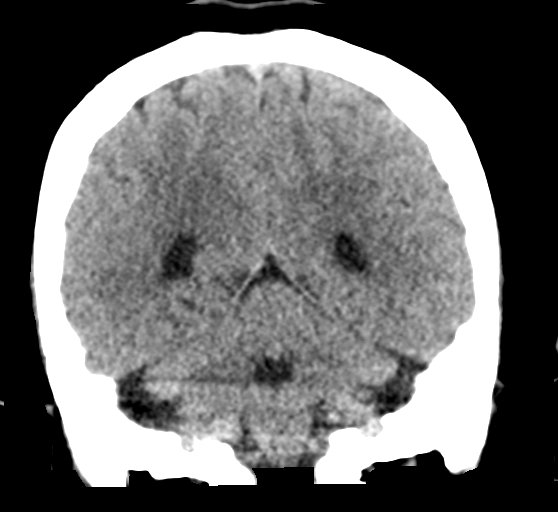
[im 29/65  brain]
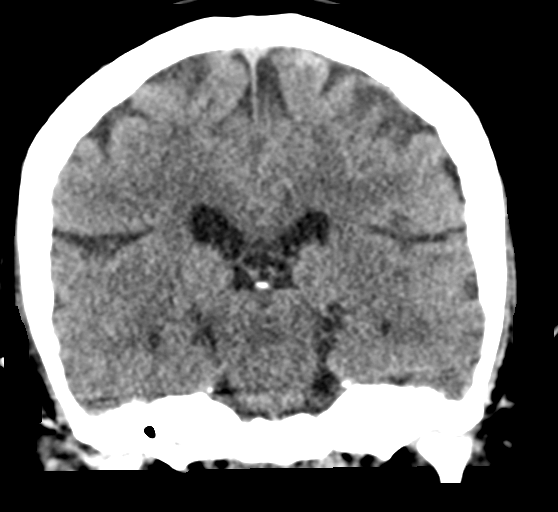
[im 36/65  brain]
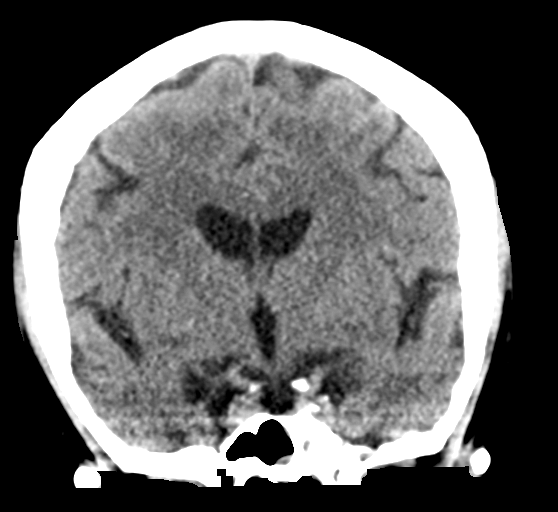

[Series 5: sagittal soft tissue · sagittal · 0.27mm/px · 3 of 48 slices shown]
[im 16/48  brain]
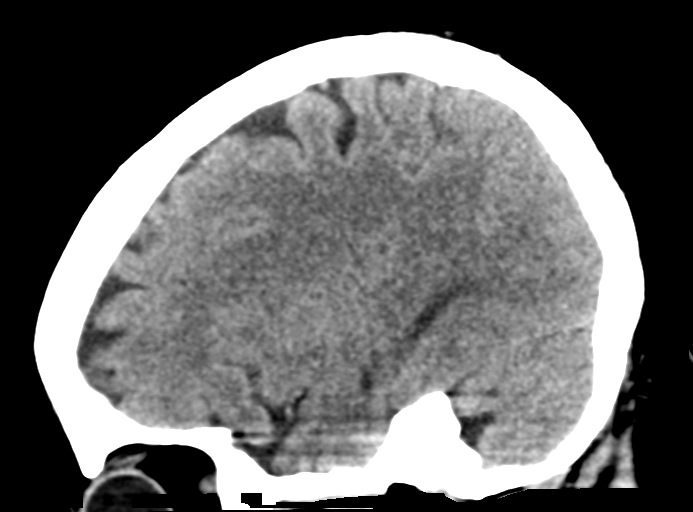
[im 24/48  brain]
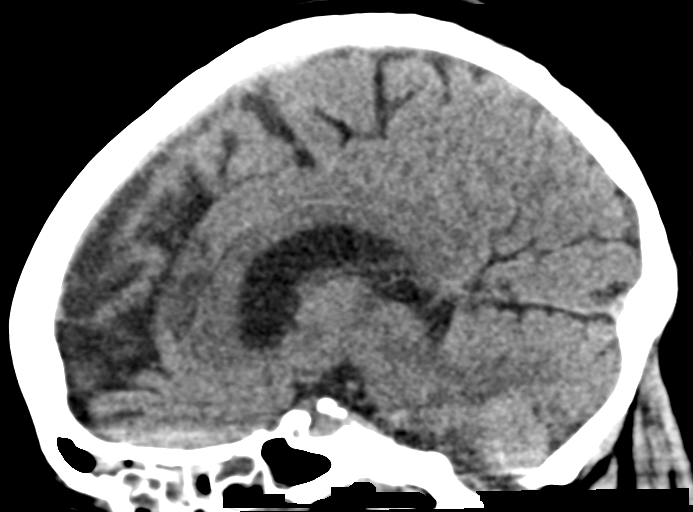
[im 32/48  brain]
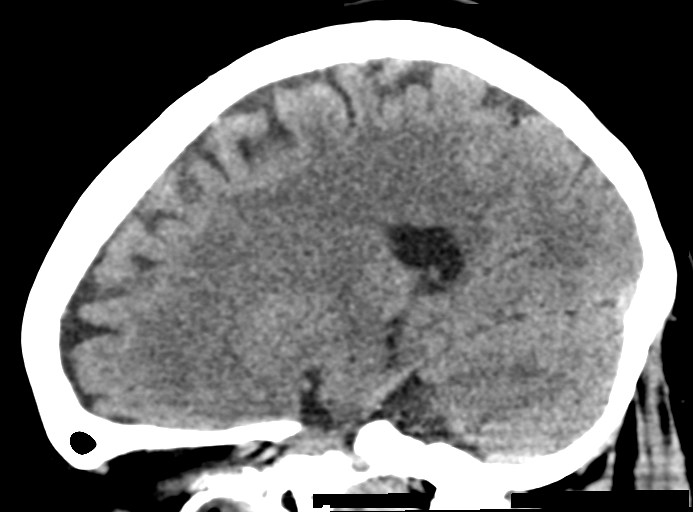

[15 of 45 positions shown; findings below may reference images not displayed]

FINDINGS: Brain: No evidence of acute infarction, hemorrhage, hydrocephalus,
extra-axial collection or mass lesion/mass effect.

Vascular: No hyperdense vessel or unexpected calcification.

Skull: Normal. Negative for fracture or focal lesion.

Sinuses/Orbits: There is significant sinus disease. There is marked
mucosal thickening throughout the ethmoid air cells which are almost
completely opacified. Mucosal thickening mostly opacifies the left
and opacifies the right superior maxillary sinuses, which are
incompletely imaged. Left sphenoid sinus is opacified. Moderate
mucosal thickening is noted along the inferior left frontal sinus
with mild inferior right frontal sinus mucosal thickening. There is
mild right sphenoid sinus mucosal thickening.

The mastoid air cells are completely opacified bilaterally. There is
significant opacification of both middle ear cavities. No evidence
of bone resorption.

Globes and orbits are unremarkable.

Other: None.
IMPRESSION: 1. No intracranial abnormality.
2. There is significant sinus disease as well as bilateral mastoid
air cell opacification and opacification of most of the middle ear
cavities bilaterally.

## 2018-06-19 ENCOUNTER — Other Ambulatory Visit: Payer: Self-pay

## 2018-06-19 MED ORDER — AMLODIPINE BESYLATE 2.5 MG PO TABS: 2.5000 mg | ORAL_TABLET | Freq: Every day | ORAL | 1 refills | Status: DC

## 2018-06-19 MED ORDER — LISINOPRIL 2.5 MG PO TABS: 2.5000 mg | ORAL_TABLET | Freq: Every day | ORAL | 1 refills | Status: DC

## 2018-07-04 ENCOUNTER — Emergency Department: Payer: Medicare HMO

## 2018-07-04 ENCOUNTER — Emergency Department
Admission: EM | Admit: 2018-07-04 | Discharge: 2018-07-04 | Disposition: A | Discharge status: Home / Self Care | Payer: Medicare HMO | Attending: Emergency Medicine | Admitting: Emergency Medicine

## 2018-07-04 ENCOUNTER — Encounter: Payer: Self-pay | Admitting: Emergency Medicine

## 2018-07-04 ENCOUNTER — Other Ambulatory Visit: Payer: Self-pay

## 2018-07-04 ENCOUNTER — Ambulatory Visit: Payer: Medicare HMO | Admitting: Gastroenterology

## 2018-07-04 DIAGNOSIS — R220 Localized swelling, mass and lump, head: Secondary | ICD-10-CM

## 2018-07-04 DIAGNOSIS — I130 Hypertensive heart and chronic kidney disease with heart failure and stage 1 through stage 4 chronic kidney disease, or unspecified chronic kidney disease: Secondary | ICD-10-CM | POA: Insufficient documentation

## 2018-07-04 DIAGNOSIS — F419 Anxiety disorder, unspecified: Secondary | ICD-10-CM | POA: Diagnosis not present

## 2018-07-04 DIAGNOSIS — L03811 Cellulitis of head [any part, except face]: Secondary | ICD-10-CM | POA: Diagnosis not present

## 2018-07-04 DIAGNOSIS — E1122 Type 2 diabetes mellitus with diabetic chronic kidney disease: Secondary | ICD-10-CM | POA: Diagnosis not present

## 2018-07-04 DIAGNOSIS — J449 Chronic obstructive pulmonary disease, unspecified: Secondary | ICD-10-CM | POA: Diagnosis not present

## 2018-07-04 DIAGNOSIS — J45909 Unspecified asthma, uncomplicated: Secondary | ICD-10-CM | POA: Insufficient documentation

## 2018-07-04 DIAGNOSIS — I2510 Atherosclerotic heart disease of native coronary artery without angina pectoris: Secondary | ICD-10-CM | POA: Diagnosis not present

## 2018-07-04 DIAGNOSIS — E039 Hypothyroidism, unspecified: Secondary | ICD-10-CM | POA: Insufficient documentation

## 2018-07-04 DIAGNOSIS — Z9049 Acquired absence of other specified parts of digestive tract: Secondary | ICD-10-CM | POA: Diagnosis not present

## 2018-07-04 DIAGNOSIS — I509 Heart failure, unspecified: Secondary | ICD-10-CM | POA: Diagnosis not present

## 2018-07-04 DIAGNOSIS — N183 Chronic kidney disease, stage 3 (moderate): Secondary | ICD-10-CM | POA: Insufficient documentation

## 2018-07-04 DIAGNOSIS — Z794 Long term (current) use of insulin: Secondary | ICD-10-CM | POA: Diagnosis not present

## 2018-07-04 DIAGNOSIS — Z79899 Other long term (current) drug therapy: Secondary | ICD-10-CM | POA: Diagnosis not present

## 2018-07-04 DIAGNOSIS — F319 Bipolar disorder, unspecified: Secondary | ICD-10-CM | POA: Diagnosis not present

## 2018-07-04 LAB — BASIC METABOLIC PANEL
Anion gap: 7 (ref 5–15)
BUN: 27 mg/dL — AB (ref 6–20)
CHLORIDE: 105 mmol/L (ref 98–111)
CO2: 28 mmol/L (ref 22–32)
CREATININE: 1.2 mg/dL — AB (ref 0.44–1.00)
Calcium: 9.6 mg/dL (ref 8.9–10.3)
GFR calc Af Amer: 58 mL/min — ABNORMAL LOW (ref >60)
GFR calc non Af Amer: 50 mL/min — ABNORMAL LOW (ref >60)
Glucose, Bld: 138 mg/dL — ABNORMAL HIGH (ref 70–99)
Potassium: 4.2 mmol/L (ref 3.5–5.1)
SODIUM: 140 mmol/L (ref 135–145)

## 2018-07-04 LAB — CBC
HCT: 37.9 % (ref 36.0–46.0)
HEMOGLOBIN: 11.9 g/dL — AB (ref 12.0–15.0)
MCH: 27.5 pg (ref 26.0–34.0)
MCHC: 31.4 g/dL (ref 30.0–36.0)
MCV: 87.5 fL (ref 80.0–100.0)
Platelets: 262 10*3/uL (ref 150–400)
RBC: 4.33 MIL/uL (ref 3.87–5.11)
RDW: 15 % (ref 11.5–15.5)
WBC: 7.7 10*3/uL (ref 4.0–10.5)
nRBC: 0 % (ref 0.0–0.2)

## 2018-07-04 MED ORDER — ONDANSETRON 4 MG PO TBDP: 4.0000 mg | ORAL_TABLET | Freq: Three times a day (TID) | ORAL | 0 refills | Status: DC | PRN

## 2018-07-04 MED ORDER — ONDANSETRON HCL 4 MG/2ML IJ SOLN
4.0000 mg | Freq: Once | INTRAMUSCULAR | Status: AC
  Administered 2018-07-04: 4 mg via INTRAVENOUS
  Filled 2018-07-04: qty 2

## 2018-07-04 MED ORDER — KETOROLAC TROMETHAMINE 30 MG/ML IJ SOLN
30.0000 mg | Freq: Once | INTRAMUSCULAR | Status: AC
  Administered 2018-07-04: 30 mg via INTRAVENOUS
  Filled 2018-07-04: qty 1

## 2018-07-04 MED ORDER — DOXYCYCLINE HYCLATE 100 MG PO TABS: 100.0000 mg | ORAL_TABLET | Freq: Two times a day (BID) | ORAL | 0 refills | Status: DC

## 2018-07-04 MED ORDER — IOHEXOL 300 MG/ML  SOLN
75.0000 mL | Freq: Once | INTRAMUSCULAR | Status: AC | PRN
  Administered 2018-07-04: 75 mL via INTRAVENOUS

## 2018-07-04 MED ORDER — KETOROLAC TROMETHAMINE 10 MG PO TABS: 10.0000 mg | ORAL_TABLET | Freq: Four times a day (QID) | ORAL | 0 refills | Status: DC | PRN

## 2018-07-04 NOTE — ED Triage Notes (Signed)
Patient to ER for c/o pain and swelling to forehead x3 days. States pain worsens with movement. Denies any fevers.

## 2018-07-04 NOTE — ED Notes (Signed)
Patient returned from [place]. Using restroom at this time.

## 2018-07-04 NOTE — ED Provider Notes (Signed)
[facility] Emergency Department Provider Note   ____________________________________________    I have reviewed the triage vital signs and the nursing notes.   HISTORY  Chief Complaint Facial Swelling     HPI [name] is a 52 y.o. female presents with complaints of forehead swelling.  Patient reports over the last 3 days she has had progressively worsening swelling of the center of her forehead.  She reports it is mildly tender.  She is never had this before.  She denies trauma or injury.  No fevers or chills.  She reports she does have a area of redness on her scalp which is also mildly tender.  No discharge or erythema related to the forehead.  Past Medical History:  Diagnosis Date  . Anxiety   . Arthritis   . Asthma   . Bipolar depression ([place])   . CHF (congestive heart failure) ([place])   . Chronic headaches    migraines have lessened  . Chronic kidney disease    ddstage 111  . COPD (chronic obstructive pulmonary disease) ([place])   . Coronary artery disease   . Depression   . Diabetic neuropathy ([place])   . Dyspnea    uses inhalers for this  . Dysrhythmia    rapid  . Fatty liver   . GERD (gastroesophageal reflux disease)   . Headache   . Heart attack ([place]) 2009  . Hyperlipidemia   . Hypertension   . Hypomagnesemia   . Hypothyroid   . Irregular heart rhythm   . Liver lesion    Favored to be benign on CT; MRI of abd w/contract in Aug 2016. Liver Biopsy-Negative, March 2016  . Morbid obesity ([place])   . OSA (obstructive sleep apnea)   . Peripheral vascular disease ([place])   . Pinched nerve    Back  . Pneumonia   . RLS (restless legs syndrome)   . Seasonal allergies   . Sleep apnea    uses oxygen at night 2L d/t cpap did not fit  . Type 2 diabetes, uncontrolled, with neuropathy ([place]) 10/15/2015   Overview:  Microalbumin 7.7 12/2010: Hgb A1c 10.2% 12/2010 and 9.7% 03/2011, eye exam 10/2009 [facility]   . Urinary incontinence   .  Vitamin D deficiency     Patient Active Problem List   Diagnosis Date Noted  . Encounter for Medicare annual wellness exam 10/30/2017  . Patient is full code 10/30/2017  . Disorder of bursae of shoulder region 10/09/2017  . Coronary artery disease 10/09/2017  . Plantar fascial fibromatosis 10/09/2017  . Tendinitis of wrist 10/09/2017  . Schizoaffective disorder, bipolar type ([place]) 09/28/2017  . Chest pain 09/10/2017  . Lobar pneumonia ([place]) 06/05/2017  . Multifocal pneumonia 05/27/2017  . Pleuritic chest pain 05/27/2017  . Hypertriglyceridemia 12/21/2016  . Pansinusitis 11/15/2016  . 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 Breast cancer screening 09/28/2016  . Urine frequency 09/28/2016  . Preventative health care 09/28/2016  . Fatigue 05/01/2016  . Urinary tract infection 03/14/2016  . Adenomatous colon polyp 10/15/2015  . Airway hyperreactivity 10/15/2015  . Bipolar affective disorder ([place]) 10/15/2015  . CN (constipation) 10/15/2015  . CAFL (chronic airflow limitation) ([place]) 10/15/2015  . Drug-induced hepatic toxicity 10/15/2015  . Blood in feces 10/15/2015  . HLD (hyperlipidemia) 10/15/2015  . Hypoxemia 10/15/2015  . NASH (nonalcoholic steatohepatitis) 10/15/2015  . Morbid obesity ([place]) 10/15/2015  . Restless leg syndrome 10/15/2015  . Type 2 diabetes, uncontrolled,  with neuropathy ([place]) 10/15/2015  . Elevated alkaline phosphatase level 09/22/2015  . Urinary bladder incontinence 09/22/2015  . Low back pain with left-sided sciatica 08/18/2015  . Encounter for monitoring tricyclic antidepressant therapy 05/13/2015  . Bipolar disorder ([place]) 05/11/2015  . Liver disease 05/11/2015  . Apnea, sleep 05/05/2015  . Heart valve disease 05/05/2015  . Chronic kidney disease, stage III (moderate) (HCC) 03/15/2015  . Migraine 03/12/2015  . COPD (chronic obstructive pulmonary disease) ([place]) 01/20/2015  . 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 GERD  (gastroesophageal reflux disease) 01/20/2015  . Hypothyroid   . Vitamin D deficiency   . Urinary incontinence   . Liver lesion   . Hypomagnesemia   . Obstructive sleep apnea 10/20/2013  . Vaginitis 12/19/2011  . PERIPHERAL NEUROPATHY 03/04/2009  . HEMORRHOIDS, EXTERNAL 09/03/2008  . ALLERGIC RHINITIS 07/04/2007  . ASTHMA, PERSISTENT, MODERATE 07/04/2007  . INTERSTITIAL CYSTITIS 07/04/2007  . HYPERTENSION, BENIGN ESSENTIAL 06/19/2007    Past Surgical History:  Procedure Laterality Date  . ABDOMINAL HYSTERECTOMY    . ABDOMINAL HYSTERECTOMY     Partial  . bladder stim  2007  . BLADDER SURGERY     x 2. tacking of bladder and the other was the stimulator placement  . CHOLECYSTECTOMY    . INTERSTIM-GENERATOR CHANGE N/A 12/31/2017   Procedure: INTERSTIM-GENERATOR CHANGE;  Surgeon: Bjorn Loser, MD;  Location: [facility] ORS;  Service: Urology;  Laterality: N/A;  . LIVER BIOPSY  March 2016   Negative  . LUNG BIOPSY  2015   small spot on lung  . TUBAL LIGATION      Prior to Admission medications   Medication Sig Start Date End Date Taking? Authorizing Provider  albuterol (PROVENTIL HFA;VENTOLIN HFA) 108 (90 Base) MCG/ACT inhaler Inhale 2 puffs into the lungs every 4 (four) hours as needed for wheezing or shortness of breath.    [provider]  amLODipine (NORVASC) 2.5 MG tablet Take 1 tablet (2.5 mg total) by mouth daily. 06/19/18   Arnetha Courser, MD  atorvastatin (LIPITOR) 80 MG tablet Take 80 mg by mouth at bedtime.     [provider]  budesonide-formoterol (SYMBICORT) 160-4.5 MCG/ACT inhaler Inhale 2 puffs into the lungs 2 (two) times daily.    [provider]  cholecalciferol (VITAMIN D) 1000 units tablet Take 1,000 Units by mouth daily.    [provider]  divalproex (DEPAKOTE ER) 250 MG 24 hr tablet divalproex ER 250 mg tablet,extended release 24 hr    [provider]  doxycycline (VIBRA-TABS) 100 MG tablet Take 1 tablet (100 mg  total) by mouth 2 (two) times daily. 07/04/18   Lavonia Drafts, MD  DULoxetine (CYMBALTA) 60 MG capsule Take 120 mg by mouth daily.    [provider]  empagliflozin (JARDIANCE) 25 MG TABS tablet Take 25 mg by mouth daily.    [provider]  ezetimibe (ZETIA) 10 MG tablet Take 10 mg by mouth daily.  01/05/17   [provider]  fenofibrate micronized (LOFIBRA) 134 MG capsule Take 134 mg by mouth at bedtime.     [provider]  fluticasone (FLONASE) 50 MCG/ACT nasal spray Place 2 sprays into both nostrils daily. 04/16/18   Arnetha Courser, MD  gabapentin (NEURONTIN) 300 MG capsule Take 1 capsule (300 mg total) by mouth 2 (two) times daily. 03/13/18   Edrick Kins, DPM  HUMULIN R U-500 KWIKPEN 500 UNIT/ML kwikpen Inject 60 Units as directed 3 (three) times daily.     [provider]  hydrOXYzine (VISTARIL) 50 MG capsule Take 50-100 mg by mouth daily as needed for anxiety.     [provider]  insulin aspart (NOVOLOG) 100 UNIT/ML injection Inject 5-25 Units into the skin 3 (three) times daily before meals.     [provider]  ketorolac (TORADOL) 10 MG tablet Take 1 tablet (10 mg total) by mouth every 6 (six) hours as needed. 07/04/18   Lavonia Drafts, MD  lamoTRIgine (LAMICTAL) 200 MG tablet Take 200 mg by mouth 2 (two) times daily.     [provider]  levothyroxine (SYNTHROID, LEVOTHROID) 75 MCG tablet Take 1 tablet (75 mcg total) by mouth daily. 10/24/17   Arnetha Courser, MD  linaclotide (LINZESS) 72 MCG capsule Take 1 capsule (72 mcg total) by mouth daily before breakfast. 10/30/17   Lada, Satira Anis, MD  lisinopril (PRINIVIL,ZESTRIL) 2.5 MG tablet Take 1 tablet (2.5 mg total) by mouth daily. 06/19/18   Arnetha Courser, MD  metoprolol tartrate (LOPRESSOR) 25 MG tablet Take one tablet 25 mg in the am and 1/2 tab in the pm 04/03/18   Minna Merritts, MD  ondansetron (ZOFRAN ODT) 4 MG disintegrating tablet Take 1 tablet (4 mg total)  by mouth every 8 (eight) hours as needed for nausea or vomiting. 07/04/18   Lavonia Drafts, MD  ondansetron (ZOFRAN) 4 MG tablet Take 1 tablet (4 mg total) by mouth every 8 (eight) hours as needed for nausea or vomiting. 04/16/18   Lada, Satira Anis, MD  pantoprazole (PROTONIX) 40 MG tablet Take 1 tablet (40 mg total) by mouth daily. 10/30/17   Lada, Satira Anis, MD  Prasterone (INTRAROSA) 6.5 MG INST Place 1 suppository vaginally daily. 11/27/17   Zara Council A, PA-C  prochlorperazine (COMPAZINE) 5 MG tablet Take 1 tablet (5 mg total) by mouth every 6 (six) hours as needed for nausea or vomiting. Patient not taking: Reported on 04/16/2018 12/04/17   Eula Listen, MD  QUEtiapine (SEROQUEL) 400 MG tablet Take 400 mg by mouth at bedtime.    [provider]  traMADol (ULTRAM) 50 MG tablet Take 1 tablet (50 mg total) by mouth every 6 (six) hours as needed. 12/31/17 12/31/18  Bjorn Loser, MD  traZODone (DESYREL) 100 MG tablet Take 100 mg by mouth at bedtime.    [provider]  TRULICITY 1.5 UY/4.0HK SOPN  03/26/18   [provider]     Allergies Mirtazapine; Morphine and related; Sulfamethoxazole-trimethoprim; Amoxicillin er; Codeine; Hydrocodone-acetaminophen; Keflex [cephalexin]; and Prasterone  Family History  Problem Relation Age of Onset  . Diabetes Mother   . Heart disease Mother   . Hyperlipidemia Mother   . Hypertension Mother   . Kidney disease Mother   . Mental illness Mother   . Hypothyroidism Mother   . Stroke Mother   . Osteoporosis Mother   . Glaucoma Mother   . Congestive Heart Failure Mother   . Hypertension Father   . Asthma Daughter   . Cancer Daughter        throat  . Bipolar disorder Daughter   . Heart disease Maternal Grandfather   . Rheum arthritis Maternal Grandfather   . Cancer Maternal Grandfather        liver  . Kidney disease Maternal Grandfather   . Cancer Paternal Grandmother        lung  . Kidney disease Brother   .  Breast cancer Maternal Grandmother 60  . Bipolar disorder Daughter   . Hypertension Daughter   .  Restless legs syndrome Daughter     Social History Social History   Tobacco Use  . Smoking status: Never Smoker  . Smokeless tobacco: Never Used  Substance Use Topics  . Alcohol use: No  . Drug use: No    Review of Systems  Constitutional: No fevers Eyes: No visual changes.  ENT: History of sinusitis Cardiovascular: Denies chest pain. Respiratory: Denies shortness of breath. Gastrointestinal: No abdominal pain.  No nausea, no vomiting.   Genitourinary: Negative for dysuria. Musculoskeletal: Negative for back pain. Skin: As above Neurological: Negative for  weakness   ____________________________________________   PHYSICAL EXAM:  VITAL SIGNS: ED Triage Vitals  Enc Vitals Group     BP 07/04/18 0854 131/71     Pulse Rate 07/04/18 0854 77     Resp 07/04/18 0854 20     Temp 07/04/18 0854 98.4 F (36.9 C)     Temp Source 07/04/18 0854 Oral     SpO2 07/04/18 0854 98 %     Weight 07/04/18 0858 82.1 kg (181 lb)     Height 07/04/18 0858 1.499 m (4' 11" )     Head Circumference --      Peak Flow --      Pain Score 07/04/18 0857 6     Pain Loc --      Pain Edu? --      Excl. in [place]? --     Constitutional: Alert and oriented.  Eyes: Conjunctivae are normal.  Head: Central forehead swelling, significant, clearly demarcated, no erythema, not significantly fluctuant, mildly tender.  No erythema Nose: No congestion/rhinnorhea. Mouth/Throat: Mucous membranes are moist.   Neck:  Painless ROM Cardiovascular: Normal rate, regular rhythm. Grossly normal heart sounds.  Good peripheral circulation. Respiratory: Normal respiratory effort.  No retractions. Lungs CTAB. Gastrointestinal: Soft and nontender. No distention.    Musculoskeletal:  Warm and well perfused Neurologic:  Normal speech and language. No gross focal neurologic deficits are appreciated.  Skin:  Skin is warm, dry  and intact. No rash noted. Psychiatric: Mood and affect are normal. Speech and behavior are normal.  ____________________________________________   LABS (all labs ordered are listed, but only abnormal results are displayed)  Labs Reviewed  CBC - Abnormal; Notable for the following components:      Result Value   Hemoglobin 11.9 (*)    All other components within normal limits  BASIC METABOLIC PANEL - Abnormal; Notable for the following components:   Glucose, Bld 138 (*)    BUN 27 (*)    Creatinine, Ser 1.20 (*)    GFR calc non Af Amer 50 (*)    GFR calc Af Amer 58 (*)    All other components within normal limits   ____________________________________________  EKG  None ____________________________________________  RADIOLOGY  Discussed with radiologist, they recommend CT head with IV contrast ____________________________________________   PROCEDURES  Procedure(s) performed: No  Procedures   Critical Care performed:No ____________________________________________   INITIAL IMPRESSION / ASSESSMENT AND PLAN / ED COURSE  Pertinent labs & imaging results that were available during my care of the patient were reviewed by me and considered in my medical decision making (see chart for details).  Patient presents with forehead swelling, review of medical records demonstrates a history of sinusitis raising concern for possible pots puffy tumor.  No reports of trauma.  She also does have a small area of erythema on her scalp but this does not appear contiguous with the area of swelling on her forehead.  Patient  given IV Toradol and IV Zofran, patient has a morphine allergy.  CT scan does not demonstrate any evidence of a periosteal abscess or osteomyelitis.  Possibly some mild cellulitis likely extension from the skin break on her scalp.  We will treat with doxycycline, she requested refill of Zofran which we have done.  Close follow-up with PCP      ____________________________________________   FINAL CLINICAL IMPRESSION(S) / ED DIAGNOSES  Final diagnoses:  Facial swelling  Cellulitis of head except face        Note:  This document was prepared using Dragon voice recognition software and may include unintentional dictation errors.    Lavonia Drafts, MD 07/04/18 1226

## 2018-07-04 NOTE — ED Notes (Signed)
Pt's forehead swollen but with appropriate color. Tender.

## 2018-07-04 NOTE — ED Notes (Signed)
Patient transported to CT 

## 2018-07-06 ENCOUNTER — Emergency Department
Admission: EM | Admit: 2018-07-06 | Discharge: 2018-07-06 | Disposition: A | Discharge status: Home / Self Care | Payer: Medicare HMO | Attending: Emergency Medicine | Admitting: Emergency Medicine

## 2018-07-06 ENCOUNTER — Emergency Department: Payer: Medicare HMO

## 2018-07-06 ENCOUNTER — Other Ambulatory Visit: Payer: Self-pay

## 2018-07-06 DIAGNOSIS — R0602 Shortness of breath: Secondary | ICD-10-CM

## 2018-07-06 DIAGNOSIS — I509 Heart failure, unspecified: Secondary | ICD-10-CM | POA: Insufficient documentation

## 2018-07-06 DIAGNOSIS — I130 Hypertensive heart and chronic kidney disease with heart failure and stage 1 through stage 4 chronic kidney disease, or unspecified chronic kidney disease: Secondary | ICD-10-CM | POA: Insufficient documentation

## 2018-07-06 DIAGNOSIS — Z9981 Dependence on supplemental oxygen: Secondary | ICD-10-CM | POA: Diagnosis not present

## 2018-07-06 DIAGNOSIS — J449 Chronic obstructive pulmonary disease, unspecified: Secondary | ICD-10-CM | POA: Insufficient documentation

## 2018-07-06 DIAGNOSIS — L02811 Cutaneous abscess of head [any part, except face]: Secondary | ICD-10-CM | POA: Diagnosis not present

## 2018-07-06 DIAGNOSIS — E1140 Type 2 diabetes mellitus with diabetic neuropathy, unspecified: Secondary | ICD-10-CM | POA: Diagnosis not present

## 2018-07-06 DIAGNOSIS — L0291 Cutaneous abscess, unspecified: Secondary | ICD-10-CM

## 2018-07-06 DIAGNOSIS — N183 Chronic kidney disease, stage 3 (moderate): Secondary | ICD-10-CM | POA: Diagnosis not present

## 2018-07-06 DIAGNOSIS — Z79899 Other long term (current) drug therapy: Secondary | ICD-10-CM | POA: Diagnosis not present

## 2018-07-06 DIAGNOSIS — E1122 Type 2 diabetes mellitus with diabetic chronic kidney disease: Secondary | ICD-10-CM | POA: Insufficient documentation

## 2018-07-06 DIAGNOSIS — Z794 Long term (current) use of insulin: Secondary | ICD-10-CM | POA: Diagnosis not present

## 2018-07-06 DIAGNOSIS — E039 Hypothyroidism, unspecified: Secondary | ICD-10-CM | POA: Insufficient documentation

## 2018-07-06 LAB — CBC WITH DIFFERENTIAL/PLATELET
Abs Immature Granulocytes: 0.04 10*3/uL (ref 0.00–0.07)
BASOS ABS: 0 10*3/uL (ref 0.0–0.1)
BASOS PCT: 1 %
EOS ABS: 0.2 10*3/uL (ref 0.0–0.5)
EOS PCT: 3 %
HEMATOCRIT: 35 % — AB (ref 36.0–46.0)
Hemoglobin: 10.9 g/dL — ABNORMAL LOW (ref 12.0–15.0)
IMMATURE GRANULOCYTES: 1 %
Lymphocytes Relative: 28 %
Lymphs Abs: 1.7 10*3/uL (ref 0.7–4.0)
MCH: 27.3 pg (ref 26.0–34.0)
MCHC: 31.1 g/dL (ref 30.0–36.0)
MCV: 87.7 fL (ref 80.0–100.0)
Monocytes Absolute: 0.5 10*3/uL (ref 0.1–1.0)
Monocytes Relative: 8 %
NEUTROS PCT: 59 %
NRBC: 0 % (ref 0.0–0.2)
Neutro Abs: 3.6 10*3/uL (ref 1.7–7.7)
PLATELETS: 229 10*3/uL (ref 150–400)
RBC: 3.99 MIL/uL (ref 3.87–5.11)
RDW: 14.6 % (ref 11.5–15.5)
WBC: 6 10*3/uL (ref 4.0–10.5)

## 2018-07-06 LAB — BASIC METABOLIC PANEL
ANION GAP: 11 (ref 5–15)
BUN: 27 mg/dL — ABNORMAL HIGH (ref 6–20)
CALCIUM: 9 mg/dL (ref 8.9–10.3)
CO2: 26 mmol/L (ref 22–32)
CREATININE: 1.27 mg/dL — AB (ref 0.44–1.00)
Chloride: 103 mmol/L (ref 98–111)
GFR, EST AFRICAN AMERICAN: 54 mL/min — AB (ref >60)
GFR, EST NON AFRICAN AMERICAN: 47 mL/min — AB (ref >60)
GLUCOSE: 253 mg/dL — AB (ref 70–99)
Potassium: 4.1 mmol/L (ref 3.5–5.1)
Sodium: 140 mmol/L (ref 135–145)

## 2018-07-06 LAB — TROPONIN I: Troponin I: <0.03 ng/mL (ref <0.03)

## 2018-07-06 LAB — BRAIN NATRIURETIC PEPTIDE: B NATRIURETIC PEPTIDE 5: 65 pg/mL (ref 0.0–100.0)

## 2018-07-06 MED ORDER — OXYCODONE-ACETAMINOPHEN 5-325 MG PO TABS
1.0000 | ORAL_TABLET | Freq: Once | ORAL | Status: AC
  Administered 2018-07-06: 1 via ORAL
  Filled 2018-07-06: qty 1

## 2018-07-06 NOTE — ED Provider Notes (Signed)
[facility] Emergency Department Provider Note  ____________________________________________   First MD Initiated Contact with Patient 07/06/18 [phone]     (approximate)  I have reviewed the triage vital signs and the nursing notes.   HISTORY  Chief Complaint No chief complaint on file.   HPI [name] is a 52 y.o. female with a history of bipolar disorder, CHF and COPD on 2 L of overnight oxygen at home was presented to the emergency department shortness of breath that started this morning.  Says it feels like a "tightness" when she breathes but does not stated the tightness is focal or that there is any pain.  EMS found the patient to be 90 to 93% on her home 2 L nasal cannula oxygen to the increased to 3 L and the oxygen saturation went up to 96%.  Patient also complaining of a left frontal scalp area of redness which he says is getting bigger ever since being the emergency department several days ago.  Says that she had a large area of erythema across her forehead which is now resolved but that this other area on the left side of her forehead/scalp as well as her upper back have been worsening.  She continues to take doxycycline.  Also has noticed bags under her eyes since having these issues.  The patient is diabetic and has not taken her insulin today.   Past Medical History:  Diagnosis Date  . Anxiety   . Arthritis   . Asthma   . Bipolar depression ([place])   . CHF (congestive heart failure) ([place])   . Chronic headaches    migraines have lessened  . Chronic kidney disease    ddstage 111  . COPD (chronic obstructive pulmonary disease) ([place])   . Coronary artery disease   . Depression   . Diabetic neuropathy ([place])   . Dyspnea    uses inhalers for this  . Dysrhythmia    rapid  . Fatty liver   . GERD (gastroesophageal reflux disease)   . Headache   . Heart attack ([place]) 2009  . Hyperlipidemia   . Hypertension   . Hypomagnesemia   . Hypothyroid     . Irregular heart rhythm   . Liver lesion    Favored to be benign on CT; MRI of abd w/contract in Aug 2016. Liver Biopsy-Negative, March 2016  . Morbid obesity ([place])   . OSA (obstructive sleep apnea)   . Peripheral vascular disease ([place])   . Pinched nerve    Back  . Pneumonia   . RLS (restless legs syndrome)   . Seasonal allergies   . Sleep apnea    uses oxygen at night 2L d/t cpap did not fit  . Type 2 diabetes, uncontrolled, with neuropathy ([place]) 10/15/2015   Overview:  Microalbumin 7.7 12/2010: Hgb A1c 10.2% 12/2010 and 9.7% 03/2011, eye exam 10/2009 [facility]   . Urinary incontinence   . Vitamin D deficiency     Patient Active Problem List   Diagnosis Date Noted  . Encounter for Medicare annual wellness exam 10/30/2017  . Patient is full code 10/30/2017  . Disorder of bursae of shoulder region 10/09/2017  . Coronary artery disease 10/09/2017  . Plantar fascial fibromatosis 10/09/2017  . Tendinitis of wrist 10/09/2017  . Schizoaffective disorder, bipolar type ([place]) 09/28/2017  . Chest pain 09/10/2017  . Lobar pneumonia ([place]) 06/05/2017  . Multifocal pneumonia 05/27/2017  . Pleuritic chest pain 05/27/2017  . Hypertriglyceridemia 12/21/2016  .  Pansinusitis 11/15/2016  . 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 Breast cancer screening 09/28/2016  . Urine frequency 09/28/2016  . Preventative health care 09/28/2016  . Fatigue 05/01/2016  . Urinary tract infection 03/14/2016  . Adenomatous colon polyp 10/15/2015  . Airway hyperreactivity 10/15/2015  . Bipolar affective disorder ([place]) 10/15/2015  . CN (constipation) 10/15/2015  . CAFL (chronic airflow limitation) ([place]) 10/15/2015  . Drug-induced hepatic toxicity 10/15/2015  . Blood in feces 10/15/2015  . HLD (hyperlipidemia) 10/15/2015  . Hypoxemia 10/15/2015  . NASH (nonalcoholic steatohepatitis) 10/15/2015  . Morbid obesity ([place]) 10/15/2015  . Restless leg syndrome 10/15/2015  . Type 2 diabetes,  uncontrolled, with neuropathy ([place]) 10/15/2015  . Elevated alkaline phosphatase level 09/22/2015  . Urinary bladder incontinence 09/22/2015  . Low back pain with left-sided sciatica 08/18/2015  . Encounter for monitoring tricyclic antidepressant therapy 05/13/2015  . Bipolar disorder ([place]) 05/11/2015  . Liver disease 05/11/2015  . Apnea, sleep 05/05/2015  . Heart valve disease 05/05/2015  . Chronic kidney disease, stage III (moderate) (HCC) 03/15/2015  . Migraine 03/12/2015  . COPD (chronic obstructive pulmonary disease) ([place]) 01/20/2015  . 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 GERD (gastroesophageal reflux disease) 01/20/2015  . Hypothyroid   . Vitamin D deficiency   . Urinary incontinence   . Liver lesion   . Hypomagnesemia   . Obstructive sleep apnea 10/20/2013  . Vaginitis 12/19/2011  . PERIPHERAL NEUROPATHY 03/04/2009  . HEMORRHOIDS, EXTERNAL 09/03/2008  . ALLERGIC RHINITIS 07/04/2007  . ASTHMA, PERSISTENT, MODERATE 07/04/2007  . INTERSTITIAL CYSTITIS 07/04/2007  . HYPERTENSION, BENIGN ESSENTIAL 06/19/2007    Past Surgical History:  Procedure Laterality Date  . ABDOMINAL HYSTERECTOMY    . ABDOMINAL HYSTERECTOMY     Partial  . bladder stim  2007  . BLADDER SURGERY     x 2. tacking of bladder and the other was the stimulator placement  . CHOLECYSTECTOMY    . INTERSTIM-GENERATOR CHANGE N/A 12/31/2017   Procedure: INTERSTIM-GENERATOR CHANGE;  Surgeon: Bjorn Loser, MD;  Location: [facility] ORS;  Service: Urology;  Laterality: N/A;  . LIVER BIOPSY  March 2016   Negative  . LUNG BIOPSY  2015   small spot on lung  . TUBAL LIGATION      Prior to Admission medications   Medication Sig Start Date End Date Taking? Authorizing Provider  albuterol (PROVENTIL HFA;VENTOLIN HFA) 108 (90 Base) MCG/ACT inhaler Inhale 2 puffs into the lungs every 4 (four) hours as needed for wheezing or shortness of breath.    [provider]  amLODipine (NORVASC) 2.5 MG tablet  Take 1 tablet (2.5 mg total) by mouth daily. 06/19/18   Arnetha Courser, MD  atorvastatin (LIPITOR) 80 MG tablet Take 80 mg by mouth at bedtime.     [provider]  budesonide-formoterol (SYMBICORT) 160-4.5 MCG/ACT inhaler Inhale 2 puffs into the lungs 2 (two) times daily.    [provider]  cholecalciferol (VITAMIN D) 1000 units tablet Take 1,000 Units by mouth daily.    [provider]  divalproex (DEPAKOTE ER) 250 MG 24 hr tablet divalproex ER 250 mg tablet,extended release 24 hr    [provider]  doxycycline (VIBRA-TABS) 100 MG tablet Take 1 tablet (100 mg total) by mouth 2 (two) times daily. 07/04/18   Lavonia Drafts, MD  DULoxetine (CYMBALTA) 60 MG capsule Take 120 mg by mouth daily.    [provider]  empagliflozin (JARDIANCE) 25 MG TABS tablet Take 25 mg by  mouth daily.    [provider]  ezetimibe (ZETIA) 10 MG tablet Take 10 mg by mouth daily.  01/05/17   [provider]  fenofibrate micronized (LOFIBRA) 134 MG capsule Take 134 mg by mouth at bedtime.     [provider]  fluticasone (FLONASE) 50 MCG/ACT nasal spray Place 2 sprays into both nostrils daily. 04/16/18   Arnetha Courser, MD  gabapentin (NEURONTIN) 300 MG capsule Take 1 capsule (300 mg total) by mouth 2 (two) times daily. 03/13/18   Edrick Kins, DPM  HUMULIN R U-500 KWIKPEN 500 UNIT/ML kwikpen Inject 60 Units as directed 3 (three) times daily.     [provider]  hydrOXYzine (VISTARIL) 50 MG capsule Take 50-100 mg by mouth daily as needed for anxiety.     [provider]  insulin aspart (NOVOLOG) 100 UNIT/ML injection Inject 5-25 Units into the skin 3 (three) times daily before meals.     [provider]  ketorolac (TORADOL) 10 MG tablet Take 1 tablet (10 mg total) by mouth every 6 (six) hours as needed. 07/04/18   Lavonia Drafts, MD  lamoTRIgine (LAMICTAL) 200 MG tablet Take 200 mg by mouth 2 (two) times daily.     [provider]  levothyroxine (SYNTHROID, LEVOTHROID) 75 MCG tablet Take 1 tablet (75 mcg total) by mouth daily. 10/24/17   Arnetha Courser, MD  linaclotide (LINZESS) 72 MCG capsule Take 1 capsule (72 mcg total) by mouth daily before breakfast. 10/30/17   Lada, Satira Anis, MD  lisinopril (PRINIVIL,ZESTRIL) 2.5 MG tablet Take 1 tablet (2.5 mg total) by mouth daily. 06/19/18   Arnetha Courser, MD  metoprolol tartrate (LOPRESSOR) 25 MG tablet Take one tablet 25 mg in the am and 1/2 tab in the pm 04/03/18   Minna Merritts, MD  ondansetron (ZOFRAN ODT) 4 MG disintegrating tablet Take 1 tablet (4 mg total) by mouth every 8 (eight) hours as needed for nausea or vomiting. 07/04/18   Lavonia Drafts, MD  ondansetron (ZOFRAN) 4 MG tablet Take 1 tablet (4 mg total) by mouth every 8 (eight) hours as needed for nausea or vomiting. 04/16/18   Lada, Satira Anis, MD  pantoprazole (PROTONIX) 40 MG tablet Take 1 tablet (40 mg total) by mouth daily. 10/30/17   Lada, Satira Anis, MD  Prasterone (INTRAROSA) 6.5 MG INST Place 1 suppository vaginally daily. 11/27/17   Zara Council A, PA-C  prochlorperazine (COMPAZINE) 5 MG tablet Take 1 tablet (5 mg total) by mouth every 6 (six) hours as needed for nausea or vomiting. Patient not taking: Reported on 04/16/2018 12/04/17   Eula Listen, MD  QUEtiapine (SEROQUEL) 400 MG tablet Take 400 mg by mouth at bedtime.    [provider]  traMADol (ULTRAM) 50 MG tablet Take 1 tablet (50 mg total) by mouth every 6 (six) hours as needed. 12/31/17 12/31/18  Bjorn Loser, MD  traZODone (DESYREL) 100 MG tablet Take 100 mg by mouth at bedtime.    [provider]  TRULICITY 1.5 JG/2.8ZM SOPN  03/26/18   [provider]    Allergies Mirtazapine; Morphine and related; Sulfamethoxazole-trimethoprim; Amoxicillin er; Codeine; Hydrocodone-acetaminophen; Keflex [cephalexin]; and Prasterone  Family History  Problem Relation Age of Onset  . Diabetes Mother   .  Heart disease Mother   . Hyperlipidemia Mother   . Hypertension Mother   . Kidney disease Mother   . Mental illness Mother   . Hypothyroidism Mother   . Stroke Mother   .  Osteoporosis Mother   . Glaucoma Mother   . Congestive Heart Failure Mother   . Hypertension Father   . Asthma Daughter   . Cancer Daughter        throat  . Bipolar disorder Daughter   . Heart disease Maternal Grandfather   . Rheum arthritis Maternal Grandfather   . Cancer Maternal Grandfather        liver  . Kidney disease Maternal Grandfather   . Cancer Paternal Grandmother        lung  . Kidney disease Brother   . Breast cancer Maternal Grandmother 60  . Bipolar disorder Daughter   . Hypertension Daughter   . Restless legs syndrome Daughter     Social History Social History   Tobacco Use  . Smoking status: Never Smoker  . Smokeless tobacco: Never Used  Substance Use Topics  . Alcohol use: No  . Drug use: No    Review of Systems  Constitutional: No fever/chills Eyes: No visual changes. ENT: No sore throat. Cardiovascular: Denies chest pain. Respiratory: As above  gastrointestinal: No abdominal pain.  No nausea, no vomiting.  No diarrhea.  No constipation. Genitourinary: Negative for dysuria. Musculoskeletal: Negative for back pain. Skin: As above Neurological: Negative for headaches, focal weakness or numbness.   ____________________________________________   PHYSICAL EXAM:  VITAL SIGNS: ED Triage Vitals [07/06/18 0901]  Enc Vitals Group     BP      Pulse Rate 64     Resp 15     Temp      Temp src      SpO2 95 %     Weight      Height      Head Circumference      Peak Flow      Pain Score      Pain Loc      Pain Edu?      Excl. in [place]?     Constitutional: Alert and oriented. Well appearing and in no acute distress. Eyes: Conjunctivae are normal.  Mild edema under the bilateral eyes.  It is not erythematous or indurated.  No exudate. Head: Atraumatic. Nose: No  congestion/rhinnorhea. Mouth/Throat: Mucous membranes are moist.  Neck: No stridor.   Cardiovascular: Normal rate, regular rhythm. Grossly normal heart sounds.   Respiratory: Normal respiratory effort.  No retractions. Lungs CTAB. Gastrointestinal: Soft and nontender. No distention.  Digital rectal exam which is heme-negative with brown stool. Musculoskeletal: No lower extremity tenderness nor edema.  No joint effusions. Neurologic:  Normal speech and language. No gross focal neurologic deficits are appreciated. Skin: 3 cm, circular, raised erythematous lesion which is very tender to palpation.  Comes to ahead.  No active drainage at this time.  4 cm, indurated, erythematous and tender area to the midline upper thoracic spine.  Tenderness is minimal.  No fluctuance.  Scabbed over in the center of the lesion.  Psychiatric: Mood and affect are normal. Speech and behavior are normal.  ____________________________________________   LABS (all labs ordered are listed, but only abnormal results are displayed)  Labs Reviewed  CBC WITH DIFFERENTIAL/PLATELET - Abnormal; Notable for the following components:      Result Value   Hemoglobin 10.9 (*)    HCT 35.0 (*)    All other components within normal limits  BASIC METABOLIC PANEL - Abnormal; Notable for the following components:   Glucose, Bld 253 (*)    BUN 27 (*)    Creatinine, Ser 1.27 (*)  GFR calc non Af Amer 47 (*)    GFR calc Af Amer 54 (*)    All other components within normal limits  TROPONIN I  BRAIN NATRIURETIC PEPTIDE   ____________________________________________  EKG  ED ECG REPORT I, Doran Stabler, the attending physician, personally viewed and interpreted this ECG.   Date: 07/06/2018  EKG Time: 0918  Rate: 76  Rhythm: normal sinus rhythm  Axis: Normal  Intervals:none  ST&T Change: No ST segment elevation or depression.  No abnormal T wave  inversion.  ____________________________________________  RADIOLOGY  Chest x-ray without acute process. ____________________________________________   PROCEDURES  Procedure(s) performed:   Marland KitchenMarland KitchenIncision and Drainage Date/Time: 07/06/2018 10:31 AM Performed by: Orbie Pyo, MD Authorized by: Orbie Pyo, MD   Consent:    Consent obtained:  Verbal   Consent given by:  Patient   Risks discussed:  Bleeding, infection, incomplete drainage and pain   Alternatives discussed:  Alternative treatment, delayed treatment and observation Location:    Type:  Abscess   Location:  Head   Head location:  Scalp Pre-procedure details:    Skin preparation:  Antiseptic wash Anesthesia (see MAR for exact dosages):    Anesthesia method:  Local infiltration   Local anesthetic:  Lidocaine 1% WITH epi Procedure type:    Complexity:  Complex Procedure details:    Incision types:  Single straight   Incision depth:  Dermal   Scalpel blade:  11   Wound management:  Probed and deloculated   Drainage:  Purulent and bloody   Drainage amount:  Moderate   Wound treatment:  Wound left open   Packing materials:  1/4 in gauze   Amount 1/4":  1in Post-procedure details:    Patient tolerance of procedure:  Tolerated well, no immediate complications    Critical Care performed:   ____________________________________________   INITIAL IMPRESSION / ASSESSMENT AND PLAN / ED COURSE  Pertinent labs & imaging results that were available during my care of the patient were reviewed by me and considered in my medical decision making (see chart for details).  Differential includes, but is not limited to, viral syndrome, bronchitis including COPD exacerbation, pneumonia, reactive airway disease including asthma, CHF including exacerbation with or without pulmonary/interstitial edema, pneumothorax, ACS, thoracic trauma, and pulmonary embolism. As part of my medical decision making, I  reviewed the following data within the electronic MEDICAL RECORD NUMBER Notes from prior ED visits  ----------------------------------------- 10:31 AM on 07/06/2018 -----------------------------------------  Patient will remain on doxycycline.  I do not believe the lesion on the back needs to be drained at this time as there is no fluctuance and only minimal tenderness to palpation.  Likely cellulitic without pus collection.  Patient understand that she will need to have the gauze removed in 2 days.  Will be following with her primary care doctor.  At 95% on room air.  With shortness of breath is resolved per the patient.  Will be discharged home at this time.  We also discussed that the edema to the eyes likely related to the multiple infections she has had recently above the eye is likely causing fluid shifts collection closer to the ground.  She is understand the diagnosis as well as treatment plan willing to comply. ____________________________________________   FINAL CLINICAL IMPRESSION(S) / ED DIAGNOSES  Shortness of breath.  Abscess.  NEW MEDICATIONS STARTED DURING THIS VISIT:  New Prescriptions   No medications on file     Note:  This document was prepared using  [employer] forces training and education officer and may include unintentional dictation errors.     Orbie Pyo, MD 07/06/18 [phone]

## 2018-07-08 IMAGING — CR DG LUMBAR SPINE 2-3V
1 series · 3 of 3 positions shown · non-contrast
Comparison: None.

CLINICAL DATA: Fell yesterday.  Back pain.

EXAM:
LUMBAR SPINE - 2-3 VIEW; THORACIC SPINE 2 VIEWS

[Series 1: dg lumbar spine 2-3 views · 0.14mm/px · 3 of 3 slices shown]
[im 1/3]
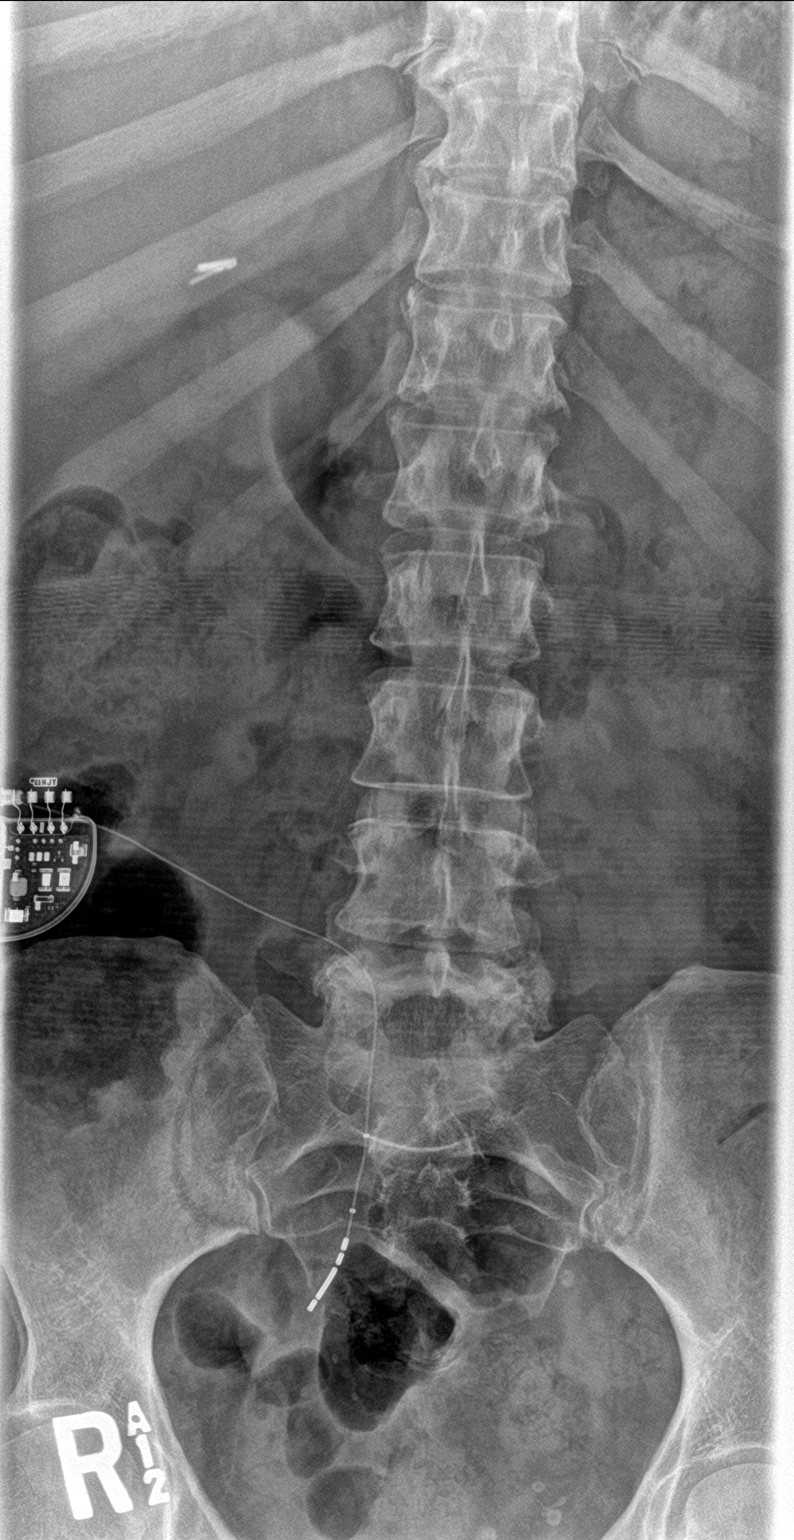
[im 2/3]
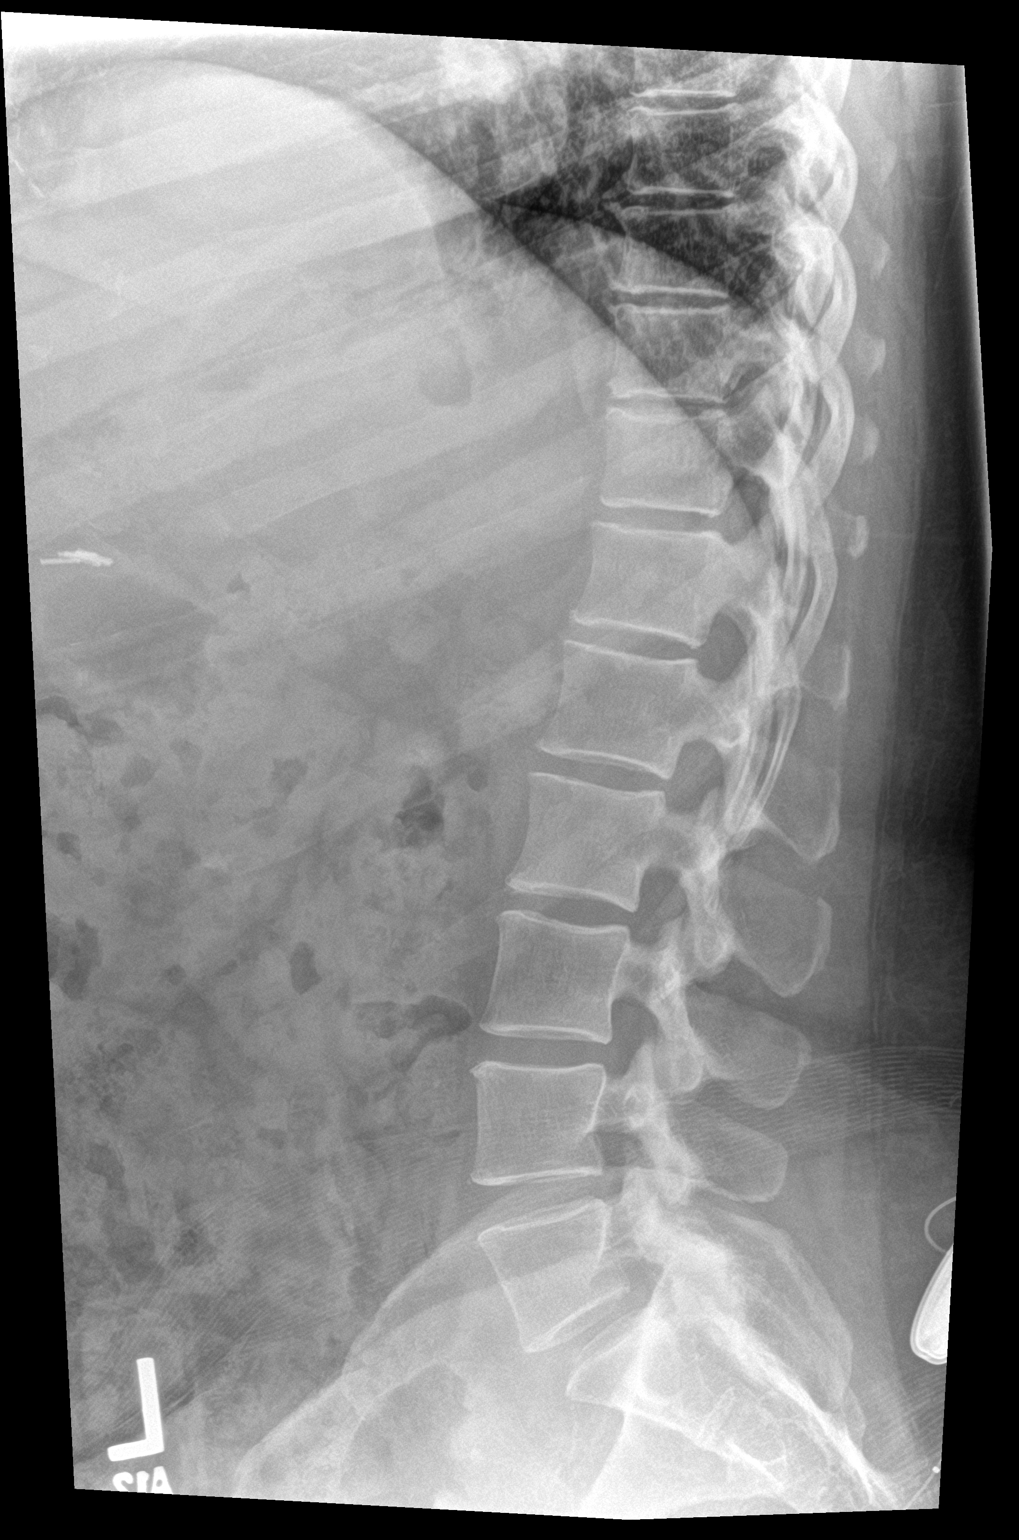
[im 3/3]
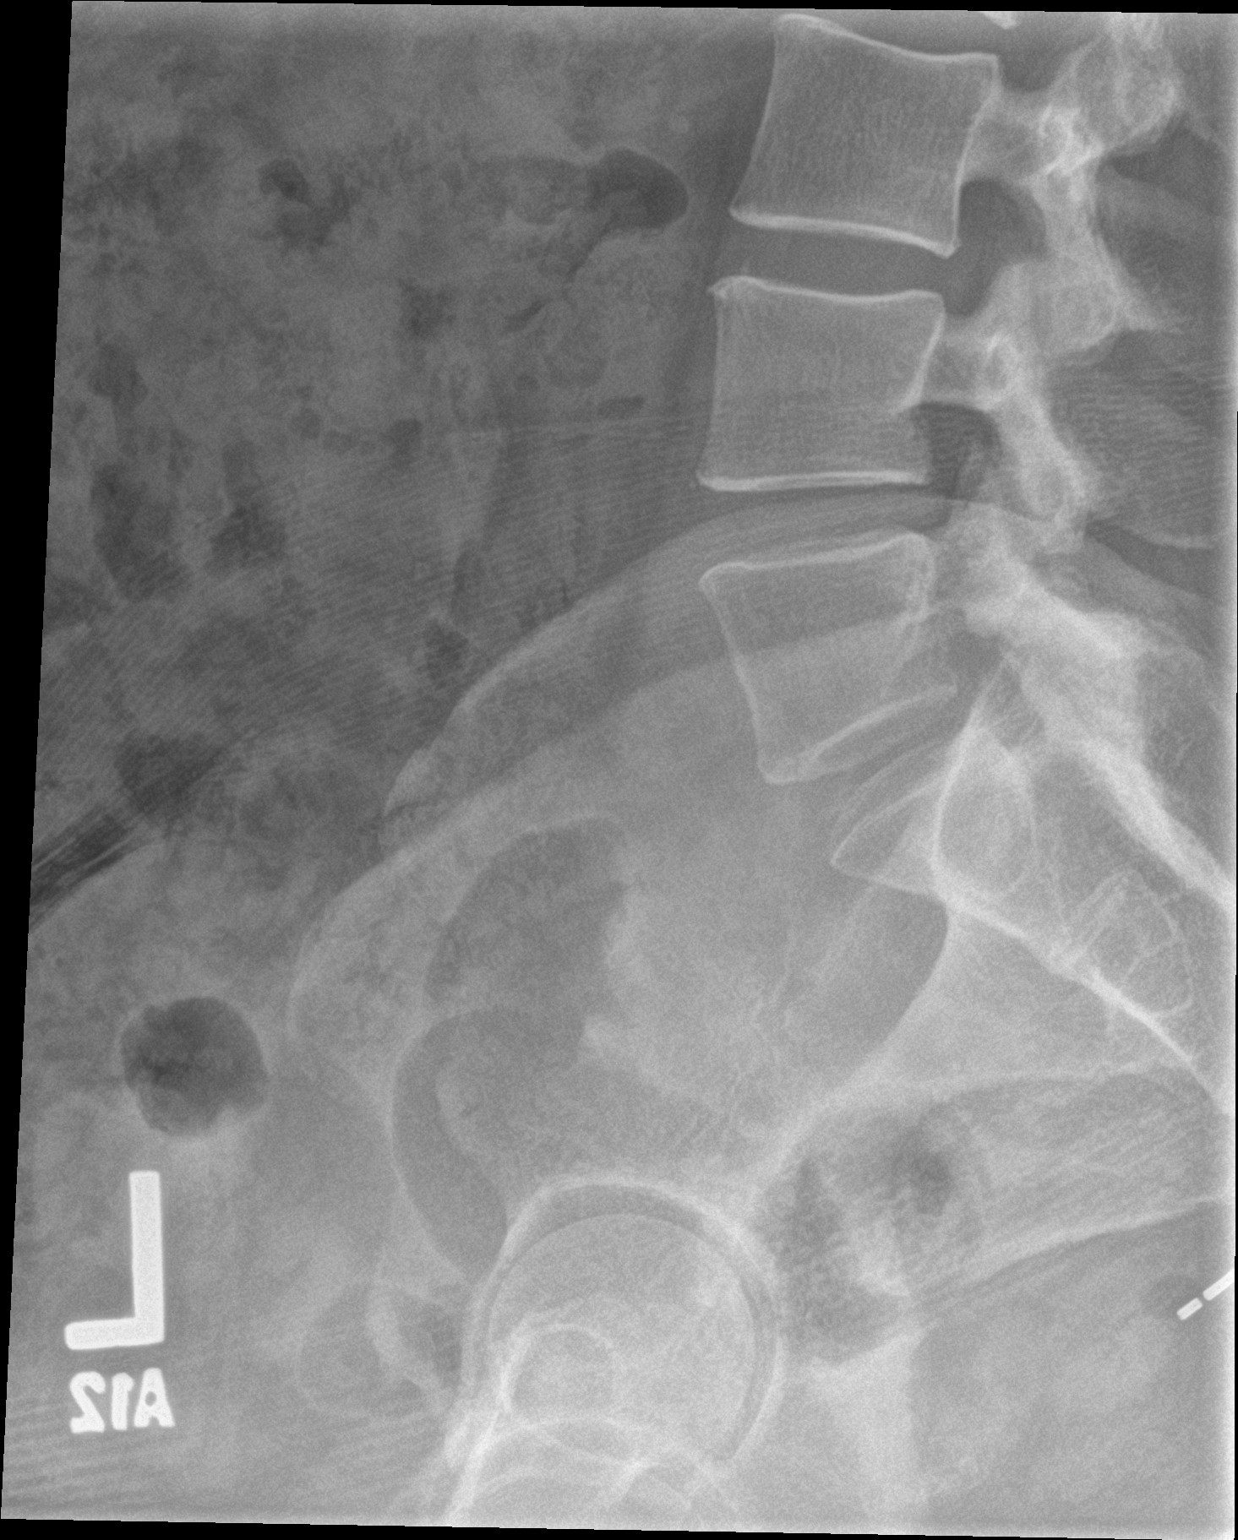

[3 of 3 positions shown; findings below may reference images not displayed]

FINDINGS: Thoracic spine:

Normal alignment of the thoracic vertebral bodies. Disc spaces and
vertebral bodies are maintained. Mild degenerative changes. No acute
compression fracture. No abnormal paraspinal soft tissue thickening.
The visualized posterior ribs are intact.

Lumbar spine:

Normal alignment of the lumbar vertebral bodies. Disc spaces and
vertebral bodies are maintained. The facets are normally aligned. No
pars defects. The visualized bony pelvis is intact.
IMPRESSION: Normal alignment and no acute bony findings.

## 2018-07-08 IMAGING — CR DG THORACIC SPINE 2V
1 series · 2 of 2 positions shown · non-contrast
Comparison: None.

CLINICAL DATA: Fell yesterday.  Back pain.

EXAM:
LUMBAR SPINE - 2-3 VIEW; THORACIC SPINE 2 VIEWS

[Series 1: dg thoracic spine 2 view · 0.14mm/px · 2 of 2 slices shown]
[im 1/2]
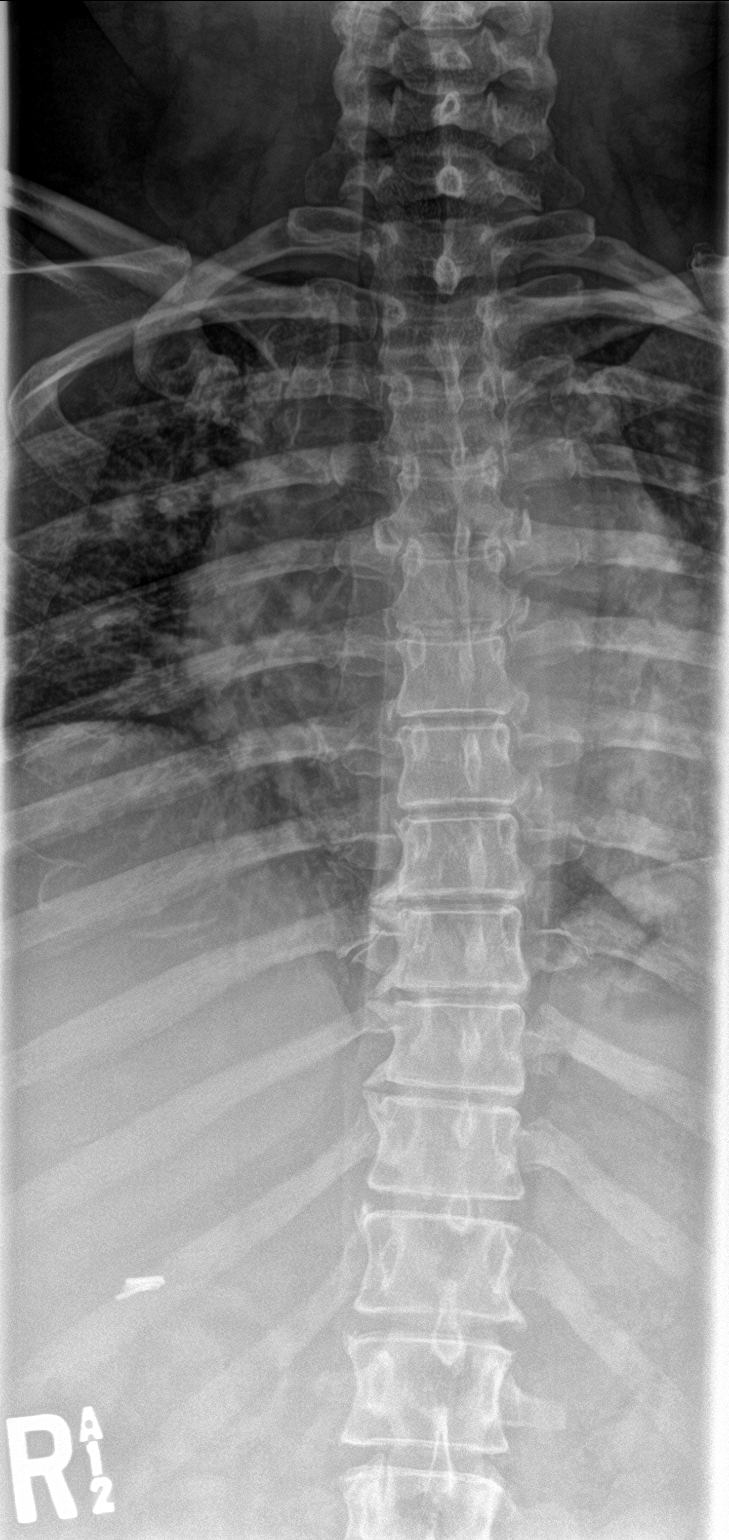
[im 2/2]
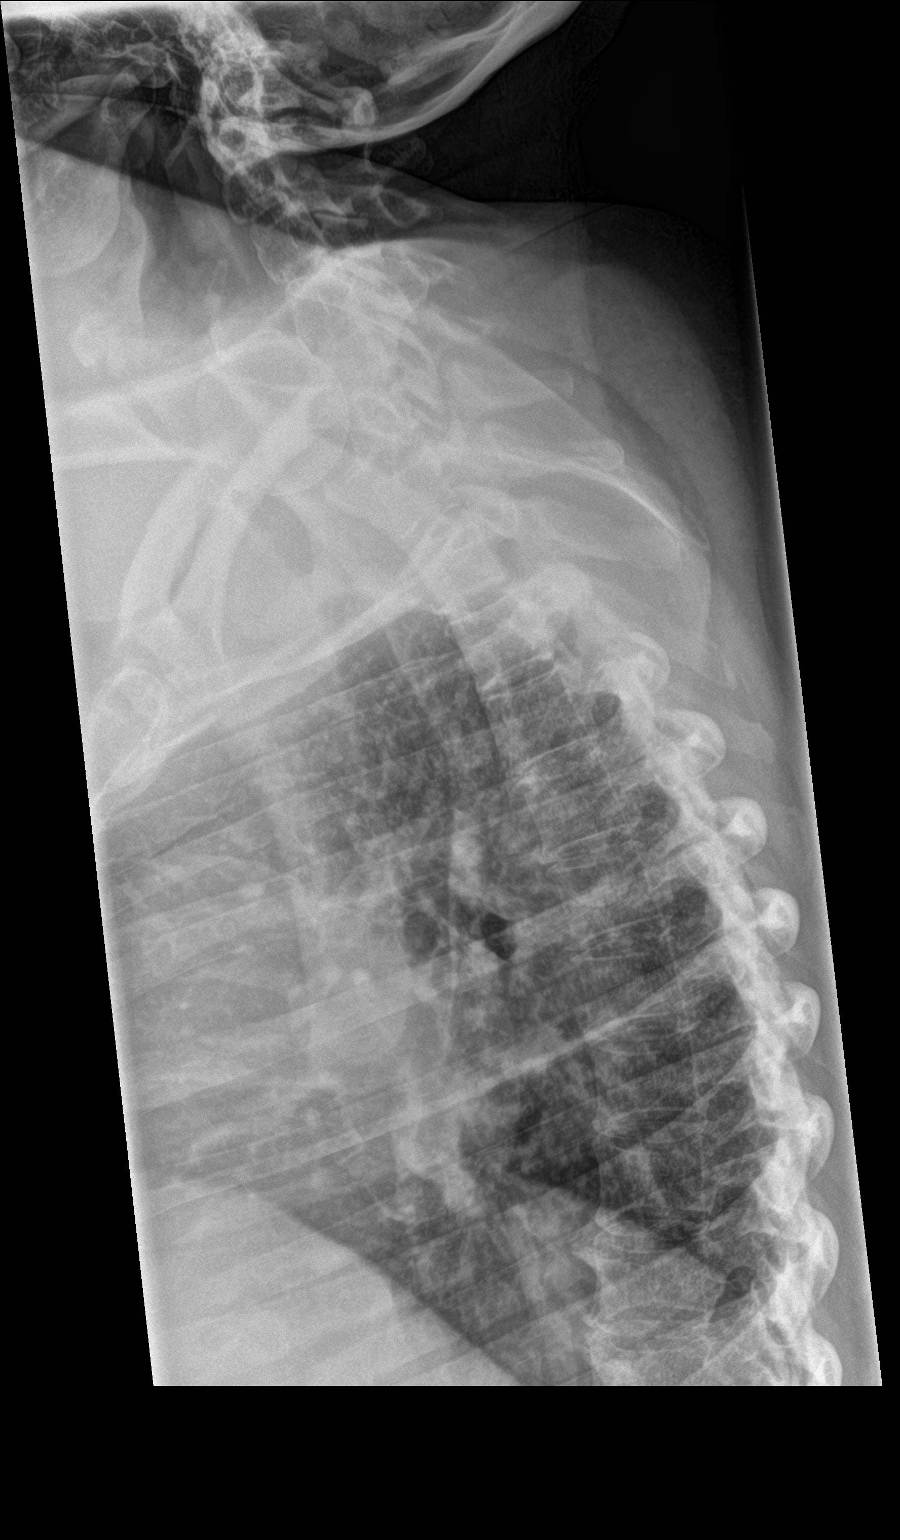

[2 of 2 positions shown; findings below may reference images not displayed]

FINDINGS: Thoracic spine:

Normal alignment of the thoracic vertebral bodies. Disc spaces and
vertebral bodies are maintained. Mild degenerative changes. No acute
compression fracture. No abnormal paraspinal soft tissue thickening.
The visualized posterior ribs are intact.

Lumbar spine:

Normal alignment of the lumbar vertebral bodies. Disc spaces and
vertebral bodies are maintained. The facets are normally aligned. No
pars defects. The visualized bony pelvis is intact.
IMPRESSION: Normal alignment and no acute bony findings.

## 2018-07-18 ENCOUNTER — Encounter: Payer: Self-pay | Admitting: Family Medicine

## 2018-07-18 ENCOUNTER — Ambulatory Visit (INDEPENDENT_AMBULATORY_CARE_PROVIDER_SITE_OTHER): Payer: Medicare HMO | Admitting: Family Medicine

## 2018-07-18 VITALS — BP 128/80 | HR 99 | Temp 99.0°F | Ht 59.0 in | Wt 184.7 lb

## 2018-07-18 DIAGNOSIS — Z23 Encounter for immunization: Secondary | ICD-10-CM

## 2018-07-18 DIAGNOSIS — E1151 Type 2 diabetes mellitus with diabetic peripheral angiopathy without gangrene: Secondary | ICD-10-CM

## 2018-07-18 DIAGNOSIS — D649 Anemia, unspecified: Secondary | ICD-10-CM | POA: Diagnosis not present

## 2018-07-18 DIAGNOSIS — G8929 Other chronic pain: Secondary | ICD-10-CM

## 2018-07-18 DIAGNOSIS — I999 Unspecified disorder of circulatory system: Secondary | ICD-10-CM

## 2018-07-18 DIAGNOSIS — R1084 Generalized abdominal pain: Secondary | ICD-10-CM

## 2018-07-18 DIAGNOSIS — K769 Liver disease, unspecified: Secondary | ICD-10-CM

## 2018-07-18 DIAGNOSIS — K7581 Nonalcoholic steatohepatitis (NASH): Secondary | ICD-10-CM | POA: Diagnosis not present

## 2018-07-18 DIAGNOSIS — K5900 Constipation, unspecified: Secondary | ICD-10-CM

## 2018-07-18 NOTE — Progress Notes (Signed)
BP 128/80   Pulse 99   Temp 99 F (37.2 C) (Oral)   Ht 4' 11"  (1.499 m)   Wt 184 lb 11.2 oz (83.8 kg)   SpO2 95%   BMI 37.30 kg/m    Subjective:    Patient ID: [name], female    DOB: [birth year], 52 y.o.   MRN: [id number]  HPI: [name] is a 52 y.o. female  Chief Complaint  Patient presents with  . Follow-up    HPI Patient is here for f/u Was in the ER and had abscess drained, infection on the scalp in the front and had to drain it all out; finished doxy; no new diarrhea Sugars are in the 200s; losing weight on purose; watching portions and not eating as much deep fried, not eating late at night Seeing Dr. Ronnald Collum, every 3 months Gabapentin still helping Decreasing hemoglobin No blood in the stool per ER doctor; he did DRE; no blood in the urine; no nosebleeds; no gum bleeding Hgb reviewed, down to 10.9 on Nov 2nd; no evidence of blood loss No recent colonoscopies He will be going to see Dr. Allen Norris; stomach has been acting up; pain and constipation in the abdomen pointing around the umbilicus and both sides; already knows about liver disease, NASH Brother and mother were on dialysis, as well as MGF on dialysis She has seen nephrologist, Dr. Holley Raring; he knows about that history   Depression screen [facility] 2/9 07/18/2018 04/16/2018 11/28/2017 10/30/2017 10/09/2017  Decreased Interest 0 0 0 0 0  Down, Depressed, Hopeless 0 0 1 0 0  PHQ - 2 Score 0 0 1 0 0  Altered sleeping 0 - - - -  Tired, decreased energy 0 - - - -  Change in appetite 0 - - - -  Feeling bad or failure about yourself  0 - - - -  Trouble concentrating 0 - - - -  Moving slowly or fidgety/restless 0 - - - -  Suicidal thoughts 0 - - - -  PHQ-9 Score 0 - - - -  Difficult doing work/chores Not difficult at all - - - -  Some recent data might be hidden   Fall Risk  07/22/2018 07/18/2018 04/16/2018 11/28/2017 10/30/2017  Falls in the past year? 0 1 Yes Yes Yes  Comment - - - - -  Number falls in past yr: 0  1 1 2  or more 2 or more  Comment - - - - -  Injury with Fall? - 0 No Yes No  Comment - - - - -  Risk for fall due to : - Impaired balance/gait;Medication side effect - - -  Follow up - - - - -    Relevant past medical, surgical, family and social history reviewed Past Medical History:  Diagnosis Date  . Anxiety   . Arthritis   . Asthma   . Bipolar depression ([place])   . CHF (congestive heart failure) ([place])   . Chronic headaches    migraines have lessened  . Chronic kidney disease    ddstage 111  . COPD (chronic obstructive pulmonary disease) ([place])   . Coronary artery disease   . Depression   . Diabetic neuropathy ([place])   . Dyspnea    uses inhalers for this  . Dysrhythmia    rapid  . Fatty liver   . GERD (gastroesophageal reflux disease)   . Headache   . Heart attack ([place]) 2009  . Hyperlipidemia   .  Hypertension   . Hypomagnesemia   . Hypothyroid   . Irregular heart rhythm   . Liver lesion    Favored to be benign on CT; MRI of abd w/contract in Aug 2016. Liver Biopsy-Negative, March 2016  . Morbid obesity ([place])   . OSA (obstructive sleep apnea)   . Peripheral vascular disease ([place])   . Pinched nerve    Back  . Pneumonia   . RLS (restless legs syndrome)   . Seasonal allergies   . Sleep apnea    uses oxygen at night 2L d/t cpap did not fit  . Type 2 diabetes, uncontrolled, with neuropathy ([place]) 10/15/2015   Overview:  Microalbumin 7.7 12/2010: Hgb A1c 10.2% 12/2010 and 9.7% 03/2011, eye exam 10/2009 [facility] DM clinic   . Urinary incontinence   . Vitamin D deficiency    Past Surgical History:  Procedure Laterality Date  . ABDOMINAL HYSTERECTOMY    . ABDOMINAL HYSTERECTOMY     Partial  . bladder stim  2007  . BLADDER SURGERY     x 2. tacking of bladder and the other was the stimulator placement  . CHOLECYSTECTOMY    . INTERSTIM-GENERATOR CHANGE N/A 12/31/2017   Procedure: INTERSTIM-GENERATOR CHANGE;  Surgeon: Bjorn Loser, MD;  Location: [facility] ORS;  Service:  Urology;  Laterality: N/A;  . LIVER BIOPSY  March 2016   Negative  . LUNG BIOPSY  2015   small spot on lung  . TUBAL LIGATION     Family History  Problem Relation Age of Onset  . Diabetes Mother   . Heart disease Mother   . Hyperlipidemia Mother   . Hypertension Mother   . Kidney disease Mother   . Mental illness Mother   . Hypothyroidism Mother   . Stroke Mother   . Osteoporosis Mother   . Glaucoma Mother   . Congestive Heart Failure Mother   . Hypertension Father   . Asthma Daughter   . Cancer Daughter        throat  . Bipolar disorder Daughter   . Heart disease Maternal Grandfather   . Rheum arthritis Maternal Grandfather   . Cancer Maternal Grandfather        liver  . Kidney disease Maternal Grandfather   . Cancer Paternal Grandmother        lung  . Kidney disease Brother   . Breast cancer Maternal Grandmother 60  . Bipolar disorder Daughter   . Hypertension Daughter   . Restless legs syndrome Daughter    Social History   Tobacco Use  . Smoking status: Never Smoker  . Smokeless tobacco: Never Used  Substance Use Topics  . Alcohol use: No  . Drug use: No     Office Visit from 07/18/2018 in [facility]  AUDIT-C Score  0      Interim medical history since last visit reviewed. Allergies and medications reviewed  Review of Systems Per HPI unless specifically indicated above     Objective:    BP 128/80   Pulse 99   Temp 99 F (37.2 C) (Oral)   Ht 4' 11"  (1.499 m)   Wt 184 lb 11.2 oz (83.8 kg)   SpO2 95%   BMI 37.30 kg/m   Wt Readings from Last 3 Encounters:  07/22/18 181 lb 12.8 oz (82.5 kg)  07/21/18 181 lb (82.1 kg)  07/18/18 184 lb 11.2 oz (83.8 kg)    Physical Exam  Constitutional: She appears well-developed and well-nourished. No distress.  HENT:  Head: Normocephalic and atraumatic.  Eyes: EOM are normal. No scleral icterus.  Neck: No thyromegaly present.  Cardiovascular: Normal rate, regular rhythm and normal  heart sounds.  No murmur heard. Pulmonary/Chest: Effort normal and breath sounds normal. No respiratory distress. She has no wheezes.  Abdominal: Soft. Bowel sounds are normal. She exhibits no distension.  Musculoskeletal: She exhibits no edema.  Neurological: She is alert.  Skin: Skin is warm and dry. She is not diaphoretic. No pallor.  Psychiatric: She has a normal mood and affect. Her behavior is normal. Judgment and thought content normal.   Diabetic Foot Form - Detailed   Diabetic Foot Exam - detailed Diabetic Foot exam was performed with the following findings:  Yes 07/18/2018 11:33 AM  Visual Foot Exam completed.:  Yes  Pulse Foot Exam completed.:  Yes  Right Dorsalis Pedis:  Diminished Left Dorsalis Pedis:  Diminished  Semmes-Weinstein Monofilament Test   Comments:  Right foot a little cooler and little more purplish than the left        Assessment & Plan:   Problem List Items Addressed This Visit      Digestive   Liver disease   Relevant Orders   Ambulatory referral to Gastroenterology   COMPLETE METABOLIC PANEL WITH GFR (Completed)   NASH (nonalcoholic steatohepatitis)   Relevant Orders   Ambulatory referral to Gastroenterology   COMPLETE METABOLIC PANEL WITH GFR (Completed)     Other   Liver lesion   Relevant Orders   Ambulatory referral to Gastroenterology   CN (constipation)   Relevant Orders   Ambulatory referral to Gastroenterology    Other Visit Diagnoses    Anemia, unspecified type    -  Primary   Relevant Orders   CBC with Differential/Platelet (Completed)   Iron, TIBC and Ferritin Panel (Completed)   Vitamin B12 (Completed)   Folate (Completed)   Type 2 diabetes with decreased circulation (HCC)       Relevant Orders   US ARTERIAL LOWER EXTREMITY DUPLEX BILATERAL   Decreased circulation       Relevant Orders   US ARTERIAL LOWER EXTREMITY DUPLEX BILATERAL   Abdominal pain, chronic, generalized       Relevant Orders   Ambulatory referral to  Gastroenterology   CBC with Differential/Platelet (Completed)   COMPLETE METABOLIC PANEL WITH GFR (Completed)   Need for influenza vaccination       Relevant Orders   Flu Vaccine QUAD 6+ mos PF IM (Fluarix Quad PF) (Completed)       Follow up plan: Return in about 4 weeks (around 08/15/2018).  An after-visit summary was printed and given to the patient at [place].  Please see the patient instructions which may contain other information and recommendations beyond what is mentioned above in the assessment and plan.  No orders of the defined types were placed in this encounter.   Orders Placed This Encounter  Procedures  . US ARTERIAL LOWER EXTREMITY DUPLEX BILATERAL  . Flu Vaccine QUAD 6+ mos PF IM (Fluarix Quad PF)  . CBC with Differential/Platelet  . Iron, TIBC and Ferritin Panel  . Vitamin B12  . Folate  . COMPLETE METABOLIC PANEL WITH GFR  . Ambulatory referral to Gastroenterology

## 2018-07-18 NOTE — Patient Instructions (Signed)
Let's get labs today We'll have you see Dr. Allen Norris Please call if needed

## 2018-07-19 LAB — COMPLETE METABOLIC PANEL WITH GFR
AG Ratio: 2.1 (calc) (ref 1.0–2.5)
ALBUMIN MSPROF: 4.2 g/dL (ref 3.6–5.1)
ALT: 30 U/L — ABNORMAL HIGH (ref 6–29)
AST: 29 U/L (ref 10–35)
Alkaline phosphatase (APISO): 46 U/L (ref 33–130)
BUN / CREAT RATIO: 33 (calc) — AB (ref 6–22)
BUN: 40 mg/dL — AB (ref 7–25)
CALCIUM: 10.2 mg/dL (ref 8.6–10.4)
CO2: 27 mmol/L (ref 20–32)
CREATININE: 1.2 mg/dL — AB (ref 0.50–1.05)
Chloride: 101 mmol/L (ref 98–110)
GFR, Est African American: 59 mL/min/{1.73_m2} — ABNORMAL LOW (ref >=60)
GFR, Est Non African American: 51 mL/min/{1.73_m2} — ABNORMAL LOW (ref >=60)
GLOBULIN: 2 g/dL (ref 1.9–3.7)
GLUCOSE: 160 mg/dL — AB (ref 65–99)
Potassium: 4.2 mmol/L (ref 3.5–5.3)
SODIUM: 138 mmol/L (ref 135–146)
Total Bilirubin: 0.4 mg/dL (ref 0.2–1.2)
Total Protein: 6.2 g/dL (ref 6.1–8.1)

## 2018-07-19 LAB — CBC WITH DIFFERENTIAL/PLATELET
BASOS PCT: 0.7 %
Basophils Absolute: 76 cells/uL (ref 0–200)
EOS ABS: 283 {cells}/uL (ref 15–500)
Eosinophils Relative: 2.6 %
HEMATOCRIT: 38.8 % (ref 35.0–45.0)
HEMOGLOBIN: 12.7 g/dL (ref 11.7–15.5)
Lymphs Abs: 3968 cells/uL — ABNORMAL HIGH (ref 850–3900)
MCH: 27.5 pg (ref 27.0–33.0)
MCHC: 32.7 g/dL (ref 32.0–36.0)
MCV: 84.2 fL (ref 80.0–100.0)
MPV: 10.2 fL (ref 7.5–12.5)
Monocytes Relative: 5.9 %
Neutro Abs: 5930 cells/uL (ref 1500–7800)
Neutrophils Relative %: 54.4 %
Platelets: 323 10*3/uL (ref 140–400)
RBC: 4.61 10*6/uL (ref 3.80–5.10)
RDW: 13.9 % (ref 11.0–15.0)
TOTAL LYMPHOCYTE: 36.4 %
WBC: 10.9 10*3/uL — ABNORMAL HIGH (ref 3.8–10.8)
WBCMIX: 643 {cells}/uL (ref 200–950)

## 2018-07-19 LAB — IRON,TIBC AND FERRITIN PANEL
%SAT: 21 % (calc) (ref 16–45)
Ferritin: 140 ng/mL (ref 16–232)
IRON: 89 ug/dL (ref 45–160)
TIBC: 432 mcg/dL (calc) (ref 250–450)

## 2018-07-19 LAB — VITAMIN B12: Vitamin B-12: 357 pg/mL (ref 200–1100)

## 2018-07-19 LAB — FOLATE: Folate: 5.5 ng/mL

## 2018-07-21 ENCOUNTER — Emergency Department
Admission: EM | Admit: 2018-07-21 | Discharge: 2018-07-21 | Disposition: A | Discharge status: Home / Self Care | Payer: Medicare HMO | Attending: Emergency Medicine | Admitting: Emergency Medicine

## 2018-07-21 ENCOUNTER — Other Ambulatory Visit: Payer: Self-pay

## 2018-07-21 DIAGNOSIS — Z5321 Procedure and treatment not carried out due to patient leaving prior to being seen by health care provider: Secondary | ICD-10-CM | POA: Diagnosis not present

## 2018-07-21 DIAGNOSIS — K0889 Other specified disorders of teeth and supporting structures: Secondary | ICD-10-CM | POA: Diagnosis not present

## 2018-07-21 NOTE — ED Triage Notes (Signed)
Pt c/o right upper toothache with noted facial swelling for the past couple of days

## 2018-07-21 NOTE — ED Notes (Signed)
Pt ambulatory to desk with reports that she no longer wishes to be seen and wants to call her PCP in the am. Pt in no acute distress at this time.

## 2018-07-22 ENCOUNTER — Ambulatory Visit (INDEPENDENT_AMBULATORY_CARE_PROVIDER_SITE_OTHER): Payer: Medicare HMO | Admitting: Nurse Practitioner

## 2018-07-22 ENCOUNTER — Encounter: Payer: Self-pay | Admitting: Nurse Practitioner

## 2018-07-22 VITALS — BP 106/70 | HR 100 | Temp 98.1°F | Resp 16 | Ht 59.0 in | Wt 181.8 lb

## 2018-07-22 DIAGNOSIS — H04203 Unspecified epiphora, bilateral lacrimal glands: Secondary | ICD-10-CM | POA: Diagnosis not present

## 2018-07-22 DIAGNOSIS — R220 Localized swelling, mass and lump, head: Secondary | ICD-10-CM | POA: Diagnosis not present

## 2018-07-22 DIAGNOSIS — K047 Periapical abscess without sinus: Secondary | ICD-10-CM

## 2018-07-22 MED ORDER — CLINDAMYCIN HCL 300 MG PO CAPS: 300.0000 mg | ORAL_CAPSULE | Freq: Three times a day (TID) | ORAL | 0 refills | Status: AC

## 2018-07-22 MED ORDER — LORATADINE 10 MG PO TABS: 10.0000 mg | ORAL_TABLET | Freq: Every day | ORAL | 0 refills | Status: DC

## 2018-07-22 NOTE — Progress Notes (Signed)
Name: [name]   MRN: [id number]    DOB: Jun 17, 1966   Date:07/22/2018       Progress Note  Subjective  Chief Complaint  Chief Complaint  Patient presents with  . Facial Swelling    has swelling and redness underneath her right eye. She reports that it was from the infected cyst that she had on her scalp from 07/04/2018. She states she had swelling and redness on the right side of her face but it has gradually improved.    HPI  Patient endorses right upper dental swelling and pain, states yesterday was so swollen had problems opening mouth. Also noted lower right under eye swelling- states has significantly improved today on its own. Endorses area was itchy as well.   She had forehead abscess drained 2 weeks ago and completed course of doxycycline  No fevers, chills, animal bites/scratches, red eyes, eye drainage, blurry vision, eye pain.    Patient Active Problem List   Diagnosis Date Noted  . Encounter for Medicare annual wellness exam 10/30/2017  . Patient is full code 10/30/2017  . Disorder of bursae of shoulder region 10/09/2017  . Coronary artery disease 10/09/2017  . Plantar fascial fibromatosis 10/09/2017  . Tendinitis of wrist 10/09/2017  . Schizoaffective disorder, bipolar type ([place]) 09/28/2017  . Chest pain 09/10/2017  . Lobar pneumonia ([place]) 06/05/2017  . Multifocal pneumonia 05/27/2017  . Pleuritic chest pain 05/27/2017  . Hypertriglyceridemia 12/21/2016  . Pansinusitis 11/15/2016  . 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 Breast cancer screening 09/28/2016  . Urine frequency 09/28/2016  . Preventative health care 09/28/2016  . Fatigue 05/01/2016  . Urinary tract infection 03/14/2016  . Adenomatous colon polyp 10/15/2015  . Airway hyperreactivity 10/15/2015  . Bipolar affective disorder ([place]) 10/15/2015  . CN (constipation) 10/15/2015  . CAFL (chronic airflow limitation) ([place]) 10/15/2015  . Drug-induced hepatic toxicity 10/15/2015    . Blood in feces 10/15/2015  . HLD (hyperlipidemia) 10/15/2015  . Hypoxemia 10/15/2015  . NASH (nonalcoholic steatohepatitis) 10/15/2015  . Morbid obesity ([place]) 10/15/2015  . Restless leg syndrome 10/15/2015  . Type 2 diabetes, uncontrolled, with neuropathy ([place]) 10/15/2015  . Elevated alkaline phosphatase level 09/22/2015  . Urinary bladder incontinence 09/22/2015  . Low back pain with left-sided sciatica 08/18/2015  . Encounter for monitoring tricyclic antidepressant therapy 05/13/2015  . Bipolar disorder ([place]) 05/11/2015  . Liver disease 05/11/2015  . Apnea, sleep 05/05/2015  . Heart valve disease 05/05/2015  . Chronic kidney disease, stage III (moderate) (HCC) 03/15/2015  . Migraine 03/12/2015  . COPD (chronic obstructive pulmonary disease) ([place]) 01/20/2015  . 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 GERD (gastroesophageal reflux disease) 01/20/2015  . Hypothyroid   . Vitamin D deficiency   . Urinary incontinence   . Liver lesion   . Hypomagnesemia   . Obstructive sleep apnea 10/20/2013  . Vaginitis 12/19/2011  . PERIPHERAL NEUROPATHY 03/04/2009  . HEMORRHOIDS, EXTERNAL 09/03/2008  . ALLERGIC RHINITIS 07/04/2007  . ASTHMA, PERSISTENT, MODERATE 07/04/2007  . INTERSTITIAL CYSTITIS 07/04/2007  . HYPERTENSION, BENIGN ESSENTIAL 06/19/2007    Past Medical History:  Diagnosis Date  . Anxiety   . Arthritis   . Asthma   . Bipolar depression ([place])   . CHF (congestive heart failure) ([place])   . Chronic headaches    migraines have lessened  . Chronic kidney disease    ddstage 111  . COPD (chronic obstructive pulmonary disease) ([place])   . Coronary artery disease   .  Depression   . Diabetic neuropathy ([place])   . Dyspnea    uses inhalers for this  . Dysrhythmia    rapid  . Fatty liver   . GERD (gastroesophageal reflux disease)   . Headache   . Heart attack ([place]) 2009  . Hyperlipidemia   . Hypertension   . Hypomagnesemia   . Hypothyroid   . Irregular heart  rhythm   . Liver lesion    Favored to be benign on CT; MRI of abd w/contract in Aug 2016. Liver Biopsy-Negative, March 2016  . Morbid obesity ([place])   . OSA (obstructive sleep apnea)   . Peripheral vascular disease ([place])   . Pinched nerve    Back  . Pneumonia   . RLS (restless legs syndrome)   . Seasonal allergies   . Sleep apnea    uses oxygen at night 2L d/t cpap did not fit  . Type 2 diabetes, uncontrolled, with neuropathy ([place]) 10/15/2015   Overview:  Microalbumin 7.7 12/2010: Hgb A1c 10.2% 12/2010 and 9.7% 03/2011, eye exam 10/2009 [facility] DM clinic   . Urinary incontinence   . Vitamin D deficiency     Past Surgical History:  Procedure Laterality Date  . ABDOMINAL HYSTERECTOMY    . ABDOMINAL HYSTERECTOMY     Partial  . bladder stim  2007  . BLADDER SURGERY     x 2. tacking of bladder and the other was the stimulator placement  . CHOLECYSTECTOMY    . INTERSTIM-GENERATOR CHANGE N/A 12/31/2017   Procedure: INTERSTIM-GENERATOR CHANGE;  Surgeon: Bjorn Loser, MD;  Location: [facility] ORS;  Service: Urology;  Laterality: N/A;  . LIVER BIOPSY  March 2016   Negative  . LUNG BIOPSY  2015   small spot on lung  . TUBAL LIGATION      Social History   Tobacco Use  . Smoking status: Never Smoker  . Smokeless tobacco: Never Used  Substance Use Topics  . Alcohol use: No     Current Outpatient Medications:  .  albuterol (PROVENTIL HFA;VENTOLIN HFA) 108 (90 Base) MCG/ACT inhaler, Inhale 2 puffs into the lungs every 4 (four) hours as needed for wheezing or shortness of breath., Disp: , Rfl:  .  amLODipine (NORVASC) 2.5 MG tablet, Take 1 tablet (2.5 mg total) by mouth daily., Disp: 30 tablet, Rfl: 1 .  atorvastatin (LIPITOR) 80 MG tablet, Take 80 mg by mouth at bedtime. , Disp: , Rfl:  .  budesonide-formoterol (SYMBICORT) 160-4.5 MCG/ACT inhaler, Inhale 2 puffs into the lungs 2 (two) times daily., Disp: , Rfl:  .  cholecalciferol (VITAMIN D) 1000 units tablet, Take 1,000 Units by mouth  daily., Disp: , Rfl:  .  divalproex (DEPAKOTE ER) 250 MG 24 hr tablet, divalproex ER 250 mg tablet,extended release 24 hr, Disp: , Rfl:  .  DULoxetine (CYMBALTA) 60 MG capsule, Take 120 mg by mouth daily., Disp: , Rfl:  .  empagliflozin (JARDIANCE) 25 MG TABS tablet, Take 25 mg by mouth daily., Disp: , Rfl:  .  ezetimibe (ZETIA) 10 MG tablet, Take 10 mg by mouth daily. , Disp: , Rfl:  .  fenofibrate micronized (LOFIBRA) 134 MG capsule, Take 134 mg by mouth at bedtime. , Disp: , Rfl:  .  fluticasone (FLONASE) 50 MCG/ACT nasal spray, Place 2 sprays into both nostrils daily., Disp: 16 g, Rfl: 6 .  gabapentin (NEURONTIN) 300 MG capsule, Take 1 capsule (300 mg total) by mouth 2 (two) times daily., Disp: 60 capsule, Rfl: 5 .  HUMULIN  R U-500 KWIKPEN 500 UNIT/ML kwikpen, Inject 60 Units as directed 3 (three) times daily. , Disp: , Rfl:  .  hydrOXYzine (VISTARIL) 50 MG capsule, Take 50-100 mg by mouth daily as needed for anxiety. , Disp: , Rfl:  .  insulin aspart (NOVOLOG) 100 UNIT/ML injection, Inject 5-25 Units into the skin 3 (three) times daily before meals. , Disp: , Rfl:  .  ketorolac (TORADOL) 10 MG tablet, Take 1 tablet (10 mg total) by mouth every 6 (six) hours as needed., Disp: 20 tablet, Rfl: 0 .  lamoTRIgine (LAMICTAL) 200 MG tablet, Take 200 mg by mouth 2 (two) times daily. , Disp: , Rfl:  .  levothyroxine (SYNTHROID, LEVOTHROID) 75 MCG tablet, Take 1 tablet (75 mcg total) by mouth daily., Disp: 30 tablet, Rfl: 11 .  linaclotide (LINZESS) 72 MCG capsule, Take 1 capsule (72 mcg total) by mouth daily before breakfast., Disp: 90 capsule, Rfl: 3 .  lisinopril (PRINIVIL,ZESTRIL) 2.5 MG tablet, Take 1 tablet (2.5 mg total) by mouth daily., Disp: 30 tablet, Rfl: 1 .  metoprolol tartrate (LOPRESSOR) 25 MG tablet, Take one tablet 25 mg in the am and 1/2 tab in the pm, Disp: 45 tablet, Rfl: 6 .  ondansetron (ZOFRAN ODT) 4 MG disintegrating tablet, Take 1 tablet (4 mg total) by mouth every 8 (eight) hours  as needed for nausea or vomiting., Disp: 20 tablet, Rfl: 0 .  ondansetron (ZOFRAN) 4 MG tablet, Take 1 tablet (4 mg total) by mouth every 8 (eight) hours as needed for nausea or vomiting., Disp: 20 tablet, Rfl: 0 .  pantoprazole (PROTONIX) 40 MG tablet, Take 1 tablet (40 mg total) by mouth daily., Disp: 90 tablet, Rfl: 3 .  Prasterone (INTRAROSA) 6.5 MG INST, Place 1 suppository vaginally daily., Disp: 30 each, Rfl: 12 .  prochlorperazine (COMPAZINE) 5 MG tablet, Take 1 tablet (5 mg total) by mouth every 6 (six) hours as needed for nausea or vomiting., Disp: 12 tablet, Rfl: 0 .  QUEtiapine (SEROQUEL) 400 MG tablet, Take 400 mg by mouth at bedtime., Disp: , Rfl:  .  traMADol (ULTRAM) 50 MG tablet, Take 1 tablet (50 mg total) by mouth every 6 (six) hours as needed., Disp: 20 tablet, Rfl: 0 .  traZODone (DESYREL) 100 MG tablet, Take 100 mg by mouth at bedtime., Disp: , Rfl:  .  TRULICITY 1.5 FT/7.3UK SOPN, Inject 1.5 mLs as directed once a week. , Disp: , Rfl:   Allergies  Allergen Reactions  . Mirtazapine Anaphylaxis    REACTION: closes throat  . Morphine And Related     anaphylaxis  . Sulfamethoxazole-Trimethoprim Rash    REACTION: Whelps all over  . Amoxicillin Er Nausea And Vomiting    Has patient had a PCN reaction causing immediate rash, facial/tongue/throat swelling, SOB or lightheadedness with hypotension: No Has patient had a PCN reaction causing severe rash involving mucus membranes or skin necrosis: No Has patient had a PCN reaction that required hospitalization: No Has patient had a PCN reaction occurring within the last 10 years: Yes If all of the above answers are "NO", then may proceed with Cephalosporin use.  . Codeine Itching    REACTION: itch, rash  . Hydrocodone-Acetaminophen Rash    REACTION: rash  . Keflex [Cephalexin] Rash  . Prasterone Nausea And Vomiting    ROS   No other specific complaints in a complete review of systems (except as listed in HPI  above).  Objective  Vitals:   07/22/18 1100  BP: 106/70  Pulse: 100  Resp: 16  Temp: 98.1 F (36.7 C)  TempSrc: Oral  SpO2: 96%  Weight: 181 lb 12.8 oz (82.5 kg)  Height: 4' 11"  (1.499 m)    Body mass index is 36.72 kg/m.  Nursing Note and Vital Signs reviewed.  Physical Exam  Constitutional: She is oriented to person, place, and time. She appears well-developed and well-nourished. She is cooperative.  HENT:  Head: Normocephalic and atraumatic.  Right Ear: Hearing, external ear and ear canal normal.  Left Ear: Hearing, external ear and ear canal normal.  Nose: Right sinus exhibits maxillary sinus tenderness. Right sinus exhibits no frontal sinus tenderness. Left sinus exhibits no maxillary sinus tenderness and no frontal sinus tenderness.  Mouth/Throat: Oropharynx is clear and moist and mucous membranes are normal. Abnormal dentition (poor dentition with tenderness to right upper molars). No oropharyngeal exudate.  Eyes: Pupils are equal, round, and reactive to light. Conjunctivae and EOM are normal. Right eye exhibits no discharge. Left eye exhibits no discharge.    Cardiovascular: Normal rate.  Pulmonary/Chest: Effort normal.  Abdominal: Normal appearance.  Musculoskeletal: Normal range of motion.  Lymphadenopathy:    She has no cervical adenopathy.  Neurological: She is alert and oriented to person, place, and time.  Psychiatric: She has a normal mood and affect. Her speech is normal and behavior is normal. Judgment and thought content normal.     No results found for this or any previous visit (from the past 48 hour(s)).  Assessment & Plan  1. Watery eyes - loratadine (CLARITIN) 10 MG tablet; Take 1 tablet (10 mg total) by mouth daily.  Dispense: 30 tablet; Refill: 0  2. Dental abscess - dentist information given; please see dentist within the next month - clindamycin (CLEOCIN) 300 MG capsule; Take 1 capsule (300 mg total) by mouth 3 (three) times daily for 7  days.  Dispense: 21 capsule; Refill: 0  3. Right facial swelling Discussed ER precautions- no eye involvement and significant improvement- considered allergic reaction however patient also has likely dental abscess will treat with clinda for duel coverage - clindamycin (CLEOCIN) 300 MG capsule; Take 1 capsule (300 mg total) by mouth 3 (three) times daily for 7 days.  Dispense: 21 capsule; Refill: 0   -Red flags and when to present for emergency care or RTC including fever >101.71F,  new/worsening/un-resolving symptoms, eye pain, worsening swelling, vision changes reviewed with patient at time of visit. Follow up and care instructions discussed and provided in AVS.

## 2018-07-22 NOTE — Patient Instructions (Addendum)
-   Please see dentist for tooth pain - Take antibiotic three times a day with food in your stomach. To prevent gut issues with antibiotic I recommend taking a probiotic daily starting today and for one month after completing antibiotic.  - If swelling worsens at any point please come in right away; if swelling is not improved in 2 days please follow up.

## 2018-07-24 ENCOUNTER — Ambulatory Visit: Payer: Self-pay | Admitting: Nurse Practitioner

## 2018-07-25 NOTE — Progress Notes (Signed)
Please let patient know that her white count was a little up, but not worrisome. She is no longer anemic. Her iron studies are normal. Her vitamin B12 is low, so she can either take 500 mcg of vitamin B12 under her tongue every day or come in once a month for 1000 mcg IM injections x 3 months and then recheck (up to her, schedule if she wants the injections). She looks dehydrated, so encourage more water. Please confirm who her nephrologist is and fax copy of labs.

## 2018-07-31 ENCOUNTER — Telehealth: Payer: Self-pay | Admitting: Family Medicine

## 2018-07-31 NOTE — Telephone Encounter (Signed)
Result note read to patient; verbalizes understanding. Pt prefers B12 injections; will CB to schedule. States her nephrologist is Dr. Eduard Clos.

## 2018-08-07 ENCOUNTER — Encounter: Payer: Self-pay | Admitting: Gastroenterology

## 2018-08-07 ENCOUNTER — Ambulatory Visit (INDEPENDENT_AMBULATORY_CARE_PROVIDER_SITE_OTHER): Payer: Medicare HMO | Admitting: Gastroenterology

## 2018-08-07 VITALS — BP 114/66 | HR 93 | Ht 59.0 in | Wt 189.4 lb

## 2018-08-07 DIAGNOSIS — R110 Nausea: Secondary | ICD-10-CM

## 2018-08-07 DIAGNOSIS — R112 Nausea with vomiting, unspecified: Secondary | ICD-10-CM

## 2018-08-07 NOTE — Progress Notes (Signed)
Primary Care Physician: Arnetha Courser, MD  Primary Gastroenterologist:  Dr. [name]  Chief Complaint  Patient presents with  . Nausea and vomiting    HPI: [name] is a 52 y.o. female here who has a history of having a colonoscopy with me back in 2015 with adenomatous polyps found at that time.  The patient was now sent here for evaluation due to nausea when she eats.  The patient has a history of fatty liver and GERD.  The patient is seen by neurology for peripheral neuropathy.  The patient was also in the ER recently for dental abscess.  She also carries a diagnosis of schizoaffective disorder. The patient has alternating diarrhea and constipation.  The patient has been trying to lose weight since last year and has gone down significantly.  [name] weight and August of this year was 8 pounds then she is today.  The patient also reports that she takes her pantoprazole in the morning and usually has her nausea mostly in the morning also.  Current Outpatient Medications  Medication Sig Dispense Refill  . albuterol (PROVENTIL HFA;VENTOLIN HFA) 108 (90 Base) MCG/ACT inhaler Inhale 2 puffs into the lungs every 4 (four) hours as needed for wheezing or shortness of breath.    Marland Kitchen amLODipine (NORVASC) 2.5 MG tablet Take 1 tablet (2.5 mg total) by mouth daily. 30 tablet 1  . atorvastatin (LIPITOR) 80 MG tablet Take 80 mg by mouth at bedtime.     . budesonide-formoterol (SYMBICORT) 160-4.5 MCG/ACT inhaler Inhale 2 puffs into the lungs 2 (two) times daily.    . cholecalciferol (VITAMIN D) 1000 units tablet Take 1,000 Units by mouth daily.    . DULoxetine (CYMBALTA) 60 MG capsule Take 120 mg by mouth daily.    . empagliflozin (JARDIANCE) 25 MG TABS tablet Take 25 mg by mouth daily.    Marland Kitchen ezetimibe (ZETIA) 10 MG tablet Take 10 mg by mouth daily.     . fenofibrate micronized (LOFIBRA) 134 MG capsule Take 134 mg by mouth at bedtime.     . fluticasone (FLONASE) 50 MCG/ACT nasal spray  Place 2 sprays into both nostrils daily. 16 g 6  . gabapentin (NEURONTIN) 300 MG capsule Take 1 capsule (300 mg total) by mouth 2 (two) times daily. 60 capsule 5  . HUMULIN R U-500 KWIKPEN 500 UNIT/ML kwikpen Inject 60 Units as directed 3 (three) times daily.     . insulin aspart (NOVOLOG) 100 UNIT/ML injection Inject 5-25 Units into the skin 3 (three) times daily before meals.     . lamoTRIgine (LAMICTAL) 200 MG tablet Take 200 mg by mouth 2 (two) times daily.     Marland Kitchen levothyroxine (SYNTHROID, LEVOTHROID) 75 MCG tablet Take 1 tablet (75 mcg total) by mouth daily. 30 tablet 11  . linaclotide (LINZESS) 72 MCG capsule Take 1 capsule (72 mcg total) by mouth daily before breakfast. 90 capsule 3  . lisinopril (PRINIVIL,ZESTRIL) 2.5 MG tablet Take 1 tablet (2.5 mg total) by mouth daily. 30 tablet 1  . loratadine (CLARITIN) 10 MG tablet Take 1 tablet (10 mg total) by mouth daily. 30 tablet 0  . metoprolol tartrate (LOPRESSOR) 25 MG tablet Take one tablet 25 mg in the am and 1/2 tab in the pm 45 tablet 6  . ondansetron (ZOFRAN ODT) 4 MG disintegrating tablet Take 1 tablet (4 mg total) by mouth every 8 (eight) hours as needed for nausea or vomiting. 20 tablet 0  . ondansetron (ZOFRAN) 4  MG tablet Take 1 tablet (4 mg total) by mouth every 8 (eight) hours as needed for nausea or vomiting. 20 tablet 0  . pantoprazole (PROTONIX) 40 MG tablet Take 1 tablet (40 mg total) by mouth daily. 90 tablet 3  . prochlorperazine (COMPAZINE) 5 MG tablet Take 1 tablet (5 mg total) by mouth every 6 (six) hours as needed for nausea or vomiting. 12 tablet 0  . QUEtiapine (SEROQUEL) 400 MG tablet Take 400 mg by mouth at bedtime.    . TRULICITY 1.5 CN/4.7SJ SOPN Inject 1.5 mLs as directed once a week.     . divalproex (DEPAKOTE ER) 250 MG 24 hr tablet divalproex ER 250 mg tablet,extended release 24 hr    . hydrOXYzine (VISTARIL) 50 MG capsule Take 50-100 mg by mouth daily as needed for anxiety.     Marland Kitchen ketorolac (TORADOL) 10 MG tablet  Take 1 tablet (10 mg total) by mouth every 6 (six) hours as needed. (Patient not taking: Reported on 08/07/2018) 20 tablet 0  . Prasterone (INTRAROSA) 6.5 MG INST Place 1 suppository vaginally daily. (Patient not taking: Reported on 08/07/2018) 30 each 12  . traMADol (ULTRAM) 50 MG tablet Take 1 tablet (50 mg total) by mouth every 6 (six) hours as needed. (Patient not taking: Reported on 08/07/2018) 20 tablet 0  . traZODone (DESYREL) 100 MG tablet Take 100 mg by mouth at bedtime.    Marland Kitchen ULTICARE SHORT PEN NEEDLES 31G X 8 MM MISC      No current facility-administered medications for this visit.     Allergies as of 08/07/2018 - Review Complete 08/07/2018  Allergen Reaction Noted  . Mirtazapine Anaphylaxis   . Morphine and related  10/20/2013  . Sulfamethoxazole-trimethoprim Rash 06/09/2008  . Amoxicillin er Nausea And Vomiting 12/24/2017  . Codeine Itching   . Hydrocodone-acetaminophen Rash   . Keflex [cephalexin] Rash 07/04/2018  . Prasterone Nausea And Vomiting 12/24/2017    ROS:  General: Negative for anorexia, weight loss, fever, chills, fatigue, weakness. ENT: Negative for hoarseness, difficulty swallowing , nasal congestion. CV: Negative for chest pain, angina, palpitations, dyspnea on exertion, peripheral edema.  Respiratory: Negative for dyspnea at rest, dyspnea on exertion, cough, sputum, wheezing.  GI: See history of present illness. GU:  Negative for dysuria, hematuria, urinary incontinence, urinary frequency, nocturnal urination.  Endo: Negative for unusual weight change.    Physical Examination:   BP 114/66   Pulse 93   Ht 4' 11"  (1.499 m)   Wt 189 lb 6.4 oz (85.9 kg)   BMI 38.25 kg/m   General: Well-nourished, well-developed in no acute distress.  Eyes: No icterus. Conjunctivae pink. Mouth: Oropharyngeal mucosa moist and pink , no lesions erythema or exudate. Lungs: Clear to auscultation bilaterally. Non-labored. Heart: Regular rate and rhythm, no murmurs rubs or  gallops.  Abdomen: Bowel sounds are normal, nontender, nondistended, no hepatosplenomegaly or masses, no abdominal bruits or hernia , no rebound or guarding.   Extremities: No lower extremity edema. No clubbing or deformities. Neuro: Alert and oriented x 3.  Grossly intact. Skin: Warm and dry, no jaundice.  There is a breakdown of the skin around the umbilicus. Psych: Alert and cooperative, normal mood and affect.  Labs:    Imaging Studies: No results found.  Assessment and Plan:   [name] is a 53 y.o. y/o female who has a history of diabetes with peripheral neuropathy and chronic nausea.  The patient will start taking her pantoprazole in the evening to decrease the amount  of acid in her esophagus when she lays down.  The patient will also be set up for a gastric emptying study due to her longstanding diabetes with peripheral neuropathy.  The patient has been explained the plan and agrees with it    [name], MD. Marval Regal   Note: This dictation was prepared with Dragon dictation along with smaller phrase technology. Any transcriptional errors that result from this process are unintentional.

## 2018-08-07 NOTE — Patient Instructions (Signed)
You are scheduled for a gastric emptying study at [facility] on Saturday, Dec 21st at 9:30am. Please arrive at the [facility] registration desk at [place] cannot have anything to eat or drink after midnight on Friday night. You cannot take any stomach medicines  8 hours prior to scan. This includes Pantoprazole, Zofran or pain medications.   If you need to reschedule this appointment for any reason, please contact central scheduling at [phone].  Per Dr. Allen Norris, take your Pantoprazole at night.

## 2018-08-08 ENCOUNTER — Other Ambulatory Visit (HOSPITAL_COMMUNITY)
Admission: RE | Admit: 2018-08-08 | Discharge: 2018-08-08 | Disposition: A | Discharge status: Home / Self Care | Payer: Medicare HMO | Source: Ambulatory Visit | Attending: Family Medicine | Admitting: Family Medicine

## 2018-08-08 ENCOUNTER — Encounter: Payer: Self-pay | Admitting: Family Medicine

## 2018-08-08 ENCOUNTER — Ambulatory Visit (INDEPENDENT_AMBULATORY_CARE_PROVIDER_SITE_OTHER): Payer: Medicare HMO | Admitting: Family Medicine

## 2018-08-08 VITALS — BP 102/60 | HR 94 | Temp 98.7°F | Wt 189.4 lb

## 2018-08-08 DIAGNOSIS — J029 Acute pharyngitis, unspecified: Secondary | ICD-10-CM

## 2018-08-08 DIAGNOSIS — N760 Acute vaginitis: Secondary | ICD-10-CM | POA: Diagnosis present

## 2018-08-08 DIAGNOSIS — E538 Deficiency of other specified B group vitamins: Secondary | ICD-10-CM | POA: Diagnosis not present

## 2018-08-08 DIAGNOSIS — N898 Other specified noninflammatory disorders of vagina: Secondary | ICD-10-CM

## 2018-08-08 DIAGNOSIS — L309 Dermatitis, unspecified: Secondary | ICD-10-CM

## 2018-08-08 LAB — POCT RAPID STREP A (OFFICE): Rapid Strep A Screen: POSITIVE — AB

## 2018-08-08 MED ORDER — FLUCONAZOLE 150 MG PO TABS: 150.0000 mg | ORAL_TABLET | Freq: Every day | ORAL | 0 refills | Status: DC

## 2018-08-08 MED ORDER — AZITHROMYCIN 250 MG PO TABS: ORAL_TABLET | ORAL | 0 refills | Status: DC

## 2018-08-08 MED ORDER — TRIAMCINOLONE ACETONIDE 0.1 % EX CREA: 1.0000 "application " | TOPICAL_CREAM | Freq: Three times a day (TID) | CUTANEOUS | 0 refills | Status: DC

## 2018-08-08 NOTE — Assessment & Plan Note (Signed)
Monthly vitamin B12 1000 mcg IM; first today

## 2018-08-08 NOTE — Patient Instructions (Signed)
Start the antibioitics for presumed strep throat Please do eat yogurt or kimchi or take a probiotic daily for the next month We want to replace the healthy germs in the gut If you notice foul, watery diarrhea in the next two months, schedule an appointment RIGHT AWAY or go to an urgent care or the emergency room if a holiday or over a weekend  Use the yeast infection medicine if needed; do not take your atorvastatin for 3 days after the fluconazole  Use the new cream if needed for the itchy spots on your belly

## 2018-08-08 NOTE — Progress Notes (Signed)
BP 102/60   Pulse 94   Temp 98.7 F (37.1 C) (Oral)   Wt 189 lb 6.4 oz (85.9 kg)   SpO2 92%   BMI 38.25 kg/m    Subjective:    Patient ID: [name], female    DOB: June 20, 1966, 52 y.o.   MRN: [id number]  HPI: [name] is a 51 y.o. female  Chief Complaint  Patient presents with  . Sore Throat    onset 1 day  . Vaginitis    vaginal itching, receently on antibiotics  . Rash    itching, reddness and bumps    HPI Patient is here for acute issues  She has had sore throat for one day; throat swab collected Daughter and granddaughter have had documented strep throat  She also has vaginal itching; was on antibiotics recently; thinks she has a yeast infection; has had these before and feels the lsame  She has a rash, with some itchy red bumps; on the abdomen; really itching; no other spots anywhere else; going on for a few days; she'll scratch at them  Due for vitamin B12 injection; deficiency noted on previous labs; poor energy  Depression screen [facility] 2/9 08/15/2018 08/08/2018 07/18/2018 04/16/2018 11/28/2017  Decreased Interest 0 0 0 0 0  Down, Depressed, Hopeless 0 0 0 0 1  PHQ - 2 Score 0 0 0 0 1  Altered sleeping 0 0 0 - -  Tired, decreased energy 0 0 0 - -  Change in appetite 0 0 0 - -  Feeling bad or failure about yourself  0 0 0 - -  Trouble concentrating 0 0 0 - -  Moving slowly or fidgety/restless 0 0 0 - -  Suicidal thoughts 0 0 0 - -  PHQ-9 Score 0 0 0 - -  Difficult doing work/chores Not difficult at all Not difficult at all Not difficult at all - -  Some recent data might be hidden   Fall Risk  08/15/2018 08/08/2018 07/22/2018 07/18/2018 04/16/2018  Falls in the past year? 0 0 0 1 Yes  Comment - - - - -  Number falls in past yr: 0 - 0 1 1  Comment - - - - -  Injury with Fall? 0 - - 0 No  Comment - - - - -  Risk for fall due to : - - - Impaired balance/gait;Medication side effect -  Follow up - - - - -    Relevant past medical, surgical, family  and social history reviewed Past Medical History:  Diagnosis Date  . Anxiety   . Arthritis   . Asthma   . Bipolar depression ([place])   . CHF (congestive heart failure) ([place])   . Chronic headaches    migraines have lessened  . Chronic kidney disease    ddstage 111  . COPD (chronic obstructive pulmonary disease) ([place])   . Coronary artery disease   . Depression   . Diabetic neuropathy ([place])   . Dyspnea    uses inhalers for this  . Dysrhythmia    rapid  . Fatty liver   . GERD (gastroesophageal reflux disease)   . Headache   . Heart attack ([place]) 2009  . Hyperlipidemia   . Hypertension   . Hypomagnesemia   . Hypothyroid   . Irregular heart rhythm   . Liver lesion    Favored to be benign on CT; MRI of abd w/contract in Aug 2016. Liver Biopsy-Negative, March 2016  . Morbid  obesity ([place])   . OSA (obstructive sleep apnea)   . Peripheral vascular disease ([place])   . Pinched nerve    Back  . Pneumonia   . RLS (restless legs syndrome)   . Seasonal allergies   . Sleep apnea    uses oxygen at night 2L d/t cpap did not fit  . Type 2 diabetes, uncontrolled, with neuropathy ([place]) 10/15/2015   Overview:  Microalbumin 7.7 12/2010: Hgb A1c 10.2% 12/2010 and 9.7% 03/2011, eye exam 10/2009 [facility] DM clinic   . Urinary incontinence   . Vitamin D deficiency    Past Surgical History:  Procedure Laterality Date  . ABDOMINAL HYSTERECTOMY    . ABDOMINAL HYSTERECTOMY     Partial  . bladder stim  2007  . BLADDER SURGERY     x 2. tacking of bladder and the other was the stimulator placement  . CHOLECYSTECTOMY    . INTERSTIM-GENERATOR CHANGE N/A 12/31/2017   Procedure: INTERSTIM-GENERATOR CHANGE;  Surgeon: Bjorn Loser, MD;  Location: [facility] ORS;  Service: Urology;  Laterality: N/A;  . LIVER BIOPSY  March 2016   Negative  . LUNG BIOPSY  2015   small spot on lung  . TUBAL LIGATION     Family History  Problem Relation Age of Onset  . Diabetes Mother   . Heart disease Mother   . Hyperlipidemia  Mother   . Hypertension Mother   . Kidney disease Mother   . Mental illness Mother   . Hypothyroidism Mother   . Stroke Mother   . Osteoporosis Mother   . Glaucoma Mother   . Congestive Heart Failure Mother   . Hypertension Father   . Asthma Daughter   . Cancer Daughter        throat  . Bipolar disorder Daughter   . Heart disease Maternal Grandfather   . Rheum arthritis Maternal Grandfather   . Cancer Maternal Grandfather        liver  . Kidney disease Maternal Grandfather   . Cancer Paternal Grandmother        lung  . Kidney disease Brother   . Breast cancer Maternal Grandmother 60  . Bipolar disorder Daughter   . Hypertension Daughter   . Restless legs syndrome Daughter    Social History   Tobacco Use  . Smoking status: Never Smoker  . Smokeless tobacco: Never Used  Substance Use Topics  . Alcohol use: No  . Drug use: No     Office Visit from 08/08/2018 in [facility]  AUDIT-C Score  0      Interim medical history since last visit reviewed. Allergies and medications reviewed  Review of Systems  HENT: Positive for sore throat.   Genitourinary: Positive for vaginal discharge.  Skin: Positive for rash.   Per HPI unless specifically indicated above     Objective:    BP 102/60   Pulse 94   Temp 98.7 F (37.1 C) (Oral)   Wt 189 lb 6.4 oz (85.9 kg)   SpO2 92%   BMI 38.25 kg/m   Wt Readings from Last 3 Encounters:  08/15/18 186 lb (84.4 kg)  08/08/18 189 lb 6.4 oz (85.9 kg)  08/07/18 189 lb 6.4 oz (85.9 kg)    Physical Exam Constitutional:      Appearance: She is well-developed and well-nourished.  HENT:     Right Ear: A middle ear effusion is present. Tympanic membrane is not erythematous.     Left Ear: A middle ear effusion  is present. Tympanic membrane is not erythematous.     Mouth/Throat:     Mouth: Mucous membranes are normal.     Pharynx: Posterior oropharyngeal erythema present. No oropharyngeal exudate or posterior  oropharyngeal edema.  Eyes:     General: No scleral icterus.    Extraocular Movements: EOM normal.  Cardiovascular:     Rate and Rhythm: Normal rate and regular rhythm.  Pulmonary:     Effort: Pulmonary effort is normal.     Breath sounds: Normal breath sounds.  Lymphadenopathy:     Cervical: Cervical adenopathy present.  Skin:    Findings: Rash (erythematous, scabbed lesions around umbilicus) present.  Psychiatric:        Mood and Affect: Mood and affect normal.        Behavior: Behavior normal.    Diabetic Foot Form - Detailed   Diabetic Foot Exam - detailed Diabetic Foot exam was performed with the following findings:  Yes 08/08/2018  5:02 PM  Visual Foot Exam completed.:  Yes  Pulse Foot Exam completed.:  Yes  Sensory Foot Exam Completed.:  Yes Semmes-Weinstein Monofilament Test           Assessment & Plan:   Problem List Items Addressed This Visit      Genitourinary   Vaginitis   Relevant Orders   Cervicovaginal ancillary only (Completed)     Other   Vitamin B12 deficiency    Monthly vitamin B12 1000 mcg IM; first today       Other Visit Diagnoses    Sore throat    -  Primary   with positive family members (2) with documented strep throat recently; will treat with Rx empirically, culture pending   Relevant Orders   Culture, Group A Strep (Completed)   POCT rapid strep A (Completed)   Vaginal discharge       Relevant Orders   Cervicovaginal ancillary only (Completed)   Dermatitis       try to avoid scratching; Rx for corticosteroid provided       Follow up plan: Return if symptoms worsen or fail to improve, for monthly for B12 shots with CMA x 6 months.  An after-visit summary was printed and given to the patient at [place].  Please see the patient instructions which may contain other information and recommendations beyond what is mentioned above in the assessment and plan.  Meds ordered this encounter  Medications  . DISCONTD: azithromycin  (ZITHROMAX) 250 MG tablet    Sig: Two by mouth on day one, then one daily for four more days    Dispense:  6 tablet    Refill:  0  . DISCONTD: fluconazole (DIFLUCAN) 150 MG tablet    Sig: Take 1 tablet (150 mg total) by mouth daily. Do not take cholesterol medicine (statin) for 3 days    Dispense:  1 tablet    Refill:  0  . triamcinolone cream (KENALOG) 0.1 %    Sig: Apply 1 application topically 3 (three) times daily. On the abdomen to help with itching; do not use on face    Dispense:  30 g    Refill:  0    Orders Placed This Encounter  Procedures  . Culture, Group A Strep  . POCT rapid strep A

## 2018-08-09 LAB — CERVICOVAGINAL ANCILLARY ONLY
Bacterial vaginitis: NEGATIVE
CANDIDA VAGINITIS: POSITIVE — AB
TRICH (WINDOWPATH): NEGATIVE

## 2018-08-09 NOTE — Progress Notes (Signed)
[name], please let the patient know that her vaginal test showed that she definitely had a yeast infection. I hope the medicine will clear that up. If not, let us know.

## 2018-08-10 LAB — CULTURE, GROUP A STREP
MICRO NUMBER:: 91459395
SPECIMEN QUALITY:: ADEQUATE

## 2018-08-15 ENCOUNTER — Ambulatory Visit: Payer: Self-pay | Admitting: Family Medicine

## 2018-08-15 ENCOUNTER — Encounter: Payer: Self-pay | Admitting: Nurse Practitioner

## 2018-08-15 ENCOUNTER — Ambulatory Visit (INDEPENDENT_AMBULATORY_CARE_PROVIDER_SITE_OTHER): Payer: Medicare HMO | Admitting: Nurse Practitioner

## 2018-08-15 VITALS — BP 108/70 | HR 96 | Temp 98.2°F | Resp 16 | Ht 59.0 in | Wt 186.0 lb

## 2018-08-15 DIAGNOSIS — R059 Cough, unspecified: Secondary | ICD-10-CM

## 2018-08-15 DIAGNOSIS — R05 Cough: Secondary | ICD-10-CM | POA: Diagnosis not present

## 2018-08-15 DIAGNOSIS — J0110 Acute frontal sinusitis, unspecified: Secondary | ICD-10-CM | POA: Diagnosis not present

## 2018-08-15 DIAGNOSIS — E538 Deficiency of other specified B group vitamins: Secondary | ICD-10-CM

## 2018-08-15 MED ORDER — DOXYCYCLINE HYCLATE 100 MG PO TABS: 100.0000 mg | ORAL_TABLET | Freq: Two times a day (BID) | ORAL | 0 refills | Status: DC

## 2018-08-15 MED ORDER — CYANOCOBALAMIN 1000 MCG/ML IJ SOLN
1000.0000 ug | Freq: Once | INTRAMUSCULAR | Status: AC
  Administered 2018-08-15: 1000 ug via INTRAMUSCULAR

## 2018-08-15 MED ORDER — BENZONATATE 100 MG PO CAPS: 100.0000 mg | ORAL_CAPSULE | Freq: Three times a day (TID) | ORAL | 0 refills | Status: DC | PRN

## 2018-08-15 NOTE — Progress Notes (Signed)
Name: [name]   MRN: [id number]    DOB: Sep 03, 1966   Date:08/15/2018       Progress Note  Subjective  Chief Complaint  Chief Complaint  Patient presents with  . Cough    patient stated that she finished the Z pack on wednsday or thursday of last week  . Shortness of Breath  . B12 Injection    HPI  Just completed z-pack last week for strep throat; states symptoms resolved then yesterday had sudden onset of strong cough and blowing copious amounts of sputum from nose- facial fullness, fevers and chills. Took some tylenol without relief. Unable to get any sleep. Patient endorses shortness of breath started yesterday when she coughs- has coughing fits and takes her awhile to catch her breath again.   Patient Active Problem List   Diagnosis Date Noted  . Vitamin B12 deficiency 08/08/2018  . Encounter for Medicare annual wellness exam 10/30/2017  . Patient is full code 10/30/2017  . Disorder of bursae of shoulder region 10/09/2017  . Coronary artery disease 10/09/2017  . Plantar fascial fibromatosis 10/09/2017  . Tendinitis of wrist 10/09/2017  . Schizoaffective disorder, bipolar type ([place]) 09/28/2017  . Chest pain 09/10/2017  . Lobar pneumonia ([place]) 06/05/2017  . Multifocal pneumonia 05/27/2017  . Pleuritic chest pain 05/27/2017  . Hypertriglyceridemia 12/21/2016  . Pansinusitis 11/15/2016  . 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 Breast cancer screening 09/28/2016  . Urine frequency 09/28/2016  . Preventative health care 09/28/2016  . Fatigue 05/01/2016  . Urinary tract infection 03/14/2016  . Adenomatous colon polyp 10/15/2015  . Airway hyperreactivity 10/15/2015  . Bipolar affective disorder ([place]) 10/15/2015  . CN (constipation) 10/15/2015  . CAFL (chronic airflow limitation) ([place]) 10/15/2015  . Drug-induced hepatic toxicity 10/15/2015  . Blood in feces 10/15/2015  . HLD (hyperlipidemia) 10/15/2015  . Hypoxemia 10/15/2015  . NASH (nonalcoholic  steatohepatitis) 10/15/2015  . Morbid obesity ([place]) 10/15/2015  . Restless leg syndrome 10/15/2015  . Type 2 diabetes, uncontrolled, with neuropathy ([place]) 10/15/2015  . Elevated alkaline phosphatase level 09/22/2015  . Urinary bladder incontinence 09/22/2015  . Low back pain with left-sided sciatica 08/18/2015  . Encounter for monitoring tricyclic antidepressant therapy 05/13/2015  . Bipolar disorder ([place]) 05/11/2015  . Liver disease 05/11/2015  . Apnea, sleep 05/05/2015  . Heart valve disease 05/05/2015  . Chronic kidney disease, stage III (moderate) (HCC) 03/15/2015  . Migraine 03/12/2015  . COPD (chronic obstructive pulmonary disease) ([place]) 01/20/2015  . 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[place]) 01/20/2015  . GERD (gastroesophageal reflux disease) 01/20/2015  . Hypothyroid   . Vitamin D deficiency   . Urinary incontinence   . Liver lesion   . Hypomagnesemia   . Obstructive sleep apnea 10/20/2013  . Vaginitis 12/19/2011  . PERIPHERAL NEUROPATHY 03/04/2009  . HEMORRHOIDS, EXTERNAL 09/03/2008  . ALLERGIC RHINITIS 07/04/2007  . ASTHMA, PERSISTENT, MODERATE 07/04/2007  . INTERSTITIAL CYSTITIS 07/04/2007  . HYPERTENSION, BENIGN ESSENTIAL 06/19/2007    Past Medical History:  Diagnosis Date  . Anxiety   . Arthritis   . Asthma   . Bipolar depression ([place])   . CHF (congestive heart failure) ([place])   . Chronic headaches    migraines have lessened  . Chronic kidney disease    ddstage 111  . COPD (chronic obstructive pulmonary disease) ([place])   . Coronary artery disease   . Depression   . Diabetic neuropathy ([place])   . Dyspnea    uses inhalers for this  .  Dysrhythmia    rapid  . Fatty liver   . GERD (gastroesophageal reflux disease)   . Headache   . Heart attack ([place]) 2009  . Hyperlipidemia   . Hypertension   . Hypomagnesemia   . Hypothyroid   . Irregular heart rhythm   . Liver lesion    Favored to be benign on CT; MRI of abd w/contract in Aug 2016. Liver Biopsy-Negative,  March 2016  . Morbid obesity ([place])   . OSA (obstructive sleep apnea)   . Peripheral vascular disease ([place])   . Pinched nerve    Back  . Pneumonia   . RLS (restless legs syndrome)   . Seasonal allergies   . Sleep apnea    uses oxygen at night 2L d/t cpap did not fit  . Type 2 diabetes, uncontrolled, with neuropathy ([place]) 10/15/2015   Overview:  Microalbumin 7.7 12/2010: Hgb A1c 10.2% 12/2010 and 9.7% 03/2011, eye exam 10/2009 [facility]   . Urinary incontinence   . Vitamin D deficiency     Past Surgical History:  Procedure Laterality Date  . ABDOMINAL HYSTERECTOMY    . ABDOMINAL HYSTERECTOMY     Partial  . bladder stim  2007  . BLADDER SURGERY     x 2. tacking of bladder and the other was the stimulator placement  . CHOLECYSTECTOMY    . INTERSTIM-GENERATOR CHANGE N/A 12/31/2017   Procedure: INTERSTIM-GENERATOR CHANGE;  Surgeon: Bjorn Loser, MD;  Location: [facility] ORS;  Service: Urology;  Laterality: N/A;  . LIVER BIOPSY  March 2016   Negative  . LUNG BIOPSY  2015   small spot on lung  . TUBAL LIGATION      Social History   Tobacco Use  . Smoking status: Never Smoker  . Smokeless tobacco: Never Used  Substance Use Topics  . Alcohol use: No     Current Outpatient Medications:  .  albuterol (PROVENTIL HFA;VENTOLIN HFA) 108 (90 Base) MCG/ACT inhaler, Inhale 2 puffs into the lungs every 4 (four) hours as needed for wheezing or shortness of breath., Disp: , Rfl:  .  amLODipine (NORVASC) 2.5 MG tablet, Take 1 tablet (2.5 mg total) by mouth daily., Disp: 30 tablet, Rfl: 1 .  atorvastatin (LIPITOR) 80 MG tablet, Take 80 mg by mouth at bedtime. , Disp: , Rfl:  .  benzonatate (TESSALON PERLES) 100 MG capsule, Take 1 capsule (100 mg total) by mouth 3 (three) times daily as needed for cough., Disp: 30 capsule, Rfl: 0 .  budesonide-formoterol (SYMBICORT) 160-4.5 MCG/ACT inhaler, Inhale 2 puffs into the lungs 2 (two) times daily., Disp: , Rfl:  .  cholecalciferol (VITAMIN D) 1000  units tablet, Take 1,000 Units by mouth daily., Disp: , Rfl:  .  divalproex (DEPAKOTE ER) 250 MG 24 hr tablet, divalproex ER 250 mg tablet,extended release 24 hr, Disp: , Rfl:  .  doxycycline (VIBRA-TABS) 100 MG tablet, Take 1 tablet (100 mg total) by mouth 2 (two) times daily., Disp: 20 tablet, Rfl: 0 .  DULoxetine (CYMBALTA) 60 MG capsule, Take 120 mg by mouth daily., Disp: , Rfl:  .  empagliflozin (JARDIANCE) 25 MG TABS tablet, Take 25 mg by mouth daily., Disp: , Rfl:  .  ezetimibe (ZETIA) 10 MG tablet, Take 10 mg by mouth daily. , Disp: , Rfl:  .  fenofibrate micronized (LOFIBRA) 134 MG capsule, Take 134 mg by mouth at bedtime. , Disp: , Rfl:  .  fluticasone (FLONASE) 50 MCG/ACT nasal spray, Place 2 sprays into both nostrils  daily., Disp: 16 g, Rfl: 6 .  gabapentin (NEURONTIN) 300 MG capsule, Take 1 capsule (300 mg total) by mouth 2 (two) times daily., Disp: 60 capsule, Rfl: 5 .  HUMULIN R U-500 KWIKPEN 500 UNIT/ML kwikpen, Inject 60 Units as directed 3 (three) times daily. , Disp: , Rfl:  .  hydrOXYzine (VISTARIL) 50 MG capsule, Take 50-100 mg by mouth daily as needed for anxiety. , Disp: , Rfl:  .  insulin aspart (NOVOLOG) 100 UNIT/ML injection, Inject 5-25 Units into the skin 3 (three) times daily before meals. , Disp: , Rfl:  .  ketorolac (TORADOL) 10 MG tablet, Take 1 tablet (10 mg total) by mouth every 6 (six) hours as needed. (Patient not taking: Reported on 08/07/2018), Disp: 20 tablet, Rfl: 0 .  lamoTRIgine (LAMICTAL) 200 MG tablet, Take 200 mg by mouth 2 (two) times daily. , Disp: , Rfl:  .  levothyroxine (SYNTHROID, LEVOTHROID) 75 MCG tablet, Take 1 tablet (75 mcg total) by mouth daily., Disp: 30 tablet, Rfl: 11 .  linaclotide (LINZESS) 72 MCG capsule, Take 1 capsule (72 mcg total) by mouth daily before breakfast., Disp: 90 capsule, Rfl: 3 .  lisinopril (PRINIVIL,ZESTRIL) 2.5 MG tablet, Take 1 tablet (2.5 mg total) by mouth daily., Disp: 30 tablet, Rfl: 1 .  loratadine (CLARITIN) 10 MG  tablet, Take 1 tablet (10 mg total) by mouth daily. (Patient not taking: Reported on 08/08/2018), Disp: 30 tablet, Rfl: 0 .  metoprolol tartrate (LOPRESSOR) 25 MG tablet, Take one tablet 25 mg in the am and 1/2 tab in the pm, Disp: 45 tablet, Rfl: 6 .  ondansetron (ZOFRAN ODT) 4 MG disintegrating tablet, Take 1 tablet (4 mg total) by mouth every 8 (eight) hours as needed for nausea or vomiting., Disp: 20 tablet, Rfl: 0 .  ondansetron (ZOFRAN) 4 MG tablet, Take 1 tablet (4 mg total) by mouth every 8 (eight) hours as needed for nausea or vomiting. (Patient not taking: Reported on 08/08/2018), Disp: 20 tablet, Rfl: 0 .  pantoprazole (PROTONIX) 40 MG tablet, Take 1 tablet (40 mg total) by mouth daily., Disp: 90 tablet, Rfl: 3 .  Prasterone (INTRAROSA) 6.5 MG INST, Place 1 suppository vaginally daily. (Patient not taking: Reported on 08/07/2018), Disp: 30 each, Rfl: 12 .  prochlorperazine (COMPAZINE) 5 MG tablet, Take 1 tablet (5 mg total) by mouth every 6 (six) hours as needed for nausea or vomiting. (Patient not taking: Reported on 08/08/2018), Disp: 12 tablet, Rfl: 0 .  QUEtiapine (SEROQUEL) 400 MG tablet, Take 400 mg by mouth at bedtime., Disp: , Rfl:  .  traMADol (ULTRAM) 50 MG tablet, Take 1 tablet (50 mg total) by mouth every 6 (six) hours as needed. (Patient not taking: Reported on 08/07/2018), Disp: 20 tablet, Rfl: 0 .  traZODone (DESYREL) 100 MG tablet, Take 100 mg by mouth at bedtime., Disp: , Rfl:  .  triamcinolone cream (KENALOG) 0.1 %, Apply 1 application topically 3 (three) times daily. On the abdomen to help with itching; do not use on face, Disp: 30 g, Rfl: 0 .  TRULICITY 1.5 ST/4.1DQ SOPN, Inject 1.5 mLs as directed once a week. , Disp: , Rfl:  .  ULTICARE SHORT PEN NEEDLES 31G X 8 MM MISC, , Disp: , Rfl:   Allergies  Allergen Reactions  . Mirtazapine Anaphylaxis    REACTION: closes throat  . Morphine And Related     anaphylaxis  . Sulfamethoxazole-Trimethoprim Rash    REACTION: [place]  all over  . Amoxicillin Er Nausea And  Vomiting    Has patient had a PCN reaction causing immediate rash, facial/tongue/throat swelling, SOB or lightheadedness with hypotension: No Has patient had a PCN reaction causing severe rash involving mucus membranes or skin necrosis: No Has patient had a PCN reaction that required hospitalization: No Has patient had a PCN reaction occurring within the last 10 years: Yes If all of the above answers are "NO", then may proceed with Cephalosporin use.  . Codeine Itching    REACTION: itch, rash  . Hydrocodone-Acetaminophen Rash    REACTION: rash  . Keflex [Cephalexin] Rash  . Prasterone Nausea And Vomiting    ROS   No other specific complaints in a complete review of systems (except as listed in HPI above).  Objective  Vitals:   08/15/18 0828 08/15/18 0839  BP: 108/70   Pulse: (!) 110 96  Resp: 18 16  Temp: 98.2 F (36.8 C)   TempSrc: Oral   SpO2: 98%   Weight: 186 lb (84.4 kg)   Height: 4' 11"  (1.499 m)     Body mass index is 37.57 kg/m.  Nursing Note and Vital Signs reviewed.  Physical Exam HENT:     Head: Normocephalic and atraumatic.     Right Ear: Hearing, ear canal and external ear normal. A middle ear effusion is present.     Left Ear: Hearing, ear canal and external ear normal. A middle ear effusion is present.     Nose:     Right Nostril: No foreign body.     Left Nostril: No foreign body.     Right Turbinates: Enlarged.     Left Turbinates: Enlarged.     Right Sinus: Maxillary sinus tenderness and frontal sinus tenderness (very tender) present.     Left Sinus: Frontal sinus tenderness present. No maxillary sinus tenderness.     Mouth/Throat:     Lips: Pink.     Mouth: Mucous membranes are dry.     Pharynx: No oropharyngeal exudate.  Eyes:     General:        Right eye: No discharge.        Left eye: No discharge.     Extraocular Movements: Extraocular movements intact.     Conjunctiva/sclera: Conjunctivae  normal.     Pupils: Pupils are equal, round, and reactive to light.  Neck:     Musculoskeletal: Normal range of motion.  Cardiovascular:     Rate and Rhythm: Normal rate and regular rhythm.  Pulmonary:     Effort: Pulmonary effort is normal. No tachypnea or respiratory distress.     Breath sounds: Normal breath sounds.  Lymphadenopathy:     Cervical: No cervical adenopathy.  Skin:    General: Skin is warm and dry.     Findings: No rash.  Neurological:     Mental Status: She is alert.  Psychiatric:        Judgment: Judgment normal.     No results found for this or any previous visit (from the past 48 hour(s)).  Assessment & Plan 1. Acute non-recurrent frontal sinusitis - doxycycline (VIBRA-TABS) 100 MG tablet; Take 1 tablet (100 mg total) by mouth 2 (two) times daily.  Dispense: 20 tablet; Refill: 0  2. Vitamin B12 deficiency - cyanocobalamin ((VITAMIN B-12)) injection 1,000 mcg  3. Cough - benzonatate (TESSALON PERLES) 100 MG capsule; Take 1 capsule (100 mg total) by mouth 3 (three) times daily as needed for cough.  Dispense: 30 capsule; Refill: 0   Discussed ER and ROC  precautions

## 2018-08-15 NOTE — Patient Instructions (Addendum)
-   Please start taking doxycycline antibiotic twice a day with meals, recommend taking a probiotic to promote gut health while on this. - Take cough medicine as needed - Take tylenol 536m q 6 hours as needed - Can take musinex and/or neti pot to help get out the gunk in nose and lungs - Take good deep breath throughout the day to prevent pneumonia, use inhaler if needed - If having worsening shortness of breath please call in or go to get urgent medical attention.  Sinusitis, Adult Sinusitis is soreness and inflammation of your sinuses. Sinuses are hollow spaces in the bones around your face. They are located:  Around your eyes.  In the middle of your forehead.  Behind your nose.  In your cheekbones.  Your sinuses and nasal passages are lined with a stringy fluid (mucus). Mucus normally drains out of your sinuses. When your nasal tissues get inflamed or swollen, the mucus can get trapped or blocked so air cannot flow through your sinuses. This lets bacteria, viruses, and funguses grow, and that leads to infection. Follow these instructions at home: Medicines  Take, use, or apply over-the-counter and prescription medicines only as told by your doctor. These may include nasal sprays.  If you were prescribed an antibiotic medicine, take it as told by your doctor. Do not stop taking the antibiotic even if you start to feel better. Hydrate and Humidify  Drink enough water to keep your pee (urine) clear or pale yellow.  Use a cool mist humidifier to keep the humidity level in your home above 50%.  Breathe in steam for 10-15 minutes, 3-4 times a day or as told by your doctor. You can do this in the bathroom while a hot shower is running.  Try not to spend time in cool or dry air. Rest  Rest as much as possible.  Sleep with your head raised (elevated).  Make sure to get enough sleep each night. General instructions  Put a warm, moist washcloth on your face 3-4 times a day or as told  by your doctor. This will help with discomfort.  Wash your hands often with soap and water. If there is no soap and water, use hand sanitizer.  Do not smoke. Avoid being around people who are smoking (secondhand smoke).  Keep all follow-up visits as told by your doctor. This is important. Contact a doctor if:  You have a fever.  Your symptoms get worse.  Your symptoms do not get better within 10 days. Get help right away if:  You have a very bad headache.  You cannot stop throwing up (vomiting).  You have pain or swelling around your face or eyes.  You have trouble seeing.  You feel confused.  Your neck is stiff.  You have trouble breathing. This information is not intended to replace advice given to you by your health care provider. Make sure you discuss any questions you have with your health care provider. Document Released: 02/07/2008 Document Revised: 04/16/2016 Document Reviewed: 06/16/2015 Elsevier Interactive Patient Education  2[employer]

## 2018-08-20 IMAGING — CR DG LUMBAR SPINE 2-3V
1 series · 3 of 3 positions shown · non-contrast
Comparison: 12/04/2016.

CLINICAL DATA: Injury.

EXAM:
LUMBAR SPINE - 2-3 VIEW

[Series 1: dg lumbar spine 2-3 views · 0.14mm/px · 3 of 3 slices shown]
[im 1/3]
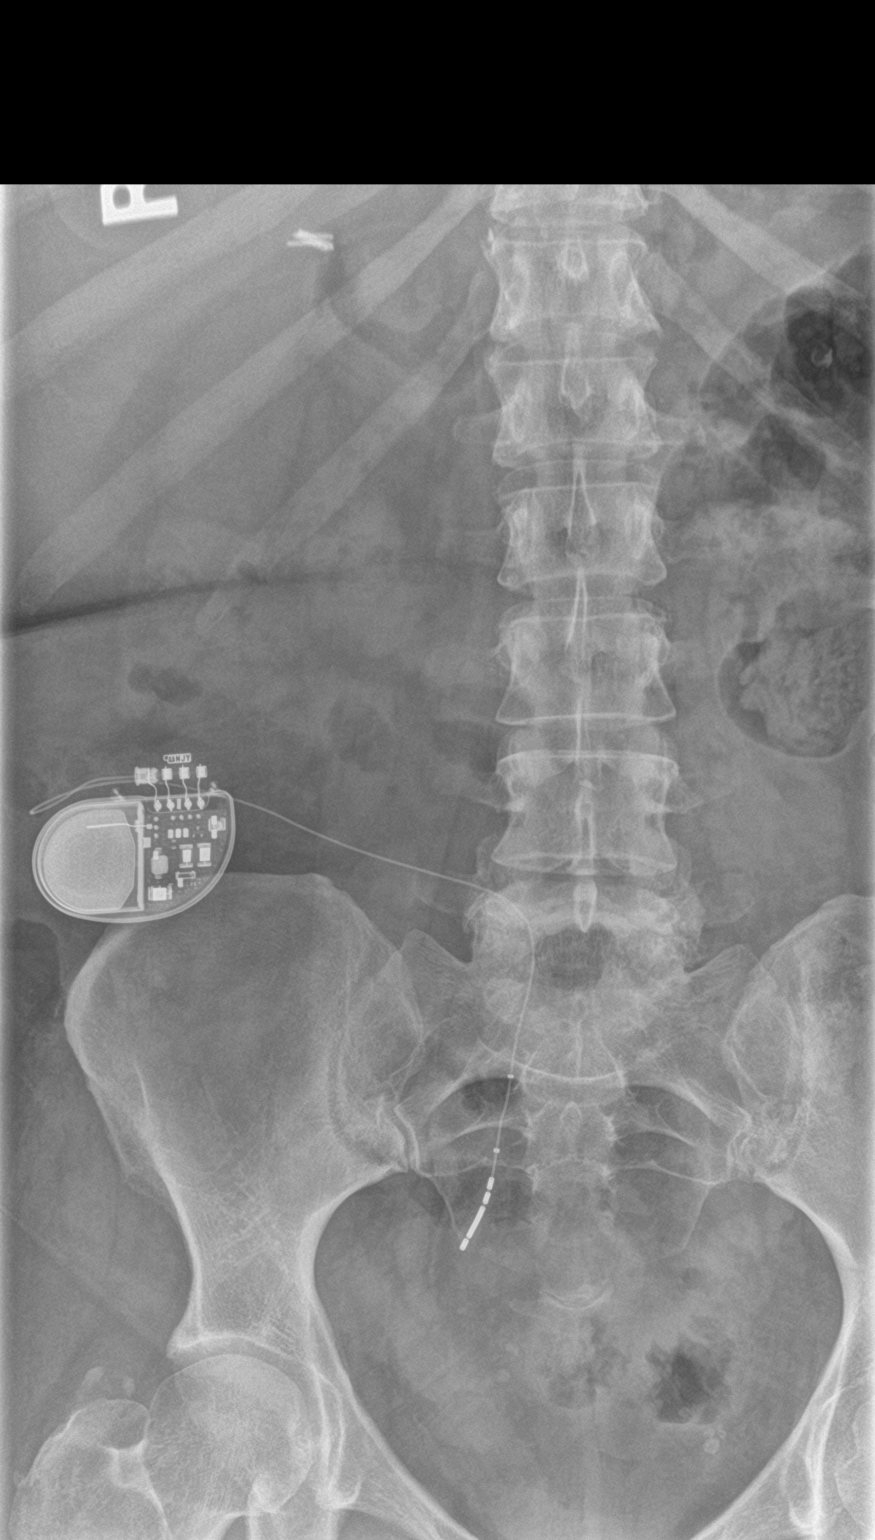
[im 2/3]
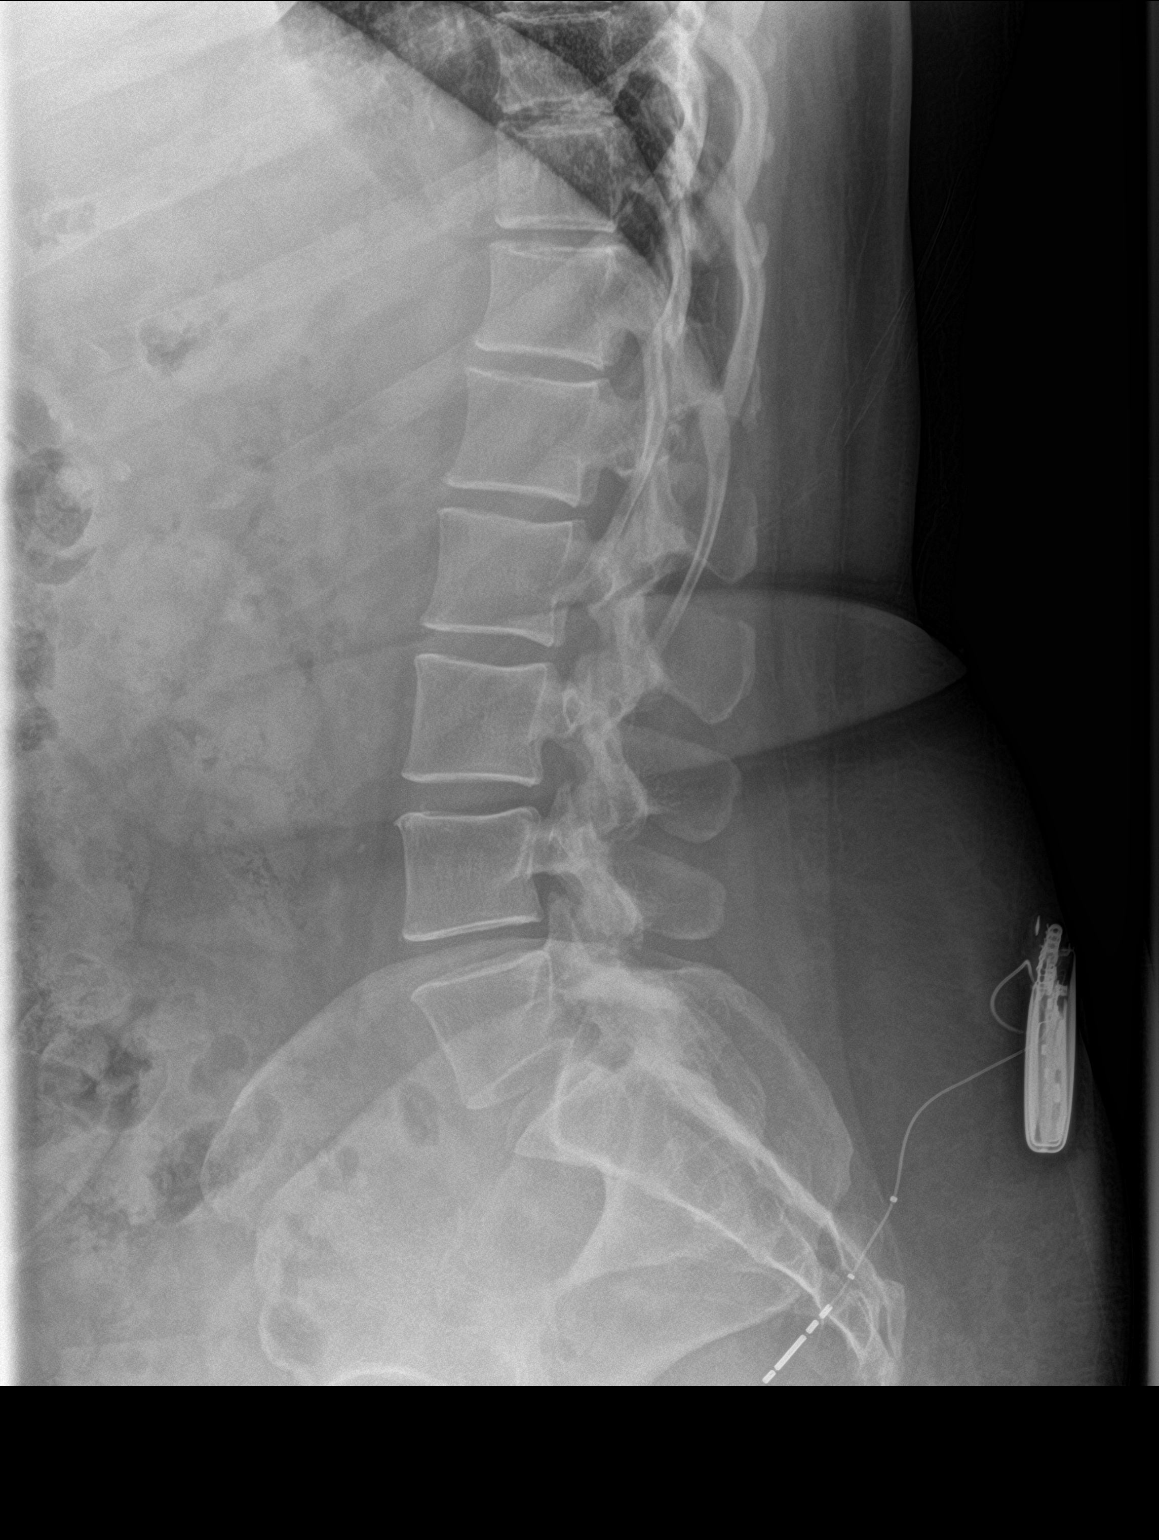
[im 3/3]
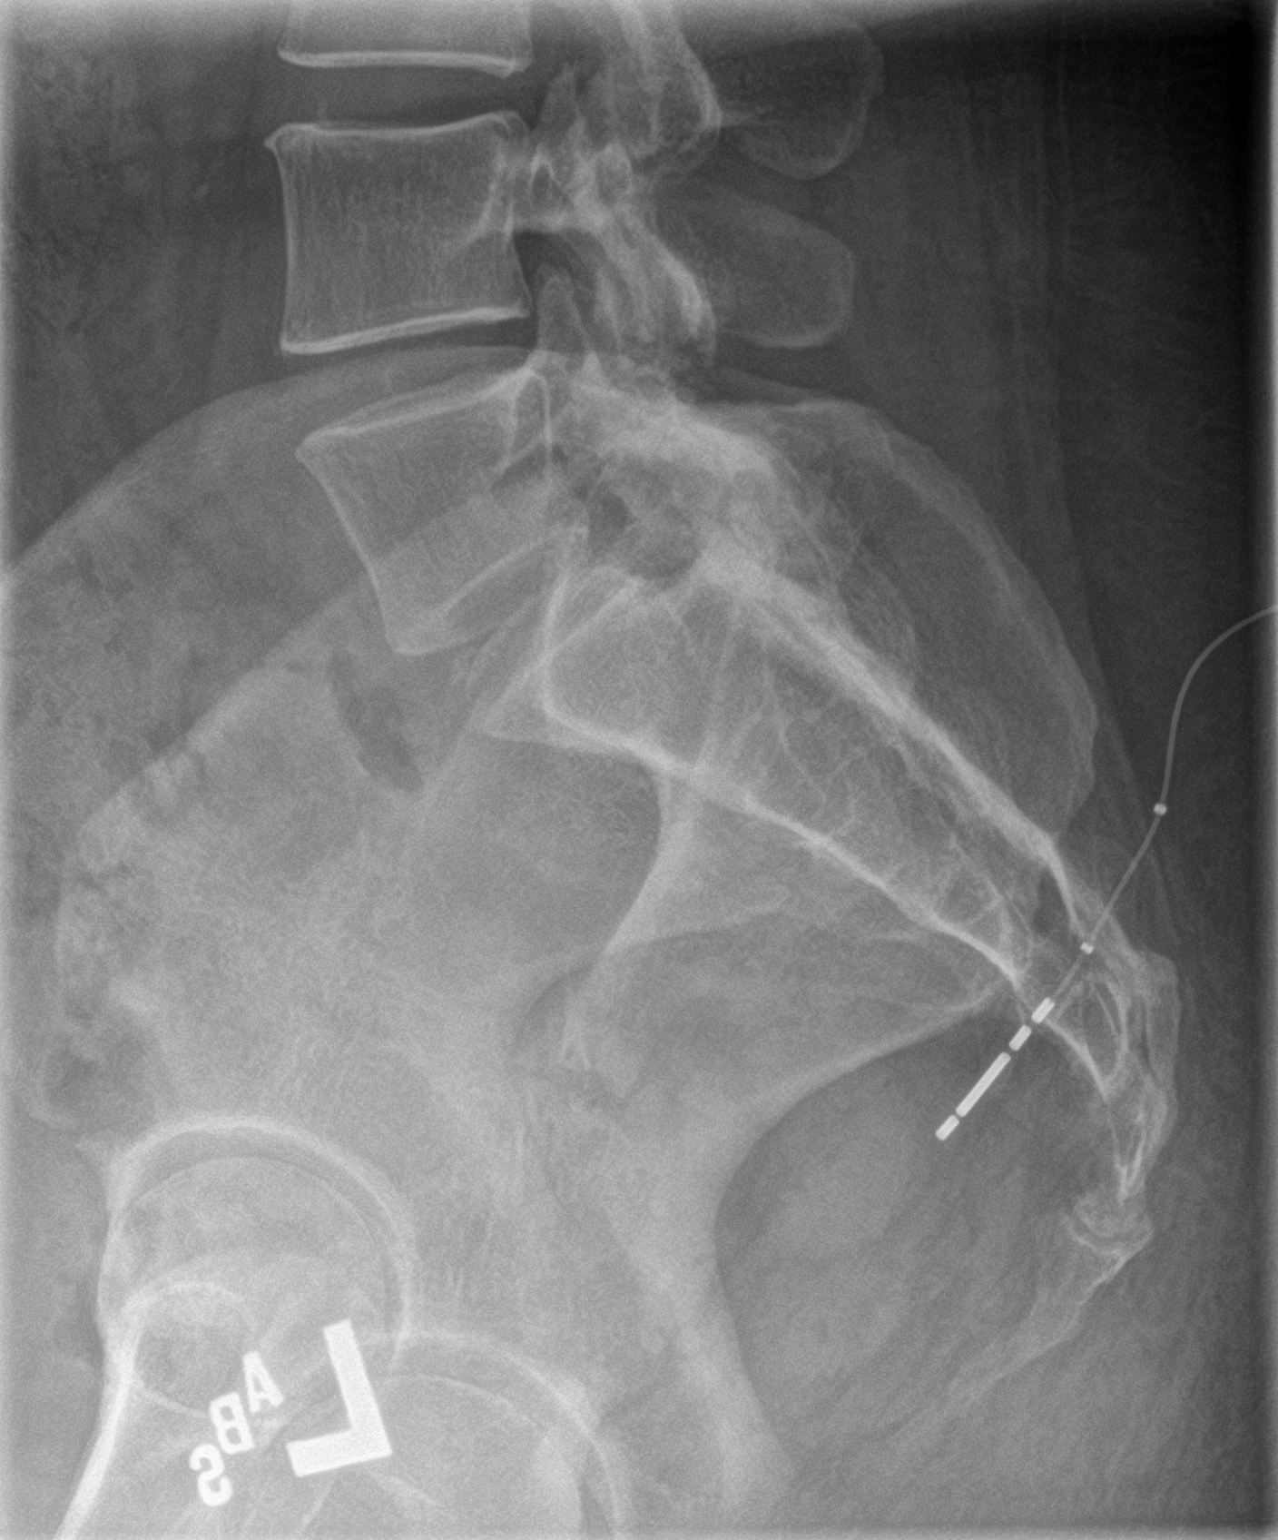

[3 of 3 positions shown; findings below may reference images not displayed]

FINDINGS: Surgical clips right upper quadrant. Neurostimulator noted in stable
position. No acute bony abnormality.
IMPRESSION: Neurostimulator noted in stable position. No acute bony abnormality.

## 2018-08-24 ENCOUNTER — Encounter
Admission: RE | Admit: 2018-08-24 | Discharge: 2018-08-24 | Disposition: A | Discharge status: Home / Self Care | Payer: Medicare HMO | Source: Ambulatory Visit | Attending: Gastroenterology | Admitting: Gastroenterology

## 2018-08-24 DIAGNOSIS — R112 Nausea with vomiting, unspecified: Secondary | ICD-10-CM

## 2018-08-24 MED ORDER — TECHNETIUM TC 99M SULFUR COLLOID
2.4000 | Freq: Once | INTRAVENOUS | Status: AC | PRN
  Administered 2018-08-24: 2.4 via ORAL

## 2018-08-26 LAB — HEMOGLOBIN A1C: HEMOGLOBIN A1C: 8.3

## 2018-08-29 ENCOUNTER — Other Ambulatory Visit: Payer: Self-pay

## 2018-08-29 MED ORDER — AMLODIPINE BESYLATE 2.5 MG PO TABS: 2.5000 mg | ORAL_TABLET | Freq: Every day | ORAL | 1 refills | Status: DC

## 2018-08-29 MED ORDER — LISINOPRIL 2.5 MG PO TABS: 2.5000 mg | ORAL_TABLET | Freq: Every day | ORAL | 1 refills | Status: DC

## 2018-08-30 ENCOUNTER — Telehealth: Payer: Self-pay

## 2018-08-30 NOTE — Telephone Encounter (Signed)
Pt notified of gastric emptying study results.

## 2018-08-30 NOTE — Telephone Encounter (Signed)
-----   Message from [name], MD sent at 08/25/2018  1:52 PM EST ----- Let the patient know that her gastric emptying study showed her to have a slightly delayed gastric emptying.  She should have multiple small meals a day instead of eating large meals 2-3 times a day.

## 2018-09-06 ENCOUNTER — Other Ambulatory Visit (HOSPITAL_COMMUNITY)
Admission: RE | Admit: 2018-09-06 | Discharge: 2018-09-06 | Disposition: A | Discharge status: Home / Self Care | Payer: Medicare HMO | Source: Ambulatory Visit | Attending: Family Medicine | Admitting: Family Medicine

## 2018-09-06 ENCOUNTER — Encounter: Payer: Self-pay | Admitting: Family Medicine

## 2018-09-06 ENCOUNTER — Ambulatory Visit (INDEPENDENT_AMBULATORY_CARE_PROVIDER_SITE_OTHER): Payer: Medicare HMO | Admitting: Family Medicine

## 2018-09-06 VITALS — BP 120/76 | HR 93 | Temp 98.4°F | Ht 59.0 in | Wt 187.7 lb

## 2018-09-06 DIAGNOSIS — J029 Acute pharyngitis, unspecified: Secondary | ICD-10-CM

## 2018-09-06 DIAGNOSIS — E1140 Type 2 diabetes mellitus with diabetic neuropathy, unspecified: Secondary | ICD-10-CM

## 2018-09-06 DIAGNOSIS — R52 Pain, unspecified: Secondary | ICD-10-CM

## 2018-09-06 DIAGNOSIS — IMO0002 Reserved for concepts with insufficient information to code with codable children: Secondary | ICD-10-CM

## 2018-09-06 DIAGNOSIS — H6983 Other specified disorders of Eustachian tube, bilateral: Secondary | ICD-10-CM

## 2018-09-06 DIAGNOSIS — E1165 Type 2 diabetes mellitus with hyperglycemia: Secondary | ICD-10-CM

## 2018-09-06 DIAGNOSIS — B349 Viral infection, unspecified: Secondary | ICD-10-CM | POA: Diagnosis not present

## 2018-09-06 DIAGNOSIS — R6883 Chills (without fever): Secondary | ICD-10-CM

## 2018-09-06 DIAGNOSIS — H6993 Unspecified Eustachian tube disorder, bilateral: Secondary | ICD-10-CM

## 2018-09-06 LAB — POCT INFLUENZA A/B
Influenza A, POC: NEGATIVE
Influenza B, POC: NEGATIVE

## 2018-09-06 LAB — POCT RAPID STREP A (OFFICE): Rapid Strep A Screen: NEGATIVE

## 2018-09-06 NOTE — Assessment & Plan Note (Signed)
Patient will bring by her A1c for CMA to abstract into chart; managed by Dr. Ronnald Collum; foot exam by MD today

## 2018-09-06 NOTE — Progress Notes (Signed)
BP 120/76   Pulse 93   Temp 98.4 F (36.9 C) (Oral)   Ht 4' 11"  (1.499 m)   Wt 187 lb 11.2 oz (85.1 kg)   SpO2 95%   BMI 37.91 kg/m    Subjective:    Patient ID: [name], female    DOB: [birth year], 53 y.o.   MRN: [id number]  HPI: [name] is a 53 y.o. female  Chief Complaint  Patient presents with  . Ear Pain    onset 2 days bilateral  . URI    onset 2days cough, sore throat, runny nose, body aches and chills    HPI Patient is here for an acute visit She has been bothered with symptoms of an upper respiratory infection Cough, sore throat, runny nose, body aches, and chills She has also had ear pain x 2 days She had strep throat on August 08, 2018 She was treated for another infection in mid-December with doxycycline No fever Granddaughter was also sick She goes to endocrinologist for diabetes Lab Results  Component Value Date   HGBA1C 8.9 11/10/2017  Obesity; she is going back up; probably the holidays and being sick; she does not want to see nutritionist again; not drinking enough water   Depression screen [facility] 2/9 09/06/2018 08/15/2018 08/08/2018 07/18/2018 04/16/2018  Decreased Interest 0 0 0 0 0  Down, Depressed, Hopeless 0 0 0 0 0  PHQ - 2 Score 0 0 0 0 0  Altered sleeping 0 0 0 0 -  Tired, decreased energy 0 0 0 0 -  Change in appetite 0 0 0 0 -  Feeling bad or failure about yourself  0 0 0 0 -  Trouble concentrating 0 0 0 0 -  Moving slowly or fidgety/restless 0 0 0 0 -  Suicidal thoughts 0 0 0 0 -  PHQ-9 Score 0 0 0 0 -  Difficult doing work/chores Not difficult at all Not difficult at all Not difficult at all Not difficult at all -  Some recent data might be hidden   Fall Risk  09/06/2018 08/15/2018 08/08/2018 07/22/2018 07/18/2018  Falls in the past year? 0 0 0 0 1  Comment - - - - -  Number falls in past yr: 0 0 - 0 1  Comment - - - - -  Injury with Fall? 0 0 - - 0  Comment - - - - -  Risk for fall due to : - - - - Impaired  balance/gait;Medication side effect  Follow up - - - - -    Relevant past medical, surgical, family and social history reviewed Past Medical History:  Diagnosis Date  . Anxiety   . Arthritis   . Asthma   . Bipolar depression ([place])   . CHF (congestive heart failure) ([place])   . Chronic headaches    migraines have lessened  . Chronic kidney disease    ddstage 111  . COPD (chronic obstructive pulmonary disease) ([place])   . Coronary artery disease   . Depression   . Diabetic neuropathy ([place])   . Dyspnea    uses inhalers for this  . Dysrhythmia    rapid  . Fatty liver   . GERD (gastroesophageal reflux disease)   . Headache   . Heart attack ([place]) 2009  . Hyperlipidemia   . Hypertension   . Hypomagnesemia   . Hypothyroid   . Irregular heart rhythm   . Liver lesion    Favored to be  benign on CT; MRI of abd w/contract in Aug 2016. Liver Biopsy-Negative, March 2016  . Morbid obesity ([place])   . OSA (obstructive sleep apnea)   . Peripheral vascular disease ([place])   . Pinched nerve    Back  . Pneumonia   . RLS (restless legs syndrome)   . Seasonal allergies   . Sleep apnea    uses oxygen at night 2L d/t cpap did not fit  . Type 2 diabetes, uncontrolled, with neuropathy ([place]) 10/15/2015   Overview:  Microalbumin 7.7 12/2010: Hgb A1c 10.2% 12/2010 and 9.7% 03/2011, eye exam 10/2009 [facility]   . Urinary incontinence   . Vitamin D deficiency    Past Surgical History:  Procedure Laterality Date  . ABDOMINAL HYSTERECTOMY    . ABDOMINAL HYSTERECTOMY     Partial  . bladder stim  2007  . BLADDER SURGERY     x 2. tacking of bladder and the other was the stimulator placement  . CHOLECYSTECTOMY    . INTERSTIM-GENERATOR CHANGE N/A 12/31/2017   Procedure: INTERSTIM-GENERATOR CHANGE;  Surgeon: Bjorn Loser, MD;  Location: [facility] ORS;  Service: Urology;  Laterality: N/A;  . LIVER BIOPSY  March 2016   Negative  . LUNG BIOPSY  2015   small spot on lung  . TUBAL LIGATION     Family  History  Problem Relation Age of Onset  . Diabetes Mother   . Heart disease Mother   . Hyperlipidemia Mother   . Hypertension Mother   . Kidney disease Mother   . Mental illness Mother   . Hypothyroidism Mother   . Stroke Mother   . Osteoporosis Mother   . Glaucoma Mother   . Congestive Heart Failure Mother   . Hypertension Father   . Asthma Daughter   . Cancer Daughter        throat  . Bipolar disorder Daughter   . Heart disease Maternal Grandfather   . Rheum arthritis Maternal Grandfather   . Cancer Maternal Grandfather        liver  . Kidney disease Maternal Grandfather   . Cancer Paternal Grandmother        lung  . Kidney disease Brother   . Breast cancer Maternal Grandmother 60  . Bipolar disorder Daughter   . Hypertension Daughter   . Restless legs syndrome Daughter    Social History   Tobacco Use  . Smoking status: Never Smoker  . Smokeless tobacco: Never Used  Substance Use Topics  . Alcohol use: No  . Drug use: No     Office Visit from 09/06/2018 in [facility]  AUDIT-C Score  0      Interim medical history since last visit reviewed. Allergies and medications reviewed  Review of Systems Per HPI unless specifically indicated above     Objective:    BP 120/76   Pulse 93   Temp 98.4 F (36.9 C) (Oral)   Ht 4' 11"  (1.499 m)   Wt 187 lb 11.2 oz (85.1 kg)   SpO2 95%   BMI 37.91 kg/m   Wt Readings from Last 3 Encounters:  09/06/18 187 lb 11.2 oz (85.1 kg)  08/15/18 186 lb (84.4 kg)  08/08/18 189 lb 6.4 oz (85.9 kg)    Physical Exam Constitutional:      General: She is not in acute distress.    Appearance: She is well-developed. She is obese.  HENT:     Right Ear: Ear canal normal. A middle ear  effusion is present. Tympanic membrane is not erythematous, retracted or bulging.     Left Ear: Ear canal normal. A middle ear effusion is present. Tympanic membrane is not erythematous, retracted or bulging.     Mouth/Throat:      Pharynx: No oropharyngeal exudate or posterior oropharyngeal erythema.  Eyes:     General: No scleral icterus. Cardiovascular:     Rate and Rhythm: Normal rate and regular rhythm.  Pulmonary:     Effort: Pulmonary effort is normal.     Breath sounds: Normal breath sounds.  Lymphadenopathy:     Cervical: No cervical adenopathy.  Neurological:     Mental Status: She is alert.  Psychiatric:        Behavior: Behavior normal.     Diabetic Foot Form - Detailed   Diabetic Foot Exam - detailed Diabetic Foot exam was performed with the following findings:  Yes 09/06/2018  9:57 AM  Visual Foot Exam completed.:  Yes  Pulse Foot Exam completed.:  Yes  Right Dorsalis Pedis:  Present Left Dorsalis Pedis:  Present  Sensory Foot Exam Completed.:  Yes Semmes-Weinstein Monofilament Test R Site 1-Great Toe:  Pos L Site 1-Great Toe:  Pos         Results for orders placed or performed in visit on 09/06/18  POCT Influenza A/B  Result Value Ref Range   Influenza A, POC Negative Negative   Influenza B, POC Negative Negative  POCT rapid strep A  Result Value Ref Range   Rapid Strep A Screen Negative Negative      Assessment & Plan:   Problem List Items Addressed This Visit      Endocrine   Type 2 diabetes, uncontrolled, with neuropathy ([place])    Patient will bring by her A1c for CMA to abstract into chart; managed by Dr. Ronnald Collum; foot exam by MD today        Other   Morbid obesity ([place])    BMI >35 with diabetes, dyslipidemia; encouraged weight loss; she declined offer to see nutritionist       Other Visit Diagnoses    Viral infection    -  Primary   explained she is contagious; frequent infections; will see her back in 4 weeks for testing; vit C, rest, hydration, etc.; to urgent care if worse   Eustachian tube dysfunction, bilateral       already using nasal corticosteroid; recommended Afrin for 3 days and 3 days ONLY; do not re-use for 2 weeks; do not sniff, gently blow nasal  secretions   Body aches       flu tests negative; likely viral syndrome; rest, hydration   Relevant Orders   POCT Influenza A/B (Completed)   Chills       flu tests negative; likely viral syndrome; rest, hydration   Relevant Orders   POCT Influenza A/B (Completed)   Sore throat       rapid strep negative; culture pending; rest, hydration   Relevant Orders   POCT rapid strep A (Completed)   Culture, Group A Strep       Follow up plan: Return in about 4 weeks (around 10/04/2018) for follow-up visit with [name].  An after-visit summary was printed and given to the patient at [facility] [place].  Please see the patient instructions which may contain other information and recommendations beyond what is mentioned above in the assessment and plan.  No orders of the defined types were placed in this encounter.   Orders Placed This  Encounter  Procedures  . Culture, Group A Strep  . POCT Influenza A/B  . POCT rapid strep A

## 2018-09-06 NOTE — Patient Instructions (Addendum)
Try vitamin C (orange juice if not diabetic or vitamin C tablets) and drink green tea to help your immune system during your illness Get plenty of rest and hydration  You are contagious so avoid crowds and elderly people and young children especially  You can use AFRIN for three days and three days only; after three days, do NOT use any more for at least two weeks; if you use again in the future, only use for three days at a time; your body can get addicted to this medicine  Return in one month for a work-up of your immune system  Please bring your labs from Dr. Ronnald Collum so we can update your A1c in our chart   Viral Respiratory Infection A viral respiratory infection is an illness that affects parts of the body that are used for breathing. These include the lungs, nose, and throat. It is caused by a germ called a virus. Some examples of this kind of infection are:  A cold.  The flu (influenza).  A respiratory syncytial virus (RSV) infection. A person who gets this illness may have the following symptoms:  A stuffy or runny nose.  Yellow or green fluid in the nose.  A cough.  Sneezing.  Tiredness (fatigue).  Achy muscles.  A sore throat.  Sweating or chills.  A fever.  A headache. Follow these instructions at home: Managing pain and congestion  Take over-the-counter and prescription medicines only as told by your doctor.  If you have a sore throat, gargle with salt water. Do this 3-4 times per day or as needed. To make a salt-water mixture, dissolve -1 tsp of salt in 1 cup of warm water. Make sure that all the salt dissolves.  Use nose drops made from salt water. This helps with stuffiness (congestion). It also helps soften the skin around your nose.  Drink enough fluid to keep your pee (urine) pale yellow. General instructions   Rest as much as possible.  Do not drink alcohol.  Do not use any products that have nicotine or tobacco, such as cigarettes and  e-cigarettes. If you need help quitting, ask your doctor.  Keep all follow-up visits as told by your doctor. This is important. How is this prevented?   Get a flu shot every year. Ask your doctor when you should get your flu shot.  Do not let other people get your germs. If you are sick: ? Stay home from work or school. ? Wash your hands with soap and water often. Wash your hands after you cough or sneeze. If soap and water are not available, use hand sanitizer.  Avoid contact with people who are sick during cold and flu season. This is in fall and winter. Get help if:  Your symptoms last for 10 days or longer.  Your symptoms get worse over time.  You have a fever.  You have very bad pain in your face or forehead.  Parts of your jaw or neck become very swollen. Get help right away if:  You feel pain or pressure in your chest.  You have shortness of breath.  You faint or feel like you will faint.  You keep throwing up (vomiting).  You feel confused. Summary  A viral respiratory infection is an illness that affects parts of the body that are used for breathing.  Examples of this illness include a cold, the flu, and respiratory syncytial virus (RSV) infection.  The infection can cause a runny nose, cough, sneezing, sore  throat, and fever.  Follow what your doctor tells you about taking medicines, drinking lots of fluid, washing your hands, resting at home, and avoiding people who are sick. This information is not intended to replace advice given to you by your health care provider. Make sure you discuss any questions you have with your health care provider. Document Released: 08/03/2008 Document Revised: 10/01/2017 Document Reviewed: 10/01/2017 Elsevier Interactive Patient Education  2019 Elsevier Inc.  Obesity, Adult Obesity is the condition of having too much total body fat. Being overweight or obese means that your weight is greater than what is considered healthy  for your body size. Obesity is determined by a measurement called BMI. BMI is an estimate of body fat and is calculated from height and weight. For adults, a BMI of 30 or higher is considered obese. Obesity can eventually lead to other health concerns and major illnesses, including:  Stroke.  Coronary artery disease (CAD).  Type 2 diabetes.  Some types of cancer, including cancers of the colon, breast, uterus, and gallbladder.  Osteoarthritis.  High blood pressure (hypertension).  High cholesterol.  Sleep apnea.  Gallbladder stones.  Infertility problems. What are the causes? The main cause of obesity is taking in (consuming) more calories than your body uses for energy. Other factors that contribute to this condition may include:  Being born with genes that make you more likely to become obese.  Having a medical condition that causes obesity. These conditions include: ? Hypothyroidism. ? Polycystic ovarian syndrome (PCOS). ? Binge-eating disorder. ? Cushing syndrome.  Taking certain medicines, such as steroids, antidepressants, and seizure medicines.  Not being physically active (sedentary lifestyle).  Living where there are limited places to exercise safely or buy healthy foods.  Not getting enough sleep. What increases the risk? The following factors may increase your risk of this condition:  Having a family history of obesity.  Being a woman of African-American descent.  Being a man of Hispanic descent. What are the signs or symptoms? Having excessive body fat is the main symptom of this condition. How is this diagnosed? This condition may be diagnosed based on:  Your symptoms.  Your medical history.  A physical exam. Your health care provider may measure: ? Your BMI. If you are an adult with a BMI between 25 and less than 30, you are considered overweight. If you are an adult with a BMI of 30 or higher, you are considered obese. ? The distances around  your hips and your waist (circumferences). These may be compared to each other to help diagnose your condition. ? Your skinfold thickness. Your health care provider may gently pinch a fold of your skin and measure it. How is this treated? Treatment for this condition often includes changing your lifestyle. Treatment may include some or all of the following:  Dietary changes. Work with your health care provider and a dietitian to set a weight-loss goal that is healthy and reasonable for you. Dietary changes may include eating: ? Smaller portions. A portion size is the amount of a particular food that is healthy for you to eat at one time. This varies from person to person. ? Low-calorie or low-fat options. ? More whole grains, fruits, and vegetables.  Regular physical activity. This may include aerobic activity (cardio) and strength training.  Medicine to help you lose weight. Your health care provider may prescribe medicine if you are unable to lose 1 pound a week after 6 weeks of eating more healthily and doing more  physical activity.  Surgery. Surgical options may include gastric banding and gastric bypass. Surgery may be done if: ? Other treatments have not helped to improve your condition. ? You have a BMI of 40 or higher. ? You have life-threatening health problems related to obesity. Follow these instructions at home:  Eating and drinking   Follow recommendations from your health care provider about what you eat and drink. Your health care provider may advise you to: ? Limit fast foods, sweets, and processed snack foods. ? Choose low-fat options, such as low-fat milk instead of whole milk. ? Eat 5 or more servings of fruits or vegetables every day. ? Eat at home more often. This gives you more control over what you eat. ? Choose healthy foods when you eat out. ? Learn what a healthy portion size is. ? Keep low-fat snacks on hand. ? Avoid sugary drinks, such as soda, fruit juice,  iced tea sweetened with sugar, and flavored milk. ? Eat a healthy breakfast.  Drink enough water to keep your urine clear or pale yellow.  Do not go without eating for long periods of time (do not fast) or follow a fad diet. Fasting and fad diets can be unhealthy and even dangerous. Physical Activity  Exercise regularly, as told by your health care provider. Ask your health care provider what types of exercise are safe for you and how often you should exercise.  Warm up and stretch before being active.  Cool down and stretch after being active.  Rest between periods of activity. Lifestyle  Limit the time that you spend in front of your TV, computer, or video game system.  Find ways to reward yourself that do not involve food.  Limit alcohol intake to no more than 1 drink a day for nonpregnant women and 2 drinks a day for men. One drink equals 12 oz of beer, 5 oz of wine, or 1 oz of hard liquor. General instructions  Keep a weight loss journal to keep track of the food you eat and how much you exercise you get.  Take over-the-counter and prescription medicines only as told by your health care provider.  Take vitamins and supplements only as told by your health care provider.  Consider joining a support group. Your health care provider may be able to recommend a support group.  Keep all follow-up visits as told by your health care provider. This is important. Contact a health care provider if:  You are unable to meet your weight loss goal after 6 weeks of dietary and lifestyle changes. This information is not intended to replace advice given to you by your health care provider. Make sure you discuss any questions you have with your health care provider. Document Released: 09/28/2004 Document Revised: 01/24/2016 Document Reviewed: 06/09/2015 Elsevier Interactive Patient Education  2019 Elsevier Inc.  Preventing Unhealthy [employer], Adult Staying at a healthy weight is  important to your overall health. When fat builds up in your body, you may become overweight or obese. Being overweight or obese increases your risk of developing certain health problems, such as heart disease, diabetes, sleeping problems, joint problems, and some types of cancer. Unhealthy weight gain is often the result of making unhealthy food choices or not getting enough exercise. You can make changes to your lifestyle to prevent obesity and stay as healthy as possible. What nutrition changes can be made?   Eat only as much as your body needs. To do this: ? Pay attention to signs  that you are hungry or full. Stop eating as soon as you feel full. ? If you feel hungry, try drinking water first before eating. Drink enough water so your urine is clear or pale yellow. ? Eat smaller portions. Pay attention to portion sizes when eating out. ? Look at serving sizes on food labels. Most foods contain more than one serving per container. ? Eat the recommended number of calories for your gender and activity level. For most active people, a daily total of 2,000 calories is appropriate. If you are trying to lose weight or are not very active, you may need to eat fewer calories. Talk with your health care provider or a diet and nutrition specialist (dietitian) about how many calories you need each day.  Choose healthy foods, such as: ? Fruits and vegetables. At each meal, try to fill at least half of your plate with fruits and vegetables. ? Whole grains, such as whole-wheat bread, brown rice, and quinoa. ? Lean meats, such as chicken or fish. ? Other healthy proteins, such as beans, eggs, or tofu. ? Healthy fats, such as nuts, seeds, fatty fish, and olive oil. ? Low-fat or fat-free dairy products.  Check food labels, and avoid food and drinks that: ? Are high in calories. ? Have added sugar. ? Are high in sodium. ? Have saturated fats or trans fats.  Cook foods in healthier ways, such as by baking,  broiling, or grilling.  Make a meal plan for the week, and shop with a grocery list to help you stay on track with your purchases. Try to avoid going to the grocery store when you are hungry.  When grocery shopping, try to shop around the outside of the store first, where the fresh foods are. Doing this helps you to avoid prepackaged foods, which can be high in sugar, salt (sodium), and fat. What lifestyle changes can be made?   Exercise for 30 or more minutes on 5 or more days each week. Exercising may include brisk walking, yard work, biking, running, swimming, and team sports like basketball and soccer. Ask your health care provider which exercises are safe for you.  Do muscle-strengthening activities, such as lifting weights or using resistance bands, on 2 or more days a week.  Do not use any products that contain nicotine or tobacco, such as cigarettes and e-cigarettes. If you need help quitting, ask your health care provider.  Limit alcohol intake to no more than 1 drink a day for nonpregnant women and 2 drinks a day for men. One drink equals 12 oz of beer, 5 oz of wine, or 1 oz of hard liquor.  Try to get 7-9 hours of sleep each night. What other changes can be made?  Keep a food and activity journal to keep track of: ? What you ate and how many calories you had. Remember to count the calories in sauces, dressings, and side dishes. ? Whether you were active, and what exercises you did. ? Your calorie, weight, and activity goals.  Check your weight regularly. Track any changes. If you notice you have gained weight, make changes to your diet or activity routine.  Avoid taking weight-loss medicines or supplements. Talk to your health care provider before starting any new medicine or supplement.  Talk to your health care provider before trying any new diet or exercise plan. Why are these changes important? Eating healthy, staying active, and having healthy habits can help you to  prevent obesity. Those changes also:  Help you manage stress and emotions.  Help you connect with friends and family.  Improve your self-esteem.  Improve your sleep.  Prevent long-term health problems. What can happen if changes are not made? Being obese or overweight can cause you to develop joint or bone problems, which can make it hard for you to stay active or do activities you enjoy. Being obese or overweight also puts stress on your heart and lungs and can lead to health problems like diabetes, heart disease, and some cancers. Where to find more information Talk with your health care provider or a dietitian about healthy eating and healthy lifestyle choices. You may also find information from:  U.[place]. Department of Agriculture, MyPlate: FormerBoss.no  American Heart Association: www.heart.org  Centers for Disease Control and Prevention: http://www.wolf.info/ Summary  Staying at a healthy weight is important to your overall health. It helps you to prevent certain diseases and health problems, such as heart disease, diabetes, joint problems, sleep disorders, and some types of cancer.  Being obese or overweight can cause you to develop joint or bone problems, which can make it hard for you to stay active or do activities you enjoy.  You can prevent unhealthy weight gain by eating a healthy diet, exercising regularly, not smoking, limiting alcohol, and getting enough sleep.  Talk with your health care provider or a dietitian for guidance about healthy eating and healthy lifestyle choices. This information is not intended to replace advice given to you by your health care provider. Make sure you discuss any questions you have with your health care provider. Document Released: 08/22/2016 Document Revised: 06/01/2017 Document Reviewed: 09/27/2016 Elsevier Interactive Patient Education  2019 [employer].

## 2018-09-06 NOTE — Assessment & Plan Note (Signed)
BMI >35 with diabetes, dyslipidemia; encouraged weight loss; she declined offer to see nutritionist

## 2018-09-08 LAB — CULTURE, GROUP A STREP (THRC)

## 2018-09-13 ENCOUNTER — Encounter: Payer: Self-pay | Admitting: Family Medicine

## 2018-09-13 ENCOUNTER — Ambulatory Visit (INDEPENDENT_AMBULATORY_CARE_PROVIDER_SITE_OTHER): Payer: Medicare HMO | Admitting: Family Medicine

## 2018-09-13 VITALS — BP 124/70 | HR 87 | Temp 98.3°F | Resp 16 | Ht 59.0 in | Wt 185.0 lb

## 2018-09-13 DIAGNOSIS — H66002 Acute suppurative otitis media without spontaneous rupture of ear drum, left ear: Secondary | ICD-10-CM | POA: Diagnosis not present

## 2018-09-13 DIAGNOSIS — J0141 Acute recurrent pansinusitis: Secondary | ICD-10-CM

## 2018-09-13 DIAGNOSIS — E538 Deficiency of other specified B group vitamins: Secondary | ICD-10-CM | POA: Diagnosis not present

## 2018-09-13 MED ORDER — CLINDAMYCIN HCL 300 MG PO CAPS: 300.0000 mg | ORAL_CAPSULE | Freq: Three times a day (TID) | ORAL | 0 refills | Status: AC

## 2018-09-13 MED ORDER — CYANOCOBALAMIN 1000 MCG/ML IJ SOLN
1000.0000 ug | Freq: Once | INTRAMUSCULAR | Status: AC
  Administered 2018-09-13: 1000 ug via INTRAMUSCULAR

## 2018-09-13 NOTE — Addendum Note (Signed)
Addended by: Reynold Mantell, Ulla Potash on: 09/13/2018 10:32 AM   Modules accepted: Orders

## 2018-09-13 NOTE — Progress Notes (Signed)
Name: BLIA TOTMAN   MRN: [id number]    DOB: 1966/07/02   Date:09/13/2018       Progress Note  Subjective  Chief Complaint  Chief Complaint  Patient presents with  . Otalgia    hard to hear, bilateral pain  . URI    headache, nasal presuure, nose sores    HPI  PT presents with concern for ongoing URI with LEFT otalgia and loss of hearing for >7 days.  She was seen by [name] on 09/06/18 for viral URI with eustachian tube dysfunction - pt states otalgia is much worse now, and sinus pain is much worse as well.  She was given doxycycline on 07/04/2018, 08/15/2018 for sinusitis. She was also given Azithromycin 08/08/2018 for sore throat.  She does not see ENT right now, but did discuss that this is the next step due to recurrent infections.  She denies fevers or chills, no vomiting or diarrhea, no vision changes.   B12 shot - due today for this.  Patient Active Problem List   Diagnosis Date Noted  . Vitamin B12 deficiency 08/08/2018  . Encounter for Medicare annual wellness exam 10/30/2017  . Patient is full code 10/30/2017  . Disorder of bursae of shoulder region 10/09/2017  . Coronary artery disease 10/09/2017  . Plantar fascial fibromatosis 10/09/2017  . Tendinitis of wrist 10/09/2017  . Schizoaffective disorder, bipolar type ([place]) 09/28/2017  . Chest pain 09/10/2017  . Lobar pneumonia ([place]) 06/05/2017  . Multifocal pneumonia 05/27/2017  . Pleuritic chest pain 05/27/2017  . Hypertriglyceridemia 12/21/2016  . Pansinusitis 11/15/2016  . 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 Breast cancer screening 09/28/2016  . Urine frequency 09/28/2016  . Preventative health care 09/28/2016  . Fatigue 05/01/2016  . Urinary tract infection 03/14/2016  . Adenomatous colon polyp 10/15/2015  . Airway hyperreactivity 10/15/2015  . Bipolar affective disorder ([place]) 10/15/2015  . CN (constipation) 10/15/2015  . CAFL (chronic airflow limitation) ([place]) 10/15/2015  .  Drug-induced hepatic toxicity 10/15/2015  . Blood in feces 10/15/2015  . HLD (hyperlipidemia) 10/15/2015  . Hypoxemia 10/15/2015  . NASH (nonalcoholic steatohepatitis) 10/15/2015  . Morbid obesity ([place]) 10/15/2015  . Restless leg syndrome 10/15/2015  . Type 2 diabetes, uncontrolled, with neuropathy ([place]) 10/15/2015  . Elevated alkaline phosphatase level 09/22/2015  . Urinary bladder incontinence 09/22/2015  . Low back pain with left-sided sciatica 08/18/2015  . Encounter for monitoring tricyclic antidepressant therapy 05/13/2015  . Bipolar disorder ([place]) 05/11/2015  . Liver disease 05/11/2015  . Apnea, sleep 05/05/2015  . Heart valve disease 05/05/2015  . Chronic kidney disease, stage III (moderate) ([facility]) 03/15/2015  . Migraine 03/12/2015  . COPD (chronic obstructive pulmonary disease) ([place]) 01/20/2015  . 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[place]) 01/20/2015  . GERD (gastroesophageal reflux disease) 01/20/2015  . Hypothyroid   . Vitamin D deficiency   . Urinary incontinence   . Liver lesion   . Hypomagnesemia   . Obstructive sleep apnea 10/20/2013  . Vaginitis 12/19/2011  . PERIPHERAL NEUROPATHY 03/04/2009  . HEMORRHOIDS, EXTERNAL 09/03/2008  . ALLERGIC RHINITIS 07/04/2007  . ASTHMA, PERSISTENT, MODERATE 07/04/2007  . INTERSTITIAL CYSTITIS 07/04/2007  . HYPERTENSION, BENIGN ESSENTIAL 06/19/2007    Social History   Tobacco Use  . Smoking status: Never Smoker  . Smokeless tobacco: Never Used  Substance Use Topics  . Alcohol use: No     Current Outpatient Medications:  .  albuterol (PROVENTIL HFA;VENTOLIN HFA) 108 (90 Base) MCG/ACT inhaler, Inhale 2 puffs  into the lungs every 4 (four) hours as needed for wheezing or shortness of breath., Disp: , Rfl:  .  amLODipine (NORVASC) 2.5 MG tablet, Take 1 tablet (2.5 mg total) by mouth daily., Disp: 30 tablet, Rfl: 1 .  atorvastatin (LIPITOR) 80 MG tablet, Take 80 mg by mouth at bedtime. , Disp: , Rfl:  .  budesonide-formoterol  (SYMBICORT) 160-4.5 MCG/ACT inhaler, Inhale 2 puffs into the lungs 2 (two) times daily., Disp: , Rfl:  .  cholecalciferol (VITAMIN D) 1000 units tablet, Take 1,000 Units by mouth daily., Disp: , Rfl:  .  divalproex (DEPAKOTE ER) 250 MG 24 hr tablet, divalproex ER 250 mg tablet,extended release 24 hr, Disp: , Rfl:  .  DULoxetine (CYMBALTA) 60 MG capsule, Take 120 mg by mouth daily., Disp: , Rfl:  .  empagliflozin (JARDIANCE) 25 MG TABS tablet, Take 25 mg by mouth daily., Disp: , Rfl:  .  ezetimibe (ZETIA) 10 MG tablet, Take 10 mg by mouth daily. , Disp: , Rfl:  .  fenofibrate micronized (LOFIBRA) 134 MG capsule, Take 134 mg by mouth at bedtime. , Disp: , Rfl:  .  fluticasone (FLONASE) 50 MCG/ACT nasal spray, Place 2 sprays into both nostrils daily., Disp: 16 g, Rfl: 6 .  gabapentin (NEURONTIN) 300 MG capsule, Take 1 capsule (300 mg total) by mouth 2 (two) times daily., Disp: 60 capsule, Rfl: 5 .  HUMULIN R U-500 KWIKPEN 500 UNIT/ML kwikpen, Inject 60 Units as directed 3 (three) times daily. , Disp: , Rfl:  .  hydrOXYzine (VISTARIL) 50 MG capsule, Take 50-100 mg by mouth daily as needed for anxiety. , Disp: , Rfl:  .  insulin aspart (NOVOLOG) 100 UNIT/ML injection, Inject 5-25 Units into the skin 3 (three) times daily before meals. , Disp: , Rfl:  .  lamoTRIgine (LAMICTAL) 200 MG tablet, Take 200 mg by mouth 2 (two) times daily. , Disp: , Rfl:  .  levothyroxine (SYNTHROID, LEVOTHROID) 75 MCG tablet, Take 1 tablet (75 mcg total) by mouth daily., Disp: 30 tablet, Rfl: 11 .  linaclotide (LINZESS) 72 MCG capsule, Take 1 capsule (72 mcg total) by mouth daily before breakfast., Disp: 90 capsule, Rfl: 3 .  lisinopril (PRINIVIL,ZESTRIL) 2.5 MG tablet, Take 1 tablet (2.5 mg total) by mouth daily., Disp: 30 tablet, Rfl: 1 .  loratadine (CLARITIN) 10 MG tablet, Take 1 tablet (10 mg total) by mouth daily., Disp: 30 tablet, Rfl: 0 .  metoprolol tartrate (LOPRESSOR) 25 MG tablet, Take one tablet 25 mg in the am and  1/2 tab in the pm, Disp: 45 tablet, Rfl: 6 .  pantoprazole (PROTONIX) 40 MG tablet, Take 1 tablet (40 mg total) by mouth daily., Disp: 90 tablet, Rfl: 3 .  prochlorperazine (COMPAZINE) 5 MG tablet, Take 1 tablet (5 mg total) by mouth every 6 (six) hours as needed for nausea or vomiting., Disp: 12 tablet, Rfl: 0 .  QUEtiapine (SEROQUEL) 400 MG tablet, Take 400 mg by mouth at bedtime., Disp: , Rfl:  .  traZODone (DESYREL) 100 MG tablet, Take 100 mg by mouth at bedtime., Disp: , Rfl:  .  triamcinolone cream (KENALOG) 0.1 %, Apply 1 application topically 3 (three) times daily. On the abdomen to help with itching; do not use on face, Disp: 30 g, Rfl: 0 .  TRULICITY 1.5 EL/3.8BO SOPN, Inject 1.5 mLs as directed once a week. , Disp: , Rfl:  .  ULTICARE SHORT PEN NEEDLES 31G X 8 MM MISC, , Disp: , Rfl:  .  benzonatate (TESSALON PERLES) 100 MG capsule, Take 1 capsule (100 mg total) by mouth 3 (three) times daily as needed for cough. (Patient not taking: Reported on 09/06/2018), Disp: 30 capsule, Rfl: 0 .  ketorolac (TORADOL) 10 MG tablet, Take 1 tablet (10 mg total) by mouth every 6 (six) hours as needed. (Patient not taking: Reported on 08/07/2018), Disp: 20 tablet, Rfl: 0 .  ondansetron (ZOFRAN ODT) 4 MG disintegrating tablet, Take 1 tablet (4 mg total) by mouth every 8 (eight) hours as needed for nausea or vomiting. (Patient not taking: Reported on 09/06/2018), Disp: 20 tablet, Rfl: 0 .  ondansetron (ZOFRAN) 4 MG tablet, Take 1 tablet (4 mg total) by mouth every 8 (eight) hours as needed for nausea or vomiting. (Patient not taking: Reported on 08/08/2018), Disp: 20 tablet, Rfl: 0 .  Prasterone (INTRAROSA) 6.5 MG INST, Place 1 suppository vaginally daily. (Patient not taking: Reported on 08/07/2018), Disp: 30 each, Rfl: 12 .  traMADol (ULTRAM) 50 MG tablet, Take 1 tablet (50 mg total) by mouth every 6 (six) hours as needed. (Patient not taking: Reported on 08/07/2018), Disp: 20 tablet, Rfl: 0  Allergies  Allergen  Reactions  . Mirtazapine Anaphylaxis    REACTION: closes throat  . Morphine And Related     anaphylaxis  . Sulfamethoxazole-Trimethoprim Rash    REACTION: [place] all over  . Amoxicillin Er Nausea And Vomiting    Has patient had a PCN reaction causing immediate rash, facial/tongue/throat swelling, SOB or lightheadedness with hypotension: No Has patient had a PCN reaction causing severe rash involving mucus membranes or skin necrosis: No Has patient had a PCN reaction that required hospitalization: No Has patient had a PCN reaction occurring within the last 10 years: Yes If all of the above answers are "NO", then may proceed with Cephalosporin use.  . Codeine Itching    REACTION: itch, rash  . Hydrocodone-Acetaminophen Rash    REACTION: rash  . Keflex [Cephalexin] Rash  . Prasterone Nausea And Vomiting    I personally reviewed active problem list, medication list, allergies, notes from last encounter, lab results with the patient/caregiver today.  ROS  Ten systems reviewed and is negative except as mentioned in HPI  Objective  Vitals:   09/13/18 0943  BP: 124/70  Pulse: 87  Resp: 16  Temp: 98.3 F (36.8 C)  TempSrc: Oral  SpO2: 98%  Weight: 185 lb (83.9 kg)  Height: 4' 11"  (1.499 m)   Body mass index is 37.37 kg/m.  Nursing Note and Vital Signs reviewed.  Physical Exam  Constitutional: Patient appears well-developed and well-nourished. Obese No distress.  HEENT: head atraumatic, normocephalic, pupils equal and reactive to light,R TM without erythema but +Effusion, L TM with effusion, erythema, and dulled landmarks,  bilateral maxillary and frontal sinuses are tender, neck supple with shotty submandibular lymphadenopathy, throat within normal limits - no erythema or exudate, no tonsillar swelling Cardiovascular: Normal rate, regular rhythm and normal heart sounds.  No murmur heard. No BLE edema. Pulmonary/Chest: Effort normal and breath sounds clear bilaterally. No  respiratory distress. Psychiatric: Patient has a normal mood and affect. behavior is normal. Judgment and thought content normal.  No results found for this or any previous visit (from the past 72 hour(s)).  Assessment & Plan  1. Acute recurrent pansinusitis - clindamycin (CLEOCIN) 300 MG capsule; Take 1 capsule (300 mg total) by mouth 3 (three) times daily for 10 days.  Dispense: 30 capsule; Refill: 0 - Ambulatory referral to ENT  2. Non-recurrent acute  suppurative otitis media of left ear without spontaneous rupture of tympanic membrane - clindamycin (CLEOCIN) 300 MG capsule; Take 1 capsule (300 mg total) by mouth 3 (three) times daily for 10 days.  Dispense: 30 capsule; Refill: 0  -Red flags and when to present for emergency care or RTC including fever >101.35F, chest pain, shortness of breath, new/worsening/un-resolving symptoms, reviewed with patient at time of visit. Follow up and care instructions discussed and provided in AVS.

## 2018-09-13 NOTE — Patient Instructions (Signed)
Sinusitis, Adult Sinusitis is soreness and swelling (inflammation) of your sinuses. Sinuses are hollow spaces in the bones around your face. They are located:  Around your eyes.  In the middle of your forehead.  Behind your nose.  In your cheekbones. Your sinuses and nasal passages are lined with a fluid called mucus. Mucus drains out of your sinuses. Swelling can trap mucus in your sinuses. This lets germs (bacteria, virus, or fungus) grow, which leads to infection. Most of the time, this condition is caused by a virus. What are the causes? This condition is caused by:  Allergies.  Asthma.  Germs.  Things that block your nose or sinuses.  Growths in the nose (nasal polyps).  Chemicals or irritants in the air.  Fungus (rare). What increases the risk? You are more likely to develop this condition if:  You have a weak body defense system (immune system).  You do a lot of swimming or diving.  You use nasal sprays too much.  You smoke. What are the signs or symptoms? The main symptoms of this condition are pain and a feeling of pressure around the sinuses. Other symptoms include:  Stuffy nose (congestion).  Runny nose (drainage).  Swelling and warmth in the sinuses.  Headache.  Toothache.  A cough that may get worse at night.  Mucus that collects in the throat or the back of the nose (postnasal drip).  Being unable to smell and taste.  Being very tired (fatigue).  A fever.  Sore throat.  Bad breath. How is this diagnosed? This condition is diagnosed based on:  Your symptoms.  Your medical history.  A physical exam.  Tests to find out if your condition is short-term (acute) or long-term (chronic). Your doctor may: ? Check your nose for growths (polyps). ? Check your sinuses using a tool that has a light (endoscope). ? Check for allergies or germs. ? Do imaging tests, such as an MRI or CT scan. How is this treated? Treatment for this condition  depends on the cause and whether it is short-term or long-term.  If caused by a virus, your symptoms should go away on their own within 10 days. You may be given medicines to relieve symptoms. They include: ? Medicines that shrink swollen tissue in the nose. ? Medicines that treat allergies (antihistamines). ? A spray that treats swelling of the nostrils. ? Rinses that help get rid of thick mucus in your nose (nasal saline washes).  If caused by bacteria, your doctor may wait to see if you will get better without treatment. You may be given antibiotic medicine if you have: ? A very bad infection. ? A weak body defense system.  If caused by growths in the nose, you may need to have surgery. Follow these instructions at home: Medicines  Take, use, or apply over-the-counter and prescription medicines only as told by your doctor. These may include nasal sprays.  If you were prescribed an antibiotic medicine, take it as told by your doctor. Do not stop taking the antibiotic even if you start to feel better. Hydrate and humidify   Drink enough water to keep your pee (urine) pale yellow.  Use a cool mist humidifier to keep the humidity level in your home above 50%.  Breathe in steam for 10-15 minutes, 3-4 times a day, or as told by your doctor. You can do this in the bathroom while a hot shower is running.  Try not to spend time in cool or dry air.   Rest  Rest as much as you can.  Sleep with your head raised (elevated).  Make sure you get enough sleep each night. General instructions   Put a warm, moist washcloth on your face 3-4 times a day, or as often as told by your doctor. This will help with discomfort.  Wash your hands often with soap and water. If there is no soap and water, use hand sanitizer.  Do not smoke. Avoid being around people who are smoking (secondhand smoke).  Keep all follow-up visits as told by your doctor. This is important. Contact a doctor if:  You  have a fever.  Your symptoms get worse.  Your symptoms do not get better within 10 days. Get help right away if:  You have a very bad headache.  You cannot stop throwing up (vomiting).  You have very bad pain or swelling around your face or eyes.  You have trouble seeing.  You feel confused.  Your neck is stiff.  You have trouble breathing. Summary  Sinusitis is swelling of your sinuses. Sinuses are hollow spaces in the bones around your face.  This condition is caused by tissues in your nose that become inflamed or swollen. This traps germs. These can lead to infection.  If you were prescribed an antibiotic medicine, take it as told by your doctor. Do not stop taking it even if you start to feel better.  Keep all follow-up visits as told by your doctor. This is important. This information is not intended to replace advice given to you by your health care provider. Make sure you discuss any questions you have with your health care provider. Document Released: 02/07/2008 Document Revised: 01/21/2018 Document Reviewed: 01/21/2018 Elsevier Interactive Patient Education  2019 [employer]  

## 2018-09-19 ENCOUNTER — Other Ambulatory Visit: Payer: Self-pay

## 2018-09-19 MED ORDER — GABAPENTIN 300 MG PO CAPS: 300.0000 mg | ORAL_CAPSULE | Freq: Two times a day (BID) | ORAL | 0 refills | Status: DC

## 2018-09-19 NOTE — Telephone Encounter (Signed)
Pharmacy refill request for Gabapentin 345m.  Per Dr. EAmalia Hailey ok to refill once, patient needs appt.  She has not been seen in over 1 year.   Script sent to pharmacy.

## 2018-10-04 ENCOUNTER — Encounter: Payer: Self-pay | Admitting: Family Medicine

## 2018-10-04 ENCOUNTER — Ambulatory Visit (INDEPENDENT_AMBULATORY_CARE_PROVIDER_SITE_OTHER): Payer: Medicare HMO | Admitting: Family Medicine

## 2018-10-04 ENCOUNTER — Telehealth: Payer: Self-pay | Admitting: Family Medicine

## 2018-10-04 VITALS — BP 110/68 | HR 84 | Temp 98.2°F | Resp 14 | Ht 59.0 in | Wt 188.1 lb

## 2018-10-04 DIAGNOSIS — R110 Nausea: Secondary | ICD-10-CM

## 2018-10-04 DIAGNOSIS — R5383 Other fatigue: Secondary | ICD-10-CM

## 2018-10-04 DIAGNOSIS — B999 Unspecified infectious disease: Secondary | ICD-10-CM | POA: Diagnosis not present

## 2018-10-04 DIAGNOSIS — G4733 Obstructive sleep apnea (adult) (pediatric): Secondary | ICD-10-CM | POA: Diagnosis not present

## 2018-10-04 DIAGNOSIS — E559 Vitamin D deficiency, unspecified: Secondary | ICD-10-CM

## 2018-10-04 DIAGNOSIS — E1140 Type 2 diabetes mellitus with diabetic neuropathy, unspecified: Secondary | ICD-10-CM

## 2018-10-04 DIAGNOSIS — IMO0002 Reserved for concepts with insufficient information to code with codable children: Secondary | ICD-10-CM

## 2018-10-04 DIAGNOSIS — E1165 Type 2 diabetes mellitus with hyperglycemia: Secondary | ICD-10-CM

## 2018-10-04 DIAGNOSIS — D803 Selective deficiency of immunoglobulin G [IgG] subclasses: Secondary | ICD-10-CM

## 2018-10-04 DIAGNOSIS — K769 Liver disease, unspecified: Secondary | ICD-10-CM

## 2018-10-04 DIAGNOSIS — E538 Deficiency of other specified B group vitamins: Secondary | ICD-10-CM

## 2018-10-04 DIAGNOSIS — E038 Other specified hypothyroidism: Secondary | ICD-10-CM

## 2018-10-04 MED ORDER — BUDESONIDE-FORMOTEROL FUMARATE 160-4.5 MCG/ACT IN AERO: 2.0000 | INHALATION_SPRAY | Freq: Two times a day (BID) | RESPIRATORY_TRACT | 11 refills | Status: DC

## 2018-10-04 MED ORDER — ONDANSETRON HCL 4 MG PO TABS: 4.0000 mg | ORAL_TABLET | Freq: Three times a day (TID) | ORAL | 0 refills | Status: DC | PRN

## 2018-10-04 MED ORDER — ALBUTEROL SULFATE HFA 108 (90 BASE) MCG/ACT IN AERS: 2.0000 | INHALATION_SPRAY | RESPIRATORY_TRACT | 2 refills | Status: DC | PRN

## 2018-10-04 NOTE — Telephone Encounter (Signed)
Copied from [place] [id number]. Topic: Quick Communication - See Telephone Encounter >> Oct 04, 2018 11:57 AM Blase Mess A wrote: CRM for notification. See Telephone encounter for: 10/04/18.  Patient called received call from [facility]. That is too far. Requesting ENT in [place]. Please advise [phone]

## 2018-10-04 NOTE — Patient Instructions (Signed)
We'll get labs today If you have not heard anything from my staff in a week about any orders/referrals/studies from today, please contact us here to follow-up [phone]

## 2018-10-04 NOTE — Progress Notes (Signed)
BP 110/68   Pulse 84   Temp 98.2 F (36.8 C) (Oral)   Resp 14   Ht 4' 11"  (1.499 m)   Wt 188 lb 1.6 oz (85.3 kg)   SpO2 97%   BMI 37.99 kg/m    Subjective:    Patient ID: [name], female    DOB: [birth year], 53 y.o.   MRN: [id number]  HPI: [name] is a 53 y.o. female  Chief Complaint  Patient presents with  . Follow-up  . Fatigue    HPI Here for f/u  Feeling really tired; she is getting B12 shots; once a month; still tired; not sleepy kind of tired She has sleep apnea, but not wearing the machine; she uses oxygen at night and PRN during the day Dr. Humphrey Rolls might be managing sleep apnea; he is her heart doctor; she does not think this is her heart Wonders if maybe something in her blood Recurrent sinus infections; goes to see the ENT doctor on Feb 4th She saw Dr. Allen Norris; said to get Rx for zofran for her nausea; sees him for liver disease as well She has diabetes; managed by endocrinologist Vitamin D deficiency in the past, wonders if maybe causing fatigue  Depression screen [facility] 2/9 10/04/2018 09/13/2018 09/06/2018 08/15/2018 08/08/2018  Decreased Interest 0 0 0 0 0  Down, Depressed, Hopeless 0 0 0 0 0  PHQ - 2 Score 0 0 0 0 0  Altered sleeping 0 0 0 0 0  Tired, decreased energy 3 0 0 0 0  Change in appetite 0 0 0 0 0  Feeling bad or failure about yourself  0 0 0 0 0  Trouble concentrating 0 0 0 0 0  Moving slowly or fidgety/restless 0 0 0 0 0  Suicidal thoughts 0 0 0 0 0  PHQ-9 Score 3 0 0 0 0  Difficult doing work/chores Not difficult at all Not difficult at all Not difficult at all Not difficult at all Not difficult at all  Some recent data might be hidden   Fall Risk  10/04/2018 09/13/2018 09/06/2018 08/15/2018 08/08/2018  Falls in the past year? 0 0 0 0 0  Comment - - - - -  Number falls in past yr: 0 0 0 0 -  Comment - - - - -  Injury with Fall? 0 0 0 0 -  Comment - - - - -  Risk for fall due to : - - - - -  Follow up - Falls evaluation completed - - -     Relevant past medical, surgical, family and social history reviewed Past Medical History:  Diagnosis Date  . Anxiety   . Arthritis   . Asthma   . Bipolar depression ([place])   . CHF (congestive heart failure) ([place])   . Chronic headaches    migraines have lessened  . Chronic kidney disease    ddstage 111  . COPD (chronic obstructive pulmonary disease) ([place])   . Coronary artery disease   . Depression   . Diabetic neuropathy ([place])   . Dyspnea    uses inhalers for this  . Dysrhythmia    rapid  . Fatty liver   . GERD (gastroesophageal reflux disease)   . Headache   . Heart attack ([place]) 2009  . Hyperlipidemia   . Hypertension   . Hypomagnesemia   . Hypothyroid   . Irregular heart rhythm   . Liver lesion    Favored to be benign  on CT; MRI of abd w/contract in Aug 2016. Liver Biopsy-Negative, March 2016  . Morbid obesity ([place])   . OSA (obstructive sleep apnea)   . Peripheral vascular disease ([place])   . Pinched nerve    Back  . Pneumonia   . RLS (restless legs syndrome)   . Seasonal allergies   . Sleep apnea    uses oxygen at night 2L d/t cpap did not fit  . Type 2 diabetes, uncontrolled, with neuropathy ([place]) 10/15/2015   Overview:  Microalbumin 7.7 12/2010: Hgb A1c 10.2% 12/2010 and 9.7% 03/2011, eye exam 10/2009 [facility] DM clinic   . Urinary incontinence   . Vitamin D deficiency    Past Surgical History:  Procedure Laterality Date  . ABDOMINAL HYSTERECTOMY    . ABDOMINAL HYSTERECTOMY     Partial  . bladder stim  2007  . BLADDER SURGERY     x 2. tacking of bladder and the other was the stimulator placement  . CHOLECYSTECTOMY    . INTERSTIM-GENERATOR CHANGE N/A 12/31/2017   Procedure: INTERSTIM-GENERATOR CHANGE;  Surgeon: Bjorn Loser, MD;  Location: [facility] ORS;  Service: Urology;  Laterality: N/A;  . LIVER BIOPSY  March 2016   Negative  . LUNG BIOPSY  2015   small spot on lung  . TUBAL LIGATION     Family History  Problem Relation Age of Onset  . Diabetes Mother     . Heart disease Mother   . Hyperlipidemia Mother   . Hypertension Mother   . Kidney disease Mother   . Mental illness Mother   . Hypothyroidism Mother   . Stroke Mother   . Osteoporosis Mother   . Glaucoma Mother   . Congestive Heart Failure Mother   . Hypertension Father   . Asthma Daughter   . Cancer Daughter        throat  . Bipolar disorder Daughter   . Heart disease Maternal Grandfather   . Rheum arthritis Maternal Grandfather   . Cancer Maternal Grandfather        liver  . Kidney disease Maternal Grandfather   . Cancer Paternal Grandmother        lung  . Kidney disease Brother   . Breast cancer Maternal Grandmother 60  . Bipolar disorder Daughter   . Hypertension Daughter   . Restless legs syndrome Daughter    Social History   Tobacco Use  . Smoking status: Never Smoker  . Smokeless tobacco: Never Used  Substance Use Topics  . Alcohol use: No  . Drug use: No     Office Visit from 10/04/2018 in [facility]  AUDIT-C Score  0      Interim medical history since last visit reviewed. Allergies and medications reviewed  Review of Systems Per HPI unless specifically indicated above     Objective:    BP 110/68   Pulse 84   Temp 98.2 F (36.8 C) (Oral)   Resp 14   Ht 4' 11"  (1.499 m)   Wt 188 lb 1.6 oz (85.3 kg)   SpO2 97%   BMI 37.99 kg/m   Wt Readings from Last 3 Encounters:  10/04/18 188 lb 1.6 oz (85.3 kg)  09/13/18 185 lb (83.9 kg)  09/06/18 187 lb 11.2 oz (85.1 kg)    Physical Exam Constitutional:      General: She is not in acute distress.    Appearance: She is well-developed. She is obese.  Eyes:     General: No scleral icterus.  Cardiovascular:     Rate and Rhythm: Normal rate and regular rhythm.  Pulmonary:     Effort: Pulmonary effort is normal.     Breath sounds: Normal breath sounds.  Neurological:     Mental Status: She is alert.  Psychiatric:        Behavior: Behavior normal.    Diabetic Foot Form -  Detailed   Diabetic Foot Exam - detailed Diabetic Foot exam was performed with the following findings:  Yes 10/04/2018  7:43 PM  Pulse Foot Exam completed.:  Yes  Right Dorsalis Pedis:  Present Left Dorsalis Pedis:  Present  Sensory Foot Exam Completed.:  Yes Semmes-Weinstein Monofilament Test R Site 1-Great Toe:  Pos L Site 1-Great Toe:  Pos           Assessment & Plan:   Problem List Items Addressed This Visit      Respiratory   Obstructive sleep apnea   Relevant Orders   CBC with Differential/Platelet (Completed)     Digestive   Liver disease   Relevant Orders   COMPLETE METABOLIC PANEL WITH GFR (Completed)     Endocrine   Hypothyroid   Relevant Orders   TSH (Completed)   T4, free (Completed)   Type 2 diabetes, uncontrolled, with neuropathy (HCC)     Other   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HCC)   Relevant Orders   Immunoglobulins, QN, A/E/G/M   Vitamin D deficiency   Relevant Orders   VITAMIN D 25 Hydroxy (Vit-D Deficiency, Fractures) (Completed)   Fatigue   Relevant Orders   CBC with Differential/Platelet (Completed)   TSH (Completed)   T4, free (Completed)   Vitamin B12 (Completed)   VITAMIN D 25 Hydroxy (Vit-D Deficiency, Fractures) (Completed)   Urinalysis w microscopic + reflex cultur (Completed)   Vitamin B12 deficiency   Relevant Orders   Vitamin B12 (Completed)    Other Visit Diagnoses    Recurrent infections    -  Primary   Relevant Orders   Immunoglobulins, QN, A/E/G/M   HIV Antibody (routine testing w rflx) (Completed)   Nausea       Relevant Medications   ondansetron (ZOFRAN) 4 MG tablet       Follow up plan: Return in about 2 weeks (around 10/18/2018) for follow-up visit with [name].  An after-visit summary was printed and given to the patient at [place].  Please see the patient instructions which may contain other information and recommendations beyond what is mentioned above in the assessment and plan.  Meds ordered this encounter    Medications  . ondansetron (ZOFRAN) 4 MG tablet    Sig: Take 1 tablet (4 mg total) by mouth every 8 (eight) hours as needed for nausea or vomiting.    Dispense:  20 tablet    Refill:  0    NOT the sublingual dissolvable kind  . albuterol (PROVENTIL HFA;VENTOLIN HFA) 108 (90 Base) MCG/ACT inhaler    Sig: Inhale 2 puffs into the lungs every 4 (four) hours as needed for wheezing or shortness of breath.    Dispense:  1 Inhaler    Refill:  2  . budesonide-formoterol (SYMBICORT) 160-4.5 MCG/ACT inhaler    Sig: Inhale 2 puffs into the lungs 2 (two) times daily.    Dispense:  1 Inhaler    Refill:  11    Orders Placed This Encounter  Procedures  . Immunoglobulins, QN, A/E/G/M  . HIV Antibody (routine testing w rflx)  . CBC with Differential/Platelet  . COMPLETE  METABOLIC PANEL WITH GFR  . TSH  . T4, free  . Vitamin B12  . VITAMIN D 25 Hydroxy (Vit-D Deficiency, Fractures)  . Urinalysis w microscopic + reflex cultur  . Immunofixation electrophoresis  . REFLEXIVE URINE CULTURE  . TEST AUTHORIZATION

## 2018-10-07 ENCOUNTER — Other Ambulatory Visit: Payer: Self-pay | Admitting: Family Medicine

## 2018-10-07 DIAGNOSIS — E038 Other specified hypothyroidism: Secondary | ICD-10-CM

## 2018-10-07 MED ORDER — LEVOTHYROXINE SODIUM 50 MCG PO TABS: ORAL_TABLET | ORAL | 2 refills | Status: DC

## 2018-10-08 ENCOUNTER — Other Ambulatory Visit: Payer: Self-pay | Admitting: Family Medicine

## 2018-10-08 DIAGNOSIS — E875 Hyperkalemia: Secondary | ICD-10-CM

## 2018-10-08 DIAGNOSIS — D803 Selective deficiency of immunoglobulin G [IgG] subclasses: Secondary | ICD-10-CM

## 2018-10-08 NOTE — Progress Notes (Signed)
Recheck BMP Refer to immunology, consider immunoglobulin therapy for recurrent sinusiits

## 2018-10-09 LAB — COMPLETE METABOLIC PANEL WITH GFR
AG RATIO: 2 (calc) (ref 1.0–2.5)
ALKALINE PHOSPHATASE (APISO): 36 U/L (ref 33–130)
ALT: 44 U/L — AB (ref 6–29)
AST: 39 U/L — ABNORMAL HIGH (ref 10–35)
Albumin: 4.2 g/dL (ref 3.6–5.1)
BILIRUBIN TOTAL: 0.4 mg/dL (ref 0.2–1.2)
BUN / CREAT RATIO: 26 (calc) — AB (ref 6–22)
BUN: 30 mg/dL — AB (ref 7–25)
CHLORIDE: 106 mmol/L (ref 98–110)
CO2: 29 mmol/L (ref 20–32)
Calcium: 10.5 mg/dL — ABNORMAL HIGH (ref 8.6–10.4)
Creat: 1.16 mg/dL — ABNORMAL HIGH (ref 0.50–1.05)
GFR, EST AFRICAN AMERICAN: 62 mL/min/{1.73_m2} (ref >=60)
GFR, Est Non African American: 53 mL/min/{1.73_m2} — ABNORMAL LOW (ref >=60)
GLUCOSE: 164 mg/dL — AB (ref 65–99)
Globulin: 2.1 g/dL (calc) (ref 1.9–3.7)
POTASSIUM: 5.4 mmol/L — AB (ref 3.5–5.3)
Sodium: 144 mmol/L (ref 135–146)
TOTAL PROTEIN: 6.3 g/dL (ref 6.1–8.1)

## 2018-10-09 LAB — URINALYSIS W MICROSCOPIC + REFLEX CULTURE
Bacteria, UA: NONE SEEN /HPF
Bilirubin Urine: NEGATIVE
HYALINE CAST: NONE SEEN /LPF
Hgb urine dipstick: NEGATIVE
Ketones, ur: NEGATIVE
Leukocyte Esterase: NEGATIVE
Nitrites, Initial: NEGATIVE
PROTEIN: NEGATIVE
RBC / HPF: NONE SEEN /HPF (ref 0–2)
SPECIFIC GRAVITY, URINE: 1.034 (ref 1.001–1.03)
pH: 6 (ref 5.0–8.0)

## 2018-10-09 LAB — CBC WITH DIFFERENTIAL/PLATELET
ABSOLUTE MONOCYTES: 573 {cells}/uL (ref 200–950)
BASOS ABS: 41 {cells}/uL (ref 0–200)
BASOS PCT: 0.6 %
EOS ABS: 228 {cells}/uL (ref 15–500)
Eosinophils Relative: 3.3 %
HCT: 38 % (ref 35.0–45.0)
Hemoglobin: 12 g/dL (ref 11.7–15.5)
Lymphs Abs: 2636 cells/uL (ref 850–3900)
MCH: 28.1 pg (ref 27.0–33.0)
MCHC: 31.6 g/dL — AB (ref 32.0–36.0)
MCV: 89 fL (ref 80.0–100.0)
MONOS PCT: 8.3 %
MPV: 10.3 fL (ref 7.5–12.5)
NEUTROS PCT: 49.6 %
Neutro Abs: 3422 cells/uL (ref 1500–7800)
Platelets: 278 10*3/uL (ref 140–400)
RBC: 4.27 10*6/uL (ref 3.80–5.10)
RDW: 14.1 % (ref 11.0–15.0)
TOTAL LYMPHOCYTE: 38.2 %
WBC: 6.9 10*3/uL (ref 3.8–10.8)

## 2018-10-09 LAB — TEST AUTHORIZATION

## 2018-10-09 LAB — T4, FREE: Free T4: 1 ng/dL (ref 0.8–1.8)

## 2018-10-09 LAB — IMMUNOFIXATION ELECTROPHORESIS
IgG (Immunoglobin G), Serum: 300 mg/dL — ABNORMAL LOW (ref 600–1640)
IgM, Serum: 68 mg/dL (ref 50–300)
Immunofix Electr Int: NOT DETECTED
Immunoglobulin A: 164 mg/dL (ref 47–310)

## 2018-10-09 LAB — NO CULTURE INDICATED

## 2018-10-09 LAB — VITAMIN B12: Vitamin B-12: 489 pg/mL (ref 200–1100)

## 2018-10-09 LAB — HIV ANTIBODY (ROUTINE TESTING W REFLEX): HIV 1&2 Ab, 4th Generation: NONREACTIVE

## 2018-10-09 LAB — TSH: TSH: 0.36 m[IU]/L — AB

## 2018-10-09 LAB — VITAMIN D 25 HYDROXY (VIT D DEFICIENCY, FRACTURES): Vit D, 25-Hydroxy: 32 ng/mL (ref 30–100)

## 2018-10-14 ENCOUNTER — Telehealth: Payer: Self-pay | Admitting: Family Medicine

## 2018-10-14 NOTE — Telephone Encounter (Signed)
'  [name]' with [facility] calling for clarification on Synthroid dosages. Questioning if alternating doses are throughout week or doses alternate  weekly.  CB [phone]   '[name]'

## 2018-10-14 NOTE — Telephone Encounter (Signed)
Drew notified.

## 2018-10-18 ENCOUNTER — Ambulatory Visit (INDEPENDENT_AMBULATORY_CARE_PROVIDER_SITE_OTHER): Payer: Medicare HMO

## 2018-10-18 DIAGNOSIS — E538 Deficiency of other specified B group vitamins: Secondary | ICD-10-CM | POA: Diagnosis not present

## 2018-10-18 MED ORDER — CYANOCOBALAMIN 1000 MCG/ML IJ SOLN
1000.0000 ug | Freq: Once | INTRAMUSCULAR | Status: AC
  Administered 2018-10-18: 1000 ug via INTRAMUSCULAR

## 2018-10-18 NOTE — Progress Notes (Signed)
Patient came in for her B-12 injection. She tolerated it well. NKDA.

## 2018-10-21 ENCOUNTER — Ambulatory Visit (INDEPENDENT_AMBULATORY_CARE_PROVIDER_SITE_OTHER): Payer: Medicare HMO | Admitting: Nurse Practitioner

## 2018-10-21 ENCOUNTER — Encounter: Payer: Self-pay | Admitting: Nurse Practitioner

## 2018-10-21 VITALS — BP 94/60 | HR 75 | Temp 98.6°F | Resp 14 | Ht 59.0 in | Wt 187.0 lb

## 2018-10-21 DIAGNOSIS — R52 Pain, unspecified: Secondary | ICD-10-CM

## 2018-10-21 DIAGNOSIS — S99929A Unspecified injury of unspecified foot, initial encounter: Secondary | ICD-10-CM | POA: Diagnosis not present

## 2018-10-21 DIAGNOSIS — R05 Cough: Secondary | ICD-10-CM

## 2018-10-21 DIAGNOSIS — K529 Noninfective gastroenteritis and colitis, unspecified: Secondary | ICD-10-CM

## 2018-10-21 DIAGNOSIS — R059 Cough, unspecified: Secondary | ICD-10-CM

## 2018-10-21 LAB — BASIC METABOLIC PANEL WITH GFR
BUN/Creatinine Ratio: 27 (calc) — ABNORMAL HIGH (ref 6–22)
BUN: 33 mg/dL — ABNORMAL HIGH (ref 7–25)
CO2: 25 mmol/L (ref 20–32)
Calcium: 9 mg/dL (ref 8.6–10.4)
Chloride: 97 mmol/L — ABNORMAL LOW (ref 98–110)
Creat: 1.22 mg/dL — ABNORMAL HIGH (ref 0.50–1.05)
GFR, Est African American: 58 mL/min/{1.73_m2} — ABNORMAL LOW (ref >=60)
GFR, Est Non African American: 50 mL/min/{1.73_m2} — ABNORMAL LOW (ref >=60)
Glucose, Bld: 232 mg/dL — ABNORMAL HIGH (ref 65–99)
POTASSIUM: 4.7 mmol/L (ref 3.5–5.3)
SODIUM: 135 mmol/L (ref 135–146)

## 2018-10-21 MED ORDER — FAMOTIDINE 20 MG PO TABS: 20.0000 mg | ORAL_TABLET | Freq: Every day | ORAL | 0 refills | Status: DC

## 2018-10-21 MED ORDER — PROMETHAZINE-DM 6.25-15 MG/5ML PO SYRP: 2.5000 mL | ORAL_SOLUTION | Freq: Three times a day (TID) | ORAL | 0 refills | Status: DC | PRN

## 2018-10-21 NOTE — Progress Notes (Signed)
Name: [name]   MRN: [id number]    DOB: 04-Jul-1966   Date:10/21/2018       Progress Note  Subjective  Chief Complaint  Chief Complaint  Patient presents with  . Emesis    patient has not been able to keep anything done since saturday night  . Diarrhea    started saturday night  . Toe Injury    patient had a hang nail that she pulled and it ripped par tof her nail - concerned about it getting infected    HPI  Paitent's right foot first digit is painful. States on Friday she has a hangnail that somehow got pulled off. Now she notes increased pain especially with sock or shoe on it. Has redness below nail injury. Denies fevers or chills. States has had some clear drainage. No streaking.   States has had nausea and vomiting and diarrhea, states has mild abdominal cramping since Friday. [place] daughter has the flu. Endorses body aches, cough rhinorrhea and headache. Takes zofran with temporary relief. Had 3 episodes of diarrhea yesterday and 1 episode of emesis. Denies bleeding, chest pain, abdominal tenderness or pain.   Patient Active Problem List   Diagnosis Date Noted  . Vitamin B12 deficiency 08/08/2018  . Encounter for Medicare annual wellness exam 10/30/2017  . Patient is full code 10/30/2017  . Disorder of bursae of shoulder region 10/09/2017  . Coronary artery disease 10/09/2017  . Plantar fascial fibromatosis 10/09/2017  . Tendinitis of wrist 10/09/2017  . Schizoaffective disorder, bipolar type ([place]) 09/28/2017  . Chest pain 09/10/2017  . Lobar pneumonia ([place]) 06/05/2017  . Multifocal pneumonia 05/27/2017  . Pleuritic chest pain 05/27/2017  . Hypertriglyceridemia 12/21/2016  . Pansinusitis 11/15/2016  . 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 Breast cancer screening 09/28/2016  . Urine frequency 09/28/2016  . Preventative health care 09/28/2016  . Fatigue 05/01/2016  . Urinary tract infection 03/14/2016  . Adenomatous colon polyp 10/15/2015  .  Airway hyperreactivity 10/15/2015  . Bipolar affective disorder ([place]) 10/15/2015  . CN (constipation) 10/15/2015  . CAFL (chronic airflow limitation) ([place]) 10/15/2015  . Drug-induced hepatic toxicity 10/15/2015  . Blood in feces 10/15/2015  . HLD (hyperlipidemia) 10/15/2015  . Hypoxemia 10/15/2015  . NASH (nonalcoholic steatohepatitis) 10/15/2015  . Morbid obesity ([place]) 10/15/2015  . Restless leg syndrome 10/15/2015  . Type 2 diabetes, uncontrolled, with neuropathy ([place]) 10/15/2015  . Elevated alkaline phosphatase level 09/22/2015  . Urinary bladder incontinence 09/22/2015  . Low back pain with left-sided sciatica 08/18/2015  . Encounter for monitoring tricyclic antidepressant therapy 05/13/2015  . Bipolar disorder ([place]) 05/11/2015  . Liver disease 05/11/2015  . Apnea, sleep 05/05/2015  . Heart valve disease 05/05/2015  . Chronic kidney disease, stage III (moderate) (HCC) 03/15/2015  . Migraine 03/12/2015  . COPD (chronic obstructive pulmonary disease) ([place]) 01/20/2015  . 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[place]) 01/20/2015  . GERD (gastroesophageal reflux disease) 01/20/2015  . Hypothyroid   . Vitamin D deficiency   . Urinary incontinence   . Liver lesion   . Hypomagnesemia   . Obstructive sleep apnea 10/20/2013  . Vaginitis 12/19/2011  . PERIPHERAL NEUROPATHY 03/04/2009  . HEMORRHOIDS, EXTERNAL 09/03/2008  . ALLERGIC RHINITIS 07/04/2007  . ASTHMA, PERSISTENT, MODERATE 07/04/2007  . INTERSTITIAL CYSTITIS 07/04/2007  . HYPERTENSION, BENIGN ESSENTIAL 06/19/2007    Past Medical History:  Diagnosis Date  . Anxiety   . Arthritis   . Asthma   . Bipolar depression ([place])   .  CHF (congestive heart failure) ([place])   . Chronic headaches    migraines have lessened  . Chronic kidney disease    ddstage 111  . COPD (chronic obstructive pulmonary disease) ([place])   . Coronary artery disease   . Depression   . Diabetic neuropathy ([place])   . Dyspnea    uses inhalers for this  .  Dysrhythmia    rapid  . Fatty liver   . GERD (gastroesophageal reflux disease)   . Headache   . Heart attack ([place]) 2009  . Hyperlipidemia   . Hypertension   . Hypomagnesemia   . Hypothyroid   . Irregular heart rhythm   . Liver lesion    Favored to be benign on CT; MRI of abd w/contract in Aug 2016. Liver Biopsy-Negative, March 2016  . Morbid obesity ([place])   . OSA (obstructive sleep apnea)   . Peripheral vascular disease ([place])   . Pinched nerve    Back  . Pneumonia   . RLS (restless legs syndrome)   . Seasonal allergies   . Sleep apnea    uses oxygen at night 2L d/t cpap did not fit  . Type 2 diabetes, uncontrolled, with neuropathy ([place]) 10/15/2015   Overview:  Microalbumin 7.7 12/2010: Hgb A1c 10.2% 12/2010 and 9.7% 03/2011, eye exam 10/2009 [facility] DM clinic   . Urinary incontinence   . Vitamin D deficiency     Past Surgical History:  Procedure Laterality Date  . ABDOMINAL HYSTERECTOMY    . ABDOMINAL HYSTERECTOMY     Partial  . bladder stim  2007  . BLADDER SURGERY     x 2. tacking of bladder and the other was the stimulator placement  . CHOLECYSTECTOMY    . INTERSTIM-GENERATOR CHANGE N/A 12/31/2017   Procedure: INTERSTIM-GENERATOR CHANGE;  Surgeon: Bjorn Loser, MD;  Location: [facility] ORS;  Service: Urology;  Laterality: N/A;  . LIVER BIOPSY  March 2016   Negative  . LUNG BIOPSY  2015   small spot on lung  . TUBAL LIGATION      Social History   Tobacco Use  . Smoking status: Never Smoker  . Smokeless tobacco: Never Used  Substance Use Topics  . Alcohol use: No     Current Outpatient Medications:  .  albuterol (PROVENTIL HFA;VENTOLIN HFA) 108 (90 Base) MCG/ACT inhaler, Inhale 2 puffs into the lungs every 4 (four) hours as needed for wheezing or shortness of breath., Disp: 1 Inhaler, Rfl: 2 .  amLODipine (NORVASC) 2.5 MG tablet, Take 1 tablet (2.5 mg total) by mouth daily., Disp: 30 tablet, Rfl: 1 .  atorvastatin (LIPITOR) 80 MG tablet, Take 80 mg by mouth at  bedtime. , Disp: , Rfl:  .  budesonide-formoterol (SYMBICORT) 160-4.5 MCG/ACT inhaler, Inhale 2 puffs into the lungs 2 (two) times daily., Disp: 1 Inhaler, Rfl: 11 .  cholecalciferol (VITAMIN D) 1000 units tablet, Take 1,000 Units by mouth daily., Disp: , Rfl:  .  divalproex (DEPAKOTE ER) 250 MG 24 hr tablet, divalproex ER 250 mg tablet,extended release 24 hr, Disp: , Rfl:  .  DULoxetine (CYMBALTA) 60 MG capsule, Take 120 mg by mouth daily., Disp: , Rfl:  .  empagliflozin (JARDIANCE) 25 MG TABS tablet, Take 25 mg by mouth daily., Disp: , Rfl:  .  ezetimibe (ZETIA) 10 MG tablet, Take 10 mg by mouth daily. , Disp: , Rfl:  .  fenofibrate micronized (LOFIBRA) 134 MG capsule, Take 134 mg by mouth at bedtime. , Disp: , Rfl:  .  fluticasone (FLONASE) 50 MCG/ACT nasal spray, Place 2 sprays into both nostrils daily., Disp: 16 g, Rfl: 6 .  gabapentin (NEURONTIN) 300 MG capsule, Take 1 capsule (300 mg total) by mouth 2 (two) times daily. Please schedule appointment to receive further refills., Disp: 60 capsule, Rfl: 0 .  HUMULIN R U-500 KWIKPEN 500 UNIT/ML kwikpen, Inject 70 Units as directed 3 (three) times daily. , Disp: , Rfl:  .  hydrOXYzine (VISTARIL) 50 MG capsule, Take 50-100 mg by mouth daily as needed for anxiety. , Disp: , Rfl:  .  insulin aspart (NOVOLOG) 100 UNIT/ML injection, Inject 5-25 Units into the skin 3 (three) times daily before meals. , Disp: , Rfl:  .  lamoTRIgine (LAMICTAL) 200 MG tablet, Take 200 mg by mouth 2 (two) times daily. , Disp: , Rfl:  .  levothyroxine (SYNTHROID, LEVOTHROID) 50 MCG tablet, One by mouth daily three days a week, one and one-half pills four days a week (Mon, Wed, Fri, Sat for example), Disp: 40 tablet, Rfl: 2 .  linaclotide (LINZESS) 72 MCG capsule, Take 1 capsule (72 mcg total) by mouth daily before breakfast., Disp: 90 capsule, Rfl: 3 .  lisinopril (PRINIVIL,ZESTRIL) 2.5 MG tablet, Take 1 tablet (2.5 mg total) by mouth daily., Disp: 30 tablet, Rfl: 1 .   loratadine (CLARITIN) 10 MG tablet, Take 1 tablet (10 mg total) by mouth daily. (Patient not taking: Reported on 10/04/2018), Disp: 30 tablet, Rfl: 0 .  metoprolol tartrate (LOPRESSOR) 25 MG tablet, Take one tablet 25 mg in the am and 1/2 tab in the pm, Disp: 45 tablet, Rfl: 6 .  ondansetron (ZOFRAN) 4 MG tablet, Take 1 tablet (4 mg total) by mouth every 8 (eight) hours as needed for nausea or vomiting., Disp: 20 tablet, Rfl: 0 .  pantoprazole (PROTONIX) 40 MG tablet, Take 1 tablet (40 mg total) by mouth daily., Disp: 90 tablet, Rfl: 3 .  QUEtiapine (SEROQUEL) 400 MG tablet, Take 400 mg by mouth at bedtime., Disp: , Rfl:  .  traZODone (DESYREL) 100 MG tablet, Take 100 mg by mouth at bedtime., Disp: , Rfl:  .  TRULICITY 1.5 AS/5.0NL SOPN, Inject 1.5 mLs as directed once a week. , Disp: , Rfl:  .  ULTICARE SHORT PEN NEEDLES 31G X 8 MM MISC, , Disp: , Rfl:   Allergies  Allergen Reactions  . Mirtazapine Anaphylaxis    REACTION: closes throat  . Morphine And Related     anaphylaxis  . Sulfamethoxazole-Trimethoprim Rash    REACTION: [place] all over  . Amoxicillin Er Nausea And Vomiting    Has patient had a PCN reaction causing immediate rash, facial/tongue/throat swelling, SOB or lightheadedness with hypotension: No Has patient had a PCN reaction causing severe rash involving mucus membranes or skin necrosis: No Has patient had a PCN reaction that required hospitalization: No Has patient had a PCN reaction occurring within the last 10 years: Yes If all of the above answers are "NO", then may proceed with Cephalosporin use.  . Codeine Itching    REACTION: itch, rash  . Hydrocodone-Acetaminophen Rash    REACTION: rash  . Keflex [Cephalexin] Rash  . Prasterone Nausea And Vomiting    ROS   No other specific complaints in a complete review of systems (except as listed in HPI above).  Objective  Vitals:   10/21/18 1012  BP: 94/60  Pulse: 75  Resp: 14  Temp: 98.6 F (37 C)  TempSrc:  Oral  SpO2: 94%  Weight: 187 lb (84.8 kg)  Height: 4' 11"  (1.499 m)    Body mass index is 37.77 kg/m.  Nursing Note and Vital Signs reviewed.  Physical Exam Vitals signs reviewed.  Constitutional:      Appearance: She is well-developed.  HENT:     Head: Normocephalic and atraumatic.  Neck:     Musculoskeletal: Normal range of motion and neck supple.     Vascular: No carotid bruit.  Cardiovascular:     Heart sounds: Normal heart sounds.  Pulmonary:     Effort: Pulmonary effort is normal.     Breath sounds: Normal breath sounds.  Abdominal:     General: Bowel sounds are increased.     Palpations: Abdomen is soft.     Tenderness: There is no abdominal tenderness. There is no right CVA tenderness or left CVA tenderness.  Musculoskeletal: Normal range of motion.       Feet:  Skin:    General: Skin is warm and dry.     Capillary Refill: Capillary refill takes less than 2 seconds.  Neurological:     Mental Status: She is alert and oriented to person, place, and time.     GCS: GCS eye subscore is 4. GCS verbal subscore is 5. GCS motor subscore is 6.     Sensory: No sensory deficit.  Psychiatric:        Speech: Speech normal.        Behavior: Behavior normal.        Thought Content: Thought content normal.        Judgment: Judgment normal.      No results found for this or any previous visit (from the past 48 hour(s)).  Assessment & Plan  1. Injury of nail bed of toe Discussed keeping clean and dry, and using neosporin. Patient has been on numerous antibiotics in the last 6 months and has multiple allergies to various antibiotic classes- discussed conservative treatment- return in 2 days for recheck- if worsening or unimproved will start oral antibiotics. Discussed ER precautions. Follow-up with podiatry for nail bed injury.  - Ambulatory referral to Podiatry  2. Gastroenteritis Discussed importance of prevention of dehydration and electrolyte imbalances, take pepto  PRN, ER precautions discussed.  - famotidine (PEPCID) 20 MG tablet; Take 1 tablet (20 mg total) by mouth at bedtime.  Dispense: 14 tablet; Refill: 0 - BASIC METABOLIC PANEL WITH GFR - promethazine-dextromethorphan (PROMETHAZINE-DM) 6.25-15 MG/5ML syrup; Take 2.5 mLs by mouth 3 (three) times daily as needed.  Dispense: 118 mL; Refill: 0  3. Cough - promethazine-dextromethorphan (PROMETHAZINE-DM) 6.25-15 MG/5ML syrup; Take 2.5 mLs by mouth 3 (three) times daily as needed.  Dispense: 118 mL; Refill: 0    -Red flags and when to present for emergency care or RTC including fever >101.18F, streaking redness, copious drainage from toe nail, unable to keep fluids down, palpitations, severe abdominal pain, chest pain, shortness of breath, new/worsening/un-resolving symptoms,  reviewed with patient at time of visit. Follow up and care instructions discussed and provided in AVS. Face-to-face time with patient was more than 25 minutes, >50% time spent counseling and coordination of care

## 2018-10-21 NOTE — Patient Instructions (Signed)
For your toe nail:  Keep your hands and feet clean and dry.  Clip your fingernails and toenails short and keep them clean.  Don't walk barefoot in areas like locker rooms or public showers.  Don't share nail clippers with other people.  Use mild soap and water to keep the area clean and pat dry throughly afterwards  Apply small amount of antibiotic ointment to area and keep it covered  Use cotton/absorbant socks to prevent sweating and change as frequently as needed  Monitor for redness, increase pain, drainage, fevers and chills and come in of re-evaluation if they occur.   Gastroenteritis (also known as stomach flu, although unrelated to influenza) is inflammation of the gastrointestinal tract, involving both the stomach and intestines. How did I get it? Gastroenteritis can have many causes, including viral or bacterial infections, medication reactions, food allergies, food/water poisoning or abuse of laxatives or alcohol. The duration and severity of the condition is relative to the illness. What are the symptoms? Symptoms can include fatigue, lack of appetite, abdominal growling and cramping, nausea, vomiting and/or diarrhea and are usually brief. Typically, no serious consequences occur and the condition resolves itself in a few days without medical treatment. How do I treat it? -Take nausea medicine as needed for nausea. If needed you can take loperamide (Imodium) for diarrhea, but avoid taking it for more than 2 days at a time as it can then lead to constipation.  -Drink fluids and get plenty of rest. Do not consume alcohol or caffeine. Avoid medications containing aspirin or ibuprofen, which may irritate your stomach, and do not take any medications by mouth unless directed by your medical care provider. 1. Drink clear liquids. Sip water/half-strength sports drinks or suck on ice chips. If you vomit using this treatment, do not take anything for 1 hour and start over again. 2. If  you do not vomit fluids, you may progress to full-strength sports drinks; popsicles; clear broth; bouillon; decaf tea; clear apple juice; plain-flavored gelatin; and half-strength, clear, carbonated beverages without fizz (ginger ale, lemon-lime sodas, etc.). NOTE: To remove the fizz from soda, pour some into a glass and stir with a spoon. 3. As you become hungry, try moving to soft foods. Some examples include: saltine crackers, dry white bread/toast, bananas, apple sauce, plain white rice, soft cereals prepared with water, plain noodles and broth soups. Do not use sauces or condiments, including butter. You may return to a normal diet as tolerated within 24 hours after recovery from vomiting. Recommended diets THINGS TO AVOID WHILE RECOVERING: . Alcohol  . Caffeine  . Dairy products  . Citrus products  . Fatty, greasy and/or fried foods  . Raw fruits and vegetables  . Aspirin  . Ibuprofen CLEAR LIQUID DIET: . Apple, grape or cranberry juice  . Kool-Aid  . Fruit punch  . Gatorade  . Ginger ale or 7UP  . Decaf tea  . Clear bouillon  . Jello  . Popsicles  . Fruit ice  . Salt FULL LIQUID DIET: . Cocoa  . Carbonated, decaf beverages  . Broth  . Strained, bland soups  . Cream of wheat or rice cereals  . Domenick Gong  . Vegetable juices  . Strained fruit juices or nectars  . Sherbets  . Honey  . Cinnamon  . Nutmeg  . Vanilla or other extracts CLEAR LIQUID DIET, PLUS: . Coffee  . White bread or toast  . Cooked or ready-to-eat cereal (no bran)  . Graham crackers  . Saltines  .  Pasta or rice  . Soft, cooked vegetables  . Boiled or mashed potatoes  . Apple sauce  . Bananas or seedless melon  . Cooked or canned fruits  . Mild cheese or cottage cheese SOFT FULL LIQUID DIET, PLUS: . Soft-cooked, poached or hard-boiled or scrambled eggs  . Tender meat, fish or poultry  . Soft cake or cookies without nuts or raisins  . Butter, cream or margarine  . Jelly  SEEK MEDICAL TREATMENT  IF: . You are unable to keep fluids down.  . You see blood or mucus in your stool.  . You vomit black or dark red material.  . You have a fever of 101?F (38.33?C) or higher.  . You have localized and/or persistent abdominal pain.

## 2018-10-23 ENCOUNTER — Ambulatory Visit: Payer: Self-pay | Admitting: Nurse Practitioner

## 2018-10-23 ENCOUNTER — Other Ambulatory Visit: Payer: Self-pay

## 2018-10-23 MED ORDER — AMLODIPINE BESYLATE 2.5 MG PO TABS: 2.5000 mg | ORAL_TABLET | Freq: Every day | ORAL | 1 refills | Status: DC

## 2018-10-23 MED ORDER — LISINOPRIL 2.5 MG PO TABS: 2.5000 mg | ORAL_TABLET | Freq: Every day | ORAL | 1 refills | Status: DC

## 2018-10-23 NOTE — Telephone Encounter (Signed)
Lab Results  Component Value Date   CREATININE 1.22 (H) 10/21/2018   Lab Results  Component Value Date   K 4.7 10/21/2018

## 2018-10-24 ENCOUNTER — Other Ambulatory Visit: Payer: Self-pay

## 2018-10-24 MED ORDER — GABAPENTIN 300 MG PO CAPS: 300.0000 mg | ORAL_CAPSULE | Freq: Two times a day (BID) | ORAL | 0 refills | Status: DC

## 2018-10-24 NOTE — Telephone Encounter (Signed)
Pharmacy refill request for Gabapentin    Per Dr. Amalia Hailey, ok to refill, but needs follow up appt.   Script has been sent to pharmacy

## 2018-10-29 ENCOUNTER — Encounter: Payer: Self-pay | Admitting: Family Medicine

## 2018-10-29 ENCOUNTER — Other Ambulatory Visit: Payer: Self-pay

## 2018-10-29 ENCOUNTER — Ambulatory Visit (INDEPENDENT_AMBULATORY_CARE_PROVIDER_SITE_OTHER): Payer: Medicare HMO | Admitting: Family Medicine

## 2018-10-29 VITALS — BP 118/82 | HR 84 | Temp 98.7°F | Resp 14 | Ht 59.0 in | Wt 185.6 lb

## 2018-10-29 DIAGNOSIS — N3001 Acute cystitis with hematuria: Secondary | ICD-10-CM

## 2018-10-29 LAB — POCT URINALYSIS DIPSTICK
Bilirubin, UA: NEGATIVE
GLUCOSE UA: POSITIVE — AB
Ketones, UA: NEGATIVE
Nitrite, UA: POSITIVE
Protein, UA: POSITIVE — AB
Spec Grav, UA: 1.01 (ref 1.010–1.025)
Urobilinogen, UA: 0.2 E.U./dL
pH, UA: 5 (ref 5.0–8.0)

## 2018-10-29 MED ORDER — AMOXICILLIN-POT CLAVULANATE 875-125 MG PO TABS: 1.0000 | ORAL_TABLET | Freq: Two times a day (BID) | ORAL | 0 refills | Status: AC

## 2018-10-29 MED ORDER — PROMETHAZINE HCL 12.5 MG PO TABS: 12.5000 mg | ORAL_TABLET | Freq: Three times a day (TID) | ORAL | 0 refills | Status: DC | PRN

## 2018-10-29 NOTE — Progress Notes (Signed)
Name: [name]   MRN: [id number]    DOB: 03/15/1966   Date:10/29/2018       Progress Note  Subjective  Chief Complaint  Chief Complaint  Patient presents with  . Urinary Tract Infection  . Nail Problem    right big toe    HPI  Pt presents with concern for UTI: Onset yesterday, with right flank and lower abdominal/suprapubic pain. Endorses urinary urgency/frequency.  No fevers/chills, no malaise.  She is allergic to several antibiotics - bactrim, PCN's (nausea and vomiting only), and Keflex, and she is >48yo with some ongoing decline in kidney function.  She only gets n/v with Amoxicillin, so we will send in augmentin and phenergan for nausea, has been on this medication several times int he past without anaphylactic rxn..  No history kidney stones.  RIGHT Great toe: Healing, states it is improved since last visit. She does have podiatry appointment in 1 month.  Pain and swelling are both improved.  Patient Active Problem List   Diagnosis Date Noted  . Vitamin B12 deficiency 08/08/2018  . Encounter for Medicare annual wellness exam 10/30/2017  . Patient is full code 10/30/2017  . Disorder of bursae of shoulder region 10/09/2017  . Coronary artery disease 10/09/2017  . Plantar fascial fibromatosis 10/09/2017  . Tendinitis of wrist 10/09/2017  . Schizoaffective disorder, bipolar type ([place]) 09/28/2017  . Chest pain 09/10/2017  . Lobar pneumonia ([place]) 06/05/2017  . Multifocal pneumonia 05/27/2017  . Pleuritic chest pain 05/27/2017  . Hypertriglyceridemia 12/21/2016  . Pansinusitis 11/15/2016  . 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 Breast cancer screening 09/28/2016  . Urine frequency 09/28/2016  . Preventative health care 09/28/2016  . Fatigue 05/01/2016  . Urinary tract infection 03/14/2016  . Adenomatous colon polyp 10/15/2015  . Airway hyperreactivity 10/15/2015  . Bipolar affective disorder ([place]) 10/15/2015  . CN (constipation) 10/15/2015  .  CAFL (chronic airflow limitation) ([place]) 10/15/2015  . Drug-induced hepatic toxicity 10/15/2015  . Blood in feces 10/15/2015  . HLD (hyperlipidemia) 10/15/2015  . Hypoxemia 10/15/2015  . NASH (nonalcoholic steatohepatitis) 10/15/2015  . Morbid obesity ([place]) 10/15/2015  . Restless leg syndrome 10/15/2015  . Type 2 diabetes, uncontrolled, with neuropathy ([place]) 10/15/2015  . Elevated alkaline phosphatase level 09/22/2015  . Urinary bladder incontinence 09/22/2015  . Low back pain with left-sided sciatica 08/18/2015  . Encounter for monitoring tricyclic antidepressant therapy 05/13/2015  . Bipolar disorder ([place]) 05/11/2015  . Liver disease 05/11/2015  . Apnea, sleep 05/05/2015  . Heart valve disease 05/05/2015  . Chronic kidney disease, stage III (moderate) ([facility]) 03/15/2015  . Migraine 03/12/2015  . COPD (chronic obstructive pulmonary disease) ([place]) 01/20/2015  . 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[place]) 01/20/2015  . GERD (gastroesophageal reflux disease) 01/20/2015  . Hypothyroid   . Vitamin D deficiency   . Urinary incontinence   . Liver lesion   . Hypomagnesemia   . Obstructive sleep apnea 10/20/2013  . Vaginitis 12/19/2011  . PERIPHERAL NEUROPATHY 03/04/2009  . HEMORRHOIDS, EXTERNAL 09/03/2008  . ALLERGIC RHINITIS 07/04/2007  . ASTHMA, PERSISTENT, MODERATE 07/04/2007  . INTERSTITIAL CYSTITIS 07/04/2007  . HYPERTENSION, BENIGN ESSENTIAL 06/19/2007    Social History   Tobacco Use  . Smoking status: Never Smoker  . Smokeless tobacco: Never Used  Substance Use Topics  . Alcohol use: No     Current Outpatient Medications:  .  albuterol (PROVENTIL HFA;VENTOLIN HFA) 108 (90 Base) MCG/ACT inhaler, Inhale 2 puffs into the lungs every 4 (four)  hours as needed for wheezing or shortness of breath., Disp: 1 Inhaler, Rfl: 2 .  amLODipine (NORVASC) 2.5 MG tablet, Take 1 tablet (2.5 mg total) by mouth daily., Disp: 30 tablet, Rfl: 1 .  atorvastatin (LIPITOR) 80 MG tablet, Take 80 mg by  mouth at bedtime. , Disp: , Rfl:  .  budesonide-formoterol (SYMBICORT) 160-4.5 MCG/ACT inhaler, Inhale 2 puffs into the lungs 2 (two) times daily., Disp: 1 Inhaler, Rfl: 11 .  cholecalciferol (VITAMIN D) 1000 units tablet, Take 1,000 Units by mouth daily., Disp: , Rfl:  .  divalproex (DEPAKOTE ER) 250 MG 24 hr tablet, divalproex ER 250 mg tablet,extended release 24 hr, Disp: , Rfl:  .  DULoxetine (CYMBALTA) 60 MG capsule, Take 120 mg by mouth daily., Disp: , Rfl:  .  empagliflozin (JARDIANCE) 25 MG TABS tablet, Take 25 mg by mouth daily., Disp: , Rfl:  .  ezetimibe (ZETIA) 10 MG tablet, Take 10 mg by mouth daily. , Disp: , Rfl:  .  famotidine (PEPCID) 20 MG tablet, Take 1 tablet (20 mg total) by mouth at bedtime., Disp: 14 tablet, Rfl: 0 .  fenofibrate micronized (LOFIBRA) 134 MG capsule, Take 134 mg by mouth at bedtime. , Disp: , Rfl:  .  fluticasone (FLONASE) 50 MCG/ACT nasal spray, Place 2 sprays into both nostrils daily., Disp: 16 g, Rfl: 6 .  gabapentin (NEURONTIN) 300 MG capsule, Take 1 capsule (300 mg total) by mouth 2 (two) times daily. Please schedule appointment to receive further refills., Disp: 60 capsule, Rfl: 0 .  HUMULIN R U-500 KWIKPEN 500 UNIT/ML kwikpen, Inject 70 Units as directed 3 (three) times daily. , Disp: , Rfl:  .  hydrOXYzine (VISTARIL) 50 MG capsule, Take 50-100 mg by mouth daily as needed for anxiety. , Disp: , Rfl:  .  insulin aspart (NOVOLOG) 100 UNIT/ML injection, Inject 5-25 Units into the skin 3 (three) times daily before meals. , Disp: , Rfl:  .  lamoTRIgine (LAMICTAL) 200 MG tablet, Take 200 mg by mouth 2 (two) times daily. , Disp: , Rfl:  .  levothyroxine (SYNTHROID, LEVOTHROID) 50 MCG tablet, One by mouth daily three days a week, one and one-half pills four days a week (Mon, Wed, Fri, Sat for example), Disp: 40 tablet, Rfl: 2 .  linaclotide (LINZESS) 72 MCG capsule, Take 1 capsule (72 mcg total) by mouth daily before breakfast., Disp: 90 capsule, Rfl: 3 .   lisinopril (PRINIVIL,ZESTRIL) 2.5 MG tablet, Take 1 tablet (2.5 mg total) by mouth daily., Disp: 30 tablet, Rfl: 1 .  metoprolol tartrate (LOPRESSOR) 25 MG tablet, Take one tablet 25 mg in the am and 1/2 tab in the pm, Disp: 45 tablet, Rfl: 6 .  ondansetron (ZOFRAN) 4 MG tablet, Take 1 tablet (4 mg total) by mouth every 8 (eight) hours as needed for nausea or vomiting., Disp: 20 tablet, Rfl: 0 .  pantoprazole (PROTONIX) 40 MG tablet, Take 1 tablet (40 mg total) by mouth daily., Disp: 90 tablet, Rfl: 3 .  QUEtiapine (SEROQUEL) 400 MG tablet, Take 400 mg by mouth at bedtime., Disp: , Rfl:  .  traZODone (DESYREL) 100 MG tablet, Take 100 mg by mouth at bedtime., Disp: , Rfl:  .  TRULICITY 1.5 LK/9.1PH SOPN, Inject 1.5 mLs as directed once a week. , Disp: , Rfl:  .  ULTICARE SHORT PEN NEEDLES 31G X 8 MM MISC, , Disp: , Rfl:  .  promethazine-dextromethorphan (PROMETHAZINE-DM) 6.25-15 MG/5ML syrup, Take 2.5 mLs by mouth 3 (three) times daily as  needed. (Patient not taking: Reported on 10/29/2018), Disp: 118 mL, Rfl: 0  Allergies  Allergen Reactions  . Mirtazapine Anaphylaxis    REACTION: closes throat  . Morphine And Related     anaphylaxis  . Sulfamethoxazole-Trimethoprim Rash    REACTION: [place] all over  . Amoxicillin Er Nausea And Vomiting    Has patient had a PCN reaction causing immediate rash, facial/tongue/throat swelling, SOB or lightheadedness with hypotension: No Has patient had a PCN reaction causing severe rash involving mucus membranes or skin necrosis: No Has patient had a PCN reaction that required hospitalization: No Has patient had a PCN reaction occurring within the last 10 years: Yes If all of the above answers are "NO", then may proceed with Cephalosporin use.  . Codeine Itching    REACTION: itch, rash  . Hydrocodone-Acetaminophen Rash    REACTION: rash  . Keflex [Cephalexin] Rash  . Prasterone Nausea And Vomiting   I personally reviewed active problem list, medication  list, allergies, notes from last encounter, lab results with the patient/caregiver today.  ROS  Ten systems reviewed and is negative except as mentioned in HPI.  Objective  Vitals:   10/29/18 1308  BP: 118/82  Pulse: 84  Resp: 14  Temp: 98.7 F (37.1 C)  TempSrc: Oral  SpO2: 98%  Weight: 185 lb 9.6 oz (84.2 kg)  Height: 4' 11"  (1.499 m)   Body mass index is 37.49 kg/m.  Nursing Note and Vital Signs reviewed.  Physical Exam  Constitutional: Patient appears well-developed and well-nourished. Obese. No distress.  HEENT: head atraumatic, normocephalic Cardiovascular: Normal rate, regular rhythm and normal heart sounds.  No murmur heard. No BLE edema. Pulmonary/Chest: Effort normal and breath sounds clear bilaterally. No respiratory distress. Abdominal: Soft, bowel sounds normal, there is no tenderness, no HSM, equivocal CVA tenderness bilaterally. Psychiatric: Patient has a normal mood and affect. behavior is normal. Judgment and thought content normal.  Results for orders placed or performed in visit on 10/29/18 (from the past 72 hour(s))  POCT urinalysis dipstick     Status: Abnormal   Collection Time: 10/29/18  1:15 PM  Result Value Ref Range   Color, UA gold    Clarity, UA cloudy    Glucose, UA Positive (A) Negative   Bilirubin, UA negative    Ketones, UA negative    Spec Grav, UA 1.010 1.010 - 1.025   Blood, UA large    pH, UA 5.0 5.0 - 8.0   Protein, UA Positive (A) Negative   Urobilinogen, UA 0.2 0.2 or 1.0 E.U./dL   Nitrite, UA positive    Leukocytes, UA Large (3+) (A) Negative   Appearance cloudy    Odor strong     Assessment & Plan  1. Acute cystitis with hematuria - POCT urinalysis dipstick - amoxicillin-clavulanate (AUGMENTIN) 875-125 MG tablet; Take 1 tablet by mouth 2 (two) times daily for 7 days.  Dispense: 14 tablet; Refill: 0 - Lengthy discussion regarding abx choice - we would like to avoid fluoroquinolone if at all possible due to black box  warning; she has tolerated amoxicillin in the past. - promethazine (PHENERGAN) 12.5 MG tablet; Take 1 tablet (12.5 mg total) by mouth every 8 (eight) hours as needed for nausea or vomiting.  Dispense: 20 tablet; Refill: 0 - Urine Culture -Red flags and when to present for emergency care or RTC including fever >101.67F, chest pain, shortness of breath, new/worsening/un-resolving symptoms, severe flank pain, vomiting, reviewed with patient at time of visit. Follow up and care  instructions discussed and provided in AVS.

## 2018-10-31 LAB — URINE CULTURE
MICRO NUMBER:: 240722
SPECIMEN QUALITY: ADEQUATE

## 2018-11-05 ENCOUNTER — Other Ambulatory Visit: Payer: Self-pay

## 2018-11-05 NOTE — Telephone Encounter (Signed)
Patient has seen GI; will respectfully defer PPI prescriptions now to that office

## 2018-11-06 MED ORDER — PANTOPRAZOLE SODIUM 40 MG PO TBEC: 40.0000 mg | DELAYED_RELEASE_TABLET | Freq: Every day | ORAL | 3 refills | Status: DC

## 2018-11-07 ENCOUNTER — Telehealth: Payer: Self-pay | Admitting: Family Medicine

## 2018-11-07 ENCOUNTER — Encounter: Payer: Self-pay | Admitting: Family Medicine

## 2018-11-07 ENCOUNTER — Ambulatory Visit (INDEPENDENT_AMBULATORY_CARE_PROVIDER_SITE_OTHER): Payer: Medicare HMO | Admitting: Family Medicine

## 2018-11-07 VITALS — BP 100/70 | HR 87 | Temp 98.8°F | Resp 16 | Ht 64.0 in | Wt 187.9 lb

## 2018-11-07 DIAGNOSIS — R5383 Other fatigue: Secondary | ICD-10-CM

## 2018-11-07 DIAGNOSIS — E1151 Type 2 diabetes mellitus with diabetic peripheral angiopathy without gangrene: Secondary | ICD-10-CM | POA: Diagnosis not present

## 2018-11-07 DIAGNOSIS — J029 Acute pharyngitis, unspecified: Secondary | ICD-10-CM

## 2018-11-07 DIAGNOSIS — R52 Pain, unspecified: Secondary | ICD-10-CM | POA: Diagnosis not present

## 2018-11-07 LAB — POCT URINALYSIS DIPSTICK
Appearance: NORMAL
Bilirubin, UA: NEGATIVE
Blood, UA: NEGATIVE
Glucose, UA: POSITIVE — AB
Ketones, UA: NEGATIVE
Leukocytes, UA: NEGATIVE
Nitrite, UA: NEGATIVE
Odor: NORMAL
Protein, UA: NEGATIVE
SPEC GRAV UA: 1.015 (ref 1.010–1.025)
Urobilinogen, UA: 0.2 E.U./dL
pH, UA: 6.5 (ref 5.0–8.0)

## 2018-11-07 LAB — POCT INFLUENZA A/B
Influenza A, POC: NEGATIVE
Influenza B, POC: NEGATIVE

## 2018-11-07 LAB — POCT RAPID STREP A (OFFICE): Rapid Strep A Screen: NEGATIVE

## 2018-11-07 NOTE — Patient Instructions (Signed)
Diabetes Mellitus and Sick Day Management Blood sugar (glucose) can be difficult to control when you are sick. Common illnesses that can cause problems for people with diabetes (diabetes mellitus) include colds, fever, flu (influenza), nausea, vomiting, and diarrhea. These illnesses can cause stress and loss of body fluids (dehydration), and those issues can cause blood glucose levels to increase. Because of this, it is very important to take your insulin and diabetes medicines and eat some form of carbohydrate when you are sick. You should make a plan for days when you are sick (sick day plan) as part of your diabetes management plan. You and your health care provider should make this plan in advance. The following guidelines are intended to help you manage an illness that lasts for about 24 hours or less. Your health care provider may also give you more specific instructions. What do I need to do to manage my blood glucose?   Check your blood glucose every 2-4 hours, or as often as told by your health care provider.  Know your sick day treatment goals. Your target blood glucose levels may be different when you are sick.  If you use insulin, take your usual dose. ? If your blood glucose continues to be too high, you may need to take an additional insulin dose as told by your health care provider.  If you use oral diabetes medicine, you may need to stop taking it if you are not able to eat or drink normally. Ask your health care provider about whether you need to stop taking these medicines while you are sick.  If you use injectable hormone medicines other than insulin to control your diabetes, ask your health care provider about whether you need to stop taking these medicines while you are sick. What else can I do to manage my diabetes when I am sick? Check your ketones  If you have type 1 diabetes, check your urine ketones every 4 hours.  If you have type 2 diabetes, check your urine ketones  as often as told by your health care provider. Drink fluids  Drink enough fluid to keep your urine clear or pale yellow. This is especially important if you have a fever, vomiting, or diarrhea. Those symptoms can lead to dehydration.  Follow any instructions from your health care provider about beverages to avoid. ? Do not drink alcohol, caffeine, or drinks that contain a lot of sugar. Take medicines as directed  Take-over-the-counter and prescription medicines only as told by your health care provider.  Check medicine labels for added sugars. Some medicines may contain sugar or types of sugars that can raise your blood glucose level. What foods can I eat when I am sick?  You need to eat some form of carbohydrates when you are sick. You should eat 45-50 grams (45-50 g) of carbohydrates every 3-4 hours until you feel better. All of the food choices below contain about 15 g of carbohydrates. Plan ahead and keep some of these foods around so you have them if you get sick.  4-6 oz (120-177 mL) carbonated beverage that contains sugar, such as regular (not diet) soda. You may be able to drink carbonated beverages more easily if you open the beverage and let it sit at room temperature for a few minutes before drinking.   of a twin frozen ice pop.  4 oz (120 g) regular gelatin.  4 oz (120 mL) fruit juice.  4 oz (120 g) ice cream or frozen yogurt.  2  oz (60 g) sherbet.  8 oz (240 mL) clear broth or soup.  4 oz (120 g) regular custard.  4 oz (120 g) regular pudding.  8 oz (240 g) plain yogurt.  1 slice bread or toast.  6 saltine crackers.  5 vanilla wafers. Questions to ask your health care provider Consider asking the following questions so you know what to do on days when you are sick:  Should I adjust my diabetes medicines?  How often do I need to check my blood glucose?  What supplies do I need to manage my diabetes at home when I am sick?  What number can I call if I  have questions?  What foods and drinks should I avoid? Contact a health care provider if:  You develop symptoms of diabetic ketoacidosis, such as: ? Fatigue. ? Weight loss. ? Excessive thirst. ? Light-headedness. ? Fruity or sweet-smelling breath. ? Excessive urination. ? Vision changes. ? Confusion or irritability. ? Nausea. ? Vomiting. ? Rapid breathing. ? Pain in the abdomen. ? Feeling flushed.  You are unable to drink fluids without vomiting.  You have any of the following for more than 6 hours: ? Nausea. ? Vomiting. ? Diarrhea.  Your blood glucose is at or above 240 mg/dL (13.3 mmol/L), even after you take an additional insulin dose.  You have a change in how you think, feel, or act (mental status).  You develop another serious illness.  You have been sick or have had a fever for 2 days or longer and you are not getting better. Get help right away if:  Your blood glucose is lower than 54 mg/dL (3.0 mmol/L).  You have difficulty breathing.  You have moderate or high ketone levels in your urine.  You used emergency glucagon to treat low blood glucose. Summary  Blood sugar (glucose) can be difficult to control when you are sick. Common illnesses that can cause problems for people with diabetes (diabetes mellitus) include colds, fever, flu (influenza), nausea, vomiting, and diarrhea.  Illnesses can cause stress and loss of body fluids (dehydration), and those issues can cause blood glucose levels to increase.  Make a plan for days when you are sick (sick day plan) as part of your diabetes management plan. You and your health care provider should make this plan in advance.  It is very important to take your insulin and diabetes medicines and to eat some form of carbohydrate when you are sick.  Contact your health care provider if have problems managing your blood glucose levels when you are sick, or if you have been sick or had a fever for 2 days or longer and are  not getting better. This information is not intended to replace advice given to you by your health care provider. Make sure you discuss any questions you have with your health care provider. Document Released: 08/24/2003 Document Revised: 05/19/2016 Document Reviewed: 05/19/2016 Elsevier Interactive Patient Education  [employer].

## 2018-11-07 NOTE — Assessment & Plan Note (Signed)
Advised likely related to URI/flu-like illness.  She tested negative for strep and influenza.  Recommend routine URI care including hydration, rest, regular meals.  If worsening may call our office or present for emergency care.

## 2018-11-07 NOTE — Progress Notes (Signed)
Acute Office Visit  Subjective:    Patient ID: [name], female    DOB: 07/02/1966, 53 y.o.   MRN: [id number]  Chief Complaint  Patient presents with  . Fatigue    HPI Patient is in today for fatigue x2 days (7:00pm Tuesday).  She notes feeling so tired that she is having trouble walking.  She reports sore throat and body aches as well. Denies fevers/chills, abdominal pain, nausea, vomiting, cough, headache, ear pain/pressure, chest pain, shortness of breath.  Yolanda Bonine has had strep throat, and son in law has had flu.    She does have diabetes and BG's have been elevated lately (high 200's to low 300's) - states taking medications as prescribed including sliding scale at meals. Extensive teaching is provided regarding DM sick day care is provided.   Past Medical History:  Diagnosis Date  . Anxiety   . Arthritis   . Asthma   . Bipolar depression ([place])   . CHF (congestive heart failure) ([place])   . Chronic headaches    migraines have lessened  . Chronic kidney disease    ddstage 111  . COPD (chronic obstructive pulmonary disease) ([place])   . Coronary artery disease   . Depression   . Diabetic neuropathy ([place])   . Dyspnea    uses inhalers for this  . Dysrhythmia    rapid  . Fatty liver   . GERD (gastroesophageal reflux disease)   . Headache   . Heart attack ([place]) 2009  . Hyperlipidemia   . Hypertension   . Hypomagnesemia   . Hypothyroid   . Irregular heart rhythm   . Liver lesion    Favored to be benign on CT; MRI of abd w/contract in Aug 2016. Liver Biopsy-Negative, March 2016  . Morbid obesity ([place])   . OSA (obstructive sleep apnea)   . Peripheral vascular disease ([place])   . Pinched nerve    Back  . Pneumonia   . RLS (restless legs syndrome)   . Seasonal allergies   . Sleep apnea    uses oxygen at night 2L d/t cpap did not fit  . Type 2 diabetes, uncontrolled, with neuropathy ([place]) 10/15/2015   Overview:  Microalbumin 7.7 12/2010: Hgb A1c 10.2% 12/2010 and 9.7%  03/2011, eye exam 10/2009 [facility]   . Urinary incontinence   . Vitamin D deficiency     Past Surgical History:  Procedure Laterality Date  . ABDOMINAL HYSTERECTOMY    . ABDOMINAL HYSTERECTOMY     Partial  . bladder stim  2007  . BLADDER SURGERY     x 2. tacking of bladder and the other was the stimulator placement  . CHOLECYSTECTOMY    . INTERSTIM-GENERATOR CHANGE N/A 12/31/2017   Procedure: INTERSTIM-GENERATOR CHANGE;  Surgeon: Bjorn Loser, MD;  Location: [facility] ORS;  Service: Urology;  Laterality: N/A;  . LIVER BIOPSY  March 2016   Negative  . LUNG BIOPSY  2015   small spot on lung  . TUBAL LIGATION      Family History  Problem Relation Age of Onset  . Diabetes Mother   . Heart disease Mother   . Hyperlipidemia Mother   . Hypertension Mother   . Kidney disease Mother   . Mental illness Mother   . Hypothyroidism Mother   . Stroke Mother   . Osteoporosis Mother   . Glaucoma Mother   . Congestive Heart Failure Mother   . Hypertension Father   . Asthma Daughter   .  Cancer Daughter        throat  . Bipolar disorder Daughter   . Heart disease Maternal Grandfather   . Rheum arthritis Maternal Grandfather   . Cancer Maternal Grandfather        liver  . Kidney disease Maternal Grandfather   . Cancer Paternal Grandmother        lung  . Kidney disease Brother   . Breast cancer Maternal Grandmother 60  . Bipolar disorder Daughter   . Hypertension Daughter   . Restless legs syndrome Daughter     Social History   Socioeconomic History  . Marital status: Married    Spouse name: Not on file  . Number of children: 2  . Years of education: 7  . Highest education level: Not on file  Occupational History  . Occupation: disabled  Social Needs  . Financial resource strain: Not hard at all  . Food insecurity:    Worry: Never true    Inability: Never true  . Transportation needs:    Medical: No    Non-medical: No  Tobacco Use  . Smoking status: Never Smoker   . Smokeless tobacco: Never Used  Substance and Sexual Activity  . Alcohol use: No  . Drug use: No  . Sexual activity: Yes    Partners: Male    Birth control/protection: Surgical  Lifestyle  . Physical activity:    Days per week: 0 days    Minutes per session: 0 min  . Stress: Very much  Relationships  . Social connections:    Talks on phone: More than three times a week    Gets together: More than three times a week    Attends religious service: Never    Active member of club or organization: No    Attends meetings of clubs or organizations: Never    Relationship status: Married  . Intimate partner violence:    Fear of current or ex partner: No    Emotionally abused: No    Physically abused: No    Forced sexual activity: No  Other Topics Concern  . Not on file  Social History Narrative   ** Merged History Encounter **       16-20 oz of caffeine a day     Outpatient Medications Prior to Visit  Medication Sig Dispense Refill  . albuterol (PROVENTIL HFA;VENTOLIN HFA) 108 (90 Base) MCG/ACT inhaler Inhale 2 puffs into the lungs every 4 (four) hours as needed for wheezing or shortness of breath. 1 Inhaler 2  . amLODipine (NORVASC) 2.5 MG tablet Take 1 tablet (2.5 mg total) by mouth daily. 30 tablet 1  . atorvastatin (LIPITOR) 80 MG tablet Take 80 mg by mouth at bedtime.     . budesonide-formoterol (SYMBICORT) 160-4.5 MCG/ACT inhaler Inhale 2 puffs into the lungs 2 (two) times daily. 1 Inhaler 11  . cholecalciferol (VITAMIN D) 1000 units tablet Take 1,000 Units by mouth daily.    . DULoxetine (CYMBALTA) 60 MG capsule Take 120 mg by mouth daily.    . empagliflozin (JARDIANCE) 25 MG TABS tablet Take 25 mg by mouth daily.    Marland Kitchen ezetimibe (ZETIA) 10 MG tablet Take 10 mg by mouth daily.     . famotidine (PEPCID) 20 MG tablet Take 1 tablet (20 mg total) by mouth at bedtime. 14 tablet 0  . fenofibrate micronized (LOFIBRA) 134 MG capsule Take 134 mg by mouth at bedtime.     .  fluticasone (FLONASE) 50 MCG/ACT nasal spray Place  2 sprays into both nostrils daily. 16 g 6  . gabapentin (NEURONTIN) 300 MG capsule Take 1 capsule (300 mg total) by mouth 2 (two) times daily. Please schedule appointment to receive further refills. 60 capsule 0  . HUMULIN R U-500 KWIKPEN 500 UNIT/ML kwikpen Inject 70 Units as directed 3 (three) times daily.     . hydrOXYzine (VISTARIL) 50 MG capsule Take 50-100 mg by mouth daily as needed for anxiety.     . insulin aspart (NOVOLOG) 100 UNIT/ML injection Inject 5-25 Units into the skin 3 (three) times daily before meals.     . lamoTRIgine (LAMICTAL) 200 MG tablet Take 200 mg by mouth 2 (two) times daily.     Marland Kitchen levothyroxine (SYNTHROID, LEVOTHROID) 50 MCG tablet One by mouth daily three days a week, one and one-half pills four days a week (Mon, Wed, Fri, Sat for example) 40 tablet 2  . linaclotide (LINZESS) 72 MCG capsule Take 1 capsule (72 mcg total) by mouth daily before breakfast. 90 capsule 3  . lisinopril (PRINIVIL,ZESTRIL) 2.5 MG tablet Take 1 tablet (2.5 mg total) by mouth daily. 30 tablet 1  . metoprolol tartrate (LOPRESSOR) 25 MG tablet Take one tablet 25 mg in the am and 1/2 tab in the pm 45 tablet 6  . ondansetron (ZOFRAN) 4 MG tablet Take 1 tablet (4 mg total) by mouth every 8 (eight) hours as needed for nausea or vomiting. 20 tablet 0  . pantoprazole (PROTONIX) 40 MG tablet Take 1 tablet (40 mg total) by mouth daily. 90 tablet 3  . promethazine (PHENERGAN) 12.5 MG tablet Take 1 tablet (12.5 mg total) by mouth every 8 (eight) hours as needed for nausea or vomiting. 20 tablet 0  . promethazine-dextromethorphan (PROMETHAZINE-DM) 6.25-15 MG/5ML syrup Take 2.5 mLs by mouth 3 (three) times daily as needed. 118 mL 0  . QUEtiapine (SEROQUEL) 400 MG tablet Take 400 mg by mouth at bedtime.    . TRULICITY 1.5 WU/9.8JX SOPN Inject 1.5 mLs as directed once a week.     Marland Kitchen ULTICARE SHORT PEN NEEDLES 31G X 8 MM MISC     . divalproex (DEPAKOTE ER) 250 MG  24 hr tablet divalproex ER 250 mg tablet,extended release 24 hr    . traZODone (DESYREL) 100 MG tablet Take 100 mg by mouth at bedtime.     No facility-administered medications prior to visit.     Allergies  Allergen Reactions  . Mirtazapine Anaphylaxis    REACTION: closes throat  . Morphine And Related     anaphylaxis  . Sulfamethoxazole-Trimethoprim Rash    REACTION: Whelps all over  . Amoxicillin Er Nausea And Vomiting    Has patient had a PCN reaction causing immediate rash, facial/tongue/throat swelling, SOB or lightheadedness with hypotension: No Has patient had a PCN reaction causing severe rash involving mucus membranes or skin necrosis: No Has patient had a PCN reaction that required hospitalization: No Has patient had a PCN reaction occurring within the last 10 years: Yes If all of the above answers are "NO", then may proceed with Cephalosporin use.  . Codeine Itching    REACTION: itch, rash  . Hydrocodone-Acetaminophen Rash    REACTION: rash  . Keflex [Cephalexin] Rash  . Prasterone Nausea And Vomiting    Review of Systems  All other systems reviewed and are negative.     Objective:    Physical Exam  Constitutional: She is oriented to person, place, and time. She appears well-developed and well-nourished. No distress.  HENT:  Head: Normocephalic and atraumatic.  Right Ear: External ear normal.  Left Ear: External ear normal.  Nose: Nose normal.  Mouth/Throat: Oropharynx is clear and moist. No oropharyngeal exudate.  Eyes: Pupils are equal, round, and reactive to light. Conjunctivae and EOM are normal.  Neck: Normal range of motion. Neck supple. No JVD present. No thyromegaly present.  Cardiovascular: Normal rate and regular rhythm. Exam reveals no gallop and no friction rub.  No murmur heard. Pulmonary/Chest: Breath sounds normal. She has no wheezes. She has no rales. She exhibits no tenderness.  Musculoskeletal: Normal range of motion.        General: No  tenderness or edema.  Lymphadenopathy:    She has no cervical adenopathy.  Neurological: She is alert and oriented to person, place, and time. She has normal strength. No cranial nerve deficit or sensory deficit. Coordination (heel to toe ambulation is unsteady, though pt states she was not able to do this well prior to fatigue starting) abnormal. Gait normal.  Skin: Skin is warm and dry. No rash noted.  Psychiatric: She has a normal mood and affect. Her behavior is normal. Judgment and thought content normal.  Nursing note and vitals reviewed.  BP 100/70 (BP Location: Left Arm, Patient Position: Sitting, Cuff Size: Normal)   Pulse 87   Temp 98.8 F (37.1 C) (Oral)   Resp 16   Ht 5' 4"  (1.626 m)   Wt 187 lb 14.4 oz (85.2 kg)   SpO2 93%   BMI 32.25 kg/m  Wt Readings from Last 3 Encounters:  11/07/18 187 lb 14.4 oz (85.2 kg)  10/29/18 185 lb 9.6 oz (84.2 kg)  10/21/18 187 lb (84.8 kg)    Health Maintenance Due  Topic Date Due  . MAMMOGRAM  11/06/2018  . OPHTHALMOLOGY EXAM  10/23/2018    There are no preventive care reminders to display for this patient. Results for orders placed or performed in visit on 11/07/18 (from the past 48 hour(s))  POCT Influenza A/B     Status: Normal   Collection Time: 11/07/18  9:40 AM  Result Value Ref Range   Influenza A, POC Negative Negative   Influenza B, POC Negative Negative  POCT rapid strep A     Status: Normal   Collection Time: 11/07/18  9:42 AM  Result Value Ref Range   Rapid Strep A Screen Negative Negative  POCT urinalysis dipstick     Status: Abnormal   Collection Time: 11/07/18 10:09 AM  Result Value Ref Range   Color, UA Yellow    Clarity, UA Clear    Glucose, UA Positive (A) Negative   Bilirubin, UA Negative    Ketones, UA Negative    Spec Grav, UA 1.015 1.010 - 1.025   Blood, UA Negative    pH, UA 6.5 5.0 - 8.0   Protein, UA Negative Negative   Urobilinogen, UA 0.2 0.2 or 1.0 E.U./dL   Nitrite, UA Negative     Leukocytes, UA Negative Negative   Appearance normal    Odor normal        Assessment & Plan:   Problem List Items Addressed This Visit      Cardiovascular and Mediastinum   Type 2 diabetes with decreased circulation ([place])    Lengthy discussion regarding sick day care for DM. Needs to take novolog with 3 meals a day which she has not been doing.  Urine is negative for ketones.      Relevant Orders   POCT urinalysis dipstick (Completed)  Other   Fatigue - Primary    Advised likely related to URI/flu-like illness.  She tested negative for strep and influenza.  Recommend routine URI care including hydration, rest, regular meals.  If worsening may call our office or present for emergency care.      Relevant Orders   POCT Influenza A/B (Completed)    Other Visit Diagnoses    Body aches       Relevant Orders   POCT Influenza A/B (Completed)   Sore throat       Relevant Orders   POCT rapid strep A (Completed)     No orders of the defined types were placed in this encounter.   Hubbard Hartshorn, FNP

## 2018-11-07 NOTE — Assessment & Plan Note (Signed)
Lengthy discussion regarding sick day care for DM. Needs to take novolog with 3 meals a day which she has not been doing.  Urine is negative for ketones.

## 2018-11-07 NOTE — Telephone Encounter (Signed)
Pt stated that she is still needing her referral (podiatrist) appt but she can not go to [facility] because she has a bill.

## 2018-11-08 ENCOUNTER — Other Ambulatory Visit: Payer: Self-pay | Admitting: Family Medicine

## 2018-11-08 ENCOUNTER — Ambulatory Visit (INDEPENDENT_AMBULATORY_CARE_PROVIDER_SITE_OTHER): Payer: Medicare HMO | Admitting: Family Medicine

## 2018-11-08 ENCOUNTER — Encounter: Payer: Self-pay | Admitting: Family Medicine

## 2018-11-08 VITALS — BP 122/68 | HR 86 | Temp 98.0°F | Resp 16 | Ht 59.0 in | Wt 186.6 lb

## 2018-11-08 DIAGNOSIS — E1151 Type 2 diabetes mellitus with diabetic peripheral angiopathy without gangrene: Secondary | ICD-10-CM | POA: Diagnosis not present

## 2018-11-08 DIAGNOSIS — D649 Anemia, unspecified: Secondary | ICD-10-CM

## 2018-11-08 DIAGNOSIS — R52 Pain, unspecified: Secondary | ICD-10-CM | POA: Diagnosis not present

## 2018-11-08 DIAGNOSIS — R5383 Other fatigue: Secondary | ICD-10-CM | POA: Diagnosis not present

## 2018-11-08 LAB — CBC WITH DIFFERENTIAL/PLATELET
Absolute Monocytes: 525 cells/uL (ref 200–950)
Basophils Absolute: 57 cells/uL (ref 0–200)
Basophils Relative: 0.7 %
Eosinophils Absolute: 303 cells/uL (ref 15–500)
Eosinophils Relative: 3.7 %
HCT: 33.7 % — ABNORMAL LOW (ref 35.0–45.0)
Hemoglobin: 10.8 g/dL — ABNORMAL LOW (ref 11.7–15.5)
LYMPHS ABS: 2452 {cells}/uL (ref 850–3900)
MCH: 29 pg (ref 27.0–33.0)
MCHC: 32 g/dL (ref 32.0–36.0)
MCV: 90.6 fL (ref 80.0–100.0)
MPV: 9.7 fL (ref 7.5–12.5)
Monocytes Relative: 6.4 %
Neutro Abs: 4863 cells/uL (ref 1500–7800)
Neutrophils Relative %: 59.3 %
Platelets: 327 10*3/uL (ref 140–400)
RBC: 3.72 10*6/uL — ABNORMAL LOW (ref 3.80–5.10)
RDW: 13.2 % (ref 11.0–15.0)
Total Lymphocyte: 29.9 %
WBC: 8.2 10*3/uL (ref 3.8–10.8)

## 2018-11-08 LAB — POCT GLUCOSE (DEVICE FOR HOME USE): Glucose Fasting, POC: 306 mg/dL — AB (ref 70–99)

## 2018-11-08 LAB — BASIC METABOLIC PANEL WITH GFR
BUN/Creatinine Ratio: 24 (calc) — ABNORMAL HIGH (ref 6–22)
BUN: 27 mg/dL — ABNORMAL HIGH (ref 7–25)
CALCIUM: 9.7 mg/dL (ref 8.6–10.4)
CO2: 28 mmol/L (ref 20–32)
Chloride: 105 mmol/L (ref 98–110)
Creat: 1.12 mg/dL — ABNORMAL HIGH (ref 0.50–1.05)
GFR, Est African American: 64 mL/min/{1.73_m2} (ref >=60)
GFR, Est Non African American: 55 mL/min/{1.73_m2} — ABNORMAL LOW (ref >=60)
Glucose, Bld: 323 mg/dL — ABNORMAL HIGH (ref 65–99)
POTASSIUM: 5.1 mmol/L (ref 3.5–5.3)
Sodium: 140 mmol/L (ref 135–146)

## 2018-11-08 NOTE — Progress Notes (Signed)
Name: [name]   MRN: [id number]    DOB: 01-18-1966   Date:11/08/2018       Progress Note  Subjective  Chief Complaint  Chief Complaint  Patient presents with  . Follow-up    1 day F/U  . Diarrhea    3 episodes this morning. headaches in the back of her head  . Diabetes    BS has been around 200-400's.   . Fatigue    Nausea and has been taking Zofran for relief.    HPI  Pt in today for 1 day follow up on fatigue.  Her sore throat has improved, but she is now having some diarrhea and nausea that started this morning.  Diarrhea was loose, but not watery, no blood noted.  Does have dry cough. No vomiting, no nasal congestion, chest pain, shortness of breath.  Gait is still unsteady.  BG today is 309.  She did not take her novolog this morning, but did take it yesterday. Fatigue is not any worse than yesterday - did not have ketones in urine yesterday. Flu and strep were both negative.  I do suspect flu-like illness as she is having body aches as well.  Lengthy discussion regarding DM sick day care is provided again today.  Discussed yield of checking BMP and CBC - At this point she is quite fatigued and BG's have been in the 300-400 range for a few days, so I would like to check to determine bacterial infection present and to check electrolytes/anion gap.  Denies polydipsia, polyphagia, or polyuria. Has somewhat dry mouth, but is drinking enough fluids.  Patient Active Problem List   Diagnosis Date Noted  . Type 2 diabetes with decreased circulation ([place]) 11/07/2018  . Vitamin B12 deficiency 08/08/2018  . Encounter for Medicare annual wellness exam 10/30/2017  . Patient is full code 10/30/2017  . Disorder of bursae of shoulder region 10/09/2017  . Coronary artery disease 10/09/2017  . Plantar fascial fibromatosis 10/09/2017  . Tendinitis of wrist 10/09/2017  . Schizoaffective disorder, bipolar type ([place]) 09/28/2017  . Chest pain 09/10/2017  . Lobar pneumonia ([place]) 06/05/2017    . Multifocal pneumonia 05/27/2017  . Pleuritic chest pain 05/27/2017  . Hypertriglyceridemia 12/21/2016  . Pansinusitis 11/15/2016  . 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 Breast cancer screening 09/28/2016  . Urine frequency 09/28/2016  . Preventative health care 09/28/2016  . Fatigue 05/01/2016  . Urinary tract infection 03/14/2016  . Adenomatous colon polyp 10/15/2015  . Airway hyperreactivity 10/15/2015  . Bipolar affective disorder ([place]) 10/15/2015  . CN (constipation) 10/15/2015  . CAFL (chronic airflow limitation) ([place]) 10/15/2015  . Drug-induced hepatic toxicity 10/15/2015  . Blood in feces 10/15/2015  . HLD (hyperlipidemia) 10/15/2015  . Hypoxemia 10/15/2015  . NASH (nonalcoholic steatohepatitis) 10/15/2015  . Morbid obesity ([place]) 10/15/2015  . Restless leg syndrome 10/15/2015  . Type 2 diabetes, uncontrolled, with neuropathy ([place]) 10/15/2015  . Elevated alkaline phosphatase level 09/22/2015  . Urinary bladder incontinence 09/22/2015  . Low back pain with left-sided sciatica 08/18/2015  . Encounter for monitoring tricyclic antidepressant therapy 05/13/2015  . Bipolar disorder ([place]) 05/11/2015  . Liver disease 05/11/2015  . Apnea, sleep 05/05/2015  . Heart valve disease 05/05/2015  . Chronic kidney disease, stage III (moderate) ([facility]) 03/15/2015  . Migraine 03/12/2015  . COPD (chronic obstructive pulmonary disease) ([place]) 01/20/2015  . 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 GERD (gastroesophageal reflux disease) 01/20/2015  . Hypothyroid   .  Vitamin D deficiency   . Urinary incontinence   . Liver lesion   . Hypomagnesemia   . Obstructive sleep apnea 10/20/2013  . Vaginitis 12/19/2011  . PERIPHERAL NEUROPATHY 03/04/2009  . HEMORRHOIDS, EXTERNAL 09/03/2008  . ALLERGIC RHINITIS 07/04/2007  . ASTHMA, PERSISTENT, MODERATE 07/04/2007  . INTERSTITIAL CYSTITIS 07/04/2007  . HYPERTENSION, BENIGN ESSENTIAL 06/19/2007    Social History    Tobacco Use  . Smoking status: Never Smoker  . Smokeless tobacco: Never Used  Substance Use Topics  . Alcohol use: No    Current Outpatient Medications:  .  albuterol (PROVENTIL HFA;VENTOLIN HFA) 108 (90 Base) MCG/ACT inhaler, Inhale 2 puffs into the lungs every 4 (four) hours as needed for wheezing or shortness of breath., Disp: 1 Inhaler, Rfl: 2 .  amLODipine (NORVASC) 2.5 MG tablet, Take 1 tablet (2.5 mg total) by mouth daily., Disp: 30 tablet, Rfl: 1 .  atorvastatin (LIPITOR) 80 MG tablet, Take 80 mg by mouth at bedtime. , Disp: , Rfl:  .  budesonide-formoterol (SYMBICORT) 160-4.5 MCG/ACT inhaler, Inhale 2 puffs into the lungs 2 (two) times daily., Disp: 1 Inhaler, Rfl: 11 .  cholecalciferol (VITAMIN D) 1000 units tablet, Take 1,000 Units by mouth daily., Disp: , Rfl:  .  divalproex (DEPAKOTE ER) 250 MG 24 hr tablet, divalproex ER 250 mg tablet,extended release 24 hr, Disp: , Rfl:  .  DULoxetine (CYMBALTA) 60 MG capsule, Take 120 mg by mouth daily., Disp: , Rfl:  .  empagliflozin (JARDIANCE) 25 MG TABS tablet, Take 25 mg by mouth daily., Disp: , Rfl:  .  ezetimibe (ZETIA) 10 MG tablet, Take 10 mg by mouth daily. , Disp: , Rfl:  .  famotidine (PEPCID) 20 MG tablet, Take 1 tablet (20 mg total) by mouth at bedtime., Disp: 14 tablet, Rfl: 0 .  fenofibrate micronized (LOFIBRA) 134 MG capsule, Take 134 mg by mouth at bedtime. , Disp: , Rfl:  .  fluticasone (FLONASE) 50 MCG/ACT nasal spray, Place 2 sprays into both nostrils daily., Disp: 16 g, Rfl: 6 .  gabapentin (NEURONTIN) 300 MG capsule, Take 1 capsule (300 mg total) by mouth 2 (two) times daily. Please schedule appointment to receive further refills., Disp: 60 capsule, Rfl: 0 .  HUMULIN R U-500 KWIKPEN 500 UNIT/ML kwikpen, Inject 70 Units as directed 3 (three) times daily. , Disp: , Rfl:  .  hydrOXYzine (VISTARIL) 50 MG capsule, Take 50-100 mg by mouth daily as needed for anxiety. , Disp: , Rfl:  .  insulin aspart (NOVOLOG) 100 UNIT/ML  injection, Inject 5-25 Units into the skin 3 (three) times daily before meals. , Disp: , Rfl:  .  lamoTRIgine (LAMICTAL) 200 MG tablet, Take 200 mg by mouth 2 (two) times daily. , Disp: , Rfl:  .  levothyroxine (SYNTHROID, LEVOTHROID) 50 MCG tablet, One by mouth daily three days a week, one and one-half pills four days a week (Mon, Wed, Fri, Sat for example), Disp: 40 tablet, Rfl: 2 .  linaclotide (LINZESS) 72 MCG capsule, Take 1 capsule (72 mcg total) by mouth daily before breakfast., Disp: 90 capsule, Rfl: 3 .  lisinopril (PRINIVIL,ZESTRIL) 2.5 MG tablet, Take 1 tablet (2.5 mg total) by mouth daily., Disp: 30 tablet, Rfl: 1 .  metoprolol tartrate (LOPRESSOR) 25 MG tablet, Take one tablet 25 mg in the am and 1/2 tab in the pm, Disp: 45 tablet, Rfl: 6 .  ondansetron (ZOFRAN) 4 MG tablet, Take 1 tablet (4 mg total) by mouth every 8 (eight) hours as needed for  nausea or vomiting., Disp: 20 tablet, Rfl: 0 .  pantoprazole (PROTONIX) 40 MG tablet, Take 1 tablet (40 mg total) by mouth daily., Disp: 90 tablet, Rfl: 3 .  QUEtiapine (SEROQUEL) 400 MG tablet, Take 400 mg by mouth at bedtime., Disp: , Rfl:  .  traZODone (DESYREL) 100 MG tablet, Take 100 mg by mouth at bedtime., Disp: , Rfl:  .  TRULICITY 1.5 JI/9.6VE SOPN, Inject 1.5 mLs as directed once a week. , Disp: , Rfl:  .  ULTICARE SHORT PEN NEEDLES 31G X 8 MM MISC, , Disp: , Rfl:   Allergies  Allergen Reactions  . Mirtazapine Anaphylaxis    REACTION: closes throat  . Morphine And Related     anaphylaxis  . Sulfamethoxazole-Trimethoprim Rash    REACTION: Whelps all over  . Amoxicillin Er Nausea And Vomiting    Has patient had a PCN reaction causing immediate rash, facial/tongue/throat swelling, SOB or lightheadedness with hypotension: No Has patient had a PCN reaction causing severe rash involving mucus membranes or skin necrosis: No Has patient had a PCN reaction that required hospitalization: No Has patient had a PCN reaction occurring within  the last 10 years: Yes If all of the above answers are "NO", then may proceed with Cephalosporin use.  . Codeine Itching    REACTION: itch, rash  . Hydrocodone-Acetaminophen Rash    REACTION: rash  . Keflex [Cephalexin] Rash  . Prasterone Nausea And Vomiting    I personally reviewed active problem list, medication list, allergies, lab results with the patient/caregiver today.  ROS  Constitutional: Negative for fever or weight change.  Respiratory: Negative for cough and shortness of breath.   Cardiovascular: Negative for chest pain or palpitations.  Gastrointestinal: Negative for abdominal pain, no bowel changes.  Musculoskeletal: Negative for gait problem or joint swelling.  Skin: Negative for rash.  Neurological: Negative for dizziness or headache.  No other specific complaints in a complete review of systems (except as listed in HPI above).  Objective  Vitals:   11/08/18 0901  BP: 122/68  Pulse: 86  Resp: 16  Temp: 98 F (36.7 C)  TempSrc: Oral  SpO2: 97%  Weight: 186 lb 9.6 oz (84.6 kg)  Height: 4' 11"  (1.499 m)   Body mass index is 37.69 kg/m.  Nursing Note and Vital Signs reviewed.  Physical Exam  Constitutional: Patient appears well-developed and well-nourished. Obese. No distress.  HEENT: head atraumatic, normocephalic, pupils equal and reactive to light, Bilateral TM's without erythema or effusion,  bilateral maxillary and frontal sinuses are non-tender, neck supple without lymphadenopathy, throat within normal limits - no erythema or exudate, no tonsillar swelling Cardiovascular: Normal rate, regular rhythm and normal heart sounds.  No murmur heard. No BLE edema. Pulmonary/Chest: Effort normal and breath sounds clear bilaterally. No respiratory distress. Abdominal: Soft, bowel sounds normal, there is no tenderness, no HSM Psychiatric: Patient has a normal mood and affect. behavior is normal. Judgment and thought content normal. Neurological: Pt is alert and  oriented to person, place, and time. No cranial nerve deficit. Coordination, balance, strength, speech and gait are baseline.  Skin: Skin is warm and dry. No rash noted. No erythema.   Results for orders placed or performed in visit on 11/08/18 (from the past 72 hour(s))  POCT Glucose (Device for Home Use)     Status: Abnormal   Collection Time: 11/08/18  9:08 AM  Result Value Ref Range   Glucose Fasting, POC 306 (A) 70 - 99 mg/dL   POC Glucose  Assessment & Plan  1. Fatigue, unspecified type - CBC w/Diff/Platelet - BASIC METABOLIC PANEL WITH GFR  2. Type 2 diabetes with decreased circulation ([facility]) - POCT Glucose (Device for Home Use) - CBC w/Diff/Platelet - BASIC METABOLIC PANEL WITH GFR - Very lengthy discussion regarding sick day management of her DM, trying to eat regular meals and taking her novolog on sliding scale based on pre-meal BG's.  3. Body aches - CBC w/Diff/Platelet - BASIC METABOLIC PANEL WITH GFR  -Red flags and when to present for emergency care or RTC including fever >101.74F, chest pain, shortness of breath, new/worsening/un-resolving symptoms, reviewed with patient at time of visit. Follow up and care instructions discussed and provided in AVS.  Face-to-face time with patient was more than 25 minutes, >50% time spent counseling and coordination of care

## 2018-11-12 ENCOUNTER — Ambulatory Visit (INDEPENDENT_AMBULATORY_CARE_PROVIDER_SITE_OTHER): Payer: Medicare HMO

## 2018-11-12 DIAGNOSIS — N3001 Acute cystitis with hematuria: Secondary | ICD-10-CM

## 2018-11-12 LAB — POCT URINALYSIS DIPSTICK
Bilirubin, UA: NEGATIVE
Glucose, UA: POSITIVE — AB
Ketones, UA: NEGATIVE
Leukocytes, UA: NEGATIVE
Nitrite, UA: NEGATIVE
PH UA: 6 (ref 5.0–8.0)
PROTEIN UA: NEGATIVE
Spec Grav, UA: 1.01 (ref 1.010–1.025)
UROBILINOGEN UA: 0.2 U/dL

## 2018-11-13 ENCOUNTER — Other Ambulatory Visit: Payer: Self-pay | Admitting: Family Medicine

## 2018-11-13 ENCOUNTER — Ambulatory Visit: Payer: Medicare HMO | Admitting: Gastroenterology

## 2018-11-13 DIAGNOSIS — D649 Anemia, unspecified: Secondary | ICD-10-CM

## 2018-11-13 DIAGNOSIS — R319 Hematuria, unspecified: Secondary | ICD-10-CM

## 2018-11-19 ENCOUNTER — Other Ambulatory Visit: Payer: Self-pay

## 2018-11-19 MED ORDER — METOPROLOL TARTRATE 25 MG PO TABS: ORAL_TABLET | ORAL | 0 refills | Status: DC

## 2018-11-21 ENCOUNTER — Ambulatory Visit: Payer: Self-pay | Admitting: *Deleted

## 2018-11-21 NOTE — Telephone Encounter (Signed)
Pt called because she had gotten dizzy this morning and fell. Her husband called the EMS and they checked her b/p, was 140/49. They advised her to call her provider and make an appointment.  No dizziness now. Asked to have her husband to recheck her b/p. Pt was very active and talking, advised her to sit quietly while he checked. The reading was 168/145 with hr of 78. Advised to wait 5 mins and recheck. Next reading was 115/72 HR 86.  She denies any cardiac symptoms. She has already taken her medications this morning. Then she stated that her provider needs to check her toe because she is a diabetic.  She pulled the nail off her right big toe and it tore down to the cuticle. Her toe is painful and swollen. Her foot is red and has a blister on the bottom of her foot. Has an opening on the side of her big toe. She denies fever or leg pain. Hard to walk. Taking tylenol for the pain. Appointment scheduled per pt request for Friday. She has no way of getting there today. Advise to call back for increase in symptoms, dizziness or fever. Routing to [facility].  Reason for Disposition . [1] Has diabetes (diabetes mellitus) AND [2] small cut or scrape  Answer Assessment - Initial Assessment Questions 1. BLOOD PRESSURE: "What is the blood pressure?" "Did you take at least two measurements 5 minutes apart?"     140/49 today by the EMS and now it is 168/145 hr 78 and 5 mins later 115/72 HR 86. 2. ONSET: "When did you take your blood pressure?"     today 3. HOW: "How did you obtain the blood pressure?" (e.g., visiting nurse, automatic home BP monitor)     Automatic home BP monitor checked by her husband (the last 2 b/p's) 4. HISTORY: "Do you have a history of low blood pressure?" "What is your blood pressure normally?"     no 5. MEDICATIONS: "Are you taking any medications for blood pressure?" If yes: "Have they been changed recently?"     Did not take medication this morning, have not  missed any doses 6. PULSE RATE: "Do you know what your pulse rate is?"      See #1 7. OTHER SYMPTOMS: "Have you been sick recently?" "Have you had a recent injury?"     Yes, low immune system,  8. PREGNANCY: "Is there any chance you are pregnant?" "When was your last menstrual period?"     n/a  Answer Assessment - Initial Assessment Questions 1. MECHANISM: "How did the injury happen?"      Pulled the toenail off and went down to the cuticle 2. ONSET: "When did the injury happen?" (Minutes or hours ago)      A month ago 3. LOCATION: "What part of the toe is injured?" "Is the nail damaged?"      Right big toe. It is the nail 4. APPEARANCE of TOE INJURY: "What does the injury look like?"      Has a blister on the bottom of her foot starting with the big toe 5. SEVERITY: "Can you use the foot normally?" "Can you walk?"      Hard to walk 6. SIZE: For cuts, bruises, or swelling, ask: "How large is it?" (e.g., inches or centimeters;  entire toe)      Swelling big toe 7. PAIN: "Is there pain?" If so, ask: "How bad is the pain?"   (e.g., Scale 1-10; or mild, moderate,  severe)     Pain # 7, aching pain 8. TETANUS: For any breaks in the skin, ask: "When was the last tetanus booster?"     Side of the toe is open and have not had a booster that she can remember 9. DIABETES: "Do you have a history of diabetes or poor circulation in the feet?"     Yes  10. OTHER SYMPTOMS: "Do you have any other symptoms?"        no 11. PREGNANCY: "Is there any chance you are pregnant?" "When was your last menstrual period?"       n/a  Protocols used: TOE INJURY-A-AH, LOW BLOOD PRESSURE-A-AH

## 2018-11-21 NOTE — Telephone Encounter (Signed)
Called patient and she stated her blood pressure is now 113/80. Has appointment tomorrow at 2. Patient informed to bring her log of Blood pressure with her

## 2018-11-22 ENCOUNTER — Ambulatory Visit: Payer: Self-pay | Admitting: Family Medicine

## 2018-11-22 ENCOUNTER — Encounter: Payer: Self-pay | Admitting: Family Medicine

## 2018-11-22 ENCOUNTER — Ambulatory Visit (INDEPENDENT_AMBULATORY_CARE_PROVIDER_SITE_OTHER): Payer: Medicare HMO | Admitting: Family Medicine

## 2018-11-22 ENCOUNTER — Telehealth: Payer: Self-pay | Admitting: Family Medicine

## 2018-11-22 ENCOUNTER — Other Ambulatory Visit: Payer: Self-pay

## 2018-11-22 VITALS — BP 118/68 | HR 92 | Temp 98.7°F | Resp 16 | Ht 59.0 in | Wt 189.7 lb

## 2018-11-22 DIAGNOSIS — E1140 Type 2 diabetes mellitus with diabetic neuropathy, unspecified: Secondary | ICD-10-CM

## 2018-11-22 DIAGNOSIS — L03031 Cellulitis of right toe: Secondary | ICD-10-CM | POA: Diagnosis not present

## 2018-11-22 DIAGNOSIS — E1165 Type 2 diabetes mellitus with hyperglycemia: Secondary | ICD-10-CM

## 2018-11-22 DIAGNOSIS — I10 Essential (primary) hypertension: Secondary | ICD-10-CM | POA: Diagnosis not present

## 2018-11-22 DIAGNOSIS — IMO0002 Reserved for concepts with insufficient information to code with codable children: Secondary | ICD-10-CM

## 2018-11-22 MED ORDER — MUPIROCIN 2 % EX OINT: 1.0000 "application " | TOPICAL_OINTMENT | Freq: Two times a day (BID) | CUTANEOUS | 0 refills | Status: DC

## 2018-11-22 MED ORDER — DOXYCYCLINE HYCLATE 100 MG PO TABS: 100.0000 mg | ORAL_TABLET | Freq: Two times a day (BID) | ORAL | 0 refills | Status: AC

## 2018-11-22 NOTE — Telephone Encounter (Signed)
Patient needs podiatrist outside of [facility] to see her for RIGHT great toe injury to nail bed/paronychia.  Was referred to Dr. Cleda Mccreedy in February and wasn't able to go.

## 2018-11-22 NOTE — Patient Instructions (Addendum)
STOP Amlodipine 2.10m.  Check BP once daily and call if above 150/90 or below 110/60.   Paronychia Paronychia is an infection of the skin. It happens near a fingernail or toenail. It may cause pain and swelling around the nail. In some cases, a fluid-filled bump (abscess) can form near or under the nail. Usually, this condition is not serious, and it clears up with treatment. Follow these instructions at home: Wound care  Keep the affected area clean.  Soak the fingers or toes in warm water as told by your doctor. You may be told to do this for 20 minutes, 2-3 times a day.  Keep the area dry when you are not soaking it.  Do not try to drain a fluid-filled bump on your own.  Follow instructions from your doctor about how to take care of the affected area. Make sure you: ? Wash your hands with soap and water before you change your bandage (dressing). If you cannot use soap and water, use hand sanitizer. ? Change your bandage as told by your doctor.  If you had a fluid-filled bump and your doctor drained it, check the area every day for signs of infection. Check for: ? Redness, swelling, or pain. ? Fluid or blood. ? Warmth. ? Pus or a bad smell. Medicines   Take over-the-counter and prescription medicines only as told by your doctor.  If you were prescribed an antibiotic medicine, take it as told by your doctor. Do not stop taking it even if you start to feel better. General instructions  Avoid touching any chemicals.  Do not pick at the affected area. Prevention  To prevent this condition from happening again: ? Wear rubber gloves when putting your hands in water for washing dishes or other tasks. ? Wear gloves if your hands might touch cleaners or chemicals. ? Avoid injuring your nails or fingertips. ? Do not bite your nails or tear hangnails. ? Do not cut your nails very short. ? Do not cut the skin at the base and sides of the nail (cuticles). ? Use clean nail clippers  or scissors when trimming nails. Contact a doctor if:  You feel worse.  You do not get better.  You have more fluid, blood, or pus coming from the affected area.  Your finger or knuckle is swollen or is hard to move. Get help right away if you have:  A fever or chills.  Redness spreading from the affected area.  Pain in a joint or muscle. Summary  Paronychia is an infection of the skin. It happens near a fingernail or toenail.  This condition may cause pain and swelling around the nail.  Soak the fingers or toes in warm water as told by your doctor.  Usually, this condition is not serious, and it clears up with treatment. This information is not intended to replace advice given to you by your health care provider. Make sure you discuss any questions you have with your health care provider. Document Released: 08/09/2009 Document Revised: 09/03/2017 Document Reviewed: 09/03/2017 Elsevier Interactive Patient Education  2019 [place](COVID-19) Are you at risk?  Are you at risk for the Coronavirus (COVID-19)?  To be considered HIGH RISK for Coronavirus (COVID-19), you have to meet the following criteria:  . Traveled to [place] or in the [place] and have fever, cough, and shortness of breath within the last 2 weeks  of travel OR . Been in close contact with a person diagnosed with COVID-19 within the last 2 weeks and have fever, cough, and shortness of breath . IF YOU DO NOT MEET THESE CRITERIA, YOU ARE CONSIDERED LOW RISK FOR COVID-19.  What to do if you are HIGH RISK for COVID-19?  Marland Kitchen If you are having a medical emergency, call 911. . Seek medical care right away. Before you go to a doctor's office, urgent care or emergency department, call ahead and tell them about your recent travel, contact with someone diagnosed with COVID-19, and your symptoms. You should receive  instructions from your physician's office regarding next steps of care.  . When you arrive at healthcare provider, tell the healthcare staff immediately you have returned from visiting [place], [place], [place], [place] or [place]; or traveled in the [place] to [place], [place], [place], or [place]; in the last two weeks or you have been in close contact with a person diagnosed with COVID-19 in the last 2 weeks.   . Tell the health care staff about your symptoms: fever, cough and shortness of breath. . After you have been seen by a medical provider, you will be either: o Tested for (COVID-19) and discharged home on quarantine except to seek medical care if symptoms worsen, and asked to  - Stay home and avoid contact with others until you get your results (4-5 days)  - Avoid travel on public transportation if possible (such as bus, train, or airplane) or o Sent to the Emergency Department by EMS for evaluation, COVID-19 testing, and possible admission depending on your condition and test results.  What to do if you are LOW RISK for COVID-19?  Reduce your risk of any infection by using the same precautions used for avoiding the common cold or flu:  Marland Kitchen Wash your hands often with soap and warm water for at least 20 seconds.  If soap and water are not readily available, use an alcohol-based hand sanitizer with at least 60% alcohol.  . If coughing or sneezing, cover your mouth and nose by coughing or sneezing into the elbow areas of your shirt or coat, into a tissue or into your sleeve (not your hands). . Avoid shaking hands with others and consider head nods or verbal greetings only. . Avoid touching your eyes, nose, or mouth with unwashed hands.  . Avoid close contact with people who are sick. . Avoid places or events with large numbers of people in one location, like concerts or sporting events. . Carefully consider travel plans you have or are making. . If you are planning any travel  outside or inside the [place], visit the CDC's Travelers' Health webpage for the latest health notices. . If you have some symptoms but not all symptoms, continue to monitor at home and seek medical attention if your symptoms worsen. . If you are having a medical emergency, call 911.   [place] / e-Visit: [email or web address]         [facility]: [facility]: [phone]                   [facility]: [phone]

## 2018-11-22 NOTE — Assessment & Plan Note (Signed)
STOP amlodipine 2.37m - she is on low end of normal today.

## 2018-11-22 NOTE — Progress Notes (Signed)
Name: [name]   MRN: [id number]    DOB: [birth year]   Date:11/22/2018       Progress Note  Subjective  Chief Complaint  Chief Complaint  Patient presents with  . Hypertension    Bp running low  . Ingrown Toenail    right big toe swollen red    HPI  PT presents with concern for RIGHT toe pain - she pulled a part of the cuticle off 1 week ago and developed redness and swelling around the cuticle on the medial aspect of the RIGHT great toe.  She denies fevers, chills, no red streaking.  She has similar incident 10/21/2018 with [place] NP-C and this resolved on its own, though she was referred to podiatry, she was not able to go.   BP Check: Notes concern for hypotension as she had some dizziness yesterday. BP is low end of normal in office today. Reviewed her medications - we will d/c 2.61m of Amlodipine and have her monitor at home - schedule BP follow up in 2 weeks.  DM: BG's are consistently out of control.  Discussed that this puts her at increased risk for complex infections due to her poorly controlled DM.    Patient Active Problem List   Diagnosis Date Noted  . Type 2 diabetes with decreased circulation ([place] 11/07/2018  . Vitamin B12 deficiency 08/08/2018  . Encounter for Medicare annual wellness exam 10/30/2017  . Patient is full code 10/30/2017  . Disorder of bursae of shoulder region 10/09/2017  . Coronary artery disease 10/09/2017  . Plantar fascial fibromatosis 10/09/2017  . Tendinitis of wrist 10/09/2017  . Schizoaffective disorder, bipolar type ([place] 09/28/2017  . Chest pain 09/10/2017  . Lobar pneumonia (HR[place] 06/05/2017  . Multifocal pneumonia 05/27/2017  . Pleuritic chest pain 05/27/2017  . Hypertriglyceridemia 12/21/2016  . Pansinusitis 11/15/2016  . 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 Breast cancer screening 09/28/2016  . Urine frequency 09/28/2016  . Preventative health care 09/28/2016  . Fatigue 05/01/2016  . Urinary tract  infection 03/14/2016  . Adenomatous colon polyp 10/15/2015  . Airway hyperreactivity 10/15/2015  . Bipolar affective disorder (HEnc[place] 10/15/2015  . CN (constipation) 10/15/2015  . CAFL (chronic airflow limitation) (HH[place] 10/15/2015  . Drug-induced hepatic toxicity 10/15/2015  . Blood in feces 10/15/2015  . HLD (hyperlipidemia) 10/15/2015  . Hypoxemia 10/15/2015  . NASH (nonalcoholic steatohepatitis) 10/15/2015  . Morbid obesity (HSaugerties [place] 10/15/2015  . Restless leg syndrome 10/15/2015  . Type 2 diabetes, uncontrolled, with neuropathy ([place] 10/15/2015  . Elevated alkaline phosphatase level 09/22/2015  . Urinary bladder incontinence 09/22/2015  . Low back pain with left-sided sciatica 08/18/2015  . Encounter for monitoring tricyclic antidepressant therapy 05/13/2015  . Bipolar disorder ([place]ie[place] 05/11/2015  . Liver disease 05/11/2015  . Apnea, sleep 05/05/2015  . Heart valve disease 05/05/2015  . Chronic kidney disease, stage III (moderate) (HCC) 03/15/2015  . Migraine 03/12/2015  . COPD (chronic obstructive pulmonary disease) (HPiedmont 01/20/2015  . 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HH[place] 01/20/2015  . GERD (gastroesophageal reflux disease) 01/20/2015  . Hypothyroid   . Vitamin D deficiency   . Urinary incontinence   . Liver lesion   . Hypomagnesemia   . Obstructive sleep apnea 10/20/2013  . Vaginitis 12/19/2011  . PERIPHERAL NEUROPATHY 03/04/2009  . HEMORRHOIDS, EXTERNAL 09/03/2008  . ALLERGIC RHINITIS 07/04/2007  . ASTHMA, PERSISTENT, MODERATE 07/04/2007  . INTERSTITIAL CYSTITIS 07/04/2007  . HYPERTENSION, BENIGN ESSENTIAL 06/19/2007    Social History  Tobacco Use  . Smoking status: Never Smoker  . Smokeless tobacco: Never Used  Substance Use Topics  . Alcohol use: No     Current Outpatient Medications:  .  albuterol (PROVENTIL HFA;VENTOLIN HFA) 108 (90 Base) MCG/ACT inhaler, Inhale 2 puffs into the lungs every 4 (four) hours as needed for wheezing or shortness of breath., Disp: 1  Inhaler, Rfl: 2 .  amLODipine (NORVASC) 2.5 MG tablet, Take 1 tablet (2.5 mg total) by mouth daily., Disp: 30 tablet, Rfl: 1 .  atorvastatin (LIPITOR) 80 MG tablet, Take 80 mg by mouth at bedtime. , Disp: , Rfl:  .  budesonide-formoterol (SYMBICORT) 160-4.5 MCG/ACT inhaler, Inhale 2 puffs into the lungs 2 (two) times daily., Disp: 1 Inhaler, Rfl: 11 .  cholecalciferol (VITAMIN D) 1000 units tablet, Take 1,000 Units by mouth daily., Disp: , Rfl:  .  divalproex (DEPAKOTE ER) 250 MG 24 hr tablet, divalproex ER 250 mg tablet,extended release 24 hr, Disp: , Rfl:  .  DULoxetine (CYMBALTA) 60 MG capsule, Take 120 mg by mouth daily., Disp: , Rfl:  .  empagliflozin (JARDIANCE) 25 MG TABS tablet, Take 25 mg by mouth daily., Disp: , Rfl:  .  ezetimibe (ZETIA) 10 MG tablet, Take 10 mg by mouth daily. , Disp: , Rfl:  .  famotidine (PEPCID) 20 MG tablet, Take 1 tablet (20 mg total) by mouth at bedtime., Disp: 14 tablet, Rfl: 0 .  fenofibrate micronized (LOFIBRA) 134 MG capsule, Take 134 mg by mouth at bedtime. , Disp: , Rfl:  .  fluticasone (FLONASE) 50 MCG/ACT nasal spray, Place 2 sprays into both nostrils daily., Disp: 16 g, Rfl: 6 .  gabapentin (NEURONTIN) 300 MG capsule, Take 1 capsule (300 mg total) by mouth 2 (two) times daily. Please schedule appointment to receive further refills., Disp: 60 capsule, Rfl: 0 .  HUMULIN R U-500 KWIKPEN 500 UNIT/ML kwikpen, Inject 70 Units as directed 3 (three) times daily. , Disp: , Rfl:  .  hydrOXYzine (VISTARIL) 50 MG capsule, Take 50-100 mg by mouth daily as needed for anxiety. , Disp: , Rfl:  .  insulin aspart (NOVOLOG) 100 UNIT/ML injection, Inject 5-25 Units into the skin 3 (three) times daily before meals. , Disp: , Rfl:  .  lamoTRIgine (LAMICTAL) 200 MG tablet, Take 200 mg by mouth 2 (two) times daily. , Disp: , Rfl:  .  levothyroxine (SYNTHROID, LEVOTHROID) 50 MCG tablet, One by mouth daily three days a week, one and one-half pills four days a week (Mon, Wed, Fri,  Sat for example), Disp: 40 tablet, Rfl: 2 .  linaclotide (LINZESS) 72 MCG capsule, Take 1 capsule (72 mcg total) by mouth daily before breakfast., Disp: 90 capsule, Rfl: 3 .  lisinopril (PRINIVIL,ZESTRIL) 2.5 MG tablet, Take 1 tablet (2.5 mg total) by mouth daily., Disp: 30 tablet, Rfl: 1 .  metoprolol tartrate (LOPRESSOR) 25 MG tablet, Take one tablet 25 mg in the am and 1/2 tab in the pm, Disp: 45 tablet, Rfl: 0 .  ondansetron (ZOFRAN) 4 MG tablet, Take 1 tablet (4 mg total) by mouth every 8 (eight) hours as needed for nausea or vomiting., Disp: 20 tablet, Rfl: 0 .  pantoprazole (PROTONIX) 40 MG tablet, Take 1 tablet (40 mg total) by mouth daily., Disp: 90 tablet, Rfl: 3 .  QUEtiapine (SEROQUEL) 400 MG tablet, Take 400 mg by mouth at bedtime., Disp: , Rfl:  .  traZODone (DESYREL) 100 MG tablet, Take 100 mg by mouth at bedtime., Disp: , Rfl:  .  TRULICITY 1.5 TZ/0.0FV SOPN, Inject 1.5 mLs as directed once a week. , Disp: , Rfl:  .  ULTICARE SHORT PEN NEEDLES 31G X 8 MM MISC, , Disp: , Rfl:   Allergies  Allergen Reactions  . Mirtazapine Anaphylaxis    REACTION: closes throat  . Morphine And Related     anaphylaxis  . Sulfamethoxazole-Trimethoprim Rash    REACTION: Whelps all over  . Amoxicillin Er Nausea And Vomiting    Has patient had a PCN reaction causing immediate rash, facial/tongue/throat swelling, SOB or lightheadedness with hypotension: No Has patient had a PCN reaction causing severe rash involving mucus membranes or skin necrosis: No Has patient had a PCN reaction that required hospitalization: No Has patient had a PCN reaction occurring within the last 10 years: Yes If all of the above answers are "NO", then may proceed with Cephalosporin use.  . Codeine Itching    REACTION: itch, rash  . Hydrocodone-Acetaminophen Rash    REACTION: rash  . Keflex [Cephalexin] Rash  . Prasterone Nausea And Vomiting    I personally reviewed active problem list, medication list, allergies,  health maintenance, notes from last encounter, lab results with the patient/caregiver today.  ROS  Ten systems reviewed and is negative except as mentioned in HPI  Objective  Vitals:   11/22/18 1357  BP: 118/68  Pulse: 92  Resp: 16  Temp: 98.7 F (37.1 C)  TempSrc: Oral  SpO2: 98%  Weight: 189 lb 11.2 oz (86 kg)  Height: 4' 11"  (1.499 m)   Body mass index is 38.31 kg/m.  Nursing Note and Vital Signs reviewed.  Physical Exam  Constitutional: Patient appears well-developed and well-nourished. Obese. No distress.  HEENT: head atraumatic, normocephalic, pupils equal and reactive to light, Bilateral TM's without erythema or effusion,  bilateral maxillary and frontal sinuses are non-tender, neck supple without lymphadenopathy, throat within normal limits - no erythema or exudate, no tonsillar swelling Cardiovascular: Normal rate, regular rhythm and normal heart sounds.  No murmur heard. No BLE edema. Pulmonary/Chest: Effort normal and breath sounds clear bilaterally. No respiratory distress. Abdominal: Soft, bowel sounds normal, there is no tenderness, no HSM Psychiatric: Patient has a normal mood and affect. behavior is normal. Judgment and thought content normal. Skin: RIGHT great toe exhibits tenderness, edema, cuticle has been removed from the medial aspect and is exquisitely tender with fluctuance.  There is a very small area of exposed flesh to the right medial aspect.   No results found for this or any previous visit (from the past 72 hour(s)).  Assessment & Plan  1. Paronychia of great toe of right foot - doxycycline (VIBRA-TABS) 100 MG tablet; Take 1 tablet (100 mg total) by mouth 2 (two) times daily for 10 days.  Dispense: 20 tablet; Refill: 0 - mupirocin ointment (BACTROBAN) 2 %; Place 1 application into the nose 2 (two) times daily.  Dispense: 22 g; Refill: 0  2. HYPERTENSION, BENIGN ESSENTIAL - STOP Amlodipine for the time beibg.  3. Type 2 diabetes, uncontrolled,  with neuropathy ([place]) - Discussed increased risk for osteomyelitis and other complex infections. We will hold off on I&D of toe and rx antibiotics as above.  -Red flags and when to present for emergency care or RTC including fever >101.23F, chest pain, shortness of breath, new/worsening/un-resolving symptoms, reviewed with patient at time of visit. Follow up and care instructions discussed and provided in AVS.

## 2018-11-29 ENCOUNTER — Ambulatory Visit: Payer: Medicare HMO | Admitting: Podiatry

## 2018-12-02 ENCOUNTER — Telehealth: Payer: Medicare HMO | Admitting: Gastroenterology

## 2018-12-04 ENCOUNTER — Other Ambulatory Visit: Payer: Self-pay

## 2018-12-04 ENCOUNTER — Ambulatory Visit (INDEPENDENT_AMBULATORY_CARE_PROVIDER_SITE_OTHER): Payer: Medicare HMO | Admitting: Gastroenterology

## 2018-12-04 ENCOUNTER — Encounter: Payer: Self-pay | Admitting: Gastroenterology

## 2018-12-04 DIAGNOSIS — K5904 Chronic idiopathic constipation: Secondary | ICD-10-CM

## 2018-12-04 DIAGNOSIS — D500 Iron deficiency anemia secondary to blood loss (chronic): Secondary | ICD-10-CM | POA: Diagnosis not present

## 2018-12-04 DIAGNOSIS — R748 Abnormal levels of other serum enzymes: Secondary | ICD-10-CM | POA: Diagnosis not present

## 2018-12-04 NOTE — Progress Notes (Signed)
[name], MD [address]  [facility]  [place], [place] [address]  Main: [phone]  Fax: [phone]   Primary Care Physician: Arnetha Courser, MD  Virtual Visit via Telephone Note  I connected with patient on 12/04/18 at 11:50 AM EDT by telephone and verified that I am speaking with the correct person using two identifiers.   I discussed the limitations, risks, security and privacy concerns of performing an evaluation and management service by telephone and the availability of in person appointments. I also discussed with the patient that there may be a patient responsible charge related to this service. The patient expressed understanding and agreed to proceed.  Location of Patient: Home Location of Provider: Office Persons involved: Patient, myself and Ginger Feldpausch.   History of Present Illness:  HPI: [name] is a 53 y.o. female this patient is seen me in the past for fatty liver and abnormal liver enzymes.  The patient had a liver biopsy in the past that showed steatosis.  The patient also had a colonoscopy by me back in 2015 that showed her to have 2 adenomatous polyps with a recommended repeat colonoscopy in 5 years.  The patient now denies any acute problems but was found to have a decreased hemoglobin and hematocrit.  She was referred back to me for evaluation of anemia.  There is no report of any unexplained weight loss fevers chills nausea or vomiting.  The patient does take Linzess but states that it had not been refilled and she is now very constipated.  The patient is usually well controlled on 72 mcg of Linzess per day.  Current Outpatient Medications  Medication Sig Dispense Refill  . albuterol (PROVENTIL HFA;VENTOLIN HFA) 108 (90 Base) MCG/ACT inhaler Inhale 2 puffs into the lungs every 4 (four) hours as needed for wheezing or shortness of breath. 1 Inhaler 2  . atorvastatin (LIPITOR) 80 MG tablet Take 80 mg by mouth at bedtime.     .  budesonide-formoterol (SYMBICORT) 160-4.5 MCG/ACT inhaler Inhale 2 puffs into the lungs 2 (two) times daily. 1 Inhaler 11  . cholecalciferol (VITAMIN D) 1000 units tablet Take 1,000 Units by mouth daily.    . divalproex (DEPAKOTE ER) 250 MG 24 hr tablet divalproex ER 250 mg tablet,extended release 24 hr    . DULoxetine (CYMBALTA) 60 MG capsule Take 120 mg by mouth daily.    . empagliflozin (JARDIANCE) 25 MG TABS tablet Take 25 mg by mouth daily.    Marland Kitchen ezetimibe (ZETIA) 10 MG tablet Take 10 mg by mouth daily.     . famotidine (PEPCID) 20 MG tablet Take 1 tablet (20 mg total) by mouth at bedtime. 14 tablet 0  . fenofibrate micronized (LOFIBRA) 134 MG capsule Take 134 mg by mouth at bedtime.     . fluticasone (FLONASE) 50 MCG/ACT nasal spray Place 2 sprays into both nostrils daily. 16 g 6  . gabapentin (NEURONTIN) 300 MG capsule Take 1 capsule (300 mg total) by mouth 2 (two) times daily. Please schedule appointment to receive further refills. 60 capsule 0  . HUMULIN R U-500 KWIKPEN 500 UNIT/ML kwikpen Inject 70 Units as directed 3 (three) times daily.     . hydrOXYzine (VISTARIL) 50 MG capsule Take 50-100 mg by mouth daily as needed for anxiety.     . insulin aspart (NOVOLOG) 100 UNIT/ML injection Inject 5-25 Units into the skin 3 (three) times daily before meals.     . lamoTRIgine (LAMICTAL) 200 MG tablet  Take 200 mg by mouth 2 (two) times daily.     Marland Kitchen levothyroxine (SYNTHROID, LEVOTHROID) 50 MCG tablet One by mouth daily three days a week, one and one-half pills four days a week (Mon, Wed, Fri, Sat for example) 40 tablet 2  . linaclotide (LINZESS) 72 MCG capsule Take 1 capsule (72 mcg total) by mouth daily before breakfast. 90 capsule 3  . lisinopril (PRINIVIL,ZESTRIL) 2.5 MG tablet Take 1 tablet (2.5 mg total) by mouth daily. 30 tablet 1  . metoprolol tartrate (LOPRESSOR) 25 MG tablet Take one tablet 25 mg in the am and 1/2 tab in the pm 45 tablet 0  . mupirocin ointment (BACTROBAN) 2 % Place 1  application into the nose 2 (two) times daily. 22 g 0  . ondansetron (ZOFRAN) 4 MG tablet Take 1 tablet (4 mg total) by mouth every 8 (eight) hours as needed for nausea or vomiting. 20 tablet 0  . pantoprazole (PROTONIX) 40 MG tablet Take 1 tablet (40 mg total) by mouth daily. 90 tablet 3  . QUEtiapine (SEROQUEL) 400 MG tablet Take 400 mg by mouth at bedtime.    . traZODone (DESYREL) 100 MG tablet Take 100 mg by mouth at bedtime.    . TRULICITY 1.5 TM/5.4YT SOPN Inject 1.5 mLs as directed once a week.     Marland Kitchen ULTICARE SHORT PEN NEEDLES 31G X 8 MM MISC      No current facility-administered medications for this visit.     Allergies as of 12/04/2018 - Review Complete 11/22/2018  Allergen Reaction Noted  . Mirtazapine Anaphylaxis   . Morphine and related  10/20/2013  . Sulfamethoxazole-trimethoprim Rash 06/09/2008  . Amoxicillin er Nausea And Vomiting 12/24/2017  . Codeine Itching   . Hydrocodone-acetaminophen Rash   . Keflex [cephalexin] Rash 07/04/2018  . Prasterone Nausea And Vomiting 12/24/2017    Review of Systems:    All systems reviewed and negative except where noted in HPI.   Observations/Objective:  Labs: CMP     Component Value Date/Time   NA 140 11/08/2018 0932   NA 145 (H) 09/22/2015 1339   NA 133 (L) 10/09/2014 1808   K 5.1 11/08/2018 0932   K 4.7 10/09/2014 1808   CL 105 11/08/2018 0932   CL 97 (L) 10/09/2014 1808   CO2 28 11/08/2018 0932   CO2 24 10/09/2014 1808   GLUCOSE 323 (H) 11/08/2018 0932   GLUCOSE 391 (H) 10/09/2014 1808   BUN 27 (H) 11/08/2018 0932   BUN 12 09/22/2015 1339   BUN 24 (H) 10/09/2014 1808   CREATININE 1.12 (H) 11/08/2018 0932   CALCIUM 9.7 11/08/2018 0932   CALCIUM 10.0 10/09/2014 1808   PROT 6.3 10/04/2018 1103   PROT 6.5 09/22/2015 1339   PROT 6.8 12/21/2014 1234   ALBUMIN 3.9 07/14/2017 1432   ALBUMIN 4.3 09/22/2015 1339   ALBUMIN 3.8 12/21/2014 1234   AST 39 (H) 10/04/2018 1103   AST 51 (H) 12/21/2014 1234   ALT 44 (H)  10/04/2018 1103   ALT 63 (H) 12/21/2014 1234   ALKPHOS 89 07/14/2017 1432   ALKPHOS 87 12/21/2014 1234   BILITOT 0.4 10/04/2018 1103   BILITOT <0.2 09/22/2015 1339   BILITOT 0.4 12/21/2014 1234   GFRNONAA 55 (L) 11/08/2018 0932   GFRAA 64 11/08/2018 0932   Lab Results  Component Value Date   WBC 8.2 11/08/2018   HGB 10.8 (L) 11/08/2018   HCT 33.7 (L) 11/08/2018   MCV 90.6 11/08/2018   PLT 327 11/08/2018  Imaging Studies: No results found.  Assessment and Plan:   [name] is a 53 y.o. y/o female who is seen me in the past for abdominal discomfort and abnormal liver enzymes with the finding of fatty liver.  The patient has a history of obesity and fatty liver on liver biopsy.  The patient has recently been found to have anemia and has a history of colon polyps.  The patient will be set up for an EGD and colonoscopy after the pandemic has resolved and elective procedures can be done.  The patient will also have her Linzess refilled so that her constipation is no longer bothering her.  The patient has been explained the plan and agrees with it.    Follow Up Instructions:    I discussed the assessment and treatment plan with the patient. The patient was provided an opportunity to ask questions and all were answered. The patient agreed with the plan and demonstrated an understanding of the instructions.   The patient was advised to call back or seek an in-person evaluation if the symptoms worsen or if the condition fails to improve as anticipated.  I provided 10 minutes of non-face-to-face time during this encounter.   [name], MD  Speech recognition software was used to dictate this note.

## 2018-12-11 ENCOUNTER — Ambulatory Visit: Payer: Self-pay | Admitting: Urology

## 2018-12-16 ENCOUNTER — Other Ambulatory Visit: Payer: Self-pay

## 2018-12-16 MED ORDER — METOPROLOL TARTRATE 25 MG PO TABS: ORAL_TABLET | ORAL | 0 refills | Status: DC

## 2018-12-16 NOTE — Telephone Encounter (Signed)
Requested Prescriptions   Signed Prescriptions Disp Refills  . metoprolol tartrate (LOPRESSOR) 25 MG tablet 45 tablet 0    Sig: Take one tablet 25 mg in the am and 1/2 tab in the pm    Authorizing Provider: Minna Merritts    Ordering User: Janan Ridge

## 2018-12-17 ENCOUNTER — Other Ambulatory Visit: Payer: Self-pay

## 2018-12-17 MED ORDER — LISINOPRIL 2.5 MG PO TABS: 2.5000 mg | ORAL_TABLET | Freq: Every day | ORAL | 1 refills | Status: DC

## 2018-12-17 NOTE — Telephone Encounter (Signed)
Lab Results  Component Value Date   CREATININE 1.12 (H) 11/08/2018   Lab Results  Component Value Date   K 5.1 11/08/2018

## 2018-12-19 ENCOUNTER — Other Ambulatory Visit: Payer: Self-pay

## 2018-12-20 NOTE — Telephone Encounter (Signed)
Zetia in the med list shows historical provider from 2018 No recent record of that coming from here Note sent to pharmacy

## 2018-12-24 ENCOUNTER — Other Ambulatory Visit: Payer: Self-pay

## 2018-12-24 ENCOUNTER — Ambulatory Visit (INDEPENDENT_AMBULATORY_CARE_PROVIDER_SITE_OTHER): Payer: Medicare HMO | Admitting: Family Medicine

## 2018-12-24 ENCOUNTER — Encounter: Payer: Self-pay | Admitting: Family Medicine

## 2018-12-24 DIAGNOSIS — D803 Selective deficiency of immunoglobulin G [IgG] subclasses: Secondary | ICD-10-CM

## 2018-12-24 DIAGNOSIS — I10 Essential (primary) hypertension: Secondary | ICD-10-CM

## 2018-12-24 DIAGNOSIS — E1151 Type 2 diabetes mellitus with diabetic peripheral angiopathy without gangrene: Secondary | ICD-10-CM | POA: Diagnosis not present

## 2018-12-24 NOTE — Assessment & Plan Note (Signed)
Followed by Dr. Francoise Schaumann; check feet really well every night

## 2018-12-24 NOTE — Progress Notes (Signed)
There were no vitals taken for this visit.   Subjective:    Patient ID: [name], female    DOB: Nov 17, 1965, 53 y.o.   MRN: [id number]  HPI: [name] is a 53 y.o. female  Chief Complaint  Patient presents with  . Follow-up    HPI Virtual Visit via Telephone/Video Note   I connected with the patient via:  ttelephone I verified that I am speaking with the correct person using two identifiers.  Call started: 11:47 am Call terminated: 11:55 am Total length of call: 8 minutes and 44 seconds   I discussed the limitations, risks, and privacy concerns of performing an evaluation and management service by telephone and the availability of in-person appointments. I explained that he/she may be responsible for charges related to this service. The patient expressed understanding and agreed to proceed.  Provider location: home, upstairs office with door closed, earphones/headset on Patient location: home Additional participants: no one  She has not been exposed to COVID-19; no fever, cough, or SHOB  She saw Dr. Allen Norris a few weeks ago; iron deficiency anemia; he is going to do a colonoscopy and EGD; he has to wait until after the virus clears up  She saw Dr. Francoise Schaumann on Friday and had all her bloodwork; she will see him next Friday; she'll ask his office to send [place] copies of the labs  She is going to see the doctor for her immune system on April 28th; hx of recurrent infections and she has low IgG  Last visit at [facility] was for an infected toenail; all better now; she is keeping a good eye on that with her diabetes; managed by endo; her sugars go up and down depending on what she eats; finished antibiotics and no diarrhea  She had her amlodipine stopped and feels better; no more dizziness, "no more running into walls"; she says her husband checks her BP usually but he's not here   Depression screen [facility] 2/9 11/22/2018 11/07/2018 10/29/2018 10/21/2018 10/04/2018  Decreased  Interest 0 2 0 0 0  Down, Depressed, Hopeless 0 0 0 0 0  PHQ - 2 Score 0 2 0 0 0  Altered sleeping 0 2 0 0 0  Tired, decreased energy 0 2 0 3 3  Change in appetite - 1 0 0 0  Feeling bad or failure about yourself  0 0 0 0 0  Trouble concentrating 0 1 0 0 0  Moving slowly or fidgety/restless 0 3 0 0 0  Suicidal thoughts 0 0 - 0 0  PHQ-9 Score 0 11 0 3 3  Difficult doing work/chores Not difficult at all Somewhat difficult Not difficult at all Not difficult at all Not difficult at all  Some recent data might be hidden   Fall Risk  12/24/2018 11/22/2018 11/07/2018 10/29/2018 10/21/2018  Falls in the past year? 1 1 0 0 0  Comment - - - - -  Number falls in past yr: - 1 0 0 0  Comment - - - - -  Injury with Fall? - 0 0 0 0  Comment - - - - -  Risk for fall due to : - - - - -  Follow up - Falls evaluation completed - Falls evaluation completed -    Relevant past medical, surgical, family and social history reviewed Past Medical History:  Diagnosis Date  . Anxiety   . Arthritis   . Asthma   . Bipolar depression ([place])   .  CHF (congestive heart failure) ([place])   . Chronic headaches    migraines have lessened  . Chronic kidney disease    ddstage 111  . COPD (chronic obstructive pulmonary disease) ([place])   . Coronary artery disease   . Depression   . Diabetic neuropathy ([place])   . Dyspnea    uses inhalers for this  . Dysrhythmia    rapid  . Fatty liver   . GERD (gastroesophageal reflux disease)   . Headache   . Heart attack ([place]) 2009  . Hyperlipidemia   . Hypertension   . Hypomagnesemia   . Hypothyroid   . Irregular heart rhythm   . Liver lesion    Favored to be benign on CT; MRI of abd w/contract in Aug 2016. Liver Biopsy-Negative, March 2016  . Morbid obesity ([place])   . OSA (obstructive sleep apnea)   . Peripheral vascular disease ([place])   . Pinched nerve    Back  . Pneumonia   . RLS (restless legs syndrome)   . Seasonal allergies   . Sleep apnea    uses oxygen at night  2L d/t cpap did not fit  . Type 2 diabetes, uncontrolled, with neuropathy ([place]) 10/15/2015   Overview:  Microalbumin 7.7 12/2010: Hgb A1c 10.2% 12/2010 and 9.7% 03/2011, eye exam 10/2009 [facility] DM clinic   . Urinary incontinence   . Vitamin D deficiency    Past Surgical History:  Procedure Laterality Date  . ABDOMINAL HYSTERECTOMY    . ABDOMINAL HYSTERECTOMY     Partial  . bladder stim  2007  . BLADDER SURGERY     x 2. tacking of bladder and the other was the stimulator placement  . CHOLECYSTECTOMY    . INTERSTIM-GENERATOR CHANGE N/A 12/31/2017   Procedure: INTERSTIM-GENERATOR CHANGE;  Surgeon: Bjorn Loser, MD;  Location: [facility] ORS;  Service: Urology;  Laterality: N/A;  . LIVER BIOPSY  March 2016   Negative  . LUNG BIOPSY  2015   small spot on lung  . TUBAL LIGATION     Family History  Problem Relation Age of Onset  . Diabetes Mother   . Heart disease Mother   . Hyperlipidemia Mother   . Hypertension Mother   . Kidney disease Mother   . Mental illness Mother   . Hypothyroidism Mother   . Stroke Mother   . Osteoporosis Mother   . Glaucoma Mother   . Congestive Heart Failure Mother   . Hypertension Father   . Asthma Daughter   . Cancer Daughter        throat  . Bipolar disorder Daughter   . Heart disease Maternal Grandfather   . Rheum arthritis Maternal Grandfather   . Cancer Maternal Grandfather        liver  . Kidney disease Maternal Grandfather   . Cancer Paternal Grandmother        lung  . Kidney disease Brother   . Breast cancer Maternal Grandmother 60  . Bipolar disorder Daughter   . Hypertension Daughter   . Restless legs syndrome Daughter    Social History   Tobacco Use  . Smoking status: Never Smoker  . Smokeless tobacco: Never Used  Substance Use Topics  . Alcohol use: No  . Drug use: No     Office Visit from 12/24/2018 in [facility]  AUDIT-C Score  0      Interim medical history since last visit reviewed. Allergies and  medications reviewed  Review of Systems Per HPI  unless specifically indicated above     Objective:    There were no vitals taken for this visit.  Wt Readings from Last 3 Encounters:  11/22/18 189 lb 11.2 oz (86 kg)  11/08/18 186 lb 9.6 oz (84.6 kg)  11/07/18 187 lb 14.4 oz (85.2 kg)    Physical Exam Pulmonary:     Effort: No respiratory distress.  Neurological:     Mental Status: She is alert.     Cranial Nerves: No dysarthria.  Psychiatric:        Speech: Speech is not rapid and pressured, delayed or slurred.       Assessment & Plan:   Problem List Items Addressed This Visit      Cardiovascular and Mediastinum   Type 2 diabetes with decreased circulation ([place])    Followed by Dr. Francoise Schaumann; check feet really well every night      HYPERTENSION, BENIGN ESSENTIAL    She'll have husband check her BP and call us if not in range; try to limit salt        Other   Selective deficiency of IgG [facility])    She will see immune doctor next week         Follow up plan: Return if symptoms worsen or fail to improve.  No orders of the defined types were placed in this encounter.   No orders of the defined types were placed in this encounter.

## 2018-12-24 NOTE — Assessment & Plan Note (Signed)
She will see immune doctor next week

## 2018-12-24 NOTE — Assessment & Plan Note (Signed)
She'll have husband check her BP and call us if not in range; try to limit salt

## 2018-12-29 IMAGING — CT CT ANGIO CHEST
1 of 6 series · 18 of 36 positions shown · IV contrast (APPLIED)
Comparison: Chest radiographs 7333 hours today. Chest CT
01/02/2012.

CLINICAL DATA: 51-year-old female with central chest pain, upper
abdominal pain, shortness of breath. Nausea.

EXAM:
CT ANGIOGRAPHY CHEST WITH CONTRAST
TECHNIQUE: Multidetector CT imaging of the chest was performed using the
standard protocol during bolus administration of intravenous
contrast. Multiplanar CT image reconstructions and MIPs were
obtained to evaluate the vascular anatomy.
CONTRAST:  75 mL Isovue 370

[Series 5: thins · axial · 0.79mm/px · z∈[-544,-331]mm · 18 of 237 slices shown]
[im 12/237  lung]
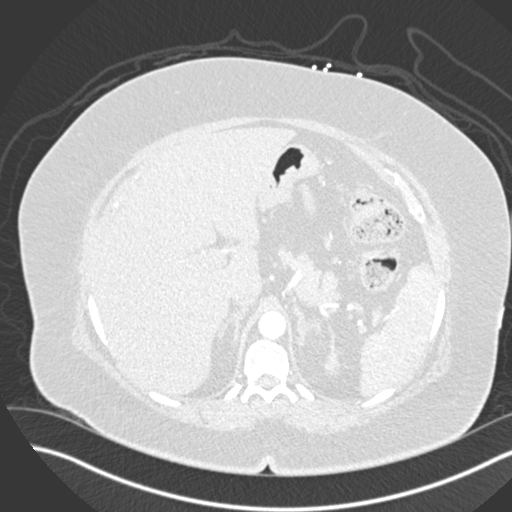
[im 24/237  mediastinal]
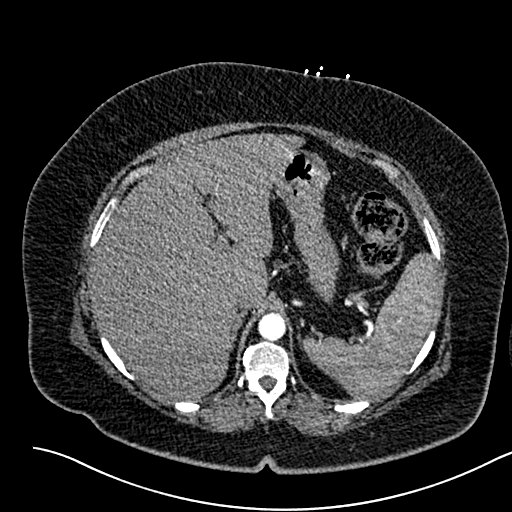
[im 36/237  lung]
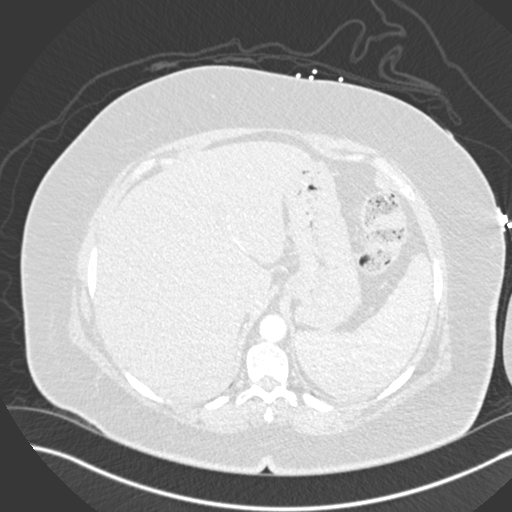
[im 48/237  mediastinal]
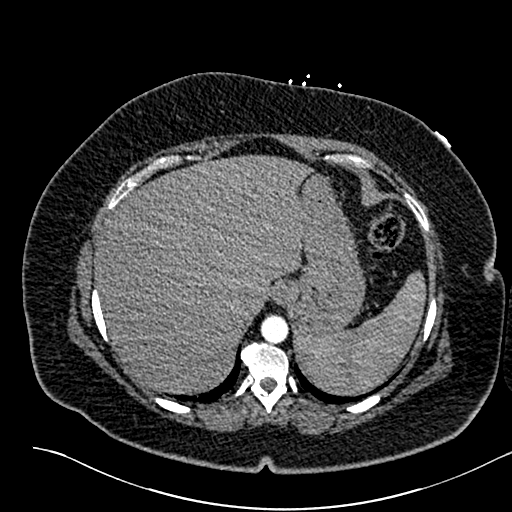
[im 60/237  lung]
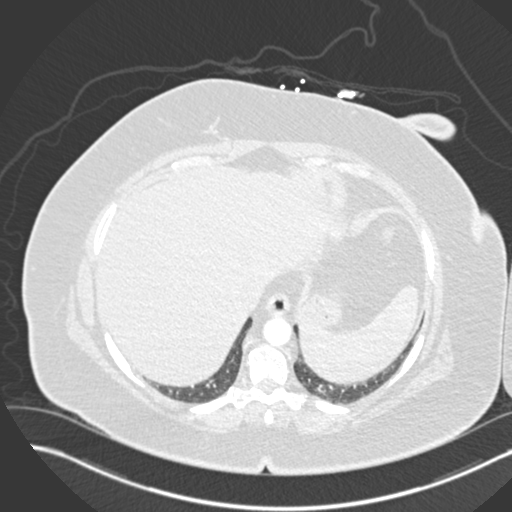
[im 71/237  mediastinal]
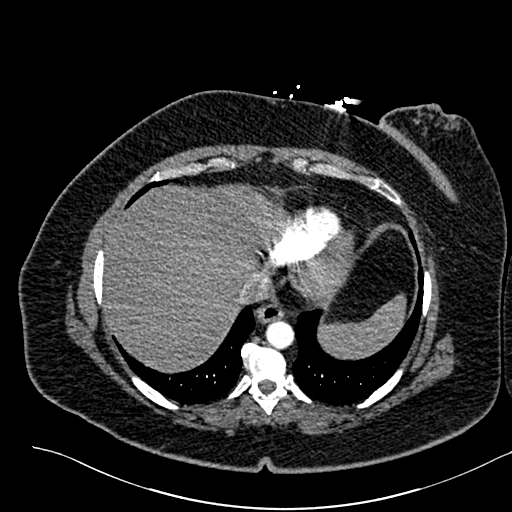
[im 83/237  lung]
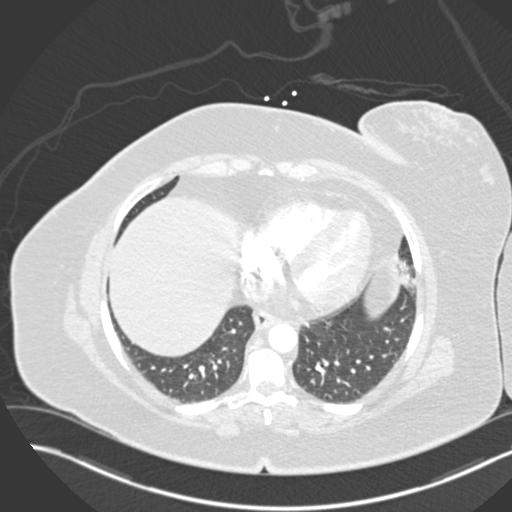
[im 95/237  mediastinal]
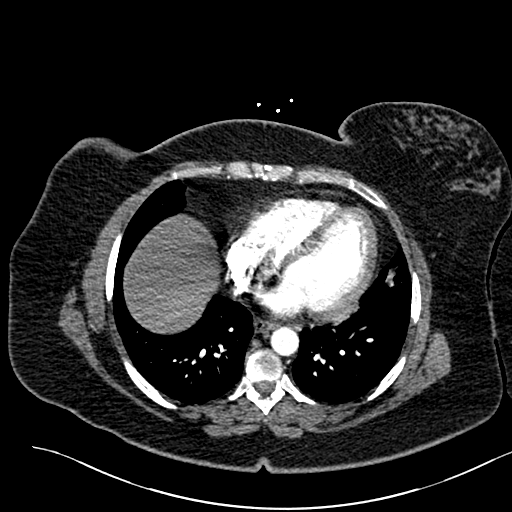
[im 107/237  lung]
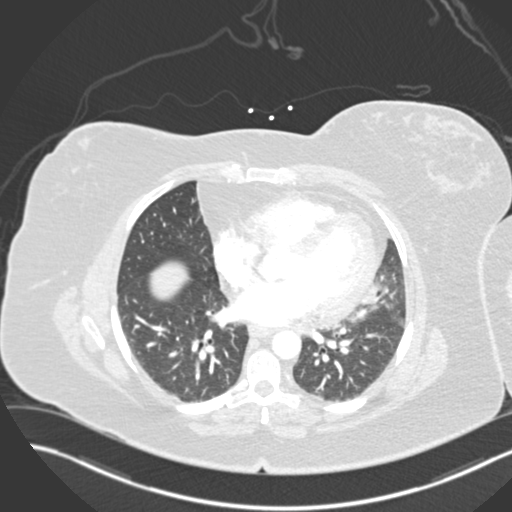
[im 130/237  mediastinal]
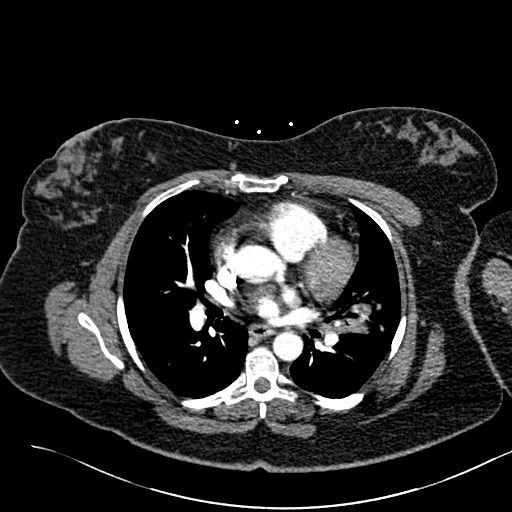
[im 142/237  lung]
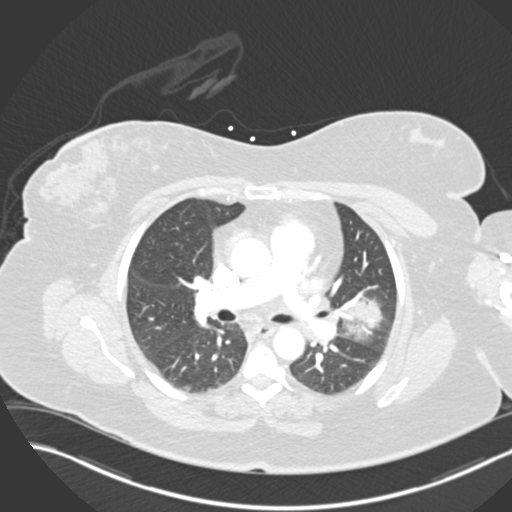
[im 154/237  mediastinal]
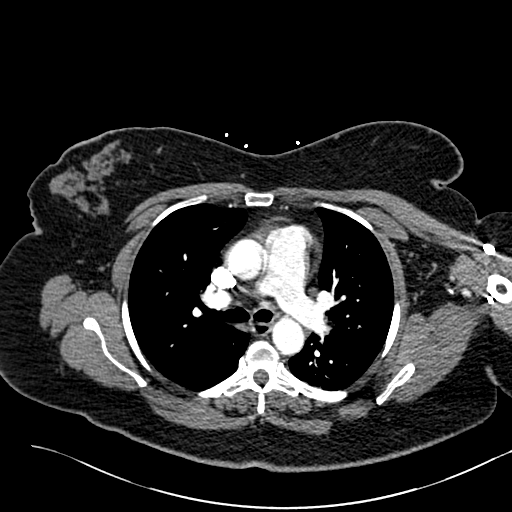
[im 166/237  lung]
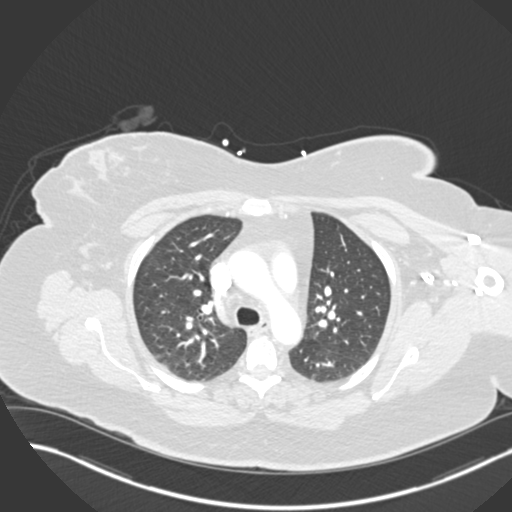
[im 178/237  mediastinal]
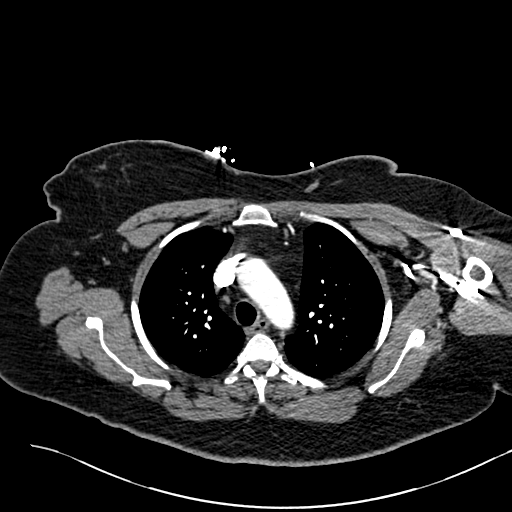
[im 189/237  lung]
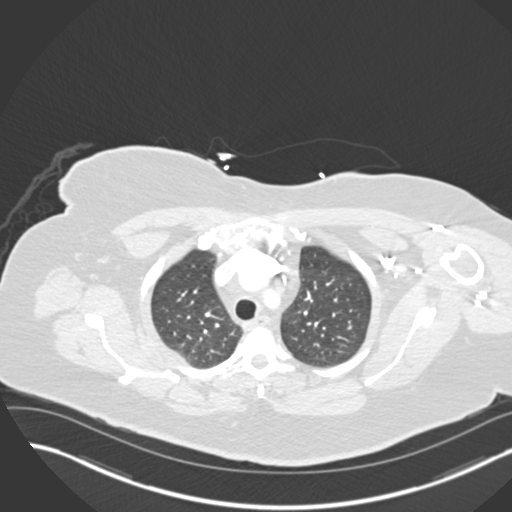
[im 201/237  mediastinal]
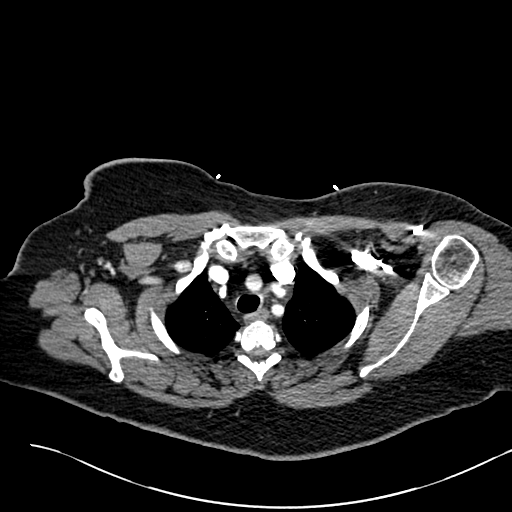
[im 213/237  lung]
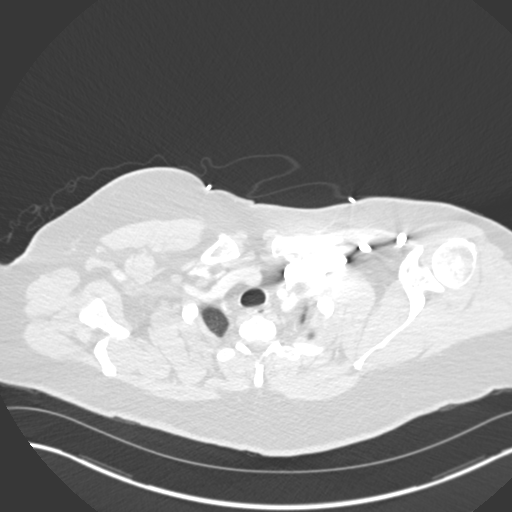
[im 225/237  mediastinal]
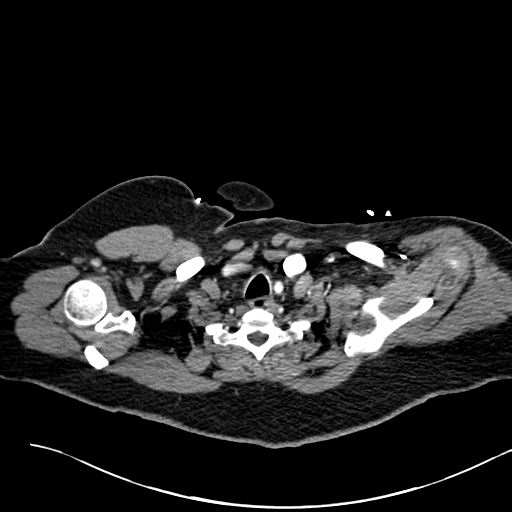

[18 of 36 positions shown; findings below may reference images not displayed]

FINDINGS: Cardiovascular: Adequate contrast bolus timing in the pulmonary
arterial tree.

No focal filling defect identified in the pulmonary arteries to
suggest acute pulmonary embolism.

Negative visualized aorta. There is evidence of some calcified
coronary artery atherosclerosis. No pericardial effusion.

Mediastinum/Nodes: Small left hilar and prevascular lymph nodes may
be mildly increased since 0806, such as reactive. Other mediastinal
and right hilar nodes are normal.

Lungs/Pleura: Consolidation in the posterior inferior aspect of the
left upper lobe adjacent to the hilum with extension into the
lingula. No left pleural effusion. The consolidation extends along
the major fissure but the left lower lobe is spared.

Major airways are patent. There is mild nonspecific peribronchial
ground-glass opacity in the right upper lobe (series 6, image 30).
No other abnormal right lung opacity. No right pleural effusion.

Upper Abdomen: Surgically absent gallbladder. Possible hepatomegaly,
but stable visualized upper abdominal viscera since 0806.

Musculoskeletal: Negative.

Review of the MIP images confirms the above findings.
IMPRESSION: 1.  Negative for acute pulmonary embolus.
2. Left lung multi-lobar pneumonia. No left pleural effusion.
Reactive appearing left mediastinal lymph nodes.
Followup PA and lateral chest X-ray is recommended in 3-4 weeks
following trial of antibiotic therapy to ensure resolution and
exclude underlying malignancy.
3. Possible early extension of pneumonia into the right upper lobe.
4. Mild calcified atherosclerosis suspected.

## 2018-12-29 IMAGING — CR DG CHEST 2V
1 series · 3 of 3 positions shown · non-contrast
Comparison: 08/31/2014

CLINICAL DATA: Chest pain for several hours

EXAM:
CHEST  2 VIEW

[Series 1: dg chest 2 view · 0.14mm/px · 3 of 3 slices shown]
[im 1/3]
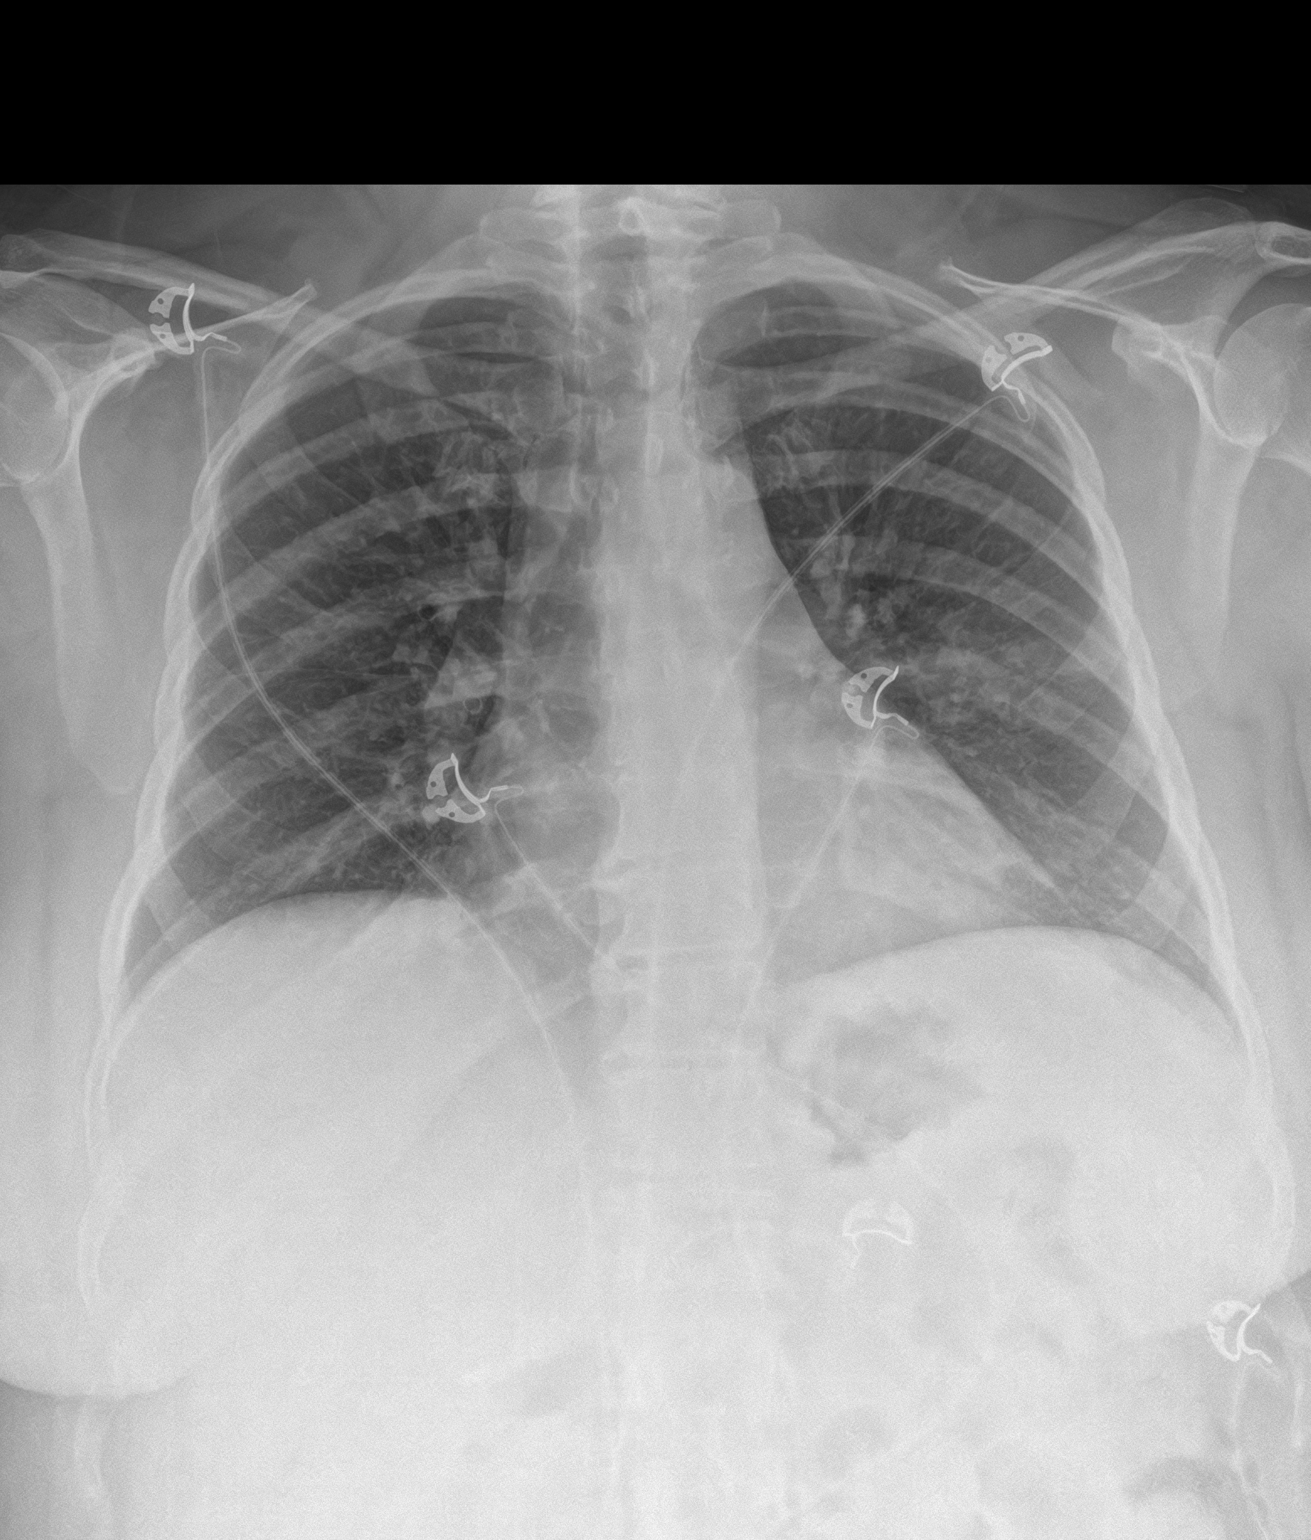
[im 2/3]
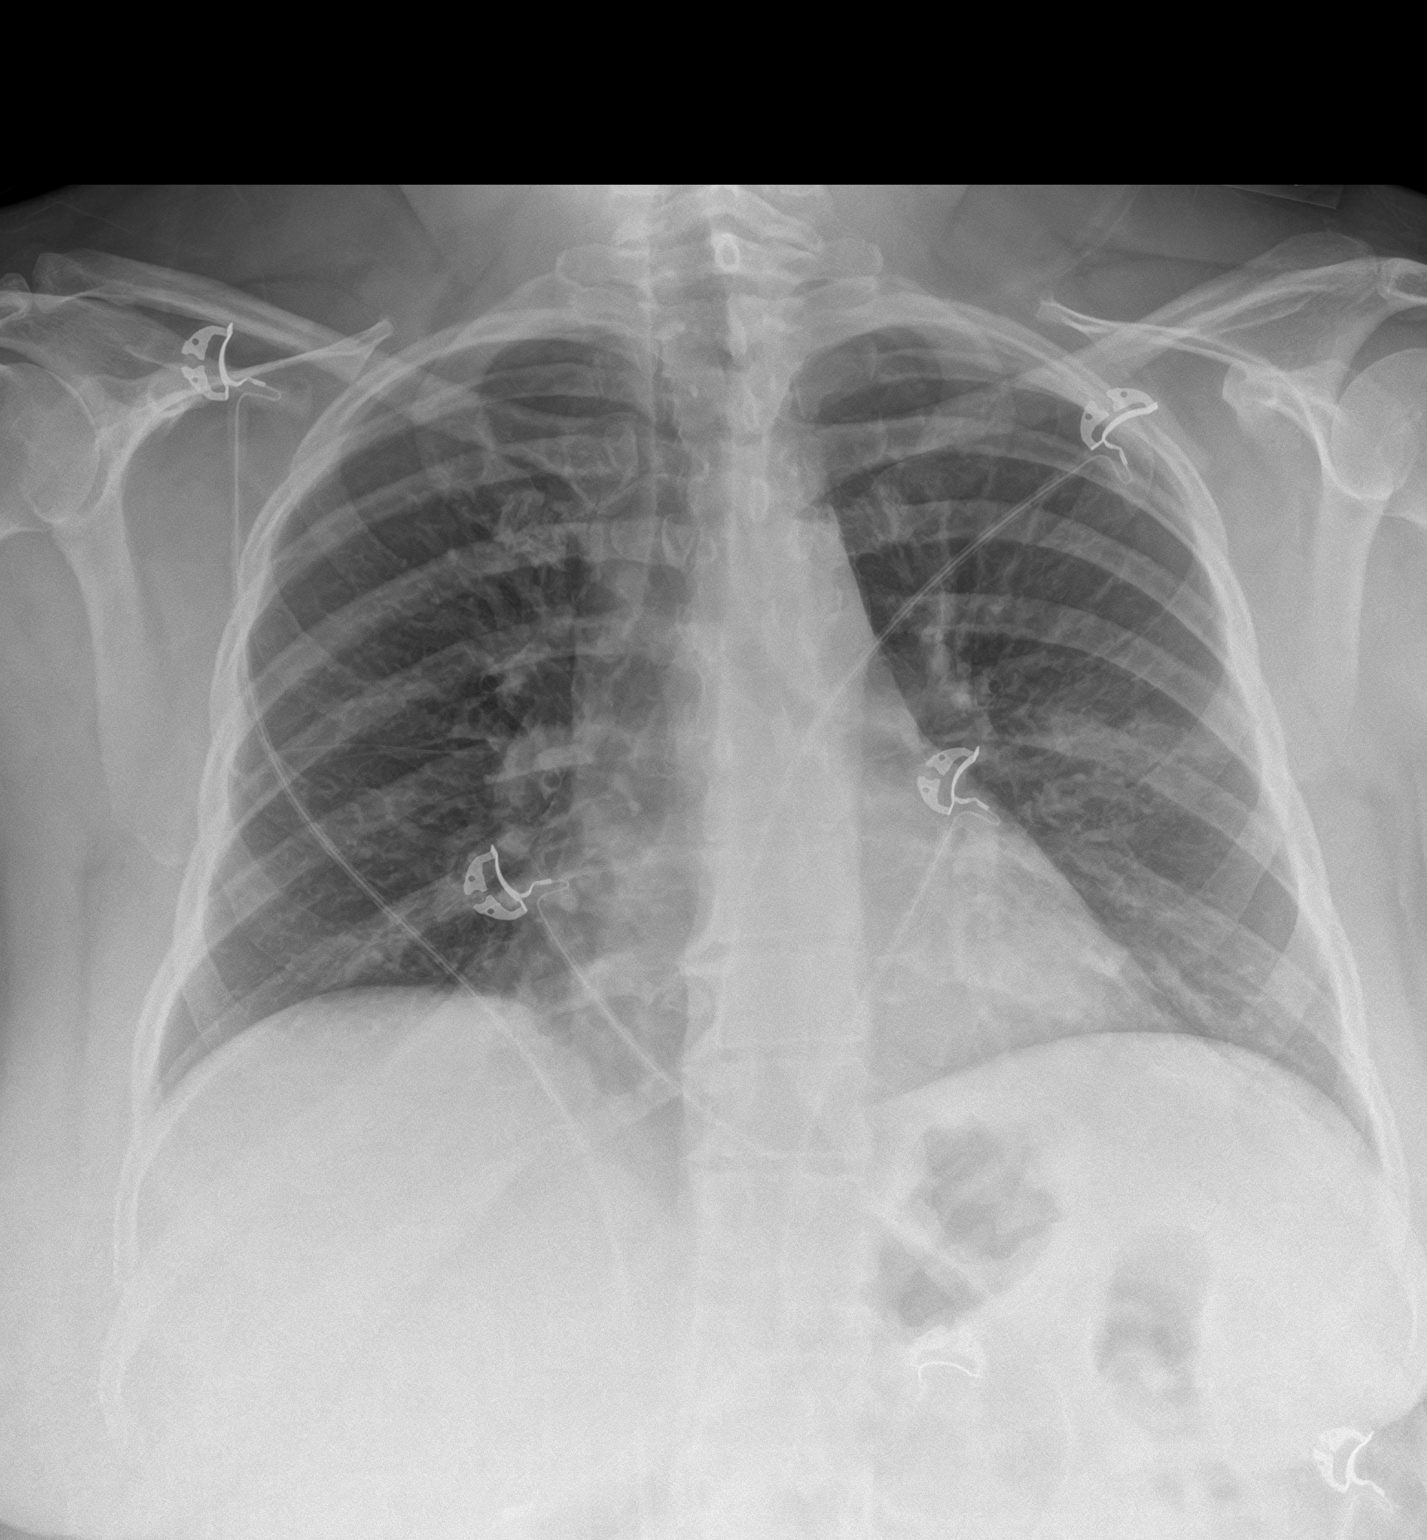
[im 3/3]
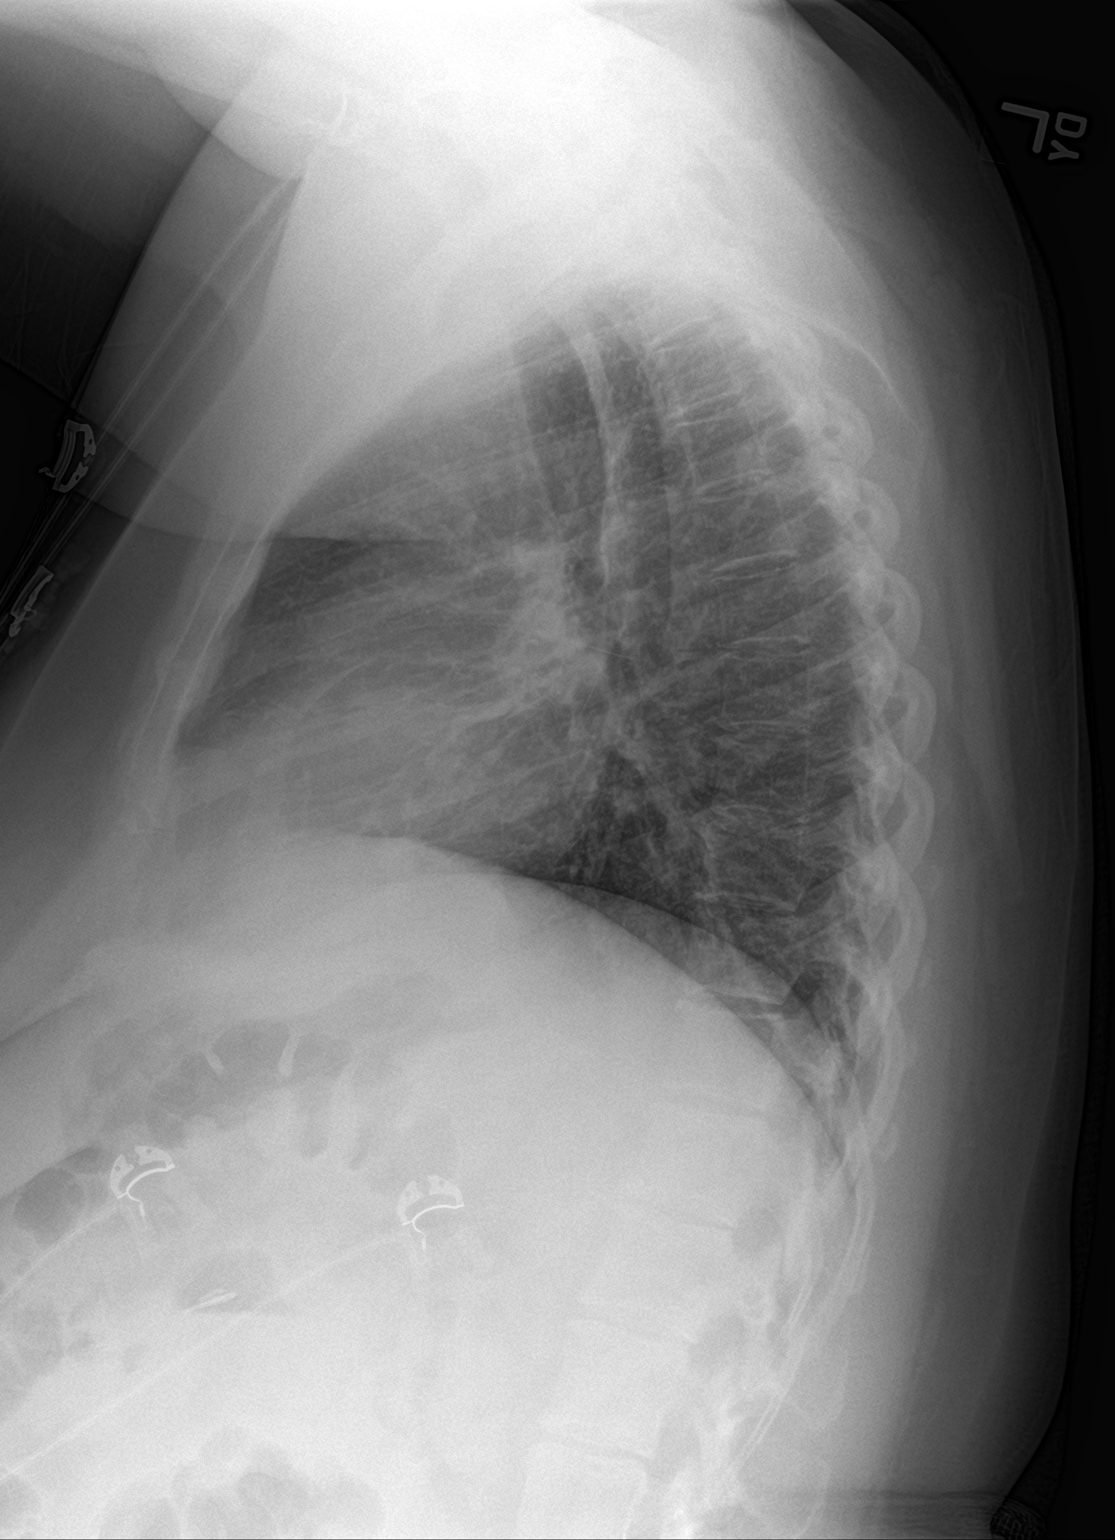

[3 of 3 positions shown; findings below may reference images not displayed]

FINDINGS: Cardiac shadow is within normal limits. The lungs are well aerated
bilaterally. Some patchy changes are noted in the left mid lung
consistent with acute infiltrate. No sizable effusion is noted. No
pneumothorax is seen. Degenerative changes of the thoracic spine are
noted.
IMPRESSION: Left midlung infiltrate.

## 2019-01-03 ENCOUNTER — Emergency Department: Payer: Medicare HMO

## 2019-01-03 ENCOUNTER — Emergency Department
Admission: EM | Admit: 2019-01-03 | Discharge: 2019-01-03 | Disposition: A | Discharge status: Home / Self Care | Payer: Medicare HMO | Attending: Emergency Medicine | Admitting: Emergency Medicine

## 2019-01-03 ENCOUNTER — Other Ambulatory Visit: Payer: Self-pay

## 2019-01-03 ENCOUNTER — Ambulatory Visit: Payer: Self-pay

## 2019-01-03 DIAGNOSIS — I509 Heart failure, unspecified: Secondary | ICD-10-CM | POA: Insufficient documentation

## 2019-01-03 DIAGNOSIS — Z79899 Other long term (current) drug therapy: Secondary | ICD-10-CM | POA: Insufficient documentation

## 2019-01-03 DIAGNOSIS — E039 Hypothyroidism, unspecified: Secondary | ICD-10-CM | POA: Diagnosis not present

## 2019-01-03 DIAGNOSIS — N183 Chronic kidney disease, stage 3 (moderate): Secondary | ICD-10-CM | POA: Diagnosis not present

## 2019-01-03 DIAGNOSIS — Z85828 Personal history of other malignant neoplasm of skin: Secondary | ICD-10-CM | POA: Insufficient documentation

## 2019-01-03 DIAGNOSIS — S060X9A Concussion with loss of consciousness of unspecified duration, initial encounter: Secondary | ICD-10-CM | POA: Diagnosis not present

## 2019-01-03 DIAGNOSIS — R739 Hyperglycemia, unspecified: Secondary | ICD-10-CM

## 2019-01-03 DIAGNOSIS — S20212A Contusion of left front wall of thorax, initial encounter: Secondary | ICD-10-CM | POA: Insufficient documentation

## 2019-01-03 DIAGNOSIS — I2510 Atherosclerotic heart disease of native coronary artery without angina pectoris: Secondary | ICD-10-CM | POA: Diagnosis not present

## 2019-01-03 DIAGNOSIS — I130 Hypertensive heart and chronic kidney disease with heart failure and stage 1 through stage 4 chronic kidney disease, or unspecified chronic kidney disease: Secondary | ICD-10-CM | POA: Diagnosis not present

## 2019-01-03 DIAGNOSIS — E1165 Type 2 diabetes mellitus with hyperglycemia: Secondary | ICD-10-CM | POA: Insufficient documentation

## 2019-01-03 DIAGNOSIS — W19XXXA Unspecified fall, initial encounter: Secondary | ICD-10-CM

## 2019-01-03 DIAGNOSIS — S0990XA Unspecified injury of head, initial encounter: Secondary | ICD-10-CM | POA: Diagnosis present

## 2019-01-03 DIAGNOSIS — E860 Dehydration: Secondary | ICD-10-CM

## 2019-01-03 DIAGNOSIS — E1122 Type 2 diabetes mellitus with diabetic chronic kidney disease: Secondary | ICD-10-CM | POA: Diagnosis not present

## 2019-01-03 DIAGNOSIS — Y92009 Unspecified place in unspecified non-institutional (private) residence as the place of occurrence of the external cause: Secondary | ICD-10-CM | POA: Insufficient documentation

## 2019-01-03 DIAGNOSIS — W109XXA Fall (on) (from) unspecified stairs and steps, initial encounter: Secondary | ICD-10-CM | POA: Diagnosis not present

## 2019-01-03 DIAGNOSIS — Y939 Activity, unspecified: Secondary | ICD-10-CM | POA: Insufficient documentation

## 2019-01-03 DIAGNOSIS — Y999 Unspecified external cause status: Secondary | ICD-10-CM | POA: Diagnosis not present

## 2019-01-03 DIAGNOSIS — J449 Chronic obstructive pulmonary disease, unspecified: Secondary | ICD-10-CM | POA: Diagnosis not present

## 2019-01-03 DIAGNOSIS — Z794 Long term (current) use of insulin: Secondary | ICD-10-CM | POA: Diagnosis not present

## 2019-01-03 LAB — CBC
HCT: 37.5 % (ref 36.0–46.0)
Hemoglobin: 12.1 g/dL (ref 12.0–15.0)
MCH: 28.7 pg (ref 26.0–34.0)
MCHC: 32.3 g/dL (ref 30.0–36.0)
MCV: 89.1 fL (ref 80.0–100.0)
Platelets: 282 10*3/uL (ref 150–400)
RBC: 4.21 MIL/uL (ref 3.87–5.11)
RDW: 14.1 % (ref 11.5–15.5)
WBC: 8.4 10*3/uL (ref 4.0–10.5)
nRBC: 0 % (ref 0.0–0.2)

## 2019-01-03 LAB — URINALYSIS, COMPLETE (UACMP) WITH MICROSCOPIC
Bacteria, UA: NONE SEEN
Bilirubin Urine: NEGATIVE
Glucose, UA: >=500 mg/dL — AB
Hgb urine dipstick: NEGATIVE
Ketones, ur: NEGATIVE mg/dL
Leukocytes,Ua: NEGATIVE
Nitrite: NEGATIVE
Protein, ur: NEGATIVE mg/dL
Specific Gravity, Urine: 1.03 (ref 1.005–1.030)
pH: 5 (ref 5.0–8.0)

## 2019-01-03 LAB — BASIC METABOLIC PANEL
Anion gap: 13 (ref 5–15)
BUN: 37 mg/dL — ABNORMAL HIGH (ref 6–20)
CO2: 25 mmol/L (ref 22–32)
Calcium: 9.4 mg/dL (ref 8.9–10.3)
Chloride: 96 mmol/L — ABNORMAL LOW (ref 98–111)
Creatinine, Ser: 1.33 mg/dL — ABNORMAL HIGH (ref 0.44–1.00)
GFR calc Af Amer: 52 mL/min — ABNORMAL LOW (ref >60)
GFR calc non Af Amer: 45 mL/min — ABNORMAL LOW (ref >60)
Glucose, Bld: 437 mg/dL — ABNORMAL HIGH (ref 70–99)
Potassium: 4.7 mmol/L (ref 3.5–5.1)
Sodium: 134 mmol/L — ABNORMAL LOW (ref 135–145)

## 2019-01-03 LAB — GLUCOSE, CAPILLARY
Glucose-Capillary: 338 mg/dL — ABNORMAL HIGH (ref 70–99)
Glucose-Capillary: 244 mg/dL — ABNORMAL HIGH (ref 70–99)

## 2019-01-03 LAB — TROPONIN I: Troponin I: <0.03 ng/mL (ref <0.03)

## 2019-01-03 MED ORDER — INSULIN ASPART 100 UNIT/ML ~~LOC~~ SOLN
5.0000 [IU] | Freq: Once | SUBCUTANEOUS | Status: AC
  Administered 2019-01-03: 19:00:00 5 [IU] via INTRAVENOUS
  Filled 2019-01-03: qty 1

## 2019-01-03 MED ORDER — ONDANSETRON HCL 4 MG/2ML IJ SOLN
4.0000 mg | Freq: Once | INTRAMUSCULAR | Status: AC
  Administered 2019-01-03: 4 mg via INTRAVENOUS
  Filled 2019-01-03: qty 2

## 2019-01-03 MED ORDER — OXYCODONE-ACETAMINOPHEN 5-325 MG PO TABS
1.5000 | ORAL_TABLET | ORAL | Status: AC
  Administered 2019-01-03: 19:00:00 1.5 via ORAL
  Filled 2019-01-03: qty 2

## 2019-01-03 MED ORDER — SODIUM CHLORIDE 0.9 % IV BOLUS
1000.0000 mL | Freq: Once | INTRAVENOUS | Status: AC
  Administered 2019-01-03: 1000 mL via INTRAVENOUS

## 2019-01-03 MED ORDER — OXYCODONE HCL 5 MG PO TABS: 5.0000 mg | ORAL_TABLET | Freq: Three times a day (TID) | ORAL | 0 refills | Status: DC | PRN

## 2019-01-03 NOTE — Discharge Instructions (Addendum)
Please return to the emergency department for any increasing headache, nausea, vomiting, worsening symptoms or urgent changes in your health.  Use oxycodone as needed for severe pain.  You may use Tylenol for mild to moderate pain.  Avoid screens, bright lights, loud noises.  Follow-up PCP in 1 week.

## 2019-01-03 NOTE — Telephone Encounter (Signed)
Agree with disposition for EMS transport to ED

## 2019-01-03 NOTE — Telephone Encounter (Signed)
Pt c/o passing out yesterday and fell on her left side of her trunk and left side of face. Pt stated that she had LOC and doesn't remember what happened after the fall. Family told her she came in trailer and changed clothes and she stated that she has recollection of doing any of that. Yesterday after fall c/o severe headache, today headache is mild. Fall occurred yesterday at 12:30. No cuts or lumps or bumps to ead, denies dizziness or visual disturbances and is alert and orient x 3. Pt given care advice and pt verbalized understanding. Pt advised to go to ED. Pt is going to [facility] ED for evaluation.   Reason for Disposition . [1] Knocked out (unconscious) < 1 minute AND [2] now fine . Can't remember what happened (amnesia)  Answer Assessment - Initial Assessment Questions 1. MECHANISM: "How did the injury happen?"      Passed out and fell on face and doesn't remember events after the fall 2. ONSET: "When did the injury happen?" (Minutes or hours ago)     Yesterday 12:30 3. LOCATION: "What part of the face is injured?"    Left side 4. APPEARANCE of INJURY: "What does the face look like?"     No cut or brusing 5. BLEEDING: "Is it bleeding now?" If so, ask, "Is it difficult to stop?"     no 6. PAIN: "Is there pain?" If so, ask: "How bad is the pain?"  (e.g., Scale 1-10; or mild, moderate, severe)    Headache mild 7. SIZE: For cuts, bruises, or swelling, ask: "How large is it?" (e.g., inches or centimeters)      n/a 8. TETANUS: For any breaks in the skin, ask: "When was the last tetanus booster?"     n/a 9. OTHER SYMPTOMS: "Do you have any other symptoms?" (e.g., neck pain, headache, loss of consciousness)     LOC yesterday and doesn't remember events after wards 10. PREGNANCY: "Is there any chance you are pregnant?" "When was your last menstrual period?"       n/a  Answer Assessment - Initial Assessment Questions 1. MECHANISM: "How did the injury happen?" For falls, ask: "What height did  you fall from?" and "What surface did you fall against?"      Pt stated she passed out and fell down back door steps 2. ONSET: "When did the injury happen?" (Minutes or hours ago)      Yesterday 12:30 3. NEUROLOGIC SYMPTOMS: "Was there any loss of consciousness?" "Are there any other neurological symptoms?"      Yes-amnesia after the event severe headache 4. MENTAL STATUS: "Does the person know who he is, who you are, and where he is?"      yes 5. LOCATION: "What part of the head was hit?"      Left side of face 6. SCALP APPEARANCE: "What does the scalp look like? Is it bleeding now?" If so, ask: "Is it difficult to stop?"      Per pt no issues with head 7. SIZE: For cuts, bruises, or swelling, ask: "How large is it?" (e.g., inches or centimeters)      n/a 8. PAIN: "Is there any pain?" If so, ask: "How bad is it?"  (e.g., Scale 1-10; or mild, moderate, severe)     Headache mild 9. TETANUS: For any breaks in the skin, ask: "When was the last tetanus booster?"     n/a 10. OTHER SYMPTOMS: "Do you have any other symptoms?" (e.g., neck pain, vomiting)  Soreness to left side under breast 11. PREGNANCY: "Is there any chance you are pregnant?" "When was your last menstrual period?"       n/a  Protocols used: [place], HEAD INJURY-A-AH

## 2019-01-03 NOTE — ED Provider Notes (Signed)
[facility] EMERGENCY DEPARTMENT Provider Note   CSN: [id number] Arrival date & time: 01/03/19  1431    History   Chief Complaint Chief Complaint  Patient presents with  . Loss of Consciousness  . Fall  . Chest Pain    HPI [name] is a 53 y.o. female with a history of type 2 diabetes, hypertension, coronary artery disease, COPD, bipolar, hypothyroidism presents to the ED emergency department for evaluation of fall yesterday around 3 PM.  Patient states she was at home going down steps, approximately 2 feet high when she believes she tripped and fell hitting her head.  Patient states she does not recall about a 30-minute period after hitting her head.  Her husband escorted her inside.  Since her fall she has had a headache, photophobia, mild nausea without vomiting.  Headache is moderate.  She did get relief yesterday with BC powder.  She denies any neck pain numbness tingling radicular symptoms.  She is had mild left rib pain but no chest pain or shortness of breath.  She denies any dizziness or lightheadedness prior to or after her fall.  Patient has been taking her insulin regularly.     HPI  Past Medical History:  Diagnosis Date  . Anxiety   . Arthritis   . Asthma   . Bipolar depression ([place])   . CHF (congestive heart failure) ([place])   . Chronic headaches    migraines have lessened  . Chronic kidney disease    ddstage 111  . COPD (chronic obstructive pulmonary disease) ([place])   . Coronary artery disease   . Depression   . Diabetic neuropathy ([place])   . Dyspnea    uses inhalers for this  . Dysrhythmia    rapid  . Fatty liver   . GERD (gastroesophageal reflux disease)   . Headache   . Heart attack ([place]) 2009  . Hyperlipidemia   . Hypertension   . Hypomagnesemia   . Hypothyroid   . Irregular heart rhythm   . Liver lesion    Favored to be benign on CT; MRI of abd w/contract in Aug 2016. Liver Biopsy-Negative, March 2016  . Morbid obesity  ([place])   . OSA (obstructive sleep apnea)   . Peripheral vascular disease ([place])   . Pinched nerve    Back  . Pneumonia   . RLS (restless legs syndrome)   . Seasonal allergies   . Sleep apnea    uses oxygen at night 2L d/t cpap did not fit  . Type 2 diabetes, uncontrolled, with neuropathy ([place]) 10/15/2015   Overview:  Microalbumin 7.7 12/2010: Hgb A1c 10.2% 12/2010 and 9.7% 03/2011, eye exam 10/2009 [facility]   . Urinary incontinence   . Vitamin D deficiency     Patient Active Problem List   Diagnosis Date Noted  . Type 2 diabetes with decreased circulation ([place]) 11/07/2018  . Vitamin B12 deficiency 08/08/2018  . Encounter for Medicare annual wellness exam 10/30/2017  . Patient is full code 10/30/2017  . Disorder of bursae of shoulder region 10/09/2017  . Coronary artery disease 10/09/2017  . Plantar fascial fibromatosis 10/09/2017  . Tendinitis of wrist 10/09/2017  . Schizoaffective disorder, bipolar type ([place]) 09/28/2017  . Chest pain 09/10/2017  . Pleuritic chest pain 05/27/2017  . Hypertriglyceridemia 12/21/2016  . Pansinusitis 11/15/2016  . 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 Breast cancer screening 09/28/2016  . Urine frequency 09/28/2016  . Preventative health  care 09/28/2016  . Fatigue 05/01/2016  . Urinary tract infection 03/14/2016  . Adenomatous colon polyp 10/15/2015  . Airway hyperreactivity 10/15/2015  . Bipolar affective disorder ([place]) 10/15/2015  . CN (constipation) 10/15/2015  . CAFL (chronic airflow limitation) ([place]) 10/15/2015  . Drug-induced hepatic toxicity 10/15/2015  . Blood in feces 10/15/2015  . HLD (hyperlipidemia) 10/15/2015  . Hypoxemia 10/15/2015  . NASH (nonalcoholic steatohepatitis) 10/15/2015  . Morbid obesity ([place]) 10/15/2015  . Restless leg syndrome 10/15/2015  . Type 2 diabetes, uncontrolled, with neuropathy ([place]) 10/15/2015  . Elevated alkaline phosphatase level 09/22/2015  . Urinary bladder incontinence  09/22/2015  . Low back pain with left-sided sciatica 08/18/2015  . Encounter for monitoring tricyclic antidepressant therapy 05/13/2015  . Bipolar disorder ([place]) 05/11/2015  . Liver disease 05/11/2015  . Apnea, sleep 05/05/2015  . Heart valve disease 05/05/2015  . Chronic kidney disease, stage III (moderate) (HCC) 03/15/2015  . Migraine 03/12/2015  . COPD (chronic obstructive pulmonary disease) ([place]) 01/20/2015  . 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 GERD (gastroesophageal reflux disease) 01/20/2015  . Hypothyroid   . Vitamin D deficiency   . Urinary incontinence   . Liver lesion   . Hypomagnesemia   . Obstructive sleep apnea 10/20/2013  . Vaginitis 12/19/2011  . PERIPHERAL NEUROPATHY 03/04/2009  . HEMORRHOIDS, EXTERNAL 09/03/2008  . ALLERGIC RHINITIS 07/04/2007  . ASTHMA, PERSISTENT, MODERATE 07/04/2007  . INTERSTITIAL CYSTITIS 07/04/2007  . HYPERTENSION, BENIGN ESSENTIAL 06/19/2007    Past Surgical History:  Procedure Laterality Date  . ABDOMINAL HYSTERECTOMY    . ABDOMINAL HYSTERECTOMY     Partial  . bladder stim  2007  . BLADDER SURGERY     x 2. tacking of bladder and the other was the stimulator placement  . CHOLECYSTECTOMY    . INTERSTIM-GENERATOR CHANGE N/A 12/31/2017   Procedure: INTERSTIM-GENERATOR CHANGE;  Surgeon: Bjorn Loser, MD;  Location: [facility] ORS;  Service: Urology;  Laterality: N/A;  . LIVER BIOPSY  March 2016   Negative  . LUNG BIOPSY  2015   small spot on lung  . TUBAL LIGATION       OB History    Gravida  0   Para  0   Term  0   Preterm  0   AB  0   Living        SAB  0   TAB  0   Ectopic  0   Multiple      Live Births               Home Medications    Prior to Admission medications   Medication Sig Start Date End Date Taking? Authorizing Provider  albuterol (PROVENTIL HFA;VENTOLIN HFA) 108 (90 Base) MCG/ACT inhaler Inhale 2 puffs into the lungs every 4 (four) hours as needed for wheezing or shortness of  breath. 10/04/18   Arnetha Courser, MD  atorvastatin (LIPITOR) 80 MG tablet Take 80 mg by mouth at bedtime.     [provider]  budesonide-formoterol (SYMBICORT) 160-4.5 MCG/ACT inhaler Inhale 2 puffs into the lungs 2 (two) times daily. 10/04/18   Arnetha Courser, MD  cholecalciferol (VITAMIN D) 1000 units tablet Take 1,000 Units by mouth daily.    [provider]  divalproex (DEPAKOTE ER) 250 MG 24 hr tablet divalproex ER 250 mg tablet,extended release 24 hr    [provider]  DULoxetine (CYMBALTA) 60 MG capsule Take 120 mg by mouth daily.    [provider]  empagliflozin (  JARDIANCE) 25 MG TABS tablet Take 25 mg by mouth daily.    [provider]  ezetimibe (ZETIA) 10 MG tablet Take 10 mg by mouth daily.  01/05/17   [provider]  famotidine (PEPCID) 20 MG tablet Take 1 tablet (20 mg total) by mouth at bedtime. 10/21/18   Poulose, Bethel Born, NP  fenofibrate micronized (LOFIBRA) 134 MG capsule Take 134 mg by mouth at bedtime.     [provider]  fluticasone (FLONASE) 50 MCG/ACT nasal spray Place 2 sprays into both nostrils daily. 04/16/18   Arnetha Courser, MD  gabapentin (NEURONTIN) 300 MG capsule Take 1 capsule (300 mg total) by mouth 2 (two) times daily. Please schedule appointment to receive further refills. 10/24/18   Edrick Kins, DPM  HUMULIN R U-500 KWIKPEN 500 UNIT/ML kwikpen Inject 70 Units as directed 3 (three) times daily.     [provider]  hydrOXYzine (VISTARIL) 50 MG capsule Take 50-100 mg by mouth daily as needed for anxiety.     [provider]  insulin aspart (NOVOLOG) 100 UNIT/ML injection Inject 5-25 Units into the skin 3 (three) times daily before meals.     [provider]  lamoTRIgine (LAMICTAL) 200 MG tablet Take 200 mg by mouth 2 (two) times daily.     [provider]  levothyroxine (SYNTHROID, LEVOTHROID) 50 MCG tablet One by mouth daily three days a week, one and one-half  pills four days a week (Mon, Wed, Fri, Sat for example) 10/07/18   Arnetha Courser, MD  linaclotide [facility]) 72 MCG capsule Take 1 capsule (72 mcg total) by mouth daily before breakfast. 10/30/17   Lada, Satira Anis, MD  lisinopril (PRINIVIL,ZESTRIL) 2.5 MG tablet Take 1 tablet (2.5 mg total) by mouth daily. 12/17/18   Arnetha Courser, MD  metoprolol tartrate (LOPRESSOR) 25 MG tablet Take one tablet 25 mg in the am and 1/2 tab in the pm 12/16/18   Minna Merritts, MD  mupirocin ointment (BACTROBAN) 2 % Place 1 application into the nose 2 (two) times daily. Patient not taking: Reported on 12/24/2018 11/22/18   Hubbard Hartshorn, FNP  ondansetron (ZOFRAN) 4 MG tablet Take 1 tablet (4 mg total) by mouth every 8 (eight) hours as needed for nausea or vomiting. 10/04/18   Lada, Satira Anis, MD  oxyCODONE (ROXICODONE) 5 MG immediate release tablet Take 1 tablet (5 mg total) by mouth every 8 (eight) hours as needed. 01/03/19 01/03/20  Duanne Guess, PA-C  pantoprazole (PROTONIX) 40 MG tablet Take 1 tablet (40 mg total) by mouth daily. 11/06/18   [name], MD  QUEtiapine (SEROQUEL) 400 MG tablet Take 400 mg by mouth at bedtime.    [provider]  traZODone (DESYREL) 100 MG tablet Take 100 mg by mouth at bedtime.    [provider]  TRULICITY 1.5 HT/3.4KA SOPN Inject 1.5 mLs as directed once a week.  03/26/18   [provider]  Flossie Buffy SHORT PEN NEEDLES 31G X 8 MM [place]  07/31/18   [provider]    Family History Family History  Problem Relation Age of Onset  . Diabetes Mother   . Heart disease Mother   . Hyperlipidemia Mother   . Hypertension Mother   . Kidney disease Mother   . Mental illness Mother   . Hypothyroidism Mother   . Stroke Mother   . Osteoporosis Mother   . Glaucoma Mother   . Congestive Heart Failure Mother   . Hypertension  Father   . Asthma Daughter   . Cancer Daughter        throat  . Bipolar disorder Daughter   . Heart disease Maternal Grandfather    . Rheum arthritis Maternal Grandfather   . Cancer Maternal Grandfather        liver  . Kidney disease Maternal Grandfather   . Cancer Paternal Grandmother        lung  . Kidney disease Brother   . Breast cancer Maternal Grandmother 60  . Bipolar disorder Daughter   . Hypertension Daughter   . Restless legs syndrome Daughter     Social History Social History   Tobacco Use  . Smoking status: Never Smoker  . Smokeless tobacco: Never Used  Substance Use Topics  . Alcohol use: No  . Drug use: No     Allergies   Mirtazapine; Morphine and related; Sulfamethoxazole-trimethoprim; Amoxicillin er; Codeine; Hydrocodone-acetaminophen; Keflex [cephalexin]; and Prasterone   Review of Systems Review of Systems  Constitutional: Negative for activity change, chills, fatigue and fever.  HENT: Negative for congestion, sinus pressure and sore throat.   Eyes: Positive for photophobia. Negative for visual disturbance.  Respiratory: Negative for cough, chest tightness and shortness of breath.   Cardiovascular: Negative for chest pain and leg swelling.  Gastrointestinal: Positive for nausea. Negative for abdominal pain, diarrhea and vomiting.  Genitourinary: Negative for dysuria, frequency and urgency.  Musculoskeletal: Negative for arthralgias, back pain, gait problem, joint swelling and neck pain.  Skin: Negative for rash.  Neurological: Positive for headaches. Negative for weakness and numbness.  Hematological: Negative for adenopathy.  Psychiatric/Behavioral: Negative for agitation, behavioral problems and confusion.     Physical Exam Updated Vital Signs BP 135/82 (BP Location: Right Arm)   Pulse 85   Temp 98 F (36.7 C) (Oral)   Resp 18   Ht 4' 11"  (1.499 m)   Wt 81.6 kg   SpO2 100%   BMI 36.36 kg/m   Physical Exam Constitutional:      Appearance: Normal appearance. She is well-developed.  HENT:     Head: Normocephalic and atraumatic.     Right Ear: Tympanic membrane,  ear canal and external ear normal.     Left Ear: Tympanic membrane, ear canal and external ear normal.     Mouth/Throat:     Mouth: Mucous membranes are moist.     Pharynx: No oropharyngeal exudate or posterior oropharyngeal erythema.  Eyes:     Extraocular Movements: Extraocular movements intact.     Conjunctiva/sclera: Conjunctivae normal.     Pupils: Pupils are equal, round, and reactive to light.  Neck:     Musculoskeletal: Normal range of motion.  Cardiovascular:     Rate and Rhythm: Normal rate.     Pulses: Normal pulses.  Pulmonary:     Effort: Pulmonary effort is normal. No respiratory distress.  Abdominal:     General: Abdomen is flat. There is no distension.     Tenderness: There is no abdominal tenderness. There is no guarding.  Musculoskeletal: Normal range of motion.        General: No swelling or tenderness.     Comments: Minimal left rib discomfort with no bruising ecchymosis or step-off.  Good breath sounds bilaterally.  Skin:    General: Skin is warm.     Findings: No rash.  Neurological:     General: No focal deficit present.     Mental Status: She is alert and oriented to person, place, and time. Mental  status is at baseline.     Cranial Nerves: No cranial nerve deficit.     Sensory: No sensory deficit.     Motor: No weakness.     Coordination: Coordination normal.     Gait: Gait normal.  Psychiatric:        Mood and Affect: Mood normal.        Behavior: Behavior normal.        Thought Content: Thought content normal.        Judgment: Judgment normal.      ED Treatments / Results  Labs (all labs ordered are listed, but only abnormal results are displayed) Labs Reviewed  BASIC METABOLIC PANEL - Abnormal; Notable for the following components:      Result Value   Sodium 134 (*)    Chloride 96 (*)    Glucose, Bld 437 (*)    BUN 37 (*)    Creatinine, Ser 1.33 (*)    GFR calc non Af Amer 45 (*)    GFR calc Af Amer 52 (*)    All other components  within normal limits  URINALYSIS, COMPLETE (UACMP) WITH MICROSCOPIC - Abnormal; Notable for the following components:   Color, Urine YELLOW (*)    APPearance CLEAR (*)    Glucose, UA >=500 (*)    All other components within normal limits  GLUCOSE, CAPILLARY - Abnormal; Notable for the following components:   Glucose-Capillary 338 (*)    All other components within normal limits  GLUCOSE, CAPILLARY - Abnormal; Notable for the following components:   Glucose-Capillary 244 (*)    All other components within normal limits  CBC  TROPONIN I  CBG MONITORING, ED  CBG MONITORING, ED  CBG MONITORING, ED  CBG MONITORING, ED  CBG MONITORING, ED  CBG MONITORING, ED  CBG MONITORING, ED  CBG MONITORING, ED  CBG MONITORING, ED  CBG MONITORING, ED  CBG MONITORING, ED  CBG MONITORING, ED  CBG MONITORING, ED  CBG MONITORING, ED  CBG MONITORING, ED  CBG MONITORING, ED    EKG None  Radiology Dg Chest 2 View  Result Date: 01/03/2019 CLINICAL DATA:  Pt reports syncope and fall yesterday with positive LOC. Reports left sided chest pain under left breast since. EXAM: CHEST - 2 VIEW COMPARISON:  07/06/2018 FINDINGS: The heart size and mediastinal contours are within normal limits. Both lungs are clear. The visualized skeletal structures are unremarkable. IMPRESSION: No active cardiopulmonary disease. Electronically Signed   By: Kathreen Devoid   On: 01/03/2019 18:51   Ct Head Wo Contrast  Result Date: 01/03/2019 CLINICAL DATA:  Head trauma, fall EXAM: CT HEAD WITHOUT CONTRAST TECHNIQUE: Contiguous axial images were obtained from the base of the skull through the vertex without intravenous contrast. COMPARISON:  07/04/2018 FINDINGS: Brain: There is no mass, hemorrhage or extra-axial collection. The size and configuration of the ventricles and extra-axial CSF spaces are normal. There is hypoattenuation of the white matter, most commonly indicating chronic small vessel disease. Vascular: No abnormal  hyperdensity of the major intracranial arteries or dural venous sinuses. No intracranial atherosclerosis. Skull: The visualized skull base, calvarium and extracranial soft tissues are normal. Sinuses/Orbits: No fluid levels or advanced mucosal thickening of the visualized paranasal sinuses. No mastoid or middle ear effusion. The orbits are normal. IMPRESSION: Chronic small vessel disease without acute intracranial abnormality. Electronically Signed   By: Ulyses Jarred M.D.   On: 01/03/2019 15:54    Procedures Procedures (including critical care time)  Medications Ordered in ED  Medications  sodium chloride 0.9 % bolus 1,000 mL (1,000 mLs Intravenous New Bag/Given 01/03/19 1856)  insulin aspart (novoLOG) injection 5 Units (5 Units Intravenous Given 01/03/19 1856)  oxyCODONE-acetaminophen (PERCOCET/ROXICET) 5-325 MG per tablet 1.5 tablet (1.5 tablets Oral Given 01/03/19 1920)  ondansetron (ZOFRAN) injection 4 mg (4 mg Intravenous Given 01/03/19 1920)     Initial Impression / Assessment and Plan / ED Course  I have reviewed the triage vital signs and the nursing notes.  Pertinent labs & imaging results that were available during my care of the patient were reviewed by me and considered in my medical decision making (see chart for details).        53 year old female with fall yesterday, she hit her head lost consciousness and has had headache with other concussion symptoms.  She also noted mild left rib pain.  CT of the head and chest x-ray negative for any acute process.  Patient with normal EKG and troponin.  CBC normal.  BMP showed elevated blood glucose of 437.  She was given IV fluids and insulin, blood glucose down to 244.  Patient given oxycodone for headache, patient asymptomatic at time of discharge.  Patient stable and ready for discharge home.  She understands signs symptoms to return to ED for.  Final Clinical Impressions(s) / ED Diagnoses   Final diagnoses:  Fall, initial encounter   Dehydration  Concussion with loss of consciousness, initial encounter  Rib contusion, left, initial encounter  Hyperglycemia    ED Discharge Orders         Ordered    oxyCODONE (ROXICODONE) 5 MG immediate release tablet  Every 8 hours PRN     01/03/19 2018           Duanne Guess, PA-C 01/03/19 [place], [place], MD 01/03/19 [phone]

## 2019-01-03 NOTE — ED Provider Notes (Signed)
Medical screening examination/treatment/procedure(s) were conducted as a shared visit with non-physician practitioner(s) and myself.  I personally evaluated the patient during the encounter.     Delman Kitten, MD 01/03/19 1901

## 2019-01-03 NOTE — ED Notes (Signed)
Phone back from pt after pt's husband called 8 min

## 2019-01-03 NOTE — ED Triage Notes (Signed)
Pt reports syncope and fall yesterday with positive LOC, right rib pain since. Reports hitting head during fall. Pt alert and oriented X4, active, cooperative, pt in NAD. RR even and unlabored, color WNL.

## 2019-01-03 NOTE — ED Notes (Signed)
Discharge instructions reviewed with patient. Questions fielded by this RN. Patient verbalizes understanding of instructions. Patient discharged home in stable condition per chris gaiens. No acute distress noted at time of discharge.    Peripheral IV discontinued. Catheter intact. No signs of infiltration or redness. Gauze applied to IV site.

## 2019-01-03 NOTE — ED Notes (Signed)
Pt reports falling yesterday at approx 1500, c/o HA and left side rib pain, pt reports head strike to ground falling from 2 stairs, hx of DM

## 2019-01-07 IMAGING — CR DG CHEST 2V
1 series · 2 of 2 positions shown · non-contrast
Comparison: May 27, 2017

CLINICAL DATA: Bilateral pneumonia.  Cough and congestion.

EXAM:
CHEST  2 VIEW

[Series 1: dg chest 2 view · 0.14mm/px · 2 of 2 slices shown]
[im 1/2]
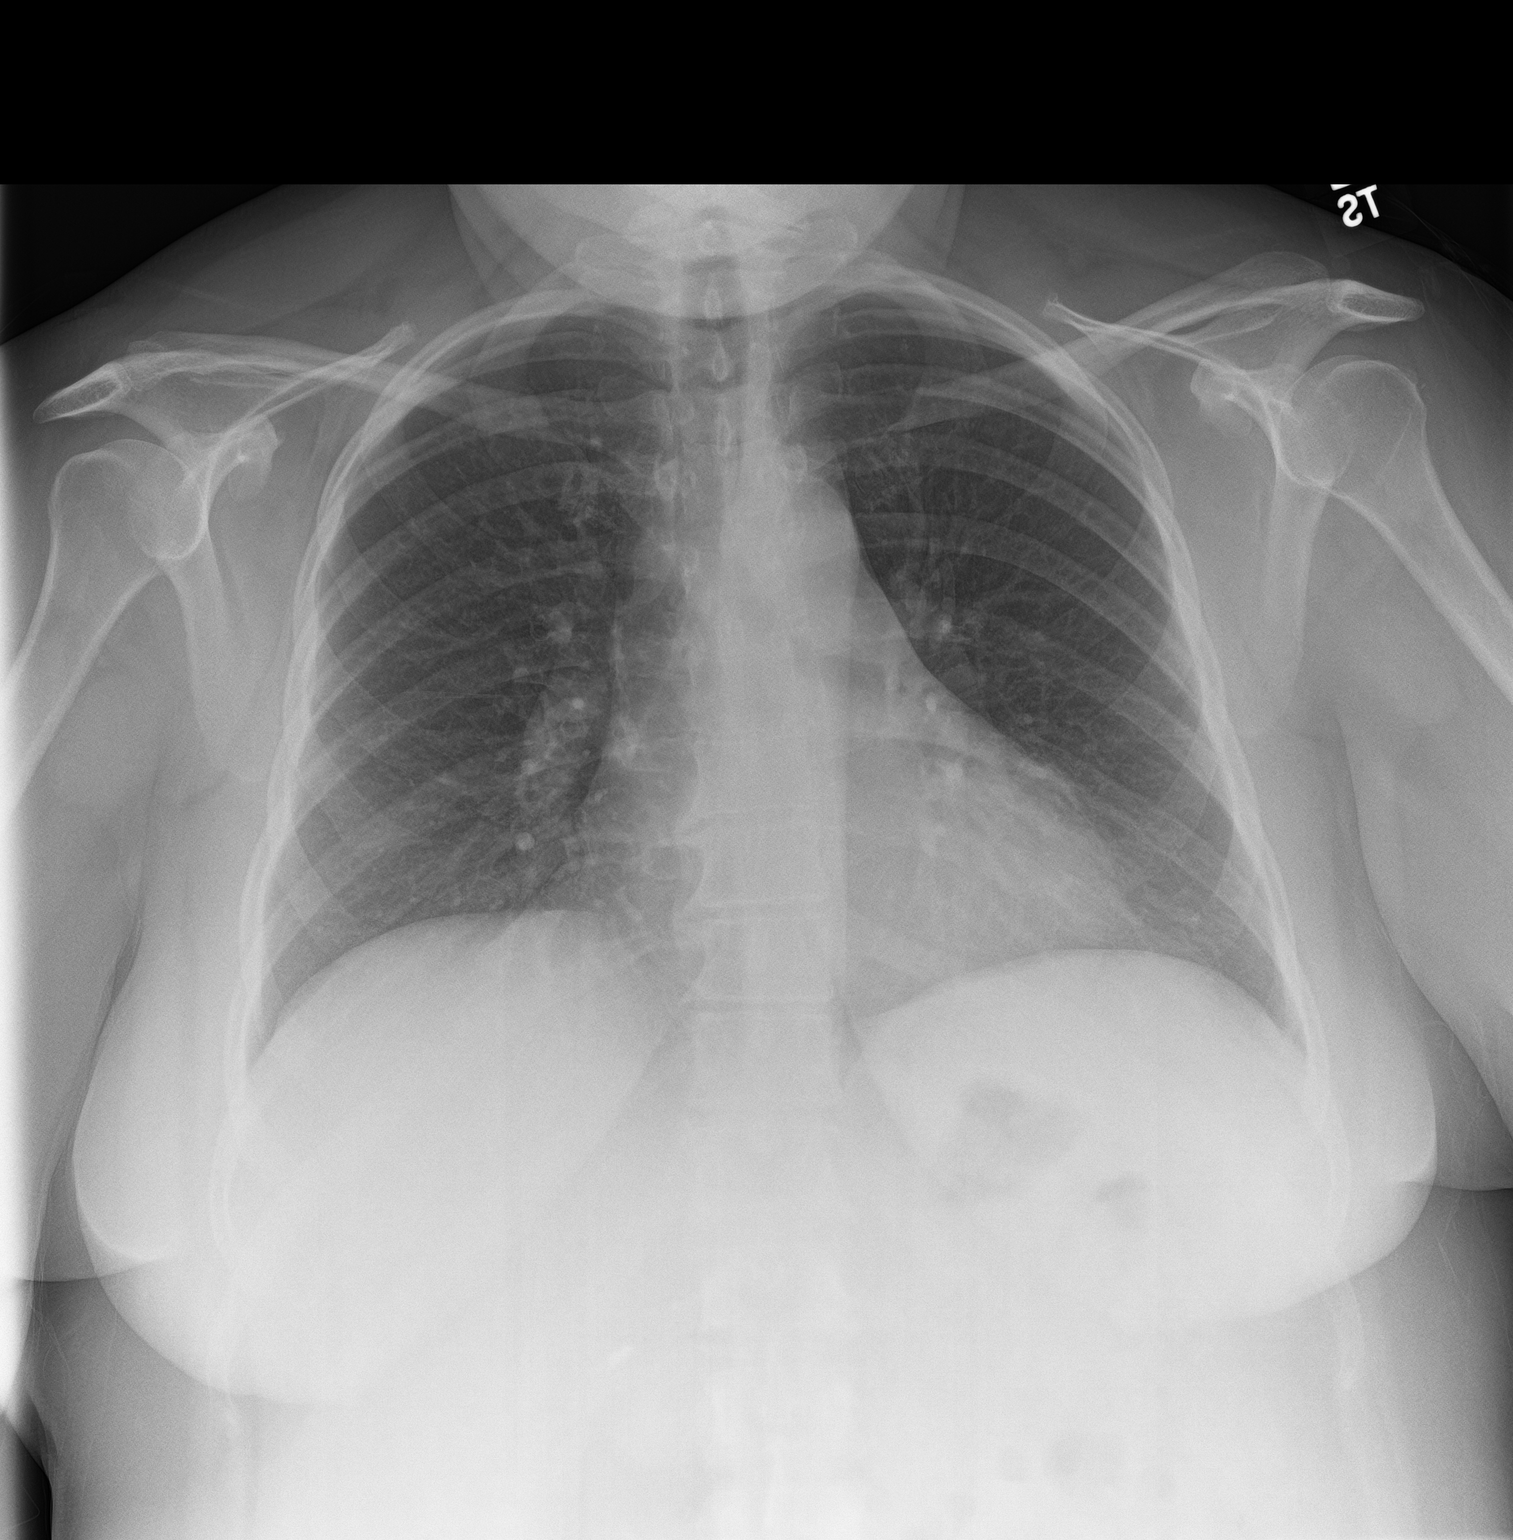
[im 2/2]
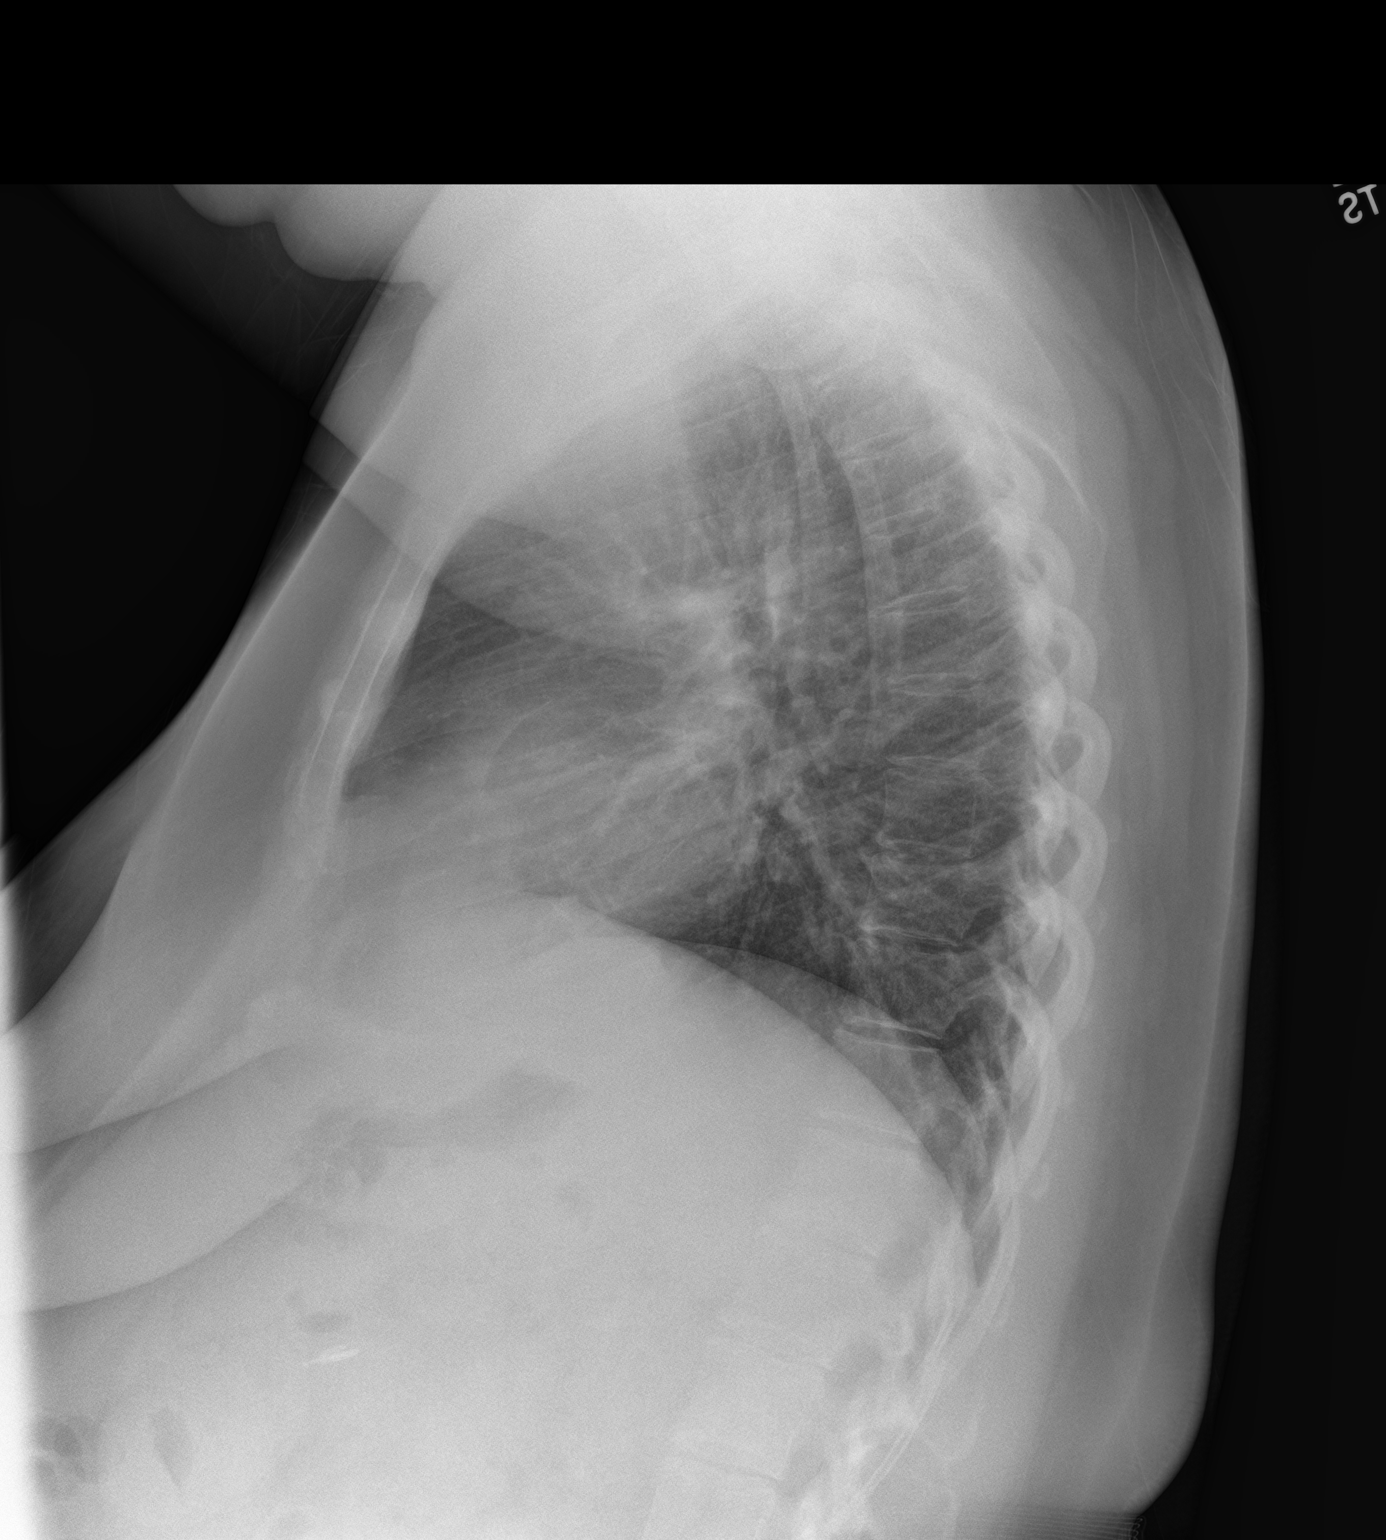

[2 of 2 positions shown; findings below may reference images not displayed]

FINDINGS: The heart, hila, mediastinum, lungs, and pleura are normal. No focal
infiltrate.
IMPRESSION: No active cardiopulmonary disease.

## 2019-01-14 ENCOUNTER — Other Ambulatory Visit: Payer: Self-pay

## 2019-01-14 MED ORDER — LEVOTHYROXINE SODIUM 50 MCG PO TABS: ORAL_TABLET | ORAL | 0 refills | Status: DC

## 2019-01-28 ENCOUNTER — Ambulatory Visit: Payer: Self-pay | Admitting: Urology

## 2019-01-28 IMAGING — CR DG CHEST 2V
1 series · 2 of 2 positions shown · non-contrast
Comparison: Radiographs June 05, 2017.

CLINICAL DATA: Chest pain.

EXAM:
CHEST  2 VIEW

[Series 1: dg chest 2 view · 0.14mm/px · 2 of 2 slices shown]
[im 1/2]
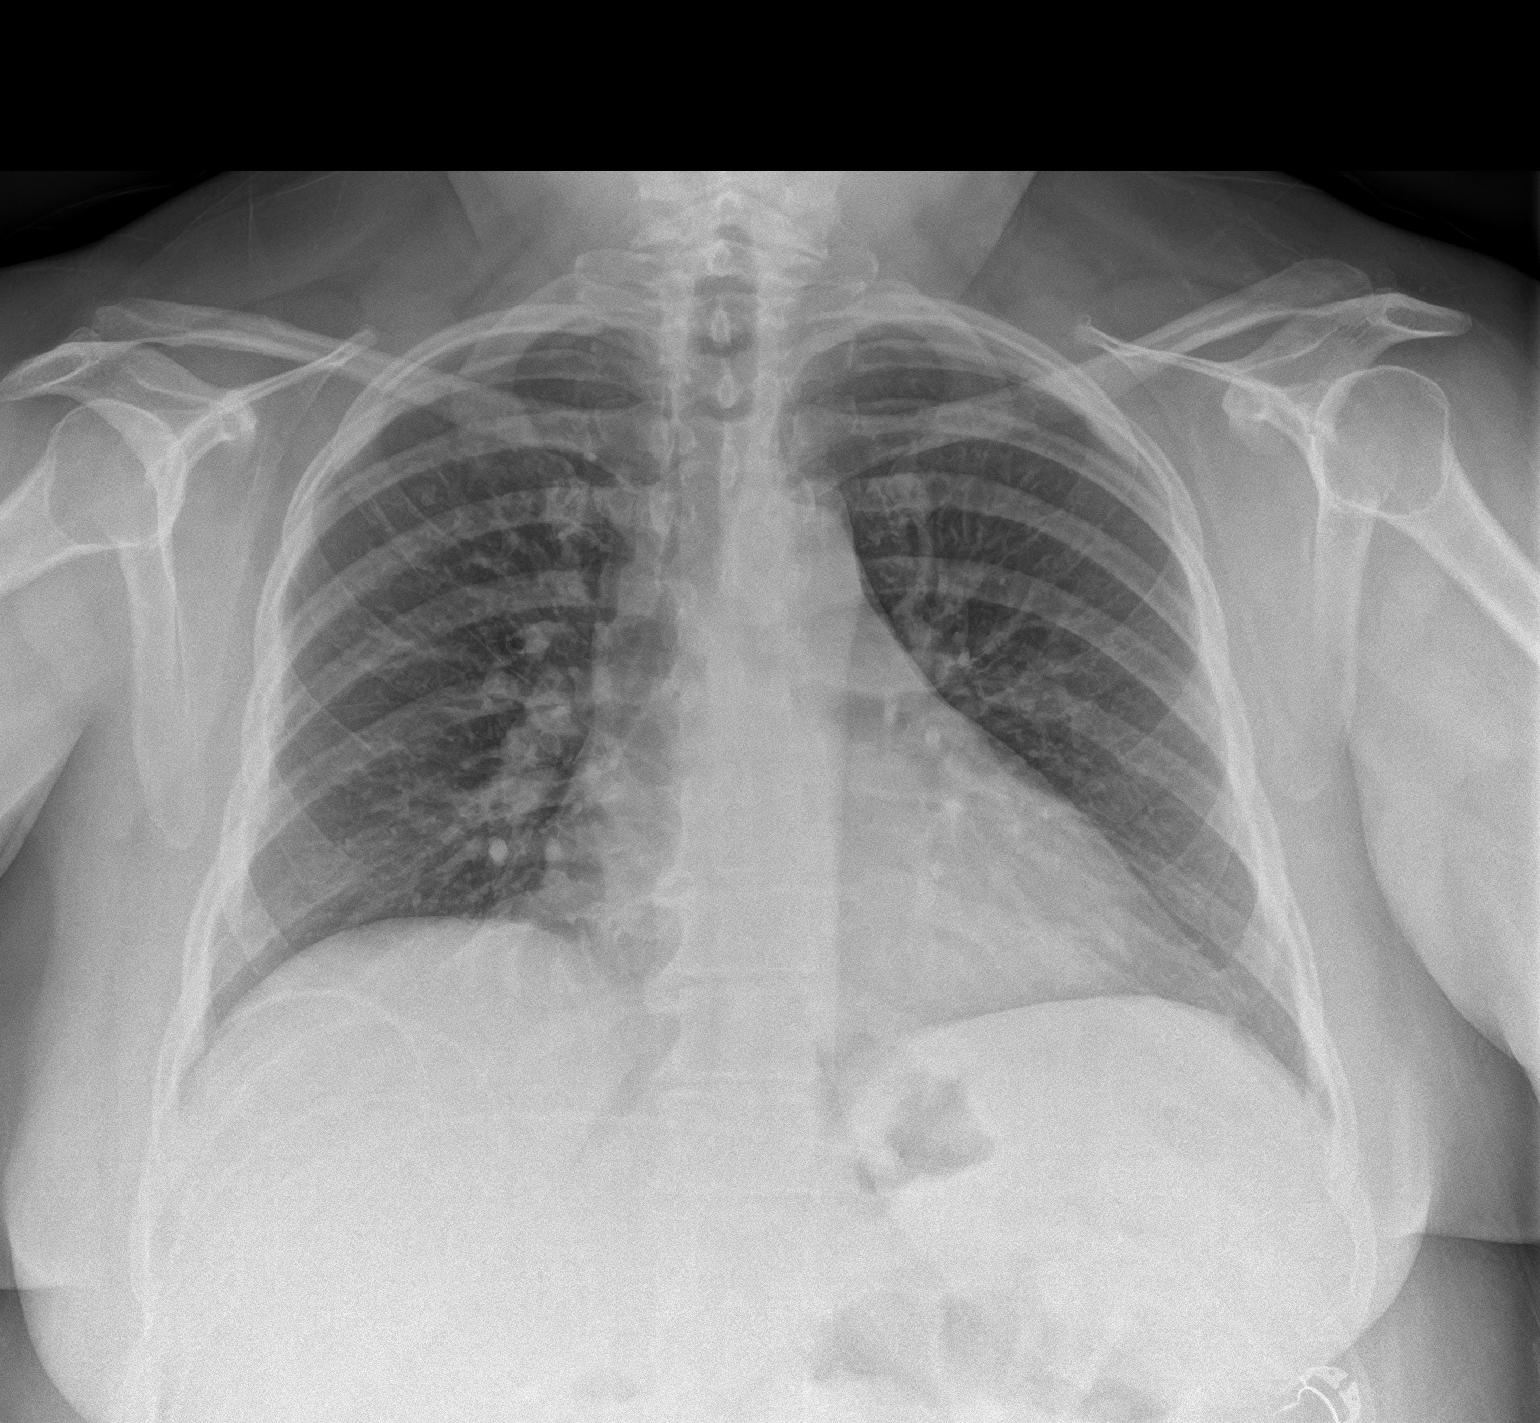
[im 2/2]
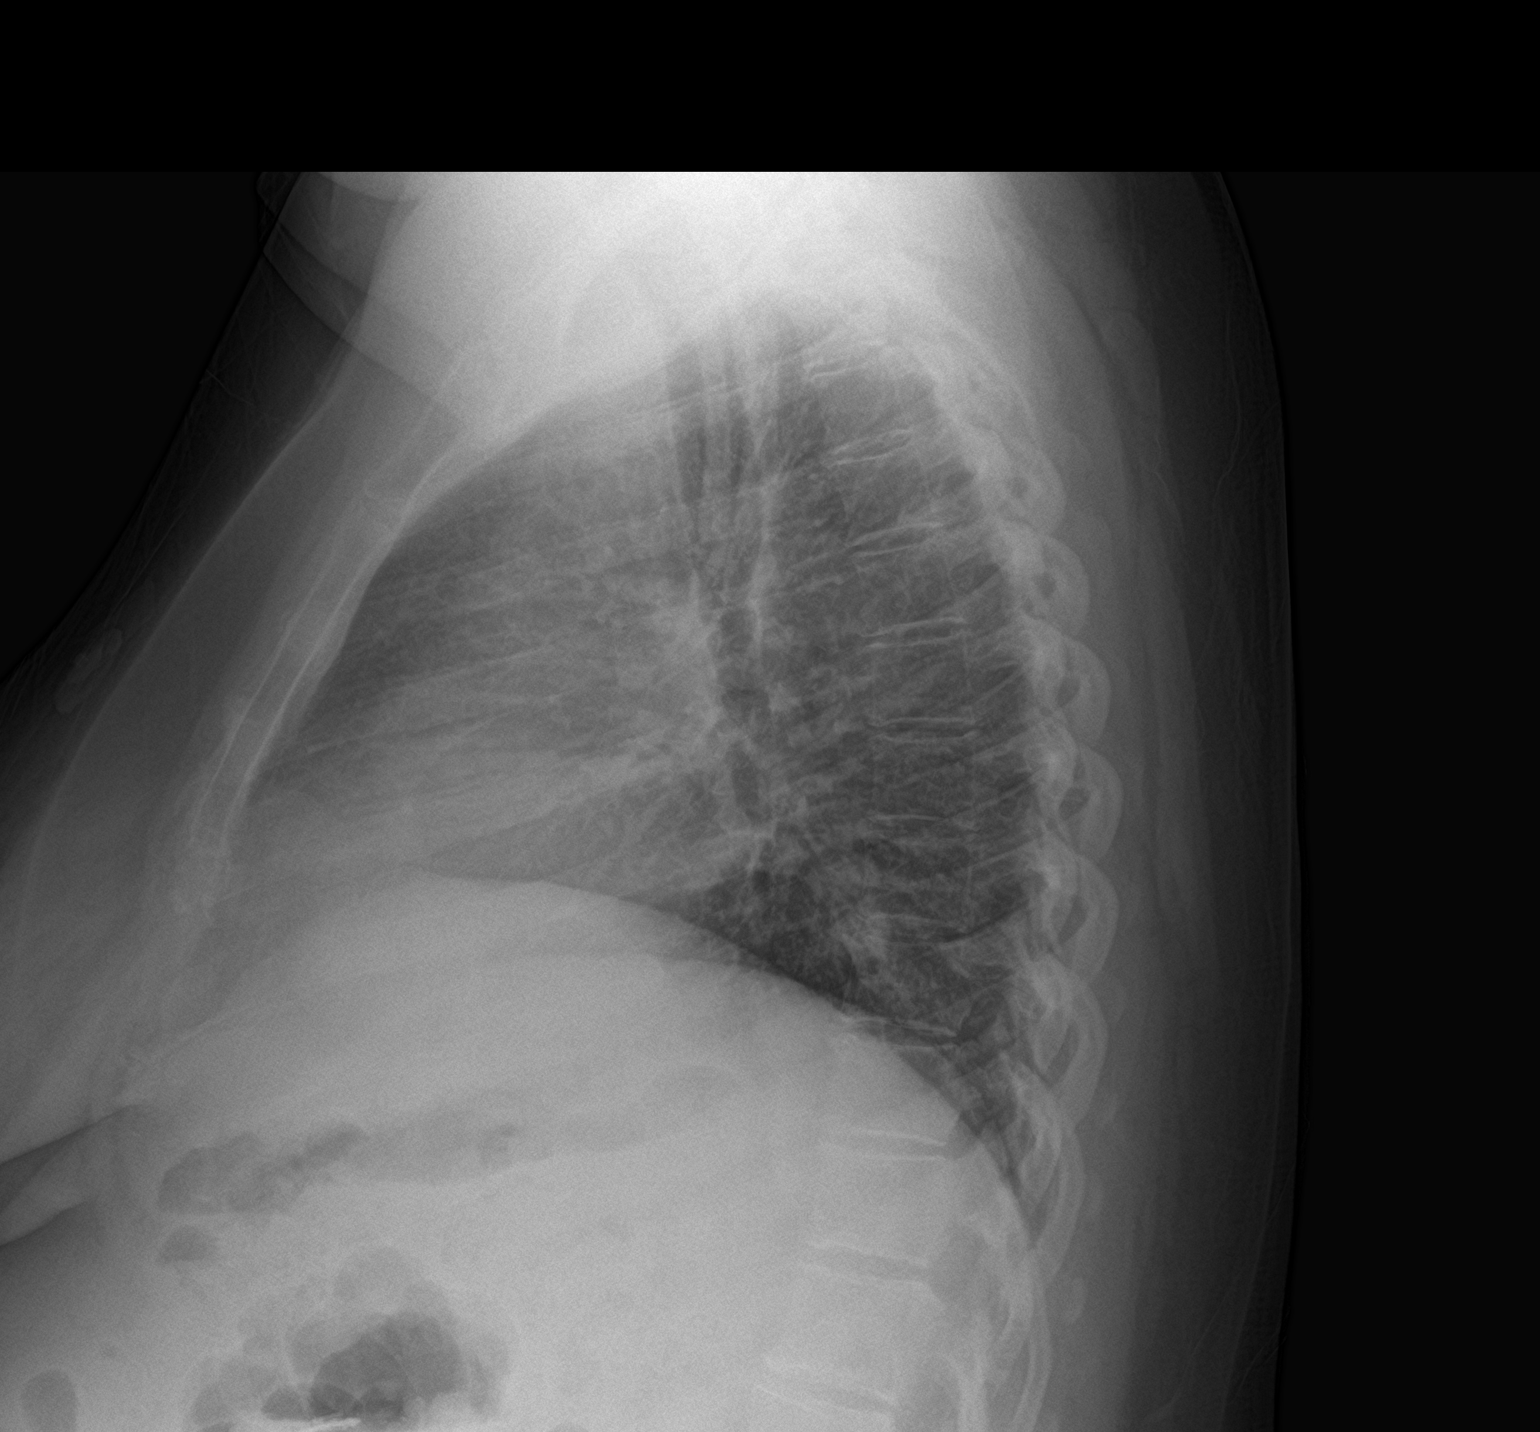

[2 of 2 positions shown; findings below may reference images not displayed]

FINDINGS: The heart size and mediastinal contours are within normal limits.
Both lungs are clear. No pneumothorax or pleural effusion is noted.
The visualized skeletal structures are unremarkable.
IMPRESSION: No active cardiopulmonary disease.

## 2019-02-04 ENCOUNTER — Telehealth: Payer: Self-pay

## 2019-02-04 NOTE — Telephone Encounter (Signed)
Left vm for pt to return my call to schedule Colonoscopy and EGD.

## 2019-02-04 NOTE — Telephone Encounter (Signed)
-----   Message from Glennie Isle, [place] sent at 01/01/2019  1:29 PM EDT -----  ----- Message ----- From: Glennie Isle, CMA Sent: 12/30/2018 To: Glennie [place], CMA  Schedule pt for Colonoscopy and EGD for anemia.

## 2019-02-04 NOTE — Telephone Encounter (Signed)
Pt left vm she is returning a call from our office

## 2019-02-05 ENCOUNTER — Other Ambulatory Visit: Payer: Self-pay

## 2019-02-05 DIAGNOSIS — D500 Iron deficiency anemia secondary to blood loss (chronic): Secondary | ICD-10-CM

## 2019-02-05 DIAGNOSIS — R110 Nausea: Secondary | ICD-10-CM

## 2019-02-05 MED ORDER — ONDANSETRON HCL 4 MG PO TABS: 4.0000 mg | ORAL_TABLET | Freq: Three times a day (TID) | ORAL | 2 refills | Status: DC | PRN

## 2019-02-05 MED ORDER — NA SULFATE-K SULFATE-MG SULF 17.5-3.13-1.6 GM/177ML PO SOLN: 1.0000 | ORAL | 0 refills | Status: DC

## 2019-02-05 NOTE — Telephone Encounter (Signed)
Pt returned call and was scheduled for a colonoscopy and EGD with [facility] on 02/17/19.

## 2019-02-05 NOTE — Telephone Encounter (Signed)
Pt left vm she is calling to schedule procedures

## 2019-02-11 ENCOUNTER — Encounter: Payer: Self-pay | Admitting: *Deleted

## 2019-02-11 ENCOUNTER — Other Ambulatory Visit: Payer: Self-pay

## 2019-02-12 ENCOUNTER — Encounter: Payer: Self-pay | Admitting: Anesthesiology

## 2019-02-13 ENCOUNTER — Other Ambulatory Visit: Payer: Self-pay

## 2019-02-13 ENCOUNTER — Other Ambulatory Visit: Admission: RE | Admit: 2019-02-13 | Payer: Medicare HMO | Source: Ambulatory Visit

## 2019-02-13 DIAGNOSIS — D500 Iron deficiency anemia secondary to blood loss (chronic): Secondary | ICD-10-CM

## 2019-02-14 ENCOUNTER — Telehealth: Payer: Self-pay | Admitting: Family Medicine

## 2019-02-14 NOTE — Telephone Encounter (Signed)
Copied from [place] [id number]. Topic: Quick Communication - Rx Refill/Question >> Feb 14, 2019  4:35 PM Nils Flack wrote: Medication: gabapentin (NEURONTIN) 300 MG capsule and metoprolol tartrate (LOPRESSOR) 25 MG tablet  Has the patient contacted their pharmacy? Yes.   (Agent: If no, request that the patient contact the pharmacy for the refill.) (Agent: If yes, when and what did the pharmacy advise?)  Preferred Pharmacy (with phone number or street name): [facility]    Agent: Please be advised that RX refills may take up to 3 business days. We ask that you follow-up with your pharmacy.

## 2019-02-15 IMAGING — CT CT RENAL STONE PROTOCOL
2 of 5 series · 17 of 46 positions shown, 19 images · non-contrast
Comparison: 06/17/2016 CT abdomen and pelvis.

CLINICAL DATA: 51 y/o F; left lower quadrant abdominal pain and
lower back pain with dysuria.

EXAM:
CT ABDOMEN AND PELVIS WITHOUT CONTRAST
TECHNIQUE: Multidetector CT imaging of the abdomen and pelvis was performed
following the standard protocol without IV contrast.

[Series 2: stone full standard · axial · 0.88mm/px · z∈[-419,-4]mm · 14 of 93 slices shown, 16 images]
[im 5/93  soft-tissue]
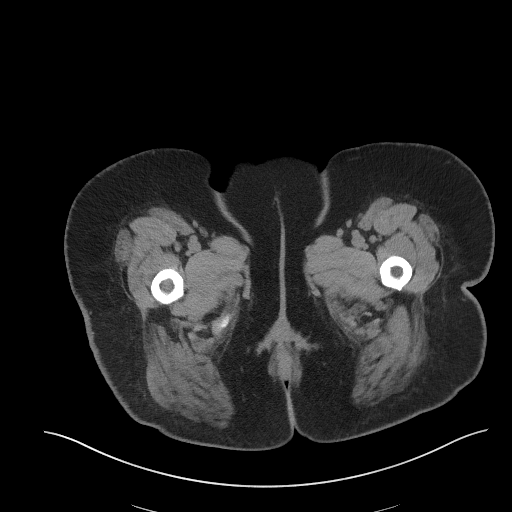
[im 5/93  bone]
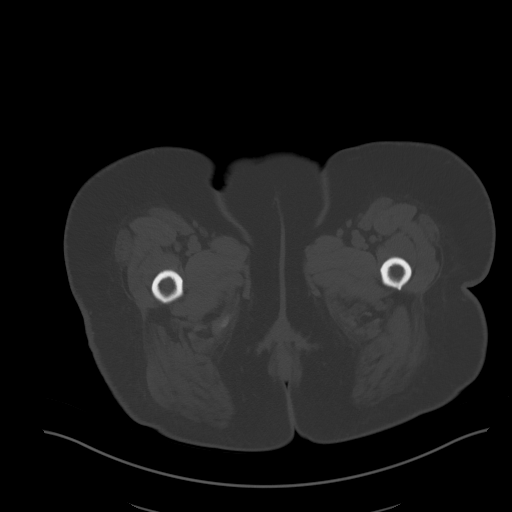
[im 14/93  soft-tissue]
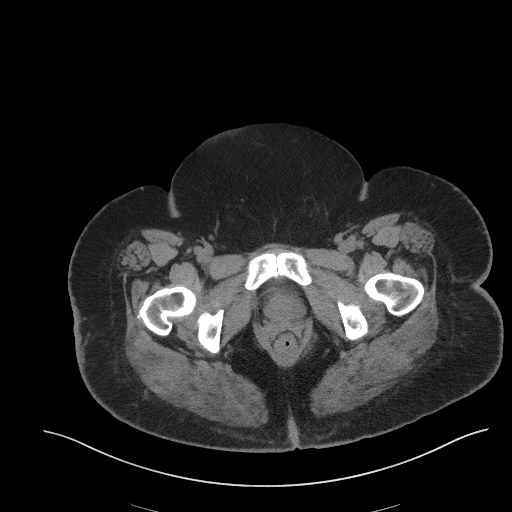
[im 18/93  soft-tissue]
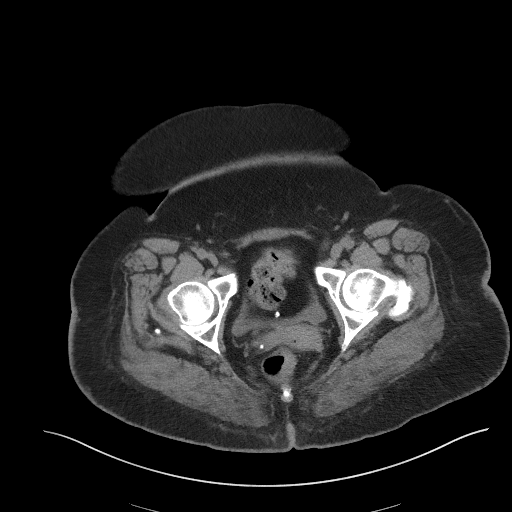
[im 27/93  soft-tissue]
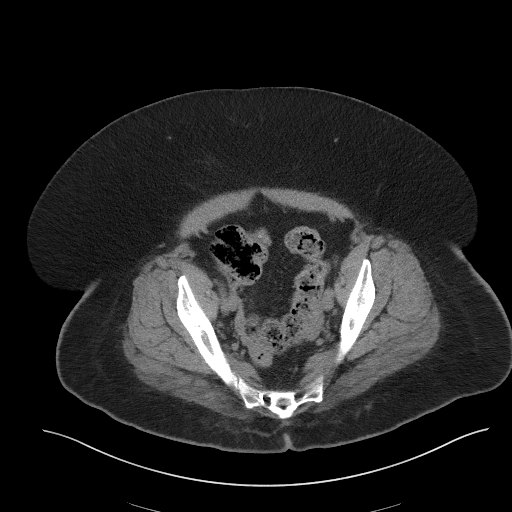
[im 31/93  soft-tissue]
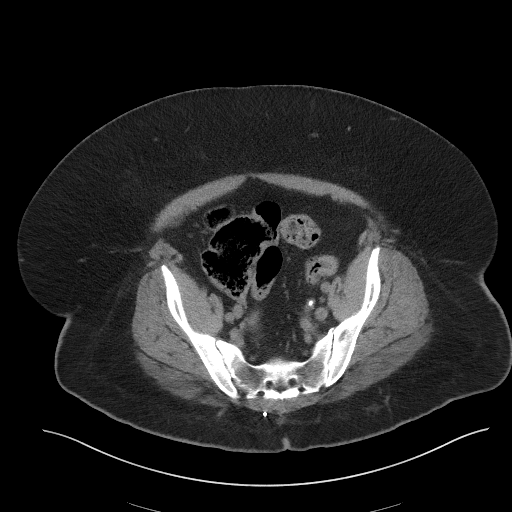
[im 36/93  soft-tissue]
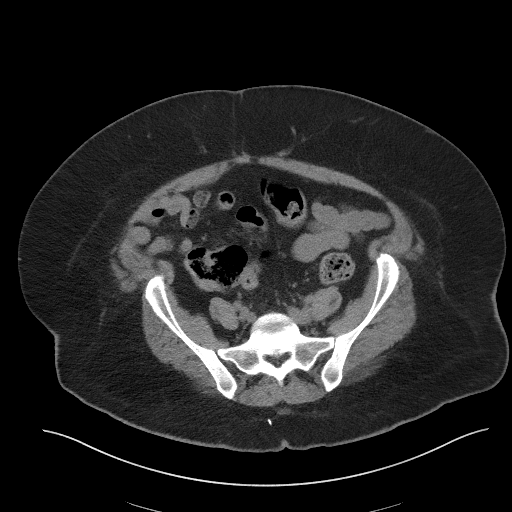
[im 44/93  soft-tissue]
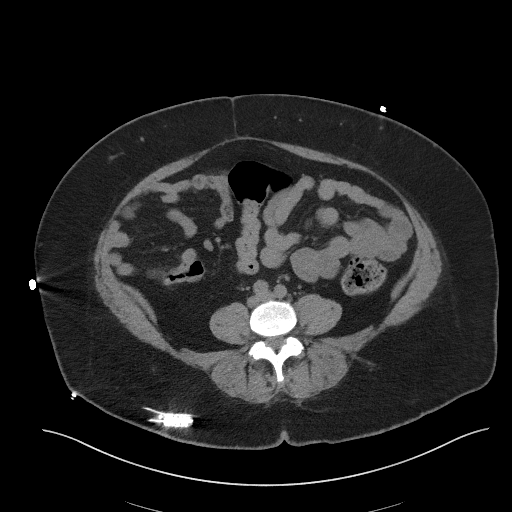
[im 49/93  soft-tissue]
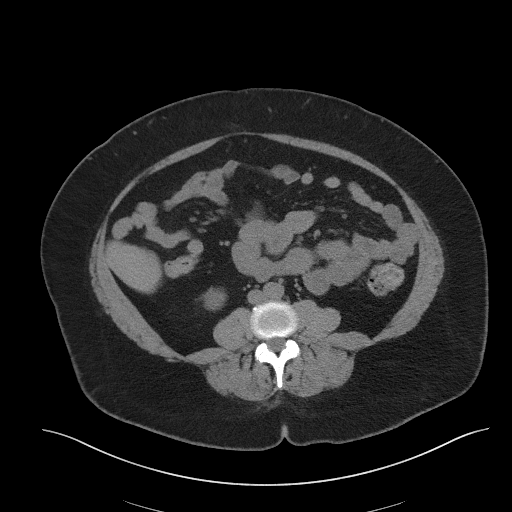
[im 57/93  soft-tissue]
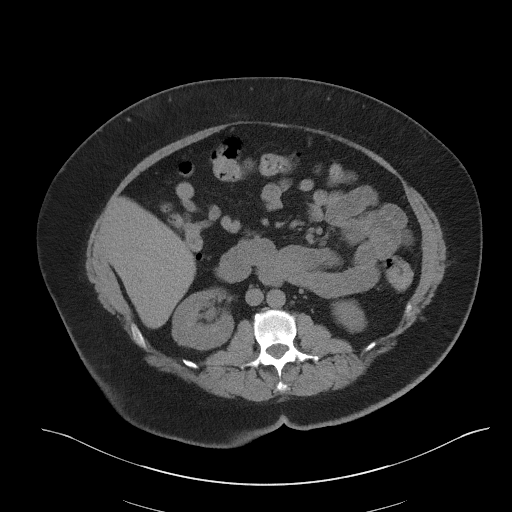
[im 57/93  bone]
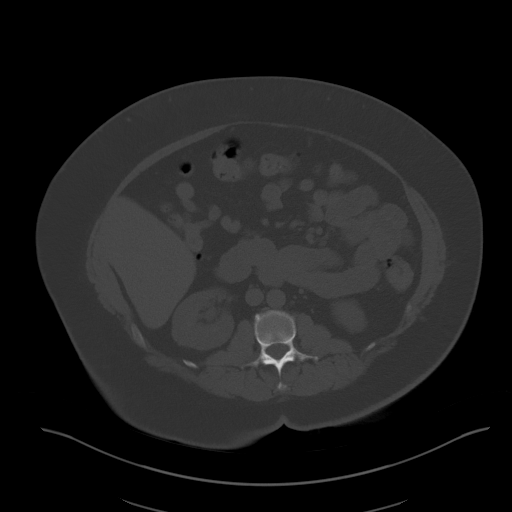
[im 62/93  soft-tissue]
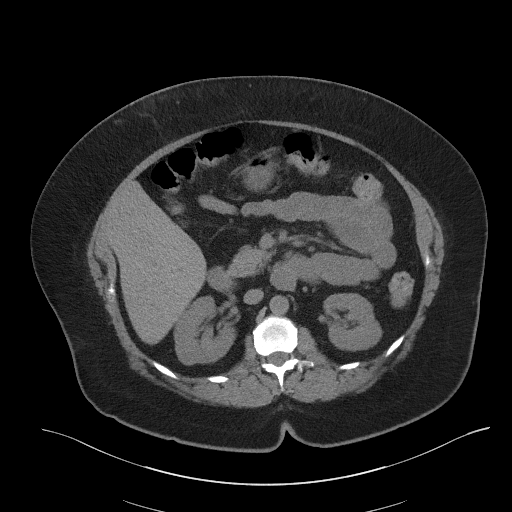
[im 71/93  soft-tissue]
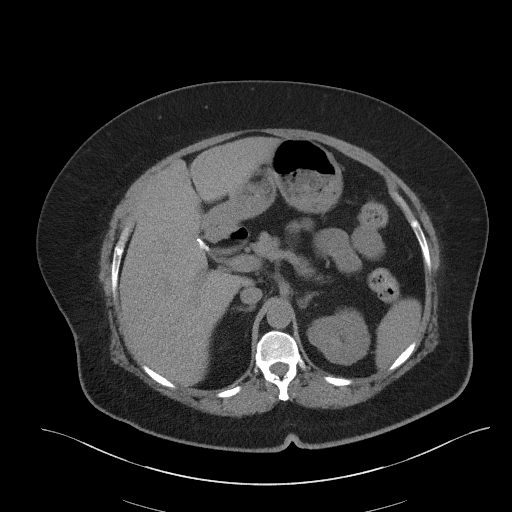
[im 75/93  soft-tissue]
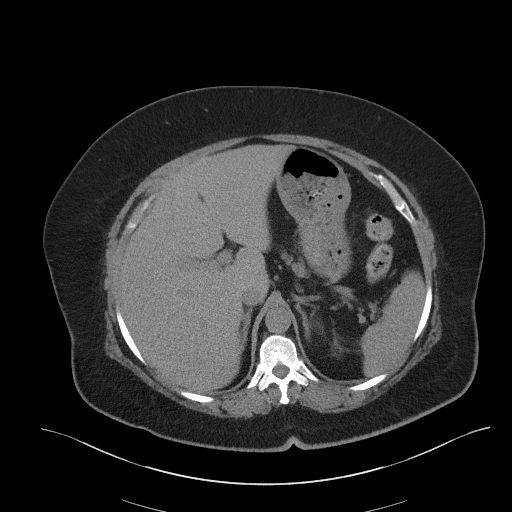
[im 79/93  soft-tissue]
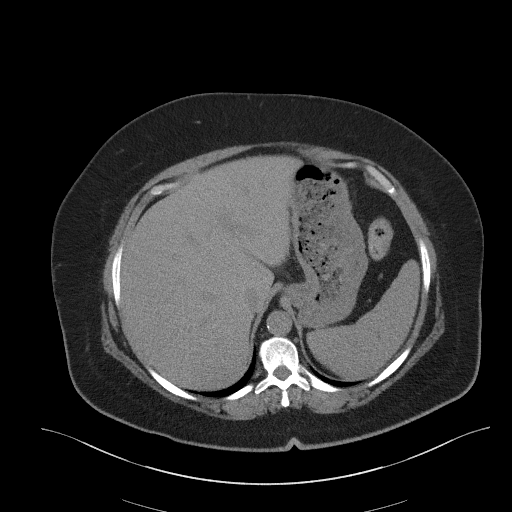
[im 88/93  soft-tissue]
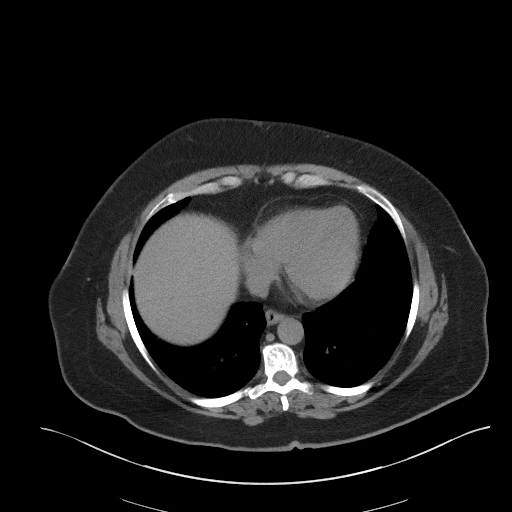

[Series 5: coronal · coronal · 0.82mm/px · 3 of 161 slices shown]
[im 54/161  soft-tissue]
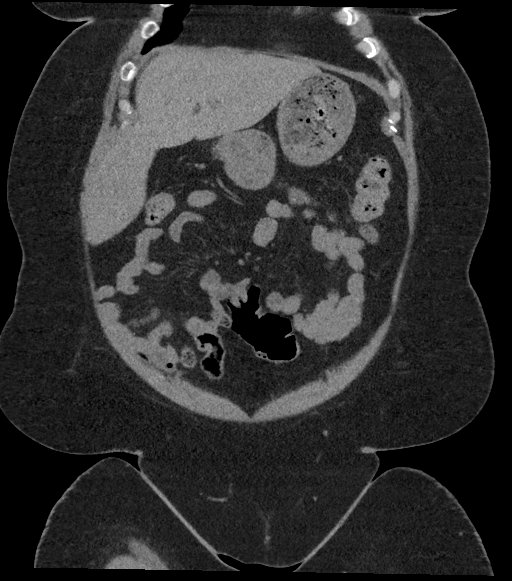
[im 72/161  soft-tissue]
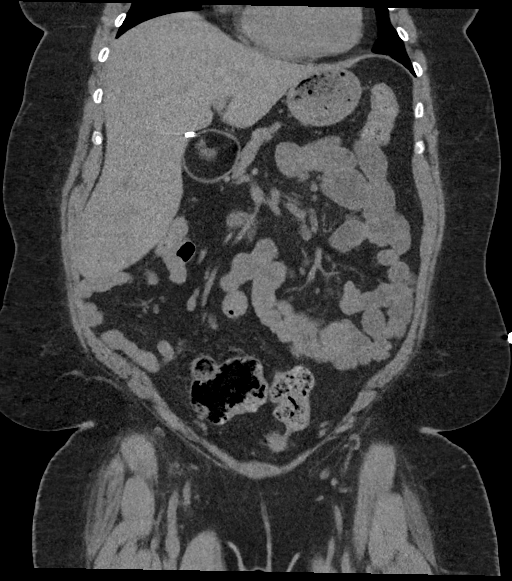
[im 89/161  soft-tissue]
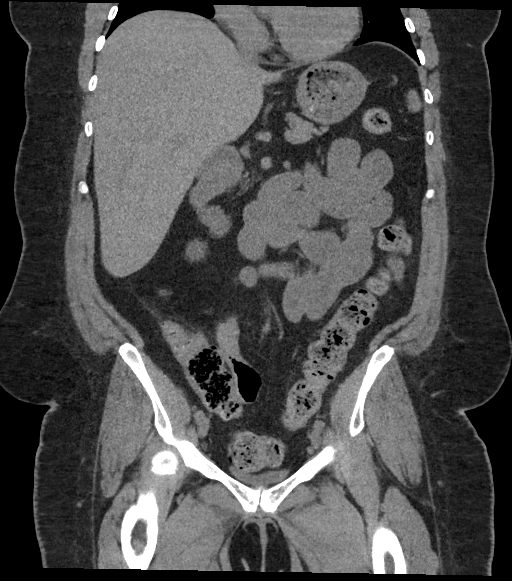

[17 of 46 positions shown; findings below may reference images not displayed]

FINDINGS: Lower chest: No acute abnormality.

Hepatobiliary: No focal liver abnormality is seen. Status post
cholecystectomy. No biliary dilatation.

Pancreas: Unremarkable. No pancreatic ductal dilatation or
surrounding inflammatory changes.

Spleen: Normal in size without focal abnormality.

Adrenals/Urinary Tract: Adrenal glands are unremarkable. Kidneys are
normal, without renal calculi, focal lesion, or hydronephrosis.
Bladder is unremarkable.

Stomach/Bowel: Stomach is within normal limits. Appendix appears
normal. No evidence of bowel wall thickening, distention, or
inflammatory changes.

Vascular/Lymphatic: Aortic atherosclerosis. No enlarged abdominal or
pelvic lymph nodes.

Reproductive: Status post hysterectomy. No adnexal masses.

Other: No abdominal wall hernia or abnormality. No abdominopelvic
ascites.

Musculoskeletal: No acute or significant osseous findings. The trans
device is present within the right flank subcutaneous fat with
electrode extending into the right sacral plexus.
IMPRESSION: 1. No acute process, hydronephrosis, or urinary stone disease
identified.
2. Stable chronic findings as above.

By: Biniam Rainer M.D.

## 2019-02-16 ENCOUNTER — Other Ambulatory Visit: Payer: Self-pay

## 2019-02-16 ENCOUNTER — Emergency Department
Admission: EM | Admit: 2019-02-16 | Discharge: 2019-02-16 | Disposition: A | Discharge status: Home / Self Care | Payer: Medicare HMO | Attending: Emergency Medicine | Admitting: Emergency Medicine

## 2019-02-16 ENCOUNTER — Encounter: Payer: Self-pay | Admitting: Emergency Medicine

## 2019-02-16 DIAGNOSIS — M79645 Pain in left finger(s): Secondary | ICD-10-CM | POA: Diagnosis present

## 2019-02-16 DIAGNOSIS — N189 Chronic kidney disease, unspecified: Secondary | ICD-10-CM | POA: Diagnosis not present

## 2019-02-16 DIAGNOSIS — L03012 Cellulitis of left finger: Secondary | ICD-10-CM | POA: Insufficient documentation

## 2019-02-16 DIAGNOSIS — I509 Heart failure, unspecified: Secondary | ICD-10-CM | POA: Insufficient documentation

## 2019-02-16 DIAGNOSIS — Z79899 Other long term (current) drug therapy: Secondary | ICD-10-CM | POA: Insufficient documentation

## 2019-02-16 DIAGNOSIS — I110 Hypertensive heart disease with heart failure: Secondary | ICD-10-CM | POA: Diagnosis not present

## 2019-02-16 DIAGNOSIS — E119 Type 2 diabetes mellitus without complications: Secondary | ICD-10-CM | POA: Diagnosis not present

## 2019-02-16 MED ORDER — CLINDAMYCIN HCL 150 MG PO CAPS: ORAL_CAPSULE | ORAL | 0 refills | Status: DC

## 2019-02-16 NOTE — ED Notes (Signed)
Pt verbalized understanding of discharge instructions. NAD at this time. 

## 2019-02-16 NOTE — ED Notes (Signed)
Signature pad not working. Hard copy printed and signed by patient.

## 2019-02-16 NOTE — Discharge Instructions (Addendum)
Follow-up with your primary care provider if any continued problems.  Begin taking clindamycin until completely finished.  You will need to soak your hand in warm water or apply warm compresses to your hand which should help improve the infection.  Also did not stick anything in your finger.  You may take Tylenol or ibuprofen with this medication.

## 2019-02-16 NOTE — ED Provider Notes (Signed)
[facility] Emergency Department Provider Note  ____________________________________________   First MD Initiated Contact with Patient 02/16/19 1122     (approximate)  I have reviewed the triage vital signs and the nursing notes.   HISTORY  Chief Complaint Finger Injury   HPI [name] is a 53 y.o. female presents to the ED with complaint of wound to her left fifth digit for 1 week.  Patient states that she initially had a blister that she opened with a needle.  She states that a small amount of pus drained from the area and now has become extremely red and tender.  Tetanus immunization is up-to-date.  She rates her pain as a 9 out of 10.      Past Medical History:  Diagnosis Date  . Anxiety   . Arthritis   . Asthma   . Bipolar depression ([place])   . CHF (congestive heart failure) ([place])   . Chronic headaches    migraines have lessened  . Chronic kidney disease    ddstage 111  . COPD (chronic obstructive pulmonary disease) ([place])   . Coronary artery disease   . Depression   . Diabetic neuropathy ([place])   . Dyspnea    uses inhalers for this  . Dysrhythmia    rapid  . Fatty liver   . GERD (gastroesophageal reflux disease)   . Headache   . Heart attack ([place]) 2009  . Hyperlipidemia   . Hypertension   . Hypomagnesemia   . Hypothyroid   . Irregular heart rhythm   . Liver lesion    Favored to be benign on CT; MRI of abd w/contract in Aug 2016. Liver Biopsy-Negative, March 2016  . Morbid obesity ([place])   . OSA (obstructive sleep apnea)   . Peripheral vascular disease ([place])   . Pinched nerve    Back  . Pneumonia   . RLS (restless legs syndrome)   . Seasonal allergies   . Sleep apnea    uses oxygen at night 2L d/t cpap did not fit  . Type 2 diabetes, uncontrolled, with neuropathy ([place]) 10/15/2015   Overview:  Microalbumin 7.7 12/2010: Hgb A1c 10.2% 12/2010 and 9.7% 03/2011, eye exam 10/2009 [facility]   . Urinary incontinence   . Vitamin D  deficiency     Patient Active Problem List   Diagnosis Date Noted  . Type 2 diabetes with decreased circulation ([place]) 11/07/2018  . Vitamin B12 deficiency 08/08/2018  . Encounter for Medicare annual wellness exam 10/30/2017  . Patient is full code 10/30/2017  . Disorder of bursae of shoulder region 10/09/2017  . Coronary artery disease 10/09/2017  . Plantar fascial fibromatosis 10/09/2017  . Tendinitis of wrist 10/09/2017  . Schizoaffective disorder, bipolar type ([place]) 09/28/2017  . Chest pain 09/10/2017  . Pleuritic chest pain 05/27/2017  . Hypertriglyceridemia 12/21/2016  . Pansinusitis 11/15/2016  . 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 Breast cancer screening 09/28/2016  . Urine frequency 09/28/2016  . Preventative health care 09/28/2016  . Fatigue 05/01/2016  . Urinary tract infection 03/14/2016  . Adenomatous colon polyp 10/15/2015  . Airway hyperreactivity 10/15/2015  . Bipolar affective disorder ([place]) 10/15/2015  . CN (constipation) 10/15/2015  . CAFL (chronic airflow limitation) ([place]) 10/15/2015  . Drug-induced hepatic toxicity 10/15/2015  . Blood in feces 10/15/2015  . HLD (hyperlipidemia) 10/15/2015  . Hypoxemia 10/15/2015  . NASH (nonalcoholic steatohepatitis) 10/15/2015  . Morbid obesity ([place]) 10/15/2015  . Restless leg  syndrome 10/15/2015  . Type 2 diabetes, uncontrolled, with neuropathy ([place]) 10/15/2015  . Elevated alkaline phosphatase level 09/22/2015  . Urinary bladder incontinence 09/22/2015  . Low back pain with left-sided sciatica 08/18/2015  . Encounter for monitoring tricyclic antidepressant therapy 05/13/2015  . Bipolar disorder ([place]) 05/11/2015  . Liver disease 05/11/2015  . Apnea, sleep 05/05/2015  . Heart valve disease 05/05/2015  . Chronic kidney disease, stage III (moderate) (HCC) 03/15/2015  . Migraine 03/12/2015  . COPD (chronic obstructive pulmonary disease) ([place]) 01/20/2015  . 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[place])  01/20/2015  . GERD (gastroesophageal reflux disease) 01/20/2015  . Hypothyroid   . Vitamin D deficiency   . Urinary incontinence   . Liver lesion   . Hypomagnesemia   . Obstructive sleep apnea 10/20/2013  . Vaginitis 12/19/2011  . PERIPHERAL NEUROPATHY 03/04/2009  . HEMORRHOIDS, EXTERNAL 09/03/2008  . ALLERGIC RHINITIS 07/04/2007  . ASTHMA, PERSISTENT, MODERATE 07/04/2007  . INTERSTITIAL CYSTITIS 07/04/2007  . HYPERTENSION, BENIGN ESSENTIAL 06/19/2007    Past Surgical History:  Procedure Laterality Date  . ABDOMINAL HYSTERECTOMY    . ABDOMINAL HYSTERECTOMY     Partial  . bladder stim  2007  . BLADDER SURGERY     x 2. tacking of bladder and the other was the stimulator placement  . CHOLECYSTECTOMY    . INTERSTIM-GENERATOR CHANGE N/A 12/31/2017   Procedure: INTERSTIM-GENERATOR CHANGE;  Surgeon: Bjorn Loser, MD;  Location: [facility] ORS;  Service: Urology;  Laterality: N/A;  . LIVER BIOPSY  March 2016   Negative  . LUNG BIOPSY  2015   small spot on lung  . TUBAL LIGATION      Prior to Admission medications   Medication Sig Start Date End Date Taking? Authorizing Provider  albuterol (PROVENTIL HFA;VENTOLIN HFA) 108 (90 Base) MCG/ACT inhaler Inhale 2 puffs into the lungs every 4 (four) hours as needed for wheezing or shortness of breath. 10/04/18   Arnetha Courser, MD  atorvastatin (LIPITOR) 80 MG tablet Take 80 mg by mouth at bedtime.     [provider]  budesonide-formoterol (SYMBICORT) 160-4.5 MCG/ACT inhaler Inhale 2 puffs into the lungs 2 (two) times daily. 10/04/18   Lada, Satira Anis, MD  busPIRone (BUSPAR) 10 MG tablet Take 10 mg by mouth 2 (two) times daily.    [provider]  clindamycin (CLEOCIN) 150 MG capsule 2 tabs tid 02/16/19   Johnn Hai, PA-C  DULoxetine (CYMBALTA) 60 MG capsule Take 120 mg by mouth daily.    [provider]  empagliflozin (JARDIANCE) 25 MG TABS tablet Take 25 mg by mouth daily.    [provider]   ezetimibe (ZETIA) 10 MG tablet Take 10 mg by mouth daily.  01/05/17   [provider]  fluticasone (FLONASE) 50 MCG/ACT nasal spray Place 2 sprays into both nostrils daily. 04/16/18   Arnetha Courser, MD  gabapentin (NEURONTIN) 300 MG capsule Take 1 capsule (300 mg total) by mouth 2 (two) times daily. Please schedule appointment to receive further refills. Patient not taking: Reported on 02/12/2019 10/24/18   Edrick Kins, DPM  HUMULIN R U-500 KWIKPEN 500 UNIT/ML kwikpen Inject 70 Units as directed 3 (three) times daily.     [provider]  hydrOXYzine (VISTARIL) 50 MG capsule Take 50-100 mg by mouth daily as needed for anxiety.     [provider]  insulin aspart (NOVOLOG) 100 UNIT/ML injection Inject 5-25 Units into the skin 3 (three) times daily before meals.     [provider]  lamoTRIgine (LAMICTAL) 200 MG tablet Take 200 mg by mouth 2 (two) times daily.     [provider]  levothyroxine (SYNTHROID) 50 MCG tablet One by mouth daily three days a week, one and one-half pills four days a week (Mon, Wed, Fri, Sat for example) needs labs for further refills 01/14/19   Poulose, Bethel Born, NP  lisinopril (PRINIVIL,ZESTRIL) 2.5 MG tablet Take 1 tablet (2.5 mg total) by mouth daily. 12/17/18   Arnetha Courser, MD  metoprolol tartrate (LOPRESSOR) 25 MG tablet Take one tablet 25 mg in the am and 1/2 tab in the pm 12/16/18   Minna Merritts, MD  mupirocin ointment (BACTROBAN) 2 % Place 1 application into the nose 2 (two) times daily. Patient not taking: Reported on 12/24/2018 11/22/18   Hubbard Hartshorn, FNP  Na Sulfate-K Sulfate-Mg Sulf (SUPREP BOWEL PREP KIT) 17.5-3.13-1.6 GM/177ML SOLN Take 1 kit by mouth as directed. 02/05/19   [name], MD  Omega-3 Fatty Acids (FISH OIL PO) Take by mouth daily.    [provider]  ondansetron (ZOFRAN) 4 MG tablet Take 1 tablet (4 mg total) by mouth every 8 (eight) hours as needed for nausea or vomiting. Patient not  taking: Reported on 02/12/2019 02/05/19   [name], MD  oxyCODONE (ROXICODONE) 5 MG immediate release tablet Take 1 tablet (5 mg total) by mouth every 8 (eight) hours as needed. Patient not taking: Reported on 02/12/2019 01/03/19 01/03/20  Duanne Guess, PA-C  pantoprazole (PROTONIX) 40 MG tablet Take 1 tablet (40 mg total) by mouth daily. 11/06/18   [name], MD  QUEtiapine (SEROQUEL) 400 MG tablet Take 400 mg by mouth at bedtime.    [provider]  traZODone (DESYREL) 100 MG tablet Take 100 mg by mouth at bedtime.    [provider]  TRULICITY 1.5 XK/4.8JE SOPN Inject 1.5 mLs as directed once a week.  03/26/18   [provider]  ULTICARE SHORT PEN NEEDLES 31G X 8 MM [place]  07/31/18   [provider]    Allergies Mirtazapine, Morphine and related, Sulfamethoxazole-trimethoprim, Amoxicillin er, Codeine, Hydrocodone-acetaminophen, Keflex [cephalexin], and Prasterone  Family History  Problem Relation Age of Onset  . Diabetes Mother   . Heart disease Mother   . Hyperlipidemia Mother   . Hypertension Mother   . Kidney disease Mother   . Mental illness Mother   . Hypothyroidism Mother   . Stroke Mother   . Osteoporosis Mother   . Glaucoma Mother   . Congestive Heart Failure Mother   . Hypertension Father   . Asthma Daughter   . Cancer Daughter        throat  . Bipolar disorder Daughter   . Heart disease Maternal Grandfather   . Rheum arthritis Maternal Grandfather   . Cancer Maternal Grandfather        liver  . Kidney disease Maternal Grandfather   . Cancer Paternal Grandmother        lung  . Kidney disease Brother   . Breast cancer Maternal Grandmother 60  . Bipolar disorder Daughter   . Hypertension Daughter   . Restless legs syndrome Daughter     Social History Social History   Tobacco Use  . Smoking status: Never Smoker  . Smokeless tobacco: Never Used  Substance Use Topics  . Alcohol use: No  . Drug use: No    Review of  Systems Constitutional: No fever/chills Cardiovascular: Denies chest pain. Respiratory: Denies shortness of  breath. Musculoskeletal: Painful left fifth finger. Skin: Erythematous left fifth digit. Neurological: Negative for headaches, focal weakness or numbness. ____________________________________________   PHYSICAL EXAM:  VITAL SIGNS: ED Triage Vitals  Enc Vitals Group     BP 02/16/19 0957 116/65     Pulse Rate 02/16/19 0957 (!) 104     Resp 02/16/19 0957 16     Temp 02/16/19 0957 98.3 F (36.8 C)     Temp Source 02/16/19 0957 Oral     SpO2 02/16/19 0957 96 %     Weight --      Height --      Head Circumference --      Peak Flow --      Pain Score 02/16/19 0958 9     Pain Loc --      Pain Edu? --      Excl. in [place]? --    Constitutional: Alert and oriented. Well appearing and in no acute distress. Eyes: Conjunctivae are normal. PERRL. EOMI. Head: Atraumatic. Neck: No stridor.   Cardiovascular: Normal rate, regular rhythm. Grossly normal heart sounds.  Good peripheral circulation. Respiratory: Normal respiratory effort.  No retractions. Lungs CTAB. Musculoskeletal: On examination of left fifth digit there is erythema and soft tissue tenderness at the base of the fingernail.  There is an open wound that currently is not draining but is extremely tender to touch.  Patient is able to flex and extend but limited due to increased pain.  Motor sensory function intact.  Capillary refills less than 3 seconds.  Infection at this time is contained to the dorsal aspect at the base of the nail. Neurologic:  Normal speech and language. No gross focal neurologic deficits are appreciated.  Skin:  Skin is warm, dry and intact. No rash noted. Psychiatric: Mood and affect are normal. Speech and behavior are normal.  ____________________________________________   LABS (all labs ordered are listed, but only abnormal results are displayed)  Labs Reviewed - No data to display  PROCEDURES   Procedure(s) performed (including Critical Care):  Procedures   ____________________________________________   INITIAL IMPRESSION / ASSESSMENT AND PLAN / ED COURSE  As part of my medical decision making, I reviewed the following data within the electronic MEDICAL RECORD NUMBER Notes from prior ED visits and [place] Controlled Substance Database  53 year old female presents to the ED with complaint of wound to her left fifth finger for 1 week.  Patient states that she opened the blister with a needle and it did drain pus from the area.  She denied any known trauma to her finger.  Visual exam is consistent with diagnosis of paronychia.  Patient was placed on antibiotics.  Patient has allergies to multiple antibiotics.  She was placed on clindamycin for 7 days.  She is to follow-up with her PCP if any continued problems.   ____________________________________________   FINAL CLINICAL IMPRESSION(S) / ED DIAGNOSES  Final diagnoses:  Paronychia of left little finger     ED Discharge Orders         Ordered    clindamycin (CLEOCIN) 150 MG capsule     02/16/19 1137           Note:  This document was prepared using Dragon voice recognition software and may include unintentional dictation errors.    Johnn Hai, PA-C 02/16/19 1451    Duffy Bruce, MD 02/19/19 1015

## 2019-02-16 NOTE — ED Triage Notes (Signed)
Pt to ED via POV c/o wound her on left pinky finger x 1 week. Pt states that she had a blister that she poked a hole in. Pt has a small amount of redness around the finger and a small amount of pus draining. Pt is in NAD.

## 2019-02-17 ENCOUNTER — Ambulatory Visit: Admit: 2019-02-17 | Payer: Medicare HMO | Admitting: Gastroenterology

## 2019-02-17 SURGERY — COLONOSCOPY WITH PROPOFOL
Anesthesia: Choice

## 2019-02-17 NOTE — Telephone Encounter (Signed)
Refuses not the current presciber

## 2019-02-18 ENCOUNTER — Other Ambulatory Visit: Payer: Self-pay | Admitting: Family Medicine

## 2019-02-18 ENCOUNTER — Telehealth: Payer: Self-pay | Admitting: Family Medicine

## 2019-02-18 ENCOUNTER — Other Ambulatory Visit: Payer: Self-pay | Admitting: *Deleted

## 2019-02-18 MED ORDER — LEVOTHYROXINE SODIUM 50 MCG PO TABS: ORAL_TABLET | ORAL | 0 refills | Status: DC

## 2019-02-18 NOTE — Telephone Encounter (Signed)
Copied from [place] [id number]. Topic: Quick Communication - Rx Refill/Question >> Feb 18, 2019  8:01 AM Celene Kras A wrote: Medication: gabapentin (NEURONTIN) 300 MG capsule  Has the patient contacted their pharmacy? No. (Agent: If no, request that the patient contact the pharmacy for the refill.) (Agent: If yes, when and what did the pharmacy advise?)  Preferred Pharmacy (with phone number or street name): [facility] [address] [place], [place] [address] Phone: [phone] Fax: [phone] Not a 24 hour pharmacy; exact hours not known.    Agent: Please be advised that RX refills may take up to 3 business days. We ask that you follow-up with your pharmacy.

## 2019-02-18 NOTE — Telephone Encounter (Signed)
Pt is OD for 12 month f/u LS 09/2017. Pt needs f/u appointment.

## 2019-02-18 NOTE — Telephone Encounter (Signed)
Patient is due for labs and appointment.

## 2019-02-18 NOTE — Telephone Encounter (Signed)
Copied from [place] [id number] [phone]. Topic: Quick Communication - Rx Refill/Question >> Feb 18, 2019  1:44 PM Scherrie Gerlach wrote: Medication: levothyroxine (SYNTHROID) 50 MCG tablet  Pharmacy calling for refill of this med.  [facility] [address] [place], [place] - [place] [phone] (Phone) [phone] (Fax)

## 2019-02-18 NOTE — Telephone Encounter (Signed)
Needs to contact prescribing provider- Edrick Kins, DPM

## 2019-02-18 NOTE — Telephone Encounter (Signed)
Pharmacy calling in stating that they are in need of getting the refill today. States they have to bubble pack them and pharmacy is closing at 6. Please advise. States patient does not have any pills.

## 2019-02-19 NOTE — Telephone Encounter (Signed)
Pharmacy notified.

## 2019-02-19 NOTE — Telephone Encounter (Signed)
appt scheduled for tomorrow with Benjamine Mola.

## 2019-02-20 ENCOUNTER — Other Ambulatory Visit: Payer: Self-pay

## 2019-02-20 ENCOUNTER — Ambulatory Visit (INDEPENDENT_AMBULATORY_CARE_PROVIDER_SITE_OTHER): Payer: Medicare HMO | Admitting: Nurse Practitioner

## 2019-02-20 ENCOUNTER — Encounter: Payer: Self-pay | Admitting: Nurse Practitioner

## 2019-02-20 DIAGNOSIS — J449 Chronic obstructive pulmonary disease, unspecified: Secondary | ICD-10-CM | POA: Diagnosis not present

## 2019-02-20 DIAGNOSIS — I10 Essential (primary) hypertension: Secondary | ICD-10-CM

## 2019-02-20 DIAGNOSIS — E038 Other specified hypothyroidism: Secondary | ICD-10-CM | POA: Diagnosis not present

## 2019-02-20 DIAGNOSIS — IMO0002 Reserved for concepts with insufficient information to code with codable children: Secondary | ICD-10-CM

## 2019-02-20 DIAGNOSIS — E1140 Type 2 diabetes mellitus with diabetic neuropathy, unspecified: Secondary | ICD-10-CM | POA: Diagnosis not present

## 2019-02-20 DIAGNOSIS — E119 Type 2 diabetes mellitus without complications: Secondary | ICD-10-CM

## 2019-02-20 MED ORDER — LEVOTHYROXINE SODIUM 50 MCG PO TABS: ORAL_TABLET | ORAL | 0 refills | Status: DC

## 2019-02-20 MED ORDER — METOPROLOL TARTRATE 25 MG PO TABS: ORAL_TABLET | ORAL | 0 refills | Status: DC

## 2019-02-20 MED ORDER — GABAPENTIN 300 MG PO CAPS: 300.0000 mg | ORAL_CAPSULE | Freq: Two times a day (BID) | ORAL | 0 refills | Status: DC

## 2019-02-20 NOTE — Progress Notes (Signed)
Virtual Visit via Telephone Note  I connected with [name] on 02/20/19 at  9:40 AM EDT by telephone and verified that I am speaking with the correct person using two identifiers.   Staff discussed the limitations, risks, security and privacy concerns of performing an evaluation and management service by telephone and the availability of in person appointments. Staff also discussed with the patient that there may be a patient responsible charge related to this service. The patient expressed understanding and agreed to proceed.  Patients location: home My location: home office  Other people in meeting: none    HPI  Hypertension Patient is on metoprolol 1m daily, lisinopril 2.541mdaily.  Takes medications as prescribed with no missed doses a month. She says her husband checks her blood pressures at home daily and its always good but cannot recall any readings.  Denies chest pain, headaches, blurry vision.  COPD Takes symbiccort 2 puffs BID and albuterol PRN, wears oxygen at night  Diabetes Sees Dr. MoRonnald Collumvery 3 months, is on ACEi for nephroprotection and statin. She has neuropathy in feet has been out of gabapentin for a month and states her feet have been very painful.  Fasting this morning was 179; denies hypoglycemia  Lab Results  Component Value Date   HGBA1C 8.3 08/26/2018   Hypothyroidism Patient is taking her meds as prescribed states it is in bubble pack for her so she does not exactly recall what and how much but takes everything she is supposed to for the day. She will come in tomorrow for labs.  Lab Results  Component Value Date   TSH 0.36 (L) 10/04/2018     PHQ2/9: [facility] 02/20/2019 11/22/2018 11/07/2018 10/29/2018 10/21/2018  Decreased Interest 0 0 2 0 0  Down, Depressed, Hopeless 0 0 0 0 0  PHQ - 2 Score 0 0 2 0 0  Altered sleeping 0 0 2 0 0  Tired, decreased energy 0 0 2 0 3  Change in appetite 0 - 1 0 0  Feeling bad or failure about  yourself  0 0 0 0 0  Trouble concentrating 0 0 1 0 0  Moving slowly or fidgety/restless 0 0 3 0 0  Suicidal thoughts 0 0 0 - 0  PHQ-9 Score 0 0 11 0 3  Difficult doing work/chores Not difficult at all Not difficult at all Somewhat difficult Not difficult at all Not difficult at all  Some recent data might be hidden    PHQ reviewed. Negative  Patient Active Problem List   Diagnosis Date Noted  . Type 2 diabetes with decreased circulation (HC[place]03/01/2019  . Vitamin B12 deficiency 08/08/2018  . Encounter for Medicare annual wellness exam 10/30/2017  . Patient is full code 10/30/2017  . Disorder of bursae of shoulder region 10/09/2017  . Coronary artery disease 10/09/2017  . Plantar fascial fibromatosis 10/09/2017  . Tendinitis of wrist 10/09/2017  . Schizoaffective disorder, bipolar type (HCMount Pleasant01/25/2019  . Chest pain 09/10/2017  . Pleuritic chest pain 05/27/2017  . Hypertriglyceridemia 12/21/2016  . Pansinusitis 11/15/2016  . 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 Breast cancer screening 09/28/2016  . Urine frequency 09/28/2016  . Preventative health care 09/28/2016  . Fatigue 05/01/2016  . Urinary tract infection 03/14/2016  . Adenomatous colon polyp 10/15/2015  . Airway hyperreactivity 10/15/2015  . Bipolar affective disorder (HCNick[place]02/06/2016  . CN (constipation) 10/15/2015  . CAFL (chronic airflow limitation) (HC[facility]02/06/2016  . Drug-induced hepatic  toxicity 10/15/2015  . Blood in feces 10/15/2015  . HLD (hyperlipidemia) 10/15/2015  . Hypoxemia 10/15/2015  . NASH (nonalcoholic steatohepatitis) 10/15/2015  . Morbid obesity ([place]) 10/15/2015  . Restless leg syndrome 10/15/2015  . Type 2 diabetes, uncontrolled, with neuropathy ([place]) 10/15/2015  . Elevated alkaline phosphatase level 09/22/2015  . Urinary bladder incontinence 09/22/2015  . Low back pain with left-sided sciatica 08/18/2015  . Encounter for monitoring tricyclic antidepressant therapy  05/13/2015  . Bipolar disorder ([place]) 05/11/2015  . Liver disease 05/11/2015  . Apnea, sleep 05/05/2015  . Heart valve disease 05/05/2015  . Chronic kidney disease, stage III (moderate) (HCC) 03/15/2015  . Migraine 03/12/2015  . COPD (chronic obstructive pulmonary disease) ([place]) 01/20/2015  . 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 GERD (gastroesophageal reflux disease) 01/20/2015  . Hypothyroid   . Vitamin D deficiency   . Urinary incontinence   . Liver lesion   . Hypomagnesemia   . Obstructive sleep apnea 10/20/2013  . Vaginitis 12/19/2011  . PERIPHERAL NEUROPATHY 03/04/2009  . HEMORRHOIDS, EXTERNAL 09/03/2008  . ALLERGIC RHINITIS 07/04/2007  . ASTHMA, PERSISTENT, MODERATE 07/04/2007  . INTERSTITIAL CYSTITIS 07/04/2007  . HYPERTENSION, BENIGN ESSENTIAL 06/19/2007    Past Medical History:  Diagnosis Date  . Anxiety   . Arthritis   . Asthma   . Bipolar depression ([place])   . CHF (congestive heart failure) ([place])   . Chronic headaches    migraines have lessened  . Chronic kidney disease    ddstage 111  . COPD (chronic obstructive pulmonary disease) ([place])   . Coronary artery disease   . Depression   . Diabetic neuropathy ([place])   . Dyspnea    uses inhalers for this  . Dysrhythmia    rapid  . Fatty liver   . GERD (gastroesophageal reflux disease)   . Headache   . Heart attack ([place]) 2009  . Hyperlipidemia   . Hypertension   . Hypomagnesemia   . Hypothyroid   . Irregular heart rhythm   . Liver lesion    Favored to be benign on CT; MRI of abd w/contract in Aug 2016. Liver Biopsy-Negative, March 2016  . Morbid obesity ([place])   . OSA (obstructive sleep apnea)   . Peripheral vascular disease ([place])   . Pinched nerve    Back  . Pneumonia   . RLS (restless legs syndrome)   . Seasonal allergies   . Sleep apnea    uses oxygen at night 2L d/t cpap did not fit  . Type 2 diabetes, uncontrolled, with neuropathy ([place]) 10/15/2015   Overview:  Microalbumin 7.7 12/2010:  Hgb A1c 10.2% 12/2010 and 9.7% 03/2011, eye exam 10/2009 [facility]   . Urinary incontinence   . Vitamin D deficiency     Past Surgical History:  Procedure Laterality Date  . ABDOMINAL HYSTERECTOMY    . ABDOMINAL HYSTERECTOMY     Partial  . bladder stim  2007  . BLADDER SURGERY     x 2. tacking of bladder and the other was the stimulator placement  . CHOLECYSTECTOMY    . INTERSTIM-GENERATOR CHANGE N/A 12/31/2017   Procedure: INTERSTIM-GENERATOR CHANGE;  Surgeon: Bjorn Loser, MD;  Location: [facility] ORS;  Service: Urology;  Laterality: N/A;  . LIVER BIOPSY  March 2016   Negative  . LUNG BIOPSY  2015   small spot on lung  . TUBAL LIGATION      Social History   Tobacco Use  . Smoking status: Never Smoker  . Smokeless  tobacco: Never Used  Substance Use Topics  . Alcohol use: No     Current Outpatient Medications:  .  albuterol (PROVENTIL HFA;VENTOLIN HFA) 108 (90 Base) MCG/ACT inhaler, Inhale 2 puffs into the lungs every 4 (four) hours as needed for wheezing or shortness of breath., Disp: 1 Inhaler, Rfl: 2 .  atorvastatin (LIPITOR) 80 MG tablet, Take 80 mg by mouth at bedtime. , Disp: , Rfl:  .  budesonide-formoterol (SYMBICORT) 160-4.5 MCG/ACT inhaler, Inhale 2 puffs into the lungs 2 (two) times daily., Disp: 1 Inhaler, Rfl: 11 .  busPIRone (BUSPAR) 10 MG tablet, Take 10 mg by mouth 2 (two) times daily., Disp: , Rfl:  .  clindamycin (CLEOCIN) 150 MG capsule, 2 tabs tid, Disp: 42 capsule, Rfl: 0 .  DULoxetine (CYMBALTA) 60 MG capsule, Take 120 mg by mouth daily., Disp: , Rfl:  .  empagliflozin (JARDIANCE) 25 MG TABS tablet, Take 25 mg by mouth daily., Disp: , Rfl:  .  ezetimibe (ZETIA) 10 MG tablet, Take 10 mg by mouth daily. , Disp: , Rfl:  .  fluticasone (FLONASE) 50 MCG/ACT nasal spray, Place 2 sprays into both nostrils daily., Disp: 16 g, Rfl: 6 .  gabapentin (NEURONTIN) 300 MG capsule, Take 1 capsule (300 mg total) by mouth 2 (two) times daily. Please schedule appointment  to receive further refills., Disp: 60 capsule, Rfl: 0 .  HUMULIN R U-500 KWIKPEN 500 UNIT/ML kwikpen, Inject 70 Units as directed 3 (three) times daily. , Disp: , Rfl:  .  insulin aspart (NOVOLOG) 100 UNIT/ML injection, Inject 5-25 Units into the skin 3 (three) times daily before meals. , Disp: , Rfl:  .  lamoTRIgine (LAMICTAL) 200 MG tablet, Take 200 mg by mouth 2 (two) times daily. , Disp: , Rfl:  .  levothyroxine (SYNTHROID) 50 MCG tablet, One by mouth daily three days a week, one and one-half pills four days a week (Mon, Wed, Fri, Sat for example) needs labs for further refills, Disp: 22 tablet, Rfl: 0 .  lisinopril (PRINIVIL,ZESTRIL) 2.5 MG tablet, Take 1 tablet (2.5 mg total) by mouth daily., Disp: 90 tablet, Rfl: 1 .  metoprolol tartrate (LOPRESSOR) 25 MG tablet, Take one tablet 25 mg in the am and 1/2 tab in the pm, Disp: 45 tablet, Rfl: 0 .  mupirocin ointment (BACTROBAN) 2 %, Place 1 application into the nose 2 (two) times daily., Disp: 22 g, Rfl: 0 .  Omega-3 Fatty Acids (FISH OIL PO), Take by mouth daily., Disp: , Rfl:  .  ondansetron (ZOFRAN) 4 MG tablet, Take 1 tablet (4 mg total) by mouth every 8 (eight) hours as needed for nausea or vomiting., Disp: 20 tablet, Rfl: 2 .  pantoprazole (PROTONIX) 40 MG tablet, Take 1 tablet (40 mg total) by mouth daily., Disp: 90 tablet, Rfl: 3 .  QUEtiapine (SEROQUEL) 400 MG tablet, Take 400 mg by mouth at bedtime., Disp: , Rfl:  .  TRULICITY 1.5 RW/4.3XV SOPN, Inject 1.5 mLs as directed once a week. , Disp: , Rfl:  .  hydrOXYzine (VISTARIL) 50 MG capsule, Take 50-100 mg by mouth daily as needed for anxiety. , Disp: , Rfl:  .  Na Sulfate-K Sulfate-Mg Sulf (SUPREP BOWEL PREP KIT) 17.5-3.13-1.6 GM/177ML SOLN, Take 1 kit by mouth as directed. (Patient not taking: Reported on 02/20/2019), Disp: 1 Bottle, Rfl: 0 .  oxyCODONE (ROXICODONE) 5 MG immediate release tablet, Take 1 tablet (5 mg total) by mouth every 8 (eight) hours as needed. (Patient not taking:  Reported on 02/12/2019),  Disp: 15 tablet, Rfl: 0 .  traZODone (DESYREL) 100 MG tablet, Take 100 mg by mouth at bedtime., Disp: , Rfl:  .  ULTICARE SHORT PEN NEEDLES 31G X 8 MM MISC, , Disp: , Rfl:   Allergies  Allergen Reactions  . Mirtazapine Anaphylaxis    REACTION: closes throat  . Morphine And Related     anaphylaxis  . Sulfamethoxazole-Trimethoprim Rash    REACTION: [place] all over  . Amoxicillin Er Nausea And Vomiting    Has patient had a PCN reaction causing immediate rash, facial/tongue/throat swelling, SOB or lightheadedness with hypotension: No Has patient had a PCN reaction causing severe rash involving mucus membranes or skin necrosis: No Has patient had a PCN reaction that required hospitalization: No Has patient had a PCN reaction occurring within the last 10 years: Yes If all of the above answers are "NO", then may proceed with Cephalosporin use.  . Codeine Itching    REACTION: itch, rash  . Hydrocodone-Acetaminophen Rash    REACTION: rash  . Keflex [Cephalexin] Rash  . Prasterone Nausea And Vomiting    ROS   No other specific complaints in a complete review of systems (except as listed in HPI above).  Objective  There were no vitals filed for this visit.   There is no height or weight on file to calculate BMI.  Patient is alert, able to speak in full sentences without difficulty.   Assessment & Plan  1. HYPERTENSION, BENIGN ESSENTIAL - metoprolol tartrate (LOPRESSOR) 25 MG tablet; Take one tablet 25 mg in the am and 1/2 tab in the pm  Dispense: 135 tablet; Refill: 0  2. Chronic obstructive pulmonary disease, unspecified COPD type ([place]) Stable   3. Other specified hypothyroidism Will give 2 week supply for her to get her bubble pack meds and will come in tomorrow for lab check  - levothyroxine (SYNTHROID) 50 MCG tablet; 1 TAB daily three days a week,  1.5 TABS four days a week (Mon, Wed, Fri, Sat for example)  Dispense: 22 tablet; Refill: 0  4. Type 2  diabetes, uncontrolled, with neuropathy ([place]) Follow up with endo - gabapentin (NEURONTIN) 300 MG capsule; Take 1 capsule (300 mg total) by mouth 2 (two) times daily.  Dispense: 180 capsule; Refill: 0    I discussed the assessment and treatment plan with the patient. The patient was provided an opportunity to ask questions and all were answered. The patient agreed with the plan and demonstrated an understanding of the instructions.   The patient was advised to call back or seek an in-person evaluation if the symptoms worsen or if the condition fails to improve as anticipated.  I provided 21 minutes of non-face-to-face time during this encounter.   Fredderick Severance, NP

## 2019-02-21 NOTE — Telephone Encounter (Signed)
Message sent to scheduling.  Medication has already been sent in by PCP

## 2019-02-21 NOTE — Telephone Encounter (Signed)
Pt hasn't been seen in over a year. Pt needs f/u appointment for refills.

## 2019-02-23 NOTE — Progress Notes (Deleted)
02/24/2019 7:21 PM   [name] 1965-10-27 [id number]  Referring provider: Arnetha Courser, [place],  [place] [address]  No chief complaint on file.   HPI: Patient is a 53 -year-old female who presents today as a referral from their PCP, [name], for positive urine dips for blood.      Patient was found to have *** hematuria on *** with *** RBC's/hpf.  Patient *** does/doesn't have a prior history of microscopic hematuria.    She/He does/ does not have a prior history of recurrent urinary tract infections, nephrolithiasis, trauma to the genitourinary tract, BPH or malignancies of the genitourinary tract. ***  She/He does/does not have a family medical history of nephrolithiasis, malignancies of the genitourinary tract or hematuria. ***  Today, she/he are having/not having symptoms of frequent urination, urgency, dysuria, nocturia, incontinence, hesitancy, intermittency, straining to urinate or a weak urinary stream.  Her/His UA today demonstrates ***.  Today, she/he are having/not having symptoms of frequent urination, urgency, dysuria, nocturia, incontinence, hesitancy, intermittency, straining to urinate or a weak urinary stream.  Her/His UA today demonstrates ***.  Patient denies any gross hematuria, dysuria or suprapubic/flank pain.  Patient denies any fevers, chills, nausea or vomiting.  ***  She/He have/have not had any recent imaging studies. ***  He/She are/are not a smoker. She/He is a former smoker, with a*** ppd history.  Quit *** years ago.  They are/are not exposed to secondhand smoke.  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   ***  He/She has HTN. ***   He/She has a high BMI.     PMH: Past Medical History:  Diagnosis Date  . Anxiety   . Arthritis   . Asthma   . Bipolar depression ([place])   . CHF (congestive heart failure) ([place])   . Chronic headaches    migraines have lessened  . Chronic  kidney disease    ddstage 111  . COPD (chronic obstructive pulmonary disease) ([place])   . Coronary artery disease   . Depression   . Diabetic neuropathy ([place])   . Dyspnea    uses inhalers for this  . Dysrhythmia    rapid  . Fatty liver   . GERD (gastroesophageal reflux disease)   . Headache   . Heart attack ([place]) 2009  . Hyperlipidemia   . Hypertension   . Hypomagnesemia   . Hypothyroid   . Irregular heart rhythm   . Liver lesion    Favored to be benign on CT; MRI of abd w/contract in Aug 2016. Liver Biopsy-Negative, March 2016  . Morbid obesity ([place])   . OSA (obstructive sleep apnea)   . Peripheral vascular disease ([place])   . Pinched nerve    Back  . Pneumonia   . RLS (restless legs syndrome)   . Seasonal allergies   . Sleep apnea    uses oxygen at night 2L d/t cpap did not fit  . Type 2 diabetes, uncontrolled, with neuropathy ([place]) 10/15/2015   Overview:  Microalbumin 7.7 12/2010: Hgb A1c 10.2% 12/2010 and 9.7% 03/2011, eye exam 10/2009 [facility]   . Urinary incontinence   . Vitamin D deficiency     Surgical History: Past Surgical History:  Procedure Laterality Date  . ABDOMINAL HYSTERECTOMY    . ABDOMINAL HYSTERECTOMY     Partial  . bladder stim  2007  . BLADDER SURGERY     x 2. tacking of bladder and the  other was the stimulator placement  . CHOLECYSTECTOMY    . INTERSTIM-GENERATOR CHANGE N/A 12/31/2017   Procedure: INTERSTIM-GENERATOR CHANGE;  Surgeon: Bjorn Loser, MD;  Location: [facility] ORS;  Service: Urology;  Laterality: N/A;  . LIVER BIOPSY  March 2016   Negative  . LUNG BIOPSY  2015   small spot on lung  . TUBAL LIGATION      Home Medications:  Allergies as of 02/24/2019      Reactions   Mirtazapine Anaphylaxis   REACTION: closes throat   Morphine And Related    anaphylaxis   Sulfamethoxazole-trimethoprim Rash   REACTION: Whelps all over   Amoxicillin Er Nausea And Vomiting   Has patient had a PCN reaction causing immediate rash,  facial/tongue/throat swelling, SOB or lightheadedness with hypotension: No Has patient had a PCN reaction causing severe rash involving mucus membranes or skin necrosis: No Has patient had a PCN reaction that required hospitalization: No Has patient had a PCN reaction occurring within the last 10 years: Yes If all of the above answers are "NO", then may proceed with Cephalosporin use.   Codeine Itching   REACTION: itch, rash   Hydrocodone-acetaminophen Rash   REACTION: rash   Keflex [cephalexin] Rash   Prasterone Nausea And Vomiting      Medication List       Accurate as of February 23, 2019  7:21 PM. If you have any questions, ask your nurse or doctor.        albuterol 108 (90 Base) MCG/ACT inhaler Commonly known as: VENTOLIN HFA Inhale 2 puffs into the lungs every 4 (four) hours as needed for wheezing or shortness of breath.   atorvastatin 80 MG tablet Commonly known as: LIPITOR Take 80 mg by mouth at bedtime.   budesonide-formoterol 160-4.5 MCG/ACT inhaler Commonly known as: SYMBICORT Inhale 2 puffs into the lungs 2 (two) times daily.   busPIRone 10 MG tablet Commonly known as: BUSPAR Take 10 mg by mouth 2 (two) times daily.   clindamycin 150 MG capsule Commonly known as: Cleocin 2 tabs tid   DULoxetine 60 MG capsule Commonly known as: CYMBALTA Take 120 mg by mouth daily.   empagliflozin 25 MG Tabs tablet Commonly known as: JARDIANCE Take 25 mg by mouth daily.   ezetimibe 10 MG tablet Commonly known as: ZETIA Take 10 mg by mouth daily.   FISH OIL PO Take by mouth daily.   fluticasone 50 MCG/ACT nasal spray Commonly known as: FLONASE Place 2 sprays into both nostrils daily.   gabapentin 300 MG capsule Commonly known as: Neurontin Take 1 capsule (300 mg total) by mouth 2 (two) times daily.   HumuLIN R U-500 KwikPen 500 UNIT/ML kwikpen Generic drug: insulin regular human CONCENTRATED Inject 70 Units as directed 3 (three) times daily.   hydrOXYzine 50 MG  capsule Commonly known as: VISTARIL Take 50-100 mg by mouth daily as needed for anxiety.   insulin aspart 100 UNIT/ML injection Commonly known as: novoLOG Inject 5-25 Units into the skin 3 (three) times daily before meals.   lamoTRIgine 200 MG tablet Commonly known as: LAMICTAL Take 200 mg by mouth 2 (two) times daily.   levothyroxine 50 MCG tablet Commonly known as: SYNTHROID 1 TAB daily three days a week,  1.5 TABS four days a week (Mon, Wed, Fri, Sat for example)   lisinopril 2.5 MG tablet Commonly known as: ZESTRIL Take 1 tablet (2.5 mg total) by mouth daily.   metoprolol tartrate 25 MG tablet Commonly known as: LOPRESSOR Take one  tablet 25 mg in the am and 1/2 tab in the pm   mupirocin ointment 2 % Commonly known as: Bactroban Place 1 application into the nose 2 (two) times daily.   Na Sulfate-K Sulfate-Mg Sulf 17.5-3.13-1.6 GM/177ML Soln Commonly known as: Suprep Bowel Prep Kit Take 1 kit by mouth as directed.   ondansetron 4 MG tablet Commonly known as: ZOFRAN Take 1 tablet (4 mg total) by mouth every 8 (eight) hours as needed for nausea or vomiting.   pantoprazole 40 MG tablet Commonly known as: PROTONIX Take 1 tablet (40 mg total) by mouth daily.   QUEtiapine 400 MG tablet Commonly known as: SEROQUEL Take 400 mg by mouth at bedtime.   traZODone 100 MG tablet Commonly known as: DESYREL Take 100 mg by mouth at bedtime.   Trulicity 1.5 TZ/0.0FV Sopn Generic drug: Dulaglutide Inject 1.5 mLs as directed once a week.   UltiCare Short Pen Needles 31G X 8 MM Misc Generic drug: Insulin Pen Needle       Allergies:  Allergies  Allergen Reactions  . Mirtazapine Anaphylaxis    REACTION: closes throat  . Morphine And Related     anaphylaxis  . Sulfamethoxazole-Trimethoprim Rash    REACTION: Whelps all over  . Amoxicillin Er Nausea And Vomiting    Has patient had a PCN reaction causing immediate rash, facial/tongue/throat swelling, SOB or lightheadedness  with hypotension: No Has patient had a PCN reaction causing severe rash involving mucus membranes or skin necrosis: No Has patient had a PCN reaction that required hospitalization: No Has patient had a PCN reaction occurring within the last 10 years: Yes If all of the above answers are "NO", then may proceed with Cephalosporin use.  . Codeine Itching    REACTION: itch, rash  . Hydrocodone-Acetaminophen Rash    REACTION: rash  . Keflex [Cephalexin] Rash  . Prasterone Nausea And Vomiting    Family History: Family History  Problem Relation Age of Onset  . Diabetes Mother   . Heart disease Mother   . Hyperlipidemia Mother   . Hypertension Mother   . Kidney disease Mother   . Mental illness Mother   . Hypothyroidism Mother   . Stroke Mother   . Osteoporosis Mother   . Glaucoma Mother   . Congestive Heart Failure Mother   . Hypertension Father   . Asthma Daughter   . Cancer Daughter        throat  . Bipolar disorder Daughter   . Heart disease Maternal Grandfather   . Rheum arthritis Maternal Grandfather   . Cancer Maternal Grandfather        liver  . Kidney disease Maternal Grandfather   . Cancer Paternal Grandmother        lung  . Kidney disease Brother   . Breast cancer Maternal Grandmother 60  . Bipolar disorder Daughter   . Hypertension Daughter   . Restless legs syndrome Daughter     Social History:  reports that she has never smoked. She has never used smokeless tobacco. She reports that she does not drink alcohol or use drugs.  ROS:                                        Physical Exam: There were no vitals taken for this visit.  Constitutional:  Well nourished. Alert and oriented, No acute distress. HEENT: [place] AT, moist mucus membranes.  Trachea midline, no masses. Cardiovascular: No clubbing, cyanosis, or edema. Respiratory: Normal respiratory effort, no increased work of breathing. GI: Abdomen is soft, non tender, non distended, no  abdominal masses. Liver and spleen not palpable.  No hernias appreciated.  Stool sample for occult testing is not indicated.   GU: No CVA tenderness.  No bladder fullness or masses.  Patient with circumcised/uncircumcised phallus. ***Foreskin easily retracted***  Urethral meatus is patent.  No penile discharge. No penile lesions or rashes. Scrotum without lesions, cysts, rashes and/or edema.  Testicles are located scrotally bilaterally. No masses are appreciated in the testicles. Left and right epididymis are normal. Normal external genitalia, normal pubic hair distribution, no lesions.  Normal urethral meatus, no lesions, no prolapse, no discharge.   No urethral masses, tenderness and/or tenderness. No bladder fullness, tenderness or masses. Normal vagina mucosa, good estrogen effect, no discharge, no lesions, good pelvic support, no cystocele or rectocele noted.  No cervical motion tenderness.  Uterus is freely mobile and non-fixed.  No adnexal/parametria masses or tenderness noted.  Anus and perineum are without rashes or lesions.   ***  Rectal: Patient with  normal sphincter tone. Anus and perineum without scarring or rashes. No rectal masses are appreciated. Prostate is approximately *** grams, *** nodules are appreciated. Seminal vesicles are normal. Skin: No rashes, bruises or suspicious lesions. Lymph: No cervical or inguinal adenopathy. Neurologic: Grossly intact, no focal deficits, moving all 4 extremities. Psychiatric: Normal mood and affect.  Laboratory Data: Lab Results  Component Value Date   WBC 8.4 01/03/2019   HGB 12.1 01/03/2019   HCT 37.5 01/03/2019   MCV 89.1 01/03/2019   PLT 282 01/03/2019    Lab Results  Component Value Date   CREATININE 1.33 (H) 01/03/2019    No results found for: PSA  No results found for: TESTOSTERONE  Lab Results  Component Value Date   HGBA1C 8.3 08/26/2018    Lab Results  Component Value Date   TSH 0.36 (L) 10/04/2018       Component  Value Date/Time   CHOL 196 09/28/2016 1456   CHOL 196 09/22/2015 1339   CHOL 141 10/25/2013 0314   HDL 33 (L) 09/28/2016 1456   HDL 35 (L) 09/22/2015 1339   HDL 9 (L) 10/25/2013 0314   CHOLHDL 5.9 (H) 09/28/2016 1456   VLDL NOT CALC 09/28/2016 1456   VLDL SEE COMMENT 10/25/2013 0314   LDLCALC NOT CALC 09/28/2016 1456   LDLCALC 99 09/22/2015 1339   LDLCALC SEE COMMENT 10/25/2013 0314    Lab Results  Component Value Date   AST 39 (H) 10/04/2018   Lab Results  Component Value Date   ALT 44 (H) 10/04/2018   No components found for: ALKALINEPHOPHATASE No components found for: BILIRUBINTOTAL  No results found for: ESTRADIOL   Urinalysis    Component Value Date/Time   COLORURINE YELLOW (A) 01/03/2019 1525   APPEARANCEUR CLEAR (A) 01/03/2019 1525   APPEARANCEUR Clear 04/15/2018 1433   LABSPEC 1.030 01/03/2019 1525   LABSPEC 1.026 10/09/2014 1808   PHURINE 5.0 01/03/2019 1525   GLUCOSEU >=500 (A) 01/03/2019 1525   GLUCOSEU >=500 10/09/2014 1808   HGBUR NEGATIVE 01/03/2019 1525   HGBUR moderate 01/04/2009 0913   BILIRUBINUR NEGATIVE 01/03/2019 1525   BILIRUBINUR neg 11/12/2018 1046   BILIRUBINUR Negative 04/15/2018 1433   BILIRUBINUR Negative 10/09/2014 1808   KETONESUR NEGATIVE 01/03/2019 1525   PROTEINUR NEGATIVE 01/03/2019 1525   UROBILINOGEN 0.2 11/12/2018 1046   UROBILINOGEN 0.2 01/04/2009 0913  NITRITE NEGATIVE 01/03/2019 [place] 01/03/2019 1525   LEUKOCYTESUR Negative 10/09/2014 1808    Pertinent Imaging: ***  Assessment & Plan:  ***  1. *** hematuria Explained to the patient that there are a number of causes that can be associated with blood in the urine, such as stones, *** BPH, UTI's, damage to the urinary tract and/or cancer. At this time, I felt that the patient warranted further urologic evaluation.   The AUA guidelines state that a CT urogram is the preferred imaging study to evaluate hematuria. I explained to the patient that a  contrast material will be injected into a vein and that in rare instances, an allergic reaction can result and may even life threatening   The patient denies any allergies to contrast***, iodine and/or seafood*** and is not taking metformin.*** Her reproductive status is hysterectomy, postmenopausal, tubal ligation are unknown at this time.  We will obtain a serum pregnancy test today. ***  Explained to the patient that the imaging studies performed on ***are not the recommended studies by the AUA for the work-up of blood in the urine.    The imaging studies that were performed lack the detail of excluding some urological tumors. The recommended study is a CT urogram.  This study does require the use of contrast material.    At this time, he/she may choose to forego the appropriate study with the understanding that they are risking a missed diagnosis of a urological cancer and proceed with in office cystoscopy Explained to the patient that almost half of all renal cell carcinoma is found incidentally and not to wait on the classic triad of flank pain, palpable abdominal mass and gross hematuria that is generally only seen in more advanced disease.  There are currently 64,000 new cases in the [place] and 14,000 deaths in the [place] every year.   They may also choose to undergo the appropriate study (CTU) or choose to undergo a cystoscopy with bilateral RTG's to complete the hematuria work up. ***  On occasion, we may need to resort to a non-contrast study such as a renal ultrasound or a non-contrast CT if the patient has a contrast allergy or renal insufficiency.  In the latter case,  I told him/her *** that an upper tract study without contrast will lack the detail of excluding some urologic tumors.  Because of this, he/she would need to undergo cystoscopy with bilateral retrogrades in the OR to complete the hematuria workup in addition to the imaging studies. ***  Following the imaging study,  I've recommended a  cystoscopy. I described how this is performed, typically in an office setting with a flexible cystoscope. We described the risks, benefits, and possible side effects, the most common of which is a minor amount of blood in the urine and/or burning which usually resolves in 24 to 48 hours.  *** There is a 20% risk with gross hematuria and a 2% to 5% risk with microscopic hematuria of missing a bladder cancer.  Our goal is to identify the cancer in its early stage so as to give you the greater change of survival and a cure.   *** The patient had the opportunity to ask questions which were answered. Based upon this discussion, the patient is willing to proceed. Therefore, I've ordered: a CT Urogram and cystoscopy. ***  - The patient will return following all of the above for discussion of the results.   - UA ***  - Urine culture ***  -  BUN + creatinine  ***  There are no diagnoses linked to this encounter.  No follow-ups on file.  These notes generated with voice recognition software. I apologize for typographical errors.  Zara Council, PA-C  [facility] [address] [place]  [place], [place] [address] [phone]

## 2019-02-24 ENCOUNTER — Ambulatory Visit: Payer: Medicare HMO | Admitting: Urology

## 2019-02-24 ENCOUNTER — Other Ambulatory Visit: Payer: Self-pay

## 2019-02-24 DIAGNOSIS — E038 Other specified hypothyroidism: Secondary | ICD-10-CM

## 2019-02-25 ENCOUNTER — Other Ambulatory Visit: Payer: Self-pay | Admitting: Nurse Practitioner

## 2019-02-25 ENCOUNTER — Telehealth: Payer: Self-pay | Admitting: Cardiovascular Disease

## 2019-02-25 DIAGNOSIS — E038 Other specified hypothyroidism: Secondary | ICD-10-CM

## 2019-02-25 LAB — TSH: TSH: 1.17 mIU/L

## 2019-02-25 MED ORDER — LEVOTHYROXINE SODIUM 50 MCG PO TABS: ORAL_TABLET | ORAL | 1 refills | Status: DC

## 2019-02-25 NOTE — Telephone Encounter (Signed)
Virtual Visit Pre-Appointment Phone Call  "(Name), I am calling you today to discuss your upcoming appointment. We are currently trying to limit exposure to the virus that causes COVID-19 by seeing patients at home rather than in the office."  1. "What is the BEST phone number to call the day of the visit?" - include this in appointment notes  2. Do you have or have access to (through a family member/friend) a smartphone with video capability that we can use for your visit?" a. If yes - list this number in appt notes as cell (if different from BEST phone #) and list the appointment type as a VIDEO visit in appointment notes b. If no - list the appointment type as a PHONE visit in appointment notes  3. Confirm consent - "In the setting of the current Covid19 crisis, you are scheduled for a (phone or video) visit with your provider on (date) at (time).  Just as we do with many in-office visits, in order for you to participate in this visit, we must obtain consent.  If you'd like, I can send this to your mychart (if signed up) or email for you to review.  Otherwise, I can obtain your verbal consent now.  All virtual visits are billed to your insurance company just like a normal visit would be.  By agreeing to a virtual visit, we'd like you to understand that the technology does not allow for your provider to perform an examination, and thus may limit your provider's ability to fully assess your condition. If your provider identifies any concerns that need to be evaluated in person, we will make arrangements to do so.  Finally, though the technology is pretty good, we cannot assure that it will always work on either your or our end, and in the setting of a video visit, we may have to convert it to a phone-only visit.  In either situation, we cannot ensure that we have a secure connection.  Are you willing to proceed?" STAFF: Did the patient verbally acknowledge consent to telehealth visit? Document  YES/NO here: yes  4. Advise patient to be prepared - "Two hours prior to your appointment, go ahead and check your blood pressure, pulse, oxygen saturation, and your weight (if you have the equipment to check those) and write them all down. When your visit starts, your provider will ask you for this information. If you have an Apple Watch or Kardia device, please plan to have heart rate information ready on the day of your appointment. Please have a pen and paper handy nearby the day of the visit as well."  5. Give patient instructions for MyChart download to smartphone OR Doximity/Doxy.me as below if video visit (depending on what platform provider is using)  6. Inform patient they will receive a phone call 15 minutes prior to their appointment time (may be from unknown caller ID) so they should be prepared to answer    TELEPHONE CALL NOTE  [name] has been deemed a candidate for a follow-up tele-health visit to limit community exposure during the Covid-19 pandemic. I spoke with the patient via phone to ensure availability of phone/video source, confirm preferred email & phone number, and discuss instructions and expectations.  I reminded [name] to be prepared with any vital sign and/or heart rhythm information that could potentially be obtained via home monitoring, at the time of her visit. I reminded [name] to expect a phone call prior to  her visit.  Clarisse Gouge 02/25/2019 10:13 AM   INSTRUCTIONS FOR DOWNLOADING THE MYCHART APP TO SMARTPHONE  - The patient must first make sure to have activated MyChart and know their login information - If Apple, go to [employer] and type in MyChart in the search bar and download the app. If Android, ask patient to go to [employer] and type in [place] in the search bar and download the app. The app is free but as with any other app downloads, their phone may require them to verify saved payment information or Apple/Android  password.  - The patient will need to then log into the app with their MyChart username and password, and select [facility] as their healthcare provider to link the account. When it is time for your visit, go to the MyChart app, find appointments, and click Begin Video Visit. Be sure to Select Allow for your device to access the Microphone and Camera for your visit. You will then be connected, and your provider will be with you shortly.  **If they have any issues connecting, or need assistance please contact MyChart service desk [phone])**  **If using a computer, in order to ensure the best quality for their visit they will need to use either of the following Internet Browsers: [employer], or Google Chrome**  IF USING DOXIMITY or DOXY.ME - The patient will receive a link just prior to their visit by text.     FULL LENGTH CONSENT FOR TELE-HEALTH VISIT   I hereby voluntarily request, consent and authorize [facility] and its employed or contracted physicians, physician assistants, nurse practitioners or other licensed health care professionals (the Practitioner), to provide me with telemedicine health care services (the Services") as deemed necessary by the treating Practitioner. I acknowledge and consent to receive the Services by the Practitioner via telemedicine. I understand that the telemedicine visit will involve communicating with the Practitioner through live audiovisual communication technology and the disclosure of certain medical information by electronic transmission. I acknowledge that I have been given the opportunity to request an in-person assessment or other available alternative prior to the telemedicine visit and am voluntarily participating in the telemedicine visit.  I understand that I have the right to withhold or withdraw my consent to the use of telemedicine in the course of my care at any time, without affecting my right to future care or treatment,  and that the Practitioner or I may terminate the telemedicine visit at any time. I understand that I have the right to inspect all information obtained and/or recorded in the course of the telemedicine visit and may receive copies of available information for a reasonable fee.  I understand that some of the potential risks of receiving the Services via telemedicine include:   Delay or interruption in medical evaluation due to technological equipment failure or disruption;  Information transmitted may not be sufficient (e.g. poor resolution of images) to allow for appropriate medical decision making by the Practitioner; and/or   In rare instances, security protocols could fail, causing a breach of personal health information.  Furthermore, I acknowledge that it is my responsibility to provide information about my medical history, conditions and care that is complete and accurate to the best of my ability. I acknowledge that Practitioner's advice, recommendations, and/or decision may be based on factors not within their control, such as incomplete or inaccurate data provided by me or distortions of diagnostic images or specimens that may result from electronic transmissions. I  understand that the practice of medicine is not an exact science and that Practitioner makes no warranties or guarantees regarding treatment outcomes. I acknowledge that I will receive a copy of this consent concurrently upon execution via email to the email address I last provided but may also request a printed copy by calling the office of [facility].    I understand that my insurance will be billed for this visit.   I have read or had this consent read to me.  I understand the contents of this consent, which adequately explains the benefits and risks of the Services being provided via telemedicine.   I have been provided ample opportunity to ask questions regarding this consent and the Services and have had my questions  answered to my satisfaction.  I give my informed consent for the services to be provided through the use of telemedicine in my medical care  By participating in this telemedicine visit I agree to the above.

## 2019-02-25 NOTE — Telephone Encounter (Signed)
Scheduled .  See other note. Closing encounter

## 2019-02-26 ENCOUNTER — Telehealth: Payer: Self-pay | Admitting: Family Medicine

## 2019-02-26 NOTE — Telephone Encounter (Signed)
Patient called, left VM to return the call to the office for lab results.   Result Notes for TSH  Notes recorded by Sibyl Parr, CMA on 02/26/2019 at 10:50 AM EDT  Left detailed VM. CRM created.  ------   Notes recorded by Fredderick Severance, NP on 02/25/2019 at 1:18 PM EDT  Normal TSH; sending 90 day supply of thyroid medicine

## 2019-02-26 NOTE — Telephone Encounter (Signed)
Copied from [place] [id number]. Topic: Quick Communication - Lab Results (Clinic Use ONLY) >> Feb 26, 2019 10:50 AM Sibyl Parr, CMA wrote: Called patient to inform them of  lab results. When patient returns call, triage nurse may disclose results.   Pt did not want to wait, is asking for a call back   [phone]

## 2019-02-26 NOTE — Telephone Encounter (Signed)
Pt returned call for lab results. Pt requests call back.

## 2019-02-27 ENCOUNTER — Ambulatory Visit: Payer: Self-pay

## 2019-02-27 NOTE — Telephone Encounter (Signed)
Incoming call from pt.  Requesting lab results.  Related notes of Suezanne Cheshire.  Pt.  Voiced understanding.

## 2019-02-28 ENCOUNTER — Other Ambulatory Visit
Admission: RE | Admit: 2019-02-28 | Discharge: 2019-02-28 | Disposition: A | Discharge status: Home / Self Care | Payer: Medicare HMO | Source: Ambulatory Visit | Attending: Gastroenterology | Admitting: Gastroenterology

## 2019-02-28 ENCOUNTER — Other Ambulatory Visit: Payer: Self-pay

## 2019-02-28 DIAGNOSIS — Z1159 Encounter for screening for other viral diseases: Secondary | ICD-10-CM | POA: Insufficient documentation

## 2019-03-01 LAB — NOVEL CORONAVIRUS, NAA (HOSP ORDER, SEND-OUT TO REF LAB; TAT 18-24 HRS): SARS-CoV-2, NAA: NOT DETECTED

## 2019-03-03 ENCOUNTER — Encounter: Payer: Self-pay | Admitting: Anesthesiology

## 2019-03-04 ENCOUNTER — Ambulatory Visit
Admission: RE | Admit: 2019-03-04 | Discharge: 2019-03-04 | Disposition: A | Discharge status: Home / Self Care | Payer: Medicare HMO | Attending: Gastroenterology | Admitting: Gastroenterology

## 2019-03-04 ENCOUNTER — Ambulatory Visit: Payer: Medicare HMO | Admitting: Anesthesiology

## 2019-03-04 ENCOUNTER — Encounter
Admission: RE | Disposition: A | Discharge status: Home / Self Care | Payer: Self-pay | Source: Home / Self Care | Attending: Gastroenterology

## 2019-03-04 ENCOUNTER — Other Ambulatory Visit: Payer: Self-pay

## 2019-03-04 ENCOUNTER — Encounter: Payer: Self-pay | Admitting: Certified Registered Nurse Anesthetist

## 2019-03-04 DIAGNOSIS — I252 Old myocardial infarction: Secondary | ICD-10-CM | POA: Insufficient documentation

## 2019-03-04 DIAGNOSIS — N189 Chronic kidney disease, unspecified: Secondary | ICD-10-CM | POA: Insufficient documentation

## 2019-03-04 DIAGNOSIS — I2510 Atherosclerotic heart disease of native coronary artery without angina pectoris: Secondary | ICD-10-CM | POA: Diagnosis not present

## 2019-03-04 DIAGNOSIS — E1122 Type 2 diabetes mellitus with diabetic chronic kidney disease: Secondary | ICD-10-CM | POA: Diagnosis not present

## 2019-03-04 DIAGNOSIS — K641 Second degree hemorrhoids: Secondary | ICD-10-CM | POA: Insufficient documentation

## 2019-03-04 DIAGNOSIS — E785 Hyperlipidemia, unspecified: Secondary | ICD-10-CM | POA: Diagnosis not present

## 2019-03-04 DIAGNOSIS — E039 Hypothyroidism, unspecified: Secondary | ICD-10-CM | POA: Diagnosis not present

## 2019-03-04 DIAGNOSIS — J449 Chronic obstructive pulmonary disease, unspecified: Secondary | ICD-10-CM | POA: Insufficient documentation

## 2019-03-04 DIAGNOSIS — K219 Gastro-esophageal reflux disease without esophagitis: Secondary | ICD-10-CM | POA: Diagnosis not present

## 2019-03-04 DIAGNOSIS — D500 Iron deficiency anemia secondary to blood loss (chronic): Secondary | ICD-10-CM

## 2019-03-04 DIAGNOSIS — K2950 Unspecified chronic gastritis without bleeding: Secondary | ICD-10-CM | POA: Insufficient documentation

## 2019-03-04 DIAGNOSIS — D122 Benign neoplasm of ascending colon: Secondary | ICD-10-CM | POA: Diagnosis not present

## 2019-03-04 DIAGNOSIS — G4733 Obstructive sleep apnea (adult) (pediatric): Secondary | ICD-10-CM | POA: Insufficient documentation

## 2019-03-04 DIAGNOSIS — F419 Anxiety disorder, unspecified: Secondary | ICD-10-CM | POA: Insufficient documentation

## 2019-03-04 DIAGNOSIS — Z794 Long term (current) use of insulin: Secondary | ICD-10-CM | POA: Diagnosis not present

## 2019-03-04 DIAGNOSIS — K254 Chronic or unspecified gastric ulcer with hemorrhage: Secondary | ICD-10-CM

## 2019-03-04 DIAGNOSIS — F319 Bipolar disorder, unspecified: Secondary | ICD-10-CM | POA: Insufficient documentation

## 2019-03-04 DIAGNOSIS — Z7951 Long term (current) use of inhaled steroids: Secondary | ICD-10-CM | POA: Insufficient documentation

## 2019-03-04 DIAGNOSIS — Z79899 Other long term (current) drug therapy: Secondary | ICD-10-CM | POA: Diagnosis not present

## 2019-03-04 DIAGNOSIS — Z6837 Body mass index (BMI) 37.0-37.9, adult: Secondary | ICD-10-CM | POA: Insufficient documentation

## 2019-03-04 DIAGNOSIS — E1151 Type 2 diabetes mellitus with diabetic peripheral angiopathy without gangrene: Secondary | ICD-10-CM | POA: Diagnosis not present

## 2019-03-04 DIAGNOSIS — D123 Benign neoplasm of transverse colon: Secondary | ICD-10-CM | POA: Insufficient documentation

## 2019-03-04 DIAGNOSIS — D509 Iron deficiency anemia, unspecified: Secondary | ICD-10-CM | POA: Insufficient documentation

## 2019-03-04 DIAGNOSIS — I509 Heart failure, unspecified: Secondary | ICD-10-CM | POA: Insufficient documentation

## 2019-03-04 DIAGNOSIS — I130 Hypertensive heart and chronic kidney disease with heart failure and stage 1 through stage 4 chronic kidney disease, or unspecified chronic kidney disease: Secondary | ICD-10-CM | POA: Diagnosis not present

## 2019-03-04 DIAGNOSIS — K635 Polyp of colon: Secondary | ICD-10-CM

## 2019-03-04 HISTORY — PX: COLONOSCOPY WITH PROPOFOL: SHX5780

## 2019-03-04 HISTORY — PX: ESOPHAGOGASTRODUODENOSCOPY (EGD) WITH PROPOFOL: SHX5813

## 2019-03-04 LAB — GLUCOSE, CAPILLARY: Glucose-Capillary: 174 mg/dL — ABNORMAL HIGH (ref 70–99)

## 2019-03-04 SURGERY — COLONOSCOPY WITH PROPOFOL
Anesthesia: General

## 2019-03-04 MED ORDER — PROPOFOL 500 MG/50ML IV EMUL
INTRAVENOUS | Status: AC
  Filled 2019-03-04: qty 50

## 2019-03-04 MED ORDER — LIDOCAINE HCL (PF) 2 % IJ SOLN
INTRAMUSCULAR | Status: AC
  Filled 2019-03-04: qty 10

## 2019-03-04 MED ORDER — PROPOFOL 10 MG/ML IV BOLUS
INTRAVENOUS | Status: DC | PRN
  Administered 2019-03-04: 25 mg via INTRAVENOUS
  Administered 2019-03-04: 80 mg via INTRAVENOUS

## 2019-03-04 MED ORDER — SODIUM CHLORIDE 0.9 % IV SOLN
INTRAVENOUS | Status: DC
  Administered 2019-03-04: 09:00:00 via INTRAVENOUS

## 2019-03-04 MED ORDER — MIDAZOLAM HCL 2 MG/2ML IJ SOLN
INTRAMUSCULAR | Status: AC
  Filled 2019-03-04: qty 2

## 2019-03-04 MED ORDER — LIDOCAINE HCL (CARDIAC) PF 100 MG/5ML IV SOSY
PREFILLED_SYRINGE | INTRAVENOUS | Status: DC | PRN
  Administered 2019-03-04: 50 mg via INTRAVENOUS

## 2019-03-04 MED ORDER — MIDAZOLAM HCL 2 MG/2ML IJ SOLN
INTRAMUSCULAR | Status: DC | PRN
  Administered 2019-03-04: 2 mg via INTRAVENOUS

## 2019-03-04 MED ORDER — PROPOFOL 500 MG/50ML IV EMUL
INTRAVENOUS | Status: DC | PRN
  Administered 2019-03-04: 140 ug/kg/min via INTRAVENOUS

## 2019-03-04 NOTE — Anesthesia Preprocedure Evaluation (Signed)
Anesthesia Evaluation  Patient identified by MRN, date of birth, ID band Patient awake    Reviewed: Allergy & Precautions, H&P , NPO status , Patient's Chart, lab work & pertinent test results, reviewed documented beta blocker date and time   Airway Mallampati: III   Neck ROM: full    Dental  (+) Poor Dentition, Teeth Intact   Pulmonary shortness of breath, asthma , sleep apnea , pneumonia, COPD,    Pulmonary exam normal        Cardiovascular Exercise Tolerance: Poor hypertension, On Medications + CAD, + Past MI, + Peripheral Vascular Disease and +CHF  + dysrhythmias Supra Ventricular Tachycardia  Rhythm:regular Rate:Normal     Neuro/Psych  Headaches, PSYCHIATRIC DISORDERS Anxiety Depression Bipolar Disorder Schizophrenia  Neuromuscular disease    GI/Hepatic GERD  Medicated,(+) Hepatitis -  Endo/Other  diabetesHypothyroidism Morbid obesity  Renal/GU Renal disease  negative genitourinary   Musculoskeletal   Abdominal   Peds  Hematology negative hematology ROS (+)   Anesthesia Other Findings Past Medical History: No date: Anxiety No date: Arthritis No date: Asthma No date: Bipolar depression (HCC) No date: CHF (congestive heart failure) (HCC) No date: Chronic headaches     Comment:  migraines have lessened No date: Chronic kidney disease     Comment:  ddstage 111 No date: COPD (chronic obstructive pulmonary disease) (HCC) No date: Coronary artery disease No date: Depression No date: Diabetic neuropathy (HCC) No date: Dyspnea     Comment:  uses inhalers for this No date: Dysrhythmia     Comment:  rapid No date: Fatty liver No date: GERD (gastroesophageal reflux disease) No date: Headache 2009: Heart attack ([place]) No date: Hyperlipidemia No date: Hypertension No date: Hypomagnesemia No date: Hypothyroid No date: Irregular heart rhythm No date: Liver lesion     Comment:  Favored to be benign on CT; MRI  of abd w/contract in Aug              2016. Liver Biopsy-Negative, March 2016 No date: Morbid obesity (HCC) No date: OSA (obstructive sleep apnea) No date: Peripheral vascular disease ([place]) No date: Pinched nerve     Comment:  Back No date: Pneumonia No date: RLS (restless legs syndrome) No date: Seasonal allergies No date: Sleep apnea     Comment:  uses oxygen at night 2L d/t cpap did not fit 10/15/2015: Type 2 diabetes, uncontrolled, with neuropathy (HCC)     Comment:  Overview:  Microalbumin 7.7 12/2010: Hgb A1c 10.2% 12/2010              and 9.7% 03/2011, eye exam 10/2009 [facility]  No date: Urinary incontinence No date: Vitamin D deficiency Past Surgical History: No date: ABDOMINAL HYSTERECTOMY No date: ABDOMINAL HYSTERECTOMY     Comment:  Partial 2007: bladder stim No date: BLADDER SURGERY     Comment:  x 2. tacking of bladder and the other was the stimulator              placement No date: CHOLECYSTECTOMY 12/31/2017: INTERSTIM-GENERATOR CHANGE; N/A     Comment:  Procedure: INTERSTIM-GENERATOR CHANGE;  Surgeon:               Bjorn Loser, MD;  Location: [facility] ORS;  Service:               Urology;  Laterality: N/A; March 2016: LIVER BIOPSY     Comment:  Negative 2015: LUNG BIOPSY     Comment:  small spot on lung No date: TUBAL  LIGATION   Reproductive/Obstetrics negative OB ROS                             Anesthesia Physical Anesthesia Plan  ASA: IV  Anesthesia Plan: General   Post-op Pain Management:    Induction:   PONV Risk Score and Plan:   Airway Management Planned:   Additional Equipment:   Intra-op Plan:   Post-operative Plan:   Informed Consent: I have reviewed the patients History and Physical, chart, labs and discussed the procedure including the risks, benefits and alternatives for the proposed anesthesia with the patient or authorized representative who has indicated his/her understanding and acceptance.      Dental Advisory Given  Plan Discussed with: CRNA  Anesthesia Plan Comments:         Anesthesia Quick Evaluation

## 2019-03-04 NOTE — Op Note (Signed)
[facility] Gastroenterology Patient Name: [name] Procedure Date: 03/04/2019 9:08 AM MRN: [id number] Account #: [id number] Date of Birth: 02/17/1966 Admit Type: Outpatient Age: 53 Room: [facility] ENDO ROOM 4 Gender: Female Note Status: Finalized Procedure:            Upper GI endoscopy Indications:          Iron deficiency anemia Providers:            [name] MD, MD Medicines:            Propofol per Anesthesia Complications:        No immediate complications. Procedure:            Pre-Anesthesia Assessment:                       - Prior to the procedure, a History and Physical was                        performed, and patient medications and allergies were                        reviewed. [name] tolerance of previous                        anesthesia was also reviewed. The risks and benefits of                        the procedure and the sedation options and risks were                        discussed with the patient. All questions were                        answered, and informed consent was obtained. Prior                        Anticoagulants: The patient has taken no previous                        anticoagulant or antiplatelet agents. ASA Grade                        Assessment: II - A patient with mild systemic disease.                        After reviewing the risks and benefits, the patient was                        deemed in satisfactory condition to undergo the                        procedure.                       After obtaining informed consent, the endoscope was                        passed under direct vision. Throughout the procedure,                        the patient's blood pressure,  pulse, and oxygen                        saturations were monitored continuously. The Endoscope                        was introduced through the mouth, and advanced to the                        second part of duodenum. The upper GI endoscopy was                         accomplished without difficulty. The patient tolerated                        the procedure well. Findings:      The examined esophagus was normal.      One oozing superficial gastric ulcer was found at the pylorus. Biopsies       were taken with a cold forceps for Helicobacter pylori testing.      The examined duodenum was normal. Biopsies were taken with a cold       forceps for histology. Impression:           - Normal esophagus.                       - Oozing gastric ulcer. Biopsied.                       - Normal examined duodenum. Biopsied. Recommendation:       - Discharge patient to home.                       - Resume previous diet.                       - Continue present medications.                       - Await pathology results.                       - Perform a colonoscopy today. Procedure Code(s):    --- Professional ---                       [phone], Esophagogastroduodenoscopy, flexible, transoral;                        with biopsy, single or multiple Diagnosis Code(s):    --- Professional ---                       D50.9, Iron deficiency anemia, unspecified                       K25.4, Chronic or unspecified gastric ulcer with                        hemorrhage CPT copyright 2019 American Medical Association. All rights reserved. The codes documented in this report are preliminary and upon coder review may  be revised to meet current compliance requirements. [name] MD, MD 03/04/2019 9:31:12 AM This report has been signed electronically. Number of  Addenda: 0 Note Initiated On: 03/04/2019 9:08 AM Estimated Blood Loss: Estimated blood loss: none.      [facility]

## 2019-03-04 NOTE — Op Note (Signed)
[facility] Gastroenterology Patient Name: [name] Procedure Date: 03/04/2019 9:08 AM MRN: [id number] Account #: [id number] Date of Birth: Jan 12, 1966 Admit Type: Outpatient Age: 53 Room: [facility] ENDO ROOM 4 Gender: Female Note Status: Finalized Procedure:            Colonoscopy Indications:          Iron deficiency anemia Providers:            [name] MD, MD Medicines:            Propofol per Anesthesia Complications:        No immediate complications. Procedure:            Pre-Anesthesia Assessment:                       - Prior to the procedure, a History and Physical was                        performed, and patient medications and allergies were                        reviewed. [name] tolerance of previous                        anesthesia was also reviewed. The risks and benefits of                        the procedure and the sedation options and risks were                        discussed with the patient. All questions were                        answered, and informed consent was obtained. Prior                        Anticoagulants: The patient has taken no previous                        anticoagulant or antiplatelet agents. ASA Grade                        Assessment: II - A patient with mild systemic disease.                        After reviewing the risks and benefits, the patient was                        deemed in satisfactory condition to undergo the                        procedure.                       After obtaining informed consent, the colonoscope was                        passed under direct vision. Throughout the procedure,                        the patient's blood pressure, pulse, and  oxygen                        saturations were monitored continuously. The                        Colonoscope was introduced through the anus and                        advanced to the the cecum, identified by appendiceal       orifice and ileocecal valve. The colonoscopy was                        performed without difficulty. The patient tolerated the                        procedure well. The quality of the bowel preparation                        was excellent. Findings:      The perianal and digital rectal examinations were normal.      A 7 mm polyp was found in the ascending colon. The polyp was sessile.       The polyp was removed with a cold snare. Resection and retrieval were       complete.      A 4 mm polyp was found in the transverse colon. The polyp was sessile.       The polyp was removed with a cold snare. Resection and retrieval were       complete.      Non-bleeding external internal hemorrhoids were found during       retroflexion. The hemorrhoids were Grade II (internal hemorrhoids that       prolapse but reduce spontaneously). Impression:           - One 7 mm polyp in the ascending colon, removed with a                        cold snare. Resected and retrieved.                       - One 4 mm polyp in the transverse colon, removed with                        a cold snare. Resected and retrieved.                       - Non-bleeding external internal hemorrhoids. Recommendation:       - Discharge patient to home.                       - Resume previous diet.                       - Continue present medications.                       - Await pathology results.                       - Repeat colonoscopy in 5 years for surveillance.                       -  If anemia presists then consider capsule endoscopy. Procedure Code(s):    --- Professional ---                       [phone], Colonoscopy, flexible; with removal of tumor(s),                        polyp(s), or other lesion(s) by snare technique Diagnosis Code(s):    --- Professional ---                       D50.9, Iron deficiency anemia, unspecified                       K63.5, Polyp of colon CPT copyright 2019 American Medical  Association. All rights reserved. The codes documented in this report are preliminary and upon coder review may  be revised to meet current compliance requirements. [name] MD, MD 03/04/2019 9:49:56 AM This report has been signed electronically. Number of Addenda: 0 Note Initiated On: 03/04/2019 9:08 AM Scope Withdrawal Time: 0 hours 6 minutes 52 seconds  Total Procedure Duration: 0 hours 15 minutes 42 seconds  Estimated Blood Loss: Estimated blood loss: none.      [facility]

## 2019-03-04 NOTE — Transfer of Care (Signed)
Immediate Anesthesia Transfer of Care Note  Patient: [name]  Procedure(s) Performed: COLONOSCOPY WITH PROPOFOL (N/A ) ESOPHAGOGASTRODUODENOSCOPY (EGD) WITH PROPOFOL (N/A )  Patient Location: PACU and Endoscopy Unit  Anesthesia Type:General  Level of Consciousness: drowsy  Airway & Oxygen Therapy: Patient Spontanous Breathing  Post-op Assessment: Report given to RN and Post -op Vital signs reviewed and stable  Post vital signs: Reviewed and stable  Last Vitals:  Vitals Value Taken Time  BP 106/44 03/04/19 0952  Temp 36.1 C 03/04/19 0952  Pulse 92 03/04/19 0954  Resp 21 03/04/19 0954  SpO2 96 % 03/04/19 0954  Vitals shown include unvalidated device data.  Last Pain:  Vitals:   03/04/19 0952  TempSrc: Tympanic  PainSc: 0-No pain         Complications: No apparent anesthesia complications

## 2019-03-04 NOTE — Anesthesia Post-op Follow-up Note (Signed)
Anesthesia QCDR form completed.        

## 2019-03-04 NOTE — Anesthesia Postprocedure Evaluation (Signed)
Anesthesia Post Note  Patient: [name]  Procedure(s) Performed: COLONOSCOPY WITH PROPOFOL (N/A ) ESOPHAGOGASTRODUODENOSCOPY (EGD) WITH PROPOFOL (N/A )  Patient location during evaluation: Endoscopy Anesthesia Type: General Level of consciousness: awake and alert Pain management: pain level controlled Vital Signs Assessment: post-procedure vital signs reviewed and stable Respiratory status: spontaneous breathing, nonlabored ventilation, respiratory function stable and patient connected to nasal cannula oxygen Cardiovascular status: blood pressure returned to baseline and stable Postop Assessment: no apparent nausea or vomiting Anesthetic complications: no     Last Vitals:  Vitals:   03/04/19 0952 03/04/19 1002  BP: (!) 106/44 112/73  Pulse: 92 89  Resp: 20 (!) 23  Temp: (!) 36.1 C   SpO2: 96% 97%    Last Pain:  Vitals:   03/04/19 1012  TempSrc:   PainSc: 0-No pain                 Akylah Hascall S

## 2019-03-04 NOTE — H&P (Signed)
Stacey Lame, MD [address], [place] [address] Phone:[phone] Fax : [phone]  Primary Care Physician:  Arnetha Courser, MD Primary Gastroenterologist:  Dr. Allen Norris  Pre-Procedure History & Physical: HPI:  [name] is a 53 y.o. female is here for an endoscopy and colonoscopy.   Past Medical History:  Diagnosis Date  . Anxiety   . Arthritis   . Asthma   . Bipolar depression ([place])   . CHF (congestive heart failure) ([place])   . Chronic headaches    migraines have lessened  . Chronic kidney disease    ddstage 111  . COPD (chronic obstructive pulmonary disease) ([place])   . Coronary artery disease   . Depression   . Diabetic neuropathy ([place])   . Dyspnea    uses inhalers for this  . Dysrhythmia    rapid  . Fatty liver   . GERD (gastroesophageal reflux disease)   . Headache   . Heart attack ([place]) 2009  . Hyperlipidemia   . Hypertension   . Hypomagnesemia   . Hypothyroid   . Irregular heart rhythm   . Liver lesion    Favored to be benign on CT; MRI of abd w/contract in Aug 2016. Liver Biopsy-Negative, March 2016  . Morbid obesity ([place])   . OSA (obstructive sleep apnea)   . Peripheral vascular disease ([place])   . Pinched nerve    Back  . Pneumonia   . RLS (restless legs syndrome)   . Seasonal allergies   . Sleep apnea    uses oxygen at night 2L d/t cpap did not fit  . Type 2 diabetes, uncontrolled, with neuropathy ([place]) 10/15/2015   Overview:  Microalbumin 7.7 12/2010: Hgb A1c 10.2% 12/2010 and 9.7% 03/2011, eye exam 10/2009 [facility]   . Urinary incontinence   . Vitamin D deficiency     Past Surgical History:  Procedure Laterality Date  . ABDOMINAL HYSTERECTOMY    . ABDOMINAL HYSTERECTOMY     Partial  . bladder stim  2007  . BLADDER SURGERY     x 2. tacking of bladder and the other was the stimulator placement  . CHOLECYSTECTOMY    . COLONOSCOPY    . INTERSTIM-GENERATOR CHANGE N/A 12/31/2017   Procedure: INTERSTIM-GENERATOR  CHANGE;  Surgeon: Bjorn Loser, MD;  Location: [facility] ORS;  Service: Urology;  Laterality: N/A;  . LIVER BIOPSY  March 2016   Negative  . LUNG BIOPSY  2015   small spot on lung  . TUBAL LIGATION    . UPPER GI ENDOSCOPY      Prior to Admission medications   Medication Sig Start Date End Date Taking? Authorizing Provider  albuterol (PROVENTIL HFA;VENTOLIN HFA) 108 (90 Base) MCG/ACT inhaler Inhale 2 puffs into the lungs every 4 (four) hours as needed for wheezing or shortness of breath. 10/04/18  Yes Lada, Satira Anis, MD  atorvastatin (LIPITOR) 80 MG tablet Take 80 mg by mouth at bedtime.    Yes [provider]  budesonide-formoterol (SYMBICORT) 160-4.5 MCG/ACT inhaler Inhale 2 puffs into the lungs 2 (two) times daily. 10/04/18  Yes Lada, Satira Anis, MD  busPIRone (BUSPAR) 10 MG tablet Take 10 mg by mouth 2 (two) times daily.   Yes [provider]  DULoxetine (CYMBALTA) 60 MG capsule Take 120 mg by mouth daily.   Yes [provider]  empagliflozin (JARDIANCE) 25 MG TABS tablet Take 25 mg by mouth daily.   Yes [provider]  ezetimibe (ZETIA)  10 MG tablet Take 10 mg by mouth daily.  01/05/17  Yes [provider]  fluticasone (FLONASE) 50 MCG/ACT nasal spray Place 2 sprays into both nostrils daily. 04/16/18  Yes Lada, Satira Anis, MD  gabapentin (NEURONTIN) 300 MG capsule Take 1 capsule (300 mg total) by mouth 2 (two) times daily. 02/20/19  Yes Poulose, Bethel Born, NP  HUMULIN R U-500 KWIKPEN 500 UNIT/ML kwikpen Inject 70 Units as directed 3 (three) times daily.    Yes [provider]  hydrOXYzine (VISTARIL) 50 MG capsule Take 50-100 mg by mouth daily as needed for anxiety.    Yes [provider]  insulin aspart (NOVOLOG) 100 UNIT/ML injection Inject 5-25 Units into the skin 3 (three) times daily before meals.    Yes [provider]  lamoTRIgine (LAMICTAL) 200 MG tablet Take 200 mg by mouth 2 (two) times daily.    Yes [provider]  lisinopril (PRINIVIL,ZESTRIL) 2.5 MG tablet Take 1 tablet (2.5 mg total) by mouth daily. 12/17/18  Yes Lada, Satira Anis, MD  metoprolol tartrate (LOPRESSOR) 25 MG tablet Take one tablet 25 mg in the am and 1/2 tab in the pm 02/20/19  Yes Poulose, Bethel Born, NP  Omega-3 Fatty Acids (FISH OIL PO) Take by mouth daily.   Yes [provider]  pantoprazole (PROTONIX) 40 MG tablet Take 1 tablet (40 mg total) by mouth daily. 11/06/18  Yes Stacey Lame, MD  QUEtiapine (SEROQUEL) 400 MG tablet Take 400 mg by mouth at bedtime.   Yes [provider]  traZODone (DESYREL) 100 MG tablet Take 100 mg by mouth at bedtime.   Yes [provider]  TRULICITY 1.5 JX/9.1YN SOPN Inject 1.5 mLs as directed once a week.  03/26/18  Yes [provider]  ULTICARE SHORT PEN NEEDLES 31G X 8 MM [place]  07/31/18  Yes [provider]  clindamycin (CLEOCIN) 150 MG capsule 2 tabs tid 02/16/19   Johnn Hai, PA-C  levothyroxine (SYNTHROID) 50 MCG tablet 1 TAB daily three days a week,  1.5 TABS four days a week (Mon, Wed, Fri, Sat for example) 02/25/19   Poulose, Bethel Born, NP  mupirocin ointment (BACTROBAN) 2 % Place 1 application into the nose 2 (two) times daily. 11/22/18   Hubbard Hartshorn, FNP  Na Sulfate-K Sulfate-Mg Sulf (SUPREP BOWEL PREP KIT) 17.5-3.13-1.6 GM/177ML SOLN Take 1 kit by mouth as directed. Patient not taking: Reported on 02/20/2019 02/05/19   Stacey Lame, MD  ondansetron (ZOFRAN) 4 MG tablet Take 1 tablet (4 mg total) by mouth every 8 (eight) hours as needed for nausea or vomiting. 02/05/19   Stacey Lame, MD    Allergies as of 02/13/2019 - Review Complete 02/11/2019  Allergen Reaction Noted  . Mirtazapine Anaphylaxis   . Morphine and related  10/20/2013  . Sulfamethoxazole-trimethoprim Rash 06/09/2008  . Amoxicillin er Nausea And Vomiting 12/24/2017  . Codeine Itching   . Hydrocodone-acetaminophen Rash   . Keflex [cephalexin] Rash 07/04/2018  .  Prasterone Nausea And Vomiting 12/24/2017    Family History  Problem Relation Age of Onset  . Diabetes Mother   . Heart disease Mother   . Hyperlipidemia Mother   . Hypertension Mother   . Kidney disease Mother   . Mental illness Mother   . Hypothyroidism Mother   . Stroke Mother   . Osteoporosis Mother   . Glaucoma Mother   . Congestive Heart Failure Mother   . Hypertension Father   . Asthma Daughter   .  Cancer Daughter        throat  . Bipolar disorder Daughter   . Heart disease Maternal Grandfather   . Rheum arthritis Maternal Grandfather   . Cancer Maternal Grandfather        liver  . Kidney disease Maternal Grandfather   . Cancer Paternal Grandmother        lung  . Kidney disease Brother   . Breast cancer Maternal Grandmother 60  . Bipolar disorder Daughter   . Hypertension Daughter   . Restless legs syndrome Daughter     Social History   Socioeconomic History  . Marital status: Married    Spouse name: Not on file  . Number of children: 2  . Years of education: 55  . Highest education level: Not on file  Occupational History  . Occupation: disabled  Social Needs  . Financial resource strain: Not hard at all  . Food insecurity    Worry: Never true    Inability: Never true  . Transportation needs    Medical: No    Non-medical: No  Tobacco Use  . Smoking status: Never Smoker  . Smokeless tobacco: Never Used  Substance and Sexual Activity  . Alcohol use: No  . Drug use: No  . Sexual activity: Yes    Partners: Male    Birth control/protection: Surgical  Lifestyle  . Physical activity    Days per week: 0 days    Minutes per session: 0 min  . Stress: Very much  Relationships  . Social connections    Talks on phone: More than three times a week    Gets together: More than three times a week    Attends religious service: Never    Active member of club or organization: No    Attends meetings of clubs or organizations: Never    Relationship status:  Married  . Intimate partner violence    Fear of current or ex partner: No    Emotionally abused: No    Physically abused: No    Forced sexual activity: No  Other Topics Concern  . Not on file  Social History Narrative   ** Merged History Encounter **       16-20 oz of caffeine a day     Review of Systems: See HPI, otherwise negative ROS  Physical Exam: There were no vitals taken for this visit. General:   Alert,  pleasant and cooperative in NAD Head:  Normocephalic and atraumatic. Neck:  Supple; no masses or thyromegaly. Lungs:  Clear throughout to auscultation.    Heart:  Regular rate and rhythm. Abdomen:  Soft, nontender and nondistended. Normal bowel sounds, without guarding, and without rebound.   Neurologic:  Alert and  oriented x4;  grossly normal neurologically.  Impression/Plan: [name] is here for an endoscopy and colonoscopy to be performed for IDA  Risks, benefits, limitations, and alternatives regarding  endoscopy and colonoscopy have been reviewed with the patient.  Questions have been answered.  All parties agreeable.   Stacey Lame, MD  03/04/2019, 8:33 AM

## 2019-03-06 LAB — SURGICAL PATHOLOGY

## 2019-03-09 NOTE — Progress Notes (Signed)
No show  This encounter was created in error - please disregard.

## 2019-03-10 ENCOUNTER — Other Ambulatory Visit: Payer: Self-pay

## 2019-03-10 ENCOUNTER — Encounter: Payer: Medicare HMO | Admitting: Cardiovascular Disease

## 2019-03-10 DIAGNOSIS — R079 Chest pain, unspecified: Secondary | ICD-10-CM

## 2019-03-10 DIAGNOSIS — N183 Chronic kidney disease, stage 3 unspecified: Secondary | ICD-10-CM

## 2019-03-10 DIAGNOSIS — I10 Essential (primary) hypertension: Secondary | ICD-10-CM

## 2019-03-10 DIAGNOSIS — E1140 Type 2 diabetes mellitus with diabetic neuropathy, unspecified: Secondary | ICD-10-CM

## 2019-03-10 DIAGNOSIS — IMO0002 Reserved for concepts with insufficient information to code with codable children: Secondary | ICD-10-CM

## 2019-03-10 DIAGNOSIS — J449 Chronic obstructive pulmonary disease, unspecified: Secondary | ICD-10-CM

## 2019-03-10 DIAGNOSIS — E782 Mixed hyperlipidemia: Secondary | ICD-10-CM

## 2019-03-13 ENCOUNTER — Telehealth: Payer: Self-pay

## 2019-03-13 NOTE — Telephone Encounter (Signed)
Pt notified of procedure results. Pt advised to increase her PPI to twice daily. Will contact her in 3 weeks to request repeat lab to check anemia.

## 2019-03-13 NOTE — Telephone Encounter (Signed)
-----   Message from [name], MD sent at 03/13/2019  6:44 AM EDT ----- This patient know that her polyps were precancerous and she needs a repeat colonoscopy in 5 years.  The biopsies of her small intestine did not show any cystic sprue but there was inflammation in the stomach that could be causing her anemia.  The patient should double up on her proton asked to twice a day if she is not doing that already.  She should then have her blood work checked in 3 weeks to see if her anemia is still present if it is then she will need a capsule endoscopy.

## 2019-03-25 NOTE — Progress Notes (Deleted)
03/26/2019 12:56 PM   [name] May 26, 1966 [id number]  Referring provider: Arnetha Yang, [name] [name] [place],  [name] [address]  No chief complaint on file.   HPI: [name] is a 53 -year-old female who presents today as a referral from their PCP, Dr. Sanda Yang, for positive urine dips for blood.      Patient was found to have *** hematuria on *** with *** RBC's/hpf.  Patient *** does/doesn't have a prior history of microscopic hematuria.    She/He does/ does not have a prior history of recurrent urinary tract infections, nephrolithiasis, trauma to the genitourinary tract, BPH or malignancies of the genitourinary tract. ***  She/He does/does not have a family medical history of nephrolithiasis, malignancies of the genitourinary tract or hematuria. ***  Today, she/he are having/not having symptoms of frequent urination, urgency, dysuria, nocturia, incontinence, hesitancy, intermittency, straining to urinate or a weak urinary stream.  Her/His UA today demonstrates ***.  Today, she/he are having/not having symptoms of frequent urination, urgency, dysuria, nocturia, incontinence, hesitancy, intermittency, straining to urinate or a weak urinary stream.  Her/His UA today demonstrates ***.  Patient denies any gross hematuria, dysuria or suprapubic/flank pain.  Patient denies any fevers, chills, nausea or vomiting.  ***  She/He have/have not had any recent imaging studies. ***  He/She are/are not a smoker. She/He is a former smoker, with a*** ppd history.  Quit *** years ago.  They are/are not exposed to secondhand smoke.  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   ***  He/She has HTN. ***   He/She has a high BMI.     PMH: Past Medical History:  Diagnosis Date  . Anxiety   . Arthritis   . Asthma   . Bipolar depression ([place])   . CHF (congestive heart failure) ([place])   . Chronic headaches    migraines have lessened  .  Chronic kidney disease    ddstage 111  . COPD (chronic obstructive pulmonary disease) ([place])   . Coronary artery disease   . Depression   . Diabetic neuropathy ([place])   . Dyspnea    uses inhalers for this  . Dysrhythmia    rapid  . Fatty liver   . GERD (gastroesophageal reflux disease)   . Headache   . Heart attack ([place]) 2009  . Hyperlipidemia   . Hypertension   . Hypomagnesemia   . Hypothyroid   . Irregular heart rhythm   . Liver lesion    Favored to be benign on CT; MRI of abd w/contract in Aug 2016. Liver Biopsy-Negative, March 2016  . Morbid obesity ([place])   . OSA (obstructive sleep apnea)   . Peripheral vascular disease ([place])   . Pinched nerve    Back  . Pneumonia   . RLS (restless legs syndrome)   . Seasonal allergies   . Sleep apnea    uses oxygen at night 2L d/t cpap did not fit  . Type 2 diabetes, uncontrolled, with neuropathy ([place]) 10/15/2015   Overview:  Microalbumin 7.7 12/2010: Hgb A1c 10.2% 12/2010 and 9.7% 03/2011, eye exam 10/2009 [facility]   . Urinary incontinence   . Vitamin D deficiency     Surgical History: Past Surgical History:  Procedure Laterality Date  . ABDOMINAL HYSTERECTOMY    . ABDOMINAL HYSTERECTOMY     Partial  . bladder stim  2007  . BLADDER SURGERY     x 2. tacking of bladder  and the other was the stimulator placement  . CHOLECYSTECTOMY    . COLONOSCOPY    . COLONOSCOPY WITH PROPOFOL N/A 03/04/2019   Procedure: COLONOSCOPY WITH PROPOFOL;  Surgeon: Stacey Lame, MD;  Location: [facility] ENDOSCOPY;  Service: Endoscopy;  Laterality: N/A;  . ESOPHAGOGASTRODUODENOSCOPY (EGD) WITH PROPOFOL N/A 03/04/2019   Procedure: ESOPHAGOGASTRODUODENOSCOPY (EGD) WITH PROPOFOL;  Surgeon: Stacey Lame, MD;  Location: [facility] ENDOSCOPY;  Service: Endoscopy;  Laterality: N/A;  . INTERSTIM-GENERATOR CHANGE N/A 12/31/2017   Procedure: INTERSTIM-GENERATOR CHANGE;  Surgeon: Stacey Loser, MD;  Location: [facility] ORS;  Service: Urology;  Laterality: N/A;  . LIVER BIOPSY   March 2016   Negative  . LUNG BIOPSY  2015   small spot on lung  . TUBAL LIGATION    . UPPER GI ENDOSCOPY      Home Medications:  Allergies as of 03/26/2019      Reactions   Mirtazapine Anaphylaxis   REACTION: closes throat   Morphine And Related    anaphylaxis   Sulfamethoxazole-trimethoprim Rash   REACTION: [place]ps all over   Amoxicillin Er Nausea And Vomiting   Has patient had a PCN reaction causing immediate rash, facial/tongue/throat swelling, SOB or lightheadedness with hypotension: No Has patient had a PCN reaction causing severe rash involving mucus membranes or skin necrosis: No Has patient had a PCN reaction that required hospitalization: No Has patient had a PCN reaction occurring within the last 10 years: Yes If all of the above answers are "NO", then may proceed with Cephalosporin use.   Codeine Itching   REACTION: itch, rash   Hydrocodone-acetaminophen Rash   REACTION: rash   Keflex [cephalexin] Rash   Prasterone Nausea And Vomiting      Medication List       Accurate as of March 25, 2019 12:56 PM. If you have any questions, ask your nurse or doctor.        albuterol 108 (90 Base) MCG/ACT inhaler Commonly known as: VENTOLIN HFA Inhale 2 puffs into the lungs every 4 (four) hours as needed for wheezing or shortness of breath.   atorvastatin 80 MG tablet Commonly known as: LIPITOR Take 80 mg by mouth at bedtime.   budesonide-formoterol 160-4.5 MCG/ACT inhaler Commonly known as: SYMBICORT Inhale 2 puffs into the lungs 2 (two) times daily.   busPIRone 10 MG tablet Commonly known as: BUSPAR Take 10 mg by mouth 2 (two) times daily.   clindamycin 150 MG capsule Commonly known as: Cleocin 2 tabs tid   DULoxetine 60 MG capsule Commonly known as: CYMBALTA Take 120 mg by mouth daily.   empagliflozin 25 MG Tabs tablet Commonly known as: JARDIANCE Take 25 mg by mouth daily.   ezetimibe 10 MG tablet Commonly known as: ZETIA Take 10 mg by mouth daily.    FISH OIL PO Take by mouth daily.   fluticasone 50 MCG/ACT nasal spray Commonly known as: FLONASE Place 2 sprays into both nostrils daily.   gabapentin 300 MG capsule Commonly known as: Neurontin Take 1 capsule (300 mg total) by mouth 2 (two) times daily.   HumuLIN R U-500 KwikPen 500 UNIT/ML kwikpen Generic drug: insulin regular human CONCENTRATED Inject 70 Units as directed 3 (three) times daily.   hydrOXYzine 50 MG capsule Commonly known as: VISTARIL Take 50-100 mg by mouth daily as needed for anxiety.   insulin aspart 100 UNIT/ML injection Commonly known as: novoLOG Inject 5-25 Units into the skin 3 (three) times daily before meals.   lamoTRIgine 200 MG tablet Commonly known as: LAMICTAL  Take 200 mg by mouth 2 (two) times daily.   levothyroxine 50 MCG tablet Commonly known as: SYNTHROID 1 TAB daily three days a week,  1.5 TABS four days a week (Mon, Wed, Fri, Sat for example)   lisinopril 2.5 MG tablet Commonly known as: ZESTRIL Take 1 tablet (2.5 mg total) by mouth daily.   metoprolol tartrate 25 MG tablet Commonly known as: LOPRESSOR Take one tablet 25 mg in the am and 1/2 tab in the pm   mupirocin ointment 2 % Commonly known as: Bactroban Place 1 application into the nose 2 (two) times daily.   Na Sulfate-K Sulfate-Mg Sulf 17.5-3.13-1.6 GM/177ML Soln Commonly known as: Suprep Bowel Prep Kit Take 1 kit by mouth as directed.   ondansetron 4 MG tablet Commonly known as: ZOFRAN Take 1 tablet (4 mg total) by mouth every 8 (eight) hours as needed for nausea or vomiting.   pantoprazole 40 MG tablet Commonly known as: PROTONIX Take 1 tablet (40 mg total) by mouth daily.   QUEtiapine 400 MG tablet Commonly known as: SEROQUEL Take 400 mg by mouth at bedtime.   traZODone 100 MG tablet Commonly known as: DESYREL Take 100 mg by mouth at bedtime.   Trulicity 1.5 IW/9.7LG Sopn Generic drug: Dulaglutide Inject 1.5 mLs as directed once a week.   UltiCare Short  Pen Needles 31G X 8 MM Misc Generic drug: Insulin Pen Needle       Allergies:  Allergies  Allergen Reactions  . Mirtazapine Anaphylaxis    REACTION: closes throat  . Morphine And Related     anaphylaxis  . Sulfamethoxazole-Trimethoprim Rash    REACTION: [place]ps all over  . Amoxicillin Er Nausea And Vomiting    Has patient had a PCN reaction causing immediate rash, facial/tongue/throat swelling, SOB or lightheadedness with hypotension: No Has patient had a PCN reaction causing severe rash involving mucus membranes or skin necrosis: No Has patient had a PCN reaction that required hospitalization: No Has patient had a PCN reaction occurring within the last 10 years: Yes If all of the above answers are "NO", then may proceed with Cephalosporin use.  . Codeine Itching    REACTION: itch, rash  . Hydrocodone-Acetaminophen Rash    REACTION: rash  . Keflex [Cephalexin] Rash  . Prasterone Nausea And Vomiting    Family History: Family History  Problem Relation Age of Onset  . Diabetes Mother   . Heart disease Mother   . Hyperlipidemia Mother   . Hypertension Mother   . Kidney disease Mother   . Mental illness Mother   . Hypothyroidism Mother   . Stroke Mother   . Osteoporosis Mother   . Glaucoma Mother   . Congestive Heart Failure Mother   . Hypertension Father   . Asthma Daughter   . Cancer Daughter        throat  . Bipolar disorder Daughter   . Heart disease Maternal Grandfather   . Rheum arthritis Maternal Grandfather   . Cancer Maternal Grandfather        liver  . Kidney disease Maternal Grandfather   . Cancer Paternal Grandmother        lung  . Kidney disease Brother   . Breast cancer Maternal Grandmother 60  . Bipolar disorder Daughter   . Hypertension Daughter   . Restless legs syndrome Daughter     Social History:  reports that she has never smoked. She has never used smokeless tobacco. She reports that she does not drink alcohol or  use drugs.  ROS:                                         Physical Exam: There were no vitals taken for this visit.  Constitutional:  Well nourished. Alert and oriented, No acute distress. HEENT: [place] AT, moist mucus membranes.  Trachea midline, no masses. Cardiovascular: No clubbing, cyanosis, or edema. Respiratory: Normal respiratory effort, no increased work of breathing. GI: Abdomen is soft, non tender, non distended, no abdominal masses. Liver and spleen not palpable.  No hernias appreciated.  Stool sample for occult testing is not indicated.   GU: No CVA tenderness.  No bladder fullness or masses.  *** external genitalia, *** pubic hair distribution, no lesions.  Normal urethral meatus, no lesions, no prolapse, no discharge.   No urethral masses, tenderness and/or tenderness. No bladder fullness, tenderness or masses. *** vagina mucosa, *** estrogen effect, no discharge, no lesions, *** pelvic support, *** cystocele and *** rectocele noted.  No cervical motion tenderness.  Uterus is freely mobile and non-fixed.  No adnexal/parametria masses or tenderness noted.  Anus and perineum are without rashes or lesions.   ***  Skin: No rashes, bruises or suspicious lesions. Lymph: No cervical or inguinal adenopathy. Neurologic: Grossly intact, no focal deficits, moving all 4 extremities. Psychiatric: Normal mood and affect.   Laboratory Data: Lab Results  Component Value Date   WBC 8.4 01/03/2019   HGB 12.1 01/03/2019   HCT 37.5 01/03/2019   MCV 89.1 01/03/2019   PLT 282 01/03/2019    Lab Results  Component Value Date   CREATININE 1.33 (H) 01/03/2019    No results found for: PSA  No results found for: TESTOSTERONE  Lab Results  Component Value Date   HGBA1C 8.3 08/26/2018    Lab Results  Component Value Date   TSH 1.17 02/24/2019       Component Value Date/Time   CHOL 196 09/28/2016 1456   CHOL 196 09/22/2015 1339   CHOL 141 10/25/2013 0314   HDL 33 (L) 09/28/2016 1456   HDL  35 (L) 09/22/2015 1339   HDL 9 (L) 10/25/2013 0314   CHOLHDL 5.9 (H) 09/28/2016 1456   VLDL NOT CALC 09/28/2016 1456   VLDL SEE COMMENT 10/25/2013 0314   LDLCALC NOT CALC 09/28/2016 1456   LDLCALC 99 09/22/2015 1339   LDLCALC SEE COMMENT 10/25/2013 8119    Lab Results  Component Value Date   AST 39 (H) 10/04/2018   Lab Results  Component Value Date   ALT 44 (H) 10/04/2018   No components found for: ALKALINEPHOPHATASE No components found for: BILIRUBINTOTAL  No results found for: ESTRADIOL   Urinalysis ***  Pertinent Imaging: ***  Assessment & Plan:  ***  1. *** hematuria Explained to the patient that there are a number of causes that can be associated with blood in the urine, such as stones, *** BPH, UTI's, damage to the urinary tract and/or cancer. At this time, I felt that the patient warranted further urologic evaluation.   The AUA guidelines state that a CT urogram is the preferred imaging study to evaluate hematuria. I explained to the patient that a contrast material will be injected into a vein and that in rare instances, an allergic reaction can result and may even life threatening   The patient denies any allergies to contrast***, iodine and/or seafood*** and is not taking metformin.***  Her reproductive status is hysterectomy, postmenopausal, tubal ligation are unknown at this time.  We will obtain a serum pregnancy test today. ***  Explained to the patient that the imaging studies performed on ***are not the recommended studies by the AUA for the work-up of blood in the urine.    The imaging studies that were performed lack the detail of excluding some urological tumors. The recommended study is a CT urogram.  This study does require the use of contrast material.    At this time, he/she may choose to forego the appropriate study with the understanding that they are risking a missed diagnosis of a urological cancer and proceed with in office cystoscopy Explained to the  patient that almost half of all renal cell carcinoma is found incidentally and not to wait on the classic triad of flank pain, palpable abdominal mass and gross hematuria that is generally only seen in more advanced disease.  There are currently 64,000 new cases in the [place] and 14,000 deaths in the [place] every year.   They may also choose to undergo the appropriate study (CTU) or choose to undergo a cystoscopy with bilateral RTG's to complete the hematuria work up. ***  On occasion, we may need to resort to a non-contrast study such as a renal ultrasound or a non-contrast CT if the patient has a contrast allergy or renal insufficiency.  In the latter case,  I told him/her *** that an upper tract study without contrast will lack the detail of excluding some urologic tumors.  Because of this, he/she would need to undergo cystoscopy with bilateral retrogrades in the OR to complete the hematuria workup in addition to the imaging studies. ***  Following the imaging study,  I've recommended a cystoscopy. I described how this is performed, typically in an office setting with a flexible cystoscope. We described the risks, benefits, and possible side effects, the most common of which is a minor amount of blood in the urine and/or burning which usually resolves in 24 to 48 hours.  *** There is a 20% risk with gross hematuria and a 2% to 5% risk with microscopic hematuria of missing a bladder cancer.  Our goal is to identify the cancer in its early stage so as to give you the greater change of survival and a cure.   *** The patient had the opportunity to ask questions which were answered. Based upon this discussion, the patient is willing to proceed. Therefore, I've ordered: a CT Urogram and cystoscopy. ***  - The patient will return following all of the above for discussion of the results.   - UA ***  - Urine culture ***  - BUN + creatinine  ***    No follow-ups on file.  These notes generated with voice  recognition software. I apologize for typographical errors.  Zara Council, PA-C  [facility] [address] [place]  [place], [place] [address] [phone]

## 2019-03-26 ENCOUNTER — Ambulatory Visit: Payer: Medicare HMO | Admitting: Urology

## 2019-04-02 ENCOUNTER — Encounter: Payer: Self-pay | Admitting: Family Medicine

## 2019-04-08 ENCOUNTER — Telehealth: Payer: Self-pay

## 2019-04-08 NOTE — Telephone Encounter (Signed)
Spoke with pt regarding repeat CBC. Pt stated she had lots of blood work done at her PCP last week. She will contact them and have them fax those results.

## 2019-04-08 NOTE — Telephone Encounter (Signed)
-----   Message from [place], [place] sent at 03/13/2019 11:22 AM EDT ----- Pt will need recheck of cbc due to increasing her PPI. Pt aware.

## 2019-04-09 ENCOUNTER — Ambulatory Visit (INDEPENDENT_AMBULATORY_CARE_PROVIDER_SITE_OTHER): Payer: Medicare HMO | Admitting: Family Medicine

## 2019-04-09 ENCOUNTER — Encounter: Payer: Self-pay | Admitting: Family Medicine

## 2019-04-09 ENCOUNTER — Other Ambulatory Visit: Payer: Self-pay

## 2019-04-09 DIAGNOSIS — G2581 Restless legs syndrome: Secondary | ICD-10-CM | POA: Diagnosis not present

## 2019-04-09 DIAGNOSIS — R202 Paresthesia of skin: Secondary | ICD-10-CM

## 2019-04-09 DIAGNOSIS — IMO0002 Reserved for concepts with insufficient information to code with codable children: Secondary | ICD-10-CM

## 2019-04-09 DIAGNOSIS — E1140 Type 2 diabetes mellitus with diabetic neuropathy, unspecified: Secondary | ICD-10-CM | POA: Diagnosis not present

## 2019-04-09 DIAGNOSIS — E1165 Type 2 diabetes mellitus with hyperglycemia: Secondary | ICD-10-CM

## 2019-04-09 MED ORDER — GABAPENTIN 300 MG PO CAPS: ORAL_CAPSULE | ORAL | 1 refills | Status: DC

## 2019-04-09 NOTE — Progress Notes (Signed)
Name: [name]   MRN: [id number]    DOB: 1966/08/06   Date:04/09/2019       Progress Note  Subjective  Chief Complaint  Chief Complaint  Patient presents with  . Hand Pain    numbness states they feel like they are going to pop  . restless leg    I connected with  Zena Amos Saban on 04/09/19 at 11:20 AM EDT by telephone and verified that I am speaking with the correct person using two identifiers.   I discussed the limitations, risks, security and privacy concerns of performing an evaluation and management service by telephone and the availability of in person appointments. Staff also discussed with the patient that there may be a patient responsible charge related to this service. Patient Location: Home Provider Location: Home Additional Individuals present: None  HPI  Patient presents via telephone with concern for bilateral hand pain, tingling, and feeling of tightness ongoing for about 2 days; feels like her gabapentin is not working as well now, and she notes that this sensation is different from when she first started the gabapentin for neuropathy.  She is taking 377m twice daily of gabapentin. There are no particular areas of tenderness.   RLS: She is struggling with her RLS - her legs will not stay still at night and are quite painful.  She would like to go up on the gabapentin.  She is unable to take tylenol due to liver issues and  Unable to take ibuprofen due to recent diagnosis of stomach ulcer.   Patient Active Problem List   Diagnosis Date Noted  . Iron deficiency anemia due to chronic blood loss   . Polyp of ascending colon   . Chronic gastric ulcer with hemorrhage   . Type 2 diabetes with decreased circulation ([place] 11/07/2018  . Vitamin B12 deficiency 08/08/2018  . Encounter for Medicare annual wellness exam 10/30/2017  . Patient is full code 10/30/2017  . Disorder of bursae of shoulder region 10/09/2017  . Coronary artery disease 10/09/2017  . Plantar  fascial fibromatosis 10/09/2017  . Tendinitis of wrist 10/09/2017  . Schizoaffective disorder, bipolar type ([place] 09/28/2017  . Chest pain 09/10/2017  . Pleuritic chest pain 05/27/2017  . Hypertriglyceridemia 12/21/2016  . Pansinusitis 11/15/2016  . 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 Breast cancer screening 09/28/2016  . Urine frequency 09/28/2016  . Preventative health care 09/28/2016  . Fatigue 05/01/2016  . Urinary tract infection 03/14/2016  . Adenomatous colon polyp 10/15/2015  . Airway hyperreactivity 10/15/2015  . Bipolar affective disorder ([facility] 10/15/2015  . CN (constipation) 10/15/2015  . CAFL (chronic airflow limitation) ([facility] 10/15/2015  . Drug-induced hepatic toxicity 10/15/2015  . Blood in feces 10/15/2015  . HLD (hyperlipidemia) 10/15/2015  . Hypoxemia 10/15/2015  . NASH (nonalcoholic steatohepatitis) 10/15/2015  . Morbid obesity ([place] 10/15/2015  . Restless leg syndrome 10/15/2015  . Type 2 diabetes, uncontrolled, with neuropathy ([facility] 10/15/2015  . Elevated alkaline phosphatase level 09/22/2015  . Urinary bladder incontinence 09/22/2015  . Low back pain with left-sided sciatica 08/18/2015  . Encounter for monitoring tricyclic antidepressant therapy 05/13/2015  . Bipolar disorder ([facility] 05/11/2015  . Liver disease 05/11/2015  . Apnea, sleep 05/05/2015  . Heart valve disease 05/05/2015  . Chronic kidney disease, stage III (moderate) (HCC) 03/15/2015  . Migraine 03/12/2015  . COPD (chronic obstructive pulmonary disease) ([place] 01/20/2015  . 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[facility] 01/20/2015  . GERD (gastroesophageal reflux disease) 01/20/2015  .  Hypothyroid   . Vitamin D deficiency   . Urinary incontinence   . Liver lesion   . Hypomagnesemia   . Obstructive sleep apnea 10/20/2013  . Vaginitis 12/19/2011  . PERIPHERAL NEUROPATHY 03/04/2009  . HEMORRHOIDS, EXTERNAL 09/03/2008  . ALLERGIC RHINITIS 07/04/2007  . ASTHMA, PERSISTENT, MODERATE  07/04/2007  . INTERSTITIAL CYSTITIS 07/04/2007  . HYPERTENSION, BENIGN ESSENTIAL 06/19/2007    Social History   Tobacco Use  . Smoking status: Never Smoker  . Smokeless tobacco: Never Used  Substance Use Topics  . Alcohol use: No     Current Outpatient Medications:  .  albuterol (PROVENTIL HFA;VENTOLIN HFA) 108 (90 Base) MCG/ACT inhaler, Inhale 2 puffs into the lungs every 4 (four) hours as needed for wheezing or shortness of breath., Disp: 1 Inhaler, Rfl: 2 .  atorvastatin (LIPITOR) 80 MG tablet, Take 80 mg by mouth at bedtime. , Disp: , Rfl:  .  budesonide-formoterol (SYMBICORT) 160-4.5 MCG/ACT inhaler, Inhale 2 puffs into the lungs 2 (two) times daily., Disp: 1 Inhaler, Rfl: 11 .  busPIRone (BUSPAR) 10 MG tablet, Take 10 mg by mouth 2 (two) times daily., Disp: , Rfl:  .  clindamycin (CLEOCIN) 150 MG capsule, 2 tabs tid, Disp: 42 capsule, Rfl: 0 .  DULoxetine (CYMBALTA) 60 MG capsule, Take 120 mg by mouth daily., Disp: , Rfl:  .  empagliflozin (JARDIANCE) 25 MG TABS tablet, Take 25 mg by mouth daily., Disp: , Rfl:  .  ezetimibe (ZETIA) 10 MG tablet, Take 10 mg by mouth daily. , Disp: , Rfl:  .  fluticasone (FLONASE) 50 MCG/ACT nasal spray, Place 2 sprays into both nostrils daily., Disp: 16 g, Rfl: 6 .  gabapentin (NEURONTIN) 300 MG capsule, Take 1 capsule (300 mg total) by mouth 2 (two) times daily., Disp: 180 capsule, Rfl: 0 .  HUMULIN R U-500 KWIKPEN 500 UNIT/ML kwikpen, Inject 70 Units as directed 3 (three) times daily. , Disp: , Rfl:  .  hydrOXYzine (VISTARIL) 50 MG capsule, Take 50-100 mg by mouth daily as needed for anxiety. , Disp: , Rfl:  .  insulin aspart (NOVOLOG) 100 UNIT/ML injection, Inject 5-25 Units into the skin 3 (three) times daily before meals. , Disp: , Rfl:  .  lamoTRIgine (LAMICTAL) 200 MG tablet, Take 200 mg by mouth 2 (two) times daily. , Disp: , Rfl:  .  levothyroxine (SYNTHROID) 50 MCG tablet, 1 TAB daily three days a week,  1.5 TABS four days a week (Mon,  Wed, Fri, Sat for example), Disp: 108 tablet, Rfl: 1 .  lisinopril (PRINIVIL,ZESTRIL) 2.5 MG tablet, Take 1 tablet (2.5 mg total) by mouth daily., Disp: 90 tablet, Rfl: 1 .  metoprolol tartrate (LOPRESSOR) 25 MG tablet, Take one tablet 25 mg in the am and 1/2 tab in the pm, Disp: 135 tablet, Rfl: 0 .  mupirocin ointment (BACTROBAN) 2 %, Place 1 application into the nose 2 (two) times daily., Disp: 22 g, Rfl: 0 .  Na Sulfate-K Sulfate-Mg Sulf (SUPREP BOWEL PREP KIT) 17.5-3.13-1.6 GM/177ML SOLN, Take 1 kit by mouth as directed. (Patient not taking: Reported on 02/20/2019), Disp: 1 Bottle, Rfl: 0 .  Omega-3 Fatty Acids (FISH OIL PO), Take by mouth daily., Disp: , Rfl:  .  ondansetron (ZOFRAN) 4 MG tablet, Take 1 tablet (4 mg total) by mouth every 8 (eight) hours as needed for nausea or vomiting., Disp: 20 tablet, Rfl: 2 .  pantoprazole (PROTONIX) 40 MG tablet, Take 1 tablet (40 mg total) by mouth daily., Disp: 90 tablet,  Rfl: 3 .  QUEtiapine (SEROQUEL) 400 MG tablet, Take 400 mg by mouth at bedtime., Disp: , Rfl:  .  traZODone (DESYREL) 100 MG tablet, Take 100 mg by mouth at bedtime., Disp: , Rfl:  .  TRULICITY 1.5 PP/9.66FE SOPN, Inject 1.5 mLs as directed once a week. , Disp: , Rfl:  .  ULTICARE SHORT PEN NEEDLES 31G X 8 MM MISC, , Disp: , Rfl:   Allergies  Allergen Reactions  . Mirtazapine Anaphylaxis    REACTION: closes throat  . Morphine And Related     anaphylaxis  . Sulfamethoxazole-Trimethoprim Rash    REACTION: Whelps all over  . Amoxicillin Er Nausea And Vomiting    Has patient had a PCN reaction causing immediate rash, facial/tongue/throat swelling, SOB or lightheadedness with hypotension: No Has patient had a PCN reaction causing severe rash involving mucus membranes or skin necrosis: No Has patient had a PCN reaction that required hospitalization: No Has patient had a PCN reaction occurring within the last 10 years: Yes If all of the above answers are "NO", then may proceed with  Cephalosporin use.  . Codeine Itching    REACTION: itch, rash  . Hydrocodone-Acetaminophen Rash    REACTION: rash  . Keflex [Cephalexin] Rash  . Prasterone Nausea And Vomiting    I personally reviewed active problem list, medication list, allergies, notes from last encounter, lab results with the patient/caregiver today.  ROS  Ten systems reviewed and is negative except as mentioned in HPI  Objective  Virtual encounter, vitals not obtained.  There is no height or weight on file to calculate BMI.  Nursing Note and Vital Signs reviewed.  Physical Exam  Pulmonary/Chest: Effort normal. No respiratory distress. Speaking in complete sentences Neurological: Pt is alert and oriented to person, place, and time. Speech is normal. Psychiatric: Patient has a normal mood and affect. behavior is normal. Judgment and thought content normal. MSK: UTA due to telephone visit.   No results found for this or any previous visit (from the past 72 hour(s)).  Assessment & Plan  1. Paresthesia of both hands - My examination is extremely limited as patient is unable to utilize video technology and our visit is telephonic today.  DDX neuropathy, bilateral carpal tunnel.  Will send to hand specialty for eval per her request. - Ambulatory referral to Hand Surgery - gabapentin (NEURONTIN) 300 MG capsule; Take 1 tablet each morning and 2 tablets at bedtime.  Dispense: 270 capsule; Refill: 1  2. Type 2 diabetes, uncontrolled, with neuropathy (HCC) - gabapentin (NEURONTIN) 300 MG capsule; Take 1 tablet each morning and 2 tablets at bedtime.  Dispense: 270 capsule; Refill: 1  3. Restless leg syndrome - gabapentin (NEURONTIN) 300 MG capsule; Take 1 tablet each morning and 2 tablets at bedtime.  Dispense: 270 capsule; Refill: 1   -Red flags and when to present for emergency care or RTC including fever >101.66F, chest pain, shortness of breath, new/worsening/un-resolving symptoms,  reviewed with patient at  time of visit. Follow up and care instructions discussed and provided in AVS. - I discussed the assessment and treatment plan with the patient. The patient was provided an opportunity to ask questions and all were answered. The patient agreed with the plan and demonstrated an understanding of the instructions.  - The patient was advised to call back or seek an in-person evaluation if the symptoms worsen or if the condition fails to improve as anticipated.  I provided 13 minutes of non-face-to-face time during this encounter.  Hubbard Hartshorn, FNP

## 2019-04-15 ENCOUNTER — Emergency Department: Payer: Medicare HMO

## 2019-04-15 ENCOUNTER — Encounter: Payer: Self-pay | Admitting: Emergency Medicine

## 2019-04-15 ENCOUNTER — Other Ambulatory Visit: Payer: Self-pay

## 2019-04-15 ENCOUNTER — Inpatient Hospital Stay
Admission: EM | Admit: 2019-04-15 | Discharge: 2019-04-17 | DRG: 580 | Disposition: A | Discharge status: Home / Self Care | Payer: Medicare HMO | Attending: Surgery | Admitting: Surgery

## 2019-04-15 DIAGNOSIS — J449 Chronic obstructive pulmonary disease, unspecified: Secondary | ICD-10-CM | POA: Diagnosis present

## 2019-04-15 DIAGNOSIS — K219 Gastro-esophageal reflux disease without esophagitis: Secondary | ICD-10-CM | POA: Diagnosis present

## 2019-04-15 DIAGNOSIS — F419 Anxiety disorder, unspecified: Secondary | ICD-10-CM | POA: Diagnosis present

## 2019-04-15 DIAGNOSIS — Z818 Family history of other mental and behavioral disorders: Secondary | ICD-10-CM

## 2019-04-15 DIAGNOSIS — Z794 Long term (current) use of insulin: Secondary | ICD-10-CM

## 2019-04-15 DIAGNOSIS — Z79899 Other long term (current) drug therapy: Secondary | ICD-10-CM

## 2019-04-15 DIAGNOSIS — D899 Disorder involving the immune mechanism, unspecified: Secondary | ICD-10-CM | POA: Diagnosis present

## 2019-04-15 DIAGNOSIS — Z6838 Body mass index (BMI) 38.0-38.9, adult: Secondary | ICD-10-CM

## 2019-04-15 DIAGNOSIS — Z825 Family history of asthma and other chronic lower respiratory diseases: Secondary | ICD-10-CM

## 2019-04-15 DIAGNOSIS — N183 Chronic kidney disease, stage 3 (moderate): Secondary | ICD-10-CM | POA: Diagnosis present

## 2019-04-15 DIAGNOSIS — G2581 Restless legs syndrome: Secondary | ICD-10-CM | POA: Diagnosis present

## 2019-04-15 DIAGNOSIS — Z881 Allergy status to other antibiotic agents status: Secondary | ICD-10-CM

## 2019-04-15 DIAGNOSIS — Z888 Allergy status to other drugs, medicaments and biological substances status: Secondary | ICD-10-CM

## 2019-04-15 DIAGNOSIS — I252 Old myocardial infarction: Secondary | ICD-10-CM

## 2019-04-15 DIAGNOSIS — M1990 Unspecified osteoarthritis, unspecified site: Secondary | ICD-10-CM | POA: Diagnosis present

## 2019-04-15 DIAGNOSIS — L02411 Cutaneous abscess of right axilla: Secondary | ICD-10-CM | POA: Diagnosis not present

## 2019-04-15 DIAGNOSIS — Z841 Family history of disorders of kidney and ureter: Secondary | ICD-10-CM

## 2019-04-15 DIAGNOSIS — I130 Hypertensive heart and chronic kidney disease with heart failure and stage 1 through stage 4 chronic kidney disease, or unspecified chronic kidney disease: Secondary | ICD-10-CM | POA: Diagnosis present

## 2019-04-15 DIAGNOSIS — E039 Hypothyroidism, unspecified: Secondary | ICD-10-CM | POA: Diagnosis present

## 2019-04-15 DIAGNOSIS — Z8249 Family history of ischemic heart disease and other diseases of the circulatory system: Secondary | ICD-10-CM

## 2019-04-15 DIAGNOSIS — E1151 Type 2 diabetes mellitus with diabetic peripheral angiopathy without gangrene: Secondary | ICD-10-CM | POA: Diagnosis present

## 2019-04-15 DIAGNOSIS — Z20828 Contact with and (suspected) exposure to other viral communicable diseases: Secondary | ICD-10-CM | POA: Diagnosis present

## 2019-04-15 DIAGNOSIS — N61 Mastitis without abscess: Secondary | ICD-10-CM | POA: Diagnosis present

## 2019-04-15 DIAGNOSIS — Z885 Allergy status to narcotic agent status: Secondary | ICD-10-CM

## 2019-04-15 DIAGNOSIS — K760 Fatty (change of) liver, not elsewhere classified: Secondary | ICD-10-CM | POA: Diagnosis present

## 2019-04-15 DIAGNOSIS — L03818 Cellulitis of other sites: Secondary | ICD-10-CM

## 2019-04-15 DIAGNOSIS — Z91048 Other nonmedicinal substance allergy status: Secondary | ICD-10-CM

## 2019-04-15 DIAGNOSIS — I509 Heart failure, unspecified: Secondary | ICD-10-CM | POA: Clinically undetermined

## 2019-04-15 DIAGNOSIS — Z882 Allergy status to sulfonamides status: Secondary | ICD-10-CM

## 2019-04-15 DIAGNOSIS — I2510 Atherosclerotic heart disease of native coronary artery without angina pectoris: Secondary | ICD-10-CM | POA: Diagnosis present

## 2019-04-15 DIAGNOSIS — F319 Bipolar disorder, unspecified: Secondary | ICD-10-CM | POA: Diagnosis present

## 2019-04-15 DIAGNOSIS — Z7951 Long term (current) use of inhaled steroids: Secondary | ICD-10-CM

## 2019-04-15 DIAGNOSIS — E785 Hyperlipidemia, unspecified: Secondary | ICD-10-CM | POA: Diagnosis present

## 2019-04-15 DIAGNOSIS — E662 Morbid (severe) obesity with alveolar hypoventilation: Secondary | ICD-10-CM | POA: Diagnosis present

## 2019-04-15 LAB — COMPREHENSIVE METABOLIC PANEL
ALT: 24 U/L (ref 0–44)
AST: 27 U/L (ref 15–41)
Albumin: 3.9 g/dL (ref 3.5–5.0)
Alkaline Phosphatase: 55 U/L (ref 38–126)
Anion gap: 11 (ref 5–15)
BUN: 25 mg/dL — ABNORMAL HIGH (ref 6–20)
CO2: 25 mmol/L (ref 22–32)
Calcium: 10.1 mg/dL (ref 8.9–10.3)
Chloride: 99 mmol/L (ref 98–111)
Creatinine, Ser: 1.07 mg/dL — ABNORMAL HIGH (ref 0.44–1.00)
GFR calc Af Amer: >60 mL/min (ref >60)
GFR calc non Af Amer: 58 mL/min — ABNORMAL LOW (ref >60)
Glucose, Bld: 225 mg/dL — ABNORMAL HIGH (ref 70–99)
Potassium: 3.9 mmol/L (ref 3.5–5.1)
Sodium: 135 mmol/L (ref 135–145)
Total Bilirubin: 0.8 mg/dL (ref 0.3–1.2)
Total Protein: 7 g/dL (ref 6.5–8.1)

## 2019-04-15 LAB — URINALYSIS, COMPLETE (UACMP) WITH MICROSCOPIC
Bacteria, UA: NONE SEEN
Bilirubin Urine: NEGATIVE
Glucose, UA: >=500 mg/dL — AB
Hgb urine dipstick: NEGATIVE
Ketones, ur: 5 mg/dL — AB
Leukocytes,Ua: NEGATIVE
Nitrite: NEGATIVE
Protein, ur: NEGATIVE mg/dL
Specific Gravity, Urine: 1.028 (ref 1.005–1.030)
pH: 5 (ref 5.0–8.0)

## 2019-04-15 LAB — CBC WITH DIFFERENTIAL/PLATELET
Abs Immature Granulocytes: 0.2 10*3/uL — ABNORMAL HIGH (ref 0.00–0.07)
Basophils Absolute: 0 10*3/uL (ref 0.0–0.1)
Basophils Relative: 0 %
Eosinophils Absolute: 0.2 10*3/uL (ref 0.0–0.5)
Eosinophils Relative: 1 %
HCT: 35.5 % — ABNORMAL LOW (ref 36.0–46.0)
Hemoglobin: 11.1 g/dL — ABNORMAL LOW (ref 12.0–15.0)
Immature Granulocytes: 2 %
Lymphocytes Relative: 15 %
Lymphs Abs: 2 10*3/uL (ref 0.7–4.0)
MCH: 28.5 pg (ref 26.0–34.0)
MCHC: 31.3 g/dL (ref 30.0–36.0)
MCV: 91.3 fL (ref 80.0–100.0)
Monocytes Absolute: 1.2 10*3/uL — ABNORMAL HIGH (ref 0.1–1.0)
Monocytes Relative: 9 %
Neutro Abs: 9.6 10*3/uL — ABNORMAL HIGH (ref 1.7–7.7)
Neutrophils Relative %: 73 %
Platelets: 246 10*3/uL (ref 150–400)
RBC: 3.89 MIL/uL (ref 3.87–5.11)
RDW: 14.2 % (ref 11.5–15.5)
WBC: 13.2 10*3/uL — ABNORMAL HIGH (ref 4.0–10.5)
nRBC: 0 % (ref 0.0–0.2)

## 2019-04-15 LAB — SARS CORONAVIRUS 2 BY RT PCR (HOSPITAL ORDER, PERFORMED IN ~~LOC~~ HOSPITAL LAB): SARS Coronavirus 2: NEGATIVE

## 2019-04-15 LAB — PROTIME-INR
INR: 1 (ref 0.8–1.2)
Prothrombin Time: 13.2 seconds (ref 11.4–15.2)

## 2019-04-15 LAB — LACTIC ACID, PLASMA
Lactic Acid, Venous: 1 mmol/L (ref 0.5–1.9)
Lactic Acid, Venous: 1 mmol/L (ref 0.5–1.9)

## 2019-04-15 MED ORDER — SODIUM CHLORIDE 0.9 % IV SOLN
2.0000 g | Freq: Once | INTRAVENOUS | Status: AC
  Administered 2019-04-16: 03:00:00 2 g via INTRAVENOUS
  Filled 2019-04-15: qty 2

## 2019-04-15 MED ORDER — VANCOMYCIN HCL 1.5 G IV SOLR
1500.0000 mg | Freq: Once | INTRAVENOUS | Status: DC
  Filled 2019-04-15: qty 1500

## 2019-04-15 MED ORDER — ONDANSETRON HCL 4 MG/2ML IJ SOLN
4.0000 mg | Freq: Once | INTRAMUSCULAR | Status: AC
  Administered 2019-04-15: 22:00:00 4 mg via INTRAVENOUS
  Filled 2019-04-15: qty 2

## 2019-04-15 MED ORDER — VANCOMYCIN HCL 10 G IV SOLR
1500.0000 mg | Freq: Once | INTRAVENOUS | Status: DC
  Filled 2019-04-15 (×2): qty 1500

## 2019-04-15 MED ORDER — CLINDAMYCIN PHOSPHATE 600 MG/50ML IV SOLN
600.0000 mg | Freq: Once | INTRAVENOUS | Status: AC
  Administered 2019-04-16: 04:00:00 600 mg via INTRAVENOUS
  Filled 2019-04-15: qty 50

## 2019-04-15 MED ORDER — VANCOMYCIN HCL IN DEXTROSE 1-5 GM/200ML-% IV SOLN
1000.0000 mg | Freq: Once | INTRAVENOUS | Status: DC
  Administered 2019-04-15: 1000 mg via INTRAVENOUS
  Filled 2019-04-15: qty 200

## 2019-04-15 MED ORDER — FENTANYL CITRATE (PF) 100 MCG/2ML IJ SOLN
12.5000 ug | Freq: Once | INTRAMUSCULAR | Status: AC
  Administered 2019-04-15: 22:00:00 12.5 ug via INTRAVENOUS
  Filled 2019-04-15: qty 2

## 2019-04-15 MED ORDER — SODIUM CHLORIDE 0.9 % IV SOLN: 1.0000 g | Freq: Three times a day (TID) | INTRAVENOUS | Status: DC

## 2019-04-15 MED ORDER — VANCOMYCIN HCL 500 MG IV SOLR
500.0000 mg | Freq: Once | INTRAVENOUS | Status: AC
  Administered 2019-04-16: 500 mg via INTRAVENOUS
  Filled 2019-04-15: qty 500

## 2019-04-15 MED ORDER — SODIUM CHLORIDE 0.9 % IV SOLN: 1.0000 g | Freq: Once | INTRAVENOUS | Status: DC

## 2019-04-15 NOTE — ED Triage Notes (Signed)
Patient reports abscess under right arm x4 days. Redness extends from under arm to right breast. Patient is immunocompromised and immunologist started her on amoxicillin 2 days ago to try to prevent infection from spreading. Patient reports white drainage from area. Febrile in triage. Also complaining of nausea.

## 2019-04-15 NOTE — ED Provider Notes (Signed)
[facility] Emergency Department Provider Note   ____________________________________________   First MD Initiated Contact with Patient 04/15/19 2036     (approximate)  I have reviewed the triage vital signs and the nursing notes.   HISTORY  Chief Complaint Abscess    HPI [name] is a 53 y.o. female with 4 days of increasing pain and redness under the arm some drainage started now.  Patient says that she has been told she is immune compromised and immunology started her on amoxicillin 2 days ago to keep the infections from spreading but it is not.  She has 101.2 fever when she gets here.  White count is elevated as well.  Complaining of nausea and the erythema has progressed from the 4 x 6 cm area under the arm down onto the upper outer quadrant of the right breast.         Past Medical History:  Diagnosis Date  . Anxiety   . Arthritis   . Asthma   . Bipolar depression ([place])   . CHF (congestive heart failure) ([place])   . Chronic headaches    migraines have lessened  . Chronic kidney disease    ddstage 111  . COPD (chronic obstructive pulmonary disease) ([place])   . Coronary artery disease   . Depression   . Diabetic neuropathy ([place])   . Dyspnea    uses inhalers for this  . Dysrhythmia    rapid  . Fatty liver   . GERD (gastroesophageal reflux disease)   . Headache   . Heart attack ([place]) 2009  . Hyperlipidemia   . Hypertension   . Hypomagnesemia   . Hypothyroid   . Irregular heart rhythm   . Liver lesion    Favored to be benign on CT; MRI of abd w/contract in Aug 2016. Liver Biopsy-Negative, March 2016  . Morbid obesity ([place])   . OSA (obstructive sleep apnea)   . Peripheral vascular disease ([place])   . Pinched nerve    Back  . Pneumonia   . RLS (restless legs syndrome)   . Seasonal allergies   . Sleep apnea    uses oxygen at night 2L d/t cpap did not fit  . Type 2 diabetes, uncontrolled, with neuropathy ([place]) 10/15/2015   Overview:  Microalbumin 7.7 12/2010: Hgb A1c 10.2% 12/2010 and 9.7% 03/2011, eye exam 10/2009 [facility] DM clinic   . Urinary incontinence   . Vitamin D deficiency     Patient Active Problem List   Diagnosis Date Noted  . Iron deficiency anemia due to chronic blood loss   . Polyp of ascending colon   . Chronic gastric ulcer with hemorrhage   . Type 2 diabetes with decreased circulation ([place]) 11/07/2018  . Vitamin B12 deficiency 08/08/2018  . Encounter for Medicare annual wellness exam 10/30/2017  . Patient is full code 10/30/2017  . Disorder of bursae of shoulder region 10/09/2017  . Coronary artery disease 10/09/2017  . Plantar fascial fibromatosis 10/09/2017  . Tendinitis of wrist 10/09/2017  . Schizoaffective disorder, bipolar type ([place]) 09/28/2017  . Chest pain 09/10/2017  . Pleuritic chest pain 05/27/2017  . Hypertriglyceridemia 12/21/2016  . Pansinusitis 11/15/2016  . 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 Breast cancer screening 09/28/2016  . Urine frequency 09/28/2016  . Preventative health care 09/28/2016  . Fatigue 05/01/2016  . Urinary tract infection 03/14/2016  . Adenomatous colon polyp 10/15/2015  . Airway hyperreactivity 10/15/2015  . Bipolar affective disorder (  [place]) 10/15/2015  . CN (constipation) 10/15/2015  . CAFL (chronic airflow limitation) ([place]) 10/15/2015  . Drug-induced hepatic toxicity 10/15/2015  . Blood in feces 10/15/2015  . HLD (hyperlipidemia) 10/15/2015  . Hypoxemia 10/15/2015  . NASH (nonalcoholic steatohepatitis) 10/15/2015  . Morbid obesity ([place]) 10/15/2015  . Restless leg syndrome 10/15/2015  . Type 2 diabetes, uncontrolled, with neuropathy ([place]) 10/15/2015  . Elevated alkaline phosphatase level 09/22/2015  . Urinary bladder incontinence 09/22/2015  . Low back pain with left-sided sciatica 08/18/2015  . Encounter for monitoring tricyclic antidepressant therapy 05/13/2015  . Bipolar disorder ([place]) 05/11/2015  . Liver disease  05/11/2015  . Apnea, sleep 05/05/2015  . Heart valve disease 05/05/2015  . Chronic kidney disease, stage III (moderate) (HCC) 03/15/2015  . Migraine 03/12/2015  . COPD (chronic obstructive pulmonary disease) ([place]) 01/20/2015  . 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 GERD (gastroesophageal reflux disease) 01/20/2015  . Hypothyroid   . Vitamin D deficiency   . Urinary incontinence   . Liver lesion   . Hypomagnesemia   . Obstructive sleep apnea 10/20/2013  . Vaginitis 12/19/2011  . PERIPHERAL NEUROPATHY 03/04/2009  . HEMORRHOIDS, EXTERNAL 09/03/2008  . ALLERGIC RHINITIS 07/04/2007  . ASTHMA, PERSISTENT, MODERATE 07/04/2007  . INTERSTITIAL CYSTITIS 07/04/2007  . HYPERTENSION, BENIGN ESSENTIAL 06/19/2007    Past Surgical History:  Procedure Laterality Date  . ABDOMINAL HYSTERECTOMY    . ABDOMINAL HYSTERECTOMY     Partial  . bladder stim  2007  . BLADDER SURGERY     x 2. tacking of bladder and the other was the stimulator placement  . CHOLECYSTECTOMY    . COLONOSCOPY    . COLONOSCOPY WITH PROPOFOL N/A 03/04/2019   Procedure: COLONOSCOPY WITH PROPOFOL;  Surgeon: [name], MD;  Location[facility] ENDOSCOPY;  Service: Endoscopy;  Laterality: N/A;  . ESOPHAGOGASTRODUODENOSCOPY (EGD) WITH PROPOFOL N/A 03/04/2019   Procedure: ESOPHAGOGASTRODUODENOSCOPY (EGD) WITH PROPOFOL;  Surgeon: [name], MD;  Location: [facility] ENDOSCOPY;  Service: Endoscopy;  Laterality: N/A;  . INTERSTIM-GENERATOR CHANGE N/A 12/31/2017   Procedure: INTERSTIM-GENERATOR CHANGE;  Surgeon: Bjorn Loser, MD;  Location: [facility] ORS;  Service: Urology;  Laterality: N/A;  . LIVER BIOPSY  March 2016   Negative  . LUNG BIOPSY  2015   small spot on lung  . TUBAL LIGATION    . UPPER GI ENDOSCOPY      Prior to Admission medications   Medication Sig Start Date End Date Taking? Authorizing Provider  albuterol (PROVENTIL HFA;VENTOLIN HFA) 108 (90 Base) MCG/ACT inhaler Inhale 2 puffs into the lungs every 4 (four)  hours as needed for wheezing or shortness of breath. 10/04/18   Arnetha Courser, MD  atorvastatin (LIPITOR) 80 MG tablet Take 80 mg by mouth at bedtime.     [provider]  budesonide-formoterol (SYMBICORT) 160-4.5 MCG/ACT inhaler Inhale 2 puffs into the lungs 2 (two) times daily. 10/04/18   Lada, Satira Anis, MD  busPIRone (BUSPAR) 10 MG tablet Take 10 mg by mouth 2 (two) times daily.    [provider]  clindamycin (CLEOCIN) 150 MG capsule 2 tabs tid 02/16/19   Johnn Hai, PA-C  DULoxetine (CYMBALTA) 60 MG capsule Take 120 mg by mouth daily.    [provider]  empagliflozin (JARDIANCE) 25 MG TABS tablet Take 25 mg by mouth daily.    [provider]  ezetimibe (ZETIA) 10 MG tablet Take 10 mg by mouth daily.  01/05/17   [provider]  fluticasone (FLONASE) 50 MCG/ACT nasal spray Place  2 sprays into both nostrils daily. 04/16/18   Arnetha Courser, MD  gabapentin (NEURONTIN) 300 MG capsule Take 1 tablet each morning and 2 tablets at bedtime. 04/09/19   Hubbard Hartshorn, FNP  HUMULIN R U-500 KWIKPEN 500 UNIT/ML kwikpen Inject 70 Units as directed 3 (three) times daily.     [provider]  hydrOXYzine (VISTARIL) 50 MG capsule Take 50-100 mg by mouth daily as needed for anxiety.     [provider]  insulin aspart (NOVOLOG) 100 UNIT/ML injection Inject 5-25 Units into the skin 3 (three) times daily before meals.     [provider]  lamoTRIgine (LAMICTAL) 200 MG tablet Take 200 mg by mouth 2 (two) times daily.     [provider]  levothyroxine (SYNTHROID) 50 MCG tablet 1 TAB daily three days a week,  1.5 TABS four days a week (Mon, Wed, Fri, Sat for example) 02/25/19   Poulose, Bethel Born, NP  lisinopril (PRINIVIL,ZESTRIL) 2.5 MG tablet Take 1 tablet (2.5 mg total) by mouth daily. 12/17/18   Arnetha Courser, MD  metoprolol tartrate (LOPRESSOR) 25 MG tablet Take one tablet 25 mg in the am and 1/2 tab in the pm 02/20/19    Poulose, Bethel Born, NP  mupirocin ointment (BACTROBAN) 2 % Place 1 application into the nose 2 (two) times daily. 11/22/18   Hubbard Hartshorn, FNP  Na Sulfate-K Sulfate-Mg Sulf (SUPREP BOWEL PREP KIT) 17.5-3.13-1.6 GM/177ML SOLN Take 1 kit by mouth as directed. Patient not taking: Reported on 02/20/2019 02/05/19   [name], MD  Omega-3 Fatty Acids (FISH OIL PO) Take by mouth daily.    [provider]  ondansetron (ZOFRAN) 4 MG tablet Take 1 tablet (4 mg total) by mouth every 8 (eight) hours as needed for nausea or vomiting. 02/05/19   [name], MD  pantoprazole (PROTONIX) 40 MG tablet Take 1 tablet (40 mg total) by mouth daily. 11/06/18   [name], MD  QUEtiapine (SEROQUEL) 400 MG tablet Take 400 mg by mouth at bedtime.    [provider]  traZODone (DESYREL) 100 MG tablet Take 100 mg by mouth at bedtime.    [provider]  TRULICITY 1.5 BD/5.3GD SOPN Inject 1.5 mLs as directed once a week.  03/26/18   [provider]  ULTICARE SHORT PEN NEEDLES 31G X 8 MM [place]  07/31/18   [provider]    Allergies Mirtazapine, Morphine and related, Sulfamethoxazole-trimethoprim, Amoxicillin er, Codeine, Hydrocodone-acetaminophen, Keflex [cephalexin], and Prasterone  Family History  Problem Relation Age of Onset  . Diabetes Mother   . Heart disease Mother   . Hyperlipidemia Mother   . Hypertension Mother   . Kidney disease Mother   . Mental illness Mother   . Hypothyroidism Mother   . Stroke Mother   . Osteoporosis Mother   . Glaucoma Mother   . Congestive Heart Failure Mother   . Hypertension Father   . Asthma Daughter   . Cancer Daughter        throat  . Bipolar disorder Daughter   . Heart disease Maternal Grandfather   . Rheum arthritis Maternal Grandfather   . Cancer Maternal Grandfather        liver  . Kidney disease Maternal Grandfather   . Cancer Paternal Grandmother        lung  . Kidney disease Brother   . Breast cancer Maternal  Grandmother 60  . Bipolar disorder Daughter   . Hypertension Daughter   .  Restless legs syndrome Daughter     Social History Social History   Tobacco Use  . Smoking status: Never Smoker  . Smokeless tobacco: Never Used  Substance Use Topics  . Alcohol use: No  . Drug use: No    Review of Systems  Constitutional:  fever/chills Eyes: No visual changes. ENT: No sore throat. Cardiovascular: See HPI her chest pain is related to her abscess in the right axilla Respiratory: Denies shortness of breath. Gastrointestinal: No abdominal pain.   nausea, no vomiting.  No diarrhea.  No constipation. Genitourinary: Negative for dysuria. Musculoskeletal: Negative for back pain. Skin: Negative for rash. Neurological: Negative for  focal weakness   ____________________________________________   PHYSICAL EXAM:  VITAL SIGNS: ED Triage Vitals  Enc Vitals Group     BP 04/15/19 1822 (!) 147/86     Pulse Rate 04/15/19 1822 (!) 107     Resp 04/15/19 1822 20     Temp 04/15/19 1822 (!) 101.2 F (38.4 C)     Temp Source 04/15/19 1822 Oral     SpO2 04/15/19 1822 99 %     Weight 04/15/19 1823 190 lb (86.2 kg)     Height 04/15/19 1823 4' 11"  (1.499 m)     Head Circumference --      Peak Flow --      Pain Score 04/15/19 1827 9     Pain Loc --      Pain Edu? --      Excl. in [place]? --     Constitutional: Alert and oriented. Well appearing and in no acute distress. Eyes: Conjunctivae are normal.  Head: Atraumatic. Nose: No congestion/rhinnorhea. Mouth/Throat: Mucous membranes are moist.  Oropharynx non-erythematous. Neck: No stridor.   Cardiovascular: Slightly rapid rate, regular rhythm. Grossly normal heart sounds.  Good peripheral circulation. Respiratory: Normal respiratory effort.  No retractions. Lungs CTAB. Gastrointestinal: Soft and nontender. No distention. No abdominal bruits. No CVA tenderness. Musculoskeletal: No lower extremity tenderness nor edema.   Neurologic:  Normal speech  and language. No gross focal neurologic deficits are appreciated.  Skin:  Skin is warm, dry and intact. No rash noted.  ____________________________________________   LABS (all labs ordered are listed, but only abnormal results are displayed)  Labs Reviewed  COMPREHENSIVE METABOLIC PANEL - Abnormal; Notable for the following components:      Result Value   Glucose, Bld 225 (*)    BUN 25 (*)    Creatinine, Ser 1.07 (*)    GFR calc non Af Amer 58 (*)    All other components within normal limits  CBC WITH DIFFERENTIAL/PLATELET - Abnormal; Notable for the following components:   WBC 13.2 (*)    Hemoglobin 11.1 (*)    HCT 35.5 (*)    Neutro Abs 9.6 (*)    Monocytes Absolute 1.2 (*)    Abs Immature Granulocytes 0.20 (*)    All other components within normal limits  URINALYSIS, COMPLETE (UACMP) WITH MICROSCOPIC - Abnormal; Notable for the following components:   Color, Urine STRAW (*)    APPearance CLEAR (*)    Glucose, UA >=500 (*)    Ketones, ur 5 (*)    All other components within normal limits  CULTURE, BLOOD (ROUTINE X 2)  CULTURE, BLOOD (ROUTINE X 2)  LACTIC ACID, PLASMA  LACTIC ACID, PLASMA  PROTIME-INR   ____________________________________________  EKG   ____________________________________________  RADIOLOGY  ED MD interpretation: X-ray read by radiology reviewed by me is negative  Official radiology report(s): Dg Chest  2 View  Result Date: 04/15/2019 CLINICAL DATA:  Axillary abscess EXAM: CHEST - 2 VIEW COMPARISON:  01/03/2019 FINDINGS: The heart size and mediastinal contours are within normal limits. Both lungs are clear. The visualized skeletal structures are unremarkable. IMPRESSION: No active cardiopulmonary disease. Electronically Signed   By: Inez Catalina M.D.   On: 04/15/2019 20:46    ____________________________________________   PROCEDURES  Procedure(s) performed (including Critical Care):  Procedures    ____________________________________________   INITIAL IMPRESSION / ASSESSMENT AND PLAN / ED COURSE  Ultrasound shows what appears to be a multiloculated abscess under the axilla with several points of drainage.  It is very tender additionally there is surrounding erythema as described above.  Patient actually does meet sepsis criteria when she comes in.  We will give her IV antibiotics and some fluids a little bit of pain medicine and I discussed this patient with Dr. Lysle Pearl the surgeon who will come in and evaluate the patient for possible operating room I&D.  I expect we will have to get her in the hospital for further IV antibiotics as the cellulitis has spread onto the right breast as I mentioned in HPI.              ____________________________________________   FINAL CLINICAL IMPRESSION(S) / ED DIAGNOSES  Final diagnoses:  Abscess of axilla, right  Cellulitis of other specified site     ED Discharge Orders    None       Note:  This document was prepared using Dragon voice recognition software and may include unintentional dictation errors.    Nena Polio, MD 04/15/19 2144

## 2019-04-15 NOTE — H&P (Addendum)
Subjective:   CC: Right axillary abscess  HPI:  [name] is a 53 y.o. female who was consulted by [facility] for evaluation of above.  First noted 4 days ago.  Symptoms include: Pain is sharp, worsening.  Exacerbated by palpation.  Alleviated by nothing specific.  Associated with surrounding erythema that is starting to extend into her right breast.  Never had issues like this before.   Past Medical History:  has a past medical history of Anxiety, Arthritis, Asthma, Bipolar depression ([place]), CHF (congestive heart failure) ([place]), Chronic headaches, Chronic kidney disease, COPD (chronic obstructive pulmonary disease) ([place]), Coronary artery disease, Depression, Diabetic neuropathy ([place]), Dyspnea, Dysrhythmia, Fatty liver, GERD (gastroesophageal reflux disease), Headache, Heart attack ([place]) (2009), Hyperlipidemia, Hypertension, Hypomagnesemia, Hypothyroid, Irregular heart rhythm, Liver lesion, Morbid obesity ([place]), OSA (obstructive sleep apnea), Peripheral vascular disease ([place]), Pinched nerve, Pneumonia, RLS (restless legs syndrome), Seasonal allergies, Sleep apnea, Type 2 diabetes, uncontrolled, with neuropathy ([place]) (10/15/2015), Urinary incontinence, and Vitamin D deficiency.  Past Surgical History:  has a past surgical history that includes Abdominal hysterectomy; Tubal ligation; Liver biopsy (March 2016); Abdominal hysterectomy; Cholecystectomy; Bladder surgery; Lung biopsy (2015); bladder stim (2007); Interstim-generator change (N/A, 12/31/2017); Colonoscopy; Upper gi endoscopy; Colonoscopy with propofol (N/A, 03/04/2019); and Esophagogastroduodenoscopy (egd) with propofol (N/A, 03/04/2019).  Family History: family history includes Asthma in her daughter; Bipolar disorder in her daughter and daughter; Breast cancer (age of onset: 64) in her maternal grandmother; Cancer in her daughter, maternal grandfather, and paternal grandmother; Congestive Heart Failure in her mother; Diabetes in her mother;  Glaucoma in her mother; Heart disease in her maternal grandfather and mother; Hyperlipidemia in her mother; Hypertension in her daughter, father, and mother; Hypothyroidism in her mother; Kidney disease in her brother, maternal grandfather, and mother; Mental illness in her mother; Osteoporosis in her mother; Restless legs syndrome in her daughter; Rheum arthritis in her maternal grandfather; Stroke in her mother.  Social History:  reports that she has never smoked. She has never used smokeless tobacco. She reports that she does not drink alcohol or use drugs.  Current Medications: (Not in a hospital admission)   Allergies:  Allergies  Allergen Reactions  . Mirtazapine Anaphylaxis    REACTION: closes throat  . Morphine And Related     anaphylaxis  . Sulfamethoxazole-Trimethoprim Rash    REACTION: [place] all over  . Amoxicillin Er Nausea And Vomiting    Has patient had a PCN reaction causing immediate rash, facial/tongue/throat swelling, SOB or lightheadedness with hypotension: No Has patient had a PCN reaction causing severe rash involving mucus membranes or skin necrosis: No Has patient had a PCN reaction that required hospitalization: No Has patient had a PCN reaction occurring within the last 10 years: Yes If all of the above answers are "NO", then may proceed with Cephalosporin use.  . Codeine Itching    REACTION: itch, rash  . Hydrocodone-Acetaminophen Rash    REACTION: rash  . Keflex [Cephalexin] Rash  . Prasterone Nausea And Vomiting    ROS:  General: Denies weight loss, weight gain, fatigue, fevers, chills, and night sweats. Eyes: Denies blurry vision, double vision, eye pain, itchy eyes, and tearing. Ears: Denies hearing loss, earache, and ringing in ears. Nose: Denies sinus pain, congestion, infections, runny nose, and nosebleeds. Mouth/throat: Denies hoarseness, sore throat, bleeding gums, and difficulty swallowing. Heart: Denies chest pain, palpitations, racing heart,  irregular heartbeat, leg pain or swelling, and decreased activity tolerance. Respiratory: Denies breathing difficulty, shortness of breath, wheezing, cough, and sputum. GI:  Denies change in appetite, heartburn, nausea, vomiting, constipation, diarrhea, and blood in stool. GU: Denies difficulty urinating, pain with urinating, urgency, frequency, blood in urine. Musculoskeletal: Denies joint stiffness, pain, swelling, muscle weakness. Skin: Denies rash, itching, mass, tumors,  Neurologic: Denies headache, fainting, dizziness, seizures, numbness, and tingling. Psychiatric: Denies depression, anxiety, difficulty sleeping, and memory loss. Endocrine: Denies heat or cold intolerance, and increased thirst or urination. Blood/lymph: Denies easy bruising, easy bruising, and swollen glands     Objective:     BP (!) 145/85   Pulse (!) 102   Temp 99.1 F (37.3 C) (Oral)   Resp 18   Ht 4' 11"  (1.499 m)   Wt 86.2 kg   SpO2 98%   BMI 38.38 kg/m   Constitutional :  alert, cooperative, appears stated age and no distress  Lymphatics/Throat:  no asymmetry, masses, or scars  Respiratory:  clear to auscultation bilaterally  Cardiovascular:  regular rate and rhythm  Gastrointestinal: soft, non-tender; bowel sounds normal; no masses,  no organomegaly.  Musculoskeletal: Steady gait and movement  Skin: Cool and moist, right axillary active abscess with active purulent drainage.  Area of tenderness and erythema measuring approximately 7 cm x 6-1/2 cm  Psychiatric: Normal affect, non-agitated, not confused       LABS:  CMP Latest Ref Rng & Units 04/15/2019 01/03/2019 11/08/2018  Glucose 70 - 99 mg/dL 225(H) 437(H) 323(H)  BUN 6 - 20 mg/dL 25(H) 37(H) 27(H)  Creatinine 0.44 - 1.00 mg/dL 1.07(H) 1.33(H) 1.12(H)  Sodium 135 - 145 mmol/L 135 134(L) 140  Potassium 3.5 - 5.1 mmol/L 3.9 4.7 5.1  Chloride 98 - 111 mmol/L 99 96(L) 105  CO2 22 - 32 mmol/L 25 25 28   Calcium 8.9 - 10.3 mg/dL 10.1 9.4 9.7  Total  Protein 6.5 - 8.1 g/dL 7.0 - -  Total Bilirubin 0.3 - 1.2 mg/dL 0.8 - -  Alkaline Phos 38 - 126 U/L 55 - -  AST 15 - 41 U/L 27 - -  ALT 0 - 44 U/L 24 - -   CBC Latest Ref Rng & Units 04/15/2019 01/03/2019 11/08/2018  WBC 4.0 - 10.5 K/uL 13.2(H) 8.4 8.2  Hemoglobin 12.0 - 15.0 g/dL 11.1(L) 12.1 10.8(L)  Hematocrit 36.0 - 46.0 % 35.5(L) 37.5 33.7(L)  Platelets 150 - 400 K/uL 246 282 327    RADS: n/a  Assessment:     Right axillary abscess Immunocompromised status Recent use of steroid use Morbid obesity  Plan:     1.  Recommend incision and drainage in the operating room due to the level of tenderness and size of abscess.  Alternatives include continued observation and antibiotics.  Benefits include possible symptom relief.  Discussed the risk of surgery including recurrence, chronic pain, post-op infxn, poor cosmesis, poor/delayed wound healing, and possible re-operation to address said risks. The risks of general anesthetic, if used, includes MI, CVA, sudden death or even reaction to anesthetic medications also discussed.  Typical post-op recovery time with possible activity restrictions were also discussed.  The patient verbalized understanding and all questions were answered to the patient's satisfaction.  Patient is agreeable to this incision and drainage in the operating room.  We discussed postoperative wound care including possibility of wound VAC placement as well.  Above listed comorbidities increase risk of perioperative complications including, delayed wound healing, recurrent infections.

## 2019-04-15 NOTE — ED Provider Notes (Signed)
Pharmacy texts.  She has had Unasyn and cephalosporins without problem in the past except for Keflex wants to use Mefoxin.  This sounds good to me.  Will fingers.   Nena Polio, MD 04/15/19 2219

## 2019-04-16 ENCOUNTER — Emergency Department: Payer: Medicare HMO | Admitting: Anesthesiology

## 2019-04-16 ENCOUNTER — Encounter: Payer: Self-pay | Admitting: Endocrinology

## 2019-04-16 ENCOUNTER — Telehealth: Payer: Self-pay | Admitting: Family Medicine

## 2019-04-16 ENCOUNTER — Encounter: Payer: Self-pay | Admitting: Anesthesiology

## 2019-04-16 ENCOUNTER — Encounter
Admission: EM | Disposition: A | Discharge status: Home / Self Care | Payer: Self-pay | Source: Home / Self Care | Attending: Surgery

## 2019-04-16 ENCOUNTER — Other Ambulatory Visit: Payer: Self-pay | Admitting: Family Medicine

## 2019-04-16 DIAGNOSIS — M1990 Unspecified osteoarthritis, unspecified site: Secondary | ICD-10-CM | POA: Diagnosis present

## 2019-04-16 DIAGNOSIS — I509 Heart failure, unspecified: Secondary | ICD-10-CM | POA: Diagnosis not present

## 2019-04-16 DIAGNOSIS — E785 Hyperlipidemia, unspecified: Secondary | ICD-10-CM | POA: Diagnosis present

## 2019-04-16 DIAGNOSIS — E662 Morbid (severe) obesity with alveolar hypoventilation: Secondary | ICD-10-CM | POA: Diagnosis present

## 2019-04-16 DIAGNOSIS — E039 Hypothyroidism, unspecified: Secondary | ICD-10-CM | POA: Diagnosis present

## 2019-04-16 DIAGNOSIS — I252 Old myocardial infarction: Secondary | ICD-10-CM | POA: Diagnosis not present

## 2019-04-16 DIAGNOSIS — F319 Bipolar disorder, unspecified: Secondary | ICD-10-CM | POA: Diagnosis present

## 2019-04-16 DIAGNOSIS — I2510 Atherosclerotic heart disease of native coronary artery without angina pectoris: Secondary | ICD-10-CM | POA: Diagnosis present

## 2019-04-16 DIAGNOSIS — G2581 Restless legs syndrome: Secondary | ICD-10-CM | POA: Diagnosis present

## 2019-04-16 DIAGNOSIS — Z20828 Contact with and (suspected) exposure to other viral communicable diseases: Secondary | ICD-10-CM | POA: Diagnosis present

## 2019-04-16 DIAGNOSIS — L02411 Cutaneous abscess of right axilla: Secondary | ICD-10-CM | POA: Diagnosis present

## 2019-04-16 DIAGNOSIS — Z6838 Body mass index (BMI) 38.0-38.9, adult: Secondary | ICD-10-CM | POA: Diagnosis not present

## 2019-04-16 DIAGNOSIS — Z8249 Family history of ischemic heart disease and other diseases of the circulatory system: Secondary | ICD-10-CM | POA: Diagnosis not present

## 2019-04-16 DIAGNOSIS — N61 Mastitis without abscess: Secondary | ICD-10-CM | POA: Diagnosis present

## 2019-04-16 DIAGNOSIS — F419 Anxiety disorder, unspecified: Secondary | ICD-10-CM | POA: Diagnosis present

## 2019-04-16 DIAGNOSIS — K219 Gastro-esophageal reflux disease without esophagitis: Secondary | ICD-10-CM | POA: Diagnosis present

## 2019-04-16 DIAGNOSIS — K760 Fatty (change of) liver, not elsewhere classified: Secondary | ICD-10-CM | POA: Diagnosis present

## 2019-04-16 DIAGNOSIS — D899 Disorder involving the immune mechanism, unspecified: Secondary | ICD-10-CM | POA: Diagnosis present

## 2019-04-16 DIAGNOSIS — N183 Chronic kidney disease, stage 3 (moderate): Secondary | ICD-10-CM | POA: Diagnosis present

## 2019-04-16 DIAGNOSIS — R945 Abnormal results of liver function studies: Secondary | ICD-10-CM

## 2019-04-16 DIAGNOSIS — I130 Hypertensive heart and chronic kidney disease with heart failure and stage 1 through stage 4 chronic kidney disease, or unspecified chronic kidney disease: Secondary | ICD-10-CM | POA: Diagnosis present

## 2019-04-16 DIAGNOSIS — Z825 Family history of asthma and other chronic lower respiratory diseases: Secondary | ICD-10-CM | POA: Diagnosis not present

## 2019-04-16 DIAGNOSIS — R7989 Other specified abnormal findings of blood chemistry: Secondary | ICD-10-CM

## 2019-04-16 DIAGNOSIS — J449 Chronic obstructive pulmonary disease, unspecified: Secondary | ICD-10-CM | POA: Diagnosis present

## 2019-04-16 DIAGNOSIS — E1151 Type 2 diabetes mellitus with diabetic peripheral angiopathy without gangrene: Secondary | ICD-10-CM | POA: Diagnosis present

## 2019-04-16 HISTORY — PX: INCISION AND DRAINAGE ABSCESS: SHX5864

## 2019-04-16 LAB — GLUCOSE, CAPILLARY
Glucose-Capillary: 155 mg/dL — ABNORMAL HIGH (ref 70–99)
Glucose-Capillary: 182 mg/dL — ABNORMAL HIGH (ref 70–99)
Glucose-Capillary: 151 mg/dL — ABNORMAL HIGH (ref 70–99)
Glucose-Capillary: 112 mg/dL — ABNORMAL HIGH (ref 70–99)
Glucose-Capillary: 73 mg/dL (ref 70–99)
Glucose-Capillary: 97 mg/dL (ref 70–99)

## 2019-04-16 LAB — URINE DRUG SCREEN, QUALITATIVE (ARMC ONLY)
Amphetamines, Ur Screen: NOT DETECTED
Barbiturates, Ur Screen: NOT DETECTED
Benzodiazepine, Ur Scrn: NOT DETECTED
Cannabinoid 50 Ng, Ur ~~LOC~~: NOT DETECTED
Cocaine Metabolite,Ur ~~LOC~~: NOT DETECTED
MDMA (Ecstasy)Ur Screen: NOT DETECTED
Methadone Scn, Ur: NOT DETECTED
Opiate, Ur Screen: NOT DETECTED
Phencyclidine (PCP) Ur S: NOT DETECTED
Tricyclic, Ur Screen: POSITIVE — AB

## 2019-04-16 LAB — HEMOGLOBIN A1C
Hgb A1c MFr Bld: 7.8 % — ABNORMAL HIGH (ref 4.8–5.6)
Mean Plasma Glucose: 177.16 mg/dL

## 2019-04-16 SURGERY — INCISION AND DRAINAGE, ABSCESS
Anesthesia: General | Laterality: Right

## 2019-04-16 MED ORDER — INSULIN ASPART 100 UNIT/ML ~~LOC~~ SOLN
0.0000 [IU] | Freq: Three times a day (TID) | SUBCUTANEOUS | Status: DC
  Administered 2019-04-16 – 2019-04-17 (×2): 4 [IU] via SUBCUTANEOUS
  Administered 2019-04-17: 13:00:00 7 [IU] via SUBCUTANEOUS
  Administered 2019-04-17: 3 [IU] via SUBCUTANEOUS
  Filled 2019-04-16 (×4): qty 1

## 2019-04-16 MED ORDER — GABAPENTIN 100 MG PO CAPS
200.0000 mg | ORAL_CAPSULE | Freq: Every day | ORAL | Status: DC
  Administered 2019-04-16: 21:00:00 200 mg via ORAL
  Filled 2019-04-16: qty 2

## 2019-04-16 MED ORDER — ONDANSETRON 4 MG PO TBDP
4.0000 mg | ORAL_TABLET | Freq: Four times a day (QID) | ORAL | Status: DC | PRN
  Filled 2019-04-16: qty 1

## 2019-04-16 MED ORDER — LIDOCAINE HCL (CARDIAC) PF 100 MG/5ML IV SOSY
PREFILLED_SYRINGE | INTRAVENOUS | Status: DC | PRN
  Administered 2019-04-16: 60 mg via INTRAVENOUS

## 2019-04-16 MED ORDER — MIDAZOLAM HCL 2 MG/2ML IJ SOLN
INTRAMUSCULAR | Status: AC
  Filled 2019-04-16: qty 2

## 2019-04-16 MED ORDER — ENOXAPARIN SODIUM 40 MG/0.4ML ~~LOC~~ SOLN
40.0000 mg | SUBCUTANEOUS | Status: DC
  Administered 2019-04-16: 16:00:00 40 mg via SUBCUTANEOUS
  Filled 2019-04-16 (×2): qty 0.4

## 2019-04-16 MED ORDER — FENTANYL CITRATE (PF) 100 MCG/2ML IJ SOLN
25.0000 ug | INTRAMUSCULAR | Status: DC | PRN
  Administered 2019-04-16: 05:00:00 25 ug via INTRAVENOUS
  Administered 2019-04-16: 05:00:00 50 ug via INTRAVENOUS
  Administered 2019-04-16: 05:00:00 25 ug via INTRAVENOUS

## 2019-04-16 MED ORDER — PROMETHAZINE HCL 25 MG/ML IJ SOLN: 6.2500 mg | INTRAMUSCULAR | Status: DC | PRN

## 2019-04-16 MED ORDER — ROCURONIUM BROMIDE 100 MG/10ML IV SOLN
INTRAVENOUS | Status: DC | PRN
  Administered 2019-04-16: 5 mg via INTRAVENOUS

## 2019-04-16 MED ORDER — PROPOFOL 10 MG/ML IV BOLUS
INTRAVENOUS | Status: DC | PRN
  Administered 2019-04-16: 170 mg via INTRAVENOUS

## 2019-04-16 MED ORDER — ONDANSETRON HCL 4 MG/2ML IJ SOLN: 4.0000 mg | Freq: Four times a day (QID) | INTRAMUSCULAR | Status: DC | PRN

## 2019-04-16 MED ORDER — PROPOFOL 10 MG/ML IV BOLUS
INTRAVENOUS | Status: AC
  Filled 2019-04-16: qty 20

## 2019-04-16 MED ORDER — BUPIVACAINE-EPINEPHRINE (PF) 0.5% -1:200000 IJ SOLN
INTRAMUSCULAR | Status: AC
  Filled 2019-04-16: qty 30

## 2019-04-16 MED ORDER — LISINOPRIL 5 MG PO TABS
2.5000 mg | ORAL_TABLET | Freq: Every day | ORAL | Status: DC
  Administered 2019-04-16 – 2019-04-17 (×2): 2.5 mg via ORAL
  Filled 2019-04-16 (×2): qty 1

## 2019-04-16 MED ORDER — ONDANSETRON HCL 4 MG/2ML IJ SOLN
INTRAMUSCULAR | Status: AC
  Filled 2019-04-16: qty 2

## 2019-04-16 MED ORDER — DIPHENHYDRAMINE HCL 50 MG/ML IJ SOLN
25.0000 mg | Freq: Once | INTRAMUSCULAR | Status: AC
  Administered 2019-04-16: 14:00:00 25 mg via INTRAVENOUS
  Filled 2019-04-16: qty 1

## 2019-04-16 MED ORDER — BUPIVACAINE-EPINEPHRINE (PF) 0.25% -1:200000 IJ SOLN
INTRAMUSCULAR | Status: DC | PRN
  Administered 2019-04-16: 2 mL via PERINEURAL

## 2019-04-16 MED ORDER — SUGAMMADEX SODIUM 200 MG/2ML IV SOLN
INTRAVENOUS | Status: DC | PRN
  Administered 2019-04-16: 180 mg via INTRAVENOUS

## 2019-04-16 MED ORDER — DEXMEDETOMIDINE HCL 200 MCG/2ML IV SOLN
INTRAVENOUS | Status: DC | PRN
  Administered 2019-04-16: 8 ug via INTRAVENOUS
  Administered 2019-04-16: 4 ug via INTRAVENOUS

## 2019-04-16 MED ORDER — SODIUM CHLORIDE 0.9 % IV SOLN
INTRAVENOUS | Status: AC
  Filled 2019-04-16: qty 2

## 2019-04-16 MED ORDER — EZETIMIBE 10 MG PO TABS
10.0000 mg | ORAL_TABLET | Freq: Every day | ORAL | Status: DC
  Administered 2019-04-16 – 2019-04-17 (×2): 10 mg via ORAL
  Filled 2019-04-16 (×2): qty 1

## 2019-04-16 MED ORDER — ATORVASTATIN CALCIUM 20 MG PO TABS
80.0000 mg | ORAL_TABLET | Freq: Every day | ORAL | Status: DC
  Administered 2019-04-16: 21:00:00 80 mg via ORAL
  Filled 2019-04-16: qty 4

## 2019-04-16 MED ORDER — FENTANYL CITRATE (PF) 100 MCG/2ML IJ SOLN
INTRAMUSCULAR | Status: DC | PRN
  Administered 2019-04-16 (×4): 50 ug via INTRAVENOUS

## 2019-04-16 MED ORDER — EMPAGLIFLOZIN 25 MG PO TABS: 25.0000 mg | ORAL_TABLET | Freq: Every day | ORAL | Status: DC

## 2019-04-16 MED ORDER — LEVOTHYROXINE SODIUM 50 MCG PO TABS
50.0000 ug | ORAL_TABLET | Freq: Every day | ORAL | Status: DC
  Administered 2019-04-17: 06:00:00 50 ug via ORAL
  Filled 2019-04-16: qty 1

## 2019-04-16 MED ORDER — TRAZODONE HCL 100 MG PO TABS
100.0000 mg | ORAL_TABLET | Freq: Every day | ORAL | Status: DC
  Administered 2019-04-16: 100 mg via ORAL
  Filled 2019-04-16: qty 1

## 2019-04-16 MED ORDER — PHENYLEPHRINE HCL (PRESSORS) 10 MG/ML IV SOLN
INTRAVENOUS | Status: DC | PRN
  Administered 2019-04-16: 50 ug via INTRAVENOUS

## 2019-04-16 MED ORDER — ONDANSETRON HCL 4 MG/2ML IJ SOLN
INTRAMUSCULAR | Status: DC | PRN
  Administered 2019-04-16: 4 mg via INTRAVENOUS

## 2019-04-16 MED ORDER — LAMOTRIGINE 25 MG PO TABS
200.0000 mg | ORAL_TABLET | Freq: Two times a day (BID) | ORAL | Status: DC
  Administered 2019-04-16 – 2019-04-17 (×3): 200 mg via ORAL
  Filled 2019-04-16 (×3): qty 8

## 2019-04-16 MED ORDER — DULOXETINE HCL 60 MG PO CPEP
120.0000 mg | ORAL_CAPSULE | Freq: Every day | ORAL | Status: DC
  Administered 2019-04-16 – 2019-04-17 (×2): 120 mg via ORAL
  Filled 2019-04-16 (×2): qty 2

## 2019-04-16 MED ORDER — METOPROLOL TARTRATE 25 MG PO TABS
25.0000 mg | ORAL_TABLET | Freq: Every morning | ORAL | Status: DC
  Administered 2019-04-16 – 2019-04-17 (×2): 25 mg via ORAL
  Filled 2019-04-16 (×2): qty 1

## 2019-04-16 MED ORDER — SUCCINYLCHOLINE CHLORIDE 20 MG/ML IJ SOLN
INTRAMUSCULAR | Status: DC | PRN
  Administered 2019-04-16: 100 mg via INTRAVENOUS

## 2019-04-16 MED ORDER — FENTANYL CITRATE (PF) 100 MCG/2ML IJ SOLN
INTRAMUSCULAR | Status: AC
  Filled 2019-04-16: qty 2

## 2019-04-16 MED ORDER — QUETIAPINE FUMARATE 200 MG PO TABS
400.0000 mg | ORAL_TABLET | Freq: Every day | ORAL | Status: DC
  Administered 2019-04-16: 21:00:00 400 mg via ORAL
  Filled 2019-04-16 (×2): qty 2

## 2019-04-16 MED ORDER — TRAMADOL HCL 50 MG PO TABS
50.0000 mg | ORAL_TABLET | Freq: Four times a day (QID) | ORAL | Status: DC | PRN
  Administered 2019-04-16 – 2019-04-17 (×2): 50 mg via ORAL
  Filled 2019-04-16 (×2): qty 1

## 2019-04-16 MED ORDER — DEXAMETHASONE SODIUM PHOSPHATE 10 MG/ML IJ SOLN
INTRAMUSCULAR | Status: AC
  Filled 2019-04-16: qty 1

## 2019-04-16 MED ORDER — OMEGA-3-ACID ETHYL ESTERS 1 G PO CAPS
1.0000 g | ORAL_CAPSULE | Freq: Every day | ORAL | Status: DC
  Administered 2019-04-16 – 2019-04-17 (×2): 1 g via ORAL
  Filled 2019-04-16 (×2): qty 1

## 2019-04-16 MED ORDER — FENTANYL CITRATE (PF) 100 MCG/2ML IJ SOLN
INTRAMUSCULAR | Status: AC
  Administered 2019-04-16: 50 ug via INTRAVENOUS
  Filled 2019-04-16: qty 2

## 2019-04-16 MED ORDER — BUPIVACAINE-EPINEPHRINE (PF) 0.25% -1:200000 IJ SOLN
INTRAMUSCULAR | Status: AC
  Filled 2019-04-16: qty 30

## 2019-04-16 MED ORDER — INSULIN REGULAR HUMAN (CONC) 500 UNIT/ML ~~LOC~~ SOPN: 60.0000 [IU] | PEN_INJECTOR | Freq: Three times a day (TID) | SUBCUTANEOUS | Status: DC

## 2019-04-16 MED ORDER — HYDROMORPHONE HCL 1 MG/ML IJ SOLN
0.5000 mg | INTRAMUSCULAR | Status: DC | PRN
  Administered 2019-04-16: 11:00:00 0.5 mg via INTRAVENOUS
  Filled 2019-04-16: qty 1

## 2019-04-16 MED ORDER — METOPROLOL TARTRATE 25 MG PO TABS
12.5000 mg | ORAL_TABLET | Freq: Every day | ORAL | Status: DC
  Administered 2019-04-17: 17:00:00 12.5 mg via ORAL
  Filled 2019-04-16: qty 1

## 2019-04-16 MED ORDER — CLINDAMYCIN PHOSPHATE 600 MG/50ML IV SOLN
INTRAVENOUS | Status: AC
  Filled 2019-04-16: qty 50

## 2019-04-16 MED ORDER — SODIUM CHLORIDE 0.9 % IV SOLN
INTRAVENOUS | Status: DC | PRN
  Administered 2019-04-16: 03:00:00 via INTRAVENOUS

## 2019-04-16 MED ORDER — VANCOMYCIN HCL IN DEXTROSE 750-5 MG/150ML-% IV SOLN
750.0000 mg | INTRAVENOUS | Status: DC
  Administered 2019-04-16: 21:00:00 750 mg via INTRAVENOUS
  Filled 2019-04-16 (×2): qty 150

## 2019-04-16 MED ORDER — MIDAZOLAM HCL 5 MG/5ML IJ SOLN
INTRAMUSCULAR | Status: DC | PRN
  Administered 2019-04-16: 2 mg via INTRAVENOUS

## 2019-04-16 MED ORDER — SUCCINYLCHOLINE CHLORIDE 20 MG/ML IJ SOLN
INTRAMUSCULAR | Status: AC
  Filled 2019-04-16: qty 1

## 2019-04-16 MED ORDER — INSULIN REGULAR HUMAN 100 UNIT/ML IJ SOLN
60.0000 [IU] | Freq: Three times a day (TID) | INTRAMUSCULAR | Status: DC
  Administered 2019-04-16: 09:00:00 60 [IU] via SUBCUTANEOUS
  Filled 2019-04-16: qty 10

## 2019-04-16 MED ORDER — GABAPENTIN 100 MG PO CAPS
100.0000 mg | ORAL_CAPSULE | Freq: Every morning | ORAL | Status: DC
  Administered 2019-04-16 – 2019-04-17 (×2): 100 mg via ORAL
  Filled 2019-04-16 (×2): qty 1

## 2019-04-16 MED ORDER — PANTOPRAZOLE SODIUM 40 MG PO TBEC
40.0000 mg | DELAYED_RELEASE_TABLET | Freq: Every day | ORAL | Status: DC
  Administered 2019-04-16 – 2019-04-17 (×2): 40 mg via ORAL
  Filled 2019-04-16 (×2): qty 1

## 2019-04-16 MED ORDER — LIDOCAINE HCL (PF) 2 % IJ SOLN
INTRAMUSCULAR | Status: AC
  Filled 2019-04-16: qty 10

## 2019-04-16 MED ORDER — ROCURONIUM BROMIDE 50 MG/5ML IV SOLN
INTRAVENOUS | Status: AC
  Filled 2019-04-16: qty 1

## 2019-04-16 MED ORDER — AMOXICILLIN 500 MG PO CAPS
500.0000 mg | ORAL_CAPSULE | Freq: Every day | ORAL | Status: DC
  Administered 2019-04-16 – 2019-04-17 (×2): 500 mg via ORAL
  Filled 2019-04-16 (×2): qty 1

## 2019-04-16 MED ORDER — HYDROXYZINE HCL 50 MG PO TABS
50.0000 mg | ORAL_TABLET | Freq: Every day | ORAL | Status: DC | PRN
  Filled 2019-04-16: qty 2

## 2019-04-16 MED ORDER — DULAGLUTIDE 1.5 MG/0.5ML ~~LOC~~ SOAJ: 1.5000 mL | SUBCUTANEOUS | Status: DC

## 2019-04-16 SURGICAL SUPPLY — 27 items
BLADE SURG 15 STRL LF DISP TIS (BLADE) ×1 IMPLANT
BLADE SURG 15 STRL SS (BLADE) ×1
CANISTER SUCT 1200ML W/VALVE (MISCELLANEOUS) ×2 IMPLANT
CHLORAPREP W/TINT 26 (MISCELLANEOUS) ×2 IMPLANT
COVER WAND RF STERILE (DRAPES) ×2 IMPLANT
DRAPE LAPAROTOMY 100X77 ABD (DRAPES) ×2 IMPLANT
ELECT REM PT RETURN 9FT ADLT (ELECTROSURGICAL) ×2
ELECTRODE REM PT RTRN 9FT ADLT (ELECTROSURGICAL) ×1 IMPLANT
GLOVE BIOGEL PI IND STRL 7.0 (GLOVE) ×1 IMPLANT
GLOVE BIOGEL PI INDICATOR 7.0 (GLOVE) ×1
GLOVE SURG SYN 6.5 ES PF (GLOVE) ×4 IMPLANT
GOWN STRL REUS W/ TWL LRG LVL3 (GOWN DISPOSABLE) ×2 IMPLANT
GOWN STRL REUS W/TWL LRG LVL3 (GOWN DISPOSABLE) ×2
LABEL OR SOLS (LABEL) ×2 IMPLANT
NEEDLE HYPO 22GX1.5 SAFETY (NEEDLE) ×2 IMPLANT
NS IRRIG 500ML POUR BTL (IV SOLUTION) ×2 IMPLANT
PACK BASIN MINOR ARMC (MISCELLANEOUS) ×2 IMPLANT
SLEEVE SCD COMPRESS THIGH MED (MISCELLANEOUS) ×2 IMPLANT
SUT MNCRL 4-0 (SUTURE) ×1
SUT MNCRL 4-0 27XMFL (SUTURE) ×1
SUT VIC AB 2-0 CT2 27 (SUTURE) ×2 IMPLANT
SUT VIC AB 3-0 SH 27 (SUTURE) ×1
SUT VIC AB 3-0 SH 27X BRD (SUTURE) ×1 IMPLANT
SUTURE MNCRL 4-0 27XMF (SUTURE) ×1 IMPLANT
SYR 10ML LL (SYRINGE) ×2 IMPLANT
SYR BULB IRRIG 60ML STRL (SYRINGE) ×2 IMPLANT
TOWEL OR 17X26 4PK STRL BLUE (TOWEL DISPOSABLE) ×2 IMPLANT

## 2019-04-16 NOTE — Progress Notes (Signed)
Subjective:  CC: [name] is a 53 y.o. female  Hospital stay day 0, Day of Surgery Right axillary abscess  HPI: No issues, still tender but improving.  ROS:  General: Denies weight loss, weight gain, fatigue, fevers, chills, and night sweats. Heart: Denies chest pain, palpitations, racing heart, irregular heartbeat, leg pain or swelling, and decreased activity tolerance. Respiratory: Denies breathing difficulty, shortness of breath, wheezing, cough, and sputum. GI: Denies change in appetite, heartburn, nausea, vomiting, constipation, diarrhea, and blood in stool. GU: Denies difficulty urinating, pain with urinating, urgency, frequency, blood in urine.   Objective:   Temp:  [97.5 F (36.4 C)-101.2 F (38.4 C)] 98.2 F (36.8 C) (08/12 0724) Pulse Rate:  [101-111] 110 (08/12 0724) Resp:  [12-27] 17 (08/12 0724) BP: (103-147)/(59-86) 115/67 (08/12 0724) SpO2:  [89 %-100 %] 96 % (08/12 0724) Weight:  [86.2 kg] 86.2 kg (08/11 1823)     Height: 4' 11"  (149.9 cm) Weight: 86.2 kg BMI (Calculated): 38.35   Intake/Output this shift:   Intake/Output Summary (Last 24 hours) at 04/16/2019 0800 Last data filed at 04/16/2019 0600 Gross per 24 hour  Intake 1080 ml  Output 50 ml  Net 1030 ml    Constitutional :  alert, cooperative, appears stated age and no distress  Respiratory:  clear to auscultation bilaterally  Cardiovascular:  regular rate and rhythm  Skin: Cool and moist. Left axillary abscess with decreased tenderness, serous drainage, but still some present cellulitis  Psychiatric: Normal affect, non-agitated, not confused       LABS:  CMP Latest Ref Rng & Units 04/15/2019 01/03/2019 11/08/2018  Glucose 70 - 99 mg/dL 225(H) 437(H) 323(H)  BUN 6 - 20 mg/dL 25(H) 37(H) 27(H)  Creatinine 0.44 - 1.00 mg/dL 1.07(H) 1.33(H) 1.12(H)  Sodium 135 - 145 mmol/L 135 134(L) 140  Potassium 3.5 - 5.1 mmol/L 3.9 4.7 5.1  Chloride 98 - 111 mmol/L 99 96(L) 105  CO2 22 - 32 mmol/L 25 25 28    Calcium 8.9 - 10.3 mg/dL 10.1 9.4 9.7  Total Protein 6.5 - 8.1 g/dL 7.0 - -  Total Bilirubin 0.3 - 1.2 mg/dL 0.8 - -  Alkaline Phos 38 - 126 U/L 55 - -  AST 15 - 41 U/L 27 - -  ALT 0 - 44 U/L 24 - -   CBC Latest Ref Rng & Units 04/15/2019 01/03/2019 11/08/2018  WBC 4.0 - 10.5 K/uL 13.2(H) 8.4 8.2  Hemoglobin 12.0 - 15.0 g/dL 11.1(L) 12.1 10.8(L)  Hematocrit 36.0 - 46.0 % 35.5(L) 37.5 33.7(L)  Platelets 150 - 400 K/uL 246 282 327    RADS: n/a Assessment:   S/p I&D of right axillary abscess.  Will arrange wound vac after discussion with patient.  Resume abx coverage for cellulitis.  First dressing change tomorrow and then d/c once wound vac equipment is available.    Continue home meds for her chronic issues.

## 2019-04-16 NOTE — TOC Progression Note (Signed)
Transition of Care [facility]) - Progression Note    Patient Details  Name: [name] MRN: [id number] Date of Birth: [birth year]  Transition of Care [facility]) CM/SW Contact  Su Hilt, RN Phone Number: 04/16/2019, 10:24 AM  Clinical Narrative:    Damaris Schooner with Olivia Mackie with [employer] about wound vac need.   Spoke with Helene Kelp with [facility] about need for nursing wound care and wound vac care, she is running the insurance information to see if they in INN with the patient's insurance and she will let me know        Expected Discharge Plan and Services                                                 Social Determinants of Health (SDOH) Interventions    Readmission Risk Interventions No flowsheet data found.

## 2019-04-16 NOTE — Anesthesia Postprocedure Evaluation (Signed)
Anesthesia Post Note  Patient: [name]  Procedure(s) Performed: INCISION AND DRAINAGE ABSCESS (Right )  Patient location during evaluation: PACU Anesthesia Type: General Level of consciousness: awake and alert Pain management: pain level controlled Vital Signs Assessment: post-procedure vital signs reviewed and stable Respiratory status: spontaneous breathing, nonlabored ventilation, respiratory function stable and patient connected to nasal cannula oxygen Cardiovascular status: blood pressure returned to baseline and stable Postop Assessment: no apparent nausea or vomiting Anesthetic complications: no     Last Vitals:  Vitals:   04/16/19 0542 04/16/19 0724  BP: (!) 115/59 115/67  Pulse: (!) 107 (!) 110  Resp: 18 17  Temp: 36.7 C 36.8 C  SpO2: 96% 96%    Last Pain:  Vitals:   04/16/19 0724  TempSrc: Oral  PainSc:                  Martha Clan

## 2019-04-16 NOTE — Op Note (Signed)
Pre-Op Dx: Right axillary abscess Post-Op Dx: same Anesthesia: GETA EBL: 50 mL's Complications:  none apparent Specimen: none Procedure: Incision and drainage of right axillary abscess Surgeon: Lysle Pearl Indication for procedure: Patient presenting with large right axillary abscess.  See H&P for further details.  Description of Procedure:  Consent obtained, time out performed.  GETA administered by anesthesia.  Antibiotics continued from 4.  Patient placed in supine position.  Area sterilized and draped in usual position.  Local infused to area previously marked over the most indurated portion in the right axilla with active purulent drainage.    8 cm cm incision made through dermis with 15blade and couple pockets of abscess noted with purulent material noted in subcutaneous layer.  2 cavities adjacent to each other and eventually connected after removal of necrotic tissue using sharp dissection, not involving the fascia.  The 8cm x 2cm x 4cm deep wound within the subcutaneous tissue then extensively irrigated, hemostasis achieved with a combination of electrocautery and then a single suture ligation of a actively bleeding vessel using 3-0 silk.  After confirming adequate hemostasis and no further necrotic tissue or active purulent drainage or loculations, wound was packed with 4 x 4 and then covered with additional 4 x 4 and ABD pad, secured with paper tape.  Patient awakened from anesthesia and transferred to PACU in stable condition.  At the end of the procedure, all sponge and instrument counts were correct.

## 2019-04-16 NOTE — Anesthesia Procedure Notes (Signed)
Procedure Name: Intubation Date/Time: 04/16/2019 3:24 AM Performed by: Dionne Bucy, CRNA Pre-anesthesia Checklist: Patient identified, Patient being monitored, Timeout performed, Emergency Drugs available and Suction available Patient Re-evaluated:Patient Re-evaluated prior to induction Oxygen Delivery Method: Circle system utilized Preoxygenation: Pre-oxygenation with 100% oxygen Induction Type: IV induction Ventilation: Mask ventilation without difficulty Laryngoscope Size: 3 and McGraph Grade View: Grade I Tube type: Oral Tube size: 7.0 mm Number of attempts: 1 Airway Equipment and Method: Stylet and Video-laryngoscopy Placement Confirmation: ETT inserted through vocal cords under direct vision,  positive ETCO2 and breath sounds checked- equal and bilateral Secured at: 20 cm Tube secured with: Tape Dental Injury: Teeth and Oropharynx as per pre-operative assessment

## 2019-04-16 NOTE — Anesthesia Preprocedure Evaluation (Signed)
Anesthesia Evaluation  Patient identified by MRN, date of birth, ID band Patient awake    Reviewed: Allergy & Precautions, H&P , NPO status , Patient's Chart, lab work & pertinent test results, reviewed documented beta blocker date and time   History of Anesthesia Complications Negative for: history of anesthetic complications  Airway Mallampati: III   Neck ROM: full    Dental  (+) Poor Dentition, Teeth Intact, Dental Advidsory Given   Pulmonary shortness of breath and with exertion, asthma , sleep apnea, Continuous Positive Airway Pressure Ventilation and Oxygen sleep apnea , pneumonia, COPD, neg recent URI,    Pulmonary exam normal        Cardiovascular Exercise Tolerance: Poor hypertension, On Medications (-) angina+ CAD, + Past MI, + Peripheral Vascular Disease and +CHF  (-) Cardiac Stents and (-) CABG Normal cardiovascular exam+ dysrhythmias Supra Ventricular Tachycardia (-) Valvular Problems/Murmurs     Neuro/Psych  Headaches, neg Seizures PSYCHIATRIC DISORDERS Anxiety Depression Bipolar Disorder Schizophrenia  Neuromuscular disease    GI/Hepatic PUD, GERD  Medicated,(+) Hepatitis -NAFLD   Endo/Other  diabetesHypothyroidism Morbid obesity  Renal/GU CRFRenal disease  negative genitourinary   Musculoskeletal   Abdominal   Peds  Hematology negative hematology ROS (+)   Anesthesia Other Findings Past Medical History: No date: Anxiety No date: Arthritis No date: Asthma No date: Bipolar depression (HCC) No date: CHF (congestive heart failure) (HCC) No date: Chronic headaches     Comment:  migraines have lessened No date: Chronic kidney disease     Comment:  ddstage 111 No date: COPD (chronic obstructive pulmonary disease) (HCC) No date: Coronary artery disease No date: Depression No date: Diabetic neuropathy (HCC) No date: Dyspnea     Comment:  uses inhalers for this No date: Dysrhythmia     Comment:   rapid No date: Fatty liver No date: GERD (gastroesophageal reflux disease) No date: Headache 2009: Heart attack ([place]) No date: Hyperlipidemia No date: Hypertension No date: Hypomagnesemia No date: Hypothyroid No date: Irregular heart rhythm No date: Liver lesion     Comment:  Favored to be benign on CT; MRI of abd w/contract in Aug              2016. Liver Biopsy-Negative, March 2016 No date: Morbid obesity (HCC) No date: OSA (obstructive sleep apnea) No date: Peripheral vascular disease ([place]) No date: Pinched nerve     Comment:  Back No date: Pneumonia No date: RLS (restless legs syndrome) No date: Seasonal allergies No date: Sleep apnea     Comment:  uses oxygen at night 2L d/t cpap did not fit 10/15/2015: Type 2 diabetes, uncontrolled, with neuropathy (HCC)     Comment:  Overview:  Microalbumin 7.7 12/2010: Hgb A1c 10.2% 12/2010              and 9.7% 03/2011, eye exam 10/2009 [facility]  No date: Urinary incontinence No date: Vitamin D deficiency Past Surgical History: No date: ABDOMINAL HYSTERECTOMY No date: ABDOMINAL HYSTERECTOMY     Comment:  Partial 2007: bladder stim No date: BLADDER SURGERY     Comment:  x 2. tacking of bladder and the other was the stimulator              placement No date: CHOLECYSTECTOMY 12/31/2017: INTERSTIM-GENERATOR CHANGE; N/A     Comment:  Procedure: INTERSTIM-GENERATOR CHANGE;  Surgeon:               Bjorn Loser, MD;  Location: [facility] ORS;  Service:  Urology;  Laterality: N/A; March 2016: LIVER BIOPSY     Comment:  Negative 2015: LUNG BIOPSY     Comment:  small spot on lung No date: TUBAL LIGATION   Reproductive/Obstetrics negative OB ROS                             Anesthesia Physical  Anesthesia Plan  ASA: IV  Anesthesia Plan: General   Post-op Pain Management:    Induction: Intravenous  PONV Risk Score and Plan: 3 and Ondansetron, Dexamethasone, Midazolam, Promethazine and  Treatment may vary due to age or medical condition  Airway Management Planned: Oral ETT  Additional Equipment:   Intra-op Plan:   Post-operative Plan: Extubation in OR  Informed Consent: I have reviewed the patients History and Physical, chart, labs and discussed the procedure including the risks, benefits and alternatives for the proposed anesthesia with the patient or authorized representative who has indicated his/her understanding and acceptance.     Dental Advisory Given  Plan Discussed with: CRNA  Anesthesia Plan Comments:         Anesthesia Quick Evaluation

## 2019-04-16 NOTE — TOC Progression Note (Signed)
Transition of Care [facility]) - Progression Note    Patient Details  Name: [name] MRN: [id number] Date of Birth: 1966-05-01  Transition of Care [facility]) CM/SW [place], RN Phone Number: 04/16/2019, 11:19 AM  Clinical Narrative:        Unable to find any [place] agency that has nursing to be able to support the Wound Vac and wound care at home, I notified Dr Lysle Pearl and he stated his office would work out care for the wound vac    Expected Discharge Plan and Services                                                 Social Determinants of Health (SDOH) Interventions    Readmission Risk Interventions No flowsheet data found.

## 2019-04-16 NOTE — Anesthesia Post-op Follow-up Note (Signed)
Anesthesia QCDR form completed.        

## 2019-04-16 NOTE — Transfer of Care (Signed)
Immediate Anesthesia Transfer of Care Note  Patient: [name]  Procedure(s) Performed: INCISION AND DRAINAGE ABSCESS (Right )  Patient Location: PACU  Anesthesia Type:General  Level of Consciousness: awake and patient cooperative  Airway & Oxygen Therapy: Patient Spontanous Breathing and Patient connected to face mask oxygen  Post-op Assessment: Report given to RN and Post -op Vital signs reviewed and stable  Post vital signs: Reviewed and stable  Last Vitals:  Vitals Value Taken Time  BP 136/67 04/16/19 0410  Temp 36.4 C 04/16/19 0410  Pulse 108 04/16/19 0411  Resp 23 04/16/19 0411  SpO2 100 % 04/16/19 0411  Vitals shown include unvalidated device data.  Last Pain:  Vitals:   04/15/19 2230  TempSrc:   PainSc: 9          Complications: No apparent anesthesia complications

## 2019-04-16 NOTE — Consult Note (Signed)
Pharmacy Antibiotic Note  [name] is a 53 y.o. female admitted on 04/15/2019 with Abscess.  Pharmacy has been consulted for vancomycin dosing.  Plan: Pt received vancomycin 1500 mg loading dose. Will order vancomycin 750 mg q24H for a predicted AUC of 457. Goal AUC is 400-550. Css min 12. Scr used 1.07.   Height: 4' 11"  (149.9 cm) Weight: 190 lb (86.2 kg) IBW/kg (Calculated) : 43.2  Temp (24hrs), Avg:98.6 F (37 C), Min:97.5 F (36.4 C), Max:101.2 F (38.4 C)  Recent Labs  Lab 04/15/19 1850 04/15/19 2056  WBC 13.2*  --   CREATININE 1.07*  --   LATICACIDVEN 1.0 1.0    Estimated Creatinine Clearance: 56.6 mL/min (A) (by C-G formula based on SCr of 1.07 mg/dL (H)).    Allergies  Allergen Reactions  . Mirtazapine Anaphylaxis    REACTION: closes throat  . Morphine And Related     anaphylaxis  . Sulfamethoxazole-Trimethoprim Rash    REACTION: Whelps all over  . Amoxicillin Er Nausea And Vomiting    Has patient had a PCN reaction causing immediate rash, facial/tongue/throat swelling, SOB or lightheadedness with hypotension: No Has patient had a PCN reaction causing severe rash involving mucus membranes or skin necrosis: No Has patient had a PCN reaction that required hospitalization: No Has patient had a PCN reaction occurring within the last 10 years: Yes If all of the above answers are "NO", then may proceed with Cephalosporin use.  . Codeine Itching    REACTION: itch, rash  . Hydrocodone-Acetaminophen Rash    REACTION: rash  . Keflex [Cephalexin] Rash  . Prasterone Nausea And Vomiting    Antimicrobials this admission: 812 vancomycin >>   Dose adjustments this admission: None  Microbiology results: 8/11 BCx: pending   Thank you for allowing pharmacy to be a part of this patient's care.  Oswald Hillock, PharmD, BCPS 04/16/2019 11:19 AM

## 2019-04-16 NOTE — Telephone Encounter (Signed)
Received fax from Dr. Barkley [facility] stating patieht has +NASH test and needs hematologist referral.  Please clarify if he wants hematologist or hepatologist referral - handwriting is a bit unclear.

## 2019-04-17 LAB — BASIC METABOLIC PANEL
Anion gap: 8 (ref 5–15)
BUN: 28 mg/dL — ABNORMAL HIGH (ref 6–20)
CO2: 25 mmol/L (ref 22–32)
Calcium: 8.9 mg/dL (ref 8.9–10.3)
Chloride: 105 mmol/L (ref 98–111)
Creatinine, Ser: 1.18 mg/dL — ABNORMAL HIGH (ref 0.44–1.00)
GFR calc Af Amer: >60 mL/min (ref >60)
GFR calc non Af Amer: 52 mL/min — ABNORMAL LOW (ref >60)
Glucose, Bld: 170 mg/dL — ABNORMAL HIGH (ref 70–99)
Potassium: 3.7 mmol/L (ref 3.5–5.1)
Sodium: 138 mmol/L (ref 135–145)

## 2019-04-17 LAB — CBC
HCT: 31.5 % — ABNORMAL LOW (ref 36.0–46.0)
Hemoglobin: 9.7 g/dL — ABNORMAL LOW (ref 12.0–15.0)
MCH: 28.4 pg (ref 26.0–34.0)
MCHC: 30.8 g/dL (ref 30.0–36.0)
MCV: 92.4 fL (ref 80.0–100.0)
Platelets: 247 10*3/uL (ref 150–400)
RBC: 3.41 MIL/uL — ABNORMAL LOW (ref 3.87–5.11)
RDW: 14.2 % (ref 11.5–15.5)
WBC: 10.7 10*3/uL — ABNORMAL HIGH (ref 4.0–10.5)
nRBC: 0 % (ref 0.0–0.2)

## 2019-04-17 LAB — GLUCOSE, CAPILLARY
Glucose-Capillary: 155 mg/dL — ABNORMAL HIGH (ref 70–99)
Glucose-Capillary: 222 mg/dL — ABNORMAL HIGH (ref 70–99)
Glucose-Capillary: 142 mg/dL — ABNORMAL HIGH (ref 70–99)

## 2019-04-17 MED ORDER — INSULIN ASPART 100 UNIT/ML ~~LOC~~ SOLN
60.0000 [IU] | Freq: Three times a day (TID) | SUBCUTANEOUS | Status: DC
  Filled 2019-04-17: qty 1

## 2019-04-17 MED ORDER — INSULIN REGULAR HUMAN (CONC) 500 UNIT/ML ~~LOC~~ SOPN
60.0000 [IU] | PEN_INJECTOR | Freq: Three times a day (TID) | SUBCUTANEOUS | Status: DC
  Administered 2019-04-17 (×2): 60 [IU] via SUBCUTANEOUS
  Filled 2019-04-17: qty 3

## 2019-04-17 MED ORDER — TRAMADOL HCL 50 MG PO TABS: 50.0000 mg | ORAL_TABLET | Freq: Four times a day (QID) | ORAL | 0 refills | Status: DC | PRN

## 2019-04-17 NOTE — Addendum Note (Signed)
Addended by: Hubbard Hartshorn on: 04/17/2019 02:46 PM   Modules accepted: Orders

## 2019-04-17 NOTE — Progress Notes (Signed)
Inpatient Diabetes Program Recommendations  AACE/ADA: New Consensus Statement on Inpatient Glycemic Control   Target Ranges:  Prepandial:   less than 140 mg/dL      Peak postprandial:   less than 180 mg/dL (1-2 hours)      Critically ill patients:  140 - 180 mg/dL   Results for [name], [name] (MRN [id number]) as of 04/17/2019 10:38  Ref. Range 04/16/2019 08:33 04/16/2019 11:30 04/16/2019 16:50 04/16/2019 21:11 04/17/2019 07:37  Glucose-Capillary Latest Ref Range: 70 - 99 mg/dL 151 (H)  Novolog 4 units  U500 60 units @9 :01 am 112 (H) 73 97 155 (H)  Novoog 4 units  Results for [name], [name] (MRN [id number]) as of 04/17/2019 10:38  Ref. Range 04/15/2019 18:50  Hemoglobin A1C Latest Ref Range: 4.8 - 5.6 % 7.8 (H)   Review of Glycemic Control  Diabetes history: DM2 Outpatient Diabetes medications: Humulin R U500 60 units TID with meals, Novolog 5-25 units TID with meals, Trulicity 1.5 mg Qweek (Sunday) Current orders for Inpatient glycemic control: Humulin R U500 60 units TID with melas, Novolog 0-20 units TID with meals  Inpatient Diabetes Program Recommendations:   Insulin-Basal: In reviewing chart, noted patient only received Humulin R U500 60 units once on 8/12 (at 9:01 am) and glucose ranged from 73-112 mg/dl the rest of the day. Fasting glucose 155 mg/dl this morning and Humulin R U500 was not given (pt refused) this morning. Please consider decreasing Humulin R U500 to 15 units TID with meals.  NOTE: In reviewing chart, noted patient is followed by Dr. Carmela Rima for DM management and takes Humulin R U500 (concentrated insulin) as an outpatient.  Thanks, Barnie Alderman, RN, MSN, CDE Diabetes Coordinator Inpatient Diabetes Program [phone] (Team Pager from 8am to 5pm)

## 2019-04-17 NOTE — TOC Progression Note (Signed)
Transition of Care [facility]) - Progression Note    Patient Details  Name: [name] MRN: [id number] Date of Birth: 1966/04/12  Transition of Care [facility]) CM/SW Contact  Su Hilt, RN Phone Number: 04/17/2019, 9:03 AM  Clinical Narrative:     [name] notified me that the Wound Vac would be delivered to the patient today       Expected Discharge Plan and Services                                                 Social Determinants of Health (SDOH) Interventions    Readmission Risk Interventions No flowsheet data found.

## 2019-04-17 NOTE — Discharge Instructions (Signed)
Skin Abscess  A skin abscess is an infected area of your skin that contains pus and other material. An abscess can happen in any part of your body. Some abscesses break open (rupture) on their own. Most continue to get worse unless they are treated. The infection can spread deeper into the body and into your blood, which can make you feel sick. A skin abscess is caused by germs that enter the skin through a cut or scrape. It can also be caused by blocked oil and sweat glands or infected hair follicles. This condition is usually treated by:  Draining the pus.  Taking antibiotic medicines.  Placing a warm, wet washcloth over the abscess. Follow these instructions at home: Medicines   Take over-the-counter and prescription medicines only as told by your doctor.  If you were prescribed an antibiotic medicine, take it as told by your doctor. Do not stop taking the antibiotic even if you start to feel better. Abscess care KEEP wound vac dressings intact as previously instructed.  Call office with any issues.  OK to bath around the incision, keep dressing dry.  General instructions  To avoid spreading the infection: ? Do not share personal care items, towels, or hot tubs with others. ? Avoid making skin-to-skin contact with other people.  Keep all follow-up visits as told by your doctor. This is important. Contact a doctor if:  You have more redness, swelling, or pain around your abscess.  You have more fluid or blood coming from your abscess.  Your abscess feels warm when you touch it.  You have more pus or a bad smell coming from your abscess.  You have a fever.  Your muscles ache.  You have chills.  You feel sick. Get help right away if:  You have very bad (severe) pain.  You see red streaks on your skin spreading away from the abscess. Summary  A skin abscess is an infected area of your skin that contains pus and other material.  The abscess is caused by germs that  enter the skin through a cut or scrape. It can also be caused by blocked oil and sweat glands or infected hair follicles.  Follow your doctor's instructions on caring for your abscess, taking medicines, preventing infections, and keeping follow-up visits. This information is not intended to replace advice given to you by your health care provider. Make sure you discuss any questions you have with your health care provider. Document Released: 02/07/2008 Document Revised: 12/12/2018 Document Reviewed: 10/04/2017 Elsevier Patient Education  2020 [employer].

## 2019-04-17 NOTE — Discharge Summary (Signed)
Physician Discharge Summary  Patient ID: [name] MRN: [id number] DOB/AGE: 1966-03-07 53 y.o.  Admit date: 04/15/2019 Discharge date: 04/17/2019  Admission Diagnoses: right axillary abscess  Discharge Diagnoses:  Same as above  Discharged Condition: good  Hospital Course: admitted for above.  S/p I&D and wound vac placement.  At time of d/c, wbc trending down, pain and erythema around site improved.  Wound clean enough to place wound vac at bedside.  Continue oral abx at home and F/u in office for wound vac change.  Consults: None  Discharge Exam: Blood pressure (!) 114/47, pulse 85, temperature 98.1 F (36.7 C), temperature source Oral, resp. rate 19, height 4' 11" (1.499 m), weight 86.2 kg, SpO2 96 %. General appearance: alert, cooperative and no distress Skin: Skin color, texture, turgor normal. No rashes or lesions or right axillary abscess site clean, no additional drainage.  erythema and tenderness improving.  Disposition:  Discharge disposition: 01-Home or Self Care       Discharge Instructions    Discharge patient   Complete by: As directed    Discharge disposition: 01-Home or Self Care   Discharge patient date: 04/17/2019     Allergies as of 04/17/2019      Reactions   Mirtazapine Anaphylaxis   REACTION: closes throat   Morphine And Related    anaphylaxis   Sulfamethoxazole-trimethoprim Rash   REACTION: Whelps all over   Amoxicillin Er Nausea And Vomiting   Has patient had a PCN reaction causing immediate rash, facial/tongue/throat swelling, SOB or lightheadedness with hypotension: No Has patient had a PCN reaction causing severe rash involving mucus membranes or skin necrosis: No Has patient had a PCN reaction that required hospitalization: No Has patient had a PCN reaction occurring within the last 10 years: Yes If all of the above answers are "NO", then may proceed with Cephalosporin use.   Codeine Itching   REACTION: itch, rash   Hydrocodone-acetaminophen Rash   REACTION: rash   Keflex [cephalexin] Rash   Prasterone Nausea And Vomiting      Medication List    STOP taking these medications   albuterol 108 (90 Base) MCG/ACT inhaler Commonly known as: VENTOLIN HFA   budesonide-formoterol 160-4.5 MCG/ACT inhaler Commonly known as: SYMBICORT   clindamycin 150 MG capsule Commonly known as: Cleocin   fluticasone 50 MCG/ACT nasal spray Commonly known as: FLONASE   mupirocin ointment 2 % Commonly known as: Bactroban   Na Sulfate-K Sulfate-Mg Sulf 17.5-3.13-1.6 GM/177ML Soln Commonly known as: Suprep Bowel Prep Kit     TAKE these medications   amoxicillin 500 MG capsule Commonly known as: AMOXIL Take 500 mg by mouth daily.   atorvastatin 80 MG tablet Commonly known as: LIPITOR Take 80 mg by mouth at bedtime.   busPIRone 10 MG tablet Commonly known as: BUSPAR Take 10 mg by mouth 2 (two) times daily.   DULoxetine 60 MG capsule Commonly known as: CYMBALTA Take 120 mg by mouth daily.   empagliflozin 25 MG Tabs tablet Commonly known as: JARDIANCE Take 25 mg by mouth daily.   ezetimibe 10 MG tablet Commonly known as: ZETIA Take 10 mg by mouth daily.   fenofibrate micronized 134 MG capsule Commonly known as: LOFIBRA Take 134 mg by mouth daily.   FISH OIL PO Take by mouth daily.   gabapentin 300 MG capsule Commonly known as: Neurontin Take 1 tablet each morning and 2 tablets at bedtime.   HumuLIN R U-500 KwikPen 500 UNIT/ML kwikpen Generic drug: insulin regular human CONCENTRATED  Inject 60 Units as directed 3 (three) times daily.   insulin aspart 100 UNIT/ML injection Commonly known as: novoLOG Inject 5-25 Units into the skin 3 (three) times daily before meals.   lamoTRIgine 200 MG tablet Commonly known as: LAMICTAL Take 200 mg by mouth 2 (two) times daily.   levothyroxine 50 MCG tablet Commonly known as: SYNTHROID 1 TAB daily three days a week,  1.5 TABS four days a week (Mon, Wed,  Fri, Sat for example)   lisinopril 2.5 MG tablet Commonly known as: ZESTRIL Take 1 tablet (2.5 mg total) by mouth daily.   metoprolol tartrate 25 MG tablet Commonly known as: LOPRESSOR Take one tablet 25 mg in the am and 1/2 tab in the pm   ondansetron 4 MG tablet Commonly known as: ZOFRAN Take 1 tablet (4 mg total) by mouth every 8 (eight) hours as needed for nausea or vomiting.   pantoprazole 40 MG tablet Commonly known as: PROTONIX Take 1 tablet (40 mg total) by mouth daily.   QUEtiapine 400 MG tablet Commonly known as: SEROQUEL Take 400 mg by mouth at bedtime.   traMADol 50 MG tablet Commonly known as: Ultram Take 1 tablet (50 mg total) by mouth every 6 (six) hours as needed (pain).   traZODone 100 MG tablet Commonly known as: DESYREL Take 100 mg by mouth at bedtime.   Trulicity 1.5 QM/0.8QP Sopn Generic drug: Dulaglutide Inject 1.5 mLs as directed every Sunday.      Follow-up Information    Benjamine Sprague, DO Follow up on 04/21/2019.   Specialty: Surgery Why: for wound vac change.  Please bring supplies with you to appt Contact information: [address] [place] [address] [phone]            Total time spent arranging discharge was >63mn. Signed: IBenjamine Sprague8/13/2020, 3:59 PM

## 2019-04-17 NOTE — Progress Notes (Addendum)
Discharge instructions provided to patient. Patient reports no questions. Patient transported home by daughter. Lupita Leash

## 2019-04-17 NOTE — Telephone Encounter (Signed)
Per nurse at Dr. Bary Leriche office it is Hematologist

## 2019-04-18 ENCOUNTER — Encounter: Payer: Self-pay | Admitting: General Practice

## 2019-04-20 LAB — CULTURE, BLOOD (ROUTINE X 2)
Culture: NO GROWTH
Culture: NO GROWTH
Special Requests: ADEQUATE
Special Requests: ADEQUATE

## 2019-04-21 ENCOUNTER — Telehealth: Payer: Self-pay | Admitting: Family Medicine

## 2019-04-21 DIAGNOSIS — R7989 Other specified abnormal findings of blood chemistry: Secondary | ICD-10-CM

## 2019-04-21 DIAGNOSIS — R945 Abnormal results of liver function studies: Secondary | ICD-10-CM

## 2019-04-21 NOTE — Telephone Encounter (Signed)
Received message from Dr. Grayland Ormond with hematology/oncology who agrees that patient would benefit from hepatologist, rather than hematology for her +NASH testing.  Referral is placed at this time.

## 2019-04-25 ENCOUNTER — Encounter: Payer: Self-pay | Admitting: Family Medicine

## 2019-04-25 IMAGING — CR DG KNEE COMPLETE 4+V*R*
1 series · 4 of 4 positions shown · non-contrast
Comparison: None.

CLINICAL DATA: Acute right knee pain after multiple falls.

EXAM:
RIGHT KNEE - COMPLETE 4+ VIEW

[Series 1: dg knee complete 4 views right · 0.14mm/px · 4 of 4 slices shown]
[im 1/4]
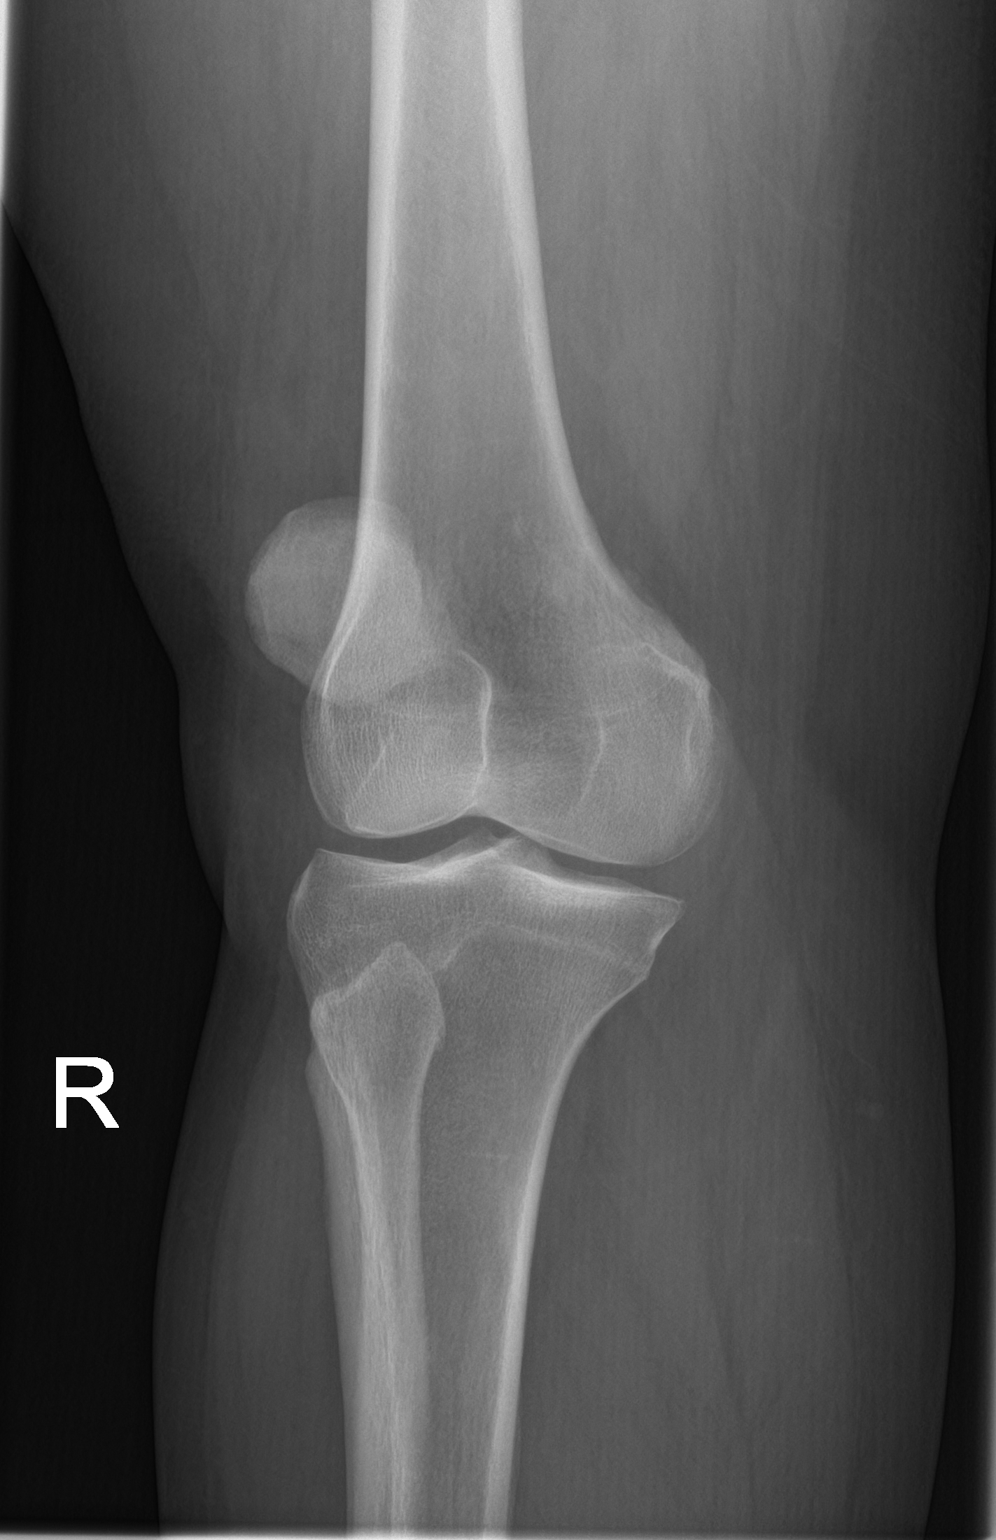
[im 2/4]
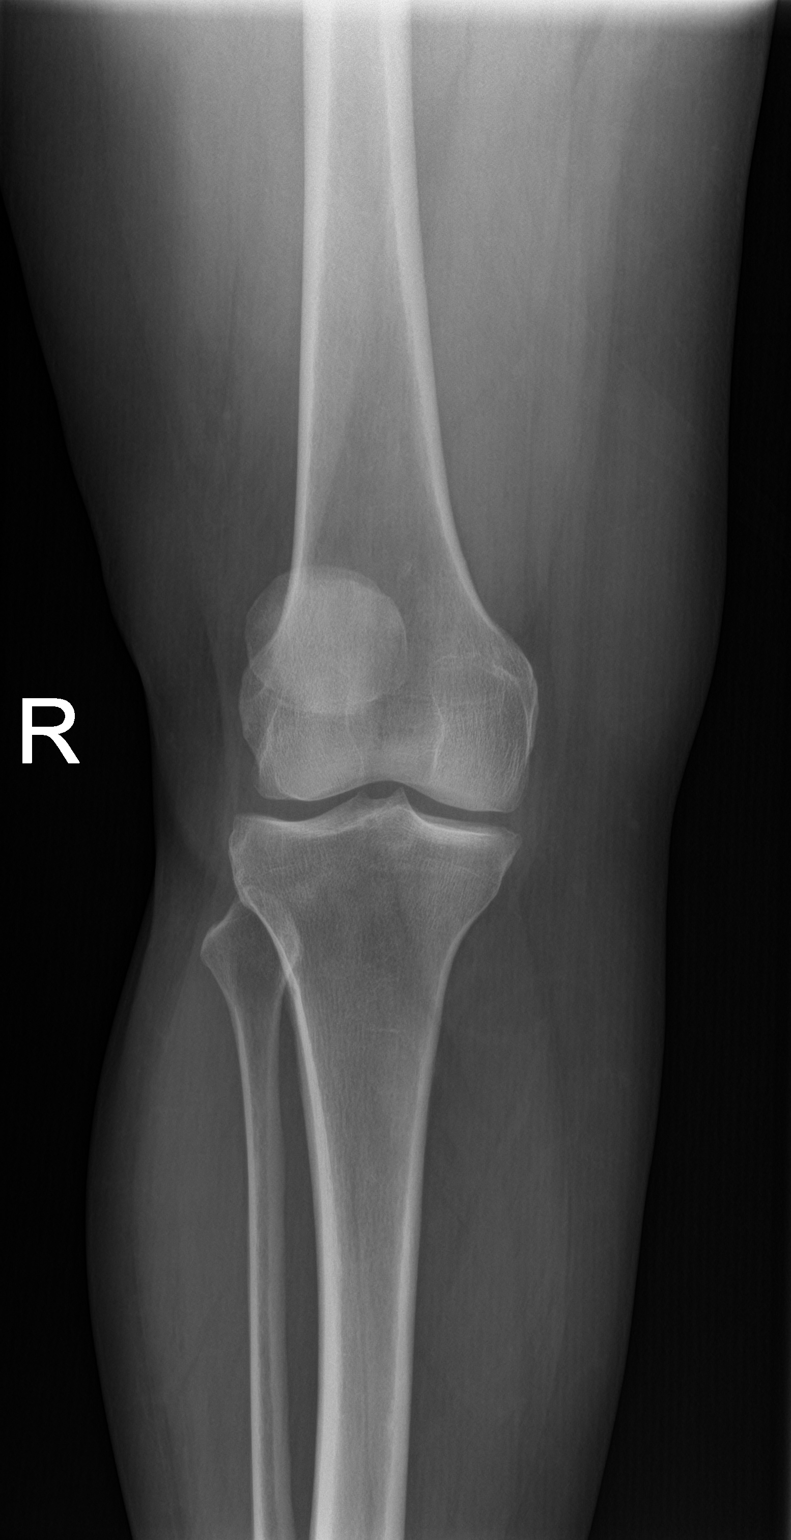
[im 3/4]
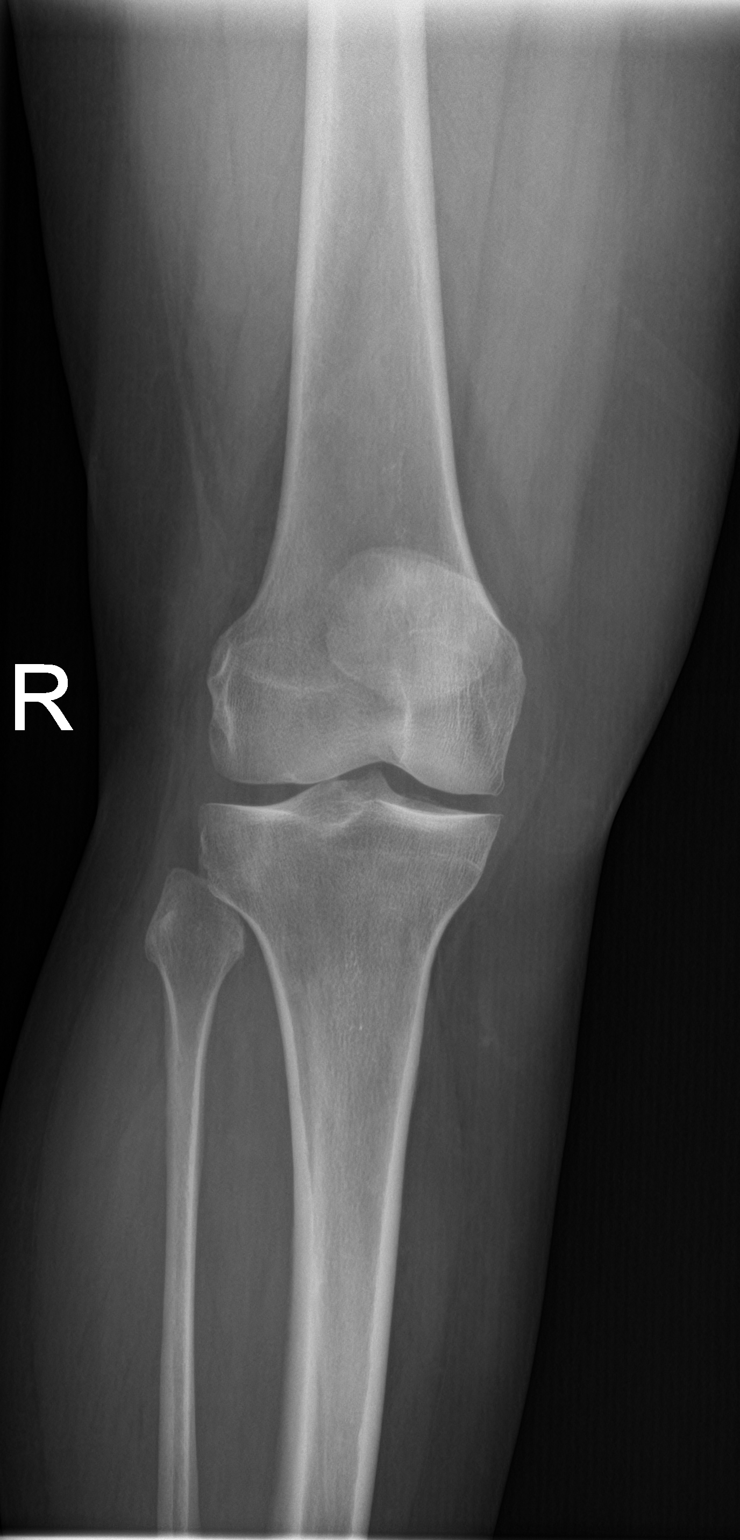
[im 4/4]
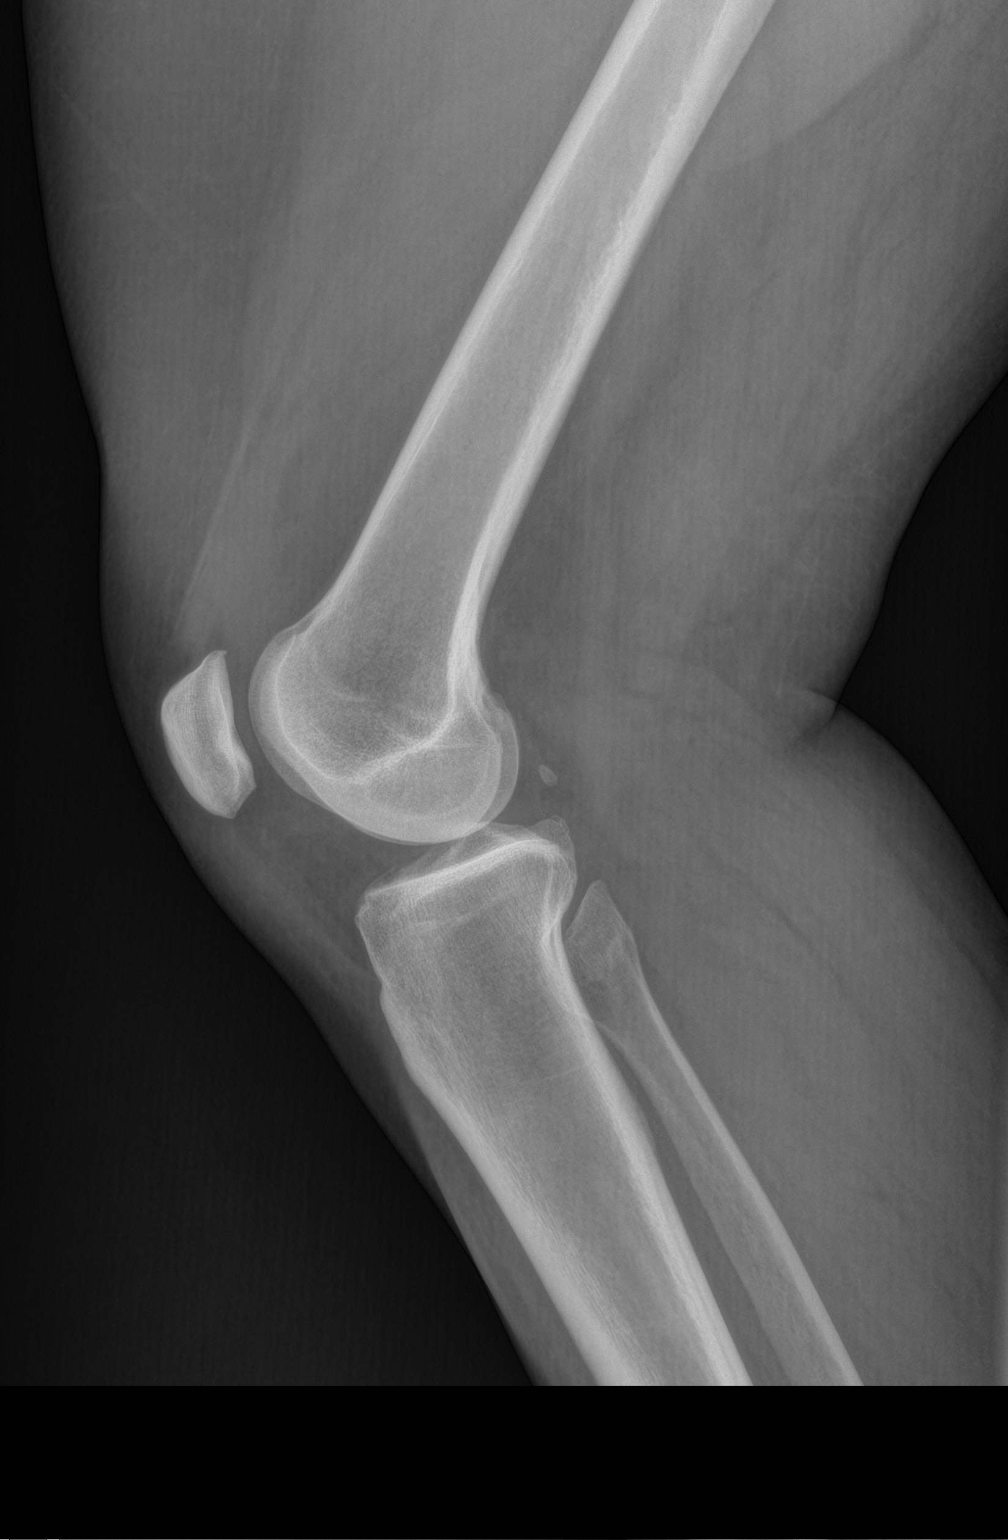

[4 of 4 positions shown; findings below may reference images not displayed]

FINDINGS: No evidence of fracture, dislocation, or joint effusion. No evidence
of arthropathy or other focal bone abnormality. Soft tissues are
unremarkable.
IMPRESSION: Normal right knee.

## 2019-04-26 IMAGING — CR DG CHEST 2V
1 series · 2 of 2 positions shown · non-contrast
Comparison: 06/26/2017

CLINICAL DATA: Vomiting, shortness of breath

EXAM:
CHEST  2 VIEW

[Series 1: dg chest 2 view · 0.14mm/px · 2 of 2 slices shown]
[im 1/2]
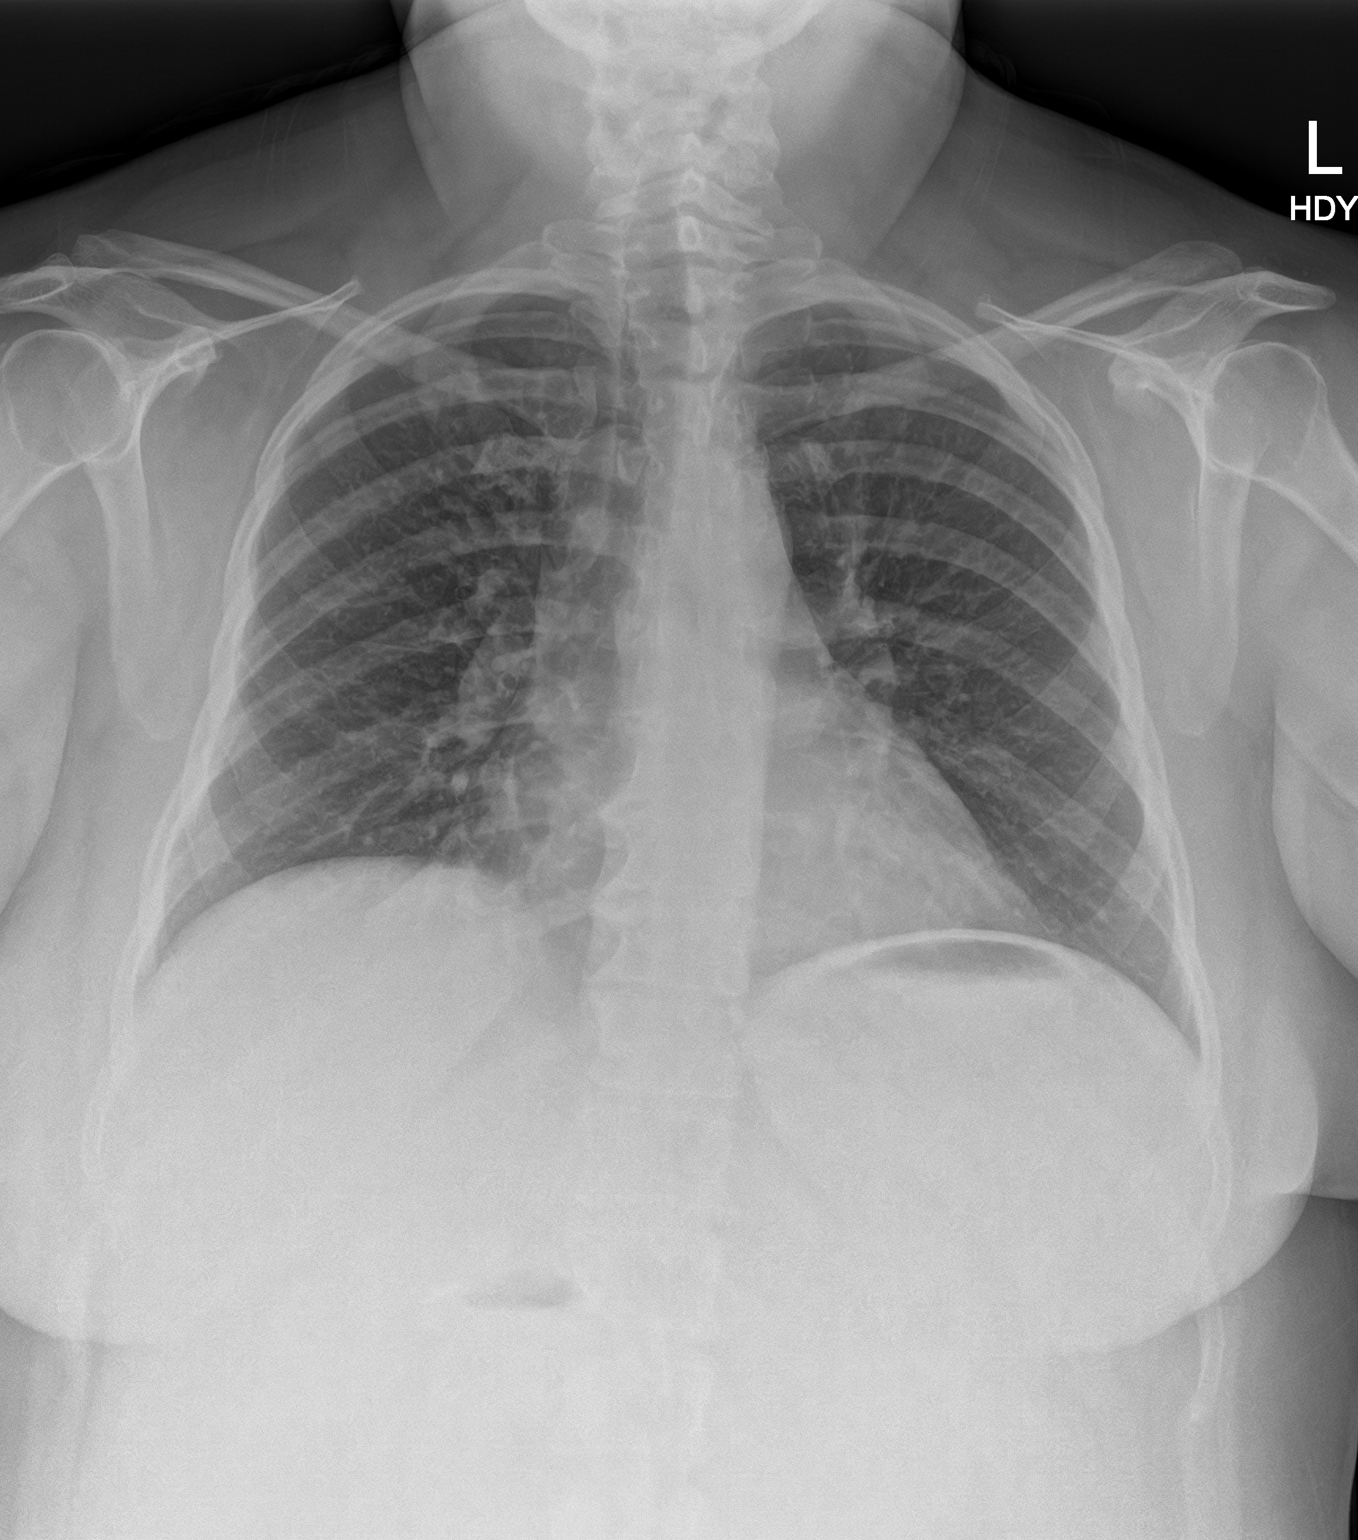
[im 2/2]
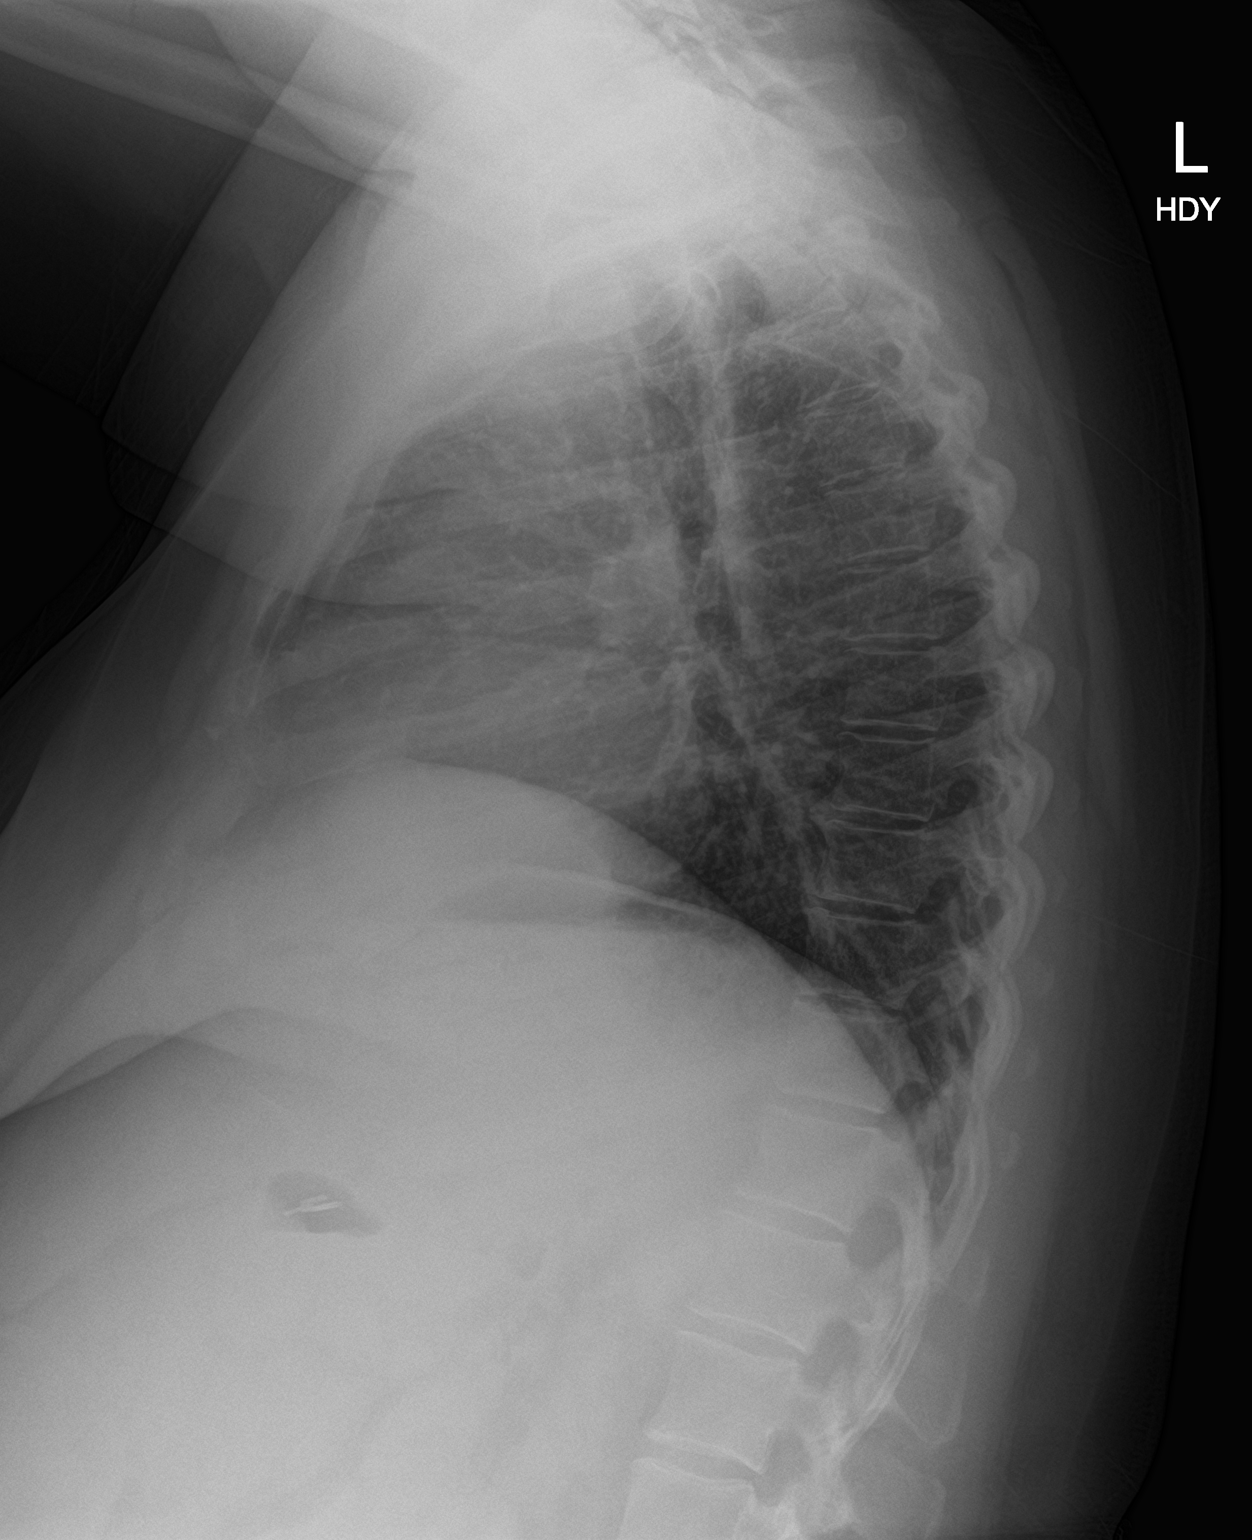

[2 of 2 positions shown; findings below may reference images not displayed]

FINDINGS: The heart size and mediastinal contours are within normal limits.
Both lungs are clear. The visualized skeletal structures are
unremarkable except for diffuse thoracic spondylosis. Remote
cholecystectomy. Trachea is midline.
IMPRESSION: No active cardiopulmonary disease.

## 2019-05-09 ENCOUNTER — Encounter: Payer: Self-pay | Admitting: Nurse Practitioner

## 2019-05-09 ENCOUNTER — Ambulatory Visit (INDEPENDENT_AMBULATORY_CARE_PROVIDER_SITE_OTHER): Payer: Medicare HMO | Admitting: Nurse Practitioner

## 2019-05-09 DIAGNOSIS — L259 Unspecified contact dermatitis, unspecified cause: Secondary | ICD-10-CM | POA: Diagnosis not present

## 2019-05-09 MED ORDER — TRIAMCINOLONE ACETONIDE 0.5 % EX CREA: 1.0000 "application " | TOPICAL_CREAM | Freq: Two times a day (BID) | CUTANEOUS | 0 refills | Status: DC

## 2019-05-09 NOTE — Progress Notes (Signed)
Virtual Visit via Video Note  I connected with [name] on 05/09/19 at 10:20 AM EDT by a video enabled telemedicine application and verified that I am speaking with the correct person using two identifiers.   Staff discussed the limitations of evaluation and management by telemedicine and the availability of in person appointments. The patient expressed understanding and agreed to proceed.  Patient location: home  My location: work office Other people present: daughterDesmond Yang   HPI Patient noticed rash started on her back and legs and has spread to stomach and arms. First noticed rash three days ago. Rash is painful and itchy.  Does go outside not around any poison oak or ivy. Lives in close quarters with family and no one else has rash.  She has tried benadryl. Hydrocortisone cream, calamine lotion- without relief.   Endorses headache- states she gets these occasionally though and this one feels like a sinus headache Denies fevers, chills, shortness of breath or cough, no tick bites, no nausea, vomiting, diarrhea, arthralgias.   PHQ2/9: Depression screen [facility] 2/9 04/09/2019 02/20/2019 11/22/2018 11/07/2018 10/29/2018  Decreased Interest 0 0 0 2 0  Down, Depressed, Hopeless 0 0 0 0 0  PHQ - 2 Score 0 0 0 2 0  Altered sleeping 0 0 0 2 0  Tired, decreased energy 0 0 0 2 0  Change in appetite 0 0 - 1 0  Feeling bad or failure about yourself  0 0 0 0 0  Trouble concentrating 0 0 0 1 0  Moving slowly or fidgety/restless 0 0 0 3 0  Suicidal thoughts 0 0 0 0 -  PHQ-9 Score 0 0 0 11 0  Difficult doing work/chores Not difficult at all Not difficult at all Not difficult at all Somewhat difficult Not difficult at all  Some recent data might be hidden    PHQ reviewed. Negative  Patient Active Problem List   Diagnosis Date Noted   Abscess of axilla, right 04/16/2019   Iron deficiency anemia due to chronic blood loss    Polyp of ascending colon    Chronic gastric ulcer with  hemorrhage    Type 2 diabetes with decreased circulation ([place]) 11/07/2018   Vitamin B12 deficiency 08/08/2018   Encounter for Medicare annual wellness exam 10/30/2017   Patient is full code 10/30/2017   Disorder of bursae of shoulder region 10/09/2017   Coronary artery disease 10/09/2017   Plantar fascial fibromatosis 10/09/2017   Tendinitis of wrist 10/09/2017   Schizoaffective disorder, bipolar type ([place]) 09/28/2017   Chest pain 09/10/2017   Pleuritic chest pain 05/27/2017   Hypertriglyceridemia 12/21/2016   Pansinusitis 11/15/2016   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 09/28/2016   Breast cancer screening 09/28/2016   Urine frequency 09/28/2016   Preventative health care 09/28/2016   Fatigue 05/01/2016   Urinary tract infection 03/14/2016   Adenomatous colon polyp 10/15/2015   Airway hyperreactivity 10/15/2015   Bipolar affective disorder ([place]) 10/15/2015   CN (constipation) 10/15/2015   CAFL (chronic airflow limitation) ([place]) 10/15/2015   Drug-induced hepatic toxicity 10/15/2015   Blood in feces 10/15/2015   HLD (hyperlipidemia) 10/15/2015   Hypoxemia 10/15/2015   NASH (nonalcoholic steatohepatitis) 10/15/2015   Morbid obesity ([place]) 10/15/2015   Restless leg syndrome 10/15/2015   Type 2 diabetes, uncontrolled, with neuropathy ([place]) 10/15/2015   Elevated alkaline phosphatase level 09/22/2015   Urinary bladder incontinence 09/22/2015   Low back pain with left-sided sciatica 08/18/2015   Encounter for monitoring tricyclic antidepressant  therapy 05/13/2015   Bipolar disorder ([place]) 05/11/2015   Liver disease 05/11/2015   Apnea, sleep 05/05/2015   Heart valve disease 05/05/2015   Chronic kidney disease, stage III (moderate) ([place]) 03/15/2015   Migraine 03/12/2015   COPD (chronic obstructive pulmonary disease) ([place]) 01/20/2015   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[place]) 01/20/2015   GERD (gastroesophageal reflux disease)  01/20/2015   Hypothyroid    Vitamin D deficiency    Urinary incontinence    Liver lesion    Hypomagnesemia    Obstructive sleep apnea 10/20/2013   Vaginitis 12/19/2011   PERIPHERAL NEUROPATHY 03/04/2009   HEMORRHOIDS, EXTERNAL 09/03/2008   ALLERGIC RHINITIS 07/04/2007   ASTHMA, PERSISTENT, MODERATE 07/04/2007   INTERSTITIAL CYSTITIS 07/04/2007   HYPERTENSION, BENIGN ESSENTIAL 06/19/2007    Past Medical History:  Diagnosis Date   Anxiety    Arthritis    Asthma    Bipolar depression ([place])    CHF (congestive heart failure) (HCC)    Chronic headaches    migraines have lessened   Chronic kidney disease    ddstage 111   COPD (chronic obstructive pulmonary disease) (HCC)    Coronary artery disease    Depression    Diabetic neuropathy (H[facility])    Dyspnea    uses inhalers for this   Dysrhythmia    rapid   Fatty liver    GERD (gastroesophageal reflux disease)    Headache    Heart attack ([place]) 2009   Hyperlipidemia    Hypertension    Hypomagnesemia    Hypothyroid    Irregular heart rhythm    Liver lesion    Favored to be benign on CT; MRI of abd w/contract in Aug 2016. Liver Biopsy-Negative, March 2016   Morbid obesity ([place])    OSA (obstructive sleep apnea)    Peripheral vascular disease (HCC)    Pinched nerve    Back   Pneumonia    RLS (restless legs syndrome)    Seasonal allergies    Sleep apnea    uses oxygen at night 2L d/t cpap did not fit   Type 2 diabetes, uncontrolled, with neuropathy ([place]) 10/15/2015   Overview:  Microalbumin 7.7 12/2010: Hgb A1c 10.2% 12/2010 and 9.7% 03/2011, eye exam 10/2009 [facility]    Urinary incontinence    Vitamin D deficiency     Past Surgical History:  Procedure Laterality Date   ABDOMINAL HYSTERECTOMY     ABDOMINAL HYSTERECTOMY     Partial   bladder stim  2007   BLADDER SURGERY     x 2. tacking of bladder and the other was the stimulator placement   CHOLECYSTECTOMY      COLONOSCOPY     COLONOSCOPY WITH PROPOFOL N/A 03/04/2019   Procedure: COLONOSCOPY WITH PROPOFOL;  Surgeon: [name], MD;  Location: [facility] ENDOSCOPY;  Service: Endoscopy;  Laterality: N/A;   ESOPHAGOGASTRODUODENOSCOPY (EGD) WITH PROPOFOL N/A 03/04/2019   Procedure: ESOPHAGOGASTRODUODENOSCOPY (EGD) WITH PROPOFOL;  Surgeon: [name], MD;  Location: [facility] ENDOSCOPY;  Service: Endoscopy;  Laterality: N/A;   INCISION AND DRAINAGE ABSCESS Right 04/16/2019   Procedure: INCISION AND DRAINAGE ABSCESS;  Surgeon: Benjamine Sprague, DO;  Location: [facility] ORS;  Service: General;  Laterality: Right;   INTERSTIM-GENERATOR CHANGE N/A 12/31/2017   Procedure: INTERSTIM-GENERATOR CHANGE;  Surgeon: Bjorn Loser, MD;  Location: [facility] ORS;  Service: Urology;  Laterality: N/A;   LIVER BIOPSY  March 2016   Negative   LUNG BIOPSY  2015   small spot on lung   TUBAL LIGATION  UPPER GI ENDOSCOPY      Social History   Tobacco Use   Smoking status: Never Smoker   Smokeless tobacco: Never Used  Substance Use Topics   Alcohol use: No     Current Outpatient Medications:    amoxicillin (AMOXIL) 500 MG capsule, Take 500 mg by mouth daily., Disp: , Rfl:    atorvastatin (LIPITOR) 80 MG tablet, Take 80 mg by mouth at bedtime. , Disp: , Rfl:    busPIRone (BUSPAR) 10 MG tablet, Take 10 mg by mouth 2 (two) times daily., Disp: , Rfl:    DULoxetine (CYMBALTA) 60 MG capsule, Take 120 mg by mouth daily., Disp: , Rfl:    empagliflozin (JARDIANCE) 25 MG TABS tablet, Take 25 mg by mouth daily., Disp: , Rfl:    ezetimibe (ZETIA) 10 MG tablet, Take 10 mg by mouth daily. , Disp: , Rfl:    fenofibrate micronized (LOFIBRA) 134 MG capsule, Take 134 mg by mouth daily., Disp: , Rfl:    gabapentin (NEURONTIN) 300 MG capsule, Take 1 tablet each morning and 2 tablets at bedtime., Disp: 270 capsule, Rfl: 1   HUMULIN R U-500 KWIKPEN 500 UNIT/ML kwikpen, Inject 60 Units as directed 3 (three) times daily. , Disp: , Rfl:      insulin aspart (NOVOLOG) 100 UNIT/ML injection, Inject 5-25 Units into the skin 3 (three) times daily before meals. , Disp: , Rfl:    lamoTRIgine (LAMICTAL) 200 MG tablet, Take 200 mg by mouth 2 (two) times daily. , Disp: , Rfl:    levothyroxine (SYNTHROID) 50 MCG tablet, 1 TAB daily three days a week,  1.5 TABS four days a week (Mon, Wed, Fri, Sat for example), Disp: 108 tablet, Rfl: 1   lisinopril (PRINIVIL,ZESTRIL) 2.5 MG tablet, Take 1 tablet (2.5 mg total) by mouth daily., Disp: 90 tablet, Rfl: 1   metoprolol tartrate (LOPRESSOR) 25 MG tablet, Take one tablet 25 mg in the am and 1/2 tab in the pm, Disp: 135 tablet, Rfl: 0   Omega-3 Fatty Acids (FISH OIL PO), Take by mouth daily., Disp: , Rfl:    ondansetron (ZOFRAN) 4 MG tablet, Take 1 tablet (4 mg total) by mouth every 8 (eight) hours as needed for nausea or vomiting., Disp: 20 tablet, Rfl: 2   pantoprazole (PROTONIX) 40 MG tablet, Take 1 tablet (40 mg total) by mouth daily., Disp: 90 tablet, Rfl: 3   QUEtiapine (SEROQUEL) 400 MG tablet, Take 400 mg by mouth at bedtime., Disp: , Rfl:    traMADol (ULTRAM) 50 MG tablet, Take 1 tablet (50 mg total) by mouth every 6 (six) hours as needed (pain)., Disp: 20 tablet, Rfl: 0   traZODone (DESYREL) 100 MG tablet, Take 100 mg by mouth at bedtime., Disp: , Rfl:    TRULICITY 1.5 ZO/1.0RU SOPN, Inject 1.5 mLs as directed every Sunday. , Disp: , Rfl:   Allergies  Allergen Reactions   Mirtazapine Anaphylaxis    REACTION: closes throat   Morphine And Related     anaphylaxis   Sulfamethoxazole-Trimethoprim Rash    REACTION: Whelps all over   Amoxicillin Er Nausea And Vomiting    Has patient had a PCN reaction causing immediate rash, facial/tongue/throat swelling, SOB or lightheadedness with hypotension: No Has patient had a PCN reaction causing severe rash involving mucus membranes or skin necrosis: No Has patient had a PCN reaction that required hospitalization: No Has patient had a  PCN reaction occurring within the last 10 years: Yes If all of the above answers  are "NO", then may proceed with Cephalosporin use.   Codeine Itching    REACTION: itch, rash   Hydrocodone-Acetaminophen Rash    REACTION: rash   Keflex [Cephalexin] Rash   Prasterone Nausea And Vomiting    ROS   No other specific complaints in a complete review of systems (except as listed in HPI above).  Objective  There were no vitals filed for this visit.   There is no height or weight on file to calculate BMI.  Nursing Note and Vital Signs reviewed.  Physical Exam Skin:          Constitutional: Patient appears well-developed and well-nourished. No distress.  HENT: Head: Normocephalic and atraumatic. Pulmonary/Chest: Effort normal  Psychiatric: Patient has a normal mood and affect. behavior is normal. Judgment and thought content normal.    Assessment & Plan  1. Contact dermatitis, unspecified contact dermatitis type, unspecified trigger Somewhat appearance of bites- will look for bed bugs, does not appear like scabies- no one else in family has it. No tick bites or concerning associated symptoms. Will trial steroid cream- if unimproved in 1-2 weeks or worsening at any time will follow-up  - triamcinolone cream (KENALOG) 0.5 %; Apply 1 application topically 2 (two) times daily. Do not use on face of genital area  Dispense: 60 g; Refill: 0   Follow Up Instructions:    I discussed the assessment and treatment plan with the patient. The patient was provided an opportunity to ask questions and all were answered. The patient agreed with the plan and demonstrated an understanding of the instructions.   The patient was advised to call back or seek an in-person evaluation if the symptoms worsen or if the condition fails to improve as anticipated.  I provided 11 minutes of non-face-to-face time during this encounter.   Fredderick Severance, NP

## 2019-05-13 IMAGING — CR DG CHEST 2V
1 series · 2 of 2 positions shown · non-contrast
Comparison: 09/22/2017

CLINICAL DATA: Pt states she tested positive for flu A today. Pt
has cough, congestion, fever, body aches and headache for 2-3 days.
History of CHF, heart attack, HTN, pneumonia.

EXAM:
CHEST  2 VIEW

[Series 1: dg chest 2 view · 0.14mm/px · 2 of 2 slices shown]
[im 1/2]
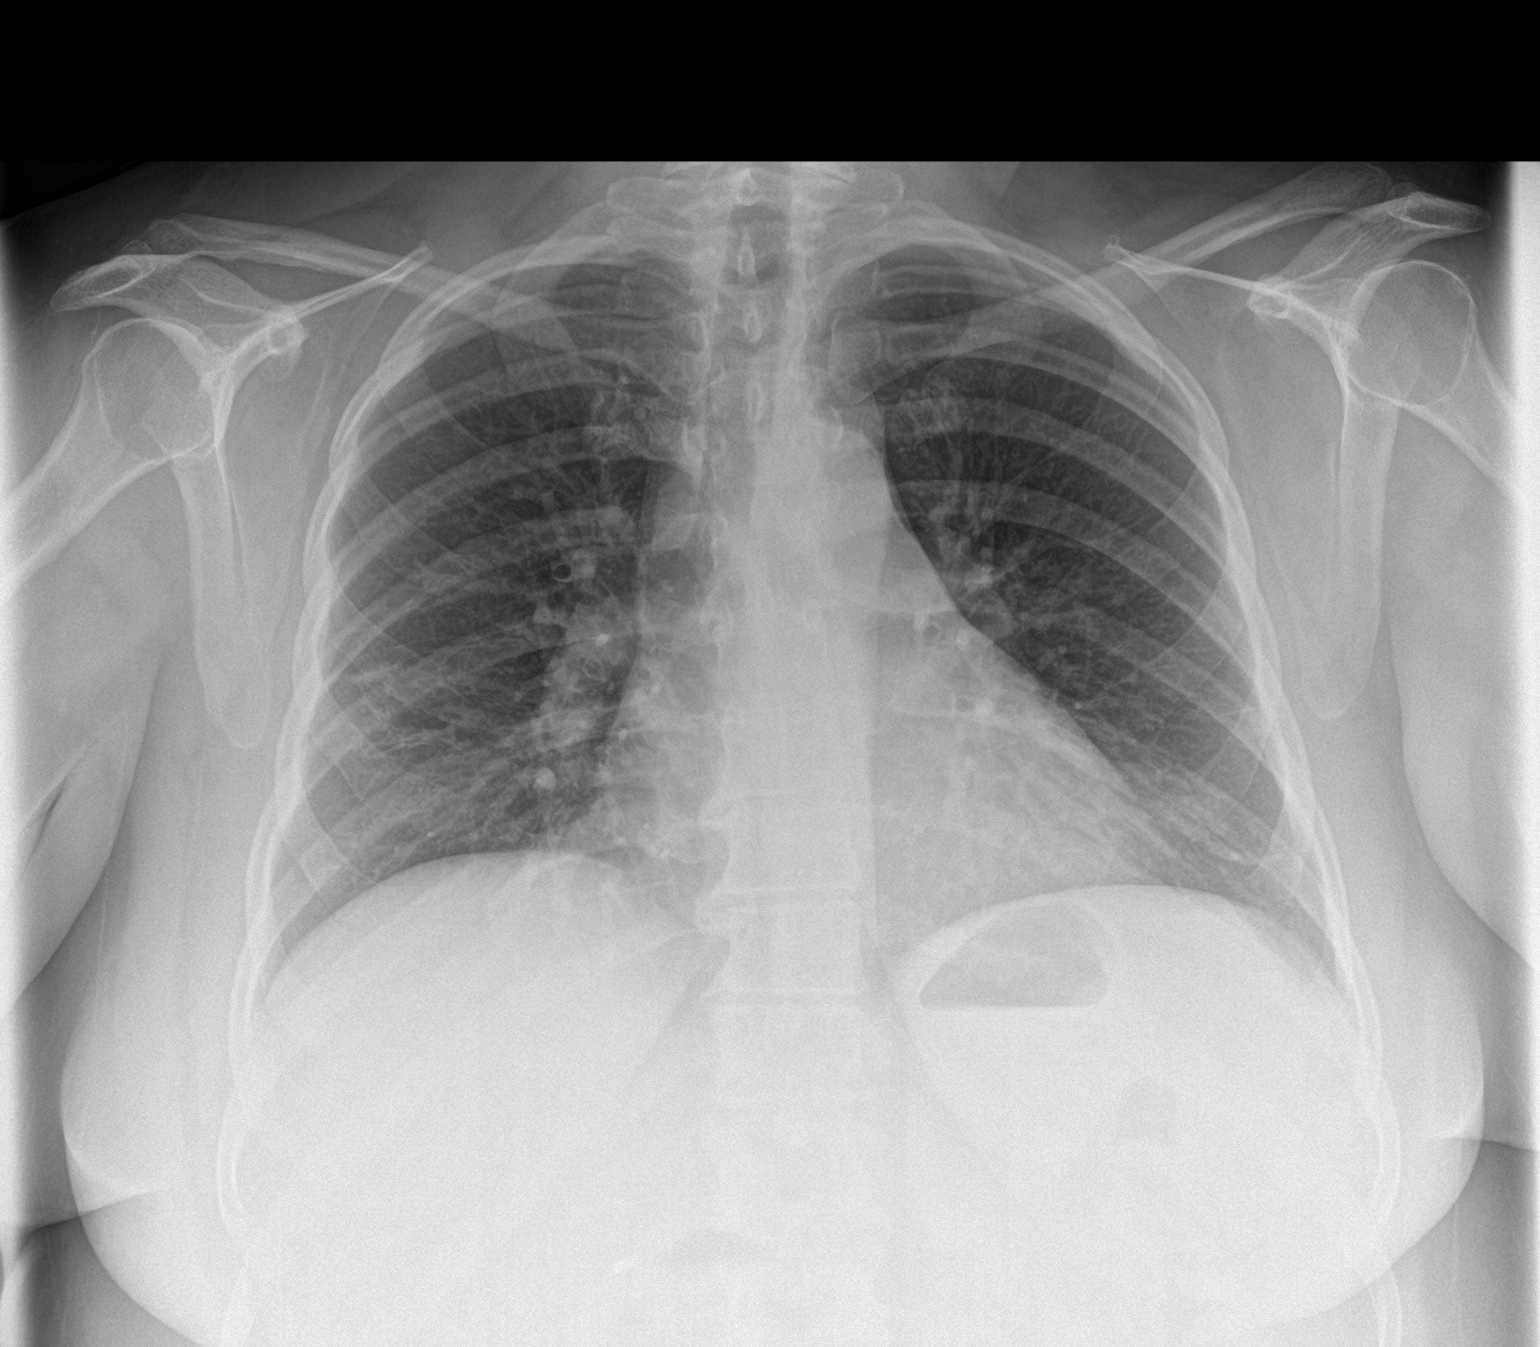
[im 2/2]
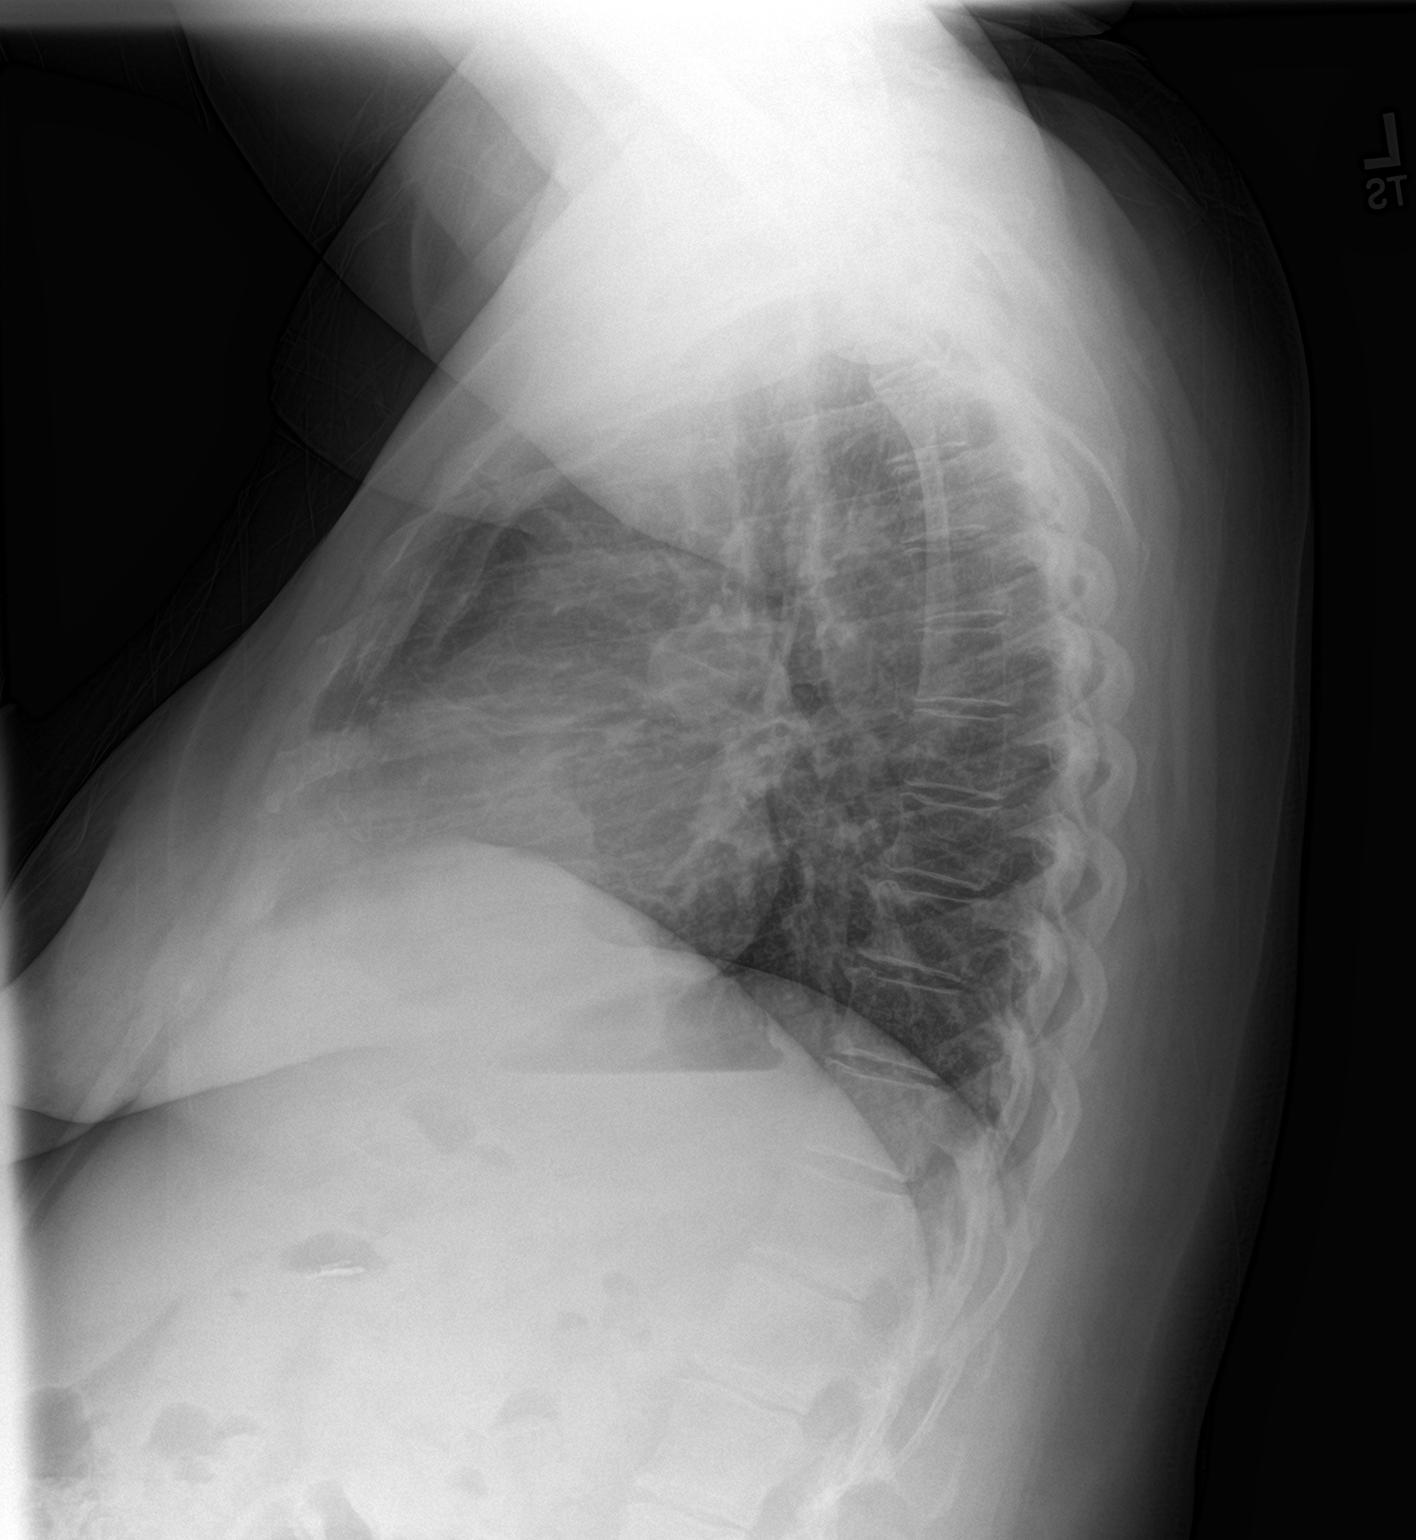

[2 of 2 positions shown; findings below may reference images not displayed]

FINDINGS: Minimal linear/reticular opacity in the right middle lobe is likely
due to atelectasis or scarring. Lungs otherwise clear.

No pleural effusion or pneumothorax.

Cardiac silhouette top-normal in size. No mediastinal or hilar
masses or evidence of adenopathy.

Skeletal structures are intact.
IMPRESSION: No active cardiopulmonary disease.

## 2019-05-22 ENCOUNTER — Ambulatory Visit: Payer: Medicare HMO | Admitting: Gastroenterology

## 2019-05-27 ENCOUNTER — Other Ambulatory Visit: Payer: Self-pay

## 2019-05-27 DIAGNOSIS — I10 Essential (primary) hypertension: Secondary | ICD-10-CM

## 2019-05-27 MED ORDER — METOPROLOL TARTRATE 25 MG PO TABS: ORAL_TABLET | ORAL | 0 refills | Status: DC

## 2019-06-12 ENCOUNTER — Ambulatory Visit: Payer: Medicare HMO

## 2019-06-12 ENCOUNTER — Other Ambulatory Visit: Payer: Self-pay

## 2019-06-30 ENCOUNTER — Ambulatory Visit: Payer: Medicare HMO | Admitting: Gastroenterology

## 2019-07-01 IMAGING — CR DG ABDOMEN 1V
1 series · 2 of 2 positions shown · non-contrast
Comparison: CT abdomen pelvis of 07/14/2017

CLINICAL DATA: Placement of bladder stimulator in 0520, evaluate
placement with current urinary incontinence

EXAM:
ABDOMEN - 1 VIEW

[Series 1: dg abd 1 view · 0.14mm/px · 2 of 2 slices shown]
[im 1/2]
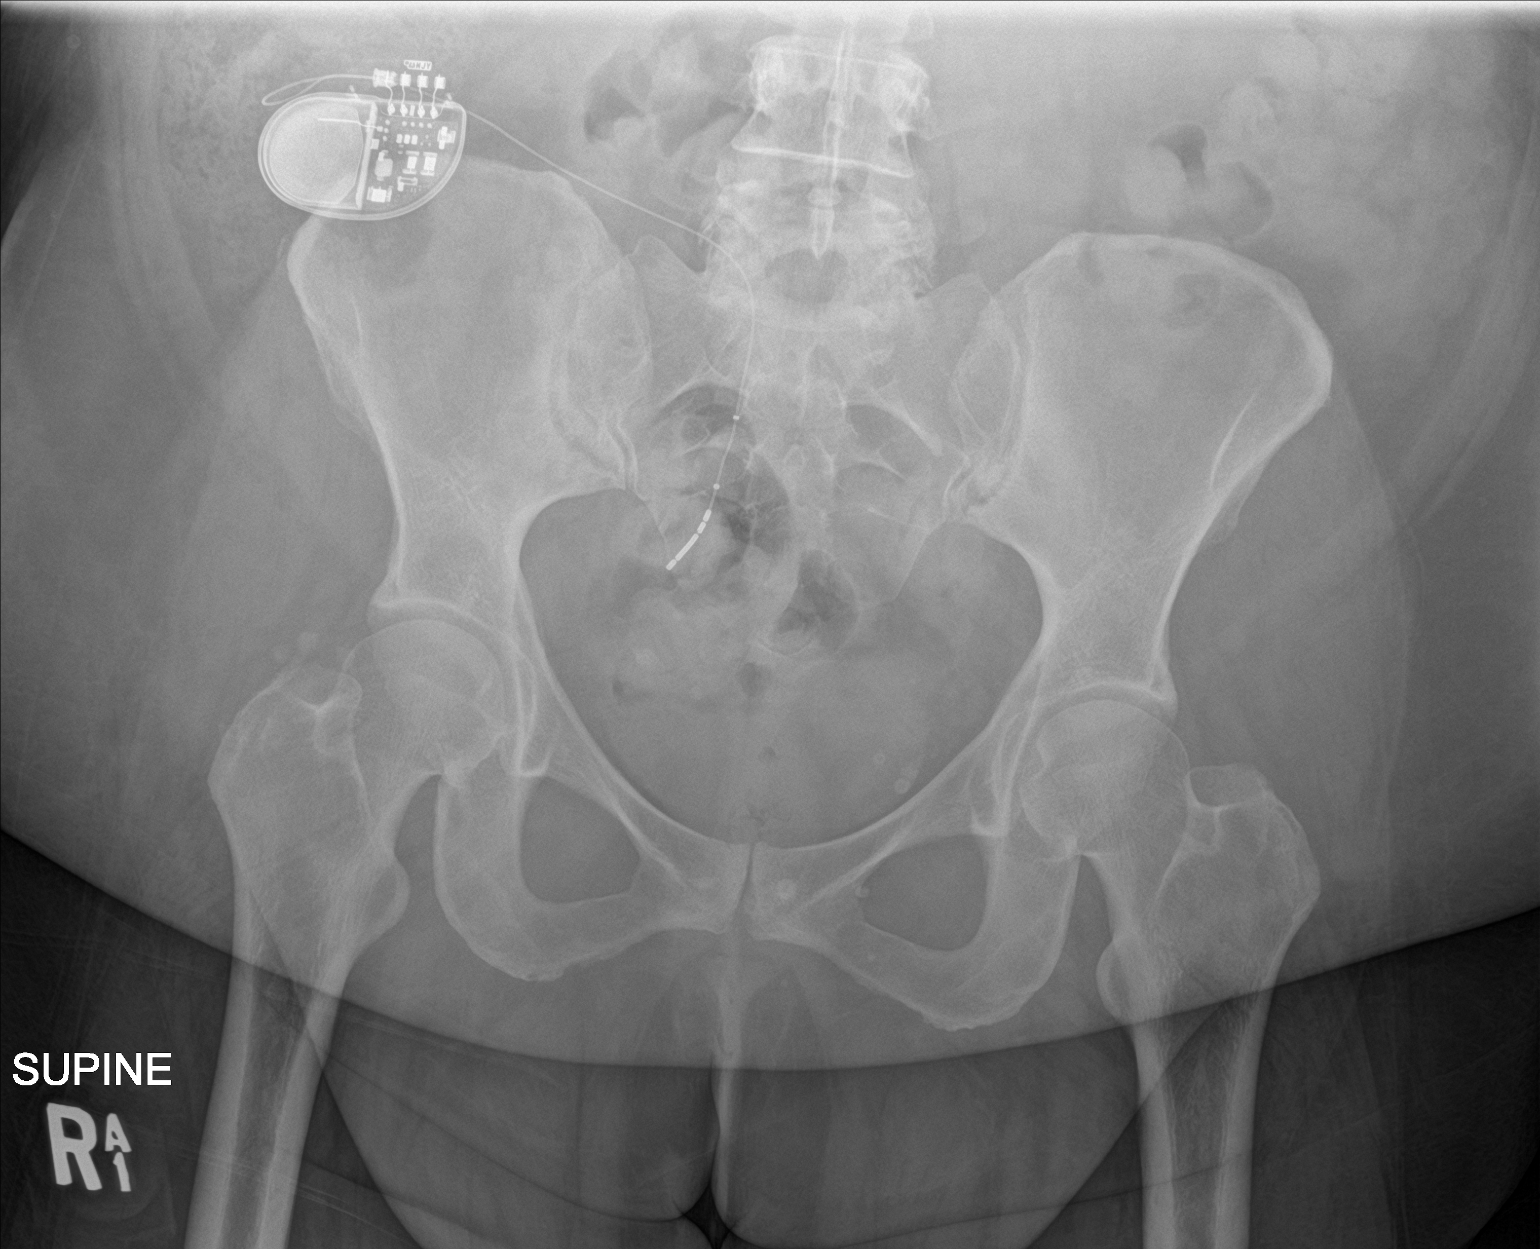
[im 2/2]
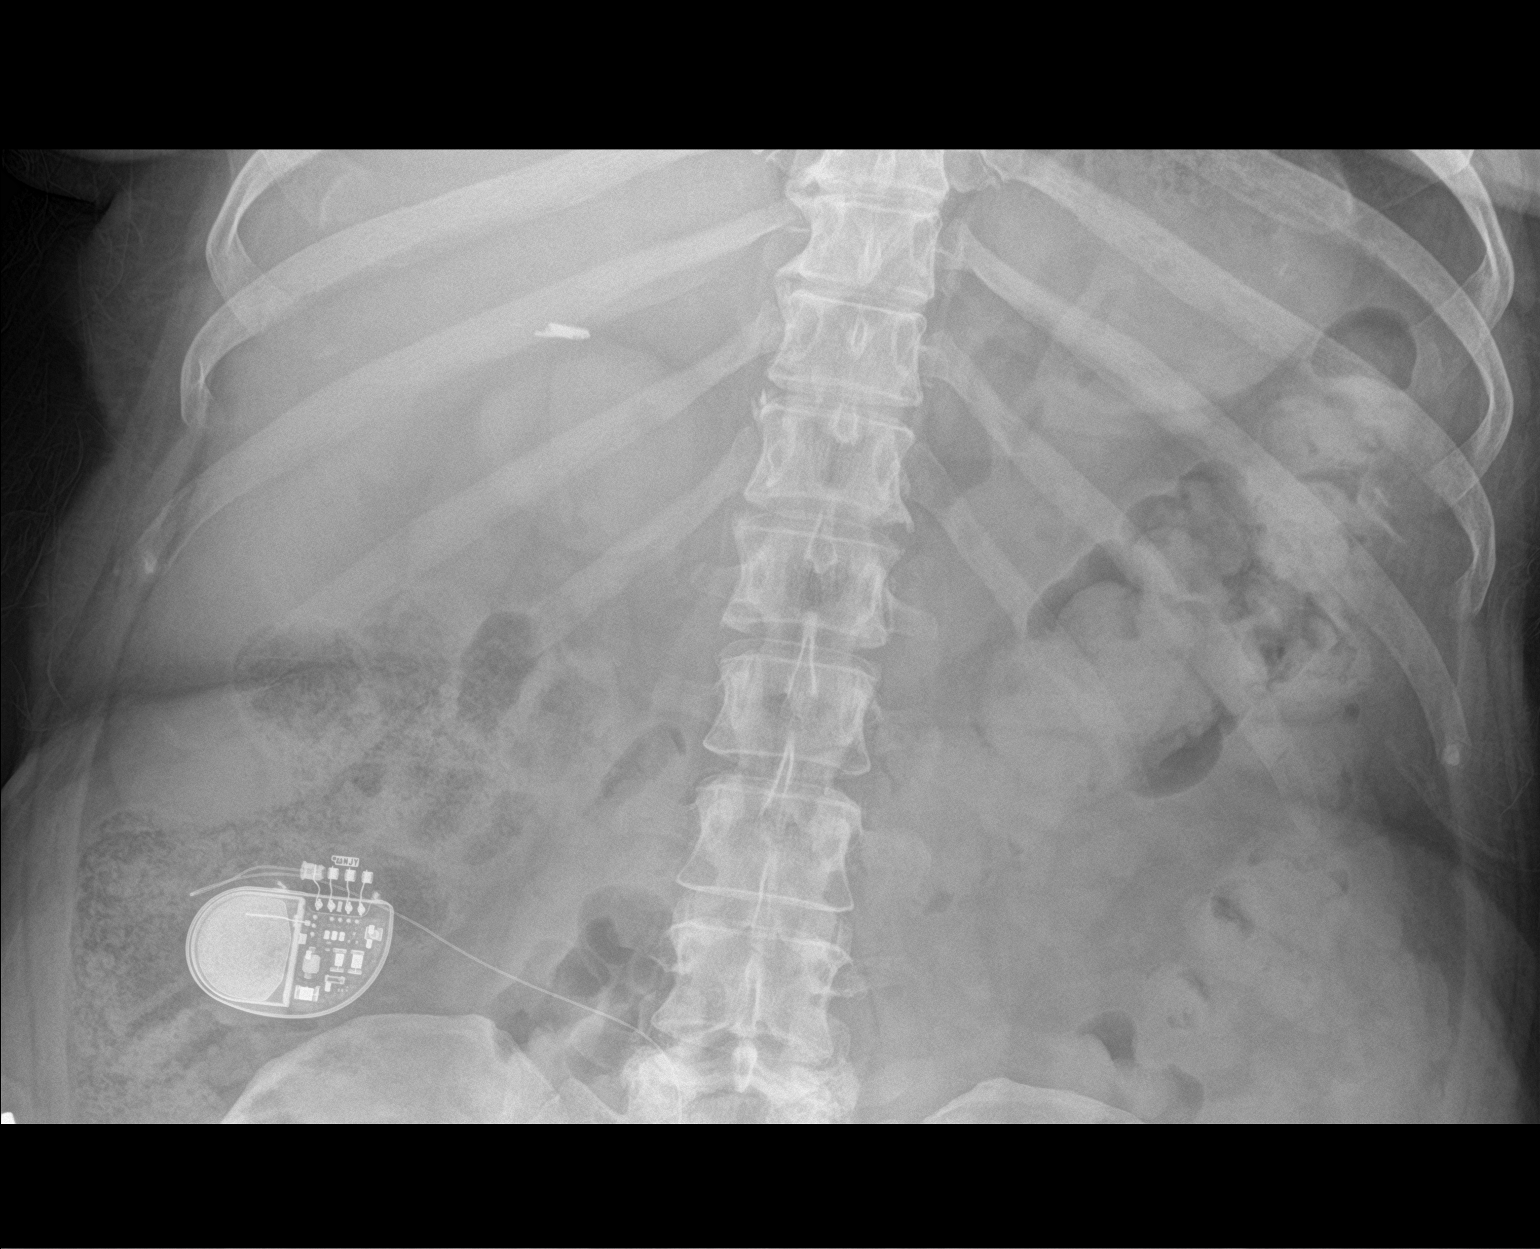

[2 of 2 positions shown; findings below may reference images not displayed]

FINDINGS: The bladder stimulator electrode lead overlies the right lateral
sacrum. Compared to the images from the CT of 5179, this lead
appears unchanged in position and does appear to be continuous.
Moderate amount of feces is noted throughout the colon. No opaque
calculi are seen. Surgical clips are present from prior
cholecystectomy.
IMPRESSION: 1. No change in position of bladder stimulator lead overlying the
lateral right sacrum.
2. Moderate amount of feces throughout the colon.

## 2019-07-08 ENCOUNTER — Other Ambulatory Visit: Payer: Self-pay | Admitting: Family Medicine

## 2019-07-25 ENCOUNTER — Encounter: Payer: Self-pay | Admitting: Intensive Care

## 2019-07-25 ENCOUNTER — Emergency Department
Admission: EM | Admit: 2019-07-25 | Discharge: 2019-07-25 | Disposition: A | Discharge status: Home / Self Care | Payer: Medicare HMO | Attending: Emergency Medicine | Admitting: Emergency Medicine

## 2019-07-25 ENCOUNTER — Other Ambulatory Visit: Payer: Self-pay

## 2019-07-25 DIAGNOSIS — N183 Chronic kidney disease, stage 3 unspecified: Secondary | ICD-10-CM | POA: Diagnosis not present

## 2019-07-25 DIAGNOSIS — M654 Radial styloid tenosynovitis [de Quervain]: Secondary | ICD-10-CM | POA: Diagnosis not present

## 2019-07-25 DIAGNOSIS — E039 Hypothyroidism, unspecified: Secondary | ICD-10-CM | POA: Insufficient documentation

## 2019-07-25 DIAGNOSIS — I130 Hypertensive heart and chronic kidney disease with heart failure and stage 1 through stage 4 chronic kidney disease, or unspecified chronic kidney disease: Secondary | ICD-10-CM | POA: Insufficient documentation

## 2019-07-25 DIAGNOSIS — J449 Chronic obstructive pulmonary disease, unspecified: Secondary | ICD-10-CM | POA: Diagnosis not present

## 2019-07-25 DIAGNOSIS — Z79899 Other long term (current) drug therapy: Secondary | ICD-10-CM | POA: Insufficient documentation

## 2019-07-25 DIAGNOSIS — I509 Heart failure, unspecified: Secondary | ICD-10-CM | POA: Diagnosis not present

## 2019-07-25 DIAGNOSIS — M25531 Pain in right wrist: Secondary | ICD-10-CM | POA: Diagnosis present

## 2019-07-25 DIAGNOSIS — E119 Type 2 diabetes mellitus without complications: Secondary | ICD-10-CM | POA: Diagnosis not present

## 2019-07-25 DIAGNOSIS — J45909 Unspecified asthma, uncomplicated: Secondary | ICD-10-CM | POA: Insufficient documentation

## 2019-07-25 DIAGNOSIS — I2510 Atherosclerotic heart disease of native coronary artery without angina pectoris: Secondary | ICD-10-CM | POA: Diagnosis not present

## 2019-07-25 DIAGNOSIS — Z794 Long term (current) use of insulin: Secondary | ICD-10-CM | POA: Insufficient documentation

## 2019-07-25 MED ORDER — TRAMADOL HCL 50 MG PO TABS
50.0000 mg | ORAL_TABLET | Freq: Once | ORAL | Status: AC
  Administered 2019-07-25: 21:00:00 50 mg via ORAL
  Filled 2019-07-25: qty 1

## 2019-07-25 MED ORDER — TRAMADOL HCL 50 MG PO TABS: 50.0000 mg | ORAL_TABLET | Freq: Four times a day (QID) | ORAL | 0 refills | Status: DC | PRN

## 2019-07-25 MED ORDER — DEXAMETHASONE SODIUM PHOSPHATE 10 MG/ML IJ SOLN
10.0000 mg | Freq: Once | INTRAMUSCULAR | Status: AC
  Administered 2019-07-25: 10 mg via INTRAMUSCULAR
  Filled 2019-07-25: qty 1

## 2019-07-25 MED ORDER — PREDNISONE 10 MG PO TABS: 10.0000 mg | ORAL_TABLET | Freq: Every day | ORAL | 0 refills | Status: DC

## 2019-07-25 NOTE — ED Provider Notes (Signed)
[facility] Emergency Department Provider Note  ____________________________________________  Time seen: Approximately 8:17 PM  I have reviewed the triage vital signs and the nursing notes.   HISTORY  Chief Complaint Wrist Pain (right)    HPI [name] is a 53 y.o. female who presents the emergency department complaining of right thumb pain extending into the wrist.  Patient reports that she was awoken last night with pain along being found extending into the wrist.  Patient reports that she had tendinitis in the past and this feels similar.  It has been "years" since last episode of tendinitis.  Patient has had no direct trauma to the thumb or wrist.  No erythema, edema.  Patient has not tried any medications at home.  No other complaints at this time.         Past Medical History:  Diagnosis Date  . Anxiety   . Arthritis   . Asthma   . Bipolar depression ([place])   . CHF (congestive heart failure) ([place])   . Chronic headaches    migraines have lessened  . Chronic kidney disease    ddstage 111  . COPD (chronic obstructive pulmonary disease) ([place])   . Coronary artery disease   . Depression   . Diabetic neuropathy ([place])   . Dyspnea    uses inhalers for this  . Dysrhythmia    rapid  . Fatty liver   . GERD (gastroesophageal reflux disease)   . Headache   . Heart attack ([place]) 2009  . Hyperlipidemia   . Hypertension   . Hypomagnesemia   . Hypothyroid   . Irregular heart rhythm   . Liver lesion    Favored to be benign on CT; MRI of abd w/contract in Aug 2016. Liver Biopsy-Negative, March 2016  . Morbid obesity ([place])   . OSA (obstructive sleep apnea)   . Peripheral vascular disease ([place])   . Pinched nerve    Back  . Pneumonia   . RLS (restless legs syndrome)   . Seasonal allergies   . Sleep apnea    uses oxygen at night 2L d/t cpap did not fit  . Type 2 diabetes, uncontrolled, with neuropathy ([place]) 10/15/2015   Overview:  Microalbumin 7.7  12/2010: Hgb A1c 10.2% 12/2010 and 9.7% 03/2011, eye exam 10/2009 [facility] DM clinic   . Urinary incontinence   . Vitamin D deficiency     Patient Active Problem List   Diagnosis Date Noted  . Abscess of axilla, right 04/16/2019  . Iron deficiency anemia due to chronic blood loss   . Polyp of ascending colon   . Chronic gastric ulcer with hemorrhage   . Type 2 diabetes with decreased circulation ([place]) 11/07/2018  . Vitamin B12 deficiency 08/08/2018  . Encounter for Medicare annual wellness exam 10/30/2017  . Patient is full code 10/30/2017  . Disorder of bursae of shoulder region 10/09/2017  . Coronary artery disease 10/09/2017  . Plantar fascial fibromatosis 10/09/2017  . Tendinitis of wrist 10/09/2017  . Schizoaffective disorder, bipolar type ([place]) 09/28/2017  . Chest pain 09/10/2017  . Pleuritic chest pain 05/27/2017  . Hypertriglyceridemia 12/21/2016  . Pansinusitis 11/15/2016  . 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 Breast cancer screening 09/28/2016  . Urine frequency 09/28/2016  . Preventative health care 09/28/2016  . Fatigue 05/01/2016  . Urinary tract infection 03/14/2016  . Adenomatous colon polyp 10/15/2015  . Airway hyperreactivity 10/15/2015  . Bipolar affective disorder ([place]) 10/15/2015  .  CN (constipation) 10/15/2015  . CAFL (chronic airflow limitation) ([place]) 10/15/2015  . Drug-induced hepatic toxicity 10/15/2015  . Blood in feces 10/15/2015  . HLD (hyperlipidemia) 10/15/2015  . Hypoxemia 10/15/2015  . NASH (nonalcoholic steatohepatitis) 10/15/2015  . Morbid obesity ([place]) 10/15/2015  . Restless leg syndrome 10/15/2015  . Type 2 diabetes, uncontrolled, with neuropathy ([place]) 10/15/2015  . Elevated alkaline phosphatase level 09/22/2015  . Urinary bladder incontinence 09/22/2015  . Low back pain with left-sided sciatica 08/18/2015  . Encounter for monitoring tricyclic antidepressant therapy 05/13/2015  . Bipolar disorder ([place]) 05/11/2015  .  Liver disease 05/11/2015  . Apnea, sleep 05/05/2015  . Heart valve disease 05/05/2015  . Chronic kidney disease, stage III (moderate) 03/15/2015  . Migraine 03/12/2015  . COPD (chronic obstructive pulmonary disease) ([place]) 01/20/2015  . 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 GERD (gastroesophageal reflux disease) 01/20/2015  . Hypothyroid   . Vitamin D deficiency   . Urinary incontinence   . Liver lesion   . Hypomagnesemia   . Obstructive sleep apnea 10/20/2013  . Vaginitis 12/19/2011  . PERIPHERAL NEUROPATHY 03/04/2009  . HEMORRHOIDS, EXTERNAL 09/03/2008  . ALLERGIC RHINITIS 07/04/2007  . ASTHMA, PERSISTENT, MODERATE 07/04/2007  . INTERSTITIAL CYSTITIS 07/04/2007  . HYPERTENSION, BENIGN ESSENTIAL 06/19/2007    Past Surgical History:  Procedure Laterality Date  . ABDOMINAL HYSTERECTOMY    . ABDOMINAL HYSTERECTOMY     Partial  . bladder stim  2007  . BLADDER SURGERY     x 2. tacking of bladder and the other was the stimulator placement  . CHOLECYSTECTOMY    . COLONOSCOPY    . COLONOSCOPY WITH PROPOFOL N/A 03/04/2019   Procedure: COLONOSCOPY WITH PROPOFOL;  Surgeon: [name], MD;  [facility] END[facility]COPY;  Service: Endoscopy;  Laterality: N/A;  . ESOPHAGOGASTRODUODENOSCOPY (EGD) WITH PROPOFOL N/A 03/04/2019   Procedure: ESOPHAGOGASTRODUODENOSCOPY (EGD) WITH PROPOFOL;  Surgeon: [name], MD;  Location: [facility] ENDOSCOPY;  Service: Endoscopy;  Laterality: N/A;  . INCISION AND DRAINAGE ABSCESS Right 04/16/2019   Procedure: INCISION AND DRAINAGE ABSCESS;  Surgeon: Benjamine Sprague, DO;  Location: [facility] ORS;  Service: General;  Laterality: Right;  . INTERSTIM-GENERATOR CHANGE N/A 12/31/2017   Procedure: INTERSTIM-GENERATOR CHANGE;  Surgeon: Bjorn Loser, MD;  Location: [facility] ORS;  Service: Urology;  Laterality: N/A;  . LIVER BIOPSY  March 2016   Negative  . LUNG BIOPSY  2015   small spot on lung  . TUBAL LIGATION    . UPPER GI ENDOSCOPY      Prior to Admission  medications   Medication Sig Start Date End Date Taking? Authorizing Provider  amoxicillin (AMOXIL) 500 MG capsule Take 500 mg by mouth daily.    [provider]  atorvastatin (LIPITOR) 80 MG tablet Take 80 mg by mouth at bedtime.     [provider]  busPIRone (BUSPAR) 10 MG tablet Take 10 mg by mouth 2 (two) times daily.    [provider]  DULoxetine (CYMBALTA) 60 MG capsule Take 120 mg by mouth daily.    [provider]  empagliflozin (JARDIANCE) 25 MG TABS tablet Take 25 mg by mouth daily.    [provider]  ezetimibe (ZETIA) 10 MG tablet Take 10 mg by mouth daily.  01/05/17   [provider]  fenofibrate micronized (LOFIBRA) 134 MG capsule Take 134 mg by mouth daily.    [provider]  gabapentin (NEURONTIN) 300 MG capsule Take 1 tablet each morning and 2 tablets at bedtime. 04/09/19   Uvaldo Rising,  Astrid Divine, FNP  HUMULIN R U-500 KWIKPEN 500 UNIT/ML kwikpen Inject 60 Units as directed 3 (three) times daily.     [provider]  insulin aspart (NOVOLOG) 100 UNIT/ML injection Inject 5-25 Units into the skin 3 (three) times daily before meals.     [provider]  lamoTRIgine (LAMICTAL) 200 MG tablet Take 200 mg by mouth 2 (two) times daily.     [provider]  levothyroxine (SYNTHROID) 50 MCG tablet 1 TAB daily three days a week,  1.5 TABS four days a week (Mon, Wed, Fri, Sat for example) 02/25/19   Fredderick Severance, NP  lisinopril (ZESTRIL) 2.5 MG tablet TAKE 1 TABLET BY MOUTH EVERY DAY 07/08/19   Hubbard Hartshorn, FNP  metoprolol tartrate (LOPRESSOR) 25 MG tablet Take one tablet 25 mg in the am and 1/2 tab in the pm 05/27/19   Steele Sizer, MD  Omega-3 Fatty Acids (FISH OIL PO) Take by mouth daily.    [provider]  ondansetron (ZOFRAN) 4 MG tablet Take 1 tablet (4 mg total) by mouth every 8 (eight) hours as needed for nausea or vomiting. 02/05/19   [name], MD  pantoprazole (PROTONIX) 40 MG  tablet Take 1 tablet (40 mg total) by mouth daily. 11/06/18   [name], MD  predniSONE (DELTASONE) 10 MG tablet Take 1 tablet (10 mg total) by mouth daily. 07/25/19   Cuthriell, Charline Bills, PA-C  QUEtiapine (SEROQUEL) 400 MG tablet Take 400 mg by mouth at bedtime.    [provider]  traMADol (ULTRAM) 50 MG tablet Take 1 tablet (50 mg total) by mouth every 6 (six) hours as needed (pain). 04/17/19 04/16/20  Lysle Pearl, Isami, DO  traMADol (ULTRAM) 50 MG tablet Take 1 tablet (50 mg total) by mouth every 6 (six) hours as needed. 07/25/19   Cuthriell, Charline Bills, PA-C  traZODone (DESYREL) 100 MG tablet Take 100 mg by mouth at bedtime.    [provider]  triamcinolone cream (KENALOG) 0.5 % Apply 1 application topically 2 (two) times daily. Do not use on face of genital area 05/09/19   Fredderick Severance, NP  TRULICITY 1.5 YD/7.4JO SOPN Inject 1.5 mLs as directed every Sunday.  03/26/18   [provider]    Allergies Mirtazapine, Morphine and related, Sulfamethoxazole-trimethoprim, Amoxicillin er, Codeine, Hydrocodone-acetaminophen, Keflex [cephalexin], and Prasterone  Family History  Problem Relation Age of Onset  . Diabetes Mother   . Heart disease Mother   . Hyperlipidemia Mother   . Hypertension Mother   . Kidney disease Mother   . Mental illness Mother   . Hypothyroidism Mother   . Stroke Mother   . Osteoporosis Mother   . Glaucoma Mother   . Congestive Heart Failure Mother   . Hypertension Father   . Asthma Daughter   . Cancer Daughter        throat  . Bipolar disorder Daughter   . Heart disease Maternal Grandfather   . Rheum arthritis Maternal Grandfather   . Cancer Maternal Grandfather        liver  . Kidney disease Maternal Grandfather   . Cancer Paternal Grandmother        lung  . Kidney disease Brother   . Breast cancer Maternal Grandmother 60  . Bipolar disorder Daughter   . Hypertension Daughter   . Restless legs syndrome Daughter     Social  History Social History   Tobacco Use  . Smoking status: Never Smoker  . Smokeless tobacco:  Never Used  Substance Use Topics  . Alcohol use: No  . Drug use: No     Review of Systems  Constitutional: No fever/chills Eyes: No visual changes. No discharge ENT: No upper respiratory complaints. Cardiovascular: no chest pain. Respiratory: no cough. No SOB. Gastrointestinal: No abdominal pain.  No nausea, no vomiting.  No diarrhea.  No constipation. Musculoskeletal: Positive for right thumb pain Skin: Negative for rash, abrasions, lacerations, ecchymosis. Neurological: Negative for headaches, focal weakness or numbness. 10-point ROS otherwise negative.  ____________________________________________   PHYSICAL EXAM:  VITAL SIGNS: ED Triage Vitals [07/25/19 1839]  Enc Vitals Group     BP 125/63     Pulse Rate 86     Resp 16     Temp 98.8 F (37.1 C)     Temp Source Oral     SpO2 94 %     Weight 195 lb (88.5 kg)     Height 4' 11"  (1.499 m)     Head Circumference      Peak Flow      Pain Score 6     Pain Loc      Pain Edu?      Excl. in [place]?      Constitutional: Alert and oriented. Well appearing and in no acute distress. Eyes: Conjunctivae are normal. PERRL. EOMI. Head: Atraumatic. ENT:      Ears:       Nose: No congestion/rhinnorhea.      Mouth/Throat: Mucous membranes are moist.  Neck: No stridor.    Cardiovascular: Normal rate, regular rhythm. Normal S1 and S2.  Good peripheral circulation. Respiratory: Normal respiratory effort without tachypnea or retractions. Lungs CTAB. Good air entry to the bases with no decreased or absent breath sounds. Musculoskeletal: Full range of motion to all extremities. No gross deformities appreciated.  Visualization of the right hand and thumb reveals no edema, erythema.  Patient is able to extend and flex the thumb at this time.  Patient is very tender to palpation along the MCP joint of the thumb extending along the extensor tendon  of the thumb.  Positive for Finkelstein's.  No other appreciable findings about the right hand.  Sensation and capillary refill intact all digits.   Neurologic:  Normal speech and language. No gross focal neurologic deficits are appreciated.  Skin:  Skin is warm, dry and intact. No rash noted. Psychiatric: Mood and affect are normal. Speech and behavior are normal. Patient exhibits appropriate insight and judgement.   ____________________________________________   LABS (all labs ordered are listed, but only abnormal results are displayed)  Labs Reviewed - No data to display ____________________________________________  EKG   ____________________________________________  RADIOLOGY   No results found.  ____________________________________________    PROCEDURES  Procedure(s) performed:    .Splint Application  Date/Time: 07/25/2019 8:22 PM Performed by: Darletta Moll, PA-C Authorized by: Darletta Moll, PA-C   Consent:    Consent obtained:  Verbal   Consent given by:  Patient   Risks discussed:  Pain Pre-procedure details:    Sensation:  Normal Procedure details:    Laterality:  Right   Location:  Finger   Finger:  R thumb   Splint type:  Thumb spica   Supplies:  Prefabricated splint Post-procedure details:    Pain:  Improved   Sensation:  Normal   Patient tolerance of procedure:  Tolerated well, no immediate complications      Medications  dexamethasone (DECADRON) injection 10 mg (has no administration in time range)  ____________________________________________   INITIAL IMPRESSION / ASSESSMENT AND PLAN / ED COURSE  Pertinent labs & imaging results that were available during my care of the patient were reviewed by me and considered in my medical decision making (see chart for details).  Review of the [place] CSRS was performed in accordance of the [place] prior to dispensing any controlled drugs.           Patient's diagnosis is  consistent with de Quervain's tenosynovitis.  Patient presented to emergency department with sharp thumb pain.  Visualization of the area, physical exam are most consistent with de Quervain's tenosynovitis.  Patient was placed in removable thumb spica splint.  Patient does have a history of chronic kidney disease and will be placed on prednisone course.  Limited Ultram for pain.  Follow-up with orthopedics as needed..  Patient is given ED precautions to return to the ED for any worsening or new symptoms.     ____________________________________________  FINAL CLINICAL IMPRESSION(S) / ED DIAGNOSES  Final diagnoses:  Tenosynovitis, de Quervain      NEW MEDICATIONS STARTED DURING THIS VISIT:  ED Discharge Orders         Ordered    traMADol (ULTRAM) 50 MG tablet  Every 6 hours PRN     07/25/19 2020    predniSONE (DELTASONE) 10 MG tablet  Daily    Note to Pharmacy: Take 6 pills x 2 days, 5 pills x 2 days, 4 pills x 2 days, 3 pills x 2 days, 2 pills x 2 days, and 1 pill x 2 days   07/25/19 2020              This chart was dictated using voice recognition software/Dragon. Despite best efforts to proofread, errors can occur which can change the meaning. Any change was purely unintentional.    Darletta Moll, PA-C 07/25/19 2030    Duffy Bruce, MD 07/27/19 1550

## 2019-07-25 NOTE — ED Triage Notes (Signed)
Patient c/o right wrist/hand pain X3-4 days. Denies injury

## 2019-07-25 NOTE — ED Notes (Signed)
Pt agrees to wait for 73mns post IM shot before leaving. Understands to notify RN if any reaction occurs.

## 2019-07-25 NOTE — ED Notes (Signed)
Roderic Palau PA placed pt in spica.

## 2019-08-04 ENCOUNTER — Encounter: Payer: Self-pay | Admitting: Family Medicine

## 2019-08-12 ENCOUNTER — Encounter: Payer: Self-pay | Admitting: Family Medicine

## 2019-08-12 ENCOUNTER — Ambulatory Visit (INDEPENDENT_AMBULATORY_CARE_PROVIDER_SITE_OTHER): Payer: Medicare HMO | Admitting: Family Medicine

## 2019-08-12 ENCOUNTER — Other Ambulatory Visit: Payer: Self-pay

## 2019-08-12 DIAGNOSIS — G4733 Obstructive sleep apnea (adult) (pediatric): Secondary | ICD-10-CM

## 2019-08-12 DIAGNOSIS — F250 Schizoaffective disorder, bipolar type: Secondary | ICD-10-CM | POA: Diagnosis not present

## 2019-08-12 DIAGNOSIS — M654 Radial styloid tenosynovitis [de Quervain]: Secondary | ICD-10-CM | POA: Diagnosis not present

## 2019-08-12 DIAGNOSIS — K7581 Nonalcoholic steatohepatitis (NASH): Secondary | ICD-10-CM

## 2019-08-12 DIAGNOSIS — I10 Essential (primary) hypertension: Secondary | ICD-10-CM | POA: Diagnosis not present

## 2019-08-12 DIAGNOSIS — G2581 Restless legs syndrome: Secondary | ICD-10-CM | POA: Diagnosis not present

## 2019-08-12 DIAGNOSIS — J449 Chronic obstructive pulmonary disease, unspecified: Secondary | ICD-10-CM

## 2019-08-12 DIAGNOSIS — E1140 Type 2 diabetes mellitus with diabetic neuropathy, unspecified: Secondary | ICD-10-CM

## 2019-08-12 DIAGNOSIS — K219 Gastro-esophageal reflux disease without esophagitis: Secondary | ICD-10-CM

## 2019-08-12 DIAGNOSIS — E038 Other specified hypothyroidism: Secondary | ICD-10-CM

## 2019-08-12 DIAGNOSIS — E1165 Type 2 diabetes mellitus with hyperglycemia: Secondary | ICD-10-CM

## 2019-08-12 DIAGNOSIS — R202 Paresthesia of skin: Secondary | ICD-10-CM

## 2019-08-12 DIAGNOSIS — IMO0002 Reserved for concepts with insufficient information to code with codable children: Secondary | ICD-10-CM

## 2019-08-12 MED ORDER — GABAPENTIN 300 MG PO CAPS: ORAL_CAPSULE | ORAL | 1 refills | Status: DC

## 2019-08-12 MED ORDER — ALBUTEROL SULFATE HFA 108 (90 BASE) MCG/ACT IN AERS: 2.0000 | INHALATION_SPRAY | Freq: Four times a day (QID) | RESPIRATORY_TRACT | 3 refills | Status: DC | PRN

## 2019-08-12 NOTE — Progress Notes (Signed)
Name: [name]   MRN: [id number]    DOB: 07-13-1966   Date:08/12/2019       Progress Note  Subjective  Chief Complaint  Chief Complaint  Patient presents with  . Hypertension    follow up  . Hyperlipidemia  . Diabetes    BS running 300's    I connected with  [name] on 08/12/19 at  9:00 AM EST by telephone and verified that I am speaking with the correct person using two identifiers.  I discussed the limitations, risks, security and privacy concerns of performing an evaluation and management service by telephone and the availability of in person appointments. Staff also discussed with the patient that there may be a patient responsible charge related to this service. Patient Location: Home Provider Location: Office Additional Individuals present: None  HPI  HTN: She is unable to check BP's at home.  Denies chest pain, shortness of breath, headaches or blurred vision.  Has occasional BLE edema (dependent) - does not wear compression stockings.  Taking metoprolol and low dose lisinopril.   Restless leg: She is taking gabapentin 325m - 1 tab in the morning and 2 at night, still getting some pain and cramping - we will increase to 1 tab morning and afternoon and 2 at night.  Will consider requip in the future if needed.  Schizophrenia w/ Bipolar: Seeing Dr. KNicolasa Duckingfor care.  Taking lamictal, seroquel, buspar, and trazodone.  Mood and sleep have been okay, buspar dosing was changed and sleep is much better.  DAlfonse RasTenosynovitis: Was seen in the ER 07/25/2019 for this, given prednisone and tramadol.  Wearing a brace, has not seen ortho yet.  Diabetes: Seeing [name] compliant with humulin 70 TID, Novolog sliding scale, jardiance PO, trulicity weekly.  She notes that her BG's have been in the 300's - recent increase in insulin to help with this.  Her most recent appt was 2 weeks ago.  Working on her diet.  Denies polyuria, or polyuria.  Endorses polyphagia.  OSA +  COPD: She uses O2 at night - 2L, not on CPAP.  She is not seeing pulmonology, and declines referral.  Denies shortness of breath, nighttime waking, or cough.  NASH and GERD: Has NASH found by [name]  Referred to [name] patient notes that she has not discussed with [name]- she will call to schedule.  No heartburn recently, no blood in stool, difficulty swallowing, pale/yellow stool, no yellowing of the skin or eyes; does endorse some easy bruising.   Hypothyroid: Management by [name] taking 550m once daily.  Denies hair/skin/nail changes, no palpitations, no significant weight changes.  Will bring recent labs to our clinic for review.   Patient Active Problem List   Diagnosis Date Noted  . Abscess of axilla, right 04/16/2019  . Iron deficiency anemia due to chronic blood loss   . Polyp of ascending colon   . Chronic gastric ulcer with hemorrhage   . Type 2 diabetes with decreased circulation ([place]CF[place]03/01/2019  . Vitamin B12 deficiency 08/08/2018  . Encounter for Medicare annual wellness exam 10/30/2017  . Patient is full code 10/30/2017  . Disorder of bursae of shoulder region 10/09/2017  . Coronary artery disease 10/09/2017  . Plantar fascial fibromatosis 10/09/2017  . Tendinitis of wrist 10/09/2017  . Schizoaffective disorder, bipolar type (HCRio [place]01/25/2019  . Chest pain 09/10/2017  . Pleuritic chest pain 05/27/2017  . Hypertriglyceridemia 12/21/2016  . Pansinusitis 11/15/2016  .  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 Breast cancer screening 09/28/2016  . Urine frequency 09/28/2016  . Preventative health care 09/28/2016  . Fatigue 05/01/2016  . Urinary tract infection 03/14/2016  . Adenomatous colon polyp 10/15/2015  . Airway hyperreactivity 10/15/2015  . Bipolar affective disorder ([place]) 10/15/2015  . CN (constipation) 10/15/2015  . CAFL (chronic airflow limitation) ([place]) 10/15/2015  . Drug-induced hepatic toxicity 10/15/2015  . Blood in  feces 10/15/2015  . HLD (hyperlipidemia) 10/15/2015  . Hypoxemia 10/15/2015  . NASH (nonalcoholic steatohepatitis) 10/15/2015  . Morbid obesity ([place]) 10/15/2015  . Restless leg syndrome 10/15/2015  . Type 2 diabetes, uncontrolled, with neuropathy ([place]) 10/15/2015  . Elevated alkaline phosphatase level 09/22/2015  . Urinary bladder incontinence 09/22/2015  . Low back pain with left-sided sciatica 08/18/2015  . Encounter for monitoring tricyclic antidepressant therapy 05/13/2015  . Bipolar disorder ([place]) 05/11/2015  . Liver disease 05/11/2015  . Apnea, sleep 05/05/2015  . Heart valve disease 05/05/2015  . Chronic kidney disease, stage III (moderate) 03/15/2015  . Migraine 03/12/2015  . COPD (chronic obstructive pulmonary disease) ([place]) 01/20/2015  . 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 GERD (gastroesophageal reflux disease) 01/20/2015  . Hypothyroid   . Vitamin D deficiency   . Urinary incontinence   . Liver lesion   . Hypomagnesemia   . Obstructive sleep apnea 10/20/2013  . Vaginitis 12/19/2011  . PERIPHERAL NEUROPATHY 03/04/2009  . HEMORRHOIDS, EXTERNAL 09/03/2008  . ALLERGIC RHINITIS 07/04/2007  . ASTHMA, PERSISTENT, MODERATE 07/04/2007  . INTERSTITIAL CYSTITIS 07/04/2007  . HYPERTENSION, BENIGN ESSENTIAL 06/19/2007    Past Surgical History:  Procedure Laterality Date  . ABDOMINAL HYSTERECTOMY    . ABDOMINAL HYSTERECTOMY     Partial  . bladder stim  2007  . BLADDER SURGERY     x 2. tacking of bladder and the other was the stimulator placement  . CHOLECYSTECTOMY    . COLONOSCOPY    . COLONOSCOPY WITH PROPOFOL N/A 03/04/2019   Procedure: COLONOSCOPY WITH PROPOFOL;  Surgeon: [name], MD;  Location: [facility] ENDOSCOPY;  Service: Endoscopy;  Laterality: N/A;  . ESOPHAGOGASTRODUODENOSCOPY (EGD) WITH PROPOFOL N/A 03/04/2019   Procedure: ESOPHAGOGASTRODUODENOSCOPY (EGD) WITH PROPOFOL;  Surgeon: [name], MD;  Location: [facility] ENDOSCOPY;  Service: Endoscopy;   Laterality: N/A;  . INCISION AND DRAINAGE ABSCESS Right 04/16/2019   Procedure: INCISION AND DRAINAGE ABSCESS;  Surgeon: Benjamine Sprague, DO;  Location: [facility] ORS;  Service: General;  Laterality: Right;  . INTERSTIM-GENERATOR CHANGE N/A 12/31/2017   Procedure: INTERSTIM-GENERATOR CHANGE;  Surgeon: Bjorn Loser, MD;  Location: [facility] ORS;  Service: Urology;  Laterality: N/A;  . LIVER BIOPSY  March 2016   Negative  . LUNG BIOPSY  2015   small spot on lung  . TUBAL LIGATION    . UPPER GI ENDOSCOPY      Family History  Problem Relation Age of Onset  . Diabetes Mother   . Heart disease Mother   . Hyperlipidemia Mother   . Hypertension Mother   . Kidney disease Mother   . Mental illness Mother   . Hypothyroidism Mother   . Stroke Mother   . Osteoporosis Mother   . Glaucoma Mother   . Congestive Heart Failure Mother   . Hypertension Father   . Asthma Daughter   . Cancer Daughter        throat  . Bipolar disorder Daughter   . Heart disease Maternal Grandfather   . Rheum arthritis Maternal Grandfather   . Cancer  Maternal Grandfather        liver  . Kidney disease Maternal Grandfather   . Cancer Paternal Grandmother        lung  . Kidney disease Brother   . Breast cancer Maternal Grandmother 60  . Bipolar disorder Daughter   . Hypertension Daughter   . Restless legs syndrome Daughter     Social History   Socioeconomic History  . Marital status: Married    Spouse name: Not on file  . Number of children: 2  . Years of education: 83  . Highest education level: Not on file  Occupational History  . Occupation: disabled  Social Needs  . Financial resource strain: Not hard at all  . Food insecurity    Worry: Never true    Inability: Never true  . Transportation needs    Medical: No    Non-medical: No  Tobacco Use  . Smoking status: Never Smoker  . Smokeless tobacco: Never Used  Substance and Sexual Activity  . Alcohol use: No  . Drug use: No  . Sexual activity: Yes     Partners: Male    Birth control/protection: Surgical  Lifestyle  . Physical activity    Days per week: 0 days    Minutes per session: 0 min  . Stress: Very much  Relationships  . Social connections    Talks on phone: More than three times a week    Gets together: More than three times a week    Attends religious service: Never    Active member of club or organization: No    Attends meetings of clubs or organizations: Never    Relationship status: Married  . Intimate partner violence    Fear of current or ex partner: No    Emotionally abused: No    Physically abused: No    Forced sexual activity: No  Other Topics Concern  . Not on file  Social History Narrative   ** Merged History Encounter **       16-20 oz of caffeine a day      Current Outpatient Medications:  .  amoxicillin (AMOXIL) 500 MG capsule, Take 500 mg by mouth daily., Disp: , Rfl:  .  atorvastatin (LIPITOR) 80 MG tablet, Take 80 mg by mouth at bedtime. , Disp: , Rfl:  .  busPIRone (BUSPAR) 10 MG tablet, Take 10 mg by mouth 2 (two) times daily., Disp: , Rfl:  .  DULoxetine (CYMBALTA) 60 MG capsule, Take 120 mg by mouth daily., Disp: , Rfl:  .  empagliflozin (JARDIANCE) 25 MG TABS tablet, Take 25 mg by mouth daily., Disp: , Rfl:  .  ezetimibe (ZETIA) 10 MG tablet, Take 10 mg by mouth daily. , Disp: , Rfl:  .  fenofibrate micronized (LOFIBRA) 134 MG capsule, Take 134 mg by mouth daily., Disp: , Rfl:  .  gabapentin (NEURONTIN) 300 MG capsule, Take 1 tablet each morning and 2 tablets at bedtime., Disp: 270 capsule, Rfl: 1 .  HUMULIN R U-500 KWIKPEN 500 UNIT/ML kwikpen, Inject 70 Units as directed 3 (three) times daily. , Disp: , Rfl:  .  insulin aspart (NOVOLOG) 100 UNIT/ML injection, Inject 5-25 Units into the skin 3 (three) times daily before meals. , Disp: , Rfl:  .  lamoTRIgine (LAMICTAL) 200 MG tablet, Take 200 mg by mouth 2 (two) times daily. , Disp: , Rfl:  .  levothyroxine (SYNTHROID) 50 MCG tablet, 1 TAB  daily three days a week,  1.5 TABS four  days a week (Mon, Wed, Fri, Sat for example), Disp: 108 tablet, Rfl: 1 .  lisinopril (ZESTRIL) 2.5 MG tablet, TAKE 1 TABLET BY MOUTH EVERY DAY, Disp: 90 tablet, Rfl: 1 .  metoprolol tartrate (LOPRESSOR) 25 MG tablet, Take one tablet 25 mg in the am and 1/2 tab in the pm, Disp: 135 tablet, Rfl: 0 .  Omega-3 Fatty Acids (FISH OIL PO), Take by mouth daily., Disp: , Rfl:  .  ondansetron (ZOFRAN) 4 MG tablet, Take 1 tablet (4 mg total) by mouth every 8 (eight) hours as needed for nausea or vomiting., Disp: 20 tablet, Rfl: 2 .  pantoprazole (PROTONIX) 40 MG tablet, Take 1 tablet (40 mg total) by mouth daily., Disp: 90 tablet, Rfl: 3 .  QUEtiapine (SEROQUEL) 400 MG tablet, Take 400 mg by mouth at bedtime., Disp: , Rfl:  .  traZODone (DESYREL) 100 MG tablet, Take 100 mg by mouth at bedtime., Disp: , Rfl:  .  triamcinolone cream (KENALOG) 0.5 %, Apply 1 application topically 2 (two) times daily. Do not use on face of genital area, Disp: 60 g, Rfl: 0 .  TRULICITY 1.5 VP/7.1GG SOPN, Inject 1.5 mLs as directed every Sunday. , Disp: , Rfl:  .  predniSONE (DELTASONE) 10 MG tablet, Take 1 tablet (10 mg total) by mouth daily. (Patient not taking: Reported on 08/12/2019), Disp: 42 tablet, Rfl: 0 .  traMADol (ULTRAM) 50 MG tablet, Take 1 tablet (50 mg total) by mouth every 6 (six) hours as needed (pain). (Patient not taking: Reported on 08/12/2019), Disp: 20 tablet, Rfl: 0 .  traMADol (ULTRAM) 50 MG tablet, Take 1 tablet (50 mg total) by mouth every 6 (six) hours as needed. (Patient not taking: Reported on 08/12/2019), Disp: 10 tablet, Rfl: 0  Allergies  Allergen Reactions  . Mirtazapine Anaphylaxis    REACTION: closes throat  . Morphine And Related     anaphylaxis  . Sulfamethoxazole-Trimethoprim Rash    REACTION: Whelps all over  . Amoxicillin Er Nausea And Vomiting    Has patient had a PCN reaction causing immediate rash, facial/tongue/throat swelling, SOB or  lightheadedness with hypotension: No Has patient had a PCN reaction causing severe rash involving mucus membranes or skin necrosis: No Has patient had a PCN reaction that required hospitalization: No Has patient had a PCN reaction occurring within the last 10 years: Yes If all of the above answers are "NO", then may proceed with Cephalosporin use.  . Codeine Itching    REACTION: itch, rash  . Hydrocodone-Acetaminophen Rash    REACTION: rash  . Keflex [Cephalexin] Rash  . Prasterone Nausea And Vomiting    I personally reviewed active problem list, medication list, allergies, notes from last encounter, lab results with the patient/caregiver today.   ROS  Ten systems reviewed and is negative except as mentioned in HPI  Objective  Virtual encounter, vitals not obtained.  There is no height or weight on file to calculate BMI.  Physical Exam   Pulmonary/Chest: Effort normal. No respiratory distress. Speaking in complete sentences Neurological: Pt is alert and oriented to person, place, and time. Speech is normal Psychiatric: Patient has a normal mood and affect. behavior is normal. Judgment and thought content normal.  No results found for this or any previous visit (from the past 72 hour(s)).  PHQ2/9: Depression screen [facility] 2/9 08/12/2019 04/09/2019 02/20/2019 11/22/2018 11/07/2018  Decreased Interest 0 0 0 0 2  Down, Depressed, Hopeless 0 0 0 0 0  PHQ - 2 Score 0 0  0 0 2  Altered sleeping 0 0 0 0 2  Tired, decreased energy 1 0 0 0 2  Change in appetite 1 0 0 - 1  Feeling bad or failure about yourself  0 0 0 0 0  Trouble concentrating 0 0 0 0 1  Moving slowly or fidgety/restless 0 0 0 0 3  Suicidal thoughts 0 0 0 0 0  PHQ-9 Score 2 0 0 0 11  Difficult doing work/chores Somewhat difficult Not difficult at all Not difficult at all Not difficult at all Somewhat difficult  Some recent data might be hidden   PHQ-2/9 Result is negative.    Fall Risk: Fall Risk  08/12/2019 04/09/2019  02/20/2019 12/24/2018 11/22/2018  Falls in the past year? 0 0 1 1 1   Comment - - - - -  Number falls in past yr: 0 0 1 - 1  Comment - - - - -  Injury with Fall? 0 0 0 - 0  Comment - - - - -  Risk for fall due to : - - - - -  Follow up Falls evaluation completed - - - Falls evaluation completed    Assessment & Plan  1. HYPERTENSION, BENIGN ESSENTIAL - Stable per her report; continue current medicaitons  2. Schizoaffective disorder, bipolar type ([place]) - Doing well with psychiatry and current medication regimen  3. Restless leg syndrome - Increase gabapentin; consider requip in future if gabapentin ineffecrtive - gabapentin (NEURONTIN) 300 MG capsule; Take 1 tablet each morning, 1 tablet each afternoon, and 2 tablets at bedtime.  Dispense: 360 capsule; Refill: 1  4. Tenosynovitis, de [place]erv[place] - Ambulatory referral to Hand Surgery  5. Paresthesia of both hands - gabapentin (NEURONTIN) 300 MG capsule; Take 1 tablet each morning, 1 tablet each afternoon, and 2 tablets at bedtime.  Dispense: 360 capsule; Refill: 1 - Ambulatory referral to Hand Surgery  6. Type 2 diabetes, uncontrolled, with neuropathy ([place]) - Managed by endocrinology; discussed dietary changes in detail - gabapentin (NEURONTIN) 300 MG capsule; Take 1 tablet each morning, 1 tablet each afternoon, and 2 tablets at bedtime.  Dispense: 360 capsule; Refill: 1  7. Obstructive sleep apnea - albuterol (VENTOLIN HFA) 108 (90 Base) MCG/ACT inhaler; Inhale 2 puffs into the lungs every 6 (six) hours as needed for wheezing or shortness of breath.  Dispense: 18 g; Refill: 3  8. Chronic obstructive pulmonary disease, unspecified COPD type (HCC) - albuterol (VENTOLIN HFA) 108 (90 Base) MCG/ACT inhaler; Inhale 2 puffs into the lungs every 6 (six) hours as needed for wheezing or shortness of breath.  Dispense: 18 g; Refill: 3  9. NASH (nonalcoholic steatohepatitis) 10. Gastroesophageal reflux disease, unspecified whether esophagitis  present - She needs to schedule follow up with Dr. Allen Yang to discuss NASH - does not appear to have been addressed after referral in August 2020.  She will do this herself, if any issues will call back.  Avoiding tylenole  11. Other specified hypothyroidism - Managed by endocrinology and doing well   I discussed the assessment and treatment plan with the patient. The patient was provided an opportunity to ask questions and all were answered. The patient agreed with the plan and demonstrated an understanding of the instructions.   The patient was advised to call back or seek an in-person evaluation if the symptoms worsen or if the condition fails to improve as anticipated.  I provided 26 minutes of non-face-to-face time during this encounter.  Hubbard Hartshorn, FNP

## 2019-08-19 ENCOUNTER — Other Ambulatory Visit: Payer: Self-pay | Admitting: Family Medicine

## 2019-08-19 DIAGNOSIS — I10 Essential (primary) hypertension: Secondary | ICD-10-CM

## 2019-08-20 ENCOUNTER — Ambulatory Visit (INDEPENDENT_AMBULATORY_CARE_PROVIDER_SITE_OTHER): Payer: Medicare HMO | Admitting: Gastroenterology

## 2019-08-20 ENCOUNTER — Encounter: Payer: Self-pay | Admitting: Gastroenterology

## 2019-08-20 ENCOUNTER — Other Ambulatory Visit: Payer: Self-pay

## 2019-08-20 VITALS — BP 86/55 | HR 87 | Temp 97.9°F | Ht 59.0 in | Wt 199.4 lb

## 2019-08-20 DIAGNOSIS — R1013 Epigastric pain: Secondary | ICD-10-CM | POA: Diagnosis not present

## 2019-08-20 MED ORDER — PANTOPRAZOLE SODIUM 40 MG PO TBEC: 40.0000 mg | DELAYED_RELEASE_TABLET | Freq: Every day | ORAL | 3 refills | Status: DC

## 2019-08-20 NOTE — Progress Notes (Addendum)
Primary Care Physician: Hubbard Hartshorn, FNP  Primary Gastroenterologist:  Dr. [name]  Chief Complaint  Patient presents with  . Elevated Hepatic Enzymes    HPI: [name] is a 53 y.o. female here dyspepsia with continued burping.  The patient states that she continues to burp nonstop without any relief.  She also reports that she has some abdominal discomfort.  The patient has been found in the past to have peptic ulcer disease and was treated for that after having an upper endoscopy.  The patient also had a history of abnormal liver enzymes but her repeat liver enzymes in August showed the liver enzymes have come back to normal.  There is no report of any nausea vomiting fevers or chills.  The patient had the upper endoscopy and colonoscopy in the past because of iron deficiency anemia.  Current Outpatient Medications  Medication Sig Dispense Refill  . albuterol (VENTOLIN HFA) 108 (90 Base) MCG/ACT inhaler Inhale 2 puffs into the lungs every 6 (six) hours as needed for wheezing or shortness of breath. 18 g 3  . amoxicillin (AMOXIL) 500 MG tablet Take 500 mg by mouth daily.    Marland Kitchen atorvastatin (LIPITOR) 80 MG tablet Take 80 mg by mouth at bedtime.     . busPIRone (BUSPAR) 10 MG tablet Take 10 mg by mouth 2 (two) times daily.    . DULoxetine (CYMBALTA) 60 MG capsule Take 120 mg by mouth daily.    . empagliflozin (JARDIANCE) 25 MG TABS tablet Take 25 mg by mouth daily.    Marland Kitchen ezetimibe (ZETIA) 10 MG tablet Take 10 mg by mouth daily.     . fenofibrate micronized (LOFIBRA) 134 MG capsule Take 134 mg by mouth daily.    Marland Kitchen gabapentin (NEURONTIN) 300 MG capsule Take 1 tablet each morning, 1 tablet each afternoon, and 2 tablets at bedtime. 360 capsule 1  . HUMULIN R U-500 KWIKPEN 500 UNIT/ML kwikpen Inject 70 Units as directed 3 (three) times daily.     . insulin aspart (NOVOLOG) 100 UNIT/ML injection Inject 5-25 Units into the skin 3 (three) times daily before meals.     . lamoTRIgine  (LAMICTAL) 200 MG tablet Take 200 mg by mouth 2 (two) times daily.     Marland Kitchen levothyroxine (SYNTHROID) 50 MCG tablet 1 TAB daily three days a week,  1.5 TABS four days a week (Mon, Wed, Fri, Sat for example) 108 tablet 1  . lisinopril (ZESTRIL) 2.5 MG tablet TAKE 1 TABLET BY MOUTH EVERY DAY 90 tablet 1  . metoprolol tartrate (LOPRESSOR) 25 MG tablet TAKE 1 TABLET (25MG TOTAL) IN THE MORNING AND 1/2 TABLET IN THE EVENING 135 tablet 1  . Omega-3 Fatty Acids (FISH OIL PO) Take by mouth daily.    . ondansetron (ZOFRAN) 4 MG tablet Take 1 tablet (4 mg total) by mouth every 8 (eight) hours as needed for nausea or vomiting. 20 tablet 2  . pantoprazole (PROTONIX) 40 MG tablet Take 1 tablet (40 mg total) by mouth daily. 90 tablet 3  . QUEtiapine (SEROQUEL) 400 MG tablet Take 400 mg by mouth at bedtime.    . traZODone (DESYREL) 100 MG tablet Take 100 mg by mouth at bedtime.    . triamcinolone cream (KENALOG) 0.5 % Apply 1 application topically 2 (two) times daily. Do not use on face of genital area 60 g 0  . TRULICITY 1.5 KG/8.1EH SOPN Inject 1.5 mLs as directed every Sunday.      No current  facility-administered medications for this visit.    Allergies as of 08/20/2019 - Review Complete 08/20/2019  Allergen Reaction Noted  . Mirtazapine Anaphylaxis   . Morphine and related  10/20/2013  . Sulfamethoxazole-trimethoprim Rash 06/09/2008  . Amoxicillin er Nausea And Vomiting 12/24/2017  . Codeine Itching   . Hydrocodone-acetaminophen Rash   . Keflex [cephalexin] Rash 07/04/2018  . Prasterone Nausea And Vomiting 12/24/2017    ROS:  General: Negative for anorexia, weight loss, fever, chills, fatigue, weakness. ENT: Negative for hoarseness, difficulty swallowing , nasal congestion. CV: Negative for chest pain, angina, palpitations, dyspnea on exertion, peripheral edema.  Respiratory: Negative for dyspnea at rest, dyspnea on exertion, cough, sputum, wheezing.  GI: See history of present illness. GU:   Negative for dysuria, hematuria, urinary incontinence, urinary frequency, nocturnal urination.  Endo: Negative for unusual weight change.    Physical Examination:   BP (!) 86/55   Pulse 87   Temp 97.9 F (36.6 C) (Temporal)   Ht 4' 11"  (1.499 m)   Wt 199 lb 6.4 oz (90.4 kg)   BMI 40.27 kg/m   General: Well-nourished, well-developed in no acute distress.  Eyes: No icterus. Conjunctivae pink. Extremities: No extremity edema. No clubbing or deformities. Neuro: Alert and oriented x 3.  Grossly intact. Skin: Warm and dry, no jaundice.   Psych: Alert and cooperative, normal mood and affect.  Labs:    Imaging Studies: No results found.  Assessment and Plan:   [name] is a 53 y.o. y/o female who comes in with dyspepsia manifested by continuous burping.  The patient has been told that this is likely due to to aerophagia.  The patient has been told that it can also be exacerbated by reflux.  The patient has been told that eating fast and drinking carbonated drinks in addition to chewing gum and slipping at high drinks can all increase the amount of air swallow.  The patient has been told to be mindful of this and has also been told that she will be started on Protonix 40 mg a day to see if that helps her symptoms.  The patient has also been given samples of Trulance to help with her constipation since she reports that she is still having hard stools besides taking laxatives every day.  The patient has been informed that if air is coming up from her mouth that needs to go down prior to that.  Therefore she will try and be cognizant of her swallowing air and try to avoid it.  The patient has been explained the plan and agrees with it.     [name], MD. Marval Regal    Note: This dictation was prepared with Dragon dictation along with smaller phrase technology. Any transcriptional errors that result from this process are unintentional.

## 2019-08-21 ENCOUNTER — Ambulatory Visit: Payer: Self-pay

## 2019-08-21 ENCOUNTER — Ambulatory Visit: Payer: Self-pay | Admitting: *Deleted

## 2019-08-21 NOTE — Telephone Encounter (Signed)
LVM to inform patient that Dr. Ancil Boozer agreed with the advise that the triage nurse gave her and that if [name] or any of our other providers had an availability sooner we would call her but if her symptoms get worse to go to the ED.

## 2019-08-21 NOTE — Telephone Encounter (Signed)
Call dropped before triage nurse could talk to pt. Called back and left pt. A message to call back.

## 2019-08-21 NOTE — Telephone Encounter (Signed)
Calls with dizziness that began yesterday and continues today. Feels woozy headed/lightheaded constantly except with lying down. Denies headache/CP/SOB. Complains of hand twitching at times for about one month. B/P yesterday low at GI visit and today 105/69, HR 86.  Stated she takes lisinopril 2.5 mg tab daily. Instructed to stop lisinopril until her visit. Increase fluid intake daily and lie down with legs and feet elevated above her waist to help improve blood flow. Urgent symptoms like passing out/CP, call back or seek treatment at the ED. Virtual appointment scheduled for 08/27/19 with Raelyn Ensign, NP.   Reason for Disposition . [1] MILD dizziness (e.g., walking normally) AND [2] has NOT been evaluated by physician for this  (Exception: dizziness caused by heat exposure, sudden standing, or poor fluid intake)  Answer Assessment - Initial Assessment Questions 1. DESCRIPTION: "Describe your dizziness."     Bumping into the walls 2. LIGHTHEADED: "Do you feel lightheaded?" (e.g., somewhat faint, woozy, weak upon standing)     Feels woozy and wobbly all the time except when lying down 3. VERTIGO: "Do you feel like either you or the room is spinning or tilting?" (i.e. 4. SEVERITY: "How bad is it?"  "Do you feel like you are going to faint?" "Can you stand and walk?"   - MILD - walking normally   - MODERATE - interferes with normal activities (e.g., work, school)    - SEVERE - unable to stand, requires support to walk, feels like passing out now.      moderate 5. ONSET:  "When did the dizziness begin?"     yesterday 6. AGGRAVATING FACTORS: "Does anything make it worse?" (e.g., standing, change in head position)     No, feels this way all the time 7. HEART RATE: "Can you tell me your heart rate?" "How many beats in 15 seconds?"  (Note: not all patients can do this)       86 bpm 8. CAUSE: "What do you think is causing the dizziness?"     Low blood pressure 9. RECURRENT SYMPTOM: "Have you had  dizziness before?" If so, ask: "When was the last time?" "What happened that time?"      no 10. OTHER SYMPTOMS: "Do you have any other symptoms?" (e.g., fever, chest pain, vomiting, diarrhea, bleeding)       no 11. PREGNANCY: "Is there any chance you are pregnant?" "When was your last menstrual period?"       no  Protocols used: DIZZINESS [facility]

## 2019-08-27 ENCOUNTER — Telehealth: Payer: Medicare HMO | Admitting: Family Medicine

## 2019-08-27 ENCOUNTER — Other Ambulatory Visit: Payer: Self-pay

## 2019-09-03 ENCOUNTER — Other Ambulatory Visit: Payer: Self-pay | Admitting: Emergency Medicine

## 2019-09-03 DIAGNOSIS — E038 Other specified hypothyroidism: Secondary | ICD-10-CM

## 2019-09-04 ENCOUNTER — Other Ambulatory Visit: Payer: Self-pay | Admitting: Gastroenterology

## 2019-09-04 DIAGNOSIS — R110 Nausea: Secondary | ICD-10-CM

## 2019-09-04 MED ORDER — LEVOTHYROXINE SODIUM 50 MCG PO TABS: ORAL_TABLET | ORAL | 0 refills | Status: DC

## 2019-09-11 IMAGING — CR DG CHEST 2V
2 series · 2 of 2 positions shown · non-contrast
Comparison: 10/09/2017

CLINICAL DATA: Worsening dizziness and generalized weakness for 2
weeks, BILATERAL foot numbness, syncopal episode this morning,
history asthma, CHF, COPD, coronary artery disease, hypertension,
pneumonia

EXAM:
CHEST - 2 VIEW

[chest pa]
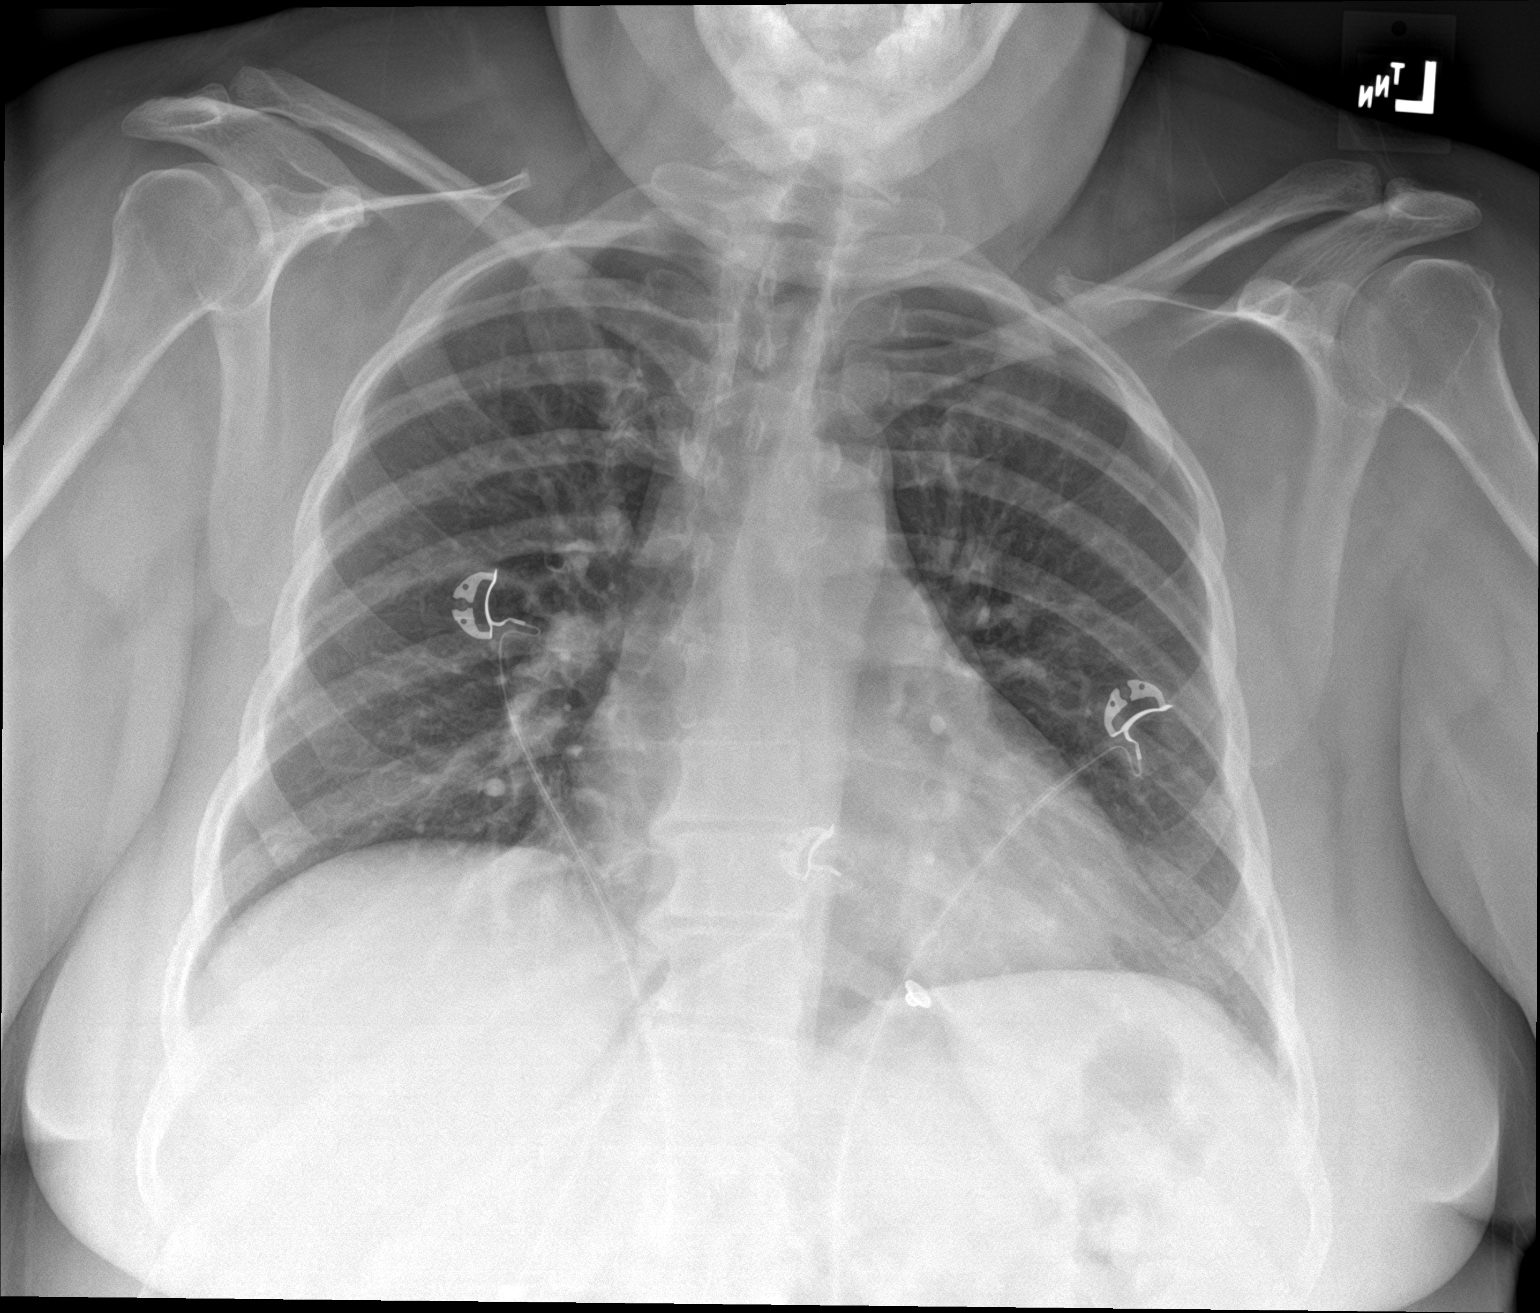

[chest lat]
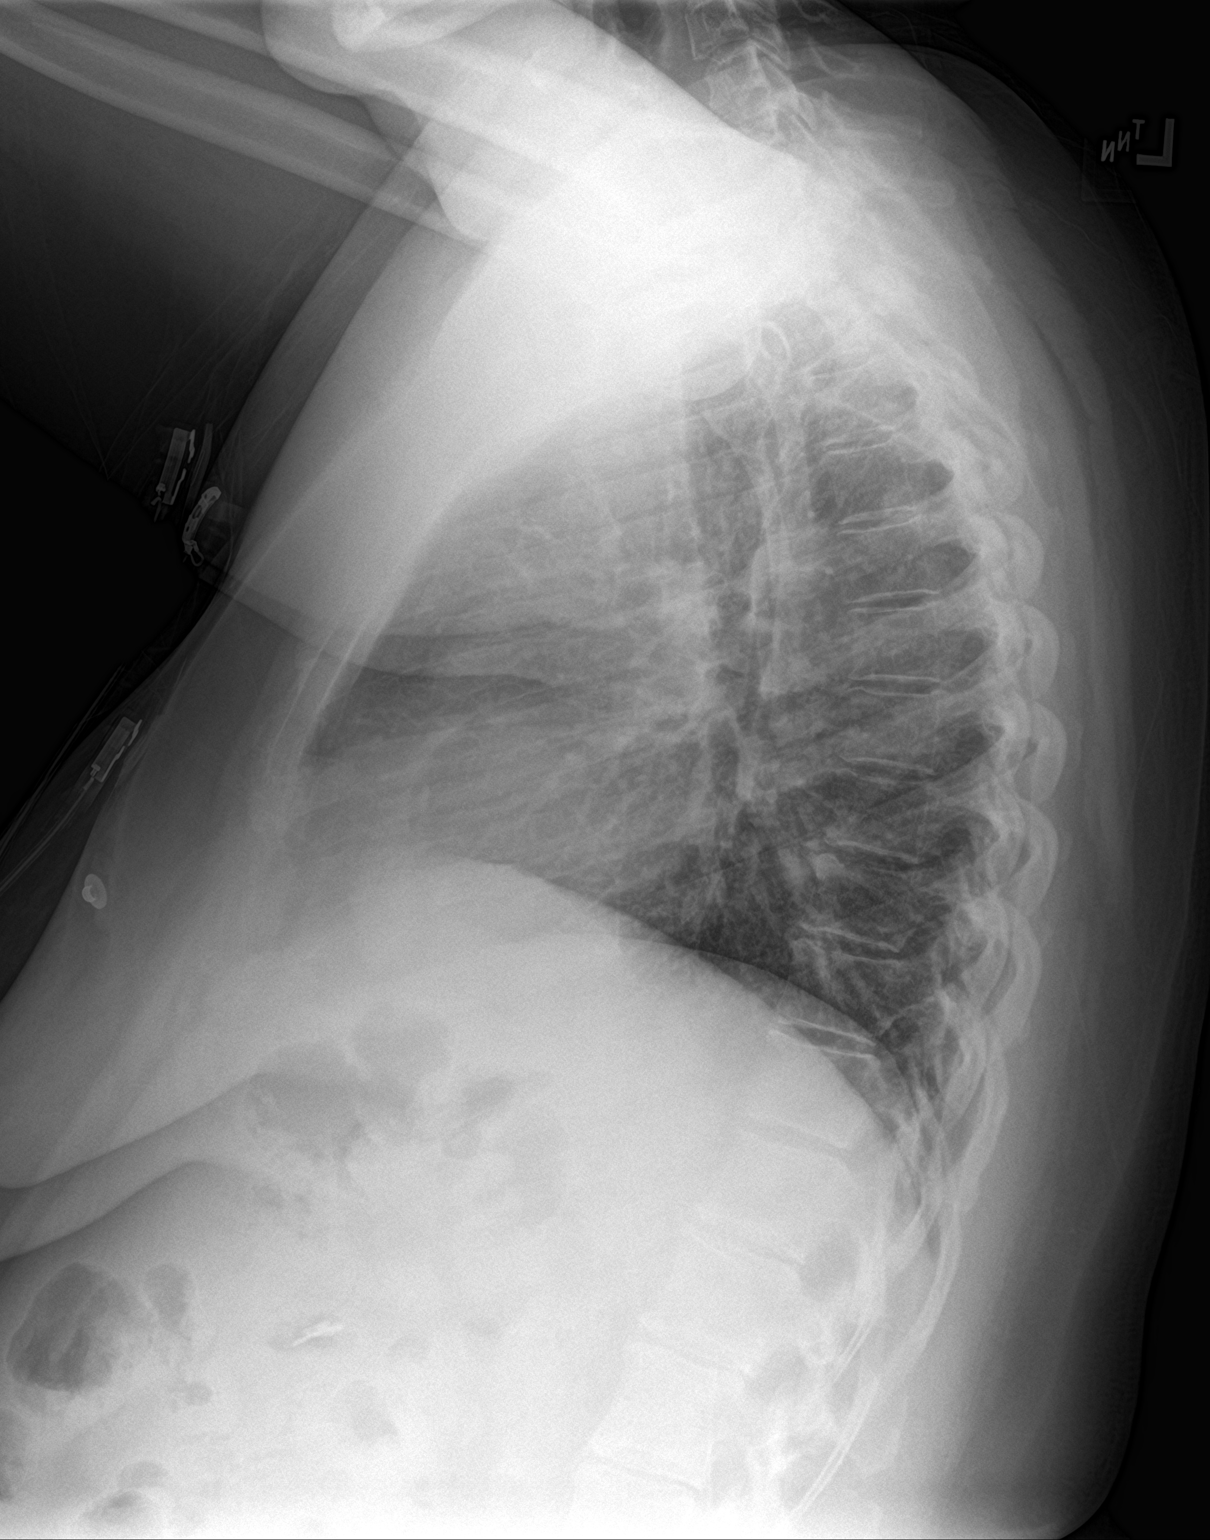

[2 of 2 positions shown; findings below may reference images not displayed]

FINDINGS: Upper normal heart size.

Mediastinal contours and pulmonary vascularity normal.

Minimal chronic peribronchial thickening.

Lungs clear.

No pleural effusion or pneumothorax.

Bones unremarkable.
IMPRESSION: Minimal chronic bronchitic changes without acute abnormality

## 2019-09-11 IMAGING — CT CT HEAD W/O CM
3 series · 15 of 45 positions shown, 18 images · non-contrast
Comparison: January 16, 2017

CLINICAL DATA: Syncope with dizziness and diplopia

EXAM:
CT HEAD WITHOUT CONTRAST
TECHNIQUE: Contiguous axial images were obtained from the base of the skull
through the vertex without intravenous contrast.

[Series 2: head wo · axial · 0.40mm/px · z∈[+441,+556]mm · 9 of 28 slices shown, 12 images]
[im 3/28  brain]
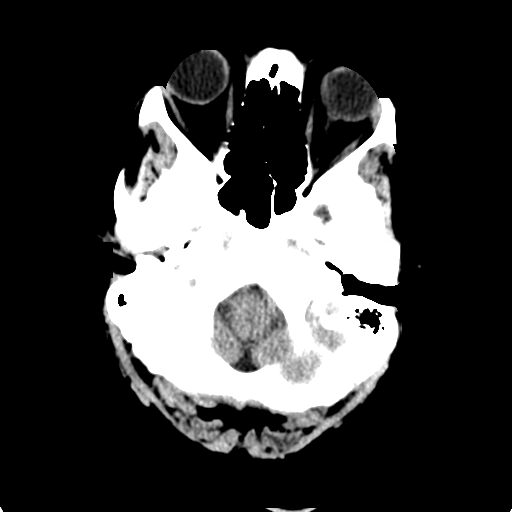
[im 3/28  bone]
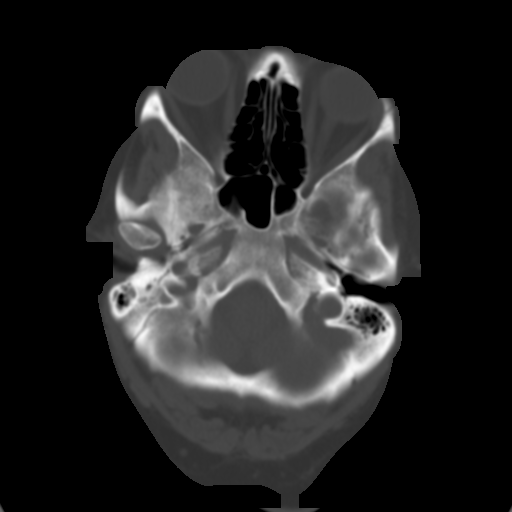
[im 6/28  brain]
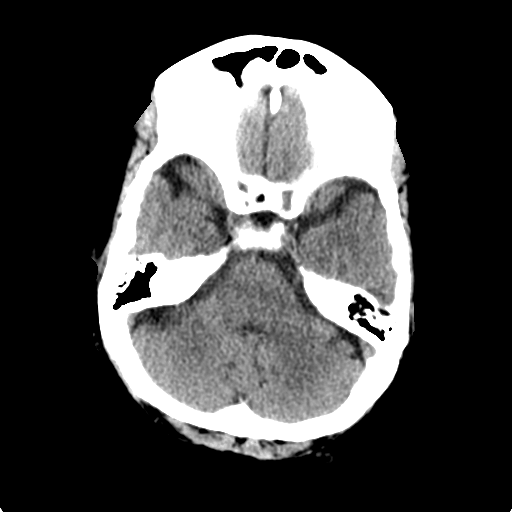
[im 9/28  brain]
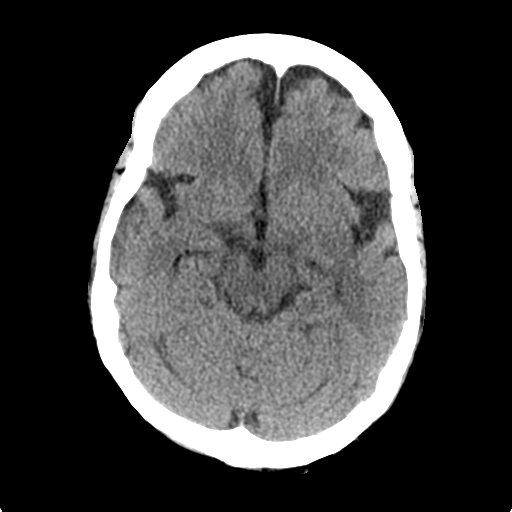
[im 12/28  brain]
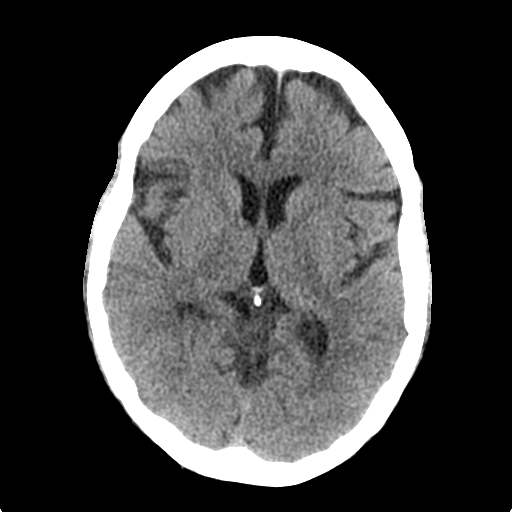
[im 15/28  brain]
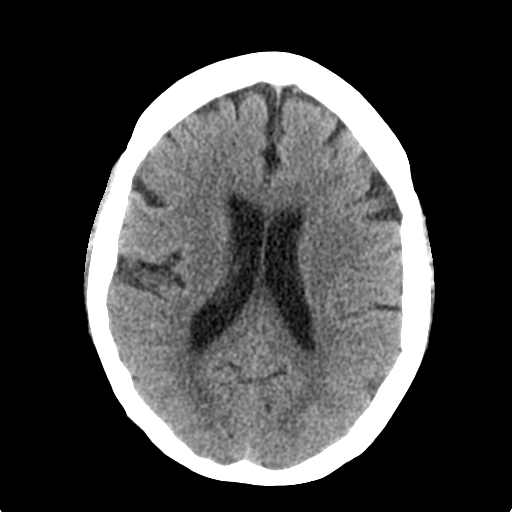
[im 15/28  bone]
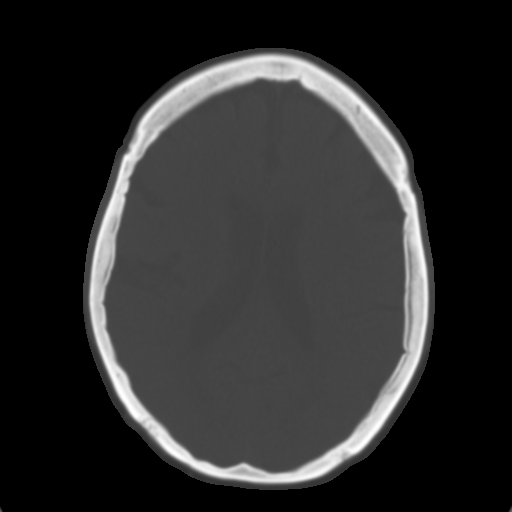
[im 17/28  brain]
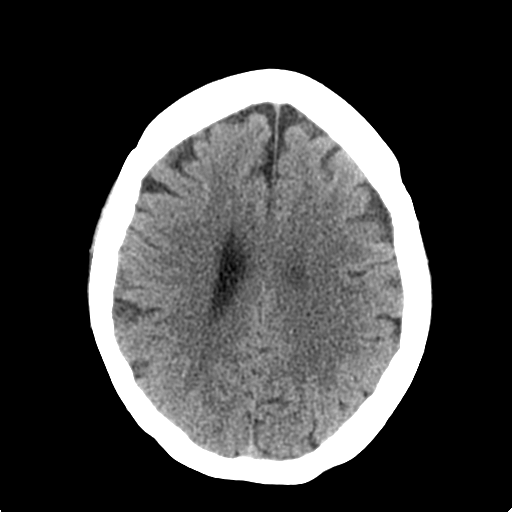
[im 20/28  brain]
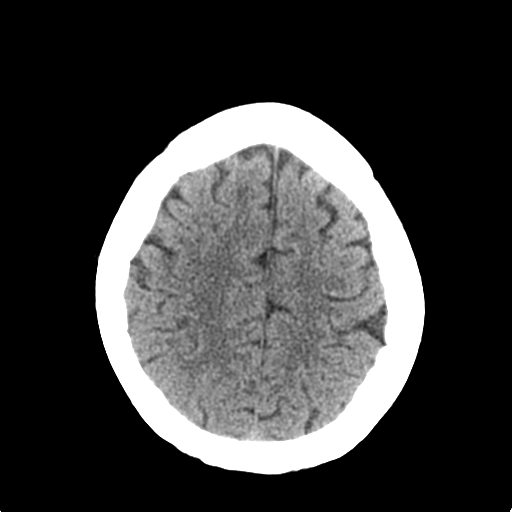
[im 23/28  brain]
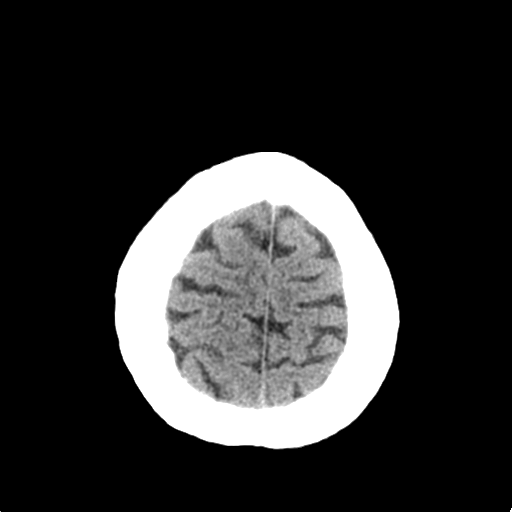
[im 26/28  brain]
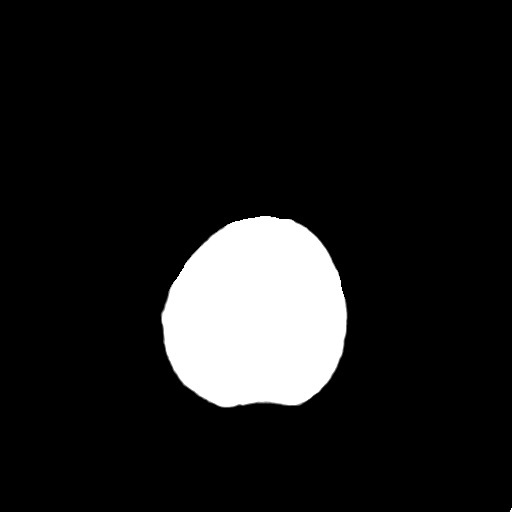
[im 26/28  bone]
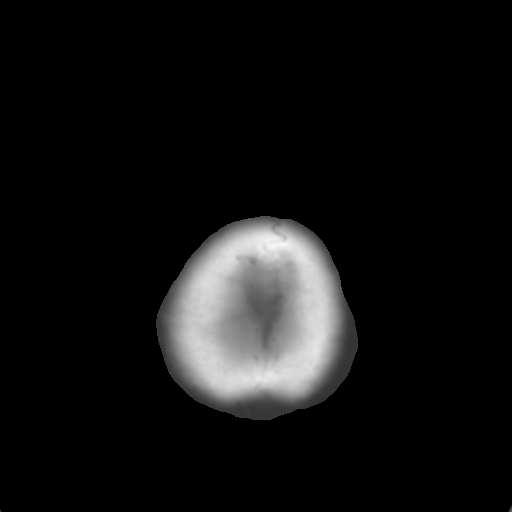

[Series 4: coronal soft tissue · coronal · 0.29mm/px · 3 of 63 slices shown]
[im 21/63  brain]
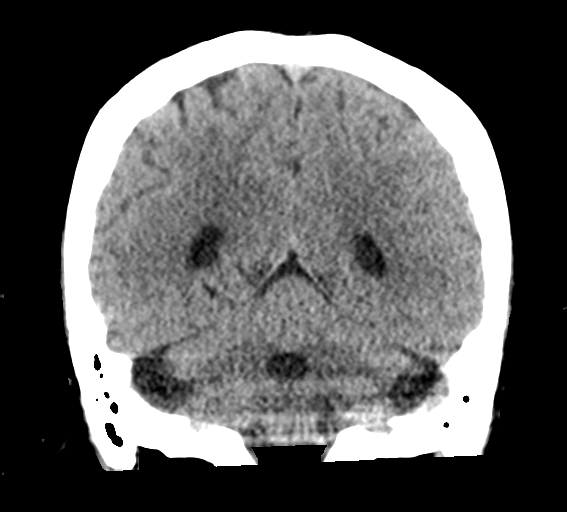
[im 28/63  brain]
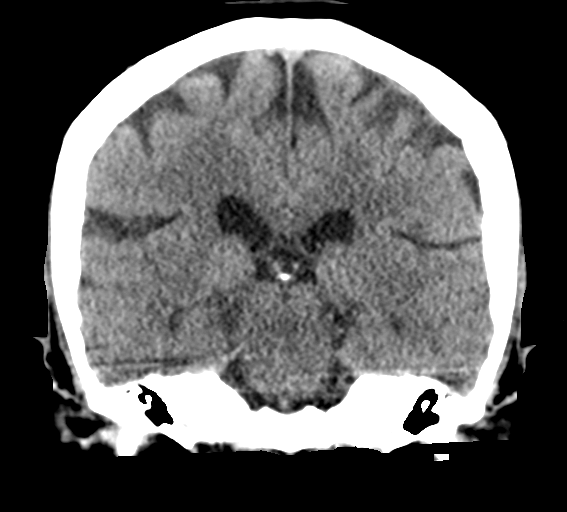
[im 35/63  brain]
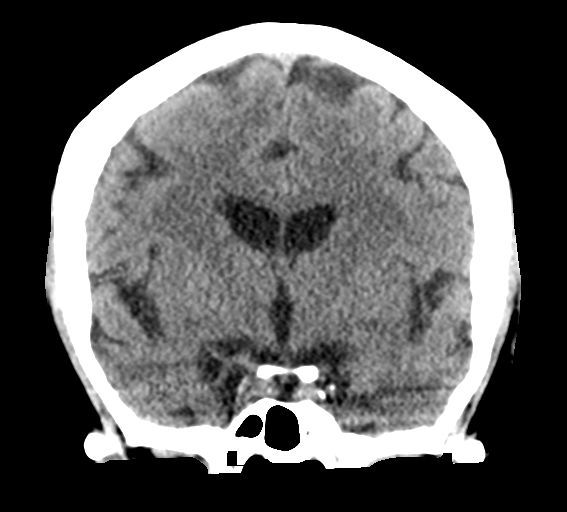

[Series 5: sagittal soft tissue · sagittal · 0.29mm/px · 3 of 52 slices shown]
[im 18/52  brain]
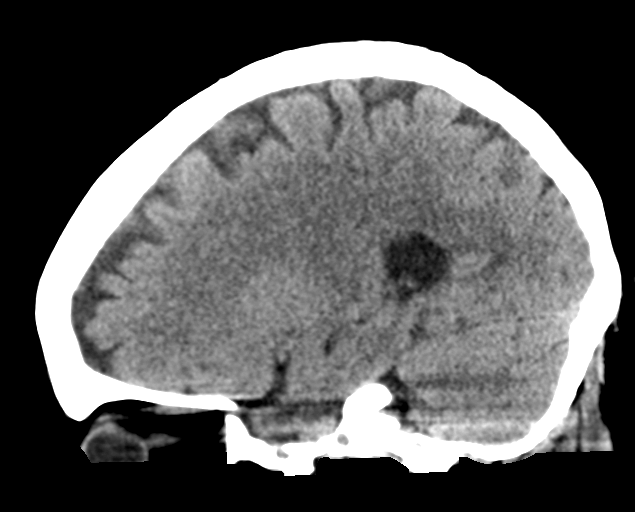
[im 26/52  brain]
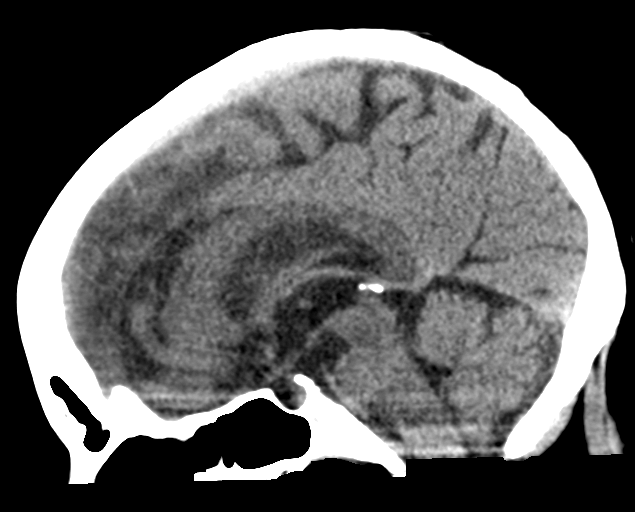
[im 35/52  brain]
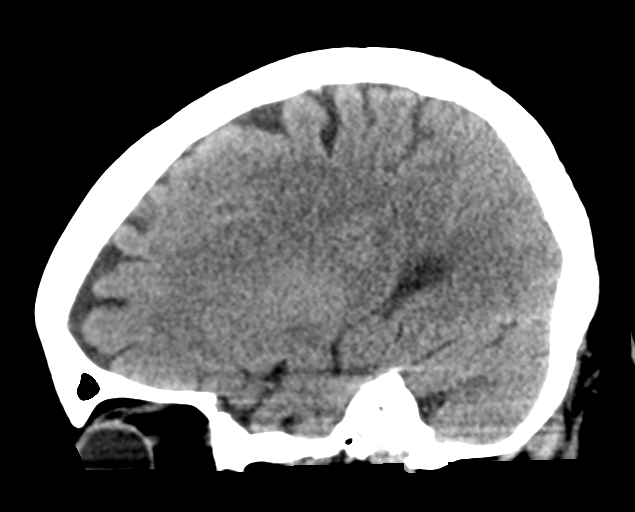

[15 of 45 positions shown; findings below may reference images not displayed]

FINDINGS: Brain: The ventricles are normal in size and configuration. There is
no intracranial mass, hemorrhage, extra-axial fluid collection, or
midline shift. Gray-white compartments appear normal. No evident
acute infarct. Foci of basal ganglia calcification bilaterally is
likely physiologic.

Vascular: No hyperdense vessel. There is slight calcification in the
left carotid siphon.

Skull: Bony calvarium appears intact.

Sinuses/Orbits: There is patchy opacity in the left sphenoid sinus.
Other visualized paranasal sinuses are clear. Visualized orbits
appear symmetric bilaterally.

Other: Mastoid air cells on the left are clear. There is
opacification in several mastoid air cells on the right.
IMPRESSION: No intracranial mass or hemorrhage. Gray-white compartments appear
normal.

There is patchy opacity in the left sphenoid sinus. There is
opacification in several mastoid air cells on the right. There is
slight arterial vascular calcification.

## 2019-09-25 ENCOUNTER — Ambulatory Visit (INDEPENDENT_AMBULATORY_CARE_PROVIDER_SITE_OTHER): Payer: Medicare HMO

## 2019-09-25 ENCOUNTER — Other Ambulatory Visit: Payer: Self-pay

## 2019-09-25 DIAGNOSIS — Z0000 Encounter for general adult medical examination without abnormal findings: Secondary | ICD-10-CM | POA: Diagnosis not present

## 2019-09-25 DIAGNOSIS — Z1231 Encounter for screening mammogram for malignant neoplasm of breast: Secondary | ICD-10-CM | POA: Diagnosis not present

## 2019-09-25 NOTE — Progress Notes (Signed)
Subjective:   [name] is a 54 y.o. female who presents for Medicare Annual (Subsequent) preventive examination.  Virtual Visit via Telephone Note  I connected with [name] on 09/25/19 at  2:50 PM EST by telephone and verified that I am speaking with the correct person using two identifiers.  Medicare Annual Wellness visit completed telephonically due to Covid-19 pandemic.   Location: Patient: home Provider: office   I discussed the limitations, risks, security and privacy concerns of performing an evaluation and management service by telephone and the availability of in person appointments. The patient expressed understanding and agreed to proceed.  Some vital signs may be absent or patient reported.   Stacey Marker, LPN    Review of Systems:   Cardiac Risk Factors include: hypertension;dyslipidemia;diabetes mellitus;obesity (BMI >30kg/m2)     Objective:     Vitals: There were no vitals taken for this visit.  There is no height or weight on file to calculate BMI.  Advanced Directives 09/25/2019 07/25/2019 04/16/2019 04/15/2019 03/04/2019 02/16/2019 01/03/2019  Does Patient Have a Medical Advance Directive? No No No No No No No  Would patient like information on creating a medical advance directive? Yes (MAU/Ambulatory/Procedural Areas - Information given) No - Patient declined No - Patient declined - No - Patient declined No - Patient declined -    Tobacco Social History   Tobacco Use  Smoking Status Never Smoker  Smokeless Tobacco Never Used     Counseling given: Not Answered   Clinical Intake:  Pre-visit preparation completed: Yes  Pain : No/denies pain Pain Score: 0-No pain     Nutritional Risks: None Diabetes: Yes CBG done?: No Did pt. bring in CBG monitor from home?: No   Nutrition Risk Assessment:  Has the patient had any N/V/D within the last 2 months?  Yes - nausea Does the patient have any non-healing wounds?  No  Has the patient had  any unintentional weight loss or weight gain?  No   Diabetes:  Is the patient diabetic?  Yes  If diabetic, was a CBG obtained today?  No  Did the patient bring in their glucometer from home?  No  How often do you monitor your CBG's? 3 times per day.   Financial Strains and Diabetes Management:  Are you having any financial strains with the device, your supplies or your medication? No .  Does the patient want to be seen by Chronic Care Management for management of their diabetes?  No  Would the patient like to be referred to a Nutritionist or for Diabetic Management?  No   Diabetic Exams:  Diabetic Eye Exam: Completed per patient, will contact [facility] to request records.  Diabetic Foot Exam: Completed 10/04/18.   How often do you need to have someone help you when you read instructions, pamphlets, or other written materials from your doctor or pharmacy?: 1 - Never  Interpreter Needed?: No  Information entered by :: Stacey Marker LPN  Past Medical History:  Diagnosis Date  . Anxiety   . Arthritis   . Asthma   . Bipolar depression ([place])   . CHF (congestive heart failure) ([place])   . Chronic headaches    migraines have lessened  . Chronic kidney disease    ddstage 111  . COPD (chronic obstructive pulmonary disease) ([place])   . Coronary artery disease   . Depression   . Diabetic neuropathy ([place])   . Dyspnea    uses inhalers for this  .  Dysrhythmia    rapid  . Fatty liver   . GERD (gastroesophageal reflux disease)   . Headache   . Heart attack ([place]) 2009  . Hyperlipidemia   . Hypertension   . Hypomagnesemia   . Hypothyroid   . Irregular heart rhythm   . Liver lesion    Favored to be benign on CT; MRI of abd w/contract in Aug 2016. Liver Biopsy-Negative, March 2016  . Morbid obesity ([place])   . OSA (obstructive sleep apnea)   . Peripheral vascular disease ([place])   . Pinched nerve    Back  . Pneumonia   . RLS (restless legs syndrome)   . Seasonal allergies    . Sleep apnea    uses oxygen at night 2L d/t cpap did not fit  . Type 2 diabetes, uncontrolled, with neuropathy ([place]) 10/15/2015   Overview:  Microalbumin 7.7 12/2010: Hgb A1c 10.2% 12/2010 and 9.7% 03/2011, eye exam 10/2009 [facility]   . Urinary incontinence   . Vitamin D deficiency    Past Surgical History:  Procedure Laterality Date  . ABDOMINAL HYSTERECTOMY    . ABDOMINAL HYSTERECTOMY     Partial  . bladder stim  2007  . BLADDER SURGERY     x 2. tacking of bladder and the other was the stimulator placement  . CHOLECYSTECTOMY    . COLONOSCOPY    . COLONOSCOPY WITH PROPOFOL N/A 03/04/2019   Procedure: COLONOSCOPY WITH PROPOFOL;  Surgeon: [name], MD;  Location: [facility] ENDOSCOPY;  Service: Endoscopy;  Laterality: N/A;  . ESOPHAGOGASTRODUODENOSCOPY (EGD) WITH PROPOFOL N/A 03/04/2019   Procedure: ESOPHAGOGASTRODUODENOSCOPY (EGD) WITH PROPOFOL;  Surgeon: [name], MD;  Location: [facility] ENDOSCOPY;  Service: Endoscopy;  Laterality: N/A;  . INCISION AND DRAINAGE ABSCESS Right 04/16/2019   Procedure: INCISION AND DRAINAGE ABSCESS;  Surgeon: Benjamine Sprague, DO;  Location: [facility] ORS;  Service: General;  Laterality: Right;  . INTERSTIM-GENERATOR CHANGE N/A 12/31/2017   Procedure: INTERSTIM-GENERATOR CHANGE;  Surgeon: Bjorn Loser, MD;  Location: [facility] ORS;  Service: Urology;  Laterality: N/A;  . LIVER BIOPSY  March 2016   Negative  . LUNG BIOPSY  2015   small spot on lung  . TUBAL LIGATION    . UPPER GI ENDOSCOPY     Family History  Problem Relation Age of Onset  . Diabetes Mother   . Heart disease Mother   . Hyperlipidemia Mother   . Hypertension Mother   . Kidney disease Mother   . Mental illness Mother   . Hypothyroidism Mother   . Stroke Mother   . Osteoporosis Mother   . Glaucoma Mother   . Congestive Heart Failure Mother   . Hypertension Father   . Asthma Daughter   . Cancer Daughter        throat  . Bipolar disorder Daughter   . Heart disease Maternal Grandfather    . Rheum arthritis Maternal Grandfather   . Cancer Maternal Grandfather        liver  . Kidney disease Maternal Grandfather   . Cancer Paternal Grandmother        lung  . Kidney disease Brother   . Breast cancer Maternal Grandmother 60  . Bipolar disorder Daughter   . Hypertension Daughter   . Restless legs syndrome Daughter    Social History   Socioeconomic History  . Marital status: Married    Spouse name: Not on file  . Number of children: 2  . Years of education: 77  . Highest education  level: Not on file  Occupational History  . Occupation: disabled  Tobacco Use  . Smoking status: Never Smoker  . Smokeless tobacco: Never Used  Substance and Sexual Activity  . Alcohol use: No  . Drug use: No  . Sexual activity: Yes    Partners: Male    Birth control/protection: Surgical  Other Topics Concern  . Not on file  Social History Narrative   ** Merged History Encounter **       16-20 oz of caffeine a day       Pt lives with husband and grandchildren   Social Determinants of Health   Financial Resource Strain: Low Risk   . Difficulty of Paying Living Expenses: Not hard at all  Food Insecurity: No Food Insecurity  . Worried About Charity fundraiser in the Last Year: Never true  . Ran Out of Food in the Last Year: Never true  Transportation Needs: No Transportation Needs  . Lack of Transportation (Medical): No  . Lack of Transportation (Non-Medical): No  Physical Activity: Inactive  . Days of Exercise per Week: 0 days  . Minutes of Exercise per Session: 0 min  Stress: Stress Concern Present  . Feeling of Stress : Very much  Social Connections: Somewhat Isolated  . Frequency of Communication with Friends and Family: More than three times a week  . Frequency of Social Gatherings with Friends and Family: More than three times a week  . Attends Religious Services: Never  . Active Member of Clubs or Organizations: No  . Attends Archivist Meetings: Never  .  Marital Status: Married    Outpatient Encounter Medications as of 09/25/2019  Medication Sig  . albuterol (VENTOLIN HFA) 108 (90 Base) MCG/ACT inhaler Inhale 2 puffs into the lungs every 6 (six) hours as needed for wheezing or shortness of breath.  Marland Kitchen atorvastatin (LIPITOR) 80 MG tablet Take 80 mg by mouth at bedtime.   . busPIRone (BUSPAR) 10 MG tablet Take 10 mg by mouth 2 (two) times daily.  . DULoxetine (CYMBALTA) 60 MG capsule Take 120 mg by mouth daily.  . empagliflozin (JARDIANCE) 25 MG TABS tablet Take 25 mg by mouth daily.  Marland Kitchen ezetimibe (ZETIA) 10 MG tablet Take 10 mg by mouth daily.   . fenofibrate micronized (LOFIBRA) 134 MG capsule Take 134 mg by mouth daily.  Marland Kitchen gabapentin (NEURONTIN) 300 MG capsule Take 1 tablet each morning, 1 tablet each afternoon, and 2 tablets at bedtime.  Marland Kitchen HUMULIN R U-500 KWIKPEN 500 UNIT/ML kwikpen Inject 70 Units as directed 3 (three) times daily.   . insulin aspart (NOVOLOG) 100 UNIT/ML injection Inject 5-25 Units into the skin 3 (three) times daily before meals.   . lamoTRIgine (LAMICTAL) 200 MG tablet Take 200 mg by mouth 2 (two) times daily.   Marland Kitchen levothyroxine (SYNTHROID) 50 MCG tablet 1 TAB daily three days a week,  1.5 TABS four days a week (Mon, Wed, Fri, Sat for example)  . lisinopril (ZESTRIL) 2.5 MG tablet TAKE 1 TABLET BY MOUTH EVERY DAY  . metoprolol tartrate (LOPRESSOR) 25 MG tablet TAKE 1 TABLET (25MG TOTAL) IN THE MORNING AND 1/2 TABLET IN THE EVENING  . Omega-3 Fatty Acids (FISH OIL PO) Take by mouth daily.  . ondansetron (ZOFRAN) 4 MG tablet TAKE 1 TABLET BY MOUTH EVERY 8 HOURS AS NEEDED FOR NAUSEA OR VOMITING  . ONETOUCH VERIO test strip   . pantoprazole (PROTONIX) 40 MG tablet Take 1 tablet (40 mg total) by mouth  daily.  . QUEtiapine (SEROQUEL) 400 MG tablet Take 400 mg by mouth at bedtime.  . traMADol (ULTRAM) 50 MG tablet Take 50 mg by mouth every 6 (six) hours as needed.  . traZODone (DESYREL) 100 MG tablet Take 100 mg by mouth at  bedtime.  . TRULICITY 1.5 BF/3.8VA SOPN Inject 1.5 mLs as directed every Sunday.   Marland Kitchen ULTICARE SHORT PEN NEEDLES 31G X 8 MM MISC   . [DISCONTINUED] amoxicillin (AMOXIL) 500 MG tablet Take 500 mg by mouth daily.  . [DISCONTINUED] triamcinolone cream (KENALOG) 0.5 % Apply 1 application topically 2 (two) times daily. Do not use on face of genital area   No facility-administered encounter medications on file as of 09/25/2019.    Activities of Daily Living In your present state of health, do you have any difficulty performing the following activities: 09/25/2019 04/16/2019  Hearing? N N  Comment declines hearing aids -  Vision? N N  Difficulty concentrating or making decisions? N N  Walking or climbing stairs? N N  Dressing or bathing? N N  Doing errands, shopping? N N  Preparing Food and eating ? N -  Using the Toilet? N -  In the past six months, have you accidently leaked urine? N -  Do you have problems with loss of bowel control? N -  Managing your Medications? N -  Managing your Finances? N -  Housekeeping or managing your Housekeeping? N -  Some recent data might be hidden    Patient Care Team: Hubbard Hartshorn, FNP as PCP - General (Family Medicine) [name], MD as Consulting Physician (Gastroenterology) Dagoberto Ligas, MD as Referring Physician (Nephrology) Minna Merritts, MD as Consulting Physician (Cardiology) Chauncey Mann, MD as Referring Physician (Psychiatry) Lenard Simmer, MD as Attending Physician (Endocrinology)    Assessment:   This is a routine wellness examination for [place].  Exercise Activities and Dietary recommendations Current Exercise Habits: The patient does not participate in regular exercise at present, Exercise limited by: orthopedic condition(s)  Goals    . Weight (lb) < 180 lb (81.6 kg) (pt-stated)       Fall Risk Fall Risk  09/25/2019 08/12/2019 04/09/2019 02/20/2019 12/24/2018  Falls in the past year? 0 0 0 1 1  Comment - - - - -   Number falls in past yr: 0 0 0 1 -  Comment - - - - -  Injury with Fall? 0 0 0 0 -  Comment - - - - -  Risk for fall due to : No Fall Risks - - - -  Follow up Falls prevention discussed Falls evaluation completed - - -   FALL RISK PREVENTION PERTAINING TO THE HOME:  Any stairs in or around the home? Yes  If so, do they handrails? Yes   Home free of loose throw rugs in walkways, pet beds, electrical cords, etc? Yes  Adequate lighting in your home to reduce risk of falls? Yes   ASSISTIVE DEVICES UTILIZED TO PREVENT FALLS:  Life alert? No  Use of a cane, walker or w/c? No  Grab bars in the bathroom? No  Shower chair or bench in shower? Yes  Elevated toilet seat or a handicapped toilet? No  DME ORDERS:  DME order needed?  No   TIMED UP AND GO:  Was the test performed? No . Telephonic visit.   Education: Fall risk prevention has been discussed.  Intervention(s) required? No    Depression Screen PHQ 2/9 Scores 09/25/2019 08/12/2019  04/09/2019 02/20/2019  PHQ - 2 Score 0 0 0 0  PHQ- 9 Score - 2 0 0     Cognitive Function - PT declined 6CIT for 2021 AWV     6CIT Screen 10/30/2017  What Year? 0 points  What month? 0 points  What time? 0 points  Count back from 20 2 points  Months in reverse 0 points  Repeat phrase 2 points  Total Score 4    Immunization History  Administered Date(s) Administered  . Influenza Split 10/06/2013  . Influenza Whole 07/17/2007, 06/08/2008, 09/23/2011  . Influenza, Seasonal, Injecte, Preservative Fre 09/26/2012  . Influenza,inj,Quad PF,6+ Mos 05/13/2015, 09/28/2016, 07/18/2018  . Influenza-Unspecified 05/26/2014, 05/13/2015, 09/28/2016, 07/18/2018  . Pneumococcal Conjugate-13 09/23/2011, 11/16/2011  . Pneumococcal Polysaccharide-23 11/15/2007, 05/09/2013  . Tdap 05/13/2012, 04/01/2014    Qualifies for Shingles Vaccine? Yes . Due for Shingrix. Education has been provided regarding the importance of this vaccine. Pt has been advised to call  insurance company to determine out of pocket expense. Advised may also receive vaccine at local pharmacy or Health Dept. Verbalized acceptance and understanding.  Tdap: Up to date  Flu Vaccine: Due for Flu vaccine. Does the patient want to receive this vaccine today?  No . Education has been provided regarding the importance of this vaccine but still declined. Advised may receive this vaccine at local pharmacy or Health Dept. Aware to provide a copy of the vaccination record if obtained from local pharmacy or Health Dept. Verbalized acceptance and understanding.  Pneumococcal Vaccine: Up to date   Screening Tests Health Maintenance  Topic Date Due  . OPHTHALMOLOGY EXAM  10/23/2018  . MAMMOGRAM  11/06/2018  . INFLUENZA VACCINE  04/05/2019  . PAP SMEAR-Modifier  09/29/2019  . FOOT EXAM  10/05/2019  . HEMOGLOBIN A1C  10/16/2019  . COLONOSCOPY  03/03/2024  . TETANUS/TDAP  04/01/2024  . PNEUMOCOCCAL POLYSACCHARIDE VACCINE AGE 61-64 HIGH RISK  Completed  . Hepatitis C Screening  Completed  . HIV Screening  Completed    Cancer Screenings:  Colorectal Screening: Completed 03/04/19.Marland Kitchen Repeat every 5 years;   Mammogram: Completed 11/05/17. Repeat every year. Ordered today. Pt provided with contact information and advised to call to schedule appt.   Bone Density: due at age 18.   Lung Cancer Screening: (Low Dose CT Chest recommended if Age 29-80 years, 30 pack-year currently smoking OR have quit w/in 15years.) does not qualify.    Additional Screening:  Hepatitis C Screening: does qualify; Completed 01/05/2009  Vision Screening: Recommended annual ophthalmology exams for early detection of glaucoma and other disorders of the eye. Is the patient up to date with their annual eye exam?  Yes  Who is the provider or what is the name of the office in which the pt attends annual eye exams? [facility] Screening: Recommended annual dental exams for proper oral hygiene  Community  Resource Referral:  CRR required this visit?  No      Plan:     I have personally reviewed and addressed the Medicare Annual Wellness questionnaire and have noted the following in the patient's chart:  A. Medical and social history B. Use of alcohol, tobacco or illicit drugs  C. Current medications and supplements D. Functional ability and status E.  Nutritional status F.  Physical activity G. Advance directives H. List of other physicians I.  Hospitalizations, surgeries, and ER visits in previous 12 months J.  [place] such as hearing and vision if needed, cognitive and  depression L. Referrals and appointments   In addition, I have reviewed and discussed with patient certain preventive protocols, quality metrics, and best practice recommendations. A written personalized care plan for preventive services as well as general preventive health recommendations were provided to patient.   Signed,  Stacey Marker, LPN Nurse Health Advisor   Nurse Notes: pt states she has been seen by endocrinology Dr. Carmela Rima in November and has upcoming appt in February and will have records sent.

## 2019-09-25 NOTE — Patient Instructions (Signed)
[name] , Thank you for taking time to come for your Medicare Wellness Visit. I appreciate your ongoing commitment to your health goals. Please review the following plan we discussed and let me know if I can assist you in the future.   Screening recommendations/referrals: Colonoscopy: done 03/04/19. Repeat in 2025.  Mammogram: done 11/15/17. Please call [phone] to schedule your mammogram.   Recommended yearly ophthalmology/optometry visit for glaucoma screening and checkup Recommended yearly dental visit for hygiene and checkup  Vaccinations: Influenza vaccine: due Pneumococcal vaccine: done 05/09/13 Tdap vaccine: done 04/01/14 Shingles vaccine: Shingrix discussed. Please contact your pharmacy for coverage information.   Advanced directives: Advance directive discussed with you today. I have provided a copy for you to complete at home and have notarized. Once this is complete please bring a copy in to our office so we can scan it into your chart.  Conditions/risks identified: Recommend increasing physical activity   Next appointment: Please follow up in one year for your Medicare Annual Wellness visit.    Preventive Care 40-64 Years, Female Preventive care refers to lifestyle choices and visits with your health care provider that can promote health and wellness. What does preventive care include?  A yearly physical exam. This is also called an annual well check.  Dental exams once or twice a year.  Routine eye exams. Ask your health care provider how often you should have your eyes checked.  Personal lifestyle choices, including:  Daily care of your teeth and gums.  Regular physical activity.  Eating a healthy diet.  Avoiding tobacco and drug use.  Limiting alcohol use.  Practicing safe sex.  Taking low-dose aspirin daily starting at age 22.  Taking vitamin and mineral supplements as recommended by your health care provider. What happens during an annual well  check? The services and screenings done by your health care provider during your annual well check will depend on your age, overall health, lifestyle risk factors, and family history of disease. Counseling  Your health care provider may ask you questions about your:  Alcohol use.  Tobacco use.  Drug use.  Emotional well-being.  Home and relationship well-being.  Sexual activity.  Eating habits.  Work and work Statistician.  Method of birth control.  Menstrual cycle.  Pregnancy history. Screening  You may have the following tests or measurements:  Height, weight, and BMI.  Blood pressure.  Lipid and cholesterol levels. These may be checked every 5 years, or more frequently if you are over 62 years old.  Skin check.  Lung cancer screening. You may have this screening every year starting at age 38 if you have a 30-pack-year history of smoking and currently smoke or have quit within the past 15 years.  Fecal occult blood test (FOBT) of the stool. You may have this test every year starting at age 70.  Flexible sigmoidoscopy or colonoscopy. You may have a sigmoidoscopy every 5 years or a colonoscopy every 10 years starting at age 101.  Hepatitis C blood test.  Hepatitis B blood test.  Sexually transmitted disease (STD) testing.  Diabetes screening. This is done by checking your blood sugar (glucose) after you have not eaten for a while (fasting). You may have this done every 1-3 years.  Mammogram. This may be done every 1-2 years. Talk to your health care provider about when you should start having regular mammograms. This may depend on whether you have a family history of breast cancer.  BRCA-related cancer screening. This may be done  if you have a family history of breast, ovarian, tubal, or peritoneal cancers.  Pelvic exam and Pap test. This may be done every 3 years starting at age 54. Starting at age 3, this may be done every 5 years if you have a Pap test in  combination with an HPV test.  Bone density scan. This is done to screen for osteoporosis. You may have this scan if you are at high risk for osteoporosis. Discuss your test results, treatment options, and if necessary, the need for more tests with your health care provider. Vaccines  Your health care provider may recommend certain vaccines, such as:  Influenza vaccine. This is recommended every year.  Tetanus, diphtheria, and acellular pertussis (Tdap, Td) vaccine. You may need a Td booster every 10 years.  Zoster vaccine. You may need this after age 34.  Pneumococcal 13-valent conjugate (PCV13) vaccine. You may need this if you have certain conditions and were not previously vaccinated.  Pneumococcal polysaccharide (PPSV23) vaccine. You may need one or two doses if you smoke cigarettes or if you have certain conditions. Talk to your health care provider about which screenings and vaccines you need and how often you need them. This information is not intended to replace advice given to you by your health care provider. Make sure you discuss any questions you have with your health care provider. Document Released: 09/17/2015 Document Revised: 05/10/2016 Document Reviewed: 06/22/2015 Elsevier Interactive Patient Education  2017 [place] Prevention in the Home Falls can cause injuries. They can happen to people of all ages. There are many things you can do to make your home safe and to help prevent falls. What can I do on the outside of my home?  Regularly fix the edges of walkways and driveways and fix any cracks.  Remove anything that might make you trip as you walk through a door, such as a raised step or threshold.  Trim any bushes or trees on the path to your home.  Use bright outdoor lighting.  Clear any walking paths of anything that might make someone trip, such as rocks or tools.  Regularly check to see if handrails are loose or broken. Make sure that both  sides of any steps have handrails.  Any raised decks and porches should have guardrails on the edges.  Have any leaves, snow, or ice cleared regularly.  Use sand or salt on walking paths during winter.  Clean up any spills in your garage right away. This includes oil or grease spills. What can I do in the bathroom?  Use night lights.  Install grab bars by the toilet and in the tub and shower. Do not use towel bars as grab bars.  Use non-skid mats or decals in the tub or shower.  If you need to sit down in the shower, use a plastic, non-slip stool.  Keep the floor dry. Clean up any water that spills on the floor as soon as it happens.  Remove soap buildup in the tub or shower regularly.  Attach bath mats securely with double-sided non-slip rug tape.  Do not have throw rugs and other things on the floor that can make you trip. What can I do in the bedroom?  Use night lights.  Make sure that you have a light by your bed that is easy to reach.  Do not use any sheets or blankets that are too big for your bed. They should not hang down onto the floor.  Have a firm chair that has side arms. You can use this for support while you get dressed.  Do not have throw rugs and other things on the floor that can make you trip. What can I do in the kitchen?  Clean up any spills right away.  Avoid walking on wet floors.  Keep items that you use a lot in easy-to-reach places.  If you need to reach something above you, use a strong step stool that has a grab bar.  Keep electrical cords out of the way.  Do not use floor polish or wax that makes floors slippery. If you must use wax, use non-skid floor wax.  Do not have throw rugs and other things on the floor that can make you trip. What can I do with my stairs?  Do not leave any items on the stairs.  Make sure that there are handrails on both sides of the stairs and use them. Fix handrails that are broken or loose. Make sure that  handrails are as long as the stairways.  Check any carpeting to make sure that it is firmly attached to the stairs. Fix any carpet that is loose or worn.  Avoid having throw rugs at the top or bottom of the stairs. If you do have throw rugs, attach them to the floor with carpet tape.  Make sure that you have a light switch at the top of the stairs and the bottom of the stairs. If you do not have them, ask someone to add them for you. What else can I do to help prevent falls?  Wear shoes that:  Do not have high heels.  Have rubber bottoms.  Are comfortable and fit you well.  Are closed at the toe. Do not wear sandals.  If you use a stepladder:  Make sure that it is fully opened. Do not climb a closed stepladder.  Make sure that both sides of the stepladder are locked into place.  Ask someone to hold it for you, if possible.  Clearly mark and make sure that you can see:  Any grab bars or handrails.  First and last steps.  Where the edge of each step is.  Use tools that help you move around (mobility aids) if they are needed. These include:  Canes.  Walkers.  Scooters.  Crutches.  Turn on the lights when you go into a dark area. Replace any light bulbs as soon as they burn out.  Set up your furniture so you have a clear path. Avoid moving your furniture around.  If any of your floors are uneven, fix them.  If there are any pets around you, be aware of where they are.  Review your medicines with your doctor. Some medicines can make you feel dizzy. This can increase your chance of falling. Ask your doctor what other things that you can do to help prevent falls. This information is not intended to replace advice given to you by your health care provider. Make sure you discuss any questions you have with your health care provider. Document Released: 06/17/2009 Document Revised: 01/27/2016 Document Reviewed: 09/25/2014 Elsevier Interactive Patient Education  2017  [employer].

## 2019-10-27 ENCOUNTER — Emergency Department
Admission: EM | Admit: 2019-10-27 | Discharge: 2019-10-27 | Disposition: A | Discharge status: Home / Self Care | Payer: Medicare HMO | Attending: Emergency Medicine | Admitting: Emergency Medicine

## 2019-10-27 ENCOUNTER — Other Ambulatory Visit: Payer: Self-pay

## 2019-10-27 ENCOUNTER — Emergency Department: Payer: Medicare HMO

## 2019-10-27 DIAGNOSIS — E039 Hypothyroidism, unspecified: Secondary | ICD-10-CM | POA: Insufficient documentation

## 2019-10-27 DIAGNOSIS — I509 Heart failure, unspecified: Secondary | ICD-10-CM | POA: Insufficient documentation

## 2019-10-27 DIAGNOSIS — R1030 Lower abdominal pain, unspecified: Secondary | ICD-10-CM | POA: Diagnosis not present

## 2019-10-27 DIAGNOSIS — Z794 Long term (current) use of insulin: Secondary | ICD-10-CM | POA: Diagnosis not present

## 2019-10-27 DIAGNOSIS — E1122 Type 2 diabetes mellitus with diabetic chronic kidney disease: Secondary | ICD-10-CM | POA: Insufficient documentation

## 2019-10-27 DIAGNOSIS — K5900 Constipation, unspecified: Secondary | ICD-10-CM

## 2019-10-27 DIAGNOSIS — R109 Unspecified abdominal pain: Secondary | ICD-10-CM

## 2019-10-27 DIAGNOSIS — I130 Hypertensive heart and chronic kidney disease with heart failure and stage 1 through stage 4 chronic kidney disease, or unspecified chronic kidney disease: Secondary | ICD-10-CM | POA: Diagnosis not present

## 2019-10-27 DIAGNOSIS — N183 Chronic kidney disease, stage 3 unspecified: Secondary | ICD-10-CM | POA: Insufficient documentation

## 2019-10-27 DIAGNOSIS — E1140 Type 2 diabetes mellitus with diabetic neuropathy, unspecified: Secondary | ICD-10-CM | POA: Insufficient documentation

## 2019-10-27 DIAGNOSIS — Z79899 Other long term (current) drug therapy: Secondary | ICD-10-CM | POA: Insufficient documentation

## 2019-10-27 LAB — URINALYSIS, COMPLETE (UACMP) WITH MICROSCOPIC
Bacteria, UA: NONE SEEN
Bilirubin Urine: NEGATIVE
Glucose, UA: >=500 mg/dL — AB
Ketones, ur: NEGATIVE mg/dL
Leukocytes,Ua: NEGATIVE
Nitrite: NEGATIVE
Protein, ur: NEGATIVE mg/dL
Specific Gravity, Urine: 1.031 — ABNORMAL HIGH (ref 1.005–1.030)
pH: 5 (ref 5.0–8.0)

## 2019-10-27 LAB — CBC
HCT: 39.9 % (ref 36.0–46.0)
Hemoglobin: 12.7 g/dL (ref 12.0–15.0)
MCH: 28.9 pg (ref 26.0–34.0)
MCHC: 31.8 g/dL (ref 30.0–36.0)
MCV: 90.7 fL (ref 80.0–100.0)
Platelets: 271 10*3/uL (ref 150–400)
RBC: 4.4 MIL/uL (ref 3.87–5.11)
RDW: 13.9 % (ref 11.5–15.5)
WBC: 10.2 10*3/uL (ref 4.0–10.5)
nRBC: 0 % (ref 0.0–0.2)

## 2019-10-27 LAB — COMPREHENSIVE METABOLIC PANEL
ALT: 35 U/L (ref 0–44)
AST: 39 U/L (ref 15–41)
Albumin: 4.1 g/dL (ref 3.5–5.0)
Alkaline Phosphatase: 50 U/L (ref 38–126)
Anion gap: 13 (ref 5–15)
BUN: 36 mg/dL — ABNORMAL HIGH (ref 6–20)
CO2: 24 mmol/L (ref 22–32)
Calcium: 9.1 mg/dL (ref 8.9–10.3)
Chloride: 99 mmol/L (ref 98–111)
Creatinine, Ser: 1.26 mg/dL — ABNORMAL HIGH (ref 0.44–1.00)
GFR calc Af Amer: 55 mL/min — ABNORMAL LOW (ref >60)
GFR calc non Af Amer: 48 mL/min — ABNORMAL LOW (ref >60)
Glucose, Bld: 353 mg/dL — ABNORMAL HIGH (ref 70–99)
Potassium: 4.9 mmol/L (ref 3.5–5.1)
Sodium: 136 mmol/L (ref 135–145)
Total Bilirubin: 0.7 mg/dL (ref 0.3–1.2)
Total Protein: 6.7 g/dL (ref 6.5–8.1)

## 2019-10-27 LAB — LIPASE, BLOOD: Lipase: 51 U/L (ref 11–51)

## 2019-10-27 MED ORDER — SODIUM CHLORIDE 0.9 % IV BOLUS
1000.0000 mL | Freq: Once | INTRAVENOUS | Status: AC
  Administered 2019-10-27: 1000 mL via INTRAVENOUS

## 2019-10-27 MED ORDER — SODIUM CHLORIDE 0.9% FLUSH: 3.0000 mL | Freq: Once | INTRAVENOUS | Status: DC

## 2019-10-27 MED ORDER — IOHEXOL 300 MG/ML  SOLN
100.0000 mL | Freq: Once | INTRAMUSCULAR | Status: AC | PRN
  Administered 2019-10-27: 14:00:00 100 mL via INTRAVENOUS
  Filled 2019-10-27: qty 100

## 2019-10-27 MED ORDER — HALOPERIDOL LACTATE 5 MG/ML IJ SOLN
5.0000 mg | Freq: Once | INTRAMUSCULAR | Status: AC
  Administered 2019-10-27: 14:00:00 5 mg via INTRAVENOUS
  Filled 2019-10-27: qty 1

## 2019-10-27 MED ORDER — POLYETHYLENE GLYCOL 3350 17 G PO PACK: 17.0000 g | PACK | Freq: Every day | ORAL | 0 refills | Status: DC

## 2019-10-27 MED ORDER — MAGNESIUM CITRATE PO SOLN: 1.0000 | Freq: Once | ORAL | 1 refills | Status: AC

## 2019-10-27 NOTE — ED Notes (Signed)
See triage note. Pt in NAD currently. Sitting calmly in bed. Reports history of hemorrhoids and states has recently been a bit constipated. Reports both LLQ and RLQ abdominal pain and lower back pain. Denies burning on urination. Denies history of kidney stones.

## 2019-10-27 NOTE — ED Triage Notes (Signed)
Pt comes via POV from home with c/o of lower abdominal pain and nausea that started yesterday.  Pt states this am she noticed blood. Pt states she doesn't know if it is from her urine or stool.  Pt denies any burning with urination. Pt also states lower back pain.

## 2019-10-27 NOTE — ED Notes (Signed)
AOx3.  Skin warm and dry. NAD

## 2019-10-27 NOTE — ED Provider Notes (Signed)
[facility] Emergency Department Provider Note   ____________________________________________   I have reviewed the triage vital signs and the nursing notes.   HISTORY  Chief Complaint Abdominal Pain   History limited by: Not Limited   HPI [name] is a 54 y.o. female who presents to the emergency department today because of concern for abdominal pain and back pain. She states that the pain started yesterday. Located across her lower back and lower abdomen. The patient says that four days ago she had a fall, has had multiple falls this month. Did not have pain at the time of the fall but says since then has been staying in her bed and only getting up to use the restroom. The patient denies any fevers. Has had associated nausea with the abdominal pain.    Records reviewed. Per medical record review patient has a history of COPD, CHF, diabetes.   Past Medical History:  Diagnosis Date  . Anxiety   . Arthritis   . Asthma   . Bipolar depression ([place])   . CHF (congestive heart failure) ([place])   . Chronic headaches    migraines have lessened  . Chronic kidney disease    ddstage 111  . COPD (chronic obstructive pulmonary disease) ([place])   . Coronary artery disease   . Depression   . Diabetic neuropathy ([place])   . Dyspnea    uses inhalers for this  . Dysrhythmia    rapid  . Fatty liver   . GERD (gastroesophageal reflux disease)   . Headache   . Heart attack ([place]) 2009  . Hyperlipidemia   . Hypertension   . Hypomagnesemia   . Hypothyroid   . Irregular heart rhythm   . Liver lesion    Favored to be benign on CT; MRI of abd w/contract in Aug 2016. Liver Biopsy-Negative, March 2016  . Morbid obesity ([place])   . OSA (obstructive sleep apnea)   . Peripheral vascular disease ([place])   . Pinched nerve    Back  . Pneumonia   . RLS (restless legs syndrome)   . Seasonal allergies   . Sleep apnea    uses oxygen at night 2L d/t cpap did not fit  . Type 2  diabetes, uncontrolled, with neuropathy ([place]) 10/15/2015   Overview:  Microalbumin 7.7 12/2010: Hgb A1c 10.2% 12/2010 and 9.7% 03/2011, eye exam 10/2009 [facility] DM clinic   . Urinary incontinence   . Vitamin D deficiency     Patient Active Problem List   Diagnosis Date Noted  . Abscess of axilla, right 04/16/2019  . Iron deficiency anemia due to chronic blood loss   . Polyp of ascending colon   . Chronic gastric ulcer with hemorrhage   . Type 2 diabetes with decreased circulation ([place]) 11/07/2018  . Vitamin B12 deficiency 08/08/2018  . Encounter for Medicare annual wellness exam 10/30/2017  . Patient is full code 10/30/2017  . Disorder of bursae of shoulder region 10/09/2017  . Coronary artery disease 10/09/2017  . Plantar fascial fibromatosis 10/09/2017  . Tendinitis of wrist 10/09/2017  . Schizoaffective disorder, bipolar type ([place]) 09/28/2017  . Chest pain 09/10/2017  . Pleuritic chest pain 05/27/2017  . Hypertriglyceridemia 12/21/2016  . Pansinusitis 11/15/2016  . 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 Breast cancer screening 09/28/2016  . Urine frequency 09/28/2016  . Preventative health care 09/28/2016  . Fatigue 05/01/2016  . Urinary tract infection 03/14/2016  . Adenomatous colon polyp 10/15/2015  .  Airway hyperreactivity 10/15/2015  . Bipolar affective disorder ([place]) 10/15/2015  . CN (constipation) 10/15/2015  . Drug-induced hepatic toxicity 10/15/2015  . Blood in feces 10/15/2015  . HLD (hyperlipidemia) 10/15/2015  . Hypoxemia 10/15/2015  . NASH (nonalcoholic steatohepatitis) 10/15/2015  . Morbid obesity ([place]) 10/15/2015  . Restless leg syndrome 10/15/2015  . Type 2 diabetes, uncontrolled, with neuropathy ([place]) 10/15/2015  . Elevated alkaline phosphatase level 09/22/2015  . Urinary bladder incontinence 09/22/2015  . Low back pain with left-sided sciatica 08/18/2015  . Encounter for monitoring tricyclic antidepressant therapy 05/13/2015  . Bipolar  disorder ([place]) 05/11/2015  . Apnea, sleep 05/05/2015  . Heart valve disease 05/05/2015  . Chronic kidney disease, stage III (moderate) 03/15/2015  . Migraine 03/12/2015  . COPD (chronic obstructive pulmonary disease) ([place]) 01/20/2015  . 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 GERD (gastroesophageal reflux disease) 01/20/2015  . Hypothyroid   . Vitamin D deficiency   . Urinary incontinence   . Liver lesion   . Hypomagnesemia   . Obstructive sleep apnea 10/20/2013  . Vaginitis 12/19/2011  . PERIPHERAL NEUROPATHY 03/04/2009  . HEMORRHOIDS, EXTERNAL 09/03/2008  . ASTHMA, PERSISTENT, MODERATE 07/04/2007  . INTERSTITIAL CYSTITIS 07/04/2007  . HYPERTENSION, BENIGN ESSENTIAL 06/19/2007    Past Surgical History:  Procedure Laterality Date  . ABDOMINAL HYSTERECTOMY    . ABDOMINAL HYSTERECTOMY     Partial  . bladder stim  2007  . BLADDER SURGERY     x 2. tacking of bladder and the other was the stimulator placement  . CHOLECYSTECTOMY    . COLONOSCOPY    . COLONOSCOPY WITH PROPOFOL N/A 03/04/2019   Procedure: COLONOSCOPY WITH PROPOFOL;  Surgeon: [name], MD;  [facility] ENDOSCOPY;  Service: Endoscopy;  Laterality: N/A;  . ESOPHAGOGASTRODUODENOSCOPY (EGD) WITH PROPOFOL N/A 03/04/2019   Procedure: ESOPHAGOGASTRODUODENOSCOPY (EGD) WITH PROPOFOL;  Surgeon: [name], MD;  Location: [facility] ENDOSCOPY;  Service: Endoscopy;  Laterality: N/A;  . INCISION AND DRAINAGE ABSCESS Right 04/16/2019   Procedure: INCISION AND DRAINAGE ABSCESS;  Surgeon: Benjamine Sprague, DO;  Location: [facility] ORS;  Service: General;  Laterality: Right;  . INTERSTIM-GENERATOR CHANGE N/A 12/31/2017   Procedure: INTERSTIM-GENERATOR CHANGE;  Surgeon: Bjorn Loser, MD;  Location: [facility] ORS;  Service: Urology;  Laterality: N/A;  . LIVER BIOPSY  March 2016   Negative  . LUNG BIOPSY  2015   small spot on lung  . TUBAL LIGATION    . UPPER GI ENDOSCOPY      Prior to Admission medications   Medication Sig Start  Date End Date Taking? Authorizing Provider  albuterol (VENTOLIN HFA) 108 (90 Base) MCG/ACT inhaler Inhale 2 puffs into the lungs every 6 (six) hours as needed for wheezing or shortness of breath. 08/12/19   Hubbard Hartshorn, FNP  atorvastatin (LIPITOR) 80 MG tablet Take 80 mg by mouth at bedtime.     [provider]  busPIRone (BUSPAR) 10 MG tablet Take 10 mg by mouth 2 (two) times daily.    [provider]  DULoxetine (CYMBALTA) 60 MG capsule Take 120 mg by mouth daily.    [provider]  empagliflozin (JARDIANCE) 25 MG TABS tablet Take 25 mg by mouth daily.    [provider]  ezetimibe (ZETIA) 10 MG tablet Take 10 mg by mouth daily.  01/05/17   [provider]  fenofibrate micronized (LOFIBRA) 134 MG capsule Take 134 mg by mouth daily.    [provider]  gabapentin (NEURONTIN) 300 MG capsule Take 1 tablet each  morning, 1 tablet each afternoon, and 2 tablets at bedtime. 08/12/19   Hubbard Hartshorn, FNP  HUMULIN R U-500 KWIKPEN 500 UNIT/ML kwikpen Inject 70 Units as directed 3 (three) times daily.     [provider]  insulin aspart (NOVOLOG) 100 UNIT/ML injection Inject 5-25 Units into the skin 3 (three) times daily before meals.     [provider]  lamoTRIgine (LAMICTAL) 200 MG tablet Take 200 mg by mouth 2 (two) times daily.     [provider]  levothyroxine (SYNTHROID) 50 MCG tablet 1 TAB daily three days a week,  1.5 TABS four days a week (Mon, Wed, Fri, Sat for example) 09/04/19   Hubbard Hartshorn, FNP  lisinopril (ZESTRIL) 2.5 MG tablet TAKE 1 TABLET BY MOUTH EVERY DAY 07/08/19   Hubbard Hartshorn, FNP  metoprolol tartrate (LOPRESSOR) 25 MG tablet TAKE 1 TABLET (25MG TOTAL) IN THE MORNING AND 1/2 TABLET IN THE EVENING 08/19/19   Sowles, Drue Stager, MD  Omega-3 Fatty Acids (FISH OIL PO) Take by mouth daily.    [provider]  ondansetron (ZOFRAN) 4 MG tablet TAKE 1 TABLET BY MOUTH EVERY 8 HOURS AS NEEDED FOR NAUSEA  OR VOMITING 09/15/19   [name], MD  [facility] VERIO test strip  08/18/19   [provider]  pantoprazole (PROTONIX) 40 MG tablet Take 1 tablet (40 mg total) by mouth daily. 08/20/19   [name], MD  QUEtiapine (SEROQUEL) 400 MG tablet Take 400 mg by mouth at bedtime.    [provider]  traMADol (ULTRAM) 50 MG tablet Take 50 mg by mouth every 6 (six) hours as needed. 08/12/19   [provider]  traZODone (DESYREL) 100 MG tablet Take 100 mg by mouth at bedtime.    [provider]  TRULICITY 1.5 WF/0.9NA SOPN Inject 1.5 mLs as directed every Sunday.  03/26/18   [provider]  ULTICARE SHORT PEN NEEDLES 31G X 8 MM [place]  09/10/19   [provider]    Allergies Mirtazapine, Morphine and related, Sulfamethoxazole-trimethoprim, Amoxicillin er, Codeine, Hydrocodone-acetaminophen, Keflex [cephalexin], and Prasterone  Family History  Problem Relation Age of Onset  . Diabetes Mother   . Heart disease Mother   . Hyperlipidemia Mother   . Hypertension Mother   . Kidney disease Mother   . Mental illness Mother   . Hypothyroidism Mother   . Stroke Mother   . Osteoporosis Mother   . Glaucoma Mother   . Congestive Heart Failure Mother   . Hypertension Father   . Asthma Daughter   . Cancer Daughter        throat  . Bipolar disorder Daughter   . Heart disease Maternal Grandfather   . Rheum arthritis Maternal Grandfather   . Cancer Maternal Grandfather        liver  . Kidney disease Maternal Grandfather   . Cancer Paternal Grandmother        lung  . Kidney disease Brother   . Breast cancer Maternal Grandmother 60  . Bipolar disorder Daughter   . Hypertension Daughter   . Restless legs syndrome Daughter     Social History Social History   Tobacco Use  . Smoking status: Never Smoker  . Smokeless tobacco: Never Used  Substance Use Topics  . Alcohol use: No  . Drug use: No    Review of Systems Constitutional: No  fever/chills Eyes: No visual changes. ENT: No sore throat. Cardiovascular: Denies chest pain. Respiratory: Denies shortness of breath. Gastrointestinal:  Positive for abdominal pain, nausea.  Genitourinary: Negative for dysuria. Musculoskeletal: Positive for back pain. Skin: Negative for rash. Neurological: Negative for headaches, focal weakness or numbness.  ____________________________________________   PHYSICAL EXAM:  VITAL SIGNS: ED Triage Vitals  Enc Vitals Group     BP 10/27/19 1144 137/77     Pulse Rate 10/27/19 1144 82     Resp 10/27/19 1144 18     Temp 10/27/19 1144 98.4 F (36.9 C)     Temp src --      SpO2 10/27/19 1144 97 %     Weight 10/27/19 1145 200 lb (90.7 kg)     Height 10/27/19 1145 4' 11"  (1.499 m)     Head Circumference --      Peak Flow --      Pain Score 10/27/19 1145 8   Constitutional: Alert and oriented.  Eyes: Conjunctivae are normal.  ENT      Head: Normocephalic and atraumatic.      Nose: No congestion/rhinnorhea.      Mouth/Throat: Mucous membranes are moist.      Neck: No stridor. Hematological/Lymphatic/Immunilogical: No cervical lymphadenopathy. Cardiovascular: Normal rate, regular rhythm.  No murmurs, rubs, or gallops.  Respiratory: Normal respiratory effort without tachypnea nor retractions. Breath sounds are clear and equal bilaterally. No wheezes/rales/rhonchi. Gastrointestinal: Soft and slightly tender in the left side of the abdomen.  Genitourinary: Deferred Musculoskeletal: Normal range of motion in all extremities. No lower extremity edema. Neurologic:  Normal speech and language. No gross focal neurologic deficits are appreciated.  Skin:  Skin is warm, dry and intact. No rash noted. Psychiatric: Mood and affect are normal. Speech and behavior are normal. Patient exhibits appropriate insight and judgment.  ____________________________________________    LABS (pertinent positives/negatives)  Lipase 51 CBC wbc 10.2, hgb  12.7, plt 271 CMP wnl except glu 353, bun 36, cr 1.26 UA clear, small hgb dipstick, 0-5 rbc and wbc ____________________________________________   EKG  None  ____________________________________________    RADIOLOGY  CT abd/pel No acute abnormality, moderate amount of stool. ____________________________________________   PROCEDURES  Procedures  ____________________________________________   INITIAL IMPRESSION / ASSESSMENT AND PLAN / ED COURSE  Pertinent labs & imaging results that were available during my care of the patient were reviewed by me and considered in my medical decision making (see chart for details).   Patient presented to the emergency department today because of concerns for abdominal pain.  On exam she did have some tenderness in the left abdomen.  I did have concerns for possible diverticulitis or other intra-abdominal infection.  CT scan was obtained.  This did not show any concerning findings however did show fair amount of stool.  I discussed this finding with the patient.  Will plan on discharging with magnesium citrate and MiraLAX.  Additionally patient had good pain relief with Haldol and IV fluids.  ____________________________________________   FINAL CLINICAL IMPRESSION(S) / ED DIAGNOSES  Final diagnoses:  Abdominal pain, unspecified abdominal location  Constipation, unspecified constipation type     Note: This dictation was prepared with Dragon dictation. Any transcriptional errors that result from this process are unintentional     Nance Pear, MD 10/27/19 [phone]

## 2019-10-27 NOTE — Discharge Instructions (Addendum)
Please seek medical attention for any high fevers, chest pain, shortness of breath, change in behavior, persistent vomiting, bloody stool or any other new or concerning symptoms.  

## 2019-10-27 NOTE — ED Notes (Signed)
Return from CT scan.  AAOx3  Skin warm and dry. NAD

## 2019-11-02 ENCOUNTER — Other Ambulatory Visit: Payer: Self-pay

## 2019-11-02 ENCOUNTER — Emergency Department
Admission: EM | Admit: 2019-11-02 | Discharge: 2019-11-02 | Disposition: A | Discharge status: Home / Self Care | Payer: Medicare HMO | Attending: Emergency Medicine | Admitting: Emergency Medicine

## 2019-11-02 ENCOUNTER — Emergency Department: Payer: Medicare HMO

## 2019-11-02 DIAGNOSIS — D489 Neoplasm of uncertain behavior, unspecified: Secondary | ICD-10-CM | POA: Insufficient documentation

## 2019-11-02 DIAGNOSIS — Z794 Long term (current) use of insulin: Secondary | ICD-10-CM | POA: Insufficient documentation

## 2019-11-02 DIAGNOSIS — J45909 Unspecified asthma, uncomplicated: Secondary | ICD-10-CM | POA: Insufficient documentation

## 2019-11-02 DIAGNOSIS — E860 Dehydration: Secondary | ICD-10-CM | POA: Insufficient documentation

## 2019-11-02 DIAGNOSIS — I130 Hypertensive heart and chronic kidney disease with heart failure and stage 1 through stage 4 chronic kidney disease, or unspecified chronic kidney disease: Secondary | ICD-10-CM | POA: Insufficient documentation

## 2019-11-02 DIAGNOSIS — E1122 Type 2 diabetes mellitus with diabetic chronic kidney disease: Secondary | ICD-10-CM | POA: Diagnosis not present

## 2019-11-02 DIAGNOSIS — R296 Repeated falls: Secondary | ICD-10-CM | POA: Insufficient documentation

## 2019-11-02 DIAGNOSIS — R05 Cough: Secondary | ICD-10-CM | POA: Insufficient documentation

## 2019-11-02 DIAGNOSIS — N183 Chronic kidney disease, stage 3 unspecified: Secondary | ICD-10-CM | POA: Insufficient documentation

## 2019-11-02 DIAGNOSIS — R531 Weakness: Secondary | ICD-10-CM | POA: Diagnosis present

## 2019-11-02 DIAGNOSIS — I2510 Atherosclerotic heart disease of native coronary artery without angina pectoris: Secondary | ICD-10-CM | POA: Diagnosis not present

## 2019-11-02 DIAGNOSIS — E039 Hypothyroidism, unspecified: Secondary | ICD-10-CM | POA: Insufficient documentation

## 2019-11-02 DIAGNOSIS — Z79899 Other long term (current) drug therapy: Secondary | ICD-10-CM | POA: Diagnosis not present

## 2019-11-02 DIAGNOSIS — I509 Heart failure, unspecified: Secondary | ICD-10-CM | POA: Diagnosis not present

## 2019-11-02 DIAGNOSIS — R5383 Other fatigue: Secondary | ICD-10-CM | POA: Insufficient documentation

## 2019-11-02 HISTORY — PX: WRIST SURGERY: SHX841

## 2019-11-02 LAB — CBC
HCT: 38.8 % (ref 36.0–46.0)
Hemoglobin: 12.7 g/dL (ref 12.0–15.0)
MCH: 29 pg (ref 26.0–34.0)
MCHC: 32.7 g/dL (ref 30.0–36.0)
MCV: 88.6 fL (ref 80.0–100.0)
Platelets: 312 10*3/uL (ref 150–400)
RBC: 4.38 MIL/uL (ref 3.87–5.11)
RDW: 14 % (ref 11.5–15.5)
WBC: 9 10*3/uL (ref 4.0–10.5)
nRBC: 0 % (ref 0.0–0.2)

## 2019-11-02 LAB — COMPREHENSIVE METABOLIC PANEL
ALT: 34 U/L (ref 0–44)
AST: 43 U/L — ABNORMAL HIGH (ref 15–41)
Albumin: 3.7 g/dL (ref 3.5–5.0)
Alkaline Phosphatase: 43 U/L (ref 38–126)
Anion gap: 12 (ref 5–15)
BUN: 47 mg/dL — ABNORMAL HIGH (ref 6–20)
CO2: 26 mmol/L (ref 22–32)
Calcium: 9.1 mg/dL (ref 8.9–10.3)
Chloride: 96 mmol/L — ABNORMAL LOW (ref 98–111)
Creatinine, Ser: 1.77 mg/dL — ABNORMAL HIGH (ref 0.44–1.00)
GFR calc Af Amer: 37 mL/min — ABNORMAL LOW (ref >60)
GFR calc non Af Amer: 32 mL/min — ABNORMAL LOW (ref >60)
Glucose, Bld: 101 mg/dL — ABNORMAL HIGH (ref 70–99)
Potassium: 4.4 mmol/L (ref 3.5–5.1)
Sodium: 134 mmol/L — ABNORMAL LOW (ref 135–145)
Total Bilirubin: 0.5 mg/dL (ref 0.3–1.2)
Total Protein: 6.3 g/dL — ABNORMAL LOW (ref 6.5–8.1)

## 2019-11-02 LAB — URINALYSIS, COMPLETE (UACMP) WITH MICROSCOPIC
Bacteria, UA: NONE SEEN
Bilirubin Urine: NEGATIVE
Glucose, UA: 150 mg/dL — AB
Hgb urine dipstick: NEGATIVE
Ketones, ur: NEGATIVE mg/dL
Leukocytes,Ua: NEGATIVE
Nitrite: NEGATIVE
Protein, ur: NEGATIVE mg/dL
Specific Gravity, Urine: 1.015 (ref 1.005–1.030)
pH: 5 (ref 5.0–8.0)

## 2019-11-02 LAB — LIPASE, BLOOD: Lipase: 29 U/L (ref 11–51)

## 2019-11-02 MED ORDER — SODIUM CHLORIDE 0.9 % IV BOLUS
1000.0000 mL | Freq: Once | INTRAVENOUS | Status: AC
  Administered 2019-11-02: 1000 mL via INTRAVENOUS

## 2019-11-02 NOTE — ED Provider Notes (Signed)
[facility] Emergency Department Provider Note  Time seen: 11:12 AM  I have reviewed the triage vital signs and the nursing notes.   HISTORY  Chief Complaint Weakness and Abdominal Pain   HPI [name] is a 54 y.o. female with a past medical history of anxiety, bipolar, CHF, CKD, COPD, depression, reflux, hypertension, hyperlipidemia, obesity, presents to the emergency department for multiple complaints.  Patient states over the past several weeks she has been falling more often than normal.  States over the past several days she has been feeling weak.  Patient states  fatigue with mild cough.  Denies shortness of breath.  Denies chest pain.  Denies abdominal pain nausea vomiting or diarrhea.  No dysuria.  Patient believes she could be dehydrated.  Past Medical History:  Diagnosis Date  . Anxiety   . Arthritis   . Asthma   . Bipolar depression ([place])   . CHF (congestive heart failure) ([place])   . Chronic headaches    migraines have lessened  . Chronic kidney disease    ddstage 111  . COPD (chronic obstructive pulmonary disease) ([place])   . Coronary artery disease   . Depression   . Diabetic neuropathy ([place])   . Dyspnea    uses inhalers for this  . Dysrhythmia    rapid  . Fatty liver   . GERD (gastroesophageal reflux disease)   . Headache   . Heart attack ([place]) 2009  . Hyperlipidemia   . Hypertension   . Hypomagnesemia   . Hypothyroid   . Irregular heart rhythm   . Liver lesion    Favored to be benign on CT; MRI of abd w/contract in Aug 2016. Liver Biopsy-Negative, March 2016  . Morbid obesity ([place])   . OSA (obstructive sleep apnea)   . Peripheral vascular disease ([place])   . Pinched nerve    Back  . Pneumonia   . RLS (restless legs syndrome)   . Seasonal allergies   . Sleep apnea    uses oxygen at night 2L d/t cpap did not fit  . Type 2 diabetes, uncontrolled, with neuropathy ([place]) 10/15/2015   Overview:  Microalbumin 7.7 12/2010: Hgb A1c  10.2% 12/2010 and 9.7% 03/2011, eye exam 10/2009 [facility] DM clinic   . Urinary incontinence   . Vitamin D deficiency     Patient Active Problem List   Diagnosis Date Noted  . Abscess of axilla, right 04/16/2019  . Iron deficiency anemia due to chronic blood loss   . Polyp of ascending colon   . Chronic gastric ulcer with hemorrhage   . Type 2 diabetes with decreased circulation ([place]) 11/07/2018  . Vitamin B12 deficiency 08/08/2018  . Encounter for Medicare annual wellness exam 10/30/2017  . Patient is full code 10/30/2017  . Disorder of bursae of shoulder region 10/09/2017  . Coronary artery disease 10/09/2017  . Plantar fascial fibromatosis 10/09/2017  . Tendinitis of wrist 10/09/2017  . Schizoaffective disorder, bipolar type ([place]) 09/28/2017  . Chest pain 09/10/2017  . Pleuritic chest pain 05/27/2017  . Hypertriglyceridemia 12/21/2016  . Pansinusitis 11/15/2016  . 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 Breast cancer screening 09/28/2016  . Urine frequency 09/28/2016  . Preventative health care 09/28/2016  . Fatigue 05/01/2016  . Urinary tract infection 03/14/2016  . Adenomatous colon polyp 10/15/2015  . Airway hyperreactivity 10/15/2015  . Bipolar affective disorder ([place]) 10/15/2015  . CN (constipation) 10/15/2015  . Drug-induced hepatic toxicity 10/15/2015  .  Blood in feces 10/15/2015  . HLD (hyperlipidemia) 10/15/2015  . Hypoxemia 10/15/2015  . NASH (nonalcoholic steatohepatitis) 10/15/2015  . Morbid obesity ([place]) 10/15/2015  . Restless leg syndrome 10/15/2015  . Type 2 diabetes, uncontrolled, with neuropathy ([place]) 10/15/2015  . Elevated alkaline phosphatase level 09/22/2015  . Urinary bladder incontinence 09/22/2015  . Low back pain with left-sided sciatica 08/18/2015  . Encounter for monitoring tricyclic antidepressant therapy 05/13/2015  . Bipolar disorder ([place]) 05/11/2015  . Apnea, sleep 05/05/2015  . Heart valve disease 05/05/2015  . Chronic  kidney disease, stage III (moderate) 03/15/2015  . Migraine 03/12/2015  . COPD (chronic obstructive pulmonary disease) ([place]) 01/20/2015  . 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[place]) 01/20/2015  . GERD (gastroesophageal reflux disease) 01/20/2015  . Hypothyroid   . Vitamin D deficiency   . Urinary incontinence   . Liver lesion   . Hypomagnesemia   . Obstructive sleep apnea 10/20/2013  . Vaginitis 12/19/2011  . PERIPHERAL NEUROPATHY 03/04/2009  . HEMORRHOIDS, EXTERNAL 09/03/2008  . ASTHMA, PERSISTENT, MODERATE 07/04/2007  . INTERSTITIAL CYSTITIS 07/04/2007  . HYPERTENSION, BENIGN ESSENTIAL 06/19/2007    Past Surgical History:  Procedure Laterality Date  . ABDOMINAL HYSTERECTOMY    . ABDOMINAL HYSTERECTOMY     Partial  . bladder stim  2007  . BLADDER SURGERY     x 2. tacking of bladder and the other was the stimulator placement  . CHOLECYSTECTOMY    . COLONOSCOPY    . COLONOSCOPY WITH PROPOFOL N/A 03/04/2019   Procedure: COLONOSCOPY WITH PROPOFOL;  Surgeon: [name], MD;  Location: [facility] ENDOSCOPY;  Service: Endoscopy;  Laterality: N/A;  . ESOPHAGOGASTRODUODENOSCOPY (EGD) WITH PROPOFOL N/A 03/04/2019   Procedure: ESOPHAGOGASTRODUODENOSCOPY (EGD) WITH PROPOFOL;  Surgeon: [name], MD;  Location: [facility] ENDOSCOPY;  Service: Endoscopy;  Laterality: N/A;  . INCISION AND DRAINAGE ABSCESS Right 04/16/2019   Procedure: INCISION AND DRAINAGE ABSCESS;  Surgeon: Benjamine Sprague, DO;  Location: [facility] ORS;  Service: General;  Laterality: Right;  . INTERSTIM-GENERATOR CHANGE N/A 12/31/2017   Procedure: INTERSTIM-GENERATOR CHANGE;  Surgeon: Bjorn Loser, MD;  Location: [facility] ORS;  Service: Urology;  Laterality: N/A;  . LIVER BIOPSY  March 2016   Negative  . LUNG BIOPSY  2015   small spot on lung  . TUBAL LIGATION    . UPPER GI ENDOSCOPY      Prior to Admission medications   Medication Sig Start Date End Date Taking? Authorizing Provider  albuterol (VENTOLIN HFA) 108 (90 Base) MCG/ACT inhaler  Inhale 2 puffs into the lungs every 6 (six) hours as needed for wheezing or shortness of breath. 08/12/19   Hubbard Hartshorn, FNP  atorvastatin (LIPITOR) 80 MG tablet Take 80 mg by mouth at bedtime.     [provider]  busPIRone (BUSPAR) 10 MG tablet Take 10 mg by mouth 2 (two) times daily.    [provider]  DULoxetine (CYMBALTA) 60 MG capsule Take 120 mg by mouth daily.    [provider]  empagliflozin (JARDIANCE) 25 MG TABS tablet Take 25 mg by mouth daily.    [provider]  ezetimibe (ZETIA) 10 MG tablet Take 10 mg by mouth daily.  01/05/17   [provider]  fenofibrate micronized (LOFIBRA) 134 MG capsule Take 134 mg by mouth daily.    [provider]  gabapentin (NEURONTIN) 300 MG capsule Take 1 tablet each morning, 1 tablet each afternoon, and 2 tablets at bedtime. 08/12/19   Hubbard Hartshorn, FNP  HUMULIN R U-500 KWIKPEN 500  UNIT/ML kwikpen Inject 70 Units as directed 3 (three) times daily.     [provider]  insulin aspart (NOVOLOG) 100 UNIT/ML injection Inject 5-25 Units into the skin 3 (three) times daily before meals.     [provider]  lamoTRIgine (LAMICTAL) 200 MG tablet Take 200 mg by mouth 2 (two) times daily.     [provider]  levothyroxine (SYNTHROID) 50 MCG tablet 1 TAB daily three days a week,  1.5 TABS four days a week (Mon, Wed, Fri, Sat for example) 09/04/19   Hubbard Hartshorn, FNP  lisinopril (ZESTRIL) 2.5 MG tablet TAKE 1 TABLET BY MOUTH EVERY DAY 07/08/19   Hubbard Hartshorn, FNP  metoprolol tartrate (LOPRESSOR) 25 MG tablet TAKE 1 TABLET (25MG TOTAL) IN THE MORNING AND 1/2 TABLET IN THE EVENING 08/19/19   Sowles, Drue [place], MD  Omega-3 Fatty Acids (FISH OIL PO) Take by mouth daily.    [provider]  ondansetron (ZOFRAN) 4 MG tablet TAKE 1 TABLET BY MOUTH EVERY 8 HOURS AS NEEDED FOR NAUSEA OR VOMITING 09/15/19   [name], MD  [facility] VERIO test strip  08/18/19   [provider]  pantoprazole (PROTONIX) 40 MG tablet Take 1 tablet (40 mg total) by mouth daily. 08/20/19   [name], MD  polyethylene glycol (MIRALAX) 17 g packet Take 17 g by mouth daily. 10/27/19   Nance Pear, MD  QUEtiapine (SEROQUEL) 400 MG tablet Take 400 mg by mouth at bedtime.    [provider]  traMADol (ULTRAM) 50 MG tablet Take 50 mg by mouth every 6 (six) hours as needed. 08/12/19   [provider]  traZODone (DESYREL) 100 MG tablet Take 100 mg by mouth at bedtime.    [provider]  TRULICITY 1.5 XN/2.3FT SOPN Inject 1.5 mLs as directed every Sunday.  03/26/18   [provider]  ULTICARE SHORT PEN NEEDLES 31G X 8 MM [place]  09/10/19   [provider]    Allergies  Allergen Reactions  . Mirtazapine Anaphylaxis    REACTION: closes throat  . Morphine And Related     anaphylaxis  . Sulfamethoxazole-Trimethoprim Rash    REACTION: [place] all over  . Amoxicillin Er Nausea And Vomiting    Has patient had a PCN reaction causing immediate rash, facial/tongue/throat swelling, SOB or lightheadedness with hypotension: No Has patient had a PCN reaction causing severe rash involving mucus membranes or skin necrosis: No Has patient had a PCN reaction that required hospitalization: No Has patient had a PCN reaction occurring within the last 10 years: Yes If all of the above answers are "NO", then may proceed with Cephalosporin use.  . Codeine Itching    REACTION: itch, rash  . Hydrocodone-Acetaminophen Rash    REACTION: rash  . Keflex [Cephalexin] Rash  . Prasterone Nausea And Vomiting    Family History  Problem Relation Age of Onset  . Diabetes Mother   . Heart disease Mother   . Hyperlipidemia Mother   . Hypertension Mother   . Kidney disease Mother   . Mental illness Mother   . Hypothyroidism Mother   . Stroke Mother   . Osteoporosis Mother   . Glaucoma Mother   . Congestive Heart Failure Mother   . Hypertension Father   . Asthma  Daughter   . Cancer Daughter        throat  . Bipolar disorder Daughter   . Heart disease Maternal Grandfather   . Rheum arthritis Maternal Grandfather   .  Cancer Maternal Grandfather        liver  . Kidney disease Maternal Grandfather   . Cancer Paternal Grandmother        lung  . Kidney disease Brother   . Breast cancer Maternal Grandmother 60  . Bipolar disorder Daughter   . Hypertension Daughter   . Restless legs syndrome Daughter     Social History Social History   Tobacco Use  . Smoking status: Never Smoker  . Smokeless tobacco: Never Used  Substance Use Topics  . Alcohol use: No  . Drug use: No    Review of Systems Constitutional: Negative for fever.  Positive for generalized weakness. Cardiovascular: Negative for chest pain. Respiratory: Negative for shortness of breath. Gastrointestinal: Negative for abdominal pain, vomiting and diarrhea. Genitourinary: Negative for urinary compaints Musculoskeletal: Negative for musculoskeletal complaints Neurological: Negative for headache All other ROS negative  ____________________________________________   PHYSICAL EXAM:  VITAL SIGNS: ED Triage Vitals  Enc Vitals Group     BP 11/02/19 0939 112/86     Pulse Rate 11/02/19 0939 78     Resp 11/02/19 0939 18     Temp 11/02/19 0939 99.5 F (37.5 C)     Temp Source 11/02/19 0939 Oral     SpO2 11/02/19 0939 95 %     Weight 11/02/19 0940 198 lb 6.6 oz (90 kg)     Height 11/02/19 0940 4' 11"  (1.499 m)     Head Circumference --      Peak Flow --      Pain Score 11/02/19 0940 6     Pain Loc --      Pain Edu? --      Excl. in [place]? --     Constitutional: Alert and oriented. Well appearing and in no distress. Eyes: Normal exam ENT      Head: Normocephalic and atraumatic.      Mouth/Throat: Mucous membranes are moist. Cardiovascular: Normal rate, regular rhythm.  Respiratory: Normal respiratory effort without tachypnea nor retractions. Breath sounds are clear   Gastrointestinal: Soft and nontender. No distention. Musculoskeletal: Nontender with normal range of motion in all extremities.  Neurologic:  Normal speech and language. No gross focal neurologic deficits  Skin:  Skin is warm, dry and intact.  Psychiatric: Mood and affect are normal.   ____________________________________________    EKG  EKG viewed and interpreted by myself shows a normal sinus rhythm at 77 bpm, narrow QRS, normal axis, largely normal intervals with no concerning ST changes  ____________________________________________    RADIOLOGY  CT head negative. Chest x-ray negative.  ____________________________________________   INITIAL IMPRESSION / ASSESSMENT AND PLAN / ED COURSE  Pertinent labs & imaging results that were available during my care of the patient were reviewed by me and considered in my medical decision making (see chart for details).   Patient presents emergency department for generalized fatigue weakness multiple falls per patient.  Patient does have a low-grade temperature 99.5 and states a slight cough.  We will obtain a chest x-ray and Covid swab.  Lab work shows mild renal insufficiency consistent with dehydration we will IV hydrate obtain a urinalysis sample.  Given the patient's multiple falls does not know if she has been hitting her head per patient we will obtain CT imaging of the head as a precaution.  Overall patient appears well, no acute distress.  No concerning findings on physical exam.  Patient is work-up is essentially normal besides mild dehydration.  Imaging studies are negative.  Patient is feeling better after IV fluids.  Urinalysis has resulted normal as well.  We will discharge patient with PCP follow-up.  TYISHA CRESSY was evaluated in Emergency Department on 11/02/2019 for the symptoms described in the history of present illness. She was evaluated in the context of the global COVID-19 pandemic, which necessitated consideration that  the patient might be at risk for infection with the SARS-CoV-2 virus that causes COVID-19. Institutional protocols and algorithms that pertain to the evaluation of patients at risk for COVID-19 are in a state of rapid change based on information released by regulatory bodies including the CDC and federal and state organizations. These policies and algorithms were followed during the patient's care in the ED.  ____________________________________________   FINAL CLINICAL IMPRESSION(S) / ED DIAGNOSES  Weakness   Harvest Dark, MD 11/02/19 [phone]

## 2019-11-02 NOTE — ED Notes (Signed)
Pt ambulatory without assistance to the bathroom and pt able to give urine sample.

## 2019-11-02 NOTE — ED Notes (Signed)
Lab called. States not enough urine for urinalysis at this time.

## 2019-11-02 NOTE — ED Triage Notes (Signed)
Brought to ER via [facility] c/o weakness and fatigue beginning this AM at 10AM. VSS with EMS.

## 2019-11-02 NOTE — ED Notes (Signed)
Informed pt that we needed a urine sample, informed if pt was unable to give a sample we would have to in and out cath her per Dr. Kerman Passey, MD

## 2019-11-05 ENCOUNTER — Other Ambulatory Visit: Payer: Self-pay

## 2019-11-05 ENCOUNTER — Encounter: Payer: Self-pay | Admitting: Internal Medicine

## 2019-11-05 ENCOUNTER — Ambulatory Visit (INDEPENDENT_AMBULATORY_CARE_PROVIDER_SITE_OTHER): Payer: Medicare HMO | Admitting: Internal Medicine

## 2019-11-05 VITALS — Ht 59.0 in | Wt 198.0 lb

## 2019-11-05 DIAGNOSIS — I10 Essential (primary) hypertension: Secondary | ICD-10-CM

## 2019-11-05 DIAGNOSIS — R42 Dizziness and giddiness: Secondary | ICD-10-CM | POA: Diagnosis not present

## 2019-11-05 DIAGNOSIS — E1151 Type 2 diabetes mellitus with diabetic peripheral angiopathy without gangrene: Secondary | ICD-10-CM | POA: Diagnosis not present

## 2019-11-05 DIAGNOSIS — R2689 Other abnormalities of gait and mobility: Secondary | ICD-10-CM

## 2019-11-05 DIAGNOSIS — J449 Chronic obstructive pulmonary disease, unspecified: Secondary | ICD-10-CM

## 2019-11-05 MED ORDER — MECLIZINE HCL 12.5 MG PO TABS: 12.5000 mg | ORAL_TABLET | Freq: Three times a day (TID) | ORAL | 1 refills | Status: DC | PRN

## 2019-11-05 NOTE — Progress Notes (Signed)
Name: [name]   MRN: [id number]    DOB: 1966/07/13   Date:11/05/2019       Progress Note  Subjective  Chief Complaint  Chief Complaint  Patient presents with  . Dizziness    possible Vertigo, blanace off, off and on 6 months now    I connected with  [name] on 11/05/19 at  9:40 AM EST by telephone and verified that I am speaking with the correct person using two identifiers.  I discussed the limitations, risks, security and privacy concerns of performing an evaluation and management service by telephone and the availability of in person appointments. Staff also discussed with the patient that there may be a patient responsible charge related to this service. Patient Location: Home Provider Location: [facility] Additional Individuals present: none  HPI  Patient is a 54 year old female with a significant past medical history including a history of hypertension, diabetes with circulation and neuropathy comp's, hyperlipidemia, obesity, GERD, anxiety and depression, COPD, CKD, who was recently seen in the emergency room on 11/02/2019 for weakness and concerns for balance issues.  She did have a low-grade temperature at 99.5.  Her labs showed renal insufficiency with concerns for dehydration and she was given IV fluids, a chest x-ray and a CT scan of the head were obtained with the imaging studies noted to be negative.  Other labs including urinalysis were unremarkable, and she was discharged from the ER.  Patient notes in the past months episodes of dizziness, stumbles at times and loses her balance, fell 5 times since first of the year. When occurs, episodes happen intermittently for about several days and then goes away for days. Notes is room spinning some times, not always, also feeling lightheaded and very weak when happens Not worsen with head movements, is increased when gets up from sitting often.  Sometimes when just up and about will suddenly get dizzy and lightheaded No  nausea, no vomiting No recent URI sx's, no fever, no PND/congestion, had negative Covid test twice, last one 11/02/19 No unilateral hearing loss No tinnitus No passing out episodes,  no CP, palpitations, SOB increase in the recent past No one sided symptoms of concern No h/o of this in past  Sees endocrine for DM, [facility], Dr. Dareen Piano, sugars have been high, saw endocrine last Friday by her report and blood work done then was all ok by her reports (not in [place]). Checks sugars when feeling dizzy, and ok, not real high or low, also checks her BP when having sx's and ok. (about 110-120/70). Tries to stay hydrated.    Never smoker No alcohol use   Patient Active Problem List   Diagnosis Date Noted  . Abscess of axilla, right 04/16/2019  . Iron deficiency anemia due to chronic blood loss   . Polyp of ascending colon   . Chronic gastric ulcer with hemorrhage   . Type 2 diabetes with decreased circulation ([place]) 11/07/2018  . Vitamin B12 deficiency 08/08/2018  . Encounter for Medicare annual wellness exam 10/30/2017  . Patient is full code 10/30/2017  . Disorder of bursae of shoulder region 10/09/2017  . Coronary artery disease 10/09/2017  . Plantar fascial fibromatosis 10/09/2017  . Tendinitis of wrist 10/09/2017  . Schizoaffective disorder, bipolar type ([place]) 09/28/2017  . Chest pain 09/10/2017  . Pleuritic chest pain 05/27/2017  . Hypertriglyceridemia 12/21/2016  . Pansinusitis 11/15/2016  . 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 Breast cancer screening 09/28/2016  .  Urine frequency 09/28/2016  . Preventative health care 09/28/2016  . Fatigue 05/01/2016  . Urinary tract infection 03/14/2016  . Adenomatous colon polyp 10/15/2015  . Airway hyperreactivity 10/15/2015  . Bipolar affective disorder ([place]) 10/15/2015  . CN (constipation) 10/15/2015  . Drug-induced hepatic toxicity 10/15/2015  . Blood in feces 10/15/2015  . HLD (hyperlipidemia) 10/15/2015  .  Hypoxemia 10/15/2015  . NASH (nonalcoholic steatohepatitis) 10/15/2015  . Morbid obesity ([place]) 10/15/2015  . Restless leg syndrome 10/15/2015  . Type 2 diabetes, uncontrolled, with neuropathy ([place]) 10/15/2015  . Elevated alkaline phosphatase level 09/22/2015  . Urinary bladder incontinence 09/22/2015  . Low back pain with left-sided sciatica 08/18/2015  . Encounter for monitoring tricyclic antidepressant therapy 05/13/2015  . Bipolar disorder ([place]) 05/11/2015  . Apnea, sleep 05/05/2015  . Heart valve disease 05/05/2015  . Chronic kidney disease, stage III (moderate) 03/15/2015  . Migraine 03/12/2015  . COPD (chronic obstructive pulmonary disease) ([place]) 01/20/2015  . 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[place]) 01/20/2015  . GERD (gastroesophageal reflux disease) 01/20/2015  . Hypothyroid   . Vitamin D deficiency   . Urinary incontinence   . Liver lesion   . Hypomagnesemia   . Obstructive sleep apnea 10/20/2013  . Vaginitis 12/19/2011  . PERIPHERAL NEUROPATHY 03/04/2009  . HEMORRHOIDS, EXTERNAL 09/03/2008  . ASTHMA, PERSISTENT, MODERATE 07/04/2007  . INTERSTITIAL CYSTITIS 07/04/2007  . HYPERTENSION, BENIGN ESSENTIAL 06/19/2007    Past Surgical History:  Procedure Laterality Date  . ABDOMINAL HYSTERECTOMY    . ABDOMINAL HYSTERECTOMY     Partial  . bladder stim  2007  . BLADDER SURGERY     x 2. tacking of bladder and the other was the stimulator placement  . CHOLECYSTECTOMY    . COLONOSCOPY    . COLONOSCOPY WITH PROPOFOL N/A 03/04/2019   Procedure: COLONOSCOPY WITH PROPOFOL;  Surgeon: [name], MD;  Location: [facility] ENDOSCOPY;  Service: Endoscopy;  Laterality: N/A;  . ESOPHAGOGASTRODUODENOSCOPY (EGD) WITH PROPOFOL N/A 03/04/2019   Procedure: ESOPHAGOGASTRODUODENOSCOPY (EGD) WITH PROPOFOL;  Surgeon: [name], MD;  Location: [facility] ENDOSCOPY;  Service: Endoscopy;  Laterality: N/A;  . INCISION AND DRAINAGE ABSCESS Right 04/16/2019   Procedure: INCISION AND DRAINAGE ABSCESS;  Surgeon:  Benjamine Sprague, DO;  Location: [facility] ORS;  Service: General;  Laterality: Right;  . INTERSTIM-GENERATOR CHANGE N/A 12/31/2017   Procedure: INTERSTIM-GENERATOR CHANGE;  Surgeon: Bjorn Loser, MD;  Location: [facility] ORS;  Service: Urology;  Laterality: N/A;  . LIVER BIOPSY  March 2016   Negative  . LUNG BIOPSY  2015   small spot on lung  . TUBAL LIGATION    . UPPER GI ENDOSCOPY      Family History  Problem Relation Age of Onset  . Diabetes Mother   . Heart disease Mother   . Hyperlipidemia Mother   . Hypertension Mother   . Kidney disease Mother   . Mental illness Mother   . Hypothyroidism Mother   . Stroke Mother   . Osteoporosis Mother   . Glaucoma Mother   . Congestive Heart Failure Mother   . Hypertension Father   . Asthma Daughter   . Cancer Daughter        throat  . Bipolar disorder Daughter   . Heart disease Maternal Grandfather   . Rheum arthritis Maternal Grandfather   . Cancer Maternal Grandfather        liver  . Kidney disease Maternal Grandfather   . Cancer Paternal Grandmother        lung  . Kidney disease Brother   .  Breast cancer Maternal Grandmother 60  . Bipolar disorder Daughter   . Hypertension Daughter   . Restless legs syndrome Daughter     Social History   Tobacco Use  . Smoking status: Never Smoker  . Smokeless tobacco: Never Used  Substance Use Topics  . Alcohol use: No     Current Outpatient Medications:  .  albuterol (VENTOLIN HFA) 108 (90 Base) MCG/ACT inhaler, Inhale 2 puffs into the lungs every 6 (six) hours as needed for wheezing or shortness of breath., Disp: 18 g, Rfl: 3 .  atorvastatin (LIPITOR) 80 MG tablet, Take 80 mg by mouth at bedtime. , Disp: , Rfl:  .  busPIRone (BUSPAR) 10 MG tablet, Take 20 mg by mouth 2 (two) times daily. , Disp: , Rfl:  .  cephALEXin (KEFLEX) 500 MG capsule, Take 500 mg by mouth 4 (four) times daily., Disp: , Rfl:  .  DULoxetine (CYMBALTA) 60 MG capsule, Take 120 mg by mouth daily., Disp: , Rfl:  .   empagliflozin (JARDIANCE) 25 MG TABS tablet, Take 25 mg by mouth daily., Disp: , Rfl:  .  ezetimibe (ZETIA) 10 MG tablet, Take 10 mg by mouth daily. , Disp: , Rfl:  .  fenofibrate micronized (LOFIBRA) 134 MG capsule, Take 134 mg by mouth daily., Disp: , Rfl:  .  gabapentin (NEURONTIN) 300 MG capsule, Take 1 tablet each morning, 1 tablet each afternoon, and 2 tablets at bedtime. (Patient taking differently: Take 300-600 mg by mouth 3 (three) times daily. Take 1 capsule each morning, 1 capsule each afternoon, and 2 capsules at bedtime.), Disp: 360 capsule, Rfl: 1 .  HUMULIN R U-500 KWIKPEN 500 UNIT/ML kwikpen, Inject 70 Units as directed 3 (three) times daily. , Disp: , Rfl:  .  insulin aspart (NOVOLOG) 100 UNIT/ML injection, Inject 5-25 Units into the skin 3 (three) times daily before meals. , Disp: , Rfl:  .  lamoTRIgine (LAMICTAL) 200 MG tablet, Take 200 mg by mouth 2 (two) times daily. , Disp: , Rfl:  .  levothyroxine (SYNTHROID) 50 MCG tablet, 1 TAB daily three days a week,  1.5 TABS four days a week (Mon, Wed, Fri, Sat for example), Disp: 108 tablet, Rfl: 0 .  lisinopril (ZESTRIL) 2.5 MG tablet, TAKE 1 TABLET BY MOUTH EVERY DAY (Patient taking differently: Take 2.5 mg by mouth daily. ), Disp: 90 tablet, Rfl: 1 .  metoprolol tartrate (LOPRESSOR) 25 MG tablet, TAKE 1 TABLET (25MG TOTAL) IN THE MORNING AND 1/2 TABLET IN THE EVENING (Patient taking differently: Take 12.5-25 mg by mouth 2 (two) times daily. TAKE 1 TABLET (25MG TOTAL) IN THE MORNING AND 1/2 TABLET (12.5 mg) IN THE EVENING), Disp: 135 tablet, Rfl: 1 .  Omega-3 Fatty Acids (FISH OIL PO), Take by mouth daily., Disp: , Rfl:  .  ondansetron (ZOFRAN) 4 MG tablet, TAKE 1 TABLET BY MOUTH EVERY 8 HOURS AS NEEDED FOR NAUSEA OR VOMITING (Patient taking differently: Take 4 mg by mouth every 8 (eight) hours as needed for nausea or vomiting. ), Disp: 20 tablet, Rfl: 2 .  ONETOUCH VERIO test strip, , Disp: , Rfl:  .  pantoprazole (PROTONIX) 40 MG  tablet, Take 1 tablet (40 mg total) by mouth daily., Disp: 90 tablet, Rfl: 3 .  polyethylene glycol (MIRALAX) 17 g packet, Take 17 g by mouth daily. (Patient taking differently: Take 17 g by mouth daily as needed for mild constipation. ), Disp: 14 each, Rfl: 0 .  QUEtiapine (SEROQUEL) 400 MG tablet, Take 400  mg by mouth at bedtime., Disp: , Rfl:  .  traMADol (ULTRAM) 50 MG tablet, Take 50 mg by mouth every 6 (six) hours as needed., Disp: , Rfl:  .  traZODone (DESYREL) 100 MG tablet, Take 100 mg by mouth at bedtime., Disp: , Rfl:  .  TRULICITY 1.5 TJ/0.3ES SOPN, Inject 1.5 mLs as directed every Sunday. , Disp: , Rfl:  .  ULTICARE SHORT PEN NEEDLES 31G X 8 MM MISC, , Disp: , Rfl:   Allergies  Allergen Reactions  . Mirtazapine Anaphylaxis    REACTION: closes throat  . Morphine And Related     anaphylaxis  . Sulfamethoxazole-Trimethoprim Rash    REACTION: [place] all over  . Amoxicillin Er Nausea And Vomiting    Has patient had a PCN reaction causing immediate rash, facial/tongue/throat swelling, SOB or lightheadedness with hypotension: No Has patient had a PCN reaction causing severe rash involving mucus membranes or skin necrosis: No Has patient had a PCN reaction that required hospitalization: No Has patient had a PCN reaction occurring within the last 10 years: Yes If all of the above answers are "NO", then may proceed with Cephalosporin use.  . Codeine Itching    REACTION: itch, rash  . Hydrocodone-Acetaminophen Rash    REACTION: rash  . Keflex [Cephalexin] Rash  . Prasterone Nausea And Vomiting    With staff assistance, above reviewed with the patient today.  ROS: As per HPI, otherwise no specific complaints on a limited and focused system review   Objective  Virtual encounter, vitals not obtained.  Body mass index is 39.99 kg/m.  Physical Exam   Appears in NAD via conversation  No obvious respiratory distress. Speaking in complete sentences Neurological: Pt is alert,  Speech is normal Psychiatric: Patient has a normal mood and affect, behavior is normal. Judgment and thought content normal.  W/u from the ER visit reviewed Results for orders placed or performed during the hospital encounter of 11/02/19 (from the past 72 hour(s))  Lipase, blood     Status: None   Collection Time: 11/02/19  9:48 AM  Result Value Ref Range   Lipase 29 11 - 51 U/L    Comment: Performed at [facility], [address] [place], [place] [address]  Comprehensive metabolic panel     Status: Abnormal   Collection Time: 11/02/19  9:48 AM  Result Value Ref Range   Sodium 134 (L) 135 - 145 mmol/L   Potassium 4.4 3.5 - 5.1 mmol/L   Chloride 96 (L) 98 - 111 mmol/L   CO2 26 22 - 32 mmol/L   Glucose, Bld 101 (H) 70 - 99 mg/dL    Comment: Glucose reference range applies only to samples taken after fasting for at least 8 hours.   BUN 47 (H) 6 - 20 mg/dL   Creatinine, Ser 1.77 (H) 0.44 - 1.00 mg/dL   Calcium 9.1 8.9 - 10.3 mg/dL   Total Protein 6.3 (L) 6.5 - 8.1 g/dL   Albumin 3.7 3.5 - 5.0 g/dL   AST 43 (H) 15 - 41 U/L   ALT 34 0 - 44 U/L   Alkaline Phosphatase 43 38 - 126 U/L   Total Bilirubin 0.5 0.3 - 1.2 mg/dL   GFR calc non Af Amer 32 (L) >60 mL/min   GFR calc Af Amer 37 (L) >60 mL/min   Anion gap 12 5 - 15    Comment: Performed at [facility], [address] [place], [place] [address]  CBC  Status: None   Collection Time: 11/02/19  9:48 AM  Result Value Ref Range   WBC 9.0 4.0 - 10.5 K/uL   RBC 4.38 3.87 - 5.11 MIL/uL   Hemoglobin 12.7 12.0 - 15.0 g/dL   HCT 38.8 36.0 - 46.0 %   MCV 88.6 80.0 - 100.0 fL   MCH 29.0 26.0 - 34.0 pg   MCHC 32.7 30.0 - 36.0 g/dL   RDW 14.0 11.5 - 15.5 %   Platelets 312 150 - 400 K/uL   nRBC 0.0 0.0 - 0.2 %    Comment: Performed at [facility], [address] [place], [place] [address]  Urinalysis, Complete w Microscopic     Status: Abnormal   Collection Time: 11/02/19  2:18 PM  Result Value Ref  Range   Color, Urine YELLOW (A) YELLOW   APPearance CLEAR (A) CLEAR   Specific Gravity, Urine 1.015 1.005 - 1.030   pH 5.0 5.0 - 8.0   Glucose, UA 150 (A) NEGATIVE mg/dL   Hgb urine dipstick NEGATIVE NEGATIVE   Bilirubin Urine NEGATIVE NEGATIVE   Ketones, ur NEGATIVE NEGATIVE mg/dL   Protein, ur NEGATIVE NEGATIVE mg/dL   Nitrite NEGATIVE NEGATIVE   Leukocytes,Ua NEGATIVE NEGATIVE   RBC / HPF 0-5 0 - 5 RBC/hpf   WBC, UA 0-5 0 - 5 WBC/hpf   Bacteria, UA NONE SEEN NONE SEEN   Squamous Epithelial / LPF 0-5 0 - 5    Comment: Performed at [facility], [address] [place], [place] [address]    PHQ2/9: Depression screen [facility] 2/9 11/05/2019 09/25/2019 08/12/2019 04/09/2019 02/20/2019  Decreased Interest 0 0 0 0 0  Down, Depressed, Hopeless 0 0 0 0 0  PHQ - 2 Score 0 0 0 0 0  Altered sleeping 0 - 0 0 0  Tired, decreased energy 2 - 1 0 0  Change in appetite 0 - 1 0 0  Feeling bad or failure about yourself  0 - 0 0 0  Trouble concentrating 0 - 0 0 0  Moving slowly or fidgety/restless 0 - 0 0 0  Suicidal thoughts 0 - 0 0 0  PHQ-9 Score 2 - 2 0 0  Difficult doing work/chores Somewhat difficult - Somewhat difficult Not difficult at all Not difficult at all  Some recent data might be hidden   PHQ-2/9 Result reviewed and neg  Fall Risk: Fall Risk  11/05/2019 09/25/2019 08/12/2019 04/09/2019 02/20/2019  Falls in the past year? 1 0 0 0 1  Comment - - - - -  Number falls in past yr: 1 0 0 0 1  Comment - - - - -  Injury with Fall? 0 0 0 0 0  Comment - - - - -  Risk for fall due to : - No Fall Risks - - -  Follow up - Falls prevention discussed Falls evaluation completed - -     Assessment & Plan  1. Dizzy spells  Discussed at length the possibility of vertigo.  Does not seem to bother her with head movements, and she has not had a recent upper respiratory infection concern.  Encouraged has no unilateral hearing loss or tinnitus.  Am concerned with her balance difficulties, and  stumbling as she describes.  Did have a CT exam on her last ER visit which was negative.  She has checked her sugars and her blood pressures when she is having symptoms, and states they have been okay.  Has had recent laboratory evaluation, most  recently in the emergency room with some of the abnormalities felt secondary to her hydration and she was given IV fluids and felt better after.  Her blood count was normal.  Limited as this was a phone conversation, and noted that to her.  Ideally would be able to further assess with a clinical component.  When discussed options, it was agreed to get neurology's opinion and a consult ordered. Did discuss a medicine that sometimes helps with vertigo symptoms, Antivert, and will prescribe 12.5 mg up to 3 times a day as needed for dizziness.  Did note may make drowsy as well. Also emphasized staying well-hydrated. Also, when she first gets up from a laying down or seated position, she should not advance for a brief time and make sure she is stable before doing so to help prevent any falls. She does not currently drive, and recommended that she not drive presently. - Ambulatory referral to Neurology - meclizine (ANTIVERT) 12.5 MG tablet; Take 1 tablet (12.5 mg total) by mouth 3 (three) times daily as needed for dizziness.  Dispense: 30 tablet; Refill: 1  2. Balance problem As above - Ambulatory referral to Neurology  3. Type 2 diabetes with decreased circulation ([place]) Continue with endocrine input, also with her medication regimen and trying to keep blood sugars better control  4. HYPERTENSION, BENIGN ESSENTIAL Do not have a blood pressure check from the office as this was a phone conversation, although she checks them at home and notes they have been well controlled  5. Chronic obstructive pulmonary disease, unspecified COPD type ([place]) Has been stable, and she denied any increased breathing difficulties or shortness of breath in the recent past.   I  discussed the assessment and treatment plan with the patient. The patient was provided an opportunity to ask questions and all were answered. The patient agreed with the plan and demonstrated an understanding of the instructions.  Red flags and when to present for emergency care or RTC including increasing sx's or any  new/worsening/un-resolving symptoms reviewed with patient as await neuro input.   The patient was advised to call back or seek an in-person evaluation if the symptoms worsen or if the condition fails to improve as anticipated.  I provided 30 minutes of non-face-to-face time during this encounter that included discussing at length patient's sx/history, pertinent pmhx, medications, treatment and follow up plan. This time also included the necessary documentation, orders, and chart review.  Towanda Malkin, MD

## 2019-11-11 ENCOUNTER — Telehealth: Payer: Self-pay | Admitting: Cardiovascular Disease

## 2019-11-11 NOTE — Progress Notes (Signed)
Virtual Visit via Video Note   This visit type was conducted due to national recommendations for restrictions regarding the COVID-19 Pandemic (e.g. social distancing) in an effort to limit this patient's exposure and mitigate transmission in our community.  Due to her co-morbid illnesses, this patient is at least at moderate risk for complications without adequate follow up.  This format is felt to be most appropriate for this patient at this time.  All issues noted in this document were discussed and addressed.  A limited physical exam was performed with this format.  Please refer to the patient's chart for her consent to telehealth for [facility].   I connected with  [name] on 11/12/19 by a video enabled telemedicine application and verified that I am speaking with the correct person using two identifiers. I discussed the limitations of evaluation and management by telemedicine. The patient expressed understanding and agreed to proceed.   Evaluation Performed:  Follow-up visit  Date:  11/12/2019   ID:  Stacey, Yang Oct 01, 1965, MRN [id number]  Patient Location:  [address] [place] [address]   Provider location:   [facility], [place] office  PCP:  Hubbard Hartshorn, FNP  Cardiologist:  Arvid Right [facility]  Chief Complaint  Patient presents with  . other    Pt states having chest pain leading into Left shoulder and arm. dizziness, fatigue, SOB. Meds verbally reviewed w/ pt.     History of Present Illness:    [name] is a 54 y.o. female who presents via audio/video conferencing for a telehealth visit today.   The patient does not symptoms concerning for COVID-19 infection (fever, chills, cough, or new SHORTNESS OF BREATH).   past medical history of uncontrolled T2DM,  Hypothyroidism,  NASH,  Restless Leg Syndrome Chronic back pain Sinusitis depression Who presents for f/u of her  palpitations, chest pain  Seen Tired Having  some chest pain At rest, sometimes with exertion Stays in the house mostly,plays on computer generalized fatigue weakness multiple falls per patient.  endocrine for DM, [facility]  Heart racing On metoprolol  Husband helps groceries  CR 1.77 HBA1C 7.8, down from 123 in 2018 Labs check by Dr. Rhona Yang  ER 06/26/2017: chest pain burning central chest pain which is nonradiating, going on since this morning. Constant for the past 8-9 hours. Worse lying flat, better  sitting upright.  somewhat worsened by deep breathing. Not exertional. No fevers chills or cough. Mild to moderate intensity.  CT scan chest September 2018 reviewed with her in detail Unable to exclude mild coronary calcification mid LAD otherwise no aortic atherosclerosis    Prior CV studies:   The following studies were reviewed today:   Past Medical History:  Diagnosis Date  . Anxiety   . Arthritis   . Asthma   . Bipolar depression ([place])   . CHF (congestive heart failure) ([place])   . Chronic headaches    migraines have lessened  . Chronic kidney disease    ddstage 111  . COPD (chronic obstructive pulmonary disease) ([place])   . Coronary artery disease   . Depression   . Diabetic neuropathy ([place])   . Dyspnea    uses inhalers for this  . Dysrhythmia    rapid  . Fatty liver   . GERD (gastroesophageal reflux disease)   . Headache   . Heart attack ([place]) 2009  . Hyperlipidemia   . Hypertension   . Hypomagnesemia   .  Hypothyroid   . Irregular heart rhythm   . Liver lesion    Favored to be benign on CT; MRI of abd w/contract in Aug 2016. Liver Biopsy-Negative, March 2016  . Morbid obesity ([place])   . OSA (obstructive sleep apnea)   . Peripheral vascular disease ([place])   . Pinched nerve    Back  . Pneumonia   . RLS (restless legs syndrome)   . Seasonal allergies   . Sleep apnea    uses oxygen at night 2L d/t cpap did not fit  . Type 2 diabetes, uncontrolled, with neuropathy ([place]) 10/15/2015   Overview:   Microalbumin 7.7 12/2010: Hgb A1c 10.2% 12/2010 and 9.7% 03/2011, eye exam 10/2009 [facility] DM clinic   . Urinary incontinence   . Vitamin D deficiency    Past Surgical History:  Procedure Laterality Date  . ABDOMINAL HYSTERECTOMY    . ABDOMINAL HYSTERECTOMY     Partial  . bladder stim  2007  . BLADDER SURGERY     x 2. tacking of bladder and the other was the stimulator placement  . CHOLECYSTECTOMY    . COLONOSCOPY    . COLONOSCOPY WITH PROPOFOL N/A 03/04/2019   Procedure: COLONOSCOPY WITH PROPOFOL;  Surgeon: [name], MD;  Location: [facility] ENDOSCOPY;  Service: Endoscopy;  Laterality: N/A;  . ESOPHAGOGASTRODUODENOSCOPY (EGD) WITH PROPOFOL N/A 03/04/2019   Procedure: ESOPHAGOGASTRODUODENOSCOPY (EGD) WITH PROPOFOL;  Surgeon: [name], MD;  Location: [facility] ENDOSCOPY;  Service: Endoscopy;  Laterality: N/A;  . INCISION AND DRAINAGE ABSCESS Right 04/16/2019   Procedure: INCISION AND DRAINAGE ABSCESS;  Surgeon: Benjamine Sprague, DO;  Location: [facility] ORS;  Service: General;  Laterality: Right;  . INTERSTIM-GENERATOR CHANGE N/A 12/31/2017   Procedure: INTERSTIM-GENERATOR CHANGE;  Surgeon: Bjorn Loser, MD;  Location: [facility] ORS;  Service: Urology;  Laterality: N/A;  . LIVER BIOPSY  March 2016   Negative  . LUNG BIOPSY  2015   small spot on lung  . TUBAL LIGATION    . UPPER GI ENDOSCOPY    . WRIST SURGERY Right 11/02/2019      Allergies:   Mirtazapine, Morphine and related, Sulfamethoxazole-trimethoprim, Amoxicillin er, Codeine, Hydrocodone-acetaminophen, Keflex [cephalexin], and Prasterone   Social History   Tobacco Use  . Smoking status: Never Smoker  . Smokeless tobacco: Never Used  Substance Use Topics  . Alcohol use: No  . Drug use: No     Current Outpatient Medications on File Prior to Visit  Medication Sig Dispense Refill  . albuterol (VENTOLIN HFA) 108 (90 Base) MCG/ACT inhaler Inhale 2 puffs into the lungs every 6 (six) hours as needed for wheezing or shortness of breath. 18 g 3   . atorvastatin (LIPITOR) 80 MG tablet Take 80 mg by mouth at bedtime.     . busPIRone (BUSPAR) 10 MG tablet Take 20 mg by mouth 2 (two) times daily.     . DULoxetine (CYMBALTA) 60 MG capsule Take 120 mg by mouth daily.    . empagliflozin (JARDIANCE) 25 MG TABS tablet Take 25 mg by mouth daily.    Marland Kitchen ezetimibe (ZETIA) 10 MG tablet Take 10 mg by mouth daily.     . fenofibrate micronized (LOFIBRA) 134 MG capsule Take 134 mg by mouth daily.    Marland Kitchen gabapentin (NEURONTIN) 300 MG capsule Take 1 tablet each morning, 1 tablet each afternoon, and 2 tablets at bedtime. (Patient taking differently: Take 300-600 mg by mouth 3 (three) times daily. Take 1 capsule each morning, 1 capsule each afternoon, and 2 capsules at  bedtime.) 360 capsule 1  . HUMULIN R U-500 KWIKPEN 500 UNIT/ML kwikpen Inject 70 Units as directed 3 (three) times daily.     . insulin aspart (NOVOLOG) 100 UNIT/ML injection Inject 5-25 Units into the skin 3 (three) times daily before meals.     . lamoTRIgine (LAMICTAL) 200 MG tablet Take 200 mg by mouth 2 (two) times daily.     Marland Kitchen levothyroxine (SYNTHROID) 50 MCG tablet 1 TAB daily three days a week,  1.5 TABS four days a week (Mon, Wed, Fri, Sat for example) 108 tablet 0  . lisinopril (ZESTRIL) 2.5 MG tablet TAKE 1 TABLET BY MOUTH EVERY DAY (Patient taking differently: Take 2.5 mg by mouth daily. ) 90 tablet 1  . meclizine (ANTIVERT) 12.5 MG tablet Take 1 tablet (12.5 mg total) by mouth 3 (three) times daily as needed for dizziness. 30 tablet 1  . metoprolol tartrate (LOPRESSOR) 25 MG tablet TAKE 1 TABLET (25MG TOTAL) IN THE MORNING AND 1/2 TABLET IN THE EVENING (Patient taking differently: Take 12.5-25 mg by mouth 2 (two) times daily. TAKE 1 TABLET (25MG TOTAL) IN THE MORNING AND 1/2 TABLET (12.5 mg) IN THE EVENING) 135 tablet 1  . Omega-3 Fatty Acids (FISH OIL PO) Take by mouth daily.    . ondansetron (ZOFRAN) 4 MG tablet TAKE 1 TABLET BY MOUTH EVERY 8 HOURS AS NEEDED FOR NAUSEA OR VOMITING  (Patient taking differently: Take 4 mg by mouth every 8 (eight) hours as needed for nausea or vomiting. ) 20 tablet 2  . ONETOUCH VERIO test strip     . pantoprazole (PROTONIX) 40 MG tablet Take 1 tablet (40 mg total) by mouth daily. 90 tablet 3  . polyethylene glycol (MIRALAX) 17 g packet Take 17 g by mouth daily. (Patient taking differently: Take 17 g by mouth daily as needed for mild constipation. ) 14 each 0  . QUEtiapine (SEROQUEL) 400 MG tablet Take 400 mg by mouth at bedtime.    . traMADol (ULTRAM) 50 MG tablet Take 50 mg by mouth every 6 (six) hours as needed.    . traZODone (DESYREL) 100 MG tablet Take 100 mg by mouth at bedtime.    . TRULICITY 1.5 XB/2.8UX SOPN Inject 1.5 mLs as directed every Sunday.     Marland Kitchen ULTICARE SHORT PEN NEEDLES 31G X 8 MM MISC      No current facility-administered medications on file prior to visit.     Family Hx: [name] family history includes Asthma in her daughter; Bipolar disorder in her daughter and daughter; Breast cancer (age of onset: 59) in her maternal grandmother; Cancer in her daughter, maternal grandfather, and paternal grandmother; Congestive Heart Failure in her mother; Diabetes in her mother; Glaucoma in her mother; Heart disease in her maternal grandfather and mother; Hyperlipidemia in her mother; Hypertension in her daughter, father, and mother; Hypothyroidism in her mother; Kidney disease in her brother, maternal grandfather, and mother; Mental illness in her mother; Osteoporosis in her mother; Restless legs syndrome in her daughter; Rheum arthritis in her maternal grandfather; Stroke in her mother.  ROS:   Please see the history of present illness.    Review of Systems  Constitutional: Negative.   HENT: Negative.   Respiratory: Negative.   Cardiovascular: Positive for chest pain.  Gastrointestinal: Negative.   Musculoskeletal: Negative.        Leg weakness  Neurological: Negative.   Psychiatric/Behavioral: Negative.   All other  systems reviewed and are negative.     Labs/Other Tests  and Data Reviewed:    Recent Labs: 02/24/2019: TSH 1.17 11/02/2019: ALT 34; BUN 47; Creatinine, Ser 1.77; Hemoglobin 12.7; Platelets 312; Potassium 4.4; Sodium 134   Recent Lipid Panel Lab Results  Component Value Date/Time   CHOL 196 09/28/2016 02:56 PM   CHOL 196 09/22/2015 01:39 PM   CHOL 141 10/25/2013 03:14 AM   TRIG 856 (H) 09/28/2016 02:56 PM   TRIG 830 (H) 10/25/2013 03:14 AM   HDL 33 (L) 09/28/2016 02:56 PM   HDL 35 (L) 09/22/2015 01:39 PM   HDL 9 (L) 10/25/2013 03:14 AM   CHOLHDL 5.9 (H) 09/28/2016 02:56 PM   LDLCALC NOT CALC 09/28/2016 02:56 PM   LDLCALC 99 09/22/2015 01:39 PM   LDLCALC SEE COMMENT 10/25/2013 03:14 AM   LDLDIRECT 65.4 06/08/2008 02:56 PM    Wt Readings from Last 3 Encounters:  11/12/19 198 lb (89.8 kg)  11/05/19 198 lb (89.8 kg)  11/02/19 198 lb 6.6 oz (90 kg)     Exam:    Vital Signs: Vital signs may also be detailed in the HPI Ht 4' 11"  (1.499 m)   Wt 198 lb (89.8 kg)   BMI 39.99 kg/m   Wt Readings from Last 3 Encounters:  11/12/19 198 lb (89.8 kg)  11/05/19 198 lb (89.8 kg)  11/02/19 198 lb 6.6 oz (90 kg)   Temp Readings from Last 3 Encounters:  11/02/19 99.5 F (37.5 C) (Oral)  10/27/19 98.4 F (36.9 C)  08/20/19 97.9 F (36.6 C) (Temporal)   BP Readings from Last 3 Encounters:  11/02/19 (!) 145/73  10/27/19 (!) 144/74  08/20/19 (!) 86/55   Pulse Readings from Last 3 Encounters:  11/02/19 86  10/27/19 88  08/20/19 87     Well nourished, well developed female in no acute distress. Constitutional:  oriented to person, place, and time. No distress.  Head: Normocephalic and atraumatic.  Eyes:  no discharge. No scleral icterus.  Neck: Normal range of motion. Neck supple.  Pulmonary/Chest: No audible wheezing, no distress, appears comfortable Musculoskeletal: Normal range of motion.  no  tenderness or deformity.  Neurological:   Coordination normal. Full exam not  performed Skin:  No rash Psychiatric:  normal mood and affect. behavior is normal. Thought content normal.    ASSESSMENT & PLAN:    Problem List Items Addressed This Visit      Cardiology Problems   HYPERTENSION, BENIGN ESSENTIAL   Type 2 diabetes with decreased circulation (HCC) - Primary   HLD (hyperlipidemia)     Chest pain Prior history of chest pain Symptoms atypical in nature Risk factors including diabetes, hyperlipidemia Recommend we start with echocardiogram May need to consider stress testing  Diabetes type 2 poorly controlled Discussed diet, numbers have trended down in the past 3 years from A1c of 13 down to 7.8 Encouragement given  Leg weakness/falls Recommend she discuss with primary care, she would benefit from a walker to assist with recovering her leg strength  Hyperlipidemia On high-dose Lipitor with Zetia, no changes made  Essential hypertension Continue current medications, Recommend we monitor more numbers, further adjustments may be needed  Acute on chronic renal failure Recent bump in creatinine, [place] following  COVID-19 Education: The signs and symptoms of COVID-19 were discussed with the patient and how to seek care for testing (follow up with PCP or arrange E-visit).  The importance of social distancing was discussed today.  Patient Risk:   After full review of this patients clinical status, I feel that they are  at least moderate risk at this time.  Time:   Today, I have spent 25 minutes with the patient with telehealth technology discussing the cardiac and medical problems/diagnoses detailed above   Additional 10 min spent reviewing the chart prior to patient visit today   Medication Adjustments/Labs and Tests Ordered: Current medicines are reviewed at length with the patient today.  Concerns regarding medicines are outlined above.   Tests Ordered: No tests ordered   Medication Changes: No changes made   Disposition:  Follow-up as needed   Signed, Ida Rogue, MD  [place] Office [address] [place] [address], [place], [place] [address]

## 2019-11-11 NOTE — Telephone Encounter (Signed)

## 2019-11-12 ENCOUNTER — Encounter: Payer: Self-pay | Admitting: Cardiovascular Disease

## 2019-11-12 ENCOUNTER — Telehealth (INDEPENDENT_AMBULATORY_CARE_PROVIDER_SITE_OTHER): Payer: Medicare HMO | Admitting: Cardiovascular Disease

## 2019-11-12 ENCOUNTER — Other Ambulatory Visit: Payer: Self-pay

## 2019-11-12 VITALS — Ht 59.0 in | Wt 198.0 lb

## 2019-11-12 DIAGNOSIS — R079 Chest pain, unspecified: Secondary | ICD-10-CM

## 2019-11-12 DIAGNOSIS — I10 Essential (primary) hypertension: Secondary | ICD-10-CM | POA: Diagnosis not present

## 2019-11-12 DIAGNOSIS — E782 Mixed hyperlipidemia: Secondary | ICD-10-CM

## 2019-11-12 DIAGNOSIS — E1151 Type 2 diabetes mellitus with diabetic peripheral angiopathy without gangrene: Secondary | ICD-10-CM | POA: Diagnosis not present

## 2019-11-12 NOTE — Patient Instructions (Addendum)
Request labs from Dr. Francoise Schaumann for cone system  Talk with your primary care doctor about a walker.  Medication Instructions:  No changes  Extra metoprolol as needed for palpitations   If you need a refill on your cardiac medications before your next appointment, please call your pharmacy.    Lab work: No new labs needed   If you have labs (blood work) drawn today and your tests are completely normal, you will receive your results only by: Marland Kitchen MyChart Message (if you have MyChart) OR . A paper copy in the mail If you have any lab test that is abnormal or we need to change your treatment, we will call you to review the results.   Testing/Procedures: Echo for chest pain Your physician has requested that you have an echocardiogram. Echocardiography is a painless test that uses sound waves to create images of your heart. It provides your doctor with information about the size and shape of your heart and how well your heart's chambers and valves are working. This procedure takes approximately one hour. There are no restrictions for this procedure.     Follow-Up: At [facility], you and your health needs are our priority.  As part of our continuing mission to provide you with exceptional heart care, we have created designated Provider Care Teams.  These Care Teams include your primary Cardiologist (physician) and Advanced Practice Providers (APPs -  Physician Assistants and Nurse Practitioners) who all work together to provide you with the care you need, when you need it.  . You will need a follow up appointment as needed  . Providers on your designated Care Team:   . Murray Hodgkins, NP . Christell Faith, PA-C . Marrianne Mood, PA-C  Any Other Special Instructions Will Be Listed Below (If Applicable).  For educational health videos Log in to : [email or web address] Or : [email or web address], password : [email or web address]

## 2019-11-24 ENCOUNTER — Encounter: Payer: Self-pay | Admitting: Family Medicine

## 2019-12-04 ENCOUNTER — Other Ambulatory Visit: Payer: Self-pay

## 2019-12-04 ENCOUNTER — Ambulatory Visit (INDEPENDENT_AMBULATORY_CARE_PROVIDER_SITE_OTHER): Payer: Medicare HMO

## 2019-12-04 DIAGNOSIS — R079 Chest pain, unspecified: Secondary | ICD-10-CM

## 2019-12-22 ENCOUNTER — Ambulatory Visit (INDEPENDENT_AMBULATORY_CARE_PROVIDER_SITE_OTHER): Payer: Medicare HMO | Admitting: Family Medicine

## 2019-12-22 ENCOUNTER — Encounter: Payer: Self-pay | Admitting: Family Medicine

## 2019-12-22 DIAGNOSIS — R2689 Other abnormalities of gait and mobility: Secondary | ICD-10-CM

## 2019-12-22 DIAGNOSIS — R296 Repeated falls: Secondary | ICD-10-CM | POA: Diagnosis not present

## 2019-12-22 DIAGNOSIS — E1142 Type 2 diabetes mellitus with diabetic polyneuropathy: Secondary | ICD-10-CM | POA: Diagnosis not present

## 2019-12-22 DIAGNOSIS — G2581 Restless legs syndrome: Secondary | ICD-10-CM | POA: Diagnosis not present

## 2019-12-22 DIAGNOSIS — G4733 Obstructive sleep apnea (adult) (pediatric): Secondary | ICD-10-CM

## 2019-12-22 DIAGNOSIS — E038 Other specified hypothyroidism: Secondary | ICD-10-CM

## 2019-12-22 MED ORDER — ROPINIROLE HCL 1 MG PO TABS: 1.0000 mg | ORAL_TABLET | Freq: Every day | ORAL | 0 refills | Status: DC

## 2019-12-22 NOTE — Progress Notes (Signed)
Name: [name]   MRN: [id number]    DOB: Sep 08, 1965   Date:12/22/2019       Progress Note  Subjective  Chief Complaint  Chief Complaint  Patient presents with  . Restless leg    Pain in both legs at night.    I connected with  [name] on 12/22/19 at 11:00 AM EDT by telephone and verified that I am speaking with the correct person using two identifiers.   I discussed the limitations, risks, security and privacy concerns of performing an evaluation and management service by telephone and the availability of in person appointments. Staff also discussed with the patient that there may be a patient responsible charge related to this service. Patient Location: at home  Provider Location: [facility]   HPI  RLS: she states she has been on Gabapentin for a long time, she states dose was increased in December she states initially it helped with symptoms but now is back again. She has an urge to move it at night, both feet feels on fire , at times during the day also , it can happen during activity or rest, but worse at night when resting . It causes difficulty for her to fall asleep. She also has cramps at night.   Recurrent Falls: she has fallen 6 times since January 1st, she states she loses her balanced , not associated with syncope. Not seen by neurologist in many years   Hypersomnolence: she states positive for OSA however unable to find a mask that fits her face and is only on nocturnal oxygen, explained that it may be the cause of severe fatigue  DMII: seeing Dr. Ronnald Collum, she states last A1C was high but not sure of the value. She has polyphagia, polydipsia or polyuria. She is complaint with medications daily but she is not compliant with a diabetic diet. She likes bread, pasta, she does not drink sweet beverages/  Hypothyroidism: another cause for fatigue, we will recheck TSH when she is here in person  Bipolar: Dr. Nicolasa Ducking recently decreased dose of Seroquel but she is still very  tired. We will check labs when she returns in person for follow up  Patient Active Problem List   Diagnosis Date Noted  . Abscess of axilla, right 04/16/2019  . Iron deficiency anemia due to chronic blood loss   . Polyp of ascending colon   . Chronic gastric ulcer with hemorrhage   . Type 2 diabetes with decreased circulation ([place]) 11/07/2018  . Vitamin B12 deficiency 08/08/2018  . Encounter for Medicare annual wellness exam 10/30/2017  . Patient is full code 10/30/2017  . Disorder of bursae of shoulder region 10/09/2017  . Coronary artery disease 10/09/2017  . Plantar fascial fibromatosis 10/09/2017  . Tendinitis of wrist 10/09/2017  . Schizoaffective disorder, bipolar type ([place]) 09/28/2017  . Chest pain 09/10/2017  . Pleuritic chest pain 05/27/2017  . Hypertriglyceridemia 12/21/2016  . Pansinusitis 11/15/2016  . 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 Breast cancer screening 09/28/2016  . Urine frequency 09/28/2016  . Preventative health care 09/28/2016  . Fatigue 05/01/2016  . Urinary tract infection 03/14/2016  . Adenomatous colon polyp 10/15/2015  . Airway hyperreactivity 10/15/2015  . Bipolar affective disorder ([place]) 10/15/2015  . CN (constipation) 10/15/2015  . Drug-induced hepatic toxicity 10/15/2015  . Blood in feces 10/15/2015  . HLD (hyperlipidemia) 10/15/2015  . Hypoxemia 10/15/2015  . NASH (nonalcoholic steatohepatitis) 10/15/2015  . Morbid obesity ([place]) 10/15/2015  .  Restless leg syndrome 10/15/2015  . Type 2 diabetes, uncontrolled, with neuropathy ([place]) 10/15/2015  . Elevated alkaline phosphatase level 09/22/2015  . Urinary bladder incontinence 09/22/2015  . Low back pain with left-sided sciatica 08/18/2015  . Encounter for monitoring tricyclic antidepressant therapy 05/13/2015  . Bipolar disorder ([place]) 05/11/2015  . Apnea, sleep 05/05/2015  . Heart valve disease 05/05/2015  . Chronic kidney disease, stage III (moderate) 03/15/2015  .  Migraine 03/12/2015  . COPD (chronic obstructive pulmonary disease) ([place]) 01/20/2015  . 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 GERD (gastroesophageal reflux disease) 01/20/2015  . Hypothyroid   . Vitamin D deficiency   . Urinary incontinence   . Liver lesion   . Hypomagnesemia   . Obstructive sleep apnea 10/20/2013  . Vaginitis 12/19/2011  . PERIPHERAL NEUROPATHY 03/04/2009  . HEMORRHOIDS, EXTERNAL 09/03/2008  . ASTHMA, PERSISTENT, MODERATE 07/04/2007  . INTERSTITIAL CYSTITIS 07/04/2007  . HYPERTENSION, BENIGN ESSENTIAL 06/19/2007    Social History   Tobacco Use  . Smoking status: Never Smoker  . Smokeless tobacco: Never Used  Substance Use Topics  . Alcohol use: No     Current Outpatient Medications:  .  albuterol (VENTOLIN HFA) 108 (90 Base) MCG/ACT inhaler, Inhale 2 puffs into the lungs every 6 (six) hours as needed for wheezing or shortness of breath., Disp: 18 g, Rfl: 3 .  atorvastatin (LIPITOR) 80 MG tablet, Take 80 mg by mouth at bedtime. , Disp: , Rfl:  .  busPIRone (BUSPAR) 10 MG tablet, Take 20 mg by mouth 2 (two) times daily. , Disp: , Rfl:  .  DULoxetine (CYMBALTA) 60 MG capsule, Take 120 mg by mouth daily., Disp: , Rfl:  .  empagliflozin (JARDIANCE) 25 MG TABS tablet, Take 25 mg by mouth daily., Disp: , Rfl:  .  ezetimibe (ZETIA) 10 MG tablet, Take 10 mg by mouth daily. , Disp: , Rfl:  .  fenofibrate micronized (LOFIBRA) 134 MG capsule, Take 134 mg by mouth daily., Disp: , Rfl:  .  gabapentin (NEURONTIN) 300 MG capsule, Take 1 tablet each morning, 1 tablet each afternoon, and 2 tablets at bedtime. (Patient taking differently: Take 300-600 mg by mouth 3 (three) times daily. Take 1 capsule each morning, 1 capsule each afternoon, and 2 capsules at bedtime.), Disp: 360 capsule, Rfl: 1 .  HUMULIN R U-500 KWIKPEN 500 UNIT/ML kwikpen, Inject 70 Units as directed 3 (three) times daily. , Disp: , Rfl:  .  insulin aspart (NOVOLOG) 100 UNIT/ML injection, Inject 5-25  Units into the skin 3 (three) times daily before meals. , Disp: , Rfl:  .  lamoTRIgine (LAMICTAL) 200 MG tablet, Take 200 mg by mouth 2 (two) times daily. , Disp: , Rfl:  .  levothyroxine (SYNTHROID) 50 MCG tablet, 1 TAB daily three days a week,  1.5 TABS four days a week (Mon, Wed, Fri, Sat for example), Disp: 108 tablet, Rfl: 0 .  lisinopril (ZESTRIL) 2.5 MG tablet, TAKE 1 TABLET BY MOUTH EVERY DAY (Patient taking differently: Take 2.5 mg by mouth daily. ), Disp: 90 tablet, Rfl: 1 .  meclizine (ANTIVERT) 12.5 MG tablet, Take 1 tablet (12.5 mg total) by mouth 3 (three) times daily as needed for dizziness., Disp: 30 tablet, Rfl: 1 .  metoprolol tartrate (LOPRESSOR) 25 MG tablet, TAKE 1 TABLET (25MG TOTAL) IN THE MORNING AND 1/2 TABLET IN THE EVENING (Patient taking differently: Take 12.5-25 mg by mouth 2 (two) times daily. TAKE 1 TABLET (25MG TOTAL) IN THE MORNING AND 1/2 TABLET (12.5  mg) IN THE EVENING), Disp: 135 tablet, Rfl: 1 .  Omega-3 Fatty Acids (FISH OIL PO), Take by mouth daily., Disp: , Rfl:  .  ondansetron (ZOFRAN) 4 MG tablet, TAKE 1 TABLET BY MOUTH EVERY 8 HOURS AS NEEDED FOR NAUSEA OR VOMITING (Patient taking differently: Take 4 mg by mouth every 8 (eight) hours as needed for nausea or vomiting. ), Disp: 20 tablet, Rfl: 2 .  ONETOUCH VERIO test strip, , Disp: , Rfl:  .  pantoprazole (PROTONIX) 40 MG tablet, Take 1 tablet (40 mg total) by mouth daily., Disp: 90 tablet, Rfl: 3 .  polyethylene glycol (MIRALAX) 17 g packet, Take 17 g by mouth daily. (Patient taking differently: Take 17 g by mouth daily as needed for mild constipation. ), Disp: 14 each, Rfl: 0 .  QUEtiapine (SEROQUEL) 300 MG tablet, Take 300 mg by mouth at bedtime., Disp: , Rfl:  .  traMADol (ULTRAM) 50 MG tablet, Take 50 mg by mouth every 6 (six) hours as needed., Disp: , Rfl:  .  traZODone (DESYREL) 100 MG tablet, Take 100 mg by mouth at bedtime., Disp: , Rfl:  .  TRULICITY 1.5 DX/4.1OI SOPN, Inject 1.5 mLs as directed every  Sunday. , Disp: , Rfl:  .  ULTICARE SHORT PEN NEEDLES 31G X 8 MM MISC, , Disp: , Rfl:   Allergies  Allergen Reactions  . Mirtazapine Anaphylaxis    REACTION: closes throat  . Morphine And Related     anaphylaxis  . Sulfamethoxazole-Trimethoprim Rash    REACTION: Whelps all over  . Amoxicillin Er Nausea And Vomiting    Has patient had a PCN reaction causing immediate rash, facial/tongue/throat swelling, SOB or lightheadedness with hypotension: No Has patient had a PCN reaction causing severe rash involving mucus membranes or skin necrosis: No Has patient had a PCN reaction that required hospitalization: No Has patient had a PCN reaction occurring within the last 10 years: Yes If all of the above answers are "NO", then may proceed with Cephalosporin use.  . Codeine Itching    REACTION: itch, rash  . Hydrocodone-Acetaminophen Rash    REACTION: rash  . Keflex [Cephalexin] Rash  . Prasterone Nausea And Vomiting    I personally reviewed active problem list, medication list, allergies, family history, social history, health maintenance with the patient/caregiver today.  ROS  Ten systems reviewed and is negative except as mentioned in HPI   Objective  Virtual encounter, vitals not obtained.  There is no height or weight on file to calculate BMI.  Nursing Note and Vital Signs reviewed.  Physical Exam  Awake, alert and oriented  Assessment & Plan  1. RLS (restless legs syndrome)  - rOPINIRole (REQUIP) 1 MG tablet; Take 1 tablet (1 mg total) by mouth at bedtime.  Dispense: 30 tablet; Refill: 0 - Ambulatory referral to Neurology  2. Recurrent falls  - Ambulatory referral to Neurology  3. Balance problem  - Ambulatory referral to Neurology  4. Diabetic polyneuropathy associated with type 2 diabetes mellitus ([place])  - Ambulatory referral to Neurology  5. OSA (obstructive sleep apnea)  Unable to wear CPAP machine, states sleepy all the time and Dr. Nicolasa Ducking does not think  secondary to depression   -Red flags and when to present for emergency care or RTC including fever >101.74F, chest pain, shortness of breath, new/worsening/un-resolving symptoms,  reviewed with patient at time of visit. Follow up and care instructions discussed and provided in AVS. - I discussed the assessment and treatment plan with the  patient. The patient was provided an opportunity to ask questions and all were answered. The patient agreed with the plan and demonstrated an understanding of the instructions.  - The patient was advised to call back or seek an in-person evaluation if the symptoms worsen or if the condition fails to improve as anticipated.  I provided 25 minutes of non-face-to-face time during this encounter.  Loistine Chance, MD

## 2019-12-23 ENCOUNTER — Other Ambulatory Visit: Payer: Self-pay | Admitting: Family Medicine

## 2019-12-23 DIAGNOSIS — E038 Other specified hypothyroidism: Secondary | ICD-10-CM

## 2019-12-23 NOTE — Telephone Encounter (Signed)
Requested Prescriptions  Pending Prescriptions Disp Refills  . levothyroxine (SYNTHROID) 50 MCG tablet [Pharmacy Med Name: LEVOTHYROXINE SODIUM 50 MCG TAB] 108 tablet 0    Sig: TAKE ONE TABLET DAILY 3 DAYS A WEEK, THEN TAKE ONE AND ONE-HALF TABLETS FOUR DAYS A WEEK     Endocrinology:  Hypothyroid Agents Failed - 12/23/2019  1:58 PM      Failed - TSH needs to be rechecked within 3 months after an abnormal result. Refill until TSH is due.      Passed - TSH in normal range and within 360 days    TSH  Date Value Ref Range Status  02/24/2019 1.17 mIU/L Final    Comment:              Reference Range .           > or = 20 Years  0.40-4.50 .                Pregnancy Ranges           First trimester    0.26-2.66           Second trimester   0.55-2.73           Third trimester    0.43-2.91          Passed - Valid encounter within last 12 months    Recent Outpatient Visits          Yesterday RLS (restless legs syndrome)   [facility] Steele Sizer, MD   1 month ago Dizzy spells   [facility] Towanda Malkin, MD   4 months ago HYPERTENSION, [facility] Hubbard Hartshorn, FNP   7 months ago Contact dermatitis, unspecified contact dermatitis type, unspecified trigger   [place], NP   8 months ago Paresthesia of both hands   [place], [place]      Future Appointments            In 3 weeks Steele Sizer, MD [facility], [place]   In 9 months  [facility] [place], [facility]

## 2020-01-06 ENCOUNTER — Other Ambulatory Visit: Payer: Self-pay | Admitting: Family Medicine

## 2020-01-06 NOTE — Telephone Encounter (Signed)
Requested Prescriptions  Pending Prescriptions Disp Refills  . lisinopril (ZESTRIL) 2.5 MG tablet [Pharmacy Med Name: LISINOPRIL 2.5 MG TAB] 90 tablet 1    Sig: TAKE 1 TABLET BY MOUTH EVERY DAY     Cardiovascular:  ACE Inhibitors Failed - 01/06/2020 11:42 AM      Failed - Cr in normal range and within 180 days    Creat  Date Value Ref Range Status  11/08/2018 1.12 (H) 0.50 - 1.05 mg/dL Final    Comment:    For patients >56 years of age, the reference limit for Creatinine is approximately 13% higher for people identified as African-American. .    Creatinine, Ser  Date Value Ref Range Status  11/02/2019 1.77 (H) 0.44 - 1.00 mg/dL Final   Creatinine,U  Date Value Ref Range Status  06/08/2008 93.0 mg/dL Final   Creatinine, Urine  Date Value Ref Range Status  10/09/2017 72 20 - 275 mg/dL Final         Failed - Last BP in normal range    BP Readings from Last 1 Encounters:  11/02/19 (!) 145/73         Passed - K in normal range and within 180 days    Potassium  Date Value Ref Range Status  11/02/2019 4.4 3.5 - 5.1 mmol/L Final  10/09/2014 4.7 3.5 - 5.1 mmol/L Final         Passed - Patient is not pregnant      Passed - Valid encounter within last 6 months    Recent Outpatient Visits          2 weeks ago RLS (restless legs syndrome)   [facility] Steele Sizer, MD   2 months ago Dizzy spells   [facility] Towanda Malkin, MD   4 months ago HYPERTENSION, [facility] Hubbard Hartshorn, FNP   8 months ago Contact dermatitis, unspecified contact dermatitis type, unspecified trigger   [place], NP   9 months ago Paresthesia of both hands   [place], [place]      Future Appointments            In 1 week Steele Sizer, MD [facility], [place]   In 8 months  [facility],  [facility]

## 2020-01-15 ENCOUNTER — Other Ambulatory Visit: Payer: Self-pay | Admitting: Emergency Medicine

## 2020-01-16 ENCOUNTER — Other Ambulatory Visit: Payer: Self-pay | Admitting: Family Medicine

## 2020-01-16 DIAGNOSIS — G2581 Restless legs syndrome: Secondary | ICD-10-CM

## 2020-01-19 ENCOUNTER — Ambulatory Visit: Payer: Medicare HMO | Admitting: Family Medicine

## 2020-01-26 ENCOUNTER — Other Ambulatory Visit: Payer: Self-pay

## 2020-01-26 ENCOUNTER — Ambulatory Visit (INDEPENDENT_AMBULATORY_CARE_PROVIDER_SITE_OTHER): Payer: Medicare HMO | Admitting: Family Medicine

## 2020-01-26 ENCOUNTER — Encounter: Payer: Self-pay | Admitting: Family Medicine

## 2020-01-26 DIAGNOSIS — Z794 Long term (current) use of insulin: Secondary | ICD-10-CM

## 2020-01-26 DIAGNOSIS — E1165 Type 2 diabetes mellitus with hyperglycemia: Secondary | ICD-10-CM | POA: Diagnosis not present

## 2020-01-26 DIAGNOSIS — J069 Acute upper respiratory infection, unspecified: Secondary | ICD-10-CM

## 2020-01-26 DIAGNOSIS — G2581 Restless legs syndrome: Secondary | ICD-10-CM

## 2020-01-26 DIAGNOSIS — E1140 Type 2 diabetes mellitus with diabetic neuropathy, unspecified: Secondary | ICD-10-CM

## 2020-01-26 DIAGNOSIS — IMO0002 Reserved for concepts with insufficient information to code with codable children: Secondary | ICD-10-CM

## 2020-01-26 MED ORDER — BENZONATATE 100 MG PO CAPS: 100.0000 mg | ORAL_CAPSULE | Freq: Two times a day (BID) | ORAL | 0 refills | Status: DC | PRN

## 2020-01-26 MED ORDER — FLUTICASONE PROPIONATE 50 MCG/ACT NA SUSP: 2.0000 | Freq: Every day | NASAL | 0 refills | Status: DC

## 2020-01-26 NOTE — Progress Notes (Signed)
Name: [name]   MRN: [id number]    DOB: May 29, 1966   Date:01/26/2020       Progress Note  Subjective  Chief Complaint  Chief Complaint  Patient presents with  . Cough    x 1-2 days. Cough is dry  . Nasal Congestion    Feels like drainage running down the back of her throat.  . Shortness of Breath    Hurts to breath.  . Chest Pain    She has chest pain from coughing.  . Leg Pain    Cramps really bad. Gabapentin is not working.  . Foot Pain    HPI  URI: she states symptoms started suddenly two days ago, she noticed nasal congestion, followed by rhinorrhea, dry cough, chest congestion, she has some sob only with coughing spells No fever, chills, headaches, sore throat. Discussed importance of self isolating and getting checked for COVID-19, avoiding sedating cough medicationotc  Leg cramps; she has an appointment with neurologist next week. She never started Requip because of problems with bubble wrap at pharmacy, but will discuss it with neurologist next week  DMII: reviewed last A1C done at Dr. Ronnald Collum, she states glucose is all over the place, sometimes as high as 140. She is not sure if she is taking novolin and humilin R, explained both immediate released, she thinks she is on a basal insulin. We will contact pharmacy to find out correct dose and type of insulin. Explained importance of dietary compliance    Patient Active Problem List   Diagnosis Date Noted  . Abscess of axilla, right 04/16/2019  . Iron deficiency anemia due to chronic blood loss   . Polyp of ascending colon   . Chronic gastric ulcer with hemorrhage   . Type 2 diabetes with decreased circulation ([place]) 11/07/2018  . Vitamin B12 deficiency 08/08/2018  . Encounter for Medicare annual wellness exam 10/30/2017  . Patient is full code 10/30/2017  . Disorder of bursae of shoulder region 10/09/2017  . Coronary artery disease 10/09/2017  . Plantar fascial fibromatosis 10/09/2017  . Tendinitis of wrist  10/09/2017  . Schizoaffective disorder, bipolar type ([place]) 09/28/2017  . Chest pain 09/10/2017  . Pleuritic chest pain 05/27/2017  . Hypertriglyceridemia 12/21/2016  . Pansinusitis 11/15/2016  . 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 Breast cancer screening 09/28/2016  . Urine frequency 09/28/2016  . Preventative health care 09/28/2016  . Fatigue 05/01/2016  . Urinary tract infection 03/14/2016  . Adenomatous colon polyp 10/15/2015  . Bipolar affective disorder ([place]) 10/15/2015  . CN (constipation) 10/15/2015  . Drug-induced hepatic toxicity 10/15/2015  . Blood in feces 10/15/2015  . HLD (hyperlipidemia) 10/15/2015  . Hypoxemia 10/15/2015  . NASH (nonalcoholic steatohepatitis) 10/15/2015  . Morbid obesity ([place]) 10/15/2015  . Restless leg syndrome 10/15/2015  . Type 2 diabetes, uncontrolled, with neuropathy ([place]) 10/15/2015  . Elevated alkaline phosphatase level 09/22/2015  . Urinary bladder incontinence 09/22/2015  . Low back pain with left-sided sciatica 08/18/2015  . Encounter for monitoring tricyclic antidepressant therapy 05/13/2015  . Bipolar disorder ([place]) 05/11/2015  . Apnea, sleep 05/05/2015  . Heart valve disease 05/05/2015  . Chronic kidney disease, stage III (moderate) 03/15/2015  . Migraine 03/12/2015  . COPD (chronic obstructive pulmonary disease) ([place]) 01/20/2015  . 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[place]) 01/20/2015  . GERD (gastroesophageal reflux disease) 01/20/2015  . Hypothyroid   . Vitamin D deficiency   . Urinary incontinence   . Liver lesion   . Hypomagnesemia   .  Obstructive sleep apnea 10/20/2013  . Vaginitis 12/19/2011  . PERIPHERAL NEUROPATHY 03/04/2009  . HEMORRHOIDS, EXTERNAL 09/03/2008  . ASTHMA, PERSISTENT, MODERATE 07/04/2007  . INTERSTITIAL CYSTITIS 07/04/2007  . Allergic rhinitis 07/04/2007  . HYPERTENSION, BENIGN ESSENTIAL 06/19/2007    Past Surgical History:  Procedure Laterality Date  . ABDOMINAL HYSTERECTOMY      Partial  . bladder stim  2007  . BLADDER SURGERY     x 2. tacking of bladder and the other was the stimulator placement  . CHOLECYSTECTOMY    . COLONOSCOPY WITH PROPOFOL N/A 03/04/2019   Procedure: COLONOSCOPY WITH PROPOFOL;  Surgeon: [name], MD;  Location: [facility] ENDOSCOPY;  Service: Endoscopy;  Laterality: N/A;  . ESOPHAGOGASTRODUODENOSCOPY (EGD) WITH PROPOFOL N/A 03/04/2019   Procedure: ESOPHAGOGASTRODUODENOSCOPY (EGD) WITH PROPOFOL;  Surgeon: [name], MD;  Location: [facility] ENDOSCOPY;  Service: Endoscopy;  Laterality: N/A;  . INCISION AND DRAINAGE ABSCESS Right 04/16/2019   Procedure: INCISION AND DRAINAGE ABSCESS;  Surgeon: Benjamine Sprague, DO;  Location: [facility] ORS;  Service: General;  Laterality: Right;  . INTERSTIM-GENERATOR CHANGE N/A 12/31/2017   Procedure: INTERSTIM-GENERATOR CHANGE;  Surgeon: Bjorn Loser, MD;  Location: [facility] ORS;  Service: Urology;  Laterality: N/A;  . LIVER BIOPSY  March 2016   Negative  . LUNG BIOPSY  2015   small spot on lung  . TUBAL LIGATION    . UPPER GI ENDOSCOPY    . WRIST SURGERY Right 11/02/2019    Family History  Problem Relation Age of Onset  . Diabetes Mother   . Heart disease Mother   . Hyperlipidemia Mother   . Hypertension Mother   . Kidney disease Mother   . Mental illness Mother   . Hypothyroidism Mother   . Stroke Mother   . Osteoporosis Mother   . Glaucoma Mother   . Congestive Heart Failure Mother   . Hypertension Father   . Asthma Daughter   . Cancer Daughter        throat  . Bipolar disorder Daughter   . Heart disease Maternal Grandfather   . Rheum arthritis Maternal Grandfather   . Cancer Maternal Grandfather        liver  . Kidney disease Maternal Grandfather   . Cancer Paternal Grandmother        lung  . Kidney disease Brother   . Breast cancer Maternal Grandmother 60  . Bipolar disorder Daughter   . Hypertension Daughter   . Restless legs syndrome Daughter     Social History   Tobacco Use  . Smoking  status: Never Smoker  . Smokeless tobacco: Never Used  Substance Use Topics  . Alcohol use: No     Current Outpatient Medications:  .  albuterol (VENTOLIN HFA) 108 (90 Base) MCG/ACT inhaler, Inhale 2 puffs into the lungs every 6 (six) hours as needed for wheezing or shortness of breath., Disp: 18 g, Rfl: 3 .  atorvastatin (LIPITOR) 80 MG tablet, Take 80 mg by mouth at bedtime. , Disp: , Rfl:  .  busPIRone (BUSPAR) 10 MG tablet, Take 20 mg by mouth 2 (two) times daily. , Disp: , Rfl:  .  citalopram (CELEXA) 20 MG tablet, Take 20 mg by mouth daily., Disp: , Rfl:  .  empagliflozin (JARDIANCE) 25 MG TABS tablet, Take 25 mg by mouth daily., Disp: , Rfl:  .  ezetimibe (ZETIA) 10 MG tablet, Take 10 mg by mouth daily. , Disp: , Rfl:  .  fenofibrate micronized (LOFIBRA) 134 MG capsule, Take 134 mg by  mouth daily., Disp: , Rfl:  .  gabapentin (NEURONTIN) 300 MG capsule, Take 1 tablet each morning, 1 tablet each afternoon, and 2 tablets at bedtime. (Patient taking differently: Take 300-600 mg by mouth 3 (three) times daily. Take 1 capsule each morning, 1 capsule each afternoon, and 2 capsules at bedtime.), Disp: 360 capsule, Rfl: 1 .  HUMULIN R U-500 KWIKPEN 500 UNIT/ML kwikpen, Inject 70 Units as directed 3 (three) times daily. , Disp: , Rfl:  .  insulin aspart (NOVOLOG) 100 UNIT/ML injection, Inject 5-25 Units into the skin 3 (three) times daily before meals. , Disp: , Rfl:  .  lamoTRIgine (LAMICTAL) 200 MG tablet, Take 200 mg by mouth 2 (two) times daily. , Disp: , Rfl:  .  levothyroxine (SYNTHROID) 50 MCG tablet, TAKE ONE TABLET DAILY 3 DAYS A WEEK, THEN TAKE ONE AND ONE-HALF TABLETS FOUR DAYS A WEEK, Disp: 108 tablet, Rfl: 0 .  lisinopril (ZESTRIL) 2.5 MG tablet, TAKE 1 TABLET BY MOUTH EVERY DAY, Disp: 90 tablet, Rfl: 1 .  meclizine (ANTIVERT) 12.5 MG tablet, Take 1 tablet (12.5 mg total) by mouth 3 (three) times daily as needed for dizziness., Disp: 30 tablet, Rfl: 1 .  metoprolol tartrate  (LOPRESSOR) 25 MG tablet, TAKE 1 TABLET (25MG TOTAL) IN THE MORNING AND 1/2 TABLET IN THE EVENING (Patient taking differently: Take 12.5-25 mg by mouth 2 (two) times daily. TAKE 1 TABLET (25MG TOTAL) IN THE MORNING AND 1/2 TABLET (12.5 mg) IN THE EVENING), Disp: 135 tablet, Rfl: 1 .  Omega-3 Fatty Acids (FISH OIL PO), Take by mouth daily., Disp: , Rfl:  .  ondansetron (ZOFRAN) 4 MG tablet, TAKE 1 TABLET BY MOUTH EVERY 8 HOURS AS NEEDED FOR NAUSEA OR VOMITING (Patient taking differently: Take 4 mg by mouth every 8 (eight) hours as needed for nausea or vomiting. ), Disp: 20 tablet, Rfl: 2 .  ONETOUCH VERIO test strip, , Disp: , Rfl:  .  pantoprazole (PROTONIX) 40 MG tablet, Take 1 tablet (40 mg total) by mouth daily., Disp: 90 tablet, Rfl: 3 .  polyethylene glycol (MIRALAX) 17 g packet, Take 17 g by mouth daily. (Patient taking differently: Take 17 g by mouth daily as needed for mild constipation. ), Disp: 14 each, Rfl: 0 .  QUEtiapine (SEROQUEL) 300 MG tablet, Take 300 mg by mouth at bedtime., Disp: , Rfl:  .  traMADol (ULTRAM) 50 MG tablet, Take 50 mg by mouth every 6 (six) hours as needed., Disp: , Rfl:  .  traZODone (DESYREL) 100 MG tablet, Take 100 mg by mouth at bedtime., Disp: , Rfl:  .  TRULICITY 1.5 ZD/6.3OV SOPN, Inject 1.5 mLs as directed every Sunday. , Disp: , Rfl:  .  ULTICARE SHORT PEN NEEDLES 31G X 8 MM MISC, , Disp: , Rfl:   Allergies  Allergen Reactions  . Mirtazapine Anaphylaxis    REACTION: closes throat  . Morphine And Related     anaphylaxis  . Sulfamethoxazole-Trimethoprim Rash    REACTION: [place] all over  . Amoxicillin Er Nausea And Vomiting    Has patient had a PCN reaction causing immediate rash, facial/tongue/throat swelling, SOB or lightheadedness with hypotension: No Has patient had a PCN reaction causing severe rash involving mucus membranes or skin necrosis: No Has patient had a PCN reaction that required hospitalization: No Has patient had a PCN reaction  occurring within the last 10 years: Yes If all of the above answers are "NO", then may proceed with Cephalosporin use.  . Codeine Itching  REACTION: itch, rash  . Hydrocodone-Acetaminophen Rash    REACTION: rash  . Keflex [Cephalexin] Rash  . Prasterone Nausea And Vomiting    I personally reviewed active problem list, medication list, allergies, family history, social history, health maintenance with the patient/caregiver today.   ROS  Ten systems reviewed and is negative except as mentioned in HPI   Objective  There were no vitals filed for this visit.  There is no height or weight on file to calculate BMI.  Physical Exam  Awake, alert, mild cough during our conversation   Recent Results (from the past 2160 hour(s))  Lipase, blood     Status: None   Collection Time: 11/02/19  9:48 AM  Result Value Ref Range   Lipase 29 11 - 51 U/L    Comment: Performed at [facility], [address] [place], [place] [address]  Comprehensive metabolic panel     Status: Abnormal   Collection Time: 11/02/19  9:48 AM  Result Value Ref Range   Sodium 134 (L) 135 - 145 mmol/L   Potassium 4.4 3.5 - 5.1 mmol/L   Chloride 96 (L) 98 - 111 mmol/L   CO2 26 22 - 32 mmol/L   Glucose, Bld 101 (H) 70 - 99 mg/dL    Comment: Glucose reference range applies only to samples taken after fasting for at least 8 hours.   BUN 47 (H) 6 - 20 mg/dL   Creatinine, Ser 1.77 (H) 0.44 - 1.00 mg/dL   Calcium 9.1 8.9 - 10.3 mg/dL   Total Protein 6.3 (L) 6.5 - 8.1 g/dL   Albumin 3.7 3.5 - 5.0 g/dL   AST 43 (H) 15 - 41 U/L   ALT 34 0 - 44 U/L   Alkaline Phosphatase 43 38 - 126 U/L   Total Bilirubin 0.5 0.3 - 1.2 mg/dL   GFR calc non Af Amer 32 (L) >60 mL/min   GFR calc Af Amer 37 (L) >60 mL/min   Anion gap 12 5 - 15    Comment: Performed at [facility], [address] [place], [place] [address]  CBC     Status: None   Collection Time: 11/02/19  9:48 AM  Result Value Ref Range   WBC 9.0  4.0 - 10.5 K/uL   RBC 4.38 3.87 - 5.11 MIL/uL   Hemoglobin 12.7 12.0 - 15.0 g/dL   HCT 38.8 36.0 - 46.0 %   MCV 88.6 80.0 - 100.0 fL   MCH 29.0 26.0 - 34.0 pg   MCHC 32.7 30.0 - 36.0 g/dL   RDW 14.0 11.5 - 15.5 %   Platelets 312 150 - 400 K/uL   nRBC 0.0 0.0 - 0.2 %    Comment: Performed at [facility], [address] [place], [place] [address]  Urinalysis, Complete w Microscopic     Status: Abnormal   Collection Time: 11/02/19  2:18 PM  Result Value Ref Range   Color, Urine YELLOW (A) YELLOW   APPearance CLEAR (A) CLEAR   Specific Gravity, Urine 1.015 1.005 - 1.030   pH 5.0 5.0 - 8.0   Glucose, UA 150 (A) NEGATIVE mg/dL   Hgb urine dipstick NEGATIVE NEGATIVE   Bilirubin Urine NEGATIVE NEGATIVE   Ketones, ur NEGATIVE NEGATIVE mg/dL   Protein, ur NEGATIVE NEGATIVE mg/dL   Nitrite NEGATIVE NEGATIVE   Leukocytes,Ua NEGATIVE NEGATIVE   RBC / HPF 0-5 0 - 5 RBC/hpf   WBC, UA 0-5 0 - 5 WBC/hpf   Bacteria, UA  NONE SEEN NONE SEEN   Squamous Epithelial / LPF 0-5 0 - 5    Comment: Performed at [facility], [address] [place], [place] [address]      PHQ2/9: Depression screen [facility] 2/9 01/26/2020 12/22/2019 11/05/2019 09/25/2019 08/12/2019  Decreased Interest 0 0 0 0 0  Down, Depressed, Hopeless 0 0 0 0 0  PHQ - 2 Score 0 0 0 0 0  Altered sleeping 0 0 0 - 0  Tired, decreased energy 0 0 2 - 1  Change in appetite 0 0 0 - 1  Feeling bad or failure about yourself  0 0 0 - 0  Trouble concentrating 0 0 0 - 0  Moving slowly or fidgety/restless 0 0 0 - 0  Suicidal thoughts 0 0 0 - 0  PHQ-9 Score 0 0 2 - 2  Difficult doing work/chores - - Somewhat difficult - Somewhat difficult  Some recent data might be hidden    phq 9 is negative   Fall Risk: Fall Risk  01/26/2020 12/22/2019 11/05/2019 09/25/2019 08/12/2019  Falls in the past year? 0 0 1 0 0  Comment - - - - -  Number falls in past yr: 0 0 1 0 0  Comment - - - - -  Injury with Fall? 0 0 0 0 0  Comment - - - - -   Risk for fall due to : - - - No Fall Risks -  Follow up - - - Falls prevention discussed Falls evaluation completed     Functional Status Survey: Is the patient deaf or have difficulty hearing?: No Does the patient have difficulty seeing, even when wearing glasses/contacts?: No Does the patient have difficulty concentrating, remembering, or making decisions?: No Does the patient have difficulty walking or climbing stairs?: Yes Does the patient have difficulty dressing or bathing?: No Does the patient have difficulty doing errands alone such as visiting a doctor's office or shopping?: No    Assessment & Plan  1. Viral upper respiratory tract infection  Discussed getting checked for COVID -19 and self isolation  - benzonatate (TESSALON) 100 MG capsule; Take 1-2 capsules (100-200 mg total) by mouth 2 (two) times daily as needed.  Dispense: 40 capsule; Refill: 0 - fluticasone (FLONASE) 50 MCG/ACT nasal spray; Place 2 sprays into both nostrils daily.  Dispense: 16 g; Refill: 0  2. Type 2 diabetes, uncontrolled, with neuropathy ([place])  Patient states she is taking long acting insulin, but per Dr. De Hollingshead note on SSI and insulin R, I will forward note to him to verify  3. RLS (restless legs syndrome)  Keep follow up with neurologist

## 2020-01-31 ENCOUNTER — Other Ambulatory Visit: Payer: Self-pay

## 2020-01-31 ENCOUNTER — Emergency Department
Admission: EM | Admit: 2020-01-31 | Discharge: 2020-01-31 | Disposition: A | Discharge status: Home / Self Care | Payer: Medicare HMO | Attending: Emergency Medicine | Admitting: Emergency Medicine

## 2020-01-31 DIAGNOSIS — I2510 Atherosclerotic heart disease of native coronary artery without angina pectoris: Secondary | ICD-10-CM | POA: Diagnosis not present

## 2020-01-31 DIAGNOSIS — Z794 Long term (current) use of insulin: Secondary | ICD-10-CM | POA: Insufficient documentation

## 2020-01-31 DIAGNOSIS — Z79899 Other long term (current) drug therapy: Secondary | ICD-10-CM | POA: Diagnosis not present

## 2020-01-31 DIAGNOSIS — I509 Heart failure, unspecified: Secondary | ICD-10-CM | POA: Insufficient documentation

## 2020-01-31 DIAGNOSIS — E1122 Type 2 diabetes mellitus with diabetic chronic kidney disease: Secondary | ICD-10-CM | POA: Diagnosis not present

## 2020-01-31 DIAGNOSIS — E039 Hypothyroidism, unspecified: Secondary | ICD-10-CM | POA: Diagnosis not present

## 2020-01-31 DIAGNOSIS — R300 Dysuria: Secondary | ICD-10-CM | POA: Diagnosis present

## 2020-01-31 DIAGNOSIS — N3000 Acute cystitis without hematuria: Secondary | ICD-10-CM | POA: Insufficient documentation

## 2020-01-31 DIAGNOSIS — D485 Neoplasm of uncertain behavior of skin: Secondary | ICD-10-CM | POA: Diagnosis not present

## 2020-01-31 DIAGNOSIS — I130 Hypertensive heart and chronic kidney disease with heart failure and stage 1 through stage 4 chronic kidney disease, or unspecified chronic kidney disease: Secondary | ICD-10-CM | POA: Diagnosis not present

## 2020-01-31 DIAGNOSIS — N183 Chronic kidney disease, stage 3 unspecified: Secondary | ICD-10-CM | POA: Diagnosis not present

## 2020-01-31 DIAGNOSIS — J45909 Unspecified asthma, uncomplicated: Secondary | ICD-10-CM | POA: Insufficient documentation

## 2020-01-31 LAB — URINALYSIS, COMPLETE (UACMP) WITH MICROSCOPIC
Bilirubin Urine: NEGATIVE
Glucose, UA: >=500 mg/dL — AB
Ketones, ur: NEGATIVE mg/dL
Nitrite: NEGATIVE
Protein, ur: NEGATIVE mg/dL
Specific Gravity, Urine: 1.026 (ref 1.005–1.030)
WBC, UA: >50 WBC/hpf — ABNORMAL HIGH (ref 0–5)
pH: 6 (ref 5.0–8.0)

## 2020-01-31 MED ORDER — NITROFURANTOIN MONOHYD MACRO 100 MG PO CAPS: 100.0000 mg | ORAL_CAPSULE | Freq: Two times a day (BID) | ORAL | 0 refills | Status: AC

## 2020-01-31 MED ORDER — LIDOCAINE HCL (PF) 1 % IJ SOLN
INTRAMUSCULAR | Status: AC
  Administered 2020-01-31: 2 mL
  Filled 2020-01-31: qty 5

## 2020-01-31 MED ORDER — CEFTRIAXONE SODIUM 1 G IJ SOLR
1.0000 g | INTRAMUSCULAR | Status: DC
  Administered 2020-01-31: 1 g via INTRAMUSCULAR
  Filled 2020-01-31: qty 10

## 2020-01-31 NOTE — ED Triage Notes (Signed)
Pt comes POV with burning urination, lower pelvic pain starting this morning.

## 2020-01-31 NOTE — ED Provider Notes (Signed)
[facility] Emergency Department Provider Note ____________________________________________  Time seen: 1730  I have reviewed the triage vital signs and the nursing notes.  HISTORY  Chief Complaint  Urinary Tract Infection   HPI [name] is a 55 y.o. female presents to the ER today with complaint of urinary urgency, frequency, dysuria and pelvic pressure.  She reports it started this morning.  She denies any blood in her urine.  She denies vaginal discharge, itching, irritation, odor or abnormal bleeding.  She denies fever, chills, nausea or low back pain.  She reports a history of UTIs and reports this feels the same.  She has no history of kidney stones.  She has not taken any medications OTC PTA.  Past Medical History:  Diagnosis Date  . Anxiety   . Arthritis   . Asthma   . Bipolar depression ([place])   . CHF (congestive heart failure) ([place])   . Chronic headaches    migraines have lessened  . Chronic kidney disease    ddstage 111  . COPD (chronic obstructive pulmonary disease) ([place])   . Coronary artery disease   . Depression   . Diabetic neuropathy ([place])   . Dyspnea    uses inhalers for this  . Dysrhythmia    rapid  . Fatty liver   . GERD (gastroesophageal reflux disease)   . Headache   . Heart attack ([place]) 2009  . Hyperlipidemia   . Hypertension   . Hypomagnesemia   . Hypothyroid   . Irregular heart rhythm   . Liver lesion    Favored to be benign on CT; MRI of abd w/contract in Aug 2016. Liver Biopsy-Negative, March 2016  . Morbid obesity ([place])   . OSA (obstructive sleep apnea)   . Peripheral vascular disease ([place])   . Pinched nerve    Back  . Pneumonia   . RLS (restless legs syndrome)   . Seasonal allergies   . Sleep apnea    uses oxygen at night 2L d/t cpap did not fit  . Type 2 diabetes, uncontrolled, with neuropathy ([place]) 10/15/2015   Overview:  Microalbumin 7.7 12/2010: Hgb A1c 10.2% 12/2010 and 9.7% 03/2011, eye exam 10/2009 [facility]  DM clinic   . Urinary incontinence   . Vitamin D deficiency     Patient Active Problem List   Diagnosis Date Noted  . Abscess of axilla, right 04/16/2019  . Iron deficiency anemia due to chronic blood loss   . Polyp of ascending colon   . Chronic gastric ulcer with hemorrhage   . Type 2 diabetes with decreased circulation ([place]) 11/07/2018  . Vitamin B12 deficiency 08/08/2018  . Encounter for Medicare annual wellness exam 10/30/2017  . Patient is full code 10/30/2017  . Disorder of bursae of shoulder region 10/09/2017  . Coronary artery disease 10/09/2017  . Plantar fascial fibromatosis 10/09/2017  . Tendinitis of wrist 10/09/2017  . Schizoaffective disorder, bipolar type ([place]) 09/28/2017  . Chest pain 09/10/2017  . Pleuritic chest pain 05/27/2017  . Hypertriglyceridemia 12/21/2016  . Pansinusitis 11/15/2016  . 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 Breast cancer screening 09/28/2016  . Urine frequency 09/28/2016  . Preventative health care 09/28/2016  . Fatigue 05/01/2016  . Urinary tract infection 03/14/2016  . Adenomatous colon polyp 10/15/2015  . Bipolar affective disorder ([place]) 10/15/2015  . CN (constipation) 10/15/2015  . Drug-induced hepatic toxicity 10/15/2015  . Blood in feces 10/15/2015  . HLD (hyperlipidemia) 10/15/2015  . Hypoxemia  10/15/2015  . NASH (nonalcoholic steatohepatitis) 10/15/2015  . Morbid obesity ([place]) 10/15/2015  . Restless leg syndrome 10/15/2015  . Type 2 diabetes, uncontrolled, with neuropathy ([place]) 10/15/2015  . Elevated alkaline phosphatase level 09/22/2015  . Urinary bladder incontinence 09/22/2015  . Low back pain with left-sided sciatica 08/18/2015  . Encounter for monitoring tricyclic antidepressant therapy 05/13/2015  . Bipolar disorder ([place]) 05/11/2015  . Apnea, sleep 05/05/2015  . Heart valve disease 05/05/2015  . Chronic kidney disease, stage III (moderate) 03/15/2015  . Migraine 03/12/2015  . COPD (chronic  obstructive pulmonary disease) ([place]) 01/20/2015  . 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 GERD (gastroesophageal reflux disease) 01/20/2015  . Hypothyroid   . Vitamin D deficiency   . Urinary incontinence   . Liver lesion   . Hypomagnesemia   . Obstructive sleep apnea 10/20/2013  . Vaginitis 12/19/2011  . PERIPHERAL NEUROPATHY 03/04/2009  . HEMORRHOIDS, EXTERNAL 09/03/2008  . ASTHMA, PERSISTENT, MODERATE 07/04/2007  . INTERSTITIAL CYSTITIS 07/04/2007  . Allergic rhinitis 07/04/2007  . HYPERTENSION, BENIGN ESSENTIAL 06/19/2007    Past Surgical History:  Procedure Laterality Date  . ABDOMINAL HYSTERECTOMY     Partial  . bladder stim  2007  . BLADDER SURGERY     x 2. tacking of bladder and the other was the stimulator placement  . CHOLECYSTECTOMY    . COLONOSCOPY WITH PROPOFOL N/A 03/04/2019   Procedure: COLONOSCOPY WITH PROPOFOL;  Surgeon: [name], MD;  Location: [facility] ENDOSCOPY;  Service: Endoscopy;  Laterality: N/A;  . ESOPHAGOGASTRODUODENOSCOPY (EGD) WITH PROPOFOL N/A 03/04/2019   Procedure: ESOPHAGOGASTRODUODENOSCOPY (EGD) WITH PROPOFOL;  Surgeon: [name], MD;  Location: [facility] ENDOSCOPY;  Service: Endoscopy;  Laterality: N/A;  . INCISION AND DRAINAGE ABSCESS Right 04/16/2019   Procedure: INCISION AND DRAINAGE ABSCESS;  Surgeon: Benjamine Sprague, DO;  Location: [facility] ORS;  Service: General;  Laterality: Right;  . INTERSTIM-GENERATOR CHANGE N/A 12/31/2017   Procedure: INTERSTIM-GENERATOR CHANGE;  Surgeon: Bjorn Loser, MD;  Location: [facility] ORS;  Service: Urology;  Laterality: N/A;  . LIVER BIOPSY  March 2016   Negative  . LUNG BIOPSY  2015   small spot on lung  . TUBAL LIGATION    . UPPER GI ENDOSCOPY    . WRIST SURGERY Right 11/02/2019    Prior to Admission medications   Medication Sig Start Date End Date Taking? Authorizing Provider  albuterol (VENTOLIN HFA) 108 (90 Base) MCG/ACT inhaler Inhale 2 puffs into the lungs every 6 (six) hours as needed for  wheezing or shortness of breath. 08/12/19   Hubbard Hartshorn, FNP  atorvastatin (LIPITOR) 80 MG tablet Take 80 mg by mouth at bedtime.     [provider]  benzonatate (TESSALON) 100 MG capsule Take 1-2 capsules (100-200 mg total) by mouth 2 (two) times daily as needed. 01/26/20   Steele Sizer, MD  busPIRone (BUSPAR) 10 MG tablet Take 20 mg by mouth 2 (two) times daily.     [provider]  citalopram (CELEXA) 20 MG tablet Take 20 mg by mouth daily. 01/16/20   [provider]  empagliflozin (JARDIANCE) 25 MG TABS tablet Take 25 mg by mouth daily.    [provider]  ezetimibe (ZETIA) 10 MG tablet Take 10 mg by mouth daily.  01/05/17   [provider]  fenofibrate micronized (LOFIBRA) 134 MG capsule Take 134 mg by mouth daily.    [provider]  fluticasone (FLONASE) 50 MCG/ACT nasal spray Place 2 sprays into both nostrils daily. 01/26/20   Sowles,  Drue Stager, MD  gabapentin (NEURONTIN) 300 MG capsule Take 1 tablet each morning, 1 tablet each afternoon, and 2 tablets at bedtime. Patient taking differently: Take 300-600 mg by mouth 3 (three) times daily. Take 1 capsule each morning, 1 capsule each afternoon, and 2 capsules at bedtime. 08/12/19   Hubbard Hartshorn, FNP  HUMULIN R U-500 KWIKPEN 500 UNIT/ML kwikpen Inject 70 Units as directed 3 (three) times daily.     [provider]  insulin aspart (NOVOLOG) 100 UNIT/ML injection Inject 5-25 Units into the skin 3 (three) times daily before meals.     [provider]  lamoTRIgine (LAMICTAL) 200 MG tablet Take 200 mg by mouth 2 (two) times daily.     [provider]  levothyroxine (SYNTHROID) 50 MCG tablet TAKE ONE TABLET DAILY 3 DAYS A WEEK, THEN TAKE ONE AND ONE-HALF TABLETS FOUR DAYS A WEEK 12/23/19   Hubbard Hartshorn, FNP  lisinopril (ZESTRIL) 2.5 MG tablet TAKE 1 TABLET BY MOUTH EVERY DAY 01/06/20   Ancil Boozer, Drue Stager, MD  meclizine (ANTIVERT) 12.5 MG tablet Take 1 tablet (12.5 mg total)  by mouth 3 (three) times daily as needed for dizziness. 11/05/19   Towanda Malkin, MD  metoprolol tartrate (LOPRESSOR) 25 MG tablet TAKE 1 TABLET (25MG TOTAL) IN THE MORNING AND 1/2 TABLET IN THE EVENING Patient taking differently: Take 12.5-25 mg by mouth 2 (two) times daily. TAKE 1 TABLET (25MG TOTAL) IN THE MORNING AND 1/2 TABLET (12.5 mg) IN THE EVENING 08/19/19   Sowles, Drue Stager, MD  nitrofurantoin, macrocrystal-monohydrate, (MACROBID) 100 MG capsule Take 1 capsule (100 mg total) by mouth 2 (two) times daily for 7 days. 01/31/20 02/07/20  Jearld Fenton, NP  Omega-3 Fatty Acids (FISH OIL PO) Take by mouth daily.    [provider]  ondansetron (ZOFRAN) 4 MG tablet TAKE 1 TABLET BY MOUTH EVERY 8 HOURS AS NEEDED FOR NAUSEA OR VOMITING Patient taking differently: Take 4 mg by mouth every 8 (eight) hours as needed for nausea or vomiting.  09/15/19   [name], MD  [facility] VERIO test strip  08/18/19   [provider]  pantoprazole (PROTONIX) 40 MG tablet Take 1 tablet (40 mg total) by mouth daily. 08/20/19   [name], MD  polyethylene glycol (MIRALAX) 17 g packet Take 17 g by mouth daily. Patient taking differently: Take 17 g by mouth daily as needed for mild constipation.  10/27/19   Nance Pear, MD  QUEtiapine (SEROQUEL) 300 MG tablet Take 300 mg by mouth at bedtime. 11/18/19   [provider]  traMADol (ULTRAM) 50 MG tablet Take 50 mg by mouth every 6 (six) hours as needed. 08/12/19   [provider]  traZODone (DESYREL) 100 MG tablet Take 100 mg by mouth at bedtime.    [provider]  TRULICITY 1.5 ZO/1.0RU SOPN Inject 1.5 mLs as directed every Sunday.  03/26/18   [provider]  ULTICARE SHORT PEN NEEDLES 31G X 8 MM [place]  09/10/19   [provider]    Allergies Mirtazapine, Morphine and related, Sulfamethoxazole-trimethoprim, Amoxicillin er, Codeine, Hydrocodone-acetaminophen, Keflex [cephalexin], and  Prasterone  Family History  Problem Relation Age of Onset  . Diabetes Mother   . Heart disease Mother   . Hyperlipidemia Mother   . Hypertension Mother   . Kidney disease Mother   . Mental illness Mother   . Hypothyroidism Mother   . Stroke Mother   . Osteoporosis Mother   . Glaucoma Mother   . Congestive  Heart Failure Mother   . Hypertension Father   . Asthma Daughter   . Cancer Daughter        throat  . Bipolar disorder Daughter   . Heart disease Maternal Grandfather   . Rheum arthritis Maternal Grandfather   . Cancer Maternal Grandfather        liver  . Kidney disease Maternal Grandfather   . Cancer Paternal Grandmother        lung  . Kidney disease Brother   . Breast cancer Maternal Grandmother 60  . Bipolar disorder Daughter   . Hypertension Daughter   . Restless legs syndrome Daughter     Social History Social History   Tobacco Use  . Smoking status: Never Smoker  . Smokeless tobacco: Never Used  Substance Use Topics  . Alcohol use: No  . Drug use: No    Review of Systems  Constitutional: Negative for fever, chills or body aches. Cardiovascular: Negative for chest pain or chest tightness. Respiratory: Negative for cough or shortness of breath. Gastrointestinal: Positive for bladder pressure. Negative for abdominal pain, nausea, vomiting and diarrhea. Genitourinary: Positive for urgency, frequency, dysuria. Negative for blood in urine, vaginal discharge, odor, irritation or abnormal bleeding. Musculoskeletal: Negative for low back pain. Skin: Negative for rash. Neurological: Negative for headaches, focal weakness, tingling or numbness. ____________________________________________  PHYSICAL EXAM:  VITAL SIGNS: ED Triage Vitals  Enc Vitals Group     BP 01/31/20 1447 (!) 141/70     Pulse Rate 01/31/20 1445 95     Resp 01/31/20 1445 18     Temp 01/31/20 1445 99.3 F (37.4 C)     Temp Source 01/31/20 1445 Oral     SpO2 01/31/20 1445 93 %     Weight  01/31/20 1446 190 lb (86.2 kg)     Height 01/31/20 1446 4' 11"  (1.499 m)     Head Circumference --      Peak Flow --      Pain Score 01/31/20 1445 7     Pain Loc --      Pain Edu? --      Excl. in [place]? --     Constitutional: Alert and oriented. Obese, in no distress. Cardiovascular: Normal rate, regular rhythm.  Respiratory: Normal respiratory effort. No wheezes/rales/rhonchi. Gastrointestinal: Soft, mildly tender over the bladder. No distention. No CVA tenderness noted. Neurologic:  Normal gait without ataxia. Normal speech and language. No gross focal neurologic deficits are appreciated. Skin:  Skin is warm, dry and intact. No rash noted. ____________________________________________    LABS  Labs Reviewed  URINALYSIS, COMPLETE (UACMP) WITH MICROSCOPIC - Abnormal; Notable for the following components:      Result Value   Color, Urine STRAW (*)    APPearance HAZY (*)    Glucose, UA >=500 (*)    Hgb urine dipstick SMALL (*)    Leukocytes,Ua LARGE (*)    WBC, UA >50 (*)    Bacteria, UA RARE (*)    All other components within normal limits    ____________________________________________  INITIAL IMPRESSION / ASSESSMENT AND PLAN / ED COURSE  Urinary Urgency, Frequency, Dysuria, Pelvic Pressure:  DDX include interstitial cystitis, UTI Rocephin 1 gm IM x 1 RX for Macrobid 100 mg BID x 3 days Push fluids Ok to take AZO OTC ____________________________________________  FINAL CLINICAL IMPRESSION(S) / ED DIAGNOSES  Final diagnoses:  Acute cystitis without hematuria      Jearld Fenton, NP 01/31/20 1745    Earleen Newport,  MD 01/31/20 1831

## 2020-01-31 NOTE — Discharge Instructions (Addendum)
You were seen today for urinary symptoms and diagnosed with a UTI.  Have given you a prescription for antibiotics to take twice daily for the next 3 days.  Please take all the medication as directed.  Please increase your water intake.  You can take AZO OTC for the burning.  Follow-up with PCP if symptoms persist or worsen.

## 2020-02-05 ENCOUNTER — Telehealth: Payer: Self-pay

## 2020-02-05 ENCOUNTER — Institutional Professional Consult (permissible substitution): Payer: Medicare HMO | Admitting: Neurology

## 2020-02-05 ENCOUNTER — Encounter: Payer: Self-pay | Admitting: Neurology

## 2020-02-05 IMAGING — CT CT HEAD WO/W CM
3 of 4 series · 15 of 47 positions shown, 18 images · IV contrast (omnipaque)
Comparison: CT head 02/07/2018

CLINICAL DATA: Forehead swelling rule out infection.  Diabetic

EXAM:
CT HEAD WITHOUT AND WITH CONTRAST
TECHNIQUE: Contiguous axial images were obtained from the base of the skull
through the vertex without and with intravenous contrast
CONTRAST:  75mL OMNIPAQUE IOHEXOL 300 MG/ML  SOLN

[Series 3: head wo · axial · 0.40mm/px · z∈[+267,+417]mm · 9 of 36 slices shown, 12 images]
[im 3/36  brain]
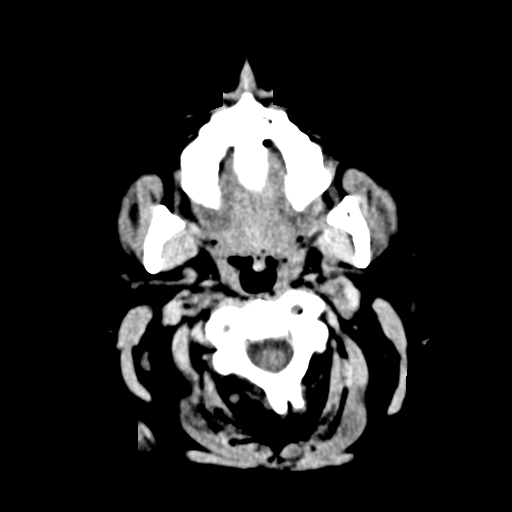
[im 3/36  bone]
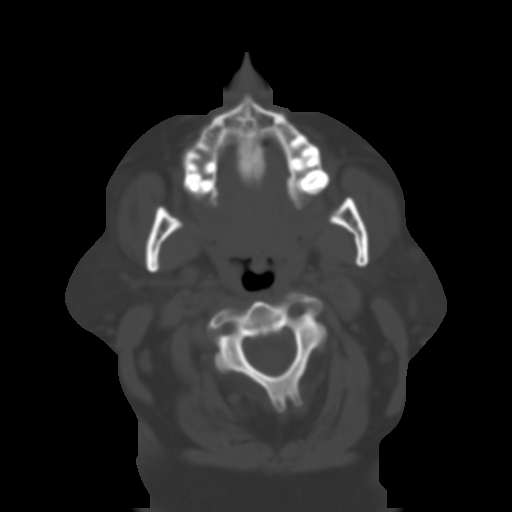
[im 8/36  brain]
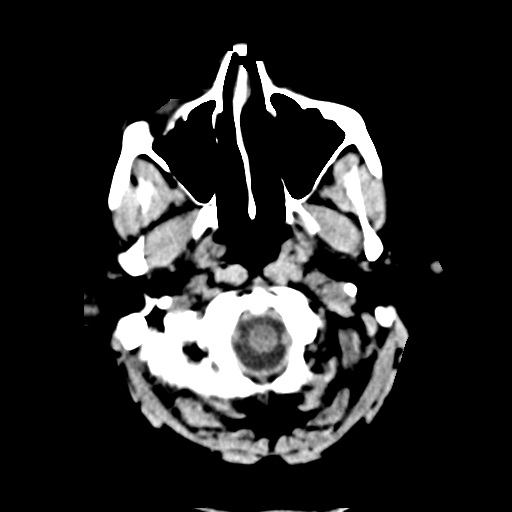
[im 11/36  brain]
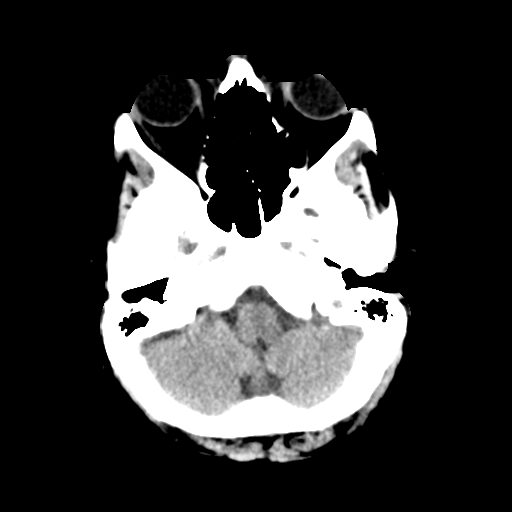
[im 16/36  brain]
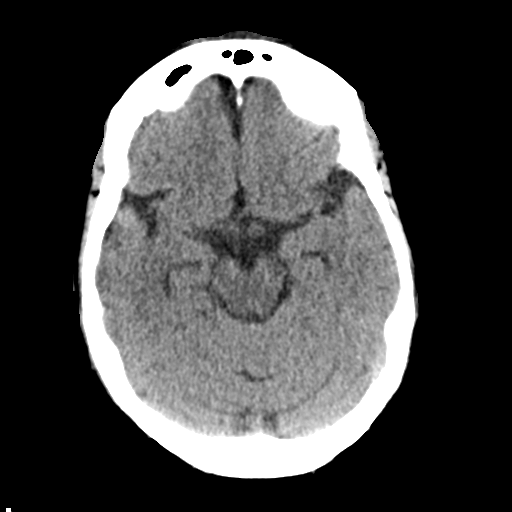
[im 18/36  brain]
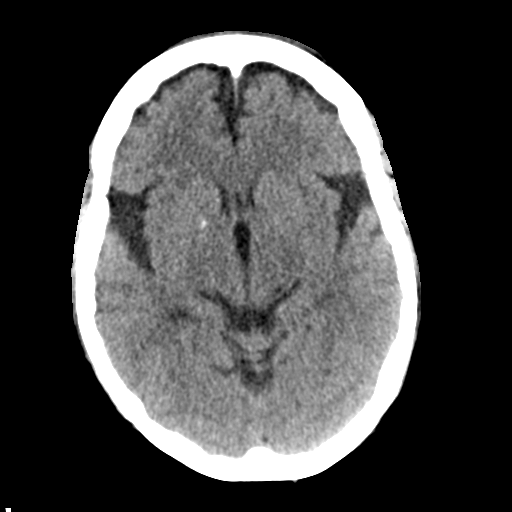
[im 18/36  bone]
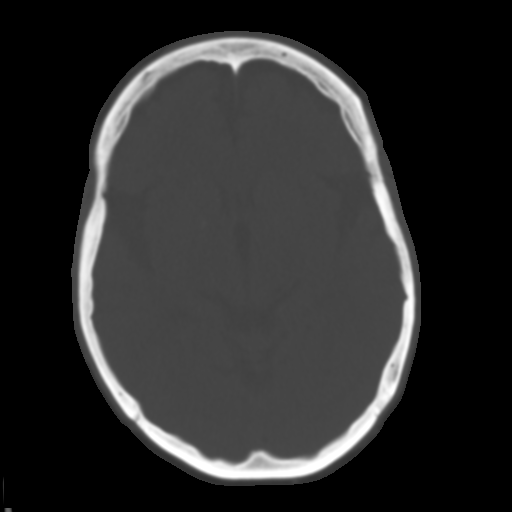
[im 21/36  brain]
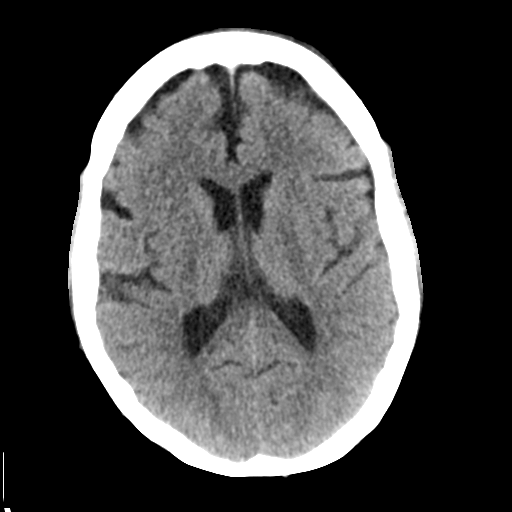
[im 26/36  brain]
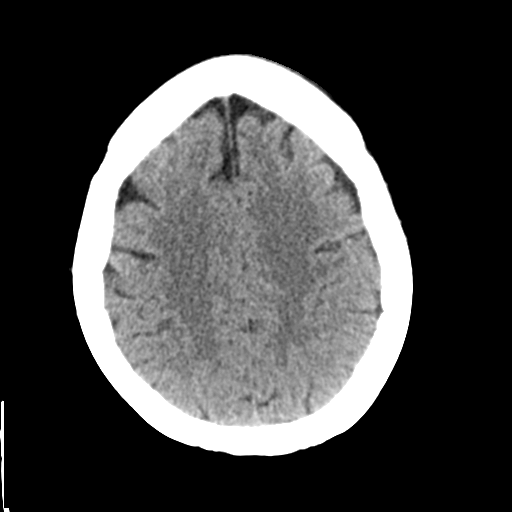
[im 28/36  brain]
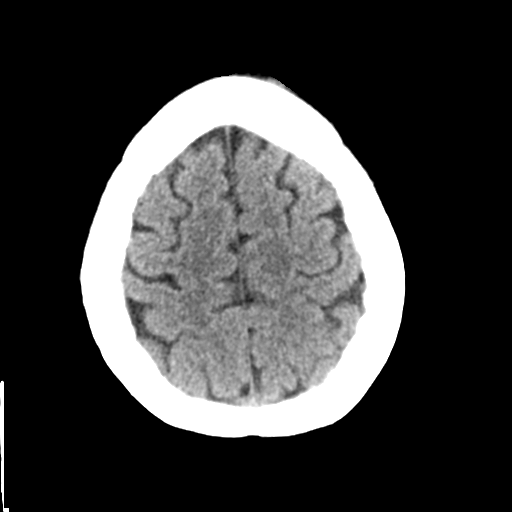
[im 33/36  brain]
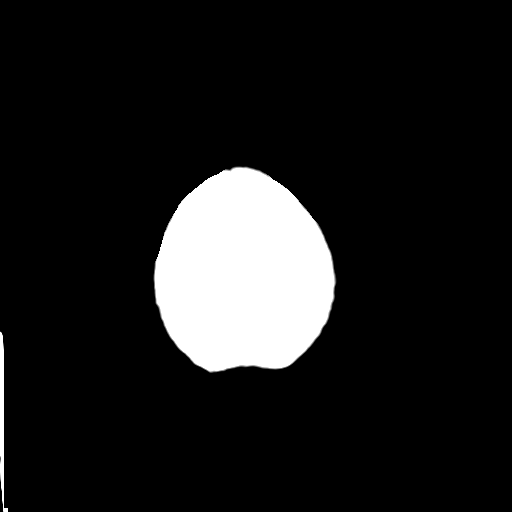
[im 33/36  bone]
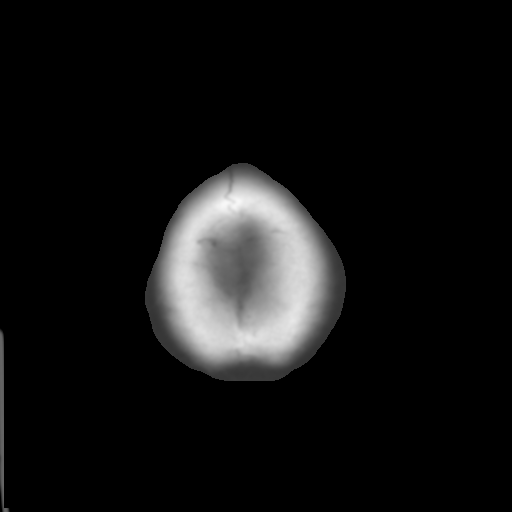

[Series 5: coronal soft tissue · coronal · 0.33mm/px · 3 of 68 slices shown]
[im 23/68  brain]
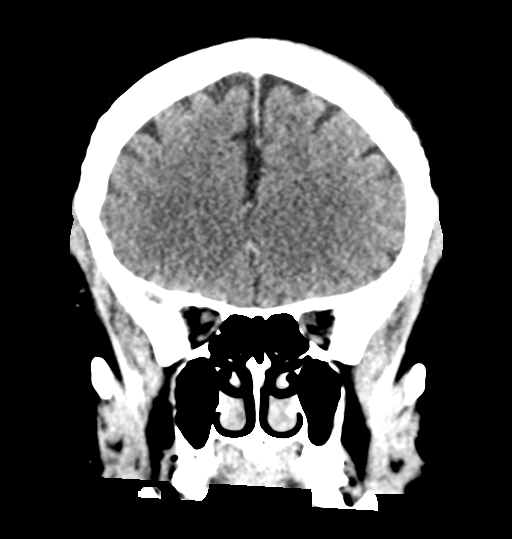
[im 30/68  brain]
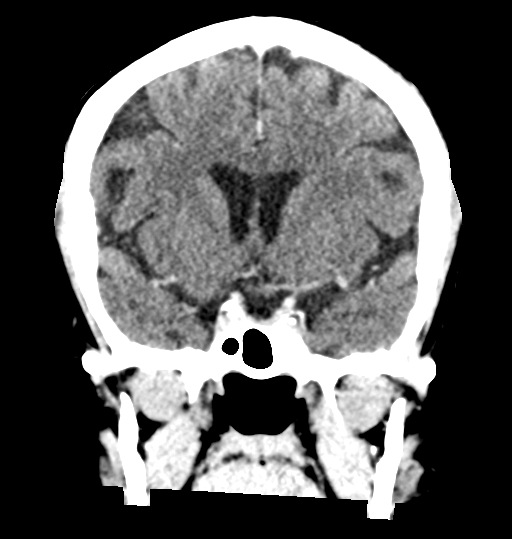
[im 38/68  brain]
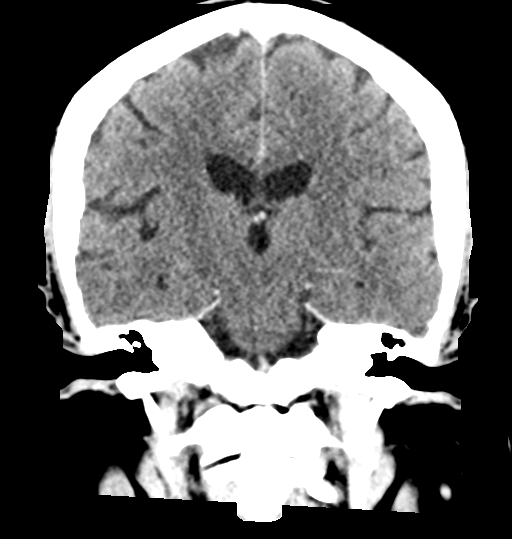

[Series 6: sagittal soft tissue · sagittal · 0.35mm/px · 3 of 53 slices shown]
[im 18/53  brain]
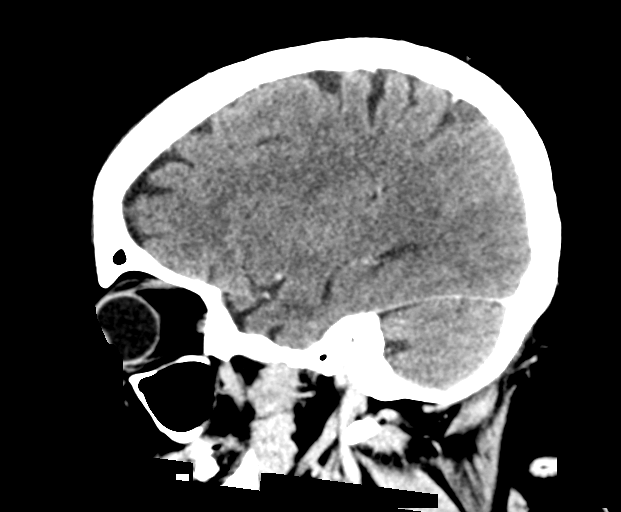
[im 27/53  brain]
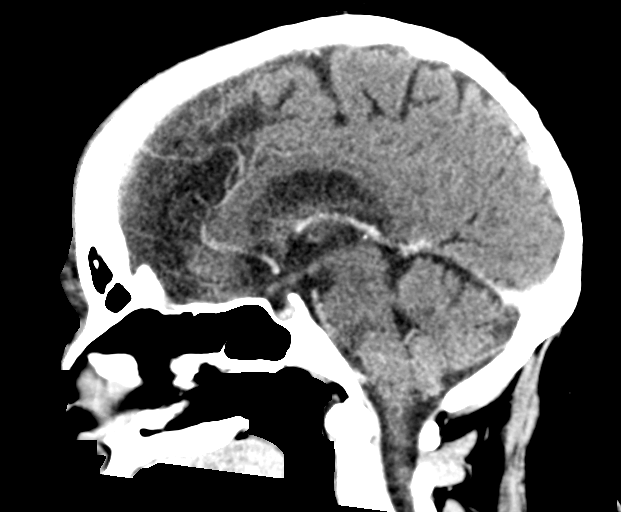
[im 35/53  brain]
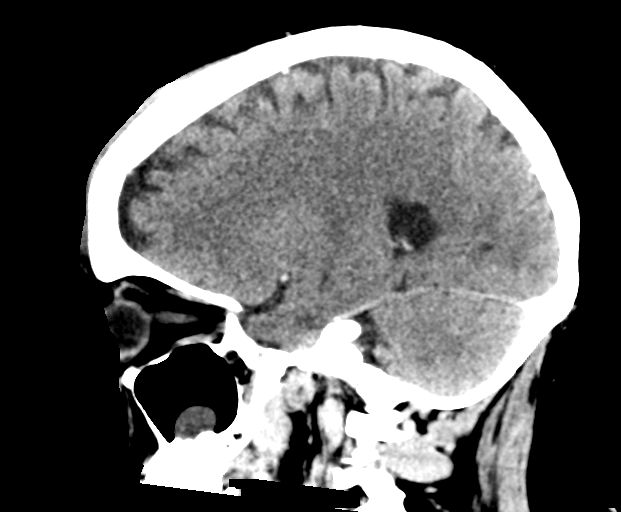

[15 of 47 positions shown; findings below may reference images not displayed]

FINDINGS: Brain: Mild atrophy. Negative for hydrocephalus. Negative for acute
infarct, hemorrhage, mass.

Postcontrast imaging demonstrates normal enhancement. No
intracranial mass or fluid collection.

Vascular: Negative for hyperdense vessel. Normal venous enhancement.

Skull: Negative for skull fracture.  No bony destruction identified.

Sinuses/Orbits: Mucous retention cysts in the maxillary sinus
bilaterally. Remaining sinuses clear. Frontal sinus clear.

Other: Diffuse soft tissue thickening of the anterior scalp just
above the nose and extending superiorly. No abscess is identified.
Streaky density is present in the subcutaneous fat without focal
mass.
IMPRESSION: No acute intracranial abnormality

Diffuse soft tissue thickening of the anterior scalp in the frontal
region. This may represent cellulitis without abscess. Hemorrhagic
scalp contusion also is a possibility.

Underlying sinuses in the frontal ethmoid region are clear.

## 2020-02-05 NOTE — Telephone Encounter (Signed)
Patient no show for new pt appt. Pt last seen 12/2014.

## 2020-02-07 IMAGING — CR DG CHEST 2V
2 series · 2 of 2 positions shown · non-contrast
Comparison: 02/07/2018

CLINICAL DATA: Hx of COPD, SOB started about 6am today. Non smoker.
short of breath

EXAM:
CHEST - 2 VIEW

[chest pa]
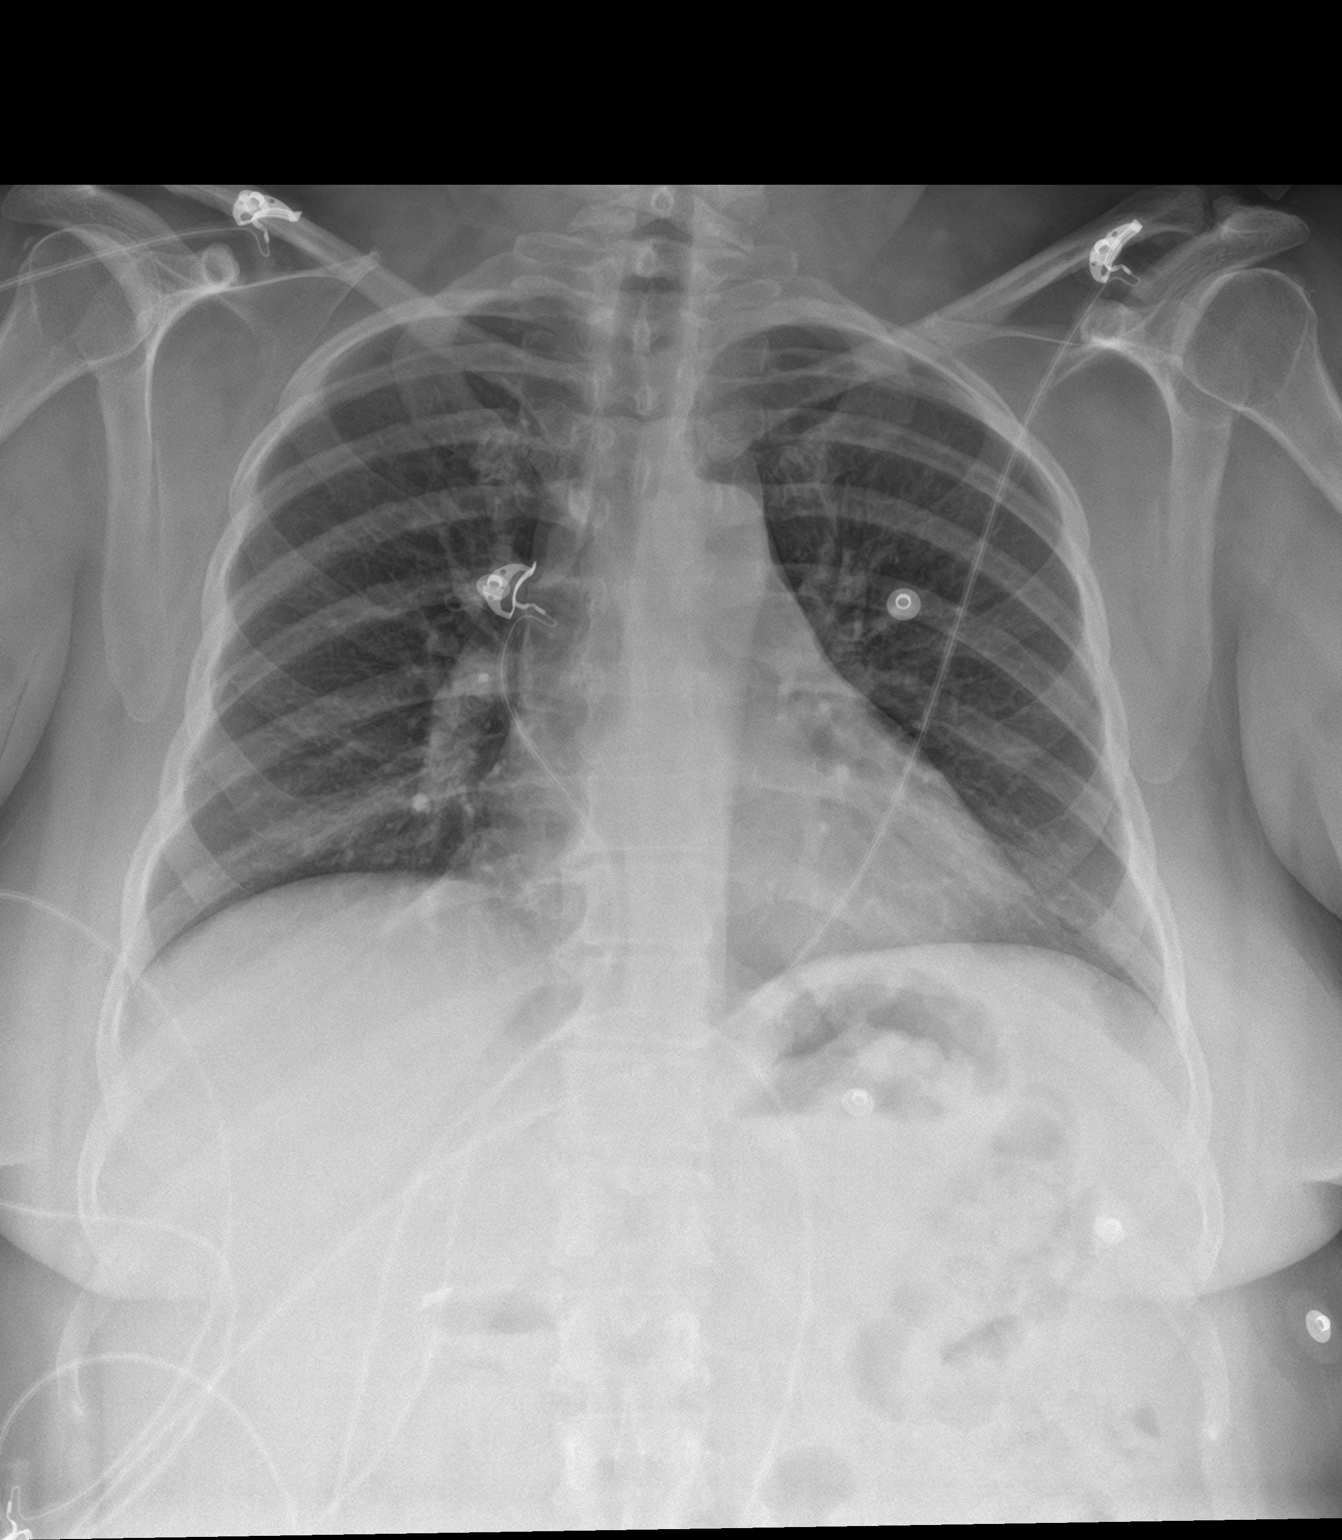

[chest lat]
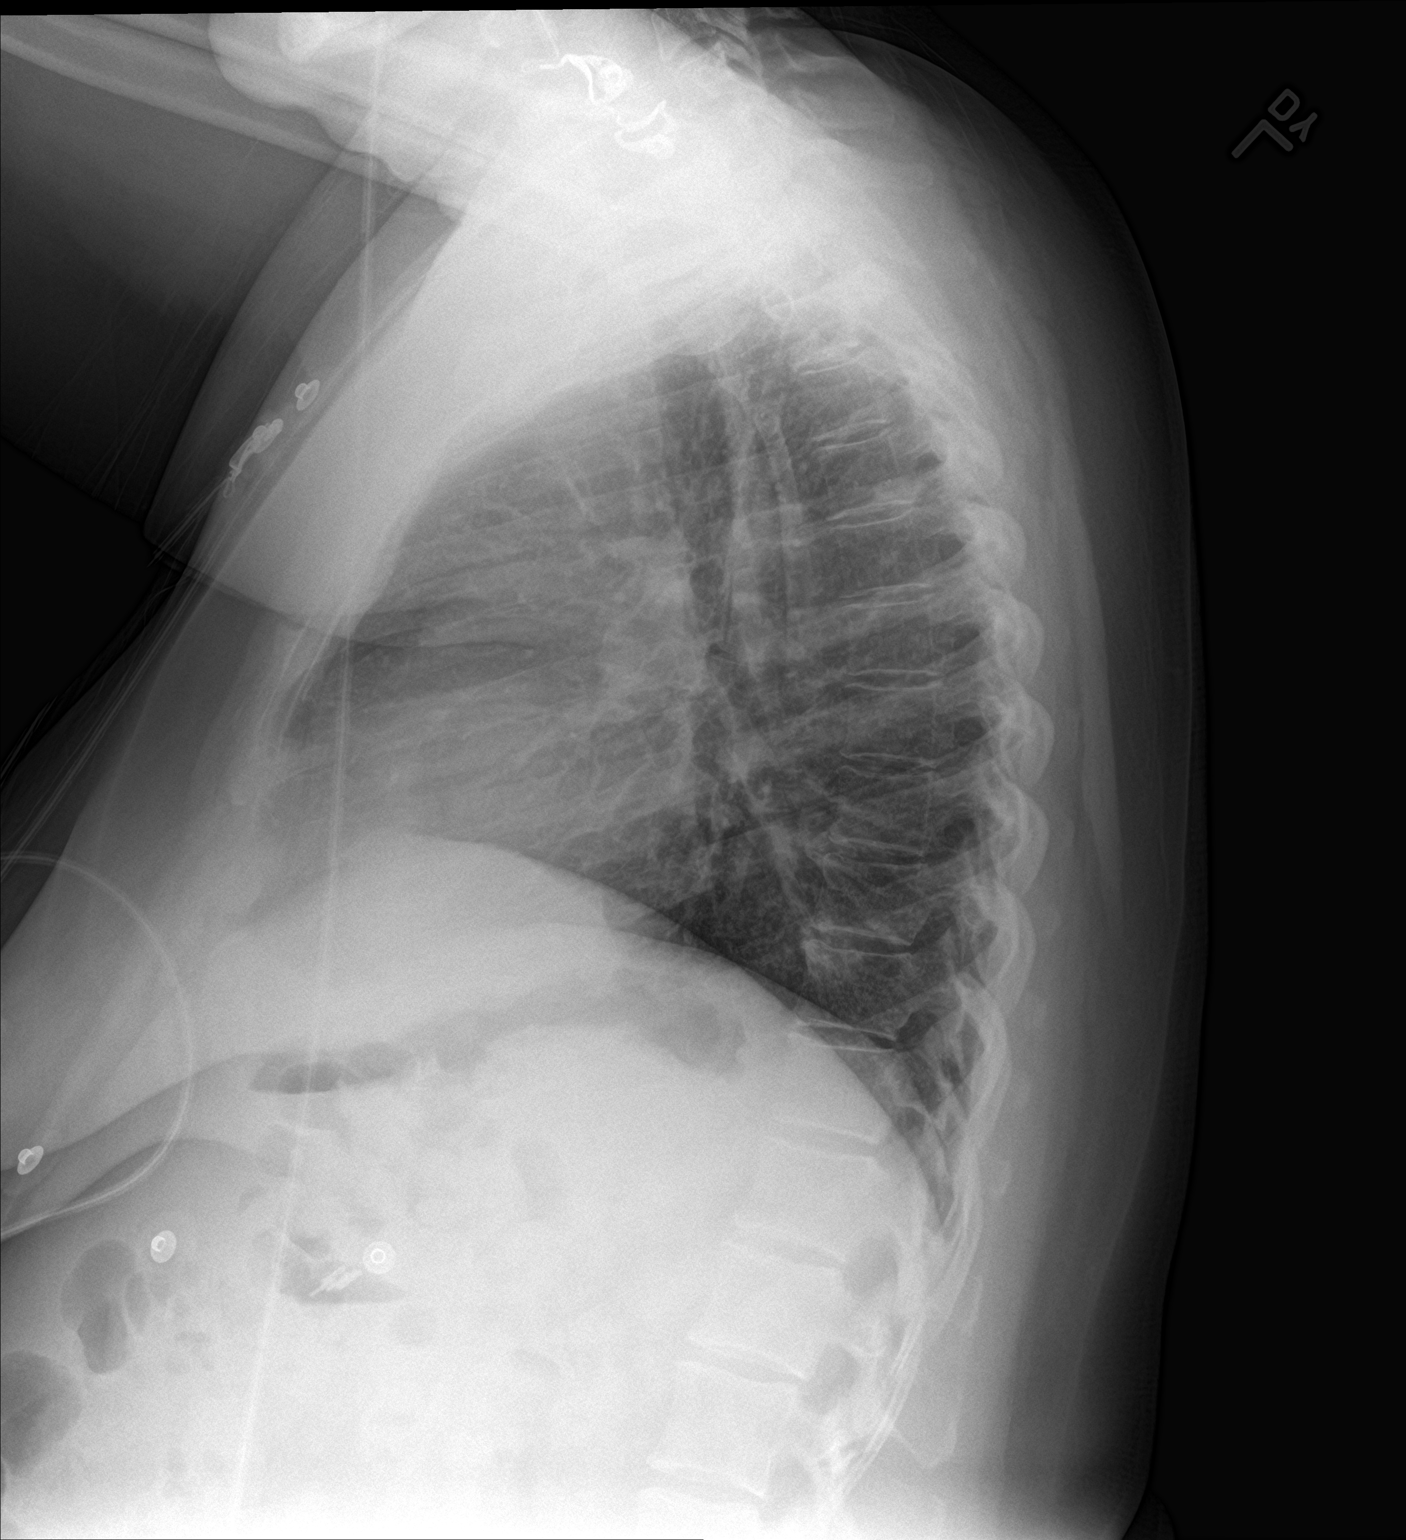

[2 of 2 positions shown; findings below may reference images not displayed]

FINDINGS: The heart size and mediastinal contours are within normal limits.
Both lungs are clear. The visualized skeletal structures are
unremarkable.
IMPRESSION: No acute cardiopulmonary process.

## 2020-02-18 ENCOUNTER — Encounter: Payer: Self-pay | Admitting: Family Medicine

## 2020-03-02 ENCOUNTER — Telehealth: Payer: Self-pay | Admitting: Family Medicine

## 2020-03-02 DIAGNOSIS — I10 Essential (primary) hypertension: Secondary | ICD-10-CM

## 2020-03-02 NOTE — Telephone Encounter (Signed)
Requested medication (s) are due for refill today:yes  Requested medication (s) are on the active medication list: yes  Last refill:  12/09/2019  Future visit scheduled: yes  Notes to clinic:  verify direction on script for refill   Requested Prescriptions  Pending Prescriptions Disp Refills   metoprolol tartrate (LOPRESSOR) 25 MG tablet [Pharmacy Med Name: METOPROLOL TARTRATE 25 MG TAB] 135 tablet 1    Sig: TAKE 1 TABLET (25MG TOTAL) IN THE MORNING AND 1/2 TABLET IN THE EVENING      Cardiovascular:  Beta Blockers Failed - 03/02/2020 10:05 AM      Failed - Last BP in normal range    BP Readings from Last 1 Encounters:  01/31/20 (!) 142/73          Passed - Last Heart Rate in normal range    Pulse Readings from Last 1 Encounters:  01/31/20 83          Passed - Valid encounter within last 6 months    Recent Outpatient Visits           1 month ago Viral upper respiratory tract infection   [facility] [place], Drue Stager, MD   2 months ago RLS (restless legs syndrome)   [facility] Steele Sizer, MD   3 months ago Dizzy spells   [place], MD   6 months ago HYPERTENSION, BENIGN ESSENTIAL   [place], FNP   9 months ago Contact dermatitis, unspecified contact dermatitis type, unspecified trigger   [place], NP       Future Appointments             In 7 months [facility]

## 2020-03-03 NOTE — Telephone Encounter (Signed)
lvm for scheduling °

## 2020-03-17 ENCOUNTER — Encounter: Payer: Self-pay | Admitting: Family Medicine

## 2020-03-17 ENCOUNTER — Other Ambulatory Visit: Payer: Self-pay

## 2020-03-17 DIAGNOSIS — R202 Paresthesia of skin: Secondary | ICD-10-CM

## 2020-03-17 DIAGNOSIS — G2581 Restless legs syndrome: Secondary | ICD-10-CM

## 2020-03-17 DIAGNOSIS — E1140 Type 2 diabetes mellitus with diabetic neuropathy, unspecified: Secondary | ICD-10-CM

## 2020-03-17 DIAGNOSIS — IMO0002 Reserved for concepts with insufficient information to code with codable children: Secondary | ICD-10-CM

## 2020-03-17 MED ORDER — GABAPENTIN 300 MG PO CAPS: 300.0000 mg | ORAL_CAPSULE | Freq: Three times a day (TID) | ORAL | 0 refills | Status: DC

## 2020-03-27 IMAGING — NM NM GASTRIC EMPTYING
1 series · 10 of 10 positions shown · non-contrast
Comparison: CT 07/14/2017

CLINICAL DATA: Nausea and vomiting

EXAM:
NUCLEAR MEDICINE GASTRIC EMPTYING SCAN
TECHNIQUE: After oral ingestion of radiolabeled meal, sequential abdominal
images were obtained for 4 hours. Percentage of activity emptying
the stomach was calculated at 1 hour, 2 hour, 3 hour, and 4 hours.
RADIOPHARMACEUTICALS:  2.4 mCi 1c-ZZm sulfur colloid in standardized
meal

[Series 1000: gatric statics (results) · 3.90mm/px · 5 acquisitions, 10 frames shown]
[im 1/5]
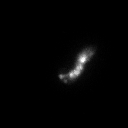
[im 1/5]
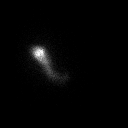
[im 2/5]
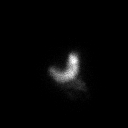
[im 2/5]
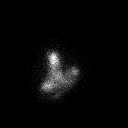
[im 3/5]
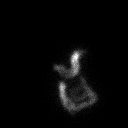
[im 3/5]
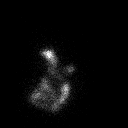
[im 4/5]
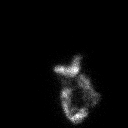
[im 4/5]
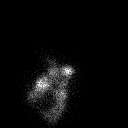
[im 5/5]
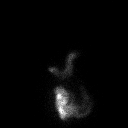
[im 5/5]
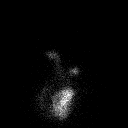

[10 of 10 positions shown; findings below may reference images not displayed]

FINDINGS: Expected location of the stomach in the left upper quadrant.
Ingested meal empties the stomach gradually over the course of the
study.

15% emptied at 1 hr ( normal >= 10%)

60% emptied at 2 hr ( normal >= 40%)

67% emptied at 3 hr ( normal >= 70%)

83% emptied at 4 hr ( normal >= 90%)
IMPRESSION: Slightly delayed gastric emptying study.

## 2020-03-30 ENCOUNTER — Other Ambulatory Visit: Payer: Self-pay

## 2020-03-30 DIAGNOSIS — E038 Other specified hypothyroidism: Secondary | ICD-10-CM

## 2020-03-31 ENCOUNTER — Encounter: Payer: Self-pay | Admitting: Emergency Medicine

## 2020-03-31 ENCOUNTER — Other Ambulatory Visit: Payer: Self-pay

## 2020-03-31 ENCOUNTER — Emergency Department
Admission: EM | Admit: 2020-03-31 | Discharge: 2020-04-01 | Disposition: A | Discharge status: Other Acute Inpatient Hospital | Payer: Medicare HMO | Attending: Emergency Medicine | Admitting: Emergency Medicine

## 2020-03-31 DIAGNOSIS — Z7951 Long term (current) use of inhaled steroids: Secondary | ICD-10-CM | POA: Diagnosis not present

## 2020-03-31 DIAGNOSIS — I509 Heart failure, unspecified: Secondary | ICD-10-CM | POA: Insufficient documentation

## 2020-03-31 DIAGNOSIS — F250 Schizoaffective disorder, bipolar type: Secondary | ICD-10-CM | POA: Diagnosis present

## 2020-03-31 DIAGNOSIS — N183 Chronic kidney disease, stage 3 unspecified: Secondary | ICD-10-CM | POA: Diagnosis not present

## 2020-03-31 DIAGNOSIS — J45909 Unspecified asthma, uncomplicated: Secondary | ICD-10-CM | POA: Insufficient documentation

## 2020-03-31 DIAGNOSIS — Z79890 Hormone replacement therapy (postmenopausal): Secondary | ICD-10-CM | POA: Diagnosis not present

## 2020-03-31 DIAGNOSIS — I130 Hypertensive heart and chronic kidney disease with heart failure and stage 1 through stage 4 chronic kidney disease, or unspecified chronic kidney disease: Secondary | ICD-10-CM | POA: Diagnosis not present

## 2020-03-31 DIAGNOSIS — Z794 Long term (current) use of insulin: Secondary | ICD-10-CM | POA: Insufficient documentation

## 2020-03-31 DIAGNOSIS — R739 Hyperglycemia, unspecified: Secondary | ICD-10-CM

## 2020-03-31 DIAGNOSIS — I2510 Atherosclerotic heart disease of native coronary artery without angina pectoris: Secondary | ICD-10-CM | POA: Insufficient documentation

## 2020-03-31 DIAGNOSIS — E039 Hypothyroidism, unspecified: Secondary | ICD-10-CM | POA: Diagnosis not present

## 2020-03-31 DIAGNOSIS — E1140 Type 2 diabetes mellitus with diabetic neuropathy, unspecified: Secondary | ICD-10-CM | POA: Diagnosis not present

## 2020-03-31 DIAGNOSIS — F329 Major depressive disorder, single episode, unspecified: Secondary | ICD-10-CM | POA: Diagnosis not present

## 2020-03-31 DIAGNOSIS — F419 Anxiety disorder, unspecified: Secondary | ICD-10-CM | POA: Insufficient documentation

## 2020-03-31 DIAGNOSIS — Z20822 Contact with and (suspected) exposure to covid-19: Secondary | ICD-10-CM | POA: Insufficient documentation

## 2020-03-31 DIAGNOSIS — Z79899 Other long term (current) drug therapy: Secondary | ICD-10-CM | POA: Insufficient documentation

## 2020-03-31 DIAGNOSIS — J449 Chronic obstructive pulmonary disease, unspecified: Secondary | ICD-10-CM | POA: Diagnosis not present

## 2020-03-31 DIAGNOSIS — F319 Bipolar disorder, unspecified: Secondary | ICD-10-CM | POA: Diagnosis present

## 2020-03-31 DIAGNOSIS — E1165 Type 2 diabetes mellitus with hyperglycemia: Secondary | ICD-10-CM | POA: Diagnosis not present

## 2020-03-31 DIAGNOSIS — R45851 Suicidal ideations: Secondary | ICD-10-CM | POA: Diagnosis not present

## 2020-03-31 DIAGNOSIS — R5383 Other fatigue: Secondary | ICD-10-CM | POA: Diagnosis present

## 2020-03-31 LAB — CBC
HCT: 36.2 % (ref 36.0–46.0)
Hemoglobin: 11.1 g/dL — ABNORMAL LOW (ref 12.0–15.0)
MCH: 29 pg (ref 26.0–34.0)
MCHC: 30.7 g/dL (ref 30.0–36.0)
MCV: 94.5 fL (ref 80.0–100.0)
Platelets: 330 10*3/uL (ref 150–400)
RBC: 3.83 MIL/uL — ABNORMAL LOW (ref 3.87–5.11)
RDW: 14.3 % (ref 11.5–15.5)
WBC: 8.6 10*3/uL (ref 4.0–10.5)
nRBC: 0 % (ref 0.0–0.2)

## 2020-03-31 LAB — COMPREHENSIVE METABOLIC PANEL
ALT: 39 U/L (ref 0–44)
AST: 40 U/L (ref 15–41)
Albumin: 3.8 g/dL (ref 3.5–5.0)
Alkaline Phosphatase: 54 U/L (ref 38–126)
Anion gap: 11 (ref 5–15)
BUN: 28 mg/dL — ABNORMAL HIGH (ref 6–20)
CO2: 24 mmol/L (ref 22–32)
Calcium: 8.8 mg/dL — ABNORMAL LOW (ref 8.9–10.3)
Chloride: 106 mmol/L (ref 98–111)
Creatinine, Ser: 1.31 mg/dL — ABNORMAL HIGH (ref 0.44–1.00)
GFR calc Af Amer: 53 mL/min — ABNORMAL LOW (ref >60)
GFR calc non Af Amer: 45 mL/min — ABNORMAL LOW (ref >60)
Glucose, Bld: 251 mg/dL — ABNORMAL HIGH (ref 70–99)
Potassium: 3.8 mmol/L (ref 3.5–5.1)
Sodium: 141 mmol/L (ref 135–145)
Total Bilirubin: 0.6 mg/dL (ref 0.3–1.2)
Total Protein: 6.5 g/dL (ref 6.5–8.1)

## 2020-03-31 LAB — URINE DRUG SCREEN, QUALITATIVE (ARMC ONLY)
Amphetamines, Ur Screen: NOT DETECTED
Barbiturates, Ur Screen: NOT DETECTED
Benzodiazepine, Ur Scrn: NOT DETECTED
Cannabinoid 50 Ng, Ur ~~LOC~~: NOT DETECTED
Cocaine Metabolite,Ur ~~LOC~~: NOT DETECTED
MDMA (Ecstasy)Ur Screen: NOT DETECTED
Methadone Scn, Ur: NOT DETECTED
Opiate, Ur Screen: NOT DETECTED
Phencyclidine (PCP) Ur S: NOT DETECTED
Tricyclic, Ur Screen: NOT DETECTED

## 2020-03-31 LAB — ACETAMINOPHEN LEVEL: Acetaminophen (Tylenol), Serum: <10 ug/mL — ABNORMAL LOW (ref 10–30)

## 2020-03-31 LAB — SALICYLATE LEVEL: Salicylate Lvl: <7 mg/dL — ABNORMAL LOW (ref 7.0–30.0)

## 2020-03-31 LAB — ETHANOL: Alcohol, Ethyl (B): <10 mg/dL

## 2020-03-31 LAB — GLUCOSE, CAPILLARY: Glucose-Capillary: 244 mg/dL — ABNORMAL HIGH (ref 70–99)

## 2020-03-31 MED ORDER — EZETIMIBE 10 MG PO TABS
10.0000 mg | ORAL_TABLET | Freq: Every day | ORAL | Status: DC
  Administered 2020-03-31: 10 mg via ORAL
  Filled 2020-03-31 (×2): qty 1

## 2020-03-31 MED ORDER — TRAZODONE HCL 100 MG PO TABS
100.0000 mg | ORAL_TABLET | Freq: Every day | ORAL | Status: DC
  Administered 2020-03-31: 100 mg via ORAL
  Filled 2020-03-31: qty 1

## 2020-03-31 MED ORDER — INSULIN ASPART 100 UNIT/ML ~~LOC~~ SOLN
0.0000 [IU] | Freq: Three times a day (TID) | SUBCUTANEOUS | Status: DC
  Administered 2020-03-31 – 2020-04-01 (×2): 5 [IU] via SUBCUTANEOUS
  Filled 2020-03-31: qty 1

## 2020-03-31 MED ORDER — ATORVASTATIN CALCIUM 20 MG PO TABS
80.0000 mg | ORAL_TABLET | Freq: Every day | ORAL | Status: DC
  Administered 2020-03-31: 80 mg via ORAL
  Filled 2020-03-31: qty 4

## 2020-03-31 MED ORDER — LORAZEPAM 0.5 MG PO TABS
0.5000 mg | ORAL_TABLET | Freq: Once | ORAL | Status: AC
  Administered 2020-03-31: 0.5 mg via ORAL
  Filled 2020-03-31: qty 1

## 2020-03-31 MED ORDER — ACETAMINOPHEN 500 MG PO TABS
1000.0000 mg | ORAL_TABLET | Freq: Four times a day (QID) | ORAL | Status: DC | PRN
  Administered 2020-03-31 – 2020-04-01 (×2): 1000 mg via ORAL
  Filled 2020-03-31 (×3): qty 2

## 2020-03-31 MED ORDER — PANTOPRAZOLE SODIUM 40 MG PO TBEC
40.0000 mg | DELAYED_RELEASE_TABLET | Freq: Every day | ORAL | Status: DC
  Administered 2020-03-31: 40 mg via ORAL

## 2020-03-31 MED ORDER — LISINOPRIL 5 MG PO TABS
2.5000 mg | ORAL_TABLET | Freq: Every day | ORAL | Status: DC
  Administered 2020-03-31: 2.5 mg via ORAL

## 2020-03-31 NOTE — ED Triage Notes (Signed)
Pt presents to ED via POV with c/o SI. Pt states recent changes in PCP, was told her PCP couldn't see her anymore. Pt states unsure if PCP changed her medication prior to D/C'in patient from practice. Pt states multiple stressors at home. Pt states +SI at this time, denies plan. Pt noted to be intermittently tearful in triage .

## 2020-03-31 NOTE — ED Provider Notes (Signed)
-----------------------------------------   11:01 PM on 03/31/2020 -----------------------------------------  Patient medically cleared and awaiting psych disposition.  The patient has been placed in psychiatric observation due to the need to provide a safe environment for the patient while obtaining psychiatric consultation and evaluation, as well as ongoing medical and medication management to treat the patient's condition.  The patient has been placed under full IVC at this time.    Hinda Kehr, MD 03/31/20 [phone]

## 2020-03-31 NOTE — ED Notes (Signed)
Pt refused sandwich tray but requested/given diet shasta lime at this time. No other needs voiced; will continue to monitor Q15 minute rounds.

## 2020-03-31 NOTE — BH Assessment (Signed)
Referral information for Psychiatric Hospitalization faxed to;   [facility] [phone]),   . [facility] [phone]),  . [facility] [phone]),   . [facility] [phone]- [phone]),

## 2020-03-31 NOTE — ED Provider Notes (Signed)
.,  [facility] Provider Note  ____________________________________________   First MD Initiated Contact with Patient 03/31/20 1727     (approximate)  I have reviewed the triage vital signs and the nursing notes.   HISTORY  Chief Complaint Psychiatric Evaluation   HPI [name] is a 54 y.o. female the past medical history of anxiety, asthma, bipolar disorder, CHF, COPD, depression, thyroidism, HTN, HDL, and reflux who presents for assessment of suicidal thoughts with a plan to overdose on pills and alcohol after she was recently discharged from her psychiatrist because she was told she needs to see a psychiatrist to consult by therapy services.  Patient states she is also been under a lot of stress because of family moving out of her home and that she has not slept in 3 to 4 days.  She states she has been taking her medications denies illegal drug use, EtOH abuse, or tobacco abuse auscultates.  States he is otherwise been feeling physically okay without any recent fevers, chills, cough, vomiting, diarrhea, dysuria, rash, recent traumatic injuries, or other acute physical complaints.  Denies HI but does endorse subtle visual hallucinations stating she sometimes thinks she sees people in the periphery of her vision.  She has been having the symptoms for at least several weeks but have been progressive.  No other clear alleviating or aggravating factors.         Past Medical History:  Diagnosis Date  . Anxiety   . Arthritis   . Asthma   . Bipolar depression ([place])   . CHF (congestive heart failure) ([place])   . Chronic headaches    migraines have lessened  . Chronic kidney disease    ddstage 111  . COPD (chronic obstructive pulmonary disease) ([place])   . Coronary artery disease   . Depression   . Diabetic neuropathy ([place])   . Dyspnea    uses inhalers for this  . Dysrhythmia    rapid  . Fatty liver   . GERD (gastroesophageal reflux  disease)   . Headache   . Heart attack ([place]) 2009  . Hyperlipidemia   . Hypertension   . Hypomagnesemia   . Hypothyroid   . Irregular heart rhythm   . Liver lesion    Favored to be benign on CT; MRI of abd w/contract in Aug 2016. Liver Biopsy-Negative, March 2016  . Morbid obesity ([place])   . OSA (obstructive sleep apnea)   . Peripheral vascular disease ([place])   . Pinched nerve    Back  . Pneumonia   . RLS (restless legs syndrome)   . Seasonal allergies   . Sleep apnea    uses oxygen at night 2L d/t cpap did not fit  . Type 2 diabetes, uncontrolled, with neuropathy ([place]) 10/15/2015   Overview:  Microalbumin 7.7 12/2010: Hgb A1c 10.2% 12/2010 and 9.7% 03/2011, eye exam 10/2009 [facility] clinic   . Urinary incontinence   . Vitamin D deficiency     Patient Active Problem List   Diagnosis Date Noted  . Abscess of axilla, right 04/16/2019  . Iron deficiency anemia due to chronic blood loss   . Polyp of ascending colon   . Chronic gastric ulcer with hemorrhage   . Type 2 diabetes with decreased circulation ([place]) 11/07/2018  . Vitamin B12 deficiency 08/08/2018  . Encounter for Medicare annual wellness exam 10/30/2017  . Patient is full code 10/30/2017  . Disorder of bursae of shoulder region 10/09/2017  . Coronary  artery disease 10/09/2017  . Plantar fascial fibromatosis 10/09/2017  . Tendinitis of wrist 10/09/2017  . Schizoaffective disorder, bipolar type ([place]) 09/28/2017  . Chest pain 09/10/2017  . Pleuritic chest pain 05/27/2017  . Hypertriglyceridemia 12/21/2016  . Pansinusitis 11/15/2016  . 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 Breast cancer screening 09/28/2016  . Urine frequency 09/28/2016  . Preventative health care 09/28/2016  . Fatigue 05/01/2016  . Urinary tract infection 03/14/2016  . Adenomatous colon polyp 10/15/2015  . Bipolar affective disorder ([place]) 10/15/2015  . CN (constipation) 10/15/2015  . Drug-induced hepatic toxicity 10/15/2015  .  Blood in feces 10/15/2015  . HLD (hyperlipidemia) 10/15/2015  . Hypoxemia 10/15/2015  . NASH (nonalcoholic steatohepatitis) 10/15/2015  . Morbid obesity ([place]) 10/15/2015  . Restless leg syndrome 10/15/2015  . Type 2 diabetes, uncontrolled, with neuropathy ([place]) 10/15/2015  . Elevated alkaline phosphatase level 09/22/2015  . Urinary bladder incontinence 09/22/2015  . Low back pain with left-sided sciatica 08/18/2015  . Encounter for monitoring tricyclic antidepressant therapy 05/13/2015  . Bipolar disorder ([place]) 05/11/2015  . Apnea, sleep 05/05/2015  . Heart valve disease 05/05/2015  . Chronic kidney disease, stage III (moderate) 03/15/2015  . Migraine 03/12/2015  . COPD (chronic obstructive pulmonary disease) ([place]) 01/20/2015  . 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[place]) 01/20/2015  . GERD (gastroesophageal reflux disease) 01/20/2015  . Hypothyroid   . Vitamin D deficiency   . Urinary incontinence   . Liver lesion   . Hypomagnesemia   . Obstructive sleep apnea 10/20/2013  . Vaginitis 12/19/2011  . PERIPHERAL NEUROPATHY 03/04/2009  . HEMORRHOIDS, EXTERNAL 09/03/2008  . ASTHMA, PERSISTENT, MODERATE 07/04/2007  . INTERSTITIAL CYSTITIS 07/04/2007  . Allergic rhinitis 07/04/2007  . HYPERTENSION, BENIGN ESSENTIAL 06/19/2007    Past Surgical History:  Procedure Laterality Date  . ABDOMINAL HYSTERECTOMY     Partial  . bladder stim  2007  . BLADDER SURGERY     x 2. tacking of bladder and the other was the stimulator placement  . CHOLECYSTECTOMY    . COLONOSCOPY WITH PROPOFOL N/A 03/04/2019   Procedure: COLONOSCOPY WITH PROPOFOL;  Surgeon: [name], MD;  Location: [facility] ENDOSCOPY;  Service: Endoscopy;  Laterality: N/A;  . ESOPHAGOGASTRODUODENOSCOPY (EGD) WITH PROPOFOL N/A 03/04/2019   Procedure: ESOPHAGOGASTRODUODENOSCOPY (EGD) WITH PROPOFOL;  Surgeon: [name], MD;  Location: [facility] ENDOSCOPY;  Service: Endoscopy;  Laterality: N/A;  . INCISION AND DRAINAGE ABSCESS Right 04/16/2019    Procedure: INCISION AND DRAINAGE ABSCESS;  Surgeon: Benjamine Sprague, DO;  Location: [facility] ORS;  Service: General;  Laterality: Right;  . INTERSTIM-GENERATOR CHANGE N/A 12/31/2017   Procedure: INTERSTIM-GENERATOR CHANGE;  Surgeon: Bjorn Loser, MD;  Location: [facility] ORS;  Service: Urology;  Laterality: N/A;  . LIVER BIOPSY  March 2016   Negative  . LUNG BIOPSY  2015   small spot on lung  . TUBAL LIGATION    . UPPER GI ENDOSCOPY    . WRIST SURGERY Right 11/02/2019    Prior to Admission medications   Medication Sig Start Date End Date Taking? Authorizing Provider  albuterol (VENTOLIN HFA) 108 (90 Base) MCG/ACT inhaler Inhale 2 puffs into the lungs every 6 (six) hours as needed for wheezing or shortness of breath. 08/12/19   Hubbard Hartshorn, FNP  atorvastatin (LIPITOR) 80 MG tablet Take 80 mg by mouth at bedtime.     [provider]  benzonatate (TESSALON) 100 MG capsule Take 1-2 capsules (100-200 mg total) by mouth 2 (two) times daily as needed. 01/26/20  Steele Sizer, MD  busPIRone (BUSPAR) 10 MG tablet Take 20 mg by mouth 2 (two) times daily.     [provider]  citalopram (CELEXA) 20 MG tablet Take 20 mg by mouth daily. 01/16/20   [provider]  empagliflozin (JARDIANCE) 25 MG TABS tablet Take 25 mg by mouth daily.    [provider]  ezetimibe (ZETIA) 10 MG tablet Take 10 mg by mouth daily.  01/05/17   [provider]  fenofibrate micronized (LOFIBRA) 134 MG capsule Take 134 mg by mouth daily.    [provider]  fluticasone (FLONASE) 50 MCG/ACT nasal spray Place 2 sprays into both nostrils daily. 01/26/20   Steele Sizer, MD  gabapentin (NEURONTIN) 300 MG capsule Take 1-2 capsules (300-600 mg total) by mouth 3 (three) times daily. Take 1 capsule each morning, 1 capsule each afternoon, and 2 capsules at bedtime. 03/17/20   Steele Sizer, MD  HUMULIN R U-500 KWIKPEN 500 UNIT/ML kwikpen Inject 70 Units as directed 3 (three) times daily.      [provider]  insulin aspart (NOVOLOG) 100 UNIT/ML injection Inject 5-25 Units into the skin 3 (three) times daily before meals.     [provider]  lamoTRIgine (LAMICTAL) 200 MG tablet Take 200 mg by mouth 2 (two) times daily.     [provider]  levothyroxine (SYNTHROID) 50 MCG tablet TAKE ONE TABLET DAILY 3 DAYS A WEEK, THEN TAKE ONE AND ONE-HALF TABLETS FOUR DAYS A WEEK 12/23/19   Hubbard Hartshorn, FNP  lisinopril (ZESTRIL) 2.5 MG tablet TAKE 1 TABLET BY MOUTH EVERY DAY 01/06/20   Ancil Boozer, Drue Stager, MD  meclizine (ANTIVERT) 12.5 MG tablet Take 1 tablet (12.5 mg total) by mouth 3 (three) times daily as needed for dizziness. 11/05/19   Towanda Malkin, MD  metoprolol tartrate (LOPRESSOR) 25 MG tablet Take 0.5-1 tablets (12.5-25 mg total) by mouth 2 (two) times daily. TAKE 1 TABLET (25MG TOTAL) IN THE MORNING AND 1/2 TABLET (12.5 mg) IN THE EVENING 03/02/20   Sowles, Drue Stager, MD  Omega-3 Fatty Acids (FISH OIL PO) Take by mouth daily.    [provider]  ondansetron (ZOFRAN) 4 MG tablet TAKE 1 TABLET BY MOUTH EVERY 8 HOURS AS NEEDED FOR NAUSEA OR VOMITING Patient taking differently: Take 4 mg by mouth every 8 (eight) hours as needed for nausea or vomiting.  09/15/19   [name], MD  [facility] VERIO test strip  08/18/19   [provider]  pantoprazole (PROTONIX) 40 MG tablet Take 1 tablet (40 mg total) by mouth daily. 08/20/19   [name], MD  polyethylene glycol (MIRALAX) 17 g packet Take 17 g by mouth daily. Patient taking differently: Take 17 g by mouth daily as needed for mild constipation.  10/27/19   Nance Pear, MD  QUEtiapine (SEROQUEL) 300 MG tablet Take 300 mg by mouth at bedtime. 11/18/19   [provider]  traMADol (ULTRAM) 50 MG tablet Take 50 mg by mouth every 6 (six) hours as needed. 08/12/19   [provider]  traZODone (DESYREL) 100 MG tablet Take 100 mg by mouth at bedtime.    [provider]    TRULICITY 1.5 YS/0.6TK SOPN Inject 1.5 mLs as directed every Sunday.  03/26/18   [provider]  ULTICARE SHORT PEN NEEDLES 31G X 8 MM [place]  09/10/19   [provider]    Allergies Mirtazapine, Morphine and related, Sulfamethoxazole-trimethoprim, Amoxicillin er, Codeine, Hydrocodone-acetaminophen, Keflex [cephalexin], and Prasterone  Family History  Problem Relation Age of Onset  . Diabetes Mother   . Heart disease Mother   . Hyperlipidemia Mother   . Hypertension Mother   . Kidney disease Mother   . Mental illness Mother   . Hypothyroidism Mother   . Stroke Mother   . Osteoporosis Mother   . Glaucoma Mother   . Congestive Heart Failure Mother   . Hypertension Father   . Asthma Daughter   . Cancer Daughter        throat  . Bipolar disorder Daughter   . Heart disease Maternal Grandfather   . Rheum arthritis Maternal Grandfather   . Cancer Maternal Grandfather        liver  . Kidney disease Maternal Grandfather   . Cancer Paternal Grandmother        lung  . Kidney disease Brother   . Breast cancer Maternal Grandmother 60  . Bipolar disorder Daughter   . Hypertension Daughter   . Restless legs syndrome Daughter     Social History Social History   Tobacco Use  . Smoking status: Never Smoker  . Smokeless tobacco: Never Used  Vaping Use  . Vaping Use: Never used  Substance Use Topics  . Alcohol use: No  . Drug use: No    Review of Systems  Review of Systems  Constitutional: Negative for chills and fever.  HENT: Negative for sore throat.   Eyes: Negative for pain.  Respiratory: Negative for cough and stridor.   Cardiovascular: Negative for chest pain.  Gastrointestinal: Negative for vomiting.  Skin: Negative for rash.  Neurological: Negative for seizures, loss of consciousness and headaches.  Psychiatric/Behavioral: Positive for depression and suicidal ideas. The patient is nervous/anxious.   All other systems reviewed and are negative.      ____________________________________________   PHYSICAL EXAM:  VITAL SIGNS: ED Triage Vitals [03/31/20 1631]  Enc Vitals Group     BP (!) 123/105     Pulse Rate 92     Resp 22     Temp 98.9 F (37.2 C)     Temp Source Oral     SpO2 93 %     Weight 190 lb 0.6 oz (86.2 kg)     Height 4' 11"  (1.499 m)     Head Circumference      Peak Flow      Pain Score 0     Pain Loc      Pain Edu?      Excl. in [place]?    Vitals:   03/31/20 1631  BP: (!) 123/105  Pulse: 92  Resp: 22  Temp: 98.9 F (37.2 C)  SpO2: 93%   Physical Exam Vitals and nursing note reviewed.  Constitutional:      General: She is not in acute distress.    Appearance: She is well-developed.  HENT:     Head: Normocephalic and atraumatic.     Right Ear: External ear normal.     Left Ear: External ear normal.     Nose: Nose normal.     Mouth/Throat:     Mouth: Mucous membranes are moist.  Eyes:     Conjunctiva/sclera: Conjunctivae normal.  Cardiovascular:     Rate and Rhythm: Normal rate and regular rhythm.     Pulses: Normal pulses.     Heart sounds: No murmur heard.   Pulmonary:     Effort: Pulmonary effort is normal. No respiratory distress.     Breath sounds: Normal breath sounds.  Abdominal:  Palpations: Abdomen is soft.     Tenderness: There is no abdominal tenderness.  Musculoskeletal:     Cervical back: Neck supple.  Skin:    General: Skin is warm and dry.  Neurological:     Mental Status: She is alert and oriented to person, place, and time.  Psychiatric:        Mood and Affect: Mood is anxious and depressed. Affect is labile.        Thought Content: Thought content includes suicidal ideation. Thought content does not include homicidal ideation. Thought content includes suicidal plan. Thought content does not include homicidal plan.      ____________________________________________   LABS (all labs ordered are listed, but only abnormal results are displayed)  Labs Reviewed   COMPREHENSIVE METABOLIC PANEL - Abnormal; Notable for the following components:      Result Value   Glucose, Bld 251 (*)    BUN 28 (*)    Creatinine, Ser 1.31 (*)    Calcium 8.8 (*)    GFR calc non Af Amer 45 (*)    GFR calc Af Amer 53 (*)    All other components within normal limits  SALICYLATE LEVEL - Abnormal; Notable for the following components:   Salicylate Lvl <8.1 (*)    All other components within normal limits  ACETAMINOPHEN LEVEL - Abnormal; Notable for the following components:   Acetaminophen (Tylenol), Serum <10 (*)    All other components within normal limits  CBC - Abnormal; Notable for the following components:   RBC 3.83 (*)    Hemoglobin 11.1 (*)    All other components within normal limits  ETHANOL  URINE DRUG SCREEN, QUALITATIVE ([facility] ONLY)  HEMOGLOBIN A1C   ____________________________________________  ____________________________________________   PROCEDURES  Procedure(s) performed (including Critical Care):  Procedures   ____________________________________________   INITIAL IMPRESSION / ASSESSMENT AND PLAN / ED COURSE        Patient presents with a history of depression, anxiety, and bipolar disorder having not slept in several days and stating she is feeling suicidal with a plan to kill herself and overdose on pills.  She is also having some visual hallucinations but no auditory hallucinations and denies any acute physical complaints.  Patient is hemodynamically stable on arrival.  Exam as above.  On routine screening psychiatry labs patient is noted to be slightly hyperglycemic at 251 without evidence of acidosis otherwise no other significant arrangements.  Psychiatry service was consulted and patient was IVC due to concerns that she was in imminent danger to herself.  Medications  atorvastatin (LIPITOR) tablet 80 mg (has no administration in time range)  ezetimibe (ZETIA) tablet 10 mg (has no administration in time range)  lisinopril  (ZESTRIL) tablet 2.5 mg (has no administration in time range)  pantoprazole (PROTONIX) EC tablet 40 mg (has no administration in time range)  insulin aspart (novoLOG) injection 0-15 Units (has no administration in time range)  LORazepam (ATIVAN) tablet 0.5 mg (0.5 mg Oral Given 03/31/20 1850)         ____________________________________________   FINAL CLINICAL IMPRESSION(S) / ED DIAGNOSES  Final diagnoses:  Suicidal ideation  Hyperglycemia     ED Discharge Orders    None       Note:  This document was prepared using Dragon voice recognition software and may include unintentional dictation errors.   Lucrezia Starch, MD 03/31/20 [phone]

## 2020-03-31 NOTE — ED Notes (Signed)
Pt to this RN requesting to leave. EDP Z. Flora notified, to triage to assess patient due to patient being voluntary.

## 2020-03-31 NOTE — ED Notes (Signed)
This tech and [place], NT assisted pt into scrubs. Belongings include: Floral dress, gray sport bra, black sandals and blue underwear

## 2020-03-31 NOTE — BH Assessment (Signed)
Assessment Note  [name] is an 54 y.o. female presenting to [facility] ED initially voluntary has since been IVC'd. Per triage note Pt presents to ED via POV with c/o SI. Pt states recent changes in PCP, was told her PCP couldn't see her anymore. Pt states unsure if PCP changed her medication prior to D/C'in patient from practice. Pt states multiple stressors at home. Pt states +SI at this time, denies plan. Pt noted to be intermittently tearful in triage. During assessment patient is alert and oriented x4, calm and cooperative. Patient reported why she was presenting to the ED "I had a bad panic attack, my medicine isn't working right." "My doctor won't see me any more, she said that I need to go to a Psychiatrist." Patient reports passive SI thoughts "I think about it and then I think about it what it would do to my family." Patient reports suicide attempts in the past and reports a history of suicide attempts in her family. Patient reports 3 past suicide attempts "I tried to hang myself, I tried to overdose, and cut my wrist." Patient reports being hospitalized in the past for her suicide attempts at [facility]. Patient also reports lack of sleep, and overeating due to "boredom and depression." Patient denies HI/AH/VH and is not currently responding to any internal or external stimuli.  Per Psyc NP patient is recommended for Inpatient Hospitalization  Diagnosis: Major Depressive Disorder  Past Medical History:  Past Medical History:  Diagnosis Date  . Anxiety   . Arthritis   . Asthma   . Bipolar depression ([place])   . CHF (congestive heart failure) ([place])   . Chronic headaches    migraines have lessened  . Chronic kidney disease    ddstage 111  . COPD (chronic obstructive pulmonary disease) ([place])   . Coronary artery disease   . Depression   . Diabetic neuropathy ([place])   . Dyspnea    uses inhalers for this  . Dysrhythmia    rapid  . Fatty liver   . GERD (gastroesophageal reflux disease)    . Headache   . Heart attack ([place]) 2009  . Hyperlipidemia   . Hypertension   . Hypomagnesemia   . Hypothyroid   . Irregular heart rhythm   . Liver lesion    Favored to be benign on CT; MRI of abd w/contract in Aug 2016. Liver Biopsy-Negative, March 2016  . Morbid obesity ([place])   . OSA (obstructive sleep apnea)   . Peripheral vascular disease ([place])   . Pinched nerve    Back  . Pneumonia   . RLS (restless legs syndrome)   . Seasonal allergies   . Sleep apnea    uses oxygen at night 2L d/t cpap did not fit  . Type 2 diabetes, uncontrolled, with neuropathy ([place]) 10/15/2015   Overview:  Microalbumin 7.7 12/2010: Hgb A1c 10.2% 12/2010 and 9.7% 03/2011, eye exam 10/2009 [facility] clinic   . Urinary incontinence   . Vitamin D deficiency     Past Surgical History:  Procedure Laterality Date  . ABDOMINAL HYSTERECTOMY     Partial  . bladder stim  2007  . BLADDER SURGERY     x 2. tacking of bladder and the other was the stimulator placement  . CHOLECYSTECTOMY    . COLONOSCOPY WITH PROPOFOL N/A 03/04/2019   Procedure: COLONOSCOPY WITH PROPOFOL;  Surgeon: [name], MD;  Location: [facility] ENDOSCOPY;  Service: Endoscopy;  Laterality: N/A;  . ESOPHAGOGASTRODUODENOSCOPY (EGD) WITH PROPOFOL  N/A 03/04/2019   Procedure: ESOPHAGOGASTRODUODENOSCOPY (EGD) WITH PROPOFOL;  Surgeon: [name], MD;  Location: [facility] ENDOSCOPY;  Service: Endoscopy;  Laterality: N/A;  . INCISION AND DRAINAGE ABSCESS Right 04/16/2019   Procedure: INCISION AND DRAINAGE ABSCESS;  Surgeon: Benjamine Sprague, DO;  Location: [facility] ORS;  Service: General;  Laterality: Right;  . INTERSTIM-GENERATOR CHANGE N/A 12/31/2017   Procedure: INTERSTIM-GENERATOR CHANGE;  Surgeon: Bjorn Loser, MD;  Location: [facility] ORS;  Service: Urology;  Laterality: N/A;  . LIVER BIOPSY  March 2016   Negative  . LUNG BIOPSY  2015   small spot on lung  . TUBAL LIGATION    . UPPER GI ENDOSCOPY    . WRIST SURGERY Right 11/02/2019    Family History:  Family  History  Problem Relation Age of Onset  . Diabetes Mother   . Heart disease Mother   . Hyperlipidemia Mother   . Hypertension Mother   . Kidney disease Mother   . Mental illness Mother   . Hypothyroidism Mother   . Stroke Mother   . Osteoporosis Mother   . Glaucoma Mother   . Congestive Heart Failure Mother   . Hypertension Father   . Asthma Daughter   . Cancer Daughter        throat  . Bipolar disorder Daughter   . Heart disease Maternal Grandfather   . Rheum arthritis Maternal Grandfather   . Cancer Maternal Grandfather        liver  . Kidney disease Maternal Grandfather   . Cancer Paternal Grandmother        lung  . Kidney disease Brother   . Breast cancer Maternal Grandmother 60  . Bipolar disorder Daughter   . Hypertension Daughter   . Restless legs syndrome Daughter     Social History:  reports that she has never smoked. She has never used smokeless tobacco. She reports that she does not drink alcohol and does not use drugs.  Additional Social History:  Alcohol / Drug Use Pain Medications: See MAR Prescriptions: See MAR Over the Counter: See MAR History of alcohol / drug use?: No history of alcohol / drug abuse  CIWA: CIWA-Ar BP: (!) 137/60 Pulse Rate: 77 COWS:    Allergies:  Allergies  Allergen Reactions  . Mirtazapine Anaphylaxis    REACTION: closes throat  . Morphine And Related     anaphylaxis  . Sulfamethoxazole-Trimethoprim Rash    REACTION: Whelps all over  . Amoxicillin Er Nausea And Vomiting    Has patient had a PCN reaction causing immediate rash, facial/tongue/throat swelling, SOB or lightheadedness with hypotension: No Has patient had a PCN reaction causing severe rash involving mucus membranes or skin necrosis: No Has patient had a PCN reaction that required hospitalization: No Has patient had a PCN reaction occurring within the last 10 years: Yes If all of the above answers are "NO", then may proceed with Cephalosporin use.  . Codeine  Itching    REACTION: itch, rash  . Hydrocodone-Acetaminophen Rash    REACTION: rash  . Keflex [Cephalexin] Rash  . Prasterone Nausea And Vomiting    Home Medications: (Not in a hospital admission)   OB/GYN Status:  No LMP recorded. Patient has had a hysterectomy.  General Assessment Data Location of Assessment: [facility] ED TTS Assessment: In system Is this a Tele or Face-to-Face Assessment?: Face-to-Face Is this an Initial Assessment or a Re-assessment for this encounter?: Initial Assessment Patient Accompanied by:: N/A Language Other than English: No Living Arrangements: Other (Comment) What gender  do you identify as?: Female Marital status: Married Pregnancy Status: No Living Arrangements: Spouse/significant other Can pt return to current living arrangement?: Yes Admission Status: Involuntary Petitioner: ED Attending Is patient capable of signing voluntary admission?: No Referral Source: Self/Family/Friend Insurance type: [employer] Medicare  Medical Screening Exam ([place]) Medical Exam completed: Yes  Crisis Care Plan Living Arrangements: Spouse/significant other Legal Guardian: Other: (Self) Name of Psychiatrist: None Name of Therapist: None  Education Status Is patient currently in school?: No Is the patient employed, unemployed or receiving disability?: Receiving disability income  Risk to self with the past 6 months Suicidal Ideation: No-Not Currently/Within Last 6 Months Has patient been a risk to self within the past 6 months prior to admission? : No Suicidal Intent: No-Not Currently/Within Last 6 Months Has patient had any suicidal intent within the past 6 months prior to admission? : No Is patient at risk for suicide?: No Suicidal Plan?: No Has patient had any suicidal plan within the past 6 months prior to admission? : No Access to Means: No What has been your use of drugs/alcohol within the last 12 months?: None Previous Attempts/Gestures: Yes How  many times?: 3 Other Self Harm Risks: None Triggers for Past Attempts: Unknown Intentional Self Injurious Behavior: None Family Suicide History: Yes Recent stressful life event(s): Other (Comment) (No current medication, Depression symptom) Persecutory voices/beliefs?: No Depression: Yes Depression Symptoms: Isolating, Loss of interest in usual pleasures, Feeling worthless/self pity Substance abuse history and/or treatment for substance abuse?: No Suicide prevention information given to non-admitted patients: Not applicable  Risk to Others within the past 6 months Homicidal Ideation: No Does patient have any lifetime risk of violence toward others beyond the six months prior to admission? : No Thoughts of Harm to Others: No Current Homicidal Intent: No Current Homicidal Plan: No Access to Homicidal Means: No Identified Victim: None History of harm to others?: No Assessment of Violence: None Noted Violent Behavior Description: None Does patient have access to weapons?: No Criminal Charges Pending?: No Does patient have a court date: No Is patient on probation?: No  Psychosis Hallucinations: None noted Delusions: None noted  Mental Status Report Appearance/Hygiene: In scrubs Eye Contact: Good Motor Activity: Freedom of movement Speech: Logical/coherent Level of Consciousness: Alert Mood: Depressed Affect: Appropriate to circumstance Anxiety Level: None Thought Processes: Coherent Judgement: Unimpaired Orientation: Place, Person, Time, Situation, Appropriate for developmental age Obsessive Compulsive Thoughts/Behaviors: None  Cognitive Functioning Concentration: Normal Memory: Recent Intact, Remote Intact Is patient IDD: No Insight: Good Impulse Control: Fair Appetite: Poor Have you had any weight changes? : No Change Sleep: Decreased Total Hours of Sleep: 0 Vegetative Symptoms: None  ADLScreening [facility] Assessment Services) Patient's cognitive ability adequate  to safely complete daily activities?: Yes Patient able to express need for assistance with ADLs?: Yes Independently performs ADLs?: Yes (appropriate for developmental age)  Prior Inpatient Therapy Prior Inpatient Therapy: Yes Prior Therapy Dates: 2015 Prior Therapy Facilty/Provider(s): [facility] Reason for Treatment: Suicide Attempts  Prior Outpatient Therapy Prior Outpatient Therapy: No Does patient have an ACCT team?: No Does patient have Intensive In-House Services?  : No Does patient have [facility] services? : No Does patient have P4CC services?: No  ADL Screening (condition at time of admission) Patient's cognitive ability adequate to safely complete daily activities?: Yes Is the patient deaf or have difficulty hearing?: No Does the patient have difficulty seeing, even when wearing glasses/contacts?: No Does the patient have difficulty concentrating, remembering, or making decisions?: No Patient able to express  need for assistance with ADLs?: Yes Does the patient have difficulty dressing or bathing?: No Independently performs ADLs?: Yes (appropriate for developmental age) Does the patient have difficulty walking or climbing stairs?: No Weakness of Legs: None Weakness of Arms/Hands: None  Home Assistive Devices/Equipment Home Assistive Devices/Equipment: None  Therapy Consults (therapy consults require a physician order) PT Evaluation Needed: No OT Evalulation Needed: No SLP Evaluation Needed: No Abuse/Neglect Assessment (Assessment to be complete while patient is alone) Abuse/Neglect Assessment Can Be Completed: Yes Physical Abuse: Denies Verbal Abuse: Denies Sexual Abuse: Denies Exploitation of patient/patient's resources: Denies Self-Neglect: Denies Values / Beliefs Cultural Requests During Hospitalization: None Spiritual Requests During Hospitalization: None Consults Spiritual Care Consult Needed: No Transition of Care Team Consult Needed: No Advance Directives  (For Healthcare) Does Patient Have a Medical Advance Directive?: No Would patient like information on creating a medical advance directive?: No - Patient declined          Disposition: Per Psyc NP patient is recommended for Inpatient Hospitalization Disposition Initial Assessment Completed for this Encounter: Yes  On Site Evaluation by:   Reviewed with Physician:    Leonie Douglas MS [place] 03/31/2020 10:06 PM

## 2020-03-31 NOTE — Consult Note (Signed)
[facility] Face-to-Face Psychiatry Consult   Reason for Consult: Psychiatric Evaluation Referring Physician: Dr. Tamala Julian Patient Identification: [name] MRN:  [id number] Principal Diagnosis: <principal problem not specified> Diagnosis:  Active Problems:   Bipolar disorder ([place])   Bipolar affective disorder ([place])   Fatigue   Schizoaffective disorder, bipolar type ([place])   Total Time spent with patient: 1 hour  Subjective: "My psychiatrist changed my medication and stop seeing me." [name] is a 54 y.o. female patient presented to [facility] ED via POV initially the patient was voluntary and placed under involuntary commitment status (IVC). Per the ED triage nurse note, Pt states recent changes in PCP were told her PCP couldn't see her anymore. Pt says unsure if PCP changed her medication before D/C'in patient from the practice. Pt states multiple stressors at home. Pt states +SI at this time denies plan. Pt noted to be intermittently tearful in triage. The patient voiced a history of past suicidal attempts by overdosing on medications, attempted to hang herself, and slitting her wrist.  The patient expressed, her past psychiatrist decided to stop treating her because she needed to have a therapist as part of her treatment plan.  The patient voiced her medication was recently increased before her psychiatrist discontinuing care. The patient was seen face-to-face by this provider; the chart was reviewed and consulted with Dr.  Charna Archer on 03/31/2020 due to the patient's care. It was discussed with the EDP that the patient does meet the criteria to be admitted to the psychiatric inpatient unit.  On evaluation, the patient is alert and oriented x 4, sad, calm, cooperative, and mood-congruent with affect. The patient does not appear to be responding to internal or external stimuli. Neither is the patient presenting with any delusional thinking. The patient denies auditory or visual hallucinations. The  patient admits to suicidal ideation with a plan to overdose on her medication. The patient denies homicidal or self-harm ideations. The patient is not presenting with any psychotic or paranoid behaviors. During an encounter with the patient, she was able to answer questions appropriately. Collateral was obtained from husband, Mr. [name] [phone], who expresses concern for his wife due to her medication changes and her inability to do much during the day.  He said that now she does not take care of the granddaughter who has caused her to be more depressed.  He voiced that she does need her medication to be looked at and adjusted. Plan: The patient is a safety risk to self and requires psychiatric inpatient admission for stabilization and treatment.  HPI: Per Dr. Tamala Julian: [name] is a 54 y.o. female the past medical history of anxiety, asthma, bipolar disorder, CHF, COPD, depression, thyroidism, HTN, HDL, and reflux who presents for assessment of suicidal thoughts with a plan to overdose on pills and alcohol after she was recently discharged from her psychiatrist because she was told she needs to see a psychiatrist to consult by therapy services.  Patient states she is also been under a lot of stress because of family moving out of her home and that she has not slept in 3 to 4 days.  She states she has been taking her medications denies illegal drug use, EtOH abuse, or tobacco abuse auscultates.  States he is otherwise been feeling physically okay without any recent fevers, chills, cough, vomiting, diarrhea, dysuria, rash, recent traumatic injuries, or other acute physical complaints.  Denies HI but does endorse subtle visual hallucinations stating she sometimes thinks she sees  people in the periphery of her vision.  She has been having the symptoms for at least several weeks but have been progressive.  No other clear alleviating or aggravating factors.  Past Psychiatric History:   Anxiety Bipolar depression (HCC) Depression Headache RSL (restless leg syndrome) Sleep apnea  Risk to Self: Suicidal Ideation: No-Not Currently/Within Last 6 Months Suicidal Intent: No-Not Currently/Within Last 6 Months Is patient at risk for suicide?: No Suicidal Plan?: No Access to Means: No What has been your use of drugs/alcohol within the last 12 months?: None How many times?: 3 Other Self Harm Risks: None Triggers for Past Attempts: Unknown Intentional Self Injurious Behavior: None Risk to Others: Homicidal Ideation: No Thoughts of Harm to Others: No Current Homicidal Intent: No Current Homicidal Plan: No Access to Homicidal Means: No Identified Victim: None History of harm to others?: No Assessment of Violence: None Noted Violent Behavior Description: None Does patient have access to weapons?: No Criminal Charges Pending?: No Does patient have a court date: No Prior Inpatient Therapy: Prior Inpatient Therapy: Yes Prior Therapy Dates: 2015 Prior Therapy Facilty/Provider(s): [facility] Reason for Treatment: Suicide Attempts Prior Outpatient Therapy: Prior Outpatient Therapy: No Does patient have an ACCT team?: No Does patient have Intensive In-House Services?  : No Does patient have [facility] services? : No Does patient have P4CC services?: No  Past Medical History:  Past Medical History:  Diagnosis Date  . Anxiety   . Arthritis   . Asthma   . Bipolar depression ([place])   . CHF (congestive heart failure) ([place])   . Chronic headaches    migraines have lessened  . Chronic kidney disease    ddstage 111  . COPD (chronic obstructive pulmonary disease) ([place])   . Coronary artery disease   . Depression   . Diabetic neuropathy ([place])   . Dyspnea    uses inhalers for this  . Dysrhythmia    rapid  . Fatty liver   . GERD (gastroesophageal reflux disease)   . Headache   . Heart attack ([place]) 2009  . Hyperlipidemia   . Hypertension   . Hypomagnesemia   . Hypothyroid    . Irregular heart rhythm   . Liver lesion    Favored to be benign on CT; MRI of abd w/contract in Aug 2016. Liver Biopsy-Negative, March 2016  . Morbid obesity ([place])   . OSA (obstructive sleep apnea)   . Peripheral vascular disease ([place])   . Pinched nerve    Back  . Pneumonia   . RLS (restless legs syndrome)   . Seasonal allergies   . Sleep apnea    uses oxygen at night 2L d/t cpap did not fit  . Type 2 diabetes, uncontrolled, with neuropathy ([place]) 10/15/2015   Overview:  Microalbumin 7.7 12/2010: Hgb A1c 10.2% 12/2010 and 9.7% 03/2011, eye exam 10/2009 [facility]   . Urinary incontinence   . Vitamin D deficiency     Past Surgical History:  Procedure Laterality Date  . ABDOMINAL HYSTERECTOMY     Partial  . bladder stim  2007  . BLADDER SURGERY     x 2. tacking of bladder and the other was the stimulator placement  . CHOLECYSTECTOMY    . COLONOSCOPY WITH PROPOFOL N/A 03/04/2019   Procedure: COLONOSCOPY WITH PROPOFOL;  Surgeon: [name], MD;  Location: [facility] ENDOSCOPY;  Service: Endoscopy;  Laterality: N/A;  . ESOPHAGOGASTRODUODENOSCOPY (EGD) WITH PROPOFOL N/A 03/04/2019   Procedure: ESOPHAGOGASTRODUODENOSCOPY (EGD) WITH PROPOFOL;  Surgeon: [name], MD;  Location: [facility] ENDOSCOPY;  Service: Endoscopy;  Laterality: N/A;  . INCISION AND DRAINAGE ABSCESS Right 04/16/2019   Procedure: INCISION AND DRAINAGE ABSCESS;  Surgeon: Benjamine Sprague, DO;  Location: [facility] ORS;  Service: General;  Laterality: Right;  . INTERSTIM-GENERATOR CHANGE N/A 12/31/2017   Procedure: INTERSTIM-GENERATOR CHANGE;  Surgeon: Bjorn Loser, MD;  Location: [facility] ORS;  Service: Urology;  Laterality: N/A;  . LIVER BIOPSY  March 2016   Negative  . LUNG BIOPSY  2015   small spot on lung  . TUBAL LIGATION    . UPPER GI ENDOSCOPY    . WRIST SURGERY Right 11/02/2019   Family History:  Family History  Problem Relation Age of Onset  . Diabetes Mother   . Heart disease Mother   . Hyperlipidemia Mother   .  Hypertension Mother   . Kidney disease Mother   . Mental illness Mother   . Hypothyroidism Mother   . Stroke Mother   . Osteoporosis Mother   . Glaucoma Mother   . Congestive Heart Failure Mother   . Hypertension Father   . Asthma Daughter   . Cancer Daughter        throat  . Bipolar disorder Daughter   . Heart disease Maternal Grandfather   . Rheum arthritis Maternal Grandfather   . Cancer Maternal Grandfather        liver  . Kidney disease Maternal Grandfather   . Cancer Paternal Grandmother        lung  . Kidney disease Brother   . Breast cancer Maternal Grandmother 60  . Bipolar disorder Daughter   . Hypertension Daughter   . Restless legs syndrome Daughter    Family Psychiatric  History: Paternal-suicide attempts, alcohol abuse and Bipolar Social History:  Social History   Substance and Sexual Activity  Alcohol Use No     Social History   Substance and Sexual Activity  Drug Use No    Social History   Socioeconomic History  . Marital status: Married    Spouse name: Not on file  . Number of children: 2  . Years of education: 71  . Highest education level: Not on file  Occupational History  . Occupation: disabled  Tobacco Use  . Smoking status: Never Smoker  . Smokeless tobacco: Never Used  Vaping Use  . Vaping Use: Never used  Substance and Sexual Activity  . Alcohol use: No  . Drug use: No  . Sexual activity: Yes    Partners: Male    Birth control/protection: Surgical  Other Topics Concern  . Not on file  Social History Narrative   ** Merged History Encounter **       16-20 oz of caffeine a day       Pt lives with husband and grandchildren   Social Determinants of Health   Financial Resource Strain: Low Risk   . Difficulty of Paying Living Expenses: Not hard at all  Food Insecurity: No Food Insecurity  . Worried About Charity fundraiser in the Last Year: Never true  . Ran Out of Food in the Last Year: Never true  Transportation Needs: No  Transportation Needs  . Lack of Transportation (Medical): No  . Lack of Transportation (Non-Medical): No  Physical Activity: Inactive  . Days of Exercise per Week: 0 days  . Minutes of Exercise per Session: 0 min  Stress: Stress Concern Present  . Feeling of Stress : Very much  Social Connections: Moderately Isolated  . Frequency of Communication  with Friends and Family: More than three times a week  . Frequency of Social Gatherings with Friends and Family: More than three times a week  . Attends Religious Services: Never  . Active Member of Clubs or Organizations: No  . Attends Archivist Meetings: Never  . Marital Status: Married   Additional Social History:    Allergies:   Allergies  Allergen Reactions  . Mirtazapine Anaphylaxis    REACTION: closes throat  . Morphine And Related     anaphylaxis  . Sulfamethoxazole-Trimethoprim Rash    REACTION: Whelps all over  . Amoxicillin Er Nausea And Vomiting    Has patient had a PCN reaction causing immediate rash, facial/tongue/throat swelling, SOB or lightheadedness with hypotension: No Has patient had a PCN reaction causing severe rash involving mucus membranes or skin necrosis: No Has patient had a PCN reaction that required hospitalization: No Has patient had a PCN reaction occurring within the last 10 years: Yes If all of the above answers are "NO", then may proceed with Cephalosporin use.  . Codeine Itching    REACTION: itch, rash  . Hydrocodone-Acetaminophen Rash    REACTION: rash  . Keflex [Cephalexin] Rash  . Prasterone Nausea And Vomiting    Labs:  Results for orders placed or performed during the hospital encounter of 03/31/20 (from the past 48 hour(s))  Comprehensive metabolic panel     Status: Abnormal   Collection Time: 03/31/20  4:34 PM  Result Value Ref Range   Sodium 141 135 - 145 mmol/L   Potassium 3.8 3.5 - 5.1 mmol/L   Chloride 106 98 - 111 mmol/L   CO2 24 22 - 32 mmol/L   Glucose, Bld 251 (H)  70 - 99 mg/dL    Comment: Glucose reference range applies only to samples taken after fasting for at least 8 hours.   BUN 28 (H) 6 - 20 mg/dL   Creatinine, Ser 1.31 (H) 0.44 - 1.00 mg/dL   Calcium 8.8 (L) 8.9 - 10.3 mg/dL   Total Protein 6.5 6.5 - 8.1 g/dL   Albumin 3.8 3.5 - 5.0 g/dL   AST 40 15 - 41 U/L   ALT 39 0 - 44 U/L   Alkaline Phosphatase 54 38 - 126 U/L   Total Bilirubin 0.6 0.3 - 1.2 mg/dL   GFR calc non Af Amer 45 (L) >60 mL/min   GFR calc Af Amer 53 (L) >60 mL/min   Anion gap 11 5 - 15    Comment: Performed at [facility], [address], [place], [place] [address]  Ethanol     Status: None   Collection Time: 03/31/20  4:34 PM  Result Value Ref Range   Alcohol, Ethyl (B) <10 <10 mg/dL    Comment: (NOTE) Lowest detectable limit for serum alcohol is 10 mg/dL.  For medical purposes only. Performed at [facility], [address] [place], [place] [address]   Salicylate level     Status: Abnormal   Collection Time: 03/31/20  4:34 PM  Result Value Ref Range   Salicylate Lvl <0.3 (L) 7.0 - 30.0 mg/dL    Comment: Performed at [facility], [address] [place], [place] [address]  Acetaminophen level     Status: Abnormal   Collection Time: 03/31/20  4:34 PM  Result Value Ref Range   Acetaminophen (Tylenol), Serum <10 (L) 10 - 30 ug/mL    Comment: (NOTE) Therapeutic concentrations vary significantly. A range of 10-30 ug/mL  may be  an effective concentration for many patients. However, some  are best treated at concentrations outside of this range. Acetaminophen concentrations >150 ug/mL at 4 hours after ingestion  and >50 ug/mL at 12 hours after ingestion are often associated with  toxic reactions.  Performed at [facility], [address] [place], [place] [address]   cbc     Status: Abnormal   Collection Time: 03/31/20  4:34 PM  Result Value Ref Range   WBC 8.6 4.0 - 10.5 K/uL   RBC 3.83 (L) 3.87 - 5.11 MIL/uL    Hemoglobin 11.1 (L) 12.0 - 15.0 g/dL   HCT 36.2 36 - 46 %   MCV 94.5 80.0 - 100.0 fL   MCH 29.0 26.0 - 34.0 pg   MCHC 30.7 30.0 - 36.0 g/dL   RDW 14.3 11.5 - 15.5 %   Platelets 330 150 - 400 K/uL   nRBC 0.0 0.0 - 0.2 %    Comment: Performed at [facility] [address] [place], [place] [address]  Urine Drug Screen, Qualitative     Status: None   Collection Time: 03/31/20  4:34 PM  Result Value Ref Range   Tricyclic, Ur Screen NONE DETECTED NONE DETECTED   Amphetamines, Ur Screen NONE DETECTED NONE DETECTED   MDMA (Ecstasy)Ur Screen NONE DETECTED NONE DETECTED   Cocaine Metabolite,Ur [place] NONE DETECTED NONE DETECTED   Opiate, Ur Screen NONE DETECTED NONE DETECTED   Phencyclidine (PCP) Ur S NONE DETECTED NONE DETECTED   Cannabinoid 50 Ng, Ur [place] NONE DETECTED NONE DETECTED   Barbiturates, Ur Screen NONE DETECTED NONE DETECTED   Benzodiazepine, Ur Scrn NONE DETECTED NONE DETECTED   Methadone Scn, Ur NONE DETECTED NONE DETECTED    Comment: (NOTE) Tricyclics + metabolites, urine    Cutoff 1000 ng/mL Amphetamines + metabolites, urine  Cutoff 1000 ng/mL MDMA (Ecstasy), urine              Cutoff 500 ng/mL Cocaine Metabolite, urine          Cutoff 300 ng/mL Opiate + metabolites, urine        Cutoff 300 ng/mL Phencyclidine (PCP), urine         Cutoff 25 ng/mL Cannabinoid, urine                 Cutoff 50 ng/mL Barbiturates + metabolites, urine  Cutoff 200 ng/mL Benzodiazepine, urine              Cutoff 200 ng/mL Methadone, urine                   Cutoff 300 ng/mL  The urine drug screen provides only a preliminary, unconfirmed analytical test result and should not be used for non-medical purposes. Clinical consideration and professional judgment should be applied to any positive drug screen result due to possible interfering substances. A more specific alternate chemical method must be used in order to obtain a confirmed analytical result. Gas chromatography / mass spectrometry  (GC/MS) is the preferred confirm atory method. Performed at [facility], [address] [place],  [address]   Glucose, capillary     Status: Abnormal   Collection Time: 03/31/20  7:50 PM  Result Value Ref Range   Glucose-Capillary 244 (H) 70 - 99 mg/dL    Comment: Glucose reference range applies only to samples taken after fasting for at least 8 hours.    Current Facility-Administered Medications  Medication Dose Route Frequency Provider Last Rate Last Admin  .  acetaminophen (TYLENOL) tablet 1,000 mg  1,000 mg Oral Q6H PRN Blake Divine, MD   1,000 mg at 03/31/20 2022  . atorvastatin (LIPITOR) tablet 80 mg  80 mg Oral QHS Lucrezia Starch, MD   80 mg at 03/31/20 2031  . ezetimibe (ZETIA) tablet 10 mg  10 mg Oral Daily Lucrezia Starch, MD   10 mg at 03/31/20 2028  . insulin aspart (novoLOG) injection 0-15 Units  0-15 Units Subcutaneous TID WC Lucrezia Starch, MD   5 Units at 03/31/20 2022  . lisinopril (ZESTRIL) tablet 2.5 mg  2.5 mg Oral Daily Lucrezia Starch, MD   2.5 mg at 03/31/20 2029  . pantoprazole (PROTONIX) EC tablet 40 mg  40 mg Oral Daily Lucrezia Starch, MD   40 mg at 03/31/20 2029  . traZODone (DESYREL) tablet 100 mg  100 mg Oral QHS Blake Divine, MD   100 mg at 03/31/20 2030   Current Outpatient Medications  Medication Sig Dispense Refill  . albuterol (VENTOLIN HFA) 108 (90 Base) MCG/ACT inhaler Inhale 2 puffs into the lungs every 6 (six) hours as needed for wheezing or shortness of breath. 18 g 3  . atorvastatin (LIPITOR) 80 MG tablet Take 80 mg by mouth at bedtime.     . benzonatate (TESSALON) 100 MG capsule Take 1-2 capsules (100-200 mg total) by mouth 2 (two) times daily as needed. 40 capsule 0  . busPIRone (BUSPAR) 10 MG tablet Take 20 mg by mouth 2 (two) times daily.     . citalopram (CELEXA) 20 MG tablet Take 20 mg by mouth daily.    . empagliflozin (JARDIANCE) 25 MG TABS tablet Take 25 mg by mouth daily.    Marland Kitchen ezetimibe (ZETIA) 10 MG  tablet Take 10 mg by mouth daily.     . fenofibrate micronized (LOFIBRA) 134 MG capsule Take 134 mg by mouth daily.    . fluticasone (FLONASE) 50 MCG/ACT nasal spray Place 2 sprays into both nostrils daily. 16 g 0  . gabapentin (NEURONTIN) 300 MG capsule Take 1-2 capsules (300-600 mg total) by mouth 3 (three) times daily. Take 1 capsule each morning, 1 capsule each afternoon, and 2 capsules at bedtime. 360 capsule 0  . HUMULIN R U-500 KWIKPEN 500 UNIT/ML kwikpen Inject 70 Units as directed 3 (three) times daily.     . insulin aspart (NOVOLOG) 100 UNIT/ML injection Inject 5-25 Units into the skin 3 (three) times daily before meals.     . lamoTRIgine (LAMICTAL) 200 MG tablet Take 200 mg by mouth 2 (two) times daily.     Marland Kitchen levothyroxine (SYNTHROID) 50 MCG tablet TAKE ONE TABLET DAILY 3 DAYS A WEEK, THEN TAKE ONE AND ONE-HALF TABLETS FOUR DAYS A WEEK 108 tablet 0  . lisinopril (ZESTRIL) 2.5 MG tablet TAKE 1 TABLET BY MOUTH EVERY DAY 90 tablet 1  . meclizine (ANTIVERT) 12.5 MG tablet Take 1 tablet (12.5 mg total) by mouth 3 (three) times daily as needed for dizziness. 30 tablet 1  . metoprolol tartrate (LOPRESSOR) 25 MG tablet Take 0.5-1 tablets (12.5-25 mg total) by mouth 2 (two) times daily. TAKE 1 TABLET (25MG TOTAL) IN THE MORNING AND 1/2 TABLET (12.5 mg) IN THE EVENING 135 tablet 0  . Omega-3 Fatty Acids (FISH OIL PO) Take by mouth daily.    . ondansetron (ZOFRAN) 4 MG tablet TAKE 1 TABLET BY MOUTH EVERY 8 HOURS AS NEEDED FOR NAUSEA OR VOMITING (Patient taking differently: Take 4 mg by mouth every 8 (eight) hours  as needed for nausea or vomiting. ) 20 tablet 2  . ONETOUCH VERIO test strip     . pantoprazole (PROTONIX) 40 MG tablet Take 1 tablet (40 mg total) by mouth daily. 90 tablet 3  . polyethylene glycol (MIRALAX) 17 g packet Take 17 g by mouth daily. (Patient taking differently: Take 17 g by mouth daily as needed for mild constipation. ) 14 each 0  . QUEtiapine (SEROQUEL) 300 MG tablet Take 300  mg by mouth at bedtime.    . traMADol (ULTRAM) 50 MG tablet Take 50 mg by mouth every 6 (six) hours as needed.    . traZODone (DESYREL) 100 MG tablet Take 100 mg by mouth at bedtime.    . TRULICITY 1.5 JO/8.7OM SOPN Inject 1.5 mLs as directed every Sunday.     Marland Kitchen ULTICARE SHORT PEN NEEDLES 31G X 8 MM MISC       Musculoskeletal: Strength & Muscle Tone: within normal limits Gait & Station: normal Patient leans: N/A  Psychiatric Specialty Exam: Physical Exam Psychiatric:        Attention and Perception: Attention and perception normal.        Mood and Affect: Mood is depressed. Affect is flat.        Speech: Speech normal.        Behavior: Behavior is uncooperative.        Thought Content: Thought content includes suicidal ideation. Thought content includes suicidal plan.        Cognition and Memory: Cognition normal.        Judgment: Judgment is impulsive.     Review of Systems  Psychiatric/Behavioral: Positive for sleep disturbance and suicidal ideas. The patient is nervous/anxious.   All other systems reviewed and are negative.   Blood pressure (!) 137/60, pulse 77, temperature 97.7 F (36.5 C), temperature source Oral, resp. rate 18, height 4' 11"  (1.499 m), weight 86.2 kg, SpO2 96 %.Body mass index is 38.38 kg/m.  General Appearance: Casual  Eye Contact:  Good  Speech:  Clear and Coherent  Volume:  Normal  Mood:  Anxious and Depressed  Affect:  Congruent  Thought Process:  Coherent  Orientation:  Full (Time, Place, and Person)  Thought Content:  WDL and Logical  Suicidal Thoughts:  Yes.  with intent/plan  Homicidal Thoughts:  No  Memory:  Immediate;   Good Recent;   Good Remote;   Good  Judgement:  Poor  Insight:  Lacking  Psychomotor Activity:  Normal  Concentration:  Concentration: Good and Attention Span: Good  Recall:  Good  Fund of Knowledge:  Good  Language:  Good  Akathisia:  Negative  Handed:  Right  AIMS (if indicated):     Assets:  Communication  Skills Desire for Improvement Leisure Time Resilience Social Support  ADL's:  Intact  Cognition:  WNL  Sleep:    Insomnia     Treatment Plan Summary: Medication management and Plan Patient meets criteria for psychiatric inpatient admission.  Disposition: Recommend psychiatric Inpatient admission when medically cleared. Supportive therapy provided about ongoing stressors.  Caroline Sauger, NP 03/31/2020 10:41 PM

## 2020-04-01 LAB — GLUCOSE, CAPILLARY: Glucose-Capillary: 247 mg/dL — ABNORMAL HIGH (ref 70–99)

## 2020-04-01 LAB — HEMOGLOBIN A1C
Hgb A1c MFr Bld: 10.3 % — ABNORMAL HIGH (ref 4.8–5.6)
Mean Plasma Glucose: 248.91 mg/dL

## 2020-04-01 LAB — SARS CORONAVIRUS 2 BY RT PCR (HOSPITAL ORDER, PERFORMED IN ~~LOC~~ HOSPITAL LAB): SARS Coronavirus 2: NEGATIVE

## 2020-04-01 MED ORDER — LORAZEPAM 0.5 MG PO TABS
0.5000 mg | ORAL_TABLET | Freq: Once | ORAL | Status: AC
  Administered 2020-04-01: 0.5 mg via ORAL
  Filled 2020-04-01: qty 1

## 2020-04-01 NOTE — ED Notes (Signed)
Pt asking for medication to help ease her anxiety.  EDP made aware. Medication will be given as ordered.

## 2020-04-01 NOTE — ED Notes (Signed)
RN informed pt she will be transferred to [facility]. Pt tearful, but accepting.

## 2020-04-01 NOTE — ED Notes (Signed)
Pt ambulated to bathroom 

## 2020-04-01 NOTE — ED Provider Notes (Signed)
Emergency Medicine Observation Re-evaluation Note  [name] is a 54 y.o. female, seen on rounds today.  Pt initially presented to the ED for complaints of Psychiatric Evaluation Currently, the patient is sleeping.  Physical Exam  BP (!) 137/60 (BP Location: Right Arm)   Pulse 77   Temp 97.7 F (36.5 C) (Oral)   Resp 18   Ht 1.499 m (4' 11" )   Wt 86.2 kg   SpO2 96%   BMI 38.38 kg/m  Physical Exam  General:  no acute distress Resp:  breathing easily and comfortably Neuro:  Moving all four extremities, no gross focal neurological deficits Psych:  sleeping currently, calm and cooperative when awake.  ED Course / MDM  EKG:    I have reviewed the labs performed to date as well as medications administered while in observation.  Recent changes in the last 24 hours include psych plan for transfer to [facility] in the morning. Plan  Current plan is for transfer to [facility] pending COVID test. Patient is under full IVC at this time.   Hinda Kehr, MD 04/01/20 [phone]

## 2020-04-01 NOTE — ED Notes (Signed)
Diet drink given to patient at this time

## 2020-04-01 NOTE — ED Notes (Signed)
Pt is going to [facility] today anytime after 0800 per TTS report.

## 2020-04-01 NOTE — BH Assessment (Signed)
PATIENT BED AVAILABLE AFTER 8AM   Patient has been accepted to [facility].  Patient assigned to [facility] Accepting physician is Dr. Jonelle Sports.  Call report to [phone].  Representative was Pax.   ER Staff is aware of it:  [facility] ER Secretary  Dr. Karma Greaser, ER MD  Shanon Brow Patient's Nurse     Address: [address] [place], [place] [address]

## 2020-04-01 NOTE — ED Notes (Signed)
Pt discharged under IVC to [facility].  VS stable. CBG 247 - insulin administered. Belongings sent with officer.  Pt calm and cooperative.

## 2020-04-01 NOTE — ED Notes (Signed)
RN reported a CBG of 247 to [facility].   RN asked to administer insulin even though RN reported the patient has not yet received breakfast.

## 2020-04-16 ENCOUNTER — Other Ambulatory Visit: Payer: Self-pay

## 2020-04-16 DIAGNOSIS — E038 Other specified hypothyroidism: Secondary | ICD-10-CM

## 2020-04-27 ENCOUNTER — Emergency Department
Admission: EM | Admit: 2020-04-27 | Discharge: 2020-04-27 | Discharge status: Against Medical Advice | Payer: Medicare HMO

## 2020-04-28 ENCOUNTER — Inpatient Hospital Stay
Admission: EM | Admit: 2020-04-28 | Discharge: 2020-05-02 | DRG: 177 | Disposition: A | Discharge status: Home Health | Payer: Medicare HMO | Attending: Internal Medicine | Admitting: Internal Medicine

## 2020-04-28 ENCOUNTER — Other Ambulatory Visit: Payer: Self-pay

## 2020-04-28 ENCOUNTER — Ambulatory Visit: Payer: Self-pay | Admitting: Family Medicine

## 2020-04-28 ENCOUNTER — Emergency Department: Payer: Medicare HMO

## 2020-04-28 DIAGNOSIS — E038 Other specified hypothyroidism: Secondary | ICD-10-CM | POA: Diagnosis not present

## 2020-04-28 DIAGNOSIS — Z6839 Body mass index (BMI) 39.0-39.9, adult: Secondary | ICD-10-CM | POA: Diagnosis not present

## 2020-04-28 DIAGNOSIS — D72819 Decreased white blood cell count, unspecified: Secondary | ICD-10-CM | POA: Diagnosis present

## 2020-04-28 DIAGNOSIS — M79604 Pain in right leg: Secondary | ICD-10-CM | POA: Diagnosis present

## 2020-04-28 DIAGNOSIS — Z885 Allergy status to narcotic agent status: Secondary | ICD-10-CM

## 2020-04-28 DIAGNOSIS — M79605 Pain in left leg: Secondary | ICD-10-CM | POA: Diagnosis present

## 2020-04-28 DIAGNOSIS — K219 Gastro-esophageal reflux disease without esophagitis: Secondary | ICD-10-CM | POA: Diagnosis present

## 2020-04-28 DIAGNOSIS — I2510 Atherosclerotic heart disease of native coronary artery without angina pectoris: Secondary | ICD-10-CM | POA: Diagnosis present

## 2020-04-28 DIAGNOSIS — I129 Hypertensive chronic kidney disease with stage 1 through stage 4 chronic kidney disease, or unspecified chronic kidney disease: Secondary | ICD-10-CM | POA: Diagnosis present

## 2020-04-28 DIAGNOSIS — Z881 Allergy status to other antibiotic agents status: Secondary | ICD-10-CM

## 2020-04-28 DIAGNOSIS — F319 Bipolar disorder, unspecified: Secondary | ICD-10-CM | POA: Diagnosis not present

## 2020-04-28 DIAGNOSIS — I10 Essential (primary) hypertension: Secondary | ICD-10-CM | POA: Diagnosis not present

## 2020-04-28 DIAGNOSIS — G4733 Obstructive sleep apnea (adult) (pediatric): Secondary | ICD-10-CM | POA: Diagnosis present

## 2020-04-28 DIAGNOSIS — G2581 Restless legs syndrome: Secondary | ICD-10-CM | POA: Diagnosis present

## 2020-04-28 DIAGNOSIS — Z83438 Family history of other disorder of lipoprotein metabolism and other lipidemia: Secondary | ICD-10-CM

## 2020-04-28 DIAGNOSIS — I252 Old myocardial infarction: Secondary | ICD-10-CM

## 2020-04-28 DIAGNOSIS — Z9981 Dependence on supplemental oxygen: Secondary | ICD-10-CM | POA: Diagnosis not present

## 2020-04-28 DIAGNOSIS — J9601 Acute respiratory failure with hypoxia: Secondary | ICD-10-CM

## 2020-04-28 DIAGNOSIS — J42 Unspecified chronic bronchitis: Secondary | ICD-10-CM | POA: Diagnosis present

## 2020-04-28 DIAGNOSIS — J440 Chronic obstructive pulmonary disease with acute lower respiratory infection: Secondary | ICD-10-CM | POA: Diagnosis present

## 2020-04-28 DIAGNOSIS — E1140 Type 2 diabetes mellitus with diabetic neuropathy, unspecified: Secondary | ICD-10-CM | POA: Diagnosis present

## 2020-04-28 DIAGNOSIS — F419 Anxiety disorder, unspecified: Secondary | ICD-10-CM | POA: Diagnosis present

## 2020-04-28 DIAGNOSIS — E1151 Type 2 diabetes mellitus with diabetic peripheral angiopathy without gangrene: Secondary | ICD-10-CM | POA: Diagnosis present

## 2020-04-28 DIAGNOSIS — K7581 Nonalcoholic steatohepatitis (NASH): Secondary | ICD-10-CM | POA: Diagnosis present

## 2020-04-28 DIAGNOSIS — Z79890 Hormone replacement therapy (postmenopausal): Secondary | ICD-10-CM

## 2020-04-28 DIAGNOSIS — Z8261 Family history of arthritis: Secondary | ICD-10-CM

## 2020-04-28 DIAGNOSIS — Z825 Family history of asthma and other chronic lower respiratory diseases: Secondary | ICD-10-CM

## 2020-04-28 DIAGNOSIS — Z888 Allergy status to other drugs, medicaments and biological substances status: Secondary | ICD-10-CM

## 2020-04-28 DIAGNOSIS — Z882 Allergy status to sulfonamides status: Secondary | ICD-10-CM

## 2020-04-28 DIAGNOSIS — U071 COVID-19: Secondary | ICD-10-CM | POA: Diagnosis not present

## 2020-04-28 DIAGNOSIS — J9621 Acute and chronic respiratory failure with hypoxia: Secondary | ICD-10-CM | POA: Diagnosis present

## 2020-04-28 DIAGNOSIS — N179 Acute kidney failure, unspecified: Secondary | ICD-10-CM | POA: Diagnosis present

## 2020-04-28 DIAGNOSIS — F3130 Bipolar disorder, current episode depressed, mild or moderate severity, unspecified: Secondary | ICD-10-CM | POA: Diagnosis present

## 2020-04-28 DIAGNOSIS — Z823 Family history of stroke: Secondary | ICD-10-CM

## 2020-04-28 DIAGNOSIS — E1141 Type 2 diabetes mellitus with diabetic mononeuropathy: Secondary | ICD-10-CM | POA: Diagnosis present

## 2020-04-28 DIAGNOSIS — E039 Hypothyroidism, unspecified: Secondary | ICD-10-CM | POA: Diagnosis present

## 2020-04-28 DIAGNOSIS — R42 Dizziness and giddiness: Secondary | ICD-10-CM | POA: Diagnosis present

## 2020-04-28 DIAGNOSIS — N183 Chronic kidney disease, stage 3 unspecified: Secondary | ICD-10-CM | POA: Diagnosis not present

## 2020-04-28 DIAGNOSIS — J441 Chronic obstructive pulmonary disease with (acute) exacerbation: Secondary | ICD-10-CM | POA: Diagnosis present

## 2020-04-28 DIAGNOSIS — E669 Obesity, unspecified: Secondary | ICD-10-CM | POA: Diagnosis present

## 2020-04-28 DIAGNOSIS — G43909 Migraine, unspecified, not intractable, without status migrainosus: Secondary | ICD-10-CM | POA: Diagnosis present

## 2020-04-28 DIAGNOSIS — Z833 Family history of diabetes mellitus: Secondary | ICD-10-CM

## 2020-04-28 DIAGNOSIS — N1832 Chronic kidney disease, stage 3b: Secondary | ICD-10-CM | POA: Diagnosis present

## 2020-04-28 DIAGNOSIS — Z8249 Family history of ischemic heart disease and other diseases of the circulatory system: Secondary | ICD-10-CM

## 2020-04-28 DIAGNOSIS — Z794 Long term (current) use of insulin: Secondary | ICD-10-CM

## 2020-04-28 DIAGNOSIS — E1122 Type 2 diabetes mellitus with diabetic chronic kidney disease: Secondary | ICD-10-CM | POA: Diagnosis present

## 2020-04-28 DIAGNOSIS — J449 Chronic obstructive pulmonary disease, unspecified: Secondary | ICD-10-CM | POA: Diagnosis not present

## 2020-04-28 DIAGNOSIS — Z79899 Other long term (current) drug therapy: Secondary | ICD-10-CM

## 2020-04-28 DIAGNOSIS — Z803 Family history of malignant neoplasm of breast: Secondary | ICD-10-CM

## 2020-04-28 DIAGNOSIS — E1165 Type 2 diabetes mellitus with hyperglycemia: Secondary | ICD-10-CM | POA: Diagnosis present

## 2020-04-28 DIAGNOSIS — Z8616 Personal history of COVID-19: Secondary | ICD-10-CM

## 2020-04-28 DIAGNOSIS — J1282 Pneumonia due to coronavirus disease 2019: Secondary | ICD-10-CM | POA: Diagnosis present

## 2020-04-28 DIAGNOSIS — Z818 Family history of other mental and behavioral disorders: Secondary | ICD-10-CM

## 2020-04-28 DIAGNOSIS — Z841 Family history of disorders of kidney and ureter: Secondary | ICD-10-CM

## 2020-04-28 DIAGNOSIS — E785 Hyperlipidemia, unspecified: Secondary | ICD-10-CM | POA: Diagnosis present

## 2020-04-28 DIAGNOSIS — IMO0002 Reserved for concepts with insufficient information to code with codable children: Secondary | ICD-10-CM | POA: Diagnosis present

## 2020-04-28 DIAGNOSIS — I38 Endocarditis, valve unspecified: Secondary | ICD-10-CM | POA: Diagnosis present

## 2020-04-28 DIAGNOSIS — Z83511 Family history of glaucoma: Secondary | ICD-10-CM

## 2020-04-28 DIAGNOSIS — Z886 Allergy status to analgesic agent status: Secondary | ICD-10-CM

## 2020-04-28 DIAGNOSIS — N3010 Interstitial cystitis (chronic) without hematuria: Secondary | ICD-10-CM | POA: Diagnosis present

## 2020-04-28 DIAGNOSIS — Z8262 Family history of osteoporosis: Secondary | ICD-10-CM

## 2020-04-28 DIAGNOSIS — R81 Glycosuria: Secondary | ICD-10-CM | POA: Diagnosis present

## 2020-04-28 HISTORY — DX: Personal history of COVID-19: Z86.16

## 2020-04-28 LAB — BASIC METABOLIC PANEL
Anion gap: 14 (ref 5–15)
BUN: 35 mg/dL — ABNORMAL HIGH (ref 6–20)
CO2: 22 mmol/L (ref 22–32)
Calcium: 8.8 mg/dL — ABNORMAL LOW (ref 8.9–10.3)
Chloride: 99 mmol/L (ref 98–111)
Creatinine, Ser: 1.82 mg/dL — ABNORMAL HIGH (ref 0.44–1.00)
GFR calc Af Amer: 35 mL/min — ABNORMAL LOW (ref >60)
GFR calc non Af Amer: 31 mL/min — ABNORMAL LOW (ref >60)
Glucose, Bld: 247 mg/dL — ABNORMAL HIGH (ref 70–99)
Potassium: 4.6 mmol/L (ref 3.5–5.1)
Sodium: 135 mmol/L (ref 135–145)

## 2020-04-28 LAB — URINALYSIS, COMPLETE (UACMP) WITH MICROSCOPIC
Bacteria, UA: NONE SEEN
Bilirubin Urine: NEGATIVE
Glucose, UA: >=500 mg/dL — AB
Ketones, ur: 20 mg/dL — AB
Leukocytes,Ua: NEGATIVE
Nitrite: NEGATIVE
Protein, ur: NEGATIVE mg/dL
Specific Gravity, Urine: 1.026 (ref 1.005–1.030)
pH: 5 (ref 5.0–8.0)

## 2020-04-28 LAB — SARS CORONAVIRUS 2 BY RT PCR (HOSPITAL ORDER, PERFORMED IN ~~LOC~~ HOSPITAL LAB): SARS Coronavirus 2: POSITIVE — AB

## 2020-04-28 LAB — CBC WITH DIFFERENTIAL/PLATELET
Abs Immature Granulocytes: 0.03 10*3/uL (ref 0.00–0.07)
Basophils Absolute: 0 10*3/uL (ref 0.0–0.1)
Basophils Relative: 0 %
Eosinophils Absolute: 0 10*3/uL (ref 0.0–0.5)
Eosinophils Relative: 0 %
HCT: 35.3 % — ABNORMAL LOW (ref 36.0–46.0)
Hemoglobin: 11 g/dL — ABNORMAL LOW (ref 12.0–15.0)
Immature Granulocytes: 1 %
Lymphocytes Relative: 31 %
Lymphs Abs: 1.1 10*3/uL (ref 0.7–4.0)
MCH: 29.3 pg (ref 26.0–34.0)
MCHC: 31.2 g/dL (ref 30.0–36.0)
MCV: 93.9 fL (ref 80.0–100.0)
Monocytes Absolute: 0.3 10*3/uL (ref 0.1–1.0)
Monocytes Relative: 10 %
Neutro Abs: 2 10*3/uL (ref 1.7–7.7)
Neutrophils Relative %: 58 %
Platelets: 162 10*3/uL (ref 150–400)
RBC: 3.76 MIL/uL — ABNORMAL LOW (ref 3.87–5.11)
RDW: 15.1 % (ref 11.5–15.5)
WBC: 3.4 10*3/uL — ABNORMAL LOW (ref 4.0–10.5)
nRBC: 0 % (ref 0.0–0.2)

## 2020-04-28 LAB — LACTIC ACID, PLASMA: Lactic Acid, Venous: 1.2 mmol/L (ref 0.5–1.9)

## 2020-04-28 LAB — PROCALCITONIN: Procalcitonin: <0.1 ng/mL

## 2020-04-28 LAB — CBC
HCT: 35.1 % — ABNORMAL LOW (ref 36.0–46.0)
Hemoglobin: 11 g/dL — ABNORMAL LOW (ref 12.0–15.0)
MCH: 28.9 pg (ref 26.0–34.0)
MCHC: 31.3 g/dL (ref 30.0–36.0)
MCV: 92.1 fL (ref 80.0–100.0)
Platelets: 167 10*3/uL (ref 150–400)
RBC: 3.81 MIL/uL — ABNORMAL LOW (ref 3.87–5.11)
RDW: 15 % (ref 11.5–15.5)
WBC: 3.3 10*3/uL — ABNORMAL LOW (ref 4.0–10.5)
nRBC: 0 % (ref 0.0–0.2)

## 2020-04-28 LAB — FIBRIN DERIVATIVES D-DIMER (ARMC ONLY): Fibrin derivatives D-dimer (ARMC): 653.87 ng/mL (FEU) — ABNORMAL HIGH (ref 0.00–499.00)

## 2020-04-28 LAB — FIBRINOGEN: Fibrinogen: 475 mg/dL (ref 210–475)

## 2020-04-28 LAB — GLUCOSE, CAPILLARY: Glucose-Capillary: 273 mg/dL — ABNORMAL HIGH (ref 70–99)

## 2020-04-28 MED ORDER — EZETIMIBE 10 MG PO TABS
10.0000 mg | ORAL_TABLET | Freq: Every day | ORAL | Status: DC
  Administered 2020-04-29 – 2020-05-02 (×4): 10 mg via ORAL
  Filled 2020-04-28 (×4): qty 1

## 2020-04-28 MED ORDER — TRAZODONE HCL 50 MG PO TABS
100.0000 mg | ORAL_TABLET | Freq: Every day | ORAL | Status: DC
  Administered 2020-04-28 – 2020-05-01 (×4): 100 mg via ORAL
  Filled 2020-04-28: qty 2
  Filled 2020-04-28: qty 1
  Filled 2020-04-28 (×2): qty 2

## 2020-04-28 MED ORDER — TRAZODONE HCL 50 MG PO TABS: 50.0000 mg | ORAL_TABLET | Freq: Every evening | ORAL | Status: DC | PRN

## 2020-04-28 MED ORDER — SODIUM CHLORIDE 0.9 % IV SOLN
500.0000 mg | Freq: Once | INTRAVENOUS | Status: AC
  Administered 2020-04-28: 500 mg via INTRAVENOUS
  Filled 2020-04-28: qty 500

## 2020-04-28 MED ORDER — ALBUTEROL SULFATE HFA 108 (90 BASE) MCG/ACT IN AERS
2.0000 | INHALATION_SPRAY | Freq: Four times a day (QID) | RESPIRATORY_TRACT | Status: DC | PRN
  Filled 2020-04-28: qty 6.7

## 2020-04-28 MED ORDER — SODIUM CHLORIDE 0.9 % IV BOLUS
1000.0000 mL | Freq: Once | INTRAVENOUS | Status: AC
  Administered 2020-04-28: 1000 mL via INTRAVENOUS

## 2020-04-28 MED ORDER — ATORVASTATIN CALCIUM 20 MG PO TABS
80.0000 mg | ORAL_TABLET | Freq: Every day | ORAL | Status: DC
  Administered 2020-04-29 – 2020-05-01 (×4): 80 mg via ORAL
  Filled 2020-04-28 (×4): qty 4

## 2020-04-28 MED ORDER — GUAIFENESIN-DM 100-10 MG/5ML PO SYRP
10.0000 mL | ORAL_SOLUTION | ORAL | Status: DC | PRN
  Administered 2020-04-30: 10 mL via ORAL
  Filled 2020-04-28 (×3): qty 10

## 2020-04-28 MED ORDER — FENOFIBRATE 160 MG PO TABS
160.0000 mg | ORAL_TABLET | Freq: Every day | ORAL | Status: DC
  Filled 2020-04-28: qty 1

## 2020-04-28 MED ORDER — LISINOPRIL 5 MG PO TABS: 2.5000 mg | ORAL_TABLET | Freq: Every day | ORAL | Status: DC

## 2020-04-28 MED ORDER — ONDANSETRON HCL 4 MG/2ML IJ SOLN
4.0000 mg | Freq: Once | INTRAMUSCULAR | Status: AC
  Administered 2020-04-28: 4 mg via INTRAVENOUS
  Filled 2020-04-28: qty 2

## 2020-04-28 MED ORDER — IPRATROPIUM-ALBUTEROL 20-100 MCG/ACT IN AERS
1.0000 | INHALATION_SPRAY | Freq: Four times a day (QID) | RESPIRATORY_TRACT | Status: DC
  Administered 2020-04-29 – 2020-05-02 (×14): 1 via RESPIRATORY_TRACT
  Filled 2020-04-28: qty 4

## 2020-04-28 MED ORDER — DEXAMETHASONE 4 MG PO TABS
6.0000 mg | ORAL_TABLET | ORAL | Status: DC
  Administered 2020-04-29 – 2020-05-01 (×3): 6 mg via ORAL
  Filled 2020-04-28 (×3): qty 2

## 2020-04-28 MED ORDER — INSULIN GLARGINE 100 UNIT/ML ~~LOC~~ SOLN
20.0000 [IU] | Freq: Every day | SUBCUTANEOUS | Status: DC
  Administered 2020-04-28: 20 [IU] via SUBCUTANEOUS
  Filled 2020-04-28 (×2): qty 0.2

## 2020-04-28 MED ORDER — SODIUM CHLORIDE 0.9 % IV SOLN
200.0000 mg | Freq: Once | INTRAVENOUS | Status: AC
  Administered 2020-04-28: 200 mg via INTRAVENOUS
  Filled 2020-04-28: qty 200

## 2020-04-28 MED ORDER — BUSPIRONE HCL 10 MG PO TABS
20.0000 mg | ORAL_TABLET | Freq: Two times a day (BID) | ORAL | Status: DC
  Administered 2020-04-28 – 2020-05-02 (×8): 20 mg via ORAL
  Filled 2020-04-28 (×7): qty 2
  Filled 2020-04-28: qty 4
  Filled 2020-04-28: qty 2

## 2020-04-28 MED ORDER — LEVOTHYROXINE SODIUM 50 MCG PO TABS: 50.0000 ug | ORAL_TABLET | Freq: Every day | ORAL | Status: DC

## 2020-04-28 MED ORDER — PANTOPRAZOLE SODIUM 40 MG PO TBEC
40.0000 mg | DELAYED_RELEASE_TABLET | Freq: Every day | ORAL | Status: DC
  Administered 2020-04-29 – 2020-05-02 (×4): 40 mg via ORAL
  Filled 2020-04-28 (×4): qty 1

## 2020-04-28 MED ORDER — GABAPENTIN 300 MG PO CAPS
300.0000 mg | ORAL_CAPSULE | Freq: Three times a day (TID) | ORAL | Status: DC
  Administered 2020-04-28: 300 mg via ORAL
  Filled 2020-04-28: qty 1

## 2020-04-28 MED ORDER — SODIUM CHLORIDE 0.9 % IV SOLN
100.0000 mg | Freq: Every day | INTRAVENOUS | Status: AC
  Administered 2020-04-29 – 2020-05-02 (×4): 100 mg via INTRAVENOUS
  Filled 2020-04-28 (×4): qty 20

## 2020-04-28 MED ORDER — LEVOFLOXACIN 750 MG PO TABS: 750.0000 mg | ORAL_TABLET | Freq: Once | ORAL | Status: DC

## 2020-04-28 MED ORDER — METOPROLOL TARTRATE 25 MG PO TABS
12.5000 mg | ORAL_TABLET | Freq: Two times a day (BID) | ORAL | Status: DC
  Administered 2020-04-28: 25 mg via ORAL
  Filled 2020-04-28: qty 1

## 2020-04-28 MED ORDER — LAMOTRIGINE 100 MG PO TABS
200.0000 mg | ORAL_TABLET | Freq: Two times a day (BID) | ORAL | Status: DC
  Administered 2020-04-28 – 2020-05-02 (×8): 200 mg via ORAL
  Filled 2020-04-28 (×8): qty 2

## 2020-04-28 MED ORDER — INSULIN ASPART 100 UNIT/ML ~~LOC~~ SOLN
0.0000 [IU] | Freq: Three times a day (TID) | SUBCUTANEOUS | Status: DC
  Administered 2020-04-29: 15 [IU] via SUBCUTANEOUS
  Administered 2020-04-29 – 2020-04-30 (×2): 11 [IU] via SUBCUTANEOUS
  Administered 2020-04-30 – 2020-05-01 (×3): 7 [IU] via SUBCUTANEOUS
  Administered 2020-05-01: 20 [IU] via SUBCUTANEOUS
  Administered 2020-05-01: 11 [IU] via SUBCUTANEOUS
  Administered 2020-05-02: 14:00:00 15 [IU] via SUBCUTANEOUS
  Administered 2020-05-02: 08:00:00 11 [IU] via SUBCUTANEOUS
  Filled 2020-04-28 (×11): qty 1

## 2020-04-28 MED ORDER — KETOROLAC TROMETHAMINE 30 MG/ML IJ SOLN
30.0000 mg | Freq: Once | INTRAMUSCULAR | Status: AC
  Administered 2020-04-28: 30 mg via INTRAVENOUS
  Filled 2020-04-28: qty 1

## 2020-04-28 MED ORDER — IOHEXOL 350 MG/ML SOLN
75.0000 mL | Freq: Once | INTRAVENOUS | Status: AC | PRN
  Administered 2020-04-28: 60 mL via INTRAVENOUS

## 2020-04-28 MED ORDER — INSULIN ASPART 100 UNIT/ML ~~LOC~~ SOLN
6.0000 [IU] | Freq: Three times a day (TID) | SUBCUTANEOUS | Status: DC
  Administered 2020-04-29: 6 [IU] via SUBCUTANEOUS
  Filled 2020-04-28: qty 1

## 2020-04-28 MED ORDER — SODIUM CHLORIDE 0.9 % IV SOLN
1000.0000 mL | Freq: Once | INTRAVENOUS | Status: AC
  Administered 2020-04-28: 1000 mL via INTRAVENOUS

## 2020-04-28 MED ORDER — QUETIAPINE FUMARATE 300 MG PO TABS
500.0000 mg | ORAL_TABLET | Freq: Every day | ORAL | Status: DC
  Administered 2020-04-28 – 2020-05-01 (×4): 500 mg via ORAL
  Filled 2020-04-28 (×5): qty 1

## 2020-04-28 MED ORDER — INSULIN ASPART 100 UNIT/ML ~~LOC~~ SOLN
0.0000 [IU] | Freq: Every day | SUBCUTANEOUS | Status: DC
  Administered 2020-04-28: 3 [IU] via SUBCUTANEOUS
  Administered 2020-04-30: 22:00:00 2 [IU] via SUBCUTANEOUS
  Filled 2020-04-28 (×3): qty 1

## 2020-04-28 MED ORDER — DEXAMETHASONE SODIUM PHOSPHATE 10 MG/ML IJ SOLN
10.0000 mg | Freq: Once | INTRAMUSCULAR | Status: AC
  Administered 2020-04-28: 10 mg via INTRAVENOUS
  Filled 2020-04-28: qty 1

## 2020-04-28 MED ORDER — CITALOPRAM HYDROBROMIDE 20 MG PO TABS
20.0000 mg | ORAL_TABLET | Freq: Every day | ORAL | Status: DC
  Administered 2020-04-28 – 2020-05-02 (×5): 20 mg via ORAL
  Filled 2020-04-28 (×5): qty 1

## 2020-04-28 NOTE — ED Notes (Signed)
Pt assisted up to restroom with this RN and Brittney NT

## 2020-04-28 NOTE — ED Notes (Signed)
Patient is a difficult stick, another RN and myself attempted to draw blood

## 2020-04-28 NOTE — ED Triage Notes (Signed)
Reports weakness, dizziness over last few days with multiple falls. C/o right hip pain since last fall. Pt alert and oriented X4, cooperative, RR even and unlabored, color WNL. Pt in NAD.

## 2020-04-28 NOTE — ED Notes (Signed)
Attempted to call IV consult team, no answer

## 2020-04-28 NOTE — Telephone Encounter (Signed)
Pt. called to report she feels off balance and has fallen x 3 recently.  C/o slurring of words, easily confused, headache, and numbness on right side of body.  Is home alone.  Advised that EMS should be called.  Call dropped.  Able to get pt. back on phone within approx. 2 min.  Encouraged pt. to lay down and wait on EMS; called 911 and connected Operator to the patient. Will forward note to the PCP.     Reason for Disposition . [1] Numbness (i.e., loss of sensation) of the face, arm / hand, or leg / foot on one side of the body AND [2] sudden onset AND [3] present now  Answer Assessment - Initial Assessment Questions 1. SYMPTOM: "What is the main symptom you are concerned about?" (e.g., weakness, numbness)     Feels off balance 2. ONSET: "When did this start?" (minutes, hours, days; while sleeping)     About 3 days ago 3. LAST NORMAL: "When was the last time you were normal (no symptoms)?"     3 days ago 4. PATTERN "Does this come and go, or has it been constant since it started?"  "Is it present now?"     *No Answer* 5. CARDIAC SYMPTOMS: "Have you had any of the following symptoms: chest pain, difficulty breathing, palpitations?"     *No Answer* 6. NEUROLOGIC SYMPTOMS: "Have you had any of the following symptoms: headache, dizziness, vision loss, double vision, changes in speech, unsteady on your feet?"     Slurring of words, off balance, has fallen x 3 recently, c/o headache (7/10), right arm numb/ weak  7. OTHER SYMPTOMS: "Do you have any other symptoms?"     *No Answer* 8. PREGNANCY: "Is there any chance you are pregnant?" "When was your last menstrual period?"     *No Answer*  Protocols used: NEUROLOGIC DEFICIT-A-AH

## 2020-04-28 NOTE — H&P (Signed)
[facility] History and Physical  [name] OJJ:[id number] DOB: December 22, 1965 DOA: 04/28/2020  Referring physician: Dr. Ellender Hose PCP: Steele Sizer, MD   Chief Complaint: cough  HPI: [name] is a 54 y.o. female with history of COPD on 1 to 2 L home oxygen, CKD, hypertension, OSA, bipolar, [place], obesity, who presents with cough and weakness.  Patient presents to the ED with 2-day history of feeling weak, dizzy, and having leg pain.  She states that the symptoms started approximately 2 days ago.  She reports she has fallen 3 times she denies head strike.  Patient unable to give a good history for dizziness, states that it is not all the time, but worse when she is walking.  Not worse when she rolls over in bed.  Leg pain is bilateral tingly, started 2 days prior.  She is normally on 2 L of home oxygen, has also had a cough that has been nonproductive, has also felt a little more short of breath.  She did not receive her Covid vaccine.  In the ED initial vital signs notable for mildly low blood pressure 102/57 but patient satting 90% and above on home O2 of 2 L while at rest, though she did desaturate with ambulation.  Lab work-up notable for BMP showing AKI with creatinine increased from 1.3 on last check to 1.8, hyperglycemia with glucose 247, CBC showing leukopenia with WBC decrease from 8.6-3.3 and stable anemia with hemoglobin 11.  Covid test was obtained which was positive.  Lactic acid and procalcitonin were normal, D-dimer was mildly elevated at 653, UA with significant glucosuria but not suspicious for infection.  Due to history of falls patient underwent a CT head which showed no acute abnormality, as well as a CTPA which was negative for PE but did show bilateral groundglass opacities consistent with Covid pneumonia.  EKG was obtained which did not show any ischemic changes.  As patient initially thought to have pneumonia she was given 1 dose of azithromycin.  Once Covid returned  positive and imaging findings were obtained, she was started on Decadron and remdesivir in the emergency department as well as being given a 1 L normal saline bolus.  Patient was admitted for further management of her AKI in the setting of Covid pneumonia.  Review of Systems:  Pertinent positives and negative per HPI, all others reviewed and negative   Past Medical History:  Diagnosis Date  . Anxiety   . Arthritis   . Asthma   . Bipolar depression ([place])   . CHF (congestive heart failure) ([place])   . Chronic headaches    migraines have lessened  . Chronic kidney disease    ddstage 111  . COPD (chronic obstructive pulmonary disease) ([place])   . Coronary artery disease   . Depression   . Diabetic neuropathy ([place])   . Dyspnea    uses inhalers for this  . Dysrhythmia    rapid  . Fatty liver   . GERD (gastroesophageal reflux disease)   . Headache   . Heart attack ([place]) 2009  . Hyperlipidemia   . Hypertension   . Hypomagnesemia   . Hypothyroid   . Irregular heart rhythm   . Liver lesion    Favored to be benign on CT; MRI of abd w/contract in Aug 2016. Liver Biopsy-Negative, March 2016  . Morbid obesity ([place])   . OSA (obstructive sleep apnea)   . Peripheral vascular disease ([place])   . Pinched nerve    Back  .  Pneumonia   . RLS (restless legs syndrome)   . Seasonal allergies   . Sleep apnea    uses oxygen at night 2L d/t cpap did not fit  . Type 2 diabetes, uncontrolled, with neuropathy ([place]) 10/15/2015   Overview:  Microalbumin 7.7 12/2010: Hgb A1c 10.2% 12/2010 and 9.7% 03/2011, eye exam 10/2009 [facility] clinic   . Urinary incontinence   . Vitamin D deficiency    Past Surgical History:  Procedure Laterality Date  . ABDOMINAL HYSTERECTOMY     Partial  . bladder stim  2007  . BLADDER SURGERY     x 2. tacking of bladder and the other was the stimulator placement  . CHOLECYSTECTOMY    . COLONOSCOPY WITH PROPOFOL N/A 03/04/2019   Procedure: COLONOSCOPY WITH PROPOFOL;  Surgeon:  [name], MD;  Location: [facility] ENDOSCOPY;  Service: Endoscopy;  Laterality: N/A;  . ESOPHAGOGASTRODUODENOSCOPY (EGD) WITH PROPOFOL N/A 03/04/2019   Procedure: ESOPHAGOGASTRODUODENOSCOPY (EGD) WITH PROPOFOL;  Surgeon: [name], MD;  Location: [facility] ENDOSCOPY;  Service: Endoscopy;  Laterality: N/A;  . INCISION AND DRAINAGE ABSCESS Right 04/16/2019   Procedure: INCISION AND DRAINAGE ABSCESS;  Surgeon: Benjamine Sprague, DO;  Location: [facility] ORS;  Service: General;  Laterality: Right;  . INTERSTIM-GENERATOR CHANGE N/A 12/31/2017   Procedure: INTERSTIM-GENERATOR CHANGE;  Surgeon: Bjorn Loser, MD;  Location: [facility] ORS;  Service: Urology;  Laterality: N/A;  . LIVER BIOPSY  March 2016   Negative  . LUNG BIOPSY  2015   small spot on lung  . TUBAL LIGATION    . UPPER GI ENDOSCOPY    . WRIST SURGERY Right 11/02/2019   Social History:  reports that she has never smoked. She has never used smokeless tobacco. She reports that she does not drink alcohol and does not use drugs.  Allergies  Allergen Reactions  . Mirtazapine Anaphylaxis    REACTION: closes throat  . Morphine And Related     anaphylaxis  . Sulfamethoxazole-Trimethoprim Rash    REACTION: Whelps all over  . Amoxicillin Er Nausea And Vomiting    Has patient had a PCN reaction causing immediate rash, facial/tongue/throat swelling, SOB or lightheadedness with hypotension: No Has patient had a PCN reaction causing severe rash involving mucus membranes or skin necrosis: No Has patient had a PCN reaction that required hospitalization: No Has patient had a PCN reaction occurring within the last 10 years: Yes If all of the above answers are "NO", then may proceed with Cephalosporin use.  . Codeine Itching    REACTION: itch, rash  . Hydrocodone-Acetaminophen Rash    REACTION: rash  . Keflex [Cephalexin] Rash  . Prasterone Nausea And Vomiting    Family History  Problem Relation Age of Onset  . Diabetes Mother   . Heart disease Mother     . Hyperlipidemia Mother   . Hypertension Mother   . Kidney disease Mother   . Mental illness Mother   . Hypothyroidism Mother   . Stroke Mother   . Osteoporosis Mother   . Glaucoma Mother   . Congestive Heart Failure Mother   . Hypertension Father   . Asthma Daughter   . Cancer Daughter        throat  . Bipolar disorder Daughter   . Heart disease Maternal Grandfather   . Rheum arthritis Maternal Grandfather   . Cancer Maternal Grandfather        liver  . Kidney disease Maternal Grandfather   . Cancer Paternal Grandmother  lung  . Kidney disease Brother   . Breast cancer Maternal Grandmother 60  . Bipolar disorder Daughter   . Hypertension Daughter   . Restless legs syndrome Daughter     Prior to Admission medications   Medication Sig Start Date End Date Taking? Authorizing Provider  albuterol (VENTOLIN HFA) 108 (90 Base) MCG/ACT inhaler Inhale 2 puffs into the lungs every 6 (six) hours as needed for wheezing or shortness of breath. 08/12/19   Hubbard Hartshorn, FNP  atorvastatin (LIPITOR) 80 MG tablet Take 80 mg by mouth at bedtime.     [provider]  benzonatate (TESSALON) 100 MG capsule Take 1-2 capsules (100-200 mg total) by mouth 2 (two) times daily as needed. 01/26/20   Steele Sizer, MD  busPIRone (BUSPAR) 10 MG tablet Take 20 mg by mouth 2 (two) times daily.     [provider]  citalopram (CELEXA) 20 MG tablet Take 20 mg by mouth daily. 01/16/20   [provider]  empagliflozin (JARDIANCE) 25 MG TABS tablet Take 25 mg by mouth daily.    [provider]  ezetimibe (ZETIA) 10 MG tablet Take 10 mg by mouth daily.  01/05/17   [provider]  fenofibrate micronized (LOFIBRA) 134 MG capsule Take 134 mg by mouth daily.    [provider]  fluticasone (FLONASE) 50 MCG/ACT nasal spray Place 2 sprays into both nostrils daily. 01/26/20   Steele Sizer, MD  gabapentin (NEURONTIN) 300 MG capsule Take 1-2 capsules (300-600  mg total) by mouth 3 (three) times daily. Take 1 capsule each morning, 1 capsule each afternoon, and 2 capsules at bedtime. 03/17/20   Steele Sizer, MD  HUMULIN R U-500 KWIKPEN 500 UNIT/ML kwikpen Inject 70 Units as directed 3 (three) times daily.     [provider]  insulin aspart (NOVOLOG) 100 UNIT/ML injection Inject 5-25 Units into the skin 3 (three) times daily before meals.     [provider]  lamoTRIgine (LAMICTAL) 200 MG tablet Take 200 mg by mouth 2 (two) times daily.     [provider]  levothyroxine (SYNTHROID) 50 MCG tablet TAKE ONE TABLET DAILY 3 DAYS A WEEK, THEN TAKE ONE AND ONE-HALF TABLETS FOUR DAYS A WEEK 12/23/19   Hubbard Hartshorn, FNP  lisinopril (ZESTRIL) 2.5 MG tablet TAKE 1 TABLET BY MOUTH EVERY DAY 01/06/20   Ancil Boozer, Drue Stager, MD  meclizine (ANTIVERT) 12.5 MG tablet Take 1 tablet (12.5 mg total) by mouth 3 (three) times daily as needed for dizziness. 11/05/19   Towanda Malkin, MD  metoprolol tartrate (LOPRESSOR) 25 MG tablet Take 0.5-1 tablets (12.5-25 mg total) by mouth 2 (two) times daily. TAKE 1 TABLET (25MG TOTAL) IN THE MORNING AND 1/2 TABLET (12.5 mg) IN THE EVENING 03/02/20   Sowles, Drue Stager, MD  Omega-3 Fatty Acids (FISH OIL PO) Take by mouth daily.    [provider]  ondansetron (ZOFRAN) 4 MG tablet TAKE 1 TABLET BY MOUTH EVERY 8 HOURS AS NEEDED FOR NAUSEA OR VOMITING Patient taking differently: Take 4 mg by mouth every 8 (eight) hours as needed for nausea or vomiting.  09/15/19   [name], MD  [facility] ([facility] VERIO test strip  08/18/19   [provider]  pantoprazole (PROTONIX) 40 MG tablet Take 1 tablet (40 mg total) by mouth daily. 08/20/19   [name], MD  polyethylene glycol (MIRALAX) 17 g packet Take 17 g by mouth daily. Patient taking differently: Take 17 g by mouth daily as needed for mild constipation.  10/27/19   Nance Pear, MD  QUEtiapine (SEROQUEL) 300 MG tablet Take 300 mg by mouth at bedtime. 11/18/19    [provider]  traMADol (ULTRAM) 50 MG tablet Take 50 mg by mouth every 6 (six) hours as needed. 08/12/19   [provider]  traZODone (DESYREL) 100 MG tablet Take 100 mg by mouth at bedtime.    [provider]  TRULICITY 1.5 HB/7.1IR SOPN Inject 1.5 mLs as directed every Sunday.  03/26/18   [provider]  ULTICARE SHORT PEN NEEDLES 31G X 8 MM [place]  09/10/19   [provider]   Physical Exam: Vitals:   04/28/20 0929  BP: (!) 102/57  Pulse: 83  Resp: 18  Temp: 99.6 F (37.6 C)  TempSrc: Oral  SpO2: 94%  Weight: 86 kg  Height: 4' 11"  (1.499 m)    Wt Readings from Last 3 Encounters:  04/28/20 86 kg  03/31/20 86.2 kg  01/31/20 86.2 kg    General:  Appears calm and comfortable Eyes: PERRL, normal lids, irises & conjunctiva ENT: grossly normal hearing, lips & tongue Neck: no masses Cardiovascular: RRR, no m/r/g. No LE edema.  Respiratory: CTA bilaterally, no w/r/r. Normal respiratory effort. Abdomen: soft, ntnd Skin: no rash or induration seen on limited exam Musculoskeletal: grossly normal tone BUE/BLE Psychiatric: grossly normal mood and affect, speech fluent and appropriate Neurologic: grossly non-focal.          Labs on Admission:  Basic Metabolic Panel: Recent Labs  Lab 04/28/20 0931  NA 135  K 4.6  CL 99  CO2 22  GLUCOSE 247*  BUN 35*  CREATININE 1.82*  CALCIUM 8.8*   Liver Function Tests: No results for input(s): AST, ALT, ALKPHOS, BILITOT, PROT, ALBUMIN in the last 168 hours. No results for input(s): LIPASE, AMYLASE in the last 168 hours. No results for input(s): AMMONIA in the last 168 hours. CBC: Recent Labs  Lab 04/28/20 0931  WBC 3.4*  3.3*  NEUTROABS 2.0  HGB 11.0*  11.0*  HCT 35.3*  35.1*  MCV 93.9  92.1  PLT 162  167   Cardiac Enzymes: No results for input(s): CKTOTAL, CKMB, CKMBINDEX, TROPONINI in the last 168 hours.  BNP (last 3 results) No results for input(s): BNP in the last 8760  hours.  ProBNP (last 3 results) No results for input(s): PROBNP in the last 8760 hours.  CBG: No results for input(s): GLUCAP in the last 168 hours.  Radiological Exams on Admission: DG Chest 2 View  Result Date: 04/28/2020 CLINICAL DATA:  Weakness, dizziness EXAM: CHEST - 2 VIEW COMPARISON:  11/02/2019 FINDINGS: Low lung volumes. Unchanged mild elevation of the right hemidiaphragm. Focal increased basilar density on the lateral view. No pleural effusion. No pneumothorax. Mild cardiomegaly. No acute osseous abnormality. Cholecystectomy clips. IMPRESSION: There is focal increased basilar density on the lateral view likely reflecting atelectasis/consolidation. Not definitely seen on the multiple view. Electronically Signed   By: Macy Mis M.D.   On: 04/28/2020 14:32   DG Lumbar Spine 2-3 Views  Result Date: 04/28/2020 CLINICAL DATA:  Pain following fall EXAM: LUMBAR SPINE - 2-3 VIEW COMPARISON:  Jan 16, 2017. FINDINGS: Frontal, lateral, and spot lumbosacral lateral images were obtained. There are 5 non-rib-bearing lumbar type vertebral bodies. There is no fracture or spondylolisthesis. The disc spaces appear normal. There is facet osteoarthritic change at L5-S1 bilaterally. There is a stimulator on the right with the lead tip anterior to the lower sacrum. Surgical clips in right upper quadrant.  IMPRESSION: Osteoarthritic change in the facets at L5-S1 bilaterally. No appreciable disc space narrowing. No fracture or spondylolisthesis. Stable stimulator lead positioning. Electronically Signed   By: Lowella Grip III M.D.   On: 04/28/2020 14:38   CT Head Wo Contrast  Result Date: 04/28/2020 CLINICAL DATA:  Dizziness, reported weakness over the past few days multiple falls. EXAM: CT HEAD WITHOUT CONTRAST TECHNIQUE: Contiguous axial images were obtained from the base of the skull through the vertex without intravenous contrast. COMPARISON:  November 02, 2019 FINDINGS: Brain: No evidence of acute  infarction, hemorrhage, hydrocephalus, extra-axial collection or mass lesion/mass effect. Signs of atrophy and chronic microvascular ischemic change as before. Vascular: No hyperdense vessel or unexpected calcification. Skull: Normal. Negative for fracture or focal lesion. Sinuses/Orbits: Mild sphenoid sinus disease on the LEFT and scattered ethmoid opacification. Sinuses are incompletely imaged. Orbits, incompletely imaged without acute finding within visualized portions. Other: None. IMPRESSION: 1. No acute intracranial abnormality. 2. Signs of atrophy and chronic microvascular ischemic change as before. 3. Mild sphenoid sinus disease and scattered ethmoid opacification. Sinuses are incompletely imaged. Electronically Signed   By: Zetta Bills M.D.   On: 04/28/2020 14:06   CT Angio Chest PE W and/or Wo Contrast  Result Date: 04/28/2020 CLINICAL DATA:  PE suspected, high probability with aches and shortness of breath and elevated D-dimer in the setting of COVID-19 infection EXAM: CT ANGIOGRAPHY CHEST WITH CONTRAST TECHNIQUE: Multidetector CT imaging of the chest was performed using the standard protocol during bolus administration of intravenous contrast. Multiplanar CT image reconstructions and MIPs were obtained to evaluate the vascular anatomy. CONTRAST:  64m OMNIPAQUE IOHEXOL 350 MG/ML SOLN COMPARISON:  May 27, 2017 FINDINGS: Cardiovascular: Aortic caliber is normal. Heart size is normal without pericardial effusion. Scattered aortic atherosclerosis. No pulmonary embolism. Mediastinum/Nodes: Thoracic inlet structures are normal. No axillary lymphadenopathy. Mildly enlarged mediastinal lymph nodes, generalized, also associated with bilateral hilar nodal prominence. Subcarinal lymph node (image 42, series 4 8 mm. LEFT suprahilar lymph node 9 mm (image 31, series 4) Scattered, similarly enlarged RIGHT paratracheal lymph nodes and superior mediastinal lymph nodes. Lungs/Pleura: Bilateral ground-glass  opacities throughout the chest with peripheral predominance. No effusion. All areas of the parenchyma are involved. Upper Abdomen: Hepatic steatosis. Post cholecystectomy. No acute upper abdominal process to the extent evaluated. Musculoskeletal: No acute musculoskeletal finding. No destructive bone process. Review of the MIP images confirms the above findings. IMPRESSION: 1. No pulmonary embolism.  Study is mildly limited by bolus timing. 2. Bilateral ground-glass opacities throughout the chest with peripheral predominance. Findings are consistent with the provided history of COVID-19 infection. 3. Mildly enlarged mediastinal and hilar lymph nodes, likely reactive. 4. Hepatic steatosis. 5. Aortic atherosclerosis. Aortic Atherosclerosis (ICD10-I70.0). Electronically Signed   By: GZetta BillsM.D.   On: 04/28/2020 18:42    EKG: Independently reviewed.  Sinus rhythm, left axis deviation, no ischemic changes.  Assessment/Plan Active Problems:   HYPERTENSION, BENIGN ESSENTIAL   INTERSTITIAL CYSTITIS   Obstructive sleep apnea   Hypothyroid   COPD (chronic obstructive pulmonary disease) (HCC)   Chronic kidney disease, stage III (moderate)   Heart valve disease   Bipolar disorder (HCC)   NASH (nonalcoholic steatohepatitis)   Morbid obesity (HCC)   Type 2 diabetes, uncontrolled, with neuropathy ([place]   AKI [place] kidney injury) ([place]   #AKI #Acute on chronic CKD Patient with CKD with baseline creatinine of 1.1-1.3, currently 1.8 on admission.  Likely prerenal in the setting of Covid pneumonia, status post 2 L bolus in  the ED. -Fluid resuscitation -Trend BMP  #COVID Pneumonia Patient currently on 3 L O2 on my examination in the room, slightly above her baseline of 2 L.  Multiple comorbidities she is at high risk for decompensation, will treat per protocol. -Decadron per protocol -Remdesivir per pharmacy -Maintain O2 saturations above 88% with supplemental oxygen -Trend Covid labs daily -Low  threshold for Actemra if oxygen requirements escalate  #DM2 Last A1c 10.3% on July 28.  Current home regimen is Humulin 70 units 3 times daily along with aspart 5 to 25 units 3 times daily with meals.  We will do weight-based insulin dosing while here and consult diabetes. -Consult diabetes coordinator -Hold oral hypoglycemics (Jardiance) -Lantus 20 units nightly -Aspart 6 units 3 times daily with meals -Aspart insulin sliding scale  #Dizziness Review of chart shows this is a longstanding problem, last evaluated thoroughly in March of this year.  At that time provider thought most likely vertigo, but made referral to neurology.  Recommend outpatient follow-up for this chronic issue.  #Leukopenia Likely secondary to Covid infection, will add on differential.  #Chronic medical problems Hyperlipidemia continue atorvastatin, Zetia, Arnell Asal Anxiety/depression: Continue BuSpar, citalopram Neuropathy: Continue gabapentin Bipolar: Continue Lamictal, Seroquel (note patient reports dose recently increased from 300 to 500 mg), trazodone Hypothyroidism: Continue levothyroxine Hypertension: Continue lisinopril, Lopressor GERD: Continue PPI  Code Status: Full Code DVT Prophylaxis: Lovenox Family Communication: none Disposition Plan: Inpatient  Time spent: 50 min  Clarnce Flock MD/MPH [facility]

## 2020-04-28 NOTE — ED Provider Notes (Signed)
Assumed care from Dr. Corky Downs at 3 PM. Briefly, the patient is a 54 y.o. female with PMHx of  has a past medical history of Anxiety, Arthritis, Asthma, Bipolar depression ([place]), CHF (congestive heart failure) ([place]), Chronic headaches, Chronic kidney disease, COPD (chronic obstructive pulmonary disease) ([place]), Coronary artery disease, Depression, Diabetic neuropathy ([place]), Dyspnea, Dysrhythmia, Fatty liver, GERD (gastroesophageal reflux disease), Headache, Heart attack ([place]) (2009), Hyperlipidemia, Hypertension, Hypomagnesemia, Hypothyroid, Irregular heart rhythm, Liver lesion, Morbid obesity ([place]), OSA (obstructive sleep apnea), Peripheral vascular disease ([place]), Pinched nerve, Pneumonia, RLS (restless legs syndrome), Seasonal allergies, Sleep apnea, Type 2 diabetes, uncontrolled, with neuropathy ([place]) (10/15/2015), Urinary incontinence, and Vitamin D deficiency. here with cough, weakness. Suspect COVID-19 vs PNA. Labs show mild dehydration, no evidence of sepsis. CXR concerning for possible PNA. Pt not hypoxic. IVF, ABX ordered.   Labs Reviewed  BASIC METABOLIC PANEL - Abnormal; Notable for the following components:      Result Value   Glucose, Bld 247 (*)    BUN 35 (*)    Creatinine, Ser 1.82 (*)    Calcium 8.8 (*)    GFR calc non Af Amer 31 (*)    GFR calc Af Amer 35 (*)    All other components within normal limits  CBC - Abnormal; Notable for the following components:   WBC 3.3 (*)    RBC 3.81 (*)    Hemoglobin 11.0 (*)    HCT 35.1 (*)    All other components within normal limits  SARS CORONAVIRUS 2 BY RT PCR (HOSPITAL ORDER, [place] [facility])  URINALYSIS, COMPLETE (UACMP) WITH MICROSCOPIC    Course of Care: -COVID positive. Pt did not receive vaccination. Labs otherwise positive for dehydration. IVF, Decadron, Azithro given and will monitor. -Pt with significant SOB, weakness, falls in setting of COVID-19. On 2L at baseline. Will admit for AKI, weakness, mild acute  worsening of chronic hypoxia.    Duffy Bruce, MD 04/28/20 Kathyrn Drown

## 2020-04-28 NOTE — ED Notes (Signed)
Attempt to call report, Willicent RN will call me back

## 2020-04-29 DIAGNOSIS — J9601 Acute respiratory failure with hypoxia: Secondary | ICD-10-CM

## 2020-04-29 DIAGNOSIS — U071 COVID-19: Principal | ICD-10-CM

## 2020-04-29 LAB — COMPREHENSIVE METABOLIC PANEL
ALT: 58 U/L — ABNORMAL HIGH (ref 0–44)
AST: 98 U/L — ABNORMAL HIGH (ref 15–41)
Albumin: 3.2 g/dL — ABNORMAL LOW (ref 3.5–5.0)
Alkaline Phosphatase: 41 U/L (ref 38–126)
Anion gap: 16 — ABNORMAL HIGH (ref 5–15)
BUN: 48 mg/dL — ABNORMAL HIGH (ref 6–20)
CO2: 18 mmol/L — ABNORMAL LOW (ref 22–32)
Calcium: 7.7 mg/dL — ABNORMAL LOW (ref 8.9–10.3)
Chloride: 106 mmol/L (ref 98–111)
Creatinine, Ser: 1.8 mg/dL — ABNORMAL HIGH (ref 0.44–1.00)
GFR calc Af Amer: 36 mL/min — ABNORMAL LOW (ref >60)
GFR calc non Af Amer: 31 mL/min — ABNORMAL LOW (ref >60)
Glucose, Bld: 290 mg/dL — ABNORMAL HIGH (ref 70–99)
Potassium: 5 mmol/L (ref 3.5–5.1)
Sodium: 140 mmol/L (ref 135–145)
Total Bilirubin: 2.2 mg/dL — ABNORMAL HIGH (ref 0.3–1.2)
Total Protein: 5.8 g/dL — ABNORMAL LOW (ref 6.5–8.1)

## 2020-04-29 LAB — CBC WITH DIFFERENTIAL/PLATELET
Abs Immature Granulocytes: 0.06 10*3/uL (ref 0.00–0.07)
Basophils Absolute: 0 10*3/uL (ref 0.0–0.1)
Basophils Relative: 0 %
Eosinophils Absolute: 0 10*3/uL (ref 0.0–0.5)
Eosinophils Relative: 0 %
HCT: 31.8 % — ABNORMAL LOW (ref 36.0–46.0)
Hemoglobin: 9.8 g/dL — ABNORMAL LOW (ref 12.0–15.0)
Immature Granulocytes: 2 %
Lymphocytes Relative: 39 %
Lymphs Abs: 1.1 10*3/uL (ref 0.7–4.0)
MCH: 28.9 pg (ref 26.0–34.0)
MCHC: 30.8 g/dL (ref 30.0–36.0)
MCV: 93.8 fL (ref 80.0–100.0)
Monocytes Absolute: 0.2 10*3/uL (ref 0.1–1.0)
Monocytes Relative: 7 %
Neutro Abs: 1.5 10*3/uL — ABNORMAL LOW (ref 1.7–7.7)
Neutrophils Relative %: 52 %
Platelets: 137 10*3/uL — ABNORMAL LOW (ref 150–400)
RBC: 3.39 MIL/uL — ABNORMAL LOW (ref 3.87–5.11)
RDW: 15 % (ref 11.5–15.5)
WBC: 2.9 10*3/uL — ABNORMAL LOW (ref 4.0–10.5)
nRBC: 0 % (ref 0.0–0.2)

## 2020-04-29 LAB — GLUCOSE, CAPILLARY
Glucose-Capillary: 324 mg/dL — ABNORMAL HIGH (ref 70–99)
Glucose-Capillary: 260 mg/dL — ABNORMAL HIGH (ref 70–99)
Glucose-Capillary: 178 mg/dL — ABNORMAL HIGH (ref 70–99)

## 2020-04-29 LAB — C-REACTIVE PROTEIN: CRP: 4.5 mg/dL — ABNORMAL HIGH (ref <1.0)

## 2020-04-29 LAB — FERRITIN: Ferritin: 654 ng/mL — ABNORMAL HIGH (ref 11–307)

## 2020-04-29 LAB — FIBRIN DERIVATIVES D-DIMER (ARMC ONLY): Fibrin derivatives D-dimer (ARMC): 608.84 ng/mL (FEU) — ABNORMAL HIGH (ref 0.00–499.00)

## 2020-04-29 LAB — MAGNESIUM: Magnesium: 2.6 mg/dL — ABNORMAL HIGH (ref 1.7–2.4)

## 2020-04-29 LAB — PHOSPHORUS: Phosphorus: 6.6 mg/dL — ABNORMAL HIGH (ref 2.5–4.6)

## 2020-04-29 MED ORDER — ACETAMINOPHEN 325 MG PO TABS
650.0000 mg | ORAL_TABLET | Freq: Four times a day (QID) | ORAL | Status: DC | PRN
  Administered 2020-04-29: 650 mg via ORAL
  Filled 2020-04-29: qty 2

## 2020-04-29 MED ORDER — ONDANSETRON HCL 4 MG/2ML IJ SOLN: 4.0000 mg | Freq: Four times a day (QID) | INTRAMUSCULAR | Status: DC | PRN

## 2020-04-29 MED ORDER — INSULIN REGULAR HUMAN (CONC) 500 UNIT/ML ~~LOC~~ SOLN: 40.0000 [IU] | Freq: Three times a day (TID) | SUBCUTANEOUS | Status: DC

## 2020-04-29 MED ORDER — ENOXAPARIN SODIUM 40 MG/0.4ML ~~LOC~~ SOLN
40.0000 mg | SUBCUTANEOUS | Status: DC
  Administered 2020-04-29 – 2020-05-01 (×3): 40 mg via SUBCUTANEOUS
  Filled 2020-04-29 (×3): qty 0.4

## 2020-04-29 MED ORDER — LEVOTHYROXINE SODIUM 50 MCG PO TABS
50.0000 ug | ORAL_TABLET | ORAL | Status: DC
  Administered 2020-04-30: 05:00:00 50 ug via ORAL
  Filled 2020-04-29: qty 1

## 2020-04-29 MED ORDER — LEVOTHYROXINE SODIUM 50 MCG PO TABS
75.0000 ug | ORAL_TABLET | ORAL | Status: DC
  Administered 2020-04-29 – 2020-05-02 (×3): 75 ug via ORAL
  Filled 2020-04-29 (×3): qty 1

## 2020-04-29 MED ORDER — METHOCARBAMOL 500 MG PO TABS
500.0000 mg | ORAL_TABLET | Freq: Three times a day (TID) | ORAL | Status: DC | PRN
  Administered 2020-04-29: 19:00:00 500 mg via ORAL
  Filled 2020-04-29 (×3): qty 1

## 2020-04-29 MED ORDER — GABAPENTIN 300 MG PO CAPS
300.0000 mg | ORAL_CAPSULE | Freq: Two times a day (BID) | ORAL | Status: DC
  Administered 2020-04-29 – 2020-05-02 (×8): 300 mg via ORAL
  Filled 2020-04-29 (×7): qty 1

## 2020-04-29 MED ORDER — POLYETHYLENE GLYCOL 3350 17 G PO PACK: 17.0000 g | PACK | Freq: Every day | ORAL | Status: DC | PRN

## 2020-04-29 MED ORDER — SODIUM CHLORIDE 0.9 % IV BOLUS
500.0000 mL | Freq: Once | INTRAVENOUS | Status: AC
  Administered 2020-04-29: 12:00:00 500 mL via INTRAVENOUS

## 2020-04-29 MED ORDER — ONDANSETRON HCL 4 MG PO TABS: 4.0000 mg | ORAL_TABLET | Freq: Four times a day (QID) | ORAL | Status: DC | PRN

## 2020-04-29 MED ORDER — INSULIN REGULAR HUMAN (CONC) 500 UNIT/ML ~~LOC~~ SOPN
15.0000 [IU] | PEN_INJECTOR | Freq: Three times a day (TID) | SUBCUTANEOUS | Status: DC
  Administered 2020-04-30 (×2): 15 [IU] via SUBCUTANEOUS
  Filled 2020-04-29: qty 3

## 2020-04-29 MED ORDER — ACETAMINOPHEN 325 MG PO TABS
650.0000 mg | ORAL_TABLET | Freq: Four times a day (QID) | ORAL | Status: DC | PRN
  Administered 2020-04-29: 13:00:00 650 mg via ORAL
  Filled 2020-04-29: qty 2

## 2020-04-29 MED ORDER — GABAPENTIN 300 MG PO CAPS
600.0000 mg | ORAL_CAPSULE | Freq: Every day | ORAL | Status: DC
  Administered 2020-04-29 – 2020-05-01 (×3): 600 mg via ORAL
  Filled 2020-04-29 (×3): qty 2

## 2020-04-29 MED ORDER — LACTATED RINGERS IV SOLN: INTRAVENOUS | Status: DC

## 2020-04-29 NOTE — Progress Notes (Signed)
Inpatient Diabetes Program Recommendations  AACE/ADA: New Consensus Statement on Inpatient Glycemic Control (2015)  Target Ranges:  Prepandial:   less than 140 mg/dL      Peak postprandial:   less than 180 mg/dL (1-2 hours)      Critically ill patients:  140 - 180 mg/dL   Lab Results  Component Value Date   GLUCAP 273 (H) 04/28/2020   HGBA1C 10.3 (H) 03/31/2020    Review of Glycemic Control Results for [name], [name] (MRN [id number]) as of 04/29/2020 11:27  Ref. Range 04/28/2020 23:00  Glucose-Capillary Latest Ref Range: 70 - 99 mg/dL 273 (H)   Diabetes history: DM2 Outpatient Diabetes medications: Humulin R U 500 70 units tid + Jardiance 25 mg qd + Novolog 5-25 units tid meal coverage + Trulicity qweek Current orders for Inpatient glycemic control: Lantus 20 units + Novolog 6 units tid meal coverage + Novolog resistant correction 0-20 units tid + hs 0-5 units + Decadron 6 mg q d  Inpatient Diabetes Program Recommendations:   Patient missed CBG & Novolog correction this am per RN Laqueta Jean & will check and administer insulin correction & meal coverage @ noon. Requested RN to go ahead and check CBG as soon as possible.  When eating 50% regular amount of meals. -consider restarting Humulin R U 500 insulin 15 units tid and D/C Lantus dose  Spoke with patient via phone and clarified patient has been taking her prescribed insulin doses @ home.  Thank you, Nani Gasser. Travoris Bushey, RN, MSN, CDE  Diabetes Coordinator Inpatient Glycemic Control Team Team Pager [phone] (8am-5pm) 04/29/2020 11:40 AM

## 2020-04-29 NOTE — TOC Initial Note (Signed)
Transition of Care [facility]) - Initial/Assessment Note    Patient Details  Name: [name] MRN: [id number] Date of Birth: Jun 03, 1966  Transition of [facility]) CM/SW Contact:    Shelbie Hutching, RN Phone Number: 04/29/2020, 9:50 AM  Clinical Narrative:                 Patient admitted to the hospital with COVID, patient has a history of COPD and requires oxygen at night.  RNCM attempted to call into the patient's room to speak with her but there was no answer.  RNCM was able to reach the patient's husband, Christia Reading, via phone.  Patient lives at home with her husband, he reports that most of the time she is independent but sometimes requires minimal assistance.  Patient has no equipment at home and has not needed any.  Patient does drive and so does Timothy.  Christia Reading will pick up patient at discharge.  Patient is current with her PCP and uses [facility] for prescriptions.  TOC team will follow for needs.  Expected Discharge Plan: Home/Self Care Barriers to Discharge: Continued Medical Work up   Patient Goals and CMS Choice        Expected Discharge Plan and Services Expected Discharge Plan: Home/Self Care   Discharge Planning Services: CM Consult   Living arrangements for the past 2 months: Mobile Home                                      Prior Living Arrangements/Services Living arrangements for the past 2 months: Mobile Home Lives with:: Spouse Patient language and need for interpreter reviewed:: Yes Do you feel safe going back to the place where you live?: Yes      Need for Family Participation in Patient Care: Yes (Comment) (COVID) Care giver support system in place?: Yes (comment) (husband)   Criminal Activity/Legal Involvement Pertinent to Current Situation/Hospitalization: No - Comment as needed  Activities of Daily Living Home Assistive Devices/Equipment: None ADL Screening (condition at time of admission) Patient's cognitive ability adequate  to safely complete daily activities?: No Is the patient deaf or have difficulty hearing?: No Does the patient have difficulty seeing, even when wearing glasses/contacts?: No Does the patient have difficulty concentrating, remembering, or making decisions?: No Patient able to express need for assistance with ADLs?: Yes Does the patient have difficulty dressing or bathing?: No Independently performs ADLs?: No Communication: Independent Dressing (OT): Independent Grooming: Independent Feeding: Independent Bathing: Needs assistance Is this a change from baseline?: Pre-admission baseline Toileting: Needs assistance Is this a change from baseline?: Pre-admission baseline In/Out Bed: Independent Walks in Home: Independent Does the patient have difficulty walking or climbing stairs?: No Weakness of Legs: Both Weakness of Arms/Hands: None  Permission Sought/Granted Permission sought to share information with : Case Manager, Family Supports Permission granted to share information with : Yes, Verbal Permission Granted  Share Information with NAME: Christia Reading     Permission granted to share info w Relationship: Husband  Permission granted to share info w Contact Information: [phone]  Emotional Assessment       Orientation: : Oriented to Self, Oriented to Place, Oriented to  Time, Oriented to Situation Alcohol / Substance Use: Not Applicable Psych Involvement: No (comment)  Admission diagnosis:  AKI (acute kidney injury) ([place]) [N17.9] Acute hypoxemic respiratory failure due to COVID-19 ([place]) [U07.1, J96.01] Patient Active Problem List   Diagnosis Date Noted  .  AKI (acute kidney injury) ([place]) 04/28/2020  . Abscess of axilla, right 04/16/2019  . Iron deficiency anemia due to chronic blood loss   . Polyp of ascending colon   . Chronic gastric ulcer with hemorrhage   . Type 2 diabetes with decreased circulation ([place]) 11/07/2018  . Vitamin B12 deficiency 08/08/2018  . Encounter for  Medicare annual wellness exam 10/30/2017  . Patient is full code 10/30/2017  . Disorder of bursae of shoulder region 10/09/2017  . Coronary artery disease 10/09/2017  . Plantar fascial fibromatosis 10/09/2017  . Tendinitis of wrist 10/09/2017  . Schizoaffective disorder, bipolar type ([place]) 09/28/2017  . Chest pain 09/10/2017  . Pleuritic chest pain 05/27/2017  . Hypertriglyceridemia 12/21/2016  . Pansinusitis 11/15/2016  . 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 Breast cancer screening 09/28/2016  . Urine frequency 09/28/2016  . Preventative health care 09/28/2016  . Fatigue 05/01/2016  . Urinary tract infection 03/14/2016  . Adenomatous colon polyp 10/15/2015  . Bipolar affective disorder ([place]) 10/15/2015  . CN (constipation) 10/15/2015  . Drug-induced hepatic toxicity 10/15/2015  . Blood in feces 10/15/2015  . HLD (hyperlipidemia) 10/15/2015  . Hypoxemia 10/15/2015  . NASH (nonalcoholic steatohepatitis) 10/15/2015  . Morbid obesity ([place]) 10/15/2015  . Restless leg syndrome 10/15/2015  . Type 2 diabetes, uncontrolled, with neuropathy ([place]) 10/15/2015  . Elevated alkaline phosphatase level 09/22/2015  . Urinary bladder incontinence 09/22/2015  . Low back pain with left-sided sciatica 08/18/2015  . Encounter for monitoring tricyclic antidepressant therapy 05/13/2015  . Bipolar disorder ([place]) 05/11/2015  . Apnea, sleep 05/05/2015  . Heart valve disease 05/05/2015  . Chronic kidney disease, stage III (moderate) 03/15/2015  . Migraine 03/12/2015  . COPD (chronic obstructive pulmonary disease) ([place]) 01/20/2015  . 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 GERD (gastroesophageal reflux disease) 01/20/2015  . Hypothyroid   . Vitamin D deficiency   . Urinary incontinence   . Liver lesion   . Hypomagnesemia   . Obstructive sleep apnea 10/20/2013  . Vaginitis 12/19/2011  . PERIPHERAL NEUROPATHY 03/04/2009  . HEMORRHOIDS, EXTERNAL 09/03/2008  . ASTHMA,  PERSISTENT, MODERATE 07/04/2007  . INTERSTITIAL CYSTITIS 07/04/2007  . Allergic rhinitis 07/04/2007  . HYPERTENSION, BENIGN ESSENTIAL 06/19/2007   PCP:  Steele Sizer, MD Pharmacy:   [place], [place] - [address] [place] [address] Phone: [phone] Fax: [phone]     Social Determinants of Health (SDOH) Interventions    Readmission Risk Interventions Readmission Risk Prevention Plan 04/29/2020  Transportation Screening Complete  Medication Review ([place]) Complete  PCP or Specialist appointment within 3-5 days of discharge Complete  HRI or [place] Complete  SW Recovery Care/Counseling Consult Complete  [place] Not Applicable  Some recent data might be hidden

## 2020-04-29 NOTE — Care Management Important Message (Signed)
Important Message  Patient Details  Name: [name] MRN: [id number] Date of Birth: Jan 22, 1966   Medicare Important Message Given:  Yes  Initial Medicare IM given by Patient Access Associate on 04/29/2020 at 3:31pm.   Dannette Barbara 04/29/2020, 4:51 PM

## 2020-04-29 NOTE — Progress Notes (Signed)
[facility] Progress Note  Patient: [name]    SFK:[id number]  DOA: 04/28/2020     Date of Service: the patient was seen and examined on 04/29/2020  Brief hospital course: Past medical history of COPD on 2 LPM at home, CKD 3, HTN, OSA, bipolar, obesity. Presents with COVID-19 pneumonia. Currently plan is continue current treatment.  Assessment and Plan: 1. Acute COVID-19 Viral Pneumonia CXR: hazy bilateral peripheral opacities Tmax last 24 hours:  Temp (24hrs), Avg:98.7 F (37.1 C), Min:97.9 F (36.6 C), Max:99 F (37.2 C)  Oxygen requirements: On 3 LPM. Home 2 LPM. CRP: 4.5 Remdesivir: Started on 04/28/2020 Steroids: Started on 04/28/2020 Baricitinib/Actemra(off-label use): Not indicated for now. Antibiotics: None indicated Vitamin C and Zinc: Continue DVT Prophylaxis: enoxaparin (LOVENOX) injection 40 mg Start: 04/29/20 0008    Prone positioning: Patient encouraged to stay in prone position as much as possible.  The treatment plan and use of medications and known side effects were discussed with patient/family. It was clearly explained that Complete risks and long-term side effects are unknown, however in the best clinical judgment they seem to be of some clinical benefit rather than medical risks. Patient/family agree with the treatment plan and want to receive these treatments as indicated.   2. Acute kidney injury on chronic kidney disease stage IIIb Renal function at baseline 1.1-1.3. Currently 1.8. Receiving IV fluids. Likely prerenal etiology but also hemodynamic injury with intrinsic renal etiology can not be ruled out. Continue to monitor renal function in the hospital. Avoid nephrotoxic medications.  3. Type 2 diabetes mellitus: Uncontrolled with hyperglycemia with renal complication. Resuming U500 Continue sliding scale insulin. Monitor. Hemoglobin A1c 10.3.  4. Dizziness Currently resolved. Monitor. Combination of vertigo as well as  hypotension  5. Hyperlipidemia Continue home regimen. Holding fibrate  6. Anxiety and depression Bipolar disorder Currently stable. Continue BuSpar and citalopram. Eager to go home, out of the hospital. Continue Lamictal and Seroquel per home dose. Also continue trazodone.  7. Hypothyroidism Continue Synthroid  8. Essential hypertension Holding lisinopril. Holding Lopressor. Currently hypotensive. Monitor.  9. GERD Continue PPI.  10. Obesity Body mass index is 39.14 kg/m.   Diet: Cardiac diet carb modified DVT Prophylaxis:   enoxaparin (LOVENOX) injection 40 mg Start: 04/29/20 0008    Advance goals of care discussion: Full code  Family Communication: no family was present at bedside, at the time of interview.   Disposition:  Status is: Inpatient  Remains inpatient appropriate because:Inpatient level of care appropriate due to severity of illness   Dispo: The patient is from: Home              Anticipated d/c is to: Home              Anticipated d/c date is: > 3 days              Patient currently is not medically stable to d/c.  Subjective: Continues to have shortness of breath. No nausea no vomiting. No fever no chills no chest pain no abdominal. Dizziness resolved.  Physical Exam:  General: Appear in mild distress, no Rash; Oral Mucosa Clear, moist. no Abnormal Neck Mass Or lumps, Conjunctiva normal  Cardiovascular: S1 and S2 Present, no Murmur, Respiratory: increased respiratory effort, Bilateral Air entry present and bilateral Crackles, no wheezes Abdomen: Bowel Sound present, Soft and no tenderness Extremities: no Pedal edema, no calf tenderness Neurology: alert and oriented to time, place, and person affect appropriate. no new focal deficit Gait not checked due  to patient safety concerns  Vitals:   04/29/20 0527 04/29/20 0935 04/29/20 1345 04/29/20 1630  BP: (!) 91/51 (!) 102/55 102/60 (!) 116/57  Pulse: 64 76    Resp:  17 18 18   Temp:  98.7 F  (37.1 C) 98.7 F (37.1 C) 99 F (37.2 C)  TempSrc:   Oral Oral  SpO2:  96%    Weight:      Height:        Intake/Output Summary (Last 24 hours) at 04/29/2020 1819 Last data filed at 04/29/2020 0100 Gross per 24 hour  Intake 60 ml  Output --  Net 60 ml   Filed Weights   04/28/20 0929 04/29/20 0041  Weight: 86 kg 87.9 kg    Data Reviewed: I have personally reviewed and interpreted daily labs, tele strips, imagings as discussed above. I reviewed all nursing notes, pharmacy notes, vitals, pertinent old records I have discussed plan of care as described above with RN and patient/family.  CBC: Recent Labs  Lab 04/28/20 0931 04/29/20 0553  WBC 3.4*  3.3* 2.9*  NEUTROABS 2.0 1.5*  HGB 11.0*  11.0* 9.8*  HCT 35.3*  35.1* 31.8*  MCV 93.9  92.1 93.8  PLT 162  167 962*   Basic Metabolic Panel: Recent Labs  Lab 04/28/20 0931 04/29/20 0553  NA 135 140  K 4.6 5.0  CL 99 106  CO2 22 18*  GLUCOSE 247* 290*  BUN 35* 48*  CREATININE 1.82* 1.80*  CALCIUM 8.8* 7.7*  MG  --  2.6*  PHOS  --  6.6*    Studies: CT Angio Chest PE W and/or Wo Contrast  Result Date: 04/28/2020 CLINICAL DATA:  PE suspected, high probability with aches and shortness of breath and elevated D-dimer in the setting of COVID-19 infection EXAM: CT ANGIOGRAPHY CHEST WITH CONTRAST TECHNIQUE: Multidetector CT imaging of the chest was performed using the standard protocol during bolus administration of intravenous contrast. Multiplanar CT image reconstructions and MIPs were obtained to evaluate the vascular anatomy. CONTRAST:  40m OMNIPAQUE IOHEXOL 350 MG/ML SOLN COMPARISON:  May 27, 2017 FINDINGS: Cardiovascular: Aortic caliber is normal. Heart size is normal without pericardial effusion. Scattered aortic atherosclerosis. No pulmonary embolism. Mediastinum/Nodes: Thoracic inlet structures are normal. No axillary lymphadenopathy. Mildly enlarged mediastinal lymph nodes, generalized, also associated with  bilateral hilar nodal prominence. Subcarinal lymph node (image 42, series 4 8 mm. LEFT suprahilar lymph node 9 mm (image 31, series 4) Scattered, similarly enlarged RIGHT paratracheal lymph nodes and superior mediastinal lymph nodes. Lungs/Pleura: Bilateral ground-glass opacities throughout the chest with peripheral predominance. No effusion. All areas of the parenchyma are involved. Upper Abdomen: Hepatic steatosis. Post cholecystectomy. No acute upper abdominal process to the extent evaluated. Musculoskeletal: No acute musculoskeletal finding. No destructive bone process. Review of the MIP images confirms the above findings. IMPRESSION: 1. No pulmonary embolism.  Study is mildly limited by bolus timing. 2. Bilateral ground-glass opacities throughout the chest with peripheral predominance. Findings are consistent with the provided history of COVID-19 infection. 3. Mildly enlarged mediastinal and hilar lymph nodes, likely reactive. 4. Hepatic steatosis. 5. Aortic atherosclerosis. Aortic Atherosclerosis (ICD10-I70.0). Electronically Signed   By: GZetta BillsM.D.   On: 04/28/2020 18:42    Scheduled Meds: . atorvastatin  80 mg Oral QHS  . busPIRone  20 mg Oral BID  . citalopram  20 mg Oral Daily  . dexamethasone  6 mg Oral Q24H  . enoxaparin (LOVENOX) injection  40 mg Subcutaneous Q24H  . ezetimibe  10  mg Oral Daily  . gabapentin  300 mg Oral BID   And  . gabapentin  600 mg Oral QHS  . insulin aspart  0-20 Units Subcutaneous TID WC  . insulin aspart  0-5 Units Subcutaneous QHS  . insulin regular human CONCENTRATED  15 Units Subcutaneous TID WC  . Ipratropium-Albuterol  1 puff Inhalation Q6H  . lamoTRIgine  200 mg Oral BID  . [START ON 04/30/2020] levothyroxine  50 mcg Oral Q M,W,F   And  . levothyroxine  75 mcg Oral Q T,Th,S,Su  . pantoprazole  40 mg Oral Daily  . QUEtiapine  500 mg Oral QHS  . traZODone  100 mg Oral QHS   Continuous Infusions: . lactated ringers 75 mL/hr at 04/29/20 1331  .  remdesivir 100 mg in NS 100 mL 100 mg (04/29/20 1148)   PRN Meds: acetaminophen, albuterol, guaiFENesin-dextromethorphan, methocarbamol, ondansetron **OR** ondansetron (ZOFRAN) IV, polyethylene glycol  Time spent: 35 minutes  Author: Berle Mull, MD [facility] 04/29/2020 6:19 PM  To reach On-call, see care teams to locate the attending and reach out via [email or web address]. Between 7PM-7AM, please contact night-coverage If you still have difficulty reaching the attending provider, please page the [facility] (Director on Call) for [facility] on amion for assistance.

## 2020-04-30 LAB — CBC WITH DIFFERENTIAL/PLATELET
Abs Immature Granulocytes: 0.06 10*3/uL (ref 0.00–0.07)
Basophils Absolute: 0 10*3/uL (ref 0.0–0.1)
Basophils Relative: 0 %
Eosinophils Absolute: 0 10*3/uL (ref 0.0–0.5)
Eosinophils Relative: 0 %
HCT: 29.4 % — ABNORMAL LOW (ref 36.0–46.0)
Hemoglobin: 9.6 g/dL — ABNORMAL LOW (ref 12.0–15.0)
Immature Granulocytes: 2 %
Lymphocytes Relative: 24 %
Lymphs Abs: 0.7 10*3/uL (ref 0.7–4.0)
MCH: 29.2 pg (ref 26.0–34.0)
MCHC: 32.7 g/dL (ref 30.0–36.0)
MCV: 89.4 fL (ref 80.0–100.0)
Monocytes Absolute: 0.2 10*3/uL (ref 0.1–1.0)
Monocytes Relative: 6 %
Neutro Abs: 2.1 10*3/uL (ref 1.7–7.7)
Neutrophils Relative %: 68 %
Platelets: 151 10*3/uL (ref 150–400)
RBC: 3.29 MIL/uL — ABNORMAL LOW (ref 3.87–5.11)
RDW: 15.3 % (ref 11.5–15.5)
WBC: 3.1 10*3/uL — ABNORMAL LOW (ref 4.0–10.5)
nRBC: 0 % (ref 0.0–0.2)

## 2020-04-30 LAB — COMPREHENSIVE METABOLIC PANEL
ALT: 50 U/L — ABNORMAL HIGH (ref 0–44)
AST: 71 U/L — ABNORMAL HIGH (ref 15–41)
Albumin: 3 g/dL — ABNORMAL LOW (ref 3.5–5.0)
Alkaline Phosphatase: 37 U/L — ABNORMAL LOW (ref 38–126)
Anion gap: 12 (ref 5–15)
BUN: 38 mg/dL — ABNORMAL HIGH (ref 6–20)
CO2: 22 mmol/L (ref 22–32)
Calcium: 8.1 mg/dL — ABNORMAL LOW (ref 8.9–10.3)
Chloride: 105 mmol/L (ref 98–111)
Creatinine, Ser: 1.41 mg/dL — ABNORMAL HIGH (ref 0.44–1.00)
GFR calc Af Amer: 48 mL/min — ABNORMAL LOW (ref >60)
GFR calc non Af Amer: 42 mL/min — ABNORMAL LOW (ref >60)
Glucose, Bld: 242 mg/dL — ABNORMAL HIGH (ref 70–99)
Potassium: 4.9 mmol/L (ref 3.5–5.1)
Sodium: 139 mmol/L (ref 135–145)
Total Bilirubin: 1.5 mg/dL — ABNORMAL HIGH (ref 0.3–1.2)
Total Protein: 5.4 g/dL — ABNORMAL LOW (ref 6.5–8.1)

## 2020-04-30 LAB — FERRITIN: Ferritin: 663 ng/mL — ABNORMAL HIGH (ref 11–307)

## 2020-04-30 LAB — MAGNESIUM: Magnesium: 2.8 mg/dL — ABNORMAL HIGH (ref 1.7–2.4)

## 2020-04-30 LAB — GLUCOSE, CAPILLARY
Glucose-Capillary: 209 mg/dL — ABNORMAL HIGH (ref 70–99)
Glucose-Capillary: 290 mg/dL — ABNORMAL HIGH (ref 70–99)
Glucose-Capillary: 230 mg/dL — ABNORMAL HIGH (ref 70–99)
Glucose-Capillary: 238 mg/dL — ABNORMAL HIGH (ref 70–99)

## 2020-04-30 LAB — C-REACTIVE PROTEIN: CRP: 3.9 mg/dL — ABNORMAL HIGH (ref <1.0)

## 2020-04-30 LAB — FIBRIN DERIVATIVES D-DIMER (ARMC ONLY): Fibrin derivatives D-dimer (ARMC): 553.24 ng/mL (FEU) — ABNORMAL HIGH (ref 0.00–499.00)

## 2020-04-30 LAB — PHOSPHORUS: Phosphorus: 3.8 mg/dL (ref 2.5–4.6)

## 2020-04-30 MED ORDER — INSULIN REGULAR HUMAN (CONC) 500 UNIT/ML ~~LOC~~ SOPN
20.0000 [IU] | PEN_INJECTOR | Freq: Three times a day (TID) | SUBCUTANEOUS | Status: DC
  Administered 2020-04-30 – 2020-05-02 (×5): 20 [IU] via SUBCUTANEOUS
  Filled 2020-04-30: qty 3

## 2020-04-30 NOTE — Progress Notes (Signed)
[facility] Progress Note  Patient: [name]    ZFP:[id number]  DOA: 04/28/2020     Date of Service: the patient was seen and examined on 04/30/2020  Brief hospital course: Past medical history of COPD on 2 LPM at home, CKD 3, HTN, OSA, bipolar, obesity. Presents with COVID-19 pneumonia. Currently plan is continue current treatment.  Assessment and Plan: 1. Acute COVID-19 Viral Pneumonia CXR: hazy bilateral peripheral opacities Tmax last 24 hours:  Temp (24hrs), Avg:99.3 F (37.4 C), Min:98.1 F (36.7 C), Max:100.9 F (38.3 C)  Oxygen requirements: On 3 LPM. Home 2 LPM. CRP: 4.5 to 3.9 Remdesivir: Started on 04/28/2020 Steroids: Started on 04/28/2020 Baricitinib/Actemra(off-label use): Not indicated for now. Antibiotics: None indicated Vitamin C and Zinc: Continue DVT Prophylaxis: enoxaparin (LOVENOX) injection 40 mg Start: 04/29/20 0008    Prone positioning: Patient encouraged to stay in prone position as much as possible.  The treatment plan and use of medications and known side effects were discussed with patient/family. It was clearly explained that Complete risks and long-term side effects are unknown, however in the best clinical judgment they seem to be of some clinical benefit rather than medical risks. Patient/family agree with the treatment plan and want to receive these treatments as indicated.   2. Acute kidney injury on chronic kidney disease stage IIIb Renal function at baseline 1.1-1.3. Currently 1.8. Receiving IV fluids. Likely prerenal etiology but also hemodynamic injury with intrinsic renal etiology can not be ruled out. Continue to monitor renal function in the hospital. Avoid nephrotoxic medications.  3. Type 2 diabetes mellitus: Uncontrolled with hyperglycemia with renal complication. Resuming U500 Continue sliding scale insulin. Monitor. Hemoglobin A1c 10.3.  4. Dizziness Currently resolved. Monitor. Combination of vertigo as well as  hypotension  5. Hyperlipidemia Continue home regimen. Holding fibrate  6. Anxiety and depression Bipolar disorder Currently stable. Continue BuSpar and citalopram. Eager to go home, out of the hospital. Continue Lamictal and Seroquel per home dose. Also continue trazodone.  7. Hypothyroidism Continue Synthroid  8. Essential hypertension Holding lisinopril. Holding Lopressor. Currently hypotensive. Monitor.  9. GERD Continue PPI.  10. Obesity Body mass index is 39.14 kg/m.   Diet: Cardiac diet carb modified DVT Prophylaxis:   enoxaparin (LOVENOX) injection 40 mg Start: 04/29/20 0008    Advance goals of care discussion: Full code  Family Communication: no family was present at bedside, at the time of interview.   Disposition:  Status is: Inpatient  Remains inpatient appropriate because:Inpatient level of care appropriate due to severity of illness   Dispo: The patient is from: Home              Anticipated d/c is to: Home              Anticipated d/c date is: > 3 days              Patient currently is not medically stable to d/c.  Subjective: No nausea no vomiting.  No fever no chills.  No chest pain.  Had a fever last night.  Feels fatigue and tired.  Physical Exam:  General: Appear in mild distress, no Rash; Oral Mucosa Clear, moist. no Abnormal Neck Mass Or lumps, Conjunctiva normal  Cardiovascular: S1 and S2 Present, no Murmur, Respiratory: increased respiratory effort, Bilateral Air entry present and bilateral Crackles, no wheezes Abdomen: Bowel Sound present, Soft and no tenderness Extremities: no Pedal edema, no calf tenderness Neurology: alert and oriented to time, place, and person affect appropriate. no new focal  deficit Gait not checked due to patient safety concerns  Vitals:   04/30/20 0751 04/30/20 1148 04/30/20 1655 04/30/20 2044  BP: (!) 107/40 (!) 101/43 (!) 109/52 (!) 120/53  Pulse: 77 68 76   Resp: 18 18 18 20   Temp: 98.4 F (36.9 C)  98.5 F (36.9 C) 98.6 F (37 C) 100.1 F (37.8 C)  TempSrc:  Oral Oral Oral  SpO2: 97% 95% 95%   Weight:      Height:        Intake/Output Summary (Last 24 hours) at 04/30/2020 2102 Last data filed at 04/30/2020 0431 Gross per 24 hour  Intake 1135.84 ml  Output --  Net 1135.84 ml   Filed Weights   04/28/20 0929 04/29/20 0041  Weight: 86 kg 87.9 kg    Data Reviewed: I have personally reviewed and interpreted daily labs, tele strips, imagings as discussed above. I reviewed all nursing notes, pharmacy notes, vitals, pertinent old records I have discussed plan of care as described above with RN and patient/family.  CBC: Recent Labs  Lab 04/28/20 0931 04/29/20 0553 04/30/20 0418  WBC 3.4*  3.3* 2.9* 3.1*  NEUTROABS 2.0 1.5* 2.1  HGB 11.0*  11.0* 9.8* 9.6*  HCT 35.3*  35.1* 31.8* 29.4*  MCV 93.9  92.1 93.8 89.4  PLT 162  167 137* 017   Basic Metabolic Panel: Recent Labs  Lab 04/28/20 0931 04/29/20 0553 04/30/20 0418  NA 135 140 139  K 4.6 5.0 4.9  CL 99 106 105  CO2 22 18* 22  GLUCOSE 247* 290* 242*  BUN 35* 48* 38*  CREATININE 1.82* 1.80* 1.41*  CALCIUM 8.8* 7.7* 8.1*  MG  --  2.6* 2.8*  PHOS  --  6.6* 3.8    Studies: No results found.  Scheduled Meds: . atorvastatin  80 mg Oral QHS  . busPIRone  20 mg Oral BID  . citalopram  20 mg Oral Daily  . dexamethasone  6 mg Oral Q24H  . enoxaparin (LOVENOX) injection  40 mg Subcutaneous Q24H  . ezetimibe  10 mg Oral Daily  . gabapentin  300 mg Oral BID   And  . gabapentin  600 mg Oral QHS  . insulin aspart  0-20 Units Subcutaneous TID WC  . insulin aspart  0-5 Units Subcutaneous QHS  . insulin regular human CONCENTRATED  20 Units Subcutaneous TID WC  . Ipratropium-Albuterol  1 puff Inhalation Q6H  . lamoTRIgine  200 mg Oral BID  . levothyroxine  50 mcg Oral Q M,W,F   And  . levothyroxine  75 mcg Oral Q T,Th,S,Su  . pantoprazole  40 mg Oral Daily  . QUEtiapine  500 mg Oral QHS  . traZODone  100 mg  Oral QHS   Continuous Infusions: . lactated ringers 75 mL/hr at 04/30/20 0431  . remdesivir 100 mg in NS 100 mL 100 mg (04/30/20 1116)   PRN Meds: acetaminophen, albuterol, guaiFENesin-dextromethorphan, methocarbamol, ondansetron **OR** ondansetron (ZOFRAN) IV, polyethylene glycol  Time spent: 35 minutes  Author: Berle Mull, MD [facility] 04/30/2020 9:02 PM  To reach On-call, see care teams to locate the attending and reach out via [email or web address]. Between 7PM-7AM, please contact night-coverage If you still have difficulty reaching the attending provider, please page the [facility] (Director on Call) for [facility] on amion for assistance.

## 2020-04-30 NOTE — Progress Notes (Signed)
Inpatient Diabetes Program Recommendations  AACE/ADA: New Consensus Statement on Inpatient Glycemic Control (2015)  Target Ranges:  Prepandial:   less than 140 mg/dL      Peak postprandial:   less than 180 mg/dL (1-2 hours)      Critically ill patients:  140 - 180 mg/dL   Lab Results  Component Value Date   GLUCAP 290 (H) 04/30/2020   HGBA1C 10.3 (H) 03/31/2020    Review of Glycemic Control  Diabetes history: DM2 Outpatient Diabetes medications: Humulin R U 500 70 units tid + Jardiance 25 mg qd + Novolog 5-25 units tid meal coverage + Trulicity qweek Current orders for Inpatient glycemic control:Humulin R U 500 15 units tid +  Novolog resistant correction 0-20 units tid + hs 0-5 units + Decadron 6 mg q d  Inpatient Diabetes Program Recommendations:   -Consider increase in Humulin R U 500 insulin to 20 units tid  Secure chat sent to Dr. Berle Mull.  Thank you, Nani Gasser. Freddie Nghiem, RN, MSN, CDE  Diabetes Coordinator Inpatient Glycemic Control Team Team Pager [phone] (8am-5pm) 04/30/2020 2:23 PM

## 2020-05-01 LAB — COMPREHENSIVE METABOLIC PANEL
ALT: 44 U/L (ref 0–44)
AST: 57 U/L — ABNORMAL HIGH (ref 15–41)
Albumin: 3 g/dL — ABNORMAL LOW (ref 3.5–5.0)
Alkaline Phosphatase: 36 U/L — ABNORMAL LOW (ref 38–126)
Anion gap: 6 (ref 5–15)
BUN: 29 mg/dL — ABNORMAL HIGH (ref 6–20)
CO2: 30 mmol/L (ref 22–32)
Calcium: 8.5 mg/dL — ABNORMAL LOW (ref 8.9–10.3)
Chloride: 104 mmol/L (ref 98–111)
Creatinine, Ser: 1 mg/dL (ref 0.44–1.00)
GFR calc Af Amer: >60 mL/min (ref >60)
GFR calc non Af Amer: >60 mL/min (ref >60)
Glucose, Bld: 265 mg/dL — ABNORMAL HIGH (ref 70–99)
Potassium: 5 mmol/L (ref 3.5–5.1)
Sodium: 140 mmol/L (ref 135–145)
Total Bilirubin: 0.9 mg/dL (ref 0.3–1.2)
Total Protein: 5.6 g/dL — ABNORMAL LOW (ref 6.5–8.1)

## 2020-05-01 LAB — GLUCOSE, CAPILLARY
Glucose-Capillary: 227 mg/dL — ABNORMAL HIGH (ref 70–99)
Glucose-Capillary: 353 mg/dL — ABNORMAL HIGH (ref 70–99)
Glucose-Capillary: 295 mg/dL — ABNORMAL HIGH (ref 70–99)
Glucose-Capillary: 191 mg/dL — ABNORMAL HIGH (ref 70–99)

## 2020-05-01 LAB — CBC WITH DIFFERENTIAL/PLATELET
Abs Immature Granulocytes: 0.14 10*3/uL — ABNORMAL HIGH (ref 0.00–0.07)
Basophils Absolute: 0 10*3/uL (ref 0.0–0.1)
Basophils Relative: 0 %
Eosinophils Absolute: 0 10*3/uL (ref 0.0–0.5)
Eosinophils Relative: 0 %
HCT: 30.9 % — ABNORMAL LOW (ref 36.0–46.0)
Hemoglobin: 10.1 g/dL — ABNORMAL LOW (ref 12.0–15.0)
Immature Granulocytes: 4 %
Lymphocytes Relative: 24 %
Lymphs Abs: 0.8 10*3/uL (ref 0.7–4.0)
MCH: 29.2 pg (ref 26.0–34.0)
MCHC: 32.7 g/dL (ref 30.0–36.0)
MCV: 89.3 fL (ref 80.0–100.0)
Monocytes Absolute: 0.2 10*3/uL (ref 0.1–1.0)
Monocytes Relative: 6 %
Neutro Abs: 2.2 10*3/uL (ref 1.7–7.7)
Neutrophils Relative %: 66 %
Platelets: 165 10*3/uL (ref 150–400)
RBC: 3.46 MIL/uL — ABNORMAL LOW (ref 3.87–5.11)
RDW: 15.4 % (ref 11.5–15.5)
WBC: 3.4 10*3/uL — ABNORMAL LOW (ref 4.0–10.5)
nRBC: 0 % (ref 0.0–0.2)

## 2020-05-01 LAB — C-REACTIVE PROTEIN: CRP: 3.4 mg/dL — ABNORMAL HIGH (ref <1.0)

## 2020-05-01 LAB — MAGNESIUM: Magnesium: 2.4 mg/dL (ref 1.7–2.4)

## 2020-05-01 LAB — PHOSPHORUS: Phosphorus: 2.7 mg/dL (ref 2.5–4.6)

## 2020-05-01 LAB — FIBRIN DERIVATIVES D-DIMER (ARMC ONLY): Fibrin derivatives D-dimer (ARMC): 563.28 ng/mL (FEU) — ABNORMAL HIGH (ref 0.00–499.00)

## 2020-05-01 LAB — FERRITIN: Ferritin: 554 ng/mL — ABNORMAL HIGH (ref 11–307)

## 2020-05-01 MED ORDER — ENOXAPARIN SODIUM 60 MG/0.6ML ~~LOC~~ SOLN
44.0000 mg | SUBCUTANEOUS | Status: DC
  Administered 2020-05-02: 45 mg via SUBCUTANEOUS
  Filled 2020-05-01: qty 0.6

## 2020-05-01 MED ORDER — INSULIN STARTER KIT- PEN NEEDLES (ENGLISH)
1.0000 | Freq: Once | Status: AC
  Administered 2020-05-01: 1
  Filled 2020-05-01: qty 1

## 2020-05-01 NOTE — Progress Notes (Signed)
Physical Therapy Evaluation Patient Details Name: [name] MRN: [id number] DOB: 1966-04-11 Today's Date: 05/01/2020   History of Present Illness  [name] is a 54 y.o. female with history of COPD on 1 to 2 L home oxygen, CKD, hypertension, OSA, bipolar, [place], obesity, who presented with cough and weakness. Patient presented to the ED with 2-day history of feeling weak, dizzy, and having leg pain.  She states that the symptoms started approximately 2 days prior to arrival. She reports she has fallen 3 times recently she denies head strike. Patient unable to give a good history for dizziness, states that it is not all the time, but worse when she is walking.  Not worse when she rolls over in bed.  Leg pain is bilateral tingly, started 2 days prior.  She is normally on 2 L of home oxygen, has also had a cough that has been nonproductive, has also felt a little more short of breath.  She did not receive her Covid vaccine. In the ED initial vital signs notable for mildly low blood pressure 102/57 but patient satting 90% and above on home O2 of 2 L while at rest, though she did desaturate with ambulation.  Lab work-up notable for BMP showing AKI with creatinine increased from 1.3 on last check to 1.8, hyperglycemia with glucose 247, CBC showing leukopenia with WBC decrease from 8.6-3.3 and stable anemia with hemoglobin 11.  Covid test was obtained which was positive.  Lactic acid and procalcitonin were normal, D-dimer was mildly elevated at 653, UA with significant glucosuria but not suspicious for infection.  Due to history of falls patient underwent a CT head which showed no acute abnormality, as well as a CTPA which was negative for PE but did show bilateral groundglass opacities consistent with Covid pneumonia.  EKG was obtained which did not show any ischemic changes.  As patient initially thought to have pneumonia she was given 1 dose of azithromycin.  Once Covid returned positive and imaging  findings were obtained, she was started on Decadron and remdesivir in the emergency department as well as being given a 1 L normal saline bolus. Patient was admitted for further management of her AKI in the setting of Covid pneumonia.  Clinical Impression  Pt admitted with above diagnosis. Pt currently with functional limitations due to the deficits listed below (see PT Problem List). Pt is independent for bed mobility. She demonstrates safe hand placement with transfers and good stability in standing without UE support. She is able to ambulate in room without assistive device. She does have one stumble due to her leg getting caught on blanket but is able to correct by reaching out and holding onto bed. Overall she is stable during ambulation. SpO2 remains at or above 94% on 4L/min O2.  Positive romberg during balance assessment. Single leg balance is less than 1s on BLE. History of 12 falls in the last 12 months, 3 falls occurring recently. Recommend HH PT at discharge due to frequent falls as well as use of rolling walker at discharge. Pt will benefit from PT services to address deficits in strength, balance, and mobility in order to return to full function at home.      Follow Up Recommendations Home health PT;Other (comment) (12 falls in the last 12 months)    Equipment Recommendations  Rolling walker with 5" wheels;Other (comment) (Pt encouraged to use at discharge, 3 falls recently)    Recommendations for Other Services  Precautions / Restrictions Precautions Precautions: Fall Restrictions Weight Bearing Restrictions: No      Mobility  Bed Mobility Overal bed mobility: Independent             General bed mobility comments: Good speed/sequencing  Transfers Overall transfer level: Independent Equipment used: None             General transfer comment: Pt demonstrates safe hand placement and good stability in standing without UE  support  Ambulation/Gait Ambulation/Gait assistance: Min guard Gait Distance (Feet): 20 Feet Assistive device: None       General Gait Details: Pt is able to ambulate in room without assistive device. She does have one stumble due to her leg getting caught on blanket but is able to correct by reaching out and holding onto bed. Overall she is stable. SpO2 remains at or above 94% on 4L/min O2.   Stairs            Wheelchair Mobility    Modified Rankin (Stroke Patients Only)       Balance Overall balance assessment: Needs assistance Sitting-balance support: No upper extremity supported Sitting balance-Leahy Scale: Good     Standing balance support: No upper extremity supported Standing balance-Leahy Scale: Fair Standing balance comment: Positive romberg. Single leg balance is less than 1s on BLE. History of 12 falls in the last 12 months, 3 falls occurring recently                             Pertinent Vitals/Pain Pain Assessment: No/denies pain    Home Living Family/patient expects to be discharged to:: Private residence Living Arrangements: Spouse/significant other Available Help at Discharge: Family Type of Home: Mobile home Home Access: Stairs to enter Entrance Stairs-Rails: Can reach both Entrance Stairs-Number of Steps: 6 Home Layout: One level Home Equipment: None      Prior Function Level of Independence: Independent         Comments: Pt reports independence with ADLs/IADLs. Reports ambulation independently without assistive device. Pt with 12 reported falls in the last 12 months     Hand Dominance   Dominant Hand: Right    Extremity/Trunk Assessment   Upper Extremity Assessment Upper Extremity Assessment: Overall WFL for tasks assessed    Lower Extremity Assessment Lower Extremity Assessment: Overall WFL for tasks assessed       Communication   Communication: No difficulties  Cognition Arousal/Alertness:  Awake/alert Behavior During Therapy: WFL for tasks assessed/performed Overall Cognitive Status: Within Functional Limits for tasks assessed                                        General Comments      Exercises     Assessment/Plan    PT Assessment Patient needs continued PT services  PT Problem List Decreased balance;Decreased safety awareness       PT Treatment Interventions DME instruction;Gait training;Stair training;Functional mobility training;Therapeutic exercise;Therapeutic activities;Balance training;Neuromuscular re-education;Patient/family education    PT Goals (Current goals can be found in the Care Plan section)  Acute Rehab PT Goals Patient Stated Goal: Return to prior level of function at home PT Goal Formulation: With patient Time For Goal Achievement: 05/15/20 Potential to Achieve Goals: Good    Frequency Min 2X/week   Barriers to discharge        Co-evaluation  AM-PAC PT "6 Clicks" Mobility  Outcome Measure Help needed turning from your back to your side while in a flat bed without using bedrails?: None Help needed moving from lying on your back to sitting on the side of a flat bed without using bedrails?: None Help needed moving to and from a bed to a chair (including a wheelchair)?: None Help needed standing up from a chair using your arms (e.g., wheelchair or bedside chair)?: None Help needed to walk in hospital room?: A Little Help needed climbing 3-5 steps with a railing? : A Little 6 Click Score: 22    End of Session Equipment Utilized During Treatment: Gait belt Activity Tolerance: Patient tolerated treatment well Patient left: in bed;with call bell/phone within reach;with nursing/sitter in room Nurse Communication: Mobility status;Other (comment) (RN present to observe mobility) PT Visit Diagnosis: Unsteadiness on feet (R26.81);History of falling (Z91.81)    Time: 1530-1550 PT Time Calculation (min)  (ACUTE ONLY): 20 min   Charges:   PT Evaluation $PT Eval Low Complexity: 1 Low          Tonica Brasington D Echo [place] PT, DPT, GCS   [place] 05/01/2020, 4:08 PM

## 2020-05-01 NOTE — Progress Notes (Signed)
[facility] Progress Note  Patient: [name]    MLJ:[id number]  DOA: 04/28/2020     Date of Service: the patient was seen and examined on 05/01/2020  Brief hospital course: Past medical history of COPD on 2 LPM at home, CKD 3, HTN, OSA, bipolar, obesity. Presents with COVID-19 pneumonia. Currently plan is continue current treatment.  Assessment and Plan: 1. Acute COVID-19 Viral Pneumonia Acute on chronic hypoxic respiratory failure. Acute COPD exacerbation. CXR: hazy bilateral peripheral opacities Oxygen requirements: On 3 LPM. Home 2 LPM. CRP: 4.5 to 3.9 Remdesivir: Started on 04/28/2020 Steroids: Started on 04/28/2020 Baricitinib/Actemra(off-label use): Not indicated for now. Antibiotics: None indicated Vitamin C and Zinc: Continue DVT Prophylaxis: Lovenox  Prone positioning: Patient encouraged to stay in prone position as much as possible.  The treatment plan and use of medications and known side effects were discussed with patient/family. It was clearly explained that Complete risks and long-term side effects are unknown, however in the best clinical judgment they seem to be of some clinical benefit rather than medical risks. Patient/family agree with the treatment plan and want to receive these treatments as indicated.   2. Acute kidney injury on chronic kidney disease stage IIIb Renal function at baseline 1.1-1.3. Currently 1.8. Treated with IV fluids. Likely prerenal etiology but also hemodynamic injury with intrinsic renal etiology can not be ruled out. Avoid nephrotoxic medications.  3. Type 2 diabetes mellitus: Uncontrolled with hyperglycemia with renal complication. Resuming U500 Continue sliding scale insulin. Monitor. Hemoglobin A1c 10.3.  4. Dizziness Currently resolved. Monitor. Combination of vertigo as well as hypotension  5. Hyperlipidemia Continue home regimen. Holding fibrate  6. Anxiety and depression Bipolar disorder Currently  stable. Continue BuSpar and citalopram. Eager to go home, out of the hospital. Continue Lamictal and Seroquel per home dose. Also continue trazodone.  7. Hypothyroidism Continue Synthroid  8. Essential hypertension Holding lisinopril. Holding Lopressor. Currently hypotensive. Monitor.  9. GERD Continue PPI.  10. Obesity Body mass index is 39.14 kg/m.   Diet: Cardiac diet carb modified DVT Prophylaxis:  Lovenox.  Advance goals of care discussion: Full code  Family Communication: no family was present at bedside, at the time of interview.   Disposition:  Status is: Inpatient  Remains inpatient appropriate because:Inpatient level of care appropriate due to severity of illness   Dispo: The patient is from: Home              Anticipated d/c is to: Home              Anticipated d/c date is: > 3 days              Patient currently is not medically stable to d/c.  Subjective: No acute complaint.  No nausea vomiting but no fever no chills.  No chest pain or shortness of breath.  Physical Exam:  General: Appear in mild distress, no Rash; Oral Mucosa Clear, moist. no Abnormal Neck Mass Or lumps, Conjunctiva normal  Cardiovascular: S1 and S2 Present, no Murmur, Respiratory: increased respiratory effort, Bilateral Air entry present and bilateral Crackles, no wheezes Abdomen: Bowel Sound present, Soft and no tenderness Extremities: no Pedal edema, no calf tenderness Neurology: alert and oriented to time, place, and person affect appropriate. no new focal deficit Gait not checked due to patient safety concerns  Vitals:   05/01/20 0842 05/01/20 0843 05/01/20 1300 05/01/20 1654  BP:   (!) 112/53 (!) 132/57  Pulse: 83 82  70  Resp:    18  Temp:   98.3 F (36.8 C) 98.4 F (36.9 C)  TempSrc:   Oral Oral  SpO2: 95% 96%  95%  Weight:      Height:        Intake/Output Summary (Last 24 hours) at 05/01/2020 1824 Last data filed at 05/01/2020 7858 Gross per 24 hour  Intake  1221.51 ml  Output --  Net 1221.51 ml   Filed Weights   04/28/20 0929 04/29/20 0041  Weight: 86 kg 87.9 kg    Data Reviewed: I have personally reviewed and interpreted daily labs, tele strips, imagings as discussed above. I reviewed all nursing notes, pharmacy notes, vitals, pertinent old records I have discussed plan of care as described above with RN and patient/family.  CBC: Recent Labs  Lab 04/28/20 0931 04/29/20 0553 04/30/20 0418 05/01/20 0438  WBC 3.4*  3.3* 2.9* 3.1* 3.4*  NEUTROABS 2.0 1.5* 2.1 2.2  HGB 11.0*  11.0* 9.8* 9.6* 10.1*  HCT 35.3*  35.1* 31.8* 29.4* 30.9*  MCV 93.9  92.1 93.8 89.4 89.3  PLT 162  167 137* 151 850   Basic Metabolic Panel: Recent Labs  Lab 04/28/20 0931 04/29/20 0553 04/30/20 0418 05/01/20 0438  NA 135 140 139 140  K 4.6 5.0 4.9 5.0  CL 99 106 105 104  CO2 22 18* 22 30  GLUCOSE 247* 290* 242* 265*  BUN 35* 48* 38* 29*  CREATININE 1.82* 1.80* 1.41* 1.00  CALCIUM 8.8* 7.7* 8.1* 8.5*  MG  --  2.6* 2.8* 2.4  PHOS  --  6.6* 3.8 2.7    Studies: No results found.  Scheduled Meds: . atorvastatin  80 mg Oral QHS  . busPIRone  20 mg Oral BID  . citalopram  20 mg Oral Daily  . dexamethasone  6 mg Oral Q24H  . [START ON 05/02/2020] enoxaparin (LOVENOX) injection  45 mg Subcutaneous Q24H  . ezetimibe  10 mg Oral Daily  . gabapentin  300 mg Oral BID   And  . gabapentin  600 mg Oral QHS  . insulin aspart  0-20 Units Subcutaneous TID WC  . insulin aspart  0-5 Units Subcutaneous QHS  . insulin regular human CONCENTRATED  20 Units Subcutaneous TID WC  . Ipratropium-Albuterol  1 puff Inhalation Q6H  . lamoTRIgine  200 mg Oral BID  . levothyroxine  50 mcg Oral Q M,W,F   And  . levothyroxine  75 mcg Oral Q T,Th,S,Su  . pantoprazole  40 mg Oral Daily  . QUEtiapine  500 mg Oral QHS  . traZODone  100 mg Oral QHS   Continuous Infusions: . remdesivir 100 mg in NS 100 mL 100 mg (05/01/20 1207)   PRN Meds: acetaminophen, albuterol,  guaiFENesin-dextromethorphan, methocarbamol, ondansetron **OR** ondansetron (ZOFRAN) IV, polyethylene glycol  Time spent: 35 minutes  Author: Berle Mull, MD [facility] 05/01/2020 6:24 PM  To reach On-call, see care teams to locate the attending and reach out via [email or web address]. Between 7PM-7AM, please contact night-coverage If you still have difficulty reaching the attending provider, please page the [facility] (Director on Call) for [facility] on amion for assistance.

## 2020-05-02 LAB — CBC WITH DIFFERENTIAL/PLATELET
Abs Immature Granulocytes: 0.17 10*3/uL — ABNORMAL HIGH (ref 0.00–0.07)
Basophils Absolute: 0 10*3/uL (ref 0.0–0.1)
Basophils Relative: 0 %
Eosinophils Absolute: 0 10*3/uL (ref 0.0–0.5)
Eosinophils Relative: 0 %
HCT: 34.8 % — ABNORMAL LOW (ref 36.0–46.0)
Hemoglobin: 11.3 g/dL — ABNORMAL LOW (ref 12.0–15.0)
Immature Granulocytes: 4 %
Lymphocytes Relative: 28 %
Lymphs Abs: 1.1 10*3/uL (ref 0.7–4.0)
MCH: 28.8 pg (ref 26.0–34.0)
MCHC: 32.5 g/dL (ref 30.0–36.0)
MCV: 88.5 fL (ref 80.0–100.0)
Monocytes Absolute: 0.3 10*3/uL (ref 0.1–1.0)
Monocytes Relative: 7 %
Neutro Abs: 2.4 10*3/uL (ref 1.7–7.7)
Neutrophils Relative %: 61 %
Platelets: 194 10*3/uL (ref 150–400)
RBC: 3.93 MIL/uL (ref 3.87–5.11)
RDW: 15 % (ref 11.5–15.5)
WBC: 4 10*3/uL (ref 4.0–10.5)
nRBC: 0 % (ref 0.0–0.2)

## 2020-05-02 LAB — COMPREHENSIVE METABOLIC PANEL
ALT: 47 U/L — ABNORMAL HIGH (ref 0–44)
AST: 60 U/L — ABNORMAL HIGH (ref 15–41)
Albumin: 3.5 g/dL (ref 3.5–5.0)
Alkaline Phosphatase: 46 U/L (ref 38–126)
Anion gap: 11 (ref 5–15)
BUN: 27 mg/dL — ABNORMAL HIGH (ref 6–20)
CO2: 29 mmol/L (ref 22–32)
Calcium: 9.6 mg/dL (ref 8.9–10.3)
Chloride: 99 mmol/L (ref 98–111)
Creatinine, Ser: 1.01 mg/dL — ABNORMAL HIGH (ref 0.44–1.00)
GFR calc Af Amer: >60 mL/min (ref >60)
GFR calc non Af Amer: >60 mL/min (ref >60)
Glucose, Bld: 258 mg/dL — ABNORMAL HIGH (ref 70–99)
Potassium: 4.7 mmol/L (ref 3.5–5.1)
Sodium: 139 mmol/L (ref 135–145)
Total Bilirubin: 0.9 mg/dL (ref 0.3–1.2)
Total Protein: 6.6 g/dL (ref 6.5–8.1)

## 2020-05-02 LAB — FIBRIN DERIVATIVES D-DIMER (ARMC ONLY): Fibrin derivatives D-dimer (ARMC): 666.44 ng/mL (FEU) — ABNORMAL HIGH (ref 0.00–499.00)

## 2020-05-02 LAB — FERRITIN: Ferritin: 500 ng/mL — ABNORMAL HIGH (ref 11–307)

## 2020-05-02 LAB — GLUCOSE, CAPILLARY
Glucose-Capillary: 293 mg/dL — ABNORMAL HIGH (ref 70–99)
Glucose-Capillary: 350 mg/dL — ABNORMAL HIGH (ref 70–99)

## 2020-05-02 LAB — PHOSPHORUS: Phosphorus: 2.8 mg/dL (ref 2.5–4.6)

## 2020-05-02 LAB — C-REACTIVE PROTEIN: CRP: 2.2 mg/dL — ABNORMAL HIGH (ref <1.0)

## 2020-05-02 LAB — MAGNESIUM: Magnesium: 1.9 mg/dL (ref 1.7–2.4)

## 2020-05-02 MED ORDER — VITAMIN C 250 MG PO TABS: 250.0000 mg | ORAL_TABLET | Freq: Every day | ORAL | 0 refills | Status: DC

## 2020-05-02 MED ORDER — DM-GUAIFENESIN ER 30-600 MG PO TB12: 1.0000 | ORAL_TABLET | Freq: Two times a day (BID) | ORAL | 0 refills | Status: DC

## 2020-05-02 MED ORDER — DEXAMETHASONE 6 MG PO TABS: 6.0000 mg | ORAL_TABLET | ORAL | 0 refills | Status: AC

## 2020-05-02 MED ORDER — ZINC 220 (50 ZN) MG PO CAPS: 1.0000 | ORAL_CAPSULE | Freq: Every day | ORAL | 0 refills | Status: DC

## 2020-05-02 NOTE — TOC Transition Note (Signed)
Transition of Care [facility]) - CM/SW Discharge Note   Patient Details  Name: [name] MRN: [id number] Date of Birth: 10-04-1965  Transition of Care [facility]) CM/SW Contact:  Shelbie Hutching, RN Phone Number: 05/02/2020, 1:54 PM   Clinical Narrative:    Patient qualifies for continuous oxygen.  [facility] will be delivering oxygen to the patient's room before discharge.  Orders faxed to [phone].   Final next level of care: [place] Barriers to Discharge: Barriers Resolved   Patient Goals and CMS Choice Patient states their goals for this hospitalization and ongoing recovery are:: Agrees to home health services through [facility].gov Compare Post Acute Care list provided to:: Patient Choice offered to / list presented to : Patient  Discharge Placement                       Discharge Plan and Services   Discharge Planning Services: CM Consult            DME Arranged: Gilford Rile rolling DME Agency: [employer] Date DME Agency Contacted: 05/02/20 Time DME Agency Contacted: [place]: RN, PT, OT HH Agency: [facility] (formerly [employer]) Date [place]: 05/02/20 Time [place]: [place] Representative spoke with at [place]: [place] ([place]) Interventions     Readmission Risk Interventions Readmission Risk Prevention Plan 04/29/2020  Transportation Screening Complete  Medication Review Press photographer) Complete  PCP or Specialist appointment within 3-5 days of discharge Complete  HRI or [place] Complete  SW Recovery Care/Counseling Consult Complete  [place] Not Applicable  Some recent data might be hidden

## 2020-05-02 NOTE — Discharge Summary (Addendum)
[facility] Discharge Summary   Patient: [name] CVE:[id number]  PCP: Steele Sizer, MD  Date of admission: 04/28/2020   Date of discharge:  05/02/2020     Discharge Diagnoses:  Principal diagnosis Acute COVID-19 Viral Pneumonia Acute on chronic hypoxic respiratory failure.  Active Problems:   HYPERTENSION, BENIGN ESSENTIAL   INTERSTITIAL CYSTITIS   Obstructive sleep apnea   Hypothyroid   COPD (chronic obstructive pulmonary disease) (HCC)   Chronic kidney disease, stage III (moderate)   Heart valve disease   Bipolar disorder (HCC)   NASH (nonalcoholic steatohepatitis)   Morbid obesity (HCC)   Type 2 diabetes, uncontrolled, with neuropathy ([place])   AKI (acute kidney injury) ([place])   Admitted From: home Disposition:  Home   Recommendations for Outpatient Follow-up:  1. PCP: please follow up with pcp 2. Follow up LABS/TEST:  none   Follow-up Information    Steele Sizer, MD. Schedule an appointment as soon as possible for a visit in 1 week(s).   Specialty: Family Medicine Contact information: [address] [place] [address] [phone]              Diet recommendation: Cardiac diet  Activity: The patient is advised to gradually reintroduce usual activities, as tolerated  Discharge Condition: stable  Code Status: Full code   History of present illness: As per the H and P dictated on admission, "[name] is a 54 y.o. female with history of COPD on 1 to 2 L home oxygen, CKD, hypertension, OSA, bipolar, [place], obesity, who presents with cough and weakness.  Patient presents to the ED with 2-day history of feeling weak, dizzy, and having leg pain.  She states that the symptoms started approximately 2 days ago.  She reports she has fallen 3 times she denies head strike.  Patient unable to give a good history for dizziness, states that it is not all the time, but worse when she is walking.  Not worse when she rolls over in bed.  Leg  pain is bilateral tingly, started 2 days prior.  She is normally on 2 L of home oxygen, has also had a cough that has been nonproductive, has also felt a little more short of breath.  She did not receive her Covid vaccine.  In the ED initial vital signs notable for mildly low blood pressure 102/57 but patient satting 90% and above on home O2 of 2 L while at rest, though she did desaturate with ambulation.  Lab work-up notable for BMP showing AKI with creatinine increased from 1.3 on last check to 1.8, hyperglycemia with glucose 247, CBC showing leukopenia with WBC decrease from 8.6-3.3 and stable anemia with hemoglobin 11.  Covid test was obtained which was positive.  Lactic acid and procalcitonin were normal, D-dimer was mildly elevated at 653, UA with significant glucosuria but not suspicious for infection.  Due to history of falls patient underwent a CT head which showed no acute abnormality, as well as a CTPA which was negative for PE but did show bilateral groundglass opacities consistent with Covid pneumonia.  EKG was obtained which did not show any ischemic changes.  As patient initially thought to have pneumonia she was given 1 dose of azithromycin.  Once Covid returned positive and imaging findings were obtained, she was started on Decadron and remdesivir in the emergency department as well as being given a 1 L normal saline bolus.  Patient was admitted for further management of her AKI in the setting of  Covid pneumonia."  Hospital Course:  Summary of her active problems in the hospital is as following.   1. Acute COVID-19 Viral Pneumonia Acute on chronic hypoxic respiratory failure. Acute COPD exacerbation. CXR: hazy bilateral peripheral opacities Oxygen requirements: On 2 LPM. Home 2 LPM at night. CRP: 4.5 to 2.2 Remdesivir: Started on 04/28/2020 Steroids: Started on 04/28/2020 Baricitinib/Actemra(off-label use): Not indicated for now. Antibiotics: None indicated Vitamin C and Zinc:  Continue DVT Prophylaxis: Lovenox  Prone positioning: Patient encouraged to stay in prone position as much as possible.  The treatment plan and use of medications and known side effects were discussed with patient/family. It was clearly explained that Complete risks and long-term side effects are unknown, however in the best clinical judgment they seem to be of some clinical benefit rather than medical risks. Patient/family agree with the treatment plan and want to receive these treatments as indicated.   2. Acute kidney injury on chronic kidney disease stage IIIb Renal function at baseline 1.1-1.3. Currently baseline Treated with IV fluids. Likely prerenal etiology but also hemodynamic injury with intrinsic renal etiology can not be ruled out. Avoid nephrotoxic medications.  3. Type 2 diabetes mellitus: Uncontrolled with hyperglycemia with renal complication. Resuming U500 Continue sliding scale insulin. Monitor. Hemoglobin A1c 10.3.  4. Dizziness Currently resolved. Monitor. Combination of vertigo as well as hypotension  5. Hyperlipidemia Continue home regimen. Holding fibrate  6. Anxiety and depression Bipolar disorder Currently stable. Continue BuSpar and citalopram. Eager to go home, out of the hospital. Continue Lamictal and Seroquel per home dose. Also continue trazodone.  7. Hypothyroidism Continue Synthroid  8. Essential hypertension Resume lisinopril. Holding Lopressor. Currently normal. Monitor.  9. GERD Continue PPI.  10. Obesity  Body mass index is 39.14 kg/m.   Patient was seen by physical therapy, who recommended Home health, which was arranged. On the day of the discharge the patient's vitals were stable, and no other acute medical condition were reported by patient. the patient was felt safe to be discharge at Home with Home health.  Consultants: none Procedures: none  Discharge Exam: General: Appear in mild distress, no Rash; Oral Mucosa  Clear, moist. Cardiovascular: S1 and S2 Present, no Murmur, Respiratory: normal respiratory effort, Bilateral Air entry present and bilateral  Crackles, no wheezes Abdomen: Bowel Sound present, Soft and no tenderness, no hernia Extremities: no Pedal edema, no calf tenderness Neurology: alert and oriented to time, place, and person affect appropriate.  Filed Weights   04/28/20 0929 04/29/20 0041  Weight: 86 kg 87.9 kg   Vitals:   05/02/20 0510 05/02/20 0759  BP: 121/68 114/72  Pulse: 73 81  Resp: 18 16  Temp: 97.9 F (36.6 C) 98.4 F (36.9 C)  SpO2: 96% 96%    DISCHARGE MEDICATION: Allergies as of 05/02/2020      Reactions   Mirtazapine Anaphylaxis   REACTION: closes throat   Morphine And Related    anaphylaxis   Sulfamethoxazole-trimethoprim Rash   REACTION: Whelps all over   Amoxicillin Er Nausea And Vomiting   Has patient had a PCN reaction causing immediate rash, facial/tongue/throat swelling, SOB or lightheadedness with hypotension: No Has patient had a PCN reaction causing severe rash involving mucus membranes or skin necrosis: No Has patient had a PCN reaction that required hospitalization: No Has patient had a PCN reaction occurring within the last 10 years: Yes If all of the above answers are "NO", then may proceed with Cephalosporin use.   Codeine Itching   REACTION: itch, rash  Hydrocodone-acetaminophen Rash   REACTION: rash   Keflex [cephalexin] Rash   Prasterone Nausea And Vomiting      Medication List    STOP taking these medications   metoprolol tartrate 25 MG tablet Commonly known as: LOPRESSOR     TAKE these medications   albuterol 108 (90 Base) MCG/ACT inhaler Commonly known as: VENTOLIN HFA Inhale 2 puffs into the lungs every 6 (six) hours as needed for wheezing or shortness of breath.   atorvastatin 80 MG tablet Commonly known as: LIPITOR Take 80 mg by mouth at bedtime.   busPIRone 10 MG tablet Commonly known as: BUSPAR Take 20 mg by  mouth 2 (two) times daily.   citalopram 20 MG tablet Commonly known as: CELEXA Take 20 mg by mouth daily.   dexamethasone 6 MG tablet Commonly known as: DECADRON Take 1 tablet (6 mg total) by mouth daily for 5 days.   dextromethorphan-guaiFENesin 30-600 MG 12hr tablet Commonly known as: MUCINEX DM Take 1 tablet by mouth 2 (two) times daily.   empagliflozin 25 MG Tabs tablet Commonly known as: JARDIANCE Take 25 mg by mouth daily.   ezetimibe 10 MG tablet Commonly known as: ZETIA Take 10 mg by mouth daily.   fenofibrate micronized 134 MG capsule Commonly known as: LOFIBRA Take 134 mg by mouth daily.   FISH OIL PO Take by mouth daily.   fluticasone 50 MCG/ACT nasal spray Commonly known as: FLONASE Place 2 sprays into both nostrils daily.   gabapentin 300 MG capsule Commonly known as: Neurontin Take 1-2 capsules (300-600 mg total) by mouth 3 (three) times daily. Take 1 capsule each morning, 1 capsule each afternoon, and 2 capsules at bedtime.   HUMULIN R 500 UNIT/ML injection Generic drug: insulin regular human CONCENTRATED Inject 70 Units into the skin 3 (three) times daily with meals.   hydrOXYzine 50 MG capsule Commonly known as: VISTARIL Take 50 mg by mouth 3 (three) times daily as needed for Itching   lamoTRIgine 200 MG tablet Commonly known as: LAMICTAL Take 200 mg by mouth 2 (two) times daily.   levothyroxine 50 MCG tablet Commonly known as: SYNTHROID TAKE ONE TABLET DAILY 3 DAYS A WEEK, THEN TAKE ONE AND ONE-HALF TABLETS FOUR DAYS A WEEK   lisinopril 2.5 MG tablet Commonly known as: ZESTRIL TAKE 1 TABLET BY MOUTH EVERY DAY   NovoLOG FlexPen 100 UNIT/ML FlexPen Generic drug: insulin aspart Inject 3 Units subcutaneously. Sliding scale.   ondansetron 4 MG tablet Commonly known as: ZOFRAN TAKE 1 TABLET BY MOUTH EVERY 8 HOURS AS NEEDED FOR NAUSEA OR VOMITING What changed: See the new instructions.   pantoprazole 40 MG tablet Commonly known as:  PROTONIX Take 1 tablet (40 mg total) by mouth daily.   polyethylene glycol 17 g packet Commonly known as: MiraLax Take 17 g by mouth daily. What changed:   when to take this  reasons to take this   QUEtiapine 200 MG tablet Commonly known as: SEROQUEL Take 200 mg by mouth at bedtime.   traZODone 100 MG tablet Commonly known as: DESYREL Take 100 mg by mouth at bedtime.            Durable Medical Equipment  (From admission, onward)         Start     Ordered   05/02/20 1022  For home use only DME Walker rolling  Once       Question Answer Comment  Walker: With 5 Inch Wheels   Patient needs a walker to  treat with the following condition COPD (chronic obstructive pulmonary disease) ([place])      05/02/20 1023   05/02/20 1022  For home use only DME oxygen  Once       Question Answer Comment  Length of Need Lifetime   Mode or (Route) Nasal cannula   Liters per Minute 2   Frequency Continuous (stationary and portable oxygen unit needed)   Oxygen delivery system Gas      05/02/20 1023         Allergies  Allergen Reactions  . Mirtazapine Anaphylaxis    REACTION: closes throat  . Morphine And Related     anaphylaxis  . Sulfamethoxazole-Trimethoprim Rash    REACTION: Whelps all over  . Amoxicillin Er Nausea And Vomiting    Has patient had a PCN reaction causing immediate rash, facial/tongue/throat swelling, SOB or lightheadedness with hypotension: No Has patient had a PCN reaction causing severe rash involving mucus membranes or skin necrosis: No Has patient had a PCN reaction that required hospitalization: No Has patient had a PCN reaction occurring within the last 10 years: Yes If all of the above answers are "NO", then may proceed with Cephalosporin use.  . Codeine Itching    REACTION: itch, rash  . Hydrocodone-Acetaminophen Rash    REACTION: rash  . Keflex [Cephalexin] Rash  . Prasterone Nausea And Vomiting   Discharge Instructions    Diet - low sodium  heart healthy   Complete by: As directed    Increase activity slowly   Complete by: As directed       The results of significant diagnostics from this hospitalization (including imaging, microbiology, ancillary and laboratory) are listed below for reference.    Significant Diagnostic Studies: DG Chest 2 View  Result Date: 04/28/2020 CLINICAL DATA:  Weakness, dizziness EXAM: CHEST - 2 VIEW COMPARISON:  11/02/2019 FINDINGS: Low lung volumes. Unchanged mild elevation of the right hemidiaphragm. Focal increased basilar density on the lateral view. No pleural effusion. No pneumothorax. Mild cardiomegaly. No acute osseous abnormality. Cholecystectomy clips. IMPRESSION: There is focal increased basilar density on the lateral view likely reflecting atelectasis/consolidation. Not definitely seen on the multiple view. Electronically Signed   By: Macy Mis M.D.   On: 04/28/2020 14:32   DG Lumbar Spine 2-3 Views  Result Date: 04/28/2020 CLINICAL DATA:  Pain following fall EXAM: LUMBAR SPINE - 2-3 VIEW COMPARISON:  Jan 16, 2017. FINDINGS: Frontal, lateral, and spot lumbosacral lateral images were obtained. There are 5 non-rib-bearing lumbar type vertebral bodies. There is no fracture or spondylolisthesis. The disc spaces appear normal. There is facet osteoarthritic change at L5-S1 bilaterally. There is a stimulator on the right with the lead tip anterior to the lower sacrum. Surgical clips in right upper quadrant. IMPRESSION: Osteoarthritic change in the facets at L5-S1 bilaterally. No appreciable disc space narrowing. No fracture or spondylolisthesis. Stable stimulator lead positioning. Electronically Signed   By: Lowella Grip III M.D.   On: 04/28/2020 14:38   CT Head Wo Contrast  Result Date: 04/28/2020 CLINICAL DATA:  Dizziness, reported weakness over the past few days multiple falls. EXAM: CT HEAD WITHOUT CONTRAST TECHNIQUE: Contiguous axial images were obtained from the base of the skull  through the vertex without intravenous contrast. COMPARISON:  November 02, 2019 FINDINGS: Brain: No evidence of acute infarction, hemorrhage, hydrocephalus, extra-axial collection or mass lesion/mass effect. Signs of atrophy and chronic microvascular ischemic change as before. Vascular: No hyperdense vessel or unexpected calcification. Skull: Normal. Negative for fracture or focal  lesion. Sinuses/Orbits: Mild sphenoid sinus disease on the LEFT and scattered ethmoid opacification. Sinuses are incompletely imaged. Orbits, incompletely imaged without acute finding within visualized portions. Other: None. IMPRESSION: 1. No acute intracranial abnormality. 2. Signs of atrophy and chronic microvascular ischemic change as before. 3. Mild sphenoid sinus disease and scattered ethmoid opacification. Sinuses are incompletely imaged. Electronically Signed   By: Zetta Bills M.D.   On: 04/28/2020 14:06   CT Angio Chest PE W and/or Wo Contrast  Result Date: 04/28/2020 CLINICAL DATA:  PE suspected, high probability with aches and shortness of breath and elevated D-dimer in the setting of COVID-19 infection EXAM: CT ANGIOGRAPHY CHEST WITH CONTRAST TECHNIQUE: Multidetector CT imaging of the chest was performed using the standard protocol during bolus administration of intravenous contrast. Multiplanar CT image reconstructions and MIPs were obtained to evaluate the vascular anatomy. CONTRAST:  51m OMNIPAQUE IOHEXOL 350 MG/ML SOLN COMPARISON:  May 27, 2017 FINDINGS: Cardiovascular: Aortic caliber is normal. Heart size is normal without pericardial effusion. Scattered aortic atherosclerosis. No pulmonary embolism. Mediastinum/Nodes: Thoracic inlet structures are normal. No axillary lymphadenopathy. Mildly enlarged mediastinal lymph nodes, generalized, also associated with bilateral hilar nodal prominence. Subcarinal lymph node (image 42, series 4 8 mm. LEFT suprahilar lymph node 9 mm (image 31, series 4) Scattered,  similarly enlarged RIGHT paratracheal lymph nodes and superior mediastinal lymph nodes. Lungs/Pleura: Bilateral ground-glass opacities throughout the chest with peripheral predominance. No effusion. All areas of the parenchyma are involved. Upper Abdomen: Hepatic steatosis. Post cholecystectomy. No acute upper abdominal process to the extent evaluated. Musculoskeletal: No acute musculoskeletal finding. No destructive bone process. Review of the MIP images confirms the above findings. IMPRESSION: 1. No pulmonary embolism.  Study is mildly limited by bolus timing. 2. Bilateral ground-glass opacities throughout the chest with peripheral predominance. Findings are consistent with the provided history of COVID-19 infection. 3. Mildly enlarged mediastinal and hilar lymph nodes, likely reactive. 4. Hepatic steatosis. 5. Aortic atherosclerosis. Aortic Atherosclerosis (ICD10-I70.0). Electronically Signed   By: GZetta BillsM.D.   On: 04/28/2020 18:42    Microbiology: Recent Results (from the past 240 hour(s))  SARS Coronavirus 2 by RT PCR (hospital order, [facility]) Nasopharyngeal Nasopharyngeal Swab     Status: Abnormal   Collection Time: 04/28/20  2:44 PM   Specimen: Nasopharyngeal Swab  Result Value Ref Range Status   SARS Coronavirus 2 POSITIVE (A) NEGATIVE Final    Comment: RESULT CALLED TO, READ BACK BY AND VERIFIED WITH: JADEKA MANGRUM RN AT [id number] 04/28/20 SN[facility]  (NOTE) SARS-CoV-2 target nucleic acids are DETECTED  SARS-CoV-2 RNA is generally detectable in upper respiratory specimens  during the acute phase of infection.  Positive results are indicative  of the presence of the identified virus, but do not rule out bacterial infection or co-infection with other pathogens not detected by the test.  Clinical correlation with patient history and  other diagnostic information is necessary to determine patient infection status.  The expected result is negative.  Fact Sheet  for Patients:   hStrictlyIdeas.no  Fact Sheet for Healthcare Providers:   hBankingDealers.co.za   This test is not yet approved or cleared by the [place] and  has been authorized for detection and/or diagnosis of SARS-CoV-2 by FDA under an Emergency Use Authorization (EUA).  This EUA will remain in effect (meaning  this test can be used) for the duration of  the COVID-19 declaration under Section 564(b)(1) of the Act, 21 U.S.C. section 360-bbb-3(b)(1), unless  the authorization is terminated or revoked sooner.  Performed at [facility], [address] [place], [place] [address]      Labs: CBC: Recent Labs  Lab 04/28/20 [phone] 04/29/20 0553 04/30/20 0418 05/01/20 0438 05/02/20 0517  WBC 3.4*  3.3* 2.9* 3.1* 3.4* 4.0  NEUTROABS 2.0 1.5* 2.1 2.2 2.4  HGB 11.0*  11.0* 9.8* 9.6* 10.1* 11.3*  HCT 35.3*  35.1* 31.8* 29.4* 30.9* 34.8*  MCV 93.9  92.1 93.8 89.4 89.3 88.5  PLT 162  167 137* 151 165 235   Basic Metabolic Panel: Recent Labs  Lab 04/28/20 0931 04/29/20 0553 04/30/20 0418 05/01/20 0438 05/02/20 0517  NA 135 140 139 140 139  K 4.6 5.0 4.9 5.0 4.7  CL 99 106 105 104 99  CO2 22 18* 22 30 29   GLUCOSE 247* 290* 242* 265* 258*  BUN 35* 48* 38* 29* 27*  CREATININE 1.82* 1.80* 1.41* 1.00 1.01*  CALCIUM 8.8* 7.7* 8.1* 8.5* 9.6  MG  --  2.6* 2.8* 2.4 1.9  PHOS  --  6.6* 3.8 2.7 2.8   Liver Function Tests: Recent Labs  Lab 04/29/20 0553 04/30/20 0418 05/01/20 0438 05/02/20 0517  AST 98* 71* 57* 60*  ALT 58* 50* 44 47*  ALKPHOS 41 37* 36* 46  BILITOT 2.2* 1.5* 0.9 0.9  PROT 5.8* 5.4* 5.6* 6.6  ALBUMIN 3.2* 3.0* 3.0* 3.5   No results for input(s): LIPASE, AMYLASE in the last 168 hours. No results for input(s): AMMONIA in the last 168 hours. Cardiac Enzymes: No results for input(s): CKTOTAL, CKMB, CKMBINDEX, TROPONINI in the last 168 hours. BNP (last 3 results) No results for input(s):  BNP in the last 8760 hours. CBG: Recent Labs  Lab 05/01/20 1416 05/01/20 1652 05/01/20 2130 05/02/20 0758 05/02/20 1224  GLUCAP 353* 295* 191* 293* 350*    Time spent: 35 minutes  Signed:  Berle Mull  [facility]  05/02/2020 1:26 PM

## 2020-05-02 NOTE — Progress Notes (Signed)
SATURATION QUALIFICATIONS: (This note is used to comply with regulatory documentation for home oxygen)  Patient Saturations on Room Air at Rest = 87%  Patient Saturations on Room Air while Ambulating = 87%  Patient Saturations on 2 Liters of oxygen while Ambulating = 95%  Please briefly explain why patient needs home oxygen:  Patient should have 2 Liters of oxygen while at rest and ambulating, Patient states she really needs it after she is coughing, because she feels SOB

## 2020-05-02 NOTE — Progress Notes (Deleted)
SATURATION QUALIFICATIONS: (This note is used to comply with regulatory documentation for home oxygen)  Patient Saturations on Room Air at Rest = 87%  Patient Saturations on Room Air while Ambulating = 87%  Patient Saturations on 4 Liters of oxygen while Ambulating = 93-95%  Please briefly explain why patient needs home oxygen:  Patient states that she mainly needs oxygen after coughing, because she feels SOB. Patient did well on 4 liters of Oxygen while at rest and walking

## 2020-05-02 NOTE — TOC Transition Note (Signed)
Transition of Care [facility]) - CM/SW Discharge Note   Patient Details  Name: [name] MRN: [id number] Date of Birth: 03/30/1966  Transition of Care [facility] CM/SW Contact:  Shelbie Hutching, RN Phone Number: 05/02/2020, 11:21 AM   Clinical Narrative:    Patient is medically cleared for discharge home with her husband.  Patient agrees to home health services through [facility] for RN, PT, OT.  Drue Novel with [facility] accepted referral.  PT recommended a walker, walker provided by [employer] and delivered by this RNCM to the patient's room.  Patient has oxygen at home but only at night.  Bedside RN will complete walking Sats to determine if patient qualifies for continuous O2.  Oxygen is provided by [employer].  Patient's daughter will be picking her up today at discharge.    Final next level of care: [place] Barriers to Discharge: Barriers Resolved   Patient Goals and CMS Choice Patient states their goals for this hospitalization and ongoing recovery are:: Agrees to home health services through [facility].gov Compare Post Acute Care list provided to:: Patient Choice offered to / list presented to : Patient  Discharge Placement                       Discharge Plan and Services   Discharge Planning Services: CM Consult            DME Arranged: Gilford Rile rolling DME Agency: [employer] Date DME Agency Contacted: 05/02/20 Time DME Agency Contacted: [place]: RN, PT, OT HH Agency: [facility] (formerly [employer]) Date [place]: 05/02/20 Time [place]: [place] Representative spoke with at [place]: [place] ([place]) Interventions     Readmission Risk Interventions Readmission Risk Prevention Plan 04/29/2020  Transportation Screening Complete  Medication Review Press photographer) Complete  PCP or Specialist appointment within 3-5 days of discharge Complete  HRI or [place] Complete  SW Recovery Care/Counseling Consult Complete  [place] Not Applicable  Some recent data might be hidden

## 2020-05-03 ENCOUNTER — Telehealth: Payer: Self-pay

## 2020-05-03 NOTE — Telephone Encounter (Signed)
Transition Care Management Follow-up Telephone Call  Date of discharge and from where: 05/02/20 [facility]  How have you been since you were released from the hospital? Pt states she feels off balance, shaky and dizzy upon standing. Pt fell this morning, denied injury. Pt is also feeling very tired and weak. Pt confirmed [facility] home health contacted her for nursing.  Any questions or concerns? No  Items Reviewed:  Did the pt receive and understand the discharge instructions provided? Yes   Medications obtained and verified? Yes   Any new allergies since your discharge? No   Dietary orders reviewed? Yes  Do you have support at home? Yes   Functional Questionnaire: (I = Independent and D = Dependent) ADLs: I  Bathing/Dressing- I  Meal Prep- I  Eating- I  Maintaining continence- I  Transferring/Ambulation- I with walker  Managing Meds- I  Follow up appointments reviewed:   PCP Hospital f/u appt confirmed? Yes  Scheduled to see Dr. Ancil Boozer on 05/04/20 @ 2:20 for virtual appt. .  Are transportation arrangements needed? No   If their condition worsens, is the pt aware to call PCP or go to the Emergency Dept.? Yes  Was the patient provided with contact information for the PCP's office or ED? Yes  Was to pt encouraged to call back with questions or concerns? Yes

## 2020-05-03 NOTE — Progress Notes (Signed)
Name: [name]   MRN: [id number]    DOB: 03-27-1966   Date:05/04/2020       Progress Note  Subjective  Chief Complaint  Chief Complaint  Patient presents with   Medication Refill    [facility] discharge follow up: she went to [facility] on 08/25 with increase in sob, dizziness, cough, weakness. She did not have COVID -19 vaccine, she was diagnosed with COVID-19 and admitted for hypoxia and pneumonia , she was found to be hypoxic with pulse ox of 90 % on 2 liters of oxygen, CT chest negative for PE, positive for ground glass opatcities and mild mediastinal enlargement likely reactive, also has aorta atherosclerosis . She had acute kidney failure but GFR improved prior to discharge.  She finished course of Remdesivir, still on steroids, on vitamin C and zinc. During her hospital stay glucose was high, she was on steroids, she states since she has been home she states glucose has been below 200. She is on 2 liters of oxygen, still having SOB with activity , still has a dry cough, she still has dizziness when talking a lot or moving around but not prevent during rest, but better than when she went to [facility].    Patient Active Problem List   Diagnosis Date Noted   Iron deficiency anemia due to chronic blood loss    Polyp of ascending colon    Chronic gastric ulcer with hemorrhage    Type 2 diabetes with decreased circulation ([place]) 11/07/2018   Vitamin B12 deficiency 08/08/2018   Encounter for Medicare annual wellness exam 10/30/2017   Patient is full code 10/30/2017   Disorder of bursae of shoulder region 10/09/2017   Coronary artery disease 10/09/2017   Plantar fascial fibromatosis 10/09/2017   Tendinitis of wrist 10/09/2017   Schizoaffective disorder, bipolar type ([place]) 09/28/2017   Chest pain 09/10/2017   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 09/28/2016   Breast cancer screening 09/28/2016   Fatigue 05/01/2016   Adenomatous colon polyp 10/15/2015   Bipolar  affective disorder ([place]) 10/15/2015   CN (constipation) 10/15/2015   Drug-induced hepatic toxicity 10/15/2015   Blood in feces 10/15/2015   HLD (hyperlipidemia) 10/15/2015   Hypoxemia 10/15/2015   NASH (nonalcoholic steatohepatitis) 10/15/2015   Morbid obesity ([place]) 10/15/2015   Restless leg syndrome 10/15/2015   Type 2 diabetes, uncontrolled, with neuropathy ([place]) 10/15/2015   Elevated alkaline phosphatase level 09/22/2015   Low back pain with left-sided sciatica 08/18/2015   Encounter for monitoring tricyclic antidepressant therapy 05/13/2015   Bipolar disorder ([place]) 05/11/2015   Apnea, sleep 05/05/2015   Heart valve disease 05/05/2015   Chronic kidney disease, stage III (moderate) 03/15/2015   Migraine 03/12/2015   COPD (chronic obstructive pulmonary disease) ([place]) 01/20/2015   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[place]) 01/20/2015   GERD (gastroesophageal reflux disease) 01/20/2015   Hypothyroid    Vitamin D deficiency    Urinary incontinence    Liver lesion    Hypomagnesemia    Obstructive sleep apnea 10/20/2013   Vaginitis 12/19/2011   PERIPHERAL NEUROPATHY 03/04/2009   HEMORRHOIDS, EXTERNAL 09/03/2008   ASTHMA, PERSISTENT, MODERATE 07/04/2007   INTERSTITIAL CYSTITIS 07/04/2007   Allergic rhinitis 07/04/2007   HYPERTENSION, BENIGN ESSENTIAL 06/19/2007    Past Surgical History:  Procedure Laterality Date   ABDOMINAL HYSTERECTOMY     Partial   bladder stim  2007   BLADDER SURGERY     x 2. tacking of bladder and the other was the stimulator placement  CHOLECYSTECTOMY     COLONOSCOPY WITH PROPOFOL N/A 03/04/2019   Procedure: COLONOSCOPY WITH PROPOFOL;  Surgeon: [name], MD;  Location: [facility];  Service: Endoscopy;  Laterality: N/A;   ESOPHAGOGASTRODUODENOSCOPY (EGD) WITH PROPOFOL N/A 03/04/2019   Procedure: ESOPHAGOGASTRODUODENOSCOPY (EGD) WITH PROPOFOL;  Surgeon: [name], MD;  Location: [facility];  Service: Endoscopy;   Laterality: N/A;   INCISION AND DRAINAGE ABSCESS Right 04/16/2019   Procedure: INCISION AND DRAINAGE ABSCESS;  Surgeon: Benjamine Sprague, DO;  Location: [facility];  Service: General;  Laterality: Right;   INTERSTIM-GENERATOR CHANGE N/A 12/31/2017   Procedure: INTERSTIM-GENERATOR CHANGE;  Surgeon: Bjorn Loser, MD;  Location: [facility];  Service: Urology;  Laterality: N/A;   LIVER BIOPSY  March 2016   Negative   LUNG BIOPSY  2015   small spot on lung   TUBAL LIGATION     UPPER GI ENDOSCOPY     WRIST SURGERY Right 11/02/2019    Family History  Problem Relation Age of Onset   Diabetes Mother    Heart disease Mother    Hyperlipidemia Mother    Hypertension Mother    Kidney disease Mother    Mental illness Mother    Hypothyroidism Mother    Stroke Mother    Osteoporosis Mother    Glaucoma Mother    Congestive Heart Failure Mother    Hypertension Father    Asthma Daughter    Cancer Daughter        throat   Bipolar disorder Daughter    Heart disease Maternal Grandfather    Rheum arthritis Maternal Grandfather    Cancer Maternal Grandfather        liver   Kidney disease Maternal Grandfather    Cancer Paternal Grandmother        lung   Kidney disease Brother    Breast cancer Maternal Grandmother 50   Bipolar disorder Daughter    Hypertension Daughter    Restless legs syndrome Daughter     Social History   Tobacco Use   Smoking status: Never Smoker   Smokeless tobacco: Never Used  Substance Use Topics   Alcohol use: No     Current Outpatient Medications:    albuterol (VENTOLIN HFA) 108 (90 Base) MCG/ACT inhaler, Inhale 2 puffs into the lungs every 6 (six) hours as needed for wheezing or shortness of breath., Disp: 18 g, Rfl: 3   atorvastatin (LIPITOR) 80 MG tablet, Take 80 mg by mouth at bedtime. , Disp: , Rfl:    busPIRone (BUSPAR) 10 MG tablet, Take 20 mg by mouth 2 (two) times daily. , Disp: , Rfl:    citalopram (CELEXA) 20 MG  tablet, Take 20 mg by mouth daily., Disp: , Rfl:    dexamethasone (DECADRON) 2 MG tablet, Take 2 mg by mouth 3 (three) times daily., Disp: , Rfl:    dexamethasone (DECADRON) 6 MG tablet, Take 1 tablet (6 mg total) by mouth daily for 5 days., Disp: 5 tablet, Rfl: 0   dextromethorphan-guaiFENesin (MUCINEX DM) 30-600 MG 12hr tablet, Take 1 tablet by mouth 2 (two) times daily., Disp: 30 tablet, Rfl: 0   empagliflozin (JARDIANCE) 25 MG TABS tablet, Take 25 mg by mouth daily., Disp: , Rfl:    ezetimibe (ZETIA) 10 MG tablet, Take 10 mg by mouth daily. , Disp: , Rfl:    fenofibrate micronized (LOFIBRA) 134 MG capsule, Take 134 mg by mouth daily., Disp: , Rfl:    fluticasone (FLONASE) 50 MCG/ACT nasal spray, Place 2 sprays into both nostrils  daily., Disp: 16 g, Rfl: 0   gabapentin (NEURONTIN) 300 MG capsule, Take 1-2 capsules (300-600 mg total) by mouth 3 (three) times daily. Take 1 capsule each morning, 1 capsule each afternoon, and 2 capsules at bedtime., Disp: 360 capsule, Rfl: 0   hydrOXYzine (VISTARIL) 50 MG capsule, Take 50 mg by mouth 3 (three) times daily as needed for Itching, Disp: , Rfl:    insulin aspart (NOVOLOG FLEXPEN) 100 UNIT/ML FlexPen, Inject 3 Units subcutaneously. Sliding scale., Disp: , Rfl:    insulin regular human CONCENTRATED (HUMULIN R) 500 UNIT/ML injection, Inject 70 Units into the skin 3 (three) times daily with meals., Disp: , Rfl:    lamoTRIgine (LAMICTAL) 200 MG tablet, Take 200 mg by mouth 2 (two) times daily. , Disp: , Rfl:    levothyroxine (SYNTHROID) 50 MCG tablet, TAKE ONE TABLET DAILY 3 DAYS A WEEK, THEN TAKE ONE AND ONE-HALF TABLETS FOUR DAYS A WEEK, Disp: 108 tablet, Rfl: 0   lisinopril (ZESTRIL) 2.5 MG tablet, TAKE 1 TABLET BY MOUTH EVERY DAY, Disp: 90 tablet, Rfl: 1   Omega-3 Fatty Acids (FISH OIL PO), Take by mouth daily., Disp: , Rfl:    ondansetron (ZOFRAN) 4 MG tablet, TAKE 1 TABLET BY MOUTH EVERY 8 HOURS AS NEEDED FOR NAUSEA OR VOMITING (Patient  taking differently: Take 4 mg by mouth every 8 (eight) hours as needed for nausea or vomiting. ), Disp: 20 tablet, Rfl: 2   pantoprazole (PROTONIX) 40 MG tablet, Take 1 tablet (40 mg total) by mouth daily., Disp: 90 tablet, Rfl: 3   polyethylene glycol (MIRALAX) 17 g packet, Take 17 g by mouth daily. (Patient taking differently: Take 17 g by mouth daily as needed for mild constipation. ), Disp: 14 each, Rfl: 0   QUEtiapine (SEROQUEL) 200 MG tablet, Take 200 mg by mouth at bedtime., Disp: , Rfl:    traZODone (DESYREL) 100 MG tablet, Take 100 mg by mouth at bedtime., Disp: , Rfl:    vitamin C (ASCORBIC ACID) 250 MG tablet, Take 1 tablet (250 mg total) by mouth daily., Disp: 30 tablet, Rfl: 0   Zinc 220 (50 Zn) MG CAPS, Take 1 tablet by mouth daily., Disp: 30 capsule, Rfl: 0  Allergies  Allergen Reactions   Mirtazapine Anaphylaxis    REACTION: closes throat   Morphine And Related     anaphylaxis   Sulfamethoxazole-Trimethoprim Rash    REACTION: Whelps all over   Amoxicillin Er Nausea And Vomiting    Has patient had a PCN reaction causing immediate rash, facial/tongue/throat swelling, SOB or lightheadedness with hypotension: No Has patient had a PCN reaction causing severe rash involving mucus membranes or skin necrosis: No Has patient had a PCN reaction that required hospitalization: No Has patient had a PCN reaction occurring within the last 10 years: Yes If all of the above answers are "NO", then may proceed with Cephalosporin use.   Codeine Itching    REACTION: itch, rash   Hydrocodone-Acetaminophen Rash    REACTION: rash   Keflex [Cephalexin] Rash   Prasterone Nausea And Vomiting    I personally reviewed active problem list, medication list, allergies, family history, social history, health maintenance with the patient/caregiver today.   ROS  Ten systems reviewed and is negative except as mentioned in HPI  Objective  There were no vitals filed for this  visit.  There is no height or weight on file to calculate BMI.  Physical Exam  Awake, alert and oriented   Recent Results (from the  past 2160 hour(s))  Comprehensive metabolic panel     Status: Abnormal   Collection Time: 03/31/20  4:34 PM  Result Value Ref Range   Sodium 141 135 - 145 mmol/L   Potassium 3.8 3.5 - 5.1 mmol/L   Chloride 106 98 - 111 mmol/L   CO2 24 22 - 32 mmol/L   Glucose, Bld 251 (H) 70 - 99 mg/dL    Comment: Glucose reference range applies only to samples taken after fasting for at least 8 hours.   BUN 28 (H) 6 - 20 mg/dL   Creatinine, Ser 1.31 (H) 0.44 - 1.00 mg/dL   Calcium 8.8 (L) 8.9 - 10.3 mg/dL   Total Protein 6.5 6.5 - 8.1 g/dL   Albumin 3.8 3.5 - 5.0 g/dL   AST 40 15 - 41 U/L   ALT 39 0 - 44 U/L   Alkaline Phosphatase 54 38 - 126 U/L   Total Bilirubin 0.6 0.3 - 1.2 mg/dL   GFR calc non Af Amer 45 (L) >60 mL/min   GFR calc Af Amer 53 (L) >60 mL/min   Anion gap 11 5 - 15    Comment: Performed at [facility], [address], [place], [place] [address]  Ethanol     Status: None   Collection Time: 03/31/20  4:34 PM  Result Value Ref Range   Alcohol, Ethyl (B) <10 <10 mg/dL    Comment: (NOTE) Lowest detectable limit for serum alcohol is 10 mg/dL.  For medical purposes only. Performed at [facility], [address] [place], [place] [address]   Salicylate level     Status: Abnormal   Collection Time: 03/31/20  4:34 PM  Result Value Ref Range   Salicylate Lvl <6.9 (L) 7.0 - 30.0 mg/dL    Comment: Performed at [facility], [address] [place], [place] [address]  Acetaminophen level     Status: Abnormal   Collection Time: 03/31/20  4:34 PM  Result Value Ref Range   Acetaminophen (Tylenol), Serum <10 (L) 10 - 30 ug/mL    Comment: (NOTE) Therapeutic concentrations vary significantly. A range of 10-30 ug/mL  may be an effective concentration for many patients. However, some  are best treated at concentrations  outside of this range. Acetaminophen concentrations >150 ug/mL at 4 hours after ingestion  and >50 ug/mL at 12 hours after ingestion are often associated with  toxic reactions.  Performed at [facility], [address] [place], [place] [address]   cbc     Status: Abnormal   Collection Time: 03/31/20  4:34 PM  Result Value Ref Range   WBC 8.6 4.0 - 10.5 K/uL   RBC 3.83 (L) 3.87 - 5.11 MIL/uL   Hemoglobin 11.1 (L) 12.0 - 15.0 g/dL   HCT 36.2 36 - 46 %   MCV 94.5 80.0 - 100.0 fL   MCH 29.0 26.0 - 34.0 pg   MCHC 30.7 30.0 - 36.0 g/dL   RDW 14.3 11.5 - 15.5 %   Platelets 330 150 - 400 K/uL   nRBC 0.0 0.0 - 0.2 %    Comment: Performed at [facility], [address] [place], [place] [address]  Urine Drug Screen, Qualitative     Status: None   Collection Time: 03/31/20  4:34 PM  Result Value Ref Range   Tricyclic, Ur Screen NONE DETECTED NONE DETECTED   Amphetamines, Ur Screen NONE DETECTED NONE DETECTED   MDMA (Ecstasy)Ur Screen NONE DETECTED NONE DETECTED  Cocaine Metabolite,Ur [place] NONE DETECTED NONE DETECTED   Opiate, Ur Screen NONE DETECTED NONE DETECTED   Phencyclidine (PCP) Ur S NONE DETECTED NONE DETECTED   Cannabinoid 50 Ng, Ur [place] NONE DETECTED NONE DETECTED   Barbiturates, Ur Screen NONE DETECTED NONE DETECTED   Benzodiazepine, Ur Scrn NONE DETECTED NONE DETECTED   Methadone Scn, Ur NONE DETECTED NONE DETECTED    Comment: (NOTE) Tricyclics + metabolites, urine    Cutoff 1000 ng/mL Amphetamines + metabolites, urine  Cutoff 1000 ng/mL MDMA (Ecstasy), urine              Cutoff 500 ng/mL Cocaine Metabolite, urine          Cutoff 300 ng/mL Opiate + metabolites, urine        Cutoff 300 ng/mL Phencyclidine (PCP), urine         Cutoff 25 ng/mL Cannabinoid, urine                 Cutoff 50 ng/mL Barbiturates + metabolites, urine  Cutoff 200 ng/mL Benzodiazepine, urine              Cutoff 200 ng/mL Methadone, urine                   Cutoff 300 ng/mL  The  urine drug screen provides only a preliminary, unconfirmed analytical test result and should not be used for non-medical purposes. Clinical consideration and professional judgment should be applied to any positive drug screen result due to possible interfering substances. A more specific alternate chemical method must be used in order to obtain a confirmed analytical result. Gas chromatography / mass spectrometry (GC/MS) is the preferred confirm atory method. Performed at [facility], [address] [place], [place] [address]   Hemoglobin A1c     Status: Abnormal   Collection Time: 03/31/20  4:34 PM  Result Value Ref Range   Hgb A1c MFr Bld 10.3 (H) 4.8 - 5.6 %    Comment: (NOTE) Pre diabetes:          5.7%-6.4%  Diabetes:              >6.4%  Glycemic control for   <7.0% adults with diabetes    Mean Plasma Glucose 248.91 mg/dL    Comment: Performed at [place] [facility] [address], [place], [place] [address]  Glucose, capillary     Status: Abnormal   Collection Time: 03/31/20  7:50 PM  Result Value Ref Range   Glucose-Capillary 244 (H) 70 - 99 mg/dL    Comment: Glucose reference range applies only to samples taken after fasting for at least 8 hours.  SARS Coronavirus 2 by RT PCR (hospital order, performed in [facility] hospital lab) Nasopharyngeal Nasopharyngeal Swab     Status: None   Collection Time: 04/01/20  2:55 AM   Specimen: Nasopharyngeal Swab  Result Value Ref Range   SARS Coronavirus 2 NEGATIVE NEGATIVE    Comment: (NOTE) SARS-CoV-2 target nucleic acids are NOT DETECTED.  The SARS-CoV-2 RNA is generally detectable in upper and lower respiratory specimens during the acute phase of infection. The lowest concentration of SARS-CoV-2 viral copies this assay can detect is 250 copies / mL. A negative result does not preclude SARS-CoV-2 infection and should not be used as the sole basis for treatment or other patient management decisions.  A negative  result may occur with improper specimen collection / handling, submission of specimen other than nasopharyngeal swab, presence of viral mutation(s) within the  areas targeted by this assay, and inadequate number of viral copies (<250 copies / mL). A negative result must be combined with clinical observations, patient history, and epidemiological information.  Fact Sheet for Patients:   StrictlyIdeas.no  Fact Sheet for Healthcare Providers: BankingDealers.co.za  This test is not yet approved or  cleared by the [place] FDA and has been authorized for detection and/or diagnosis of SARS-CoV-2 by FDA under an Emergency Use Authorization (EUA).  This EUA will remain in effect (meaning this test can be used) for the duration of the COVID-19 declaration under Section 564(b)(1) of the Act, 21 U.S.C. section 360bbb-3(b)(1), unless the authorization is terminated or revoked sooner.  Performed at [facility], [address] [place], [place] [address]   Glucose, capillary     Status: Abnormal   Collection Time: 04/01/20  8:33 AM  Result Value Ref Range   Glucose-Capillary 247 (H) 70 - 99 mg/dL    Comment: Glucose reference range applies only to samples taken after fasting for at least 8 hours.   Comment 1 Notify RN   Basic metabolic panel     Status: Abnormal   Collection Time: 04/28/20  9:31 AM  Result Value Ref Range   Sodium 135 135 - 145 mmol/L   Potassium 4.6 3.5 - 5.1 mmol/L   Chloride 99 98 - 111 mmol/L   CO2 22 22 - 32 mmol/L   Glucose, Bld 247 (H) 70 - 99 mg/dL    Comment: Glucose reference range applies only to samples taken after fasting for at least 8 hours.   BUN 35 (H) 6 - 20 mg/dL   Creatinine, Ser 1.82 (H) 0.44 - 1.00 mg/dL   Calcium 8.8 (L) 8.9 - 10.3 mg/dL   GFR calc non Af Amer 31 (L) >60 mL/min   GFR calc Af Amer 35 (L) >60 mL/min   Anion gap 14 5 - 15    Comment: Performed at [facility],  [address] [place], [place] [address]  CBC     Status: Abnormal   Collection Time: 04/28/20  9:31 AM  Result Value Ref Range   WBC 3.3 (L) 4.0 - 10.5 K/uL   RBC 3.81 (L) 3.87 - 5.11 MIL/uL   Hemoglobin 11.0 (L) 12.0 - 15.0 g/dL   HCT 35.1 (L) 36 - 46 %   MCV 92.1 80.0 - 100.0 fL   MCH 28.9 26.0 - 34.0 pg   MCHC 31.3 30.0 - 36.0 g/dL   RDW 15.0 11.5 - 15.5 %   Platelets 167 150 - 400 K/uL   nRBC 0.0 0.0 - 0.2 %    Comment: Performed at [facility], [address] [place], [place] [address]  CBC with Differential/Platelet     Status: Abnormal   Collection Time: 04/28/20  9:31 AM  Result Value Ref Range   WBC 3.4 (L) 4.0 - 10.5 K/uL   RBC 3.76 (L) 3.87 - 5.11 MIL/uL   Hemoglobin 11.0 (L) 12.0 - 15.0 g/dL   HCT 35.3 (L) 36 - 46 %   MCV 93.9 80.0 - 100.0 fL   MCH 29.3 26.0 - 34.0 pg   MCHC 31.2 30.0 - 36.0 g/dL   RDW 15.1 11.5 - 15.5 %   Platelets 162 150 - 400 K/uL   nRBC 0.0 0.0 - 0.2 %   Neutrophils Relative % 58 %   Neutro Abs 2.0 1.7 - 7.7 K/uL   Lymphocytes Relative 31 %   Lymphs Abs 1.1 0.7 -  4.0 K/uL   Monocytes Relative 10 %   Monocytes Absolute 0.3 0 - 1 K/uL   Eosinophils Relative 0 %   Eosinophils Absolute 0.0 0 - 0 K/uL   Basophils Relative 0 %   Basophils Absolute 0.0 0 - 0 K/uL   Immature Granulocytes 1 %   Abs Immature Granulocytes 0.03 0.00 - 0.07 K/uL    Comment: Performed at [facility], [address] [place], [place] [address]  SARS Coronavirus 2 by RT PCR (hospital order, performed in [facility] hospital lab) Nasopharyngeal Nasopharyngeal Swab     Status: Abnormal   Collection Time: 04/28/20  2:44 PM   Specimen: Nasopharyngeal Swab  Result Value Ref Range   SARS Coronavirus 2 POSITIVE (A) NEGATIVE    Comment: RESULT CALLED TO, READ BACK BY AND VERIFIED WITH: JADEKA MANGRUM RN AT 6203 ON 04/28/20 SNG  (NOTE) SARS-CoV-2 target nucleic acids are DETECTED  SARS-CoV-2 RNA is generally detectable in upper respiratory  specimens  during the acute phase of infection.  Positive results are indicative  of the presence of the identified virus, but do not rule out bacterial infection or co-infection with other pathogens not detected by the test.  Clinical correlation with patient history and  other diagnostic information is necessary to determine patient infection status.  The expected result is negative.  Fact Sheet for Patients:   StrictlyIdeas.no   Fact Sheet for Healthcare Providers:   BankingDealers.co.za    This test is not yet approved or cleared by the [place] FDA and  has been authorized for detection and/or diagnosis of SARS-CoV-2 by FDA under an Emergency Use Authorization (EUA).  This EUA will remain in effect (meaning  this test can be used) for the duration of  the COVID-19 declaration under Section 564(b)(1) of the Act, 21 U.S.C. section 360-bbb-3(b)(1), unless the authorization is terminated or revoked sooner.  Performed at [facility], [address] [place], [place] [address]   Urinalysis, Complete w Microscopic Nasopharyngeal Swab     Status: Abnormal   Collection Time: 04/28/20  3:01 PM  Result Value Ref Range   Color, Urine YELLOW (A) YELLOW   APPearance HAZY (A) CLEAR   Specific Gravity, Urine 1.026 1.005 - 1.030   pH 5.0 5.0 - 8.0   Glucose, UA >=500 (A) NEGATIVE mg/dL   Hgb urine dipstick SMALL (A) NEGATIVE   Bilirubin Urine NEGATIVE NEGATIVE   Ketones, ur 20 (A) NEGATIVE mg/dL   Protein, ur NEGATIVE NEGATIVE mg/dL   Nitrite NEGATIVE NEGATIVE   Leukocytes,Ua NEGATIVE NEGATIVE   RBC / HPF 0-5 0 - 5 RBC/hpf   WBC, UA 0-5 0 - 5 WBC/hpf   Bacteria, UA NONE SEEN NONE SEEN   Squamous Epithelial / LPF 0-5 0 - 5   Mucus PRESENT     Comment: Performed at [facility], [address] [place], [place] [address]  Lactic acid, plasma     Status: None   Collection Time: 04/28/20  4:14 PM  Result Value Ref  Range   Lactic Acid, Venous 1.2 0.5 - 1.9 mmol/L    Comment: Performed at [facility], [address] [place], [place] [address]  Fibrinogen     Status: None   Collection Time: 04/28/20  4:14 PM  Result Value Ref Range   Fibrinogen 475 210 - 475 mg/dL    Comment: Performed at [facility], [address], [place], [place] [address]  Procalcitonin - Baseline  Status: None   Collection Time: 04/28/20  4:14 PM  Result Value Ref Range   Procalcitonin <0.10 ng/mL    Comment:        Interpretation: PCT (Procalcitonin) <= 0.5 ng/mL: Systemic infection (sepsis) is not likely. Local bacterial infection is possible. (NOTE)       Sepsis PCT Algorithm           Lower Respiratory Tract                                      Infection PCT Algorithm    ----------------------------     ----------------------------         PCT < 0.25 ng/mL                PCT < 0.10 ng/mL          Strongly encourage             Strongly discourage   discontinuation of antibiotics    initiation of antibiotics    ----------------------------     -----------------------------       PCT 0.25 - 0.50 ng/mL            PCT 0.10 - 0.25 ng/mL               OR       >80% decrease in PCT            Discourage initiation of                                            antibiotics      Encourage discontinuation           of antibiotics    ----------------------------     -----------------------------         PCT >= 0.50 ng/mL              PCT 0.26 - 0.50 ng/mL               AND        <80% decrease in PCT             Encourage initiation of                                             antibiotics       Encourage continuation           of antibiotics    ----------------------------     -----------------------------        PCT >= 0.50 ng/mL                  PCT > 0.50 ng/mL               AND         increase in PCT                  Strongly encourage                                      initiation of  antibiotics  Strongly encourage escalation           of antibiotics                                     -----------------------------                                           PCT <= 0.25 ng/mL                                                 OR                                        > 80% decrease in PCT                                      Discontinue / Do not initiate                                             antibiotics  Performed at [facility], [address] [place], [place] [address]   Fibrin derivatives D-Dimer [facility] only)     Status: Abnormal   Collection Time: 04/28/20  4:14 PM  Result Value Ref Range   Fibrin derivatives D-dimer ([facility]) 653.87 (H) 0.00 - 499.00 ng/mL (FEU)    Comment: (NOTE) <> Exclusion of Venous Thromboembolism (VTE) - OUTPATIENT ONLY   (Emergency Department or [place])    0-499 ng/ml (FEU): With a low to intermediate pretest probability                      for VTE this test result excludes the diagnosis                      of VTE.   >499 ng/ml (FEU) : VTE not excluded; additional work up for VTE is                      required.  <> Testing on Inpatients and Evaluation of Disseminated Intravascular   Coagulation (DIC) Reference Range:   0-499 ng/ml (FEU) Performed at [facility], [address] [place], [place] [address]   Glucose, capillary     Status: Abnormal   Collection Time: 04/28/20 11:00 PM  Result Value Ref Range   Glucose-Capillary 273 (H) 70 - 99 mg/dL    Comment: Glucose reference range applies only to samples taken after fasting for at least 8 hours.  CBC with Differential/Platelet     Status: Abnormal   Collection Time: 04/29/20  5:53 AM  Result Value Ref Range   WBC 2.9 (L) 4.0 - 10.5 K/uL   RBC 3.39 (L) 3.87 - 5.11 MIL/uL   Hemoglobin 9.8 (L) 12.0 - 15.0 g/dL   HCT 31.8 (L) 36 - 46 %   MCV 93.8 80.0 -  100.0 fL   MCH 28.9 26.0 - 34.0 pg   MCHC 30.8 30.0 - 36.0 g/dL   RDW 15.0 11.5 - 15.5 %    Platelets 137 (L) 150 - 400 K/uL   nRBC 0.0 0.0 - 0.2 %   Neutrophils Relative % 52 %   Neutro Abs 1.5 (L) 1.7 - 7.7 K/uL   Lymphocytes Relative 39 %   Lymphs Abs 1.1 0.7 - 4.0 K/uL   Monocytes Relative 7 %   Monocytes Absolute 0.2 0 - 1 K/uL   Eosinophils Relative 0 %   Eosinophils Absolute 0.0 0 - 0 K/uL   Basophils Relative 0 %   Basophils Absolute 0.0 0 - 0 K/uL   Immature Granulocytes 2 %   Abs Immature Granulocytes 0.06 0.00 - 0.07 K/uL    Comment: Performed at [facility], [address] [place], [place] [address]  Comprehensive metabolic panel     Status: Abnormal   Collection Time: 04/29/20  5:53 AM  Result Value Ref Range   Sodium 140 135 - 145 mmol/L   Potassium 5.0 3.5 - 5.1 mmol/L   Chloride 106 98 - 111 mmol/L   CO2 18 (L) 22 - 32 mmol/L   Glucose, Bld 290 (H) 70 - 99 mg/dL    Comment: Glucose reference range applies only to samples taken after fasting for at least 8 hours.   BUN 48 (H) 6 - 20 mg/dL   Creatinine, Ser 1.80 (H) 0.44 - 1.00 mg/dL   Calcium 7.7 (L) 8.9 - 10.3 mg/dL   Total Protein 5.8 (L) 6.5 - 8.1 g/dL   Albumin 3.2 (L) 3.5 - 5.0 g/dL   AST 98 (H) 15 - 41 U/L   ALT 58 (H) 0 - 44 U/L   Alkaline Phosphatase 41 38 - 126 U/L   Total Bilirubin 2.2 (H) 0.3 - 1.2 mg/dL   GFR calc non Af Amer 31 (L) >60 mL/min   GFR calc Af Amer 36 (L) >60 mL/min   Anion gap 16 (H) 5 - 15    Comment: Performed at [facility], [address] [place], [place] [address]  C-reactive protein     Status: Abnormal   Collection Time: 04/29/20  5:53 AM  Result Value Ref Range   CRP 4.5 (H) <1.0 mg/dL    Comment: Performed at [facility], [place] [address], [place],  [address]  Fibrin derivatives D-Dimer [facility] only)     Status: Abnormal   Collection Time: 04/29/20  5:53 AM  Result Value Ref Range   Fibrin derivatives D-dimer ([facility]) 608.84 (H) 0.00 - 499.00 ng/mL (FEU)    Comment: (NOTE) <> Exclusion of Venous Thromboembolism (VTE) -  OUTPATIENT ONLY   (Emergency Department or [place])    0-499 ng/ml (FEU): With a low to intermediate pretest probability                      for VTE this test result excludes the diagnosis                      of VTE.   >499 ng/ml (FEU) : VTE not excluded; additional work up for VTE is                      required.  <> Testing on Inpatients and Evaluation of Disseminated Intravascular   Coagulation (DIC) Reference Range:   0-499 ng/ml (FEU) Performed at [facility], [place], [place] [address]   Ferritin     Status: Abnormal   Collection Time: 04/29/20  5:53 AM  Result Value Ref Range   Ferritin 654 (H) 11 - 307 ng/mL    Comment: Performed at [facility], [address] [place], [place] [address]  Phosphorus     Status: Abnormal   Collection Time: 04/29/20  5:53 AM  Result Value Ref Range   Phosphorus 6.6 (H) 2.5 - 4.6 mg/dL    Comment: Performed at [facility] [address] [place], [place] [address]  Magnesium     Status: Abnormal   Collection Time: 04/29/20  5:53 AM  Result Value Ref Range   Magnesium 2.6 (H) 1.7 - 2.4 mg/dL    Comment: Performed at [facility], [address] [place], [place] [address]  Glucose, capillary     Status: Abnormal   Collection Time: 04/29/20 12:00 PM  Result Value Ref Range   Glucose-Capillary 324 (H) 70 - 99 mg/dL    Comment: Glucose reference range applies only to samples taken after fasting for at least 8 hours.  Glucose, capillary     Status: Abnormal   Collection Time: 04/29/20  6:18 PM  Result Value Ref Range   Glucose-Capillary 260 (H) 70 - 99 mg/dL    Comment: Glucose reference range applies only to samples taken after fasting for at least 8 hours.  Glucose, capillary     Status: Abnormal   Collection Time: 04/29/20  8:18 PM  Result Value Ref Range   Glucose-Capillary 178 (H) 70 - 99 mg/dL    Comment: Glucose reference range applies only to samples taken after fasting  for at least 8 hours.  CBC with Differential/Platelet     Status: Abnormal   Collection Time: 04/30/20  4:18 AM  Result Value Ref Range   WBC 3.1 (L) 4.0 - 10.5 K/uL   RBC 3.29 (L) 3.87 - 5.11 MIL/uL   Hemoglobin 9.6 (L) 12.0 - 15.0 g/dL   HCT 29.4 (L) 36 - 46 %   MCV 89.4 80.0 - 100.0 fL   MCH 29.2 26.0 - 34.0 pg   MCHC 32.7 30.0 - 36.0 g/dL   RDW 15.3 11.5 - 15.5 %   Platelets 151 150 - 400 K/uL   nRBC 0.0 0.0 - 0.2 %   Neutrophils Relative % 68 %   Neutro Abs 2.1 1.7 - 7.7 K/uL   Lymphocytes Relative 24 %   Lymphs Abs 0.7 0.7 - 4.0 K/uL   Monocytes Relative 6 %   Monocytes Absolute 0.2 0 - 1 K/uL   Eosinophils Relative 0 %   Eosinophils Absolute 0.0 0 - 0 K/uL   Basophils Relative 0 %   Basophils Absolute 0.0 0 - 0 K/uL   Immature Granulocytes 2 %   Abs Immature Granulocytes 0.06 0.00 - 0.07 K/uL    Comment: Performed at [facility], [address] [place],  [address]  Comprehensive metabolic panel     Status: Abnormal   Collection Time: 04/30/20  4:18 AM  Result Value Ref Range   Sodium 139 135 - 145 mmol/L   Potassium 4.9 3.5 - 5.1 mmol/L   Chloride 105 98 - 111 mmol/L   CO2 22 22 - 32 mmol/L   Glucose, Bld 242 (H) 70 - 99 mg/dL    Comment: Glucose reference range applies only to samples taken after fasting for at least 8 hours.   BUN 38 (  H) 6 - 20 mg/dL   Creatinine, Ser 1.41 (H) 0.44 - 1.00 mg/dL   Calcium 8.1 (L) 8.9 - 10.3 mg/dL   Total Protein 5.4 (L) 6.5 - 8.1 g/dL   Albumin 3.0 (L) 3.5 - 5.0 g/dL   AST 71 (H) 15 - 41 U/L   ALT 50 (H) 0 - 44 U/L   Alkaline Phosphatase 37 (L) 38 - 126 U/L   Total Bilirubin 1.5 (H) 0.3 - 1.2 mg/dL   GFR calc non Af Amer 42 (L) >60 mL/min   GFR calc Af Amer 48 (L) >60 mL/min   Anion gap 12 5 - 15    Comment: Performed at [facility], [address] [place], [place] [address]  C-reactive protein     Status: Abnormal   Collection Time: 04/30/20  4:18 AM  Result Value Ref Range   CRP 3.9 (H) <1.0  mg/dL    Comment: Performed at [facility], [place] [address] [place], [place] [address]  Fibrin derivatives D-Dimer [facility] only)     Status: Abnormal   Collection Time: 04/30/20  4:18 AM  Result Value Ref Range   Fibrin derivatives D-dimer ([facility]) 553.24 (H) 0.00 - 499.00 ng/mL (FEU)    Comment: (NOTE) <> Exclusion of Venous Thromboembolism (VTE) - OUTPATIENT ONLY   (Emergency Department or [place])    0-499 ng/ml (FEU): With a low to intermediate pretest probability                      for VTE this test result excludes the diagnosis                      of VTE.   >499 ng/ml (FEU) : VTE not excluded; additional work up for VTE is                      required.  <> Testing on Inpatients and Evaluation of Disseminated Intravascular   Coagulation (DIC) Reference Range:   0-499 ng/ml (FEU) Performed at [facility], [address] [place], [place] [address]   Ferritin     Status: Abnormal   Collection Time: 04/30/20  4:18 AM  Result Value Ref Range   Ferritin 663 (H) 11 - 307 ng/mL    Comment: Performed at [facility], [address] [place], [place] [address]  Phosphorus     Status: None   Collection Time: 04/30/20  4:18 AM  Result Value Ref Range   Phosphorus 3.8 2.5 - 4.6 mg/dL    Comment: Performed at [facility], [address], [place], [place] [address]  Magnesium     Status: Abnormal   Collection Time: 04/30/20  4:18 AM  Result Value Ref Range   Magnesium 2.8 (H) 1.7 - 2.4 mg/dL    Comment: Performed at [facility], [address] [place], [place] [address]  Glucose, capillary     Status: Abnormal   Collection Time: 04/30/20  8:03 AM  Result Value Ref Range   Glucose-Capillary 209 (H) 70 - 99 mg/dL    Comment: Glucose reference range applies only to samples taken after fasting for at least 8 hours.  Glucose, capillary     Status: Abnormal   Collection Time: 04/30/20 11:47 AM  Result Value Ref Range   Glucose-Capillary  290 (H) 70 - 99 mg/dL    Comment: Glucose reference range applies only to samples taken after fasting for  at least 8 hours.  Glucose, capillary     Status: Abnormal   Collection Time: 04/30/20  4:49 PM  Result Value Ref Range   Glucose-Capillary 230 (H) 70 - 99 mg/dL    Comment: Glucose reference range applies only to samples taken after fasting for at least 8 hours.  Glucose, capillary     Status: Abnormal   Collection Time: 04/30/20  8:56 PM  Result Value Ref Range   Glucose-Capillary 238 (H) 70 - 99 mg/dL    Comment: Glucose reference range applies only to samples taken after fasting for at least 8 hours.  CBC with Differential/Platelet     Status: Abnormal   Collection Time: 05/01/20  4:38 AM  Result Value Ref Range   WBC 3.4 (L) 4.0 - 10.5 K/uL   RBC 3.46 (L) 3.87 - 5.11 MIL/uL   Hemoglobin 10.1 (L) 12.0 - 15.0 g/dL   HCT 30.9 (L) 36 - 46 %   MCV 89.3 80.0 - 100.0 fL   MCH 29.2 26.0 - 34.0 pg   MCHC 32.7 30.0 - 36.0 g/dL   RDW 15.4 11.5 - 15.5 %   Platelets 165 150 - 400 K/uL   nRBC 0.0 0.0 - 0.2 %   Neutrophils Relative % 66 %   Neutro Abs 2.2 1.7 - 7.7 K/uL   Lymphocytes Relative 24 %   Lymphs Abs 0.8 0.7 - 4.0 K/uL   Monocytes Relative 6 %   Monocytes Absolute 0.2 0 - 1 K/uL   Eosinophils Relative 0 %   Eosinophils Absolute 0.0 0 - 0 K/uL   Basophils Relative 0 %   Basophils Absolute 0.0 0 - 0 K/uL   Immature Granulocytes 4 %   Abs Immature Granulocytes 0.14 (H) 0.00 - 0.07 K/uL    Comment: Performed at [facility], [address] [place], [place] [address]  Comprehensive metabolic panel     Status: Abnormal   Collection Time: 05/01/20  4:38 AM  Result Value Ref Range   Sodium 140 135 - 145 mmol/L   Potassium 5.0 3.5 - 5.1 mmol/L   Chloride 104 98 - 111 mmol/L   CO2 30 22 - 32 mmol/L   Glucose, Bld 265 (H) 70 - 99 mg/dL    Comment: Glucose reference range applies only to samples taken after fasting for at least 8 hours.   BUN 29 (H) 6 - 20 mg/dL    Creatinine, Ser 1.00 0.44 - 1.00 mg/dL   Calcium 8.5 (L) 8.9 - 10.3 mg/dL   Total Protein 5.6 (L) 6.5 - 8.1 g/dL   Albumin 3.0 (L) 3.5 - 5.0 g/dL   AST 57 (H) 15 - 41 U/L   ALT 44 0 - 44 U/L   Alkaline Phosphatase 36 (L) 38 - 126 U/L   Total Bilirubin 0.9 0.3 - 1.2 mg/dL   GFR calc non Af Amer >60 >60 mL/min   GFR calc Af Amer >60 >60 mL/min   Anion gap 6 5 - 15    Comment: Performed at [facility], [address] [place], [place] [address]  C-reactive protein     Status: Abnormal   Collection Time: 05/01/20  4:38 AM  Result Value Ref Range   CRP 3.4 (H) <1.0 mg/dL    Comment: Performed at [place] [address] [place], [place] [address]  Fibrin derivatives D-Dimer [facility] only)     Status: Abnormal   Collection Time: 05/01/20  4:38 AM  Result Value Ref  Range   Fibrin derivatives D-dimer ([facility]) 563.28 (H) 0.00 - 499.00 ng/mL (FEU)    Comment: (NOTE) <> Exclusion of Venous Thromboembolism (VTE) - OUTPATIENT ONLY   (Emergency Department or [place])    0-499 ng/ml (FEU): With a low to intermediate pretest probability                      for VTE this test result excludes the diagnosis                      of VTE.   >499 ng/ml (FEU) : VTE not excluded; additional work up for VTE is                      required.  <> Testing on Inpatients and Evaluation of Disseminated Intravascular   Coagulation (DIC) Reference Range:   0-499 ng/ml (FEU) Performed at [facility], [address] [place], [place] [address]   Ferritin     Status: Abnormal   Collection Time: 05/01/20  4:38 AM  Result Value Ref Range   Ferritin 554 (H) 11 - 307 ng/mL    Comment: Performed at [facility], [address], [place], [place] [address]  Phosphorus     Status: None   Collection Time: 05/01/20  4:38 AM  Result Value Ref Range   Phosphorus 2.7 2.5 - 4.6 mg/dL    Comment: Performed at [facility], [address], [place], [place] [address]  Magnesium      Status: None   Collection Time: 05/01/20  4:38 AM  Result Value Ref Range   Magnesium 2.4 1.7 - 2.4 mg/dL    Comment: Performed at [facility], [address] [place], [place] [address]  Glucose, capillary     Status: Abnormal   Collection Time: 05/01/20  8:40 AM  Result Value Ref Range   Glucose-Capillary 227 (H) 70 - 99 mg/dL    Comment: Glucose reference range applies only to samples taken after fasting for at least 8 hours.  Glucose, capillary     Status: Abnormal   Collection Time: 05/01/20  2:16 PM  Result Value Ref Range   Glucose-Capillary 353 (H) 70 - 99 mg/dL    Comment: Glucose reference range applies only to samples taken after fasting for at least 8 hours.  Glucose, capillary     Status: Abnormal   Collection Time: 05/01/20  4:52 PM  Result Value Ref Range   Glucose-Capillary 295 (H) 70 - 99 mg/dL    Comment: Glucose reference range applies only to samples taken after fasting for at least 8 hours.  Glucose, capillary     Status: Abnormal   Collection Time: 05/01/20  9:30 PM  Result Value Ref Range   Glucose-Capillary 191 (H) 70 - 99 mg/dL    Comment: Glucose reference range applies only to samples taken after fasting for at least 8 hours.  CBC with Differential/Platelet     Status: Abnormal   Collection Time: 05/02/20  5:17 AM  Result Value Ref Range   WBC 4.0 4.0 - 10.5 K/uL   RBC 3.93 3.87 - 5.11 MIL/uL   Hemoglobin 11.3 (L) 12.0 - 15.0 g/dL   HCT 34.8 (L) 36 - 46 %   MCV 88.5 80.0 - 100.0 fL   MCH 28.8 26.0 - 34.0 pg   MCHC 32.5 30.0 - 36.0 g/dL   RDW 15.0 11.5 - 15.5 %   Platelets 194  150 - 400 K/uL   nRBC 0.0 0.0 - 0.2 %   Neutrophils Relative % 61 %   Neutro Abs 2.4 1.7 - 7.7 K/uL   Lymphocytes Relative 28 %   Lymphs Abs 1.1 0.7 - 4.0 K/uL   Monocytes Relative 7 %   Monocytes Absolute 0.3 0 - 1 K/uL   Eosinophils Relative 0 %   Eosinophils Absolute 0.0 0 - 0 K/uL   Basophils Relative 0 %   Basophils Absolute 0.0 0 - 0 K/uL   Immature  Granulocytes 4 %   Abs Immature Granulocytes 0.17 (H) 0.00 - 0.07 K/uL    Comment: Performed at [facility], [address], [place], [place] [address]  Comprehensive metabolic panel     Status: Abnormal   Collection Time: 05/02/20  5:17 AM  Result Value Ref Range   Sodium 139 135 - 145 mmol/L   Potassium 4.7 3.5 - 5.1 mmol/L   Chloride 99 98 - 111 mmol/L   CO2 29 22 - 32 mmol/L   Glucose, Bld 258 (H) 70 - 99 mg/dL    Comment: Glucose reference range applies only to samples taken after fasting for at least 8 hours.   BUN 27 (H) 6 - 20 mg/dL   Creatinine, Ser 1.01 (H) 0.44 - 1.00 mg/dL   Calcium 9.6 8.9 - 10.3 mg/dL   Total Protein 6.6 6.5 - 8.1 g/dL   Albumin 3.5 3.5 - 5.0 g/dL   AST 60 (H) 15 - 41 U/L   ALT 47 (H) 0 - 44 U/L   Alkaline Phosphatase 46 38 - 126 U/L   Total Bilirubin 0.9 0.3 - 1.2 mg/dL   GFR calc non Af Amer >60 >60 mL/min   GFR calc Af Amer >60 >60 mL/min   Anion gap 11 5 - 15    Comment: Performed at [facility], [address] [place], [place] [address]  C-reactive protein     Status: Abnormal   Collection Time: 05/02/20  5:17 AM  Result Value Ref Range   CRP 2.2 (H) <1.0 mg/dL    Comment: Performed at [place] [address] [place], [place] [address]  Fibrin derivatives D-Dimer [facility] only)     Status: Abnormal   Collection Time: 05/02/20  5:17 AM  Result Value Ref Range   Fibrin derivatives D-dimer ([facility]) 666.44 (H) 0.00 - 499.00 ng/mL (FEU)    Comment: (NOTE) <> Exclusion of Venous Thromboembolism (VTE) - OUTPATIENT ONLY   (Emergency Department or [place])    0-499 ng/ml (FEU): With a low to intermediate pretest probability                      for VTE this test result excludes the diagnosis                      of VTE.   >499 ng/ml (FEU) : VTE not excluded; additional work up for VTE is                      required.  <> Testing on Inpatients and Evaluation of Disseminated Intravascular   Coagulation (DIC) Reference  Range:   0-499 ng/ml (FEU) Performed at [facility], [address] [place], [place] [address]   Ferritin     Status: Abnormal   Collection Time: 05/02/20  5:17 AM  Result Value Ref Range   Ferritin 500 (H) 11 - 307 ng/mL  Comment: Performed at [facility], [address] [place], [place] [address]  Phosphorus     Status: None   Collection Time: 05/02/20  5:17 AM  Result Value Ref Range   Phosphorus 2.8 2.5 - 4.6 mg/dL    Comment: Performed at [facility], [address] [place], [place] [address]  Magnesium     Status: None   Collection Time: 05/02/20  5:17 AM  Result Value Ref Range   Magnesium 1.9 1.7 - 2.4 mg/dL    Comment: Performed at [facility], [address] [place], [place] [address]  Glucose, capillary     Status: Abnormal   Collection Time: 05/02/20  7:58 AM  Result Value Ref Range   Glucose-Capillary 293 (H) 70 - 99 mg/dL    Comment: Glucose reference range applies only to samples taken after fasting for at least 8 hours.   Comment 1 Notify RN   Glucose, capillary     Status: Abnormal   Collection Time: 05/02/20 12:24 PM  Result Value Ref Range   Glucose-Capillary 350 (H) 70 - 99 mg/dL    Comment: Glucose reference range applies only to samples taken after fasting for at least 8 hours.   Comment 1 Notify RN     PHQ2/9: Depression screen [facility] 2/9 01/26/2020 12/22/2019 11/05/2019 09/25/2019 08/12/2019  Decreased Interest 0 0 0 0 0  Down, Depressed, Hopeless 0 0 0 0 0  PHQ - 2 Score 0 0 0 0 0  Altered sleeping 0 0 0 - 0  Tired, decreased energy 0 0 2 - 1  Change in appetite 0 0 0 - 1  Feeling bad or failure about yourself  0 0 0 - 0  Trouble concentrating 0 0 0 - 0  Moving slowly or fidgety/restless 0 0 0 - 0  Suicidal thoughts 0 0 0 - 0  PHQ-9 Score 0 0 2 - 2  Difficult doing work/chores - - Somewhat difficult - Somewhat difficult  Some recent data might be hidden    phq 9 is negative   Fall Risk: Fall Risk   01/26/2020 12/22/2019 11/05/2019 09/25/2019 08/12/2019  Falls in the past year? 0 0 1 0 0  Comment - - - - -  Number falls in past yr: 0 0 1 0 0  Comment - - - - -  Injury with Fall? 0 0 0 0 0  Comment - - - - -  Risk for fall due to : - - - No Fall Risks -  Follow up - - - Falls prevention discussed Falls evaluation completed     Assessment & Plan    1. Hospital discharge follow-up  Needs to monitor pulse ox and vitals at home daily, she has not been doing that, can use saline spray, continue oxygen   2. Pneumonia due to COVID-19 virus  Reviewed notes from hospital stay still having sob with activity   3. Atherosclerosis of aorta ([place])  Discussed CT we will discuss in more detail during her in person visit   4. Type 2 diabetes, uncontrolled, with neuropathy ([place])  Needs to check glucose and write it down, may need to see endo if remains out of control

## 2020-05-04 ENCOUNTER — Encounter: Payer: Self-pay | Admitting: Family Medicine

## 2020-05-04 ENCOUNTER — Telehealth (INDEPENDENT_AMBULATORY_CARE_PROVIDER_SITE_OTHER): Payer: Medicare HMO | Admitting: Family Medicine

## 2020-05-04 DIAGNOSIS — IMO0002 Reserved for concepts with insufficient information to code with codable children: Secondary | ICD-10-CM

## 2020-05-04 DIAGNOSIS — I700 Atherosclerosis of aorta: Secondary | ICD-10-CM

## 2020-05-04 DIAGNOSIS — E1140 Type 2 diabetes mellitus with diabetic neuropathy, unspecified: Secondary | ICD-10-CM

## 2020-05-04 DIAGNOSIS — U071 COVID-19: Secondary | ICD-10-CM

## 2020-05-04 DIAGNOSIS — E1165 Type 2 diabetes mellitus with hyperglycemia: Secondary | ICD-10-CM

## 2020-05-04 DIAGNOSIS — J1282 Pneumonia due to coronavirus disease 2019: Secondary | ICD-10-CM

## 2020-05-04 DIAGNOSIS — Z09 Encounter for follow-up examination after completed treatment for conditions other than malignant neoplasm: Secondary | ICD-10-CM | POA: Diagnosis not present

## 2020-05-11 ENCOUNTER — Other Ambulatory Visit: Payer: Self-pay | Admitting: Family Medicine

## 2020-05-11 DIAGNOSIS — E038 Other specified hypothyroidism: Secondary | ICD-10-CM

## 2020-05-16 NOTE — ED Provider Notes (Signed)
[facility] Emergency Department Provider Note   ____________________________________________    I have reviewed the triage vital signs and the nursing notes.   HISTORY  Chief Complaint Weakness and Fall     HPI [name] is a 54 y.o. female with past medical history of CHF, CKD, COPD, CAD, diabetes who presents with complaints of weakness which has worsened over the last several days, she reports she did have a fall.  She does report shortness of breath, some chills, no loss of taste or smell.  Has not been vaccinated against COVID-19.  Denies chest pain.  Occasional cough.  Feels quite fatigued however.  Has not taken anything for this.  No nausea vomiting or abdominal pain.  Is reportedly on 2 L chronically  Past Medical History:  Diagnosis Date  . Anxiety   . Arthritis   . Asthma   . Bipolar depression ([place])   . CHF (congestive heart failure) ([place])   . Chronic headaches    migraines have lessened  . Chronic kidney disease    ddstage 111  . COPD (chronic obstructive pulmonary disease) ([place])   . Coronary artery disease   . Depression   . Diabetic neuropathy ([place])   . Dyspnea    uses inhalers for this  . Dysrhythmia    rapid  . Fatty liver   . GERD (gastroesophageal reflux disease)   . Headache   . Heart attack ([place]) 2009  . Hyperlipidemia   . Hypertension   . Hypomagnesemia   . Hypothyroid   . Irregular heart rhythm   . Liver lesion    Favored to be benign on CT; MRI of abd w/contract in Aug 2016. Liver Biopsy-Negative, March 2016  . Morbid obesity ([place])   . OSA (obstructive sleep apnea)   . Peripheral vascular disease ([place])   . Pinched nerve    Back  . Pneumonia   . RLS (restless legs syndrome)   . Seasonal allergies   . Sleep apnea    uses oxygen at night 2L d/t cpap did not fit  . Type 2 diabetes, uncontrolled, with neuropathy ([place]) 10/15/2015   Overview:  Microalbumin 7.7 12/2010: Hgb A1c 10.2% 12/2010 and 9.7% 03/2011,  eye exam 10/2009 [facility] DM clinic   . Urinary incontinence   . Vitamin D deficiency     Patient Active Problem List   Diagnosis Date Noted  . Iron deficiency anemia due to chronic blood loss   . Polyp of ascending colon   . Chronic gastric ulcer with hemorrhage   . Type 2 diabetes with decreased circulation ([place]) 11/07/2018  . Vitamin B12 deficiency 08/08/2018  . Encounter for Medicare annual wellness exam 10/30/2017  . Patient is full code 10/30/2017  . Disorder of bursae of shoulder region 10/09/2017  . Coronary artery disease 10/09/2017  . Plantar fascial fibromatosis 10/09/2017  . Tendinitis of wrist 10/09/2017  . Schizoaffective disorder, bipolar type ([place]) 09/28/2017  . Chest pain 09/10/2017  . 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 Breast cancer screening 09/28/2016  . Fatigue 05/01/2016  . Adenomatous colon polyp 10/15/2015  . Bipolar affective disorder ([place]) 10/15/2015  . CN (constipation) 10/15/2015  . Drug-induced hepatic toxicity 10/15/2015  . Blood in feces 10/15/2015  . HLD (hyperlipidemia) 10/15/2015  . Hypoxemia 10/15/2015  . NASH (nonalcoholic steatohepatitis) 10/15/2015  . Morbid obesity ([place]) 10/15/2015  . Restless leg syndrome 10/15/2015  . Type 2 diabetes, uncontrolled, with neuropathy ([place]) 10/15/2015  .  Elevated alkaline phosphatase level 09/22/2015  . Low back pain with left-sided sciatica 08/18/2015  . Encounter for monitoring tricyclic antidepressant therapy 05/13/2015  . Bipolar disorder ([place]) 05/11/2015  . Apnea, sleep 05/05/2015  . Heart valve disease 05/05/2015  . Chronic kidney disease, stage III (moderate) 03/15/2015  . Migraine 03/12/2015  . COPD (chronic obstructive pulmonary disease) ([place]) 01/20/2015  . 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 GERD (gastroesophageal reflux disease) 01/20/2015  . Hypothyroid   . Vitamin D deficiency   . Urinary incontinence   . Liver lesion   . Hypomagnesemia   . Obstructive  sleep apnea 10/20/2013  . Vaginitis 12/19/2011  . PERIPHERAL NEUROPATHY 03/04/2009  . HEMORRHOIDS, EXTERNAL 09/03/2008  . ASTHMA, PERSISTENT, MODERATE 07/04/2007  . INTERSTITIAL CYSTITIS 07/04/2007  . Allergic rhinitis 07/04/2007  . HYPERTENSION, BENIGN ESSENTIAL 06/19/2007    Past Surgical History:  Procedure Laterality Date  . ABDOMINAL HYSTERECTOMY     Partial  . bladder stim  2007  . BLADDER SURGERY     x 2. tacking of bladder and the other was the stimulator placement  . CHOLECYSTECTOMY    . COLONOSCOPY WITH PROPOFOL N/A 03/04/2019   Procedure: COLONOSCOPY WITH PROPOFOL;  Surgeon: [name], MD;  Location: [facility] ENDOSCOPY;  Service: Endoscopy;  Laterality: N/A;  . ESOPHAGOGASTRODUODENOSCOPY (EGD) WITH PROPOFOL N/A 03/04/2019   Procedure: ESOPHAGOGASTRODUODENOSCOPY (EGD) WITH PROPOFOL;  Surgeon: [name], MD;  Location: [facility] ENDOSCOPY;  Service: Endoscopy;  Laterality: N/A;  . INCISION AND DRAINAGE ABSCESS Right 04/16/2019   Procedure: INCISION AND DRAINAGE ABSCESS;  Surgeon: Benjamine [place], DO;  Location: [facility] ORS;  Service: General;  Laterality: Right;  . INTERSTIM-GENERATOR CHANGE N/A 12/31/2017   Procedure: INTERSTIM-GENERATOR CHANGE;  Surgeon: Bjorn Loser, MD;  Location: [facility] ORS;  Service: Urology;  Laterality: N/A;  . LIVER BIOPSY  March 2016   Negative  . LUNG BIOPSY  2015   small spot on lung  . TUBAL LIGATION    . UPPER GI ENDOSCOPY    . WRIST SURGERY Right 11/02/2019    Prior to Admission medications   Medication Sig Start Date End Date Taking? Authorizing Provider  albuterol (VENTOLIN HFA) 108 (90 Base) MCG/ACT inhaler Inhale 2 puffs into the lungs every 6 (six) hours as needed for wheezing or shortness of breath. 08/12/19  Yes Hubbard Hartshorn, FNP  atorvastatin (LIPITOR) 80 MG tablet Take 80 mg by mouth at bedtime.    Yes [provider]  busPIRone (BUSPAR) 10 MG tablet Take 20 mg by mouth 2 (two) times daily.    Yes [provider]    citalopram (CELEXA) 20 MG tablet Take 20 mg by mouth daily. 01/16/20  Yes [provider]  empagliflozin (JARDIANCE) 25 MG TABS tablet Take 25 mg by mouth daily.   Yes [provider]  ezetimibe (ZETIA) 10 MG tablet Take 10 mg by mouth daily.  01/05/17  Yes [provider]  fenofibrate micronized (LOFIBRA) 134 MG capsule Take 134 mg by mouth daily.   Yes [provider]  fluticasone (FLONASE) 50 MCG/ACT nasal spray Place 2 sprays into both nostrils daily. 01/26/20  Yes Sowles, Drue Stager, MD  gabapentin (NEURONTIN) 300 MG capsule Take 1-2 capsules (300-600 mg total) by mouth 3 (three) times daily. Take 1 capsule each morning, 1 capsule each afternoon, and 2 capsules at bedtime. 03/17/20  Yes Sowles, Drue Stager, MD  hydrOXYzine (VISTARIL) 50 MG capsule Take 50 mg by mouth 3 (three) times daily as needed for Itching 04/09/20  Yes  [provider]  insulin aspart (NOVOLOG FLEXPEN) 100 UNIT/ML FlexPen Inject 3 Units subcutaneously. Sliding scale.   Yes [provider]  insulin regular human CONCENTRATED (HUMULIN R) 500 UNIT/ML injection Inject 70 Units into the skin 3 (three) times daily with meals.   Yes [provider]  lamoTRIgine (LAMICTAL) 200 MG tablet Take 200 mg by mouth 2 (two) times daily.    Yes [provider]  lisinopril (ZESTRIL) 2.5 MG tablet TAKE 1 TABLET BY MOUTH EVERY DAY 01/06/20  Yes Sowles, Drue Stager, MD  Omega-3 Fatty Acids (FISH OIL PO) Take by mouth daily.   Yes [provider]  ondansetron (ZOFRAN) 4 MG tablet TAKE 1 TABLET BY MOUTH EVERY 8 HOURS AS NEEDED FOR NAUSEA OR VOMITING Patient taking differently: Take 4 mg by mouth every 8 (eight) hours as needed for nausea or vomiting.  09/15/19  Yes [name], MD  pantoprazole (PROTONIX) 40 MG tablet Take 1 tablet (40 mg total) by mouth daily. 08/20/19  Yes [name], MD  polyethylene glycol (MIRALAX) 17 g packet Take 17 g by mouth daily. Patient taking differently:  Take 17 g by mouth daily as needed for mild constipation.  10/27/19  Yes Nance Pear, MD  QUEtiapine (SEROQUEL) 200 MG tablet Take 200 mg by mouth at bedtime.   Yes [provider]  traZODone (DESYREL) 100 MG tablet Take 100 mg by mouth at bedtime.   Yes [provider]  dexamethasone (DECADRON) 2 MG tablet Take 2 mg by mouth 3 (three) times daily. 05/03/20   [provider]  dextromethorphan-guaiFENesin (MUCINEX DM) 30-600 MG 12hr tablet Take 1 tablet by mouth 2 (two) times daily. 05/02/20   Lavina Hamman, MD  levothyroxine (SYNTHROID) 50 MCG tablet TAKE ONE TABLET DAILY 3 DAYS A WEEK, THEN TAKE ONE AND ONE-HALF TABLETS FOUR DAYS A WEEK 05/11/20   Ancil Boozer, Drue Stager, MD  vitamin C (ASCORBIC ACID) 250 MG tablet Take 1 tablet (250 mg total) by mouth daily. 05/02/20   Lavina Hamman, MD  Zinc 220 (50 Zn) MG CAPS Take 1 tablet by mouth daily. 05/02/20   Lavina Hamman, MD     Allergies Mirtazapine, Morphine and related, Sulfamethoxazole-trimethoprim, Amoxicillin er, Codeine, Hydrocodone-acetaminophen, Keflex [cephalexin], and Prasterone  Family History  Problem Relation Age of Onset  . Diabetes Mother   . Heart disease Mother   . Hyperlipidemia Mother   . Hypertension Mother   . Kidney disease Mother   . Mental illness Mother   . Hypothyroidism Mother   . Stroke Mother   . Osteoporosis Mother   . Glaucoma Mother   . Congestive Heart Failure Mother   . Hypertension Father   . Asthma Daughter   . Cancer Daughter        throat  . Bipolar disorder Daughter   . Heart disease Maternal Grandfather   . Rheum arthritis Maternal Grandfather   . Cancer Maternal Grandfather        liver  . Kidney disease Maternal Grandfather   . Cancer Paternal Grandmother        lung  . Kidney disease Brother   . Breast cancer Maternal Grandmother 60  . Bipolar disorder Daughter   . Hypertension Daughter   . Restless legs syndrome Daughter     Social History Social History    Tobacco Use  . Smoking status: Never Smoker  . Smokeless tobacco: Never Used  Vaping Use  . Vaping Use: Never used  Substance Use Topics  . Alcohol  use: No  . Drug use: No    Review of Systems  Constitutional: As above Eyes: No visual changes.  ENT: No sore throat. Cardiovascular: Denies chest pain. Respiratory: As above Gastrointestinal: No abdominal pain.  No nausea, no vomiting.   Genitourinary: Negative for dysuria. Musculoskeletal: Negative for back pain. Skin: Negative for rash. Neurological: Negative for headaches    ____________________________________________   PHYSICAL EXAM:  VITAL SIGNS: ED Triage Vitals [04/28/20 0929]  Enc Vitals Group     BP (!) 102/57     Pulse Rate 83     Resp 18     Temp 99.6 F (37.6 C)     Temp Source Oral     SpO2 94 %     Weight 86 kg (189 lb 9.5 oz)     Height 1.499 m (4' 11" )     Head Circumference      Peak Flow      Pain Score 0     Pain Loc      Pain Edu?      Excl. in [place]?     Constitutional: Alert and oriented. No acute distress. Eyes: Conjunctivae are normal.  Head: Atraumatic. Nose: No congestion/rhinnorhea. Mouth/Throat: Mucous membranes are moist.   Neck:  Painless ROM, no vertebral tenderness palpation Cardiovascular: Normal rate, regular rhythm. Grossly normal heart sounds.  Good peripheral circulation. Respiratory: Normal respiratory effort.  No retractions.  No significant wheezing or rales Gastrointestinal: Soft and nontender. No distention.    Musculoskeletal: No lower extremity tenderness nor edema.  Warm and well perfused Neurologic:  Normal speech and language. No gross focal neurologic deficits are appreciated.  Skin:  Skin is warm, dry and intact. No rash noted. Psychiatric: Mood and affect are normal. Speech and behavior are normal.  ____________________________________________   LABS (all labs ordered are listed, but only abnormal results are displayed)  Labs Reviewed  SARS  CORONAVIRUS 2 BY RT PCR (HOSPITAL ORDER, [facility]) - Abnormal; Notable for the following components:      Result Value   SARS Coronavirus 2 POSITIVE (*)    All other components within normal limits  BASIC METABOLIC PANEL - Abnormal; Notable for the following components:   Glucose, Bld 247 (*)    BUN 35 (*)    Creatinine, Ser 1.82 (*)    Calcium 8.8 (*)    GFR calc non Af Amer 31 (*)    GFR calc Af Amer 35 (*)    All other components within normal limits  CBC - Abnormal; Notable for the following components:   WBC 3.3 (*)    RBC 3.81 (*)    Hemoglobin 11.0 (*)    HCT 35.1 (*)    All other components within normal limits  URINALYSIS, COMPLETE (UACMP) WITH MICROSCOPIC - Abnormal; Notable for the following components:   Color, Urine YELLOW (*)    APPearance HAZY (*)    Glucose, UA >=500 (*)    Hgb urine dipstick SMALL (*)    Ketones, ur 20 (*)    All other components within normal limits  FIBRIN DERIVATIVES D-DIMER ([facility] ONLY) - Abnormal; Notable for the following components:   Fibrin derivatives D-dimer ([facility]) 653.87 (*)    All other components within normal limits  CBC WITH DIFFERENTIAL/PLATELET - Abnormal; Notable for the following components:   WBC 3.4 (*)    RBC 3.76 (*)    Hemoglobin 11.0 (*)    HCT 35.3 (*)    All other  components within normal limits  GLUCOSE, CAPILLARY - Abnormal; Notable for the following components:   Glucose-Capillary 273 (*)    All other components within normal limits  CBC WITH DIFFERENTIAL/PLATELET - Abnormal; Notable for the following components:   WBC 2.9 (*)    RBC 3.39 (*)    Hemoglobin 9.8 (*)    HCT 31.8 (*)    Platelets 137 (*)    Neutro Abs 1.5 (*)    All other components within normal limits  COMPREHENSIVE METABOLIC PANEL - Abnormal; Notable for the following components:   CO2 18 (*)    Glucose, Bld 290 (*)    BUN 48 (*)    Creatinine, Ser 1.80 (*)    Calcium 7.7 (*)    Total Protein 5.8 (*)     Albumin 3.2 (*)    AST 98 (*)    ALT 58 (*)    Total Bilirubin 2.2 (*)    GFR calc non Af Amer 31 (*)    GFR calc Af Amer 36 (*)    Anion gap 16 (*)    All other components within normal limits  C-REACTIVE PROTEIN - Abnormal; Notable for the following components:   CRP 4.5 (*)    All other components within normal limits  FIBRIN DERIVATIVES D-DIMER ([facility] ONLY) - Abnormal; Notable for the following components:   Fibrin derivatives D-dimer ([facility]) 608.84 (*)    All other components within normal limits  FERRITIN - Abnormal; Notable for the following components:   Ferritin 654 (*)    All other components within normal limits  PHOSPHORUS - Abnormal; Notable for the following components:   Phosphorus 6.6 (*)    All other components within normal limits  MAGNESIUM - Abnormal; Notable for the following components:   Magnesium 2.6 (*)    All other components within normal limits  GLUCOSE, CAPILLARY - Abnormal; Notable for the following components:   Glucose-Capillary 324 (*)    All other components within normal limits  CBC WITH DIFFERENTIAL/PLATELET - Abnormal; Notable for the following components:   WBC 3.1 (*)    RBC 3.29 (*)    Hemoglobin 9.6 (*)    HCT 29.4 (*)    All other components within normal limits  COMPREHENSIVE METABOLIC PANEL - Abnormal; Notable for the following components:   Glucose, Bld 242 (*)    BUN 38 (*)    Creatinine, Ser 1.41 (*)    Calcium 8.1 (*)    Total Protein 5.4 (*)    Albumin 3.0 (*)    AST 71 (*)    ALT 50 (*)    Alkaline Phosphatase 37 (*)    Total Bilirubin 1.5 (*)    GFR calc non Af Amer 42 (*)    GFR calc Af Amer 48 (*)    All other components within normal limits  C-REACTIVE PROTEIN - Abnormal; Notable for the following components:   CRP 3.9 (*)    All other components within normal limits  FIBRIN DERIVATIVES D-DIMER ([facility] ONLY) - Abnormal; Notable for the following components:   Fibrin derivatives D-dimer ([facility]) 553.24 (*)    All other  components within normal limits  FERRITIN - Abnormal; Notable for the following components:   Ferritin 663 (*)    All other components within normal limits  MAGNESIUM - Abnormal; Notable for the following components:   Magnesium 2.8 (*)    All other components within normal limits  GLUCOSE, CAPILLARY - Abnormal; Notable for the following components:  Glucose-Capillary 260 (*)    All other components within normal limits  GLUCOSE, CAPILLARY - Abnormal; Notable for the following components:   Glucose-Capillary 178 (*)    All other components within normal limits  GLUCOSE, CAPILLARY - Abnormal; Notable for the following components:   Glucose-Capillary 209 (*)    All other components within normal limits  GLUCOSE, CAPILLARY - Abnormal; Notable for the following components:   Glucose-Capillary 290 (*)    All other components within normal limits  GLUCOSE, CAPILLARY - Abnormal; Notable for the following components:   Glucose-Capillary 230 (*)    All other components within normal limits  CBC WITH DIFFERENTIAL/PLATELET - Abnormal; Notable for the following components:   WBC 3.4 (*)    RBC 3.46 (*)    Hemoglobin 10.1 (*)    HCT 30.9 (*)    Abs Immature Granulocytes 0.14 (*)    All other components within normal limits  COMPREHENSIVE METABOLIC PANEL - Abnormal; Notable for the following components:   Glucose, Bld 265 (*)    BUN 29 (*)    Calcium 8.5 (*)    Total Protein 5.6 (*)    Albumin 3.0 (*)    AST 57 (*)    Alkaline Phosphatase 36 (*)    All other components within normal limits  C-REACTIVE PROTEIN - Abnormal; Notable for the following components:   CRP 3.4 (*)    All other components within normal limits  FIBRIN DERIVATIVES D-DIMER ([facility] ONLY) - Abnormal; Notable for the following components:   Fibrin derivatives D-dimer ([facility]) 563.28 (*)    All other components within normal limits  FERRITIN - Abnormal; Notable for the following components:   Ferritin 554 (*)    All  other components within normal limits  GLUCOSE, CAPILLARY - Abnormal; Notable for the following components:   Glucose-Capillary 238 (*)    All other components within normal limits  GLUCOSE, CAPILLARY - Abnormal; Notable for the following components:   Glucose-Capillary 227 (*)    All other components within normal limits  GLUCOSE, CAPILLARY - Abnormal; Notable for the following components:   Glucose-Capillary 353 (*)    All other components within normal limits  GLUCOSE, CAPILLARY - Abnormal; Notable for the following components:   Glucose-Capillary 295 (*)    All other components within normal limits  CBC WITH DIFFERENTIAL/PLATELET - Abnormal; Notable for the following components:   Hemoglobin 11.3 (*)    HCT 34.8 (*)    Abs Immature Granulocytes 0.17 (*)    All other components within normal limits  COMPREHENSIVE METABOLIC PANEL - Abnormal; Notable for the following components:   Glucose, Bld 258 (*)    BUN 27 (*)    Creatinine, Ser 1.01 (*)    AST 60 (*)    ALT 47 (*)    All other components within normal limits  C-REACTIVE PROTEIN - Abnormal; Notable for the following components:   CRP 2.2 (*)    All other components within normal limits  FIBRIN DERIVATIVES D-DIMER ([facility] ONLY) - Abnormal; Notable for the following components:   Fibrin derivatives D-dimer ([facility]) 666.44 (*)    All other components within normal limits  FERRITIN - Abnormal; Notable for the following components:   Ferritin 500 (*)    All other components within normal limits  GLUCOSE, CAPILLARY - Abnormal; Notable for the following components:   Glucose-Capillary 191 (*)    All other components within normal limits  GLUCOSE, CAPILLARY - Abnormal; Notable for the following components:  Glucose-Capillary 293 (*)    All other components within normal limits  GLUCOSE, CAPILLARY - Abnormal; Notable for the following components:   Glucose-Capillary 350 (*)    All other components within normal limits  LACTIC  ACID, PLASMA  FIBRINOGEN  PROCALCITONIN  PHOSPHORUS  PHOSPHORUS  MAGNESIUM  PHOSPHORUS  MAGNESIUM   ____________________________________________  EKG  ED ECG REPORT I, Lavonia Drafts, the attending physician, personally viewed and interpreted this ECG.  Date: 04/28/2020  Rhythm: normal sinus rhythm QRS Axis: normal Intervals: normal ST/T Wave abnormalities: normal Narrative Interpretation: no evidence of acute ischemia  ____________________________________________  RADIOLOGY  Chest x-ray suspicious for multifocal pneumonia ____________________________________________   PROCEDURES  Procedure(s) performed: No  Procedures   Critical Care performed: no    ____________________________________________   INITIAL IMPRESSION / ASSESSMENT AND PLAN / ED COURSE  Pertinent labs & imaging results that were available during my care of the patient were reviewed by me and considered in my medical decision making (see chart for details).  Patient presents with mildly increased shortness of breath in the setting of COPD, CHF on 2 L nasal cannula.  Also reports significant fatigue with a fall.  Denies chest pain to me.  Afebrile, has not taken anything for this  Strongly suspicious for COVID-19, versus CAP, versus UTI.  No indication of sepsis.  Chest x-ray suspicious for multifocal pneumonia, Covid test, labs pending.  Have asked Dr. Ellender Hose to follow-up and dispo patient appropriately.     ____________________________________________   FINAL CLINICAL IMPRESSION(S) / ED DIAGNOSES  Final diagnoses:  Acute hypoxemic respiratory failure due to COVID-19 [facility])        Note:  This document was prepared using Dragon voice recognition software and may include unintentional dictation errors.   Lavonia Drafts, MD 05/16/20 [phone]

## 2020-05-17 ENCOUNTER — Telehealth: Payer: Self-pay

## 2020-05-17 NOTE — Telephone Encounter (Signed)
Copied from [place] [id number]. Topic: Quick Communication - Home Health Verbal Orders >> May 14, 2020  4:02 PM Gillis Ends D wrote: Caller/Agency: [facility] Callback Number: [phone] Requesting OT/PT/Skilled Nursing/Social Work/Speech Therapy: PT Frequency:twice a week for 1 week and then 1 time a week for 1 week and then every other week for 2 weeks

## 2020-05-17 NOTE — Telephone Encounter (Signed)
Called [facility]. LVM ok'd verbal orders for PT.

## 2020-05-18 ENCOUNTER — Emergency Department (HOSPITAL_COMMUNITY): Payer: Medicare HMO

## 2020-05-18 ENCOUNTER — Encounter (HOSPITAL_COMMUNITY): Payer: Self-pay | Admitting: *Deleted

## 2020-05-18 ENCOUNTER — Emergency Department (HOSPITAL_COMMUNITY)
Admission: EM | Admit: 2020-05-18 | Discharge: 2020-05-18 | Disposition: A | Discharge status: Home / Self Care | Payer: Medicare HMO | Attending: Emergency Medicine | Admitting: Emergency Medicine

## 2020-05-18 ENCOUNTER — Other Ambulatory Visit: Payer: Self-pay

## 2020-05-18 DIAGNOSIS — Z79899 Other long term (current) drug therapy: Secondary | ICD-10-CM | POA: Insufficient documentation

## 2020-05-18 DIAGNOSIS — E039 Hypothyroidism, unspecified: Secondary | ICD-10-CM | POA: Diagnosis not present

## 2020-05-18 DIAGNOSIS — I2510 Atherosclerotic heart disease of native coronary artery without angina pectoris: Secondary | ICD-10-CM | POA: Diagnosis not present

## 2020-05-18 DIAGNOSIS — I130 Hypertensive heart and chronic kidney disease with heart failure and stage 1 through stage 4 chronic kidney disease, or unspecified chronic kidney disease: Secondary | ICD-10-CM | POA: Diagnosis not present

## 2020-05-18 DIAGNOSIS — I509 Heart failure, unspecified: Secondary | ICD-10-CM | POA: Diagnosis not present

## 2020-05-18 DIAGNOSIS — E1122 Type 2 diabetes mellitus with diabetic chronic kidney disease: Secondary | ICD-10-CM | POA: Diagnosis not present

## 2020-05-18 DIAGNOSIS — E860 Dehydration: Secondary | ICD-10-CM | POA: Insufficient documentation

## 2020-05-18 DIAGNOSIS — R410 Disorientation, unspecified: Secondary | ICD-10-CM | POA: Diagnosis not present

## 2020-05-18 DIAGNOSIS — J449 Chronic obstructive pulmonary disease, unspecified: Secondary | ICD-10-CM | POA: Insufficient documentation

## 2020-05-18 DIAGNOSIS — E1140 Type 2 diabetes mellitus with diabetic neuropathy, unspecified: Secondary | ICD-10-CM | POA: Diagnosis not present

## 2020-05-18 DIAGNOSIS — Z794 Long term (current) use of insulin: Secondary | ICD-10-CM | POA: Insufficient documentation

## 2020-05-18 DIAGNOSIS — R42 Dizziness and giddiness: Secondary | ICD-10-CM | POA: Diagnosis present

## 2020-05-18 DIAGNOSIS — N183 Chronic kidney disease, stage 3 unspecified: Secondary | ICD-10-CM | POA: Diagnosis not present

## 2020-05-18 LAB — DIFFERENTIAL
Abs Immature Granulocytes: 0.06 10*3/uL (ref 0.00–0.07)
Basophils Absolute: 0 10*3/uL (ref 0.0–0.1)
Basophils Relative: 0 %
Eosinophils Absolute: 0.1 10*3/uL (ref 0.0–0.5)
Eosinophils Relative: 1 %
Immature Granulocytes: 1 %
Lymphocytes Relative: 27 %
Lymphs Abs: 1.6 10*3/uL (ref 0.7–4.0)
Monocytes Absolute: 0.8 10*3/uL (ref 0.1–1.0)
Monocytes Relative: 13 %
Neutro Abs: 3.4 10*3/uL (ref 1.7–7.7)
Neutrophils Relative %: 58 %

## 2020-05-18 LAB — I-STAT CHEM 8, ED
BUN: 71 mg/dL — ABNORMAL HIGH (ref 6–20)
Calcium, Ion: 1.12 mmol/L — ABNORMAL LOW (ref 1.15–1.40)
Chloride: 106 mmol/L (ref 98–111)
Creatinine, Ser: 1.8 mg/dL — ABNORMAL HIGH (ref 0.44–1.00)
Glucose, Bld: 156 mg/dL — ABNORMAL HIGH (ref 70–99)
HCT: 31 % — ABNORMAL LOW (ref 36.0–46.0)
Hemoglobin: 10.5 g/dL — ABNORMAL LOW (ref 12.0–15.0)
Potassium: 4.7 mmol/L (ref 3.5–5.1)
Sodium: 140 mmol/L (ref 135–145)
TCO2: 25 mmol/L (ref 22–32)

## 2020-05-18 LAB — CBC
HCT: 34.5 % — ABNORMAL LOW (ref 36.0–46.0)
Hemoglobin: 10.3 g/dL — ABNORMAL LOW (ref 12.0–15.0)
MCH: 27.8 pg (ref 26.0–34.0)
MCHC: 29.9 g/dL — ABNORMAL LOW (ref 30.0–36.0)
MCV: 93 fL (ref 80.0–100.0)
Platelets: 203 10*3/uL (ref 150–400)
RBC: 3.71 MIL/uL — ABNORMAL LOW (ref 3.87–5.11)
RDW: 15.6 % — ABNORMAL HIGH (ref 11.5–15.5)
WBC: 5.9 10*3/uL (ref 4.0–10.5)
nRBC: 0 % (ref 0.0–0.2)

## 2020-05-18 LAB — COMPREHENSIVE METABOLIC PANEL
ALT: 67 U/L — ABNORMAL HIGH (ref 0–44)
AST: 62 U/L — ABNORMAL HIGH (ref 15–41)
Albumin: 3.2 g/dL — ABNORMAL LOW (ref 3.5–5.0)
Alkaline Phosphatase: 46 U/L (ref 38–126)
Anion gap: 11 (ref 5–15)
BUN: 54 mg/dL — ABNORMAL HIGH (ref 6–20)
CO2: 23 mmol/L (ref 22–32)
Calcium: 9.1 mg/dL (ref 8.9–10.3)
Chloride: 105 mmol/L (ref 98–111)
Creatinine, Ser: 1.71 mg/dL — ABNORMAL HIGH (ref 0.44–1.00)
GFR calc Af Amer: 38 mL/min — ABNORMAL LOW (ref >60)
GFR calc non Af Amer: 33 mL/min — ABNORMAL LOW (ref >60)
Glucose, Bld: 157 mg/dL — ABNORMAL HIGH (ref 70–99)
Potassium: 3.9 mmol/L (ref 3.5–5.1)
Sodium: 139 mmol/L (ref 135–145)
Total Bilirubin: 0.5 mg/dL (ref 0.3–1.2)
Total Protein: 6.1 g/dL — ABNORMAL LOW (ref 6.5–8.1)

## 2020-05-18 LAB — I-STAT BETA HCG BLOOD, ED (MC, WL, AP ONLY): I-stat hCG, quantitative: <5 m[IU]/mL (ref <5)

## 2020-05-18 LAB — CBG MONITORING, ED
Glucose-Capillary: 68 mg/dL — ABNORMAL LOW (ref 70–99)
Glucose-Capillary: 60 mg/dL — ABNORMAL LOW (ref 70–99)
Glucose-Capillary: 68 mg/dL — ABNORMAL LOW (ref 70–99)
Glucose-Capillary: 93 mg/dL (ref 70–99)

## 2020-05-18 LAB — PROTIME-INR
INR: 1 (ref 0.8–1.2)
Prothrombin Time: 13 seconds (ref 11.4–15.2)

## 2020-05-18 LAB — APTT: aPTT: 24 seconds (ref 24–36)

## 2020-05-18 MED ORDER — SODIUM CHLORIDE 0.9% FLUSH
3.0000 mL | Freq: Once | INTRAVENOUS | Status: DC
Start: 2020-05-18 — End: 2020-05-18

## 2020-05-18 MED ORDER — LACTATED RINGERS IV BOLUS
1000.0000 mL | Freq: Once | INTRAVENOUS | Status: AC
  Administered 2020-05-18: 1000 mL via INTRAVENOUS

## 2020-05-18 NOTE — Discharge Instructions (Signed)
Eat and drink as well as you can for the next 48 hours.  Please call your family doctor and see if they can see you in the office.  If not there is a post Covid clinic that is running [place] that you can try and go to.  Ideally I would like to have your lab work rechecked in about a week to reassess your renal function.  Please return for worsening symptoms or if you are unable to walk.

## 2020-05-18 NOTE — ED Notes (Signed)
CBG 68. 4 oz of orange juice and graham crackers given. Dr. Tyrone Nine notified.

## 2020-05-18 NOTE — ED Notes (Signed)
Voicemail left to husband, [name] to come pick up patient.

## 2020-05-18 NOTE — ED Notes (Signed)
Patient verbalizes understanding of discharge instructions. Opportunity for questioning and answers were provided. Arm band removed by staff, patient discharged from ED. 

## 2020-05-18 NOTE — ED Triage Notes (Addendum)
Pt arrived by [facility]. Reported a fall at 0300 but fell due to dizziness. States that she had onset of slurred speech and dizziness 8pm last night. Reports blurred vision x 2 days and is lethargic at triage. Was recently covid + with hospital admission on 8/25.

## 2020-05-18 NOTE — ED Provider Notes (Signed)
[facility] EMERGENCY DEPARTMENT Provider Note   CSN: [id number] Arrival date & time: 05/18/20  2458     History Chief Complaint  Patient presents with  . Dizziness  . Fall    [name] is a 54 y.o. female.  54 yo F with a chief complaints of dizziness.  This is been ongoing since she was diagnosed with the coronavirus which was a couple weeks ago.  At that time she had had cough fever and shortness of breath.  Stayed in the hospital for 4 days and was discharged home.  She has been having episodes when she is up and walking where she bounces into the walls and she is also been slurring her speech off and on.  She denies persistent cough or trouble breathing.  She is on 2 L of oxygen at night required for her with the coronavirus but now has been able to go back to her normal home oxygen.  She denies chest pain or pressure.  Denies headache or neck pain.  She has been slurring her speech off and on.  Has been eating and drinking normally per her.  No nausea vomiting or diarrhea.  The history is provided by the patient.  Dizziness Associated symptoms: no chest pain, no headaches, no nausea, no palpitations, no shortness of breath and no vomiting   Fall Pertinent negatives include no chest pain, no headaches and no shortness of breath.  Illness Severity:  Moderate Onset quality:  Gradual Duration:  2 weeks Timing:  Intermittent Progression:  Waxing and waning Chronicity:  New Associated symptoms: no chest pain, no congestion, no fever, no headaches, no myalgias, no nausea, no rhinorrhea, no shortness of breath, no vomiting and no wheezing        Past Medical History:  Diagnosis Date  . Anxiety   . Arthritis   . Asthma   . Bipolar depression ([place])   . CHF (congestive heart failure) ([place])   . Chronic headaches    migraines have lessened  . Chronic kidney disease    ddstage 111  . COPD (chronic obstructive pulmonary disease) ([place])   . Coronary artery  disease   . Depression   . Diabetic neuropathy ([place])   . Dyspnea    uses inhalers for this  . Dysrhythmia    rapid  . Fatty liver   . GERD (gastroesophageal reflux disease)   . Headache   . Heart attack ([place]) 2009  . Hyperlipidemia   . Hypertension   . Hypomagnesemia   . Hypothyroid   . Irregular heart rhythm   . Liver lesion    Favored to be benign on CT; MRI of abd w/contract in Aug 2016. Liver Biopsy-Negative, March 2016  . Morbid obesity ([place])   . OSA (obstructive sleep apnea)   . Peripheral vascular disease ([place])   . Pinched nerve    Back  . Pneumonia   . RLS (restless legs syndrome)   . Seasonal allergies   . Sleep apnea    uses oxygen at night 2L d/t cpap did not fit  . Type 2 diabetes, uncontrolled, with neuropathy ([place]) 10/15/2015   Overview:  Microalbumin 7.7 12/2010: Hgb A1c 10.2% 12/2010 and 9.7% 03/2011, eye exam 10/2009 [facility] DM clinic   . Urinary incontinence   . Vitamin D deficiency     Patient Active Problem List   Diagnosis Date Noted  . Iron deficiency anemia due to chronic blood loss   . Polyp of ascending colon   .  Chronic gastric ulcer with hemorrhage   . Type 2 diabetes with decreased circulation ([place]) 11/07/2018  . Vitamin B12 deficiency 08/08/2018  . Encounter for Medicare annual wellness exam 10/30/2017  . Patient is full code 10/30/2017  . Disorder of bursae of shoulder region 10/09/2017  . Coronary artery disease 10/09/2017  . Plantar fascial fibromatosis 10/09/2017  . Tendinitis of wrist 10/09/2017  . Schizoaffective disorder, bipolar type ([place]) 09/28/2017  . Chest pain 09/10/2017  . 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 Breast cancer screening 09/28/2016  . Fatigue 05/01/2016  . Adenomatous colon polyp 10/15/2015  . Bipolar affective disorder ([place]) 10/15/2015  . CN (constipation) 10/15/2015  . Drug-induced hepatic toxicity 10/15/2015  . Blood in feces 10/15/2015  . HLD (hyperlipidemia) 10/15/2015  . Hypoxemia  10/15/2015  . NASH (nonalcoholic steatohepatitis) 10/15/2015  . Morbid obesity ([place]) 10/15/2015  . Restless leg syndrome 10/15/2015  . Type 2 diabetes, uncontrolled, with neuropathy ([place]) 10/15/2015  . Elevated alkaline phosphatase level 09/22/2015  . Low back pain with left-sided sciatica 08/18/2015  . Encounter for monitoring tricyclic antidepressant therapy 05/13/2015  . Bipolar disorder ([place]) 05/11/2015  . Apnea, sleep 05/05/2015  . Heart valve disease 05/05/2015  . Chronic kidney disease, stage III (moderate) 03/15/2015  . Migraine 03/12/2015  . COPD (chronic obstructive pulmonary disease) ([place]) 01/20/2015  . 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 GERD (gastroesophageal reflux disease) 01/20/2015  . Hypothyroid   . Vitamin D deficiency   . Urinary incontinence   . Liver lesion   . Hypomagnesemia   . Obstructive sleep apnea 10/20/2013  . Vaginitis 12/19/2011  . PERIPHERAL NEUROPATHY 03/04/2009  . HEMORRHOIDS, EXTERNAL 09/03/2008  . ASTHMA, PERSISTENT, MODERATE 07/04/2007  . INTERSTITIAL CYSTITIS 07/04/2007  . Allergic rhinitis 07/04/2007  . HYPERTENSION, BENIGN ESSENTIAL 06/19/2007    Past Surgical History:  Procedure Laterality Date  . ABDOMINAL HYSTERECTOMY     Partial  . bladder stim  2007  . BLADDER SURGERY     x 2. tacking of bladder and the other was the stimulator placement  . CHOLECYSTECTOMY    . COLONOSCOPY WITH PROPOFOL N/A 03/04/2019   Procedure: COLONOSCOPY WITH PROPOFOL;  Surgeon: [name], MD;  Location: [facility] ENDOSCOPY;  Service: Endoscopy;  Laterality: N/A;  . ESOPHAGOGASTRODUODENOSCOPY (EGD) WITH PROPOFOL N/A 03/04/2019   Procedure: ESOPHAGOGASTRODUODENOSCOPY (EGD) WITH PROPOFOL;  Surgeon: [name], MD;  Location: [facility] ENDOSCOPY;  Service: Endoscopy;  Laterality: N/A;  . INCISION AND DRAINAGE ABSCESS Right 04/16/2019   Procedure: INCISION AND DRAINAGE ABSCESS;  Surgeon: Benjamine Sprague, DO;  Location: [facility] ORS;  Service: General;  Laterality:  Right;  . INTERSTIM-GENERATOR CHANGE N/A 12/31/2017   Procedure: INTERSTIM-GENERATOR CHANGE;  Surgeon: Bjorn Loser, MD;  Location: [facility] ORS;  Service: Urology;  Laterality: N/A;  . LIVER BIOPSY  March 2016   Negative  . LUNG BIOPSY  2015   small spot on lung  . TUBAL LIGATION    . UPPER GI ENDOSCOPY    . WRIST SURGERY Right 11/02/2019     OB History    Gravida  0   Para  0   Term  0   Preterm  0   AB  0   Living        SAB  0   TAB  0   Ectopic  0   Multiple      Live Births              Family History  Problem  Relation Age of Onset  . Diabetes Mother   . Heart disease Mother   . Hyperlipidemia Mother   . Hypertension Mother   . Kidney disease Mother   . Mental illness Mother   . Hypothyroidism Mother   . Stroke Mother   . Osteoporosis Mother   . Glaucoma Mother   . Congestive Heart Failure Mother   . Hypertension Father   . Asthma Daughter   . Cancer Daughter        throat  . Bipolar disorder Daughter   . Heart disease Maternal Grandfather   . Rheum arthritis Maternal Grandfather   . Cancer Maternal Grandfather        liver  . Kidney disease Maternal Grandfather   . Cancer Paternal Grandmother        lung  . Kidney disease Brother   . Breast cancer Maternal Grandmother 60  . Bipolar disorder Daughter   . Hypertension Daughter   . Restless legs syndrome Daughter     Social History   Tobacco Use  . Smoking status: Never Smoker  . Smokeless tobacco: Never Used  Vaping Use  . Vaping Use: Never used  Substance Use Topics  . Alcohol use: No  . Drug use: No    Home Medications Prior to Admission medications   Medication Sig Start Date End Date Taking? Authorizing Provider  albuterol (VENTOLIN HFA) 108 (90 Base) MCG/ACT inhaler Inhale 2 puffs into the lungs every 6 (six) hours as needed for wheezing or shortness of breath. 08/12/19  Yes Hubbard Hartshorn, FNP  atorvastatin (LIPITOR) 80 MG tablet Take 80 mg by mouth at bedtime.    Yes  [provider]  busPIRone (BUSPAR) 10 MG tablet Take 20 mg by mouth 2 (two) times daily.    Yes [provider]  citalopram (CELEXA) 20 MG tablet Take 20 mg by mouth daily. 01/16/20  Yes [provider]  dexamethasone (DECADRON) 2 MG tablet Take 2 mg by mouth 3 (three) times daily. 05/03/20  Yes [provider]  empagliflozin (JARDIANCE) 25 MG TABS tablet Take 25 mg by mouth daily.   Yes [provider]  ezetimibe (ZETIA) 10 MG tablet Take 10 mg by mouth daily.  01/05/17  Yes [provider]  fenofibrate micronized (LOFIBRA) 134 MG capsule Take 134 mg by mouth daily.   Yes [provider]  fluticasone (FLONASE) 50 MCG/ACT nasal spray Place 2 sprays into both nostrils daily. 01/26/20  Yes Sowles, Drue Stager, MD  gabapentin (NEURONTIN) 300 MG capsule Take 1-2 capsules (300-600 mg total) by mouth 3 (three) times daily. Take 1 capsule each morning, 1 capsule each afternoon, and 2 capsules at bedtime. 03/17/20  Yes Sowles, Drue Stager, MD  insulin aspart (NOVOLOG FLEXPEN) 100 UNIT/ML FlexPen Inject 3 Units subcutaneously. Sliding scale.   Yes [provider]  insulin regular human CONCENTRATED (HUMULIN R) 500 UNIT/ML injection Inject 70 Units into the skin 3 (three) times daily with meals.   Yes [provider]  lamoTRIgine (LAMICTAL) 200 MG tablet Take 200 mg by mouth 2 (two) times daily.    Yes [provider]  levothyroxine (SYNTHROID) 50 MCG tablet TAKE ONE TABLET DAILY 3 DAYS A WEEK, THEN TAKE ONE AND ONE-HALF TABLETS FOUR DAYS A WEEK Patient taking differently: Take 50 mcg by mouth See admin instructions. Take one tablet daily for 3 days a week, then 1 1/2 tablets 4 days a week 05/11/20  Yes Sowles, Drue Stager, MD  lisinopril (ZESTRIL) 2.5 MG tablet TAKE  1 TABLET BY MOUTH EVERY DAY Patient taking differently: Take 2.5 mg by mouth daily.  01/06/20  Yes Sowles, Drue Stager, MD  metoprolol tartrate (LOPRESSOR) 25 MG tablet Take 37.5 mg  by mouth daily.   Yes [provider]  ondansetron (ZOFRAN) 4 MG tablet TAKE 1 TABLET BY MOUTH EVERY 8 HOURS AS NEEDED FOR NAUSEA OR VOMITING Patient taking differently: Take 4 mg by mouth every 8 (eight) hours as needed for nausea or vomiting.  09/15/19  Yes [name], MD  pantoprazole (PROTONIX) 40 MG tablet Take 1 tablet (40 mg total) by mouth daily. 08/20/19  Yes [name], MD  polyethylene glycol (MIRALAX) 17 g packet Take 17 g by mouth daily. Patient taking differently: Take 17 g by mouth daily as needed for mild constipation.  10/27/19  Yes Nance Pear, MD  QUEtiapine (SEROQUEL) 200 MG tablet Take 200 mg by mouth at bedtime. Along with 367m dose for a total of 5027m  Yes [provider]  vitamin C (ASCORBIC ACID) 250 MG tablet Take 1 tablet (250 mg total) by mouth daily. 05/02/20  Yes PaLavina HammanMD  Zinc 220 (50 Zn) MG CAPS Take 1 tablet by mouth daily. 05/02/20  Yes PaLavina HammanMD  dextromethorphan-guaiFENesin ([facility]) 30-600 MG 12hr tablet Take 1 tablet by mouth 2 (two) times daily. Patient not taking: Reported on 05/18/2020 05/02/20   PaLavina HammanMD    Allergies    Mirtazapine, Morphine and related, Sulfamethoxazole-trimethoprim, Amoxicillin er, Codeine, Hydrocodone-acetaminophen, Keflex [cephalexin], and Prasterone  Review of Systems   Review of Systems  Constitutional: Negative for chills and fever.  HENT: Negative for congestion and rhinorrhea.   Eyes: Negative for redness and visual disturbance.  Respiratory: Negative for shortness of breath and wheezing.   Cardiovascular: Negative for chest pain and palpitations.  Gastrointestinal: Negative for nausea and vomiting.  Genitourinary: Negative for dysuria and urgency.  Musculoskeletal: Negative for arthralgias and myalgias.  Skin: Negative for pallor and wound.  Neurological: Positive for dizziness and speech difficulty. Negative for headaches.    Physical Exam Updated Vital  Signs BP 99/62   Pulse 91   Temp 98.9 F (37.2 C) (Oral)   Resp 18   SpO2 99%   Physical Exam Vitals and nursing note reviewed.  Constitutional:      General: She is not in acute distress.    Appearance: She is well-developed. She is not diaphoretic.  HENT:     Head: Normocephalic and atraumatic.  Eyes:     Pupils: Pupils are equal, round, and reactive to light.  Cardiovascular:     Rate and Rhythm: Normal rate and regular rhythm.     Heart sounds: No murmur heard.  No friction rub. No gallop.   Pulmonary:     Effort: Pulmonary effort is normal.     Breath sounds: No wheezing or rales.  Abdominal:     General: There is no distension.     Palpations: Abdomen is soft.     Tenderness: There is no abdominal tenderness.  Musculoskeletal:        General: No tenderness.     Cervical back: Normal range of motion and neck supple.  Skin:    General: Skin is warm and dry.  Neurological:     Mental Status: She is alert and oriented to person, place, and time.     Cranial Nerves: Cranial nerves are intact.     Sensory: Sensation is intact.     Motor:  Motor function is intact.     Coordination: Coordination is intact.     Gait: Gait is intact.  Psychiatric:        Behavior: Behavior normal.     ED Results / Procedures / Treatments   Labs (all labs ordered are listed, but only abnormal results are displayed) Labs Reviewed  CBC - Abnormal; Notable for the following components:      Result Value   RBC 3.71 (*)    Hemoglobin 10.3 (*)    HCT 34.5 (*)    MCHC 29.9 (*)    RDW 15.6 (*)    All other components within normal limits  COMPREHENSIVE METABOLIC PANEL - Abnormal; Notable for the following components:   Glucose, Bld 157 (*)    BUN 54 (*)    Creatinine, Ser 1.71 (*)    Total Protein 6.1 (*)    Albumin 3.2 (*)    AST 62 (*)    ALT 67 (*)    GFR calc non Af Amer 33 (*)    GFR calc Af Amer 38 (*)    All other components within normal limits  I-STAT CHEM 8, ED -  Abnormal; Notable for the following components:   BUN 71 (*)    Creatinine, Ser 1.80 (*)    Glucose, Bld 156 (*)    Calcium, Ion 1.12 (*)    Hemoglobin 10.5 (*)    HCT 31.0 (*)    All other components within normal limits  CBG MONITORING, ED - Abnormal; Notable for the following components:   Glucose-Capillary 68 (*)    All other components within normal limits  CBG MONITORING, ED - Abnormal; Notable for the following components:   Glucose-Capillary 60 (*)    All other components within normal limits  CBG MONITORING, ED - Abnormal; Notable for the following components:   Glucose-Capillary 68 (*)    All other components within normal limits  PROTIME-INR  APTT  DIFFERENTIAL  I-STAT BETA HCG BLOOD, ED ([facility], [facility], [facility] ONLY)  CBG MONITORING, ED  CBG MONITORING, ED    EKG EKG Interpretation  Date/Time:  Tuesday May 18 2020 09:39:13 EDT Ventricular Rate:  100 PR Interval:  160 QRS Duration: 88 QT Interval:  336 QTC Calculation: 433 R Axis:   -23 Text Interpretation: Normal sinus rhythm Normal ECG No significant change since last tracing Confirmed by Deno Etienne [phone]) on 05/18/2020 2:35:47 PM   Radiology CT HEAD WO CONTRAST  Result Date: 05/18/2020 CLINICAL DATA:  Mental status changes EXAM: CT HEAD WITHOUT CONTRAST TECHNIQUE: Contiguous axial images were obtained from the base of the skull through the vertex without intravenous contrast. COMPARISON:  04/28/2020 FINDINGS: Brain: No acute intracranial abnormality. Specifically, no hemorrhage, hydrocephalus, mass lesion, acute infarction, or significant intracranial injury. Vascular: No hyperdense vessel or unexpected calcification. Skull: No acute calvarial abnormality. Sinuses/Orbits: No acute findings Other: None IMPRESSION: No acute intracranial abnormality. Electronically Signed   By: Rolm Baptise M.D.   On: 05/18/2020 10:35    Procedures Procedures (including critical care time)  Medications Ordered in ED Medications   sodium chloride flush (NS) 0.9 % injection 3 mL (3 mLs Intravenous Not Given 05/18/20 1516)  lactated ringers bolus 1,000 mL (1,000 mLs Intravenous New Bag/Given 05/18/20 1414)    ED Course  I have reviewed the triage vital signs and the nursing notes.  Pertinent labs & imaging results that were available during my care of the patient were reviewed by me and considered in my medical decision  making (see chart for details).    MDM Rules/Calculators/A&P                          54 yo F with a chief complaints of dizziness.  This occurred off and on for the past few weeks.  Started after she was diagnosed with the novel coronavirus.  She denies any continued difficulty breathing with that.  Feels like she has been eating and drinking normally.  Orthostatic on arrival and with EMS.  Also noted to be hypoglycemic here.  Will give an oral trial bolus of IV fluids and reassess.  Neuro exam for me benign.  I discussed the case with Dr. Leonel Ramsay, neurology felt that this was unlikely to be a stroke based on history and felt that if she had had a stroke and her symptoms been going on for 2 weeks it should be seen on CT.  CT was negative.  Lab work with mild worsening uremia.   Able to ambulate here.   D/c post fluid bolus.  CBG improved with diet.   3:21 PM:  I have discussed the diagnosis/risks/treatment options with the patient and believe the pt to be eligible for discharge home to follow-up with PCP. We also discussed returning to the ED immediately if new or worsening sx occur. We discussed the sx which are most concerning (e.g., sudden worsening pain, fever, inability to tolerate by mouth) that necessitate immediate return. Medications administered to the patient during their visit and any new prescriptions provided to the patient are listed below.  Medications given during this visit Medications  sodium chloride flush (NS) 0.9 % injection 3 mL (3 mLs Intravenous Not Given 05/18/20 1516)   lactated ringers bolus 1,000 mL (1,000 mLs Intravenous New Bag/Given 05/18/20 1414)     The patient appears reasonably screen and/or stabilized for discharge and I doubt any other medical condition or other [facility] requiring further screening, evaluation, or treatment in the ED at this time prior to discharge.   Final Clinical Impression(s) / ED Diagnoses Final diagnoses:  Dehydration    Rx / DC Orders ED Discharge Orders    None       Deno Etienne, DO 05/18/20 1522

## 2020-06-01 ENCOUNTER — Telehealth: Payer: Self-pay | Admitting: Family Medicine

## 2020-06-01 NOTE — Telephone Encounter (Signed)
Called to get verbal orders to extend PT for patient - 1x wk 5.  CB# [phone]

## 2020-06-01 NOTE — Telephone Encounter (Addendum)
[name] with [facility] needs to report a fall pt had Sunday night. Pt just a little sore. No other injury.  Pt states she felt like her legs just gave out from under her.   cb [phone]

## 2020-06-02 ENCOUNTER — Ambulatory Visit: Payer: Medicare HMO | Admitting: Family Medicine

## 2020-06-02 NOTE — Telephone Encounter (Signed)
Called [name]. [name] aware.

## 2020-06-07 ENCOUNTER — Other Ambulatory Visit: Payer: Self-pay

## 2020-06-07 ENCOUNTER — Ambulatory Visit (INDEPENDENT_AMBULATORY_CARE_PROVIDER_SITE_OTHER): Payer: Medicare HMO | Admitting: Physician Assistant

## 2020-06-07 ENCOUNTER — Encounter: Payer: Self-pay | Admitting: Physician Assistant

## 2020-06-07 VITALS — BP 140/80 | HR 95 | Ht 59.0 in | Wt 195.2 lb

## 2020-06-07 DIAGNOSIS — I10 Essential (primary) hypertension: Secondary | ICD-10-CM

## 2020-06-07 DIAGNOSIS — R079 Chest pain, unspecified: Secondary | ICD-10-CM

## 2020-06-07 DIAGNOSIS — R000 Tachycardia, unspecified: Secondary | ICD-10-CM | POA: Diagnosis not present

## 2020-06-07 DIAGNOSIS — Z8616 Personal history of COVID-19: Secondary | ICD-10-CM | POA: Diagnosis not present

## 2020-06-07 DIAGNOSIS — E1151 Type 2 diabetes mellitus with diabetic peripheral angiopathy without gangrene: Secondary | ICD-10-CM

## 2020-06-07 DIAGNOSIS — R002 Palpitations: Secondary | ICD-10-CM

## 2020-06-07 MED ORDER — METOPROLOL TARTRATE 25 MG PO TABS: 12.5000 mg | ORAL_TABLET | Freq: Every day | ORAL | 5 refills | Status: DC

## 2020-06-07 NOTE — Patient Instructions (Signed)
Medication Instructions:  Your physician has recommended you make the following change in your medication:   RESUME Metoprolol at a lower dose of 12.5 mg (1/2 tablet) twice daily. An Rx has been sent to your pharmacy.  *If you need a refill on your cardiac medications before your next appointment, please call your pharmacy*   Lab Work: None ordered If you have labs (blood work) drawn today and your tests are completely normal, you will receive your results only by: Marland Kitchen MyChart Message (if you have MyChart) OR . A paper copy in the mail If you have any lab test that is abnormal or we need to change your treatment, we will call you to review the results.   Testing/Procedures: Your physician has requested that you have an echocardiogram. Echocardiography is a painless test that uses sound waves to create images of your heart. It provides your doctor with information about the size and shape of your heart and how well your heart's chambers and valves are working. This procedure takes approximately one hour. There are no restrictions for this procedure.     Follow-Up: At [facility], you and your health needs are our priority.  As part of our continuing mission to provide you with exceptional heart care, we have created designated Provider Care Teams.  These Care Teams include your primary Cardiologist (physician) and Advanced Practice Providers (APPs -  Physician Assistants and Nurse Practitioners) who all work together to provide you with the care you need, when you need it.  We recommend signing up for the patient portal called "MyChart".  Sign up information is provided on this After Visit Summary.  MyChart is used to connect with patients for Virtual Visits (Telemedicine).  Patients are able to view lab/test results, encounter notes, upcoming appointments, etc.  Non-urgent messages can be sent to your provider as well.   To learn more about what you can do with MyChart, go to  [email or web address].    Your next appointment:   2 week(s)  The format for your next appointment:   In Person  Provider:   You may see Ida Rogue, MD or one of the following Advanced Practice Providers on your designated Care Team:    Murray Hodgkins, NP  Christell Faith, PA-C  Marrianne Mood, PA-C  Cadence Kathlen Mody, [place]    Other Instructions You have been referred to Dr. Holley Raring at [facility]. Their office will contact you directly to schedule the appointment.

## 2020-06-07 NOTE — Progress Notes (Signed)
Office Visit    Patient Name: [name] Date of Encounter: 06/07/2020  Primary Care Provider:  Steele Sizer, MD Primary Cardiologist:  Stacey Rogue, MD  Chief Complaint    Chief Complaint  Patient presents with  . office visit    Patient reports elevated heart rate readings. "My heart feels like it is going 90 mph." Denies chest pain/SOB; Meds verbally reviewed with patient.    54 yo female h/o uncontrolled DM 2, hypothyroidism, NASH, restless leg syndrome, chronic back pain, sinusitis, depression, recent COVID-19 infection, and who presents today for follow-up of her chest pain and palpitations.  Past Medical History    Past Medical History:  Diagnosis Date  . Anxiety   . Arthritis   . Asthma   . Bipolar depression ([name])   . CHF (congestive heart failure) ([name])   . Chronic headaches    migraines have lessened  . Chronic kidney disease    ddstage 111  . COPD (chronic obstructive pulmonary disease) ([name])   . Coronary artery disease   . Depression   . Diabetic neuropathy ([name])   . Dyspnea    uses inhalers for this  . Dysrhythmia    rapid  . Fatty liver   . GERD (gastroesophageal reflux disease)   . Headache   . Heart attack ([name]) 2009  . Hyperlipidemia   . Hypertension   . Hypomagnesemia   . Hypothyroid   . Irregular heart rhythm   . Liver lesion    Favored to be benign on CT; MRI of abd w/contract in Aug 2016. Liver Biopsy-Negative, March 2016  . Morbid obesity ([place])   . OSA (obstructive sleep apnea)   . Peripheral vascular disease ([place])   . Pinched nerve    Back  . Pneumonia   . RLS (restless legs syndrome)   . Seasonal allergies   . Sleep apnea    uses oxygen at night 2L d/t cpap did not fit  . Type 2 diabetes, uncontrolled, with neuropathy ([name]) 10/15/2015   Overview:  Microalbumin 7.7 12/2010: Hgb A1c 10.2% 12/2010 and 9.7% 03/2011, eye exam 10/2009 [facility] DM [facility]   . Urinary incontinence   . Vitamin D deficiency    Past Surgical History:    Procedure Laterality Date  . ABDOMINAL HYSTERECTOMY     Partial  . bladder stim  2007  . BLADDER SURGERY     x 2. tacking of bladder and the other was the stimulator placement  . CHOLECYSTECTOMY    . COLONOSCOPY WITH PROPOFOL N/A 03/04/2019   Procedure: COLONOSCOPY WITH PROPOFOL;  Surgeon: Stacey Lame, MD;  Location: [name];  Service: Yang;  Laterality: N/A;  . ESOPHAGOGASTRODUODENOSCOPY (EGD) WITH PROPOFOL N/A 03/04/2019   Procedure: ESOPHAGOGASTRODUODENOSCOPY (EGD) WITH PROPOFOL;  Surgeon: Stacey Lame, MD;  Location: [name];  Service: Yang;  Laterality: N/A;  . INCISION AND DRAINAGE ABSCESS Right 04/16/2019   Procedure: INCISION AND DRAINAGE ABSCESS;  Surgeon: Stacey Sprague, DO;  Location: [name];  Service: General;  Laterality: Right;  . INTERSTIM-GENERATOR CHANGE N/A 12/31/2017   Procedure: INTERSTIM-GENERATOR CHANGE;  Surgeon: Stacey Loser, MD;  Location: [name];  Service: Urology;  Laterality: N/A;  . LIVER BIOPSY  March 2016   Negative  . LUNG BIOPSY  2015   small spot on lung  . TUBAL LIGATION    . UPPER GI Yang    . WRIST SURGERY Right 11/02/2019    Allergies  Allergies  Allergen Reactions  . Mirtazapine Anaphylaxis  REACTION: closes throat  . Morphine And Related     anaphylaxis  . Sulfamethoxazole-Trimethoprim Rash    REACTION: Whelps all over  . Amoxicillin Er Nausea And Vomiting    Has patient had a PCN reaction causing immediate rash, facial/tongue/throat swelling, SOB or lightheadedness with hypotension: No Has patient had a PCN reaction causing severe rash involving mucus membranes or skin necrosis: No Has patient had a PCN reaction that required hospitalization: No Has patient had a PCN reaction occurring within the last 10 years: Yes If all of the above answers are "NO", then may proceed with Cephalosporin use.  . Codeine Itching    REACTION: itch, rash  . Hydrocodone-Acetaminophen Rash    REACTION: rash  . Keflex  [Cephalexin] Rash  . Prasterone Nausea And Vomiting    History of Present Illness    [name] is a 54 y.o. female with PMH as above.     She presented to the emergency department 06/26/2017 with chest pain, described as burning central chest pain that was nonradiating and going on since earlier that morning.  Chest pain was mild to moderate in intensity, constant for the last 8 to 9 hours, and somewhat pleuritic.  It was also worse when laying flat and better sitting upright.   2018 CT chest scan was reviewed with patient at her last visit and noted to be unable to exclude mild coronary artery calcification with mid LAD otherwise no aortic atherosclerosis.  She was last seen 11/12/2019 by her primary cardiologist, Dr. Rockey Yang.  At that time, she reported she was tired and having some chest pain at rest and occasionally with exertion.  She reported staying in the house mostly and playing on her computer.  She also noted generalized fatigue and weakness with multiple falls.  She was being evaluated by endocrine for DM2 via [name].  She reported heart racing on metoprolol.  Her husband was helping with groceries.     Echo was ordered with recommendation to consider future stress. Walker was recommended, as well as glycemic control.  Echo showed EF 55 to 60%, NRWMA.   In 04/2020 she reports testing positive for COVID-19 after having a cough, fever, and SOB. She was admitted for 4 days then discharged home. I am unable to find the positive COVID test to confirm this diagnosis, though she was admitted during that time.   04/2020 CT scan performed as below.  She was seen at [name] ED 05/2020 for dehydration. She reported episodes of bouncing off of the walls and slurred speech. She was on 2L [name] O2 at night and had not been able to go back to her normal home O2. She was noted to be orthostatic and hypoglycemic and treated with bolus of IV fluids. Case was discussed with neuro, which though the case not  consistent with stroke Lab work showed worsening uremia.   Today, 06/07/2020, she returns to clinic and notes ongoing/chronic dizziness.  She reports this occurs all of the time.  She has fallen 13 times in the last year.  She continues to note shortness of breath at rest and with exertion.  She is currently using 3 pillows.  She reports SBP 1 10-1 30s.  She states that she has considerable orthopnea and unable to lay flat due to inability to breathe.  Current BP 140/80 with 95% oxygen saturation on room air.  She reports recent issues with nighttime oxygen and ongoing need for oxygen since her COVID-19 status/positive COVID-19 infection.  She  has not had a repeat echo since COVID-19 with this discussed in great detail today, given her ongoing symptoms.  She reports medication compliance.  She continues to note atypical chest pain, which is similar to that reported above and noted to be left-sided and mild to moderate in intensity.  Pain is noted to be worse with position changes as well as deep breathing.  No current chest pain at the time of her visit.  She notes that she was told to discontinue her metoprolol with heart rate today 95 bpm and patient report of racing heart rate and palpitations since discontinuation of her beta-blocker.  She can sometimes hear her HR in her ears as noted today. No additional medication provided in lieu of beta-blocker with discontinuation of Lopressor.  Recent corticosteroids as noted on her medication list have since been discontinued with discussion that these can sometimes exacerbate rates and BP.  No signs or symptoms of bleeding.  She reports medication compliance.  Home Medications    Prior to Admission medications   Medication Sig Start Date End Date Taking? Authorizing Provider  albuterol (VENTOLIN HFA) 108 (90 Base) MCG/ACT inhaler Inhale 2 puffs into the lungs every 6 (six) hours as needed for wheezing or shortness of breath. 08/12/19   Hubbard Hartshorn, FNP    atorvastatin (LIPITOR) 80 MG tablet Take 80 mg by mouth at bedtime.     [provider]  busPIRone (BUSPAR) 10 MG tablet Take 20 mg by mouth 2 (two) times daily.     [provider]  citalopram (CELEXA) 20 MG tablet Take 20 mg by mouth daily. 01/16/20   [provider]  dexamethasone (DECADRON) 2 MG tablet Take 2 mg by mouth 3 (three) times daily. 05/03/20   [provider]  dextromethorphan-guaiFENesin (MUCINEX DM) 30-600 MG 12hr tablet Take 1 tablet by mouth 2 (two) times daily. Patient not taking: Reported on 05/18/2020 05/02/20   Lavina Hamman, MD  empagliflozin (JARDIANCE) 25 MG TABS tablet Take 25 mg by mouth daily.    [provider]  ezetimibe (ZETIA) 10 MG tablet Take 10 mg by mouth daily.  01/05/17   [provider]  fenofibrate micronized (LOFIBRA) 134 MG capsule Take 134 mg by mouth daily.    [provider]  fluticasone (FLONASE) 50 MCG/ACT nasal spray Place 2 sprays into both nostrils daily. 01/26/20   Stacey Sizer, MD  gabapentin (NEURONTIN) 300 MG capsule Take 1-2 capsules (300-600 mg total) by mouth 3 (three) times daily. Take 1 capsule each morning, 1 capsule each afternoon, and 2 capsules at bedtime. 03/17/20   Stacey Sizer, MD  insulin aspart (NOVOLOG FLEXPEN) 100 UNIT/ML FlexPen Inject 3 Units subcutaneously. Sliding scale.    [provider]  insulin regular human CONCENTRATED (HUMULIN R) 500 UNIT/ML injection Inject 70 Units into the skin 3 (three) times daily with meals.    [provider]  lamoTRIgine (LAMICTAL) 200 MG tablet Take 200 mg by mouth 2 (two) times daily.     [provider]  levothyroxine (SYNTHROID) 50 MCG tablet TAKE ONE TABLET DAILY 3 DAYS A WEEK, THEN TAKE ONE AND ONE-HALF TABLETS FOUR DAYS A WEEK Patient taking differently: Take 50 mcg by mouth See admin instructions. Take one tablet daily for 3 days a week, then 1 1/2 tablets 4 days a week 05/11/20   Stacey Sizer, MD   lisinopril (ZESTRIL) 2.5 MG tablet TAKE 1 TABLET BY MOUTH EVERY DAY Patient taking differently: Take 2.5 mg by mouth  daily.  01/06/20   Stacey Sizer, MD  metoprolol tartrate (LOPRESSOR) 25 MG tablet Take 37.5 mg by mouth daily.    [provider]  ondansetron (ZOFRAN) 4 MG tablet TAKE 1 TABLET BY MOUTH EVERY 8 HOURS AS NEEDED FOR NAUSEA OR VOMITING Patient taking differently: Take 4 mg by mouth every 8 (eight) hours as needed for nausea or vomiting.  09/15/19   Stacey Lame, MD  pantoprazole (PROTONIX) 40 MG tablet Take 1 tablet (40 mg total) by mouth daily. 08/20/19   Stacey Lame, MD  polyethylene glycol (MIRALAX) 17 g packet Take 17 g by mouth daily. Patient taking differently: Take 17 g by mouth daily as needed for mild constipation.  10/27/19   Nance Pear, MD  QUEtiapine (SEROQUEL) 200 MG tablet Take 200 mg by mouth at bedtime. Along with 380m dose for a total of 5068m   [provider]  vitamin C (ASCORBIC ACID) 250 MG tablet Take 1 tablet (250 mg total) by mouth daily. 05/02/20   PaLavina HammanMD  Zinc 220 (50 Zn) MG CAPS Take 1 tablet by mouth daily. 05/02/20   PaLavina HammanMD    Review of Systems    She reports ongoing chest pain, similar to previously reported CP at earlier visits. She notes racing HR and palpitations, worsened since discontinuing her BB. She notes dyspnea, pnd, and orthopnea,. She reports chronic dizziness,\. No syncope, edema, weight gain, or early satiety. She reports reduced appetite, but this was in the setting of reduced taste and smell and noted to be resolving and less consistent with early satiety 2/2 worsening HF.   All other systems reviewed and are otherwise negative except as noted above.  Physical Exam    VS:  BP 140/80 (BP Location: Left Arm, Patient Position: Sitting, Cuff Size: Large)   Pulse 95   Ht 4' 11"  (1.499 m)   Wt 195 lb 4 oz (88.6 kg)   SpO2 95%   BMI 39.44 kg/m  , BMI Body mass index is 39.44 kg/m. GEN:  Well nourished, well developed, in no acute distress. HEENT: normal. Neck: Supple, no JVD, carotid bruits, or masses. Cardiac: RRR, no murmurs, rubs, or gallops. No clubbing, cyanosis, edema.  Radials/DP/PT 2+ and equal bilaterally.  Respiratory:  Respirations regular and unlabored, clear to auscultation bilaterally. GI: Soft, nontender, nondistended, BS + x 4. MS: no deformity or atrophy. Skin: warm and dry, no rash. Neuro:  Strength and sensation are intact. Psych: Normal affect.  Accessory Clinical Findings    ECG personally reviewed by me today - NSR, 95bpm- no acute changes.  VITALS Reviewed today   Temp Readings from Last 3 Encounters:  05/18/20 98.2 F (36.8 C) (Oral)  05/02/20 98.4 F (36.9 C) (Oral)  04/01/20 97.6 F (36.4 C) (Oral)   BP Readings from Last 3 Encounters:  06/07/20 140/80  05/18/20 122/74  05/02/20 114/72   Pulse Readings from Last 3 Encounters:  06/07/20 95  05/18/20 94  05/02/20 81    Wt Readings from Last 3 Encounters:  06/07/20 195 lb 4 oz (88.6 kg)  04/29/20 193 lb 12.6 oz (87.9 kg)  03/31/20 190 lb 0.6 oz (86.2 kg)     LABS  reviewed today   Lab Results  Component Value Date   WBC 5.9 05/18/2020   HGB 10.5 (L) 05/18/2020   HCT 31.0 (L) 05/18/2020   MCV 93.0 05/18/2020   PLT 203 05/18/2020   Lab Results  Component Value Date  CREATININE 1.80 (H) 05/18/2020   BUN 71 (H) 05/18/2020   NA 140 05/18/2020   K 4.7 05/18/2020   CL 106 05/18/2020   CO2 23 05/18/2020   Lab Results  Component Value Date   ALT 67 (H) 05/18/2020   AST 62 (H) 05/18/2020   ALKPHOS 46 05/18/2020   BILITOT 0.5 05/18/2020   Lab Results  Component Value Date   CHOL 196 09/28/2016   HDL 33 (L) 09/28/2016   LDLCALC NOT CALC 09/28/2016   LDLDIRECT 65.4 06/08/2008   TRIG 856 (H) 09/28/2016   CHOLHDL 5.9 (H) 09/28/2016    Lab Results  Component Value Date   HGBA1C 10.3 (H) 03/31/2020   Lab Results  Component Value Date   TSH 1.17 02/24/2019      STUDIES/PROCEDURES reviewed today   Echo 12/04/19 1. Left ventricular ejection fraction, by estimation, is 55 to 60%. The  left ventricle has normal function. The left ventricle has no regional  wall motion abnormalities. Left ventricular diastolic parameters were  normal.  2. Right ventricular systolic function is normal. The right ventricular  size is normal. There is normal pulmonary artery systolic pressure.  3. The mitral valve is normal in structure. No evidence of mitral valve  regurgitation. No evidence of mitral stenosis.  4. The aortic valve is normal in structure. Aortic valve regurgitation is  not visualized. No aortic stenosis is present.  5. The inferior vena cava is normal in size with greater than 50%  respiratory variability, suggesting right atrial pressure of 3 mmHg.   CTA 04/28/2020 IMPRESSION: 1. No pulmonary embolism.  Study is mildly limited by bolus timing. 2. Bilateral ground-glass opacities throughout the chest with peripheral predominance. Findings are consistent with the provided history of COVID-19 infection. 3. Mildly enlarged mediastinal and hilar lymph nodes, likely reactive. 4. Hepatic steatosis. 5. Aortic atherosclerosis.  Aortic Atherosclerosis (ICD10-I70.0).   Assessment & Plan    Recent COVID-19 --Repeat echo as above. She denies repeated echo since COVID-19. Recommend restart of BB as tolerated by breathing status given tachypalpitations and elevated rates today as well.  HTN, BP goal <130/80 --BP today sub-optimal with BB discontinued as above. Previous notes have indicated a concern for dehydration, reviewed today. Given elevated BP, restart BB as breathing status tolerates above given recent COVID-19. Will reassess EF with further recommendations at that time. If EF allows, and if breathing worse on BB with recent COIVD-19, may need to consider transition to CCB / diltiazem. Reassess following echo. Reviewed recommendations for  total daily fluid and salt intake  HFpEF --Wt increase and BP increase noted. Volume status difficult to ascertain given body habitus.  Weight remains stable when compared with previous in clinic visit with primary cardiologist.  Update echo as above and given COVID-19 infection with discontinuation of BB as outlined above.  Restart beta-blocker as tolerated, given recent pulmonary symptoms.  No adjustment to diuresis with referral to nephrology as indicated below.   HLD --Continue cholesterol medications.  COPD --As above, repeat echo and could consider transition to diltiazem pending breathing status with restart of BB above. O2 saturations discussed today, given low SpO2 and with pt indicating she is on O2 at home.   OSA --Continue home O2 / BiPAP / CPAP as recommended per pulmonary. Ongoing dietary and lifestyle recommendations / wt loss advised.   CKD --Discussed recommendation for close monitoring of worsening renal function. Hesitant to escalate diuresis given worsening renal function. Reports FHx of ARF with family members that see  Dr. Holley Raring. Refer to nephrology.    Disposition: Update echo. Restart BB as tolerated. Further recommendations if indicated pending echo. Refer to nephrology given pt report of family history of kidney failure with family that sees Dr. Holley Raring per pt report.    Arvil Chaco, PA-C 06/07/2020

## 2020-06-08 ENCOUNTER — Ambulatory Visit (INDEPENDENT_AMBULATORY_CARE_PROVIDER_SITE_OTHER): Payer: Medicare HMO

## 2020-06-08 ENCOUNTER — Telehealth: Payer: Self-pay

## 2020-06-08 DIAGNOSIS — Z8616 Personal history of COVID-19: Secondary | ICD-10-CM

## 2020-06-08 DIAGNOSIS — R079 Chest pain, unspecified: Secondary | ICD-10-CM

## 2020-06-08 LAB — ECHOCARDIOGRAM COMPLETE
AR max vel: 2.4 cm2
AV Area VTI: 2.65 cm2
AV Area mean vel: 2.25 cm2
AV Mean grad: 3 mmHg
AV Peak grad: 6.2 mmHg
Ao pk vel: 1.24 m/s
Area-P 1/2: 4.83 cm2
Calc EF: 58.2 %
S' Lateral: 2.9 cm
Single Plane A2C EF: 60.6 %
Single Plane A4C EF: 57.6 %

## 2020-06-08 MED ORDER — METOPROLOL TARTRATE 25 MG PO TABS: 12.5000 mg | ORAL_TABLET | Freq: Two times a day (BID) | ORAL | 5 refills | Status: DC

## 2020-06-08 NOTE — Telephone Encounter (Signed)
Referral for nephrology faxed to Dr. Elwyn Lade office [facility]. Attached: [facility] referral sheet Patient demo Most recent o/v notes 6 mo lab results  Fax confirmation received.

## 2020-06-08 NOTE — Addendum Note (Signed)
Addended by: Lamar Laundry on: 06/08/2020 10:51 AM   Modules accepted: Orders

## 2020-06-12 LAB — HEMOGLOBIN A1C: Hemoglobin A1C: 9.6

## 2020-06-14 ENCOUNTER — Telehealth: Payer: Self-pay | Admitting: Physician Assistant

## 2020-06-14 NOTE — Telephone Encounter (Signed)
Pt advised and verbalized understanding of her echo results and will keep her follow up 06/25/20.   Pt reports that she is getting her COVID vaccine 06/15/20.

## 2020-06-14 NOTE — Telephone Encounter (Signed)
Stephani Police, RN  06/14/2020 9:19 AM EDT     LMTCB... pt has follow up 06/25/20   Arvil Chaco, PA-C  06/13/2020 10:11 PM EDT     Echo shows normal pump function. The walls of the heart are squeezing appropriately. There is moderate asymmetric thickening of the left bottom inside chamber of the heart, which we will monitor. The right heart pressures are low normal. There is a trivial leak from the mitral and tricuspid valves. There is no fluid surrounding the heart. There is no inflammation of the heart muscle s/p COVID-19. Overall, this is a reassuring study. We can revisit and discuss in detail at our upcoming visit.

## 2020-06-14 NOTE — Telephone Encounter (Signed)
Patient returning call for results 

## 2020-06-15 ENCOUNTER — Ambulatory Visit (INDEPENDENT_AMBULATORY_CARE_PROVIDER_SITE_OTHER): Payer: Medicare HMO | Admitting: Family Medicine

## 2020-06-15 ENCOUNTER — Encounter: Payer: Self-pay | Admitting: Family Medicine

## 2020-06-15 ENCOUNTER — Other Ambulatory Visit: Payer: Self-pay

## 2020-06-15 VITALS — BP 122/74 | HR 89 | Temp 98.0°F | Resp 20 | Ht 62.0 in | Wt 187.6 lb

## 2020-06-15 DIAGNOSIS — B373 Candidiasis of vulva and vagina: Secondary | ICD-10-CM

## 2020-06-15 DIAGNOSIS — B3731 Acute candidiasis of vulva and vagina: Secondary | ICD-10-CM

## 2020-06-15 DIAGNOSIS — L02811 Cutaneous abscess of head [any part, except face]: Secondary | ICD-10-CM | POA: Diagnosis not present

## 2020-06-15 DIAGNOSIS — H60392 Other infective otitis externa, left ear: Secondary | ICD-10-CM

## 2020-06-15 DIAGNOSIS — H00025 Hordeolum internum left lower eyelid: Secondary | ICD-10-CM | POA: Diagnosis not present

## 2020-06-15 DIAGNOSIS — Z23 Encounter for immunization: Secondary | ICD-10-CM | POA: Diagnosis not present

## 2020-06-15 MED ORDER — DOXYCYCLINE HYCLATE 100 MG PO TABS: 100.0000 mg | ORAL_TABLET | Freq: Two times a day (BID) | ORAL | 0 refills | Status: DC

## 2020-06-15 MED ORDER — FLUCONAZOLE 150 MG PO TABS: 150.0000 mg | ORAL_TABLET | ORAL | 0 refills | Status: DC

## 2020-06-15 NOTE — Progress Notes (Signed)
Name: [name]   MRN: [id number]    DOB: 03/21/1966   Date:06/15/2020       Progress Note  Subjective  Chief Complaint  Abscess   HPI  Recurrent skin infections: she states about one week ago she developed an abscess on top of her head, she was able to drain a lot of pus out of it and it feels better now, however after that she noticed a bump on her left ear lobe, also drained some purulent material but now is just tender and has a tender spot on right ear lobe. She denies fever, chills, she has some nausea ( chronic) , no vomiting.   Sty: she noticed a bump on left lower eye lid, tender to touch, no eye drainage  Patient Active Problem List   Diagnosis Date Noted  . Iron deficiency anemia due to chronic blood loss   . Polyp of ascending colon   . Chronic gastric ulcer with hemorrhage   . Type 2 diabetes with decreased circulation ([place]) 11/07/2018  . Vitamin B12 deficiency 08/08/2018  . Encounter for Medicare annual wellness exam 10/30/2017  . Patient is full code 10/30/2017  . Disorder of bursae of shoulder region 10/09/2017  . Coronary artery disease 10/09/2017  . Plantar fascial fibromatosis 10/09/2017  . Tendinitis of wrist 10/09/2017  . Schizoaffective disorder, bipolar type ([place]) 09/28/2017  . Chest pain 09/10/2017  . 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 Breast cancer screening 09/28/2016  . Fatigue 05/01/2016  . Adenomatous colon polyp 10/15/2015  . Bipolar affective disorder ([place]) 10/15/2015  . CN (constipation) 10/15/2015  . Drug-induced hepatic toxicity 10/15/2015  . Blood in feces 10/15/2015  . HLD (hyperlipidemia) 10/15/2015  . Hypoxemia 10/15/2015  . NASH (nonalcoholic steatohepatitis) 10/15/2015  . Morbid obesity ([place]) 10/15/2015  . Restless leg syndrome 10/15/2015  . Type 2 diabetes, uncontrolled, with neuropathy ([place]) 10/15/2015  . Elevated alkaline phosphatase level 09/22/2015  . Low back pain with left-sided sciatica 08/18/2015   . Encounter for monitoring tricyclic antidepressant therapy 05/13/2015  . Bipolar disorder ([place]) 05/11/2015  . Apnea, sleep 05/05/2015  . Heart valve disease 05/05/2015  . Chronic kidney disease, stage III (moderate) (HCC) 03/15/2015  . Migraine 03/12/2015  . COPD (chronic obstructive pulmonary disease) ([place]) 01/20/2015  . 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[place]) 01/20/2015  . GERD (gastroesophageal reflux disease) 01/20/2015  . Hypothyroid   . Vitamin D deficiency   . Urinary incontinence   . Liver lesion   . Hypomagnesemia   . Obstructive sleep apnea 10/20/2013  . Vaginitis 12/19/2011  . PERIPHERAL NEUROPATHY 03/04/2009  . HEMORRHOIDS, EXTERNAL 09/03/2008  . ASTHMA, PERSISTENT, MODERATE 07/04/2007  . INTERSTITIAL CYSTITIS 07/04/2007  . Allergic rhinitis 07/04/2007  . HYPERTENSION, BENIGN ESSENTIAL 06/19/2007    Past Surgical History:  Procedure Laterality Date  . ABDOMINAL HYSTERECTOMY     Partial  . bladder stim  2007  . BLADDER SURGERY     x 2. tacking of bladder and the other was the stimulator placement  . CHOLECYSTECTOMY    . COLONOSCOPY WITH PROPOFOL N/A 03/04/2019   Procedure: COLONOSCOPY WITH PROPOFOL;  Surgeon: [name], MD;  [facility] ENDOSCOPY;  Service: Endoscopy;  Laterality: N/A;  . ESOPHAGOGASTRODUODENOSCOPY (EGD) WITH PROPOFOL N/A 03/04/2019   Procedure: ESOPHAGOGASTRODUODENOSCOPY (EGD) WITH PROPOFOL;  Surgeon: [name], MD;  Location: [facility] ENDOSCOPY;  Service: Endoscopy;  Laterality: N/A;  . INCISION AND DRAINAGE ABSCESS Right 04/16/2019   Procedure: INCISION AND DRAINAGE ABSCESS;  Surgeon:  Lysle Pearl, Isami, DO;  Location: [facility] ORS;  Service: General;  Laterality: Right;  . INTERSTIM-GENERATOR CHANGE N/A 12/31/2017   Procedure: INTERSTIM-GENERATOR CHANGE;  Surgeon: Bjorn Loser, MD;  Location: [facility] ORS;  Service: Urology;  Laterality: N/A;  . LIVER BIOPSY  March 2016   Negative  . LUNG BIOPSY  2015   small spot on lung  . TUBAL LIGATION    . UPPER  GI ENDOSCOPY    . WRIST SURGERY Right 11/02/2019    Family History  Problem Relation Age of Onset  . Diabetes Mother   . Heart disease Mother   . Hyperlipidemia Mother   . Hypertension Mother   . Kidney disease Mother   . Mental illness Mother   . Hypothyroidism Mother   . Stroke Mother   . Osteoporosis Mother   . Glaucoma Mother   . Congestive Heart Failure Mother   . Hypertension Father   . Asthma Daughter   . Cancer Daughter        throat  . Bipolar disorder Daughter   . Heart disease Maternal Grandfather   . Rheum arthritis Maternal Grandfather   . Cancer Maternal Grandfather        liver  . Kidney disease Maternal Grandfather   . Cancer Paternal Grandmother        lung  . Kidney disease Brother   . Breast cancer Maternal Grandmother 60  . Bipolar disorder Daughter   . Hypertension Daughter   . Restless legs syndrome Daughter     Social History   Tobacco Use  . Smoking status: Never Smoker  . Smokeless tobacco: Never Used  Substance Use Topics  . Alcohol use: No     Current Outpatient Medications:  .  albuterol (VENTOLIN HFA) 108 (90 Base) MCG/ACT inhaler, Inhale 2 puffs into the lungs every 6 (six) hours as needed for wheezing or shortness of breath., Disp: 18 g, Rfl: 3 .  atorvastatin (LIPITOR) 80 MG tablet, Take 80 mg by mouth at bedtime. , Disp: , Rfl:  .  busPIRone (BUSPAR) 10 MG tablet, Take 20 mg by mouth 2 (two) times daily. , Disp: , Rfl:  .  citalopram (CELEXA) 20 MG tablet, Take 20 mg by mouth daily., Disp: , Rfl:  .  empagliflozin (JARDIANCE) 25 MG TABS tablet, Take 25 mg by mouth daily., Disp: , Rfl:  .  ezetimibe (ZETIA) 10 MG tablet, Take 10 mg by mouth daily. , Disp: , Rfl:  .  fenofibrate micronized (LOFIBRA) 134 MG capsule, Take 134 mg by mouth daily., Disp: , Rfl:  .  fluticasone (FLONASE) 50 MCG/ACT nasal spray, Place 2 sprays into both nostrils daily., Disp: 16 g, Rfl: 0 .  gabapentin (NEURONTIN) 300 MG capsule, Take 1-2 capsules  (300-600 mg total) by mouth 3 (three) times daily. Take 1 capsule each morning, 1 capsule each afternoon, and 2 capsules at bedtime., Disp: 360 capsule, Rfl: 0 .  gabapentin (NEURONTIN) 600 MG tablet, Take 600 mg by mouth daily., Disp: , Rfl:  .  insulin aspart (NOVOLOG FLEXPEN) 100 UNIT/ML FlexPen, Inject 3 Units subcutaneously. Sliding scale., Disp: , Rfl:  .  insulin regular human CONCENTRATED (HUMULIN R) 500 UNIT/ML injection, Inject 70 Units into the skin 3 (three) times daily with meals., Disp: , Rfl:  .  lamoTRIgine (LAMICTAL) 200 MG tablet, Take 200 mg by mouth 2 (two) times daily. , Disp: , Rfl:  .  levothyroxine (SYNTHROID) 50 MCG tablet, TAKE ONE TABLET DAILY 3 DAYS A WEEK, THEN TAKE  ONE AND ONE-HALF TABLETS FOUR DAYS A WEEK (Patient taking differently: Take 50 mcg by mouth See admin instructions. Take one tablet daily for 3 days a week, then 1 1/2 tablets 4 days a week), Disp: 108 tablet, Rfl: 0 .  lisinopril (ZESTRIL) 2.5 MG tablet, TAKE 1 TABLET BY MOUTH EVERY DAY (Patient taking differently: Take 2.5 mg by mouth daily. ), Disp: 90 tablet, Rfl: 1 .  metoprolol tartrate (LOPRESSOR) 25 MG tablet, Take 0.5 tablets (12.5 mg total) by mouth 2 (two) times daily., Disp: 30 tablet, Rfl: 5 .  ondansetron (ZOFRAN) 4 MG tablet, TAKE 1 TABLET BY MOUTH EVERY 8 HOURS AS NEEDED FOR NAUSEA OR VOMITING (Patient taking differently: Take 4 mg by mouth every 8 (eight) hours as needed for nausea or vomiting. ), Disp: 20 tablet, Rfl: 2 .  pantoprazole (PROTONIX) 40 MG tablet, Take 1 tablet (40 mg total) by mouth daily., Disp: 90 tablet, Rfl: 3 .  polyethylene glycol (MIRALAX) 17 g packet, Take 17 g by mouth daily. (Patient taking differently: Take 17 g by mouth daily as needed for mild constipation. ), Disp: 14 each, Rfl: 0 .  QUEtiapine (SEROQUEL) 200 MG tablet, Take 200 mg by mouth at bedtime. Along with 353m dose for a total of 5036m Disp: , Rfl:  .  doxycycline (VIBRA-TABS) 100 MG tablet, Take 1 tablet  (100 mg total) by mouth 2 (two) times daily., Disp: 20 tablet, Rfl: 0 .  fluconazole (DIFLUCAN) 150 MG tablet, Take 1 tablet (150 mg total) by mouth every other day., Disp: 3 tablet, Rfl: 0  Allergies  Allergen Reactions  . Mirtazapine Anaphylaxis    REACTION: closes throat  . Morphine And Related     anaphylaxis  . Sulfamethoxazole-Trimethoprim Rash    REACTION: [place] all over  . Amoxicillin Er Nausea And Vomiting    Has patient had a PCN reaction causing immediate rash, facial/tongue/throat swelling, SOB or lightheadedness with hypotension: No Has patient had a PCN reaction causing severe rash involving mucus membranes or skin necrosis: No Has patient had a PCN reaction that required hospitalization: No Has patient had a PCN reaction occurring within the last 10 years: Yes If all of the above answers are "NO", then may proceed with Cephalosporin use.  . Codeine Itching    REACTION: itch, rash  . Hydrocodone-Acetaminophen Rash    REACTION: rash  . Keflex [Cephalexin] Rash  . Prasterone Nausea And Vomiting    I personally reviewed active problem list, medication list, allergies, family history, social history, health maintenance with the patient/caregiver today.   ROS  Ten systems reviewed and is negative except as mentioned in HPI   Objective  Vitals:   06/15/20 1409  BP: 122/74  Pulse: 89  Resp: 20  Temp: 98 F (36.7 C)  TempSrc: Oral  SpO2: 94%  Weight: 187 lb 9.6 oz (85.1 kg)  Height: 5' 2"  (1.575 m)    Body mass index is 34.31 kg/m.  Physical Exam  Constitutional: Patient appears well-developed and well-nourished. Obese No distress.  HEENT: head atraumatic, normocephalic, pupils equal and reactive to light, ears lobe erythematous, scab on left upper lobe, neck supple, left internal hordeolum  Cardiovascular: Normal rate, regular rhythm and normal heart sounds.  No murmur heard. No BLE edema. Skin: area on scalp is slightly erythematous but not draining ,  slightly tender  Pulmonary/Chest: Effort normal and breath sounds normal. No respiratory distress. Abdominal: Soft.  There is no tenderness. Psychiatric: Patient has a normal mood and affect.  behavior is normal. Judgment and thought content normal  Recent Results (from the past 2160 hour(s))  Comprehensive metabolic panel     Status: Abnormal   Collection Time: 03/31/20  4:34 PM  Result Value Ref Range   Sodium 141 135 - 145 mmol/L   Potassium 3.8 3.5 - 5.1 mmol/L   Chloride 106 98 - 111 mmol/L   CO2 24 22 - 32 mmol/L   Glucose, Bld 251 (H) 70 - 99 mg/dL    Comment: Glucose reference range applies only to samples taken after fasting for at least 8 hours.   BUN 28 (H) 6 - 20 mg/dL   Creatinine, Ser 1.31 (H) 0.44 - 1.00 mg/dL   Calcium 8.8 (L) 8.9 - 10.3 mg/dL   Total Protein 6.5 6.5 - 8.1 g/dL   Albumin 3.8 3.5 - 5.0 g/dL   AST 40 15 - 41 U/L   ALT 39 0 - 44 U/L   Alkaline Phosphatase 54 38 - 126 U/L   Total Bilirubin 0.6 0.3 - 1.2 mg/dL   GFR calc non Af Amer 45 (L) >60 mL/min   GFR calc Af Amer 53 (L) >60 mL/min   Anion gap 11 5 - 15    Comment: Performed at [facility], [address], [place], [place] [address]  Ethanol     Status: None   Collection Time: 03/31/20  4:34 PM  Result Value Ref Range   Alcohol, Ethyl (B) <10 <10 mg/dL    Comment: (NOTE) Lowest detectable limit for serum alcohol is 10 mg/dL.  For medical purposes only. Performed at [facility], [address] [place], [place] [address]   Salicylate level     Status: Abnormal   Collection Time: 03/31/20  4:34 PM  Result Value Ref Range   Salicylate Lvl <0.7 (L) 7.0 - 30.0 mg/dL    Comment: Performed at [facility], [address] [place], [place] [address]  Acetaminophen level     Status: Abnormal   Collection Time: 03/31/20  4:34 PM  Result Value Ref Range   Acetaminophen (Tylenol), Serum <10 (L) 10 - 30 ug/mL    Comment: (NOTE) Therapeutic concentrations vary  significantly. A range of 10-30 ug/mL  may be an effective concentration for many patients. However, some  are best treated at concentrations outside of this range. Acetaminophen concentrations >150 ug/mL at 4 hours after ingestion  and >50 ug/mL at 12 hours after ingestion are often associated with  toxic reactions.  Performed at [facility], [address] [place], [place] [address]   cbc     Status: Abnormal   Collection Time: 03/31/20  4:34 PM  Result Value Ref Range   WBC 8.6 4.0 - 10.5 K/uL   RBC 3.83 (L) 3.87 - 5.11 MIL/uL   Hemoglobin 11.1 (L) 12.0 - 15.0 g/dL   HCT 36.2 36 - 46 %   MCV 94.5 80.0 - 100.0 fL   MCH 29.0 26.0 - 34.0 pg   MCHC 30.7 30.0 - 36.0 g/dL   RDW 14.3 11.5 - 15.5 %   Platelets 330 150 - 400 K/uL   nRBC 0.0 0.0 - 0.2 %    Comment: Performed at [facility], [address] [place], [place] [address]  Urine Drug Screen, Qualitative     Status: None   Collection Time: 03/31/20  4:34 PM  Result Value Ref Range   Tricyclic, Ur Screen NONE DETECTED NONE DETECTED   Amphetamines, Ur Screen NONE DETECTED  NONE DETECTED   MDMA (Ecstasy)Ur Screen NONE DETECTED NONE DETECTED   Cocaine Metabolite,Ur [place] NONE DETECTED NONE DETECTED   Opiate, Ur Screen NONE DETECTED NONE DETECTED   Phencyclidine (PCP) Ur S NONE DETECTED NONE DETECTED   Cannabinoid 50 Ng, Ur [place] NONE DETECTED NONE DETECTED   Barbiturates, Ur Screen NONE DETECTED NONE DETECTED   Benzodiazepine, Ur Scrn NONE DETECTED NONE DETECTED   Methadone Scn, Ur NONE DETECTED NONE DETECTED    Comment: (NOTE) Tricyclics + metabolites, urine    Cutoff 1000 ng/mL Amphetamines + metabolites, urine  Cutoff 1000 ng/mL MDMA (Ecstasy), urine              Cutoff 500 ng/mL Cocaine Metabolite, urine          Cutoff 300 ng/mL Opiate + metabolites, urine        Cutoff 300 ng/mL Phencyclidine (PCP), urine         Cutoff 25 ng/mL Cannabinoid, urine                 Cutoff 50 ng/mL Barbiturates +  metabolites, urine  Cutoff 200 ng/mL Benzodiazepine, urine              Cutoff 200 ng/mL Methadone, urine                   Cutoff 300 ng/mL  The urine drug screen provides only a preliminary, unconfirmed analytical test result and should not be used for non-medical purposes. Clinical consideration and professional judgment should be applied to any positive drug screen result due to possible interfering substances. A more specific alternate chemical method must be used in order to obtain a confirmed analytical result. Gas chromatography / mass spectrometry (GC/MS) is the preferred confirm atory method. Performed at [facility], [address] [place],  [address]   Hemoglobin A1c     Status: Abnormal   Collection Time: 03/31/20  4:34 PM  Result Value Ref Range   Hgb A1c MFr Bld 10.3 (H) 4.8 - 5.6 %    Comment: (NOTE) Pre diabetes:          5.7%-6.4%  Diabetes:              >6.4%  Glycemic control for   <7.0% adults with diabetes    Mean Plasma Glucose 248.91 mg/dL    Comment: Performed at [facility] [address], [place], [place] [address]  Glucose, capillary     Status: Abnormal   Collection Time: 03/31/20  7:50 PM  Result Value Ref Range   Glucose-Capillary 244 (H) 70 - 99 mg/dL    Comment: Glucose reference range applies only to samples taken after fasting for at least 8 hours.  SARS Coronavirus 2 by RT PCR (hospital order, performed in [facility] hospital lab) Nasopharyngeal Nasopharyngeal Swab     Status: None   Collection Time: 04/01/20  2:55 AM   Specimen: Nasopharyngeal Swab  Result Value Ref Range   SARS Coronavirus 2 NEGATIVE NEGATIVE    Comment: (NOTE) SARS-CoV-2 target nucleic acids are NOT DETECTED.  The SARS-CoV-2 RNA is generally detectable in upper and lower respiratory specimens during the acute phase of infection. The lowest concentration of SARS-CoV-2 viral copies this assay can detect is 250 copies / mL. A negative result  does not preclude SARS-CoV-2 infection and should not be used as the sole basis for treatment or other patient management decisions.  A negative result may occur with improper specimen collection / handling,  submission of specimen other than nasopharyngeal swab, presence of viral mutation(s) within the areas targeted by this assay, and inadequate number of viral copies (<250 copies / mL). A negative result must be combined with clinical observations, patient history, and epidemiological information.  Fact Sheet for Patients:   StrictlyIdeas.no  Fact Sheet for Healthcare Providers: BankingDealers.co.za  This test is not yet approved or  cleared by the [place] FDA and has been authorized for detection and/or diagnosis of SARS-CoV-2 by FDA under an Emergency Use Authorization (EUA).  This EUA will remain in effect (meaning this test can be used) for the duration of the COVID-19 declaration under Section 564(b)(1) of the Act, 21 U.S.C. section 360bbb-3(b)(1), unless the authorization is terminated or revoked sooner.  Performed at [facility], [address] [place], [place] [address]   Glucose, capillary     Status: Abnormal   Collection Time: 04/01/20  8:33 AM  Result Value Ref Range   Glucose-Capillary 247 (H) 70 - 99 mg/dL    Comment: Glucose reference range applies only to samples taken after fasting for at least 8 hours.   Comment 1 Notify RN   Basic metabolic panel     Status: Abnormal   Collection Time: 04/28/20  9:31 AM  Result Value Ref Range   Sodium 135 135 - 145 mmol/L   Potassium 4.6 3.5 - 5.1 mmol/L   Chloride 99 98 - 111 mmol/L   CO2 22 22 - 32 mmol/L   Glucose, Bld 247 (H) 70 - 99 mg/dL    Comment: Glucose reference range applies only to samples taken after fasting for at least 8 hours.   BUN 35 (H) 6 - 20 mg/dL   Creatinine, Ser 1.82 (H) 0.44 - 1.00 mg/dL   Calcium 8.8 (L) 8.9 - 10.3 mg/dL   GFR  calc non Af Amer 31 (L) >60 mL/min   GFR calc Af Amer 35 (L) >60 mL/min   Anion gap 14 5 - 15    Comment: Performed at [facility], [address] [place], [place] [address]  CBC     Status: Abnormal   Collection Time: 04/28/20  9:31 AM  Result Value Ref Range   WBC 3.3 (L) 4.0 - 10.5 K/uL   RBC 3.81 (L) 3.87 - 5.11 MIL/uL   Hemoglobin 11.0 (L) 12.0 - 15.0 g/dL   HCT 35.1 (L) 36 - 46 %   MCV 92.1 80.0 - 100.0 fL   MCH 28.9 26.0 - 34.0 pg   MCHC 31.3 30.0 - 36.0 g/dL   RDW 15.0 11.5 - 15.5 %   Platelets 167 150 - 400 K/uL   nRBC 0.0 0.0 - 0.2 %    Comment: Performed at [facility], [address] [place], [place] [address]  CBC with Differential/Platelet     Status: Abnormal   Collection Time: 04/28/20  9:31 AM  Result Value Ref Range   WBC 3.4 (L) 4.0 - 10.5 K/uL   RBC 3.76 (L) 3.87 - 5.11 MIL/uL   Hemoglobin 11.0 (L) 12.0 - 15.0 g/dL   HCT 35.3 (L) 36 - 46 %   MCV 93.9 80.0 - 100.0 fL   MCH 29.3 26.0 - 34.0 pg   MCHC 31.2 30.0 - 36.0 g/dL   RDW 15.1 11.5 - 15.5 %   Platelets 162 150 - 400 K/uL   nRBC 0.0 0.0 - 0.2 %   Neutrophils Relative % 58 %   Neutro Abs 2.0 1.7 - 7.7  K/uL   Lymphocytes Relative 31 %   Lymphs Abs 1.1 0.7 - 4.0 K/uL   Monocytes Relative 10 %   Monocytes Absolute 0.3 0.1 - 1.0 K/uL   Eosinophils Relative 0 %   Eosinophils Absolute 0.0 0.0 - 0.5 K/uL   Basophils Relative 0 %   Basophils Absolute 0.0 0.0 - 0.1 K/uL   Immature Granulocytes 1 %   Abs Immature Granulocytes 0.03 0.00 - 0.07 K/uL    Comment: Performed at [facility], [address] [place], [place] [address]  SARS Coronavirus 2 by RT PCR (hospital order, performed in [place] hospital lab) Nasopharyngeal Nasopharyngeal Swab     Status: Abnormal   Collection Time: 04/28/20  2:44 PM   Specimen: Nasopharyngeal Swab  Result Value Ref Range   SARS Coronavirus 2 POSITIVE (A) NEGATIVE    Comment: RESULT CALLED TO, READ BACK BY AND VERIFIED WITH: JADEKA  MANGRUM RN AT 4709 ON 04/28/20 SNG  (NOTE) SARS-CoV-2 target nucleic acids are DETECTED  SARS-CoV-2 RNA is generally detectable in upper respiratory specimens  during the acute phase of infection.  Positive results are indicative  of the presence of the identified virus, but do not rule out bacterial infection or co-infection with other pathogens not detected by the test.  Clinical correlation with patient history and  other diagnostic information is necessary to determine patient infection status.  The expected result is negative.  Fact Sheet for Patients:   StrictlyIdeas.no   Fact Sheet for Healthcare Providers:   BankingDealers.co.za    This test is not yet approved or cleared by the [place] FDA and  has been authorized for detection and/or diagnosis of SARS-CoV-2 by FDA under an Emergency Use Authorization (EUA).  This EUA will remain in effect (meaning  this test can be used) for the duration of  the COVID-19 declaration under Section 564(b)(1) of the Act, 21 U.S.C. section 360-bbb-3(b)(1), unless the authorization is terminated or revoked sooner.  Performed at [facility], [address] [place], [place] [address]   Urinalysis, Complete w Microscopic Nasopharyngeal Swab     Status: Abnormal   Collection Time: 04/28/20  3:01 PM  Result Value Ref Range   Color, Urine YELLOW (A) YELLOW   APPearance HAZY (A) CLEAR   Specific Gravity, Urine 1.026 1.005 - 1.030   pH 5.0 5.0 - 8.0   Glucose, UA >=500 (A) NEGATIVE mg/dL   Hgb urine dipstick SMALL (A) NEGATIVE   Bilirubin Urine NEGATIVE NEGATIVE   Ketones, ur 20 (A) NEGATIVE mg/dL   Protein, ur NEGATIVE NEGATIVE mg/dL   Nitrite NEGATIVE NEGATIVE   Leukocytes,Ua NEGATIVE NEGATIVE   RBC / HPF 0-5 0 - 5 RBC/hpf   WBC, UA 0-5 0 - 5 WBC/hpf   Bacteria, UA NONE SEEN NONE SEEN   Squamous Epithelial / LPF 0-5 0 - 5   Mucus PRESENT     Comment: Performed at [facility], [address] [place], [place] [address]  Lactic acid, plasma     Status: None   Collection Time: 04/28/20  4:14 PM  Result Value Ref Range   Lactic Acid, Venous 1.2 0.5 - 1.9 mmol/L    Comment: Performed at [facility], [address] [place], [place] [address]  Fibrinogen     Status: None   Collection Time: 04/28/20  4:14 PM  Result Value Ref Range   Fibrinogen 475 210 - 475 mg/dL    Comment: Performed at [facility], [address] [place],  [address]  Procalcitonin - Baseline     Status: None   Collection Time: 04/28/20  4:14 PM  Result Value Ref Range   Procalcitonin <0.10 ng/mL    Comment:        Interpretation: PCT (Procalcitonin) <= 0.5 ng/mL: Systemic infection (sepsis) is not likely. Local bacterial infection is possible. (NOTE)       Sepsis PCT Algorithm           Lower Respiratory Tract                                      Infection PCT Algorithm    ----------------------------     ----------------------------         PCT < 0.25 ng/mL                PCT < 0.10 ng/mL          Strongly encourage             Strongly discourage   discontinuation of antibiotics    initiation of antibiotics    ----------------------------     -----------------------------       PCT 0.25 - 0.50 ng/mL            PCT 0.10 - 0.25 ng/mL               OR       >80% decrease in PCT            Discourage initiation of                                            antibiotics      Encourage discontinuation           of antibiotics    ----------------------------     -----------------------------         PCT >= 0.50 ng/mL              PCT 0.26 - 0.50 ng/mL               AND        <80% decrease in PCT             Encourage initiation of                                             antibiotics       Encourage continuation           of antibiotics    ----------------------------     -----------------------------        PCT >= 0.50 ng/mL                   PCT > 0.50 ng/mL               AND         increase in PCT                  Strongly encourage  initiation of antibiotics    Strongly encourage escalation           of antibiotics                                     -----------------------------                                           PCT <= 0.25 ng/mL                                                 OR                                        > 80% decrease in PCT                                      Discontinue / Do not initiate                                             antibiotics  Performed at [facility], [address] [place], [place] [address]   Fibrin derivatives D-Dimer [facility] only)     Status: Abnormal   Collection Time: 04/28/20  4:14 PM  Result Value Ref Range   Fibrin derivatives D-dimer ([facility]) 653.87 (H) 0.00 - 499.00 ng/mL (FEU)    Comment: (NOTE) <> Exclusion of Venous Thromboembolism (VTE) - OUTPATIENT ONLY   (Emergency Department or [place])    0-499 ng/ml (FEU): With a low to intermediate pretest probability                      for VTE this test result excludes the diagnosis                      of VTE.   >499 ng/ml (FEU) : VTE not excluded; additional work up for VTE is                      required.  <> Testing on Inpatients and Evaluation of Disseminated Intravascular   Coagulation (DIC) Reference Range:   0-499 ng/ml (FEU) Performed at [facility], [address] [place], [place] [address]   Glucose, capillary     Status: Abnormal   Collection Time: 04/28/20 11:00 PM  Result Value Ref Range   Glucose-Capillary 273 (H) 70 - 99 mg/dL    Comment: Glucose reference range applies only to samples taken after fasting for at least 8 hours.  CBC with Differential/Platelet     Status: Abnormal   Collection Time: 04/29/20  5:53 AM  Result Value Ref Range   WBC 2.9 (L) 4.0 - 10.5 K/uL   RBC 3.39 (L) 3.87 - 5.11 MIL/uL   Hemoglobin 9.8 (L) 12.0 -  15.0 g/dL   HCT 31.8 (L) 36 - 46 %  MCV 93.8 80.0 - 100.0 fL   MCH 28.9 26.0 - 34.0 pg   MCHC 30.8 30.0 - 36.0 g/dL   RDW 15.0 11.5 - 15.5 %   Platelets 137 (L) 150 - 400 K/uL   nRBC 0.0 0.0 - 0.2 %   Neutrophils Relative % 52 %   Neutro Abs 1.5 (L) 1.7 - 7.7 K/uL   Lymphocytes Relative 39 %   Lymphs Abs 1.1 0.7 - 4.0 K/uL   Monocytes Relative 7 %   Monocytes Absolute 0.2 0.1 - 1.0 K/uL   Eosinophils Relative 0 %   Eosinophils Absolute 0.0 0.0 - 0.5 K/uL   Basophils Relative 0 %   Basophils Absolute 0.0 0.0 - 0.1 K/uL   Immature Granulocytes 2 %   Abs Immature Granulocytes 0.06 0.00 - 0.07 K/uL    Comment: Performed at [facility], [address] [place], [place] [address]  Comprehensive metabolic panel     Status: Abnormal   Collection Time: 04/29/20  5:53 AM  Result Value Ref Range   Sodium 140 135 - 145 mmol/L   Potassium 5.0 3.5 - 5.1 mmol/L   Chloride 106 98 - 111 mmol/L   CO2 18 (L) 22 - 32 mmol/L   Glucose, Bld 290 (H) 70 - 99 mg/dL    Comment: Glucose reference range applies only to samples taken after fasting for at least 8 hours.   BUN 48 (H) 6 - 20 mg/dL   Creatinine, Ser 1.80 (H) 0.44 - 1.00 mg/dL   Calcium 7.7 (L) 8.9 - 10.3 mg/dL   Total Protein 5.8 (L) 6.5 - 8.1 g/dL   Albumin 3.2 (L) 3.5 - 5.0 g/dL   AST 98 (H) 15 - 41 U/L   ALT 58 (H) 0 - 44 U/L   Alkaline Phosphatase 41 38 - 126 U/L   Total Bilirubin 2.2 (H) 0.3 - 1.2 mg/dL   GFR calc non Af Amer 31 (L) >60 mL/min   GFR calc Af Amer 36 (L) >60 mL/min   Anion gap 16 (H) 5 - 15    Comment: Performed at [facility], [address] [place], [place] [address]  C-reactive protein     Status: Abnormal   Collection Time: 04/29/20  5:53 AM  Result Value Ref Range   CRP 4.5 (H) <1.0 mg/dL    Comment: Performed at [facility], [place] [address] [place], [place] [address]  Fibrin derivatives D-Dimer [facility] only)     Status: Abnormal   Collection Time: 04/29/20  5:53 AM  Result Value  Ref Range   Fibrin derivatives D-dimer ([facility]) 608.84 (H) 0.00 - 499.00 ng/mL (FEU)    Comment: (NOTE) <> Exclusion of Venous Thromboembolism (VTE) - OUTPATIENT ONLY   (Emergency Department or [place])    0-499 ng/ml (FEU): With a low to intermediate pretest probability                      for VTE this test result excludes the diagnosis                      of VTE.   >499 ng/ml (FEU) : VTE not excluded; additional work up for VTE is                      required.  <> Testing on Inpatients and Evaluation of Disseminated Intravascular   Coagulation (DIC) Reference Range:   0-499 ng/ml (FEU) Performed  at [facility], [address] [place], [place] [address]   Ferritin     Status: Abnormal   Collection Time: 04/29/20  5:53 AM  Result Value Ref Range   Ferritin 654 (H) 11 - 307 ng/mL    Comment: Performed at [facility], [address] [place], [place] [address]  Phosphorus     Status: Abnormal   Collection Time: 04/29/20  5:53 AM  Result Value Ref Range   Phosphorus 6.6 (H) 2.5 - 4.6 mg/dL    Comment: Performed at [facility], [address] [place], [place] [address]  Magnesium     Status: Abnormal   Collection Time: 04/29/20  5:53 AM  Result Value Ref Range   Magnesium 2.6 (H) 1.7 - 2.4 mg/dL    Comment: Performed at [facility], [address] [place], [place] [address]  Glucose, capillary     Status: Abnormal   Collection Time: 04/29/20 12:00 PM  Result Value Ref Range   Glucose-Capillary 324 (H) 70 - 99 mg/dL    Comment: Glucose reference range applies only to samples taken after fasting for at least 8 hours.  Glucose, capillary     Status: Abnormal   Collection Time: 04/29/20  6:18 PM  Result Value Ref Range   Glucose-Capillary 260 (H) 70 - 99 mg/dL    Comment: Glucose reference range applies only to samples taken after fasting for at least 8 hours.  Glucose, capillary     Status: Abnormal   Collection Time: 04/29/20  8:18  PM  Result Value Ref Range   Glucose-Capillary 178 (H) 70 - 99 mg/dL    Comment: Glucose reference range applies only to samples taken after fasting for at least 8 hours.  CBC with Differential/Platelet     Status: Abnormal   Collection Time: 04/30/20  4:18 AM  Result Value Ref Range   WBC 3.1 (L) 4.0 - 10.5 K/uL   RBC 3.29 (L) 3.87 - 5.11 MIL/uL   Hemoglobin 9.6 (L) 12.0 - 15.0 g/dL   HCT 29.4 (L) 36 - 46 %   MCV 89.4 80.0 - 100.0 fL   MCH 29.2 26.0 - 34.0 pg   MCHC 32.7 30.0 - 36.0 g/dL   RDW 15.3 11.5 - 15.5 %   Platelets 151 150 - 400 K/uL   nRBC 0.0 0.0 - 0.2 %   Neutrophils Relative % 68 %   Neutro Abs 2.1 1.7 - 7.7 K/uL   Lymphocytes Relative 24 %   Lymphs Abs 0.7 0.7 - 4.0 K/uL   Monocytes Relative 6 %   Monocytes Absolute 0.2 0.1 - 1.0 K/uL   Eosinophils Relative 0 %   Eosinophils Absolute 0.0 0.0 - 0.5 K/uL   Basophils Relative 0 %   Basophils Absolute 0.0 0.0 - 0.1 K/uL   Immature Granulocytes 2 %   Abs Immature Granulocytes 0.06 0.00 - 0.07 K/uL    Comment: Performed at [facility], [address] [place], [place] [address]  Comprehensive metabolic panel     Status: Abnormal   Collection Time: 04/30/20  4:18 AM  Result Value Ref Range   Sodium 139 135 - 145 mmol/L   Potassium 4.9 3.5 - 5.1 mmol/L   Chloride 105 98 - 111 mmol/L   CO2 22 22 - 32 mmol/L   Glucose, Bld 242 (H) 70 - 99 mg/dL    Comment: Glucose reference range applies only to samples taken after fasting for at least 8 hours.  BUN 38 (H) 6 - 20 mg/dL   Creatinine, Ser 1.41 (H) 0.44 - 1.00 mg/dL   Calcium 8.1 (L) 8.9 - 10.3 mg/dL   Total Protein 5.4 (L) 6.5 - 8.1 g/dL   Albumin 3.0 (L) 3.5 - 5.0 g/dL   AST 71 (H) 15 - 41 U/L   ALT 50 (H) 0 - 44 U/L   Alkaline Phosphatase 37 (L) 38 - 126 U/L   Total Bilirubin 1.5 (H) 0.3 - 1.2 mg/dL   GFR calc non Af Amer 42 (L) >60 mL/min   GFR calc Af Amer 48 (L) >60 mL/min   Anion gap 12 5 - 15    Comment: Performed at [facility], [address] [place], [place] [address]  C-reactive protein     Status: Abnormal   Collection Time: 04/30/20  4:18 AM  Result Value Ref Range   CRP 3.9 (H) <1.0 mg/dL    Comment: Performed at [facility], [place] [address], [place], [place] [address]  Fibrin derivatives D-Dimer [facility] only)     Status: Abnormal   Collection Time: 04/30/20  4:18 AM  Result Value Ref Range   Fibrin derivatives D-dimer ([facility]) 553.24 (H) 0.00 - 499.00 ng/mL (FEU)    Comment: (NOTE) <> Exclusion of Venous Thromboembolism (VTE) - OUTPATIENT ONLY   (Emergency Department or [place])    0-499 ng/ml (FEU): With a low to intermediate pretest probability                      for VTE this test result excludes the diagnosis                      of VTE.   >499 ng/ml (FEU) : VTE not excluded; additional work up for VTE is                      required.  <> Testing on Inpatients and Evaluation of Disseminated Intravascular   Coagulation (DIC) Reference Range:   0-499 ng/ml (FEU) Performed at [facility], [address] [place], [place] [address]   Ferritin     Status: Abnormal   Collection Time: 04/30/20  4:18 AM  Result Value Ref Range   Ferritin 663 (H) 11 - 307 ng/mL    Comment: Performed at [facility], [address] [place], [place] [address]  Phosphorus     Status: None   Collection Time: 04/30/20  4:18 AM  Result Value Ref Range   Phosphorus 3.8 2.5 - 4.6 mg/dL    Comment: Performed at [facility], [address] [place], [place] [address]  Magnesium     Status: Abnormal   Collection Time: 04/30/20  4:18 AM  Result Value Ref Range   Magnesium 2.8 (H) 1.7 - 2.4 mg/dL    Comment: Performed at [facility], [address] [place], [place] [address]  Glucose, capillary     Status: Abnormal   Collection Time: 04/30/20  8:03 AM  Result Value Ref Range   Glucose-Capillary 209 (H) 70 - 99 mg/dL    Comment: Glucose reference range applies only to samples taken  after fasting for at least 8 hours.  Glucose, capillary     Status: Abnormal   Collection Time: 04/30/20 11:47 AM  Result Value Ref Range   Glucose-Capillary 290 (H) 70 - 99 mg/dL    Comment: Glucose reference range applies only to samples taken after  fasting for at least 8 hours.  Glucose, capillary     Status: Abnormal   Collection Time: 04/30/20  4:49 PM  Result Value Ref Range   Glucose-Capillary 230 (H) 70 - 99 mg/dL    Comment: Glucose reference range applies only to samples taken after fasting for at least 8 hours.  Glucose, capillary     Status: Abnormal   Collection Time: 04/30/20  8:56 PM  Result Value Ref Range   Glucose-Capillary 238 (H) 70 - 99 mg/dL    Comment: Glucose reference range applies only to samples taken after fasting for at least 8 hours.  CBC with Differential/Platelet     Status: Abnormal   Collection Time: 05/01/20  4:38 AM  Result Value Ref Range   WBC 3.4 (L) 4.0 - 10.5 K/uL   RBC 3.46 (L) 3.87 - 5.11 MIL/uL   Hemoglobin 10.1 (L) 12.0 - 15.0 g/dL   HCT 30.9 (L) 36 - 46 %   MCV 89.3 80.0 - 100.0 fL   MCH 29.2 26.0 - 34.0 pg   MCHC 32.7 30.0 - 36.0 g/dL   RDW 15.4 11.5 - 15.5 %   Platelets 165 150 - 400 K/uL   nRBC 0.0 0.0 - 0.2 %   Neutrophils Relative % 66 %   Neutro Abs 2.2 1.7 - 7.7 K/uL   Lymphocytes Relative 24 %   Lymphs Abs 0.8 0.7 - 4.0 K/uL   Monocytes Relative 6 %   Monocytes Absolute 0.2 0.1 - 1.0 K/uL   Eosinophils Relative 0 %   Eosinophils Absolute 0.0 0.0 - 0.5 K/uL   Basophils Relative 0 %   Basophils Absolute 0.0 0.0 - 0.1 K/uL   Immature Granulocytes 4 %   Abs Immature Granulocytes 0.14 (H) 0.00 - 0.07 K/uL    Comment: Performed at [facility], [address] [place], [place] [address]  Comprehensive metabolic panel     Status: Abnormal   Collection Time: 05/01/20  4:38 AM  Result Value Ref Range   Sodium 140 135 - 145 mmol/L   Potassium 5.0 3.5 - 5.1 mmol/L   Chloride 104 98 - 111 mmol/L   CO2 30 22 - 32  mmol/L   Glucose, Bld 265 (H) 70 - 99 mg/dL    Comment: Glucose reference range applies only to samples taken after fasting for at least 8 hours.   BUN 29 (H) 6 - 20 mg/dL   Creatinine, Ser 1.00 0.44 - 1.00 mg/dL   Calcium 8.5 (L) 8.9 - 10.3 mg/dL   Total Protein 5.6 (L) 6.5 - 8.1 g/dL   Albumin 3.0 (L) 3.5 - 5.0 g/dL   AST 57 (H) 15 - 41 U/L   ALT 44 0 - 44 U/L   Alkaline Phosphatase 36 (L) 38 - 126 U/L   Total Bilirubin 0.9 0.3 - 1.2 mg/dL   GFR calc non Af Amer >60 >60 mL/min   GFR calc Af Amer >60 >60 mL/min   Anion gap 6 5 - 15    Comment: Performed at [facility], [address] [place], [place] [address]  C-reactive protein     Status: Abnormal   Collection Time: 05/01/20  4:38 AM  Result Value Ref Range   CRP 3.4 (H) <1.0 mg/dL    Comment: Performed at [facility] [address], [place], [place] [address]  Fibrin derivatives D-Dimer [facility] only)     Status: Abnormal   Collection Time: 05/01/20  4:38 AM  Result  Value Ref Range   Fibrin derivatives D-dimer ([facility]) 563.28 (H) 0.00 - 499.00 ng/mL (FEU)    Comment: (NOTE) <> Exclusion of Venous Thromboembolism (VTE) - OUTPATIENT ONLY   (Emergency Department or [place])    0-499 ng/ml (FEU): With a low to intermediate pretest probability                      for VTE this test result excludes the diagnosis                      of VTE.   >499 ng/ml (FEU) : VTE not excluded; additional work up for VTE is                      required.  <> Testing on Inpatients and Evaluation of Disseminated Intravascular   Coagulation (DIC) Reference Range:   0-499 ng/ml (FEU) Performed at [facility], [address] [place], [place] [address]   Ferritin     Status: Abnormal   Collection Time: 05/01/20  4:38 AM  Result Value Ref Range   Ferritin 554 (H) 11 - 307 ng/mL    Comment: Performed at [facility], [address] [place], [place] [address]  Phosphorus     Status: None   Collection Time:  05/01/20  4:38 AM  Result Value Ref Range   Phosphorus 2.7 2.5 - 4.6 mg/dL    Comment: Performed at [facility], [address], [place], [place] [address]  Magnesium     Status: None   Collection Time: 05/01/20  4:38 AM  Result Value Ref Range   Magnesium 2.4 1.7 - 2.4 mg/dL    Comment: Performed at [facility], [address] [place],  [address]  Glucose, capillary     Status: Abnormal   Collection Time: 05/01/20  8:40 AM  Result Value Ref Range   Glucose-Capillary 227 (H) 70 - 99 mg/dL    Comment: Glucose reference range applies only to samples taken after fasting for at least 8 hours.  Glucose, capillary     Status: Abnormal   Collection Time: 05/01/20  2:16 PM  Result Value Ref Range   Glucose-Capillary 353 (H) 70 - 99 mg/dL    Comment: Glucose reference range applies only to samples taken after fasting for at least 8 hours.  Glucose, capillary     Status: Abnormal   Collection Time: 05/01/20  4:52 PM  Result Value Ref Range   Glucose-Capillary 295 (H) 70 - 99 mg/dL    Comment: Glucose reference range applies only to samples taken after fasting for at least 8 hours.  Glucose, capillary     Status: Abnormal   Collection Time: 05/01/20  9:30 PM  Result Value Ref Range   Glucose-Capillary 191 (H) 70 - 99 mg/dL    Comment: Glucose reference range applies only to samples taken after fasting for at least 8 hours.  CBC with Differential/Platelet     Status: Abnormal   Collection Time: 05/02/20  5:17 AM  Result Value Ref Range   WBC 4.0 4.0 - 10.5 K/uL   RBC 3.93 3.87 - 5.11 MIL/uL   Hemoglobin 11.3 (L) 12.0 - 15.0 g/dL   HCT 34.8 (L) 36 - 46 %   MCV 88.5 80.0 - 100.0 fL   MCH 28.8 26.0 - 34.0 pg   MCHC 32.5 30.0 - 36.0 g/dL   RDW 15.0 11.5 - 15.5 %  Platelets 194 150 - 400 K/uL   nRBC 0.0 0.0 - 0.2 %   Neutrophils Relative % 61 %   Neutro Abs 2.4 1.7 - 7.7 K/uL   Lymphocytes Relative 28 %   Lymphs Abs 1.1 0.7 - 4.0 K/uL   Monocytes Relative 7  %   Monocytes Absolute 0.3 0.1 - 1.0 K/uL   Eosinophils Relative 0 %   Eosinophils Absolute 0.0 0.0 - 0.5 K/uL   Basophils Relative 0 %   Basophils Absolute 0.0 0.0 - 0.1 K/uL   Immature Granulocytes 4 %   Abs Immature Granulocytes 0.17 (H) 0.00 - 0.07 K/uL    Comment: Performed at [facility], [address] [place], [place] [address]  Comprehensive metabolic panel     Status: Abnormal   Collection Time: 05/02/20  5:17 AM  Result Value Ref Range   Sodium 139 135 - 145 mmol/L   Potassium 4.7 3.5 - 5.1 mmol/L   Chloride 99 98 - 111 mmol/L   CO2 29 22 - 32 mmol/L   Glucose, Bld 258 (H) 70 - 99 mg/dL    Comment: Glucose reference range applies only to samples taken after fasting for at least 8 hours.   BUN 27 (H) 6 - 20 mg/dL   Creatinine, Ser 1.01 (H) 0.44 - 1.00 mg/dL   Calcium 9.6 8.9 - 10.3 mg/dL   Total Protein 6.6 6.5 - 8.1 g/dL   Albumin 3.5 3.5 - 5.0 g/dL   AST 60 (H) 15 - 41 U/L   ALT 47 (H) 0 - 44 U/L   Alkaline Phosphatase 46 38 - 126 U/L   Total Bilirubin 0.9 0.3 - 1.2 mg/dL   GFR calc non Af Amer >60 >60 mL/min   GFR calc Af Amer >60 >60 mL/min   Anion gap 11 5 - 15    Comment: Performed at [facility], [address] [place], [place] [address]  C-reactive protein     Status: Abnormal   Collection Time: 05/02/20  5:17 AM  Result Value Ref Range   CRP 2.2 (H) <1.0 mg/dL    Comment: Performed at [facility] [address] [place], [place] [address]  Fibrin derivatives D-Dimer [facility] only)     Status: Abnormal   Collection Time: 05/02/20  5:17 AM  Result Value Ref Range   Fibrin derivatives D-dimer ([facility]) 666.44 (H) 0.00 - 499.00 ng/mL (FEU)    Comment: (NOTE) <> Exclusion of Venous Thromboembolism (VTE) - OUTPATIENT ONLY   (Emergency Department or [place])    0-499 ng/ml (FEU): With a low to intermediate pretest probability                      for VTE this test result excludes the diagnosis                      of VTE.   >499  ng/ml (FEU) : VTE not excluded; additional work up for VTE is                      required.  <> Testing on Inpatients and Evaluation of Disseminated Intravascular   Coagulation (DIC) Reference Range:   0-499 ng/ml (FEU) Performed at [facility], [address] [place], [place] [address]   Ferritin     Status: Abnormal   Collection Time: 05/02/20  5:17 AM  Result Value Ref Range   Ferritin 500 (H) 11 -  307 ng/mL    Comment: Performed at [facility], [address] [place], [place] [address]  Phosphorus     Status: None   Collection Time: 05/02/20  5:17 AM  Result Value Ref Range   Phosphorus 2.8 2.5 - 4.6 mg/dL    Comment: Performed at [facility], [address] [place], [place] [address]  Magnesium     Status: None   Collection Time: 05/02/20  5:17 AM  Result Value Ref Range   Magnesium 1.9 1.7 - 2.4 mg/dL    Comment: Performed at [facility], [address] [place], [place] [address]  Glucose, capillary     Status: Abnormal   Collection Time: 05/02/20  7:58 AM  Result Value Ref Range   Glucose-Capillary 293 (H) 70 - 99 mg/dL    Comment: Glucose reference range applies only to samples taken after fasting for at least 8 hours.   Comment 1 Notify RN   Glucose, capillary     Status: Abnormal   Collection Time: 05/02/20 12:24 PM  Result Value Ref Range   Glucose-Capillary 350 (H) 70 - 99 mg/dL    Comment: Glucose reference range applies only to samples taken after fasting for at least 8 hours.   Comment 1 Notify RN   Protime-INR     Status: None   Collection Time: 05/18/20  9:50 AM  Result Value Ref Range   Prothrombin Time 13.0 11.4 - 15.2 seconds   INR 1.0 0.8 - 1.2    Comment: (NOTE) INR goal varies based on device and disease states. Performed at [facility], [place] [address] [place], [place] [address]   APTT     Status: None   Collection Time: 05/18/20  9:50 AM  Result Value Ref Range   aPTT 24 24 - 36 seconds     Comment: Performed at [place] [address], [place], [place] [address]  CBC     Status: Abnormal   Collection Time: 05/18/20  9:50 AM  Result Value Ref Range   WBC 5.9 4.0 - 10.5 K/uL   RBC 3.71 (L) 3.87 - 5.11 MIL/uL   Hemoglobin 10.3 (L) 12.0 - 15.0 g/dL   HCT 34.5 (L) 36 - 46 %   MCV 93.0 80.0 - 100.0 fL   MCH 27.8 26.0 - 34.0 pg   MCHC 29.9 (L) 30.0 - 36.0 g/dL   RDW 15.6 (H) 11.5 - 15.5 %   Platelets 203 150 - 400 K/uL   nRBC 0.0 0.0 - 0.2 %    Comment: Performed at [facility], [place] [address] [place], [place] [address]  Differential     Status: None   Collection Time: 05/18/20  9:50 AM  Result Value Ref Range   Neutrophils Relative % 58 %   Neutro Abs 3.4 1.7 - 7.7 K/uL   Lymphocytes Relative 27 %   Lymphs Abs 1.6 0.7 - 4.0 K/uL   Monocytes Relative 13 %   Monocytes Absolute 0.8 0.1 - 1.0 K/uL   Eosinophils Relative 1 %   Eosinophils Absolute 0.1 0.0 - 0.5 K/uL   Basophils Relative 0 %   Basophils Absolute 0.0 0.0 - 0.1 K/uL   Immature Granulocytes 1 %   Abs Immature Granulocytes 0.06 0.00 - 0.07 K/uL    Comment: Performed at [place] [address], [place], [place] [address]  Comprehensive metabolic panel     Status: Abnormal   Collection Time:  05/18/20  9:50 AM  Result Value Ref Range   Sodium 139 135 - 145 mmol/L   Potassium 3.9 3.5 - 5.1 mmol/L   Chloride 105 98 - 111 mmol/L   CO2 23 22 - 32 mmol/L   Glucose, Bld 157 (H) 70 - 99 mg/dL    Comment: Glucose reference range applies only to samples taken after fasting for at least 8 hours.   BUN 54 (H) 6 - 20 mg/dL   Creatinine, Ser 1.71 (H) 0.44 - 1.00 mg/dL   Calcium 9.1 8.9 - 10.3 mg/dL   Total Protein 6.1 (L) 6.5 - 8.1 g/dL   Albumin 3.2 (L) 3.5 - 5.0 g/dL   AST 62 (H) 15 - 41 U/L   ALT 67 (H) 0 - 44 U/L   Alkaline Phosphatase 46 38 - 126 U/L   Total Bilirubin 0.5 0.3 - 1.2 mg/dL   GFR calc non Af Amer 33 (L) >60 mL/min   GFR calc Af Amer 38 (L) >60 mL/min   Anion gap 11 5  - 15    Comment: Performed at [place] [address], [place], [place] [address]  I-Stat beta hCG blood, ED     Status: None   Collection Time: 05/18/20  9:56 AM  Result Value Ref Range   I-stat hCG, quantitative <5.0 <5 mIU/mL   Comment 3            Comment:   GEST. AGE      CONC.  (mIU/mL)   <=1 WEEK        5 - 50     2 WEEKS       50 - 500     3 WEEKS       100 - 10,000     4 WEEKS     1,000 - 30,000        FEMALE AND NON-PREGNANT FEMALE:     LESS THAN 5 mIU/mL   I-stat chem 8, ED     Status: Abnormal   Collection Time: 05/18/20  9:58 AM  Result Value Ref Range   Sodium 140 135 - 145 mmol/L   Potassium 4.7 3.5 - 5.1 mmol/L   Chloride 106 98 - 111 mmol/L   BUN 71 (H) 6 - 20 mg/dL   Creatinine, Ser 1.80 (H) 0.44 - 1.00 mg/dL   Glucose, Bld 156 (H) 70 - 99 mg/dL    Comment: Glucose reference range applies only to samples taken after fasting for at least 8 hours.   Calcium, Ion 1.12 (L) 1.15 - 1.40 mmol/L   TCO2 25 22 - 32 mmol/L   Hemoglobin 10.5 (L) 12.0 - 15.0 g/dL   HCT 31.0 (L) 36 - 46 %  CBG monitoring, ED     Status: Abnormal   Collection Time: 05/18/20  1:14 PM  Result Value Ref Range   Glucose-Capillary 68 (L) 70 - 99 mg/dL    Comment: Glucose reference range applies only to samples taken after fasting for at least 8 hours.  CBG monitoring, ED     Status: Abnormal   Collection Time: 05/18/20  1:43 PM  Result Value Ref Range   Glucose-Capillary 60 (L) 70 - 99 mg/dL    Comment: Glucose reference range applies only to samples taken after fasting for at least 8 hours.  CBG monitoring, ED     Status: Abnormal   Collection Time: 05/18/20  1:45 PM  Result Value Ref Range   Glucose-Capillary 68 (L) 70 -  99 mg/dL    Comment: Glucose reference range applies only to samples taken after fasting for at least 8 hours.  CBG monitoring, ED     Status: None   Collection Time: 05/18/20  2:46 PM  Result Value Ref Range   Glucose-Capillary 93 70 - 99 mg/dL    Comment:  Glucose reference range applies only to samples taken after fasting for at least 8 hours.  ECHOCARDIOGRAM COMPLETE     Status: None   Collection Time: 06/08/20  9:58 AM  Result Value Ref Range   AR max vel 2.40 cm2   AV Peak grad 6.2 mmHg   Ao pk vel 1.24 m/s   S' Lateral 2.90 cm   Area-P 1/2 4.83 cm2   AV Area VTI 2.65 cm2   AV Mean grad 3.0 mmHg   Single Plane A4C EF 57.6 %   Single Plane A2C EF 60.6 %   Calc EF 58.2 %   AV Area mean vel 2.25 cm2      PHQ2/9: Depression screen [facility] 2/9 06/15/2020 01/26/2020 12/22/2019 11/05/2019 09/25/2019  Decreased Interest 2 0 0 0 0  Down, Depressed, Hopeless 2 0 0 0 0  PHQ - 2 Score 4 0 0 0 0  Altered sleeping 2 0 0 0 -  Tired, decreased energy 2 0 0 2 -  Change in appetite 2 0 0 0 -  Feeling bad or failure about yourself  0 0 0 0 -  Trouble concentrating 3 0 0 0 -  Moving slowly or fidgety/restless 1 0 0 0 -  Suicidal thoughts 0 0 0 0 -  PHQ-9 Score 14 0 0 2 -  Difficult doing work/chores Not difficult at all - - Somewhat difficult -  Some recent data might be hidden    phq 9 is positive - sees psychiatrist    Fall Risk: Fall Risk  06/15/2020 01/26/2020 12/22/2019 11/05/2019 09/25/2019  Falls in the past year? 1 0 0 1 0  Comment - - - - -  Number falls in past yr: 1 0 0 1 0  Comment - - - - -  Injury with Fall? 0 0 0 0 0  Comment - - - - -  Risk for fall due to : Impaired balance/gait;Impaired mobility;History of fall(s) - - - No Fall Risks  Follow up - - - - Falls prevention discussed      Assessment & Plan  1. Abscess, scalp  - doxycycline (VIBRA-TABS) 100 MG tablet; Take 1 tablet (100 mg total) by mouth 2 (two) times daily.  Dispense: 20 tablet; Refill: 0  2. Need for immunization against influenza  - Flu Vaccine QUAD 36+ mos IM  3. Infection of left earlobe  - doxycycline (VIBRA-TABS) 100 MG tablet; Take 1 tablet (100 mg total) by mouth 2 (two) times daily.  Dispense: 20 tablet; Refill: 0  4. Hordeolum internum of  left lower eyelid  Warm compresses  5. Yeast vaginitis  - fluconazole (DIFLUCAN) 150 MG tablet; Take 1 tablet (150 mg total) by mouth every other day.  Dispense: 3 tablet; Refill: 0

## 2020-06-23 ENCOUNTER — Encounter: Payer: Self-pay | Admitting: Family Medicine

## 2020-06-23 ENCOUNTER — Encounter: Payer: Self-pay | Admitting: Emergency Medicine

## 2020-06-25 ENCOUNTER — Other Ambulatory Visit: Payer: Self-pay

## 2020-06-25 ENCOUNTER — Other Ambulatory Visit
Admission: RE | Admit: 2020-06-25 | Discharge: 2020-06-25 | Disposition: A | Discharge status: Home / Self Care | Payer: Medicare HMO | Attending: Physician Assistant | Admitting: Physician Assistant

## 2020-06-25 ENCOUNTER — Encounter: Payer: Self-pay | Admitting: Physician Assistant

## 2020-06-25 ENCOUNTER — Ambulatory Visit (INDEPENDENT_AMBULATORY_CARE_PROVIDER_SITE_OTHER): Payer: Medicare HMO | Admitting: Physician Assistant

## 2020-06-25 VITALS — BP 120/70 | HR 83 | Ht 59.0 in | Wt 186.5 lb

## 2020-06-25 DIAGNOSIS — E782 Mixed hyperlipidemia: Secondary | ICD-10-CM | POA: Diagnosis not present

## 2020-06-25 DIAGNOSIS — N183 Chronic kidney disease, stage 3 unspecified: Secondary | ICD-10-CM

## 2020-06-25 DIAGNOSIS — I5032 Chronic diastolic (congestive) heart failure: Secondary | ICD-10-CM | POA: Diagnosis present

## 2020-06-25 DIAGNOSIS — R079 Chest pain, unspecified: Secondary | ICD-10-CM

## 2020-06-25 DIAGNOSIS — R5383 Other fatigue: Secondary | ICD-10-CM

## 2020-06-25 DIAGNOSIS — G4733 Obstructive sleep apnea (adult) (pediatric): Secondary | ICD-10-CM

## 2020-06-25 DIAGNOSIS — I10 Essential (primary) hypertension: Secondary | ICD-10-CM | POA: Diagnosis not present

## 2020-06-25 DIAGNOSIS — R0989 Other specified symptoms and signs involving the circulatory and respiratory systems: Secondary | ICD-10-CM

## 2020-06-25 DIAGNOSIS — R002 Palpitations: Secondary | ICD-10-CM

## 2020-06-25 DIAGNOSIS — E785 Hyperlipidemia, unspecified: Secondary | ICD-10-CM

## 2020-06-25 DIAGNOSIS — Z8616 Personal history of COVID-19: Secondary | ICD-10-CM

## 2020-06-25 DIAGNOSIS — I422 Other hypertrophic cardiomyopathy: Secondary | ICD-10-CM

## 2020-06-25 DIAGNOSIS — R000 Tachycardia, unspecified: Secondary | ICD-10-CM

## 2020-06-25 LAB — BASIC METABOLIC PANEL
Anion gap: 10 (ref 5–15)
BUN: 34 mg/dL — ABNORMAL HIGH (ref 6–20)
CO2: 26 mmol/L (ref 22–32)
Calcium: 9.5 mg/dL (ref 8.9–10.3)
Chloride: 105 mmol/L (ref 98–111)
Creatinine, Ser: 1.1 mg/dL — ABNORMAL HIGH (ref 0.44–1.00)
GFR, Estimated: 59 mL/min — ABNORMAL LOW (ref >60)
Glucose, Bld: 122 mg/dL — ABNORMAL HIGH (ref 70–99)
Potassium: 4 mmol/L (ref 3.5–5.1)
Sodium: 141 mmol/L (ref 135–145)

## 2020-06-25 NOTE — Patient Instructions (Signed)
Medication Instructions:  - Your physician recommends that you continue on your current medications as directed. Please refer to the Current Medication list given to you today.  *If you need a refill on your cardiac medications before your next appointment, please call your pharmacy*   Lab Work: - Your physician recommends that you have lab work today: BMP  If you have labs (blood work) drawn today and your tests are completely normal, you will receive your results only by: Marland Kitchen MyChart Message (if you have MyChart) OR . A paper copy in the mail If you have any lab test that is abnormal or we need to change your treatment, we will call you to review the results.   Testing/Procedures:  1) Cardiac CT pending the results of your lab work today  2) Your physician has requested that you have a carotid duplex. This test is an ultrasound of the carotid arteries in your neck. It looks at blood flow through these arteries that supply the brain with blood. Allow one hour for this exam. There are no restrictions or special instructions.   Follow-Up: At [facility], you and your health needs are our priority.  As part of our continuing mission to provide you with exceptional heart care, we have created designated Provider Care Teams.  These Care Teams include your primary Cardiologist (physician) and Advanced Practice Providers (APPs -  Physician Assistants and Nurse Practitioners) who all work together to provide you with the care you need, when you need it.  We recommend signing up for the patient portal called "MyChart".  Sign up information is provided on this After Visit Summary.  MyChart is used to connect with patients for Virtual Visits (Telemedicine).  Patients are able to view lab/test results, encounter notes, upcoming appointments, etc.  Non-urgent messages can be sent to your provider as well.   To learn more about what you can do with MyChart, go to [email or web address].    Your next  appointment:   2 month(s)  The format for your next appointment:   In Person  Provider:   You may see Ida Rogue, MD or one of the following Advanced Practice Providers on your designated Care Team:    Murray Hodgkins, NP  Christell Faith, PA-C  Marrianne Mood, PA-C  Cadence Kathlen Mody, [place]    Other Instructions  1) Nephrology appointment: Monday 07/26/20 @ 1:20 pm- Dr. Docia [facility] [place] office [phone] to cancel/ reschedule your appointment    Cardiac CT Angiogram A cardiac CT angiogram is a procedure to look at the heart and the area around the heart. It may be done to help find the cause of chest pains or other symptoms of heart disease. During this procedure, a substance called contrast dye is injected into the blood vessels in the area to be checked. A large X-ray machine, called a CT scanner, then takes detailed pictures of the heart and the surrounding area. The procedure is also sometimes called a coronary CT angiogram, coronary artery scanning, or CTA. A cardiac CT angiogram allows the health care provider to see how well blood is flowing to and from the heart. The health care provider will be able to see if there are any problems, such as:  Blockage or narrowing of the coronary arteries in the heart.  Fluid around the heart.  Signs of weakness or disease in the muscles, valves, and tissues of the heart. Tell a health care provider about:  Any allergies you have. This is  especially important if you have had a previous allergic reaction to contrast dye.  All medicines you are taking, including vitamins, herbs, eye drops, creams, and over-the-counter medicines.  Any blood disorders you have.  Any surgeries you have had.  Any medical conditions you have.  Whether you are pregnant or may be pregnant.  Any anxiety disorders, chronic pain, or other conditions you have that may increase your stress or prevent you from lying still. What are  the risks? Generally, this is a safe procedure. However, problems may occur, including:  Bleeding.  Infection.  Allergic reactions to medicines or dyes.  Damage to other structures or organs.  Kidney damage from the contrast dye that is used.  Increased risk of cancer from radiation exposure. This risk is low. Talk with your health care provider about: ? The risks and benefits of testing. ? How you can receive the lowest dose of radiation. What happens before the procedure?  Wear comfortable clothing and remove any jewelry, glasses, dentures, and hearing aids.  Follow instructions from your health care provider about eating and drinking. This may include: ? For 12 hours before the procedure -- avoid caffeine. This includes tea, coffee, soda, energy drinks, and diet pills. Drink plenty of water or other fluids that do not have caffeine in them. Being well hydrated can prevent complications. ? For 4-6 hours before the procedure -- stop eating and drinking. The contrast dye can cause nausea, but this is less likely if your stomach is empty.  Ask your health care provider about changing or stopping your regular medicines. This is especially important if you are taking diabetes medicines, blood thinners, or medicines to treat problems with erections (erectile dysfunction). What happens during the procedure?   Hair on your chest may need to be removed so that small sticky patches called electrodes can be placed on your chest. These will transmit information that helps to monitor your heart during the procedure.  An IV will be inserted into one of your veins.  You might be given a medicine to control your heart rate during the procedure. This will help to ensure that good images are obtained.  You will be asked to lie on an exam table. This table will slide in and out of the CT machine during the procedure.  Contrast dye will be injected into the IV. You might feel warm, or you may get a  metallic taste in your mouth.  You will be given a medicine called nitroglycerin. This will relax or dilate the arteries in your heart.  The table that you are lying on will move into the CT machine tunnel for the scan.  The person running the machine will give you instructions while the scans are being done. You may be asked to: ? Keep your arms above your head. ? Hold your breath. ? Stay very still, even if the table is moving.  When the scanning is complete, you will be moved out of the machine.  The IV will be removed. The procedure may vary among health care providers and hospitals. What can I expect after the procedure? After your procedure, it is common to have:  A metallic taste in your mouth from the contrast dye.  A feeling of warmth.  A headache from the nitroglycerin. Follow these instructions at home:  Take over-the-counter and prescription medicines only as told by your health care provider.  If you are told, drink enough fluid to keep your urine pale yellow. This will  help to flush the contrast dye out of your body.  Most people can return to their normal activities right after the procedure. Ask your health care provider what activities are safe for you.  It is up to you to get the results of your procedure. Ask your health care provider, or the department that is doing the procedure, when your results will be ready.  Keep all follow-up visits as told by your health care provider. This is important. Contact a health care provider if:  You have any symptoms of allergy to the contrast dye. These include: ? Shortness of breath. ? Rash or hives. ? A racing heartbeat. Summary  A cardiac CT angiogram is a procedure to look at the heart and the area around the heart. It may be done to help find the cause of chest pains or other symptoms of heart disease.  During this procedure, a large X-ray machine, called a CT scanner, takes detailed pictures of the heart and  the surrounding area after a contrast dye has been injected into blood vessels in the area.  Ask your health care provider about changing or stopping your regular medicines before the procedure. This is especially important if you are taking diabetes medicines, blood thinners, or medicines to treat erectile dysfunction.  If you are told, drink enough fluid to keep your urine pale yellow. This will help to flush the contrast dye out of your body. This information is not intended to replace advice given to you by your health care provider. Make sure you discuss any questions you have with your health care provider. Document Revised: 04/16/2019 Document Reviewed: 04/16/2019 Elsevier Patient Education  [place].

## 2020-06-25 NOTE — Progress Notes (Signed)
Office Visit    Patient Name: [name] Date of Encounter: 06/25/2020  Primary Care Provider:  Steele Sizer, MD Primary Cardiologist:  Ida Rogue, MD  Chief Complaint    Chief Complaint  Patient presents with  . OTHER    2 wk f/u echo. Meds reviewed verbally with pt.    54 yo female h/o uncontrolled DM 2, hypothyroidism, NASH, restless leg syndrome, chronic back pain, sinusitis, depression, recent COVID-19 infection, and who presents today for 2 week follow-up of her chest pain and palpitations.  Past Medical History    Past Medical History:  Diagnosis Date  . Anxiety   . Arthritis   . Asthma   . Bipolar depression ([place])   . CHF (congestive heart failure) ([place])   . Chronic headaches    migraines have lessened  . Chronic kidney disease    ddstage 111  . COPD (chronic obstructive pulmonary disease) ([place])   . Coronary artery disease   . Depression   . Diabetic neuropathy ([place])   . Dyspnea    uses inhalers for this  . Dysrhythmia    rapid  . Fatty liver   . GERD (gastroesophageal reflux disease)   . Headache   . Heart attack ([place]) 2009  . Hyperlipidemia   . Hypertension   . Hypomagnesemia   . Hypothyroid   . Irregular heart rhythm   . Liver lesion    Favored to be benign on CT; MRI of abd w/contract in Aug 2016. Liver Biopsy-Negative, March 2016  . Morbid obesity ([place])   . OSA (obstructive sleep apnea)   . Peripheral vascular disease ([place])   . Pinched nerve    Back  . Pneumonia   . RLS (restless legs syndrome)   . Seasonal allergies   . Sleep apnea    uses oxygen at night 2L d/t cpap did not fit  . Type 2 diabetes, uncontrolled, with neuropathy ([place]) 10/15/2015   Overview:  Microalbumin 7.7 12/2010: Hgb A1c 10.2% 12/2010 and 9.7% 03/2011, eye exam 10/2009 [facility] DM clinic   . Urinary incontinence   . Vitamin D deficiency    Past Surgical History:  Procedure Laterality Date  . ABDOMINAL HYSTERECTOMY     Partial  . bladder stim  2007  . BLADDER  SURGERY     x 2. tacking of bladder and the other was the stimulator placement  . CHOLECYSTECTOMY    . COLONOSCOPY WITH PROPOFOL N/A 03/04/2019   Procedure: COLONOSCOPY WITH PROPOFOL;  Surgeon: [name], MD;  Location: [facility] ENDOSCOPY;  Service: Endoscopy;  Laterality: N/A;  . ESOPHAGOGASTRODUODENOSCOPY (EGD) WITH PROPOFOL N/A 03/04/2019   Procedure: ESOPHAGOGASTRODUODENOSCOPY (EGD) WITH PROPOFOL;  Surgeon: [name], MD;  Location: [facility] ENDOSCOPY;  Service: Endoscopy;  Laterality: N/A;  . INCISION AND DRAINAGE ABSCESS Right 04/16/2019   Procedure: INCISION AND DRAINAGE ABSCESS;  Surgeon: Benjamine Sprague, DO;  Location: [facility] ORS;  Service: General;  Laterality: Right;  . INTERSTIM-GENERATOR CHANGE N/A 12/31/2017   Procedure: INTERSTIM-GENERATOR CHANGE;  Surgeon: Bjorn Loser, MD;  Location: [facility] ORS;  Service: Urology;  Laterality: N/A;  . LIVER BIOPSY  March 2016   Negative  . LUNG BIOPSY  2015   small spot on lung  . TUBAL LIGATION    . UPPER GI ENDOSCOPY    . WRIST SURGERY Right 11/02/2019    Allergies  Allergies  Allergen Reactions  . Mirtazapine Anaphylaxis    REACTION: closes throat  . Morphine And Related     anaphylaxis  .  Sulfamethoxazole-Trimethoprim Rash    REACTION: Whelps all over  . Amoxicillin Er Nausea And Vomiting    Has patient had a PCN reaction causing immediate rash, facial/tongue/throat swelling, SOB or lightheadedness with hypotension: No Has patient had a PCN reaction causing severe rash involving mucus membranes or skin necrosis: No Has patient had a PCN reaction that required hospitalization: No Has patient had a PCN reaction occurring within the last 10 years: Yes If all of the above answers are "NO", then may proceed with Cephalosporin use.  . Codeine Itching    REACTION: itch, rash  . Hydrocodone-Acetaminophen Rash    REACTION: rash  . Keflex [Cephalexin] Rash  . Prasterone Nausea And Vomiting    History of Present Illness    [name] is a 54 y.o. female with PMH as above.     She presented to the emergency department 06/26/2017 with chest pain, described as burning central chest pain that was nonradiating and going on since earlier that morning.  Chest pain was mild to moderate in intensity, constant for the last 8 to 9 hours, and somewhat pleuritic.  It was also worse when laying flat and better sitting upright.   2018 CT chest scan was reviewed with patient at her last visit and noted to be unable to exclude mild coronary artery calcification with mid LAD otherwise no aortic atherosclerosis.  She was last seen 11/12/2019 by her primary cardiologist, Dr. Rockey Situ.  At that time, she reported she was tired and having some chest pain at rest and occasionally with exertion.  She reported staying in the house mostly and playing on her computer.  She also noted generalized fatigue and weakness with multiple falls.  She was being evaluated by endocrine for DM2 via [facility].  She reported heart racing on metoprolol.  Her husband was helping with groceries.     Echo was ordered with recommendation to consider future stress. Walker was recommended, as well as glycemic control.  Echo showed EF 55 to 60%, NRWMA.   In 04/2020 she reports testing positive for COVID-19 after having a cough, fever, and SOB. She was admitted for 4 days then discharged home. I am unable to find the positive COVID test to confirm this diagnosis, though she was admitted during that time.   04/2020 CT scan performed as below.  She was seen at [facility] ED 05/2020 for dehydration. She reported episodes of bouncing off of the walls and slurred speech. She was on 2L [place] O2 at night and had not been able to go back to her normal home O2. She was noted to be orthostatic and hypoglycemic and treated with bolus of IV fluids. Case was discussed with neuro, which though the case not consistent with stroke Lab work showed worsening uremia.   Seen 06/07/2020 with r/o ongoing/chronic  dizziness and 13 falls in the last year.  He notes unchanged SOB, DOE, atypical CP, and 3 pillow orthopnea.  SBP 110-130s.  She has needed PM O2 since her COVID-19 infection.  She notes ongoing atypical, L sided chest pain that is pleuritic and worse with position changes.  She had discontinued her BB per provider instruction with HR 95 bpm and r/o racing heart rate and palpitations since discontinuation of her beta-blocker.  She could sometimes hear her HR in her ears. In addition to restarting her beta-blocker, echo was recommended. She requested referral to nephrology, which was also provided given her kidney function.  Subsequent echo showed EF 55 to 60%, NRWMA,  moderate asymmetric left ventricular hypertrophy of the septal segment (?HOCM), indeterminate diastolic parameters.  Today, 06/25/2020, she returns to clinic and notes that she is very fatigued. She has not yet received a call from nephrology and again requests nephrology referral. She continues to hear the ocean in her ears with left-sided bruit appreciated today and referral for ultrasound. She reports constant dizziness but denies any amaurosis fugax. In addition to fatigue, she notes chest pain that occurs with exertion and lasts approximately 20 minutes and then is alleviated with rest. The chest pain does not occur with rest. She also notes that the chest pain is inconsistent with exertion. She is unable to describe the chest pain further but does not feel there are any associated symptoms. She does not recall how many pillows she is sleeping on, because she is given a 'Bubble pack" of pillows, which was unable to be clarified. She reports improvement in her racing heart rate and palpitations since restarting her beta-blocker. She denies syncope, falls, abdominal distention, early satiety, or weight gain. She feels as if she is breathing well. She denies signs or symptoms of bleeding. She reports medication compliance.  Home Medications     Prior to Admission medications   Medication Sig Start Date End Date Taking? Authorizing Provider  albuterol (VENTOLIN HFA) 108 (90 Base) MCG/ACT inhaler Inhale 2 puffs into the lungs every 6 (six) hours as needed for wheezing or shortness of breath. 08/12/19   Hubbard Hartshorn, FNP  atorvastatin (LIPITOR) 80 MG tablet Take 80 mg by mouth at bedtime.     [provider]  busPIRone (BUSPAR) 10 MG tablet Take 20 mg by mouth 2 (two) times daily.     [provider]  citalopram (CELEXA) 20 MG tablet Take 20 mg by mouth daily. 01/16/20   [provider]  dexamethasone (DECADRON) 2 MG tablet Take 2 mg by mouth 3 (three) times daily. 05/03/20   [provider]  dextromethorphan-guaiFENesin (MUCINEX DM) 30-600 MG 12hr tablet Take 1 tablet by mouth 2 (two) times daily. Patient not taking: Reported on 05/18/2020 05/02/20   Lavina Hamman, MD  empagliflozin (JARDIANCE) 25 MG TABS tablet Take 25 mg by mouth daily.    [provider]  ezetimibe (ZETIA) 10 MG tablet Take 10 mg by mouth daily.  01/05/17   [provider]  fenofibrate micronized (LOFIBRA) 134 MG capsule Take 134 mg by mouth daily.    [provider]  fluticasone (FLONASE) 50 MCG/ACT nasal spray Place 2 sprays into both nostrils daily. 01/26/20   Steele Sizer, MD  gabapentin (NEURONTIN) 300 MG capsule Take 1-2 capsules (300-600 mg total) by mouth 3 (three) times daily. Take 1 capsule each morning, 1 capsule each afternoon, and 2 capsules at bedtime. 03/17/20   Steele Sizer, MD  insulin aspart (NOVOLOG FLEXPEN) 100 UNIT/ML FlexPen Inject 3 Units subcutaneously. Sliding scale.    [provider]  insulin regular human CONCENTRATED (HUMULIN R) 500 UNIT/ML injection Inject 70 Units into the skin 3 (three) times daily with meals.    [provider]  lamoTRIgine (LAMICTAL) 200 MG tablet Take 200 mg by mouth 2 (two) times daily.     [provider]  levothyroxine  (SYNTHROID) 50 MCG tablet TAKE ONE TABLET DAILY 3 DAYS A WEEK, THEN TAKE ONE AND ONE-HALF TABLETS FOUR DAYS A WEEK Patient taking differently: Take 50 mcg by mouth See admin instructions. Take one tablet daily for 3 days a week, then 1 1/2 tablets 4  days a week 05/11/20   Steele Sizer, MD  lisinopril (ZESTRIL) 2.5 MG tablet TAKE 1 TABLET BY MOUTH EVERY DAY Patient taking differently: Take 2.5 mg by mouth daily.  01/06/20   Steele Sizer, MD  metoprolol tartrate (LOPRESSOR) 25 MG tablet Take 37.5 mg by mouth daily.    [provider]  ondansetron (ZOFRAN) 4 MG tablet TAKE 1 TABLET BY MOUTH EVERY 8 HOURS AS NEEDED FOR NAUSEA OR VOMITING Patient taking differently: Take 4 mg by mouth every 8 (eight) hours as needed for nausea or vomiting.  09/15/19   [name], MD  pantoprazole (PROTONIX) 40 MG tablet Take 1 tablet (40 mg total) by mouth daily. 08/20/19   [name], MD  polyethylene glycol (MIRALAX) 17 g packet Take 17 g by mouth daily. Patient taking differently: Take 17 g by mouth daily as needed for mild constipation.  10/27/19   Nance Pear, MD  QUEtiapine (SEROQUEL) 200 MG tablet Take 200 mg by mouth at bedtime. Along with 377m dose for a total of 5079m   [provider]  vitamin C (ASCORBIC ACID) 250 MG tablet Take 1 tablet (250 mg total) by mouth daily. 05/02/20   PaLavina HammanMD  Zinc 220 (50 Zn) MG CAPS Take 1 tablet by mouth daily. 05/02/20   PaLavina HammanMD    Review of Systems    She reports chest pain, dizziness, fatigue, and hearing the ocean in her ears. She denies palpitations, dyspnea, pnd, orthopnea, n, v, syncope, edema, weight gain, or early satiety. All other systems reviewed and are otherwise negative except as noted above.  Physical Exam    VS:  BP 120/70 (BP Location: Left Arm, Patient Position: Sitting, Cuff Size: Normal)   Pulse 83   Ht 4' 11"  (1.499 m)   Wt 186 lb 8 oz (84.6 kg)   SpO2 98%   BMI 37.67 kg/m  , BMI Body mass index  is 37.67 kg/m. GEN: Well nourished, well developed, in no acute distress. HEENT: normal. Neck: Supple, no JVD. L sided carotid bruit. No R bruit or masses. Cardiac: RRR, 1/6 systolic murmur. No rubs, or gallops. No clubbing, cyanosis. Mild bilateral edema.  Radials/DP/PT 2+ and equal bilaterally.  Respiratory:  Respirations regular and unlabored, clear to auscultation bilaterally. GI: Soft, nontender, nondistended, BS + x 4. MS: no deformity or atrophy. Skin: warm and dry, no rash. Neuro:  Strength and sensation are intact. Psych: Normal affect.  Accessory Clinical Findings    ECG personally reviewed by me today - NSR, 83bpm, LVH, poor R wave progression in the precordial leads, poor R wave progression in the inferior leads- no acute changes.  VITALS Reviewed today   Temp Readings from Last 3 Encounters:  06/15/20 98 F (36.7 C) (Oral)  05/18/20 98.2 F (36.8 C) (Oral)  05/02/20 98.4 F (36.9 C) (Oral)   BP Readings from Last 3 Encounters:  06/25/20 120/70  06/15/20 122/74  06/07/20 140/80   Pulse Readings from Last 3 Encounters:  06/25/20 83  06/15/20 89  06/07/20 95    Wt Readings from Last 3 Encounters:  06/25/20 186 lb 8 oz (84.6 kg)  06/15/20 187 lb 9.6 oz (85.1 kg)  06/07/20 195 lb 4 oz (88.6 kg)     LABS  reviewed today   Lab Results  Component Value Date   WBC 5.9 05/18/2020   HGB 10.5 (L) 05/18/2020   HCT 31.0 (L) 05/18/2020   MCV 93.0 05/18/2020   PLT 203  05/18/2020   Lab Results  Component Value Date   CREATININE 1.80 (H) 05/18/2020   BUN 71 (H) 05/18/2020   NA 140 05/18/2020   K 4.7 05/18/2020   CL 106 05/18/2020   CO2 23 05/18/2020   Lab Results  Component Value Date   ALT 67 (H) 05/18/2020   AST 62 (H) 05/18/2020   ALKPHOS 46 05/18/2020   BILITOT 0.5 05/18/2020   Lab Results  Component Value Date   CHOL 196 09/28/2016   HDL 33 (L) 09/28/2016   LDLCALC NOT CALC 09/28/2016   LDLDIRECT 65.4 06/08/2008   TRIG 856 (H) 09/28/2016    CHOLHDL 5.9 (H) 09/28/2016    Lab Results  Component Value Date   HGBA1C 9.6 06/12/2020   Lab Results  Component Value Date   TSH 1.17 02/24/2019     STUDIES/PROCEDURES reviewed today   Echo 06/08/20 1. Left ventricular ejection fraction, by estimation, is 55 to 60%. The  left ventricle has normal function. The left ventricle has no regional  wall motion abnormalities. There is moderate asymmetric left ventricular  hypertrophy of the septal segment.  Left ventricular diastolic parameters are indeterminate.  2. Right ventricular systolic function is low normal. The right  ventricular size is normal. There is normal pulmonary artery systolic  pressure.  3. The mitral valve is normal in structure. Trivial mitral valve  regurgitation.  4. The aortic valve was not well visualized. Aortic valve regurgitation  is not visualized. No aortic stenosis is present.  5. The inferior vena cava is normal in size with greater than 50%  respiratory variability, suggesting right atrial pressure of 3 mmHg.   Echo 12/04/19 1. Left ventricular ejection fraction, by estimation, is 55 to 60%. The  left ventricle has normal function. The left ventricle has no regional  wall motion abnormalities. Left ventricular diastolic parameters were  normal.  2. Right ventricular systolic function is normal. The right ventricular  size is normal. There is normal pulmonary artery systolic pressure.  3. The mitral valve is normal in structure. No evidence of mitral valve  regurgitation. No evidence of mitral stenosis.  4. The aortic valve is normal in structure. Aortic valve regurgitation is  not visualized. No aortic stenosis is present.  5. The inferior vena cava is normal in size with greater than 50%  respiratory variability, suggesting right atrial pressure of 3 mmHg.   CTA 04/28/2020 IMPRESSION: 1. No pulmonary embolism.  Study is mildly limited by bolus timing. 2. Bilateral ground-glass  opacities throughout the chest with peripheral predominance. Findings are consistent with the provided history of COVID-19 infection. 3. Mildly enlarged mediastinal and hilar lymph nodes, likely reactive. 4. Hepatic steatosis. 5. Aortic atherosclerosis.  Aortic Atherosclerosis (ICD10-I70.0).   Assessment & Plan    Chronic Atypical CP --CP improved with restart of beta-blocker but still present upon exertion.  Given her asymmetric hypertrophy of her LV septum as in echo above, consider transition from current beta-blocker to verapamil or diltiazem at follow-up per GDMT.  This could contribute to her CP. We will reach out to primary cardiologist.  In addition, given the patient's anxiety regarding her chest discomfort, discussed cardiac CT at length with the patient agreement to proceed as long as her renal function allows, given her poor renal function in the past and the most recent labs she brings to clinic today.  We will recheck a BMET today with further recommendations regarding cardiac CT at that time.    L sided bruit --Left-sided bruit  appreciated on exam today.  She denies any amaurosis fugax but does report constant/chronic dizziness.  Will update carotid ultrasounds today.  Recommend cholesterol control and ASA 81 mg daily.  Racing HR, Palpitations --Improved with restart of beta-blocker.  As above, consider transition from beta-blocker to verapamil or diltiazem, given her asymmetric left ventricular hypertrophy of the septal segment.  Will defer to primary cardiologist. HR control stressed, as well as hydration.  History of COVID-19 infection --Repeat echo as above and without evidence of myopericarditis or pericardial effusion.  She also reports alleviation of tachypalpitations and elevated rates with restart of her beta-blocker.  Echo with asymmetric left ventricular hypertrophy of the septal segment, which should be monitored, and for which we will discuss transition from BB to  verapamil/diltiazem with primary cardiologist in the future.  Consider this finding as the etiology of her constant dizziness and falls.  Recommend hydration under 2 L and with low salt.  Heart rate and BP control.  HTN, BP goal <130/80 --BP today well controlled with restart of beta-blocker.  As above, and given her asymmetric left ventricular hypertrophy seen on echo, consider transition to verapamil in the near future for more ideal treatment of this finding of asymmetric LV.  This was discussed with the patient, as well as ensuring hydration under 2 L and without high salt diet. Reviewed recommendations for total daily fluid and salt intake  HFpEF, newly dx asymmetrical left ventricular hypertrophy of the septal segment --Euvolemic and well compensated on exam.  Wt decreased from previous clinic as above.  Updated echo as above with LV asymmetric hypertrophy of the septal segment, which will be discussed with primary cardiologist.  Emphasis on hydration, as well as heart rate control and BP control stressed today.  She is to let us know if she has any further falls or dizziness and monitor her CP with exertion.  Ensure proper hydration under 2 L and a low-salt diet.  Continue current medications.  HLD --Continue cholesterol medications.    COPD --As above, consider transition away from beta-blockers and to CCB / diltiazem/ verapamil given her known COPD, as well as her asymmetric left ventricular hypertrophy of the septal segment, as this is more guideline mediated.  She continues on nighttime home oxygen and does not need oxygen during the day.   OSA --Continue home O2 / BiPAP / CPAP as recommended per pulmonary. Ongoing dietary and lifestyle recommendations / wt loss advised.   CKD --Repeat referral provided to nephrology today per patient request.  We will repeat a BMET, given plan for cardiac CT, as patient remains anxious regarding her exertional chest pain.  Discussed recommendation for  close monitoring of worsening renal function. Reports FHx of ARF with family members that see Dr. Holley Raring. Refer to nephrology.  Of note, she does bring labs with her today from her PCP.   Disposition: Reach out to primary cardiologist regarding asymmetric left ventricular hypertrophy of her septal segment.  Obtain BMET with further recommendations regarding cardiac CT at that time.  Obtain carotids given left-sided bruit.  RTC after workup.   Arvil Chaco, PA-C 06/25/2020

## 2020-06-28 ENCOUNTER — Telehealth: Payer: Self-pay | Admitting: Physician Assistant

## 2020-06-28 NOTE — Telephone Encounter (Signed)
Arvil Chaco, PA-C  06/28/2020 7:32 AM EDT     Labs show renal function has improved from 1 month ago. GFR is 59. Please use this value for ordering of Cardiac CT orders.

## 2020-06-28 NOTE — Telephone Encounter (Signed)
Attempted to call the patient. No answer- Message received stating the call could not be completed as dialed at this time x 2 attempts.  I clarify with Malachi Bonds that the patient will not require and pre-hydration prior to her Cardiac CT, therefore, this can be scheduled in [place].

## 2020-06-29 NOTE — Telephone Encounter (Signed)
Attempted to call the patient. No answer- Message received stating the call could not be completed as dialed at this time.

## 2020-06-30 ENCOUNTER — Telehealth: Payer: Self-pay | Admitting: *Deleted

## 2020-06-30 ENCOUNTER — Encounter: Payer: Self-pay | Admitting: *Deleted

## 2020-06-30 NOTE — Telephone Encounter (Signed)
-----   Message from Arvil Chaco, PA-C sent at 06/28/2020  7:32 AM EDT ----- Labs show renal function has improved from 1 month ago. GFR is 59. Please use this value for ordering of Cardiac CT orders.

## 2020-06-30 NOTE — Telephone Encounter (Signed)
No answer with message stating call cannot be completed at this time.  Letter with results mailed to patient.

## 2020-06-30 NOTE — Telephone Encounter (Signed)
Duplicate entry. Error.

## 2020-07-02 ENCOUNTER — Ambulatory Visit: Payer: Medicare HMO | Admitting: Family Medicine

## 2020-07-12 ENCOUNTER — Other Ambulatory Visit: Payer: Self-pay

## 2020-07-12 ENCOUNTER — Ambulatory Visit (INDEPENDENT_AMBULATORY_CARE_PROVIDER_SITE_OTHER): Payer: Medicare HMO

## 2020-07-12 DIAGNOSIS — R0989 Other specified symptoms and signs involving the circulatory and respiratory systems: Secondary | ICD-10-CM | POA: Diagnosis not present

## 2020-07-13 ENCOUNTER — Telehealth (INDEPENDENT_AMBULATORY_CARE_PROVIDER_SITE_OTHER): Payer: Medicare HMO | Admitting: Family Medicine

## 2020-07-13 ENCOUNTER — Encounter: Payer: Self-pay | Admitting: Family Medicine

## 2020-07-13 ENCOUNTER — Other Ambulatory Visit: Payer: Self-pay

## 2020-07-13 VITALS — Temp 99.9°F

## 2020-07-13 DIAGNOSIS — J069 Acute upper respiratory infection, unspecified: Secondary | ICD-10-CM | POA: Diagnosis not present

## 2020-07-13 DIAGNOSIS — J0141 Acute recurrent pansinusitis: Secondary | ICD-10-CM

## 2020-07-13 DIAGNOSIS — B373 Candidiasis of vulva and vagina: Secondary | ICD-10-CM | POA: Diagnosis not present

## 2020-07-13 DIAGNOSIS — B3731 Acute candidiasis of vulva and vagina: Secondary | ICD-10-CM

## 2020-07-13 MED ORDER — FLUCONAZOLE 150 MG PO TABS: 150.0000 mg | ORAL_TABLET | ORAL | 0 refills | Status: DC

## 2020-07-13 MED ORDER — FLUTICASONE PROPIONATE 50 MCG/ACT NA SUSP: 2.0000 | Freq: Every day | NASAL | 0 refills | Status: DC

## 2020-07-13 MED ORDER — AMOXICILLIN-POT CLAVULANATE 875-125 MG PO TABS: 1.0000 | ORAL_TABLET | Freq: Two times a day (BID) | ORAL | 0 refills | Status: AC

## 2020-07-13 NOTE — Progress Notes (Signed)
Name: [name]   MRN: [id number]    DOB: October 09, 1965   Date:07/13/2020       Progress Note  Subjective:    Chief Complaint  Chief Complaint  Patient presents with  . Sinusitis    headache, cough, congested, runny nose for 1 week    I connected with  [name] on 07/13/20 at 11:00 AM EST by telephone and verified that I am speaking with the correct person using two identifiers.   I discussed the limitations, risks, security and privacy concerns of performing an evaluation and management service by telephone and the availability of in person appointments. Staff also discussed with the patient that there may be a patient responsible charge related to this service.  Patient verbalized understanding and agreed to proceed with encounter. Patient Location: home Provider Location: [facility] Additional Individuals present: none  Sinusitis This is a recurrent problem. The current episode started in the past 7 days. The problem is unchanged. There has been no fever ("low grade" ). The pain is mild (intermittent pain and pressure). Associated symptoms include chills, congestion, coughing, headaches, a hoarse voice, sinus pressure (sinus pain and pressure to eyes and forehead) and a sore throat. Pertinent negatives include no diaphoresis, ear pain, neck pain, shortness of breath, sneezing or swollen glands. Past treatments include spray decongestants, oral decongestants and acetaminophen (allergy pills at [facility] [employer]). The treatment provided no relief.    Pt had COVID 3 months ago, her family is all sick with colds and coughs She has not been taking her rhinitis meds - out of flonase, not on any 2nd gen antihistamines Cough is dry, no CP SOB wheeze, chest congestion/tightness 7 + days since sx began, worsening sinus pain and pressure, very tender if she touches forehead or around eyes More congestion during day, more drainage and post nasal drip at night Tried some OTC meds - doesn't  know names, nothing helped except tylenol helps with discomfort and h/c chills temporarily  Just had doxycycline for infection to scalp, finished abx 2 weeks ago, needs diflucan with abx  Reviewed allergies to abx - she stated she didn't know where most of the antibiotic allergies came from, she can take penicillins and doesn't remember any rash with keflex    Patient Active Problem List   Diagnosis Date Noted  . Iron deficiency anemia due to chronic blood loss   . Polyp of ascending colon   . Chronic gastric ulcer with hemorrhage   . Type 2 diabetes with decreased circulation ([place]) 11/07/2018  . Vitamin B12 deficiency 08/08/2018  . Encounter for Medicare annual wellness exam 10/30/2017  . Patient is full code 10/30/2017  . Disorder of bursae of shoulder region 10/09/2017  . Coronary artery disease 10/09/2017  . Plantar fascial fibromatosis 10/09/2017  . Tendinitis of wrist 10/09/2017  . Schizoaffective disorder, bipolar type ([place]) 09/28/2017  . Chest pain 09/10/2017  . 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 Breast cancer screening 09/28/2016  . Fatigue 05/01/2016  . Adenomatous colon polyp 10/15/2015  . Bipolar affective disorder ([place]) 10/15/2015  . CN (constipation) 10/15/2015  . Drug-induced hepatic toxicity 10/15/2015  . Blood in feces 10/15/2015  . HLD (hyperlipidemia) 10/15/2015  . Hypoxemia 10/15/2015  . NASH (nonalcoholic steatohepatitis) 10/15/2015  . Morbid obesity ([place]) 10/15/2015  . Restless leg syndrome 10/15/2015  . Type 2 diabetes, uncontrolled, with neuropathy ([place]) 10/15/2015  . Elevated alkaline phosphatase level 09/22/2015  . Low back pain with  left-sided sciatica 08/18/2015  . Encounter for monitoring tricyclic antidepressant therapy 05/13/2015  . Bipolar disorder ([place]) 05/11/2015  . Apnea, sleep 05/05/2015  . Heart valve disease 05/05/2015  . Chronic kidney disease, stage III (moderate) ([facility]) 03/15/2015  . Migraine 03/12/2015  .  COPD (chronic obstructive pulmonary disease) ([place]) 01/20/2015  . 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[place]) 01/20/2015  . GERD (gastroesophageal reflux disease) 01/20/2015  . Hypothyroid   . Vitamin D deficiency   . Urinary incontinence   . Liver lesion   . Hypomagnesemia   . Obstructive sleep apnea 10/20/2013  . Vaginitis 12/19/2011  . PERIPHERAL NEUROPATHY 03/04/2009  . HEMORRHOIDS, EXTERNAL 09/03/2008  . ASTHMA, PERSISTENT, MODERATE 07/04/2007  . INTERSTITIAL CYSTITIS 07/04/2007  . Allergic rhinitis 07/04/2007  . HYPERTENSION, BENIGN ESSENTIAL 06/19/2007    Social History   Tobacco Use  . Smoking status: Never Smoker  . Smokeless tobacco: Never Used  Substance Use Topics  . Alcohol use: No     Current Outpatient Medications:  .  albuterol (VENTOLIN HFA) 108 (90 Base) MCG/ACT inhaler, Inhale 2 puffs into the lungs every 6 (six) hours as needed for wheezing or shortness of breath., Disp: 18 g, Rfl: 3 .  atorvastatin (LIPITOR) 80 MG tablet, Take 80 mg by mouth at bedtime. , Disp: , Rfl:  .  busPIRone (BUSPAR) 10 MG tablet, Take 20 mg by mouth 2 (two) times daily. , Disp: , Rfl:  .  citalopram (CELEXA) 20 MG tablet, Take 20 mg by mouth daily., Disp: , Rfl:  .  doxycycline (VIBRA-TABS) 100 MG tablet, Take 1 tablet (100 mg total) by mouth 2 (two) times daily., Disp: 20 tablet, Rfl: 0 .  empagliflozin (JARDIANCE) 25 MG TABS tablet, Take 25 mg by mouth daily., Disp: , Rfl:  .  ezetimibe (ZETIA) 10 MG tablet, Take 10 mg by mouth daily. , Disp: , Rfl:  .  fenofibrate micronized (LOFIBRA) 134 MG capsule, Take 134 mg by mouth daily., Disp: , Rfl:  .  fluconazole (DIFLUCAN) 150 MG tablet, Take 1 tablet (150 mg total) by mouth every other day., Disp: 3 tablet, Rfl: 0 .  fluticasone (FLONASE) 50 MCG/ACT nasal spray, Place 2 sprays into both nostrils daily., Disp: 16 g, Rfl: 0 .  gabapentin (NEURONTIN) 300 MG capsule, Take 1-2 capsules (300-600 mg total) by mouth 3 (three) times daily. Take 1  capsule each morning, 1 capsule each afternoon, and 2 capsules at bedtime., Disp: 360 capsule, Rfl: 0 .  gabapentin (NEURONTIN) 600 MG tablet, Take 600 mg by mouth daily., Disp: , Rfl:  .  insulin aspart (NOVOLOG FLEXPEN) 100 UNIT/ML FlexPen, Inject 3 Units subcutaneously. Sliding scale., Disp: , Rfl:  .  insulin regular human CONCENTRATED (HUMULIN R) 500 UNIT/ML injection, Inject 70 Units into the skin 3 (three) times daily with meals., Disp: , Rfl:  .  lamoTRIgine (LAMICTAL) 200 MG tablet, Take 200 mg by mouth 2 (two) times daily. , Disp: , Rfl:  .  levothyroxine (SYNTHROID) 50 MCG tablet, TAKE ONE TABLET DAILY 3 DAYS A WEEK, THEN TAKE ONE AND ONE-HALF TABLETS FOUR DAYS A WEEK (Patient taking differently: Take 50 mcg by mouth See admin instructions. Take one tablet daily for 3 days a week, then 1 1/2 tablets 4 days a week), Disp: 108 tablet, Rfl: 0 .  lisinopril (ZESTRIL) 2.5 MG tablet, TAKE 1 TABLET BY MOUTH EVERY DAY (Patient taking differently: Take 2.5 mg by mouth daily. ), Disp: 90 tablet, Rfl: 1 .  metoprolol tartrate (LOPRESSOR) 25 MG  tablet, Take 0.5 tablets (12.5 mg total) by mouth 2 (two) times daily., Disp: 30 tablet, Rfl: 5 .  ondansetron (ZOFRAN) 4 MG tablet, TAKE 1 TABLET BY MOUTH EVERY 8 HOURS AS NEEDED FOR NAUSEA OR VOMITING (Patient taking differently: Take 4 mg by mouth every 8 (eight) hours as needed for nausea or vomiting. ), Disp: 20 tablet, Rfl: 2 .  pantoprazole (PROTONIX) 40 MG tablet, Take 1 tablet (40 mg total) by mouth daily., Disp: 90 tablet, Rfl: 3 .  polyethylene glycol (MIRALAX) 17 g packet, Take 17 g by mouth daily. (Patient taking differently: Take 17 g by mouth daily as needed for mild constipation. ), Disp: 14 each, Rfl: 0 .  QUEtiapine (SEROQUEL) 200 MG tablet, Take 200 mg by mouth at bedtime. Along with 314m dose for a total of 5040m Disp: , Rfl:   Allergies  Allergen Reactions  . Mirtazapine Anaphylaxis    REACTION: closes throat  . Morphine And Related      anaphylaxis  . Sulfamethoxazole-Trimethoprim Rash    REACTION: [place]ps all over  . Amoxicillin Er Nausea And Vomiting    Has patient had a PCN reaction causing immediate rash, facial/tongue/throat swelling, SOB or lightheadedness with hypotension: No Has patient had a PCN reaction causing severe rash involving mucus membranes or skin necrosis: No Has patient had a PCN reaction that required hospitalization: No Has patient had a PCN reaction occurring within the last 10 years: Yes If all of the above answers are "NO", then may proceed with Cephalosporin use.  . Codeine Itching    REACTION: itch, rash  . Hydrocodone-Acetaminophen Rash    REACTION: rash  . Keflex [Cephalexin] Rash  . Prasterone Nausea And Vomiting    Chart Review: I personally reviewed active problem list, medication list, allergies, family history, social history, health maintenance, notes from last encounter, lab results, imaging with the patient/caregiver today.   Review of Systems  Constitutional: Positive for chills and fatigue. Negative for activity change, appetite change, diaphoresis and fever.  HENT: Positive for congestion, hoarse voice, postnasal drip, rhinorrhea, sinus pressure (sinus pain and pressure to eyes and forehead), sinus pain, sore throat and voice change. Negative for ear discharge, ear pain, facial swelling, sneezing, tinnitus and trouble swallowing (scratchy/raspy).   Eyes: Negative.   Respiratory: Positive for cough. Negative for choking, chest tightness, shortness of breath and wheezing.   Cardiovascular: Negative.   Gastrointestinal: Negative.   Endocrine: Negative.   Genitourinary: Negative.   Musculoskeletal: Negative.  Negative for neck pain.  Skin: Negative.   Allergic/Immunologic: Negative.   Neurological: Positive for headaches.  Hematological: Negative.   Psychiatric/Behavioral: Negative.   All other systems reviewed and are negative.    Objective:    Virtual encounter, vitals  limited, only able to obtain the following Today's Vitals   07/13/20 1103  Temp: 99.9 F (37.7 C)  TempSrc: Oral   There is no height or weight on file to calculate BMI. Nursing Note and Vital Signs reviewed.  Physical Exam Pt alert, answering questions appropriately, nasal phonation, frequent throat clearing and sniffing, no audible tachypnea, wheeze or stridor, able to speak in full short sentences PE limited by telephone encounter  No results found for this or any previous visit (from the past 72 hour(s)).  Assessment and Plan:     ICD-10-CM   1. Acute recurrent pansinusitis  J01.41    reviewed all abx allergies/sensitivities, just had doxycycline 2 weeks ago, will tx ABS with augmentin, no contraindication to penicillin, take with  food  2. Yeast vaginitis  B37.3 fluconazole (DIFLUCAN) 150 MG tablet   secondary to abx use - refilled for use if pt has recurring vaginal yeast infection sx  3. Viral upper respiratory tract infection  J06.9    Pt advised to restart flonase and add a once daily zyrtec, claritin or allegra for the next couple weeks, can continue OTC meds (per past recommendations with other Dx, NASH etc) We reviewed all of her antibiotic allergies specifically penicillins amoxicillin and Keflex all of which she denied any severe reactions, most of which she can you remember why there is allergy to those medicines on her chart.  She denies any anaphylactic reaction to amoxicillin in the past.  She had noted nausea and vomiting with amoxicillin I encouraged her to try Augmentin to take with food in her stomach and to notify me if she has any severe vomiting or intolerance of this. Because she just had doxycycline would like to try a different medication that is indicated for acute bacterial sinusitis  Pt just had COVID 3 months ago, unlikely to be a second case, most likely sx viral vs allergic and worsened causing bacterial sinus infection.  Pt has multiple medical  conditions and will tx more aggressively with abx and not wait since she has been sx for more than a week.   -Red flags and when to present for emergency care or RTC including but not limited to new/worsening/un-resolving symptoms,  reviewed with patient at time of visit. Follow up and care instructions discussed and provided in AVS. - I discussed the assessment and treatment plan with the patient. The patient was provided an opportunity to ask questions and all were answered. The patient agreed with the plan and demonstrated an understanding of the instructions.  - The patient was advised to call back or seek an in-person evaluation if the symptoms worsen or if the condition fails to improve as anticipated.  I provided 20+ minutes of non-face-to-face time during this encounter.  [name], PA-C 07/13/20 12:24 PM

## 2020-07-16 ENCOUNTER — Telehealth: Payer: Self-pay | Admitting: Physician Assistant

## 2020-07-16 NOTE — Telephone Encounter (Signed)
Attempted to call the patient. No answer- I left a detailed message on her identified voice mail of results (ok per DPR) & to please call back with any questions/ concerns.

## 2020-07-16 NOTE — Telephone Encounter (Signed)
Arvil Chaco, PA-C  07/16/2020 2:28 PM EST     Please let Ms. Levitz know that her carotid study was without any evidence of significant blockages, which is very reassuring. Continue ASA and statin + Zetia for risk factor modification.

## 2020-07-27 ENCOUNTER — Other Ambulatory Visit: Payer: Self-pay | Admitting: Family Medicine

## 2020-07-27 NOTE — Telephone Encounter (Signed)
Requested Prescriptions  Pending Prescriptions Disp Refills  . lisinopril (ZESTRIL) 2.5 MG tablet [Pharmacy Med Name: LISINOPRIL 2.5 MG TAB] 30 tablet 0    Sig: TAKE 1 TABLET BY MOUTH EVERY DAY     Cardiovascular:  ACE Inhibitors Failed - 07/27/2020  4:23 PM      Failed - Cr in normal range and within 180 days    Creat  Date Value Ref Range Status  11/08/2018 1.12 (H) 0.50 - 1.05 mg/dL Final    Comment:    For patients >56 years of age, the reference limit for Creatinine is approximately 13% higher for people identified as African-American. .    Creatinine, Ser  Date Value Ref Range Status  06/25/2020 1.10 (H) 0.44 - 1.00 mg/dL Final   Creatinine,U  Date Value Ref Range Status  06/08/2008 93.0 mg/dL Final   Creatinine, Urine  Date Value Ref Range Status  10/09/2017 72 20 - 275 mg/dL Final         Passed - K in normal range and within 180 days    Potassium  Date Value Ref Range Status  06/25/2020 4.0 3.5 - 5.1 mmol/L Final  10/09/2014 4.7 3.5 - 5.1 mmol/L Final         Passed - Patient is not pregnant      Passed - Last BP in normal range    BP Readings from Last 1 Encounters:  06/25/20 120/70         Passed - Valid encounter within last 6 months    Recent Outpatient Visits          2 weeks ago Acute recurrent pansinusitis   [facility] [name], PA-C   1 month ago Abscess, scalp   [facility] Steele Sizer, MD   2 months ago Hospital discharge follow-up   [facility] Steele Sizer, MD   6 months ago Viral upper respiratory tract infection   [facility] [place], Drue Stager, MD   7 months ago RLS (restless legs syndrome)   [facility] Steele Sizer, MD      Future Appointments            In 1 month Gollan[facility] Kathlene November, [facility]   In 1 month [name], MD [place]   In 2 months  [facility]           Attempted to call pt.  Left vm to call and schedule f/u appt.  Courtesy refill; #30 tabs given.

## 2020-08-03 ENCOUNTER — Encounter: Payer: Self-pay | Admitting: Family Medicine

## 2020-08-03 ENCOUNTER — Other Ambulatory Visit: Payer: Self-pay

## 2020-08-03 ENCOUNTER — Telehealth (INDEPENDENT_AMBULATORY_CARE_PROVIDER_SITE_OTHER): Payer: Medicare HMO | Admitting: Family Medicine

## 2020-08-03 VITALS — BP 130/84 | HR 96 | Temp 98.7°F | Resp 18 | Ht 59.0 in | Wt 201.7 lb

## 2020-08-03 DIAGNOSIS — J310 Chronic rhinitis: Secondary | ICD-10-CM

## 2020-08-03 DIAGNOSIS — J329 Chronic sinusitis, unspecified: Secondary | ICD-10-CM | POA: Diagnosis not present

## 2020-08-03 MED ORDER — IPRATROPIUM BROMIDE 0.03 % NA SOLN: 2.0000 | Freq: Two times a day (BID) | NASAL | 1 refills | Status: DC

## 2020-08-03 NOTE — Progress Notes (Signed)
Patient ID: [name], female    DOB: 1966-08-11, 53 y.o.   MRN: [id number]  PCP: Steele Sizer, MD  Chief Complaint  Patient presents with  . Sinusitis    Subjective:   [name] is a 54 y.o. female, presents to clinic with CC of the following:  HPI  Patient presents with continued nasal drainage, postnasal drip, scratchy throat that feels like there is something stuck there or a bump there.  She has restarted Allegra and Flonase and she was prescribed Augmentin on 07/13/2020 after doing a virtual visit.  She had just finished doxycycline antibiotics.  She states that while on antibiotics she felt little bit better but as soon as she stopped it her symptoms did gradually return and worsen.  She has scratchy throat, is constantly clearing her throat, she has some sinus pressure, she has not had any fever, severe facial pain or headaches, no chest pain shortness of breath, hot or cold chills. Her daughter yesterday had a sore throat was diagnosed with strep throat they were briefly around each other The whole family recently had Covid and has recovered Biggest concern is her drainage and irritation in her throat  Patient Active Problem List   Diagnosis Date Noted  . Iron deficiency anemia due to chronic blood loss   . Polyp of ascending colon   . Chronic gastric ulcer with hemorrhage   . Type 2 diabetes with decreased circulation ([place]) 11/07/2018  . Vitamin B12 deficiency 08/08/2018  . Encounter for Medicare annual wellness exam 10/30/2017  . Patient is full code 10/30/2017  . Disorder of bursae of shoulder region 10/09/2017  . Coronary artery disease 10/09/2017  . Plantar fascial fibromatosis 10/09/2017  . Tendinitis of wrist 10/09/2017  . Schizoaffective disorder, bipolar type ([place]) 09/28/2017  . Chest pain 09/10/2017  . 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 Breast cancer screening 09/28/2016  . Fatigue 05/01/2016  . Adenomatous colon polyp  10/15/2015  . Bipolar affective disorder ([place]) 10/15/2015  . CN (constipation) 10/15/2015  . Drug-induced hepatic toxicity 10/15/2015  . Blood in feces 10/15/2015  . HLD (hyperlipidemia) 10/15/2015  . Hypoxemia 10/15/2015  . NASH (nonalcoholic steatohepatitis) 10/15/2015  . Morbid obesity ([place]) 10/15/2015  . Restless leg syndrome 10/15/2015  . Type 2 diabetes, uncontrolled, with neuropathy ([place]) 10/15/2015  . Elevated alkaline phosphatase level 09/22/2015  . Low back pain with left-sided sciatica 08/18/2015  . Encounter for monitoring tricyclic antidepressant therapy 05/13/2015  . Bipolar disorder ([place]) 05/11/2015  . Apnea, sleep 05/05/2015  . Heart valve disease 05/05/2015  . Chronic kidney disease, stage III (moderate) ([facility]) 03/15/2015  . Migraine 03/12/2015  . COPD (chronic obstructive pulmonary disease) ([place]) 01/20/2015  . 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[place]) 01/20/2015  . GERD (gastroesophageal reflux disease) 01/20/2015  . Hypothyroid   . Vitamin D deficiency   . Urinary incontinence   . Liver lesion   . Hypomagnesemia   . Obstructive sleep apnea 10/20/2013  . Vaginitis 12/19/2011  . PERIPHERAL NEUROPATHY 03/04/2009  . HEMORRHOIDS, EXTERNAL 09/03/2008  . ASTHMA, PERSISTENT, MODERATE 07/04/2007  . INTERSTITIAL CYSTITIS 07/04/2007  . Allergic rhinitis 07/04/2007  . HYPERTENSION, BENIGN ESSENTIAL 06/19/2007      Current Outpatient Medications:  .  albuterol (VENTOLIN HFA) 108 (90 Base) MCG/ACT inhaler, Inhale 2 puffs into the lungs every 6 (six) hours as needed for wheezing or shortness of breath., Disp: 18 g, Rfl: 3 .  atorvastatin (LIPITOR) 80 MG tablet, Take 80  mg by mouth at bedtime. , Disp: , Rfl:  .  busPIRone (BUSPAR) 10 MG tablet, Take 20 mg by mouth 2 (two) times daily. , Disp: , Rfl:  .  citalopram (CELEXA) 20 MG tablet, Take 20 mg by mouth daily., Disp: , Rfl:  .  empagliflozin (JARDIANCE) 25 MG TABS tablet, Take 25 mg by mouth daily., Disp: , Rfl:  .   ezetimibe (ZETIA) 10 MG tablet, Take 10 mg by mouth daily. , Disp: , Rfl:  .  fenofibrate micronized (LOFIBRA) 134 MG capsule, Take 134 mg by mouth daily., Disp: , Rfl:  .  fluconazole (DIFLUCAN) 150 MG tablet, Take 1 tablet (150 mg total) by mouth every other day., Disp: 3 tablet, Rfl: 0 .  fluticasone (FLONASE) 50 MCG/ACT nasal spray, Place 2 sprays into both nostrils daily., Disp: 16 g, Rfl: 0 .  gabapentin (NEURONTIN) 300 MG capsule, Take 1-2 capsules (300-600 mg total) by mouth 3 (three) times daily. Take 1 capsule each morning, 1 capsule each afternoon, and 2 capsules at bedtime., Disp: 360 capsule, Rfl: 0 .  gabapentin (NEURONTIN) 600 MG tablet, Take 600 mg by mouth daily., Disp: , Rfl:  .  insulin aspart (NOVOLOG FLEXPEN) 100 UNIT/ML FlexPen, Inject 3 Units subcutaneously. Sliding scale., Disp: , Rfl:  .  insulin regular human CONCENTRATED (HUMULIN R) 500 UNIT/ML injection, Inject 70 Units into the skin 3 (three) times daily with meals., Disp: , Rfl:  .  lamoTRIgine (LAMICTAL) 200 MG tablet, Take 200 mg by mouth 2 (two) times daily. , Disp: , Rfl:  .  levothyroxine (SYNTHROID) 50 MCG tablet, TAKE ONE TABLET DAILY 3 DAYS A WEEK, THEN TAKE ONE AND ONE-HALF TABLETS FOUR DAYS A WEEK (Patient taking differently: Take 50 mcg by mouth See admin instructions. Take one tablet daily for 3 days a week, then 1 1/2 tablets 4 days a week), Disp: 108 tablet, Rfl: 0 .  lisinopril (ZESTRIL) 2.5 MG tablet, TAKE 1 TABLET BY MOUTH EVERY DAY, Disp: 30 tablet, Rfl: 0 .  metoprolol tartrate (LOPRESSOR) 25 MG tablet, Take 0.5 tablets (12.5 mg total) by mouth 2 (two) times daily., Disp: 30 tablet, Rfl: 5 .  ondansetron (ZOFRAN) 4 MG tablet, TAKE 1 TABLET BY MOUTH EVERY 8 HOURS AS NEEDED FOR NAUSEA OR VOMITING (Patient taking differently: Take 4 mg by mouth every 8 (eight) hours as needed for nausea or vomiting. ), Disp: 20 tablet, Rfl: 2 .  pantoprazole (PROTONIX) 40 MG tablet, Take 1 tablet (40 mg total) by mouth  daily., Disp: 90 tablet, Rfl: 3 .  polyethylene glycol (MIRALAX) 17 g packet, Take 17 g by mouth daily. (Patient taking differently: Take 17 g by mouth daily as needed for mild constipation. ), Disp: 14 each, Rfl: 0 .  QUEtiapine (SEROQUEL) 200 MG tablet, Take 200 mg by mouth at bedtime. Along with 346m dose for a total of 5053m Disp: , Rfl:  .  ipratropium (ATROVENT) 0.03 % nasal spray, Place 2 sprays into both nostrils every 12 (twelve) hours., Disp: 30 mL, Rfl: 1   Allergies  Allergen Reactions  . Mirtazapine Anaphylaxis    REACTION: closes throat  . Morphine And Related     anaphylaxis  . Sulfamethoxazole-Trimethoprim Rash    REACTION: [place] all over  . Amoxicillin Er Nausea And Vomiting    Pt doesn't remember problem with amoxicillin -  SE Has patient had a PCN reaction causing immediate rash, facial/tongue/throat swelling, SOB or lightheadedness with hypotension: No Has patient had a PCN reaction  causing severe rash involving mucus membranes or skin necrosis: No Has patient had a PCN reaction that required hospitalization: No Has patient had a PCN reaction occurring within the last 10 years: Yes If all of the above answers are "NO", then may proceed with Cephalosporin use.  . Codeine Itching    REACTION: itch, rash  . Hydrocodone-Acetaminophen Rash    REACTION: rash  . Keflex [Cephalexin] Rash    Pt does not remember having a reaction or rash, no airway involvement  . Prasterone Nausea And Vomiting     Social History   Tobacco Use  . Smoking status: Never Smoker  . Smokeless tobacco: Never Used  Vaping Use  . Vaping Use: Never used  Substance Use Topics  . Alcohol use: No  . Drug use: No      Chart Review Today: I personally reviewed active problem list, medication list, allergies, family history, social history, health maintenance, notes from last encounter, lab results, imaging with the patient/caregiver today.   Review of Systems 10 Systems reviewed and  are negative for acute change except as noted in the HPI.     Objective:   Vitals:   08/03/20 1305  BP: 130/84  Pulse: 96  Resp: 18  Temp: 98.7 F (37.1 C)  TempSrc: Oral  SpO2: 96%  Weight: 201 lb 11.2 oz (91.5 kg)  Height: 4' 11"  (1.499 m)    Body mass index is 40.74 kg/m.  Physical Exam Vitals and nursing note reviewed.  Constitutional:      General: She is not in acute distress.    Appearance: She is obese. She is not ill-appearing, toxic-appearing or diaphoretic.  HENT:     Head: Normocephalic and atraumatic. No right periorbital erythema or left periorbital erythema.     Jaw: There is normal jaw occlusion.     Right Ear: Tympanic membrane, ear canal and external ear normal. No swelling or tenderness. There is no impacted cerumen. No mastoid tenderness.     Left Ear: Ear canal and external ear normal. No swelling or tenderness. A middle ear effusion is present. There is no impacted cerumen. No mastoid tenderness.     Nose: Congestion and rhinorrhea present. Rhinorrhea is clear.     Right Nostril: No occlusion.     Left Nostril: No occlusion.     Right Turbinates: Swollen.     Left Turbinates: Swollen.     Right Sinus: No maxillary sinus tenderness or frontal sinus tenderness.     Left Sinus: No maxillary sinus tenderness or frontal sinus tenderness.     Mouth/Throat:     Lips: Pink.     Mouth: Mucous membranes are moist. No injury.     Tongue: No lesions.     Pharynx: Oropharynx is clear. Uvula midline. No pharyngeal swelling, oropharyngeal exudate, posterior oropharyngeal erythema or uvula swelling.     Tonsils: No tonsillar exudate or tonsillar abscesses. 0 on the right. 0 on the left.     Comments: Mild OP injection Cardiovascular:     Rate and Rhythm: Normal rate and regular rhythm.     Pulses: Normal pulses.     Heart sounds: Normal heart sounds.  Pulmonary:     Effort: Pulmonary effort is normal. No respiratory distress.     Breath sounds: Normal breath  sounds. No stridor. No wheezing or rales.  Abdominal:     General: Bowel sounds are normal.     Palpations: Abdomen is soft.  Lymphadenopathy:     Cervical:  No cervical adenopathy.  Skin:    General: Skin is warm and dry.     Coloration: Skin is not jaundiced or pale.  Neurological:     Mental Status: She is alert. Mental status is at baseline.      Results for orders placed or performed during the hospital encounter of 27/03/50  Basic metabolic panel  Result Value Ref Range   Sodium 141 135 - 145 mmol/L   Potassium 4.0 3.5 - 5.1 mmol/L   Chloride 105 98 - 111 mmol/L   CO2 26 22 - 32 mmol/L   Glucose, Bld 122 (H) 70 - 99 mg/dL   BUN 34 (H) 6 - 20 mg/dL   Creatinine, Ser 1.10 (H) 0.44 - 1.00 mg/dL   Calcium 9.5 8.9 - 10.3 mg/dL   GFR, Estimated 59 (L) >60 mL/min   Anion gap 10 5 - 15       Assessment & Plan:     ICD-10-CM   1. Rhinosinusitis  J31.0    J32.9    continued nasal drainage, nasal mucosal edema and erythema, left serous effusion, OP mildly irritated - likely uncontrolled allergies vs viral   Do not feel pt needs another round of abx, no acute bacterial sinusitis she has no fever no sinus tenderness to palpation she has no cervical lymphadenopathy, but continues to have uncontrolled nasal discharge and drainage and postnasal drip encouraged her to continue her allergy medications and symptomatic management.  She started Allegra but it does not seem to been helpful at all encouraged her to try switching that, weather changes may also be irritating her mucosa encouraged her to use a saline nasal spray and a humidifier or cool mister    Possible exposure to strep but pt is not having sx concerning for strep pharyngitis at this time - f/up and would treat if she did develop those sx  Offered referral to ENT if patient continues to be very symptomatic  visit was changed to in person and pt was seen and assessed in clinic today in normal acute office visit     [name], PA-C 08/03/20 1:30 PM

## 2020-08-03 NOTE — Patient Instructions (Signed)
Switch you allergy pill but continue to take every day Continue your steroid nose spray and try to add this additional nose spray to that OR you can get saline spray and this may also help with nasal symptoms  Your throat looks good, but is probably irritated from post nasal drip  I do not think you need another antibiotic prescription.  I recommend going to an [facility] specialist if you continue to get very will with your nasal allergies and drainage   Postnasal Drip Postnasal drip is the feeling of mucus going down the back of your throat. Mucus is a slimy substance that moistens and cleans your nose and throat, as well as the air pockets in face bones near your forehead and cheeks (sinuses). Small amounts of mucus pass from your nose and sinuses down the back of your throat all the time. This is normal. When you produce too much mucus or the mucus gets too thick, you can feel it. Some common causes of postnasal drip include:  Having more mucus because of: ? A cold or the flu. ? Allergies. ? Cold air. ? Certain medicines.  Having more mucus that is thicker because of: ? A sinus or nasal infection. ? Dry air. ? A food allergy. Follow these instructions at home: Relieving discomfort   Gargle with a salt-water mixture 3-4 times a day or as needed. To make a salt-water mixture, completely dissolve -1 tsp of salt in 1 cup of warm water.  If the air in your home is dry, use a humidifier to add moisture to the air.  Use a saline spray or container (neti pot) to flush out the nose (nasal irrigation). These methods can help clear away mucus and keep the nasal passages moist. General instructions  Take over-the-counter and prescription medicines only as told by your health care provider.  Follow instructions from your health care provider about eating or drinking restrictions. You may need to avoid caffeine.  Avoid things that you know you are allergic to (allergens), like  dust, mold, pollen, pets, or certain foods.  Drink enough fluid to keep your urine pale yellow.  Keep all follow-up visits as told by your health care provider. This is important. Contact a health care provider if:  You have a fever.  You have a sore throat.  You have difficulty swallowing.  You have headache.  You have sinus pain.  You have a cough that does not go away.  The mucus from your nose becomes thick and is green or yellow in color.  You have cold or flu symptoms that last more than 10 days. Summary  Postnasal drip is the feeling of mucus going down the back of your throat.  If your health care provider approves, use nasal irrigation or a nasal spray 2?4 times a day.  Avoid things that you know you are allergic to (allergens), like dust, mold, pollen, pets, or certain foods. This information is not intended to replace advice given to you by your health care provider. Make sure you discuss any questions you have with your health care provider. Document Revised: 12/13/2018 Document Reviewed: 12/04/2016 Elsevier Patient Education  [place].

## 2020-08-06 IMAGING — CT CT HEAD WITHOUT CONTRAST
3 series · 15 of 46 positions shown, 18 images · non-contrast
Comparison: 07/04/2018

CLINICAL DATA: Head trauma, fall

EXAM:
CT HEAD WITHOUT CONTRAST
TECHNIQUE: Contiguous axial images were obtained from the base of the skull
through the vertex without intravenous contrast.

[Series 2: head wo · axial · 0.47mm/px · z∈[-106,+14]mm · 9 of 29 slices shown, 12 images]
[im 3/29  brain]
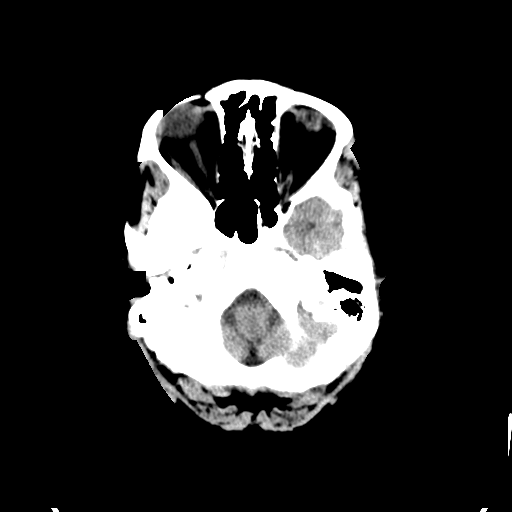
[im 3/29  bone]
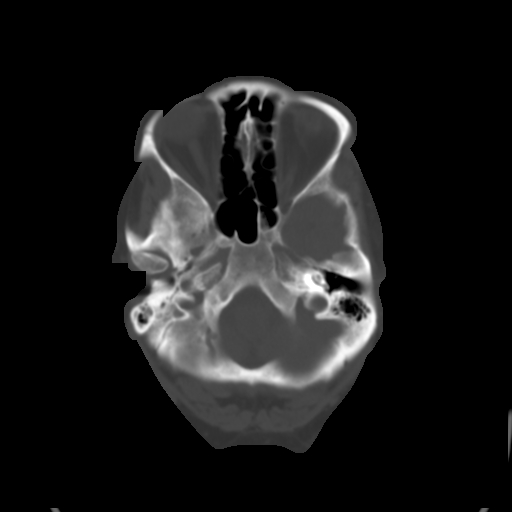
[im 6/29  brain]
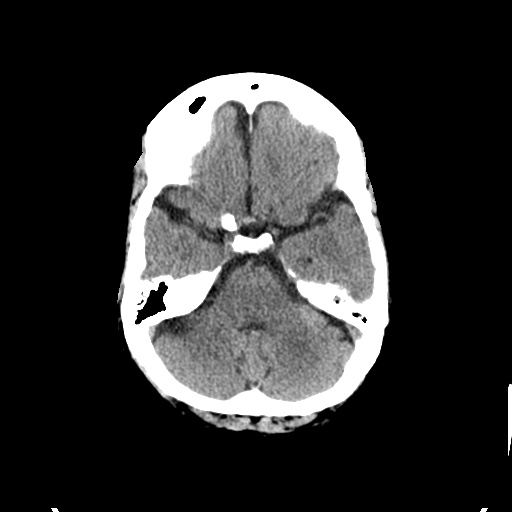
[im 9/29  brain]
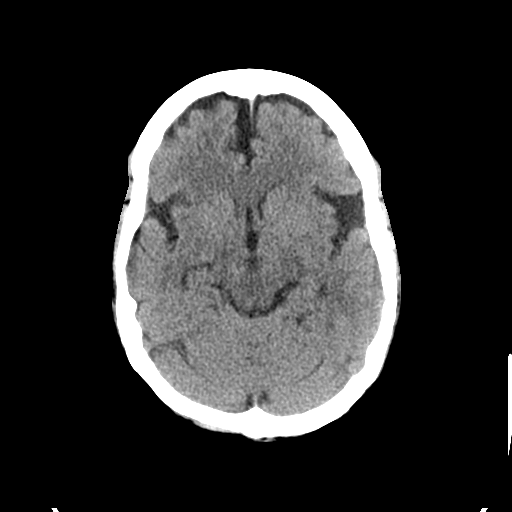
[im 12/29  brain]
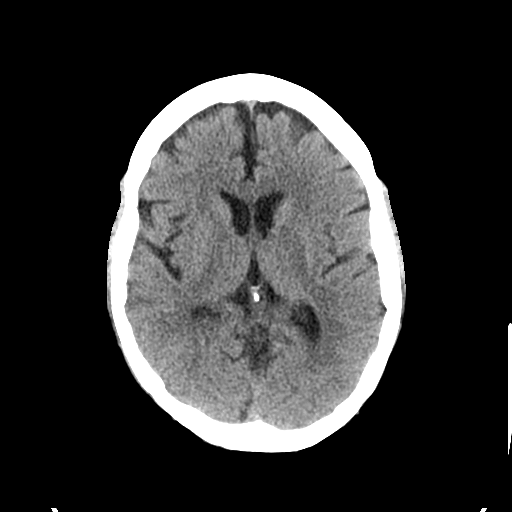
[im 15/29  brain]
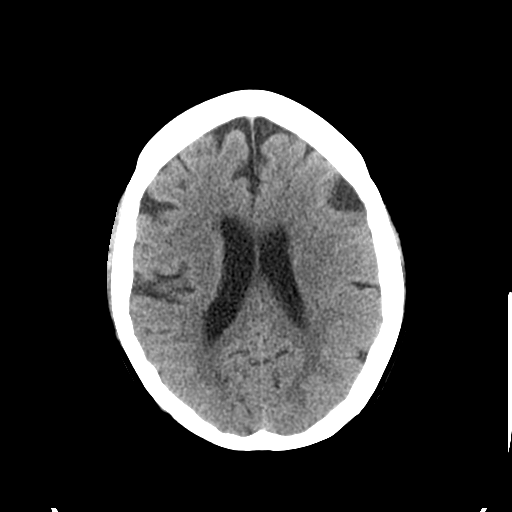
[im 15/29  bone]
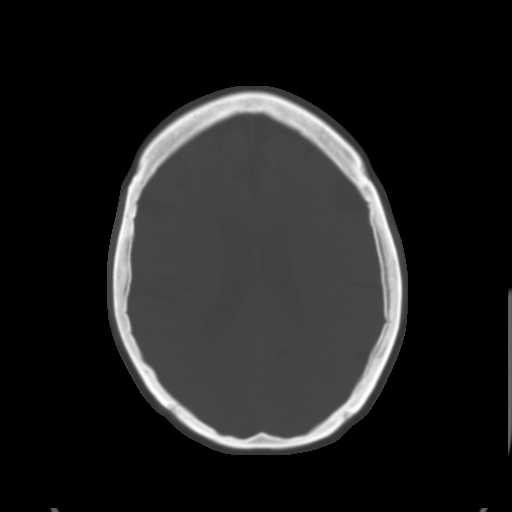
[im 18/29  brain]
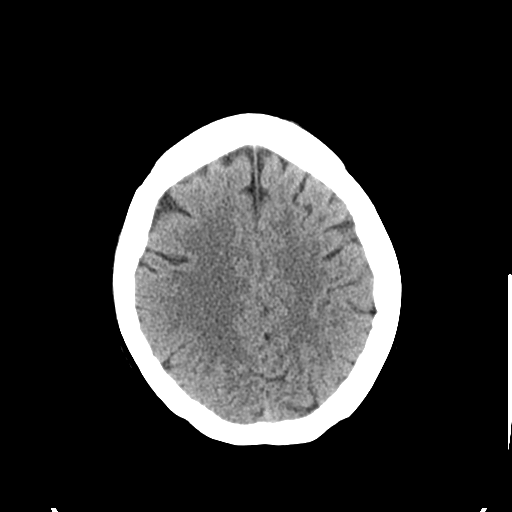
[im 21/29  brain]
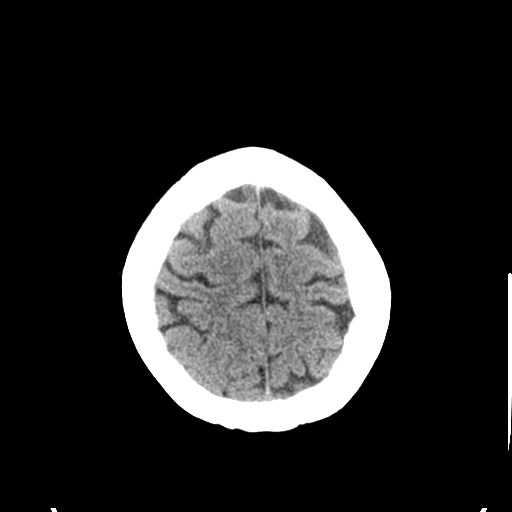
[im 24/29  brain]
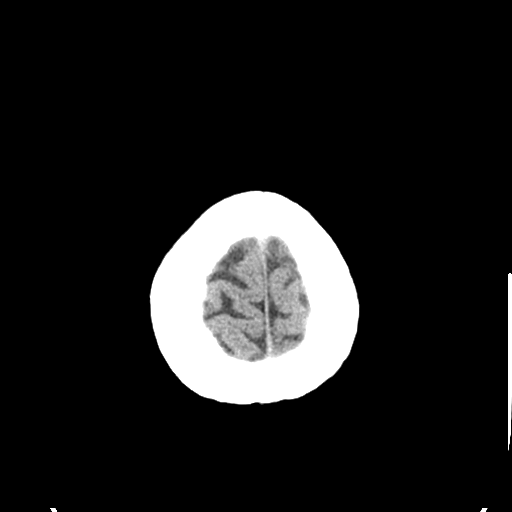
[im 27/29  brain]
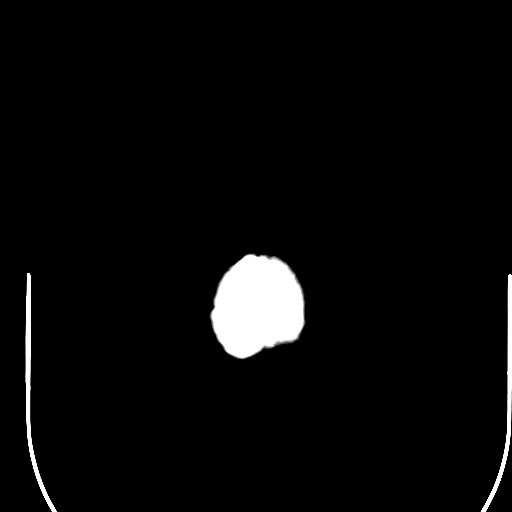
[im 27/29  bone]
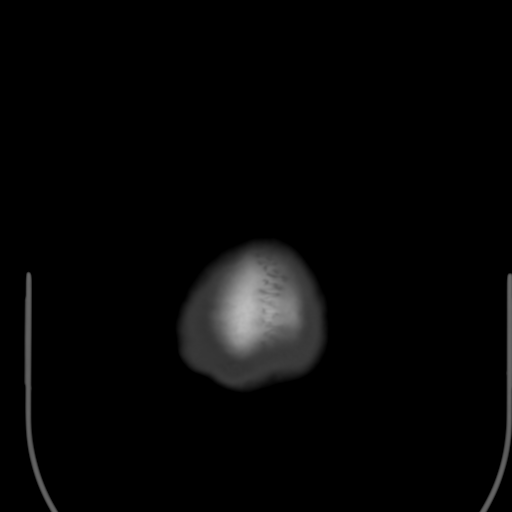

[Series 4: coronal soft tissue · coronal · 0.28mm/px · 3 of 63 slices shown]
[im 21/63  brain]
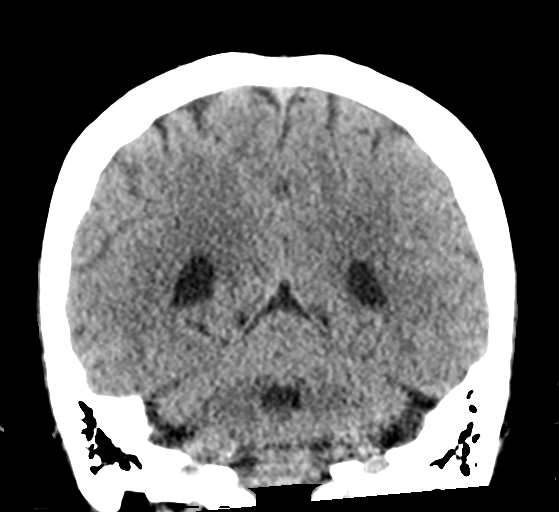
[im 28/63  brain]
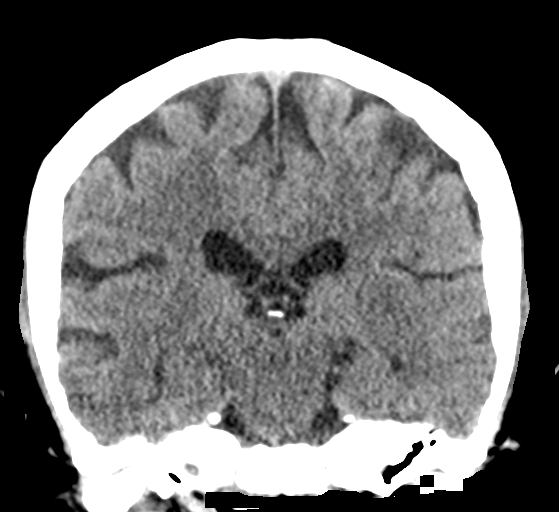
[im 35/63  brain]
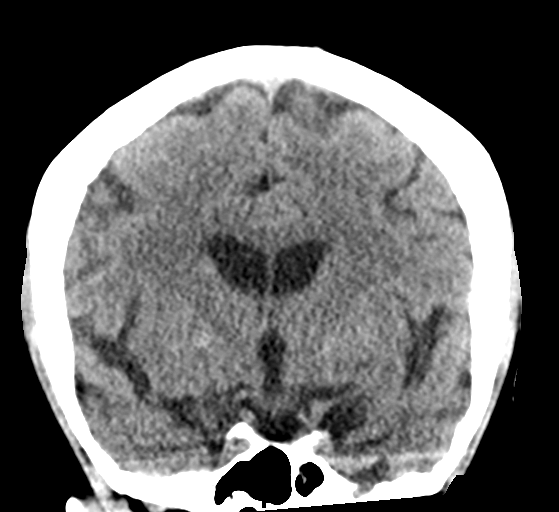

[Series 5: sagittal soft tissue · sagittal · 0.28mm/px · 3 of 52 slices shown]
[im 18/52  brain]
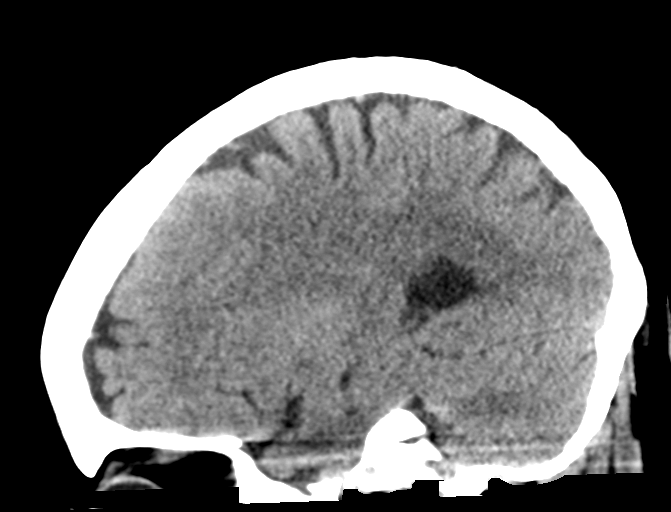
[im 26/52  brain]
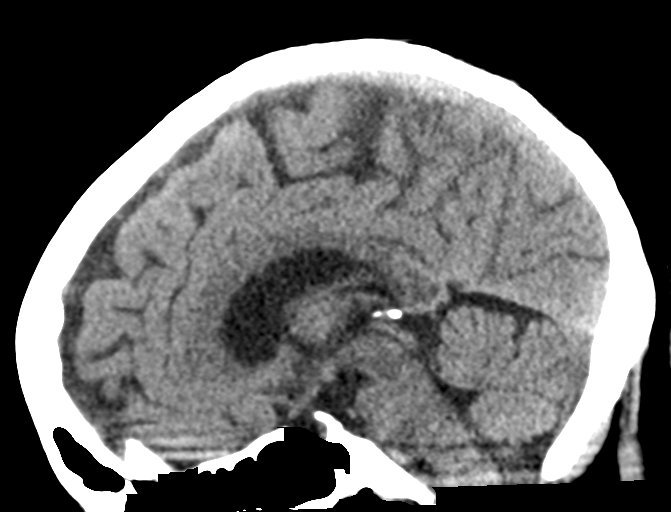
[im 35/52  brain]
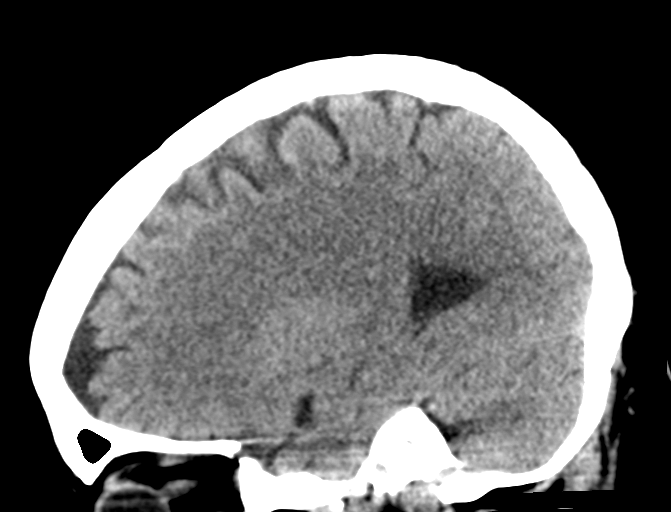

[15 of 46 positions shown; findings below may reference images not displayed]

FINDINGS: Brain: There is no mass, hemorrhage or extra-axial collection. The
size and configuration of the ventricles and extra-axial CSF spaces
are normal. There is hypoattenuation of the white matter, most
commonly indicating chronic small vessel disease.

Vascular: No abnormal hyperdensity of the major intracranial
arteries or dural venous sinuses. No intracranial atherosclerosis.

Skull: The visualized skull base, calvarium and extracranial soft
tissues are normal.

Sinuses/Orbits: No fluid levels or advanced mucosal thickening of
the visualized paranasal sinuses. No mastoid or middle ear effusion.
The orbits are normal.
IMPRESSION: Chronic small vessel disease without acute intracranial abnormality.

## 2020-08-06 IMAGING — CR CHEST - 2 VIEW
1 series · 2 of 2 positions shown · non-contrast
Comparison: 07/06/2018

CLINICAL DATA: Pt reports syncope and fall yesterday with positive
LOC. Reports left sided chest pain under left breast since.

EXAM:
CHEST - 2 VIEW

[Series 1: dg chest 2 view · 0.14mm/px · 2 of 2 slices shown]
[im 1/2]
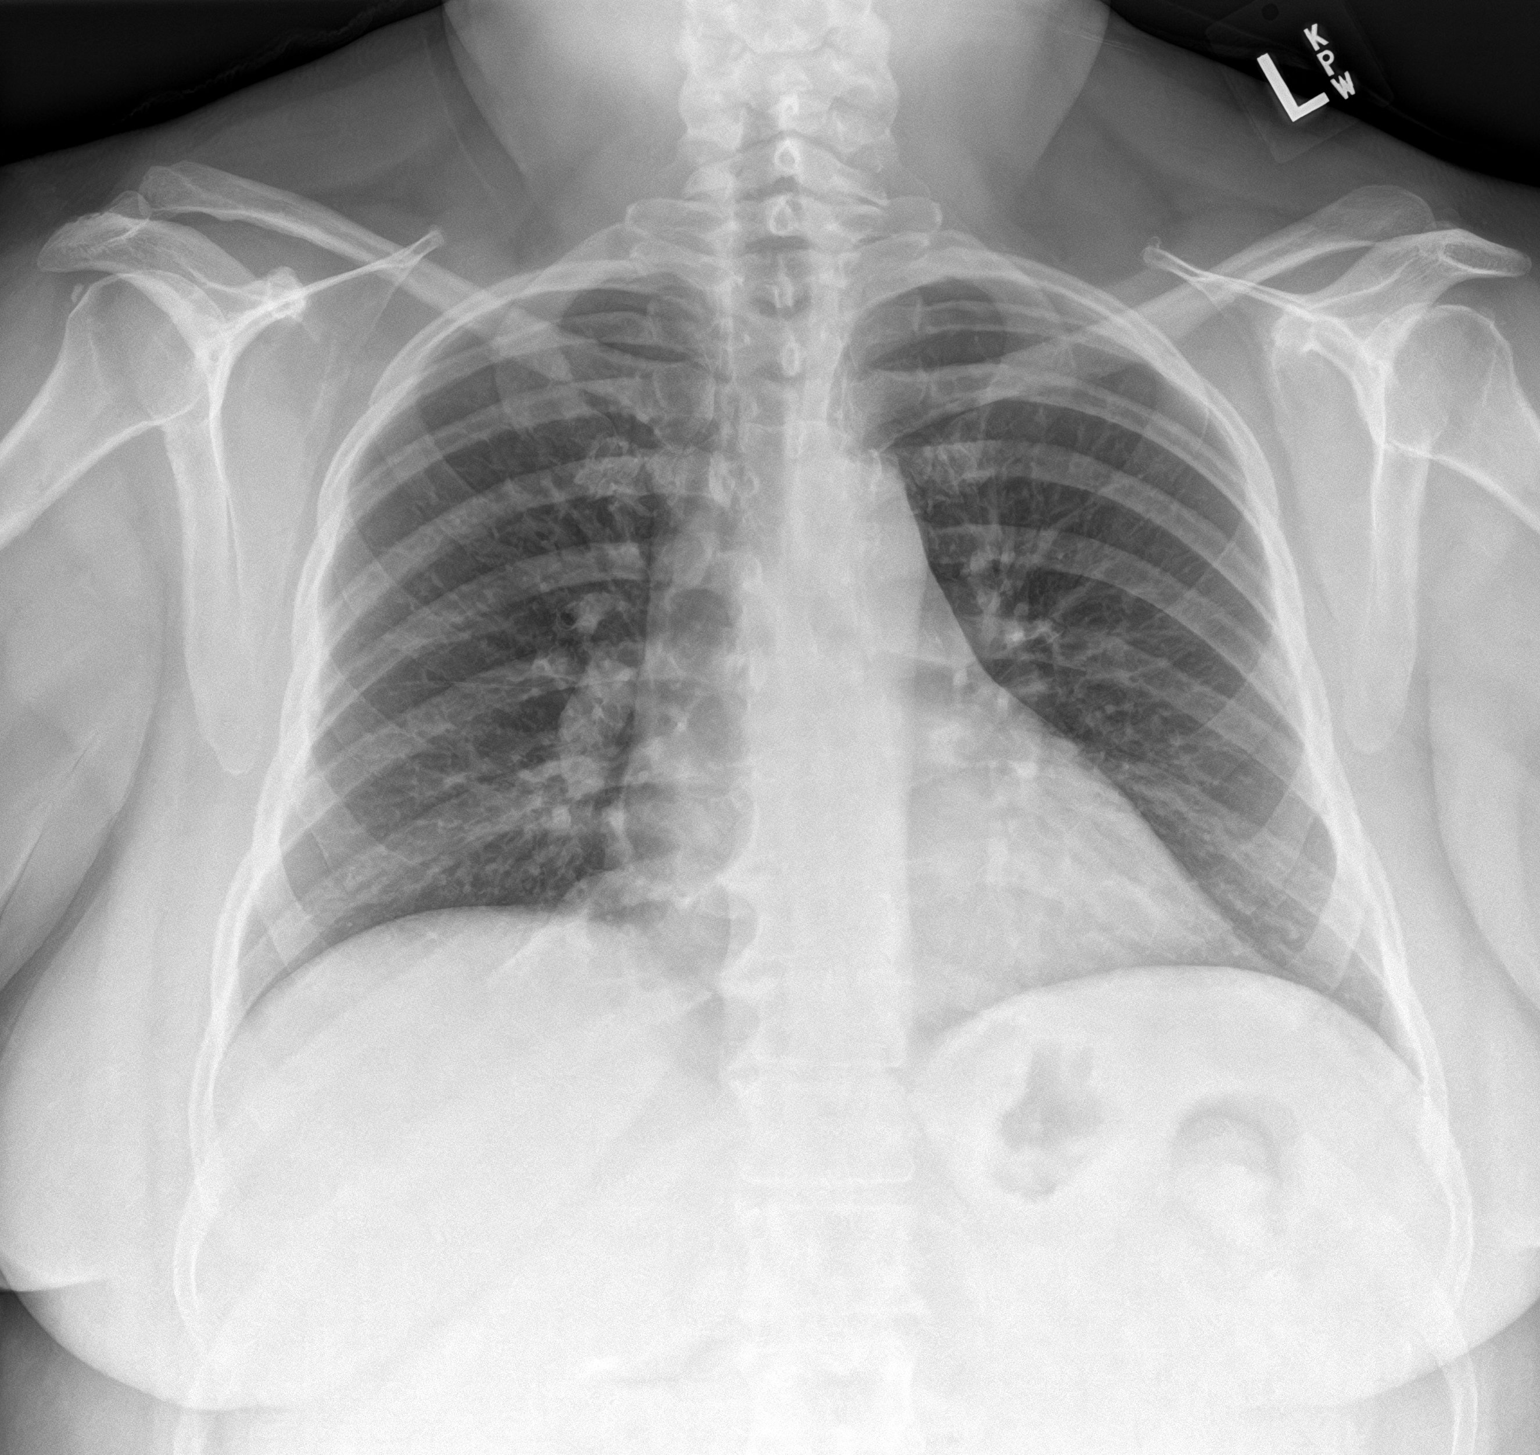
[im 2/2]
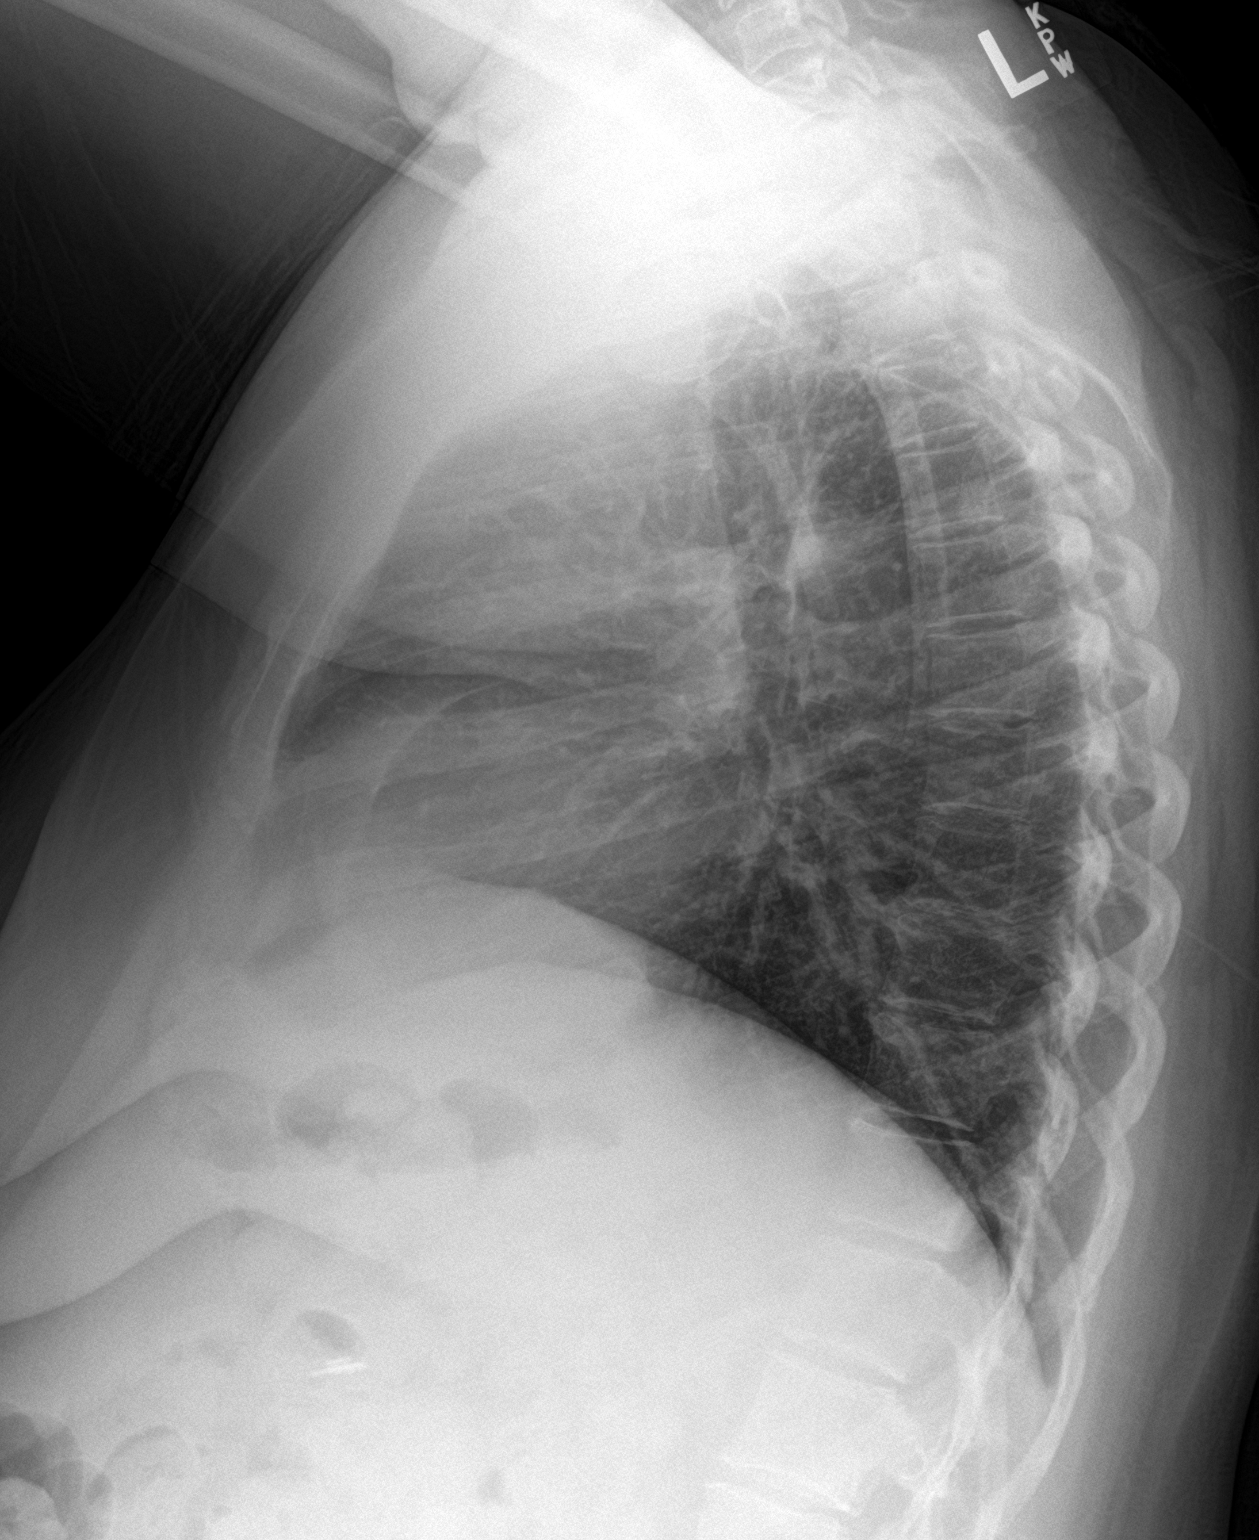

[2 of 2 positions shown; findings below may reference images not displayed]

FINDINGS: The heart size and mediastinal contours are within normal limits.
Both lungs are clear. The visualized skeletal structures are
unremarkable.
IMPRESSION: No active cardiopulmonary disease.

## 2020-08-17 ENCOUNTER — Ambulatory Visit (INDEPENDENT_AMBULATORY_CARE_PROVIDER_SITE_OTHER): Payer: Medicare HMO | Admitting: Internal Medicine

## 2020-08-17 DIAGNOSIS — J0100 Acute maxillary sinusitis, unspecified: Secondary | ICD-10-CM

## 2020-08-17 DIAGNOSIS — H6691 Otitis media, unspecified, right ear: Secondary | ICD-10-CM

## 2020-08-17 DIAGNOSIS — E1151 Type 2 diabetes mellitus with diabetic peripheral angiopathy without gangrene: Secondary | ICD-10-CM

## 2020-08-17 DIAGNOSIS — J40 Bronchitis, not specified as acute or chronic: Secondary | ICD-10-CM | POA: Diagnosis not present

## 2020-08-17 DIAGNOSIS — J449 Chronic obstructive pulmonary disease, unspecified: Secondary | ICD-10-CM

## 2020-08-17 MED ORDER — AZITHROMYCIN 250 MG PO TABS: ORAL_TABLET | ORAL | 0 refills | Status: DC

## 2020-08-17 NOTE — Progress Notes (Signed)
Name: [name]   MRN: [id number]    DOB: July 20, 1966   Date:08/17/2020       Progress Note  Subjective  Chief Complaint  Chief Complaint  Patient presents with  . Cough  . Nasal Congestion  . Sore Throat  . Facial Pain         I connected with  [name] on 08/17/20 at 10:20 AM EST by telephone and verified that I am speaking with the correct person using two identifiers.  I discussed the limitations, risks, security and privacy concerns of performing an evaluation and management service by telephone and the availability of in person appointments. The patient expressed understanding and agreed to proceed. Staff also discussed with the patient that there may be a patient responsible charge related to this service. Patient Location: Home Provider Location: [name] Additional Individuals present: none  HPI Patient is a 54 year old female patient of [name] Last visit was with [name] in late November for rhinosinusitis, and had been seen recently prior to that visit as well earlier in the month of November Follows up today with a phone visit with the above complaints.  He noted symptoms really never have improved since that last visit with [name], and now more concerns with some throat discomfort and ear pain and recent strep exposures in the family.  Had Covid in August, not yet have vaccine  Sx's started prior to last visit and not better since Family has had strep throat + cough, "badly" intermittently with minimal production and clear No marked SOB, minimal wheeze when coughs No fever, not feeling feverish + sore throat - starting to get sore and + ear pain, right  +mild congestion, + sinus pain above and more below the eyes and hurts to press on it No loss of smell, loss of taste no N/V No bad muscle aches No marked loose stools/diarrhea No CP, passing out episodes Has tried sudafed, alka seltzer cold and sinus, flonase and notes these [name] not  work,  Comorbid conditions reviewed + h/o asthma - not bad + h/o DM,  + heart disease hx, saw cardiologist recently and told ok + morbid obesity   Patient with multiple allergies noted including the following antibiotics: Sulfamethoxazole-trimethoprim, amoxicillin, Keflex  Tobacco-never smoker  Patient Active Problem List   Diagnosis Date Noted  . Stacey deficiency anemia due to chronic blood loss   . Polyp of ascending colon   . Chronic gastric ulcer with hemorrhage   . Type 2 diabetes with decreased circulation ([name]) 11/07/2018  . Vitamin B12 deficiency 08/08/2018  . Encounter for Medicare annual wellness exam 10/30/2017  . Patient is full code 10/30/2017  . Disorder of bursae of shoulder region 10/09/2017  . Coronary artery disease 10/09/2017  . Plantar fascial fibromatosis 10/09/2017  . Tendinitis of wrist 10/09/2017  . Schizoaffective disorder, bipolar type ([name]) 09/28/2017  . Chest pain 09/10/2017  . 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 Breast cancer screening 09/28/2016  . Fatigue 05/01/2016  . Adenomatous colon polyp 10/15/2015  . Bipolar affective disorder ([name]) 10/15/2015  . CN (constipation) 10/15/2015  . Drug-induced hepatic toxicity 10/15/2015  . Blood in feces 10/15/2015  . HLD (hyperlipidemia) 10/15/2015  . Hypoxemia 10/15/2015  . NASH (nonalcoholic steatohepatitis) 10/15/2015  . Morbid obesity ([name]) 10/15/2015  . Restless leg syndrome 10/15/2015  . Type 2 diabetes, uncontrolled, with neuropathy ([place]) 10/15/2015  . Elevated alkaline phosphatase level 09/22/2015  . Low back pain with  left-sided sciatica 08/18/2015  . Encounter for monitoring tricyclic antidepressant therapy 05/13/2015  . Bipolar disorder ([name]) 05/11/2015  . Apnea, sleep 05/05/2015  . Heart valve disease 05/05/2015  . Chronic kidney disease, stage III (moderate) ([name]) 03/15/2015  . Migraine 03/12/2015  . COPD (chronic obstructive pulmonary disease) ([place]) 01/20/2015   . 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[name]) 01/20/2015  . GERD (gastroesophageal reflux disease) 01/20/2015  . Hypothyroid   . Vitamin D deficiency   . Urinary incontinence   . Liver lesion   . Hypomagnesemia   . Obstructive sleep apnea 10/20/2013  . Vaginitis 12/19/2011  . PERIPHERAL NEUROPATHY 03/04/2009  . HEMORRHOIDS, EXTERNAL 09/03/2008  . ASTHMA, PERSISTENT, MODERATE 07/04/2007  . INTERSTITIAL CYSTITIS 07/04/2007  . Allergic rhinitis 07/04/2007  . [name] 06/19/2007    Past Surgical History:  Procedure Laterality Date  . ABDOMINAL HYSTERECTOMY     Partial  . bladder stim  2007  . BLADDER SURGERY     x 2. tacking of bladder and the other was the stimulator placement  . CHOLECYSTECTOMY    . COLONOSCOPY WITH PROPOFOL N/A 03/04/2019   Procedure: COLONOSCOPY WITH PROPOFOL;  Surgeon: Stacey Lame, [name];  [facility]: [name];  Service: Endoscopy;  Laterality: N/A;  . ESOPHAGOGASTRODUODENOSCOPY (EGD) WITH PROPOFOL N/A 03/04/2019   Procedure: ESOPHAGOGASTRODUODENOSCOPY (EGD) WITH PROPOFOL;  Surgeon: Stacey Lame, [name];  Location: [name];  Service: Endoscopy;  Laterality: N/A;  . INCISION AND DRAINAGE ABSCESS Right 04/16/2019   Procedure: INCISION AND DRAINAGE ABSCESS;  Surgeon: Stacey Sprague, [name];  Location: [name];  Service: General;  Laterality: Right;  . INTERSTIM-GENERATOR CHANGE N/A 12/31/2017   Procedure: INTERSTIM-GENERATOR CHANGE;  Surgeon: Stacey Loser, [name];  Location: [name];  Service: Urology;  Laterality: N/A;  . LIVER BIOPSY  March 2016   Negative  . LUNG BIOPSY  2015   small spot on lung  . TUBAL LIGATION    . UPPER GI ENDOSCOPY    . WRIST SURGERY Right 11/02/2019    Family History  Problem Relation Age of Onset  . Diabetes Mother   . Heart disease Mother   . Hyperlipidemia Mother   . Hypertension Mother   . Kidney disease Mother   . Mental illness Mother   . Hypothyroidism Mother   . Stroke Mother   . Osteoporosis Mother   .  Glaucoma Mother   . Congestive Heart Failure Mother   . Hypertension Father   . Asthma Daughter   . Cancer Daughter        throat  . Bipolar disorder Daughter   . Heart disease Maternal Grandfather   . Rheum arthritis Maternal Grandfather   . Cancer Maternal Grandfather        liver  . Kidney disease Maternal Grandfather   . Cancer Paternal Grandmother        lung  . Kidney disease Brother   . Breast cancer Maternal Grandmother 60  . Bipolar disorder Daughter   . Hypertension Daughter   . Restless legs syndrome Daughter     Social History   Tobacco Use  . Smoking status: Never Smoker  . Smokeless tobacco: Never Used  Substance Use Topics  . Alcohol use: No     Current Outpatient Medications:  .  albuterol (VENTOLIN HFA) 108 (90 Base) MCG/ACT inhaler, Inhale 2 puffs into the lungs every 6 (six) hours as needed for wheezing or shortness of breath., Disp: 18 g, Rfl: 3 .  atorvastatin (LIPITOR) 80 MG tablet, Take 80 mg by mouth  at bedtime. , Disp: , Rfl:  .  busPIRone (BUSPAR) 10 MG tablet, Take 20 mg by mouth 2 (two) times daily. , Disp: , Rfl:  .  citalopram (CELEXA) 20 MG tablet, Take 20 mg by mouth daily., Disp: , Rfl:  .  empagliflozin (JARDIANCE) 25 MG TABS tablet, Take 25 mg by mouth daily., Disp: , Rfl:  .  ezetimibe (ZETIA) 10 MG tablet, Take 10 mg by mouth daily. , Disp: , Rfl:  .  fenofibrate micronized (LOFIBRA) 134 MG capsule, Take 134 mg by mouth daily., Disp: , Rfl:  .  fluconazole (DIFLUCAN) 150 MG tablet, Take 1 tablet (150 mg total) by mouth every other day., Disp: 3 tablet, Rfl: 0 .  fluticasone (FLONASE) 50 MCG/ACT nasal spray, Place 2 sprays into both nostrils daily., Disp: 16 g, Rfl: 0 .  gabapentin (NEURONTIN) 300 MG capsule, Take 1-2 capsules (300-600 mg total) by mouth 3 (three) times daily. Take 1 capsule each morning, 1 capsule each afternoon, and 2 capsules at bedtime., Disp: 360 capsule, Rfl: 0 .  gabapentin (NEURONTIN) 600 MG tablet, Take 600 mg by  mouth daily., Disp: , Rfl:  .  insulin aspart (NOVOLOG FLEXPEN) 100 UNIT/ML FlexPen, Inject 3 Units subcutaneously. Sliding scale., Disp: , Rfl:  .  insulin regular human CONCENTRATED (HUMULIN R) 500 UNIT/ML injection, Inject 70 Units into the skin 3 (three) times daily with meals., Disp: , Rfl:  .  ipratropium (ATROVENT) 0.03 % nasal spray, Place 2 sprays into both nostrils every 12 (twelve) hours., Disp: 30 mL, Rfl: 1 .  lamoTRIgine (LAMICTAL) 200 MG tablet, Take 200 mg by mouth 2 (two) times daily. , Disp: , Rfl:  .  levothyroxine (SYNTHROID) 50 MCG tablet, TAKE ONE TABLET DAILY 3 DAYS A WEEK, THEN TAKE ONE AND ONE-HALF TABLETS FOUR DAYS A WEEK (Patient taking differently: Take 50 mcg by mouth See admin instructions. Take one tablet daily for 3 days a week, then 1 1/2 tablets 4 days a week), Disp: 108 tablet, Rfl: 0 .  lisinopril (ZESTRIL) 2.5 MG tablet, TAKE 1 TABLET BY MOUTH EVERY DAY, Disp: 30 tablet, Rfl: 0 .  metoprolol tartrate (LOPRESSOR) 25 MG tablet, Take 0.5 tablets (12.5 mg total) by mouth 2 (two) times daily., Disp: 30 tablet, Rfl: 5 .  ondansetron (ZOFRAN) 4 MG tablet, TAKE 1 TABLET BY MOUTH EVERY 8 HOURS AS NEEDED FOR NAUSEA OR VOMITING (Patient taking differently: Take 4 mg by mouth every 8 (eight) hours as needed for nausea or vomiting.), Disp: 20 tablet, Rfl: 2 .  pantoprazole (PROTONIX) 40 MG tablet, Take 1 tablet (40 mg total) by mouth daily., Disp: 90 tablet, Rfl: 3 .  polyethylene glycol (MIRALAX) 17 g packet, Take 17 g by mouth daily. (Patient taking differently: Take 17 g by mouth daily as needed for mild constipation.), Disp: 14 each, Rfl: 0 .  QUEtiapine (SEROQUEL) 200 MG tablet, Take 200 mg by mouth at bedtime. Along with 367m dose for a total of 5041m Disp: , Rfl:   Allergies  Allergen Reactions  . Mirtazapine Anaphylaxis    REACTION: closes throat  . Morphine And Related     anaphylaxis  . Sulfamethoxazole-Trimethoprim Rash    REACTION: [place] all over  .  Amoxicillin Er Nausea And Vomiting    Pt doesn't remember problem with amoxicillin -  SE Has patient had a PCN reaction causing immediate rash, facial/tongue/throat swelling, SOB or lightheadedness with hypotension: No Has patient had a PCN reaction causing severe rash involving mucus membranes  or skin necrosis: No Has patient had a PCN reaction that required hospitalization: No Has patient had a PCN reaction occurring within the last 10 years: Yes If all of the above answers are "NO", then may proceed with Cephalosporin use.  . Codeine Itching    REACTION: itch, rash  . Hydrocodone-Acetaminophen Rash    REACTION: rash  . Keflex [Cephalexin] Rash    Pt does not remember having a reaction or rash, no airway involvement  . Prasterone Nausea And Vomiting    With staff assistance, above reviewed with the patient today.  ROS: As per HPI, otherwise no specific complaints on a limited and focused system review   Objective  Virtual encounter, vitals not obtained.  There is no height or weight on file to calculate BMI.  Physical Exam   Appears in NAD via conversation Breathing: No obvious respiratory distress. Speaking in complete sentences Neurological: Pt is alert, Speech is normal Psychiatric: Patient has a normal mood and affect, behavior is normal. Judgment and thought content normal.   No results found for this or any previous visit (from the past 72 hour(s)).  PHQ2/9: Depression screen [facility] 2/9 08/17/2020 08/03/2020 07/13/2020 06/15/2020 01/26/2020  Decreased Interest 0 0 0 2 0  Down, Depressed, Hopeless - 0 0 2 0  PHQ - 2 Score 0 0 0 4 0  Altered sleeping 0 0 0 2 0  Tired, decreased energy 0 0 0 2 0  Change in appetite 2 0 0 2 0  Feeling bad or failure about yourself  0 0 0 0 0  Trouble concentrating 0 0 0 3 0  Moving slowly or fidgety/restless 0 0 0 1 0  Suicidal thoughts 0 0 0 0 0  PHQ-9 Score 2 0 0 14 0  Difficult doing work/chores Not difficult at all Not difficult at  all Not difficult at all Not difficult at all -  Some recent data might be hidden   PHQ-2/9 Result reviewed  Fall Risk: Fall Risk  08/17/2020 08/03/2020 07/13/2020 06/15/2020 01/26/2020  Yang in the past year? 0 0 0 1 0  Comment - - - - -  Number Yang in past yr: 0 0 0 1 0  Comment - - - - -  Injury with Fall? 0 0 0 0 0  Comment - - - - -  Risk for fall due to : - - - Impaired balance/gait;Impaired mobility;History of fall(s) -  Follow up - Yang evaluation completed Yang evaluation completed - -     Assessment & Plan  1. Subacute maxillary sinusitis 2. Otitis of right ear 3. Bronchitis Concern with the duration of symptoms, and more recently some sore throats beginning and ear pain in addition to the sinus pain and pressure she describes.  Has not responded to over-the-counter medications, and she has recent exposure to strep infections in the family as well.  Noted limitations with this being a phone visit. Also noted concerns with her multiple allergy history, although with amoxicillin it notes nausea and vomiting although with Keflex a rash noted. Also noted the possibility of Covid is still an issue, although she has had Covid before, and not feel like she did with that.  Has not yet had the immunization. Felt best to proceed as follows: We will add an antibiotic, and discussed options, and will use azithromycin in a Z-Pak as directed.  Also to use the Flonase product she has, twice daily for the next 3 days, then just use once daily  after. Would use a Robitussin product as well as needed, as an expectorant can be helpful and can help with the cough. Also can use Tylenol products as needed for any low-grade temps or discomfort Rest and increasing fluids emphasized. Should follow-up if symptoms not improving or more problematic as the week is progressing into the weekend, did discuss probably getting ENTs input if symptoms not improved despite the above measures. Also noted if  symptoms not improving or more problematic, does need to get a Covid test.  4. Type 2 diabetes with decreased circulation ([place]) 5.  Mild asthma history/chronic obstructive pulmonary disease, unspecified COPD type ([place]) Noted comorbidities above.   I discussed the assessment and treatment plan with the patient. The patient was provided an opportunity to ask questions and all were answered. The patient agreed with the plan and demonstrated an understanding of the instructions.  Red flags and when to present for emergency care or RTC including fevers, chest pain, shortness of breath, new/worsening/un-resolving symptoms reviewed with patient at time of visit.   The patient was advised to call back or seek an in-person evaluation if the symptoms worsen or if the condition fails to improve as anticipated.  I provided 20 minutes of non-face-to-face time during this encounter that included discussing at length patient's sx/history, pertinent pmhx, medications, treatment and follow up plan. This time also included the necessary documentation, orders, and chart review.  Towanda Malkin, [name]

## 2020-08-24 ENCOUNTER — Other Ambulatory Visit: Payer: Self-pay | Admitting: Family Medicine

## 2020-08-24 DIAGNOSIS — E038 Other specified hypothyroidism: Secondary | ICD-10-CM

## 2020-08-30 ENCOUNTER — Ambulatory Visit: Payer: Medicare HMO | Admitting: Cardiovascular Disease

## 2020-09-07 ENCOUNTER — Ambulatory Visit: Payer: Medicare HMO | Admitting: Gastroenterology

## 2020-09-07 ENCOUNTER — Other Ambulatory Visit: Payer: Self-pay | Admitting: Family Medicine

## 2020-09-07 ENCOUNTER — Ambulatory Visit: Payer: Medicare HMO | Admitting: Cardiovascular Disease

## 2020-09-14 NOTE — Progress Notes (Deleted)
Cardiology Office Note:    Date:  09/14/2020   ID:  [name], DOB Nov 01, 1965, MRN [id number]  PCP:  Steele Sizer, MD  [facility] Cardiologist:  Ida Rogue, MD  [facility] Electrophysiologist:  None   Referring MD: Steele Sizer, MD   Chief Complaint: 2 month f/u  History of Present Illness:    [name] is a 55 y.o. female with a hx of DM2, hypothyroidism, NASH, OSA on bipap, chronic dizziness, restless leg syndrome, COPD, chronic back pain, sinusitis, Recent COVD infection who presents with follow-up. In 2018 the patient had a chest CT that was unable to exclude mild CAD with mild LAD. In March 2021 she was seen and reported chest pain. Echo was ordered which showed LVEF 55-60%, NRWMA. The patient had covid in 04/2020 and was admittef ro 4 days. She was seen in October and was still requiring O2 since vodi infection. Also had atypical left-sided chest pain and palpitations. BB was restarted and echo was ordered, Echo showed preserved EF, NRWMA, mod LVH (?HOCM). She was last seen 06/25/20 and reported fatigue and atypical chest pain. Chest pain was improved since re-starting BB. Chest CT was discussed with cardiologist given CKD. Carotid US ordered for left sided bruit.   Today,    Chronic atypical chest  L sided Bruit Carotid US  CKD Plans to see nephrology  COPD on O2 - O2 sstill since covid infection  HLD Continue medicaitons  HpPEF Euvolemic on exam today  HTN BP Well controlled.   Past Medical History:  Diagnosis Date  . Anxiety   . Arthritis   . Asthma   . Bipolar depression ([place])   . CHF (congestive heart failure) ([place])   . Chronic headaches    migraines have lessened  . Chronic kidney disease    ddstage 111  . COPD (chronic obstructive pulmonary disease) ([place])   . Coronary artery disease   . Depression   . Diabetic neuropathy ([place])   . Dyspnea    uses inhalers for this  . Dysrhythmia    rapid  . Fatty liver   . GERD  (gastroesophageal reflux disease)   . Headache   . Heart attack ([place]) 2009  . Hyperlipidemia   . Hypertension   . Hypomagnesemia   . Hypothyroid   . Irregular heart rhythm   . Liver lesion    Favored to be benign on CT; MRI of abd w/contract in Aug 2016. Liver Biopsy-Negative, March 2016  . Morbid obesity ([place])   . OSA (obstructive sleep apnea)   . Peripheral vascular disease ([place])   . Pinched nerve    Back  . Pneumonia   . RLS (restless legs syndrome)   . Seasonal allergies   . Sleep apnea    uses oxygen at night 2L d/t cpap did not fit  . Type 2 diabetes, uncontrolled, with neuropathy ([place]) 10/15/2015   Overview:  Microalbumin 7.7 12/2010: Hgb A1c 10.2% 12/2010 and 9.7% 03/2011, eye exam 10/2009 [facility] DM clinic   . Urinary incontinence   . Vitamin D deficiency     Past Surgical History:  Procedure Laterality Date  . ABDOMINAL HYSTERECTOMY     Partial  . bladder stim  2007  . BLADDER SURGERY     x 2. tacking of bladder and the other was the stimulator placement  . CHOLECYSTECTOMY    . COLONOSCOPY WITH PROPOFOL N/A 03/04/2019   Procedure: COLONOSCOPY WITH PROPOFOL;  Surgeon: [name], MD;  Location: [facility]  ENDOSCOPY;  Service: Endoscopy;  Laterality: N/A;  . ESOPHAGOGASTRODUODENOSCOPY (EGD) WITH PROPOFOL N/A 03/04/2019   Procedure: ESOPHAGOGASTRODUODENOSCOPY (EGD) WITH PROPOFOL;  Surgeon: [name], MD;  Location: [facility] ENDOSCOPY;  Service: Endoscopy;  Laterality: N/A;  . INCISION AND DRAINAGE ABSCESS Right 04/16/2019   Procedure: INCISION AND DRAINAGE ABSCESS;  Surgeon: Benjamine Sprague, DO;  Location: [facility] ORS;  Service: General;  Laterality: Right;  . INTERSTIM-GENERATOR CHANGE N/A 12/31/2017   Procedure: INTERSTIM-GENERATOR CHANGE;  Surgeon: Bjorn Loser, MD;  Location: [facility] ORS;  Service: Urology;  Laterality: N/A;  . LIVER BIOPSY  March 2016   Negative  . LUNG BIOPSY  2015   small spot on lung  . TUBAL LIGATION    . UPPER GI ENDOSCOPY    . WRIST SURGERY Right  11/02/2019    Current Medications: No outpatient medications have been marked as taking for the 09/15/20 encounter (Appointment) with Arvil Chaco, PA-C.     Allergies:   Mirtazapine, Morphine and related, Sulfamethoxazole-trimethoprim, Amoxicillin er, Codeine, Hydrocodone-acetaminophen, Keflex [cephalexin], and Prasterone   Social History   Socioeconomic History  . Marital status: Married    Spouse name: Not on file  . Number of children: 2  . Years of education: 12  . Highest education level: Not on file  Occupational History  . Occupation: disabled  Tobacco Use  . Smoking status: Never Smoker  . Smokeless tobacco: Never Used  Vaping Use  . Vaping Use: Never used  Substance and Sexual Activity  . Alcohol use: No  . Drug use: No  . Sexual activity: Yes    Partners: Male    Birth control/protection: Surgical  Other Topics Concern  . Not on file  Social History Narrative   ** Merged History Encounter **       16-20 oz of caffeine a day       Pt lives with husband and grandchildren   Social Determinants of Health   Financial Resource Strain: Low Risk   . Difficulty of Paying Living Expenses: Not hard at all  Food Insecurity: No Food Insecurity  . Worried About Charity fundraiser in the Last Year: Never true  . Ran Out of Food in the Last Year: Never true  Transportation Needs: No Transportation Needs  . Lack of Transportation (Medical): No  . Lack of Transportation (Non-Medical): No  Physical Activity: Inactive  . Days of Exercise per Week: 0 days  . Minutes of Exercise per Session: 0 min  Stress: Stress Concern Present  . Feeling of Stress : Very much  Social Connections: Moderately Isolated  . Frequency of Communication with Friends and Family: More than three times a week  . Frequency of Social Gatherings with Friends and Family: More than three times a week  . Attends Religious Services: Never  . Active Member of Clubs or Organizations: No  .  Attends Archivist Meetings: Never  . Marital Status: Married     Family History: [name] ***family history includes Asthma in her daughter; Bipolar disorder in her daughter and daughter; Breast cancer (age of onset: 54) in her maternal grandmother; Cancer in her daughter, maternal grandfather, and paternal grandmother; Congestive Heart Failure in her mother; Diabetes in her mother; Glaucoma in her mother; Heart disease in her maternal grandfather and mother; Hyperlipidemia in her mother; Hypertension in her daughter, father, and mother; Hypothyroidism in her mother; Kidney disease in her brother, maternal grandfather, and mother; Mental illness in her mother; Osteoporosis in her mother; Restless legs  syndrome in her daughter; Rheum arthritis in her maternal grandfather; Stroke in her mother.  ROS:   Please see the history of present illness.    *** All other systems reviewed and are negative.  EKGs/Labs/Other Studies Reviewed:    The following studies were reviewed today: ***  EKG:  EKG is *** ordered today.  The ekg ordered today demonstrates ***  Recent Labs: 05/02/2020: Magnesium 1.9 05/18/2020: ALT 67; Hemoglobin 10.5; Platelets 203 06/25/2020: BUN 34; Creatinine, Ser 1.10; Potassium 4.0; Sodium 141  Recent Lipid Panel    Component Value Date/Time   CHOL 196 09/28/2016 1456   CHOL 196 09/22/2015 1339   CHOL 141 10/25/2013 0314   TRIG 856 (H) 09/28/2016 1456   TRIG 830 (H) 10/25/2013 0314   HDL 33 (L) 09/28/2016 1456   HDL 35 (L) 09/22/2015 1339   HDL 9 (L) 10/25/2013 0314   CHOLHDL 5.9 (H) 09/28/2016 1456   VLDL NOT CALC 09/28/2016 1456   VLDL SEE COMMENT 10/25/2013 0314   LDLCALC NOT CALC 09/28/2016 1456   LDLCALC 99 09/22/2015 1339   LDLCALC SEE COMMENT 10/25/2013 0314   LDLDIRECT 65.4 06/08/2008 1456     Risk Assessment/Calculations:   {Does this patient have ATRIAL FIBRILLATION?[phone]}   Physical Exam:    VS:  There were no vitals taken for  this visit.    Wt Readings from Last 3 Encounters:  08/03/20 201 lb 11.2 oz (91.5 kg)  06/25/20 186 lb 8 oz (84.6 kg)  06/15/20 187 lb 9.6 oz (85.1 kg)     GEN: *** Well nourished, well developed in no acute distress HEENT: Normal NECK: No JVD; No carotid bruits LYMPHATICS: No lymphadenopathy CARDIAC: ***RRR, no murmurs, rubs, gallops RESPIRATORY:  Clear to auscultation without rales, wheezing or rhonchi  ABDOMEN: Soft, non-tender, non-distended MUSCULOSKELETAL:  No edema; No deformity  SKIN: Warm and dry NEUROLOGIC:  Alert and oriented x 3 PSYCHIATRIC:  Normal affect   ASSESSMENT:    No diagnosis found. PLAN:    In order of problems listed above:  1. ***  Disposition: Follow up {follow up:15908} with ***   Shared Decision Making/Informed Consent   {Are you ordering a CV Procedure (e.g. stress test, cath, DCCV, TEE, etc)?   Press F2        :[phone]}    Signed, Katyana Trolinger Ninfa Meeker, PA-C  09/14/2020 2:50 PM    [facility]

## 2020-09-15 ENCOUNTER — Ambulatory Visit: Payer: Medicare Other | Admitting: Physician Assistant

## 2020-09-16 ENCOUNTER — Emergency Department: Payer: Medicare Other

## 2020-09-16 ENCOUNTER — Encounter: Payer: Self-pay | Admitting: Radiology

## 2020-09-16 ENCOUNTER — Emergency Department
Admission: EM | Admit: 2020-09-16 | Discharge: 2020-09-16 | Disposition: A | Discharge status: Home / Self Care | Payer: Medicare Other | Attending: Emergency Medicine | Admitting: Emergency Medicine

## 2020-09-16 ENCOUNTER — Other Ambulatory Visit: Payer: Self-pay

## 2020-09-16 DIAGNOSIS — J449 Chronic obstructive pulmonary disease, unspecified: Secondary | ICD-10-CM | POA: Diagnosis not present

## 2020-09-16 DIAGNOSIS — Z85828 Personal history of other malignant neoplasm of skin: Secondary | ICD-10-CM | POA: Diagnosis not present

## 2020-09-16 DIAGNOSIS — J1282 Pneumonia due to coronavirus disease 2019: Secondary | ICD-10-CM | POA: Diagnosis not present

## 2020-09-16 DIAGNOSIS — Z79899 Other long term (current) drug therapy: Secondary | ICD-10-CM | POA: Insufficient documentation

## 2020-09-16 DIAGNOSIS — E1122 Type 2 diabetes mellitus with diabetic chronic kidney disease: Secondary | ICD-10-CM | POA: Insufficient documentation

## 2020-09-16 DIAGNOSIS — I509 Heart failure, unspecified: Secondary | ICD-10-CM | POA: Diagnosis not present

## 2020-09-16 DIAGNOSIS — R0789 Other chest pain: Secondary | ICD-10-CM | POA: Diagnosis not present

## 2020-09-16 DIAGNOSIS — R0602 Shortness of breath: Secondary | ICD-10-CM | POA: Diagnosis not present

## 2020-09-16 DIAGNOSIS — N183 Chronic kidney disease, stage 3 unspecified: Secondary | ICD-10-CM | POA: Insufficient documentation

## 2020-09-16 DIAGNOSIS — I130 Hypertensive heart and chronic kidney disease with heart failure and stage 1 through stage 4 chronic kidney disease, or unspecified chronic kidney disease: Secondary | ICD-10-CM | POA: Insufficient documentation

## 2020-09-16 DIAGNOSIS — Z794 Long term (current) use of insulin: Secondary | ICD-10-CM | POA: Diagnosis not present

## 2020-09-16 DIAGNOSIS — R079 Chest pain, unspecified: Secondary | ICD-10-CM

## 2020-09-16 DIAGNOSIS — J45909 Unspecified asthma, uncomplicated: Secondary | ICD-10-CM | POA: Insufficient documentation

## 2020-09-16 DIAGNOSIS — E1159 Type 2 diabetes mellitus with other circulatory complications: Secondary | ICD-10-CM | POA: Insufficient documentation

## 2020-09-16 DIAGNOSIS — I2510 Atherosclerotic heart disease of native coronary artery without angina pectoris: Secondary | ICD-10-CM | POA: Insufficient documentation

## 2020-09-16 DIAGNOSIS — J189 Pneumonia, unspecified organism: Secondary | ICD-10-CM | POA: Diagnosis not present

## 2020-09-16 DIAGNOSIS — E039 Hypothyroidism, unspecified: Secondary | ICD-10-CM | POA: Insufficient documentation

## 2020-09-16 DIAGNOSIS — Z20822 Contact with and (suspected) exposure to covid-19: Secondary | ICD-10-CM | POA: Diagnosis not present

## 2020-09-16 DIAGNOSIS — E1140 Type 2 diabetes mellitus with diabetic neuropathy, unspecified: Secondary | ICD-10-CM | POA: Insufficient documentation

## 2020-09-16 LAB — CBC
HCT: 37.1 % (ref 36.0–46.0)
Hemoglobin: 11.8 g/dL — ABNORMAL LOW (ref 12.0–15.0)
MCH: 29.6 pg (ref 26.0–34.0)
MCHC: 31.8 g/dL (ref 30.0–36.0)
MCV: 93.2 fL (ref 80.0–100.0)
Platelets: 230 10*3/uL (ref 150–400)
RBC: 3.98 MIL/uL (ref 3.87–5.11)
RDW: 14.4 % (ref 11.5–15.5)
WBC: 6.2 10*3/uL (ref 4.0–10.5)
nRBC: 0 % (ref 0.0–0.2)

## 2020-09-16 LAB — BASIC METABOLIC PANEL
Anion gap: 12 (ref 5–15)
BUN: 20 mg/dL (ref 6–20)
CO2: 26 mmol/L (ref 22–32)
Calcium: 8.8 mg/dL — ABNORMAL LOW (ref 8.9–10.3)
Chloride: 106 mmol/L (ref 98–111)
Creatinine, Ser: 1.03 mg/dL — ABNORMAL HIGH (ref 0.44–1.00)
GFR, Estimated: >60 mL/min (ref >60)
Glucose, Bld: 180 mg/dL — ABNORMAL HIGH (ref 70–99)
Potassium: 4.1 mmol/L (ref 3.5–5.1)
Sodium: 144 mmol/L (ref 135–145)

## 2020-09-16 LAB — BRAIN NATRIURETIC PEPTIDE: B Natriuretic Peptide: 55.1 pg/mL (ref 0.0–100.0)

## 2020-09-16 LAB — TROPONIN I (HIGH SENSITIVITY)
Troponin I (High Sensitivity): 6 ng/L (ref <18)
Troponin I (High Sensitivity): 6 ng/L (ref <18)

## 2020-09-16 LAB — MAGNESIUM: Magnesium: 1.6 mg/dL — ABNORMAL LOW (ref 1.7–2.4)

## 2020-09-16 LAB — RESP PANEL BY RT-PCR (FLU A&B, COVID) ARPGX2
Influenza A by PCR: NEGATIVE
Influenza B by PCR: NEGATIVE
SARS Coronavirus 2 by RT PCR: NEGATIVE

## 2020-09-16 LAB — PROCALCITONIN: Procalcitonin: <0.1 ng/mL

## 2020-09-16 MED ORDER — IOHEXOL 350 MG/ML SOLN
75.0000 mL | Freq: Once | INTRAVENOUS | Status: AC | PRN
  Administered 2020-09-16: 75 mL via INTRAVENOUS
  Filled 2020-09-16: qty 75

## 2020-09-16 MED ORDER — MAGNESIUM SULFATE 2 GM/50ML IV SOLN
2.0000 g | Freq: Once | INTRAVENOUS | Status: AC
  Administered 2020-09-16: 2 g via INTRAVENOUS

## 2020-09-16 MED ORDER — PROCHLORPERAZINE EDISYLATE 10 MG/2ML IJ SOLN
10.0000 mg | Freq: Once | INTRAMUSCULAR | Status: AC
  Administered 2020-09-16: 10 mg via INTRAVENOUS
  Filled 2020-09-16: qty 2

## 2020-09-16 MED ORDER — ACETAMINOPHEN 500 MG PO TABS
1000.0000 mg | ORAL_TABLET | Freq: Once | ORAL | Status: AC
  Administered 2020-09-16: 1000 mg via ORAL
  Filled 2020-09-16: qty 2

## 2020-09-16 NOTE — ED Notes (Signed)
Two unsuccessful IV attempts. Will inform MD.

## 2020-09-16 NOTE — ED Notes (Signed)
Magnesium ran over 20 minutes per Dr. Thompson Caul verbal order.

## 2020-09-16 NOTE — ED Notes (Signed)
Patient taken to CT scan. Patient is alert and oriented. Patient is cooperative with interventions. No dyspnea noted.

## 2020-09-16 NOTE — ED Notes (Signed)
Patient ambulated to and from hallway bathroom with a steady gait. Patient had mild dyspnea with exertion that resolved quickly when back on the stretcher.

## 2020-09-16 NOTE — ED Provider Notes (Signed)
[facility] Emergency Department Provider Note  ____________________________________________   Event Date/Time   First MD Initiated Contact with Patient 09/16/20 1554     (approximate)  I have reviewed the triage vital signs and the nursing notes.   HISTORY  Chief Complaint Chest Pain, Shortness of Breath, and Headache   HPI [name] is a 55 y.o. female with a past medical history of anxiety, arthritis, bipolar disorder, CHF, chronic migraine headaches, COPD, CAD, DM, GERD, fatty liver disease, HTN, HDL, OSA on 2 to 3 L nasal cannula at night without any positive pressure support and no oxygen during the day, and RLS who presents for assessment of some right-sided chest discomfort associated with some slight shortness of breath and headache that began last night.  Patient states her chest discomfort radiates into her right arm and up her right neck.  She denies any cough, fevers, earache, sore throat, eye pain, vertigo, vision changes, abdominal pain, nausea, vomiting, diarrhea, dysuria, back pain, rash, focal extremity pain weakness numbness or tingling or any other acute complaints.  No recent falls or injuries.  She denies any history of tobacco abuse, EtOH use or illicit drug use.  She is not sure why she was diagnosed with COPD.       Past Medical History:  Diagnosis Date  . Anxiety   . Arthritis   . Asthma   . Bipolar depression ([place])   . CHF (congestive heart failure) ([place])   . Chronic headaches    migraines have lessened  . Chronic kidney disease    ddstage 111  . COPD (chronic obstructive pulmonary disease) ([place])   . Coronary artery disease   . Depression   . Diabetic neuropathy ([place])   . Dyspnea    uses inhalers for this  . Dysrhythmia    rapid  . Fatty liver   . GERD (gastroesophageal reflux disease)   . Headache   . Heart attack ([place]) 2009  . Hyperlipidemia   . Hypertension   . Hypomagnesemia   . Hypothyroid   . Irregular  heart rhythm   . Liver lesion    Favored to be benign on CT; MRI of abd w/contract in Aug 2016. Liver Biopsy-Negative, March 2016  . Morbid obesity ([place])   . OSA (obstructive sleep apnea)   . Peripheral vascular disease ([place])   . Pinched nerve    Back  . Pneumonia   . RLS (restless legs syndrome)   . Seasonal allergies   . Sleep apnea    uses oxygen at night 2L d/t cpap did not fit  . Type 2 diabetes, uncontrolled, with neuropathy ([place]) 10/15/2015   Overview:  Microalbumin 7.7 12/2010: Hgb A1c 10.2% 12/2010 and 9.7% 03/2011, eye exam 10/2009 [facility] DM clinic   . Urinary incontinence   . Vitamin D deficiency     Patient Active Problem List   Diagnosis Date Noted  . Iron deficiency anemia due to chronic blood loss   . Polyp of ascending colon   . Chronic gastric ulcer with hemorrhage   . Type 2 diabetes with decreased circulation ([place]) 11/07/2018  . Vitamin B12 deficiency 08/08/2018  . Encounter for Medicare annual wellness exam 10/30/2017  . Patient is full code 10/30/2017  . Disorder of bursae of shoulder region 10/09/2017  . Coronary artery disease 10/09/2017  . Plantar fascial fibromatosis 10/09/2017  . Tendinitis of wrist 10/09/2017  . Schizoaffective disorder, bipolar type ([place]) 09/28/2017  . Chest pain 09/10/2017  .  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 Breast cancer screening 09/28/2016  . Fatigue 05/01/2016  . Adenomatous colon polyp 10/15/2015  . Bipolar affective disorder ([place]) 10/15/2015  . CN (constipation) 10/15/2015  . Drug-induced hepatic toxicity 10/15/2015  . Blood in feces 10/15/2015  . HLD (hyperlipidemia) 10/15/2015  . Hypoxemia 10/15/2015  . NASH (nonalcoholic steatohepatitis) 10/15/2015  . Morbid obesity ([place]) 10/15/2015  . Restless leg syndrome 10/15/2015  . Type 2 diabetes, uncontrolled, with neuropathy ([place]) 10/15/2015  . Elevated alkaline phosphatase level 09/22/2015  . Low back pain with left-sided sciatica 08/18/2015  .  Encounter for monitoring tricyclic antidepressant therapy 05/13/2015  . Bipolar disorder ([place]) 05/11/2015  . Apnea, sleep 05/05/2015  . Heart valve disease 05/05/2015  . Chronic kidney disease, stage III (moderate) (HCC) 03/15/2015  . Migraine 03/12/2015  . COPD (chronic obstructive pulmonary disease) ([place]) 01/20/2015  . 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[place]) 01/20/2015  . GERD (gastroesophageal reflux disease) 01/20/2015  . Hypothyroid   . Vitamin D deficiency   . Urinary incontinence   . Liver lesion   . Hypomagnesemia   . Obstructive sleep apnea 10/20/2013  . Vaginitis 12/19/2011  . PERIPHERAL NEUROPATHY 03/04/2009  . HEMORRHOIDS, EXTERNAL 09/03/2008  . ASTHMA, PERSISTENT, MODERATE 07/04/2007  . INTERSTITIAL CYSTITIS 07/04/2007  . Allergic rhinitis 07/04/2007  . HYPERTENSION, BENIGN ESSENTIAL 06/19/2007    Past Surgical History:  Procedure Laterality Date  . ABDOMINAL HYSTERECTOMY     Partial  . bladder stim  2007  . BLADDER SURGERY     x 2. tacking of bladder and the other was the stimulator placement  . CHOLECYSTECTOMY    . COLONOSCOPY WITH PROPOFOL N/A 03/04/2019   Procedure: COLONOSCOPY WITH PROPOFOL;  Surgeon: [name], MD;  Location[facility] ENDOSCOPY;  Service: Endoscopy;  Laterality: N/A;  . ESOPHAGOGASTRODUODENOSCOPY (EGD) WITH PROPOFOL N/A 03/04/2019   Procedure: ESOPHAGOGASTRODUODENOSCOPY (EGD) WITH PROPOFOL;  Surgeon: [name], MD;  Location: [facility] ENDOSCOPY;  Service: Endoscopy;  Laterality: N/A;  . INCISION AND DRAINAGE ABSCESS Right 04/16/2019   Procedure: INCISION AND DRAINAGE ABSCESS;  Surgeon: Benjamine Sprague, DO;  Location: [facility] ORS;  Service: General;  Laterality: Right;  . INTERSTIM-GENERATOR CHANGE N/A 12/31/2017   Procedure: INTERSTIM-GENERATOR CHANGE;  Surgeon: Bjorn Loser, MD;  Location: [facility] ORS;  Service: Urology;  Laterality: N/A;  . LIVER BIOPSY  March 2016   Negative  . LUNG BIOPSY  2015   small spot on lung  . TUBAL LIGATION    . UPPER GI  ENDOSCOPY    . WRIST SURGERY Right 11/02/2019    Prior to Admission medications   Medication Sig Start Date End Date Taking? Authorizing Provider  albuterol (VENTOLIN HFA) 108 (90 Base) MCG/ACT inhaler Inhale 2 puffs into the lungs every 6 (six) hours as needed for wheezing or shortness of breath. 08/12/19   Hubbard Hartshorn, FNP  atorvastatin (LIPITOR) 80 MG tablet Take 80 mg by mouth at bedtime.     [provider]  azithromycin (ZITHROMAX) 250 MG tablet Take 2 tablets on day 1, then 1 tablet daily for the next 4 days 08/17/20   Towanda Malkin, MD  busPIRone (BUSPAR) 10 MG tablet Take 20 mg by mouth 2 (two) times daily.     [provider]  citalopram (CELEXA) 20 MG tablet Take 20 mg by mouth daily. 01/16/20   [provider]  empagliflozin (JARDIANCE) 25 MG TABS tablet Take 25 mg by mouth daily.    [provider]  ezetimibe (  ZETIA) 10 MG tablet Take 10 mg by mouth daily.  01/05/17   [provider]  fenofibrate micronized (LOFIBRA) 134 MG capsule Take 134 mg by mouth daily.    [provider]  fluconazole (DIFLUCAN) 150 MG tablet Take 1 tablet (150 mg total) by mouth every other day. 07/13/20   [name], PA-C  fluticasone (FLONASE) 50 MCG/ACT nasal spray Place 2 sprays into both nostrils daily. 07/13/20   [name], PA-C  gabapentin (NEURONTIN) 300 MG capsule Take 1-2 capsules (300-600 mg total) by mouth 3 (three) times daily. Take 1 capsule each morning, 1 capsule each afternoon, and 2 capsules at bedtime. 03/17/20   Steele Sizer, MD  gabapentin (NEURONTIN) 600 MG tablet Take 600 mg by mouth daily. 06/15/20   [provider]  insulin aspart (NOVOLOG FLEXPEN) 100 UNIT/ML FlexPen Inject 3 Units subcutaneously. Sliding scale.    [provider]  insulin regular human CONCENTRATED (HUMULIN R) 500 UNIT/ML injection Inject 70 Units into the skin 3 (three) times daily with meals.    [provider]   ipratropium (ATROVENT) 0.03 % nasal spray Place 2 sprays into both nostrils every 12 (twelve) hours. 08/03/20   [name], PA-C  lamoTRIgine (LAMICTAL) 200 MG tablet Take 200 mg by mouth 2 (two) times daily.     [provider]  levothyroxine (SYNTHROID) 50 MCG tablet TAKE ONE TABLET DAILY 3 DAYS A WEEK, THEN TAKE ONE AND ONE-HALF TABLETS FOUR DAYS A WEEK 08/24/20   Sowles, Drue Stager, MD  lisinopril (ZESTRIL) 2.5 MG tablet TAKE 1 TABLET BY MOUTH EVERY DAY 09/07/20   Steele Sizer, MD  metoprolol tartrate (LOPRESSOR) 25 MG tablet Take 0.5 tablets (12.5 mg total) by mouth 2 (two) times daily. 06/08/20 09/06/20  Marrianne Mood D, PA-C  ondansetron (ZOFRAN) 4 MG tablet TAKE 1 TABLET BY MOUTH EVERY 8 HOURS AS NEEDED FOR NAUSEA OR VOMITING Patient taking differently: Take 4 mg by mouth every 8 (eight) hours as needed for nausea or vomiting. 09/15/19   [name], MD  pantoprazole (PROTONIX) 40 MG tablet Take 1 tablet (40 mg total) by mouth daily. 08/20/19   [name], MD  polyethylene glycol (MIRALAX) 17 g packet Take 17 g by mouth daily. Patient taking differently: Take 17 g by mouth daily as needed for mild constipation. 10/27/19   Nance Pear, MD  QUEtiapine (SEROQUEL) 200 MG tablet Take 200 mg by mouth at bedtime. Along with 379m dose for a total of 502m   [provider]    Allergies Mirtazapine, Morphine and related, Sulfamethoxazole-trimethoprim, Amoxicillin er, Codeine, Hydrocodone-acetaminophen, Keflex [cephalexin], and Prasterone  Family History  Problem Relation Age of Onset  . Diabetes Mother   . Heart disease Mother   . Hyperlipidemia Mother   . Hypertension Mother   . Kidney disease Mother   . Mental illness Mother   . Hypothyroidism Mother   . Stroke Mother   . Osteoporosis Mother   . Glaucoma Mother   . Congestive Heart Failure Mother   . Hypertension Father   . Asthma Daughter   . Cancer Daughter        throat  . Bipolar disorder Daughter    . Heart disease Maternal Grandfather   . Rheum arthritis Maternal Grandfather   . Cancer Maternal Grandfather        liver  . Kidney disease Maternal Grandfather   . Cancer Paternal Grandmother        lung  . Kidney disease Brother   .  Breast cancer Maternal Grandmother 60  . Bipolar disorder Daughter   . Hypertension Daughter   . Restless legs syndrome Daughter     Social History Social History   Tobacco Use  . Smoking status: Never Smoker  . Smokeless tobacco: Never Used  Vaping Use  . Vaping Use: Never used  Substance Use Topics  . Alcohol use: No  . Drug use: No    Review of Systems  Review of Systems  Constitutional: Negative for chills and fever.  HENT: Negative for sore throat.   Eyes: Negative for pain.  Respiratory: Positive for shortness of breath. Negative for cough and stridor.   Cardiovascular: Positive for chest pain.  Gastrointestinal: Negative for vomiting.  Genitourinary: Negative for dysuria.  Musculoskeletal: Negative for myalgias.  Skin: Negative for rash.  Neurological: Positive for headaches. Negative for seizures and loss of consciousness.  Psychiatric/Behavioral: Negative for suicidal ideas.  All other systems reviewed and are negative.     ____________________________________________   PHYSICAL EXAM:  VITAL SIGNS: ED Triage Vitals  Enc Vitals Group     BP 09/16/20 1326 129/78     Pulse Rate 09/16/20 1326 92     Resp 09/16/20 1326 20     Temp 09/16/20 1326 98.2 F (36.8 C)     Temp Source 09/16/20 1326 Oral     SpO2 09/16/20 1326 98 %     Weight 09/16/20 1330 200 lb (90.7 kg)     Height 09/16/20 1330 4' 11"  (1.499 m)     Head Circumference --      Peak Flow --      Pain Score 09/16/20 1330 7     Pain Loc --      Pain Edu? --      Excl. in [place]? --    Vitals:   09/16/20 1326 09/16/20 1824  BP: 129/78 (!) 146/77  Pulse: 92 90  Resp: 20 20  Temp: 98.2 F (36.8 C)   SpO2: 98% 94%   Physical Exam Vitals and nursing note  reviewed.  Constitutional:      General: She is not in acute distress.    Appearance: She is well-developed and well-nourished.  HENT:     Head: Normocephalic and atraumatic.  Eyes:     Conjunctiva/sclera: Conjunctivae normal.  Cardiovascular:     Rate and Rhythm: Normal rate and regular rhythm.     Heart sounds: No murmur heard.   Pulmonary:     Effort: Pulmonary effort is normal. No respiratory distress.     Breath sounds: Normal breath sounds.  Abdominal:     Palpations: Abdomen is soft.     Tenderness: There is no abdominal tenderness.  Musculoskeletal:        General: No edema.     Cervical back: Neck supple.  Skin:    General: Skin is warm and dry.  Neurological:     Mental Status: She is alert.  Psychiatric:        Mood and Affect: Mood and affect normal.     Oropharynx unremarkable.  Cranial nerves II through XII grossly intact.  Patient has symmetric strength all extremities and is observed to ambulate with steady gait unassisted.  PERRLA.  EOMI. ____________________________________________   LABS (all labs ordered are listed, but only abnormal results are displayed)  Labs Reviewed  BASIC METABOLIC PANEL - Abnormal; Notable for the following components:      Result Value   Glucose, Bld 180 (*)    Creatinine, Ser 1.03 (*)  Calcium 8.8 (*)    All other components within normal limits  CBC - Abnormal; Notable for the following components:   Hemoglobin 11.8 (*)    All other components within normal limits  MAGNESIUM - Abnormal; Notable for the following components:   Magnesium 1.6 (*)    All other components within normal limits  RESP PANEL BY RT-PCR (FLU A&B, COVID) ARPGX2  BRAIN NATRIURETIC PEPTIDE  PROCALCITONIN  TROPONIN I (HIGH SENSITIVITY)  TROPONIN I (HIGH SENSITIVITY)   ____________________________________________  EKG  Sinus rhythm with a ventricular rate of 92, left axis deviation, unremarkable intervals, and some nonspecific ST changes in  anterior and inferior as well as lateral leads versus artifact. ____________________________________________  RADIOLOGY  ED MD interpretation: No focal consolidation, overt edema, large effusion, no fracture or other clear acute intrathoracic process.  There are some bilateral bronchial thickening could be suggestive of bronchitis  Official radiology report(s): DG Chest 2 View  Result Date: 09/16/2020 CLINICAL DATA:  Chest pain and shortness of breath. EXAM: CHEST - 2 VIEW COMPARISON:  04/28/2020 FINDINGS: Heart size is normal. Central bronchial thickening suggesting bronchitis. No infiltrate, collapse or effusion. No acute bone finding. IMPRESSION: Possible bronchitis. No consolidation or collapse. Electronically Signed   By: Nelson Chimes M.D.   On: 09/16/2020 14:20   CT Angio Chest PE W and/or Wo Contrast  Result Date: 09/16/2020 CLINICAL DATA:  PE suspected, high prob Chest pain.  Pain radiates down right arm. EXAM: CT ANGIOGRAPHY CHEST WITH CONTRAST TECHNIQUE: Multidetector CT imaging of the chest was performed using the standard protocol during bolus administration of intravenous contrast. Multiplanar CT image reconstructions and MIPs were obtained to evaluate the vascular anatomy. CONTRAST:  65m OMNIPAQUE IOHEXOL 350 MG/ML SOLN COMPARISON:  Radiograph earlier today.  Chest CT 04/28/2020 FINDINGS: Cardiovascular: There are no filling defects within the pulmonary arteries to suggest pulmonary embolus. Thoracic aorta is normal in caliber. No dissection or acute aortic findings. Minimal aortic atherosclerosis. Heart is upper normal in size. There are coronary artery calcifications. No pericardial effusion. Mediastinum/Nodes: Previous prominent lymph nodes have improved in the interim. There is no mediastinal or hilar adenopathy. Decompressed esophagus. Lungs/Pleura: Bilateral COVID pneumonia has resolved from prior exam. No scarring or residual. No acute airspace disease. No pleural fluid. No  findings of pulmonary edema. Trachea and central bronchi are patent. Upper Abdomen: Suspected hepatic steatosis. Cholecystectomy. Ingested material distends the stomach. Musculoskeletal: There are no acute or suspicious osseous abnormalities. Mild midthoracic spondylosis with spurring. Review of the MIP images confirms the above findings. IMPRESSION: 1. No pulmonary embolus or acute intrathoracic abnormality. 2. Coronary artery calcifications. Aortic Atherosclerosis (ICD10-I70.0). Electronically Signed   By: MKeith RakeM.D.   On: 09/16/2020 18:21    ____________________________________________   PROCEDURES  Procedure(s) performed (including Critical Care):  .1-3 Lead EKG Interpretation Performed by: SLucrezia Starch MD Authorized by: SLucrezia Starch MD     Interpretation: normal     ECG rate assessment: normal     Rhythm: sinus rhythm     Ectopy: none     Conduction: normal       ____________________________________________   INITIAL IMPRESSION / ASSESSMENT AND PLAN / ED COURSE      Patient presents with above-stated exam for assessment of some right-sided chest discomfort rating to her right neck and arm associated with some shortness of breath and headache that began last night.  Patient is afebrile hemodynamically stable on arrival.  Exam as above remarkable for no focal neurological deficits  lungs clear bilaterally.  No wheezing  Differential includes but is not limited to pneumonia, pneumothorax, acute heart failure exacerbation, ACS, anemia, arrhythmia, PE, pleurisy, and viral syndrome i.e. COVID.  Headache seems very typical of her usual migraine headaches and she has no focal deficit suggest CVA.  No findings on history or exam to suggest meningitis or other deep space infection in the head or neck.  No temporal tenderness to suggest temporal arteritis.  Chest x-ray has no evidence of pneumothorax, pneumonia, effusion or significant edema.  Her EKG does have some  nonspecific changes low suspicion for ACS at this time given to nonelevated troponins obtained over 2 hours which are also not consistent with myocarditis.  No significant arrhythmia identified.  BMP remarkable for hyperglycemia with a glucose of 180 consistent with patient's known history of poorly controlled diabetes with no other significant electrolyte or metabolic derangements.  No evidence of acidosis or DKA.  CBC shows no leukocytosis and hemoglobin close to baseline at 11.8 compared to 10.54 months ago.  Low suspicion for acute symptomatic anemia.  BNP is not elevated and not suggestive of heart failure.  Procalcitonin is undetectable and given absence of fever or elevated white blood cell count or findings on chest x-ray suggestive of bacterial pneumonia low suspicion for acute bacterial infectious process at this time.  Magnesium is 1.6.  This was repleted.  CT obtained to assess for any evidence of possible PE remarkable for no evidence of PE, pneumonia, pneumothorax, edema, pericardial effusion, or other acute intrathoracic process.  COVID PCR sent.  Given stable vital signs with reassuring exam and work-up believe patient is safe for discharge with plan for continued outpatient management.  Differential still includes some pleurisy versus musculoskeletal chest wall discomfort.  Advise close follow-up with PCP.  Discharged stable condition  ____________________________________________   FINAL CLINICAL IMPRESSION(S) / ED DIAGNOSES  Final diagnoses:  Chest pain, unspecified type  SOB (shortness of breath)  Hypomagnesemia    Medications  magnesium sulfate IVPB 2 g 50 mL (2 g Intravenous New Bag/Given 09/16/20 1836)  acetaminophen (TYLENOL) tablet 1,000 mg (1,000 mg Oral Given 09/16/20 1825)  prochlorperazine (COMPAZINE) injection 10 mg (10 mg Intravenous Given 09/16/20 1826)  iohexol (OMNIPAQUE) 350 MG/ML injection 75 mL (75 mLs Intravenous Contrast Given 09/16/20 6073)     ED Discharge  Orders    None       Note:  This document was prepared using Dragon voice recognition software and may include unintentional dictation errors.   Lucrezia Starch, MD 09/16/20 [id number]

## 2020-09-16 NOTE — ED Triage Notes (Signed)
Pt arrives to ed via pov from home. ambulatory to triage. Pt reports started having cp last night, causing her to feel SOB. Pt became winded after walking to triage but oxygen saturation was at 98%. Pain radiating down right arm and up into neck. Also report headache. Denies any visual changes. NAD noted at this time

## 2020-09-21 ENCOUNTER — Ambulatory Visit: Payer: Medicare Other | Admitting: Physician Assistant

## 2020-09-22 ENCOUNTER — Other Ambulatory Visit: Payer: Self-pay | Admitting: Gastroenterology

## 2020-09-24 DIAGNOSIS — E1165 Type 2 diabetes mellitus with hyperglycemia: Secondary | ICD-10-CM | POA: Diagnosis not present

## 2020-09-24 DIAGNOSIS — K760 Fatty (change of) liver, not elsewhere classified: Secondary | ICD-10-CM | POA: Diagnosis not present

## 2020-09-24 DIAGNOSIS — E039 Hypothyroidism, unspecified: Secondary | ICD-10-CM | POA: Diagnosis not present

## 2020-09-24 DIAGNOSIS — D631 Anemia in chronic kidney disease: Secondary | ICD-10-CM | POA: Diagnosis not present

## 2020-09-28 ENCOUNTER — Ambulatory Visit: Payer: Medicare HMO

## 2020-09-29 ENCOUNTER — Ambulatory Visit: Payer: Medicare Other | Admitting: Physician Assistant

## 2020-10-01 DIAGNOSIS — E039 Hypothyroidism, unspecified: Secondary | ICD-10-CM | POA: Diagnosis not present

## 2020-10-01 DIAGNOSIS — I10 Essential (primary) hypertension: Secondary | ICD-10-CM | POA: Diagnosis not present

## 2020-10-01 DIAGNOSIS — D631 Anemia in chronic kidney disease: Secondary | ICD-10-CM | POA: Diagnosis not present

## 2020-10-01 DIAGNOSIS — E1165 Type 2 diabetes mellitus with hyperglycemia: Secondary | ICD-10-CM | POA: Diagnosis not present

## 2020-10-09 NOTE — Progress Notes (Signed)
Date:  10/13/2020   ID:  [name], DOB 08/12/1966, MRN [id number]  Patient Location:  [address] [place] [address]   Provider location:   [facility], [place] office  PCP:  Steele Sizer, MD  Cardiologist:  Patsy Baltimore  Chief Complaint  Patient presents with  . Other    3 month follow up. Patient c.o chest pain and SOB at times and swelling in ankles. Patient thinks she may have PAD. Meds reviewed verbally with patient.     History of Present Illness:    [name] is a 55 y.o. female  past medical history of uncontrolled T2DM,  Hypothyroidism,  NASH,  Restless Leg Syndrome Chronic back pain Sinusitis depression Who presents for f/u of her  palpitations, chest pain  Recent evaluation in the ER 09/16/2020: chest pain/abdominal pain Records reviewed CT chest : neg Cardiac enzymes negative  Reports long history of abdominal pain EGD 02/2019, Oozing gastric ulcer.  Feels she is having the same sx as before "something eating its way out" Does not appear that she is on PPI Reports that she has follow-up with Dr. Allen Norris  CT scan chest, reviewed images pulled up  coronary calification LAD, moderate  Reports that she has cut down on eating, HBA1C oct 2021: 9.6  BP spikes when stressed, chest tight with episodes On prior clinic visits have reported having chest pain symptoms No prior ischemic work-up  Continues on metoprolol for tachycardia Husband does much of the ADLs   Dr. Rhona Leavens monitors her diabetes numbers  EKG personally reviewed by myself on todays visit Shows normal sinus rhythm rate 85 bpm no significant ST-T wave changes  ER 06/26/2017: chest pain burning central chest pain which is nonradiating, going on since this morning. Constant for the past 8-9 hours. Worse lying flat, better  sitting upright.  somewhat worsened by deep breathing. Not exertional. No fevers chills or cough. Mild to moderate  intensity.  CT scan chest September 2018 reviewed with her in detail Unable to exclude mild coronary calcification mid LAD otherwise no aortic atherosclerosis   Prior CV studies:   The following studies were reviewed today:   Past Medical History:  Diagnosis Date  . Anxiety   . Arthritis   . Asthma   . Bipolar depression ([place])   . CHF (congestive heart failure) ([place])   . Chronic headaches    migraines have lessened  . Chronic kidney disease    ddstage 111  . COPD (chronic obstructive pulmonary disease) ([place])   . Coronary artery disease   . Depression   . Diabetic neuropathy ([place])   . Dyspnea    uses inhalers for this  . Dysrhythmia    rapid  . Fatty liver   . GERD (gastroesophageal reflux disease)   . Headache   . Heart attack ([place]) 2009  . Hyperlipidemia   . Hypertension   . Hypomagnesemia   . Hypothyroid   . Irregular heart rhythm   . Liver lesion    Favored to be benign on CT; MRI of abd w/contract in Aug 2016. Liver Biopsy-Negative, March 2016  . Morbid obesity ([place])   . OSA (obstructive sleep apnea)   . Peripheral vascular disease ([place])   . Pinched nerve    Back  . Pneumonia   . RLS (restless legs syndrome)   . Seasonal allergies   . Sleep apnea    uses oxygen at night 2L d/t cpap did  not fit  . Type 2 diabetes, uncontrolled, with neuropathy ([place]) 10/15/2015   Overview:  Microalbumin 7.7 12/2010: Hgb A1c 10.2% 12/2010 and 9.7% 03/2011, eye exam 10/2009 [facility] DM clinic   . Urinary incontinence   . Vitamin D deficiency    Past Surgical History:  Procedure Laterality Date  . ABDOMINAL HYSTERECTOMY     Partial  . bladder stim  2007  . BLADDER SURGERY     x 2. tacking of bladder and the other was the stimulator placement  . CHOLECYSTECTOMY    . COLONOSCOPY WITH PROPOFOL N/A 03/04/2019   Procedure: COLONOSCOPY WITH PROPOFOL;  Surgeon: [name], MD;  Location: [facility] ENDOSCOPY;  Service: Endoscopy;  Laterality: N/A;  . ESOPHAGOGASTRODUODENOSCOPY (EGD) WITH  PROPOFOL N/A 03/04/2019   Procedure: ESOPHAGOGASTRODUODENOSCOPY (EGD) WITH PROPOFOL;  Surgeon: [name], MD;  Location: [facility] ENDOSCOPY;  Service: Endoscopy;  Laterality: N/A;  . INCISION AND DRAINAGE ABSCESS Right 04/16/2019   Procedure: INCISION AND DRAINAGE ABSCESS;  Surgeon: Benjamine Sprague, DO;  Location: [facility] ORS;  Service: General;  Laterality: Right;  . INTERSTIM-GENERATOR CHANGE N/A 12/31/2017   Procedure: INTERSTIM-GENERATOR CHANGE;  Surgeon: Bjorn Loser, MD;  Location: [facility] ORS;  Service: Urology;  Laterality: N/A;  . LIVER BIOPSY  March 2016   Negative  . LUNG BIOPSY  2015   small spot on lung  . TUBAL LIGATION    . UPPER GI ENDOSCOPY    . WRIST SURGERY Right 11/02/2019      Allergies:   Mirtazapine, Morphine and related, Sulfamethoxazole-trimethoprim, Amoxicillin er, Codeine, Hydrocodone-acetaminophen, Keflex [cephalexin], and Prasterone   Social History   Tobacco Use  . Smoking status: Never Smoker  . Smokeless tobacco: Never Used  Vaping Use  . Vaping Use: Never used  Substance Use Topics  . Alcohol use: No  . Drug use: No     Current Outpatient Medications on File Prior to Visit  Medication Sig Dispense Refill  . atorvastatin (LIPITOR) 80 MG tablet Take 80 mg by mouth at bedtime.     . busPIRone (BUSPAR) 10 MG tablet Take 20 mg by mouth 2 (two) times daily.     . citalopram (CELEXA) 20 MG tablet Take 20 mg by mouth daily.    Marland Kitchen ezetimibe (ZETIA) 10 MG tablet Take 10 mg by mouth daily.     . fenofibrate micronized (LOFIBRA) 134 MG capsule Take 134 mg by mouth daily.    Marland Kitchen gabapentin (NEURONTIN) 300 MG capsule Take 300 mg by mouth 2 (two) times daily.    Marland Kitchen gabapentin (NEURONTIN) 600 MG tablet Take 600 mg by mouth daily.    Marland Kitchen GLYXAMBI 25-5 MG TABS Take 1 tablet by mouth daily.    . insulin aspart (NOVOLOG FLEXPEN) 100 UNIT/ML FlexPen Inject 3 Units subcutaneously. Sliding scale.    . insulin regular human CONCENTRATED (HUMULIN R) 500 UNIT/ML injection Inject 80  Units into the skin 3 (three) times daily with meals.    Marland Kitchen ipratropium (ATROVENT) 0.03 % nasal spray Place 2 sprays into both nostrils every 12 (twelve) hours. 30 mL 1  . lamoTRIgine (LAMICTAL) 200 MG tablet Take 200 mg by mouth 2 (two) times daily.     Marland Kitchen lisinopril (ZESTRIL) 2.5 MG tablet TAKE 1 TABLET BY MOUTH EVERY DAY 90 tablet 0  . metoprolol tartrate (LOPRESSOR) 25 MG tablet Take 0.5 tablets (12.5 mg total) by mouth 2 (two) times daily. 30 tablet 5  . QUEtiapine (SEROQUEL) 200 MG tablet Take 200 mg by mouth at bedtime. Along with 350m  dose for a total of 516m    . SYNTHROID 100 MCG tablet Take 100 mcg by mouth daily.    . TRULICITY 1.5 MOM/3.5DHSOPN SMARTSIG:0.5 Milliliter(s) SUB-Q Once a Week    . pantoprazole (PROTONIX) 40 MG tablet TAKE 1 TABLET BY MOUTH DAILY 30 tablet 0   No current facility-administered medications on file prior to visit.     Family Hx: [name] family history includes Asthma in her daughter; Bipolar disorder in her daughter and daughter; Breast cancer (age of onset: 6101 in her maternal grandmother; Cancer in her daughter, maternal grandfather, and paternal grandmother; Congestive Heart Failure in her mother; Diabetes in her mother; Glaucoma in her mother; Heart disease in her maternal grandfather and mother; Hyperlipidemia in her mother; Hypertension in her daughter, father, and mother; Hypothyroidism in her mother; Kidney disease in her brother, maternal grandfather, and mother; Mental illness in her mother; Osteoporosis in her mother; Restless legs syndrome in her daughter; Rheum arthritis in her maternal grandfather; Stroke in her mother.  ROS:   Please see the history of present illness.    Review of Systems  Constitutional: Negative.   HENT: Negative.   Respiratory: Negative.   Cardiovascular: Positive for chest pain.  Gastrointestinal: Negative.   Musculoskeletal: Negative.        Leg weakness  Neurological: Negative.   Psychiatric/Behavioral:  Negative.   All other systems reviewed and are negative.    Labs/Other Tests and Data Reviewed:    Recent Labs: 05/18/2020: ALT 67 09/16/2020: B Natriuretic Peptide 55.1; BUN 20; Creatinine, Ser 1.03; Hemoglobin 11.8; Magnesium 1.6; Platelets 230; Potassium 4.1; Sodium 144   Recent Lipid Panel Lab Results  Component Value Date/Time   CHOL 196 09/28/2016 02:56 PM   CHOL 196 09/22/2015 01:39 PM   CHOL 141 10/25/2013 03:14 AM   TRIG 856 (H) 09/28/2016 02:56 PM   TRIG 830 (H) 10/25/2013 03:14 AM   HDL 33 (L) 09/28/2016 02:56 PM   HDL 35 (L) 09/22/2015 01:39 PM   HDL 9 (L) 10/25/2013 03:14 AM   CHOLHDL 5.9 (H) 09/28/2016 02:56 PM   LDLCALC NOT CALC 09/28/2016 02:56 PM   LDLCALC 99 09/22/2015 01:39 PM   LDLCALC SEE COMMENT 10/25/2013 03:14 AM   LDLDIRECT 65.4 06/08/2008 02:56 PM    Wt Readings from Last 3 Encounters:  10/13/20 199 lb (90.3 kg)  09/16/20 200 lb (90.7 kg)  08/03/20 201 lb 11.2 oz (91.5 kg)     Exam:    Vital Signs: Vital signs may also be detailed in the HPI BP 118/80 (BP Location: Left Arm, Patient Position: Sitting, Cuff Size: Normal)   Pulse 85   Ht 4' 11"  (1.499 m)   Wt 199 lb (90.3 kg)   SpO2 93%   BMI 40.19 kg/m     Well nourished, well developed female in no acute distress. Constitutional:  oriented to person, place, and time. No distress.  Head: Normocephalic and atraumatic.  Eyes:  no discharge. No scleral icterus.  Neck: Normal range of motion. Neck supple.  Pulmonary/Chest: No audible wheezing, no distress, appears comfortable Musculoskeletal: Normal range of motion.  no  tenderness or deformity.  Neurological:   Coordination normal. Full exam not performed Skin:  No rash Psychiatric:  normal mood and affect. behavior is normal. Thought content normal.    ASSESSMENT & PLAN:    Problem List Items Addressed This Visit      Cardiology Problems   HYPERTENSION, BENIGN ESSENTIAL     Other  Chronic kidney disease, stage III (moderate)  (HCC)   Fatigue    Other Visit Diagnoses    Chronic heart failure with preserved ejection fraction (HCC)    -  Primary   Chest pain of uncertain etiology       History of COVID-19       Palpitations         Chest pain Prior history of chest pain Risk factors including diabetes, hyperlipidemia Coronary disease noted on chest CT scan Unable to treadmill, we have ordered pharmacologic Myoview Given obesity may need to be a 2-day study  Diabetes type 2 poorly controlled We have encouraged continued exercise, careful diet management in an effort to lose weight. Followed by Dr. Cranford Mon history of poorly controlled diabetes  Hyperlipidemia Continue Lipitor with Zetia Compliance recommended Numbers elevated from diabetes  Essential hypertension Blood pressure is well controlled on today's visit. No changes made to the medications.  Acute on chronic renal failure Stressed importance of aggressive diabetes control Appears euvolemic   Total encounter time more than 25 minutes  Greater than 50% was spent in counseling and coordination of care with the patient   Signed, Ida Rogue, [place] [facility] [id number], [place], [place] [address]

## 2020-10-12 ENCOUNTER — Other Ambulatory Visit: Payer: Self-pay | Admitting: Gastroenterology

## 2020-10-12 ENCOUNTER — Telehealth: Payer: Self-pay | Admitting: Gastroenterology

## 2020-10-12 ENCOUNTER — Other Ambulatory Visit: Payer: Self-pay

## 2020-10-12 DIAGNOSIS — R110 Nausea: Secondary | ICD-10-CM

## 2020-10-12 MED ORDER — ONDANSETRON HCL 4 MG PO TABS: ORAL_TABLET | ORAL | 1 refills | Status: DC

## 2020-10-12 NOTE — Telephone Encounter (Signed)
Zofran sento to pt's pharmacy per request.

## 2020-10-12 NOTE — Telephone Encounter (Signed)
Patient called to ask if Dr Allen Norris can call in enough Zofran to hold her over until appointment on 10/21/20. Call into [place].

## 2020-10-13 ENCOUNTER — Ambulatory Visit (INDEPENDENT_AMBULATORY_CARE_PROVIDER_SITE_OTHER): Payer: Medicare Other | Admitting: Cardiovascular Disease

## 2020-10-13 ENCOUNTER — Encounter: Payer: Self-pay | Admitting: Cardiovascular Disease

## 2020-10-13 ENCOUNTER — Other Ambulatory Visit: Payer: Self-pay

## 2020-10-13 VITALS — BP 118/80 | HR 85 | Ht 59.0 in | Wt 199.0 lb

## 2020-10-13 DIAGNOSIS — N183 Chronic kidney disease, stage 3 unspecified: Secondary | ICD-10-CM | POA: Diagnosis not present

## 2020-10-13 DIAGNOSIS — Z8616 Personal history of COVID-19: Secondary | ICD-10-CM

## 2020-10-13 DIAGNOSIS — I5032 Chronic diastolic (congestive) heart failure: Secondary | ICD-10-CM | POA: Diagnosis not present

## 2020-10-13 DIAGNOSIS — R079 Chest pain, unspecified: Secondary | ICD-10-CM | POA: Diagnosis not present

## 2020-10-13 DIAGNOSIS — I208 Other forms of angina pectoris: Secondary | ICD-10-CM

## 2020-10-13 DIAGNOSIS — R5383 Other fatigue: Secondary | ICD-10-CM

## 2020-10-13 DIAGNOSIS — I10 Essential (primary) hypertension: Secondary | ICD-10-CM | POA: Diagnosis not present

## 2020-10-13 DIAGNOSIS — R002 Palpitations: Secondary | ICD-10-CM

## 2020-10-13 MED ORDER — NITROGLYCERIN 0.4 MG SL SUBL: 0.4000 mg | SUBLINGUAL_TABLET | SUBLINGUAL | 0 refills | Status: DC | PRN

## 2020-10-13 NOTE — Patient Instructions (Addendum)
Medication Instructions:  NTG sent in  Lab work: No new labs needed  See if Dr. Jerilynn Mages will let you do labs on Thursday the 24th  Testing/Procedures:  lexiscan myoview on Friday Feb 25th [place]  Your caregiver has ordered a Stress Test with nuclear imaging. The purpose of this test is to evaluate the blood supply to your heart muscle. This procedure is referred to as a "Non-Invasive Stress Test." This is because other than having an IV started in your vein, nothing is inserted or "invades" your body. Cardiac stress tests are done to find areas of poor blood flow to the heart by determining the extent of coronary artery disease (CAD). Some patients exercise on a treadmill, which naturally increases the blood flow to your heart, while others who are  unable to walk on a treadmill due to physical limitations have a pharmacologic/chemical stress agent called Lexiscan . This medicine will mimic walking on a treadmill by temporarily increasing your coronary blood flow.   Please note: these test may take anywhere between 2-4 hours to complete  PLEASE REPORT TO [place] AT THE FIRST DESK WILL DIRECT YOU WHERE TO GO  Date of Procedure:_____________________________________  Arrival Time for Procedure:______________________________  Instructions regarding medication:   _X___ : Hold diabetes medication morning of procedure  _X__:  Hold betablocker(s) night before procedure and morning of procedure  Metoprolol   ____:  Hold other medications as follows:  If you take insulin at night, only take half dose   PLEASE NOTIFY THE OFFICE AT LEAST 24 HOURS IN ADVANCE IF YOU ARE UNABLE TO KEEP YOUR APPOINTMENT.  [phone] AND  PLEASE NOTIFY NUCLEAR MEDICINE AT [facility] AT LEAST 24 HOURS IN ADVANCE IF YOU ARE UNABLE TO KEEP YOUR APPOINTMENT. [phone]  How to prepare for your Myoview test:  1. Do not eat or drink after midnight 2. No caffeine for 24 hours prior  to test 3. No smoking 24 hours prior to test. 4. Your medication may be taken with water.  If your doctor stopped a medication because of this test, do not take that medication. 5. Ladies, please do not wear dresses.  Skirts or pants are appropriate. Please wear a short sleeve shirt. 6. No perfume, cologne or lotion. 7. Wear comfortable walking shoes. No heels!    Follow-Up:  . You will need a follow up appointment in 12 months  . Providers on your designated Care Team:   . Murray Hodgkins, NP . Christell Faith, PA-C . Marrianne Mood, PA-C  Any Other Special Instructions Will Be Listed Below (If Applicable).

## 2020-10-14 ENCOUNTER — Ambulatory Visit: Payer: Medicare Other

## 2020-10-21 ENCOUNTER — Encounter: Payer: Self-pay | Admitting: Gastroenterology

## 2020-10-21 ENCOUNTER — Other Ambulatory Visit: Payer: Self-pay

## 2020-10-21 ENCOUNTER — Ambulatory Visit (INDEPENDENT_AMBULATORY_CARE_PROVIDER_SITE_OTHER): Payer: Medicare Other | Admitting: Gastroenterology

## 2020-10-21 VITALS — BP 110/68 | HR 99 | Ht 59.0 in | Wt 200.6 lb

## 2020-10-21 DIAGNOSIS — F458 Other somatoform disorders: Secondary | ICD-10-CM

## 2020-10-21 MED ORDER — PROMETHAZINE HCL 12.5 MG PO TABS: 12.5000 mg | ORAL_TABLET | Freq: Four times a day (QID) | ORAL | 1 refills | Status: DC | PRN

## 2020-10-21 MED ORDER — DEXLANSOPRAZOLE 60 MG PO CPDR: 60.0000 mg | DELAYED_RELEASE_CAPSULE | Freq: Every day | ORAL | 6 refills | Status: DC

## 2020-10-21 NOTE — Progress Notes (Signed)
Primary Care Physician: Steele Sizer, MD  Primary Gastroenterologist:  Dr. [name]  No chief complaint on file.   HPI: [name] is a 55 y.o. female here for follow-up.  The patient has had a history of an EGD and colonoscopy in 2020 by me.  At that time the patient had a duodenal ulcer seen and reactive gastritis on biopsies with negative H. pylori biopsies.  The patient also had 2 polyps in the colon that were adenomatous and removed at that time.  Prior to having the procedure she saw me in the office with symptoms of burping and dyspepsia.  She also has a history of fatty liver disease with abnormal liver enzymes. The patient also reports that she was given Zofran for nausea but it was denied by her insurance.  She was told that the only covered benefit. She reports that she is having multiple other medical problems and is supposed to follow with a cardiologist, nephrologist and reports that she is undergoing work-up for these issues.  The patient reports that she has abdominal bloating.  Past Medical History:  Diagnosis Date  . Anxiety   . Arthritis   . Asthma   . Bipolar depression ([place])   . CHF (congestive heart failure) ([place])   . Chronic headaches    migraines have lessened  . Chronic kidney disease    ddstage 111  . COPD (chronic obstructive pulmonary disease) ([place])   . Coronary artery disease   . Depression   . Diabetic neuropathy ([place])   . Dyspnea    uses inhalers for this  . Dysrhythmia    rapid  . Fatty liver   . GERD (gastroesophageal reflux disease)   . Headache   . Heart attack ([place]) 2009  . Hyperlipidemia   . Hypertension   . Hypomagnesemia   . Hypothyroid   . Irregular heart rhythm   . Liver lesion    Favored to be benign on CT; MRI of abd w/contract in Aug 2016. Liver Biopsy-Negative, March 2016  . Morbid obesity ([place])   . OSA (obstructive sleep apnea)   . Peripheral vascular disease ([place])   . Pinched nerve    Back  . Pneumonia   .  RLS (restless legs syndrome)   . Seasonal allergies   . Sleep apnea    uses oxygen at night 2L d/t cpap did not fit  . Type 2 diabetes, uncontrolled, with neuropathy ([place]) 10/15/2015   Overview:  Microalbumin 7.7 12/2010: Hgb A1c 10.2% 12/2010 and 9.7% 03/2011, eye exam 10/2009 [facility] DM clinic   . Urinary incontinence   . Vitamin D deficiency     Current Outpatient Medications  Medication Sig Dispense Refill  . atorvastatin (LIPITOR) 80 MG tablet Take 80 mg by mouth at bedtime.     . busPIRone (BUSPAR) 10 MG tablet Take 20 mg by mouth 2 (two) times daily.     . citalopram (CELEXA) 20 MG tablet Take 20 mg by mouth daily.    Marland Kitchen ezetimibe (ZETIA) 10 MG tablet Take 10 mg by mouth daily.     . fenofibrate micronized (LOFIBRA) 134 MG capsule Take 134 mg by mouth daily.    Marland Kitchen gabapentin (NEURONTIN) 300 MG capsule Take 300 mg by mouth 2 (two) times daily.    Marland Kitchen gabapentin (NEURONTIN) 600 MG tablet Take 600 mg by mouth daily.    Marland Kitchen GLYXAMBI 25-5 MG TABS Take 1 tablet by mouth daily.    . insulin aspart (NOVOLOG  FLEXPEN) 100 UNIT/ML FlexPen Inject 3 Units subcutaneously. Sliding scale.    . insulin regular human CONCENTRATED (HUMULIN R) 500 UNIT/ML injection Inject 80 Units into the skin 3 (three) times daily with meals.    Marland Kitchen ipratropium (ATROVENT) 0.03 % nasal spray Place 2 sprays into both nostrils every 12 (twelve) hours. 30 mL 1  . lamoTRIgine (LAMICTAL) 200 MG tablet Take 200 mg by mouth 2 (two) times daily.     Marland Kitchen lisinopril (ZESTRIL) 2.5 MG tablet TAKE 1 TABLET BY MOUTH EVERY DAY 90 tablet 0  . metoprolol tartrate (LOPRESSOR) 25 MG tablet Take 0.5 tablets (12.5 mg total) by mouth 2 (two) times daily. 30 tablet 5  . nitroGLYCERIN (NITROSTAT) 0.4 MG SL tablet Place 1 tablet (0.4 mg total) under the tongue every 5 (five) minutes as needed for chest pain. 25 tablet 0  . pantoprazole (PROTONIX) 40 MG tablet TAKE 1 TABLET BY MOUTH DAILY 30 tablet 0  . QUEtiapine (SEROQUEL) 200 MG tablet Take 200 mg by mouth  at bedtime. Along with 318m dose for a total of 5082m   . SYNTHROID 100 MCG tablet Take 100 mcg by mouth daily.    . TRULICITY 1.5 MGHW/8.6HUOPN SMARTSIG:0.5 Milliliter(s) SUB-Q Once a Week     No current facility-administered medications for this visit.    Allergies as of 10/21/2020 - Review Complete 10/13/2020  Allergen Reaction Noted  . Mirtazapine Anaphylaxis   . Morphine and related  10/20/2013  . Sulfamethoxazole-trimethoprim Rash 06/09/2008  . Amoxicillin er Nausea And Vomiting 12/24/2017  . Codeine Itching   . Hydrocodone-acetaminophen Rash   . Keflex [cephalexin] Rash 07/04/2018  . Prasterone Nausea And Vomiting 12/24/2017    ROS:  General: Negative for anorexia, weight loss, fever, chills, fatigue, weakness. ENT: Negative for hoarseness, difficulty swallowing , nasal congestion. CV: Negative for chest pain, angina, palpitations, dyspnea on exertion, peripheral edema.  Respiratory: Negative for dyspnea at rest, dyspnea on exertion, cough, sputum, wheezing.  GI: See history of present illness. GU:  Negative for dysuria, hematuria, urinary incontinence, urinary frequency, nocturnal urination.  Endo: Negative for unusual weight change.    Physical Examination:   There were no vitals taken for this visit.  General: Well-nourished, well-developed in no acute distress.  Eyes: No icterus. Conjunctivae pink. Lungs: Clear to auscultation bilaterally. Non-labored. Heart: Regular rate and rhythm, no murmurs rubs or gallops.  Abdomen: Bowel sounds are normal, nontender, nondistended, no hepatosplenomegaly or masses, no abdominal bruits or hernia , no rebound or guarding.   Extremities: No lower extremity edema. No clubbing or deformities. Neuro: Alert and oriented x 3.  Grossly intact. Skin: Warm and dry, no jaundice.   Psych: Alert and cooperative, normal mood and affect.  Labs:    Imaging Studies: No results found.  Assessment and Plan:   Stacey BIRENBAUMs a [age].o.  y/o female who comes in with with a history of dyspepsia now comes in with bloating and chronic nausea.  The patient would like to start on Phenergan and has been given a prescription of Phenergan.  The patient will also be switched to Dexilant has been told to try this to see if this for dyspepsia patient may need to be started on a tricyclic antidepressant at bedtime for functional bowel disorder if her symptoms do not improve.  The patient has been explained plan it.     DaLucilla LameMD. FAMarval Regal  Note: This dictation was prepared with Dragon dictation along with smaller phrase  technology. Any transcriptional errors that result from this process are unintentional.

## 2020-10-22 ENCOUNTER — Telehealth: Payer: Self-pay | Admitting: Family Medicine

## 2020-10-22 NOTE — Telephone Encounter (Signed)
Copied from [place] [id number]. Topic: Medicare AWV >> Oct 22, 2020  1:24 PM Cher Nakai R wrote: Reason for CRM:   No answer unable to leave a message for patient to call back and schedule Medicare Annual Wellness Visit (AWV) in office.   If unable to come into the office for AWV,  please offer to do virtually or by telephone.  Last AWV: 09/25/2019  Please schedule at anytime with [place].  40 minute appointment  Any questions, please contact me at [phone]

## 2020-10-26 DIAGNOSIS — E7800 Pure hypercholesterolemia, unspecified: Secondary | ICD-10-CM | POA: Diagnosis not present

## 2020-10-26 DIAGNOSIS — I10 Essential (primary) hypertension: Secondary | ICD-10-CM | POA: Diagnosis not present

## 2020-10-26 DIAGNOSIS — E1165 Type 2 diabetes mellitus with hyperglycemia: Secondary | ICD-10-CM | POA: Diagnosis not present

## 2020-10-26 DIAGNOSIS — D631 Anemia in chronic kidney disease: Secondary | ICD-10-CM | POA: Diagnosis not present

## 2020-10-26 LAB — LIPID PANEL
HDL: 27 — AB (ref 35–70)
LDL Cholesterol: 37
Triglycerides: 226 — AB (ref 40–160)

## 2020-10-26 LAB — CBC AND DIFFERENTIAL
HCT: 37 (ref 36–46)
Hemoglobin: 11.8 — AB (ref 12.0–16.0)
Platelets: 264 (ref 150–399)
WBC: 8.2

## 2020-10-26 LAB — HEPATIC FUNCTION PANEL
ALT: 18 (ref 7–35)
AST: 23 (ref 13–35)
Alkaline Phosphatase: 50 (ref 25–125)
Bilirubin, Total: 0.2

## 2020-10-26 LAB — COMPREHENSIVE METABOLIC PANEL
GFR calc Af Amer: 55
GFR calc non Af Amer: 48

## 2020-10-26 LAB — BASIC METABOLIC PANEL
BUN: 29 — AB (ref 4–21)
Chloride: 102 (ref 99–108)
Creatinine: 1.3 — AB (ref 0.5–1.1)
Glucose: 188
Potassium: 4.9 (ref 3.4–5.3)
Sodium: 139 (ref 137–147)

## 2020-10-26 LAB — IRON,TIBC AND FERRITIN PANEL: Ferritin: 89

## 2020-10-26 LAB — HEMOGLOBIN A1C: Hemoglobin A1C: 8.7

## 2020-10-26 LAB — TSH: TSH: 0.25 — AB (ref 0.41–5.90)

## 2020-10-26 LAB — CBC: RBC: 3.96 (ref 3.87–5.11)

## 2020-10-29 ENCOUNTER — Encounter
Admission: RE | Admit: 2020-10-29 | Discharge: 2020-10-29 | Disposition: A | Discharge status: Home / Self Care | Payer: Medicare Other | Source: Ambulatory Visit | Attending: Cardiovascular Disease | Admitting: Cardiovascular Disease

## 2020-10-29 ENCOUNTER — Other Ambulatory Visit: Payer: Self-pay

## 2020-10-29 DIAGNOSIS — I208 Other forms of angina pectoris: Secondary | ICD-10-CM | POA: Diagnosis not present

## 2020-10-29 DIAGNOSIS — I5032 Chronic diastolic (congestive) heart failure: Secondary | ICD-10-CM | POA: Diagnosis not present

## 2020-10-29 DIAGNOSIS — R079 Chest pain, unspecified: Secondary | ICD-10-CM

## 2020-10-29 LAB — NM MYOCAR MULTI W/SPECT W/WALL MOTION / EF
LV dias vol: 58 mL (ref 46–106)
LV sys vol: 22 mL
MPHR: 163 {beats}/min
Peak HR: 100 {beats}/min
Percent HR: 61 %
Rest HR: 87 {beats}/min
SDS: 1
SRS: 1
SSS: 5
TID: 0.89

## 2020-10-29 MED ORDER — TECHNETIUM TC 99M TETROFOSMIN IV KIT
32.7400 | PACK | Freq: Once | INTRAVENOUS | Status: AC | PRN
  Administered 2020-10-29: 32.74 via INTRAVENOUS

## 2020-10-29 MED ORDER — TECHNETIUM TC 99M TETROFOSMIN IV KIT
10.0500 | PACK | Freq: Once | INTRAVENOUS | Status: AC | PRN
  Administered 2020-10-29: 10.05 via INTRAVENOUS

## 2020-10-29 MED ORDER — REGADENOSON 0.4 MG/5ML IV SOLN
0.4000 mg | Freq: Once | INTRAVENOUS | Status: AC
  Administered 2020-10-29: 0.4 mg via INTRAVENOUS

## 2020-11-02 ENCOUNTER — Telehealth: Payer: Self-pay

## 2020-11-02 DIAGNOSIS — I10 Essential (primary) hypertension: Secondary | ICD-10-CM | POA: Diagnosis not present

## 2020-11-02 DIAGNOSIS — E1165 Type 2 diabetes mellitus with hyperglycemia: Secondary | ICD-10-CM | POA: Diagnosis not present

## 2020-11-02 DIAGNOSIS — E039 Hypothyroidism, unspecified: Secondary | ICD-10-CM | POA: Diagnosis not present

## 2020-11-02 DIAGNOSIS — D631 Anemia in chronic kidney disease: Secondary | ICD-10-CM | POA: Diagnosis not present

## 2020-11-02 NOTE — Telephone Encounter (Signed)
Able to reach pt regarding her recent Lexiscan, Dr. Rockey Situ had a chance to review her results and advised  "Pharmacological myocardial perfusion imaging study with no significant ischemia  Normal wall motion, EF estimated at 65%  No EKG changes concerning for ischemia at peak stress or in recovery.  CT attenuation correction images with mild aortic atherosclerosis, moderate proximal to mid LAD disease.  Low risk scan" [name] is delighted of her results, explained to pt in detail interpretation of results, pt verbalized understanding, otherwise all questions or concerns were address and no additional concerns at this time, will call back for anything further.

## 2020-11-03 DIAGNOSIS — F250 Schizoaffective disorder, bipolar type: Secondary | ICD-10-CM | POA: Diagnosis not present

## 2020-11-04 ENCOUNTER — Encounter: Payer: Self-pay | Admitting: Family Medicine

## 2020-11-04 ENCOUNTER — Other Ambulatory Visit: Payer: Self-pay

## 2020-11-04 ENCOUNTER — Ambulatory Visit (INDEPENDENT_AMBULATORY_CARE_PROVIDER_SITE_OTHER): Payer: Medicare Other | Admitting: Family Medicine

## 2020-11-04 VITALS — BP 130/76 | HR 76 | Temp 99.1°F | Resp 18 | Ht 59.0 in | Wt 200.0 lb

## 2020-11-04 DIAGNOSIS — I700 Atherosclerosis of aorta: Secondary | ICD-10-CM

## 2020-11-04 DIAGNOSIS — J449 Chronic obstructive pulmonary disease, unspecified: Secondary | ICD-10-CM

## 2020-11-04 DIAGNOSIS — E1142 Type 2 diabetes mellitus with diabetic polyneuropathy: Secondary | ICD-10-CM | POA: Diagnosis not present

## 2020-11-04 DIAGNOSIS — G2581 Restless legs syndrome: Secondary | ICD-10-CM

## 2020-11-04 DIAGNOSIS — E038 Other specified hypothyroidism: Secondary | ICD-10-CM

## 2020-11-04 DIAGNOSIS — G4733 Obstructive sleep apnea (adult) (pediatric): Secondary | ICD-10-CM

## 2020-11-04 DIAGNOSIS — I509 Heart failure, unspecified: Secondary | ICD-10-CM

## 2020-11-04 DIAGNOSIS — Z1231 Encounter for screening mammogram for malignant neoplasm of breast: Secondary | ICD-10-CM

## 2020-11-04 DIAGNOSIS — E669 Obesity, unspecified: Secondary | ICD-10-CM

## 2020-11-04 DIAGNOSIS — E1169 Type 2 diabetes mellitus with other specified complication: Secondary | ICD-10-CM

## 2020-11-04 DIAGNOSIS — F250 Schizoaffective disorder, bipolar type: Secondary | ICD-10-CM

## 2020-11-04 MED ORDER — LISINOPRIL 2.5 MG PO TABS: 2.5000 mg | ORAL_TABLET | Freq: Every day | ORAL | 2 refills | Status: DC

## 2020-11-04 NOTE — Progress Notes (Signed)
Name: [name]   MRN: [id number]    DOB: 07/20/1966   Date:11/04/2020       Progress Note  Subjective  Chief Complaint  Chief Complaint  Patient presents with  . Follow-up  . Hyperlipidemia  . Diabetes  . Hypertension  . Anxiety    HPI  RLS: she states she has been on Gabapentin for a long time, she states dose was increased in December 2020 She has an urge to move it at night, both feet feels on fire , at times during the day also , it can happen during activity or rest, but worse at night when resting . It causes difficulty for her to fall asleep. She also has cramps at night. Explained that iron deficiency anemia can cause worsening of symptoms. Reviewed labs from Dr. Ronnald Collum and ferritin was normal, recent visit to [facility] low magnesium, she does not want to have labs done today.   Hypersomnolence: she states positive for OSA however unable to find a mask that fits her face and is only on nocturnal oxygen,explained it may be a component of her fatigue   DMII: seeing Dr. Ronnald Collum, A1C down from 10.6 % in January to  8.7 % a month later.  She has episodes of  polyphagia, polydipsia or polyuria. She is complaint with her medications.   She states since last visit she has changed her diet, she has switched to water and eating smaller amounts. She has obesity and neuropathy. She was reminded to have yearly eye exam. She is on insulin and has noticed that since change in diet her glucose has been dropping often, Dr. Ronnald Collum advised to decrease insulin to 70 unit TID and seems like she is still taking 80 units. .   Hypothyroidism: managed by Dr. Ronnald Collum, she has gained weight and feels tired, reviewed last TSH and it was 0.251   Schizoaffective disorder/Bipolar : she is no longer seeing Dr. Nicolasa Ducking under the care of [facility] , she is compliant with medications and visits. Reviewed medication with patients today, she states she is feeling well but trying to switch to [facility] since it  will be closer to her house   CHF with preserved EF: recently seen by Dr. Rockey Situ, has to use oxygen at night 2 liters. She has SOB with mild activity. She is on statin therapy, ACE , beta blocker and denies side effects of medication   Vitamin B12 and Vtiamin D deficiency: taking supplementation  Morbid obesity: BMI of 40,she is eating healthier, discussed physical activity   CKI: she sees Dr. Holley Raring her last GFR last month was 21, she is on ARB and glucose is getting better controlled, good urine output and no pruritis   Atherosclerosis of aorta: continue medications, on statin therapy and zetia, LDL is at goal   Patient Active Problem List   Diagnosis Date Noted  . Chronic congestive heart failure ([place]) 11/04/2020  . Iron deficiency anemia due to chronic blood loss   . Polyp of ascending colon   . Chronic gastric ulcer with hemorrhage   . Vitamin B12 deficiency 08/08/2018  . Patient is full code 10/30/2017  . Coronary artery disease 10/09/2017  . Plantar fascial fibromatosis 10/09/2017  . Tendinitis of wrist 10/09/2017  . Schizoaffective disorder, bipolar type ([place]) 09/28/2017  . 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 Breast cancer screening 09/28/2016  . Adenomatous colon polyp 10/15/2015  . Bipolar affective disorder ([place]) 10/15/2015  . CN (constipation) 10/15/2015  .  Drug-induced hepatic toxicity 10/15/2015  . HLD (hyperlipidemia) 10/15/2015  . Hypoxemia 10/15/2015  . NASH (nonalcoholic steatohepatitis) 10/15/2015  . Morbid obesity ([place]) 10/15/2015  . Restless leg syndrome 10/15/2015  . Type 2 diabetes, uncontrolled, with neuropathy ([place]) 10/15/2015  . Elevated alkaline phosphatase level 09/22/2015  . Low back pain with left-sided sciatica 08/18/2015  . Apnea, sleep 05/05/2015  . Heart valve disease 05/05/2015  . Chronic kidney disease, stage III (moderate) (HCC) 03/15/2015  . Migraine 03/12/2015  . COPD (chronic obstructive pulmonary disease) ([place])  01/20/2015  . 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 GERD (gastroesophageal reflux disease) 01/20/2015  . Hypothyroid   . Vitamin D deficiency   . Urinary incontinence   . Liver lesion   . Hypomagnesemia   . Obstructive sleep apnea 10/20/2013  . PERIPHERAL NEUROPATHY 03/04/2009  . INTERSTITIAL CYSTITIS 07/04/2007  . Allergic rhinitis 07/04/2007    Past Surgical History:  Procedure Laterality Date  . ABDOMINAL HYSTERECTOMY     Partial  . bladder stim  2007  . BLADDER SURGERY     x 2. tacking of bladder and the other was the stimulator placement  . CHOLECYSTECTOMY    . COLONOSCOPY WITH PROPOFOL N/A 03/04/2019   Procedure: COLONOSCOPY WITH PROPOFOL;  Surgeon: [name], MD;  Location: [facility] ENDOSCOPY;  Service: Endoscopy;  Laterality: N/A;  . ESOPHAGOGASTRODUODENOSCOPY (EGD) WITH PROPOFOL N/A 03/04/2019   Procedure: ESOPHAGOGASTRODUODENOSCOPY (EGD) WITH PROPOFOL;  Surgeon: [name], MD;  Location: [facility] ENDOSCOPY;  Service: Endoscopy;  Laterality: N/A;  . INCISION AND DRAINAGE ABSCESS Right 04/16/2019   Procedure: INCISION AND DRAINAGE ABSCESS;  Surgeon: Benjamine Sprague, DO;  Location: [facility] ORS;  Service: General;  Laterality: Right;  . INTERSTIM-GENERATOR CHANGE N/A 12/31/2017   Procedure: INTERSTIM-GENERATOR CHANGE;  Surgeon: Bjorn Loser, MD;  Location: [facility] ORS;  Service: Urology;  Laterality: N/A;  . LIVER BIOPSY  March 2016   Negative  . LUNG BIOPSY  2015   small spot on lung  . TUBAL LIGATION    . UPPER GI ENDOSCOPY    . WRIST SURGERY Right 11/02/2019    Family History  Problem Relation Age of Onset  . Diabetes Mother   . Heart disease Mother   . Hyperlipidemia Mother   . Hypertension Mother   . Kidney disease Mother   . Mental illness Mother   . Hypothyroidism Mother   . Stroke Mother   . Osteoporosis Mother   . Glaucoma Mother   . Congestive Heart Failure Mother   . Hypertension Father   . Asthma Daughter   . Cancer Daughter        throat  .  Bipolar disorder Daughter   . Heart disease Maternal Grandfather   . Rheum arthritis Maternal Grandfather   . Cancer Maternal Grandfather        liver  . Kidney disease Maternal Grandfather   . Cancer Paternal Grandmother        lung  . Kidney disease Brother   . Breast cancer Maternal Grandmother 60  . Bipolar disorder Daughter   . Hypertension Daughter   . Restless legs syndrome Daughter     Social History   Tobacco Use  . Smoking status: Never Smoker  . Smokeless tobacco: Never Used  Substance Use Topics  . Alcohol use: No     Current Outpatient Medications:  .  atorvastatin (LIPITOR) 80 MG tablet, Take 80 mg by mouth at bedtime. , Disp: , Rfl:  .  busPIRone (BUSPAR) 10 MG tablet, Take  20 mg by mouth 2 (two) times daily., Disp: , Rfl:  .  citalopram (CELEXA) 20 MG tablet, Take 20 mg by mouth daily., Disp: , Rfl:  .  dexlansoprazole (DEXILANT) 60 MG capsule, Take 1 capsule (60 mg total) by mouth daily., Disp: 30 capsule, Rfl: 6 .  ezetimibe (ZETIA) 10 MG tablet, Take 10 mg by mouth daily. , Disp: , Rfl:  .  fenofibrate micronized (LOFIBRA) 134 MG capsule, Take 134 mg by mouth daily., Disp: , Rfl:  .  gabapentin (NEURONTIN) 300 MG capsule, Take 300 mg by mouth 2 (two) times daily., Disp: , Rfl:  .  gabapentin (NEURONTIN) 600 MG tablet, Take 600 mg by mouth daily., Disp: , Rfl:  .  GLYXAMBI 25-5 MG TABS, Take 1 tablet by mouth daily., Disp: , Rfl:  .  insulin aspart (NOVOLOG FLEXPEN) 100 UNIT/ML FlexPen, Inject 3 Units subcutaneously. Sliding scale., Disp: , Rfl:  .  insulin regular human CONCENTRATED (HUMULIN R) 500 UNIT/ML injection, Inject 70 Units into the skin 3 (three) times daily with meals., Disp: , Rfl:  .  ipratropium (ATROVENT) 0.03 % nasal spray, Place 2 sprays into both nostrils every 12 (twelve) hours., Disp: 30 mL, Rfl: 1 .  lamoTRIgine (LAMICTAL) 200 MG tablet, Take 200 mg by mouth 2 (two) times daily. , Disp: , Rfl:  .  lisinopril (ZESTRIL) 2.5 MG tablet, TAKE  1 TABLET BY MOUTH EVERY DAY, Disp: 90 tablet, Rfl: 0 .  nitroGLYCERIN (NITROSTAT) 0.4 MG SL tablet, Place 1 tablet (0.4 mg total) under the tongue every 5 (five) minutes as needed for chest pain., Disp: 25 tablet, Rfl: 0 .  OneTouch Delica Lancets 32G MISC, by Does not apply route., Disp: , Rfl:  .  promethazine (PHENERGAN) 12.5 MG tablet, Take 1 tablet (12.5 mg total) by mouth every 6 (six) hours as needed for nausea or vomiting., Disp: 30 tablet, Rfl: 1 .  QUEtiapine (SEROQUEL) 200 MG tablet, Take 200 mg by mouth at bedtime. Along with 350m dose for a total of 5031m Disp: , Rfl:  .  SYNTHROID 100 MCG tablet, Take 100 mcg by mouth daily., Disp: , Rfl:  .  TRULICITY 1.5 MGMW/1.0UVOPN, SMARTSIG:0.5 Milliliter(s) SUB-Q Once a Week, Disp: , Rfl:  .  metoprolol tartrate (LOPRESSOR) 25 MG tablet, Take 0.5 tablets (12.5 mg total) by mouth 2 (two) times daily., Disp: 30 tablet, Rfl: 5  Allergies  Allergen Reactions  . Mirtazapine Anaphylaxis    REACTION: closes throat  . Morphine And Related     anaphylaxis  . Sulfamethoxazole-Trimethoprim Rash    REACTION: [place] all over  . Vicodin Hp [Hydrocodone-Acetaminophen]   . Amoxicillin Er Nausea And Vomiting    Pt doesn't remember problem with amoxicillin -  SE Has patient had a PCN reaction causing immediate rash, facial/tongue/throat swelling, SOB or lightheadedness with hypotension: No Has patient had a PCN reaction causing severe rash involving mucus membranes or skin necrosis: No Has patient had a PCN reaction that required hospitalization: No Has patient had a PCN reaction occurring within the last 10 years: Yes If all of the above answers are "NO", then may proceed with Cephalosporin use.  . Codeine Itching    REACTION: itch, rash  . Hydrocodone-Acetaminophen Rash    REACTION: rash  . Keflex [Cephalexin] Rash    Pt does not remember having a reaction or rash, no airway involvement  . Prasterone Nausea And Vomiting    I personally  reviewed active problem list, medication list, allergies, family  history, social history, health maintenance with the patient/caregiver today.   ROS  Constitutional: Negative for fever or weight change.  Respiratory: Negative for cough and shortness of breath.   Cardiovascular: Negative for chest pain or palpitations.  Gastrointestinal: Negative for abdominal pain, no bowel changes.  Musculoskeletal: Negative for gait problem or joint swelling.  Skin: Negative for rash.  Neurological: Negative for dizziness or headache.  No other specific complaints in a complete review of systems (except as listed in HPI above).  Objective  Vitals:   11/04/20 1350  BP: 130/76  Pulse: 76  Resp: 18  Temp: 99.1 F (37.3 C)  SpO2: 95%  Weight: 200 lb (90.7 kg)  Height: 4' 11"  (1.499 m)    Body mass index is 40.4 kg/m.  Physical Exam  Constitutional: Patient appears well-developed and well-nourished. Obese  No distress.  HEENT: head atraumatic, normocephalic, pupils equal and reactive to light,  neck supple Cardiovascular: Normal rate, regular rhythm and normal heart sounds.  No murmur heard. No BLE edema. Pulmonary/Chest: Effort normal and breath sounds normal. No respiratory distress. Abdominal: Soft.  There is no tenderness. Psychiatric: Patient has a normal mood and affect. behavior is normal. Judgment and thought content normal.   Recent Results (from the past 2160 hour(s))  Basic metabolic panel     Status: Abnormal   Collection Time: 09/16/20  1:35 PM  Result Value Ref Range   Sodium 144 135 - 145 mmol/L   Potassium 4.1 3.5 - 5.1 mmol/L   Chloride 106 98 - 111 mmol/L   CO2 26 22 - 32 mmol/L   Glucose, Bld 180 (H) 70 - 99 mg/dL    Comment: Glucose reference range applies only to samples taken after fasting for at least 8 hours.   BUN 20 6 - 20 mg/dL   Creatinine, Ser 1.03 (H) 0.44 - 1.00 mg/dL   Calcium 8.8 (L) 8.9 - 10.3 mg/dL   GFR, Estimated >60 >60 mL/min    Comment:  (NOTE) Calculated using the CKD-EPI Creatinine Equation (2021)    Anion gap 12 5 - 15    Comment: Performed at [facility], [address] [place], [place] [address]  CBC     Status: Abnormal   Collection Time: 09/16/20  1:35 PM  Result Value Ref Range   WBC 6.2 4.0 - 10.5 K/uL   RBC 3.98 3.87 - 5.11 MIL/uL   Hemoglobin 11.8 (L) 12.0 - 15.0 g/dL   HCT 37.1 36.0 - 46.0 %   MCV 93.2 80.0 - 100.0 fL   MCH 29.6 26.0 - 34.0 pg   MCHC 31.8 30.0 - 36.0 g/dL   RDW 14.4 11.5 - 15.5 %   Platelets 230 150 - 400 K/uL   nRBC 0.0 0.0 - 0.2 %    Comment: Performed at [facility], [place], [place] [address]  Troponin I (High Sensitivity)     Status: None   Collection Time: 09/16/20  1:35 PM  Result Value Ref Range   Troponin I (High Sensitivity) 6 <18 ng/L    Comment: (NOTE) Elevated high sensitivity troponin I (hsTnI) values and significant  changes across serial measurements may suggest ACS but many other  chronic and acute conditions are known to elevate hsTnI results.  Refer to the "Links" section for chest pain algorithms and additional  guidance. Performed at [facility] [address], [place],  [address]   Brain natriuretic peptide     Status: None  Collection Time: 09/16/20  1:35 PM  Result Value Ref Range   B Natriuretic Peptide 55.1 0.0 - 100.0 pg/mL    Comment: Performed at  [facility], [address] [place], [place] [address]  Procalcitonin - Baseline     Status: None   Collection Time: 09/16/20  1:35 PM  Result Value Ref Range   Procalcitonin <0.10 ng/mL    Comment:        Interpretation: PCT (Procalcitonin) <= 0.5 ng/mL: Systemic infection (sepsis) is not likely. Local bacterial infection is possible. (NOTE)       Sepsis PCT Algorithm           Lower Respiratory Tract                                      Infection PCT Algorithm    ----------------------------     ----------------------------          PCT < 0.25 ng/mL                PCT < 0.10 ng/mL          Strongly encourage             Strongly discourage   discontinuation of antibiotics    initiation of antibiotics    ----------------------------     -----------------------------       PCT 0.25 - 0.50 ng/mL            PCT 0.10 - 0.25 ng/mL               OR       >80% decrease in PCT            Discourage initiation of                                            antibiotics      Encourage discontinuation           of antibiotics    ----------------------------     -----------------------------         PCT >= 0.50 ng/mL              PCT 0.26 - 0.50 ng/mL               AND        <80% decrease in PCT             Encourage initiation of                                             antibiotics       Encourage continuation           of antibiotics    ----------------------------     -----------------------------        PCT >= 0.50 ng/mL                  PCT > 0.50 ng/mL               AND         increase in PCT  Strongly encourage                                      initiation of antibiotics    Strongly encourage escalation           of antibiotics                                     -----------------------------                                           PCT <= 0.25 ng/mL                                                 OR                                        > 80% decrease in PCT                                      Discontinue / Do not initiate                                             antibiotics  Performed at [facility], [address] [place], [place] [address]   Magnesium     Status: Abnormal   Collection Time: 09/16/20  4:30 PM  Result Value Ref Range   Magnesium 1.6 (L) 1.7 - 2.4 mg/dL    Comment: Performed at [facility], [place], [place] [address]  Troponin I (High Sensitivity)     Status: None   Collection Time: 09/16/20  5:58 PM  Result Value Ref Range    Troponin I (High Sensitivity) 6 <18 ng/L    Comment: (NOTE) Elevated high sensitivity troponin I (hsTnI) values and significant  changes across serial measurements may suggest ACS but many other  chronic and acute conditions are known to elevate hsTnI results.  Refer to the "Links" section for chest pain algorithms and additional  guidance. Performed at [facility], [place],  [address]   Resp Panel by RT-PCR (Flu A&B, Covid) Nasopharyngeal Swab     Status: None   Collection Time: 09/16/20  5:58 PM   Specimen: Nasopharyngeal Swab; Nasopharyngeal(NP) swabs in vial transport medium  Result Value Ref Range   SARS Coronavirus 2 by RT PCR NEGATIVE NEGATIVE    Comment: (NOTE) SARS-CoV-2 target nucleic acids are NOT DETECTED.  The SARS-CoV-2 RNA is generally detectable in upper respiratory specimens during the acute phase of infection. The lowest concentration of SARS-CoV-2 viral copies this assay can detect is 138 copies/mL. A negative result does not preclude SARS-Cov-2 infection and should not be used as the sole basis for treatment or other patient management decisions. A negative result  may occur with  improper specimen collection/handling, submission of specimen other than nasopharyngeal swab, presence of viral mutation(s) within the areas targeted by this assay, and inadequate number of viral copies(<138 copies/mL). A negative result must be combined with clinical observations, patient history, and epidemiological information. The expected result is Negative.  Fact Sheet for Patients:  EntrepreneurPulse.com.au  Fact Sheet for Healthcare Providers:  IncredibleEmployment.be  This test is no t yet approved or cleared by the [place] FDA and  has been authorized for detection and/or diagnosis of SARS-CoV-2 by FDA under an Emergency Use Authorization (EUA). This EUA will remain  in effect (meaning this test  can be used) for the duration of the COVID-19 declaration under Section 564(b)(1) of the Act, 21 U.S.C.section 360bbb-3(b)(1), unless the authorization is terminated  or revoked sooner.       Influenza A by PCR NEGATIVE NEGATIVE   Influenza B by PCR NEGATIVE NEGATIVE    Comment: (NOTE) The Xpert Xpress SARS-CoV-2/FLU/RSV plus assay is intended as an aid in the diagnosis of influenza from Nasopharyngeal swab specimens and should not be used as a sole basis for treatment. Nasal washings and aspirates are unacceptable for Xpert Xpress SARS-CoV-2/FLU/RSV testing.  Fact Sheet for Patients: EntrepreneurPulse.com.au  Fact Sheet for Healthcare Providers: IncredibleEmployment.be  This test is not yet approved or cleared by the [place] FDA and has been authorized for detection and/or diagnosis of SARS-CoV-2 by FDA under an Emergency Use Authorization (EUA). This EUA will remain in effect (meaning this test can be used) for the duration of the COVID-19 declaration under Section 564(b)(1) of the Act, 21 U.S.C. section 360bbb-3(b)(1), unless the authorization is terminated or revoked.  Performed at [facility], [address] [place], [place] [address]   Iron, TIBC and Ferritin Panel     Status: None   Collection Time: 10/26/20 12:00 AM  Result Value Ref Range   Ferritin 89   CBC and differential     Status: Abnormal   Collection Time: 10/26/20 12:00 AM  Result Value Ref Range   Hemoglobin 11.8 (A) 12.0 - 16.0   HCT 37 36 - 46   Platelets 264 150 - 399   WBC 8.2   CBC     Status: None   Collection Time: 10/26/20 12:00 AM  Result Value Ref Range   RBC 3.96 3.87 - 3.84  Basic metabolic panel     Status: Abnormal   Collection Time: 10/26/20 12:00 AM  Result Value Ref Range   Glucose 188    BUN 29 (A) 4 - 21   Creatinine 1.3 (A) 0.5 - 1.1   Potassium 4.9 3.4 - 5.3   Sodium 139 137 - 147   Chloride 102 99 - 108  Comprehensive  metabolic panel     Status: None   Collection Time: 10/26/20 12:00 AM  Result Value Ref Range   GFR calc Af Amer 55    GFR calc non Af Amer 48   Lipid panel     Status: Abnormal   Collection Time: 10/26/20 12:00 AM  Result Value Ref Range   Triglycerides 226 (A) 40 - 160   HDL 27 (A) 35 - 70   LDL Cholesterol 37   Hepatic function panel     Status: None   Collection Time: 10/26/20 12:00 AM  Result Value Ref Range   Alkaline Phosphatase 50 25 - 125   ALT 18 7 - 35   AST 23 13 - 35   Bilirubin,  Total 0.2   Hemoglobin A1c     Status: None   Collection Time: 10/26/20 12:00 AM  Result Value Ref Range   Hemoglobin A1C 8.7   TSH     Status: Abnormal   Collection Time: 10/26/20 12:00 AM  Result Value Ref Range   TSH 0.25 (A) 0.41 - 5.90  NM Myocar Multi W/Spect W/Wall Motion / EF     Status: None   Collection Time: 10/29/20 10:48 AM  Result Value Ref Range   Rest HR 87 bpm   Rest BP 119/74 mmHg   Percent HR 61 %   Peak HR 100 bpm   Peak BP 124/71 mmHg   MPHR 163 bpm   SSS 5    SRS 1    SDS 1    TID 0.89    LV sys vol 22 mL   LV dias vol 58 46 - 106 mL    Diabetic Foot Exam: Diabetic Foot Exam - Simple   Simple Foot Form Diabetic Foot exam was performed with the following findings: Yes 11/04/2020  3:04 PM  Visual Inspection See comments: Yes Sensation Testing Intact to touch and monofilament testing bilaterally: Yes Pulse Check Posterior Tibialis and Dorsalis pulse intact bilaterally: Yes Comments Thick skin with callus formation       PHQ2/9: Depression screen [facility] 2/9 11/04/2020 08/17/2020 08/03/2020 07/13/2020 06/15/2020  Decreased Interest 2 0 0 0 2  Down, Depressed, Hopeless 2 - 0 0 2  PHQ - 2 Score 4 0 0 0 4  Altered sleeping 0 0 0 0 2  Tired, decreased energy 2 0 0 0 2  Change in appetite 3 2 0 0 2  Feeling bad or failure about yourself  2 0 0 0 0  Trouble concentrating 1 0 0 0 3  Moving slowly or fidgety/restless 2 0 0 0 1  Suicidal thoughts 0 0 0 0 0   PHQ-9 Score 14 2 0 0 14  Difficult doing work/chores Not difficult at all Not difficult at all Not difficult at all Not difficult at all Not difficult at all  Some recent data might be hidden    phq 9 is positive   Fall Risk: Fall Risk  11/04/2020 08/17/2020 08/03/2020 07/13/2020 06/15/2020  Falls in the past year? 0 0 0 0 1  Comment - - - - -  Number falls in past yr: 0 0 0 0 1  Comment - - - - -  Injury with Fall? 0 0 0 0 0  Comment - - - - -  Risk for fall due to : - - - - Impaired balance/gait;Impaired mobility;History of fall(s)  Follow up - - Falls evaluation completed Falls evaluation completed -     Functional Status Survey: Is the patient deaf or have difficulty hearing?: No Does the patient have difficulty seeing, even when wearing glasses/contacts?: No Does the patient have difficulty concentrating, remembering, or making decisions?: No Does the patient have difficulty walking or climbing stairs?: No Does the patient have difficulty dressing or bathing?: No Does the patient have difficulty doing errands alone such as visiting a doctor's office or shopping?: No    Assessment & Plan  1. Atherosclerosis of aorta (HCC)  Continue medications, LDL is at goal   2. Diabetes mellitus type 2 in obese [facility])  - Ambulatory referral to Ophthalmology  3. Chronic obstructive pulmonary disease, unspecified COPD type ([place])   4. Diabetic polyneuropathy associated with type 2 diabetes mellitus (HCC)  A1C is  improving   5. OSA (obstructive sleep apnea)   6. Other specified hypothyroidism  Keep follow up with Dr. Ronnald Collum   7. Restless leg syndrome   8. Schizoaffective disorder, bipolar type ([place])  Under the care of psychiatrist.   9. Chronic congestive heart failure, unspecified heart failure type ([place])  Keep follow up with Dr. Rockey Situ  10. Encounter for screening mammogram for malignant neoplasm of breast  - MM 3D SCREEN BREAST BILATERAL; Future  11. Morbid  obesity ([place])  Discussed with the patient the risk posed by an increased BMI. Discussed importance of portion control, calorie counting and at least 150 minutes of physical activity weekly. Avoid sweet beverages and drink more water. Eat at least 6 servings of fruit and vegetables daily

## 2020-11-04 NOTE — Telephone Encounter (Signed)
Coming in at 1:40 per your request for 11-04-2020

## 2020-11-09 ENCOUNTER — Encounter: Payer: Self-pay | Admitting: Emergency Medicine

## 2020-11-09 ENCOUNTER — Emergency Department
Admission: EM | Admit: 2020-11-09 | Discharge: 2020-11-09 | Disposition: A | Discharge status: Home / Self Care | Payer: Medicare Other | Attending: Emergency Medicine | Admitting: Emergency Medicine

## 2020-11-09 ENCOUNTER — Other Ambulatory Visit: Payer: Self-pay

## 2020-11-09 DIAGNOSIS — Z794 Long term (current) use of insulin: Secondary | ICD-10-CM | POA: Diagnosis not present

## 2020-11-09 DIAGNOSIS — I2510 Atherosclerotic heart disease of native coronary artery without angina pectoris: Secondary | ICD-10-CM | POA: Insufficient documentation

## 2020-11-09 DIAGNOSIS — W268XXA Contact with other sharp object(s), not elsewhere classified, initial encounter: Secondary | ICD-10-CM | POA: Diagnosis not present

## 2020-11-09 DIAGNOSIS — E1122 Type 2 diabetes mellitus with diabetic chronic kidney disease: Secondary | ICD-10-CM | POA: Diagnosis not present

## 2020-11-09 DIAGNOSIS — S60931A Unspecified superficial injury of right thumb, initial encounter: Secondary | ICD-10-CM | POA: Diagnosis present

## 2020-11-09 DIAGNOSIS — Z8603 Personal history of neoplasm of uncertain behavior: Secondary | ICD-10-CM | POA: Diagnosis not present

## 2020-11-09 DIAGNOSIS — N183 Chronic kidney disease, stage 3 unspecified: Secondary | ICD-10-CM | POA: Diagnosis not present

## 2020-11-09 DIAGNOSIS — I5022 Chronic systolic (congestive) heart failure: Secondary | ICD-10-CM | POA: Diagnosis not present

## 2020-11-09 DIAGNOSIS — S61011A Laceration without foreign body of right thumb without damage to nail, initial encounter: Secondary | ICD-10-CM | POA: Diagnosis not present

## 2020-11-09 DIAGNOSIS — Z79899 Other long term (current) drug therapy: Secondary | ICD-10-CM | POA: Diagnosis not present

## 2020-11-09 DIAGNOSIS — E1140 Type 2 diabetes mellitus with diabetic neuropathy, unspecified: Secondary | ICD-10-CM | POA: Diagnosis not present

## 2020-11-09 DIAGNOSIS — J45909 Unspecified asthma, uncomplicated: Secondary | ICD-10-CM | POA: Insufficient documentation

## 2020-11-09 DIAGNOSIS — E039 Hypothyroidism, unspecified: Secondary | ICD-10-CM | POA: Diagnosis not present

## 2020-11-09 DIAGNOSIS — I130 Hypertensive heart and chronic kidney disease with heart failure and stage 1 through stage 4 chronic kidney disease, or unspecified chronic kidney disease: Secondary | ICD-10-CM | POA: Insufficient documentation

## 2020-11-09 DIAGNOSIS — J449 Chronic obstructive pulmonary disease, unspecified: Secondary | ICD-10-CM | POA: Insufficient documentation

## 2020-11-09 DIAGNOSIS — Z23 Encounter for immunization: Secondary | ICD-10-CM | POA: Insufficient documentation

## 2020-11-09 MED ORDER — TETANUS-DIPHTH-ACELL PERTUSSIS 5-2.5-18.5 LF-MCG/0.5 IM SUSY
0.5000 mL | PREFILLED_SYRINGE | Freq: Once | INTRAMUSCULAR | Status: AC
  Administered 2020-11-09: 0.5 mL via INTRAMUSCULAR
  Filled 2020-11-09: qty 0.5

## 2020-11-09 MED ORDER — TRAMADOL HCL 50 MG PO TABS
50.0000 mg | ORAL_TABLET | Freq: Once | ORAL | Status: AC
  Administered 2020-11-09: 50 mg via ORAL
  Filled 2020-11-09: qty 1

## 2020-11-09 NOTE — ED Triage Notes (Signed)
Pt presents to the ED with a R thumb laceration pt states she cut her thumb trying to open a can. Bleeding controlled. Pt is A&Ox4 and NAD

## 2020-11-09 NOTE — Discharge Instructions (Addendum)
Please keep Dermabond clean and dry.  You may remove dressing in 24 hours and apply Band-Aid as needed.  Return to the ER for any increase in swelling, warmth redness or drainage in 1 week you may get Dermabond wet, submerge underwater and allow Dermabond to come off on its own.

## 2020-11-09 NOTE — ED Provider Notes (Signed)
[facility] DEPARTMENT Provider Note   CSN: [id number] Arrival date & time: 11/09/20  1637     History Chief Complaint  Patient presents with  . Laceration    [name] is a 55 y.o. female presents to the emergency department evaluation of laceration to the right thumb.  Injury occurred just prior to arrival.  Tetanus is not up-to-date.  Bleeding controlled.  Pain moderate.  No other injury to her body.  Laceration located along the pulp space of the right thumb.  HPI     Past Medical History:  Diagnosis Date  . Anxiety   . Arthritis   . Asthma   . Bipolar depression ([place])   . CHF (congestive heart failure) ([place])   . Chronic headaches    migraines have lessened  . Chronic kidney disease    ddstage 111  . COPD (chronic obstructive pulmonary disease) ([place])   . Coronary artery disease   . Depression   . Diabetic neuropathy ([place])   . Dyspnea    uses inhalers for this  . Dysrhythmia    rapid  . Fatty liver   . GERD (gastroesophageal reflux disease)   . Headache   . Heart attack ([place]) 2009  . Hyperlipidemia   . Hypertension   . Hypomagnesemia   . Hypothyroid   . Irregular heart rhythm   . Liver lesion    Favored to be benign on CT; MRI of abd w/contract in Aug 2016. Liver Biopsy-Negative, March 2016  . Morbid obesity ([place])   . OSA (obstructive sleep apnea)   . Peripheral vascular disease ([place])   . Pinched nerve    Back  . Pneumonia   . RLS (restless legs syndrome)   . Seasonal allergies   . Sleep apnea    uses oxygen at night 2L d/t cpap did not fit  . Type 2 diabetes, uncontrolled, with neuropathy ([place]) 10/15/2015   Overview:  Microalbumin 7.7 12/2010: Hgb A1c 10.2% 12/2010 and 9.7% 03/2011, eye exam 10/2009 [facility] DM clinic   . Urinary incontinence   . Vitamin D deficiency     Patient Active Problem List   Diagnosis Date Noted  . Chronic congestive heart failure ([place]) 11/04/2020  . Iron deficiency anemia due to chronic blood  loss   . Polyp of ascending colon   . Chronic gastric ulcer with hemorrhage   . Vitamin B12 deficiency 08/08/2018  . Patient is full code 10/30/2017  . Coronary artery disease 10/09/2017  . Plantar fascial fibromatosis 10/09/2017  . Tendinitis of wrist 10/09/2017  . Schizoaffective disorder, bipolar type ([place]) 09/28/2017  . 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 Breast cancer screening 09/28/2016  . Adenomatous colon polyp 10/15/2015  . Bipolar affective disorder ([place]) 10/15/2015  . CN (constipation) 10/15/2015  . Drug-induced hepatic toxicity 10/15/2015  . HLD (hyperlipidemia) 10/15/2015  . Hypoxemia 10/15/2015  . NASH (nonalcoholic steatohepatitis) 10/15/2015  . Morbid obesity ([place]) 10/15/2015  . Restless leg syndrome 10/15/2015  . Type 2 diabetes, uncontrolled, with neuropathy ([place]) 10/15/2015  . Elevated alkaline phosphatase level 09/22/2015  . Low back pain with left-sided sciatica 08/18/2015  . Apnea, sleep 05/05/2015  . Heart valve disease 05/05/2015  . Chronic kidney disease, stage III (moderate) (HCC) 03/15/2015  . Migraine 03/12/2015  . COPD (chronic obstructive pulmonary disease) ([place]) 01/20/2015  . 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 GERD (gastroesophageal reflux disease) 01/20/2015  . Hypothyroid   . Vitamin D  deficiency   . Urinary incontinence   . Liver lesion   . Hypomagnesemia   . Obstructive sleep apnea 10/20/2013  . PERIPHERAL NEUROPATHY 03/04/2009  . INTERSTITIAL CYSTITIS 07/04/2007  . Allergic rhinitis 07/04/2007    Past Surgical History:  Procedure Laterality Date  . ABDOMINAL HYSTERECTOMY     Partial  . bladder stim  2007  . BLADDER SURGERY     x 2. tacking of bladder and the other was the stimulator placement  . CHOLECYSTECTOMY    . COLONOSCOPY WITH PROPOFOL N/A 03/04/2019   Procedure: COLONOSCOPY WITH PROPOFOL;  Surgeon: [name], MD;  Location: [facility] ENDOSCOPY;  Service: Endoscopy;  Laterality: N/A;  .  ESOPHAGOGASTRODUODENOSCOPY (EGD) WITH PROPOFOL N/A 03/04/2019   Procedure: ESOPHAGOGASTRODUODENOSCOPY (EGD) WITH PROPOFOL;  Surgeon: [name], MD;  Location: [facility] ENDOSCOPY;  Service: Endoscopy;  Laterality: N/A;  . INCISION AND DRAINAGE ABSCESS Right 04/16/2019   Procedure: INCISION AND DRAINAGE ABSCESS;  Surgeon: Benjamine Sprague, DO;  Location: [facility] ORS;  Service: General;  Laterality: Right;  . INTERSTIM-GENERATOR CHANGE N/A 12/31/2017   Procedure: INTERSTIM-GENERATOR CHANGE;  Surgeon: Bjorn Loser, MD;  Location: [facility] ORS;  Service: Urology;  Laterality: N/A;  . LIVER BIOPSY  March 2016   Negative  . LUNG BIOPSY  2015   small spot on lung  . TUBAL LIGATION    . UPPER GI ENDOSCOPY    . WRIST SURGERY Right 11/02/2019     OB History    Gravida  0   Para  0   Term  0   Preterm  0   AB  0   Living        SAB  0   IAB  0   Ectopic  0   Multiple      Live Births              Family History  Problem Relation Age of Onset  . Diabetes Mother   . Heart disease Mother   . Hyperlipidemia Mother   . Hypertension Mother   . Kidney disease Mother   . Mental illness Mother   . Hypothyroidism Mother   . Stroke Mother   . Osteoporosis Mother   . Glaucoma Mother   . Congestive Heart Failure Mother   . Hypertension Father   . Asthma Daughter   . Cancer Daughter        throat  . Bipolar disorder Daughter   . Heart disease Maternal Grandfather   . Rheum arthritis Maternal Grandfather   . Cancer Maternal Grandfather        liver  . Kidney disease Maternal Grandfather   . Cancer Paternal Grandmother        lung  . Kidney disease Brother   . Breast cancer Maternal Grandmother 60  . Bipolar disorder Daughter   . Hypertension Daughter   . Restless legs syndrome Daughter     Social History   Tobacco Use  . Smoking status: Never Smoker  . Smokeless tobacco: Never Used  Vaping Use  . Vaping Use: Never used  Substance Use Topics  . Alcohol use: No  . Drug  use: No    Home Medications Prior to Admission medications   Medication Sig Start Date End Date Taking? Authorizing Provider  atorvastatin (LIPITOR) 80 MG tablet Take 80 mg by mouth at bedtime.     [provider]  busPIRone (BUSPAR) 10 MG tablet Take 20 mg by mouth 2 (two) times daily.    [facility] [place]  [facility]  citalopram (CELEXA) 20 MG tablet Take 20 mg by mouth daily. 01/16/20   [facility], [place]  dexlansoprazole (DEXILANT) 60 MG capsule Take 1 capsule (60 mg total) by mouth daily. 10/21/20   [name], MD  ezetimibe (ZETIA) 10 MG tablet Take 10 mg by mouth daily.  01/05/17   [provider]  fenofibrate micronized (LOFIBRA) 134 MG capsule Take 134 mg by mouth daily.    [provider]  gabapentin (NEURONTIN) 300 MG capsule Take 300 mg by mouth 2 (two) times daily.    [provider]  gabapentin (NEURONTIN) 600 MG tablet Take 600 mg by mouth daily. 06/15/20   [provider]  GLYXAMBI 25-5 MG TABS Take 1 tablet by mouth daily. 10/01/20   [provider]  insulin aspart (NOVOLOG FLEXPEN) 100 UNIT/ML FlexPen Inject 3 Units subcutaneously. Sliding scale.    [provider]  insulin regular human CONCENTRATED (HUMULIN R) 500 UNIT/ML injection Inject 70 Units into the skin 3 (three) times daily with meals.    [provider]  ipratropium (ATROVENT) 0.03 % nasal spray Place 2 sprays into both nostrils every 12 (twelve) hours. 08/03/20   [name], PA-C  lamoTRIgine (LAMICTAL) 200 MG tablet Take 200 mg by mouth 2 (two) times daily.     [provider]  lisinopril (ZESTRIL) 2.5 MG tablet Take 1 tablet (2.5 mg total) by mouth daily. 11/04/20   Steele Sizer, MD  metoprolol tartrate (LOPRESSOR) 25 MG tablet Take 0.5 tablets (12.5 mg total) by mouth 2 (two) times daily. 06/08/20 09/06/20  Marrianne Mood D, PA-C  nitroGLYCERIN (NITROSTAT) 0.4 MG SL tablet Place 1 tablet (0.4 mg total) under the tongue every 5  (five) minutes as needed for chest pain. 10/13/20   Minna Merritts, MD  OneTouch Delica Lancets 12X MISC by Does not apply route.    [provider]  promethazine (PHENERGAN) 12.5 MG tablet Take 1 tablet (12.5 mg total) by mouth every 6 (six) hours as needed for nausea or vomiting. 10/21/20   [name], MD  QUEtiapine (SEROQUEL) 200 MG tablet Take 200 mg by mouth at bedtime. Along with 369m dose for a total of 5060m   [provider]  SYNTHROID 100 MCG tablet Take 100 mcg by mouth daily. 10/01/20   [provider]  TRULICITY 1.5 MGNT/7.0YFOPN SMARTSIG:0.5 Milliliter(s) SUB-Q Once a Week 10/01/20   [provider]    Allergies    Mirtazapine, Morphine and related, Sulfamethoxazole-trimethoprim, Vicodin hp [hydrocodone-acetaminophen], Amoxicillin er, Codeine, Hydrocodone-acetaminophen, Keflex [cephalexin], and Prasterone  Review of Systems   Review of Systems  Constitutional: Negative for fever.  Respiratory: Negative for shortness of breath.   Cardiovascular: Negative for chest pain.  Musculoskeletal: Negative for arthralgias.  Skin: Positive for wound.  Neurological: Negative for numbness.    Physical Exam Updated Vital Signs BP (!) 142/75 (BP Location: Left Arm)   Pulse 85   Temp 98.4 F (36.9 C) (Oral)   Resp 20   Ht 4' 11"  (1.499 m)   Wt 90.7 kg   SpO2 99%   BMI 40.40 kg/m   Physical Exam Constitutional:      Appearance: She is well-developed and well-nourished.  HENT:     Head: Normocephalic and atraumatic.  Eyes:     Conjunctiva/sclera: Conjunctivae normal.  Cardiovascular:     Rate and Rhythm: Normal rate.  Pulmonary:     Effort: Pulmonary effort is normal. No respiratory distress.  Musculoskeletal:  Cervical back: Normal range of motion.     Comments: Pulp space of the right thumb with vertical superficial laceration 1.5 cm.  No nail injury.  No active bleeding.  No visible or palpable foreign body.  No tendon deficits  noted.  Skin:    General: Skin is warm.     Findings: No rash.  Neurological:     Mental Status: She is alert and oriented to person, place, and time.  Psychiatric:        Mood and Affect: Mood and affect normal.        Behavior: Behavior normal.        Thought Content: Thought content normal.     ED Results / Procedures / Treatments   Labs (all labs ordered are listed, but only abnormal results are displayed) Labs Reviewed - No data to display  EKG None  Radiology No results found.  Procedures .[place] KitchenLaceration Repair  Date/Time: 11/09/2020 5:52 PM Performed by: Duanne Guess, PA-C Authorized by: Duanne Guess, PA-C   Consent:    Consent obtained:  Verbal   Consent given by:  Patient Laceration details:    Location:  Finger   Finger location:  R thumb   Length (cm):  1.5   Depth (mm):  2 Exploration:    Wound exploration: wound explored through full range of motion and entire depth of wound visualized   Treatment:    Area cleansed with:  Povidone-iodine and saline   Amount of cleaning:  Standard Skin repair:    Repair method:  Tissue adhesive Approximation:    Approximation:  Close Post-procedure details:    Dressing:  Bulky dressing   Procedure completion:  Tolerated     Medications Ordered in ED Medications  Tdap (BOOSTRIX) injection 0.5 mL (0.5 mLs Intramuscular Given 11/09/20 1743)    ED Course  I have reviewed the triage vital signs and the nursing notes.  Pertinent labs & imaging results that were available during my care of the patient were reviewed by me and considered in my medical decision making (see chart for details).    MDM Rules/Calculators/A&P                         55 year old female with clean linear laceration to the right thumb pulp space.  No tendon deficits noted.  Wound was cleansed and Dermabond applied.  She is educated on wound care.  Tetanus updated.     Final Clinical Impression(s) / ED Diagnoses Final diagnoses:   Laceration of right thumb without foreign body without damage to nail, initial encounter    Rx / DC Orders ED Discharge Orders    None       Duanne Guess, PA-C 11/09/20 1753    Naaman Plummer, MD 11/09/20 1806

## 2020-11-09 NOTE — ED Notes (Signed)
See triage note  Presents with laceration to right thumb  States she was opening a can

## 2020-11-11 ENCOUNTER — Telehealth: Payer: Self-pay | Admitting: Gastroenterology

## 2020-11-11 NOTE — Telephone Encounter (Signed)
Patient states Dexilant 38m is not working. She has been taking for a month now. What else can she take?

## 2020-11-15 NOTE — Telephone Encounter (Signed)
Patient called second time to ask about Dexilant 60 mg,  she states that it is not working.

## 2020-11-16 IMAGING — CR CHEST - 2 VIEW
2 series · 2 of 2 positions shown · non-contrast
Comparison: 01/03/2019

CLINICAL DATA: Axillary abscess

EXAM:
CHEST - 2 VIEW

[chest pa]
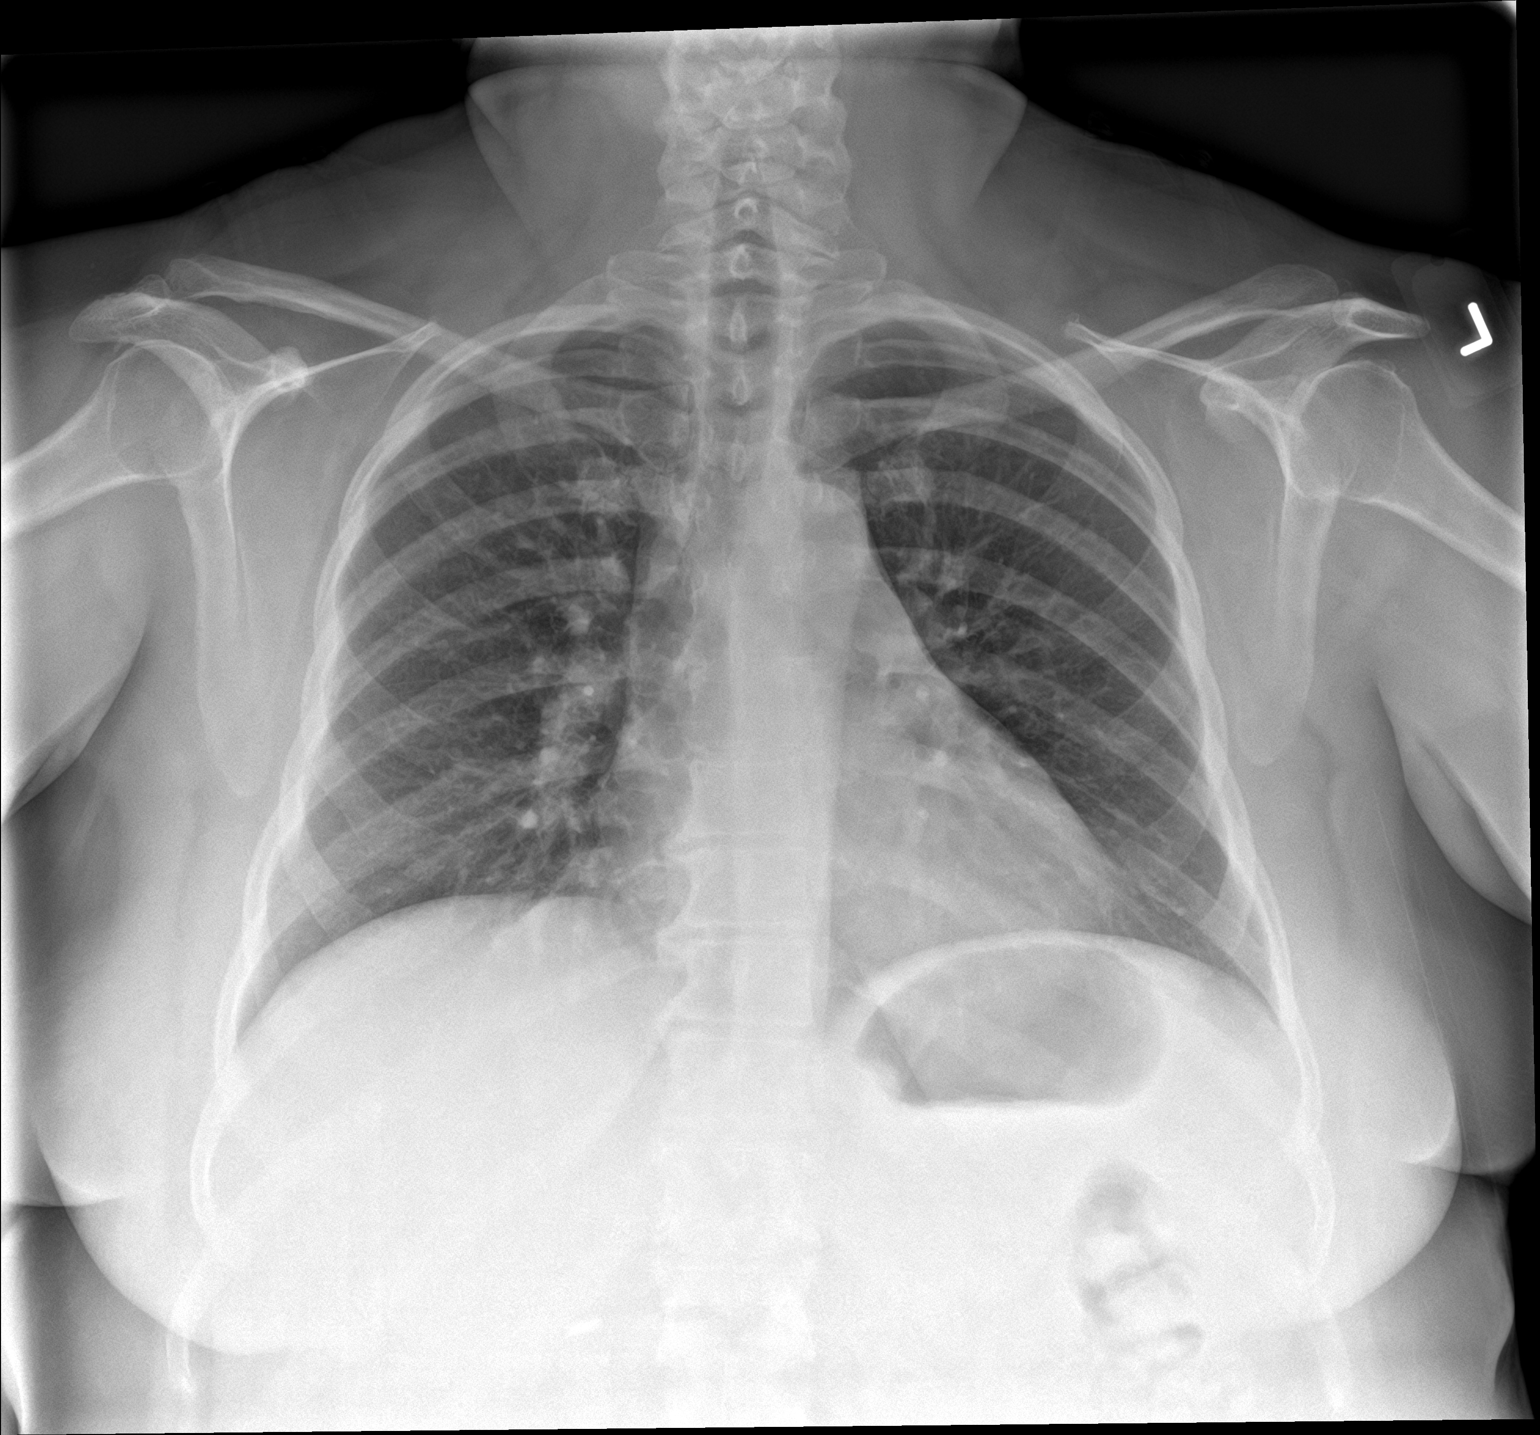

[chest lat]
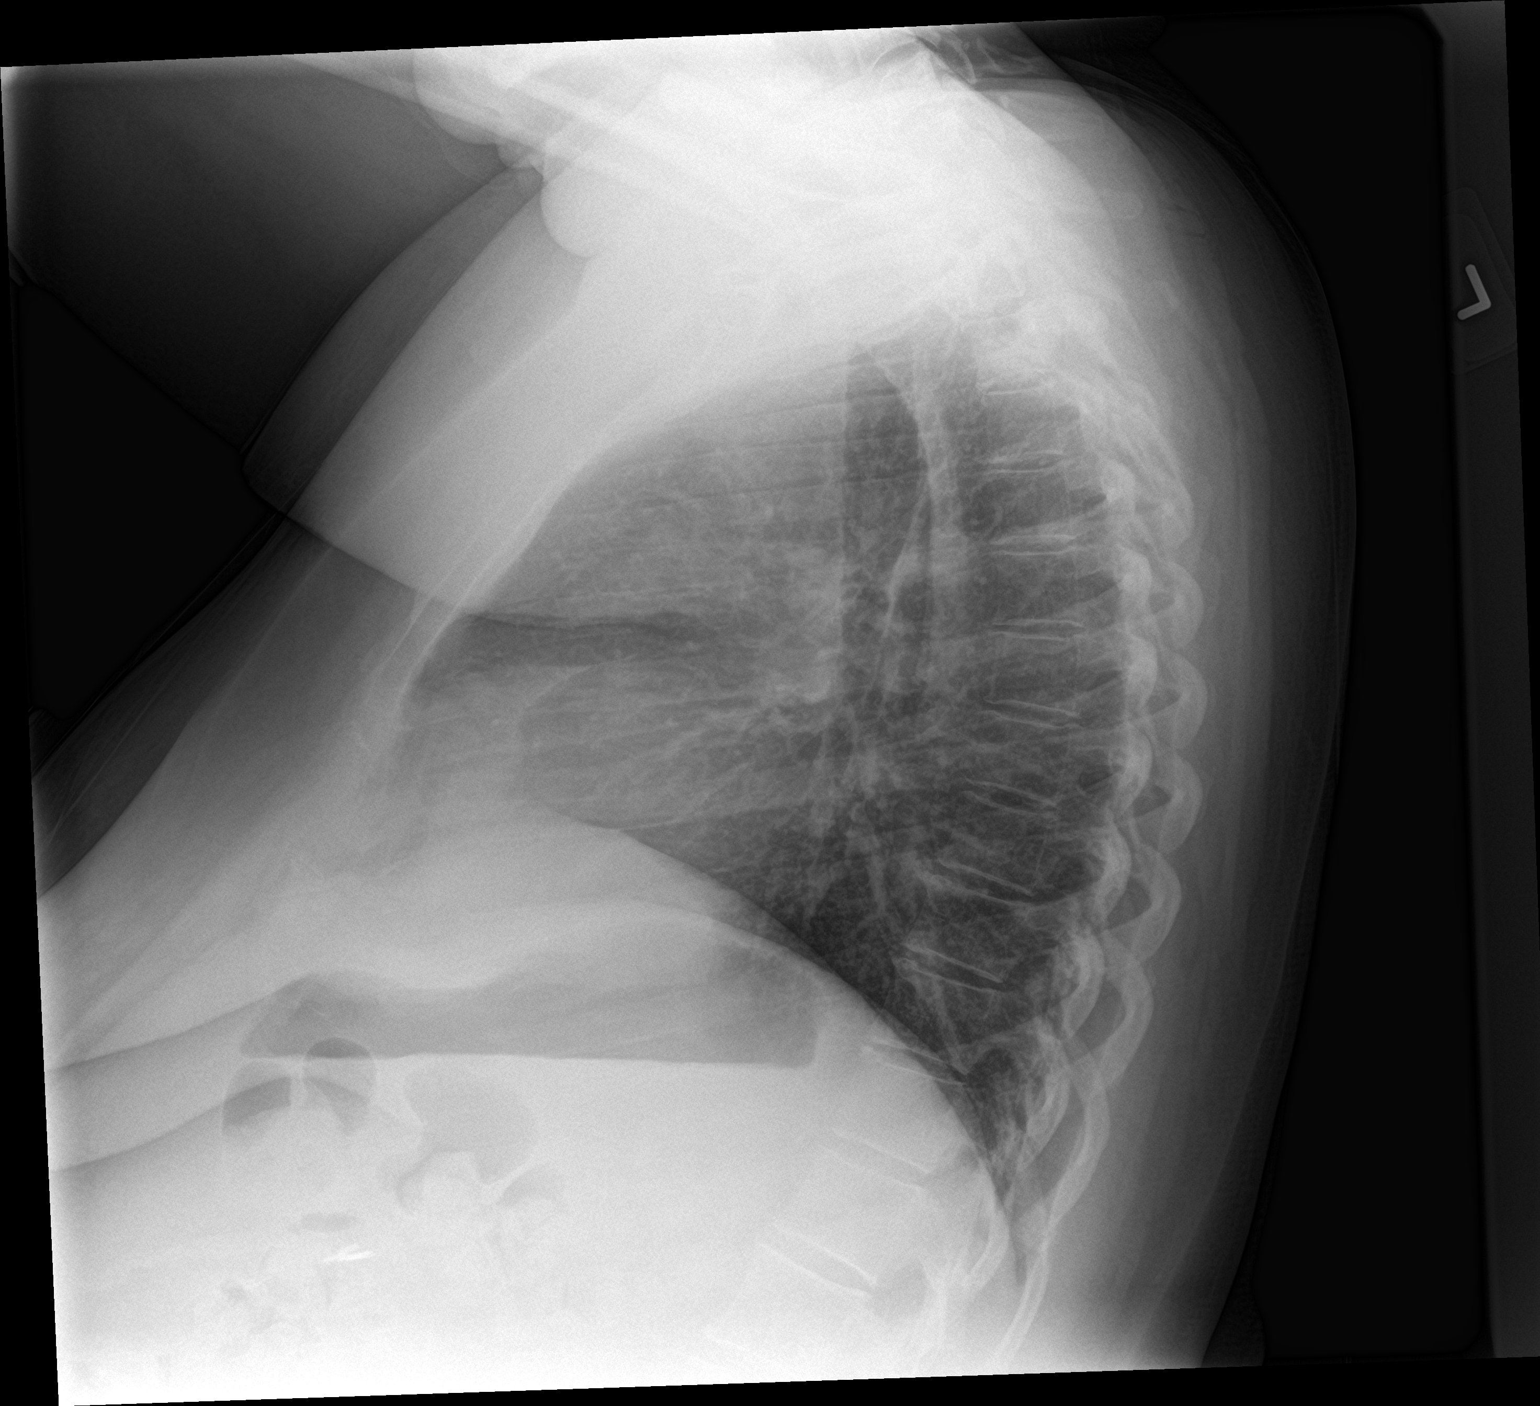

[2 of 2 positions shown; findings below may reference images not displayed]

FINDINGS: The heart size and mediastinal contours are within normal limits.
Both lungs are clear. The visualized skeletal structures are
unremarkable.
IMPRESSION: No active cardiopulmonary disease.

## 2020-11-16 NOTE — Telephone Encounter (Signed)
Pt is having both heartburn and nausea. Her symptoms are worse at night. Pt stated she is using a wedge pillow. Advised pt since her symptoms are worse during the night, she needs to start taking this in the evening prior to going to bed. Advised her to try this for a couple of weeks and if this doesn't improved, she will call me back.

## 2020-11-16 NOTE — Telephone Encounter (Signed)
What does she mean not working. Is she having heart burn or nausea or some other symptom?

## 2020-11-16 NOTE — Telephone Encounter (Signed)
Pt stated the Dexilant is not working for her. She has tried Pantoprazole 42m and Pepcid. Please advise.

## 2020-11-29 ENCOUNTER — Other Ambulatory Visit: Payer: Self-pay

## 2020-11-29 ENCOUNTER — Inpatient Hospital Stay
Admission: EM | Admit: 2020-11-29 | Discharge: 2020-12-03 | DRG: 871 | Disposition: A | Discharge status: Home / Self Care | Payer: Medicare Other | Attending: Internal Medicine | Admitting: Internal Medicine

## 2020-11-29 DIAGNOSIS — Z9981 Dependence on supplemental oxygen: Secondary | ICD-10-CM

## 2020-11-29 DIAGNOSIS — Z841 Family history of disorders of kidney and ureter: Secondary | ICD-10-CM

## 2020-11-29 DIAGNOSIS — I130 Hypertensive heart and chronic kidney disease with heart failure and stage 1 through stage 4 chronic kidney disease, or unspecified chronic kidney disease: Secondary | ICD-10-CM | POA: Diagnosis present

## 2020-11-29 DIAGNOSIS — Z6841 Body Mass Index (BMI) 40.0 and over, adult: Secondary | ICD-10-CM | POA: Diagnosis not present

## 2020-11-29 DIAGNOSIS — I10 Essential (primary) hypertension: Secondary | ICD-10-CM | POA: Diagnosis not present

## 2020-11-29 DIAGNOSIS — E1151 Type 2 diabetes mellitus with diabetic peripheral angiopathy without gangrene: Secondary | ICD-10-CM | POA: Diagnosis present

## 2020-11-29 DIAGNOSIS — F419 Anxiety disorder, unspecified: Secondary | ICD-10-CM | POA: Diagnosis not present

## 2020-11-29 DIAGNOSIS — Z803 Family history of malignant neoplasm of breast: Secondary | ICD-10-CM | POA: Diagnosis not present

## 2020-11-29 DIAGNOSIS — E785 Hyperlipidemia, unspecified: Secondary | ICD-10-CM | POA: Diagnosis present

## 2020-11-29 DIAGNOSIS — Z79890 Hormone replacement therapy (postmenopausal): Secondary | ICD-10-CM

## 2020-11-29 DIAGNOSIS — Z823 Family history of stroke: Secondary | ICD-10-CM

## 2020-11-29 DIAGNOSIS — G2581 Restless legs syndrome: Secondary | ICD-10-CM | POA: Diagnosis present

## 2020-11-29 DIAGNOSIS — E1142 Type 2 diabetes mellitus with diabetic polyneuropathy: Secondary | ICD-10-CM | POA: Diagnosis present

## 2020-11-29 DIAGNOSIS — Z20822 Contact with and (suspected) exposure to covid-19: Secondary | ICD-10-CM | POA: Diagnosis not present

## 2020-11-29 DIAGNOSIS — J449 Chronic obstructive pulmonary disease, unspecified: Secondary | ICD-10-CM | POA: Diagnosis present

## 2020-11-29 DIAGNOSIS — A419 Sepsis, unspecified organism: Secondary | ICD-10-CM | POA: Diagnosis present

## 2020-11-29 DIAGNOSIS — Z825 Family history of asthma and other chronic lower respiratory diseases: Secondary | ICD-10-CM

## 2020-11-29 DIAGNOSIS — E1122 Type 2 diabetes mellitus with diabetic chronic kidney disease: Secondary | ICD-10-CM | POA: Diagnosis present

## 2020-11-29 DIAGNOSIS — N3000 Acute cystitis without hematuria: Secondary | ICD-10-CM | POA: Diagnosis not present

## 2020-11-29 DIAGNOSIS — G43909 Migraine, unspecified, not intractable, without status migrainosus: Secondary | ICD-10-CM | POA: Diagnosis present

## 2020-11-29 DIAGNOSIS — Z83511 Family history of glaucoma: Secondary | ICD-10-CM

## 2020-11-29 DIAGNOSIS — IMO0002 Reserved for concepts with insufficient information to code with codable children: Secondary | ICD-10-CM

## 2020-11-29 DIAGNOSIS — R001 Bradycardia, unspecified: Secondary | ICD-10-CM | POA: Diagnosis not present

## 2020-11-29 DIAGNOSIS — Z79899 Other long term (current) drug therapy: Secondary | ICD-10-CM | POA: Diagnosis not present

## 2020-11-29 DIAGNOSIS — I2510 Atherosclerotic heart disease of native coronary artery without angina pectoris: Secondary | ICD-10-CM | POA: Diagnosis present

## 2020-11-29 DIAGNOSIS — Z880 Allergy status to penicillin: Secondary | ICD-10-CM

## 2020-11-29 DIAGNOSIS — F32A Depression, unspecified: Secondary | ICD-10-CM | POA: Diagnosis not present

## 2020-11-29 DIAGNOSIS — R6521 Severe sepsis with septic shock: Secondary | ICD-10-CM | POA: Diagnosis not present

## 2020-11-29 DIAGNOSIS — R579 Shock, unspecified: Secondary | ICD-10-CM | POA: Diagnosis present

## 2020-11-29 DIAGNOSIS — R0902 Hypoxemia: Secondary | ICD-10-CM | POA: Diagnosis not present

## 2020-11-29 DIAGNOSIS — F3130 Bipolar disorder, current episode depressed, mild or moderate severity, unspecified: Secondary | ICD-10-CM | POA: Diagnosis present

## 2020-11-29 DIAGNOSIS — Z8711 Personal history of peptic ulcer disease: Secondary | ICD-10-CM

## 2020-11-29 DIAGNOSIS — Z881 Allergy status to other antibiotic agents status: Secondary | ICD-10-CM

## 2020-11-29 DIAGNOSIS — N390 Urinary tract infection, site not specified: Secondary | ICD-10-CM

## 2020-11-29 DIAGNOSIS — I5032 Chronic diastolic (congestive) heart failure: Secondary | ICD-10-CM | POA: Diagnosis present

## 2020-11-29 DIAGNOSIS — R42 Dizziness and giddiness: Secondary | ICD-10-CM | POA: Diagnosis not present

## 2020-11-29 DIAGNOSIS — G4733 Obstructive sleep apnea (adult) (pediatric): Secondary | ICD-10-CM | POA: Diagnosis present

## 2020-11-29 DIAGNOSIS — I252 Old myocardial infarction: Secondary | ICD-10-CM

## 2020-11-29 DIAGNOSIS — Z794 Long term (current) use of insulin: Secondary | ICD-10-CM

## 2020-11-29 DIAGNOSIS — N1831 Chronic kidney disease, stage 3a: Secondary | ICD-10-CM | POA: Diagnosis present

## 2020-11-29 DIAGNOSIS — E039 Hypothyroidism, unspecified: Secondary | ICD-10-CM | POA: Diagnosis not present

## 2020-11-29 DIAGNOSIS — R55 Syncope and collapse: Secondary | ICD-10-CM | POA: Diagnosis present

## 2020-11-29 DIAGNOSIS — Z833 Family history of diabetes mellitus: Secondary | ICD-10-CM

## 2020-11-29 DIAGNOSIS — Z8249 Family history of ischemic heart disease and other diseases of the circulatory system: Secondary | ICD-10-CM

## 2020-11-29 DIAGNOSIS — E1140 Type 2 diabetes mellitus with diabetic neuropathy, unspecified: Secondary | ICD-10-CM

## 2020-11-29 LAB — COMPREHENSIVE METABOLIC PANEL
ALT: 26 U/L (ref 0–44)
AST: 29 U/L (ref 15–41)
Albumin: 3.8 g/dL (ref 3.5–5.0)
Alkaline Phosphatase: 43 U/L (ref 38–126)
Anion gap: 9 (ref 5–15)
BUN: 28 mg/dL — ABNORMAL HIGH (ref 6–20)
CO2: 26 mmol/L (ref 22–32)
Calcium: 9.8 mg/dL (ref 8.9–10.3)
Chloride: 104 mmol/L (ref 98–111)
Creatinine, Ser: 1.49 mg/dL — ABNORMAL HIGH (ref 0.44–1.00)
GFR, Estimated: 41 mL/min — ABNORMAL LOW (ref >60)
Glucose, Bld: 87 mg/dL (ref 70–99)
Potassium: 4.2 mmol/L (ref 3.5–5.1)
Sodium: 139 mmol/L (ref 135–145)
Total Bilirubin: 0.5 mg/dL (ref 0.3–1.2)
Total Protein: 6.4 g/dL — ABNORMAL LOW (ref 6.5–8.1)

## 2020-11-29 LAB — CBC WITH DIFFERENTIAL/PLATELET
Abs Immature Granulocytes: 0.13 10*3/uL — ABNORMAL HIGH (ref 0.00–0.07)
Basophils Absolute: 0.1 10*3/uL (ref 0.0–0.1)
Basophils Relative: 1 %
Eosinophils Absolute: 0.2 10*3/uL (ref 0.0–0.5)
Eosinophils Relative: 2 %
HCT: 41.7 % (ref 36.0–46.0)
Hemoglobin: 12.8 g/dL (ref 12.0–15.0)
Immature Granulocytes: 1 %
Lymphocytes Relative: 39 %
Lymphs Abs: 3.8 10*3/uL (ref 0.7–4.0)
MCH: 30.3 pg (ref 26.0–34.0)
MCHC: 30.7 g/dL (ref 30.0–36.0)
MCV: 98.8 fL (ref 80.0–100.0)
Monocytes Absolute: 0.7 10*3/uL (ref 0.1–1.0)
Monocytes Relative: 8 %
Neutro Abs: 4.8 10*3/uL (ref 1.7–7.7)
Neutrophils Relative %: 49 %
Platelets: 315 10*3/uL (ref 150–400)
RBC: 4.22 MIL/uL (ref 3.87–5.11)
RDW: 13.1 % (ref 11.5–15.5)
WBC: 9.8 10*3/uL (ref 4.0–10.5)
nRBC: 0 % (ref 0.0–0.2)

## 2020-11-29 LAB — POC URINE PREG, ED: Preg Test, Ur: NEGATIVE

## 2020-11-29 LAB — URINALYSIS, COMPLETE (UACMP) WITH MICROSCOPIC
Bilirubin Urine: NEGATIVE
Glucose, UA: >=500 mg/dL — AB
Hgb urine dipstick: NEGATIVE
Ketones, ur: NEGATIVE mg/dL
Leukocytes,Ua: NEGATIVE
Nitrite: POSITIVE — AB
Protein, ur: NEGATIVE mg/dL
Specific Gravity, Urine: 1.008 (ref 1.005–1.030)
pH: 6 (ref 5.0–8.0)

## 2020-11-29 LAB — TROPONIN I (HIGH SENSITIVITY)
Troponin I (High Sensitivity): 6 ng/L (ref <18)
Troponin I (High Sensitivity): 7 ng/L (ref <18)

## 2020-11-29 LAB — CBG MONITORING, ED: Glucose-Capillary: 95 mg/dL (ref 70–99)

## 2020-11-29 LAB — LACTIC ACID, PLASMA: Lactic Acid, Venous: 1.9 mmol/L (ref 0.5–1.9)

## 2020-11-29 MED ORDER — SODIUM CHLORIDE 0.9 % IV BOLUS: 500.0000 mL | Freq: Once | INTRAVENOUS | Status: DC

## 2020-11-29 MED ORDER — CEFDINIR 300 MG PO CAPS
300.0000 mg | ORAL_CAPSULE | Freq: Once | ORAL | Status: AC
  Administered 2020-11-29: 300 mg via ORAL
  Filled 2020-11-29 (×2): qty 1

## 2020-11-29 MED ORDER — PHENAZOPYRIDINE HCL 200 MG PO TABS: 200.0000 mg | ORAL_TABLET | Freq: Three times a day (TID) | ORAL | 0 refills | Status: DC | PRN

## 2020-11-29 MED ORDER — CEFDINIR 300 MG PO CAPS: 300.0000 mg | ORAL_CAPSULE | Freq: Two times a day (BID) | ORAL | 0 refills | Status: DC

## 2020-11-29 MED ORDER — SODIUM CHLORIDE 0.9 % IV BOLUS
1000.0000 mL | Freq: Once | INTRAVENOUS | Status: AC
  Administered 2020-11-29: 1000 mL via INTRAVENOUS

## 2020-11-29 NOTE — H&P (Signed)
[place]   PATIENT NAME: [name]    MR#:  [id number]  DATE OF BIRTH:  [birth year]  DATE OF ADMISSION:  11/29/2020  PRIMARY CARE PHYSICIAN: Steele Sizer, MD   Patient is coming from: Home  REQUESTING/REFERRING PHYSICIAN: Delman Kitten, MD  CHIEF COMPLAINT:   Chief Complaint  Patient presents with  . Urinary Tract Infection  . Dizziness    HISTORY OF PRESENT ILLNESS:  [name] is a 55 y.o. Caucasian female with medical history significant for multiple medical problems including CHF, chronic kidney disease, COPD, coronary artery disease, type 2 diabetes mellitus with diabetic neuropathy, GERD, hypertension, dyslipidemia, hypothyroidism and morbid obesity, who presented to the ER with acute onset of dizziness and generalized weakness.  Admitted to.  Urinary few as well as nausea without vomiting or abdominal pain.  No dyspnea or cough or wheezing.  No chest pain or palpitations.  No fever or chills.  She did not have any syncope at home.  No paresthesias or focal muscle weakness.  After arranging for discharge with a prescription for p.o. Omnicef for suspected UTI of blood pressure dropped and she had a syncope.  ED Course: When she came to the ER, vital signs were initially normal and later she dropped her blood pressure to 91/62 and heart rate to 50 and that later on came up to 126/69 with hydration.  And later on dropped to 67/30 and she was getting IV normal saline.  Check IV Levophed started with improvement of her blood pressure to 107/57 and later 127/74 with heart rate of 59 and respiratory to 23.  Labs revealed a BUN of 28 and creatinine 1.49 compared to 29 and 1.3 on 10/26/2020.  Lactic acid came back 1.9 and high-sensitivity troponin I was 6 and later 7.  CBC was unremarkable.  Influenza antigens and COVID-19 PCR came back negative.  She had blood culture drawn.  UA revealed more than 500 glucose and was positive for nitrite.  Urine drug screen was positive for  tricyclic. EKG as reviewed by me : Showed normal sinus rhythm with rate of 62 with minimal voltage criteria for LVH and T wave inversion inferiorly.   She will be admitted to an ICU bed for further evaluation and management. PAST MEDICAL HISTORY:   Past Medical History:  Diagnosis Date  . Anxiety   . Arthritis   . Asthma   . Bipolar depression ([place])   . CHF (congestive heart failure) ([place])   . Chronic headaches    migraines have lessened  . Chronic kidney disease    ddstage 111  . COPD (chronic obstructive pulmonary disease) ([place])   . Coronary artery disease   . Depression   . Diabetic neuropathy ([place])   . Dyspnea    uses inhalers for this  . Dysrhythmia    rapid  . Fatty liver   . GERD (gastroesophageal reflux disease)   . Headache   . Heart attack ([place]) 2009  . Hyperlipidemia   . Hypertension   . Hypomagnesemia   . Hypothyroid   . Irregular heart rhythm   . Liver lesion    Favored to be benign on CT; MRI of abd w/contract in Aug 2016. Liver Biopsy-Negative, March 2016  . Morbid obesity ([place])   . OSA (obstructive sleep apnea)   . Peripheral vascular disease ([place])   . Pinched nerve    Back  . Pneumonia   . RLS (restless legs syndrome)   . Seasonal  allergies   . Sleep apnea    uses oxygen at night 2L d/t cpap did not fit  . Type 2 diabetes, uncontrolled, with neuropathy ([place]) 10/15/2015   Overview:  Microalbumin 7.7 12/2010: Hgb A1c 10.2% 12/2010 and 9.7% 03/2011, eye exam 10/2009 [facility] DM clinic   . Urinary incontinence   . Vitamin D deficiency     PAST SURGICAL HISTORY:   Past Surgical History:  Procedure Laterality Date  . ABDOMINAL HYSTERECTOMY     Partial  . bladder stim  2007  . BLADDER SURGERY     x 2. tacking of bladder and the other was the stimulator placement  . CHOLECYSTECTOMY    . COLONOSCOPY WITH PROPOFOL N/A 03/04/2019   Procedure: COLONOSCOPY WITH PROPOFOL;  Surgeon: [name], MD;  Location[facility] ENDOSCOPY;  Service: Endoscopy;  Laterality:  N/A;  . ESOPHAGOGASTRODUODENOSCOPY (EGD) WITH PROPOFOL N/A 03/04/2019   Procedure: ESOPHAGOGASTRODUODENOSCOPY (EGD) WITH PROPOFOL;  Surgeon: [name], MD;  Location: [facility] ENDOSCOPY;  Service: Endoscopy;  Laterality: N/A;  . INCISION AND DRAINAGE ABSCESS Right 04/16/2019   Procedure: INCISION AND DRAINAGE ABSCESS;  Surgeon: Benjamine Sprague, DO;  Location: [facility] ORS;  Service: General;  Laterality: Right;  . INTERSTIM-GENERATOR CHANGE N/A 12/31/2017   Procedure: INTERSTIM-GENERATOR CHANGE;  Surgeon: Bjorn Loser, MD;  Location: [facility] ORS;  Service: Urology;  Laterality: N/A;  . LIVER BIOPSY  March 2016   Negative  . LUNG BIOPSY  2015   small spot on lung  . TUBAL LIGATION    . UPPER GI ENDOSCOPY    . WRIST SURGERY Right 11/02/2019    SOCIAL HISTORY:   Social History   Tobacco Use  . Smoking status: Never Smoker  . Smokeless tobacco: Never Used  Substance Use Topics  . Alcohol use: No    FAMILY HISTORY:   Family History  Problem Relation Age of Onset  . Diabetes Mother   . Heart disease Mother   . Hyperlipidemia Mother   . Hypertension Mother   . Kidney disease Mother   . Mental illness Mother   . Hypothyroidism Mother   . Stroke Mother   . Osteoporosis Mother   . Glaucoma Mother   . Congestive Heart Failure Mother   . Hypertension Father   . Asthma Daughter   . Cancer Daughter        throat  . Bipolar disorder Daughter   . Heart disease Maternal Grandfather   . Rheum arthritis Maternal Grandfather   . Cancer Maternal Grandfather        liver  . Kidney disease Maternal Grandfather   . Cancer Paternal Grandmother        lung  . Kidney disease Brother   . Breast cancer Maternal Grandmother 60  . Bipolar disorder Daughter   . Hypertension Daughter   . Restless legs syndrome Daughter     DRUG ALLERGIES:   Allergies  Allergen Reactions  . Mirtazapine Anaphylaxis    REACTION: closes throat  . Morphine And Related     anaphylaxis  .  Sulfamethoxazole-Trimethoprim Rash    REACTION: Whelps all over  . Vicodin Hp [Hydrocodone-Acetaminophen]   . Amoxicillin Er Nausea And Vomiting    Pt doesn't remember problem with amoxicillin -  SE Has patient had a PCN reaction causing immediate rash, facial/tongue/throat swelling, SOB or lightheadedness with hypotension: No Has patient had a PCN reaction causing severe rash involving mucus membranes or skin necrosis: No Has patient had a PCN reaction that required hospitalization: No Has patient  had a PCN reaction occurring within the last 10 years: Yes If all of the above answers are "NO", then may proceed with Cephalosporin use.  . Codeine Itching    REACTION: itch, rash  . Hydrocodone-Acetaminophen Rash    REACTION: rash  . Keflex [Cephalexin] Rash    Pt does not remember having a reaction or rash, no airway involvement  . Prasterone Nausea And Vomiting    REVIEW OF SYSTEMS:   ROS As per history of present illness. All pertinent systems were reviewed above. Constitutional, HEENT, cardiovascular, respiratory, GI, GU, musculoskeletal, neuro, psychiatric, endocrine, integumentary and hematologic systems were reviewed and are otherwise negative/unremarkable except for positive findings mentioned above in the HPI.   MEDICATIONS AT HOME:   Prior to Admission medications   Medication Sig Start Date End Date Taking? Authorizing Provider  cefdinir (OMNICEF) 300 MG capsule Take 1 capsule (300 mg total) by mouth 2 (two) times daily for 5 days. 11/29/20 12/04/20 Yes Naaman Plummer, MD  phenazopyridine (PYRIDIUM) 200 MG tablet Take 1 tablet (200 mg total) by mouth 3 (three) times daily as needed for pain. 11/29/20 11/29/21 Yes Naaman Plummer, MD  atorvastatin (LIPITOR) 80 MG tablet Take 80 mg by mouth at bedtime.     [provider]  busPIRone (BUSPAR) 10 MG tablet Take 20 mg by mouth 2 (two) times daily.    [facility]  busPIRone (BUSPAR) 30 MG tablet Take 30 mg by  mouth 2 (two) times daily. 11/09/20   [provider]  citalopram (CELEXA) 20 MG tablet Take 20 mg by mouth daily. 01/16/20   [facility] [place]  dexlansoprazole (DEXILANT) 60 MG capsule Take 1 capsule (60 mg total) by mouth daily. 10/21/20   [name], MD  ezetimibe (ZETIA) 10 MG tablet Take 10 mg by mouth daily.  01/05/17   [provider]  fenofibrate micronized (LOFIBRA) 134 MG capsule Take 134 mg by mouth daily.    [provider]  gabapentin (NEURONTIN) 300 MG capsule Take 300 mg by mouth 2 (two) times daily.    [provider]  gabapentin (NEURONTIN) 600 MG tablet Take 600 mg by mouth daily. 06/15/20   [provider]  GLYXAMBI 25-5 MG TABS Take 1 tablet by mouth daily. 10/01/20   [provider]  insulin aspart (NOVOLOG FLEXPEN) 100 UNIT/ML FlexPen Inject 3 Units subcutaneously. Sliding scale.    [provider]  insulin regular human CONCENTRATED (HUMULIN R) 500 UNIT/ML injection Inject 70 Units into the skin 3 (three) times daily with meals.    [provider]  ipratropium (ATROVENT) 0.03 % nasal spray Place 2 sprays into both nostrils every 12 (twelve) hours. 08/03/20   [name], PA-C  lamoTRIgine (LAMICTAL) 200 MG tablet Take 200 mg by mouth 2 (two) times daily.     [provider]  lisinopril (ZESTRIL) 2.5 MG tablet Take 1 tablet (2.5 mg total) by mouth daily. 11/04/20   Steele Sizer, MD  metoprolol tartrate (LOPRESSOR) 25 MG tablet Take 0.5 tablets (12.5 mg total) by mouth 2 (two) times daily. 06/08/20 09/06/20  Marrianne Mood D, PA-C  nitroGLYCERIN (NITROSTAT) 0.4 MG SL tablet Place 1 tablet (0.4 mg total) under the tongue every 5 (five) minutes as needed for chest pain. 10/13/20   Minna Merritts, MD  OneTouch Delica Lancets 02I MISC by Does not apply route.    [provider]  promethazine (PHENERGAN) 12.5 MG tablet Take 1 tablet (12.5 mg total) by mouth every 6 (  six) hours as needed for  nausea or vomiting. 10/21/20   [name], MD  QUEtiapine (SEROQUEL) 200 MG tablet Take 200 mg by mouth at bedtime. Along with 370m dose for a total of 5058m   [provider]  SYNTHROID 100 MCG tablet Take 100 mcg by mouth daily. 10/01/20   [provider]  TRULICITY 1.5 MGGD/9.2EQOPN SMARTSIG:0.5 Milliliter(s) SUB-Q Once a Week 10/01/20   [provider]      VITAL SIGNS:  Blood pressure (!) 73/47, pulse (!) 58, temperature 97.9 F (36.6 C), temperature source Oral, resp. rate 18, height 4' 11"  (1.499 m), weight 90.7 kg, SpO2 97 %.  PHYSICAL EXAMINATION:  Physical Exam  GENERAL: Acutely ill [age].o.-year-old Caucasian female patient lying in the bed with no respiratory distress.  She was lethargic but would easily fall asleep. EYES: Pupils equal, round, reactive to light and accommodation. No scleral icterus. Extraocular muscles intact.  HEENT: Head atraumatic, normocephalic. Oropharynx and nasopharynx clear.  NECK:  Supple, no jugular venous distention. No thyroid enlargement, no tenderness.  LUNGS: Normal breath sounds bilaterally, no wheezing, rales,rhonchi or crepitation. No use of accessory muscles of respiration.  CARDIOVASCULAR: Regular rate and rhythm, S1, S2 normal. No murmurs, rubs, or gallops.  ABDOMEN: Soft, nondistended, nontender. Bowel sounds present. No organomegaly or mass.  EXTREMITIES: No pedal edema, cyanosis, or clubbing.  NEUROLOGIC: Cranial nerves II through XII are intact. Muscle strength 5/5 in all extremities. Sensation intact. Gait not checked.  PSYCHIATRIC: The patient is alert and oriented x 3.  Normal affect and good eye contact. SKIN: No obvious rash, lesion, or ulcer.   LABORATORY PANEL:   CBC Recent Labs  Lab 11/29/20 1921  WBC 9.8  HGB 12.8  HCT 41.7  PLT 315   ------------------------------------------------------------------------------------------------------------------  Chemistries  Recent Labs  Lab  11/29/20 2225  NA 139  K 4.2  CL 104  CO2 26  GLUCOSE 87  BUN 28*  CREATININE 1.49*  CALCIUM 9.8  AST 29  ALT 26  ALKPHOS 43  BILITOT 0.5   ------------------------------------------------------------------------------------------------------------------  Cardiac Enzymes No results for input(s): TROPONINI in the last 168 hours. ------------------------------------------------------------------------------------------------------------------  RADIOLOGY:  No results found.    IMPRESSION AND PLAN:  Active Problems:   Syncope 1.  Hypotension and possible shock, likely hypovolemic and possibly septic.  She has suspected UTI that could be the source. -The patient will be aggressively hydrated with IV normal saline 30 mill per kilogram. -She was started on IV Levophed with improvement of her blood pressure while getting IV fluids.  We will monitor her response to tapering off of IV Levophed after her bolus. -Should she come off of IV Levophed she will be admitted to medical monitored observation bed. -If she still needs it we will admit her to an ICU bed. -We will continue broad-spectrum management for her UTI pending urine and blood cultures, with IV Azactam given allergy to cephalosporins and penicillins.  Addendum: The patient received 3 L of IV fluids and was ordered midodrine.  2 attempts at tapering off IV Levophed failed and her blood pressure would drop significantly from 4068Tystolic to 8041D She was maintained on IV fluid and will be admitted to an ICU bed.  I discussed her case with the ICU team and placed a consult.  2.  Syncope clearly secondary to her hypotension and shock. -Management as above. -We will continue to monitor her for arrhythmias. -We will obtain a D-dimer level. -We will check her  orthostatics when her blood pressure stabilizes.  3.  Essential hypertension. -We will hold off her antihypertensives especially Zestril.  Lopressor can be continued with  improved blood pressure and holding parameters.  4.  Dyslipidemia. -We will continue her statin therapy, fenofibrate and Zetia.  5.  Anxiety, depression and bipolar disorder. -We will continue her BuSpar, Celexa and Lamictal as well as Seroquel.  6.  Hypothyroidism. -We will continue Synthroid and check TSH.  DVT prophylaxis: Lovenox. Code Status: full code. Family Communication:  The plan of care was discussed in details with the patient (and family). I answered all questions. The patient agreed to proceed with the above mentioned plan. Further management will depend upon hospital course. Disposition Plan: Back to previous home environment Consults called: none. All the records are reviewed and case discussed with ED provider.  Status is: Inpatient  Status given severity of her case in the setting of severe hypotension and shock possibly septic, requiring IV pressor therapy with Levophed and ICU admission.  Dispo: The patient is from: Home              Anticipated d/c is to: Home              Patient currently is not medically stable to d/c.   Difficult to place patient No  Authorized and performed by: Eugenie Norrie, MD Total critical care time: Approximately  80  minutes. Due to a high probability of clinically significant, life-threatening deterioration, the patient required my highest level of preparedness to intervene emergently and I personally spent this critical care time directly and personally managing the patient.  This critical care time included obtaining a history, examining the patient, pulse oximetry, ordering and review of studies, arranging urgent treatment with development of management plan, evaluation of patient's response to treatment, frequent reassessment, and discussions with other providers. This critical care time was performed to assess and manage the high probability of imminent, life-threatening deterioration that could result in multiorgan failure.  It was  exclusive of separately billable procedures and treating other patients and teaching time.  Please see MDM section and the rest of the note for further information on patient assessment and treatment.   Christel Mormon M.D on 11/29/2020 at 11:42 PM  [facility]   From 7 PM-7 AM, contact night-coverage [email or web address]  CC: Primary care physician; Steele Sizer, MD

## 2020-11-29 NOTE — ED Notes (Signed)
Pt placed on purewick at this time.

## 2020-11-29 NOTE — ED Notes (Addendum)
This RN attempted to discharge pt. Pt had a change in mental status. Pt became lethargic and pt BP was 78/53. Jacqualine Code, MD notified and reassessed. Jacqualine Code, MD has decided to admit pt.

## 2020-11-29 NOTE — Progress Notes (Deleted)
Name: [name]   MRN: [id number]    DOB: March 15, 1966   Date:11/29/2020       Progress Note  Subjective  Chief Complaint  UTI  HPI    Patient Active Problem List   Diagnosis Date Noted  . Chronic congestive heart failure ([place]) 11/04/2020  . Iron deficiency anemia due to chronic blood loss   . Polyp of ascending colon   . Chronic gastric ulcer with hemorrhage   . Vitamin B12 deficiency 08/08/2018  . Patient is full code 10/30/2017  . Coronary artery disease 10/09/2017  . Plantar fascial fibromatosis 10/09/2017  . Tendinitis of wrist 10/09/2017  . Schizoaffective disorder, bipolar type ([place]) 09/28/2017  . 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 Breast cancer screening 09/28/2016  . Adenomatous colon polyp 10/15/2015  . Bipolar affective disorder ([place]) 10/15/2015  . CN (constipation) 10/15/2015  . Drug-induced hepatic toxicity 10/15/2015  . HLD (hyperlipidemia) 10/15/2015  . Hypoxemia 10/15/2015  . NASH (nonalcoholic steatohepatitis) 10/15/2015  . Morbid obesity ([place]) 10/15/2015  . Restless leg syndrome 10/15/2015  . Type 2 diabetes, uncontrolled, with neuropathy ([place]) 10/15/2015  . Elevated alkaline phosphatase level 09/22/2015  . Low back pain with left-sided sciatica 08/18/2015  . Apnea, sleep 05/05/2015  . Heart valve disease 05/05/2015  . Chronic kidney disease, stage III (moderate) (HCC) 03/15/2015  . Migraine 03/12/2015  . COPD (chronic obstructive pulmonary disease) ([place]) 01/20/2015  . 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[place]) 01/20/2015  . GERD (gastroesophageal reflux disease) 01/20/2015  . Hypothyroid   . Vitamin D deficiency   . Urinary incontinence   . Liver lesion   . Hypomagnesemia   . Obstructive sleep apnea 10/20/2013  . PERIPHERAL NEUROPATHY 03/04/2009  . INTERSTITIAL CYSTITIS 07/04/2007  . Allergic rhinitis 07/04/2007    Past Surgical History:  Procedure Laterality Date  . ABDOMINAL HYSTERECTOMY     Partial  . bladder stim   2007  . BLADDER SURGERY     x 2. tacking of bladder and the other was the stimulator placement  . CHOLECYSTECTOMY    . COLONOSCOPY WITH PROPOFOL N/A 03/04/2019   Procedure: COLONOSCOPY WITH PROPOFOL;  Surgeon: [name], MD;  Location: [facility] ENDOSCOPY;  Service: Endoscopy;  Laterality: N/A;  . ESOPHAGOGASTRODUODENOSCOPY (EGD) WITH PROPOFOL N/A 03/04/2019   Procedure: ESOPHAGOGASTRODUODENOSCOPY (EGD) WITH PROPOFOL;  Surgeon: [name], MD;  Location: [facility] ENDOSCOPY;  Service: Endoscopy;  Laterality: N/A;  . INCISION AND DRAINAGE ABSCESS Right 04/16/2019   Procedure: INCISION AND DRAINAGE ABSCESS;  Surgeon: Benjamine Sprague, DO;  Location: [facility] ORS;  Service: General;  Laterality: Right;  . INTERSTIM-GENERATOR CHANGE N/A 12/31/2017   Procedure: INTERSTIM-GENERATOR CHANGE;  Surgeon: Bjorn Loser, MD;  Location: [facility] ORS;  Service: Urology;  Laterality: N/A;  . LIVER BIOPSY  March 2016   Negative  . LUNG BIOPSY  2015   small spot on lung  . TUBAL LIGATION    . UPPER GI ENDOSCOPY    . WRIST SURGERY Right 11/02/2019    Family History  Problem Relation Age of Onset  . Diabetes Mother   . Heart disease Mother   . Hyperlipidemia Mother   . Hypertension Mother   . Kidney disease Mother   . Mental illness Mother   . Hypothyroidism Mother   . Stroke Mother   . Osteoporosis Mother   . Glaucoma Mother   . Congestive Heart Failure Mother   . Hypertension Father   . Asthma Daughter   . Cancer Daughter  throat  . Bipolar disorder Daughter   . Heart disease Maternal Grandfather   . Rheum arthritis Maternal Grandfather   . Cancer Maternal Grandfather        liver  . Kidney disease Maternal Grandfather   . Cancer Paternal Grandmother        lung  . Kidney disease Brother   . Breast cancer Maternal Grandmother 60  . Bipolar disorder Daughter   . Hypertension Daughter   . Restless legs syndrome Daughter     Social History   Tobacco Use  . Smoking status: Never Smoker  .  Smokeless tobacco: Never Used  Substance Use Topics  . Alcohol use: No     Current Outpatient Medications:  .  atorvastatin (LIPITOR) 80 MG tablet, Take 80 mg by mouth at bedtime. , Disp: , Rfl:  .  busPIRone (BUSPAR) 10 MG tablet, Take 20 mg by mouth 2 (two) times daily., Disp: , Rfl:  .  citalopram (CELEXA) 20 MG tablet, Take 20 mg by mouth daily., Disp: , Rfl:  .  dexlansoprazole (DEXILANT) 60 MG capsule, Take 1 capsule (60 mg total) by mouth daily., Disp: 30 capsule, Rfl: 6 .  ezetimibe (ZETIA) 10 MG tablet, Take 10 mg by mouth daily. , Disp: , Rfl:  .  fenofibrate micronized (LOFIBRA) 134 MG capsule, Take 134 mg by mouth daily., Disp: , Rfl:  .  gabapentin (NEURONTIN) 300 MG capsule, Take 300 mg by mouth 2 (two) times daily., Disp: , Rfl:  .  gabapentin (NEURONTIN) 600 MG tablet, Take 600 mg by mouth daily., Disp: , Rfl:  .  GLYXAMBI 25-5 MG TABS, Take 1 tablet by mouth daily., Disp: , Rfl:  .  insulin aspart (NOVOLOG FLEXPEN) 100 UNIT/ML FlexPen, Inject 3 Units subcutaneously. Sliding scale., Disp: , Rfl:  .  insulin regular human CONCENTRATED (HUMULIN R) 500 UNIT/ML injection, Inject 70 Units into the skin 3 (three) times daily with meals., Disp: , Rfl:  .  ipratropium (ATROVENT) 0.03 % nasal spray, Place 2 sprays into both nostrils every 12 (twelve) hours., Disp: 30 mL, Rfl: 1 .  lamoTRIgine (LAMICTAL) 200 MG tablet, Take 200 mg by mouth 2 (two) times daily. , Disp: , Rfl:  .  lisinopril (ZESTRIL) 2.5 MG tablet, Take 1 tablet (2.5 mg total) by mouth daily., Disp: 90 tablet, Rfl: 2 .  metoprolol tartrate (LOPRESSOR) 25 MG tablet, Take 0.5 tablets (12.5 mg total) by mouth 2 (two) times daily., Disp: 30 tablet, Rfl: 5 .  nitroGLYCERIN (NITROSTAT) 0.4 MG SL tablet, Place 1 tablet (0.4 mg total) under the tongue every 5 (five) minutes as needed for chest pain., Disp: 25 tablet, Rfl: 0 .  OneTouch Delica Lancets 88K MISC, by Does not apply route., Disp: , Rfl:  .  promethazine (PHENERGAN)  12.5 MG tablet, Take 1 tablet (12.5 mg total) by mouth every 6 (six) hours as needed for nausea or vomiting., Disp: 30 tablet, Rfl: 1 .  QUEtiapine (SEROQUEL) 200 MG tablet, Take 200 mg by mouth at bedtime. Along with 362m dose for a total of 5065m Disp: , Rfl:  .  SYNTHROID 100 MCG tablet, Take 100 mcg by mouth daily., Disp: , Rfl:  .  TRULICITY 1.5 MGCM/0.3KJOPN, SMARTSIG:0.5 Milliliter(s) SUB-Q Once a Week, Disp: , Rfl:   Allergies  Allergen Reactions  . Mirtazapine Anaphylaxis    REACTION: closes throat  . Morphine And Related     anaphylaxis  . Sulfamethoxazole-Trimethoprim Rash    REACTION: [place] all over  .  Vicodin Hp [Hydrocodone-Acetaminophen]   . Amoxicillin Er Nausea And Vomiting    Pt doesn't remember problem with amoxicillin -  SE Has patient had a PCN reaction causing immediate rash, facial/tongue/throat swelling, SOB or lightheadedness with hypotension: No Has patient had a PCN reaction causing severe rash involving mucus membranes or skin necrosis: No Has patient had a PCN reaction that required hospitalization: No Has patient had a PCN reaction occurring within the last 10 years: Yes If all of the above answers are "NO", then may proceed with Cephalosporin use.  . Codeine Itching    REACTION: itch, rash  . Hydrocodone-Acetaminophen Rash    REACTION: rash  . Keflex [Cephalexin] Rash    Pt does not remember having a reaction or rash, no airway involvement  . Prasterone Nausea And Vomiting    I personally reviewed {Reviewed:14835} with the patient/caregiver today.   ROS  ***  Objective  There were no vitals filed for this visit.  There is no height or weight on file to calculate BMI.  Physical Exam ***  Recent Results (from the past 2160 hour(s))  Basic metabolic panel     Status: Abnormal   Collection Time: 09/16/20  1:35 PM  Result Value Ref Range   Sodium 144 135 - 145 mmol/L   Potassium 4.1 3.5 - 5.1 mmol/L   Chloride 106 98 - 111 mmol/L   CO2  26 22 - 32 mmol/L   Glucose, Bld 180 (H) 70 - 99 mg/dL    Comment: Glucose reference range applies only to samples taken after fasting for at least 8 hours.   BUN 20 6 - 20 mg/dL   Creatinine, Ser 1.03 (H) 0.44 - 1.00 mg/dL   Calcium 8.8 (L) 8.9 - 10.3 mg/dL   GFR, Estimated >60 >60 mL/min    Comment: (NOTE) Calculated using the CKD-EPI Creatinine Equation (2021)    Anion gap 12 5 - 15    Comment: Performed at [facility], [address] [place], [place] [address]  CBC     Status: Abnormal   Collection Time: 09/16/20  1:35 PM  Result Value Ref Range   WBC 6.2 4.0 - 10.5 K/uL   RBC 3.98 3.87 - 5.11 MIL/uL   Hemoglobin 11.8 (L) 12.0 - 15.0 g/dL   HCT 37.1 36.0 - 46.0 %   MCV 93.2 80.0 - 100.0 fL   MCH 29.6 26.0 - 34.0 pg   MCHC 31.8 30.0 - 36.0 g/dL   RDW 14.4 11.5 - 15.5 %   Platelets 230 150 - 400 K/uL   nRBC 0.0 0.0 - 0.2 %    Comment: Performed at [facility], [place], [place] [address]  Troponin I (High Sensitivity)     Status: None   Collection Time: 09/16/20  1:35 PM  Result Value Ref Range   Troponin I (High Sensitivity) 6 <18 ng/L    Comment: (NOTE) Elevated high sensitivity troponin I (hsTnI) values and significant  changes across serial measurements may suggest ACS but many other  chronic and acute conditions are known to elevate hsTnI results.  Refer to the "Links" section for chest pain algorithms and additional  guidance. Performed at [facility], [address] [place], [place] [address]   Brain natriuretic peptide     Status: None   Collection Time: 09/16/20  1:35 PM  Result Value Ref Range   B Natriuretic Peptide 55.1 0.0 - 100.0 pg/mL    Comment: Performed at [facility], [place], [place] [address]  Procalcitonin - Baseline     Status: None   Collection Time: 09/16/20  1:35 PM  Result Value Ref Range   Procalcitonin <0.10 ng/mL    Comment:        Interpretation: PCT  (Procalcitonin) <= 0.5 ng/mL: Systemic infection (sepsis) is not likely. Local bacterial infection is possible. (NOTE)       Sepsis PCT Algorithm           Lower Respiratory Tract                                      Infection PCT Algorithm    ----------------------------     ----------------------------         PCT < 0.25 ng/mL                PCT < 0.10 ng/mL          Strongly encourage             Strongly discourage   discontinuation of antibiotics    initiation of antibiotics    ----------------------------     -----------------------------       PCT 0.25 - 0.50 ng/mL            PCT 0.10 - 0.25 ng/mL               OR       >80% decrease in PCT            Discourage initiation of                                            antibiotics      Encourage discontinuation           of antibiotics    ----------------------------     -----------------------------         PCT >= 0.50 ng/mL              PCT 0.26 - 0.50 ng/mL               AND        <80% decrease in PCT             Encourage initiation of                                             antibiotics       Encourage continuation           of antibiotics    ----------------------------     -----------------------------        PCT >= 0.50 ng/mL                  PCT > 0.50 ng/mL               AND         increase in PCT                  Strongly encourage  initiation of antibiotics    Strongly encourage escalation           of antibiotics                                     -----------------------------                                           PCT <= 0.25 ng/mL                                                 OR                                        > 80% decrease in PCT                                      Discontinue / Do not initiate                                             antibiotics  Performed at [facility], [address] [place], [place] [address]   Magnesium      Status: Abnormal   Collection Time: 09/16/20  4:30 PM  Result Value Ref Range   Magnesium 1.6 (L) 1.7 - 2.4 mg/dL    Comment: Performed at [facility], [place], [place] [address]  Troponin I (High Sensitivity)     Status: None   Collection Time: 09/16/20  5:58 PM  Result Value Ref Range   Troponin I (High Sensitivity) 6 <18 ng/L    Comment: (NOTE) Elevated high sensitivity troponin I (hsTnI) values and significant  changes across serial measurements may suggest ACS but many other  chronic and acute conditions are known to elevate hsTnI results.  Refer to the "Links" section for chest pain algorithms and additional  guidance. Performed at [facility], [place], [place] [address]   Resp Panel by RT-PCR (Flu A&B, Covid) Nasopharyngeal Swab     Status: None   Collection Time: 09/16/20  5:58 PM   Specimen: Nasopharyngeal Swab; Nasopharyngeal(NP) swabs in vial transport medium  Result Value Ref Range   SARS Coronavirus 2 by RT PCR NEGATIVE NEGATIVE    Comment: (NOTE) SARS-CoV-2 target nucleic acids are NOT DETECTED.  The SARS-CoV-2 RNA is generally detectable in upper respiratory specimens during the acute phase of infection. The lowest concentration of SARS-CoV-2 viral copies this assay can detect is 138 copies/mL. A negative result does not preclude SARS-Cov-2 infection and should not be used as the sole basis for treatment or other patient management decisions. A negative result may occur with  improper specimen collection/handling, submission of specimen other than nasopharyngeal swab, presence of viral mutation(s) within the areas targeted by this assay, and inadequate number of viral copies(<138 copies/mL). A negative result must be combined with  clinical observations, patient history, and epidemiological information. The expected result is Negative.  Fact Sheet for Patients:   EntrepreneurPulse.com.au  Fact Sheet for Healthcare Providers:  IncredibleEmployment.be  This test is no t yet approved or cleared by the [place] FDA and  has been authorized for detection and/or diagnosis of SARS-CoV-2 by FDA under an Emergency Use Authorization (EUA). This EUA will remain  in effect (meaning this test can be used) for the duration of the COVID-19 declaration under Section 564(b)(1) of the Act, 21 U.S.C.section 360bbb-3(b)(1), unless the authorization is terminated  or revoked sooner.       Influenza A by PCR NEGATIVE NEGATIVE   Influenza B by PCR NEGATIVE NEGATIVE    Comment: (NOTE) The Xpert Xpress SARS-CoV-2/FLU/RSV plus assay is intended as an aid in the diagnosis of influenza from Nasopharyngeal swab specimens and should not be used as a sole basis for treatment. Nasal washings and aspirates are unacceptable for Xpert Xpress SARS-CoV-2/FLU/RSV testing.  Fact Sheet for Patients: EntrepreneurPulse.com.au  Fact Sheet for Healthcare Providers: IncredibleEmployment.be  This test is not yet approved or cleared by the [place] FDA and has been authorized for detection and/or diagnosis of SARS-CoV-2 by FDA under an Emergency Use Authorization (EUA). This EUA will remain in effect (meaning this test can be used) for the duration of the COVID-19 declaration under Section 564(b)(1) of the Act, 21 U.S.C. section 360bbb-3(b)(1), unless the authorization is terminated or revoked.  Performed at [facility], [address] [place], [place] [address]   Iron, TIBC and Ferritin Panel     Status: None   Collection Time: 10/26/20 12:00 AM  Result Value Ref Range   Ferritin 89   CBC and differential     Status: Abnormal   Collection Time: 10/26/20 12:00 AM  Result Value Ref Range   Hemoglobin 11.8 (A) 12.0 - 16.0   HCT 37 36 - 46   Platelets 264 150 - 399   WBC 8.2   CBC      Status: None   Collection Time: 10/26/20 12:00 AM  Result Value Ref Range   RBC 3.96 3.87 - 4.43  Basic metabolic panel     Status: Abnormal   Collection Time: 10/26/20 12:00 AM  Result Value Ref Range   Glucose 188    BUN 29 (A) 4 - 21   Creatinine 1.3 (A) 0.5 - 1.1   Potassium 4.9 3.4 - 5.3   Sodium 139 137 - 147   Chloride 102 99 - 108  Comprehensive metabolic panel     Status: None   Collection Time: 10/26/20 12:00 AM  Result Value Ref Range   GFR calc Af Amer 55    GFR calc non Af Amer 48   Lipid panel     Status: Abnormal   Collection Time: 10/26/20 12:00 AM  Result Value Ref Range   Triglycerides 226 (A) 40 - 160   HDL 27 (A) 35 - 70   LDL Cholesterol 37   Hepatic function panel     Status: None   Collection Time: 10/26/20 12:00 AM  Result Value Ref Range   Alkaline Phosphatase 50 25 - 125   ALT 18 7 - 35   AST 23 13 - 35   Bilirubin, Total 0.2   Hemoglobin A1c     Status: None   Collection Time: 10/26/20 12:00 AM  Result Value Ref Range   Hemoglobin A1C 8.7   TSH     Status: Abnormal  Collection Time: 10/26/20 12:00 AM  Result Value Ref Range   TSH 0.25 (A) 0.41 - 5.90  NM Myocar Multi W/Spect W/Wall Motion / EF     Status: None   Collection Time: 10/29/20 10:48 AM  Result Value Ref Range   Rest HR 87 bpm   Rest BP 119/74 mmHg   Percent HR 61 %   Peak HR 100 bpm   Peak BP 124/71 mmHg   MPHR 163 bpm   SSS 5    SRS 1    SDS 1    TID 0.89    LV sys vol 22 mL   LV dias vol 58 46 - 106 mL    Diabetic Foot Exam: Diabetic Foot Exam - Simple   No data filed    ***  PHQ2/9: Depression screen [facility] 2/9 11/04/2020 08/17/2020 08/03/2020 07/13/2020 06/15/2020  Decreased Interest 2 0 0 0 2  Down, Depressed, Hopeless 2 - 0 0 2  PHQ - 2 Score 4 0 0 0 4  Altered sleeping 0 0 0 0 2  Tired, decreased energy 2 0 0 0 2  Change in appetite 3 2 0 0 2  Feeling bad or failure about yourself  2 0 0 0 0  Trouble concentrating 1 0 0 0 3  Moving slowly or  fidgety/restless 2 0 0 0 1  Suicidal thoughts 0 0 0 0 0  PHQ-9 Score 14 2 0 0 14  Difficult doing work/chores Not difficult at all Not difficult at all Not difficult at all Not difficult at all Not difficult at all  Some recent data might be hidden    phq 9 is {gen pos UTM:546503} ***  Fall Risk: Fall Risk  11/04/2020 08/17/2020 08/03/2020 07/13/2020 06/15/2020  Falls in the past year? 0 0 0 0 1  Comment - - - - -  Number falls in past yr: 0 0 0 0 1  Comment - - - - -  Injury with Fall? 0 0 0 0 0  Comment - - - - -  Risk for fall due to : - - - - Impaired balance/gait;Impaired mobility;History of fall(s)  Follow up - - Falls evaluation completed Falls evaluation completed -   ***   Functional Status Survey:   ***   Assessment & Plan  *** There are no diagnoses linked to this encounter.

## 2020-11-29 NOTE — ED Triage Notes (Signed)
Pt comes into the ED via EMS from home with c/o dizziness upon standing, pt is concerned about possible UTI, increased urinary frequency, painful urination  HR50 166/76 94%RA CBG188 98.5 Temp

## 2020-11-29 NOTE — ED Provider Notes (Signed)
[facility] Emergency Department Provider Note   ____________________________________________   Event Date/Time   First MD Initiated Contact with Patient 11/29/20 2021     (approximate)  I have reviewed the triage vital signs and the nursing notes.   HISTORY  Chief Complaint Urinary Tract Infection and Dizziness    HPI [name] is a 55 y.o. female with a past medical history of of COPD, CAD, depression, hypertension, and CHF who presents for 2 days of burning with urination and associated dizziness that began last night.  Patient states that she has been feeling increasingly lightheaded over the last 24 hours and feels that she is more tired than usual.  Patient denies any actual loss of consciousness.  Patient currently denies any vision changes, tinnitus, difficulty speaking, facial droop, sore throat, chest pain, shortness of breath, abdominal pain, nausea/vomiting/diarrhea, or weakness/numbness/paresthesias in any extremity         Past Medical History:  Diagnosis Date  . Anxiety   . Arthritis   . Asthma   . Bipolar depression ([place])   . CHF (congestive heart failure) ([place])   . Chronic headaches    migraines have lessened  . Chronic kidney disease    ddstage 111  . COPD (chronic obstructive pulmonary disease) ([place])   . Coronary artery disease   . Depression   . Diabetic neuropathy ([place])   . Dyspnea    uses inhalers for this  . Dysrhythmia    rapid  . Fatty liver   . GERD (gastroesophageal reflux disease)   . Headache   . Heart attack ([place]) 2009  . Hyperlipidemia   . Hypertension   . Hypomagnesemia   . Hypothyroid   . Irregular heart rhythm   . Liver lesion    Favored to be benign on CT; MRI of abd w/contract in Aug 2016. Liver Biopsy-Negative, March 2016  . Morbid obesity ([place])   . OSA (obstructive sleep apnea)   . Peripheral vascular disease ([place])   . Pinched nerve    Back  . Pneumonia   . RLS (restless legs syndrome)    . Seasonal allergies   . Sleep apnea    uses oxygen at night 2L d/t cpap did not fit  . Type 2 diabetes, uncontrolled, with neuropathy ([place]) 10/15/2015   Overview:  Microalbumin 7.7 12/2010: Hgb A1c 10.2% 12/2010 and 9.7% 03/2011, eye exam 10/2009 [facility] DM clinic   . Urinary incontinence   . Vitamin D deficiency     Patient Active Problem List   Diagnosis Date Noted  . Chronic congestive heart failure ([place]) 11/04/2020  . Iron deficiency anemia due to chronic blood loss   . Polyp of ascending colon   . Chronic gastric ulcer with hemorrhage   . Vitamin B12 deficiency 08/08/2018  . Patient is full code 10/30/2017  . Coronary artery disease 10/09/2017  . Plantar fascial fibromatosis 10/09/2017  . Tendinitis of wrist 10/09/2017  . Schizoaffective disorder, bipolar type ([place]) 09/28/2017  . 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 Breast cancer screening 09/28/2016  . Adenomatous colon polyp 10/15/2015  . Bipolar affective disorder ([place]) 10/15/2015  . CN (constipation) 10/15/2015  . Drug-induced hepatic toxicity 10/15/2015  . HLD (hyperlipidemia) 10/15/2015  . Hypoxemia 10/15/2015  . NASH (nonalcoholic steatohepatitis) 10/15/2015  . Morbid obesity ([place]) 10/15/2015  . Restless leg syndrome 10/15/2015  . Type 2 diabetes, uncontrolled, with neuropathy ([place]) 10/15/2015  . Elevated alkaline phosphatase level 09/22/2015  .  Low back pain with left-sided sciatica 08/18/2015  . Apnea, sleep 05/05/2015  . Heart valve disease 05/05/2015  . Chronic kidney disease, stage III (moderate) (HCC) 03/15/2015  . Migraine 03/12/2015  . COPD (chronic obstructive pulmonary disease) ([place]) 01/20/2015  . 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[place]) 01/20/2015  . GERD (gastroesophageal reflux disease) 01/20/2015  . Hypothyroid   . Vitamin D deficiency   . Urinary incontinence   . Liver lesion   . Hypomagnesemia   . Obstructive sleep apnea 10/20/2013  . PERIPHERAL NEUROPATHY 03/04/2009  .  INTERSTITIAL CYSTITIS 07/04/2007  . Allergic rhinitis 07/04/2007    Past Surgical History:  Procedure Laterality Date  . ABDOMINAL HYSTERECTOMY     Partial  . bladder stim  2007  . BLADDER SURGERY     x 2. tacking of bladder and the other was the stimulator placement  . CHOLECYSTECTOMY    . COLONOSCOPY WITH PROPOFOL N/A 03/04/2019   Procedure: COLONOSCOPY WITH PROPOFOL;  Surgeon: [name], MD;  Location: [facility] ENDOSCOPY;  Service: Endoscopy;  Laterality: N/A;  . ESOPHAGOGASTRODUODENOSCOPY (EGD) WITH PROPOFOL N/A 03/04/2019   Procedure: ESOPHAGOGASTRODUODENOSCOPY (EGD) WITH PROPOFOL;  Surgeon: [name], MD;  Location: [facility] ENDOSCOPY;  Service: Endoscopy;  Laterality: N/A;  . INCISION AND DRAINAGE ABSCESS Right 04/16/2019   Procedure: INCISION AND DRAINAGE ABSCESS;  Surgeon: Benjamine Sprague, DO;  Location: [facility] ORS;  Service: General;  Laterality: Right;  . INTERSTIM-GENERATOR CHANGE N/A 12/31/2017   Procedure: INTERSTIM-GENERATOR CHANGE;  Surgeon: Bjorn Loser, MD;  Location: [facility] ORS;  Service: Urology;  Laterality: N/A;  . LIVER BIOPSY  March 2016   Negative  . LUNG BIOPSY  2015   small spot on lung  . TUBAL LIGATION    . UPPER GI ENDOSCOPY    . WRIST SURGERY Right 11/02/2019    Prior to Admission medications   Medication Sig Start Date End Date Taking? Authorizing Provider  cefdinir (OMNICEF) 300 MG capsule Take 1 capsule (300 mg total) by mouth 2 (two) times daily for 5 days. 11/29/20 12/04/20 Yes Naaman Plummer, MD  phenazopyridine (PYRIDIUM) 200 MG tablet Take 1 tablet (200 mg total) by mouth 3 (three) times daily as needed for pain. 11/29/20 11/29/21 Yes Naaman Plummer, MD  atorvastatin (LIPITOR) 80 MG tablet Take 80 mg by mouth at bedtime.     [provider]  busPIRone (BUSPAR) 10 MG tablet Take 20 mg by mouth 2 (two) times daily.    [facility]  busPIRone (BUSPAR) 30 MG tablet Take 30 mg by mouth 2 (two) times daily. 11/09/20   [provider]  citalopram (CELEXA) 20 MG tablet Take 20 mg by mouth daily. 01/16/20   [facility] [place]  dexlansoprazole (DEXILANT) 60 MG capsule Take 1 capsule (60 mg total) by mouth daily. 10/21/20   [name], MD  ezetimibe (ZETIA) 10 MG tablet Take 10 mg by mouth daily.  01/05/17   [provider]  fenofibrate micronized (LOFIBRA) 134 MG capsule Take 134 mg by mouth daily.    [provider]  gabapentin (NEURONTIN) 300 MG capsule Take 300 mg by mouth 2 (two) times daily.    [provider]  gabapentin (NEURONTIN) 600 MG tablet Take 600 mg by mouth daily. 06/15/20   [provider]  GLYXAMBI 25-5 MG TABS Take 1 tablet by mouth daily. 10/01/20   [provider]  insulin aspart (NOVOLOG FLEXPEN) 100 UNIT/ML FlexPen Inject 3 Units subcutaneously. Sliding scale.    [provider]  insulin  regular human CONCENTRATED (HUMULIN R) 500 UNIT/ML injection Inject 70 Units into the skin 3 (three) times daily with meals.    [provider]  ipratropium (ATROVENT) 0.03 % nasal spray Place 2 sprays into both nostrils every 12 (twelve) hours. 08/03/20   [name], PA-C  lamoTRIgine (LAMICTAL) 200 MG tablet Take 200 mg by mouth 2 (two) times daily.     [provider]  lisinopril (ZESTRIL) 2.5 MG tablet Take 1 tablet (2.5 mg total) by mouth daily. 11/04/20   Steele Sizer, MD  metoprolol tartrate (LOPRESSOR) 25 MG tablet Take 0.5 tablets (12.5 mg total) by mouth 2 (two) times daily. 06/08/20 09/06/20  Marrianne Mood D, PA-C  nitroGLYCERIN (NITROSTAT) 0.4 MG SL tablet Place 1 tablet (0.4 mg total) under the tongue every 5 (five) minutes as needed for chest pain. 10/13/20   Minna Merritts, MD  OneTouch Delica Lancets 62V MISC by Does not apply route.    [provider]  promethazine (PHENERGAN) 12.5 MG tablet Take 1 tablet (12.5 mg total) by mouth every 6 (six) hours as needed for nausea or vomiting. 10/21/20   [name], MD   QUEtiapine (SEROQUEL) 200 MG tablet Take 200 mg by mouth at bedtime. Along with 326m dose for a total of 502m   [provider]  SYNTHROID 100 MCG tablet Take 100 mcg by mouth daily. 10/01/20   [provider]  TRULICITY 1.5 MGOJ/5.0KXOPN SMARTSIG:0.5 Milliliter(s) SUB-Q Once a Week 10/01/20   [provider]    Allergies Mirtazapine, Morphine and related, Sulfamethoxazole-trimethoprim, Vicodin hp [hydrocodone-acetaminophen], Amoxicillin er, Codeine, Hydrocodone-acetaminophen, Keflex [cephalexin], and Prasterone  Family History  Problem Relation Age of Onset  . Diabetes Mother   . Heart disease Mother   . Hyperlipidemia Mother   . Hypertension Mother   . Kidney disease Mother   . Mental illness Mother   . Hypothyroidism Mother   . Stroke Mother   . Osteoporosis Mother   . Glaucoma Mother   . Congestive Heart Failure Mother   . Hypertension Father   . Asthma Daughter   . Cancer Daughter        throat  . Bipolar disorder Daughter   . Heart disease Maternal Grandfather   . Rheum arthritis Maternal Grandfather   . Cancer Maternal Grandfather        liver  . Kidney disease Maternal Grandfather   . Cancer Paternal Grandmother        lung  . Kidney disease Brother   . Breast cancer Maternal Grandmother 60  . Bipolar disorder Daughter   . Hypertension Daughter   . Restless legs syndrome Daughter     Social History Social History   Tobacco Use  . Smoking status: Never Smoker  . Smokeless tobacco: Never Used  Vaping Use  . Vaping Use: Never used  Substance Use Topics  . Alcohol use: No  . Drug use: No    Review of Systems Constitutional: No fever/chills Eyes: No visual changes. ENT: No sore throat. Cardiovascular: Denies chest pain. Respiratory: Denies shortness of breath. Gastrointestinal: No abdominal pain.  No nausea, no vomiting.  No diarrhea. Genitourinary: Positive for dysuria. Musculoskeletal: Negative for acute  arthralgias Skin: Negative for rash. Neurological: Negative for headaches, weakness/numbness/paresthesias in any extremity Psychiatric: Negative for suicidal ideation/homicidal ideation   ____________________________________________   PHYSICAL EXAM:  VITAL SIGNS: ED Triage Vitals  Enc Vitals Group     BP 11/29/20 1908 91/62     Pulse Rate 11/29/20 1908  60     Resp 11/29/20 1908 16     Temp 11/29/20 1914 97.9 F (36.6 C)     Temp Source 11/29/20 1914 Oral     SpO2 11/29/20 1908 95 %     Weight 11/29/20 1909 200 lb (90.7 kg)     Height 11/29/20 1909 4' 11"  (1.499 m)     Head Circumference --      Peak Flow --      Pain Score 11/29/20 1908 0     Pain Loc --      Pain Edu? --      Excl. in [place]? --    Constitutional: Alert and oriented. Well appearing obese middle-aged Caucasian female in no acute distress. Eyes: Conjunctivae are normal. PERRL. Head: Atraumatic. Nose: No congestion/rhinnorhea. Mouth/Throat: Mucous membranes are moist. Neck: No stridor Cardiovascular: Grossly normal heart sounds.  Good peripheral circulation. Respiratory: Normal respiratory effort.  No retractions. Gastrointestinal: Soft and nontender. No distention. Musculoskeletal: No obvious deformities Neurologic:  Normal speech and language. No gross focal neurologic deficits are appreciated. Skin:  Skin is warm and dry. No rash noted. Psychiatric: Mood and affect are normal. Speech and behavior are normal.  ____________________________________________   LABS (all labs ordered are listed, but only abnormal results are displayed)  Labs Reviewed  URINALYSIS, COMPLETE (UACMP) WITH MICROSCOPIC - Abnormal; Notable for the following components:      Result Value   Color, Urine AMBER (*)    APPearance CLEAR (*)    Glucose, UA >=500 (*)    Nitrite POSITIVE (*)    Bacteria, UA RARE (*)    All other components within normal limits  CBC WITH DIFFERENTIAL/PLATELET - Abnormal; Notable for the following  components:   Abs Immature Granulocytes 0.13 (*)    All other components within normal limits  COMPREHENSIVE METABOLIC PANEL  POC URINE PREG, ED  CBG MONITORING, ED  TROPONIN I (HIGH SENSITIVITY)  TROPONIN I (HIGH SENSITIVITY)   ____________________________________________  EKG  ED ECG REPORT I, Naaman Plummer, the attending physician, personally viewed and interpreted this ECG.  Date: 11/29/2020 EKG Time: 1913 Rate: 62 Rhythm: normal sinus rhythm QRS Axis: normal Intervals: normal ST/T Wave abnormalities: normal Narrative Interpretation: no evidence of acute ischemia  PROCEDURES  Procedure(s) performed (including Critical Care):  .1-3 Lead EKG Interpretation Performed by: Naaman Plummer, MD Authorized by: Naaman Plummer, MD     Interpretation: normal     ECG rate:  56   ECG rate assessment: normal     Rhythm: sinus rhythm     Ectopy: none     Conduction: normal       ____________________________________________   INITIAL IMPRESSION / ASSESSMENT AND PLAN / ED COURSE  As part of my medical decision making, I reviewed the following data within the [facility] notes reviewed and incorporated, Labs reviewed, EKG interpreted, Old chart reviewed, Radiograph reviewed and Notes from prior ED visits reviewed and incorporated        Not Pregnant. Unlikely TOA, Ovarian Torsion, PID, gonorrhea/chlamydia. Low suspicion for Infected Urolithiasis, AAA, Cholecystitis, Pancreatitis, SBO, Appendicitis, or other acute abdomen.  Rx: Cefdinir 300 mg BID for 5 days Disposition: Discharge home. SRP discussed. Advise follow up with primary care provider within 24-72 hours.      ____________________________________________   FINAL CLINICAL IMPRESSION(S) / ED DIAGNOSES  Final diagnoses:  Lightheadedness  Acute cystitis without hematuria     ED Discharge Orders  Ordered    cefdinir (OMNICEF) 300 MG capsule  2 times daily         11/29/20 2028    phenazopyridine (PYRIDIUM) 200 MG tablet  3 times daily PRN        11/29/20 2028           Note:  This document was prepared using Dragon voice recognition software and may include unintentional dictation errors.   Naaman Plummer, MD 11/29/20 2136

## 2020-11-29 NOTE — ED Notes (Signed)
Pt O2 saturation noted to be 89% on O2 monitors. Pt placed on 2 lpm via [place]. Pt O2 sats not maintaining >94%.

## 2020-11-29 NOTE — ED Triage Notes (Addendum)
Pt endorses burning and itching with urination x2 days and dizziness starting last night. Pt ambulatory at time of triage with a steady gait, no distress noted. Pt states "My husband could have just gotten some of my medications mixed up. I just want to lay down and go to sleep." Denies pain, fever, or other associated symptoms. Upon triage pt requesting "to be seen in the urgent care area here" because "it will be faster." Pt educated that she will be placed in an appropriate care area when a room is available to be evaluated by a doctor.

## 2020-11-29 NOTE — ED Provider Notes (Signed)
Patient was plan for discharge, as a prepare for discharge she became near syncopal became very lightheaded and weak.  Her blood pressure dropped into the 70s and she reports has been feeling this way now for a while and that is part of why she came to the ER she has been feeling very weak and fatigued anytime she gets up and is also having discomfort with urination for the last 2 days.  Initially plan was discharge, but I was asked to see and evaluate her.  She is fully awake and alert now.  She is seated in a chair reports she is starting to feel better, blood pressure has now normotensive.  She does appear somewhat dry by exam with dry mucous membranes.  I discussed with her further, and given the what appears to be a couple episodes of hypotension 1 on arrival and a significant event on preparing for discharge I does spike my concern for other etiologies and also concern for sepsis.  Patient does not feel comfortable with plan for discharge, and I think this is reasonable on her part.  Dr. Leory Plowman was of course not aware that this had happened, and I have taken over her care for her.  I will plan to admit her to the hospitalist service, EKG reviewed which appears nonischemic.  Additionally lab work to this point does appear reassuring.  Metabolic panel is pending first troponin normal.  Negative pregnancy test.  Suspicion for UTI started on Omnicef here.  Of note Omnicef was given after her episode of hypotension on discharge, thus I do not believe an acute allergic reaction.  She has no itching no wheezing and lungs are clear.  Vitals:   11/29/20 2206 11/29/20 2219  BP: (!) 78/53 126/69  Pulse:    Resp:    Temp:    SpO2:       Patient blood pressure now improving with fluid resuscitation.  Mental status improving.  Her labs have now resulted showing an elevated creatinine I suspect she may be dehydrated with associated UTI as evaluated by Dr. Cheri Fowler.  Will admit the patient to the hospitalist  service for further care and work-up at this point.  Condition improving   Delman Kitten, MD 11/29/20 2322

## 2020-11-29 NOTE — ED Provider Notes (Signed)
Patient accepted in admission by Dr. Normand Sloop.  Condition improving with fluids.   Delman Kitten, MD 11/29/20 [phone]

## 2020-11-30 ENCOUNTER — Ambulatory Visit: Payer: Medicare Other | Admitting: Family Medicine

## 2020-11-30 DIAGNOSIS — E1122 Type 2 diabetes mellitus with diabetic chronic kidney disease: Secondary | ICD-10-CM | POA: Diagnosis present

## 2020-11-30 DIAGNOSIS — N3000 Acute cystitis without hematuria: Secondary | ICD-10-CM | POA: Diagnosis not present

## 2020-11-30 DIAGNOSIS — R579 Shock, unspecified: Secondary | ICD-10-CM

## 2020-11-30 DIAGNOSIS — E039 Hypothyroidism, unspecified: Secondary | ICD-10-CM | POA: Diagnosis present

## 2020-11-30 DIAGNOSIS — E785 Hyperlipidemia, unspecified: Secondary | ICD-10-CM | POA: Diagnosis present

## 2020-11-30 DIAGNOSIS — I5032 Chronic diastolic (congestive) heart failure: Secondary | ICD-10-CM | POA: Diagnosis present

## 2020-11-30 DIAGNOSIS — R6521 Severe sepsis with septic shock: Secondary | ICD-10-CM | POA: Diagnosis present

## 2020-11-30 DIAGNOSIS — Z6841 Body Mass Index (BMI) 40.0 and over, adult: Secondary | ICD-10-CM | POA: Diagnosis not present

## 2020-11-30 DIAGNOSIS — I252 Old myocardial infarction: Secondary | ICD-10-CM | POA: Diagnosis not present

## 2020-11-30 DIAGNOSIS — Z79899 Other long term (current) drug therapy: Secondary | ICD-10-CM | POA: Diagnosis not present

## 2020-11-30 DIAGNOSIS — G2581 Restless legs syndrome: Secondary | ICD-10-CM | POA: Diagnosis present

## 2020-11-30 DIAGNOSIS — Z880 Allergy status to penicillin: Secondary | ICD-10-CM | POA: Diagnosis not present

## 2020-11-30 DIAGNOSIS — Z803 Family history of malignant neoplasm of breast: Secondary | ICD-10-CM | POA: Diagnosis not present

## 2020-11-30 DIAGNOSIS — F419 Anxiety disorder, unspecified: Secondary | ICD-10-CM | POA: Diagnosis present

## 2020-11-30 DIAGNOSIS — R55 Syncope and collapse: Secondary | ICD-10-CM | POA: Diagnosis not present

## 2020-11-30 DIAGNOSIS — I130 Hypertensive heart and chronic kidney disease with heart failure and stage 1 through stage 4 chronic kidney disease, or unspecified chronic kidney disease: Secondary | ICD-10-CM | POA: Diagnosis present

## 2020-11-30 DIAGNOSIS — F3130 Bipolar disorder, current episode depressed, mild or moderate severity, unspecified: Secondary | ICD-10-CM | POA: Diagnosis present

## 2020-11-30 DIAGNOSIS — A419 Sepsis, unspecified organism: Secondary | ICD-10-CM | POA: Diagnosis present

## 2020-11-30 DIAGNOSIS — E1151 Type 2 diabetes mellitus with diabetic peripheral angiopathy without gangrene: Secondary | ICD-10-CM | POA: Diagnosis present

## 2020-11-30 DIAGNOSIS — J449 Chronic obstructive pulmonary disease, unspecified: Secondary | ICD-10-CM | POA: Diagnosis present

## 2020-11-30 DIAGNOSIS — N1831 Chronic kidney disease, stage 3a: Secondary | ICD-10-CM | POA: Diagnosis present

## 2020-11-30 DIAGNOSIS — N390 Urinary tract infection, site not specified: Secondary | ICD-10-CM | POA: Diagnosis present

## 2020-11-30 DIAGNOSIS — G43909 Migraine, unspecified, not intractable, without status migrainosus: Secondary | ICD-10-CM | POA: Diagnosis present

## 2020-11-30 DIAGNOSIS — E1142 Type 2 diabetes mellitus with diabetic polyneuropathy: Secondary | ICD-10-CM | POA: Diagnosis present

## 2020-11-30 DIAGNOSIS — R42 Dizziness and giddiness: Secondary | ICD-10-CM | POA: Diagnosis present

## 2020-11-30 DIAGNOSIS — Z20822 Contact with and (suspected) exposure to covid-19: Secondary | ICD-10-CM | POA: Diagnosis present

## 2020-11-30 DIAGNOSIS — I2510 Atherosclerotic heart disease of native coronary artery without angina pectoris: Secondary | ICD-10-CM | POA: Diagnosis present

## 2020-11-30 LAB — URINE DRUG SCREEN, QUALITATIVE (ARMC ONLY)
Amphetamines, Ur Screen: NOT DETECTED
Barbiturates, Ur Screen: NOT DETECTED
Benzodiazepine, Ur Scrn: NOT DETECTED
Cannabinoid 50 Ng, Ur ~~LOC~~: NOT DETECTED
Cocaine Metabolite,Ur ~~LOC~~: NOT DETECTED
MDMA (Ecstasy)Ur Screen: NOT DETECTED
Methadone Scn, Ur: NOT DETECTED
Opiate, Ur Screen: NOT DETECTED
Phencyclidine (PCP) Ur S: NOT DETECTED
Tricyclic, Ur Screen: POSITIVE — AB

## 2020-11-30 LAB — CBG MONITORING, ED
Glucose-Capillary: 77 mg/dL (ref 70–99)
Glucose-Capillary: 40 mg/dL — CL (ref 70–99)
Glucose-Capillary: 42 mg/dL — CL (ref 70–99)
Glucose-Capillary: 150 mg/dL — ABNORMAL HIGH (ref 70–99)
Glucose-Capillary: 107 mg/dL — ABNORMAL HIGH (ref 70–99)
Glucose-Capillary: 103 mg/dL — ABNORMAL HIGH (ref 70–99)

## 2020-11-30 LAB — CBC WITH DIFFERENTIAL/PLATELET
Abs Immature Granulocytes: 0.13 10*3/uL — ABNORMAL HIGH (ref 0.00–0.07)
Basophils Absolute: 0.1 10*3/uL (ref 0.0–0.1)
Basophils Relative: 1 %
Eosinophils Absolute: 0.1 10*3/uL (ref 0.0–0.5)
Eosinophils Relative: 1 %
HCT: 35.1 % — ABNORMAL LOW (ref 36.0–46.0)
Hemoglobin: 11.3 g/dL — ABNORMAL LOW (ref 12.0–15.0)
Immature Granulocytes: 2 %
Lymphocytes Relative: 29 %
Lymphs Abs: 2.4 10*3/uL (ref 0.7–4.0)
MCH: 30.4 pg (ref 26.0–34.0)
MCHC: 32.2 g/dL (ref 30.0–36.0)
MCV: 94.4 fL (ref 80.0–100.0)
Monocytes Absolute: 0.7 10*3/uL (ref 0.1–1.0)
Monocytes Relative: 8 %
Neutro Abs: 5 10*3/uL (ref 1.7–7.7)
Neutrophils Relative %: 59 %
Platelets: 232 10*3/uL (ref 150–400)
RBC: 3.72 MIL/uL — ABNORMAL LOW (ref 3.87–5.11)
RDW: 13.3 % (ref 11.5–15.5)
WBC: 8.4 10*3/uL (ref 4.0–10.5)
nRBC: 0 % (ref 0.0–0.2)

## 2020-11-30 LAB — PROCALCITONIN: Procalcitonin: <0.1 ng/mL

## 2020-11-30 LAB — LACTIC ACID, PLASMA
Lactic Acid, Venous: 1.1 mmol/L (ref 0.5–1.9)
Lactic Acid, Venous: 1.2 mmol/L (ref 0.5–1.9)

## 2020-11-30 LAB — PROTIME-INR
INR: 1.1 (ref 0.8–1.2)
Prothrombin Time: 13.9 seconds (ref 11.4–15.2)

## 2020-11-30 LAB — GLUCOSE, CAPILLARY
Glucose-Capillary: 189 mg/dL — ABNORMAL HIGH (ref 70–99)
Glucose-Capillary: 194 mg/dL — ABNORMAL HIGH (ref 70–99)
Glucose-Capillary: 218 mg/dL — ABNORMAL HIGH (ref 70–99)
Glucose-Capillary: 228 mg/dL — ABNORMAL HIGH (ref 70–99)
Glucose-Capillary: 143 mg/dL — ABNORMAL HIGH (ref 70–99)

## 2020-11-30 LAB — COMPREHENSIVE METABOLIC PANEL
ALT: 23 U/L (ref 0–44)
AST: 23 U/L (ref 15–41)
Albumin: 3.3 g/dL — ABNORMAL LOW (ref 3.5–5.0)
Alkaline Phosphatase: 40 U/L (ref 38–126)
Anion gap: 8 (ref 5–15)
BUN: 27 mg/dL — ABNORMAL HIGH (ref 6–20)
CO2: 24 mmol/L (ref 22–32)
Calcium: 8.9 mg/dL (ref 8.9–10.3)
Chloride: 106 mmol/L (ref 98–111)
Creatinine, Ser: 1.31 mg/dL — ABNORMAL HIGH (ref 0.44–1.00)
GFR, Estimated: 48 mL/min — ABNORMAL LOW (ref >60)
Glucose, Bld: 156 mg/dL — ABNORMAL HIGH (ref 70–99)
Potassium: 4 mmol/L (ref 3.5–5.1)
Sodium: 138 mmol/L (ref 135–145)
Total Bilirubin: 0.5 mg/dL (ref 0.3–1.2)
Total Protein: 5.6 g/dL — ABNORMAL LOW (ref 6.5–8.1)

## 2020-11-30 LAB — RESP PANEL BY RT-PCR (FLU A&B, COVID) ARPGX2
Influenza A by PCR: NEGATIVE
Influenza B by PCR: NEGATIVE
SARS Coronavirus 2 by RT PCR: NEGATIVE

## 2020-11-30 LAB — CORTISOL: Cortisol, Plasma: 8.2 ug/dL

## 2020-11-30 LAB — MRSA PCR SCREENING: MRSA by PCR: NEGATIVE

## 2020-11-30 LAB — APTT: aPTT: 26 seconds (ref 24–36)

## 2020-11-30 LAB — D-DIMER, QUANTITATIVE: D-Dimer, Quant: 0.35 ug/mL-FEU (ref 0.00–0.50)

## 2020-11-30 LAB — HIV ANTIBODY (ROUTINE TESTING W REFLEX): HIV Screen 4th Generation wRfx: NONREACTIVE

## 2020-11-30 MED ORDER — SODIUM CHLORIDE 0.9 % IV SOLN
2.0000 g | Freq: Once | INTRAVENOUS | Status: DC
Start: 2020-11-30 — End: 2020-11-30

## 2020-11-30 MED ORDER — SODIUM CHLORIDE 0.9 % IV BOLUS
1000.0000 mL | Freq: Once | INTRAVENOUS | Status: AC
  Administered 2020-11-30: 1000 mL via INTRAVENOUS

## 2020-11-30 MED ORDER — TRAZODONE HCL 50 MG PO TABS
25.0000 mg | ORAL_TABLET | Freq: Every evening | ORAL | Status: DC | PRN
Start: 2020-11-30 — End: 2020-12-03
  Administered 2020-11-30 – 2020-12-02 (×3): 25 mg via ORAL
  Filled 2020-11-30 (×3): qty 1

## 2020-11-30 MED ORDER — MIDODRINE HCL 5 MG PO TABS
10.0000 mg | ORAL_TABLET | Freq: Three times a day (TID) | ORAL | Status: DC
  Administered 2020-11-30 – 2020-12-02 (×7): 10 mg via ORAL
  Filled 2020-11-30 (×9): qty 2

## 2020-11-30 MED ORDER — QUETIAPINE FUMARATE 25 MG PO TABS
200.0000 mg | ORAL_TABLET | Freq: Every day | ORAL | Status: DC
  Administered 2020-11-30 – 2020-12-02 (×3): 200 mg via ORAL
  Filled 2020-11-30 (×3): qty 8

## 2020-11-30 MED ORDER — GABAPENTIN 600 MG PO TABS
600.0000 mg | ORAL_TABLET | Freq: Every day | ORAL | Status: DC
  Administered 2020-11-30 – 2020-12-02 (×3): 600 mg via ORAL
  Filled 2020-11-30 (×4): qty 1

## 2020-11-30 MED ORDER — FENOFIBRATE 160 MG PO TABS
160.0000 mg | ORAL_TABLET | Freq: Every day | ORAL | Status: DC
  Administered 2020-11-30 – 2020-12-03 (×4): 160 mg via ORAL
  Filled 2020-11-30 (×5): qty 1

## 2020-11-30 MED ORDER — ACETAMINOPHEN 650 MG RE SUPP: 650.0000 mg | Freq: Four times a day (QID) | RECTAL | Status: DC | PRN

## 2020-11-30 MED ORDER — GABAPENTIN 300 MG PO CAPS
300.0000 mg | ORAL_CAPSULE | Freq: Two times a day (BID) | ORAL | Status: DC
  Administered 2020-11-30 – 2020-12-03 (×7): 300 mg via ORAL
  Filled 2020-11-30 (×7): qty 1

## 2020-11-30 MED ORDER — ATORVASTATIN CALCIUM 80 MG PO TABS
80.0000 mg | ORAL_TABLET | Freq: Every day | ORAL | Status: DC
  Administered 2020-11-30 – 2020-12-02 (×3): 80 mg via ORAL
  Filled 2020-11-30: qty 1
  Filled 2020-11-30: qty 4
  Filled 2020-11-30: qty 1

## 2020-11-30 MED ORDER — ACETAMINOPHEN 325 MG PO TABS
650.0000 mg | ORAL_TABLET | Freq: Four times a day (QID) | ORAL | Status: DC | PRN
  Administered 2020-12-01 – 2020-12-03 (×5): 650 mg via ORAL
  Filled 2020-11-30 (×5): qty 2

## 2020-11-30 MED ORDER — NOREPINEPHRINE 4 MG/250ML-% IV SOLN
2.0000 ug/min | INTRAVENOUS | Status: DC
  Administered 2020-11-30 (×2): 2 ug/min via INTRAVENOUS
  Administered 2020-12-01: 5 ug/min via INTRAVENOUS
  Filled 2020-11-30 (×3): qty 250

## 2020-11-30 MED ORDER — ONDANSETRON HCL 4 MG PO TABS
4.0000 mg | ORAL_TABLET | Freq: Four times a day (QID) | ORAL | Status: DC | PRN
  Filled 2020-11-30: qty 1

## 2020-11-30 MED ORDER — SODIUM CHLORIDE 0.9 % IV SOLN
2.0000 g | Freq: Two times a day (BID) | INTRAVENOUS | Status: DC
  Filled 2020-11-30: qty 2

## 2020-11-30 MED ORDER — SODIUM CHLORIDE 0.9 % IV SOLN: INTRAVENOUS | Status: DC

## 2020-11-30 MED ORDER — SODIUM CHLORIDE 0.9 % IV SOLN
250.0000 mL | INTRAVENOUS | Status: DC
  Administered 2020-11-30: 250 mL via INTRAVENOUS

## 2020-11-30 MED ORDER — CHLORHEXIDINE GLUCONATE CLOTH 2 % EX PADS
6.0000 | MEDICATED_PAD | Freq: Every day | CUTANEOUS | Status: DC
  Administered 2020-11-30 – 2020-12-01 (×2): 6 via TOPICAL

## 2020-11-30 MED ORDER — PANTOPRAZOLE SODIUM 40 MG PO TBEC
40.0000 mg | DELAYED_RELEASE_TABLET | Freq: Every day | ORAL | Status: DC
  Administered 2020-11-30 – 2020-12-03 (×4): 40 mg via ORAL
  Filled 2020-11-30 (×4): qty 1

## 2020-11-30 MED ORDER — INSULIN REGULAR HUMAN (CONC) 500 UNIT/ML ~~LOC~~ SOLN
70.0000 [IU] | Freq: Three times a day (TID) | SUBCUTANEOUS | Status: DC
  Filled 2020-11-30: qty 20

## 2020-11-30 MED ORDER — MAGNESIUM HYDROXIDE 400 MG/5ML PO SUSP: 30.0000 mL | Freq: Every day | ORAL | Status: DC | PRN

## 2020-11-30 MED ORDER — SODIUM CHLORIDE 0.9 % IV SOLN
2.0000 g | Freq: Two times a day (BID) | INTRAVENOUS | Status: DC
  Administered 2020-11-30 – 2020-12-02 (×5): 2 g via INTRAVENOUS
  Filled 2020-11-30 (×5): qty 2

## 2020-11-30 MED ORDER — BUSPIRONE HCL 5 MG PO TABS: 20.0000 mg | ORAL_TABLET | Freq: Two times a day (BID) | ORAL | Status: DC

## 2020-11-30 MED ORDER — INSULIN ASPART 100 UNIT/ML ~~LOC~~ SOLN
0.0000 [IU] | Freq: Every day | SUBCUTANEOUS | Status: DC
  Administered 2020-11-30: 2 [IU] via SUBCUTANEOUS
  Filled 2020-11-30 (×2): qty 1

## 2020-11-30 MED ORDER — PROMETHAZINE HCL 25 MG PO TABS
12.5000 mg | ORAL_TABLET | Freq: Four times a day (QID) | ORAL | Status: DC | PRN
  Administered 2020-11-30: 12.5 mg via ORAL
  Filled 2020-11-30 (×3): qty 1

## 2020-11-30 MED ORDER — DEXTROSE 50 % IV SOLN
INTRAVENOUS | Status: AC
  Administered 2020-11-30: 1 via INTRAVENOUS
  Filled 2020-11-30: qty 50

## 2020-11-30 MED ORDER — DEXTROSE 50 % IV SOLN: 1.0000 | Freq: Once | INTRAVENOUS | Status: AC

## 2020-11-30 MED ORDER — EZETIMIBE 10 MG PO TABS
10.0000 mg | ORAL_TABLET | Freq: Every day | ORAL | Status: DC
  Administered 2020-11-30 – 2020-12-03 (×4): 10 mg via ORAL
  Filled 2020-11-30 (×5): qty 1

## 2020-11-30 MED ORDER — METOPROLOL TARTRATE 25 MG PO TABS: 12.5000 mg | ORAL_TABLET | Freq: Two times a day (BID) | ORAL | Status: DC

## 2020-11-30 MED ORDER — IPRATROPIUM BROMIDE 0.03 % NA SOLN: 2.0000 | Freq: Two times a day (BID) | NASAL | Status: DC

## 2020-11-30 MED ORDER — INSULIN ASPART 100 UNIT/ML ~~LOC~~ SOLN
0.0000 [IU] | Freq: Three times a day (TID) | SUBCUTANEOUS | Status: DC
  Administered 2020-12-01 – 2020-12-02 (×4): 3 [IU] via SUBCUTANEOUS
  Administered 2020-12-02: 2 [IU] via SUBCUTANEOUS
  Administered 2020-12-03: 8 [IU] via SUBCUTANEOUS
  Administered 2020-12-03: 2 [IU] via SUBCUTANEOUS
  Filled 2020-11-30 (×7): qty 1

## 2020-11-30 MED ORDER — ONDANSETRON HCL 4 MG/2ML IJ SOLN
4.0000 mg | Freq: Four times a day (QID) | INTRAMUSCULAR | Status: DC | PRN
  Administered 2020-12-01 – 2020-12-02 (×3): 4 mg via INTRAVENOUS
  Filled 2020-11-30 (×3): qty 2

## 2020-11-30 MED ORDER — CITALOPRAM HYDROBROMIDE 20 MG PO TABS
20.0000 mg | ORAL_TABLET | Freq: Every day | ORAL | Status: DC
  Administered 2020-11-30 – 2020-12-03 (×4): 20 mg via ORAL
  Filled 2020-11-30 (×3): qty 1

## 2020-11-30 MED ORDER — SODIUM CHLORIDE 0.9 % IV SOLN
1.0000 g | Freq: Once | INTRAVENOUS | Status: AC
  Administered 2020-11-30: 1 g via INTRAVENOUS
  Filled 2020-11-30: qty 1

## 2020-11-30 MED ORDER — BUSPIRONE HCL 15 MG PO TABS
30.0000 mg | ORAL_TABLET | Freq: Two times a day (BID) | ORAL | Status: DC
  Administered 2020-11-30 – 2020-12-03 (×7): 30 mg via ORAL
  Filled 2020-11-30 (×8): qty 2

## 2020-11-30 MED ORDER — LAMOTRIGINE 100 MG PO TABS
200.0000 mg | ORAL_TABLET | Freq: Two times a day (BID) | ORAL | Status: DC
  Administered 2020-11-30 – 2020-12-03 (×7): 200 mg via ORAL
  Filled 2020-11-30: qty 2
  Filled 2020-11-30 (×2): qty 8
  Filled 2020-11-30: qty 2
  Filled 2020-11-30: qty 8
  Filled 2020-11-30 (×2): qty 2

## 2020-11-30 MED ORDER — ENOXAPARIN SODIUM 60 MG/0.6ML ~~LOC~~ SOLN
45.0000 mg | SUBCUTANEOUS | Status: DC
  Administered 2020-11-30 – 2020-12-03 (×4): 45 mg via SUBCUTANEOUS
  Filled 2020-11-30 (×4): qty 0.6

## 2020-11-30 MED ORDER — NITROGLYCERIN 0.4 MG SL SUBL: 0.4000 mg | SUBLINGUAL_TABLET | SUBLINGUAL | Status: DC | PRN

## 2020-11-30 MED ORDER — LEVOTHYROXINE SODIUM 100 MCG PO TABS
100.0000 ug | ORAL_TABLET | Freq: Every day | ORAL | Status: DC
  Administered 2020-11-30 – 2020-12-01 (×2): 100 ug via ORAL
  Filled 2020-11-30: qty 2
  Filled 2020-11-30: qty 1

## 2020-11-30 NOTE — ED Provider Notes (Addendum)
Vitals:   11/29/20 2300 11/29/20 2330  BP: (!) 67/30 (!) 73/47  Pulse: 61 (!) 58  Resp:    Temp:    SpO2: 90% 97%     Reevaluated the patient, her blood pressure has once again dropped and she is hypotensive.  She is alert and mentating, but appears somewhat pale slow perfusion peripherally.  She does appear hypotensive.  Admitting Dr. Normand Sloop at the bedside as well.  At this point additional IV access has been obtained, 2 L normal saline wide open infusing.  I have also ordered additional peripheral pressor.  I discussed risks and benefits of peripheral pressors including risks of extravasation and skin/limb damage associated with possible peripheral pressors with the patient and she is understanding of this risk.  Our current goal is to fluid resuscitate her, provide brief peripheral pressors and hopefully be able to wean off.  Dr. Normand Sloop of the hospitalist service continues to wish to admit to his service and plan to continue to follow closely as we continue early resucitation.    Delman Kitten, MD 11/30/20 0017  CRITICAL CARE Performed by: Delman Kitten   Total critical care time: 30 minutes  Critical care time was exclusive of separately billable procedures and treating other patients.  Critical care was necessary to treat or prevent imminent or life-threatening deterioration.  Critical care was time spent personally by me on the following activities: development of treatment plan with patient and/or surrogate as well as nursing, discussions with consultants, evaluation of patient's response to treatment, examination of patient, obtaining history from patient or surrogate, ordering and performing treatments and interventions, ordering and review of laboratory studies, ordering and review of radiographic studies, pulse oximetry and re-evaluation of patient's condition.     Delman Kitten, MD 11/30/20 [phone]

## 2020-11-30 NOTE — Progress Notes (Signed)
Pharmacy Antibiotic Note  [name] is a 55 y.o. female admitted on 11/29/2020 with sepsis.  Pharmacy has been consulted for Cefepime dosing. CrCl = 40.9 ml/min   Pt has cephalexin listed as allergy with rash as rxn, pt does not remember rash or airway involvement.  Will continue with Cefepime.   Plan: Cefepime 2 gm IV Q12H ordered to start on 3/29 @ 0800.   Height: 4' 11"  (149.9 cm) Weight: 90.7 kg (200 lb) IBW/kg (Calculated) : 43.2  Temp (24hrs), Avg:97.9 F (36.6 C), Min:97.9 F (36.6 C), Max:97.9 F (36.6 C)  Recent Labs  Lab 11/29/20 1921 11/29/20 2225  WBC 9.8  --   CREATININE  --  1.49*  LATICACIDVEN  --  1.9    Estimated Creatinine Clearance: 40.9 mL/min (A) (by C-G formula based on SCr of 1.49 mg/dL (H)).    Allergies  Allergen Reactions  . Mirtazapine Anaphylaxis    REACTION: closes throat  . Morphine And Related     anaphylaxis  . Sulfamethoxazole-Trimethoprim Rash    REACTION: Whelps all over  . Vicodin Hp [Hydrocodone-Acetaminophen]   . Amoxicillin Er Nausea And Vomiting    Pt doesn't remember problem with amoxicillin -  SE Has patient had a PCN reaction causing immediate rash, facial/tongue/throat swelling, SOB or lightheadedness with hypotension: No Has patient had a PCN reaction causing severe rash involving mucus membranes or skin necrosis: No Has patient had a PCN reaction that required hospitalization: No Has patient had a PCN reaction occurring within the last 10 years: Yes If all of the above answers are "NO", then may proceed with Cephalosporin use.  . Codeine Itching    REACTION: itch, rash  . Hydrocodone-Acetaminophen Rash    REACTION: rash  . Keflex [Cephalexin] Rash    Pt does not remember having a reaction or rash, no airway involvement  . Prasterone Nausea And Vomiting    Antimicrobials this admission:   >>    >>   Dose adjustments this admission:   Microbiology results:  BCx:   UCx:    Sputum:    MRSA PCR:   Thank  you for allowing pharmacy to be a part of this patient's care.  Markesia Crilly D 11/30/2020 2:33 AM

## 2020-11-30 NOTE — ED Notes (Addendum)
lab contacted to add on thyroid panel to blood already previously collected

## 2020-11-30 NOTE — Progress Notes (Signed)
PHARMACY -  BRIEF ANTIBIOTIC NOTE   Pharmacy has received consult(s) for meropenem from an ED provider.  [name] profile has been reviewed for ht/wt/allergies/indication/available labs.    One time order(s) placed for Meropenem 1 gm IV X 1.   Further antibiotics/pharmacy consults should be ordered by admitting physician if indicated.                       Thank you, Sila Sarsfield D 11/30/2020  2:28 AM

## 2020-11-30 NOTE — ED Notes (Signed)
Medications due at this time not yet verified by pharmacy

## 2020-11-30 NOTE — ED Notes (Signed)
cbg checked at this time due to pt being very drowsy and previous drop in cbg. cbg wnl at this time

## 2020-11-30 NOTE — ED Notes (Signed)
Lab contacted to collect all ordered blood work at this time

## 2020-11-30 NOTE — Progress Notes (Signed)
Elink following for Sepsis Protocol 

## 2020-11-30 NOTE — ED Notes (Addendum)
Pt CBG was 42. This RN gave pt orange juice and rechecked pt CBG and is was 40. This RN notified Mansy, MD and pt was given D50 push

## 2020-11-30 NOTE — ED Notes (Signed)
Pharmacy contacted by this RN to verify orders for admin

## 2020-11-30 NOTE — Consult Note (Signed)
NAME:  [name], MRN:  [id number], DOB:  1966-02-05, LOS: 0 ADMISSION DATE:  11/29/2020, CONSULTATION DATE: 11/30/2020 REFERRING MD: Dr. Sidney Ace, CHIEF COMPLAINT: syncope & dsyuria   History of Present Illness:  55 year old female arrived at the ED via EMS from home with complaints of dizziness upon standing as well as polyuria & dysuria.  ED course: Initial VSS, patient diagnosed with UTI with a plan of discharge. However at the time of discharge the patient   Pertinent  Medical History  Obstructive sleep apnea-chronic 2 L nasal cannula at night Type 2 diabetes uncontrolled with neuropathy Peripheral vascular disease Morbid obesity Hypothyroidism Hypertension Hyperlipidemia MI -2009 Chronic obstructive pulmonary disease Congestive heart failure CKD stage III Bipolar depression  Significant Hospital Events: Including procedures, antibiotic start and stop dates in addition to other pertinent events   . 11/30/20- Admit to ICU due to pre-syncopal event with associated hypotension requiring vasopressors  Interim History / Subjective:  Patient drowsy upon waking but then alert & oriented. She describes these episodes as feeling her legs going out from under her, with associated nausea and light-headedness. She denies CP, SOB, blurred vision, palpitations or tremors. No current c/o pain or nausea but does endorse recent poor PO intake.  Patient feels much better Weaning off pressors at this time  Objective   Blood pressure (!) 81/47, pulse (!) 52, temperature 97.9 F (36.6 C), temperature source Oral, resp. rate 20, height 4' 11"  (1.499 m), weight 90.7 kg, SpO2 99 %.        Intake/Output Summary (Last 24 hours) at 11/30/2020 0603 Last data filed at 11/30/2020 0535 Gross per 24 hour  Intake 1804.3 ml  Output --  Net 1804.3 ml   Filed Weights   11/29/20 1909  Weight: 90.7 kg    Examination: General: Adult female, critically ill, lying in bed, NAD HEENT: MM pink/moist,  anicteric, atraumatic, neck supple Neuro: A&O x 4, able to follow commands, PERRL +4, MAE CV: s1s2 RRR, NSR-SB on monitor, no r/m/g Pulm: Regular, non labored on 2 L [place], breath sounds clear-BUL & diminished-BLL GI: soft, rounded, non tender, bs x 4 GU: purewick in place with clear orange-tinted urine Skin: limited exam- no rashes/lesions noted Extremities: warm/dry, pulses + 2 R/P, no edema noted  Labs/imaging that I havepersonally reviewed  (right click and "Reselect all SmartList Selections" daily)  EKG Interpretation Date: 11/29/20 EKG Time: 19:13 Rate: 62 Rhythm: NSR QRS Axis:  possible LAD Intervals: normal ST/T Wave abnormalities: non specific T wave abnormalities Narrative Interpretation: NSR with non specific T wave abnormalities CBC    Component Value Date/Time   WBC 8.4 11/30/2020 0328   RBC 3.72 (L) 11/30/2020 0328   HGB 11.3 (L) 11/30/2020 0328   HGB 11.2 09/22/2015 1339   HCT 35.1 (L) 11/30/2020 0328   HCT 34.8 09/22/2015 1339   PLT 232 11/30/2020 0328   PLT 337 09/22/2015 1339   MCV 94.4 11/30/2020 0328   MCV 90 09/22/2015 1339   MCV 87 12/21/2014 1234   MCH 30.4 11/30/2020 0328   MCHC 32.2 11/30/2020 0328   RDW 13.3 11/30/2020 0328   RDW 14.7 09/22/2015 1339   RDW 14.3 12/21/2014 1234   LYMPHSABS 2.4 11/30/2020 0328   LYMPHSABS 3.1 09/22/2015 1339   LYMPHSABS 2.6 12/21/2014 1234   MONOABS 0.7 11/30/2020 0328   MONOABS 0.4 12/21/2014 1234   EOSABS 0.1 11/30/2020 0328   EOSABS 0.2 09/22/2015 1339   EOSABS 0.2 12/21/2014 1234   BASOSABS 0.1 11/30/2020 0328  BASOSABS 0.1 09/22/2015 1339   BASOSABS 0.0 12/21/2014 1234   BMP Latest Ref Rng & Units 11/30/2020 11/29/2020 10/26/2020  Glucose 70 - 99 mg/dL 156(H) 87 -  BUN 6 - 20 mg/dL 27(H) 28(H) 29(A)  Creatinine 0.44 - 1.00 mg/dL 1.31(H) 1.49(H) 1.3(A)  BUN/Creat Ratio 6 - 22 (calc) - - -  Sodium 135 - 145 mmol/L 138 139 139  Potassium 3.5 - 5.1 mmol/L 4.0 4.2 4.9  Chloride 98 - 111 mmol/L 106 104 102   CO2 22 - 32 mmol/L 24 26 -  Calcium 8.9 - 10.3 mg/dL 8.9 9.8 -    Resolved Hospital Problem list     Assessment & Plan:  Sepsis with septic shock due to suspected UTI continue IVF Patient feels better Weaning off pressors  ACUTE KIDNEY INJURY/Renal Failure -continue Foley Catheter-assess need -Avoid nephrotoxic agents -Follow urine output, BMP -Ensure adequate renal perfusion, optimize oxygenation -Renal dose medications   Hypoglycemia Stop insulin for now   Labs   CBC: Recent Labs  Lab 11/29/20 1921 11/30/20 0328  WBC 9.8 8.4  NEUTROABS 4.8 5.0  HGB 12.8 11.3*  HCT 41.7 35.1*  MCV 98.8 94.4  PLT 315 229    Basic Metabolic Panel: Recent Labs  Lab 11/29/20 2225 11/30/20 0328  NA 139 138  K 4.2 4.0  CL 104 106  CO2 26 24  GLUCOSE 87 156*  BUN 28* 27*  CREATININE 1.49* 1.31*  CALCIUM 9.8 8.9   GFR: Estimated Creatinine Clearance: 46.5 mL/min (A) (by C-G formula based on SCr of 1.31 mg/dL (H)). Recent Labs  Lab 11/29/20 1921 11/29/20 2225 11/30/20 0328  PROCALCITON  --   --  <0.10  WBC 9.8  --  8.4  LATICACIDVEN  --  1.9 1.1    Liver Function Tests: Recent Labs  Lab 11/29/20 2225 11/30/20 0328  AST 29 23  ALT 26 23  ALKPHOS 43 40  BILITOT 0.5 0.5  PROT 6.4* 5.6*  ALBUMIN 3.8 3.3*   No results for input(s): LIPASE, AMYLASE in the last 168 hours. No results for input(s): AMMONIA in the last 168 hours.  ABG    Component Value Date/Time   HCO3 19.2 (L) 12/04/2017 1558   TCO2 25 05/18/2020 0958   ACIDBASEDEF 4.5 (H) 12/04/2017 1558   O2SAT 92.4 12/04/2017 1558     Coagulation Profile: Recent Labs  Lab 11/30/20 0328  INR 1.1    Cardiac Enzymes: No results for input(s): CKTOTAL, CKMB, CKMBINDEX, TROPONINI in the last 168 hours.  HbA1C: Hemoglobin A1C  Date/Time Value Ref Range Status  10/26/2020 12:00 AM 8.7  Final  06/12/2020 12:00 AM 9.6  Final    CBG: Recent Labs  Lab 11/29/20 1918 11/30/20 0133 11/30/20 0155  11/30/20 0243  GLUCAP 95 42* 40* 150*    Past Medical History:  She,  has a past medical history of Anxiety, Arthritis, Asthma, Bipolar depression ([place]), CHF (congestive heart failure) ([place]), Chronic headaches, Chronic kidney disease, COPD (chronic obstructive pulmonary disease) ([place]), Coronary artery disease, Depression, Diabetic neuropathy ([place]), Dyspnea, Dysrhythmia, Fatty liver, GERD (gastroesophageal reflux disease), Headache, Heart attack ([place]) (2009), Hyperlipidemia, Hypertension, Hypomagnesemia, Hypothyroid, Irregular heart rhythm, Liver lesion, Morbid obesity ([place]), OSA (obstructive sleep apnea), Peripheral vascular disease ([place]), Pinched nerve, Pneumonia, RLS (restless legs syndrome), Seasonal allergies, Sleep apnea, Type 2 diabetes, uncontrolled, with neuropathy ([place]) (10/15/2015), Urinary incontinence, and Vitamin D deficiency.   Surgical History:   Past Surgical History:  Procedure Laterality Date  . ABDOMINAL HYSTERECTOMY  Partial  . bladder stim  2007  . BLADDER SURGERY     x 2. tacking of bladder and the other was the stimulator placement  . CHOLECYSTECTOMY    . COLONOSCOPY WITH PROPOFOL N/A 03/04/2019   Procedure: COLONOSCOPY WITH PROPOFOL;  Surgeon: [name], MD;  Location: [facility] ENDOSCOPY;  Service: Endoscopy;  Laterality: N/A;  . ESOPHAGOGASTRODUODENOSCOPY (EGD) WITH PROPOFOL N/A 03/04/2019   Procedure: ESOPHAGOGASTRODUODENOSCOPY (EGD) WITH PROPOFOL;  Surgeon: [name], MD;  Location: [facility] ENDOSCOPY;  Service: Endoscopy;  Laterality: N/A;  . INCISION AND DRAINAGE ABSCESS Right 04/16/2019   Procedure: INCISION AND DRAINAGE ABSCESS;  Surgeon: Benjamine Sprague, DO;  Location: [facility] ORS;  Service: General;  Laterality: Right;  . INTERSTIM-GENERATOR CHANGE N/A 12/31/2017   Procedure: INTERSTIM-GENERATOR CHANGE;  Surgeon: Bjorn Loser, MD;  Location: [facility] ORS;  Service: Urology;  Laterality: N/A;  . LIVER BIOPSY  March 2016   Negative  . LUNG BIOPSY  2015   small spot  on lung  . TUBAL LIGATION    . UPPER GI ENDOSCOPY    . WRIST SURGERY Right 11/02/2019     Social History:   reports that she has never smoked. She has never used smokeless tobacco. She reports that she does not drink alcohol and does not use drugs.   Family History:  Her family history includes Asthma in her daughter; Bipolar disorder in her daughter and daughter; Breast cancer (age of onset: 52) in her maternal grandmother; Cancer in her daughter, maternal grandfather, and paternal grandmother; Congestive Heart Failure in her mother; Diabetes in her mother; Glaucoma in her mother; Heart disease in her maternal grandfather and mother; Hyperlipidemia in her mother; Hypertension in her daughter, father, and mother; Hypothyroidism in her mother; Kidney disease in her brother, maternal grandfather, and mother; Mental illness in her mother; Osteoporosis in her mother; Restless legs syndrome in her daughter; Rheum arthritis in her maternal grandfather; Stroke in her mother.   Allergies Allergies  Allergen Reactions  . Mirtazapine Anaphylaxis    REACTION: closes throat  . Morphine And Related     anaphylaxis  . Sulfamethoxazole-Trimethoprim Rash    REACTION: [place] all over  . Vicodin Hp [Hydrocodone-Acetaminophen]   . Amoxicillin Er Nausea And Vomiting    Pt doesn't remember problem with amoxicillin -  SE Has patient had a PCN reaction causing immediate rash, facial/tongue/throat swelling, SOB or lightheadedness with hypotension: No Has patient had a PCN reaction causing severe rash involving mucus membranes or skin necrosis: No Has patient had a PCN reaction that required hospitalization: No Has patient had a PCN reaction occurring within the last 10 years: Yes If all of the above answers are "NO", then may proceed with Cephalosporin use.  . Codeine Itching    REACTION: itch, rash  . Hydrocodone-Acetaminophen Rash    REACTION: rash  . Keflex [Cephalexin] Rash    Pt does not remember  having a reaction or rash, no airway involvement  . Prasterone Nausea And Vomiting     Home Medications  Prior to Admission medications   Medication Sig Start Date End Date Taking? Authorizing Provider  atorvastatin (LIPITOR) 80 MG tablet Take 80 mg by mouth at bedtime.    Yes [provider]  busPIRone (BUSPAR) 30 MG tablet Take 30 mg by mouth 2 (two) times daily. 11/09/20  Yes [provider]  cefdinir (OMNICEF) 300 MG capsule Take 1 capsule (300 mg total) by mouth 2 (two) times daily for 5 days. 11/29/20 12/04/20 Yes Bradler, Vista Lawman,  MD  citalopram (CELEXA) 20 MG tablet Take 20 mg by mouth daily. 01/16/20  Yes Care, [facility]  dexlansoprazole (DEXILANT) 60 MG capsule Take 1 capsule (60 mg total) by mouth daily. 10/21/20  Yes [name], MD  ezetimibe (ZETIA) 10 MG tablet Take 10 mg by mouth at bedtime. 01/05/17  Yes [provider]  fenofibrate micronized (LOFIBRA) 134 MG capsule Take 134 mg by mouth at bedtime.   Yes [provider]  gabapentin (NEURONTIN) 300 MG capsule Take 300 mg by mouth 2 (two) times daily.   Yes [provider]  gabapentin (NEURONTIN) 600 MG tablet Take 600 mg by mouth at bedtime. 06/15/20  Yes [provider]  GLYXAMBI 25-5 MG TABS Take 1 tablet by mouth daily. 10/01/20  Yes [provider]  insulin aspart (NOVOLOG FLEXPEN) 100 UNIT/ML FlexPen Inject 3 Units subcutaneously. Sliding scale.   Yes [provider]  insulin regular human CONCENTRATED (HUMULIN R) 500 UNIT/ML injection Inject 70 Units into the skin 3 (three) times daily with meals.   Yes [provider]  lamoTRIgine (LAMICTAL) 200 MG tablet Take 200 mg by mouth 2 (two) times daily.    Yes [provider]  lisinopril (ZESTRIL) 2.5 MG tablet Take 1 tablet (2.5 mg total) by mouth daily. 11/04/20  Yes Sowles, Drue Stager, MD  metoprolol tartrate (LOPRESSOR) 25 MG tablet Take 0.5 tablets (12.5 mg total) by mouth 2 (two) times  daily. 06/08/20 09/06/20 Yes Visser, Jacquelyn D, PA-C  nitroGLYCERIN (NITROSTAT) 0.4 MG SL tablet Place 1 tablet (0.4 mg total) under the tongue every 5 (five) minutes as needed for chest pain. 10/13/20  Yes Minna Merritts, MD  phenazopyridine (PYRIDIUM) 200 MG tablet Take 1 tablet (200 mg total) by mouth 3 (three) times daily as needed for pain. 11/29/20 11/29/21 Yes Bradler, Vista Lawman, MD  promethazine (PHENERGAN) 12.5 MG tablet Take 1 tablet (12.5 mg total) by mouth every 6 (six) hours as needed for nausea or vomiting. 10/21/20  Yes [name], MD  QUEtiapine (SEROQUEL) 200 MG tablet Take 200 mg by mouth at bedtime. Along with 364m dose for a total of 5081m  Yes [provider]  QUEtiapine (SEROQUEL) 300 MG tablet Take 300 mg by mouth at bedtime. Along with 20049mose for a total of 500m2m22/22  Yes [provider]  SYNTHROID 100 MCG tablet Take 100 mcg by mouth daily. 10/01/20  Yes [provider]  TRULICITY 1.5 MG/0YF/7.4BSN Inject 1.5 mg into the skin once a week. (Wednesdays) 10/01/20  Yes [provider]  busPIRone (BUSPAR) 10 MG tablet Take 20 mg by mouth 2 (two) times daily. Patient not taking: Reported on 11/30/2020    Care, [place]  ipratropium (ATROVENT) 0.03 % nasal spray Place 2 sprays into both nostrils every 12 (twelve) hours. Patient not taking: No sig reported 08/03/20   TapiDelsa Grana-C  OneTouch Delica Lancets 30G 49QC by Does not apply route.    [provider]     TRANSFER TO TRH OrleansD.  [facility]  Medical Director [facility]

## 2020-11-30 NOTE — Progress Notes (Signed)
Anticoagulation monitoring(Lovenox):  55 yo female ordered Lovenox 40 mg Q24h  Filed Weights   11/29/20 1909  Weight: 90.7 kg (200 lb)   BMI 40   Lab Results  Component Value Date   CREATININE 1.31 (H) 11/30/2020   CREATININE 1.49 (H) 11/29/2020   CREATININE 1.3 (A) 10/26/2020   Estimated Creatinine Clearance: 46.5 mL/min (A) (by C-G formula based on SCr of 1.31 mg/dL (H)). Hemoglobin & Hematocrit     Component Value Date/Time   HGB 11.3 (L) 11/30/2020 0328   HGB 11.2 09/22/2015 1339   HCT 35.1 (L) 11/30/2020 0328   HCT 34.8 09/22/2015 1339     Per Protocol for Patient with estCrcl > 30 ml/min and BMI > 30, will transition to Lovenox 45 mg Q24h.

## 2020-11-30 NOTE — ED Notes (Signed)
This RN pulled out a D50 ampule and it was cracked open. This RN pulled another D50 ampule and that ampule was broken as well upon opening the box. This RN pulled a 3rd D50 ampule out of pyxis and administered the unbroken ampule to the patient.

## 2020-11-30 NOTE — ED Provider Notes (Signed)
Ongoing care assigned to Dr. Karma Greaser.  Patient being admitted currently under the hospitalist care with the plan to attempt to wean off pressors.  If needed ICU consultation.  I have initiated code sepsis at this time now that the patient has established concern with multiple low blood pressures, need for more fluid resuscitation as well as peripheral vasopressor support.  Vitals:   11/29/20 2330 11/30/20 0030  BP: (!) 73/47 (!) 93/55  Pulse: (!) 58 61  Resp:  17  Temp:    SpO2: 97% 93%     ----------------------------------------- 12:40 AM on 11/30/2020 -----------------------------------------  Sepsis reassessment completed.  Patient improving.  Hypotension improving.  Overall appears to be improving with regard to hemodynamics.  Lactic acid normal.  Further care and management certainly anticipated under the hospitalist service, potentially need to transition to ICU team but at this point is showing improvement with fluid resuscitation in conjunction with low-dose peripheral pressor support.  Meropenem initiated   Delman Kitten, MD 11/30/20 0040

## 2020-11-30 NOTE — Progress Notes (Signed)
Pt called nurse into room stating that she didn't fell well, check of patients BP 79/60, Levophed restarted at 75mg/min

## 2020-11-30 NOTE — Progress Notes (Signed)
CODE SEPSIS - PHARMACY COMMUNICATION  **Broad Spectrum Antibiotics should be administered within 1 hour of Sepsis diagnosis**  Time Code Sepsis Called/Page Received: 3/29  Antibiotics Ordered:  Aztreonam, meropenem, cefdinir   Time of 1st antibiotic administration:  Cefdinir 300 mg PO X 1 on 3/29 @ 0039  Additional action taken by pharmacy:   If necessary, Name of Provider/Nurse Contacted:     Wilder Amodei D ,PharmD Clinical Pharmacist  11/30/2020  1:20 AM

## 2020-12-01 ENCOUNTER — Inpatient Hospital Stay (HOSPITAL_COMMUNITY)
Admit: 2020-12-01 | Discharge: 2020-12-01 | Disposition: A | Discharge status: Home / Self Care | Payer: Medicare Other | Attending: Pulmonary Disease | Admitting: Pulmonary Disease

## 2020-12-01 DIAGNOSIS — F32A Depression, unspecified: Secondary | ICD-10-CM

## 2020-12-01 DIAGNOSIS — R55 Syncope and collapse: Secondary | ICD-10-CM

## 2020-12-01 DIAGNOSIS — F419 Anxiety disorder, unspecified: Secondary | ICD-10-CM

## 2020-12-01 DIAGNOSIS — E039 Hypothyroidism, unspecified: Secondary | ICD-10-CM

## 2020-12-01 LAB — GLUCOSE, CAPILLARY
Glucose-Capillary: 159 mg/dL — ABNORMAL HIGH (ref 70–99)
Glucose-Capillary: 195 mg/dL — ABNORMAL HIGH (ref 70–99)
Glucose-Capillary: 185 mg/dL — ABNORMAL HIGH (ref 70–99)
Glucose-Capillary: 172 mg/dL — ABNORMAL HIGH (ref 70–99)
Glucose-Capillary: 163 mg/dL — ABNORMAL HIGH (ref 70–99)
Glucose-Capillary: 139 mg/dL — ABNORMAL HIGH (ref 70–99)
Glucose-Capillary: 125 mg/dL — ABNORMAL HIGH (ref 70–99)

## 2020-12-01 LAB — CBC
HCT: 33.7 % — ABNORMAL LOW (ref 36.0–46.0)
Hemoglobin: 10.4 g/dL — ABNORMAL LOW (ref 12.0–15.0)
MCH: 29.8 pg (ref 26.0–34.0)
MCHC: 30.9 g/dL (ref 30.0–36.0)
MCV: 96.6 fL (ref 80.0–100.0)
Platelets: 242 10*3/uL (ref 150–400)
RBC: 3.49 MIL/uL — ABNORMAL LOW (ref 3.87–5.11)
RDW: 13.4 % (ref 11.5–15.5)
WBC: 6.4 10*3/uL (ref 4.0–10.5)
nRBC: 0 % (ref 0.0–0.2)

## 2020-12-01 LAB — ECHOCARDIOGRAM COMPLETE
AR max vel: 2.55 cm2
AV Area VTI: 2.55 cm2
AV Area mean vel: 2.45 cm2
AV Mean grad: 4 mmHg
AV Peak grad: 8.2 mmHg
Ao pk vel: 1.43 m/s
Area-P 1/2: 4.71 cm2
Height: 59 in
MV VTI: 2.89 cm2
S' Lateral: 2.8 cm
Weight: 3200 oz

## 2020-12-01 LAB — BASIC METABOLIC PANEL WITH GFR
Anion gap: 12 (ref 5–15)
BUN: 17 mg/dL (ref 6–20)
CO2: 18 mmol/L — ABNORMAL LOW (ref 22–32)
Calcium: 8.9 mg/dL (ref 8.9–10.3)
Chloride: 109 mmol/L (ref 98–111)
Creatinine, Ser: 1.24 mg/dL — ABNORMAL HIGH (ref 0.44–1.00)
GFR, Estimated: 51 mL/min — ABNORMAL LOW
Glucose, Bld: 179 mg/dL — ABNORMAL HIGH (ref 70–99)
Potassium: 4.5 mmol/L (ref 3.5–5.1)
Sodium: 139 mmol/L (ref 135–145)

## 2020-12-01 LAB — THYROID PANEL WITH TSH
Free Thyroxine Index: 1.2 (ref 1.2–4.9)
T3 Uptake Ratio: 27 % (ref 24–39)
T4, Total: 4.4 ug/dL — ABNORMAL LOW (ref 4.5–12.0)
TSH: 0.069 u[IU]/mL — ABNORMAL LOW (ref 0.450–4.500)

## 2020-12-01 LAB — PHOSPHORUS: Phosphorus: 4.4 mg/dL (ref 2.5–4.6)

## 2020-12-01 LAB — PROCALCITONIN: Procalcitonin: <0.1 ng/mL

## 2020-12-01 LAB — URINE CULTURE

## 2020-12-01 LAB — MAGNESIUM: Magnesium: 1.6 mg/dL — ABNORMAL LOW (ref 1.7–2.4)

## 2020-12-01 MED ORDER — SODIUM CHLORIDE 0.9 % IV SOLN
12.5000 mg | Freq: Once | INTRAVENOUS | Status: AC
  Administered 2020-12-01: 12.5 mg via INTRAVENOUS
  Filled 2020-12-01: qty 0.5

## 2020-12-01 MED ORDER — LORAZEPAM 0.5 MG PO TABS
0.5000 mg | ORAL_TABLET | ORAL | Status: DC | PRN
  Administered 2020-12-01 – 2020-12-02 (×3): 0.5 mg via ORAL
  Filled 2020-12-01 (×3): qty 1

## 2020-12-01 MED ORDER — MAGNESIUM SULFATE 2 GM/50ML IV SOLN
2.0000 g | Freq: Once | INTRAVENOUS | Status: AC
  Administered 2020-12-01: 2 g via INTRAVENOUS
  Filled 2020-12-01: qty 50

## 2020-12-01 MED ORDER — LACTATED RINGERS IV BOLUS
1000.0000 mL | Freq: Once | INTRAVENOUS | Status: AC
  Administered 2020-12-01: 1000 mL via INTRAVENOUS

## 2020-12-01 MED ORDER — LEVOTHYROXINE SODIUM 50 MCG PO TABS
75.0000 ug | ORAL_TABLET | Freq: Every day | ORAL | Status: DC
  Administered 2020-12-02 – 2020-12-03 (×2): 75 ug via ORAL
  Filled 2020-12-01 (×2): qty 1

## 2020-12-01 NOTE — Progress Notes (Signed)
1        [facility] [place] NAME: [name]    MR#:  [id number]  DATE OF BIRTH:  [birth year]  SUBJECTIVE:  CHIEF COMPLAINT:   Chief Complaint  Patient presents with  . Urinary Tract Infection  . Dizziness  Reports constipation and some abdominal discomfort, not quite feeling well. REVIEW OF SYSTEMS:  Review of Systems  Constitutional: Positive for malaise/fatigue. Negative for diaphoresis, fever and weight loss.  HENT: Negative for ear discharge, ear pain, hearing loss, nosebleeds, sore throat and tinnitus.   Eyes: Negative for blurred vision and pain.  Respiratory: Negative for cough, hemoptysis, shortness of breath and wheezing.   Cardiovascular: Negative for chest pain, palpitations, orthopnea and leg swelling.  Gastrointestinal: Positive for abdominal pain and constipation. Negative for blood in stool, diarrhea, heartburn, nausea and vomiting.  Genitourinary: Negative for dysuria, frequency and urgency.  Musculoskeletal: Negative for back pain and myalgias.  Skin: Negative for itching and rash.  Neurological: Positive for dizziness. Negative for tingling, tremors, focal weakness, seizures, weakness and headaches.  Psychiatric/Behavioral: Negative for depression. The patient is not nervous/anxious.    DRUG ALLERGIES:   Allergies  Allergen Reactions  . Mirtazapine Anaphylaxis    REACTION: closes throat  . Morphine And Related     anaphylaxis  . Sulfamethoxazole-Trimethoprim Rash    REACTION: Whelps all over  . Vicodin Hp [Hydrocodone-Acetaminophen]   . Amoxicillin Er Nausea And Vomiting    Pt doesn't remember problem with amoxicillin -  SE Has patient had a PCN reaction causing immediate rash, facial/tongue/throat swelling, SOB or lightheadedness with hypotension: No Has patient had a PCN reaction causing severe rash involving mucus membranes or skin necrosis: No Has patient had a PCN reaction that required hospitalization: No Has patient had  a PCN reaction occurring within the last 10 years: Yes If all of the above answers are "NO", then may proceed with Cephalosporin use.  . Codeine Itching    REACTION: itch, rash  . Hydrocodone-Acetaminophen Rash    REACTION: rash  . Keflex [Cephalexin] Rash    Pt does not remember having a reaction or rash, no airway involvement  . Prasterone Nausea And Vomiting   VITALS:  Blood pressure (!) 111/52, pulse 87, temperature (!) 97.5 F (36.4 C), temperature source Oral, resp. rate (!) 22, height 4' 11"  (1.499 m), weight 90.7 kg, SpO2 94 %. PHYSICAL EXAMINATION:  Physical Exam  55 year old obese female lying in the bed comfortably without any acute distress Cardiovascular S1-S2 normal no murmur rubs or gallop Lungs clear to auscultation, decreased breath sound the bases Abdomen soft nontender nondistended bowel sounds present Neuro alert and oriented, nonfocal exam Extremities: No pedal edema cyanosis or clubbing LABORATORY PANEL:  Female CBC Recent Labs  Lab 12/01/20 0511  WBC 6.4  HGB 10.4*  HCT 33.7*  PLT 242   ------------------------------------------------------------------------------------------------------------------ Chemistries  Recent Labs  Lab 11/30/20 0328 12/01/20 0511  NA 138 139  K 4.0 4.5  CL 106 109  CO2 24 18*  GLUCOSE 156* 179*  BUN 27* 17  CREATININE 1.31* 1.24*  CALCIUM 8.9 8.9  MG  --  1.6*  AST 23  --   ALT 23  --   ALKPHOS 40  --   BILITOT 0.5  --    RADIOLOGY:  ECHOCARDIOGRAM COMPLETE  Result Date: 12/01/2020    ECHOCARDIOGRAM REPORT   Patient Name:   [name] Date of Exam: 12/01/2020 Medical Rec #:  [id number]  Height:       59.0 in Accession #:    [id number]     Weight:       200.0 lb Date of Birth:  [birth year]       BSA:          1.844 m Patient Age:    55 years       BP:           129/54 mmHg Patient Gender: F              HR:           90 bpm. Exam Location:  [facility] Procedure: 2D Echo, Color Doppler, Cardiac Doppler and Strain  Analysis Indications:     R55 Syncope  History:         Patient has prior history of Echocardiogram examinations, most                  recent 06/08/2020. CHF, CAD, COPD and CKD; Risk                  Factors:Diabetes, Sleep Apnea, Hypertension and Dyslipidemia.  Sonographer:     Charmayne Sheer RDCS (AE) Referring Phys:  [id number] BRITTON L [place] Diagnosing Phys: Kathlyn Sacramento MD  Sonographer Comments: Global longitudinal strain was attempted. IMPRESSIONS  1. Left ventricular ejection fraction, by estimation, is 60 to 65%. The left ventricle has normal function. The left ventricle has no regional wall motion abnormalities. There is mild left ventricular hypertrophy. Left ventricular diastolic parameters were normal. The global longitudinal strain is normal.  2. Right ventricular systolic function is normal. The right ventricular size is normal. Tricuspid regurgitation signal is inadequate for assessing PA pressure.  3. The mitral valve is normal in structure. No evidence of mitral valve regurgitation. No evidence of mitral stenosis.  4. The aortic valve is normal in structure. Aortic valve regurgitation is not visualized. No aortic stenosis is present. FINDINGS  Left Ventricle: Left ventricular ejection fraction, by estimation, is 60 to 65%. The left ventricle has normal function. The left ventricle has no regional wall motion abnormalities. The global longitudinal strain is normal. The left ventricular internal cavity size was normal in size. There is mild left ventricular hypertrophy. Left ventricular diastolic parameters were normal. Right Ventricle: The right ventricular size is normal. No increase in right ventricular wall thickness. Right ventricular systolic function is normal. Tricuspid regurgitation signal is inadequate for assessing PA pressure. Left Atrium: Left atrial size was normal in size. Right Atrium: Right atrial size was normal in size. Pericardium: There is no evidence of pericardial effusion.  Mitral Valve: The mitral valve is normal in structure. No evidence of mitral valve regurgitation. No evidence of mitral valve stenosis. MV peak gradient, 7.5 mmHg. The mean mitral valve gradient is 2.0 mmHg. Tricuspid Valve: The tricuspid valve is normal in structure. Tricuspid valve regurgitation is not demonstrated. No evidence of tricuspid stenosis. Aortic Valve: The aortic valve is normal in structure. Aortic valve regurgitation is not visualized. No aortic stenosis is present. Aortic valve mean gradient measures 4.0 mmHg. Aortic valve peak gradient measures 8.2 mmHg. Aortic valve area, by VTI measures 2.55 cm. Pulmonic Valve: The pulmonic valve was normal in structure. Pulmonic valve regurgitation is not visualized. No evidence of pulmonic stenosis. Aorta: The aortic root is normal in size and structure. Venous: The inferior vena cava was not well visualized. IAS/Shunts: No atrial level shunt detected by color flow Doppler.  LEFT VENTRICLE PLAX 2D LVIDd:  3.90 cm  Diastology LVIDs:         2.80 cm  LV e' medial:    6.64 cm/s LV PW:         1.10 cm  LV E/e' medial:  10.9 LV IVS:        1.00 cm  LV e' lateral:   9.57 cm/s LVOT diam:     2.00 cm  LV E/e' lateral: 7.6 LV SV:         64 LV SV Index:   35 LVOT Area:     3.14 cm  RIGHT VENTRICLE RV Basal diam:  2.70 cm LEFT ATRIUM             Index       RIGHT ATRIUM           Index LA diam:        3.90 cm 2.11 cm/m  RA Area:     10.00 cm LA Vol (A2C):   35.5 ml 19.25 ml/m RA Volume:   20.70 ml  11.22 ml/m LA Vol (A4C):   39.5 ml 21.42 ml/m LA Biplane Vol: 38.0 ml 20.60 ml/m  AORTIC VALVE                   PULMONIC VALVE AV Area (Vmax):    2.55 cm    PV Vmax:       1.15 m/s AV Area (Vmean):   2.45 cm    PV Vmean:      79.800 cm/s AV Area (VTI):     2.55 cm    PV VTI:        0.225 m AV Vmax:           143.00 cm/s PV Peak grad:  5.3 mmHg AV Vmean:          99.300 cm/s PV Mean grad:  3.0 mmHg AV VTI:            0.253 m AV Peak Grad:      8.2 mmHg AV  Mean Grad:      4.0 mmHg LVOT Vmax:         116.00 cm/s LVOT Vmean:        77.500 cm/s LVOT VTI:          0.205 m LVOT/AV VTI ratio: 0.81  AORTA Ao Root diam: 2.80 cm MITRAL VALVE MV Area (PHT): 4.71 cm    SHUNTS MV Area VTI:   2.89 cm    Systemic VTI:  0.20 m MV Peak grad:  7.5 mmHg    Systemic Diam: 2.00 cm MV Mean grad:  2.0 mmHg MV Vmax:       1.37 m/s MV Vmean:      72.7 cm/s MV Decel Time: 161 msec MV E velocity: 72.40 cm/s MV A velocity: 89.50 cm/s MV E/A ratio:  0.81 Kathlyn Sacramento MD Electronically signed by Kathlyn Sacramento MD Signature Date/Time: 12/01/2020/1:10:21 PM    Final    ASSESSMENT AND PLAN:  56 year old female with a known history of CHF, COPD, coronary artery disease, type 2 diabetes with diabetic neuropathy, GERD, hypertension, dyslipidemia, hypothyroidism, morbid obesity, CKD is admitted for syncope and hypotension.  Septic shock from UTI present on admission Initially required fluid boluses and Levophed which has been weaned off today -Urine culture growing multiple species, needs recollection -Continue IV cefepime for now  Syncope likely from septic shock/hypotension Started midodrine 10 mg p.o. 3 times daily and wean off Levophed Check orthostatic vitals PT, OT  eval once blood pressure stabilized  Anxiety, depression and bipolar disorder Continue BuSpar, Celexa, Lamictal and Seroquel Trazodone nightly as needed for sleep and add Ativan 0.5 mg p.o. every 4 hours as needed for anxiety  Hypothyroidism - TSH is low and low free T4  -Cut back on Synthroid dose from 100 to 75 mcg on 3/30 - Consider recheck in few weeks  Obesity Body mass index is 40.4 kg/m.  Net IO Since Admission: 3,820.55 mL [12/01/20 1722]      Status is: Inpatient  Remains inpatient appropriate because:Hemodynamically unstable   Dispo: The patient is from: Home              Anticipated d/c is to: Home              Patient currently is not medically stable to d/c.   Difficult to place  patient No   Can transfer her to PCU once off pressors.  I have placed order to transfer her on 3/30    DVT prophylaxis:        Lovenox subcu    Family Communication:  "discussed with patient"   All the records are reviewed and case discussed with Care Management/Social Worker. Management plans discussed with the patient, nursing and they are in agreement.  CODE STATUS: Full Code Level of care: Progressive Cardiac  TOTAL TIME TAKING CARE OF THIS PATIENT: 35 minutes.   More than 50% of the time was spent in counseling/coordination of care: YES  POSSIBLE D/C IN 2-3 DAYS, DEPENDING ON CLINICAL CONDITION.   Max Sane M.D on 12/01/2020 at 5:22 PM  [facility]   CC: Primary care physician; Steele Sizer, MD  Note: This dictation was prepared with Dragon dictation along with smaller phrase technology. Any transcriptional errors that result from this process are unintentional.

## 2020-12-01 NOTE — Progress Notes (Signed)
Neuro: A & O, anxious throughout the day, tearful at times, worried about her health outcomes Resp: Stable on room air CV: afebrile, titrated Levo off successfully, vital signs stable GIGU: BM x 2, voiding via purewick, emesis x 1 following dinner, zofran administered with relief Skin: clean dry and intact Social: Husband and daughter came to visit today, appears the patient has a good support system. All questions and concerns addressed.   Events: Patient transitioned from ICU status to Progressive cardiac, report given at 1945 to [facility], night shift will take over care and transfer the patient to 2A

## 2020-12-01 NOTE — Progress Notes (Signed)
*  PRELIMINARY RESULTS* Echocardiogram 2D Echocardiogram has been performed.  [name] English Tomer 12/01/2020, 12:25 PM

## 2020-12-02 ENCOUNTER — Encounter: Payer: Self-pay | Admitting: Family Medicine

## 2020-12-02 DIAGNOSIS — A419 Sepsis, unspecified organism: Principal | ICD-10-CM

## 2020-12-02 DIAGNOSIS — R6521 Severe sepsis with septic shock: Secondary | ICD-10-CM

## 2020-12-02 LAB — GLUCOSE, CAPILLARY
Glucose-Capillary: 104 mg/dL — ABNORMAL HIGH (ref 70–99)
Glucose-Capillary: 143 mg/dL — ABNORMAL HIGH (ref 70–99)
Glucose-Capillary: 152 mg/dL — ABNORMAL HIGH (ref 70–99)
Glucose-Capillary: 125 mg/dL — ABNORMAL HIGH (ref 70–99)

## 2020-12-02 LAB — CBC
HCT: 32.4 % — ABNORMAL LOW (ref 36.0–46.0)
Hemoglobin: 10.5 g/dL — ABNORMAL LOW (ref 12.0–15.0)
MCH: 30.5 pg (ref 26.0–34.0)
MCHC: 32.4 g/dL (ref 30.0–36.0)
MCV: 94.2 fL (ref 80.0–100.0)
Platelets: 233 10*3/uL (ref 150–400)
RBC: 3.44 MIL/uL — ABNORMAL LOW (ref 3.87–5.11)
RDW: 13.6 % (ref 11.5–15.5)
WBC: 6.6 10*3/uL (ref 4.0–10.5)
nRBC: 0 % (ref 0.0–0.2)

## 2020-12-02 LAB — BASIC METABOLIC PANEL
Anion gap: 12 (ref 5–15)
BUN: 14 mg/dL (ref 6–20)
CO2: 23 mmol/L (ref 22–32)
Calcium: 9.4 mg/dL (ref 8.9–10.3)
Chloride: 106 mmol/L (ref 98–111)
Creatinine, Ser: 0.93 mg/dL (ref 0.44–1.00)
GFR, Estimated: >60 mL/min (ref >60)
Glucose, Bld: 110 mg/dL — ABNORMAL HIGH (ref 70–99)
Potassium: 4.1 mmol/L (ref 3.5–5.1)
Sodium: 141 mmol/L (ref 135–145)

## 2020-12-02 LAB — MAGNESIUM: Magnesium: 1.8 mg/dL (ref 1.7–2.4)

## 2020-12-02 LAB — PROCALCITONIN: Procalcitonin: <0.1 ng/mL

## 2020-12-02 MED ORDER — SODIUM CHLORIDE 0.9 % IV SOLN
2.0000 g | Freq: Three times a day (TID) | INTRAVENOUS | Status: DC
  Administered 2020-12-02 – 2020-12-03 (×2): 2 g via INTRAVENOUS
  Filled 2020-12-02 (×4): qty 2

## 2020-12-02 MED ORDER — METOPROLOL TARTRATE 25 MG PO TABS
12.5000 mg | ORAL_TABLET | Freq: Two times a day (BID) | ORAL | Status: DC
  Administered 2020-12-02 – 2020-12-03 (×2): 12.5 mg via ORAL
  Filled 2020-12-02 (×2): qty 1

## 2020-12-02 MED ORDER — SUMATRIPTAN SUCCINATE 50 MG PO TABS
25.0000 mg | ORAL_TABLET | ORAL | Status: DC | PRN
Start: 2020-12-02 — End: 2020-12-03
  Administered 2020-12-02: 25 mg via ORAL
  Filled 2020-12-02 (×3): qty 1

## 2020-12-02 NOTE — Progress Notes (Signed)
Progress Note    [name]  HUD:[id number] DOB: 28-Feb-1966  DOA: 11/29/2020 PCP: Steele Sizer, MD      Brief Narrative:    Medical records reviewed and are as summarized below:  [name] is a 55 y.o. female       Assessment/Plan:   Principal Problem:   Severe sepsis with septic shock ([place]) Active Problems:   Syncope    Body mass index is 40.4 kg/m.  (Morbid obesity)   Septic shock secondary to UTI: Continue empiric IV cefepime.  Urine culture showed multiple species.  BP has improved.  Discontinue midodrine.  S/p syncope: This is likely due to septic shock/hypotension.  Chronic sinus tachycardia: Resume home dose of metoprolol.  Generalized weakness: Ambulate with assistance.   Migraine headache: Trial of sumatriptan.  Other comorbidities include anxiety, depression, bipolar disorder, hypothyroidism, type 2 diabetes mellitus, history of los  ing gastric ulcer, NASH , restless leg syndrome, chronic back pain   Diet Order            Diet Heart Room service appropriate? Yes; Fluid consistency: Thin  Diet effective now                    Consultants:  Intensivist  Procedures:  None    Medications:   . atorvastatin  80 mg Oral QHS  . busPIRone  30 mg Oral BID  . citalopram  20 mg Oral Daily  . enoxaparin (LOVENOX) injection  45 mg Subcutaneous Q24H  . ezetimibe  10 mg Oral Daily  . fenofibrate  160 mg Oral Daily  . gabapentin  300 mg Oral BID  . gabapentin  600 mg Oral QHS  . insulin aspart  0-15 Units Subcutaneous TID WC  . insulin aspart  0-5 Units Subcutaneous QHS  . lamoTRIgine  200 mg Oral BID  . levothyroxine  75 mcg Oral Q0600  . metoprolol tartrate  12.5 mg Oral BID  . pantoprazole  40 mg Oral Daily  . QUEtiapine  200 mg Oral QHS   Continuous Infusions: . sodium chloride Stopped (11/30/20 0328)  . sodium chloride 150 mL/hr at 12/02/20 1255  . ceFEPime (MAXIPIME) IV       Anti-infectives (From admission,  onward)   Start     Dose/Rate Route Frequency Ordered Stop   12/02/20 2200  ceFEPIme (MAXIPIME) 2 g in sodium chloride 0.9 % 100 mL IVPB        2 g 200 mL/hr over 30 Minutes Intravenous Every 8 hours 12/02/20 1323     11/30/20 0800  ceFEPIme (MAXIPIME) 2 g in sodium chloride 0.9 % 100 mL IVPB  Status:  Discontinued        2 g 200 mL/hr over 30 Minutes Intravenous Every 12 hours 11/30/20 0232 11/30/20 0749   11/30/20 0800  ceFEPIme (MAXIPIME) 2 g in sodium chloride 0.9 % 100 mL IVPB  Status:  Discontinued        2 g 200 mL/hr over 30 Minutes Intravenous Every 12 hours 11/30/20 0749 12/02/20 1323   11/30/20 0100  aztreonam (AZACTAM) 2 g in sodium chloride 0.9 % 100 mL IVPB  Status:  Discontinued        2 g 200 mL/hr over 30 Minutes Intravenous  Once 11/30/20 0053 11/30/20 0232   11/30/20 0045  meropenem (MERREM) 1 g in sodium chloride 0.9 % 100 mL IVPB        1 g 200 mL/hr over 30 Minutes Intravenous  Once 11/30/20 0039 11/30/20 0239   11/29/20 2025  cefdinir (OMNICEF) capsule 300 mg        300 mg Oral  Once 11/29/20 2025 11/29/20 2159   11/29/20 0000  cefdinir (OMNICEF) 300 MG capsule        300 mg Oral 2 times daily 11/29/20 2028 12/04/20 2359             Family Communication/Anticipated D/C date and plan/Code Status   DVT prophylaxis:      Code Status: Full Code  Family Communication:  Disposition Plan:    Status is: Inpatient  Remains inpatient appropriate because:Unsafe d/c plan and IV treatments appropriate due to intensity of illness or inability to take PO   Dispo: The patient is from: Home              Anticipated d/c is to: Home              Patient currently is not medically stable to d/c.   Difficult to place patient No           Subjective:   C/o generalized weakness  Objective:    Vitals:   12/02/20 0509 12/02/20 0749 12/02/20 1121 12/02/20 1522  BP: 127/78 116/69 (!) 148/62 (!) 149/60  Pulse: (!) 120 (!) 109 99 94  Resp: 18 15 15 17    Temp: 99.1 F (37.3 C) 98.2 F (36.8 C) 98.5 F (36.9 C) 98.6 F (37 C)  TempSrc: Oral Oral Oral Oral  SpO2: 98% 97% 96% 95%  Weight:      Height:       No data found.   Intake/Output Summary (Last 24 hours) at 12/02/2020 1556 Last data filed at 12/02/2020 1415 Gross per 24 hour  Intake 713.14 ml  Output 2900 ml  Net -2186.86 ml   Filed Weights   11/29/20 1909  Weight: 90.7 kg    Exam:  GEN: NAD SKIN: No rash EYES: EOMI ENT: MMM CV: RRR PULM: CTA B ABD: soft, ND, NT, +BS CNS: AAO x 3, non focal EXT: No edema or tenderness        Data Reviewed:   I have personally reviewed following labs and imaging studies:  Labs: Labs show the following:   Basic Metabolic Panel: Recent Labs  Lab 11/29/20 2225 11/30/20 0328 12/01/20 0511 12/02/20 0607  NA 139 138 139 141  K 4.2 4.0 4.5 4.1  CL 104 106 109 106  CO2 26 24 18* 23  GLUCOSE 87 156* 179* 110*  BUN 28* 27* 17 14  CREATININE 1.49* 1.31* 1.24* 0.93  CALCIUM 9.8 8.9 8.9 9.4  MG  --   --  1.6* 1.8  PHOS  --   --  4.4  --    GFR Estimated Creatinine Clearance: 65.5 mL/min (by C-G formula based on SCr of 0.93 mg/dL). Liver Function Tests: Recent Labs  Lab 11/29/20 2225 11/30/20 0328  AST 29 23  ALT 26 23  ALKPHOS 43 40  BILITOT 0.5 0.5  PROT 6.4* 5.6*  ALBUMIN 3.8 3.3*   No results for input(s): LIPASE, AMYLASE in the last 168 hours. No results for input(s): AMMONIA in the last 168 hours. Coagulation profile Recent Labs  Lab 11/30/20 0328  INR 1.1    CBC: Recent Labs  Lab 11/29/20 1921 11/30/20 0328 12/01/20 0511 12/02/20 0607  WBC 9.8 8.4 6.4 6.6  NEUTROABS 4.8 5.0  --   --   HGB 12.8 11.3* 10.4* 10.5*  HCT 41.7 35.1* 33.7*  32.4*  MCV 98.8 94.4 96.6 94.2  PLT 315 232 242 233   Cardiac Enzymes: No results for input(s): CKTOTAL, CKMB, CKMBINDEX, TROPONINI in the last 168 hours. BNP (last 3 results) No results for input(s): PROBNP in the last 8760 hours. CBG: Recent Labs   Lab 12/01/20 1822 12/01/20 1923 12/01/20 2108 12/02/20 0750 12/02/20 1122  GLUCAP 163* 139* 125* 104* 143*   D-Dimer: Recent Labs    11/30/20 0328  DDIMER 0.35   Hgb A1c: No results for input(s): HGBA1C in the last 72 hours. Lipid Profile: No results for input(s): CHOL, HDL, LDLCALC, TRIG, CHOLHDL, LDLDIRECT in the last 72 hours. Thyroid function studies: Recent Labs    11/30/20 0328  TSH 0.069*  T4TOTAL 4.4*   Anemia work up: No results for input(s): VITAMINB12, FOLATE, FERRITIN, TIBC, IRON, RETICCTPCT in the last 72 hours. Sepsis Labs: Recent Labs  Lab 11/29/20 1921 11/29/20 2225 11/30/20 0328 11/30/20 1010 12/01/20 0511 12/02/20 0607  PROCALCITON  --   --  <0.10  --  <0.10 <0.10  WBC 9.8  --  8.4  --  6.4 6.6  LATICACIDVEN  --  1.9 1.1 1.2  --   --     Microbiology Recent Results (from the past 240 hour(s))  Urine culture     Status: Abnormal   Collection Time: 11/29/20  7:15 PM   Specimen: Urine, Catheterized  Result Value Ref Range Status   Specimen Description   Final    URINE, CATHETERIZED Performed at [facility], [address] [place], [place] [address]    Special Requests   Final    NONE Performed at [facility], [address] [place], [place] [address]    Culture MULTIPLE SPECIES PRESENT, SUGGEST RECOLLECTION (A)  Final   Report Status 12/01/2020 FINAL  Final  Culture, blood (Routine X 2) w Reflex to ID Panel     Status: None (Preliminary result)   Collection Time: 11/29/20 10:25 PM   Specimen: BLOOD  Result Value Ref Range Status   Specimen Description BLOOD FOREARM  Final   Special Requests   Final    BOTTLES DRAWN AEROBIC AND ANAEROBIC Blood Culture adequate volume   Culture   Final    NO GROWTH 3 DAYS Performed at [facility], [address], [place], [place] [address]    Report Status PENDING  Incomplete  Resp Panel by RT-PCR (Flu A&B, Covid) Nasopharyngeal Swab     Status: None   Collection  Time: 11/29/20 11:00 PM   Specimen: Nasopharyngeal Swab; Nasopharyngeal(NP) swabs in vial transport medium  Result Value Ref Range Status   SARS Coronavirus 2 by RT PCR NEGATIVE NEGATIVE Final    Comment: (NOTE) SARS-CoV-2 target nucleic acids are NOT DETECTED.  The SARS-CoV-2 RNA is generally detectable in upper respiratory specimens during the acute phase of infection. The lowest concentration of SARS-CoV-2 viral copies this assay can detect is 138 copies/mL. A negative result does not preclude SARS-Cov-2 infection and should not be used as the sole basis for treatment or other patient management decisions. A negative result may occur with  improper specimen collection/handling, submission of specimen other than nasopharyngeal swab, presence of viral mutation(s) within the areas targeted by this assay, and inadequate number of viral copies(<138 copies/mL). A negative result must be combined with clinical observations, patient history, and epidemiological information. The expected result is Negative.  Fact Sheet for Patients:  EntrepreneurPulse.com.au  Fact Sheet for Healthcare Providers:  IncredibleEmployment.be  This test is  no t yet approved or cleared by the [place] and  has been authorized for detection and/or diagnosis of SARS-CoV-2 by FDA under an Emergency Use Authorization (EUA). This EUA will remain  in effect (meaning this test can be used) for the duration of the COVID-19 declaration under Section 564(b)(1) of the Act, 21 U.S.C.section 360bbb-3(b)(1), unless the authorization is terminated  or revoked sooner.       Influenza A by PCR NEGATIVE NEGATIVE Final   Influenza B by PCR NEGATIVE NEGATIVE Final    Comment: (NOTE) The Xpert Xpress SARS-CoV-2/FLU/RSV plus assay is intended as an aid in the diagnosis of influenza from Nasopharyngeal swab specimens and should not be used as a sole basis for treatment. Nasal washings  and aspirates are unacceptable for Xpert Xpress SARS-CoV-2/FLU/RSV testing.  Fact Sheet for Patients: EntrepreneurPulse.com.au  Fact Sheet for Healthcare Providers: IncredibleEmployment.be  This test is not yet approved or cleared by the [place] FDA and has been authorized for detection and/or diagnosis of SARS-CoV-2 by FDA under an Emergency Use Authorization (EUA). This EUA will remain in effect (meaning this test can be used) for the duration of the COVID-19 declaration under Section 564(b)(1) of the Act, 21 U.S.C. section 360bbb-3(b)(1), unless the authorization is terminated or revoked.  Performed at [facility], [address] [place], [place] [address]   Culture, blood (x 2)     Status: None (Preliminary result)   Collection Time: 11/30/20  3:28 AM   Specimen: BLOOD  Result Value Ref Range Status   Specimen Description BLOOD BLOOD LEFT HAND  Final   Special Requests   Final    BOTTLES DRAWN AEROBIC AND ANAEROBIC Blood Culture adequate volume   Culture   Final    NO GROWTH 2 DAYS Performed at [facility], [address] [place], [place] [address]    Report Status PENDING  Incomplete  MRSA PCR Screening     Status: None   Collection Time: 11/30/20 12:05 PM   Specimen: Nasal Mucosa; Nasopharyngeal  Result Value Ref Range Status   MRSA by PCR NEGATIVE NEGATIVE Final    Comment:        The GeneXpert MRSA Assay (FDA approved for NASAL specimens only), is one component of a comprehensive MRSA colonization surveillance program. It is not intended to diagnose MRSA infection nor to guide or monitor treatment for MRSA infections. Performed at [facility], [address] [place], [place] [address]     Procedures and diagnostic studies:  ECHOCARDIOGRAM COMPLETE  Result Date: 12/01/2020    ECHOCARDIOGRAM REPORT   Patient Name:   [name] Date of Exam: 12/01/2020 Medical Rec #:  [id number]       Height:       59.0 in Accession #:    [id number]     Weight:       200.0 lb Date of Birth:  1966-01-12       BSA:          1.844 m Patient Age:    89 years       BP:           129/54 mmHg Patient Gender: F              HR:           90 bpm. Exam Location:  [facility] Procedure: 2D Echo, Color Doppler, Cardiac Doppler and Strain Analysis Indications:     R55 Syncope  History:  Patient has prior history of Echocardiogram examinations, most                  recent 06/08/2020. CHF, CAD, COPD and CKD; Risk                  Factors:Diabetes, Sleep Apnea, Hypertension and Dyslipidemia.  Sonographer:     Charmayne Sheer RDCS (AE) Referring Phys:  [id number] BRITTON L RUST-CHESTER Diagnosing Phys: Kathlyn Sacramento MD  Sonographer Comments: Global longitudinal strain was attempted. IMPRESSIONS  1. Left ventricular ejection fraction, by estimation, is 60 to 65%. The left ventricle has normal function. The left ventricle has no regional wall motion abnormalities. There is mild left ventricular hypertrophy. Left ventricular diastolic parameters were normal. The global longitudinal strain is normal.  2. Right ventricular systolic function is normal. The right ventricular size is normal. Tricuspid regurgitation signal is inadequate for assessing PA pressure.  3. The mitral valve is normal in structure. No evidence of mitral valve regurgitation. No evidence of mitral stenosis.  4. The aortic valve is normal in structure. Aortic valve regurgitation is not visualized. No aortic stenosis is present. FINDINGS  Left Ventricle: Left ventricular ejection fraction, by estimation, is 60 to 65%. The left ventricle has normal function. The left ventricle has no regional wall motion abnormalities. The global longitudinal strain is normal. The left ventricular internal cavity size was normal in size. There is mild left ventricular hypertrophy. Left ventricular diastolic parameters were normal. Right Ventricle: The right ventricular size is normal. No  increase in right ventricular wall thickness. Right ventricular systolic function is normal. Tricuspid regurgitation signal is inadequate for assessing PA pressure. Left Atrium: Left atrial size was normal in size. Right Atrium: Right atrial size was normal in size. Pericardium: There is no evidence of pericardial effusion. Mitral Valve: The mitral valve is normal in structure. No evidence of mitral valve regurgitation. No evidence of mitral valve stenosis. MV peak gradient, 7.5 mmHg. The mean mitral valve gradient is 2.0 mmHg. Tricuspid Valve: The tricuspid valve is normal in structure. Tricuspid valve regurgitation is not demonstrated. No evidence of tricuspid stenosis. Aortic Valve: The aortic valve is normal in structure. Aortic valve regurgitation is not visualized. No aortic stenosis is present. Aortic valve mean gradient measures 4.0 mmHg. Aortic valve peak gradient measures 8.2 mmHg. Aortic valve area, by VTI measures 2.55 cm. Pulmonic Valve: The pulmonic valve was normal in structure. Pulmonic valve regurgitation is not visualized. No evidence of pulmonic stenosis. Aorta: The aortic root is normal in size and structure. Venous: The inferior vena cava was not well visualized. IAS/Shunts: No atrial level shunt detected by color flow Doppler.  LEFT VENTRICLE PLAX 2D LVIDd:         3.90 cm  Diastology LVIDs:         2.80 cm  LV e' medial:    6.64 cm/s LV PW:         1.10 cm  LV E/e' medial:  10.9 LV IVS:        1.00 cm  LV e' lateral:   9.57 cm/s LVOT diam:     2.00 cm  LV E/e' lateral: 7.6 LV SV:         64 LV SV Index:   35 LVOT Area:     3.14 cm  RIGHT VENTRICLE RV Basal diam:  2.70 cm LEFT ATRIUM             Index       RIGHT ATRIUM  Index LA diam:        3.90 cm 2.11 cm/m  RA Area:     10.00 cm LA Vol (A2C):   35.5 ml 19.25 ml/m RA Volume:   20.70 ml  11.22 ml/m LA Vol (A4C):   39.5 ml 21.42 ml/m LA Biplane Vol: 38.0 ml 20.60 ml/m  AORTIC VALVE                   PULMONIC VALVE AV Area  (Vmax):    2.55 cm    PV Vmax:       1.15 m/s AV Area (Vmean):   2.45 cm    PV Vmean:      79.800 cm/s AV Area (VTI):     2.55 cm    PV VTI:        0.225 m AV Vmax:           143.00 cm/s PV Peak grad:  5.3 mmHg AV Vmean:          99.300 cm/s PV Mean grad:  3.0 mmHg AV VTI:            0.253 m AV Peak Grad:      8.2 mmHg AV Mean Grad:      4.0 mmHg LVOT Vmax:         116.00 cm/s LVOT Vmean:        77.500 cm/s LVOT VTI:          0.205 m LVOT/AV VTI ratio: 0.81  AORTA Ao Root diam: 2.80 cm MITRAL VALVE MV Area (PHT): 4.71 cm    SHUNTS MV Area VTI:   2.89 cm    Systemic VTI:  0.20 m MV Peak grad:  7.5 mmHg    Systemic Diam: 2.00 cm MV Mean grad:  2.0 mmHg MV Vmax:       1.37 m/s MV Vmean:      72.7 cm/s MV Decel Time: 161 msec MV E velocity: 72.40 cm/s MV A velocity: 89.50 cm/s MV E/A ratio:  0.81 Kathlyn Sacramento MD Electronically signed by Kathlyn Sacramento MD Signature Date/Time: 12/01/2020/1:10:21 PM    Final                LOS: 2 days   Brodi Nery  [facility]   Pager on [email or web address]. If 7PM-7AM, please contact night-coverage at [email or web address]     12/02/2020, 3:56 PM

## 2020-12-02 NOTE — Progress Notes (Signed)
Mobility Specialist - Progress Note   12/02/20 1700  Mobility  Activity Refused mobility  Mobility performed by Mobility specialist    Pt sleeping upon arrival and politely declines mobility d/t wanting to rest. Pt requests to return next available date.    Kathee Delton Mobility Specialist 12/02/20, 5:24 PM

## 2020-12-02 NOTE — Plan of Care (Signed)
  Problem: Health Behavior/Discharge Planning: Goal: Ability to manage health-related needs will improve Outcome: Progressing   Problem: Clinical Measurements: Goal: Will remain free from infection Outcome: Progressing Goal: Respiratory complications will improve Outcome: Progressing Goal: Cardiovascular complication will be avoided Outcome: Progressing

## 2020-12-02 NOTE — Progress Notes (Signed)
   12/02/20 0509  Vitals  Temp 99.1 F (37.3 C)  Temp Source Oral  BP 127/78  MAP (mmHg) 94  BP Location Left Arm  BP Method Automatic  Patient Position (if appropriate) Lying  Pulse Rate (!) 120  Pulse Rate Source Monitor  Resp 18  Level of Consciousness  Level of Consciousness Alert  MEWS COLOR  MEWS Score Color Yellow  Oxygen Therapy  SpO2 98 %  O2 Device Nasal Cannula  Pain Assessment  Pain Scale 0-10  Pain Score 3  Pain Type Acute pain  Pain Location Head  Pain Orientation Left  Pain Descriptors / Indicators Aching  Pain Frequency Constant  Pain Onset On-going  Patients Stated Pain Goal 0  Pain Intervention(s) Repositioned;Emotional support;Environmental changes;Relaxation;Rest;Therapeutic touch  POSS Scale ([place] Opioid Sedation Scale)  POSS *See Group Information* 1-Acceptable,Awake and alert  Glasgow Coma Scale  Eye Opening 4  Best Verbal Response (NON-intubated) 5  Modified Verbal Response (INTUBATED) 5  Best Motor Response 6  Glasgow Coma Scale Score (!) 20  MEWS Score  MEWS Temp 0  MEWS Systolic 0  MEWS Pulse 2  MEWS RR 0  MEWS LOC 0  MEWS Score 2  Provider Notification  Provider Name/Title Damita Dunnings MD  Date Provider Notified 12/02/20  Time Provider Notified [phone]  Notification Type  (secure chat)  Notification Reason Change in status (HR)  Provider response No new orders  Date of Provider Response 12/02/20  Time of Provider Response [phone]

## 2020-12-03 DIAGNOSIS — N390 Urinary tract infection, site not specified: Secondary | ICD-10-CM

## 2020-12-03 LAB — BASIC METABOLIC PANEL
Anion gap: 10 (ref 5–15)
BUN: 14 mg/dL (ref 6–20)
CO2: 24 mmol/L (ref 22–32)
Calcium: 9.5 mg/dL (ref 8.9–10.3)
Chloride: 105 mmol/L (ref 98–111)
Creatinine, Ser: 0.96 mg/dL (ref 0.44–1.00)
GFR, Estimated: >60 mL/min (ref >60)
Glucose, Bld: 138 mg/dL — ABNORMAL HIGH (ref 70–99)
Potassium: 4.3 mmol/L (ref 3.5–5.1)
Sodium: 139 mmol/L (ref 135–145)

## 2020-12-03 LAB — GLUCOSE, CAPILLARY
Glucose-Capillary: 144 mg/dL — ABNORMAL HIGH (ref 70–99)
Glucose-Capillary: 263 mg/dL — ABNORMAL HIGH (ref 70–99)

## 2020-12-03 MED ORDER — CEFDINIR 300 MG PO CAPS: 300.0000 mg | ORAL_CAPSULE | Freq: Two times a day (BID) | ORAL | 0 refills | Status: AC

## 2020-12-03 NOTE — Discharge Summary (Addendum)
Physician Discharge Summary  [name] NAT:[id number] DOB: [birth year] DOA: 11/29/2020  PCP: Steele Sizer, MD  Admit date: 11/29/2020 Discharge date: 12/03/2020  Discharge disposition: Home   Recommendations for Outpatient Follow-Up:   Follow-up with PCP in 1 week  Discharge Diagnosis:   Principal Problem:   Severe sepsis with septic shock [facility]) Active Problems:   Acute lower UTI   Syncope    Discharge Condition: Stable.  Diet recommendation:  Diet Order            Diet - low sodium heart healthy           Diet Carb Modified                   Code Status: Prior     Hospital Course:   [name] is a 55 year old woman with multiple medical problems including but not limited to CKD stage IIIa, chronic diastolic CHF, COPD, CAD, type II DM, chronic sinus tachycardia, diabetic neuropathy, GERD, hypertension, dyslipidemia, hypothyroidism, morbid obesity.  She presented to the emergency room because of generalized weakness, dizziness, increased urinary frequency and dysuria.  He was diagnosed with UTI in the emergency room and attempt was made to discharge the patient from the emergency room.  However, patient developed hypotension, lethargy and near syncopal episode in the emergency room.  She was subsequently admitted to the hospital for septic shock secondary to UTI.  She was treated with empiric IV antibiotics, IV fluids and vasopressors (Levophed and midodrine).  Blood culture did not show any growth and urine culture showed multiple species.  Her condition has improved significantly.  She was able to ambulate in the hallway without any problems.  She is deemed stable for discharge to home.     Discharge Exam:    Vitals:   12/02/20 1944 12/03/20 0545 12/03/20 0739 12/03/20 1113  BP: (!) 131/52 92/78 135/62 (!) 141/68  Pulse: 67 93 (!) 101 92  Resp: 18 18 18 18   Temp: 100 F (37.8 C) 97.8 F (36.6 C) (!) 97.5 F (36.4 C) 99.5 F (37.5 C)   TempSrc: Oral Oral Oral Oral  SpO2: 97% 98% 98% (!) 89%  Weight:      Height:         GEN: NAD SKIN: Warm and dry EYES: No pallor or icterus ENT: MMM CV: RRR PULM: CTA B ABD: soft, ND, NT, +BS CNS: AAO x 3, non focal EXT: No edema or tenderness   The results of significant diagnostics from this hospitalization (including imaging, microbiology, ancillary and laboratory) are listed below for reference.     Procedures and Diagnostic Studies:   ECHOCARDIOGRAM COMPLETE  Result Date: 12/01/2020    ECHOCARDIOGRAM REPORT   Patient Name:   [name] Date of Exam: 12/01/2020 Medical Rec #:  [id number]      Height:       59.0 in Accession #:    [id number]     Weight:       200.0 lb Date of Birth:  [birth year]       BSA:          1.844 m Patient Age:    55 years       BP:           129/54 mmHg Patient Gender: F              HR:           90 bpm. Exam Location:  [facility] Procedure:  2D Echo, Color Doppler, Cardiac Doppler and Strain Analysis Indications:     R55 Syncope  History:         Patient has prior history of Echocardiogram examinations, most                  recent 06/08/2020. CHF, CAD, COPD and CKD; Risk                  Factors:Diabetes, Sleep Apnea, Hypertension and Dyslipidemia.  Sonographer:     Charmayne Sheer RDCS (AE) Referring Phys:  [id number] BRITTON L R[place]STER Diagnosing Phys: Kathlyn Sacramento MD  Sonographer Comments: Global longitudinal strain was attempted. IMPRESSIONS  1. Left ventricular ejection fraction, by estimation, is 60 to 65%. The left ventricle has normal function. The left ventricle has no regional wall motion abnormalities. There is mild left ventricular hypertrophy. Left ventricular diastolic parameters were normal. The global longitudinal strain is normal.  2. Right ventricular systolic function is normal. The right ventricular size is normal. Tricuspid regurgitation signal is inadequate for assessing PA pressure.  3. The mitral valve is normal in structure. No evidence of  mitral valve regurgitation. No evidence of mitral stenosis.  4. The aortic valve is normal in structure. Aortic valve regurgitation is not visualized. No aortic stenosis is present. FINDINGS  Left Ventricle: Left ventricular ejection fraction, by estimation, is 60 to 65%. The left ventricle has normal function. The left ventricle has no regional wall motion abnormalities. The global longitudinal strain is normal. The left ventricular internal cavity size was normal in size. There is mild left ventricular hypertrophy. Left ventricular diastolic parameters were normal. Right Ventricle: The right ventricular size is normal. No increase in right ventricular wall thickness. Right ventricular systolic function is normal. Tricuspid regurgitation signal is inadequate for assessing PA pressure. Left Atrium: Left atrial size was normal in size. Right Atrium: Right atrial size was normal in size. Pericardium: There is no evidence of pericardial effusion. Mitral Valve: The mitral valve is normal in structure. No evidence of mitral valve regurgitation. No evidence of mitral valve stenosis. MV peak gradient, 7.5 mmHg. The mean mitral valve gradient is 2.0 mmHg. Tricuspid Valve: The tricuspid valve is normal in structure. Tricuspid valve regurgitation is not demonstrated. No evidence of tricuspid stenosis. Aortic Valve: The aortic valve is normal in structure. Aortic valve regurgitation is not visualized. No aortic stenosis is present. Aortic valve mean gradient measures 4.0 mmHg. Aortic valve peak gradient measures 8.2 mmHg. Aortic valve area, by VTI measures 2.55 cm. Pulmonic Valve: The pulmonic valve was normal in structure. Pulmonic valve regurgitation is not visualized. No evidence of pulmonic stenosis. Aorta: The aortic root is normal in size and structure. Venous: The inferior vena cava was not well visualized. IAS/Shunts: No atrial level shunt detected by color flow Doppler.  LEFT VENTRICLE PLAX 2D LVIDd:         3.90 cm   Diastology LVIDs:         2.80 cm  LV e' medial:    6.64 cm/s LV PW:         1.10 cm  LV E/e' medial:  10.9 LV IVS:        1.00 cm  LV e' lateral:   9.57 cm/s LVOT diam:     2.00 cm  LV E/e' lateral: 7.6 LV SV:         64 LV SV Index:   35 LVOT Area:     3.14 cm  RIGHT VENTRICLE RV Basal diam:  2.70 cm LEFT ATRIUM             Index       RIGHT ATRIUM           Index LA diam:        3.90 cm 2.11 cm/m  RA Area:     10.00 cm LA Vol (A2C):   35.5 ml 19.25 ml/m RA Volume:   20.70 ml  11.22 ml/m LA Vol (A4C):   39.5 ml 21.42 ml/m LA Biplane Vol: 38.0 ml 20.60 ml/m  AORTIC VALVE                   PULMONIC VALVE AV Area (Vmax):    2.55 cm    PV Vmax:       1.15 m/s AV Area (Vmean):   2.45 cm    PV Vmean:      79.800 cm/s AV Area (VTI):     2.55 cm    PV VTI:        0.225 m AV Vmax:           143.00 cm/s PV Peak grad:  5.3 mmHg AV Vmean:          99.300 cm/s PV Mean grad:  3.0 mmHg AV VTI:            0.253 m AV Peak Grad:      8.2 mmHg AV Mean Grad:      4.0 mmHg LVOT Vmax:         116.00 cm/s LVOT Vmean:        77.500 cm/s LVOT VTI:          0.205 m LVOT/AV VTI ratio: 0.81  AORTA Ao Root diam: 2.80 cm MITRAL VALVE MV Area (PHT): 4.71 cm    SHUNTS MV Area VTI:   2.89 cm    Systemic VTI:  0.20 m MV Peak grad:  7.5 mmHg    Systemic Diam: 2.00 cm MV Mean grad:  2.0 mmHg MV Vmax:       1.37 m/s MV Vmean:      72.7 cm/s MV Decel Time: 161 msec MV E velocity: 72.40 cm/s MV A velocity: 89.50 cm/s MV E/A ratio:  0.81 Kathlyn Sacramento MD Electronically signed by Kathlyn Sacramento MD Signature Date/Time: 12/01/2020/1:10:21 PM    Final      Labs:   Basic Metabolic Panel: Recent Labs  Lab 11/29/20 2225 11/30/20 0328 12/01/20 0511 12/02/20 0607 12/03/20 0634  NA 139 138 139 141 139  K 4.2 4.0 4.5 4.1 4.3  CL 104 106 109 106 105  CO2 26 24 18* 23 24  GLUCOSE 87 156* 179* 110* 138*  BUN 28* 27* 17 14 14   CREATININE 1.49* 1.31* 1.24* 0.93 0.96  CALCIUM 9.8 8.9 8.9 9.4 9.5  MG  --   --  1.6* 1.8  --   PHOS  --    --  4.4  --   --    GFR Estimated Creatinine Clearance: 63.5 mL/min (by C-G formula based on SCr of 0.96 mg/dL). Liver Function Tests: Recent Labs  Lab 11/29/20 2225 11/30/20 0328  AST 29 23  ALT 26 23  ALKPHOS 43 40  BILITOT 0.5 0.5  PROT 6.4* 5.6*  ALBUMIN 3.8 3.3*   No results for input(s): LIPASE, AMYLASE in the last 168 hours. No results for input(s): AMMONIA in the last 168 hours. Coagulation profile Recent Labs  Lab 11/30/20 0328  INR 1.1    CBC:  Recent Labs  Lab 11/29/20 1921 11/30/20 0328 12/01/20 0511 12/02/20 0607  WBC 9.8 8.4 6.4 6.6  NEUTROABS 4.8 5.0  --   --   HGB 12.8 11.3* 10.4* 10.5*  HCT 41.7 35.1* 33.7* 32.4*  MCV 98.8 94.4 96.6 94.2  PLT 315 232 242 233   Cardiac Enzymes: No results for input(s): CKTOTAL, CKMB, CKMBINDEX, TROPONINI in the last 168 hours. BNP: Invalid input(s): POCBNP CBG: Recent Labs  Lab 12/02/20 1122 12/02/20 1631 12/02/20 2105 12/03/20 0736 12/03/20 1110  GLUCAP 143* 152* 125* 144* 263*   D-Dimer No results for input(s): DDIMER in the last 72 hours. Hgb A1c No results for input(s): HGBA1C in the last 72 hours. Lipid Profile No results for input(s): CHOL, HDL, LDLCALC, TRIG, CHOLHDL, LDLDIRECT in the last 72 hours. Thyroid function studies No results for input(s): TSH, T4TOTAL, T3FREE, THYROIDAB in the last 72 hours.  Invalid input(s): FREET3 Anemia work up No results for input(s): VITAMINB12, FOLATE, FERRITIN, TIBC, IRON, RETICCTPCT in the last 72 hours. Microbiology Recent Results (from the past 240 hour(s))  Urine culture     Status: Abnormal   Collection Time: 11/29/20  7:15 PM   Specimen: Urine, Catheterized  Result Value Ref Range Status   Specimen Description   Final    URINE, CATHETERIZED Performed at [facility], [address] [place], [place] [address]    Special Requests   Final    NONE Performed at [facility], [address] [place], [place] [address]    Culture  MULTIPLE SPECIES PRESENT, SUGGEST RECOLLECTION (A)  Final   Report Status 12/01/2020 FINAL  Final  Culture, blood (Routine X 2) w Reflex to ID Panel     Status: None (Preliminary result)   Collection Time: 11/29/20 10:25 PM   Specimen: BLOOD  Result Value Ref Range Status   Specimen Description BLOOD FOREARM  Final   Special Requests   Final    BOTTLES DRAWN AEROBIC AND ANAEROBIC Blood Culture adequate volume   Culture   Final    NO GROWTH 4 DAYS Performed at [facility], [address] [place], [place] [address]    Report Status PENDING  Incomplete  Resp Panel by RT-PCR (Flu A&B, Covid) Nasopharyngeal Swab     Status: None   Collection Time: 11/29/20 11:00 PM   Specimen: Nasopharyngeal Swab; Nasopharyngeal(NP) swabs in vial transport medium  Result Value Ref Range Status   SARS Coronavirus 2 by RT PCR NEGATIVE NEGATIVE Final    Comment: (NOTE) SARS-CoV-2 target nucleic acids are NOT DETECTED.  The SARS-CoV-2 RNA is generally detectable in upper respiratory specimens during the acute phase of infection. The lowest concentration of SARS-CoV-2 viral copies this assay can detect is 138 copies/mL. A negative result does not preclude SARS-Cov-2 infection and should not be used as the sole basis for treatment or other patient management decisions. A negative result may occur with  improper specimen collection/handling, submission of specimen other than nasopharyngeal swab, presence of viral mutation(s) within the areas targeted by this assay, and inadequate number of viral copies(<138 copies/mL). A negative result must be combined with clinical observations, patient history, and epidemiological information. The expected result is Negative.  Fact Sheet for Patients:  EntrepreneurPulse.com.au  Fact Sheet for Healthcare Providers:  IncredibleEmployment.be  This test is no t yet approved or cleared by the [place] FDA and  has been  authorized for detection and/or diagnosis of SARS-CoV-2 by FDA under an Emergency Use Authorization (EUA). This EUA  will remain  in effect (meaning this test can be used) for the duration of the COVID-19 declaration under Section 564(b)(1) of the Act, 21 U.S.C.section 360bbb-3(b)(1), unless the authorization is terminated  or revoked sooner.       Influenza A by PCR NEGATIVE NEGATIVE Final   Influenza B by PCR NEGATIVE NEGATIVE Final    Comment: (NOTE) The Xpert Xpress SARS-CoV-2/FLU/RSV plus assay is intended as an aid in the diagnosis of influenza from Nasopharyngeal swab specimens and should not be used as a sole basis for treatment. Nasal washings and aspirates are unacceptable for Xpert Xpress SARS-CoV-2/FLU/RSV testing.  Fact Sheet for Patients: EntrepreneurPulse.com.au  Fact Sheet for Healthcare Providers: IncredibleEmployment.be  This test is not yet approved or cleared by the [place] FDA and has been authorized for detection and/or diagnosis of SARS-CoV-2 by FDA under an Emergency Use Authorization (EUA). This EUA will remain in effect (meaning this test can be used) for the duration of the COVID-19 declaration under Section 564(b)(1) of the Act, 21 U.S.C. section 360bbb-3(b)(1), unless the authorization is terminated or revoked.  Performed at [facility], [address] [place], [place] [address]   Culture, blood (x 2)     Status: None (Preliminary result)   Collection Time: 11/30/20  3:28 AM   Specimen: BLOOD  Result Value Ref Range Status   Specimen Description BLOOD BLOOD LEFT HAND  Final   Special Requests   Final    BOTTLES DRAWN AEROBIC AND ANAEROBIC Blood Culture adequate volume   Culture   Final    NO GROWTH 3 DAYS Performed at [facility], [address] [place], [place] [address]    Report Status PENDING  Incomplete  MRSA PCR Screening     Status: None   Collection Time: 11/30/20 12:05  PM   Specimen: Nasal Mucosa; Nasopharyngeal  Result Value Ref Range Status   MRSA by PCR NEGATIVE NEGATIVE Final    Comment:        The GeneXpert MRSA Assay (FDA approved for NASAL specimens only), is one component of a comprehensive MRSA colonization surveillance program. It is not intended to diagnose MRSA infection nor to guide or monitor treatment for MRSA infections. Performed at [facility], [address] [place], [place] [address]      Discharge Instructions:   Discharge Instructions    Diet - low sodium heart healthy   Complete by: As directed    Diet Carb Modified   Complete by: As directed    Increase activity slowly   Complete by: As directed      Allergies as of 12/03/2020      Reactions   Mirtazapine Anaphylaxis   REACTION: closes throat   Morphine And Related    anaphylaxis   Sulfamethoxazole-trimethoprim Rash   REACTION: Whelps all over   Vicodin Hp [hydrocodone-acetaminophen]    Amoxicillin Er Nausea And Vomiting   Pt doesn't remember problem with amoxicillin -  SE Has patient had a PCN reaction causing immediate rash, facial/tongue/throat swelling, SOB or lightheadedness with hypotension: No Has patient had a PCN reaction causing severe rash involving mucus membranes or skin necrosis: No Has patient had a PCN reaction that required hospitalization: No Has patient had a PCN reaction occurring within the last 10 years: Yes If all of the above answers are "NO", then may proceed with Cephalosporin use.   Codeine Itching   REACTION: itch, rash   Hydrocodone-acetaminophen Rash   REACTION: rash   Keflex [cephalexin] Rash  Pt does not remember having a reaction or rash, no airway involvement   Prasterone Nausea And Vomiting      Medication List    STOP taking these medications   ipratropium 0.03 % nasal spray Commonly known as: ATROVENT     TAKE these medications   atorvastatin 80 MG tablet Commonly known as: LIPITOR Take 80 mg by  mouth at bedtime.   busPIRone 30 MG tablet Commonly known as: BUSPAR Take 30 mg by mouth 2 (two) times daily. What changed: Another medication with the same name was removed. Continue taking this medication, and follow the directions you see here.   cefdinir 300 MG capsule Commonly known as: OMNICEF Take 1 capsule (300 mg total) by mouth 2 (two) times daily for 3 days.   citalopram 20 MG tablet Commonly known as: CELEXA Take 20 mg by mouth daily.   dexlansoprazole 60 MG capsule Commonly known as: Dexilant Take 1 capsule (60 mg total) by mouth daily.   ezetimibe 10 MG tablet Commonly known as: ZETIA Take 10 mg by mouth at bedtime.   fenofibrate micronized 134 MG capsule Commonly known as: LOFIBRA Take 134 mg by mouth at bedtime.   gabapentin 300 MG capsule Commonly known as: NEURONTIN Take 300 mg by mouth 2 (two) times daily.   gabapentin 600 MG tablet Commonly known as: NEURONTIN Take 600 mg by mouth at bedtime.   Glyxambi 25-5 MG Tabs Generic drug: Empagliflozin-linaGLIPtin Take 1 tablet by mouth daily.   HUMULIN R 500 UNIT/ML injection Generic drug: insulin regular human CONCENTRATED Inject 70 Units into the skin 3 (three) times daily with meals.   lamoTRIgine 200 MG tablet Commonly known as: LAMICTAL Take 200 mg by mouth 2 (two) times daily.   lisinopril 2.5 MG tablet Commonly known as: ZESTRIL Take 1 tablet (2.5 mg total) by mouth daily.   metoprolol tartrate 25 MG tablet Commonly known as: LOPRESSOR Take 0.5 tablets (12.5 mg total) by mouth 2 (two) times daily.   nitroGLYCERIN 0.4 MG SL tablet Commonly known as: NITROSTAT Place 1 tablet (0.4 mg total) under the tongue every 5 (five) minutes as needed for chest pain.   NovoLOG FlexPen 100 UNIT/ML FlexPen Generic drug: insulin aspart Inject 3 Units subcutaneously. Sliding scale.   OneTouch Delica Lancets 47Q Misc by Does not apply route.   promethazine 12.5 MG tablet Commonly known as:  PHENERGAN Take 1 tablet (12.5 mg total) by mouth every 6 (six) hours as needed for nausea or vomiting.   QUEtiapine 200 MG tablet Commonly known as: SEROQUEL Take 200 mg by mouth at bedtime. Along with 348m dose for a total of 5012m  QUEtiapine 300 MG tablet Commonly known as: SEROQUEL Take 300 mg by mouth at bedtime. Along with 20021mose for a total of 500m36mSynthroid 100 MCG tablet Generic drug: levothyroxine Take 100 mcg by mouth daily.   Trulicity 1.5 MG/0QV/9.5GLn Generic drug: Dulaglutide Inject 1.5 mg into the skin once a week. (Wednesdays)       Follow-up Information    SowlSteele Sizer On 01/04/2021.   Specialty: Family Medicine Why: If symptoms worsen... @ 2:40pm Contact information: [id number] [phone]            Time coordinating discharge: 27 minutes  Signed:  BERNJennye [facility]siad [facility] 12/03/2020, 9:21 PM   Pager on [email or web address] 7PM-7AM, please contact night-coverage at [email or web address]

## 2020-12-03 NOTE — Progress Notes (Signed)
Pharmacy Antibiotic Note  [name] is a 55 y.o. female admitted on 11/29/2020 with sepsis and UTI.  Pharmacy has been consulted for Cefepime dosing.  Pt has cephalexin listed as allergy with rash as rxn, pt does not remember rash or airway involvement.  Will continue with Cefepime.   Plan: Cefepime 2 gm IV Q8H. Plan to discharge patient on cefdinir x 3 more days.   Height: 4' 11"  (149.9 cm) Weight: 90.7 kg (200 lb) IBW/kg (Calculated) : 43.2  Temp (24hrs), Avg:98.7 F (37.1 C), Min:97.5 F (36.4 C), Max:100 F (37.8 C)  Recent Labs  Lab 11/29/20 1921 11/29/20 2225 11/30/20 0328 11/30/20 1010 12/01/20 0511 12/02/20 0607 12/03/20 0634  WBC 9.8  --  8.4  --  6.4 6.6  --   CREATININE  --  1.49* 1.31*  --  1.24* 0.93 0.96  LATICACIDVEN  --  1.9 1.1 1.2  --   --   --     Estimated Creatinine Clearance: 63.5 mL/min (by C-G formula based on SCr of 0.96 mg/dL).    Allergies  Allergen Reactions  . Mirtazapine Anaphylaxis    REACTION: closes throat  . Morphine And Related     anaphylaxis  . Sulfamethoxazole-Trimethoprim Rash    REACTION: Whelps all over  . Vicodin Hp [Hydrocodone-Acetaminophen]   . Amoxicillin Er Nausea And Vomiting    Pt doesn't remember problem with amoxicillin -  SE Has patient had a PCN reaction causing immediate rash, facial/tongue/throat swelling, SOB or lightheadedness with hypotension: No Has patient had a PCN reaction causing severe rash involving mucus membranes or skin necrosis: No Has patient had a PCN reaction that required hospitalization: No Has patient had a PCN reaction occurring within the last 10 years: Yes If all of the above answers are "NO", then may proceed with Cephalosporin use.  . Codeine Itching    REACTION: itch, rash  . Hydrocodone-Acetaminophen Rash    REACTION: rash  . Keflex [Cephalexin] Rash    Pt does not remember having a reaction or rash, no airway involvement  . Prasterone Nausea And Vomiting     Thank you for  allowing pharmacy to be a part of this patient's care.  Oswald Hillock 12/03/2020 12:51 PM

## 2020-12-03 NOTE — Care Management Important Message (Signed)
Important Message  Patient Details  Name: [name] MRN: [id number] Date of Birth: 1966-01-21   Medicare Important Message Given:  Yes     Dannette Barbara 12/03/2020, 1:54 PM

## 2020-12-03 NOTE — Progress Notes (Signed)
SATURATION QUALIFICATIONS: (This note is used to comply with regulatory documentation for home oxygen)  Patient Saturations on Room Air at Rest = 97%  Patient Saturations on Room Air while Ambulating = 92%  Patient tolerated ambulation well  Denise SOB, dizziness, and chest pain

## 2020-12-04 LAB — CULTURE, BLOOD (ROUTINE X 2)
Culture: NO GROWTH
Special Requests: ADEQUATE

## 2020-12-05 LAB — CULTURE, BLOOD (ROUTINE X 2)
Culture: NO GROWTH
Special Requests: ADEQUATE

## 2020-12-06 ENCOUNTER — Other Ambulatory Visit: Payer: Self-pay

## 2020-12-06 ENCOUNTER — Telehealth: Payer: Self-pay

## 2020-12-06 DIAGNOSIS — I5032 Chronic diastolic (congestive) heart failure: Secondary | ICD-10-CM

## 2020-12-06 NOTE — Telephone Encounter (Signed)
Transition Care Management Follow-up Telephone Call  Date of discharge and from where: 12/03/20 [facility]  How have you been since you were released from the hospital? Pt states doing okay. Pt c/o headaches approx once weekly; pt states she was given imitrex while in the hospital and it worked well and would like to discuss with Dr. Ancil Boozer  Any questions or concerns? No  Items Reviewed:  Did the pt receive and understand the discharge instructions provided? Yes   Medications obtained and verified? Yes   Other? No   Any new allergies since your discharge? No   Dietary orders reviewed? Yes  Do you have support at home? Yes   Home Care and Equipment/Supplies: Were home health services ordered? no Were any new equipment or medical supplies ordered?  No   Functional Questionnaire: (I = Independent and D = Dependent) ADLs: I  Bathing/Dressing- I  Meal Prep- I  Eating- I  Maintaining continence- I  Transferring/Ambulation- I  Managing Meds- I  Follow up appointments reviewed:   PCP Hospital f/u appt confirmed? Yes  Scheduled to see Dr. Ancil Boozer on 01/04/21  @ 2:40.  Are transportation arrangements needed? No   If their condition worsens, is the pt aware to call PCP or go to the Emergency Dept.? Yes  Was the patient provided with contact information for the PCP's office or ED? Yes  Was to pt encouraged to call back with questions or concerns? Yes

## 2020-12-07 ENCOUNTER — Telehealth: Payer: Self-pay | Admitting: *Deleted

## 2020-12-07 NOTE — Chronic Care Management (AMB) (Signed)
  Chronic Care Management   Note  12/07/2020 Name: [name] MRN: [id number] DOB: [birth year]  [name] is a 55 y.o. year old female who is a primary care patient of Steele Sizer, MD. I reached out to [employer] by phone today in response to a referral sent by [name]'s PCP, Dr. Ancil Boozer.     [name] was given information about Chronic Care Management services today including:  1. CCM service includes personalized support from designated clinical staff supervised by her physician, including individualized plan of care and coordination with other care providers 2. 24/7 contact phone numbers for assistance for urgent and routine care needs. 3. Service will only be billed when office clinical staff spend 20 minutes or more in a month to coordinate care. 4. Only one practitioner may furnish and bill the service in a calendar month. 5. The patient may stop CCM services at any time (effective at the end of the month) by phone call to the office staff. 6. The patient will be responsible for cost sharing (co-pay) of up to 20% of the service fee (after annual deductible is met).  Patient agreed to services and verbal consent obtained.   Follow up plan: Telephone appointment with care management team member scheduled for:12/30/2020  Rowena Moilanen  Care Guide, Embedded Care Coordination [place]  Care Management

## 2020-12-08 ENCOUNTER — Other Ambulatory Visit: Payer: Self-pay | Admitting: Nephrology

## 2020-12-08 DIAGNOSIS — E1122 Type 2 diabetes mellitus with diabetic chronic kidney disease: Secondary | ICD-10-CM | POA: Diagnosis not present

## 2020-12-08 DIAGNOSIS — R8290 Unspecified abnormal findings in urine: Secondary | ICD-10-CM

## 2020-12-08 DIAGNOSIS — I10 Essential (primary) hypertension: Secondary | ICD-10-CM | POA: Diagnosis not present

## 2020-12-08 DIAGNOSIS — I509 Heart failure, unspecified: Secondary | ICD-10-CM | POA: Diagnosis not present

## 2020-12-10 ENCOUNTER — Ambulatory Visit: Payer: Medicare Other

## 2020-12-13 DIAGNOSIS — M5416 Radiculopathy, lumbar region: Secondary | ICD-10-CM | POA: Diagnosis not present

## 2020-12-13 DIAGNOSIS — M533 Sacrococcygeal disorders, not elsewhere classified: Secondary | ICD-10-CM | POA: Diagnosis not present

## 2020-12-16 ENCOUNTER — Inpatient Hospital Stay: Payer: Medicare Other | Admitting: Family Medicine

## 2020-12-23 ENCOUNTER — Ambulatory Visit: Payer: Medicare Other

## 2020-12-24 ENCOUNTER — Telehealth: Payer: Self-pay

## 2020-12-24 NOTE — Progress Notes (Signed)
Name: [name]   MRN: [id number]    DOB: [birth year]   Date:12/27/2020       Progress Note  [facility] Follow Up  HPI  Admitted 11/29/2020 Discharged 12/03/20   [name] went to [facility] because she was feeling weak, while at the [facility] she became hypotensive and had a near syncopal episode. She was admitted with diagnosis of septic shock secondary to UTI. She was treated with IV antibiotics, fluids and vasopressors. Her symptoms improved and she was able to be sent home on 12/03/20 . She had drop of GFR during her stay and was seen by nephrologist after discharge. Dr. Lanora Manis advised her to continue taking ACE for cardio renal protection, bp during that visit was 135/80 .She has noticed dysuria and some supra pubic pressure since she has been home, she states she is scared of having another one. She appetite has been good, no fatigue or dizziness. She states still needs to use a cane ( since COVID-19 - March) but getting stronger. Sometimes feels dizzy and sleepy - advised to check glucose when it happens and bp She denies vaginal discharge or itching. She sees Dr. Ronnald Collum for DM and has an upcoming appointment   DM: not controlled lately, glucose was over 300 this am, not controlled since she took steroids at hospital.   Migraine headaches: she states took Imitrex in the past and would like a refill, episodes are seldom but intense. Usually starts frontal area and radiates to nuchal area, described as throbbing and intense and associated with nausea and vomiting. Associated with photophobia and phonophobia   Medication reconciliation was done with patient today   Patient Active Problem List   Diagnosis Date Noted  . Severe sepsis with septic shock ([place]) 11/30/2020  . Syncope 11/29/2020  . Chronic congestive heart failure ([place]) 11/04/2020  . Iron deficiency anemia due to chronic blood loss   . Polyp of ascending colon   . Chronic gastric ulcer with  hemorrhage   . Vitamin B12 deficiency 08/08/2018  . Patient is full code 10/30/2017  . Coronary artery disease 10/09/2017  . Plantar fascial fibromatosis 10/09/2017  . Tendinitis of wrist 10/09/2017  . Schizoaffective disorder, bipolar type ([place]) 09/28/2017  . 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 Breast cancer screening 09/28/2016  . Acute lower UTI 03/14/2016  . Adenomatous colon polyp 10/15/2015  . Bipolar affective disorder ([place]) 10/15/2015  . CN (constipation) 10/15/2015  . Drug-induced hepatic toxicity 10/15/2015  . HLD (hyperlipidemia) 10/15/2015  . Hypoxemia 10/15/2015  . NASH (nonalcoholic steatohepatitis) 10/15/2015  . Morbid obesity ([place]) 10/15/2015  . Restless leg syndrome 10/15/2015  . Type 2 diabetes, uncontrolled, with neuropathy ([place]) 10/15/2015  . Elevated alkaline phosphatase level 09/22/2015  . Low back pain with left-sided sciatica 08/18/2015  . Apnea, sleep 05/05/2015  . Heart valve disease 05/05/2015  . Chronic kidney disease, stage III (moderate) (HCC) 03/15/2015  . Migraine 03/12/2015  . COPD (chronic obstructive pulmonary disease) ([place]) 01/20/2015  . 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[place]) 01/20/2015  . GERD (gastroesophageal reflux disease) 01/20/2015  . Hypothyroid   . Vitamin D deficiency   . Urinary incontinence   . Liver lesion   . Hypomagnesemia   . Obstructive sleep apnea 10/20/2013  . PERIPHERAL NEUROPATHY 03/04/2009  . INTERSTITIAL CYSTITIS 07/04/2007  . Allergic rhinitis 07/04/2007    Past Surgical History:  Procedure Laterality Date  . ABDOMINAL HYSTERECTOMY     Partial  .  bladder stim  2007  . BLADDER SURGERY     x 2. tacking of bladder and the other was the stimulator placement  . CHOLECYSTECTOMY    . COLONOSCOPY WITH PROPOFOL N/A 03/04/2019   Procedure: COLONOSCOPY WITH PROPOFOL;  Surgeon: [name], MD;  [facility] END[facility]COPY;  Service: Endoscopy;  Laterality: N/A;  . ESOPHAGOGASTRODUODENOSCOPY (EGD) WITH  PROPOFOL N/A 03/04/2019   Procedure: ESOPHAGOGASTRODUODENOSCOPY (EGD) WITH PROPOFOL;  Surgeon: [name], MD;  Location: [facility] ENDOSCOPY;  Service: Endoscopy;  Laterality: N/A;  . INCISION AND DRAINAGE ABSCESS Right 04/16/2019   Procedure: INCISION AND DRAINAGE ABSCESS;  Surgeon: Benjamine Sprague, DO;  Location: [facility] ORS;  Service: General;  Laterality: Right;  . INTERSTIM-GENERATOR CHANGE N/A 12/31/2017   Procedure: INTERSTIM-GENERATOR CHANGE;  Surgeon: Bjorn Loser, MD;  Location: [facility] ORS;  Service: Urology;  Laterality: N/A;  . LIVER BIOPSY  March 2016   Negative  . LUNG BIOPSY  2015   small spot on lung  . TUBAL LIGATION    . UPPER GI ENDOSCOPY    . WRIST SURGERY Right 11/02/2019    Family History  Problem Relation Age of Onset  . Diabetes Mother   . Heart disease Mother   . Hyperlipidemia Mother   . Hypertension Mother   . Kidney disease Mother   . Mental illness Mother   . Hypothyroidism Mother   . Stroke Mother   . Osteoporosis Mother   . Glaucoma Mother   . Congestive Heart Failure Mother   . Hypertension Father   . Asthma Daughter   . Cancer Daughter        throat  . Bipolar disorder Daughter   . Heart disease Maternal Grandfather   . Rheum arthritis Maternal Grandfather   . Cancer Maternal Grandfather        liver  . Kidney disease Maternal Grandfather   . Cancer Paternal Grandmother        lung  . Kidney disease Brother   . Breast cancer Maternal Grandmother 60  . Bipolar disorder Daughter   . Hypertension Daughter   . Restless legs syndrome Daughter     Social History   Tobacco Use  . Smoking status: Never Smoker  . Smokeless tobacco: Never Used  Substance Use Topics  . Alcohol use: No     Current Outpatient Medications:  .  atorvastatin (LIPITOR) 80 MG tablet, Take 80 mg by mouth at bedtime. , Disp: , Rfl:  .  busPIRone (BUSPAR) 30 MG tablet, Take 30 mg by mouth 2 (two) times daily., Disp: , Rfl:  .  citalopram (CELEXA) 20 MG tablet, Take 20  mg by mouth daily., Disp: , Rfl:  .  dexlansoprazole (DEXILANT) 60 MG capsule, Take 1 capsule (60 mg total) by mouth daily., Disp: 30 capsule, Rfl: 6 .  ezetimibe (ZETIA) 10 MG tablet, Take 10 mg by mouth at bedtime., Disp: , Rfl:  .  fenofibrate micronized (LOFIBRA) 134 MG capsule, Take 134 mg by mouth at bedtime., Disp: , Rfl:  .  gabapentin (NEURONTIN) 300 MG capsule, Take 300 mg by mouth 2 (two) times daily., Disp: , Rfl:  .  gabapentin (NEURONTIN) 600 MG tablet, Take 600 mg by mouth at bedtime., Disp: , Rfl:  .  GLYXAMBI 25-5 MG TABS, Take 1 tablet by mouth daily., Disp: , Rfl:  .  insulin aspart (NOVOLOG FLEXPEN) 100 UNIT/ML FlexPen, Inject 3 Units subcutaneously. Sliding scale., Disp: , Rfl:  .  insulin regular human CONCENTRATED (HUMULIN R) 500 UNIT/ML injection, Inject  70 Units into the skin 3 (three) times daily with meals., Disp: , Rfl:  .  lamoTRIgine (LAMICTAL) 200 MG tablet, Take 200 mg by mouth 2 (two) times daily. , Disp: , Rfl:  .  lisinopril (ZESTRIL) 2.5 MG tablet, Take 1 tablet (2.5 mg total) by mouth daily., Disp: 90 tablet, Rfl: 2 .  nitroGLYCERIN (NITROSTAT) 0.4 MG SL tablet, Place 1 tablet (0.4 mg total) under the tongue every 5 (five) minutes as needed for chest pain., Disp: 25 tablet, Rfl: 0 .  OneTouch Delica Lancets 82U MISC, by Does not apply route., Disp: , Rfl:  .  promethazine (PHENERGAN) 12.5 MG tablet, Take 1 tablet (12.5 mg total) by mouth every 6 (six) hours as needed for nausea or vomiting., Disp: 30 tablet, Rfl: 1 .  QUEtiapine (SEROQUEL) 200 MG tablet, Take 200 mg by mouth at bedtime. Along with 365m dose for a total of 5060m Disp: , Rfl:  .  QUEtiapine (SEROQUEL) 300 MG tablet, Take 300 mg by mouth at bedtime. Along with 20015mose for a total of 500m62misp: , Rfl:  .  SYNTHROID 100 MCG tablet, Take 100 mcg by mouth daily., Disp: , Rfl:  .  TRULICITY 1.5 MG/0MP/5.3IRN, Inject 1.5 mg into the skin once a week. (Wednesdays), Disp: , Rfl:  .  methocarbamol  (ROBAXIN) 750 MG tablet, methocarbamol 750 mg tablet  Take 1 tablet every 8 hours by oral route., Disp: , Rfl:  .  metoprolol tartrate (LOPRESSOR) 25 MG tablet, Take 0.5 tablets (12.5 mg total) by mouth 2 (two) times daily., Disp: 30 tablet, Rfl: 5  Allergies  Allergen Reactions  . Mirtazapine Anaphylaxis    REACTION: closes throat  . Morphine And Related     anaphylaxis  . Sulfamethoxazole-Trimethoprim Rash    REACTION: [place] all over  . Vicodin Hp [Hydrocodone-Acetaminophen]   . Amoxicillin Er Nausea And Vomiting    Pt doesn't remember problem with amoxicillin -  SE Has patient had a PCN reaction causing immediate rash, facial/tongue/throat swelling, SOB or lightheadedness with hypotension: No Has patient had a PCN reaction causing severe rash involving mucus membranes or skin necrosis: No Has patient had a PCN reaction that required hospitalization: No Has patient had a PCN reaction occurring within the last 10 years: Yes If all of the above answers are "NO", then may proceed with Cephalosporin use.  . Codeine Itching    REACTION: itch, rash  . Hydrocodone-Acetaminophen Rash    REACTION: rash  . Keflex [Cephalexin] Rash    Pt does not remember having a reaction or rash, no airway involvement  . Prasterone Nausea And Vomiting    I personally reviewed active problem list, medication list, allergies, family history, social history, health maintenance with the patient/caregiver today.   ROS  Constitutional: Negative for fever or weight change.  Respiratory: Negative for cough and shortness of breath.   Cardiovascular: Negative for chest pain or palpitations.  Gastrointestinal: Negative for abdominal pain, no bowel changes.  Musculoskeletal: Negative for gait problem or joint swelling.  Skin: Negative for rash.  Neurological: positive for intermittent  dizziness and  headache.  No other specific complaints in a complete review of systems (except as listed in HPI  above).  Objective  Vitals:   12/27/20 1022  BP: 122/78  Pulse: 82  Resp: 16  Temp: 98.2 F (36.8 C)  TempSrc: Oral  SpO2: 98%  Weight: 195 lb (88.5 kg)  Height: 4' 11"  (1.499 m)    Body mass index  is 39.39 kg/m.  Physical Exam  Constitutional: Patient appears well-developed and well-nourished. Obese  No distress.  HEENT: head atraumatic, normocephalic, pupils equal and reactive to light, , neck supple Cardiovascular: Normal rate, regular rhythm and normal heart sounds.  No murmur heard. No BLE edema. Pulmonary/Chest: Effort normal and breath sounds normal. No respiratory distress. Abdominal: Soft.  There is no tenderness. Psychiatric: Patient has a normal mood and affect. behavior is normal. Judgment and thought content normal.  Recent Results (from the past 2160 hour(s))  Iron, TIBC and Ferritin Panel     Status: None   Collection Time: 10/26/20 12:00 AM  Result Value Ref Range   Ferritin 89   CBC and differential     Status: Abnormal   Collection Time: 10/26/20 12:00 AM  Result Value Ref Range   Hemoglobin 11.8 (A) 12.0 - 16.0   HCT 37 36 - 46   Platelets 264 150 - 399   WBC 8.2   CBC     Status: None   Collection Time: 10/26/20 12:00 AM  Result Value Ref Range   RBC 3.96 3.87 - 6.56  Basic metabolic panel     Status: Abnormal   Collection Time: 10/26/20 12:00 AM  Result Value Ref Range   Glucose 188    BUN 29 (A) 4 - 21   Creatinine 1.3 (A) 0.5 - 1.1   Potassium 4.9 3.4 - 5.3   Sodium 139 137 - 147   Chloride 102 99 - 108  Comprehensive metabolic panel     Status: None   Collection Time: 10/26/20 12:00 AM  Result Value Ref Range   GFR calc Af Amer 55    GFR calc non Af Amer 48   Lipid panel     Status: Abnormal   Collection Time: 10/26/20 12:00 AM  Result Value Ref Range   Triglycerides 226 (A) 40 - 160   HDL 27 (A) 35 - 70   LDL Cholesterol 37   Hepatic function panel     Status: None   Collection Time: 10/26/20 12:00 AM  Result Value Ref Range    Alkaline Phosphatase 50 25 - 125   ALT 18 7 - 35   AST 23 13 - 35   Bilirubin, Total 0.2   Hemoglobin A1c     Status: None   Collection Time: 10/26/20 12:00 AM  Result Value Ref Range   Hemoglobin A1C 8.7   TSH     Status: Abnormal   Collection Time: 10/26/20 12:00 AM  Result Value Ref Range   TSH 0.25 (A) 0.41 - 5.90  NM Myocar Multi W/Spect W/Wall Motion / EF     Status: None   Collection Time: 10/29/20 10:48 AM  Result Value Ref Range   Rest HR 87 bpm   Rest BP 119/74 mmHg   Percent HR 61 %   Peak HR 100 bpm   Peak BP 124/71 mmHg   MPHR 163 bpm   SSS 5    SRS 1    SDS 1    TID 0.89    LV sys vol 22 mL   LV dias vol 58 46 - 106 mL  Urinalysis, Complete w Microscopic Urine, Clean Catch     Status: Abnormal   Collection Time: 11/29/20  7:15 PM  Result Value Ref Range   Color, Urine AMBER (A) YELLOW    Comment: BIOCHEMICALS MAY BE AFFECTED BY COLOR   APPearance CLEAR (A) CLEAR   Specific Gravity, Urine 1.008  1.005 - 1.030   pH 6.0 5.0 - 8.0   Glucose, UA >=500 (A) NEGATIVE mg/dL   Hgb urine dipstick NEGATIVE NEGATIVE   Bilirubin Urine NEGATIVE NEGATIVE   Ketones, ur NEGATIVE NEGATIVE mg/dL   Protein, ur NEGATIVE NEGATIVE mg/dL   Nitrite POSITIVE (A) NEGATIVE   Leukocytes,Ua NEGATIVE NEGATIVE   RBC / HPF 0-5 0 - 5 RBC/hpf   WBC, UA 0-5 0 - 5 WBC/hpf   Bacteria, UA RARE (A) NONE SEEN   Squamous Epithelial / LPF 0-5 0 - 5    Comment: Performed at [facility], [address], [place], [place] [address]  Urine Drug Screen, Qualitative ([facility] only)     Status: Abnormal   Collection Time: 11/29/20  7:15 PM  Result Value Ref Range   Tricyclic, Ur Screen POSITIVE (A) NONE DETECTED   Amphetamines, Ur Screen NONE DETECTED NONE DETECTED   MDMA (Ecstasy)Ur Screen NONE DETECTED NONE DETECTED   Cocaine Metabolite,Ur [place] NONE DETECTED NONE DETECTED   Opiate, Ur Screen NONE DETECTED NONE DETECTED   Phencyclidine (PCP) Ur S NONE DETECTED NONE DETECTED   Cannabinoid 50  Ng, Ur [place] NONE DETECTED NONE DETECTED   Barbiturates, Ur Screen NONE DETECTED NONE DETECTED   Benzodiazepine, Ur Scrn NONE DETECTED NONE DETECTED   Methadone Scn, Ur NONE DETECTED NONE DETECTED    Comment: (NOTE) Tricyclics + metabolites, urine    Cutoff 1000 ng/mL Amphetamines + metabolites, urine  Cutoff 1000 ng/mL MDMA (Ecstasy), urine              Cutoff 500 ng/mL Cocaine Metabolite, urine          Cutoff 300 ng/mL Opiate + metabolites, urine        Cutoff 300 ng/mL Phencyclidine (PCP), urine         Cutoff 25 ng/mL Cannabinoid, urine                 Cutoff 50 ng/mL Barbiturates + metabolites, urine  Cutoff 200 ng/mL Benzodiazepine, urine              Cutoff 200 ng/mL Methadone, urine                   Cutoff 300 ng/mL  The urine drug screen provides only a preliminary, unconfirmed analytical test result and should not be used for non-medical purposes. Clinical consideration and professional judgment should be applied to any positive drug screen result due to possible interfering substances. A more specific alternate chemical method must be used in order to obtain a confirmed analytical result. Gas chromatography / mass spectrometry (GC/MS) is the preferred confirm atory method. Performed at [facility], [address], [place], [place] [address]   Urine culture     Status: Abnormal   Collection Time: 11/29/20  7:15 PM   Specimen: Urine, Catheterized  Result Value Ref Range   Specimen Description      URINE, CATHETERIZED Performed at [facility], [address] [place], [place] [address]    Special Requests      NONE Performed at [facility], [address] [place], [place] [address]    Culture MULTIPLE SPECIES PRESENT, SUGGEST RECOLLECTION (A)    Report Status 12/01/2020 FINAL   CBG monitoring, ED     Status: None   Collection Time: 11/29/20  7:18 PM  Result Value Ref Range   Glucose-Capillary 95 70 - 99 mg/dL    Comment: Glucose  reference range applies only to samples  taken after fasting for at least 8 hours.  POC urine preg, ED     Status: None   Collection Time: 11/29/20  7:19 PM  Result Value Ref Range   Preg Test, Ur NEGATIVE NEGATIVE    Comment:        THE SENSITIVITY OF THIS METHODOLOGY IS >24 mIU/mL   CBC with Differential     Status: Abnormal   Collection Time: 11/29/20  7:21 PM  Result Value Ref Range   WBC 9.8 4.0 - 10.5 K/uL   RBC 4.22 3.87 - 5.11 MIL/uL   Hemoglobin 12.8 12.0 - 15.0 g/dL   HCT 41.7 36.0 - 46.0 %   MCV 98.8 80.0 - 100.0 fL   MCH 30.3 26.0 - 34.0 pg   MCHC 30.7 30.0 - 36.0 g/dL   RDW 13.1 11.5 - 15.5 %   Platelets 315 150 - 400 K/uL   nRBC 0.0 0.0 - 0.2 %   Neutrophils Relative % 49 %   Neutro Abs 4.8 1.7 - 7.7 K/uL   Lymphocytes Relative 39 %   Lymphs Abs 3.8 0.7 - 4.0 K/uL   Monocytes Relative 8 %   Monocytes Absolute 0.7 0.1 - 1.0 K/uL   Eosinophils Relative 2 %   Eosinophils Absolute 0.2 0.0 - 0.5 K/uL   Basophils Relative 1 %   Basophils Absolute 0.1 0.0 - 0.1 K/uL   Immature Granulocytes 1 %   Abs Immature Granulocytes 0.13 (H) 0.00 - 0.07 K/uL    Comment: Performed at [facility], [place], [place] [address]  Troponin I (High Sensitivity)     Status: None   Collection Time: 11/29/20  7:21 PM  Result Value Ref Range   Troponin I (High Sensitivity) 6 <18 ng/L    Comment: (NOTE) Elevated high sensitivity troponin I (hsTnI) values and significant  changes across serial measurements may suggest ACS but many other  chronic and acute conditions are known to elevate hsTnI results.  Refer to the "Links" section for chest pain algorithms and additional  guidance. Performed at [facility], [address] [place], [place] [address]   CBG monitoring, ED     Status: None   Collection Time: 11/29/20 10:11 PM  Result Value Ref Range   Glucose-Capillary 77 70 - 99 mg/dL    Comment: Glucose reference range applies only to samples taken  after fasting for at least 8 hours.  Comprehensive metabolic panel     Status: Abnormal   Collection Time: 11/29/20 10:25 PM  Result Value Ref Range   Sodium 139 135 - 145 mmol/L   Potassium 4.2 3.5 - 5.1 mmol/L   Chloride 104 98 - 111 mmol/L   CO2 26 22 - 32 mmol/L   Glucose, Bld 87 70 - 99 mg/dL    Comment: Glucose reference range applies only to samples taken after fasting for at least 8 hours.   BUN 28 (H) 6 - 20 mg/dL   Creatinine, Ser 1.49 (H) 0.44 - 1.00 mg/dL   Calcium 9.8 8.9 - 10.3 mg/dL   Total Protein 6.4 (L) 6.5 - 8.1 g/dL   Albumin 3.8 3.5 - 5.0 g/dL   AST 29 15 - 41 U/L   ALT 26 0 - 44 U/L   Alkaline Phosphatase 43 38 - 126 U/L   Total Bilirubin 0.5 0.3 - 1.2 mg/dL   GFR, Estimated 41 (L) >60 mL/min    Comment: (NOTE) Calculated using the CKD-EPI Creatinine Equation (2021)  Anion gap 9 5 - 15    Comment: Performed at [facility], [place], [place] [address]  Troponin I (High Sensitivity)     Status: None   Collection Time: 11/29/20 10:25 PM  Result Value Ref Range   Troponin I (High Sensitivity) 7 <18 ng/L    Comment: (NOTE) Elevated high sensitivity troponin I (hsTnI) values and significant  changes across serial measurements may suggest ACS but many other  chronic and acute conditions are known to elevate hsTnI results.  Refer to the "Links" section for chest pain algorithms and additional  guidance. Performed at [facility], [address] [place], [place] [address]   Lactic acid, plasma     Status: None   Collection Time: 11/29/20 10:25 PM  Result Value Ref Range   Lactic Acid, Venous 1.9 0.5 - 1.9 mmol/L    Comment: Performed at [facility], [address] [place], [place] [address]  Culture, blood (Routine X 2) w Reflex to ID Panel     Status: None   Collection Time: 11/29/20 10:25 PM   Specimen: BLOOD  Result Value Ref Range   Specimen Description BLOOD FOREARM    Special Requests      BOTTLES  DRAWN AEROBIC AND ANAEROBIC Blood Culture adequate volume   Culture      NO GROWTH 5 DAYS Performed at [facility], [address] [place], [place] [address]    Report Status 12/04/2020 FINAL   Resp Panel by RT-PCR (Flu A&B, Covid) Nasopharyngeal Swab     Status: None   Collection Time: 11/29/20 11:00 PM   Specimen: Nasopharyngeal Swab; Nasopharyngeal(NP) swabs in vial transport medium  Result Value Ref Range   SARS Coronavirus 2 by RT PCR NEGATIVE NEGATIVE    Comment: (NOTE) SARS-CoV-2 target nucleic acids are NOT DETECTED.  The SARS-CoV-2 RNA is generally detectable in upper respiratory specimens during the acute phase of infection. The lowest concentration of SARS-CoV-2 viral copies this assay can detect is 138 copies/mL. A negative result does not preclude SARS-Cov-2 infection and should not be used as the sole basis for treatment or other patient management decisions. A negative result may occur with  improper specimen collection/handling, submission of specimen other than nasopharyngeal swab, presence of viral mutation(s) within the areas targeted by this assay, and inadequate number of viral copies(<138 copies/mL). A negative result must be combined with clinical observations, patient history, and epidemiological information. The expected result is Negative.  Fact Sheet for Patients:  EntrepreneurPulse.com.au  Fact Sheet for Healthcare Providers:  IncredibleEmployment.be  This test is no t yet approved or cleared by the [place] FDA and  has been authorized for detection and/or diagnosis of SARS-CoV-2 by FDA under an Emergency Use Authorization (EUA). This EUA will remain  in effect (meaning this test can be used) for the duration of the COVID-19 declaration under Section 564(b)(1) of the Act, 21 U.S.C.section 360bbb-3(b)(1), unless the authorization is terminated  or revoked sooner.       Influenza A by PCR  NEGATIVE NEGATIVE   Influenza B by PCR NEGATIVE NEGATIVE    Comment: (NOTE) The Xpert Xpress SARS-CoV-2/FLU/RSV plus assay is intended as an aid in the diagnosis of influenza from Nasopharyngeal swab specimens and should not be used as a sole basis for treatment. Nasal washings and aspirates are unacceptable for Xpert Xpress SARS-CoV-2/FLU/RSV testing.  Fact Sheet for Patients: EntrepreneurPulse.com.au  Fact Sheet for Healthcare Providers: IncredibleEmployment.be  This test is not yet  approved or cleared by the [place] and has been authorized for detection and/or diagnosis of SARS-CoV-2 by FDA under an Emergency Use Authorization (EUA). This EUA will remain in effect (meaning this test can be used) for the duration of the COVID-19 declaration under Section 564(b)(1) of the Act, 21 U.S.C. section 360bbb-3(b)(1), unless the authorization is terminated or revoked.  Performed at [facility], [address] [place], [place] [address]   POC CBG, ED     Status: Abnormal   Collection Time: 11/30/20  1:33 AM  Result Value Ref Range   Glucose-Capillary 42 (LL) 70 - 99 mg/dL    Comment: Glucose reference range applies only to samples taken after fasting for at least 8 hours.   Comment 1 Notify RN   CBG monitoring, ED     Status: Abnormal   Collection Time: 11/30/20  1:55 AM  Result Value Ref Range   Glucose-Capillary 40 (LL) 70 - 99 mg/dL    Comment: Glucose reference range applies only to samples taken after fasting for at least 8 hours.  CBG monitoring, ED     Status: Abnormal   Collection Time: 11/30/20  2:43 AM  Result Value Ref Range   Glucose-Capillary 150 (H) 70 - 99 mg/dL    Comment: Glucose reference range applies only to samples taken after fasting for at least 8 hours.  Culture, blood (x 2)     Status: None   Collection Time: 11/30/20  3:28 AM   Specimen: BLOOD  Result Value Ref Range   Specimen Description BLOOD  BLOOD LEFT HAND    Special Requests      BOTTLES DRAWN AEROBIC AND ANAEROBIC Blood Culture adequate volume   Culture      NO GROWTH 5 DAYS Performed at [facility], [address] [place], [place] [address]    Report Status 12/05/2020 FINAL   CBC with Differential     Status: Abnormal   Collection Time: 11/30/20  3:28 AM  Result Value Ref Range   WBC 8.4 4.0 - 10.5 K/uL   RBC 3.72 (L) 3.87 - 5.11 MIL/uL   Hemoglobin 11.3 (L) 12.0 - 15.0 g/dL   HCT 35.1 (L) 36.0 - 46.0 %   MCV 94.4 80.0 - 100.0 fL   MCH 30.4 26.0 - 34.0 pg   MCHC 32.2 30.0 - 36.0 g/dL   RDW 13.3 11.5 - 15.5 %   Platelets 232 150 - 400 K/uL   nRBC 0.0 0.0 - 0.2 %   Neutrophils Relative % 59 %   Neutro Abs 5.0 1.7 - 7.7 K/uL   Lymphocytes Relative 29 %   Lymphs Abs 2.4 0.7 - 4.0 K/uL   Monocytes Relative 8 %   Monocytes Absolute 0.7 0.1 - 1.0 K/uL   Eosinophils Relative 1 %   Eosinophils Absolute 0.1 0.0 - 0.5 K/uL   Basophils Relative 1 %   Basophils Absolute 0.1 0.0 - 0.1 K/uL   Immature Granulocytes 2 %   Abs Immature Granulocytes 0.13 (H) 0.00 - 0.07 K/uL    Comment: Performed at [facility] [facility], [address] [place], [place] [address]  Comprehensive metabolic panel     Status: Abnormal   Collection Time: 11/30/20  3:28 AM  Result Value Ref Range   Sodium 138 135 - 145 mmol/L   Potassium 4.0 3.5 - 5.1 mmol/L   Chloride 106 98 - 111 mmol/L   CO2 24 22 - 32 mmol/L   Glucose, Bld 156 (H)  70 - 99 mg/dL    Comment: Glucose reference range applies only to samples taken after fasting for at least 8 hours.   BUN 27 (H) 6 - 20 mg/dL   Creatinine, Ser 1.31 (H) 0.44 - 1.00 mg/dL   Calcium 8.9 8.9 - 10.3 mg/dL   Total Protein 5.6 (L) 6.5 - 8.1 g/dL   Albumin 3.3 (L) 3.5 - 5.0 g/dL   AST 23 15 - 41 U/L   ALT 23 0 - 44 U/L   Alkaline Phosphatase 40 38 - 126 U/L   Total Bilirubin 0.5 0.3 - 1.2 mg/dL   GFR, Estimated 48 (L) >60 mL/min    Comment: (NOTE) Calculated using the CKD-EPI  Creatinine Equation (2021)    Anion gap 8 5 - 15    Comment: Performed at [facility], [address] [place], [place] [address]  Lactic acid, plasma     Status: None   Collection Time: 11/30/20  3:28 AM  Result Value Ref Range   Lactic Acid, Venous 1.1 0.5 - 1.9 mmol/L    Comment: Performed at [facility], [address] [place], [place] [address]  Protime-INR     Status: None   Collection Time: 11/30/20  3:28 AM  Result Value Ref Range   Prothrombin Time 13.9 11.4 - 15.2 seconds   INR 1.1 0.8 - 1.2    Comment: (NOTE) INR goal varies based on device and disease states. Performed at [facility], [address] [place], [place] [address]   APTT     Status: None   Collection Time: 11/30/20  3:28 AM  Result Value Ref Range   aPTT 26 24 - 36 seconds    Comment: Performed at [facility], [place], [place] [address]  Procalcitonin     Status: None   Collection Time: 11/30/20  3:28 AM  Result Value Ref Range   Procalcitonin <0.10 ng/mL    Comment:        Interpretation: PCT (Procalcitonin) <= 0.5 ng/mL: Systemic infection (sepsis) is not likely. Local bacterial infection is possible. (NOTE)       Sepsis PCT Algorithm           Lower Respiratory Tract                                      Infection PCT Algorithm    ----------------------------     ----------------------------         PCT < 0.25 ng/mL                PCT < 0.10 ng/mL          Strongly encourage             Strongly discourage   discontinuation of antibiotics    initiation of antibiotics    ----------------------------     -----------------------------       PCT 0.25 - 0.50 ng/mL            PCT 0.10 - 0.25 ng/mL               OR       >80% decrease in PCT            Discourage initiation of  antibiotics      Encourage discontinuation           of antibiotics    ----------------------------      -----------------------------         PCT >= 0.50 ng/mL              PCT 0.26 - 0.50 ng/mL               AND        <80% decrease in PCT             Encourage initiation of                                             antibiotics       Encourage continuation           of antibiotics    ----------------------------     -----------------------------        PCT >= 0.50 ng/mL                  PCT > 0.50 ng/mL               AND         increase in PCT                  Strongly encourage                                      initiation of antibiotics    Strongly encourage escalation           of antibiotics                                     -----------------------------                                           PCT <= 0.25 ng/mL                                                 OR                                        > 80% decrease in PCT                                      Discontinue / Do not initiate                                             antibiotics  Performed at [facility], [address], [place], [place] [address]   Cortisol     Status: None   Collection Time: 11/30/20  3:28 AM  Result Value Ref Range   Cortisol, Plasma 8.2 ug/dL    Comment: (NOTE) AM    6.7 - 22.6 ug/dL PM   <10.0       ug/dL Performed at [facility] [address] [place], [place] [address]   HIV Antibody (routine testing w rflx)     Status: None   Collection Time: 11/30/20  3:28 AM  Result Value Ref Range   HIV Screen 4th Generation wRfx Non Reactive Non Reactive    Comment: Performed at [facility], [place] [address] [place], [place] [address]  D-dimer, quantitative     Status: None   Collection Time: 11/30/20  3:28 AM  Result Value Ref Range   D-Dimer, Quant 0.35 0.00 - 0.50 ug/mL-FEU    Comment: (NOTE) At the manufacturer cut-off value of 0.5 g/mL FEU, this assay has a negative predictive value of 95-100%.This assay is intended for use in conjunction with a  clinical pretest probability (PTP) assessment model to exclude pulmonary embolism (PE) and deep venous thrombosis (DVT) in outpatients suspected of PE or DVT. Results should be correlated with clinical presentation. Performed at [facility], [address] [place], [place] [address]   Thyroid Panel With TSH     Status: Abnormal   Collection Time: 11/30/20  3:28 AM  Result Value Ref Range   TSH 0.069 (L) 0.450 - 4.500 uIU/mL   T4, Total 4.4 (L) 4.5 - 12.0 ug/dL   T3 Uptake Ratio 27 24 - 39 %   Free Thyroxine Index 1.2 1.2 - 4.9    Comment: (NOTE) Performed At: [facility] [place], [place] [id number] Rush Farmer MD [id number]   CBG monitoring, ED     Status: Abnormal   Collection Time: 11/30/20  6:21 AM  Result Value Ref Range   Glucose-Capillary 107 (H) 70 - 99 mg/dL    Comment: Glucose reference range applies only to samples taken after fasting for at least 8 hours.  CBG monitoring, ED     Status: Abnormal   Collection Time: 11/30/20  8:02 AM  Result Value Ref Range   Glucose-Capillary 103 (H) 70 - 99 mg/dL    Comment: Glucose reference range applies only to samples taken after fasting for at least 8 hours.   Comment 1 Notify RN    Comment 2 Document in Chart   Lactic acid, plasma     Status: None   Collection Time: 11/30/20 10:10 AM  Result Value Ref Range   Lactic Acid, Venous 1.2 0.5 - 1.9 mmol/L    Comment: Performed at [facility], [address] [place], [place] [address]  Glucose, capillary     Status: Abnormal   Collection Time: 11/30/20 11:39 AM  Result Value Ref Range   Glucose-Capillary 189 (H) 70 - 99 mg/dL    Comment: Glucose reference range applies only to samples taken after fasting for at least 8 hours.  MRSA PCR Screening     Status: None   Collection Time: 11/30/20 12:05 PM   Specimen: Nasal Mucosa; Nasopharyngeal  Result Value Ref Range   MRSA by PCR NEGATIVE NEGATIVE    Comment:        The  GeneXpert MRSA Assay (FDA approved for NASAL specimens only), is one component of a comprehensive MRSA colonization surveillance program. It is not intended to diagnose MRSA infection nor to guide or monitor treatment for MRSA infections. Performed at [facility], [address] [place], [place] [address]   Glucose, capillary     Status: Abnormal   Collection Time: 11/30/20  3:36 PM  Result Value Ref Range   Glucose-Capillary 194 (H) 70 - 99 mg/dL    Comment: Glucose reference range applies only to samples taken after fasting for at least 8 hours.  Glucose, capillary     Status: Abnormal   Collection Time: 11/30/20  5:27 PM  Result Value Ref Range   Glucose-Capillary 218 (H) 70 - 99 mg/dL    Comment: Glucose reference range applies only to samples taken after fasting for at least 8 hours.  Glucose, capillary     Status: Abnormal   Collection Time: 11/30/20  8:23 PM  Result Value Ref Range   Glucose-Capillary 228 (H) 70 - 99 mg/dL    Comment: Glucose reference range applies only to samples taken after fasting for at least 8 hours.  Glucose, capillary     Status: Abnormal   Collection Time: 11/30/20 11:28 PM  Result Value Ref Range   Glucose-Capillary 143 (H) 70 - 99 mg/dL    Comment: Glucose reference range applies only to samples taken after fasting for at least 8 hours.  Glucose, capillary     Status: Abnormal   Collection Time: 12/01/20  4:16 AM  Result Value Ref Range   Glucose-Capillary 159 (H) 70 - 99 mg/dL    Comment: Glucose reference range applies only to samples taken after fasting for at least 8 hours.  Basic metabolic panel     Status: Abnormal   Collection Time: 12/01/20  5:11 AM  Result Value Ref Range   Sodium 139 135 - 145 mmol/L   Potassium 4.5 3.5 - 5.1 mmol/L   Chloride 109 98 - 111 mmol/L   CO2 18 (L) 22 - 32 mmol/L   Glucose, Bld 179 (H) 70 - 99 mg/dL    Comment: Glucose reference range applies only to samples taken after fasting for at  least 8 hours.   BUN 17 6 - 20 mg/dL   Creatinine, Ser 1.24 (H) 0.44 - 1.00 mg/dL   Calcium 8.9 8.9 - 10.3 mg/dL   GFR, Estimated 51 (L) >60 mL/min    Comment: (NOTE) Calculated using the CKD-EPI Creatinine Equation (2021)    Anion gap 12 5 - 15    Comment: Performed at [facility], [address] [place], [place] [address]  Magnesium     Status: Abnormal   Collection Time: 12/01/20  5:11 AM  Result Value Ref Range   Magnesium 1.6 (L) 1.7 - 2.4 mg/dL    Comment: Performed at [facility], [address] [place], [place] [address]  Phosphorus     Status: None   Collection Time: 12/01/20  5:11 AM  Result Value Ref Range   Phosphorus 4.4 2.5 - 4.6 mg/dL    Comment: Performed at [facility], [address] [place],  [address]  CBC     Status: Abnormal   Collection Time: 12/01/20  5:11 AM  Result Value Ref Range   WBC 6.4 4.0 - 10.5 K/uL   RBC 3.49 (L) 3.87 - 5.11 MIL/uL   Hemoglobin 10.4 (L) 12.0 - 15.0 g/dL   HCT 33.7 (L) 36.0 - 46.0 %   MCV 96.6 80.0 - 100.0 fL   MCH 29.8 26.0 - 34.0 pg   MCHC 30.9 30.0 - 36.0 g/dL   RDW 13.4 11.5 - 15.5 %   Platelets 242 150 - 400 K/uL  nRBC 0.0 0.0 - 0.2 %    Comment: Performed at [facility], [place], [place] [address]  Procalcitonin     Status: None   Collection Time: 12/01/20  5:11 AM  Result Value Ref Range   Procalcitonin <0.10 ng/mL    Comment:        Interpretation: PCT (Procalcitonin) <= 0.5 ng/mL: Systemic infection (sepsis) is not likely. Local bacterial infection is possible. (NOTE)       Sepsis PCT Algorithm           Lower Respiratory Tract                                      Infection PCT Algorithm    ----------------------------     ----------------------------         PCT < 0.25 ng/mL                PCT < 0.10 ng/mL          Strongly encourage             Strongly discourage   discontinuation of antibiotics    initiation of antibiotics     ----------------------------     -----------------------------       PCT 0.25 - 0.50 ng/mL            PCT 0.10 - 0.25 ng/mL               OR       >80% decrease in PCT            Discourage initiation of                                            antibiotics      Encourage discontinuation           of antibiotics    ----------------------------     -----------------------------         PCT >= 0.50 ng/mL              PCT 0.26 - 0.50 ng/mL               AND        <80% decrease in PCT             Encourage initiation of                                             antibiotics       Encourage continuation           of antibiotics    ----------------------------     -----------------------------        PCT >= 0.50 ng/mL                  PCT > 0.50 ng/mL               AND         increase in PCT                  Strongly encourage  initiation of antibiotics    Strongly encourage escalation           of antibiotics                                     -----------------------------                                           PCT <= 0.25 ng/mL                                                 OR                                        > 80% decrease in PCT                                      Discontinue / Do not initiate                                             antibiotics  Performed at [facility], [address] [place], [place] [address]   Glucose, capillary     Status: Abnormal   Collection Time: 12/01/20  7:35 AM  Result Value Ref Range   Glucose-Capillary 195 (H) 70 - 99 mg/dL    Comment: Glucose reference range applies only to samples taken after fasting for at least 8 hours.  Glucose, capillary     Status: Abnormal   Collection Time: 12/01/20 11:27 AM  Result Value Ref Range   Glucose-Capillary 185 (H) 70 - 99 mg/dL    Comment: Glucose reference range applies only to samples taken after fasting for at least 8 hours.   ECHOCARDIOGRAM COMPLETE     Status: None   Collection Time: 12/01/20 12:25 PM  Result Value Ref Range   Weight 3,200 oz   Height 59 in   BP 149/81 mmHg   Ao pk vel 1.43 m/s   AV Area VTI 2.55 cm2   AR max vel 2.55 cm2   AV Mean grad 4.0 mmHg   AV Peak grad 8.2 mmHg   S' Lateral 2.80 cm   AV Area mean vel 2.45 cm2   Area-P 1/2 4.71 cm2   MV VTI 2.89 cm2  Glucose, capillary     Status: Abnormal   Collection Time: 12/01/20  2:50 PM  Result Value Ref Range   Glucose-Capillary 172 (H) 70 - 99 mg/dL    Comment: Glucose reference range applies only to samples taken after fasting for at least 8 hours.  Glucose, capillary     Status: Abnormal   Collection Time: 12/01/20  6:22 PM  Result Value Ref Range   Glucose-Capillary 163 (H) 70 - 99 mg/dL    Comment: Glucose reference range applies only to samples taken after fasting for at least 8 hours.  Glucose, capillary  Status: Abnormal   Collection Time: 12/01/20  7:23 PM  Result Value Ref Range   Glucose-Capillary 139 (H) 70 - 99 mg/dL    Comment: Glucose reference range applies only to samples taken after fasting for at least 8 hours.  Glucose, capillary     Status: Abnormal   Collection Time: 12/01/20  9:08 PM  Result Value Ref Range   Glucose-Capillary 125 (H) 70 - 99 mg/dL    Comment: Glucose reference range applies only to samples taken after fasting for at least 8 hours.  Basic metabolic panel     Status: Abnormal   Collection Time: 12/02/20  6:07 AM  Result Value Ref Range   Sodium 141 135 - 145 mmol/L   Potassium 4.1 3.5 - 5.1 mmol/L   Chloride 106 98 - 111 mmol/L   CO2 23 22 - 32 mmol/L   Glucose, Bld 110 (H) 70 - 99 mg/dL    Comment: Glucose reference range applies only to samples taken after fasting for at least 8 hours.   BUN 14 6 - 20 mg/dL   Creatinine, Ser 0.93 0.44 - 1.00 mg/dL   Calcium 9.4 8.9 - 10.3 mg/dL   GFR, Estimated >60 >60 mL/min    Comment: (NOTE) Calculated using the CKD-EPI Creatinine Equation  (2021)    Anion gap 12 5 - 15    Comment: Performed at [facility], [address] [place], [place] [address]  Procalcitonin     Status: None   Collection Time: 12/02/20  6:07 AM  Result Value Ref Range   Procalcitonin <0.10 ng/mL    Comment:        Interpretation: PCT (Procalcitonin) <= 0.5 ng/mL: Systemic infection (sepsis) is not likely. Local bacterial infection is possible. (NOTE)       Sepsis PCT Algorithm           Lower Respiratory Tract                                      Infection PCT Algorithm    ----------------------------     ----------------------------         PCT < 0.25 ng/mL                PCT < 0.10 ng/mL          Strongly encourage             Strongly discourage   discontinuation of antibiotics    initiation of antibiotics    ----------------------------     -----------------------------       PCT 0.25 - 0.50 ng/mL            PCT 0.10 - 0.25 ng/mL               OR       >80% decrease in PCT            Discourage initiation of                                            antibiotics      Encourage discontinuation           of antibiotics    ----------------------------     -----------------------------         PCT >= 0.50 ng/mL  PCT 0.26 - 0.50 ng/mL               AND        <80% decrease in PCT             Encourage initiation of                                             antibiotics       Encourage continuation           of antibiotics    ----------------------------     -----------------------------        PCT >= 0.50 ng/mL                  PCT > 0.50 ng/mL               AND         increase in PCT                  Strongly encourage                                      initiation of antibiotics    Strongly encourage escalation           of antibiotics                                     -----------------------------                                           PCT <= 0.25 ng/mL                                                  OR                                        > 80% decrease in PCT                                      Discontinue / Do not initiate                                             antibiotics  Performed at [facility], [address] [place], [place] [address]   CBC     Status: Abnormal   Collection Time: 12/02/20  6:07 AM  Result Value Ref Range   WBC 6.6 4.0 - 10.5 K/uL   RBC 3.44 (L) 3.87 - 5.11 MIL/uL   Hemoglobin 10.5 (L) 12.0 - 15.0 g/dL   HCT 32.4 (L) 36.0 - 46.0 %   MCV 94.2 80.0 - 100.0 fL   MCH  30.5 26.0 - 34.0 pg   MCHC 32.4 30.0 - 36.0 g/dL   RDW 13.6 11.5 - 15.5 %   Platelets 233 150 - 400 K/uL   nRBC 0.0 0.0 - 0.2 %    Comment: Performed at [facility], [address] [place], [place] [address]  Magnesium     Status: None   Collection Time: 12/02/20  6:07 AM  Result Value Ref Range   Magnesium 1.8 1.7 - 2.4 mg/dL    Comment: Performed at [facility], [address] [place], [place] [address]  Glucose, capillary     Status: Abnormal   Collection Time: 12/02/20  7:50 AM  Result Value Ref Range   Glucose-Capillary 104 (H) 70 - 99 mg/dL    Comment: Glucose reference range applies only to samples taken after fasting for at least 8 hours.  Glucose, capillary     Status: Abnormal   Collection Time: 12/02/20 11:22 AM  Result Value Ref Range   Glucose-Capillary 143 (H) 70 - 99 mg/dL    Comment: Glucose reference range applies only to samples taken after fasting for at least 8 hours.  Glucose, capillary     Status: Abnormal   Collection Time: 12/02/20  4:31 PM  Result Value Ref Range   Glucose-Capillary 152 (H) 70 - 99 mg/dL    Comment: Glucose reference range applies only to samples taken after fasting for at least 8 hours.  Glucose, capillary     Status: Abnormal   Collection Time: 12/02/20  9:05 PM  Result Value Ref Range   Glucose-Capillary 125 (H) 70 - 99 mg/dL    Comment: Glucose reference range applies only to samples taken  after fasting for at least 8 hours.  Basic metabolic panel     Status: Abnormal   Collection Time: 12/03/20  6:34 AM  Result Value Ref Range   Sodium 139 135 - 145 mmol/L   Potassium 4.3 3.5 - 5.1 mmol/L   Chloride 105 98 - 111 mmol/L   CO2 24 22 - 32 mmol/L   Glucose, Bld 138 (H) 70 - 99 mg/dL    Comment: Glucose reference range applies only to samples taken after fasting for at least 8 hours.   BUN 14 6 - 20 mg/dL   Creatinine, Ser 0.96 0.44 - 1.00 mg/dL   Calcium 9.5 8.9 - 10.3 mg/dL   GFR, Estimated >60 >60 mL/min    Comment: (NOTE) Calculated using the CKD-EPI Creatinine Equation (2021)    Anion gap 10 5 - 15    Comment: Performed at [facility], [address] [place], [place] [address]  Glucose, capillary     Status: Abnormal   Collection Time: 12/03/20  7:36 AM  Result Value Ref Range   Glucose-Capillary 144 (H) 70 - 99 mg/dL    Comment: Glucose reference range applies only to samples taken after fasting for at least 8 hours.  Glucose, capillary     Status: Abnormal   Collection Time: 12/03/20 11:10 AM  Result Value Ref Range   Glucose-Capillary 263 (H) 70 - 99 mg/dL    Comment: Glucose reference range applies only to samples taken after fasting for at least 8 hours.  POCT Urinalysis Dipstick     Status: Abnormal   Collection Time: 12/27/20 10:26 AM  Result Value Ref Range   Color, UA Yellow    Clarity, UA Clear    Glucose, UA Positive (A) Negative   Bilirubin, UA Negative    Ketones,  UA Negative    Spec Grav, UA 1.020 1.010 - 1.025   Blood, UA Negative    pH, UA 5.0 5.0 - 8.0   Protein, UA Negative Negative   Urobilinogen, UA 0.2 0.2 or 1.0 E.U./dL   Nitrite, UA Negative    Leukocytes, UA Negative Negative   Appearance     Odor        PHQ2/9: Depression screen [facility] 2/9 12/27/2020 11/04/2020 08/17/2020 08/03/2020 07/13/2020  Decreased Interest 1 2 0 0 0  Down, Depressed, Hopeless 2 2 - 0 0  PHQ - 2 Score 3 4 0 0 0  Altered sleeping 0 0 0 0 0   Tired, decreased energy 1 2 0 0 0  Change in appetite 2 3 2  0 0  Feeling bad or failure about yourself  0 2 0 0 0  Trouble concentrating 0 1 0 0 0  Moving slowly or fidgety/restless 0 2 0 0 0  Suicidal thoughts 0 0 0 0 0  PHQ-9 Score 6 14 2  0 0  Difficult doing work/chores - Not difficult at all Not difficult at all Not difficult at all Not difficult at all  Some recent data might be hidden    phq 9 is positive   Fall Risk: Fall Risk  12/27/2020 11/04/2020 08/17/2020 08/03/2020 07/13/2020  Falls in the past year? 0 0 0 0 0  Comment - - - - -  Number falls in past yr: 0 0 0 0 0  Comment - - - - -  Injury with Fall? 0 0 0 0 0  Comment - - - - -  Risk for fall due to : - - - - -  Follow up - - - Falls evaluation completed Falls evaluation completed    Functional Status Survey: Is the patient deaf or have difficulty hearing?: No Does the patient have difficulty seeing, even when wearing glasses/contacts?: No Does the patient have difficulty concentrating, remembering, or making decisions?: No Does the patient have difficulty walking or climbing stairs?: No Does the patient have difficulty dressing or bathing?: No Does the patient have difficulty doing errands alone such as visiting a doctor's office or shopping?: No    Assessment & Plan  1. Dysuria  - POCT Urinalysis Dipstick - CULTURE, URINE COMPREHENSIVE  2. Glycosuria  Needs to get better control of DM, has follow up coming with up with Dr. Ronnald Collum   3. Hospital discharge follow-up  Reviewed labs with patient   4. Migraine without aura and without status migrainosus, not intractable  - SUMAtriptan (IMITREX) 100 MG tablet; Take 1 tablet (100 mg total) by mouth every 2 (two) hours as needed for migraine. May repeat in 2 hours if headache persists or recurs.  Dispense: 10 tablet; Refill: 0  5. Type 2 diabetes, uncontrolled, with neuropathy ([facility])

## 2020-12-24 NOTE — Telephone Encounter (Signed)
Patient has bad indigestion and nausea. Out of phengran. Would like a call back from the nurse.

## 2020-12-27 ENCOUNTER — Ambulatory Visit (INDEPENDENT_AMBULATORY_CARE_PROVIDER_SITE_OTHER): Payer: Medicare Other | Admitting: Family Medicine

## 2020-12-27 ENCOUNTER — Encounter: Payer: Self-pay | Admitting: Family Medicine

## 2020-12-27 ENCOUNTER — Telehealth: Payer: Self-pay

## 2020-12-27 ENCOUNTER — Other Ambulatory Visit: Payer: Self-pay

## 2020-12-27 VITALS — BP 122/78 | HR 82 | Temp 98.2°F | Resp 16 | Ht 59.0 in | Wt 195.0 lb

## 2020-12-27 DIAGNOSIS — R300 Dysuria: Secondary | ICD-10-CM | POA: Diagnosis not present

## 2020-12-27 DIAGNOSIS — Z09 Encounter for follow-up examination after completed treatment for conditions other than malignant neoplasm: Secondary | ICD-10-CM

## 2020-12-27 DIAGNOSIS — IMO0002 Reserved for concepts with insufficient information to code with codable children: Secondary | ICD-10-CM

## 2020-12-27 DIAGNOSIS — G43009 Migraine without aura, not intractable, without status migrainosus: Secondary | ICD-10-CM

## 2020-12-27 DIAGNOSIS — R81 Glycosuria: Secondary | ICD-10-CM

## 2020-12-27 DIAGNOSIS — E1140 Type 2 diabetes mellitus with diabetic neuropathy, unspecified: Secondary | ICD-10-CM

## 2020-12-27 DIAGNOSIS — E1165 Type 2 diabetes mellitus with hyperglycemia: Secondary | ICD-10-CM

## 2020-12-27 LAB — POCT URINALYSIS DIPSTICK
Bilirubin, UA: NEGATIVE
Blood, UA: NEGATIVE
Glucose, UA: POSITIVE — AB
Ketones, UA: NEGATIVE
Leukocytes, UA: NEGATIVE
Nitrite, UA: NEGATIVE
Protein, UA: NEGATIVE
Spec Grav, UA: 1.02 (ref 1.010–1.025)
Urobilinogen, UA: 0.2 E.U./dL
pH, UA: 5 (ref 5.0–8.0)

## 2020-12-27 MED ORDER — SUMATRIPTAN SUCCINATE 100 MG PO TABS: 100.0000 mg | ORAL_TABLET | Freq: Once | ORAL | 0 refills | Status: DC

## 2020-12-27 MED ORDER — SUMATRIPTAN SUCCINATE 100 MG PO TABS: 100.0000 mg | ORAL_TABLET | ORAL | 0 refills | Status: DC | PRN

## 2020-12-27 NOTE — Telephone Encounter (Signed)
Pt was seen today  Copied from [place] [phone]. Topic: General - Other >> Dec 27, 2020 12:05 PM Tessa Lerner A wrote: Reason for CRM: Dawn with [employer] has made contact needing clarification on directions for patient's SUMAtriptan (IMITREX) 100 MG tablet  prescription  The directions submitted with the prescription exceed the manufactures maximum recommended dosage of 200MG within a 24 hour period  Please contact to further advise when possible

## 2020-12-28 NOTE — Telephone Encounter (Signed)
Returned pt's call regarding symptoms and a refill on her promethazine. Pt didn't realize she had a refill at her pharmacy. Advised her to contact her pharmacy when she is ready for a refill. Pt also is having break through reflux. Advised pt to change the time of day she is taking her Dexilant to see if this helps. She will try this for a couple of weeks and call back if no change.

## 2020-12-29 LAB — CULTURE, URINE COMPREHENSIVE
MICRO NUMBER:: 11813284
SPECIMEN QUALITY:: ADEQUATE

## 2020-12-30 ENCOUNTER — Telehealth: Payer: Medicare Other

## 2020-12-30 ENCOUNTER — Telehealth: Payer: Self-pay

## 2020-12-30 NOTE — Telephone Encounter (Signed)
  Chronic Care Management   Outreach Note  12/30/2020 Name: [name] MRN: [id number] DOB: 05/04/1966  Primary Care Provider: Steele Sizer, MD Reason for referral : Chronic Care Management   An unsuccessful telephone outreach was attempted today. [name] was referred to the case management team for assistance with care management and care coordination.     Follow Up Plan:  A HIPAA compliant voice message was left today requesting a return call. Will inform the Care Guide team of need to reschedule.    [facility] Care [facility] [phone]

## 2021-01-03 ENCOUNTER — Telehealth: Payer: Self-pay

## 2021-01-03 NOTE — Telephone Encounter (Signed)
Copied from [place] [phone]. Topic: General - Other >> Jan 03, 2021  8:20 AM Leward Quan A wrote: Reason for CRM: Patient called in to inform Dr Ancil Boozer that she is having burning during urination, abdominal pressure and frequency in urinating since Saturday 01/01/21. Say that she need to come in and to have another urine check. Please call Ph# [phone]

## 2021-01-04 ENCOUNTER — Ambulatory Visit: Payer: Medicare Other | Admitting: Physician Assistant

## 2021-01-04 ENCOUNTER — Inpatient Hospital Stay: Payer: Medicare Other | Admitting: Family Medicine

## 2021-01-04 ENCOUNTER — Ambulatory Visit (INDEPENDENT_AMBULATORY_CARE_PROVIDER_SITE_OTHER): Payer: Medicare Other

## 2021-01-04 ENCOUNTER — Other Ambulatory Visit: Payer: Self-pay

## 2021-01-04 DIAGNOSIS — R300 Dysuria: Secondary | ICD-10-CM | POA: Diagnosis not present

## 2021-01-04 LAB — POCT URINALYSIS DIPSTICK
Bilirubin, UA: NEGATIVE
Blood, UA: NEGATIVE
Glucose, UA: POSITIVE — AB
Ketones, UA: NEGATIVE
Leukocytes, UA: NEGATIVE
Nitrite, UA: NEGATIVE
Protein, UA: NEGATIVE
Spec Grav, UA: 1.02 (ref 1.010–1.025)
Urobilinogen, UA: 0.2 E.U./dL
pH, UA: 5 (ref 5.0–8.0)

## 2021-01-05 LAB — URINE CULTURE
MICRO NUMBER:: 11844478
SPECIMEN QUALITY:: ADEQUATE

## 2021-01-06 ENCOUNTER — Encounter: Payer: Self-pay | Admitting: Physician Assistant

## 2021-01-06 ENCOUNTER — Telehealth: Payer: Self-pay

## 2021-01-06 ENCOUNTER — Other Ambulatory Visit: Payer: Self-pay

## 2021-01-06 ENCOUNTER — Ambulatory Visit (INDEPENDENT_AMBULATORY_CARE_PROVIDER_SITE_OTHER): Payer: Medicare Other | Admitting: Physician Assistant

## 2021-01-06 VITALS — BP 120/80 | HR 77 | Ht 59.0 in | Wt 195.0 lb

## 2021-01-06 DIAGNOSIS — G8929 Other chronic pain: Secondary | ICD-10-CM

## 2021-01-06 DIAGNOSIS — I5032 Chronic diastolic (congestive) heart failure: Secondary | ICD-10-CM | POA: Diagnosis not present

## 2021-01-06 DIAGNOSIS — R002 Palpitations: Secondary | ICD-10-CM | POA: Diagnosis not present

## 2021-01-06 DIAGNOSIS — I2510 Atherosclerotic heart disease of native coronary artery without angina pectoris: Secondary | ICD-10-CM

## 2021-01-06 DIAGNOSIS — E785 Hyperlipidemia, unspecified: Secondary | ICD-10-CM | POA: Diagnosis not present

## 2021-01-06 DIAGNOSIS — R079 Chest pain, unspecified: Secondary | ICD-10-CM | POA: Diagnosis not present

## 2021-01-06 DIAGNOSIS — G4733 Obstructive sleep apnea (adult) (pediatric): Secondary | ICD-10-CM

## 2021-01-06 DIAGNOSIS — I10 Essential (primary) hypertension: Secondary | ICD-10-CM | POA: Diagnosis not present

## 2021-01-06 DIAGNOSIS — E782 Mixed hyperlipidemia: Secondary | ICD-10-CM

## 2021-01-06 MED ORDER — METOPROLOL TARTRATE 25 MG PO TABS: 25.0000 mg | ORAL_TABLET | Freq: Two times a day (BID) | ORAL | 1 refills | Status: DC

## 2021-01-06 NOTE — Patient Instructions (Signed)
Medication Instructions:  - Your physician has recommended you make the following change in your medication:   1) INCREASE metoprolol tartrate 25 mg- take 1 tablet by mouth TWICE daily   *If you need a refill on your cardiac medications before your next appointment, please call your pharmacy*   Lab Work: - none ordered  If you have labs (blood work) drawn today and your tests are completely normal, you will receive your results only by: Marland Kitchen MyChart Message (if you have MyChart) OR . A paper copy in the mail If you have any lab test that is abnormal or we need to change your treatment, we will call you to review the results.   Testing/Procedures: - none ordered   Follow-Up: At [facility], you and your health needs are our priority.  As part of our continuing mission to provide you with exceptional heart care, we have created designated Provider Care Teams.  These Care Teams include your primary Cardiologist (physician) and Advanced Practice Providers (APPs -  Physician Assistants and Nurse Practitioners) who all work together to provide you with the care you need, when you need it.  We recommend signing up for the patient portal called "MyChart".  Sign up information is provided on this After Visit Summary.  MyChart is used to connect with patients for Virtual Visits (Telemedicine).  Patients are able to view lab/test results, encounter notes, upcoming appointments, etc.  Non-urgent messages can be sent to your provider as well.   To learn more about what you can do with MyChart, go to [email or web address].    Your next appointment:   3 month(s)  The format for your next appointment:   In Person  Provider:   You may see Ida Rogue, MD or one of the following Advanced Practice Providers on your designated Care Team:    Murray Hodgkins, NP  Christell Faith, PA-C  Marrianne Mood, PA-C  Cadence Marion, [place]  Laurann Montana, NP    Other Instructions n/a

## 2021-01-06 NOTE — Telephone Encounter (Signed)
Copied from [place] [id number]. Topic: General - Other >> Jan 06, 2021  9:32 AM Celene Kras wrote: Reason for CRM: Pt called stating that she is needing to have her UA results. She is requesting to have nurse give a call back to discuss. Please advise.

## 2021-01-06 NOTE — Progress Notes (Signed)
Office Visit    Patient Name: [name] Date of Encounter: 01/06/2021  Primary Care Provider:  Steele Sizer, MD Primary Cardiologist:  Ida Rogue, MD  Chief Complaint    Chief Complaint  Patient presents with  . Hospitalization Follow-up    Hospital follow up for irregular heartbeat and chest pain. Per patient the irregular heart beat is worse at night. Medications verbally reviewed with patient.     55 yo female h/o uncontrolled DM 2, hypothyroidism, NASH, restless leg syndrome, chronic back pain, sinusitis, depression, recent COVID-19 infection, and who presents today for 2 week follow-up of her chest pain and palpitations.  Past Medical History    Past Medical History:  Diagnosis Date  . Anxiety   . Arthritis   . Asthma   . Bipolar depression ([place])   . CHF (congestive heart failure) ([place])   . Chronic headaches    migraines have lessened  . Chronic kidney disease    ddstage 111  . COPD (chronic obstructive pulmonary disease) ([place])   . Coronary artery disease   . Depression   . Diabetic neuropathy ([place])   . Dyspnea    uses inhalers for this  . Dysrhythmia    rapid  . Fatty liver   . GERD (gastroesophageal reflux disease)   . Headache   . Heart attack ([place]) 2009  . Hyperlipidemia   . Hypertension   . Hypomagnesemia   . Hypothyroid   . Irregular heart rhythm   . Liver lesion    Favored to be benign on CT; MRI of abd w/contract in Aug 2016. Liver Biopsy-Negative, March 2016  . Morbid obesity ([place])   . OSA (obstructive sleep apnea)   . Peripheral vascular disease ([place])   . Pinched nerve    Back  . Pneumonia   . RLS (restless legs syndrome)   . Seasonal allergies   . Sleep apnea    uses oxygen at night 2L d/t cpap did not fit  . Type 2 diabetes, uncontrolled, with neuropathy ([place]) 10/15/2015   Overview:  Microalbumin 7.7 12/2010: Hgb A1c 10.2% 12/2010 and 9.7% 03/2011, eye exam 10/2009 [facility] DM clinic   . Urinary incontinence   . Vitamin D deficiency     Past Surgical History:  Procedure Laterality Date  . ABDOMINAL HYSTERECTOMY     Partial  . bladder stim  2007  . BLADDER SURGERY     x 2. tacking of bladder and the other was the stimulator placement  . CHOLECYSTECTOMY    . COLONOSCOPY WITH PROPOFOL N/A 03/04/2019   Procedure: COLONOSCOPY WITH PROPOFOL;  Surgeon: [name], MD;  [facility] END[facility];  Service: Endoscopy;  Laterality: N/A;  . ESOPHAGOGASTRODUODENOSCOPY (EGD) WITH PROPOFOL N/A 03/04/2019   Procedure: ESOPHAGOGASTRODUODENOSCOPY (EGD) WITH PROPOFOL;  Surgeon: [name], MD;  Location: [facility] ENDOSCOPY;  Service: Endoscopy;  Laterality: N/A;  . INCISION AND DRAINAGE ABSCESS Right 04/16/2019   Procedure: INCISION AND DRAINAGE ABSCESS;  Surgeon: Benjamine Sprague, DO;  Location: [facility] ORS;  Service: General;  Laterality: Right;  . INTERSTIM-GENERATOR CHANGE N/A 12/31/2017   Procedure: INTERSTIM-GENERATOR CHANGE;  Surgeon: Bjorn Loser, MD;  Location: [facility] ORS;  Service: Urology;  Laterality: N/A;  . LIVER BIOPSY  March 2016   Negative  . LUNG BIOPSY  2015   small spot on lung  . TUBAL LIGATION    . UPPER GI ENDOSCOPY    . WRIST SURGERY Right 11/02/2019    Allergies  Allergies  Allergen Reactions  . Mirtazapine  Anaphylaxis    REACTION: closes throat  . Morphine And Related     anaphylaxis  . Sulfamethoxazole-Trimethoprim Rash    REACTION: Whelps all over  . Vicodin Hp [Hydrocodone-Acetaminophen]   . Amoxicillin Er Nausea And Vomiting    Pt doesn't remember problem with amoxicillin -  SE Has patient had a PCN reaction causing immediate rash, facial/tongue/throat swelling, SOB or lightheadedness with hypotension: No Has patient had a PCN reaction causing severe rash involving mucus membranes or skin necrosis: No Has patient had a PCN reaction that required hospitalization: No Has patient had a PCN reaction occurring within the last 10 years: Yes If all of the above answers are "NO", then may proceed with  Cephalosporin use.  . Codeine Itching    REACTION: itch, rash  . Hydrocodone-Acetaminophen Rash    REACTION: rash  . Keflex [Cephalexin] Rash    Pt does not remember having a reaction or rash, no airway involvement  . Prasterone Nausea And Vomiting    History of Present Illness    [name] is a 55 y.o. female with PMH as above.     She presented to the emergency department 06/26/2017 with chest pain, described as burning central chest pain that was nonradiating and going on since earlier that morning.  Chest pain was mild to moderate in intensity, constant for the last 8 to 9 hours, and somewhat pleuritic.  It was also worse when laying flat and better sitting upright.   2018 CT chest scan was reviewed with patient at her last visit and noted to be unable to exclude mild coronary artery calcification with mid LAD otherwise no aortic atherosclerosis.  She was last seen 11/12/2019 by her primary cardiologist, Dr. Rockey Situ.  At that time, she reported she was tired and having some chest pain at rest and occasionally with exertion.  She reported staying in the house mostly and playing on her computer.  She also noted generalized fatigue and weakness with multiple falls.  She was being evaluated by endocrine for DM2 via [facility].  She reported heart racing on metoprolol.  Her husband was helping with groceries.     Echo was ordered with recommendation to consider future stress. Walker was recommended, as well as glycemic control.  Echo showed EF 55 to 60%, NRWMA.   In 04/2020 she reports testing positive for COVID-19 after having a cough, fever, and SOB. She was admitted for 4 days then discharged home. I am unable to find the positive COVID test to confirm this diagnosis, though she was admitted during that time.   04/2020 CT scan performed as below.  She was seen at [facility] ED 05/2020 for dehydration. She reported episodes of bouncing off of the walls and slurred speech. She was on 2L [place] O2 at night  and had not been able to go back to her normal home O2. She was noted to be orthostatic and hypoglycemic and treated with bolus of IV fluids. Case was discussed with neuro, which though the case not consistent with stroke Lab work showed worsening uremia.   Seen 06/07/2020 with r/o ongoing/chronic dizziness and 13 falls in the last year.  He notes unchanged SOB, DOE, atypical CP, and 3 pillow orthopnea.  SBP 110-130s.  She has needed PM O2 since her COVID-19 infection.  In addition to restarting her beta-blocker, echo was recommended. She requested referral to nephrology, which was also provided given her kidney function.  Subsequent echo showed EF 55 to 60%, NRWMA, moderate asymmetric  left ventricular hypertrophy of the septal segment (?HOCM), indeterminate diastolic parameters.  She was seen 06/25/2020 and reported feeling about the same with atypical chest pain reported.  Carotids were ordered without significant disease.  She noted improvement in her racing heart rate and palpitations since restarting her beta-blocker.  Seen 10/13/2020 by her primary cardiologist and reported chest pain with coronary artery disease noted by CTA.  MPI was ordered by her primary cardiologist and ruled a low risk scan.  CT attenuation correction images showed mild aortic atherosclerosis with moderate proximal to mid LAD disease.  Today, 01/06/2021, she returns to clinic and notes ongoing symptoms as above.  She reports atypical chest pain that occurs in the center of her chest and is worse when she is upset or has anxiety.  She does note a lot of anxiety lately and for which she plans to go see a psychiatrist at the end of this month.  She has not taken her nitro for this pain.  She reports the pain is similar to that reported at previous office visits.  Her breathing is stable from previous visits.  She can still hear her heart beating in her ear with carotid studies reviewed.  She states that she was initially taken off her  beta-blocker during her admission but the beta-blocker was restarted.  She will have 1/1-second of palpitations that then self resolves.  She denies any further falls or dizziness since returning home 11/30/2020.  She reports swelling in her legs for the past 2 to 3 weeks though not seen on exam.  She denies any orthopnea or any other signs or symptoms of volume overload.  She is using a bubble pack for her medications.  No signs or symptoms of bleeding.  Home Medications    Prior to Admission medications   Medication Sig Start Date End Date Taking? Authorizing Provider  albuterol (VENTOLIN HFA) 108 (90 Base) MCG/ACT inhaler Inhale 2 puffs into the lungs every 6 (six) hours as needed for wheezing or shortness of breath. 08/12/19   Hubbard Hartshorn, FNP  atorvastatin (LIPITOR) 80 MG tablet Take 80 mg by mouth at bedtime.     [provider]  busPIRone (BUSPAR) 10 MG tablet Take 20 mg by mouth 2 (two) times daily.     [provider]  citalopram (CELEXA) 20 MG tablet Take 20 mg by mouth daily. 01/16/20   [provider]  dexamethasone (DECADRON) 2 MG tablet Take 2 mg by mouth 3 (three) times daily. 05/03/20   [provider]  dextromethorphan-guaiFENesin (MUCINEX DM) 30-600 MG 12hr tablet Take 1 tablet by mouth 2 (two) times daily. Patient not taking: Reported on 05/18/2020 05/02/20   Lavina Hamman, MD  empagliflozin (JARDIANCE) 25 MG TABS tablet Take 25 mg by mouth daily.    [provider]  ezetimibe (ZETIA) 10 MG tablet Take 10 mg by mouth daily.  01/05/17   [provider]  fenofibrate micronized (LOFIBRA) 134 MG capsule Take 134 mg by mouth daily.    [provider]  fluticasone (FLONASE) 50 MCG/ACT nasal spray Place 2 sprays into both nostrils daily. 01/26/20   Steele Sizer, MD  gabapentin (NEURONTIN) 300 MG capsule Take 1-2 capsules (300-600 mg total) by mouth 3 (three) times daily. Take 1 capsule each morning, 1 capsule each afternoon,  and 2 capsules at bedtime. 03/17/20   Steele Sizer, MD  insulin aspart (NOVOLOG FLEXPEN) 100 UNIT/ML FlexPen Inject 3 Units subcutaneously. Sliding scale.    [provider]  insulin regular human CONCENTRATED (HUMULIN R) 500 UNIT/ML injection Inject 70 Units into the skin 3 (three) times daily with meals.    [provider]  lamoTRIgine (LAMICTAL) 200 MG tablet Take 200 mg by mouth 2 (two) times daily.     [provider]  levothyroxine (SYNTHROID) 50 MCG tablet TAKE ONE TABLET DAILY 3 DAYS A WEEK, THEN TAKE ONE AND ONE-HALF TABLETS FOUR DAYS A WEEK Patient taking differently: Take 50 mcg by mouth See admin instructions. Take one tablet daily for 3 days a week, then 1 1/2 tablets 4 days a week 05/11/20   Steele Sizer, MD  lisinopril (ZESTRIL) 2.5 MG tablet TAKE 1 TABLET BY MOUTH EVERY DAY Patient taking differently: Take 2.5 mg by mouth daily.  01/06/20   Steele Sizer, MD  metoprolol tartrate (LOPRESSOR) 25 MG tablet Take 37.5 mg by mouth daily.    [provider]  ondansetron (ZOFRAN) 4 MG tablet TAKE 1 TABLET BY MOUTH EVERY 8 HOURS AS NEEDED FOR NAUSEA OR VOMITING Patient taking differently: Take 4 mg by mouth every 8 (eight) hours as needed for nausea or vomiting.  09/15/19   [name], MD  pantoprazole (PROTONIX) 40 MG tablet Take 1 tablet (40 mg total) by mouth daily. 08/20/19   [name], MD  polyethylene glycol (MIRALAX) 17 g packet Take 17 g by mouth daily. Patient taking differently: Take 17 g by mouth daily as needed for mild constipation.  10/27/19   Nance Pear, MD  QUEtiapine (SEROQUEL) 200 MG tablet Take 200 mg by mouth at bedtime. Along with 378m dose for a total of 5035m   [provider]  vitamin C (ASCORBIC ACID) 250 MG tablet Take 1 tablet (250 mg total) by mouth daily. 05/02/20   PaLavina HammanMD  Zinc 220 (50 Zn) MG CAPS Take 1 tablet by mouth daily. 05/02/20   PaLavina HammanMD    Review of Systems    She  reports atypical chest pain, as described in the past, and made worse with anxiety or emotion.  She denies any further dizziness or falls.  She notes bilateral lower extremity edema for the last 2 weeks.  If she has palpitations, they only occur for a brief second.  She can sometimes hear her heartbeat in her ears.  She denies any increased dyspnea, PND, orthopnea, nausea, emesis, syncope, weight gain, or early satiety. All other systems reviewed and are otherwise negative except as noted above.  Physical Exam    VS:  Ht 4' 11"  (1.499 m)   Wt 195 lb (88.5 kg)   SpO2 94%   BMI 39.39 kg/m  , BMI Body mass index is 39.39 kg/m. GEN: Well nourished, well developed, in no acute distress. HEENT: normal. Neck: Supple, no JVD. No bruit or masses. Cardiac: RRR, 1/6 systolic murmur. No rubs, or gallops. No clubbing, cyanosis. No bilateral edema.  Radials/DP/PT 2+ and equal bilaterally.  Respiratory:  Respirations regular and unlabored, clear to auscultation bilaterally. GI: Soft, nontender, nondistended, BS + x 4. MS: no deformity or atrophy. Skin: warm and dry, no rash. Neuro:  Strength and sensation are intact. Psych: Normal affect.  Accessory Clinical Findings    ECG personally reviewed by me today - NSR, 77bpm, PRi 17231mRS 66m93mo acute changes.  VITALS Reviewed today   Temp Readings from Last 3 Encounters:  12/27/20 98.2 F (36.8 C) (Oral)  12/03/20 99.5 F (37.5 C) (Oral)  11/09/20 98.4 F (36.9 C) (Oral)  BP Readings from Last 3 Encounters:  12/27/20 122/78  12/03/20 (!) 141/68  11/09/20 (!) 142/75   Pulse Readings from Last 3 Encounters:  12/27/20 82  12/03/20 92  11/09/20 85    Wt Readings from Last 3 Encounters:  01/06/21 195 lb (88.5 kg)  12/27/20 195 lb (88.5 kg)  11/29/20 200 lb (90.7 kg)     LABS  reviewed today   Lab Results  Component Value Date   WBC 6.6 12/02/2020   HGB 10.5 (L) 12/02/2020   HCT 32.4 (L) 12/02/2020   MCV 94.2 12/02/2020   PLT 233  12/02/2020   Lab Results  Component Value Date   CREATININE 0.96 12/03/2020   BUN 14 12/03/2020   NA 139 12/03/2020   K 4.3 12/03/2020   CL 105 12/03/2020   CO2 24 12/03/2020   Lab Results  Component Value Date   ALT 23 11/30/2020   AST 23 11/30/2020   ALKPHOS 40 11/30/2020   BILITOT 0.5 11/30/2020   Lab Results  Component Value Date   CHOL 196 09/28/2016   HDL 27 (A) 10/26/2020   LDLCALC 37 10/26/2020   LDLDIRECT 65.4 06/08/2008   TRIG 226 (A) 10/26/2020   CHOLHDL 5.9 (H) 09/28/2016    Lab Results  Component Value Date   HGBA1C 8.7 10/26/2020   Lab Results  Component Value Date   TSH 0.069 (L) 11/30/2020     STUDIES/PROCEDURES reviewed today   MPI 10/2020 Pharmacological myocardial perfusion imaging study with no significant  ischemia Normal wall motion, EF estimated at 65% No EKG changes concerning for ischemia at peak stress or in recovery. CT attenuation correction images with mild aortic atherosclerosis, moderate proximal to mid LAD disease. Low risk scan  Echo 12/01/20 1. Left ventricular ejection fraction, by estimation, is 60 to 65%. The  left ventricle has normal function. The left ventricle has no regional  wall motion abnormalities. There is mild left ventricular hypertrophy.  Left ventricular diastolic parameters  were normal. The global longitudinal strain is normal.  2. Right ventricular systolic function is normal. The right ventricular  size is normal. Tricuspid regurgitation signal is inadequate for assessing  PA pressure.  3. The mitral valve is normal in structure. No evidence of mitral valve  regurgitation. No evidence of mitral stenosis.  4. The aortic valve is normal in structure. Aortic valve regurgitation is  not visualized. No aortic stenosis is present.   Carotid study 07/12/20 Summary:  Right Carotid: There is no evidence of stenosis in the right ICA.  Left Carotid: There is no evidence of stenosis in the left ICA.   Vertebrals: Bilateral vertebral arteries demonstrate antegrade flow.  Subclavians: Normal flow hemodynamics were seen in bilateral subclavian        arteries.    Echo 06/08/20 1. Left ventricular ejection fraction, by estimation, is 55 to 60%. The  left ventricle has normal function. The left ventricle has no regional  wall motion abnormalities. There is moderate asymmetric left ventricular  hypertrophy of the septal segment.  Left ventricular diastolic parameters are indeterminate.  2. Right ventricular systolic function is low normal. The right  ventricular size is normal. There is normal pulmonary artery systolic  pressure.  3. The mitral valve is normal in structure. Trivial mitral valve  regurgitation.  4. The aortic valve was not well visualized. Aortic valve regurgitation  is not visualized. No aortic stenosis is present.  5. The inferior vena cava is normal in size with greater than 50%  respiratory variability, suggesting right atrial pressure of 3 mmHg.   Echo 12/04/19 1. Left ventricular ejection fraction, by estimation, is 55 to 60%. The  left ventricle has normal function. The left ventricle has no regional  wall motion abnormalities. Left ventricular diastolic parameters were  normal.  2. Right ventricular systolic function is normal. The right ventricular  size is normal. There is normal pulmonary artery systolic pressure.  3. The mitral valve is normal in structure. No evidence of mitral valve  regurgitation. No evidence of mitral stenosis.  4. The aortic valve is normal in structure. Aortic valve regurgitation is  not visualized. No aortic stenosis is present.  5. The inferior vena cava is normal in size with greater than 50%  respiratory variability, suggesting right atrial pressure of 3 mmHg.   CTA 04/28/2020 IMPRESSION: 1. No pulmonary embolism.  Study is mildly limited by bolus timing. 2. Bilateral ground-glass opacities throughout the  chest with peripheral predominance. Findings are consistent with the provided history of COVID-19 infection. 3. Mildly enlarged mediastinal and hilar lymph nodes, likely reactive. 4. Hepatic steatosis. 5. Aortic atherosclerosis.  Aortic Atherosclerosis (ICD10-I70.0).   Assessment & Plan    Chronic Atypical CP Coronary artery calcium by CT --CP improved with restart of beta-blocker but still present upon exertion or with stress.  Recent echo as above with normal EF and NRWMA.  Asymmetric hypertrophy of her LV septum not seen on most recent echo, which was seen on previous echo. MPI performed and ruled low risk as above.   We will increase metoprolol to tartrate to 25 mg twice daily today for antianginal effect and to further control her palpitations.  Ongoing risk factor modification recommended, given MPI does show moderate proximal to mid LAD disease by CT attenuation correction images.  Long discussion regarding lifestyle modifications and medical management today.  Racing HR, Palpitations --Improved with restart of beta-blocker.  We will increase current Lopressor to 25 mg twice daily as above.l.  HTN, BP goal <130/80 --BP today well controlled with restart of beta-blocker and DBP borderline in 80s. Increase current BB as above. Reviewed recommendations for total daily fluid and salt intake  Chronic HFpEF --Euvolemic and well compensated on exam.  Wt decreased from previous clinic.  Updated echo without previously seen LV asymmetric hypertrophy of the septal segment.  Continue current medications.  HLD, LDL goal <70 Hypertriglyceridemia  --Continue cholesterol medications.  Most recent LDL well controlled. Tg elevated and dietary changes reviewed. Could consider Vascepa in the future.   COPD --Consider as contributing to her sx.  She continues on nighttime home oxygen and does not need oxygen during the day.   OSA --Continue home O2 / BiPAP / CPAP as recommended per pulmonary.  Ongoing dietary and lifestyle recommendations / wt loss advised.   History of dizziness, heart beat in ear --Recent carotids without significant dz as above.   Medication changes: Increase to metoprolol tartrate 25 mg twice daily.  Labs ordered: None Studies / Imaging ordered: None Disposition: RTC 3 months   Arvil Chaco, PA-C 01/06/2021

## 2021-01-09 ENCOUNTER — Encounter: Payer: Self-pay | Admitting: Physician Assistant

## 2021-01-10 ENCOUNTER — Telehealth: Payer: Self-pay | Admitting: Physician Assistant

## 2021-01-10 NOTE — Telephone Encounter (Signed)
Spoke to pt. Reports HR has been running in 30s and 40s "mostly at night, since increasing Metoprolol."  Last ov 01/06/21 w/ Jacquelyn. At visit BP 120/80 HR 77. Metoprolol Tartrate was incr from 12.5 BID to 8m BID. Pt states she has not been checking BP since ov, only checking HR using pulse ox.   Had pt check BP/HR while on phone now:  BP 135/69 HR 90 Pt does confirm HR has been consistently 30s-40s at night since metoprolol incr on 5/5.   Pts vitals stable at this time and asymptomatic.  Advised pt check BP/HR prior to next dose this evening and hold metoprolol if HR 60 or less and/or SBP 100 or less.  Otherwise, notified I will forward to provider for further recc.  Pt verbalized understanding.

## 2021-01-10 NOTE — Telephone Encounter (Signed)
Pt c/o medication issue:  1. Name of Medication: metoprolol  2. How are you currently taking this medication (dosage and times per day)? 25 mg bid  3. Are you having a reaction (difficulty breathing--STAT)?   4. What is your medication issue? Dropping pulse rate (no reading available)

## 2021-01-12 NOTE — Telephone Encounter (Signed)
Called and left a VM for patient to call back.

## 2021-01-12 NOTE — Telephone Encounter (Signed)
Thank you for keeping me in the loop.  It is acceptable for HR to drop when sleeping.  If HR drops during waking hours with pt also reporting dizziness / presyncope, we will need to go back to her previous dose of BB (and if this HR is indeed accurate, as more often the HR on pulse ox readings can be inaccurate due to ectopy).  Agree with checking BP.   Her vitals on the phone make me think that her HR at night is not accurate due to ectopy.   If asymptomatic, let's continue her current dose for now.

## 2021-01-13 ENCOUNTER — Telehealth: Payer: Medicare Other

## 2021-01-13 ENCOUNTER — Telehealth: Payer: Self-pay

## 2021-01-13 NOTE — Telephone Encounter (Signed)
  Care Management   Follow Up Note   01/13/2021 Name: [name] MRN: [id number] DOB: 07/10/1966   Primary Care Provider: Steele Sizer, MD Reason for referral : Chronic Care Management   An unsuccessful telephone outreach was attempted today. [name] was referred to the care management team for assistance with care management and care coordination.    Follow Up Plan:  A HIPAA compliant voice message was left today requesting a return call. Will notify the Care Guide team to assist with rescheduling.    [facility] [phone]

## 2021-01-13 NOTE — Telephone Encounter (Signed)
Spoke to pt. Notified of provider's recc below.  Pt reports she has not had any other episodes of low HR since we last spoke.  She verbalized understanding to contact our office if recurrent low HR and symptomatic during waking hours.  Pt will continue to monitor BP/HR and continue current dose of Metoprolol Tartrate 51m BID.  Pt has no further questions at this time.

## 2021-01-14 ENCOUNTER — Other Ambulatory Visit: Payer: Self-pay | Admitting: Gastroenterology

## 2021-01-20 ENCOUNTER — Ambulatory Visit: Payer: Medicare Other

## 2021-01-24 ENCOUNTER — Telehealth: Payer: Self-pay | Admitting: Gastroenterology

## 2021-01-24 NOTE — Telephone Encounter (Signed)
Patient called wants medication to be changed from [place].  Patient states that it is not working "at all."

## 2021-01-25 ENCOUNTER — Other Ambulatory Visit: Payer: Self-pay

## 2021-01-25 ENCOUNTER — Emergency Department
Admission: EM | Admit: 2021-01-25 | Discharge: 2021-01-25 | Disposition: A | Discharge status: Home / Self Care | Payer: Medicare Other | Attending: Emergency Medicine | Admitting: Emergency Medicine

## 2021-01-25 DIAGNOSIS — Z79899 Other long term (current) drug therapy: Secondary | ICD-10-CM | POA: Insufficient documentation

## 2021-01-25 DIAGNOSIS — N183 Chronic kidney disease, stage 3 unspecified: Secondary | ICD-10-CM | POA: Diagnosis not present

## 2021-01-25 DIAGNOSIS — I130 Hypertensive heart and chronic kidney disease with heart failure and stage 1 through stage 4 chronic kidney disease, or unspecified chronic kidney disease: Secondary | ICD-10-CM | POA: Diagnosis not present

## 2021-01-25 DIAGNOSIS — E1122 Type 2 diabetes mellitus with diabetic chronic kidney disease: Secondary | ICD-10-CM | POA: Insufficient documentation

## 2021-01-25 DIAGNOSIS — E039 Hypothyroidism, unspecified: Secondary | ICD-10-CM | POA: Diagnosis not present

## 2021-01-25 DIAGNOSIS — I509 Heart failure, unspecified: Secondary | ICD-10-CM | POA: Insufficient documentation

## 2021-01-25 DIAGNOSIS — I2510 Atherosclerotic heart disease of native coronary artery without angina pectoris: Secondary | ICD-10-CM | POA: Insufficient documentation

## 2021-01-25 DIAGNOSIS — R300 Dysuria: Secondary | ICD-10-CM

## 2021-01-25 DIAGNOSIS — J449 Chronic obstructive pulmonary disease, unspecified: Secondary | ICD-10-CM | POA: Insufficient documentation

## 2021-01-25 DIAGNOSIS — R350 Frequency of micturition: Secondary | ICD-10-CM | POA: Diagnosis not present

## 2021-01-25 DIAGNOSIS — N390 Urinary tract infection, site not specified: Secondary | ICD-10-CM | POA: Insufficient documentation

## 2021-01-25 LAB — CBC WITH DIFFERENTIAL/PLATELET
Abs Immature Granulocytes: 0.05 10*3/uL (ref 0.00–0.07)
Basophils Absolute: 0 10*3/uL (ref 0.0–0.1)
Basophils Relative: 0 %
Eosinophils Absolute: 0.2 10*3/uL (ref 0.0–0.5)
Eosinophils Relative: 2 %
HCT: 40.5 % (ref 36.0–46.0)
Hemoglobin: 12.9 g/dL (ref 12.0–15.0)
Immature Granulocytes: 1 %
Lymphocytes Relative: 32 %
Lymphs Abs: 2.8 10*3/uL (ref 0.7–4.0)
MCH: 29.5 pg (ref 26.0–34.0)
MCHC: 31.9 g/dL (ref 30.0–36.0)
MCV: 92.7 fL (ref 80.0–100.0)
Monocytes Absolute: 0.5 10*3/uL (ref 0.1–1.0)
Monocytes Relative: 6 %
Neutro Abs: 5.3 10*3/uL (ref 1.7–7.7)
Neutrophils Relative %: 59 %
Platelets: 277 10*3/uL (ref 150–400)
RBC: 4.37 MIL/uL (ref 3.87–5.11)
RDW: 13.1 % (ref 11.5–15.5)
WBC: 9 10*3/uL (ref 4.0–10.5)
nRBC: 0 % (ref 0.0–0.2)

## 2021-01-25 LAB — URINALYSIS, COMPLETE (UACMP) WITH MICROSCOPIC
Bilirubin Urine: NEGATIVE
Glucose, UA: >=500 mg/dL — AB
Hgb urine dipstick: NEGATIVE
Ketones, ur: NEGATIVE mg/dL
Leukocytes,Ua: NEGATIVE
Nitrite: NEGATIVE
Protein, ur: NEGATIVE mg/dL
Specific Gravity, Urine: 1.027 (ref 1.005–1.030)
pH: 5 (ref 5.0–8.0)

## 2021-01-25 LAB — BASIC METABOLIC PANEL
Anion gap: 12 (ref 5–15)
BUN: 27 mg/dL — ABNORMAL HIGH (ref 6–20)
CO2: 27 mmol/L (ref 22–32)
Calcium: 13.2 mg/dL (ref 8.9–10.3)
Chloride: 101 mmol/L (ref 98–111)
Creatinine, Ser: 1.5 mg/dL — ABNORMAL HIGH (ref 0.44–1.00)
GFR, Estimated: 40 mL/min — ABNORMAL LOW (ref >60)
Glucose, Bld: 207 mg/dL — ABNORMAL HIGH (ref 70–99)
Potassium: 4.3 mmol/L (ref 3.5–5.1)
Sodium: 140 mmol/L (ref 135–145)

## 2021-01-25 MED ORDER — SODIUM CHLORIDE 0.9 % IV BOLUS
1000.0000 mL | Freq: Once | INTRAVENOUS | Status: AC
  Administered 2021-01-25: 1000 mL via INTRAVENOUS

## 2021-01-25 NOTE — ED Notes (Signed)
See triage note  Presents with urinary freq and burning   States sx's started about1 week ago  Afebrile on arrival

## 2021-01-25 NOTE — ED Provider Notes (Signed)
[facility] Emergency Department Provider Note   ____________________________________________   Event Date/Time   First MD Initiated Contact with Patient 01/25/21 1224     (approximate)  I have reviewed the triage vital signs and the nursing notes.   HISTORY  Chief Complaint UTI    HPI [name] is a 55 y.o. female patient presents with dysuria and frequency for 1 week.  Patient believes she has a UTI.  Patient denies vaginal discharge.  Patient also states she believes she may be dehydrated.  No fever associated with complaint.  No flank pain.  Patient rates her pain/discomfort as a 6/10.  Patient cannot give quality of pain.     Past Medical History:  Diagnosis Date  . Anxiety   . Arthritis   . Asthma   . Bipolar depression ([place])   . CHF (congestive heart failure) ([place])   . Chronic headaches    migraines have lessened  . Chronic kidney disease    ddstage 111  . COPD (chronic obstructive pulmonary disease) ([place])   . Coronary artery disease   . Depression   . Diabetic neuropathy ([place])   . Dyspnea    uses inhalers for this  . Dysrhythmia    rapid  . Fatty liver   . GERD (gastroesophageal reflux disease)   . Headache   . Heart attack ([place]) 2009  . Hyperlipidemia   . Hypertension   . Hypomagnesemia   . Hypothyroid   . Irregular heart rhythm   . Liver lesion    Favored to be benign on CT; MRI of abd w/contract in Aug 2016. Liver Biopsy-Negative, March 2016  . Morbid obesity ([place])   . OSA (obstructive sleep apnea)   . Peripheral vascular disease ([place])   . Pinched nerve    Back  . Pneumonia   . RLS (restless legs syndrome)   . Seasonal allergies   . Sleep apnea    uses oxygen at night 2L d/t cpap did not fit  . Type 2 diabetes, uncontrolled, with neuropathy ([place]) 10/15/2015   Overview:  Microalbumin 7.7 12/2010: Hgb A1c 10.2% 12/2010 and 9.7% 03/2011, eye exam 10/2009 [facility]   . Urinary incontinence   . Vitamin D deficiency      Patient Active Problem List   Diagnosis Date Noted  . Severe sepsis with septic shock ([place]) 11/30/2020  . Syncope 11/29/2020  . Chronic congestive heart failure ([place]) 11/04/2020  . Iron deficiency anemia due to chronic blood loss   . Polyp of ascending colon   . Chronic gastric ulcer with hemorrhage   . Vitamin B12 deficiency 08/08/2018  . Patient is full code 10/30/2017  . Coronary artery disease 10/09/2017  . Plantar fascial fibromatosis 10/09/2017  . Tendinitis of wrist 10/09/2017  . Schizoaffective disorder, bipolar type ([place]) 09/28/2017  . 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 Breast cancer screening 09/28/2016  . Acute lower UTI 03/14/2016  . Adenomatous colon polyp 10/15/2015  . Bipolar affective disorder ([place]) 10/15/2015  . CN (constipation) 10/15/2015  . Drug-induced hepatic toxicity 10/15/2015  . HLD (hyperlipidemia) 10/15/2015  . Hypoxemia 10/15/2015  . NASH (nonalcoholic steatohepatitis) 10/15/2015  . Morbid obesity ([place]) 10/15/2015  . Restless leg syndrome 10/15/2015  . Type 2 diabetes, uncontrolled, with neuropathy ([place]) 10/15/2015  . Elevated alkaline phosphatase level 09/22/2015  . Low back pain with left-sided sciatica 08/18/2015  . Apnea, sleep 05/05/2015  . Heart valve disease 05/05/2015  . Chronic  kidney disease, stage III (moderate) ([place]) 03/15/2015  . Migraine 03/12/2015  . COPD (chronic obstructive pulmonary disease) ([place]) 01/20/2015  . 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 GERD (gastroesophageal reflux disease) 01/20/2015  . Hypothyroid   . Vitamin D deficiency   . Urinary incontinence   . Liver lesion   . Hypomagnesemia   . Obstructive sleep apnea 10/20/2013  . PERIPHERAL NEUROPATHY 03/04/2009  . INTERSTITIAL CYSTITIS 07/04/2007  . Allergic rhinitis 07/04/2007    Past Surgical History:  Procedure Laterality Date  . ABDOMINAL HYSTERECTOMY     Partial  . bladder stim  2007  . BLADDER SURGERY     x 2.  tacking of bladder and the other was the stimulator placement  . CHOLECYSTECTOMY    . COLONOSCOPY WITH PROPOFOL N/A 03/04/2019   Procedure: COLONOSCOPY WITH PROPOFOL;  Surgeon: [name], MD;  Location: [facility] ENDOSCOPY;  Service: Endoscopy;  Laterality: N/A;  . ESOPHAGOGASTRODUODENOSCOPY (EGD) WITH PROPOFOL N/A 03/04/2019   Procedure: ESOPHAGOGASTRODUODENOSCOPY (EGD) WITH PROPOFOL;  Surgeon: [name], MD;  Location: [facility] ENDOSCOPY;  Service: Endoscopy;  Laterality: N/A;  . INCISION AND DRAINAGE ABSCESS Right 04/16/2019   Procedure: INCISION AND DRAINAGE ABSCESS;  Surgeon: Benjamine Sprague, DO;  Location: [facility] ORS;  Service: General;  Laterality: Right;  . INTERSTIM-GENERATOR CHANGE N/A 12/31/2017   Procedure: INTERSTIM-GENERATOR CHANGE;  Surgeon: Bjorn Loser, MD;  Location: [facility] ORS;  Service: Urology;  Laterality: N/A;  . LIVER BIOPSY  March 2016   Negative  . LUNG BIOPSY  2015   small spot on lung  . TUBAL LIGATION    . UPPER GI ENDOSCOPY    . WRIST SURGERY Right 11/02/2019    Prior to Admission medications   Medication Sig Start Date End Date Taking? Authorizing Provider  atorvastatin (LIPITOR) 80 MG tablet Take 80 mg by mouth at bedtime.     [provider]  busPIRone (BUSPAR) 30 MG tablet Take 30 mg by mouth 2 (two) times daily. 11/09/20   [provider]  citalopram (CELEXA) 20 MG tablet Take 20 mg by mouth daily. 01/16/20   Care, [place]  dexlansoprazole (DEXILANT) 60 MG capsule Take 1 capsule (60 mg total) by mouth daily. 10/21/20   [name], MD  ezetimibe (ZETIA) 10 MG tablet Take 10 mg by mouth at bedtime. 01/05/17   [provider]  fenofibrate micronized (LOFIBRA) 134 MG capsule Take 134 mg by mouth at bedtime.    [provider]  gabapentin (NEURONTIN) 300 MG capsule Take 300 mg by mouth 2 (two) times daily.    [provider]  gabapentin (NEURONTIN) 600 MG tablet Take 600 mg by mouth at bedtime. 06/15/20   [provider]  GLYXAMBI 25-5 MG TABS Take 1 tablet by mouth daily. 10/01/20   [provider]  insulin aspart (NOVOLOG FLEXPEN) 100 UNIT/ML FlexPen Inject 3 Units subcutaneously. Sliding scale.    [provider]  insulin regular human CONCENTRATED (HUMULIN R) 500 UNIT/ML injection Inject 70 Units into the skin 3 (three) times daily with meals.    [provider]  lamoTRIgine (LAMICTAL) 200 MG tablet Take 200 mg by mouth 2 (two) times daily.     [provider]  lisinopril (ZESTRIL) 2.5 MG tablet Take 1 tablet (2.5 mg total) by mouth daily. 11/04/20   Steele Sizer, MD  methocarbamol (ROBAXIN) 750 MG tablet methocarbamol 750 mg tablet  Take 1 tablet every 8 hours by oral route.    [provider]  metoprolol  tartrate (LOPRESSOR) 25 MG tablet Take 1 tablet (25 mg total) by mouth 2 (two) times daily. 01/06/21 04/06/21  Marrianne Mood D, PA-C  nitroGLYCERIN (NITROSTAT) 0.4 MG SL tablet Place 1 tablet (0.4 mg total) under the tongue every 5 (five) minutes as needed for chest pain. 10/13/20   Minna Merritts, MD  OneTouch Delica Lancets 86V MISC by Does not apply route.    [provider]  promethazine (PHENERGAN) 12.5 MG tablet TAKE 1 TABLET BY MOUTH EVERY 6 HOURS AS NEEDED FOR NAUSEA OR VOMITING 01/14/21   [name], MD  QUEtiapine (SEROQUEL) 200 MG tablet Take 200 mg by mouth at bedtime. Along with 382m dose for a total of 5040m   [provider]  QUEtiapine (SEROQUEL) 300 MG tablet Take 300 mg by mouth at bedtime. Along with 20053mose for a total of 500m68m22/22   [provider]  SUMAtriptan (IMITREX) 100 MG tablet Take 1 tablet (100 mg total) by mouth once for 1 dose. May repeat in 2 hours if headache persists or recurs. 12/27/20 12/27/20  SowlSteele Sizer  SYNTHROID 100 MCG tablet Take 100 mcg by mouth daily. 10/01/20   [provider]  TRULICITY 1.5 MG/0EH/2.0NON Inject 1.5 mg into the skin once a week.  (Wednesdays) 10/01/20   [provider]    Allergies Mirtazapine, Morphine and related, Sulfamethoxazole-trimethoprim, Vicodin hp [hydrocodone-acetaminophen], Amoxicillin er, Codeine, Hydrocodone-acetaminophen, Keflex [cephalexin], and Prasterone  Family History  Problem Relation Age of Onset  . Diabetes Mother   . Heart disease Mother   . Hyperlipidemia Mother   . Hypertension Mother   . Kidney disease Mother   . Mental illness Mother   . Hypothyroidism Mother   . Stroke Mother   . Osteoporosis Mother   . Glaucoma Mother   . Congestive Heart Failure Mother   . Hypertension Father   . Asthma Daughter   . Cancer Daughter        throat  . Bipolar disorder Daughter   . Heart disease Maternal Grandfather   . Rheum arthritis Maternal Grandfather   . Cancer Maternal Grandfather        liver  . Kidney disease Maternal Grandfather   . Cancer Paternal Grandmother        lung  . Kidney disease Brother   . Breast cancer Maternal Grandmother 60  . Bipolar disorder Daughter   . Hypertension Daughter   . Restless legs syndrome Daughter     Social History Social History   Tobacco Use  . Smoking status: Never Smoker  . Smokeless tobacco: Never Used  Vaping Use  . Vaping Use: Never used  Substance Use Topics  . Alcohol use: No  . Drug use: No    Review of Systems Constitutional: No fever/chills Eyes: No visual changes. ENT: No sore throat. Cardiovascular: Denies chest pain. Respiratory: Denies shortness of breath. Gastrointestinal: No abdominal pain.  No nausea, no vomiting.  No diarrhea.  No constipation. Genitourinary: Negative for dysuria. Musculoskeletal: Negative for back pain. Skin: Negative for rash. Neurological: Negative for headaches, focal weakness or numbness. Psychiatric:  Anxiety, bipolar, depression. Endocrine:  Diabetes, hyperlipidemia, hypertension, hypomagnesia, and hypothyroidism., Allergic/Immunilogical: See extensive allergy  list.  ____________________________________________   PHYSICAL EXAM:  VITAL SIGNS: ED Triage Vitals  Enc Vitals Group     BP 01/25/21 1108 (!) 127/94     Pulse Rate 01/25/21 1108 84     Resp 01/25/21 1108 18     Temp 01/25/21 1108 97.8  F (36.6 C)     Temp Source 01/25/21 1108 Oral     SpO2 01/25/21 1108 96 %     Weight 01/25/21 1131 195 lb 1.7 oz (88.5 kg)     Height 01/25/21 1131 4' 11"  (1.499 m)     Head Circumference --      Peak Flow --      Pain Score 01/25/21 1114 6     Pain Loc --      Pain Edu? --      Excl. in [place]? --     Constitutional: Alert and oriented. Well appearing and in no acute distress. Cardiovascular: Normal rate, regular rhythm. Grossly normal heart sounds.  Good peripheral circulation. Respiratory: Normal respiratory effort.  No retractions. Lungs CTAB. Gastrointestinal: Soft and nontender.  distention secondary to body habitus.. No abdominal bruits. No CVA tenderness. Genitourinary: Deferred Musculoskeletal: No lower extremity tenderness nor edema.  No joint effusions. Neurologic:  Normal speech and language. No gross focal neurologic deficits are appreciated. No gait instability. Skin:  Skin is warm, dry and intact. No rash noted. Psychiatric: Mood and affect are normal. Speech and behavior are normal.  ____________________________________________   LABS (all labs ordered are listed, but only abnormal results are displayed)  Labs Reviewed  URINALYSIS, COMPLETE (UACMP) WITH MICROSCOPIC - Abnormal; Notable for the following components:      Result Value   Color, Urine YELLOW (*)    APPearance CLEAR (*)    Glucose, UA >=500 (*)    Bacteria, UA RARE (*)    All other components within normal limits  BASIC METABOLIC PANEL - Abnormal; Notable for the following components:   Glucose, Bld 207 (*)    BUN 27 (*)    Creatinine, Ser 1.50 (*)    Calcium 13.2 (*)    GFR, Estimated 40 (*)    All other components within normal limits  CBC WITH  DIFFERENTIAL/PLATELET  CBG MONITORING, ED   ____________________________________________  EKG   ____________________________________________  RADIOLOGY I, Sable Feil, personally viewed and evaluated these images (plain radiographs) as part of my medical decision making, as well as reviewing the written report by the radiologist.  ED MD interpretation:    Official radiology report(s): No results found.  ____________________________________________   PROCEDURES  Procedure(s) performed (including Critical Care):  Procedures   ____________________________________________   INITIAL IMPRESSION / ASSESSMENT AND PLAN / ED COURSE  As part of my medical decision making, I reviewed the following data within the [place]         Patient presents with complaint of UTI signs symptoms consistent mostly of dysuria and frequency.  Urinalysis remarkable greater than 500 glucose.  Patient was negative for ketones.  Patient was hydrated with 1000 cc of normal saline resulting glucose decreased to 207.  Patient was reassured she does not have a UTI and advised to follow-up with PCP.  Advised to continue previous medications.      ____________________________________________   FINAL CLINICAL IMPRESSION(S) / ED DIAGNOSES  Final diagnoses:  Urinary frequency  Dysuria     ED Discharge Orders    None      *Please note:  [name] was evaluated in Emergency Department on 01/25/2021 for the symptoms described in the history of present illness. She was evaluated in the context of the global COVID-19 pandemic, which necessitated consideration that the patient might be at risk for infection with the SARS-CoV-2 virus that causes COVID-19. Institutional protocols and algorithms that pertain  to the evaluation of patients at risk for COVID-19 are in a state of rapid change based on information released by regulatory bodies including the CDC and federal and state  organizations. These policies and algorithms were followed during the patient's care in the ED.  Some ED evaluations and interventions may be delayed as a result of limited staffing during and the pandemic.*   Note:  This document was prepared using Dragon voice recognition software and may include unintentional dictation errors.    Sable Feil, PA-C 01/25/21 1346    Carrie Mew, MD 01/25/21 1515

## 2021-01-25 NOTE — ED Notes (Signed)
Sent urine, green, and purple top tubes to lab.

## 2021-01-25 NOTE — Discharge Instructions (Addendum)
Your urinalysis was unremarkable.  Read and follow discharge care instructions as they can be many reasons for having dysuria.  Follow-up with PCP if in 1 to 2 weeks.  Continue previous medications.

## 2021-01-25 NOTE — Progress Notes (Signed)
Prescription for Nexium has been sent to the pt's pharmacy. Left a voicemail letting pt know this was sent.

## 2021-01-25 NOTE — ED Triage Notes (Signed)
Pt comes with c/o UTI. Pt states symptoms for over week.

## 2021-01-27 ENCOUNTER — Ambulatory Visit (INDEPENDENT_AMBULATORY_CARE_PROVIDER_SITE_OTHER): Payer: Medicare Other

## 2021-01-27 DIAGNOSIS — E1165 Type 2 diabetes mellitus with hyperglycemia: Secondary | ICD-10-CM | POA: Diagnosis not present

## 2021-01-27 DIAGNOSIS — J449 Chronic obstructive pulmonary disease, unspecified: Secondary | ICD-10-CM

## 2021-01-27 DIAGNOSIS — IMO0002 Reserved for concepts with insufficient information to code with codable children: Secondary | ICD-10-CM

## 2021-01-27 DIAGNOSIS — E1140 Type 2 diabetes mellitus with diabetic neuropathy, unspecified: Secondary | ICD-10-CM | POA: Diagnosis not present

## 2021-01-27 DIAGNOSIS — I509 Heart failure, unspecified: Secondary | ICD-10-CM

## 2021-01-27 NOTE — Chronic Care Management (AMB) (Signed)
Chronic Care Management   Initial Visit Note  01/27/2021 Name: [name] MRN: [id number] DOB: 08-12-1966  Primary Care Provider: Steele Sizer, MD Reason for referral : Chronic Care Management  [name] is a 55 y.o. year old female who is a primary care patient of Steele Sizer, MD. The CCM team was consulted for assistance with chronic disease management and care coordination. A telephonic outreach was conducted today.  Review of Mrs. Kehm's status, including review of consultants reports, relevant labs and test results was conducted today. Collaboration with appropriate care team members was performed as part of the comprehensive evaluation and provision of chronic care management services.    SDOH (Social Determinants of Health) assessments performed: Yes See Care Plan activities for detailed interventions related to SDOH  SDOH Interventions   Flowsheet Row Most Recent Value  SDOH Interventions   Food Insecurity Interventions Intervention Not Indicated  Financial Strain Interventions Intervention Not Indicated  Transportation Interventions Intervention Not Indicated       Medications: Outpatient Encounter Medications as of 01/27/2021  Medication Sig  . atorvastatin (LIPITOR) 80 MG tablet Take 80 mg by mouth at bedtime.   . busPIRone (BUSPAR) 30 MG tablet Take 30 mg by mouth 2 (two) times daily.  . citalopram (CELEXA) 20 MG tablet Take 20 mg by mouth daily.  Marland Kitchen dexlansoprazole (DEXILANT) 60 MG capsule Take 1 capsule (60 mg total) by mouth daily.  Marland Kitchen ezetimibe (ZETIA) 10 MG tablet Take 10 mg by mouth at bedtime.  . fenofibrate micronized (LOFIBRA) 134 MG capsule Take 134 mg by mouth at bedtime.  . gabapentin (NEURONTIN) 300 MG capsule Take 300 mg by mouth 2 (two) times daily.  Marland Kitchen gabapentin (NEURONTIN) 600 MG tablet Take 600 mg by mouth at bedtime.  Marland Kitchen GLYXAMBI 25-5 MG TABS Take 1 tablet by mouth daily.  . insulin aspart (NOVOLOG FLEXPEN) 100 UNIT/ML FlexPen Inject 3  Units subcutaneously. Sliding scale.  . insulin regular human CONCENTRATED (HUMULIN R) 500 UNIT/ML injection Inject 70 Units into the skin 3 (three) times daily with meals.  . lamoTRIgine (LAMICTAL) 200 MG tablet Take 200 mg by mouth 2 (two) times daily.   Marland Kitchen lisinopril (ZESTRIL) 2.5 MG tablet Take 1 tablet (2.5 mg total) by mouth daily.  . methocarbamol (ROBAXIN) 750 MG tablet methocarbamol 750 mg tablet  Take 1 tablet every 8 hours by oral route.  . metoprolol tartrate (LOPRESSOR) 25 MG tablet Take 1 tablet (25 mg total) by mouth 2 (two) times daily.  . nitroGLYCERIN (NITROSTAT) 0.4 MG SL tablet Place 1 tablet (0.4 mg total) under the tongue every 5 (five) minutes as needed for chest pain.  Glory Rosebush Delica Lancets 63W MISC by Does not apply route.  . promethazine (PHENERGAN) 12.5 MG tablet TAKE 1 TABLET BY MOUTH EVERY 6 HOURS AS NEEDED FOR NAUSEA OR VOMITING  . QUEtiapine (SEROQUEL) 200 MG tablet Take 200 mg by mouth at bedtime. Along with 375m dose for a total of 5049m . QUEtiapine (SEROQUEL) 300 MG tablet Take 300 mg by mouth at bedtime. Along with 20058mose for a total of 500m69m SUMAtriptan (IMITREX) 100 MG tablet Take 1 tablet (100 mg total) by mouth once for 1 dose. May repeat in 2 hours if headache persists or recurs.  . SYMarland KitchenTHROID 100 MCG tablet Take 100 mcg by mouth daily.  . TRULICITY 1.5 MG/0GY/6.5LDN Inject 1.5 mg into the skin once a week. (Wednesdays)   No facility-administered encounter medications on file as of  01/27/2021.     Objective:  Patient Care Plan: Heart Failure (Adult)    Problem Identified: Disease Progression (Heart Failure)     Long-Range Goal: Symptom Exacerbation Prevented or Minimized   Start Date: 01/27/2021  Expected End Date: 05/27/2021  Priority: High  Note:   Wt Readings from Last 3 Encounters:  01/25/21 195 lb 1.7 oz (88.5 kg)  01/06/21 195 lb (88.5 kg)  12/27/20 195 lb (88.5 kg)     Current Barriers:  . Chronic Disease Management support  and educational needs r/t CHF.  Case Manager Clinical Goal(s):  Marland Kitchen Over the next 120 days, patient will not require hospitalization or emergent care d/t complications r/t CHF exacerbation.   Interventions:  . Collaboration with Steele Sizer, MD regarding development and update of comprehensive plan of care as evidenced by provider attestation and co-signature . Inter-disciplinary care team collaboration (see longitudinal plan of care) . Provided information regarding recommended weight parameters. Encouraged to weigh daily and record readings. Encouraged to notify provider for weight gain greater than 3 lbs overnight or weight gain greater than 5 lbs within a week. Reports weights have remained stable. . Discussed nutritional intake.  Advised to closely monitor sodium consumption and avoid highly processed foods when possible.  . Discussed s/sx of fluid overload and indications for notifying a provider. Denies increased abdominal or lower extremity edema Notes previous episodes of chest discomfort but reports it was d/t acid reflux. Experiences exertional dyspnea. Denies shortness of breath at rest. Attempting to walk at least 2000 steps a day. Denies recent changes or decline in activity tolerance. . Reviewed worsening s/sx related to CHF exacerbation and indications for seeking immediate medical attention.   Patient Goals/Self-Care Activities:  -Take all medications as prescribed -Monitor weight and record readings -Adhere to recommended cardiac prudent/heart healthy diet -Notify provider for weight gain outside of established parameters -Contact provider or care management team with questions and new concerns as needed.   Follow Up Plan:  Will follow up next month      Patient Care Plan: COPD (Adult)    Problem Identified: Symptom Exacerbation (COPD)     Long-Range Goal: Symptom Exacerbation Prevented or Minimized   Start Date: 01/27/2021  Expected End Date: 05/27/2021   Priority: High  Note:    Current Barriers:  . Chronic Disease Management support and educational needs r/t COPD  Case Manager Clinical Goal(s): Marland Kitchen Over the next 120 days, patient will not require hospitalization for complications r/t COPD exacerbation   Interventions:  . Collaboration with Steele Sizer, MD regarding development and update of comprehensive plan of care as evidenced by provider attestation and co-signature . Inter-disciplinary care team collaboration (see longitudinal plan of care) . Reviewed medications. Reports compliance with prescribed inhalers. Encouraged to continue taking medications and utilizing inhalers as prescribed. Encouraged to notify the care management team with concerns regarding prescription cost. . Provided education regarding self-management and exacerbation prevention. Discussed COPD triggers. Encouraged to take needed precautions and avoid extreme temperatures to prevent exacerbation. . Discussed current action plan and reinforced importance of daily self assessment. Advised to make an appointment if symptoms persist for 48 hours without improvement. Reviewed worsening symptoms that require immediate medical attention.  . Provided information regarding infection prevention and increased risk r/t COPD.  Advised to utilize prevention strategies to reduce risk of respiratory infection.   Self-Care Activities/Patient Goals: -Take medications and utilize inhalers as prescribed -Assess symptoms daily and notify provider if symptoms persist.  -Avoid extreme temperatures and strenuous activities. -  Follow recommendations to prevent respiratory infection -Notify provider or care management team with questions and new concerns as needed    Follow Up Plan:  Will follow up next month      Patient Care Plan: Diabetes Type 2 (Adult)    Problem Identified: Disease Progression (Diabetes, Type 2)     Long-Range Goal: Disease Progression Prevented or  Minimized   Start Date: 01/27/2021  Expected End Date: 05/27/2021  Priority: High  Note:   Objective:  Lab Results  Component Value Date   HGBA1C 8.7 10/26/2020 .   Lab Results  Component Value Date   CREATININE 1.50 (H) 01/25/2021   CREATININE 0.96 12/03/2020   CREATININE 0.93 12/02/2020 .   Marland Kitchen No results found for: EGFR   Current Barriers:  . Chronic disease management support and educational needs related to Diabetes self-management   Case Manager Clinical Goal(s):  Marland Kitchen Over the next 120 days, patient will demonstrate improved adherence to prescribed treatment plan for Diabetes management as evidenced by taking medications as prescribed, monitoring and recording CBGs, and adherence to an ADA/ carb modified diet.   Interventions:  . Collaboration with Steele Sizer, MD regarding development and update of comprehensive plan of care as evidenced by provider attestation and co-signature . Inter-disciplinary care team collaboration (see longitudinal plan of care) . Reviewed medications. Encouraged to take medications as prescribed and notify team with concerns regarding prescription cost. . Provided information regarding importance of consistent blood glucose monitoring. Encouraged to monitor and maintain a log. Reports fasting reading today was 168 mg/dl. . Reviewed s/sx of hypoglycemia and hyperglycemia along with recommended interventions. . Discussed nutritional intake and compliance with a diabetic/carb modified diet. Discussed and offered referrals for available Diabetes education classes. Notes an increased consumption of sweets and snacks. Discussed diabetic/carb modified food options. Agreeable to considering nutritional and diabetes education classes if her A1C does not improve.  . Discussed importance of completing recommended DM preventive care. Reports completing recommended foot care. Unable to recall exact date of last eye exam but recalls completing the exam with Dr.  Marcha Dutton.   Patient Goals/Self-Care Activities -Self-administer medications as prescribed -Attend all scheduled provider appointments -Monitor blood glucose levels consistently and utilize recommended interventions -Adhere to prescribed ADA/carb modified -Notify provider or care management team with questions and new concerns as needed   Follow Up Plan:  Will follow up next month       Ms. Bhandari was given information about Chronic Care Management services including:  1. CCM service includes personalized support from designated clinical staff supervised by her physician, including individualized plan of care and coordination with other care providers 2. 24/7 contact phone numbers for assistance for urgent and routine care needs. 3. Service will only be billed when office clinical staff spend 20 minutes or more in a month to coordinate care. 4. Only one practitioner may furnish and bill the service in a calendar month. 5. The patient may stop CCM services at any time (effective at the end of the month) by phone call to the office staff. 6. The patient will be responsible for cost sharing (co-pay) of up to 20% of the service fee (after annual deductible is met).  Patient agreed to services and verbal consent obtained.      PLAN A member of the care management team will follow up with Mrs. Kawabata next month.    [facility] [phone]

## 2021-02-01 ENCOUNTER — Ambulatory Visit (INDEPENDENT_AMBULATORY_CARE_PROVIDER_SITE_OTHER): Payer: Medicare Other

## 2021-02-01 DIAGNOSIS — Z0000 Encounter for general adult medical examination without abnormal findings: Secondary | ICD-10-CM

## 2021-02-01 NOTE — Patient Instructions (Signed)
[name] , Thank you for taking time to come for your Medicare Wellness Visit. I appreciate your ongoing commitment to your health goals. Please review the following plan we discussed and let me know if I can assist you in the future.   Screening recommendations/referrals: Colonoscopy: done 03/04/19. Repeat in 2025 Mammogram: done 11/05/17. Please call [phone] to schedule your mammogram. Bone Density: due age 55 Recommended yearly ophthalmology/optometry visit for glaucoma screening and checkup Recommended yearly dental visit for hygiene and checkup  Vaccinations: Influenza vaccine: done 06/15/20 Pneumococcal vaccine: done 05/09/13 Tdap vaccine: done 11/09/20 Shingles vaccine: Shingrix discussed. Please contact your pharmacy for coverage information.  Covid-19:  Declined  Advanced directives: Advance directive discussed with you today. Even though you declined this today please call our office should you change your mind and we can give you the proper paperwork for you to fill out.  Conditions/risks identified: Recommend increasing physical activity as tolerated  Next appointment: Follow up in one year for your annual wellness visit.   Preventive Care 40-64 Years, Female Preventive care refers to lifestyle choices and visits with your health care provider that can promote health and wellness. What does preventive care include?  A yearly physical exam. This is also called an annual well check.  Dental exams once or twice a year.  Routine eye exams. Ask your health care provider how often you should have your eyes checked.  Personal lifestyle choices, including:  Daily care of your teeth and gums.  Regular physical activity.  Eating a healthy diet.  Avoiding tobacco and drug use.  Limiting alcohol use.  Practicing safe sex.  Taking low-dose aspirin daily starting at age 46.  Taking vitamin and mineral supplements as recommended by your health care provider. What  happens during an annual well check? The services and screenings done by your health care provider during your annual well check will depend on your age, overall health, lifestyle risk factors, and family history of disease. Counseling  Your health care provider may ask you questions about your:  Alcohol use.  Tobacco use.  Drug use.  Emotional well-being.  Home and relationship well-being.  Sexual activity.  Eating habits.  Work and work Statistician.  Method of birth control.  Menstrual cycle.  Pregnancy history. Screening  You may have the following tests or measurements:  Height, weight, and BMI.  Blood pressure.  Lipid and cholesterol levels. These may be checked every 5 years, or more frequently if you are over 22 years old.  Skin check.  Lung cancer screening. You may have this screening every year starting at age 60 if you have a 30-pack-year history of smoking and currently smoke or have quit within the past 15 years.  Fecal occult blood test (FOBT) of the stool. You may have this test every year starting at age 60.  Flexible sigmoidoscopy or colonoscopy. You may have a sigmoidoscopy every 5 years or a colonoscopy every 10 years starting at age 48.  Hepatitis C blood test.  Hepatitis B blood test.  Sexually transmitted disease (STD) testing.  Diabetes screening. This is done by checking your blood sugar (glucose) after you have not eaten for a while (fasting). You may have this done every 1-3 years.  Mammogram. This may be done every 1-2 years. Talk to your health care provider about when you should start having regular mammograms. This may depend on whether you have a family history of breast cancer.  BRCA-related cancer screening. This may be done if you  have a family history of breast, ovarian, tubal, or peritoneal cancers.  Pelvic exam and Pap test. This may be done every 3 years starting at age 61. Starting at age 64, this may be done every 5 years  if you have a Pap test in combination with an HPV test.  Bone density scan. This is done to screen for osteoporosis. You may have this scan if you are at high risk for osteoporosis. Discuss your test results, treatment options, and if necessary, the need for more tests with your health care provider. Vaccines  Your health care provider may recommend certain vaccines, such as:  Influenza vaccine. This is recommended every year.  Tetanus, diphtheria, and acellular pertussis (Tdap, Td) vaccine. You may need a Td booster every 10 years.  Zoster vaccine. You may need this after age 27.  Pneumococcal 13-valent conjugate (PCV13) vaccine. You may need this if you have certain conditions and were not previously vaccinated.  Pneumococcal polysaccharide (PPSV23) vaccine. You may need one or two doses if you smoke cigarettes or if you have certain conditions. Talk to your health care provider about which screenings and vaccines you need and how often you need them. This information is not intended to replace advice given to you by your health care provider. Make sure you discuss any questions you have with your health care provider. Document Released: 09/17/2015 Document Revised: 05/10/2016 Document Reviewed: 06/22/2015 Elsevier Interactive Patient Education  2017 [place] Prevention in the Home Falls can cause injuries. They can happen to people of all ages. There are many things you can do to make your home safe and to help prevent falls. What can I do on the outside of my home?  Regularly fix the edges of walkways and driveways and fix any cracks.  Remove anything that might make you trip as you walk through a door, such as a raised step or threshold.  Trim any bushes or trees on the path to your home.  Use bright outdoor lighting.  Clear any walking paths of anything that might make someone trip, such as rocks or tools.  Regularly check to see if handrails are loose or  broken. Make sure that both sides of any steps have handrails.  Any raised decks and porches should have guardrails on the edges.  Have any leaves, snow, or ice cleared regularly.  Use sand or salt on walking paths during winter.  Clean up any spills in your garage right away. This includes oil or grease spills. What can I do in the bathroom?  Use night lights.  Install grab bars by the toilet and in the tub and shower. Do not use towel bars as grab bars.  Use non-skid mats or decals in the tub or shower.  If you need to sit down in the shower, use a plastic, non-slip stool.  Keep the floor dry. Clean up any water that spills on the floor as soon as it happens.  Remove soap buildup in the tub or shower regularly.  Attach bath mats securely with double-sided non-slip rug tape.  Do not have throw rugs and other things on the floor that can make you trip. What can I do in the bedroom?  Use night lights.  Make sure that you have a light by your bed that is easy to reach.  Do not use any sheets or blankets that are too big for your bed. They should not hang down onto the floor.  Have  a firm chair that has side arms. You can use this for support while you get dressed.  Do not have throw rugs and other things on the floor that can make you trip. What can I do in the kitchen?  Clean up any spills right away.  Avoid walking on wet floors.  Keep items that you use a lot in easy-to-reach places.  If you need to reach something above you, use a strong step stool that has a grab bar.  Keep electrical cords out of the way.  Do not use floor polish or wax that makes floors slippery. If you must use wax, use non-skid floor wax.  Do not have throw rugs and other things on the floor that can make you trip. What can I do with my stairs?  Do not leave any items on the stairs.  Make sure that there are handrails on both sides of the stairs and use them. Fix handrails that are  broken or loose. Make sure that handrails are as long as the stairways.  Check any carpeting to make sure that it is firmly attached to the stairs. Fix any carpet that is loose or worn.  Avoid having throw rugs at the top or bottom of the stairs. If you do have throw rugs, attach them to the floor with carpet tape.  Make sure that you have a light switch at the top of the stairs and the bottom of the stairs. If you do not have them, ask someone to add them for you. What else can I do to help prevent falls?  Wear shoes that:  Do not have high heels.  Have rubber bottoms.  Are comfortable and fit you well.  Are closed at the toe. Do not wear sandals.  If you use a stepladder:  Make sure that it is fully opened. Do not climb a closed stepladder.  Make sure that both sides of the stepladder are locked into place.  Ask someone to hold it for you, if possible.  Clearly mark and make sure that you can see:  Any grab bars or handrails.  First and last steps.  Where the edge of each step is.  Use tools that help you move around (mobility aids) if they are needed. These include:  Canes.  Walkers.  Scooters.  Crutches.  Turn on the lights when you go into a dark area. Replace any light bulbs as soon as they burn out.  Set up your furniture so you have a clear path. Avoid moving your furniture around.  If any of your floors are uneven, fix them.  If there are any pets around you, be aware of where they are.  Review your medicines with your doctor. Some medicines can make you feel dizzy. This can increase your chance of falling. Ask your doctor what other things that you can do to help prevent falls. This information is not intended to replace advice given to you by your health care provider. Make sure you discuss any questions you have with your health care provider. Document Released: 06/17/2009 Document Revised: 01/27/2016 Document Reviewed: 09/25/2014 Elsevier  Interactive Patient Education  2017 [employer].

## 2021-02-01 NOTE — Progress Notes (Signed)
Subjective:   [name] is a 55 y.o. female who presents for Medicare Annual (Subsequent) preventive examination.  Virtual Visit via Telephone Note  I connected with  [name] on 02/01/21 at  2:10 PM EDT by telephone and verified that I am speaking with the correct person using two identifiers.  Location: Patient: home Provider: [place] Persons participating in the virtual visit: [place]   I discussed the limitations, risks, security and privacy concerns of performing an evaluation and management service by telephone and the availability of in person appointments. The patient expressed understanding and agreed to proceed.  Interactive audio and video telecommunications were attempted between this nurse and patient, however failed, due to patient having technical difficulties OR patient did not have access to video capability.  We continued and completed visit with audio only.  Some vital signs may be absent or patient reported.   Clemetine Marker, LPN    Review of Systems     Cardiac Risk Factors include: diabetes mellitus;dyslipidemia;hypertension;sedentary lifestyle;obesity (BMI >30kg/m2)     Objective:    There were no vitals filed for this visit. There is no height or weight on file to calculate BMI.  Advanced Directives 02/01/2021 01/25/2021 11/29/2020 11/09/2020 09/16/2020 04/28/2020 03/31/2020  Does Patient Have a Medical Advance Directive? No No No No No No No  Would patient like information on creating a medical advance directive? No - Patient declined - No - Patient declined - No - Patient declined - No - Patient declined    Current Medications (verified) Outpatient Encounter Medications as of 02/01/2021  Medication Sig  . atorvastatin (LIPITOR) 80 MG tablet Take 80 mg by mouth at bedtime.   . busPIRone (BUSPAR) 30 MG tablet Take 30 mg by mouth 2 (two) times daily.  . citalopram (CELEXA) 20 MG tablet Take 20 mg by mouth daily.  Marland Kitchen dexlansoprazole  (DEXILANT) 60 MG capsule Take 1 capsule (60 mg total) by mouth daily.  Marland Kitchen ezetimibe (ZETIA) 10 MG tablet Take 10 mg by mouth at bedtime.  . fenofibrate micronized (LOFIBRA) 134 MG capsule Take 134 mg by mouth at bedtime.  . gabapentin (NEURONTIN) 300 MG capsule Take 300 mg by mouth 2 (two) times daily.  Marland Kitchen gabapentin (NEURONTIN) 600 MG tablet Take 600 mg by mouth at bedtime.  Marland Kitchen GLYXAMBI 25-5 MG TABS Take 1 tablet by mouth daily.  Marland Kitchen HUMULIN R U-500 KWIKPEN 500 UNIT/ML kwikpen Inject 70 Units into the skin 3 (three) times daily with meals.  . insulin aspart (NOVOLOG FLEXPEN) 100 UNIT/ML FlexPen Inject 3 Units subcutaneously. Sliding scale.  . lamoTRIgine (LAMICTAL) 200 MG tablet Take 200 mg by mouth 2 (two) times daily.   Marland Kitchen lisinopril (ZESTRIL) 2.5 MG tablet Take 1 tablet (2.5 mg total) by mouth daily.  . methocarbamol (ROBAXIN) 750 MG tablet methocarbamol 750 mg tablet  Take 1 tablet every 8 hours by oral route.  . metoprolol tartrate (LOPRESSOR) 25 MG tablet Take 1 tablet (25 mg total) by mouth 2 (two) times daily.  . nitroGLYCERIN (NITROSTAT) 0.4 MG SL tablet Place 1 tablet (0.4 mg total) under the tongue every 5 (five) minutes as needed for chest pain.  Glory Rosebush Delica Lancets 03E MISC by Does not apply route.  . promethazine (PHENERGAN) 12.5 MG tablet TAKE 1 TABLET BY MOUTH EVERY 6 HOURS AS NEEDED FOR NAUSEA OR VOMITING  . QUEtiapine (SEROQUEL) 200 MG tablet Take 200 mg by mouth at bedtime. Along with 354m dose for a total of 5026m .  QUEtiapine (SEROQUEL) 300 MG tablet Take 300 mg by mouth at bedtime. Along with 231m dose for a total of 50107m . SYNTHROID 100 MCG tablet Take 100 mcg by mouth daily.  . TRULICITY 1.5 MGNL/9.7QBOPN Inject 1.5 mg into the skin once a week. (Wednesdays)  . SUMAtriptan (IMITREX) 100 MG tablet Take 1 tablet (100 mg total) by mouth once for 1 dose. May repeat in 2 hours if headache persists or recurs.  . [DISCONTINUED] insulin regular human CONCENTRATED (HUMULIN  R) 500 UNIT/ML injection Inject 70 Units into the skin 3 (three) times daily with meals.   No facility-administered encounter medications on file as of 02/01/2021.    Allergies (verified) Mirtazapine, Morphine and related, Sulfamethoxazole-trimethoprim, Vicodin hp [hydrocodone-acetaminophen], Amoxicillin er, Codeine, Hydrocodone-acetaminophen, Keflex [cephalexin], and Prasterone   History: Past Medical History:  Diagnosis Date  . Anxiety   . Arthritis   . Asthma   . Bipolar depression (HCP[place]  . CHF (congestive heart failure) (HCFair[place]  . Chronic headaches    migraines have lessened  . Chronic kidney disease    ddstage 111  . COPD (chronic obstructive pulmonary disease) (HCTyler [place]  . Coronary artery disease   . Depression   . Diabetic neuropathy (HCSpring [place]  . Dyspnea    uses inhalers for this  . Dysrhythmia    rapid  . Fatty liver   . GERD (gastroesophageal reflux disease)   . Headache   . Heart attack (HCLufkin2009  . Hyperlipidemia   . Hypertension   . Hypomagnesemia   . Hypothyroid   . Irregular heart rhythm   . Liver lesion    Favored to be benign on CT; MRI of abd w/contract in Aug 2016. Liver Biopsy-Negative, March 2016  . Morbid obesity (HCTippecan[place]  . OSA (obstructive sleep apnea)   . Peripheral vascular disease (HCMerrif[place]  . Pinched nerve    Back  . Pneumonia   . RLS (restless legs syndrome)   . Seasonal allergies   . Sleep apnea    uses oxygen at night 2L d/t cpap did not fit  . Type 2 diabetes, uncontrolled, with neuropathy (HCNorthmoor2/06/2016   Overview:  Microalbumin 7.7 12/2010: Hgb A1c 10.2% 12/2010 and 9.7% 03/2011, [facility]   . Urinary incontinence   . Vitamin D deficiency    Past Surgical History:  Procedure Laterality Date  . ABDOMINAL HYSTERECTOMY     Partial  . bladder stim  2007  . BLADDER SURGERY     x 2. tacking of bladder and the other was the stimulator placement  . CHOLECYSTECTOMY    . COLONOSCOPY WITH PROPOFOL N/A 03/04/2019   Procedure:  COLONOSCOPY WITH PROPOFOL;  Surgeon: WoLucilla LameMD;  [facility];  Service: Endoscopy;  Laterality: N/A;  . ESOPHAGOGASTRODUODENOSCOPY (EGD) WITH PROPOFOL N/A 03/04/2019   Procedure: ESOPHAGOGASTRODUODENOSCOPY (EGD) WITH PROPOFOL;  Surgeon: WoLucilla LameMD;  Location: [facility] ENDOSCOPY;  Service: Endoscopy;  Laterality: N/A;  . INCISION AND DRAINAGE ABSCESS Right 04/16/2019   Procedure: INCISION AND DRAINAGE ABSCESS;  Surgeon: SaBenjamine SpragueDO;  Location: [facility] ORS;  Service: General;  Laterality: Right;  . INTERSTIM-GENERATOR CHANGE N/A 12/31/2017   Procedure: INTERSTIM-GENERATOR CHANGE;  Surgeon: MaBjorn LoserMD;  Location: [facility] ORS;  Service: Urology;  Laterality: N/A;  . LIVER BIOPSY  March 2016   Negative  . LUNG BIOPSY  2015   small spot on lung  . TUBAL LIGATION    . UPPER GI ENDOSCOPY    .  WRIST SURGERY Right 11/02/2019   Family History  Problem Relation Age of Onset  . Diabetes Mother   . Heart disease Mother   . Hyperlipidemia Mother   . Hypertension Mother   . Kidney disease Mother   . Mental illness Mother   . Hypothyroidism Mother   . Stroke Mother   . Osteoporosis Mother   . Glaucoma Mother   . Congestive Heart Failure Mother   . Hypertension Father   . Asthma Daughter   . Cancer Daughter        throat  . Bipolar disorder Daughter   . Heart disease Maternal Grandfather   . Rheum arthritis Maternal Grandfather   . Cancer Maternal Grandfather        liver  . Kidney disease Maternal Grandfather   . Cancer Paternal Grandmother        lung  . Kidney disease Brother   . Breast cancer Maternal Grandmother 60  . Bipolar disorder Daughter   . Hypertension Daughter   . Restless legs syndrome Daughter    Social History   Socioeconomic History  . Marital status: Married    Spouse name: Not on file  . Number of children: 2  . Years of education: 15  . Highest education level: Not on file  Occupational History  . Occupation: disabled  Tobacco  Use  . Smoking status: Never Smoker  . Smokeless tobacco: Never Used  Vaping Use  . Vaping Use: Never used  Substance and Sexual Activity  . Alcohol use: No  . Drug use: No  . Sexual activity: Yes    Partners: Male    Birth control/protection: Surgical  Other Topics Concern  . Not on file  Social History Narrative   Pt lives with husband and grandchildren   Social Determinants of Health   Financial Resource Strain: Low Risk   . Difficulty of Paying Living Expenses: Not hard at all  Food Insecurity: No Food Insecurity  . Worried About Charity fundraiser in the Last Year: Never true  . Ran Out of Food in the Last Year: Never true  Transportation Needs: No Transportation Needs  . Lack of Transportation (Medical): No  . Lack of Transportation (Non-Medical): No  Physical Activity: Inactive  . Days of Exercise per Week: 0 days  . Minutes of Exercise per Session: 0 min  Stress: Stress Concern Present  . Feeling of Stress : Rather much  Social Connections: Moderately Isolated  . Frequency of Communication with Friends and Family: More than three times a week  . Frequency of Social Gatherings with Friends and Family: More than three times a week  . Attends Religious Services: Never  . Active Member of Clubs or Organizations: No  . Attends Archivist Meetings: Never  . Marital Status: Married    Tobacco Counseling Counseling given: Not Answered   Clinical Intake:  Pre-visit preparation completed: Yes  Pain : No/denies pain     Nutritional Status: BMI > 30  Obese Nutritional Risks: Nausea/ vomitting/ diarrhea (occasional nausea) Diabetes: Yes CBG done?: No Did pt. bring in CBG monitor from home?: No  How often do you need to have someone help you when you read instructions, pamphlets, or other written materials from your doctor or pharmacy?: 1 - Never  Nutrition Risk Assessment:  Has the patient had any N/V/D within the last 2 months?  Yes  Does the  patient have any non-healing wounds?  No  Has the patient had any unintentional  weight loss or weight gain?  No   Diabetes:  Is the patient diabetic?  Yes  If diabetic, was a CBG obtained today?  No  Did the patient bring in their glucometer from home?  No  How often do you monitor your CBG's? Occasionally per patient.   Financial Strains and Diabetes Management:  Are you having any financial strains with the device, your supplies or your medication? No .  Does the patient want to be seen by Chronic Care Management for management of their diabetes?  No  Would the patient like to be referred to a Nutritionist or for Diabetic Management?  No   Diabetic Exams:  Diabetic Eye Exam: Completed 10/23/17 negative retinopathy. Overdue for diabetic eye exam. Pt has been advised about the importance in completing this exam.   Diabetic Foot Exam: Completed 11/04/20.   Interpreter Needed?: No  Information entered by :: Clemetine Marker LPN   Activities of Daily Living In your present state of health, do you have any difficulty performing the following activities: 02/01/2021 12/27/2020  Hearing? N N  Comment declines hearing aids -  Vision? N N  Difficulty concentrating or making decisions? Y N  Walking or climbing stairs? N N  Dressing or bathing? N N  Doing errands, shopping? N N  Preparing Food and eating ? N -  Using the Toilet? N -  In the past six months, have you accidently leaked urine? N -  Do you have problems with loss of bowel control? N -  Managing your Medications? N -  Managing your Finances? N -  Housekeeping or managing your Housekeeping? N -  Some recent data might be hidden    Patient Care Team: Steele Sizer, MD as PCP - General (Family Medicine) [name], MD as Consulting Physician (Gastroenterology) Dagoberto Ligas, MD as Referring Physician (Nephrology) Minna Merritts, MD as Consulting Physician (Cardiology) Lenard Simmer, MD as Attending Physician  (Endocrinology) Sharol Given, NP as Nurse Practitioner (Psychiatry) Neldon Labella, RN as Case Manager  Indicate any recent Medical Services you may have received from other than [facility] providers in the past year (date may be approximate).     Assessment:   This is a routine wellness examination for [place].  Hearing/Vision screen  Hearing Screening   125Hz  250Hz  500Hz  1000Hz  2000Hz  3000Hz  4000Hz  6000Hz  8000Hz   Right ear:           Left ear:           Comments: Pt denies hearing difficulty   Vision Screening Comments: [facility]; due for exam  Dietary issues and exercise activities discussed: Current Exercise Habits: The patient does not participate in regular exercise at present, Exercise limited by: neurologic condition(s)  Goals Addressed            This Visit's Progress   . Increase physical activity       Recommend increasing physical activity to at least 3 days per week       Depression Screen PHQ 2/9 Scores 02/01/2021 01/27/2021 12/27/2020 11/04/2020 08/17/2020 08/03/2020 07/13/2020  PHQ - 2 Score 2 0 3 4 0 0 0  PHQ- 9 Score 9 - 6 14 2  0 0    Fall Risk Fall Risk  02/01/2021 01/27/2021 12/27/2020 11/04/2020 08/17/2020  Falls in the past year? 0 0 0 0 0  Comment - - - - -  Number falls in past yr: 0 0 0 0 0  Comment - - - - -  Injury with Fall? 0 - 0 0 0  Comment - - - - -  Risk for fall due to : No Fall Risks Medication side effect - - -  Follow up Falls prevention discussed Falls prevention discussed - - -    FALL RISK PREVENTION PERTAINING TO THE HOME:  Any stairs in or around the home? No  If so, are there any without handrails? No  Home free of loose throw rugs in walkways, pet beds, electrical cords, etc? Yes  Adequate lighting in your home to reduce risk of falls? Yes   ASSISTIVE DEVICES UTILIZED TO PREVENT FALLS:  Life alert? No  Use of a cane, walker or w/c? No  Grab bars in the bathroom? No  Shower chair or  bench in shower? Yes  Elevated toilet seat or a handicapped toilet? Yes   TIMED UP AND GO:  Was the test performed? No . Telephonic visit.   Cognitive Function: Normal cognitive status assessed by direct observation by this Nurse Health Advisor. No abnormalities found.       6CIT Screen 10/30/2017  What Year? 0 points  What month? 0 points  What time? 0 points  Count back from 20 2 points  Months in reverse 0 points  Repeat phrase 2 points  Total Score 4    Immunizations Immunization History  Administered Date(s) Administered  . Influenza Split 10/06/2013  . Influenza Whole 07/17/2007, 06/08/2008, 09/23/2011  . Influenza, Seasonal, Injecte, Preservative Fre 09/26/2012  . Influenza,inj,Quad PF,6+ Mos 05/13/2015, 09/28/2016, 07/18/2018, 06/15/2020  . Influenza-Unspecified 05/26/2014, 05/13/2015, 09/28/2016, 07/18/2018  . Pneumococcal Conjugate-13 09/23/2011, 11/16/2011  . Pneumococcal Polysaccharide-23 11/15/2007, 05/09/2013  . Tdap 05/13/2012, 04/01/2014, 11/09/2020    TDAP status: Up to date  Flu Vaccine status: Up to date  Pneumococcal vaccine status: Up to date  Covid-19 vaccine status: Declined, Education has been provided regarding the importance of this vaccine but patient still declined. Advised may receive this vaccine at local pharmacy or Health Dept.or vaccine clinic. Aware to provide a copy of the vaccination record if obtained from local pharmacy or Health Dept. Verbalized acceptance and understanding.  Qualifies for Shingles Vaccine? Yes   Zostavax completed No   Shingrix Completed?: No.    Education has been provided regarding the importance of this vaccine. Patient has been advised to call insurance company to determine out of pocket expense if they have not yet received this vaccine. Advised may also receive vaccine at local pharmacy or Health Dept. Verbalized acceptance and understanding.  Screening Tests Health Maintenance  Topic Date Due  . Zoster  Vaccines- Shingrix (1 of 2) Never done  . OPHTHALMOLOGY EXAM  10/23/2018  . MAMMOGRAM  11/06/2018  . COVID-19 Vaccine (1) 02/17/2021 (Originally 10/10/1977)  . INFLUENZA VACCINE  04/04/2021  . HEMOGLOBIN A1C  04/25/2021  . FOOT EXAM  11/04/2021  . COLONOSCOPY (Pts 45-75yr Insurance coverage will need to be confirmed)  03/03/2024  . TETANUS/TDAP  11/10/2030  . PNEUMOCOCCAL POLYSACCHARIDE VACCINE AGE 6-64 HIGH RISK  Completed  . Hepatitis C Screening  Completed  . HIV Screening  Completed  . HPV VACCINES  Aged Out    Health Maintenance  Health Maintenance Due  Topic Date Due  . Zoster Vaccines- Shingrix (1 of 2) Never done  . OPHTHALMOLOGY EXAM  10/23/2018  . MAMMOGRAM  11/06/2018    Colorectal cancer screening: Type of screening: Colonoscopy. Completed 03/04/19. Repeat every 5 years  Mammogram status: Completed 11/05/17. Repeat every year  Bone density screening: due age  81  Lung Cancer Screening: (Low Dose CT Chest recommended if Age 41-80 years, 30 pack-year currently smoking OR have quit w/in 15years.) does not qualify.   Additional Screening:  Hepatitis C Screening: does qualify; Completed 01/05/09  Vision Screening: Recommended annual ophthalmology exams for early detection of glaucoma and other disorders of the eye. Is the patient up to date with their annual eye exam?  No  Who is the provider or what is the name of the office in which the patient attends annual eye exams? [facility].   Dental Screening: Recommended annual dental exams for proper oral hygiene  Community Resource Referral / Chronic Care Management: CRR required this visit?  No   CCM required this visit?  No      Plan:     I have personally reviewed and noted the following in the patient's chart:   . Medical and social history . Use of alcohol, tobacco or illicit drugs  . Current medications and supplements including opioid prescriptions.  . Functional ability and status . Nutritional  status . Physical activity . Advanced directives . List of other physicians . Hospitalizations, surgeries, and ER visits in previous 12 months . Vitals . Screenings to include cognitive, depression, and falls . Referrals and appointments  In addition, I have reviewed and discussed with patient certain preventive protocols, quality metrics, and best practice recommendations. A written personalized care plan for preventive services as well as general preventive health recommendations were provided to patient.     Clemetine Marker, LPN   10/16/9415   Nurse Notes: none

## 2021-02-02 NOTE — Patient Instructions (Signed)
Thank you for allowing the Chronic Care Management team to participate in your care. It was a pleasures speaking with you today. Please feel free to contact me with questions.    Goals Addressed: Patient Care Plan: Heart Failure (Adult)  Problem Identified: Disease Progression (Heart Failure)     Long-Range Goal: Symptom Exacerbation Prevented or Minimized   Start Date: 01/27/2021  Expected End Date: 05/27/2021  Priority: High  Note:   Wt Readings from Last 3 Encounters:  01/25/21 195 lb 1.7 oz (88.5 kg)  01/06/21 195 lb (88.5 kg)  12/27/20 195 lb (88.5 kg)     Current Barriers:  . Chronic Disease Management support and educational needs r/t CHF.  Case Manager Clinical Goal(s):  Marland Kitchen Over the next 120 days, patient will not require hospitalization or emergent care d/t complications r/t CHF exacerbation.   Interventions:  . Collaboration with Stacey Sizer, MD regarding development and update of comprehensive plan of care as evidenced by provider attestation and co-signature . Inter-disciplinary care team collaboration (see longitudinal plan of care) . Provided information regarding recommended weight parameters. Encouraged to weigh daily and record readings. Encouraged to notify provider for weight gain greater than 3 lbs overnight or weight gain greater than 5 lbs within a week. Reports weights have remained stable. . Discussed nutritional intake.  Advised to closely monitor sodium consumption and avoid highly processed foods when possible.  . Discussed s/sx of fluid overload and indications for notifying a provider. Denies increased abdominal or lower extremity edema Notes previous episodes of chest discomfort but reports it was d/t acid reflux. Experiences exertional dyspnea. Denies shortness of breath at rest. Attempting to walk at least 2000 steps a day. Denies recent changes or decline in activity tolerance. . Reviewed worsening s/sx related to CHF exacerbation and indications for  seeking immediate medical attention.   Patient Goals/Self-Care Activities:  -Take all medications as prescribed -Monitor weight and record readings -Adhere to recommended cardiac prudent/heart healthy diet -Notify provider for weight gain outside of established parameters -Contact provider or care management team with questions and new concerns as needed.   Follow Up Plan:  Will follow up next month      Patient Care Plan: COPD (Adult)    Problem Identified: Symptom Exacerbation (COPD)     Long-Range Goal: Symptom Exacerbation Prevented or Minimized   Start Date: 01/27/2021  Expected End Date: 05/27/2021  Priority: High  Note:    Current Barriers:  . Chronic Disease Management support and educational needs r/t COPD  Case Manager Clinical Goal(s): Marland Kitchen Over the next 120 days, patient will not require hospitalization for complications r/t COPD exacerbation   Interventions:  . Collaboration with Stacey Sizer, MD regarding development and update of comprehensive plan of care as evidenced by provider attestation and co-signature . Inter-disciplinary care team collaboration (see longitudinal plan of care) . Reviewed medications. Reports compliance with prescribed inhalers. Encouraged to continue taking medications and utilizing inhalers as prescribed. Encouraged to notify the care management team with concerns regarding prescription cost. . Provided education regarding self-management and exacerbation prevention. Discussed COPD triggers. Encouraged to take needed precautions and avoid extreme temperatures to prevent exacerbation. . Discussed current action plan and reinforced importance of daily self assessment. Advised to make an appointment if symptoms persist for 48 hours without improvement. Reviewed worsening symptoms that require immediate medical attention.  . Provided information regarding infection prevention and increased risk r/t COPD.  Advised to utilize prevention  strategies to reduce risk of respiratory infection.  Self-Care Activities/Patient Goals: -Take medications and utilize inhalers as prescribed -Assess symptoms daily and notify provider if symptoms persist.  -Avoid extreme temperatures and strenuous activities. -Follow recommendations to prevent respiratory infection -Notify provider or care management team with questions and new concerns as needed    Follow Up Plan:  Will follow up next month      Patient Care Plan: Diabetes Type 2 (Adult)    Problem Identified: Disease Progression (Diabetes, Type 2)     Long-Range Goal: Disease Progression Prevented or Minimized   Start Date: 01/27/2021  Expected End Date: 05/27/2021  Priority: High  Note:   Objective:  Lab Results  Component Value Date   HGBA1C 8.7 10/26/2020 .   Lab Results  Component Value Date   CREATININE 1.50 (H) 01/25/2021   CREATININE 0.96 12/03/2020   CREATININE 0.93 12/02/2020 .   Marland Kitchen No results found for: EGFR   Current Barriers:  . Chronic disease management support and educational needs related to Diabetes self-management   Case Manager Clinical Goal(s):  Marland Kitchen Over the next 120 days, patient will demonstrate improved adherence to prescribed treatment plan for Diabetes management as evidenced by taking medications as prescribed, monitoring and recording CBGs, and adherence to an ADA/ carb modified diet.   Interventions:  . Collaboration with Stacey Sizer, MD regarding development and update of comprehensive plan of care as evidenced by provider attestation and co-signature . Inter-disciplinary care team collaboration (see longitudinal plan of care) . Reviewed medications. Encouraged to take medications as prescribed and notify team with concerns regarding prescription cost. . Provided information regarding importance of consistent blood glucose monitoring. Encouraged to monitor and maintain a log. Reports fasting reading today was 168 mg/dl. . Reviewed  s/sx of hypoglycemia and hyperglycemia along with recommended interventions. . Discussed nutritional intake and compliance with a diabetic/carb modified diet. Discussed and offered referrals for available Diabetes education classes. Notes an increased consumption of sweets and snacks. Discussed diabetic/carb modified food options. Agreeable to considering nutritional and diabetes education classes if her A1C does not improve.  . Discussed importance of completing recommended DM preventive care. Reports completing recommended foot care. Unable to recall exact date of last eye exam but recalls completing the exam with [name].   Patient Goals/Self-Care Activities -Self-administer medications as prescribed -Attend all scheduled provider appointments -Monitor blood glucose levels consistently and utilize recommended interventions -Adhere to prescribed ADA/carb modified -Notify provider or care management team with questions and new concerns as needed   Follow Up Plan:  Will follow up next month       [name] was given information about Chronic Care Management services including:  1. CCM service includes personalized support from designated clinical staff supervised by her physician, including individualized plan of care and coordination with other care providers 2. 24/7 contact phone numbers for assistance for urgent and routine care needs. 3. Service will only be billed when office clinical staff spend 20 minutes or more in a month to coordinate care. 4. Only one practitioner may furnish and bill the service in a calendar month. 5. The patient may stop CCM services at any time (effective at the end of the month) by phone call to the office staff. 6. The patient will be responsible for cost sharing (co-pay) of up to 20% of the service fee (after annual deductible is met).  Patient agreed to services and verbal consent obtained.       [name] verbalized understanding  of the information discussed during the telephonic  outreach today. A member of the care management team will follow up next month.    [facility] Care Management [facility] [phone]

## 2021-02-03 DIAGNOSIS — E1165 Type 2 diabetes mellitus with hyperglycemia: Secondary | ICD-10-CM | POA: Diagnosis not present

## 2021-02-03 DIAGNOSIS — E7800 Pure hypercholesterolemia, unspecified: Secondary | ICD-10-CM | POA: Diagnosis not present

## 2021-02-03 DIAGNOSIS — I10 Essential (primary) hypertension: Secondary | ICD-10-CM | POA: Diagnosis not present

## 2021-02-03 DIAGNOSIS — D631 Anemia in chronic kidney disease: Secondary | ICD-10-CM | POA: Diagnosis not present

## 2021-02-03 LAB — HEMOGLOBIN A1C: Hemoglobin A1C: 7.7

## 2021-02-14 ENCOUNTER — Encounter: Payer: Self-pay | Admitting: Family Medicine

## 2021-02-14 ENCOUNTER — Telehealth (INDEPENDENT_AMBULATORY_CARE_PROVIDER_SITE_OTHER): Payer: Medicare Other | Admitting: Family Medicine

## 2021-02-14 ENCOUNTER — Ambulatory Visit: Payer: Self-pay

## 2021-02-14 ENCOUNTER — Ambulatory Visit: Payer: Medicare Other | Admitting: Family Medicine

## 2021-02-14 DIAGNOSIS — N183 Chronic kidney disease, stage 3 unspecified: Secondary | ICD-10-CM | POA: Diagnosis not present

## 2021-02-14 DIAGNOSIS — E1169 Type 2 diabetes mellitus with other specified complication: Secondary | ICD-10-CM

## 2021-02-14 DIAGNOSIS — J329 Chronic sinusitis, unspecified: Secondary | ICD-10-CM | POA: Diagnosis not present

## 2021-02-14 DIAGNOSIS — J310 Chronic rhinitis: Secondary | ICD-10-CM

## 2021-02-14 DIAGNOSIS — E669 Obesity, unspecified: Secondary | ICD-10-CM | POA: Diagnosis not present

## 2021-02-14 DIAGNOSIS — Z20822 Contact with and (suspected) exposure to covid-19: Secondary | ICD-10-CM

## 2021-02-14 DIAGNOSIS — R059 Cough, unspecified: Secondary | ICD-10-CM

## 2021-02-14 MED ORDER — DOXYCYCLINE HYCLATE 100 MG PO TABS: 100.0000 mg | ORAL_TABLET | Freq: Two times a day (BID) | ORAL | 0 refills | Status: AC

## 2021-02-14 NOTE — Telephone Encounter (Signed)
Pt. Reports she started having cough, runny nose and fever yesterday. Temp. Now 100.6. Non-productive cough. "Cough kept me up all night." Home COVID test negative.Virtual visit for today.

## 2021-02-14 NOTE — Telephone Encounter (Signed)
Reason for Disposition  [1] Continuous (nonstop) coughing interferes with work or school AND [2] no improvement using cough treatment per Care Advice  Answer Assessment - Initial Assessment Questions 1. ONSET: "When did the cough begin?"     Yesterday 2. SEVERITY: "How bad is the cough today?"      Severe 3. SPUTUM: "Describe the color of your sputum" (none, dry cough; clear, white, yellow, green)     No 4. HEMOPTYSIS: "Are you coughing up any blood?" If so ask: "How much?" (flecks, streaks, tablespoons, etc.)     No 5. DIFFICULTY BREATHING: "Are you having difficulty breathing?" If Yes, ask: "How bad is it?" (e.g., mild, moderate, severe)    - MILD: No SOB at rest, mild SOB with walking, speaks normally in sentences, can lie down, no retractions, pulse < 100.    - MODERATE: SOB at rest, SOB with minimal exertion and prefers to sit, cannot lie down flat, speaks in phrases, mild retractions, audible wheezing, pulse 100-120.    - SEVERE: Very SOB at rest, speaks in single words, struggling to breathe, sitting hunched forward, retractions, pulse > 120      No 6. FEVER: "Do you have a fever?" If Yes, ask: "What is your temperature, how was it measured, and when did it start?"     100.6 7. CARDIAC HISTORY: "Do you have any history of heart disease?" (e.g., heart attack, congestive heart failure)      Yes 8. LUNG HISTORY: "Do you have any history of lung disease?"  (e.g., pulmonary embolus, asthma, emphysema)     O2 at night 9. PE RISK FACTORS: "Do you have a history of blood clots?" (or: recent major surgery, recent prolonged travel, bedridden)     No 10. OTHER SYMPTOMS: "Do you have any other symptoms?" (e.g., runny nose, wheezing, chest pain)       Runny nose 11. PREGNANCY: "Is there any chance you are pregnant?" "When was your last menstrual period?"       No 12. TRAVEL: "Have you traveled out of the country in the last month?" (e.g., travel history, exposures)       No  Protocols used:  Cough - Acute Non-Productive-A-AH

## 2021-02-14 NOTE — Progress Notes (Signed)
Name: [name]   MRN: [id number]    DOB: [birth year]   Date:02/14/2021       Progress Note  Subjective:    Chief Complaint  Chief Complaint  Patient presents with   Cough    Headache, fever, sore throat onset 1 day.  Grandson pos, she took at home test today neg    I connected with  [name] on 02/14/21 at  1:20 PM EDT by telephone and verified that I am speaking with the correct person using two identifiers.   I discussed the limitations, risks, security and privacy concerns of performing an evaluation and management service by telephone and the availability of in person appointments. Staff also discussed with the patient that there may be a patient responsible charge related to this service.  Patient verbalized understanding and agreed to proceed with encounter. Patient Location: home Provider Location: [facility] Additional Individuals present: none  HPI Pt presents via telephone with 1-2 d of URI type sx, gradual onset She endorses HA over eyes, sores in nose with runny nose that has become raw, her teeth hurt to bite, associated sore throat  Dry cough, sometimes causes her to gag but no vomiting or productive sputum  Tmax 100.6 Possible exposure to COVID? Had [place] last Aug - hospitalized with respiratory failure, Hx of multiple hospital admissions with infections, sepsis, most recently March  Her blood sugars have been high 300+ and she does have endocrinology f/up this week. IDDM  She denies CP, SOB, near syncope, severe fatigue, confusion, DOE She took home test today and it was negative She is not vaccinated against COVID     Patient Active Problem List   Diagnosis Date Noted   Severe sepsis with septic shock ([place]) 11/30/2020   Syncope 11/29/2020   Chronic congestive heart failure ([place]) 11/04/2020   Iron deficiency anemia due to chronic blood loss    Polyp of ascending colon    Chronic gastric ulcer with hemorrhage    Vitamin B12 deficiency 08/08/2018    Patient is full code 10/30/2017   Coronary artery disease 10/09/2017   Plantar fascial fibromatosis 10/09/2017   Tendinitis of wrist 10/09/2017   Schizoaffective disorder, bipolar type ([place]) 09/28/2017   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 09/28/2016   Breast cancer screening 09/28/2016   Acute lower UTI 03/14/2016   Adenomatous colon polyp 10/15/2015   Bipolar affective disorder ([place]) 10/15/2015   CN (constipation) 10/15/2015   Drug-induced hepatic toxicity 10/15/2015   HLD (hyperlipidemia) 10/15/2015   Hypoxemia 10/15/2015   NASH (nonalcoholic steatohepatitis) 10/15/2015   Morbid obesity ([place]) 10/15/2015   Restless leg syndrome 10/15/2015   Type 2 diabetes, uncontrolled, with neuropathy ([place]) 10/15/2015   Elevated alkaline phosphatase level 09/22/2015   Low back pain with left-sided sciatica 08/18/2015   Apnea, sleep 05/05/2015   Heart valve disease 05/05/2015   Chronic kidney disease, stage III (moderate) ([place]) 03/15/2015   Migraine 03/12/2015   COPD (chronic obstructive pulmonary disease) ([place]) 01/20/2015   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 , Disp: , Rfl:    busPIRone (BUSPAR) 30 MG tablet, Take 30 mg by mouth 2 (two) times daily., Disp: , Rfl:    citalopram (CELEXA) 20 MG tablet, Take 20 mg by mouth daily., Disp: , Rfl:    dexlansoprazole (DEXILANT) 60 MG capsule, Take 1 capsule (60 mg total) by mouth daily., Disp: 30 capsule,  Rfl: 6   ezetimibe (ZETIA) 10 MG tablet, Take 10 mg by mouth at bedtime., Disp: , Rfl:    fenofibrate micronized (LOFIBRA) 134 MG capsule, Take 134 mg by mouth at bedtime., Disp: , Rfl:    gabapentin (NEURONTIN) 300 MG capsule, Take 300 mg by mouth 2 (two) times daily., Disp: , Rfl:    gabapentin (NEURONTIN) 600 MG tablet, Take 600 mg by mouth at bedtime., Disp: , Rfl:    GLYXAMBI 25-5 MG TABS, Take 1 tablet by mouth daily., Disp: , Rfl:    HUMULIN R U-500 KWIKPEN 500 UNIT/ML kwikpen, Inject 70 Units into the skin 3 (three) times daily with meals., Disp: , Rfl:    insulin aspart (NOVOLOG FLEXPEN) 100 UNIT/ML FlexPen, Inject 3 Units subcutaneously. Sliding scale., Disp: , Rfl:    lamoTRIgine (LAMICTAL) 200 MG tablet, Take 200 mg by mouth 2 (two) times daily. , Disp: , Rfl:    lisinopril (ZESTRIL) 2.5 MG tablet, Take 1 tablet (2.5 mg total) by mouth daily., Disp: 90 tablet, Rfl: 2   methocarbamol (ROBAXIN) 750 MG tablet, methocarbamol 750 mg tablet  Take 1 tablet every 8 hours by oral route., Disp: , Rfl:    metoprolol tartrate (LOPRESSOR) 25 MG tablet, Take 1 tablet (25 mg total) by mouth 2 (two) times daily., Disp: 180 tablet, Rfl: 1   nitroGLYCERIN (NITROSTAT) 0.4 MG SL tablet, Place 1 tablet (0.4 mg total) under the tongue every 5 (five) minutes as needed for chest pain., Disp: 25 tablet, Rfl: 0   OneTouch Delica Lancets 40J MISC, by Does not apply route., Disp: , Rfl:    promethazine (PHENERGAN) 12.5 MG tablet, TAKE 1 TABLET BY MOUTH EVERY 6 HOURS AS NEEDED FOR NAUSEA OR VOMITING, Disp: 30 tablet, Rfl: 1   QUEtiapine (SEROQUEL) 200 MG tablet, Take 200 mg by mouth at bedtime. Along with 334m dose for a total of 5053m Disp: , Rfl:    QUEtiapine (SEROQUEL) 300 MG tablet, Take 300 mg by mouth at bedtime. Along with 20062mose for a total of 500m48misp: , Rfl:    SYNTHROID 100 MCG tablet, Take 100 mcg by mouth daily., Disp: , Rfl:    TRULICITY 1.5 MG/0WJ/1.9JYN, Inject 1.5 mg into the skin once a  week. (Wednesdays), Disp: , Rfl:    SUMAtriptan (IMITREX) 100 MG tablet, Take 1 tablet (100 mg total) by mouth once for 1 dose. May repeat in 2 hours if headache persists or recurs., Disp: 10 tablet, Rfl: 0  Allergies  Allergen Reactions   Mirtazapine Anaphylaxis    REACTION: closes throat   Morphine And Related     anaphylaxis   Sulfamethoxazole-Trimethoprim Rash    REACTION: Whelps all over   Vicodin Hp [Hydrocodone-Acetaminophen]    Amoxicillin Er Nausea And Vomiting    Pt doesn't remember problem with amoxicillin -  SE Has patient had a PCN reaction causing immediate rash, facial/tongue/throat swelling, SOB or lightheadedness with hypotension: No Has patient had a PCN reaction causing severe  rash involving mucus membranes or skin necrosis: No Has patient had a PCN reaction that required hospitalization: No Has patient had a PCN reaction occurring within the last 10 years: Yes If all of the above answers are "NO", then may proceed with Cephalosporin use.   Codeine Itching    REACTION: itch, rash   Hydrocodone-Acetaminophen Rash    REACTION: rash   Keflex [Cephalexin] Rash    Pt does not remember having a reaction or rash, no airway involvement   Prasterone Nausea And Vomiting    Chart Review: I personally reviewed active problem list, medication list, allergies, family history, social history, health maintenance, notes from last encounter, lab results, imaging with the patient/caregiver today.   Review of Systems  Constitutional: Negative.   HENT: Negative.    Eyes: Negative.   Respiratory: Negative.    Cardiovascular: Negative.   Gastrointestinal: Negative.   Endocrine: Negative.   Genitourinary: Negative.   Musculoskeletal: Negative.   Skin: Negative.   Allergic/Immunologic: Negative.   Neurological: Negative.   Hematological: Negative.   Psychiatric/Behavioral: Negative.    All other systems reviewed and are negative.   Objective:    Virtual encounter,  vitals limited, only able to obtain the following There were no vitals filed for this visit. There is no height or weight on file to calculate BMI. Nursing Note and Vital Signs reviewed.  Physical Exam Vitals and nursing note reviewed.  Pulmonary:     Effort: No respiratory distress.     Breath sounds: No wheezing.     Comments: No audible wheeze or stridor, intermittent coughing, pt able to speak in full and complete sentences w/o obvious distress Neurological:     Mental Status: She is alert.    PE limited by telephone encounter  No results found for this or any previous visit (from the past 72 hour(s)).  Assessment and Plan:     ICD-10-CM   1. Cough  R05.9 doxycycline (VIBRA-TABS) 100 MG tablet    Novel Coronavirus, NAA ([facility])    2. Exposure to COVID-19 virus  Z20.822 Novel Coronavirus, NAA ([facility])    3. Rhinosinusitis  J31.0 doxycycline (VIBRA-TABS) 100 MG tablet   J32.9 Novel Coronavirus, NAA ([facility])    4. Diabetes mellitus type 2 in obese (HCC)  K91.79 COMPLETE METABOLIC PANEL WITH GFR   E66.9     5. Stage 3 chronic kidney disease, unspecified whether stage 3a or 3b CKD (HCC)  X50.56 COMPLETE METABOLIC PANEL WITH GFR    For now supportive care - cough meds, mucinex, OTC med Come by office for COVID PCR test If any severe worsening of facial/sinus pain/pressure can start doxy for coverage for sinusitis - would cover atypical lung infections - lower threshold to cover pt with abx given her recently hx of illnesses/sepsis and hospitalization. Labs to assess renal funciton if COVID is + Sx onset 1-2 days ago so she is in window for tx if positive No Vital signs obtained by staff today and she could only do telephone encounter, so my assessment is very limited, but she did not sound distressed.   -Red flags and when to present for emergency care or RTC including but not limited to new/worsening/un-resolving symptoms,  reviewed with patient at time of visit. Follow up  and care instructions discussed and provided in AVS. - I discussed the assessment and treatment plan with the patient. The patient was provided an opportunity to ask questions and all were answered. The patient agreed with the plan and demonstrated  an understanding of the instructions.  - The patient was advised to call back or seek an in-person evaluation if the symptoms worsen or if the condition fails to improve as anticipated.  I provided 20+ minutes of non-face-to-face time during this encounter.  [name], PA-C 02/14/21 2:07 PM

## 2021-02-15 ENCOUNTER — Other Ambulatory Visit: Payer: Self-pay | Admitting: Family Medicine

## 2021-02-15 DIAGNOSIS — J069 Acute upper respiratory infection, unspecified: Secondary | ICD-10-CM

## 2021-02-15 LAB — COMPLETE METABOLIC PANEL WITH GFR
AG Ratio: 1.8 (calc) (ref 1.0–2.5)
ALT: 20 U/L (ref 6–29)
AST: 26 U/L (ref 10–35)
Albumin: 3.9 g/dL (ref 3.6–5.1)
Alkaline phosphatase (APISO): 43 U/L (ref 37–153)
BUN/Creatinine Ratio: 18 (calc) (ref 6–22)
BUN: 24 mg/dL (ref 7–25)
CO2: 23 mmol/L (ref 20–32)
Calcium: 9.5 mg/dL (ref 8.6–10.4)
Chloride: 106 mmol/L (ref 98–110)
Creat: 1.37 mg/dL — ABNORMAL HIGH (ref 0.50–1.05)
GFR, Est African American: 49 mL/min/{1.73_m2} — ABNORMAL LOW (ref >=60)
GFR, Est Non African American: 43 mL/min/{1.73_m2} — ABNORMAL LOW (ref >=60)
Globulin: 2.2 g/dL (calc) (ref 1.9–3.7)
Glucose, Bld: 261 mg/dL — ABNORMAL HIGH (ref 65–99)
Potassium: 4.4 mmol/L (ref 3.5–5.3)
Sodium: 140 mmol/L (ref 135–146)
Total Bilirubin: 0.2 mg/dL (ref 0.2–1.2)
Total Protein: 6.1 g/dL (ref 6.1–8.1)

## 2021-02-15 LAB — SPECIMEN STATUS REPORT

## 2021-02-15 LAB — NOVEL CORONAVIRUS, NAA: SARS-CoV-2, NAA: DETECTED — AB

## 2021-02-15 LAB — SARS-COV-2, NAA 2 DAY TAT

## 2021-02-15 MED ORDER — NIRMATRELVIR/RITONAVIR (PAXLOVID) TABLET (RENAL DOSING): 2.0000 | ORAL_TABLET | Freq: Two times a day (BID) | ORAL | 0 refills | Status: AC

## 2021-02-16 ENCOUNTER — Other Ambulatory Visit: Payer: Self-pay | Admitting: Family Medicine

## 2021-02-16 DIAGNOSIS — J069 Acute upper respiratory infection, unspecified: Secondary | ICD-10-CM

## 2021-02-16 DIAGNOSIS — U071 COVID-19: Secondary | ICD-10-CM

## 2021-02-16 NOTE — Telephone Encounter (Signed)
   Notes to clinic: Anderson Malta with [facility] stated they do not have the medication for Covid and the Rx will have to be sent to a chain pharmacy. Anderson Malta stated pt advised her that she prefers the Rx to be sent to [facility] in [place]   Requested Prescriptions  Pending Prescriptions Disp Refills   nirmatrelvir/ritonavir EUA, renal dosing, (PAXLOVID) TABS 20 tablet 0    Sig: Take 2 tablets by mouth 2 (two) times daily for 5 days. (Take nirmatrelvir 150 mg one tablet twice daily for 5 days and ritonavir 100 mg one tablet twice daily for 5 days) Patient GFR is 43      There is no refill protocol information for this order

## 2021-02-16 NOTE — Telephone Encounter (Signed)
Copied from [place] [id number]. Topic: Quick Communication - Rx Refill/Question >> Feb 16, 2021  9:27 AM Yvette Rack wrote: Anderson Malta with [facility] stated they do not have the medication for Covid and the Rx will have to be sent to a chain pharmacy. Anderson Malta stated pt advised her that she prefers the Rx to be sent to [facility] in [place]   Medication: nirmatrelvir/ritonavir EUA, renal dosing, (PAXLOVID) TABS  Has the patient contacted their pharmacy? Yes.   (Agent: If no, request that the patient contact the pharmacy for the refill.) (Agent: If yes, when and what did the pharmacy advise?)  Preferred Pharmacy (with phone number or street name): [facility] [place], [place] [address] Phone: [phone]   Fax: [phone]  Agent: Please be advised that RX refills may take up to 3 business days. We ask that you follow-up with your pharmacy.

## 2021-02-16 NOTE — Telephone Encounter (Signed)
Last seen 6.13.2022 no future appts sch'd

## 2021-02-24 ENCOUNTER — Telehealth: Payer: Self-pay

## 2021-02-24 ENCOUNTER — Other Ambulatory Visit: Payer: Self-pay

## 2021-02-24 NOTE — Telephone Encounter (Signed)
  Care Management   Follow Up Note   02/24/2021 Name: DORLEEN KISSEL MRN: [id number] DOB: 06/16/1966   Primary Care Provider: Steele Sizer, MD Reason for referral : Chronic Care Management   An unsuccessful outreach was attempted today. Ms. Castille is currently enrolled in the Chronic Care Management program.    Follow Up Plan:  A HIPAA compliant voice message was left today requesting a return call.    Cristy [facility] Care Management [facility] [phone]

## 2021-02-25 NOTE — Telephone Encounter (Signed)
Medication Refill - Medication:  SUMAtriptan (IMITREX) 100 MG tablet  Has the patient contacted their pharmacy? Yes.   (Agent: If no, request that the patient contact the pharmacy for the refill.) (Agent: If yes, when and what did the pharmacy advise?)  Preferred Pharmacy (with phone number or street name):  [facility] [address] [place], [place] - [place]  [place] [address]  Phone: [phone] Fax: [phone]    Agent: Please be advised that RX refills may take up to 3 business days. We ask that you follow-up with your pharmacy.

## 2021-02-26 MED ORDER — LISINOPRIL 2.5 MG PO TABS: 2.5000 mg | ORAL_TABLET | Freq: Every day | ORAL | 0 refills | Status: DC

## 2021-03-02 ENCOUNTER — Encounter: Payer: Self-pay | Admitting: Family Medicine

## 2021-03-03 ENCOUNTER — Ambulatory Visit: Payer: Self-pay

## 2021-03-03 DIAGNOSIS — IMO0002 Reserved for concepts with insufficient information to code with codable children: Secondary | ICD-10-CM

## 2021-03-03 DIAGNOSIS — E1140 Type 2 diabetes mellitus with diabetic neuropathy, unspecified: Secondary | ICD-10-CM

## 2021-03-03 NOTE — Chronic Care Management (AMB) (Signed)
  Chronic Care Management   Note  03/03/2021 Name: CASARA PERRIER MRN: [id number] DOB: 08/03/1966    Mrs. Kruck is currently enrolled in the Chronic Care Management program. Called today to reschedule care management outreach. Confirmed availability for 03/16/21. Agreed to call if needed prior to the scheduled outreach.    PLAN Will follow up on 03/16/21.    Cristy [facility] Care Management [facility] [place] [phone]

## 2021-03-10 ENCOUNTER — Other Ambulatory Visit: Payer: Self-pay

## 2021-03-10 ENCOUNTER — Ambulatory Visit (INDEPENDENT_AMBULATORY_CARE_PROVIDER_SITE_OTHER): Payer: Medicare Other | Admitting: Unknown Physician Specialty

## 2021-03-10 VITALS — BP 132/72 | HR 64 | Temp 98.4°F | Resp 16 | Ht 59.0 in | Wt 181.9 lb

## 2021-03-10 DIAGNOSIS — B359 Dermatophytosis, unspecified: Secondary | ICD-10-CM

## 2021-03-10 DIAGNOSIS — N3001 Acute cystitis with hematuria: Secondary | ICD-10-CM

## 2021-03-10 LAB — POCT URINALYSIS DIPSTICK
Bilirubin, UA: NEGATIVE
Glucose, UA: POSITIVE — AB
Ketones, UA: NEGATIVE
Nitrite, UA: NEGATIVE
Protein, UA: POSITIVE — AB
Spec Grav, UA: 1.02 (ref 1.010–1.025)
Urobilinogen, UA: 0.2 E.U./dL
pH, UA: 5 (ref 5.0–8.0)

## 2021-03-10 MED ORDER — NITROFURANTOIN MONOHYD MACRO 100 MG PO CAPS: 100.0000 mg | ORAL_CAPSULE | Freq: Two times a day (BID) | ORAL | 0 refills | Status: DC

## 2021-03-10 NOTE — Progress Notes (Signed)
BP 132/72   Pulse 64   Temp 98.4 F (36.9 C) (Oral)   Resp 16   Ht 4' 11"  (1.499 m)   Wt 181 lb 14.4 oz (82.5 kg)   SpO2 93%   BMI 36.74 kg/m    Subjective:    Patient ID: [name], female    DOB: 08-12-1966, 55 y.o.   MRN: [id number]  HPI: [name] is a 55 y.o. female  Chief Complaint  Patient presents with   Urinary Tract Infection   Urinary Tract Infection  This is a new problem. Episode onset: 4-5 days. The problem occurs every urination. The problem has been unchanged. The quality of the pain is described as aching. The pain is moderate. There has been no fever. There is A history of pyelonephritis. Associated symptoms include frequency, hesitancy, nausea and urgency. Pertinent negatives include no chills, discharge, flank pain, hematuria, sweats or vomiting. She has tried nothing for the symptoms.   Rash Rash on abdomen started yesterday with severe itch.  Tried Neosporin and Cortisone cream without any help.    Relevant past medical, surgical, family and social history reviewed and updated as indicated. Interim medical history since our last visit reviewed. Allergies and medications reviewed and updated.  Review of Systems  Constitutional:  Negative for chills.  Gastrointestinal:  Positive for nausea. Negative for vomiting.  Genitourinary:  Positive for frequency, hesitancy and urgency. Negative for flank pain and hematuria.   Per HPI unless specifically indicated above     Objective:    BP 132/72   Pulse 64   Temp 98.4 F (36.9 C) (Oral)   Resp 16   Ht 4' 11"  (1.499 m)   Wt 181 lb 14.4 oz (82.5 kg)   SpO2 93%   BMI 36.74 kg/m   Wt Readings from Last 3 Encounters:  03/10/21 181 lb 14.4 oz (82.5 kg)  01/25/21 195 lb 1.7 oz (88.5 kg)  01/06/21 195 lb (88.5 kg)    Physical Exam Constitutional:      General: She is not in acute distress.    Appearance: Normal appearance. She is well-developed.  HENT:     Head: Normocephalic and atraumatic.   Eyes:     General: Lids are normal. No scleral icterus.       Right eye: No discharge.        Left eye: No discharge.     Conjunctiva/sclera: Conjunctivae normal.  Neck:     Vascular: No carotid bruit or JVD.  Cardiovascular:     Rate and Rhythm: Normal rate and regular rhythm.     Heart sounds: Normal heart sounds.  Pulmonary:     Effort: Pulmonary effort is normal. No respiratory distress.     Breath sounds: Normal breath sounds.  Abdominal:     Palpations: There is no hepatomegaly or splenomegaly.  Musculoskeletal:        General: Normal range of motion.     Cervical back: Normal range of motion and neck supple.  Skin:    General: Skin is warm and dry.     Coloration: Skin is not pale.     Findings: Rash present.     Comments: Reddened area below pannus  Neurological:     Mental Status: She is alert and oriented to person, place, and time.  Psychiatric:        Behavior: Behavior normal.        Thought Content: Thought content normal.  Judgment: Judgment normal.    Results for orders placed or performed in visit on 03/10/21  POCT urinalysis dipstick  Result Value Ref Range   Color, UA yellow    Clarity, UA cloudy    Glucose, UA Positive (A) Negative   Bilirubin, UA negative    Ketones, UA negative    Spec Grav, UA 1.020 1.010 - 1.025   Blood, UA small    pH, UA 5.0 5.0 - 8.0   Protein, UA Positive (A) Negative   Urobilinogen, UA 0.2 0.2 or 1.0 E.U./dL   Nitrite, UA negative    Leukocytes, UA Trace (A) Negative   Appearance clear    Odor none       Assessment & Plan:   Problem List Items Addressed This Visit   None Visit Diagnoses     Acute cystitis with hematuria    -  Primary   Rx for Macrobid.  Trace leukocytes - send urine for culture   Relevant Orders   POCT urinalysis dipstick (Completed)   Urine Culture   Tinea       Nystatin ointment to area twice a day.Keep area dry.  Encouraged blood sugar control   Relevant Medications    nitrofurantoin, macrocrystal-monohydrate, (MACROBID) 100 MG capsule       Will follow-up with endocrine for probably poorly controlled blood sugar  Follow up plan: Return if symptoms worsen or fail to improve.

## 2021-03-11 ENCOUNTER — Telehealth: Payer: Self-pay

## 2021-03-11 LAB — URINE CULTURE
MICRO NUMBER:: 12093067
SPECIMEN QUALITY:: ADEQUATE

## 2021-03-11 MED ORDER — FLUCONAZOLE 150 MG PO TABS: 150.0000 mg | ORAL_TABLET | ORAL | 0 refills | Status: DC | PRN

## 2021-03-11 NOTE — Telephone Encounter (Signed)
Copied from [place] [id number]. Topic: General - Other >> Mar 10, 2021  4:58 PM Celene Kras wrote: Reason for CRM: Pt called stating that the pt is needing to have a medication for yeast infection under her stomach. She states that she was supposed to get it after her appt today. Pt is currently at the drug store. Attempted to contact office with no answer x 3. Please advise.

## 2021-03-11 NOTE — Telephone Encounter (Signed)
Can this patient  get a script for Diflucan. Was seen 7/7 for UTI

## 2021-03-16 ENCOUNTER — Ambulatory Visit (INDEPENDENT_AMBULATORY_CARE_PROVIDER_SITE_OTHER): Payer: Medicare Other

## 2021-03-16 DIAGNOSIS — E7800 Pure hypercholesterolemia, unspecified: Secondary | ICD-10-CM | POA: Diagnosis not present

## 2021-03-16 DIAGNOSIS — IMO0002 Reserved for concepts with insufficient information to code with codable children: Secondary | ICD-10-CM

## 2021-03-16 DIAGNOSIS — E1165 Type 2 diabetes mellitus with hyperglycemia: Secondary | ICD-10-CM | POA: Diagnosis not present

## 2021-03-16 DIAGNOSIS — J449 Chronic obstructive pulmonary disease, unspecified: Secondary | ICD-10-CM | POA: Diagnosis not present

## 2021-03-16 DIAGNOSIS — E1140 Type 2 diabetes mellitus with diabetic neuropathy, unspecified: Secondary | ICD-10-CM

## 2021-03-16 DIAGNOSIS — D631 Anemia in chronic kidney disease: Secondary | ICD-10-CM | POA: Diagnosis not present

## 2021-03-16 DIAGNOSIS — E039 Hypothyroidism, unspecified: Secondary | ICD-10-CM | POA: Diagnosis not present

## 2021-03-16 NOTE — Chronic Care Management (AMB) (Signed)
. Chronic Care Management   CCM RN Visit Note   Name: [name] MRN: [id number] DOB: 11-02-1965  Subjective: [name] is a 55 y.o. year old female who is a primary care patient of Steele Sizer, MD. The care management team was consulted for assistance with disease management and care coordination needs.    Engaged with patient by telephone for follow up visit in response to provider referral for case management and/or care coordination services.   Consent to Services:  The patient was given information about Chronic Care Management services, agreed to services, and gave verbal consent prior to initiation of services.  Please see initial visit note for detailed documentation.    Assessment: Review of patient past medical history, allergies, medications, health status, including review of consultants reports, laboratory and other test data, was performed as part of comprehensive evaluation and provision of chronic care management services.   SDOH (Social Determinants of Health) assessments and interventions performed:    CCM Care Plan  Allergies  Allergen Reactions   Mirtazapine Anaphylaxis    REACTION: closes throat   Morphine And Related     anaphylaxis   Sulfamethoxazole-Trimethoprim Rash    REACTION: Whelps all over   Vicodin Hp [Hydrocodone-Acetaminophen]    Amoxicillin Er Nausea And Vomiting    Pt doesn't remember problem with amoxicillin -  SE Has patient had a PCN reaction causing immediate rash, facial/tongue/throat swelling, SOB or lightheadedness with hypotension: No Has patient had a PCN reaction causing severe rash involving mucus membranes or skin necrosis: No Has patient had a PCN reaction that required hospitalization: No Has patient had a PCN reaction occurring within the last 10 years: Yes If all of the above answers are "NO", then may proceed with Cephalosporin use.   Codeine Itching    REACTION: itch, rash   Hydrocodone-Acetaminophen Rash     REACTION: rash   Keflex [Cephalexin] Rash    Pt does not remember having a reaction or rash, no airway involvement   Prasterone Nausea And Vomiting    Outpatient Encounter Medications as of 03/16/2021  Medication Sig   atorvastatin (LIPITOR) 80 MG tablet Take 80 mg by mouth at bedtime.    busPIRone (BUSPAR) 30 MG tablet Take 30 mg by mouth 2 (two) times daily.   citalopram (CELEXA) 20 MG tablet Take 20 mg by mouth daily.   dexlansoprazole (DEXILANT) 60 MG capsule Take 1 capsule (60 mg total) by mouth daily.   ezetimibe (ZETIA) 10 MG tablet Take 10 mg by mouth at bedtime.   fenofibrate micronized (LOFIBRA) 134 MG capsule Take 134 mg by mouth at bedtime.   fluconazole (DIFLUCAN) 150 MG tablet Take 1 tablet (150 mg total) by mouth every 3 (three) days as needed (for vaginal itching/yeast infection sx).   gabapentin (NEURONTIN) 300 MG capsule Take 300 mg by mouth 2 (two) times daily.   gabapentin (NEURONTIN) 600 MG tablet Take 600 mg by mouth at bedtime.   GLYXAMBI 25-5 MG TABS Take 1 tablet by mouth daily.   HUMULIN R U-500 KWIKPEN 500 UNIT/ML kwikpen Inject 70 Units into the skin 3 (three) times daily with meals.   insulin aspart (NOVOLOG FLEXPEN) 100 UNIT/ML FlexPen Inject 3 Units subcutaneously. Sliding scale.   lamoTRIgine (LAMICTAL) 200 MG tablet Take 200 mg by mouth 2 (two) times daily.    lisinopril (ZESTRIL) 2.5 MG tablet Take 1 tablet (2.5 mg total) by mouth daily.   methocarbamol (ROBAXIN) 750 MG tablet methocarbamol 750 mg tablet  Take 1 tablet every 8 hours by oral route.   metoprolol tartrate (LOPRESSOR) 25 MG tablet Take 1 tablet (25 mg total) by mouth 2 (two) times daily.   nitrofurantoin, macrocrystal-monohydrate, (MACROBID) 100 MG capsule Take 1 capsule (100 mg total) by mouth 2 (two) times daily.   nitroGLYCERIN (NITROSTAT) 0.4 MG SL tablet Place 1 tablet (0.4 mg total) under the tongue every 5 (five) minutes as needed for chest pain.   OneTouch Delica Lancets 56P MISC by  Does not apply route.   promethazine (PHENERGAN) 12.5 MG tablet TAKE 1 TABLET BY MOUTH EVERY 6 HOURS AS NEEDED FOR NAUSEA OR VOMITING   QUEtiapine (SEROQUEL) 200 MG tablet Take 200 mg by mouth at bedtime. Along with 342m dose for a total of 5060m  QUEtiapine (SEROQUEL) 300 MG tablet Take 300 mg by mouth at bedtime. Along with 20047mose for a total of 500m18mSUMAtriptan (IMITREX) 100 MG tablet Take 1 tablet (100 mg total) by mouth once for 1 dose. May repeat in 2 hours if headache persists or recurs.   SYNTHROID 100 MCG tablet Take 100 mcg by mouth daily.   TRULICITY 1.5 MG/0VX/4.8AXN Inject 1.5 mg into the skin once a week. (Wednesdays)   No facility-administered encounter medications on file as of 03/16/2021.    Patient Active Problem List   Diagnosis Date Noted   Severe sepsis with septic shock (HCC)[place]/29/2022   Syncope 11/29/2020   Chronic congestive heart failure (HCC)[place]/11/2020   Iron deficiency anemia due to chronic blood loss    Polyp of ascending colon    Chronic gastric ulcer with hemorrhage    Vitamin B12 deficiency 08/08/2018   Patient is full code 10/30/2017   Coronary artery disease 10/09/2017   Plantar fascial fibromatosis 10/09/2017   Tendinitis of wrist 10/09/2017   Schizoaffective disorder, bipolar type (HCC)[place]/25/2019   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 09/28/2016   Breast cancer screening 09/28/2016   Acute lower UTI 03/14/2016   Adenomatous colon polyp 10/15/2015   Bipolar affective disorder (HCC)[place]/06/2016   CN (constipation) 10/15/2015   Drug-induced hepatic toxicity 10/15/2015   HLD (hyperlipidemia) 10/15/2015   Hypoxemia 10/15/2015   NASH (nonalcoholic steatohepatitis) 10/15/2015   Morbid obesity (HCC)[place]/06/2016   Restless leg syndrome 10/15/2015   Type 2 diabetes, uncontrolled, with neuropathy (HCC)[place]/06/2016   Elevated alkaline phosphatase level 09/22/2015   Low back pain with left-sided sciatica 08/18/2015   Apnea, sleep  05/05/2015   Heart valve disease 05/05/2015   Chronic kidney disease, stage III (moderate) (HCC)[place]/07/2015   Migraine 03/12/2015   COPD (chronic obstructive pulmonary disease) (HCC)[place]/18/2016   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HCC)[place]/18/2016   GERD (gastroesophageal reflux disease) 01/20/2015   Hypothyroid    Vitamin D deficiency    Urinary incontinence    Liver lesion    Hypomagnesemia    Obstructive sleep apnea 10/20/2013   PERIPHERAL NEUROPATHY 03/04/2009   INTERSTITIAL CYSTITIS 07/04/2007   Allergic rhinitis 07/04/2007    Conditions to be addressed/monitored:COPD and DMII  Care Plan : COPD (Adult)     Problem: Symptom Exacerbation (COPD)      Long-Range Goal: Symptom Exacerbation Prevented or Minimized   Start Date: 01/27/2021  Expected End Date: 05/27/2021  Priority: High   Current Barriers:  Chronic Disease Management support and educational needs r/t COPD  Case Manager Clinical Goal(s): Over the next 120 days, patient will not require hospitalization for complications r/t COPD exacerbation  Interventions:  Collaboration with Steele Sizer, MD regarding development and update of comprehensive plan of care as evidenced by provider attestation and co-signature Inter-disciplinary care team collaboration (see longitudinal plan of care) Reviewed medications and compliance with current treatment plan. Reports being diagnosed with Covid-19 approximately three weeks ago. Taking medications and using inhalers as prescribed. Reports episodes of shortness of breath with exertion. Denies episodes of shortness of breath at rest. Denies coughing or congestion. Still experiencing fatigue but reports recovering well. Reviewed current action plan and reinforced importance of daily self assessment. Advised to make an appointment if symptoms persist for 48 hours without improvement.  Reviewed information regarding infection prevention and increased risk r/t COPD.   Reviewed worsening  symptoms that require immediate medical attention.    Self-Care Activities/Patient Goals: -Take medications and utilize inhalers as prescribed -Assess symptoms daily and notify provider if symptoms persist.  -Avoid extreme temperatures and strenuous activities. -Follow recommendations to prevent respiratory infection -Notify provider or care management team with questions and new concerns as needed    Follow Up Plan:  Will follow up next month     Care Plan : Diabetes Type 2 (Adult)     Problem: Disease Progression (Diabetes, Type 2)      Long-Range Goal: Disease Progression Prevented or Minimized   Start Date: 01/27/2021  Expected End Date: 05/27/2021  Priority: High  Objective:  Lab Results  Component Value Date   HGBA1C 8.7 10/26/2020   Lab Results  Component Value Date   CREATININE 1.50 (H) 01/25/2021   CREATININE 0.96 12/03/2020   CREATININE 0.93 12/02/2020   No results found for: EGFR   Current Barriers:  Chronic disease management support and educational needs related to Diabetes self-management   Case Manager Clinical Goal(s):  Over the next 120 days, patient will demonstrate improved adherence to prescribed treatment plan for Diabetes management as evidenced by taking medications as prescribed, monitoring and recording CBGs, and adherence to an ADA/ carb modified diet.   Interventions:  Collaboration with Steele Sizer, MD regarding development and update of comprehensive plan of care as evidenced by provider attestation and co-signature Inter-disciplinary care team collaboration (see longitudinal plan of care) Reviewed medications blood glucose readings and compliance with treatment plan. Reports compliance with medications. Reports most blood glucose readings have been within range but recalls elevated readings over the past few weeks. Reports following the instructions/sliding scale for elevated levels as recommended. Reviewed s/sx of hypoglycemia and  hyperglycemia. Encouraged to continue monitoring and recording readings.  Reviewed nutritional intake and importance of compliance with a diabetic/carb modified diet.  Reviewed availability of resources r/t diabetes management. Declined current need for additional material.   Patient Goals/Self-Care Activities -Self-administer medications as prescribed -Utilize sliding scale insulin for elevated readings as prescribed -Attend all scheduled provider appointments -Monitor blood glucose levels consistently and utilize recommended interventions -Adhere to prescribed ADA/carb modified -Notify provider or care management team with questions and new concerns as needed   Follow Up Plan:  Will follow up next month     PLAN A member of the care management team will follow up with Mrs. Correira next month.    [facility] Care Management [facility] [phone]

## 2021-03-17 ENCOUNTER — Encounter: Payer: Self-pay | Admitting: Family Medicine

## 2021-03-23 NOTE — Patient Instructions (Signed)
Thank you for allowing the Chronic Care Management team to participate in your care.    Goals Addressed: Care Plan : COPD (Adult)     Problem: Symptom Exacerbation (COPD)      Long-Range Goal: Symptom Exacerbation Prevented or Minimized   Start Date: 01/27/2021  Expected End Date: 05/27/2021  Priority: High   Current Barriers:  Chronic Disease Management support and educational needs r/t COPD  Case Manager Clinical Goal(s): Over the next 120 days, patient will not require hospitalization for complications r/t COPD exacerbation   Interventions:  Collaboration with Steele Sizer, MD regarding development and update of comprehensive plan of care as evidenced by provider attestation and co-signature Inter-disciplinary care team collaboration (see longitudinal plan of care) Reviewed medications and compliance with current treatment plan. Reports being diagnosed with Covid-19 approximately three weeks ago. Taking medications and using inhalers as prescribed. Reports episodes of shortness of breath with exertion. Denies episodes of shortness of breath at rest. Denies coughing or congestion. Still experiencing fatigue but reports recovering well. Reviewed current action plan and reinforced importance of daily self assessment. Advised to make an appointment if symptoms persist for 48 hours without improvement.  Reviewed information regarding infection prevention and increased risk r/t COPD.   Reviewed worsening symptoms that require immediate medical attention.    Self-Care Activities/Patient Goals: -Take medications and utilize inhalers as prescribed -Assess symptoms daily and notify provider if symptoms persist.  -Avoid extreme temperatures and strenuous activities. -Follow recommendations to prevent respiratory infection -Notify provider or care management team with questions and new concerns as needed    Follow Up Plan:  Will follow up next month     Care Plan : Diabetes Type  2 (Adult)     Problem: Disease Progression (Diabetes, Type 2)      Long-Range Goal: Disease Progression Prevented or Minimized   Start Date: 01/27/2021  Expected End Date: 05/27/2021  Priority: High  Objective:  Lab Results  Component Value Date   HGBA1C 8.7 10/26/2020   Lab Results  Component Value Date   CREATININE 1.50 (H) 01/25/2021   CREATININE 0.96 12/03/2020   CREATININE 0.93 12/02/2020   No results found for: EGFR   Current Barriers:  Chronic disease management support and educational needs related to Diabetes self-management   Case Manager Clinical Goal(s):  Over the next 120 days, patient will demonstrate improved adherence to prescribed treatment plan for Diabetes management as evidenced by taking medications as prescribed, monitoring and recording CBGs, and adherence to an ADA/ carb modified diet.   Interventions:  Collaboration with Steele Sizer, MD regarding development and update of comprehensive plan of care as evidenced by provider attestation and co-signature Inter-disciplinary care team collaboration (see longitudinal plan of care) Reviewed medications blood glucose readings and compliance with treatment plan. Reports compliance with medications. Reports most blood glucose readings have been within range but recalls elevated readings over the past few weeks. Reports following the instructions/sliding scale for elevated levels as recommended. Reviewed s/sx of hypoglycemia and hyperglycemia. Encouraged to continue monitoring and recording readings.  Reviewed nutritional intake and importance of compliance with a diabetic/carb modified diet.  Reviewed availability of resources r/t diabetes management. Declined current need for additional material.   Patient Goals/Self-Care Activities -Self-administer medications as prescribed -Utilize sliding scale insulin for elevated readings as prescribed -Attend all scheduled provider appointments -Monitor blood  glucose levels consistently and utilize recommended interventions -Adhere to prescribed ADA/carb modified -Notify provider or care management team with questions and new concerns as needed  Follow Up Plan:  Will follow up next month      Mrs. Kost verbalized understanding of the information discussed during the telephonic outreach. Declined need for mailed/printed instructions. A member of the care management team will follow up next month.    [facility] Care Management [facility] [phone]

## 2021-03-25 NOTE — Progress Notes (Deleted)
Name: [name]   MRN: [id number]    DOB: 1966-03-02   Date:03/28/2021       Progress Note  Subjective  Chief Complaint  Follow Up  HPI  RLS: she states she has been on Gabapentin  for a long time, she states dose was increased in December 2020 She has an urge to move it at night, both feet feels on fire , sometimes also jumps during the day but mostly at night.  It causes difficulty for her to fall asleep. He magnesium was low in the past and advised to take otc supplementation also    Hypersomnolence: she states positive for OSA however unable to find a mask that fits her face and is only on nocturnal oxygen she states oxygen has helped    DMII: seeing Dr. Ronnald Collum, A1C down from 10.6 % in January 2022 to  8.7 % a month later, last value was 7.7 % on 02/03/2021   She has episodes of  polyphagia, polydipsia or polyuria. She is complaint with her medications, today she use insulin at noon and forgot to eat, when she arrived to our office her glucose was down to 47, we gave her chocolate and a glass of orange juice. Explained importance of eating shortly after an insulin dose.   She states since last visit she has changed her diet, she has switched to water and eating smaller amounts. She has obesity and neuropathy. She was reminded to have yearly eye exam.  Dr. Ronnald Collum  is giving her  to 70 unit TID and sliding scare with all regular insulin. Discussed her medications, she is willing to have a second opinion, she is willing to go to [facility].    Hypothyroidism: managed by Dr. Ronnald Collum, she denies dysphagia, no dry skin, no change in bowel movements    Schizoaffective disorder/Bipolar : she is no longer seeing Dr. Nicolasa Ducking under the care of [facility]  , she is compliant with medications and visits. Unable to go to [facility] due to her insurance   CHF with preserved EF: recently seen by Dr. Rockey Situ, has to use oxygen at night 2 liters. She has SOB with mild activity. She is on statin  therapy, ARB, beta blocker and denies side effects of medication Denies orthopnea.   Vitamin B12 and Vtiamin D deficiency: taking supplementation  Morbid obesity: BMI of 40,she is eating healthier, discussed physical activity   CKI: she sees Dr. Holley Raring her last GFR last month was  a little lower at 23  she is on ARB and glucose is getting better controlled, good urine output and no pruritis   Atherosclerosis of aorta: continue medications, on statin therapy and zetia, LDL is at goal at 44   [facility] Patient Active Problem List   Diagnosis Date Noted   Severe sepsis with septic shock ([place]) 11/30/2020   Syncope 11/29/2020   Chronic congestive heart failure ([place]) 11/04/2020   Iron deficiency anemia due to chronic blood loss    Polyp of ascending colon    Chronic gastric ulcer with hemorrhage    Vitamin B12 deficiency 08/08/2018   Patient is full code 10/30/2017   Coronary artery disease 10/09/2017   Plantar fascial fibromatosis 10/09/2017   Tendinitis of wrist 10/09/2017   Schizoaffective disorder, bipolar type ([place]) 09/28/2017   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 09/28/2016   Breast cancer screening 09/28/2016   Acute lower UTI 03/14/2016   Adenomatous colon polyp 10/15/2015   Bipolar affective disorder ([place])  10/15/2015   CN (constipation) 10/15/2015   Drug-induced hepatic toxicity 10/15/2015   HLD (hyperlipidemia) 10/15/2015   Hypoxemia 10/15/2015   NASH (nonalcoholic steatohepatitis) 10/15/2015   Morbid obesity ([place]) 10/15/2015   Restless leg syndrome 10/15/2015   Type 2 diabetes, uncontrolled, with neuropathy ([place]) 10/15/2015   Elevated alkaline phosphatase level 09/22/2015   Low back pain with left-sided sciatica 08/18/2015   Apnea, sleep 05/05/2015   Heart valve disease 05/05/2015   Chronic kidney disease, stage III (moderate) ([place]) 03/15/2015   Migraine 03/12/2015   COPD (chronic obstructive pulmonary disease) ([place]) 01/20/2015   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 tacking of bladder and the other was the stimulator placement   CHOLECYSTECTOMY     COLONOSCOPY WITH PROPOFOL N/A 03/04/2019   Procedure: COLONOSCOPY WITH PROPOFOL;  Surgeon: [name], MD;  Location: [facility] ENDOSCOPY;  Service: Endoscopy;  Laterality: N/A;   ESOPHAGOGASTRODUODENOSCOPY (EGD) WITH PROPOFOL N/A 03/04/2019   Procedure: ESOPHAGOGASTRODUODENOSCOPY (EGD) WITH PROPOFOL;  Surgeon: [name], MD;  Location: [facility] ENDOSCOPY;  Service: Endoscopy;  Laterality: N/A;   INCISION AND DRAINAGE ABSCESS Right 04/16/2019   Procedure: INCISION AND DRAINAGE ABSCESS;  Surgeon: Benjamine Sprague, DO;  Location: [facility] ORS;  Service: General;  Laterality: Right;   INTERSTIM-GENERATOR CHANGE N/A 12/31/2017   Procedure: INTERSTIM-GENERATOR CHANGE;  Surgeon: Bjorn Loser, MD;  Location: [facility] ORS;  Service: Urology;  Laterality: N/A;   LIVER BIOPSY  March 2016   Negative   LUNG BIOPSY  2015   small spot on lung   TUBAL LIGATION     UPPER GI ENDOSCOPY     WRIST SURGERY Right 11/02/2019    Family History  Problem Relation Age of Onset   Diabetes Mother    Heart disease Mother    Hyperlipidemia Mother    Hypertension Mother    Kidney disease Mother    Mental illness Mother    Hypothyroidism Mother    Stroke Mother    Osteoporosis Mother    Glaucoma Mother    Congestive Heart Failure Mother    Hypertension Father    Asthma Daughter    Cancer Daughter        throat   Bipolar disorder Daughter    Heart disease Maternal Grandfather    Rheum arthritis  Maternal Grandfather    Cancer Maternal Grandfather        liver   Kidney disease Maternal Grandfather    Cancer Paternal Grandmother        lung   Kidney disease Brother    Breast cancer Maternal Grandmother 67   Bipolar disorder Daughter    Hypertension Daughter    Restless legs syndrome Daughter     Social History   Tobacco Use   Smoking status: Never   Smokeless tobacco: Never  Substance Use Topics   Alcohol use: No     Current Outpatient Medications:    atorvastatin (LIPITOR) 80 MG tablet, Take 80 mg by mouth at bedtime. , Disp: , Rfl:  busPIRone (BUSPAR) 30 MG tablet, Take 30 mg by mouth 2 (two) times daily., Disp: , Rfl:    citalopram (CELEXA) 20 MG tablet, Take 20 mg by mouth daily., Disp: , Rfl:    dexlansoprazole (DEXILANT) 60 MG capsule, Take 1 capsule (60 mg total) by mouth daily., Disp: 30 capsule, Rfl: 6   ezetimibe (ZETIA) 10 MG tablet, Take 10 mg by mouth at bedtime., Disp: , Rfl:    fenofibrate micronized (LOFIBRA) 134 MG capsule, Take 134 mg by mouth at bedtime., Disp: , Rfl:    fluconazole (DIFLUCAN) 150 MG tablet, Take 1 tablet (150 mg total) by mouth every 3 (three) days as needed (for vaginal itching/yeast infection sx)., Disp: 2 tablet, Rfl: 0   gabapentin (NEURONTIN) 300 MG capsule, Take 300 mg by mouth 2 (two) times daily., Disp: , Rfl:    gabapentin (NEURONTIN) 600 MG tablet, Take 600 mg by mouth at bedtime., Disp: , Rfl:    GLYXAMBI 25-5 MG TABS, Take 1 tablet by mouth daily., Disp: , Rfl:    HUMULIN R U-500 KWIKPEN 500 UNIT/ML kwikpen, Inject 70 Units into the skin 3 (three) times daily with meals., Disp: , Rfl:    insulin aspart (NOVOLOG FLEXPEN) 100 UNIT/ML FlexPen, Inject 3 Units subcutaneously. Sliding scale., Disp: , Rfl:    lamoTRIgine (LAMICTAL) 200 MG tablet, Take 200 mg by mouth 2 (two) times daily. , Disp: , Rfl:    lisinopril (ZESTRIL) 2.5 MG tablet, Take 1 tablet (2.5 mg total) by mouth daily., Disp: 90 tablet, Rfl: 0   methocarbamol  (ROBAXIN) 750 MG tablet, methocarbamol 750 mg tablet  Take 1 tablet every 8 hours by oral route., Disp: , Rfl:    metoprolol tartrate (LOPRESSOR) 25 MG tablet, Take 1 tablet (25 mg total) by mouth 2 (two) times daily., Disp: 180 tablet, Rfl: 1   nitrofurantoin, macrocrystal-monohydrate, (MACROBID) 100 MG capsule, Take 1 capsule (100 mg total) by mouth 2 (two) times daily., Disp: 14 capsule, Rfl: 0   nitroGLYCERIN (NITROSTAT) 0.4 MG SL tablet, Place 1 tablet (0.4 mg total) under the tongue every 5 (five) minutes as needed for chest pain., Disp: 25 tablet, Rfl: 0   OneTouch Delica Lancets 76L MISC, by Does not apply route., Disp: , Rfl:    promethazine (PHENERGAN) 12.5 MG tablet, TAKE 1 TABLET BY MOUTH EVERY 6 HOURS AS NEEDED FOR NAUSEA OR VOMITING, Disp: 30 tablet, Rfl: 1   QUEtiapine (SEROQUEL) 200 MG tablet, Take 200 mg by mouth at bedtime. Along with 379m dose for a total of 5054m Disp: , Rfl:    QUEtiapine (SEROQUEL) 300 MG tablet, Take 300 mg by mouth at bedtime. Along with 20050mose for a total of 500m18misp: , Rfl:    SYNTHROID 100 MCG tablet, Take 100 mcg by mouth daily., Disp: , Rfl:    TRULICITY 1.5 MG/0YY/5.0PTN, Inject 1.5 mg into the skin once a week. (Wednesdays), Disp: , Rfl:    SUMAtriptan (IMITREX) 100 MG tablet, Take 1 tablet (100 mg total) by mouth once for 1 dose. May repeat in 2 hours if headache persists or recurs., Disp: 10 tablet, Rfl: 0  Allergies  Allergen Reactions   Mirtazapine Anaphylaxis    REACTION: closes throat   Morphine And Related     anaphylaxis   Sulfamethoxazole-Trimethoprim Rash    REACTION: Whelps all over   Vicodin Hp [Hydrocodone-Acetaminophen]    Amoxicillin Er Nausea And Vomiting    Pt doesn't remember problem with amoxicillin -  SE Has patient  had a PCN reaction causing immediate rash, facial/tongue/throat swelling, SOB or lightheadedness with hypotension: No Has patient had a PCN reaction causing severe rash involving mucus membranes or skin  necrosis: No Has patient had a PCN reaction that required hospitalization: No Has patient had a PCN reaction occurring within the last 10 years: Yes If all of the above answers are "NO", then may proceed with Cephalosporin use.   Codeine Itching    REACTION: itch, rash   Hydrocodone-Acetaminophen Rash    REACTION: rash   Keflex [Cephalexin] Rash    Pt does not remember having a reaction or rash, no airway involvement   Prasterone Nausea And Vomiting    I personally reviewed active problem list, medication list, allergies, family history, social history, health maintenance with the patient/caregiver today.   ROS  Constitutional: Negative for fever or weight change.  Respiratory: Negative for cough and shortness of breath.   Cardiovascular: Negative for chest pain or palpitations.  Gastrointestinal: Negative for abdominal pain, no bowel changes.  Musculoskeletal: Negative for gait problem or joint swelling.  Skin: Negative for rash.  Neurological: Negative for dizziness or headache.  No other specific complaints in a complete review of systems (except as listed in HPI above).   Objective  Vitals:   03/28/21 1456  BP: 130/70  Pulse: 82  Resp: 16  Temp: 98.1 F (36.7 C)  TempSrc: Oral  SpO2: 96%  Weight: 190 lb (86.2 kg)  Height: 4' 11"  (1.499 m)    Body mass index is 38.38 kg/m.  Physical Exam  Constitutional: Patient appears well-developed and well-nourished.  No distress.  HEENT: head atraumatic, normocephalic, pupils equal and reactive to light,  neck supple Cardiovascular: Normal rate, regular rhythm and normal heart sounds.  No murmur heard. No BLE edema. Pulmonary/Chest: Effort normal and breath sounds normal. No respiratory distress. Abdominal: Soft.  There is no tenderness. Psychiatric: Patient has a normal mood and affect. behavior is normal. Judgment and thought content normal.    Recent Results (from the past 2160 hour(s))  POCT Urinalysis Dipstick      Status: Abnormal   Collection Time: 01/04/21 12:26 PM  Result Value Ref Range   Color, UA Yellow    Clarity, UA Clear    Glucose, UA Positive (A) Negative   Bilirubin, UA Negative    Ketones, UA Negative    Spec Grav, UA 1.020 1.010 - 1.025   Blood, UA Negative    pH, UA 5.0 5.0 - 8.0   Protein, UA Negative Negative   Urobilinogen, UA 0.2 0.2 or 1.0 E.U./dL   Nitrite, UA Negative    Leukocytes, UA Negative Negative   Appearance     Odor    Urine Culture     Status: None   Collection Time: 01/04/21 12:39 PM   Specimen: Urine  Result Value Ref Range   MICRO NUMBER: [id number]    SPECIMEN QUALITY: Adequate    Sample Source URINE    STATUS: FINAL    ISOLATE 1:      Mixed genital flora isolated. These superficial bacteria are not indicative of a urinary tract infection. No further organism identification is warranted on this specimen. If clinically indicated, recollect clean-catch, mid-stream urine and transfer  immediately to Urine Culture Transport Tube.   Urinalysis, Complete w Microscopic Urine, Random     Status: Abnormal   Collection Time: 01/25/21 10:53 AM  Result Value Ref Range   Color, Urine YELLOW (A) YELLOW   APPearance CLEAR (A) CLEAR  Specific Gravity, Urine 1.027 1.005 - 1.030   pH 5.0 5.0 - 8.0   Glucose, UA >=500 (A) NEGATIVE mg/dL   Hgb urine dipstick NEGATIVE NEGATIVE   Bilirubin Urine NEGATIVE NEGATIVE   Ketones, ur NEGATIVE NEGATIVE mg/dL   Protein, ur NEGATIVE NEGATIVE mg/dL   Nitrite NEGATIVE NEGATIVE   Leukocytes,Ua NEGATIVE NEGATIVE   RBC / HPF 0-5 0 - 5 RBC/hpf   WBC, UA 0-5 0 - 5 WBC/hpf   Bacteria, UA RARE (A) NONE SEEN   Squamous Epithelial / LPF 0-5 0 - 5   Mucus PRESENT     Comment: Performed at [facility], [address] [place], [place] [address]  Basic metabolic panel     Status: Abnormal   Collection Time: 01/25/21 12:41 PM  Result Value Ref Range   Sodium 140 135 - 145 mmol/L   Potassium 4.3 3.5 - 5.1 mmol/L   Chloride  101 98 - 111 mmol/L   CO2 27 22 - 32 mmol/L   Glucose, Bld 207 (H) 70 - 99 mg/dL    Comment: Glucose reference range applies only to samples taken after fasting for at least 8 hours.   BUN 27 (H) 6 - 20 mg/dL   Creatinine, Ser 1.50 (H) 0.44 - 1.00 mg/dL   Calcium 13.2 (HH) 8.9 - 10.3 mg/dL    Comment: CRITICAL RESULT CALLED TO, READ BACK BY AND VERIFIED WITH LINDA MCLAMB 1324 01/25/2021 DLB    GFR, Estimated 40 (L) >60 mL/min    Comment: (NOTE) Calculated using the CKD-EPI Creatinine Equation (2021)    Anion gap 12 5 - 15    Comment: Performed at [facility], [address] [place], [place] [address]  CBC with Differential     Status: None   Collection Time: 01/25/21 12:41 PM  Result Value Ref Range   WBC 9.0 4.0 - 10.5 K/uL   RBC 4.37 3.87 - 5.11 MIL/uL   Hemoglobin 12.9 12.0 - 15.0 g/dL   HCT 40.5 36.0 - 46.0 %   MCV 92.7 80.0 - 100.0 fL   MCH 29.5 26.0 - 34.0 pg   MCHC 31.9 30.0 - 36.0 g/dL   RDW 13.1 11.5 - 15.5 %   Platelets 277 150 - 400 K/uL   nRBC 0.0 0.0 - 0.2 %   Neutrophils Relative % 59 %   Neutro Abs 5.3 1.7 - 7.7 K/uL   Lymphocytes Relative 32 %   Lymphs Abs 2.8 0.7 - 4.0 K/uL   Monocytes Relative 6 %   Monocytes Absolute 0.5 0.1 - 1.0 K/uL   Eosinophils Relative 2 %   Eosinophils Absolute 0.2 0.0 - 0.5 K/uL   Basophils Relative 0 %   Basophils Absolute 0.0 0.0 - 0.1 K/uL   Immature Granulocytes 1 %   Abs Immature Granulocytes 0.05 0.00 - 0.07 K/uL    Comment: Performed at [facility], [address] [place], [place] [address]  Hemoglobin A1c     Status: None   Collection Time: 02/03/21 12:00 AM  Result Value Ref Range   Hemoglobin A1C 7.7   Novel Coronavirus, NAA ([facility])     Status: Abnormal   Collection Time: 02/14/21 12:00 AM   Specimen: Nasopharyngeal(NP) swabs in vial transport medium   Nasopharynge  Result Value Ref Range   SARS-CoV-2, NAA Detected (A) Not Detected    Comment: Patients who have a positive COVID-19 test  result may now have treatment options. Treatment options are available for patients with mild  to moderate symptoms and for hospitalized patients. Visit our website at [email or web address] for resources and information. This nucleic acid amplification test was developed and its performance characteristics determined by [employer]. Nucleic acid amplification tests include RT-PCR and TMA. This test has not been FDA cleared or approved. This test has been authorized by FDA under an Emergency Use Authorization (EUA). This test is only authorized for the duration of time the declaration that circumstances exist justifying the authorization of the emergency use of in vitro diagnostic tests for detection of SARS-CoV-2 virus and/or diagnosis of COVID-19 infection under section 564(b)(1) of the Act, 21 U.S.C. 716RCV-8(L) (1), unless the authorization is terminated or revoked sooner. When diagnostic testing is negativ e, the possibility of a false negative result should be considered in the context of a patient's recent exposures and the presence of clinical signs and symptoms consistent with COVID-19. An individual without symptoms of COVID-19 and who is not shedding SARS-CoV-2 virus would expect to have a negative (not detected) result in this assay.   SARS-COV-2, NAA 2 DAY TAT     Status: None   Collection Time: 02/14/21 12:00 AM   Nasopharynge  Result Value Ref Range   SARS-CoV-2, NAA 2 DAY TAT Performed   Specimen status report     Status: None   Collection Time: 02/14/21 12:00 AM  Result Value Ref Range   specimen status report Comment     Comment: Please note Please note The date and/or time of collection was not indicated on the requisition as required by state and federal law.  The date of receipt of the specimen was used as the collection date if not supplied.   COMPLETE METABOLIC PANEL WITH GFR     Status: Abnormal   Collection Time: 02/14/21  3:13 PM   Result Value Ref Range   Glucose, Bld 261 (H) 65 - 99 mg/dL    Comment: .            Fasting reference interval . For someone without known diabetes, a glucose value >125 mg/dL indicates that they may have diabetes and this should be confirmed with a follow-up test. .    BUN 24 7 - 25 mg/dL   Creat 1.37 (H) 0.50 - 1.05 mg/dL    Comment: For patients >39 years of age, the reference limit for Creatinine is approximately 13% higher for people identified as African-American. .    GFR, Est Non African American 43 (L) > OR = 60 mL/min/1.71m   GFR, Est African American 49 (L) > OR = 60 mL/min/1.73m  BUN/Creatinine Ratio 18 6 - 22 (calc)   Sodium 140 135 - 146 mmol/L   Potassium 4.4 3.5 - 5.3 mmol/L   Chloride 106 98 - 110 mmol/L   CO2 23 20 - 32 mmol/L   Calcium 9.5 8.6 - 10.4 mg/dL   Total Protein 6.1 6.1 - 8.1 g/dL   Albumin 3.9 3.6 - 5.1 g/dL   Globulin 2.2 1.9 - 3.7 g/dL (calc)   AG Ratio 1.8 1.0 - 2.5 (calc)   Total Bilirubin 0.2 0.2 - 1.2 mg/dL   Alkaline phosphatase (APISO) 43 37 - 153 U/L   AST 26 10 - 35 U/L   ALT 20 6 - 29 U/L  POCT urinalysis dipstick     Status: Abnormal   Collection Time: 03/10/21  3:15 PM  Result Value Ref Range   Color, UA yellow    Clarity, UA cloudy    Glucose, UA Positive (A)  Negative   Bilirubin, UA negative    Ketones, UA negative    Spec Grav, UA 1.020 1.010 - 1.025   Blood, UA small    pH, UA 5.0 5.0 - 8.0   Protein, UA Positive (A) Negative   Urobilinogen, UA 0.2 0.2 or 1.0 E.U./dL   Nitrite, UA negative    Leukocytes, UA Trace (A) Negative   Appearance clear    Odor none   Urine Culture     Status: None   Collection Time: 03/10/21  3:29 PM   Specimen: Urine  Result Value Ref Range   MICRO NUMBER: [id number]    SPECIMEN QUALITY: Adequate    Sample Source URINE    STATUS: FINAL    Result:      Mixed genital flora isolated. These superficial bacteria are not indicative of a urinary tract infection. No further organism  identification is warranted on this specimen. If clinically indicated, recollect clean-catch, mid-stream urine and transfer  immediately to Urine Culture Transport Tube.     Diabetic Foot Exam: Diabetic Foot Exam - Simple   No data filed    ***  PHQ2/9: Depression screen [facility] 2/9 03/28/2021 03/10/2021 02/14/2021 02/01/2021 01/27/2021  Decreased Interest 2 2 0 1 0  Down, Depressed, Hopeless 1 0 0 1 0  PHQ - 2 Score 3 2 0 2 0  Altered sleeping 0 0 0 0 -  Tired, decreased energy 2 0 0 2 -  Change in appetite 0 0 0 3 -  Feeling bad or failure about yourself  0 0 0 0 -  Trouble concentrating 0 0 0 1 -  Moving slowly or fidgety/restless 0 0 0 1 -  Suicidal thoughts 0 0 0 0 -  PHQ-9 Score 5 2 0 9 -  Difficult doing work/chores - Not difficult at all Not difficult at all Somewhat difficult -  Some recent data might be hidden    phq 9 is positive   Fall Risk: Fall Risk  03/28/2021 03/10/2021 02/14/2021 02/01/2021 01/27/2021  Falls in the past year? 1 0 0 0 0  Comment - - - - -  Number falls in past yr: 0 0 0 0 0  Comment - - - - -  Injury with Fall? 0 0 0 0 -  Comment - - - - -  Risk for fall due to : - - - No Fall Risks Medication side effect  Follow up - Falls evaluation completed - Falls prevention discussed Falls prevention discussed     Functional Status Survey: Is the patient deaf or have difficulty hearing?: No Does the patient have difficulty seeing, even when wearing glasses/contacts?: Yes Does the patient have difficulty concentrating, remembering, or making decisions?: Yes Does the patient have difficulty walking or climbing stairs?: Yes Does the patient have difficulty dressing or bathing?: No Does the patient have difficulty doing errands alone such as visiting a doctor's office or shopping?: No    Assessment & Plan

## 2021-03-28 ENCOUNTER — Encounter: Payer: Self-pay | Admitting: Family Medicine

## 2021-03-28 ENCOUNTER — Other Ambulatory Visit: Payer: Self-pay | Admitting: Family Medicine

## 2021-03-28 ENCOUNTER — Ambulatory Visit (INDEPENDENT_AMBULATORY_CARE_PROVIDER_SITE_OTHER): Payer: Medicare Other | Admitting: Family Medicine

## 2021-03-28 ENCOUNTER — Other Ambulatory Visit: Payer: Self-pay

## 2021-03-28 VITALS — BP 130/70 | HR 82 | Temp 98.1°F | Resp 16 | Ht 59.0 in | Wt 190.0 lb

## 2021-03-28 DIAGNOSIS — G2581 Restless legs syndrome: Secondary | ICD-10-CM

## 2021-03-28 DIAGNOSIS — I700 Atherosclerosis of aorta: Secondary | ICD-10-CM

## 2021-03-28 DIAGNOSIS — G43009 Migraine without aura, not intractable, without status migrainosus: Secondary | ICD-10-CM

## 2021-03-28 DIAGNOSIS — D803 Selective deficiency of immunoglobulin G [IgG] subclasses: Secondary | ICD-10-CM

## 2021-03-28 DIAGNOSIS — J449 Chronic obstructive pulmonary disease, unspecified: Secondary | ICD-10-CM

## 2021-03-28 DIAGNOSIS — I25118 Atherosclerotic heart disease of native coronary artery with other forms of angina pectoris: Secondary | ICD-10-CM

## 2021-03-28 DIAGNOSIS — E669 Obesity, unspecified: Secondary | ICD-10-CM

## 2021-03-28 DIAGNOSIS — E1140 Type 2 diabetes mellitus with diabetic neuropathy, unspecified: Secondary | ICD-10-CM

## 2021-03-28 DIAGNOSIS — G4733 Obstructive sleep apnea (adult) (pediatric): Secondary | ICD-10-CM

## 2021-03-28 DIAGNOSIS — E1142 Type 2 diabetes mellitus with diabetic polyneuropathy: Secondary | ICD-10-CM

## 2021-03-28 DIAGNOSIS — E538 Deficiency of other specified B group vitamins: Secondary | ICD-10-CM

## 2021-03-28 DIAGNOSIS — N183 Chronic kidney disease, stage 3 unspecified: Secondary | ICD-10-CM

## 2021-03-28 DIAGNOSIS — E1169 Type 2 diabetes mellitus with other specified complication: Secondary | ICD-10-CM

## 2021-03-28 DIAGNOSIS — E039 Hypothyroidism, unspecified: Secondary | ICD-10-CM

## 2021-03-28 DIAGNOSIS — F250 Schizoaffective disorder, bipolar type: Secondary | ICD-10-CM

## 2021-03-28 DIAGNOSIS — N1832 Chronic kidney disease, stage 3b: Secondary | ICD-10-CM

## 2021-03-28 MED ORDER — SUMATRIPTAN SUCCINATE 100 MG PO TABS: 100.0000 mg | ORAL_TABLET | Freq: Once | ORAL | 0 refills | Status: DC

## 2021-03-28 NOTE — Progress Notes (Deleted)
Name: [name]   MRN: [id number]    DOB: 1966-08-06   Date:03/28/2021       Progress Note  Subjective  Chief Complaint  Follow Up  HPI  RLS: she states she has been on Gabapentin  for a long time, she states dose was increased in December 2020 She has an urge to move it at night, both feet feels on fire , sometimes also jumps during the day but mostly at night.  It causes difficulty for her to fall asleep. He magnesium was low in the past and advised to take otc supplementation also    Hypersomnolence: she states positive for OSA however unable to find a mask that fits her face and is only on nocturnal oxygen she states oxygen has helped    DMII: seeing Dr. Ronnald Collum, A1C down from 10.6 % in January 2022 to  8.7 % a month later, last value was 7.7 % on 02/03/2021   She has episodes of  polyphagia, polydipsia or polyuria. She is complaint with her medications, today she use insulin at noon and forgot to eat, when she arrived to our office her glucose was down to 47, we gave her chocolate and a glass of orange juice. Explained importance of eating shortly after an insulin dose.   She states since last visit she has changed her diet, she has switched to water and eating smaller amounts. She has obesity and neuropathy. She was reminded to have yearly eye exam.  Dr. Ronnald Collum  is giving her  to 70 unit TID and sliding scare with all regular insulin. Discussed her medications, she is willing to have a second opinion, she is willing to go to [place].    Hypothyroidism: managed by Dr. Ronnald Collum, she denies dysphagia, no dry skin, no change in bowel movements    Schizoaffective disorder/Bipolar : she is no longer seeing Dr. Nicolasa Ducking under the care of [facility]  , she is compliant with medications and visits. Unable to go to [facility] due to her insurance   CHF with preserved EF: recently seen by Dr. Rockey Situ, has to use oxygen at night 2 liters. She has SOB with mild activity. She is on statin  therapy, ARB, beta blocker and denies side effects of medication Denies orthopnea.   Vitamin B12 and Vtiamin D deficiency: taking supplementation  Morbid obesity: BMI of 40,she is eating healthier, discussed physical activity   CKI: she sees Dr. Holley Raring her last GFR last month was  a little lower at 33  she is on ARB and glucose is getting better controlled, good urine output and no pruritis   Atherosclerosis of aorta: continue medications, on statin therapy and zetia, LDL is at goal at 50   Migraine headaches: episodes have been more often, a few times a week. She describes headaches as aching or tightness, has photophobia and phonophobia , she needs refill of Imitrex  Patient Active Problem List   Diagnosis Date Noted   Severe sepsis with septic shock ([place]) 11/30/2020   Syncope 11/29/2020   Chronic congestive heart failure ([place]) 11/04/2020   Iron deficiency anemia due to chronic blood loss    Polyp of ascending colon    Chronic gastric ulcer with hemorrhage    Vitamin B12 deficiency 08/08/2018   Patient is full code 10/30/2017   Coronary artery disease 10/09/2017   Plantar fascial fibromatosis 10/09/2017   Tendinitis of wrist 10/09/2017   Schizoaffective disorder, bipolar type ([place]) 09/28/2017   Neoplasm of uncertain behavior  of skin of ear 09/28/2016   Breast cancer screening 09/28/2016   Acute lower UTI 03/14/2016   Adenomatous colon polyp 10/15/2015   Bipolar affective disorder ([place]) 10/15/2015   CN (constipation) 10/15/2015   Drug-induced hepatic toxicity 10/15/2015   HLD (hyperlipidemia) 10/15/2015   Hypoxemia 10/15/2015   NASH (nonalcoholic steatohepatitis) 10/15/2015   Morbid obesity ([place]) 10/15/2015   Restless leg syndrome 10/15/2015   Type 2 diabetes, uncontrolled, with neuropathy ([place]) 10/15/2015   Elevated alkaline phosphatase level 09/22/2015   Low back pain with left-sided sciatica 08/18/2015   Apnea, sleep 05/05/2015   Heart valve disease 05/05/2015   Chronic  kidney disease, stage III (moderate) ([place]) 03/15/2015   Migraine 03/12/2015   COPD (chronic obstructive pulmonary disease) ([place]) 01/20/2015   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 tacking of bladder and the other was the stimulator placement   CHOLECYSTECTOMY     COLONOSCOPY WITH PROPOFOL N/A 03/04/2019   Procedure: COLONOSCOPY WITH PROPOFOL;  Surgeon: [name], MD;  Location: [facility] ENDOSCOPY;  Service: Endoscopy;  Laterality: N/A;   ESOPHAGOGASTRODUODENOSCOPY (EGD) WITH PROPOFOL N/A 03/04/2019   Procedure: ESOPHAGOGASTRODUODENOSCOPY (EGD) WITH PROPOFOL;  Surgeon: [name], MD;  Location: [facility] ENDOSCOPY;  Service: Endoscopy;  Laterality: N/A;   INCISION AND DRAINAGE ABSCESS Right 04/16/2019   Procedure: INCISION AND DRAINAGE ABSCESS;  Surgeon: Benjamine Sprague, DO;  Location: [facility] ORS;  Service: General;  Laterality: Right;   INTERSTIM-GENERATOR CHANGE N/A 12/31/2017   Procedure: INTERSTIM-GENERATOR CHANGE;  Surgeon: Bjorn Loser, MD;  Location: [facility] ORS;  Service: Urology;  Laterality: N/A;   LIVER BIOPSY  March 2016   Negative   LUNG BIOPSY  2015   small spot on lung   TUBAL LIGATION     UPPER GI ENDOSCOPY     WRIST SURGERY Right 11/02/2019    Family History  Problem Relation Age of Onset   Diabetes Mother    Heart disease Mother    Hyperlipidemia Mother    Hypertension Mother    Kidney disease Mother    Mental illness Mother    Hypothyroidism Mother    Stroke Mother    Osteoporosis Mother    Glaucoma Mother    Congestive Heart  Failure Mother    Hypertension Father    Asthma Daughter    Cancer Daughter        throat   Bipolar disorder Daughter    Heart disease Maternal Grandfather    Rheum arthritis Maternal Grandfather    Cancer Maternal Grandfather        liver   Kidney disease Maternal Grandfather    Cancer Paternal Grandmother        lung   Kidney disease Brother    Breast cancer Maternal Grandmother 75   Bipolar disorder Daughter    Hypertension Daughter    Restless legs syndrome Daughter     Social History   Tobacco Use   Smoking status: Never   Smokeless tobacco: Never  Substance Use Topics   Alcohol use: No  Current Outpatient Medications:    atorvastatin (LIPITOR) 80 MG tablet, Take 80 mg by mouth at bedtime. , Disp: , Rfl:    busPIRone (BUSPAR) 30 MG tablet, Take 30 mg by mouth 2 (two) times daily., Disp: , Rfl:    citalopram (CELEXA) 20 MG tablet, Take 20 mg by mouth daily., Disp: , Rfl:    dexlansoprazole (DEXILANT) 60 MG capsule, Take 1 capsule (60 mg total) by mouth daily., Disp: 30 capsule, Rfl: 6   ezetimibe (ZETIA) 10 MG tablet, Take 10 mg by mouth at bedtime., Disp: , Rfl:    fenofibrate micronized (LOFIBRA) 134 MG capsule, Take 134 mg by mouth at bedtime., Disp: , Rfl:    fluconazole (DIFLUCAN) 150 MG tablet, Take 1 tablet (150 mg total) by mouth every 3 (three) days as needed (for vaginal itching/yeast infection sx)., Disp: 2 tablet, Rfl: 0   gabapentin (NEURONTIN) 300 MG capsule, Take 300 mg by mouth 2 (two) times daily., Disp: , Rfl:    gabapentin (NEURONTIN) 600 MG tablet, Take 600 mg by mouth at bedtime., Disp: , Rfl:    GLYXAMBI 25-5 MG TABS, Take 1 tablet by mouth daily., Disp: , Rfl:    HUMULIN R U-500 KWIKPEN 500 UNIT/ML kwikpen, Inject 70 Units into the skin 3 (three) times daily with meals., Disp: , Rfl:    insulin aspart (NOVOLOG FLEXPEN) 100 UNIT/ML FlexPen, Inject 3 Units subcutaneously. Sliding scale., Disp: , Rfl:    lamoTRIgine (LAMICTAL) 200 MG tablet, Take  200 mg by mouth 2 (two) times daily. , Disp: , Rfl:    lisinopril (ZESTRIL) 2.5 MG tablet, Take 1 tablet (2.5 mg total) by mouth daily., Disp: 90 tablet, Rfl: 0   methocarbamol (ROBAXIN) 750 MG tablet, methocarbamol 750 mg tablet  Take 1 tablet every 8 hours by oral route., Disp: , Rfl:    metoprolol tartrate (LOPRESSOR) 25 MG tablet, Take 1 tablet (25 mg total) by mouth 2 (two) times daily., Disp: 180 tablet, Rfl: 1   nitrofurantoin, macrocrystal-monohydrate, (MACROBID) 100 MG capsule, Take 1 capsule (100 mg total) by mouth 2 (two) times daily., Disp: 14 capsule, Rfl: 0   nitroGLYCERIN (NITROSTAT) 0.4 MG SL tablet, Place 1 tablet (0.4 mg total) under the tongue every 5 (five) minutes as needed for chest pain., Disp: 25 tablet, Rfl: 0   OneTouch Delica Lancets 09F MISC, by Does not apply route., Disp: , Rfl:    promethazine (PHENERGAN) 12.5 MG tablet, TAKE 1 TABLET BY MOUTH EVERY 6 HOURS AS NEEDED FOR NAUSEA OR VOMITING, Disp: 30 tablet, Rfl: 1   QUEtiapine (SEROQUEL) 200 MG tablet, Take 200 mg by mouth at bedtime. Along with 322m dose for a total of 502m Disp: , Rfl:    QUEtiapine (SEROQUEL) 300 MG tablet, Take 300 mg by mouth at bedtime. Along with 20011mose for a total of 500m95misp: , Rfl:    SYNTHROID 100 MCG tablet, Take 100 mcg by mouth daily., Disp: , Rfl:    TRULICITY 1.5 MG/0GH/8.2XHN, Inject 1.5 mg into the skin once a week. (Wednesdays), Disp: , Rfl:    SUMAtriptan (IMITREX) 100 MG tablet, Take 1 tablet (100 mg total) by mouth once for 1 dose. May repeat in 2 hours if headache persists or recurs., Disp: 10 tablet, Rfl: 0  Allergies  Allergen Reactions   Mirtazapine Anaphylaxis    REACTION: closes throat   Morphine And Related     anaphylaxis   Sulfamethoxazole-Trimethoprim Rash    REACTION: Whelps all over  Vicodin Hp [Hydrocodone-Acetaminophen]    Amoxicillin Er Nausea And Vomiting    Pt doesn't remember problem with amoxicillin -  SE Has patient had a PCN reaction causing  immediate rash, facial/tongue/throat swelling, SOB or lightheadedness with hypotension: No Has patient had a PCN reaction causing severe rash involving mucus membranes or skin necrosis: No Has patient had a PCN reaction that required hospitalization: No Has patient had a PCN reaction occurring within the last 10 years: Yes If all of the above answers are "NO", then may proceed with Cephalosporin use.   Codeine Itching    REACTION: itch, rash   Hydrocodone-Acetaminophen Rash    REACTION: rash   Keflex [Cephalexin] Rash    Pt does not remember having a reaction or rash, no airway involvement   Prasterone Nausea And Vomiting    I personally reviewed active problem list, medication list, allergies, family history, social history, health maintenance with the patient/caregiver today.   ROS  Constitutional: Negative for fever or weight change.  Respiratory: Negative for cough and shortness of breath.   Cardiovascular: Negative for chest pain or palpitations.  Gastrointestinal: Negative for abdominal pain, no bowel changes.  Musculoskeletal: Negative for gait problem or joint swelling.  Skin: Negative for rash.  Neurological: Negative for dizziness or headache.  No other specific complaints in a complete review of systems (except as listed in HPI above).   Objective  Vitals:   03/28/21 1456  BP: 130/70  Pulse: 82  Resp: 16  Temp: 98.1 F (36.7 C)  TempSrc: Oral  SpO2: 96%  Weight: 190 lb (86.2 kg)  Height: 4' 11"  (1.499 m)    Body mass index is 38.38 kg/m.  Physical Exam  Constitutional: Patient appears well-developed and well-nourished. Obese  No distress.  HEENT: head atraumatic, normocephalic, pupils equal and reactive to light, neck supple Cardiovascular: Normal rate, regular rhythm and normal heart sounds.  No murmur heard. No BLE edema. Pulmonary/Chest: Effort normal and breath sounds normal. No respiratory distress. Abdominal: Soft.  There is no  tenderness. Psychiatric: Patient has a normal mood and affect. behavior is normal. Judgment and thought content normal.   Recent Results (from the past 2160 hour(s))  POCT Urinalysis Dipstick     Status: Abnormal   Collection Time: 01/04/21 12:26 PM  Result Value Ref Range   Color, UA Yellow    Clarity, UA Clear    Glucose, UA Positive (A) Negative   Bilirubin, UA Negative    Ketones, UA Negative    Spec Grav, UA 1.020 1.010 - 1.025   Blood, UA Negative    pH, UA 5.0 5.0 - 8.0   Protein, UA Negative Negative   Urobilinogen, UA 0.2 0.2 or 1.0 E.U./dL   Nitrite, UA Negative    Leukocytes, UA Negative Negative   Appearance     Odor    Urine Culture     Status: None   Collection Time: 01/04/21 12:39 PM   Specimen: Urine  Result Value Ref Range   MICRO NUMBER: [id number]    SPECIMEN QUALITY: Adequate    Sample Source URINE    STATUS: FINAL    ISOLATE 1:      Mixed genital flora isolated. These superficial bacteria are not indicative of a urinary tract infection. No further organism identification is warranted on this specimen. If clinically indicated, recollect clean-catch, mid-stream urine and transfer  immediately to Urine Culture Transport Tube.   Urinalysis, Complete w Microscopic Urine, Random     Status: Abnormal  Collection Time: 01/25/21 10:53 AM  Result Value Ref Range   Color, Urine YELLOW (A) YELLOW   APPearance CLEAR (A) CLEAR   Specific Gravity, Urine 1.027 1.005 - 1.030   pH 5.0 5.0 - 8.0   Glucose, UA >=500 (A) NEGATIVE mg/dL   Hgb urine dipstick NEGATIVE NEGATIVE   Bilirubin Urine NEGATIVE NEGATIVE   Ketones, ur NEGATIVE NEGATIVE mg/dL   Protein, ur NEGATIVE NEGATIVE mg/dL   Nitrite NEGATIVE NEGATIVE   Leukocytes,Ua NEGATIVE NEGATIVE   RBC / HPF 0-5 0 - 5 RBC/hpf   WBC, UA 0-5 0 - 5 WBC/hpf   Bacteria, UA RARE (A) NONE SEEN   Squamous Epithelial / LPF 0-5 0 - 5   Mucus PRESENT     Comment: Performed at [facility], [address]  [place], [place] [address]  Basic metabolic panel     Status: Abnormal   Collection Time: 01/25/21 12:41 PM  Result Value Ref Range   Sodium 140 135 - 145 mmol/L   Potassium 4.3 3.5 - 5.1 mmol/L   Chloride 101 98 - 111 mmol/L   CO2 27 22 - 32 mmol/L   Glucose, Bld 207 (H) 70 - 99 mg/dL    Comment: Glucose reference range applies only to samples taken after fasting for at least 8 hours.   BUN 27 (H) 6 - 20 mg/dL   Creatinine, Ser 1.50 (H) 0.44 - 1.00 mg/dL   Calcium 13.2 (HH) 8.9 - 10.3 mg/dL    Comment: CRITICAL RESULT CALLED TO, READ BACK BY AND VERIFIED WITH LINDA MCLAMB 1324 01/25/2021 DLB    GFR, Estimated 40 (L) >60 mL/min    Comment: (NOTE) Calculated using the CKD-EPI Creatinine Equation (2021)    Anion gap 12 5 - 15    Comment: Performed at [facility], [address] [place], [place] [address]  CBC with Differential     Status: None   Collection Time: 01/25/21 12:41 PM  Result Value Ref Range   WBC 9.0 4.0 - 10.5 K/uL   RBC 4.37 3.87 - 5.11 MIL/uL   Hemoglobin 12.9 12.0 - 15.0 g/dL   HCT 40.5 36.0 - 46.0 %   MCV 92.7 80.0 - 100.0 fL   MCH 29.5 26.0 - 34.0 pg   MCHC 31.9 30.0 - 36.0 g/dL   RDW 13.1 11.5 - 15.5 %   Platelets 277 150 - 400 K/uL   nRBC 0.0 0.0 - 0.2 %   Neutrophils Relative % 59 %   Neutro Abs 5.3 1.7 - 7.7 K/uL   Lymphocytes Relative 32 %   Lymphs Abs 2.8 0.7 - 4.0 K/uL   Monocytes Relative 6 %   Monocytes Absolute 0.5 0.1 - 1.0 K/uL   Eosinophils Relative 2 %   Eosinophils Absolute 0.2 0.0 - 0.5 K/uL   Basophils Relative 0 %   Basophils Absolute 0.0 0.0 - 0.1 K/uL   Immature Granulocytes 1 %   Abs Immature Granulocytes 0.05 0.00 - 0.07 K/uL    Comment: Performed at [facility], [address] [place], [place] [address]  Hemoglobin A1c     Status: None   Collection Time: 02/03/21 12:00 AM  Result Value Ref Range   Hemoglobin A1C 7.7   Novel Coronavirus, NAA ([facility])     Status: Abnormal   Collection Time: 02/14/21 12:00 AM    Specimen: Nasopharyngeal(NP) swabs in vial transport medium   Nasopharynge  Result Value Ref Range   SARS-CoV-2, NAA Detected (A) Not Detected  Comment: Patients who have a positive COVID-19 test result may now have treatment options. Treatment options are available for patients with mild to moderate symptoms and for hospitalized patients. Visit our website at [email or web address] for resources and information. This nucleic acid amplification test was developed and its performance characteristics determined by [employer]. Nucleic acid amplification tests include RT-PCR and TMA. This test has not been FDA cleared or approved. This test has been authorized by FDA under an Emergency Use Authorization (EUA). This test is only authorized for the duration of time the declaration that circumstances exist justifying the authorization of the emergency use of in vitro diagnostic tests for detection of SARS-CoV-2 virus and/or diagnosis of COVID-19 infection under section 564(b)(1) of the Act, 21 U.S.C. 147WGN-5(A) (1), unless the authorization is terminated or revoked sooner. When diagnostic testing is negativ e, the possibility of a false negative result should be considered in the context of a patient's recent exposures and the presence of clinical signs and symptoms consistent with COVID-19. An individual without symptoms of COVID-19 and who is not shedding SARS-CoV-2 virus would expect to have a negative (not detected) result in this assay.   SARS-COV-2, NAA 2 DAY TAT     Status: None   Collection Time: 02/14/21 12:00 AM   Nasopharynge  Result Value Ref Range   SARS-CoV-2, NAA 2 DAY TAT Performed   Specimen status report     Status: None   Collection Time: 02/14/21 12:00 AM  Result Value Ref Range   specimen status report Comment     Comment: Please note Please note The date and/or time of collection was not indicated on the requisition as required by state  and federal law.  The date of receipt of the specimen was used as the collection date if not supplied.   COMPLETE METABOLIC PANEL WITH GFR     Status: Abnormal   Collection Time: 02/14/21  3:13 PM  Result Value Ref Range   Glucose, Bld 261 (H) 65 - 99 mg/dL    Comment: .            Fasting reference interval . For someone without known diabetes, a glucose value >125 mg/dL indicates that they may have diabetes and this should be confirmed with a follow-up test. .    BUN 24 7 - 25 mg/dL   Creat 1.37 (H) 0.50 - 1.05 mg/dL    Comment: For patients >73 years of age, the reference limit for Creatinine is approximately 13% higher for people identified as African-American. .    GFR, Est Non African American 43 (L) > OR = 60 mL/min/1.36m   GFR, Est African American 49 (L) > OR = 60 mL/min/1.759m  BUN/Creatinine Ratio 18 6 - 22 (calc)   Sodium 140 135 - 146 mmol/L   Potassium 4.4 3.5 - 5.3 mmol/L   Chloride 106 98 - 110 mmol/L   CO2 23 20 - 32 mmol/L   Calcium 9.5 8.6 - 10.4 mg/dL   Total Protein 6.1 6.1 - 8.1 g/dL   Albumin 3.9 3.6 - 5.1 g/dL   Globulin 2.2 1.9 - 3.7 g/dL (calc)   AG Ratio 1.8 1.0 - 2.5 (calc)   Total Bilirubin 0.2 0.2 - 1.2 mg/dL   Alkaline phosphatase (APISO) 43 37 - 153 U/L   AST 26 10 - 35 U/L   ALT 20 6 - 29 U/L  POCT urinalysis dipstick     Status: Abnormal   Collection Time: 03/10/21  3:15 PM  Result Value Ref Range   Color, UA yellow    Clarity, UA cloudy    Glucose, UA Positive (A) Negative   Bilirubin, UA negative    Ketones, UA negative    Spec Grav, UA 1.020 1.010 - 1.025   Blood, UA small    pH, UA 5.0 5.0 - 8.0   Protein, UA Positive (A) Negative   Urobilinogen, UA 0.2 0.2 or 1.0 E.U./dL   Nitrite, UA negative    Leukocytes, UA Trace (A) Negative   Appearance clear    Odor none   Urine Culture     Status: None   Collection Time: 03/10/21  3:29 PM   Specimen: Urine  Result Value Ref Range   MICRO NUMBER: [id number]    SPECIMEN  QUALITY: Adequate    Sample Source URINE    STATUS: FINAL    Result:      Mixed genital flora isolated. These superficial bacteria are not indicative of a urinary tract infection. No further organism identification is warranted on this specimen. If clinically indicated, recollect clean-catch, mid-stream urine and transfer  immediately to Urine Culture Transport Tube.     Diabetic Foot Exam: Diabetic Foot Exam - Simple   No data filed    ***  PHQ2/9: Depression screen [facility] 2/9 03/28/2021 03/10/2021 02/14/2021 02/01/2021 01/27/2021  Decreased Interest 2 2 0 1 0  Down, Depressed, Hopeless 1 0 0 1 0  PHQ - 2 Score 3 2 0 2 0  Altered sleeping 0 0 0 0 -  Tired, decreased energy 2 0 0 2 -  Change in appetite 0 0 0 3 -  Feeling bad or failure about yourself  0 0 0 0 -  Trouble concentrating 0 0 0 1 -  Moving slowly or fidgety/restless 0 0 0 1 -  Suicidal thoughts 0 0 0 0 -  PHQ-9 Score 5 2 0 9 -  Difficult doing work/chores - Not difficult at all Not difficult at all Somewhat difficult -  Some recent data might be hidden    phq 9 is positive   Fall Risk: Fall Risk  03/28/2021 03/10/2021 02/14/2021 02/01/2021 01/27/2021  Falls in the past year? 1 0 0 0 0  Comment - - - - -  Number falls in past yr: 0 0 0 0 0  Comment - - - - -  Injury with Fall? 0 0 0 0 -  Comment - - - - -  Risk for fall due to : - - - No Fall Risks Medication side effect  Follow up - Falls evaluation completed - Falls prevention discussed Falls prevention discussed     Functional Status Survey: Is the patient deaf or have difficulty hearing?: No Does the patient have difficulty seeing, even when wearing glasses/contacts?: Yes Does the patient have difficulty concentrating, remembering, or making decisions?: Yes Does the patient have difficulty walking or climbing stairs?: Yes Does the patient have difficulty dressing or bathing?: No Does the patient have difficulty doing errands alone such as visiting a doctor's  office or shopping?: No    Assessment & Plan  1. Atherosclerosis of aorta ([place])   2. Chronic obstructive pulmonary disease, unspecified COPD type ([place])   3. Diabetic polyneuropathy associated with type 2 diabetes mellitus ([place])  - Ambulatory referral to Endocrinology  4. Restless leg syndrome   5. Migraine without aura and without status migrainosus, not intractable  - SUMAtriptan (IMITREX) 100 MG tablet; Take 1 tablet (100 mg total) by mouth once  for 1 dose. May repeat in 2 hours if headache persists or recurs.  Dispense: 10 tablet; Refill: 0  6. Morbid obesity ([place])  BMI above 35 with co-morbidities, such as DM, OSA, GERD  7. OSA (obstructive sleep apnea)   8. Stage 3 chronic kidney disease, unspecified whether stage 3a or 3b CKD ([place])   9. Chronic congestive heart failure, unspecified heart failure type ([place])   10. Schizoaffective disorder, bipolar type ([place])   11. Adult hypothyroidism  - Ambulatory referral to Endocrinology

## 2021-03-28 NOTE — Progress Notes (Signed)
Name: [name]   MRN: [id number]    DOB: 02-04-1966   Date:03/28/2021       Progress Note  Subjective  Chief Complaint  Follow Up  HPI  RLS: she states she has been on Gabapentin  for a long time, she states dose was increased in December 2020 She has an urge to move it at night, both feet feels on fire , sometimes also jumps during the day but mostly at night.  It causes difficulty for her to fall asleep. He magnesium was low in the past and advised to take otc supplementation also    Hypersomnolence: she states positive for OSA however unable to find a mask that fits her face and is only on nocturnal oxygen she states oxygen has helped    DMII: seeing Dr. Ronnald Collum, A1C down from 10.6 % in January 2022 to  8.7 % a month later, last value was 7.7 % on 02/03/2021   She has episodes of  polyphagia, polydipsia or polyuria. She is complaint with her medications, today she use insulin at noon and forgot to eat, when she arrived to our office her glucose was down to 47, we gave her chocolate and a glass of orange juice. Explained importance of eating shortly after an insulin dose.   She states since last visit she has changed her diet, she has switched to water and eating smaller amounts. She has obesity and neuropathy. She was reminded to have yearly eye exam.  Dr. Ronnald Collum  is giving her  to 70 unit TID and sliding scare with all regular insulin. Discussed her medications, she is willing to have a second opinion, she is willing to go to [place].    Hypothyroidism: managed by Dr. Ronnald Collum, she denies dysphagia, no dry skin, no change in bowel movements    Schizoaffective disorder/Bipolar : she is no longer seeing Dr. Nicolasa Ducking under the care of [facility]  , she is compliant with medications and visits. Unable to go to [facility] due to her insurance   CAD: echo normal EF, she is on medical management, seeing Dr. Rockey Situ. Denies chest pain , she is on medical management   Vitamin B12 and  Vtiamin D deficiency: taking supplementation  Morbid obesity: BMI above 35 with co-morbidities, she lost a lot of weight with COVID but gaining it back.   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, they recommended follow up yearly, advised her to go back   CKI: she sees Dr. Holley Raring her last GFR last month was  a little lower at 27  she is on ARB and glucose is getting better controlled, good urine output and no pruritis   Atherosclerosis of aorta: continue medications, on statin therapy and zetia, LDL is at goal at 50   Migraine headaches: episodes have been more often, a few times a week. She describes headaches as aching or tightness, has photophobia and phonophobia , she needs refill of Imitrex  Patient Active Problem List   Diagnosis Date Noted   Severe sepsis with septic shock ([place]) 11/30/2020   Syncope 11/29/2020   Chronic congestive heart failure ([place]) 11/04/2020   Iron deficiency anemia due to chronic blood loss    Polyp of ascending colon    Chronic gastric ulcer with hemorrhage    Vitamin B12 deficiency 08/08/2018   Patient is full code 10/30/2017   Coronary artery disease 10/09/2017   Plantar fascial fibromatosis 10/09/2017   Tendinitis of wrist 10/09/2017   Schizoaffective disorder, bipolar type ([place])  09/28/2017   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 09/28/2016   Breast cancer screening 09/28/2016   Acute lower UTI 03/14/2016   Adenomatous colon polyp 10/15/2015   Bipolar affective disorder ([place]) 10/15/2015   CN (constipation) 10/15/2015   Drug-induced hepatic toxicity 10/15/2015   HLD (hyperlipidemia) 10/15/2015   Hypoxemia 10/15/2015   NASH (nonalcoholic steatohepatitis) 10/15/2015   Morbid obesity ([place]) 10/15/2015   Restless leg syndrome 10/15/2015   Type 2 diabetes, uncontrolled, with neuropathy ([place]) 10/15/2015   Elevated alkaline phosphatase level 09/22/2015   Low back pain with left-sided sciatica 08/18/2015   Apnea, sleep 05/05/2015   Heart valve disease  05/05/2015   Chronic kidney disease, stage III (moderate) ([place]) 03/15/2015   Migraine 03/12/2015   COPD (chronic obstructive pulmonary disease) ([place]) 01/20/2015   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[place]) 01/20/2015   GERD (gastroesophageal reflux disease) 01/20/2015   Hypothyroid    Vitamin D deficiency    Urinary incontinence    Liver lesion    Hypomagnesemia    Obstructive sleep apnea 10/20/2013   PERIPHERAL NEUROPATHY 03/04/2009   INTERSTITIAL CYSTITIS 07/04/2007   Allergic rhinitis 07/04/2007    Past Surgical History:  Procedure Laterality Date   ABDOMINAL HYSTERECTOMY     Partial   bladder stim  2007   BLADDER SURGERY     x 2. tacking of bladder and the other was the stimulator placement   CHOLECYSTECTOMY     COLONOSCOPY WITH PROPOFOL N/A 03/04/2019   Procedure: COLONOSCOPY WITH PROPOFOL;  Surgeon: [name], MD;  Location[facility] ENDOSCOPY;  Service: Endoscopy;  Laterality: N/A;   ESOPHAGOGASTRODUODENOSCOPY (EGD) WITH PROPOFOL N/A 03/04/2019   Procedure: ESOPHAGOGASTRODUODENOSCOPY (EGD) WITH PROPOFOL;  Surgeon: [name], MD;  Location: [facility] ENDOSCOPY;  Service: Endoscopy;  Laterality: N/A;   INCISION AND DRAINAGE ABSCESS Right 04/16/2019   Procedure: INCISION AND DRAINAGE ABSCESS;  Surgeon: Benjamine Sprague, DO;  Location: [facility] ORS;  Service: General;  Laterality: Right;   INTERSTIM-GENERATOR CHANGE N/A 12/31/2017   Procedure: INTERSTIM-GENERATOR CHANGE;  Surgeon: Bjorn Loser, MD;  Location: [facility] ORS;  Service: Urology;  Laterality: N/A;   LIVER BIOPSY  March 2016   Negative   LUNG BIOPSY  2015   small spot on lung   TUBAL LIGATION     UPPER GI ENDOSCOPY     WRIST SURGERY Right 11/02/2019    Family History  Problem Relation Age of Onset   Diabetes Mother    Heart disease Mother    Hyperlipidemia Mother    Hypertension Mother    Kidney disease Mother    Mental illness Mother    Hypothyroidism Mother    Stroke Mother    Osteoporosis Mother    Glaucoma Mother     Congestive Heart Failure Mother    Hypertension Father    Asthma Daughter    Cancer Daughter        throat   Bipolar disorder Daughter    Heart disease Maternal Grandfather    Rheum arthritis Maternal Grandfather    Cancer Maternal Grandfather        liver   Kidney disease Maternal Grandfather    Cancer Paternal Grandmother        lung   Kidney disease Brother    Breast cancer Maternal Grandmother 71   Bipolar disorder Daughter    Hypertension Daughter    Restless legs syndrome Daughter     Social History   Tobacco Use   Smoking status: Never   Smokeless tobacco: Never  Substance  Use Topics   Alcohol use: No     Current Outpatient Medications:    atorvastatin (LIPITOR) 80 MG tablet, Take 80 mg by mouth at bedtime. , Disp: , Rfl:    busPIRone (BUSPAR) 30 MG tablet, Take 30 mg by mouth 2 (two) times daily., Disp: , Rfl:    citalopram (CELEXA) 20 MG tablet, Take 20 mg by mouth daily., Disp: , Rfl:    dexlansoprazole (DEXILANT) 60 MG capsule, Take 1 capsule (60 mg total) by mouth daily., Disp: 30 capsule, Rfl: 6   ezetimibe (ZETIA) 10 MG tablet, Take 10 mg by mouth at bedtime., Disp: , Rfl:    fenofibrate micronized (LOFIBRA) 134 MG capsule, Take 134 mg by mouth at bedtime., Disp: , Rfl:    gabapentin (NEURONTIN) 300 MG capsule, Take 300 mg by mouth 2 (two) times daily., Disp: , Rfl:    gabapentin (NEURONTIN) 600 MG tablet, Take 600 mg by mouth at bedtime., Disp: , Rfl:    GLYXAMBI 25-5 MG TABS, Take 1 tablet by mouth daily., Disp: , Rfl:    HUMULIN R U-500 KWIKPEN 500 UNIT/ML kwikpen, Inject 70 Units into the skin 3 (three) times daily with meals., Disp: , Rfl:    insulin aspart (NOVOLOG FLEXPEN) 100 UNIT/ML FlexPen, Inject 3 Units subcutaneously. Sliding scale., Disp: , Rfl:    lamoTRIgine (LAMICTAL) 200 MG tablet, Take 200 mg by mouth 2 (two) times daily. , Disp: , Rfl:    lisinopril (ZESTRIL) 2.5 MG tablet, Take 1 tablet (2.5 mg total) by mouth daily., Disp: 90 tablet, Rfl:  0   methocarbamol (ROBAXIN) 750 MG tablet, methocarbamol 750 mg tablet  Take 1 tablet every 8 hours by oral route., Disp: , Rfl:    metoprolol tartrate (LOPRESSOR) 25 MG tablet, Take 1 tablet (25 mg total) by mouth 2 (two) times daily., Disp: 180 tablet, Rfl: 1   nitroGLYCERIN (NITROSTAT) 0.4 MG SL tablet, Place 1 tablet (0.4 mg total) under the tongue every 5 (five) minutes as needed for chest pain., Disp: 25 tablet, Rfl: 0   OneTouch Delica Lancets 16X MISC, by Does not apply route., Disp: , Rfl:    promethazine (PHENERGAN) 12.5 MG tablet, TAKE 1 TABLET BY MOUTH EVERY 6 HOURS AS NEEDED FOR NAUSEA OR VOMITING, Disp: 30 tablet, Rfl: 1   QUEtiapine (SEROQUEL) 200 MG tablet, Take 200 mg by mouth at bedtime. Along with 350m dose for a total of 5017m Disp: , Rfl:    QUEtiapine (SEROQUEL) 300 MG tablet, Take 300 mg by mouth at bedtime. Along with 20078mose for a total of 500m84misp: , Rfl:    SYNTHROID 100 MCG tablet, Take 100 mcg by mouth daily., Disp: , Rfl:    TRULICITY 1.5 MG/0WR/6.0AVN, Inject 1.5 mg into the skin once a week. (Wednesdays), Disp: , Rfl:    SUMAtriptan (IMITREX) 100 MG tablet, Take 1 tablet (100 mg total) by mouth once for 1 dose. May repeat in 2 hours if headache persists or recurs., Disp: 10 tablet, Rfl: 0  Allergies  Allergen Reactions   Mirtazapine Anaphylaxis    REACTION: closes throat   Morphine And Related     anaphylaxis   Sulfamethoxazole-Trimethoprim Rash    REACTION: Whelps all over   Vicodin Hp [Hydrocodone-Acetaminophen]    Amoxicillin Er Nausea And Vomiting    Pt doesn't remember problem with amoxicillin -  SE Has patient had a PCN reaction causing immediate rash, facial/tongue/throat swelling, SOB or lightheadedness with hypotension: No Has patient had a PCN  reaction causing severe rash involving mucus membranes or skin necrosis: No Has patient had a PCN reaction that required hospitalization: No Has patient had a PCN reaction occurring within the last 10  years: Yes If all of the above answers are "NO", then may proceed with Cephalosporin use.   Codeine Itching    REACTION: itch, rash   Hydrocodone-Acetaminophen Rash    REACTION: rash   Keflex [Cephalexin] Rash    Pt does not remember having a reaction or rash, no airway involvement   Prasterone Nausea And Vomiting    I personally reviewed active problem list, medication list, allergies, family history, social history, health maintenance with the patient/caregiver today.   ROS  Constitutional: Negative for fever or weight change.  Respiratory: Negative for cough and shortness of breath.   Cardiovascular: Negative for chest pain or palpitations.  Gastrointestinal: Negative for abdominal pain, no bowel changes.  Musculoskeletal: Negative for gait problem or joint swelling.  Skin: Negative for rash.  Neurological: Negative for dizziness or headache.  No other specific complaints in a complete review of systems (except as listed in HPI above).   Objective  Vitals:   03/28/21 1456  BP: 130/70  Pulse: 82  Resp: 16  Temp: 98.1 F (36.7 C)  TempSrc: Oral  SpO2: 96%  Weight: 190 lb (86.2 kg)  Height: 4' 11"  (1.499 m)    Body mass index is 38.38 kg/m.  Physical Exam  Constitutional: Patient appears well-developed and well-nourished. Obese  No distress.  HEENT: head atraumatic, normocephalic, pupils equal and reactive to light, neck supple Cardiovascular: Normal rate, regular rhythm and normal heart sounds.  No murmur heard. No BLE edema. Pulmonary/Chest: Effort normal and breath sounds normal. No respiratory distress. Abdominal: Soft.  There is no tenderness. Psychiatric: Patient has a normal mood and affect. behavior is normal. Judgment and thought content normal.   Recent Results (from the past 2160 hour(s))  POCT Urinalysis Dipstick     Status: Abnormal   Collection Time: 01/04/21 12:26 PM  Result Value Ref Range   Color, UA Yellow    Clarity, UA Clear    Glucose, UA  Positive (A) Negative   Bilirubin, UA Negative    Ketones, UA Negative    Spec Grav, UA 1.020 1.010 - 1.025   Blood, UA Negative    pH, UA 5.0 5.0 - 8.0   Protein, UA Negative Negative   Urobilinogen, UA 0.2 0.2 or 1.0 E.U./dL   Nitrite, UA Negative    Leukocytes, UA Negative Negative   Appearance     Odor    Urine Culture     Status: None   Collection Time: 01/04/21 12:39 PM   Specimen: Urine  Result Value Ref Range   MICRO NUMBER: [id number]    SPECIMEN QUALITY: Adequate    Sample Source URINE    STATUS: FINAL    ISOLATE 1:      Mixed genital flora isolated. These superficial bacteria are not indicative of a urinary tract infection. No further organism identification is warranted on this specimen. If clinically indicated, recollect clean-catch, mid-stream urine and transfer  immediately to Urine Culture Transport Tube.   Urinalysis, Complete w Microscopic Urine, Random     Status: Abnormal   Collection Time: 01/25/21 10:53 AM  Result Value Ref Range   Color, Urine YELLOW (A) YELLOW   APPearance CLEAR (A) CLEAR   Specific Gravity, Urine 1.027 1.005 - 1.030   pH 5.0 5.0 - 8.0   Glucose, UA >=500 (A)  NEGATIVE mg/dL   Hgb urine dipstick NEGATIVE NEGATIVE   Bilirubin Urine NEGATIVE NEGATIVE   Ketones, ur NEGATIVE NEGATIVE mg/dL   Protein, ur NEGATIVE NEGATIVE mg/dL   Nitrite NEGATIVE NEGATIVE   Leukocytes,Ua NEGATIVE NEGATIVE   RBC / HPF 0-5 0 - 5 RBC/hpf   WBC, UA 0-5 0 - 5 WBC/hpf   Bacteria, UA RARE (A) NONE SEEN   Squamous Epithelial / LPF 0-5 0 - 5   Mucus PRESENT     Comment: Performed at [facility], [address], [place], [place] [address]  Basic metabolic panel     Status: Abnormal   Collection Time: 01/25/21 12:41 PM  Result Value Ref Range   Sodium 140 135 - 145 mmol/L   Potassium 4.3 3.5 - 5.1 mmol/L   Chloride 101 98 - 111 mmol/L   CO2 27 22 - 32 mmol/L   Glucose, Bld 207 (H) 70 - 99 mg/dL    Comment: Glucose reference range applies only to  samples taken after fasting for at least 8 hours.   BUN 27 (H) 6 - 20 mg/dL   Creatinine, Ser 1.50 (H) 0.44 - 1.00 mg/dL   Calcium 13.2 (HH) 8.9 - 10.3 mg/dL    Comment: CRITICAL RESULT CALLED TO, READ BACK BY AND VERIFIED WITH LINDA MCLAMB 1324 01/25/2021 DLB    GFR, Estimated 40 (L) >60 mL/min    Comment: (NOTE) Calculated using the CKD-EPI Creatinine Equation (2021)    Anion gap 12 5 - 15    Comment: Performed at [facility], [address] [place], [place] [address]  CBC with Differential     Status: None   Collection Time: 01/25/21 12:41 PM  Result Value Ref Range   WBC 9.0 4.0 - 10.5 K/uL   RBC 4.37 3.87 - 5.11 MIL/uL   Hemoglobin 12.9 12.0 - 15.0 g/dL   HCT 40.5 36.0 - 46.0 %   MCV 92.7 80.0 - 100.0 fL   MCH 29.5 26.0 - 34.0 pg   MCHC 31.9 30.0 - 36.0 g/dL   RDW 13.1 11.5 - 15.5 %   Platelets 277 150 - 400 K/uL   nRBC 0.0 0.0 - 0.2 %   Neutrophils Relative % 59 %   Neutro Abs 5.3 1.7 - 7.7 K/uL   Lymphocytes Relative 32 %   Lymphs Abs 2.8 0.7 - 4.0 K/uL   Monocytes Relative 6 %   Monocytes Absolute 0.5 0.1 - 1.0 K/uL   Eosinophils Relative 2 %   Eosinophils Absolute 0.2 0.0 - 0.5 K/uL   Basophils Relative 0 %   Basophils Absolute 0.0 0.0 - 0.1 K/uL   Immature Granulocytes 1 %   Abs Immature Granulocytes 0.05 0.00 - 0.07 K/uL    Comment: Performed at [facility], [address] [place], [place] [address]  Hemoglobin A1c     Status: None   Collection Time: 02/03/21 12:00 AM  Result Value Ref Range   Hemoglobin A1C 7.7   Novel Coronavirus, NAA ([facility])     Status: Abnormal   Collection Time: 02/14/21 12:00 AM   Specimen: Nasopharyngeal(NP) swabs in vial transport medium   Nasopharynge  Result Value Ref Range   SARS-CoV-2, NAA Detected (A) Not Detected    Comment: Patients who have a positive COVID-19 test result may now have treatment options. Treatment options are available for patients with mild to moderate symptoms and for hospitalized  patients. Visit our website at [email or web address] for resources and information. This nucleic acid amplification  test was developed and its performance characteristics determined by [employer]. Nucleic acid amplification tests include RT-PCR and TMA. This test has not been FDA cleared or approved. This test has been authorized by FDA under an Emergency Use Authorization (EUA). This test is only authorized for the duration of time the declaration that circumstances exist justifying the authorization of the emergency use of in vitro diagnostic tests for detection of SARS-CoV-2 virus and/or diagnosis of COVID-19 infection under section 564(b)(1) of the Act, 21 U.S.C. 854OEV-0(J) (1), unless the authorization is terminated or revoked sooner. When diagnostic testing is negativ e, the possibility of a false negative result should be considered in the context of a patient's recent exposures and the presence of clinical signs and symptoms consistent with COVID-19. An individual without symptoms of COVID-19 and who is not shedding SARS-CoV-2 virus would expect to have a negative (not detected) result in this assay.   SARS-COV-2, NAA 2 DAY TAT     Status: None   Collection Time: 02/14/21 12:00 AM   Nasopharynge  Result Value Ref Range   SARS-CoV-2, NAA 2 DAY TAT Performed   Specimen status report     Status: None   Collection Time: 02/14/21 12:00 AM  Result Value Ref Range   specimen status report Comment     Comment: Please note Please note The date and/or time of collection was not indicated on the requisition as required by state and federal law.  The date of receipt of the specimen was used as the collection date if not supplied.   COMPLETE METABOLIC PANEL WITH GFR     Status: Abnormal   Collection Time: 02/14/21  3:13 PM  Result Value Ref Range   Glucose, Bld 261 (H) 65 - 99 mg/dL    Comment: .            Fasting reference interval . For someone without  known diabetes, a glucose value >125 mg/dL indicates that they may have diabetes and this should be confirmed with a follow-up test. .    BUN 24 7 - 25 mg/dL   Creat 1.37 (H) 0.50 - 1.05 mg/dL    Comment: For patients >46 years of age, the reference limit for Creatinine is approximately 13% higher for people identified as African-American. .    GFR, Est Non African American 43 (L) > OR = 60 mL/min/1.23m   GFR, Est African American 49 (L) > OR = 60 mL/min/1.732m  BUN/Creatinine Ratio 18 6 - 22 (calc)   Sodium 140 135 - 146 mmol/L   Potassium 4.4 3.5 - 5.3 mmol/L   Chloride 106 98 - 110 mmol/L   CO2 23 20 - 32 mmol/L   Calcium 9.5 8.6 - 10.4 mg/dL   Total Protein 6.1 6.1 - 8.1 g/dL   Albumin 3.9 3.6 - 5.1 g/dL   Globulin 2.2 1.9 - 3.7 g/dL (calc)   AG Ratio 1.8 1.0 - 2.5 (calc)   Total Bilirubin 0.2 0.2 - 1.2 mg/dL   Alkaline phosphatase (APISO) 43 37 - 153 U/L   AST 26 10 - 35 U/L   ALT 20 6 - 29 U/L  POCT urinalysis dipstick     Status: Abnormal   Collection Time: 03/10/21  3:15 PM  Result Value Ref Range   Color, UA yellow    Clarity, UA cloudy    Glucose, UA Positive (A) Negative   Bilirubin, UA negative    Ketones, UA negative    Spec Grav, UA 1.020 1.010 -  1.025   Blood, UA small    pH, UA 5.0 5.0 - 8.0   Protein, UA Positive (A) Negative   Urobilinogen, UA 0.2 0.2 or 1.0 E.U./dL   Nitrite, UA negative    Leukocytes, UA Trace (A) Negative   Appearance clear    Odor none   Urine Culture     Status: None   Collection Time: 03/10/21  3:29 PM   Specimen: Urine  Result Value Ref Range   MICRO NUMBER: [id number]    SPECIMEN QUALITY: Adequate    Sample Source URINE    STATUS: FINAL    Result:      Mixed genital flora isolated. These superficial bacteria are not indicative of a urinary tract infection. No further organism identification is warranted on this specimen. If clinically indicated, recollect clean-catch, mid-stream urine and transfer  immediately to Urine  Culture Transport Tube.      PHQ2/9: Depression screen [facility] 2/9 03/28/2021 03/10/2021 02/14/2021 02/01/2021 01/27/2021  Decreased Interest 2 2 0 1 0  Down, Depressed, Hopeless 1 0 0 1 0  PHQ - 2 Score 3 2 0 2 0  Altered sleeping 0 0 0 0 -  Tired, decreased energy 2 0 0 2 -  Change in appetite 0 0 0 3 -  Feeling bad or failure about yourself  0 0 0 0 -  Trouble concentrating 0 0 0 1 -  Moving slowly or fidgety/restless 0 0 0 1 -  Suicidal thoughts 0 0 0 0 -  PHQ-9 Score 5 2 0 9 -  Difficult doing work/chores - Not difficult at all Not difficult at all Somewhat difficult -  Some recent data might be hidden    phq 9 is positive   Fall Risk: Fall Risk  03/28/2021 03/10/2021 02/14/2021 02/01/2021 01/27/2021  Falls in the past year? 1 0 0 0 0  Comment - - - - -  Number falls in past yr: 0 0 0 0 0  Comment - - - - -  Injury with Fall? 0 0 0 0 -  Comment - - - - -  Risk for fall due to : - - - No Fall Risks Medication side effect  Follow up - Falls evaluation completed - Falls prevention discussed Falls prevention discussed     Functional Status Survey: Is the patient deaf or have difficulty hearing?: No Does the patient have difficulty seeing, even when wearing glasses/contacts?: Yes Does the patient have difficulty concentrating, remembering, or making decisions?: Yes Does the patient have difficulty walking or climbing stairs?: Yes Does the patient have difficulty dressing or bathing?: No Does the patient have difficulty doing errands alone such as visiting a doctor's office or shopping?: No    Assessment & Plan  1. Atherosclerosis of aorta ([place])  On statin therapy   2. Selective deficiency IgG  ([place])  Seen by [facility] , last visit 2020   3. Diabetic polyneuropathy associated with type 2 diabetes mellitus ([place])  - Ambulatory referral to Endocrinology  4. Restless leg syndrome   5. Migraine without aura and without status migrainosus, not intractable  -  SUMAtriptan (IMITREX) 100 MG tablet; Take 1 tablet (100 mg total) by mouth once for 1 dose. May repeat in 2 hours if headache persists or recurs.  Dispense: 10 tablet; Refill: 0  6. Morbid obesity ([place])  BMI above 35 with co-morbidities, such as DM, OSA, GERD  7. OSA (obstructive sleep apnea)  Unable to tolerate CPAP , on oxygen  only  8. Stage 3 chronic kidney disease, 3b CKD ([place])  Needs to stay hydrated   9. Schizoaffective disorder, bipolar type ([place])  Keep follow up with psychiatrist   10. Adult hypothyroidism  - Ambulatory referral to Endocrinology

## 2021-04-07 ENCOUNTER — Telehealth: Payer: Self-pay | Admitting: Family Medicine

## 2021-04-07 ENCOUNTER — Other Ambulatory Visit: Payer: Self-pay | Admitting: Family Medicine

## 2021-04-07 DIAGNOSIS — B359 Dermatophytosis, unspecified: Secondary | ICD-10-CM

## 2021-04-07 MED ORDER — FLUCONAZOLE 150 MG PO TABS: 150.0000 mg | ORAL_TABLET | ORAL | 0 refills | Status: DC

## 2021-04-07 NOTE — Telephone Encounter (Signed)
Pt calling in and is requesting to have a medication to help with the yeast infection on her stomach. Pt was last seen on 03/10/21 for the same issue. It went away and came back according to pt. Please advise.      MED[facility] [address] [place], [place] - [place] [address]  Phone: [phone] Fax: [phone]  Hours: Not open 24 hours

## 2021-04-08 ENCOUNTER — Ambulatory Visit: Payer: Medicare Other | Admitting: Physician Assistant

## 2021-04-08 NOTE — Telephone Encounter (Signed)
Pt aware.

## 2021-04-11 DIAGNOSIS — F250 Schizoaffective disorder, bipolar type: Secondary | ICD-10-CM | POA: Diagnosis not present

## 2021-04-11 DIAGNOSIS — Z79899 Other long term (current) drug therapy: Secondary | ICD-10-CM | POA: Diagnosis not present

## 2021-04-11 DIAGNOSIS — Z1389 Encounter for screening for other disorder: Secondary | ICD-10-CM | POA: Diagnosis not present

## 2021-04-13 ENCOUNTER — Telehealth: Payer: Self-pay

## 2021-04-13 NOTE — Telephone Encounter (Signed)
  Care Management   Follow Up Note   04/13/2021 Name: JENEL GIERKE MRN: [id number] DOB: Jun 09, 1966   Primary Care Provider: Steele Sizer, MD Reason for referral : Chronic Care Management   An unsuccessful telephone outreach was attempted today. The patient was referred to the case management team for assistance with care management and care coordination.    Follow Up Plan:  A HIPAA compliant voice message was left today requesting a return call.    C[facility] Care Management [facility] [place] [phone]

## 2021-04-18 ENCOUNTER — Ambulatory Visit (INDEPENDENT_AMBULATORY_CARE_PROVIDER_SITE_OTHER): Payer: Medicare Other | Admitting: Family Medicine

## 2021-04-18 ENCOUNTER — Encounter: Payer: Self-pay | Admitting: Family Medicine

## 2021-04-18 ENCOUNTER — Other Ambulatory Visit: Payer: Self-pay

## 2021-04-18 DIAGNOSIS — R300 Dysuria: Secondary | ICD-10-CM

## 2021-04-18 LAB — POCT URINALYSIS DIPSTICK
Bilirubin, UA: NEGATIVE
Glucose, UA: POSITIVE — AB
Ketones, UA: NEGATIVE
Nitrite, UA: NEGATIVE
Protein, UA: POSITIVE — AB
Spec Grav, UA: 1.02 (ref 1.010–1.025)
Urobilinogen, UA: 0.2 E.U./dL
pH, UA: 6 (ref 5.0–8.0)

## 2021-04-18 MED ORDER — NITROFURANTOIN MONOHYD MACRO 100 MG PO CAPS: 100.0000 mg | ORAL_CAPSULE | Freq: Two times a day (BID) | ORAL | 0 refills | Status: AC

## 2021-04-18 NOTE — Patient Instructions (Signed)
It was great to see you!  Our plans for today:  - We sent an antibiotic to your pharmacy. If you are still having trouble after completing this, come back to see [place].  - We sent your urine for culture, we will let you know these results.  - Make sure to keep your blood sugar under control as this will put you at risk for developing UTIs and yeast infections.  Take care and seek immediate care sooner if you develop any concerns.   Dr. Ky Barban

## 2021-04-18 NOTE — Progress Notes (Signed)
Virtual Visit Note  I connected with [name] on 04/18/21 at  3:40 PM EDT by phone and verified that I am speaking with the correct person using two identifiers.  Location: Patient: home Provider: [facility]   I discussed the limitations of evaluation and management by telemedicine and the availability of in person appointments. The patient expressed understanding and agreed to proceed.  History of Present Illness:  URINARY SYMPTOMS 2 days Dysuria: yes Urinary frequency: yes Urgency: yes Small volume voids: yes Urinary incontinence: yes Foul odor: no Hematuria: no Abdominal pain: no Back pain: yes Suprapubic pain/pressure: yes Flank pain: yes Fever:  no Vomiting: yes Relief with cranberry juice:  hasn't tried Relief with pyridium:  hasn't tried Status: better/worse/stable Previous urinary tract infection: yes Recurrent urinary tract infection: no Sexual activity: No sexually active Vaginal discharge: no Treatments attempted: BC powder    Observations/Objective:  Entirety of visit conducted over the phone.  Speaks in full sentences, no respiratory distress.   Assessment and Plan:  UTI Symptoms consistent with UTI, UA with +leuks though without nitrites. Also with glucosuria, likely contributing. Will send macrobid based on last +culture result, RTC if no better after completing. Will send urine for culture and adjust as indicated.   I discussed the assessment and treatment plan with the patient. The patient was provided an opportunity to ask questions and all were answered. The patient agreed with the plan and demonstrated an understanding of the instructions.   The patient was advised to call back or seek an in-person evaluation if the symptoms worsen or if the condition fails to improve as anticipated.  I provided 7 minutes of non-face-to-face time during this encounter.   Myles Gip, DO

## 2021-04-20 ENCOUNTER — Ambulatory Visit: Payer: Self-pay | Admitting: *Deleted

## 2021-04-20 LAB — URINE CULTURE
MICRO NUMBER:: 12244099
Result:: NO GROWTH
SPECIMEN QUALITY:: ADEQUATE

## 2021-04-20 NOTE — Telephone Encounter (Signed)
Patient called and asked about the abdominal pain that she called in for. She says she already saw a doctor, Dr. Ky Barban, on 04/18/21 about that and she has a UTI. She says what she has is a rash under her breasts and abdomen. She says it was there when she was seen in the office, but it has gotten worse. She says it's red, no drainage, itches and painful at a 3 on the pain scale, itching a 3. She says she hasn't used any OTC creams for it. She is wanting a prescription. I advised I will send this to Dr. Ancil Boozer and if an appointment is required, someone will call to schedule. I didn't schedule since she was already seen by a provider 2 days ago. She says she can do a phone visit. Care advice given, patient verbalized understanding.   Reason for Disposition  Red, moist, irritated area between skin folds (or under larger breasts)  Answer Assessment - Initial Assessment Questions 1. APPEARANCE of RASH: "Describe the rash."      Red, sore 2. LOCATION: "Where is the rash located?"      Underneath abdomen and breasts 3. NUMBER: "How many spots are there?"      Too numerous to count, one long area 4. SIZE: "How big are the spots?" (Inches, centimeters or compare to size of a coin)      N/A 5. ONSET: "When did the rash start?"      2 days ago 6. ITCHING: "Does the rash itch?" If Yes, ask: "How bad is the itch?"  (Scale 0-10; or none, mild, moderate, severe)     3 7. PAIN: "Does the rash hurt?" If Yes, ask: "How bad is the pain?"  (Scale 0-10; or none, mild, moderate, severe)    - NONE (0): no pain    - MILD (1-3): doesn't interfere with normal activities     - MODERATE (4-7): interferes with normal activities or awakens from sleep     - SEVERE (8-10): excruciating pain, unable to do any normal activities     3 8. OTHER SYMPTOMS: "Do you have any other symptoms?" (e.g., fever)     No 9. PREGNANCY: "Is there any chance you are pregnant?" "When was your last menstrual period?"     No  Protocols  used: Rash or Redness - Localized-A-AH

## 2021-04-20 NOTE — Telephone Encounter (Signed)
Patient called, left VM to return the call to the office to discuss symptoms with a nurse.    Message from Valere Dross sent at 04/20/2021 10:20 AM EDT  Pt called in stating she is having some stomach pain, and wanted to see about getting something prescribed.

## 2021-04-20 NOTE — Telephone Encounter (Signed)
Pt called in stating she is having some stomach pain, and wanted to see about getting something prescribed  Called patient to review sx. No answer, LVMTCB.

## 2021-04-21 NOTE — Telephone Encounter (Signed)
Pt aware and will reach out if the otc cream/powders don't work.

## 2021-04-29 ENCOUNTER — Ambulatory Visit (INDEPENDENT_AMBULATORY_CARE_PROVIDER_SITE_OTHER): Payer: Medicare Other

## 2021-04-29 DIAGNOSIS — J449 Chronic obstructive pulmonary disease, unspecified: Secondary | ICD-10-CM

## 2021-04-29 DIAGNOSIS — E1169 Type 2 diabetes mellitus with other specified complication: Secondary | ICD-10-CM | POA: Diagnosis not present

## 2021-04-29 DIAGNOSIS — E669 Obesity, unspecified: Secondary | ICD-10-CM | POA: Diagnosis not present

## 2021-04-29 NOTE — Chronic Care Management (AMB) (Signed)
Chronic Care Management   CCM RN Visit Note  04/29/2021 Name: [name] MRN: [id number] DOB: 03-16-1966  Subjective: [name] is a 55 y.o. year old female who is a primary care patient of Steele Sizer, MD. The care management team was consulted for assistance with disease management and care coordination needs.    Engaged with patient by telephone for follow up visit in response to provider referral for case management and/or care coordination services.   Consent to Services:  The patient was given information about Chronic Care Management services, agreed to services, and gave verbal consent prior to initiation of services.  Please see initial visit note for detailed documentation.    Assessment: Review of patient past medical history, allergies, medications, health status, including review of consultants reports, laboratory and other test data, was performed as part of comprehensive evaluation and provision of chronic care management services.   SDOH (Social Determinants of Health) assessments and interventions performed:  No  CCM Care Plan  Allergies  Allergen Reactions   Mirtazapine Anaphylaxis    REACTION: closes throat   Morphine And Related     anaphylaxis   Sulfamethoxazole-Trimethoprim Rash    REACTION: Whelps all over   Vicodin Hp [Hydrocodone-Acetaminophen]    Amoxicillin Er Nausea And Vomiting    Pt doesn't remember problem with amoxicillin -  SE Has patient had a PCN reaction causing immediate rash, facial/tongue/throat swelling, SOB or lightheadedness with hypotension: No Has patient had a PCN reaction causing severe rash involving mucus membranes or skin necrosis: No Has patient had a PCN reaction that required hospitalization: No Has patient had a PCN reaction occurring within the last 10 years: Yes If all of the above answers are "NO", then may proceed with Cephalosporin use.   Codeine Itching    REACTION: itch, rash   Hydrocodone-Acetaminophen  Rash    REACTION: rash   Keflex [Cephalexin] Rash    Pt does not remember having a reaction or rash, no airway involvement   Prasterone Nausea And Vomiting    Outpatient Encounter Medications as of 04/29/2021  Medication Sig   atorvastatin (LIPITOR) 80 MG tablet Take 80 mg by mouth at bedtime.    busPIRone (BUSPAR) 30 MG tablet Take 30 mg by mouth 2 (two) times daily.   citalopram (CELEXA) 20 MG tablet Take 20 mg by mouth daily.   dexlansoprazole (DEXILANT) 60 MG capsule Take 1 capsule (60 mg total) by mouth daily.   ezetimibe (ZETIA) 10 MG tablet Take 10 mg by mouth at bedtime.   fenofibrate micronized (LOFIBRA) 134 MG capsule Take 134 mg by mouth at bedtime.   fluconazole (DIFLUCAN) 150 MG tablet Take 1 tablet (150 mg total) by mouth every other day.   gabapentin (NEURONTIN) 300 MG capsule Take 300 mg by mouth 2 (two) times daily.   gabapentin (NEURONTIN) 600 MG tablet Take 600 mg by mouth at bedtime.   GLYXAMBI 25-5 MG TABS Take 1 tablet by mouth daily.   HUMULIN R U-500 KWIKPEN 500 UNIT/ML kwikpen Inject 70 Units into the skin 3 (three) times daily with meals.   insulin aspart (NOVOLOG FLEXPEN) 100 UNIT/ML FlexPen Inject 3 Units subcutaneously. Sliding scale.   lamoTRIgine (LAMICTAL) 200 MG tablet Take 200 mg by mouth 2 (two) times daily.    lisinopril (ZESTRIL) 2.5 MG tablet Take 1 tablet (2.5 mg total) by mouth daily.   methocarbamol (ROBAXIN) 750 MG tablet methocarbamol 750 mg tablet  Take 1 tablet every 8 hours by oral route.  metoprolol tartrate (LOPRESSOR) 25 MG tablet Take 1 tablet (25 mg total) by mouth 2 (two) times daily.   nitroGLYCERIN (NITROSTAT) 0.4 MG SL tablet Place 1 tablet (0.4 mg total) under the tongue every 5 (five) minutes as needed for chest pain.   OneTouch Delica Lancets 31S MISC by Does not apply route.   promethazine (PHENERGAN) 12.5 MG tablet TAKE 1 TABLET BY MOUTH EVERY 6 HOURS AS NEEDED FOR NAUSEA OR VOMITING   QUEtiapine (SEROQUEL) 200 MG tablet Take  200 mg by mouth at bedtime. Along with 322m dose for a total of 5070m  QUEtiapine (SEROQUEL) 300 MG tablet Take 300 mg by mouth at bedtime. Along with 20012mose for a total of 500m14mSUMAtriptan (IMITREX) 100 MG tablet Take 1 tablet (100 mg total) by mouth once for 1 dose. May repeat in 2 hours if headache persists or recurs.   SYNTHROID 100 MCG tablet Take 100 mcg by mouth daily.   TRULICITY 1.5 MG/0HF/0.2OVN Inject 1.5 mg into the skin once a week. (Wednesdays)   No facility-administered encounter medications on file as of 04/29/2021.    Patient Active Problem List   Diagnosis Date Noted   Atherosclerosis of aorta (HCC)[place]/25/2022   Iron deficiency anemia due to chronic blood loss    Polyp of ascending colon    Vitamin B12 deficiency 08/08/2018   Patient is full code 10/30/2017   Coronary artery disease of native artery of native heart with stable angina pectoris (HCC)[place]/01/2018   Plantar fascial fibromatosis 10/09/2017   Tendinitis of wrist 10/09/2017   Schizoaffective disorder, bipolar type (HCC)[place]/25/2019   Adenomatous colon polyp 10/15/2015   Bipolar affective disorder (HCC)[place]/06/2016   CN (constipation) 10/15/2015   HLD (hyperlipidemia) 10/15/2015   Hypoxemia 10/15/2015   NASH (nonalcoholic steatohepatitis) 10/15/2015   Morbid obesity (HCC)[place]/06/2016   Restless leg syndrome 10/15/2015   Type 2 diabetes, uncontrolled, with neuropathy (HCC)[employer]/06/2016   Low back pain with left-sided sciatica 08/18/2015   Apnea, sleep 05/05/2015   Heart valve disease 05/05/2015   Chronic kidney disease, stage III (moderate) (HCC)[place]/07/2015   Migraine 03/12/2015   Chronic bronchitis (HCC)[place]/18/2016   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HCC)[place]/18/2016   GERD (gastroesophageal reflux disease) 01/20/2015   Hypothyroid    Vitamin D deficiency    Urinary incontinence    Liver lesion    Hypomagnesemia    Obstructive sleep apnea 10/20/2013   PERIPHERAL NEUROPATHY 03/04/2009   INTERSTITIAL  CYSTITIS 07/04/2007   Allergic rhinitis 07/04/2007    Conditions to be addressed/monitored:DMII and COPD  Patient Care Plan: COPD (Adult)     Problem Identified: Symptom Exacerbation (COPD)      Long-Range Goal: Symptom Exacerbation Prevented or Minimized   Start Date: 01/27/2021  Expected End Date: 05/27/2021  Priority: High  Note:    Current Barriers:  Chronic Disease Management support and educational needs r/t COPD  Case Manager Clinical Goal(s): Over the next 120 days, patient will not require hospitalization for complications r/t COPD exacerbation   Interventions:  Collaboration with SowlSteele Sizer regarding development and update of comprehensive plan of care as evidenced by provider attestation and co-signature Inter-disciplinary care team collaboration (see longitudinal plan of care) Reviewed medications and compliance with current treatment plan. Reports symptoms have been well controlled since the last outreach. She has noticed mild sinus congestion but overall feels that she is doing well. Denies episodes of shortness of breath at rest. No changes or decline in  activity tolerance.  Reviewed current action plan and reinforced importance of daily self assessment.  Reviewed information regarding infection prevention and increased risk r/t COPD.   Reviewed worsening symptoms that require immediate medical attention.    Self-Care Activities/Patient Goals: Take medications and utilize inhalers as prescribed Assess symptoms daily and notify provider if symptoms persist.  Avoid extreme temperatures and strenuous activities. Follow recommendations to prevent respiratory infection Notify provider or care management team with questions and new concerns as needed    Follow Up Plan:  Will follow up in two months      Patient Care Plan: Diabetes Type 2 (Adult)     Problem Identified: Disease Progression (Diabetes, Type 2)      Long-Range Goal: Disease  Progression Prevented or Minimized   Start Date: 01/27/2021  Expected End Date: 05/27/2021  Priority: High  Note:   Objective:   Lab Results  Component Value Date   HGBA1C 7.7 02/03/2021    Current Barriers:  Chronic disease management support and educational needs related to Diabetes self-management   Case Manager Clinical Goal(s):  Over the next 120 days, patient will demonstrate improved adherence to prescribed treatment plan for Diabetes management as evidenced by taking medications as prescribed, monitoring and recording CBGs, and adherence to an ADA/ carb modified diet.   Interventions:  Collaboration with Steele Sizer, MD regarding development and update of comprehensive plan of care as evidenced by provider attestation and co-signature Inter-disciplinary care team collaboration (see longitudinal plan of care) Reviewed medications blood glucose readings and compliance with treatment plan. Reports excellent compliance with medications. Reviewed s/sx of hypoglycemia and hyperglycemia. Reviewed blood glucose readings. Continues to note elevated readings but reports fasting levels have improved. Her last A1C improved form 8.7 to 7.7. Reports some improvements with nutritional intak. Encouraged to continue monitoring and recording readings.    Patient Goals/Self-Care Activities Self-administer medications as prescribed Utilize sliding scale insulin for elevated readings as prescribed Attend all scheduled provider appointments Monitor blood glucose levels consistently and utilize recommended interventions Adhere to prescribed ADA/carb modified Notify provider or care management team with questions and new concerns as needed   Follow Up Plan:  Will follow up in two months       PLAN A member of the care management team will follow up with Mrs. Vanauken in two months.   Cristy Friedlander [facility] Care Management [facility] [phone]

## 2021-04-29 NOTE — Patient Instructions (Addendum)
Thank you for allowing the Chronic Care Management team to participate in your care.    Patient Care Plan: COPD (Adult)     Problem Identified: Symptom Exacerbation (COPD)      Long-Range Goal: Symptom Exacerbation Prevented or Minimized   Start Date: 01/27/2021  Expected End Date: 05/27/2021  Priority: High  Note:    Current Barriers:  Chronic Disease Management support and educational needs r/t COPD  Case Manager Clinical Goal(s): Over the next 120 days, patient will not require hospitalization for complications r/t COPD exacerbation   Interventions:  Collaboration with Stacey Sizer, MD regarding development and update of comprehensive plan of care as evidenced by provider attestation and co-signature Inter-disciplinary care team collaboration (see longitudinal plan of care) Reviewed medications and compliance with current treatment plan. Reports symptoms have been well controlled since the last outreach. She has noticed mild sinus congestion but overall feels that she is doing well. Denies episodes of shortness of breath at rest. No changes or decline in activity tolerance.  Reviewed current action plan and reinforced importance of daily self assessment.  Reviewed information regarding infection prevention and increased risk r/t COPD.   Reviewed worsening symptoms that require immediate medical attention.    Self-Care Activities/Patient Goals: Take medications and utilize inhalers as prescribed Assess symptoms daily and notify provider if symptoms persist.  Avoid extreme temperatures and strenuous activities. Follow recommendations to prevent respiratory infection Notify provider or care management team with questions and new concerns as needed    Follow Up Plan:  Will follow up in two months      Patient Care Plan: Diabetes Type 2 (Adult)     Problem Identified: Disease Progression (Diabetes, Type 2)      Long-Range Goal: Disease Progression Prevented or  Minimized   Start Date: 01/27/2021  Expected End Date: 05/27/2021  Priority: High  Note:   Objective:   Lab Results  Component Value Date   HGBA1C 7.7 02/03/2021    Current Barriers:  Chronic disease management support and educational needs related to Diabetes self-management   Case Manager Clinical Goal(s):  Over the next 120 days, patient will demonstrate improved adherence to prescribed treatment plan for Diabetes management as evidenced by taking medications as prescribed, monitoring and recording CBGs, and adherence to an ADA/ carb modified diet.   Interventions:  Collaboration with Stacey Sizer, MD regarding development and update of comprehensive plan of care as evidenced by provider attestation and co-signature Inter-disciplinary care team collaboration (see longitudinal plan of care) Reviewed medications blood glucose readings and compliance with treatment plan. Reports excellent compliance with medications. Reviewed s/sx of hypoglycemia and hyperglycemia. Reviewed blood glucose readings. Continues to note elevated readings but reports fasting levels have improved. Her last A1C improved form 8.7 to 7.7. Reports some improvements with nutritional intak. Encouraged to continue monitoring and recording readings.    Patient Goals/Self-Care Activities Self-administer medications as prescribed Utilize sliding scale insulin for elevated readings as prescribed Attend all scheduled provider appointments Monitor blood glucose levels consistently and utilize recommended interventions Adhere to prescribed ADA/carb modified Notify provider or care management team with questions and new concerns as needed   Follow Up Plan:  Will follow up in two months        [name] verbalized understanding of the information discussed during the telephonic outreach.. Declined need for mailed/printed instructions. A member of the care management team will follow up in two  months.    [name] [facility] Care Management [facility] [phone]

## 2021-05-06 ENCOUNTER — Other Ambulatory Visit: Payer: Self-pay | Admitting: Family Medicine

## 2021-05-06 DIAGNOSIS — G43009 Migraine without aura, not intractable, without status migrainosus: Secondary | ICD-10-CM

## 2021-05-17 ENCOUNTER — Ambulatory Visit: Payer: Self-pay | Admitting: *Deleted

## 2021-05-17 NOTE — Telephone Encounter (Signed)
Pt c/o red rash around belly button that is spreading down "to my stomach." Pt could not tell me how many spots there are. Pt stated that she has scratched it so much that there are sores. Pt is a diabetic. Advised pt to cut nails short and wash hands often with an antibacterial Soap. Pt is on moderate pain. Pt was advised by a prior provider to not use neosporin or hydrocortisone cream "because it will make rash spread." Care advice given and pt verbalized understanding. No appt at [facility] so NT called and spoke with Ebony Hail . Was asked to transfer pt to [facility] to try and pt appt.       Reason for Disposition  [1] Looks infected (spreading redness, pus) AND [2] no fever  Answer Assessment - Initial Assessment Questions 1. APPEARANCE of RASH: "Describe the rash."      Belly button around  color is red 2. LOCATION: "Where is the rash located?"      Around belly button and spreading down to stomach 3. NUMBER: "How many spots are there?"      Poor his 4. SIZE:"How big are the spots?" (Inches, centimeters or compare to size of a coin)     Inch 1.4 inches 5. ONSET: "When did the rash start?"      Last month 6. ITCHING: "Does the rash itch?" If Yes, ask: "How bad is the itch?"  (Scale 0-10; or none, mild, moderate, severe)     Yes- severe- sores from scratch 7. PAIN: "Does the rash hurt?" If Yes, ask: "How bad is the pain?"  (Scale 0-10; or none, mild, moderate, severe)    - NONE (0): no pain    - MILD (1-3): doesn't interfere with normal activities     - MODERATE (4-7): interferes with normal activities or awakens from sleep     - SEVERE (8-10): excruciating pain, unable to do any normal activities     moderate 8. OTHER SYMPTOMS: "Do you have any other symptoms?" (e.g., fever)     no 9. PREGNANCY: "Is there any chance you are pregnant?" "When was your last menstrual period?"     Partial hysterectomy  Protocols used: Rash or Redness - Localized-A-AH

## 2021-05-17 NOTE — Telephone Encounter (Signed)
Pt called stating that she has a rash on her stomach. She states that she has had this x 1 month and has seen PCP for it, but it is getting worse and spreading. She states that she has scratched it to the point that she has sores. Please advise.   Attempted to call patient- left message to call office

## 2021-05-19 ENCOUNTER — Other Ambulatory Visit: Payer: Self-pay

## 2021-05-19 ENCOUNTER — Ambulatory Visit (INDEPENDENT_AMBULATORY_CARE_PROVIDER_SITE_OTHER): Payer: Medicare Other | Admitting: Unknown Physician Specialty

## 2021-05-19 ENCOUNTER — Encounter: Payer: Self-pay | Admitting: Unknown Physician Specialty

## 2021-05-19 VITALS — BP 132/74 | HR 96 | Temp 98.3°F | Resp 18 | Ht 59.0 in | Wt 192.6 lb

## 2021-05-19 DIAGNOSIS — Z23 Encounter for immunization: Secondary | ICD-10-CM | POA: Diagnosis not present

## 2021-05-19 DIAGNOSIS — L0100 Impetigo, unspecified: Secondary | ICD-10-CM | POA: Diagnosis not present

## 2021-05-19 MED ORDER — DOXYCYCLINE HYCLATE 100 MG PO TABS: 100.0000 mg | ORAL_TABLET | Freq: Two times a day (BID) | ORAL | 0 refills | Status: DC

## 2021-05-19 MED ORDER — MUPIROCIN 2 % EX OINT: TOPICAL_OINTMENT | Freq: Three times a day (TID) | CUTANEOUS | 0 refills | Status: DC

## 2021-05-19 NOTE — Progress Notes (Signed)
BP 132/74 (BP Location: Left Arm, Patient Position: Sitting, Cuff Size: Large)   Pulse 96   Temp 98.3 F (36.8 C) (Oral)   Resp 18   Ht 4' 11"  (1.499 m) Comment: per chart  Wt 192 lb 9.6 oz (87.4 kg)   SpO2 98%   BMI 38.90 kg/m    Subjective:    Patient ID: [name], female    DOB: 03-17-1966, 55 y.o.   MRN: [id number]  HPI: [name] is a 55 y.o. female  Chief Complaint  Patient presents with   Rash    Abdomen x2+ months   Pt with abdominal rash thought last visit to be tinea.  Nystatin given but not helping.  States blood sugar has been below 200/  No fever.  Has lesions on her head that are also dry and painful.   Relevant past medical, surgical, family and social history reviewed and updated as indicated. Interim medical history since our last visit reviewed. Allergies and medications reviewed and updated.  Review of Systems  Per HPI unless specifically indicated above     Objective:    BP 132/74 (BP Location: Left Arm, Patient Position: Sitting, Cuff Size: Large)   Pulse 96   Temp 98.3 F (36.8 C) (Oral)   Resp 18   Ht 4' 11"  (1.499 m) Comment: per chart  Wt 192 lb 9.6 oz (87.4 kg)   SpO2 98%   BMI 38.90 kg/m   Wt Readings from Last 3 Encounters:  05/19/21 192 lb 9.6 oz (87.4 kg)  03/28/21 190 lb (86.2 kg)  03/10/21 181 lb 14.4 oz (82.5 kg)    Physical Exam Constitutional:      General: She is not in acute distress.    Appearance: Normal appearance. She is well-developed.  HENT:     Head: Normocephalic and atraumatic.  Eyes:     General: Lids are normal. No scleral icterus.       Right eye: No discharge.        Left eye: No discharge.     Conjunctiva/sclera: Conjunctivae normal.  Neck:     Vascular: No carotid bruit or JVD.  Cardiovascular:     Rate and Rhythm: Normal rate and regular rhythm.     Heart sounds: Normal heart sounds.  Pulmonary:     Effort: Pulmonary effort is normal. No respiratory distress.     Breath sounds: Normal  breath sounds.  Abdominal:     Palpations: There is no hepatomegaly or splenomegaly.  Musculoskeletal:        General: Normal range of motion.     Cervical back: Normal range of motion and neck supple.  Skin:    General: Skin is warm and dry.     Coloration: Skin is not pale.     Findings: No rash.     Comments: Pts with 2 papular lesion on scalp, not a dermatomal distribution.  Ulcerated areas around umbilicus, non draining but tender.    Neurological:     Mental Status: She is alert and oriented to person, place, and time.  Psychiatric:        Behavior: Behavior normal.        Thought Content: Thought content normal.        Judgment: Judgment normal.    Results for orders placed or performed in visit on 04/18/21  Urine Culture   Specimen: Urine  Result Value Ref Range   MICRO NUMBER: [id number]    SPECIMEN QUALITY: Adequate  Sample Source URINE    STATUS: FINAL    Result: No Growth   POCT urinalysis dipstick  Result Value Ref Range   Color, UA yellow    Clarity, UA clear    Glucose, UA Positive (A) Negative   Bilirubin, UA negative    Ketones, UA negative    Spec Grav, UA 1.020 1.010 - 1.025   Blood, UA trace    pH, UA 6.0 5.0 - 8.0   Protein, UA Positive (A) Negative   Urobilinogen, UA 0.2 0.2 or 1.0 E.U./dL   Nitrite, UA negative    Leukocytes, UA Small (1+) (A) Negative   Appearance clear    Odor none       Assessment & Plan:   Problem List Items Addressed This Visit   None Visit Diagnoses     Impetigo    -  Primary   Ulcerated areas that are tender.  R/o MRSA.  Rx for Doxycycline and refer to dermatology if no improvement.  Bactroban cream.    Relevant Medications   mupirocin ointment (BACTROBAN) 2 %       Update Zostavax and flu shot  Follow up plan: Return if symptoms worsen or fail to improve.

## 2021-05-23 ENCOUNTER — Telehealth: Payer: Self-pay | Admitting: Gastroenterology

## 2021-05-23 NOTE — Telephone Encounter (Signed)
Pt. Calling to request another medicine for gas. She said  that the one she was prescribed is not working. She does not know the name of it

## 2021-05-24 ENCOUNTER — Telehealth: Payer: Self-pay | Admitting: Gastroenterology

## 2021-05-24 NOTE — Telephone Encounter (Signed)
Spoke with pt regarding her medication issue. Pt stated she didn't know which medicine that wasn't working. I have advised pt to find out which medication it was so we can appropriately change her medicine.

## 2021-05-25 ENCOUNTER — Other Ambulatory Visit: Payer: Self-pay

## 2021-05-25 MED ORDER — ESOMEPRAZOLE MAGNESIUM 40 MG PO CPDR
40.0000 mg | DELAYED_RELEASE_CAPSULE | Freq: Every day | ORAL | 3 refills | Status: DC
Start: 2021-05-25 — End: 2021-09-22

## 2021-05-25 NOTE — Telephone Encounter (Signed)
Pt notified of Nexium medication change per Dr. Allen Norris. Pt will let me know if medication doesn't work.

## 2021-05-30 IMAGING — CT CT ABD-PELV W/ CM
2 of 5 series · 16 of 46 positions shown, 18 images · IV contrast (APPLIED)
Comparison: 07/14/2017

CLINICAL DATA: Lower abdominal pain with nausea

EXAM:
CT ABDOMEN AND PELVIS WITH CONTRAST
TECHNIQUE: Multidetector CT imaging of the abdomen and pelvis was performed
using the standard protocol following bolus administration of
intravenous contrast.
CONTRAST:  100mL OMNIPAQUE IOHEXOL 300 MG/ML  SOLN

[Series 2: routine abd/pel with (person_name) · axial · 0.72mm/px · z∈[-464,-39]mm · 13 of 97 slices shown, 15 images]
[im 6/97  soft-tissue]
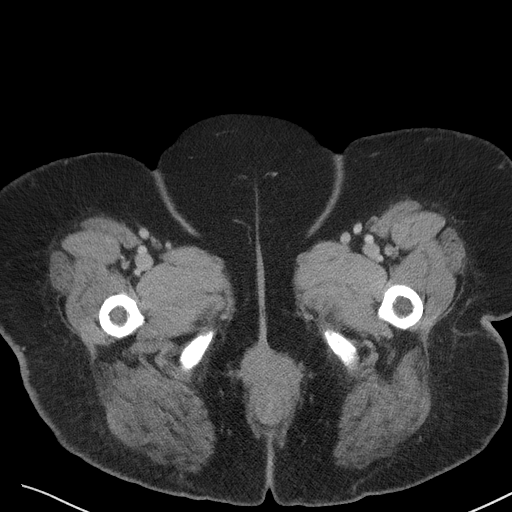
[im 6/97  bone]
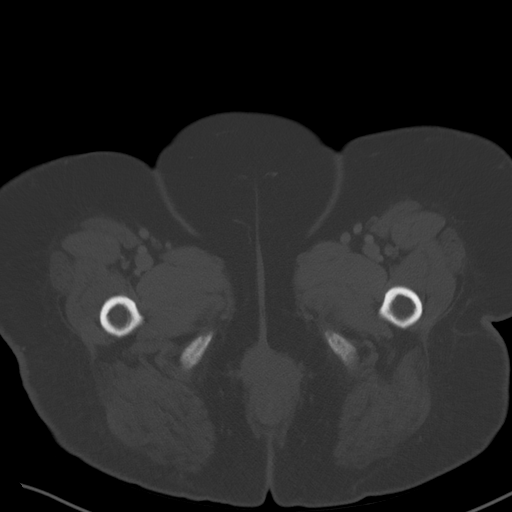
[im 11/97  soft-tissue]
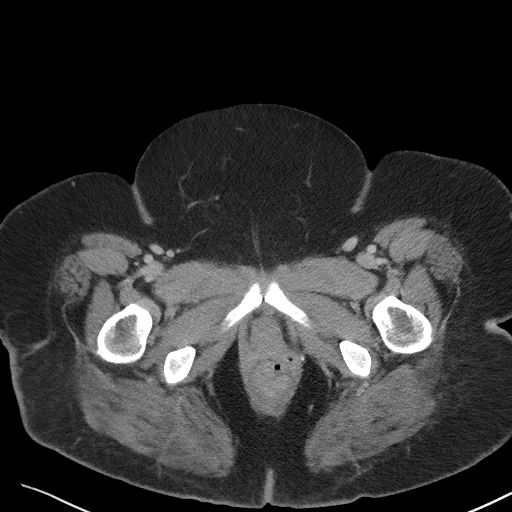
[im 22/97  soft-tissue]
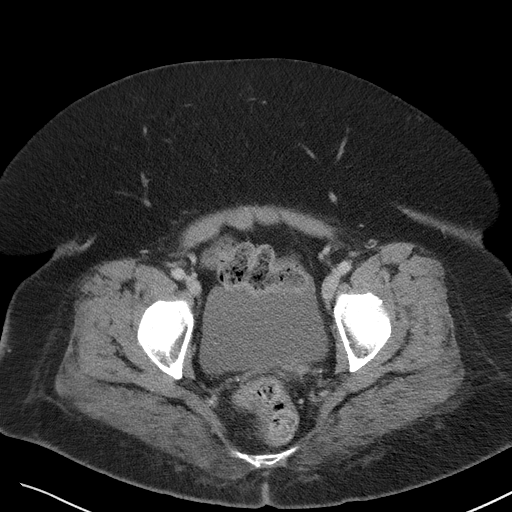
[im 27/97  soft-tissue]
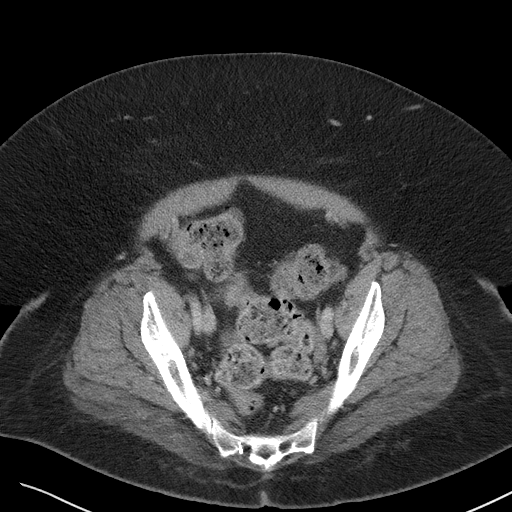
[im 33/97  soft-tissue]
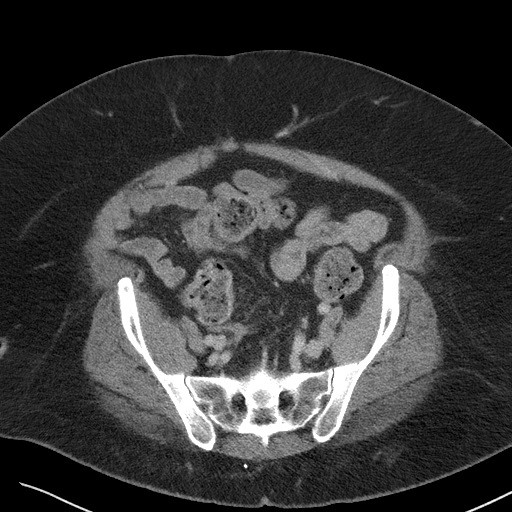
[im 43/97  soft-tissue]
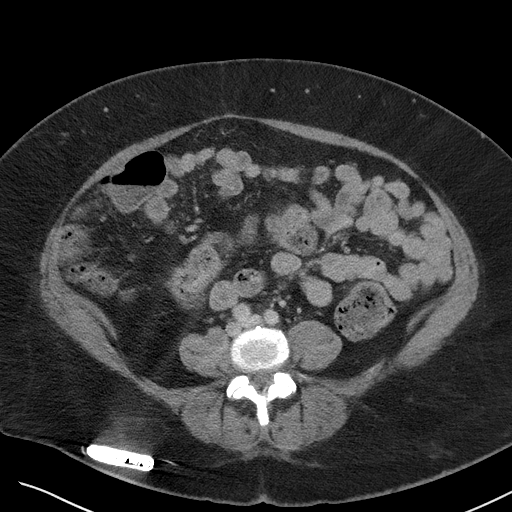
[im 49/97  soft-tissue]
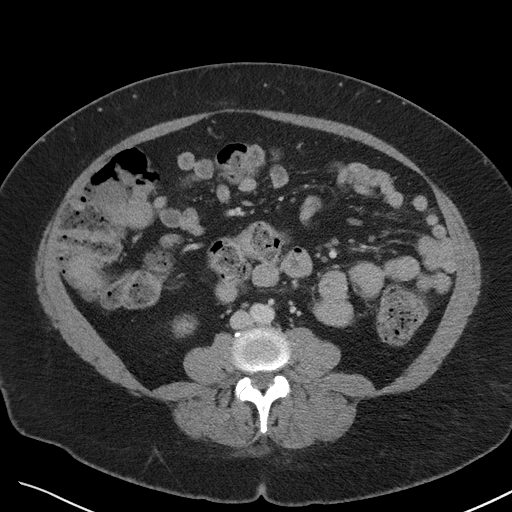
[im 54/97  soft-tissue]
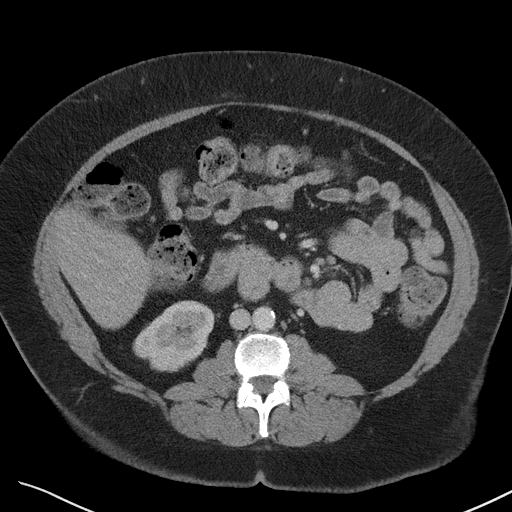
[im 65/97  soft-tissue]
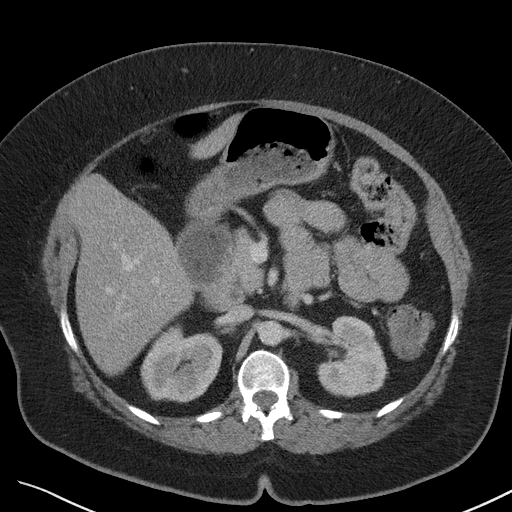
[im 65/97  bone]
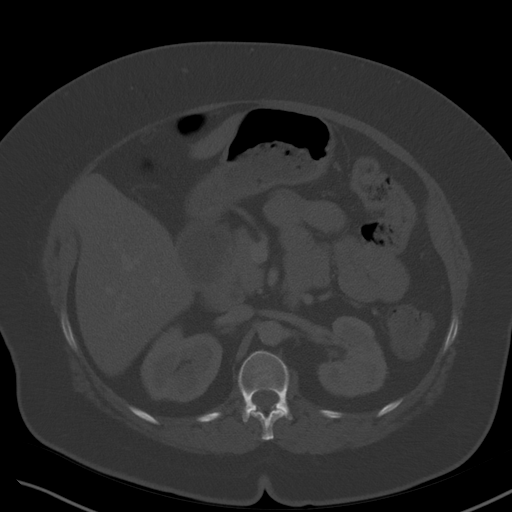
[im 70/97  soft-tissue]
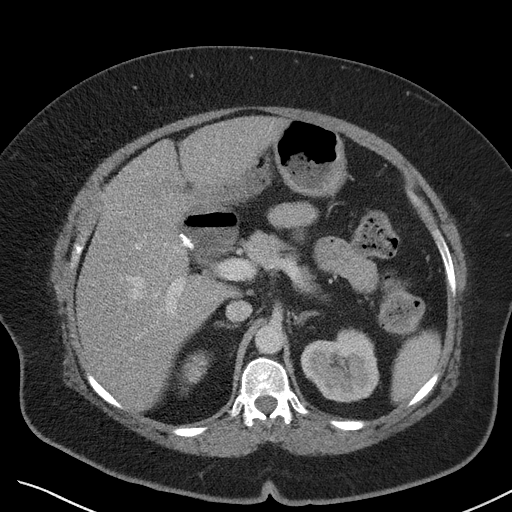
[im 75/97  soft-tissue]
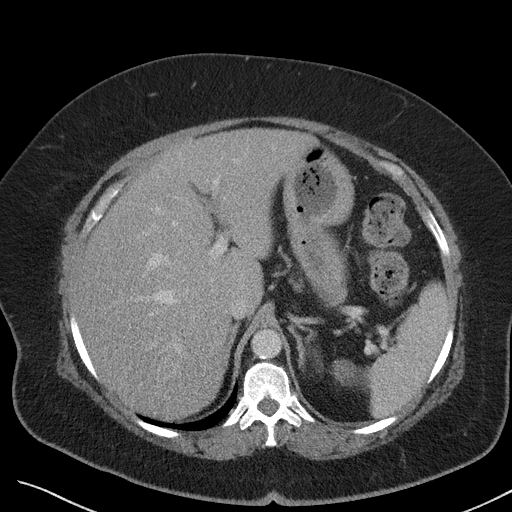
[im 86/97  soft-tissue]
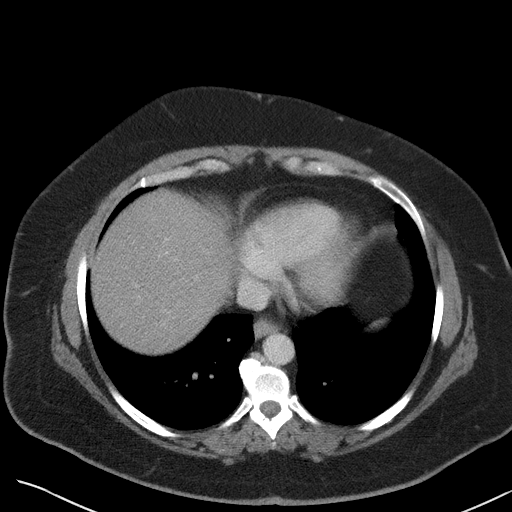
[im 91/97  soft-tissue]
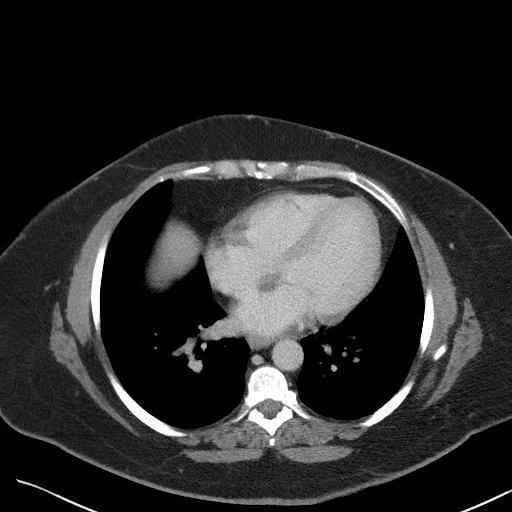

[Series 5: coronal st · coronal · 0.78mm/px · 3 of 114 slices shown]
[im 38/114  soft-tissue]
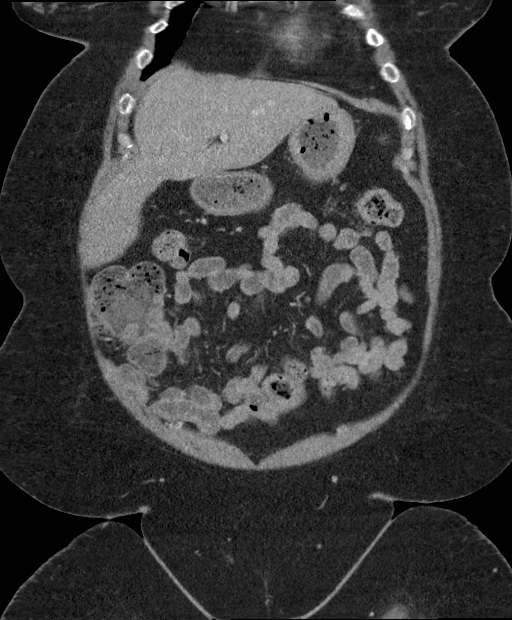
[im 51/114  soft-tissue]
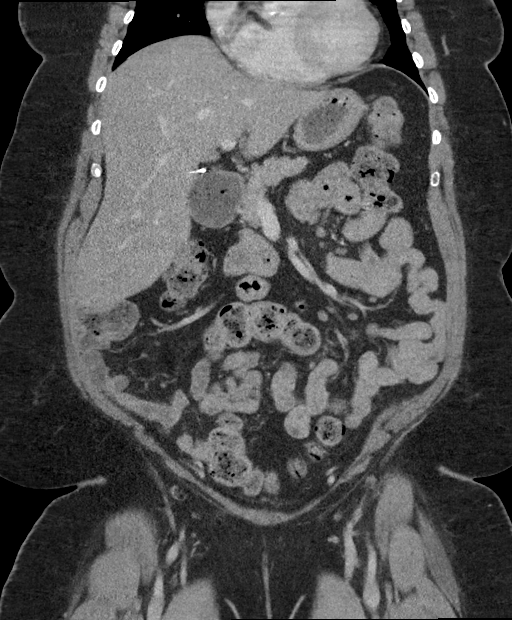
[im 63/114  soft-tissue]
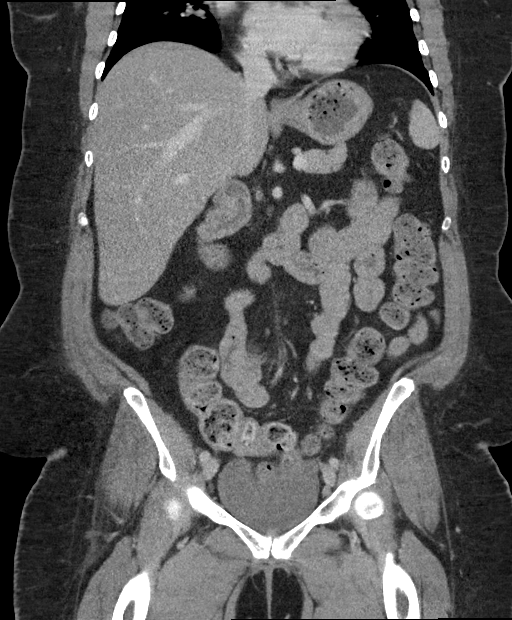

[16 of 46 positions shown; findings below may reference images not displayed]

FINDINGS: Lower chest: No acute abnormality.

Hepatobiliary: Mildly decreased attenuation of the hepatic
parenchyma suggesting hepatic steatosis. No focal liver abnormality
is seen. Status post cholecystectomy. No biliary dilatation.

Pancreas: Unremarkable. No pancreatic ductal dilatation or
surrounding inflammatory changes.

Spleen: Normal in size without focal abnormality.

Adrenals/Urinary Tract: Unremarkable adrenal glands. No renal
lesion, stone, or hydronephrosis. Mildly prominent right extrarenal
pelvis is unchanged from priors. Ureters are nondilated. Urinary
bladder is unremarkable.

Stomach/Bowel: Stomach is within normal limits. Appendix appears
normal. No evidence of bowel wall thickening, distention, or
inflammatory changes. Moderate volume of stool throughout the colon.

Vascular/Lymphatic: Minimal aortic atherosclerosis. No aneurysm. No
abdominopelvic lymphadenopathy.

Reproductive: Status post hysterectomy. No adnexal masses.

Other: Trace amount of fluid within the pelvis. Small fat containing
umbilical hernia. Neural stimulator is noted in the subcutaneous fat
of the right flank with electrode in the region of the right sacral
plexus.

Musculoskeletal: No acute or significant osseous findings.
IMPRESSION: 1. No acute findings in the abdomen or pelvis.
2. Moderate volume of stool throughout the colon may indicate
constipation.
3. Small fat containing umbilical hernia

## 2021-06-05 IMAGING — CR DG CHEST 1V PORT
1 series · 1 of 1 positions shown · non-contrast
Comparison: 04/15/2019 and prior radiographs

CLINICAL DATA: Cough.

EXAM:
PORTABLE CHEST 1 VIEW

[dg chest port 1 view]
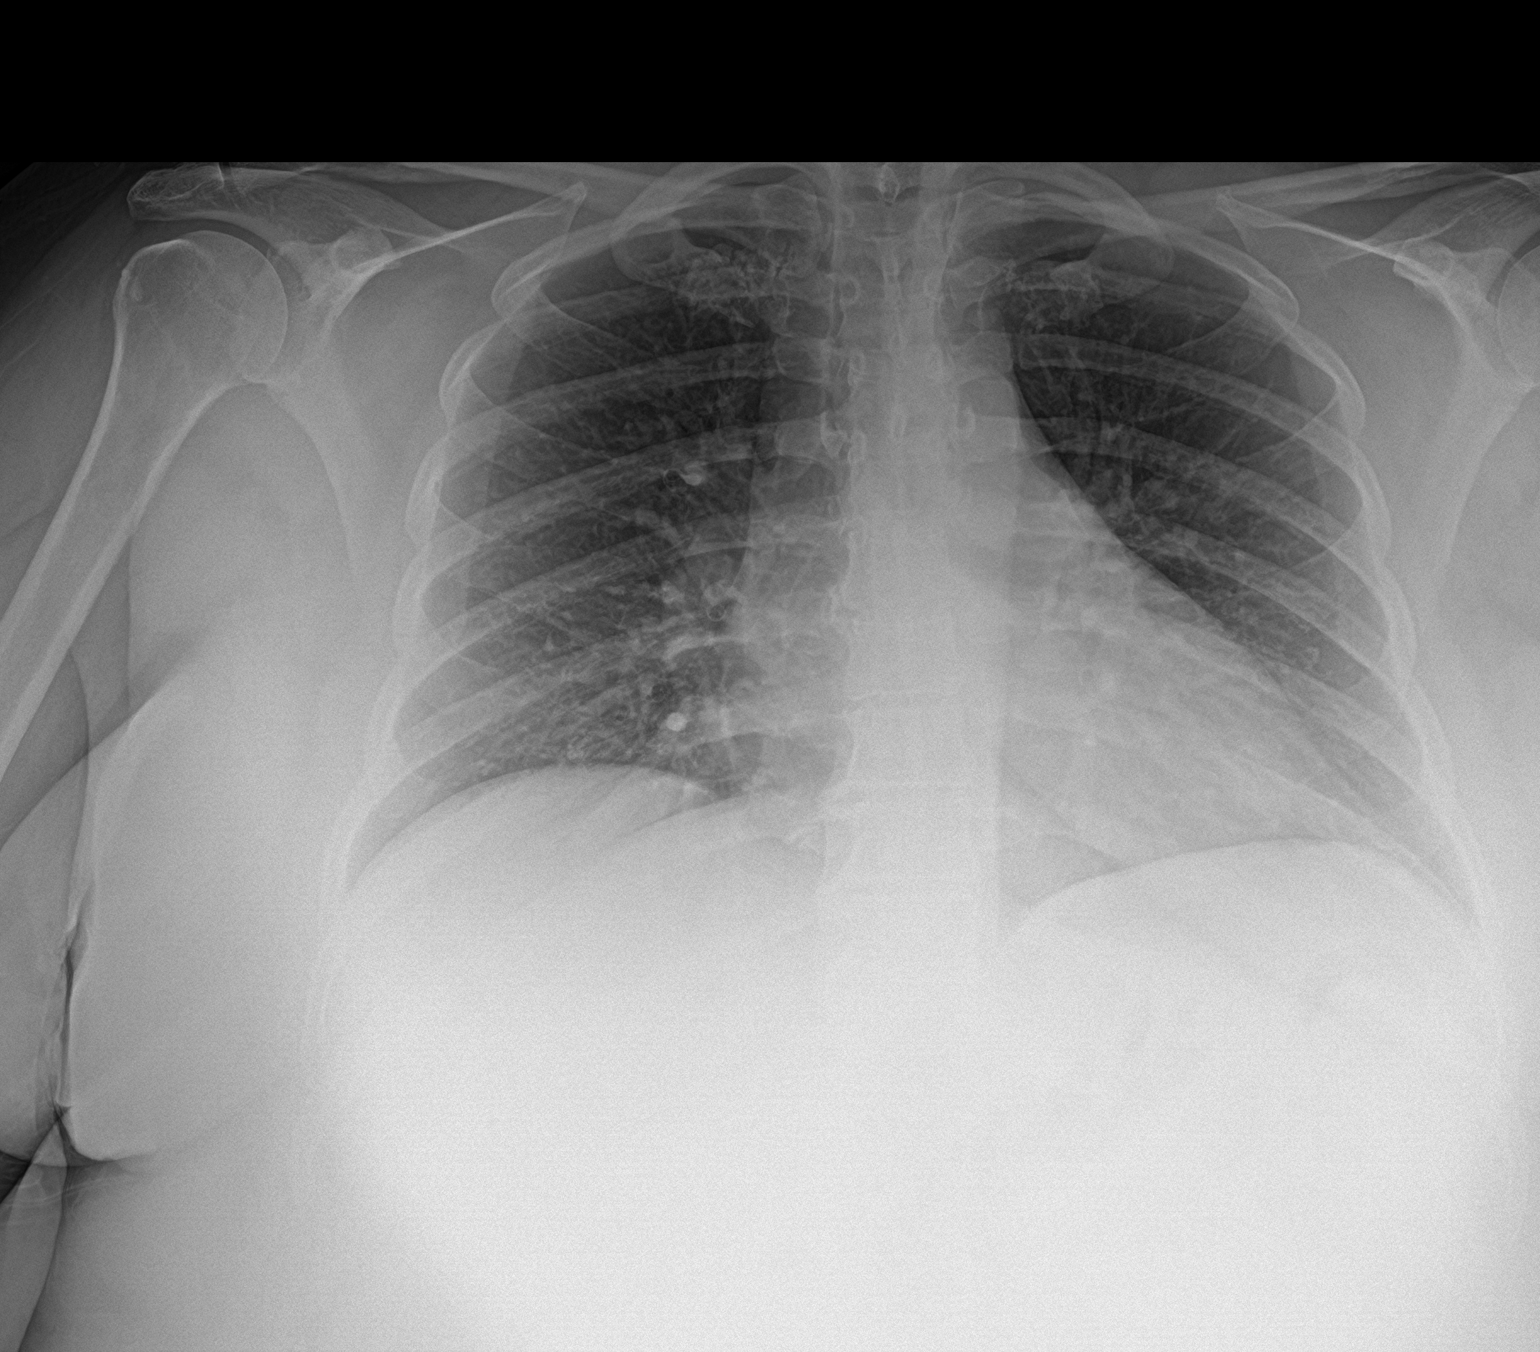

[1 of 1 positions shown; findings below may reference images not displayed]

FINDINGS: The cardiomediastinal silhouette is unremarkable.

Mild peribronchial thickening is unchanged.

There is no evidence of focal airspace disease, pulmonary edema,
suspicious pulmonary nodule/mass, pleural effusion, or pneumothorax.

No acute bony abnormalities are identified.
IMPRESSION: No active disease.

## 2021-06-05 IMAGING — CT CT HEAD W/O CM
3 series · 16 of 45 positions shown, 19 images · non-contrast
Comparison: 01/03/2019

CLINICAL DATA: Weakness, fatigue

EXAM:
CT HEAD WITHOUT CONTRAST
TECHNIQUE: Contiguous axial images were obtained from the base of the skull
through the vertex without intravenous contrast.

[Series 2: head wo · axial · 0.40mm/px · z∈[-126,-11]mm · 10 of 28 slices shown, 13 images]
[im 3/28  brain]
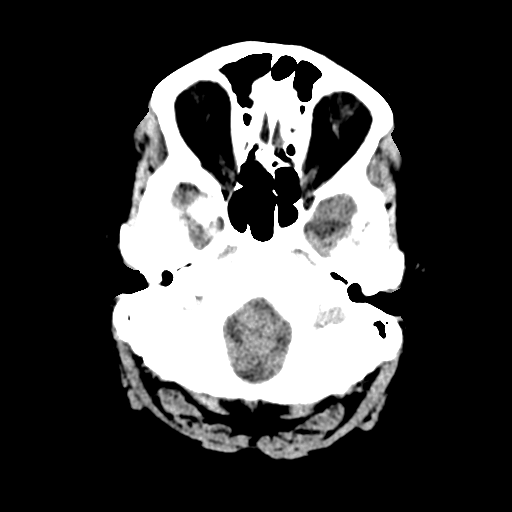
[im 3/28  bone]
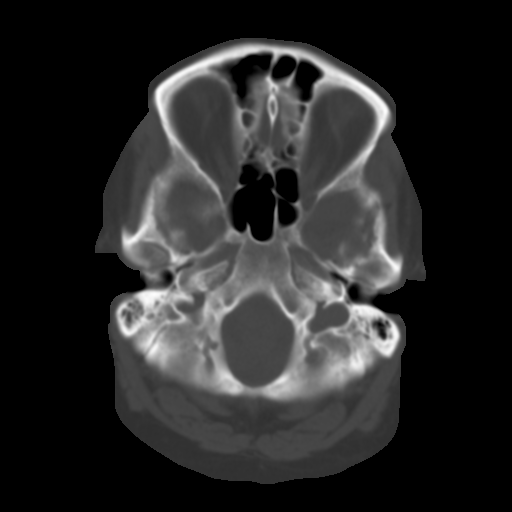
[im 5/28  brain]
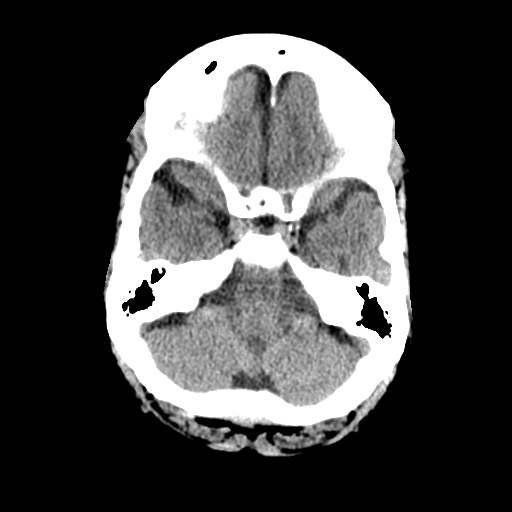
[im 8/28  brain]
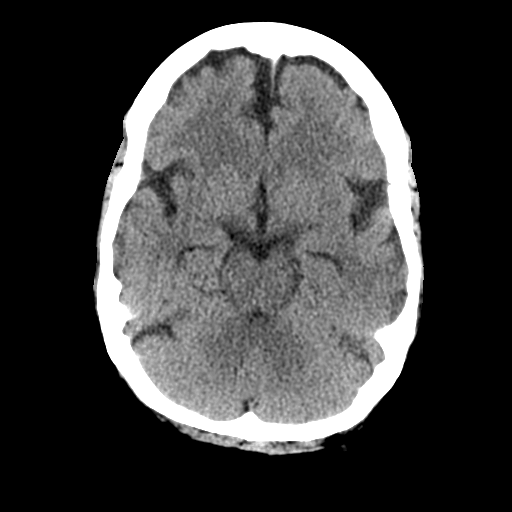
[im 11/28  brain]
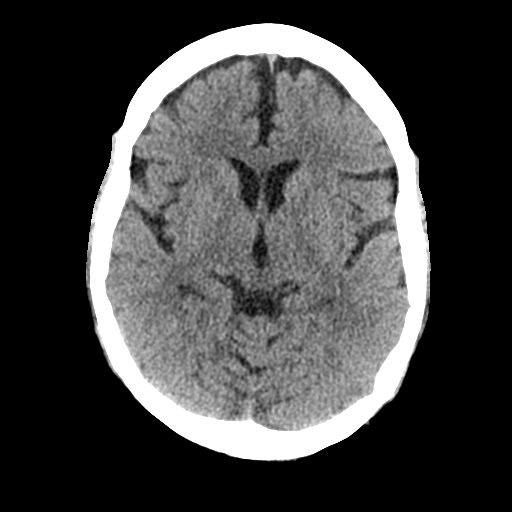
[im 13/28  brain]
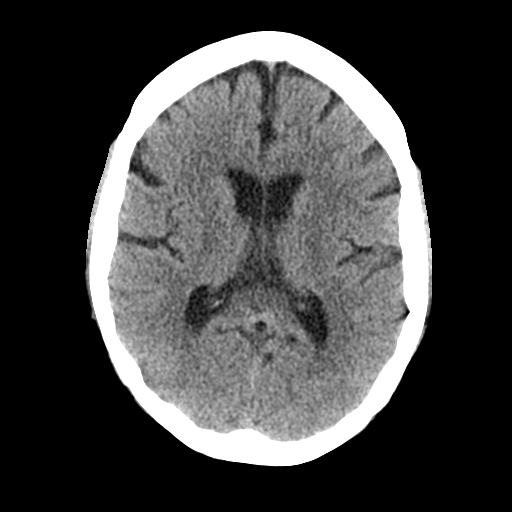
[im 13/28  bone]
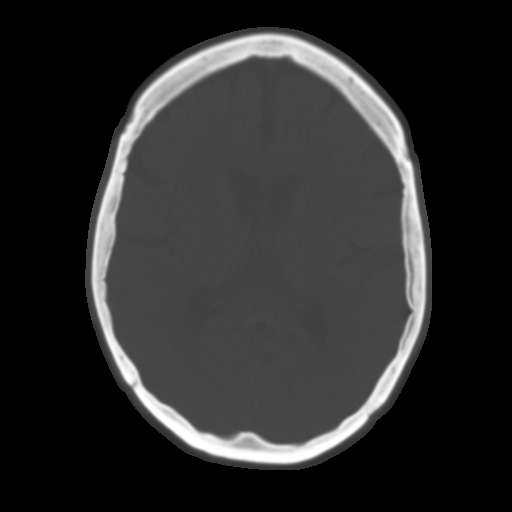
[im 16/28  brain]
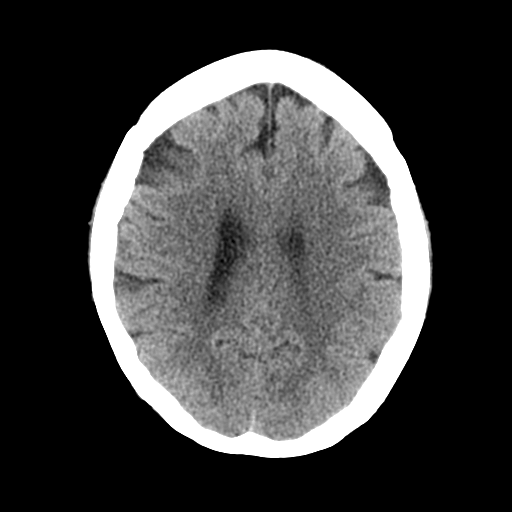
[im 18/28  brain]
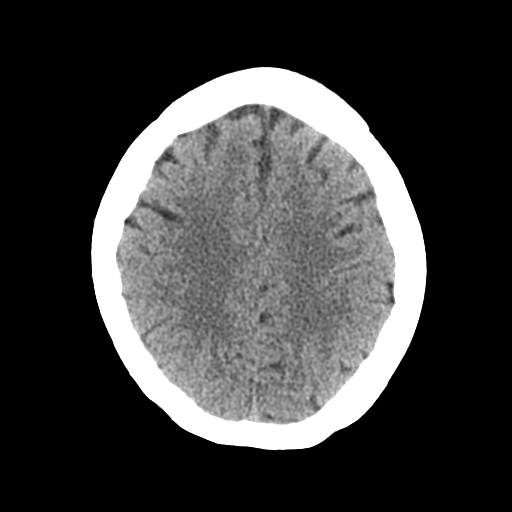
[im 21/28  brain]
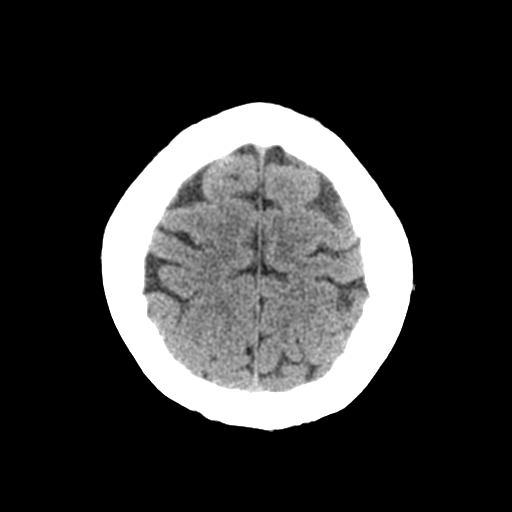
[im 24/28  brain]
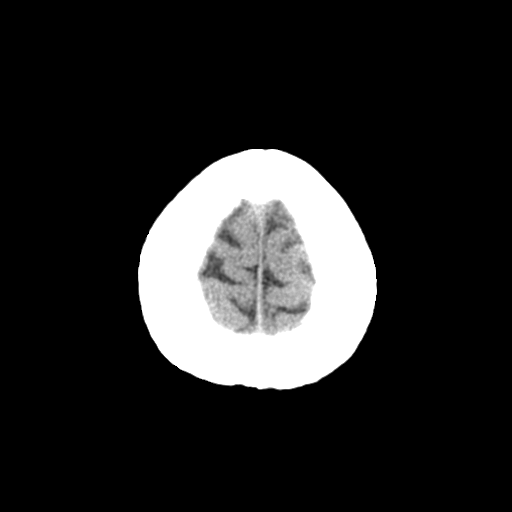
[im 24/28  bone]
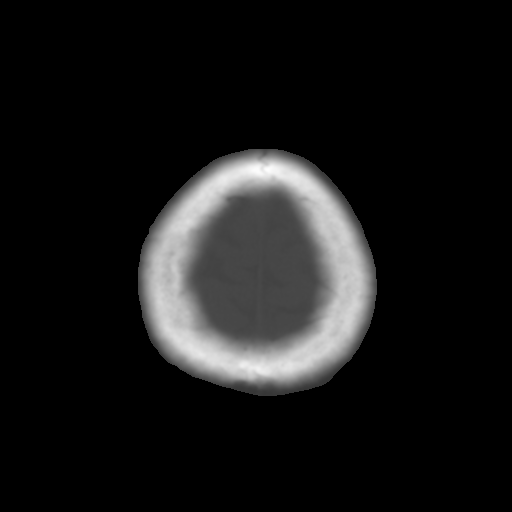
[im 26/28  brain]
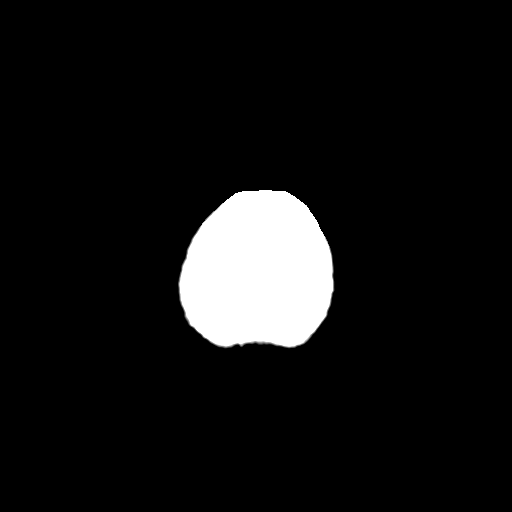

[Series 4: coronal soft tissue · coronal · 0.29mm/px · 3 of 63 slices shown]
[im 21/63  brain]
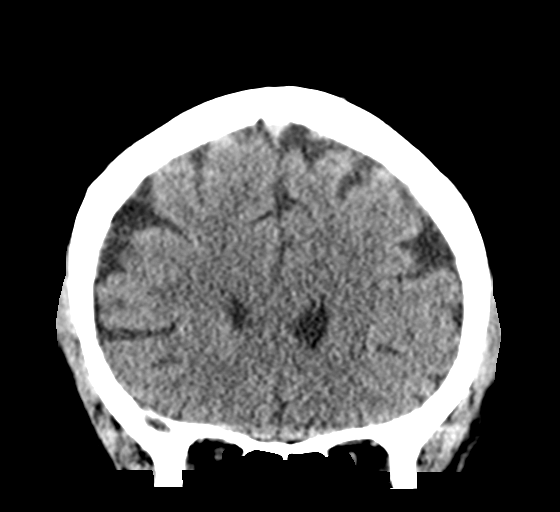
[im 28/63  brain]
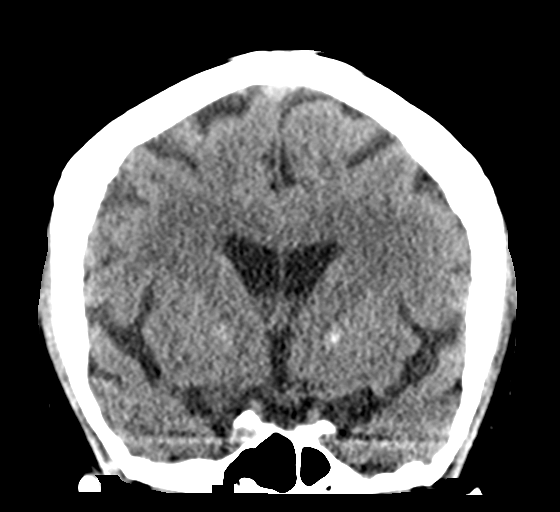
[im 35/63  brain]
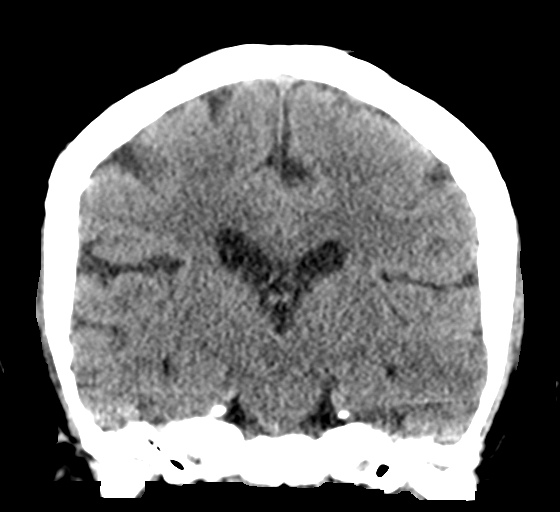

[Series 5: sagittal soft tissue · sagittal · 0.30mm/px · 3 of 59 slices shown]
[im 20/59  brain]
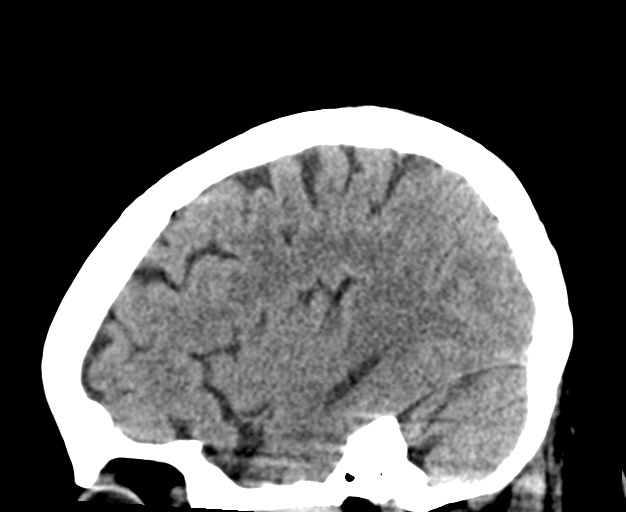
[im 30/59  brain]
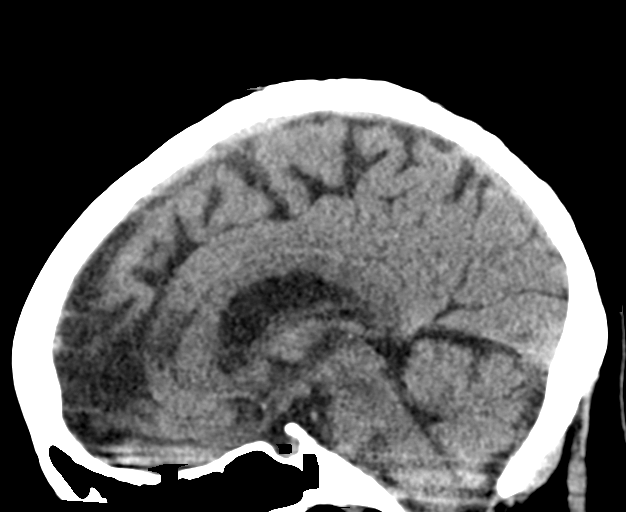
[im 39/59  brain]
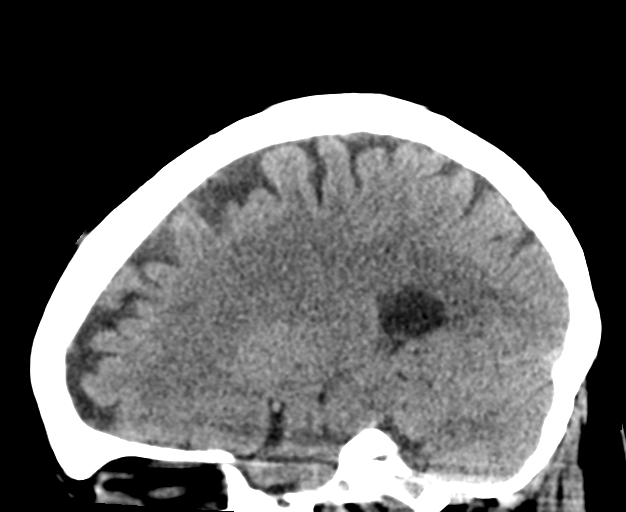

[16 of 45 positions shown; findings below may reference images not displayed]

FINDINGS: Brain: No evidence of acute infarction, hemorrhage, hydrocephalus,
extra-axial collection or mass lesion/mass effect.

Vascular: No hyperdense vessel or unexpected calcification.

Skull: Normal. Negative for fracture or focal lesion.

Sinuses/Orbits: No acute finding.

Other: None.
IMPRESSION: No acute intracranial pathology.

## 2021-06-16 DIAGNOSIS — E7800 Pure hypercholesterolemia, unspecified: Secondary | ICD-10-CM | POA: Diagnosis not present

## 2021-06-16 DIAGNOSIS — E039 Hypothyroidism, unspecified: Secondary | ICD-10-CM | POA: Diagnosis not present

## 2021-06-16 DIAGNOSIS — E1165 Type 2 diabetes mellitus with hyperglycemia: Secondary | ICD-10-CM | POA: Diagnosis not present

## 2021-06-16 DIAGNOSIS — I10 Essential (primary) hypertension: Secondary | ICD-10-CM | POA: Diagnosis not present

## 2021-06-18 LAB — BASIC METABOLIC PANEL
BUN: 25 — AB (ref 4–21)
Chloride: 104 (ref 99–108)
Creatinine: 1.1 (ref 0.5–1.1)
Glucose: 206
Potassium: 4.7 (ref 3.4–5.3)
Sodium: 142 (ref 137–147)

## 2021-06-18 LAB — LIPID PANEL
Cholesterol: 100 (ref 0–200)
HDL: 31 — AB (ref 35–70)
LDL Cholesterol: 39
LDl/HDL Ratio: 3.2
Triglycerides: 179 — AB (ref 40–160)

## 2021-06-18 LAB — TSH: TSH: 0.07 — AB (ref 0.41–5.90)

## 2021-06-18 LAB — CBC AND DIFFERENTIAL
HCT: 38 (ref 36–46)
Hemoglobin: 11.7 — AB (ref 12.0–16.0)
Neutrophils Absolute: 3.3
Platelets: 277 (ref 150–399)
WBC: 6.5

## 2021-06-18 LAB — IRON,TIBC AND FERRITIN PANEL
Ferritin: 46
Iron: 78

## 2021-06-18 LAB — HEPATIC FUNCTION PANEL
ALT: 26 (ref 7–35)
AST: 32 (ref 13–35)
Alkaline Phosphatase: 48 (ref 25–125)
Bilirubin, Total: 0.2

## 2021-06-18 LAB — CBC: RBC: 4.16 (ref 3.87–5.11)

## 2021-06-18 LAB — VITAMIN B12: Vitamin B-12: 359

## 2021-06-18 LAB — HEMOGLOBIN A1C: Hemoglobin A1C: 7.2

## 2021-06-18 LAB — COMPREHENSIVE METABOLIC PANEL
Albumin: 4.2 (ref 3.5–5.0)
Calcium: 10.1 (ref 8.7–10.7)
GFR calc Af Amer: 61
Globulin: 1.9

## 2021-06-24 DIAGNOSIS — E7800 Pure hypercholesterolemia, unspecified: Secondary | ICD-10-CM | POA: Diagnosis not present

## 2021-06-24 DIAGNOSIS — E039 Hypothyroidism, unspecified: Secondary | ICD-10-CM | POA: Diagnosis not present

## 2021-06-24 DIAGNOSIS — I10 Essential (primary) hypertension: Secondary | ICD-10-CM | POA: Diagnosis not present

## 2021-06-24 DIAGNOSIS — D631 Anemia in chronic kidney disease: Secondary | ICD-10-CM | POA: Diagnosis not present

## 2021-06-28 ENCOUNTER — Encounter: Payer: Self-pay | Admitting: Family Medicine

## 2021-06-30 ENCOUNTER — Other Ambulatory Visit: Payer: Self-pay

## 2021-06-30 ENCOUNTER — Ambulatory Visit (INDEPENDENT_AMBULATORY_CARE_PROVIDER_SITE_OTHER): Payer: Medicare Other

## 2021-06-30 ENCOUNTER — Ambulatory Visit (INDEPENDENT_AMBULATORY_CARE_PROVIDER_SITE_OTHER): Payer: Medicare Other | Admitting: Family Medicine

## 2021-06-30 ENCOUNTER — Encounter: Payer: Self-pay | Admitting: Family Medicine

## 2021-06-30 VITALS — BP 128/82 | HR 92 | Temp 98.1°F | Resp 16 | Ht 59.0 in | Wt 199.1 lb

## 2021-06-30 DIAGNOSIS — J410 Simple chronic bronchitis: Secondary | ICD-10-CM

## 2021-06-30 DIAGNOSIS — G2581 Restless legs syndrome: Secondary | ICD-10-CM

## 2021-06-30 DIAGNOSIS — I25118 Atherosclerotic heart disease of native coronary artery with other forms of angina pectoris: Secondary | ICD-10-CM

## 2021-06-30 DIAGNOSIS — N3010 Interstitial cystitis (chronic) without hematuria: Secondary | ICD-10-CM

## 2021-06-30 DIAGNOSIS — D803 Selective deficiency of immunoglobulin G [IgG] subclasses: Secondary | ICD-10-CM

## 2021-06-30 DIAGNOSIS — E1129 Type 2 diabetes mellitus with other diabetic kidney complication: Secondary | ICD-10-CM | POA: Insufficient documentation

## 2021-06-30 DIAGNOSIS — J3489 Other specified disorders of nose and nasal sinuses: Secondary | ICD-10-CM

## 2021-06-30 DIAGNOSIS — G4734 Idiopathic sleep related nonobstructive alveolar hypoventilation: Secondary | ICD-10-CM

## 2021-06-30 DIAGNOSIS — G43009 Migraine without aura, not intractable, without status migrainosus: Secondary | ICD-10-CM

## 2021-06-30 DIAGNOSIS — E1140 Type 2 diabetes mellitus with diabetic neuropathy, unspecified: Secondary | ICD-10-CM | POA: Diagnosis not present

## 2021-06-30 DIAGNOSIS — G4733 Obstructive sleep apnea (adult) (pediatric): Secondary | ICD-10-CM

## 2021-06-30 DIAGNOSIS — E1169 Type 2 diabetes mellitus with other specified complication: Secondary | ICD-10-CM

## 2021-06-30 DIAGNOSIS — I700 Atherosclerosis of aorta: Secondary | ICD-10-CM

## 2021-06-30 DIAGNOSIS — E1142 Type 2 diabetes mellitus with diabetic polyneuropathy: Secondary | ICD-10-CM

## 2021-06-30 DIAGNOSIS — J449 Chronic obstructive pulmonary disease, unspecified: Secondary | ICD-10-CM

## 2021-06-30 DIAGNOSIS — E039 Hypothyroidism, unspecified: Secondary | ICD-10-CM

## 2021-06-30 DIAGNOSIS — F250 Schizoaffective disorder, bipolar type: Secondary | ICD-10-CM

## 2021-06-30 DIAGNOSIS — E538 Deficiency of other specified B group vitamins: Secondary | ICD-10-CM

## 2021-06-30 MED ORDER — SUMATRIPTAN SUCCINATE 100 MG PO TABS: ORAL_TABLET | ORAL | 2 refills | Status: DC

## 2021-06-30 MED ORDER — FLUTICASONE PROPIONATE 50 MCG/ACT NA SUSP: 2.0000 | Freq: Every day | NASAL | 2 refills | Status: DC

## 2021-06-30 NOTE — Progress Notes (Signed)
Name: [name]   MRN: [id number]    DOB: 1966-07-02   Date:06/30/2021       Progress Note  Subjective  Chief Complaint  Follow Up  HPI  RLS: she states she has been on Gabapentin  for a long time. She has an urge to move it at night, both feet feels on fire , sometimes also jumps during the day but mostly at night.  It causes difficulty for her to fall asleep. He magnesium was low in the past and reminded her to take magnesium otc    Chronic bronchitis/hypoxemia/OSA  she states positive for OSA however unable to find a mask that fits her face and is only on nocturnal oxygen she states oxygen has helped . She also has chronic bronchitis ( second hand smoker) denies daily cough but has SOB with activity and and has frequent episodes of bronchitis. Uses 2 liters of oxyben at night.   GERD: seeing Dr. Allen Norris , indigestion resolved since medication was adjusted    DMII: seeing Dr. Ronnald Collum, A1C down from 10.6 % in January 2022 to  8.7 % a month later, 7.7 % on 02/03/2021 and last visit with Dr. Ronnald Collum 09/22 was down to 7.2 %   She has episodes of  polyphagia, polydipsia or polyuria. She is complaint with her medications. She is on multiple doses of insulin, Trulicity  and oral medication, we placed referral to endo but she states now that glucose is good she wants to stay with Dr. Ammie Dalton . She has obesity , dyslipidemia , CKI and neuropathy. She was reminded to have yearly eye exam.  She is on ACE, statin therapy and gabapentin    Hypothyroidism: managed by Dr. Ronnald Collum, she denies dysphagia, no dry skin, no change in bowel movements . Last TSH was low but she states Dr. Ronnald Collum told her to continue same dose    Schizoaffective disorder/Bipolar : current under the care of [facility]  , she is compliant with medications and visits. She states mood has been stable, denies any hypomania or mania, just feels tired, but denies suicidal thoughts or ideation   CAD/angina : echo  normal EF, she is on medical management, seeing Dr. Rockey Situ. 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 Lanora Manis , she has GFR monitored by Dr. Ronnald Collum and GFR usually around 40's she is on ARB and glucose is getting better controlled, good urine output and no pruritis   Atherosclerosis of aorta: continue medications, on statin therapy, fenofibrate  and zetia, LDL is at goal at 37.  Migraine headaches: episodes varies depending on stress, usually twice a week. She states Imitrex works well for her and needs a refill. She describes headaches as aching or tightness, has photophobia and phonophobia  She states she has associated nausea and vomiting.   Sinus pressure: started yesterday with post-nasal drainage and pain on frontal and left maxillary sinus, no fever or chills. Discussed trying nasal spray and she agreed   Patient Active Problem List   Diagnosis Date Noted   Controlled type 2 diabetes with neuropathy ([place]) 06/30/2021  Interstitial cystitis 06/30/2021   Atherosclerosis of aorta ([place]) 03/28/2021   Iron deficiency anemia due to chronic blood loss    Polyp of ascending colon    Vitamin B12 deficiency 08/08/2018   Patient is full code 10/30/2017   Coronary artery disease of native artery of native heart with stable angina pectoris ([place]) 10/09/2017   Plantar fascial fibromatosis 10/09/2017   Tendinitis of wrist 10/09/2017   Schizoaffective disorder, bipolar type ([place]) 09/28/2017   Adenomatous colon polyp 10/15/2015   Bipolar affective disorder ([place]) 10/15/2015   CN (constipation) 10/15/2015   HLD (hyperlipidemia) 10/15/2015    NASH (nonalcoholic steatohepatitis) 10/15/2015   Morbid obesity ([place]) 10/15/2015   Restless leg syndrome 10/15/2015   Low back pain with left-sided sciatica 08/18/2015   Apnea, sleep 05/05/2015   Heart valve disease 05/05/2015   Chronic kidney disease, stage III (moderate) ([place]) 03/15/2015   Migraine 03/12/2015   Chronic bronchitis ([place]) 01/20/2015   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[place]) 01/20/2015   GERD (gastroesophageal reflux disease) 01/20/2015   Hypothyroid    Vitamin D deficiency    Urinary incontinence    Liver lesion    Hypomagnesemia    Obstructive sleep apnea 10/20/2013   Allergic rhinitis 07/04/2007    Past Surgical History:  Procedure Laterality Date   ABDOMINAL HYSTERECTOMY     Partial   bladder stim  2007   BLADDER SURGERY     x 2. tacking of bladder and the other was the stimulator placement   CHOLECYSTECTOMY     COLONOSCOPY WITH PROPOFOL N/A 03/04/2019   Procedure: COLONOSCOPY WITH PROPOFOL;  Surgeon: [name], MD;  Location: [facility] ENDOSCOPY;  Service: Endoscopy;  Laterality: N/A;   ESOPHAGOGASTRODUODENOSCOPY (EGD) WITH PROPOFOL N/A 03/04/2019   Procedure: ESOPHAGOGASTRODUODENOSCOPY (EGD) WITH PROPOFOL;  Surgeon: [name], MD;  Location: [facility] ENDOSCOPY;  Service: Endoscopy;  Laterality: N/A;   INCISION AND DRAINAGE ABSCESS Right 04/16/2019   Procedure: INCISION AND DRAINAGE ABSCESS;  Surgeon: Benjamine Sprague, DO;  Location: [facility] ORS;  Service: General;  Laterality: Right;   INTERSTIM-GENERATOR CHANGE N/A 12/31/2017   Procedure: INTERSTIM-GENERATOR CHANGE;  Surgeon: Bjorn Loser, MD;  Location: [facility] ORS;  Service: Urology;  Laterality: N/A;   LIVER BIOPSY  March 2016   Negative   LUNG BIOPSY  2015   small spot on lung   TUBAL LIGATION     UPPER GI ENDOSCOPY     WRIST SURGERY Right 11/02/2019    Family History  Problem Relation Age of Onset   Diabetes Mother    Heart disease Mother    Hyperlipidemia Mother    Hypertension Mother    Kidney disease  Mother    Mental illness Mother    Hypothyroidism Mother    Stroke Mother    Osteoporosis Mother    Glaucoma Mother    Congestive Heart Failure Mother    Hypertension Father    Asthma Daughter    Cancer Daughter        throat   Bipolar disorder Daughter    Heart disease Maternal Grandfather    Rheum arthritis Maternal Grandfather    Cancer Maternal Grandfather        liver   Kidney disease Maternal Grandfather    Cancer Paternal Grandmother        lung   Kidney disease Brother    Breast cancer Maternal Grandmother 65   Bipolar disorder Daughter    Hypertension Daughter    Restless legs syndrome Daughter     Social History  Tobacco Use   Smoking status: Never   Smokeless tobacco: Never  Substance Use Topics   Alcohol use: No     Current Outpatient Medications:    atorvastatin (LIPITOR) 80 MG tablet, Take 80 mg by mouth at bedtime. , Disp: , Rfl:    busPIRone (BUSPAR) 30 MG tablet, Take 30 mg by mouth 2 (two) times daily., Disp: , Rfl:    citalopram (CELEXA) 20 MG tablet, Take 20 mg by mouth daily., Disp: , Rfl:    esomeprazole (NEXIUM) 40 MG capsule, Take 1 capsule (40 mg total) by mouth daily before breakfast., Disp: 30 capsule, Rfl: 3   ezetimibe (ZETIA) 10 MG tablet, Take 10 mg by mouth at bedtime., Disp: , Rfl:    fenofibrate micronized (LOFIBRA) 134 MG capsule, Take 134 mg by mouth at bedtime., Disp: , Rfl:    fluticasone (FLONASE) 50 MCG/ACT nasal spray, Place 2 sprays into both nostrils daily., Disp: 16 g, Rfl: 2   gabapentin (NEURONTIN) 300 MG capsule, Take 300 mg by mouth 2 (two) times daily., Disp: , Rfl:    gabapentin (NEURONTIN) 600 MG tablet, Take 600 mg by mouth at bedtime., Disp: , Rfl:    GLYXAMBI 25-5 MG TABS, Take 1 tablet by mouth daily., Disp: , Rfl:    HUMULIN R U-500 KWIKPEN 500 UNIT/ML kwikpen, Inject 70 Units into the skin 3 (three) times daily with meals., Disp: , Rfl:    insulin aspart (NOVOLOG FLEXPEN) 100 UNIT/ML FlexPen, Inject 3 Units  subcutaneously. Sliding scale., Disp: , Rfl:    lamoTRIgine (LAMICTAL) 200 MG tablet, Take 200 mg by mouth 2 (two) times daily. , Disp: , Rfl:    lisinopril (ZESTRIL) 2.5 MG tablet, Take 1 tablet (2.5 mg total) by mouth daily., Disp: 90 tablet, Rfl: 0   nitroGLYCERIN (NITROSTAT) 0.4 MG SL tablet, Place 1 tablet (0.4 mg total) under the tongue every 5 (five) minutes as needed for chest pain., Disp: 25 tablet, Rfl: 0   OneTouch Delica Lancets 29U MISC, by Does not apply route., Disp: , Rfl:    promethazine (PHENERGAN) 12.5 MG tablet, TAKE 1 TABLET BY MOUTH EVERY 6 HOURS AS NEEDED FOR NAUSEA OR VOMITING, Disp: 30 tablet, Rfl: 1   QUEtiapine (SEROQUEL) 200 MG tablet, Take 200 mg by mouth at bedtime. Along with 352m dose for a total of 506m Disp: , Rfl:    QUEtiapine (SEROQUEL) 300 MG tablet, Take 300 mg by mouth at bedtime. Along with 20021mose for a total of 500m16misp: , Rfl:    SYNTHROID 100 MCG tablet, Take 100 mcg by mouth daily., Disp: , Rfl:    TRULICITY 1.5 MG/0TM/5.4YTN, Inject 1.5 mg into the skin once a week. (Wednesdays), Disp: , Rfl:    metoprolol tartrate (LOPRESSOR) 25 MG tablet, Take 1 tablet (25 mg total) by mouth 2 (two) times daily., Disp: 180 tablet, Rfl: 1   SUMAtriptan (IMITREX) 100 MG tablet, TAKE 1 TABLET BY MOUTH ONCE FOR 1 DOSE. MAY REPEAT IN 2 HOURS IF HEADACHE PERSISTS OR RECURS., Disp: 10 tablet, Rfl: 2  Allergies  Allergen Reactions   Mirtazapine Anaphylaxis    REACTION: closes throat   Morphine And Related     anaphylaxis   Sulfamethoxazole-Trimethoprim Rash    REACTION: [place] all over   Vicodin Hp [Hydrocodone-Acetaminophen]    Amoxicillin Er Nausea And Vomiting    Pt doesn't remember problem with amoxicillin -  SE Has patient had a PCN reaction causing immediate rash, facial/tongue/throat swelling, SOB or lightheadedness  with hypotension: No Has patient had a PCN reaction causing severe rash involving mucus membranes or skin necrosis: No Has patient had a  PCN reaction that required hospitalization: No Has patient had a PCN reaction occurring within the last 10 years: Yes If all of the above answers are "NO", then may proceed with Cephalosporin use.   Codeine Itching    REACTION: itch, rash   Hydrocodone-Acetaminophen Rash    REACTION: rash   Keflex [Cephalexin] Rash    Pt does not remember having a reaction or rash, no airway involvement   Prasterone Nausea And Vomiting    I personally reviewed active problem list, medication list, allergies, family history, social history, health maintenance with the patient/caregiver today.   ROS  Constitutional: Negative for fever, positive for  weight change.  Respiratory: Negative for cough , positive for shortness of breath.   Cardiovascular: Negative for chest pain or palpitations.  Gastrointestinal: Negative for abdominal pain, no bowel changes.  Musculoskeletal: Negative for gait problem or joint swelling.  Skin: Negative for rash.  Neurological: Negative for dizziness , positive for intermittent  headache.  No other specific complaints in a complete review of systems (except as listed in HPI above).   Objective  Vitals:   06/30/21 1030  BP: 128/82  Pulse: 92  Resp: 16  Temp: 98.1 F (36.7 C)  TempSrc: Oral  SpO2: 95%  Weight: 199 lb 1.6 oz (90.3 kg)  Height: 4' 11"  (1.499 m)    Body mass index is 40.21 kg/m.  Physical Exam  Constitutional: Patient appears well-developed and well-nourished. Obese  No distress.  HEENT: head atraumatic, normocephalic, pupils equal and reactive to light, neck supple Cardiovascular: Normal rate, regular rhythm and normal heart sounds.  No murmur heard. Trace  BLE edema. Pulmonary/Chest: Effort normal and breath sounds normal. No respiratory distress. Abdominal: Soft.  There is no tenderness. Psychiatric: Patient has a normal mood and affect. behavior is normal. Judgment and thought content normal.   Recent Results (from the past 2160 hour(s))   POCT urinalysis dipstick     Status: Abnormal   Collection Time: 04/18/21 10:25 AM  Result Value Ref Range   Color, UA yellow    Clarity, UA clear    Glucose, UA Positive (A) Negative   Bilirubin, UA negative    Ketones, UA negative    Spec Grav, UA 1.020 1.010 - 1.025   Blood, UA trace    pH, UA 6.0 5.0 - 8.0   Protein, UA Positive (A) Negative   Urobilinogen, UA 0.2 0.2 or 1.0 E.U./dL   Nitrite, UA negative    Leukocytes, UA Small (1+) (A) Negative   Appearance clear    Odor none   Urine Culture     Status: None   Collection Time: 04/18/21 10:38 AM   Specimen: Urine  Result Value Ref Range   MICRO NUMBER: [id number]    SPECIMEN QUALITY: Adequate    Sample Source URINE    STATUS: FINAL    Result: No Growth   Iron, TIBC and Ferritin Panel     Status: None   Collection Time: 06/18/21 12:00 AM  Result Value Ref Range   Ferritin 46    Iron 78   CBC and differential     Status: Abnormal   Collection Time: 06/18/21 12:00 AM  Result Value Ref Range   Hemoglobin 11.7 (A) 12.0 - 16.0   HCT 38 36 - 46   Neutrophils Absolute 3.30    Platelets 277 150 -  399   WBC 6.5   CBC     Status: None   Collection Time: 06/18/21 12:00 AM  Result Value Ref Range   RBC 4.16 3.87 - 5.46  Basic metabolic panel     Status: Abnormal   Collection Time: 06/18/21 12:00 AM  Result Value Ref Range   Glucose 206    BUN 25 (A) 4 - 21   Creatinine 1.1 0.5 - 1.1   Potassium 4.7 3.4 - 5.3   Sodium 142 137 - 147   Chloride 104 99 - 108  Comprehensive metabolic panel     Status: None   Collection Time: 06/18/21 12:00 AM  Result Value Ref Range   Globulin 1.9    GFR calc Af Amer 61    Calcium 10.1 8.7 - 10.7   Albumin 4.2 3.5 - 5.0  Lipid panel     Status: Abnormal   Collection Time: 06/18/21 12:00 AM  Result Value Ref Range   LDl/HDL Ratio 3.2    Triglycerides 179 (A) 40 - 160   Cholesterol 100 0 - 200   HDL 31 (A) 35 - 70   LDL Cholesterol 39   Hepatic function panel     Status: None    Collection Time: 06/18/21 12:00 AM  Result Value Ref Range   Alkaline Phosphatase 48 25 - 125   ALT 26 7 - 35   AST 32 13 - 35   Bilirubin, Total 0.2   Vitamin B12     Status: None   Collection Time: 06/18/21 12:00 AM  Result Value Ref Range   Vitamin B-12 359   Hemoglobin A1c     Status: None   Collection Time: 06/18/21 12:00 AM  Result Value Ref Range   Hemoglobin A1C 7.2   TSH     Status: Abnormal   Collection Time: 06/18/21 12:00 AM  Result Value Ref Range   TSH 0.07 (A) 0.41 - 5.90      PHQ2/9: Depression screen [facility] 2/9 06/30/2021 05/19/2021 04/18/2021 03/28/2021 03/10/2021  Decreased Interest 2 1 0 2 2  Down, Depressed, Hopeless 1 0 0 1 0  PHQ - 2 Score 3 1 0 3 2  Altered sleeping 1 0 0 0 0  Tired, decreased energy 2 2 0 2 0  Change in appetite 3 3 0 0 0  Feeling bad or failure about yourself  1 0 0 0 0  Trouble concentrating 2 2 0 0 0  Moving slowly or fidgety/restless 1 1 0 0 0  Suicidal thoughts 0 0 0 0 0  PHQ-9 Score 13 9 0 5 2  Difficult doing work/chores Somewhat difficult Somewhat difficult Not difficult at all - Not difficult at all  Some recent data might be hidden    phq 9 is positive   Fall Risk: Fall Risk  06/30/2021 05/19/2021 04/18/2021 03/28/2021 03/10/2021  Falls in the past year? 1 1 0 1 0  Comment - - - - -  Number falls in past yr: 1 0 0 0 0  Comment - - - - -  Injury with Fall? 0 0 0 0 0  Comment - - - - -  Risk for fall due to : History of fall(s) History of fall(s) - - -  Follow up Falls prevention discussed Falls prevention discussed Falls evaluation completed - Falls evaluation completed      Functional Status Survey: Is the patient deaf or have difficulty hearing?: No Does the patient have difficulty seeing, even  when wearing glasses/contacts?: No Does the patient have difficulty concentrating, remembering, or making decisions?: Yes Does the patient have difficulty walking or climbing stairs?: No Does the patient have difficulty  dressing or bathing?: No Does the patient have difficulty doing errands alone such as visiting a doctor's office or shopping?: No    Assessment & Plan  1. Controlled type 2 diabetes with neuropathy ([place])   2. Atherosclerosis of aorta (H[facility])  On statin therapy, LDL at goal   3. Coronary artery disease of native artery of native heart with stable angina pectoris ([facility])  Stable on medications   4. Diabetic polyneuropathy associated with type 2 diabetes mellitus ([place])  On high dose gabapentin   5. OSA (obstructive sleep apnea)  On nocturnal oxygen only   6. Simple chronic bronchitis (H[facility])  Stable, not on medication   7. Nocturnal hypoxia (H[facility])   8. Schizoaffective disorder, bipolar type ([place])   9. 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[place])  Lost to follow up with [facility]  10. Interstitial cystitis   11. Morbid obesity ([place])  Discussed with the patient the risk posed by an increased BMI. Discussed importance of portion control, calorie counting and at least 150 minutes of physical activity weekly. Avoid sweet beverages and drink more water. Eat at least 6 servings of fruit and vegetables daily    12. Restless leg syndrome   13. Vitamin B12 deficiency   14. Adult hypothyroidism  Managed by Dr. Ronnald Collum  15. Migraine without aura and without status migrainosus, not intractable  - SUMAtriptan (IMITREX) 100 MG tablet; TAKE 1 TABLET BY MOUTH ONCE FOR 1 DOSE. MAY REPEAT IN 2 HOURS IF HEADACHE PERSISTS OR RECURS.  Dispense: 10 tablet; Refill: 2  16. Sinus pressure  - fluticasone (FLONASE) 50 MCG/ACT nasal spray; Place 2 sprays into both nostrils daily.  Dispense: 16 g; Refill: 2

## 2021-06-30 NOTE — Chronic Care Management (AMB) (Signed)
Chronic Care Management   CCM RN Visit Note  06/30/2021 Name: [name] MRN: [id number] DOB: [birth year]  Subjective: [name] is a 55 y.o. year old female who is a primary care patient of Steele Sizer, MD. The care management team was consulted for assistance with disease management and care coordination needs.    Engaged with patient by telephone for follow up visit in response to provider referral for case management and care coordination services.   Consent to Services:  The patient was given information about Chronic Care Management services, agreed to services, and gave verbal consent prior to initiation of services.  Please see initial visit note for detailed documentation.   Assessment: Review of patient past medical history, allergies, medications, health status, including review of consultants reports, laboratory and other test data, was performed as part of comprehensive evaluation and provision of chronic care management services.   SDOH (Social Determinants of Health) assessments and interventions performed: No  CCM Care Plan  Allergies  Allergen Reactions   Mirtazapine Anaphylaxis    REACTION: closes throat   Morphine And Related     anaphylaxis   Sulfamethoxazole-Trimethoprim Rash    REACTION: Whelps all over   Vicodin Hp [Hydrocodone-Acetaminophen]    Amoxicillin Er Nausea And Vomiting    Pt doesn't remember problem with amoxicillin -  SE Has patient had a PCN reaction causing immediate rash, facial/tongue/throat swelling, SOB or lightheadedness with hypotension: No Has patient had a PCN reaction causing severe rash involving mucus membranes or skin necrosis: No Has patient had a PCN reaction that required hospitalization: No Has patient had a PCN reaction occurring within the last 10 years: Yes If all of the above answers are "NO", then may proceed with Cephalosporin use.   Codeine Itching    REACTION: itch, rash   Hydrocodone-Acetaminophen Rash     REACTION: rash   Keflex [Cephalexin] Rash    Pt does not remember having a reaction or rash, no airway involvement   Prasterone Nausea And Vomiting    Outpatient Encounter Medications as of 06/30/2021  Medication Sig   atorvastatin (LIPITOR) 80 MG tablet Take 80 mg by mouth at bedtime.    busPIRone (BUSPAR) 30 MG tablet Take 30 mg by mouth 2 (two) times daily.   citalopram (CELEXA) 20 MG tablet Take 20 mg by mouth daily.   esomeprazole (NEXIUM) 40 MG capsule Take 1 capsule (40 mg total) by mouth daily before breakfast.   ezetimibe (ZETIA) 10 MG tablet Take 10 mg by mouth at bedtime.   fenofibrate micronized (LOFIBRA) 134 MG capsule Take 134 mg by mouth at bedtime.   fluticasone (FLONASE) 50 MCG/ACT nasal spray Place 2 sprays into both nostrils daily.   gabapentin (NEURONTIN) 300 MG capsule Take 300 mg by mouth 2 (two) times daily.   gabapentin (NEURONTIN) 600 MG tablet Take 600 mg by mouth at bedtime.   GLYXAMBI 25-5 MG TABS Take 1 tablet by mouth daily.   HUMULIN R U-500 KWIKPEN 500 UNIT/ML kwikpen Inject 70 Units into the skin 3 (three) times daily with meals.   insulin aspart (NOVOLOG FLEXPEN) 100 UNIT/ML FlexPen Inject 3 Units subcutaneously. Sliding scale.   lamoTRIgine (LAMICTAL) 200 MG tablet Take 200 mg by mouth 2 (two) times daily.    lisinopril (ZESTRIL) 2.5 MG tablet Take 1 tablet (2.5 mg total) by mouth daily.   metoprolol tartrate (LOPRESSOR) 25 MG tablet Take 1 tablet (25 mg total) by mouth 2 (two) times daily.   nitroGLYCERIN (NITROSTAT)  0.4 MG SL tablet Place 1 tablet (0.4 mg total) under the tongue every 5 (five) minutes as needed for chest pain.   OneTouch Delica Lancets 16X MISC by Does not apply route.   promethazine (PHENERGAN) 12.5 MG tablet TAKE 1 TABLET BY MOUTH EVERY 6 HOURS AS NEEDED FOR NAUSEA OR VOMITING   QUEtiapine (SEROQUEL) 200 MG tablet Take 200 mg by mouth at bedtime. Along with 316m dose for a total of 5024m  QUEtiapine (SEROQUEL) 300 MG tablet Take 300  mg by mouth at bedtime. Along with 20066mose for a total of 500m33mSUMAtriptan (IMITREX) 100 MG tablet TAKE 1 TABLET BY MOUTH ONCE FOR 1 DOSE. MAY REPEAT IN 2 HOURS IF HEADACHE PERSISTS OR RECURS.   SYNTHROID 100 MCG tablet Take 100 mcg by mouth daily.   TRULICITY 1.5 MG/0WR/6.0AVN Inject 1.5 mg into the skin once a week. (Wednesdays)   No facility-administered encounter medications on file as of 06/30/2021.    Patient Active Problem List   Diagnosis Date Noted   Controlled type 2 diabetes with neuropathy (HCC)[place]/27/2022   Interstitial cystitis 06/30/2021   Atherosclerosis of aorta (HCC)[place]/25/2022   Iron deficiency anemia due to chronic blood loss    Polyp of ascending colon    Vitamin B12 deficiency 08/08/2018   Patient is full code 10/30/2017   Coronary artery disease of native artery of native heart with stable angina pectoris (HCC)[place]/01/2018   Plantar fascial fibromatosis 10/09/2017   Tendinitis of wrist 10/09/2017   Schizoaffective disorder, bipolar type (HCC)[place]/25/2019   Adenomatous colon polyp 10/15/2015   Bipolar affective disorder (HCC)[place]/06/2016   CN (constipation) 10/15/2015   HLD (hyperlipidemia) 10/15/2015   NASH (nonalcoholic steatohepatitis) 10/15/2015   Morbid obesity (HCC)[place]/06/2016   Restless leg syndrome 10/15/2015   Low back pain with left-sided sciatica 08/18/2015   Apnea, sleep 05/05/2015   Heart valve disease 05/05/2015   Chronic kidney disease, stage III (moderate) (HCC)[place]/07/2015   Migraine 03/12/2015   Chronic bronchitis (HCC)[place]/18/2016   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HCC)[place]/18/2016   GERD (gastroesophageal reflux disease) 01/20/2015   Hypothyroid    Vitamin D deficiency    Urinary incontinence    Liver lesion    Hypomagnesemia    Obstructive sleep apnea 10/20/2013   Allergic rhinitis 07/04/2007    Conditions to be addressed/monitored:CHF, COPD and DMII  Patient Care Plan: Heart Failure (Adult)     Problem Identified: Disease  Progression (Heart Failure)      Long-Range Goal: Symptom Exacerbation Prevented or Minimized Completed 06/30/2021  Start Date: 01/27/2021  Expected End Date: 05/27/2021  Priority: High  Note:    Current Barriers:  Chronic Disease Management support and educational needs r/t CHF.  Case Manager Clinical Goal(s):  Over the next 120 days, patient will not require hospitalization or emergent care d/t complications r/t CHF exacerbation.   Interventions:  Collaboration with SowlSteele Sizer regarding development and update of comprehensive plan of care as evidenced by provider attestation and co-signature Inter-disciplinary care team collaboration (see longitudinal plan of care) Reviewed plan for CHF self-management. Reports taking medications as prescribed. Reports weights have been stable. Denies increased abdominal or lower extremity edema. Denies episodes of shortness of breath. No chest discomfort or decline in activity tolerance. Reports dietary intake and activity level have improved. Advised to continue monitoring sodium intake. Reviewed s/sx of fluid overload and indications for notifying a provider.  Reviewed worsening s/sx related to CHF exacerbation and indications  for seeking immediate medical attention.   Patient Goals/Self-Care Activities:  Continue taking medications as prescribed Continue monitoring weight and record readings Adhere to recommended cardiac prudent/heart healthy diet Contact provider or care management team with questions and new concerns as needed.     Patient Care Plan: COPD (Adult)     Problem Identified: Symptom Exacerbation (COPD)      Long-Range Goal: Symptom Exacerbation Prevented or Minimized Completed 06/30/2021  Start Date: 01/27/2021  Expected End Date: 05/27/2021  Priority: High  Note:    Current Barriers:  Chronic Disease Management support and educational needs r/t COPD  Case Manager Clinical Goal(s): Over the next 120 days,  patient will not require hospitalization for complications r/t COPD exacerbation   Interventions:  Collaboration with Steele Sizer, MD regarding development and update of comprehensive plan of care as evidenced by provider attestation and co-signature Inter-disciplinary care team collaboration (see longitudinal plan of care) Reviewed compliance with current treatment plan. Reports compliance with treatment recommendations. Reports symptoms remain well controlled. Denies worsening symptoms with season changes.  Discussed importance of continuing daily self assessment. Reviewed importance of notifying a provider if experiencing moderate symptoms for 48 hours without improvement. Reviewed information regarding infection prevention and increased risk r/t COPD.   Reviewed worsening symptoms that require immediate medical attention.    Self-Care Activities/Patient Goals: Take medications and utilize inhalers as prescribed Assess symptoms daily and notify provider if symptoms persist.  Follow recommendations to prevent respiratory infection Notify provider or care management team with questions and new concerns as needed      Patient Care Plan: Diabetes Type 2 (Adult)     Problem Identified: Disease Progression (Diabetes, Type 2)      Long-Range Goal: Disease Progression Prevented or Minimized   Start Date: 06/30/2021  Expected End Date: 09/28/2021  Priority: High  Note:    Current Barriers:  Chronic disease management support and educational needs related to Diabetes self-management   Case Manager Clinical Goal(s):  Over the next 90 days, patient will demonstrate improved adherence to prescribed treatment plan for Diabetes management as evidenced by taking medications as prescribed, monitoring and recording CBGs, and adherence to an ADA/ carb modified diet.   Interventions:  Collaboration with Steele Sizer, MD regarding development and update of comprehensive plan of care as  evidenced by provider attestation and co-signature Inter-disciplinary care team collaboration (see longitudinal plan of care) Reviewed medications blood glucose readings and compliance with treatment plan. Reports excellent compliance with medications. Reports improvements with dietary intake.  Discussed blood glucose readings. Reports fasting readings have been within range. Current A1C is 7.7% Advised to continue monitoring carb intake. Advised to continue reading nutrition labels and avoid concentrated sugars when possible. She is very motivated to improve her overall glycemic control.     Patient Goals/Self-Care Activities Self-administer medications as prescribed Utilize sliding scale insulin for elevated readings as prescribed Attend all scheduled provider appointments Monitor blood glucose levels consistently and utilize recommended interventions Adhere to prescribed ADA/carb modified Notify provider or care management team with questions and new concerns as needed   Follow Up Plan:  Will follow up in three months      PLAN A member of the care management team will follow up in three months.   [facility] Care Management [facility] [phone]

## 2021-06-30 NOTE — Patient Instructions (Addendum)
Thank you for allowing the Chronic Care Management team to participate in your care.    Patient Care Plan: Heart Failure (Adult)     Problem Identified: Disease Progression (Heart Failure)      Long-Range Goal: Symptom Exacerbation Prevented or Minimized Completed 06/30/2021  Start Date: 01/27/2021  Expected End Date: 05/27/2021  Priority: High  Note:    Current Barriers:  Chronic Disease Management support and educational needs r/t CHF.  Case Manager Clinical Goal(s):  Over the next 120 days, patient will not require hospitalization or emergent care d/t complications r/t CHF exacerbation.   Interventions:  Collaboration with Steele Sizer, MD regarding development and update of comprehensive plan of care as evidenced by provider attestation and co-signature Inter-disciplinary care team collaboration (see longitudinal plan of care) Reviewed plan for CHF self-management. Reports taking medications as prescribed. Reports weights have been stable. Denies increased abdominal or lower extremity edema. Denies episodes of shortness of breath. No chest discomfort or decline in activity tolerance. Reports dietary intake and activity level have improved. Advised to continue monitoring sodium intake. Reviewed s/sx of fluid overload and indications for notifying a provider.  Reviewed worsening s/sx related to CHF exacerbation and indications for seeking immediate medical attention.   Patient Goals/Self-Care Activities:  Continue taking medications as prescribed Continue monitoring weight and record readings Adhere to recommended cardiac prudent/heart healthy diet Contact provider or care management team with questions and new concerns as needed.     Patient Care Plan: COPD (Adult)     Problem Identified: Symptom Exacerbation (COPD)      Long-Range Goal: Symptom Exacerbation Prevented or Minimized Completed 06/30/2021  Start Date: 01/27/2021  Expected End Date: 05/27/2021  Priority:  High  Note:    Current Barriers:  Chronic Disease Management support and educational needs r/t COPD  Case Manager Clinical Goal(s): Over the next 120 days, patient will not require hospitalization for complications r/t COPD exacerbation   Interventions:  Collaboration with Steele Sizer, MD regarding development and update of comprehensive plan of care as evidenced by provider attestation and co-signature Inter-disciplinary care team collaboration (see longitudinal plan of care) Reviewed compliance with current treatment plan. Reports compliance with treatment recommendations. Reports symptoms remain well controlled. Denies worsening symptoms with season changes.  Discussed importance of continuing daily self assessment. Reviewed importance of notifying a provider if experiencing moderate symptoms for 48 hours without improvement. Reviewed information regarding infection prevention and increased risk r/t COPD.   Reviewed worsening symptoms that require immediate medical attention.    Self-Care Activities/Patient Goals: Take medications and utilize inhalers as prescribed Assess symptoms daily and notify provider if symptoms persist.  Follow recommendations to prevent respiratory infection Notify provider or care management team with questions and new concerns as needed      Patient Care Plan: Diabetes Type 2 (Adult)     Problem Identified: Disease Progression (Diabetes, Type 2)      Long-Range Goal: Disease Progression Prevented or Minimized   Start Date: 06/30/2021  Expected End Date: 09/28/2021  Priority: High  Note:    Current Barriers:  Chronic disease management support and educational needs related to Diabetes self-management   Case Manager Clinical Goal(s):  Over the next 90 days, patient will demonstrate improved adherence to prescribed treatment plan for Diabetes management as evidenced by taking medications as prescribed, monitoring and recording CBGs, and  adherence to an ADA/ carb modified diet.   Interventions:  Collaboration with Steele Sizer, MD regarding development and update of comprehensive plan of care as  evidenced by provider attestation and co-signature Inter-disciplinary care team collaboration (see longitudinal plan of care) Reviewed medications blood glucose readings and compliance with treatment plan. Reports excellent compliance with medications. Reports improvements with dietary intake.  Discussed blood glucose readings. Reports fasting readings have been within range. Current A1C is 7.7% Advised to continue monitoring carb intake. Advised to continue reading nutrition labels and avoid concentrated sugars when possible. She is very motivated to improve her overall glycemic control.     Patient Goals/Self-Care Activities Self-administer medications as prescribed Utilize sliding scale insulin for elevated readings as prescribed Attend all scheduled provider appointments Monitor blood glucose levels consistently and utilize recommended interventions Adhere to prescribed ADA/carb modified Notify provider or care management team with questions and new concerns as needed   Follow Up Plan:  Will follow up in three months       Ms. Nawabi verbalized understanding of the information discussed during the telephonic outreach. Declined need for mailed/printed instructions. A member of the care management team will follow up in three months.   [facility] Care Management [facility] [phone]

## 2021-07-04 DIAGNOSIS — E669 Obesity, unspecified: Secondary | ICD-10-CM | POA: Diagnosis not present

## 2021-07-04 DIAGNOSIS — E1169 Type 2 diabetes mellitus with other specified complication: Secondary | ICD-10-CM | POA: Diagnosis not present

## 2021-07-04 DIAGNOSIS — J449 Chronic obstructive pulmonary disease, unspecified: Secondary | ICD-10-CM | POA: Diagnosis not present

## 2021-07-27 ENCOUNTER — Other Ambulatory Visit: Payer: Self-pay | Admitting: *Deleted

## 2021-07-27 MED ORDER — METOPROLOL TARTRATE 25 MG PO TABS: 25.0000 mg | ORAL_TABLET | Freq: Two times a day (BID) | ORAL | 0 refills | Status: DC

## 2021-08-01 ENCOUNTER — Ambulatory Visit: Payer: Medicare Other | Admitting: Urology

## 2021-08-19 ENCOUNTER — Other Ambulatory Visit: Payer: Self-pay

## 2021-08-19 MED ORDER — LISINOPRIL 2.5 MG PO TABS: 2.5000 mg | ORAL_TABLET | Freq: Every day | ORAL | 0 refills | Status: DC

## 2021-08-22 ENCOUNTER — Ambulatory Visit: Payer: Medicare Other

## 2021-08-31 ENCOUNTER — Other Ambulatory Visit: Payer: Self-pay | Admitting: Family Medicine

## 2021-08-31 DIAGNOSIS — G43009 Migraine without aura, not intractable, without status migrainosus: Secondary | ICD-10-CM

## 2021-09-04 DIAGNOSIS — G459 Transient cerebral ischemic attack, unspecified: Secondary | ICD-10-CM

## 2021-09-04 HISTORY — DX: Transient cerebral ischemic attack, unspecified: G45.9

## 2021-09-08 ENCOUNTER — Ambulatory Visit: Payer: Medicare Other | Admitting: Nurse Practitioner

## 2021-09-08 ENCOUNTER — Other Ambulatory Visit: Payer: Self-pay

## 2021-09-08 ENCOUNTER — Encounter: Payer: Self-pay | Admitting: Nurse Practitioner

## 2021-09-08 VITALS — BP 126/76 | HR 84 | Temp 98.2°F | Resp 16 | Ht 59.0 in | Wt 201.9 lb

## 2021-09-08 DIAGNOSIS — J0140 Acute pansinusitis, unspecified: Secondary | ICD-10-CM

## 2021-09-08 DIAGNOSIS — H9192 Unspecified hearing loss, left ear: Secondary | ICD-10-CM

## 2021-09-08 DIAGNOSIS — H6592 Unspecified nonsuppurative otitis media, left ear: Secondary | ICD-10-CM | POA: Diagnosis not present

## 2021-09-08 MED ORDER — DOXYCYCLINE HYCLATE 100 MG PO TABS: 100.0000 mg | ORAL_TABLET | Freq: Two times a day (BID) | ORAL | 0 refills | Status: AC

## 2021-09-08 NOTE — Progress Notes (Signed)
BP 126/76    Pulse 84    Temp 98.2 F (36.8 C) (Oral)    Resp 16    Ht 4' 11"  (1.499 m)    Wt 201 lb 14.4 oz (91.6 kg)    SpO2 96%    BMI 40.78 kg/m    Subjective:    Patient ID: [name], female    DOB: Jan 12, 1966, 56 y.o.   MRN: [id number]  HPI: [name] is a 56 y.o. female, here alone  Chief Complaint  Patient presents with   Ear Pain    Mainly left ear but feels discomfort right underneath the ear. Pt states can not hear from both ears. Also having some pressure near nose and forehead   Sinus infection:  She says that around Christmas she had a cold.  She says she was coughing, nasal congestion and headache.  She says she is doing better except now she has facial pressure, pain and cannot hear out of her left ear.  She says she is still taking Flonase.  Discussed OTC treatments she can take to help with symptoms.  Will send in antibiotics and if no improvement with hearing will send to ENT.  Relevant past medical, surgical, family and social history reviewed and updated as indicated. Interim medical history since our last visit reviewed. Allergies and medications reviewed and updated.  Review of Systems  Constitutional: Negative for fever or weight change.  HEENT: positive for nasal congestion, loss of hearing in left ear, facial pressure and pain Respiratory: Positive for cough and negative for shortness of breath.   Cardiovascular: Negative for chest pain or palpitations.  Gastrointestinal: Negative for abdominal pain, no bowel changes.  Musculoskeletal: Negative for gait problem or joint swelling.  Skin: Negative for rash.  Neurological: Negative for dizziness or headache.  No other specific complaints in a complete review of systems (except as listed in HPI above).      Objective:    BP 126/76    Pulse 84    Temp 98.2 F (36.8 C) (Oral)    Resp 16    Ht 4' 11"  (1.499 m)    Wt 201 lb 14.4 oz (91.6 kg)    SpO2 96%    BMI 40.78 kg/m   Wt Readings from Last 3  Encounters:  09/08/21 201 lb 14.4 oz (91.6 kg)  06/30/21 199 lb 1.6 oz (90.3 kg)  05/19/21 192 lb 9.6 oz (87.4 kg)    Physical Exam  Constitutional: Patient appears well-developed and well-nourished. Obese No distress.  HEENT: head atraumatic, normocephalic, pupils equal and reactive to light, ears right TM clear, left TM effusion, neck supple, throat within normal limits, facial tenderness of frontal and maxillary sinus Cardiovascular: Normal rate, regular rhythm and normal heart sounds.  No murmur heard. No BLE edema. Pulmonary/Chest: Effort normal and breath sounds normal. No respiratory distress. Abdominal: Soft.  There is no tenderness. Psychiatric: Patient has a normal mood and affect. behavior is normal. Judgment and thought content normal.   Results for orders placed or performed in visit on 06/28/21  Iron, TIBC and Ferritin Panel  Result Value Ref Range   Ferritin 46    Iron 78   CBC and differential  Result Value Ref Range   Hemoglobin 11.7 (A) 12.0 - 16.0   HCT 38 36 - 46   Neutrophils Absolute 3.30    Platelets 277 150 - 399   WBC 6.5   CBC  Result Value Ref Range  RBC 4.16 3.87 - 7.74  Basic metabolic panel  Result Value Ref Range   Glucose 206    BUN 25 (A) 4 - 21   Creatinine 1.1 0.5 - 1.1   Potassium 4.7 3.4 - 5.3   Sodium 142 137 - 147   Chloride 104 99 - 108  Comprehensive metabolic panel  Result Value Ref Range   Globulin 1.9    GFR calc Af Amer 61    Calcium 10.1 8.7 - 10.7   Albumin 4.2 3.5 - 5.0  Lipid panel  Result Value Ref Range   LDl/HDL Ratio 3.2    Triglycerides 179 (A) 40 - 160   Cholesterol 100 0 - 200   HDL 31 (A) 35 - 70   LDL Cholesterol 39   Hepatic function panel  Result Value Ref Range   Alkaline Phosphatase 48 25 - 125   ALT 26 7 - 35   AST 32 13 - 35   Bilirubin, Total 0.2   Vitamin B12  Result Value Ref Range   Vitamin B-12 359   Hemoglobin A1c  Result Value Ref Range   Hemoglobin A1C 7.2   TSH  Result Value Ref  Range   TSH 0.07 (A) 0.41 - 5.90      Assessment & Plan:   1. Acute non-recurrent pansinusitis - discussed OTC treatments for symptoms - doxycycline (VIBRA-TABS) 100 MG tablet; Take 1 tablet (100 mg total) by mouth 2 (two) times daily for 10 days.  Dispense: 20 tablet; Refill: 0  2. Middle ear effusion, left  - discussed OTC treatments for symptoms  3. Hearing loss of left ear, unspecified hearing loss type -will refer to ENT if no improvement  Follow up plan: Return if symptoms worsen or fail to improve.

## 2021-09-12 DIAGNOSIS — F250 Schizoaffective disorder, bipolar type: Secondary | ICD-10-CM | POA: Diagnosis not present

## 2021-09-16 DIAGNOSIS — I10 Essential (primary) hypertension: Secondary | ICD-10-CM | POA: Diagnosis not present

## 2021-09-16 DIAGNOSIS — E1165 Type 2 diabetes mellitus with hyperglycemia: Secondary | ICD-10-CM | POA: Diagnosis not present

## 2021-09-16 DIAGNOSIS — E039 Hypothyroidism, unspecified: Secondary | ICD-10-CM | POA: Diagnosis not present

## 2021-09-16 DIAGNOSIS — E7800 Pure hypercholesterolemia, unspecified: Secondary | ICD-10-CM | POA: Diagnosis not present

## 2021-09-17 LAB — HEPATIC FUNCTION PANEL
ALT: 40 — AB (ref 7–35)
AST: 49 — AB (ref 13–35)
Alkaline Phosphatase: 64 (ref 25–125)
Bilirubin, Total: 0.3

## 2021-09-17 LAB — BASIC METABOLIC PANEL
BUN: 24 — AB (ref 4–21)
CO2: 23 — AB (ref 13–22)
Chloride: 104 (ref 99–108)
Creatinine: 1.1 (ref 0.5–1.1)
Glucose: 237
Potassium: 4.4 (ref 3.4–5.3)
Sodium: 143 (ref 137–147)

## 2021-09-17 LAB — TSH: TSH: 0.04 — AB (ref 0.41–5.90)

## 2021-09-17 LAB — COMPREHENSIVE METABOLIC PANEL
Albumin: 3.6 (ref 3.5–5.0)
Calcium: 9 (ref 8.7–10.7)
GFR calc non Af Amer: 62
Globulin: 1.9

## 2021-09-17 LAB — HEMOGLOBIN A1C: Hemoglobin A1C: 7.7

## 2021-09-17 LAB — LIPID PANEL
Cholesterol: 104 (ref 0–200)
HDL: 32 — AB (ref 35–70)
LDL Cholesterol: 49
LDl/HDL Ratio: 1.5
Triglycerides: 128 (ref 40–160)

## 2021-09-19 ENCOUNTER — Telehealth: Payer: Self-pay

## 2021-09-19 ENCOUNTER — Encounter: Payer: Self-pay | Admitting: Urology

## 2021-09-19 ENCOUNTER — Ambulatory Visit (INDEPENDENT_AMBULATORY_CARE_PROVIDER_SITE_OTHER): Payer: Medicare Other | Admitting: Urology

## 2021-09-19 ENCOUNTER — Other Ambulatory Visit: Payer: Self-pay

## 2021-09-19 VITALS — BP 137/77 | HR 87 | Ht 59.0 in | Wt 195.0 lb

## 2021-09-19 DIAGNOSIS — N3946 Mixed incontinence: Secondary | ICD-10-CM

## 2021-09-19 DIAGNOSIS — R32 Unspecified urinary incontinence: Secondary | ICD-10-CM

## 2021-09-19 LAB — URINALYSIS, COMPLETE
Bilirubin, UA: NEGATIVE
Leukocytes,UA: NEGATIVE
Nitrite, UA: NEGATIVE
Protein,UA: NEGATIVE
RBC, UA: NEGATIVE
Specific Gravity, UA: 1.015 (ref 1.005–1.030)
Urobilinogen, Ur: 0.2 mg/dL (ref 0.2–1.0)
pH, UA: 5 (ref 5.0–7.5)

## 2021-09-19 LAB — MICROSCOPIC EXAMINATION
Bacteria, UA: NONE SEEN
RBC, Urine: NONE SEEN /hpf (ref 0–2)

## 2021-09-19 NOTE — Progress Notes (Signed)
09/19/2021 9:33 AM   [name] 1965-12-07 [id number]  Referring provider: Steele Sizer, MD [address] [place],   [address]  Chief Complaint  Patient presents with   Other    Interstim    Urinary Incontinence    HPI: When I saw the patient in February 2017 the interstim device x-rays looked in good position.  It was interrogated and the impedance check was normal.  She turned the device up she felt it vaginally.  She had good battery life then.  She recently saw Larene Beach and had a KUB November 27, 2017.  Impedance check was done and bladder was found to be dead.  She has been having some vaginal discomfort apparently with intercourse and has had a previous bladder suspension.   The patient thinks her InterStim was working beautifully until several weeks ago.  She reported being almost completely dry.  Now she has urge incontinence and milder stress incontinence.  She does not wear pads because she says they irritate her.  She changes her close several times.  She ranks the incontinence is milder.  She is now getting up 4 times at night and set up twice.  She can void every 15-20 minutes and cannot hold it for 2 hours.  She feels discomfort or pressure over the IPG.  She is allergic to amoxicillin   December 31, 2017 the patient had the IPG change.  All 4-lead positions looked excellent with good Bellows and toe responses at very low amplitude.   We can see dramatically better.  Improved urge incontinence.  She still has mild stress incontinence and I educated her regarding this.  Incisions look normal.  Some basic teaching was provided by our nurses.     Patient had called in leaking and was trouble shot by Judson Roch on Jan 21, 2018. e By history I believe that this did help significantly but in the last 6 weeks she started to leak more again.  She can leak sitting not associated with awareness.  She has moderate bedwetting.  She has urge incontinence.  She can soak a pad  when she goes out in public.  At home she changes her close.  She does not have cystitis symptoms but urine sent for culture.  She does not change the settings whatsoever  She did not feel it rectally and vaginally at 1.1 but did at 1.5 and was left at that amplitude.  Again there was a lot of time needed to connect to the device for unknown reasons.  This happened last time as well.  Judson Roch will see her in 2 weeks and make further adjustments.  We do not think the patient is able to do this on her own.  She was told to improve her diet since she had glucose in the urine.  Urine cultures positive    Today I have not seen the patient since August 2019.  The patient said 2 or 3 months ago she was wearing 1 pad a day especially when she went out in public.  Now she wears 3 pads a day that are quite wet.  She leaks with coughing sneezing and with urgency.  She can leak without awareness.  She has bedwetting.  She will wake up and also leak before she gets to the bedside commode  She is voiding every 2-3 hours gets up 3 times a night.  When she turns up the amplitude she feels it vaginally.  She does not know  how to change programs  Clinically not infected     PMH: Past Medical History:  Diagnosis Date   Anxiety    Arthritis    Asthma    Bipolar depression ([place])    CHF (congestive heart failure) (HCC)    Chronic headaches    migraines have lessened   Chronic kidney disease    ddstage 111   COPD (chronic obstructive pulmonary disease) (HCC)    Coronary artery disease    Depression    Diabetic neuropathy (HCC)    Dyspnea    uses inhalers for this   Dysrhythmia    rapid   Fatty liver    GERD (gastroesophageal reflux disease)    Headache    Heart attack ([place]) 2009   Hyperlipidemia    Hypertension    Hypomagnesemia    Hypothyroid    Irregular heart rhythm    Liver lesion    Favored to be benign on CT; MRI of abd w/contract in Aug 2016. Liver Biopsy-Negative, March 2016   Morbid  obesity ([place])    OSA (obstructive sleep apnea)    Peripheral vascular disease (HCC)    Pinched nerve    Back   Pneumonia    RLS (restless legs syndrome)    Seasonal allergies    Sleep apnea    uses oxygen at night 2L d/t cpap did not fit   Type 2 diabetes, uncontrolled, with neuropathy 10/15/2015   Overview:  Microalbumin 7.7 12/2010: Hgb A1c 10.2% 12/2010 and 9.7% 03/2011, eye exam 10/2009 [facility]    Urinary incontinence    Vitamin D deficiency     Surgical History: Past Surgical History:  Procedure Laterality Date   ABDOMINAL HYSTERECTOMY     Partial   bladder stim  2007   BLADDER SURGERY     x 2. tacking of bladder and the other was the stimulator placement   CHOLECYSTECTOMY     COLONOSCOPY WITH PROPOFOL N/A 03/04/2019   Procedure: COLONOSCOPY WITH PROPOFOL;  Surgeon: [name], MD;  Location: [facility] ENDOSCOPY;  Service: Endoscopy;  Laterality: N/A;   ESOPHAGOGASTRODUODENOSCOPY (EGD) WITH PROPOFOL N/A 03/04/2019   Procedure: ESOPHAGOGASTRODUODENOSCOPY (EGD) WITH PROPOFOL;  Surgeon: [name], MD;  Location: [facility] ENDOSCOPY;  Service: Endoscopy;  Laterality: N/A;   INCISION AND DRAINAGE ABSCESS Right 04/16/2019   Procedure: INCISION AND DRAINAGE ABSCESS;  Surgeon: Benjamine Sprague, DO;  Location: [facility] ORS;  Service: General;  Laterality: Right;   INTERSTIM-GENERATOR CHANGE N/A 12/31/2017   Procedure: INTERSTIM-GENERATOR CHANGE;  Surgeon: Bjorn Loser, MD;  Location: [facility] ORS;  Service: Urology;  Laterality: N/A;   LIVER BIOPSY  March 2016   Negative   LUNG BIOPSY  2015   small spot on lung   TUBAL LIGATION     UPPER GI ENDOSCOPY     WRIST SURGERY Right 11/02/2019    Home Medications:  Allergies as of 09/19/2021       Reactions   Mirtazapine Anaphylaxis   REACTION: closes throat   Morphine And Related    anaphylaxis   Sulfamethoxazole-trimethoprim Rash   REACTION: Whelps all over   Vicodin Hp [hydrocodone-acetaminophen]    Amoxicillin Er Nausea And Vomiting   Pt  doesn't remember problem with amoxicillin -  SE Has patient had a PCN reaction causing immediate rash, facial/tongue/throat swelling, SOB or lightheadedness with hypotension: No Has patient had a PCN reaction causing severe rash involving mucus membranes or skin necrosis: No Has patient had a PCN reaction that required hospitalization: No Has patient  had a PCN reaction occurring within the last 10 years: Yes If all of the above answers are "NO", then may proceed with Cephalosporin use.   Codeine Itching   REACTION: itch, rash   Hydrocodone-acetaminophen Rash   REACTION: rash   Keflex [cephalexin] Rash   Pt does not remember having a reaction or rash, no airway involvement   Prasterone Nausea And Vomiting        Medication List        Accurate as of September 19, 2021  9:33 AM. If you have any questions, ask your nurse or doctor.          atorvastatin 80 MG tablet Commonly known as: LIPITOR Take 80 mg by mouth at bedtime.   busPIRone 30 MG tablet Commonly known as: BUSPAR Take 30 mg by mouth 2 (two) times daily.   citalopram 20 MG tablet Commonly known as: CELEXA Take 20 mg by mouth daily.   esomeprazole 40 MG capsule Commonly known as: NEXIUM Take 1 capsule (40 mg total) by mouth daily before breakfast.   ezetimibe 10 MG tablet Commonly known as: ZETIA Take 10 mg by mouth at bedtime.   fenofibrate micronized 134 MG capsule Commonly known as: LOFIBRA Take 134 mg by mouth at bedtime.   fluticasone 50 MCG/ACT nasal spray Commonly known as: FLONASE Place 2 sprays into both nostrils daily.   gabapentin 300 MG capsule Commonly known as: NEURONTIN Take 300 mg by mouth 2 (two) times daily.   gabapentin 600 MG tablet Commonly known as: NEURONTIN Take 600 mg by mouth at bedtime.   Glyxambi 25-5 MG Tabs Generic drug: Empagliflozin-linaGLIPtin Take 1 tablet by mouth daily.   HumuLIN R U-500 KwikPen 500 UNIT/ML KwikPen Generic drug: insulin regular human  CONCENTRATED Inject 70 Units into the skin 3 (three) times daily with meals.   lamoTRIgine 200 MG tablet Commonly known as: LAMICTAL Take 200 mg by mouth 2 (two) times daily.   lisinopril 2.5 MG tablet Commonly known as: ZESTRIL Take 1 tablet (2.5 mg total) by mouth daily.   metoprolol tartrate 25 MG tablet Commonly known as: LOPRESSOR Take 1 tablet (25 mg total) by mouth 2 (two) times daily. PLEASE CALL OFFICE TO SCHEDULE AN APPOINTMENT TO CONTINUE GETTING REFILLS.   nitroGLYCERIN 0.4 MG SL tablet Commonly known as: NITROSTAT Place 1 tablet (0.4 mg total) under the tongue every 5 (five) minutes as needed for chest pain.   NovoLOG FlexPen 100 UNIT/ML FlexPen Generic drug: insulin aspart Inject 3 Units subcutaneously. Sliding scale.   OneTouch Delica Lancets 17P Misc by Does not apply route.   promethazine 12.5 MG tablet Commonly known as: PHENERGAN TAKE 1 TABLET BY MOUTH EVERY 6 HOURS AS NEEDED FOR NAUSEA OR VOMITING   QUEtiapine 200 MG tablet Commonly known as: SEROQUEL Take 200 mg by mouth at bedtime. Along with 363m dose for a total of 5077m  QUEtiapine 300 MG tablet Commonly known as: SEROQUEL Take 300 mg by mouth at bedtime. Along with 20072mose for a total of 500m64mSUMAtriptan 100 MG tablet Commonly known as: IMITREX TAKE 1 TABLET BY MOUTH ONCE FOR 1 DOSE. MAY REPEAT IN 2 HOURS IF HEADACHE PERSISTS OR RECURS.   Synthroid 100 MCG tablet Generic drug: levothyroxine Take 100 mcg by mouth daily.   Trulicity 1.5 MG/0ZW/2.5ENn Generic drug: Dulaglutide Inject 1.5 mg into the skin once a week. (Wednesdays)        Allergies:  Allergies  Allergen Reactions   Mirtazapine Anaphylaxis  REACTION: closes throat   Morphine And Related     anaphylaxis   Sulfamethoxazole-Trimethoprim Rash    REACTION: Whelps all over   Vicodin Hp [Hydrocodone-Acetaminophen]    Amoxicillin Er Nausea And Vomiting    Pt doesn't remember problem with amoxicillin -  SE Has  patient had a PCN reaction causing immediate rash, facial/tongue/throat swelling, SOB or lightheadedness with hypotension: No Has patient had a PCN reaction causing severe rash involving mucus membranes or skin necrosis: No Has patient had a PCN reaction that required hospitalization: No Has patient had a PCN reaction occurring within the last 10 years: Yes If all of the above answers are "NO", then may proceed with Cephalosporin use.   Codeine Itching    REACTION: itch, rash   Hydrocodone-Acetaminophen Rash    REACTION: rash   Keflex [Cephalexin] Rash    Pt does not remember having a reaction or rash, no airway involvement   Prasterone Nausea And Vomiting    Family History: Family History  Problem Relation Age of Onset   Diabetes Mother    Heart disease Mother    Hyperlipidemia Mother    Hypertension Mother    Kidney disease Mother    Mental illness Mother    Hypothyroidism Mother    Stroke Mother    Osteoporosis Mother    Glaucoma Mother    Congestive Heart Failure Mother    Hypertension Father    Asthma Daughter    Cancer Daughter        throat   Bipolar disorder Daughter    Heart disease Maternal Grandfather    Rheum arthritis Maternal Grandfather    Cancer Maternal Grandfather        liver   Kidney disease Maternal Grandfather    Cancer Paternal Grandmother        lung   Kidney disease Brother    Breast cancer Maternal Grandmother 67   Bipolar disorder Daughter    Hypertension Daughter    Restless legs syndrome Daughter     Social History:  reports that she has never smoked. She has never used smokeless tobacco. She reports that she does not drink alcohol and does not use drugs.  ROS:                                        Physical Exam: BP 137/77    Pulse 87    Ht 4' 11"  (1.499 m)    Wt 88.5 kg    BMI 39.39 kg/m   Constitutional:  Alert and oriented, No acute distress. HEENT: [place] AT, moist mucus membranes.  Trachea midline, no  masses.   Laboratory Data: Lab Results  Component Value Date   WBC 6.5 06/18/2021   HGB 11.7 (A) 06/18/2021   HCT 38 06/18/2021   MCV 92.7 01/25/2021   PLT 277 06/18/2021    Lab Results  Component Value Date   CREATININE 1.1 06/18/2021    No results found for: PSA  No results found for: TESTOSTERONE  Lab Results  Component Value Date   HGBA1C 7.2 06/18/2021    Urinalysis    Component Value Date/Time   COLORURINE YELLOW (A) 01/25/2021 1053   APPEARANCEUR CLEAR (A) 01/25/2021 1053   APPEARANCEUR Clear 04/15/2018 1433   LABSPEC 1.027 01/25/2021 1053   LABSPEC 1.026 10/09/2014 1808   PHURINE 5.0 01/25/2021 1053   GLUCOSEU >=500 (A) 01/25/2021 1053  GLUCOSEU >=500 10/09/2014 1808   HGBUR NEGATIVE 01/25/2021 1053   HGBUR moderate 01/04/2009 0913   BILIRUBINUR negative 04/18/2021 1025   BILIRUBINUR Negative 04/15/2018 1433   BILIRUBINUR Negative 10/09/2014 1808   KETONESUR NEGATIVE 01/25/2021 1053   PROTEINUR Positive (A) 04/18/2021 1025   PROTEINUR NEGATIVE 01/25/2021 1053   UROBILINOGEN 0.2 04/18/2021 1025   UROBILINOGEN 0.2 01/04/2009 0913   NITRITE negative 04/18/2021 1025   NITRITE NEGATIVE 01/25/2021 1053   LEUKOCYTESUR Small (1+) (A) 04/18/2021 1025   LEUKOCYTESUR NEGATIVE 01/25/2021 1053   LEUKOCYTESUR Negative 10/09/2014 1808    Pertinent Imaging:   Assessment & Plan: Due to staffing issues we will need to have the [employer] rep come in and troubleshoot the device.  We will proceed accordingly.  Call if urine culture is positive  There are no diagnoses linked to this encounter.  No follow-ups on file.  Reece Packer, MD  [place] [address] [place], [place] [address] [phone]

## 2021-09-19 NOTE — Telephone Encounter (Signed)
Pt needs appointment for trouble shooting on her interstem machine. I emailed [employer] rep Modesta Messing. Her email is [email or web address] [phone]. Awaiting response to set up.

## 2021-09-21 ENCOUNTER — Other Ambulatory Visit: Payer: Self-pay | Admitting: Gastroenterology

## 2021-09-21 LAB — CULTURE, URINE COMPREHENSIVE

## 2021-09-21 NOTE — Telephone Encounter (Signed)
I spoke with Colletta Maryland who is over Interstem rep. She is able to come to clinic Monday 1/23 at 10am

## 2021-09-23 DIAGNOSIS — D631 Anemia in chronic kidney disease: Secondary | ICD-10-CM | POA: Diagnosis not present

## 2021-09-23 DIAGNOSIS — E7800 Pure hypercholesterolemia, unspecified: Secondary | ICD-10-CM | POA: Diagnosis not present

## 2021-09-23 DIAGNOSIS — I10 Essential (primary) hypertension: Secondary | ICD-10-CM | POA: Diagnosis not present

## 2021-09-23 DIAGNOSIS — K760 Fatty (change of) liver, not elsewhere classified: Secondary | ICD-10-CM | POA: Diagnosis not present

## 2021-09-26 ENCOUNTER — Other Ambulatory Visit: Payer: Self-pay

## 2021-09-26 ENCOUNTER — Ambulatory Visit: Payer: Medicare Other

## 2021-09-26 DIAGNOSIS — N3946 Mixed incontinence: Secondary | ICD-10-CM

## 2021-09-26 NOTE — Progress Notes (Signed)
Patient met with [employer] rep Modesta Messing for Interstim trouble shooting. Colletta Maryland states patient has only tried program 3. Modesta Messing advised patient to trying the remaining 6 programs ( 2 weeks each program). Patient was advised if a program gives her 50% relief or more to not change programs. Impedance is unremarkable. Battery life is atleast 2 years. Discussed with Dr.MacDiarmid, agrees with plan. Patient is scheduled with Dr.MacDiarmid for April 2023

## 2021-09-27 ENCOUNTER — Encounter: Payer: Self-pay | Admitting: Family Medicine

## 2021-09-29 ENCOUNTER — Ambulatory Visit (INDEPENDENT_AMBULATORY_CARE_PROVIDER_SITE_OTHER): Payer: Medicare Other

## 2021-09-29 DIAGNOSIS — E785 Hyperlipidemia, unspecified: Secondary | ICD-10-CM

## 2021-09-29 DIAGNOSIS — E1169 Type 2 diabetes mellitus with other specified complication: Secondary | ICD-10-CM

## 2021-09-29 DIAGNOSIS — J449 Chronic obstructive pulmonary disease, unspecified: Secondary | ICD-10-CM

## 2021-09-29 DIAGNOSIS — H6592 Unspecified nonsuppurative otitis media, left ear: Secondary | ICD-10-CM

## 2021-09-29 NOTE — Patient Instructions (Signed)
Thank you for allowing the Chronic Care Management team to participate in your care. It was great speaking with you today!

## 2021-09-29 NOTE — Chronic Care Management (AMB) (Signed)
Chronic Care Management   CCM RN Visit Note  09/29/2021 Name: [name] MRN: [id number] DOB: 1966/09/02  Subjective: [name] is a 56 y.o. year old female who is a primary care patient of Steele Sizer, MD. The care management team was consulted for assistance with disease management and care coordination needs.    Engaged with patient by telephone for follow up visit in response to provider referral for case management and care coordination services.   Consent to Services:  The patient was given information about Chronic Care Management services, agreed to services, and gave verbal consent prior to initiation of services.  Please see initial visit note for detailed documentation.    Assessment: Review of patient past medical history, allergies, medications, health status, including review of consultants reports, laboratory and other test data, was performed as part of comprehensive evaluation and provision of chronic care management services.   SDOH (Social Determinants of Health) assessments and interventions performed: No  CCM Care Plan  Allergies  Allergen Reactions   Mirtazapine Anaphylaxis    REACTION: closes throat   Morphine And Related     anaphylaxis   Sulfamethoxazole-Trimethoprim Rash    REACTION: Whelps all over   Vicodin Hp [Hydrocodone-Acetaminophen]    Amoxicillin Er Nausea And Vomiting    Pt doesn't remember problem with amoxicillin -  SE Has patient had a PCN reaction causing immediate rash, facial/tongue/throat swelling, SOB or lightheadedness with hypotension: No Has patient had a PCN reaction causing severe rash involving mucus membranes or skin necrosis: No Has patient had a PCN reaction that required hospitalization: No Has patient had a PCN reaction occurring within the last 10 years: Yes If all of the above answers are "NO", then may proceed with Cephalosporin use.   Codeine Itching    REACTION: itch, rash   Hydrocodone-Acetaminophen Rash     REACTION: rash   Keflex [Cephalexin] Rash    Pt does not remember having a reaction or rash, no airway involvement   Prasterone Nausea And Vomiting    Outpatient Encounter Medications as of 09/29/2021  Medication Sig   atorvastatin (LIPITOR) 80 MG tablet Take 80 mg by mouth at bedtime.    busPIRone (BUSPAR) 30 MG tablet Take 30 mg by mouth 2 (two) times daily.   citalopram (CELEXA) 20 MG tablet Take 20 mg by mouth daily.   esomeprazole (NEXIUM) 40 MG capsule Take 1 capsule (40 mg total) by mouth in the morning. MUST SCHEDULE OFFICE VISIT   ezetimibe (ZETIA) 10 MG tablet Take 10 mg by mouth at bedtime.   fenofibrate micronized (LOFIBRA) 134 MG capsule Take 134 mg by mouth at bedtime.   fluticasone (FLONASE) 50 MCG/ACT nasal spray Place 2 sprays into both nostrils daily.   gabapentin (NEURONTIN) 300 MG capsule Take 300 mg by mouth 2 (two) times daily.   gabapentin (NEURONTIN) 600 MG tablet Take 600 mg by mouth at bedtime.   GLYXAMBI 25-5 MG TABS Take 1 tablet by mouth daily.   HUMULIN R U-500 KWIKPEN 500 UNIT/ML kwikpen Inject 70 Units into the skin 3 (three) times daily with meals.   insulin aspart (NOVOLOG FLEXPEN) 100 UNIT/ML FlexPen Inject 3 Units subcutaneously. Sliding scale.   lamoTRIgine (LAMICTAL) 200 MG tablet Take 200 mg by mouth 2 (two) times daily.    lisinopril (ZESTRIL) 2.5 MG tablet Take 1 tablet (2.5 mg total) by mouth daily.   metoprolol tartrate (LOPRESSOR) 25 MG tablet Take 1 tablet (25 mg total) by mouth 2 (two) times  daily. PLEASE CALL OFFICE TO SCHEDULE AN APPOINTMENT TO CONTINUE GETTING REFILLS.   nitroGLYCERIN (NITROSTAT) 0.4 MG SL tablet Place 1 tablet (0.4 mg total) under the tongue every 5 (five) minutes as needed for chest pain.   OneTouch Delica Lancets 19E MISC by Does not apply route.   promethazine (PHENERGAN) 12.5 MG tablet TAKE 1 TABLET BY MOUTH EVERY 6 HOURS AS NEEDED FOR NAUSEA OR VOMITING   QUEtiapine (SEROQUEL) 200 MG tablet Take 200 mg by mouth at  bedtime. Along with 337m dose for a total of 5054m  QUEtiapine (SEROQUEL) 300 MG tablet Take 300 mg by mouth at bedtime. Along with 20038mose for a total of 500m41mSUMAtriptan (IMITREX) 100 MG tablet TAKE 1 TABLET BY MOUTH ONCE FOR 1 DOSE. MAY REPEAT IN 2 HOURS IF HEADACHE PERSISTS OR RECURS.   SYNTHROID 100 MCG tablet Take 100 mcg by mouth daily.   TRULICITY 1.5 MG/0RD/4.0CXN Inject 1.5 mg into the skin once a week. (Wednesdays)   No facility-administered encounter medications on file as of 09/29/2021.    Patient Active Problem List   Diagnosis Date Noted   Controlled type 2 diabetes with neuropathy (HCC)[place]/27/2022   Interstitial cystitis 06/30/2021   Atherosclerosis of aorta (HCC)[place]/25/2022   Iron deficiency anemia due to chronic blood loss    Polyp of ascending colon    Vitamin B12 deficiency 08/08/2018   Patient is full code 10/30/2017   Coronary artery disease of native artery of native heart with stable angina pectoris (HCC)[place]/01/2018   Plantar fascial fibromatosis 10/09/2017   Tendinitis of wrist 10/09/2017   Schizoaffective disorder, bipolar type (HCC)[place]/25/2019   Adenomatous colon polyp 10/15/2015   Bipolar affective disorder (HCC)[place]/06/2016   CN (constipation) 10/15/2015   HLD (hyperlipidemia) 10/15/2015   NASH (nonalcoholic steatohepatitis) 10/15/2015   Morbid obesity (HCC)[place]/06/2016   Restless leg syndrome 10/15/2015   Low back pain with left-sided sciatica 08/18/2015   Apnea, sleep 05/05/2015   Heart valve disease 05/05/2015   Chronic kidney disease, stage III (moderate) (HCC) 03/15/2015   Migraine 03/12/2015   Chronic bronchitis (HCC)[place]/18/2016   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HCC)[place]/18/2016   GERD (gastroesophageal reflux disease) 01/20/2015   Hypothyroid    Vitamin D deficiency    Urinary incontinence    Liver lesion    Hypomagnesemia    Obstructive sleep apnea 10/20/2013   Allergic rhinitis 07/04/2007      Patient Care Plan: RN Care Management  Plan of Care     Problem Identified: HLD, COPD-Chronic Bronchitis, DM      Long-Range Goal: Disease Progression Prevented or Minimized   Start Date: 09/29/2021  Expected End Date: 12/28/2021  Priority: High  Note:   Current Barriers:  Chronic Disease Management support and education needs related to HLD, COPD, and DMII   RNCM Clinical Goal(s):  Patient will demonstrate ongoing adherence to prescribed treatment plan for HLD, COPD, and DMII through collaboration with the provider, RN Care Manager, and the care team.  Interventions: 1:1 collaboration with primary care provider regarding development and update of comprehensive plan of care as evidenced by provider attestation and co-signature Inter-disciplinary care team collaboration (see longitudinal plan of care) Evaluation of current treatment plan related to  self management and patient's adherence to plan as established by provider   COPD Interventions:   Reviewed medications and COPD management. Reports symptoms have been well controlled. Denies episodes of shortness of breath. Denies changes or decrease in activity tolerance.  Reviewed plan and importance of daily self-assessment. Advised to notify a provider or make an appointment if experiencing moderate symptoms for greater than 48 hours without improvement. Reviewed increased risk for respiratory complications. Advised to avoid sick contacts and utilize recommended prevention strategies to reduce risk of respiratory infection. Reviewed worsening symptoms that require immediate medical attention.    Diabetes Interventions:   Lab Results  Component Value Date   HGBA1C 7.7 09/17/2021  Reviewed plan for diabetes management. Reports taking all medications as prescribed. Reviewed importance of monitoring blood glucose levels. Reviewed s/sx of hypoglycemia and hyperglycemia. Denies recent symptoms. Reports blood glucose readings have been within range.  Reviewed nutritional intake  and importance of adhering to the recommended ADA/modified carb diet. Reports attempting to adhere to the recommended diet. Denies need for additional resources.  Hyperlipidemia Interventions:  Lab Results  Component Value Date   CHOL 104 09/17/2021   HDL 32 (A) 09/17/2021   LDLCALC 49 09/17/2021   LDLDIRECT 65.4 06/08/2008   TRIG 128 09/17/2021   CHOLHDL 5.9 (H) 09/28/2016    Medications reviewed  Reviewed provider established cholesterol goals  Discussed importance of regular laboratory monitoring as prescribed Reviewed importance of limiting foods high in cholesterol Reviewed exercise goals and target of 150 minutes per week  Chronic Pansinusitis Interventions:  Discussed symptoms related to evaluation earlier this month for Chronic Pansinusitis. Reports symptoms have resolved. Reports taking full course of medications prescribed. Reviewed providers plan to refer to ENT if she continues to experience hearing loss to her left ear. Reports significant improvement after completing medications. Declines need for a referral.  Verbalized awareness of need to contact the clinic if symptoms return.   Patient Goals/Self-Care Activities: Take all medications as prescribed Attend all scheduled provider appointments Call pharmacy for medication refills 3-7 days in advance of running out of medications Perform all self care activities independently  Call provider office for new concerns or questions   Follow Up Plan:   Will follow up in May      PLAN: A member of the care management team will follow up in May.   [facility] Care Management [facility] [phone]

## 2021-10-04 ENCOUNTER — Other Ambulatory Visit: Payer: Self-pay

## 2021-10-04 ENCOUNTER — Emergency Department: Payer: Medicare Other

## 2021-10-04 ENCOUNTER — Ambulatory Visit: Payer: Self-pay | Admitting: *Deleted

## 2021-10-04 ENCOUNTER — Observation Stay: Payer: Medicare Other

## 2021-10-04 ENCOUNTER — Observation Stay
Admission: EM | Admit: 2021-10-04 | Discharge: 2021-10-05 | Disposition: A | Discharge status: Home / Self Care | Payer: Medicare Other | Attending: Hospitalist | Admitting: Hospitalist

## 2021-10-04 DIAGNOSIS — Z20822 Contact with and (suspected) exposure to covid-19: Secondary | ICD-10-CM | POA: Insufficient documentation

## 2021-10-04 DIAGNOSIS — G43909 Migraine, unspecified, not intractable, without status migrainosus: Secondary | ICD-10-CM | POA: Diagnosis not present

## 2021-10-04 DIAGNOSIS — J449 Chronic obstructive pulmonary disease, unspecified: Secondary | ICD-10-CM

## 2021-10-04 DIAGNOSIS — R519 Headache, unspecified: Secondary | ICD-10-CM | POA: Diagnosis not present

## 2021-10-04 DIAGNOSIS — F250 Schizoaffective disorder, bipolar type: Secondary | ICD-10-CM | POA: Diagnosis not present

## 2021-10-04 DIAGNOSIS — Z794 Long term (current) use of insulin: Secondary | ICD-10-CM | POA: Diagnosis not present

## 2021-10-04 DIAGNOSIS — E1129 Type 2 diabetes mellitus with other diabetic kidney complication: Secondary | ICD-10-CM | POA: Diagnosis present

## 2021-10-04 DIAGNOSIS — Q283 Other malformations of cerebral vessels: Secondary | ICD-10-CM | POA: Diagnosis not present

## 2021-10-04 DIAGNOSIS — N1831 Chronic kidney disease, stage 3a: Secondary | ICD-10-CM | POA: Diagnosis not present

## 2021-10-04 DIAGNOSIS — I10 Essential (primary) hypertension: Secondary | ICD-10-CM | POA: Diagnosis present

## 2021-10-04 DIAGNOSIS — I5032 Chronic diastolic (congestive) heart failure: Secondary | ICD-10-CM | POA: Diagnosis present

## 2021-10-04 DIAGNOSIS — E039 Hypothyroidism, unspecified: Secondary | ICD-10-CM | POA: Diagnosis not present

## 2021-10-04 DIAGNOSIS — F418 Other specified anxiety disorders: Secondary | ICD-10-CM | POA: Diagnosis present

## 2021-10-04 DIAGNOSIS — E785 Hyperlipidemia, unspecified: Secondary | ICD-10-CM

## 2021-10-04 DIAGNOSIS — I2510 Atherosclerotic heart disease of native coronary artery without angina pectoris: Secondary | ICD-10-CM | POA: Diagnosis present

## 2021-10-04 DIAGNOSIS — I130 Hypertensive heart and chronic kidney disease with heart failure and stage 1 through stage 4 chronic kidney disease, or unspecified chronic kidney disease: Secondary | ICD-10-CM | POA: Diagnosis not present

## 2021-10-04 DIAGNOSIS — G459 Transient cerebral ischemic attack, unspecified: Secondary | ICD-10-CM | POA: Diagnosis not present

## 2021-10-04 DIAGNOSIS — E1169 Type 2 diabetes mellitus with other specified complication: Secondary | ICD-10-CM | POA: Diagnosis not present

## 2021-10-04 DIAGNOSIS — R262 Difficulty in walking, not elsewhere classified: Secondary | ICD-10-CM | POA: Diagnosis not present

## 2021-10-04 DIAGNOSIS — Z79899 Other long term (current) drug therapy: Secondary | ICD-10-CM | POA: Diagnosis not present

## 2021-10-04 DIAGNOSIS — E1122 Type 2 diabetes mellitus with diabetic chronic kidney disease: Secondary | ICD-10-CM | POA: Insufficient documentation

## 2021-10-04 DIAGNOSIS — E669 Obesity, unspecified: Secondary | ICD-10-CM | POA: Diagnosis not present

## 2021-10-04 DIAGNOSIS — J45909 Unspecified asthma, uncomplicated: Secondary | ICD-10-CM | POA: Diagnosis not present

## 2021-10-04 DIAGNOSIS — R4781 Slurred speech: Secondary | ICD-10-CM | POA: Diagnosis not present

## 2021-10-04 LAB — DIFFERENTIAL
Abs Immature Granulocytes: 0.06 10*3/uL (ref 0.00–0.07)
Basophils Absolute: 0 10*3/uL (ref 0.0–0.1)
Basophils Relative: 1 %
Eosinophils Absolute: 0.2 10*3/uL (ref 0.0–0.5)
Eosinophils Relative: 3 %
Immature Granulocytes: 1 %
Lymphocytes Relative: 34 %
Lymphs Abs: 2.1 10*3/uL (ref 0.7–4.0)
Monocytes Absolute: 0.6 10*3/uL (ref 0.1–1.0)
Monocytes Relative: 11 %
Neutro Abs: 3.1 10*3/uL (ref 1.7–7.7)
Neutrophils Relative %: 50 %

## 2021-10-04 LAB — CBC
HCT: 38.4 % (ref 36.0–46.0)
Hemoglobin: 11.7 g/dL — ABNORMAL LOW (ref 12.0–15.0)
MCH: 29.6 pg (ref 26.0–34.0)
MCHC: 30.5 g/dL (ref 30.0–36.0)
MCV: 97.2 fL (ref 80.0–100.0)
Platelets: 213 10*3/uL (ref 150–400)
RBC: 3.95 MIL/uL (ref 3.87–5.11)
RDW: 14.4 % (ref 11.5–15.5)
WBC: 6 10*3/uL (ref 4.0–10.5)
nRBC: 0 % (ref 0.0–0.2)

## 2021-10-04 LAB — COMPREHENSIVE METABOLIC PANEL
ALT: 40 U/L (ref 0–44)
AST: 48 U/L — ABNORMAL HIGH (ref 15–41)
Albumin: 3.4 g/dL — ABNORMAL LOW (ref 3.5–5.0)
Alkaline Phosphatase: 46 U/L (ref 38–126)
Anion gap: 8 (ref 5–15)
BUN: 25 mg/dL — ABNORMAL HIGH (ref 6–20)
CO2: 23 mmol/L (ref 22–32)
Calcium: 8.6 mg/dL — ABNORMAL LOW (ref 8.9–10.3)
Chloride: 108 mmol/L (ref 98–111)
Creatinine, Ser: 1.34 mg/dL — ABNORMAL HIGH (ref 0.44–1.00)
GFR, Estimated: 46 mL/min — ABNORMAL LOW (ref >60)
Glucose, Bld: 152 mg/dL — ABNORMAL HIGH (ref 70–99)
Potassium: 4.7 mmol/L (ref 3.5–5.1)
Sodium: 139 mmol/L (ref 135–145)
Total Bilirubin: 0.9 mg/dL (ref 0.3–1.2)
Total Protein: 5.7 g/dL — ABNORMAL LOW (ref 6.5–8.1)

## 2021-10-04 LAB — CBG MONITORING, ED: Glucose-Capillary: 138 mg/dL — ABNORMAL HIGH (ref 70–99)

## 2021-10-04 LAB — RESP PANEL BY RT-PCR (FLU A&B, COVID) ARPGX2
Influenza A by PCR: NEGATIVE
Influenza B by PCR: NEGATIVE
SARS Coronavirus 2 by RT PCR: NEGATIVE

## 2021-10-04 LAB — BRAIN NATRIURETIC PEPTIDE: B Natriuretic Peptide: 8.7 pg/mL (ref 0.0–100.0)

## 2021-10-04 LAB — PROTIME-INR
INR: 0.9 (ref 0.8–1.2)
Prothrombin Time: 12.2 seconds (ref 11.4–15.2)

## 2021-10-04 LAB — APTT: aPTT: <20 seconds — ABNORMAL LOW (ref 24–36)

## 2021-10-04 MED ORDER — GABAPENTIN 600 MG PO TABS
600.0000 mg | ORAL_TABLET | Freq: Every day | ORAL | Status: DC
  Administered 2021-10-04: 600 mg via ORAL
  Filled 2021-10-04: qty 1

## 2021-10-04 MED ORDER — ENOXAPARIN SODIUM 40 MG/0.4ML IJ SOSY
40.0000 mg | PREFILLED_SYRINGE | INTRAMUSCULAR | Status: DC
  Administered 2021-10-04: 40 mg via SUBCUTANEOUS
  Filled 2021-10-04: qty 0.4

## 2021-10-04 MED ORDER — FLUTICASONE PROPIONATE 50 MCG/ACT NA SUSP
2.0000 | Freq: Every day | NASAL | Status: DC
  Filled 2021-10-04: qty 16

## 2021-10-04 MED ORDER — BUSPIRONE HCL 15 MG PO TABS
30.0000 mg | ORAL_TABLET | Freq: Two times a day (BID) | ORAL | Status: DC
Start: 2021-10-04 — End: 2021-10-05
  Administered 2021-10-04 – 2021-10-05 (×2): 30 mg via ORAL
  Filled 2021-10-04 (×3): qty 2
  Filled 2021-10-04 (×2): qty 6

## 2021-10-04 MED ORDER — FENOFIBRATE 160 MG PO TABS
160.0000 mg | ORAL_TABLET | Freq: Every day | ORAL | Status: DC
  Administered 2021-10-05: 160 mg via ORAL
  Filled 2021-10-04: qty 1

## 2021-10-04 MED ORDER — ASPIRIN EC 81 MG PO TBEC
81.0000 mg | DELAYED_RELEASE_TABLET | Freq: Every day | ORAL | Status: DC
  Administered 2021-10-04 – 2021-10-05 (×2): 81 mg via ORAL
  Filled 2021-10-04 (×2): qty 1

## 2021-10-04 MED ORDER — METOCLOPRAMIDE HCL 5 MG/ML IJ SOLN
10.0000 mg | Freq: Once | INTRAMUSCULAR | Status: AC
  Administered 2021-10-04: 10 mg via INTRAVENOUS
  Filled 2021-10-04: qty 2

## 2021-10-04 MED ORDER — SODIUM CHLORIDE 0.9% FLUSH
3.0000 mL | Freq: Once | INTRAVENOUS | Status: AC
  Administered 2021-10-04: 3 mL via INTRAVENOUS

## 2021-10-04 MED ORDER — ALBUTEROL SULFATE (2.5 MG/3ML) 0.083% IN NEBU: 2.5000 mg | INHALATION_SOLUTION | RESPIRATORY_TRACT | Status: DC | PRN

## 2021-10-04 MED ORDER — IOHEXOL 350 MG/ML SOLN
100.0000 mL | Freq: Once | INTRAVENOUS | Status: AC | PRN
  Administered 2021-10-04: 100 mL via INTRAVENOUS

## 2021-10-04 MED ORDER — DIPHENHYDRAMINE HCL 25 MG PO CAPS
25.0000 mg | ORAL_CAPSULE | Freq: Four times a day (QID) | ORAL | Status: DC | PRN
  Administered 2021-10-05: 25 mg via ORAL
  Filled 2021-10-04: qty 1

## 2021-10-04 MED ORDER — SODIUM CHLORIDE 0.9 % IV BOLUS
1000.0000 mL | Freq: Once | INTRAVENOUS | Status: AC
  Administered 2021-10-04: 1000 mL via INTRAVENOUS

## 2021-10-04 MED ORDER — LAMOTRIGINE 100 MG PO TABS
200.0000 mg | ORAL_TABLET | Freq: Two times a day (BID) | ORAL | Status: DC
  Administered 2021-10-04 – 2021-10-05 (×2): 200 mg via ORAL
  Filled 2021-10-04 (×2): qty 2

## 2021-10-04 MED ORDER — CITALOPRAM HYDROBROMIDE 20 MG PO TABS
20.0000 mg | ORAL_TABLET | Freq: Every day | ORAL | Status: DC
Start: 2021-10-05 — End: 2021-10-05
  Administered 2021-10-05: 20 mg via ORAL
  Filled 2021-10-04: qty 1

## 2021-10-04 MED ORDER — NITROGLYCERIN 0.4 MG SL SUBL: 0.4000 mg | SUBLINGUAL_TABLET | SUBLINGUAL | Status: DC | PRN

## 2021-10-04 MED ORDER — ATORVASTATIN CALCIUM 20 MG PO TABS
80.0000 mg | ORAL_TABLET | Freq: Every day | ORAL | Status: DC
  Administered 2021-10-04: 80 mg via ORAL
  Filled 2021-10-04: qty 4

## 2021-10-04 MED ORDER — INSULIN REGULAR HUMAN (CONC) 500 UNIT/ML ~~LOC~~ SOPN: 50.0000 [IU] | PEN_INJECTOR | Freq: Three times a day (TID) | SUBCUTANEOUS | Status: DC

## 2021-10-04 MED ORDER — LEVOTHYROXINE SODIUM 100 MCG PO TABS
100.0000 ug | ORAL_TABLET | Freq: Every day | ORAL | Status: DC
  Administered 2021-10-05: 100 ug via ORAL
  Filled 2021-10-04: qty 1

## 2021-10-04 MED ORDER — QUETIAPINE FUMARATE 200 MG PO TABS
200.0000 mg | ORAL_TABLET | Freq: Every day | ORAL | Status: DC
Start: 2021-10-04 — End: 2021-10-05
  Administered 2021-10-04: 200 mg via ORAL
  Filled 2021-10-04: qty 1

## 2021-10-04 MED ORDER — ACETAMINOPHEN 160 MG/5ML PO SOLN
650.0000 mg | ORAL | Status: DC | PRN
  Filled 2021-10-04: qty 20.3

## 2021-10-04 MED ORDER — PANTOPRAZOLE SODIUM 40 MG PO TBEC
40.0000 mg | DELAYED_RELEASE_TABLET | Freq: Every day | ORAL | Status: DC
  Administered 2021-10-05: 40 mg via ORAL
  Filled 2021-10-04: qty 1

## 2021-10-04 MED ORDER — GABAPENTIN 300 MG PO CAPS
300.0000 mg | ORAL_CAPSULE | Freq: Two times a day (BID) | ORAL | Status: DC
Start: 2021-10-04 — End: 2021-10-05
  Administered 2021-10-04 – 2021-10-05 (×2): 300 mg via ORAL
  Filled 2021-10-04 (×2): qty 1

## 2021-10-04 MED ORDER — INSULIN ASPART 100 UNIT/ML IJ SOLN
0.0000 [IU] | Freq: Three times a day (TID) | INTRAMUSCULAR | Status: DC
  Administered 2021-10-05: 2 [IU] via SUBCUTANEOUS
  Filled 2021-10-04 (×2): qty 1

## 2021-10-04 MED ORDER — INSULIN ASPART 100 UNIT/ML IJ SOLN: 0.0000 [IU] | Freq: Every day | INTRAMUSCULAR | Status: DC

## 2021-10-04 MED ORDER — EZETIMIBE 10 MG PO TABS
10.0000 mg | ORAL_TABLET | Freq: Every day | ORAL | Status: DC
  Administered 2021-10-04: 10 mg via ORAL
  Filled 2021-10-04 (×3): qty 1

## 2021-10-04 MED ORDER — ACETAMINOPHEN 650 MG RE SUPP: 650.0000 mg | RECTAL | Status: DC | PRN

## 2021-10-04 MED ORDER — ACETAMINOPHEN 325 MG PO TABS
650.0000 mg | ORAL_TABLET | ORAL | Status: DC | PRN
  Administered 2021-10-04: 650 mg via ORAL
  Filled 2021-10-04: qty 2

## 2021-10-04 MED ORDER — HYDRALAZINE HCL 20 MG/ML IJ SOLN: 5.0000 mg | INTRAMUSCULAR | Status: DC | PRN

## 2021-10-04 MED ORDER — DM-GUAIFENESIN ER 30-600 MG PO TB12: 1.0000 | ORAL_TABLET | Freq: Two times a day (BID) | ORAL | Status: DC | PRN

## 2021-10-04 MED ORDER — ONDANSETRON HCL 4 MG/2ML IJ SOLN: 4.0000 mg | Freq: Three times a day (TID) | INTRAMUSCULAR | Status: DC | PRN

## 2021-10-04 MED ORDER — QUETIAPINE FUMARATE 300 MG PO TABS
300.0000 mg | ORAL_TABLET | Freq: Every day | ORAL | Status: DC
  Administered 2021-10-04: 300 mg via ORAL
  Filled 2021-10-04: qty 1

## 2021-10-04 MED ORDER — STROKE: EARLY STAGES OF RECOVERY BOOK: Freq: Once | Status: DC

## 2021-10-04 NOTE — Progress Notes (Incomplete)
° °      CROSS COVER NOTE  NAME: [name] MRN: [id number] DOB : [birth year]  CT radio:  nothing in head/neck  C/f  CTA PE?

## 2021-10-04 NOTE — Assessment & Plan Note (Addendum)
-   Continue home medications: BuSpar, Celexa, Seroquel

## 2021-10-04 NOTE — Assessment & Plan Note (Signed)
-   Hold home blood pressure medications to allow permissive hypertension -As needed hydralazine for SBP > 220, or dBP >120

## 2021-10-04 NOTE — H&P (Addendum)
History and Physical    Patient: [name] HYI:[id number] DOB: [birth year] DOA: 10/04/2021 DOS: the patient was seen and examined on 10/04/2021 PCP: Steele Sizer, MD   Patient coming from: Home  Chief Complaint: Difficulty speaking, blurry vision, poor balance  HPI: [name] is a 56 y.o. female with medical history significant of hypertension, hyperlipidemia, diabetes mellitus, COPD on 2 L oxygen at night, asthma, GERD, hypothyroidism, depression with anxiety, bipolar disorder, OSA not on CPAP, RLS, PVD, obesity with BMI 39.79, CAD, dCHF, migraine headache, CKD-3A, who presents with difficulty speaking, blurry vision, poor balance.  Patient states that her symptoms started yesterday at about 4 PM.  She states she has difficulty speaking, bilateral eye blurry vision, poor balance, difficulty walking.  Denies numbness or tinglings in extremities.  No hearing loss.  No facial droop.  Patient said her symptoms have improved, but still has some blurry vision. Patient does not have chest pain, cough, shortness of breath.  No fever or chills.  Denies nausea, vomiting, diarrhea or abdominal pain.  No symptoms of UTI.  Patient states that she has a bladder stimulator in place, and was told that she cannot do MRI.  Data review and ED course: I have personally reviewed labs and imaging studies.  WBC 6.0, pending COVID PCR, INR 0.9, PTT less than 10, renal function close to baseline, temperature normal, blood pressure 122/66, heart rate 87, RR 24, oxygen saturation 96% on room air.  CT of head is negative for acute intracranial abnormalities.  EKG: Reviewed KEG independently.  Sinus rhythm, QTC 431, LAD, low voltage, poor R wave progression, T wave inversion in V3-V5.  Review of Systems:   General: no fevers, chills, no body weight gain, has fatigue HEENT: no blurry vision, hearing changes or sore throat Respiratory: no dyspnea, coughing, wheezing CV: no chest pain, no palpitations GI: no  nausea, vomiting, abdominal pain, diarrhea, constipation GU: no dysuria, burning on urination, increased urinary frequency, hematuria  Ext: no leg edema Neuro: Has difficulty speaking, blurry vision, poor balance Skin: no rash, no skin tear. MSK: No muscle spasm, no deformity, no limitation of range of movement in spin Heme: No easy bruising.  Travel history: No recent long distant travel.   Past Medical History:  Diagnosis Date   Anxiety    Arthritis    Asthma    Bipolar depression ([place])    CHF (congestive heart failure) (HCC)    Chronic headaches    migraines have lessened   Chronic kidney disease    ddstage 111   COPD (chronic obstructive pulmonary disease) (HCC)    Coronary artery disease    Depression    Diabetic neuropathy (HCC)    Dyspnea    uses inhalers for this   Dysrhythmia    rapid   Fatty liver    GERD (gastroesophageal reflux disease)    Headache    Heart attack ([place]) 2009   Hyperlipidemia    Hypertension    Hypomagnesemia    Hypothyroid    Irregular heart rhythm    Liver lesion    Favored to be benign on CT; MRI of abd w/contract in Aug 2016. Liver Biopsy-Negative, March 2016   Morbid obesity ([place])    OSA (obstructive sleep apnea)    Peripheral vascular disease (HCC)    Pinched nerve    Back   Pneumonia    RLS (restless legs syndrome)    Seasonal allergies    Sleep apnea    uses  oxygen at night 2L d/t cpap did not fit   Type 2 diabetes, uncontrolled, with neuropathy 10/15/2015   Overview:  Microalbumin 7.7 12/2010: Hgb A1c 10.2% 12/2010 and 9.7% 03/2011, eye exam 10/2009 [facility] DM clinic    Urinary incontinence    Vitamin D deficiency    Past Surgical History:  Procedure Laterality Date   ABDOMINAL HYSTERECTOMY     Partial   bladder stim  2007   BLADDER SURGERY     x 2. tacking of bladder and the other was the stimulator placement   CHOLECYSTECTOMY     COLONOSCOPY WITH PROPOFOL N/A 03/04/2019   Procedure: COLONOSCOPY WITH PROPOFOL;  Surgeon: [name], MD;  Location: [facility] ENDOSCOPY;  Service: Endoscopy;  Laterality: N/A;   ESOPHAGOGASTRODUODENOSCOPY (EGD) WITH PROPOFOL N/A 03/04/2019   Procedure: ESOPHAGOGASTRODUODENOSCOPY (EGD) WITH PROPOFOL;  Surgeon: [name], MD;  Location: [facility] ENDOSCOPY;  Service: Endoscopy;  Laterality: N/A;   INCISION AND DRAINAGE ABSCESS Right 04/16/2019   Procedure: INCISION AND DRAINAGE ABSCESS;  Surgeon: Benjamine Sprague, DO;  Location: [facility] ORS;  Service: General;  Laterality: Right;   INTERSTIM-GENERATOR CHANGE N/A 12/31/2017   Procedure: INTERSTIM-GENERATOR CHANGE;  Surgeon: Bjorn Loser, MD;  Location: [facility] ORS;  Service: Urology;  Laterality: N/A;   LIVER BIOPSY  March 2016   Negative   LUNG BIOPSY  2015   small spot on lung   TUBAL LIGATION     UPPER GI ENDOSCOPY     WRIST SURGERY Right 11/02/2019   Social History:  reports that she has never smoked. She has never used smokeless tobacco. She reports that she does not drink alcohol and does not use drugs.  Allergies  Allergen Reactions   Mirtazapine Anaphylaxis    REACTION: closes throat   Morphine And Related     anaphylaxis   Sulfamethoxazole-Trimethoprim Rash    REACTION: Whelps all over   Vicodin Hp [Hydrocodone-Acetaminophen]    Amoxicillin Er Nausea And Vomiting    Pt doesn't remember problem with amoxicillin -  SE Has patient had a PCN reaction causing immediate rash, facial/tongue/throat swelling, SOB or lightheadedness with hypotension: No Has patient had a PCN reaction causing severe rash involving mucus membranes or skin necrosis: No Has patient had a PCN reaction that required hospitalization: No Has patient had a PCN reaction occurring within the last 10 years: Yes If all of the above answers are "NO", then may proceed with Cephalosporin use.   Codeine Itching    REACTION: itch, rash   Hydrocodone-Acetaminophen Rash    REACTION: rash   Keflex [Cephalexin] Rash    Pt does not remember having a reaction or rash, no airway  involvement   Prasterone Nausea And Vomiting    Family History  Problem Relation Age of Onset   Diabetes Mother    Heart disease Mother    Hyperlipidemia Mother    Hypertension Mother    Kidney disease Mother    Mental illness Mother    Hypothyroidism Mother    Stroke Mother    Osteoporosis Mother    Glaucoma Mother    Congestive Heart Failure Mother    Hypertension Father    Asthma Daughter    Cancer Daughter        throat   Bipolar disorder Daughter    Heart disease Maternal Grandfather    Rheum arthritis Maternal Grandfather    Cancer Maternal Grandfather        liver   Kidney disease Maternal Grandfather    Cancer Paternal Grandmother  lung   Kidney disease Brother    Breast cancer Maternal Grandmother 32   Bipolar disorder Daughter    Hypertension Daughter    Restless legs syndrome Daughter     Prior to Admission medications   Medication Sig Start Date End Date Taking? Authorizing Provider  atorvastatin (LIPITOR) 80 MG tablet Take 80 mg by mouth at bedtime.     [provider]  busPIRone (BUSPAR) 30 MG tablet Take 30 mg by mouth 2 (two) times daily. 11/09/20   [provider]  citalopram (CELEXA) 20 MG tablet Take 20 mg by mouth daily. 01/16/20   Care, [facility]  esomeprazole (NEXIUM) 40 MG capsule Take 1 capsule (40 mg total) by mouth in the morning. MUST SCHEDULE OFFICE VISIT 09/22/21   [name], MD  ezetimibe (ZETIA) 10 MG tablet Take 10 mg by mouth at bedtime. 01/05/17   [provider]  fenofibrate micronized (LOFIBRA) 134 MG capsule Take 134 mg by mouth at bedtime.    Morayati, Lourdes Sledge, MD  fluticasone (FLONASE) 50 MCG/ACT nasal spray Place 2 sprays into both nostrils daily. 06/30/21   Steele Sizer, MD  gabapentin (NEURONTIN) 300 MG capsule Take 300 mg by mouth 2 (two) times daily.    [provider]  gabapentin (NEURONTIN) 600 MG tablet Take 600 mg by mouth at bedtime. 06/15/20   [provider]   GLYXAMBI 25-5 MG TABS Take 1 tablet by mouth daily. 10/01/20   Morayati, Lourdes Sledge, MD  HUMULIN R U-500 KWIKPEN 500 UNIT/ML kwikpen Inject 70 Units into the skin 3 (three) times daily with meals. 01/10/21   [provider]  insulin aspart (NOVOLOG FLEXPEN) 100 UNIT/ML FlexPen Inject 3 Units subcutaneously. Sliding scale.    [provider]  lamoTRIgine (LAMICTAL) 200 MG tablet Take 200 mg by mouth 2 (two) times daily.     [provider]  lisinopril (ZESTRIL) 2.5 MG tablet Take 1 tablet (2.5 mg total) by mouth daily. 08/19/21   Steele Sizer, MD  metoprolol tartrate (LOPRESSOR) 25 MG tablet Take 1 tablet (25 mg total) by mouth 2 (two) times daily. PLEASE CALL OFFICE TO SCHEDULE AN APPOINTMENT TO CONTINUE GETTING REFILLS. 07/27/21 10/25/21  Minna Merritts, MD  nitroGLYCERIN (NITROSTAT) 0.4 MG SL tablet Place 1 tablet (0.4 mg total) under the tongue every 5 (five) minutes as needed for chest pain. 10/13/20   Minna Merritts, MD  OneTouch Delica Lancets 85I MISC by Does not apply route.    [provider]  promethazine (PHENERGAN) 12.5 MG tablet TAKE 1 TABLET BY MOUTH EVERY 6 HOURS AS NEEDED FOR NAUSEA OR VOMITING 01/14/21   [name], MD  QUEtiapine (SEROQUEL) 200 MG tablet Take 200 mg by mouth at bedtime. Along with 337m dose for a total of 5067m   [facility]  QUEtiapine (SEROQUEL) 300 MG tablet Take 300 mg by mouth at bedtime. Along with 20021mose for a total of 500m72m22/22   [facility], [facility]  SUMAtriptan (IMITREX) 100 MG tablet TAKE 1 TABLET BY MOUTH ONCE FOR 1 DOSE. MAY REPEAT IN 2 HOURS IF HEADACHE PERSISTS OR RECURS. 08/31/21   SowlSteele Sizer  SYNTHROID 100 MCG tablet Take 100 mcg by mouth daily. 10/01/20   Morayati, ShamLourdes Sledge  TRULICITY 1.5 MG/0DP/8.2UMN Inject 1.5 mg into the skin once a week. (Wednesdays) 10/01/20   MoraLenard Simmer    Physical Exam: Vitals:   10/04/21 1230 10/04/21 1411 10/04/21 1500 10/04/21  1609  BP: 128/63 (!) 104/33 (!) 119/59 122/66  Pulse: 81 78 82 87  Resp: 18 20 18  (!) 24  Temp: 98.8 F (37.1 C)     TempSrc: Oral     SpO2: 98% 99% 96% 96%  Weight: 89.4 kg     Height: 4' 11"  (1.499 m)       Physical exam:  General: Not in acute distress HEENT:       Eyes: PERRL, EOMI, no scleral icterus.       ENT: No discharge from the ears and nose, no pharynx injection, no tonsillar enlargement.        Neck: No JVD, no bruit, no mass felt. Heme: No neck lymph node enlargement. Cardiac: S1/S2, RRR, No murmurs, No gallops or rubs. Respiratory: No rales, wheezing, rhonchi or rubs. GI: Soft, nondistended, nontender, no rebound pain, no organomegaly, BS present. GU: No hematuria Ext: No pitting leg edema bilaterally. 1+DP/PT pulse bilaterally. Musculoskeletal: No joint deformities, No joint redness or warmth, no limitation of ROM in spin. Skin: No rashes.  Neuro: Alert, oriented X3, cranial nerves II-XII grossly intact, Muscle strength 5/5 in all extremities, sensation to light touch intact. Brachial reflex 2+ bilaterally.  Has poor balance. Psych: Patient is not psychotic, no suicidal or hemocidal ideation.   Assessment/Plan Principal Problem:   TIA (transient ischemic attack) Active Problems:   Type II diabetes mellitus with renal manifestations (HCC)   HTN (hypertension)   Migraine   Hypothyroid   HLD (hyperlipidemia)   COPD (chronic obstructive pulmonary disease) (HCC)   Chronic kidney disease, stage 3a (HCC)   Chronic diastolic CHF (congestive heart failure) (HCC)   CAD (coronary artery disease)   Schizoaffective disorder, bipolar type ([place])   Depression with anxiety   Obesity (BMI 35.0-39.9 without comorbidity)   Assessment and Plan: * TIA (transient ischemic attack)- (present on admission) Patient reports difficulty speaking, bilateral blurry vision, poor balance, difficulty walking.  CT of head is negative for acute intracranial abnormalities.  Likely due to  TIA, other differential diagnosis includes complex migraine and stroke.  Patient has bladder stimulator, cannot do MRI.  - Placed on MedSurg bed for observation - Obtain CTA of neck and neck, and CT-brain perfusion - will hold oral Bp meds to allow permissive HTN in the setting of acute stroke, for SBP>220 or dBP>120 - start ASA   - continue Lipitor - fasting lipid panel and HbA1c  - 2D transthoracic echocardiography  - swallowing screen. If fails, will get SLP - Check UDS  - PT/OT consult    Type II diabetes mellitus with renal manifestations ([place])- (present on admission) Recent A1c 7.7, poorly controlled.  Patient is taking Humulin R U-500 unit 3 times daily, Trulicity, NovoLog, Glyxambi -Sliding scale insulin -Humulin R-U500, 50 unit 3 times daily  HTN (hypertension)- (present on admission) - Hold home blood pressure medications to allow permissive hypertension -As needed hydralazine for SBP > 220, or dBP >120  Migraine- (present on admission) -hold home prn sumatriptan in the setting of possible stroke/TIA -As needed Tylenol -As needed benadryl   Hypothyroid- (present on admission) - Synthroid  HLD (hyperlipidemia)- (present on admission) - Lipitor, fenofibrate, Zetia  COPD (chronic obstructive pulmonary disease) ([place])- (present on admission) Stable -Bronchodilators  Chronic kidney disease, stage 3a ([place])- (present on admission) Renal function close to baseline.  Recent baseline creatinine 1.0-1.3.  Her creatinine is 1.34, BUN 25 -Monitor renal function by BMP  Chronic diastolic CHF (congestive heart failure) ([place])- (present on admission) 2D echo on  12/01/2020 showed EF 60 to 65%.  Patient does not have leg edema or JVD.  CHF seem to be compensated. -Check BNP  CAD (coronary artery disease)- (present on admission) No chest pain -Continue Lipitor, Zetia, fenofibrate -Started aspirin as above  Depression with anxiety- (present on admission) - Continue home  medications: BuSpar, Celexa, Seroquel  Schizoaffective disorder, bipolar type ([place])- (present on admission) - Continue home medications: BuSpar, Celexa, Seroquel  Obesity (BMI 35.0-39.9 without comorbidity)- (present on admission) BMI=39.79 -Diet and exercise.   -Encouraged to lose weight.           Advance Care Planning:   Code Status: Prior  full code  Consults: none  Family Communication: I called her husband  DVT ppx:   sq Lovenox      Severity of Illness: The appropriate patient status for this patient is OBSERVATION. Observation status is judged to be reasonable and necessary in order to provide the required intensity of service to ensure the patient's safety. [name] presenting symptoms, physical exam findings, and initial radiographic and laboratory data in the context of their medical condition is felt to place them at decreased risk for further clinical deterioration. Furthermore, it is anticipated that the patient will be medically stable for discharge from the hospital within 2 midnights of admission.    Author: Ivor Costa, MD 10/04/2021 6:47 PM  For on call review [email or web address].

## 2021-10-04 NOTE — Assessment & Plan Note (Addendum)
-  hold home prn sumatriptan in the setting of possible stroke/TIA -As needed Tylenol -As needed benadryl

## 2021-10-04 NOTE — ED Provider Notes (Signed)
[facility] Provider Note    Event Date/Time   First MD Initiated Contact with Patient 10/04/21 1342     (approximate)   History   Headache and Aphasia   HPI  [name] is a 56 y.o. female patient reports a headache 8 out of 10 started yesterday.  She said she is walking funny and sliding her feet funny.  She says she is having word finding difficulty.  She is not falling.  She has a history of migraines.  Patient reports she has had headaches like the current one before without difficulty.  She did not tell me she had any history of slurry speech.  She is not having any difficulty with balance and did not tell me that either.  In the emergency room patient walks normally for me and speaks normally for me as well.      Physical Exam   Triage Vital Signs: ED Triage Vitals [10/04/21 1230]  Enc Vitals Group     BP 128/63     Pulse Rate 81     Resp 18     Temp 98.8 F (37.1 C)     Temp Source Oral     SpO2 98 %     Weight 197 lb (89.4 kg)     Height 4' 11"  (1.499 m)     Head Circumference      Peak Flow      Pain Score 8     Pain Loc      Pain Edu?      Excl. in [place]?     Most recent vital signs: Vitals:   10/04/21 1500 10/04/21 1609  BP: (!) 119/59 122/66  Pulse: 82 87  Resp: 18 (!) 24  Temp:    SpO2: 96% 96%    { General: Awake, alert, no distress.  Head normocephalic/atraumatic Eyes pupils equal round react to light and accommodation extraocular movements intact fundi appear normal CV:  Good peripheral perfusion.  Heart regular rate and rhythm no audible murmurs Resp:  Normal effort.  Lungs are clear Abd:  No distention.  Abdomen soft and nontender Neuro patient speaking without any difficulty.  Had a little bit of hesitancy finding 1 word to say and otherwise doing well.  She walked without any difficulty.  Cranial nerves II through XII are intact although visual fields were not checked.  Finger-to-nose appeared to be normal  bilaterally.  Motor strength was 5/5 throughout and patient did not report any numbness.   ED Results / Procedures / Treatments   Labs (all labs ordered are listed, but only abnormal results are displayed) Labs Reviewed  APTT - Abnormal; Notable for the following components:      Result Value   aPTT <20 (*)    All other components within normal limits  CBC - Abnormal; Notable for the following components:   Hemoglobin 11.7 (*)    All other components within normal limits  COMPREHENSIVE METABOLIC PANEL - Abnormal; Notable for the following components:   Glucose, Bld 152 (*)    BUN 25 (*)    Creatinine, Ser 1.34 (*)    Calcium 8.6 (*)    Total Protein 5.7 (*)    Albumin 3.4 (*)    AST 48 (*)    GFR, Estimated 46 (*)    All other components within normal limits  PROTIME-INR  DIFFERENTIAL     EKG  EKG read interpreted by me shows normal sinus rhythm at a  rate of 79 left axis decreased R wave progression its new from May 22 but was present in March 2022   RADIOLOGY CT of the head read by radiology reviewed by me is negative.   PROCEDURES:  Critical Care performed:   Procedures   MEDICATIONS ORDERED IN ED: Medications  sodium chloride flush (NS) 0.9 % injection 3 mL (3 mLs Intravenous Given 10/04/21 1506)  metoCLOPramide (REGLAN) injection 10 mg (10 mg Intravenous Given 10/04/21 1506)  sodium chloride 0.9 % bolus 1,000 mL (1,000 mLs Intravenous New Bag/Given 10/04/21 1609)     IMPRESSION / MDM / ASSESSMENT AND PLAN / ED COURSE  I reviewed the triage vital signs and the nursing notes. Patient reports her headache is not the worst of her life and she has had headaches like this before.  She is wondering if she could be dehydrated.  Her BUN and creatinine are somewhat elevated and GFR is somewhat lower than previously.  She has had values in the same range in the past.  I will give her some Reglan to see if this helps with the headache as she has had migraines in the past.   I will give her some fluids and see how she does.  I discussed her in detail with neurology, Dr. Quinn Axe patient does not appear to have any acute neurological problem currently and I am not sure that she did earlier either.  We will see how she does with the fluids.  The patient is on the cardiac monitor to evaluate for evidence of arrhythmia and/or significant heart rate changes. ----------------------------------------- 4:25 PM on 10/04/2021 ----------------------------------------- I discussed the patient with her husband.  He reports that yesterday she had unintelligible speech for several hours.  It went away this morning.  This sounds like a TIA at least.  We will have to get her admitted contrary to what I had thought.  Patient's headache is better.  Clinical Course as of 10/04/21 1625  Tue Oct 04, 2021  1556 APTT(!): <20 [SH]    Clinical Course User Index [SH] Alwyn Ren, Student-PA     FINAL CLINICAL IMPRESSION(S) / ED DIAGNOSES   Final diagnoses:  Nonintractable headache, unspecified chronicity pattern, unspecified headache type  TIA (transient ischemic attack)     Rx / DC Orders   ED Discharge Orders     None        Note:  This document was prepared using Dragon voice recognition software and may include unintentional dictation errors.   Nena Polio, MD 10/04/21 [phone]

## 2021-10-04 NOTE — Assessment & Plan Note (Signed)
Recent A1c 7.7, poorly controlled.  Patient is taking Humulin R U-500 unit 3 times daily, Trulicity, NovoLog, Glyxambi -Sliding scale insulin -Humulin R-U500, 50 unit 3 times daily

## 2021-10-04 NOTE — Assessment & Plan Note (Signed)
-   Continue home medications: BuSpar, Celexa, Seroquel

## 2021-10-04 NOTE — ED Triage Notes (Signed)
Pt to ED POV for slurred speech and headache that started at 1600 yesterday. Also reports headache that started yesterday afternoon. Altered balance that started today, no falls.  Pt oriented.  Hx migraine

## 2021-10-04 NOTE — Assessment & Plan Note (Signed)
-   Lipitor, fenofibrate, Zetia

## 2021-10-04 NOTE — Assessment & Plan Note (Signed)
2D echo on 12/01/2020 showed EF 60 to 65%.  Patient does not have leg edema or JVD.  CHF seem to be compensated. -Check BNP

## 2021-10-04 NOTE — Assessment & Plan Note (Signed)
Synthroid 

## 2021-10-04 NOTE — Assessment & Plan Note (Signed)
Stable -Bronchodilators 

## 2021-10-04 NOTE — Assessment & Plan Note (Signed)
Renal function close to baseline.  Recent baseline creatinine 1.0-1.3.  Her creatinine is 1.34, BUN 25 -Monitor renal function by BMP

## 2021-10-04 NOTE — ED Provider Notes (Deleted)
I think it is patient approximately 1500.  Please call providers note for full details regarding patient's initial evaluation assessment.  In brief patient presents for evaluation of headache reporting some speech difficulties and difficulties with balance.  However prior to handoff she was observed to ambulate with steady gait around the emergency room and noted to have normal speech.  She was given some Reglan and on my reassessment is sleeping but easily awakens and states she is feeling much better.  Her speech is normal.  Suspect possible atypical migraine.  CT head and labs including CBC and CMP grossly unremarkable.  I think this point patient is stable for discharge given improvement in perception of symptoms and overall well appearance.  Low suspicion for CVA.  Discharged in stable condition.   Lucrezia Starch, MD 10/04/21 [phone]

## 2021-10-04 NOTE — Assessment & Plan Note (Signed)
No chest pain -Continue Lipitor, Zetia, fenofibrate -Started aspirin as above

## 2021-10-04 NOTE — Assessment & Plan Note (Signed)
BMI=39.79 -Diet and exercise.   -Encouraged to lose weight.

## 2021-10-04 NOTE — Telephone Encounter (Signed)
°  Chief Complaint: neurological changes Symptoms: dizziness, vision changes, slurred speech, headache, shuffling feet Frequency: constant for 3 days- seems to be getting worse Pertinent Negatives: Patient denies chest pain, difficulty breathing Disposition: [x] ED /[] Urgent Care (no appt availability in office) / [] Appointment(In office/virtual)/ []  [facility]/ [] Home Care/ [] Refused Recommended Disposition /[] [place]/ []  Follow-up with PCP Additional Notes: Patient advised ED now- call 911 if symptoms get worse

## 2021-10-04 NOTE — Assessment & Plan Note (Addendum)
Patient reports difficulty speaking, bilateral blurry vision, poor balance, difficulty walking.  CT of head is negative for acute intracranial abnormalities.  Likely due to TIA, other differential diagnosis includes complex migraine and stroke.  Patient has bladder stimulator, cannot do MRI.  - Placed on MedSurg bed for observation - Obtain CTA of neck and neck, and CT-brain perfusion - will hold oral Bp meds to allow permissive HTN in the setting of acute stroke, for SBP>220 or dBP>120 - start ASA   - continue Lipitor - fasting lipid panel and HbA1c  - 2D transthoracic echocardiography  - swallowing screen. If fails, will get SLP - Check UDS  - PT/OT consult

## 2021-10-04 NOTE — Telephone Encounter (Signed)
Reason for Disposition  [1] Loss of speech or garbled speech AND [2] sudden onset AND [3] present now  Answer Assessment - Initial Assessment Questions 1. SYMPTOM: "What is the main symptom you are concerned about?" (e.g., weakness, numbness)     Dizzy, slurring words, sleepy, shuffling feet with walking 2. ONSET: "When did this start?" (minutes, hours, days; while sleeping)     3 days 3. LAST NORMAL: "When was the last time you (the patient) were normal (no symptoms)?"     4 days ago 4. PATTERN "Does this come and go, or has it been constant since it started?"  "Is it present now?"     Constant- present now 5. CARDIAC SYMPTOMS: "Have you had any of the following symptoms: chest pain, difficulty breathing, palpitations?"     no 6. NEUROLOGIC SYMPTOMS: "Have you had any of the following symptoms: headache, dizziness, vision loss, double vision, changes in speech, unsteady on your feet?"     Headache, vision changes 7. OTHER SYMPTOMS: "Do you have any other symptoms?"     *No Answer* 8. PREGNANCY: "Is there any chance you are pregnant?" "When was your last menstrual period?"     *No Answer*  Protocols used: Neurologic Deficit-A-AH

## 2021-10-05 ENCOUNTER — Observation Stay (HOSPITAL_BASED_OUTPATIENT_CLINIC_OR_DEPARTMENT_OTHER)
Admit: 2021-10-05 | Discharge: 2021-10-05 | Disposition: A | Discharge status: Home / Self Care | Payer: Medicare Other | Attending: Internal Medicine | Admitting: Internal Medicine

## 2021-10-05 ENCOUNTER — Observation Stay: Payer: Medicare Other

## 2021-10-05 DIAGNOSIS — G459 Transient cerebral ischemic attack, unspecified: Secondary | ICD-10-CM

## 2021-10-05 LAB — ECHOCARDIOGRAM COMPLETE
AR max vel: 3.29 cm2
AV Area VTI: 3.7 cm2
AV Area mean vel: 3.25 cm2
AV Mean grad: 5 mmHg
AV Peak grad: 9.2 mmHg
Ao pk vel: 1.52 m/s
Area-P 1/2: 9.25 cm2
Height: 59 in
MV VTI: 5.64 cm2
S' Lateral: 3.19 cm
Weight: 3152 oz

## 2021-10-05 LAB — LIPID PANEL
Cholesterol: 80 mg/dL (ref 0–200)
HDL: 23 mg/dL — ABNORMAL LOW (ref >40)
LDL Cholesterol: 11 mg/dL (ref 0–99)
Total CHOL/HDL Ratio: 3.5 RATIO
Triglycerides: 228 mg/dL — ABNORMAL HIGH (ref <150)
VLDL: 46 mg/dL — ABNORMAL HIGH (ref 0–40)

## 2021-10-05 LAB — CBG MONITORING, ED
Glucose-Capillary: 190 mg/dL — ABNORMAL HIGH (ref 70–99)
Glucose-Capillary: 327 mg/dL — ABNORMAL HIGH (ref 70–99)

## 2021-10-05 LAB — D-DIMER, QUANTITATIVE: D-Dimer, Quant: 0.47 ug/mL-FEU (ref 0.00–0.50)

## 2021-10-05 MED ORDER — SUMATRIPTAN SUCCINATE 50 MG PO TABS
50.0000 mg | ORAL_TABLET | Freq: Once | ORAL | Status: AC
  Administered 2021-10-05: 50 mg via ORAL
  Filled 2021-10-05: qty 1

## 2021-10-05 MED ORDER — ASPIRIN 81 MG PO TBEC: 81.0000 mg | DELAYED_RELEASE_TABLET | Freq: Every day | ORAL | Status: DC

## 2021-10-05 MED ORDER — PERFLUTREN LIPID MICROSPHERE
1.0000 mL | INTRAVENOUS | Status: AC | PRN
  Administered 2021-10-05: 2 mL via INTRAVENOUS
  Filled 2021-10-05: qty 10

## 2021-10-05 MED ORDER — INSULIN REGULAR HUMAN (CONC) 500 UNIT/ML ~~LOC~~ SOPN
50.0000 [IU] | PEN_INJECTOR | Freq: Three times a day (TID) | SUBCUTANEOUS | Status: DC
  Administered 2021-10-05: 50 [IU] via SUBCUTANEOUS
  Filled 2021-10-05: qty 3

## 2021-10-05 NOTE — ED Notes (Signed)
Pt up to use bedside commode

## 2021-10-05 NOTE — Progress Notes (Signed)
SLP Cancellation Note ° °Patient Details °Name: [name] °MRN: [id number] °DOB: 02/25/1966 ° ° °Cancelled treatment:       Reason Eval/Treat Not Completed: SLP screened, no needs identified, will sign off (chart reviewed; consulted NSG then met w/ pt in room) °Pt denied any difficulty swallowing and is currently on a regular diet; tolerates swallowing pills w/ water per NSG. Pt conversed in conversation w/out expressive/receptive deficits noted; pt denied any speech-language deficits. Speech clear and intelligible during conversation; Family in room. °No further skilled ST services indicated as pt appears at her baseline. Pt agreed. NSG to reconsult if any change in status while admitted.   ° ° ° ° , MS, CCC-SLP °Speech Language Pathologist °Rehab Services; [facility] -  °[phone] (ascom) °, °10/05/2021, 11:55 AM °

## 2021-10-05 NOTE — Progress Notes (Signed)
OT Screen Note  Patient Details Name: [name] MRN: [id number] DOB: 05-Feb-1966   Cancelled Treatment:    Reason Eval/Treat Not Completed: OT screened, no needs identified, will sign off. OT order received. Chart reviewed. Spoke with physical therapist who saw pt just prior to OT. Per physical therapist, pt is at baseline level of functional independence for ADL/IADL management. Pt also denies functional deficit at this time. She endorses all symptoms have resolved aside from headache which is her baseline. She declines OT evaluation at this time. No skilled OT needs identified. Will sign of. Please re-consult if additional OT needs arise during this admission.   Shara Blazing, M.S., OTR/L [facility] - [facility] Ascom: [phone] 10/05/21, 9:38 AM

## 2021-10-05 NOTE — ED Notes (Signed)
Informed RN bed assigned 

## 2021-10-05 NOTE — Discharge Summary (Signed)
Physician Discharge Summary   [name]  female DOB: July 09, 1966  XKP:[id number]  PCP: Steele Sizer, MD  Admit date: 10/04/2021 Discharge date: 10/05/2021  Admitted From: home Disposition:  home CODE STATUS: Full code   Hospital Course:  For full details, please see H&P, progress notes, consult notes and ancillary notes.  Briefly,  [name] is a 56 y.o. female with medical history significant of hypertension, hyperlipidemia, diabetes mellitus, COPD on 2 L oxygen at night, asthma, hypothyroidism, depression with anxiety, bipolar disorder, OSA not on CPAP, PVD, obesity, CAD, dCHF, migraine headache, CKD-3A, who presented with difficulty speaking, blurry vision, poor balance.  Patient states that she has a bladder stimulator in place, and was told that she cannot do MRI.  * TIA (transient ischemic attack)- (present on admission) Patient reports difficulty speaking, bilateral blurry vision, poor balance, difficulty walking.  CT of head showed No acute intracranial pathology and Mild small-vessel white matter disease.  Likely due to TIA.  All symptoms resolved prior to discharge. --start pt on ASA 81 mg daily --cont home lipitor, Zetia and fenofibrate   Type II diabetes mellitus, poorly controlled with renal manifestations ([place])- (present on admission) A1c 8.3 --discharge on home regimen as below   HTN (hypertension)- (present on admission) --resume home Lisinopril and Lopressor after discharge   Migraine- (present on admission) -cont home prn sumatriptan     Hypothyroid- (present on admission) - Synthroid   HLD (hyperlipidemia)- (present on admission) - LDL 11. --cont home Lipitor, fenofibrate, Zetia   COPD (chronic obstructive pulmonary disease) ([place])- (present on admission) Stable   Chronic kidney disease, stage 3a ([place])- (present on admission) Renal function close to baseline.  Recent baseline creatinine 1.0-1.3.  Her creatinine is 1.34, BUN 25   Chronic  diastolic CHF (congestive heart failure) ([place])- (present on admission) 2D echo on 12/01/2020 showed EF 60 to 65%.  Patient does not have leg edema or JVD.  CHF seem to be compensated.   CAD (coronary artery disease)- (present on admission) No chest pain -Continue Lipitor, Zetia, fenofibrate -Started aspirin as above   Depression with anxiety- (present on admission) - Continue home medications: BuSpar, Celexa, Seroquel   Schizoaffective disorder, bipolar type ([place])- (present on admission) - Continue home Lamictal   Obesity (BMI 35.0-39.9 without comorbidity)- (present on admission) BMI=39.79 -Diet and exercise.   -Encouraged to lose weight.     Discharge Diagnoses:  Principal Problem:   TIA (transient ischemic attack) Active Problems:   Hypothyroid   Migraine   HLD (hyperlipidemia)   Obesity (BMI 35.0-39.9 without comorbidity)   Schizoaffective disorder, bipolar type (HCC)   Type II diabetes mellitus with renal manifestations (HCC)   HTN (hypertension)   Chronic kidney disease, stage 3a (HCC)   Depression with anxiety   COPD (chronic obstructive pulmonary disease) (HCC)   CAD (coronary artery disease)   Chronic diastolic CHF (congestive heart failure) ([place])     Discharge Instructions:  Allergies as of 10/05/2021       Reactions   Mirtazapine Anaphylaxis   REACTION: closes throat   Morphine And Related    anaphylaxis   Sulfamethoxazole-trimethoprim Rash   REACTION: Whelps all over   Vicodin Hp [hydrocodone-acetaminophen]    Amoxicillin Er Nausea And Vomiting   Pt doesn't remember problem with amoxicillin -  SE Has patient had a PCN reaction causing immediate rash, facial/tongue/throat swelling, SOB or lightheadedness with hypotension: No Has patient had a PCN reaction causing severe rash involving mucus membranes or skin necrosis:  No Has patient had a PCN reaction that required hospitalization: No Has patient had a PCN reaction occurring within the last 10 years:  Yes If all of the above answers are "NO", then may proceed with Cephalosporin use.   Codeine Itching   REACTION: itch, rash   Hydrocodone-acetaminophen Rash   REACTION: rash   Keflex [cephalexin] Rash   Pt does not remember having a reaction or rash, no airway involvement   Prasterone Nausea And Vomiting        Medication List     STOP taking these medications    NovoLOG FlexPen 100 UNIT/ML FlexPen Generic drug: insulin aspart       TAKE these medications    aspirin 81 MG EC tablet Take 1 tablet (81 mg total) by mouth daily. Swallow whole. Start taking on: October 06, 2021   atorvastatin 80 MG tablet Commonly known as: LIPITOR Take 80 mg by mouth at bedtime.   busPIRone 30 MG tablet Commonly known as: BUSPAR Take 30 mg by mouth 2 (two) times daily.   citalopram 20 MG tablet Commonly known as: CELEXA Take 20 mg by mouth daily.   esomeprazole 40 MG capsule Commonly known as: NEXIUM Take 1 capsule (40 mg total) by mouth in the morning. MUST SCHEDULE OFFICE VISIT   ezetimibe 10 MG tablet Commonly known as: ZETIA Take 10 mg by mouth at bedtime.   fenofibrate micronized 134 MG capsule Commonly known as: LOFIBRA Take 134 mg by mouth at bedtime.   fluticasone 50 MCG/ACT nasal spray Commonly known as: FLONASE Place 2 sprays into both nostrils daily.   gabapentin 300 MG capsule Commonly known as: NEURONTIN Take 300 mg by mouth 2 (two) times daily.   gabapentin 600 MG tablet Commonly known as: NEURONTIN Take 600 mg by mouth at bedtime.   Glyxambi 25-5 MG Tabs Generic drug: Empagliflozin-linaGLIPtin Take 1 tablet by mouth daily.   HumuLIN R U-500 KwikPen 500 UNIT/ML KwikPen Generic drug: insulin regular human CONCENTRATED Inject 70 Units into the skin 3 (three) times daily with meals.   lamoTRIgine 200 MG tablet Commonly known as: LAMICTAL Take 200 mg by mouth 2 (two) times daily.   lisinopril 2.5 MG tablet Commonly known as: ZESTRIL Take 1 tablet  (2.5 mg total) by mouth daily.   metoprolol tartrate 25 MG tablet Commonly known as: LOPRESSOR Take 1 tablet (25 mg total) by mouth 2 (two) times daily. PLEASE CALL OFFICE TO SCHEDULE AN APPOINTMENT TO CONTINUE GETTING REFILLS.   nitroGLYCERIN 0.4 MG SL tablet Commonly known as: NITROSTAT Place 1 tablet (0.4 mg total) under the tongue every 5 (five) minutes as needed for chest pain.   OneTouch Delica Lancets 26S Misc by Does not apply route.   promethazine 12.5 MG tablet Commonly known as: PHENERGAN TAKE 1 TABLET BY MOUTH EVERY 6 HOURS AS NEEDED FOR NAUSEA OR VOMITING   QUEtiapine 200 MG tablet Commonly known as: SEROQUEL Take 200 mg by mouth at bedtime. Along with 321m dose for a total of 5094m  QUEtiapine 300 MG tablet Commonly known as: SEROQUEL Take 300 mg by mouth at bedtime. Along with 20029mose for a total of 500m23mSUMAtriptan 100 MG tablet Commonly known as: IMITREX TAKE 1 TABLET BY MOUTH ONCE FOR 1 DOSE. MAY REPEAT IN 2 HOURS IF HEADACHE PERSISTS OR RECURS.   Synthroid 100 MCG tablet Generic drug: levothyroxine Take 100 mcg by mouth daily.   Trulicity 1.5 MG/0TM/1.9QQn Generic drug: Dulaglutide Inject 1.5 mg into the  skin once a week. (Wednesdays)         Follow-up Information     Steele Sizer, MD. Schedule an appointment as soon as possible for a visit in 1 week(s).   Specialty: Family Medicine Contact information: [address] [place] [address] [phone]                 Allergies  Allergen Reactions   Mirtazapine Anaphylaxis    REACTION: closes throat   Morphine And Related     anaphylaxis   Sulfamethoxazole-Trimethoprim Rash    REACTION: Whelps all over   Vicodin Hp [Hydrocodone-Acetaminophen]    Amoxicillin Er Nausea And Vomiting    Pt doesn't remember problem with amoxicillin -  SE Has patient had a PCN reaction causing immediate rash, facial/tongue/throat swelling, SOB or lightheadedness with  hypotension: No Has patient had a PCN reaction causing severe rash involving mucus membranes or skin necrosis: No Has patient had a PCN reaction that required hospitalization: No Has patient had a PCN reaction occurring within the last 10 years: Yes If all of the above answers are "NO", then may proceed with Cephalosporin use.   Codeine Itching    REACTION: itch, rash   Hydrocodone-Acetaminophen Rash    REACTION: rash   Keflex [Cephalexin] Rash    Pt does not remember having a reaction or rash, no airway involvement   Prasterone Nausea And Vomiting     The results of significant diagnostics from this hospitalization (including imaging, microbiology, ancillary and laboratory) are listed below for reference.   Consultations:   Procedures/Studies: CT HEAD WO CONTRAST  Result Date: 10/04/2021 CLINICAL DATA:  Slurred speech EXAM: CT HEAD WITHOUT CONTRAST TECHNIQUE: Contiguous axial images were obtained from the base of the skull through the vertex without intravenous contrast. RADIATION DOSE REDUCTION: This exam was performed according to the departmental dose-optimization program which includes automated exposure control, adjustment of the mA and/or kV according to patient size and/or use of iterative reconstruction technique. COMPARISON:  05/18/2020 FINDINGS: Brain: No evidence of acute infarction, hemorrhage, hydrocephalus, extra-axial collection or mass lesion/mass effect. Mild periventricular white matter hypodensity. Vascular: No hyperdense vessel or unexpected calcification. Skull: Normal. Negative for fracture or focal lesion. Sinuses/Orbits: No acute finding. Other: None. IMPRESSION: No acute intracranial pathology. Mild small-vessel white matter disease Electronically Signed   By: Delanna Ahmadi M.D.   On: 10/04/2021 12:58   ECHOCARDIOGRAM COMPLETE  Result Date: 10/05/2021    ECHOCARDIOGRAM REPORT   Patient Name:   PAKOU RAINBOW Bennetts Date of Exam: 10/05/2021 Medical Rec #:  [id number]       Height:       59.0 in Accession #:    [id number]     Weight:       197.0 lb Date of Birth:  1966/01/24       BSA:          1.833 m Patient Age:    56 years       BP:           152/64 mmHg Patient Gender: F              HR:           101 bpm. Exam Location:  [facility] Procedure: 2D Echo, Color Doppler, Cardiac Doppler and Intracardiac            Opacification Agent Indications:     G45.9 TIA  History:         Patient has prior history  of Echocardiogram examinations, most                  recent 12/01/2020. CHF, CAD, CKD, PVD, Signs/Symptoms:Headache,                  slurred speech; Risk Factors:Hypertension, Diabetes,                  Dyslipidemia and Sleep Apnea.  Sonographer:     Charmayne Sheer Referring Phys:  [id number] Ivor Costa Diagnosing Phys: Kate Sable MD  Sonographer Comments: Technically difficult study due to poor echo windows. Image acquisition challenging due to patient body habitus. IMPRESSIONS  1. Left ventricular ejection fraction, by estimation, is 60 to 65%. The left ventricle has normal function. The left ventricle has no regional wall motion abnormalities. Left ventricular diastolic parameters are consistent with Grade I diastolic dysfunction (impaired relaxation).  2. Right ventricular systolic function is normal. The right ventricular size is normal.  3. The mitral valve is normal in structure. No evidence of mitral valve regurgitation.  4. The aortic valve is tricuspid. Aortic valve regurgitation is not visualized.  5. The inferior vena cava is normal in size with greater than 50% respiratory variability, suggesting right atrial pressure of 3 mmHg. FINDINGS  Left Ventricle: Left ventricular ejection fraction, by estimation, is 60 to 65%. The left ventricle has normal function. The left ventricle has no regional wall motion abnormalities. Definity contrast agent was given IV to delineate the left ventricular  endocardial borders. The left ventricular internal cavity size was normal in size. There is no  left ventricular hypertrophy. Left ventricular diastolic parameters are consistent with Grade I diastolic dysfunction (impaired relaxation). Right Ventricle: The right ventricular size is normal. No increase in right ventricular wall thickness. Right ventricular systolic function is normal. Left Atrium: Left atrial size was normal in size. Right Atrium: Right atrial size was normal in size. Pericardium: There is no evidence of pericardial effusion. Mitral Valve: The mitral valve is normal in structure. No evidence of mitral valve regurgitation. MV peak gradient, 7.7 mmHg. The mean mitral valve gradient is 3.0 mmHg. Tricuspid Valve: The tricuspid valve is not well visualized. Tricuspid valve regurgitation is not demonstrated. Aortic Valve: The aortic valve is tricuspid. Aortic valve regurgitation is not visualized. Aortic valve mean gradient measures 5.0 mmHg. Aortic valve peak gradient measures 9.2 mmHg. Aortic valve area, by VTI measures 3.70 cm. Pulmonic Valve: The pulmonic valve was not well visualized. Pulmonic valve regurgitation is not visualized. Aorta: The aortic root and ascending aorta are structurally normal, with no evidence of dilitation. Venous: The inferior vena cava is normal in size with greater than 50% respiratory variability, suggesting right atrial pressure of 3 mmHg. IAS/Shunts: No atrial level shunt detected by color flow Doppler.  LEFT VENTRICLE PLAX 2D LVIDd:         4.70 cm   Diastology LVIDs:         3.19 cm   LV e' medial:    8.05 cm/s LV PW:         0.96 cm   LV E/e' medial:  7.8 LV IVS:        0.82 cm   LV e' lateral:   11.10 cm/s LVOT diam:     2.50 cm   LV E/e' lateral: 5.7 LV SV:         99 LV SV Index:   54 LVOT Area:     4.91 cm  RIGHT VENTRICLE RV Basal diam:  3.06 cm LEFT ATRIUM           Index        RIGHT ATRIUM          Index LA diam:      3.20 cm 1.75 cm/m   RA Area:     8.40 cm LA Vol (A2C): 28.3 ml 15.44 ml/m  RA Volume:   14.30 ml 7.80 ml/m LA Vol (A4C): 36.3 ml  19.81 ml/m  AORTIC VALVE                     PULMONIC VALVE AV Area (Vmax):    3.29 cm      PV Vmax:       0.99 m/s AV Area (Vmean):   3.25 cm      PV Vmean:      68.800 cm/s AV Area (VTI):     3.70 cm      PV VTI:        0.158 m AV Vmax:           152.00 cm/s   PV Peak grad:  3.9 mmHg AV Vmean:          108.000 cm/s  PV Mean grad:  2.0 mmHg AV VTI:            0.268 m AV Peak Grad:      9.2 mmHg AV Mean Grad:      5.0 mmHg LVOT Vmax:         102.00 cm/s LVOT Vmean:        71.500 cm/s LVOT VTI:          0.202 m LVOT/AV VTI ratio: 0.75  AORTA Ao Root diam: 3.20 cm MITRAL VALVE MV Area (PHT): 9.25 cm    SHUNTS MV Area VTI:   5.64 cm    Systemic VTI:  0.20 m MV Peak grad:  7.7 mmHg    Systemic Diam: 2.50 cm MV Mean grad:  3.0 mmHg MV Vmax:       1.39 m/s MV Vmean:      74.0 cm/s MV Decel Time: 82 msec MV E velocity: 63.00 cm/s MV A velocity: 95.10 cm/s MV E/A ratio:  0.66 Kate Sable MD Electronically signed by Kate Sable MD Signature Date/Time: 10/05/2021/3:31:15 PM    Final    CT ANGIO HEAD NECK W WO CM W PERF (CODE STROKE)  Result Date: 10/04/2021 CLINICAL DATA:  Slurred speech, headache, difficulty walking EXAM: CT ANGIOGRAPHY HEAD AND NECK TECHNIQUE: Multidetector CT imaging of the head and neck was performed using the standard protocol during bolus administration of intravenous contrast. Multiplanar CT image reconstructions and MIPs were obtained to evaluate the vascular anatomy. Carotid stenosis measurements (when applicable) are obtained utilizing NASCET criteria, using the distal internal carotid diameter as the denominator. RADIATION DOSE REDUCTION: This exam was performed according to the departmental dose-optimization program which includes automated exposure control, adjustment of the mA and/or kV according to patient size and/or use of iterative reconstruction technique. CONTRAST:  119m OMNIPAQUE IOHEXOL 350 MG/ML SOLN COMPARISON:  No prior CTA, correlation is made with CT head  10/04/2021 and 05/18/2020 FINDINGS: CT HEAD FINDINGS For noncontrast findings, please see same day CT head. CTA NECK FINDINGS Aortic arch: Two-vessel arch with a common origin of the brachiocephalic and left common carotid arteries. Imaged portion shows no evidence of aneurysm or dissection. No significant stenosis of the major arch vessel origins. Mild aortic atherosclerosis. Right carotid system: No evidence of dissection,  stenosis (50% or greater) or occlusion. Left carotid system: No evidence of dissection, stenosis (50% or greater) or occlusion. Vertebral arteries: Codominant. No evidence of dissection, stenosis (50% or greater) or occlusion. Skeleton: No acute osseous abnormality. Other neck: Negative. Upper chest: Main pulmonary artery is at the upper limit of normal, as can be seen in the setting of pulmonary hypertension. Although this study is not timed to evaluate for pulmonary emboli, there is a possible filling defect in a right upper lobe subsegmental pulmonary artery (series 7, image 278), concerning for thrombus. No focal pulmonary opacity or pleural effusion. Review of the MIP images confirms the above findings CTA HEAD FINDINGS Anterior circulation: Both internal carotid arteries are patent to the termini, without stenosis or other abnormality. A1 segments patent. Normal anterior communicating artery. Anterior cerebral arteries are patent to their distal aspects. No M1 stenosis or occlusion. Normal MCA bifurcations. Distal MCA branches perfused and symmetric. Posterior circulation: Vertebral arteries patent to the vertebrobasilar junction without stenosis. Posterior inferior cerebral arteries patent bilaterally. Basilar patent to its distal aspect. Superior cerebellar arteries patent bilaterally. Patent right P1, with nonvisualization of the right posterior communicating artery. Hypoplastic left P1, with a patent left posterior communicating artery and near fetal origin of the left PCA. PCAs  perfused to their distal aspects without stenosis. Venous sinuses: As permitted by contrast timing, patent. Anatomic variants: None significant Review of the MIP images confirms the above findings IMPRESSION: 1.  No intracranial large vessel occlusion or significant stenosis. 2.  No hemodynamically significant stenosis in the neck. 3. Although this study is not timed to evaluate for pulmonary emboli, there is a possible filling defect in a right upper lobe subsegmental pulmonary artery, concerning for thrombus. Consider CTA of the chest for further evaluation. 4. Main pulmonary artery diameter at the upper limit of normal, which can be seen in the setting of pulmonary hypertension. These results were called by telephone at the time of interpretation on 10/04/2021 at 9:10 pm to provider Jerrye Bushy, who verbally acknowledged these results. Electronically Signed   By: Merilyn Baba M.D.   On: 10/04/2021 21:11      Labs: BNP (last 3 results) Recent Labs    10/04/21 1259  BNP 8.7   Basic Metabolic Panel: Recent Labs  Lab 10/04/21 1229  NA 139  K 4.7  CL 108  CO2 23  GLUCOSE 152*  BUN 25*  CREATININE 1.34*  CALCIUM 8.6*   Liver Function Tests: Recent Labs  Lab 10/04/21 1229  AST 48*  ALT 40  ALKPHOS 46  BILITOT 0.9  PROT 5.7*  ALBUMIN 3.4*   No results for input(s): LIPASE, AMYLASE in the last 168 hours. No results for input(s): AMMONIA in the last 168 hours. CBC: Recent Labs  Lab 10/04/21 1229  WBC 6.0  NEUTROABS 3.1  HGB 11.7*  HCT 38.4  MCV 97.2  PLT 213   Cardiac Enzymes: No results for input(s): CKTOTAL, CKMB, CKMBINDEX, TROPONINI in the last 168 hours. BNP: Invalid input(s): POCBNP CBG: Recent Labs  Lab 10/04/21 1932 10/05/21 0731 10/05/21 1133  GLUCAP 138* 190* 327*   D-Dimer Recent Labs    10/05/21 1541  DDIMER 0.47   Hgb A1c No results for input(s): HGBA1C in the last 72 hours. Lipid Profile Recent Labs    10/05/21 0641  CHOL 80  HDL 23*   LDLCALC 11  TRIG 228*  CHOLHDL 3.5   Thyroid function studies No results for input(s): TSH, T4TOTAL, T3FREE, THYROIDAB in the last  72 hours.  Invalid input(s): FREET3 Anemia work up No results for input(s): VITAMINB12, FOLATE, FERRITIN, TIBC, IRON, RETICCTPCT in the last 72 hours. Urinalysis    Component Value Date/Time   COLORURINE YELLOW (A) 01/25/2021 1053   APPEARANCEUR Hazy (A) 09/19/2021 0937   LABSPEC 1.027 01/25/2021 1053   LABSPEC 1.026 10/09/2014 1808   PHURINE 5.0 01/25/2021 1053   GLUCOSEU 2+ (A) 09/19/2021 0937   GLUCOSEU >=500 10/09/2014 1808   HGBUR NEGATIVE 01/25/2021 1053   HGBUR moderate 01/04/2009 0913   BILIRUBINUR Negative 09/19/2021 0937   BILIRUBINUR Negative 10/09/2014 [place] 01/25/2021 1053   PROTEINUR Negative 09/19/2021 0937   PROTEINUR NEGATIVE 01/25/2021 1053   UROBILINOGEN 0.2 04/18/2021 1025   UROBILINOGEN 0.2 01/04/2009 0913   NITRITE Negative 09/19/2021 0937   NITRITE NEGATIVE 01/25/2021 1053   LEUKOCYTESUR Negative 09/19/2021 0937   LEUKOCYTESUR NEGATIVE 01/25/2021 1053   LEUKOCYTESUR Negative 10/09/2014 1808   Sepsis Labs Invalid input(s): PROCALCITONIN,  WBC,  LACTICIDVEN Microbiology Recent Results (from the past 240 hour(s))  Resp Panel by RT-PCR (Flu A&B, Covid) Nasopharyngeal Swab     Status: None   Collection Time: 10/04/21  6:02 PM   Specimen: Nasopharyngeal Swab; Nasopharyngeal(NP) swabs in vial transport medium  Result Value Ref Range Status   SARS Coronavirus 2 by RT PCR NEGATIVE NEGATIVE Final    Comment: (NOTE) SARS-CoV-2 target nucleic acids are NOT DETECTED.  The SARS-CoV-2 RNA is generally detectable in upper respiratory specimens during the acute phase of infection. The lowest concentration of SARS-CoV-2 viral copies this assay can detect is 138 copies/mL. A negative result does not preclude SARS-Cov-2 infection and should not be used as the sole basis for treatment or other patient management  decisions. A negative result may occur with  improper specimen collection/handling, submission of specimen other than nasopharyngeal swab, presence of viral mutation(s) within the areas targeted by this assay, and inadequate number of viral copies(<138 copies/mL). A negative result must be combined with clinical observations, patient history, and epidemiological information. The expected result is Negative.  Fact Sheet for Patients:  EntrepreneurPulse.com.au  Fact Sheet for Healthcare Providers:  IncredibleEmployment.be  This test is no t yet approved or cleared by the [place] FDA and  has been authorized for detection and/or diagnosis of SARS-CoV-2 by FDA under an Emergency Use Authorization (EUA). This EUA will remain  in effect (meaning this test can be used) for the duration of the COVID-19 declaration under Section 564(b)(1) of the Act, 21 U.S.C.section 360bbb-3(b)(1), unless the authorization is terminated  or revoked sooner.       Influenza A by PCR NEGATIVE NEGATIVE Final   Influenza B by PCR NEGATIVE NEGATIVE Final    Comment: (NOTE) The Xpert Xpress SARS-CoV-2/FLU/RSV plus assay is intended as an aid in the diagnosis of influenza from Nasopharyngeal swab specimens and should not be used as a sole basis for treatment. Nasal washings and aspirates are unacceptable for Xpert Xpress SARS-CoV-2/FLU/RSV testing.  Fact Sheet for Patients: EntrepreneurPulse.com.au  Fact Sheet for Healthcare Providers: IncredibleEmployment.be  This test is not yet approved or cleared by the [place] FDA and has been authorized for detection and/or diagnosis of SARS-CoV-2 by FDA under an Emergency Use Authorization (EUA). This EUA will remain in effect (meaning this test can be used) for the duration of the COVID-19 declaration under Section 564(b)(1) of the Act, 21 U.S.C. section 360bbb-3(b)(1), unless the  authorization is terminated or revoked.  Performed at [facility], [address] [place],  [address]      Total time spend on discharging this patient, including the last patient exam, discussing the hospital stay, instructions for ongoing care as it relates to all pertinent caregivers, as well as preparing the medical discharge records, prescriptions, and/or referrals as applicable, is 35 minutes.    Enzo Bi, MD  [facility] 10/05/2021, 5:04 PM

## 2021-10-05 NOTE — Progress Notes (Signed)
*  PRELIMINARY RESULTS* Echocardiogram 2D Echocardiogram has been performed.  [name] [name] 10/05/2021, 11:06 AM

## 2021-10-05 NOTE — ED Notes (Signed)
Pt asleep in bed, connected to monitor

## 2021-10-05 NOTE — Evaluation (Signed)
Physical Therapy Evaluation Patient Details Name: ORISSA ARREAGA MRN: [id number] DOB: 04-07-1966 Today's Date: 10/05/2021  History of Present Illness  Patient is a 56 year old female who reported to [facility] ED on 10/04/21 with c/c of diffiuclty speaking, blurry vision, and poor balance. PMH (+) for hypertension, hyperlipidemia, diabetes mellitus, COPD on 2 L oxygen at night, asthma, GERD, hypothyroidism, depression with anxiety, bipolar disorder, OSA not on CPAP, RLS, PVD, obesity with BMI 39.79, CAD, dCHF, migraine headache, CKD-3A   Clinical Impression  Patient tolerated session well and was agreeable to treatment. Upon arrival patient was supine in bed resting. Family entered shortly after start of session. Patient reported a headache but no pain. Prior to acute hospitalization patient reports being Mod I/Independent with all ADLs and mobility. She states she has a 2 wheeled walker and a cane at home prior when she had PT in the past. Patient lives in a mobile home with 5STE and bilateral handrails. Husband able to give 24/7 assistance if needed, and daughter lives right next door and able to give PRN assistance if needed.   Patient demonstrated Mod I with all bed mobility, sit to stand transfers, and ambulation throughout session with no unsteadiness or LOB noted. Patient demonstrates 3+/5 strength in bilateral lower extremities, however states that was her baseline strength. Overall, patient demonstrates at/near baseline function and does not require additional skilled physical therapy. Signing off.      Recommendations for follow up therapy are one component of a multi-disciplinary discharge planning process, led by the attending physician.  Recommendations may be updated based on patient status, additional functional criteria and insurance authorization.  Follow Up Recommendations No PT follow up    Assistance Recommended at Discharge None  Patient can return home with the following        Equipment Recommendations None recommended by PT  Recommendations for Other Services       Functional Status Assessment Patient has had a recent decline in their functional status and demonstrates the ability to make significant improvements in function in a reasonable and predictable amount of time.     Precautions / Restrictions Precautions Precautions: Fall Restrictions Weight Bearing Restrictions: No      Mobility  Bed Mobility Overal bed mobility: Modified Independent                  Transfers Overall transfer level: Modified independent Equipment used: None                    Ambulation/Gait Ambulation/Gait assistance: Modified independent (Device/Increase time) Gait Distance (Feet): 56 Feet Assistive device: None Gait Pattern/deviations: WFL(Within Functional Limits), Step-through pattern Gait velocity: Normal     General Gait Details: no LOB or unsteadiness noted; patient holds RUE against stomach during ambulation- when asked patient stated she has always done this  Stairs            Wheelchair Mobility    Modified Rankin (Stroke Patients Only)       Balance Overall balance assessment: Modified Independent                                           Pertinent Vitals/Pain Pain Assessment Pain Assessment: 0-10 Pain Location: headache Pain Intervention(s): Limited activity within patient's tolerance, Monitored during session, Repositioned    Home Living Family/patient expects to be discharged to:: Private residence Living Arrangements:  Spouse/significant other (husband, daughter lives next door) Available Help at Discharge: Family;Available 24 hours/day;Available PRN/intermittently Type of Home: Mobile home Home Access: Stairs to enter Entrance Stairs-Rails: Right;Left;Can reach both Entrance Stairs-Number of Steps: 5   Home Layout: One level Home Equipment: Conservation officer, nature (2 wheels);Cane - quad;Wheelchair -  manual      Prior Function Prior Level of Function : Independent/Modified Independent                     Hand Dominance   Dominant Hand: Right    Extremity/Trunk Assessment   Upper Extremity Assessment Upper Extremity Assessment: Overall WFL for tasks assessed    Lower Extremity Assessment Lower Extremity Assessment: Generalized weakness (3+/5 strength bilaterally, sensation and coordination intact)       Communication   Communication: No difficulties  Cognition Arousal/Alertness: Awake/alert Behavior During Therapy: WFL for tasks assessed/performed Overall Cognitive Status: Within Functional Limits for tasks assessed                                 General Comments: A&Ox3 location, situation, year        General Comments General comments (skin integrity, edema, etc.): O2 ranged from 93-95% on room air throughout session, HR ranged from 104-124bpm throughout session and patient was able to self recover with standing rest breaks during ambulation, RR 15    Exercises Other Exercises Other Exercises: patient educated on role of PT and fall risk Other Exercises: x2 S2S completed Mod I without any AD   Assessment/Plan    PT Assessment Patient does not need any further PT services  PT Problem List Decreased strength;Decreased activity tolerance       PT Treatment Interventions      PT Goals (Current goals can be found in the Care Plan section)  Acute Rehab PT Goals Patient Stated Goal: to go home PT Goal Formulation: With patient Time For Goal Achievement: 10/19/21 Potential to Achieve Goals: Good    Frequency       Co-evaluation               AM-PAC PT "6 Clicks" Mobility  Outcome Measure Help needed turning from your back to your side while in a flat bed without using bedrails?: None Help needed moving from lying on your back to sitting on the side of a flat bed without using bedrails?: None Help needed moving to and from a  bed to a chair (including a wheelchair)?: None Help needed standing up from a chair using your arms (e.g., wheelchair or bedside chair)?: None Help needed to walk in hospital room?: None Help needed climbing 3-5 steps with a railing? : None 6 Click Score: 24    End of Session Equipment Utilized During Treatment: Gait belt Activity Tolerance: Patient tolerated treatment well;No increased pain Patient left: in bed;with call bell/phone within reach;with family/visitor present Nurse Communication: Mobility status PT Visit Diagnosis: Muscle weakness (generalized) (M62.81)    Time: 9373-4287 PT Time Calculation (min) (ACUTE ONLY): 21 min   Charges:   PT Evaluation $PT Eval Low Complexity: 1 Low          Iva Boop, PT  10/05/21. 9:00 AM

## 2021-10-06 ENCOUNTER — Telehealth: Payer: Self-pay

## 2021-10-06 LAB — HEMOGLOBIN A1C
Hgb A1c MFr Bld: 8.3 % — ABNORMAL HIGH (ref 4.8–5.6)
Mean Plasma Glucose: 192 mg/dL

## 2021-10-06 NOTE — Telephone Encounter (Signed)
Transition Care Management Follow-up Telephone Call Date of discharge and from where: 10/05/21 [facility] How have you been since you were released from the hospital? Pt states she is doing okay, still feeling tired Any questions or concerns? No  Items Reviewed: Did the pt receive and understand the discharge instructions provided? Yes  Medications obtained and verified? Yes  Other? No  Any new allergies since your discharge? No Dietary orders reviewed? Yes Do you have support at home? Yes   Home Care and Equipment/Supplies: Were home health services ordered? no  Were any new equipment or medical supplies ordered?  No   Functional Questionnaire: (I = Independent and D = Dependent) ADLs: I  Bathing/Dressing- I  Meal Prep- i  Eating- I  Maintaining continence- I  Transferring/Ambulation- I  Managing Meds- I  Follow up appointments reviewed:  PCP Hospital f/u appt confirmed? No  PCP schedule full; please advise for scheduling Are transportation arrangements needed? No  If their condition worsens, is the pt aware to call PCP or go to the Emergency Dept.? Yes Was the patient provided with contact information for the PCP's office or ED? Yes Was to pt encouraged to call back with questions or concerns? Yes

## 2021-10-07 NOTE — Telephone Encounter (Signed)
Pt is scheduled °

## 2021-10-17 ENCOUNTER — Inpatient Hospital Stay: Payer: Medicare Other | Admitting: Physician Assistant

## 2021-10-18 ENCOUNTER — Other Ambulatory Visit: Payer: Self-pay | Admitting: Cardiovascular Disease

## 2021-10-18 NOTE — Telephone Encounter (Signed)
Please schedule overdue F/U appointment for refills. Thank you! 

## 2021-10-19 NOTE — Progress Notes (Addendum)
Cardiology Office Note:    Date:  10/21/2021   ID:  [name], DOB 17-Oct-1965, MRN [id number]  PCP:  Stacey Sizer, MD  [facility] Cardiologist:  Stacey Rogue, MD  [facility] Electrophysiologist:  None   Referring MD: Stacey Sizer, MD   Chief Complaint: overdue refills  History of Present Illness:    [name] is a 56 y.o. female with a hx of h/o uncontrolled DM 2, hypothyroidism, NASH, restless leg syndrome, chronic back pain, sinusitis, depression who presents for overdue refills.   She presented to the emergency department 06/26/2017 with chest pain, described as burning central chest pain that was nonradiating and going on since earlier that morning.  Chest pain was mild to moderate in intensity, constant for the last 8 to 9 hours, and somewhat pleuritic.  It was also worse when laying flat and better sitting upright.    2018 CT chest scan was reviewed with patient at her last visit and noted to be unable to exclude mild coronary artery calcification with mid LAD otherwise no aortic atherosclerosis.   She was last seen 11/12/2019 by her primary cardiologist, Dr. Rockey Yang.  At that time, she reported she was tired and having some chest pain at rest and occasionally with exertion. Echo was ordered with recommendation to consider future stress. Walker was recommended, as well as glycemic control.  Echo showed EF 55 to 60%, NRWMA.   In 04/2020 she reports testing positive for COVID-19.Seen 06/07/2020 with r/o ongoing/chronic dizziness and 13 falls in the last year.  He notes unchanged SOB, DOE, atypical CP, and 3 pillow orthopnea.  SBP 110-130s.  She has needed PM O2 since her COVID-19 infection.  In addition to restarting her beta-blocker, echo was recommended. She requested referral to nephrology, which was also provided given her kidney function.   Subsequent echo showed EF 55 to 60%, NRWMA, moderate asymmetric left ventricular hypertrophy of the septal segment (?HOCM),  indeterminate diastolic parameters.   She was seen 06/25/2020 and reported feeling about the same with atypical chest pain reported.  Carotids were ordered without significant disease.  Seen 10/13/2020 by her primary cardiologist and reported chest pain with coronary artery disease noted by CTA.  MPI was ordered by her primary cardiologist and ruled a low risk scan.  CT attenuation correction images showed mild aortic atherosclerosis with moderate proximal to mid LAD disease.  Last seen 01/06/21 and reported ongoing symptoms, atypical chest pain. Metoprolol was increased.   Today, she explains that she has been seen in the emergency department 3 times in the last 2 months, most recently yesterday.  She was feeling weak and sleepy all the time.  She had some numbness on her right side as well as a right-sided headache.  She also had some slurred speech.  She felt unsteady on her feet.  On exam in the ED she was having right-sided weakness.  They did a CT scan which was nonconclusive.  She could not have an MRI done due to her "bladder box".  She was not started on any new medications and was sent home with a referral to outpatient neurology.  Echocardiogram in the hospital was negative for thrombus, normal systolic function, grade 1 DD.  EKG today was normal sinus rhythm.  She does endorse occasional palpitations and racing heartbeat.  She states for the most part her metoprolol controls this.  She is requesting a refill today.  She does have occasional exertional shortness of breath.  Reports no chest  pain, pressure, or tightness. No edema, orthopnea, PND.   Past Medical History:  Diagnosis Date   Anxiety    Arthritis    Asthma    Bipolar depression ([place])    CHF (congestive heart failure) (HCC)    Chronic headaches    migraines have lessened   Chronic kidney disease    ddstage 111   COPD (chronic obstructive pulmonary disease) (HCC)    Coronary artery disease    Depression    Diabetic  neuropathy (HCC)    Dyspnea    uses inhalers for this   Dysrhythmia    rapid   Fatty liver    GERD (gastroesophageal reflux disease)    Headache    Heart attack ([place]) 2009   Hyperlipidemia    Hypertension    Hypomagnesemia    Hypothyroid    Irregular heart rhythm    Liver lesion    Favored to be benign on CT; MRI of abd w/contract in Aug 2016. Liver Biopsy-Negative, March 2016   Morbid obesity ([place])    OSA (obstructive sleep apnea)    Peripheral vascular disease (HCC)    Pinched nerve    Back   Pneumonia    RLS (restless legs syndrome)    Seasonal allergies    Sleep apnea    uses oxygen at night 2L d/t cpap did not fit   Type 2 diabetes, uncontrolled, with neuropathy 10/15/2015   Overview:  Microalbumin 7.7 12/2010: Hgb A1c 10.2% 12/2010 and 9.7% 03/2011, eye exam 10/2009 [facility]    Urinary incontinence    Vitamin D deficiency     Past Surgical History:  Procedure Laterality Date   ABDOMINAL HYSTERECTOMY     Partial   bladder stim  2007   BLADDER SURGERY     x 2. tacking of bladder and the other was the stimulator placement   CHOLECYSTECTOMY     COLONOSCOPY WITH PROPOFOL N/A 03/04/2019   Procedure: COLONOSCOPY WITH PROPOFOL;  Surgeon: [name], MD;  Location: [facility] ENDOSCOPY;  Service: Endoscopy;  Laterality: N/A;   ESOPHAGOGASTRODUODENOSCOPY (EGD) WITH PROPOFOL N/A 03/04/2019   Procedure: ESOPHAGOGASTRODUODENOSCOPY (EGD) WITH PROPOFOL;  Surgeon: [name], MD;  Location: [facility] ENDOSCOPY;  Service: Endoscopy;  Laterality: N/A;   INCISION AND DRAINAGE ABSCESS Right 04/16/2019   Procedure: INCISION AND DRAINAGE ABSCESS;  Surgeon: Benjamine Sprague, DO;  Location: [facility] ORS;  Service: General;  Laterality: Right;   INTERSTIM-GENERATOR CHANGE N/A 12/31/2017   Procedure: INTERSTIM-GENERATOR CHANGE;  Surgeon: Bjorn Loser, MD;  Location: [facility] ORS;  Service: Urology;  Laterality: N/A;   LIVER BIOPSY  March 2016   Negative   LUNG BIOPSY  2015   small spot on lung   TUBAL  LIGATION     UPPER GI ENDOSCOPY     WRIST SURGERY Right 11/02/2019    Current Medications: Current Meds  Medication Sig   aspirin EC 81 MG EC tablet Take 1 tablet (81 mg total) by mouth daily. Swallow whole.   atorvastatin (LIPITOR) 80 MG tablet Take 80 mg by mouth at bedtime.    busPIRone (BUSPAR) 30 MG tablet Take 30 mg by mouth 2 (two) times daily.   citalopram (CELEXA) 20 MG tablet Take 20 mg by mouth daily.   esomeprazole (NEXIUM) 40 MG capsule Take 1 capsule (40 mg total) by mouth in the morning. MUST SCHEDULE OFFICE VISIT   ezetimibe (ZETIA) 10 MG tablet Take 10 mg by mouth at bedtime.   fenofibrate micronized (LOFIBRA) 134 MG capsule Take 134  mg by mouth at bedtime.   fluticasone (FLONASE) 50 MCG/ACT nasal spray Place 2 sprays into both nostrils daily.   gabapentin (NEURONTIN) 300 MG capsule Take 300 mg by mouth 2 (two) times daily.   gabapentin (NEURONTIN) 600 MG tablet Take 600 mg by mouth at bedtime.   GLYXAMBI 25-5 MG TABS Take 1 tablet by mouth daily.   HUMULIN R U-500 KWIKPEN 500 UNIT/ML kwikpen Inject 70 Units into the skin 3 (three) times daily with meals.   Insulin Pen Needle (PEN NEEDLES) 31G X 8 MM MISC    lamoTRIgine (LAMICTAL) 200 MG tablet Take 200 mg by mouth 2 (two) times daily.    lisinopril (ZESTRIL) 2.5 MG tablet Take 1 tablet (2.5 mg total) by mouth daily.   nitroGLYCERIN (NITROSTAT) 0.4 MG SL tablet Place 1 tablet (0.4 mg total) under the tongue every 5 (five) minutes as needed for chest pain.   OneTouch Delica Lancets 10G MISC by Does not apply route.   promethazine (PHENERGAN) 12.5 MG tablet TAKE 1 TABLET BY MOUTH EVERY 6 HOURS AS NEEDED FOR NAUSEA OR VOMITING   QUEtiapine (SEROQUEL) 200 MG tablet Take 200 mg by mouth at bedtime. Along with 369m dose for a total of 5039m  QUEtiapine (SEROQUEL) 300 MG tablet Take 300 mg by mouth at bedtime. Along with 20081mose for a total of 500m5mSUMAtriptan (IMITREX) 100 MG tablet TAKE 1 TABLET BY MOUTH ONCE FOR 1  DOSE. MAY REPEAT IN 2 HOURS IF HEADACHE PERSISTS OR RECURS.   SYNTHROID 100 MCG tablet Take 100 mcg by mouth daily.   TRULICITY 1.5 MG/0YI/9.4WNN Inject 1.5 mg into the skin once a week. (Wednesdays)   [DISCONTINUED] metoprolol tartrate (LOPRESSOR) 25 MG tablet TAKE 1 TABLET BY MOUTH TWICE DAILY     Allergies:   Mirtazapine, Morphine and related, Sulfamethoxazole-trimethoprim, Vicodin hp [hydrocodone-acetaminophen], Amoxicillin er, Codeine, Hydrocodone-acetaminophen, Keflex [cephalexin], and Prasterone   Social History   Socioeconomic History   Marital status: Married    Spouse name: Not on file   Number of children: 2   Years of education: 12   Highest education level: Not on file  Occupational History   Occupation: disabled  Tobacco Use   Smoking status: Never   Smokeless tobacco: Never  Vaping Use   Vaping Use: Never used  Substance and Sexual Activity   Alcohol use: No   Drug use: No   Sexual activity: Yes    Partners: Male    Birth control/protection: Surgical  Other Topics Concern   Not on file  Social History Narrative   Pt lives with husband and grandchildren   Social Determinants of Health   Financial Resource Strain: Low Risk    Difficulty of Paying Living Expenses: Not hard at all  Food Insecurity: No Food Insecurity   Worried About RunnCharity fundraiserthe Last Year: Never true   Ran Arboriculturistthe Last Year: Never true  Transportation Needs: No Transportation Needs   Lack of Transportation (Medical): No   Lack of Transportation (Non-Medical): No  Physical Activity: Inactive   Days of Exercise per Week: 0 days   Minutes of Exercise per Session: 0 min  Stress: Stress Concern Present   Feeling of Stress : Rather much  Social Connections: Moderately Isolated   Frequency of Communication with Friends and Family: More than three times a week   Frequency of Social Gatherings with Friends and Family: More than three times a week  Attends Religious  Services: Never   Active Member of Clubs or Organizations: No   Attends Music therapist: Never   Marital Status: Married     Family History: [name] family history includes Asthma in her daughter; Bipolar disorder in her daughter and daughter; Breast cancer (age of onset: 109) in her maternal grandmother; Cancer in her daughter, maternal grandfather, and paternal grandmother; Congestive Heart Failure in her mother; Diabetes in her mother; Glaucoma in her mother; Heart disease in her maternal grandfather and mother; Hyperlipidemia in her mother; Hypertension in her daughter, father, and mother; Hypothyroidism in her mother; Kidney disease in her brother, maternal grandfather, and mother; Mental illness in her mother; Osteoporosis in her mother; Restless legs syndrome in her daughter; Rheum arthritis in her maternal grandfather; Stroke in her mother.  ROS:   Please see the history of present illness.     All other systems reviewed and are negative.  EKGs/Labs/Other Studies Reviewed:    The following studies were reviewed today:  MPI 10/2020 Pharmacological myocardial perfusion imaging study with no significant  ischemia Normal wall motion, EF estimated at 65% No EKG changes concerning for ischemia at peak stress or in recovery. CT attenuation correction images with mild aortic atherosclerosis, moderate proximal to mid LAD disease. Low risk scan   Echo 12/01/20  1. Left ventricular ejection fraction, by estimation, is 60 to 65%. The  left ventricle has normal function. The left ventricle has no regional  wall motion abnormalities. There is mild left ventricular hypertrophy.  Left ventricular diastolic parameters  were normal. The global longitudinal strain is normal.   2. Right ventricular systolic function is normal. The right ventricular  size is normal. Tricuspid regurgitation signal is inadequate for assessing  PA pressure.   3. The mitral valve is normal in  structure. No evidence of mitral valve  regurgitation. No evidence of mitral stenosis.   4. The aortic valve is normal in structure. Aortic valve regurgitation is  not visualized. No aortic stenosis is present.    Carotid study 07/12/20 Summary:  Right Carotid: There is no evidence of stenosis in the right ICA.  Left Carotid: There is no evidence of stenosis in the left ICA.  Vertebrals:  Bilateral vertebral arteries demonstrate antegrade flow.  Subclavians: Normal flow hemodynamics were seen in bilateral subclavian               arteries.     Echo 06/08/20  1. Left ventricular ejection fraction, by estimation, is 55 to 60%. The  left ventricle has normal function. The left ventricle has no regional  wall motion abnormalities. There is moderate asymmetric left ventricular  hypertrophy of the septal segment.  Left ventricular diastolic parameters are indeterminate.   2. Right ventricular systolic function is low normal. The right  ventricular size is normal. There is normal pulmonary artery systolic  pressure.   3. The mitral valve is normal in structure. Trivial mitral valve  regurgitation.   4. The aortic valve was not well visualized. Aortic valve regurgitation  is not visualized. No aortic stenosis is present.   5. The inferior vena cava is normal in size with greater than 50%  respiratory variability, suggesting right atrial pressure of 3 mmHg.    Echo 12/04/19  1. Left ventricular ejection fraction, by estimation, is 55 to 60%. The  left ventricle has normal function. The left ventricle has no regional  wall motion abnormalities. Left ventricular diastolic parameters were  normal.   2. Right  ventricular systolic function is normal. The right ventricular  size is normal. There is normal pulmonary artery systolic pressure.   3. The mitral valve is normal in structure. No evidence of mitral valve  regurgitation. No evidence of mitral stenosis.   4. The aortic valve is normal in  structure. Aortic valve regurgitation is  not visualized. No aortic stenosis is present.   5. The inferior vena cava is normal in size with greater than 50%  respiratory variability, suggesting right atrial pressure of 3 mmHg.    CTA 04/28/2020 IMPRESSION: 1. No pulmonary embolism.  Study is mildly limited by bolus timing. 2. Bilateral ground-glass opacities throughout the chest with peripheral predominance. Findings are consistent with the provided history of COVID-19 infection. 3. Mildly enlarged mediastinal and hilar lymph nodes, likely reactive. 4. Hepatic steatosis. 5. Aortic atherosclerosis.   Aortic Atherosclerosis (ICD10-I70.0).  EKG:  EKG is  ordered today.  The ekg ordered today demonstrates NSR, rate 79 bpm  Recent Labs: 12/02/2020: Magnesium 1.8 09/17/2021: TSH 0.04 10/04/2021: ALT 40; B Natriuretic Peptide 8.7; BUN 25; Creatinine, Ser 1.34; Hemoglobin 11.7; Platelets 213; Potassium 4.7; Sodium 139  Recent Lipid Panel    Component Value Date/Time   CHOL 80 10/05/2021 0641   CHOL 196 09/22/2015 1339   CHOL 141 10/25/2013 0314   TRIG 228 (H) 10/05/2021 0641   TRIG 830 (H) 10/25/2013 0314   HDL 23 (L) 10/05/2021 0641   HDL 35 (L) 09/22/2015 1339   HDL 9 (L) 10/25/2013 0314   CHOLHDL 3.5 10/05/2021 0641   VLDL 46 (H) 10/05/2021 0641   VLDL SEE COMMENT 10/25/2013 0314   LDLCALC 11 10/05/2021 0641   LDLCALC 99 09/22/2015 1339   LDLCALC SEE COMMENT 10/25/2013 0314   LDLDIRECT 65.4 06/08/2008 1456    Physical Exam:    VS:  BP 140/88 (BP Location: Left Arm, Patient Position: Sitting, Cuff Size: Large)    Pulse 79    Ht 4' 11"  (1.499 m)    Wt 206 lb (93.4 kg)    SpO2 92%    BMI 41.61 kg/m     Wt Readings from Last 3 Encounters:  10/21/21 206 lb (93.4 kg)  10/04/21 197 lb (89.4 kg)  09/19/21 195 lb (88.5 kg)     GEN:  Well nourished, well developed in no acute distress HEENT: Normal NECK: No JVD; No carotid bruits LYMPHATICS: No lymphadenopathy CARDIAC: RRR,  no murmurs, rubs, gallops RESPIRATORY:  Clear to auscultation without rales, wheezing or rhonchi  ABDOMEN: Soft, non-tender, non-distended MUSCULOSKELETAL:  No edema; No deformity  SKIN: Warm and dry NEUROLOGIC:  Alert and oriented x 3 PSYCHIATRIC:  Normal affect   ASSESSMENT:    1. Palpitations   2. Chest pain of uncertain etiology   3. Chronic chest pain   4. Essential hypertension   5. Mixed hyperlipidemia   6. Chronic heart failure with preserved ejection fraction (HCC)   7. Chronic bronchitis, unspecified chronic bronchitis type ([place])   8. TIA (transient ischemic attack)    PLAN:    In order of problems listed above:  Chronic atypical chest pain with mild coronary calcification -No recent chest pain -Continue ASA, Lipitor, Zetia, Lisinopril, lopressor, and PRN nitro   2. HR palpitations -Still having occasional palpitations even when on metoprolol -We will plan to order a ZIO monitor for 14 days to ensure she is not having any episodes of atrial fibrillation in the setting of multiple TIAs  3. HTN -Well-controlled today in the clinic -She is  encouraged to take her blood pressure at home daily for the next 2 weeks and send me the readings -MyChart instructions provided to the patient today -Blood pressure cuff provided  4. HFpEF -echocardiogram in the hospital showed no thrombus, normal EF, Grade 1 DD.  -She is having some continued DOE but does not appear to be heart related at this time.   5. HLD/Hypertriglyceridemia -We will need to consider fish oil or Vascepa at her next visit for uncontrolled triglycerides  6. COPD -likely contributing to her DOE -Defer to PCP to titrate appropriate inhaled medications   Disposition: Follow up 2 months with Dr. Rockey Yang or APP    Signed, Elgie Collard, PA-C  10/21/2021 10:24 AM    [place]

## 2021-10-19 NOTE — Telephone Encounter (Signed)
Scheduled

## 2021-10-20 DIAGNOSIS — R531 Weakness: Secondary | ICD-10-CM | POA: Diagnosis not present

## 2021-10-20 DIAGNOSIS — E785 Hyperlipidemia, unspecified: Secondary | ICD-10-CM | POA: Diagnosis not present

## 2021-10-20 DIAGNOSIS — E039 Hypothyroidism, unspecified: Secondary | ICD-10-CM | POA: Diagnosis not present

## 2021-10-20 DIAGNOSIS — F319 Bipolar disorder, unspecified: Secondary | ICD-10-CM | POA: Diagnosis not present

## 2021-10-20 DIAGNOSIS — N1831 Chronic kidney disease, stage 3a: Secondary | ICD-10-CM | POA: Diagnosis not present

## 2021-10-20 DIAGNOSIS — E1159 Type 2 diabetes mellitus with other circulatory complications: Secondary | ICD-10-CM | POA: Diagnosis not present

## 2021-10-20 DIAGNOSIS — E1122 Type 2 diabetes mellitus with diabetic chronic kidney disease: Secondary | ICD-10-CM | POA: Diagnosis not present

## 2021-10-20 DIAGNOSIS — G4733 Obstructive sleep apnea (adult) (pediatric): Secondary | ICD-10-CM | POA: Diagnosis not present

## 2021-10-20 DIAGNOSIS — R200 Anesthesia of skin: Secondary | ICD-10-CM | POA: Diagnosis not present

## 2021-10-20 DIAGNOSIS — R519 Headache, unspecified: Secondary | ICD-10-CM | POA: Diagnosis not present

## 2021-10-20 DIAGNOSIS — I2510 Atherosclerotic heart disease of native coronary artery without angina pectoris: Secondary | ICD-10-CM | POA: Diagnosis not present

## 2021-10-20 DIAGNOSIS — M6281 Muscle weakness (generalized): Secondary | ICD-10-CM | POA: Diagnosis not present

## 2021-10-20 DIAGNOSIS — R5383 Other fatigue: Secondary | ICD-10-CM | POA: Diagnosis not present

## 2021-10-20 DIAGNOSIS — Z9981 Dependence on supplemental oxygen: Secondary | ICD-10-CM | POA: Diagnosis not present

## 2021-10-20 DIAGNOSIS — E1151 Type 2 diabetes mellitus with diabetic peripheral angiopathy without gangrene: Secondary | ICD-10-CM | POA: Diagnosis not present

## 2021-10-20 DIAGNOSIS — J449 Chronic obstructive pulmonary disease, unspecified: Secondary | ICD-10-CM | POA: Diagnosis not present

## 2021-10-20 DIAGNOSIS — I130 Hypertensive heart and chronic kidney disease with heart failure and stage 1 through stage 4 chronic kidney disease, or unspecified chronic kidney disease: Secondary | ICD-10-CM | POA: Diagnosis not present

## 2021-10-20 DIAGNOSIS — I5032 Chronic diastolic (congestive) heart failure: Secondary | ICD-10-CM | POA: Diagnosis not present

## 2021-10-21 ENCOUNTER — Ambulatory Visit (INDEPENDENT_AMBULATORY_CARE_PROVIDER_SITE_OTHER): Payer: Medicare Other | Admitting: Physician Assistant

## 2021-10-21 ENCOUNTER — Other Ambulatory Visit: Payer: Self-pay

## 2021-10-21 ENCOUNTER — Encounter: Payer: Self-pay | Admitting: Medical

## 2021-10-21 ENCOUNTER — Ambulatory Visit (INDEPENDENT_AMBULATORY_CARE_PROVIDER_SITE_OTHER): Payer: Medicare Other

## 2021-10-21 VITALS — BP 140/88 | HR 79 | Ht 59.0 in | Wt 206.0 lb

## 2021-10-21 DIAGNOSIS — E782 Mixed hyperlipidemia: Secondary | ICD-10-CM

## 2021-10-21 DIAGNOSIS — R079 Chest pain, unspecified: Secondary | ICD-10-CM

## 2021-10-21 DIAGNOSIS — R002 Palpitations: Secondary | ICD-10-CM

## 2021-10-21 DIAGNOSIS — J42 Unspecified chronic bronchitis: Secondary | ICD-10-CM

## 2021-10-21 DIAGNOSIS — G459 Transient cerebral ischemic attack, unspecified: Secondary | ICD-10-CM

## 2021-10-21 DIAGNOSIS — I10 Essential (primary) hypertension: Secondary | ICD-10-CM | POA: Diagnosis not present

## 2021-10-21 DIAGNOSIS — G8929 Other chronic pain: Secondary | ICD-10-CM

## 2021-10-21 DIAGNOSIS — I5032 Chronic diastolic (congestive) heart failure: Secondary | ICD-10-CM

## 2021-10-21 MED ORDER — METOPROLOL TARTRATE 25 MG PO TABS: 25.0000 mg | ORAL_TABLET | Freq: Two times a day (BID) | ORAL | 3 refills | Status: DC

## 2021-10-21 NOTE — Patient Instructions (Signed)
Medication Instructions:   Your physician recommends that you continue on your current medications as directed. Please refer to the Current Medication list given to you today.   *If you need a refill on your cardiac medications before your next appointment, please call your pharmacy*   Lab Work: None ordered  If you have labs (blood work) drawn today and your tests are completely normal, you will receive your results only by: [place] (if you have MyChart) OR A paper copy in the mail If you have any lab test that is abnormal or we need to change your treatment, we will call you to review the results.   Testing/Procedures: Your provider has ordered a heart monitor to wear for 14 days. This will be mailed to your home with instructions on placement. Once you have finished the time frame requested, you will return monitor in box provided.      Follow-Up: At [facility], you and your health needs are our priority.  As part of our continuing mission to provide you with exceptional heart care, we have created designated Provider Care Teams.  These Care Teams include your primary Cardiologist (physician) and Advanced Practice Providers (APPs -  Physician Assistants and Nurse Practitioners) who all work together to provide you with the care you need, when you need it.  We recommend signing up for the patient portal called "MyChart".  Sign up information is provided on this After Visit Summary.  MyChart is used to connect with patients for Virtual Visits (Telemedicine).  Patients are able to view lab/test results, encounter notes, upcoming appointments, etc.  Non-urgent messages can be sent to your provider as well.   To learn more about what you can do with MyChart, go to [email or web address].    Your next appointment:   2 month(s)  The format for your next appointment:   In Person  Provider:   You may see Ida Rogue, MD or one of the following Advanced Practice Providers  on your designated Care Team:   Murray Hodgkins, NP Christell Faith, PA-C Cadence Kathlen Mody, PA-C :1}    Other Instructions  Make sure to check Blood Pressures 2 hours after your medications.   Please keep a log of your readings so we can see how they trend.  Avoid these things for 30 minutes before checking your blood pressure:  NO Drinking caffeine.  NO Drinking alcohol.  NO Eating.  NO Smoking.  NO Exercising.  Five minutes before checking your blood pressure:  Pee.  Sit in a dining chair. Avoid sitting in a soft couch or armchair.  Be quiet. Do not talk.

## 2021-10-25 DIAGNOSIS — R002 Palpitations: Secondary | ICD-10-CM | POA: Diagnosis not present

## 2021-10-28 ENCOUNTER — Telehealth: Payer: Self-pay | Admitting: Cardiovascular Disease

## 2021-10-28 NOTE — Telephone Encounter (Signed)
Patient states her monitor is blinking red and needs someone to call her. She states she called the company last night and they told her it wasn't in the right place. Please call to discuss.

## 2021-10-28 NOTE — Telephone Encounter (Signed)
Spoke to [place] with [employer].  Pt to remove current zio and mail back.  Ailene Ravel will have a second zio sent to pt with expedited shipping.   Advised pt to call our office once she receives second zio and we will put her on schedule to have placed in the office.   Pt voiced appreciation and has no further questions at this time.

## 2021-10-28 NOTE — Telephone Encounter (Signed)
Called and spoke to pt.  She initially placed zio monitor 2 days ago on 10/26/20.  Called customer service number yesterday d/t red light on monitor.  Was instructed monitor in wrong place. States she got it working while on phone with customer service.  Now states monitor is peeling off and red light has returned.   Notified pt that we can send a replacement.  Advised pt I will call her back after I speak with our rep to confirm.  Pt would like to have second monitor placed in the office to ensure proper placement.  I notified pt that when she receives second monitor to call our office and we will place on our (lab) schedule to place monitor.   Called and left vm with Almyra Free zio rep to call back to discuss.

## 2021-10-31 ENCOUNTER — Emergency Department: Payer: Medicare Other

## 2021-10-31 ENCOUNTER — Other Ambulatory Visit: Payer: Self-pay

## 2021-10-31 ENCOUNTER — Emergency Department
Admission: EM | Admit: 2021-10-31 | Discharge: 2021-10-31 | Disposition: A | Discharge status: Home / Self Care | Payer: Medicare Other | Attending: Student in an Organized Health Care Education/Training Program | Admitting: Student in an Organized Health Care Education/Training Program

## 2021-10-31 DIAGNOSIS — J449 Chronic obstructive pulmonary disease, unspecified: Secondary | ICD-10-CM | POA: Diagnosis not present

## 2021-10-31 DIAGNOSIS — R200 Anesthesia of skin: Secondary | ICD-10-CM | POA: Diagnosis not present

## 2021-10-31 DIAGNOSIS — E119 Type 2 diabetes mellitus without complications: Secondary | ICD-10-CM | POA: Insufficient documentation

## 2021-10-31 DIAGNOSIS — I639 Cerebral infarction, unspecified: Secondary | ICD-10-CM | POA: Diagnosis not present

## 2021-10-31 DIAGNOSIS — J45909 Unspecified asthma, uncomplicated: Secondary | ICD-10-CM | POA: Diagnosis not present

## 2021-10-31 DIAGNOSIS — R202 Paresthesia of skin: Secondary | ICD-10-CM | POA: Diagnosis not present

## 2021-10-31 DIAGNOSIS — M5412 Radiculopathy, cervical region: Secondary | ICD-10-CM | POA: Diagnosis not present

## 2021-10-31 LAB — DIFFERENTIAL
Abs Immature Granulocytes: 0.09 10*3/uL — ABNORMAL HIGH (ref 0.00–0.07)
Basophils Absolute: 0 10*3/uL (ref 0.0–0.1)
Basophils Relative: 0 %
Eosinophils Absolute: 0.2 10*3/uL (ref 0.0–0.5)
Eosinophils Relative: 3 %
Immature Granulocytes: 1 %
Lymphocytes Relative: 36 %
Lymphs Abs: 2.5 10*3/uL (ref 0.7–4.0)
Monocytes Absolute: 0.7 10*3/uL (ref 0.1–1.0)
Monocytes Relative: 10 %
Neutro Abs: 3.5 10*3/uL (ref 1.7–7.7)
Neutrophils Relative %: 50 %

## 2021-10-31 LAB — CBG MONITORING, ED: Glucose-Capillary: 126 mg/dL — ABNORMAL HIGH (ref 70–99)

## 2021-10-31 LAB — COMPREHENSIVE METABOLIC PANEL
ALT: 42 U/L (ref 0–44)
AST: 50 U/L — ABNORMAL HIGH (ref 15–41)
Albumin: 3.5 g/dL (ref 3.5–5.0)
Alkaline Phosphatase: 48 U/L (ref 38–126)
Anion gap: 12 (ref 5–15)
BUN: 35 mg/dL — ABNORMAL HIGH (ref 6–20)
CO2: 26 mmol/L (ref 22–32)
Calcium: 10 mg/dL (ref 8.9–10.3)
Chloride: 103 mmol/L (ref 98–111)
Creatinine, Ser: 1.37 mg/dL — ABNORMAL HIGH (ref 0.44–1.00)
GFR, Estimated: 45 mL/min — ABNORMAL LOW (ref >60)
Glucose, Bld: 145 mg/dL — ABNORMAL HIGH (ref 70–99)
Potassium: 4.2 mmol/L (ref 3.5–5.1)
Sodium: 141 mmol/L (ref 135–145)
Total Bilirubin: 0.4 mg/dL (ref 0.3–1.2)
Total Protein: 6 g/dL — ABNORMAL LOW (ref 6.5–8.1)

## 2021-10-31 LAB — PROTIME-INR
INR: 1 (ref 0.8–1.2)
Prothrombin Time: 12.6 seconds (ref 11.4–15.2)

## 2021-10-31 LAB — CBC
HCT: 38.3 % (ref 36.0–46.0)
Hemoglobin: 11.9 g/dL — ABNORMAL LOW (ref 12.0–15.0)
MCH: 29.2 pg (ref 26.0–34.0)
MCHC: 31.1 g/dL (ref 30.0–36.0)
MCV: 93.9 fL (ref 80.0–100.0)
Platelets: 244 10*3/uL (ref 150–400)
RBC: 4.08 MIL/uL (ref 3.87–5.11)
RDW: 14 % (ref 11.5–15.5)
WBC: 7 10*3/uL (ref 4.0–10.5)
nRBC: 0 % (ref 0.0–0.2)

## 2021-10-31 LAB — TROPONIN I (HIGH SENSITIVITY): Troponin I (High Sensitivity): 5 ng/L (ref <18)

## 2021-10-31 LAB — APTT: aPTT: 27 seconds (ref 24–36)

## 2021-10-31 MED ORDER — CLOPIDOGREL BISULFATE 75 MG PO TABS: 75.0000 mg | ORAL_TABLET | Freq: Every day | ORAL | 11 refills | Status: AC

## 2021-10-31 MED ORDER — SODIUM CHLORIDE 0.9% FLUSH
3.0000 mL | Freq: Once | INTRAVENOUS | Status: AC
  Administered 2021-10-31: 3 mL via INTRAVENOUS

## 2021-10-31 MED ORDER — ACETAMINOPHEN 325 MG PO TABS
650.0000 mg | ORAL_TABLET | Freq: Once | ORAL | Status: AC
Start: 2021-10-31 — End: 2021-10-31
  Administered 2021-10-31: 650 mg via ORAL
  Filled 2021-10-31: qty 2

## 2021-10-31 MED ORDER — LORAZEPAM 0.5 MG PO TABS
0.5000 mg | ORAL_TABLET | Freq: Once | ORAL | Status: AC
  Administered 2021-10-31: 0.5 mg via ORAL
  Filled 2021-10-31: qty 1

## 2021-10-31 NOTE — ED Notes (Signed)
Called code stroke to [place]  [phone]

## 2021-10-31 NOTE — ED Notes (Signed)
PA at bedside-[place]. Taken to CT by Nira Conn, RN

## 2021-10-31 NOTE — ED Provider Notes (Signed)
[facility] Provider Note    Event Date/Time   First MD Initiated Contact with Patient 10/31/21 1226     (approximate)   History   Extremity Weakness   HPI  [name] is a 56 y.o. female with a history of diabetes anxiety, asthma CHS COPD has been evaluated for TIA presents to the ER for evaluation of right-sided tingling sensation started around 10.  Code stroke called upon arrival she was within the window.  She denies any chest pain or pressure.  Does have mild headache.  Takes an aspirin.     Physical Exam   Triage Vital Signs: ED Triage Vitals [10/31/21 1212]  Enc Vitals Group     BP 95/64     Pulse Rate 80     Resp 16     Temp 98 F (36.7 C)     Temp Source Oral     SpO2 93 %     Weight 205 lb 0.4 oz (93 kg)     Height 4' 11"  (1.499 m)     Head Circumference      Peak Flow      Pain Score 4     Pain Loc      Pain Edu?      Excl. in [place]?     Most recent vital signs: Vitals:   10/31/21 1400 10/31/21 1534  BP: 116/69 107/71  Pulse: 85 87  Resp: (!) 21 17  Temp:    SpO2: 96% 93%     Constitutional: Alert  Eyes: Conjunctivae are normal.  Head: Atraumatic. Nose: No congestion/rhinnorhea. Mouth/Throat: Mucous membranes are moist.   Neck: Painless ROM.  Cardiovascular:   Good peripheral circulation. Respiratory: Normal respiratory effort.  No retractions.  Gastrointestinal: Soft and nontender.  Musculoskeletal:  no deformity Neurologic:  CN- intact.  No facial droop, Normal FNF.  Normal heel to shin.  Sensation intact bilaterally. Normal speech and language. No gross focal neurologic deficits are appreciated. No gait instability.  Skin:  Skin is warm, dry and intact. No rash noted. Psychiatric: Mood and affect are normal. Speech and behavior are normal.    ED Results / Procedures / Treatments   Labs (all labs ordered are listed, but only abnormal results are displayed) Labs Reviewed  CBC - Abnormal; Notable for the  following components:      Result Value   Hemoglobin 11.9 (*)    All other components within normal limits  DIFFERENTIAL - Abnormal; Notable for the following components:   Abs Immature Granulocytes 0.09 (*)    All other components within normal limits  COMPREHENSIVE METABOLIC PANEL - Abnormal; Notable for the following components:   Glucose, Bld 145 (*)    BUN 35 (*)    Creatinine, Ser 1.37 (*)    Total Protein 6.0 (*)    AST 50 (*)    GFR, Estimated 45 (*)    All other components within normal limits  CBG MONITORING, ED - Abnormal; Notable for the following components:   Glucose-Capillary 126 (*)    All other components within normal limits  PROTIME-INR  APTT  CBG MONITORING, ED  POC URINE PREG, ED  TROPONIN I (HIGH SENSITIVITY)  TROPONIN I (HIGH SENSITIVITY)     EKG  ED ECG REPORT I, Merlyn Lot, the attending physician, personally viewed and interpreted this ECG.   Date: 10/31/2021  EKG Time: 12:55  Rate: 80  Rhythm: sinus  Axis: normal  Intervals: normal  ST&T Change:  no stemi, poor r wave progression    RADIOLOGY Please see ED Course for my review and interpretation.  I personally reviewed all radiographic images ordered to evaluate for the above acute complaints and reviewed radiology reports and findings.  These findings were personally discussed with the patient.  Please see medical record for radiology report.    PROCEDURES:  Critical Care performed: No  Procedures   MEDICATIONS ORDERED IN ED: Medications  sodium chloride flush (NS) 0.9 % injection 3 mL (3 mLs Intravenous Given 10/31/21 1304)  LORazepam (ATIVAN) tablet 0.5 mg (0.5 mg Oral Given 10/31/21 1458)  acetaminophen (TYLENOL) tablet 650 mg (650 mg Oral Given 10/31/21 1458)     IMPRESSION / MDM / ASSESSMENT AND PLAN / ED COURSE  I reviewed the triage vital signs and the nursing notes.                              Differential diagnosis includes, but is not limited to, cva, tia,  hypoglycemia, dehydration, electrolyte abnormality, dissection, sepsis  Patient presents with symptoms as described above.  Protecting her airway.  Patient made code stroke in triage due to onset of symptoms within TNK window.  Patient evaluated immediately by Dr. Quinn Axe of neurology and given otherwise reassuring exam does not make criteria for TNK.  CT head without acute abnormality.  Does not seem consistent with cardiac etiology. Doubt dissection or PE.  Doubt sepsis.  Clinical Course as of 10/31/21 1600  Mon Oct 31, 2021  1229 CT head by my review does not show subdural hematoma.  Await formal radiology report. [PR]  50 Dr. Quinn Axe of neurology has evaluated patient.  Has had extensive TIA work-up as an outpatient recently.  CT head negative cannot MRI due to stimulator present.  NIH of only 1.  Has recommended CT cervical spine but if negative would be appropriate for continued outpatient follow-up. [PR]  5462 My review of CT cervical spine I do not appreciate evidence of fracture. [PR]  1424 Patient states that she does feel very anxious would like some Ativan and Tylenol. [PR]    Clinical Course User Index [PR] Merlyn Lot, MD   As well at this time.  There is been a delay in disposition and care due to chemistry machine being down and we are still waiting troponins.  I will much lower suspicion for ACS but given her age and risk factor want to make sure that her enzymes are negative.  CT cervical spine is negative per radiology for acute abnormality.  Troponins are negative patient will be appropriate for outpatient follow-up will order Plavix prescription per neurology recommendations.  Patient signed out to oncoming physician pending follow-up troponin.  If negative anticipate discharge home.   FINAL CLINICAL IMPRESSION(S) / ED DIAGNOSES   Final diagnoses:  Paresthesia of right upper extremity     Rx / DC Orders   ED Discharge Orders          Ordered    clopidogrel  (PLAVIX) 75 MG tablet  Daily        10/31/21 1558             Note:  This document was prepared using Dragon voice recognition software and may include unintentional dictation errors.    Merlyn Lot, MD 10/31/21 1600

## 2021-10-31 NOTE — ED Notes (Signed)
Per Dr. Ellender Hose, no need for 2nd trop, okay to dc.

## 2021-10-31 NOTE — Progress Notes (Signed)
°   10/31/21 1300  Clinical Encounter Type  Visited With Patient  Visit Type Initial;Code   Chaplain responded to Code Stroke and checked in on patient a couple of times. Patient was busy being attended to by nurses. Chaplain did get some time to provide services.

## 2021-10-31 NOTE — ED Notes (Signed)
Pt ambulated to toilet by self, steady gait.

## 2021-10-31 NOTE — ED Provider Triage Note (Signed)
Emergency Medicine Provider Triage Evaluation Note  [name] , a 56 y.o. female  was evaluated in triage.  Pt complains of right-sided weakness, slurred speech, forgetful, symptoms started 2 hours ago.  Patient states she arrived at the grocery store could not remember where she was at..  Review of Systems  Positive: Weakness, slurred speech, right-sided numbness Negative: Fever, chills  Physical Exam  BP 95/64 (BP Location: Left Arm)    Pulse 80    Temp 98 F (36.7 C) (Oral)    Resp 16    Ht 4' 11"  (1.499 m)    Wt 93 kg    SpO2 93%    BMI 41.41 kg/m  Gen:   Awake, no distress   Resp:  Normal effort  MSK:   Moves extremities without difficulty, and some weakness noted in the right hand when compared to the left, smile is equal, patient has difficulty following commands Other:  Tongue deviates  Medical Decision Making  Medically screening exam initiated at 12:19 PM.  Appropriate orders placed.  Zena Amos Knab was informed that the remainder of the evaluation will be completed by another provider, this initial triage assessment does not replace that evaluation, and the importance of remaining in the ED until their evaluation is complete.     Versie Starks, PA-C 10/31/21 1220

## 2021-10-31 NOTE — ED Triage Notes (Addendum)
Reports right sided decreased sensation, slurred speech and forgetfulness during conversations that started 2 hours ago. Pt with 2 recent TIA's. Also reporting headache. No weakness noted-only decreased sensation to right arm and leg.

## 2021-10-31 NOTE — ED Notes (Signed)
Informed primary nurse, Lynn Ito that CODE STROKE patient was coming to Room 17 and that she is in CT with Nira Conn, RN at this time.

## 2021-10-31 NOTE — Consult Note (Signed)
NEUROLOGY CONSULTATION NOTE   Date of service: October 31, 2021 Patient Name: [name] MRN:  [id number] DOB:  [birth year] Reason for consult: stroke code Requesting physician: Merlyn Lot MD _ _ _   _ __   _ __ _ _  __ __   _ __   __ _  History of Present Illness   This is a 56 yo woman with hx DM2, asthma, COPD. LKW 1000 when she developed tingling of her RUE and RLE (not face). No other deficits. NIHSS = 1 for sensory deficit. CT head NAICP (personal review). TNK was not administered 2/2 mild and improving sx. She has had numerous episodes of transient neurologic sx in the past, all of which she recovered from. Most recent admission for TIA was within past 30 days and workup at that time incl CT head, vascular imaging, and TTE were unremarkable. She has an appt scheduled with outpatient neurology to establish care regarding these transient events. She is not on anticoagulation but does take ASA 23m daily.    ROS   Per HPI: all other systems reviewed and are negative  Past History   I have reviewed the following:  Past Medical History:  Diagnosis Date   Anxiety    Arthritis    Asthma    Bipolar depression ([place]    CHF (congestive heart failure) (HCC)    Chronic headaches    migraines have lessened   Chronic kidney disease    ddstage 111   COPD (chronic obstructive pulmonary disease) (HCC)    Coronary artery disease    Depression    Diabetic neuropathy ([place]    Dyspnea    uses inhalers for this   Dysrhythmia    rapid   Fatty liver    GERD (gastroesophageal reflux disease)    Headache    [place]   Hyperlipidemia    Hypertension    Hypomagnesemia    Hypothyroid    Irregular heart rhythm    Liver lesion    Favored to be benign on CT; MRI of abd w/contract in Aug 2016. Liver Biopsy-Negative, March 2016   Morbid obesity ([place]    OSA (obstructive sleep apnea)    Peripheral vascular disease (HCC)    Pinched nerve    Back   Pneumonia    RLS  (restless legs syndrome)    Seasonal allergies    Sleep apnea    uses oxygen at night 2L d/t cpap did not fit   Type 2 diabetes, uncontrolled, with neuropathy 10/15/2015   Overview:  Microalbumin 7.7 12/2010: Hgb A1c 10.2% 12/2010 and 9.7% 03/2011, [facility]    Urinary incontinence    Vitamin D deficiency    Past Surgical History:  Procedure Laterality Date   ABDOMINAL HYSTERECTOMY     Partial   bladder stim  2007   BLADDER SURGERY     x 2. tacking of bladder and the other was the stimulator placement   CHOLECYSTECTOMY     COLONOSCOPY WITH PROPOFOL N/A 03/04/2019   Procedure: COLONOSCOPY WITH PROPOFOL;  Surgeon: WLucilla Lame MD;  [facility]  Service: Endoscopy;  Laterality: N/A;   ESOPHAGOGASTRODUODENOSCOPY (EGD) WITH PROPOFOL N/A 03/04/2019   Procedure: ESOPHAGOGASTRODUODENOSCOPY (EGD) WITH PROPOFOL;  Surgeon: WLucilla Lame MD;  Location: [facility] ENDOSCOPY;  Service: Endoscopy;  Laterality: N/A;   INCISION AND DRAINAGE ABSCESS Right 04/16/2019   Procedure: INCISION AND DRAINAGE ABSCESS;  Surgeon: SBenjamine Sprague DO;  Location: [facility] ORS;  Service: General;  Laterality: Right;   INTERSTIM-GENERATOR CHANGE N/A 12/31/2017   Procedure: INTERSTIM-GENERATOR CHANGE;  Surgeon: Bjorn Loser, MD;  Location: [facility] ORS;  Service: Urology;  Laterality: N/A;   LIVER BIOPSY  March 2016   Negative   LUNG BIOPSY  2015   small spot on lung   TUBAL LIGATION     UPPER GI ENDOSCOPY     WRIST SURGERY Right 11/02/2019   Family History  Problem Relation Age of Onset   Diabetes Mother    Heart disease Mother    Hyperlipidemia Mother    Hypertension Mother    Kidney disease Mother    Mental illness Mother    Hypothyroidism Mother    Stroke Mother    Osteoporosis Mother    Glaucoma Mother    Congestive Heart Failure Mother    Hypertension Father    Asthma Daughter    Cancer Daughter        throat   Bipolar disorder Daughter    Heart disease Maternal Grandfather     Rheum arthritis Maternal Grandfather    Cancer Maternal Grandfather        liver   Kidney disease Maternal Grandfather    Cancer Paternal Grandmother        lung   Kidney disease Brother    Breast cancer Maternal Grandmother 45   Bipolar disorder Daughter    Hypertension Daughter    Restless legs syndrome Daughter    Social History   Socioeconomic History   Marital status: Married    Spouse name: Not on file   Number of children: 2   Years of education: 12   Highest education level: Not on file  Occupational History   Occupation: disabled  Tobacco Use   Smoking status: Never   Smokeless tobacco: Never  Vaping Use   Vaping Use: Never used  Substance and Sexual Activity   Alcohol use: No   Drug use: No   Sexual activity: Yes    Partners: Male    Birth control/protection: Surgical  Other Topics Concern   Not on file  Social History Narrative   Pt lives with husband and grandchildren   Social Determinants of Health   Financial Resource Strain: Low Risk    Difficulty of Paying Living Expenses: Not hard at all  Food Insecurity: No Food Insecurity   Worried About Charity fundraiser in the Last Year: Never true   Arboriculturist in the Last Year: Never true  Transportation Needs: No Transportation Needs   Lack of Transportation (Medical): No   Lack of Transportation (Non-Medical): No  Physical Activity: Inactive   Days of Exercise per Week: 0 days   Minutes of Exercise per Session: 0 min  Stress: Stress Concern Present   Feeling of Stress : Rather much  Social Connections: Moderately Isolated   Frequency of Communication with Friends and Family: More than three times a week   Frequency of Social Gatherings with Friends and Family: More than three times a week   Attends Religious Services: Never   Marine scientist or Organizations: No   Attends Music therapist: Never   Marital Status: Married   Allergies  Allergen Reactions   Mirtazapine  Anaphylaxis    REACTION: closes throat   Morphine And Related     anaphylaxis   Sulfamethoxazole-Trimethoprim Rash    REACTION: Whelps all over   Vicodin Hp [Hydrocodone-Acetaminophen]    Amoxicillin Er Nausea And Vomiting  Pt doesn't remember problem with amoxicillin -  SE Has patient had a PCN reaction causing immediate rash, facial/tongue/throat swelling, SOB or lightheadedness with hypotension: No Has patient had a PCN reaction causing severe rash involving mucus membranes or skin necrosis: No Has patient had a PCN reaction that required hospitalization: No Has patient had a PCN reaction occurring within the last 10 years: Yes If all of the above answers are "NO", then may proceed with Cephalosporin use.   Codeine Itching    REACTION: itch, rash   Hydrocodone-Acetaminophen Rash    REACTION: rash   Keflex [Cephalexin] Rash    Pt does not remember having a reaction or rash, no airway involvement   Prasterone Nausea And Vomiting    Medications   (Not in a hospital admission)     Current Facility-Administered Medications:    sodium chloride flush (NS) 0.9 % injection 3 mL, 3 mL, Intravenous, Once, Merlyn Lot, MD  Current Outpatient Medications:    aspirin EC 81 MG EC tablet, Take 1 tablet (81 mg total) by mouth daily. Swallow whole., Disp: , Rfl:    atorvastatin (LIPITOR) 80 MG tablet, Take 80 mg by mouth at bedtime. , Disp: , Rfl:    busPIRone (BUSPAR) 30 MG tablet, Take 30 mg by mouth 2 (two) times daily., Disp: , Rfl:    citalopram (CELEXA) 20 MG tablet, Take 20 mg by mouth daily., Disp: , Rfl:    esomeprazole (NEXIUM) 40 MG capsule, Take 1 capsule (40 mg total) by mouth in the morning. MUST SCHEDULE OFFICE VISIT, Disp: 30 capsule, Rfl: 1   ezetimibe (ZETIA) 10 MG tablet, Take 10 mg by mouth at bedtime., Disp: , Rfl:    fenofibrate micronized (LOFIBRA) 134 MG capsule, Take 134 mg by mouth at bedtime., Disp: , Rfl:    fluticasone (FLONASE) 50 MCG/ACT nasal spray,  Place 2 sprays into both nostrils daily., Disp: 16 g, Rfl: 2   gabapentin (NEURONTIN) 300 MG capsule, Take 300 mg by mouth 2 (two) times daily., Disp: , Rfl:    gabapentin (NEURONTIN) 600 MG tablet, Take 600 mg by mouth at bedtime., Disp: , Rfl:    GLYXAMBI 25-5 MG TABS, Take 1 tablet by mouth daily., Disp: , Rfl:    HUMULIN R U-500 KWIKPEN 500 UNIT/ML kwikpen, Inject 70 Units into the skin 3 (three) times daily with meals., Disp: , Rfl:    Insulin Pen Needle (PEN NEEDLES) 31G X 8 MM MISC, , Disp: , Rfl:    lamoTRIgine (LAMICTAL) 200 MG tablet, Take 200 mg by mouth 2 (two) times daily. , Disp: , Rfl:    lisinopril (ZESTRIL) 2.5 MG tablet, Take 1 tablet (2.5 mg total) by mouth daily., Disp: 90 tablet, Rfl: 0   metoprolol tartrate (LOPRESSOR) 25 MG tablet, Take 1 tablet (25 mg total) by mouth 2 (two) times daily., Disp: 180 tablet, Rfl: 3   nitroGLYCERIN (NITROSTAT) 0.4 MG SL tablet, Place 1 tablet (0.4 mg total) under the tongue every 5 (five) minutes as needed for chest pain., Disp: 25 tablet, Rfl: 0   OneTouch Delica Lancets 26Z MISC, by Does not apply route., Disp: , Rfl:    promethazine (PHENERGAN) 12.5 MG tablet, TAKE 1 TABLET BY MOUTH EVERY 6 HOURS AS NEEDED FOR NAUSEA OR VOMITING, Disp: 30 tablet, Rfl: 1   QUEtiapine (SEROQUEL) 200 MG tablet, Take 200 mg by mouth at bedtime. Along with 362m dose for a total of 5052m Disp: , Rfl:    QUEtiapine (SEROQUEL) 300 MG tablet,  Take 300 mg by mouth at bedtime. Along with 251m dose for a total of 5044m Disp: , Rfl:    SUMAtriptan (IMITREX) 100 MG tablet, TAKE 1 TABLET BY MOUTH ONCE FOR 1 DOSE. MAY REPEAT IN 2 HOURS IF HEADACHE PERSISTS OR RECURS., Disp: 10 tablet, Rfl: 2   SYNTHROID 100 MCG tablet, Take 100 mcg by mouth daily., Disp: , Rfl:    TRULICITY 1.5 MGUO/3.7GBOPN, Inject 1.5 mg into the skin once a week. (Wednesdays), Disp: , Rfl:   Vitals   Vitals:   10/31/21 1212 10/31/21 1231  BP: 95/64 (!) 113/50  Pulse: 80 83  Resp: 16 16  Temp:  98 F (36.7 C)   TempSrc: Oral   SpO2: 93% 95%  Weight: 93 kg   Height: 4' 11"  (1.499 m)      Body mass index is 41.41 kg/m.  Physical Exam   Physical Exam Gen: A&O x4, NAD HEENT: Atraumatic, normocephalic;mucous membranes moist; oropharynx clear, tongue without atrophy or fasciculations. Neck: Supple, trachea midline. Resp: CTAB, no w/r/r CV: RRR, no m/g/r; nml S1 and S2. 2+ symmetric peripheral pulses. Abd: soft/NT/ND; nabs x 4 quad Extrem: Nml bulk; no cyanosis, clubbing, or edema.  Neuro: *MS: A&O x4. Follows multi-step commands.  *Speech: fluid, nondysarthric, able to name and repeat *CN:    I: Deferred   II,III: PERRLA, VFF by confrontation, optic discs unable to be visualized 2/2 pupillary constriction   III,IV,VI: EOMI w/o nystagmus, no ptosis   V: Sensation intact from V1 to V3 to LT   VII: Eyelid closure was full.  Smile symmetric.   VIII: Hearing intact to voice   IX,X: Voice normal, palate elevates symmetrically    XI: SCM/trap 5/5 bilat   XII: Tongue protrudes midline, no atrophy or fasciculations   *Motor:   Normal bulk.  No tremor, rigidity or bradykinesia. No pronator drift.    Strength: Dlt Bic Tri WrE WrF FgS Gr HF KnF KnE PlF DoF    Left 5 5 5 5 5 5 5 5 5 5 5 5     Right 5 5 5 5 5 5 5 5 5 5 5 5     *Sensory: Impaired to LT RUE and RLE only *Coordination:  Finger-to-nose, heel-to-shin, rapid alternating motions were intact. *Reflexes:  2+ and symmetric throughout without clonus; toes down-going bilat *Gait: deferred  NIHSS = 1  Premorbid mRS = 0   Labs   CBC: No results for input(s): WBC, NEUTROABS, HGB, HCT, MCV, PLT in the last 168 hours.  Basic Metabolic Panel:  Lab Results  Component Value Date   NA 139 10/04/2021   K 4.7 10/04/2021   CO2 23 10/04/2021   GLUCOSE 152 (H) 10/04/2021   BUN 25 (H) 10/04/2021   CREATININE 1.34 (H) 10/04/2021   CALCIUM 8.6 (L) 10/04/2021   GFRNONAA 46 (L) 10/04/2021   GFRAA 61 06/18/2021   Lipid  Panel:  Lab Results  Component Value Date   LDLCALC 11 10/05/2021   HgbA1c:  Lab Results  Component Value Date   HGBA1C 8.3 (H) 10/05/2021   Urine Drug Screen:     Component Value Date/Time   LABOPIA NONE DETECTED 11/29/2020 1915   COCAINSCRNUR NONE DETECTED 11/29/2020 1915   LABBENZ NONE DETECTED 11/29/2020 1915   AMPHETMU NONE DETECTED 11/29/2020 1915   THCU NONE DETECTED 11/29/2020 1915   LABBARB NONE DETECTED 11/29/2020 1915    Alcohol Level     Component Value Date/Time   ETH <10 03/31/2020 1634  Impression   This is a 56 yo woman with hx DM2, asthma, COPD. LKW 1000 when she developed tingling of her RUE and RLE (not face). No other deficits. NIHSS = 1 for sensory deficit. CT head NAICP (personal review). TNK was not administered 2/2 mild and improving sx. She has had numerous episodes of transient neurologic sx in the past, all of which she recovered from. Most recent admission for TIA was within past 30 days and workup at that time incl CT head, vascular imaging, and TTE were unremarkable. She has an appt scheduled with outpatient neurology to establish care regarding these transient events.   Recommendations   - No indication for admission for stroke/TIA workup given that she had this workup within past 30 days and it was unrevealing. Patient had bladder stimulator and cannot get MRI - MRI c spine wo contrast, if negative may be discharged from ED on plavix 10m daily (d/c ASA unless there is a cardiac indication to continue it) - Patient is already scheduled for new patient appt with outpatient neurology  D/w Dr. RQuentin Cornwallwho will call neuro if CT c spine shows abnl findings. ______________________________________________________________________   Thank you for the opportunity to take part in the care of this patient. If you have any further questions, please contact the neurology consultation attending.  Signed,  CSu Monks MD [facility] [phone] If 7pm- 7am, please page neurology on call as listed in [place]

## 2021-10-31 NOTE — Code Documentation (Signed)
Stroke Response Nurse Documentation Code Documentation  [name] is a 56 y.o. female arriving to [facility] ED via [place] on 2/27 with past medical hx of TIA. On aspirin 81 mg daily. Code stroke was activated by Triage RN.   Patient from home where she was LKW at 1015 and now complaining of rt sided numbness and dizziness. Pt states that she went grocery shopping and was getting back into her car when she suddenly felt dizzy and had numbness to rt arm and leg.   Stroke team at the bedside on patient arrival. Labs drawn and patient cleared for CT by Ashok Cordia, PA. Patient to CT with team. NIHSS 1, see documentation for details and code stroke times. The following imaging was completed:  CT head. Patient is not a candidate for IV Thrombolytic due to mild sx's.  Care/Plan: Pt to be admitted for stroke/TIA work up per Dr Quinn Axe.   Bedside handoff with ED RN Lynn Ito.    Velta Addison Stroke Designer, fashion/clothing

## 2021-10-31 NOTE — ED Notes (Signed)
Followed up trop result with lab. Per tech, machine was broken and Trop now running. Dr. Quentin Cornwall notified.

## 2021-10-31 NOTE — ED Notes (Signed)
Pt requested for nurse to call husband to give her ride home. This RN updated husband, husband's on his way ETA 33mns.

## 2021-10-31 NOTE — Discharge Instructions (Addendum)
Neurology has recommended that you start taking Plavix 3m daily.  Stop taking aspirin. Please follow up with neurology clinic.

## 2021-11-02 ENCOUNTER — Ambulatory Visit: Payer: Self-pay

## 2021-11-02 ENCOUNTER — Other Ambulatory Visit: Payer: Self-pay

## 2021-11-02 NOTE — Telephone Encounter (Signed)
Pt called to schedule an appt today/ she stated she has a sore throat and feels flush as well as having the feeling of wanting to vomit /  noo appts available today / pt was scheduled for telephone visit for tomorrow at 4pm/ please advise   ?  ? ?Chief Complaint: Sore throat, nausea ?Symptoms: Runny nose, headache. ?Frequency: Started yesterday ?Pertinent Negatives: Patient denies fever ?Disposition: [] ED /[] Urgent Care (no appt availability in office) / [x] Appointment(In office/virtual)/ []  [facility]/ [] Home Care/ [] Refused Recommended Disposition /[] [facility]/ []  Follow-up with PCP ?Additional Notes: Has virtual visit for tomorrow. Pt. Content with appointment.  ?Answer Assessment - Initial Assessment Questions ?1. ONSET: "When did the throat start hurting?" (Hours or days ago)  ?    Yesterday ?2. SEVERITY: "How bad is the sore throat?" (Scale 1-10; mild, moderate or severe) ?  - MILD (1-3):  doesn't interfere with eating or normal activities ?  - MODERATE (4-7): interferes with eating some solids and normal activities ?  - SEVERE (8-10):  excruciating pain, interferes with most normal activities ?  - SEVERE DYSPHAGIA: can't swallow liquids, drooling ?    Moderate ?3. STREP EXPOSURE: "Has there been any exposure to strep within the past week?" If Yes, ask: "What type of contact occurred?"  ?    No ?4.  VIRAL SYMPTOMS: "Are there any symptoms of a cold, such as a runny nose, cough, hoarse voice or red eyes?"  ?    Runny nose, nausea ?5. FEVER: "Do you have a fever?" If Yes, ask: "What is your temperature, how was it measured, and when did it start?" ?    No ?6. PUS ON THE TONSILS: "Is there pus on the tonsils in the back of your throat?" ?    No ?7. OTHER SYMPTOMS: "Do you have any other symptoms?" (e.g., difficulty breathing, headache, rash) ?    Headache ?8. PREGNANCY: "Is there any chance you are pregnant?" "When was your last menstrual period?" ?    No ? ?Protocols used: Sore  Throat-A-AH ? ?

## 2021-11-03 ENCOUNTER — Other Ambulatory Visit: Payer: Self-pay

## 2021-11-03 ENCOUNTER — Telehealth: Payer: Self-pay

## 2021-11-03 ENCOUNTER — Ambulatory Visit (INDEPENDENT_AMBULATORY_CARE_PROVIDER_SITE_OTHER): Payer: Medicare Other | Admitting: Nurse Practitioner

## 2021-11-03 ENCOUNTER — Encounter: Payer: Self-pay | Admitting: Nurse Practitioner

## 2021-11-03 DIAGNOSIS — J069 Acute upper respiratory infection, unspecified: Secondary | ICD-10-CM | POA: Diagnosis not present

## 2021-11-03 MED ORDER — CETIRIZINE HCL 10 MG PO TABS
10.0000 mg | ORAL_TABLET | Freq: Every day | ORAL | 0 refills | Status: DC
Start: 2021-11-03 — End: 2022-09-13

## 2021-11-03 MED ORDER — BENZONATATE 100 MG PO CAPS: 200.0000 mg | ORAL_CAPSULE | Freq: Two times a day (BID) | ORAL | 0 refills | Status: DC | PRN

## 2021-11-03 NOTE — Telephone Encounter (Signed)
Copied from [place] [phone]. Topic: General - Other ?>> Nov 03, 2021  9:40 AM [name], [name] wrote: ?Reason for CRM: Pt stated she is not able to come by the office today for Yang Covid test, pt wanted to make sure it was okay she came by tomorrow 3/3 in the morning. ?  ? FYI ? ?

## 2021-11-03 NOTE — Progress Notes (Signed)
Name: [name]   MRN: [id number]    DOB: October 22, 1965   Date:11/03/2021       Progress Note  Subjective  Chief Complaint  Chief Complaint  Patient presents with   Sinusitis    I connected with  Zena Amos Schuff  on 11/03/21 at 9:30 am by telephone enabled telemedicine application and verified that I am speaking with the correct person using two identifiers.  I discussed the limitations of evaluation and management by telemedicine and the availability of in person appointments. The patient expressed understanding and agreed to proceed with a virtual visit  Staff also discussed with the patient that there may be a patient responsible charge related to this service. Patient Location: home Provider Location: [facility] Additional Individuals present: alone  HPI  URI: She says she has had cough, nasal congestion, headache, facial pressure since yesterday.  She denies any fever or shortness of breath. Discussed coming and getting tested for flu and coivd and she agreed. We also discussed treatment options if positive and she would like to take the treatment. Discussed OTC treatments for symptoms.  She says she already takes flonase, discussed mucinex and zyrtec.  Push fluids and get rest. Will send in prescription for tessalon perls.   Patient Active Problem List   Diagnosis Date Noted   TIA (transient ischemic attack) 10/04/2021   HTN (hypertension) 10/04/2021   Chronic kidney disease, stage 3a ([place]) 10/04/2021   Depression with anxiety 10/04/2021   COPD (chronic obstructive pulmonary disease) ([place]) 10/04/2021   CAD (coronary artery disease) 10/04/2021   Chronic diastolic CHF (congestive heart failure) ([place]) 10/04/2021   Type II diabetes mellitus with renal manifestations ([place]) 06/30/2021   Interstitial cystitis 06/30/2021   Atherosclerosis of aorta ([place]) 03/28/2021   Iron deficiency anemia due to chronic blood loss    Polyp of ascending colon    Vitamin B12 deficiency 08/08/2018   Patient  is full code 10/30/2017   Coronary artery disease of native artery of native heart with stable angina pectoris ([place]) 10/09/2017   Plantar fascial fibromatosis 10/09/2017   Tendinitis of wrist 10/09/2017   Schizoaffective disorder, bipolar type ([place]) 09/28/2017   Adenomatous colon polyp 10/15/2015   Bipolar affective disorder ([place]) 10/15/2015   CN (constipation) 10/15/2015   HLD (hyperlipidemia) 10/15/2015   NASH (nonalcoholic steatohepatitis) 10/15/2015   Obesity (BMI 35.0-39.9 without comorbidity) 10/15/2015   Restless leg syndrome 10/15/2015   Low back pain with left-sided sciatica 08/18/2015   Apnea, sleep 05/05/2015   Heart valve disease 05/05/2015   Chronic kidney disease, stage III (moderate) ([place]) 03/15/2015   Migraine 03/12/2015   Chronic bronchitis ([place]) 01/20/2015   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[place]) 01/20/2015   GERD (gastroesophageal reflux disease) 01/20/2015   Hypothyroid    Vitamin D deficiency    Urinary incontinence    Liver lesion    Hypomagnesemia    Obstructive sleep apnea 10/20/2013   Allergic rhinitis 07/04/2007    Social History   Tobacco Use   Smoking status: Never   Smokeless tobacco: Never  Substance Use Topics   Alcohol use: No     Current Outpatient Medications:    atorvastatin (LIPITOR) 80 MG tablet, Take 80 mg by mouth at bedtime. , Disp: , Rfl:    busPIRone (BUSPAR) 30 MG tablet, Take 30 mg by mouth 2 (two) times daily., Disp: , Rfl:    citalopram (CELEXA) 20 MG tablet, Take 20 mg by mouth daily., Disp: , Rfl:    clopidogrel (PLAVIX)  75 MG tablet, Take 1 tablet (75 mg total) by mouth daily., Disp: 30 tablet, Rfl: 11   esomeprazole (NEXIUM) 40 MG capsule, Take 1 capsule (40 mg total) by mouth in the morning. MUST SCHEDULE OFFICE VISIT, Disp: 30 capsule, Rfl: 1   ezetimibe (ZETIA) 10 MG tablet, Take 10 mg by mouth at bedtime., Disp: , Rfl:    fenofibrate micronized (LOFIBRA) 134 MG capsule, Take 134 mg by mouth at bedtime., Disp: , Rfl:     fluticasone (FLONASE) 50 MCG/ACT nasal spray, Place 2 sprays into both nostrils daily., Disp: 16 g, Rfl: 2   gabapentin (NEURONTIN) 300 MG capsule, Take 300 mg by mouth 2 (two) times daily., Disp: , Rfl:    gabapentin (NEURONTIN) 600 MG tablet, Take 600 mg by mouth at bedtime., Disp: , Rfl:    GLYXAMBI 25-5 MG TABS, Take 1 tablet by mouth daily., Disp: , Rfl:    HUMULIN R U-500 KWIKPEN 500 UNIT/ML kwikpen, Inject 70 Units into the skin 3 (three) times daily with meals., Disp: , Rfl:    Insulin Pen Needle (PEN NEEDLES) 31G X 8 MM MISC, , Disp: , Rfl:    lamoTRIgine (LAMICTAL) 200 MG tablet, Take 200 mg by mouth 2 (two) times daily. , Disp: , Rfl:    lisinopril (ZESTRIL) 2.5 MG tablet, Take 1 tablet (2.5 mg total) by mouth daily., Disp: 90 tablet, Rfl: 0   metoprolol tartrate (LOPRESSOR) 25 MG tablet, Take 1 tablet (25 mg total) by mouth 2 (two) times daily., Disp: 180 tablet, Rfl: 3   nitroGLYCERIN (NITROSTAT) 0.4 MG SL tablet, Place 1 tablet (0.4 mg total) under the tongue every 5 (five) minutes as needed for chest pain., Disp: 25 tablet, Rfl: 0   OneTouch Delica Lancets 86V MISC, by Does not apply route., Disp: , Rfl:    promethazine (PHENERGAN) 12.5 MG tablet, TAKE 1 TABLET BY MOUTH EVERY 6 HOURS AS NEEDED FOR NAUSEA OR VOMITING, Disp: 30 tablet, Rfl: 1   QUEtiapine (SEROQUEL) 200 MG tablet, Take 200 mg by mouth at bedtime. Along with 364m dose for a total of 5039m Disp: , Rfl:    QUEtiapine (SEROQUEL) 300 MG tablet, Take 300 mg by mouth at bedtime. Along with 20059mose for a total of 500m21misp: , Rfl:    SUMAtriptan (IMITREX) 100 MG tablet, TAKE 1 TABLET BY MOUTH ONCE FOR 1 DOSE. MAY REPEAT IN 2 HOURS IF HEADACHE PERSISTS OR RECURS., Disp: 10 tablet, Rfl: 2   SYNTHROID 100 MCG tablet, Take 100 mcg by mouth daily., Disp: , Rfl:    TRULICITY 1.5 MG/0HQ/4.6NGN, Inject 1.5 mg into the skin once a week. (Wednesdays), Disp: , Rfl:   Allergies  Allergen Reactions   Mirtazapine Anaphylaxis     REACTION: closes throat   Morphine And Related     anaphylaxis   Sulfamethoxazole-Trimethoprim Rash    REACTION: Whelps all over   Vicodin Hp [Hydrocodone-Acetaminophen]    Amoxicillin Er Nausea And Vomiting    Pt doesn't remember problem with amoxicillin -  SE Has patient had a PCN reaction causing immediate rash, facial/tongue/throat swelling, SOB or lightheadedness with hypotension: No Has patient had a PCN reaction causing severe rash involving mucus membranes or skin necrosis: No Has patient had a PCN reaction that required hospitalization: No Has patient had a PCN reaction occurring within the last 10 years: Yes If all of the above answers are "NO", then may proceed with Cephalosporin use.   Codeine Itching    REACTION: itch,  rash   Hydrocodone-Acetaminophen Rash    REACTION: rash   Keflex [Cephalexin] Rash    Pt does not remember having a reaction or rash, no airway involvement   Prasterone Nausea And Vomiting    I personally reviewed active problem list, medication list, allergies, notes from last encounter with the patient/caregiver today.  ROS  Constitutional: Negative for fever or weight change.  HEENT: positive for nasal congestion and facial pressure Respiratory: Positive for cough and negative for shortness of breath.   Cardiovascular: Negative for chest pain or palpitations.  Gastrointestinal: Negative for abdominal pain, no bowel changes.  Musculoskeletal: Negative for gait problem or joint swelling.  Skin: Negative for rash.  Neurological: Negative for dizziness, positive for  headache.  No other specific complaints in a complete review of systems (except as listed in HPI above).   Objective  Virtual encounter, vitals not obtained.  There is no height or weight on file to calculate BMI.  Nursing Note and Vital Signs reviewed.  Physical Exam  Awake, alert and oriented, speaking in complete sentences  Results for orders placed or performed during the  hospital encounter of 10/31/21 (from the past 72 hour(s))  CBG monitoring, ED     Status: Abnormal   Collection Time: 10/31/21 12:16 PM  Result Value Ref Range   Glucose-Capillary 126 (H) 70 - 99 mg/dL    Comment: Glucose reference range applies only to samples taken after fasting for at least 8 hours.  Protime-INR     Status: None   Collection Time: 10/31/21 12:42 PM  Result Value Ref Range   Prothrombin Time 12.6 11.4 - 15.2 seconds   INR 1.0 0.8 - 1.2    Comment: (NOTE) INR goal varies based on device and disease states. Performed at [facility], [address] [place], [place] [address]   APTT     Status: None   Collection Time: 10/31/21 12:42 PM  Result Value Ref Range   aPTT 27 24 - 36 seconds    Comment: Performed at [facility], [address] [place], [place] [address]  CBC     Status: Abnormal   Collection Time: 10/31/21 12:42 PM  Result Value Ref Range   WBC 7.0 4.0 - 10.5 K/uL   RBC 4.08 3.87 - 5.11 MIL/uL   Hemoglobin 11.9 (L) 12.0 - 15.0 g/dL   HCT 38.3 36.0 - 46.0 %   MCV 93.9 80.0 - 100.0 fL   MCH 29.2 26.0 - 34.0 pg   MCHC 31.1 30.0 - 36.0 g/dL   RDW 14.0 11.5 - 15.5 %   Platelets 244 150 - 400 K/uL   nRBC 0.0 0.0 - 0.2 %    Comment: Performed at [facility], [address] [place], [place] [address]  Differential     Status: Abnormal   Collection Time: 10/31/21 12:42 PM  Result Value Ref Range   Neutrophils Relative % 50 %   Neutro Abs 3.5 1.7 - 7.7 K/uL   Lymphocytes Relative 36 %   Lymphs Abs 2.5 0.7 - 4.0 K/uL   Monocytes Relative 10 %   Monocytes Absolute 0.7 0.1 - 1.0 K/uL   Eosinophils Relative 3 %   Eosinophils Absolute 0.2 0.0 - 0.5 K/uL   Basophils Relative 0 %   Basophils Absolute 0.0 0.0 - 0.1 K/uL   Immature Granulocytes 1 %   Abs Immature Granulocytes 0.09 (H) 0.00 - 0.07 K/uL    Comment: Performed at [facility], [address] [place], [place] [address]  Comprehensive metabolic panel      Status: Abnormal   Collection Time: 10/31/21 12:42 PM  Result Value Ref Range   Sodium 141 135 - 145 mmol/L   Potassium 4.2 3.5 - 5.1 mmol/L   Chloride 103 98 - 111 mmol/L   CO2 26 22 - 32 mmol/L   Glucose, Bld 145 (H) 70 - 99 mg/dL    Comment: Glucose reference range applies only to samples taken after fasting for at least 8 hours.   BUN 35 (H) 6 - 20 mg/dL   Creatinine, Ser 1.37 (H) 0.44 - 1.00 mg/dL   Calcium 10.0 8.9 - 10.3 mg/dL   Total Protein 6.0 (L) 6.5 - 8.1 g/dL   Albumin 3.5 3.5 - 5.0 g/dL   AST 50 (H) 15 - 41 U/L   ALT 42 0 - 44 U/L   Alkaline Phosphatase 48 38 - 126 U/L   Total Bilirubin 0.4 0.3 - 1.2 mg/dL   GFR, Estimated 45 (L) >60 mL/min    Comment: (NOTE) Calculated using the CKD-EPI Creatinine Equation (2021)    Anion gap 12 5 - 15    Comment: Performed at [facility], [place], [place] [address]  Troponin I (High Sensitivity)     Status: None   Collection Time: 10/31/21 12:42 PM  Result Value Ref Range   Troponin I (High Sensitivity) 5 <18 ng/L    Comment: (NOTE) Elevated high sensitivity troponin I (hsTnI) values and significant  changes across serial measurements may suggest ACS but many other  chronic and acute conditions are known to elevate hsTnI results.  Refer to the "Links" section for chest pain algorithms and additional  guidance. Performed at [facility], [address], [place], [place] [address]     Assessment & Plan  1. Viral upper respiratory tract infection -push fluids, get rest, keep using flonase add mucinex -will send in treatment for covid or flu if positive - cetirizine (ZYRTEC) 10 MG tablet; Take 1 tablet (10 mg total) by mouth daily.  Dispense: 30 tablet; Refill: 0 - benzonatate (TESSALON) 100 MG capsule; Take 2 capsules (200 mg total) by mouth 2 (two) times daily as needed for cough.  Dispense: 20 capsule; Refill: 0 - POCT Influenza A/B - Novel Coronavirus, NAA ([facility])   -Red flags  and when to present for emergency care or RTC including fever >101.58F, chest pain, shortness of breath, new/worsening/un-resolving symptoms,  reviewed with patient at time of visit. Follow up and care instructions discussed and provided in AVS. - I discussed the assessment and treatment plan with the patient. The patient was provided an opportunity to ask questions and all were answered. The patient agreed with the plan and demonstrated an understanding of the instructions.  I provided 20 minutes of non-face-to-face time during this encounter.  Bo Merino, FNP

## 2021-11-04 ENCOUNTER — Ambulatory Visit (INDEPENDENT_AMBULATORY_CARE_PROVIDER_SITE_OTHER): Payer: Medicare Other | Admitting: Emergency Medicine

## 2021-11-04 DIAGNOSIS — R051 Acute cough: Secondary | ICD-10-CM

## 2021-11-04 DIAGNOSIS — J069 Acute upper respiratory infection, unspecified: Secondary | ICD-10-CM | POA: Diagnosis not present

## 2021-11-04 LAB — POCT INFLUENZA A/B
Influenza A, POC: NEGATIVE
Influenza B, POC: NEGATIVE

## 2021-11-05 LAB — SPECIMEN STATUS REPORT

## 2021-11-05 LAB — NOVEL CORONAVIRUS, NAA: SARS-CoV-2, NAA: NOT DETECTED

## 2021-11-15 ENCOUNTER — Other Ambulatory Visit: Payer: Self-pay | Admitting: Gastroenterology

## 2021-11-18 ENCOUNTER — Other Ambulatory Visit: Payer: Self-pay | Admitting: Family Medicine

## 2021-11-18 DIAGNOSIS — G43009 Migraine without aura, not intractable, without status migrainosus: Secondary | ICD-10-CM

## 2021-11-30 IMAGING — CR DG CHEST 2V
1 series · 2 of 2 positions shown · non-contrast
Comparison: 11/02/2019

CLINICAL DATA: Weakness, dizziness

EXAM:
CHEST - 2 VIEW

[Series 1: dg chest 2 view · 0.14mm/px · 2 of 2 slices shown]
[im 1/2]
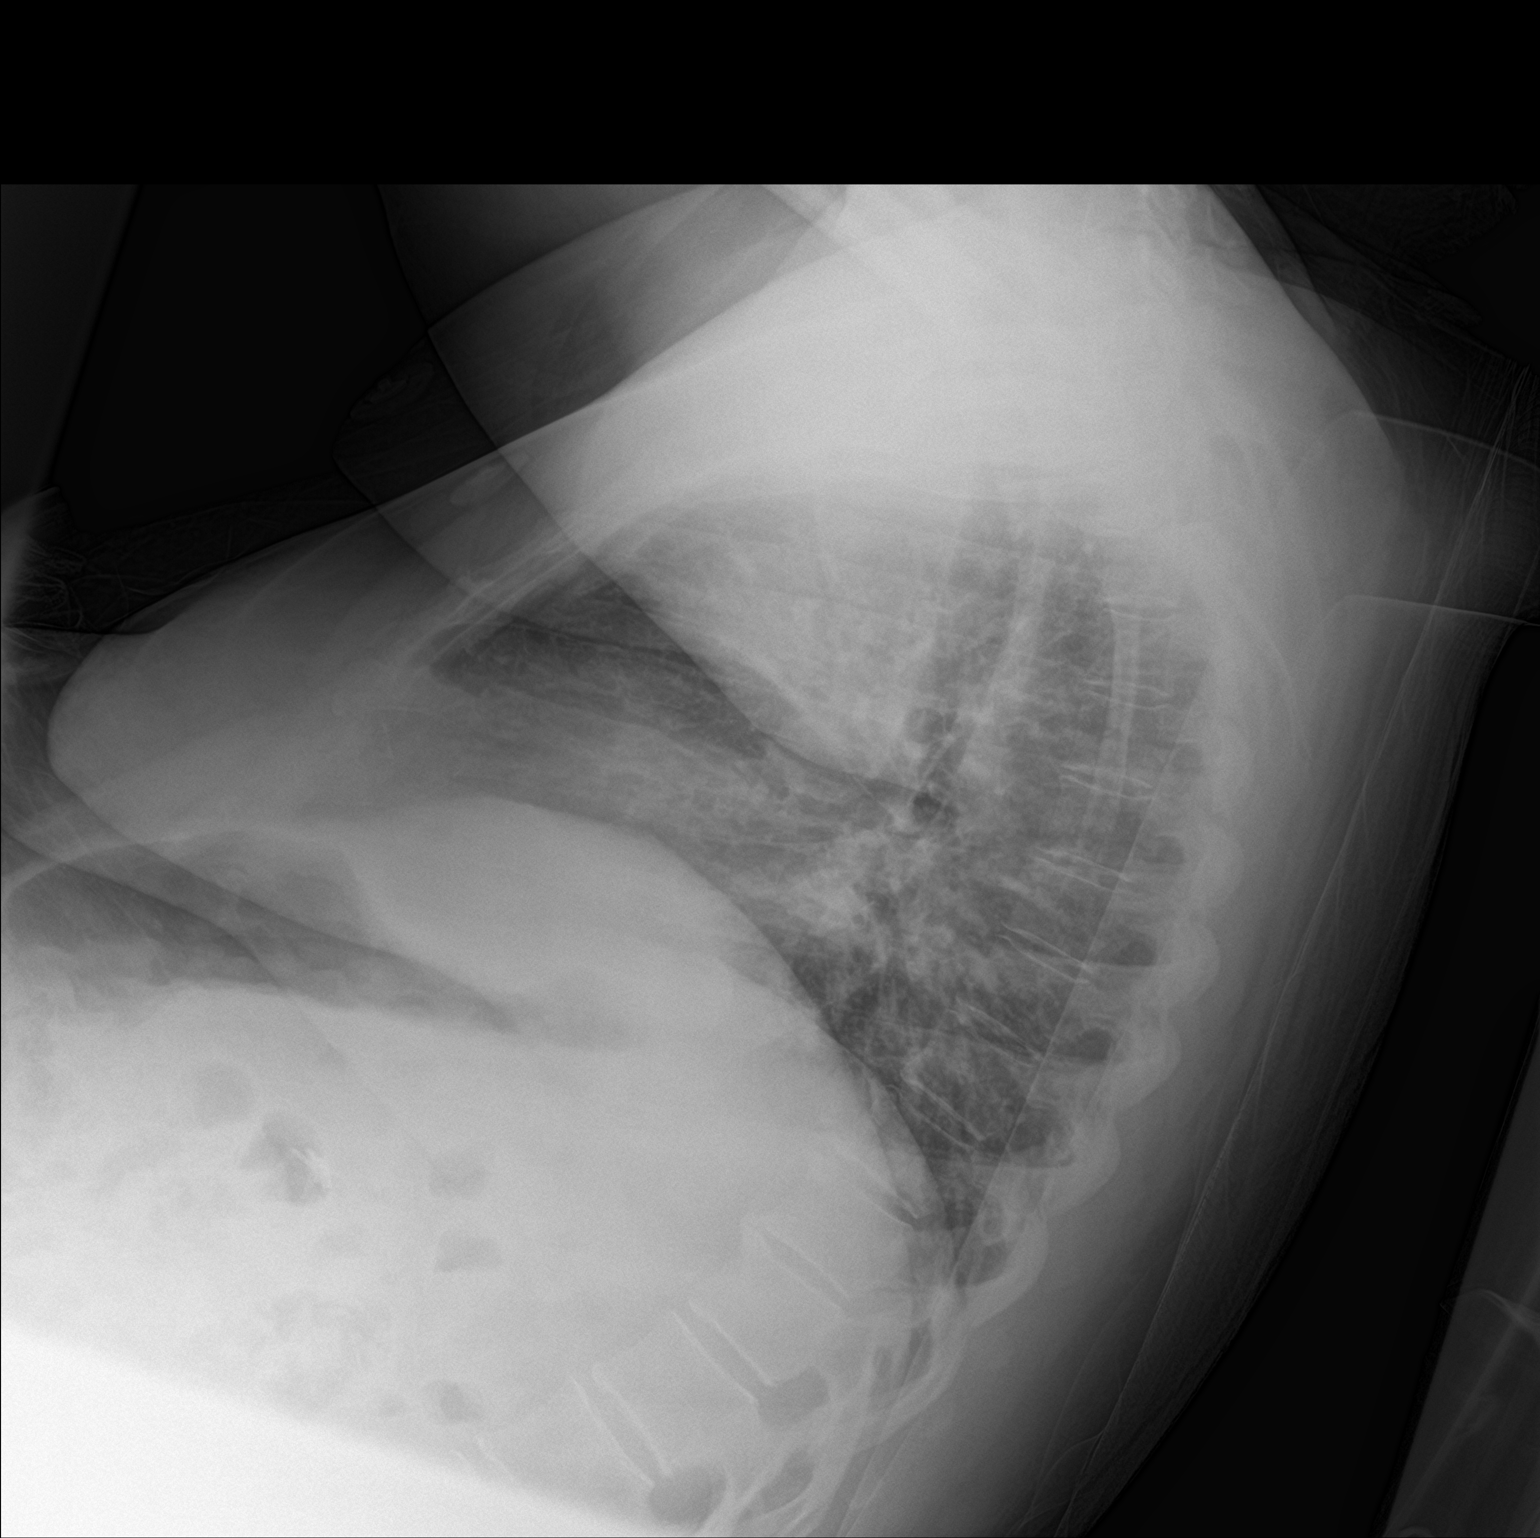
[im 2/2]
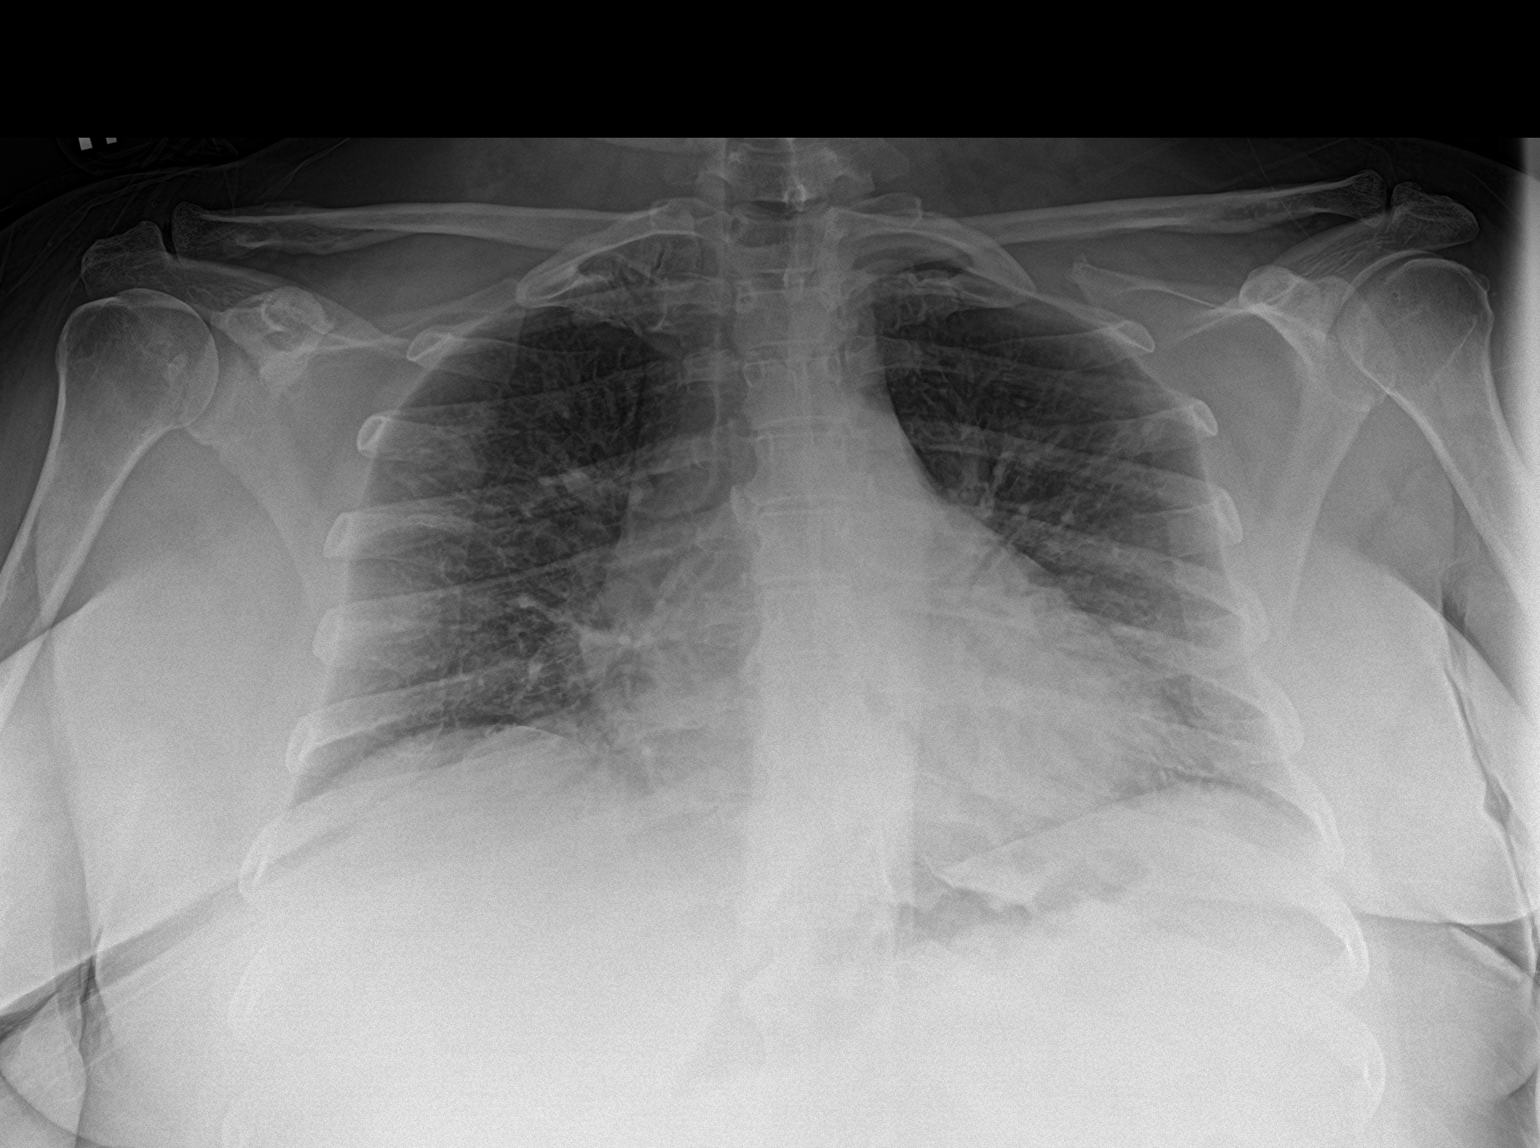

[2 of 2 positions shown; findings below may reference images not displayed]

FINDINGS: Low lung volumes. Unchanged mild elevation of the right
hemidiaphragm. Focal increased basilar density on the lateral view.
No pleural effusion. No pneumothorax. Mild cardiomegaly. No acute
osseous abnormality. Cholecystectomy clips.
IMPRESSION: There is focal increased basilar density on the lateral view likely
reflecting atelectasis/consolidation. Not definitely seen on the
multiple view.

## 2021-11-30 IMAGING — CR DG LUMBAR SPINE 2-3V
1 series · 3 of 3 positions shown · non-contrast
Comparison: January 16, 2017.

CLINICAL DATA: Pain following fall

EXAM:
LUMBAR SPINE - 2-3 VIEW

[Series 1: dg lumbar spine 2-3 views · 0.14mm/px · 3 of 3 slices shown]
[im 1/3]
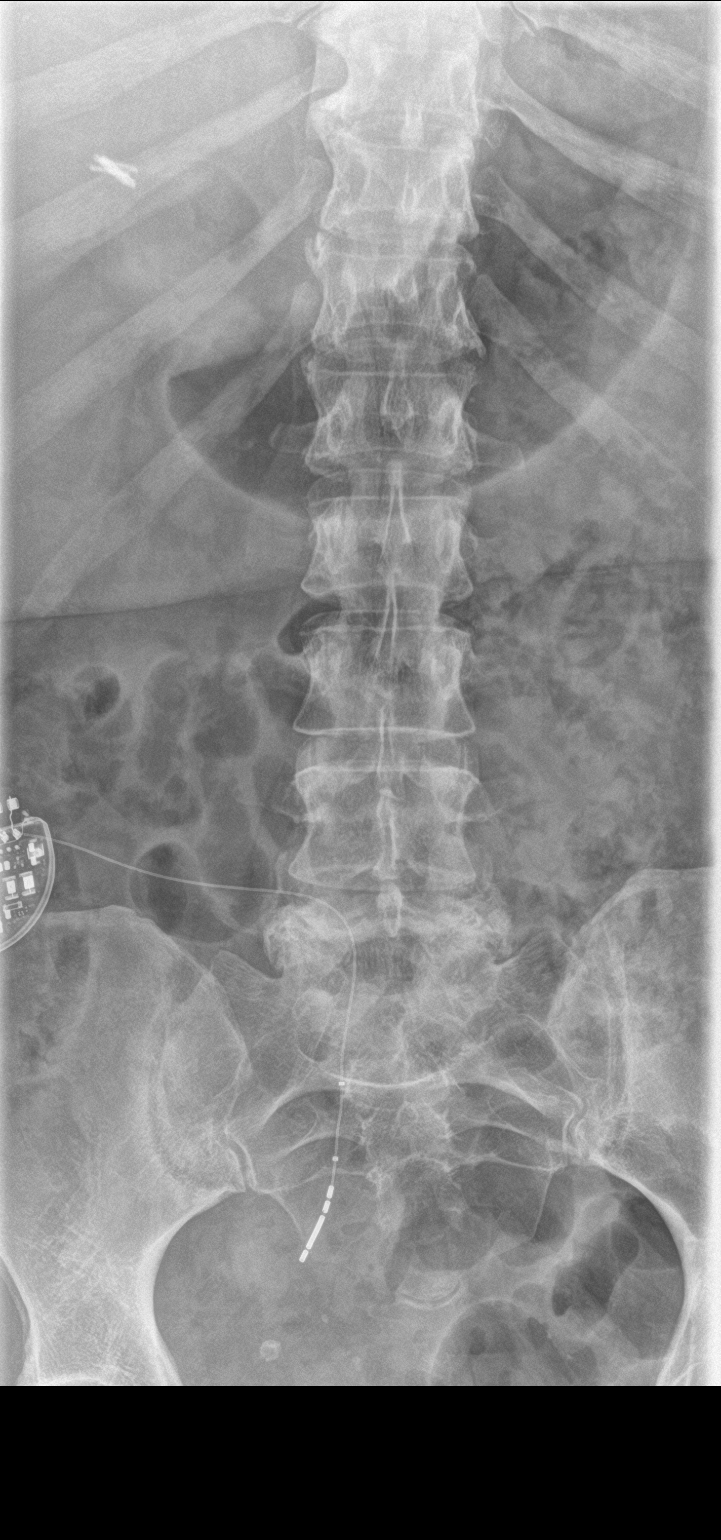
[im 2/3]
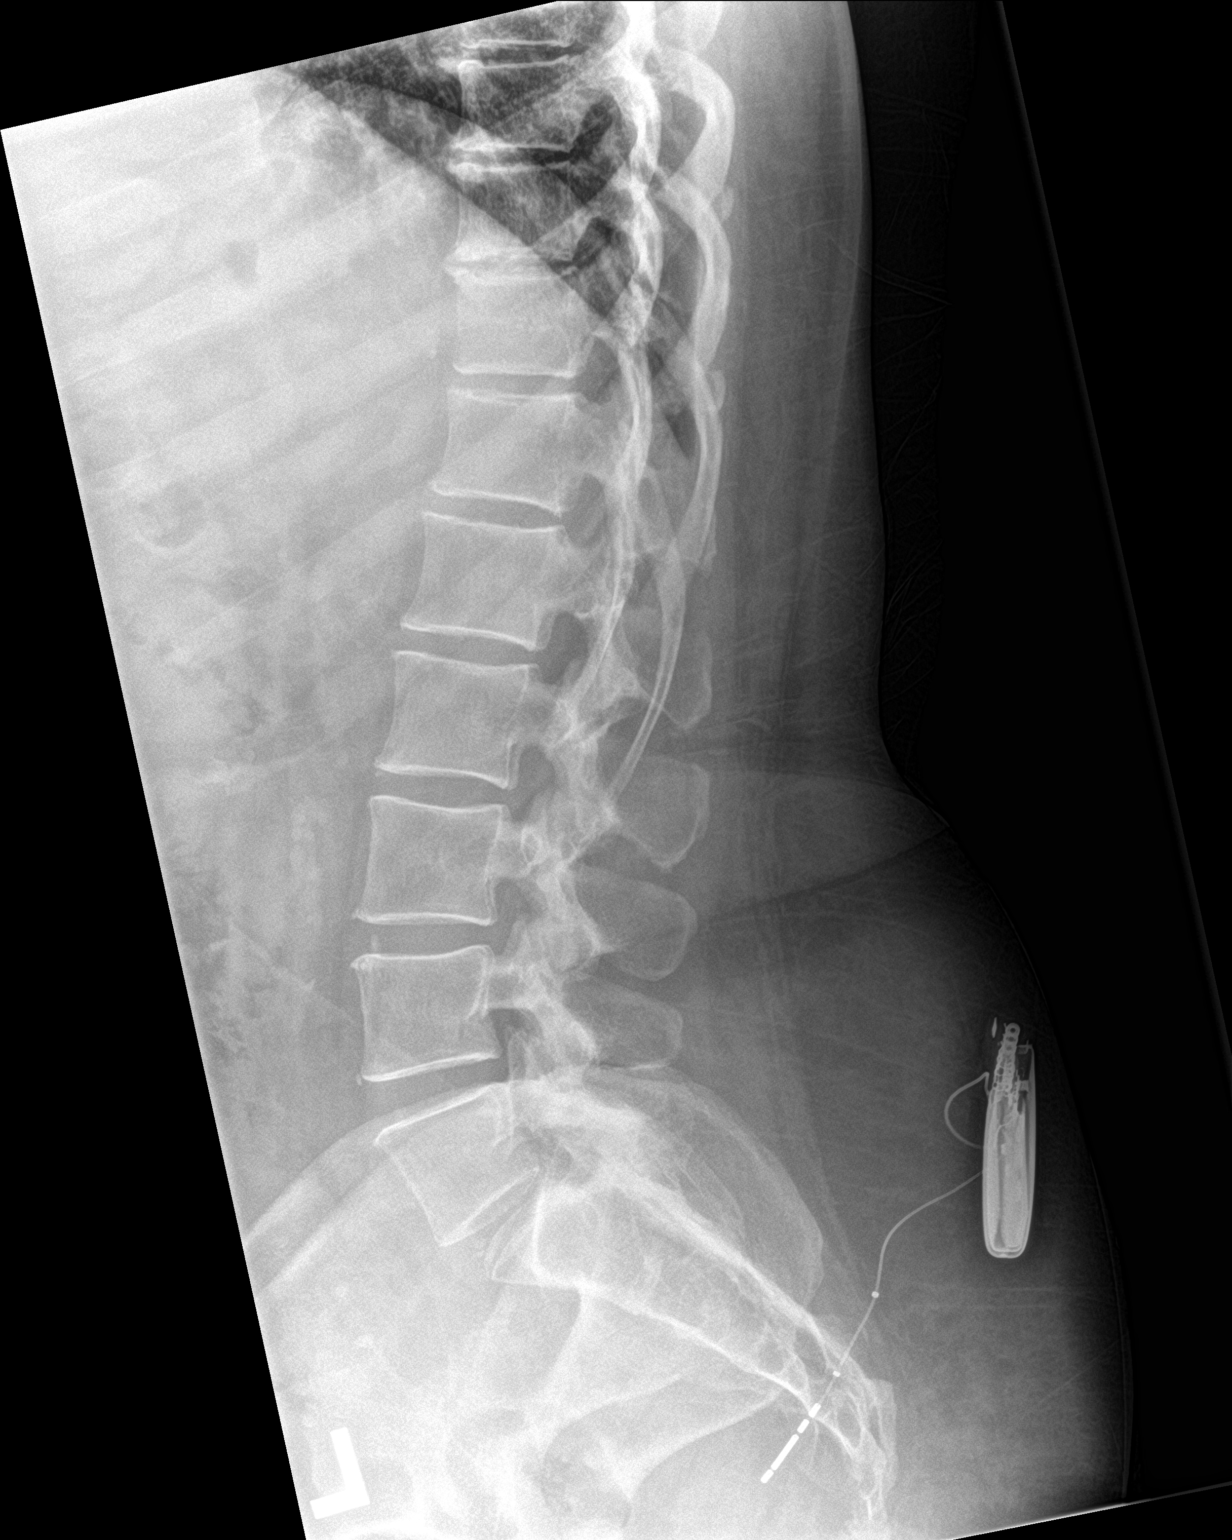
[im 3/3]
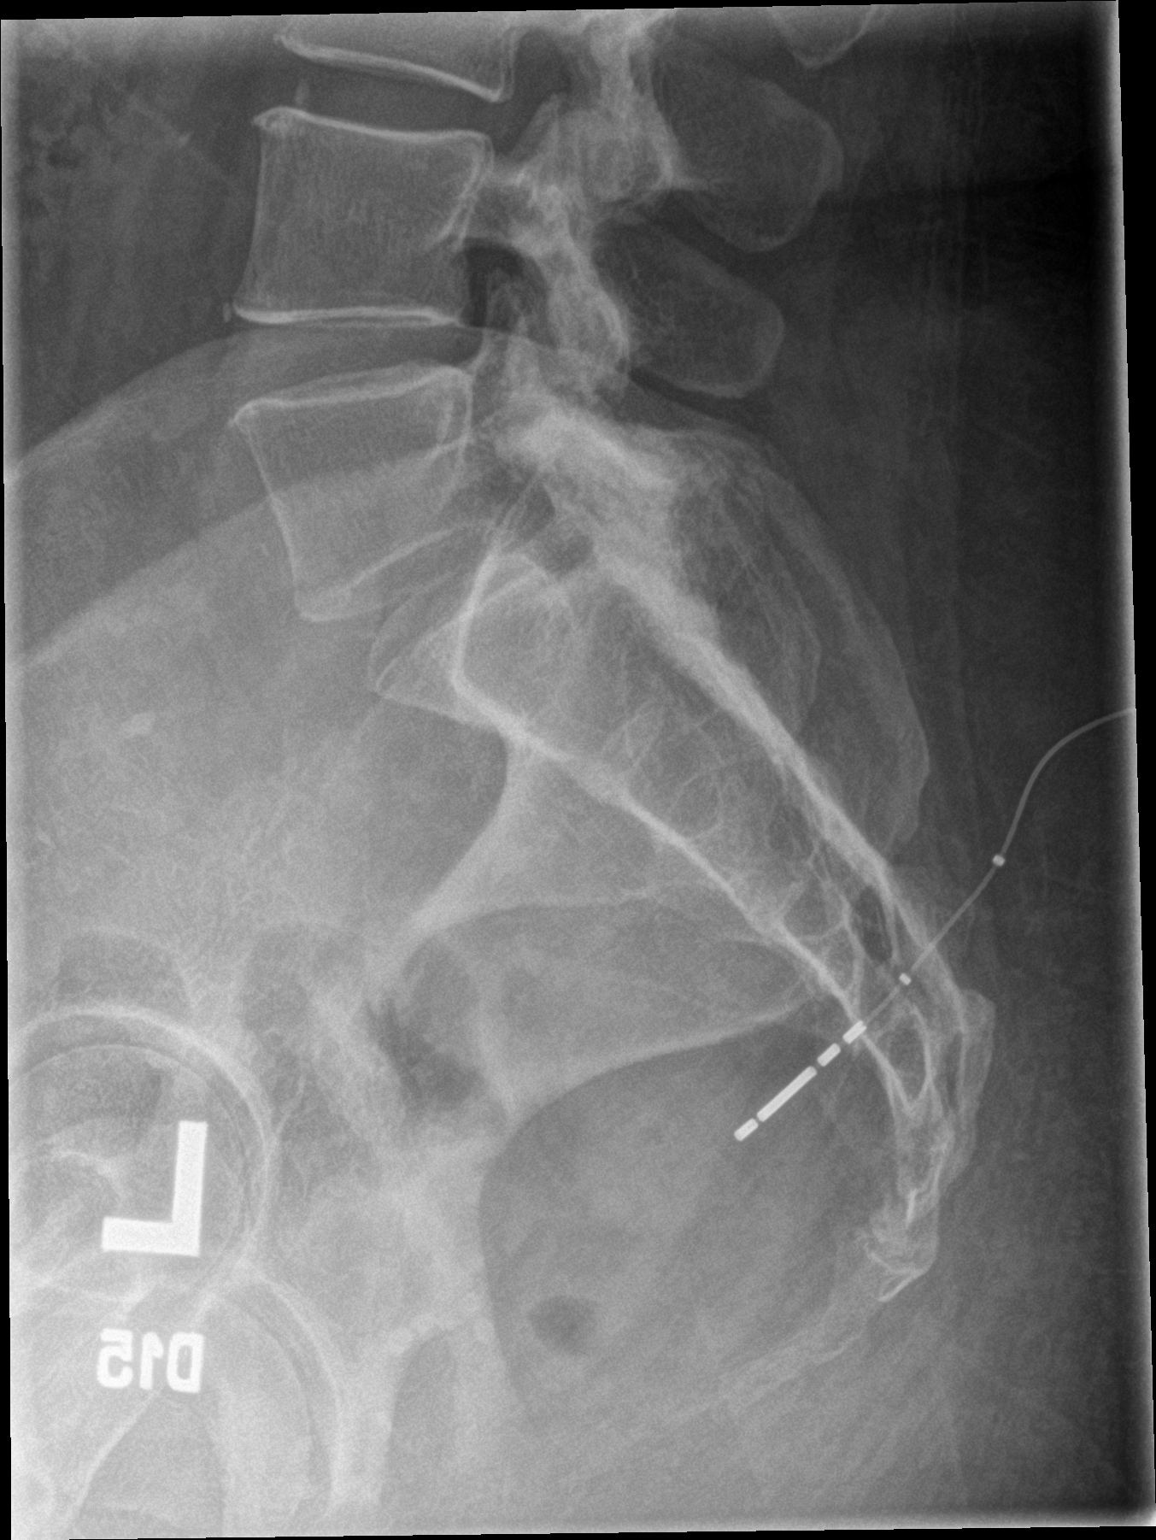

[3 of 3 positions shown; findings below may reference images not displayed]

FINDINGS: Frontal, lateral, and spot lumbosacral lateral images were obtained.
There are 5 non-rib-bearing lumbar type vertebral bodies. There is
no fracture or spondylolisthesis. The disc spaces appear normal.
There is facet osteoarthritic change at L5-S1 bilaterally. There is
a stimulator on the right with the lead tip anterior to the lower
sacrum. Surgical clips in right upper quadrant.
IMPRESSION: Osteoarthritic change in the facets at L5-S1 bilaterally. No
appreciable disc space narrowing. No fracture or spondylolisthesis.
Stable stimulator lead positioning.

## 2021-11-30 IMAGING — CT CT HEAD W/O CM
3 series · 14 of 45 positions shown, 16 images · non-contrast
Comparison: November 02, 2019

CLINICAL DATA: Dizziness, reported weakness over the past few days
multiple falls.

EXAM:
CT HEAD WITHOUT CONTRAST
TECHNIQUE: Contiguous axial images were obtained from the base of the skull
through the vertex without intravenous contrast.

[Series 3: ax head wo · axial · 0.33mm/px · z∈[+974,+1089]mm · 8 of 28 slices shown, 10 images]
[im 3/28  brain]
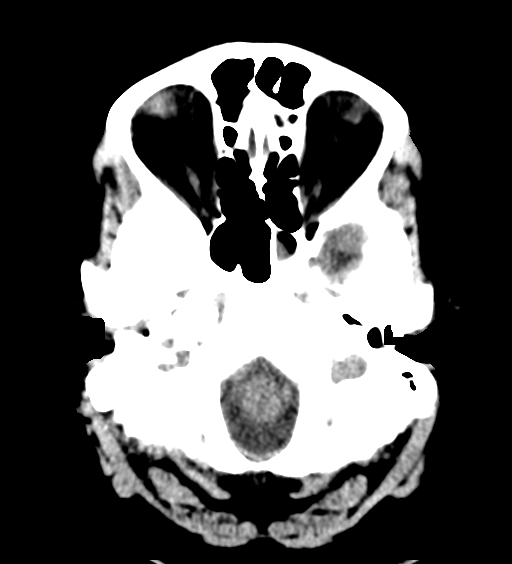
[im 3/28  bone]
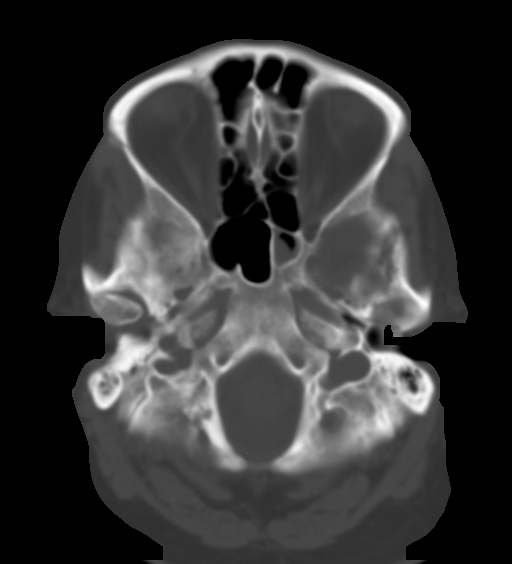
[im 6/28  brain]
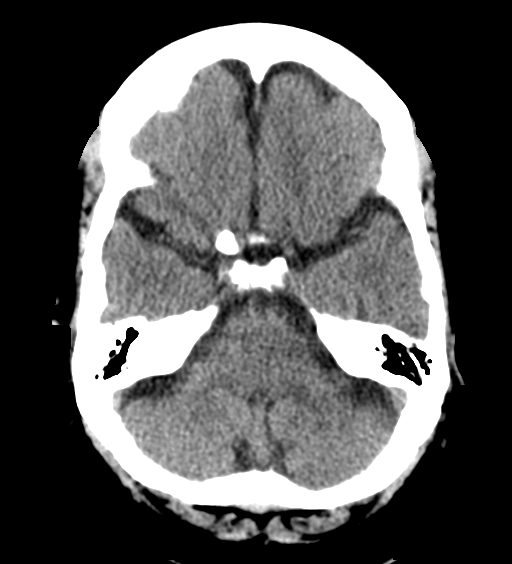
[im 10/28  brain]
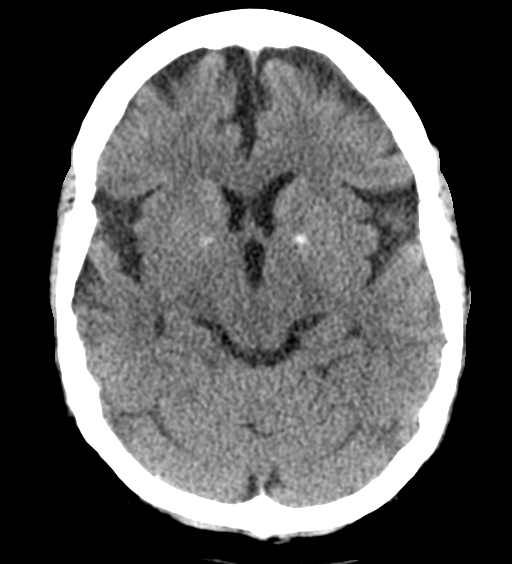
[im 13/28  brain]
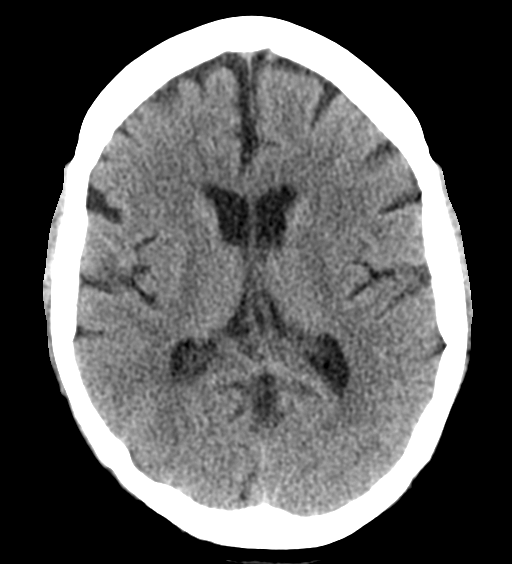
[im 16/28  brain]
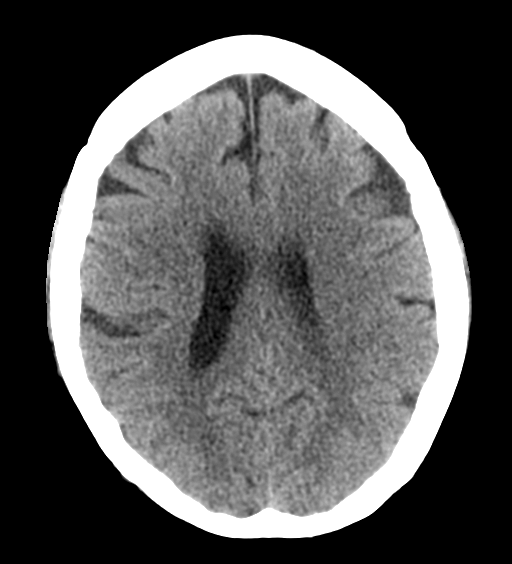
[im 16/28  bone]
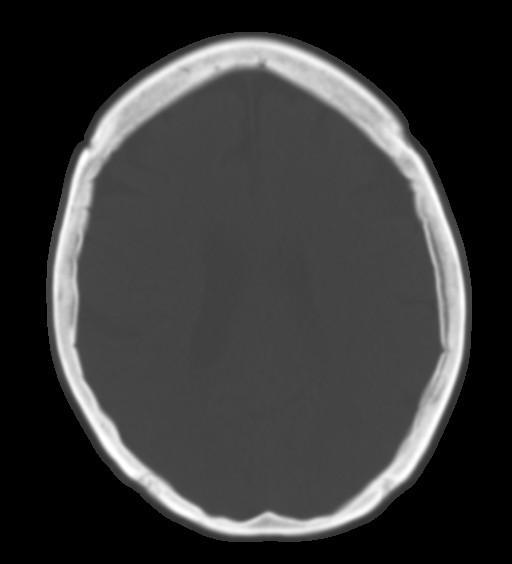
[im 19/28  brain]
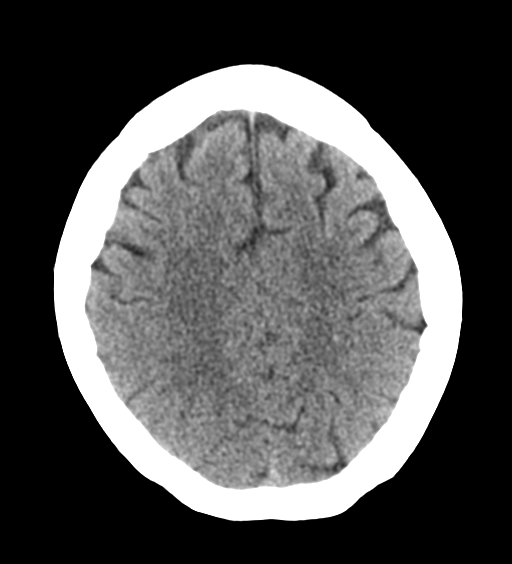
[im 23/28  brain]
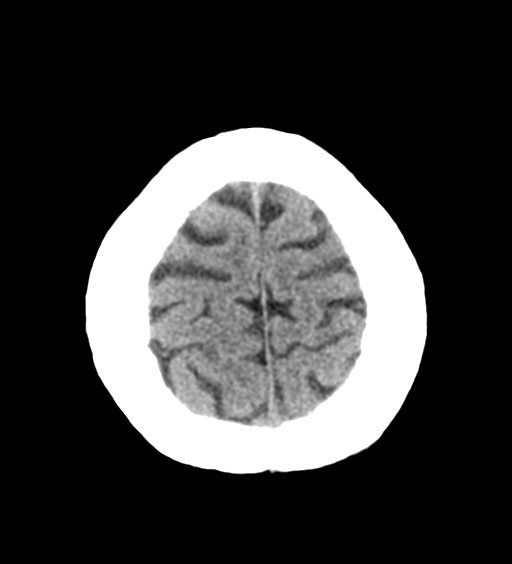
[im 26/28  brain]
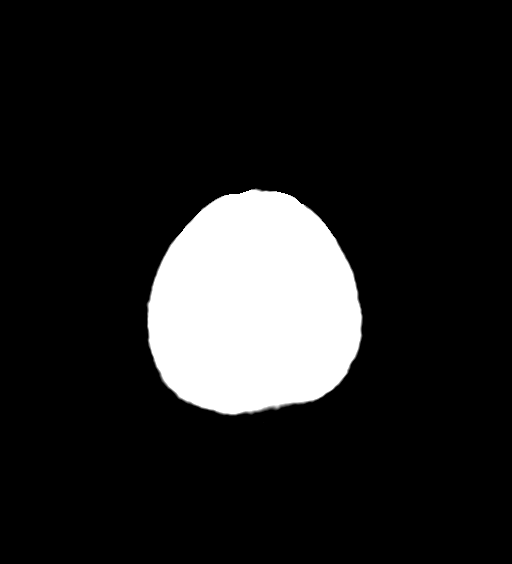

[Series 4: coronal soft tissue · coronal · 0.29mm/px · 3 of 57 slices shown]
[im 19/57  brain]
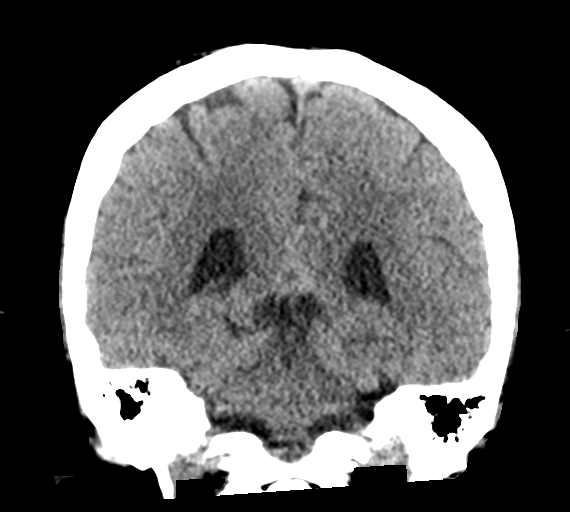
[im 25/57  brain]
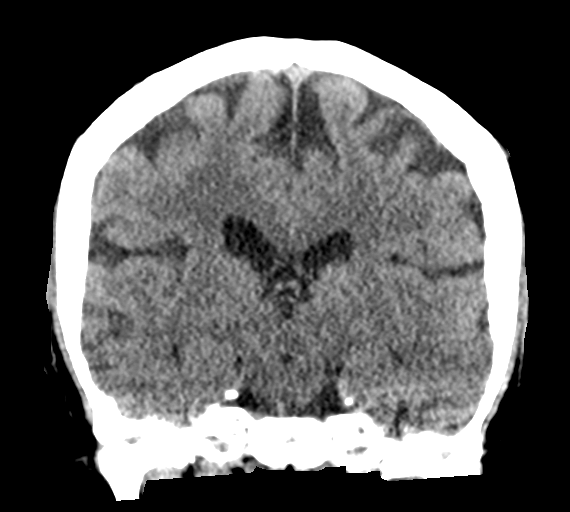
[im 32/57  brain]
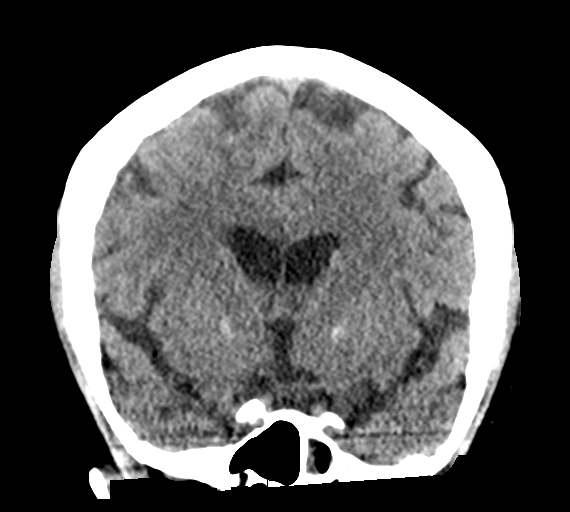

[Series 5: sagittal soft tissue · sagittal · 0.28mm/px · 3 of 48 slices shown]
[im 16/48  brain]
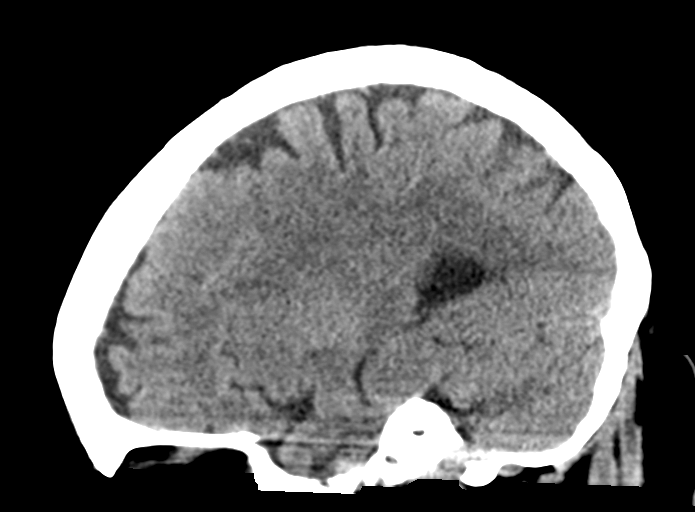
[im 24/48  brain]
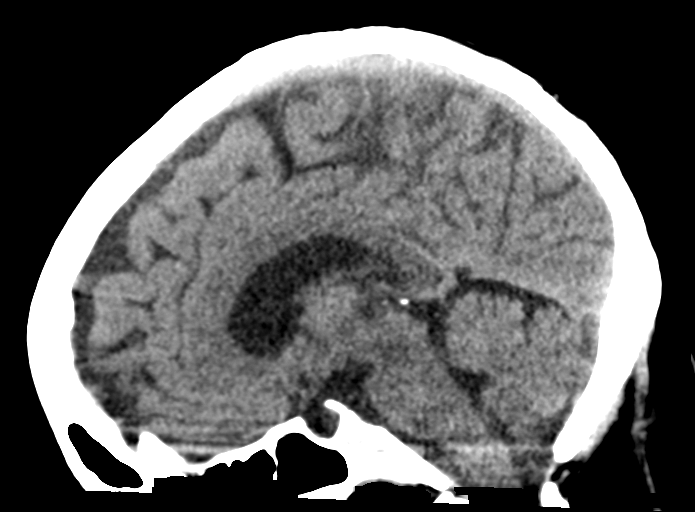
[im 32/48  brain]
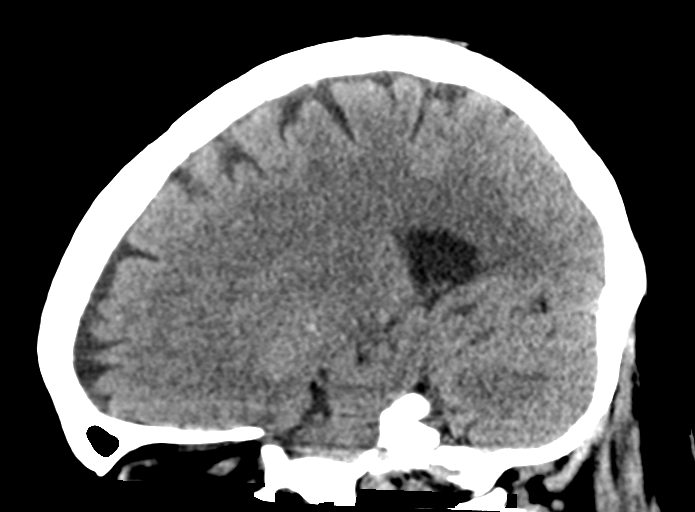

[14 of 45 positions shown; findings below may reference images not displayed]

FINDINGS: Brain: No evidence of acute infarction, hemorrhage, hydrocephalus,
extra-axial collection or mass lesion/mass effect. Signs of atrophy
and chronic microvascular ischemic change as before.

Vascular: No hyperdense vessel or unexpected calcification.

Skull: Normal. Negative for fracture or focal lesion.

Sinuses/Orbits: Mild sphenoid sinus disease on the LEFT and
scattered ethmoid opacification. Sinuses are incompletely imaged.
Orbits, incompletely imaged without acute finding within visualized
portions.

Other: None.
IMPRESSION: 1. No acute intracranial abnormality.
2. Signs of atrophy and chronic microvascular ischemic change as
before.
3. Mild sphenoid sinus disease and scattered ethmoid opacification.
Sinuses are incompletely imaged.

## 2021-12-05 ENCOUNTER — Telehealth: Payer: Self-pay

## 2021-12-05 NOTE — Telephone Encounter (Signed)
Patient left a voicemail and states she needs to talk to a Nurse about a medication. She did not say the medication.  ?

## 2021-12-07 ENCOUNTER — Other Ambulatory Visit: Payer: Self-pay | Admitting: Family Medicine

## 2021-12-07 DIAGNOSIS — J3489 Other specified disorders of nose and nasal sinuses: Secondary | ICD-10-CM

## 2021-12-07 NOTE — Telephone Encounter (Signed)
Left message on voicemail.

## 2021-12-07 NOTE — Telephone Encounter (Signed)
Patient left vm requesting to talk to Dr Dorothey Baseman nurse about some medication issues. Requesting call back. ?

## 2021-12-08 ENCOUNTER — Other Ambulatory Visit: Payer: Self-pay

## 2021-12-09 MED ORDER — LISINOPRIL 2.5 MG PO TABS
2.5000 mg | ORAL_TABLET | Freq: Every day | ORAL | 0 refills | Status: DC
Start: 2021-12-09 — End: 2022-04-04

## 2021-12-12 ENCOUNTER — Ambulatory Visit: Payer: Medicare Other | Admitting: Urology

## 2021-12-19 ENCOUNTER — Ambulatory Visit: Payer: Medicare Other | Admitting: Cardiovascular Disease

## 2021-12-20 IMAGING — CT CT HEAD W/O CM
4 series · 17 of 47 positions shown, 19 images · non-contrast
Comparison: 04/28/2020

CLINICAL DATA: Mental status changes

EXAM:
CT HEAD WITHOUT CONTRAST
TECHNIQUE: Contiguous axial images were obtained from the base of the skull
through the vertex without intravenous contrast.

[Series 3: head without · axial · non-contrast · 0.43mm/px · z∈[-110,+5]mm · 7 of 31 slices shown, 9 images]
[im 4/31  brain]
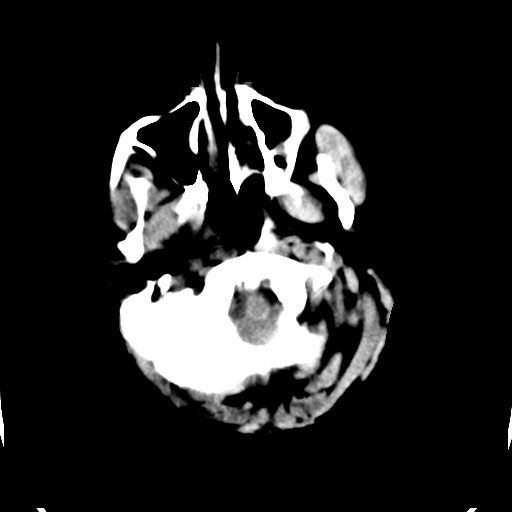
[im 4/31  bone]
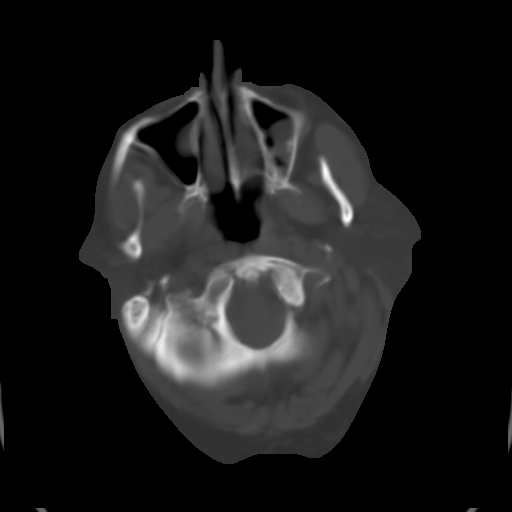
[im 8/31  brain]
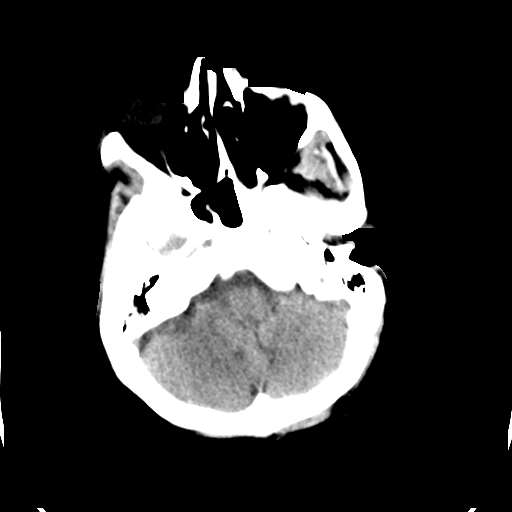
[im 12/31  brain]
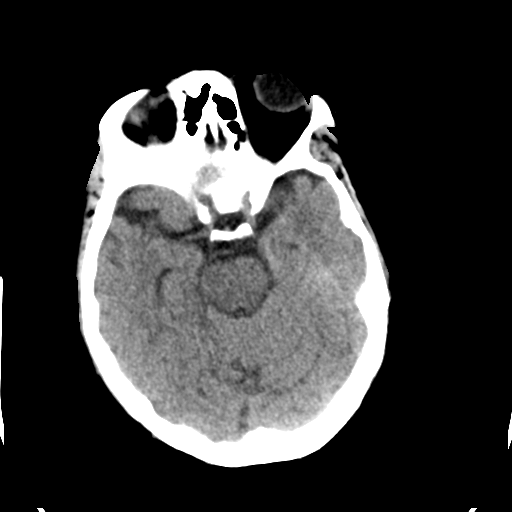
[im 16/31  brain]
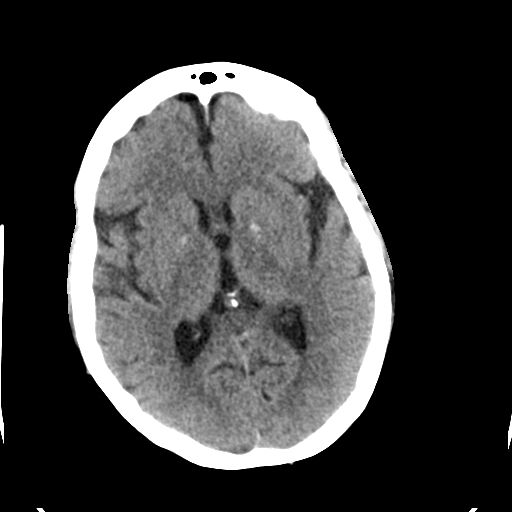
[im 19/31  brain]
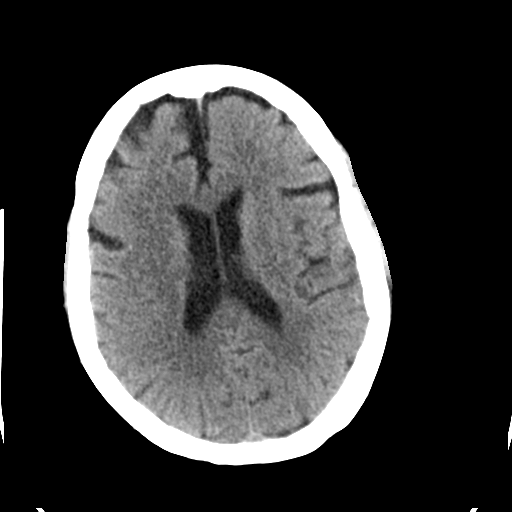
[im 19/31  bone]
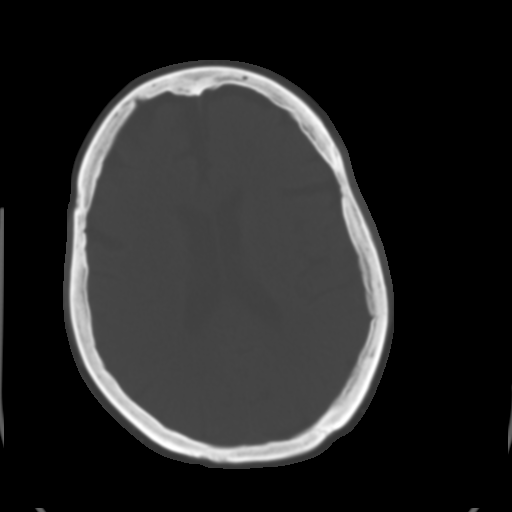
[im 23/31  brain]
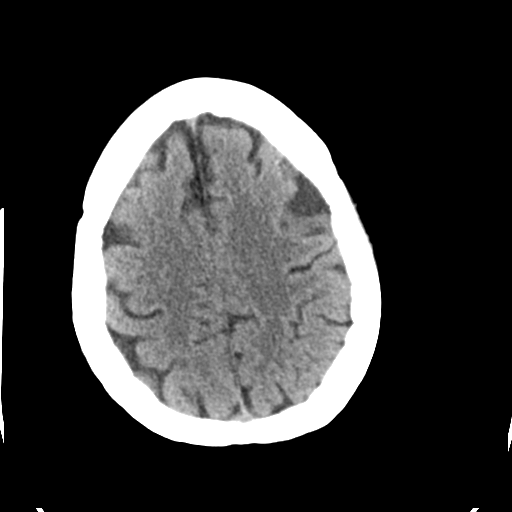
[im 27/31  brain]
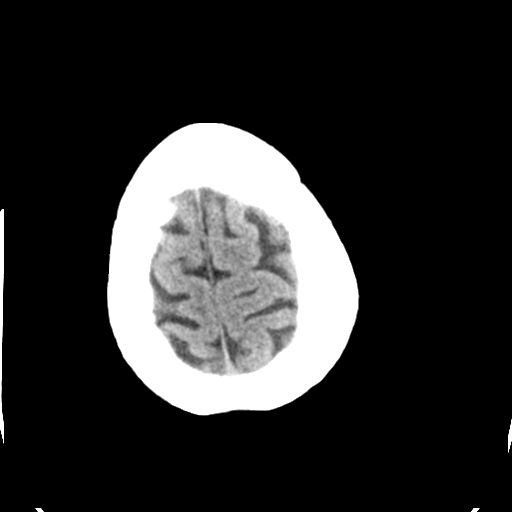

[Series 4: head bone · axial · 0.43mm/px · z∈[-111,-57]mm · 4 of 78 slices shown]
[im 8/78  bone]
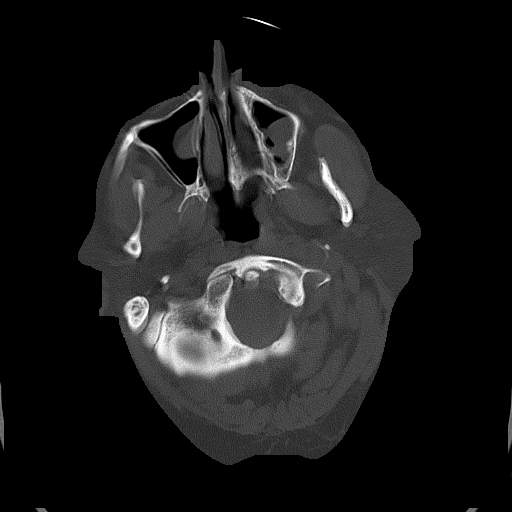
[im 16/78  bone]
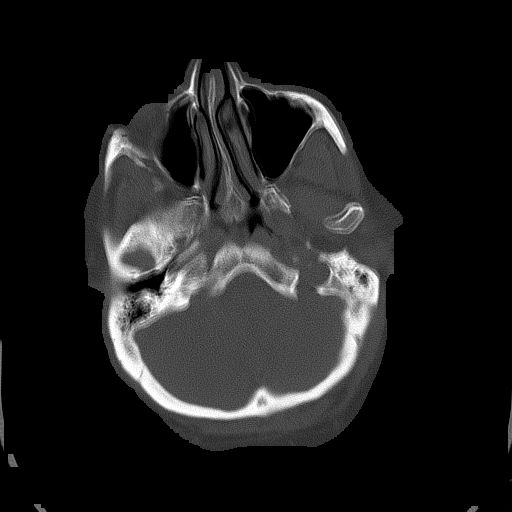
[im 24/78  bone]
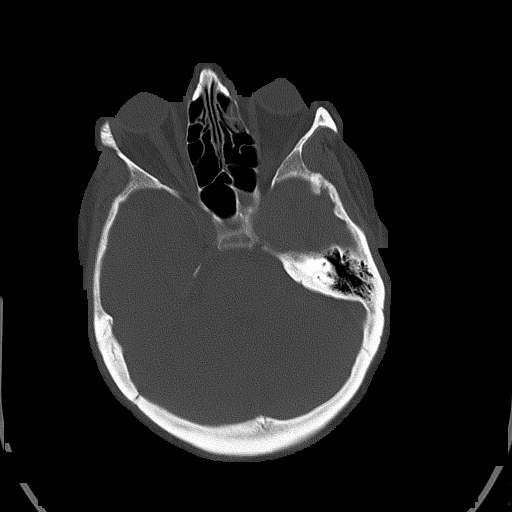
[im 35/78  bone]
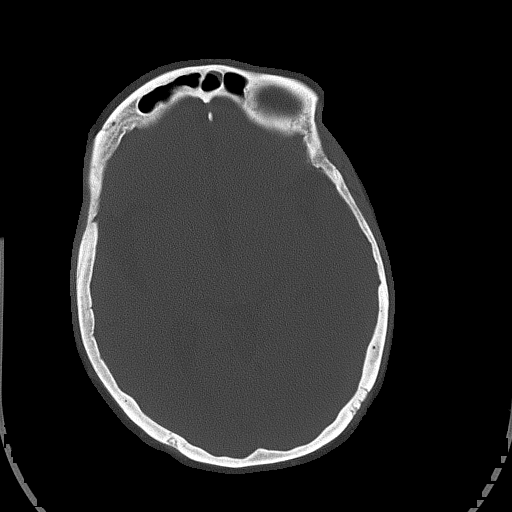

[Series 5: head without cor · coronal · non-contrast · 0.30mm/px · 3 of 67 slices shown]
[im 23/67  brain]
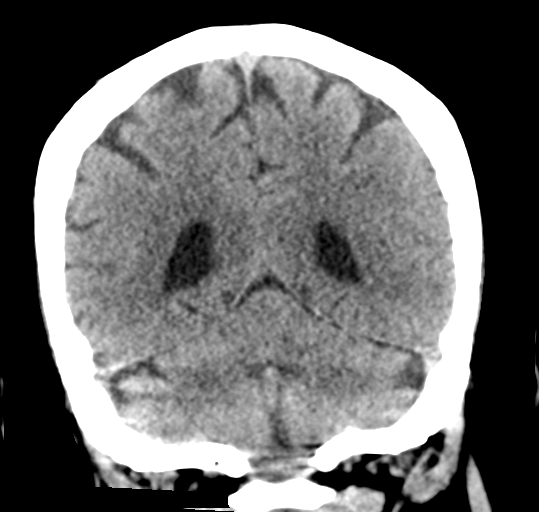
[im 30/67  brain]
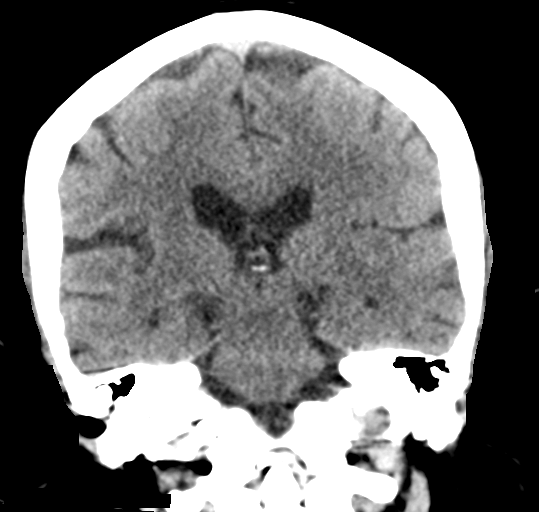
[im 37/67  brain]
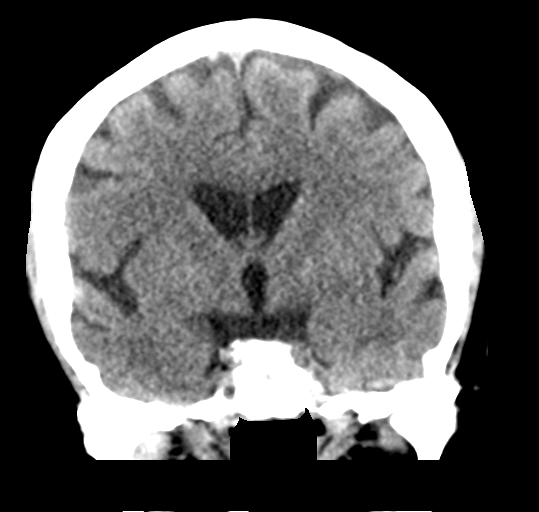

[Series 6: head without sag · sagittal · non-contrast · 0.30mm/px · 3 of 55 slices shown]
[im 19/55  brain]
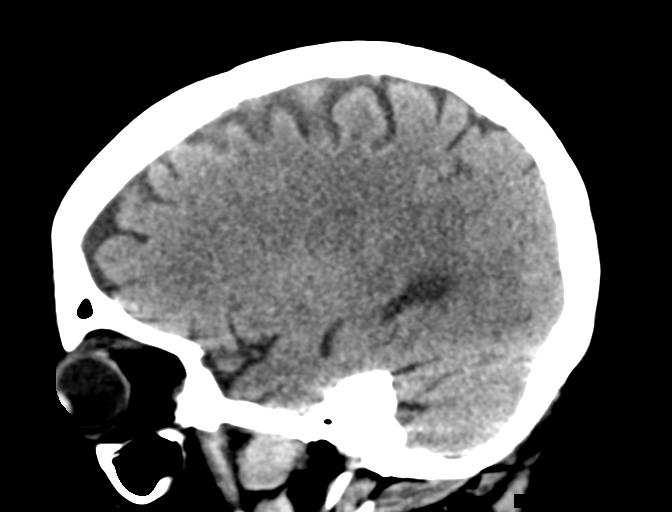
[im 28/55  brain]
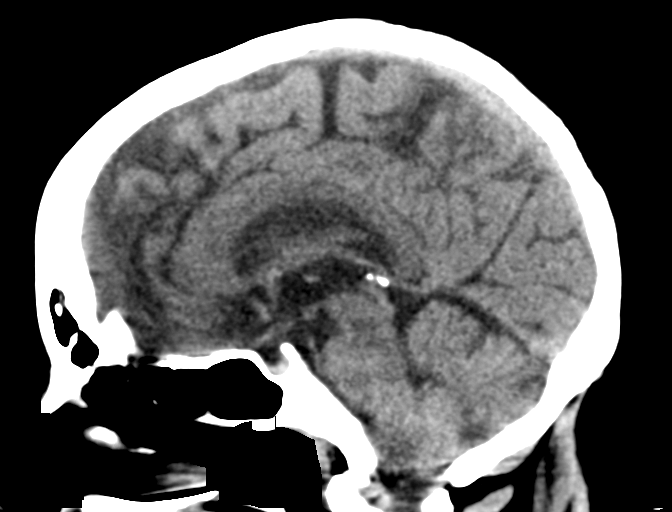
[im 37/55  brain]
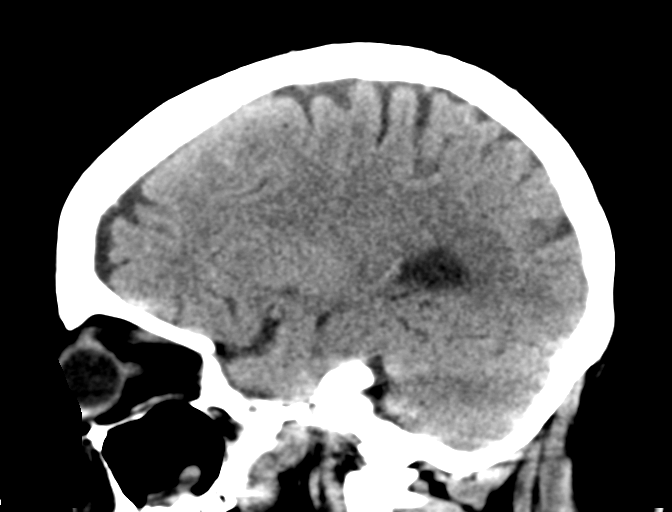

[17 of 47 positions shown; findings below may reference images not displayed]

FINDINGS: Brain: No acute intracranial abnormality. Specifically, no
hemorrhage, hydrocephalus, mass lesion, acute infarction, or
significant intracranial injury.

Vascular: No hyperdense vessel or unexpected calcification.

Skull: No acute calvarial abnormality.

Sinuses/Orbits: No acute findings

Other: None
IMPRESSION: No acute intracranial abnormality.

## 2021-12-26 ENCOUNTER — Other Ambulatory Visit: Payer: Self-pay | Admitting: Family Medicine

## 2021-12-26 DIAGNOSIS — G43009 Migraine without aura, not intractable, without status migrainosus: Secondary | ICD-10-CM

## 2021-12-27 NOTE — Progress Notes (Signed)
Name: [name]   MRN: [id number]    DOB: [birth year]   Date:12/28/2021 ? ?     Progress Note ? ?Subjective ? ?Chief Complaint ? ?Acute visit/Y[place] ? ?HPI ? ?RLS: she states she has been on Gabapentin  for a long time. She has an urge to move it at night, both feet feels on fire , sometimes also jumps during the day but mostly at night.  It causes difficulty for her to fall asleep. She has not been taking magnesium lately and advised her to resume it  ? ?Intertrigo : under breast and groin, worse spot in on left abdominal fold, itchy, redness no oozing, using topical neosporin but is not helping. She states heat makes it worse.  ?  ?Chronic bronchitis/hypoxemia/OSA  she states positive for OSA however unable to find a mask that fits her face and is only on nocturnal oxygen 2 liters. . She also has chronic bronchitis ( second hand smoker) denies daily cough and states no longer having SOB . Not currently on medication  ?  ?GERD: seeing Dr. Allen Norris , she takes PPI and prn promethazine  ?  ?DMII: seeing Dr. Ronnald Collum, A1C down from 10.6 % in January 2022 and January 2023 it was 7.7 % but during hospital stay it was up to 8.3 %. She states glucose has been higher than usual since brother died 12-03-22 th - on the day of his birthday due to hemorrhagic stroke. She has episodes of  polyphagia, polydipsia or polyuria. She is complaint with her medications. She is on multiple doses of insulin, Trulicity  and oral medication, we placed referral to endo but she states now that glucose is good she wants to stay with Dr. Ammie Dalton . She has obesity , dyslipidemia , CKI and neuropathy. She was reminded to have yearly eye exam.  She is on ACE, statin therapy and gabapentin for neuropathy.  ?  ?Hypothyroidism: managed by Dr. Ronnald Collum, she denies dysphagia, no dry skin, no change in bowel movements . Last TSH was low but she states Dr. Ronnald Collum told her to continue same dose, unchanged   ?  ?Schizoaffective disorder/Bipolar : current  under the care of [facility]  , she is compliant with medications and visits. She states mood has been stable, denies any hypomania or mania,  she has been sad lately, grieving the loss of her brother - died Dec 02, 2021  ? ?CAD/angina : echo normal EF, she is on medical management, seeing Dr. Rockey Situ. Denies chest pain , she is on medical management and symptoms of angina controlled . Unchanged  ?  ?Vitamin B12 and Vtiamin D deficiency: advised her to resume supplementation. ?  ?Morbid obesity: BMI above 40 with , she states appetite is good again but weight is down 4 lbs in the last 6 months  ?  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  ?CKI: she used to see  Nephrologist - Dr. Lanora Manis , she has GFR monitored by Dr. Ronnald Collum and GFR usually around 40's she is on ARB . Denies generalized pruritis  ?  ?Atherosclerosis of aorta: continue medications, on statin therapy, fenofibrate  and zetia, LDL has been at goal, continue medications  ?  ?Migraine headaches: episodes varies depending on stress, currently having episodes twice weekly. . She states Imitrex works well for her , discussed adding preventive medication. We will try Ubrelvy. She describes headaches as aching or  tightness, has photophobia and phonophobia  She states occasionally associated with nausea  ?  ?Patient Active Problem List  ? Diagnosis Date Noted  ? TIA (transient ischemic attack) 10/04/2021  ? HTN (hypertension) 10/04/2021  ? Chronic kidney disease, stage 3a ([place]) 10/04/2021  ? Depression with anxiety 10/04/2021  ? COPD (chronic obstructive pulmonary disease) ([place]) 10/04/2021  ? CAD (coronary artery disease) 10/04/2021  ? Chronic diastolic CHF (congestive heart failure) ([place]) 10/04/2021  ? Type II diabetes mellitus with renal manifestations ([place]) 06/30/2021  ? Interstitial cystitis 06/30/2021  ? Atherosclerosis of aorta ([place]) 03/28/2021  ? Iron deficiency anemia due to chronic  blood loss   ? Polyp of ascending colon   ? Vitamin B12 deficiency 08/08/2018  ? Patient is full code 10/30/2017  ? Coronary artery disease of native artery of native heart with stable angina pectoris ([place]) 10/09/2017  ? Plantar fascial fibromatosis 10/09/2017  ? Tendinitis of wrist 10/09/2017  ? Schizoaffective disorder, bipolar type ([place]) 09/28/2017  ? Adenomatous colon polyp 10/15/2015  ? Bipolar affective disorder ([place]) 10/15/2015  ? CN (constipation) 10/15/2015  ? HLD (hyperlipidemia) 10/15/2015  ? NASH (nonalcoholic steatohepatitis) 10/15/2015  ? Obesity (BMI 35.0-39.9 without comorbidity) 10/15/2015  ? Restless leg syndrome 10/15/2015  ? Low back pain with left-sided sciatica 08/18/2015  ? Apnea, sleep 05/05/2015  ? Heart valve disease 05/05/2015  ? Chronic kidney disease, stage III (moderate) ([place]) 03/15/2015  ? Migraine 03/12/2015  ? Chronic bronchitis ([place]) 01/20/2015  ? 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[place]) 01/20/2015  ? GERD (gastroesophageal reflux disease) 01/20/2015  ? Hypothyroid   ? Vitamin D deficiency   ? Urinary incontinence   ? Liver lesion   ? Hypomagnesemia   ? Obstructive sleep apnea 10/20/2013  ? Allergic rhinitis 07/04/2007  ? ? ?Past Surgical History:  ?Procedure Laterality Date  ? ABDOMINAL HYSTERECTOMY    ? Partial  ? bladder stim  2007  ? BLADDER SURGERY    ? x 2. tacking of bladder and the other was the stimulator placement  ? CHOLECYSTECTOMY    ? COLONOSCOPY WITH PROPOFOL N/A 03/04/2019  ? Procedure: COLONOSCOPY WITH PROPOFOL;  Surgeon: [name], MD;  Location: [facility] ENDOSCOPY;  Service: Endoscopy;  Laterality: N/A;  ? ESOPHAGOGASTRODUODENOSCOPY (EGD) WITH PROPOFOL N/A 03/04/2019  ? Procedure: ESOPHAGOGASTRODUODENOSCOPY (EGD) WITH PROPOFOL;  Surgeon: [name], MD;  Location[facility] END[facility]COPY;  Service: Endoscopy;  Laterality: N/A;  ? INCISION AND DRAINAGE ABSCESS Right 04/16/2019  ? Procedure: INCISION AND DRAINAGE ABSCESS;  Surgeon: Benjamine Sprague, DO;  Location: [facility] ORS;  Service:  General;  Laterality: Right;  ? INTERSTIM-GENERATOR CHANGE N/A 12/31/2017  ? Procedure: INTERSTIM-GENERATOR CHANGE;  Surgeon: Bjorn Loser, MD;  Location: [facility] ORS;  Service: Urology;  Laterality: N/A;  ? LIVER BIOPSY  March 2016  ? Negative  ? LUNG BIOPSY  2015  ? small spot on lung  ? TUBAL LIGATION    ? UPPER GI ENDOSCOPY    ? WRIST SURGERY Right 11/02/2019  ? ? ?Family History  ?Problem Relation Age of Onset  ? Diabetes Mother   ? Heart disease Mother   ? Hyperlipidemia Mother   ? Hypertension Mother   ? Kidney disease Mother   ? Mental illness Mother   ? Hypothyroidism Mother   ? Stroke Mother   ? Osteoporosis Mother   ? Glaucoma Mother   ? Congestive Heart Failure Mother   ? Hypertension Father   ? Kidney disease Brother   ? Intracerebral hemorrhage Brother   ? Breast cancer  Maternal Grandmother 60  ? Heart disease Maternal Grandfather   ? Rheum arthritis Maternal Grandfather   ? Cancer Maternal Grandfather   ?     liver  ? Kidney disease Maternal Grandfather   ? Cancer Paternal Grandmother   ?     lung  ? Asthma Daughter   ? Cancer Daughter   ?     throat  ? Bipolar disorder Daughter   ? Bipolar disorder Daughter   ? Hypertension Daughter   ? Restless legs syndrome Daughter   ? ? ?Social History  ? ?Tobacco Use  ? Smoking status: Never  ? Smokeless tobacco: Never  ?Substance Use Topics  ? Alcohol use: No  ? ? ? ?Current Outpatient Medications:  ?  atorvastatin (LIPITOR) 80 MG tablet, Take 80 mg by mouth at bedtime. , Disp: , Rfl:  ?  busPIRone (BUSPAR) 30 MG tablet, Take 30 mg by mouth 2 (two) times daily., Disp: , Rfl:  ?  cetirizine (ZYRTEC) 10 MG tablet, Take 1 tablet (10 mg total) by mouth daily., Disp: 30 tablet, Rfl: 0 ?  citalopram (CELEXA) 20 MG tablet, Take 20 mg by mouth daily., Disp: , Rfl:  ?  clopidogrel (PLAVIX) 75 MG tablet, Take 1 tablet (75 mg total) by mouth daily., Disp: 30 tablet, Rfl: 11 ?  esomeprazole (NEXIUM) 40 MG capsule, Take 1 capsule (40 mg total) by mouth in the morning. MUST  SCHEDULE OFFICE VISIT, Disp: 30 capsule, Rfl: 1 ?  ezetimibe (ZETIA) 10 MG tablet, Take 10 mg by mouth at bedtime., Disp: , Rfl:  ?  fenofibrate micronized (LOFIBRA) 134 MG capsule, Take 134 mg by mouth at

## 2021-12-28 ENCOUNTER — Ambulatory Visit (INDEPENDENT_AMBULATORY_CARE_PROVIDER_SITE_OTHER): Payer: Medicare Other | Admitting: Family Medicine

## 2021-12-28 ENCOUNTER — Encounter: Payer: Self-pay | Admitting: Family Medicine

## 2021-12-28 VITALS — BP 126/80 | HR 83 | Temp 98.3°F | Resp 16 | Ht 59.0 in | Wt 194.8 lb

## 2021-12-28 DIAGNOSIS — F250 Schizoaffective disorder, bipolar type: Secondary | ICD-10-CM

## 2021-12-28 DIAGNOSIS — I25118 Atherosclerotic heart disease of native coronary artery with other forms of angina pectoris: Secondary | ICD-10-CM | POA: Diagnosis not present

## 2021-12-28 DIAGNOSIS — Z23 Encounter for immunization: Secondary | ICD-10-CM | POA: Diagnosis not present

## 2021-12-28 DIAGNOSIS — G43009 Migraine without aura, not intractable, without status migrainosus: Secondary | ICD-10-CM

## 2021-12-28 DIAGNOSIS — L304 Erythema intertrigo: Secondary | ICD-10-CM

## 2021-12-28 DIAGNOSIS — J410 Simple chronic bronchitis: Secondary | ICD-10-CM | POA: Diagnosis not present

## 2021-12-28 DIAGNOSIS — E538 Deficiency of other specified B group vitamins: Secondary | ICD-10-CM

## 2021-12-28 DIAGNOSIS — N390 Urinary tract infection, site not specified: Secondary | ICD-10-CM

## 2021-12-28 DIAGNOSIS — G2581 Restless legs syndrome: Secondary | ICD-10-CM

## 2021-12-28 DIAGNOSIS — E1165 Type 2 diabetes mellitus with hyperglycemia: Secondary | ICD-10-CM

## 2021-12-28 DIAGNOSIS — E039 Hypothyroidism, unspecified: Secondary | ICD-10-CM

## 2021-12-28 DIAGNOSIS — D803 Selective deficiency of immunoglobulin G [IgG] subclasses: Secondary | ICD-10-CM | POA: Diagnosis not present

## 2021-12-28 DIAGNOSIS — Z1231 Encounter for screening mammogram for malignant neoplasm of breast: Secondary | ICD-10-CM

## 2021-12-28 DIAGNOSIS — I700 Atherosclerosis of aorta: Secondary | ICD-10-CM

## 2021-12-28 DIAGNOSIS — G4733 Obstructive sleep apnea (adult) (pediatric): Secondary | ICD-10-CM

## 2021-12-28 DIAGNOSIS — E1140 Type 2 diabetes mellitus with diabetic neuropathy, unspecified: Secondary | ICD-10-CM

## 2021-12-28 LAB — POCT URINALYSIS DIPSTICK (MANUAL)
Nitrite, UA: NEGATIVE
Poct Blood: 250 — AB
Poct Glucose: 1000 mg/dL — AB
Poct Ketones: NEGATIVE
Poct Urobilinogen: 1 mg/dL — AB
Spec Grav, UA: 1.01 (ref 1.010–1.025)
pH, UA: 5 (ref 5.0–8.0)

## 2021-12-28 MED ORDER — KETOCONAZOLE 2 % EX CREA: 1.0000 "application " | TOPICAL_CREAM | Freq: Every day | CUTANEOUS | 1 refills | Status: DC

## 2021-12-28 MED ORDER — FLUCONAZOLE 150 MG PO TABS: 150.0000 mg | ORAL_TABLET | Freq: Every day | ORAL | 0 refills | Status: DC

## 2021-12-28 MED ORDER — UBRELVY 100 MG PO TABS: 1.0000 | ORAL_TABLET | ORAL | 5 refills | Status: DC

## 2021-12-30 ENCOUNTER — Ambulatory Visit: Payer: Medicare Other | Admitting: Family Medicine

## 2022-01-04 ENCOUNTER — Ambulatory Visit (INDEPENDENT_AMBULATORY_CARE_PROVIDER_SITE_OTHER): Payer: Medicare Other

## 2022-01-04 DIAGNOSIS — E1140 Type 2 diabetes mellitus with diabetic neuropathy, unspecified: Secondary | ICD-10-CM

## 2022-01-04 NOTE — Chronic Care Management (AMB) (Signed)
?Chronic Care Management  ? ?CCM RN Visit Note ? ?01/04/2022 ?Name: [name] MRN: [id number] DOB: Dec 16, 1965 ? ?Subjective: ?[name] is a 56 y.o. year old female who is a primary care patient of Steele Sizer, MD. The care management team was consulted for assistance with disease management and care coordination needs.   ? ?Engaged with patient by telephone for follow up visit in response to provider referral for case management and care coordination services.  ? ?Consent to Services:  ?The patient was given information about Chronic Care Management services, agreed to services, and gave verbal consent prior to initiation of services.  Please see initial visit note for detailed documentation.  ? ?Patient agreed to services and verbal consent obtained.  ? ?Assessment: Review of patient past medical history, allergies, medications, health status, including review of consultants reports, laboratory and other test data, was performed as part of comprehensive evaluation and provision of chronic care management services.  ? ?SDOH (Social Determinants of Health) assessments and interventions performed: No ? ?CCM Care Plan ? ?Allergies  ?Allergen Reactions  ? Mirtazapine Anaphylaxis  ?  REACTION: closes throat  ? Morphine And Related   ?  anaphylaxis  ? Sulfamethoxazole-Trimethoprim Rash  ?  REACTION: Whelps all over  ? Vicodin Hp [Hydrocodone-Acetaminophen]   ? Amoxicillin Er Nausea And Vomiting  ?  Pt doesn't remember problem with amoxicillin -  SE ?Has patient had a PCN reaction causing immediate rash, facial/tongue/throat swelling, SOB or lightheadedness with hypotension: No ?Has patient had a PCN reaction causing severe rash involving mucus membranes or skin necrosis: No ?Has patient had a PCN reaction that required hospitalization: No ?Has patient had a PCN reaction occurring within the last 10 years: Yes ?If all of the above answers are "NO", then may proceed with Cephalosporin use.  ? Codeine Itching  ?   REACTION: itch, rash  ? Hydrocodone-Acetaminophen Rash  ?  REACTION: rash  ? Keflex [Cephalexin] Rash  ?  Pt does not remember having a reaction or rash, no airway involvement  ? Prasterone Nausea And Vomiting  ? ? ?Outpatient Encounter Medications as of 01/04/2022  ?Medication Sig  ? atorvastatin (LIPITOR) 80 MG tablet Take 80 mg by mouth at bedtime.   ? busPIRone (BUSPAR) 30 MG tablet Take 30 mg by mouth 2 (two) times daily.  ? cetirizine (ZYRTEC) 10 MG tablet Take 1 tablet (10 mg total) by mouth daily.  ? citalopram (CELEXA) 20 MG tablet Take 20 mg by mouth daily.  ? clopidogrel (PLAVIX) 75 MG tablet Take 1 tablet (75 mg total) by mouth daily.  ? esomeprazole (NEXIUM) 40 MG capsule Take 1 capsule (40 mg total) by mouth in the morning. MUST SCHEDULE OFFICE VISIT  ? ezetimibe (ZETIA) 10 MG tablet Take 10 mg by mouth at bedtime.  ? fenofibrate micronized (LOFIBRA) 134 MG capsule Take 134 mg by mouth at bedtime.  ? fluconazole (DIFLUCAN) 150 MG tablet Take 1 tablet (150 mg total) by mouth daily.  ? fluticasone (FLONASE) 50 MCG/ACT nasal spray PLACE 2 SPRAYS INTO BOTH NOSTRILS DAILY  ? gabapentin (NEURONTIN) 300 MG capsule Take 300 mg by mouth 2 (two) times daily.  ? gabapentin (NEURONTIN) 600 MG tablet Take 600 mg by mouth at bedtime.  ? GLYXAMBI 25-5 MG TABS Take 1 tablet by mouth daily.  ? HUMULIN R U-500 KWIKPEN 500 UNIT/ML kwikpen Inject 70 Units into the skin 3 (three) times daily with meals.  ? Insulin Aspart FlexPen (NOVOLOG) 100 UNIT/ML Inject into  the skin.  ? Insulin Pen Needle (PEN NEEDLES) 31G X 8 MM MISC   ? ketoconazole (NIZORAL) 2 % cream Apply 1 application. topically daily.  ? lamoTRIgine (LAMICTAL) 200 MG tablet Take 200 mg by mouth 2 (two) times daily.   ? lisinopril (ZESTRIL) 2.5 MG tablet Take 1 tablet (2.5 mg total) by mouth daily.  ? metoprolol tartrate (LOPRESSOR) 25 MG tablet Take 1 tablet (25 mg total) by mouth 2 (two) times daily.  ? nitroGLYCERIN (NITROSTAT) 0.4 MG SL tablet Place 1 tablet  (0.4 mg total) under the tongue every 5 (five) minutes as needed for chest pain.  ? OneTouch Delica Lancets 64Q MISC by Does not apply route.  ? promethazine (PHENERGAN) 12.5 MG tablet TAKE 1 TABLET BY MOUTH EVERY 6 HOURS AS NEEDED FOR NAUSEA OR VOMITING  ? QUEtiapine (SEROQUEL) 200 MG tablet Take 200 mg by mouth at bedtime. Along with 359m dose for a total of 5050m ? QUEtiapine (SEROQUEL) 300 MG tablet Take 300 mg by mouth at bedtime. Along with 2002mose for a total of 500m56m SUMAtriptan (IMITREX) 100 MG tablet TAKE 1 TABLET BY MOUTH ONCE FOR 1 DOSE. MAY REPEAT IN 2 HOURS IF HEADACHE PERSISTS OR RECURS.  ? SYNTHROID 100 MCG tablet Take 100 mcg by mouth daily.  ? TRULICITY 1.5 MG/0IH/4.7QQN Inject 1.5 mg into the skin once a week. (Wednesdays)  ? Ubrogepant (UBRELVY) 100 MG TABS Take 1 tablet by mouth every other day.  ? ?No facility-administered encounter medications on file as of 01/04/2022.  ? ? ?Patient Active Problem List  ? Diagnosis Date Noted  ? TIA (transient ischemic attack) 10/04/2021  ? HTN (hypertension) 10/04/2021  ? Chronic kidney disease, stage 3a (HCC)[place]/31/2023  ? Depression with anxiety 10/04/2021  ? COPD (chronic obstructive pulmonary disease) (HCC)[place]/31/2023  ? CAD (coronary artery disease) 10/04/2021  ? Chronic diastolic CHF (congestive heart failure) (HCC)[place]/31/2023  ? Type II diabetes mellitus with renal manifestations (HCC)[place]/27/2022  ? Interstitial cystitis 06/30/2021  ? Atherosclerosis of aorta (HCC)[place]/25/2022  ? Iron deficiency anemia due to chronic blood loss   ? Polyp of ascending colon   ? Vitamin B12 deficiency 08/08/2018  ? Patient is full code 10/30/2017  ? Coronary artery disease of native artery of native heart with stable angina pectoris (HCC)[place]/01/2018  ? Plantar fascial fibromatosis 10/09/2017  ? Tendinitis of wrist 10/09/2017  ? Schizoaffective disorder, bipolar type (HCC)[place]/25/2019  ? Adenomatous colon polyp 10/15/2015  ? Bipolar affective disorder (HCC)[place]/06/2016   ? CN (constipation) 10/15/2015  ? HLD (hyperlipidemia) 10/15/2015  ? NASH (nonalcoholic steatohepatitis) 10/15/2015  ? Obesity (BMI 35.0-39.9 without comorbidity) 10/15/2015  ? Restless leg syndrome 10/15/2015  ? Low back pain with left-sided sciatica 08/18/2015  ? Apnea, sleep 05/05/2015  ? Heart valve disease 05/05/2015  ? Chronic kidney disease, stage III (moderate) (HCC)[place]/07/2015  ? Migraine 03/12/2015  ? Chronic bronchitis (HCC)[place]/18/2016  ? 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HCC)[place]/18/2016  ? GERD (gastroesophageal reflux disease) 01/20/2015  ? Hypothyroid   ? Vitamin D deficiency   ? Urinary incontinence   ? Liver lesion   ? Hypomagnesemia   ? Obstructive sleep apnea 10/20/2013  ? Allergic rhinitis 07/04/2007  ? ? ? ?Patient Care Plan: RN Care Management Plan of Care  ?  ? ?Problem Identified: HLD, COPD-Chronic Bronchitis, DM   ?  ? ?Long-Range Goal: Disease Progression Prevented or Minimized   ?Start Date: 01/04/2022  ?Expected End Date:  04/04/2022  ?Priority: High  ?Note:   ?Current Barriers:  ?Chronic Disease Management support and education needs related to HLD, COPD, and DMII  ? ?RNCM Clinical Goal(s):  ?Patient will demonstrate ongoing adherence to prescribed treatment plan for HLD, COPD, and DMII through collaboration with the provider, [place], and the care team. ? ?Diabetes Interventions:   ? ?Lab Results  ?Component Value Date  ? HGBA1C 8.3 (H) 10/05/2021  ?  ?Lab Results  ?Component Value Date  ? HGBA1C 7.7 09/17/2021  ?Reviewed plan for diabetes management. Noted recent increase in A1C from 7.7 to 8.1% ?Reports taking medications and monitoring blood glucose readings as advised. Denies recent hypoglycemic or hyperglycemic episodes. ?Agreed to follow up call to discuss recent nutritional intake and dietary recommendations. ?Declined current need for additional diabetic resources. Will consider referral for dietician after next outreach. ? ?Patient Goals/Self-Care Activities: ?Take all  medications as prescribed ?Attend all scheduled provider appointments ?Call pharmacy for medication refills 3-7 days in advance of running out of medications ?Perform all self care activities independently  ?Ca

## 2022-01-05 ENCOUNTER — Encounter: Payer: Self-pay | Admitting: Family Medicine

## 2022-01-09 ENCOUNTER — Telehealth: Payer: Self-pay

## 2022-01-09 ENCOUNTER — Ambulatory Visit: Payer: Medicare Other | Admitting: Urology

## 2022-01-09 NOTE — Telephone Encounter (Signed)
Nibbe, [name] routed conversation to You 4 hours ago (12:57 PM)  ? ?[place]k, [name] 4 hours ago (12:57 PM)  ? ?AH ?Patient states that she needs some medical advice.   ?  ? ?

## 2022-01-09 NOTE — Telephone Encounter (Signed)
Patient states that she needs some medical advice.  ?

## 2022-01-10 ENCOUNTER — Other Ambulatory Visit: Payer: Self-pay | Admitting: Cardiovascular Disease

## 2022-01-10 DIAGNOSIS — R002 Palpitations: Secondary | ICD-10-CM

## 2022-01-10 MED ORDER — ESOMEPRAZOLE MAGNESIUM 40 MG PO CPDR: 40.0000 mg | DELAYED_RELEASE_CAPSULE | Freq: Every morning | ORAL | 0 refills | Status: DC

## 2022-01-10 NOTE — Addendum Note (Signed)
Addended by: Lurlean Nanny on: 01/10/2022 05:26 PM ? ? Modules accepted: Orders ? ?

## 2022-01-10 NOTE — Telephone Encounter (Signed)
Pt has been constipated and needed refill on Nexium... appt scheduled for f/u and constipation ?

## 2022-01-18 ENCOUNTER — Ambulatory Visit (INDEPENDENT_AMBULATORY_CARE_PROVIDER_SITE_OTHER): Payer: Medicare Other | Admitting: Gastroenterology

## 2022-01-18 ENCOUNTER — Encounter: Payer: Self-pay | Admitting: Gastroenterology

## 2022-01-18 VITALS — BP 129/82 | HR 79 | Temp 97.9°F | Wt 189.0 lb

## 2022-01-18 DIAGNOSIS — K5904 Chronic idiopathic constipation: Secondary | ICD-10-CM | POA: Diagnosis not present

## 2022-01-18 MED ORDER — PROMETHAZINE HCL 12.5 MG PO TABS: 12.5000 mg | ORAL_TABLET | Freq: Four times a day (QID) | ORAL | 1 refills | Status: DC | PRN

## 2022-01-18 NOTE — Progress Notes (Signed)
? ? ?Primary Care Physician: Steele Sizer, MD ? ?Primary Gastroenterologist:  Dr. [name] ? ?Chief Complaint  ?Patient presents with  ? Follow-up  ? Medication Refill  ? ? ?HPI: [name] is a 56 y.o. female here with a need for a refill of her medication and constipation.  The patient had an EGD and colonoscopy back in 2020 and had 2 adenomatous polyps removed at that time. ?She reports that she as nausea and constipation for the last for few weeks. She did not have these before the last few weeks. She has to manually disimpact herself. She was taking Linzess but stopped it. ?The patient has had chronic abdominal pain dating back to 2017 nausea in 2019.  The patient states that all of these symptoms came together.  She thinks that she may get better she starts moving her bowels on a regular basis. ? ? ?Past Medical History:  ?Diagnosis Date  ? Anxiety   ? Arthritis   ? Asthma   ? Bipolar depression ([place])   ? CHF (congestive heart failure) ([place])   ? Chronic headaches   ? migraines have lessened  ? Chronic kidney disease   ? ddstage 111  ? COPD (chronic obstructive pulmonary disease) ([place])   ? Coronary artery disease   ? Depression   ? Diabetic neuropathy ([place])   ? Dyspnea   ? uses inhalers for this  ? Dysrhythmia   ? rapid  ? Fatty liver   ? GERD (gastroesophageal reflux disease)   ? Headache   ? Heart attack [facility]) 2009  ? Hyperlipidemia   ? Hypertension   ? Hypomagnesemia   ? Hypothyroid   ? Irregular heart rhythm   ? Liver lesion   ? Favored to be benign on CT; MRI of abd w/contract in Aug 2016. Liver Biopsy-Negative, March 2016  ? Morbid obesity ([place])   ? OSA (obstructive sleep apnea)   ? Peripheral vascular disease ([place])   ? Pinched nerve   ? Back  ? Pneumonia   ? RLS (restless legs syndrome)   ? Seasonal allergies   ? Sleep apnea   ? uses oxygen at night 2L d/t cpap did not fit  ? Type 2 diabetes, uncontrolled, with neuropathy 10/15/2015  ? Overview:  Microalbumin 7.7 12/2010: Hgb A1c 10.2% 12/2010  and 9.7% 03/2011, eye exam 10/2009 [facility]   ? Urinary incontinence   ? Vitamin D deficiency   ? ? ?Current Outpatient Medications  ?Medication Sig Dispense Refill  ? atorvastatin (LIPITOR) 80 MG tablet Take 80 mg by mouth at bedtime.     ? busPIRone (BUSPAR) 30 MG tablet Take 30 mg by mouth 2 (two) times daily.    ? cetirizine (ZYRTEC) 10 MG tablet Take 1 tablet (10 mg total) by mouth daily. 30 tablet 0  ? citalopram (CELEXA) 20 MG tablet Take 20 mg by mouth daily.    ? clopidogrel (PLAVIX) 75 MG tablet Take 1 tablet (75 mg total) by mouth daily. 30 tablet 11  ? esomeprazole (NEXIUM) 40 MG capsule Take 1 capsule (40 mg total) by mouth in the morning. 30 capsule 0  ? ezetimibe (ZETIA) 10 MG tablet Take 10 mg by mouth at bedtime.    ? fenofibrate micronized (LOFIBRA) 134 MG capsule Take 134 mg by mouth at bedtime.    ? fluconazole (DIFLUCAN) 150 MG tablet Take 1 tablet (150 mg total) by mouth daily. 5 tablet 0  ? fluticasone (FLONASE) 50 MCG/ACT nasal spray PLACE  2 SPRAYS INTO BOTH NOSTRILS DAILY 16 g 0  ? gabapentin (NEURONTIN) 300 MG capsule Take 300 mg by mouth 2 (two) times daily.    ? gabapentin (NEURONTIN) 600 MG tablet Take 600 mg by mouth at bedtime.    ? GLYXAMBI 25-5 MG TABS Take 1 tablet by mouth daily.    ? HUMULIN R U-500 KWIKPEN 500 UNIT/ML kwikpen Inject 70 Units into the skin 3 (three) times daily with meals.    ? Insulin Aspart FlexPen (NOVOLOG) 100 UNIT/ML Inject into the skin.    ? Insulin Pen Needle (PEN NEEDLES) 31G X 8 MM MISC     ? ketoconazole (NIZORAL) 2 % cream Apply 1 application. topically daily. 60 g 1  ? lamoTRIgine (LAMICTAL) 200 MG tablet Take 200 mg by mouth 2 (two) times daily.     ? lisinopril (ZESTRIL) 2.5 MG tablet Take 1 tablet (2.5 mg total) by mouth daily. 90 tablet 0  ? metoprolol tartrate (LOPRESSOR) 25 MG tablet TAKE 1 TABLET BY MOUTH TWICE DAILY 180 tablet 3  ? nitroGLYCERIN (NITROSTAT) 0.4 MG SL tablet Place 1 tablet (0.4 mg total) under the tongue every 5 (five)  minutes as needed for chest pain. 25 tablet 0  ? OneTouch Delica Lancets 37D MISC by Does not apply route.    ? promethazine (PHENERGAN) 12.5 MG tablet TAKE 1 TABLET BY MOUTH EVERY 6 HOURS AS NEEDED FOR NAUSEA OR VOMITING 30 tablet 1  ? QUEtiapine (SEROQUEL) 200 MG tablet Take 200 mg by mouth at bedtime. Along with 384m dose for a total of 5081m   ? QUEtiapine (SEROQUEL) 300 MG tablet Take 300 mg by mouth at bedtime. Along with 20075mose for a total of 500m23m ? SUMAtriptan (IMITREX) 100 MG tablet TAKE 1 TABLET BY MOUTH ONCE FOR 1 DOSE. MAY REPEAT IN 2 HOURS IF HEADACHE PERSISTS OR RECURS. 10 tablet 0  ? SYNTHROID 100 MCG tablet Take 100 mcg by mouth daily.    ? TRULICITY 1.5 MG/0SK/8.7GON Inject 1.5 mg into the skin once a week. (Wednesdays)    ? Ubrogepant (UBRELVY) 100 MG TABS Take 1 tablet by mouth every other day. 16 tablet 5  ? ?No current facility-administered medications for this visit.  ? ? ?Allergies as of 01/18/2022 - Review Complete 12/28/2021  ?Allergen Reaction Noted  ? Mirtazapine Anaphylaxis   ? Morphine and related  10/20/2013  ? Sulfamethoxazole-trimethoprim Rash 06/09/2008  ? Vicodin hp [hydrocodone-acetaminophen]  11/04/2020  ? Amoxicillin er Nausea And Vomiting 12/24/2017  ? Codeine Itching   ? Hydrocodone-acetaminophen Rash   ? Keflex [cephalexin] Rash 07/04/2018  ? Prasterone Nausea And Vomiting 12/24/2017  ? ? ?ROS: ? ?General: Negative for anorexia, weight loss, fever, chills, fatigue, weakness. ?ENT: Negative for hoarseness, difficulty swallowing , nasal congestion. ?CV: Negative for chest pain, angina, palpitations, dyspnea on exertion, peripheral edema.  ?Respiratory: Negative for dyspnea at rest, dyspnea on exertion, cough, sputum, wheezing.  ?GI: See history of present illness. ?GU:  Negative for dysuria, hematuria, urinary incontinence, urinary frequency, nocturnal urination.  ?Endo: Negative for unusual weight change.  ?  ?Physical Examination: ? ? BP 129/82   Pulse 79   Temp  97.9 ?F (36.6 ?C) (Oral)   Wt 189 lb (85.7 kg)   BMI 38.17 kg/m?  ? ?General: Well-nourished, well-developed in no acute distress.  ?Eyes: No icterus. Conjunctivae pink. ?Neuro: Alert and oriented x 3.  Grossly intact. ?Skin: Warm and dry, no jaundice.   ?Psych: Alert and  cooperative, normal mood and affect. ? ?Labs:  ?  ?Imaging Studies: ?No results found. ? ?Assessment and Plan:  ? ?[name] is a 56 y.o. y/o female who comes in today with nausea and constipation with a history of being on Linzess.  The patient does not know the dose she was taking before but would like to start the [place] again.  The patient has been given samples of the 290 mcg and 145 mcg to see which one works for her.  The patient will contact us with whichever one works and will then be given a prescription for that dose.  The patient will also get a refill of her Phenergan for her nausea.  The patient has been explained the plan agrees with it. ? ? ? ? ?[name], MD. Marval Regal ? ? ? Note: This dictation was prepared with Dragon dictation along with smaller phrase technology. Any transcriptional errors that result from this process are unintentional.  ?

## 2022-01-19 ENCOUNTER — Other Ambulatory Visit: Payer: Self-pay

## 2022-01-19 ENCOUNTER — Ambulatory Visit: Payer: Self-pay

## 2022-01-19 ENCOUNTER — Emergency Department: Payer: Medicare Other

## 2022-01-19 ENCOUNTER — Emergency Department
Admission: EM | Admit: 2022-01-19 | Discharge: 2022-01-19 | Disposition: A | Discharge status: Home / Self Care | Payer: Medicare Other | Attending: Emergency Medicine | Admitting: Emergency Medicine

## 2022-01-19 DIAGNOSIS — N3000 Acute cystitis without hematuria: Secondary | ICD-10-CM | POA: Insufficient documentation

## 2022-01-19 DIAGNOSIS — E119 Type 2 diabetes mellitus without complications: Secondary | ICD-10-CM | POA: Insufficient documentation

## 2022-01-19 DIAGNOSIS — R519 Headache, unspecified: Secondary | ICD-10-CM | POA: Insufficient documentation

## 2022-01-19 DIAGNOSIS — R531 Weakness: Secondary | ICD-10-CM | POA: Diagnosis not present

## 2022-01-19 DIAGNOSIS — Z20822 Contact with and (suspected) exposure to covid-19: Secondary | ICD-10-CM | POA: Insufficient documentation

## 2022-01-19 DIAGNOSIS — R5383 Other fatigue: Secondary | ICD-10-CM | POA: Diagnosis not present

## 2022-01-19 DIAGNOSIS — R209 Unspecified disturbances of skin sensation: Secondary | ICD-10-CM | POA: Diagnosis not present

## 2022-01-19 DIAGNOSIS — R4781 Slurred speech: Secondary | ICD-10-CM | POA: Diagnosis not present

## 2022-01-19 DIAGNOSIS — R4701 Aphasia: Secondary | ICD-10-CM | POA: Insufficient documentation

## 2022-01-19 DIAGNOSIS — S0990XA Unspecified injury of head, initial encounter: Secondary | ICD-10-CM | POA: Diagnosis not present

## 2022-01-19 DIAGNOSIS — N179 Acute kidney failure, unspecified: Secondary | ICD-10-CM | POA: Diagnosis not present

## 2022-01-19 LAB — CBC WITH DIFFERENTIAL/PLATELET
Abs Immature Granulocytes: 0.06 10*3/uL (ref 0.00–0.07)
Basophils Absolute: 0 10*3/uL (ref 0.0–0.1)
Basophils Relative: 0 %
Eosinophils Absolute: 0.2 10*3/uL (ref 0.0–0.5)
Eosinophils Relative: 2 %
HCT: 40.1 % (ref 36.0–46.0)
Hemoglobin: 12.6 g/dL (ref 12.0–15.0)
Immature Granulocytes: 1 %
Lymphocytes Relative: 38 %
Lymphs Abs: 2.8 10*3/uL (ref 0.7–4.0)
MCH: 29.4 pg (ref 26.0–34.0)
MCHC: 31.4 g/dL (ref 30.0–36.0)
MCV: 93.5 fL (ref 80.0–100.0)
Monocytes Absolute: 0.7 10*3/uL (ref 0.1–1.0)
Monocytes Relative: 9 %
Neutro Abs: 3.7 10*3/uL (ref 1.7–7.7)
Neutrophils Relative %: 50 %
Platelets: 282 10*3/uL (ref 150–400)
RBC: 4.29 MIL/uL (ref 3.87–5.11)
RDW: 13.2 % (ref 11.5–15.5)
WBC: 7.4 10*3/uL (ref 4.0–10.5)
nRBC: 0 % (ref 0.0–0.2)

## 2022-01-19 LAB — COMPREHENSIVE METABOLIC PANEL
ALT: 43 U/L (ref 0–44)
AST: 46 U/L — ABNORMAL HIGH (ref 15–41)
Albumin: 3.9 g/dL (ref 3.5–5.0)
Alkaline Phosphatase: 42 U/L (ref 38–126)
Anion gap: 9 (ref 5–15)
BUN: 36 mg/dL — ABNORMAL HIGH (ref 6–20)
CO2: 24 mmol/L (ref 22–32)
Calcium: 9.7 mg/dL (ref 8.9–10.3)
Chloride: 103 mmol/L (ref 98–111)
Creatinine, Ser: 1.7 mg/dL — ABNORMAL HIGH (ref 0.44–1.00)
GFR, Estimated: 35 mL/min — ABNORMAL LOW (ref >60)
Glucose, Bld: 149 mg/dL — ABNORMAL HIGH (ref 70–99)
Potassium: 4 mmol/L (ref 3.5–5.1)
Sodium: 136 mmol/L (ref 135–145)
Total Bilirubin: 0.6 mg/dL (ref 0.3–1.2)
Total Protein: 6.7 g/dL (ref 6.5–8.1)

## 2022-01-19 LAB — RESP PANEL BY RT-PCR (FLU A&B, COVID) ARPGX2
Influenza A by PCR: NEGATIVE
Influenza B by PCR: NEGATIVE
SARS Coronavirus 2 by RT PCR: NEGATIVE

## 2022-01-19 LAB — URINALYSIS, ROUTINE W REFLEX MICROSCOPIC
Bilirubin Urine: NEGATIVE
Glucose, UA: >=500 mg/dL — AB
Hgb urine dipstick: NEGATIVE
Ketones, ur: NEGATIVE mg/dL
Nitrite: NEGATIVE
Protein, ur: NEGATIVE mg/dL
Specific Gravity, Urine: 1.031 — ABNORMAL HIGH (ref 1.005–1.030)
pH: 5 (ref 5.0–8.0)

## 2022-01-19 LAB — TROPONIN I (HIGH SENSITIVITY): Troponin I (High Sensitivity): 6 ng/L

## 2022-01-19 LAB — CK: Total CK: 90 U/L (ref 38–234)

## 2022-01-19 LAB — TSH: TSH: 0.034 u[IU]/mL — ABNORMAL LOW (ref 0.350–4.500)

## 2022-01-19 LAB — T4, FREE: Free T4: 1.02 ng/dL (ref 0.61–1.12)

## 2022-01-19 MED ORDER — SODIUM CHLORIDE 0.9 % IV BOLUS
500.0000 mL | Freq: Once | INTRAVENOUS | Status: AC
  Administered 2022-01-19: 500 mL via INTRAVENOUS

## 2022-01-19 MED ORDER — FOSFOMYCIN TROMETHAMINE 3 G PO PACK
3.0000 g | PACK | Freq: Once | ORAL | Status: AC
  Administered 2022-01-19: 3 g via ORAL
  Filled 2022-01-19: qty 3

## 2022-01-19 NOTE — Telephone Encounter (Signed)
Pt says she has been dizzy for two days, bumping into walls, low energy                               Left message to call back about symptoms.

## 2022-01-19 NOTE — ED Provider Notes (Signed)
[facility] Provider Note    Event Date/Time   First MD Initiated Contact with Patient 01/19/22 1726     (approximate)   History   Aphasia   HPI  [name] is a 56 y.o. female with diabetes, anxiety, asthma who comes in with concerns for aphasia.  Patient reports having some increasing fatigue over the past 3 days.  She reports 2 falls where she does not lose consciousness she reports just tripping and feeling a little weak.  She denies hitting her head.  She denies any chest pain, shortness of breath, abdominal pain.  According to her husband that he reported that he thought her speech was a little slurred but she reports to me that it is more of just difficulty finding her words.  She denies any weakness on one side.  She does report to other family members have been sick with viruses.  She does report a little mild headache as well but reports that she history of migraines and this is no different than her normal.  I reviewed records patient's records from 10/31/2021 where she came in with right-sided tingling sensation and patient had a code stroke.  She had prior TIA work-up inpatient that has been negative and she cannot get MRI due to a stimulator.  I reviewed where patient was seen in and admitted from 1/31 until 2/1 with TIA with resolution of symptoms.   Physical Exam   Triage Vital Signs: ED Triage Vitals  Enc Vitals Group     BP 01/19/22 1603 (!) 157/142     Pulse Rate 01/19/22 1603 86     Resp 01/19/22 1603 16     Temp 01/19/22 1603 98.2 F (36.8 C)     Temp Source 01/19/22 1603 Oral     SpO2 01/19/22 1603 100 %     Weight --      Height --      Head Circumference --      Peak Flow --      Pain Score 01/19/22 1607 0     Pain Loc --      Pain Edu? --      Excl. in [place]? --     Most recent vital signs: Vitals:   01/19/22 1603  BP: (!) 157/142  Pulse: 86  Resp: 16  Temp: 98.2 F (36.8 C)  SpO2: 100%     General: Awake, no  distress.  CV:  Good peripheral perfusion.  Resp:  Normal effort.  Abd:  No distention.  Soft nontender Other:  Cranial nerves II through XII are intact.  Equal strength in arms and legs.  Finger-to-nose intact bilaterally.  No slurred speech appreciated on my examination   ED Results / Procedures / Treatments   Labs (all labs ordered are listed, but only abnormal results are displayed) Labs Reviewed  COMPREHENSIVE METABOLIC PANEL - Abnormal; Notable for the following components:      Result Value   Glucose, Bld 149 (*)    BUN 36 (*)    Creatinine, Ser 1.70 (*)    AST 46 (*)    GFR, Estimated 35 (*)    All other components within normal limits  CBC WITH DIFFERENTIAL/PLATELET  URINALYSIS, ROUTINE W REFLEX MICROSCOPIC     EKG  My interpretation of EKG:  Sinus rate of 83 without any ST elevation or T wave inversions, normal intervals  RADIOLOGY I have reviewed the the CT personally and interpreted it without any evidence  of intercranial hemorrhage   PROCEDURES:  Critical Care performed: No  Procedures   MEDICATIONS ORDERED IN ED: Medications - No data to display   IMPRESSION / MDM / [place] / ED COURSE  I reviewed the triage vital signs and the nursing notes.  Patient comes in with some weakness, fall, slurred speech.  This concerning for acute life-threatening pathology and differential includes intracranial hemorrhage, stroke, electrolyte abnormalities, ACS.  She has no other extremity tenderness to suggest fractures  Patient CBC is reassuring with normal white count no anemia CMP does show slightly elevated creatinine given the falls we will add on CK to evaluate for rhabdo and give a little bit of fluid.  Patient reports that her speech now seems to be at her baseline self.  We discussed that she cannot get an MRI due to her bladder stimulator and she is had full work-up recently for TIA.  We discussed the utility of admission for TIA work-up again  versus neurology consultation but she states that she would really like to go home.  She initially declined any additional blood work or fluids but was then amenable after I discussed further with her.  7:12 PM Reevaluated patient she reports that her speech is still at baseline.  Patient's urine looks concerning for possible UTI will send for culture and start on antibiotyics that given she does report a history of getting septic from UTIs previously.  Patient does not appear septic at this time.  We will ambulate patient and if she ambulates well anticipate discharge home.  Patient has a lot of allergies and given her slight AKI we will do a dose of fosfomycin prior to discharge.  Patient expressed understand felt comfortable with this plan.  Patient handed off pending ambulation trial after fluids and antibiotic but suspect discharge home and follow-up with her PCP tomorrow     FINAL CLINICAL IMPRESSION(S) / ED DIAGNOSES   Final diagnoses:  Weakness  Acute cystitis without hematuria     Rx / DC Orders   ED Discharge Orders     None        Note:  This document was prepared using Dragon voice recognition software and may include unintentional dictation errors.   Vanessa Worthington, MD 01/19/22 [phone]

## 2022-01-19 NOTE — ED Triage Notes (Signed)
Pt presents to ED with c/o of slurred speech for the past 2 days. Pt states she was sent by MD. Pt reports she does take blood thinners and has had 2 falls yesterday, pt endorsed she did not hit her head or have any +LOC.

## 2022-01-19 NOTE — Telephone Encounter (Addendum)
Reason for Disposition  SEVERE dizziness (e.g., unable to stand, requires support to walk, feels like passing out now)  Answer Assessment - Initial Assessment Questions 1. DESCRIPTION: "Describe your dizziness."     I returned pt's call.  I've been in bed all day.  I'm so dizzy.   I'm bumping into walls.    They said I had a mini stroke but I'm not so sure of that.   The scans show nothing.     I fell last night and the other night.   I was so dizzy.    At this point I referred her to the ED.   She did not want to go.   I was able to talk her into going.   There's someone there with her that can take her to [facility].  2. LIGHTHEADED: "Do you feel lightheaded?" (e.g., somewhat faint, woozy, weak upon standing)     So dizzy she has been in bed all day.   Weak and very dizzy.  This is the same symptoms I had when they said I had a mini stroke. 3. VERTIGO: "Do you feel like either you or the room is spinning or tilting?" (i.e. vertigo)     Not asked 4. SEVERITY: "How bad is it?"  "Do you feel like you are going to faint?" "Can you stand and walk?"   - MILD: Feels slightly dizzy, but walking normally.   - MODERATE: Feels unsteady when walking, but not falling; interferes with normal activities (e.g., school, work).   - SEVERE: Unable to walk without falling, or requires assistance to walk without falling; feels like passing out now.      Severe 5. ONSET:  "When did the dizziness begin?"     2 days ago 6. AGGRAVATING FACTORS: "Does anything make it worse?" (e.g., standing, change in head position)     Walking around.   I bump into the walls. 7. HEART RATE: "Can you tell me your heart rate?" "How many beats in 15 seconds?"  (Note: not all patients can do this)       Not asked 8. CAUSE: "What do you think is causing the dizziness?"     I don't know.   They say I had a mini stroke but I don't know about that. 9. RECURRENT SYMPTOM: "Have you had dizziness before?" If Yes, ask:  "When was the last time?" "What happened that time?"     Yes symptom when she had a mini stroke. 10. OTHER SYMPTOMS: "Do you have any other symptoms?" (e.g., fever, chest pain, vomiting, diarrhea, bleeding)       Very weak 11. PREGNANCY: "Is there any chance you are pregnant?" "When was your last menstrual period?"       Not asked  Protocols used: Dizziness - Lightheadedness-A-AH  Chief Complaint: dizzy for 2 days, bumping into walls and very weak.   Has fallen twice with one time being last night.    Been in bed all day due to dizziness today. Symptoms: above Frequency: 2 days Pertinent Negatives: Patient denies hurting herself when she fell last night Disposition: [x] ED /[] Urgent Care (no appt availability in office) / [] Appointment(In office/virtual)/ []  [place] [facility]/ [] Home Care/ [] Refused Recommended Disposition /[] [place] [facility]/ []  Follow-up with PCP Additional Notes: Pt was agreeable to going to the ED after some coaxing.      [employer] and spoke with Melissa.  I let her know I had referred this pt  to the ED and that my notes were sent over too for Dr. Ancil Boozer awareness.

## 2022-01-19 NOTE — Discharge Instructions (Addendum)
Your CT imaging was reassuring and you are not able to get an MRI and you report your symptoms were getting better therefore you can follow-up with your primary care doctor tomorrow and return if you develop return of symptoms or any other concerns  Your kidney function was slightly elevated so increase your p.o. hydration and have this rechecked we have given you an antibiotic to help with UTI.

## 2022-01-20 DIAGNOSIS — E039 Hypothyroidism, unspecified: Secondary | ICD-10-CM | POA: Diagnosis not present

## 2022-01-20 DIAGNOSIS — E7800 Pure hypercholesterolemia, unspecified: Secondary | ICD-10-CM | POA: Diagnosis not present

## 2022-01-20 DIAGNOSIS — E1165 Type 2 diabetes mellitus with hyperglycemia: Secondary | ICD-10-CM | POA: Diagnosis not present

## 2022-01-20 DIAGNOSIS — I10 Essential (primary) hypertension: Secondary | ICD-10-CM | POA: Diagnosis not present

## 2022-01-20 NOTE — Telephone Encounter (Signed)
Per Dr Ancil Boozer: reached out to pt to schedule an appointment for one day next week. It can be with Almyra Free or Dr Rosana Berger if Dr Ancil Boozer is booked.

## 2022-01-21 LAB — URINE CULTURE

## 2022-01-27 DIAGNOSIS — F250 Schizoaffective disorder, bipolar type: Secondary | ICD-10-CM | POA: Diagnosis not present

## 2022-01-31 ENCOUNTER — Other Ambulatory Visit: Payer: Self-pay

## 2022-01-31 MED ORDER — LINACLOTIDE 145 MCG PO CAPS: 145.0000 ug | ORAL_CAPSULE | Freq: Every day | ORAL | 5 refills | Status: DC

## 2022-01-31 NOTE — Telephone Encounter (Signed)
Pt lmovm stating that she did well on Linzess 163mg and would like a Rx sent to pharmacy.... Rx sent through e-scribe

## 2022-02-01 DIAGNOSIS — E7800 Pure hypercholesterolemia, unspecified: Secondary | ICD-10-CM | POA: Diagnosis not present

## 2022-02-01 DIAGNOSIS — E1169 Type 2 diabetes mellitus with other specified complication: Secondary | ICD-10-CM

## 2022-02-01 DIAGNOSIS — D631 Anemia in chronic kidney disease: Secondary | ICD-10-CM | POA: Diagnosis not present

## 2022-02-01 DIAGNOSIS — E039 Hypothyroidism, unspecified: Secondary | ICD-10-CM | POA: Diagnosis not present

## 2022-02-01 DIAGNOSIS — Z794 Long term (current) use of insulin: Secondary | ICD-10-CM

## 2022-02-01 DIAGNOSIS — I10 Essential (primary) hypertension: Secondary | ICD-10-CM | POA: Diagnosis not present

## 2022-02-02 ENCOUNTER — Ambulatory Visit (INDEPENDENT_AMBULATORY_CARE_PROVIDER_SITE_OTHER): Payer: Medicare Other

## 2022-02-02 DIAGNOSIS — Z0000 Encounter for general adult medical examination without abnormal findings: Secondary | ICD-10-CM

## 2022-02-02 DIAGNOSIS — Z5941 Food insecurity: Secondary | ICD-10-CM

## 2022-02-02 DIAGNOSIS — Z599 Problem related to housing and economic circumstances, unspecified: Secondary | ICD-10-CM | POA: Diagnosis not present

## 2022-02-02 NOTE — Progress Notes (Signed)
Subjective:   [name] is a 56 y.o. female who presents for Medicare Annual (Subsequent) preventive examination.  Virtual Visit via Telephone Note  I connected with  [name] on 02/02/22 at  2:00 PM EDT by telephone and verified that I am speaking with the correct person using two identifiers.  Location: Patient: home Provider: [place] Persons participating in the virtual visit: [place]   I discussed the limitations, risks, security and privacy concerns of performing an evaluation and management service by telephone and the availability of in person appointments. The patient expressed understanding and agreed to proceed.  Interactive audio and video telecommunications were attempted between this nurse and patient, however failed, due to patient having technical difficulties OR patient did not have access to video capability.  We continued and completed visit with audio only.  Some vital signs may be absent or patient reported.   Stacey Marker, LPN   Review of Systems     Cardiac Risk Factors include: diabetes mellitus;dyslipidemia;hypertension;sedentary lifestyle;obesity (BMI >30kg/m2)     Objective:    Today's Vitals   There is no height or weight on file to calculate BMI.     02/02/2022    2:17 PM 10/31/2021   12:13 PM 02/01/2021    2:29 PM 01/25/2021   11:15 AM 11/29/2020    7:13 PM 11/09/2020    4:45 PM 09/16/2020    1:31 PM  Advanced Directives  Does Patient Have a Medical Advance Directive? No No No No No No No  Would patient like information on creating a medical advance directive? Yes (MAU/Ambulatory/Procedural Areas - Information given)  No - Patient declined  No - Patient declined  No - Patient declined    Current Medications (verified) Outpatient Encounter Medications as of 02/02/2022  Medication Sig   atorvastatin (LIPITOR) 80 MG tablet Take 80 mg by mouth at bedtime.    busPIRone (BUSPAR) 30 MG tablet Take 30 mg by mouth 2 (two)  times daily.   cetirizine (ZYRTEC) 10 MG tablet Take 1 tablet (10 mg total) by mouth daily.   citalopram (CELEXA) 20 MG tablet Take 20 mg by mouth daily.   clopidogrel (PLAVIX) 75 MG tablet Take 1 tablet (75 mg total) by mouth daily.   esomeprazole (NEXIUM) 40 MG capsule Take 1 capsule (40 mg total) by mouth in the morning.   ezetimibe (ZETIA) 10 MG tablet Take 10 mg by mouth at bedtime.   fenofibrate micronized (LOFIBRA) 134 MG capsule Take 134 mg by mouth at bedtime.   fluticasone (FLONASE) 50 MCG/ACT nasal spray PLACE 2 SPRAYS INTO BOTH NOSTRILS DAILY   gabapentin (NEURONTIN) 300 MG capsule Take 300 mg by mouth 2 (two) times daily.   gabapentin (NEURONTIN) 600 MG tablet Take 600 mg by mouth at bedtime.   GLYXAMBI 25-5 MG TABS Take 1 tablet by mouth daily.   HUMULIN R U-500 KWIKPEN 500 UNIT/ML kwikpen Inject 70 Units into the skin 3 (three) times daily with meals.   Insulin Aspart FlexPen (NOVOLOG) 100 UNIT/ML Inject into the skin.   Insulin Pen Needle (PEN NEEDLES) 31G X 8 MM MISC    ketoconazole (NIZORAL) 2 % cream Apply 1 application. topically daily.   lamoTRIgine (LAMICTAL) 200 MG tablet Take 200 mg by mouth 2 (two) times daily.    linaclotide (LINZESS) 145 MCG CAPS capsule Take 1 capsule (145 mcg total) by mouth daily before breakfast.   lisinopril (ZESTRIL) 2.5 MG tablet Take 1 tablet (2.5 mg total) by mouth  daily.   metoprolol tartrate (LOPRESSOR) 25 MG tablet TAKE 1 TABLET BY MOUTH TWICE DAILY   nitroGLYCERIN (NITROSTAT) 0.4 MG SL tablet Place 1 tablet (0.4 mg total) under the tongue every 5 (five) minutes as needed for chest pain.   OneTouch Delica Lancets 95G MISC by Does not apply route.   promethazine (PHENERGAN) 12.5 MG tablet Take 1 tablet (12.5 mg total) by mouth every 6 (six) hours as needed.   QUEtiapine (SEROQUEL) 200 MG tablet Take 200 mg by mouth at bedtime. Along with 335m dose for a total of 5024m  QUEtiapine (SEROQUEL) 300 MG tablet Take 300 mg by mouth at bedtime.  Along with 20081mose for a total of 500m64mSUMAtriptan (IMITREX) 100 MG tablet TAKE 1 TABLET BY MOUTH ONCE FOR 1 DOSE. MAY REPEAT IN 2 HOURS IF HEADACHE PERSISTS OR RECURS.   SYNTHROID 100 MCG tablet Take 100 mcg by mouth daily.   TRULICITY 1.5 MG/0LO/7.5IEN Inject 1.5 mg into the skin once a week. (Wednesdays)   Ubrogepant (UBRELVY) 100 MG TABS Take 1 tablet by mouth every other day.   [DISCONTINUED] fluconazole (DIFLUCAN) 150 MG tablet Take 1 tablet (150 mg total) by mouth daily.   No facility-administered encounter medications on file as of 02/02/2022.    Allergies (verified) Mirtazapine, Morphine and related, Sulfamethoxazole-trimethoprim, Vicodin hp [hydrocodone-acetaminophen], Amoxicillin er, Codeine, Hydrocodone-acetaminophen, Keflex [cephalexin], and Prasterone   History: Past Medical History:  Diagnosis Date   Anxiety    Arthritis    Asthma    Bipolar depression (HCC)[place] CHF (congestive heart failure) (HCC)    Chronic headaches    migraines have lessened   Chronic kidney disease    ddstage 111   COPD (chronic obstructive pulmonary disease) (HCC)[place] Coronary artery disease    Depression    Diabetic neuropathy (HCC)    Dyspnea    uses inhalers for this   Dysrhythmia    rapid   Fatty liver    GERD (gastroesophageal reflux disease)    Headache    Heart attack (HCC)[id number]   Hyperlipidemia    Hypertension    Hypomagnesemia    Hypothyroid    Irregular heart rhythm    Liver lesion    Favored to be benign on CT; MRI of abd w/contract in Aug 2016. Liver Biopsy-Negative, March 2016   Morbid obesity (HCC)[place] OSA (obstructive sleep apnea)    Peripheral vascular disease (HCC)    Pinched nerve    Back   Pneumonia    RLS (restless legs syndrome)    Seasonal allergies    Sleep apnea    uses oxygen at night 2L d/t cpap did not fit   Type 2 diabetes, uncontrolled, with neuropathy 10/15/2015   Overview:  Microalbumin 7.7 12/2010: Hgb A1c 10.2% 12/2010 and 9.7% 03/2011, [facility]    Urinary incontinence    Vitamin D deficiency    Past Surgical History:  Procedure Laterality Date   ABDOMINAL HYSTERECTOMY     Partial   bladder stim  2007   BLADDER SURGERY     x 2. tacking of bladder and the other was the stimulator placement   CHOLECYSTECTOMY     COLONOSCOPY WITH PROPOFOL N/A 03/04/2019   Procedure: COLONOSCOPY WITH PROPOFOL;  Surgeon: WohlLucilla Lame;  [facility];  Service: Endoscopy;  Laterality: N/A;   ESOPHAGOGASTRODUODENOSCOPY (EGD) WITH PROPOFOL N/A 03/04/2019   Procedure: ESOPHAGOGASTRODUODENOSCOPY (EGD) WITH PROPOFOL;  Surgeon: [name], MD;  Location[facility] END[facility];  Service: Endoscopy;  Laterality: N/A;   INCISION AND DRAINAGE ABSCESS Right 04/16/2019   Procedure: INCISION AND DRAINAGE ABSCESS;  Surgeon: Benjamine Sprague, DO;  Location: [facility] ORS;  Service: General;  Laterality: Right;   INTERSTIM-GENERATOR CHANGE N/A 12/31/2017   Procedure: INTERSTIM-GENERATOR CHANGE;  Surgeon: Bjorn Loser, MD;  Location: [facility] ORS;  Service: Urology;  Laterality: N/A;   LIVER BIOPSY  March 2016   Negative   LUNG BIOPSY  2015   small spot on lung   TUBAL LIGATION     UPPER GI ENDOSCOPY     WRIST SURGERY Right 11/02/2019   Family History  Problem Relation Age of Onset   Diabetes Mother    Heart disease Mother    Hyperlipidemia Mother    Hypertension Mother    Kidney disease Mother    Mental illness Mother    Hypothyroidism Mother    Stroke Mother    Osteoporosis Mother    Glaucoma Mother    Congestive Heart Failure Mother    Hypertension Father    Kidney disease Brother    Intracerebral hemorrhage Brother    Breast cancer Maternal Grandmother 51   Heart disease Maternal Grandfather    Rheum arthritis Maternal Grandfather    Cancer Maternal Grandfather        liver   Kidney disease Maternal Grandfather    Cancer Paternal Grandmother        lung   Asthma Daughter    Cancer Daughter        throat   Bipolar  disorder Daughter    Bipolar disorder Daughter    Hypertension Daughter    Restless legs syndrome Daughter    Social History   Socioeconomic History   Marital status: Married    Spouse name: Not on file   Number of children: 2   Years of education: 12   Highest education level: Not on file  Occupational History   Occupation: disabled  Tobacco Use   Smoking status: Never   Smokeless tobacco: Never  Vaping Use   Vaping Use: Never used  Substance and Sexual Activity   Alcohol use: No   Drug use: No   Sexual activity: Yes    Partners: Male    Birth control/protection: Surgical  Other Topics Concern   Not on file  Social History Narrative   Pt lives with husband and grandchildren   Social Determinants of Health   Financial Resource Strain: Low Risk    Difficulty of Paying Living Expenses: Not very hard  Food Insecurity: Food Insecurity Present   Worried About Charity fundraiser in the Last Year: Sometimes true   Ran Out of Food in the Last Year: Never true  Transportation Needs: No Transportation Needs   Lack of Transportation (Medical): No   Lack of Transportation (Non-Medical): No  Physical Activity: Inactive   Days of Exercise per Week: 0 days   Minutes of Exercise per Session: 0 min  Stress: No Stress Concern Present   Feeling of Stress : Only a little  Social Connections: Moderately Isolated   Frequency of Communication with Friends and Family: More than three times a week   Frequency of Social Gatherings with Friends and Family: More than three times a week   Attends Religious Services: Never   Marine scientist or Organizations: No   Attends Archivist Meetings: Never   Marital Status: Married    Tobacco Counseling Counseling given:  Not Answered   Clinical Intake:  Pre-visit preparation completed: Yes  Pain : No/denies pain     Nutritional Risks: None Diabetes: Yes CBG done?: No Did pt. bring in CBG monitor from home?:  No  Nutrition Risk Assessment:  Has the patient had any N/V/D within the last 2 months?  Yes Does the patient have any non-healing wounds?  No  Has the patient had any unintentional weight loss or weight gain?  No   Diabetes:  Is the patient diabetic?  Yes  If diabetic, was a CBG obtained today?  No  Did the patient bring in their glucometer from home?  No  How often do you monitor your CBG's? daily.   Financial Strains and Diabetes Management:  Are you having any financial strains with the device, your supplies or your medication? No .  Does the patient want to be seen by Chronic Care Management for management of their diabetes?  No  Would the patient like to be referred to a Nutritionist or for Diabetic Management?  No   Diabetic Exams:  Diabetic Eye Exam: Overdue for diabetic eye exam. Pt has been advised about the importance in completing this exam.   Diabetic Foot Exam: Completed 12/28/21.   How often do you need to have someone help you when you read instructions, pamphlets, or other written materials from your doctor or pharmacy?: 1 - Never    Interpreter Needed?: No  Information entered by :: Stacey Marker LPN   Activities of Daily Living    02/02/2022    2:19 PM 12/28/2021   11:41 AM  In your present state of health, do you have any difficulty performing the following activities:  Hearing? 0 0  Vision? 1 1  Difficulty concentrating or making decisions? 0 0  Walking or climbing stairs? 1 1  Dressing or bathing? 0 0  Doing errands, shopping? 0 0  Preparing Food and eating ? N   Using the Toilet? N   In the past six months, have you accidently leaked urine? Y   Do you have problems with loss of bowel control? N   Managing your Medications? N   Managing your Finances? N   Housekeeping or managing your Housekeeping? N     Patient Care Team: Steele Sizer, MD as PCP - General (Family Medicine) [name], MD as Consulting Physician  (Gastroenterology) Dagoberto Ligas, MD as Referring Physician (Nephrology) Minna Merritts, MD as Consulting Physician (Cardiology) Lenard Simmer, MD as Attending Physician (Endocrinology) Sharol Given, NP as Nurse Practitioner (Psychiatry) Neldon Labella, RN as Case Manager  Indicate any recent Medical Services you may have received from other than [facility] providers in the past year (date may be approximate).     Assessment:   This is a routine wellness examination for [place].  Hearing/Vision screen Hearing Screening - Comments:: Pt denies hearing difficulty  Vision Screening - Comments:: [facility] screen[facility]; due for exam  Dietary issues and exercise activities discussed: Current Exercise Habits: The patient does not participate in regular exercise at present, Exercise limited by: orthopedic condition(s);neurologic condition(s)   Goals Addressed   None    Depression Screen    02/02/2022    2:14 PM 12/28/2021   12:01 PM 12/28/2021   11:41 AM 11/03/2021    9:28 AM 09/08/2021    8:06 AM 06/30/2021   10:33 AM 05/19/2021   10:27 AM  PHQ 2/9 Scores  PHQ - 2 Score 2 2 0  0 0 3 1  PHQ- 9 Score 8 9   0 13 9    Fall Risk    02/02/2022    2:19 PM 12/28/2021   11:41 AM 11/03/2021    9:27 AM 09/29/2021    4:30 PM 09/29/2021    4:29 PM  Fall Risk   Falls in the past year? 1 1 0  1  Comment    Reports multiple falls since last outreach   Number falls in past yr: 1 1 0  1  Injury with Fall? 0 0 0  0  Risk for fall due to : History of fall(s)    History of fall(s);Impaired balance/gait  Follow up Falls prevention discussed Falls prevention discussed;Education provided Falls evaluation completed  Falls prevention discussed    FALL RISK PREVENTION PERTAINING TO THE HOME:  Any stairs in or around the home? Yes  If so, are there any without handrails? No  Home free of loose throw rugs in walkways, pet beds, electrical cords, etc? Yes  Adequate lighting in  your home to reduce risk of falls? Yes   ASSISTIVE DEVICES UTILIZED TO PREVENT FALLS:  Life alert? No  Use of a cane, walker or w/c? No  Grab bars in the bathroom? No  Shower chair or bench in shower? Yes  Elevated toilet seat or a handicapped toilet? Yes   TIMED UP AND GO:  Was the test performed? No .  Telephonic visit.   Cognitive Function: Normal cognitive status assessed by direct observation by this Nurse Health Advisor. No abnormalities found.          10/30/2017    3:49 PM  6CIT Screen  What Year? 0 points  What month? 0 points  What time? 0 points  Count back from 20 2 points  Months in reverse 0 points  Repeat phrase 2 points  Total Score 4 points    Immunizations Immunization History  Administered Date(s) Administered   Influenza Split 10/06/2013   Influenza Whole 07/17/2007, 06/08/2008, 09/23/2011   Influenza, Seasonal, Injecte, Preservative Fre 09/26/2012   Influenza,inj,Quad PF,6+ Mos 05/13/2015, 09/28/2016, 07/18/2018, 06/15/2020, 05/19/2021   Influenza-Unspecified 05/26/2014, 05/13/2015, 09/28/2016, 07/18/2018   Pneumococcal Conjugate-13 09/23/2011, 11/16/2011   Pneumococcal Polysaccharide-23 11/15/2007, 05/09/2013   Tdap 05/13/2012, 04/01/2014, 11/09/2020   Zoster Recombinat (Shingrix) 05/19/2021, 12/28/2021    TDAP status: Up to date  Flu Vaccine status: Up to date  Pneumococcal vaccine status: Up to date  Covid-19 vaccine status: Information provided on how to obtain vaccines.   Qualifies for Shingles Vaccine? Yes   Zostavax completed No   Shingrix Completed?: Yes  Screening Tests Health Maintenance  Topic Date Due   OPHTHALMOLOGY EXAM  10/23/2018   MAMMOGRAM  11/06/2018   INFLUENZA VACCINE  04/04/2022   HEMOGLOBIN A1C  04/04/2022   FOOT EXAM  12/29/2022   COLONOSCOPY (Pts 45-15yr Insurance coverage will need to be confirmed)  03/03/2024   TETANUS/TDAP  11/10/2030   Hepatitis C Screening  Completed   HIV Screening  Completed    Zoster Vaccines- Shingrix  Completed   HPV VACCINES  Aged Out   COVID-19 Vaccine  Discontinued    Health Maintenance  Health Maintenance Due  Topic Date Due   OPHTHALMOLOGY EXAM  10/23/2018   MAMMOGRAM  11/06/2018    Colorectal cancer screening: Type of screening: Colonoscopy. Completed 03/04/19. Repeat every 5 years  Mammogram status: Completed 11/05/17. Repeat every year. Ordered 12/28/21  Bone density status: due age 56 Lung Cancer Screening: (Low  Dose CT Chest recommended if Age 47-80 years, 30 pack-year currently smoking OR have quit w/in 15years.) does not qualify.   Additional Screening:  Hepatitis C Screening: does qualify; Completed 01/05/09  Vision Screening: Recommended annual ophthalmology exams for early detection of glaucoma and other disorders of the eye. Is the patient up to date with their annual eye exam?  No  Who is the provider or what is the name of the office in which the patient attends annual eye exams? Dr. Ellin Mayhew.   Dental Screening: Recommended annual dental exams for proper oral hygiene  Community Resource Referral / Chronic Care Management: CRR required this visit?  Yes   CCM required this visit?  No      Plan:     I have personally reviewed and noted the following in the patient's chart:   Medical and social history Use of alcohol, tobacco or illicit drugs  Current medications and supplements including opioid prescriptions.  Functional ability and status Nutritional status Physical activity Advanced directives List of other physicians Hospitalizations, surgeries, and ER visits in previous 12 months Vitals Screenings to include cognitive, depression, and falls Referrals and appointments  In addition, I have reviewed and discussed with patient certain preventive protocols, quality metrics, and best practice recommendations. A written personalized care plan for preventive services as well as general preventive health recommendations were  provided to patient.     Stacey Marker, LPN   02/09/1244   Nurse Notes: pt c/o episodes of feeling dizzy, weak and overall unwell. Pt reports falling off of scale at Dr. Barkley Boards office yesterday. Per patient Dr. Carmela Rima concerned it may be her medication. Pt advised to schedule appt with PCP if sxs worsen or persist or go to urgent care/ER for immediate needs.

## 2022-02-02 NOTE — Patient Instructions (Signed)
[name] , Thank you for taking time to come for your Medicare Wellness Visit. I appreciate your ongoing commitment to your health goals. Please review the following plan we discussed and let me know if I can assist you in the future.   Screening recommendations/referrals: Colonoscopy: done 03/04/19. Repeat 02/2024 Mammogram: done 11/05/17. Please call [phone] to schedule your mammogram.  Bone Density: due age 56 Recommended yearly ophthalmology/optometry visit for glaucoma screening and checkup Recommended yearly dental visit for hygiene and checkup  Vaccinations: Influenza vaccine: done 05/19/21 Pneumococcal vaccine: done 05/09/13 Tdap vaccine: done 11/09/20 Shingles vaccine: done 05/19/21 & 12/28/21  Covid-19: due  Advanced directives: Advance directive discussed with you today. Even though you declined this today please call our office should you change your mind and we can give you the proper paperwork for you to fill out.   Conditions/risks identified: recommend increasing physical activity   Next appointment: Follow up in one year for your annual wellness visit.   Preventive Care 40-64 Years, Female Preventive care refers to lifestyle choices and visits with your health care provider that can promote health and wellness. What does preventive care include? A yearly physical exam. This is also called an annual well check. Dental exams once or twice a year. Routine eye exams. Ask your health care provider how often you should have your eyes checked. Personal lifestyle choices, including: Daily care of your teeth and gums. Regular physical activity. Eating a healthy diet. Avoiding tobacco and drug use. Limiting alcohol use. Practicing safe sex. Taking low-dose aspirin daily starting at age 12. Taking vitamin and mineral supplements as recommended by your health care provider. What happens during an annual well check? The services and screenings done by your health care provider  during your annual well check will depend on your age, overall health, lifestyle risk factors, and family history of disease. Counseling  Your health care provider may ask you questions about your: Alcohol use. Tobacco use. Drug use. Emotional well-being. Home and relationship well-being. Sexual activity. Eating habits. Work and work Statistician. Method of birth control. Menstrual cycle. Pregnancy history. Screening  You may have the following tests or measurements: Height, weight, and BMI. Blood pressure. Lipid and cholesterol levels. These may be checked every 5 years, or more frequently if you are over 71 years old. Skin check. Lung cancer screening. You may have this screening every year starting at age 70 if you have a 30-pack-year history of smoking and currently smoke or have quit within the past 15 years. Fecal occult blood test (FOBT) of the stool. You may have this test every year starting at age 39. Flexible sigmoidoscopy or colonoscopy. You may have a sigmoidoscopy every 5 years or a colonoscopy every 10 years starting at age 24. Hepatitis C blood test. Hepatitis B blood test. Sexually transmitted disease (STD) testing. Diabetes screening. This is done by checking your blood sugar (glucose) after you have not eaten for a while (fasting). You may have this done every 1-3 years. Mammogram. This may be done every 1-2 years. Talk to your health care provider about when you should start having regular mammograms. This may depend on whether you have a family history of breast cancer. BRCA-related cancer screening. This may be done if you have a family history of breast, ovarian, tubal, or peritoneal cancers. Pelvic exam and Pap test. This may be done every 3 years starting at age 18. Starting at age 36, this may be done every 5 years if you have a  Pap test in combination with an HPV test. Bone density scan. This is done to screen for osteoporosis. You may have this scan if you are  at high risk for osteoporosis. Discuss your test results, treatment options, and if necessary, the need for more tests with your health care provider. Vaccines  Your health care provider may recommend certain vaccines, such as: Influenza vaccine. This is recommended every year. Tetanus, diphtheria, and acellular pertussis (Tdap, Td) vaccine. You may need a Td booster every 10 years. Zoster vaccine. You may need this after age 75. Pneumococcal 13-valent conjugate (PCV13) vaccine. You may need this if you have certain conditions and were not previously vaccinated. Pneumococcal polysaccharide (PPSV23) vaccine. You may need one or two doses if you smoke cigarettes or if you have certain conditions. Talk to your health care provider about which screenings and vaccines you need and how often you need them. This information is not intended to replace advice given to you by your health care provider. Make sure you discuss any questions you have with your health care provider. Document Released: 09/17/2015 Document Revised: 05/10/2016 Document Reviewed: 06/22/2015 Elsevier Interactive Patient Education  2017 [place] Prevention in the Home Falls can cause injuries. They can happen to people of all ages. There are many things you can do to make your home safe and to help prevent falls. What can I do on the outside of my home? Regularly fix the edges of walkways and driveways and fix any cracks. Remove anything that might make you trip as you walk through a door, such as a raised step or threshold. Trim any bushes or trees on the path to your home. Use bright outdoor lighting. Clear any walking paths of anything that might make someone trip, such as rocks or tools. Regularly check to see if handrails are loose or broken. Make sure that both sides of any steps have handrails. Any raised decks and porches should have guardrails on the edges. Have any leaves, snow, or ice cleared  regularly. Use sand or salt on walking paths during winter. Clean up any spills in your garage right away. This includes oil or grease spills. What can I do in the bathroom? Use night lights. Install grab bars by the toilet and in the tub and shower. Do not use towel bars as grab bars. Use non-skid mats or decals in the tub or shower. If you need to sit down in the shower, use a plastic, non-slip stool. Keep the floor dry. Clean up any water that spills on the floor as soon as it happens. Remove soap buildup in the tub or shower regularly. Attach bath mats securely with double-sided non-slip rug tape. Do not have throw rugs and other things on the floor that can make you trip. What can I do in the bedroom? Use night lights. Make sure that you have a light by your bed that is easy to reach. Do not use any sheets or blankets that are too big for your bed. They should not hang down onto the floor. Have a firm chair that has side arms. You can use this for support while you get dressed. Do not have throw rugs and other things on the floor that can make you trip. What can I do in the kitchen? Clean up any spills right away. Avoid walking on wet floors. Keep items that you use a lot in easy-to-reach places. If you need to reach something above you, use a  strong step stool that has a grab bar. Keep electrical cords out of the way. Do not use floor polish or wax that makes floors slippery. If you must use wax, use non-skid floor wax. Do not have throw rugs and other things on the floor that can make you trip. What can I do with my stairs? Do not leave any items on the stairs. Make sure that there are handrails on both sides of the stairs and use them. Fix handrails that are broken or loose. Make sure that handrails are as long as the stairways. Check any carpeting to make sure that it is firmly attached to the stairs. Fix any carpet that is loose or worn. Avoid having throw rugs at the top or  bottom of the stairs. If you do have throw rugs, attach them to the floor with carpet tape. Make sure that you have a light switch at the top of the stairs and the bottom of the stairs. If you do not have them, ask someone to add them for you. What else can I do to help prevent falls? Wear shoes that: Do not have high heels. Have rubber bottoms. Are comfortable and fit you well. Are closed at the toe. Do not wear sandals. If you use a stepladder: Make sure that it is fully opened. Do not climb a closed stepladder. Make sure that both sides of the stepladder are locked into place. Ask someone to hold it for you, if possible. Clearly mark and make sure that you can see: Any grab bars or handrails. First and last steps. Where the edge of each step is. Use tools that help you move around (mobility aids) if they are needed. These include: Canes. Walkers. Scooters. Crutches. Turn on the lights when you go into a dark area. Replace any light bulbs as soon as they burn out. Set up your furniture so you have a clear path. Avoid moving your furniture around. If any of your floors are uneven, fix them. If there are any pets around you, be aware of where they are. Review your medicines with your doctor. Some medicines can make you feel dizzy. This can increase your chance of falling. Ask your doctor what other things that you can do to help prevent falls. This information is not intended to replace advice given to you by your health care provider. Make sure you discuss any questions you have with your health care provider. Document Released: 06/17/2009 Document Revised: 01/27/2016 Document Reviewed: 09/25/2014 Elsevier Interactive Patient Education  2017 [employer].

## 2022-02-05 ENCOUNTER — Emergency Department
Admission: EM | Admit: 2022-02-05 | Discharge: 2022-02-06 | Disposition: A | Discharge status: Home / Self Care | Payer: Medicare Other | Attending: Emergency Medicine | Admitting: Emergency Medicine

## 2022-02-05 ENCOUNTER — Other Ambulatory Visit: Payer: Self-pay

## 2022-02-05 DIAGNOSIS — R110 Nausea: Secondary | ICD-10-CM | POA: Diagnosis not present

## 2022-02-05 DIAGNOSIS — R1111 Vomiting without nausea: Secondary | ICD-10-CM | POA: Diagnosis not present

## 2022-02-05 DIAGNOSIS — K529 Noninfective gastroenteritis and colitis, unspecified: Secondary | ICD-10-CM | POA: Insufficient documentation

## 2022-02-05 DIAGNOSIS — R112 Nausea with vomiting, unspecified: Secondary | ICD-10-CM | POA: Diagnosis present

## 2022-02-05 LAB — CBC WITH DIFFERENTIAL/PLATELET
Abs Immature Granulocytes: 0.07 10*3/uL (ref 0.00–0.07)
Basophils Absolute: 0 10*3/uL (ref 0.0–0.1)
Basophils Relative: 0 %
Eosinophils Absolute: 0.1 10*3/uL (ref 0.0–0.5)
Eosinophils Relative: 1 %
HCT: 42.2 % (ref 36.0–46.0)
Hemoglobin: 13.2 g/dL (ref 12.0–15.0)
Immature Granulocytes: 1 %
Lymphocytes Relative: 10 %
Lymphs Abs: 0.9 10*3/uL (ref 0.7–4.0)
MCH: 28.9 pg (ref 26.0–34.0)
MCHC: 31.3 g/dL (ref 30.0–36.0)
MCV: 92.3 fL (ref 80.0–100.0)
Monocytes Absolute: 0.4 10*3/uL (ref 0.1–1.0)
Monocytes Relative: 5 %
Neutro Abs: 7.7 10*3/uL (ref 1.7–7.7)
Neutrophils Relative %: 83 %
Platelets: 229 10*3/uL (ref 150–400)
RBC: 4.57 MIL/uL (ref 3.87–5.11)
RDW: 13.4 % (ref 11.5–15.5)
WBC: 9.2 10*3/uL (ref 4.0–10.5)
nRBC: 0 % (ref 0.0–0.2)

## 2022-02-05 LAB — COMPREHENSIVE METABOLIC PANEL
ALT: 38 U/L (ref 0–44)
AST: 46 U/L — ABNORMAL HIGH (ref 15–41)
Albumin: 3.9 g/dL (ref 3.5–5.0)
Alkaline Phosphatase: 37 U/L — ABNORMAL LOW (ref 38–126)
Anion gap: 10 (ref 5–15)
BUN: 35 mg/dL — ABNORMAL HIGH (ref 6–20)
CO2: 26 mmol/L (ref 22–32)
Calcium: 9.5 mg/dL (ref 8.9–10.3)
Chloride: 107 mmol/L (ref 98–111)
Creatinine, Ser: 1.2 mg/dL — ABNORMAL HIGH (ref 0.44–1.00)
GFR, Estimated: 52 mL/min — ABNORMAL LOW (ref >60)
Glucose, Bld: 118 mg/dL — ABNORMAL HIGH (ref 70–99)
Potassium: 5 mmol/L (ref 3.5–5.1)
Sodium: 143 mmol/L (ref 135–145)
Total Bilirubin: 1.2 mg/dL (ref 0.3–1.2)
Total Protein: 6.9 g/dL (ref 6.5–8.1)

## 2022-02-05 LAB — LIPASE, BLOOD: Lipase: 40 U/L (ref 11–51)

## 2022-02-05 MED ORDER — PROMETHAZINE HCL 25 MG RE SUPP: 25.0000 mg | Freq: Four times a day (QID) | RECTAL | 0 refills | Status: DC | PRN

## 2022-02-05 MED ORDER — SODIUM CHLORIDE 0.9 % IV BOLUS
1000.0000 mL | Freq: Once | INTRAVENOUS | Status: AC
  Administered 2022-02-05: 1000 mL via INTRAVENOUS

## 2022-02-05 MED ORDER — ONDANSETRON HCL 4 MG/2ML IJ SOLN
4.0000 mg | Freq: Once | INTRAMUSCULAR | Status: AC
  Administered 2022-02-05: 4 mg via INTRAVENOUS
  Filled 2022-02-05: qty 2

## 2022-02-05 MED ORDER — ONDANSETRON HCL 4 MG/2ML IJ SOLN
4.0000 mg | Freq: Once | INTRAMUSCULAR | Status: AC
Start: 2022-02-05 — End: 2022-02-05
  Administered 2022-02-05: 4 mg via INTRAVENOUS
  Filled 2022-02-05: qty 2

## 2022-02-05 NOTE — ED Triage Notes (Signed)
[facility] reports pt coming from home c/o n/v/d that started this morning. Feels dizzy and weak.

## 2022-02-05 NOTE — ED Provider Notes (Signed)
[facility] Provider Note    Event Date/Time   First MD Initiated Contact with Patient 02/05/22 1902     (approximate)   History   Emesis   HPI  [name] is a 56 y.o. female  who, per GI note dated 01/18/22 has history of abdominal pain and nausea, who presents to the emergency department today because of concern for nausea, vomiting and concern for dehydration. The patient states that her symptoms started this morning. She denies any unusual ingestions or sick contacts yesterday.  She says she has had multiple episodes of vomiting.  She does have nausea medication at home although she was not able to keep it down.  She denies any abdominal pain today.  She denies any fevers.  Denies any bloody vomiting or diarrhea.  Physical Exam   Triage Vital Signs: ED Triage Vitals  Enc Vitals Group     BP 02/05/22 1856 137/68     Pulse Rate 02/05/22 1856 (!) 107     Resp 02/05/22 1856 16     Temp 02/05/22 1856 98.2 F (36.8 C)     Temp Source 02/05/22 1856 Oral     SpO2 02/05/22 1856 98 %     Weight 02/05/22 1854 189 lb (85.7 kg)     Height 02/05/22 1854 4' 11"  (1.499 m)     Head Circumference --      Peak Flow --      Pain Score 02/05/22 1853 0     Pain Loc --      Pain Edu? --      Excl. in [place]? --     Most recent vital signs: Vitals:   02/05/22 1856  BP: 137/68  Pulse: (!) 107  Resp: 16  Temp: 98.2 F (36.8 C)  SpO2: 98%   General: Awake, alert, oriented. CV:  Good peripheral perfusion. Tachycardia Resp:  Normal effort. Lungs clear to auscultation Abd:  No distention.   ED Results / Procedures / Treatments   Labs (all labs ordered are listed, but only abnormal results are displayed) Labs Reviewed  COMPREHENSIVE METABOLIC PANEL - Abnormal; Notable for the following components:      Result Value   Glucose, Bld 118 (*)    BUN 35 (*)    Creatinine, Ser 1.20 (*)    AST 46 (*)    Alkaline Phosphatase 37 (*)    GFR, Estimated 52 (*)     All other components within normal limits  CBC WITH DIFFERENTIAL/PLATELET  LIPASE, BLOOD  URINALYSIS, ROUTINE W REFLEX MICROSCOPIC     EKG  None   RADIOLOGY None  PROCEDURES:  Critical Care performed: No  Procedures   MEDICATIONS ORDERED IN ED: Medications  sodium chloride 0.9 % bolus 1,000 mL (has no administration in time range)  ondansetron (ZOFRAN) injection 4 mg (has no administration in time range)     IMPRESSION / MDM / ASSESSMENT AND PLAN / ED COURSE  I reviewed the triage vital signs and the nursing notes.                              Differential diagnosis includes, but is not limited to, mesenteric ischemia, gastroenteritis, hepatitis, pancreatitis.   Patient's presentation is most consistent with acute presentation with potential threat to life or bodily function.  Patient presented to the emergency department today because of concerns for nausea vomiting and diarrhea.  Patient denies any sick  contacts or unusual ingestions.  On exam patient is tachycardic.  No abdominal tenderness.  Blood work without concerning leukocytosis.  While creatinine is slightly elevated it does not appear to be any worse than her baseline.  Given tachycardia however I was concerned for some dehydration.  She was given IV fluids which helped with her heart rate.  She was also given nausea medication.  Given lack of tenderness or leukocytosis I doubt significant focal infection.  This time I do not feel any emergent abdominal imaging is warranted.  We will plan on discharging patient.  Given her concern that she cannot keep nausea medication down will give her prescription for Phenergan suppository.   FINAL CLINICAL IMPRESSION(S) / ED DIAGNOSES   Final diagnoses:  Gastroenteritis    Note:  This document was prepared using Dragon voice recognition software and may include unintentional dictation errors.    Nance Pear, MD 02/05/22 2322

## 2022-02-05 NOTE — Discharge Instructions (Signed)
Please seek medical attention for any high fevers, chest pain, shortness of breath, change in behavior, persistent vomiting, bloody stool or any other new or concerning symptoms.  

## 2022-02-06 ENCOUNTER — Telehealth: Payer: Self-pay | Admitting: *Deleted

## 2022-02-06 NOTE — Telephone Encounter (Signed)
   Telephone encounter was:  Unsuccessful.  02/06/2022 Name: [name] MRN: [id number] DOB: [birth year]  Unsuccessful outbound call made today to assist with:  Food Insecurity  Outreach Attempt:  1st Attempt  A HIPAA compliant voice message was left requesting a return call.  Instructed patient to call back at   Instructed patient to call back at [phone]  at their earliest convenience. .  [place], Care Management  [phone] [address] , [place] [address] Email : [email or web address]

## 2022-02-06 NOTE — ED Notes (Signed)
Patient verbalized discharge understanding

## 2022-02-07 ENCOUNTER — Telehealth: Payer: Self-pay | Admitting: *Deleted

## 2022-02-07 ENCOUNTER — Other Ambulatory Visit: Payer: Self-pay | Admitting: Gastroenterology

## 2022-02-07 NOTE — Telephone Encounter (Signed)
   Telephone encounter was:  Unsuccessful.  02/07/2022 Name: [name] MRN: [id number] DOB: 06/01/1966  Unsuccessful outbound call made today to assist with:  Food Insecurity  Outreach Attempt:  2nd Attempt  A HIPAA compliant voice message was left requesting a return call.  Instructed patient to call back at   Instructed patient to call back at [phone]  at their earliest convenience. .  [place], Care Management  [phone] [address] , [place] [address] Email : [email or web address]

## 2022-02-08 ENCOUNTER — Telehealth: Payer: Self-pay | Admitting: *Deleted

## 2022-02-08 NOTE — Telephone Encounter (Signed)
   Telephone encounter was:  Successful.  02/08/2022 Name: JAIDA BASURTO MRN: [id number] DOB: 03-03-1966  Zena Amos Stutsman is a 56 y.o. year old female who is a primary care patient of Steele Sizer, MD . The community resource team was consulted for assistance with Food Insecurity and finance s  Care guide performed the following interventions: patient will return call as she is presently busy .  Follow Up Plan:  patient will return call as she is presently busy   [place], Care Management  [phone] [address] , [place] [address] Email : [email or web address]

## 2022-02-10 ENCOUNTER — Emergency Department: Payer: Medicare Other

## 2022-02-10 ENCOUNTER — Encounter: Payer: Self-pay | Admitting: Emergency Medicine

## 2022-02-10 ENCOUNTER — Other Ambulatory Visit: Payer: Self-pay

## 2022-02-10 ENCOUNTER — Inpatient Hospital Stay
Admission: EM | Admit: 2022-02-10 | Discharge: 2022-02-12 | DRG: 149 | Disposition: A | Discharge status: Home / Self Care | Payer: Medicare Other | Attending: Osteopathic Medicine | Admitting: Osteopathic Medicine

## 2022-02-10 DIAGNOSIS — Z7985 Long-term (current) use of injectable non-insulin antidiabetic drugs: Secondary | ICD-10-CM

## 2022-02-10 DIAGNOSIS — Z794 Long term (current) use of insulin: Secondary | ICD-10-CM

## 2022-02-10 DIAGNOSIS — E1140 Type 2 diabetes mellitus with diabetic neuropathy, unspecified: Secondary | ICD-10-CM | POA: Diagnosis not present

## 2022-02-10 DIAGNOSIS — N183 Chronic kidney disease, stage 3 unspecified: Secondary | ICD-10-CM | POA: Diagnosis not present

## 2022-02-10 DIAGNOSIS — R61 Generalized hyperhidrosis: Secondary | ICD-10-CM | POA: Diagnosis not present

## 2022-02-10 DIAGNOSIS — G2581 Restless legs syndrome: Secondary | ICD-10-CM | POA: Diagnosis present

## 2022-02-10 DIAGNOSIS — R2990 Unspecified symptoms and signs involving the nervous system: Secondary | ICD-10-CM

## 2022-02-10 DIAGNOSIS — E1122 Type 2 diabetes mellitus with diabetic chronic kidney disease: Secondary | ICD-10-CM | POA: Diagnosis not present

## 2022-02-10 DIAGNOSIS — J449 Chronic obstructive pulmonary disease, unspecified: Secondary | ICD-10-CM | POA: Diagnosis not present

## 2022-02-10 DIAGNOSIS — I252 Old myocardial infarction: Secondary | ICD-10-CM | POA: Diagnosis not present

## 2022-02-10 DIAGNOSIS — R29818 Other symptoms and signs involving the nervous system: Secondary | ICD-10-CM | POA: Diagnosis not present

## 2022-02-10 DIAGNOSIS — R2681 Unsteadiness on feet: Secondary | ICD-10-CM | POA: Diagnosis present

## 2022-02-10 DIAGNOSIS — R296 Repeated falls: Secondary | ICD-10-CM | POA: Diagnosis present

## 2022-02-10 DIAGNOSIS — Z888 Allergy status to other drugs, medicaments and biological substances status: Secondary | ICD-10-CM

## 2022-02-10 DIAGNOSIS — E785 Hyperlipidemia, unspecified: Secondary | ICD-10-CM | POA: Diagnosis not present

## 2022-02-10 DIAGNOSIS — G459 Transient cerebral ischemic attack, unspecified: Secondary | ICD-10-CM | POA: Diagnosis not present

## 2022-02-10 DIAGNOSIS — Z833 Family history of diabetes mellitus: Secondary | ICD-10-CM

## 2022-02-10 DIAGNOSIS — R42 Dizziness and giddiness: Principal | ICD-10-CM

## 2022-02-10 DIAGNOSIS — Z885 Allergy status to narcotic agent status: Secondary | ICD-10-CM

## 2022-02-10 DIAGNOSIS — I129 Hypertensive chronic kidney disease with stage 1 through stage 4 chronic kidney disease, or unspecified chronic kidney disease: Secondary | ICD-10-CM | POA: Diagnosis present

## 2022-02-10 DIAGNOSIS — K760 Fatty (change of) liver, not elsewhere classified: Secondary | ICD-10-CM | POA: Diagnosis present

## 2022-02-10 DIAGNOSIS — Z83438 Family history of other disorder of lipoprotein metabolism and other lipidemia: Secondary | ICD-10-CM

## 2022-02-10 DIAGNOSIS — F419 Anxiety disorder, unspecified: Secondary | ICD-10-CM | POA: Diagnosis present

## 2022-02-10 DIAGNOSIS — Z8262 Family history of osteoporosis: Secondary | ICD-10-CM

## 2022-02-10 DIAGNOSIS — E1151 Type 2 diabetes mellitus with diabetic peripheral angiopathy without gangrene: Secondary | ICD-10-CM | POA: Diagnosis present

## 2022-02-10 DIAGNOSIS — Z79890 Hormone replacement therapy (postmenopausal): Secondary | ICD-10-CM

## 2022-02-10 DIAGNOSIS — E669 Obesity, unspecified: Secondary | ICD-10-CM | POA: Diagnosis present

## 2022-02-10 DIAGNOSIS — Z6838 Body mass index (BMI) 38.0-38.9, adult: Secondary | ICD-10-CM | POA: Diagnosis not present

## 2022-02-10 DIAGNOSIS — K219 Gastro-esophageal reflux disease without esophagitis: Secondary | ICD-10-CM | POA: Diagnosis present

## 2022-02-10 DIAGNOSIS — I25118 Atherosclerotic heart disease of native coronary artery with other forms of angina pectoris: Secondary | ICD-10-CM | POA: Diagnosis present

## 2022-02-10 DIAGNOSIS — Z880 Allergy status to penicillin: Secondary | ICD-10-CM

## 2022-02-10 DIAGNOSIS — Z8249 Family history of ischemic heart disease and other diseases of the circulatory system: Secondary | ICD-10-CM

## 2022-02-10 DIAGNOSIS — E039 Hypothyroidism, unspecified: Secondary | ICD-10-CM | POA: Diagnosis not present

## 2022-02-10 DIAGNOSIS — Z90711 Acquired absence of uterus with remaining cervical stump: Secondary | ICD-10-CM

## 2022-02-10 DIAGNOSIS — Z9049 Acquired absence of other specified parts of digestive tract: Secondary | ICD-10-CM

## 2022-02-10 DIAGNOSIS — Z818 Family history of other mental and behavioral disorders: Secondary | ICD-10-CM

## 2022-02-10 DIAGNOSIS — R8290 Unspecified abnormal findings in urine: Secondary | ICD-10-CM | POA: Diagnosis present

## 2022-02-10 DIAGNOSIS — R159 Full incontinence of feces: Secondary | ICD-10-CM | POA: Diagnosis not present

## 2022-02-10 DIAGNOSIS — R4781 Slurred speech: Secondary | ICD-10-CM | POA: Diagnosis not present

## 2022-02-10 DIAGNOSIS — I10 Essential (primary) hypertension: Secondary | ICD-10-CM | POA: Diagnosis present

## 2022-02-10 DIAGNOSIS — Z803 Family history of malignant neoplasm of breast: Secondary | ICD-10-CM

## 2022-02-10 DIAGNOSIS — Z823 Family history of stroke: Secondary | ICD-10-CM

## 2022-02-10 DIAGNOSIS — J341 Cyst and mucocele of nose and nasal sinus: Secondary | ICD-10-CM | POA: Diagnosis not present

## 2022-02-10 DIAGNOSIS — F250 Schizoaffective disorder, bipolar type: Secondary | ICD-10-CM | POA: Diagnosis present

## 2022-02-10 DIAGNOSIS — R112 Nausea with vomiting, unspecified: Secondary | ICD-10-CM | POA: Diagnosis not present

## 2022-02-10 DIAGNOSIS — N390 Urinary tract infection, site not specified: Secondary | ICD-10-CM | POA: Diagnosis present

## 2022-02-10 DIAGNOSIS — Z79899 Other long term (current) drug therapy: Secondary | ICD-10-CM

## 2022-02-10 DIAGNOSIS — Z825 Family history of asthma and other chronic lower respiratory diseases: Secondary | ICD-10-CM

## 2022-02-10 DIAGNOSIS — Z841 Family history of disorders of kidney and ureter: Secondary | ICD-10-CM

## 2022-02-10 DIAGNOSIS — G4733 Obstructive sleep apnea (adult) (pediatric): Secondary | ICD-10-CM | POA: Diagnosis present

## 2022-02-10 DIAGNOSIS — R1111 Vomiting without nausea: Secondary | ICD-10-CM | POA: Diagnosis not present

## 2022-02-10 DIAGNOSIS — Z83511 Family history of glaucoma: Secondary | ICD-10-CM

## 2022-02-10 DIAGNOSIS — Z8261 Family history of arthritis: Secondary | ICD-10-CM

## 2022-02-10 DIAGNOSIS — R531 Weakness: Secondary | ICD-10-CM | POA: Diagnosis not present

## 2022-02-10 LAB — URINALYSIS, ROUTINE W REFLEX MICROSCOPIC
Bilirubin Urine: NEGATIVE
Glucose, UA: >=500 mg/dL — AB
Hgb urine dipstick: NEGATIVE
Ketones, ur: NEGATIVE mg/dL
Nitrite: NEGATIVE
Protein, ur: NEGATIVE mg/dL
Specific Gravity, Urine: 1.025 (ref 1.005–1.030)
pH: 5 (ref 5.0–8.0)

## 2022-02-10 LAB — CBC
HCT: 36.8 % (ref 36.0–46.0)
Hemoglobin: 11.4 g/dL — ABNORMAL LOW (ref 12.0–15.0)
MCH: 29.7 pg (ref 26.0–34.0)
MCHC: 31 g/dL (ref 30.0–36.0)
MCV: 95.8 fL (ref 80.0–100.0)
Platelets: 183 10*3/uL (ref 150–400)
RBC: 3.84 MIL/uL — ABNORMAL LOW (ref 3.87–5.11)
RDW: 13.1 % (ref 11.5–15.5)
WBC: 5.3 10*3/uL (ref 4.0–10.5)
nRBC: 0 % (ref 0.0–0.2)

## 2022-02-10 LAB — BASIC METABOLIC PANEL
Anion gap: 7 (ref 5–15)
BUN: 14 mg/dL (ref 6–20)
CO2: 25 mmol/L (ref 22–32)
Calcium: 8.6 mg/dL — ABNORMAL LOW (ref 8.9–10.3)
Chloride: 109 mmol/L (ref 98–111)
Creatinine, Ser: 0.98 mg/dL (ref 0.44–1.00)
GFR, Estimated: >60 mL/min (ref >60)
Glucose, Bld: 252 mg/dL — ABNORMAL HIGH (ref 70–99)
Potassium: 4.3 mmol/L (ref 3.5–5.1)
Sodium: 141 mmol/L (ref 135–145)

## 2022-02-10 LAB — POC URINE PREG, ED: Preg Test, Ur: NEGATIVE

## 2022-02-10 LAB — GLUCOSE, CAPILLARY: Glucose-Capillary: 143 mg/dL — ABNORMAL HIGH (ref 70–99)

## 2022-02-10 LAB — HIV ANTIBODY (ROUTINE TESTING W REFLEX): HIV Screen 4th Generation wRfx: NONREACTIVE

## 2022-02-10 MED ORDER — UBROGEPANT 100 MG PO TABS: 1.0000 | ORAL_TABLET | ORAL | Status: DC

## 2022-02-10 MED ORDER — LAMOTRIGINE 100 MG PO TABS
200.0000 mg | ORAL_TABLET | Freq: Two times a day (BID) | ORAL | Status: DC
  Administered 2022-02-10 – 2022-02-12 (×5): 200 mg via ORAL
  Filled 2022-02-10 (×5): qty 2

## 2022-02-10 MED ORDER — SODIUM CHLORIDE 0.9 % IV BOLUS
1000.0000 mL | Freq: Once | INTRAVENOUS | Status: AC
  Administered 2022-02-10: 1000 mL via INTRAVENOUS

## 2022-02-10 MED ORDER — FOSFOMYCIN TROMETHAMINE 3 G PO PACK
3.0000 g | PACK | Freq: Once | ORAL | Status: AC
Start: 2022-02-10 — End: 2022-02-10
  Administered 2022-02-10: 3 g via ORAL
  Filled 2022-02-10: qty 3

## 2022-02-10 MED ORDER — METOCLOPRAMIDE HCL 5 MG/ML IJ SOLN
10.0000 mg | Freq: Three times a day (TID) | INTRAMUSCULAR | Status: AC
Start: 2022-02-10 — End: 2022-02-10
  Administered 2022-02-10 (×2): 10 mg via INTRAVENOUS
  Filled 2022-02-10 (×2): qty 2

## 2022-02-10 MED ORDER — IOHEXOL 350 MG/ML SOLN
75.0000 mL | Freq: Once | INTRAVENOUS | Status: AC | PRN
  Administered 2022-02-10: 75 mL via INTRAVENOUS

## 2022-02-10 MED ORDER — CITALOPRAM HYDROBROMIDE 20 MG PO TABS
20.0000 mg | ORAL_TABLET | Freq: Every day | ORAL | Status: DC
  Administered 2022-02-10 – 2022-02-12 (×3): 20 mg via ORAL
  Filled 2022-02-10 (×3): qty 1

## 2022-02-10 MED ORDER — SUMATRIPTAN SUCCINATE 50 MG PO TABS
100.0000 mg | ORAL_TABLET | ORAL | Status: DC | PRN
  Filled 2022-02-10: qty 2

## 2022-02-10 MED ORDER — BUSPIRONE HCL 10 MG PO TABS
30.0000 mg | ORAL_TABLET | Freq: Two times a day (BID) | ORAL | Status: DC
  Administered 2022-02-10 – 2022-02-12 (×4): 30 mg via ORAL
  Filled 2022-02-10 (×4): qty 3

## 2022-02-10 MED ORDER — MECLIZINE HCL 25 MG PO TABS
25.0000 mg | ORAL_TABLET | Freq: Three times a day (TID) | ORAL | Status: DC
  Administered 2022-02-10 – 2022-02-12 (×6): 25 mg via ORAL
  Filled 2022-02-10 (×7): qty 1

## 2022-02-10 MED ORDER — PANTOPRAZOLE SODIUM 40 MG IV SOLR
40.0000 mg | INTRAVENOUS | Status: DC
  Administered 2022-02-10: 40 mg via INTRAVENOUS
  Filled 2022-02-10: qty 10

## 2022-02-10 MED ORDER — GABAPENTIN 300 MG PO CAPS
300.0000 mg | ORAL_CAPSULE | Freq: Two times a day (BID) | ORAL | Status: DC
  Administered 2022-02-10 – 2022-02-12 (×5): 300 mg via ORAL
  Filled 2022-02-10 (×5): qty 1

## 2022-02-10 MED ORDER — GABAPENTIN 600 MG PO TABS
600.0000 mg | ORAL_TABLET | Freq: Every day | ORAL | Status: DC
  Administered 2022-02-10 – 2022-02-11 (×2): 600 mg via ORAL
  Filled 2022-02-10 (×2): qty 1

## 2022-02-10 MED ORDER — NITROGLYCERIN 0.4 MG SL SUBL
0.4000 mg | SUBLINGUAL_TABLET | SUBLINGUAL | Status: DC | PRN
Start: 2022-02-10 — End: 2022-02-12

## 2022-02-10 MED ORDER — EZETIMIBE 10 MG PO TABS
10.0000 mg | ORAL_TABLET | Freq: Every day | ORAL | Status: DC
  Administered 2022-02-10 – 2022-02-11 (×2): 10 mg via ORAL
  Filled 2022-02-10 (×2): qty 1

## 2022-02-10 MED ORDER — INSULIN ASPART 100 UNIT/ML IJ SOLN
0.0000 [IU] | Freq: Three times a day (TID) | INTRAMUSCULAR | Status: DC
  Administered 2022-02-11: 3 [IU] via SUBCUTANEOUS
  Administered 2022-02-11: 5 [IU] via SUBCUTANEOUS
  Administered 2022-02-12: 3 [IU] via SUBCUTANEOUS
  Administered 2022-02-12: 5 [IU] via SUBCUTANEOUS
  Filled 2022-02-10 (×3): qty 1

## 2022-02-10 MED ORDER — LINACLOTIDE 145 MCG PO CAPS
145.0000 ug | ORAL_CAPSULE | Freq: Every day | ORAL | Status: DC
Start: 2022-02-11 — End: 2022-02-12
  Filled 2022-02-10 (×2): qty 1

## 2022-02-10 MED ORDER — DIAZEPAM 5 MG/ML IJ SOLN
2.5000 mg | Freq: Once | INTRAMUSCULAR | Status: AC
  Administered 2022-02-10: 2.5 mg via INTRAVENOUS
  Filled 2022-02-10: qty 2

## 2022-02-10 MED ORDER — ONDANSETRON HCL 4 MG/2ML IJ SOLN
4.0000 mg | Freq: Four times a day (QID) | INTRAMUSCULAR | Status: DC | PRN
Start: 2022-02-10 — End: 2022-02-12
  Administered 2022-02-10: 4 mg via INTRAVENOUS
  Filled 2022-02-10: qty 2

## 2022-02-10 MED ORDER — SODIUM CHLORIDE 0.9 % IV SOLN: INTRAVENOUS | Status: DC

## 2022-02-10 MED ORDER — FENOFIBRATE 54 MG PO TABS
54.0000 mg | ORAL_TABLET | Freq: Every day | ORAL | Status: DC
  Administered 2022-02-11 – 2022-02-12 (×2): 54 mg via ORAL
  Filled 2022-02-10 (×2): qty 1

## 2022-02-10 MED ORDER — LORATADINE 10 MG PO TABS
10.0000 mg | ORAL_TABLET | Freq: Every day | ORAL | Status: DC
  Administered 2022-02-10 – 2022-02-12 (×3): 10 mg via ORAL
  Filled 2022-02-10 (×3): qty 1

## 2022-02-10 MED ORDER — ATORVASTATIN CALCIUM 20 MG PO TABS
80.0000 mg | ORAL_TABLET | Freq: Every day | ORAL | Status: DC
  Administered 2022-02-10 – 2022-02-11 (×2): 80 mg via ORAL
  Filled 2022-02-10 (×2): qty 4

## 2022-02-10 MED ORDER — QUETIAPINE FUMARATE 25 MG PO TABS
200.0000 mg | ORAL_TABLET | Freq: Every day | ORAL | Status: DC
  Administered 2022-02-10 – 2022-02-11 (×2): 200 mg via ORAL
  Filled 2022-02-10 (×2): qty 8

## 2022-02-10 MED ORDER — STROKE: EARLY STAGES OF RECOVERY BOOK: Freq: Once | Status: DC

## 2022-02-10 MED ORDER — ONDANSETRON 4 MG PO TBDP
4.0000 mg | ORAL_TABLET | Freq: Once | ORAL | Status: AC
  Administered 2022-02-10: 4 mg via ORAL
  Filled 2022-02-10: qty 1

## 2022-02-10 MED ORDER — CLOPIDOGREL BISULFATE 75 MG PO TABS
75.0000 mg | ORAL_TABLET | Freq: Every day | ORAL | Status: DC
  Administered 2022-02-10 – 2022-02-12 (×3): 75 mg via ORAL
  Filled 2022-02-10 (×3): qty 1

## 2022-02-10 MED ORDER — LEVOTHYROXINE SODIUM 100 MCG PO TABS
100.0000 ug | ORAL_TABLET | Freq: Every day | ORAL | Status: DC
  Administered 2022-02-11 – 2022-02-12 (×2): 100 ug via ORAL
  Filled 2022-02-10 (×2): qty 1

## 2022-02-10 MED ORDER — QUETIAPINE FUMARATE 300 MG PO TABS
300.0000 mg | ORAL_TABLET | Freq: Every day | ORAL | Status: DC
  Administered 2022-02-10 – 2022-02-11 (×2): 300 mg via ORAL
  Filled 2022-02-10 (×2): qty 1

## 2022-02-10 NOTE — Assessment & Plan Note (Signed)
Stable  Continue BuSpar, Celexa and Seroquel unless adjusted by neurology

## 2022-02-10 NOTE — Assessment & Plan Note (Signed)
Secondary to TIA versus CVA This is patient's second episode this week associated with unsteady gait, nausea and vomiting Initial CT scan of the head and CT angiogram are negative for an acute bleed and did not show any evidence of an LVO Patient will likely repeat CT scan of the head without contrast in 24 hours to rule out an acute stroke but will defer to neurology as she cannot have an MRI of the brain Continue Plavix, atorvastatin and aspirin Allow for permissive hypertension until acute stroke is ruled out We will request PT/OT/ST consult Consult neurology Place patient on scheduled meclizine, will add benzodiazepines if symptoms do not improve with meclizine

## 2022-02-10 NOTE — Assessment & Plan Note (Signed)
Stable Continue Synthroid 

## 2022-02-10 NOTE — Progress Notes (Signed)
Report given to [employer], bath given to patient before transfer to 1C.

## 2022-02-10 NOTE — ED Provider Notes (Signed)
[facility] Provider Note    Event Date/Time   First MD Initiated Contact with Patient 02/10/22 1202     (approximate)   History   Weakness and Emesis   HPI  [name] is a 56 y.o. female with past medical history of hypertension, hyperlipidemia, diabetes, COPD, asthma, bipolar disorder, peripheral vascular disease, here with dizziness and loss of balance.  The patient states that over the last 24 hours, she has had persistent, severe, dizziness.  She is been unable to keep anything down.  She is been unable to ambulate.  She is noticed some associated difficulty speaking as well and has had slurred speech.  She has felt off balance.  She states she was just seen actually in the ED several days ago and was diagnosed with possible gastroenteritis for her nausea vomiting.  She states that it was more so the dizziness then.  It did resolve.  It then returned today.  Denies any actual abdominal pain.  She did have a loose stool, but states this could be because she really was not eating much.  No recent travel.  No recent head trauma.  No vision changes.     Physical Exam   Triage Vital Signs: ED Triage Vitals  Enc Vitals Group     BP 02/10/22 1123 110/78     Pulse Rate 02/10/22 1123 66     Resp 02/10/22 1123 18     Temp 02/10/22 1123 97.9 F (36.6 C)     Temp Source 02/10/22 1123 Oral     SpO2 02/10/22 1123 93 %     Weight 02/10/22 1124 188 lb 15 oz (85.7 kg)     Height 02/10/22 1124 4' 11"  (1.499 m)     Head Circumference --      Peak Flow --      Pain Score 02/10/22 1124 0     Pain Loc --      Pain Edu? --      Excl. in [place]? --     Most recent vital signs: Vitals:   02/10/22 1826 02/10/22 1958  BP: (!) 120/58 122/65  Pulse: 80 78  Resp:  16  Temp: 98.6 F (37 C) 98.1 F (36.7 C)  SpO2: 95% 93%     General: Awake, no distress.  CV:  Good peripheral perfusion.  Regular rate and rhythm. Resp:  Normal effort.  Lungs clear Abd:  No  distention.  No tenderness.  No guarding or rebound. Other:  Bidirectional horizontal nystagmus noted.  Face is symmetric.  Gait deferred due to ataxia.  Mild dysmetria noted.  Strength out of 5 bilateral upper and lower extremities.  Normal sensation light touch.   ED Results / Procedures / Treatments   Labs (all labs ordered are listed, but only abnormal results are displayed) Labs Reviewed  BASIC METABOLIC PANEL - Abnormal; Notable for the following components:      Result Value   Glucose, Bld 252 (*)    Calcium 8.6 (*)    All other components within normal limits  CBC - Abnormal; Notable for the following components:   RBC 3.84 (*)    Hemoglobin 11.4 (*)    All other components within normal limits  URINALYSIS, ROUTINE W REFLEX MICROSCOPIC - Abnormal; Notable for the following components:   Color, Urine YELLOW (*)    APPearance HAZY (*)    Glucose, UA >=500 (*)    Leukocytes,Ua TRACE (*)    Bacteria, UA  MANY (*)    Non Squamous Epithelial PRESENT (*)    All other components within normal limits  GLUCOSE, CAPILLARY - Abnormal; Notable for the following components:   Glucose-Capillary 143 (*)    All other components within normal limits  URINE CULTURE  HIV ANTIBODY (ROUTINE TESTING W REFLEX)  LIPID PANEL  POC URINE PREG, ED  CBG MONITORING, ED     EKG Normal sinus rhythm, ventricular rate 67.  PR 194, QRS 92, QTc 429.  No acute ST elevations or depressions.  No EKG evidence of acute ischemia or infarct.   RADIOLOGY CT Angio Head/Neck: no acute abnormality, no large vessel occlusion   I also independently reviewed and agree with radiologist interpretations.   PROCEDURES:  Critical Care performed: No  .1-3 Lead EKG Interpretation  Performed by: Duffy Bruce, MD Authorized by: Duffy Bruce, MD     Interpretation: normal     ECG rate:  70-90   ECG rate assessment: normal     Rhythm: sinus rhythm     Ectopy: none     Conduction: normal   Comments:      Indication: Weakness, dizziness     MEDICATIONS ORDERED IN ED: Medications  meclizine (ANTIVERT) tablet 25 mg (25 mg Oral Given 02/10/22 1535)  metoCLOPramide (REGLAN) injection 10 mg (10 mg Intravenous Given 02/10/22 1532)  SUMAtriptan (IMITREX) tablet 100 mg (has no administration in time range)  Ubrogepant TABS 1 tablet (has no administration in time range)  atorvastatin (LIPITOR) tablet 80 mg (has no administration in time range)  ezetimibe (ZETIA) tablet 10 mg (has no administration in time range)  fenofibrate tablet 54 mg (has no administration in time range)  nitroGLYCERIN (NITROSTAT) SL tablet 0.4 mg (has no administration in time range)  busPIRone (BUSPAR) tablet 30 mg (has no administration in time range)  citalopram (CELEXA) tablet 20 mg (20 mg Oral Given 02/10/22 1654)  QUEtiapine (SEROQUEL) tablet 200 mg (has no administration in time range)  QUEtiapine (SEROQUEL) tablet 300 mg (has no administration in time range)  levothyroxine (SYNTHROID) tablet 100 mcg (has no administration in time range)  linaclotide (LINZESS) capsule 145 mcg (has no administration in time range)  clopidogrel (PLAVIX) tablet 75 mg (75 mg Oral Given 02/10/22 1654)  gabapentin (NEURONTIN) capsule 300 mg (300 mg Oral Given 02/10/22 1654)  gabapentin (NEURONTIN) tablet 600 mg (has no administration in time range)  lamoTRIgine (LAMICTAL) tablet 200 mg (200 mg Oral Given 02/10/22 1653)  loratadine (CLARITIN) tablet 10 mg (10 mg Oral Given 02/10/22 1654)   stroke: early stages of recovery book (has no administration in time range)  0.9 %  sodium chloride infusion ( Intravenous New Bag/Given 02/10/22 1852)  pantoprazole (PROTONIX) injection 40 mg (40 mg Intravenous Given 02/10/22 1659)  insulin aspart (novoLOG) injection 0-15 Units (has no administration in time range)  ondansetron (ZOFRAN) injection 4 mg (4 mg Intravenous Given 02/10/22 1855)  ondansetron (ZOFRAN-ODT) disintegrating tablet 4 mg (4 mg Oral Given 02/10/22 1127)   diazepam (VALIUM) injection 2.5 mg (2.5 mg Intravenous Given 02/10/22 1330)  sodium chloride 0.9 % bolus 1,000 mL (0 mLs Intravenous Stopped 02/10/22 1536)  iohexol (OMNIPAQUE) 350 MG/ML injection 75 mL (75 mLs Intravenous Contrast Given 02/10/22 1433)  fosfomycin (MONUROL) packet 3 g (3 g Oral Given 02/10/22 1500)     IMPRESSION / MDM / ASSESSMENT AND PLAN / ED COURSE  I reviewed the triage vital signs and the nursing notes.  The patient is on the cardiac monitor to evaluate for evidence of arrhythmia and/or significant heart rate changes.   Ddx:  Differential includes the following, with pertinent life- or limb-threatening emergencies considered:  Vertigo, peripheral vs central, CVA, TIA, AKI/metabolic abnormality, recrudescence of old CVA in setting of UTI/infection  Patient's presentation is most consistent with acute presentation with potential threat to life or bodily function.  MDM:  56 yo F with h/o HTN, DM, TIA, PVD, here with dizziness, vertigo, weakness. Pt was just seen for n/v and dizziness, d/c with supportive care after reassuring work-up. On exam here, suspect most of her n/v is from acute vertigo. H/o same though has also had TIAs with similar presentation. No other focal deficits noted. Unable to obtain MRI. CT Angio obtained, reviewed, and is overall unremarkable. Labs show possible UTI, though no sx on review. No leukocytosis or anemia. Renal function is normal. Will treat for vertigo, and admit for possible TIA evaluation. Dr. Quinn Axe with Neurology consulted via telephone. Outside of tPA window in event of CVA.   MEDICATIONS GIVEN IN ED: Medications  meclizine (ANTIVERT) tablet 25 mg (25 mg Oral Given 02/10/22 1535)  metoCLOPramide (REGLAN) injection 10 mg (10 mg Intravenous Given 02/10/22 1532)  SUMAtriptan (IMITREX) tablet 100 mg (has no administration in time range)  Ubrogepant TABS 1 tablet (has no administration in time range)  atorvastatin  (LIPITOR) tablet 80 mg (has no administration in time range)  ezetimibe (ZETIA) tablet 10 mg (has no administration in time range)  fenofibrate tablet 54 mg (has no administration in time range)  nitroGLYCERIN (NITROSTAT) SL tablet 0.4 mg (has no administration in time range)  busPIRone (BUSPAR) tablet 30 mg (has no administration in time range)  citalopram (CELEXA) tablet 20 mg (20 mg Oral Given 02/10/22 1654)  QUEtiapine (SEROQUEL) tablet 200 mg (has no administration in time range)  QUEtiapine (SEROQUEL) tablet 300 mg (has no administration in time range)  levothyroxine (SYNTHROID) tablet 100 mcg (has no administration in time range)  linaclotide (LINZESS) capsule 145 mcg (has no administration in time range)  clopidogrel (PLAVIX) tablet 75 mg (75 mg Oral Given 02/10/22 1654)  gabapentin (NEURONTIN) capsule 300 mg (300 mg Oral Given 02/10/22 1654)  gabapentin (NEURONTIN) tablet 600 mg (has no administration in time range)  lamoTRIgine (LAMICTAL) tablet 200 mg (200 mg Oral Given 02/10/22 1653)  loratadine (CLARITIN) tablet 10 mg (10 mg Oral Given 02/10/22 1654)   stroke: early stages of recovery book (has no administration in time range)  0.9 %  sodium chloride infusion ( Intravenous New Bag/Given 02/10/22 1852)  pantoprazole (PROTONIX) injection 40 mg (40 mg Intravenous Given 02/10/22 1659)  insulin aspart (novoLOG) injection 0-15 Units (has no administration in time range)  ondansetron (ZOFRAN) injection 4 mg (4 mg Intravenous Given 02/10/22 1855)  ondansetron (ZOFRAN-ODT) disintegrating tablet 4 mg (4 mg Oral Given 02/10/22 1127)  diazepam (VALIUM) injection 2.5 mg (2.5 mg Intravenous Given 02/10/22 1330)  sodium chloride 0.9 % bolus 1,000 mL (0 mLs Intravenous Stopped 02/10/22 1536)  iohexol (OMNIPAQUE) 350 MG/ML injection 75 mL (75 mLs Intravenous Contrast Given 02/10/22 1433)  fosfomycin (MONUROL) packet 3 g (3 g Oral Given 02/10/22 1500)     Consults:  Hospitalist consulted for admission   EMR  reviewed  Reviewed prior admission notes for TIA with somewhat similar sx     FINAL CLINICAL IMPRESSION(S) / ED DIAGNOSES   Final diagnoses:  Vertigo  Stroke-like symptoms     Rx / DC Orders  ED Discharge Orders     None        Note:  This document was prepared using Dragon voice recognition software and may include unintentional dictation errors.   Duffy Bruce, MD 02/10/22 2239

## 2022-02-10 NOTE — Assessment & Plan Note (Addendum)
Stable  Continue atorvastatin, Plavix and as needed nitrates  Hold metoprolol for now

## 2022-02-10 NOTE — ED Notes (Signed)
First Nurse Note: Pt to ED via [facility] from home for N/V and generalized weakness. Pt has been sick for a few days abut worse about 1 hour ago. Pt given 4 mg of Zofran IM. Pt has been seen for same and was discharged. VSS. Pt is A & O x 4. Pt is on O2 at night. Not currently on O2 as sats were 94%. CBG 217.

## 2022-02-10 NOTE — Assessment & Plan Note (Signed)
Patient has type 2 diabetes mellitus with complications of stage III chronic kidney disease  Glycemic control with sliding scale insulin  Accu-Cheks every 4 hours  Will treat patient with IV Reglan for possible diabetic gastroparesis  Judicious IV fluid hydration while patient is n.p.o.

## 2022-02-10 NOTE — ED Triage Notes (Signed)
Pt here with N/V that started 2 hours ago. Pt states she has been dealing with this for a while but it is progressively getting worst. Pt denies pain.

## 2022-02-10 NOTE — H&P (Signed)
History and Physical    Patient: [name] YWV:[id number] DOB: 1965/12/28 DOA: 02/10/2022 DOS: the patient was seen and examined on 02/10/2022 PCP: Steele Sizer, MD  Patient coming from: Home  Chief Complaint:  Chief Complaint  Patient presents with   Weakness   Emesis    History is obtained from patient's daughters at the bedside. HPI: [name] is a 56 y.o. female with medical history significant for morbid obesity, bipolar disorder, diabetes mellitus with complications of stage III chronic kidney disease, COPD, coronary artery disease , hypertension, obstructive sleep apnea, hypothyroidism who presents to the ER via EMS for evaluation of an unsteady gait, several falls at home, nausea, vomiting and dizziness. Per patient's daughter this is her second episode this week with dizziness, nausea and vomiting.  Initial episode resolved and then the night prior to her admission it recurred and patient complains of dizziness which has been persistent and severe associated with an unsteady gait that she describes as feeling off balance.  Her daughter states that she fell twice last night.  She states that she thinks her speech is slurred but she denies having any headache and she has no focal deficits.  She denies any double vision, no difficulty swallowing.  She was incontinent of stool on the day of admission. She denies having any fever, no chills, no abdominal pain, no urinary symptoms, no chest pain, no shortness of breath, no blurred vision no focal deficit. She has not been able to tolerate any oral intake due to nausea and vomiting. Review of Systems: As mentioned in the history of present illness. All other systems reviewed and are negative. Past Medical History:  Diagnosis Date   Anxiety    Arthritis    Asthma    Bipolar depression ([place])    CHF (congestive heart failure) (HCC)    Chronic headaches    migraines have lessened   Chronic kidney disease    ddstage 111   COPD  (chronic obstructive pulmonary disease) (HCC)    Coronary artery disease    Depression    Diabetic neuropathy (HCC)    Dyspnea    uses inhalers for this   Dysrhythmia    rapid   Fatty liver    GERD (gastroesophageal reflux disease)    Headache    Heart attack ([place]) 2009   Hyperlipidemia    Hypertension    Hypomagnesemia    Hypothyroid    Irregular heart rhythm    Liver lesion    Favored to be benign on CT; MRI of abd w/contract in Aug 2016. Liver Biopsy-Negative, March 2016   Morbid obesity ([place])    OSA (obstructive sleep apnea)    Peripheral vascular disease (HCC)    Pinched nerve    Back   Pneumonia    RLS (restless legs syndrome)    Seasonal allergies    Sleep apnea    uses oxygen at night 2L d/t cpap did not fit   Type 2 diabetes, uncontrolled, with neuropathy 10/15/2015   Overview:  Microalbumin 7.7 12/2010: Hgb A1c 10.2% 12/2010 and 9.7% 03/2011, eye exam 10/2009 [facility]    Urinary incontinence    Vitamin D deficiency    Past Surgical History:  Procedure Laterality Date   ABDOMINAL HYSTERECTOMY     Partial   bladder stim  2007   BLADDER SURGERY     x 2. tacking of bladder and the other was the stimulator placement   CHOLECYSTECTOMY     COLONOSCOPY  WITH PROPOFOL N/A 03/04/2019   Procedure: COLONOSCOPY WITH PROPOFOL;  Surgeon: [name], MD;  Location: [facility] ENDOSCOPY;  Service: Endoscopy;  Laterality: N/A;   ESOPHAGOGASTRODUODENOSCOPY (EGD) WITH PROPOFOL N/A 03/04/2019   Procedure: ESOPHAGOGASTRODUODENOSCOPY (EGD) WITH PROPOFOL;  Surgeon: [name], MD;  Location: [facility] ENDOSCOPY;  Service: Endoscopy;  Laterality: N/A;   INCISION AND DRAINAGE ABSCESS Right 04/16/2019   Procedure: INCISION AND DRAINAGE ABSCESS;  Surgeon: Benjamine Sprague, DO;  Location: [facility] ORS;  Service: General;  Laterality: Right;   INTERSTIM-GENERATOR CHANGE N/A 12/31/2017   Procedure: INTERSTIM-GENERATOR CHANGE;  Surgeon: Bjorn Loser, MD;  Location: [facility] ORS;  Service: Urology;   Laterality: N/A;   LIVER BIOPSY  March 2016   Negative   LUNG BIOPSY  2015   small spot on lung   TUBAL LIGATION     UPPER GI ENDOSCOPY     WRIST SURGERY Right 11/02/2019   Social History:  reports that she has never smoked. She has never used smokeless tobacco. She reports that she does not drink alcohol and does not use drugs.  Allergies  Allergen Reactions   Mirtazapine Anaphylaxis    REACTION: closes throat   Morphine And Related     anaphylaxis   Sulfamethoxazole-Trimethoprim Rash    REACTION: Whelps all over   Vicodin Hp [Hydrocodone-Acetaminophen]    Amoxicillin Er Nausea And Vomiting    Pt doesn't remember problem with amoxicillin -  SE Has patient had a PCN reaction causing immediate rash, facial/tongue/throat swelling, SOB or lightheadedness with hypotension: No Has patient had a PCN reaction causing severe rash involving mucus membranes or skin necrosis: No Has patient had a PCN reaction that required hospitalization: No Has patient had a PCN reaction occurring within the last 10 years: Yes If all of the above answers are "NO", then may proceed with Cephalosporin use.   Codeine Itching    REACTION: itch, rash   Hydrocodone-Acetaminophen Rash    REACTION: rash   Keflex [Cephalexin] Rash    Pt does not remember having a reaction or rash, no airway involvement   Prasterone Nausea And Vomiting    Family History  Problem Relation Age of Onset   Diabetes Mother    Heart disease Mother    Hyperlipidemia Mother    Hypertension Mother    Kidney disease Mother    Mental illness Mother    Hypothyroidism Mother    Stroke Mother    Osteoporosis Mother    Glaucoma Mother    Congestive Heart Failure Mother    Hypertension Father    Kidney disease Brother    Intracerebral hemorrhage Brother    Breast cancer Maternal Grandmother 44   Heart disease Maternal Grandfather    Rheum arthritis Maternal Grandfather    Cancer Maternal Grandfather        liver   Kidney  disease Maternal Grandfather    Cancer Paternal Grandmother        lung   Asthma Daughter    Cancer Daughter        throat   Bipolar disorder Daughter    Bipolar disorder Daughter    Hypertension Daughter    Restless legs syndrome Daughter     Prior to Admission medications   Medication Sig Start Date End Date Taking? Authorizing Provider  atorvastatin (LIPITOR) 80 MG tablet Take 80 mg by mouth at bedtime.     [provider]  busPIRone (BUSPAR) 30 MG tablet Take 30 mg by mouth 2 (two) times daily. 11/09/20   [provider]  cetirizine (ZYRTEC) 10 MG tablet Take 1 tablet (10 mg total) by mouth daily. 11/03/21   Bo Merino, FNP  citalopram (CELEXA) 20 MG tablet Take 20 mg by mouth daily. 01/16/20   [facility], [facility]  clopidogrel (PLAVIX) 75 MG tablet Take 1 tablet (75 mg total) by mouth daily. 10/31/21 10/31/22  Merlyn Lot, MD  esomeprazole (NEXIUM) 40 MG capsule TAKE 1 CAPSULE BY MOUTH IN THE MORNING 02/08/22   [name], MD  ezetimibe (ZETIA) 10 MG tablet Take 10 mg by mouth at bedtime. 01/05/17   [provider]  fenofibrate micronized (LOFIBRA) 134 MG capsule Take 134 mg by mouth at bedtime.    Morayati, Lourdes Sledge, MD  fluticasone (FLONASE) 50 MCG/ACT nasal spray PLACE 2 SPRAYS INTO BOTH NOSTRILS DAILY 12/07/21   Steele Sizer, MD  gabapentin (NEURONTIN) 300 MG capsule Take 300 mg by mouth 2 (two) times daily.    Morayati, Lourdes Sledge, MD  gabapentin (NEURONTIN) 600 MG tablet Take 600 mg by mouth at bedtime. 06/15/20   Morayati, Lourdes Sledge, MD  GLYXAMBI 25-5 MG TABS Take 1 tablet by mouth daily. 10/01/20   Morayati, Lourdes Sledge, MD  HUMULIN R U-500 KWIKPEN 500 UNIT/ML kwikpen Inject 70 Units into the skin 3 (three) times daily with meals. 01/10/21   [provider]  Insulin Aspart FlexPen (NOVOLOG) 100 UNIT/ML Inject into the skin. 12/05/21   [provider]  Insulin Pen Needle (PEN NEEDLES) 31G X 8 MM MISC  09/26/21   [provider]   ketoconazole (NIZORAL) 2 % cream Apply 1 application. topically daily. 12/28/21   Steele Sizer, MD  lamoTRIgine (LAMICTAL) 200 MG tablet Take 200 mg by mouth 2 (two) times daily.     [provider]  linaclotide Rolan Lipa) 145 MCG CAPS capsule Take 1 capsule (145 mcg total) by mouth daily before breakfast. 01/31/22   [name], MD  lisinopril (ZESTRIL) 2.5 MG tablet Take 1 tablet (2.5 mg total) by mouth daily. 12/09/21   Steele Sizer, MD  metoprolol tartrate (LOPRESSOR) 25 MG tablet TAKE 1 TABLET BY MOUTH TWICE DAILY 01/10/22   Furth, Cadence H, PA-C  nitroGLYCERIN (NITROSTAT) 0.4 MG SL tablet Place 1 tablet (0.4 mg total) under the tongue every 5 (five) minutes as needed for chest pain. 10/13/20   Minna Merritts, MD  OneTouch Delica Lancets 08M MISC by Does not apply route.    [provider]  promethazine (PHENERGAN) 12.5 MG tablet Take 1 tablet (12.5 mg total) by mouth every 6 (six) hours as needed. 01/18/22   [name], MD  promethazine (PHENERGAN) 25 MG suppository Place 1 suppository (25 mg total) rectally every 6 (six) hours as needed for nausea or vomiting. 02/05/22   Nance Pear, MD  QUEtiapine (SEROQUEL) 200 MG tablet Take 200 mg by mouth at bedtime. Along with 369m dose for a total of 5066m   [facility]  QUEtiapine (SEROQUEL) 300 MG tablet Take 300 mg by mouth at bedtime. Along with 2006mose for a total of 500m94m22/22   [facility]  SUMAtriptan (IMITREX) 100 MG tablet TAKE 1 TABLET BY MOUTH ONCE FOR 1 DOSE. MAY REPEAT IN 2 HOURS IF HEADACHE PERSISTS OR RECURS. 12/26/21   SowlSteele Sizer  SYNTHROID 100 MCG tablet Take 100 mcg by mouth daily. 10/01/20   Morayati, ShamLourdes Sledge  TRULICITY 1.5 MG/0VH/8.4ONN Inject 1.5 mg into the skin once a week. (Wednesdays) 10/01/20   Morayati, ShamLourdes Sledge  MD  Ubrogepant (UBRELVY) 100 MG TABS Take 1 tablet by mouth every other day. 12/28/21   Steele Sizer, MD    Physical Exam: Vitals:    02/10/22 1123 02/10/22 1124 02/10/22 1432  BP: 110/78  118/76  Pulse: 66  70  Resp: 18  16  Temp: 97.9 F (36.6 C)    TempSrc: Oral    SpO2: 93%  95%  Weight:  85.7 kg   Height:  4' 11"  (1.499 m)    Physical Exam Vitals and nursing note reviewed.  Constitutional:      Appearance: She is obese.  HENT:     Head: Normocephalic and atraumatic.     Nose: Nose normal.     Mouth/Throat:     Mouth: Mucous membranes are dry.  Eyes:     Pupils: Pupils are equal, round, and reactive to light.  Cardiovascular:     Rate and Rhythm: Normal rate and regular rhythm.  Pulmonary:     Effort: Pulmonary effort is normal.     Breath sounds: Normal breath sounds.  Abdominal:     General: Bowel sounds are normal.     Palpations: Abdomen is soft.     Comments: Central adiposity  Musculoskeletal:        General: Normal range of motion.     Cervical back: Normal range of motion and neck supple.  Skin:    General: Skin is warm and dry.  Neurological:     Mental Status: She is alert and oriented to person, place, and time.     Motor: Weakness present.  Psychiatric:     Comments: Depressed mood, flat affect     Data Reviewed: Relevant notes from primary care and specialist visits, past discharge summaries as available in EHR, including Care Everywhere. Prior diagnostic testing as pertinent to current admission diagnoses Updated medications and problem lists for reconciliation ED course, including vitals, labs, imaging, treatment and response to treatment Triage notes, nursing and pharmacy notes and ED provider's notes Notable results as noted in HPI Labs reviewed.  Sodium 141, potassium 4.3, chloride 109, bicarb 25, glucose 252, BUN 14, creatinine 0.98, calcium 8.6, white count 5.3, hemoglobin 11.4, hematocrit 36.8, platelet count 183 Urine analysis shows pyuria CT angio head and neck with and without contrast showed no evidence of acute intracranial abnormality. No large vessel occlusion or  significant proximal stenosis in the head or neck. Twelve-lead EKG reviewed by me shows normal sinus rhythm with nonspecific T wave changes There are no new results to review at this time.  Assessment and Plan: * Vertigo Secondary to TIA versus CVA This is patient's second episode this week associated with unsteady gait, nausea and vomiting Initial CT scan of the head and CT angiogram are negative for an acute bleed and did not show any evidence of an LVO Patient will likely repeat CT scan of the head without contrast in 24 hours to rule out an acute stroke but will defer to neurology as she cannot have an MRI of the brain Continue Plavix, atorvastatin and aspirin Allow for permissive hypertension until acute stroke is ruled out We will request PT/OT/ST consult Consult neurology Place patient on scheduled meclizine, will add benzodiazepines if symptoms do not improve with meclizine  CKD stage 3 due to type 2 diabetes mellitus ([place]) Patient has type 2 diabetes mellitus with complications of stage III chronic kidney disease Glycemic control with sliding scale insulin Accu-Cheks every 4 hours Will treat patient with IV Reglan for possible  diabetic gastroparesis Judicious IV fluid hydration while patient is n.p.o.  Coronary artery disease of native artery of native heart with stable angina pectoris (HCC) Stable Continue atorvastatin, Plavix and as needed nitrates Hold metoprolol for now  HTN (hypertension) We will allow for permissive hypertension until an acute stroke is ruled out Hold lisinopril and Metoprolol for now  Hypothyroid Stable Continue Synthroid  Obesity (BMI 35.0-39.9 without comorbidity) Patient has a BMI of 93.81 kg/m2 Complicates overall prognosis and care Lifestyle modification and exercise has been discussed with patient in detail  Schizoaffective disorder, bipolar type (HCC) Stable Continue BuSpar, Celexa and Seroquel  UTI (urinary tract  infection) Patient noted to have pyuria and received a dose of fosfomycin Follow-up results of urine culture      Advance Care Planning:   Code Status: Full Code   Consults: Neurology  Family Communication: Greater than 50% of time was spent discussing patient's condition and plan of care with her daughters at the bedside.  All questions and concerns have been addressed.  They verbalized understanding and agree with the plan.  Severity of Illness: The appropriate patient status for this patient is INPATIENT. Inpatient status is judged to be reasonable and necessary in order to provide the required intensity of service to ensure the patient's safety. [name] presenting symptoms, physical exam findings, and initial radiographic and laboratory data in the context of their chronic comorbidities is felt to place them at high risk for further clinical deterioration. Furthermore, it is not anticipated that the patient will be medically stable for discharge from the hospital within 2 midnights of admission.   * I certify that at the point of admission it is my clinical judgment that the patient will require inpatient hospital care spanning beyond 2 midnights from the point of admission due to high intensity of service, high risk for further deterioration and high frequency of surveillance required.*  Author: Collier Bullock, MD 02/10/2022 3:47 PM  For on call review [email or web address].

## 2022-02-10 NOTE — Assessment & Plan Note (Signed)
   We will allow for permissive hypertension until an acute stroke is ruled out  Hold lisinopril and Metoprolol for now

## 2022-02-10 NOTE — Assessment & Plan Note (Signed)
Patient noted to have pyuria and received a dose of fosfomycin [place] (+)epithelial cells, trace leuks, 11-20 WBC  Follow-up results of urine culture

## 2022-02-10 NOTE — Assessment & Plan Note (Signed)
Patient has a BMI of 64.84 kg/m2 Complicates overall prognosis and care  Lifestyle modification and exercise has been discussed with patient

## 2022-02-11 ENCOUNTER — Inpatient Hospital Stay: Payer: Medicare Other

## 2022-02-11 DIAGNOSIS — E669 Obesity, unspecified: Secondary | ICD-10-CM | POA: Diagnosis not present

## 2022-02-11 DIAGNOSIS — K760 Fatty (change of) liver, not elsewhere classified: Secondary | ICD-10-CM | POA: Diagnosis not present

## 2022-02-11 DIAGNOSIS — G238 Other specified degenerative diseases of basal ganglia: Secondary | ICD-10-CM | POA: Diagnosis not present

## 2022-02-11 DIAGNOSIS — E1122 Type 2 diabetes mellitus with diabetic chronic kidney disease: Secondary | ICD-10-CM | POA: Diagnosis not present

## 2022-02-11 DIAGNOSIS — I25118 Atherosclerotic heart disease of native coronary artery with other forms of angina pectoris: Secondary | ICD-10-CM | POA: Diagnosis not present

## 2022-02-11 DIAGNOSIS — N183 Chronic kidney disease, stage 3 unspecified: Secondary | ICD-10-CM | POA: Diagnosis not present

## 2022-02-11 DIAGNOSIS — E1151 Type 2 diabetes mellitus with diabetic peripheral angiopathy without gangrene: Secondary | ICD-10-CM | POA: Diagnosis not present

## 2022-02-11 DIAGNOSIS — R42 Dizziness and giddiness: Secondary | ICD-10-CM | POA: Diagnosis not present

## 2022-02-11 DIAGNOSIS — R29818 Other symptoms and signs involving the nervous system: Secondary | ICD-10-CM | POA: Diagnosis not present

## 2022-02-11 DIAGNOSIS — E1140 Type 2 diabetes mellitus with diabetic neuropathy, unspecified: Secondary | ICD-10-CM | POA: Diagnosis not present

## 2022-02-11 DIAGNOSIS — Z6838 Body mass index (BMI) 38.0-38.9, adult: Secondary | ICD-10-CM | POA: Diagnosis not present

## 2022-02-11 DIAGNOSIS — J449 Chronic obstructive pulmonary disease, unspecified: Secondary | ICD-10-CM | POA: Diagnosis not present

## 2022-02-11 DIAGNOSIS — G2581 Restless legs syndrome: Secondary | ICD-10-CM | POA: Diagnosis not present

## 2022-02-11 DIAGNOSIS — G9389 Other specified disorders of brain: Secondary | ICD-10-CM | POA: Diagnosis not present

## 2022-02-11 DIAGNOSIS — E039 Hypothyroidism, unspecified: Secondary | ICD-10-CM | POA: Diagnosis not present

## 2022-02-11 DIAGNOSIS — I129 Hypertensive chronic kidney disease with stage 1 through stage 4 chronic kidney disease, or unspecified chronic kidney disease: Secondary | ICD-10-CM | POA: Diagnosis not present

## 2022-02-11 DIAGNOSIS — Z880 Allergy status to penicillin: Secondary | ICD-10-CM | POA: Diagnosis not present

## 2022-02-11 DIAGNOSIS — F250 Schizoaffective disorder, bipolar type: Secondary | ICD-10-CM | POA: Diagnosis not present

## 2022-02-11 DIAGNOSIS — Z885 Allergy status to narcotic agent status: Secondary | ICD-10-CM | POA: Diagnosis not present

## 2022-02-11 LAB — LIPID PANEL
Cholesterol: 67 mg/dL (ref 0–200)
HDL: 21 mg/dL — ABNORMAL LOW (ref >40)
LDL Cholesterol: 17 mg/dL (ref 0–99)
Total CHOL/HDL Ratio: 3.2 RATIO
Triglycerides: 146 mg/dL (ref <150)
VLDL: 29 mg/dL (ref 0–40)

## 2022-02-11 LAB — GLUCOSE, CAPILLARY
Glucose-Capillary: 116 mg/dL — ABNORMAL HIGH (ref 70–99)
Glucose-Capillary: 199 mg/dL — ABNORMAL HIGH (ref 70–99)
Glucose-Capillary: 240 mg/dL — ABNORMAL HIGH (ref 70–99)

## 2022-02-11 MED ORDER — ASPIRIN 81 MG PO CHEW
81.0000 mg | CHEWABLE_TABLET | Freq: Every day | ORAL | Status: DC
  Administered 2022-02-11 – 2022-02-12 (×2): 81 mg via ORAL
  Filled 2022-02-11 (×2): qty 1

## 2022-02-11 MED ORDER — PANTOPRAZOLE SODIUM 40 MG PO TBEC
40.0000 mg | DELAYED_RELEASE_TABLET | Freq: Every day | ORAL | Status: DC
  Administered 2022-02-11 – 2022-02-12 (×2): 40 mg via ORAL
  Filled 2022-02-11 (×2): qty 1

## 2022-02-11 MED ORDER — MECLIZINE HCL 25 MG PO TABS
25.0000 mg | ORAL_TABLET | Freq: Two times a day (BID) | ORAL | Status: DC | PRN
  Administered 2022-02-11: 25 mg via ORAL
  Filled 2022-02-11 (×2): qty 1

## 2022-02-11 MED ORDER — ALUM & MAG HYDROXIDE-SIMETH 200-200-20 MG/5ML PO SUSP
30.0000 mL | ORAL | Status: DC | PRN
  Administered 2022-02-11 – 2022-02-12 (×2): 30 mL via ORAL
  Filled 2022-02-11 (×2): qty 30

## 2022-02-11 NOTE — Progress Notes (Signed)
PHARMACIST - PHYSICIAN COMMUNICATION  DR:   Sheppard Coil  CONCERNING: IV to Oral Route Change Policy  RECOMMENDATION: This patient is receiving pantoprazole by the intravenous route.  Based on criteria approved by the Pharmacy and Therapeutics Committee, the intravenous medication(s) is/are being converted to the equivalent oral dose form(s).   DESCRIPTION: These criteria include: The patient is eating (either orally or via tube) and/or has been taking other orally administered medications for a least 24 hours The patient has no evidence of active gastrointestinal bleeding or impaired GI absorption (gastrectomy, short bowel, patient on TNA or NPO).  If you have questions about this conversion, please contact the Pharmacy Department  []   [phone] )  [name] [x]   [phone] )  [facility] []   [phone] )  [name] []   [phone] )  [facility] []   [phone] )  [place], [facility] 02/11/2022 1:13 PM

## 2022-02-11 NOTE — Consult Note (Signed)
NEUROLOGY CONSULTATION NOTE   Date of service: February 11, 2022 Patient Name: [name] MRN:  [id number] DOB:  01/16/1966 Reason for consult: vertigo Requesting physician: Dr. Emeterio Reeve _ _ _   _ __   _ __ _ _  __ __   _ __   __ _  History of Present Illness   This is a 56 yo woman with hx significant for bipolar disorder, DM2, CKD stage III, COPD, CAD, HTN, OSA, hypothyroidism who is admitted for second episode of vertigo in one week. First episode occurred several days before admission, was acute in onset and assoc w/ n/v and resolved within hours without intervention. The night prior to admission vertigo and n/v recurred and persisted till the next day prompting her to come to ED for evaluation. She fell twice the night prior to admission 2/2 new imbalance and unsteady gait. She was admitted for stroke workup and PT evaluation. She still has vertigo but it is improved since admission. It is slightly worse with change in head position but has been persistent without any periods of resolution. She was unable to get MRI brain 2/2 spinal cord stimulator. Head CT on admission NAICP. CTA H&N unremarkable. CNS imaging personally reviewed. TTE pending.  Stroke Labs     Component Value Date/Time   CHOL 67 02/11/2022 0642   CHOL 196 09/22/2015 1339   CHOL 141 10/25/2013 0314   TRIG 146 02/11/2022 0642   TRIG 830 (H) 10/25/2013 0314   HDL 21 (L) 02/11/2022 0642   HDL 35 (L) 09/22/2015 1339   HDL 9 (L) 10/25/2013 0314   CHOLHDL 3.2 02/11/2022 0642   VLDL 29 02/11/2022 0642   VLDL SEE COMMENT 10/25/2013 0314   LDLCALC 17 02/11/2022 0642   LDLCALC 99 09/22/2015 1339   LDLCALC SEE COMMENT 10/25/2013 0314   LDLDIRECT 65.4 06/08/2008 1456   LABVLDL 62 (H) 09/22/2015 1339    Lab Results  Component Value Date/Time   HGBA1C 8.3 (H) 10/05/2021 06:41 AM   HGBA1C 7.7 09/17/2021 12:00 AM      ROS   Per HPI: all other systems reviewed and are negative  Past History   I have reviewed  the following:  Past Medical History:  Diagnosis Date   Anxiety    Arthritis    Asthma    Bipolar depression ([place])    CHF (congestive heart failure) (HCC)    Chronic headaches    migraines have lessened   Chronic kidney disease    ddstage 111   COPD (chronic obstructive pulmonary disease) ([place])    Coronary artery disease    Depression    Diabetic neuropathy ([place])    Dyspnea    uses inhalers for this   Dysrhythmia    rapid   Fatty liver    GERD (gastroesophageal reflux disease)    Headache    Heart attack ([place]) 2009   Hyperlipidemia    Hypertension    Hypomagnesemia    Hypothyroid    Irregular heart rhythm    Liver lesion    Favored to be benign on CT; MRI of abd w/contract in Aug 2016. Liver Biopsy-Negative, March 2016   Morbid obesity ([place])    OSA (obstructive sleep apnea)    Peripheral vascular disease (HCC)    Pinched nerve    Back   Pneumonia    RLS (restless legs syndrome)    Seasonal allergies    Sleep apnea    uses oxygen at night 2L d/t cpap  did not fit   Type 2 diabetes, uncontrolled, with neuropathy 10/15/2015   Overview:  Microalbumin 7.7 12/2010: Hgb A1c 10.2% 12/2010 and 9.7% 03/2011, eye exam 10/2009 [facility]    Urinary incontinence    Vitamin D deficiency    Past Surgical History:  Procedure Laterality Date   ABDOMINAL HYSTERECTOMY     Partial   bladder stim  2007   BLADDER SURGERY     x 2. tacking of bladder and the other was the stimulator placement   CHOLECYSTECTOMY     COLONOSCOPY WITH PROPOFOL N/A 03/04/2019   Procedure: COLONOSCOPY WITH PROPOFOL;  Surgeon: [name], MD;  Location[facility] ENDOSCOPY;  Service: Endoscopy;  Laterality: N/A;   ESOPHAGOGASTRODUODENOSCOPY (EGD) WITH PROPOFOL N/A 03/04/2019   Procedure: ESOPHAGOGASTRODUODENOSCOPY (EGD) WITH PROPOFOL;  Surgeon: [name], MD;  Location: [facility] ENDOSCOPY;  Service: Endoscopy;  Laterality: N/A;   INCISION AND DRAINAGE ABSCESS Right 04/16/2019   Procedure: INCISION AND DRAINAGE  ABSCESS;  Surgeon: Benjamine Sprague, DO;  Location: [facility] ORS;  Service: General;  Laterality: Right;   INTERSTIM-GENERATOR CHANGE N/A 12/31/2017   Procedure: INTERSTIM-GENERATOR CHANGE;  Surgeon: Bjorn Loser, MD;  Location: [facility] ORS;  Service: Urology;  Laterality: N/A;   LIVER BIOPSY  March 2016   Negative   LUNG BIOPSY  2015   small spot on lung   TUBAL LIGATION     UPPER GI ENDOSCOPY     WRIST SURGERY Right 11/02/2019   Family History  Problem Relation Age of Onset   Diabetes Mother    Heart disease Mother    Hyperlipidemia Mother    Hypertension Mother    Kidney disease Mother    Mental illness Mother    Hypothyroidism Mother    Stroke Mother    Osteoporosis Mother    Glaucoma Mother    Congestive Heart Failure Mother    Hypertension Father    Kidney disease Brother    Intracerebral hemorrhage Brother    Breast cancer Maternal Grandmother 44   Heart disease Maternal Grandfather    Rheum arthritis Maternal Grandfather    Cancer Maternal Grandfather        liver   Kidney disease Maternal Grandfather    Cancer Paternal Grandmother        lung   Asthma Daughter    Cancer Daughter        throat   Bipolar disorder Daughter    Bipolar disorder Daughter    Hypertension Daughter    Restless legs syndrome Daughter    Social History   Socioeconomic History   Marital status: Married    Spouse name: Not on file   Number of children: 2   Years of education: 12   Highest education level: Not on file  Occupational History   Occupation: disabled  Tobacco Use   Smoking status: Never   Smokeless tobacco: Never  Vaping Use   Vaping Use: Never used  Substance and Sexual Activity   Alcohol use: No   Drug use: No   Sexual activity: Yes    Partners: Male    Birth control/protection: Surgical  Other Topics Concern   Not on file  Social History Narrative   Pt lives with husband and grandchildren   Social Determinants of Health   Financial Resource Strain: Low Risk   (02/02/2022)   Overall Financial Resource Strain (CARDIA)    Difficulty of Paying Living Expenses: Not very hard  Food Insecurity: Food Insecurity Present (02/02/2022)   Hunger Vital Sign  Worried About Charity fundraiser in the Last Year: Sometimes true    [employer] of Food in the Last Year: Never true  Transportation Needs: No Transportation Needs (02/02/2022)   PRAPARE - Hydrologist (Medical): No    Lack of Transportation (Non-Medical): No  Physical Activity: Inactive (02/02/2022)   Exercise Vital Sign    Days of Exercise per Week: 0 days    Minutes of Exercise per Session: 0 min  Stress: No Stress Concern Present (02/02/2022)   [place]    Feeling of Stress : Only a little  Social Connections: Moderately Isolated (02/02/2022)   Social Connection and Isolation Panel [NHANES]    Frequency of Communication with Friends and Family: More than three times a week    Frequency of Social Gatherings with Friends and Family: More than three times a week    Attends Religious Services: Never    Marine scientist or Organizations: No    Attends Music therapist: Never    Marital Status: Married   Allergies  Allergen Reactions   Mirtazapine Anaphylaxis    REACTION: closes throat   Morphine And Related     anaphylaxis   Sulfamethoxazole-Trimethoprim Rash    REACTION: Whelps all over   Vicodin Hp [Hydrocodone-Acetaminophen]    Amoxicillin Er Nausea And Vomiting    Pt doesn't remember problem with amoxicillin -  SE Has patient had a PCN reaction causing immediate rash, facial/tongue/throat swelling, SOB or lightheadedness with hypotension: No Has patient had a PCN reaction causing severe rash involving mucus membranes or skin necrosis: No Has patient had a PCN reaction that required hospitalization: No Has patient had a PCN reaction occurring within the last 10 years: Yes If all of the  above answers are "NO", then may proceed with Cephalosporin use.   Codeine Itching    REACTION: itch, rash   Hydrocodone-Acetaminophen Rash    REACTION: rash   Keflex [Cephalexin] Rash    Pt does not remember having a reaction or rash, no airway involvement   Prasterone Nausea And Vomiting    Medications   Medications Prior to Admission  Medication Sig Dispense Refill Last Dose   atorvastatin (LIPITOR) 80 MG tablet Take 80 mg by mouth at bedtime.    02/10/2022 at AM   busPIRone (BUSPAR) 30 MG tablet Take 30 mg by mouth 2 (two) times daily.   02/10/2022 at AM   cetirizine (ZYRTEC) 10 MG tablet Take 1 tablet (10 mg total) by mouth daily. 30 tablet 0 02/10/2022 at AM   citalopram (CELEXA) 20 MG tablet Take 20 mg by mouth daily.   02/10/2022 at AM   clopidogrel (PLAVIX) 75 MG tablet Take 1 tablet (75 mg total) by mouth daily. 30 tablet 11 02/10/2022 at AM   esomeprazole (NEXIUM) 40 MG capsule TAKE 1 CAPSULE BY MOUTH IN THE MORNING 30 capsule 9 02/10/2022 at AM   ezetimibe (ZETIA) 10 MG tablet Take 10 mg by mouth at bedtime.   02/09/2022 at PM   fenofibrate micronized (LOFIBRA) 134 MG capsule Take 134 mg by mouth at bedtime.   02/09/2022 at PM   fluticasone (FLONASE) 50 MCG/ACT nasal spray PLACE 2 SPRAYS INTO BOTH NOSTRILS DAILY 16 g 0 02/10/2022 at AM   gabapentin (NEURONTIN) 300 MG capsule Take 300 mg by mouth 2 (two) times daily.   02/10/2022 at AM   gabapentin (NEURONTIN) 600 MG tablet Take 600 mg  by mouth at bedtime.   02/09/2022 at PM   GLYXAMBI 25-5 MG TABS Take 1 tablet by mouth daily.   02/10/2022 at AM   HUMULIN R U-500 KWIKPEN 500 UNIT/ML kwikpen Inject 70 Units into the skin 3 (three) times daily with meals.   02/10/2022 at AM   ketoconazole (NIZORAL) 2 % cream Apply 1 application. topically daily. 60 g 1 02/10/2022 at AM   lamoTRIgine (LAMICTAL) 200 MG tablet Take 200 mg by mouth 2 (two) times daily.    02/10/2022 at AM   linaclotide (LINZESS) 145 MCG CAPS capsule Take 1 capsule (145 mcg total) by mouth daily  before breakfast. 30 capsule 5 02/10/2022 at AM   lisinopril (ZESTRIL) 2.5 MG tablet Take 1 tablet (2.5 mg total) by mouth daily. 90 tablet 0 02/10/2022 at AM   metoprolol tartrate (LOPRESSOR) 25 MG tablet TAKE 1 TABLET BY MOUTH TWICE DAILY 180 tablet 3 02/10/2022 at AM   promethazine (PHENERGAN) 12.5 MG tablet Take 1 tablet (12.5 mg total) by mouth every 6 (six) hours as needed. 30 tablet 1 02/10/2022 at AM   QUEtiapine (SEROQUEL) 200 MG tablet Take 200 mg by mouth at bedtime. Along with 31m dose for a total of 5033m  02/09/2022 at PM   QUEtiapine (SEROQUEL) 300 MG tablet Take 300 mg by mouth at bedtime. Along with 20025mose for a total of 500m28m6/04/2022 at PM   SUMAtriptan (IMITREX) 100 MG tablet TAKE 1 TABLET BY MOUTH ONCE FOR 1 DOSE. MAY REPEAT IN 2 HOURS IF HEADACHE PERSISTS OR RECURS. 10 tablet 0 Past Month at PRN   SYNTHROID 100 MCG tablet Take 100 mcg by mouth daily.   02/10/2022 at AM   Ubrogepant (UBRELVY) 100 MG TABS Take 1 tablet by mouth every other day. 16 tablet 5 02/10/2022 at AM   Insulin Aspart FlexPen (NOVOLOG) 100 UNIT/ML Inject into the skin.   PRN at PRN   Insulin Pen Needle (PEN NEEDLES) 31G X 8 MM MISC       nitroGLYCERIN (NITROSTAT) 0.4 MG SL tablet Place 1 tablet (0.4 mg total) under the tongue every 5 (five) minutes as needed for chest pain. 25 tablet 0 PRN at PRN   OneTouch Delica Lancets 30G 47SC by Does not apply route.      promethazine (PHENERGAN) 25 MG suppository Place 1 suppository (25 mg total) rectally every 6 (six) hours as needed for nausea or vomiting. 12 each 0    TRULICITY 1.5 MG/0JG/2.8ZMN Inject 1.5 mg into the skin once a week. (Wednesdays)   02/08/2022      Current Facility-Administered Medications:     stroke: early stages of recovery book, , Does not apply, Once, Agbata, Tochukwu, MD   aspirin chewable tablet 81 mg, 81 mg, Oral, Daily, StacDerek Jack, 81 mg at 02/11/22 1315   atorvastatin (LIPITOR) tablet 80 mg, 80 mg, Oral, QHS, Agbata, Tochukwu, MD,  80 mg at 02/10/22 2240   busPIRone (BUSPAR) tablet 30 mg, 30 mg, Oral, BID, Agbata, Tochukwu, MD, 30 mg at 02/11/22 0931   citalopram (CELEXA) tablet 20 mg, 20 mg, Oral, Daily, Agbata, Tochukwu, MD, 20 mg at 02/11/22 0931   clopidogrel (PLAVIX) tablet 75 mg, 75 mg, Oral, Daily, Agbata, Tochukwu, MD, 75 mg at 02/11/22 0931   ezetimibe (ZETIA) tablet 10 mg, 10 mg, Oral, QHS, Agbata, Tochukwu, MD, 10 mg at 02/10/22 2240   fenofibrate tablet 54 mg, 54 mg, Oral, Daily, Agbata, Tochukwu, MD, 54 mg at  02/11/22 0931   gabapentin (NEURONTIN) capsule 300 mg, 300 mg, Oral, BID, Agbata, Tochukwu, MD, 300 mg at 02/11/22 0931   gabapentin (NEURONTIN) tablet 600 mg, 600 mg, Oral, QHS, Agbata, Tochukwu, MD, 600 mg at 02/10/22 2239   insulin aspart (novoLOG) injection 0-15 Units, 0-15 Units, Subcutaneous, TID WC, Agbata, Tochukwu, MD, 5 Units at 02/11/22 1741   lamoTRIgine (LAMICTAL) tablet 200 mg, 200 mg, Oral, BID, Agbata, Tochukwu, MD, 200 mg at 02/11/22 0931   levothyroxine (SYNTHROID) tablet 100 mcg, 100 mcg, Oral, Daily, Agbata, Tochukwu, MD, 100 mcg at 02/11/22 1610   linaclotide (LINZESS) capsule 145 mcg, 145 mcg, Oral, QAC breakfast, Agbata, Tochukwu, MD   loratadine (CLARITIN) tablet 10 mg, 10 mg, Oral, Daily, Agbata, Tochukwu, MD, 10 mg at 02/11/22 0931   meclizine (ANTIVERT) tablet 25 mg, 25 mg, Oral, TID, Agbata, Tochukwu, MD, 25 mg at 02/11/22 1736   nitroGLYCERIN (NITROSTAT) SL tablet 0.4 mg, 0.4 mg, Sublingual, Q5 min PRN, Agbata, Tochukwu, MD   ondansetron (ZOFRAN) injection 4 mg, 4 mg, Intravenous, Q6H PRN, Agbata, Tochukwu, MD, 4 mg at 02/10/22 1855   pantoprazole (PROTONIX) EC tablet 40 mg, 40 mg, Oral, Daily, Dallie Piles, RPH, 40 mg at 02/11/22 1447   QUEtiapine (SEROQUEL) tablet 200 mg, 200 mg, Oral, QHS, Agbata, Tochukwu, MD, 200 mg at 02/10/22 2241   QUEtiapine (SEROQUEL) tablet 300 mg, 300 mg, Oral, QHS, Agbata, Tochukwu, MD, 300 mg at 02/10/22 2238   Ubrogepant TABS 1 tablet, 1  tablet, Oral, QODAY, Agbata, Tochukwu, MD  Vitals   Vitals:   02/11/22 0624 02/11/22 0831 02/11/22 1317 02/11/22 1616  BP: (!) 103/53 120/61 (!) 110/56 133/66  Pulse: 88 87 88 87  Resp: 18 20 18 18   Temp: 99.4 F (37.4 C) 98.3 F (36.8 C) 98.6 F (37 C) 98.6 F (37 C)  TempSrc:      SpO2: 97% 100% 94% 93%  Weight:      Height:         Body mass index is 37.36 kg/m.  Physical Exam   Physical Exam Gen: A&O x4, NAD HEENT: Atraumatic, normocephalic;mucous membranes moist; oropharynx clear, tongue without atrophy or fasciculations. Neck: Supple, trachea midline. Resp: CTAB, no w/r/r CV: RRR, no m/g/r; nml S1 and S2. 2+ symmetric peripheral pulses. Abd: soft/NT/ND; nabs x 4 quad Extrem: Nml bulk; no cyanosis, clubbing, or edema.  Neuro: *MS: A&O x4. Follows multi-step commands.  *Speech: fluid, nondysarthric, able to name and repeat *CN:    I: Deferred   II,III: PERRLA, VFF by confrontation, optic discs unable to be visualized 2/2 pupillary constriction   III,IV,VI: EOMI w/o nystagmus, no ptosis   V: Sensation intact from V1 to V3 to LT   VII: Eyelid closure was full.  Smile symmetric.   VIII: Hearing intact to voice   IX,X: Voice normal, palate elevates symmetrically    XI: SCM/trap 5/5 bilat   XII: Tongue protrudes midline, no atrophy or fasciculations   *Motor:   Normal bulk.  No tremor, rigidity or bradykinesia. No pronator drift.    Strength: Dlt Bic Tri WrE WrF FgS Gr HF KnF KnE PlF DoF    Left 5 5 5 5 5 5 5 5 5 5 5 5     Right 5 5 5 5 5 5 5 5 5 5 5 5     *Sensory: Intact to light touch, pinprick, temperature vibration throughout. Symmetric. Propioception intact bilat.  No double-simultaneous extinction.  *Coordination:  action tremor and dysmetria on FNF bilat  without frank ataxia *Reflexes:  2+ and symmetric throughout without clonus; toes down-going bilat *Gait: deferred  NIHSS = 0   Premorbid mRS = 1   Labs   CBC:  Recent Labs  Lab  02/05/22 2054 02/10/22 1125  WBC 9.2 5.3  NEUTROABS 7.7  --   HGB 13.2 11.4*  HCT 42.2 36.8  MCV 92.3 95.8  PLT 229 144    Basic Metabolic Panel:  Lab Results  Component Value Date   NA 141 02/10/2022   K 4.3 02/10/2022   CO2 25 02/10/2022   GLUCOSE 252 (H) 02/10/2022   BUN 14 02/10/2022   CREATININE 0.98 02/10/2022   CALCIUM 8.6 (L) 02/10/2022   GFRNONAA >60 02/10/2022   GFRAA 61 06/18/2021   Lipid Panel:  Lab Results  Component Value Date   LDLCALC 17 02/11/2022   HgbA1c:  Lab Results  Component Value Date   HGBA1C 8.3 (H) 10/05/2021   Urine Drug Screen:     Component Value Date/Time   LABOPIA NONE DETECTED 11/29/2020 1915   COCAINSCRNUR NONE DETECTED 11/29/2020 1915   LABBENZ NONE DETECTED 11/29/2020 1915   AMPHETMU NONE DETECTED 11/29/2020 1915   THCU NONE DETECTED 11/29/2020 1915   LABBARB NONE DETECTED 11/29/2020 1915    Alcohol Level     Component Value Date/Time   ETH <10 03/31/2020 1634     Impression   This is a 56 yo woman with hx significant for bipolar disorder, DM2, CKD stage III, COPD, CAD, HTN, OSA, hypothyroidism who is admitted for recurrent vertigo now persistent x2 days assoc with nausea, vomiting, and imbalance. Ddx includes central (2/2 ischemic stroke) vs peripheral (more likely vestibular neuronitis or labrynthitis than BPPV based on hx). She is unable to obtain MRI 2/2 spinal cord stimulator therefore will repeat head CT at 24 hrs. TTE pending. PT eval pending.  Recommendations   - 48 hrs from sx onset, goal normotension; avoid hypotension - Repeat head CT noncon at 24 hrs r/o interval ischemic change - TTE w/ bubble - Check A1c and LDL + add statin per guidelines - ASA 46m daily + plavix 735mdaily x21 days f/b plavix 7576maily monotherapy after that - q4 hr neuro checks - STAT head CT for any change in neuro exam - Tele - PT eval - Amb referral to neurology upon discharge    ______________________________________________________________________   Thank you for the opportunity to take part in the care of this patient. If you have any further questions, please contact the neurology consultation attending.  Signed,  ColSu MonksD [facility] [phone] 7pm- 7am, please page neurology on call as listed in [facility]

## 2022-02-11 NOTE — Evaluation (Signed)
Speech Language Pathology Evaluation Patient Details Name: [name] MRN: [id number] DOB: 06-23-1966 Today's Date: 02/11/2022 Time: 0820-0905 SLP Time Calculation (min) (ACUTE ONLY): 45 min  Problem List:  Patient Active Problem List   Diagnosis Date Noted   Vertigo 02/10/2022   UTI (urinary tract infection) 02/10/2022   TIA (transient ischemic attack) 10/04/2021   HTN (hypertension) 10/04/2021   Chronic kidney disease, stage 3a ([place]) 10/04/2021   Depression with anxiety 10/04/2021   COPD (chronic obstructive pulmonary disease) ([place]) 10/04/2021   CAD (coronary artery disease) 10/04/2021   Chronic diastolic CHF (congestive heart failure) ([place]) 10/04/2021   Type II diabetes mellitus with renal manifestations ([place]) 06/30/2021   Interstitial cystitis 06/30/2021   Atherosclerosis of aorta ([place]) 03/28/2021   Iron deficiency anemia due to chronic blood loss    Polyp of ascending colon    Vitamin B12 deficiency 08/08/2018   Patient is full code 10/30/2017   Coronary artery disease of native artery of native heart with stable angina pectoris ([place]) 10/09/2017   Plantar fascial fibromatosis 10/09/2017   Tendinitis of wrist 10/09/2017   Schizoaffective disorder, bipolar type ([place]) 09/28/2017   Adenomatous colon polyp 10/15/2015   Bipolar affective disorder ([place]) 10/15/2015   CN (constipation) 10/15/2015   HLD (hyperlipidemia) 10/15/2015   NASH (nonalcoholic steatohepatitis) 10/15/2015   Obesity (BMI 35.0-39.9 without comorbidity) 10/15/2015   Restless leg syndrome 10/15/2015   Low back pain with left-sided sciatica 08/18/2015   Apnea, sleep 05/05/2015   Heart valve disease 05/05/2015   CKD stage 3 due to type 2 diabetes mellitus ([place]) 03/15/2015   Migraine 03/12/2015   Chronic bronchitis ([place]) 01/20/2015   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[place]) 01/20/2015   GERD (gastroesophageal reflux disease) 01/20/2015   Hypothyroid    Vitamin D deficiency    Urinary incontinence    Liver lesion     Hypomagnesemia    Obstructive sleep apnea 10/20/2013   Allergic rhinitis 07/04/2007   Past Medical History:  Past Medical History:  Diagnosis Date   Anxiety    Arthritis    Asthma    Bipolar depression ([place])    CHF (congestive heart failure) ([place])    Chronic headaches    migraines have lessened   Chronic kidney disease    ddstage 111   COPD (chronic obstructive pulmonary disease) ([place])    Coronary artery disease    Depression    Diabetic neuropathy (HCC)    Dyspnea    uses inhalers for this   Dysrhythmia    rapid   Fatty liver    GERD (gastroesophageal reflux disease)    Headache    Heart attack ([place]) 2009   Hyperlipidemia    Hypertension    Hypomagnesemia    Hypothyroid    Irregular heart rhythm    Liver lesion    Favored to be benign on CT; MRI of abd w/contract in Aug 2016. Liver Biopsy-Negative, March 2016   Morbid obesity ([place])    OSA (obstructive sleep apnea)    Peripheral vascular disease (HCC)    Pinched nerve    Back   Pneumonia    RLS (restless legs syndrome)    Seasonal allergies    Sleep apnea    uses oxygen at night 2L d/t cpap did not fit   Type 2 diabetes, uncontrolled, with neuropathy 10/15/2015   Overview:  Microalbumin 7.7 12/2010: Hgb A1c 10.2% 12/2010 and 9.7% 03/2011, eye exam 10/2009 [facility] DM [facility]    Urinary incontinence    Vitamin D  deficiency    Past Surgical History:  Past Surgical History:  Procedure Laterality Date   ABDOMINAL HYSTERECTOMY     Partial   bladder stim  2007   BLADDER SURGERY     x 2. tacking of bladder and the other was the stimulator placement   CHOLECYSTECTOMY     COLONOSCOPY WITH PROPOFOL N/A 03/04/2019   Procedure: COLONOSCOPY WITH PROPOFOL;  Surgeon: [name], MD;  Location: [facility] ENDOSCOPY;  Service: Endoscopy;  Laterality: N/A;   ESOPHAGOGASTRODUODENOSCOPY (EGD) WITH PROPOFOL N/A 03/04/2019   Procedure: ESOPHAGOGASTRODUODENOSCOPY (EGD) WITH PROPOFOL;  Surgeon: [name], MD;  Location: [facility] ENDOSCOPY;   Service: Endoscopy;  Laterality: N/A;   INCISION AND DRAINAGE ABSCESS Right 04/16/2019   Procedure: INCISION AND DRAINAGE ABSCESS;  Surgeon: Benjamine Sprague, DO;  Location: [facility] ORS;  Service: General;  Laterality: Right;   INTERSTIM-GENERATOR CHANGE N/A 12/31/2017   Procedure: INTERSTIM-GENERATOR CHANGE;  Surgeon: Bjorn Loser, MD;  Location: [facility] ORS;  Service: Urology;  Laterality: N/A;   LIVER BIOPSY  March 2016   Negative   LUNG BIOPSY  2015   small spot on lung   TUBAL LIGATION     UPPER GI ENDOSCOPY     WRIST SURGERY Right 11/02/2019   HPI:  [name] is a 56 y.o. female who presented to ED on 02/10/22 with nausea, vomiting, aphasia, and severe dizzinesss recent ED visit for same on 02/05/22. Unable to have MRI; CT angio 02/11/22 showed no evidence of acute intracranial abnormality, no large vessel occlusion or significant proximal stenosis in the head or neck.  Past medical history includes schizoaffective/bipolar disorder, DM II, GERD, CAD, Migraines, TIA, HTN, COPD, CHF, NASH, hypothyroidism, and OSA.   Assessment / Plan / Recommendation Clinical Impression   Patient presents with mild cognitive-linguistic impairments which she reports are present at baseline. Patient reports slowed thinking, hesitation, and occasional wordfinding difficulty due to medications she takes for bipolar disorder. She reports acute exacerbation of aphasia present on admission has resolved. Oral motor examination reveals no focal oral motor deficits. Patient scored 10/10 on all subtests of the Western Aphasia Battery-revised Bedside form (spontaneous speech, fluency, auditory verbal comprehension, sequential commands, repetition, object naming). She scored 22/30 on the SLUMS assessment, with errors attributable to decreased working memory (problem solving, digit manipulation) and executive function (clock drawing). 27 and above is considered WNL, (21-26 consistent with mild neurocognitive deficits). Patient  reports she feels she is at her baseline with regard to communication. Suspect mild deficits on examination attributable to schizoaffective/bipolar disorder; no further skilled ST is indicated. SLP to s/o.     SLP Assessment  SLP Recommendation/Assessment: Patient does not need any further Speech Lanaguage Pathology Services SLP Visit Diagnosis: Cognitive communication deficit (R41.841);Aphasia (R47.01)    Recommendations for follow up therapy are one component of a multi-disciplinary discharge planning process, led by the attending physician.  Recommendations may be updated based on patient status, additional functional criteria and insurance authorization.    Follow Up Recommendations  No SLP follow up    Assistance Recommended at Discharge     Functional Status Assessment Patient has had a recent decline in their functional status and demonstrates the ability to make significant improvements in function in a reasonable and predictable amount of time.  Frequency and Duration           SLP Evaluation Cognition  Overall Cognitive Status:  (suspect baseline frontal lobe deficits given history of schizoaffective disorder) Arousal/Alertness: Awake/alert Orientation Level: Oriented X4 Attention: Sustained Sustained Attention:  Appears intact Memory: Impaired Memory Impairment: Other (comment) (working memory mildly impaired) Awareness: Appears intact Problem Solving: Impaired Problem Solving Impairment:  (slow processing) Executive Function: Writer: Impaired Organizing Impairment:  (difficulty with clock drawing) Safety/Judgment: Appears intact       Comprehension  Auditory Comprehension Overall Auditory Comprehension: Appears within functional limits for tasks assessed Yes/No Questions: Within Functional Limits Commands: Within Functional Limits Conversation:  (mod complex) Interfering Components: Working Haematologist: Within Function Limits Reading Comprehension Reading Status: Within funtional limits (functional: read and ordered from menu)    Expression Expression Primary Mode of Expression: Verbal Verbal Expression Overall Verbal Expression: Appears within functional limits for tasks assessed Initiation: No impairment Automatic Speech: Name;Social Response;Counting;Day of week;Month of year Level of Generative/Spontaneous Verbalization: Conversation Repetition: No impairment Naming: No impairment (10 vegetables in 60 seconds) Pragmatics: No impairment Other Verbal Expression Comments: occasional hesitation/anomia which pt reports is consistent with her baseline ("di-dig-dizzy", "cold drink/soft drink") Written Expression Dominant Hand: Right Written Expression: Within Functional Limits (writes name and address; mild spelling error in sentence-level narrative writing)   Oral / Motor  Oral Motor/Sensory Function Overall Oral Motor/Sensory Function: Within functional limits Motor Speech Overall Motor Speech: Appears within functional limits for tasks assessed ("stuttering" appears it may be related to occasional hesitation/wordfinding (2 instances over 45 minute period)) Respiration: Within functional limits Phonation: Normal Resonance: Within functional limits Articulation: Within functional limitis Intelligibility: Intelligible Interfering Components: Premorbid status Effective Techniques: Slow rate           Deneise Lever, MS, [employer] Speech-Language Pathologist [phone]  Aliene Altes 02/11/2022, 9:28 AM

## 2022-02-11 NOTE — Progress Notes (Signed)
PT Cancellation Note  Patient Details Name: [name] MRN: [id number] DOB: [birth year]   Cancelled Treatment:    Reason Eval/Treat Not Completed: Medical issues which prohibited therapy;Patient not medically ready (Pt back from 2nd CT, due for more dizziness medications. Pt did not tolerated standing  ealier this afternoon with OT, reports exacerbation of vertigo down at CT.) Ocular exam shoes nystagmus with fixed gaze, as well as with bilat smooth pursuits. Nystagmus with smooth pursuits appears to change direction with direction of pursuits. Will defer PT eval at this time as pt is already followed by neurology, also not tolerating mobility at present due to severity of symptoms. Will await neurology note, CT report, and further dizziness medications, attempt evaluation again next day.   4:43 PM, 02/11/22 Etta Grandchild, PT, DPT Physical Therapist - [facility]  [phone] ([place])    [place] C 02/11/2022, 4:38 PM

## 2022-02-11 NOTE — Evaluation (Signed)
Occupational Therapy Evaluation Patient Details Name: [name] MRN: [id number] DOB: [birth year] Today's Date: 02/11/2022   History of Present Illness 56 y.o. female with medical history significant for morbid obesity, bipolar disorder, diabetes mellitus with complications of stage III chronic kidney disease, COPD, coronary artery disease , hypertension, obstructive sleep apnea, hypothyroidism who presents to the ER via EMS for evaluation of an unsteady gait, several falls at home, nausea, vomiting and dizziness.   Clinical Impression   Pt was seen for OT evaluation this date. Prior to hospital admission, pt was generally independent with ADL and mobility, although, does endorse sometimes needing assist from spouse for bathing and mobility 2/2 dizziness. She also admits to 2 falls in past 3 days due to dizziness. Pt supine upon OT's arrival, endorses moderate dizziness that then worsens with bed mobility. Supervision for sup>sit. Pt declined standing attempts due to the dizziness but completed lateral scoots EOB to improve positioning with supervision. Pt returned to bed as transport staff ready to take her down for a repeat head CT. Currently pt demonstrates impairments as described below (See OT problem list) which functionally limit her safety and ability to perform ADL/self-care tasks. Pt currently requires MIN A for LB ADL tasks from seated position 2/2 dizziness. Pt would benefit from skilled OT services to address noted impairments and functional limitations (see below for any additional details) in order to maximize safety and independence while minimizing falls risk and caregiver burden. Upon hospital discharge, recommend HHOT to maximize pt safety and return to functional independence during meaningful occupations of daily life, pending improvement in symptoms.    Recommendations for follow up therapy are one component of a multi-disciplinary discharge planning process, led by the attending  physician.  Recommendations may be updated based on patient status, additional functional criteria and insurance authorization.   Follow Up Recommendations  Home health OT    Assistance Recommended at Discharge Frequent or constant Supervision/Assistance  Patient can return home with the following A little help with walking and/or transfers;A little help with bathing/dressing/bathroom;Assistance with cooking/housework;Assist for transportation;Help with stairs or ramp for entrance    Functional Status Assessment  Patient has had a recent decline in their functional status and demonstrates the ability to make significant improvements in function in a reasonable and predictable amount of time.  Equipment Recommendations  None recommended by OT    Recommendations for Other Services       Precautions / Restrictions Precautions Precautions: Fall Restrictions Weight Bearing Restrictions: No      Mobility Bed Mobility Overal bed mobility: Needs Assistance Bed Mobility: Supine to Sit, Sit to Supine     Supine to sit: Min guard, HOB elevated Sit to supine: Supervision   General bed mobility comments: increased dizziness with bed mobility    Transfers Overall transfer level: Needs assistance Equipment used: None Transfers: Bed to chair/wheelchair/BSC            Lateral/Scoot Transfers: Supervision General transfer comment: pt declined to attempt standing 2/2 dizziness worsening with standing, agreeable to lateral scoots which she was able to complete without direct assist      Balance Overall balance assessment: Needs assistance Sitting-balance support: Single extremity supported, Bilateral upper extremity supported, Feet supported Sitting balance-Leahy Scale: Fair Sitting balance - Comments: limited by dizziness                                   ADL either  performed or assessed with clinical judgement   ADL                                          General ADL Comments: Pt currently requires MIN A for LB ADL tasks from seated position, limited by dizziness     Vision         Perception     Praxis      Pertinent Vitals/Pain Pain Assessment Pain Assessment: 0-10 Pain Score: 5  Pain Location: headache Pain Descriptors / Indicators: Headache Pain Intervention(s): Monitored during session, Patient requesting pain meds-RN notified     Hand Dominance     Extremity/Trunk Assessment Upper Extremity Assessment Upper Extremity Assessment: Overall WFL for tasks assessed   Lower Extremity Assessment Lower Extremity Assessment: Overall WFL for tasks assessed       Communication     Cognition Arousal/Alertness: Awake/alert Behavior During Therapy: WFL for tasks assessed/performed Overall Cognitive Status: No family/caregiver present to determine baseline cognitive functioning                                 General Comments: follows commands, grossly WFL for session, slightly slow processing noted     General Comments       Exercises     Shoulder Instructions      Home Living Family/patient expects to be discharged to:: Private residence Living Arrangements: Spouse/significant other (lives with spouse, dtr next door) Available Help at Discharge: Family;Available 24 hours/day;Available PRN/intermittently Type of Home: Mobile home Home Access: Stairs to enter Entrance Stairs-Number of Steps: 5 Entrance Stairs-Rails: Right;Left;Can reach both Home Layout: One level     Bathroom Shower/Tub: Teacher, early years/pre: Standard     Home Equipment: Conservation officer, nature (2 wheels);Cane - quad;Wheelchair - manual;BSC/3in1      Lives With: Spouse    Prior Functioning/Environment Prior Level of Function : Independent/Modified Independent             Mobility Comments: amb without AD, 2 falls in past 3 days 2/2 dizziness ADLs Comments: indep with ADL but "sometimes" needs assist  for bathing and mobility 2/2 dizziness. Spouse drives.        OT Problem List: Pain;Impaired balance (sitting and/or standing);Decreased knowledge of use of DME or AE      OT Treatment/Interventions: Self-care/ADL training;Therapeutic exercise;Therapeutic activities;DME and/or AE instruction;Patient/family education;Balance training    OT Goals(Current goals can be found in the care plan section) Acute Rehab OT Goals Patient Stated Goal: feel better OT Goal Formulation: With patient Time For Goal Achievement: 02/25/22 Potential to Achieve Goals: Good ADL Goals Pt Will Perform Grooming: with modified independence Pt Will Perform Lower Body Dressing: with modified independence Pt Will Transfer to Toilet: with modified independence Pt Will Perform Toileting - Clothing Manipulation and hygiene: with modified independence  OT Frequency: Min 2X/week    Co-evaluation              AM-PAC OT "6 Clicks" Daily Activity     Outcome Measure Help from another person eating meals?: None Help from another person taking care of personal grooming?: A Little Help from another person toileting, which includes using toliet, bedpan, or urinal?: A Little Help from another person bathing (including washing, rinsing, drying)?: A Little Help from another person to put on and taking off  regular upper body clothing?: A Little Help from another person to put on and taking off regular lower body clothing?: A Little 6 Click Score: 19   End of Session Nurse Communication: Patient requests pain meds  Activity Tolerance: Treatment limited secondary to medical complications (Comment) (dizziness) Patient left: in bed;with call bell/phone within reach;with nursing/sitter in room (RN and transport in room)  OT Visit Diagnosis: Other abnormalities of gait and mobility (R26.89);History of falling (Z91.81)                Time: 5465-6812 OT Time Calculation (min): 8 min Charges:  OT General Charges $OT Visit:  1 Visit OT Evaluation $OT Eval Low Complexity: 1 Low  Ardeth Perfect., MPH, MS, OTR/L ascom [phone] 02/11/22, 3:15 PM

## 2022-02-11 NOTE — Progress Notes (Signed)
PROGRESS NOTE    [name]  ERX:[id number] DOB: [birth year]  DOA: 02/10/2022 Date of Service: 02/11/22 PCP: Steele Sizer, MD   Brief Narrative / Hospital Course:  [name] is a 56 y.o. female with medical history significant for morbid obesity, bipolar disorder, diabetes mellitus with complications of stage III chronic kidney disease, COPD, coronary artery disease , hypertension, obstructive sleep apnea, hypothyroidism who presents to the ER 02/10/2022 via EMS for evaluation of an unsteady gait, several falls at home, nausea, vomiting and dizziness second episode over the course of the past week, multiple falls at home. 06/09: Admitted concern for vertigo secondary to TIA/CVA, unable to obtain MRI given patient has implanted spinal stimulator.  CT head CT angio head/neck no concerns no evidence of acute intracranial abnormality.  Plan for repeat CT in 24 hours.  Neurology consult placed. 06/10: Persistent vertigo, neurology to see today.  CT head repeated, results pending. Echo also pending. Per neurology if all testing WNL can hopefully discharge tomorrow   Consultants:  Neurology  Procedures: none    Subjective: Patient reports persistent vertigo and headache, no chest pain or shortness of breath.     ASSESSMENT & PLAN:   Principal Problem:   Vertigo Active Problems:   CKD stage 3 due to type 2 diabetes mellitus (HCC)   Coronary artery disease of native artery of native heart with stable angina pectoris (HCC)   HTN (hypertension)   Hypothyroid   Obesity (BMI 35.0-39.9 without comorbidity)   Schizoaffective disorder, bipolar type (HCC)   UTI (urinary tract infection)   Vertigo Secondary to TIA versus CVA Initial CT head, CT angio head/neck are negative Neurology following Pending repeat CT Continue Plavix, atorvastatin and aspirin Allow for permissive hypertension until acute stroke is ruled out ST: no concerns OT: home health PT: pending   Abnormal  urinalysis Patient noted to have pyuria and received a dose of fosfomycin [place] (+)epithelial cells, trace leuks, 11-20 WBC Follow-up results of urine culture  Schizoaffective disorder, bipolar type (HCC) Stable Continue BuSpar, Celexa and Seroquel unless adjusted by neurology  Obesity (BMI 35.0-39.9 without comorbidity) Patient has a BMI of 95.09 kg/m2 Complicates overall prognosis and care Lifestyle modification and exercise has been discussed with patient  Hypothyroid Stable Continue Synthroid  HTN (hypertension) We will allow for permissive hypertension until an acute stroke is ruled out Hold lisinopril and Metoprolol for now  Coronary artery disease of native artery of native heart with stable angina pectoris (HCC) Stable Continue atorvastatin, Plavix and as needed nitrates Hold metoprolol for now  CKD stage 3 due to type 2 diabetes mellitus ([place]) Patient has type 2 diabetes mellitus with complications of stage III chronic kidney disease Glycemic control with sliding scale insulin Accu-Cheks every 4 hours Will treat patient with IV Reglan for possible diabetic gastroparesis Judicious IV fluid hydration while patient is n.p.o.    DVT prophylaxis: SCD Code Status: FULL Family Communication: RN and neruology spoke w/ family today, will update further as needed Disposition Plan / TOC needs: home when medically cleared, pending PT Barriers to discharge / significant pending items: Echo and continued cardiac monitoring, repeat CT, neurology clearance hopefully discharge tomorrow 06/11             Objective: Vitals:   02/11/22 0410 02/11/22 0624 02/11/22 0831 02/11/22 1317  BP: 118/61 (!) 103/53 120/61 (!) 110/56  Pulse: 74 88 87 88  Resp: 18 18 20 18   Temp: 98.1 F (36.7 C) 99.4 F (37.4 C) 98.3  F (36.8 C) 98.6 F (37 C)  TempSrc: Oral     SpO2: 95% 97% 100% 94%  Weight:      Height:        Intake/Output Summary (Last 24 hours) at 02/11/2022  1350 Last data filed at 02/11/2022 1100 Gross per 24 hour  Intake 999 ml  Output 1000 ml  Net -1 ml   Filed Weights   02/10/22 1124 02/11/22 0029  Weight: 85.7 kg 83.9 kg    Examination:  Constitutional:  VS as above General Appearance: alert, well-developed, well-nourished, NAD Eyes: Normal lids and conjunctive, non-icteric sclera Ears, Nose, Mouth, Throat: Normal appearance Neck: No masses, trachea midline Respiratory: Normal respiratory effort Breath sounds normal, no wheeze/rhonchi/rales Cardiovascular: S1/S2 normal, no murmur/rub/gallop auscultated No lower extremity edema Musculoskeletal:  No clubbing/cyanosis of digits Neurological: No cranial nerve deficit on limited exam Psychiatric: Normal judgment/insight Normal mood and affect       Scheduled Medications:    stroke: early stages of recovery book   Does not apply Once   aspirin  81 mg Oral Daily   atorvastatin  80 mg Oral QHS   busPIRone  30 mg Oral BID   citalopram  20 mg Oral Daily   clopidogrel  75 mg Oral Daily   ezetimibe  10 mg Oral QHS   fenofibrate  54 mg Oral Daily   gabapentin  300 mg Oral BID   gabapentin  600 mg Oral QHS   insulin aspart  0-15 Units Subcutaneous TID WC   lamoTRIgine  200 mg Oral BID   levothyroxine  100 mcg Oral Daily   linaclotide  145 mcg Oral QAC breakfast   loratadine  10 mg Oral Daily   meclizine  25 mg Oral TID   pantoprazole  40 mg Oral Daily   QUEtiapine  200 mg Oral QHS   QUEtiapine  300 mg Oral QHS   Ubrogepant  1 tablet Oral QODAY    Continuous Infusions:   PRN Medications:  meclizine, nitroGLYCERIN, ondansetron (ZOFRAN) IV  Antimicrobials:  Anti-infectives (From admission, onward)    Start     Dose/Rate Route Frequency Ordered Stop   02/10/22 1500  fosfomycin (MONUROL) packet 3 g        3 g Oral  Once 02/10/22 1450 02/10/22 1500       Data Reviewed: I have personally reviewed following labs and imaging studies  CBC: Recent Labs  Lab  02/05/22 2054 02/10/22 1125  WBC 9.2 5.3  NEUTROABS 7.7  --   HGB 13.2 11.4*  HCT 42.2 36.8  MCV 92.3 95.8  PLT 229 735   Basic Metabolic Panel: Recent Labs  Lab 02/05/22 2054 02/10/22 1125  NA 143 141  K 5.0 4.3  CL 107 109  CO2 26 25  GLUCOSE 118* 252*  BUN 35* 14  CREATININE 1.20* 0.98  CALCIUM 9.5 8.6*   GFR: Estimated Creatinine Clearance: 58.8 mL/min (by C-G formula based on SCr of 0.98 mg/dL). Liver Function Tests: Recent Labs  Lab 02/05/22 2054  AST 46*  ALT 38  ALKPHOS 37*  BILITOT 1.2  PROT 6.9  ALBUMIN 3.9   Recent Labs  Lab 02/05/22 2054  LIPASE 40   No results for input(s): "AMMONIA" in the last 168 hours. Coagulation Profile: No results for input(s): "INR", "PROTIME" in the last 168 hours. Cardiac Enzymes: No results for input(s): "CKTOTAL", "CKMB", "CKMBINDEX", "TROPONINI" in the last 168 hours. BNP (last 3 results) No results for input(s): "PROBNP" in the last  8760 hours. HbA1C: No results for input(s): "HGBA1C" in the last 72 hours. CBG: Recent Labs  Lab 02/10/22 2115 02/11/22 0732 02/11/22 1302  GLUCAP 143* 116* 199*   Lipid Profile: Recent Labs    02/11/22 0642  CHOL 67  HDL 21*  LDLCALC 17  TRIG 146  CHOLHDL 3.2   Thyroid Function Tests: No results for input(s): "TSH", "T4TOTAL", "FREET4", "T3FREE", "THYROIDAB" in the last 72 hours. Anemia Panel: No results for input(s): "VITAMINB12", "FOLATE", "FERRITIN", "TIBC", "IRON", "RETICCTPCT" in the last 72 hours. Urine analysis:    Component Value Date/Time   COLORURINE YELLOW (A) 02/10/2022 1125   APPEARANCEUR HAZY (A) 02/10/2022 1125   APPEARANCEUR Hazy (A) 09/19/2021 0937   LABSPEC 1.025 02/10/2022 1125   LABSPEC 1.026 10/09/2014 1808   PHURINE 5.0 02/10/2022 1125   GLUCOSEU >=500 (A) 02/10/2022 1125   GLUCOSEU >=500 10/09/2014 1808   HGBUR NEGATIVE 02/10/2022 1125   HGBUR moderate 01/04/2009 0913   BILIRUBINUR NEGATIVE 02/10/2022 1125   BILIRUBINUR Negative  09/19/2021 0937   BILIRUBINUR Negative 10/09/2014 1808   KETONESUR NEGATIVE 02/10/2022 1125   PROTEINUR NEGATIVE 02/10/2022 1125   UROBILINOGEN 0.2 04/18/2021 1025   UROBILINOGEN 0.2 01/04/2009 0913   NITRITE NEGATIVE 02/10/2022 1125   LEUKOCYTESUR TRACE (A) 02/10/2022 1125   LEUKOCYTESUR Negative 10/09/2014 1808   Sepsis Labs: @LABRCNTIP (procalcitonin:4,lacticidven:4)  No results found for this or any previous visit (from the past 240 hour(s)).       Radiology Studies last 96 hours: CT ANGIO HEAD NECK W WO CM  Result Date: 02/10/2022 CLINICAL DATA:  Neuro deficit, acute, stroke suspected. Nausea, vomiting, and generalized weakness. EXAM: CT ANGIOGRAPHY HEAD AND NECK TECHNIQUE: Multidetector CT imaging of the head and neck was performed using the standard protocol during bolus administration of intravenous contrast. Multiplanar CT image reconstructions and MIPs were obtained to evaluate the vascular anatomy. Carotid stenosis measurements (when applicable) are obtained utilizing NASCET criteria, using the distal internal carotid diameter as the denominator. RADIATION DOSE REDUCTION: This exam was performed according to the departmental dose-optimization program which includes automated exposure control, adjustment of the mA and/or kV according to patient size and/or use of iterative reconstruction technique. CONTRAST:  43m OMNIPAQUE IOHEXOL 350 MG/ML SOLN COMPARISON:  Head CT 01/19/2022.  Head and neck CTA 10/04/2021. FINDINGS: CT HEAD FINDINGS Brain: There is no evidence of an acute infarct, intracranial hemorrhage, mass, midline shift, or extra-axial fluid collection. The ventricles and sulci are within normal limits for age. Hypodensities in the cerebral white matter bilaterally are unchanged and nonspecific but compatible with minimal chronic small vessel ischemic disease. Vascular: No hyperdense vessel. Skull: No fracture or suspicious osseous lesion. Sinuses: Mucous retention cysts in the  maxillary sinuses. Mild mucosal thickening in the left sphenoid sinus. Trace right mastoid effusion. Orbits: Unremarkable. Review of the MIP images confirms the above findings CTA NECK FINDINGS Aortic arch: Normal variant aortic arch branching pattern with common origin of the brachiocephalic and left common carotid arteries. Widely patent arch vessel origins. Right carotid system: Patent without evidence of stenosis or dissection. Left carotid system: Patent without evidence of stenosis or dissection. Vertebral arteries: Patent and codominant without evidence of stenosis or dissection. Skeleton: No acute osseous abnormality or suspicious osseous lesion. Focally advanced facet arthrosis on the right at C4-5. Other neck: No evidence of cervical lymphadenopathy or mass. Upper chest: No apical lung consolidation or mass. Review of the MIP images confirms the above findings CTA HEAD FINDINGS Anterior circulation: The internal carotid arteries are widely  patent from skull base to carotid termini. ACAs and MCAs are patent without evidence of a proximal branch occlusion or flow limiting proximal stenosis. There is mild branch vessel irregularity bilaterally. No aneurysm is identified. Posterior circulation: The intracranial vertebral arteries are widely patent to the basilar. Patent PICA, AICA, and SCA origins are seen bilaterally. The basilar artery is widely patent. There are large left and diminutive or absent right posterior communicating arteries with hypoplasia of the left P1 segment. Both PCAs are patent without evidence of a significant proximal stenosis. No aneurysm is identified. Venous sinuses: As permitted by contrast timing, patent. Anatomic variants: Predominantly fetal type origin of the left PCA. Review of the MIP images confirms the above findings IMPRESSION: 1. No evidence of acute intracranial abnormality. 2. No large vessel occlusion or significant proximal stenosis in the head or neck. Electronically  Signed   By: Logan Bores M.D.   On: 02/10/2022 15:00            LOS: 1 day    Time spent: 35 min    Emeterio Reeve, DO [facility] 02/11/2022, 1:50 PM   Staff may message me via secure chat in [place]  but this may not receive immediate response,  please page for urgent matters!  If 7PM-7AM, please contact night-coverage [email or web address]  Dictation software was used to generate the above note. Typos may occur and escape review, as with typed/written notes. Please contact Dr Sheppard Coil directly for clarity if needed.

## 2022-02-12 ENCOUNTER — Inpatient Hospital Stay (HOSPITAL_COMMUNITY)
Admit: 2022-02-12 | Discharge: 2022-02-12 | Disposition: A | Discharge status: Home / Self Care | Payer: Medicare Other | Attending: Neurology | Admitting: Neurology

## 2022-02-12 DIAGNOSIS — J449 Chronic obstructive pulmonary disease, unspecified: Secondary | ICD-10-CM | POA: Diagnosis not present

## 2022-02-12 DIAGNOSIS — G459 Transient cerebral ischemic attack, unspecified: Secondary | ICD-10-CM

## 2022-02-12 DIAGNOSIS — F250 Schizoaffective disorder, bipolar type: Secondary | ICD-10-CM | POA: Diagnosis not present

## 2022-02-12 DIAGNOSIS — E1122 Type 2 diabetes mellitus with diabetic chronic kidney disease: Secondary | ICD-10-CM | POA: Diagnosis not present

## 2022-02-12 DIAGNOSIS — Z880 Allergy status to penicillin: Secondary | ICD-10-CM | POA: Diagnosis not present

## 2022-02-12 DIAGNOSIS — R42 Dizziness and giddiness: Secondary | ICD-10-CM | POA: Diagnosis not present

## 2022-02-12 DIAGNOSIS — Z6838 Body mass index (BMI) 38.0-38.9, adult: Secondary | ICD-10-CM | POA: Diagnosis not present

## 2022-02-12 DIAGNOSIS — I25118 Atherosclerotic heart disease of native coronary artery with other forms of angina pectoris: Secondary | ICD-10-CM | POA: Diagnosis not present

## 2022-02-12 DIAGNOSIS — I129 Hypertensive chronic kidney disease with stage 1 through stage 4 chronic kidney disease, or unspecified chronic kidney disease: Secondary | ICD-10-CM | POA: Diagnosis not present

## 2022-02-12 DIAGNOSIS — K760 Fatty (change of) liver, not elsewhere classified: Secondary | ICD-10-CM | POA: Diagnosis not present

## 2022-02-12 DIAGNOSIS — G2581 Restless legs syndrome: Secondary | ICD-10-CM | POA: Diagnosis not present

## 2022-02-12 DIAGNOSIS — N183 Chronic kidney disease, stage 3 unspecified: Secondary | ICD-10-CM | POA: Diagnosis not present

## 2022-02-12 DIAGNOSIS — E669 Obesity, unspecified: Secondary | ICD-10-CM | POA: Diagnosis not present

## 2022-02-12 DIAGNOSIS — E039 Hypothyroidism, unspecified: Secondary | ICD-10-CM | POA: Diagnosis not present

## 2022-02-12 DIAGNOSIS — Z885 Allergy status to narcotic agent status: Secondary | ICD-10-CM | POA: Diagnosis not present

## 2022-02-12 DIAGNOSIS — E1140 Type 2 diabetes mellitus with diabetic neuropathy, unspecified: Secondary | ICD-10-CM | POA: Diagnosis not present

## 2022-02-12 DIAGNOSIS — E1151 Type 2 diabetes mellitus with diabetic peripheral angiopathy without gangrene: Secondary | ICD-10-CM | POA: Diagnosis not present

## 2022-02-12 LAB — ECHOCARDIOGRAM COMPLETE BUBBLE STUDY
AR max vel: 2.5 cm2
AV Peak grad: 6.3 mmHg
Ao pk vel: 1.25 m/s
Area-P 1/2: 3.46 cm2
Calc EF: 60.6 %
S' Lateral: 3.01 cm
Single Plane A2C EF: 63.3 %
Single Plane A4C EF: 58.8 %

## 2022-02-12 LAB — GLUCOSE, CAPILLARY
Glucose-Capillary: 206 mg/dL — ABNORMAL HIGH (ref 70–99)
Glucose-Capillary: 185 mg/dL — ABNORMAL HIGH (ref 70–99)
Glucose-Capillary: 222 mg/dL — ABNORMAL HIGH (ref 70–99)

## 2022-02-12 LAB — URINE CULTURE

## 2022-02-12 MED ORDER — MECLIZINE HCL 25 MG PO TABS: 25.0000 mg | ORAL_TABLET | Freq: Three times a day (TID) | ORAL | 0 refills | Status: DC | PRN

## 2022-02-12 NOTE — Evaluation (Signed)
Physical Therapy Evaluation Patient Details Name: [name] MRN: [id number] DOB: 26-Feb-1966 Today's Date: 02/12/2022  History of Present Illness  Patient is a 56 year old women with history significant for bipolar disorder, DM2, CKD stage III, COPD, CAD, HTN, OSA, hypothyroidism who is admitted for recurrent vertigo now persistent x2 days associated with nausea, vomiting, and imbalance. Ddx includes central (2/2 ischemic stroke) vs peripheral (more likely vestibular neuronitis or labrynthitis than BPPV based on hx)  Clinical Impression  Patient is agreeable to PT evaluation. She is eager to be discharged home today. She is usually independent with mobility without assistive device. Falls recently at home have been due to dizziness.  Today, the patient reports no dizziness and no nystagmus is noted. The patient required only supervision for bed mobility and transfers. She ambulated in hallway with rolling walker without loss of balance but activity tolerance limited by feeling generalized weakness. Educated patient on tips to progress activity slowly at home and for fall prevention strategies. Recommend to use rolling walker initially with ambulation for fall prevention. Patient declined the need for HHPT and reports she has a rolling walker at home. PT will follow while in the hospital to maximize independence and return to prior level of function.      Recommendations for follow up therapy are one component of a multi-disciplinary discharge planning process, led by the attending physician.  Recommendations may be updated based on patient status, additional functional criteria and insurance authorization.  Follow Up Recommendations Home health PT    Assistance Recommended at Discharge Set up Supervision/Assistance  Patient can return home with the following  Help with stairs or ramp for entrance;Assist for transportation    Equipment Recommendations None recommended by PT  Recommendations  for Other Services       Functional Status Assessment Patient has had a recent decline in their functional status and demonstrates the ability to make significant improvements in function in a reasonable and predictable amount of time.     Precautions / Restrictions Precautions Precautions: Fall Restrictions Weight Bearing Restrictions: No      Mobility  Bed Mobility Overal bed mobility: Needs Assistance Bed Mobility: Supine to Sit       Sit to supine: Supervision   General bed mobility comments: head of bed flat and no use of bed rails    Transfers Overall transfer level: Needs assistance Equipment used: Rolling walker (2 wheels) Transfers: Sit to/from Stand            Lateral/Scoot Transfers: Supervision General transfer comment: verbal cues for safety and technique. educated patient on fall prevention strategies at home with taking time after position changes with any dizziness, etc. to assess readiness for ambulation. no dizziness is reported today with any activity and no nystagmus noted    Ambulation/Gait Ambulation/Gait assistance: Supervision Gait Distance (Feet): 110 Feet Assistive device: Rolling walker (2 wheels) Gait Pattern/deviations: Step-through pattern Gait velocity: decreased     General Gait Details: encouraged patient to use rolling walker for safety with ambulation. she does report some generalized weakness with activity. enocuraged patient to use rolling walker at home initially for fall prevention, progressing activity slowly, and taking rest breaks as needed  Stairs            Wheelchair Mobility    Modified Rankin (Stroke Patients Only)       Balance Overall balance assessment: Needs assistance Sitting-balance support: Feet supported Sitting balance-Leahy Scale: Fair     Standing balance support: No upper  extremity supported Standing balance-Leahy Scale: Fair Standing balance comment: no loss of balance with static  standing without UE support. stand by assistance for safety                             Pertinent Vitals/Pain Pain Assessment Pain Assessment: No/denies pain    Home Living Family/patient expects to be discharged to:: Private residence Living Arrangements: Spouse/significant other Available Help at Discharge: Family;Available 24 hours/day;Available PRN/intermittently Type of Home: Mobile home Home Access: Stairs to enter Entrance Stairs-Rails: Right;Left;Can reach both Entrance Stairs-Number of Steps: 5   Home Layout: One level Home Equipment: Conservation officer, nature (2 wheels);Cane - quad;Wheelchair - manual;BSC/3in1      Prior Function Prior Level of Function : Independent/Modified Independent             Mobility Comments: independent. she reports 2 falls at home recently due to dizziness ADLs Comments: intremittent assistance from spouse as needed due to dizziness recently. she does not drive typically     Hand Dominance   Dominant Hand: Right    Extremity/Trunk Assessment   Upper Extremity Assessment Upper Extremity Assessment: Overall WFL for tasks assessed    Lower Extremity Assessment Lower Extremity Assessment: Generalized weakness (endurance impaired for sustained activity)       Communication   Communication: No difficulties  Cognition Arousal/Alertness: Awake/alert Behavior During Therapy: WFL for tasks assessed/performed Overall Cognitive Status: No family/caregiver present to determine baseline cognitive functioning                                 General Comments: patient able to follow all commands with increased time        General Comments General comments (skin integrity, edema, etc.): patient reports she wants to discharge home today. she declined the need for HHPT and feels she is nearing her baseline level of functional mobility with mild weakness present. she reports no dizziness with activity today    Exercises      Assessment/Plan    PT Assessment Patient needs continued PT services  PT Problem List Decreased strength;Decreased activity tolerance;Decreased balance       PT Treatment Interventions DME instruction;Gait training;Stair training;Functional mobility training;Therapeutic activities;Therapeutic exercise;Balance training;Neuromuscular re-education;Patient/family education    PT Goals (Current goals can be found in the Care Plan section)  Acute Rehab PT Goals Patient Stated Goal: to go home today PT Goal Formulation: With patient Time For Goal Achievement: 02/26/22 Potential to Achieve Goals: Good    Frequency Min 2X/week     Co-evaluation               AM-PAC PT "6 Clicks" Mobility  Outcome Measure Help needed turning from your back to your side while in a flat bed without using bedrails?: None Help needed moving from lying on your back to sitting on the side of a flat bed without using bedrails?: A Little Help needed moving to and from a bed to a chair (including a wheelchair)?: A Little Help needed standing up from a chair using your arms (e.g., wheelchair or bedside chair)?: A Little Help needed to walk in hospital room?: A Little Help needed climbing 3-5 steps with a railing? : A Little 6 Click Score: 19    End of Session Equipment Utilized During Treatment: Gait belt Activity Tolerance: Patient tolerated treatment well Patient left: in chair;with call bell/phone within reach;with chair alarm  set Nurse Communication: Mobility status PT Visit Diagnosis: Unsteadiness on feet (R26.81);Muscle weakness (generalized) (M62.81)    Time: 4356-8616 PT Time Calculation (min) (ACUTE ONLY): 18 min   Charges:   PT Evaluation $PT Eval Low Complexity: 1 Low PT Treatments $Gait Training: 8-22 mins        Minna Merritts, PT, MPT   Percell Locus 02/12/2022, 1:38 PM

## 2022-02-12 NOTE — TOC Transition Note (Signed)
Transition of Care [facility]) - CM/SW Discharge Note   Patient Details  Name: [name] MRN: [id number] Date of Birth: 05/01/1966  [facility] CM/SW Contact:  Izola Price, RN Phone Number: 02/12/2022, 1:58 PM   Clinical Narrative:  6/11: Patient discharging to home/self care after declining HH PT per PT notes. No HH PT ordered. Simmie Davies RN CM      Final next level of care: Home/Self Care Barriers to Discharge: Barriers Resolved   Patient Goals and CMS Choice        Discharge Placement                       Discharge Plan and Services                DME Arranged: N/A DME Agency: NA       HH Arranged: NA (Declined) HH Agency: NA        Social Determinants of Health (SDOH) Interventions     Readmission Risk Interventions    04/29/2020    9:49 AM  Readmission Risk Prevention Plan  Transportation Screening Complete  Medication Review ([place]) Complete  PCP or Specialist appointment within 3-5 days of discharge Complete  HRI or Page Complete  SW Recovery Care/Counseling Consult Complete  [place] Not Applicable

## 2022-02-12 NOTE — Progress Notes (Signed)
*  PRELIMINARY RESULTS* Echocardiogram 2D Echocardiogram has been performed. A Saline Microcavitation (Bubble Study) was requested and performed on this study.  [name] 02/12/2022, 8:33 AM

## 2022-02-12 NOTE — Discharge Summary (Signed)
Physician Discharge Summary   Patient: [name] MRN: [id number]  DOB: [birth year]   Admit:     Date of Admission: 02/10/2022 Admitted from: home   Discharge: Date of discharge: 02/12/22 Disposition: Home Condition at discharge: good  CODE STATUS: FULL   Diet recommendation: Carb modified diet   Discharge Physician: Emeterio Reeve, DO [facility]     PCP: Steele Sizer, MD  Recommendations for Outpatient Follow-up:  Follow up with PCP Steele Sizer, MD in 1-2 weeks Please obtain labs/tests: none Please follow up on the following pending results: none Med changes ondischarge to follow on w/ PCP: reduced Seroquel, held metoprolol       Brief Narrative / Hospital Course:  [name] is a 56 y.o. female with medical history significant for morbid obesity, bipolar disorder, diabetes mellitus with complications of stage III chronic kidney disease, COPD, coronary artery disease , hypertension, obstructive sleep apnea, hypothyroidism who presents to the ER 02/10/2022 via EMS for evaluation of an unsteady gait, several falls at home, nausea, vomiting and dizziness second episode over the course of the past week, multiple falls at home. 06/09: Admitted concern for vertigo secondary to TIA/CVA, unable to obtain MRI given patient has implanted spinal stimulator.  CT head CT angio head/neck no concerns no evidence of acute intracranial abnormality.  Plan for repeat CT in 24 hours.  Neurology consult placed. 06/10: Persistent vertigo, neurology to see today.  CT head repeated, results pending. Echo also pending. Per neurology if all testing WNL can hopefully discharge tomorrow  06/11: PT ambulated patient and she did well, vertigo improving though not fully resolved, neurology cleared for discharge pending echo and there were no concerns on echocardiogram. Pt felt well to leave    Consultants:  Neurology   Procedures: none    Discharge Diagnoses: Principal  Problem:   Vertigo Active Problems:   CKD stage 3 due to type 2 diabetes mellitus ([name])   Coronary artery disease of native artery of native heart with stable angina pectoris ([name])   HTN (hypertension)   Hypothyroid   Obesity (BMI 35.0-39.9 without comorbidity)   Schizoaffective disorder, bipolar type ([name])   Abnormal urinalysis    Assessment & Plan:  Vertigo Secondary to TIA versus CVA Initial CT head, CT angio head/neck are negative Neurology following Pending repeat CT Continue Plavix, atorvastatin and aspirin Allow for permissive hypertension until acute stroke is ruled out ST: no concerns OT: home health PT: home health Per TOC pt declining home health at this time    Abnormal urinalysis Patient received a dose of fosfomycin in ED UA (+)epithelial cells, trace leuks, 11-20 WBC UCx multiple species likely contaminant Follow outpatient, no symptoms, will not treat for UTI  Schizoaffective disorder, bipolar type ([name]) Stable Continue BuSpar, Celexa Home dose seroquel reduced   Obesity (BMI 35.0-39.9 without comorbidity) Patient has a BMI of 63.14 kg/m2 Complicates overall prognosis and care Lifestyle modification and exercise has been discussed with patient  Hypothyroid Stable Continue Synthroid  HTN (hypertension) We will allow for permissive hypertension until an acute stroke is ruled out Hold lisinopril and Metoprolol inpatient Restarted lisinopril on discharge given BP improved   Coronary artery disease of native artery of native heart with stable angina pectoris ([name]) Stable Continue atorvastatin, Plavix and as needed nitrates Hold metoprolol for now  CKD stage 3 due to type 2 diabetes mellitus ([name]) Patient has type 2 diabetes mellitus with complications of stage III chronic kidney disease Glycemic control with  sliding scale insulin Accu-Cheks every 4 hours Will treat patient with IV Reglan for possible diabetic gastroparesis Judicious IV fluid  hydration while patient is n.p.o.      Discharge Instructions  Discharge Instructions     Diet Carb Modified   Complete by: As directed    Discharge instructions   Complete by: As directed    Stroke/TIA ruled out! Vertigo may persist, may come and go, but overall should improve and resolve. Please follow up with your PCP in the next 1-2 weeks to monitor this or to complete further workup if it is not going away. You have been prescribed Meclizine to take as needed for vertigo/dizziness.   Increase activity slowly   Complete by: As directed        Allergies as of 02/12/2022       Reactions   Mirtazapine Anaphylaxis   REACTION: closes throat   Morphine And Related    anaphylaxis   Sulfamethoxazole-trimethoprim Rash   REACTION: Whelps all over   Vicodin Hp [hydrocodone-acetaminophen]    Amoxicillin Er Nausea And Vomiting   Pt doesn't remember problem with amoxicillin -  SE Has patient had a PCN reaction causing immediate rash, facial/tongue/throat swelling, SOB or lightheadedness with hypotension: No Has patient had a PCN reaction causing severe rash involving mucus membranes or skin necrosis: No Has patient had a PCN reaction that required hospitalization: No Has patient had a PCN reaction occurring within the last 10 years: Yes If all of the above answers are "NO", then may proceed with Cephalosporin use.   Codeine Itching   REACTION: itch, rash   Hydrocodone-acetaminophen Rash   REACTION: rash   Keflex [cephalexin] Rash   Pt does not remember having a reaction or rash, no airway involvement   Prasterone Nausea And Vomiting        Medication List     STOP taking these medications    metoprolol tartrate 25 MG tablet Commonly known as: LOPRESSOR       TAKE these medications    atorvastatin 80 MG tablet Commonly known as: LIPITOR Take 80 mg by mouth at bedtime.   busPIRone 30 MG tablet Commonly known as: BUSPAR Take 30 mg by mouth 2 (two) times daily.    cetirizine 10 MG tablet Commonly known as: ZYRTEC Take 1 tablet (10 mg total) by mouth daily.   citalopram 20 MG tablet Commonly known as: CELEXA Take 20 mg by mouth daily.   clopidogrel 75 MG tablet Commonly known as: Plavix Take 1 tablet (75 mg total) by mouth daily.   esomeprazole 40 MG capsule Commonly known as: NEXIUM TAKE 1 CAPSULE BY MOUTH IN THE MORNING   ezetimibe 10 MG tablet Commonly known as: ZETIA Take 10 mg by mouth at bedtime.   fenofibrate micronized 134 MG capsule Commonly known as: LOFIBRA Take 134 mg by mouth at bedtime.   fluticasone 50 MCG/ACT nasal spray Commonly known as: FLONASE PLACE 2 SPRAYS INTO BOTH NOSTRILS DAILY   gabapentin 300 MG capsule Commonly known as: NEURONTIN Take 300 mg by mouth 2 (two) times daily.   gabapentin 600 MG tablet Commonly known as: NEURONTIN Take 600 mg by mouth at bedtime.   Glyxambi 25-5 MG Tabs Generic drug: Empagliflozin-linaGLIPtin Take 1 tablet by mouth daily.   HumuLIN R U-500 KwikPen 500 UNIT/ML KwikPen Generic drug: insulin regular human CONCENTRATED Inject 70 Units into the skin 3 (three) times daily with meals.   Insulin Aspart FlexPen 100 UNIT/ML Commonly known as: NOVOLOG Inject into  the skin.   ketoconazole 2 % cream Commonly known as: NIZORAL Apply 1 application. topically daily.   lamoTRIgine 200 MG tablet Commonly known as: LAMICTAL Take 200 mg by mouth 2 (two) times daily.   linaclotide 145 MCG Caps capsule Commonly known as: LINZESS Take 1 capsule (145 mcg total) by mouth daily before breakfast.   lisinopril 2.5 MG tablet Commonly known as: ZESTRIL Take 1 tablet (2.5 mg total) by mouth daily.   meclizine 25 MG tablet Commonly known as: ANTIVERT Take 1 tablet (25 mg total) by mouth 3 (three) times daily as needed for dizziness or nausea.   nitroGLYCERIN 0.4 MG SL tablet Commonly known as: NITROSTAT Place 1 tablet (0.4 mg total) under the tongue every 5 (five) minutes as  needed for chest pain.   OneTouch Delica Lancets 56L Misc by Does not apply route.   Pen Needles 31G X 8 MM Misc   promethazine 12.5 MG tablet Commonly known as: PHENERGAN Take 1 tablet (12.5 mg total) by mouth every 6 (six) hours as needed.   promethazine 25 MG suppository Commonly known as: PHENERGAN Place 1 suppository (25 mg total) rectally every 6 (six) hours as needed for nausea or vomiting.   QUEtiapine 300 MG tablet Commonly known as: SEROQUEL Take 300 mg by mouth at bedtime. Along with 239m dose for a total of 5071mWhat changed: Another medication with the same name was removed. Continue taking this medication, and follow the directions you see here.   SUMAtriptan 100 MG tablet Commonly known as: IMITREX TAKE 1 TABLET BY MOUTH ONCE FOR 1 DOSE. MAY REPEAT IN 2 HOURS IF HEADACHE PERSISTS OR RECURS.   Synthroid 100 MCG tablet Generic drug: levothyroxine Take 100 mcg by mouth daily.   Trulicity 1.5 MGOV/5.6EPopn Generic drug: Dulaglutide Inject 1.5 mg into the skin once a week. (Wednesdays)   Ubrelvy 100 MG Tabs Generic drug: Ubrogepant Take 1 tablet by mouth every other day.        Diet Orders (From admission, onward)     Start     Ordered   02/12/22 0000  Diet Carb Modified        02/12/22 1348   02/10/22 1814  Diet Carb Modified Fluid consistency: Thin; Room service appropriate? Yes  Diet effective now       Question Answer Comment  Diet-HS Snack? Nothing   Calorie Level Medium 1600-2000   Fluid consistency: Thin   Room service appropriate? Yes      02/10/22 1814               Allergies  Allergen Reactions   Mirtazapine Anaphylaxis    REACTION: closes throat   Morphine And Related     anaphylaxis   Sulfamethoxazole-Trimethoprim Rash    REACTION: Whelps all over   Vicodin Hp [Hydrocodone-Acetaminophen]    Amoxicillin Er Nausea And Vomiting    Pt doesn't remember problem with amoxicillin -  SE Has patient had a PCN reaction causing  immediate rash, facial/tongue/throat swelling, SOB or lightheadedness with hypotension: No Has patient had a PCN reaction causing severe rash involving mucus membranes or skin necrosis: No Has patient had a PCN reaction that required hospitalization: No Has patient had a PCN reaction occurring within the last 10 years: Yes If all of the above answers are "NO", then may proceed with Cephalosporin use.   Codeine Itching    REACTION: itch, rash   Hydrocodone-Acetaminophen Rash    REACTION: rash   Keflex [Cephalexin] Rash  Pt does not remember having a reaction or rash, no airway involvement   Prasterone Nausea And Vomiting     Subjective: feeling well this morning, no complaints, pt is eager to go home    Discharge Exam: Vitals:   02/12/22 0419 02/12/22 0852  BP: 107/61 (!) 122/51  Pulse: 87 94  Resp: 16 18  Temp: 98.1 F (36.7 C) 98.1 F (36.7 C)  SpO2: 98% 93%   Vitals:   02/11/22 2145 02/12/22 0011 02/12/22 0419 02/12/22 0852  BP: 140/64 (!) 128/52 107/61 (!) 122/51  Pulse: 80 80 87 94  Resp: 18 18 16 18   Temp: 98 F (36.7 C)  98.1 F (36.7 C) 98.1 F (36.7 C)  TempSrc:      SpO2: 96% 98% 98% 93%  Weight:      Height:         General: Pt is alert, awake, not in acute distress Cardiovascular: RRR, S1/S2 +, no rubs, no gallops Respiratory: CTA bilaterally, no wheezing, no rhonchi Abdominal: Soft, NT, ND, bowel sounds + Extremities: no edema, no cyanosis     The results of significant diagnostics from this hospitalization (including imaging, microbiology, ancillary and laboratory) are listed below for reference.     Microbiology: Recent Results (from the past 240 hour(s))  Urine Culture     Status: Abnormal   Collection Time: 02/10/22  4:49 PM   Specimen: Urine, Clean Catch  Result Value Ref Range Status   Specimen Description   Final    URINE, CLEAN CATCH Performed at [facility], [address] [place], [place] [address]    Special  Requests   Final    NONE Performed at [facility], [address] [place], [place] [address]    Culture MULTIPLE SPECIES PRESENT, SUGGEST RECOLLECTION (A)  Final   Report Status 02/12/2022 FINAL  Final     Labs: BNP (last 3 results) Recent Labs    10/04/21 1259  BNP 8.7   Basic Metabolic Panel: Recent Labs  Lab 02/05/22 2054 02/10/22 1125  NA 143 141  K 5.0 4.3  CL 107 109  CO2 26 25  GLUCOSE 118* 252*  BUN 35* 14  CREATININE 1.20* 0.98  CALCIUM 9.5 8.6*   Liver Function Tests: Recent Labs  Lab 02/05/22 2054  AST 46*  ALT 38  ALKPHOS 37*  BILITOT 1.2  PROT 6.9  ALBUMIN 3.9   Recent Labs  Lab 02/05/22 2054  LIPASE 40   No results for input(s): "AMMONIA" in the last 168 hours. CBC: Recent Labs  Lab 02/05/22 2054 02/10/22 1125  WBC 9.2 5.3  NEUTROABS 7.7  --   HGB 13.2 11.4*  HCT 42.2 36.8  MCV 92.3 95.8  PLT 229 183   Cardiac Enzymes: No results for input(s): "CKTOTAL", "CKMB", "CKMBINDEX", "TROPONINI" in the last 168 hours. BNP: Invalid input(s): "POCBNP" CBG: Recent Labs  Lab 02/11/22 1302 02/11/22 1738 02/11/22 2134 02/12/22 0850 02/12/22 1151  GLUCAP 199* 240* 206* 185* 222*   D-Dimer No results for input(s): "DDIMER" in the last 72 hours. Hgb A1c No results for input(s): "HGBA1C" in the last 72 hours. Lipid Profile Recent Labs    02/11/22 0642  CHOL 67  HDL 21*  LDLCALC 17  TRIG 146  CHOLHDL 3.2   Thyroid function studies No results for input(s): "TSH", "T4TOTAL", "T3FREE", "THYROIDAB" in the last 72 hours.  Invalid input(s): "FREET3" Anemia work up No results for input(s): "VITAMINB12", "FOLATE", "FERRITIN", "TIBC", "IRON", "RETICCTPCT" in the  last 72 hours. Urinalysis    Component Value Date/Time   COLORURINE YELLOW (A) 02/10/2022 1125   APPEARANCEUR HAZY (A) 02/10/2022 1125   APPEARANCEUR Hazy (A) 09/19/2021 0937   LABSPEC 1.025 02/10/2022 1125   LABSPEC 1.026 10/09/2014 1808   PHURINE 5.0 02/10/2022  1125   GLUCOSEU >=500 (A) 02/10/2022 1125   GLUCOSEU >=500 10/09/2014 1808   HGBUR NEGATIVE 02/10/2022 1125   HGBUR moderate 01/04/2009 0913   BILIRUBINUR NEGATIVE 02/10/2022 1125   BILIRUBINUR Negative 09/19/2021 0937   BILIRUBINUR Negative 10/09/2014 1808   KETONESUR NEGATIVE 02/10/2022 1125   PROTEINUR NEGATIVE 02/10/2022 1125   UROBILINOGEN 0.2 04/18/2021 1025   UROBILINOGEN 0.2 01/04/2009 0913   NITRITE NEGATIVE 02/10/2022 1125   LEUKOCYTESUR TRACE (A) 02/10/2022 1125   LEUKOCYTESUR Negative 10/09/2014 1808   Sepsis Labs Recent Labs  Lab 02/05/22 2054 02/10/22 1125  WBC 9.2 5.3   Microbiology Recent Results (from the past 240 hour(s))  Urine Culture     Status: Abnormal   Collection Time: 02/10/22  4:49 PM   Specimen: Urine, Clean Catch  Result Value Ref Range Status   Specimen Description   Final    URINE, CLEAN CATCH Performed at [facility], [address] [place], [place] [address]    Special Requests   Final    NONE Performed at [facility], [address] [place], [place] [address]    Culture MULTIPLE SPECIES PRESENT, SUGGEST RECOLLECTION (A)  Final   Report Status 02/12/2022 FINAL  Final   Imaging ECHOCARDIOGRAM COMPLETE BUBBLE STUDY  Result Date: 02/12/2022    ECHOCARDIOGRAM REPORT   Patient Name:   [name] [name] Date of Exam: 02/12/2022 Medical Rec #:  [id number]      Height:       59.0 in Accession #:    [id number]     Weight:       185.0 lb Date of Birth:  01-Apr-1966       BSA:          1.784 m Patient Age:    27 years       BP:           107/61 mmHg Patient Gender: F              HR:           95 bpm. Exam Location:  [facility] Procedure: 2D Echo and Saline Contrast Bubble Study Indications:     TIA G45.9  History:         Patient has prior history of Echocardiogram examinations, most                  recent 10/05/2021.  Sonographer:     Kathlen [place] RDCS Referring Phys:  [place] Diagnosing Phys: Ida Rogue MD  IMPRESSIONS  1. Left ventricular ejection fraction, by estimation, is 55 to 60%. The left ventricle has normal function. The left ventricle has no regional wall motion abnormalities. There is mild left ventricular hypertrophy. Left ventricular diastolic parameters are consistent with Grade I diastolic dysfunction (impaired relaxation).  2. Right ventricular systolic function is normal. The right ventricular size is normal. Tricuspid regurgitation signal is inadequate for assessing PA pressure.  3. The mitral valve is normal in structure. No evidence of mitral valve regurgitation. No evidence of mitral stenosis.  4. The aortic valve is normal in structure. Aortic valve regurgitation is not visualized. No aortic stenosis is present.  5. The inferior vena cava is  normal in size with greater than 50% respiratory variability, suggesting right atrial pressure of 3 mmHg.  6. Agitated saline contrast bubble study was negative, with no evidence of any interatrial shunt. FINDINGS  Left Ventricle: Left ventricular ejection fraction, by estimation, is 55 to 60%. The left ventricle has normal function. The left ventricle has no regional wall motion abnormalities. The left ventricular internal cavity size was normal in size. There is  mild left ventricular hypertrophy. Left ventricular diastolic parameters are consistent with Grade I diastolic dysfunction (impaired relaxation). Right Ventricle: The right ventricular size is normal. No increase in right ventricular wall thickness. Right ventricular systolic function is normal. Tricuspid regurgitation signal is inadequate for assessing PA pressure. Left Atrium: Left atrial size was normal in size. Right Atrium: Right atrial size was normal in size. Pericardium: There is no evidence of pericardial effusion. Mitral Valve: The mitral valve is normal in structure. No evidence of mitral valve regurgitation. No evidence of mitral valve stenosis. Tricuspid Valve: The tricuspid valve is  normal in structure. Tricuspid valve regurgitation is not demonstrated. No evidence of tricuspid stenosis. Aortic Valve: The aortic valve is normal in structure. Aortic valve regurgitation is not visualized. No aortic stenosis is present. Aortic valve peak gradient measures 6.2 mmHg. Pulmonic Valve: The pulmonic valve was normal in structure. Pulmonic valve regurgitation is not visualized. No evidence of pulmonic stenosis. Aorta: The aortic root is normal in size and structure. Venous: The inferior vena cava is normal in size with greater than 50% respiratory variability, suggesting right atrial pressure of 3 mmHg. IAS/Shunts: No atrial level shunt detected by color flow Doppler. Agitated saline contrast was given intravenously to evaluate for intracardiac shunting. Agitated saline contrast bubble study was negative, with no evidence of any interatrial shunt.  LEFT VENTRICLE PLAX 2D LVIDd:         4.45 cm     Diastology LVIDs:         3.01 cm     LV e' medial:    5.77 cm/s LV PW:         1.30 cm     LV E/e' medial:  11.8 LV IVS:        1.28 cm     LV e' lateral:   5.98 cm/s LVOT diam:     2.10 cm     LV E/e' lateral: 11.4 LV SV:         61 LV SV Index:   34 LVOT Area:     3.46 cm  LV Volumes (MOD) LV vol d, MOD A2C: 59.4 ml LV vol d, MOD A4C: 49.3 ml LV vol s, MOD A2C: 21.8 ml LV vol s, MOD A4C: 20.3 ml LV SV MOD A2C:     37.6 ml LV SV MOD A4C:     49.3 ml LV SV MOD BP:      33.0 ml RIGHT VENTRICLE RV Basal diam:  2.91 cm RV S prime:     14.60 cm/s TAPSE (M-mode): 1.8 cm LEFT ATRIUM             Index        RIGHT ATRIUM          Index LA diam:        3.50 cm 1.96 cm/m   RA Area:     7.63 cm LA Vol (A2C):   36.7 ml 20.57 ml/m  RA Volume:   12.10 ml 6.78 ml/m LA Vol (A4C):   42.4 ml 23.77 ml/m LA  Biplane Vol: 42.7 ml 23.93 ml/m  AORTIC VALVE                 PULMONIC VALVE AV Area (Vmax): 2.50 cm     PV Vmax:       0.81 m/s AV Vmax:        125.00 cm/s  PV Peak grad:  2.7 mmHg AV Peak Grad:   6.2 mmHg LVOT Vmax:       90.10 cm/s LVOT Vmean:     64.500 cm/s LVOT VTI:       0.176 m  AORTA Ao Root diam: 3.30 cm Ao Asc diam:  2.80 cm MITRAL VALVE MV Area (PHT): 3.46 cm    SHUNTS MV Decel Time: 219 msec    Systemic VTI:  0.18 m MV E velocity: 68.10 cm/s  Systemic Diam: 2.10 cm MV A velocity: 75.80 cm/s MV E/A ratio:  0.90 Ida Rogue MD Electronically signed by Ida Rogue MD Signature Date/Time: 02/12/2022/8:48:39 AM    Final       Time coordinating discharge: Over 30 minutes  SIGNED:  Emeterio Reeve DO [facility]

## 2022-02-13 ENCOUNTER — Telehealth: Payer: Self-pay

## 2022-02-13 NOTE — Telephone Encounter (Signed)
Transition Care Management Unsuccessful Follow-up Telephone Call  Date of discharge and from where:  02/12/22 [facility]  Attempts:  1st Attempt  Reason for unsuccessful TCM follow-up call:  Left voice message

## 2022-02-13 NOTE — Telephone Encounter (Signed)
Pt is calling back and requesting a call back.

## 2022-02-13 NOTE — Telephone Encounter (Signed)
Transition Care Management Unsuccessful Follow-up Telephone Call  Date of discharge and from where:  02/12/22 [facility]  Attempts:  2nd Attempt  Reason for unsuccessful TCM follow-up call:  Left voice message

## 2022-02-14 NOTE — Telephone Encounter (Signed)
Transition Care Management Follow-up Telephone Call Date of discharge and from where: 02/12/22 [facility] How have you been since you were released from the hospital? Pt states she is doing okay Any questions or concerns? No  Items Reviewed: Did the pt receive and understand the discharge instructions provided? Yes  Medications obtained and verified? Yes  Other? No  Any new allergies since your discharge? No  Dietary orders reviewed? Yes Do you have support at home? Yes   Home Care and Equipment/Supplies: Were home health services ordered? No - pt declined Were any new equipment or medical supplies ordered?  No  Functional Questionnaire: (I = Independent and D = Dependent) ADLs: I  Bathing/Dressing- I  Meal Prep- I  Eating- i  Maintaining continence- i  Transferring/Ambulation- i  Managing Meds- I  Follow up appointments reviewed:  PCP Hospital f/u appt confirmed? Yes  Scheduled to see Serafina Royals NP on 02/21/22 @ 3:00. [place] [facility] f/u appt confirmed? N/a Are transportation arrangements needed? No  If their condition worsens, is the pt aware to call PCP or go to the Emergency Dept.? Yes Was the patient provided with contact information for the PCP's office or ED? Yes Was to pt encouraged to call back with questions or concerns? Yes

## 2022-02-15 ENCOUNTER — Telehealth: Payer: Self-pay | Admitting: *Deleted

## 2022-02-15 NOTE — Telephone Encounter (Signed)
   Telephone encounter was:  Successful.  02/15/2022 Name: [name] MRN: [id number] DOB: [birth year]  [name] is a 56 y.o. year old female who is a primary care patient of Steele Sizer, MD . The community resource team was consulted for assistance with [place] guide performed the following interventions: Patient provided with information about care guide support team and interviewed to confirm resource needs Discussed resources to assist with food . patient in need of food resources , her phone kept cutting out called 3x will mail food banks for [place]  Follow Up Plan:  No further follow up planned at this time. The patient has been provided with needed resources.  [place], Care Management  [phone] [address] , [place] [address] Email : [email or web address]

## 2022-02-21 ENCOUNTER — Other Ambulatory Visit: Payer: Self-pay

## 2022-02-21 ENCOUNTER — Encounter: Payer: Self-pay | Admitting: Family Medicine

## 2022-02-21 ENCOUNTER — Ambulatory Visit (INDEPENDENT_AMBULATORY_CARE_PROVIDER_SITE_OTHER): Payer: Medicare Other | Admitting: Family Medicine

## 2022-02-21 VITALS — BP 134/78 | HR 81 | Temp 98.5°F | Resp 16 | Ht 59.0 in | Wt 195.8 lb

## 2022-02-21 DIAGNOSIS — F250 Schizoaffective disorder, bipolar type: Secondary | ICD-10-CM | POA: Diagnosis not present

## 2022-02-21 DIAGNOSIS — R42 Dizziness and giddiness: Secondary | ICD-10-CM | POA: Diagnosis not present

## 2022-02-21 DIAGNOSIS — G43009 Migraine without aura, not intractable, without status migrainosus: Secondary | ICD-10-CM

## 2022-02-21 DIAGNOSIS — G459 Transient cerebral ischemic attack, unspecified: Secondary | ICD-10-CM

## 2022-02-21 MED ORDER — MECLIZINE HCL 25 MG PO TABS: 25.0000 mg | ORAL_TABLET | Freq: Three times a day (TID) | ORAL | 0 refills | Status: DC | PRN

## 2022-02-21 MED ORDER — SUMATRIPTAN SUCCINATE 100 MG PO TABS: ORAL_TABLET | ORAL | 0 refills | Status: DC

## 2022-02-21 NOTE — Assessment & Plan Note (Signed)
Continues to do well on reduced seroquel, continue.

## 2022-02-21 NOTE — Progress Notes (Signed)
    SUBJECTIVE:   CHIEF COMPLAINT / HPI:   HOSPITAL FOLLOW UP Hospital/facility: [facility] 6/9-6/11 Diagnosis: dizziness 2/2 presumed vertigo. Admitted for stroke/TIA w/u (negative). Procedures/tests:  - CT head, CTA head/neck - NAICA - ECHO LVEF 55-60%, no WMA. G1DD. Consultants: Neurology New medications:  - reduced seroquel - held metoprolol - DAPT x21 days, followed by plavix alone. - meclizine prn Discharge instructions:   - HHPT/OT - declined - f/u with Neuro outpt, has appt in August Status: better - meclizine helping, taking once daily   OBJECTIVE:   BP 134/78   Pulse 81   Temp 98.5 F (36.9 C) (Oral)   Resp 16   Ht 4' 11"  (1.499 m)   Wt 195 lb 12.8 oz (88.8 kg)   SpO2 95%   BMI 39.55 kg/m   Gen: well appearing, in NAD Card: RRR Lungs: CTAB Ext: WWP, no edema MSK: Full ROM, strength 5/5 to U/LE bilaterally, deep reflexes intact, normal gait.  No edema.  Neuro: Alert and oriented, speech normal.  Optic field normal. PERRL, Extraocular movements intact.  Intact symmetric sensation to light touch of face and extremities bilaterally.  Hearing grossly intact bilaterally.  Tongue protrudes normally with no deviation.  Shoulder shrug, smile symmetric. Finger to nose normal. Dix Hallpike negative.   ASSESSMENT/PLAN:   Vertigo Improved though still symptomatic. Neuro exam normal today including no nystagmus with Marye Round however exacerbated symptoms. Will refer to PT for Epley maneuver. Ok to take meclizine prn. F/u prn.  Schizoaffective disorder, bipolar type ([place]) Continues to do well on reduced seroquel, continue.     Myles Gip, DO

## 2022-02-21 NOTE — Assessment & Plan Note (Signed)
Improved though still symptomatic. Neuro exam normal today including no nystagmus with [name] however exacerbated symptoms. Will refer to PT for Epley maneuver. Ok to take meclizine prn. F/u prn.

## 2022-02-22 ENCOUNTER — Telehealth: Payer: Self-pay

## 2022-02-22 NOTE — Telephone Encounter (Signed)
Pt called as had talked to Pharmacist and was told he was waiting for Dr to call back and if she can help she can be called at [phone] as needs this medication

## 2022-02-22 NOTE — Telephone Encounter (Signed)
Pt has been informed and will not take Imitrex.

## 2022-02-22 NOTE — Telephone Encounter (Signed)
Drew,pharmacist with [facility], calling to verify instructions for Imitrex and Roselyn Meier. States pt. Takes Imitrex on the days she does not take Ubrelvy. Lorrin Goodell every other day. Would like Imitrex instructions to include to take the days she does not take Ubrelvy and the maximum dose for Imitrex is 200 mg daily. Please advise pharmacy.

## 2022-03-01 ENCOUNTER — Ambulatory Visit (INDEPENDENT_AMBULATORY_CARE_PROVIDER_SITE_OTHER): Payer: Medicare Other

## 2022-03-01 DIAGNOSIS — R42 Dizziness and giddiness: Secondary | ICD-10-CM

## 2022-03-01 DIAGNOSIS — E1169 Type 2 diabetes mellitus with other specified complication: Secondary | ICD-10-CM

## 2022-03-01 NOTE — Chronic Care Management (AMB) (Signed)
Chronic Care Management   CCM RN Visit Note  03/01/2022 Name: [name] MRN: [id number] DOB: May 20, 1966  Subjective: [name] is a 56 y.o. year old female who is a primary care patient of Steele Sizer, MD. The care management team was consulted for assistance with disease management and care coordination needs.    Engaged with patient by telephone for follow up visit in response to provider referral for case management and care coordination services.   Consent to Services:  The patient was given information about Chronic Care Management services, agreed to services, and gave verbal consent prior to initiation of services.  Please see initial visit note for detailed documentation.   Assessment: Review of patient past medical history, allergies, medications, health status, including review of consultants reports, laboratory and other test data, was performed as part of comprehensive evaluation and provision of chronic care management services.   SDOH (Social Determinants of Health) assessments and interventions performed: No  CCM Care Plan  Allergies  Allergen Reactions   Mirtazapine Anaphylaxis    REACTION: closes throat   Morphine And Related     anaphylaxis   Sulfamethoxazole-Trimethoprim Rash    REACTION: Whelps all over   Vicodin Hp [Hydrocodone-Acetaminophen]    Amoxicillin Er Nausea And Vomiting    Pt doesn't remember problem with amoxicillin -  SE Has patient had a PCN reaction causing immediate rash, facial/tongue/throat swelling, SOB or lightheadedness with hypotension: No Has patient had a PCN reaction causing severe rash involving mucus membranes or skin necrosis: No Has patient had a PCN reaction that required hospitalization: No Has patient had a PCN reaction occurring within the last 10 years: Yes If all of the above answers are "NO", then may proceed with Cephalosporin use.   Codeine Itching    REACTION: itch, rash   Hydrocodone-Acetaminophen Rash     REACTION: rash   Keflex [Cephalexin] Rash    Pt does not remember having a reaction or rash, no airway involvement   Prasterone Nausea And Vomiting    Outpatient Encounter Medications as of 03/01/2022  Medication Sig Note   atorvastatin (LIPITOR) 80 MG tablet Take 80 mg by mouth at bedtime.     busPIRone (BUSPAR) 30 MG tablet Take 30 mg by mouth 2 (two) times daily.    cetirizine (ZYRTEC) 10 MG tablet Take 1 tablet (10 mg total) by mouth daily.    citalopram (CELEXA) 20 MG tablet Take 20 mg by mouth daily.    clopidogrel (PLAVIX) 75 MG tablet Take 1 tablet (75 mg total) by mouth daily.    esomeprazole (NEXIUM) 40 MG capsule TAKE 1 CAPSULE BY MOUTH IN THE MORNING    ezetimibe (ZETIA) 10 MG tablet Take 10 mg by mouth at bedtime.    fenofibrate micronized (LOFIBRA) 134 MG capsule Take 134 mg by mouth at bedtime.    fluticasone (FLONASE) 50 MCG/ACT nasal spray PLACE 2 SPRAYS INTO BOTH NOSTRILS DAILY    gabapentin (NEURONTIN) 300 MG capsule Take 300 mg by mouth 2 (two) times daily.    gabapentin (NEURONTIN) 600 MG tablet Take 600 mg by mouth at bedtime.    GLYXAMBI 25-5 MG TABS Take 1 tablet by mouth daily.    HUMULIN R U-500 KWIKPEN 500 UNIT/ML kwikpen Inject 70 Units into the skin 3 (three) times daily with meals.    Insulin Aspart FlexPen (NOVOLOG) 100 UNIT/ML Inject into the skin. 02/02/2022: Sliding scale    Insulin Pen Needle (PEN NEEDLES) 31G X 8 MM MISC  ketoconazole (NIZORAL) 2 % cream Apply 1 application. topically daily.    lamoTRIgine (LAMICTAL) 200 MG tablet Take 200 mg by mouth 2 (two) times daily.     linaclotide (LINZESS) 145 MCG CAPS capsule Take 1 capsule (145 mcg total) by mouth daily before breakfast.    lisinopril (ZESTRIL) 2.5 MG tablet Take 1 tablet (2.5 mg total) by mouth daily.    meclizine (ANTIVERT) 25 MG tablet Take 1 tablet (25 mg total) by mouth 3 (three) times daily as needed for dizziness or nausea.    nitroGLYCERIN (NITROSTAT) 0.4 MG SL tablet Place 1 tablet  (0.4 mg total) under the tongue every 5 (five) minutes as needed for chest pain.    OneTouch Delica Lancets 76B MISC by Does not apply route.    promethazine (PHENERGAN) 12.5 MG tablet Take 1 tablet (12.5 mg total) by mouth every 6 (six) hours as needed.    promethazine (PHENERGAN) 25 MG suppository Place 1 suppository (25 mg total) rectally every 6 (six) hours as needed for nausea or vomiting. 02/10/2022: Has not used yet, just started   QUEtiapine (SEROQUEL) 300 MG tablet Take 300 mg by mouth at bedtime. Along with 246m dose for a total of 5091m   SUMAtriptan (IMITREX) 100 MG tablet May repeat in 2 hours if headache persists or recurs.    SYNTHROID 100 MCG tablet Take 100 mcg by mouth daily.    TRULICITY 1.5 MGHA/1.9FXOPN Inject 1.5 mg into the skin once a week. (Wednesdays)    Ubrogepant (UBRELVY) 100 MG TABS Take 1 tablet by mouth every other day.    No facility-administered encounter medications on file as of 03/01/2022.    Patient Active Problem List   Diagnosis Date Noted   Vertigo 02/10/2022   Abnormal urinalysis 02/10/2022   TIA (transient ischemic attack) 10/04/2021   HTN (hypertension) 10/04/2021   Chronic kidney disease, stage 3a (HCYadkin01/31/2023   Depression with anxiety 10/04/2021   COPD (chronic obstructive pulmonary disease) (HCOlympia Fields01/31/2023   CAD (coronary artery disease) 10/04/2021   Chronic diastolic CHF (congestive heart failure) (HCRich Creek01/31/2023   Type II diabetes mellitus with renal manifestations (HCClatsop10/27/2022   Interstitial cystitis 06/30/2021   Atherosclerosis of aorta (HCPleasant Hill07/25/2022   Iron deficiency anemia due to chronic blood loss    Polyp of ascending colon    Vitamin B12 deficiency 08/08/2018   Patient is full code 10/30/2017   Coronary artery disease of native artery of native heart with stable angina pectoris (HCScottville02/01/2018   Plantar fascial fibromatosis 10/09/2017   Tendinitis of wrist 10/09/2017   Schizoaffective disorder, bipolar type (HCClyman 09/28/2017   Adenomatous colon polyp 10/15/2015   Bipolar affective disorder (HCColorado Springs02/06/2016   CN (constipation) 10/15/2015   HLD (hyperlipidemia) 10/15/2015   NASH (nonalcoholic steatohepatitis) 10/15/2015   Obesity (BMI 35.0-39.9 without comorbidity) 10/15/2015   Restless leg syndrome 10/15/2015   Low back pain with left-sided sciatica 08/18/2015   Apnea, sleep 05/05/2015   Heart valve disease 05/05/2015   CKD stage 3 due to type 2 diabetes mellitus (HCDover07/07/2015   Migraine 03/12/2015   Chronic bronchitis (HCBeasley05/18/2016   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HCBrownsville05/18/2016   GERD (gastroesophageal reflux disease) 01/20/2015   Hypothyroid    Vitamin D deficiency    Urinary incontinence    Liver lesion    Hypomagnesemia    Obstructive sleep apnea 10/20/2013   Allergic rhinitis 07/04/2007    Patient Care Plan: RN Care Management Plan of Care  Problem Identified: HLD, COPD-Chronic Bronchitis, DM      Long-Range Goal: Disease Progression Prevented or Minimized Completed 03/01/2022  Start Date: 01/04/2022  Expected End Date: 04/04/2022  Priority: High  Note:   Current Barriers:  Chronic Disease Management support and education needs related to HLD, COPD, and DMII   RNCM Clinical Goal(s):  Patient will demonstrate ongoing adherence to prescribed treatment plan for HLD, COPD, and DMII through collaboration with the provider, [place], and the care team.  Interventions: 1:1 collaboration with primary care provider regarding development and update of comprehensive plan of care as evidenced by provider attestation and co-signature Inter-disciplinary care team collaboration (see longitudinal plan of care) Evaluation of current treatment plan related to  self management and patient's adherence to plan as established by provider   COPD Interventions:  (Goal Met) Reviewed plan for COPD management. Denies changes or worsening symptoms d/t warmer weather. Advised to continue  practicing precautions and avoiding activities during the heat of the day to prevent heat related exacerbations. She is aware of need to monitor symptoms daily and notify a provider if experiencing moderate symptoms for greater than 48 hours without relief.  Reviewed worsening symptoms that require immediate medical attention.   Diabetes Interventions:   Reviewed plan for diabetes management. Reports compliance with plan. Reports taking medications as prescribed and monitoring blood glucose readings three times a day. Reports readings throughout the day have ranged from the 70's to low 200's. Reports fasting readings have ranged in the low 100's. Denies experiencing symptoms r/t hypoglycemia with 2 readings in the 70's. Verbalized knowledge of appropriate interventions for correcting low readings.  Discussed options for additional diabetes resources. Reports completing outreach with a  nutritionist since our last outreach. She is attempting to improve her diet. Agreed to contact the clinic if she requires additional resources. Advised to complete diabetic preventive exams as scheduled.   Hyperlipidemia Interventions:  Reviewed plan for hyperlipidemia management. Reports attempting to adhere to plan for activity and nutrition. Advised to read nutrition labels and avoid highly processed foods when possible. Advised to continue completing routing labs as ordered.   Vertigo Interventions: Discussed symptoms. Reports she has been doing well. Reports using caution when ambulating and changing positions. Denies recent fall d/t dizziness. Discussed recent referrals for Neurology and vestibular rehab d/t vertigo. Reports speaking with a referral manager and initially opted to be evaluated with a previous Neurologist in [place]. Requested assistance with contacting the team to arrange for evaluation in [place]. Contacted [facility]. Patient's referral is still active. She is  scheduled to be evaluated by Dr. April Manson on 04/11/22. Patient agreed to contact the Neurology team in [place] to cancel the previously scheduled appointment.  Patient Goals/Self-Care Activities: Take all medications as prescribed Attend all scheduled provider appointments Call pharmacy for medication refills 3-7 days in advance of running out of medications Perform all self care activities independently  Call provider office for new concerns or questions       PLAN: [name] agreed to contact the clinic if she requires additional nursing outreach.  The care team will gladly assist.    [facility] Care Management [phone]

## 2022-03-01 NOTE — Patient Instructions (Signed)
Thank you for allowing the Chronic Care Management team to participate in your care. It was great speaking with you today! Please do not hesitate to contact the clinic if you require additional outreach.   [facility] Care Management [phone]

## 2022-03-03 DIAGNOSIS — J449 Chronic obstructive pulmonary disease, unspecified: Secondary | ICD-10-CM | POA: Diagnosis not present

## 2022-03-03 DIAGNOSIS — E785 Hyperlipidemia, unspecified: Secondary | ICD-10-CM | POA: Diagnosis not present

## 2022-03-03 DIAGNOSIS — E1159 Type 2 diabetes mellitus with other circulatory complications: Secondary | ICD-10-CM | POA: Diagnosis not present

## 2022-03-10 ENCOUNTER — Other Ambulatory Visit: Payer: Self-pay | Admitting: Family Medicine

## 2022-03-10 DIAGNOSIS — J3489 Other specified disorders of nose and nasal sinuses: Secondary | ICD-10-CM

## 2022-04-04 ENCOUNTER — Other Ambulatory Visit: Payer: Self-pay | Admitting: Family Medicine

## 2022-04-07 ENCOUNTER — Other Ambulatory Visit: Payer: Self-pay

## 2022-04-07 DIAGNOSIS — I10 Essential (primary) hypertension: Secondary | ICD-10-CM

## 2022-04-11 ENCOUNTER — Ambulatory Visit: Payer: Medicare HMO | Admitting: Neurology

## 2022-04-19 ENCOUNTER — Ambulatory Visit: Payer: Self-pay

## 2022-04-19 NOTE — Chronic Care Management (AMB) (Signed)
   04/19/2022  [name] [birth year] [id number]   Documentation encounter created to complete case transition. The care management team will continue to follow [name] for care coordination as needed.   [place] [facility] [phone]

## 2022-04-20 IMAGING — CT CT ANGIO CHEST
2 of 6 series · 18 of 46 positions shown · IV contrast (omnipaque)
Comparison: Radiograph earlier today.  Chest CT 04/28/2020

CLINICAL DATA: PE suspected, high prob

Chest pain.  Pain radiates down right arm.
EXAM:
CT ANGIOGRAPHY CHEST WITH CONTRAST
TECHNIQUE: Multidetector CT imaging of the chest was performed using the
standard protocol during bolus administration of intravenous
contrast. Multiplanar CT image reconstructions and MIPs were
obtained to evaluate the vascular anatomy.
CONTRAST:  75mL OMNIPAQUE IOHEXOL 350 MG/ML SOLN

[Series 5: thins · axial · 0.68mm/px · z∈[-606,-374]mm · 15 of 256 slices shown]
[im 12/256  lung]
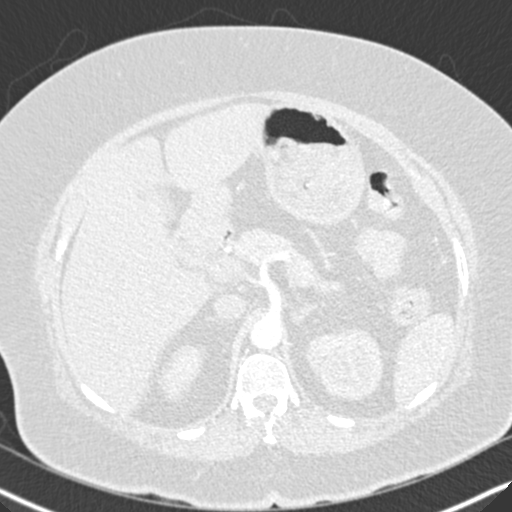
[im 34/256  soft-tissue]
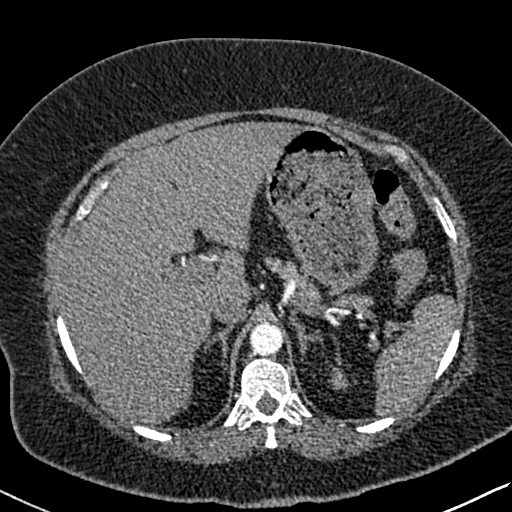
[im 45/256  lung]
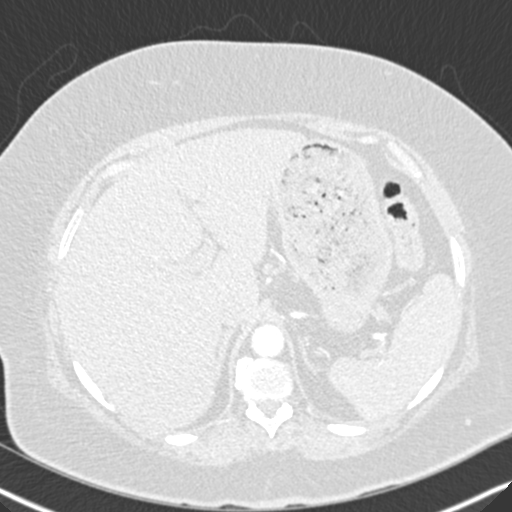
[im 67/256  soft-tissue]
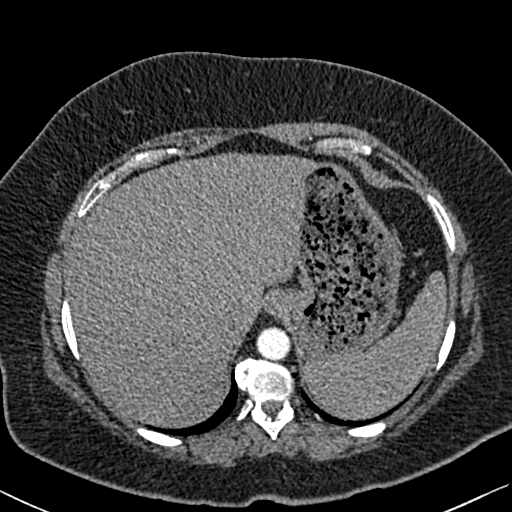
[im 78/256  lung]
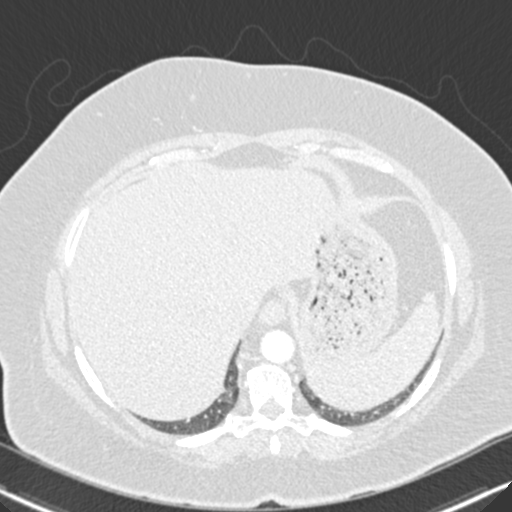
[im 100/256  soft-tissue]
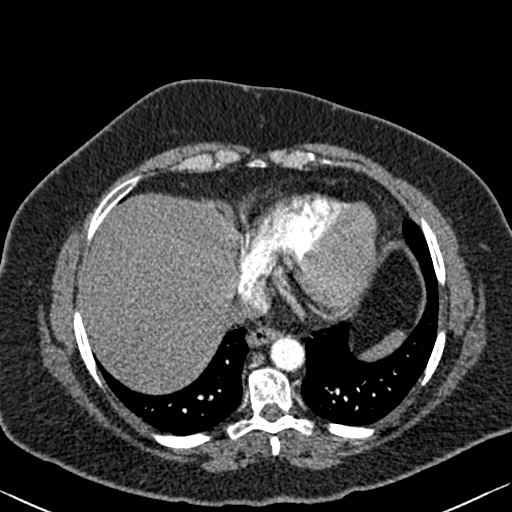
[im 111/256  lung]
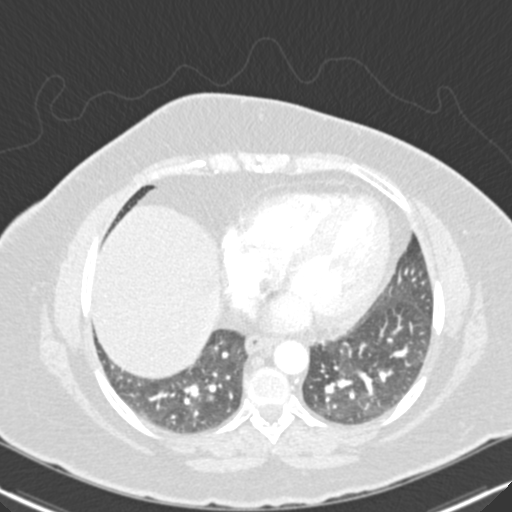
[im 134/256  soft-tissue]
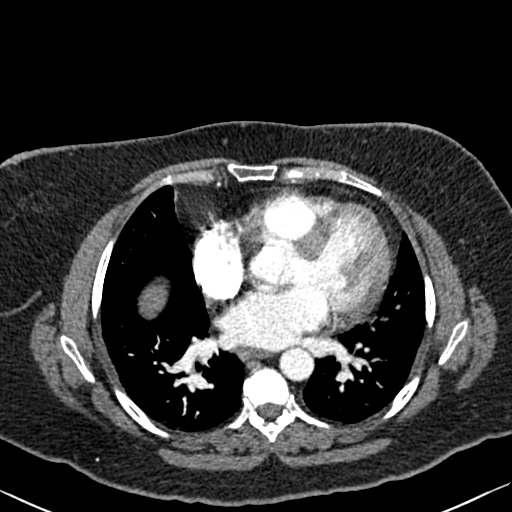
[im 145/256  lung]
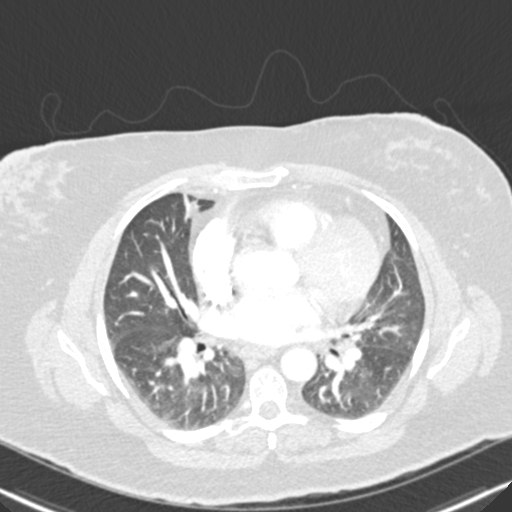
[im 156/256  soft-tissue]
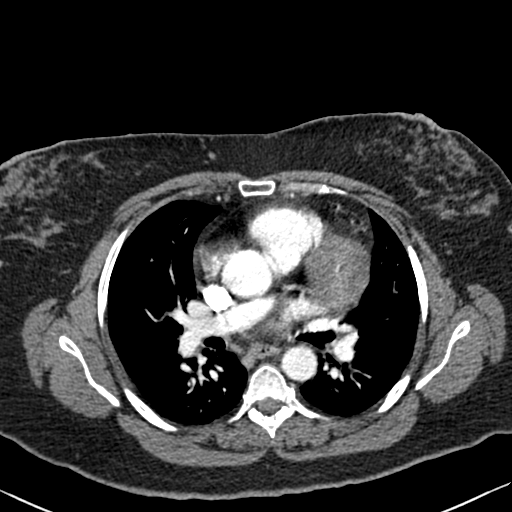
[im 178/256  lung]
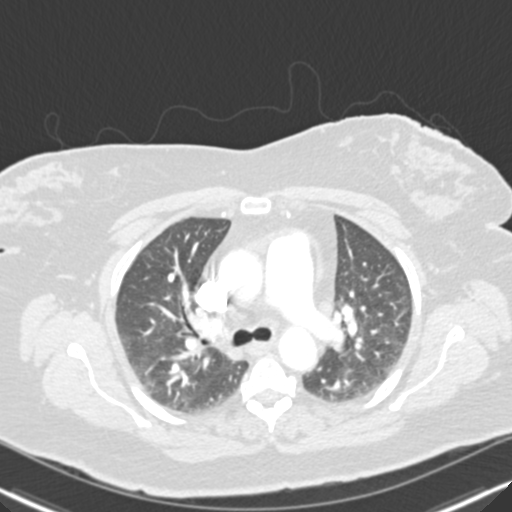
[im 189/256  soft-tissue]
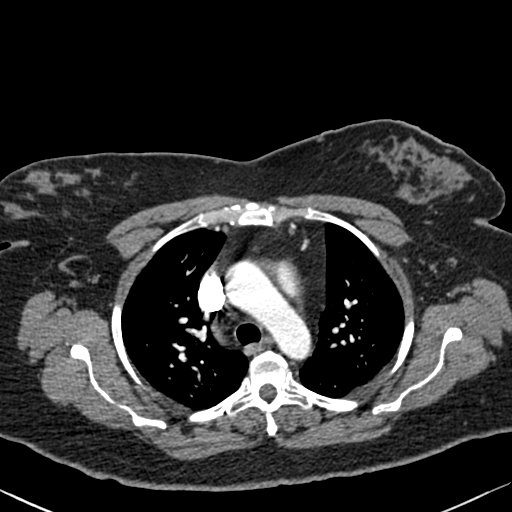
[im 211/256  lung]
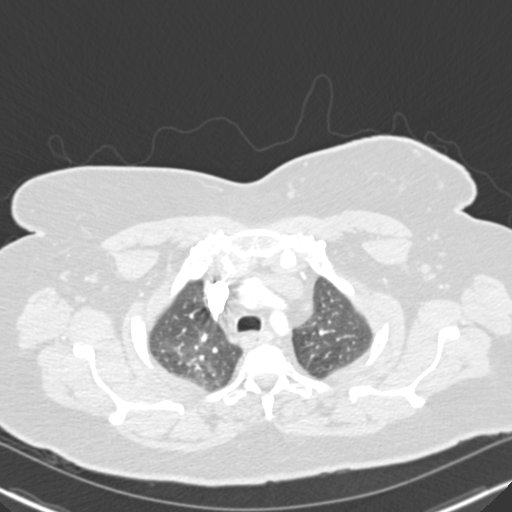
[im 222/256  soft-tissue]
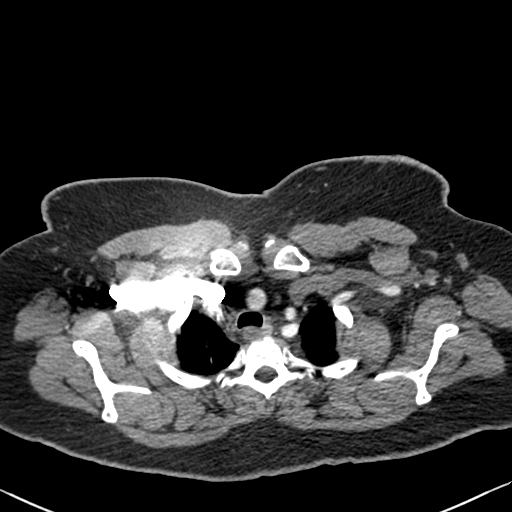
[im 244/256  lung]
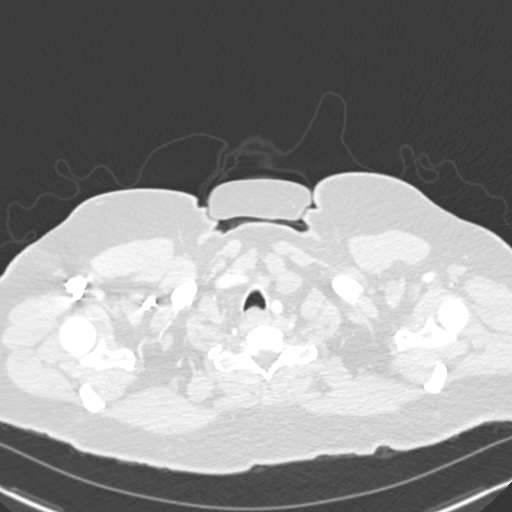

[Series 7: coronal mpr · coronal · 0.56mm/px · 3 of 97 slices shown]
[im 25/97  soft-tissue]
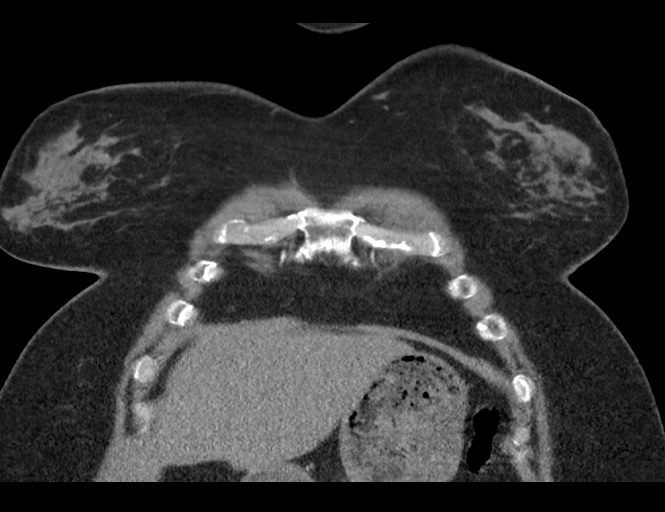
[im 49/97  soft-tissue]
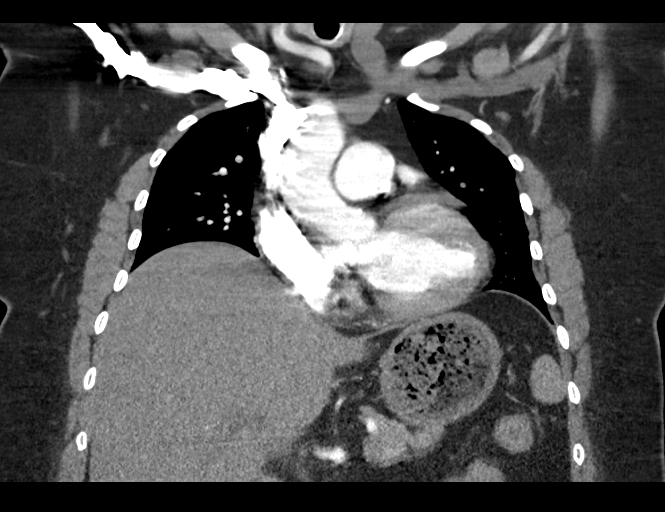
[im 73/97  soft-tissue]
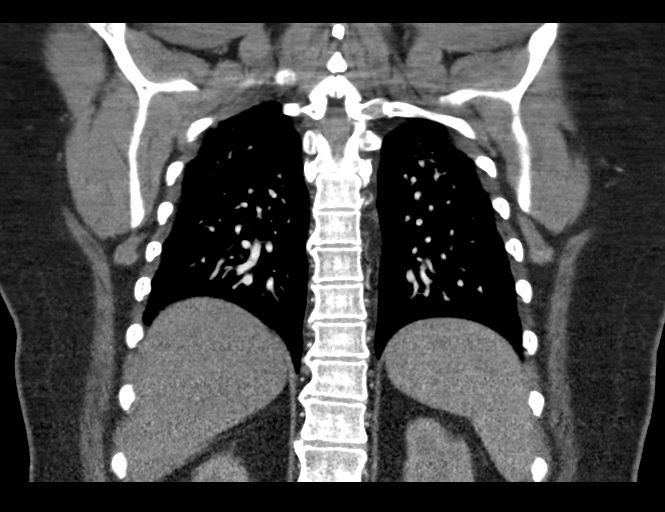

[18 of 46 positions shown; findings below may reference images not displayed]

FINDINGS: Cardiovascular: There are no filling defects within the pulmonary
arteries to suggest pulmonary embolus. Thoracic aorta is normal in
caliber. No dissection or acute aortic findings. Minimal aortic
atherosclerosis. Heart is upper normal in size. There are coronary
artery calcifications. No pericardial effusion.

Mediastinum/Nodes: Previous prominent lymph nodes have improved in
the interim. There is no mediastinal or hilar adenopathy.
Decompressed esophagus.

Lungs/Pleura: Bilateral COVID pneumonia has resolved from prior
exam. No scarring or residual. No acute airspace disease. No pleural
fluid. No findings of pulmonary edema. Trachea and central bronchi
are patent.

Upper Abdomen: Suspected hepatic steatosis. Cholecystectomy.
Ingested material distends the stomach.

Musculoskeletal: There are no acute or suspicious osseous
abnormalities. Mild midthoracic spondylosis with spurring.

Review of the MIP images confirms the above findings.
IMPRESSION: 1. No pulmonary embolus or acute intrathoracic abnormality.
2. Coronary artery calcifications.

Aortic Atherosclerosis ([id number]).

## 2022-04-20 IMAGING — CR DG CHEST 2V
1 series · 2 of 2 positions shown · non-contrast
Comparison: 04/28/2020

CLINICAL DATA: Chest pain and shortness of breath.

EXAM:
CHEST - 2 VIEW

[Series 1: dg chest 2 view · 0.14mm/px · 2 of 2 slices shown]
[im 1/2]
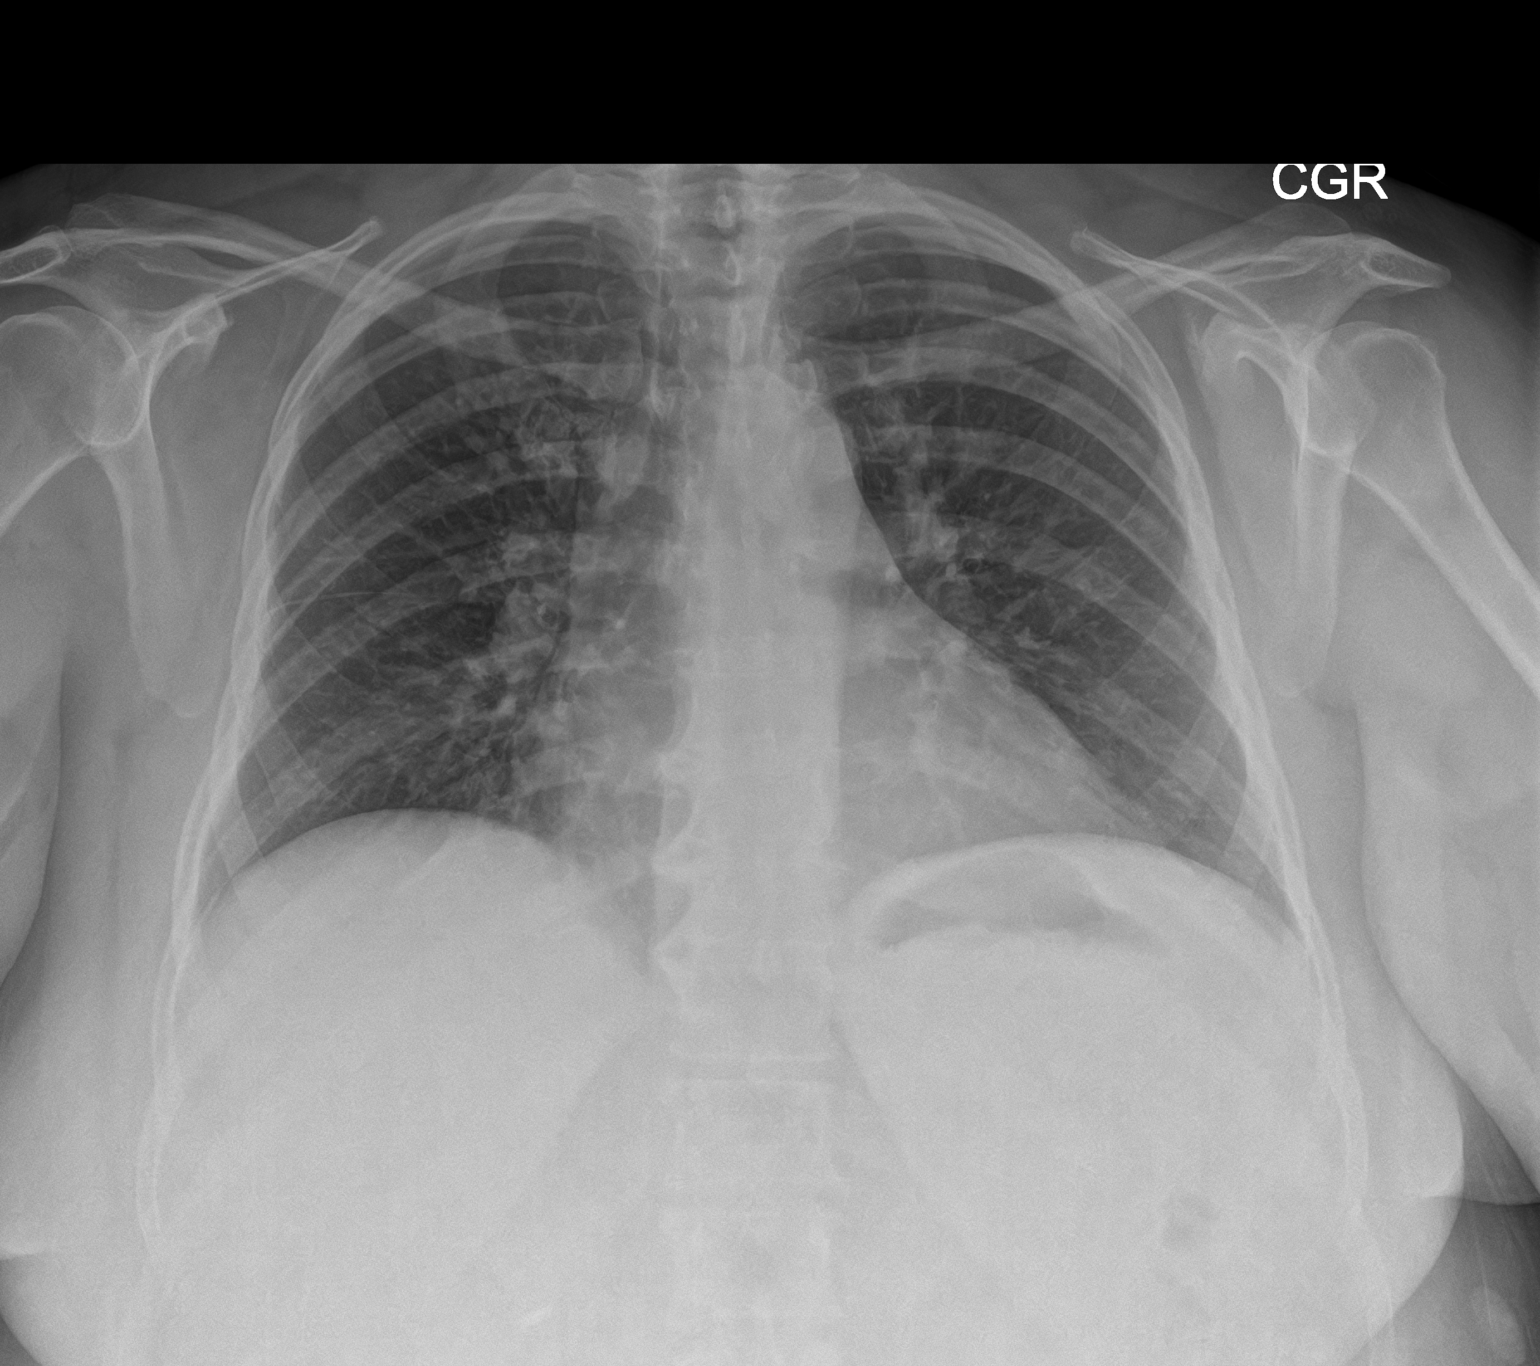
[im 2/2]
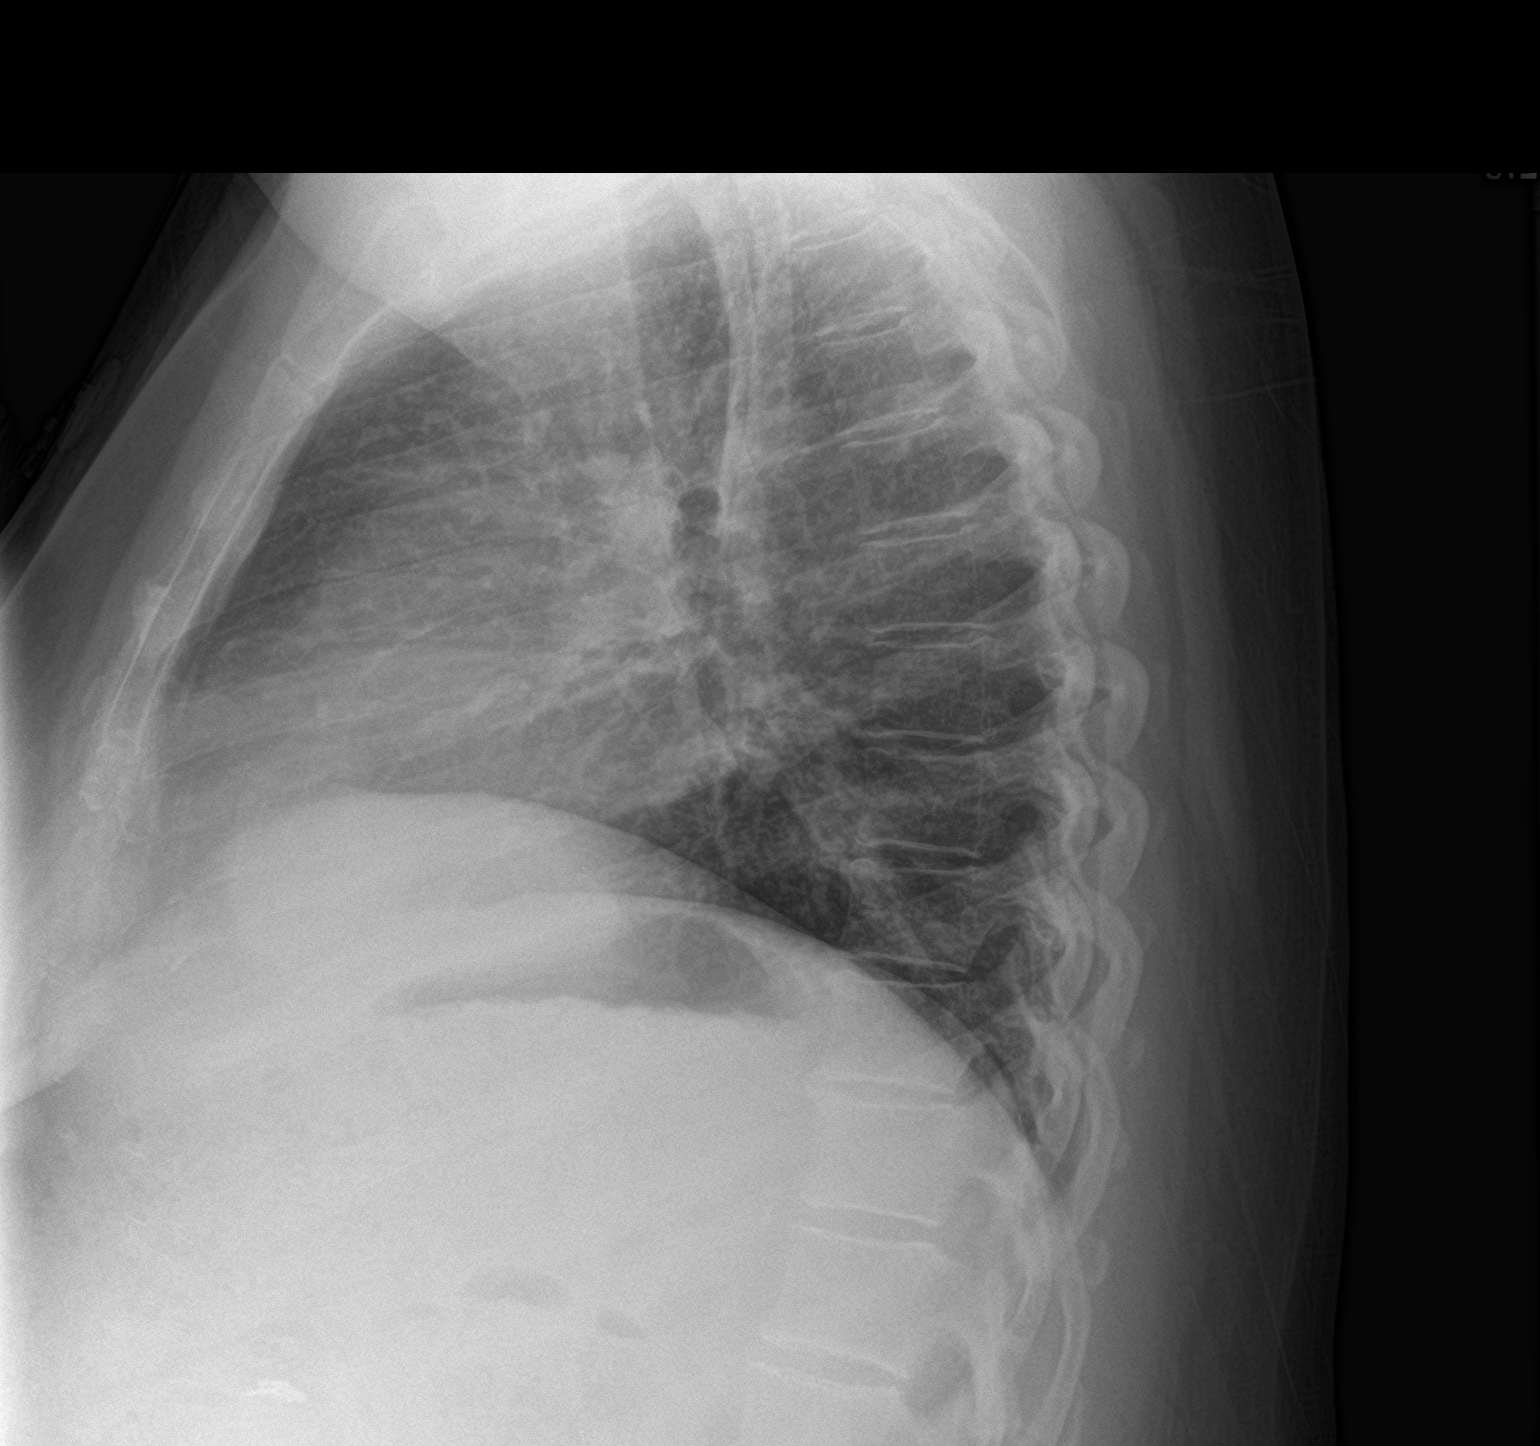

[2 of 2 positions shown; findings below may reference images not displayed]

FINDINGS: Heart size is normal. Central bronchial thickening suggesting
bronchitis. No infiltrate, collapse or effusion. No acute bone
finding.
IMPRESSION: Possible bronchitis. No consolidation or collapse.

## 2022-04-26 ENCOUNTER — Emergency Department: Payer: Medicare Other

## 2022-04-26 ENCOUNTER — Emergency Department
Admission: EM | Admit: 2022-04-26 | Discharge: 2022-04-26 | Discharge status: Against Medical Advice | Payer: Medicare Other | Attending: Student in an Organized Health Care Education/Training Program | Admitting: Student in an Organized Health Care Education/Training Program

## 2022-04-26 ENCOUNTER — Other Ambulatory Visit: Payer: Self-pay

## 2022-04-26 DIAGNOSIS — Z043 Encounter for examination and observation following other accident: Secondary | ICD-10-CM | POA: Diagnosis not present

## 2022-04-26 DIAGNOSIS — Z5321 Procedure and treatment not carried out due to patient leaving prior to being seen by health care provider: Secondary | ICD-10-CM | POA: Insufficient documentation

## 2022-04-26 DIAGNOSIS — E1165 Type 2 diabetes mellitus with hyperglycemia: Secondary | ICD-10-CM | POA: Diagnosis not present

## 2022-04-26 DIAGNOSIS — I639 Cerebral infarction, unspecified: Secondary | ICD-10-CM | POA: Insufficient documentation

## 2022-04-26 DIAGNOSIS — I10 Essential (primary) hypertension: Secondary | ICD-10-CM | POA: Diagnosis not present

## 2022-04-26 DIAGNOSIS — E039 Hypothyroidism, unspecified: Secondary | ICD-10-CM | POA: Diagnosis not present

## 2022-04-26 DIAGNOSIS — Z7901 Long term (current) use of anticoagulants: Secondary | ICD-10-CM | POA: Diagnosis not present

## 2022-04-26 DIAGNOSIS — E7800 Pure hypercholesterolemia, unspecified: Secondary | ICD-10-CM | POA: Diagnosis not present

## 2022-04-26 LAB — CBC WITH DIFFERENTIAL/PLATELET
Abs Immature Granulocytes: 0.09 10*3/uL — ABNORMAL HIGH (ref 0.00–0.07)
Basophils Absolute: 0 10*3/uL (ref 0.0–0.1)
Basophils Relative: 1 %
Eosinophils Absolute: 0.2 10*3/uL (ref 0.0–0.5)
Eosinophils Relative: 3 %
HCT: 39.3 % (ref 36.0–46.0)
Hemoglobin: 12.5 g/dL (ref 12.0–15.0)
Immature Granulocytes: 1 %
Lymphocytes Relative: 33 %
Lymphs Abs: 2.7 10*3/uL (ref 0.7–4.0)
MCH: 29.3 pg (ref 26.0–34.0)
MCHC: 31.8 g/dL (ref 30.0–36.0)
MCV: 92.3 fL (ref 80.0–100.0)
Monocytes Absolute: 0.7 10*3/uL (ref 0.1–1.0)
Monocytes Relative: 9 %
Neutro Abs: 4.3 10*3/uL (ref 1.7–7.7)
Neutrophils Relative %: 53 %
Platelets: 279 10*3/uL (ref 150–400)
RBC: 4.26 MIL/uL (ref 3.87–5.11)
RDW: 14.6 % (ref 11.5–15.5)
WBC: 8.1 10*3/uL (ref 4.0–10.5)
nRBC: 0 % (ref 0.0–0.2)

## 2022-04-26 LAB — PROTIME-INR
INR: 1.1 (ref 0.8–1.2)
Prothrombin Time: 14.3 seconds (ref 11.4–15.2)

## 2022-04-26 LAB — COMPREHENSIVE METABOLIC PANEL
ALT: 32 U/L (ref 0–44)
AST: 35 U/L (ref 15–41)
Albumin: 3.9 g/dL (ref 3.5–5.0)
Alkaline Phosphatase: 39 U/L (ref 38–126)
Anion gap: 8 (ref 5–15)
BUN: 48 mg/dL — ABNORMAL HIGH (ref 6–20)
CO2: 25 mmol/L (ref 22–32)
Calcium: 10.9 mg/dL — ABNORMAL HIGH (ref 8.9–10.3)
Chloride: 105 mmol/L (ref 98–111)
Creatinine, Ser: 1.68 mg/dL — ABNORMAL HIGH (ref 0.44–1.00)
GFR, Estimated: 35 mL/min — ABNORMAL LOW (ref >60)
Glucose, Bld: 177 mg/dL — ABNORMAL HIGH (ref 70–99)
Potassium: 4.6 mmol/L (ref 3.5–5.1)
Sodium: 138 mmol/L (ref 135–145)
Total Bilirubin: 0.8 mg/dL (ref 0.3–1.2)
Total Protein: 6.4 g/dL — ABNORMAL LOW (ref 6.5–8.1)

## 2022-04-26 LAB — APTT: aPTT: 27 seconds (ref 24–36)

## 2022-04-26 NOTE — ED Triage Notes (Signed)
Pt coming from home with recent fall, 2 days ago. Pt also stating they are having memory difficulties, trouble with walking. Pt stating they had a ministroke- last year. LKW was 2 day ago. Pt takes plavix for Afib.

## 2022-04-27 ENCOUNTER — Ambulatory Visit (INDEPENDENT_AMBULATORY_CARE_PROVIDER_SITE_OTHER): Payer: Medicare Other | Admitting: Family Medicine

## 2022-04-27 ENCOUNTER — Encounter: Payer: Self-pay | Admitting: Family Medicine

## 2022-04-27 VITALS — BP 132/70 | HR 84 | Resp 16 | Ht 59.0 in | Wt 191.0 lb

## 2022-04-27 DIAGNOSIS — R42 Dizziness and giddiness: Secondary | ICD-10-CM | POA: Diagnosis not present

## 2022-04-27 DIAGNOSIS — M898X1 Other specified disorders of bone, shoulder: Secondary | ICD-10-CM | POA: Diagnosis not present

## 2022-04-27 DIAGNOSIS — Z9181 History of falling: Secondary | ICD-10-CM

## 2022-04-27 DIAGNOSIS — M25511 Pain in right shoulder: Secondary | ICD-10-CM

## 2022-04-27 MED ORDER — BACLOFEN 10 MG PO TABS: 10.0000 mg | ORAL_TABLET | Freq: Three times a day (TID) | ORAL | 0 refills | Status: DC | PRN

## 2022-04-27 NOTE — Progress Notes (Signed)
Name: [name]   MRN: [id number]    DOB: [birth year]   Date:04/27/2022       Progress Note  Subjective  Chief Complaint  Shoulder Pain  HPI  Vertigo: continues to have problems with spinning sensation, sensation that rooms spins around her, associated with slurred speech, headache, and gait disturbance, first episode was in June. She went to [facility] 02/10/2022 during first episode and was seen here by Dr. Ky Barban once. Episodes can last a few hours to a couple of days. Last episode started two days , she went to [facility] last night but left AMA. Symptoms resolved now. She has an appointment with [facility] on 05/11/2022   She had multiple CT heads and also CT angio that were normal but unable to get MRI - she has a Instem to control her bladder   During the last episode of vertigo she fell two days ago on right side of her body and hit right shoulder on the floor , did not hit her head of passed out. She has developed pain on right shoulder blade and noticed a bruise on right shoulder. She also has pain with rom Pain is intermittent from zero to 10, aggravated with  movement    Patient Active Problem List   Diagnosis Date Noted   Vertigo 02/10/2022   Abnormal urinalysis 02/10/2022   TIA (transient ischemic attack) 10/04/2021   HTN (hypertension) 10/04/2021   Chronic kidney disease, stage 3a ([place]) 10/04/2021   Depression with anxiety 10/04/2021   COPD (chronic obstructive pulmonary disease) ([place]) 10/04/2021   CAD (coronary artery disease) 10/04/2021   Chronic diastolic CHF (congestive heart failure) ([place]) 10/04/2021   Type II diabetes mellitus with renal manifestations ([place]) 06/30/2021   Interstitial cystitis 06/30/2021   Atherosclerosis of aorta ([employer]) 03/28/2021   Iron deficiency anemia due to chronic blood loss    Polyp of ascending colon    Vitamin B12 deficiency 08/08/2018   Patient is full code 10/30/2017   Coronary artery disease of native artery of native heart with stable  angina pectoris ([place]) 10/09/2017   Plantar fascial fibromatosis 10/09/2017   Tendinitis of wrist 10/09/2017   Schizoaffective disorder, bipolar type ([place]) 09/28/2017   Adenomatous colon polyp 10/15/2015   Bipolar affective disorder ([place]) 10/15/2015   CN (constipation) 10/15/2015   HLD (hyperlipidemia) 10/15/2015   NASH (nonalcoholic steatohepatitis) 10/15/2015   Obesity (BMI 35.0-39.9 without comorbidity) 10/15/2015   Restless leg syndrome 10/15/2015   Low back pain with left-sided sciatica 08/18/2015   Apnea, sleep 05/05/2015   Heart valve disease 05/05/2015   CKD stage 3 due to type 2 diabetes mellitus ([place]) 03/15/2015   Migraine 03/12/2015   Chronic bronchitis ([place]) 01/20/2015   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[place]) 01/20/2015   GERD (gastroesophageal reflux disease) 01/20/2015   Hypothyroid    Vitamin D deficiency    Urinary incontinence    Liver lesion    Hypomagnesemia    Obstructive sleep apnea 10/20/2013   Allergic rhinitis 07/04/2007    Past Surgical History:  Procedure Laterality Date   ABDOMINAL HYSTERECTOMY     Partial   bladder stim  2007   BLADDER SURGERY     x 2. tacking of bladder and the other was the stimulator placement   CHOLECYSTECTOMY     COLONOSCOPY WITH PROPOFOL N/A 03/04/2019   Procedure: COLONOSCOPY WITH PROPOFOL;  Surgeon: [name], MD;  Location: [facility] ENDOSCOPY;  Service: Endoscopy;  Laterality: N/A;   ESOPHAGOGASTRODUODENOSCOPY (EGD) WITH PROPOFOL N/A 03/04/2019  Procedure: ESOPHAGOGASTRODUODENOSCOPY (EGD) WITH PROPOFOL;  Surgeon: [name], MD;  Location: [facility] ENDOSCOPY;  Service: Endoscopy;  Laterality: N/A;   INCISION AND DRAINAGE ABSCESS Right 04/16/2019   Procedure: INCISION AND DRAINAGE ABSCESS;  Surgeon: Benjamine Sprague, DO;  Location: [facility] ORS;  Service: General;  Laterality: Right;   INTERSTIM-GENERATOR CHANGE N/A 12/31/2017   Procedure: INTERSTIM-GENERATOR CHANGE;  Surgeon: Bjorn Loser, MD;  Location: [facility] ORS;  Service: Urology;   Laterality: N/A;   LIVER BIOPSY  March 2016   Negative   LUNG BIOPSY  2015   small spot on lung   TUBAL LIGATION     UPPER GI ENDOSCOPY     WRIST SURGERY Right 11/02/2019    Family History  Problem Relation Age of Onset   Diabetes Mother    Heart disease Mother    Hyperlipidemia Mother    Hypertension Mother    Kidney disease Mother    Mental illness Mother    Hypothyroidism Mother    Stroke Mother    Osteoporosis Mother    Glaucoma Mother    Congestive Heart Failure Mother    Hypertension Father    Kidney disease Brother    Intracerebral hemorrhage Brother    Breast cancer Maternal Grandmother 20   Heart disease Maternal Grandfather    Rheum arthritis Maternal Grandfather    Cancer Maternal Grandfather        liver   Kidney disease Maternal Grandfather    Cancer Paternal Grandmother        lung   Asthma Daughter    Cancer Daughter        throat   Bipolar disorder Daughter    Bipolar disorder Daughter    Hypertension Daughter    Restless legs syndrome Daughter     Social History   Tobacco Use   Smoking status: Never   Smokeless tobacco: Never  Substance Use Topics   Alcohol use: No     Current Outpatient Medications:    atorvastatin (LIPITOR) 80 MG tablet, Take 80 mg by mouth at bedtime. , Disp: , Rfl:    busPIRone (BUSPAR) 30 MG tablet, Take 30 mg by mouth 2 (two) times daily., Disp: , Rfl:    cetirizine (ZYRTEC) 10 MG tablet, Take 1 tablet (10 mg total) by mouth daily., Disp: 30 tablet, Rfl: 0   citalopram (CELEXA) 20 MG tablet, Take 20 mg by mouth daily., Disp: , Rfl:    clopidogrel (PLAVIX) 75 MG tablet, Take 1 tablet (75 mg total) by mouth daily., Disp: 30 tablet, Rfl: 11   esomeprazole (NEXIUM) 40 MG capsule, TAKE 1 CAPSULE BY MOUTH IN THE MORNING, Disp: 30 capsule, Rfl: 9   ezetimibe (ZETIA) 10 MG tablet, Take 10 mg by mouth at bedtime., Disp: , Rfl:    fenofibrate micronized (LOFIBRA) 134 MG capsule, Take 134 mg by mouth at bedtime., Disp: , Rfl:     fluticasone (FLONASE) 50 MCG/ACT nasal spray, PLACE 2 SPRAYS INTO BOTH NOSTRILS DAILY, Disp: 16 g, Rfl: 0   gabapentin (NEURONTIN) 300 MG capsule, Take 300 mg by mouth 2 (two) times daily., Disp: , Rfl:    gabapentin (NEURONTIN) 600 MG tablet, Take 600 mg by mouth at bedtime., Disp: , Rfl:    GLYXAMBI 25-5 MG TABS, Take 1 tablet by mouth daily., Disp: , Rfl:    HUMULIN R U-500 KWIKPEN 500 UNIT/ML kwikpen, Inject 70 Units into the skin 3 (three) times daily with meals., Disp: , Rfl:    Insulin Aspart FlexPen (NOVOLOG) 100 UNIT/ML,  Inject into the skin., Disp: , Rfl:    Insulin Pen Needle (PEN NEEDLES) 31G X 8 MM MISC, , Disp: , Rfl:    ketoconazole (NIZORAL) 2 % cream, Apply 1 application. topically daily., Disp: 60 g, Rfl: 1   lamoTRIgine (LAMICTAL) 200 MG tablet, Take 200 mg by mouth 2 (two) times daily. , Disp: , Rfl:    linaclotide (LINZESS) 145 MCG CAPS capsule, Take 1 capsule (145 mcg total) by mouth daily before breakfast., Disp: 30 capsule, Rfl: 5   lisinopril (ZESTRIL) 2.5 MG tablet, TAKE 1 TABLET BY MOUTH DAILY, Disp: 90 tablet, Rfl: 0   meclizine (ANTIVERT) 25 MG tablet, Take 1 tablet (25 mg total) by mouth 3 (three) times daily as needed for dizziness or nausea., Disp: 30 tablet, Rfl: 0   nitroGLYCERIN (NITROSTAT) 0.4 MG SL tablet, Place 1 tablet (0.4 mg total) under the tongue every 5 (five) minutes as needed for chest pain., Disp: 25 tablet, Rfl: 0   OneTouch Delica Lancets 65K MISC, by Does not apply route., Disp: , Rfl:    promethazine (PHENERGAN) 12.5 MG tablet, Take 1 tablet (12.5 mg total) by mouth every 6 (six) hours as needed., Disp: 30 tablet, Rfl: 1   promethazine (PHENERGAN) 25 MG suppository, Place 1 suppository (25 mg total) rectally every 6 (six) hours as needed for nausea or vomiting., Disp: 12 each, Rfl: 0   QUEtiapine (SEROQUEL) 300 MG tablet, Take 300 mg by mouth at bedtime. Along with 219m dose for a total of 5029m Disp: , Rfl:    SUMAtriptan (IMITREX) 100 MG  tablet, May repeat in 2 hours if headache persists or recurs., Disp: 10 tablet, Rfl: 0   SYNTHROID 100 MCG tablet, Take 100 mcg by mouth daily., Disp: , Rfl:    TRULICITY 1.5 MGPT/4.6FKOPN, Inject 1.5 mg into the skin once a week. (Wednesdays), Disp: , Rfl:    Ubrogepant (UBRELVY) 100 MG TABS, Take 1 tablet by mouth every other day., Disp: 16 tablet, Rfl: 5  Allergies  Allergen Reactions   Mirtazapine Anaphylaxis    REACTION: closes throat   Morphine And Related     anaphylaxis   Sulfamethoxazole-Trimethoprim Rash    REACTION: [place] all over   Amoxicillin Er Nausea And Vomiting    Pt doesn't remember problem with amoxicillin -  SE Has patient had a PCN reaction causing immediate rash, facial/tongue/throat swelling, SOB or lightheadedness with hypotension: No Has patient had a PCN reaction causing severe rash involving mucus membranes or skin necrosis: No Has patient had a PCN reaction that required hospitalization: No Has patient had a PCN reaction occurring within the last 10 years: Yes If all of the above answers are "NO", then may proceed with Cephalosporin use.   Codeine Itching    REACTION: itch, rash   Hydrocodone-Acetaminophen Rash    REACTION: rash   Keflex [Cephalexin] Rash    Pt does not remember having a reaction or rash, no airway involvement   Prasterone Nausea And Vomiting    I personally reviewed active problem list, medication list, allergies, family history, social history, health maintenance with the patient/caregiver today.   ROS  Ten systems reviewed and is negative except as mentioned in HPI  Objective  Vitals:   04/27/22 1042  BP: 132/70  Pulse: 84  Resp: 16  SpO2: 96%  Weight: 191 lb (86.6 kg)  Height: 4' 11"  (1.499 m)    Body mass index is 38.58 kg/m.  Physical Exam  Constitutional: Patient appears well-developed and  well-nourished. Obese  No distress.  HEENT: head atraumatic, normocephalic, pupils equal and reactive to light, neck  supple, throat within normal limits Cardiovascular: Normal rate, regular rhythm and normal heart sounds.  No murmur heard. No BLE edema. Pulmonary/Chest: Effort normal and breath sounds normal. No respiratory distress. Abdominal: Soft.  There is no tenderness. Muscular skeletal: pain with abduction   of right shoulder, pain with empty can sign, bruise on right anterior shoulder, pain on muscles over the right scapular and right below it, refused trigger point with lidocaine Psychiatric: Patient has a normal mood and affect. behavior is normal. Judgment and thought content normal.    PHQ2/9:    04/27/2022   10:42 AM 02/21/2022   12:51 PM 02/02/2022    2:14 PM 12/28/2021   12:01 PM 12/28/2021   11:41 AM  Depression screen PHQ 2/9  Decreased Interest 2 1 1 1  0  Down, Depressed, Hopeless 1 2 1 1  0  PHQ - 2 Score 3 3 2 2  0  Altered sleeping 0 0 1 1   Tired, decreased energy 0 1 3 3    Change in appetite 1 1 0 1   Feeling bad or failure about yourself  0 0 0 0   Trouble concentrating 0 0 1 1   Moving slowly or fidgety/restless 0 0 1 1   Suicidal thoughts 0 0 0 0   PHQ-9 Score 4 5 8 9    Difficult doing work/chores  Somewhat difficult Somewhat difficult Somewhat difficult     phq 9 is positive   Fall Risk:    04/27/2022   10:42 AM 02/21/2022   12:50 PM 02/02/2022    2:19 PM 12/28/2021   11:41 AM 11/03/2021    9:27 AM  Fall Risk   Falls in the past year? 1 1 1 1  0  Number falls in past yr: 1 1 1 1  0  Injury with Fall? 0 0 0 0 0  Risk for fall due to : Impaired balance/gait History of fall(s);Impaired balance/gait History of fall(s)    Follow up Falls prevention discussed Falls evaluation completed Falls prevention discussed Falls prevention discussed;Education provided Falls evaluation completed      Functional Status Survey: Is the patient deaf or have difficulty hearing?: No Does the patient have difficulty seeing, even when wearing glasses/contacts?: Yes Does the patient have  difficulty concentrating, remembering, or making decisions?: Yes Does the patient have difficulty walking or climbing stairs?: No Does the patient have difficulty dressing or bathing?: No Does the patient have difficulty doing errands alone such as visiting a doctor's office or shopping?: No    Assessment & Plan  1. Vertigo  Keep follow up with neurologist   2. Acute pain of right shoulder  Also having muscular pain around scapula we will try baclofen prn, also advised topical medication  - DG Scapula Right; Future - DG Shoulder Right; Future - baclofen (LIORESAL) 10 MG tablet; Take 1 tablet (10 mg total) by mouth 3 (three) times daily as needed for muscle spasms.  Dispense: 30 each; Refill: 0  3. History of recent fall  - DG Scapula Right; Future - DG Shoulder Right; Future  4. Pain of right scapula  - DG Scapula Right; Future - DG Shoulder Right; Future - baclofen (LIORESAL) 10 MG tablet; Take 1 tablet (10 mg total) by mouth 3 (three) times daily as needed for muscle spasms.  Dispense: 30 each; Refill: 0

## 2022-04-27 NOTE — ED Notes (Addendum)
Pt placed on 2L [place]- pt stating they only wear o2 to sleep. Pt denies shob at this time.

## 2022-05-02 ENCOUNTER — Other Ambulatory Visit: Payer: Self-pay | Admitting: Family Medicine

## 2022-05-02 DIAGNOSIS — J3489 Other specified disorders of nose and nasal sinuses: Secondary | ICD-10-CM

## 2022-05-03 DIAGNOSIS — D631 Anemia in chronic kidney disease: Secondary | ICD-10-CM | POA: Diagnosis not present

## 2022-05-03 DIAGNOSIS — E039 Hypothyroidism, unspecified: Secondary | ICD-10-CM | POA: Diagnosis not present

## 2022-05-03 DIAGNOSIS — E7800 Pure hypercholesterolemia, unspecified: Secondary | ICD-10-CM | POA: Diagnosis not present

## 2022-05-03 DIAGNOSIS — I10 Essential (primary) hypertension: Secondary | ICD-10-CM | POA: Diagnosis not present

## 2022-05-05 ENCOUNTER — Encounter: Payer: Self-pay | Admitting: Family Medicine

## 2022-05-11 ENCOUNTER — Ambulatory Visit: Payer: Medicare HMO | Admitting: Neurology

## 2022-06-02 ENCOUNTER — Other Ambulatory Visit: Payer: Self-pay | Admitting: Family Medicine

## 2022-06-09 ENCOUNTER — Other Ambulatory Visit: Payer: Self-pay | Admitting: Family Medicine

## 2022-06-09 DIAGNOSIS — F411 Generalized anxiety disorder: Secondary | ICD-10-CM | POA: Diagnosis not present

## 2022-06-09 DIAGNOSIS — J3489 Other specified disorders of nose and nasal sinuses: Secondary | ICD-10-CM

## 2022-06-09 DIAGNOSIS — F250 Schizoaffective disorder, bipolar type: Secondary | ICD-10-CM | POA: Diagnosis not present

## 2022-06-09 NOTE — Telephone Encounter (Signed)
Requested Prescriptions  Pending Prescriptions Disp Refills  . fluticasone (FLONASE) 50 MCG/ACT nasal spray [Pharmacy Med Name: FLUTICASONE PROPIONATE 50 MCG/ACT N] 16 g 2    Sig: PLACE 2 SPRAYS INTO BOTH NOSTRILS DAILY     Ear, Nose, and Throat: Nasal Preparations - Corticosteroids Passed - 06/09/2022 12:26 PM      Passed - Valid encounter within last 12 months    Recent Outpatient Visits          1 month ago Vertigo   [facility] Steele Sizer, MD   3 months ago Vertigo   [facility] Rory Percy M, DO   5 months ago Poorly controlled type 2 diabetes mellitus with neuropathy [facility])   [facility] Steele Sizer, MD   7 months ago Viral upper respiratory tract infection   [place], FNP   9 months ago Acute non-recurrent pansinusitis   [facility] Bo Merino, FNP      Future Appointments            In 2 weeks Steele Sizer, MD [facility], [facility]

## 2022-06-21 NOTE — Progress Notes (Deleted)
Name: [name]   MRN: [id number]    DOB: 07-02-1966   Date:06/21/2022       Progress Note  Subjective  Chief Complaint  Follow Up  HPI  RLS: she states she has been on Gabapentin  for a long time. She has an urge to move it at night, both feet feels on fire , sometimes also jumps during the day but mostly at night.  It causes difficulty for her to fall asleep. She has not been taking magnesium lately and advised her to resume it   Intertrigo : under breast and groin, worse spot in on left abdominal fold, itchy, redness no oozing, using topical neosporin but is not helping. She states heat makes it worse.    Chronic bronchitis/hypoxemia/OSA  she states positive for OSA however unable to find a mask that fits her face and is only on nocturnal oxygen 2 liters. . She also has chronic bronchitis ( second hand smoker) denies daily cough and states no longer having SOB . Not currently on medication    GERD: seeing Dr. Allen Norris , she takes PPI and prn promethazine    DMII: seeing Dr. Ronnald Collum, A1C down from 10.6 % in January 2022 and January 2023 it was 7.7 % but during hospital stay it was up to 8.3 %. She states glucose has been higher than usual since brother died 11-28-2022 th - on the day of his birthday due to hemorrhagic stroke. She has episodes of  polyphagia, polydipsia or polyuria. She is complaint with her medications. She is on multiple doses of insulin, Trulicity  and oral medication, we placed referral to endo but she states now that glucose is good she wants to stay with Dr. Ammie Dalton . She has obesity , dyslipidemia , CKI and neuropathy. She was reminded to have yearly eye exam.  She is on ACE, statin therapy and gabapentin for neuropathy.    Hypothyroidism: managed by Dr. Ronnald Collum, she denies dysphagia, no dry skin, no change in bowel movements . Last TSH was low but she states Dr. Ronnald Collum told her to continue same dose, unchanged     Schizoaffective disorder/Bipolar : current under the  care of [facility]  , she is compliant with medications and visits. She states mood has been stable, denies any hypomania or mania,  she has been sad lately, grieving the loss of her brother - died 2021-11-27   CAD/angina : echo normal EF, she is on medical management, seeing Dr. Rockey Situ. Denies chest pain , she is on medical management and symptoms of angina controlled . Unchanged    Vitamin B12 and Vtiamin D deficiency: advised her to resume supplementation.   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 Lanora Manis , she has GFR monitored by Dr. Ronnald Collum and GFR usually around 40's she is on ARB . Denies generalized pruritis    Atherosclerosis of aorta: continue medications, on statin therapy, fenofibrate  and zetia, LDL has been at goal, continue medications    Migraine headaches: episodes varies depending on stress, currently having episodes twice weekly. . She states Imitrex works well for her , discussed adding preventive medication. We will try Ubrelvy. She describes headaches as aching or  tightness, has photophobia and phonophobia  She states occasionally associated with nausea    Patient Active Problem List   Diagnosis Date Noted   Vertigo 02/10/2022   Abnormal urinalysis 02/10/2022   TIA (transient ischemic attack) 10/04/2021   HTN (hypertension) 10/04/2021   Chronic kidney disease, stage 3a ([place]) 10/04/2021   Depression with anxiety 10/04/2021   COPD (chronic obstructive pulmonary disease) ([place]) 10/04/2021   CAD (coronary artery disease) 10/04/2021   Chronic diastolic CHF (congestive heart failure) ([place]) 10/04/2021   Type II diabetes mellitus with renal manifestations ([place]) 06/30/2021   Interstitial cystitis 06/30/2021   Atherosclerosis of aorta ([place])  03/28/2021   Iron deficiency anemia due to chronic blood loss    Polyp of ascending colon    Vitamin B12 deficiency 08/08/2018   Patient is full code 10/30/2017   Coronary artery disease of native artery of native heart with stable angina pectoris ([place]) 10/09/2017   Plantar fascial fibromatosis 10/09/2017   Tendinitis of wrist 10/09/2017   Schizoaffective disorder, bipolar type ([place]) 09/28/2017   Adenomatous colon polyp 10/15/2015   Bipolar affective disorder ([place]) 10/15/2015   CN (constipation) 10/15/2015   HLD (hyperlipidemia) 10/15/2015   NASH (nonalcoholic steatohepatitis) 10/15/2015   Obesity (BMI 35.0-39.9 without comorbidity) 10/15/2015   Restless leg syndrome 10/15/2015   Low back pain with left-sided sciatica 08/18/2015   Apnea, sleep 05/05/2015   Heart valve disease 05/05/2015   CKD stage 3 due to type 2 diabetes mellitus ([place]) 03/15/2015   Migraine 03/12/2015   Chronic bronchitis ([place]) 01/20/2015   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[place]) 01/20/2015   GERD (gastroesophageal reflux disease) 01/20/2015   Hypothyroid    Vitamin D deficiency    Urinary incontinence    Liver lesion    Hypomagnesemia    Obstructive sleep apnea 10/20/2013   Allergic rhinitis 07/04/2007    Past Surgical History:  Procedure Laterality Date   ABDOMINAL HYSTERECTOMY     Partial   bladder stim  2007   BLADDER SURGERY     x 2. tacking of bladder and the other was the stimulator placement   CHOLECYSTECTOMY     COLONOSCOPY WITH PROPOFOL N/A 03/04/2019   Procedure: COLONOSCOPY WITH PROPOFOL;  Surgeon: [name], MD;  Location: [facility] ENDOSCOPY;  Service: Endoscopy;  Laterality: N/A;   ESOPHAGOGASTRODUODENOSCOPY (EGD) WITH PROPOFOL N/A 03/04/2019   Procedure: ESOPHAGOGASTRODUODENOSCOPY (EGD) WITH PROPOFOL;  Surgeon: [name], MD;  Location: [facility] ENDOSCOPY;  Service: Endoscopy;  Laterality: N/A;   INCISION AND DRAINAGE ABSCESS Right 04/16/2019   Procedure: INCISION AND DRAINAGE ABSCESS;  Surgeon:  Benjamine Sprague, DO;  Location: [facility] ORS;  Service: General;  Laterality: Right;   INTERSTIM-GENERATOR CHANGE N/A 12/31/2017   Procedure: INTERSTIM-GENERATOR CHANGE;  Surgeon: Bjorn Loser, MD;  Location: [facility] ORS;  Service: Urology;  Laterality: N/A;   LIVER BIOPSY  March 2016   Negative   LUNG BIOPSY  2015   small spot on lung   TUBAL LIGATION     UPPER GI ENDOSCOPY     WRIST SURGERY Right 11/02/2019    Family History  Problem Relation Age of Onset   Diabetes Mother    Heart disease Mother    Hyperlipidemia Mother    Hypertension Mother    Kidney disease Mother    Mental illness Mother    Hypothyroidism Mother    Stroke Mother    Osteoporosis Mother    Glaucoma Mother    Congestive Heart Failure Mother    Hypertension Father    Kidney disease  Brother    Intracerebral hemorrhage Brother    Breast cancer Maternal Grandmother 22   Heart disease Maternal Grandfather    Rheum arthritis Maternal Grandfather    Cancer Maternal Grandfather        liver   Kidney disease Maternal Grandfather    Cancer Paternal Grandmother        lung   Asthma Daughter    Cancer Daughter        throat   Bipolar disorder Daughter    Bipolar disorder Daughter    Hypertension Daughter    Restless legs syndrome Daughter     Social History   Tobacco Use   Smoking status: Never   Smokeless tobacco: Never  Substance Use Topics   Alcohol use: No     Current Outpatient Medications:    atorvastatin (LIPITOR) 80 MG tablet, Take 80 mg by mouth at bedtime. , Disp: , Rfl:    baclofen (LIORESAL) 10 MG tablet, Take 1 tablet (10 mg total) by mouth 3 (three) times daily as needed for muscle spasms., Disp: 30 each, Rfl: 0   busPIRone (BUSPAR) 30 MG tablet, Take 30 mg by mouth 2 (two) times daily., Disp: , Rfl:    cetirizine (ZYRTEC) 10 MG tablet, Take 1 tablet (10 mg total) by mouth daily., Disp: 30 tablet, Rfl: 0   citalopram (CELEXA) 20 MG tablet, Take 20 mg by mouth daily., Disp: , Rfl:     clopidogrel (PLAVIX) 75 MG tablet, Take 1 tablet (75 mg total) by mouth daily., Disp: 30 tablet, Rfl: 11   esomeprazole (NEXIUM) 40 MG capsule, TAKE 1 CAPSULE BY MOUTH IN THE MORNING, Disp: 30 capsule, Rfl: 9   ezetimibe (ZETIA) 10 MG tablet, Take 10 mg by mouth at bedtime., Disp: , Rfl:    fenofibrate micronized (LOFIBRA) 134 MG capsule, Take 134 mg by mouth at bedtime., Disp: , Rfl:    fluticasone (FLONASE) 50 MCG/ACT nasal spray, PLACE 2 SPRAYS INTO BOTH NOSTRILS DAILY, Disp: 16 g, Rfl: 2   gabapentin (NEURONTIN) 300 MG capsule, Take 300 mg by mouth 2 (two) times daily., Disp: , Rfl:    gabapentin (NEURONTIN) 600 MG tablet, Take 600 mg by mouth at bedtime., Disp: , Rfl:    GLYXAMBI 25-5 MG TABS, Take 1 tablet by mouth daily., Disp: , Rfl:    HUMULIN R U-500 KWIKPEN 500 UNIT/ML kwikpen, Inject 70 Units into the skin 3 (three) times daily with meals., Disp: , Rfl:    Insulin Aspart FlexPen (NOVOLOG) 100 UNIT/ML, Inject into the skin., Disp: , Rfl:    Insulin Pen Needle (PEN NEEDLES) 31G X 8 MM MISC, , Disp: , Rfl:    ketoconazole (NIZORAL) 2 % cream, Apply 1 application. topically daily., Disp: 60 g, Rfl: 1   lamoTRIgine (LAMICTAL) 200 MG tablet, Take 200 mg by mouth 2 (two) times daily. , Disp: , Rfl:    linaclotide (LINZESS) 145 MCG CAPS capsule, Take 1 capsule (145 mcg total) by mouth daily before breakfast., Disp: 30 capsule, Rfl: 5   lisinopril (ZESTRIL) 2.5 MG tablet, TAKE 1 TABLET BY MOUTH DAILY, Disp: 90 tablet, Rfl: 0   meclizine (ANTIVERT) 25 MG tablet, TAKE 1 TABLET BY MOUTH 3 TIMES DAILY AS NEEDED FOR DIZZINESS OR NAUSEA, Disp: 10 tablet, Rfl: 0   nitroGLYCERIN (NITROSTAT) 0.4 MG SL tablet, Place 1 tablet (0.4 mg total) under the tongue every 5 (five) minutes as needed for chest pain., Disp: 25 tablet, Rfl: 0   OneTouch Delica Lancets 58N MISC,  by Does not apply route., Disp: , Rfl:    promethazine (PHENERGAN) 12.5 MG tablet, Take 1 tablet (12.5 mg total) by mouth every 6 (six) hours  as needed., Disp: 30 tablet, Rfl: 1   promethazine (PHENERGAN) 25 MG suppository, Place 1 suppository (25 mg total) rectally every 6 (six) hours as needed for nausea or vomiting., Disp: 12 each, Rfl: 0   QUEtiapine (SEROQUEL) 300 MG tablet, Take 300 mg by mouth at bedtime. Along with 236m dose for a total of 501m Disp: , Rfl:    SUMAtriptan (IMITREX) 100 MG tablet, May repeat in 2 hours if headache persists or recurs., Disp: 10 tablet, Rfl: 0   SYNTHROID 100 MCG tablet, Take 100 mcg by mouth daily., Disp: , Rfl:    TRULICITY 1.5 MGRS/8.5IOOPN, Inject 1.5 mg into the skin once a week. (Wednesdays), Disp: , Rfl:    Ubrogepant (UBRELVY) 100 MG TABS, Take 1 tablet by mouth every other day., Disp: 16 tablet, Rfl: 5  Allergies  Allergen Reactions   Mirtazapine Anaphylaxis    REACTION: closes throat   Morphine And Related     anaphylaxis   Sulfamethoxazole-Trimethoprim Rash    REACTION: Whelps all over   Amoxicillin Er Nausea And Vomiting    Pt doesn't remember problem with amoxicillin -  SE Has patient had a PCN reaction causing immediate rash, facial/tongue/throat swelling, SOB or lightheadedness with hypotension: No Has patient had a PCN reaction causing severe rash involving mucus membranes or skin necrosis: No Has patient had a PCN reaction that required hospitalization: No Has patient had a PCN reaction occurring within the last 10 years: Yes If all of the above answers are "NO", then may proceed with Cephalosporin use.   Codeine Itching    REACTION: itch, rash   Hydrocodone-Acetaminophen Rash    REACTION: rash   Keflex [Cephalexin] Rash    Pt does not remember having a reaction or rash, no airway involvement   Prasterone Nausea And Vomiting    I personally reviewed active problem list, medication list, allergies, family history, social history, health maintenance with the patient/caregiver today.   ROS  ***  Objective  There were no vitals filed for this visit.  There is  no height or weight on file to calculate BMI.  Physical Exam ***  Recent Results (from the past 2160 hour(s))  Comprehensive metabolic panel     Status: Abnormal   Collection Time: 04/26/22  5:06 PM  Result Value Ref Range   Sodium 138 135 - 145 mmol/L   Potassium 4.6 3.5 - 5.1 mmol/L   Chloride 105 98 - 111 mmol/L   CO2 25 22 - 32 mmol/L   Glucose, Bld 177 (H) 70 - 99 mg/dL    Comment: Glucose reference range applies only to samples taken after fasting for at least 8 hours.   BUN 48 (H) 6 - 20 mg/dL   Creatinine, Ser 1.68 (H) 0.44 - 1.00 mg/dL   Calcium 10.9 (H) 8.9 - 10.3 mg/dL   Total Protein 6.4 (L) 6.5 - 8.1 g/dL   Albumin 3.9 3.5 - 5.0 g/dL   AST 35 15 - 41 U/L   ALT 32 0 - 44 U/L   Alkaline Phosphatase 39 38 - 126 U/L   Total Bilirubin 0.8 0.3 - 1.2 mg/dL   GFR, Estimated 35 (L) >60 mL/min    Comment: (NOTE) Calculated using the CKD-EPI Creatinine Equation (2021)    Anion gap 8 5 - 15    Comment:  Performed at [facility], [address] [place], [place] [address]  CBC with Differential     Status: Abnormal   Collection Time: 04/26/22  5:06 PM  Result Value Ref Range   WBC 8.1 4.0 - 10.5 K/uL   RBC 4.26 3.87 - 5.11 MIL/uL   Hemoglobin 12.5 12.0 - 15.0 g/dL   HCT 39.3 36.0 - 46.0 %   MCV 92.3 80.0 - 100.0 fL   MCH 29.3 26.0 - 34.0 pg   MCHC 31.8 30.0 - 36.0 g/dL   RDW 14.6 11.5 - 15.5 %   Platelets 279 150 - 400 K/uL   nRBC 0.0 0.0 - 0.2 %   Neutrophils Relative % 53 %   Neutro Abs 4.3 1.7 - 7.7 K/uL   Lymphocytes Relative 33 %   Lymphs Abs 2.7 0.7 - 4.0 K/uL   Monocytes Relative 9 %   Monocytes Absolute 0.7 0.1 - 1.0 K/uL   Eosinophils Relative 3 %   Eosinophils Absolute 0.2 0.0 - 0.5 K/uL   Basophils Relative 1 %   Basophils Absolute 0.0 0.0 - 0.1 K/uL   Immature Granulocytes 1 %   Abs Immature Granulocytes 0.09 (H) 0.00 - 0.07 K/uL    Comment: Performed at [facility], [address] [place], [place] [address]  Protime-INR      Status: None   Collection Time: 04/26/22  5:06 PM  Result Value Ref Range   Prothrombin Time 14.3 11.4 - 15.2 seconds   INR 1.1 0.8 - 1.2    Comment: (NOTE) INR goal varies based on device and disease states. Performed at [facility], [address] [place], [place] [address]   APTT     Status: None   Collection Time: 04/26/22  5:06 PM  Result Value Ref Range   aPTT 27 24 - 36 seconds    Comment: Performed at [facility], [address] [place],  [address]    Diabetic Foot Exam: Diabetic Foot Exam - Simple   No data filed    ***  PHQ2/9:    04/27/2022   10:42 AM 02/21/2022   12:51 PM 02/02/2022    2:14 PM 12/28/2021   12:01 PM 12/28/2021   11:41 AM  Depression screen PHQ 2/9  Decreased Interest 2 1 1 1  0  Down, Depressed, Hopeless 1 2 1 1  0  PHQ - 2 Score 3 3 2 2  0  Altered sleeping 0 0 1 1   Tired, decreased energy 0 1 3 3    Change in appetite 1 1 0 1   Feeling bad or failure about yourself  0 0 0 0   Trouble concentrating 0 0 1 1   Moving slowly or fidgety/restless 0 0 1 1   Suicidal thoughts 0 0 0 0   PHQ-9 Score 4 5 8 9    Difficult doing work/chores  Somewhat difficult Somewhat difficult Somewhat difficult     phq 9 is {gen pos [id number]}   Fall Risk:    04/27/2022   10:42 AM 02/21/2022   12:50 PM 02/02/2022    2:19 PM 12/28/2021   11:41 AM 11/03/2021    9:27 AM  Fall Risk   Falls in the past year? 1 1 1 1  0  Number falls in past yr: 1 1 1 1  0  Injury with Fall? 0 0 0 0 0  Risk for fall due to : Impaired balance/gait History of fall(s);Impaired balance/gait History of fall(s)    Follow  up Falls prevention discussed Falls evaluation completed Falls prevention discussed Falls prevention discussed;Education provided Falls evaluation completed      Functional Status Survey:      Assessment & Plan  *** There are no diagnoses linked to this encounter.

## 2022-06-23 ENCOUNTER — Ambulatory Visit: Payer: Medicare Other | Admitting: Family Medicine

## 2022-06-23 DIAGNOSIS — E1169 Type 2 diabetes mellitus with other specified complication: Secondary | ICD-10-CM

## 2022-06-28 DIAGNOSIS — E1165 Type 2 diabetes mellitus with hyperglycemia: Secondary | ICD-10-CM | POA: Diagnosis not present

## 2022-06-28 DIAGNOSIS — E039 Hypothyroidism, unspecified: Secondary | ICD-10-CM | POA: Diagnosis not present

## 2022-06-28 DIAGNOSIS — E7800 Pure hypercholesterolemia, unspecified: Secondary | ICD-10-CM | POA: Diagnosis not present

## 2022-06-28 DIAGNOSIS — I10 Essential (primary) hypertension: Secondary | ICD-10-CM | POA: Diagnosis not present

## 2022-07-02 ENCOUNTER — Other Ambulatory Visit: Payer: Self-pay | Admitting: Family Medicine

## 2022-07-03 MED ORDER — LISINOPRIL 2.5 MG PO TABS: 2.5000 mg | ORAL_TABLET | Freq: Every day | ORAL | 0 refills | Status: DC

## 2022-07-03 NOTE — Addendum Note (Signed)
Addended by: Matilde Sprang on: 07/03/2022 02:09 PM   Modules accepted: Orders

## 2022-07-03 NOTE — Telephone Encounter (Signed)
Courtesy refill given, appointment needed.  Requested Prescriptions  Pending Prescriptions Disp Refills  . lisinopril (ZESTRIL) 2.5 MG tablet 10 tablet 0    Sig: Take 1 tablet (2.5 mg total) by mouth daily.     Cardiovascular:  ACE Inhibitors Failed - 07/03/2022  2:09 PM      Failed - Cr in normal range and within 180 days    Creat  Date Value Ref Range Status  02/14/2021 1.37 (H) 0.50 - 1.05 mg/dL Final    Comment:    For patients >58 years of age, the reference limit for Creatinine is approximately 13% higher for people identified as African-American. .    Creatinine, Ser  Date Value Ref Range Status  04/26/2022 1.68 (H) 0.44 - 1.00 mg/dL Final   Creatinine,U  Date Value Ref Range Status  06/08/2008 93.0 mg/dL Final   Creatinine, Urine  Date Value Ref Range Status  10/09/2017 72 20 - 275 mg/dL Final         Passed - K in normal range and within 180 days    Potassium  Date Value Ref Range Status  04/26/2022 4.6 3.5 - 5.1 mmol/L Final  10/09/2014 4.7 3.5 - 5.1 mmol/L Final         Passed - Patient is not pregnant      Passed - Last BP in normal range    BP Readings from Last 1 Encounters:  04/27/22 132/70         Passed - Valid encounter within last 6 months    Recent Outpatient Visits          2 months ago [facility] Steele Sizer, MD   4 months ago Vertigo   [facility] Rory Percy M, DO   6 months ago Poorly controlled type 2 diabetes mellitus with neuropathy [facility])   [facility] Steele Sizer, MD   8 months ago Viral upper respiratory tract infection   [place], FNP   9 months ago Acute non-recurrent pansinusitis   [facility] Bo Merino, FNP      Future Appointments            In 1 week Steele Sizer, MD [facility], [place]  . lisinopril  (ZESTRIL) 2.5 MG tablet [Pharmacy Med Name: LISINOPRIL 2.5 MG TAB] 90 tablet 0    Sig: TAKE 1 TABLET BY MOUTH DAILY     Cardiovascular:  ACE Inhibitors Failed - 07/03/2022  2:09 PM      Failed - Cr in normal range and within 180 days    Creat  Date Value Ref Range Status  02/14/2021 1.37 (H) 0.50 - 1.05 mg/dL Final    Comment:    For patients >77 years of age, the reference limit for Creatinine is approximately 13% higher for people identified as African-American. .    Creatinine, Ser  Date Value Ref Range Status  04/26/2022 1.68 (H) 0.44 - 1.00 mg/dL Final   Creatinine,U  Date Value Ref Range Status  06/08/2008 93.0 mg/dL Final   Creatinine, Urine  Date Value Ref Range Status  10/09/2017 72 20 - 275 mg/dL Final         Passed - K in normal range and within 180 days    Potassium  Date Value Ref Range Status  04/26/2022 4.6 3.5 - 5.1  mmol/L Final  10/09/2014 4.7 3.5 - 5.1 mmol/L Final         Passed - Patient is not pregnant      Passed - Last BP in normal range    BP Readings from Last 1 Encounters:  04/27/22 132/70         Passed - Valid encounter within last 6 months    Recent Outpatient Visits          2 months ago [facility] Steele Sizer, MD   4 months ago Vertigo   [facility] Rory Percy M, DO   6 months ago Poorly controlled type 2 diabetes mellitus with neuropathy [facility])   [facility] Steele Sizer, MD   8 months ago Viral upper respiratory tract infection   [place], FNP   9 months ago Acute non-recurrent pansinusitis   [facility] Bo Merino, FNP      Future Appointments            In 1 week Steele Sizer, MD [facility], [facility]

## 2022-07-03 NOTE — Telephone Encounter (Signed)
Pt is calling to check on the status of medication refill? Please advise [phone]- [phone]

## 2022-07-03 NOTE — Telephone Encounter (Signed)
Patient called and advised lisinopril refused by provider due to needing an appointment, she asked if it could be on the phone, advised office is best so the BP could be checked. Appt scheduled, patient out of lisinopril, advised will send in refill to last until appt.

## 2022-07-04 DIAGNOSIS — E7800 Pure hypercholesterolemia, unspecified: Secondary | ICD-10-CM | POA: Diagnosis not present

## 2022-07-04 DIAGNOSIS — D631 Anemia in chronic kidney disease: Secondary | ICD-10-CM | POA: Diagnosis not present

## 2022-07-04 DIAGNOSIS — E6609 Other obesity due to excess calories: Secondary | ICD-10-CM | POA: Diagnosis not present

## 2022-07-04 DIAGNOSIS — K760 Fatty (change of) liver, not elsewhere classified: Secondary | ICD-10-CM | POA: Diagnosis not present

## 2022-07-12 ENCOUNTER — Ambulatory Visit: Payer: Medicare Other | Admitting: Family Medicine

## 2022-07-18 ENCOUNTER — Other Ambulatory Visit: Payer: Self-pay | Admitting: Family Medicine

## 2022-07-18 ENCOUNTER — Other Ambulatory Visit: Payer: Self-pay | Admitting: Gastroenterology

## 2022-07-18 DIAGNOSIS — G43009 Migraine without aura, not intractable, without status migrainosus: Secondary | ICD-10-CM

## 2022-07-19 NOTE — Telephone Encounter (Signed)
Pt does not have transportation at the moment and will call back next week to set up this appt

## 2022-07-31 ENCOUNTER — Other Ambulatory Visit: Payer: Self-pay | Admitting: Family Medicine

## 2022-08-09 ENCOUNTER — Other Ambulatory Visit: Payer: Self-pay | Admitting: Family Medicine

## 2022-08-29 ENCOUNTER — Emergency Department: Payer: Medicare Other

## 2022-08-29 ENCOUNTER — Emergency Department
Admission: EM | Admit: 2022-08-29 | Discharge: 2022-08-29 | Disposition: A | Discharge status: Home / Self Care | Payer: Medicare Other | Attending: Student in an Organized Health Care Education/Training Program | Admitting: Student in an Organized Health Care Education/Training Program

## 2022-08-29 ENCOUNTER — Other Ambulatory Visit: Payer: Self-pay

## 2022-08-29 DIAGNOSIS — J449 Chronic obstructive pulmonary disease, unspecified: Secondary | ICD-10-CM | POA: Diagnosis not present

## 2022-08-29 DIAGNOSIS — J101 Influenza due to other identified influenza virus with other respiratory manifestations: Secondary | ICD-10-CM | POA: Insufficient documentation

## 2022-08-29 DIAGNOSIS — R42 Dizziness and giddiness: Secondary | ICD-10-CM | POA: Diagnosis not present

## 2022-08-29 DIAGNOSIS — R531 Weakness: Secondary | ICD-10-CM | POA: Diagnosis not present

## 2022-08-29 DIAGNOSIS — R5381 Other malaise: Secondary | ICD-10-CM | POA: Diagnosis present

## 2022-08-29 DIAGNOSIS — R4182 Altered mental status, unspecified: Secondary | ICD-10-CM | POA: Diagnosis not present

## 2022-08-29 DIAGNOSIS — Z20822 Contact with and (suspected) exposure to covid-19: Secondary | ICD-10-CM | POA: Diagnosis not present

## 2022-08-29 DIAGNOSIS — R296 Repeated falls: Secondary | ICD-10-CM | POA: Diagnosis not present

## 2022-08-29 DIAGNOSIS — R070 Pain in throat: Secondary | ICD-10-CM | POA: Diagnosis not present

## 2022-08-29 DIAGNOSIS — I959 Hypotension, unspecified: Secondary | ICD-10-CM | POA: Diagnosis not present

## 2022-08-29 DIAGNOSIS — R55 Syncope and collapse: Secondary | ICD-10-CM | POA: Diagnosis not present

## 2022-08-29 DIAGNOSIS — R059 Cough, unspecified: Secondary | ICD-10-CM | POA: Diagnosis not present

## 2022-08-29 DIAGNOSIS — W19XXXA Unspecified fall, initial encounter: Secondary | ICD-10-CM | POA: Diagnosis not present

## 2022-08-29 DIAGNOSIS — S0990XA Unspecified injury of head, initial encounter: Secondary | ICD-10-CM | POA: Diagnosis not present

## 2022-08-29 LAB — CBC WITH DIFFERENTIAL/PLATELET
Abs Immature Granulocytes: 0.05 10*3/uL (ref 0.00–0.07)
Basophils Absolute: 0 10*3/uL (ref 0.0–0.1)
Basophils Relative: 1 %
Eosinophils Absolute: 0 10*3/uL (ref 0.0–0.5)
Eosinophils Relative: 0 %
HCT: 37.5 % (ref 36.0–46.0)
Hemoglobin: 11.8 g/dL — ABNORMAL LOW (ref 12.0–15.0)
Immature Granulocytes: 1 %
Lymphocytes Relative: 28 %
Lymphs Abs: 1.3 10*3/uL (ref 0.7–4.0)
MCH: 29.7 pg (ref 26.0–34.0)
MCHC: 31.5 g/dL (ref 30.0–36.0)
MCV: 94.5 fL (ref 80.0–100.0)
Monocytes Absolute: 0.5 10*3/uL (ref 0.1–1.0)
Monocytes Relative: 12 %
Neutro Abs: 2.6 10*3/uL (ref 1.7–7.7)
Neutrophils Relative %: 58 %
Platelets: 164 10*3/uL (ref 150–400)
RBC: 3.97 MIL/uL (ref 3.87–5.11)
RDW: 14.4 % (ref 11.5–15.5)
WBC: 4.4 10*3/uL (ref 4.0–10.5)
nRBC: 0 % (ref 0.0–0.2)

## 2022-08-29 LAB — BASIC METABOLIC PANEL
Anion gap: 11 (ref 5–15)
BUN: 30 mg/dL — ABNORMAL HIGH (ref 6–20)
CO2: 27 mmol/L (ref 22–32)
Calcium: 9 mg/dL (ref 8.9–10.3)
Chloride: 102 mmol/L (ref 98–111)
Creatinine, Ser: 1.52 mg/dL — ABNORMAL HIGH (ref 0.44–1.00)
GFR, Estimated: 40 mL/min — ABNORMAL LOW (ref >60)
Glucose, Bld: 189 mg/dL — ABNORMAL HIGH (ref 70–99)
Potassium: 4.4 mmol/L (ref 3.5–5.1)
Sodium: 140 mmol/L (ref 135–145)

## 2022-08-29 LAB — URINALYSIS, ROUTINE W REFLEX MICROSCOPIC
Bacteria, UA: NONE SEEN
Bilirubin Urine: NEGATIVE
Glucose, UA: >=500 mg/dL — AB
Hgb urine dipstick: NEGATIVE
Ketones, ur: 20 mg/dL — AB
Leukocytes,Ua: NEGATIVE
Nitrite: NEGATIVE
Protein, ur: NEGATIVE mg/dL
Specific Gravity, Urine: 1.028 (ref 1.005–1.030)
pH: 5 (ref 5.0–8.0)

## 2022-08-29 LAB — RESP PANEL BY RT-PCR (RSV, FLU A&B, COVID)  RVPGX2
Influenza A by PCR: NEGATIVE
Influenza B by PCR: POSITIVE — AB
Resp Syncytial Virus by PCR: NEGATIVE
SARS Coronavirus 2 by RT PCR: NEGATIVE

## 2022-08-29 LAB — TROPONIN I (HIGH SENSITIVITY): Troponin I (High Sensitivity): 14 ng/L (ref <18)

## 2022-08-29 MED ORDER — ONDANSETRON 4 MG PO TBDP: 4.0000 mg | ORAL_TABLET | Freq: Three times a day (TID) | ORAL | 0 refills | Status: DC | PRN

## 2022-08-29 MED ORDER — ONDANSETRON 4 MG PO TBDP
4.0000 mg | ORAL_TABLET | Freq: Once | ORAL | Status: AC
  Administered 2022-08-29: 4 mg via ORAL
  Filled 2022-08-29: qty 1

## 2022-08-29 NOTE — ED Triage Notes (Signed)
Pt comes via EMs from home  with c/o multiple falls and 3 today. Pt did hit head twice today. Pt not on thinners. Pt denies any loc. Pt states increased weakness. Possible UTI with foul odor.

## 2022-08-29 NOTE — ED Provider Triage Note (Signed)
Emergency Medicine Provider Triage Evaluation Note  [name] , a 56 y.o. female  was evaluated in triage.  Pt complains of 3 falls today. Reports dizzy, like world spinning since yesterday. Reports that she feels that way often. Nausea, no vomiting. Reports HS and LOC prior to call. No CP. No palpitations.   Review of Systems  Positive: Weakness, falls, nausea, cough, syncope Negative: Abd pain, vomiting, diarrhea  Physical Exam  There were no vitals taken for this visit. Gen:   Awake, no distress   Resp:  Normal effort  MSK:   Moves extremities without difficulty  Other:    Medical Decision Making  Medically screening exam initiated at 3:25 PM.  Appropriate orders placed.  [name] was informed that the remainder of the evaluation will be completed by another provider, this initial triage assessment does not replace that evaluation, and the importance of remaining in the ED until their evaluation is complete.     Marquette Old, PA-C 08/29/22 [id number]

## 2022-08-31 ENCOUNTER — Emergency Department: Payer: Medicare Other

## 2022-08-31 ENCOUNTER — Other Ambulatory Visit: Payer: Self-pay

## 2022-08-31 ENCOUNTER — Encounter: Payer: Self-pay | Admitting: Emergency Medicine

## 2022-08-31 ENCOUNTER — Emergency Department
Admission: EM | Admit: 2022-08-31 | Discharge: 2022-08-31 | Disposition: A | Discharge status: Home / Self Care | Payer: Medicare Other | Attending: Emergency Medicine | Admitting: Emergency Medicine

## 2022-08-31 DIAGNOSIS — Z20822 Contact with and (suspected) exposure to covid-19: Secondary | ICD-10-CM | POA: Diagnosis not present

## 2022-08-31 DIAGNOSIS — N189 Chronic kidney disease, unspecified: Secondary | ICD-10-CM | POA: Diagnosis not present

## 2022-08-31 DIAGNOSIS — J101 Influenza due to other identified influenza virus with other respiratory manifestations: Secondary | ICD-10-CM | POA: Diagnosis not present

## 2022-08-31 DIAGNOSIS — R059 Cough, unspecified: Secondary | ICD-10-CM | POA: Diagnosis not present

## 2022-08-31 DIAGNOSIS — E119 Type 2 diabetes mellitus without complications: Secondary | ICD-10-CM | POA: Diagnosis not present

## 2022-08-31 DIAGNOSIS — I130 Hypertensive heart and chronic kidney disease with heart failure and stage 1 through stage 4 chronic kidney disease, or unspecified chronic kidney disease: Secondary | ICD-10-CM | POA: Diagnosis not present

## 2022-08-31 DIAGNOSIS — J45909 Unspecified asthma, uncomplicated: Secondary | ICD-10-CM | POA: Diagnosis not present

## 2022-08-31 DIAGNOSIS — R42 Dizziness and giddiness: Secondary | ICD-10-CM | POA: Diagnosis not present

## 2022-08-31 DIAGNOSIS — J449 Chronic obstructive pulmonary disease, unspecified: Secondary | ICD-10-CM | POA: Insufficient documentation

## 2022-08-31 DIAGNOSIS — R531 Weakness: Secondary | ICD-10-CM | POA: Insufficient documentation

## 2022-08-31 DIAGNOSIS — R509 Fever, unspecified: Secondary | ICD-10-CM | POA: Diagnosis not present

## 2022-08-31 DIAGNOSIS — I509 Heart failure, unspecified: Secondary | ICD-10-CM | POA: Diagnosis not present

## 2022-08-31 LAB — CBC
HCT: 33.6 % — ABNORMAL LOW (ref 36.0–46.0)
Hemoglobin: 10.8 g/dL — ABNORMAL LOW (ref 12.0–15.0)
MCH: 29.1 pg (ref 26.0–34.0)
MCHC: 32.1 g/dL (ref 30.0–36.0)
MCV: 90.6 fL (ref 80.0–100.0)
Platelets: 158 10*3/uL (ref 150–400)
RBC: 3.71 MIL/uL — ABNORMAL LOW (ref 3.87–5.11)
RDW: 14.3 % (ref 11.5–15.5)
WBC: 4.8 10*3/uL (ref 4.0–10.5)
nRBC: 0 % (ref 0.0–0.2)

## 2022-08-31 LAB — RESP PANEL BY RT-PCR (RSV, FLU A&B, COVID)  RVPGX2
Influenza A by PCR: NEGATIVE
Influenza B by PCR: POSITIVE — AB
Resp Syncytial Virus by PCR: NEGATIVE
SARS Coronavirus 2 by RT PCR: NEGATIVE

## 2022-08-31 LAB — BASIC METABOLIC PANEL
Anion gap: 13 (ref 5–15)
BUN: 41 mg/dL — ABNORMAL HIGH (ref 6–20)
CO2: 22 mmol/L (ref 22–32)
Calcium: 8.5 mg/dL — ABNORMAL LOW (ref 8.9–10.3)
Chloride: 99 mmol/L (ref 98–111)
Creatinine, Ser: 1.54 mg/dL — ABNORMAL HIGH (ref 0.44–1.00)
GFR, Estimated: 39 mL/min — ABNORMAL LOW (ref >60)
Glucose, Bld: 148 mg/dL — ABNORMAL HIGH (ref 70–99)
Potassium: 4.2 mmol/L (ref 3.5–5.1)
Sodium: 134 mmol/L — ABNORMAL LOW (ref 135–145)

## 2022-08-31 MED ORDER — ONDANSETRON HCL 4 MG/2ML IJ SOLN
4.0000 mg | Freq: Once | INTRAMUSCULAR | Status: AC
  Administered 2022-08-31: 4 mg via INTRAVENOUS
  Filled 2022-08-31: qty 2

## 2022-08-31 MED ORDER — SODIUM CHLORIDE 0.9 % IV BOLUS
1000.0000 mL | Freq: Once | INTRAVENOUS | Status: AC
  Administered 2022-08-31: 1000 mL via INTRAVENOUS

## 2022-08-31 MED ORDER — SODIUM CHLORIDE 0.9 % IV SOLN: Freq: Once | INTRAVENOUS | Status: DC

## 2022-08-31 NOTE — ED Triage Notes (Signed)
Pt reports feels like she has no energy. Pt reports was dx'd with flu earlier this week and cannot get her fever to break and has no energy,

## 2022-08-31 NOTE — Discharge Instructions (Signed)
Drinks lots of fluids, tylenol for fever, headache or bodyaches. Follow up with your doctor if any continued problems.  Your flu symptoms can last 5-7 days.

## 2022-08-31 NOTE — ED Triage Notes (Addendum)
Pt placed on 2L of oxygen. Pt reports is typically not on oxygen

## 2022-08-31 NOTE — ED Provider Triage Note (Signed)
Emergency Medicine Provider Triage Evaluation Note  [name] , a 56 y.o. female  was evaluated in triage.  Pt complains of continued fever and weakness after being dx with flu 2 days ago.  Review of Systems  Positive: Fever, weak Negative:   Physical Exam  BP (!) 132/106 (BP Location: Left Arm)   Pulse 90   Temp 100.2 F (37.9 C) (Oral)   Resp 20   Ht 4' 11"  (1.499 m)   Wt 81.2 kg   SpO2 95%   BMI 36.15 kg/m  Gen:   Awake, no distress   Resp:  Normal effort  MSK:   Moves extremities without difficulty  Other:  Patient not normally on O2, but Sat in 80's per nursing staff before placed on O2 per nasal canula   Medical Decision Making  Medically screening exam initiated at 7:21 AM.  Appropriate orders placed.  [name] was informed that the remainder of the evaluation will be completed by another provider, this initial triage assessment does not replace that evaluation, and the importance of remaining in the ED until their evaluation is complete.     Stacey Hai, PA-C 08/31/22 [phone]

## 2022-08-31 NOTE — ED Provider Notes (Signed)
[facility] Provider Note    Event Date/Time   First MD Initiated Contact with Patient 08/31/22 [phone]     (approximate)   History   Weakness   HPI  [name] is a 56 y.o. female   presents to the ED with complaint of feeling as if she has no energy, weakness, fever and dizziness when she is walking.  Patient was diagnosed with the flu earlier this week.  She admits that she most likely is not drinking enough fluids and that she has been staying in bed.  She denies any vomiting or diarrhea but does endorse nausea at this time.  Patient has a history of type 2 diabetes, hypertension, sleep apnea, asthma, CHF, chronic kidney disease, COPD, dysrhythmia, heart attack 2009, bipolar disorder.      Physical Exam   Triage Vital Signs: ED Triage Vitals  Enc Vitals Group     BP 08/31/22 0716 (!) 132/106     Pulse Rate 08/31/22 0709 92     Resp 08/31/22 0709 20     Temp 08/31/22 0709 100.2 F (37.9 C)     Temp Source 08/31/22 0709 Oral     SpO2 08/31/22 0716 95 %     Weight 08/31/22 0709 179 lb (81.2 kg)     Height 08/31/22 0709 4' 11"  (1.499 m)     Head Circumference --      Peak Flow --      Pain Score 08/31/22 0709 0     Pain Loc --      Pain Edu? --      Excl. in [place]? --     Most recent vital signs: Vitals:   08/31/22 1151 08/31/22 1329  BP: (!) 119/107 (!) 120/100  Pulse: 95 96  Resp: 20 20  Temp: 100.2 F (37.9 C) 99 F (37.2 C)  SpO2: 97% 97%     General: Awake, no distress.  Alert, talkative.  Nontoxic in appearance. CV:  Good peripheral perfusion.  Heart regular rate and rhythm. Resp:  Normal effort.  Lungs are clear bilaterally. Abd:  No distention.  Soft, nontender. Other:     ED Results / Procedures / Treatments   Labs (all labs ordered are listed, but only abnormal results are displayed) Labs Reviewed  RESP PANEL BY RT-PCR (RSV, FLU A&B, COVID)  RVPGX2 - Abnormal; Notable for the following components:      Result Value    Influenza B by PCR POSITIVE (*)    All other components within normal limits  BASIC METABOLIC PANEL - Abnormal; Notable for the following components:   Sodium 134 (*)    Glucose, Bld 148 (*)    BUN 41 (*)    Creatinine, Ser 1.54 (*)    Calcium 8.5 (*)    GFR, Estimated 39 (*)    All other components within normal limits  CBC - Abnormal; Notable for the following components:   RBC 3.71 (*)    Hemoglobin 10.8 (*)    HCT 33.6 (*)    All other components within normal limits     EKG  Vent. rate 90 BPM PR interval 158 ms QRS duration 84 ms QT/QTcB 332/406 ms P-R-T axes 56 -26 34 Normal sinus rhythm Possible Anterior infarct (cited on or before 10-Feb-2022) Abnormal ECG When compared with ECG of 29-Aug-2022 15:34, No significant change was found  RADIOLOGY Chest x-ray images were reviewed and official radiology report indicates improvement in the left lower lung space  in comparison with other x-rays.    PROCEDURES:  Critical Care performed:   Procedures   MEDICATIONS ORDERED IN ED: Medications  0.9 %  sodium chloride infusion (has no administration in time range)  sodium chloride 0.9 % bolus 1,000 mL (0 mLs Intravenous Stopped 08/31/22 1326)  ondansetron (ZOFRAN) injection 4 mg (4 mg Intravenous Given 08/31/22 1023)     IMPRESSION / MDM / ASSESSMENT AND PLAN / ED COURSE  I reviewed the triage vital signs and the nursing notes.   Differential diagnosis includes, but is not limited to, influenza, COVID, pneumonia, bronchitis, RSV.  56 year old female presents to the ED with complaint of weakness, dizziness, continued fever and no energy.  Patient was diagnosed with flu earlier this week.  Respiratory panel is positive for influenza B.  Sodium mildly decreased at 134, glucose 148 nonfasting, BUN 41, creatinine 1.54, GFR estimated at 39.  WBC 4.8, hemoglobin 10.8 and hematocrit 33.6.  Chest x-ray per radiologist is reviewed and compared with earlier chest x-rays  which showed improvement of the left lower lung space.  Patient was reassured and patient was given Zofran IV along with a normal saline bolus 1 L.  Patient was feeling better prior to discharge.  She had a low-grade temp of 99, O2 sat was 97 on room air, patient is encouraged to continue with Tylenol for fever, drink fluids frequently, take her regular medications as prescribed by her doctor and follow-up with her PCP.      Patient's presentation is most consistent with acute complicated illness / injury requiring diagnostic workup.  FINAL CLINICAL IMPRESSION(S) / ED DIAGNOSES   Final diagnoses:  Influenza B     Rx / DC Orders   ED Discharge Orders     None        Note:  This document was prepared using Dragon voice recognition software and may include unintentional dictation errors.   Johnn Hai, PA-C 08/31/22 1410    Lavonia Drafts, MD 08/31/22 1450

## 2022-08-31 NOTE — ED Triage Notes (Signed)
Pt in via EMS from home with c/o weakness. Pt was seen in the ED on Tuesday and dx'd with flu and still can't get out of bed.  Temp 102

## 2022-09-09 NOTE — ED Provider Notes (Signed)
[facility] Provider Note    Event Date/Time   First MD Initiated Contact with Patient 08/29/22 2008     (approximate)   History   Fall   HPI  [name] is a 57 y.o. female presents to the ER with a history of COPD bipolar disorder with symptoms of a couple falls as well as generalized malaise and weakness.  Has had stronger smelling urine but no dysuria no flank pain.  Denies any shortness of breath.  Denies chest pain or pressure.  Does endorse feeling lightheaded particular with ambulation.     Physical Exam   Triage Vital Signs: ED Triage Vitals  Enc Vitals Group     BP 08/29/22 1526 (!) 153/99     Pulse Rate 08/29/22 1526 93     Resp 08/29/22 1526 18     Temp 08/29/22 1526 98.9 F (37.2 C)     Temp Source 08/29/22 1526 Oral     SpO2 08/29/22 1526 94 %     Weight --      Height --      Head Circumference --      Peak Flow --      Pain Score 08/29/22 1524 3     Pain Loc --      Pain Edu? --      Excl. in [place]? --     Most recent vital signs: Vitals:   08/29/22 1526 08/29/22 2039  BP: (!) 153/99 (!) 148/88  Pulse: 93 90  Resp: 18 19  Temp: 98.9 F (37.2 C) 98.7 F (37.1 C)  SpO2: 94% 95%     Constitutional: Alert  Eyes: Conjunctivae are normal.  Head: Atraumatic. Nose: No congestion/rhinnorhea. Mouth/Throat: Mucous membranes are moist.   Neck: Painless ROM.  Cardiovascular:   Good peripheral circulation. Respiratory: Normal respiratory effort.  No retractions.  Gastrointestinal: Soft and nontender.  Musculoskeletal:  no deformity Neurologic:  MAE spontaneously. No gross focal neurologic deficits are appreciated.  Skin:  Skin is warm, dry and intact. No rash noted. Psychiatric: Mood and affect are normal. Speech and behavior are normal.    ED Results / Procedures / Treatments   Labs (all labs ordered are listed, but only abnormal results are displayed) Labs Reviewed  RESP PANEL BY RT-PCR (RSV, FLU A&B, COVID)   RVPGX2 - Abnormal; Notable for the following components:      Result Value   Influenza B by PCR POSITIVE (*)    All other components within normal limits  BASIC METABOLIC PANEL - Abnormal; Notable for the following components:   Glucose, Bld 189 (*)    BUN 30 (*)    Creatinine, Ser 1.52 (*)    GFR, Estimated 40 (*)    All other components within normal limits  CBC WITH DIFFERENTIAL/PLATELET - Abnormal; Notable for the following components:   Hemoglobin 11.8 (*)    All other components within normal limits  URINALYSIS, ROUTINE W REFLEX MICROSCOPIC - Abnormal; Notable for the following components:   Color, Urine YELLOW (*)    APPearance CLEAR (*)    Glucose, UA >=500 (*)    Ketones, ur 20 (*)    All other components within normal limits  TROPONIN I (HIGH SENSITIVITY)     EKG  ED ECG REPORT I, Merlyn Lot, the attending physician, personally viewed and interpreted this ECG.   Date: 09/09/2022  EKG Time: 15:34  Rate: 95  Rhythm: sinus  Axis: left  Intervals:normal  ST&T Change:  no stemi, no depressions    RADIOLOGY Please see ED Course for my review and interpretation.  I personally reviewed all radiographic images ordered to evaluate for the above acute complaints and reviewed radiology reports and findings.  These findings were personally discussed with the patient.  Please see medical record for radiology report.    PROCEDURES:  Critical Care performed: No  Procedures   MEDICATIONS ORDERED IN ED: Medications  ondansetron (ZOFRAN-ODT) disintegrating tablet 4 mg (4 mg Oral Given 08/29/22 2038)     IMPRESSION / MDM / ASSESSMENT AND PLAN / ED COURSE  I reviewed the triage vital signs and the nursing notes.                              Differential diagnosis includes, but is not limited to, dehydration, electrolyte abnormality, URI, pneumonia, SDH, IPH, concussion, flu, COVID UTI  Patient presenting to the ER for evaluation of symptoms as described  above.  Based on symptoms, risk factors and considered above differential, this presenting complaint could reflect a potentially life-threatening illness therefore the patient will be placed on continuous pulse oximetry and telemetry for monitoring.  Laboratory evaluation will be sent to evaluate for the above complaints.  Chest x-ray my review and interpretation does not show any evidence of consolidation.  CT imaging ordered triage does not show any evidence of acute intracranial abnormality.  Patient test positive for flu.  Hemodynamically stable no hypoxia.  No wheezing on exam.  Does appear stable appropriate for outpatient management.     FINAL CLINICAL IMPRESSION(S) / ED DIAGNOSES   Final diagnoses:  Influenza B     Rx / DC Orders   ED Discharge Orders          Ordered    ondansetron (ZOFRAN-ODT) 4 MG disintegrating tablet  Every 8 hours PRN        08/29/22 2031             Note:  This document was prepared using Dragon voice recognition software and may include unintentional dictation errors.    Merlyn Lot, MD 09/09/22 [phone]

## 2022-09-10 ENCOUNTER — Emergency Department: Payer: Medicare Other

## 2022-09-10 ENCOUNTER — Inpatient Hospital Stay
Admission: EM | Admit: 2022-09-10 | Discharge: 2022-09-14 | DRG: 871 | Disposition: A | Discharge status: Home Health | Payer: Medicare Other | Attending: Internal Medicine | Admitting: Internal Medicine

## 2022-09-10 ENCOUNTER — Other Ambulatory Visit: Payer: Self-pay

## 2022-09-10 DIAGNOSIS — Z7902 Long term (current) use of antithrombotics/antiplatelets: Secondary | ICD-10-CM

## 2022-09-10 DIAGNOSIS — Z794 Long term (current) use of insulin: Secondary | ICD-10-CM | POA: Diagnosis not present

## 2022-09-10 DIAGNOSIS — G9340 Encephalopathy, unspecified: Secondary | ICD-10-CM

## 2022-09-10 DIAGNOSIS — Z825 Family history of asthma and other chronic lower respiratory diseases: Secondary | ICD-10-CM

## 2022-09-10 DIAGNOSIS — E440 Moderate protein-calorie malnutrition: Secondary | ICD-10-CM | POA: Diagnosis not present

## 2022-09-10 DIAGNOSIS — E1151 Type 2 diabetes mellitus with diabetic peripheral angiopathy without gangrene: Secondary | ICD-10-CM | POA: Diagnosis present

## 2022-09-10 DIAGNOSIS — K219 Gastro-esophageal reflux disease without esophagitis: Secondary | ICD-10-CM | POA: Diagnosis present

## 2022-09-10 DIAGNOSIS — R509 Fever, unspecified: Secondary | ICD-10-CM | POA: Diagnosis not present

## 2022-09-10 DIAGNOSIS — A4151 Sepsis due to Escherichia coli [E. coli]: Secondary | ICD-10-CM | POA: Diagnosis not present

## 2022-09-10 DIAGNOSIS — Z0389 Encounter for observation for other suspected diseases and conditions ruled out: Secondary | ICD-10-CM | POA: Diagnosis not present

## 2022-09-10 DIAGNOSIS — N390 Urinary tract infection, site not specified: Secondary | ICD-10-CM | POA: Diagnosis present

## 2022-09-10 DIAGNOSIS — Z1152 Encounter for screening for COVID-19: Secondary | ICD-10-CM | POA: Diagnosis not present

## 2022-09-10 DIAGNOSIS — N1831 Chronic kidney disease, stage 3a: Secondary | ICD-10-CM | POA: Diagnosis not present

## 2022-09-10 DIAGNOSIS — Z888 Allergy status to other drugs, medicaments and biological substances status: Secondary | ICD-10-CM

## 2022-09-10 DIAGNOSIS — E785 Hyperlipidemia, unspecified: Secondary | ICD-10-CM | POA: Diagnosis not present

## 2022-09-10 DIAGNOSIS — E1129 Type 2 diabetes mellitus with other diabetic kidney complication: Secondary | ICD-10-CM | POA: Diagnosis present

## 2022-09-10 DIAGNOSIS — B9620 Unspecified Escherichia coli [E. coli] as the cause of diseases classified elsewhere: Secondary | ICD-10-CM

## 2022-09-10 DIAGNOSIS — Z9049 Acquired absence of other specified parts of digestive tract: Secondary | ICD-10-CM | POA: Diagnosis not present

## 2022-09-10 DIAGNOSIS — I252 Old myocardial infarction: Secondary | ICD-10-CM

## 2022-09-10 DIAGNOSIS — K760 Fatty (change of) liver, not elsewhere classified: Secondary | ICD-10-CM | POA: Diagnosis not present

## 2022-09-10 DIAGNOSIS — Z833 Family history of diabetes mellitus: Secondary | ICD-10-CM

## 2022-09-10 DIAGNOSIS — E039 Hypothyroidism, unspecified: Secondary | ICD-10-CM | POA: Diagnosis not present

## 2022-09-10 DIAGNOSIS — G2581 Restless legs syndrome: Secondary | ICD-10-CM | POA: Diagnosis present

## 2022-09-10 DIAGNOSIS — Z803 Family history of malignant neoplasm of breast: Secondary | ICD-10-CM

## 2022-09-10 DIAGNOSIS — F250 Schizoaffective disorder, bipolar type: Secondary | ICD-10-CM | POA: Diagnosis not present

## 2022-09-10 DIAGNOSIS — E1122 Type 2 diabetes mellitus with diabetic chronic kidney disease: Secondary | ICD-10-CM | POA: Diagnosis not present

## 2022-09-10 DIAGNOSIS — I5032 Chronic diastolic (congestive) heart failure: Secondary | ICD-10-CM | POA: Diagnosis present

## 2022-09-10 DIAGNOSIS — D631 Anemia in chronic kidney disease: Secondary | ICD-10-CM | POA: Diagnosis not present

## 2022-09-10 DIAGNOSIS — G4733 Obstructive sleep apnea (adult) (pediatric): Secondary | ICD-10-CM | POA: Diagnosis present

## 2022-09-10 DIAGNOSIS — I10 Essential (primary) hypertension: Secondary | ICD-10-CM | POA: Diagnosis present

## 2022-09-10 DIAGNOSIS — I130 Hypertensive heart and chronic kidney disease with heart failure and stage 1 through stage 4 chronic kidney disease, or unspecified chronic kidney disease: Secondary | ICD-10-CM | POA: Diagnosis present

## 2022-09-10 DIAGNOSIS — R6520 Severe sepsis without septic shock: Secondary | ICD-10-CM | POA: Diagnosis present

## 2022-09-10 DIAGNOSIS — G9341 Metabolic encephalopathy: Secondary | ICD-10-CM | POA: Diagnosis present

## 2022-09-10 DIAGNOSIS — Z881 Allergy status to other antibiotic agents status: Secondary | ICD-10-CM

## 2022-09-10 DIAGNOSIS — R296 Repeated falls: Secondary | ICD-10-CM | POA: Diagnosis present

## 2022-09-10 DIAGNOSIS — Z823 Family history of stroke: Secondary | ICD-10-CM

## 2022-09-10 DIAGNOSIS — J449 Chronic obstructive pulmonary disease, unspecified: Secondary | ICD-10-CM | POA: Diagnosis present

## 2022-09-10 DIAGNOSIS — K7581 Nonalcoholic steatohepatitis (NASH): Secondary | ICD-10-CM | POA: Diagnosis present

## 2022-09-10 DIAGNOSIS — N136 Pyonephrosis: Secondary | ICD-10-CM | POA: Diagnosis not present

## 2022-09-10 DIAGNOSIS — Z880 Allergy status to penicillin: Secondary | ICD-10-CM

## 2022-09-10 DIAGNOSIS — Z6836 Body mass index (BMI) 36.0-36.9, adult: Secondary | ICD-10-CM

## 2022-09-10 DIAGNOSIS — Z79899 Other long term (current) drug therapy: Secondary | ICD-10-CM

## 2022-09-10 DIAGNOSIS — R7881 Bacteremia: Secondary | ICD-10-CM

## 2022-09-10 DIAGNOSIS — I2510 Atherosclerotic heart disease of native coronary artery without angina pectoris: Secondary | ICD-10-CM | POA: Diagnosis not present

## 2022-09-10 DIAGNOSIS — E669 Obesity, unspecified: Secondary | ICD-10-CM | POA: Diagnosis not present

## 2022-09-10 DIAGNOSIS — Z885 Allergy status to narcotic agent status: Secondary | ICD-10-CM

## 2022-09-10 DIAGNOSIS — Z818 Family history of other mental and behavioral disorders: Secondary | ICD-10-CM

## 2022-09-10 DIAGNOSIS — N179 Acute kidney failure, unspecified: Secondary | ICD-10-CM | POA: Diagnosis not present

## 2022-09-10 DIAGNOSIS — E1165 Type 2 diabetes mellitus with hyperglycemia: Secondary | ICD-10-CM | POA: Diagnosis present

## 2022-09-10 DIAGNOSIS — Z83438 Family history of other disorder of lipoprotein metabolism and other lipidemia: Secondary | ICD-10-CM

## 2022-09-10 DIAGNOSIS — N183 Chronic kidney disease, stage 3 unspecified: Secondary | ICD-10-CM | POA: Diagnosis not present

## 2022-09-10 DIAGNOSIS — Z90710 Acquired absence of both cervix and uterus: Secondary | ICD-10-CM

## 2022-09-10 DIAGNOSIS — Z8249 Family history of ischemic heart disease and other diseases of the circulatory system: Secondary | ICD-10-CM

## 2022-09-10 DIAGNOSIS — R4182 Altered mental status, unspecified: Secondary | ICD-10-CM | POA: Diagnosis not present

## 2022-09-10 DIAGNOSIS — J4489 Other specified chronic obstructive pulmonary disease: Secondary | ICD-10-CM | POA: Diagnosis present

## 2022-09-10 DIAGNOSIS — A419 Sepsis, unspecified organism: Secondary | ICD-10-CM | POA: Diagnosis not present

## 2022-09-10 DIAGNOSIS — R739 Hyperglycemia, unspecified: Secondary | ICD-10-CM | POA: Diagnosis not present

## 2022-09-10 DIAGNOSIS — Z79890 Hormone replacement therapy (postmenopausal): Secondary | ICD-10-CM

## 2022-09-10 DIAGNOSIS — N1330 Unspecified hydronephrosis: Secondary | ICD-10-CM | POA: Diagnosis not present

## 2022-09-10 DIAGNOSIS — R161 Splenomegaly, not elsewhere classified: Secondary | ICD-10-CM | POA: Diagnosis not present

## 2022-09-10 DIAGNOSIS — I25118 Atherosclerotic heart disease of native coronary artery with other forms of angina pectoris: Secondary | ICD-10-CM | POA: Diagnosis present

## 2022-09-10 DIAGNOSIS — R0689 Other abnormalities of breathing: Secondary | ICD-10-CM | POA: Diagnosis not present

## 2022-09-10 DIAGNOSIS — S199XXA Unspecified injury of neck, initial encounter: Secondary | ICD-10-CM | POA: Diagnosis not present

## 2022-09-10 DIAGNOSIS — E559 Vitamin D deficiency, unspecified: Secondary | ICD-10-CM | POA: Diagnosis present

## 2022-09-10 HISTORY — DX: Bacteremia: R78.81

## 2022-09-10 HISTORY — DX: Unspecified Escherichia coli (E. coli) as the cause of diseases classified elsewhere: B96.20

## 2022-09-10 LAB — COMPREHENSIVE METABOLIC PANEL
ALT: 30 U/L (ref 0–44)
AST: 41 U/L (ref 15–41)
Albumin: 2.9 g/dL — ABNORMAL LOW (ref 3.5–5.0)
Alkaline Phosphatase: 49 U/L (ref 38–126)
Anion gap: 15 (ref 5–15)
BUN: 22 mg/dL — ABNORMAL HIGH (ref 6–20)
CO2: 21 mmol/L — ABNORMAL LOW (ref 22–32)
Calcium: 9.1 mg/dL (ref 8.9–10.3)
Chloride: 101 mmol/L (ref 98–111)
Creatinine, Ser: 1.47 mg/dL — ABNORMAL HIGH (ref 0.44–1.00)
GFR, Estimated: 41 mL/min — ABNORMAL LOW (ref >60)
Glucose, Bld: 238 mg/dL — ABNORMAL HIGH (ref 70–99)
Potassium: 4.8 mmol/L (ref 3.5–5.1)
Sodium: 137 mmol/L (ref 135–145)
Total Bilirubin: 2 mg/dL — ABNORMAL HIGH (ref 0.3–1.2)
Total Protein: 6.4 g/dL — ABNORMAL LOW (ref 6.5–8.1)

## 2022-09-10 LAB — CBC WITH DIFFERENTIAL/PLATELET
Abs Immature Granulocytes: 0.14 10*3/uL — ABNORMAL HIGH (ref 0.00–0.07)
Basophils Absolute: 0 10*3/uL (ref 0.0–0.1)
Basophils Relative: 0 %
Eosinophils Absolute: 0 10*3/uL (ref 0.0–0.5)
Eosinophils Relative: 0 %
HCT: 32.1 % — ABNORMAL LOW (ref 36.0–46.0)
Hemoglobin: 10 g/dL — ABNORMAL LOW (ref 12.0–15.0)
Immature Granulocytes: 2 %
Lymphocytes Relative: 10 %
Lymphs Abs: 0.9 10*3/uL (ref 0.7–4.0)
MCH: 29.2 pg (ref 26.0–34.0)
MCHC: 31.2 g/dL (ref 30.0–36.0)
MCV: 93.6 fL (ref 80.0–100.0)
Monocytes Absolute: 0.5 10*3/uL (ref 0.1–1.0)
Monocytes Relative: 5 %
Neutro Abs: 7.9 10*3/uL — ABNORMAL HIGH (ref 1.7–7.7)
Neutrophils Relative %: 83 %
Platelets: 266 10*3/uL (ref 150–400)
RBC: 3.43 MIL/uL — ABNORMAL LOW (ref 3.87–5.11)
RDW: 14.1 % (ref 11.5–15.5)
WBC: 9.5 10*3/uL (ref 4.0–10.5)
nRBC: 0 % (ref 0.0–0.2)

## 2022-09-10 LAB — LACTIC ACID, PLASMA
Lactic Acid, Venous: 0.9 mmol/L (ref 0.5–1.9)
Lactic Acid, Venous: 1.1 mmol/L (ref 0.5–1.9)

## 2022-09-10 LAB — CBG MONITORING, ED: Glucose-Capillary: 224 mg/dL — ABNORMAL HIGH (ref 70–99)

## 2022-09-10 LAB — BLOOD GAS, VENOUS
Acid-Base Excess: 1 mmol/L (ref 0.0–2.0)
Bicarbonate: 26 mmol/L (ref 20.0–28.0)
O2 Saturation: 93.2 %
Patient temperature: 37
pCO2, Ven: 42 mmHg — ABNORMAL LOW (ref 44–60)
pH, Ven: 7.4 (ref 7.25–7.43)
pO2, Ven: 74 mmHg — ABNORMAL HIGH (ref 32–45)

## 2022-09-10 LAB — URINALYSIS, COMPLETE (UACMP) WITH MICROSCOPIC
Bilirubin Urine: NEGATIVE
Glucose, UA: >=500 mg/dL — AB
Ketones, ur: 5 mg/dL — AB
Nitrite: NEGATIVE
Protein, ur: 30 mg/dL — AB
Specific Gravity, Urine: 1.016 (ref 1.005–1.030)
WBC, UA: >50 WBC/hpf — ABNORMAL HIGH (ref 0–5)
pH: 6 (ref 5.0–8.0)

## 2022-09-10 LAB — RESP PANEL BY RT-PCR (RSV, FLU A&B, COVID)  RVPGX2
Influenza A by PCR: NEGATIVE
Influenza B by PCR: NEGATIVE
Resp Syncytial Virus by PCR: NEGATIVE
SARS Coronavirus 2 by RT PCR: NEGATIVE

## 2022-09-10 LAB — PROTIME-INR
INR: 1.2 (ref 0.8–1.2)
Prothrombin Time: 14.7 seconds (ref 11.4–15.2)

## 2022-09-10 LAB — BRAIN NATRIURETIC PEPTIDE: B Natriuretic Peptide: 46.6 pg/mL (ref 0.0–100.0)

## 2022-09-10 LAB — APTT: aPTT: 31 seconds (ref 24–36)

## 2022-09-10 MED ORDER — INSULIN ASPART 100 UNIT/ML IJ SOLN
0.0000 [IU] | Freq: Every day | INTRAMUSCULAR | Status: DC
  Administered 2022-09-10 – 2022-09-13 (×3): 2 [IU] via SUBCUTANEOUS
  Filled 2022-09-10 (×3): qty 1

## 2022-09-10 MED ORDER — SODIUM CHLORIDE 0.9 % IV SOLN: 2.0000 g | Freq: Once | INTRAVENOUS | Status: DC

## 2022-09-10 MED ORDER — LACTATED RINGERS IV SOLN: INTRAVENOUS | Status: DC

## 2022-09-10 MED ORDER — ACETAMINOPHEN 650 MG RE SUPP: 650.0000 mg | Freq: Four times a day (QID) | RECTAL | Status: DC | PRN

## 2022-09-10 MED ORDER — ONDANSETRON HCL 4 MG/2ML IJ SOLN
4.0000 mg | Freq: Four times a day (QID) | INTRAMUSCULAR | Status: DC | PRN
  Administered 2022-09-13: 4 mg via INTRAVENOUS
  Filled 2022-09-10: qty 2

## 2022-09-10 MED ORDER — ENOXAPARIN SODIUM 40 MG/0.4ML IJ SOSY
40.0000 mg | PREFILLED_SYRINGE | INTRAMUSCULAR | Status: DC
  Administered 2022-09-10 – 2022-09-13 (×4): 40 mg via SUBCUTANEOUS
  Filled 2022-09-10 (×4): qty 0.4

## 2022-09-10 MED ORDER — ONDANSETRON HCL 4 MG PO TABS: 4.0000 mg | ORAL_TABLET | Freq: Four times a day (QID) | ORAL | Status: DC | PRN

## 2022-09-10 MED ORDER — INSULIN ASPART 100 UNIT/ML IJ SOLN
0.0000 [IU] | Freq: Three times a day (TID) | INTRAMUSCULAR | Status: DC
  Administered 2022-09-11: 4 [IU] via SUBCUTANEOUS
  Administered 2022-09-11: 7 [IU] via SUBCUTANEOUS
  Administered 2022-09-11: 3 [IU] via SUBCUTANEOUS
  Administered 2022-09-12: 7 [IU] via SUBCUTANEOUS
  Administered 2022-09-12: 4 [IU] via SUBCUTANEOUS
  Administered 2022-09-12: 3 [IU] via SUBCUTANEOUS
  Administered 2022-09-13: 7 [IU] via SUBCUTANEOUS
  Administered 2022-09-13 (×2): 4 [IU] via SUBCUTANEOUS
  Administered 2022-09-14: 7 [IU] via SUBCUTANEOUS
  Administered 2022-09-14: 15 [IU] via SUBCUTANEOUS
  Filled 2022-09-10 (×11): qty 1

## 2022-09-10 MED ORDER — ACETAMINOPHEN 325 MG PO TABS
650.0000 mg | ORAL_TABLET | Freq: Once | ORAL | Status: AC
  Administered 2022-09-10: 650 mg via ORAL
  Filled 2022-09-10: qty 2

## 2022-09-10 MED ORDER — SODIUM CHLORIDE 0.9 % IV SOLN
2.0000 g | Freq: Two times a day (BID) | INTRAVENOUS | Status: AC
  Administered 2022-09-11 – 2022-09-12 (×4): 2 g via INTRAVENOUS
  Filled 2022-09-10 (×4): qty 12.5

## 2022-09-10 MED ORDER — SODIUM CHLORIDE 0.9 % IV SOLN
500.0000 mg | Freq: Once | INTRAVENOUS | Status: AC
  Administered 2022-09-10: 500 mg via INTRAVENOUS
  Filled 2022-09-10: qty 5

## 2022-09-10 MED ORDER — SODIUM CHLORIDE 0.9 % IV SOLN
2.0000 g | Freq: Once | INTRAVENOUS | Status: AC
  Administered 2022-09-10: 2 g via INTRAVENOUS
  Filled 2022-09-10: qty 12.5

## 2022-09-10 MED ORDER — ACETAMINOPHEN 325 MG PO TABS
650.0000 mg | ORAL_TABLET | Freq: Four times a day (QID) | ORAL | Status: DC | PRN
  Administered 2022-09-12 – 2022-09-14 (×2): 650 mg via ORAL
  Filled 2022-09-10 (×2): qty 2

## 2022-09-10 NOTE — ED Notes (Signed)
Pt is A&Ox4 after getting settled in ED.

## 2022-09-10 NOTE — Progress Notes (Signed)
[facility] following for sepsis protocol. 

## 2022-09-10 NOTE — Progress Notes (Signed)
CODE SEPSIS - PHARMACY COMMUNICATION  **Broad Spectrum Antibiotics should be administered within 1 hour of Sepsis diagnosis**  Time Code Sepsis Called/Page Received: 1350  Antibiotics Ordered: cefepime and azithromycin  Time of 1st antibiotic administration: 1418  Additional action taken by pharmacy: N/A   Lorin Picket ,PharmD Clinical Pharmacist  09/10/2022  2:09 PM

## 2022-09-10 NOTE — ED Provider Notes (Signed)
[facility] Provider Note    Event Date/Time   First MD Initiated Contact with Patient 09/10/22 1346     (approximate)   History   Altered Mental Status and Fall   HPI  [name] is a 57 y.o. female presents to the ER for evaluation of altered mental status fall fever.  Patient recently treated for influenza.  Does have a history of COPD wears home oxygen.  Patient very confused reports feeling weak.  Denies any abdominal pain at this time.     Physical Exam   Triage Vital Signs: ED Triage Vitals  Enc Vitals Group     BP --      Pulse Rate 09/10/22 1346 96     Resp 09/10/22 1346 (!) 24     Temp 09/10/22 1343 (!) 103 F (39.4 C)     Temp Source 09/10/22 1343 Axillary     SpO2 09/10/22 1346 96 %     Weight --      Height --      Head Circumference --      Peak Flow --      Pain Score --      Pain Loc --      Pain Edu? --      Excl. in [place]? --     Most recent vital signs: Vitals:   09/10/22 1346 09/10/22 1346  BP:  119/65  Pulse: 96   Resp: (!) 24   Temp:    SpO2: 96%      Constitutional: Alert protecting airway Eyes: Conjunctivae are normal.  Head: Atraumatic. Nose: No congestion/rhinnorhea. Mouth/Throat: Mucous membranes are moist.   Neck: Painless ROM.  Cardiovascular:   Good peripheral circulation. Respiratory: Normal respiratory effort.  No retractions.  Gastrointestinal: Soft and nontender.  Musculoskeletal:  no deformity Neurologic:  MAE spontaneously. No gross focal neurologic deficits are appreciated.  Skin:  Skin is warm, dry and intact. No rash noted. Psychiatric: Mood and affect are normal. Speech and behavior are normal.    ED Results / Procedures / Treatments   Labs (all labs ordered are listed, but only abnormal results are displayed) Labs Reviewed  CBC WITH DIFFERENTIAL/PLATELET - Abnormal; Notable for the following components:      Result Value   RBC 3.43 (*)    Hemoglobin 10.0 (*)    HCT 32.1 (*)     Neutro Abs 7.9 (*)    Abs Immature Granulocytes 0.14 (*)    All other components within normal limits  URINALYSIS, COMPLETE (UACMP) WITH MICROSCOPIC - Abnormal; Notable for the following components:   Color, Urine YELLOW (*)    APPearance HAZY (*)    Glucose, UA >=500 (*)    Hgb urine dipstick SMALL (*)    Ketones, ur 5 (*)    Protein, ur 30 (*)    Leukocytes,Ua MODERATE (*)    WBC, UA >50 (*)    Bacteria, UA RARE (*)    All other components within normal limits  BLOOD GAS, VENOUS - Abnormal; Notable for the following components:   pCO2, Ven 42 (*)    pO2, Ven 74 (*)    All other components within normal limits  RESP PANEL BY RT-PCR (RSV, FLU A&B, COVID)  RVPGX2  CULTURE, BLOOD (ROUTINE X 2)  CULTURE, BLOOD (ROUTINE X 2)  LACTIC ACID, PLASMA  PROTIME-INR  APTT  BRAIN NATRIURETIC PEPTIDE  LACTIC ACID, PLASMA  COMPREHENSIVE METABOLIC PANEL     EKG  ED  ECG REPORT I, Merlyn Lot, the attending physician, personally viewed and interpreted this ECG.   Date: 09/10/2022  EKG Time: 13:52  Rate: 95  Rhythm: somis  Axis: normal  Intervals: normal  ST&T Change: no stmei    RADIOLOGY Please see ED Course for my review and interpretation.  I personally reviewed all radiographic images ordered to evaluate for the above acute complaints and reviewed radiology reports and findings.  These findings were personally discussed with the patient.  Please see medical record for radiology report.    PROCEDURES:  Critical Care performed: Yes, see critical care procedure note(s)  .Critical Care  Performed by: Merlyn Lot, MD Authorized by: Merlyn Lot, MD   Critical care provider statement:    Critical care time (minutes):  35   Critical care was necessary to treat or prevent imminent or life-threatening deterioration of the following conditions:  Sepsis   Critical care was time spent personally by me on the following activities:  Development of treatment plan  with patient or surrogate, discussions with consultants, evaluation of patient's response to treatment, examination of patient, ordering and review of laboratory studies, ordering and review of radiographic studies, ordering and performing treatments and interventions, pulse oximetry, re-evaluation of patient's condition and review of old charts    [place] ED: Medications  ceFEPIme (MAXIPIME) 2 g in sodium chloride 0.9 % 100 mL IVPB (0 g Intravenous Stopped 09/10/22 1449)  azithromycin (ZITHROMAX) 500 mg in sodium chloride 0.9 % 250 mL IVPB (0 mg Intravenous Stopped 09/10/22 1536)     IMPRESSION / MDM / [place] / ED COURSE  I reviewed the triage vital signs and the nursing notes.                              Differential diagnosis includes, but is not limited to, Dehydration, sepsis, pna, uti, hypoglycemia, cva, drug effect, withdrawal, encephalitis  Patient presenting to the ER for evaluation of symptoms as described above.  Based on symptoms, risk factors and considered above differential, this presenting complaint could reflect a potentially life-threatening illness therefore the patient will be placed on continuous pulse oximetry and telemetry for monitoring.  Laboratory evaluation will be sent to evaluate for the above complaints.      Clinical Course as of 09/10/22 1554  Sun Sep 10, 2022  1420 Chest x-ray my review and interpretation without consolidation or effusion [PR]  1553 Viral panel negative.  UA consistent with pyuria.  Lactate normal BNP normal.  Awaiting CT imaging to rule out traumatic injury as cause of encephalopathy.  Patient be signed out to oncoming physician pending follow-up imaging anticipate will be appropriate for admission to this facility. [PR]    Clinical Course User Index [PR] Merlyn Lot, MD    FINAL CLINICAL IMPRESSION(S) / ED DIAGNOSES   Final diagnoses:  Sepsis, due to unspecified organism, unspecified whether acute  organ dysfunction present [facility])     Rx / DC Orders   ED Discharge Orders     None        Note:  This document was prepared using Dragon voice recognition software and may include unintentional dictation errors.    Merlyn Lot, MD 09/10/22 [phone]

## 2022-09-10 NOTE — H&P (Signed)
History and Physical    Patient: [name] [id number]:[id number] DOB: [birth year] DOA: 09/10/2022 DOS: the patient was seen and examined on 09/10/2022 PCP: Steele Sizer, MD  Patient coming from: Home  Chief Complaint:  Chief Complaint  Patient presents with   Altered Mental Status   Fall   HPI: [name] is a 57 y.o. female with medical history significant of bipolar disorder, systolic CHF, chronic migraines, chronic kidney disease COPD, coronary artery disease, diabetes with diabetic neuropathy, GERD, obstructive sleep apnea and multiple other medical problems who was brought in with altered mental status.  Patient husband called EMS after patient had multiple falls at home.  This started last night and then this morning she was completely altered.  She is typically on 2 to 3 L of oxygen at home.  She came in disoriented and obtunded.  Still unable to give me history.  Patient has had workup in the ER extensively.  Appears to have acute metabolic encephalopathy as a result of UTI.  Patient being admitted for further evaluation and treatment.  At this point cannot give further history.  Most of the history mainly obtained from the husband.  Review of Systems: As mentioned in the history of present illness. All other systems reviewed and are negative. Past Medical History:  Diagnosis Date   Anxiety    Arthritis    Asthma    Bipolar depression ([place])    CHF (congestive heart failure) (HCC)    Chronic headaches    migraines have lessened   Chronic kidney disease    ddstage 111   COPD (chronic obstructive pulmonary disease) (HCC)    Coronary artery disease    Depression    Diabetic neuropathy (HCC)    Dyspnea    uses inhalers for this   Dysrhythmia    rapid   Fatty liver    GERD (gastroesophageal reflux disease)    Headache    Heart attack ([place]) 2009   Hyperlipidemia    Hypertension    Hypomagnesemia    Hypothyroid    Irregular heart rhythm    Liver lesion    Favored to be  benign on CT; MRI of abd w/contract in Aug 2016. Liver Biopsy-Negative, March 2016   Morbid obesity ([place])    OSA (obstructive sleep apnea)    Peripheral vascular disease (HCC)    Pinched nerve    Back   Pneumonia    RLS (restless legs syndrome)    Seasonal allergies    Sleep apnea    uses oxygen at night 2L d/t cpap did not fit   Type 2 diabetes, uncontrolled, with neuropathy 10/15/2015   Overview:  Microalbumin 7.7 12/2010: Hgb A1c 10.2% 12/2010 and 9.7% 03/2011, eye exam 10/2009 [facility] DM clinic    Urinary incontinence    Vitamin D deficiency    Past Surgical History:  Procedure Laterality Date   ABDOMINAL HYSTERECTOMY     Partial   bladder stim  2007   BLADDER SURGERY     x 2. tacking of bladder and the other was the stimulator placement   CHOLECYSTECTOMY     COLONOSCOPY WITH PROPOFOL N/A 03/04/2019   Procedure: COLONOSCOPY WITH PROPOFOL;  Surgeon: [name], MD;  Location: [facility] ENDOSCOPY;  Service: Endoscopy;  Laterality: N/A;   ESOPHAGOGASTRODUODENOSCOPY (EGD) WITH PROPOFOL N/A 03/04/2019   Procedure: ESOPHAGOGASTRODUODENOSCOPY (EGD) WITH PROPOFOL;  Surgeon: [name], MD;  Location: [facility] ENDOSCOPY;  Service: Endoscopy;  Laterality: N/A;   INCISION AND DRAINAGE ABSCESS Right 04/16/2019  Procedure: INCISION AND DRAINAGE ABSCESS;  Surgeon: Benjamine Sprague, DO;  Location: [facility] ORS;  Service: General;  Laterality: Right;   INTERSTIM-GENERATOR CHANGE N/A 12/31/2017   Procedure: INTERSTIM-GENERATOR CHANGE;  Surgeon: Bjorn Loser, MD;  Location: [facility] ORS;  Service: Urology;  Laterality: N/A;   LIVER BIOPSY  March 2016   Negative   LUNG BIOPSY  2015   small spot on lung   TUBAL LIGATION     UPPER GI ENDOSCOPY     WRIST SURGERY Right 11/02/2019   Social History:  reports that she has never smoked. She has never used smokeless tobacco. She reports that she does not drink alcohol and does not use drugs.  Allergies  Allergen Reactions   Mirtazapine Anaphylaxis    REACTION: closes  throat   Morphine And Related     anaphylaxis   Sulfamethoxazole-Trimethoprim Rash    REACTION: Whelps all over   Amoxicillin Er Nausea And Vomiting    Pt doesn't remember problem with amoxicillin -  SE Has patient had a PCN reaction causing immediate rash, facial/tongue/throat swelling, SOB or lightheadedness with hypotension: No Has patient had a PCN reaction causing severe rash involving mucus membranes or skin necrosis: No Has patient had a PCN reaction that required hospitalization: No Has patient had a PCN reaction occurring within the last 10 years: Yes If all of the above answers are "NO", then may proceed with Cephalosporin use.   Codeine Itching    REACTION: itch, rash   Hydrocodone-Acetaminophen Rash    REACTION: rash   Keflex [Cephalexin] Rash    Pt does not remember having a reaction or rash, no airway involvement   Prasterone Nausea And Vomiting    Family History  Problem Relation Age of Onset   Diabetes Mother    Heart disease Mother    Hyperlipidemia Mother    Hypertension Mother    Kidney disease Mother    Mental illness Mother    Hypothyroidism Mother    Stroke Mother    Osteoporosis Mother    Glaucoma Mother    Congestive Heart Failure Mother    Hypertension Father    Kidney disease Brother    Intracerebral hemorrhage Brother    Breast cancer Maternal Grandmother 97   Heart disease Maternal Grandfather    Rheum arthritis Maternal Grandfather    Cancer Maternal Grandfather        liver   Kidney disease Maternal Grandfather    Cancer Paternal Grandmother        lung   Asthma Daughter    Cancer Daughter        throat   Bipolar disorder Daughter    Bipolar disorder Daughter    Hypertension Daughter    Restless legs syndrome Daughter     Prior to Admission medications   Medication Sig Start Date End Date Taking? Authorizing Provider  atorvastatin (LIPITOR) 80 MG tablet Take 80 mg by mouth at bedtime.     [provider]  baclofen  (LIORESAL) 10 MG tablet Take 1 tablet (10 mg total) by mouth 3 (three) times daily as needed for muscle spasms. 04/27/22   Steele Sizer, MD  busPIRone (BUSPAR) 30 MG tablet Take 30 mg by mouth 2 (two) times daily. 11/09/20   [provider]  cetirizine (ZYRTEC) 10 MG tablet Take 1 tablet (10 mg total) by mouth daily. 11/03/21   Bo Merino, FNP  citalopram (CELEXA) 20 MG tablet Take 20 mg by mouth daily. 01/16/20   Care, [place]  clopidogrel (PLAVIX) 75 MG tablet Take 1 tablet (75 mg total) by mouth daily. 10/31/21 10/31/22  Merlyn Lot, MD  esomeprazole (NEXIUM) 40 MG capsule TAKE 1 CAPSULE BY MOUTH IN THE MORNING 02/08/22   [name], MD  ezetimibe (ZETIA) 10 MG tablet Take 10 mg by mouth at bedtime. 01/05/17   [provider]  fenofibrate micronized (LOFIBRA) 134 MG capsule Take 134 mg by mouth at bedtime.    Morayati, Lourdes Sledge, MD  fluticasone (FLONASE) 50 MCG/ACT nasal spray PLACE 2 SPRAYS INTO BOTH NOSTRILS DAILY 06/09/22   Steele Sizer, MD  gabapentin (NEURONTIN) 300 MG capsule Take 300 mg by mouth 2 (two) times daily.    Morayati, Lourdes Sledge, MD  gabapentin (NEURONTIN) 600 MG tablet Take 600 mg by mouth at bedtime. 06/15/20   Morayati, Lourdes Sledge, MD  GLYXAMBI 25-5 MG TABS Take 1 tablet by mouth daily. 10/01/20   Morayati, Lourdes Sledge, MD  HUMULIN R U-500 KWIKPEN 500 UNIT/ML kwikpen Inject 70 Units into the skin 3 (three) times daily with meals. 01/10/21   [provider]  Insulin Aspart FlexPen (NOVOLOG) 100 UNIT/ML Inject into the skin. 12/05/21   [provider]  Insulin Pen Needle (PEN NEEDLES) 31G X 8 MM MISC  09/26/21   [provider]  ketoconazole (NIZORAL) 2 % cream Apply 1 application. topically daily. 12/28/21   Steele Sizer, MD  lamoTRIgine (LAMICTAL) 200 MG tablet Take 200 mg by mouth 2 (two) times daily.     [provider]  linaclotide Rolan Lipa) 145 MCG CAPS capsule TAKE 1 CAPSULE BY MOUTH DAILY BEFORE BREAKFAST  07/20/22   [name], MD  lisinopril (ZESTRIL) 2.5 MG tablet Take 1 tablet (2.5 mg total) by mouth daily. OFFICE VISIT NEEDED FOR ADDITIONAL REFILLS 07/03/22   Steele Sizer, MD  meclizine (ANTIVERT) 25 MG tablet TAKE 1 TABLET BY MOUTH 3 TIMES DAILY AS NEEDED FOR DIZZINESS OR NAUSEA 06/02/22   Ancil Boozer, Drue Stager, MD  nitroGLYCERIN (NITROSTAT) 0.4 MG SL tablet Place 1 tablet (0.4 mg total) under the tongue every 5 (five) minutes as needed for chest pain. 10/13/20   Minna Merritts, MD  ondansetron (ZOFRAN-ODT) 4 MG disintegrating tablet Take 1 tablet (4 mg total) by mouth every 8 (eight) hours as needed for nausea or vomiting. 08/29/22   Merlyn Lot, MD  OneTouch Delica Lancets 15V MISC by Does not apply route.    [provider]  promethazine (PHENERGAN) 12.5 MG tablet Take 1 tablet (12.5 mg total) by mouth every 6 (six) hours as needed. 01/18/22   [name], MD  promethazine (PHENERGAN) 25 MG suppository Place 1 suppository (25 mg total) rectally every 6 (six) hours as needed for nausea or vomiting. 02/05/22   Nance Pear, MD  QUEtiapine (SEROQUEL) 300 MG tablet Take 300 mg by mouth at bedtime. Along with '200mg'$  dose for a total of '500mg'$  11/23/20   Care, [facility]  SUMAtriptan (IMITREX) 100 MG tablet May repeat in 2 hours if headache persists or recurs. 02/21/22   Myles Gip, DO  SYNTHROID 100 MCG tablet Take 100 mcg by mouth daily. 10/01/20   Morayati, Lourdes Sledge, MD  TRULICITY 1.5 VO/1.6WV SOPN Inject 1.5 mg into the skin once a week. (Wednesdays) 10/01/20   Morayati, Lourdes Sledge, MD  Ubrogepant (UBRELVY) 100 MG TABS Take 1 tablet by mouth every other day. 12/28/21   Steele Sizer, MD    Physical Exam: Vitals:   09/10/22 1700 09/10/22 1800 09/10/22 1856 09/10/22 1900  BP: Marland Kitchen)  124/49   107/68  Pulse: (!) 103  99 (!) 101  Resp: (!) '21  20 17  '$ Temp:  99 F (37.2 C) 98.9 F (37.2 C)   TempSrc:  Oral Oral   SpO2: 93%  99% 91%   Constitutional: Obtunded, not  communicating NAD, calm, comfortable Eyes: PERRL, lids and conjunctivae normal ENMT: Mucous membranes are moist. Posterior pharynx clear of any exudate or lesions.Normal dentition.  Neck: normal, supple, no masses, no thyromegaly Respiratory: clear to auscultation bilaterally, no wheezing, no crackles. Normal respiratory effort. No accessory muscle use.  Cardiovascular: Sinus tachycardia, no murmurs / rubs / gallops. No extremity edema. 2+ pedal pulses. No carotid bruits.  Abdomen: no tenderness, no masses palpated. No hepatosplenomegaly. Bowel sounds positive.  Musculoskeletal: Good range of motion, no joint swelling or tenderness, Skin: no rashes, lesions, ulcers. No induration Neurologic: CN 2-12 grossly intact. Sensation intact, DTR normal. Strength 5/5 in all 4.  Psychiatric: Lethargic, slightly obtunded, disoriented  Data Reviewed:  Temperature 99, blood pressure 124/49, pulse 103, respiratory 21, oxygen sat 93% on room air.  Sodium is 137, glucose 238 and CO2 21, BUN 22 and creatinine 1.47.  Lactic acid 1.1.  Urinalysis showed moderate leukocytes.  WBC more than 50.  CT head and CT spine and renal stone showed right-sided hydronephrosis without evidence of stones possibly acute pyelonephritis.  White count is 9.5 with hemoglobin 10.0.  Assessment and Plan:  #1 acute metabolic encephalopathy: Secondary to acute pyelonephritis.  Patient will be admitted.  Initiate IV antibiotics for complex UTI.  Hydrate.  Monitor closely.  #2 acute pyelonephritis: Blood and urine cultures obtained.  Antibiotics initiated.  Continue to follow closely.  #3 diabetes: Sliding scale insulin  #4 chronic kidney disease stage III: Monitor renal function:  #5 moderate protein calorie malnutrition: Dietary counseling  #6 anemia of chronic disease: Monitor H&H.    Advance Care Planning:   Code Status: Full Code   Consults: None   Family Communication: No family at bedside  Severity of Illness: The  appropriate patient status for this patient is INPATIENT. Inpatient status is judged to be reasonable and necessary in order to provide the required intensity of service to ensure the patient's safety. [name] presenting symptoms, physical exam findings, and initial radiographic and laboratory data in the context of their chronic comorbidities is felt to place them at high risk for further clinical deterioration. Furthermore, it is not anticipated that the patient will be medically stable for discharge from the hospital within 2 midnights of admission.   * I certify that at the point of admission it is my clinical judgment that the patient will require inpatient hospital care spanning beyond 2 midnights from the point of admission due to high intensity of service, high risk for further deterioration and high frequency of surveillance required.*  AuthorBarbette Merino, MD 09/10/2022 7:28 PM  For on call review [email or web address].

## 2022-09-10 NOTE — ED Triage Notes (Addendum)
Pt brought in via ems from home. Pt husband told ems that pt had multiple falls last nighty and was altered this am. Pt wears baseline o2 (ems unaware of amount). Pt is disorientated x4 on arrival. Pt given '4mg'$  zofran IM PTA for dry heaving.

## 2022-09-10 NOTE — Consult Note (Signed)
PHARMACY -  BRIEF ANTIBIOTIC NOTE   Pharmacy has received consult(s) for cefepime dosing from an ED provider.  [name] profile has been reviewed for ht/wt/allergies/indication/available labs.    One time order(s) placed for cefepime 2 grams IV  Further antibiotics/pharmacy consults should be ordered by admitting physician if indicated.                       Thank you, Lorin Picket 09/10/2022  2:08 PM

## 2022-09-10 NOTE — Consult Note (Signed)
Pharmacy Antibiotic Note  [name] is a 57 y.o. female admitted on 09/10/2022 with sepsis.  Pharmacy has been consulted for cefepime dosing due to UTI.  Plan: Start Cefepime 2 grams IV every 12 hours.     Temp (24hrs), Avg:100.5 F (38.1 C), Min:98.9 F (37.2 C), Max:103 F (39.4 C)  Recent Labs  Lab 09/10/22 1350 09/10/22 1354 09/10/22 1631  WBC 9.5  --   --   CREATININE  --   --  1.47*  LATICACIDVEN  --  0.9 1.1    Estimated Creatinine Clearance: 38.5 mL/min (A) (by C-G formula based on SCr of 1.47 mg/dL (H)).    Allergies  Allergen Reactions   Mirtazapine Anaphylaxis    REACTION: closes throat   Morphine And Related     anaphylaxis   Sulfamethoxazole-Trimethoprim Rash    REACTION: Whelps all over   Amoxicillin Er Nausea And Vomiting    Pt doesn't remember problem with amoxicillin -  SE Has patient had a PCN reaction causing immediate rash, facial/tongue/throat swelling, SOB or lightheadedness with hypotension: No Has patient had a PCN reaction causing severe rash involving mucus membranes or skin necrosis: No Has patient had a PCN reaction that required hospitalization: No Has patient had a PCN reaction occurring within the last 10 years: Yes If all of the above answers are "NO", then may proceed with Cephalosporin use.   Codeine Itching    REACTION: itch, rash   Hydrocodone-Acetaminophen Rash    REACTION: rash   Keflex [Cephalexin] Rash    Pt does not remember having a reaction or rash, no airway involvement   Prasterone Nausea And Vomiting    Antimicrobials this admission: Azithromycin 1/7 Cefepime 1/7 >>   Dose adjustments this admission: N/A  Microbiology results: 1/7 BCx: pending   Thank you for allowing pharmacy to be a part of this patient's care.  Lorin Picket, PharmD 09/10/2022 7:35 PM

## 2022-09-11 ENCOUNTER — Other Ambulatory Visit: Payer: Self-pay

## 2022-09-11 DIAGNOSIS — N390 Urinary tract infection, site not specified: Secondary | ICD-10-CM

## 2022-09-11 DIAGNOSIS — R7881 Bacteremia: Secondary | ICD-10-CM

## 2022-09-11 DIAGNOSIS — G9340 Encephalopathy, unspecified: Secondary | ICD-10-CM | POA: Diagnosis not present

## 2022-09-11 DIAGNOSIS — F250 Schizoaffective disorder, bipolar type: Secondary | ICD-10-CM

## 2022-09-11 DIAGNOSIS — E039 Hypothyroidism, unspecified: Secondary | ICD-10-CM | POA: Diagnosis not present

## 2022-09-11 DIAGNOSIS — I5032 Chronic diastolic (congestive) heart failure: Secondary | ICD-10-CM

## 2022-09-11 DIAGNOSIS — E1122 Type 2 diabetes mellitus with diabetic chronic kidney disease: Secondary | ICD-10-CM | POA: Diagnosis not present

## 2022-09-11 DIAGNOSIS — I10 Essential (primary) hypertension: Secondary | ICD-10-CM | POA: Diagnosis not present

## 2022-09-11 DIAGNOSIS — E785 Hyperlipidemia, unspecified: Secondary | ICD-10-CM

## 2022-09-11 DIAGNOSIS — E669 Obesity, unspecified: Secondary | ICD-10-CM

## 2022-09-11 DIAGNOSIS — N183 Chronic kidney disease, stage 3 unspecified: Secondary | ICD-10-CM

## 2022-09-11 DIAGNOSIS — A419 Sepsis, unspecified organism: Secondary | ICD-10-CM | POA: Diagnosis not present

## 2022-09-11 LAB — BLOOD CULTURE ID PANEL (REFLEXED) - BCID2
A.calcoaceticus-baumannii: NOT DETECTED
Bacteroides fragilis: NOT DETECTED
CTX-M ESBL: NOT DETECTED
Candida albicans: NOT DETECTED
Candida auris: NOT DETECTED
Candida glabrata: NOT DETECTED
Candida krusei: NOT DETECTED
Candida parapsilosis: NOT DETECTED
Candida tropicalis: NOT DETECTED
Carbapenem resist OXA 48 LIKE: NOT DETECTED
Carbapenem resistance IMP: NOT DETECTED
Carbapenem resistance KPC: NOT DETECTED
Carbapenem resistance NDM: NOT DETECTED
Carbapenem resistance VIM: NOT DETECTED
Cryptococcus neoformans/gattii: NOT DETECTED
Enterobacter cloacae complex: NOT DETECTED
Enterobacterales: DETECTED — AB
Enterococcus Faecium: NOT DETECTED
Enterococcus faecalis: NOT DETECTED
Escherichia coli: NOT DETECTED
Haemophilus influenzae: NOT DETECTED
Klebsiella aerogenes: NOT DETECTED
Klebsiella oxytoca: NOT DETECTED
Klebsiella pneumoniae: NOT DETECTED
Listeria monocytogenes: NOT DETECTED
Neisseria meningitidis: NOT DETECTED
Proteus species: NOT DETECTED
Pseudomonas aeruginosa: NOT DETECTED
Salmonella species: NOT DETECTED
Serratia marcescens: NOT DETECTED
Staphylococcus epidermidis: NOT DETECTED
Staphylococcus lugdunensis: NOT DETECTED
Staphylococcus species: NOT DETECTED
Stenotrophomonas maltophilia: NOT DETECTED
Streptococcus agalactiae: NOT DETECTED
Streptococcus pneumoniae: NOT DETECTED
Streptococcus pyogenes: NOT DETECTED
Streptococcus species: NOT DETECTED

## 2022-09-11 LAB — COMPREHENSIVE METABOLIC PANEL
ALT: 32 U/L (ref 0–44)
AST: 43 U/L — ABNORMAL HIGH (ref 15–41)
Albumin: 2.8 g/dL — ABNORMAL LOW (ref 3.5–5.0)
Alkaline Phosphatase: 45 U/L (ref 38–126)
Anion gap: 15 (ref 5–15)
BUN: 26 mg/dL — ABNORMAL HIGH (ref 6–20)
CO2: 24 mmol/L (ref 22–32)
Calcium: 9.4 mg/dL (ref 8.9–10.3)
Chloride: 102 mmol/L (ref 98–111)
Creatinine, Ser: 1.25 mg/dL — ABNORMAL HIGH (ref 0.44–1.00)
GFR, Estimated: 50 mL/min — ABNORMAL LOW (ref >60)
Glucose, Bld: 205 mg/dL — ABNORMAL HIGH (ref 70–99)
Potassium: 4.2 mmol/L (ref 3.5–5.1)
Sodium: 141 mmol/L (ref 135–145)
Total Bilirubin: 1.3 mg/dL — ABNORMAL HIGH (ref 0.3–1.2)
Total Protein: 6.6 g/dL (ref 6.5–8.1)

## 2022-09-11 LAB — CORTISOL-AM, BLOOD: Cortisol - AM: 23.9 ug/dL — ABNORMAL HIGH (ref 6.7–22.6)

## 2022-09-11 LAB — PROTIME-INR
INR: 1.2 (ref 0.8–1.2)
Prothrombin Time: 14.7 seconds (ref 11.4–15.2)

## 2022-09-11 LAB — CBC
HCT: 32 % — ABNORMAL LOW (ref 36.0–46.0)
Hemoglobin: 10 g/dL — ABNORMAL LOW (ref 12.0–15.0)
MCH: 28.7 pg (ref 26.0–34.0)
MCHC: 31.3 g/dL (ref 30.0–36.0)
MCV: 92 fL (ref 80.0–100.0)
Platelets: 284 10*3/uL (ref 150–400)
RBC: 3.48 MIL/uL — ABNORMAL LOW (ref 3.87–5.11)
RDW: 14.3 % (ref 11.5–15.5)
WBC: 7.2 10*3/uL (ref 4.0–10.5)
nRBC: 0 % (ref 0.0–0.2)

## 2022-09-11 LAB — GLUCOSE, CAPILLARY: Glucose-Capillary: 197 mg/dL — ABNORMAL HIGH (ref 70–99)

## 2022-09-11 LAB — HEMOGLOBIN A1C
Hgb A1c MFr Bld: 8 % — ABNORMAL HIGH (ref 4.8–5.6)
Mean Plasma Glucose: 183 mg/dL

## 2022-09-11 LAB — CBG MONITORING, ED
Glucose-Capillary: 207 mg/dL — ABNORMAL HIGH (ref 70–99)
Glucose-Capillary: 146 mg/dL — ABNORMAL HIGH (ref 70–99)
Glucose-Capillary: 159 mg/dL — ABNORMAL HIGH (ref 70–99)

## 2022-09-11 LAB — PROCALCITONIN: Procalcitonin: 16.27 ng/mL

## 2022-09-11 MED ORDER — FENOFIBRATE 54 MG PO TABS
54.0000 mg | ORAL_TABLET | Freq: Every day | ORAL | Status: DC
  Administered 2022-09-11 – 2022-09-14 (×4): 54 mg via ORAL
  Filled 2022-09-11 (×4): qty 1

## 2022-09-11 MED ORDER — LACTATED RINGERS IV SOLN: INTRAVENOUS | Status: AC

## 2022-09-11 MED ORDER — GABAPENTIN 600 MG PO TABS
600.0000 mg | ORAL_TABLET | Freq: Every day | ORAL | Status: DC
  Administered 2022-09-11 – 2022-09-13 (×3): 600 mg via ORAL
  Filled 2022-09-11 (×3): qty 1

## 2022-09-11 MED ORDER — LAMOTRIGINE 100 MG PO TABS
200.0000 mg | ORAL_TABLET | Freq: Two times a day (BID) | ORAL | Status: DC
  Administered 2022-09-11 – 2022-09-14 (×7): 200 mg via ORAL
  Filled 2022-09-11 (×7): qty 2

## 2022-09-11 MED ORDER — QUETIAPINE FUMARATE 300 MG PO TABS
300.0000 mg | ORAL_TABLET | Freq: Two times a day (BID) | ORAL | Status: DC
  Administered 2022-09-11 – 2022-09-14 (×7): 300 mg via ORAL
  Filled 2022-09-11 (×7): qty 1

## 2022-09-11 MED ORDER — LEVOTHYROXINE SODIUM 100 MCG PO TABS
100.0000 ug | ORAL_TABLET | Freq: Every day | ORAL | Status: DC
  Administered 2022-09-11 – 2022-09-14 (×4): 100 ug via ORAL
  Filled 2022-09-11 (×4): qty 1

## 2022-09-11 MED ORDER — BUSPIRONE HCL 10 MG PO TABS
30.0000 mg | ORAL_TABLET | Freq: Two times a day (BID) | ORAL | Status: DC
  Administered 2022-09-11 – 2022-09-14 (×7): 30 mg via ORAL
  Filled 2022-09-11 (×6): qty 3
  Filled 2022-09-11: qty 6

## 2022-09-11 MED ORDER — ATORVASTATIN CALCIUM 20 MG PO TABS
80.0000 mg | ORAL_TABLET | Freq: Every day | ORAL | Status: DC
  Administered 2022-09-11 – 2022-09-13 (×3): 80 mg via ORAL
  Filled 2022-09-11 (×3): qty 4

## 2022-09-11 MED ORDER — METOPROLOL TARTRATE 25 MG PO TABS
25.0000 mg | ORAL_TABLET | Freq: Two times a day (BID) | ORAL | Status: DC
  Administered 2022-09-11 – 2022-09-14 (×7): 25 mg via ORAL
  Filled 2022-09-11 (×7): qty 1

## 2022-09-11 MED ORDER — CLOPIDOGREL BISULFATE 75 MG PO TABS
75.0000 mg | ORAL_TABLET | Freq: Every day | ORAL | Status: DC
  Administered 2022-09-11 – 2022-09-14 (×4): 75 mg via ORAL
  Filled 2022-09-11 (×4): qty 1

## 2022-09-11 MED ORDER — INSULIN DETEMIR 100 UNIT/ML ~~LOC~~ SOLN
5.0000 [IU] | Freq: Every day | SUBCUTANEOUS | Status: DC
  Administered 2022-09-11 – 2022-09-12 (×2): 5 [IU] via SUBCUTANEOUS
  Filled 2022-09-11 (×3): qty 0.05

## 2022-09-11 MED ORDER — GABAPENTIN 300 MG PO CAPS
300.0000 mg | ORAL_CAPSULE | Freq: Two times a day (BID) | ORAL | Status: DC
  Administered 2022-09-11 – 2022-09-14 (×7): 300 mg via ORAL
  Filled 2022-09-11 (×7): qty 1

## 2022-09-11 MED ORDER — PANTOPRAZOLE SODIUM 20 MG PO TBEC
20.0000 mg | DELAYED_RELEASE_TABLET | Freq: Every day | ORAL | Status: DC
  Administered 2022-09-11 – 2022-09-14 (×4): 20 mg via ORAL
  Filled 2022-09-11 (×4): qty 1

## 2022-09-11 MED ORDER — FLUTICASONE PROPIONATE 50 MCG/ACT NA SUSP: 2.0000 | Freq: Every day | NASAL | Status: DC | PRN

## 2022-09-11 MED ORDER — LINACLOTIDE 145 MCG PO CAPS
145.0000 ug | ORAL_CAPSULE | Freq: Every day | ORAL | Status: DC
  Administered 2022-09-12 – 2022-09-14 (×3): 145 ug via ORAL
  Filled 2022-09-11 (×3): qty 1

## 2022-09-11 MED ORDER — CITALOPRAM HYDROBROMIDE 20 MG PO TABS
20.0000 mg | ORAL_TABLET | Freq: Every day | ORAL | Status: DC
  Administered 2022-09-11 – 2022-09-14 (×4): 20 mg via ORAL
  Filled 2022-09-11 (×4): qty 1

## 2022-09-11 MED ORDER — EZETIMIBE 10 MG PO TABS
10.0000 mg | ORAL_TABLET | Freq: Every day | ORAL | Status: DC
  Administered 2022-09-11 – 2022-09-13 (×3): 10 mg via ORAL
  Filled 2022-09-11 (×3): qty 1

## 2022-09-11 MED ORDER — HYDRALAZINE HCL 25 MG PO TABS: 25.0000 mg | ORAL_TABLET | Freq: Three times a day (TID) | ORAL | Status: DC | PRN

## 2022-09-11 NOTE — Assessment & Plan Note (Signed)
Continue PPI ?

## 2022-09-11 NOTE — Assessment & Plan Note (Signed)
Patient was on Trulicity and Humulin at home. -A1c pending -Continue with SSI -Added Levemir 5 units at bedtime

## 2022-09-11 NOTE — Assessment & Plan Note (Signed)
UTI and acute pyelonephritis. Acute metabolic encephalopathy. Patient met severe sepsis criteria with fever, tachycardia, tachypnea and end organ damage manifested as AKI and altered mental status.  Preliminary 1/4 blood cultures with Enterobacter.  Urine cultures pending.  Elevated procalcitonin at 16.27. AKI resolved and mental status improving. ID was consulted -Continue with cefepime for more now -Follow-up final culture results

## 2022-09-11 NOTE — Assessment & Plan Note (Signed)
-  Continue statin, Zetia and fenofibrate

## 2022-09-11 NOTE — Assessment & Plan Note (Signed)
Appears to have CKD stage III A, had AKI on presentation, creatinine improving. -Monitor renal function -Avoid nephrotoxins

## 2022-09-11 NOTE — Progress Notes (Signed)
Lab called and requested phlebotomist draw for CPOD.

## 2022-09-11 NOTE — Progress Notes (Signed)
Progress Note   Patient: [name] [id number]:[id number] DOB: Dec 20, 1965 DOA: 09/10/2022     1 DOS: the patient was seen and examined on 09/11/2022   Brief hospital course: Taken from H&P.  [name] is a 57 y.o. female with medical history significant of bipolar disorder, systolic CHF, chronic migraines, chronic kidney disease COPD (2 to 3 L of oxygen at home), coronary artery disease, diabetes with diabetic neuropathy, GERD, obstructive sleep apnea and multiple other medical problems who was brought in with altered mental status.  Patient husband called EMS after patient had multiple falls at home.  This started last night and then this morning she was completely altered.   ED course and data reviewed.  Febrile with a recorded temperature of 103 otherwise hemodynamically stable.  Labs pertinent for BUN of 22 and creatinine of 1.47.  Lactic acid 1.1.  UA with moderate leukocytosis.  CT head and cervical spine was negative for any acute abnormality.  Chest x-ray with no active disease.  CT renal stone study with right-sided hydronephrosis without evidence of stones which could indicate recent passage of a stone versus pyelonephritis.  Mild splenomegaly.  Cultures were drawn and she was started on cefepime.  1/8: Vitals normal.  1 out of 4 blood culture bottles with Enterobacter -no definitive organism identified.  ID was consulted.  Urine cultures were added as add-on.  Procalcitonin elevated at 16.27.  CBC stable. Current respiratory viral panel negative, recent history of influenza B positive on 08/31/2022. PT ordered for history of falls   Assessment and Plan: * Severe sepsis ([place]) UTI and acute pyelonephritis. Acute metabolic encephalopathy. Patient met severe sepsis criteria with fever, tachycardia, tachypnea and end organ damage manifested as AKI and altered mental status.  Preliminary 1/4 blood cultures with Enterobacter.  Urine cultures pending.  Elevated procalcitonin at 16.27. AKI  resolved and mental status improving. ID was consulted -Continue with cefepime for more now -Follow-up final culture results  CKD stage 3 due to type 2 diabetes mellitus ([place]) Appears to have CKD stage III A, had AKI on presentation, creatinine improving. -Monitor renal function -Avoid nephrotoxins  HTN (hypertension)   Blood pressure mildly elevated. -Continue with metoprolol -As needed hydralazine  Type II diabetes mellitus with renal manifestations ([place]) Patient was on Trulicity and Humulin at home. -A1c pending -Continue with SSI -Added Levemir 5 units at bedtime  Hypothyroid -Continue with Synthroid  Obesity (BMI 35.0-39.9 without comorbidity) Estimated body mass index is 36.15 kg/m as calculated from the following:   Height as of 08/31/22: '4\' 11"'$  (1.499 m).   Weight as of 08/31/22: 81.2 kg.   -Patient need counseling for weight loss  Schizoaffective disorder, bipolar type ([place]) On multiple psych meds at home which were resumed  GERD (gastroesophageal reflux disease) -Continue PPI  HLD (hyperlipidemia) -Continue statin, Zetia and fenofibrate  COPD (chronic obstructive pulmonary disease) ([place]) No current wheezing.  Patient mostly uses 2 to 3 L of oxygen while sleeping. Recent influenza B infection.  Chest x-ray normal -As needed bronchodilator -Continue nighttime O2  Chronic diastolic CHF (congestive heart failure) (HCC) Recent echocardiogram done in June 2023 with normal EF and grade 1 diastolic dysfunction.  Clinically euvolemic and normal BNP. Not on any diuretic. -Continue to monitor as patient is getting some IV fluid   Subjective: Patient was seen and examined today.  Continued to have some upper respiratory symptoms with nasal congestion and cough which is being going on since her recent upper respiratory infection with influenza  B.  She was also continued to have burning micturition.  Physical Exam: Vitals:   09/11/22 0400 09/11/22 0800 09/11/22  0959 09/11/22 1000  BP: 124/62 (!) 133/57  (!) 151/90  Pulse: 95 95  95  Resp: '16 16  16  '$ Temp: 97.8 F (36.6 C)  98.5 F (36.9 C)   TempSrc: Oral  Oral   SpO2: 93% 93%  93%   General.  Obese lady, in no acute distress. Pulmonary.  Lungs clear bilaterally, normal respiratory effort. CV.  Regular rate and rhythm, no JVD, rub or murmur. Abdomen.  Soft, nontender, nondistended, BS positive. CNS.  Alert and oriented .  No focal neurologic deficit. Extremities.  No edema, no cyanosis, pulses intact and symmetrical. Psychiatry.  Judgment and insight appears normal.   Data Reviewed: Prior data reviewed  Family Communication: Discussed with husband on phone.  Disposition: Status is: Inpatient Remains inpatient appropriate because: Severity of illness  Planned Discharge Destination: Home  DVT prophylaxis.  Lovenox Time spent: 50 minutes  This record has been created using Systems analyst. Errors have been sought and corrected,but may not always be located. Such creation errors do not reflect on the standard of care.   Author: Lorella Nimrod, MD 09/11/2022 2:36 PM  For on call review [email or web address].

## 2022-09-11 NOTE — Inpatient Diabetes Management (Addendum)
Inpatient Diabetes Program Recommendations  AACE/ADA: New Consensus Statement on Inpatient Glycemic Control   Target Ranges:  Prepandial:   less than 140 mg/dL      Peak postprandial:   less than 180 mg/dL (1-2 hours)      Critically ill patients:  140 - 180 mg/dL    Latest Reference Range & Units 09/10/22 22:02 09/11/22 08:02  Glucose-Capillary 70 - 99 mg/dL 224 (H) 207 (H)    Latest Reference Range & Units 10/05/21 06:41  Hemoglobin A1C 4.8 - 5.6 % 8.3 (H)   Review of Glycemic Control  Diabetes history: DM2 Outpatient Diabetes medications: Humulin R U500 70 units TID, Trulicity 1.5 mg Qweek, Humalog per correction scale Current orders for Inpatient glycemic control: Novolog 0-20 units TID with meals, Novolog 0-5 units QHS  Inpatient Diabetes Program Recommendations:    Insulin: Please consider ordering Semglee 5 units Q24H.  HbgA1C: A1C in process.  Addendum 09/11/22'@12'$ :05-Spoke with patient over the phone regarding DM medications and DM control. Patient states that she sees Arboriculturist for DM control and she reports she takes Humulin R U500 70 units TID, Trulicity 1.5 mg Qweek, and Humalog per correction scale when needed. Patient states that she sometimes skips the U500 insulin dose and she reports that she has been told not take the U500 when she is sick and not eating. Patient states she has not taken Humulin R U500 insulin in 4 days prior to arrival at hospital. Patient reports that she did not eat breakfast today but she is going to try to eat lunch. Discussed current insulin orders and recommendations to start low dose basal insulin. Patient verbalized understanding of information and has no questions at this time.  Thanks, Barnie Alderman, RN, MSN, [place] Diabetes Coordinator Inpatient Diabetes Program [phone] (Team Pager from 8am to [place])

## 2022-09-11 NOTE — Progress Notes (Signed)
Patient ambulated to restroom with steady gait.

## 2022-09-11 NOTE — Assessment & Plan Note (Signed)
Blood pressure mildly elevated. -Continue with metoprolol -As needed hydralazine

## 2022-09-11 NOTE — Assessment & Plan Note (Signed)
On multiple psych meds at home which were resumed

## 2022-09-11 NOTE — Assessment & Plan Note (Signed)
Continue with Synthroid 

## 2022-09-11 NOTE — Assessment & Plan Note (Signed)
No current wheezing.  Patient mostly uses 2 to 3 L of oxygen while sleeping. Recent influenza B infection.  Chest x-ray normal -As needed bronchodilator -Continue nighttime O2

## 2022-09-11 NOTE — Hospital Course (Addendum)
Taken from H&P.  [name] is a 57 y.o. female with medical history significant of bipolar disorder, systolic CHF, chronic migraines, chronic kidney disease COPD (2 to 3 L of oxygen at home), coronary artery disease, diabetes with diabetic neuropathy, GERD, obstructive sleep apnea and multiple other medical problems who was brought in with altered mental status.  Patient husband called EMS after patient had multiple falls at home.  This started last night and then this morning she was completely altered.   ED course and data reviewed.  Febrile with a recorded temperature of 103 otherwise hemodynamically stable.  Labs pertinent for BUN of 22 and creatinine of 1.47.  Lactic acid 1.1.  UA with moderate leukocytosis.  CT head and cervical spine was negative for any acute abnormality.  Chest x-ray with no active disease.  CT renal stone study with right-sided hydronephrosis without evidence of stones which could indicate recent passage of a stone versus pyelonephritis.  Mild splenomegaly.  Cultures were drawn and she was started on cefepime.  1/8: Vitals normal.  1 out of 4 blood culture bottles with Enterobacter -no definitive organism identified.  ID was consulted.  Urine cultures were added as add-on.  Procalcitonin elevated at 16.27.  CBC stable. Current respiratory viral panel negative, recent history of influenza B positive on 08/31/2022. PT ordered for history of falls.  1/9: Hemodynamically stable.  Wants to go home.  Blood cultures growing E. coli-pending susceptibility.  1/10: Remained afebrile with stable vitals.  Procalcitonin improving appropriately, currently at 6.93 today, peaked at 16.27.Susceptibilities still pending.  Antibiotics switched with ceftriaxone. Developed some nausea and decreased appetite today. PT is recommending home health which was ordered.  1/11: Vitals stable..  CBG elevated at 239-increasing the dose of Levemir to 8 units twice daily. Repeat urine cultures which  were done after couple of days of antibiotics with 3000 colonies of pansensitive E. coli.  Blood cultures with pansensitive E. coli too.  Patient received cefepime followed by ceftriaxone while in the hospital and discharged on 4 more days of Levaquin as advised by infectious disease.  She will continue with rest of her home medications and need to have a close follow-up with her providers for further recommendations.

## 2022-09-11 NOTE — Assessment & Plan Note (Signed)
Estimated body mass index is 36.15 kg/m as calculated from the following:   Height as of 08/31/22: '4\' 11"'$  (1.499 m).   Weight as of 08/31/22: 81.2 kg.   -Patient need counseling for weight loss

## 2022-09-11 NOTE — ED Notes (Signed)
Informed RN bed assigned 

## 2022-09-11 NOTE — Consult Note (Signed)
NAME: [name]  DOB: November 10, 1965  MRN: [id number]  Date/Time: 09/11/2022 12:58 PM  REQUESTING PROVIDER: Dr.Amin Subjective:  REASON FOR CONSULT: Enterobacteriaceae bacteremia ? [name] is a 57 y.o. female with a history ofCOPD, CAD, DM with neuropathy, OSA , bipolar disorder has bene to the ED three times in the past 2 weeks. She first came on 08/29/22 with falls , generalized malaise and weakness and was diagnosed with influenza B. Back to the ED on 12/28 with fever and dizziness and temp was 100.2. was discharged from ED- came back to ED on 09/10/22 with multiple falls and disorientation. Vitals in the ED 119/65, Temp 103, RR 24 and pulse 96 Labs revealed  Latest Reference Range & Units 09/10/22 13:50  WBC 4.0 - 10.5 K/uL 9.5  Hemoglobin 12.0 - 15.0 g/dL 10.0 (L)  HCT 36.0 - 46.0 % 32.1 (L)  Platelets 150 - 400 K/uL 266  Cr was 1.47 CT abdomen showed mild hydronephrosis with no stone and possible pyelonephritis Blood culture sent- started on cefepime/azithromycin UA > 50 wbc I am asked to see her for enterobacteriaceae bacteremia. Pt  has a bladder  stimulator for mixed urinary incontinence and followed by urology, last seen in Jan 2023   Past Medical History:  Diagnosis Date   Anxiety    Arthritis    Asthma    Bipolar depression ([place])    CHF (congestive heart failure) (HCC)    Chronic headaches    migraines have lessened   Chronic kidney disease    ddstage 111   COPD (chronic obstructive pulmonary disease) ([place])    Coronary artery disease    Depression    Diabetic neuropathy ([place])    Dyspnea    uses inhalers for this   Dysrhythmia    rapid   Fatty liver    GERD (gastroesophageal reflux disease)    Headache    Heart attack ([place]) 2009   Hyperlipidemia    Hypertension    Hypomagnesemia    Hypothyroid    Irregular heart rhythm    Liver lesion    Favored to be benign on CT; MRI of abd w/contract in Aug 2016. Liver Biopsy-Negative, March 2016   Morbid obesity  ([place])    OSA (obstructive sleep apnea)    Peripheral vascular disease (HCC)    Pinched nerve    Back   Pneumonia    RLS (restless legs syndrome)    Seasonal allergies    Sleep apnea    uses oxygen at night 2L d/t cpap did not fit   Type 2 diabetes, uncontrolled, with neuropathy 10/15/2015   Overview:  Microalbumin 7.7 12/2010: Hgb A1c 10.2% 12/2010 and 9.7% 03/2011, eye exam 10/2009 [facility]    Urinary incontinence    Vitamin D deficiency     Past Surgical History:  Procedure Laterality Date   ABDOMINAL HYSTERECTOMY     Partial   bladder stim  2007   BLADDER SURGERY     x 2. tacking of bladder and the other was the stimulator placement   CHOLECYSTECTOMY     COLONOSCOPY WITH PROPOFOL N/A 03/04/2019   Procedure: COLONOSCOPY WITH PROPOFOL;  Surgeon: [name], MD;  Location[facility] ENDOSCOPY;  Service: Endoscopy;  Laterality: N/A;   ESOPHAGOGASTRODUODENOSCOPY (EGD) WITH PROPOFOL N/A 03/04/2019   Procedure: ESOPHAGOGASTRODUODENOSCOPY (EGD) WITH PROPOFOL;  Surgeon: [name], MD;  Location: [facility] ENDOSCOPY;  Service: Endoscopy;  Laterality: N/A;   INCISION AND DRAINAGE ABSCESS Right 04/16/2019   Procedure: INCISION AND  DRAINAGE ABSCESS;  Surgeon: Benjamine Sprague, DO;  Location: [facility] ORS;  Service: General;  Laterality: Right;   INTERSTIM-GENERATOR CHANGE N/A 12/31/2017   Procedure: INTERSTIM-GENERATOR CHANGE;  Surgeon: Bjorn Loser, MD;  Location: [facility] ORS;  Service: Urology;  Laterality: N/A;   LIVER BIOPSY  March 2016   Negative   LUNG BIOPSY  2015   small spot on lung   TUBAL LIGATION     UPPER GI ENDOSCOPY     WRIST SURGERY Right 11/02/2019    Social History   Socioeconomic History   Marital status: Married    Spouse name: Not on file   Number of children: 2   Years of education: 12   Highest education level: Not on file  Occupational History   Occupation: disabled  Tobacco Use   Smoking status: Never   Smokeless tobacco: Never  Vaping Use   Vaping Use: Never used   Substance and Sexual Activity   Alcohol use: No   Drug use: No   Sexual activity: Yes    Partners: Male    Birth control/protection: Surgical  Other Topics Concern   Not on file  Social History Narrative   Pt lives with husband and grandchildren   Social Determinants of Health   Financial Resource Strain: Low Risk  (02/02/2022)   Overall Financial Resource Strain (CARDIA)    Difficulty of Paying Living Expenses: Not very hard  Food Insecurity: Food Insecurity Present (02/15/2022)   Hunger Vital Sign    Worried About Running Out of Food in the Last Year: Often true    Ran Out of Food in the Last Year: Often true  Transportation Needs: No Transportation Needs (02/02/2022)   PRAPARE - Hydrologist (Medical): No    Lack of Transportation (Non-Medical): No  Physical Activity: Inactive (02/02/2022)   Exercise Vital Sign    Days of Exercise per Week: 0 days    Minutes of Exercise per Session: 0 min  Stress: No Stress Concern Present (02/02/2022)   [place]    Feeling of Stress : Only a little  Social Connections: Moderately Isolated (02/02/2022)   Social Connection and Isolation Panel [NHANES]    Frequency of Communication with Friends and Family: More than three times a week    Frequency of Social Gatherings with Friends and Family: More than three times a week    Attends Religious Services: Never    Marine scientist or Organizations: No    Attends Archivist Meetings: Never    Marital Status: Married  Human resources officer Violence: Not At Risk (02/02/2022)   Humiliation, Afraid, Rape, and Kick questionnaire    Fear of Current or Ex-Partner: No    Emotionally Abused: No    Physically Abused: No    Sexually Abused: No    Family History  Problem Relation Age of Onset   Diabetes Mother    Heart disease Mother    Hyperlipidemia Mother    Hypertension Mother    Kidney disease  Mother    Mental illness Mother    Hypothyroidism Mother    Stroke Mother    Osteoporosis Mother    Glaucoma Mother    Congestive Heart Failure Mother    Hypertension Father    Kidney disease Brother    Intracerebral hemorrhage Brother    Breast cancer Maternal Grandmother 42   Heart disease Maternal Grandfather    Rheum arthritis Maternal Grandfather  Cancer Maternal Grandfather        liver   Kidney disease Maternal Grandfather    Cancer Paternal Grandmother        lung   Asthma Daughter    Cancer Daughter        throat   Bipolar disorder Daughter    Bipolar disorder Daughter    Hypertension Daughter    Restless legs syndrome Daughter    Allergies  Allergen Reactions   Mirtazapine Anaphylaxis    REACTION: closes throat   Morphine And Related     anaphylaxis   Sulfamethoxazole-Trimethoprim Rash    REACTION: Whelps all over   Amoxicillin Er Nausea And Vomiting    Pt doesn't remember problem with amoxicillin -  SE Has patient had a PCN reaction causing immediate rash, facial/tongue/throat swelling, SOB or lightheadedness with hypotension: No Has patient had a PCN reaction causing severe rash involving mucus membranes or skin necrosis: No Has patient had a PCN reaction that required hospitalization: No Has patient had a PCN reaction occurring within the last 10 years: Yes If all of the above answers are "NO", then may proceed with Cephalosporin use.   Codeine Itching    REACTION: itch, rash   Hydrocodone-Acetaminophen Rash    REACTION: rash   Keflex [Cephalexin] Rash    Pt does not remember having a reaction or rash, no airway involvement   Prasterone Nausea And Vomiting   I? Current Facility-Administered Medications  Medication Dose Route Frequency Provider Last Rate Last Admin   acetaminophen (TYLENOL) tablet 650 mg  650 mg Oral Q6H PRN Elwyn Reach, MD       Or   acetaminophen (TYLENOL) suppository 650 mg  650 mg Rectal Q6H PRN Elwyn Reach, MD        atorvastatin (LIPITOR) tablet 80 mg  80 mg Oral QHS Amin, Soundra Pilon, MD       busPIRone (BUSPAR) tablet 30 mg  30 mg Oral BID Lorella Nimrod, MD   30 mg at 09/11/22 0950   ceFEPIme (MAXIPIME) 2 g in sodium chloride 0.9 % 100 mL IVPB  2 g Intravenous Q12H Lorin Picket, Selz at 09/11/22 0249   citalopram (CELEXA) tablet 20 mg  20 mg Oral Daily Lorella Nimrod, MD   20 mg at 09/11/22 0950   clopidogrel (PLAVIX) tablet 75 mg  75 mg Oral Daily Lorella Nimrod, MD   75 mg at 09/11/22 0951   enoxaparin (LOVENOX) injection 40 mg  40 mg Subcutaneous Q24H Gala Romney L, MD   40 mg at 09/10/22 2208   ezetimibe (ZETIA) tablet 10 mg  10 mg Oral QHS Lorella Nimrod, MD       fenofibrate tablet 54 mg  54 mg Oral Daily Lorella Nimrod, MD   54 mg at 09/11/22 0951   fluticasone (FLONASE) 50 MCG/ACT nasal spray 2 spray  2 spray Each Nare Daily PRN Lorella Nimrod, MD       gabapentin (NEURONTIN) capsule 300 mg  300 mg Oral BID Lorella Nimrod, MD   300 mg at 09/11/22 0950   gabapentin (NEURONTIN) tablet 600 mg  600 mg Oral QHS Amin, Soundra Pilon, MD       insulin aspart (novoLOG) injection 0-20 Units  0-20 Units Subcutaneous TID WC Elwyn Reach, MD   3 Units at 09/11/22 1207   insulin aspart (novoLOG) injection 0-5 Units  0-5 Units Subcutaneous QHS Elwyn Reach, MD   2 Units at 09/10/22 2208   lactated ringers  infusion   Intravenous Continuous Elwyn Reach, MD 125 mL/hr at 09/11/22 7482 Infusion Verify at 09/11/22 7078   lamoTRIgine (LAMICTAL) tablet 200 mg  200 mg Oral BID Lorella Nimrod, MD   200 mg at 09/11/22 0950   levothyroxine (SYNTHROID) tablet 100 mcg  100 mcg Oral Q0600 Lorella Nimrod, MD   100 mcg at 09/11/22 0950   metoprolol tartrate (LOPRESSOR) tablet 25 mg  25 mg Oral BID Lorella Nimrod, MD   25 mg at 09/11/22 0951   ondansetron (ZOFRAN) tablet 4 mg  4 mg Oral Q6H PRN Elwyn Reach, MD       Or   ondansetron (ZOFRAN) injection 4 mg  4 mg Intravenous Q6H PRN Elwyn Reach, MD        QUEtiapine (SEROQUEL) tablet 300 mg  300 mg Oral BID Lorella Nimrod, MD   300 mg at 09/11/22 0950   Current Outpatient Medications  Medication Sig Dispense Refill   atorvastatin (LIPITOR) 80 MG tablet Take 80 mg by mouth at bedtime.      busPIRone (BUSPAR) 30 MG tablet Take 30 mg by mouth 2 (two) times daily.     citalopram (CELEXA) 20 MG tablet Take 20 mg by mouth daily.     clopidogrel (PLAVIX) 75 MG tablet Take 1 tablet (75 mg total) by mouth daily. 30 tablet 11   esomeprazole (NEXIUM) 40 MG capsule TAKE 1 CAPSULE BY MOUTH IN THE MORNING (Patient taking differently: Take 40 mg by mouth every morning.) 30 capsule 9   ezetimibe (ZETIA) 10 MG tablet Take 10 mg by mouth at bedtime.     fenofibrate micronized (LOFIBRA) 134 MG capsule Take 134 mg by mouth at bedtime.     fluticasone (FLONASE) 50 MCG/ACT nasal spray PLACE 2 SPRAYS INTO BOTH NOSTRILS DAILY 16 g 2   gabapentin (NEURONTIN) 300 MG capsule Take 300 mg by mouth 2 (two) times daily.     gabapentin (NEURONTIN) 600 MG tablet Take 600 mg by mouth at bedtime.     GLYXAMBI 25-5 MG TABS Take 1 tablet by mouth daily.     HUMULIN R U-500 KWIKPEN 500 UNIT/ML kwikpen Inject 70 Units into the skin 3 (three) times daily with meals.     lamoTRIgine (LAMICTAL) 200 MG tablet Take 200 mg by mouth 2 (two) times daily.      linaclotide (LINZESS) 145 MCG CAPS capsule TAKE 1 CAPSULE BY MOUTH DAILY BEFORE BREAKFAST (Patient taking differently: Take 145 mcg by mouth daily before breakfast.) 30 capsule 3   meclizine (ANTIVERT) 25 MG tablet TAKE 1 TABLET BY MOUTH 3 TIMES DAILY AS NEEDED FOR DIZZINESS OR NAUSEA 10 tablet 0   metoprolol tartrate (LOPRESSOR) 25 MG tablet Take 25 mg by mouth 2 (two) times daily.     nitroGLYCERIN (NITROSTAT) 0.4 MG SL tablet Place 1 tablet (0.4 mg total) under the tongue every 5 (five) minutes as needed for chest pain. 25 tablet 0   ondansetron (ZOFRAN-ODT) 4 MG disintegrating tablet Take 1 tablet (4 mg total) by mouth every 8  (eight) hours as needed for nausea or vomiting. 8 tablet 0   QUEtiapine (SEROQUEL) 300 MG tablet Take 300 mg by mouth at bedtime. Along with '200mg'$  dose for a total of '500mg'$      SUMAtriptan (IMITREX) 100 MG tablet May repeat in 2 hours if headache persists or recurs. 10 tablet 0   SYNTHROID 100 MCG tablet Take 100 mcg by mouth daily.     Ubrogepant (UBRELVY) 100 MG TABS  Take 1 tablet by mouth every other day. 16 tablet 5   baclofen (LIORESAL) 10 MG tablet Take 1 tablet (10 mg total) by mouth 3 (three) times daily as needed for muscle spasms. (Patient not taking: Reported on 09/10/2022) 30 each 0   cetirizine (ZYRTEC) 10 MG tablet Take 1 tablet (10 mg total) by mouth daily. (Patient not taking: Reported on 09/10/2022) 30 tablet 0   Insulin Aspart FlexPen (NOVOLOG) 100 UNIT/ML Inject into the skin.     Insulin Pen Needle (PEN NEEDLES) 31G X 8 MM MISC      ketoconazole (NIZORAL) 2 % cream Apply 1 application. topically daily. (Patient not taking: Reported on 09/10/2022) 60 g 1   lisinopril (ZESTRIL) 2.5 MG tablet Take 1 tablet (2.5 mg total) by mouth daily. OFFICE VISIT NEEDED FOR ADDITIONAL REFILLS (Patient not taking: Reported on 09/10/2022) 10 tablet 0   OneTouch Delica Lancets 27O MISC by Does not apply route.     promethazine (PHENERGAN) 12.5 MG tablet Take 1 tablet (12.5 mg total) by mouth every 6 (six) hours as needed. (Patient not taking: Reported on 09/10/2022) 30 tablet 1   promethazine (PHENERGAN) 25 MG suppository Place 1 suppository (25 mg total) rectally every 6 (six) hours as needed for nausea or vomiting. 12 each 0   TRULICITY 1.5 JJ/0.0XF SOPN Inject 1.5 mg into the skin once a week. (Wednesdays)       Abtx:  Anti-infectives (From admission, onward)    Start     Dose/Rate Route Frequency Ordered Stop   09/11/22 0200  ceFEPIme (MAXIPIME) 2 g in sodium chloride 0.9 % 100 mL IVPB        2 g 200 mL/hr over 30 Minutes Intravenous Every 12 hours 09/10/22 1935 09/17/22 1359   09/10/22 2030   ceFEPIme (MAXIPIME) 2 g in sodium chloride 0.9 % 100 mL IVPB  Status:  Discontinued        2 g 200 mL/hr over 30 Minutes Intravenous  Once 09/10/22 2025 09/10/22 2028   09/10/22 1400  ceFEPIme (MAXIPIME) 2 g in sodium chloride 0.9 % 100 mL IVPB        2 g 200 mL/hr over 30 Minutes Intravenous  Once 09/10/22 1350 09/10/22 1449   09/10/22 1400  azithromycin (ZITHROMAX) 500 mg in sodium chloride 0.9 % 250 mL IVPB        500 mg 250 mL/hr over 60 Minutes Intravenous  Once 09/10/22 1350 09/10/22 1536      Pt was in deep sleep and when I woke her up it took some time to wake up and answer questions- She said she took serquil this morning and hence was sleeping REVIEW OF SYSTEMS:  Const:  fever, negative chills, negative weight loss Eyes: negative diplopia or visual changes, negative eye pain ENT: negative coryza, negative sore throat Resp: ++ cough, , dyspnea Cards:  chest pain due to coughing no, palpitations, lower extremity edema GU: has dysuria, incontinence GI: abdominal pain due to coughing, no diarrhea, bleeding, constipation Skin: negative for rash and pruritus Heme: negative for easy bruising and gum/nose bleeding MS: weakness Neurolo:falls,confusion  Psych:  anxiety, depression  Endocrine: diabetes Allergy/Immunology- as above Objective:  VITALS:  BP (!) 151/90 (BP Location: Right Arm)   Pulse 95   Temp 98.5 F (36.9 C) (Oral)   Resp 16   SpO2 93%   PHYSICAL EXAM:  General: was sleeping deeply and when I woke her up she was alert and oriented in place and person.  Head:  Normocephalic, without obvious abnormality, atraumatic. Eyes: Conjunctivae clear, anicteric sclerae. Pupils are equal ENT Nares normal. No drainage or sinus tenderness. Lips, mucosa, and tongue normal. No Thrush Neck: Supple, symmetrical, no adenopathy, thyroid: non tender no carotid bruit and no JVD. Back: No CVA tenderness. Lungs: Clear to auscultation bilaterally. No Wheezing or Rhonchi. No  rales. Heart: Regular rate and rhythm, no murmur, rub or gallop. Abdomen: Soft, non-tender,not distended. Bowel sounds normal. No masses Extremities: atraumatic, no cyanosis. No edema. No clubbing Skin: No rashes or lesions. Or bruising Lymph: Cervical, supraclavicular normal. Neurologic: Grossly non-focal Pertinent Labs Lab Results CBC    Component Value Date/Time   WBC 7.2 09/11/2022 0805   RBC 3.48 (L) 09/11/2022 0805   HGB 10.0 (L) 09/11/2022 0805   HGB 11.2 09/22/2015 1339   HCT 32.0 (L) 09/11/2022 0805   HCT 34.8 09/22/2015 1339   PLT 284 09/11/2022 0805   PLT 337 09/22/2015 1339   MCV 92.0 09/11/2022 0805   MCV 90 09/22/2015 1339   MCV 87 12/21/2014 1234   MCH 28.7 09/11/2022 0805   MCHC 31.3 09/11/2022 0805   RDW 14.3 09/11/2022 0805   RDW 14.7 09/22/2015 1339   RDW 14.3 12/21/2014 1234   LYMPHSABS 0.9 09/10/2022 1350   LYMPHSABS 3.1 09/22/2015 1339   LYMPHSABS 2.6 12/21/2014 1234   MONOABS 0.5 09/10/2022 1350   MONOABS 0.4 12/21/2014 1234   EOSABS 0.0 09/10/2022 1350   EOSABS 0.2 09/22/2015 1339   EOSABS 0.2 12/21/2014 1234   BASOSABS 0.0 09/10/2022 1350   BASOSABS 0.1 09/22/2015 1339   BASOSABS 0.0 12/21/2014 1234       Latest Ref Rng & Units 09/11/2022    8:05 AM 09/10/2022    4:31 PM 08/31/2022    7:12 AM  CMP  Glucose 70 - 99 mg/dL 205  238  148   BUN 6 - 20 mg/dL 26  22  41   Creatinine 0.44 - 1.00 mg/dL 1.25  1.47  1.54   Sodium 135 - 145 mmol/L 141  137  134   Potassium 3.5 - 5.1 mmol/L 4.2  4.8  4.2   Chloride 98 - 111 mmol/L 102  101  99   CO2 22 - 32 mmol/L '24  21  22   '$ Calcium 8.9 - 10.3 mg/dL 9.4  9.1  8.5   Total Protein 6.5 - 8.1 g/dL 6.6  6.4    Total Bilirubin 0.3 - 1.2 mg/dL 1.3  2.0    Alkaline Phos 38 - 126 U/L 45  49    AST 15 - 41 U/L 43  41    ALT 0 - 44 U/L 32  30        Microbiology: Recent Results (from the past 240 hour(s))  Resp panel by RT-PCR (RSV, Flu A&B, Covid) Anterior Nasal Swab     Status: None   Collection  Time: 09/10/22  1:50 PM   Specimen: Anterior Nasal Swab  Result Value Ref Range Status   SARS Coronavirus 2 by RT PCR NEGATIVE NEGATIVE Final    Comment: (NOTE) SARS-CoV-2 target nucleic acids are NOT DETECTED.  The SARS-CoV-2 RNA is generally detectable in upper respiratory specimens during the acute phase of infection. The lowest concentration of SARS-CoV-2 viral copies this assay can detect is 138 copies/mL. A negative result does not preclude SARS-Cov-2 infection and should not be used as the sole basis for treatment or other patient management decisions. A negative result may occur with  improper specimen collection/handling,  submission of specimen other than nasopharyngeal swab, presence of viral mutation(s) within the areas targeted by this assay, and inadequate number of viral copies(<138 copies/mL). A negative result must be combined with clinical observations, patient history, and epidemiological information. The expected result is Negative.  Fact Sheet for Patients:  EntrepreneurPulse.com.au  Fact Sheet for Healthcare Providers:  IncredibleEmployment.be  This test is no t yet approved or cleared by the [place] FDA and  has been authorized for detection and/or diagnosis of SARS-CoV-2 by FDA under an Emergency Use Authorization (EUA). This EUA will remain  in effect (meaning this test can be used) for the duration of the COVID-19 declaration under Section 564(b)(1) of the Act, 21 U.S.C.section 360bbb-3(b)(1), unless the authorization is terminated  or revoked sooner.       Influenza A by PCR NEGATIVE NEGATIVE Final   Influenza B by PCR NEGATIVE NEGATIVE Final    Comment: (NOTE) The Xpert Xpress SARS-CoV-2/FLU/RSV plus assay is intended as an aid in the diagnosis of influenza from Nasopharyngeal swab specimens and should not be used as a sole basis for treatment. Nasal washings and aspirates are unacceptable for Xpert Xpress  SARS-CoV-2/FLU/RSV testing.  Fact Sheet for Patients: EntrepreneurPulse.com.au  Fact Sheet for Healthcare Providers: IncredibleEmployment.be  This test is not yet approved or cleared by the [place] FDA and has been authorized for detection and/or diagnosis of SARS-CoV-2 by FDA under an Emergency Use Authorization (EUA). This EUA will remain in effect (meaning this test can be used) for the duration of the COVID-19 declaration under Section 564(b)(1) of the Act, 21 U.S.C. section 360bbb-3(b)(1), unless the authorization is terminated or revoked.     Resp Syncytial Virus by PCR NEGATIVE NEGATIVE Final    Comment: (NOTE) Fact Sheet for Patients: EntrepreneurPulse.com.au  Fact Sheet for Healthcare Providers: IncredibleEmployment.be  This test is not yet approved or cleared by the [place] FDA and has been authorized for detection and/or diagnosis of SARS-CoV-2 by FDA under an Emergency Use Authorization (EUA). This EUA will remain in effect (meaning this test can be used) for the duration of the COVID-19 declaration under Section 564(b)(1) of the Act, 21 U.S.C. section 360bbb-3(b)(1), unless the authorization is terminated or revoked.  Performed at [facility], [address] [place], [place] [address]   Blood Culture (routine x 2)     Status: None (Preliminary result)   Collection Time: 09/10/22  1:54 PM   Specimen: BLOOD RIGHT FOREARM  Result Value Ref Range Status   Specimen Description BLOOD RIGHT FOREARM  Final   Special Requests   Final    BOTTLES DRAWN AEROBIC AND ANAEROBIC Blood Culture adequate volume   Culture   Final    NO GROWTH < 24 HOURS Performed at [facility], [address] [place], [place] [address]    Report Status PENDING  Incomplete  Blood Culture (routine x 2)     Status: None (Preliminary result)   Collection Time: 09/10/22  1:54 PM    Specimen: BLOOD LEFT HAND  Result Value Ref Range Status   Specimen Description   Final    BLOOD LEFT HAND Performed at [facility], [address], [place], [place] [address]    Special Requests   Final    BOTTLES DRAWN AEROBIC AND ANAEROBIC Blood Culture adequate volume Performed at [facility], [address], [place],  [address]    Culture  Setup Time   Final    GRAM NEGATIVE RODS ANAEROBIC BOTTLE ONLY  CRITICAL RESULT CALLED TO, READ BACK BY AND VERIFIED WITH: JASON ROBBINS @ 0303 09/11/22 LFD Performed at [facility], [place] [address], [place], [place] [address]    Culture GRAM NEGATIVE RODS  Final   Report Status PENDING  Incomplete  Blood Culture ID Panel (Reflexed)     Status: Abnormal (Preliminary result)   Collection Time: 09/10/22  1:54 PM  Result Value Ref Range Status   Enterococcus faecalis NOT DETECTED NOT DETECTED Final   Enterococcus Faecium NOT DETECTED NOT DETECTED Final   Listeria monocytogenes NOT DETECTED NOT DETECTED Final   Staphylococcus species NOT DETECTED NOT DETECTED Final   Staphylococcus aureus (BCID) PENDING NOT DETECTED Incomplete   Staphylococcus epidermidis NOT DETECTED NOT DETECTED Final   Staphylococcus lugdunensis NOT DETECTED NOT DETECTED Final   Streptococcus species NOT DETECTED NOT DETECTED Final   Streptococcus agalactiae NOT DETECTED NOT DETECTED Final   Streptococcus pneumoniae NOT DETECTED NOT DETECTED Final   Streptococcus pyogenes NOT DETECTED NOT DETECTED Final   A.calcoaceticus-baumannii NOT DETECTED NOT DETECTED Final   Bacteroides fragilis NOT DETECTED NOT DETECTED Final   Enterobacterales DETECTED (A) NOT DETECTED Final    Comment: Enterobacterales represent a large order of gram negative bacteria, not a single organism. Refer to culture for further identification. CRITICAL RESULT CALLED TO, READ BACK BY AND VERIFIED WITH: JASON ROBBINS '@0303'$  09/11/22 LFD    Enterobacter cloacae complex NOT  DETECTED NOT DETECTED Final   Escherichia coli NOT DETECTED NOT DETECTED Final   Klebsiella aerogenes NOT DETECTED NOT DETECTED Final   Klebsiella oxytoca NOT DETECTED NOT DETECTED Final   Klebsiella pneumoniae NOT DETECTED NOT DETECTED Final   Proteus species NOT DETECTED NOT DETECTED Final   Salmonella species NOT DETECTED NOT DETECTED Final   Serratia marcescens NOT DETECTED NOT DETECTED Final   Haemophilus influenzae NOT DETECTED NOT DETECTED Final   Neisseria meningitidis NOT DETECTED NOT DETECTED Final   Pseudomonas aeruginosa NOT DETECTED NOT DETECTED Final   Stenotrophomonas maltophilia NOT DETECTED NOT DETECTED Final   Candida albicans NOT DETECTED NOT DETECTED Final   Candida auris NOT DETECTED NOT DETECTED Final   Candida glabrata NOT DETECTED NOT DETECTED Final   Candida krusei NOT DETECTED NOT DETECTED Final   Candida parapsilosis NOT DETECTED NOT DETECTED Final   Candida tropicalis NOT DETECTED NOT DETECTED Final   Cryptococcus neoformans/gattii NOT DETECTED NOT DETECTED Final   CTX-M ESBL NOT DETECTED NOT DETECTED Final   Carbapenem resistance IMP NOT DETECTED NOT DETECTED Final   Carbapenem resistance KPC NOT DETECTED NOT DETECTED Final   Carbapenem resistance NDM NOT DETECTED NOT DETECTED Final   Carbapenem resist OXA 48 LIKE NOT DETECTED NOT DETECTED Final   Carbapenem resistance VIM NOT DETECTED NOT DETECTED Final    Comment: Performed at [facility], [address] [place], [place] [address]    IMAGING RESULTS: CT renal reviewed Mild rt hydronephrosis I have personally reviewed the films ? Impression/Recommendation Gram neg bacteremia- very likely due to UTI, rt pyelonephritis/mild hydronephrosis- no renal stone She has a bladder stimulator Will get post void bladder scan to look for residue Continue cefepime- watch closely for any AMS    Acute metabolic encephalopathy due to infection- Falls- could be from infection but need to examine her  neurological system carefully and also look into medication side effects  Recent Influenza B  Diabetes mellitus on insulin  Hypothyroidism on synhroid  COPD  OSA - watch closely for co2 retention   ? ? ___________________________________________________ Discussed  with patient, and requesting provider Note:  This document was prepared using Dragon voice recognition software and may include unintentional dictation errors.

## 2022-09-11 NOTE — Progress Notes (Signed)
PHARMACY - PHYSICIAN COMMUNICATION CRITICAL VALUE ALERT - BLOOD CULTURE IDENTIFICATION (BCID)  [name] is an 57 y.o. female who presented to [facility] on 09/10/2022 with a chief complaint of sepsis  Assessment:  Enterobacterales in 1 of 4 bottles, anaerobic; exact organism not ID'Yang  (include suspected source if known)  Name of physician (or Provider) Contacted: Neomia Glass, NP   Current antibiotics: Cefepime 2 gm Q12H   Changes to prescribed antibiotics recommended:  Patient is on recommended antibiotics - No changes needed  Results for orders placed or performed during the hospital encounter of 09/10/22  Blood Culture ID Panel (Reflexed) (Collected: 09/10/2022  1:54 PM)  Result Value Ref Range   Enterococcus faecalis NOT DETECTED NOT DETECTED   Enterococcus Faecium NOT DETECTED NOT DETECTED   Listeria monocytogenes NOT DETECTED NOT DETECTED   Staphylococcus species NOT DETECTED NOT DETECTED   Staphylococcus aureus (BCID) PENDING NOT DETECTED   Staphylococcus epidermidis NOT DETECTED NOT DETECTED   Staphylococcus lugdunensis NOT DETECTED NOT DETECTED   Streptococcus species NOT DETECTED NOT DETECTED   Streptococcus agalactiae NOT DETECTED NOT DETECTED   Streptococcus pneumoniae NOT DETECTED NOT DETECTED   Streptococcus pyogenes NOT DETECTED NOT DETECTED   A.calcoaceticus-baumannii NOT DETECTED NOT DETECTED   Bacteroides fragilis NOT DETECTED NOT DETECTED   Enterobacterales DETECTED (A) NOT DETECTED   Enterobacter cloacae complex NOT DETECTED NOT DETECTED   Escherichia coli NOT DETECTED NOT DETECTED   Klebsiella aerogenes NOT DETECTED NOT DETECTED   Klebsiella oxytoca NOT DETECTED NOT DETECTED   Klebsiella pneumoniae NOT DETECTED NOT DETECTED   Proteus species NOT DETECTED NOT DETECTED   Salmonella species NOT DETECTED NOT DETECTED   Serratia marcescens NOT DETECTED NOT DETECTED   Haemophilus influenzae NOT DETECTED NOT DETECTED   Neisseria meningitidis NOT DETECTED NOT  DETECTED   Pseudomonas aeruginosa NOT DETECTED NOT DETECTED   Stenotrophomonas maltophilia NOT DETECTED NOT DETECTED   Candida albicans NOT DETECTED NOT DETECTED   Candida auris NOT DETECTED NOT DETECTED   Candida glabrata NOT DETECTED NOT DETECTED   Candida krusei NOT DETECTED NOT DETECTED   Candida parapsilosis NOT DETECTED NOT DETECTED   Candida tropicalis NOT DETECTED NOT DETECTED   Cryptococcus neoformans/gattii NOT DETECTED NOT DETECTED   CTX-M ESBL NOT DETECTED NOT DETECTED   Carbapenem resistance IMP NOT DETECTED NOT DETECTED   Carbapenem resistance KPC NOT DETECTED NOT DETECTED   Carbapenem resistance NDM NOT DETECTED NOT DETECTED   Carbapenem resist OXA 48 LIKE NOT DETECTED NOT DETECTED   Carbapenem resistance VIM NOT DETECTED NOT DETECTED    [name] 09/11/2022  3:50 AM

## 2022-09-11 NOTE — Assessment & Plan Note (Signed)
Recent echocardiogram done in June 2023 with normal EF and grade 1 diastolic dysfunction.  Clinically euvolemic and normal BNP. Not on any diuretic. -Continue to monitor as patient is getting some IV fluid

## 2022-09-12 DIAGNOSIS — A419 Sepsis, unspecified organism: Secondary | ICD-10-CM

## 2022-09-12 DIAGNOSIS — N390 Urinary tract infection, site not specified: Secondary | ICD-10-CM | POA: Diagnosis not present

## 2022-09-12 DIAGNOSIS — R7881 Bacteremia: Secondary | ICD-10-CM | POA: Diagnosis not present

## 2022-09-12 DIAGNOSIS — B9620 Unspecified Escherichia coli [E. coli] as the cause of diseases classified elsewhere: Secondary | ICD-10-CM | POA: Diagnosis not present

## 2022-09-12 DIAGNOSIS — R6520 Severe sepsis without septic shock: Secondary | ICD-10-CM | POA: Diagnosis not present

## 2022-09-12 DIAGNOSIS — G9340 Encephalopathy, unspecified: Secondary | ICD-10-CM | POA: Diagnosis not present

## 2022-09-12 LAB — GLUCOSE, CAPILLARY
Glucose-Capillary: 146 mg/dL — ABNORMAL HIGH (ref 70–99)
Glucose-Capillary: 232 mg/dL — ABNORMAL HIGH (ref 70–99)
Glucose-Capillary: 178 mg/dL — ABNORMAL HIGH (ref 70–99)
Glucose-Capillary: 234 mg/dL — ABNORMAL HIGH (ref 70–99)

## 2022-09-12 LAB — BASIC METABOLIC PANEL
Anion gap: 10 (ref 5–15)
BUN: 22 mg/dL — ABNORMAL HIGH (ref 6–20)
CO2: 29 mmol/L (ref 22–32)
Calcium: 9.4 mg/dL (ref 8.9–10.3)
Chloride: 100 mmol/L (ref 98–111)
Creatinine, Ser: 1.01 mg/dL — ABNORMAL HIGH (ref 0.44–1.00)
GFR, Estimated: >60 mL/min (ref >60)
Glucose, Bld: 173 mg/dL — ABNORMAL HIGH (ref 70–99)
Potassium: 4.1 mmol/L (ref 3.5–5.1)
Sodium: 139 mmol/L (ref 135–145)

## 2022-09-12 LAB — PROCALCITONIN: Procalcitonin: 11.11 ng/mL

## 2022-09-12 MED ORDER — SODIUM CHLORIDE 0.9 % IV SOLN
2.0000 g | INTRAVENOUS | Status: DC
  Administered 2022-09-13 – 2022-09-14 (×2): 2 g via INTRAVENOUS
  Filled 2022-09-12 (×2): qty 2

## 2022-09-12 NOTE — Assessment & Plan Note (Signed)
Blood pressure within goal. -Continue with metoprolol -As needed hydralazine

## 2022-09-12 NOTE — Consult Note (Signed)
Pharmacy Antibiotic Note  [name] is a 57 y.o. female admitted on 09/10/2022 with sepsis.  Pharmacy has been consulted for cefepime dosing due to UTI.  Patient now has 1/4 blood cultures growing GNR.  ID is following.  Plan: Continue Cefepime 2 grams IV every 12 hours. Follow renal function for adjustments  Height: '4\' 11"'$  (149.9 cm) Weight: 83.3 kg (183 lb 9.6 oz) IBW/kg (Calculated) : 43.2  Temp (24hrs), Avg:98.2 F (36.8 C), Min:98 F (36.7 C), Max:98.5 F (36.9 C)  Recent Labs  Lab 09/10/22 1350 09/10/22 1354 09/10/22 1631 09/11/22 0805 09/12/22 0320  WBC 9.5  --   --  7.2  --   CREATININE  --   --  1.47* 1.25* 1.01*  LATICACIDVEN  --  0.9 1.1  --   --      Estimated Creatinine Clearance: 56.7 mL/min (A) (by C-G formula based on SCr of 1.01 mg/dL (H)).    Allergies  Allergen Reactions   Mirtazapine Anaphylaxis    REACTION: closes throat   Morphine And Related     anaphylaxis   Sulfamethoxazole-Trimethoprim Rash    REACTION: Whelps all over   Amoxicillin Er Nausea And Vomiting    Pt doesn't remember problem with amoxicillin -  SE Has patient had a PCN reaction causing immediate rash, facial/tongue/throat swelling, SOB or lightheadedness with hypotension: No Has patient had a PCN reaction causing severe rash involving mucus membranes or skin necrosis: No Has patient had a PCN reaction that required hospitalization: No Has patient had a PCN reaction occurring within the last 10 years: Yes If all of the above answers are "NO", then may proceed with Cephalosporin use.   Codeine Itching    REACTION: itch, rash   Hydrocodone-Acetaminophen Rash    REACTION: rash   Keflex [Cephalexin] Rash    Pt does not remember having a reaction or rash, no airway involvement   Prasterone Nausea And Vomiting    Antimicrobials this admission: Azithromycin 1/7 Cefepime 1/7 >>   Dose adjustments this admission: N/A  Microbiology results: 1/7 BCx: 1/4 GNR (BCID:  Enterobacterales) 1/7 Ucx: pending (ordered after patient received Abx)   Thank you for allowing pharmacy to be a part of this patient's care.  Lorin Picket, PharmD 09/12/2022 7:32 AM

## 2022-09-12 NOTE — Assessment & Plan Note (Signed)
UTI and acute pyelonephritis. Acute metabolic encephalopathy. Patient met severe sepsis criteria with fever, tachycardia, tachypnea and end organ damage manifested as AKI and altered mental status.  Blood cultures with E. coli, urine cultures with multiple species elevated procalcitonin at 16.27>>11.11. AKI resolved and mental status improving. ID was consulted -Continue with cefepime for more now -Follow-up final culture results

## 2022-09-12 NOTE — TOC Initial Note (Signed)
Transition of Care [facility]) - Initial/Assessment Note    Patient Details  Name: [name] MRN: [id number] Date of Birth: [birth year]  Transition of Care [facility]) CM/SW Contact:    Beverly Sessions, RN Phone Number: 09/12/2022, 11:52 AM  Clinical Narrative:                  Admitted DUK:GURKYH Admitted from:home health husband CWC:[facility]  Current home health/prior home health/DME:[facility] O2, RW, cane.  Patient states that her husband or daughter will transport at discharge.  She states that she will not need O2 for transport as she only wears it at night  Discussed option of home health for RN and PT.  Patient in agreement and patient states that she does not have a preference of home health agency. Referral [facility] to [facility]     Expected Discharge Plan: [place] Barriers to Discharge: Continued Medical Work up   Patient Goals and CMS Choice   CMS Medicare.gov Compare Post Acute Care list provided to:: Patient Choice offered to / list presented to : Patient      Expected Discharge Plan and Services     Post Acute Care Choice: Home Health                                        Prior Living Arrangements/Services   Lives with:: Spouse              Current home services: DME    Activities of Daily Living Home Assistive Devices/Equipment: Eyeglasses ADL Screening (condition at time of admission) Patient's cognitive ability adequate to safely complete daily activities?: Yes Is the patient deaf or have difficulty hearing?: No Does the patient have difficulty seeing, even when wearing glasses/contacts?: No Does the patient have difficulty concentrating, remembering, or making decisions?: No Patient able to express need for assistance with ADLs?: Yes Does the patient have difficulty dressing or bathing?: No Independently performs ADLs?: Yes (appropriate for developmental age) Does the  patient have difficulty walking or climbing stairs?: No Weakness of Legs: None Weakness of Arms/Hands: None  Permission Sought/Granted                  Emotional Assessment              Admission diagnosis:  Severe sepsis ([place]) [A41.9, R65.20] Sepsis, due to unspecified organism, unspecified whether acute organ dysfunction present [facility]) [A41.9] Patient Active Problem List   Diagnosis Date Noted   Severe sepsis ([place]) 09/10/2022   Vertigo 02/10/2022   Abnormal urinalysis 02/10/2022   TIA (transient ischemic attack) 10/04/2021   HTN (hypertension) 10/04/2021   Chronic kidney disease, stage 3a ([place]) 10/04/2021   Depression with anxiety 10/04/2021   COPD (chronic obstructive pulmonary disease) ([place]) 10/04/2021   CAD (coronary artery disease) 10/04/2021   Chronic diastolic CHF (congestive heart failure) ([place]) 10/04/2021   Type II diabetes mellitus with renal manifestations ([place]) 06/30/2021   Interstitial cystitis 06/30/2021   Atherosclerosis of aorta ([place]) 03/28/2021   Iron deficiency anemia due to chronic blood loss    Polyp of ascending colon    Vitamin B12 deficiency 08/08/2018   Patient is full code 10/30/2017   Coronary artery disease of native artery of native heart with stable angina pectoris ([place]) 10/09/2017   Plantar fascial fibromatosis 10/09/2017   Tendinitis of wrist 10/09/2017  Schizoaffective disorder, bipolar type ([place]) 09/28/2017   Adenomatous colon polyp 10/15/2015   Bipolar affective disorder ([place]) 10/15/2015   CN (constipation) 10/15/2015   HLD (hyperlipidemia) 10/15/2015   NASH (nonalcoholic steatohepatitis) 10/15/2015   Obesity (BMI 35.0-39.9 without comorbidity) 10/15/2015   Restless leg syndrome 10/15/2015   Low back pain with left-sided sciatica 08/18/2015   Apnea, sleep 05/05/2015   Heart valve disease 05/05/2015   CKD stage 3 due to type 2 diabetes mellitus ([place]) 03/15/2015   Migraine 03/12/2015   Chronic bronchitis ([place]) 01/20/2015    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[place]) 01/20/2015   GERD (gastroesophageal reflux disease) 01/20/2015   Hypothyroid    Vitamin D deficiency    Urinary incontinence    Liver lesion    Hypomagnesemia    Obstructive sleep apnea 10/20/2013   Allergic rhinitis 07/04/2007   PCP:  Steele Sizer, MD Pharmacy:   [place], [place] [address] [place] [address] Phone: [phone] Fax: [phone]  [id number] [address], [place] [address] [place] [address] Phone: [phone] Fax: [phone]     Social Determinants of Health ([place]) Social History: SDOH Screenings   Food Insecurity: Food Insecurity Present (02/15/2022)  Housing: Low Risk  (02/02/2022)  Transportation Needs: No Transportation Needs (02/02/2022)  Alcohol Screen: Low Risk  (02/21/2022)  Depression (PHQ2-9): Low Risk  (04/27/2022)  Recent Concern: Depression (PHQ2-9) - Medium Risk (02/21/2022)  Financial Resource Strain: Low Risk  (02/02/2022)  Physical Activity: Inactive (02/02/2022)  Social Connections: Moderately Isolated (02/02/2022)  Stress: No Stress Concern Present (02/02/2022)  Tobacco Use: Low Risk  (09/10/2022)   SDOH Interventions:     Readmission Risk Interventions    09/12/2022   11:51 AM 04/29/2020    9:49 AM  Readmission Risk Prevention Plan  Transportation Screening Complete Complete  HRI or Home Care Consult Complete   Social Work Consult for [place] Planning/Counseling Complete   Palliative Care Screening Not Applicable   Medication Review Press photographer) Complete Complete  PCP or Specialist appointment within 3-5 days of discharge  Complete  HRI or [place]  Complete  SW Recovery Care/Counseling Consult  Complete  [place]  Not Applicable

## 2022-09-12 NOTE — Assessment & Plan Note (Signed)
Appears to have CKD stage III A, had AKI on presentation, creatinine improving. -Monitor renal function -Avoid nephrotoxins

## 2022-09-12 NOTE — Evaluation (Signed)
Physical Therapy Evaluation Patient Details Name: KANDISS IHRIG MRN: [id number] DOB: December 01, 1965 Today's Date: 09/12/2022  History of Present Illness  Pt is a 57 y.o. female presenting to hospital 09/10/21 with c/o AMS, multiple falls, and fever; recently treated for influenza.  H/o COPD (wears 2-3 L home O2 at night).  Pt admitted with acute metabolic encephalopathy and acute pyelonephritis.  PMH includes bipolar disorder, systolic CHF, chronic migraines, CKD, COPD, CAD, DM with diabetic neuropathy, GERD, OSA.  Clinical Impression  Prior to hospital admission, pt was independent with functional mobility; lives with her husband and grandchildren; h/o recent falls (pt reports d/t sliding off of bed or going too fast when walking).  No c/o pain.  A&Ox4 but general confusion noted.  Currently pt is modified independent with bed mobility; CGA with transfers; and CGA to ambulate 200 feet (no AD use).  Pt requiring vc's intermittently to slow down during ambulation d/t safety concerns; pt also reporting LE's feeling weak while walking.  Pt would benefit from skilled PT to address noted impairments and functional limitations (see below for any additional details).  Upon hospital discharge, pt would benefit from [place].    Recommendations for follow up therapy are one component of a multi-disciplinary discharge planning process, led by the attending physician.  Recommendations may be updated based on patient status, additional functional criteria and insurance authorization.  Follow Up Recommendations Home health PT      Assistance Recommended at Discharge PRN  Patient can return home with the following  A little help with walking and/or transfers;A little help with bathing/dressing/bathroom;Assistance with cooking/housework;Assist for transportation;Help with stairs or ramp for entrance    Equipment Recommendations None recommended by PT  Recommendations for Other Services       Functional Status  Assessment Patient has had a recent decline in their functional status and demonstrates the ability to make significant improvements in function in a reasonable and predictable amount of time.     Precautions / Restrictions Precautions Precautions: Fall Restrictions Weight Bearing Restrictions: No      Mobility  Bed Mobility Overal bed mobility: Modified Independent             General bed mobility comments: HOB elevated; semi-supine to/from sitting    Transfers Overall transfer level: Needs assistance Equipment used: None Transfers: Sit to/from Stand Sit to Stand: Min guard           General transfer comment: CGA for safety but pt steady when standing    Ambulation/Gait Ambulation/Gait assistance: Min guard Gait Distance (Feet): 200 Feet Assistive device: None Gait Pattern/deviations: Step-through pattern       General Gait Details: overall steady gait but requiring intermittent vc's to slow down for safety  Stairs            Wheelchair Mobility    Modified Rankin (Stroke Patients Only)       Balance Overall balance assessment: Needs assistance Sitting-balance support: No upper extremity supported, Feet supported Sitting balance-Leahy Scale: Normal Sitting balance - Comments: steady sitting reaching outside BOS   Standing balance support: No upper extremity supported, During functional activity Standing balance-Leahy Scale: Good Standing balance comment: mostly steady during ambulation (concerns for safety d/t pt walking fast)                             Pertinent Vitals/Pain Pain Assessment Pain Assessment: No/denies pain Vitals (HR and O2 on room air) stable and WFL throughout  treatment session.  Pt preferring to keep 2 L O2 via nasal cannula donned when laying in bed (only uses O2 at night).    Home Living Family/patient expects to be discharged to:: Private residence Living Arrangements: Spouse/significant other;Other  relatives (grandchildren) Available Help at Discharge: Family;Available 24 hours/day;Available PRN/intermittently Type of Home: House Home Access: Stairs to enter Entrance Stairs-Rails: Psychiatric nurse of Steps: 5 Alternate Level Stairs-Number of Steps: 1 step down to bedroom Home Layout: Two level Home Equipment: Conservation officer, nature (2 wheels);BSC/3in1;Cane - single point      Prior Function Prior Level of Function : Independent/Modified Independent             Mobility Comments: Pt reports 5-6 recent falls d/t sliding off of bed or going too fast when walking.       Hand Dominance        Extremity/Trunk Assessment   Upper Extremity Assessment Upper Extremity Assessment: Overall WFL for tasks assessed    Lower Extremity Assessment Lower Extremity Assessment: Generalized weakness    Cervical / Trunk Assessment Cervical / Trunk Assessment: Normal  Communication   Communication: No difficulties  Cognition Arousal/Alertness: Awake/alert Behavior During Therapy: Impulsive Overall Cognitive Status: No family/caregiver present to determine baseline cognitive functioning                                 General Comments: Pt A&O x4 but some general confusion noted.        General Comments  Nursing cleared pt for participation in physical therapy.  Pt agreeable to PT session.    Exercises     Assessment/Plan    PT Assessment Patient needs continued PT services  PT Problem List Decreased strength;Decreased balance;Decreased mobility;Decreased cognition;Decreased safety awareness;Decreased knowledge of precautions       PT Treatment Interventions DME instruction;Gait training;Functional mobility training;Stair training;Therapeutic activities;Therapeutic exercise;Balance training;Patient/family education    PT Goals (Current goals can be found in the Care Plan section)  Acute Rehab PT Goals Patient Stated Goal: to go home PT Goal  Formulation: With patient Time For Goal Achievement: 09/26/22 Potential to Achieve Goals: Good    Frequency Min 2X/week     Co-evaluation               AM-PAC PT "6 Clicks" Mobility  Outcome Measure Help needed turning from your back to your side while in a flat bed without using bedrails?: None Help needed moving from lying on your back to sitting on the side of a flat bed without using bedrails?: None Help needed moving to and from a bed to a chair (including a wheelchair)?: A Little Help needed standing up from a chair using your arms (e.g., wheelchair or bedside chair)?: A Little Help needed to walk in hospital room?: A Little Help needed climbing 3-5 steps with a railing? : A Little 6 Click Score: 20    End of Session Equipment Utilized During Treatment: Gait belt Activity Tolerance: Patient tolerated treatment well Patient left: in bed;with call bell/phone within reach;with bed alarm set;Other (comment) (fall mat in place) Nurse Communication: Mobility status;Precautions PT Visit Diagnosis: Muscle weakness (generalized) (M62.81);History of falling (Z91.81)    Time: 8099-8338 PT Time Calculation (min) (ACUTE ONLY): 14 min   Charges:   PT Evaluation $PT Eval Low Complexity: 1 Low         Britten Seyfried, PT 09/12/22, 5:09 PM

## 2022-09-12 NOTE — Progress Notes (Signed)
Progress Note   Patient: [name] KGU:[id number] DOB: 06-09-1966 DOA: 09/10/2022     2 DOS: the patient was seen and examined on 09/12/2022   Brief hospital course: Taken from H&P.  [name] is a 57 y.o. female with medical history significant of bipolar disorder, systolic CHF, chronic migraines, chronic kidney disease COPD (2 to 3 L of oxygen at home), coronary artery disease, diabetes with diabetic neuropathy, GERD, obstructive sleep apnea and multiple other medical problems who was brought in with altered mental status.  Patient husband called EMS after patient had multiple falls at home.  This started last night and then this morning she was completely altered.   ED course and data reviewed.  Febrile with a recorded temperature of 103 otherwise hemodynamically stable.  Labs pertinent for BUN of 22 and creatinine of 1.47.  Lactic acid 1.1.  UA with moderate leukocytosis.  CT head and cervical spine was negative for any acute abnormality.  Chest x-ray with no active disease.  CT renal stone study with right-sided hydronephrosis without evidence of stones which could indicate recent passage of a stone versus pyelonephritis.  Mild splenomegaly.  Cultures were drawn and she was started on cefepime.  1/8: Vitals normal.  1 out of 4 blood culture bottles with Enterobacter -no definitive organism identified.  ID was consulted.  Urine cultures were added as add-on.  Procalcitonin elevated at 16.27.  CBC stable. Current respiratory viral panel negative, recent history of influenza B positive on 08/31/2022. PT ordered for history of falls   Assessment and Plan: * Severe sepsis ([place]) UTI and acute pyelonephritis. Acute metabolic encephalopathy. Patient met severe sepsis criteria with fever, tachycardia, tachypnea and end organ damage manifested as AKI and altered mental status.  Blood cultures with E. coli, urine cultures with multiple species elevated procalcitonin at 16.27>>11.11. AKI  resolved and mental status improving. ID was consulted -Continue with cefepime for more now -Follow-up final culture results  CKD stage 3 due to type 2 diabetes mellitus ([place]) Appears to have CKD stage III A, had AKI on presentation, creatinine improving. -Monitor renal function -Avoid nephrotoxins  HTN (hypertension)   Blood pressure within goal. -Continue with metoprolol -As needed hydralazine  Type II diabetes mellitus with renal manifestations ([place]) Patient was on Trulicity and Humulin at home. -A1c pending -Continue with SSI -Added Levemir 5 units at bedtime  Hypothyroid -Continue with Synthroid  Obesity (BMI 35.0-39.9 without comorbidity) Estimated body mass index is 36.15 kg/m as calculated from the following:   Height as of 08/31/22: '4\' 11"'$  (1.499 m).   Weight as of 08/31/22: 81.2 kg.   -Patient need counseling for weight loss  Schizoaffective disorder, bipolar type ([place]) On multiple psych meds at home which were resumed  GERD (gastroesophageal reflux disease) -Continue PPI  HLD (hyperlipidemia) -Continue statin, Zetia and fenofibrate  COPD (chronic obstructive pulmonary disease) ([place]) No current wheezing.  Patient mostly uses 2 to 3 L of oxygen while sleeping. Recent influenza B infection.  Chest x-ray normal -As needed bronchodilator -Continue nighttime O2  Chronic diastolic CHF (congestive heart failure) (HCC) Recent echocardiogram done in June 2023 with normal EF and grade 1 diastolic dysfunction.  Clinically euvolemic and normal BNP. Not on any diuretic. -Continue to monitor as patient is getting some IV fluid   Subjective: Patient was seen and examined today.  No new complaints.  She wants to go home.  Dysuria improving.  Physical Exam: Vitals:   09/11/22 2153 09/12/22 0404 09/12/22 6283 09/12/22 1529  BP: (!) 118/53 (!) 144/51 (!) 99/58 120/62  Pulse: 78 80 73 77  Resp:  18    Temp:  98 F (36.7 C)  98.4 F (36.9 C)  TempSrc:  Oral  Oral   SpO2:  98% 97% 99%  Weight:      Height:       General.  Obese lady, in no acute distress. Pulmonary.  Lungs clear bilaterally, normal respiratory effort. CV.  Regular rate and rhythm, no JVD, rub or murmur. Abdomen.  Soft, nontender, nondistended, BS positive. CNS.  Alert and oriented .  No focal neurologic deficit. Extremities.  No edema, no cyanosis, pulses intact and symmetrical. Psychiatry.  Judgment and insight appears normal. .   Data Reviewed: Prior data reviewed  Family Communication: Discussed with patient  Disposition: Status is: Inpatient Remains inpatient appropriate because: Severity of illness  Planned Discharge Destination: Home  DVT prophylaxis.  Lovenox Time spent: 45 minutes  This record has been created using Systems analyst. Errors have been sought and corrected,but may not always be located. Such creation errors do not reflect on the standard of care.   Author: Lorella Nimrod, MD 09/12/2022 3:37 PM  For on call review [email or web address].

## 2022-09-12 NOTE — Progress Notes (Signed)
Date of Admission:  09/10/2022   Total days of antibiotics ***        Day ***        Day ***        Day ***   ID: [name] is a 57 y.o. female with  *** Principal Problem:   Severe sepsis (HCC) Active Problems:   Hypothyroid   GERD (gastroesophageal reflux disease)   CKD stage 3 due to type 2 diabetes mellitus (HCC)   HLD (hyperlipidemia)   Obesity (BMI 35.0-39.9 without comorbidity)   Schizoaffective disorder, bipolar type (HCC)   Type II diabetes mellitus with renal manifestations (HCC)   HTN (hypertension)   COPD (chronic obstructive pulmonary disease) (HCC)   Chronic diastolic CHF (congestive heart failure) (HCC)    Subjective: ***  Medications:   atorvastatin  80 mg Oral QHS   busPIRone  30 mg Oral BID   citalopram  20 mg Oral Daily   clopidogrel  75 mg Oral Daily   enoxaparin (LOVENOX) injection  40 mg Subcutaneous Q24H   ezetimibe  10 mg Oral QHS   fenofibrate  54 mg Oral Daily   gabapentin  300 mg Oral BID   gabapentin  600 mg Oral QHS   insulin aspart  0-20 Units Subcutaneous TID WC   insulin aspart  0-5 Units Subcutaneous QHS   insulin detemir  5 Units Subcutaneous QHS   lamoTRIgine  200 mg Oral BID   levothyroxine  100 mcg Oral Q0600   linaclotide  145 mcg Oral QAC breakfast   metoprolol tartrate  25 mg Oral BID   pantoprazole  20 mg Oral Daily   QUEtiapine  300 mg Oral BID    Objective: Vital signs in last 24 hours: Temp:  [98 F (36.7 C)-98.2 F (36.8 C)] 98 F (36.7 C) (01/09 0404) Pulse Rate:  [72-85] 73 (01/09 0852) Resp:  [16-18] 18 (01/09 0404) BP: (99-144)/(51-68) 99/58 (01/09 0852) SpO2:  [94 %-100 %] 97 % (01/09 0852) Weight:  [83.3 kg] 83.3 kg (01/08 1700)  LDA Foley Central lines Other catheters  PHYSICAL EXAM:  General: Alert, cooperative, no distress, appears stated age.  Head: Normocephalic, without obvious abnormality, atraumatic. Eyes: Conjunctivae clear, anicteric sclerae. Pupils are equal ENT Nares normal. No  drainage or sinus tenderness. Lips, mucosa, and tongue normal. No Thrush Neck: Supple, symmetrical, no adenopathy, thyroid: non tender no carotid bruit and no JVD. Back: No CVA tenderness. Lungs: Clear to auscultation bilaterally. No Wheezing or Rhonchi. No rales. Heart: Regular rate and rhythm, no murmur, rub or gallop. Abdomen: Soft, non-tender,not distended. Bowel sounds normal. No masses Extremities: atraumatic, no cyanosis. No edema. No clubbing Skin: No rashes or lesions. Or bruising Lymph: Cervical, supraclavicular normal. Neurologic: Grossly non-focal  Lab Results Recent Labs    09/10/22 1350 09/10/22 1631 09/11/22 0805 09/12/22 0320  WBC 9.5  --  7.2  --   HGB 10.0*  --  10.0*  --   HCT 32.1*  --  32.0*  --   NA  --    < > 141 139  K  --    < > 4.2 4.1  CL  --    < > 102 100  CO2  --    < > 24 29  BUN  --    < > 26* 22*  CREATININE  --    < > 1.25* 1.01*   < > = values in this interval not displayed.   Liver Panel Recent  Labs    09/10/22 1631 09/11/22 0805  PROT 6.4* 6.6  ALBUMIN 2.9* 2.8*  AST 41 43*  ALT 30 32  ALKPHOS 49 45  BILITOT 2.0* 1.3*   Sedimentation Rate No results for input(s): "ESRSEDRATE" in the last 72 hours. C-Reactive Protein No results for input(s): "CRP" in the last 72 hours.  Microbiology:  Studies/Results: CT Renal Stone Study  Result Date: 09/10/2022 CLINICAL DATA:  Flank pain. EXAM: CT ABDOMEN AND PELVIS WITHOUT CONTRAST TECHNIQUE: Multidetector CT imaging of the abdomen and pelvis was performed following the standard protocol without IV contrast. RADIATION DOSE REDUCTION: This exam was performed according to the departmental dose-optimization program which includes automated exposure control, adjustment of the mA and/or kV according to patient size and/or use of iterative reconstruction technique. COMPARISON:  10/27/2019 FINDINGS: Lower chest: No acute abnormality. Hepatobiliary: No focal liver abnormality is seen. Status post  cholecystectomy. No biliary dilatation. Pancreas: Unremarkable. No pancreatic ductal dilatation or surrounding inflammatory changes. Spleen: Homogeneous in texture.  Mildly enlarged, 13.6 cm. Adrenals/Urinary Tract: Adrenal glands are unremarkable. No stones identified in the kidneys or ureters. On the left there is no hydronephrosis. On the right there is some minimal perinephric stranding and mild hydronephrosis. This could be due to recent stone with obstruction or pyelonephritis. Stomach/Bowel: Stomach is within normal limits. Appendix is not seen and there is no evidence for appendicitis. No evidence of bowel wall thickening, distention, or inflammatory changes. Vascular/Lymphatic: No significant vascular findings are present. No enlarged abdominal or pelvic lymph nodes. Reproductive:Unremarkable pelvic organs. Other: No abdominal wall hernia or abnormality. No abdominopelvic ascites. Musculoskeletal: No acute or significant osseous findings. IMPRESSION: 1. Right-sided hydronephrosis without evidence for stones which could indicate recent passage of a stone versus pyelonephritis. 2. Mild splenomegaly. Electronically Signed   By: Sammie Bench M.D.   On: 09/10/2022 16:50   CT Cervical Spine Wo Contrast  Result Date: 09/10/2022 CLINICAL DATA:  Neck trauma. EXAM: CT CERVICAL SPINE WITHOUT CONTRAST TECHNIQUE: Multidetector CT imaging of the cervical spine was performed without intravenous contrast. Multiplanar CT image reconstructions were also generated. RADIATION DOSE REDUCTION: This exam was performed according to the departmental dose-optimization program which includes automated exposure control, adjustment of the mA and/or kV according to patient size and/or use of iterative reconstruction technique. COMPARISON:  08/29/2022 FINDINGS: Alignment: Reversed cervical lordosis noted typically related to muscle spasm or positioning. No posterior element distraction or anterior translation to suggest an acute  traumatic etiology. Skull base and vertebrae: No acute fracture. No primary bone lesion or focal pathologic process. Soft tissues and spinal canal: No prevertebral fluid or swelling. No visible canal hematoma. Disc levels:  Maintained Upper chest: Negative. IMPRESSION: Unremarkable study. Electronically Signed   By: Sammie Bench M.D.   On: 09/10/2022 16:43   CT HEAD WO CONTRAST (5MM)  Result Date: 09/10/2022 CLINICAL DATA:  Mental status change, unknown cause EXAM: CT HEAD WITHOUT CONTRAST TECHNIQUE: Contiguous axial images were obtained from the base of the skull through the vertex without intravenous contrast. RADIATION DOSE REDUCTION: This exam was performed according to the departmental dose-optimization program which includes automated exposure control, adjustment of the mA and/or kV according to patient size and/or use of iterative reconstruction technique. COMPARISON:  08/29/2022 FINDINGS: Brain: There is periventricular white matter decreased attenuation consistent with small vessel ischemic changes. Ventricles, sulci and cisterns are prominent consistent with age related involutional changes. No acute intracranial hemorrhage, mass effect or shift. No hydrocephalus. Vascular: No hyperdense vessel or unexpected calcification. Skull: Normal.  Negative for fracture or focal lesion. Sinuses/Orbits: Sinuses are nearly completely opacified consistent with severe chronic pansinusitis with significant interval worsening. IMPRESSION: Atrophy and chronic small vessel ischemic changes. Severe chronic pansinusitis. No acute intracranial process identified. Electronically Signed   By: Sammie Bench M.D.   On: 09/10/2022 16:41     Assessment/Plan: ***

## 2022-09-13 DIAGNOSIS — R7881 Bacteremia: Secondary | ICD-10-CM | POA: Diagnosis not present

## 2022-09-13 DIAGNOSIS — G9340 Encephalopathy, unspecified: Secondary | ICD-10-CM

## 2022-09-13 DIAGNOSIS — B9620 Unspecified Escherichia coli [E. coli] as the cause of diseases classified elsewhere: Secondary | ICD-10-CM

## 2022-09-13 DIAGNOSIS — N390 Urinary tract infection, site not specified: Secondary | ICD-10-CM | POA: Diagnosis not present

## 2022-09-13 LAB — GLUCOSE, CAPILLARY
Glucose-Capillary: 203 mg/dL — ABNORMAL HIGH (ref 70–99)
Glucose-Capillary: 193 mg/dL — ABNORMAL HIGH (ref 70–99)
Glucose-Capillary: 165 mg/dL — ABNORMAL HIGH (ref 70–99)
Glucose-Capillary: 246 mg/dL — ABNORMAL HIGH (ref 70–99)

## 2022-09-13 LAB — PROCALCITONIN: Procalcitonin: 6.93 ng/mL

## 2022-09-13 MED ORDER — SALINE SPRAY 0.65 % NA SOLN: 1.0000 | NASAL | Status: DC | PRN

## 2022-09-13 MED ORDER — INSULIN DETEMIR 100 UNIT/ML ~~LOC~~ SOLN
5.0000 [IU] | Freq: Two times a day (BID) | SUBCUTANEOUS | Status: DC
  Administered 2022-09-13 – 2022-09-14 (×2): 5 [IU] via SUBCUTANEOUS
  Filled 2022-09-13 (×2): qty 0.05

## 2022-09-13 NOTE — Progress Notes (Signed)
Progress Note   Patient: [name] PFX:[id number] DOB: [birth year] DOA: 09/10/2022     3 DOS: the patient was seen and examined on 09/13/2022   Brief hospital course: Taken from H&P.  [name] is a 57 y.o. female with medical history significant of bipolar disorder, systolic CHF, chronic migraines, chronic kidney disease COPD (2 to 3 L of oxygen at home), coronary artery disease, diabetes with diabetic neuropathy, GERD, obstructive sleep apnea and multiple other medical problems who was brought in with altered mental status.  Patient husband called EMS after patient had multiple falls at home.  This started last night and then this morning she was completely altered.   ED course and data reviewed.  Febrile with a recorded temperature of 103 otherwise hemodynamically stable.  Labs pertinent for BUN of 22 and creatinine of 1.47.  Lactic acid 1.1.  UA with moderate leukocytosis.  CT head and cervical spine was negative for any acute abnormality.  Chest x-ray with no active disease.  CT renal stone study with right-sided hydronephrosis without evidence of stones which could indicate recent passage of a stone versus pyelonephritis.  Mild splenomegaly.  Cultures were drawn and she was started on cefepime.  1/8: Vitals normal.  1 out of 4 blood culture bottles with Enterobacter -no definitive organism identified.  ID was consulted.  Urine cultures were added as add-on.  Procalcitonin elevated at 16.27.  CBC stable. Current respiratory viral panel negative, recent history of influenza B positive on 08/31/2022. PT ordered for history of falls.  1/9: Hemodynamically stable.  Wants to go home.  Blood cultures growing E. coli-pending susceptibility.  1/10: Remained afebrile with stable vitals.  Procalcitonin improving appropriately, currently at 6.93 today, peaked at 16.27.Susceptibilities still pending.  Antibiotics switched with ceftriaxone. Developed some nausea and decreased appetite today. PT is  recommending home health which was ordered.   Assessment and Plan: * Severe sepsis ([place]) UTI and acute pyelonephritis. Acute metabolic encephalopathy. Patient met severe sepsis criteria with fever, tachycardia, tachypnea and end organ damage manifested as AKI and altered mental status.  Blood cultures with E. coli, pending susceptibilities.  Elevated procalcitonin at 16.27>>11.11>>6.93. AKI resolved and mental status improving. ID was consulted -Cefepime switched with ceftriaxone -Follow-up final culture results  CKD stage 3 due to type 2 diabetes mellitus (HCC) Appears to have CKD stage III A, had AKI on presentation, creatinine improving. -Monitor renal function -Avoid nephrotoxins  HTN (hypertension)   Blood pressure within goal. -Continue with metoprolol -As needed hydralazine  Type II diabetes mellitus with renal manifestations ([place]) Patient was on Trulicity and Humulin at home. -A1c elevated at 8, CBG remained elevated. -Continue with SSI -Increase Levemir to 5 units twice daily  Hypothyroid -Continue with Synthroid  Obesity (BMI 35.0-39.9 without comorbidity) Estimated body mass index is 36.15 kg/m as calculated from the following:   Height as of 08/31/22: '4\' 11"'$  (1.499 m).   Weight as of 08/31/22: 81.2 kg.   -Patient need counseling for weight loss  Schizoaffective disorder, bipolar type ([place]) On multiple psych meds at home which were resumed  GERD (gastroesophageal reflux disease) -Continue PPI  HLD (hyperlipidemia) -Continue statin, Zetia and fenofibrate  COPD (chronic obstructive pulmonary disease) ([place]) No current wheezing.  Patient mostly uses 2 to 3 L of oxygen while sleeping. Recent influenza B infection.  Chest x-ray normal -As needed bronchodilator -Continue nighttime O2  Chronic diastolic CHF (congestive heart failure) (HCC) Recent echocardiogram done in June 2023 with normal EF and grade 1  diastolic dysfunction.  Clinically euvolemic and  normal BNP. Not on any diuretic. -Continue to monitor as patient is getting some IV fluid   Subjective: Patient is complaining of poor appetite and nausea but no vomiting today.  Dysuria resolved.  Physical Exam: Vitals:   09/12/22 1529 09/12/22 1936 09/13/22 0322 09/13/22 0733  BP: 120/62 (!) 126/57 (!) 107/56 135/64  Pulse: 77 86 71 72  Resp:  '20 18 18  '$ Temp: 98.4 F (36.9 C) 98.4 F (36.9 C) 98.3 F (36.8 C) 98 F (36.7 C)  TempSrc: Oral   Oral  SpO2: 99% 99% 99% 99%  Weight:      Height:       General.  Obese lady, in no acute distress. Pulmonary.  Lungs clear bilaterally, normal respiratory effort. CV.  Regular rate and rhythm, no JVD, rub or murmur. Abdomen.  Soft, nontender, nondistended, BS positive. CNS.  Alert and oriented .  No focal neurologic deficit. Extremities.  No edema, no cyanosis, pulses intact and symmetrical. Psychiatry.  Judgment and insight appears normal. .   Data Reviewed: Prior data reviewed  Family Communication: Discussed with husband on phone Disposition: Status is: Inpatient Remains inpatient appropriate because: Severity of illness  Planned Discharge Destination: Home  DVT prophylaxis.  Lovenox Time spent: 44 minutes  This record has been created using Systems analyst. Errors have been sought and corrected,but may not always be located. Such creation errors do not reflect on the standard of care.   Author: Lorella Nimrod, MD 09/13/2022 3:31 PM  For on call review [email or web address].

## 2022-09-13 NOTE — Progress Notes (Signed)
Date of Admission:  09/10/2022      ID: [name] is a 57 y.o. female Principal Problem:   Severe sepsis ([place]) Active Problems:   Hypothyroid   GERD (gastroesophageal reflux disease)   CKD stage 3 due to type 2 diabetes mellitus (HCC)   Complicated UTI (urinary tract infection)   HLD (hyperlipidemia)   Obesity (BMI 35.0-39.9 without comorbidity)   Schizoaffective disorder, bipolar type (HCC)   Type II diabetes mellitus with renal manifestations (HCC)   HTN (hypertension)   COPD (chronic obstructive pulmonary disease) (HCC)   Chronic diastolic CHF (congestive heart failure) (HCC)   Bacteremia due to Escherichia coli   Encephalopathy acute    Subjective: Has cough  Medications:   atorvastatin  80 mg Oral QHS   busPIRone  30 mg Oral BID   citalopram  20 mg Oral Daily   clopidogrel  75 mg Oral Daily   enoxaparin (LOVENOX) injection  40 mg Subcutaneous Q24H   ezetimibe  10 mg Oral QHS   fenofibrate  54 mg Oral Daily   gabapentin  300 mg Oral BID   gabapentin  600 mg Oral QHS   insulin aspart  0-20 Units Subcutaneous TID WC   insulin aspart  0-5 Units Subcutaneous QHS   insulin detemir  5 Units Subcutaneous QHS   lamoTRIgine  200 mg Oral BID   levothyroxine  100 mcg Oral Q0600   linaclotide  145 mcg Oral QAC breakfast   metoprolol tartrate  25 mg Oral BID   pantoprazole  20 mg Oral Daily   QUEtiapine  300 mg Oral BID    Objective: Vital signs in last 24 hours: Temp:  [98 F (36.7 C)-98.4 F (36.9 C)] 98 F (36.7 C) (01/10 0733) Pulse Rate:  [71-86] 72 (01/10 0733) Resp:  [18-20] 18 (01/10 0733) BP: (107-135)/(56-64) 135/64 (01/10 0733) SpO2:  [99 %] 99 % (01/10 0733)    PHYSICAL EXAM:  General: Alert, cooperative, no distress, appears stated age.  Head: Normocephalic, without obvious abnormality, atraumatic. Eyes: Conjunctivae clear, anicteric sclerae. Pupils are equal ENT Nares normal. No drainage or sinus tenderness. Lips, mucosa, and tongue normal.  No Thrush Neck: Supple, symmetrical, no adenopathy, thyroid: non tender no carotid bruit and no JVD. Back: No CVA tenderness. Lungs: Clear to auscultation bilaterally. No Wheezing or Rhonchi. No rales. Heart: Regular rate and rhythm, no murmur, rub or gallop. Abdomen: Soft, non-tender,not distended. Bowel sounds normal. No masses Extremities: atraumatic, no cyanosis. No edema. No clubbing Skin: No rashes or lesions. Or bruising Lymph: Cervical, supraclavicular normal. Neurologic: Grossly non-focal  Lab Results Recent Labs    09/10/22 1350 09/10/22 1631 09/11/22 0805 09/12/22 0320  WBC 9.5  --  7.2  --   HGB 10.0*  --  10.0*  --   HCT 32.1*  --  32.0*  --   NA  --    < > 141 139  K  --    < > 4.2 4.1  CL  --    < > 102 100  CO2  --    < > 24 29  BUN  --    < > 26* 22*  CREATININE  --    < > 1.25* 1.01*   < > = values in this interval not displayed.   Liver Panel Recent Labs    09/10/22 1631 09/11/22 0805  PROT 6.4* 6.6  ALBUMIN 2.9* 2.8*  AST 41 43*  ALT 30 32  ALKPHOS 49 45  BILITOT 2.0* 1.3*   Sedimentation Rate No results for input(s): "ESRSEDRATE" in the last 72 hours. C-Reactive Protein No results for input(s): "CRP" in the last 72 hours.  Microbiology: BC- e.coli Studies/Results: No results found.   Assessment/Plan: Ecoli bacteremia  Due to complicated UTI Rt pyelo/mild hydronephrosis Has bladder stim No residual volume on bladder scan On ceftriaxone Await susceptibility to decide on PO antibiotic  Recent Flu B resp illness- improving  Acute metabolic encephalopathy resolved  DM on insulin  COPD  OSA  Discussed the management with patient and care team

## 2022-09-13 NOTE — Plan of Care (Signed)
  Problem: Coping: Goal: Ability to adjust to condition or change in health will improve Outcome: Progressing   Problem: Nutritional: Goal: Maintenance of adequate nutrition will improve Outcome: Progressing   Problem: Fluid Volume: Goal: Hemodynamic stability will improve Outcome: Progressing   Problem: Clinical Measurements: Goal: Respiratory complications will improve Outcome: Progressing   Problem: Activity: Goal: Risk for activity intolerance will decrease Outcome: Progressing   Problem: Nutrition: Goal: Adequate nutrition will be maintained Outcome: Progressing   Problem: Safety: Goal: Ability to remain free from injury will improve Outcome: Progressing

## 2022-09-13 NOTE — Care Management Important Message (Signed)
Important Message  Patient Details  Name: [name] MRN: [id number] Date of Birth: July 18, 1966   Medicare Important Message Given:  Yes  Patient asleep upon time of visit.  Copy of Medicare IM left on beside tray for reference.    Dannette Barbara 09/13/2022, 10:19 AM

## 2022-09-13 NOTE — Assessment & Plan Note (Signed)
Patient was on Trulicity and Humulin at home. -A1c elevated at 8, CBG remained elevated. -Continue with SSI -Increase Levemir to 5 units twice daily

## 2022-09-14 DIAGNOSIS — E1122 Type 2 diabetes mellitus with diabetic chronic kidney disease: Secondary | ICD-10-CM | POA: Diagnosis not present

## 2022-09-14 DIAGNOSIS — I10 Essential (primary) hypertension: Secondary | ICD-10-CM | POA: Diagnosis not present

## 2022-09-14 DIAGNOSIS — A419 Sepsis, unspecified organism: Secondary | ICD-10-CM | POA: Diagnosis not present

## 2022-09-14 DIAGNOSIS — N390 Urinary tract infection, site not specified: Secondary | ICD-10-CM | POA: Diagnosis not present

## 2022-09-14 LAB — CULTURE, BLOOD (ROUTINE X 2): Special Requests: ADEQUATE

## 2022-09-14 LAB — URINE CULTURE: Culture: 3000 — AB

## 2022-09-14 LAB — GLUCOSE, CAPILLARY
Glucose-Capillary: 239 mg/dL — ABNORMAL HIGH (ref 70–99)
Glucose-Capillary: 302 mg/dL — ABNORMAL HIGH (ref 70–99)

## 2022-09-14 MED ORDER — INSULIN DETEMIR 100 UNIT/ML ~~LOC~~ SOLN
8.0000 [IU] | Freq: Two times a day (BID) | SUBCUTANEOUS | Status: DC
  Filled 2022-09-14: qty 0.08

## 2022-09-14 MED ORDER — LEVOFLOXACIN 750 MG PO TABS: 750.0000 mg | ORAL_TABLET | Freq: Every day | ORAL | Status: DC

## 2022-09-14 MED ORDER — LEVOFLOXACIN 750 MG PO TABS: 750.0000 mg | ORAL_TABLET | Freq: Every day | ORAL | 0 refills | Status: AC

## 2022-09-14 NOTE — Progress Notes (Signed)
Discharge instructions reviewed with patient including followup visits and new medications.  Understanding was verbalized and all questions were answered.  IV removed without complication; patient tolerated well.  Patient discharged home via wheelchair in stable condition escorted by volunteer staff.  

## 2022-09-14 NOTE — TOC Transition Note (Signed)
Transition of Care [facility]) - CM/SW Discharge Note   Patient Details  Name: [name] MRN: [id number] Date of Birth: [birth year]  Transition of Care [facility]) CM/SW Contact:  Beverly Sessions, RN Phone Number: 09/14/2022, 11:32 AM   Clinical Narrative:     Patient to discharge today [name] with [facility] notified of discharge     Barriers to Discharge: Continued Medical Work up   Patient Goals and CMS Choice CMS Medicare.gov Compare Post Acute Care list provided to:: Patient Choice offered to / list presented to : Patient  Discharge Placement                         Discharge Plan and Services Additional resources added to the After Visit Summary for       Post Acute Care Choice: Home Health                               Social Determinants of Health (SDOH) Interventions SDOH Screenings   Food Insecurity: Food Insecurity Present (02/15/2022)  Housing: Low Risk  (02/02/2022)  Transportation Needs: No Transportation Needs (02/02/2022)  Alcohol Screen: Low Risk  (02/21/2022)  Depression (PHQ2-9): Low Risk  (04/27/2022)  Recent Concern: Depression (PHQ2-9) - Medium Risk (02/21/2022)  Financial Resource Strain: Low Risk  (02/02/2022)  Physical Activity: Inactive (02/02/2022)  Social Connections: Moderately Isolated (02/02/2022)  Stress: No Stress Concern Present (02/02/2022)  Tobacco Use: Low Risk  (09/10/2022)     Readmission Risk Interventions    09/12/2022   11:51 AM 04/29/2020    9:49 AM  Readmission Risk Prevention Plan  Transportation Screening Complete Complete  HRI or Home Care Consult Complete   Social Work Consult for [place] Planning/Counseling Complete   Palliative Care Screening Not Applicable   Medication Review Press photographer) Complete Complete  PCP or Specialist appointment within 3-5 days of discharge  Complete  HRI or [place]  Complete  SW Recovery Care/Counseling Consult  Complete  [place]  Not Applicable

## 2022-09-14 NOTE — Discharge Instructions (Signed)
Food Resources  Agency Name: [facility] Address: [address], [place], [place] [address] Phone: [phone] Website: www.alamanceservices.org  Service(s) Offered: Housing services, self-sufficiency, congregate meal  program, weatherization program, heating appliance  repair/replacement program, emergency food assistance,  housing counseling, home ownership program, wheels -towork program. Meals free for 60 and older at various  locations from 9am-1pm, Monday-Friday:  [facility], [address] [place], [phone] [place]   [facility], [address] [place] [phone]   [facility], [address]   [place] [phone] [facility], [address]   [place], [phone]  Agency Name: [facility] Address: [address], [place], [place] [address] Phone: [phone] Website: www.alamancemow.org Service(s) Offered: Home delivered hot, frozen, and emergency  meals. Grocery assistance program which matches  volunteers one-on-one with seniors unable to grocery shop  for themselves. Must be 60 years and older; less than 20  hours of in-home aide service, limited or no driving ability;  live alone or with someone with a disability; live in  [place].  Agency Name: [facility] [employer] [facility] [employer] Address: [address] [place], [place] [address] Phone: [phone] Service(s) Offered: Food is served to shut-ins, homeless, elderly, and low  income people in the community every Saturday (11:30  am-12:30 pm) and Sunday (12:30 pm-1:30pm). Volunteers  also offer help and encouragement in seeking employment,  and spiritual guidance. December 28, 2016 8  Agency Name: [facility] [employer] [facility] Address: [address] [place], [place], [place] [address] Phone: [phone] Service(s) Offered: Child support services; child welfare services; food stamps;  Medicaid;  work first family assistance; and aid with fuel,  rent, food and medicine.  Agency Name: [facility] Address: [address] [place], [place] Phone: [phone] Website: www.dreamalign.com Services Offered: Monday 10:00am-12:00, 8:00pm-9:00pm, and Friday  10:00am-12:00. Agency Name: [facility] Address: [address], [place], [place] [address] Phone: [phone] Website: www.[email or web address].org Service(s) Offered: Serves weekday meals, open from 11:30 am- 1:00 pm., and  6:30-7:30pm, Monday-Wednesday-Friday distributes food  3:30-6pm, Monday-Wednesday-Friday.  Agency Name: [facility] Address: [address], [place], [place] Phone: [phone] Website: [email or web address] Services Offered: Distributes food the 4th Saturday of the month, starting at  8:00 am Agency Name: [facility] Address: [address], [place], [place] [address] Phone: [phone] Website: [email or web address]://[email or web address].[email or web address].net Service(s) Offered: [employer], weekly food pantry. Open Wednesdays from  10:00am-noon.  Agency Name: [facility] Address: [address] [place] [address], [place], [place] Phone: [phone] Services Offered: Distributes food 9am-1pm, Monday-Thursday. Call for details.   Agency Name: [facility] Address: [address] [place], [place] [address] Phone: [phone] Website: [email or web address] Service(s) Offered: Emergency Food Pantry. Call for assistance. Agency Name: [facility] Address: [address], [place], [place] [address] Phone: [phone] Service Offered: Emergency Food Pantry. Call for appointment.  Agency Name: [facility] Address: [address] [place], [place] [address] Phone: [phone] Website: [email or web address] Services Offered: Emergency Food Pantry. Call for details Agency Name: [facility] Address: [address] [place], [place] Phone: [phone] Website: [email or web address] Service(s) Offered: Emergency Food Pantry. Call for details.  Agency Name: [facility] Address: [address], [place], [place] [address] Phone: [phone] or [phone] Website: [email or web address] Service(s) Offered: Distribute food 9am-11:30 am, Tuesday-Friday, and 1- 3:30pm, Monday-Friday. Food pantry Monday-Friday  1pm-3pm, fresh items, Mon.-Wed.-Fri.  Agency Name: [facility] Address: [address] [place], [place] [address] Phone: [phone] Website: www.safealamance.org Services Offered: Distribute food Tues and Sats from 9:00am-noon. Closed  1st Saturday of each month. Call for details    

## 2022-09-14 NOTE — Discharge Summary (Signed)
Physician Discharge Summary   Patient: [name] MRN: [id number] DOB: 1965/10/23  Admit date:     09/10/2022  Discharge date: 09/14/22  Discharge Physician: Lorella Nimrod   PCP: Steele Sizer, MD   Recommendations at discharge:  Please obtain CBC and BMP in 1 week Follow-up with primary care provider within a week  Discharge Diagnoses: Principal Problem:   Severe sepsis ([place]) Active Problems:   CKD stage 3 due to type 2 diabetes mellitus ([place])   HTN (hypertension)   Type II diabetes mellitus with renal manifestations ([place])   Hypothyroid   Obesity (BMI 35.0-39.9 without comorbidity)   Schizoaffective disorder, bipolar type (HCC)   GERD (gastroesophageal reflux disease)   HLD (hyperlipidemia)   COPD (chronic obstructive pulmonary disease) (HCC)   Chronic diastolic CHF (congestive heart failure) ([place])   Complicated UTI (urinary tract infection)   Bacteremia due to Escherichia coli   Encephalopathy acute   Hospital Course: Taken from H&P.  [name] is a 57 y.o. female with medical history significant of bipolar disorder, systolic CHF, chronic migraines, chronic kidney disease COPD (2 to 3 L of oxygen at home), coronary artery disease, diabetes with diabetic neuropathy, GERD, obstructive sleep apnea and multiple other medical problems who was brought in with altered mental status.  Patient husband called EMS after patient had multiple falls at home.  This started last night and then this morning she was completely altered.   ED course and data reviewed.  Febrile with a recorded temperature of 103 otherwise hemodynamically stable.  Labs pertinent for BUN of 22 and creatinine of 1.47.  Lactic acid 1.1.  UA with moderate leukocytosis.  CT head and cervical spine was negative for any acute abnormality.  Chest x-ray with no active disease.  CT renal stone study with right-sided hydronephrosis without evidence of stones which could indicate recent passage of a stone versus  pyelonephritis.  Mild splenomegaly.  Cultures were drawn and she was started on cefepime.  1/8: Vitals normal.  1 out of 4 blood culture bottles with Enterobacter -no definitive organism identified.  ID was consulted.  Urine cultures were added as add-on.  Procalcitonin elevated at 16.27.  CBC stable. Current respiratory viral panel negative, recent history of influenza B positive on 08/31/2022. PT ordered for history of falls.  1/9: Hemodynamically stable.  Wants to go home.  Blood cultures growing E. coli-pending susceptibility.  1/10: Remained afebrile with stable vitals.  Procalcitonin improving appropriately, currently at 6.93 today, peaked at 16.27.Susceptibilities still pending.  Antibiotics switched with ceftriaxone. Developed some nausea and decreased appetite today. PT is recommending home health which was ordered.  1/11: Vitals stable..  CBG elevated at 239-increasing the dose of Levemir to 8 units twice daily. Repeat urine cultures which were done after couple of days of antibiotics with 3000 colonies of pansensitive E. coli.  Blood cultures with pansensitive E. coli too.  Patient received cefepime followed by ceftriaxone while in the hospital and discharged on 4 more days of Levaquin as advised by infectious disease.  She will continue with rest of her home medications and need to have a close follow-up with her providers for further recommendations.  Assessment and Plan: * Severe sepsis ([place]) UTI and acute pyelonephritis. Acute metabolic encephalopathy. Patient met severe sepsis criteria with fever, tachycardia, tachypnea and end organ damage manifested as AKI and altered mental status.  Blood cultures with E. coli, pending susceptibilities.  Elevated procalcitonin at 16.27>>11.11>>6.93. AKI resolved and mental status improving. ID was consulted -  Cefepime switched with ceftriaxone -Follow-up final culture results  CKD stage 3 due to type 2 diabetes mellitus (HCC) Appears to  have CKD stage III A, had AKI on presentation, creatinine improving. -Monitor renal function -Avoid nephrotoxins  HTN (hypertension)   Blood pressure within goal. -Continue with metoprolol -As needed hydralazine  Type II diabetes mellitus with renal manifestations ([place]) Patient was on Trulicity and Humulin at home. -A1c elevated at 8, CBG remained elevated. -Continue with SSI -Increase Levemir to 5 units twice daily  Hypothyroid -Continue with Synthroid  Obesity (BMI 35.0-39.9 without comorbidity) Estimated body mass index is 36.15 kg/m as calculated from the following:   Height as of 08/31/22: '4\' 11"'$  (1.499 m).   Weight as of 08/31/22: 81.2 kg.   -Patient need counseling for weight loss  Schizoaffective disorder, bipolar type ([place]) On multiple psych meds at home which were resumed  GERD (gastroesophageal reflux disease) -Continue PPI  HLD (hyperlipidemia) -Continue statin, Zetia and fenofibrate  COPD (chronic obstructive pulmonary disease) ([place]) No current wheezing.  Patient mostly uses 2 to 3 L of oxygen while sleeping. Recent influenza B infection.  Chest x-ray normal -As needed bronchodilator -Continue nighttime O2  Chronic diastolic CHF (congestive heart failure) (HCC) Recent echocardiogram done in June 2023 with normal EF and grade 1 diastolic dysfunction.  Clinically euvolemic and normal BNP. Not on any diuretic. -Continue to monitor as patient is getting some IV fluid   Consultants: Infectious disease Procedures performed: None Disposition: Home health Diet recommendation:  Discharge Diet Orders (From admission, onward)     Start     Ordered   09/14/22 0000  Diet - low sodium heart healthy        09/14/22 1123           Cardiac and Carb modified diet DISCHARGE MEDICATION: Allergies as of 09/14/2022       Reactions   Mirtazapine Anaphylaxis   REACTION: closes throat   Morphine And Related    anaphylaxis   Sulfamethoxazole-trimethoprim Rash    REACTION: [place] all over   Amoxicillin Er Nausea And Vomiting   Pt doesn't remember problem with amoxicillin -  SE Has patient had a PCN reaction causing immediate rash, facial/tongue/throat swelling, SOB or lightheadedness with hypotension: No Has patient had a PCN reaction causing severe rash involving mucus membranes or skin necrosis: No Has patient had a PCN reaction that required hospitalization: No Has patient had a PCN reaction occurring within the last 10 years: Yes If all of the above answers are "NO", then may proceed with Cephalosporin use.   Codeine Itching   REACTION: itch, rash   Hydrocodone-acetaminophen Rash   REACTION: rash   Keflex [cephalexin] Rash   Pt does not remember having a reaction or rash, no airway involvement   Prasterone Nausea And Vomiting        Medication List     TAKE these medications    atorvastatin 80 MG tablet Commonly known as: LIPITOR Take 80 mg by mouth at bedtime.   busPIRone 30 MG tablet Commonly known as: BUSPAR Take 30 mg by mouth 2 (two) times daily.   citalopram 20 MG tablet Commonly known as: CELEXA Take 20 mg by mouth daily.   clopidogrel 75 MG tablet Commonly known as: Plavix Take 1 tablet (75 mg total) by mouth daily.   ezetimibe 10 MG tablet Commonly known as: ZETIA Take 10 mg by mouth at bedtime.   fenofibrate micronized 134 MG capsule Commonly known as: LOFIBRA Take 134 mg  by mouth at bedtime.   fluticasone 50 MCG/ACT nasal spray Commonly known as: FLONASE PLACE 2 SPRAYS INTO BOTH NOSTRILS DAILY   gabapentin 300 MG capsule Commonly known as: NEURONTIN Take 300 mg by mouth 2 (two) times daily.   gabapentin 600 MG tablet Commonly known as: NEURONTIN Take 600 mg by mouth at bedtime.   Glyxambi 25-5 MG Tabs Generic drug: Empagliflozin-linaGLIPtin Take 1 tablet by mouth daily.   HumuLIN R U-500 KwikPen 500 UNIT/ML KwikPen Generic drug: insulin regular human CONCENTRATED Inject 70 Units into the skin  3 (three) times daily with meals.   Insulin Aspart FlexPen 100 UNIT/ML Commonly known as: NOVOLOG Inject into the skin.   lamoTRIgine 200 MG tablet Commonly known as: LAMICTAL Take 200 mg by mouth 2 (two) times daily.   levofloxacin 750 MG tablet Commonly known as: LEVAQUIN Take 1 tablet (750 mg total) by mouth at bedtime for 4 days.   Linzess 145 MCG Caps capsule Generic drug: linaclotide TAKE 1 CAPSULE BY MOUTH DAILY BEFORE BREAKFAST What changed: See the new instructions.   meclizine 25 MG tablet Commonly known as: ANTIVERT TAKE 1 TABLET BY MOUTH 3 TIMES DAILY AS NEEDED FOR DIZZINESS OR NAUSEA   metoprolol tartrate 25 MG tablet Commonly known as: LOPRESSOR Take 25 mg by mouth 2 (two) times daily.   nitroGLYCERIN 0.4 MG SL tablet Commonly known as: NITROSTAT Place 1 tablet (0.4 mg total) under the tongue every 5 (five) minutes as needed for chest pain.   ondansetron 4 MG disintegrating tablet Commonly known as: ZOFRAN-ODT Take 1 tablet (4 mg total) by mouth every 8 (eight) hours as needed for nausea or vomiting.   OneTouch Delica Lancets 10F Misc by Does not apply route.   Pen Needles 31G X 8 MM Misc   promethazine 25 MG suppository Commonly known as: PHENERGAN Place 1 suppository (25 mg total) rectally every 6 (six) hours as needed for nausea or vomiting.   QUEtiapine 300 MG tablet Commonly known as: SEROQUEL Take 300 mg by mouth at bedtime. Along with '200mg'$  dose for a total of '500mg'$    SUMAtriptan 100 MG tablet Commonly known as: IMITREX May repeat in 2 hours if headache persists or recurs.   Synthroid 100 MCG tablet Generic drug: levothyroxine Take 100 mcg by mouth daily.   Trulicity 1.5 BP/1.0CH Sopn Generic drug: Dulaglutide Inject 1.5 mg into the skin once a week. (Wednesdays)   Ubrelvy 100 MG Tabs Generic drug: Ubrogepant Take 1 tablet by mouth every other day.        Follow-up Information     Steele Sizer, MD. Schedule an appointment as  soon as possible for a visit in 1 week(s).   Specialty: Family Medicine Contact information: [address] [place] [address] [phone]                Discharge Exam: Danley Danker Weights   09/11/22 1700  Weight: 83.3 kg   General.     In no acute distress. Pulmonary.  Lungs clear bilaterally, normal respiratory effort. CV.  Regular rate and rhythm, no JVD, rub or murmur. Abdomen.  Soft, nontender, nondistended, BS positive. CNS.  Alert and oriented .  No focal neurologic deficit. Extremities.  No edema, no cyanosis, pulses intact and symmetrical. Psychiatry.  Judgment and insight appears normal.   Condition at discharge: stable  The results of significant diagnostics from this hospitalization (including imaging, microbiology, ancillary and laboratory) are listed below for reference.   Imaging Studies: CT Renal Stone  Study  Result Date: 09/10/2022 CLINICAL DATA:  Flank pain. EXAM: CT ABDOMEN AND PELVIS WITHOUT CONTRAST TECHNIQUE: Multidetector CT imaging of the abdomen and pelvis was performed following the standard protocol without IV contrast. RADIATION DOSE REDUCTION: This exam was performed according to the departmental dose-optimization program which includes automated exposure control, adjustment of the mA and/or kV according to patient size and/or use of iterative reconstruction technique. COMPARISON:  10/27/2019 FINDINGS: Lower chest: No acute abnormality. Hepatobiliary: No focal liver abnormality is seen. Status post cholecystectomy. No biliary dilatation. Pancreas: Unremarkable. No pancreatic ductal dilatation or surrounding inflammatory changes. Spleen: Homogeneous in texture.  Mildly enlarged, 13.6 cm. Adrenals/Urinary Tract: Adrenal glands are unremarkable. No stones identified in the kidneys or ureters. On the left there is no hydronephrosis. On the right there is some minimal perinephric stranding and mild hydronephrosis. This could be due to recent stone  with obstruction or pyelonephritis. Stomach/Bowel: Stomach is within normal limits. Appendix is not seen and there is no evidence for appendicitis. No evidence of bowel wall thickening, distention, or inflammatory changes. Vascular/Lymphatic: No significant vascular findings are present. No enlarged abdominal or pelvic lymph nodes. Reproductive:Unremarkable pelvic organs. Other: No abdominal wall hernia or abnormality. No abdominopelvic ascites. Musculoskeletal: No acute or significant osseous findings. IMPRESSION: 1. Right-sided hydronephrosis without evidence for stones which could indicate recent passage of a stone versus pyelonephritis. 2. Mild splenomegaly. Electronically Signed   By: Sammie Bench M.D.   On: 09/10/2022 16:50   CT Cervical Spine Wo Contrast  Result Date: 09/10/2022 CLINICAL DATA:  Neck trauma. EXAM: CT CERVICAL SPINE WITHOUT CONTRAST TECHNIQUE: Multidetector CT imaging of the cervical spine was performed without intravenous contrast. Multiplanar CT image reconstructions were also generated. RADIATION DOSE REDUCTION: This exam was performed according to the departmental dose-optimization program which includes automated exposure control, adjustment of the mA and/or kV according to patient size and/or use of iterative reconstruction technique. COMPARISON:  08/29/2022 FINDINGS: Alignment: Reversed cervical lordosis noted typically related to muscle spasm or positioning. No posterior element distraction or anterior translation to suggest an acute traumatic etiology. Skull base and vertebrae: No acute fracture. No primary bone lesion or focal pathologic process. Soft tissues and spinal canal: No prevertebral fluid or swelling. No visible canal hematoma. Disc levels:  Maintained Upper chest: Negative. IMPRESSION: Unremarkable study. Electronically Signed   By: Sammie Bench M.D.   On: 09/10/2022 16:43   CT HEAD WO CONTRAST (5MM)  Result Date: 09/10/2022 CLINICAL DATA:  Mental status  change, unknown cause EXAM: CT HEAD WITHOUT CONTRAST TECHNIQUE: Contiguous axial images were obtained from the base of the skull through the vertex without intravenous contrast. RADIATION DOSE REDUCTION: This exam was performed according to the departmental dose-optimization program which includes automated exposure control, adjustment of the mA and/or kV according to patient size and/or use of iterative reconstruction technique. COMPARISON:  08/29/2022 FINDINGS: Brain: There is periventricular white matter decreased attenuation consistent with small vessel ischemic changes. Ventricles, sulci and cisterns are prominent consistent with age related involutional changes. No acute intracranial hemorrhage, mass effect or shift. No hydrocephalus. Vascular: No hyperdense vessel or unexpected calcification. Skull: Normal. Negative for fracture or focal lesion. Sinuses/Orbits: Sinuses are nearly completely opacified consistent with severe chronic pansinusitis with significant interval worsening. IMPRESSION: Atrophy and chronic small vessel ischemic changes. Severe chronic pansinusitis. No acute intracranial process identified. Electronically Signed   By: Sammie Bench M.D.   On: 09/10/2022 16:41   DG Chest Port 1 View  Result Date: 09/10/2022 CLINICAL DATA:  Questionable sepsis EXAM: PORTABLE CHEST 1 VIEW COMPARISON:  August 31, 2022 FINDINGS: The heart size and mediastinal contours are within normal limits. Both lungs are clear. The visualized skeletal structures are unremarkable. IMPRESSION: No active disease. Electronically Signed   By: Dorise Bullion III M.D.   On: 09/10/2022 14:21   DG Chest 2 View  Result Date: 08/31/2022 CLINICAL DATA:  Fever cough and flu EXAM: CHEST - 2 VIEW COMPARISON:  Chest 08/29/2022 FINDINGS: Improvement in left lower lobe airspace disease. No effusion. Right lung clear. Negative for heart failure. IMPRESSION: Improvement in left lower lobe airspace disease. Electronically Signed    By: Franchot Gallo M.D.   On: 08/31/2022 08:01   DG Chest 2 View  Result Date: 08/29/2022 CLINICAL DATA:  Cough.  Multiple falls. EXAM: CHEST - 2 VIEW COMPARISON:  September 16, 2020 FINDINGS: Mild opacity in the left base region. The heart, hila, mediastinum, lungs, and pleura are otherwise unremarkable. IMPRESSION: Mild opacity in the left base is concerning for pneumonia given history. Recommend short-term follow-up imaging to ensure resolution. Electronically Signed   By: Dorise Bullion III M.D.   On: 08/29/2022 15:58   CT Cervical Spine Wo Contrast  Result Date: 08/29/2022 CLINICAL DATA:  Multiple falls, hit head, weakness EXAM: CT HEAD WITHOUT CONTRAST CT CERVICAL SPINE WITHOUT CONTRAST TECHNIQUE: Multidetector CT imaging of the head and cervical spine was performed following the standard protocol without intravenous contrast. Multiplanar CT image reconstructions of the cervical spine were also generated. RADIATION DOSE REDUCTION: This exam was performed according to the departmental dose-optimization program which includes automated exposure control, adjustment of the mA and/or kV according to patient size and/or use of iterative reconstruction technique. COMPARISON:  04/26/2022 FINDINGS: CT HEAD FINDINGS Brain: No acute infarct or hemorrhage. The lateral ventricles and midline structures are unremarkable. No acute extra-axial fluid collections. No mass effect. Vascular: No hyperdense vessel or unexpected calcification. Skull: Normal. Negative for fracture or focal lesion. Sinuses/Orbits: Mild mucosal thickening within the ethmoid and sphenoid sinuses. Small right mastoid effusion. Other: None. CT CERVICAL SPINE FINDINGS Alignment: Alignment is grossly anatomic. Slight reversal cervical lordosis is likely positional. Skull base and vertebrae: No acute fracture. No primary bone lesion or focal pathologic process. Soft tissues and spinal canal: No prevertebral fluid or swelling. No visible canal  hematoma. Disc levels: Mild multilevel spondylosis and facet hypertrophy. Facet hypertrophic changes are greatest at C2-3 and C4-5. Bony encroachment upon the right C4-5 neural foramen due to facet hypertrophic change. Upper chest: Airway is patent.  Lung apices are clear. Other: Reconstructed images demonstrate no additional findings. IMPRESSION: CT head: 1. No acute intracranial process. 2. Mild paranasal sinus disease. CT cervical spine: 1. No acute cervical spine fracture. 2. Mild multilevel degenerative changes, greatest at C4-5 with right neural foraminal encroachment due to facet hypertrophy. Electronically Signed   By: Randa Ngo M.D.   On: 08/29/2022 15:57   CT Head Wo Contrast  Result Date: 08/29/2022 CLINICAL DATA:  Multiple falls, hit head, weakness EXAM: CT HEAD WITHOUT CONTRAST CT CERVICAL SPINE WITHOUT CONTRAST TECHNIQUE: Multidetector CT imaging of the head and cervical spine was performed following the standard protocol without intravenous contrast. Multiplanar CT image reconstructions of the cervical spine were also generated. RADIATION DOSE REDUCTION: This exam was performed according to the departmental dose-optimization program which includes automated exposure control, adjustment of the mA and/or kV according to patient size and/or use of iterative reconstruction technique. COMPARISON:  04/26/2022 FINDINGS: CT HEAD FINDINGS Brain: No acute infarct  or hemorrhage. The lateral ventricles and midline structures are unremarkable. No acute extra-axial fluid collections. No mass effect. Vascular: No hyperdense vessel or unexpected calcification. Skull: Normal. Negative for fracture or focal lesion. Sinuses/Orbits: Mild mucosal thickening within the ethmoid and sphenoid sinuses. Small right mastoid effusion. Other: None. CT CERVICAL SPINE FINDINGS Alignment: Alignment is grossly anatomic. Slight reversal cervical lordosis is likely positional. Skull base and vertebrae: No acute fracture. No  primary bone lesion or focal pathologic process. Soft tissues and spinal canal: No prevertebral fluid or swelling. No visible canal hematoma. Disc levels: Mild multilevel spondylosis and facet hypertrophy. Facet hypertrophic changes are greatest at C2-3 and C4-5. Bony encroachment upon the right C4-5 neural foramen due to facet hypertrophic change. Upper chest: Airway is patent.  Lung apices are clear. Other: Reconstructed images demonstrate no additional findings. IMPRESSION: CT head: 1. No acute intracranial process. 2. Mild paranasal sinus disease. CT cervical spine: 1. No acute cervical spine fracture. 2. Mild multilevel degenerative changes, greatest at C4-5 with right neural foraminal encroachment due to facet hypertrophy. Electronically Signed   By: Randa Ngo M.D.   On: 08/29/2022 15:57    Microbiology: Results for orders placed or performed during the hospital encounter of 09/10/22  Resp panel by RT-PCR (RSV, Flu A&B, Covid) Anterior Nasal Swab     Status: None   Collection Time: 09/10/22  1:50 PM   Specimen: Anterior Nasal Swab  Result Value Ref Range Status   SARS Coronavirus 2 by RT PCR NEGATIVE NEGATIVE Final    Comment: (NOTE) SARS-CoV-2 target nucleic acids are NOT DETECTED.  The SARS-CoV-2 RNA is generally detectable in upper respiratory specimens during the acute phase of infection. The lowest concentration of SARS-CoV-2 viral copies this assay can detect is 138 copies/mL. A negative result does not preclude SARS-Cov-2 infection and should not be used as the sole basis for treatment or other patient management decisions. A negative result may occur with  improper specimen collection/handling, submission of specimen other than nasopharyngeal swab, presence of viral mutation(s) within the areas targeted by this assay, and inadequate number of viral copies(<138 copies/mL). A negative result must be combined with clinical observations, patient history, and  epidemiological information. The expected result is Negative.  Fact Sheet for Patients:  EntrepreneurPulse.com.au  Fact Sheet for Healthcare Providers:  IncredibleEmployment.be  This test is no t yet approved or cleared by the [place] FDA and  has been authorized for detection and/or diagnosis of SARS-CoV-2 by FDA under an Emergency Use Authorization (EUA). This EUA will remain  in effect (meaning this test can be used) for the duration of the COVID-19 declaration under Section 564(b)(1) of the Act, 21 U.S.C.section 360bbb-3(b)(1), unless the authorization is terminated  or revoked sooner.       Influenza A by PCR NEGATIVE NEGATIVE Final   Influenza B by PCR NEGATIVE NEGATIVE Final    Comment: (NOTE) The Xpert Xpress SARS-CoV-2/FLU/RSV plus assay is intended as an aid in the diagnosis of influenza from Nasopharyngeal swab specimens and should not be used as a sole basis for treatment. Nasal washings and aspirates are unacceptable for Xpert Xpress SARS-CoV-2/FLU/RSV testing.  Fact Sheet for Patients: EntrepreneurPulse.com.au  Fact Sheet for Healthcare Providers: IncredibleEmployment.be  This test is not yet approved or cleared by the [place] FDA and has been authorized for detection and/or diagnosis of SARS-CoV-2 by FDA under an Emergency Use Authorization (EUA). This EUA will remain in effect (meaning this test can be used) for the duration of the  COVID-19 declaration under Section 564(b)(1) of the Act, 21 U.S.C. section 360bbb-3(b)(1), unless the authorization is terminated or revoked.     Resp Syncytial Virus by PCR NEGATIVE NEGATIVE Final    Comment: (NOTE) Fact Sheet for Patients: EntrepreneurPulse.com.au  Fact Sheet for Healthcare Providers: IncredibleEmployment.be  This test is not yet approved or cleared by the [place] FDA and has been  authorized for detection and/or diagnosis of SARS-CoV-2 by FDA under an Emergency Use Authorization (EUA). This EUA will remain in effect (meaning this test can be used) for the duration of the COVID-19 declaration under Section 564(b)(1) of the Act, 21 U.S.C. section 360bbb-3(b)(1), unless the authorization is terminated or revoked.  Performed at [facility], [address] [place], [place] [address]   Blood Culture (routine x 2)     Status: None (Preliminary result)   Collection Time: 09/10/22  1:54 PM   Specimen: BLOOD RIGHT FOREARM  Result Value Ref Range Status   Specimen Description BLOOD RIGHT FOREARM  Final   Special Requests   Final    BOTTLES DRAWN AEROBIC AND ANAEROBIC Blood Culture adequate volume   Culture   Final    NO GROWTH 4 DAYS Performed at [facility], [address], [place], [place] [address]    Report Status PENDING  Incomplete  Blood Culture (routine x 2)     Status: Abnormal   Collection Time: 09/10/22  1:54 PM   Specimen: BLOOD LEFT HAND  Result Value Ref Range Status   Specimen Description   Final    BLOOD LEFT HAND Performed at [facility], [address] [place], [place] [address]    Special Requests   Final    BOTTLES DRAWN AEROBIC AND ANAEROBIC Blood Culture adequate volume Performed at [facility], [address], [place], [place] [address]    Culture  Setup Time   Final    GRAM NEGATIVE RODS ANAEROBIC BOTTLE ONLY CRITICAL RESULT CALLED TO, READ BACK BY AND VERIFIED WITH: JASON ROBBINS @ 0303 09/11/22 LFD Performed at [facility], [place] [address], [place], [place] [address]    Culture ESCHERICHIA COLI (A)  Final   Report Status 09/14/2022 FINAL  Final   Organism ID, Bacteria ESCHERICHIA COLI  Final      Susceptibility   Escherichia coli - MIC*    AMPICILLIN <=2 SENSITIVE Sensitive     CEFAZOLIN <=4 SENSITIVE Sensitive     CEFEPIME <=0.12 SENSITIVE Sensitive     CEFTAZIDIME <=1  SENSITIVE Sensitive     CEFTRIAXONE <=0.25 SENSITIVE Sensitive     CIPROFLOXACIN <=0.25 SENSITIVE Sensitive     GENTAMICIN <=1 SENSITIVE Sensitive     IMIPENEM <=0.25 SENSITIVE Sensitive     TRIMETH/SULFA <=20 SENSITIVE Sensitive     AMPICILLIN/SULBACTAM <=2 SENSITIVE Sensitive     PIP/TAZO <=4 SENSITIVE Sensitive     * ESCHERICHIA COLI  Blood Culture ID Panel (Reflexed)     Status: Abnormal (Preliminary result)   Collection Time: 09/10/22  1:54 PM  Result Value Ref Range Status   Enterococcus faecalis NOT DETECTED NOT DETECTED Final   Enterococcus Faecium NOT DETECTED NOT DETECTED Final   Listeria monocytogenes NOT DETECTED NOT DETECTED Final   Staphylococcus species NOT DETECTED NOT DETECTED Final   Staphylococcus aureus (BCID) PENDING NOT DETECTED Incomplete   Staphylococcus epidermidis NOT DETECTED NOT DETECTED Final   Staphylococcus lugdunensis NOT DETECTED NOT DETECTED Final   Streptococcus species NOT DETECTED NOT DETECTED Final   Streptococcus agalactiae  NOT DETECTED NOT DETECTED Final   Streptococcus pneumoniae NOT DETECTED NOT DETECTED Final   Streptococcus pyogenes NOT DETECTED NOT DETECTED Final   A.calcoaceticus-baumannii NOT DETECTED NOT DETECTED Final   Bacteroides fragilis NOT DETECTED NOT DETECTED Final   Enterobacterales DETECTED (A) NOT DETECTED Final    Comment: Enterobacterales represent a large order of gram negative bacteria, not a single organism. Refer to culture for further identification. CRITICAL RESULT CALLED TO, READ BACK BY AND VERIFIED WITH: JASON ROBBINS '@0303'$  09/11/22 LFD    Enterobacter cloacae complex NOT DETECTED NOT DETECTED Final   Escherichia coli NOT DETECTED NOT DETECTED Final   Klebsiella aerogenes NOT DETECTED NOT DETECTED Final   Klebsiella oxytoca NOT DETECTED NOT DETECTED Final   Klebsiella pneumoniae NOT DETECTED NOT DETECTED Final   Proteus species NOT DETECTED NOT DETECTED Final   Salmonella species NOT DETECTED NOT DETECTED Final    Serratia marcescens NOT DETECTED NOT DETECTED Final   Haemophilus influenzae NOT DETECTED NOT DETECTED Final   Neisseria meningitidis NOT DETECTED NOT DETECTED Final   Pseudomonas aeruginosa NOT DETECTED NOT DETECTED Final   Stenotrophomonas maltophilia NOT DETECTED NOT DETECTED Final   Candida albicans NOT DETECTED NOT DETECTED Final   Candida auris NOT DETECTED NOT DETECTED Final   Candida glabrata NOT DETECTED NOT DETECTED Final   Candida krusei NOT DETECTED NOT DETECTED Final   Candida parapsilosis NOT DETECTED NOT DETECTED Final   Candida tropicalis NOT DETECTED NOT DETECTED Final   Cryptococcus neoformans/gattii NOT DETECTED NOT DETECTED Final   CTX-M ESBL NOT DETECTED NOT DETECTED Final   Carbapenem resistance IMP NOT DETECTED NOT DETECTED Final   Carbapenem resistance KPC NOT DETECTED NOT DETECTED Final   Carbapenem resistance NDM NOT DETECTED NOT DETECTED Final   Carbapenem resist OXA 48 LIKE NOT DETECTED NOT DETECTED Final   Carbapenem resistance VIM NOT DETECTED NOT DETECTED Final    Comment: Performed at [facility], [address] [place], [place] [address]  Urine Culture     Status: Abnormal   Collection Time: 09/10/22  2:43 PM   Specimen: Urine, Clean Catch  Result Value Ref Range Status   Specimen Description   Final    URINE, CLEAN CATCH Performed at [facility], [address] [place], [place] [address]    Special Requests   Final    NONE Performed at [facility], [address] [place], [place] [address]    Culture 3,000 COLONIES/mL ESCHERICHIA COLI (A)  Final   Report Status 09/14/2022 FINAL  Final   Organism ID, Bacteria ESCHERICHIA COLI (A)  Final      Susceptibility   Escherichia coli - MIC*    AMPICILLIN <=2 SENSITIVE Sensitive     CEFAZOLIN <=4 SENSITIVE Sensitive     CEFEPIME <=0.12 SENSITIVE Sensitive     CEFTRIAXONE <=0.25 SENSITIVE Sensitive     CIPROFLOXACIN <=0.25 SENSITIVE Sensitive     GENTAMICIN <=1  SENSITIVE Sensitive     IMIPENEM <=0.25 SENSITIVE Sensitive     NITROFURANTOIN <=16 SENSITIVE Sensitive     TRIMETH/SULFA <=20 SENSITIVE Sensitive     AMPICILLIN/SULBACTAM <=2 SENSITIVE Sensitive     PIP/TAZO <=4 SENSITIVE Sensitive     * 3,000 COLONIES/mL ESCHERICHIA COLI    Labs: CBC: Recent Labs  Lab 09/10/22 1350 09/11/22 0805  WBC 9.5 7.2  NEUTROABS 7.9*  --   HGB 10.0* 10.0*  HCT 32.1* 32.0*  MCV 93.6 92.0  PLT 266 213   Basic Metabolic Panel: Recent Labs  Lab 09/10/22 1631 09/11/22 0805 09/12/22 0320  NA 137 141 139  K 4.8 4.2 4.1  CL 101 102 100  CO2 21* 24 29  GLUCOSE 238* 205* 173*  BUN 22* 26* 22*  CREATININE 1.47* 1.25* 1.01*  CALCIUM 9.1 9.4 9.4   Liver Function Tests: Recent Labs  Lab 09/10/22 1631 09/11/22 0805  AST 41 43*  ALT 30 32  ALKPHOS 49 45  BILITOT 2.0* 1.3*  PROT 6.4* 6.6  ALBUMIN 2.9* 2.8*   CBG: Recent Labs  Lab 09/13/22 0734 09/13/22 1113 09/13/22 1648 09/13/22 2149 09/14/22 0749  GLUCAP 203* 193* 165* 246* 239*    Discharge time spent: greater than 30 minutes. This record has been created using Systems analyst. Errors have been sought and corrected,but may not always be located. Such creation errors do not reflect on the standard of care.   Signed: Lorella Nimrod, MD [facility] 09/14/2022

## 2022-09-14 NOTE — Plan of Care (Signed)
  Problem: Education: Goal: Ability to describe self-care measures that may prevent or decrease complications (Diabetes Survival Skills Education) will improve Outcome: Progressing Goal: Individualized Educational Video(s) Outcome: Progressing   Problem: Coping: Goal: Ability to adjust to condition or change in health will improve Outcome: Progressing   Problem: Fluid Volume: Goal: Ability to maintain a balanced intake and output will improve Outcome: Progressing   Problem: Health Behavior/Discharge Planning: Goal: Ability to identify and utilize available resources and services will improve Outcome: Progressing Goal: Ability to manage health-related needs will improve Outcome: Progressing   Problem: Metabolic: Goal: Ability to maintain appropriate glucose levels will improve Outcome: Progressing   Problem: Nutritional: Goal: Maintenance of adequate nutrition will improve Outcome: Progressing Goal: Progress toward achieving an optimal weight will improve Outcome: Progressing   Problem: Skin Integrity: Goal: Risk for impaired skin integrity will decrease Outcome: Progressing   Problem: Tissue Perfusion: Goal: Adequacy of tissue perfusion will improve Outcome: Progressing   Problem: Fluid Volume: Goal: Hemodynamic stability will improve Outcome: Progressing   Problem: Clinical Measurements: Goal: Diagnostic test results will improve Outcome: Progressing Goal: Signs and symptoms of infection will decrease Outcome: Progressing   Problem: Respiratory: Goal: Ability to maintain adequate ventilation will improve Outcome: Progressing   Problem: Education: Goal: Knowledge of General Education information will improve Description: Including pain rating scale, medication(s)/side effects and non-pharmacologic comfort measures Outcome: Progressing   Problem: Health Behavior/Discharge Planning: Goal: Ability to manage health-related needs will improve Outcome:  Progressing   Problem: Clinical Measurements: Goal: Ability to maintain clinical measurements within normal limits will improve Outcome: Progressing Goal: Will remain free from infection Outcome: Progressing Goal: Diagnostic test results will improve Outcome: Progressing Goal: Respiratory complications will improve Outcome: Progressing Goal: Cardiovascular complication will be avoided Outcome: Progressing   Problem: Activity: Goal: Risk for activity intolerance will decrease Outcome: Progressing   Problem: Nutrition: Goal: Adequate nutrition will be maintained Outcome: Progressing   Problem: Coping: Goal: Level of anxiety will decrease Outcome: Progressing   Problem: Elimination: Goal: Will not experience complications related to bowel motility Outcome: Progressing Goal: Will not experience complications related to urinary retention Outcome: Progressing   Problem: Pain Managment: Goal: General experience of comfort will improve Outcome: Progressing   Problem: Safety: Goal: Ability to remain free from injury will improve Outcome: Progressing   Problem: Skin Integrity: Goal: Risk for impaired skin integrity will decrease Outcome: Progressing

## 2022-09-14 NOTE — Plan of Care (Signed)

## 2022-09-15 ENCOUNTER — Telehealth: Payer: Self-pay

## 2022-09-15 ENCOUNTER — Telehealth: Payer: Self-pay | Admitting: Family Medicine

## 2022-09-15 LAB — CULTURE, BLOOD (ROUTINE X 2)
Culture: NO GROWTH
Special Requests: ADEQUATE

## 2022-09-15 NOTE — Patient Outreach (Signed)
  Care Coordination [facility] Note Transition Care Management Unsuccessful Follow-up Telephone Call  Date of discharge and from where:  [facility] 09/10/22-09/14/22  Attempts:  1st Attempt  Reason for unsuccessful TCM follow-up call:  Left voice message  Johnney Killian, RN, BSN, CCM Care Management Coordinator [facility] Phone: [phone]: [phone]

## 2022-09-15 NOTE — Telephone Encounter (Signed)
Pt is requesting to delay home health PT and nursing orders from [facility] until Sunday 14th   [facility]  Best contact:: [phone] option 2

## 2022-09-17 DIAGNOSIS — I5032 Chronic diastolic (congestive) heart failure: Secondary | ICD-10-CM | POA: Diagnosis not present

## 2022-09-17 DIAGNOSIS — G4733 Obstructive sleep apnea (adult) (pediatric): Secondary | ICD-10-CM | POA: Diagnosis not present

## 2022-09-17 DIAGNOSIS — J3489 Other specified disorders of nose and nasal sinuses: Secondary | ICD-10-CM | POA: Diagnosis not present

## 2022-09-17 DIAGNOSIS — M4692 Unspecified inflammatory spondylopathy, cervical region: Secondary | ICD-10-CM | POA: Diagnosis not present

## 2022-09-17 DIAGNOSIS — B9620 Unspecified Escherichia coli [E. coli] as the cause of diseases classified elsewhere: Secondary | ICD-10-CM | POA: Diagnosis not present

## 2022-09-17 DIAGNOSIS — I5020 Unspecified systolic (congestive) heart failure: Secondary | ICD-10-CM | POA: Diagnosis not present

## 2022-09-17 DIAGNOSIS — E039 Hypothyroidism, unspecified: Secondary | ICD-10-CM | POA: Diagnosis not present

## 2022-09-17 DIAGNOSIS — D631 Anemia in chronic kidney disease: Secondary | ICD-10-CM | POA: Diagnosis not present

## 2022-09-17 DIAGNOSIS — K861 Other chronic pancreatitis: Secondary | ICD-10-CM | POA: Diagnosis not present

## 2022-09-17 DIAGNOSIS — E1165 Type 2 diabetes mellitus with hyperglycemia: Secondary | ICD-10-CM | POA: Diagnosis not present

## 2022-09-17 DIAGNOSIS — E1140 Type 2 diabetes mellitus with diabetic neuropathy, unspecified: Secondary | ICD-10-CM | POA: Diagnosis not present

## 2022-09-17 DIAGNOSIS — E1122 Type 2 diabetes mellitus with diabetic chronic kidney disease: Secondary | ICD-10-CM | POA: Diagnosis not present

## 2022-09-17 DIAGNOSIS — F250 Schizoaffective disorder, bipolar type: Secondary | ICD-10-CM | POA: Diagnosis not present

## 2022-09-17 DIAGNOSIS — M1990 Unspecified osteoarthritis, unspecified site: Secondary | ICD-10-CM | POA: Diagnosis not present

## 2022-09-17 DIAGNOSIS — E785 Hyperlipidemia, unspecified: Secondary | ICD-10-CM | POA: Diagnosis not present

## 2022-09-17 DIAGNOSIS — G9341 Metabolic encephalopathy: Secondary | ICD-10-CM | POA: Diagnosis not present

## 2022-09-17 DIAGNOSIS — N1831 Chronic kidney disease, stage 3a: Secondary | ICD-10-CM | POA: Diagnosis not present

## 2022-09-17 DIAGNOSIS — F419 Anxiety disorder, unspecified: Secondary | ICD-10-CM | POA: Diagnosis not present

## 2022-09-17 DIAGNOSIS — N10 Acute tubulo-interstitial nephritis: Secondary | ICD-10-CM | POA: Diagnosis not present

## 2022-09-17 DIAGNOSIS — K219 Gastro-esophageal reflux disease without esophagitis: Secondary | ICD-10-CM | POA: Diagnosis not present

## 2022-09-17 DIAGNOSIS — I110 Hypertensive heart disease with heart failure: Secondary | ICD-10-CM | POA: Diagnosis not present

## 2022-09-17 DIAGNOSIS — I2510 Atherosclerotic heart disease of native coronary artery without angina pectoris: Secondary | ICD-10-CM | POA: Diagnosis not present

## 2022-09-17 DIAGNOSIS — E1151 Type 2 diabetes mellitus with diabetic peripheral angiopathy without gangrene: Secondary | ICD-10-CM | POA: Diagnosis not present

## 2022-09-17 DIAGNOSIS — E440 Moderate protein-calorie malnutrition: Secondary | ICD-10-CM | POA: Diagnosis not present

## 2022-09-18 ENCOUNTER — Telehealth: Payer: Self-pay | Admitting: Family Medicine

## 2022-09-18 ENCOUNTER — Telehealth: Payer: Self-pay

## 2022-09-18 NOTE — Patient Outreach (Signed)
  Care Coordination [facility] Note Transition Care Management Unsuccessful Follow-up Telephone Call  Date of discharge and from where:  [facility] 09/14/22  Attempts:  2nd Attempt  Reason for unsuccessful TCM follow-up call:  Left voice message  Johnney Killian, RN, BSN, CCM Care Management Coordinator [facility] Phone: [phone]: [phone]

## 2022-09-18 NOTE — Telephone Encounter (Signed)
Confirmation given. (Began 09/17/2022)

## 2022-09-18 NOTE — Telephone Encounter (Signed)
Home Health Verbal Orders - Caller/Agency: [facility] Number: [phone] option #2 /  Requesting Skilled Nursing Frequency: 2x's a week for 1 week   and 1x a week for 8 weeks   The hospital suggest a CMP draw and  a CBC with [facility]

## 2022-09-19 ENCOUNTER — Telehealth: Payer: Self-pay

## 2022-09-19 DIAGNOSIS — N1831 Chronic kidney disease, stage 3a: Secondary | ICD-10-CM | POA: Diagnosis not present

## 2022-09-19 DIAGNOSIS — E1122 Type 2 diabetes mellitus with diabetic chronic kidney disease: Secondary | ICD-10-CM | POA: Diagnosis not present

## 2022-09-19 DIAGNOSIS — E1151 Type 2 diabetes mellitus with diabetic peripheral angiopathy without gangrene: Secondary | ICD-10-CM | POA: Diagnosis not present

## 2022-09-19 DIAGNOSIS — E440 Moderate protein-calorie malnutrition: Secondary | ICD-10-CM | POA: Diagnosis not present

## 2022-09-19 DIAGNOSIS — D631 Anemia in chronic kidney disease: Secondary | ICD-10-CM | POA: Diagnosis not present

## 2022-09-19 DIAGNOSIS — F250 Schizoaffective disorder, bipolar type: Secondary | ICD-10-CM | POA: Diagnosis not present

## 2022-09-19 DIAGNOSIS — M4692 Unspecified inflammatory spondylopathy, cervical region: Secondary | ICD-10-CM | POA: Diagnosis not present

## 2022-09-19 DIAGNOSIS — I5032 Chronic diastolic (congestive) heart failure: Secondary | ICD-10-CM | POA: Diagnosis not present

## 2022-09-19 DIAGNOSIS — I5020 Unspecified systolic (congestive) heart failure: Secondary | ICD-10-CM | POA: Diagnosis not present

## 2022-09-19 DIAGNOSIS — N10 Acute tubulo-interstitial nephritis: Secondary | ICD-10-CM | POA: Diagnosis not present

## 2022-09-19 DIAGNOSIS — E1165 Type 2 diabetes mellitus with hyperglycemia: Secondary | ICD-10-CM | POA: Diagnosis not present

## 2022-09-19 DIAGNOSIS — E1140 Type 2 diabetes mellitus with diabetic neuropathy, unspecified: Secondary | ICD-10-CM | POA: Diagnosis not present

## 2022-09-19 DIAGNOSIS — I110 Hypertensive heart disease with heart failure: Secondary | ICD-10-CM | POA: Diagnosis not present

## 2022-09-19 DIAGNOSIS — B9620 Unspecified Escherichia coli [E. coli] as the cause of diseases classified elsewhere: Secondary | ICD-10-CM | POA: Diagnosis not present

## 2022-09-19 DIAGNOSIS — K861 Other chronic pancreatitis: Secondary | ICD-10-CM | POA: Diagnosis not present

## 2022-09-19 NOTE — Patient Outreach (Signed)
  Care Coordination [facility] Note Transition Care Management Follow-up Telephone Call Date of discharge and from where: [facility] 09/14/22 How have you been since you were released from the hospital? "I am feeling better, home health PT is coming out this afternoon. Any questions or concerns? No  Items Reviewed: Did the pt receive and understand the discharge instructions provided? Yes  Medications obtained and verified? Yes  Other? No  Any new allergies since your discharge? No  Dietary orders reviewed? Yes Do you have support at home? Yes   Home Care and Equipment/Supplies: Were home health services ordered? yes If so, what is the name of the agency? [facility]  Has the agency set up a time to come to the patient's home? yes Were any new equipment or medical supplies ordered?  No What is the name of the medical supply agency? N/a Were you able to get the supplies/equipment? not applicable Do you have any questions related to the use of the equipment or supplies? No  Functional Questionnaire: (I = Independent and D = Dependent) ADLs: I  Bathing/Dressing- I  Meal Prep- I  Eating- I  Maintaining continence- I  Transferring/Ambulation- I  Managing Meds- I  Follow up appointments reviewed:  PCP Hospital f/u appt confirmed? Yes  Scheduled to see Dr. Ancil Boozer on 09/27/22 @ 10:40. [facility] f/u appt confirmed? No   Are transportation arrangements needed? No  If their condition worsens, is the pt aware to call PCP or go to the Emergency Dept.? Yes Was the patient provided with contact information for the PCP's office or ED? Yes Was to pt encouraged to call back with questions or concerns? Yes  SDOH assessments and interventions completed:   Yes SDOH Interventions Today    Flowsheet Row Most Recent Value  SDOH Interventions   Housing Interventions Intervention Not Indicated  Utilities Interventions Intervention Not Indicated       Care Coordination Interventions:  No  Care Coordination interventions needed at this time.   Encounter Outcome:  Pt. Visit Completed

## 2022-09-20 DIAGNOSIS — F250 Schizoaffective disorder, bipolar type: Secondary | ICD-10-CM | POA: Diagnosis not present

## 2022-09-20 DIAGNOSIS — N10 Acute tubulo-interstitial nephritis: Secondary | ICD-10-CM | POA: Diagnosis not present

## 2022-09-20 DIAGNOSIS — E1140 Type 2 diabetes mellitus with diabetic neuropathy, unspecified: Secondary | ICD-10-CM | POA: Diagnosis not present

## 2022-09-20 DIAGNOSIS — I5020 Unspecified systolic (congestive) heart failure: Secondary | ICD-10-CM | POA: Diagnosis not present

## 2022-09-20 DIAGNOSIS — E440 Moderate protein-calorie malnutrition: Secondary | ICD-10-CM | POA: Diagnosis not present

## 2022-09-20 DIAGNOSIS — E1151 Type 2 diabetes mellitus with diabetic peripheral angiopathy without gangrene: Secondary | ICD-10-CM | POA: Diagnosis not present

## 2022-09-20 DIAGNOSIS — I110 Hypertensive heart disease with heart failure: Secondary | ICD-10-CM | POA: Diagnosis not present

## 2022-09-20 DIAGNOSIS — N1831 Chronic kidney disease, stage 3a: Secondary | ICD-10-CM | POA: Diagnosis not present

## 2022-09-20 DIAGNOSIS — D631 Anemia in chronic kidney disease: Secondary | ICD-10-CM | POA: Diagnosis not present

## 2022-09-20 DIAGNOSIS — M4692 Unspecified inflammatory spondylopathy, cervical region: Secondary | ICD-10-CM | POA: Diagnosis not present

## 2022-09-20 DIAGNOSIS — K861 Other chronic pancreatitis: Secondary | ICD-10-CM | POA: Diagnosis not present

## 2022-09-20 DIAGNOSIS — B9620 Unspecified Escherichia coli [E. coli] as the cause of diseases classified elsewhere: Secondary | ICD-10-CM | POA: Diagnosis not present

## 2022-09-20 DIAGNOSIS — E1165 Type 2 diabetes mellitus with hyperglycemia: Secondary | ICD-10-CM | POA: Diagnosis not present

## 2022-09-20 DIAGNOSIS — I5032 Chronic diastolic (congestive) heart failure: Secondary | ICD-10-CM | POA: Diagnosis not present

## 2022-09-20 DIAGNOSIS — E1122 Type 2 diabetes mellitus with diabetic chronic kidney disease: Secondary | ICD-10-CM | POA: Diagnosis not present

## 2022-09-22 DIAGNOSIS — E1165 Type 2 diabetes mellitus with hyperglycemia: Secondary | ICD-10-CM | POA: Diagnosis not present

## 2022-09-22 DIAGNOSIS — F250 Schizoaffective disorder, bipolar type: Secondary | ICD-10-CM | POA: Diagnosis not present

## 2022-09-22 DIAGNOSIS — I5020 Unspecified systolic (congestive) heart failure: Secondary | ICD-10-CM | POA: Diagnosis not present

## 2022-09-22 DIAGNOSIS — B9620 Unspecified Escherichia coli [E. coli] as the cause of diseases classified elsewhere: Secondary | ICD-10-CM | POA: Diagnosis not present

## 2022-09-22 DIAGNOSIS — M4692 Unspecified inflammatory spondylopathy, cervical region: Secondary | ICD-10-CM | POA: Diagnosis not present

## 2022-09-22 DIAGNOSIS — K861 Other chronic pancreatitis: Secondary | ICD-10-CM | POA: Diagnosis not present

## 2022-09-22 DIAGNOSIS — I110 Hypertensive heart disease with heart failure: Secondary | ICD-10-CM | POA: Diagnosis not present

## 2022-09-22 DIAGNOSIS — E1122 Type 2 diabetes mellitus with diabetic chronic kidney disease: Secondary | ICD-10-CM | POA: Diagnosis not present

## 2022-09-22 DIAGNOSIS — E440 Moderate protein-calorie malnutrition: Secondary | ICD-10-CM | POA: Diagnosis not present

## 2022-09-22 DIAGNOSIS — N1831 Chronic kidney disease, stage 3a: Secondary | ICD-10-CM | POA: Diagnosis not present

## 2022-09-22 DIAGNOSIS — E1151 Type 2 diabetes mellitus with diabetic peripheral angiopathy without gangrene: Secondary | ICD-10-CM | POA: Diagnosis not present

## 2022-09-22 DIAGNOSIS — N10 Acute tubulo-interstitial nephritis: Secondary | ICD-10-CM | POA: Diagnosis not present

## 2022-09-22 DIAGNOSIS — D631 Anemia in chronic kidney disease: Secondary | ICD-10-CM | POA: Diagnosis not present

## 2022-09-22 DIAGNOSIS — E1140 Type 2 diabetes mellitus with diabetic neuropathy, unspecified: Secondary | ICD-10-CM | POA: Diagnosis not present

## 2022-09-22 DIAGNOSIS — I5032 Chronic diastolic (congestive) heart failure: Secondary | ICD-10-CM | POA: Diagnosis not present

## 2022-09-25 ENCOUNTER — Telehealth: Payer: Self-pay | Admitting: Family Medicine

## 2022-09-25 NOTE — Telephone Encounter (Signed)
Home Health Verbal Orders - Caller/Agency: [facility] Number: [phone]/ vm can be left  Requesting PT  Frequency: continuation  2x's a week for 4 weeks and  1x a week for 4 weeks

## 2022-09-26 DIAGNOSIS — E1151 Type 2 diabetes mellitus with diabetic peripheral angiopathy without gangrene: Secondary | ICD-10-CM | POA: Diagnosis not present

## 2022-09-26 DIAGNOSIS — I5020 Unspecified systolic (congestive) heart failure: Secondary | ICD-10-CM | POA: Diagnosis not present

## 2022-09-26 DIAGNOSIS — B9620 Unspecified Escherichia coli [E. coli] as the cause of diseases classified elsewhere: Secondary | ICD-10-CM | POA: Diagnosis not present

## 2022-09-26 DIAGNOSIS — D631 Anemia in chronic kidney disease: Secondary | ICD-10-CM | POA: Diagnosis not present

## 2022-09-26 DIAGNOSIS — M4692 Unspecified inflammatory spondylopathy, cervical region: Secondary | ICD-10-CM | POA: Diagnosis not present

## 2022-09-26 DIAGNOSIS — E1122 Type 2 diabetes mellitus with diabetic chronic kidney disease: Secondary | ICD-10-CM | POA: Diagnosis not present

## 2022-09-26 DIAGNOSIS — E440 Moderate protein-calorie malnutrition: Secondary | ICD-10-CM | POA: Diagnosis not present

## 2022-09-26 DIAGNOSIS — I110 Hypertensive heart disease with heart failure: Secondary | ICD-10-CM | POA: Diagnosis not present

## 2022-09-26 DIAGNOSIS — K861 Other chronic pancreatitis: Secondary | ICD-10-CM | POA: Diagnosis not present

## 2022-09-26 DIAGNOSIS — F250 Schizoaffective disorder, bipolar type: Secondary | ICD-10-CM | POA: Diagnosis not present

## 2022-09-26 DIAGNOSIS — I5032 Chronic diastolic (congestive) heart failure: Secondary | ICD-10-CM | POA: Diagnosis not present

## 2022-09-26 DIAGNOSIS — E1165 Type 2 diabetes mellitus with hyperglycemia: Secondary | ICD-10-CM | POA: Diagnosis not present

## 2022-09-26 DIAGNOSIS — E1140 Type 2 diabetes mellitus with diabetic neuropathy, unspecified: Secondary | ICD-10-CM | POA: Diagnosis not present

## 2022-09-26 DIAGNOSIS — N10 Acute tubulo-interstitial nephritis: Secondary | ICD-10-CM | POA: Diagnosis not present

## 2022-09-26 DIAGNOSIS — N1831 Chronic kidney disease, stage 3a: Secondary | ICD-10-CM | POA: Diagnosis not present

## 2022-09-26 NOTE — Progress Notes (Unsigned)
Name: [name]   MRN: [id number]    DOB: 1965-09-10   Date:09/27/2022       Progress Note  [facility] Follow-Up  HPI  Admitted: 09/10/22 Discharged: 09/14/22  She developed confusion and falls at home the week leading to hospital visit. She was found to be septic, had acute on chronic renal failure , she had hydronephrosis but no stones seen, she was given IV antibiotics and sent home on Levaquin, no change on other medications. She has been feeling week since discharge, she has been having PT twice a week  and using a walker since discharge and also home health nurse once a week.   No fever, dysuria, appetite is normal, glucose at home has been elevated - monitored by Dr. Ronnald Collum  Medication reconciliation was done  Patient Active Problem List   Diagnosis Date Noted   Bacteremia due to Escherichia coli 09/13/2022   Encephalopathy acute 09/13/2022   Severe sepsis ([place]) 09/10/2022   Vertigo 02/10/2022   Abnormal urinalysis 02/10/2022   TIA (transient ischemic attack) 10/04/2021   HTN (hypertension) 10/04/2021   Chronic kidney disease, stage 3a ([place]) 10/04/2021   Depression with anxiety 10/04/2021   COPD (chronic obstructive pulmonary disease) ([place]) 10/04/2021   CAD (coronary artery disease) 10/04/2021   Chronic diastolic CHF (congestive heart failure) ([place]) 10/04/2021   Type II diabetes mellitus with renal manifestations ([place]) 06/30/2021   Interstitial cystitis 06/30/2021   Atherosclerosis of aorta ([place]) 03/28/2021   Iron deficiency anemia due to chronic blood loss    Polyp of ascending colon    Vitamin B12 deficiency 08/08/2018   Patient is full code 10/30/2017   Coronary artery disease of native artery of native heart with stable angina pectoris ([place]) 10/09/2017   Plantar fascial fibromatosis 10/09/2017   Tendinitis of wrist 10/09/2017   Schizoaffective disorder, bipolar type ([place]) 09/28/2017   Adenomatous colon polyp 10/15/2015   Bipolar  affective disorder ([place]) 10/15/2015   CN (constipation) 10/15/2015   HLD (hyperlipidemia) 10/15/2015   NASH (nonalcoholic steatohepatitis) 10/15/2015   Obesity (BMI 35.0-39.9 without comorbidity) 10/15/2015   Restless leg syndrome 10/15/2015   Low back pain with left-sided sciatica 08/18/2015   Apnea, sleep 05/05/2015   Heart valve disease 29/51/8841   Complicated UTI (urinary tract infection) 03/24/2015   CKD stage 3 due to type 2 diabetes mellitus ([place]) 03/15/2015   Migraine 03/12/2015   Chronic bronchitis ([place]) 01/20/2015   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[place]) 01/20/2015   GERD (gastroesophageal reflux disease) 01/20/2015   Hypothyroid    Vitamin D deficiency    Urinary incontinence    Liver lesion    Hypomagnesemia    Obstructive sleep apnea 10/20/2013   Allergic rhinitis 07/04/2007    Past Surgical History:  Procedure Laterality Date   ABDOMINAL HYSTERECTOMY     Partial   bladder stim  2007   BLADDER SURGERY     x 2. tacking of bladder and the other was the stimulator placement   CHOLECYSTECTOMY     COLONOSCOPY WITH PROPOFOL N/A 03/04/2019   Procedure: COLONOSCOPY WITH PROPOFOL;  Surgeon: [name], MD;  Location: [facility] ENDOSCOPY;  Service: Endoscopy;  Laterality: N/A;   ESOPHAGOGASTRODUODENOSCOPY (EGD) WITH PROPOFOL N/A 03/04/2019   Procedure: ESOPHAGOGASTRODUODENOSCOPY (EGD) WITH PROPOFOL;  Surgeon: [name], MD;  Location: [facility] ENDOSCOPY;  Service: Endoscopy;  Laterality: N/A;   INCISION AND DRAINAGE ABSCESS Right 04/16/2019   Procedure: INCISION AND DRAINAGE ABSCESS;  Surgeon: Benjamine Sprague, DO;  Location: [facility]  ORS;  Service: General;  Laterality: Right;   INTERSTIM-GENERATOR CHANGE N/A 12/31/2017   Procedure: INTERSTIM-GENERATOR CHANGE;  Surgeon: Bjorn Loser, MD;  Location: [facility] ORS;  Service: Urology;  Laterality: N/A;   LIVER BIOPSY  March 2016   Negative   LUNG BIOPSY  2015   small spot on lung   TUBAL LIGATION     UPPER GI ENDOSCOPY     WRIST SURGERY  Right 11/02/2019    Family History  Problem Relation Age of Onset   Diabetes Mother    Heart disease Mother    Hyperlipidemia Mother    Hypertension Mother    Kidney disease Mother    Mental illness Mother    Hypothyroidism Mother    Stroke Mother    Osteoporosis Mother    Glaucoma Mother    Congestive Heart Failure Mother    Hypertension Father    Kidney disease Brother    Intracerebral hemorrhage Brother    Breast cancer Maternal Grandmother 73   Heart disease Maternal Grandfather    Rheum arthritis Maternal Grandfather    Cancer Maternal Grandfather        liver   Kidney disease Maternal Grandfather    Cancer Paternal Grandmother        lung   Asthma Daughter    Cancer Daughter        throat   Bipolar disorder Daughter    Bipolar disorder Daughter    Hypertension Daughter    Restless legs syndrome Daughter     Social History   Tobacco Use   Smoking status: Never   Smokeless tobacco: Never  Substance Use Topics   Alcohol use: No     Current Outpatient Medications:    atorvastatin (LIPITOR) 80 MG tablet, Take 80 mg by mouth at bedtime. , Disp: , Rfl:    busPIRone (BUSPAR) 30 MG tablet, Take 30 mg by mouth 2 (two) times daily., Disp: , Rfl:    citalopram (CELEXA) 20 MG tablet, Take 20 mg by mouth daily., Disp: , Rfl:    clopidogrel (PLAVIX) 75 MG tablet, Take 1 tablet (75 mg total) by mouth daily., Disp: 30 tablet, Rfl: 11   esomeprazole (NEXIUM) 40 MG capsule, Take 40 mg by mouth daily., Disp: , Rfl:    ezetimibe (ZETIA) 10 MG tablet, Take 10 mg by mouth at bedtime., Disp: , Rfl:    fenofibrate micronized (LOFIBRA) 134 MG capsule, Take 134 mg by mouth at bedtime., Disp: , Rfl:    fluticasone (FLONASE) 50 MCG/ACT nasal spray, PLACE 2 SPRAYS INTO BOTH NOSTRILS DAILY, Disp: 16 g, Rfl: 2   gabapentin (NEURONTIN) 300 MG capsule, Take 300 mg by mouth 2 (two) times daily., Disp: , Rfl:    gabapentin (NEURONTIN) 600 MG tablet, Take 600 mg by mouth at bedtime., Disp: ,  Rfl:    GLYXAMBI 25-5 MG TABS, Take 1 tablet by mouth daily., Disp: , Rfl:    HUMULIN R U-500 KWIKPEN 500 UNIT/ML kwikpen, Inject 70 Units into the skin 3 (three) times daily with meals., Disp: , Rfl:    hydrOXYzine (ATARAX) 50 MG tablet, Take 50 mg by mouth 3 (three) times daily., Disp: , Rfl:    Insulin Aspart FlexPen (NOVOLOG) 100 UNIT/ML, Inject into the skin., Disp: , Rfl:    Insulin Pen Needle (PEN NEEDLES) 31G X 8 MM MISC, , Disp: , Rfl:    lamoTRIgine (LAMICTAL) 200 MG tablet, Take 200 mg by mouth 2 (two) times daily. , Disp: , Rfl:  linaclotide (LINZESS) 145 MCG CAPS capsule, TAKE 1 CAPSULE BY MOUTH DAILY BEFORE BREAKFAST (Patient taking differently: Take 145 mcg by mouth daily before breakfast.), Disp: 30 capsule, Rfl: 3   meclizine (ANTIVERT) 25 MG tablet, TAKE 1 TABLET BY MOUTH 3 TIMES DAILY AS NEEDED FOR DIZZINESS OR NAUSEA, Disp: 10 tablet, Rfl: 0   metoprolol tartrate (LOPRESSOR) 25 MG tablet, Take 25 mg by mouth 2 (two) times daily., Disp: , Rfl:    nitroGLYCERIN (NITROSTAT) 0.4 MG SL tablet, Place 1 tablet (0.4 mg total) under the tongue every 5 (five) minutes as needed for chest pain., Disp: 25 tablet, Rfl: 0   ondansetron (ZOFRAN-ODT) 4 MG disintegrating tablet, Take 1 tablet (4 mg total) by mouth every 8 (eight) hours as needed for nausea or vomiting., Disp: 8 tablet, Rfl: 0   OneTouch Delica Lancets 71G MISC, by Does not apply route., Disp: , Rfl:    promethazine (PHENERGAN) 25 MG suppository, Place 1 suppository (25 mg total) rectally every 6 (six) hours as needed for nausea or vomiting., Disp: 12 each, Rfl: 0   QUEtiapine (SEROQUEL) 300 MG tablet, Take 300 mg by mouth at bedtime. Along with '200mg'$  dose for a total of '500mg'$ , Disp: , Rfl:    SUMAtriptan (IMITREX) 100 MG tablet, May repeat in 2 hours if headache persists or recurs., Disp: 10 tablet, Rfl: 0   SYNTHROID 100 MCG tablet, Take 100 mcg by mouth daily., Disp: , Rfl:    TRULICITY 1.5 GY/6.9SW SOPN, Inject 1.5 mg into  the skin once a week. (Wednesdays), Disp: , Rfl:    Ubrogepant (UBRELVY) 100 MG TABS, Take 1 tablet by mouth every other day., Disp: 16 tablet, Rfl: 5   HUMALOG KWIKPEN 100 UNIT/ML KwikPen, Inject into the skin. (Patient not taking: Reported on 09/27/2022), Disp: , Rfl:   Allergies  Allergen Reactions   Mirtazapine Anaphylaxis    REACTION: closes throat   Morphine And Related     anaphylaxis   Sulfamethoxazole-Trimethoprim Rash    REACTION: Whelps all over   Amoxicillin Er Nausea And Vomiting    Pt doesn't remember problem with amoxicillin -  SE Has patient had a PCN reaction causing immediate rash, facial/tongue/throat swelling, SOB or lightheadedness with hypotension: No Has patient had a PCN reaction causing severe rash involving mucus membranes or skin necrosis: No Has patient had a PCN reaction that required hospitalization: No Has patient had a PCN reaction occurring within the last 10 years: Yes If all of the above answers are "NO", then may proceed with Cephalosporin use.   Codeine Itching    REACTION: itch, rash   Hydrocodone-Acetaminophen Rash    REACTION: rash   Keflex [Cephalexin] Rash    Pt does not remember having a reaction or rash, no airway involvement   Prasterone Nausea And Vomiting    I personally reviewed active problem list, medication list, allergies, family history, social history, health maintenance with the patient/caregiver today.   ROS  Ten systems reviewed and is negative except as mentioned in HPI   Objective  Vitals:   09/27/22 1022  BP: 128/74  Pulse: 82  Resp: 18  Temp: 97.8 F (36.6 C)  TempSrc: Oral  SpO2: 95%  Weight: 188 lb 9.6 oz (85.5 kg)  Height: '4\' 11"'$  (1.499 m)    Body mass index is 38.09 kg/m.  Physical Exam  Constitutional: Patient appears well-developed and well-nourished. Obese  No distress.  HEENT: head atraumatic, normocephalic, pupils equal and reactive to light, neck supple Cardiovascular: Normal  rate, regular  rhythm and normal heart sounds.  No murmur heard. No BLE edema. Pulmonary/Chest: Effort normal and breath sounds normal. No respiratory distress. Abdominal: Soft.  There is no tenderness. Negative CVA tenderness , no masses  Muscular skeletal: using a walker  Psychiatric: Patient has a normal mood and affect. behavior is normal. Judgment and thought content normal.    PHQ2/9:    09/27/2022   10:25 AM 04/27/2022   10:42 AM 02/21/2022   12:51 PM 02/02/2022    2:14 PM 12/28/2021   12:01 PM  Depression screen PHQ 2/9  Decreased Interest 0 '2 1 1 1  '$ Down, Depressed, Hopeless 0 '1 2 1 1  '$ PHQ - 2 Score 0 '3 3 2 2  '$ Altered sleeping 0 0 0 1 1  Tired, decreased energy 0 0 '1 3 3  '$ Change in appetite 0 1 1 0 1  Feeling bad or failure about yourself  0 0 0 0 0  Trouble concentrating 0 0 0 1 1  Moving slowly or fidgety/restless 0 0 0 1 1  Suicidal thoughts 0 0 0 0 0  PHQ-9 Score 0 '4 5 8 9  '$ Difficult doing work/chores   Somewhat difficult Somewhat difficult Somewhat difficult    phq 9 is negative   Fall Risk:    09/27/2022   10:25 AM 04/27/2022   10:42 AM 02/21/2022   12:50 PM 02/02/2022    2:19 PM 12/28/2021   11:41 AM  Fall Risk   Falls in the past year? '1 1 1 1 1  '$ Number falls in past yr: '1 1 1 1 1  '$ Injury with Fall? 0 0 0 0 0  Risk for fall due to : History of fall(s);Impaired balance/gait Impaired balance/gait History of fall(s);Impaired balance/gait History of fall(s)   Follow up Falls prevention discussed;Education provided;Falls evaluation completed Falls prevention discussed Falls evaluation completed Falls prevention discussed Falls prevention discussed;Education provided      Functional Status Survey: Is the patient deaf or have difficulty hearing?: No Does the patient have difficulty seeing, even when wearing glasses/contacts?: No Does the patient have difficulty concentrating, remembering, or making decisions?: No Does the patient have difficulty walking or climbing stairs?:  Yes Does the patient have difficulty dressing or bathing?: No Does the patient have difficulty doing errands alone such as visiting a doctor's office or shopping?: Yes    Assessment & Plan   1. Hospital discharge follow-up  - COMPLETE METABOLIC PANEL WITH GFR - CBC with Differential/Platelet  2. History of sepsis  Feeling better, still weak and having home PT and using walker, no fever  3. Splenomegaly  - US Renal; Future  4. Other hydronephrosis  - US SPLEEN (ABDOMEN LIMITED); Future

## 2022-09-26 NOTE — Telephone Encounter (Signed)
Called number x2. Rings for a little then states number is not in service, unable to give verbal.

## 2022-09-27 ENCOUNTER — Encounter: Payer: Self-pay | Admitting: Family Medicine

## 2022-09-27 ENCOUNTER — Ambulatory Visit (INDEPENDENT_AMBULATORY_CARE_PROVIDER_SITE_OTHER): Payer: Medicare Other | Admitting: Family Medicine

## 2022-09-27 ENCOUNTER — Other Ambulatory Visit: Payer: Self-pay

## 2022-09-27 VITALS — BP 128/74 | HR 82 | Temp 97.8°F | Resp 18 | Ht 59.0 in | Wt 188.6 lb

## 2022-09-27 DIAGNOSIS — R161 Splenomegaly, not elsewhere classified: Secondary | ICD-10-CM | POA: Diagnosis not present

## 2022-09-27 DIAGNOSIS — Z8619 Personal history of other infectious and parasitic diseases: Secondary | ICD-10-CM | POA: Diagnosis not present

## 2022-09-27 DIAGNOSIS — N1339 Other hydronephrosis: Secondary | ICD-10-CM | POA: Diagnosis not present

## 2022-09-27 DIAGNOSIS — Z09 Encounter for follow-up examination after completed treatment for conditions other than malignant neoplasm: Secondary | ICD-10-CM | POA: Diagnosis not present

## 2022-09-27 DIAGNOSIS — E1169 Type 2 diabetes mellitus with other specified complication: Secondary | ICD-10-CM

## 2022-09-27 DIAGNOSIS — G43009 Migraine without aura, not intractable, without status migrainosus: Secondary | ICD-10-CM

## 2022-09-27 MED ORDER — MECLIZINE HCL 25 MG PO TABS: ORAL_TABLET | ORAL | 0 refills | Status: DC

## 2022-09-27 MED ORDER — SUMATRIPTAN SUCCINATE 100 MG PO TABS: ORAL_TABLET | ORAL | 0 refills | Status: DC

## 2022-09-28 DIAGNOSIS — I5020 Unspecified systolic (congestive) heart failure: Secondary | ICD-10-CM | POA: Diagnosis not present

## 2022-09-28 DIAGNOSIS — E1140 Type 2 diabetes mellitus with diabetic neuropathy, unspecified: Secondary | ICD-10-CM | POA: Diagnosis not present

## 2022-09-28 DIAGNOSIS — F250 Schizoaffective disorder, bipolar type: Secondary | ICD-10-CM | POA: Diagnosis not present

## 2022-09-28 DIAGNOSIS — E1122 Type 2 diabetes mellitus with diabetic chronic kidney disease: Secondary | ICD-10-CM | POA: Diagnosis not present

## 2022-09-28 DIAGNOSIS — N10 Acute tubulo-interstitial nephritis: Secondary | ICD-10-CM | POA: Diagnosis not present

## 2022-09-28 DIAGNOSIS — N1831 Chronic kidney disease, stage 3a: Secondary | ICD-10-CM | POA: Diagnosis not present

## 2022-09-28 DIAGNOSIS — B9620 Unspecified Escherichia coli [E. coli] as the cause of diseases classified elsewhere: Secondary | ICD-10-CM | POA: Diagnosis not present

## 2022-09-28 DIAGNOSIS — I110 Hypertensive heart disease with heart failure: Secondary | ICD-10-CM | POA: Diagnosis not present

## 2022-09-28 DIAGNOSIS — I5032 Chronic diastolic (congestive) heart failure: Secondary | ICD-10-CM | POA: Diagnosis not present

## 2022-09-28 DIAGNOSIS — M4692 Unspecified inflammatory spondylopathy, cervical region: Secondary | ICD-10-CM | POA: Diagnosis not present

## 2022-09-28 DIAGNOSIS — E1151 Type 2 diabetes mellitus with diabetic peripheral angiopathy without gangrene: Secondary | ICD-10-CM | POA: Diagnosis not present

## 2022-09-28 DIAGNOSIS — E1165 Type 2 diabetes mellitus with hyperglycemia: Secondary | ICD-10-CM | POA: Diagnosis not present

## 2022-09-28 DIAGNOSIS — K861 Other chronic pancreatitis: Secondary | ICD-10-CM | POA: Diagnosis not present

## 2022-09-28 DIAGNOSIS — E440 Moderate protein-calorie malnutrition: Secondary | ICD-10-CM | POA: Diagnosis not present

## 2022-09-28 DIAGNOSIS — D631 Anemia in chronic kidney disease: Secondary | ICD-10-CM | POA: Diagnosis not present

## 2022-09-28 LAB — CBC WITH DIFFERENTIAL/PLATELET
Absolute Monocytes: 458 cells/uL (ref 200–950)
Basophils Absolute: 52 cells/uL (ref 0–200)
Basophils Relative: 0.9 %
Eosinophils Absolute: 157 cells/uL (ref 15–500)
Eosinophils Relative: 2.7 %
HCT: 35.4 % (ref 35.0–45.0)
Hemoglobin: 11.5 g/dL — ABNORMAL LOW (ref 11.7–15.5)
Lymphs Abs: 1891 cells/uL (ref 850–3900)
MCH: 29.9 pg (ref 27.0–33.0)
MCHC: 32.5 g/dL (ref 32.0–36.0)
MCV: 92.2 fL (ref 80.0–100.0)
MPV: 10 fL (ref 7.5–12.5)
Monocytes Relative: 7.9 %
Neutro Abs: 3242 cells/uL (ref 1500–7800)
Neutrophils Relative %: 55.9 %
Platelets: 273 10*3/uL (ref 140–400)
RBC: 3.84 10*6/uL (ref 3.80–5.10)
RDW: 13 % (ref 11.0–15.0)
Total Lymphocyte: 32.6 %
WBC: 5.8 10*3/uL (ref 3.8–10.8)

## 2022-09-28 LAB — COMPLETE METABOLIC PANEL WITH GFR
AG Ratio: 1.6 (calc) (ref 1.0–2.5)
ALT: 43 U/L — ABNORMAL HIGH (ref 6–29)
AST: 51 U/L — ABNORMAL HIGH (ref 10–35)
Albumin: 3.9 g/dL (ref 3.6–5.1)
Alkaline phosphatase (APISO): 48 U/L (ref 37–153)
BUN/Creatinine Ratio: 30 (calc) — ABNORMAL HIGH (ref 6–22)
BUN: 36 mg/dL — ABNORMAL HIGH (ref 7–25)
CO2: 28 mmol/L (ref 20–32)
Calcium: 10.4 mg/dL (ref 8.6–10.4)
Chloride: 104 mmol/L (ref 98–110)
Creat: 1.2 mg/dL — ABNORMAL HIGH (ref 0.50–1.03)
Globulin: 2.4 g/dL (calc) (ref 1.9–3.7)
Glucose, Bld: 104 mg/dL — ABNORMAL HIGH (ref 65–99)
Potassium: 4.9 mmol/L (ref 3.5–5.3)
Sodium: 144 mmol/L (ref 135–146)
Total Bilirubin: 0.4 mg/dL (ref 0.2–1.2)
Total Protein: 6.3 g/dL (ref 6.1–8.1)
eGFR: 52 mL/min/{1.73_m2} — ABNORMAL LOW (ref >=60)

## 2022-10-03 ENCOUNTER — Encounter: Payer: Self-pay | Admitting: Family Medicine

## 2022-10-03 ENCOUNTER — Telehealth (INDEPENDENT_AMBULATORY_CARE_PROVIDER_SITE_OTHER): Payer: Medicare Other | Admitting: Family Medicine

## 2022-10-03 ENCOUNTER — Ambulatory Visit: Payer: Self-pay | Admitting: *Deleted

## 2022-10-03 DIAGNOSIS — K861 Other chronic pancreatitis: Secondary | ICD-10-CM | POA: Diagnosis not present

## 2022-10-03 DIAGNOSIS — E440 Moderate protein-calorie malnutrition: Secondary | ICD-10-CM | POA: Diagnosis not present

## 2022-10-03 DIAGNOSIS — I110 Hypertensive heart disease with heart failure: Secondary | ICD-10-CM | POA: Diagnosis not present

## 2022-10-03 DIAGNOSIS — D631 Anemia in chronic kidney disease: Secondary | ICD-10-CM | POA: Diagnosis not present

## 2022-10-03 DIAGNOSIS — R300 Dysuria: Secondary | ICD-10-CM

## 2022-10-03 DIAGNOSIS — N10 Acute tubulo-interstitial nephritis: Secondary | ICD-10-CM | POA: Diagnosis not present

## 2022-10-03 DIAGNOSIS — N1831 Chronic kidney disease, stage 3a: Secondary | ICD-10-CM | POA: Diagnosis not present

## 2022-10-03 DIAGNOSIS — E1151 Type 2 diabetes mellitus with diabetic peripheral angiopathy without gangrene: Secondary | ICD-10-CM | POA: Diagnosis not present

## 2022-10-03 DIAGNOSIS — F250 Schizoaffective disorder, bipolar type: Secondary | ICD-10-CM | POA: Diagnosis not present

## 2022-10-03 DIAGNOSIS — E1140 Type 2 diabetes mellitus with diabetic neuropathy, unspecified: Secondary | ICD-10-CM | POA: Diagnosis not present

## 2022-10-03 DIAGNOSIS — E1122 Type 2 diabetes mellitus with diabetic chronic kidney disease: Secondary | ICD-10-CM | POA: Diagnosis not present

## 2022-10-03 DIAGNOSIS — I5020 Unspecified systolic (congestive) heart failure: Secondary | ICD-10-CM | POA: Diagnosis not present

## 2022-10-03 DIAGNOSIS — I5032 Chronic diastolic (congestive) heart failure: Secondary | ICD-10-CM | POA: Diagnosis not present

## 2022-10-03 DIAGNOSIS — M4692 Unspecified inflammatory spondylopathy, cervical region: Secondary | ICD-10-CM | POA: Diagnosis not present

## 2022-10-03 DIAGNOSIS — E1165 Type 2 diabetes mellitus with hyperglycemia: Secondary | ICD-10-CM | POA: Diagnosis not present

## 2022-10-03 DIAGNOSIS — B9620 Unspecified Escherichia coli [E. coli] as the cause of diseases classified elsewhere: Secondary | ICD-10-CM | POA: Diagnosis not present

## 2022-10-03 NOTE — Telephone Encounter (Signed)
Appt made

## 2022-10-03 NOTE — Telephone Encounter (Signed)
  Chief Complaint: Dysuria Symptoms: Burning with urination, frequency, urgency, lower back pain. Frequency: 1 week Pertinent Negatives: Patient denies fever Disposition: '[]'$ ED /'[]'$ Urgent Care (no appt availability in office) / '[]'$ Appointment(In office/virtual)/ '[]'$  [facility]/ '[]'$ Home Care/ '[]'$ Refused Recommended Disposition /'[]'$ [facility]/ '[x]'$  Follow-up with PCP Additional Notes: '[name]' RN with [facility] calling initially. States pt with UTI symptoms.  Called and spoke to pt: Pt seen OV 09/27/22. States mentioned symptoms  "But not as bad then." Reports "I get sepsis from these."  Declines appt "I don't transportation." States has US kidneys tomorrow. Questioning if urine sample can be brought to lab.  Care advise provided, pt verbalizes understanding.   Please advise. Reason for Disposition  Age > 50 years  Answer Assessment - Initial Assessment Questions 1. SEVERITY: "How bad is the pain?"  (e.g., Scale 1-10; mild, moderate, or severe)   - MILD (1-3): complains slightly about urination hurting   - MODERATE (4-7): interferes with normal activities     - SEVERE (8-10): excruciating, unwilling or unable to urinate because of the pain      Burning 2. FREQUENCY: "How many times have you had painful urination today?"      Many 3. PATTERN: "Is pain present every time you urinate or just sometimes?"      Burning pain 4. ONSET: "When did the painful urination start?"      1 week ago 5. FEVER: "Do you have a fever?" If Yes, ask: "What is your temperature, how was it measured, and when did it start?"     no 6. PAST UTI: "Have you had a urine infection before?" If Yes, ask: "When was the last time?" and "What happened that time?"      Yes 8. OTHER SYMPTOMS: "Do you have any other symptoms?" (e.g., blood in urine, flank pain, genital sores, urgency, vaginal discharge)     Frequency and urgency, lower back pain  Protocols used: Urination Pain - Female-A-AH

## 2022-10-03 NOTE — Progress Notes (Signed)
Name: [name]   MRN: [id number]    DOB: 11-24-1965   Date:10/03/2022       Progress Note  Subjective  Chief Complaint  Dysuria  I connected with  [name]  on 10/03/22 at  2:00 PM EST by a video enabled telemedicine application and verified that I am speaking with the correct person using two identifiers.  I discussed the limitations of evaluation and management by telemedicine and the availability of in person appointments. The patient expressed understanding and agreed to proceed with the virtual visit  Staff also discussed with the patient that there may be a patient responsible charge related to this service. Patient Location: at home  Provider Location: [facility] Additional Individuals present: husband  HPI  Dysuria: she states that over the past few days she has noticed urinary frequency and burning, also having low back pain. She was recently admitted for urosepsis recently. No fever or chills. No nausea or vomiting. Denies hematuria, mild has mild urine odor. She will return tomorrow to drop off urine sample for culture and urinalysis and get Rocephin shot, allergic to [facility] but just nausea and vomiting, no rashes.    Patient Active Problem List   Diagnosis Date Noted   Bacteremia due to Escherichia coli 09/13/2022   Encephalopathy acute 09/13/2022   Severe sepsis ([place]) 09/10/2022   Vertigo 02/10/2022   Abnormal urinalysis 02/10/2022   TIA (transient ischemic attack) 10/04/2021   HTN (hypertension) 10/04/2021   Chronic kidney disease, stage 3a ([place]) 10/04/2021   Depression with anxiety 10/04/2021   COPD (chronic obstructive pulmonary disease) ([place]) 10/04/2021   CAD (coronary artery disease) 10/04/2021   Chronic diastolic CHF (congestive heart failure) ([place]) 10/04/2021   Type II diabetes mellitus with renal manifestations ([place]) 06/30/2021   Interstitial cystitis 06/30/2021   Atherosclerosis of aorta ([place]) 03/28/2021   Iron deficiency anemia due to chronic blood loss     Polyp of ascending colon    Vitamin B12 deficiency 08/08/2018   Patient is full code 10/30/2017   Coronary artery disease of native artery of native heart with stable angina pectoris ([place]) 10/09/2017   Plantar fascial fibromatosis 10/09/2017   Tendinitis of wrist 10/09/2017   Schizoaffective disorder, bipolar type ([place]) 09/28/2017   Adenomatous colon polyp 10/15/2015   Bipolar affective disorder ([place]) 10/15/2015   CN (constipation) 10/15/2015   HLD (hyperlipidemia) 10/15/2015   NASH (nonalcoholic steatohepatitis) 10/15/2015   Obesity (BMI 35.0-39.9 without comorbidity) 10/15/2015   Restless leg syndrome 10/15/2015   Low back pain with left-sided sciatica 08/18/2015   Apnea, sleep 05/05/2015   Heart valve disease 26/83/4196   Complicated UTI (urinary tract infection) 03/24/2015   CKD stage 3 due to type 2 diabetes mellitus ([place]) 03/15/2015   Migraine 03/12/2015   Chronic bronchitis ([place]) 01/20/2015   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[place]) 01/20/2015   GERD (gastroesophageal reflux disease) 01/20/2015   Hypothyroid    Vitamin D deficiency    Urinary incontinence    Liver lesion    Hypomagnesemia    Obstructive sleep apnea 10/20/2013   Allergic rhinitis 07/04/2007    Past Surgical History:  Procedure Laterality Date   ABDOMINAL HYSTERECTOMY     Partial   bladder stim  2007   BLADDER SURGERY     x 2. tacking of bladder and the other was the stimulator placement   CHOLECYSTECTOMY     COLONOSCOPY WITH PROPOFOL N/A 03/04/2019   Procedure: COLONOSCOPY WITH PROPOFOL;  Surgeon: [name], MD;  Location:  [facility];  Service: Endoscopy;  Laterality: N/A;   ESOPHAGOGASTRODUODENOSCOPY (EGD) WITH PROPOFOL N/A 03/04/2019   Procedure: ESOPHAGOGASTRODUODENOSCOPY (EGD) WITH PROPOFOL;  Surgeon: [name], MD;  Location: [facility];  Service: Endoscopy;  Laterality: N/A;   INCISION AND DRAINAGE ABSCESS Right 04/16/2019   Procedure: INCISION AND DRAINAGE ABSCESS;  Surgeon: Benjamine Sprague, DO;  Location: [facility];  Service: General;  Laterality: Right;   INTERSTIM-GENERATOR CHANGE N/A 12/31/2017   Procedure: INTERSTIM-GENERATOR CHANGE;  Surgeon: Bjorn Loser, MD;  Location: [facility];  Service: Urology;  Laterality: N/A;   LIVER BIOPSY  March 2016   Negative   LUNG BIOPSY  2015   small spot on lung   TUBAL LIGATION     UPPER GI ENDOSCOPY     WRIST SURGERY Right 11/02/2019    Family History  Problem Relation Age of Onset   Diabetes Mother    Heart disease Mother    Hyperlipidemia Mother    Hypertension Mother    Kidney disease Mother    Mental illness Mother    Hypothyroidism Mother    Stroke Mother    Osteoporosis Mother    Glaucoma Mother    Congestive Heart Failure Mother    Hypertension Father    Kidney disease Brother    Intracerebral hemorrhage Brother    Breast cancer Maternal Grandmother 89   Heart disease Maternal Grandfather    Rheum arthritis Maternal Grandfather    Cancer Maternal Grandfather        liver   Kidney disease Maternal Grandfather    Cancer Paternal Grandmother        lung   Asthma Daughter    Cancer Daughter        throat   Bipolar disorder Daughter    Bipolar disorder Daughter    Hypertension Daughter    Restless legs syndrome Daughter     Social History   Socioeconomic History   Marital status: Married    Spouse name: Not on file   Number of children: 2   Years of education: 12   Highest education level: Not on file  Occupational History   Occupation: disabled  Tobacco Use   Smoking status: Never   Smokeless tobacco: Never  Vaping Use   Vaping Use: Never used  Substance and Sexual Activity   Alcohol use: No   Drug use: No   Sexual activity: Yes    Partners: Male    Birth control/protection: Surgical  Other Topics Concern   Not on file  Social History Narrative   Pt lives with husband and grandchildren   Social Determinants of Health   Financial Resource Strain: Low Risk  (02/02/2022)   Overall  Financial Resource Strain (CARDIA)    Difficulty of Paying Living Expenses: Not very hard  Food Insecurity: Food Insecurity Present (02/15/2022)   Hunger Vital Sign    Worried About Running Out of Food in the Last Year: Often true    Ran Out of Food in the Last Year: Often true  Transportation Needs: No Transportation Needs (02/02/2022)   PRAPARE - Hydrologist (Medical): No    Lack of Transportation (Non-Medical): No  Physical Activity: Inactive (02/02/2022)   Exercise Vital Sign    Days of Exercise per Week: 0 days    Minutes of Exercise per Session: 0 min  Stress: No Stress Concern Present (02/02/2022)   [place]    Feeling of Stress : Only  a little  Social Connections: Moderately Isolated (02/02/2022)   Social Connection and Isolation Panel [NHANES]    Frequency of Communication with Friends and Family: More than three times a week    Frequency of Social Gatherings with Friends and Family: More than three times a week    Attends Religious Services: Never    Marine scientist or Organizations: No    Attends Archivist Meetings: Never    Marital Status: Married  Human resources officer Violence: Not At Risk (02/02/2022)   Humiliation, Afraid, Rape, and Kick questionnaire    Fear of Current or Ex-Partner: No    Emotionally Abused: No    Physically Abused: No    Sexually Abused: No     Current Outpatient Medications:    atorvastatin (LIPITOR) 80 MG tablet, Take 80 mg by mouth at bedtime. , Disp: , Rfl:    busPIRone (BUSPAR) 30 MG tablet, Take 30 mg by mouth 2 (two) times daily., Disp: , Rfl:    citalopram (CELEXA) 20 MG tablet, Take 20 mg by mouth daily., Disp: , Rfl:    clopidogrel (PLAVIX) 75 MG tablet, Take 1 tablet (75 mg total) by mouth daily., Disp: 30 tablet, Rfl: 11   esomeprazole (NEXIUM) 40 MG capsule, Take 40 mg by mouth daily., Disp: , Rfl:    ezetimibe (ZETIA) 10 MG  tablet, Take 10 mg by mouth at bedtime., Disp: , Rfl:    fenofibrate micronized (LOFIBRA) 134 MG capsule, Take 134 mg by mouth at bedtime., Disp: , Rfl:    fluticasone (FLONASE) 50 MCG/ACT nasal spray, PLACE 2 SPRAYS INTO BOTH NOSTRILS DAILY, Disp: 16 g, Rfl: 2   gabapentin (NEURONTIN) 300 MG capsule, Take 300 mg by mouth 2 (two) times daily., Disp: , Rfl:    gabapentin (NEURONTIN) 600 MG tablet, Take 600 mg by mouth at bedtime., Disp: , Rfl:    GLYXAMBI 25-5 MG TABS, Take 1 tablet by mouth daily., Disp: , Rfl:    HUMALOG KWIKPEN 100 UNIT/ML KwikPen, Inject into the skin., Disp: , Rfl:    HUMULIN R U-500 KWIKPEN 500 UNIT/ML kwikpen, Inject 70 Units into the skin 3 (three) times daily with meals., Disp: , Rfl:    hydrOXYzine (ATARAX) 50 MG tablet, Take 50 mg by mouth 3 (three) times daily., Disp: , Rfl:    Insulin Aspart FlexPen (NOVOLOG) 100 UNIT/ML, Inject into the skin., Disp: , Rfl:    Insulin Pen Needle (PEN NEEDLES) 31G X 8 MM MISC, , Disp: , Rfl:    lamoTRIgine (LAMICTAL) 200 MG tablet, Take 200 mg by mouth 2 (two) times daily. , Disp: , Rfl:    linaclotide (LINZESS) 145 MCG CAPS capsule, TAKE 1 CAPSULE BY MOUTH DAILY BEFORE BREAKFAST (Patient taking differently: Take 145 mcg by mouth daily before breakfast.), Disp: 30 capsule, Rfl: 3   meclizine (ANTIVERT) 25 MG tablet, TAKE 1 TABLET BY MOUTH 3 TIMES DAILY AS NEEDED FOR DIZZINESS OR NAUSEA, Disp: 10 tablet, Rfl: 0   metoprolol tartrate (LOPRESSOR) 25 MG tablet, Take 25 mg by mouth 2 (two) times daily., Disp: , Rfl:    nitroGLYCERIN (NITROSTAT) 0.4 MG SL tablet, Place 1 tablet (0.4 mg total) under the tongue every 5 (five) minutes as needed for chest pain., Disp: 25 tablet, Rfl: 0   ondansetron (ZOFRAN-ODT) 4 MG disintegrating tablet, Take 1 tablet (4 mg total) by mouth every 8 (eight) hours as needed for nausea or vomiting., Disp: 8 tablet, Rfl: 0   OneTouch Delica Lancets 15Q MISC,  by Does not apply route., Disp: , Rfl:    promethazine  (PHENERGAN) 25 MG suppository, Place 1 suppository (25 mg total) rectally every 6 (six) hours as needed for nausea or vomiting., Disp: 12 each, Rfl: 0   QUEtiapine (SEROQUEL) 300 MG tablet, Take 300 mg by mouth at bedtime. Along with '200mg'$  dose for a total of '500mg'$ , Disp: , Rfl:    SUMAtriptan (IMITREX) 100 MG tablet, May repeat in 2 hours if headache persists or recurs., Disp: 10 tablet, Rfl: 0   SYNTHROID 100 MCG tablet, Take 100 mcg by mouth daily., Disp: , Rfl:    TRULICITY 1.5 OI/7.8MV SOPN, Inject 1.5 mg into the skin once a week. (Wednesdays), Disp: , Rfl:    Ubrogepant (UBRELVY) 100 MG TABS, Take 1 tablet by mouth every other day., Disp: 16 tablet, Rfl: 5  Allergies  Allergen Reactions   Mirtazapine Anaphylaxis    REACTION: closes throat   Morphine And Related     anaphylaxis   Sulfamethoxazole-Trimethoprim Rash    REACTION: Whelps all over   Amoxicillin Er Nausea And Vomiting    Pt doesn't remember problem with amoxicillin -  SE Has patient had a PCN reaction causing immediate rash, facial/tongue/throat swelling, SOB or lightheadedness with hypotension: No Has patient had a PCN reaction causing severe rash involving mucus membranes or skin necrosis: No Has patient had a PCN reaction that required hospitalization: No Has patient had a PCN reaction occurring within the last 10 years: Yes If all of the above answers are "NO", then may proceed with Cephalosporin use.   Codeine Itching    REACTION: itch, rash   Hydrocodone-Acetaminophen Rash    REACTION: rash   Keflex [Cephalexin] Rash    Pt does not remember having a reaction or rash, no airway involvement   Prasterone Nausea And Vomiting    I personally reviewed active problem list, medication list, allergies, family history, social history, health maintenance with the patient/caregiver today.   ROS  Ten systems reviewed and is negative except as mentioned in HPI   Objective  Virtual encounter, vitals not  obtained.  There is no height or weight on file to calculate BMI.  Physical Exam  Awake, alert and oriented   PHQ2/9:    10/03/2022    1:16 PM 09/27/2022   10:25 AM 04/27/2022   10:42 AM 02/21/2022   12:51 PM 02/02/2022    2:14 PM  Depression screen PHQ 2/9  Decreased Interest 0 0 '2 1 1  '$ Down, Depressed, Hopeless 0 0 '1 2 1  '$ PHQ - 2 Score 0 0 '3 3 2  '$ Altered sleeping 0 0 0 0 1  Tired, decreased energy 0 0 0 1 3  Change in appetite 0 0 1 1 0  Feeling bad or failure about yourself  0 0 0 0 0  Trouble concentrating 0 0 0 0 1  Moving slowly or fidgety/restless 0 0 0 0 1  Suicidal thoughts 0 0 0 0 0  PHQ-9 Score 0 0 '4 5 8  '$ Difficult doing work/chores    Somewhat difficult Somewhat difficult   PHQ-2/9 Result is negative.    Fall Risk:    10/03/2022    1:16 PM 09/27/2022   10:25 AM 04/27/2022   10:42 AM 02/21/2022   12:50 PM 02/02/2022    2:19 PM  Fall Risk   Falls in the past year? 0 '1 1 1 1  '$ Number falls in past yr: 0 '1 1 1 1  '$ Injury with  Fall? 0 0 0 0 0  Risk for fall due to : Impaired balance/gait History of fall(s);Impaired balance/gait Impaired balance/gait History of fall(s);Impaired balance/gait History of fall(s)  Follow up Falls prevention discussed Falls prevention discussed;Education provided;Falls evaluation completed Falls prevention discussed Falls evaluation completed Falls prevention discussed     Assessment & Plan  1. Dysuria  - CULTURE, URINE COMPREHENSIVE; Future   She is unable to come in person today due to lack of transportation. She has an [place] scheduled for 8 am tomorrow and will stop by here first to drop off urine and get a Rocephin 500 mg shot .  Patient is aware to go to [facility] or urgent care if worsening of symptoms from now until the morning   I discussed the assessment and treatment plan with the patient. The patient was provided an opportunity to ask questions and all were answered. The patient agreed with the plan and demonstrated an understanding of  the instructions.  The patient was advised to call back or seek an in-person evaluation if the symptoms worsen or if the condition fails to improve as anticipated.  I provided 15  minutes of non-face-to-face time during this encounter.

## 2022-10-04 ENCOUNTER — Other Ambulatory Visit: Payer: Self-pay | Admitting: Family Medicine

## 2022-10-04 ENCOUNTER — Encounter: Payer: Self-pay | Admitting: Family Medicine

## 2022-10-04 ENCOUNTER — Ambulatory Visit (INDEPENDENT_AMBULATORY_CARE_PROVIDER_SITE_OTHER): Payer: Medicare Other

## 2022-10-04 ENCOUNTER — Ambulatory Visit
Admission: RE | Admit: 2022-10-04 | Discharge: 2022-10-04 | Disposition: A | Discharge status: Home / Self Care | Payer: Medicare Other | Source: Ambulatory Visit | Attending: Family Medicine | Admitting: Family Medicine

## 2022-10-04 DIAGNOSIS — N1330 Unspecified hydronephrosis: Secondary | ICD-10-CM | POA: Diagnosis not present

## 2022-10-04 DIAGNOSIS — R161 Splenomegaly, not elsewhere classified: Secondary | ICD-10-CM | POA: Diagnosis not present

## 2022-10-04 DIAGNOSIS — R300 Dysuria: Secondary | ICD-10-CM

## 2022-10-04 DIAGNOSIS — N1339 Other hydronephrosis: Secondary | ICD-10-CM

## 2022-10-04 LAB — POCT URINALYSIS DIPSTICK
Glucose, UA: POSITIVE — AB
Ketones, UA: NEGATIVE
Nitrite, UA: NEGATIVE
Protein, UA: NEGATIVE
Spec Grav, UA: 1.01 (ref 1.010–1.025)
Urobilinogen, UA: 0.2 E.U./dL
pH, UA: 6.5 (ref 5.0–8.0)

## 2022-10-04 MED ORDER — CEFTRIAXONE SODIUM 500 MG IJ SOLR
500.0000 mg | Freq: Once | INTRAMUSCULAR | Status: AC
  Administered 2022-10-04: 500 mg via INTRAMUSCULAR

## 2022-10-04 MED ORDER — LIDOCAINE HCL (PF) 1 % IJ SOLN
2.0000 mL | Freq: Once | INTRAMUSCULAR | Status: AC
  Administered 2022-10-04: 2 mL via INTRADERMAL

## 2022-10-04 NOTE — Progress Notes (Signed)
[name] and I Re confirmed with patient: allergic to [facility] but just nausea and vomiting, no rashes. Pt verbalized to her knowledge yes. We let pt sit for 15 mins before letting her leave to ensure no reaction.

## 2022-10-04 NOTE — Addendum Note (Signed)
Addended by: Salomon Fick on: 10/04/2022 08:55 AM   Modules accepted: Orders

## 2022-10-06 DIAGNOSIS — N10 Acute tubulo-interstitial nephritis: Secondary | ICD-10-CM | POA: Diagnosis not present

## 2022-10-06 DIAGNOSIS — I5020 Unspecified systolic (congestive) heart failure: Secondary | ICD-10-CM | POA: Diagnosis not present

## 2022-10-06 DIAGNOSIS — B9620 Unspecified Escherichia coli [E. coli] as the cause of diseases classified elsewhere: Secondary | ICD-10-CM | POA: Diagnosis not present

## 2022-10-06 DIAGNOSIS — F250 Schizoaffective disorder, bipolar type: Secondary | ICD-10-CM | POA: Diagnosis not present

## 2022-10-06 DIAGNOSIS — I5032 Chronic diastolic (congestive) heart failure: Secondary | ICD-10-CM | POA: Diagnosis not present

## 2022-10-06 DIAGNOSIS — E1140 Type 2 diabetes mellitus with diabetic neuropathy, unspecified: Secondary | ICD-10-CM | POA: Diagnosis not present

## 2022-10-06 DIAGNOSIS — M4692 Unspecified inflammatory spondylopathy, cervical region: Secondary | ICD-10-CM | POA: Diagnosis not present

## 2022-10-06 DIAGNOSIS — E1122 Type 2 diabetes mellitus with diabetic chronic kidney disease: Secondary | ICD-10-CM | POA: Diagnosis not present

## 2022-10-06 DIAGNOSIS — D631 Anemia in chronic kidney disease: Secondary | ICD-10-CM | POA: Diagnosis not present

## 2022-10-06 DIAGNOSIS — I110 Hypertensive heart disease with heart failure: Secondary | ICD-10-CM | POA: Diagnosis not present

## 2022-10-06 DIAGNOSIS — N1831 Chronic kidney disease, stage 3a: Secondary | ICD-10-CM | POA: Diagnosis not present

## 2022-10-06 DIAGNOSIS — K861 Other chronic pancreatitis: Secondary | ICD-10-CM | POA: Diagnosis not present

## 2022-10-06 DIAGNOSIS — E440 Moderate protein-calorie malnutrition: Secondary | ICD-10-CM | POA: Diagnosis not present

## 2022-10-06 DIAGNOSIS — E1165 Type 2 diabetes mellitus with hyperglycemia: Secondary | ICD-10-CM | POA: Diagnosis not present

## 2022-10-06 DIAGNOSIS — E1151 Type 2 diabetes mellitus with diabetic peripheral angiopathy without gangrene: Secondary | ICD-10-CM | POA: Diagnosis not present

## 2022-10-06 LAB — CULTURE, URINE COMPREHENSIVE
MICRO NUMBER:: 14499716
SPECIMEN QUALITY:: ADEQUATE

## 2022-10-09 DIAGNOSIS — E1122 Type 2 diabetes mellitus with diabetic chronic kidney disease: Secondary | ICD-10-CM | POA: Diagnosis not present

## 2022-10-09 DIAGNOSIS — K861 Other chronic pancreatitis: Secondary | ICD-10-CM | POA: Diagnosis not present

## 2022-10-09 DIAGNOSIS — E1151 Type 2 diabetes mellitus with diabetic peripheral angiopathy without gangrene: Secondary | ICD-10-CM | POA: Diagnosis not present

## 2022-10-09 DIAGNOSIS — E1140 Type 2 diabetes mellitus with diabetic neuropathy, unspecified: Secondary | ICD-10-CM | POA: Diagnosis not present

## 2022-10-09 DIAGNOSIS — M4692 Unspecified inflammatory spondylopathy, cervical region: Secondary | ICD-10-CM | POA: Diagnosis not present

## 2022-10-09 DIAGNOSIS — N10 Acute tubulo-interstitial nephritis: Secondary | ICD-10-CM | POA: Diagnosis not present

## 2022-10-09 DIAGNOSIS — I5032 Chronic diastolic (congestive) heart failure: Secondary | ICD-10-CM | POA: Diagnosis not present

## 2022-10-09 DIAGNOSIS — E440 Moderate protein-calorie malnutrition: Secondary | ICD-10-CM | POA: Diagnosis not present

## 2022-10-09 DIAGNOSIS — E1165 Type 2 diabetes mellitus with hyperglycemia: Secondary | ICD-10-CM | POA: Diagnosis not present

## 2022-10-09 DIAGNOSIS — F250 Schizoaffective disorder, bipolar type: Secondary | ICD-10-CM | POA: Diagnosis not present

## 2022-10-09 DIAGNOSIS — I110 Hypertensive heart disease with heart failure: Secondary | ICD-10-CM | POA: Diagnosis not present

## 2022-10-09 DIAGNOSIS — N1831 Chronic kidney disease, stage 3a: Secondary | ICD-10-CM | POA: Diagnosis not present

## 2022-10-09 DIAGNOSIS — I5020 Unspecified systolic (congestive) heart failure: Secondary | ICD-10-CM | POA: Diagnosis not present

## 2022-10-09 DIAGNOSIS — B9620 Unspecified Escherichia coli [E. coli] as the cause of diseases classified elsewhere: Secondary | ICD-10-CM | POA: Diagnosis not present

## 2022-10-09 DIAGNOSIS — D631 Anemia in chronic kidney disease: Secondary | ICD-10-CM | POA: Diagnosis not present

## 2022-10-10 DIAGNOSIS — E1165 Type 2 diabetes mellitus with hyperglycemia: Secondary | ICD-10-CM | POA: Diagnosis not present

## 2022-10-10 DIAGNOSIS — D631 Anemia in chronic kidney disease: Secondary | ICD-10-CM | POA: Diagnosis not present

## 2022-10-10 DIAGNOSIS — E1140 Type 2 diabetes mellitus with diabetic neuropathy, unspecified: Secondary | ICD-10-CM | POA: Diagnosis not present

## 2022-10-10 DIAGNOSIS — E440 Moderate protein-calorie malnutrition: Secondary | ICD-10-CM | POA: Diagnosis not present

## 2022-10-10 DIAGNOSIS — E1151 Type 2 diabetes mellitus with diabetic peripheral angiopathy without gangrene: Secondary | ICD-10-CM | POA: Diagnosis not present

## 2022-10-10 DIAGNOSIS — N1831 Chronic kidney disease, stage 3a: Secondary | ICD-10-CM | POA: Diagnosis not present

## 2022-10-10 DIAGNOSIS — I110 Hypertensive heart disease with heart failure: Secondary | ICD-10-CM | POA: Diagnosis not present

## 2022-10-10 DIAGNOSIS — M4692 Unspecified inflammatory spondylopathy, cervical region: Secondary | ICD-10-CM | POA: Diagnosis not present

## 2022-10-10 DIAGNOSIS — N10 Acute tubulo-interstitial nephritis: Secondary | ICD-10-CM | POA: Diagnosis not present

## 2022-10-10 DIAGNOSIS — E1122 Type 2 diabetes mellitus with diabetic chronic kidney disease: Secondary | ICD-10-CM | POA: Diagnosis not present

## 2022-10-10 DIAGNOSIS — I5032 Chronic diastolic (congestive) heart failure: Secondary | ICD-10-CM | POA: Diagnosis not present

## 2022-10-10 DIAGNOSIS — K861 Other chronic pancreatitis: Secondary | ICD-10-CM | POA: Diagnosis not present

## 2022-10-10 DIAGNOSIS — F250 Schizoaffective disorder, bipolar type: Secondary | ICD-10-CM | POA: Diagnosis not present

## 2022-10-10 DIAGNOSIS — I5020 Unspecified systolic (congestive) heart failure: Secondary | ICD-10-CM | POA: Diagnosis not present

## 2022-10-10 DIAGNOSIS — B9620 Unspecified Escherichia coli [E. coli] as the cause of diseases classified elsewhere: Secondary | ICD-10-CM | POA: Diagnosis not present

## 2022-10-13 DIAGNOSIS — E440 Moderate protein-calorie malnutrition: Secondary | ICD-10-CM | POA: Diagnosis not present

## 2022-10-13 DIAGNOSIS — E1140 Type 2 diabetes mellitus with diabetic neuropathy, unspecified: Secondary | ICD-10-CM | POA: Diagnosis not present

## 2022-10-13 DIAGNOSIS — E1151 Type 2 diabetes mellitus with diabetic peripheral angiopathy without gangrene: Secondary | ICD-10-CM | POA: Diagnosis not present

## 2022-10-13 DIAGNOSIS — E1165 Type 2 diabetes mellitus with hyperglycemia: Secondary | ICD-10-CM | POA: Diagnosis not present

## 2022-10-13 DIAGNOSIS — N1831 Chronic kidney disease, stage 3a: Secondary | ICD-10-CM | POA: Diagnosis not present

## 2022-10-13 DIAGNOSIS — B9620 Unspecified Escherichia coli [E. coli] as the cause of diseases classified elsewhere: Secondary | ICD-10-CM | POA: Diagnosis not present

## 2022-10-13 DIAGNOSIS — I5020 Unspecified systolic (congestive) heart failure: Secondary | ICD-10-CM | POA: Diagnosis not present

## 2022-10-13 DIAGNOSIS — M4692 Unspecified inflammatory spondylopathy, cervical region: Secondary | ICD-10-CM | POA: Diagnosis not present

## 2022-10-13 DIAGNOSIS — N10 Acute tubulo-interstitial nephritis: Secondary | ICD-10-CM | POA: Diagnosis not present

## 2022-10-13 DIAGNOSIS — F250 Schizoaffective disorder, bipolar type: Secondary | ICD-10-CM | POA: Diagnosis not present

## 2022-10-13 DIAGNOSIS — I5032 Chronic diastolic (congestive) heart failure: Secondary | ICD-10-CM | POA: Diagnosis not present

## 2022-10-13 DIAGNOSIS — K861 Other chronic pancreatitis: Secondary | ICD-10-CM | POA: Diagnosis not present

## 2022-10-13 DIAGNOSIS — I110 Hypertensive heart disease with heart failure: Secondary | ICD-10-CM | POA: Diagnosis not present

## 2022-10-13 DIAGNOSIS — D631 Anemia in chronic kidney disease: Secondary | ICD-10-CM | POA: Diagnosis not present

## 2022-10-13 DIAGNOSIS — E1122 Type 2 diabetes mellitus with diabetic chronic kidney disease: Secondary | ICD-10-CM | POA: Diagnosis not present

## 2022-10-17 DIAGNOSIS — E039 Hypothyroidism, unspecified: Secondary | ICD-10-CM | POA: Diagnosis not present

## 2022-10-17 DIAGNOSIS — N1831 Chronic kidney disease, stage 3a: Secondary | ICD-10-CM | POA: Diagnosis not present

## 2022-10-17 DIAGNOSIS — G4733 Obstructive sleep apnea (adult) (pediatric): Secondary | ICD-10-CM | POA: Diagnosis not present

## 2022-10-17 DIAGNOSIS — D631 Anemia in chronic kidney disease: Secondary | ICD-10-CM | POA: Diagnosis not present

## 2022-10-17 DIAGNOSIS — J3489 Other specified disorders of nose and nasal sinuses: Secondary | ICD-10-CM | POA: Diagnosis not present

## 2022-10-17 DIAGNOSIS — E1151 Type 2 diabetes mellitus with diabetic peripheral angiopathy without gangrene: Secondary | ICD-10-CM | POA: Diagnosis not present

## 2022-10-17 DIAGNOSIS — F250 Schizoaffective disorder, bipolar type: Secondary | ICD-10-CM | POA: Diagnosis not present

## 2022-10-17 DIAGNOSIS — K861 Other chronic pancreatitis: Secondary | ICD-10-CM | POA: Diagnosis not present

## 2022-10-17 DIAGNOSIS — F419 Anxiety disorder, unspecified: Secondary | ICD-10-CM | POA: Diagnosis not present

## 2022-10-17 DIAGNOSIS — I110 Hypertensive heart disease with heart failure: Secondary | ICD-10-CM | POA: Diagnosis not present

## 2022-10-17 DIAGNOSIS — I5020 Unspecified systolic (congestive) heart failure: Secondary | ICD-10-CM | POA: Diagnosis not present

## 2022-10-17 DIAGNOSIS — G9341 Metabolic encephalopathy: Secondary | ICD-10-CM | POA: Diagnosis not present

## 2022-10-17 DIAGNOSIS — E1122 Type 2 diabetes mellitus with diabetic chronic kidney disease: Secondary | ICD-10-CM | POA: Diagnosis not present

## 2022-10-17 DIAGNOSIS — E1165 Type 2 diabetes mellitus with hyperglycemia: Secondary | ICD-10-CM | POA: Diagnosis not present

## 2022-10-17 DIAGNOSIS — E440 Moderate protein-calorie malnutrition: Secondary | ICD-10-CM | POA: Diagnosis not present

## 2022-10-17 DIAGNOSIS — M4692 Unspecified inflammatory spondylopathy, cervical region: Secondary | ICD-10-CM | POA: Diagnosis not present

## 2022-10-17 DIAGNOSIS — E785 Hyperlipidemia, unspecified: Secondary | ICD-10-CM | POA: Diagnosis not present

## 2022-10-17 DIAGNOSIS — M1990 Unspecified osteoarthritis, unspecified site: Secondary | ICD-10-CM | POA: Diagnosis not present

## 2022-10-17 DIAGNOSIS — I5032 Chronic diastolic (congestive) heart failure: Secondary | ICD-10-CM | POA: Diagnosis not present

## 2022-10-17 DIAGNOSIS — B9620 Unspecified Escherichia coli [E. coli] as the cause of diseases classified elsewhere: Secondary | ICD-10-CM | POA: Diagnosis not present

## 2022-10-17 DIAGNOSIS — E1140 Type 2 diabetes mellitus with diabetic neuropathy, unspecified: Secondary | ICD-10-CM | POA: Diagnosis not present

## 2022-10-17 DIAGNOSIS — K219 Gastro-esophageal reflux disease without esophagitis: Secondary | ICD-10-CM | POA: Diagnosis not present

## 2022-10-17 DIAGNOSIS — N10 Acute tubulo-interstitial nephritis: Secondary | ICD-10-CM | POA: Diagnosis not present

## 2022-10-17 DIAGNOSIS — I2510 Atherosclerotic heart disease of native coronary artery without angina pectoris: Secondary | ICD-10-CM | POA: Diagnosis not present

## 2022-10-17 DIAGNOSIS — F411 Generalized anxiety disorder: Secondary | ICD-10-CM | POA: Diagnosis not present

## 2022-10-18 DIAGNOSIS — E1122 Type 2 diabetes mellitus with diabetic chronic kidney disease: Secondary | ICD-10-CM | POA: Diagnosis not present

## 2022-10-18 DIAGNOSIS — I110 Hypertensive heart disease with heart failure: Secondary | ICD-10-CM | POA: Diagnosis not present

## 2022-10-18 DIAGNOSIS — B9620 Unspecified Escherichia coli [E. coli] as the cause of diseases classified elsewhere: Secondary | ICD-10-CM | POA: Diagnosis not present

## 2022-10-18 DIAGNOSIS — E1140 Type 2 diabetes mellitus with diabetic neuropathy, unspecified: Secondary | ICD-10-CM | POA: Diagnosis not present

## 2022-10-18 DIAGNOSIS — K861 Other chronic pancreatitis: Secondary | ICD-10-CM | POA: Diagnosis not present

## 2022-10-18 DIAGNOSIS — N1831 Chronic kidney disease, stage 3a: Secondary | ICD-10-CM | POA: Diagnosis not present

## 2022-10-18 DIAGNOSIS — N10 Acute tubulo-interstitial nephritis: Secondary | ICD-10-CM | POA: Diagnosis not present

## 2022-10-18 DIAGNOSIS — M4692 Unspecified inflammatory spondylopathy, cervical region: Secondary | ICD-10-CM | POA: Diagnosis not present

## 2022-10-18 DIAGNOSIS — I5020 Unspecified systolic (congestive) heart failure: Secondary | ICD-10-CM | POA: Diagnosis not present

## 2022-10-18 DIAGNOSIS — E1151 Type 2 diabetes mellitus with diabetic peripheral angiopathy without gangrene: Secondary | ICD-10-CM | POA: Diagnosis not present

## 2022-10-18 DIAGNOSIS — I5032 Chronic diastolic (congestive) heart failure: Secondary | ICD-10-CM | POA: Diagnosis not present

## 2022-10-18 DIAGNOSIS — E440 Moderate protein-calorie malnutrition: Secondary | ICD-10-CM | POA: Diagnosis not present

## 2022-10-18 DIAGNOSIS — F250 Schizoaffective disorder, bipolar type: Secondary | ICD-10-CM | POA: Diagnosis not present

## 2022-10-18 DIAGNOSIS — E1165 Type 2 diabetes mellitus with hyperglycemia: Secondary | ICD-10-CM | POA: Diagnosis not present

## 2022-10-18 DIAGNOSIS — D631 Anemia in chronic kidney disease: Secondary | ICD-10-CM | POA: Diagnosis not present

## 2022-10-23 DIAGNOSIS — D631 Anemia in chronic kidney disease: Secondary | ICD-10-CM | POA: Diagnosis not present

## 2022-10-23 DIAGNOSIS — M4692 Unspecified inflammatory spondylopathy, cervical region: Secondary | ICD-10-CM | POA: Diagnosis not present

## 2022-10-23 DIAGNOSIS — E1151 Type 2 diabetes mellitus with diabetic peripheral angiopathy without gangrene: Secondary | ICD-10-CM | POA: Diagnosis not present

## 2022-10-23 DIAGNOSIS — B9620 Unspecified Escherichia coli [E. coli] as the cause of diseases classified elsewhere: Secondary | ICD-10-CM | POA: Diagnosis not present

## 2022-10-23 DIAGNOSIS — E1122 Type 2 diabetes mellitus with diabetic chronic kidney disease: Secondary | ICD-10-CM | POA: Diagnosis not present

## 2022-10-23 DIAGNOSIS — N1831 Chronic kidney disease, stage 3a: Secondary | ICD-10-CM | POA: Diagnosis not present

## 2022-10-23 DIAGNOSIS — I5032 Chronic diastolic (congestive) heart failure: Secondary | ICD-10-CM | POA: Diagnosis not present

## 2022-10-23 DIAGNOSIS — E1165 Type 2 diabetes mellitus with hyperglycemia: Secondary | ICD-10-CM | POA: Diagnosis not present

## 2022-10-23 DIAGNOSIS — F250 Schizoaffective disorder, bipolar type: Secondary | ICD-10-CM | POA: Diagnosis not present

## 2022-10-23 DIAGNOSIS — K861 Other chronic pancreatitis: Secondary | ICD-10-CM | POA: Diagnosis not present

## 2022-10-23 DIAGNOSIS — I110 Hypertensive heart disease with heart failure: Secondary | ICD-10-CM | POA: Diagnosis not present

## 2022-10-23 DIAGNOSIS — E1140 Type 2 diabetes mellitus with diabetic neuropathy, unspecified: Secondary | ICD-10-CM | POA: Diagnosis not present

## 2022-10-23 DIAGNOSIS — I5020 Unspecified systolic (congestive) heart failure: Secondary | ICD-10-CM | POA: Diagnosis not present

## 2022-10-23 DIAGNOSIS — E440 Moderate protein-calorie malnutrition: Secondary | ICD-10-CM | POA: Diagnosis not present

## 2022-10-23 DIAGNOSIS — N10 Acute tubulo-interstitial nephritis: Secondary | ICD-10-CM | POA: Diagnosis not present

## 2022-10-24 DIAGNOSIS — M4692 Unspecified inflammatory spondylopathy, cervical region: Secondary | ICD-10-CM | POA: Diagnosis not present

## 2022-10-24 DIAGNOSIS — E440 Moderate protein-calorie malnutrition: Secondary | ICD-10-CM | POA: Diagnosis not present

## 2022-10-24 DIAGNOSIS — I110 Hypertensive heart disease with heart failure: Secondary | ICD-10-CM | POA: Diagnosis not present

## 2022-10-24 DIAGNOSIS — E7800 Pure hypercholesterolemia, unspecified: Secondary | ICD-10-CM | POA: Diagnosis not present

## 2022-10-24 DIAGNOSIS — K861 Other chronic pancreatitis: Secondary | ICD-10-CM | POA: Diagnosis not present

## 2022-10-24 DIAGNOSIS — E1165 Type 2 diabetes mellitus with hyperglycemia: Secondary | ICD-10-CM | POA: Diagnosis not present

## 2022-10-24 DIAGNOSIS — E1122 Type 2 diabetes mellitus with diabetic chronic kidney disease: Secondary | ICD-10-CM | POA: Diagnosis not present

## 2022-10-24 DIAGNOSIS — E1140 Type 2 diabetes mellitus with diabetic neuropathy, unspecified: Secondary | ICD-10-CM | POA: Diagnosis not present

## 2022-10-24 DIAGNOSIS — F250 Schizoaffective disorder, bipolar type: Secondary | ICD-10-CM | POA: Diagnosis not present

## 2022-10-24 DIAGNOSIS — E039 Hypothyroidism, unspecified: Secondary | ICD-10-CM | POA: Diagnosis not present

## 2022-10-24 DIAGNOSIS — N10 Acute tubulo-interstitial nephritis: Secondary | ICD-10-CM | POA: Diagnosis not present

## 2022-10-24 DIAGNOSIS — I10 Essential (primary) hypertension: Secondary | ICD-10-CM | POA: Diagnosis not present

## 2022-10-24 DIAGNOSIS — I5020 Unspecified systolic (congestive) heart failure: Secondary | ICD-10-CM | POA: Diagnosis not present

## 2022-10-24 DIAGNOSIS — B9620 Unspecified Escherichia coli [E. coli] as the cause of diseases classified elsewhere: Secondary | ICD-10-CM | POA: Diagnosis not present

## 2022-10-24 DIAGNOSIS — N1831 Chronic kidney disease, stage 3a: Secondary | ICD-10-CM | POA: Diagnosis not present

## 2022-10-24 DIAGNOSIS — E1151 Type 2 diabetes mellitus with diabetic peripheral angiopathy without gangrene: Secondary | ICD-10-CM | POA: Diagnosis not present

## 2022-10-24 DIAGNOSIS — D631 Anemia in chronic kidney disease: Secondary | ICD-10-CM | POA: Diagnosis not present

## 2022-10-24 DIAGNOSIS — I5032 Chronic diastolic (congestive) heart failure: Secondary | ICD-10-CM | POA: Diagnosis not present

## 2022-10-24 LAB — LIPID PANEL
Cholesterol: 86 (ref 0–200)
HDL: 27 — AB (ref 35–70)
LDl/HDL Ratio: 1
Triglycerides: 213 — AB (ref 40–160)

## 2022-10-24 LAB — HEMOGLOBIN A1C: Hemoglobin A1C: 8.2

## 2022-10-24 LAB — BASIC METABOLIC PANEL
Chloride: 105 (ref 99–108)
Glucose: 245
Potassium: 4.6 mEq/L (ref 3.5–5.1)
Sodium: 142 (ref 137–147)

## 2022-10-24 LAB — TSH: TSH: 0.76 (ref 0.41–5.90)

## 2022-10-27 ENCOUNTER — Ambulatory Visit: Payer: Medicare Other | Admitting: Internal Medicine

## 2022-10-30 DIAGNOSIS — D631 Anemia in chronic kidney disease: Secondary | ICD-10-CM | POA: Diagnosis not present

## 2022-10-30 DIAGNOSIS — N10 Acute tubulo-interstitial nephritis: Secondary | ICD-10-CM | POA: Diagnosis not present

## 2022-10-30 DIAGNOSIS — I5032 Chronic diastolic (congestive) heart failure: Secondary | ICD-10-CM | POA: Diagnosis not present

## 2022-10-30 DIAGNOSIS — I5020 Unspecified systolic (congestive) heart failure: Secondary | ICD-10-CM | POA: Diagnosis not present

## 2022-10-30 DIAGNOSIS — K861 Other chronic pancreatitis: Secondary | ICD-10-CM | POA: Diagnosis not present

## 2022-10-30 DIAGNOSIS — I110 Hypertensive heart disease with heart failure: Secondary | ICD-10-CM | POA: Diagnosis not present

## 2022-10-30 DIAGNOSIS — M4692 Unspecified inflammatory spondylopathy, cervical region: Secondary | ICD-10-CM | POA: Diagnosis not present

## 2022-10-30 DIAGNOSIS — B9620 Unspecified Escherichia coli [E. coli] as the cause of diseases classified elsewhere: Secondary | ICD-10-CM | POA: Diagnosis not present

## 2022-10-30 DIAGNOSIS — E440 Moderate protein-calorie malnutrition: Secondary | ICD-10-CM | POA: Diagnosis not present

## 2022-10-30 DIAGNOSIS — E1151 Type 2 diabetes mellitus with diabetic peripheral angiopathy without gangrene: Secondary | ICD-10-CM | POA: Diagnosis not present

## 2022-10-30 DIAGNOSIS — E1122 Type 2 diabetes mellitus with diabetic chronic kidney disease: Secondary | ICD-10-CM | POA: Diagnosis not present

## 2022-10-30 DIAGNOSIS — F250 Schizoaffective disorder, bipolar type: Secondary | ICD-10-CM | POA: Diagnosis not present

## 2022-10-30 DIAGNOSIS — E1165 Type 2 diabetes mellitus with hyperglycemia: Secondary | ICD-10-CM | POA: Diagnosis not present

## 2022-10-30 DIAGNOSIS — E1140 Type 2 diabetes mellitus with diabetic neuropathy, unspecified: Secondary | ICD-10-CM | POA: Diagnosis not present

## 2022-10-30 DIAGNOSIS — N1831 Chronic kidney disease, stage 3a: Secondary | ICD-10-CM | POA: Diagnosis not present

## 2022-10-31 ENCOUNTER — Encounter: Payer: Self-pay | Admitting: Emergency Medicine

## 2022-10-31 ENCOUNTER — Emergency Department
Admission: EM | Admit: 2022-10-31 | Discharge: 2022-10-31 | Disposition: A | Discharge status: Home / Self Care | Payer: Medicare Other | Attending: Emergency Medicine | Admitting: Emergency Medicine

## 2022-10-31 ENCOUNTER — Emergency Department: Payer: Medicare Other

## 2022-10-31 ENCOUNTER — Other Ambulatory Visit: Payer: Self-pay

## 2022-10-31 DIAGNOSIS — R410 Disorientation, unspecified: Secondary | ICD-10-CM | POA: Diagnosis not present

## 2022-10-31 DIAGNOSIS — K760 Fatty (change of) liver, not elsewhere classified: Secondary | ICD-10-CM | POA: Diagnosis not present

## 2022-10-31 DIAGNOSIS — K5900 Constipation, unspecified: Secondary | ICD-10-CM | POA: Insufficient documentation

## 2022-10-31 DIAGNOSIS — E6609 Other obesity due to excess calories: Secondary | ICD-10-CM | POA: Diagnosis not present

## 2022-10-31 DIAGNOSIS — J449 Chronic obstructive pulmonary disease, unspecified: Secondary | ICD-10-CM | POA: Diagnosis not present

## 2022-10-31 DIAGNOSIS — N189 Chronic kidney disease, unspecified: Secondary | ICD-10-CM | POA: Insufficient documentation

## 2022-10-31 DIAGNOSIS — I10 Essential (primary) hypertension: Secondary | ICD-10-CM | POA: Diagnosis not present

## 2022-10-31 DIAGNOSIS — R531 Weakness: Secondary | ICD-10-CM | POA: Diagnosis not present

## 2022-10-31 DIAGNOSIS — E1122 Type 2 diabetes mellitus with diabetic chronic kidney disease: Secondary | ICD-10-CM | POA: Insufficient documentation

## 2022-10-31 DIAGNOSIS — I5030 Unspecified diastolic (congestive) heart failure: Secondary | ICD-10-CM | POA: Insufficient documentation

## 2022-10-31 DIAGNOSIS — R42 Dizziness and giddiness: Secondary | ICD-10-CM

## 2022-10-31 DIAGNOSIS — I2510 Atherosclerotic heart disease of native coronary artery without angina pectoris: Secondary | ICD-10-CM | POA: Insufficient documentation

## 2022-10-31 DIAGNOSIS — I700 Atherosclerosis of aorta: Secondary | ICD-10-CM | POA: Diagnosis not present

## 2022-10-31 DIAGNOSIS — D631 Anemia in chronic kidney disease: Secondary | ICD-10-CM | POA: Diagnosis not present

## 2022-10-31 DIAGNOSIS — I130 Hypertensive heart and chronic kidney disease with heart failure and stage 1 through stage 4 chronic kidney disease, or unspecified chronic kidney disease: Secondary | ICD-10-CM | POA: Diagnosis not present

## 2022-10-31 DIAGNOSIS — R0902 Hypoxemia: Secondary | ICD-10-CM | POA: Diagnosis not present

## 2022-10-31 DIAGNOSIS — R109 Unspecified abdominal pain: Secondary | ICD-10-CM | POA: Diagnosis not present

## 2022-10-31 DIAGNOSIS — R200 Anesthesia of skin: Secondary | ICD-10-CM | POA: Diagnosis not present

## 2022-10-31 DIAGNOSIS — E860 Dehydration: Secondary | ICD-10-CM | POA: Diagnosis not present

## 2022-10-31 DIAGNOSIS — E1165 Type 2 diabetes mellitus with hyperglycemia: Secondary | ICD-10-CM | POA: Diagnosis not present

## 2022-10-31 LAB — URINALYSIS, ROUTINE W REFLEX MICROSCOPIC
Bacteria, UA: NONE SEEN
Bilirubin Urine: NEGATIVE
Glucose, UA: >=500 mg/dL — AB
Hgb urine dipstick: NEGATIVE
Ketones, ur: NEGATIVE mg/dL
Leukocytes,Ua: NEGATIVE
Nitrite: NEGATIVE
Protein, ur: NEGATIVE mg/dL
Specific Gravity, Urine: 1.024 (ref 1.005–1.030)
pH: 5 (ref 5.0–8.0)

## 2022-10-31 LAB — CBC WITH DIFFERENTIAL/PLATELET
Abs Immature Granulocytes: 0.03 10*3/uL (ref 0.00–0.07)
Basophils Absolute: 0 10*3/uL (ref 0.0–0.1)
Basophils Relative: 1 %
Eosinophils Absolute: 0.3 10*3/uL (ref 0.0–0.5)
Eosinophils Relative: 4 %
HCT: 39.5 % (ref 36.0–46.0)
Hemoglobin: 12.4 g/dL (ref 12.0–15.0)
Immature Granulocytes: 1 %
Lymphocytes Relative: 38 %
Lymphs Abs: 2.4 10*3/uL (ref 0.7–4.0)
MCH: 28.8 pg (ref 26.0–34.0)
MCHC: 31.4 g/dL (ref 30.0–36.0)
MCV: 91.6 fL (ref 80.0–100.0)
Monocytes Absolute: 0.5 10*3/uL (ref 0.1–1.0)
Monocytes Relative: 8 %
Neutro Abs: 3.1 10*3/uL (ref 1.7–7.7)
Neutrophils Relative %: 48 %
Platelets: 254 10*3/uL (ref 150–400)
RBC: 4.31 MIL/uL (ref 3.87–5.11)
RDW: 14 % (ref 11.5–15.5)
WBC: 6.3 10*3/uL (ref 4.0–10.5)
nRBC: 0 % (ref 0.0–0.2)

## 2022-10-31 LAB — COMPREHENSIVE METABOLIC PANEL
ALT: 36 U/L (ref 0–44)
AST: 42 U/L — ABNORMAL HIGH (ref 15–41)
Albumin: 3.6 g/dL (ref 3.5–5.0)
Alkaline Phosphatase: 44 U/L (ref 38–126)
Anion gap: 11 (ref 5–15)
BUN: 29 mg/dL — ABNORMAL HIGH (ref 6–20)
CO2: 28 mmol/L (ref 22–32)
Calcium: 9.6 mg/dL (ref 8.9–10.3)
Chloride: 103 mmol/L (ref 98–111)
Creatinine, Ser: 1.19 mg/dL — ABNORMAL HIGH (ref 0.44–1.00)
GFR, Estimated: 53 mL/min — ABNORMAL LOW (ref >60)
Glucose, Bld: 101 mg/dL — ABNORMAL HIGH (ref 70–99)
Potassium: 3.8 mmol/L (ref 3.5–5.1)
Sodium: 142 mmol/L (ref 135–145)
Total Bilirubin: 0.5 mg/dL (ref 0.3–1.2)
Total Protein: 6.2 g/dL — ABNORMAL LOW (ref 6.5–8.1)

## 2022-10-31 LAB — CBG MONITORING, ED: Glucose-Capillary: 47 mg/dL — ABNORMAL LOW (ref 70–99)

## 2022-10-31 LAB — TROPONIN I (HIGH SENSITIVITY)
Troponin I (High Sensitivity): 4 ng/L (ref <18)
Troponin I (High Sensitivity): 4 ng/L (ref <18)

## 2022-10-31 MED ORDER — IOHEXOL 300 MG/ML  SOLN
100.0000 mL | Freq: Once | INTRAMUSCULAR | Status: AC | PRN
  Administered 2022-10-31: 75 mL via INTRAVENOUS

## 2022-10-31 MED ORDER — SODIUM CHLORIDE 0.9 % IV BOLUS
1000.0000 mL | Freq: Once | INTRAVENOUS | Status: AC
  Administered 2022-10-31: 1000 mL via INTRAVENOUS

## 2022-10-31 NOTE — ED Triage Notes (Addendum)
Pt from home via ems for confusion, dizziness, mouth numbness that woke her up 2/23 @ 2300. Pts LKW was 2/24 @ 2000. Pt stats that last time she felt this way she had a uti

## 2022-10-31 NOTE — ED Provider Notes (Signed)
[facility] Provider Note    Event Date/Time   First MD Initiated Contact with Patient 10/31/22 1504     (approximate)   History   Dizziness, Altered Mental Status, and Numbness   HPI  [name] is a 57 y.o. female past medical history significant for HFpEF, chronic migraine, CKD, COPD on 2-3 home oxygen, CAD, DM, GERD, OSA, bipolar disorder, who presents to the emergency department for not feeling well.  Patient states that she has a history of vertigo in the past.  Over the past couple of days has not been eating because she has not felt well and has not been hungry.  Does state that she is hungry at this time.  States that she has been having urinary urgency and frequency with burning with urination.  Right flank pain.  Denies any nausea or vomiting.  Denies any diarrhea.  No falls or trauma.  Denies any fever or chills.  Does state today that she has been feeling more dizzy than her normal.  Does not feel like she has room spinning.  No headache at this time.  No change in vision.  Denies any chest pain or shortness of breath.  Prior cholecystectomy.     Physical Exam   Triage Vital Signs: ED Triage Vitals  Enc Vitals Group     BP 10/31/22 1310 99/65     Pulse Rate 10/31/22 1310 73     Resp 10/31/22 1310 16     Temp 10/31/22 1310 98.3 F (36.8 C)     Temp Source 10/31/22 1310 Oral     SpO2 10/31/22 1310 98 %     Weight 10/31/22 1314 177 lb (80.3 kg)     Height 10/31/22 1314 '4\' 11"'$  (1.499 m)     Head Circumference --      Peak Flow --      Pain Score 10/31/22 1314 0     Pain Loc --      Pain Edu? --      Excl. in [place]? --     Most recent vital signs: Vitals:   10/31/22 1630 10/31/22 1700  BP: (!) 104/51 125/62  Pulse: 74 80  Resp:    Temp:    SpO2: 93% 93%    Physical Exam Constitutional:      Appearance: She is well-developed.  HENT:     Head: Atraumatic.     Comments: Dry mucous membranes Eyes:     Conjunctiva/sclera:  Conjunctivae normal.  Cardiovascular:     Rate and Rhythm: Regular rhythm.  Pulmonary:     Effort: No respiratory distress.  Abdominal:     Tenderness: There is abdominal tenderness (Right lower quadrant and right CVA tenderness).  Genitourinary:    Comments: DRE with no impaction Musculoskeletal:        General: Normal range of motion.     Cervical back: Normal range of motion.  Skin:    General: Skin is warm.     Capillary Refill: Capillary refill takes less than 2 seconds.  Neurological:     Mental Status: She is alert. Mental status is at baseline.     Comments: Cranial nerves grossly intact.  5/5 strength bilateral upper and lower extremities.  Sensation intact in upper and lower extremities.  Normal gait.     IMPRESSION / MDM / ASSESSMENT AND PLAN / ED COURSE  I reviewed the triage vital signs and the nursing notes.  On chart review patient had a  recent hospitalization in January for sepsis secondary to a urinary tract infection.  Patient had pansensitive urine cultures at that time.  Differential diagnosis including infectious process, urinary tract infection, pyelonephritis, appendicitis, dehydration, electrolyte abnormalities, hyperglycemia, ACS  EKG  I, Nathaniel Man, the attending physician, personally viewed and interpreted this ECG.   Rate: Normal  Rhythm: Normal sinus  Axis: Normal  Intervals: Normal  ST&T Change: None  No tachycardic or bradycardic dysrhythmias while on cardiac telemetry.  RADIOLOGY I independently reviewed imaging, my interpretation of imaging: Chest x-ray no focal findings consistent with pneumonia  CT abdomen and pelvis with no signs of intra-abdominal pathology.  Chest x-ray with no signs of pneumonia.  Read a significant stool burden on CT scan  LABS (all labs ordered are listed, but only abnormal results are displayed) Labs interpreted as -   No significant electrolyte abnormalities.  Glucose came back low, given p.o. and  improved. Labs Reviewed  COMPREHENSIVE METABOLIC PANEL - Abnormal; Notable for the following components:      Result Value   Glucose, Bld 101 (*)    BUN 29 (*)    Creatinine, Ser 1.19 (*)    Total Protein 6.2 (*)    AST 42 (*)    GFR, Estimated 53 (*)    All other components within normal limits  URINALYSIS, ROUTINE W REFLEX MICROSCOPIC - Abnormal; Notable for the following components:   Color, Urine YELLOW (*)    APPearance HAZY (*)    Glucose, UA >=500 (*)    All other components within normal limits  CBG MONITORING, ED - Abnormal; Notable for the following components:   Glucose-Capillary 47 (*)    All other components within normal limits  CBC WITH DIFFERENTIAL/PLATELET  TROPONIN I (HIGH SENSITIVITY)  TROPONIN I (HIGH SENSITIVITY)    TREATMENT  1 L of IV fluids  MDM  Clinical picture is not consistent with ACS.  No signs of urinary tract infection or pyelonephritis.  Creatinine is at baseline with no significant electrolyte abnormalities.  No significant leukocytosis or anemia.  Has a nonfocal neurologic exam at this time.  Have a low suspicion for CVA.  Patient does have a soft blood pressure while in the emergency department and has a history of hypertension.  No recent changes to her home medications.  Does endorse decreased p.o. intake.  Will give 1 L of IV fluids given signs of dehydration and reevaluate.  Orthostatic blood pressures are negative.  No dysmetria or past pointing, normal gait in the emergency department.  Plan for CT abdomen and pelvis and will reevaluate.    On reevaluation patient had significant improvement.  Ambulating in the emergency department without any difficulties.  Repeat blood pressure within normal limits.  Tolerating p.o.  Signs of constipation.  Discussed symptomatic treatment for her constipation and given return precautions.  Discussed eating hydrating and drink plenty of fluids.   PROCEDURES:  Critical Care performed:  No  Procedures  Patient's presentation is most consistent with acute presentation with potential threat to life or bodily function.   MEDICATIONS ORDERED IN ED: Medications  sodium chloride 0.9 % bolus 1,000 mL (0 mLs Intravenous Stopped 10/31/22 1738)  iohexol (OMNIPAQUE) 300 MG/ML solution 100 mL (75 mLs Intravenous Contrast Given 10/31/22 1539)    FINAL CLINICAL IMPRESSION(S) / ED DIAGNOSES   Final diagnoses:  Dehydration  Dizziness  Constipation, unspecified constipation type     Rx / DC Orders   ED Discharge Orders     None  Note:  This document was prepared using Dragon voice recognition software and may include unintentional dictation errors.   Nathaniel Man, MD 10/31/22 2330

## 2022-10-31 NOTE — ED Notes (Signed)
Patient transported to CT 

## 2022-10-31 NOTE — ED Notes (Signed)
Pt able to get out of bed and ambulate to toilet independently. Pt states, "I don't feel as dizzy."

## 2022-10-31 NOTE — Discharge Instructions (Signed)
You were seen in the emergency department and dehydrated.  Make sure that you stay hydrated and drink plenty of fluids at home.  Follow-up closely with your primary care provider.  Constipation management - take MiraLAX (4 capfulls) mixed with 32-64 ounces of fluid.  Drink this entire drink.  If you do not have a bowel movement that day you can repeat the next day and add 2 capfuls for a total of 6 capfuls of MiraLAX.  MiraLAX does not work if you do not drink plenty of fluids with it. Once you have a good bowel movement, take 1 capfull of Miralax daily until having regular bowel movements.

## 2022-10-31 NOTE — ED Notes (Signed)
First nurse note: Pt arrives via EMS from home with weakness for a week. 119/70 76 HR 23RR 92% RA

## 2022-11-01 DIAGNOSIS — E1122 Type 2 diabetes mellitus with diabetic chronic kidney disease: Secondary | ICD-10-CM | POA: Diagnosis not present

## 2022-11-01 DIAGNOSIS — F250 Schizoaffective disorder, bipolar type: Secondary | ICD-10-CM | POA: Diagnosis not present

## 2022-11-01 DIAGNOSIS — M4692 Unspecified inflammatory spondylopathy, cervical region: Secondary | ICD-10-CM | POA: Diagnosis not present

## 2022-11-01 DIAGNOSIS — E1151 Type 2 diabetes mellitus with diabetic peripheral angiopathy without gangrene: Secondary | ICD-10-CM | POA: Diagnosis not present

## 2022-11-01 DIAGNOSIS — I5020 Unspecified systolic (congestive) heart failure: Secondary | ICD-10-CM | POA: Diagnosis not present

## 2022-11-01 DIAGNOSIS — N10 Acute tubulo-interstitial nephritis: Secondary | ICD-10-CM | POA: Diagnosis not present

## 2022-11-01 DIAGNOSIS — B9620 Unspecified Escherichia coli [E. coli] as the cause of diseases classified elsewhere: Secondary | ICD-10-CM | POA: Diagnosis not present

## 2022-11-01 DIAGNOSIS — E440 Moderate protein-calorie malnutrition: Secondary | ICD-10-CM | POA: Diagnosis not present

## 2022-11-01 DIAGNOSIS — N1831 Chronic kidney disease, stage 3a: Secondary | ICD-10-CM | POA: Diagnosis not present

## 2022-11-01 DIAGNOSIS — D631 Anemia in chronic kidney disease: Secondary | ICD-10-CM | POA: Diagnosis not present

## 2022-11-01 DIAGNOSIS — E1140 Type 2 diabetes mellitus with diabetic neuropathy, unspecified: Secondary | ICD-10-CM | POA: Diagnosis not present

## 2022-11-01 DIAGNOSIS — K861 Other chronic pancreatitis: Secondary | ICD-10-CM | POA: Diagnosis not present

## 2022-11-01 DIAGNOSIS — I110 Hypertensive heart disease with heart failure: Secondary | ICD-10-CM | POA: Diagnosis not present

## 2022-11-01 DIAGNOSIS — E1165 Type 2 diabetes mellitus with hyperglycemia: Secondary | ICD-10-CM | POA: Diagnosis not present

## 2022-11-01 DIAGNOSIS — I5032 Chronic diastolic (congestive) heart failure: Secondary | ICD-10-CM | POA: Diagnosis not present

## 2022-11-01 NOTE — Progress Notes (Unsigned)
There were no vitals taken for this visit.   Subjective:    Patient ID: [name], female    DOB: [birth year], 57 y.o.   MRN: [id number]  HPI: [name] is a 57 y.o. female  No chief complaint on file.  Er follow up/dizziness/constipation/dehydration: patient was seen in emergency room on 10/31/2022. She arrived to the er with complaint of not feeling well. She had reported over the last couple days she had not been eating because she was not feeling wel and not hungry.  She did report urinary urgency and frequency with burning with urination and right flank pain. She denied nausea and vomiting.  Denied diarrhea.  No falls or trauma, chills or fever.  She did report that she was more dizzy than her normal to er provider.  She denied the room spinning.  No headache, changes in vision, no chest pain or shortness of breath.  Ct abdomen pelvis/chest xray/head ct results below.  Labs: consistent with previous results.  Patient was given a liter of normal saline. Patient here for er follow up.  Patient reports ***.  Ct abdomen/pelvis;1. No acute CT findings of the abdomen or pelvis to explain right lower quadrant abdominal pain. Normal appendix.2. Large burden of stool and stool balls throughout the colon and rectum.3. Hepatomegaly and hepatic steatosis  Ct head:No acute intracranial abnormality.   Chest xray: No evidence of acute cardiopulmonary disease   Relevant past medical, surgical, family and social history reviewed and updated as indicated. Interim medical history since our last visit reviewed. Allergies and medications reviewed and updated.  Review of Systems  Constitutional: Negative for fever or weight change.  Respiratory: Negative for cough and shortness of breath.   Cardiovascular: Negative for chest pain or palpitations.  Gastrointestinal: Negative for abdominal pain, no bowel changes.  Musculoskeletal: Negative for gait problem or joint swelling.  Skin: Negative for  rash.  Neurological: Negative for dizziness or headache.  No other specific complaints in a complete review of systems (except as listed in HPI above).      Objective:    There were no vitals taken for this visit.  Wt Readings from Last 3 Encounters:  10/31/22 177 lb (80.3 kg)  09/27/22 188 lb 9.6 oz (85.5 kg)  09/11/22 183 lb 9.6 oz (83.3 kg)    Physical Exam  Constitutional: Patient appears well-developed and well-nourished. Obese *** No distress.  HEENT: head atraumatic, normocephalic, pupils equal and reactive to light, ears ***, neck supple, throat within normal limits Cardiovascular: Normal rate, regular rhythm and normal heart sounds.  No murmur heard. No BLE edema. Pulmonary/Chest: Effort normal and breath sounds normal. No respiratory distress. Abdominal: Soft.  There is no tenderness. Psychiatric: Patient has a normal mood and affect. behavior is normal. Judgment and thought content normal.  Results for orders placed or performed during the hospital encounter of 10/31/22  CBC with Differential  Result Value Ref Range   WBC 6.3 4.0 - 10.5 K/uL   RBC 4.31 3.87 - 5.11 MIL/uL   Hemoglobin 12.4 12.0 - 15.0 g/dL   HCT 39.5 36.0 - 46.0 %   MCV 91.6 80.0 - 100.0 fL   MCH 28.8 26.0 - 34.0 pg   MCHC 31.4 30.0 - 36.0 g/dL   RDW 14.0 11.5 - 15.5 %   Platelets 254 150 - 400 K/uL   nRBC 0.0 0.0 - 0.2 %   Neutrophils Relative % 48 %   Neutro Abs 3.1 1.7 - 7.7 K/uL  Lymphocytes Relative 38 %   Lymphs Abs 2.4 0.7 - 4.0 K/uL   Monocytes Relative 8 %   Monocytes Absolute 0.5 0.1 - 1.0 K/uL   Eosinophils Relative 4 %   Eosinophils Absolute 0.3 0.0 - 0.5 K/uL   Basophils Relative 1 %   Basophils Absolute 0.0 0.0 - 0.1 K/uL   Immature Granulocytes 1 %   Abs Immature Granulocytes 0.03 0.00 - 0.07 K/uL  Comprehensive metabolic panel  Result Value Ref Range   Sodium 142 135 - 145 mmol/L   Potassium 3.8 3.5 - 5.1 mmol/L   Chloride 103 98 - 111 mmol/L   CO2 28 22 - 32 mmol/L    Glucose, Bld 101 (H) 70 - 99 mg/dL   BUN 29 (H) 6 - 20 mg/dL   Creatinine, Ser 1.19 (H) 0.44 - 1.00 mg/dL   Calcium 9.6 8.9 - 10.3 mg/dL   Total Protein 6.2 (L) 6.5 - 8.1 g/dL   Albumin 3.6 3.5 - 5.0 g/dL   AST 42 (H) 15 - 41 U/L   ALT 36 0 - 44 U/L   Alkaline Phosphatase 44 38 - 126 U/L   Total Bilirubin 0.5 0.3 - 1.2 mg/dL   GFR, Estimated 53 (L) >60 mL/min   Anion gap 11 5 - 15  Urinalysis, Routine w reflex microscopic -Urine, Clean Catch  Result Value Ref Range   Color, Urine YELLOW (A) YELLOW   APPearance HAZY (A) CLEAR   Specific Gravity, Urine 1.024 1.005 - 1.030   pH 5.0 5.0 - 8.0   Glucose, UA >=500 (A) NEGATIVE mg/dL   Hgb urine dipstick NEGATIVE NEGATIVE   Bilirubin Urine NEGATIVE NEGATIVE   Ketones, ur NEGATIVE NEGATIVE mg/dL   Protein, ur NEGATIVE NEGATIVE mg/dL   Nitrite NEGATIVE NEGATIVE   Leukocytes,Ua NEGATIVE NEGATIVE   RBC / HPF 0-5 0 - 5 RBC/hpf   WBC, UA 0-5 0 - 5 WBC/hpf   Bacteria, UA NONE SEEN NONE SEEN   Squamous Epithelial / HPF 0-5 0 - 5 /HPF   Mucus PRESENT   CBG monitoring, ED  Result Value Ref Range   Glucose-Capillary 47 (L) 70 - 99 mg/dL  Troponin I (High Sensitivity)  Result Value Ref Range   Troponin I (High Sensitivity) 4 <18 ng/L  Troponin I (High Sensitivity)  Result Value Ref Range   Troponin I (High Sensitivity) 4 <18 ng/L      Assessment & Plan:   Problem List Items Addressed This Visit   None    Follow up plan: No follow-ups on file.

## 2022-11-02 ENCOUNTER — Encounter: Payer: Self-pay | Admitting: Family Medicine

## 2022-11-02 ENCOUNTER — Ambulatory Visit (INDEPENDENT_AMBULATORY_CARE_PROVIDER_SITE_OTHER): Payer: Medicare Other | Admitting: Nurse Practitioner

## 2022-11-02 ENCOUNTER — Encounter: Payer: Self-pay | Admitting: Nurse Practitioner

## 2022-11-02 ENCOUNTER — Other Ambulatory Visit: Payer: Self-pay

## 2022-11-02 VITALS — BP 128/72 | HR 76 | Temp 97.9°F | Resp 16 | Ht 59.0 in | Wt 190.0 lb

## 2022-11-02 DIAGNOSIS — R42 Dizziness and giddiness: Secondary | ICD-10-CM | POA: Diagnosis not present

## 2022-11-02 DIAGNOSIS — K5900 Constipation, unspecified: Secondary | ICD-10-CM

## 2022-11-02 DIAGNOSIS — E860 Dehydration: Secondary | ICD-10-CM

## 2022-11-02 DIAGNOSIS — G43009 Migraine without aura, not intractable, without status migrainosus: Secondary | ICD-10-CM

## 2022-11-02 MED ORDER — SUMATRIPTAN SUCCINATE 100 MG PO TABS: ORAL_TABLET | ORAL | 0 refills | Status: DC

## 2022-11-02 MED ORDER — MECLIZINE HCL 25 MG PO TABS: ORAL_TABLET | ORAL | 0 refills | Status: DC

## 2022-11-02 NOTE — Assessment & Plan Note (Signed)
Patient reports she is doing much better but would like a refill of her meclizine for her vertigo. Refill sent.

## 2022-11-02 NOTE — Assessment & Plan Note (Signed)
Refill of imitrex sent in

## 2022-11-08 ENCOUNTER — Telehealth: Payer: Self-pay

## 2022-11-08 DIAGNOSIS — I110 Hypertensive heart disease with heart failure: Secondary | ICD-10-CM | POA: Diagnosis not present

## 2022-11-08 DIAGNOSIS — E1151 Type 2 diabetes mellitus with diabetic peripheral angiopathy without gangrene: Secondary | ICD-10-CM | POA: Diagnosis not present

## 2022-11-08 DIAGNOSIS — F250 Schizoaffective disorder, bipolar type: Secondary | ICD-10-CM | POA: Diagnosis not present

## 2022-11-08 DIAGNOSIS — E1165 Type 2 diabetes mellitus with hyperglycemia: Secondary | ICD-10-CM | POA: Diagnosis not present

## 2022-11-08 DIAGNOSIS — N1831 Chronic kidney disease, stage 3a: Secondary | ICD-10-CM | POA: Diagnosis not present

## 2022-11-08 DIAGNOSIS — M4692 Unspecified inflammatory spondylopathy, cervical region: Secondary | ICD-10-CM | POA: Diagnosis not present

## 2022-11-08 DIAGNOSIS — I5020 Unspecified systolic (congestive) heart failure: Secondary | ICD-10-CM | POA: Diagnosis not present

## 2022-11-08 DIAGNOSIS — E440 Moderate protein-calorie malnutrition: Secondary | ICD-10-CM | POA: Diagnosis not present

## 2022-11-08 DIAGNOSIS — E1140 Type 2 diabetes mellitus with diabetic neuropathy, unspecified: Secondary | ICD-10-CM | POA: Diagnosis not present

## 2022-11-08 DIAGNOSIS — D631 Anemia in chronic kidney disease: Secondary | ICD-10-CM | POA: Diagnosis not present

## 2022-11-08 DIAGNOSIS — E1122 Type 2 diabetes mellitus with diabetic chronic kidney disease: Secondary | ICD-10-CM | POA: Diagnosis not present

## 2022-11-08 DIAGNOSIS — B9620 Unspecified Escherichia coli [E. coli] as the cause of diseases classified elsewhere: Secondary | ICD-10-CM | POA: Diagnosis not present

## 2022-11-08 DIAGNOSIS — I5032 Chronic diastolic (congestive) heart failure: Secondary | ICD-10-CM | POA: Diagnosis not present

## 2022-11-08 DIAGNOSIS — K861 Other chronic pancreatitis: Secondary | ICD-10-CM | POA: Diagnosis not present

## 2022-11-08 DIAGNOSIS — N10 Acute tubulo-interstitial nephritis: Secondary | ICD-10-CM | POA: Diagnosis not present

## 2022-11-08 NOTE — Telephone Encounter (Signed)
        Patient  visited [place] on 2/27     Telephone encounter attempt : 1st  A HIPAA compliant voice message was left requesting a return call.  Instructed patient to call back   [place] [facility] [phone] [address], [place], [place] [address] Phone: [phone] Email: [email or web address]

## 2022-11-09 ENCOUNTER — Telehealth: Payer: Self-pay

## 2022-11-09 DIAGNOSIS — I5020 Unspecified systolic (congestive) heart failure: Secondary | ICD-10-CM | POA: Diagnosis not present

## 2022-11-09 DIAGNOSIS — I110 Hypertensive heart disease with heart failure: Secondary | ICD-10-CM | POA: Diagnosis not present

## 2022-11-09 DIAGNOSIS — B9620 Unspecified Escherichia coli [E. coli] as the cause of diseases classified elsewhere: Secondary | ICD-10-CM | POA: Diagnosis not present

## 2022-11-09 DIAGNOSIS — I5032 Chronic diastolic (congestive) heart failure: Secondary | ICD-10-CM | POA: Diagnosis not present

## 2022-11-09 DIAGNOSIS — E1151 Type 2 diabetes mellitus with diabetic peripheral angiopathy without gangrene: Secondary | ICD-10-CM | POA: Diagnosis not present

## 2022-11-09 DIAGNOSIS — D631 Anemia in chronic kidney disease: Secondary | ICD-10-CM | POA: Diagnosis not present

## 2022-11-09 DIAGNOSIS — M4692 Unspecified inflammatory spondylopathy, cervical region: Secondary | ICD-10-CM | POA: Diagnosis not present

## 2022-11-09 DIAGNOSIS — E1122 Type 2 diabetes mellitus with diabetic chronic kidney disease: Secondary | ICD-10-CM | POA: Diagnosis not present

## 2022-11-09 DIAGNOSIS — E440 Moderate protein-calorie malnutrition: Secondary | ICD-10-CM | POA: Diagnosis not present

## 2022-11-09 DIAGNOSIS — E1140 Type 2 diabetes mellitus with diabetic neuropathy, unspecified: Secondary | ICD-10-CM | POA: Diagnosis not present

## 2022-11-09 DIAGNOSIS — N10 Acute tubulo-interstitial nephritis: Secondary | ICD-10-CM | POA: Diagnosis not present

## 2022-11-09 DIAGNOSIS — F250 Schizoaffective disorder, bipolar type: Secondary | ICD-10-CM | POA: Diagnosis not present

## 2022-11-09 DIAGNOSIS — N1831 Chronic kidney disease, stage 3a: Secondary | ICD-10-CM | POA: Diagnosis not present

## 2022-11-09 DIAGNOSIS — K861 Other chronic pancreatitis: Secondary | ICD-10-CM | POA: Diagnosis not present

## 2022-11-09 DIAGNOSIS — E1165 Type 2 diabetes mellitus with hyperglycemia: Secondary | ICD-10-CM | POA: Diagnosis not present

## 2022-11-09 NOTE — Telephone Encounter (Signed)
        Patient  visited [place] on 2/27    Telephone encounter attempt :  2nd  A HIPAA compliant voice message was left requesting a return call.  Instructed patient to call back     [facility] [phone] [address], [place], [place] [address] Phone: [phone] Email: [email or web address]

## 2022-11-14 DIAGNOSIS — I5020 Unspecified systolic (congestive) heart failure: Secondary | ICD-10-CM | POA: Diagnosis not present

## 2022-11-14 DIAGNOSIS — N1831 Chronic kidney disease, stage 3a: Secondary | ICD-10-CM | POA: Diagnosis not present

## 2022-11-14 DIAGNOSIS — E440 Moderate protein-calorie malnutrition: Secondary | ICD-10-CM | POA: Diagnosis not present

## 2022-11-14 DIAGNOSIS — E1122 Type 2 diabetes mellitus with diabetic chronic kidney disease: Secondary | ICD-10-CM | POA: Diagnosis not present

## 2022-11-14 DIAGNOSIS — K861 Other chronic pancreatitis: Secondary | ICD-10-CM | POA: Diagnosis not present

## 2022-11-14 DIAGNOSIS — N10 Acute tubulo-interstitial nephritis: Secondary | ICD-10-CM | POA: Diagnosis not present

## 2022-11-14 DIAGNOSIS — E1140 Type 2 diabetes mellitus with diabetic neuropathy, unspecified: Secondary | ICD-10-CM | POA: Diagnosis not present

## 2022-11-14 DIAGNOSIS — E1151 Type 2 diabetes mellitus with diabetic peripheral angiopathy without gangrene: Secondary | ICD-10-CM | POA: Diagnosis not present

## 2022-11-14 DIAGNOSIS — D631 Anemia in chronic kidney disease: Secondary | ICD-10-CM | POA: Diagnosis not present

## 2022-11-14 DIAGNOSIS — B9620 Unspecified Escherichia coli [E. coli] as the cause of diseases classified elsewhere: Secondary | ICD-10-CM | POA: Diagnosis not present

## 2022-11-14 DIAGNOSIS — E1165 Type 2 diabetes mellitus with hyperglycemia: Secondary | ICD-10-CM | POA: Diagnosis not present

## 2022-11-14 DIAGNOSIS — I110 Hypertensive heart disease with heart failure: Secondary | ICD-10-CM | POA: Diagnosis not present

## 2022-11-14 DIAGNOSIS — F250 Schizoaffective disorder, bipolar type: Secondary | ICD-10-CM | POA: Diagnosis not present

## 2022-11-14 DIAGNOSIS — M4692 Unspecified inflammatory spondylopathy, cervical region: Secondary | ICD-10-CM | POA: Diagnosis not present

## 2022-11-14 DIAGNOSIS — I5032 Chronic diastolic (congestive) heart failure: Secondary | ICD-10-CM | POA: Diagnosis not present

## 2022-11-16 NOTE — Progress Notes (Signed)
BP 132/84   Pulse 87   Temp 97.9 F (36.6 C) (Oral)   Resp 16   Ht 4\' 11"  (1.499 m)   Wt 189 lb 14.4 oz (86.1 kg)   BMI 38.36 kg/m    Subjective:    Patient ID: [name], female    DOB: 1966-06-21, 57 y.o.   MRN: [id number]  HPI: [name] is a 57 y.o. female  Chief Complaint  Patient presents with   Fall    Frequent falls   Nocturia   Frequent falls: patient reports she has had about 10 falls since January. She says she falls because she gets off balance. She denies any current injury.  She is currently getting physical therapy.  She says this is helping.  She is currently using a walker. She says she needs a walker with a seat. Will place DME order.   Urinary frequency/urgency/dysuria: she says symptoms started this week. She says she has had urinary frequency, urgency and dysuria.  She denies any fever, but reports slight lower back pain.  No CVA tenderness on exam. She says she feels like she has a uti and is concerned because she has had sepsis before.  She is reporting some nausea.  She says this is typical for her uti. Urine dip was positive for leukocytes. Will send urine for culture.  Start cipro. If any worsening symptoms seek emergency care.   Relevant past medical, surgical, family and social history reviewed and updated as indicated. Interim medical history since our last visit reviewed. Allergies and medications reviewed and updated.  Review of Systems  Constitutional: Negative for fever or weight change.  Respiratory: Negative for cough and shortness of breath.   Cardiovascular: Negative for chest pain or palpitations.  Gastrointestinal: Negative for abdominal pain, no bowel changes.  GU: positive for urinary frequency, urgency and dysuria Musculoskeletal: positive for gait problem or joint swelling.  Skin: Negative for rash.  Neurological: Negative for dizziness or headache.  No other specific complaints in a complete review of systems (except as  listed in HPI above).      Objective:    BP 132/84   Pulse 87   Temp 97.9 F (36.6 C) (Oral)   Resp 16   Ht 4\' 11"  (1.499 m)   Wt 189 lb 14.4 oz (86.1 kg)   BMI 38.36 kg/m   Wt Readings from Last 3 Encounters:  11/17/22 189 lb 14.4 oz (86.1 kg)  11/02/22 190 lb (86.2 kg)  10/31/22 177 lb (80.3 kg)    Physical Exam  Constitutional: Patient appears well-developed and well-nourished. Obese  No distress.  HEENT: head atraumatic, normocephalic, pupils equal and reactive to light, neck supple Cardiovascular: Normal rate, regular rhythm and normal heart sounds.  No murmur heard. No BLE edema. Pulmonary/Chest: Effort normal and breath sounds normal. No respiratory distress. Abdominal: Soft.  There is no tenderness. Psychiatric: Patient has a normal mood and affect. behavior is normal. Judgment and thought content normal.   Results for orders placed or performed in visit on 11/17/22  POCT urinalysis dipstick  Result Value Ref Range   Color, UA yellow    Clarity, UA clear    Glucose, UA Positive (A) Negative   Bilirubin, UA negative    Ketones, UA positive    Spec Grav, UA 1.015 1.010 - 1.025   Blood, UA negative    pH, UA 5.0 5.0 - 8.0   Protein, UA Positive (A) Negative   Urobilinogen, UA 0.2  0.2 or 1.0 E.U./dL   Nitrite, UA negative    Leukocytes, UA Small (1+) (A) Negative   Appearance clear    Odor none       Assessment & Plan:   Problem List Items Addressed This Visit   None Visit Diagnoses     Urinary frequency    -  Primary   urine dip, culture, labs ordered, start cipro, push fluids. if worsening symptoms seek emergency care   Relevant Medications   ciprofloxacin (CIPRO) 250 MG tablet   Other Relevant Orders   POCT urinalysis dipstick (Completed)   Urine Culture   Frequent falls       dme order placed for walker with seat   Relevant Orders   For home use only DME 4 wheeled rolling walker with seat [id number])   Nausea       urine dip, culture, labs  ordered, start cipro, push fluids. zofran for nausea.  if worsening symptoms seek emergency care   Relevant Medications   ondansetron (ZOFRAN-ODT) 4 MG disintegrating tablet   Other Relevant Orders   CBC with Differential/Platelet   COMPLETE METABOLIC PANEL WITH GFR        Follow up plan: Return in about 3 months (around 02/17/2023) for schedule appointment with sowles, follow up.

## 2022-11-17 ENCOUNTER — Ambulatory Visit (INDEPENDENT_AMBULATORY_CARE_PROVIDER_SITE_OTHER): Payer: Medicare Other | Admitting: Nurse Practitioner

## 2022-11-17 ENCOUNTER — Other Ambulatory Visit: Payer: Self-pay

## 2022-11-17 ENCOUNTER — Encounter: Payer: Self-pay | Admitting: Nurse Practitioner

## 2022-11-17 VITALS — BP 132/84 | HR 87 | Temp 97.9°F | Resp 16 | Ht 59.0 in | Wt 189.9 lb

## 2022-11-17 DIAGNOSIS — R110 Nausea: Secondary | ICD-10-CM

## 2022-11-17 DIAGNOSIS — R350 Frequency of micturition: Secondary | ICD-10-CM | POA: Diagnosis not present

## 2022-11-17 DIAGNOSIS — R296 Repeated falls: Secondary | ICD-10-CM | POA: Diagnosis not present

## 2022-11-17 LAB — POCT URINALYSIS DIPSTICK
Bilirubin, UA: NEGATIVE
Blood, UA: NEGATIVE
Glucose, UA: POSITIVE — AB
Ketones, UA: POSITIVE
Nitrite, UA: NEGATIVE
Protein, UA: POSITIVE — AB
Spec Grav, UA: 1.015 (ref 1.010–1.025)
Urobilinogen, UA: 0.2 E.U./dL
pH, UA: 5 (ref 5.0–8.0)

## 2022-11-17 MED ORDER — CIPROFLOXACIN HCL 250 MG PO TABS: 250.0000 mg | ORAL_TABLET | Freq: Two times a day (BID) | ORAL | 0 refills | Status: AC

## 2022-11-17 MED ORDER — ONDANSETRON 4 MG PO TBDP: 4.0000 mg | ORAL_TABLET | Freq: Three times a day (TID) | ORAL | 0 refills | Status: DC | PRN

## 2022-11-18 LAB — COMPLETE METABOLIC PANEL WITH GFR
AG Ratio: 1.8 (calc) (ref 1.0–2.5)
ALT: 29 U/L (ref 6–29)
AST: 33 U/L (ref 10–35)
Albumin: 4 g/dL (ref 3.6–5.1)
Alkaline phosphatase (APISO): 49 U/L (ref 37–153)
BUN/Creatinine Ratio: 20 (calc) (ref 6–22)
BUN: 26 mg/dL — ABNORMAL HIGH (ref 7–25)
CO2: 27 mmol/L (ref 20–32)
Calcium: 10.1 mg/dL (ref 8.6–10.4)
Chloride: 101 mmol/L (ref 98–110)
Creat: 1.28 mg/dL — ABNORMAL HIGH (ref 0.50–1.03)
Globulin: 2.2 g/dL (calc) (ref 1.9–3.7)
Glucose, Bld: 200 mg/dL — ABNORMAL HIGH (ref 65–99)
Potassium: 4.4 mmol/L (ref 3.5–5.3)
Sodium: 142 mmol/L (ref 135–146)
Total Bilirubin: 0.3 mg/dL (ref 0.2–1.2)
Total Protein: 6.2 g/dL (ref 6.1–8.1)
eGFR: 48 mL/min/{1.73_m2} — ABNORMAL LOW (ref >=60)

## 2022-11-18 LAB — CBC WITH DIFFERENTIAL/PLATELET
Absolute Monocytes: 455 cells/uL (ref 200–950)
Basophils Absolute: 20 cells/uL (ref 0–200)
Basophils Relative: 0.3 %
Eosinophils Absolute: 280 cells/uL (ref 15–500)
Eosinophils Relative: 4.3 %
HCT: 39.3 % (ref 35.0–45.0)
Hemoglobin: 12.3 g/dL (ref 11.7–15.5)
Lymphs Abs: 2015 cells/uL (ref 850–3900)
MCH: 28.7 pg (ref 27.0–33.0)
MCHC: 31.3 g/dL — ABNORMAL LOW (ref 32.0–36.0)
MCV: 91.6 fL (ref 80.0–100.0)
MPV: 9.9 fL (ref 7.5–12.5)
Monocytes Relative: 7 %
Neutro Abs: 3731 cells/uL (ref 1500–7800)
Neutrophils Relative %: 57.4 %
Platelets: 231 10*3/uL (ref 140–400)
RBC: 4.29 10*6/uL (ref 3.80–5.10)
RDW: 12.5 % (ref 11.0–15.0)
Total Lymphocyte: 31 %
WBC: 6.5 10*3/uL (ref 3.8–10.8)

## 2022-11-18 LAB — URINE CULTURE
MICRO NUMBER:: 14699792
SPECIMEN QUALITY:: ADEQUATE

## 2022-11-21 ENCOUNTER — Other Ambulatory Visit: Payer: Self-pay | Admitting: Gastroenterology

## 2022-11-24 ENCOUNTER — Emergency Department: Payer: Medicare Other

## 2022-11-24 ENCOUNTER — Emergency Department
Admission: EM | Admit: 2022-11-24 | Discharge: 2022-11-24 | Disposition: A | Discharge status: Home / Self Care | Payer: Medicare Other | Attending: Emergency Medicine | Admitting: Emergency Medicine

## 2022-11-24 ENCOUNTER — Other Ambulatory Visit: Payer: Self-pay

## 2022-11-24 DIAGNOSIS — N189 Chronic kidney disease, unspecified: Secondary | ICD-10-CM | POA: Diagnosis not present

## 2022-11-24 DIAGNOSIS — I509 Heart failure, unspecified: Secondary | ICD-10-CM | POA: Insufficient documentation

## 2022-11-24 DIAGNOSIS — R109 Unspecified abdominal pain: Secondary | ICD-10-CM | POA: Diagnosis not present

## 2022-11-24 DIAGNOSIS — M545 Low back pain, unspecified: Secondary | ICD-10-CM | POA: Diagnosis not present

## 2022-11-24 DIAGNOSIS — R0902 Hypoxemia: Secondary | ICD-10-CM | POA: Diagnosis not present

## 2022-11-24 DIAGNOSIS — J449 Chronic obstructive pulmonary disease, unspecified: Secondary | ICD-10-CM | POA: Insufficient documentation

## 2022-11-24 DIAGNOSIS — Z20822 Contact with and (suspected) exposure to covid-19: Secondary | ICD-10-CM | POA: Insufficient documentation

## 2022-11-24 DIAGNOSIS — N3090 Cystitis, unspecified without hematuria: Secondary | ICD-10-CM | POA: Insufficient documentation

## 2022-11-24 DIAGNOSIS — R0689 Other abnormalities of breathing: Secondary | ICD-10-CM | POA: Diagnosis not present

## 2022-11-24 DIAGNOSIS — R300 Dysuria: Secondary | ICD-10-CM | POA: Diagnosis not present

## 2022-11-24 LAB — BASIC METABOLIC PANEL
Anion gap: 11 (ref 5–15)
BUN: 26 mg/dL — ABNORMAL HIGH (ref 6–20)
CO2: 28 mmol/L (ref 22–32)
Calcium: 10.4 mg/dL — ABNORMAL HIGH (ref 8.9–10.3)
Chloride: 102 mmol/L (ref 98–111)
Creatinine, Ser: 1.06 mg/dL — ABNORMAL HIGH (ref 0.44–1.00)
GFR, Estimated: >60 mL/min (ref >60)
Glucose, Bld: 101 mg/dL — ABNORMAL HIGH (ref 70–99)
Potassium: 4 mmol/L (ref 3.5–5.1)
Sodium: 141 mmol/L (ref 135–145)

## 2022-11-24 LAB — RESP PANEL BY RT-PCR (RSV, FLU A&B, COVID)  RVPGX2
Influenza A by PCR: NEGATIVE
Influenza B by PCR: NEGATIVE
Resp Syncytial Virus by PCR: NEGATIVE
SARS Coronavirus 2 by RT PCR: NEGATIVE

## 2022-11-24 LAB — CBC
HCT: 42 % (ref 36.0–46.0)
Hemoglobin: 13.2 g/dL (ref 12.0–15.0)
MCH: 29.4 pg (ref 26.0–34.0)
MCHC: 31.4 g/dL (ref 30.0–36.0)
MCV: 93.5 fL (ref 80.0–100.0)
Platelets: 222 10*3/uL (ref 150–400)
RBC: 4.49 MIL/uL (ref 3.87–5.11)
RDW: 13.5 % (ref 11.5–15.5)
WBC: 6.5 10*3/uL (ref 4.0–10.5)
nRBC: 0 % (ref 0.0–0.2)

## 2022-11-24 LAB — URINALYSIS, ROUTINE W REFLEX MICROSCOPIC
Bilirubin Urine: NEGATIVE
Glucose, UA: >=500 mg/dL — AB
Hgb urine dipstick: NEGATIVE
Ketones, ur: NEGATIVE mg/dL
Nitrite: NEGATIVE
Protein, ur: NEGATIVE mg/dL
Specific Gravity, Urine: 1.027 (ref 1.005–1.030)
pH: 5 (ref 5.0–8.0)

## 2022-11-24 MED ORDER — KETOROLAC TROMETHAMINE 15 MG/ML IJ SOLN
15.0000 mg | Freq: Once | INTRAMUSCULAR | Status: AC
  Administered 2022-11-24: 15 mg via INTRAVENOUS
  Filled 2022-11-24: qty 1

## 2022-11-24 MED ORDER — SODIUM CHLORIDE 0.9 % IV BOLUS
1000.0000 mL | Freq: Once | INTRAVENOUS | Status: AC
  Administered 2022-11-24: 1000 mL via INTRAVENOUS

## 2022-11-24 MED ORDER — ONDANSETRON 4 MG PO TBDP: 4.0000 mg | ORAL_TABLET | Freq: Three times a day (TID) | ORAL | 0 refills | Status: DC | PRN

## 2022-11-24 MED ORDER — ONDANSETRON HCL 4 MG/2ML IJ SOLN
4.0000 mg | Freq: Once | INTRAMUSCULAR | Status: AC
  Administered 2022-11-24: 4 mg via INTRAVENOUS
  Filled 2022-11-24: qty 2

## 2022-11-24 MED ORDER — CEFDINIR 300 MG PO CAPS: 300.0000 mg | ORAL_CAPSULE | Freq: Two times a day (BID) | ORAL | 0 refills | Status: DC

## 2022-11-24 MED ORDER — SODIUM CHLORIDE 0.9 % IV SOLN
1.0000 g | Freq: Once | INTRAVENOUS | Status: AC
  Administered 2022-11-24: 1 g via INTRAVENOUS
  Filled 2022-11-24: qty 10

## 2022-11-24 NOTE — Discharge Instructions (Signed)
Your urine test today shows signs of a remaining bladder infection. We requested a culture be performed in the lab to verify that this will clear up with the omnicef antibiotic we prescribed today.

## 2022-11-24 NOTE — ED Triage Notes (Signed)
Pt presents to ED with c/o of having a UTI and taking all Rx'ed ABX and states it has got worse. Pt does endorse dysuria and "some retention. Pt state started ABX this past Friday.

## 2022-11-24 NOTE — ED Provider Notes (Signed)
[facility] Provider Note    Event Date/Time   First MD Initiated Contact with Patient 11/24/22 1353     (approximate)   History   Chief Complaint: Recurrent UTI   HPI  [name] is a 57 y.o. female with a past history of COPD, bipolar disorder, chronic headaches, GERD, CHF, CKD, restless leg syndrome who was recently treated a week ago with 3 days of ciprofloxacin who comes ED complaining of recurrent dysuria.  Also has some lower abdominal pain and low back pain.  No fever chills chest pain shortness of breath or dizziness.     Physical Exam   Triage Vital Signs: ED Triage Vitals [11/24/22 1327]  Enc Vitals Group     BP (!) 142/99     Pulse Rate 75     Resp 18     Temp 98.2 F (36.8 C)     Temp Source Oral     SpO2 96 %     Weight      Height      Head Circumference      Peak Flow      Pain Score 0     Pain Loc      Pain Edu?      Excl. in [place]?     Most recent vital signs: Vitals:   11/24/22 1744 11/24/22 1827  BP: (!) 127/56   Pulse: 62   Resp: 18   Temp:  98 F (36.7 C)  SpO2: 95%     General: Awake, no distress.  CV:  Good peripheral perfusion.  Regular rate rhythm Resp:  Normal effort.  Clear to auscultation bilaterally Abd:  No distention.  Soft with suprapubic tenderness.  No CVA tenderness. Other:  Moist oral mucosa.  Normal mental status   ED Results / Procedures / Treatments   Labs (all labs ordered are listed, but only abnormal results are displayed) Labs Reviewed  BASIC METABOLIC PANEL - Abnormal; Notable for the following components:      Result Value   Glucose, Bld 101 (*)    BUN 26 (*)    Creatinine, Ser 1.06 (*)    Calcium 10.4 (*)    All other components within normal limits  URINALYSIS, ROUTINE W REFLEX MICROSCOPIC - Abnormal; Notable for the following components:   Color, Urine YELLOW (*)    APPearance CLEAR (*)    Glucose, UA >=500 (*)    Leukocytes,Ua TRACE (*)    Bacteria, UA RARE (*)     All other components within normal limits  RESP PANEL BY RT-PCR (RSV, FLU A&B, COVID)  [id number]  URINE CULTURE  CBC     EKG    RADIOLOGY CT abdomen pelvis interpreted by me, no obstructing kidney stones.  Radiology report reviewed   PROCEDURES:  Procedures   MEDICATIONS ORDERED IN ED: Medications  sodium chloride 0.9 % bolus 1,000 mL (0 mLs Intravenous Stopped 11/24/22 1824)  ondansetron (ZOFRAN) injection 4 mg (4 mg Intravenous Given 11/24/22 1434)  ketorolac (TORADOL) 15 MG/ML injection 15 mg (15 mg Intravenous Given 11/24/22 1434)  cefTRIAXone (ROCEPHIN) 1 g in sodium chloride 0.9 % 100 mL IVPB (0 g Intravenous Stopped 11/24/22 1824)     IMPRESSION / MDM / ASSESSMENT AND PLAN / ED COURSE  I reviewed the triage vital signs and the nursing notes.  DDx: Cystitis, COVID/flu, AKI, electrolyte abnormality, anemia, dehydration, ureteral obstruction/kidney stone  Patient's presentation is most consistent with acute presentation with potential threat to  life or bodily function.  Patient presents with recurrent symptoms of cystitis after 3-day course of Cipro 1 week ago.  Vital signs are unremarkable, no signs of sepsis or pyelonephritis.  Lab workup was unremarkable, CT scan unremarkable.  Patient given IV fluids for hydration here, Toradol and Zofran for symptom relief.  Gave 1 g of Rocephin for initial UTI treatment and started on a course of cefdinir.  She will follow-up with her primary care doctor this week for continued monitoring of symptoms and reassessment.       FINAL CLINICAL IMPRESSION(S) / ED DIAGNOSES   Final diagnoses:  Cystitis     Rx / DC Orders   ED Discharge Orders          Ordered    cefdinir (OMNICEF) 300 MG capsule  2 times daily        11/24/22 1650    ondansetron (ZOFRAN-ODT) 4 MG disintegrating tablet  Every 8 hours PRN        11/24/22 1650             Note:  This document was prepared using Dragon voice recognition software and may  include unintentional dictation errors.   Carrie Mew, MD 11/24/22 [phone]

## 2022-11-24 NOTE — ED Notes (Signed)
Bedside commode placed per pt request.

## 2022-11-26 LAB — URINE CULTURE

## 2022-12-09 ENCOUNTER — Other Ambulatory Visit: Payer: Self-pay

## 2022-12-09 ENCOUNTER — Emergency Department
Admission: EM | Admit: 2022-12-09 | Discharge: 2022-12-09 | Disposition: A | Discharge status: Home / Self Care | Payer: Medicare Other | Attending: Emergency Medicine | Admitting: Emergency Medicine

## 2022-12-09 DIAGNOSIS — E1122 Type 2 diabetes mellitus with diabetic chronic kidney disease: Secondary | ICD-10-CM | POA: Insufficient documentation

## 2022-12-09 DIAGNOSIS — E1165 Type 2 diabetes mellitus with hyperglycemia: Secondary | ICD-10-CM | POA: Diagnosis not present

## 2022-12-09 DIAGNOSIS — N1831 Chronic kidney disease, stage 3a: Secondary | ICD-10-CM | POA: Diagnosis not present

## 2022-12-09 DIAGNOSIS — I5032 Chronic diastolic (congestive) heart failure: Secondary | ICD-10-CM | POA: Insufficient documentation

## 2022-12-09 DIAGNOSIS — R739 Hyperglycemia, unspecified: Secondary | ICD-10-CM

## 2022-12-09 DIAGNOSIS — I959 Hypotension, unspecified: Secondary | ICD-10-CM | POA: Diagnosis not present

## 2022-12-09 DIAGNOSIS — J449 Chronic obstructive pulmonary disease, unspecified: Secondary | ICD-10-CM | POA: Diagnosis not present

## 2022-12-09 DIAGNOSIS — I130 Hypertensive heart and chronic kidney disease with heart failure and stage 1 through stage 4 chronic kidney disease, or unspecified chronic kidney disease: Secondary | ICD-10-CM | POA: Diagnosis not present

## 2022-12-09 DIAGNOSIS — I2510 Atherosclerotic heart disease of native coronary artery without angina pectoris: Secondary | ICD-10-CM | POA: Diagnosis not present

## 2022-12-09 DIAGNOSIS — E039 Hypothyroidism, unspecified: Secondary | ICD-10-CM | POA: Diagnosis not present

## 2022-12-09 DIAGNOSIS — R300 Dysuria: Secondary | ICD-10-CM | POA: Insufficient documentation

## 2022-12-09 LAB — BASIC METABOLIC PANEL
Anion gap: 7 (ref 5–15)
BUN: 21 mg/dL — ABNORMAL HIGH (ref 6–20)
CO2: 26 mmol/L (ref 22–32)
Calcium: 8.2 mg/dL — ABNORMAL LOW (ref 8.9–10.3)
Chloride: 109 mmol/L (ref 98–111)
Creatinine, Ser: 1.11 mg/dL — ABNORMAL HIGH (ref 0.44–1.00)
GFR, Estimated: 57 mL/min — ABNORMAL LOW (ref >60)
Glucose, Bld: 123 mg/dL — ABNORMAL HIGH (ref 70–99)
Potassium: 3.7 mmol/L (ref 3.5–5.1)
Sodium: 142 mmol/L (ref 135–145)

## 2022-12-09 LAB — URINALYSIS, ROUTINE W REFLEX MICROSCOPIC
Bilirubin Urine: NEGATIVE
Glucose, UA: >=500 mg/dL — AB
Hgb urine dipstick: NEGATIVE
Ketones, ur: NEGATIVE mg/dL
Leukocytes,Ua: NEGATIVE
Nitrite: NEGATIVE
Protein, ur: NEGATIVE mg/dL
Specific Gravity, Urine: 1.028 (ref 1.005–1.030)
pH: 5 (ref 5.0–8.0)

## 2022-12-09 LAB — CBC WITH DIFFERENTIAL/PLATELET
Abs Immature Granulocytes: 0.19 10*3/uL — ABNORMAL HIGH (ref 0.00–0.07)
Basophils Absolute: 0.1 10*3/uL (ref 0.0–0.1)
Basophils Relative: 1 %
Eosinophils Absolute: 0.3 10*3/uL (ref 0.0–0.5)
Eosinophils Relative: 4 %
HCT: 38.1 % (ref 36.0–46.0)
Hemoglobin: 12 g/dL (ref 12.0–15.0)
Immature Granulocytes: 2 %
Lymphocytes Relative: 32 %
Lymphs Abs: 2.6 10*3/uL (ref 0.7–4.0)
MCH: 28.9 pg (ref 26.0–34.0)
MCHC: 31.5 g/dL (ref 30.0–36.0)
MCV: 91.8 fL (ref 80.0–100.0)
Monocytes Absolute: 0.7 10*3/uL (ref 0.1–1.0)
Monocytes Relative: 8 %
Neutro Abs: 4.4 10*3/uL (ref 1.7–7.7)
Neutrophils Relative %: 53 %
Platelets: 222 10*3/uL (ref 150–400)
RBC: 4.15 MIL/uL (ref 3.87–5.11)
RDW: 13 % (ref 11.5–15.5)
WBC: 8.1 10*3/uL (ref 4.0–10.5)
nRBC: 0 % (ref 0.0–0.2)

## 2022-12-09 LAB — CBG MONITORING, ED: Glucose-Capillary: 289 mg/dL — ABNORMAL HIGH (ref 70–99)

## 2022-12-09 MED ORDER — SODIUM CHLORIDE 0.9 % IV BOLUS
1000.0000 mL | Freq: Once | INTRAVENOUS | Status: AC
  Administered 2022-12-09: 1000 mL via INTRAVENOUS

## 2022-12-09 NOTE — ED Triage Notes (Signed)
CBG 340 per EMS 

## 2022-12-09 NOTE — ED Notes (Signed)
Labs sent

## 2022-12-09 NOTE — ED Triage Notes (Signed)
Pt c/o weakness, nausea, and pain with urination. Pt reports history of UTI's. Pt denies blood in urine.

## 2022-12-09 NOTE — ED Triage Notes (Signed)
First nurse note: Pt from home c/o possible UTI. Reports hx of frequent UTIs. EMS report afebrile.

## 2022-12-09 NOTE — Discharge Instructions (Signed)
Your blood sugar was elevated, and you had significant amount of glucose in your urine which was likely contributing to your discomfort with urinating and urinary urgency.  You were given fluids which brought down your blood sugar.  Patient noted hypotension your blood sugar at home.  Please return for any new, worsening, or change in symptoms or other concerns.  Please follow-up your outpatient provider this week.  It was a pleasure caring for you today.

## 2022-12-09 NOTE — ED Provider Notes (Signed)
[facility] Provider Note    Event Date/Time   First MD Initiated Contact with Patient 12/09/22 1308     (approximate)   History   Dysuria   HPI  [name] is a 57 y.o. female who presents today for evaluation of burning with urination and urinary frequency that began yesterday.  Patient reports that she has had multiple urinary tract infections in the past and is concerned that this is happening to her today.  She reports that she has not had a fever or chills.  No abdominal pain or flank pain.  Patient Active Problem List   Diagnosis Date Noted   Bacteremia due to Escherichia coli 09/13/2022   Encephalopathy acute 09/13/2022   Severe sepsis 09/10/2022   Vertigo 02/10/2022   Abnormal urinalysis 02/10/2022   TIA (transient ischemic attack) 10/04/2021   HTN (hypertension) 10/04/2021   Chronic kidney disease, stage 3a 10/04/2021   Depression with anxiety 10/04/2021   COPD (chronic obstructive pulmonary disease) 10/04/2021   CAD (coronary artery disease) 10/04/2021   Chronic diastolic CHF (congestive heart failure) 10/04/2021   Type II diabetes mellitus with renal manifestations 06/30/2021   Interstitial cystitis 06/30/2021   Atherosclerosis of aorta 03/28/2021   Iron deficiency anemia due to chronic blood loss    Polyp of ascending colon    Vitamin B12 deficiency 08/08/2018   Patient is full code 10/30/2017   Coronary artery disease of native artery of native heart with stable angina pectoris 10/09/2017   Plantar fascial fibromatosis 10/09/2017   Tendinitis of wrist 10/09/2017   Schizoaffective disorder, bipolar type 09/28/2017   Adenomatous colon polyp 10/15/2015   Bipolar affective disorder 10/15/2015   CN (constipation) 10/15/2015   HLD (hyperlipidemia) 10/15/2015   NASH (nonalcoholic steatohepatitis) 10/15/2015   Obesity (BMI 35.0-39.9 without comorbidity) 10/15/2015   Restless leg syndrome 10/15/2015   Low back pain with left-sided  sciatica 08/18/2015   Apnea, sleep 05/05/2015   Heart valve disease 05/05/2015   Complicated UTI (urinary tract infection) 03/24/2015   CKD stage 3 due to type 2 diabetes mellitus 03/15/2015   Migraine 03/12/2015   Chronic bronchitis 01/20/2015   Selective deficiency of IgG 01/20/2015   GERD (gastroesophageal reflux disease) 01/20/2015   Hypothyroid    Vitamin D deficiency    Urinary incontinence    Liver lesion    Hypomagnesemia    Obstructive sleep apnea 10/20/2013   Allergic rhinitis 07/04/2007          Physical Exam   Triage Vital Signs: ED Triage Vitals  Enc Vitals Group     BP 12/09/22 1246 115/68     Pulse Rate 12/09/22 1246 97     Resp 12/09/22 1246 19     Temp 12/09/22 1246 98.4 F (36.9 C)     Temp Source 12/09/22 1246 Oral     SpO2 12/09/22 1246 94 %     Weight --      Height --      Head Circumference --      Peak Flow --      Pain Score 12/09/22 1247 2     Pain Loc --      Pain Edu? --      Excl. in [place]? --     Most recent vital signs: Vitals:   12/09/22 1246  BP: 115/68  Pulse: 97  Resp: 19  Temp: 98.4 F (36.9 C)  SpO2: 94%    Physical Exam Vitals and nursing note reviewed.  Constitutional:      General: Awake and alert. No acute distress.    Appearance: Normal appearance. The patient is overweight.  HENT:     Head: Normocephalic and atraumatic.     Mouth: Mucous membranes are moist.  Eyes:     General: PERRL. Normal EOMs        Right eye: No discharge.        Left eye: No discharge.     Conjunctiva/sclera: Conjunctivae normal.  Cardiovascular:     Rate and Rhythm: Normal rate and regular rhythm.     Pulses: Normal pulses.  Pulmonary:     Effort: Pulmonary effort is normal. No respiratory distress.     Breath sounds: Normal breath sounds.  Abdominal:     Abdomen is soft. There is no abdominal tenderness. No rebound or guarding. No distention.  No CVA tenderness. Musculoskeletal:        General: No swelling. Normal range of  motion.     Cervical back: Normal range of motion and neck supple.  Skin:    General: Skin is warm and dry.     Capillary Refill: Capillary refill takes less than 2 seconds.     Findings: No rash.  Neurological:     Mental Status: The patient is awake and alert.      ED Results / Procedures / Treatments   Labs (all labs ordered are listed, but only abnormal results are displayed) Labs Reviewed  URINALYSIS, ROUTINE W REFLEX MICROSCOPIC - Abnormal; Notable for the following components:      Result Value   Color, Urine STRAW (*)    APPearance CLEAR (*)    Glucose, UA >=500 (*)    Bacteria, UA RARE (*)    All other components within normal limits  BASIC METABOLIC PANEL - Abnormal; Notable for the following components:   Glucose, Bld 123 (*)    BUN 21 (*)    Creatinine, Ser 1.11 (*)    Calcium 8.2 (*)    GFR, Estimated 57 (*)    All other components within normal limits  CBC WITH DIFFERENTIAL/PLATELET - Abnormal; Notable for the following components:   Abs Immature Granulocytes 0.19 (*)    All other components within normal limits  CBG MONITORING, ED - Abnormal; Notable for the following components:   Glucose-Capillary 289 (*)    All other components within normal limits     EKG     RADIOLOGY     PROCEDURES:  Critical Care performed:   Procedures   MEDICATIONS ORDERED IN ED: Medications  sodium chloride 0.9 % bolus 1,000 mL (0 mLs Intravenous Stopped 12/09/22 1549)     IMPRESSION / MDM / ASSESSMENT AND PLAN / ED COURSE  I reviewed the triage vital signs and the nursing notes.   Differential diagnosis includes, but is not limited to, UTI, pyelonephritis, glucosuria, hyperglycemia, DKA.  Patient is awake and alert, hemodynamically stable and afebrile.  Urinalysis obtained demonstrates greater than 500 glucose.  No leukocytes or nitrites to suggest urinary tract infection.  No flank pain or fever to suggest pyelonephritis.  No abdominal pain.  Labs obtained  after 1 L of normal saline was given because initial set drawn by RN was lost.  After normal saline bolus provided, glucose had improved to 123.  There is no increased anion gap, normal bicarb, not consistent with DKA.  Patient reports that she feels significantly improved after this.  I suspect that her symptoms were due to hyperglycemia and glucose urea.  We discussed the importance of paying very close attention to her glucose, and she agrees to do so.  Recommended close outpatient follow-up with her PCP and strict return precautions.  Patient understands and agrees with plan.  She was discharged in stable condition.   Patient's presentation is most consistent with acute complicated illness / injury requiring diagnostic workup.   FINAL CLINICAL IMPRESSION(S) / ED DIAGNOSES   Final diagnoses:  Hyperglycemia     Rx / DC Orders   ED Discharge Orders     None        Note:  This document was prepared using Dragon voice recognition software and may include unintentional dictation errors.   Jackelyn Hoehnoggi, Mayline Dragon E, PA-C 12/09/22 1727    Merwyn KatosBradler, Evan K, MD 12/09/22 [id number]

## 2022-12-12 DIAGNOSIS — F411 Generalized anxiety disorder: Secondary | ICD-10-CM | POA: Diagnosis not present

## 2022-12-12 DIAGNOSIS — F250 Schizoaffective disorder, bipolar type: Secondary | ICD-10-CM | POA: Diagnosis not present

## 2022-12-13 ENCOUNTER — Other Ambulatory Visit: Payer: Self-pay | Admitting: Family Medicine

## 2022-12-13 DIAGNOSIS — J3489 Other specified disorders of nose and nasal sinuses: Secondary | ICD-10-CM

## 2022-12-19 ENCOUNTER — Other Ambulatory Visit: Payer: Self-pay | Admitting: Nurse Practitioner

## 2022-12-19 DIAGNOSIS — R42 Dizziness and giddiness: Secondary | ICD-10-CM

## 2022-12-20 ENCOUNTER — Other Ambulatory Visit: Payer: Self-pay

## 2022-12-20 DIAGNOSIS — R42 Dizziness and giddiness: Secondary | ICD-10-CM

## 2022-12-20 MED ORDER — MECLIZINE HCL 25 MG PO TABS: ORAL_TABLET | ORAL | 0 refills | Status: DC

## 2022-12-20 NOTE — Telephone Encounter (Signed)
Requested medication (s) are due for refill today: yes  Requested medication (s) are on the active medication list: yes  Last refill:  11/02/22  Future visit scheduled: yes  Notes to clinic:  Unable to refill per protocol, cannot delegate.      Requested Prescriptions  Pending Prescriptions Disp Refills   meclizine (ANTIVERT) 25 MG tablet [Pharmacy Med Name: MECLIZINE HCL 25 MG TAB] 10 tablet 0    Sig: TAKE 1 TABLET BY MOUTH 3 TIMES DAILY AS NEEDED FOR DIZZINESS OR NAUSEA     Not Delegated - Gastroenterology: Antiemetics Failed - 12/19/2022  2:47 PM      Failed - This refill cannot be delegated      Passed - Valid encounter within last 6 months    Recent Outpatient Visits           1 month ago Urinary frequency   [facility] Berniece Salines, FNP   1 month ago Vertigo   [facility] Berniece Salines, FNP   2 months ago Dysuria   [facility] Alba Cory, MD   2 months ago Hospital discharge follow-up   [facility] Alba Cory, MD   7 months ago Vertigo   [facility] Alba Cory, MD       Future Appointments             In 1 month Midge Minium, MD [facility] [place]   In 2 months Alba Cory, MD [facility], [facility]

## 2022-12-28 ENCOUNTER — Ambulatory Visit: Payer: Medicare Other | Admitting: Gastroenterology

## 2023-01-16 ENCOUNTER — Other Ambulatory Visit: Payer: Self-pay | Admitting: Nurse Practitioner

## 2023-01-16 DIAGNOSIS — G43009 Migraine without aura, not intractable, without status migrainosus: Secondary | ICD-10-CM

## 2023-01-16 NOTE — Telephone Encounter (Signed)
Requested Prescriptions  Pending Prescriptions Disp Refills   SUMAtriptan (IMITREX) 100 MG tablet [Pharmacy Med Name: SUMATRIPTAN SUCCINATE 100 MG TAB] 10 tablet 0    Sig: TAKE 1 TABLET AT ONSET OF MIGRAINE. MAY REPEAT IN 2 HOURS IF HEADACHE PERSISTS OR RECURS.     Neurology:  Migraine Therapy - Triptan Passed - 01/16/2023  2:32 PM      Passed - Last BP in normal range    BP Readings from Last 1 Encounters:  12/09/22 115/68         Passed - Valid encounter within last 12 months    Recent Outpatient Visits           2 months ago Urinary frequency   [facility] Berniece Salines, FNP   2 months ago Vertigo   [facility] Berniece Salines, FNP   3 months ago Dysuria   [facility] Alba Cory, MD   3 months ago Hospital discharge follow-up   [facility] Alba Cory, MD   8 months ago Vertigo   [facility] Alba Cory, MD       Future Appointments             In 1 week Midge Minium, MD [facility] [place]   In 1 month Alba Cory, MD [facility]

## 2023-01-23 ENCOUNTER — Other Ambulatory Visit: Payer: Self-pay

## 2023-01-23 ENCOUNTER — Emergency Department
Admission: EM | Admit: 2023-01-23 | Discharge: 2023-01-23 | Disposition: A | Discharge status: Home / Self Care | Payer: Medicare Other | Attending: Emergency Medicine | Admitting: Emergency Medicine

## 2023-01-23 ENCOUNTER — Emergency Department: Payer: Medicare Other

## 2023-01-23 DIAGNOSIS — E119 Type 2 diabetes mellitus without complications: Secondary | ICD-10-CM | POA: Insufficient documentation

## 2023-01-23 DIAGNOSIS — N390 Urinary tract infection, site not specified: Secondary | ICD-10-CM | POA: Insufficient documentation

## 2023-01-23 DIAGNOSIS — I10 Essential (primary) hypertension: Secondary | ICD-10-CM | POA: Diagnosis not present

## 2023-01-23 DIAGNOSIS — R109 Unspecified abdominal pain: Secondary | ICD-10-CM | POA: Diagnosis not present

## 2023-01-23 DIAGNOSIS — R300 Dysuria: Secondary | ICD-10-CM | POA: Diagnosis not present

## 2023-01-23 DIAGNOSIS — K5900 Constipation, unspecified: Secondary | ICD-10-CM | POA: Insufficient documentation

## 2023-01-23 LAB — COMPREHENSIVE METABOLIC PANEL
ALT: 41 U/L (ref 0–44)
AST: 48 U/L — ABNORMAL HIGH (ref 15–41)
Albumin: 3.9 g/dL (ref 3.5–5.0)
Alkaline Phosphatase: 44 U/L (ref 38–126)
Anion gap: 10 (ref 5–15)
BUN: 27 mg/dL — ABNORMAL HIGH (ref 6–20)
CO2: 27 mmol/L (ref 22–32)
Calcium: 9.7 mg/dL (ref 8.9–10.3)
Chloride: 104 mmol/L (ref 98–111)
Creatinine, Ser: 1.04 mg/dL — ABNORMAL HIGH (ref 0.44–1.00)
GFR, Estimated: >60 mL/min (ref >60)
Glucose, Bld: 106 mg/dL — ABNORMAL HIGH (ref 70–99)
Potassium: 4.1 mmol/L (ref 3.5–5.1)
Sodium: 141 mmol/L (ref 135–145)
Total Bilirubin: 0.5 mg/dL (ref 0.3–1.2)
Total Protein: 6.4 g/dL — ABNORMAL LOW (ref 6.5–8.1)

## 2023-01-23 LAB — CBC
HCT: 42 % (ref 36.0–46.0)
Hemoglobin: 13.3 g/dL (ref 12.0–15.0)
MCH: 29.1 pg (ref 26.0–34.0)
MCHC: 31.7 g/dL (ref 30.0–36.0)
MCV: 91.9 fL (ref 80.0–100.0)
Platelets: 221 10*3/uL (ref 150–400)
RBC: 4.57 MIL/uL (ref 3.87–5.11)
RDW: 14.1 % (ref 11.5–15.5)
WBC: 6.2 10*3/uL (ref 4.0–10.5)
nRBC: 0 % (ref 0.0–0.2)

## 2023-01-23 LAB — URINALYSIS, ROUTINE W REFLEX MICROSCOPIC
Bacteria, UA: NONE SEEN
Bilirubin Urine: NEGATIVE
Glucose, UA: >=500 mg/dL — AB
Hgb urine dipstick: NEGATIVE
Ketones, ur: NEGATIVE mg/dL
Leukocytes,Ua: NEGATIVE
Nitrite: NEGATIVE
Protein, ur: NEGATIVE mg/dL
Specific Gravity, Urine: 1.022 (ref 1.005–1.030)
pH: 5 (ref 5.0–8.0)

## 2023-01-23 LAB — LIPASE, BLOOD: Lipase: 50 U/L (ref 11–51)

## 2023-01-23 MED ORDER — KETOROLAC TROMETHAMINE 30 MG/ML IJ SOLN: 15.0000 mg | Freq: Once | INTRAMUSCULAR | Status: DC

## 2023-01-23 MED ORDER — SODIUM CHLORIDE 0.9 % IV SOLN: 2.0000 g | Freq: Once | INTRAVENOUS | Status: DC

## 2023-01-23 MED ORDER — CEFDINIR 300 MG PO CAPS: 300.0000 mg | ORAL_CAPSULE | Freq: Two times a day (BID) | ORAL | 0 refills | Status: AC

## 2023-01-23 MED ORDER — ONDANSETRON 4 MG PO TBDP: 4.0000 mg | ORAL_TABLET | Freq: Three times a day (TID) | ORAL | 0 refills | Status: DC | PRN

## 2023-01-23 NOTE — ED Provider Notes (Signed)
[facility] Provider Note    Event Date/Time   First MD Initiated Contact with Patient 01/23/23 1400     (approximate)   History   Flank Pain and Dizziness   HPI  [name] is a 57 y.o. female with history of hypertension, hyperlipidemia, diabetes, anxiety, recurrent UTIs, here with dysuria and flank pain.  The patient states that for the last week, she has had progressively worsening burning with urination and mild suprapubic pressure.  She states that this has been progressively worsening and she has now had some mild bilateral flank pain.  She has a history of UTIs and sepsis with similar symptoms.  She thought she would come in to be seen before it got "so bad."  Denies any vomiting.  Denies any known fevers.  She has not taken anything.     Physical Exam   Triage Vital Signs: ED Triage Vitals  Enc Vitals Group     BP 01/23/23 1319 135/85     Pulse Rate 01/23/23 1319 72     Resp 01/23/23 1319 18     Temp 01/23/23 1319 98.5 F (36.9 C)     Temp Source 01/23/23 1319 Oral     SpO2 01/23/23 1319 100 %     Weight 01/23/23 1318 189 lb 13.1 oz (86.1 kg)     Height 01/23/23 1318 4\' 11"  (1.499 m)     Head Circumference --      Peak Flow --      Pain Score 01/23/23 1318 7     Pain Loc --      Pain Edu? --      Excl. in GC? --     Most recent vital signs: Vitals:   01/23/23 1319  BP: 135/85  Pulse: 72  Resp: 18  Temp: 98.5 F (36.9 C)  SpO2: 100%     General: Awake, no distress.  CV:  Good peripheral perfusion.  Resp:  Normal work of breathing.  Abd:  No distention.  Mild right CVA tenderness.  No rebound or guarding.  Abdomen is otherwise soft. Other:  Moist mucous membranes, nontoxic.  Alert and oriented x 4.   ED Results / Procedures / Treatments   Labs (all labs ordered are listed, but only abnormal results are displayed) Labs Reviewed  COMPREHENSIVE METABOLIC PANEL - Abnormal; Notable for the following components:       Result Value   Glucose, Bld 106 (*)    BUN 27 (*)    Creatinine, Ser 1.04 (*)    Total Protein 6.4 (*)    AST 48 (*)    All other components within normal limits  URINALYSIS, ROUTINE W REFLEX MICROSCOPIC - Abnormal; Notable for the following components:   Color, Urine YELLOW (*)    APPearance CLEAR (*)    Glucose, UA >=500 (*)    All other components within normal limits  URINE CULTURE  LIPASE, BLOOD  CBC     EKG Normal sinus rhythm, ventricular rate 69.  PR 176, QRS 80, QTc 4 7.  No acute ST elevations or depressions.   RADIOLOGY CT stone: Pending   I also independently reviewed and agree with radiologist interpretations.   PROCEDURES:  Critical Care performed: No   MEDICATIONS ORDERED IN ED: Medications  cefTRIAXone (ROCEPHIN) 2 g in sodium chloride 0.9 % 100 mL IVPB (has no administration in time range)  ketorolac (TORADOL) 30 MG/ML injection 15 mg (has no administration in time range)  IMPRESSION / MDM / ASSESSMENT AND PLAN / ED COURSE  I reviewed the triage vital signs and the nursing notes.                              Differential diagnosis includes, but is not limited to, UTI, pyelonephritis, colitis, diverticulitis, enteritis  Patient's presentation is most consistent with acute presentation with potential threat to life or bodily function.  The patient is on the cardiac monitor to evaluate for evidence of arrhythmia and/or significant heart rate changes  57 yo F here with flank pain, dysuria. Suspect UTI with possible early pyelo. Pt has no fever, tachycardia, or signs of sepsis clinically. Initial lab work is overall reassuring with no leukocytosis, no anemia.CMP unremarkable with overall unremarkable renal function, LFTs, and bili. Lipase wnl. UA shows mild pyuria - will err on the side of caution and treat given her history. Will plan to f/u CT given degree of flank pain and tenderness, to eval for stone or other etiology. Suspect she can be managed  as outpt if no stone or complicating features identified.  Clinical Course as of 01/23/23 1942  Tue Jan 23, 2023  1518 Past medical history significant for sepsis secondary to pyelonephritis and presents to the emergency department with flank pain and dysuria.  No significant leukocytosis.  Creatinine at baseline with no significant electrolyte abnormalities.  Urine with WBC but negative nitrites and leukocyte esterase.  Given IV Rocephin to treat for possible urinary tract infection.  Currently waiting for CT scan without contrast to evaluate for possible kidney stone.  Urine culture was obtained.  Plan for outpatient antibiotics [SM]  1549 Patient has not yet received the IV antibiotic and does not have an IV.  Patient without nausea or vomiting.  Able to tolerate p.o.  Feel that it would be okay for the patient to start her first oral antibiotic today, prescription was called in and patient states that she would be able to fill and pick up today. [SM]    Clinical Course User Index [SM] Corena Herter, MD     FINAL CLINICAL IMPRESSION(S) / ED DIAGNOSES   Final diagnoses:  Lower urinary tract infection  Flank pain  Constipation, unspecified constipation type     Rx / DC Orders   ED Discharge Orders          Ordered    cefdinir (OMNICEF) 300 MG capsule  2 times daily        01/23/23 1512    ondansetron (ZOFRAN-ODT) 4 MG disintegrating tablet  Every 8 hours PRN        01/23/23 1512             Note:  This document was prepared using Dragon voice recognition software and may include unintentional dictation errors.   Shaune Pollack, MD 01/23/23 [phone]

## 2023-01-23 NOTE — ED Provider Notes (Addendum)
Care assumed of patient from outgoing provider.  See their note for initial history, exam and plan.  Clinical Course as of 01/23/23 1550  Tue Jan 23, 2023  1518 Past medical history significant for sepsis secondary to pyelonephritis and presents to the emergency department with flank pain and dysuria.  No significant leukocytosis.  Creatinine at baseline with no significant electrolyte abnormalities.  Urine with WBC but negative nitrites and leukocyte esterase.  Given IV Rocephin to treat for possible urinary tract infection.  Currently waiting for CT scan without contrast to evaluate for possible kidney stone.  Urine culture was obtained.  Plan for outpatient antibiotics [SM]  1549 Patient has not yet received the IV antibiotic and does not have an IV.  Patient without nausea or vomiting.  Able to tolerate p.o.  Feel that it would be okay for the patient to start her first oral antibiotic today, prescription was called in and patient states that she would be able to fill and pick up today. [SM]    Clinical Course User Index [SM] Corena Herter, MD   CT scan without signs of kidney stones.  Findings of constipation.  Discussed treatment of constipation and antibiotics for possible urinary tract infection.   Corena Herter, MD 01/23/23 1534    Corena Herter, MD 01/23/23 1550

## 2023-01-23 NOTE — ED Triage Notes (Signed)
Pt here with right side flank pain and dizziness x2 weeks. Pt states she has a hx of becoming septic when she has a UTI. Pt having urinary frequency and burns when she urinates. Pt endorsing nausea.

## 2023-01-23 NOTE — Progress Notes (Signed)
[facility]ST consult received to obtain IV access. Upon arrival at bedside, patient stated the physician told her she was giving her an antibiotic and she would be going home. She stated she has been stuck 4 times for an IV already, all unsuccessful. Spoke with Mumma, MD. She stated patient does not need IV at this time.

## 2023-01-23 NOTE — Discharge Instructions (Addendum)
You were seen in the emergency department and had a CT scan done that did not show any signs of kidney stones.  Did show signs of constipation.  You had findings of a possible urinary tract infection and was given your first dose of antibiotics in the emergency department.  You are given a prescription for antibiotics and nausea medicine.  Constipation management - take MiraLAX (4 capfulls) mixed with 32-64 ounces of fluid.  Drink this entire drink.  If you do not have a bowel movement that day you can repeat the next day and add 2 capfuls for a total of 6 capfuls of MiraLAX.  MiraLAX does not work if you do not drink plenty of fluids with it. Once you have a good bowel movement, take 1 capfull of Miralax daily until having regular bowel movements.

## 2023-01-25 ENCOUNTER — Ambulatory Visit: Payer: Medicare Other | Admitting: Gastroenterology

## 2023-01-25 LAB — URINE CULTURE: Culture: <10000 — AB

## 2023-01-25 NOTE — Progress Notes (Deleted)
Primary Care Physician: Alba Cory, MD  Primary Gastroenterologist:  Dr. Midge Minium  No chief complaint on file.   HPI: [name] is a 57 y.o. female here for follow-up with history of GERD and constipation.  The patient was seen by me last year and was having nausea that was lasting a few weeks.  The patient had a colonoscopy in 2020 with 2 adenomatous polyps removed.  The patient was recommended to have a repeat colonoscopy in 5 years.  The patient was started on the 290 mcg of Linzess at the last visit.  Past Medical History:  Diagnosis Date   Anxiety    Arthritis    Asthma    Bipolar depression (HCC)    CHF (congestive heart failure) (HCC)    Chronic headaches    migraines have lessened   Chronic kidney disease    ddstage 111   COPD (chronic obstructive pulmonary disease) (HCC)    Coronary artery disease    Depression    Diabetic neuropathy (HCC)    Dyspnea    uses inhalers for this   Dysrhythmia    rapid   Fatty liver    GERD (gastroesophageal reflux disease)    Headache    Heart attack (HCC) 2009   Hyperlipidemia    Hypertension    Hypomagnesemia    Hypothyroid    Irregular heart rhythm    Liver lesion    Favored to be benign on CT; MRI of abd w/contract in Aug 2016. Liver Biopsy-Negative, March 2016   Morbid obesity (HCC)    OSA (obstructive sleep apnea)    Peripheral vascular disease (HCC)    Pinched nerve    Back   Pneumonia    RLS (restless legs syndrome)    Seasonal allergies    Sleep apnea    uses oxygen at night 2L d/t cpap did not fit   Type 2 diabetes, uncontrolled, with neuropathy 10/15/2015   Overview:  Microalbumin 7.7 12/2010: Hgb A1c 10.2% 12/2010 and 9.7% 03/2011, eye exam 10/2009 [facility] DM clinic    Urinary incontinence    Vitamin D deficiency     Current Outpatient Medications  Medication Sig Dispense Refill   atorvastatin (LIPITOR) 80 MG tablet Take 80 mg by mouth at bedtime.      busPIRone (BUSPAR) 30 MG tablet Take 30 mg by  mouth 2 (two) times daily.     cefdinir (OMNICEF) 300 MG capsule Take 1 capsule (300 mg total) by mouth 2 (two) times daily for 7 days. 14 capsule 0   citalopram (CELEXA) 20 MG tablet Take 20 mg by mouth daily.     esomeprazole (NEXIUM) 40 MG capsule TAKE 1 CAPSULE BY MOUTH IN THE MORNING 30 capsule 2   ezetimibe (ZETIA) 10 MG tablet Take 10 mg by mouth at bedtime.     fenofibrate micronized (LOFIBRA) 134 MG capsule Take 134 mg by mouth at bedtime.     fluticasone (FLONASE) 50 MCG/ACT nasal spray PLACE 2 SPRAYS INTO BOTH NOSTRILS DAILY 16 g 0   gabapentin (NEURONTIN) 300 MG capsule Take 300 mg by mouth 2 (two) times daily.     gabapentin (NEURONTIN) 600 MG tablet Take 600 mg by mouth at bedtime.     GLYXAMBI 25-5 MG TABS Take 1 tablet by mouth daily.     HUMALOG KWIKPEN 100 UNIT/ML KwikPen Inject into the skin.     HUMULIN R U-500 KWIKPEN 500 UNIT/ML kwikpen Inject 70 Units into the skin 3 (three)  times daily with meals.     hydrOXYzine (ATARAX) 50 MG tablet Take 50 mg by mouth 3 (three) times daily.     Insulin Aspart FlexPen (NOVOLOG) 100 UNIT/ML Inject into the skin.     Insulin Pen Needle (PEN NEEDLES) 31G X 8 MM MISC      lamoTRIgine (LAMICTAL) 200 MG tablet Take 200 mg by mouth 2 (two) times daily.      linaclotide (LINZESS) 145 MCG CAPS capsule TAKE 1 CAPSULE BY MOUTH DAILY BEFORE BREAKFAST (Patient taking differently: Take 145 mcg by mouth daily before breakfast.) 30 capsule 3   meclizine (ANTIVERT) 25 MG tablet TAKE 1 TABLET BY MOUTH 3 TIMES DAILY AS NEEDED FOR DIZZINESS OR NAUSEA 10 tablet 0   metoprolol tartrate (LOPRESSOR) 25 MG tablet Take 25 mg by mouth 2 (two) times daily.     nitroGLYCERIN (NITROSTAT) 0.4 MG SL tablet Place 1 tablet (0.4 mg total) under the tongue every 5 (five) minutes as needed for chest pain. 25 tablet 0   ondansetron (ZOFRAN-ODT) 4 MG disintegrating tablet Take 1 tablet (4 mg total) by mouth every 8 (eight) hours as needed. 20 tablet 0   OneTouch Delica  Lancets 30G MISC by Does not apply route. (Patient not taking: Reported on 11/17/2022)     promethazine (PHENERGAN) 25 MG suppository Place 1 suppository (25 mg total) rectally every 6 (six) hours as needed for nausea or vomiting. 12 each 0   QUEtiapine (SEROQUEL) 300 MG tablet Take 300 mg by mouth at bedtime. Along with 200mg  dose for a total of 500mg      SUMAtriptan (IMITREX) 100 MG tablet TAKE 1 TABLET AT ONSET OF MIGRAINE. MAY REPEAT IN 2 HOURS IF HEADACHE PERSISTS OR RECURS. 10 tablet 0   SYNTHROID 100 MCG tablet Take 100 mcg by mouth daily.     TRULICITY 1.5 MG/0.5ML SOPN Inject 1.5 mg into the skin once a week. (Wednesdays)     Ubrogepant (UBRELVY) 100 MG TABS Take 1 tablet by mouth every other day. 16 tablet 5   No current facility-administered medications for this visit.    Allergies as of 01/25/2023 - Review Complete 01/23/2023  Allergen Reaction Noted   Mirtazapine Anaphylaxis    Morphine and codeine  10/20/2013   Sulfamethoxazole-trimethoprim Rash 06/09/2008   Amoxicillin er Nausea And Vomiting 12/24/2017   Codeine Itching    Hydrocodone-acetaminophen Rash    Keflex [cephalexin] Rash 07/04/2018   Prasterone Nausea And Vomiting 12/24/2017    ROS:  General: Negative for anorexia, weight loss, fever, chills, fatigue, weakness. ENT: Negative for hoarseness, difficulty swallowing , nasal congestion. CV: Negative for chest pain, angina, palpitations, dyspnea on exertion, peripheral edema.  Respiratory: Negative for dyspnea at rest, dyspnea on exertion, cough, sputum, wheezing.  GI: See history of present illness. GU:  Negative for dysuria, hematuria, urinary incontinence, urinary frequency, nocturnal urination.  Endo: Negative for unusual weight change.    Physical Examination:   There were no vitals taken for this visit.  General: Well-nourished, well-developed in no acute distress.  Eyes: No icterus. Conjunctivae pink. Lungs: Clear to auscultation bilaterally.  Non-labored. Heart: Regular rate and rhythm, no murmurs rubs or gallops.  Abdomen: Bowel sounds are normal, nontender, nondistended, no hepatosplenomegaly or masses, no abdominal bruits or hernia , no rebound or guarding.   Extremities: No lower extremity edema. No clubbing or deformities. Neuro: Alert and oriented x 3.  Grossly intact. Skin: Warm and dry, no jaundice.   Psych: Alert and cooperative, normal mood and affect.  Labs:    Imaging Studies: CT Renal Stone Study  Result Date: 01/23/2023 CLINICAL DATA:  Abdominal/flank pain, stone suspected. Right-sided flank pain with dizziness for 2 weeks. Urinary frequency. EXAM: CT ABDOMEN AND PELVIS WITHOUT CONTRAST TECHNIQUE: Multidetector CT imaging of the abdomen and pelvis was performed following the standard protocol without IV contrast. RADIATION DOSE REDUCTION: This exam was performed according to the departmental dose-optimization program which includes automated exposure control, adjustment of the mA and/or kV according to patient size and/or use of iterative reconstruction technique. COMPARISON:  Abdominopelvic CT 11/24/2022 and 10/31/2022. FINDINGS: Lower chest: The visualized lung bases appears stable, without acute findings. There is atherosclerosis of the aorta and coronary arteries. No significant pleural or pericardial effusion. Hepatobiliary: No focal hepatic abnormalities are identified on noncontrast imaging. There is no evidence of significant biliary dilatation status post cholecystectomy. Pancreas: Unremarkable. No pancreatic ductal dilatation or surrounding inflammatory changes. Spleen: Normal in size without focal abnormality. Adrenals/Urinary Tract: Both adrenal glands appear normal. No evidence of urinary tract calculus, suspicious renal lesion or hydronephrosis. There is no perinephric soft tissue stranding. The bladder appears unremarkable for its degree of distention. Stomach/Bowel: No enteric contrast administered. The stomach  appears unremarkable for its degree of distension. No evidence of bowel wall thickening, distention or surrounding inflammatory change. The appendix appears normal. There is prominent stool throughout the colon. Vascular/Lymphatic: There are no enlarged abdominal or pelvic lymph nodes. Aortic and branch vessel atherosclerosis without evidence of aneurysm. Reproductive: Evidence of prior hysterectomy. No evidence of adnexal mass. Other: No evidence of abdominal wall mass or hernia. No ascites or pneumoperitoneum. Musculoskeletal: No acute or significant osseous findings. Sacral spinal stimulator appears unchanged. IMPRESSION: 1. No acute findings or explanation for the patient's symptoms. Specifically, no evidence of urinary tract calculus or hydronephrosis. 2. Prominent stool throughout the colon suggesting constipation. 3. Stable postsurgical changes as described. 4.  Aortic Atherosclerosis (ICD10-I70.0). Electronically Signed   By: Carey Bullocks M.D.   On: 01/23/2023 15:26    Assessment and Plan:   [name] is a 57 y.o. y/o female ***     Midge Minium, MD. Clementeen Graham    Note: This dictation was prepared with Dragon dictation along with smaller phrase technology. Any transcriptional errors that result from this process are unintentional.

## 2023-02-02 ENCOUNTER — Other Ambulatory Visit: Payer: Self-pay | Admitting: Medical

## 2023-02-05 ENCOUNTER — Emergency Department: Payer: Medicare Other

## 2023-02-05 ENCOUNTER — Ambulatory Visit: Payer: Medicare Other | Admitting: Urology

## 2023-02-05 ENCOUNTER — Other Ambulatory Visit: Payer: Self-pay

## 2023-02-05 ENCOUNTER — Encounter: Payer: Self-pay | Admitting: *Deleted

## 2023-02-05 ENCOUNTER — Emergency Department
Admission: EM | Admit: 2023-02-05 | Discharge: 2023-02-05 | Disposition: A | Discharge status: Home / Self Care | Payer: Medicare Other | Attending: Emergency Medicine | Admitting: Emergency Medicine

## 2023-02-05 DIAGNOSIS — J449 Chronic obstructive pulmonary disease, unspecified: Secondary | ICD-10-CM | POA: Insufficient documentation

## 2023-02-05 DIAGNOSIS — N3289 Other specified disorders of bladder: Secondary | ICD-10-CM | POA: Diagnosis not present

## 2023-02-05 DIAGNOSIS — K760 Fatty (change of) liver, not elsewhere classified: Secondary | ICD-10-CM | POA: Diagnosis not present

## 2023-02-05 DIAGNOSIS — I5032 Chronic diastolic (congestive) heart failure: Secondary | ICD-10-CM | POA: Insufficient documentation

## 2023-02-05 DIAGNOSIS — I110 Hypertensive heart disease with heart failure: Secondary | ICD-10-CM | POA: Diagnosis not present

## 2023-02-05 DIAGNOSIS — E039 Hypothyroidism, unspecified: Secondary | ICD-10-CM | POA: Insufficient documentation

## 2023-02-05 DIAGNOSIS — I2510 Atherosclerotic heart disease of native coronary artery without angina pectoris: Secondary | ICD-10-CM | POA: Diagnosis not present

## 2023-02-05 DIAGNOSIS — N183 Chronic kidney disease, stage 3 unspecified: Secondary | ICD-10-CM | POA: Insufficient documentation

## 2023-02-05 DIAGNOSIS — R81 Glycosuria: Secondary | ICD-10-CM | POA: Diagnosis not present

## 2023-02-05 DIAGNOSIS — R300 Dysuria: Secondary | ICD-10-CM

## 2023-02-05 DIAGNOSIS — E1122 Type 2 diabetes mellitus with diabetic chronic kidney disease: Secondary | ICD-10-CM | POA: Insufficient documentation

## 2023-02-05 LAB — URINALYSIS, ROUTINE W REFLEX MICROSCOPIC
Bilirubin Urine: NEGATIVE
Glucose, UA: >=500 mg/dL — AB
Hgb urine dipstick: NEGATIVE
Ketones, ur: 5 mg/dL — AB
Leukocytes,Ua: NEGATIVE
Nitrite: NEGATIVE
Protein, ur: NEGATIVE mg/dL
Specific Gravity, Urine: 1.029 (ref 1.005–1.030)
pH: 5 (ref 5.0–8.0)

## 2023-02-05 LAB — CBC
HCT: 40.9 % (ref 36.0–46.0)
Hemoglobin: 13 g/dL (ref 12.0–15.0)
MCH: 29 pg (ref 26.0–34.0)
MCHC: 31.8 g/dL (ref 30.0–36.0)
MCV: 91.3 fL (ref 80.0–100.0)
Platelets: 229 10*3/uL (ref 150–400)
RBC: 4.48 MIL/uL (ref 3.87–5.11)
RDW: 14.3 % (ref 11.5–15.5)
WBC: 8 10*3/uL (ref 4.0–10.5)
nRBC: 0 % (ref 0.0–0.2)

## 2023-02-05 LAB — COMPREHENSIVE METABOLIC PANEL
ALT: 42 U/L (ref 0–44)
AST: 48 U/L — ABNORMAL HIGH (ref 15–41)
Albumin: 3.5 g/dL (ref 3.5–5.0)
Alkaline Phosphatase: 48 U/L (ref 38–126)
Anion gap: 10 (ref 5–15)
BUN: 22 mg/dL — ABNORMAL HIGH (ref 6–20)
CO2: 26 mmol/L (ref 22–32)
Calcium: 9.2 mg/dL (ref 8.9–10.3)
Chloride: 103 mmol/L (ref 98–111)
Creatinine, Ser: 1.2 mg/dL — ABNORMAL HIGH (ref 0.44–1.00)
GFR, Estimated: 52 mL/min — ABNORMAL LOW (ref >60)
Glucose, Bld: 280 mg/dL — ABNORMAL HIGH (ref 70–99)
Potassium: 4.3 mmol/L (ref 3.5–5.1)
Sodium: 139 mmol/L (ref 135–145)
Total Bilirubin: 0.9 mg/dL (ref 0.3–1.2)
Total Protein: 6.2 g/dL — ABNORMAL LOW (ref 6.5–8.1)

## 2023-02-05 LAB — LIPASE, BLOOD: Lipase: 46 U/L (ref 11–51)

## 2023-02-05 MED ORDER — ONDANSETRON 4 MG PO TBDP
4.0000 mg | ORAL_TABLET | Freq: Once | ORAL | Status: AC
  Administered 2023-02-05: 4 mg via ORAL
  Filled 2023-02-05: qty 1

## 2023-02-05 NOTE — ED Triage Notes (Signed)
Pt reports back pain with dysuria.  Sx for 2 days.  Pt also has vomiting and diarrhea.  Pt alert.

## 2023-02-05 NOTE — ED Provider Notes (Signed)
[facility] Provider Note    Event Date/Time   First MD Initiated Contact with Patient 02/05/23 1742     (approximate)   History   Back Pain and Dysuria   HPI  [name] is a 57 y.o. female with a past medical history of obesity, CKD, COPD, CAD, CHF who presents today for evaluation of dysuria.  Patient reports that this has been ongoing for the past couple of days.  She has also had nausea and diarrhea, but this is not abnormal for her she reports.  She has not had any blood in her stool.  She still able to eat and drink.  She denies abdominal pain or back pain.  No fevers or chills.  She reports that she has had sepsis before and she is very concerned about having that again which is why she comes to the emergency department frequently, "just to be sure."  Patient Active Problem List   Diagnosis Date Noted   Bacteremia due to Escherichia coli 09/13/2022   Encephalopathy acute 09/13/2022   Severe sepsis (HCC) 09/10/2022   Vertigo 02/10/2022   Abnormal urinalysis 02/10/2022   TIA (transient ischemic attack) 10/04/2021   HTN (hypertension) 10/04/2021   Chronic kidney disease, stage 3a (HCC) 10/04/2021   Depression with anxiety 10/04/2021   COPD (chronic obstructive pulmonary disease) (HCC) 10/04/2021   CAD (coronary artery disease) 10/04/2021   Chronic diastolic CHF (congestive heart failure) (HCC) 10/04/2021   Type II diabetes mellitus with renal manifestations (HCC) 06/30/2021   Interstitial cystitis 06/30/2021   Atherosclerosis of aorta (HCC) 03/28/2021   Iron deficiency anemia due to chronic blood loss    Polyp of ascending colon    Vitamin B12 deficiency 08/08/2018   Patient is full code 10/30/2017   Coronary artery disease of native artery of native heart with stable angina pectoris (HCC) 10/09/2017   Plantar fascial fibromatosis 10/09/2017   Tendinitis of wrist 10/09/2017   Schizoaffective disorder, bipolar type (HCC) 09/28/2017    Adenomatous colon polyp 10/15/2015   Bipolar affective disorder (HCC) 10/15/2015   CN (constipation) 10/15/2015   HLD (hyperlipidemia) 10/15/2015   NASH (nonalcoholic steatohepatitis) 10/15/2015   Obesity (BMI 35.0-39.9 without comorbidity) 10/15/2015   Restless leg syndrome 10/15/2015   Low back pain with left-sided sciatica 08/18/2015   Apnea, sleep 05/05/2015   Heart valve disease 05/05/2015   Complicated UTI (urinary tract infection) 03/24/2015   CKD stage 3 due to type 2 diabetes mellitus (HCC) 03/15/2015   Migraine 03/12/2015   Chronic bronchitis (HCC) 01/20/2015   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HCC) 01/20/2015   GERD (gastroesophageal reflux disease) 01/20/2015   Hypothyroid    Vitamin D deficiency    Urinary incontinence    Liver lesion    Hypomagnesemia    Obstructive sleep apnea 10/20/2013   Allergic rhinitis 07/04/2007          Physical Exam   Triage Vital Signs: ED Triage Vitals  Enc Vitals Group     BP 02/05/23 1608 131/74     Pulse Rate 02/05/23 1608 86     Resp 02/05/23 1608 16     Temp 02/05/23 1608 98.4 F (36.9 C)     Temp Source 02/05/23 1608 Oral     SpO2 02/05/23 1608 90 %     Weight 02/05/23 1624 193 lb (87.5 kg)     Height 02/05/23 1624 4\' 11"  (1.499 m)     Head Circumference --      Peak Flow --  Pain Score 02/05/23 1624 8     Pain Loc --      Pain Edu? --      Excl. in GC? --     Most recent vital signs: Vitals:   02/05/23 1608  BP: 131/74  Pulse: 86  Resp: 16  Temp: 98.4 F (36.9 C)  SpO2: 90%    Physical Exam Vitals and nursing note reviewed.  Constitutional:      General: Awake and alert. No acute distress.    Appearance: Normal appearance. The patient is obese.  HENT:     Head: Normocephalic and atraumatic.     Mouth: Mucous membranes are moist.  Eyes:     General: PERRL. Normal EOMs        Right eye: No discharge.        Left eye: No discharge.     Conjunctiva/sclera: Conjunctivae normal.  Cardiovascular:      Rate and Rhythm: Normal rate and regular rhythm.     Pulses: Normal pulses.  Pulmonary:     Effort: Pulmonary effort is normal. No respiratory distress.     Breath sounds: Normal breath sounds.  Abdominal:     Abdomen is soft. There is no abdominal tenderness. No rebound or guarding. No distention.  No CVA tenderness Musculoskeletal:        General: No swelling. Normal range of motion.     Cervical back: Normal range of motion and neck supple.  Skin:    General: Skin is warm and dry.     Capillary Refill: Capillary refill takes less than 2 seconds.     Findings: No rash.  Neurological:     Mental Status: The patient is awake and alert.      ED Results / Procedures / Treatments   Labs (all labs ordered are listed, but only abnormal results are displayed) Labs Reviewed  COMPREHENSIVE METABOLIC PANEL - Abnormal; Notable for the following components:      Result Value   Glucose, Bld 280 (*)    BUN 22 (*)    Creatinine, Ser 1.20 (*)    Total Protein 6.2 (*)    AST 48 (*)    GFR, Estimated 52 (*)    All other components within normal limits  URINALYSIS, ROUTINE W REFLEX MICROSCOPIC - Abnormal; Notable for the following components:   Color, Urine YELLOW (*)    APPearance CLEAR (*)    Glucose, UA >=500 (*)    Ketones, ur 5 (*)    Bacteria, UA RARE (*)    All other components within normal limits  URINE CULTURE  LIPASE, BLOOD  CBC     EKG     RADIOLOGY I independently reviewed and interpreted imaging and agree with radiologists findings.     PROCEDURES:  Critical Care performed:   Procedures   MEDICATIONS ORDERED IN ED: Medications  ondansetron (ZOFRAN-ODT) disintegrating tablet 4 mg (4 mg Oral Given 02/05/23 1751)     IMPRESSION / MDM / ASSESSMENT AND PLAN / ED COURSE  I reviewed the triage vital signs and the nursing notes.   Differential diagnosis includes, but is not limited to, UTI, pyelonephritis, nephrolithiasis, glucosuria.  Patient is awake  and alert, hemodynamically stable and afebrile.  She is nontoxic in appearance.  She has no reproducible abdominal tenderness, no CVA tenderness on exam.  Labs obtained in triage are overall reassuring.  She does have elevated blood sugar to 280, though she has normal bicarb, normal anion gap, not consistent with DKA.  She also has greater than 500 glucose in her urine which is likely the etiology of her dysuria as her urine otherwise is not remarkable for urinary tract infection.  She does have a rare bacteria, though no leukocytes or nitrites, not slam dunk urinary tract infection and therefore will not treat given that glucosuria is symptom more likely etiology at this time.  However, culture was sent given her history of urinary tract infections.  CT renal stone obtained for evaluation of possible kidney stone, this was negative for any acute findings.  We discussed the importance of proper glucose control as this increases her risk of infections and also many other problems, and patient understands and agrees.  We discussed return precautions and the importance of close outpatient follow-up.  Patient or stands and agrees with plan.  She was discharged in stable condition.   Patient's presentation is most consistent with exacerbation of chronic illness.    FINAL CLINICAL IMPRESSION(S) / ED DIAGNOSES   Final diagnoses:  Dysuria  Glucosuria     Rx / DC Orders   ED Discharge Orders     None        Note:  This document was prepared using Dragon voice recognition software and may include unintentional dictation errors.   Keturah Shavers 02/05/23 1849    Corena Herter, MD 02/05/23 [phone]

## 2023-02-05 NOTE — ED Notes (Signed)
Pt A&OX4 ambulatory at d/c with independent steady gait. Pt verbalized understanding of d/c instructions and follow up care. 

## 2023-02-05 NOTE — ED Notes (Signed)
Lab to "add on" urine culture to previous collected urine specimen

## 2023-02-05 NOTE — Discharge Instructions (Signed)
Your CT scan and blood work was normal with the exception of high blood sugar and glucose in your urine.  Please remember to pay close attention to your blood sugar and make sure that it is under control as this may be causing your right discomfort when you pee.  Please return for any new, worsening, or change in symptoms or other concerns.  It was a pleasure caring for you today.

## 2023-02-06 LAB — URINE CULTURE

## 2023-02-07 LAB — URINE CULTURE

## 2023-02-09 ENCOUNTER — Telehealth: Payer: Self-pay

## 2023-02-09 NOTE — Telephone Encounter (Signed)
Transition Care Management Unsuccessful Follow-up Telephone Call  Date of discharge and from where:  [place] 6/3  Attempts:  2nd  Reason for unsuccessful TCM follow-up call:  Left voice message   Lenard Forth [facility] Guide, [facility] [phone] [address] [place], [place], [place] [address] Phone: [phone] Email: [email or web address]

## 2023-02-09 NOTE — Telephone Encounter (Signed)
Transition Care Management Unsuccessful Follow-up Telephone Call  Date of discharge and from where:  [place] 6/3  Attempts:  1st Attempt  Reason for unsuccessful TCM follow-up call:  Left voice message   Lenard Forth [facility] Guide, [facility] [phone] [address] [place], [place], [place] [address] Phone: [phone] Email: [email or web address]

## 2023-02-15 ENCOUNTER — Other Ambulatory Visit: Payer: Self-pay | Admitting: Gastroenterology

## 2023-02-19 ENCOUNTER — Other Ambulatory Visit: Payer: Self-pay | Admitting: Family Medicine

## 2023-02-19 DIAGNOSIS — J3489 Other specified disorders of nose and nasal sinuses: Secondary | ICD-10-CM

## 2023-02-20 NOTE — Progress Notes (Deleted)
Name: [name]   MRN: [id number]    DOB: 17-Jul-1966   Date:02/20/2023       Progress Note  Subjective  Chief Complaint  Follow Up  HPI  RLS: she states she has been on Gabapentin  for a long time. She has an urge to move it at night, both feet feels on fire , sometimes also jumps during the day but mostly at night.  It causes difficulty for her to fall asleep. She has not been taking magnesium lately and advised her to resume it   Intertrigo : under breast and groin, worse spot in on left abdominal fold, itchy, redness no oozing, using topical neosporin but is not helping. She states heat makes it worse.    Chronic bronchitis/hypoxemia/OSA  she states positive for OSA however unable to find a mask that fits her face and is only on nocturnal oxygen 2 liters. . She also has chronic bronchitis ( second hand smoker) denies daily cough and states no longer having SOB . Not currently on medication    GERD: seeing Dr. Servando Snare , she takes PPI and prn promethazine    DMII: seeing Dr. Patrecia Pace, A1C down from 10.6 % in January 2022 and January 2023 it was 7.7 % but during hospital stay it was up to 8.3 %. She states glucose has been higher than usual since brother died 03-30-2024th - on the day of his birthday due to hemorrhagic stroke. She has episodes of  polyphagia, polydipsia or polyuria. She is complaint with her medications. She is on multiple doses of insulin, Trulicity  and oral medication, we placed referral to endo but she states now that glucose is good she wants to stay with Dr. Serafina Mitchell . She has obesity , dyslipidemia , CKI and neuropathy. She was reminded to have yearly eye exam.  She is on ACE, statin therapy and gabapentin for neuropathy.    Hypothyroidism: managed by Dr. Patrecia Pace, she denies dysphagia, no dry skin, no change in bowel movements . Last TSH was low but she states Dr. Patrecia Pace told her to continue same dose, unchanged     Schizoaffective disorder/Bipolar : current under the  care of [facility]  , she is compliant with medications and visits. She states mood has been stable, denies any hypomania or mania,  she has been sad lately, grieving the loss of her brother - died 03/30/2024th 2023   CAD/angina : echo normal EF, she is on medical management, seeing Dr. Mariah Milling. Denies chest pain , she is on medical management and symptoms of angina controlled . Unchanged    Vitamin B12 and Vtiamin D deficiency: advised her to resume supplementation.   Morbid obesity: BMI above 40 with , she states appetite is good again but weight is down 4 lbs in the last 6 months    IgG deficiency: seen by [facility], they recommended follow up yearly, advised her to go back    CKI: she used to see  Nephrologist - Dr. Suezanne Jacquet , she has GFR monitored by Dr. Patrecia Pace and GFR usually around 40's she is on ARB . Denies generalized pruritis    Atherosclerosis of aorta: continue medications, on statin therapy, fenofibrate  and zetia, LDL has been at goal, continue medications    Migraine headaches: episodes varies depending on stress, currently having episodes twice weekly. . She states Imitrex works well for her , discussed adding preventive medication. We will try Ubrelvy. She describes headaches as aching or  tightness, has photophobia and phonophobia  She states occasionally associated with nausea    Patient Active Problem List   Diagnosis Date Noted   Bacteremia due to Escherichia coli 09/13/2022   Encephalopathy acute 09/13/2022   Severe sepsis (HCC) 09/10/2022   Vertigo 02/10/2022   Abnormal urinalysis 02/10/2022   TIA (transient ischemic attack) 10/04/2021   HTN (hypertension) 10/04/2021   Chronic kidney disease, stage 3a (HCC) 10/04/2021   Depression with anxiety 10/04/2021   COPD (chronic obstructive pulmonary disease) (HCC) 10/04/2021   CAD (coronary artery disease) 10/04/2021   Chronic diastolic CHF (congestive heart failure) (HCC) 10/04/2021   Type II diabetes mellitus  with renal manifestations (HCC) 06/30/2021   Interstitial cystitis 06/30/2021   Atherosclerosis of aorta (HCC) 03/28/2021   Iron deficiency anemia due to chronic blood loss    Polyp of ascending colon    Vitamin B12 deficiency 08/08/2018   Patient is full code 10/30/2017   Coronary artery disease of native artery of native heart with stable angina pectoris (HCC) 10/09/2017   Plantar fascial fibromatosis 10/09/2017   Tendinitis of wrist 10/09/2017   Schizoaffective disorder, bipolar type (HCC) 09/28/2017   Adenomatous colon polyp 10/15/2015   Bipolar affective disorder (HCC) 10/15/2015   CN (constipation) 10/15/2015   HLD (hyperlipidemia) 10/15/2015   NASH (nonalcoholic steatohepatitis) 10/15/2015   Obesity (BMI 35.0-39.9 without comorbidity) 10/15/2015   Restless leg syndrome 10/15/2015   Low back pain with left-sided sciatica 08/18/2015   Apnea, sleep 05/05/2015   Heart valve disease 05/05/2015   Complicated UTI (urinary tract infection) 03/24/2015   CKD stage 3 due to type 2 diabetes mellitus (HCC) 03/15/2015   Migraine 03/12/2015   Chronic bronchitis (HCC) 01/20/2015   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HCC) 01/20/2015   GERD (gastroesophageal reflux disease) 01/20/2015   Hypothyroid    Vitamin D deficiency    Urinary incontinence    Liver lesion    Hypomagnesemia    Obstructive sleep apnea 10/20/2013   Allergic rhinitis 07/04/2007    Past Surgical History:  Procedure Laterality Date   ABDOMINAL HYSTERECTOMY     Partial   bladder stim  2007   BLADDER SURGERY     x 2. tacking of bladder and the other was the stimulator placement   CHOLECYSTECTOMY     COLONOSCOPY WITH PROPOFOL N/A 03/04/2019   Procedure: COLONOSCOPY WITH PROPOFOL;  Surgeon: Midge Minium, MD;  Location: [facility] ENDOSCOPY;  Service: Endoscopy;  Laterality: N/A;   ESOPHAGOGASTRODUODENOSCOPY (EGD) WITH PROPOFOL N/A 03/04/2019   Procedure: ESOPHAGOGASTRODUODENOSCOPY (EGD) WITH PROPOFOL;  Surgeon: Midge Minium, MD;   Location: [facility] ENDOSCOPY;  Service: Endoscopy;  Laterality: N/A;   INCISION AND DRAINAGE ABSCESS Right 04/16/2019   Procedure: INCISION AND DRAINAGE ABSCESS;  Surgeon: Sung Amabile, DO;  Location: [facility] ORS;  Service: General;  Laterality: Right;   INTERSTIM-GENERATOR CHANGE N/A 12/31/2017   Procedure: INTERSTIM-GENERATOR CHANGE;  Surgeon: Alfredo Martinez, MD;  Location: [facility] ORS;  Service: Urology;  Laterality: N/A;   LIVER BIOPSY  March 2016   Negative   LUNG BIOPSY  2015   small spot on lung   TUBAL LIGATION     UPPER GI ENDOSCOPY     WRIST SURGERY Right 11/02/2019    Family History  Problem Relation Age of Onset   Diabetes Mother    Heart disease Mother    Hyperlipidemia Mother    Hypertension Mother    Kidney disease Mother    Mental illness Mother    Hypothyroidism Mother    Stroke Mother  Osteoporosis Mother    Glaucoma Mother    Congestive Heart Failure Mother    Hypertension Father    Kidney disease Brother    Intracerebral hemorrhage Brother    Breast cancer Maternal Grandmother 60   Heart disease Maternal Grandfather    Rheum arthritis Maternal Grandfather    Cancer Maternal Grandfather        liver   Kidney disease Maternal Grandfather    Cancer Paternal Grandmother        lung   Asthma Daughter    Cancer Daughter        throat   Bipolar disorder Daughter    Bipolar disorder Daughter    Hypertension Daughter    Restless legs syndrome Daughter     Social History   Tobacco Use   Smoking status: Never   Smokeless tobacco: Never  Substance Use Topics   Alcohol use: No     Current Outpatient Medications:    atorvastatin (LIPITOR) 80 MG tablet, Take 80 mg by mouth at bedtime. , Disp: , Rfl:    busPIRone (BUSPAR) 30 MG tablet, Take 30 mg by mouth 2 (two) times daily., Disp: , Rfl:    citalopram (CELEXA) 20 MG tablet, Take 20 mg by mouth daily., Disp: , Rfl:    esomeprazole (NEXIUM) 40 MG capsule, TAKE 1 CAPSULE BY MOUTH IN THE MORNING, Disp: 30  capsule, Rfl: 0   ezetimibe (ZETIA) 10 MG tablet, Take 10 mg by mouth at bedtime., Disp: , Rfl:    fenofibrate micronized (LOFIBRA) 134 MG capsule, Take 134 mg by mouth at bedtime., Disp: , Rfl:    fluticasone (FLONASE) 50 MCG/ACT nasal spray, PLACE 2 SPRAYS INTO BOTH NOSTRILS DAILY, Disp: 16 g, Rfl: 0   gabapentin (NEURONTIN) 300 MG capsule, Take 300 mg by mouth 2 (two) times daily., Disp: , Rfl:    gabapentin (NEURONTIN) 600 MG tablet, Take 600 mg by mouth at bedtime., Disp: , Rfl:    GLYXAMBI 25-5 MG TABS, Take 1 tablet by mouth daily., Disp: , Rfl:    HUMALOG KWIKPEN 100 UNIT/ML KwikPen, Inject into the skin., Disp: , Rfl:    HUMULIN R U-500 KWIKPEN 500 UNIT/ML kwikpen, Inject 70 Units into the skin 3 (three) times daily with meals., Disp: , Rfl:    hydrOXYzine (ATARAX) 50 MG tablet, Take 50 mg by mouth 3 (three) times daily., Disp: , Rfl:    Insulin Aspart FlexPen (NOVOLOG) 100 UNIT/ML, Inject into the skin., Disp: , Rfl:    Insulin Pen Needle (PEN NEEDLES) 31G X 8 MM MISC, , Disp: , Rfl:    lamoTRIgine (LAMICTAL) 200 MG tablet, Take 200 mg by mouth 2 (two) times daily. , Disp: , Rfl:    linaclotide (LINZESS) 145 MCG CAPS capsule, TAKE 1 CAPSULE BY MOUTH DAILY BEFORE BREAKFAST (Patient taking differently: Take 145 mcg by mouth daily before breakfast.), Disp: 30 capsule, Rfl: 3   meclizine (ANTIVERT) 25 MG tablet, TAKE 1 TABLET BY MOUTH 3 TIMES DAILY AS NEEDED FOR DIZZINESS OR NAUSEA, Disp: 10 tablet, Rfl: 0   metoprolol tartrate (LOPRESSOR) 25 MG tablet, Take 1 tablet (25 mg total) by mouth 2 (two) times daily. NEED OV., Disp: 180 tablet, Rfl: 0   nitroGLYCERIN (NITROSTAT) 0.4 MG SL tablet, Place 1 tablet (0.4 mg total) under the tongue every 5 (five) minutes as needed for chest pain., Disp: 25 tablet, Rfl: 0   ondansetron (ZOFRAN-ODT) 4 MG disintegrating tablet, Take 1 tablet (4 mg total) by mouth every 8 (eight)  hours as needed., Disp: 20 tablet, Rfl: 0   OneTouch Delica Lancets 30G MISC, by  Does not apply route. (Patient not taking: Reported on 11/17/2022), Disp: , Rfl:    promethazine (PHENERGAN) 25 MG suppository, Place 1 suppository (25 mg total) rectally every 6 (six) hours as needed for nausea or vomiting., Disp: 12 each, Rfl: 0   QUEtiapine (SEROQUEL) 300 MG tablet, Take 300 mg by mouth at bedtime. Along with 200mg  dose for a total of 500mg , Disp: , Rfl:    SUMAtriptan (IMITREX) 100 MG tablet, TAKE 1 TABLET AT ONSET OF MIGRAINE. MAY REPEAT IN 2 HOURS IF HEADACHE PERSISTS OR RECURS., Disp: 10 tablet, Rfl: 0   SYNTHROID 100 MCG tablet, Take 100 mcg by mouth daily., Disp: , Rfl:    TRULICITY 1.5 MG/0.5ML SOPN, Inject 1.5 mg into the skin once a week. (Wednesdays), Disp: , Rfl:    Ubrogepant (UBRELVY) 100 MG TABS, Take 1 tablet by mouth every other day., Disp: 16 tablet, Rfl: 5  Allergies  Allergen Reactions   Mirtazapine Anaphylaxis    REACTION: closes throat   Morphine And Codeine     anaphylaxis   Sulfamethoxazole-Trimethoprim Rash    REACTION: Whelps all over   Amoxicillin Er Nausea And Vomiting    Pt doesn't remember problem with amoxicillin -  SE Has patient had a PCN reaction causing immediate rash, facial/tongue/throat swelling, SOB or lightheadedness with hypotension: No Has patient had a PCN reaction causing severe rash involving mucus membranes or skin necrosis: No Has patient had a PCN reaction that required hospitalization: No Has patient had a PCN reaction occurring within the last 10 years: no If all of the above answers are "NO", then may proceed with Cephalosporin use.   Codeine Itching    REACTION: itch, rash   Hydrocodone-Acetaminophen Rash    REACTION: rash   Keflex [Cephalexin] Rash    Pt does not remember having a reaction or rash, no airway involvement   Prasterone Nausea And Vomiting    I personally reviewed active problem list, medication list, allergies, family history, social history, health maintenance with the patient/caregiver  today.   ROS  ***  Objective  There were no vitals filed for this visit.  There is no height or weight on file to calculate BMI.  Physical Exam ***   PHQ2/9:    11/17/2022    9:52 AM 11/02/2022   11:37 AM 10/03/2022    1:16 PM 09/27/2022   10:25 AM 04/27/2022   10:42 AM  Depression screen PHQ 2/9  Decreased Interest 0 0 0 0 2  Down, Depressed, Hopeless 0 0 0 0 1  PHQ - 2 Score 0 0 0 0 3  Altered sleeping   0 0 0  Tired, decreased energy   0 0 0  Change in appetite   0 0 1  Feeling bad or failure about yourself    0 0 0  Trouble concentrating   0 0 0  Moving slowly or fidgety/restless   0 0 0  Suicidal thoughts   0 0 0  PHQ-9 Score   0 0 4    phq 9 is {gen pos [id number]}   Fall Risk:    11/17/2022    9:51 AM 11/02/2022   11:37 AM 10/03/2022    1:16 PM 09/27/2022   10:25 AM 04/27/2022   10:42 AM  Fall Risk   Falls in the past year? 1 1 0 1 1  Number falls in past yr: 1  1 0 1 1  Comment 10      Injury with Fall? 1 0 0 0 0  Risk for fall due to : History of fall(s);Impaired balance/gait;Impaired mobility History of fall(s);Impaired balance/gait;Impaired mobility Impaired balance/gait History of fall(s);Impaired balance/gait Impaired balance/gait  Follow up   Falls prevention discussed Falls prevention discussed;Education provided;Falls evaluation completed Falls prevention discussed      Functional Status Survey:      Assessment & Plan  *** There are no diagnoses linked to this encounter.

## 2023-02-21 ENCOUNTER — Other Ambulatory Visit: Payer: Self-pay

## 2023-02-21 ENCOUNTER — Ambulatory Visit: Payer: Medicare Other | Admitting: Family Medicine

## 2023-02-21 ENCOUNTER — Emergency Department
Admission: EM | Admit: 2023-02-21 | Discharge: 2023-02-21 | Disposition: A | Discharge status: Home / Self Care | Payer: Medicare Other | Attending: Emergency Medicine | Admitting: Emergency Medicine

## 2023-02-21 DIAGNOSIS — I2510 Atherosclerotic heart disease of native coronary artery without angina pectoris: Secondary | ICD-10-CM | POA: Diagnosis not present

## 2023-02-21 DIAGNOSIS — N189 Chronic kidney disease, unspecified: Secondary | ICD-10-CM | POA: Insufficient documentation

## 2023-02-21 DIAGNOSIS — I5030 Unspecified diastolic (congestive) heart failure: Secondary | ICD-10-CM | POA: Diagnosis not present

## 2023-02-21 DIAGNOSIS — E119 Type 2 diabetes mellitus without complications: Secondary | ICD-10-CM | POA: Diagnosis not present

## 2023-02-21 DIAGNOSIS — R300 Dysuria: Secondary | ICD-10-CM | POA: Diagnosis not present

## 2023-02-21 DIAGNOSIS — I130 Hypertensive heart and chronic kidney disease with heart failure and stage 1 through stage 4 chronic kidney disease, or unspecified chronic kidney disease: Secondary | ICD-10-CM | POA: Insufficient documentation

## 2023-02-21 DIAGNOSIS — E1140 Type 2 diabetes mellitus with diabetic neuropathy, unspecified: Secondary | ICD-10-CM

## 2023-02-21 LAB — CBC WITH DIFFERENTIAL/PLATELET
Abs Immature Granulocytes: 0.06 10*3/uL (ref 0.00–0.07)
Basophils Absolute: 0 10*3/uL (ref 0.0–0.1)
Basophils Relative: 0 %
Eosinophils Absolute: 0.2 10*3/uL (ref 0.0–0.5)
Eosinophils Relative: 3 %
HCT: 41.4 % (ref 36.0–46.0)
Hemoglobin: 13 g/dL (ref 12.0–15.0)
Immature Granulocytes: 1 %
Lymphocytes Relative: 29 %
Lymphs Abs: 2 10*3/uL (ref 0.7–4.0)
MCH: 29 pg (ref 26.0–34.0)
MCHC: 31.4 g/dL (ref 30.0–36.0)
MCV: 92.2 fL (ref 80.0–100.0)
Monocytes Absolute: 0.4 10*3/uL (ref 0.1–1.0)
Monocytes Relative: 5 %
Neutro Abs: 4.3 10*3/uL (ref 1.7–7.7)
Neutrophils Relative %: 62 %
Platelets: 215 10*3/uL (ref 150–400)
RBC: 4.49 MIL/uL (ref 3.87–5.11)
RDW: 14.2 % (ref 11.5–15.5)
WBC: 7 10*3/uL (ref 4.0–10.5)
nRBC: 0 % (ref 0.0–0.2)

## 2023-02-21 LAB — BASIC METABOLIC PANEL
Anion gap: 11 (ref 5–15)
BUN: 29 mg/dL — ABNORMAL HIGH (ref 6–20)
CO2: 25 mmol/L (ref 22–32)
Calcium: 9.3 mg/dL (ref 8.9–10.3)
Chloride: 103 mmol/L (ref 98–111)
Creatinine, Ser: 1.27 mg/dL — ABNORMAL HIGH (ref 0.44–1.00)
GFR, Estimated: 49 mL/min — ABNORMAL LOW (ref >60)
Glucose, Bld: 214 mg/dL — ABNORMAL HIGH (ref 70–99)
Potassium: 4.1 mmol/L (ref 3.5–5.1)
Sodium: 139 mmol/L (ref 135–145)

## 2023-02-21 LAB — URINALYSIS, ROUTINE W REFLEX MICROSCOPIC
Bilirubin Urine: NEGATIVE
Glucose, UA: >=500 mg/dL — AB
Hgb urine dipstick: NEGATIVE
Ketones, ur: NEGATIVE mg/dL
Leukocytes,Ua: NEGATIVE
Nitrite: NEGATIVE
Protein, ur: NEGATIVE mg/dL
Specific Gravity, Urine: 1.03 (ref 1.005–1.030)
pH: 5 (ref 5.0–8.0)

## 2023-02-21 MED ORDER — ONDANSETRON 4 MG PO TBDP
4.0000 mg | ORAL_TABLET | Freq: Once | ORAL | Status: AC
  Administered 2023-02-21: 4 mg via ORAL
  Filled 2023-02-21: qty 1

## 2023-02-21 NOTE — ED Provider Notes (Signed)
[facility] Provider Note    Event Date/Time   First MD Initiated Contact with Patient 02/21/23 1822     (approximate)   History   Chief Complaint Dysuria (Patient is here for dysuria and lethargy that began yesterday; h/o frequent UTIs and urosepsis)   HPI  [name] is a 57 y.o. female with past medical history of hypertension, diabetes, CAD, diastolic CHF, CKD, migraines, and schizoaffective disorder who presents to the ED complaining of dysuria.  Patient reports that she has had about 1 week of burning when she urinates along with generalized fatigue.  She has been feeling nauseous but denies any vomiting, diarrhea, or constipation.  She has not had any fevers, abdominal pain, or flank pain.  She is concerned that she could have a UTI, reports developing urosepsis in the past.     Physical Exam   Triage Vital Signs: ED Triage Vitals  Enc Vitals Group     BP 02/21/23 1756 119/66     Pulse Rate 02/21/23 1756 86     Resp 02/21/23 1756 17     Temp 02/21/23 1756 98.4 F (36.9 C)     Temp Source 02/21/23 1756 Oral     SpO2 02/21/23 1756 92 %     Weight 02/21/23 1800 193 lb (87.5 kg)     Height 02/21/23 1800 4\' 11"  (1.499 m)     Head Circumference --      Peak Flow --      Pain Score 02/21/23 1800 8     Pain Loc --      Pain Edu? --      Excl. in GC? --     Most recent vital signs: Vitals:   02/21/23 1756  BP: 119/66  Pulse: 86  Resp: 17  Temp: 98.4 F (36.9 C)  SpO2: 92%    Constitutional: Alert and oriented. Eyes: Conjunctivae are normal. Head: Atraumatic. Nose: No congestion/rhinnorhea. Mouth/Throat: Mucous membranes are moist.  Cardiovascular: Normal rate, regular rhythm. Grossly normal heart sounds.  2+ radial pulses bilaterally. Respiratory: Normal respiratory effort.  No retractions. Lungs CTAB. Gastrointestinal: Soft and nontender.  No CVA tenderness bilaterally.  No distention. Musculoskeletal: No lower extremity  tenderness nor edema.  Neurologic:  Normal speech and language. No gross focal neurologic deficits are appreciated.    ED Results / Procedures / Treatments   Labs (all labs ordered are listed, but only abnormal results are displayed) Labs Reviewed  URINALYSIS, ROUTINE W REFLEX MICROSCOPIC - Abnormal; Notable for the following components:      Result Value   Color, Urine YELLOW (*)    APPearance CLEAR (*)    Glucose, UA >=500 (*)    Bacteria, UA RARE (*)    All other components within normal limits  BASIC METABOLIC PANEL - Abnormal; Notable for the following components:   Glucose, Bld 214 (*)    BUN 29 (*)    Creatinine, Ser 1.27 (*)    GFR, Estimated 49 (*)    All other components within normal limits  CBC WITH DIFFERENTIAL/PLATELET    PROCEDURES:  Critical Care performed: No  Procedures   MEDICATIONS ORDERED IN ED: Medications  ondansetron (ZOFRAN-ODT) disintegrating tablet 4 mg (4 mg Oral Given 02/21/23 1847)     IMPRESSION / MDM / ASSESSMENT AND PLAN / ED COURSE  I reviewed the triage vital signs and the nursing notes.  57 y.o. female with past medical history of hypertension, diabetes, CAD, CHF, CKD, migraine, and schizoaffective disorder who presents to the ED with 1 week of dysuria and fatigue.  Patient's presentation is most consistent with acute presentation with potential threat to life or bodily function.  Differential diagnosis includes, but is not limited to, sepsis, UTI, pyelonephritis, dehydration, lecture abnormality, AKI, hyperglycemia, DKA.  Patient nontoxic-appearing and in no acute distress, vital signs are unremarkable and do not appear concerning for sepsis.  No significant anemia or leukocytosis noted, renal function comparable to previous.  She is mildly hyperglycemic but no evidence of DKA and no evidence of electrolyte abnormality.  Urinalysis pending at this time, no findings to suggest pyelonephritis or kidney  stone.  Urinalysis with no signs of infection, patient may be experiencing dysuria due to glucosuria.  She is tolerating oral intake without difficulty and is appropriate for discharge home with PCP follow-up.  She was counseled to return to the ED for new or worsening symptoms, patient agrees with plan.      FINAL CLINICAL IMPRESSION(S) / ED DIAGNOSES   Final diagnoses:  Dysuria     Rx / DC Orders   ED Discharge Orders     None        Note:  This document was prepared using Dragon voice recognition software and may include unintentional dictation errors.   Chesley Noon, MD 02/21/23 [phone]

## 2023-02-21 NOTE — ED Triage Notes (Signed)
Patient is here for dysuria and lethargy that began yesterday; h/o frequent UTIs and urosepsis

## 2023-02-22 ENCOUNTER — Other Ambulatory Visit: Payer: Self-pay | Admitting: Family Medicine

## 2023-02-22 DIAGNOSIS — R42 Dizziness and giddiness: Secondary | ICD-10-CM

## 2023-02-23 DIAGNOSIS — E7800 Pure hypercholesterolemia, unspecified: Secondary | ICD-10-CM | POA: Diagnosis not present

## 2023-02-23 DIAGNOSIS — K760 Fatty (change of) liver, not elsewhere classified: Secondary | ICD-10-CM | POA: Diagnosis not present

## 2023-02-23 DIAGNOSIS — E1165 Type 2 diabetes mellitus with hyperglycemia: Secondary | ICD-10-CM | POA: Diagnosis not present

## 2023-02-23 DIAGNOSIS — I10 Essential (primary) hypertension: Secondary | ICD-10-CM | POA: Diagnosis not present

## 2023-02-28 ENCOUNTER — Telehealth: Payer: Self-pay

## 2023-02-28 NOTE — Telephone Encounter (Signed)
Transition Care Management Unsuccessful Follow-up Telephone Call  Date of discharge and from where:  [facility] 6/19  Attempts:  1st Attempt  Reason for unsuccessful TCM follow-up call:  Unable to reach patient   Lenard Forth [facility] Guide, [facility] [phone] [address] [place], [place], [place] [address] Phone: [phone] Email: [email or web address]

## 2023-03-01 ENCOUNTER — Telehealth: Payer: Self-pay

## 2023-03-01 NOTE — Telephone Encounter (Signed)
Transition Care Management Unsuccessful Follow-up Telephone Call  Date of discharge and from where:  [facility] 6/19  Attempts:  2nd Attempt  Reason for unsuccessful TCM follow-up call:  Unable to leave message   Lenard Forth [facility] Guide, [facility] [phone] [address] [place], [place], [place] [address] Phone: [phone] Email: [email or web address]

## 2023-03-05 ENCOUNTER — Ambulatory Visit
Admission: RE | Admit: 2023-03-05 | Discharge: 2023-03-05 | Disposition: A | Discharge status: Home / Self Care | Payer: Medicare Other | Source: Ambulatory Visit | Attending: Urology | Admitting: Urology

## 2023-03-05 ENCOUNTER — Ambulatory Visit
Admission: RE | Admit: 2023-03-05 | Discharge: 2023-03-05 | Disposition: A | Discharge status: Home / Self Care | Payer: Medicare Other | Attending: Urology | Admitting: Urology

## 2023-03-05 ENCOUNTER — Ambulatory Visit (INDEPENDENT_AMBULATORY_CARE_PROVIDER_SITE_OTHER): Payer: Medicare Other | Admitting: Urology

## 2023-03-05 VITALS — BP 139/80 | Ht 59.0 in | Wt 193.0 lb

## 2023-03-05 DIAGNOSIS — Z9682 Presence of neurostimulator: Secondary | ICD-10-CM | POA: Diagnosis not present

## 2023-03-05 DIAGNOSIS — N3946 Mixed incontinence: Secondary | ICD-10-CM

## 2023-03-05 LAB — URINALYSIS, COMPLETE
Bilirubin, UA: NEGATIVE
Ketones, UA: NEGATIVE
Leukocytes,UA: NEGATIVE
Nitrite, UA: NEGATIVE
Protein,UA: NEGATIVE
RBC, UA: NEGATIVE
Specific Gravity, UA: <1.005 — ABNORMAL LOW (ref 1.005–1.030)
Urobilinogen, Ur: 0.2 mg/dL (ref 0.2–1.0)
pH, UA: 5.5 (ref 5.0–7.5)

## 2023-03-05 LAB — MICROSCOPIC EXAMINATION: Epithelial Cells (non renal): >10 /hpf — AB (ref 0–10)

## 2023-03-05 MED ORDER — GEMTESA 75 MG PO TABS
75.0000 mg | ORAL_TABLET | Freq: Every day | ORAL | 0 refills | Status: DC
Start: 2023-03-05 — End: 2023-07-03

## 2023-03-05 NOTE — Progress Notes (Signed)
03/05/2023 8:58 AM   Fran Lowes Vanosdol [birth year] [id number]  Referring provider: Alba Cory, MD [address] [place],  [place] [address]  Chief Complaint  Patient presents with   Follow-up    interstim not working     HPI: When I saw the patient in February 2017 the interstim device x-rays looked in good position.  It was interrogated and the impedance check was normal.  She turned the device up she felt it vaginally.  She had good battery life then.  She recently saw Carollee Herter and had a KUB November 27, 2017.  Impedance check was done and bladder was found to be dead.  She has been having some vaginal discomfort apparently with intercourse and has had a previous bladder suspension.   The patient thinks her InterStim was working beautifully until several weeks ago.  She reported being almost completely dry.  Now she has urge incontinence and milder stress incontinence.  She does not wear pads because she says they irritate her.  She changes her close several times.  She ranks the incontinence is milder.  She is now getting up 4 times at night and set up twice.  She can void every 15-20 minutes and cannot hold it for 2 hours.  She feels discomfort or pressure over the IPG.  She is allergic to amoxicillin   December 31, 2017 the patient had the IPG change.  All 4-lead positions looked excellent with good Bellows and toe responses at very low amplitude.   We can see dramatically better.  Improved urge incontinence.  She still has mild stress incontinence and I educated her regarding this.  Incisions look normal.  Some basic teaching was provided by our nurses.     Patient had called in leaking and was trouble shot by Maralyn Sago on Jan 21, 2018. e By history I believe that this did help significantly but in the last 6 weeks she started to leak more again.  She can leak sitting not associated with awareness.  She has moderate bedwetting.  She has urge incontinence.  She can soak a pad when  she goes out in public.  At home she changes her close.  She does not have cystitis symptoms but urine sent for culture.  She does not change the settings whatsoever   She did not feel it rectally and vaginally at 1.1 but did at 1.5 and was left at that amplitude.  Again there was a lot of time needed to connect to the device for unknown reasons.  This happened last time as well.  Maralyn Sago will see her in 2 weeks and make further adjustments.  We do not think the patient is able to do this on her own.  She was told to improve her diet since she had glucose in the urine.  Urine cultures positive     Today I have not seen the patient since August 2019.  The patient said 2 or 3 months ago she was wearing 1 pad a day especially when she went out in public.  Now she wears 3 pads a day that are quite wet.  She leaks with coughing sneezing and with urgency.  She can leak without awareness.  She has bedwetting.  She will wake up and also leak before she gets to the bedside commode  She is voiding every 2-3 hours gets up 3 times a night.  When she turns up the amplitude she feels it vaginally.  She does not know  how to change programs  Clinically not infected    due to staffing issues we will need to have the [employer] rep come in and troubleshoot the device. We will proceed accordingly. Call if urine culture is positive   Today Patient reports in the last 59 year she has been having urge incontinence soaking 3 pads a day.  She has bedwetting.  She is a bedside commode.  She voids every 1 hour and cannot hold it for 2 hours.  She gets up twice a night.  She said she was hospitalized once with sepsis.  1 out of the last for 59 urine cultures in the healthcare system record was positive and the rest were negative.  She is not on any medication for overactive bladder.  She had a new battery in 2019.  The device was placed in 2017 earlier.  No cystitis symptoms.  When her and her husband change programs or increased  amplitude she does not feel anything  Reviewed a note from 2017.  She had actually had InterStim placed in 2011 by Dr. Barnabas Lister.  She was having urge incontinence and bedwetting wearing 4-5 pads a day.  She also had some stress incontinence.  When I saw her back in 2017 the battery had expired.    PMH: Past Medical History:  Diagnosis Date   Anxiety    Arthritis    Asthma    Bipolar depression (HCC)    CHF (congestive heart failure) (HCC)    Chronic headaches    migraines have lessened   Chronic kidney disease    ddstage 111   COPD (chronic obstructive pulmonary disease) (HCC)    Coronary artery disease    Depression    Diabetic neuropathy (HCC)    Dyspnea    uses inhalers for this   Dysrhythmia    rapid   Fatty liver    GERD (gastroesophageal reflux disease)    Headache    Heart attack (HCC) 2009   Hyperlipidemia    Hypertension    Hypomagnesemia    Hypothyroid    Irregular heart rhythm    Liver lesion    Favored to be benign on CT; MRI of abd w/contract in Aug 2016. Liver Biopsy-Negative, March 2016   Morbid obesity (HCC)    OSA (obstructive sleep apnea)    Peripheral vascular disease (HCC)    Pinched nerve    Back   Pneumonia    RLS (restless legs syndrome)    Seasonal allergies    Sleep apnea    uses oxygen at night 2L d/t cpap did not fit   Type 2 diabetes, uncontrolled, with neuropathy 10/15/2015   Overview:  Microalbumin 7.7 12/2010: Hgb A1c 10.2% 12/2010 and 9.7% 03/2011, eye exam 10/2009 [facility]    Urinary incontinence    Vitamin D deficiency     Surgical History: Past Surgical History:  Procedure Laterality Date   ABDOMINAL HYSTERECTOMY     Partial   bladder stim  2007   BLADDER SURGERY     x 2. tacking of bladder and the other was the stimulator placement   CHOLECYSTECTOMY     COLONOSCOPY WITH PROPOFOL N/A 03/04/2019   Procedure: COLONOSCOPY WITH PROPOFOL;  Surgeon: Midge Minium, MD;  Location: [facility] ENDOSCOPY;  Service: Endoscopy;   Laterality: N/A;   ESOPHAGOGASTRODUODENOSCOPY (EGD) WITH PROPOFOL N/A 03/04/2019   Procedure: ESOPHAGOGASTRODUODENOSCOPY (EGD) WITH PROPOFOL;  Surgeon: Midge Minium, MD;  Location: [facility] ENDOSCOPY;  Service: Endoscopy;  Laterality: N/A;   INCISION  AND DRAINAGE ABSCESS Right 04/16/2019   Procedure: INCISION AND DRAINAGE ABSCESS;  Surgeon: Sung Amabile, DO;  Location: [facility] ORS;  Service: General;  Laterality: Right;   INTERSTIM-GENERATOR CHANGE N/A 12/31/2017   Procedure: INTERSTIM-GENERATOR CHANGE;  Surgeon: Alfredo Martinez, MD;  Location: [facility] ORS;  Service: Urology;  Laterality: N/A;   LIVER BIOPSY  March 2016   Negative   LUNG BIOPSY  2015   small spot on lung   TUBAL LIGATION     UPPER GI ENDOSCOPY     WRIST SURGERY Right 11/02/2019    Home Medications:  Allergies as of 03/05/2023       Reactions   Mirtazapine Anaphylaxis   REACTION: closes throat   Morphine And Codeine    anaphylaxis   Sulfamethoxazole-trimethoprim Rash   REACTION: Whelps all over   Amoxicillin Er Nausea And Vomiting   Pt doesn't remember problem with amoxicillin -  SE Has patient had a PCN reaction causing immediate rash, facial/tongue/throat swelling, SOB or lightheadedness with hypotension: No Has patient had a PCN reaction causing severe rash involving mucus membranes or skin necrosis: No Has patient had a PCN reaction that required hospitalization: No Has patient had a PCN reaction occurring within the last 10 years: no If all of the above answers are "NO", then may proceed with Cephalosporin use.   Codeine Itching   REACTION: itch, rash   Hydrocodone-acetaminophen Rash   REACTION: rash   Keflex [cephalexin] Rash   Pt does not remember having a reaction or rash, no airway involvement   Prasterone Nausea And Vomiting        Medication List        Accurate as of March 05, 2023  8:58 AM. If you have any questions, ask your nurse or doctor.          atorvastatin 80 MG tablet Commonly known as:  LIPITOR Take 80 mg by mouth at bedtime.   busPIRone 30 MG tablet Commonly known as: BUSPAR Take 30 mg by mouth 2 (two) times daily.   citalopram 20 MG tablet Commonly known as: CELEXA Take 20 mg by mouth daily.   esomeprazole 40 MG capsule Commonly known as: NEXIUM TAKE 1 CAPSULE BY MOUTH IN THE MORNING   ezetimibe 10 MG tablet Commonly known as: ZETIA Take 10 mg by mouth at bedtime.   fenofibrate micronized 134 MG capsule Commonly known as: LOFIBRA Take 134 mg by mouth at bedtime.   fluticasone 50 MCG/ACT nasal spray Commonly known as: FLONASE PLACE 2 SPRAYS INTO BOTH NOSTRILS DAILY   gabapentin 300 MG capsule Commonly known as: NEURONTIN Take 300 mg by mouth 2 (two) times daily.   gabapentin 600 MG tablet Commonly known as: NEURONTIN Take 600 mg by mouth at bedtime.   Glyxambi 25-5 MG Tabs Generic drug: Empagliflozin-linaGLIPtin Take 1 tablet by mouth daily.   HumaLOG KwikPen 100 UNIT/ML KwikPen Generic drug: insulin lispro Inject into the skin.   HumuLIN R U-500 KwikPen 500 UNIT/ML KwikPen Generic drug: insulin regular human CONCENTRATED Inject 70 Units into the skin 3 (three) times daily with meals.   hydrOXYzine 50 MG tablet Commonly known as: ATARAX Take 50 mg by mouth 3 (three) times daily.   Insulin Aspart FlexPen 100 UNIT/ML Commonly known as: NOVOLOG Inject into the skin.   lamoTRIgine 200 MG tablet Commonly known as: LAMICTAL Take 200 mg by mouth 2 (two) times daily.   Linzess 145 MCG Caps capsule Generic drug: linaclotide TAKE 1 CAPSULE BY MOUTH DAILY BEFORE BREAKFAST What  changed: See the new instructions.   meclizine 25 MG tablet Commonly known as: ANTIVERT TAKE 1 TABLET BY MOUTH 3 TIMES DAILY AS NEEDED FOR DIZZINESS OR NAUSEA   metoprolol tartrate 25 MG tablet Commonly known as: LOPRESSOR Take 1 tablet (25 mg total) by mouth 2 (two) times daily. NEED OV.   nitroGLYCERIN 0.4 MG SL tablet Commonly known as: NITROSTAT Place 1  tablet (0.4 mg total) under the tongue every 5 (five) minutes as needed for chest pain.   ondansetron 4 MG disintegrating tablet Commonly known as: ZOFRAN-ODT Take 1 tablet (4 mg total) by mouth every 8 (eight) hours as needed.   OneTouch Delica Lancets 30G Misc by Does not apply route.   Pen Needles 31G X 8 MM Misc   promethazine 25 MG suppository Commonly known as: PHENERGAN Place 1 suppository (25 mg total) rectally every 6 (six) hours as needed for nausea or vomiting.   QUEtiapine 300 MG tablet Commonly known as: SEROQUEL Take 300 mg by mouth at bedtime. Along with 200mg  dose for a total of 500mg    SUMAtriptan 100 MG tablet Commonly known as: IMITREX TAKE 1 TABLET AT ONSET OF MIGRAINE. MAY REPEAT IN 2 HOURS IF HEADACHE PERSISTS OR RECURS.   Synthroid 100 MCG tablet Generic drug: levothyroxine Take 100 mcg by mouth daily.   Trulicity 1.5 MG/0.5ML Sopn Generic drug: Dulaglutide Inject 1.5 mg into the skin once a week. (Wednesdays)   Ubrelvy 100 MG Tabs Generic drug: Ubrogepant Take 1 tablet by mouth every other day.        Allergies:  Allergies  Allergen Reactions   Mirtazapine Anaphylaxis    REACTION: closes throat   Morphine And Codeine     anaphylaxis   Sulfamethoxazole-Trimethoprim Rash    REACTION: Whelps all over   Amoxicillin Er Nausea And Vomiting    Pt doesn't remember problem with amoxicillin -  SE Has patient had a PCN reaction causing immediate rash, facial/tongue/throat swelling, SOB or lightheadedness with hypotension: No Has patient had a PCN reaction causing severe rash involving mucus membranes or skin necrosis: No Has patient had a PCN reaction that required hospitalization: No Has patient had a PCN reaction occurring within the last 10 years: no If all of the above answers are "NO", then may proceed with Cephalosporin use.   Codeine Itching    REACTION: itch, rash   Hydrocodone-Acetaminophen Rash    REACTION: rash   Keflex [Cephalexin]  Rash    Pt does not remember having a reaction or rash, no airway involvement   Prasterone Nausea And Vomiting    Family History: Family History  Problem Relation Age of Onset   Diabetes Mother    Heart disease Mother    Hyperlipidemia Mother    Hypertension Mother    Kidney disease Mother    Mental illness Mother    Hypothyroidism Mother    Stroke Mother    Osteoporosis Mother    Glaucoma Mother    Congestive Heart Failure Mother    Hypertension Father    Kidney disease Brother    Intracerebral hemorrhage Brother    Breast cancer Maternal Grandmother 60   Heart disease Maternal Grandfather    Rheum arthritis Maternal Grandfather    Cancer Maternal Grandfather        liver   Kidney disease Maternal Grandfather    Cancer Paternal Grandmother        lung   Asthma Daughter    Cancer Daughter  throat   Bipolar disorder Daughter    Bipolar disorder Daughter    Hypertension Daughter    Restless legs syndrome Daughter     Social History:  reports that she has never smoked. She has never used smokeless tobacco. She reports that she does not drink alcohol and does not use drugs.  ROS:                                        Physical Exam: BP 139/80   Ht 4\' 11"  (1.499 m)   Wt 87.5 kg   BMI 38.98 kg/m   Constitutional:  Alert and oriented, No acute distress. HEENT: [place] AT, moist mucus membranes.  Trachea midline, no masses. Cardiovascular: No clubbing, cyanosis, or edema.   Laboratory Data: Lab Results  Component Value Date   WBC 7.0 02/21/2023   HGB 13.0 02/21/2023   HCT 41.4 02/21/2023   MCV 92.2 02/21/2023   PLT 215 02/21/2023    Lab Results  Component Value Date   CREATININE 1.27 (H) 02/21/2023    No results found for: "PSA"  No results found for: "TESTOSTERONE"  Lab Results  Component Value Date   HGBA1C 8.2 10/24/2022    Urinalysis    Component Value Date/Time   COLORURINE YELLOW (A) 02/21/2023 1829    APPEARANCEUR CLEAR (A) 02/21/2023 1829   APPEARANCEUR Hazy (A) 09/19/2021 0937   LABSPEC 1.030 02/21/2023 1829   LABSPEC 1.026 10/09/2014 1808   PHURINE 5.0 02/21/2023 1829   GLUCOSEU >=500 (A) 02/21/2023 1829   GLUCOSEU >=500 10/09/2014 1808   HGBUR NEGATIVE 02/21/2023 1829   HGBUR moderate 01/04/2009 0913   BILIRUBINUR NEGATIVE 02/21/2023 1829   BILIRUBINUR negative 11/17/2022 0955   BILIRUBINUR Negative 09/19/2021 0937   BILIRUBINUR Negative 10/09/2014 1808   KETONESUR NEGATIVE 02/21/2023 1829   PROTEINUR NEGATIVE 02/21/2023 1829   UROBILINOGEN 0.2 11/17/2022 0955   UROBILINOGEN 0.2 01/04/2009 0913   NITRITE NEGATIVE 02/21/2023 1829   LEUKOCYTESUR NEGATIVE 02/21/2023 1829   LEUKOCYTESUR Negative 10/09/2014 1808    Pertinent Imaging: Urine reviewed.  Urine sent for culture.  Chart reviewed  Assessment & Plan: It appears that the lead is approximately 59 years old and the IPG was replaced in 2019.  I will get a lateral and AP view of the sacrum and have her see our nurse practitioner for troubleshooting and assessment.  I will give her Gemtesa samples in the interim since she is never tried this medication.  Call if culture positive.  It would likely be in the best interest of the patient that if we replaced the IPG battery that the lead is also replaced.  I did review this in full detail today but she still needs to be assessed.  Sequelae of retained tip discussed.  Increased risk of infection discussed.  Unsatisfactory results discussed.  Again the lead is 59 years old and she agreed that we will go ahead and replace the entire device.  I have counseled her for surgery but I want the above steps performed and we will proceed accordingly  Patient is on blood thinners and we need to have clearance to discontinue them  1. Mixed incontinence  - Urinalysis, Complete   No follow-ups on file.  Martina Sinner, MD  [facility] [address], [address] [place], [place] [address] [phone]

## 2023-03-08 LAB — CULTURE, URINE COMPREHENSIVE

## 2023-03-15 ENCOUNTER — Other Ambulatory Visit: Payer: Self-pay | Admitting: Gastroenterology

## 2023-03-15 ENCOUNTER — Emergency Department
Admission: EM | Admit: 2023-03-15 | Discharge: 2023-03-15 | Disposition: A | Discharge status: Home / Self Care | Payer: Medicare Other | Attending: Student in an Organized Health Care Education/Training Program | Admitting: Student in an Organized Health Care Education/Training Program

## 2023-03-15 ENCOUNTER — Emergency Department: Payer: Medicare Other

## 2023-03-15 DIAGNOSIS — E1165 Type 2 diabetes mellitus with hyperglycemia: Secondary | ICD-10-CM | POA: Diagnosis not present

## 2023-03-15 DIAGNOSIS — R739 Hyperglycemia, unspecified: Secondary | ICD-10-CM | POA: Insufficient documentation

## 2023-03-15 DIAGNOSIS — R42 Dizziness and giddiness: Secondary | ICD-10-CM | POA: Diagnosis not present

## 2023-03-15 DIAGNOSIS — I959 Hypotension, unspecified: Secondary | ICD-10-CM | POA: Diagnosis not present

## 2023-03-15 DIAGNOSIS — R29818 Other symptoms and signs involving the nervous system: Secondary | ICD-10-CM | POA: Diagnosis not present

## 2023-03-15 DIAGNOSIS — J019 Acute sinusitis, unspecified: Secondary | ICD-10-CM | POA: Insufficient documentation

## 2023-03-15 DIAGNOSIS — E860 Dehydration: Secondary | ICD-10-CM | POA: Diagnosis not present

## 2023-03-15 DIAGNOSIS — R0902 Hypoxemia: Secondary | ICD-10-CM | POA: Diagnosis not present

## 2023-03-15 DIAGNOSIS — G238 Other specified degenerative diseases of basal ganglia: Secondary | ICD-10-CM | POA: Diagnosis not present

## 2023-03-15 LAB — BASIC METABOLIC PANEL
Anion gap: 9 (ref 5–15)
BUN: 25 mg/dL — ABNORMAL HIGH (ref 6–20)
CO2: 25 mmol/L (ref 22–32)
Calcium: 8.9 mg/dL (ref 8.9–10.3)
Chloride: 107 mmol/L (ref 98–111)
Creatinine, Ser: 1.06 mg/dL — ABNORMAL HIGH (ref 0.44–1.00)
GFR, Estimated: >60 mL/min (ref >60)
Glucose, Bld: 232 mg/dL — ABNORMAL HIGH (ref 70–99)
Potassium: 4.4 mmol/L (ref 3.5–5.1)
Sodium: 141 mmol/L (ref 135–145)

## 2023-03-15 LAB — URINALYSIS, ROUTINE W REFLEX MICROSCOPIC
Bacteria, UA: NONE SEEN
Bilirubin Urine: NEGATIVE
Glucose, UA: >=500 mg/dL — AB
Hgb urine dipstick: NEGATIVE
Ketones, ur: NEGATIVE mg/dL
Leukocytes,Ua: NEGATIVE
Nitrite: NEGATIVE
Protein, ur: NEGATIVE mg/dL
Specific Gravity, Urine: 1.021 (ref 1.005–1.030)
pH: 6 (ref 5.0–8.0)

## 2023-03-15 LAB — CBC
HCT: 40 % (ref 36.0–46.0)
Hemoglobin: 12.7 g/dL (ref 12.0–15.0)
MCH: 29.3 pg (ref 26.0–34.0)
MCHC: 31.8 g/dL (ref 30.0–36.0)
MCV: 92.4 fL (ref 80.0–100.0)
Platelets: 193 10*3/uL (ref 150–400)
RBC: 4.33 MIL/uL (ref 3.87–5.11)
RDW: 14.1 % (ref 11.5–15.5)
WBC: 5.2 10*3/uL (ref 4.0–10.5)
nRBC: 0 % (ref 0.0–0.2)

## 2023-03-15 MED ORDER — SODIUM CHLORIDE 0.9 % IV BOLUS
500.0000 mL | Freq: Once | INTRAVENOUS | Status: AC
  Administered 2023-03-15: 500 mL via INTRAVENOUS

## 2023-03-15 MED ORDER — DOXYCYCLINE HYCLATE 100 MG PO TABS
100.0000 mg | ORAL_TABLET | Freq: Once | ORAL | Status: AC
  Administered 2023-03-15: 100 mg via ORAL
  Filled 2023-03-15: qty 1

## 2023-03-15 MED ORDER — MECLIZINE HCL 25 MG PO TABS
25.0000 mg | ORAL_TABLET | Freq: Once | ORAL | Status: AC
  Administered 2023-03-15: 25 mg via ORAL
  Filled 2023-03-15: qty 1

## 2023-03-15 MED ORDER — DOXYCYCLINE HYCLATE 100 MG PO TABS: 100.0000 mg | ORAL_TABLET | Freq: Two times a day (BID) | ORAL | 0 refills | Status: AC

## 2023-03-15 MED ORDER — ONDANSETRON 4 MG PO TBDP
4.0000 mg | ORAL_TABLET | Freq: Once | ORAL | Status: AC
  Administered 2023-03-15: 4 mg via ORAL
  Filled 2023-03-15: qty 1

## 2023-03-15 MED ORDER — INSULIN ASPART 100 UNIT/ML IJ SOLN
5.0000 [IU] | Freq: Once | INTRAMUSCULAR | Status: AC
  Administered 2023-03-15: 5 [IU] via SUBCUTANEOUS
  Filled 2023-03-15: qty 1

## 2023-03-15 NOTE — ED Provider Notes (Signed)
[facility] Provider Note    Event Date/Time   First MD Initiated Contact with Patient 03/15/23 1253     (approximate)   History   Nausea   HPI  [name] is a 57 y.o. female with an extensive past medical history including vertigo presents to the ER for evaluation of dizziness as well as increased urinary frequency.  Denies any numbness or tingling.  No headaches.  No medication changes recently.  Denies any chest pain or shortness of breath.  Dizziness is worse with standing.  Does not feel dizzy at rest.     Physical Exam   Triage Vital Signs: ED Triage Vitals  Encounter Vitals Group     BP 03/15/23 1146 107/74     Systolic BP Percentile --      Diastolic BP Percentile --      Pulse Rate 03/15/23 1144 73     Resp 03/15/23 1144 18     Temp 03/15/23 1149 98.4 F (36.9 C)     Temp Source 03/15/23 1149 Oral     SpO2 03/15/23 1144 97 %     Weight 03/15/23 1145 192 lb 14.4 oz (87.5 kg)     Height 03/15/23 1145 4\' 11"  (1.499 m)     Head Circumference --      Peak Flow --      Pain Score 03/15/23 1145 0     Pain Loc --      Pain Education --      Exclude from Growth Chart --     Most recent vital signs: Vitals:   03/15/23 1146 03/15/23 1149  BP: 107/74   Pulse:    Resp:    Temp:  98.4 F (36.9 C)  SpO2:       Constitutional: Alert  Eyes: Conjunctivae are normal.  Head: Atraumatic. Nose: No congestion/rhinnorhea. Mouth/Throat: Mucous membranes are moist.   Neck: Painless ROM.  Cardiovascular:   Good peripheral circulation. Respiratory: Normal respiratory effort.  No retractions.  Gastrointestinal: Soft and nontender.  Musculoskeletal:  no deformity Neurologic:  MAE spontaneously. No gross focal neurologic deficits are appreciated.  Benign hints exam. Skin:  Skin is warm, dry and intact. No rash noted. Psychiatric: Mood and affect are normal. Speech and behavior are normal.    ED Results / Procedures / Treatments    Labs (all labs ordered are listed, but only abnormal results are displayed) Labs Reviewed  BASIC METABOLIC PANEL - Abnormal; Notable for the following components:      Result Value   Glucose, Bld 232 (*)    BUN 25 (*)    Creatinine, Ser 1.06 (*)    All other components within normal limits  URINALYSIS, ROUTINE W REFLEX MICROSCOPIC - Abnormal; Notable for the following components:   Color, Urine YELLOW (*)    APPearance CLEAR (*)    Glucose, UA >=500 (*)    All other components within normal limits  CBC  CBG MONITORING, ED     EKG  ED ECG REPORT I, Willy Eddy, the attending physician, personally viewed and interpreted this ECG.   Date: 03/15/2023  EKG Time: 11:51  Rate: 75  Rhythm: sinus  Axis: normal  Intervals:normal  ST&T Change:  no stemi, no depressions    RADIOLOGY Please see ED Course for my review and interpretation.  I personally reviewed all radiographic images ordered to evaluate for the above acute complaints and reviewed radiology reports and findings.  These findings were personally discussed  with the patient.  Please see medical record for radiology report.    PROCEDURES:  Critical Care performed:   Procedures   MEDICATIONS ORDERED IN ED: Medications  doxycycline (VIBRA-TABS) tablet 100 mg (has no administration in time range)  ondansetron (ZOFRAN-ODT) disintegrating tablet 4 mg (4 mg Oral Given 03/15/23 1150)  meclizine (ANTIVERT) tablet 25 mg (25 mg Oral Given 03/15/23 1314)  sodium chloride 0.9 % bolus 500 mL (500 mLs Intravenous New Bag/Given 03/15/23 1318)  insulin aspart (novoLOG) injection 5 Units (5 Units Subcutaneous Given 03/15/23 1336)     IMPRESSION / MDM / ASSESSMENT AND PLAN / ED COURSE  I reviewed the triage vital signs and the nursing notes.                              Differential diagnosis includes, but is not limited to, BPPV, vertigo, dehydration, electrolyte abnormality, hypoglycemia, CVA, mass  Patient  presenting to the ER for evaluation of symptoms as described above.  Based on symptoms, risk factors and considered above differential, this presenting complaint could reflect a potentially life-threatening illness therefore the patient will be placed on continuous pulse oximetry and telemetry for monitoring.  Laboratory evaluation will be sent to evaluate for the above complaints.  CT head my review and interpretation does not show any evidence of IPH or mass.  There is evidence of probable sinusitis which we will treat.  Does have hyperglycemia but no gap and no acidosis.  Patient given IV fluids as well as Antivert with improvement in symptoms.  This does seem more consistent with her history of peripheral vertigo.  Does appear stable and appropriate with outpatient follow-up.       FINAL CLINICAL IMPRESSION(S) / ED DIAGNOSES   Final diagnoses:  Dizziness  Acute sinusitis, recurrence not specified, unspecified location  Hyperglycemia     Rx / DC Orders   ED Discharge Orders          Ordered    doxycycline (VIBRA-TABS) 100 MG tablet  2 times daily        03/15/23 1419             Note:  This document was prepared using Dragon voice recognition software and may include unintentional dictation errors.    Willy Eddy, MD 03/15/23 [phone]

## 2023-03-15 NOTE — ED Triage Notes (Signed)
Pt here via [facility] with N/V since yesterday. Pt denies pain but is having dizziness. Pt states she can normally walk but has not been able to walk since yesterday. Pt alert and oriented.   140/80 92% RA 66 98.3

## 2023-03-16 ENCOUNTER — Emergency Department
Admission: EM | Admit: 2023-03-16 | Discharge: 2023-03-16 | Disposition: A | Discharge status: Home / Self Care | Payer: Medicare Other | Attending: Emergency Medicine | Admitting: Emergency Medicine

## 2023-03-16 ENCOUNTER — Other Ambulatory Visit: Payer: Self-pay

## 2023-03-16 DIAGNOSIS — J449 Chronic obstructive pulmonary disease, unspecified: Secondary | ICD-10-CM | POA: Diagnosis not present

## 2023-03-16 DIAGNOSIS — I509 Heart failure, unspecified: Secondary | ICD-10-CM | POA: Diagnosis not present

## 2023-03-16 DIAGNOSIS — E6609 Other obesity due to excess calories: Secondary | ICD-10-CM | POA: Diagnosis not present

## 2023-03-16 DIAGNOSIS — E1165 Type 2 diabetes mellitus with hyperglycemia: Secondary | ICD-10-CM | POA: Diagnosis not present

## 2023-03-16 DIAGNOSIS — I10 Essential (primary) hypertension: Secondary | ICD-10-CM | POA: Diagnosis not present

## 2023-03-16 DIAGNOSIS — N189 Chronic kidney disease, unspecified: Secondary | ICD-10-CM | POA: Insufficient documentation

## 2023-03-16 DIAGNOSIS — W1839XA Other fall on same level, initial encounter: Secondary | ICD-10-CM | POA: Diagnosis not present

## 2023-03-16 DIAGNOSIS — E1122 Type 2 diabetes mellitus with diabetic chronic kidney disease: Secondary | ICD-10-CM | POA: Insufficient documentation

## 2023-03-16 DIAGNOSIS — Z7901 Long term (current) use of anticoagulants: Secondary | ICD-10-CM | POA: Diagnosis not present

## 2023-03-16 DIAGNOSIS — R42 Dizziness and giddiness: Secondary | ICD-10-CM

## 2023-03-16 DIAGNOSIS — I130 Hypertensive heart and chronic kidney disease with heart failure and stage 1 through stage 4 chronic kidney disease, or unspecified chronic kidney disease: Secondary | ICD-10-CM | POA: Insufficient documentation

## 2023-03-16 DIAGNOSIS — W19XXXA Unspecified fall, initial encounter: Secondary | ICD-10-CM | POA: Diagnosis not present

## 2023-03-16 DIAGNOSIS — R531 Weakness: Secondary | ICD-10-CM | POA: Diagnosis not present

## 2023-03-16 LAB — URINALYSIS, ROUTINE W REFLEX MICROSCOPIC
Bacteria, UA: NONE SEEN
Bilirubin Urine: NEGATIVE
Glucose, UA: >=500 mg/dL — AB
Hgb urine dipstick: NEGATIVE
Ketones, ur: NEGATIVE mg/dL
Leukocytes,Ua: NEGATIVE
Nitrite: NEGATIVE
Protein, ur: NEGATIVE mg/dL
Specific Gravity, Urine: 1.035 — ABNORMAL HIGH (ref 1.005–1.030)
pH: 5 (ref 5.0–8.0)

## 2023-03-16 LAB — CBC
HCT: 39.6 % (ref 36.0–46.0)
Hemoglobin: 12.6 g/dL (ref 12.0–15.0)
MCH: 29.2 pg (ref 26.0–34.0)
MCHC: 31.8 g/dL (ref 30.0–36.0)
MCV: 91.7 fL (ref 80.0–100.0)
Platelets: 218 10*3/uL (ref 150–400)
RBC: 4.32 MIL/uL (ref 3.87–5.11)
RDW: 14.3 % (ref 11.5–15.5)
WBC: 5.8 10*3/uL (ref 4.0–10.5)
nRBC: 0 % (ref 0.0–0.2)

## 2023-03-16 LAB — CBG MONITORING, ED
Glucose-Capillary: 57 mg/dL — ABNORMAL LOW (ref 70–99)
Glucose-Capillary: 86 mg/dL (ref 70–99)
Glucose-Capillary: 108 mg/dL — ABNORMAL HIGH (ref 70–99)

## 2023-03-16 LAB — COMPREHENSIVE METABOLIC PANEL
ALT: 36 U/L (ref 0–44)
AST: 41 U/L (ref 15–41)
Albumin: 3.6 g/dL (ref 3.5–5.0)
Alkaline Phosphatase: 41 U/L (ref 38–126)
Anion gap: 8 (ref 5–15)
BUN: 22 mg/dL — ABNORMAL HIGH (ref 6–20)
CO2: 26 mmol/L (ref 22–32)
Calcium: 8.8 mg/dL — ABNORMAL LOW (ref 8.9–10.3)
Chloride: 111 mmol/L (ref 98–111)
Creatinine, Ser: 1.03 mg/dL — ABNORMAL HIGH (ref 0.44–1.00)
GFR, Estimated: >60 mL/min (ref >60)
Glucose, Bld: 65 mg/dL — ABNORMAL LOW (ref 70–99)
Potassium: 3.8 mmol/L (ref 3.5–5.1)
Sodium: 145 mmol/L (ref 135–145)
Total Bilirubin: 0.4 mg/dL (ref 0.3–1.2)
Total Protein: 6 g/dL — ABNORMAL LOW (ref 6.5–8.1)

## 2023-03-16 MED ORDER — MECLIZINE HCL 25 MG PO TABS
25.0000 mg | ORAL_TABLET | Freq: Once | ORAL | Status: AC
  Administered 2023-03-16: 25 mg via ORAL
  Filled 2023-03-16: qty 1

## 2023-03-16 MED ORDER — MECLIZINE HCL 25 MG PO TABS: 25.0000 mg | ORAL_TABLET | Freq: Three times a day (TID) | ORAL | 0 refills | Status: DC | PRN

## 2023-03-16 NOTE — ED Notes (Signed)
Pt states dizziness has resolved.

## 2023-03-16 NOTE — ED Triage Notes (Signed)
Pt here via [facility] from home. Pt states she has been weak since this morning. Pt states she also has tremors and is not eating like she should. Pt stable in triage.

## 2023-03-16 NOTE — ED Provider Triage Note (Signed)
Emergency Medicine Provider Triage Evaluation Note  [name], a 57 y.o. female  was evaluated in triage.  Pt complains of hypoglycemia and weakness.  Patient presents to the ED via EMS from home.  Patient reports weakness since this morning.  She also endorses some tremors and admits that she has had poor oral intake.  Patient was evaluated in the ED yesterday with complaints of sinus congestion and dizziness, discharged with antibiotic for presumed sinusitis.  She returns to the ED today noting generalized weakness.  She endorses a daily blood thinner use, and also reports 80 units of insulin 3 times daily given routinely by her husband.  She would also endorse daily CBGs of about 100-110, and still endorses taking her insulin.  Patient with reported fall yesterday but denies any serious head injury or LOC.  Vital sign report given by EMS and includes a blood pressure 118/74, 91% on room air, and a CBG of 214.  Review of Systems  Positive: Weakness, hypoglycemia Negative: NV  Physical Exam  BP (!) 106/56   Pulse 75   Temp 98.4 F (36.9 C) (Oral)   Resp 18   Ht 4\' 11"  (1.499 m)   Wt 87.5 kg   SpO2 95%   BMI 38.96 kg/m  Gen:   Awake, no distress  NAD Resp:  Normal effort CTA MSK:   Moves extremities without difficulty  Other:    Medical Decision Making  Medically screening exam initiated at 12:00 PM.  Appropriate orders placed.  [name] was informed that the remainder of the evaluation will be completed by another provider, this initial triage assessment does not replace that evaluation, and the importance of remaining in the ED until their evaluation is complete.  Patient to the ED via EMS from home, with a history of diabetes and found to be hypoglycemic at 57 mg/dl in triage.  Patient tolerated p.o. at this time and will be further evaluated in the ED.   Stacey Hoard, PA-C 03/16/23 1203

## 2023-03-16 NOTE — Discharge Instructions (Signed)
Please take your meclizine as needed for dizziness as prescribed.  Return to the emergency department for any worsening dizziness symptoms or any other symptom personally concerning to yourself

## 2023-03-16 NOTE — ED Triage Notes (Signed)
First nurse note: Arrived by EMS from home. Reports fall. Seen yesterday for vertigo and sinus infection. Denies LOC.   Takes blood thinner daily.   EMS vitals:  118/74 b/p 91% Ra 74HR 214CBG

## 2023-03-16 NOTE — ED Provider Notes (Signed)
[facility] Provider Note    Event Date/Time   First MD Initiated Contact with Patient 03/16/23 1330     (approximate)  History   Chief Complaint: Weakness  HPI  [name] is a 57 y.o. female with a past medical history of anxiety, CHF, COPD, CKD, hypertension, hyperlipidemia, diabetes, presents to the emergency department for dizziness and a fall.  According to the patient she was seen here yesterday for the same which she describes as dizziness such as the room spinning around her.  Patient states she received meclizine here yesterday which seemed to help significantly however she states the dizziness returned today causing her to fall onto her buttocks.  Patient denies any pain from the fall did not hit her head per patient.  States she is feeling somewhat better lying in bed currently.  Denies any recent illnesses such as fever cough congestion.  Physical Exam   Triage Vital Signs: ED Triage Vitals [03/16/23 1142]  Encounter Vitals Group     BP (!) 106/56     Systolic BP Percentile      Diastolic BP Percentile      Pulse Rate 75     Resp 18     Temp 98.4 F (36.9 C)     Temp Source Oral     SpO2 95 %     Weight 192 lb 14.4 oz (87.5 kg)     Height 4\' 11"  (1.499 m)     Head Circumference      Peak Flow      Pain Score 0     Pain Loc      Pain Education      Exclude from Growth Chart     Most recent vital signs: Vitals:   03/16/23 1142  BP: (!) 106/56  Pulse: 75  Resp: 18  Temp: 98.4 F (36.9 C)  SpO2: 95%    General: Awake, no distress.  CV:  Good peripheral perfusion.  Regular rate and rhythm  Resp:  Normal effort.  Equal breath sounds bilaterally.  Abd:  No distention.  Soft, nontender.  No rebound or guarding.  ED Results / Procedures / Treatments   EKG  EKG viewed and interpreted by myself shows a normal sinus rhythm at 73 bpm with a narrow QRS, normal axis, normal intervals besides slight QTc prolongation.  Nonspecific  ST changes without ST elevation.   MEDICATIONS ORDERED IN ED: Medications  meclizine (ANTIVERT) tablet 25 mg (has no administration in time range)     IMPRESSION / MDM / ASSESSMENT AND PLAN / ED COURSE  I reviewed the triage vital signs and the nursing notes.  Patient's presentation is most consistent with acute presentation with potential threat to life or bodily function.  Patient presents to the emergency department for dizziness described as a room spinning sensation.  Patient states she has had this multiple times in the past and meclizine seems to work well for her.  States she received meclizine here yesterday but did not receive a prescription going home.  Patient's workup in the emergency department is reassuring with a normal urinalysis, normal CBC and reassuring chemistry.  We will dose meclizine in the emergency department.  Otherwise patient appears well with a reassuring physical exam and reassuring workup I believe the patient could be safely discharged home with a prescription for meclizine.  Patient has a PCP appointment scheduled for Monday at 8:45 AM per patient.  Patient is agreeable this plan of care.  FINAL CLINICAL IMPRESSION(S) / ED DIAGNOSES   Dizziness Vertigo  Rx / DC Orders   Meclizine  Note:  This document was prepared using Dragon voice recognition software and may include unintentional dictation errors.   Minna Antis, MD 03/16/23 1410

## 2023-03-19 ENCOUNTER — Encounter: Payer: Self-pay | Admitting: Family Medicine

## 2023-03-19 ENCOUNTER — Ambulatory Visit (INDEPENDENT_AMBULATORY_CARE_PROVIDER_SITE_OTHER): Payer: Medicare Other | Admitting: Family Medicine

## 2023-03-19 VITALS — BP 108/62 | HR 82 | Temp 97.9°F | Resp 16 | Ht 59.0 in | Wt 189.5 lb

## 2023-03-19 DIAGNOSIS — R42 Dizziness and giddiness: Secondary | ICD-10-CM

## 2023-03-19 DIAGNOSIS — Z7409 Other reduced mobility: Secondary | ICD-10-CM

## 2023-03-19 DIAGNOSIS — E039 Hypothyroidism, unspecified: Secondary | ICD-10-CM

## 2023-03-19 DIAGNOSIS — Z794 Long term (current) use of insulin: Secondary | ICD-10-CM

## 2023-03-19 DIAGNOSIS — E119 Type 2 diabetes mellitus without complications: Secondary | ICD-10-CM | POA: Insufficient documentation

## 2023-03-19 DIAGNOSIS — Z09 Encounter for follow-up examination after completed treatment for conditions other than malignant neoplasm: Secondary | ICD-10-CM

## 2023-03-19 DIAGNOSIS — E162 Hypoglycemia, unspecified: Secondary | ICD-10-CM

## 2023-03-19 DIAGNOSIS — R296 Repeated falls: Secondary | ICD-10-CM | POA: Diagnosis not present

## 2023-03-19 MED ORDER — MECLIZINE HCL 25 MG PO TABS
25.0000 mg | ORAL_TABLET | Freq: Three times a day (TID) | ORAL | 1 refills | Status: DC | PRN
Start: 2023-03-19 — End: 2023-03-27

## 2023-03-19 MED ORDER — GVOKE HYPOPEN 2-PACK 1 MG/0.2ML ~~LOC~~ SOAJ
1.0000 mg | SUBCUTANEOUS | 1 refills | Status: DC | PRN
Start: 2023-03-19 — End: 2023-07-03

## 2023-03-19 MED ORDER — ONDANSETRON 4 MG PO TBDP
4.0000 mg | ORAL_TABLET | Freq: Three times a day (TID) | ORAL | 0 refills | Status: DC | PRN
Start: 2023-03-19 — End: 2023-12-11

## 2023-03-19 NOTE — Progress Notes (Signed)
Patient ID: [name], female    DOB: [birth year], 57 y.o.   MRN: [id number]  PCP: Alba Cory, MD  Chief Complaint  Patient presents with   Hospitalization Follow-up    Dx: vertigo, feeling better meclizine helping    Subjective:   [name] is a 57 y.o. female, presents to clinic with CC of the following:  HPI  Hf/up ED visit x 2 for dizziness episode/falls  Similar episodes and sx last year for which she was admitted and then referred to outpt PT She also was dx with a sinus infection and is tx with doxycycline With the antibiotic and the meclizine (which she has take a few doses of since last ED visit on 7/12) she feels much better. The dizziness occurs with head movements and lasts about 30 min, she has difficulty ambulating during that time and has some associated nausea  She needs new endocrinology referral - her endo is retiring - req referral to [facility] for hypothyroid and IDDM     Patient Active Problem List   Diagnosis Date Noted   Bacteremia due to Escherichia coli 09/13/2022   Encephalopathy acute 09/13/2022   Severe sepsis (HCC) 09/10/2022   Vertigo 02/10/2022   Abnormal urinalysis 02/10/2022   TIA (transient ischemic attack) 10/04/2021   HTN (hypertension) 10/04/2021   Chronic kidney disease, stage 3a (HCC) 10/04/2021   Depression with anxiety 10/04/2021   COPD (chronic obstructive pulmonary disease) (HCC) 10/04/2021   CAD (coronary artery disease) 10/04/2021   Chronic diastolic CHF (congestive heart failure) (HCC) 10/04/2021   Type II diabetes mellitus with renal manifestations (HCC) 06/30/2021   Interstitial cystitis 06/30/2021   Atherosclerosis of aorta (HCC) 03/28/2021   Iron deficiency anemia due to chronic blood loss    Polyp of ascending colon    Vitamin B12 deficiency 08/08/2018   Patient is full code 10/30/2017   Coronary artery disease of native artery of native heart with stable angina pectoris (HCC) 10/09/2017   Plantar  fascial fibromatosis 10/09/2017   Tendinitis of wrist 10/09/2017   Schizoaffective disorder, bipolar type (HCC) 09/28/2017   Adenomatous colon polyp 10/15/2015   Bipolar affective disorder (HCC) 10/15/2015   CN (constipation) 10/15/2015   HLD (hyperlipidemia) 10/15/2015   NASH (nonalcoholic steatohepatitis) 10/15/2015   Obesity (BMI 35.0-39.9 without comorbidity) 10/15/2015   Restless leg syndrome 10/15/2015   Low back pain with left-sided sciatica 08/18/2015   Apnea, sleep 05/05/2015   Heart valve disease 05/05/2015   Complicated UTI (urinary tract infection) 03/24/2015   CKD stage 3 due to type 2 diabetes mellitus (HCC) 03/15/2015   Migraine 03/12/2015   Chronic bronchitis (HCC) 01/20/2015   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HCC) 01/20/2015   GERD (gastroesophageal reflux disease) 01/20/2015   Hypothyroid    Vitamin D deficiency    Urinary incontinence    Liver lesion    Hypomagnesemia    Obstructive sleep apnea 10/20/2013   Allergic rhinitis 07/04/2007      Current Outpatient Medications:    atorvastatin (LIPITOR) 80 MG tablet, Take 80 mg by mouth at bedtime. , Disp: , Rfl:    busPIRone (BUSPAR) 30 MG tablet, Take 30 mg by mouth 2 (two) times daily., Disp: , Rfl:    citalopram (CELEXA) 20 MG tablet, Take 20 mg by mouth daily., Disp: , Rfl:    doxycycline (VIBRA-TABS) 100 MG tablet, Take 1 tablet (100 mg total) by mouth 2 (two) times daily for 7 days., Disp: 14 tablet, Rfl: 0  esomeprazole (NEXIUM) 40 MG capsule, Take 1 capsule (40 mg total) by mouth every morning. MUST SCHEDULE OFFICE VISIT, Disp: 30 capsule, Rfl: 0   ezetimibe (ZETIA) 10 MG tablet, Take 10 mg by mouth at bedtime., Disp: , Rfl:    fenofibrate micronized (LOFIBRA) 134 MG capsule, Take 134 mg by mouth at bedtime., Disp: , Rfl:    fluticasone (FLONASE) 50 MCG/ACT nasal spray, PLACE 2 SPRAYS INTO BOTH NOSTRILS DAILY, Disp: 16 g, Rfl: 0   gabapentin (NEURONTIN) 300 MG capsule, Take 300 mg by mouth 2 (two) times  daily., Disp: , Rfl:    gabapentin (NEURONTIN) 600 MG tablet, Take 600 mg by mouth at bedtime., Disp: , Rfl:    GLYXAMBI 25-5 MG TABS, Take 1 tablet by mouth daily., Disp: , Rfl:    HUMALOG KWIKPEN 100 UNIT/ML KwikPen, Inject into the skin., Disp: , Rfl:    HUMULIN R U-500 KWIKPEN 500 UNIT/ML kwikpen, Inject 70 Units into the skin 3 (three) times daily with meals., Disp: , Rfl:    hydrOXYzine (ATARAX) 50 MG tablet, Take 50 mg by mouth 3 (three) times daily., Disp: , Rfl:    Insulin Aspart FlexPen (NOVOLOG) 100 UNIT/ML, Inject into the skin., Disp: , Rfl:    Insulin Pen Needle (PEN NEEDLES) 31G X 8 MM MISC, , Disp: , Rfl:    lamoTRIgine (LAMICTAL) 200 MG tablet, Take 200 mg by mouth 2 (two) times daily. , Disp: , Rfl:    linaclotide (LINZESS) 145 MCG CAPS capsule, TAKE 1 CAPSULE BY MOUTH DAILY BEFORE BREAKFAST (Patient taking differently: Take 145 mcg by mouth daily before breakfast.), Disp: 30 capsule, Rfl: 3   meclizine (ANTIVERT) 25 MG tablet, Take 1 tablet (25 mg total) by mouth 3 (three) times daily as needed for dizziness., Disp: 30 tablet, Rfl: 0   metoprolol tartrate (LOPRESSOR) 25 MG tablet, Take 1 tablet (25 mg total) by mouth 2 (two) times daily. NEED OV., Disp: 180 tablet, Rfl: 0   nitroGLYCERIN (NITROSTAT) 0.4 MG SL tablet, Place 1 tablet (0.4 mg total) under the tongue every 5 (five) minutes as needed for chest pain., Disp: 25 tablet, Rfl: 0   ondansetron (ZOFRAN-ODT) 4 MG disintegrating tablet, Take 1 tablet (4 mg total) by mouth every 8 (eight) hours as needed., Disp: 20 tablet, Rfl: 0   OneTouch Delica Lancets 30G MISC, by Does not apply route., Disp: , Rfl:    promethazine (PHENERGAN) 25 MG suppository, Place 1 suppository (25 mg total) rectally every 6 (six) hours as needed for nausea or vomiting., Disp: 12 each, Rfl: 0   QUEtiapine (SEROQUEL) 300 MG tablet, Take 300 mg by mouth at bedtime. Along with 200mg  dose for a total of 500mg , Disp: , Rfl:    SUMAtriptan (IMITREX) 100 MG  tablet, TAKE 1 TABLET AT ONSET OF MIGRAINE. MAY REPEAT IN 2 HOURS IF HEADACHE PERSISTS OR RECURS., Disp: 10 tablet, Rfl: 0   SYNTHROID 100 MCG tablet, Take 100 mcg by mouth daily., Disp: , Rfl:    TRULICITY 1.5 MG/0.5ML SOPN, Inject 1.5 mg into the skin once a week. (Wednesdays), Disp: , Rfl:    Ubrogepant (UBRELVY) 100 MG TABS, Take 1 tablet by mouth every other day., Disp: 16 tablet, Rfl: 5   Vibegron (GEMTESA) 75 MG TABS, Take 1 tablet (75 mg total) by mouth daily., Disp: 30 tablet, Rfl: 0   Allergies  Allergen Reactions   Mirtazapine Anaphylaxis    REACTION: closes throat   Morphine And Codeine     anaphylaxis  Sulfamethoxazole-Trimethoprim Rash    REACTION: Whelps all over   Amoxicillin Er Nausea And Vomiting    Pt doesn't remember problem with amoxicillin -  SE Has patient had a PCN reaction causing immediate rash, facial/tongue/throat swelling, SOB or lightheadedness with hypotension: No Has patient had a PCN reaction causing severe rash involving mucus membranes or skin necrosis: No Has patient had a PCN reaction that required hospitalization: No Has patient had a PCN reaction occurring within the last 10 years: no If all of the above answers are "NO", then may proceed with Cephalosporin use.   Codeine Itching    REACTION: itch, rash   Hydrocodone-Acetaminophen Rash    REACTION: rash   Keflex [Cephalexin] Rash    Pt does not remember having a reaction or rash, no airway involvement   Prasterone Nausea And Vomiting     Social History   Tobacco Use   Smoking status: Never   Smokeless tobacco: Never  Vaping Use   Vaping status: Never Used  Substance Use Topics   Alcohol use: No   Drug use: No      Chart Review Today: I personally reviewed active problem list, medication list, allergies, family history, social history, health maintenance, notes from last encounter, lab results, imaging with the patient/caregiver today.   Review of Systems  Constitutional:  Negative.   HENT: Negative.    Eyes: Negative.   Respiratory: Negative.    Cardiovascular: Negative.   Gastrointestinal: Negative.   Endocrine: Negative.   Genitourinary: Negative.   Musculoskeletal: Negative.   Skin: Negative.   Allergic/Immunologic: Negative.   Neurological: Negative.   Hematological: Negative.   Psychiatric/Behavioral: Negative.    All other systems reviewed and are negative.      Objective:   Vitals:   03/19/23 0839  BP: 108/62  Pulse: 82  Resp: 16  Temp: 97.9 F (36.6 C)  TempSrc: Oral  SpO2: 91%  Weight: 189 lb 8 oz (86 kg)  Height: 4\' 11"  (1.499 m)    Body mass index is 38.27 kg/m.  Physical Exam Vitals and nursing note reviewed.  Constitutional:      General: She is not in acute distress.    Appearance: Normal appearance. She is not ill-appearing, toxic-appearing or diaphoretic.  HENT:     Head: Normocephalic and atraumatic.     Right Ear: External ear normal.     Left Ear: External ear normal.     Nose: Nose normal.     Mouth/Throat:     Mouth: Mucous membranes are moist.     Pharynx: Oropharynx is clear.  Eyes:     General: No scleral icterus.       Right eye: No discharge.        Left eye: No discharge.     Conjunctiva/sclera: Conjunctivae normal.     Pupils: Pupils are equal, round, and reactive to light.  Cardiovascular:     Rate and Rhythm: Normal rate.     Pulses: Normal pulses.     Heart sounds: Normal heart sounds.  Pulmonary:     Effort: Pulmonary effort is normal. No respiratory distress.     Breath sounds: Normal breath sounds.  Abdominal:     General: Bowel sounds are normal.     Palpations: Abdomen is soft.  Skin:    General: Skin is warm.  Neurological:     Mental Status: She is alert.     Motor: No weakness.     Gait: Gait (smooth steady gait with rolling  walker) normal.  Psychiatric:        Mood and Affect: Mood normal.        Behavior: Behavior normal.      Results for orders placed or performed  during the hospital encounter of 03/16/23  CBC  Result Value Ref Range   WBC 5.8 4.0 - 10.5 K/uL   RBC 4.32 3.87 - 5.11 MIL/uL   Hemoglobin 12.6 12.0 - 15.0 g/dL   HCT 69.6 29.5 - 28.4 %   MCV 91.7 80.0 - 100.0 fL   MCH 29.2 26.0 - 34.0 pg   MCHC 31.8 30.0 - 36.0 g/dL   RDW 13.2 44.0 - 10.2 %   Platelets 218 150 - 400 K/uL   nRBC 0.0 0.0 - 0.2 %  Comprehensive metabolic panel  Result Value Ref Range   Sodium 145 135 - 145 mmol/L   Potassium 3.8 3.5 - 5.1 mmol/L   Chloride 111 98 - 111 mmol/L   CO2 26 22 - 32 mmol/L   Glucose, Bld 65 (L) 70 - 99 mg/dL   BUN 22 (H) 6 - 20 mg/dL   Creatinine, Ser 7.25 (H) 0.44 - 1.00 mg/dL   Calcium 8.8 (L) 8.9 - 10.3 mg/dL   Total Protein 6.0 (L) 6.5 - 8.1 g/dL   Albumin 3.6 3.5 - 5.0 g/dL   AST 41 15 - 41 U/L   ALT 36 0 - 44 U/L   Alkaline Phosphatase 41 38 - 126 U/L   Total Bilirubin 0.4 0.3 - 1.2 mg/dL   GFR, Estimated >36 >64 mL/min   Anion gap 8 5 - 15  Urinalysis, Routine w reflex microscopic -Urine, Clean Catch  Result Value Ref Range   Color, Urine YELLOW (A) YELLOW   APPearance HAZY (A) CLEAR   Specific Gravity, Urine 1.035 (H) 1.005 - 1.030   pH 5.0 5.0 - 8.0   Glucose, UA >=500 (A) NEGATIVE mg/dL   Hgb urine dipstick NEGATIVE NEGATIVE   Bilirubin Urine NEGATIVE NEGATIVE   Ketones, ur NEGATIVE NEGATIVE mg/dL   Protein, ur NEGATIVE NEGATIVE mg/dL   Nitrite NEGATIVE NEGATIVE   Leukocytes,Ua NEGATIVE NEGATIVE   RBC / HPF 0-5 0 - 5 RBC/hpf   WBC, UA 6-10 0 - 5 WBC/hpf   Bacteria, UA NONE SEEN NONE SEEN   Squamous Epithelial / HPF 0-5 0 - 5 /HPF   Mucus PRESENT   CBG monitoring, ED  Result Value Ref Range   Glucose-Capillary 57 (L) 70 - 99 mg/dL   Comment 1 Notify RN    Comment 2 Document in Chart   CBG monitoring, ED  Result Value Ref Range   Glucose-Capillary 86 70 - 99 mg/dL  CBG monitoring, ED  Result Value Ref Range   Glucose-Capillary 108 (H) 70 - 99 mg/dL   Comment 1 Notify RN    Comment 2 Document in Chart         Assessment & Plan:      ICD-10-CM   1. Vertigo  R42 meclizine (ANTIVERT) 25 MG tablet    Ambulatory referral to Physical Therapy   reproducible with head movement and getting out of bed, lasts several minutes to 30 min, meclizine helping    2. Multiple falls  R29.6 meclizine (ANTIVERT) 25 MG tablet    Ambulatory referral to Physical Therapy   in the past week ED visit x 2 did not require admission, improving some with meclizine, pt for falls, balance/mobility and vertigo episodes    3. Hospital discharge follow-up  Z09 meclizine (ANTIVERT) 25 MG tablet    ondansetron (ZOFRAN-ODT) 4 MG disintegrating tablet   see below    4. Impaired functional mobility, balance, gait, and endurance  Z74.09 meclizine (ANTIVERT) 25 MG tablet    Ambulatory referral to Physical Therapy   multiple falls    5. Hypoglycemia  E16.2 GVOKE HYPOPEN 2-PACK 1 MG/0.2ML SOAJ   low blood sugar episodes at home and with last ED labs, she tx with OJ, discussed deceasing basal insulin dose and holding SSI, hypopen rx    6. Hypothyroidism, unspecified type  E03.9 Ambulatory referral to Endocrinology   needs to est with new endo - encouraged them to f/up with PCP and [facility] to ensure she does not run out of meds    7. Insulin dependent type 2 diabetes mellitus (HCC)  E11.9 Ambulatory referral to Endocrinology   Z79.4 GVOKE HYPOPEN 2-PACK 1 MG/0.2ML SOAJ   on basal and SSI, needs to est with new endo - her MD is retiring    8. Encounter for examination following treatment at hospital  Z09    ER visit x 2 with hx of same, even being admitted last year for vertigo and falls, labs and ED records and CT results all reviewed today with pt     Return for f/up with PT for vertigo/balance and PCP for endo issues.,.    Danelle Berry, PA-C 03/19/23 9:07 AM

## 2023-03-22 ENCOUNTER — Telehealth: Payer: Self-pay

## 2023-03-22 NOTE — Telephone Encounter (Signed)
Transition Care Management Unsuccessful Follow-up Telephone Call  Date of discharge and from where:  [place] 7/12  Attempts:  1st Attempt  Reason for unsuccessful TCM follow-up call:  No answer/busy   Lenard Forth [facility] Guide, [facility] [phone] [address] [place], [place], [place] [address] Phone: [phone] Email: [email or web address]

## 2023-03-22 NOTE — Telephone Encounter (Signed)
Transition Care Management Unsuccessful Follow-up Telephone Call  Date of discharge and from where:  [place] 7/12  Attempts:  2nd Attempt  Reason for unsuccessful TCM follow-up call:  No answer/busy   Lenard Forth [facility] Guide, [facility] [phone] [address] [place], [place], [place] [address] Phone: [phone] Email: [email or web address]

## 2023-03-24 ENCOUNTER — Other Ambulatory Visit: Payer: Self-pay | Admitting: Gastroenterology

## 2023-03-27 ENCOUNTER — Other Ambulatory Visit: Payer: Self-pay

## 2023-03-27 ENCOUNTER — Emergency Department: Payer: Medicare Other

## 2023-03-27 ENCOUNTER — Ambulatory Visit (INDEPENDENT_AMBULATORY_CARE_PROVIDER_SITE_OTHER): Payer: Medicare Other | Admitting: Physician Assistant

## 2023-03-27 ENCOUNTER — Emergency Department
Admission: EM | Admit: 2023-03-27 | Discharge: 2023-03-27 | Disposition: A | Discharge status: Home / Self Care | Payer: Medicare Other | Source: Home / Self Care | Attending: Emergency Medicine | Admitting: Emergency Medicine

## 2023-03-27 ENCOUNTER — Encounter: Payer: Self-pay | Admitting: Physician Assistant

## 2023-03-27 VITALS — BP 117/72 | HR 74 | Ht 59.0 in | Wt 189.0 lb

## 2023-03-27 DIAGNOSIS — S0990XA Unspecified injury of head, initial encounter: Secondary | ICD-10-CM | POA: Insufficient documentation

## 2023-03-27 DIAGNOSIS — M545 Low back pain, unspecified: Secondary | ICD-10-CM | POA: Insufficient documentation

## 2023-03-27 DIAGNOSIS — W19XXXA Unspecified fall, initial encounter: Secondary | ICD-10-CM

## 2023-03-27 DIAGNOSIS — M47816 Spondylosis without myelopathy or radiculopathy, lumbar region: Secondary | ICD-10-CM | POA: Diagnosis not present

## 2023-03-27 DIAGNOSIS — R42 Dizziness and giddiness: Secondary | ICD-10-CM | POA: Insufficient documentation

## 2023-03-27 DIAGNOSIS — N3946 Mixed incontinence: Secondary | ICD-10-CM

## 2023-03-27 DIAGNOSIS — W0110XA Fall on same level from slipping, tripping and stumbling with subsequent striking against unspecified object, initial encounter: Secondary | ICD-10-CM | POA: Diagnosis not present

## 2023-03-27 DIAGNOSIS — G238 Other specified degenerative diseases of basal ganglia: Secondary | ICD-10-CM | POA: Diagnosis not present

## 2023-03-27 LAB — CBC
HCT: 39.5 % (ref 36.0–46.0)
Hemoglobin: 12.6 g/dL (ref 12.0–15.0)
MCH: 29.2 pg (ref 26.0–34.0)
MCHC: 31.9 g/dL (ref 30.0–36.0)
MCV: 91.6 fL (ref 80.0–100.0)
Platelets: 198 10*3/uL (ref 150–400)
RBC: 4.31 MIL/uL (ref 3.87–5.11)
RDW: 13.8 % (ref 11.5–15.5)
WBC: 6.1 10*3/uL (ref 4.0–10.5)
nRBC: 0 % (ref 0.0–0.2)

## 2023-03-27 LAB — BASIC METABOLIC PANEL
Anion gap: 9 (ref 5–15)
BUN: 26 mg/dL — ABNORMAL HIGH (ref 6–20)
CO2: 27 mmol/L (ref 22–32)
Calcium: 9.3 mg/dL (ref 8.9–10.3)
Chloride: 103 mmol/L (ref 98–111)
Creatinine, Ser: 1.1 mg/dL — ABNORMAL HIGH (ref 0.44–1.00)
GFR, Estimated: 58 mL/min — ABNORMAL LOW (ref >60)
Glucose, Bld: 163 mg/dL — ABNORMAL HIGH (ref 70–99)
Potassium: 4.2 mmol/L (ref 3.5–5.1)
Sodium: 139 mmol/L (ref 135–145)

## 2023-03-27 MED ORDER — CETIRIZINE HCL 10 MG PO TABS: 10.0000 mg | ORAL_TABLET | Freq: Every day | ORAL | 0 refills | Status: DC

## 2023-03-27 MED ORDER — METHOCARBAMOL 500 MG PO TABS: 500.0000 mg | ORAL_TABLET | Freq: Four times a day (QID) | ORAL | 0 refills | Status: DC

## 2023-03-27 MED ORDER — MECLIZINE HCL 50 MG PO TABS: 50.0000 mg | ORAL_TABLET | Freq: Three times a day (TID) | ORAL | 2 refills | Status: DC | PRN

## 2023-03-27 MED ORDER — PREDNISONE 50 MG PO TABS: 50.0000 mg | ORAL_TABLET | Freq: Every day | ORAL | 0 refills | Status: DC

## 2023-03-27 NOTE — ED Triage Notes (Signed)
Pt here via [facility] after a fall. Pt was dx with vertigo x2 weeks ago, pt took meclizine this morning. Pt fell and hit her head on the tc stand. Pt c/o back pain, has a knot on the right side of her head.    78 96% 2L (3L at night) 18 30-co2 120/67 160-cbg

## 2023-03-27 NOTE — ED Notes (Signed)
See triage note Presents s/p fall  States she has been dizzy   and then fell  States she did hit her head  No loc  But also having some pain to her back

## 2023-03-27 NOTE — ED Triage Notes (Signed)
Pt to ED [facility] for dizziness causing fall today. Reports hit head, denies LOC. +blood thinners.  Pt alert and oriented, NAD noted  Taking meds for vertigo

## 2023-03-27 NOTE — ED Provider Notes (Signed)
[facility] Provider Note  Patient Contact: 4:01 PM (approximate)   History   Fall and Dizziness   HPI  [name] is a 57 y.o. female who presents emergency department complaining of vertigo and fall.  Patient states that she has been dealing with vertigo off and on for the last month or 2.  She does have meclizine as needed, states that today she had attack of vertigo.  Time she realized what was happening she lost her balance, fell forward, tried to brace herself and ultimately fell back hitting her head.  She is also complaining of some lower back pain, has a history of chronic back issues but states that the pain feels slightly worse.  She says that it feels like muscle tightness denies any radicular symptoms, bowel or bladder dysfunction, saddle anesthesia or paresthesias.  Patient states that the vertigo seems improved at this time after she took some meclizine at home.  Patient denies any visual changes, slurred speech, unilateral weakness following her head injury.     Physical Exam   Triage Vital Signs: ED Triage Vitals  Encounter Vitals Group     BP 03/27/23 1305 108/76     Systolic BP Percentile --      Diastolic BP Percentile --      Pulse Rate 03/27/23 1305 77     Resp 03/27/23 1305 20     Temp 03/27/23 1305 98.5 F (36.9 C)     Temp src --      SpO2 03/27/23 1305 94 %     Weight 03/27/23 1306 186 lb (84.4 kg)     Height 03/27/23 1306 4\' 11"  (1.499 m)     Head Circumference --      Peak Flow --      Pain Score 03/27/23 1306 8     Pain Loc --      Pain Education --      Exclude from Growth Chart --     Most recent vital signs: Vitals:   03/27/23 1305 03/27/23 1701  BP: 108/76 (!) 138/57  Pulse: 77 80  Resp: 20 18  Temp: 98.5 F (36.9 C) 98.2 F (36.8 C)  SpO2: 94% 93%     General: Alert and in no acute distress. Eyes:  PERRL. EOMI. Head: No acute traumatic findings.  No areas of ecchymosis, edema, battle signs, raccoon  eyes, serosanguineous fluid drainage from the ears or nares. ENT:      Ears: TMs are slightly bulging with no injection.      Nose: No congestion/rhinnorhea.  Mild bogginess of the turbinates      Mouth/Throat: Mucous membranes are moist. Neck: No stridor. No cervical spine tenderness to palpation.  Cardiovascular:  Good peripheral perfusion Respiratory: Normal respiratory effort without tachypnea or retractions. Lungs CTAB. Good air entry to the bases with no decreased or absent breath sounds. Musculoskeletal: Full range of motion to all extremities.  Neurologic:  No gross focal neurologic deficits are appreciated.  Skin:   No rash noted Other:   ED Results / Procedures / Treatments   Labs (all labs ordered are listed, but only abnormal results are displayed) Labs Reviewed  BASIC METABOLIC PANEL - Abnormal; Notable for the following components:      Result Value   Glucose, Bld 163 (*)    BUN 26 (*)    Creatinine, Ser 1.10 (*)    GFR, Estimated 58 (*)    All other components within normal limits  CBC     EKG  ED ECG REPORT I, Delorise Royals Armstead Heiland,  personally viewed and interpreted this ECG.   Date: 03/27/2023  EKG Time: 1315 hrs.  Rate: 74 bpm  Rhythm: unchanged from previous tracings, normal sinus rhythm, compared to previous EKG from 03/19/2023 shortening QTc  Axis: Left axis deviation.  Intervals:none  ST&T Change: No ST elevation or depression on  Normal sinus rhythm.  No STEMI.    RADIOLOGY  I personally viewed, evaluated, and interpreted these images as part of my medical decision making, as well as reviewing the written report by the radiologist.  ED Provider Interpretation: No acute intracranial abnormality, no skull fracture.  Lumbar films reveal no acute compression fracture or other concerning findings.  DG Lumbar Spine 2-3 Views  Result Date: 03/27/2023 CLINICAL DATA:  Fall.  Low back pain. EXAM: LUMBAR SPINE - 2-3 VIEW COMPARISON:  Lumbar spine  radiographs 04/28/2020. FINDINGS: Three views of the lumbar spine. No evidence of lumbar spine fracture. Normal alignment. Disc spaces are preserved. Lower lumbar facet arthropathy. IMPRESSION: No evidence of lumbar spine fracture. Lower lumbar facet arthropathy. Electronically Signed   By: Orvan Falconer M.D.   On: 03/27/2023 16:58   CT HEAD WO CONTRAST ( )  Result Date: 03/27/2023 CLINICAL DATA:  Trauma, fall EXAM: CT HEAD WITHOUT CONTRAST TECHNIQUE: Contiguous axial images were obtained from the base of the skull through the vertex without intravenous contrast. RADIATION DOSE REDUCTION: This exam was performed according to the departmental dose-optimization program which includes automated exposure control, adjustment of the mA and/or kV according to patient size and/or use of iterative reconstruction technique. COMPARISON:  03/15/2023 FINDINGS: Brain: No acute intracranial findings are seen. There are no signs of bleeding within the cranium. Calcifications are seen in basal ganglia. Cortical sulci are prominent. Vascular: Unremarkable. Skull: No fracture is seen in calvarium. Sinuses/Orbits: There is mucosal thickening in left side of sphenoid sinus. There is mild mucosal thickening in the ethmoid sinuses. Other: There is increased amount of CSF in the sella suggesting partial empty sella. IMPRESSION: No acute intracranial findings are seen in noncontrast CT brain. Mild chronic ethmoid and sphenoid sinusitis. Electronically Signed   By: Ernie Avena M.D.   On: 03/27/2023 14:51    PROCEDURES:  Critical Care performed: No  Procedures   MEDICATIONS ORDERED IN ED: Medications - No data to display   IMPRESSION / MDM / ASSESSMENT AND PLAN / ED COURSE  I reviewed the triage vital signs and the nursing notes.                                 Differential diagnosis includes, but is not limited to, fall, vertigo, CVA, skull fracture, intracranial hemorrhage, lumbar spine compression  fracture, herniated disc   Patient's presentation is most consistent with acute presentation with potential threat to life or bodily function.   Patient's diagnosis is consistent with vertigo, fall.  Patient presents emergency department after experiencing vertigo symptoms.  Patient has a known history of vertigo, takes meclizine for flares.  Patient states that she became dizzy, started falling forward, tried to brace herself which ultimately caused her to fall backwards and hit her head.  No loss consciousness.  Imaging of the head was reassuring no acute intracranial abnormality or skull fracture.  Visualization of the lumbar spine x-ray reveals no concerning findings.  Patient will have symptom control meds for her back to include steroid and muscle  relaxer, prescribe meclizine as patient has run out, also given the chronic sinusitis  CT feel that patient would benefit from a daily antihistamine to try to lessen chance that this would be secondary to eustachian tube dysfunction.  Patient agreeable with this plan.  Follow-up primary care as needed..  Patient is given ED precautions to return to the ED for any worsening or new symptoms.     FINAL CLINICAL IMPRESSION(S) / ED DIAGNOSES   Final diagnoses:  Fall, initial encounter  Vertigo     Rx / DC Orders   ED Discharge Orders          Ordered    cetirizine (ZYRTEC) 10 MG tablet  Daily        03/27/23 1805    meclizine (ANTIVERT) 50 MG tablet  3 times daily PRN        03/27/23 1805    methocarbamol (ROBAXIN) 500 MG tablet  4 times daily        03/27/23 1805    predniSONE (DELTASONE) 50 MG tablet  Daily with breakfast        03/27/23 1805             Note:  This document was prepared using Dragon voice recognition software and may include unintentional dictation errors.   Lanette Hampshire 03/27/23 1809    Pilar Jarvis, MD 03/27/23 [phone]

## 2023-03-27 NOTE — Progress Notes (Signed)
Patient presents to clinic today for InterStim management. She reports device is no longer effective. Dr. Sherron Monday plans to exchange it, timing TBD.  Impedence check results: None Recent falls: Yes, twice Battery life: Good  Arrival setting: P4, 1.3. No sensation. Adjusted to: P4, 2.0. Light flutter.  Carman Ching, PA-C  03/27/23 9:58 AM

## 2023-04-03 ENCOUNTER — Other Ambulatory Visit: Payer: Self-pay | Admitting: Family Medicine

## 2023-04-03 DIAGNOSIS — J3489 Other specified disorders of nose and nasal sinuses: Secondary | ICD-10-CM

## 2023-04-04 ENCOUNTER — Telehealth: Payer: Self-pay

## 2023-04-04 NOTE — Telephone Encounter (Signed)
Transition Care Management Unsuccessful Follow-up Telephone Call  Date of discharge and from where:  [place] 7/23  Attempts:  1st Attempt  Reason for unsuccessful TCM follow-up call:  No answer/busy   Lenard Forth [facility] Guide, [facility] [phone] [address] [place], [place], [place] [address] Phone: [phone] Email: [email or web address]

## 2023-04-04 NOTE — Telephone Encounter (Signed)
Transition Care Management Unsuccessful Follow-up Telephone Call  Date of discharge and from where:  [name] 7/23  Attempts:  2nd Attempt  Reason for unsuccessful TCM follow-up call:  No answer/busy

## 2023-04-09 ENCOUNTER — Telehealth: Payer: Self-pay | Admitting: Urology

## 2023-04-09 NOTE — Telephone Encounter (Signed)
This is the patient that I spoke with you about earlier this morning. Patient called regarding scheduling Interstim replacement with Dr. Sherron Monday.

## 2023-04-11 DIAGNOSIS — F411 Generalized anxiety disorder: Secondary | ICD-10-CM | POA: Diagnosis not present

## 2023-04-11 DIAGNOSIS — F250 Schizoaffective disorder, bipolar type: Secondary | ICD-10-CM | POA: Diagnosis not present

## 2023-04-13 ENCOUNTER — Other Ambulatory Visit: Payer: Self-pay

## 2023-04-13 DIAGNOSIS — T83110A Breakdown (mechanical) of urinary electronic stimulator device, initial encounter: Secondary | ICD-10-CM

## 2023-04-13 DIAGNOSIS — N3941 Urge incontinence: Secondary | ICD-10-CM

## 2023-04-13 NOTE — Progress Notes (Signed)
Surgical Physician Order Form [facility] [place]  * Scheduling expectation :  05/21/2023  *Length of Case:   *Clearance needed: yes, Cardiac  *Anticoagulation Instructions: Hold all anticoagulants  *Aspirin Instructions: Hold Aspirin  *Post-op visit Date/Instructions:   2 weeks post op  *Diagnosis:  Malfunctioning Interstim, Refractory Urge Incontinence  *Procedure:  Removal of Interstim,   Stage One and Stage Two Interstim Implant, Impedance check    Additional orders: N/A  -Admit type: OUTpatient  -Anesthesia: MAC  -VTE Prophylaxis Standing Order SCD's       Other:   -Standing Lab Orders Per Anesthesia    Lab other: None  -Standing Test orders EKG/Chest x-ray per Anesthesia       Test other:   - Medications:  Gentamicin per pharmacy, Vancomycin per Pharmacy  -Other orders:  N/A

## 2023-04-17 ENCOUNTER — Emergency Department: Payer: Medicare Other

## 2023-04-17 ENCOUNTER — Encounter: Payer: Self-pay | Admitting: Emergency Medicine

## 2023-04-17 ENCOUNTER — Emergency Department
Admission: EM | Admit: 2023-04-17 | Discharge: 2023-04-17 | Disposition: A | Discharge status: Home / Self Care | Payer: Medicare Other | Attending: Emergency Medicine | Admitting: Emergency Medicine

## 2023-04-17 ENCOUNTER — Other Ambulatory Visit: Payer: Self-pay

## 2023-04-17 DIAGNOSIS — W1830XA Fall on same level, unspecified, initial encounter: Secondary | ICD-10-CM | POA: Diagnosis not present

## 2023-04-17 DIAGNOSIS — N189 Chronic kidney disease, unspecified: Secondary | ICD-10-CM | POA: Diagnosis not present

## 2023-04-17 DIAGNOSIS — S0990XA Unspecified injury of head, initial encounter: Secondary | ICD-10-CM | POA: Diagnosis not present

## 2023-04-17 DIAGNOSIS — W19XXXA Unspecified fall, initial encounter: Secondary | ICD-10-CM

## 2023-04-17 DIAGNOSIS — I2510 Atherosclerotic heart disease of native coronary artery without angina pectoris: Secondary | ICD-10-CM | POA: Insufficient documentation

## 2023-04-17 DIAGNOSIS — R42 Dizziness and giddiness: Secondary | ICD-10-CM | POA: Diagnosis not present

## 2023-04-17 DIAGNOSIS — S7001XA Contusion of right hip, initial encounter: Secondary | ICD-10-CM | POA: Diagnosis not present

## 2023-04-17 DIAGNOSIS — E1122 Type 2 diabetes mellitus with diabetic chronic kidney disease: Secondary | ICD-10-CM | POA: Diagnosis not present

## 2023-04-17 DIAGNOSIS — I700 Atherosclerosis of aorta: Secondary | ICD-10-CM | POA: Diagnosis not present

## 2023-04-17 DIAGNOSIS — I129 Hypertensive chronic kidney disease with stage 1 through stage 4 chronic kidney disease, or unspecified chronic kidney disease: Secondary | ICD-10-CM | POA: Insufficient documentation

## 2023-04-17 DIAGNOSIS — S199XXA Unspecified injury of neck, initial encounter: Secondary | ICD-10-CM | POA: Diagnosis not present

## 2023-04-17 DIAGNOSIS — G238 Other specified degenerative diseases of basal ganglia: Secondary | ICD-10-CM | POA: Diagnosis not present

## 2023-04-17 DIAGNOSIS — Z043 Encounter for examination and observation following other accident: Secondary | ICD-10-CM | POA: Diagnosis not present

## 2023-04-17 MED ORDER — OXYCODONE-ACETAMINOPHEN 5-325 MG PO TABS
1.0000 | ORAL_TABLET | Freq: Once | ORAL | Status: AC
  Administered 2023-04-17: 1 via ORAL
  Filled 2023-04-17: qty 1

## 2023-04-17 MED ORDER — MECLIZINE HCL 25 MG PO TABS
25.0000 mg | ORAL_TABLET | Freq: Once | ORAL | Status: AC
  Administered 2023-04-17: 25 mg via ORAL
  Filled 2023-04-17: qty 1

## 2023-04-17 NOTE — ED Provider Notes (Signed)
[facility] Provider Note    Event Date/Time   First MD Initiated Contact with Patient 04/17/23 1152     (approximate)   History   Fall   HPI  JASMAINE CROGHAN is a 57 y.o. female with history of, diabetes, hypertension, CAD, CKD among other chronic medical problems presents emergency department after a fall.  Patient states she is fallen twice due to her dizziness.  States has a history of vertigo and that she took her meclizine this morning but has not helped.  Is complaining of right hip pain.  Denies lumbar spine pain.  Denies upper extremity pain.  No numbness or tingling.  No vomiting.  No LOC.  Patient is on blood thinners      Physical Exam   Triage Vital Signs: ED Triage Vitals  Encounter Vitals Group     BP 04/17/23 1111 100/78     Systolic BP Percentile --      Diastolic BP Percentile --      Pulse Rate 04/17/23 1111 70     Resp 04/17/23 1111 17     Temp 04/17/23 1111 98 F (36.7 C)     Temp Source 04/17/23 1111 Oral     SpO2 04/17/23 1108 95 %     Weight 04/17/23 1112 187 lb 6.3 oz (85 kg)     Height 04/17/23 1112 4\' 11"  (1.499 m)     Head Circumference --      Peak Flow --      Pain Score 04/17/23 1112 5     Pain Loc --      Pain Education --      Exclude from Growth Chart --     Most recent vital signs: Vitals:   04/17/23 1108 04/17/23 1111  BP:  100/78  Pulse:  70  Resp:  17  Temp:  98 F (36.7 C)  SpO2: 95% 98%     General: Awake, no distress.   CV:  Good peripheral perfusion. regular rate and  rhythm Resp:  Normal effort. Lungs cta Abd:  No distention.   Other:  Lumbar spine nontender, right hip tender to palpation, C-spine mildly tender, cranial nerves II through XII grossly intact   ED Results / Procedures / Treatments   Labs (all labs ordered are listed, but only abnormal results are displayed) Labs Reviewed - No data to display   EKG     RADIOLOGY CT of the head and cervical spine, x-ray of the  right hip    PROCEDURES:   Procedures   MEDICATIONS ORDERED IN ED: Medications  meclizine (ANTIVERT) tablet 25 mg (25 mg Oral Given 04/17/23 1230)  oxyCODONE-acetaminophen (PERCOCET/ROXICET) 5-325 MG per tablet 1 tablet (1 tablet Oral Given 04/17/23 1230)     IMPRESSION / MDM / ASSESSMENT AND PLAN / ED COURSE  I reviewed the triage vital signs and the nursing notes.                              Differential diagnosis includes, but is not limited to, subdural, subarachnoid, fracture, contusion, strain  Patient's presentation is most consistent with acute presentation with potential threat to life or bodily function.   CT of the head and cervical spine, x-ray of the right hip  Patient was given Antivert 25 mg p.o. for dizziness and oxycodone 5/325 p.o. for pain.  CT of the head and C-spine, did independently review interpreted radiologist read  as being negative for any acute abnormality  X-ray of the right hip was independently reviewed interpreted by me as being negative for any fracture  I did explain the findings to the patient.  She feels better after the medication.  Called her husband to pick her up.  She is in agreement with treatment plan.  Follow-up with her regular doctor.  Return for worsening.  Discharged in stable condition.      FINAL CLINICAL IMPRESSION(S) / ED DIAGNOSES   Final diagnoses:  Fall, initial encounter  Minor head injury, initial encounter  Contusion of right hip, initial encounter     Rx / DC Orders   ED Discharge Orders     None        Note:  This document was prepared using Dragon voice recognition software and may include unintentional dictation errors.    Faythe Ghee, PA-C 04/17/23 1344    Jene Every, MD 04/17/23 [phone]

## 2023-04-17 NOTE — ED Notes (Signed)
See triage note  Presents s/p falls  States she fell twice today today  Has been slightly dizzy

## 2023-04-17 NOTE — ED Triage Notes (Signed)
Pt sts that she has been falling today twice. Pt complains of right hip pain. Pt is on blood thinners. Pt A/Ox4 at this time, NAD. Pt sts that she has vertigo

## 2023-04-18 ENCOUNTER — Ambulatory Visit: Payer: Medicare Other | Admitting: Family Medicine

## 2023-04-20 ENCOUNTER — Telehealth: Payer: Self-pay

## 2023-04-20 NOTE — Progress Notes (Signed)
  Phone Number: [phone] for Surgical Coordinator Fax Number: [phone]  REQUEST FOR SURGICAL CLEARANCE     Date: Date: 05/21/2023  Faxed to: Dr. Alba Cory, MD  Surgeon: Dr. Alfredo Martinez, MD     Date of Surgery: 05/21/2023  Operation: Removal of Interstim Implant, Stage One and Two Interstim Placement with Impedance Check   Anesthesia Type: MAC   Diagnosis: Urge Incontinence, Malfunctioning Interstim  Patient Requires:   Medical Clearance : Yes  Reason: Will need patient to hold Plavix for 5 days prior to surgery  Risk Assessment:    Low   []       Moderate   []     High   []           This patient is optimized for surgery  YES []       NO   []    I recommend further assessment/workup prior to surgery. YES []      NO  []   Appointment scheduled for: _______________________   Further recommendations: ____________________________________     Physician Signature:__________________________________   Printed Name: ________________________________________   Date: _________________

## 2023-04-20 NOTE — Telephone Encounter (Signed)
Per Dr. Sherron Monday, Patient is to be scheduled for Removal of Interstim Implant, Stage One and Two Interstim Placement with Impedance Check   [name] was contacted and possible surgical dates were discussed, Monday September 16th, 2024 was agreed upon for surgery.   Patient was directed to call [phone] between 1-3pm the day before surgery to find out surgical arrival time.  Instructions were given not to eat or drink from midnight on the night before surgery and have a driver for the day of surgery. On the surgery day patient was instructed to enter through the [facility] entrance of [facility] report the Same Day Surgery desk.   Pre-Admit Testing will be in contact via phone to set up an interview with the anesthesia team to review your history and medications prior to surgery.   Reminder of this information was sent via MAILED to the patient.   Patient is to hold anticoag's per Dr. Sherron Monday.  Currently taking Plavix by Dr. Carlynn Purl Clearance requested to stop 5days prior to surgery, clearance form was faxed to Dr. Carlynn Purl. Awaiting response

## 2023-04-20 NOTE — Telephone Encounter (Signed)
Left voice mail to call and pt and Per Dr Carlynn Purl set up a surgical Clearance appt for pt. Asked that they give [place] a call to set this up

## 2023-04-20 NOTE — Progress Notes (Addendum)
   [facility] Surgical Posting Form  Surgery Date: Date: 07/16/2023  Surgeon: Dr. Alfredo Martinez, MD  Inpt ( No  )   Outpt (Yes)   Obs ( No  )   Diagnosis: T83.110A Malfunctioning Interstim, N39.41 Urge Incontinence  -CPT: [id number], [id number], [id number], [id number], [id number], [phone]  Surgery: Removal of Interstim Implant, Stage One and Two Interstim Placement with Impedance Check  Stop Anticoagulations: Yes, will need to hold Plavix  Cardiac/Medical/Pulmonary Clearance needed: yes  Clearance needed from Dr: Carlynn Purl  Clearance request Received on 06/20/2023  *Orders entered into EPIC  Date: 06/22/23   *Case booked in EPIC  Date: 06/21/2023  *Notified pt of Surgery: Date: 06/21/2023  PRE-OP UA & CX: no  *Placed into Prior Authorization Work Que Date: 06/22/23  Assistant/laser/rep:Yes, [employer] Rep to be present for case. Confirmed.

## 2023-04-21 ENCOUNTER — Other Ambulatory Visit: Payer: Self-pay | Admitting: Physician Assistant

## 2023-04-23 ENCOUNTER — Telehealth: Payer: Self-pay | Admitting: *Deleted

## 2023-04-23 NOTE — Telephone Encounter (Signed)
Transition Care Management Unsuccessful Follow-up Telephone Call  Date of discharge and from where:  [facility]  04/17/2023  Attempts:  1st Attempt  Reason for unsuccessful TCM follow-up call:  Unable to reach patient asked I call back tomorrow

## 2023-05-08 IMAGING — CT CT HEAD W/O CM
4 series · 17 of 47 positions shown, 19 images · non-contrast
Comparison: 05/18/2020

CLINICAL DATA: Slurred speech



[Series 2: head bone · axial · 0.41mm/px · z∈[+389,+445]mm · 4 of 80 slices shown]
[im 8/80  bone]
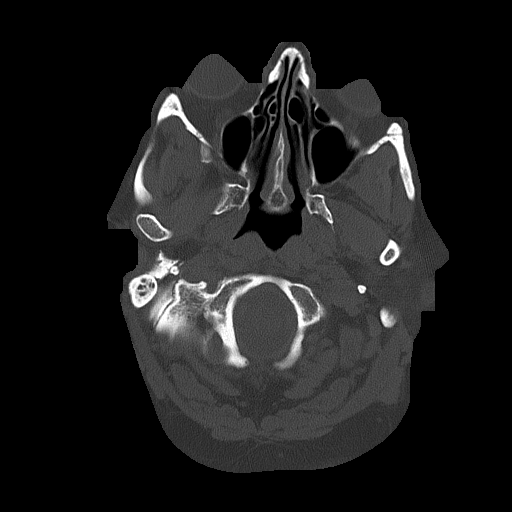
[im 16/80  bone]
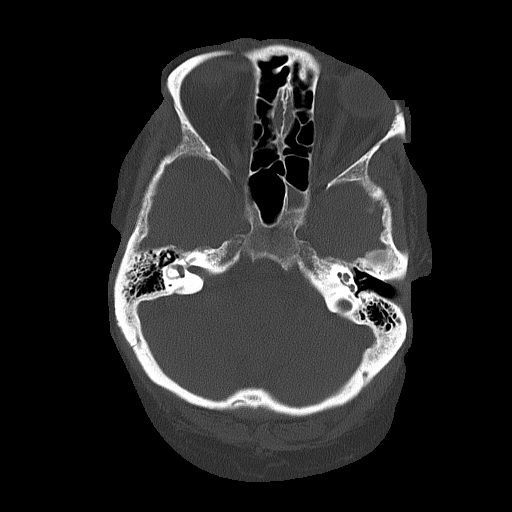
[im 24/80  bone]
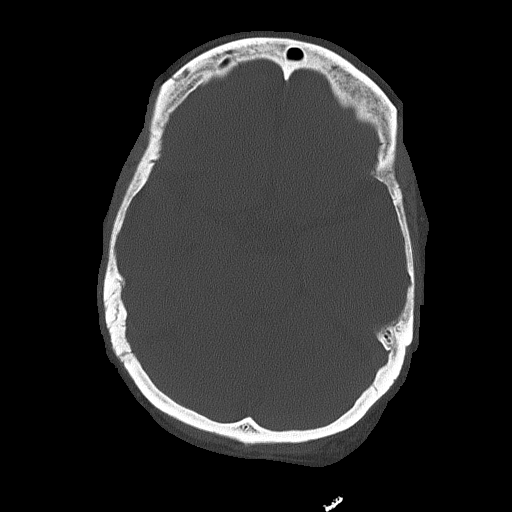
[im 36/80  bone]
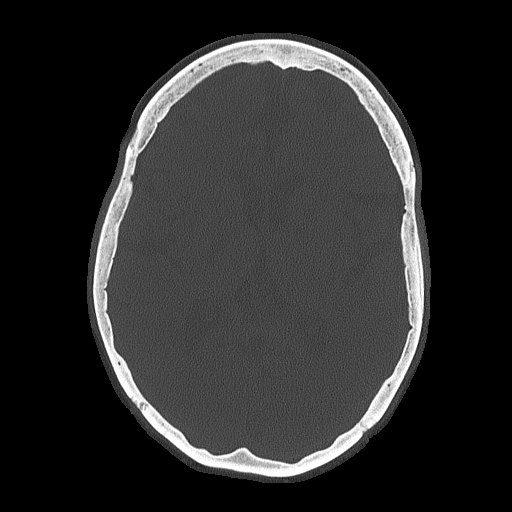

[Series 3: head wo · axial · 0.41mm/px · z∈[+390,+510]mm · 7 of 32 slices shown, 9 images]
[im 4/32  brain]
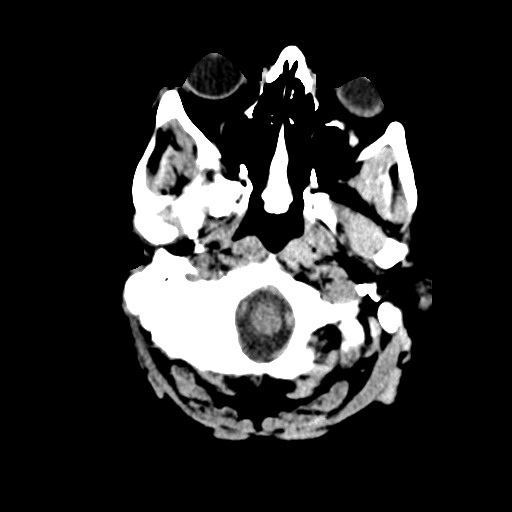
[im 4/32  bone]
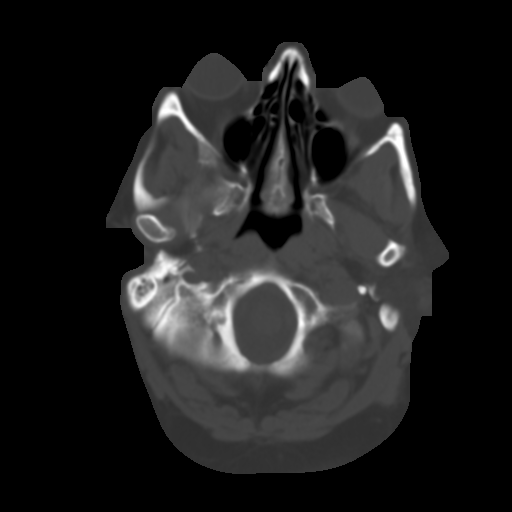
[im 8/32  brain]
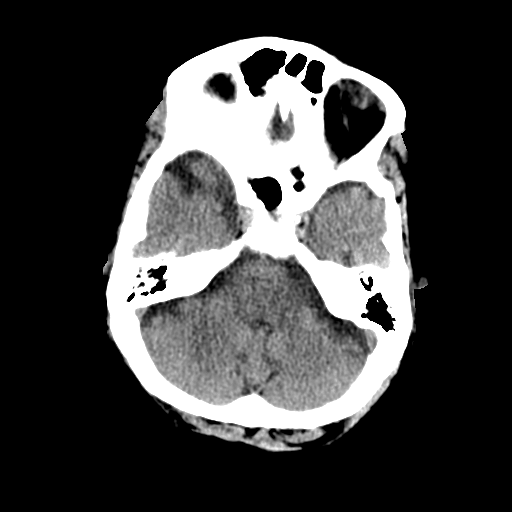
[im 12/32  brain]
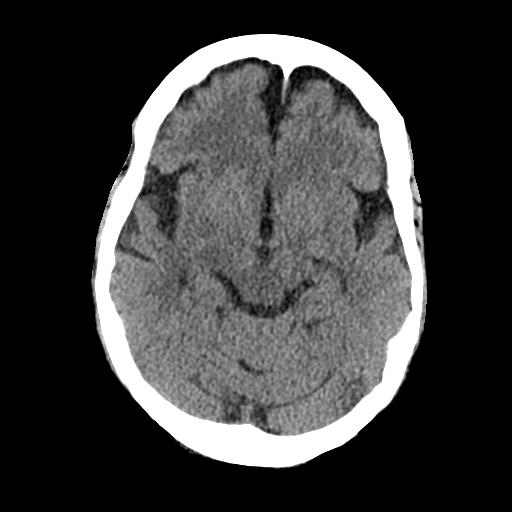
[im 16/32  brain]
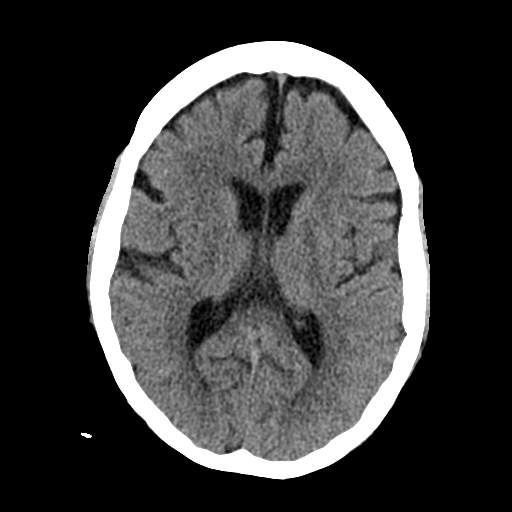
[im 20/32  brain]
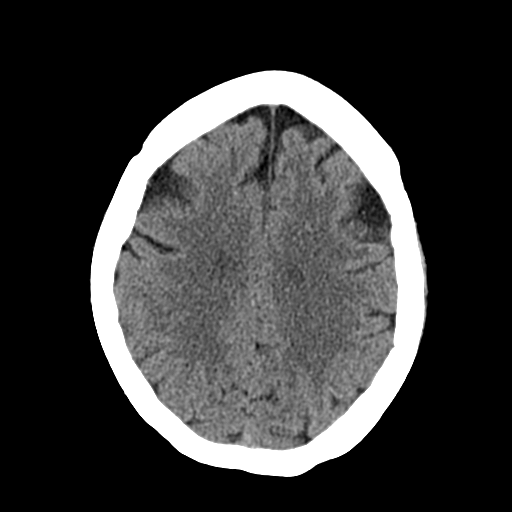
[im 20/32  bone]
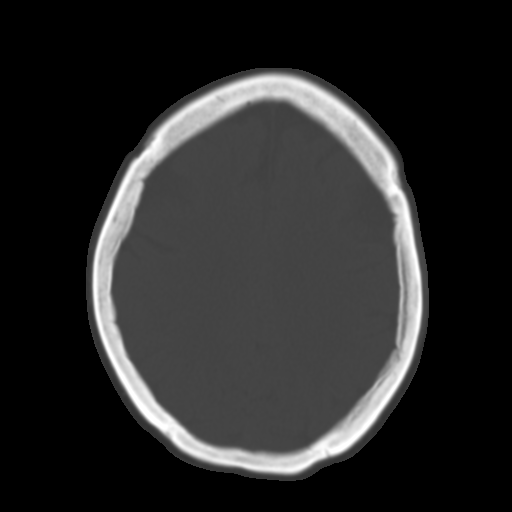
[im 24/32  brain]
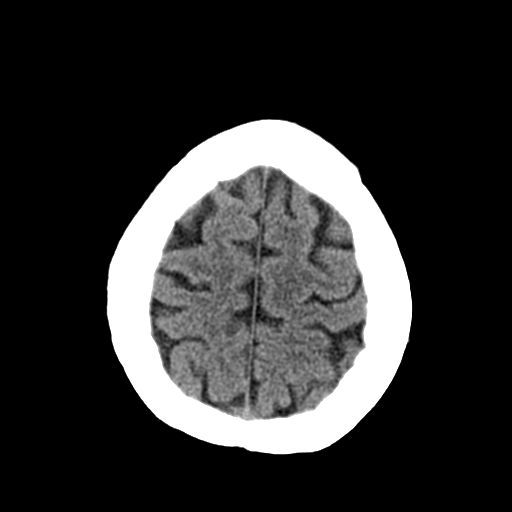
[im 28/32  brain]
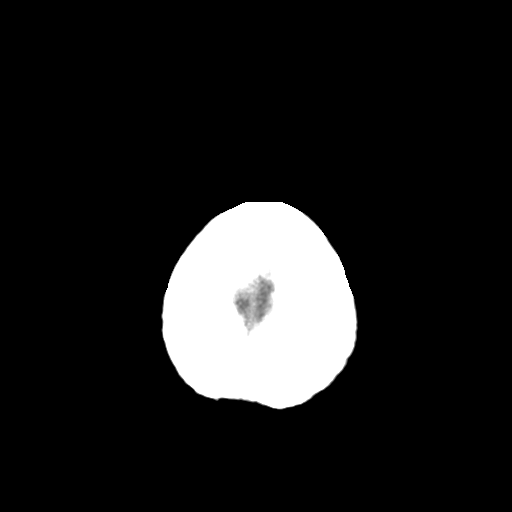

[Series 4: coronal soft tissue · coronal · 0.33mm/px · 3 of 63 slices shown]
[im 21/63  brain]
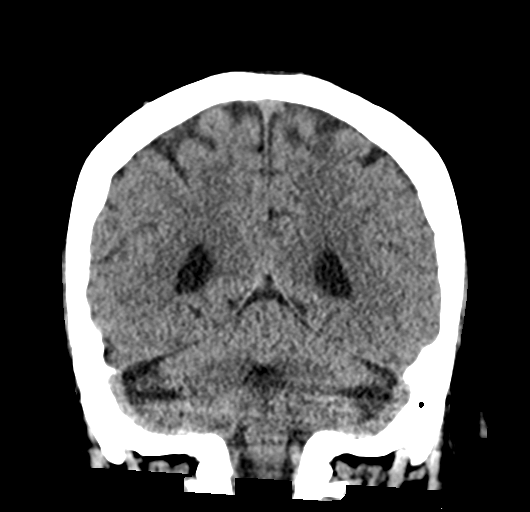
[im 28/63  brain]
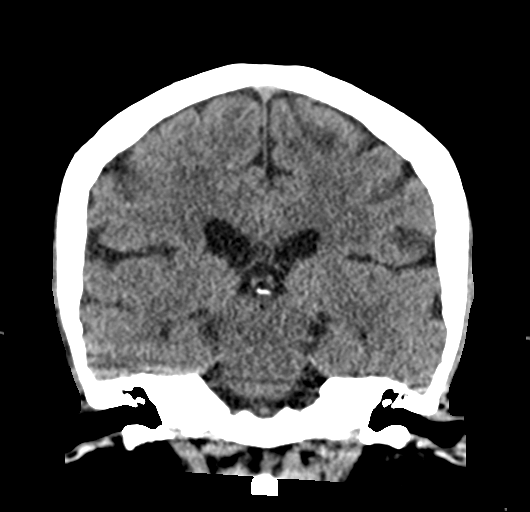
[im 35/63  brain]
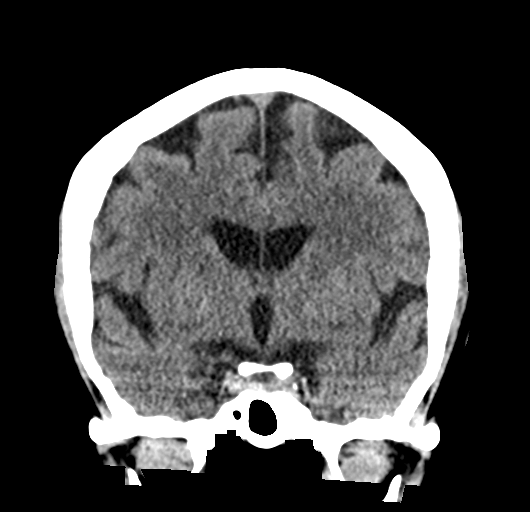

[Series 5: sagittal soft tissue · sagittal · 0.33mm/px · 3 of 59 slices shown]
[im 20/59  brain]
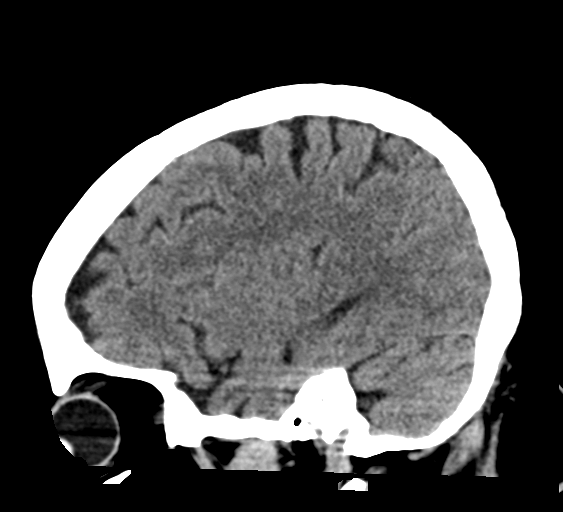
[im 30/59  brain]
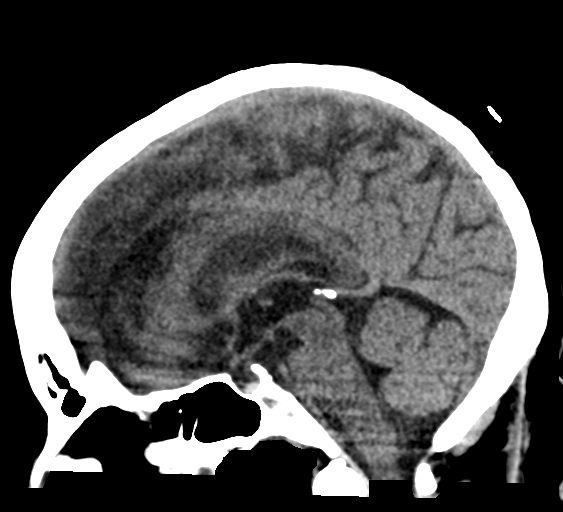
[im 39/59  brain]
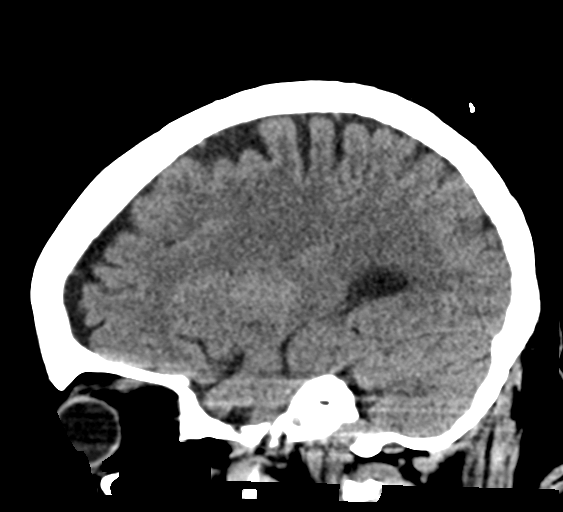

[17 of 47 positions shown; findings below may reference images not displayed]

FINDINGS: Brain: No evidence of acute infarction, hemorrhage, hydrocephalus,
extra-axial collection or mass lesion/mass effect. Mild
periventricular white matter hypodensity.

Vascular: No hyperdense vessel or unexpected calcification.

Skull: Normal. Negative for fracture or focal lesion.

Sinuses/Orbits: No acute finding.

Other: None.
IMPRESSION: No acute intracranial pathology. Mild small-vessel white matter
disease

## 2023-05-08 NOTE — Progress Notes (Signed)
Name: [name]   MRN: [id number]    DOB: [birth year]   Date:05/09/2023       Progress Note  Subjective  Chief Complaint  Pre-Surgery Exam  HPI  Pre op: requested by urologist for surgery 09/16, explained we need to get labs and see her once more prior to filling out forms.   Chronic bronchitis/hypoxemia/OSA  she states positive for OSA however unable to find a mask that fits her face and is only on nocturnal oxygen 2 liters. She also has chronic bronchitis ( second hand smoker) denies daily cough and states no longer having SOB . Unchanged    GERD: seeing Dr. Servando Snare , she takes PPI but must follow up for more refills.    DMII: used to see Dr. Patrecia Pace A1C , we do not have the most recent A1C level. She has been using insulin for years. Glucose has been out of control over the past year, she states it can go up to 400 at least once a week. Since Dr. Patrecia Pace retired she is now waiting to see Endo at [facility] but not sure of the date. She did not bring her medications with her today . She has episodes of  polyphagia, polydipsia or polyuria.  She has obesity , dyslipidemia , CKI and neuropathy. She was reminded to have yearly eye exam.  She is on ACE, statin therapy and gabapentin for neuropathy.    Hypothyroidism: used to be managed by Dr. Patrecia Pace but since he retired she needs to get refills from me.  She denies dysphagia, denies  change in bowel movements . We will check TSH   Schizoaffective disorder/Bipolar : current under the care of [facility]  , she is compliant with medications and visits. She states mood has been stable, denies any hypomania or mania,. Stable, came in today with her husband    CAD/angina : echo normal EF,she lost to follow up with Dr. Mariah Milling. Denies chest pain , she is on medical management and symptoms of angina controlled . She must see Dr. Mariah Milling for refills and we will also ask for pre op for urological procedure - patient last seen in our  office for regular visit one year ago. Non compliance    Vitamin B12 and Vtiamin D deficiency: advised her to resume supplementation.   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 Patrecia Pace but since practice closed she has not been able to get a refill.    Migraine headaches: episodes varies depending on stress, currently having episodes once or twice a week. She states Imitrex has not been working lately, Bernita Raisin was not filled  - she is not sure why - we will try Nurtec for prevention every other day since episodes can last up to 48 hours .  She describes headaches as aching or tightness, has photophobia and phonophobia .  She states occasionally associated  with nausea    Patient Active Problem List   Diagnosis Date Noted   Insulin dependent type 2 diabetes mellitus (HCC) 03/19/2023   Bacteremia due to Escherichia coli 09/13/2022   Encephalopathy acute 09/13/2022   Severe sepsis (HCC) 09/10/2022   Vertigo 02/10/2022   Abnormal urinalysis 02/10/2022   TIA (transient ischemic attack) 10/04/2021   HTN (hypertension) 10/04/2021   Chronic kidney disease, stage 3a (HCC) 10/04/2021   Depression with anxiety 10/04/2021   COPD (chronic obstructive pulmonary disease) (HCC) 10/04/2021   CAD (coronary artery disease) 10/04/2021   Chronic diastolic CHF (congestive heart failure) (HCC) 10/04/2021   Type II diabetes mellitus with renal manifestations (HCC) 06/30/2021   Interstitial cystitis 06/30/2021    Atherosclerosis of aorta (HCC) 03/28/2021   Iron deficiency anemia due to chronic blood loss    Polyp of ascending colon    Vitamin B12 deficiency 08/08/2018   Patient is full code 10/30/2017   Coronary artery disease of native artery of native heart with stable angina pectoris (HCC) 10/09/2017   Plantar fascial fibromatosis 10/09/2017   Tendinitis of wrist 10/09/2017   Schizoaffective disorder, bipolar type (HCC) 09/28/2017   Adenomatous colon polyp 10/15/2015   Bipolar affective disorder (HCC) 10/15/2015   CN (constipation) 10/15/2015   HLD (hyperlipidemia) 10/15/2015   NASH (nonalcoholic steatohepatitis) 10/15/2015   Obesity (BMI 35.0-39.9 without comorbidity) 10/15/2015   Restless leg syndrome 10/15/2015   Low back pain with left-sided sciatica 08/18/2015   Apnea, sleep 05/05/2015   Heart valve disease 05/05/2015   Complicated UTI (urinary tract infection) 03/24/2015   CKD stage 3 due to type 2 diabetes mellitus (HCC) 03/15/2015   Migraine 03/12/2015   Chronic bronchitis (HCC) 01/20/2015   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HCC) 01/20/2015   GERD (gastroesophageal reflux disease) 01/20/2015   Hypothyroid    Vitamin D deficiency    Urinary incontinence    Liver lesion    Hypomagnesemia    Obstructive sleep apnea 10/20/2013   Allergic rhinitis 07/04/2007    Past Surgical History:  Procedure Laterality Date   ABDOMINAL HYSTERECTOMY     Partial   bladder stim  2007   BLADDER SURGERY     x 2. tacking of bladder and the other was the stimulator placement   CHOLECYSTECTOMY     COLONOSCOPY WITH PROPOFOL N/A 03/04/2019   Procedure: COLONOSCOPY WITH PROPOFOL;  Surgeon: Midge Minium, MD;  Location: [facility] ENDOSCOPY;  Service: Endoscopy;  Laterality: N/A;   ESOPHAGOGASTRODUODENOSCOPY (EGD) WITH PROPOFOL N/A 03/04/2019   Procedure: ESOPHAGOGASTRODUODENOSCOPY (EGD) WITH PROPOFOL;  Surgeon: Midge Minium, MD;  Location: [facility] ENDOSCOPY;  Service: Endoscopy;  Laterality: N/A;   INCISION AND  DRAINAGE ABSCESS Right 04/16/2019   Procedure: INCISION AND DRAINAGE ABSCESS;  Surgeon: Sung Amabile, DO;  Location: [facility] ORS;  Service: General;  Laterality: Right;   INTERSTIM-GENERATOR CHANGE N/A 12/31/2017   Procedure: INTERSTIM-GENERATOR CHANGE;  Surgeon: Alfredo Martinez, MD;  Location: [facility] ORS;  Service: Urology;  Laterality: N/A;   LIVER BIOPSY  March 2016   Negative   LUNG BIOPSY  2015   small spot on lung   TUBAL LIGATION     UPPER GI ENDOSCOPY     WRIST SURGERY Right 11/02/2019    Family History  Problem Relation Age of Onset   Diabetes Mother    Heart disease Mother    Hyperlipidemia Mother    Hypertension Mother    Kidney disease Mother    Mental illness Mother    Hypothyroidism Mother    Stroke Mother  Osteoporosis Mother    Glaucoma Mother    Congestive Heart Failure Mother    Hypertension Father    Kidney disease Brother    Intracerebral hemorrhage Brother    Breast cancer Maternal Grandmother 60   Heart disease Maternal Grandfather    Rheum arthritis Maternal Grandfather    Cancer Maternal Grandfather        liver   Kidney disease Maternal Grandfather    Cancer Paternal Grandmother        lung   Asthma Daughter    Cancer Daughter        throat   Bipolar disorder Daughter    Bipolar disorder Daughter    Hypertension Daughter    Restless legs syndrome Daughter     Social History   Tobacco Use   Smoking status: Never   Smokeless tobacco: Never  Substance Use Topics   Alcohol use: No     Current Outpatient Medications:    atorvastatin (LIPITOR) 80 MG tablet, Take 80 mg by mouth at bedtime. , Disp: , Rfl:    busPIRone (BUSPAR) 30 MG tablet, Take 30 mg by mouth 2 (two) times daily., Disp: , Rfl:    cetirizine (ZYRTEC) 10 MG tablet, Take 1 tablet (10 mg total) by mouth daily., Disp: 30 tablet, Rfl: 0   citalopram (CELEXA) 20 MG tablet, Take 20 mg by mouth daily., Disp: , Rfl:    esomeprazole (NEXIUM) 40 MG capsule, Take 1 capsule (40 mg total)  by mouth every morning. MUST SCHEDULE OFFICE VISIT, Disp: 30 capsule, Rfl: 0   ezetimibe (ZETIA) 10 MG tablet, Take 10 mg by mouth at bedtime., Disp: , Rfl:    fenofibrate micronized (LOFIBRA) 134 MG capsule, Take 134 mg by mouth at bedtime., Disp: , Rfl:    fluticasone (FLONASE) 50 MCG/ACT nasal spray, PLACE 2 SPRAYS INTO BOTH NOSTRILS DAILY, Disp: 16 g, Rfl: 0   gabapentin (NEURONTIN) 400 MG capsule, Take 400 mg by mouth 2 (two) times daily., Disp: , Rfl:    GLYXAMBI 25-5 MG TABS, Take 1 tablet by mouth daily., Disp: , Rfl:    GVOKE HYPOPEN 2-PACK 1 MG/0.2ML SOAJ, Inject 1 mg into the skin as needed (for hypoglycemia episodes (glucose below 60 and symptomatic))., Disp: 0.4 mL, Rfl: 1   HUMALOG KWIKPEN 100 UNIT/ML KwikPen, Inject into the skin., Disp: , Rfl:    HUMULIN R U-500 KWIKPEN 500 UNIT/ML kwikpen, Inject 70 Units into the skin 3 (three) times daily with meals., Disp: , Rfl:    hydrOXYzine (ATARAX) 50 MG tablet, Take 50 mg by mouth 3 (three) times daily., Disp: , Rfl:    Insulin Pen Needle (PEN NEEDLES) 31G X 8 MM MISC, , Disp: , Rfl:    lamoTRIgine (LAMICTAL) 200 MG tablet, Take 200 mg by mouth 2 (two) times daily. , Disp: , Rfl:    linaclotide (LINZESS) 145 MCG CAPS capsule, TAKE 1 CAPSULE BY MOUTH DAILY BEFORE BREAKFAST (Patient taking differently: Take 145 mcg by mouth daily before breakfast.), Disp: 30 capsule, Rfl: 3   meclizine (ANTIVERT) 50 MG tablet, Take 1 tablet (50 mg total) by mouth 3 (three) times daily as needed., Disp: 30 tablet, Rfl: 2   metoprolol tartrate (LOPRESSOR) 25 MG tablet, Take 1 tablet (25 mg total) by mouth 2 (two) times daily. NEED APPOINTMENT, Disp: 60 tablet, Rfl: 0   nitroGLYCERIN (NITROSTAT) 0.4 MG SL tablet, Place 1 tablet (0.4 mg total) under the tongue every 5 (five) minutes as needed for chest pain., Disp: 25  tablet, Rfl: 0   ondansetron (ZOFRAN-ODT) 4 MG disintegrating tablet, Take 1 tablet (4 mg total) by mouth every 8 (eight) hours as needed., Disp: 20  tablet, Rfl: 0   OneTouch Delica Lancets 30G MISC, by Does not apply route., Disp: , Rfl:    QUEtiapine (SEROQUEL) 300 MG tablet, Take 300 mg by mouth at bedtime. Along with 200mg  dose for a total of 500mg , Disp: , Rfl:    SUMAtriptan (IMITREX) 100 MG tablet, TAKE 1 TABLET AT ONSET OF MIGRAINE. MAY REPEAT IN 2 HOURS IF HEADACHE PERSISTS OR RECURS., Disp: 10 tablet, Rfl: 0   SYNTHROID 100 MCG tablet, Take 100 mcg by mouth daily., Disp: , Rfl:    TRULICITY 1.5 MG/0.5ML SOPN, Inject 1.5 mg into the skin once a week. (Wednesdays), Disp: , Rfl:    Vibegron (GEMTESA) 75 MG TABS, Take 1 tablet (75 mg total) by mouth daily., Disp: 30 tablet, Rfl: 0  Allergies  Allergen Reactions   Mirtazapine Anaphylaxis    REACTION: closes throat   Morphine And Codeine     anaphylaxis   Sulfamethoxazole-Trimethoprim Rash    REACTION: Whelps all over   Amoxicillin Er Nausea And Vomiting    Pt doesn't remember problem with amoxicillin -  SE Has patient had a PCN reaction causing immediate rash, facial/tongue/throat swelling, SOB or lightheadedness with hypotension: No Has patient had a PCN reaction causing severe rash involving mucus membranes or skin necrosis: No Has patient had a PCN reaction that required hospitalization: No Has patient had a PCN reaction occurring within the last 10 years: no If all of the above answers are "NO", then may proceed with Cephalosporin use.   Codeine Itching    REACTION: itch, rash   Hydrocodone-Acetaminophen Rash    REACTION: rash   Keflex [Cephalexin] Rash    Pt does not remember having a reaction or rash, no airway involvement   Prasterone Nausea And Vomiting    I personally reviewed active problem list, medication list, allergies, family history, social history, health maintenance with the patient/caregiver today.   ROS  Constitutional: Negative for fever, positive for  weight change - almost 10 lbs in the last year .  Respiratory: Negative for cough and shortness of  breath.   Cardiovascular: Negative for chest pain or palpitations.  Gastrointestinal: Negative for abdominal pain, no bowel changes.  Musculoskeletal: Negative for gait problem or joint swelling.  Skin: positive for rash on right ear lobe  Neurological: Negative for dizziness or headache.  No other specific complaints in a complete review of systems (except as listed in HPI above).   Objective  Vitals:   05/09/23 1058  BP: 118/70  Pulse: 82  Resp: 16  Temp: 98 F (36.7 C)  TempSrc: Oral  SpO2: 95%  Weight: 183 lb 6.4 oz (83.2 kg)  Height: 4\' 11"  (1.499 m)    Body mass index is 37.04 kg/m.  Physical Exam  Constitutional: Patient appears well-developed and well-nourished. Obese  No distress.  HEENT: head atraumatic, normocephalic, pupils equal and reactive to light, ears rash/eczematous like on right ear lobe , neck supple, throat within normal limits Cardiovascular: Normal rate, regular rhythm and normal heart sounds.  No murmur heard. No BLE edema. Pulmonary/Chest: Effort normal and breath sounds normal. No respiratory distress. Abdominal: Soft.  There is no tenderness. Psychiatric: Patient has a normal mood and affect. behavior is normal. Judgment and thought content normal.    Diabetic Foot Exam: Diabetic Foot Exam - Simple   Simple Foot  Form Visual Inspection No deformities, no ulcerations, no other skin breakdown bilaterally: Yes Sensation Testing Intact to touch and monofilament testing bilaterally: Yes Pulse Check Posterior Tibialis and Dorsalis pulse intact bilaterally: Yes Comments      PHQ2/9:    03/19/2023    8:39 AM 11/17/2022    9:52 AM 11/02/2022   11:37 AM 10/03/2022    1:16 PM 09/27/2022   10:25 AM  Depression screen PHQ 2/9  Decreased Interest 2 0 0 0 0  Down, Depressed, Hopeless 1 0 0 0 0  PHQ - 2 Score 3 0 0 0 0  Altered sleeping 0   0 0  Tired, decreased energy 0   0 0  Change in appetite 0   0 0  Feeling bad or failure about yourself  0    0 0  Trouble concentrating 1   0 0  Moving slowly or fidgety/restless 0   0 0  Suicidal thoughts 0   0 0  PHQ-9 Score 4   0 0  Difficult doing work/chores Somewhat difficult        phq 9 is positive   Fall Risk:    05/09/2023   11:00 AM 03/19/2023    8:39 AM 11/17/2022    9:51 AM 11/02/2022   11:37 AM 10/03/2022    1:16 PM  Fall Risk   Falls in the past year? 1 1 1 1  0  Number falls in past yr: 1 1 1 1  0  Comment   10    Injury with Fall? 0 0 1 0 0  Risk for fall due to : History of fall(s) Impaired balance/gait History of fall(s);Impaired balance/gait;Impaired mobility History of fall(s);Impaired balance/gait;Impaired mobility Impaired balance/gait  Follow up Falls prevention discussed;Education provided;Falls evaluation completed Falls prevention discussed;Education provided;Falls evaluation completed   Falls prevention discussed      Functional Status Survey: Is the patient deaf or have difficulty hearing?: No Does the patient have difficulty seeing, even when wearing glasses/contacts?: Yes Does the patient have difficulty concentrating, remembering, or making decisions?: Yes Does the patient have difficulty walking or climbing stairs?: Yes Does the patient have difficulty dressing or bathing?: No Does the patient have difficulty doing errands alone such as visiting a doctor's office or shopping?: Yes    Assessment & Plan  1. Insulin dependent type 2 diabetes mellitus (HCC)  - Ambulatory referral to Podiatry - Hemoglobin A1C - Urine Microalbumin w/creat. ratio  2. Schizoaffective disorder, bipolar type (HCC)  Keep follow up with psychiatrist   3. Simple chronic bronchitis (HCC)  Stable   4. Atherosclerosis of aorta (HCC)  - atorvastatin (LIPITOR) 80 MG tablet; Take 1 tablet (80 mg total) by mouth at bedtime.  Dispense: 90 tablet; Refill: 1 - ezetimibe (ZETIA) 10 MG tablet; Take 1 tablet (10 mg total) by mouth at bedtime.  Dispense: 90 tablet; Refill: 1 -  fenofibrate micronized (LOFIBRA) 134 MG capsule; Take 1 capsule (134 mg total) by mouth at bedtime.  Dispense: 90 capsule; Refill: 1 - Lipid panel  5. Coronary artery disease of native artery of native heart with stable angina pectoris [facility])  Must see cardiologist, lost to follow up and will need pre op   6. Chronic kidney disease, stage 3a (HCC)  - COMPLETE METABOLIC PANEL WITH GFR - CBC with Differential/Platelet  7. Adult hypothyroidism  - TSH  8. TIA (transient ischemic attack)  Out of statin therapy   9. Migraine without aura and without status migrainosus, not intractable  -  Rimegepant Sulfate (NURTEC) 75 MG TBDP; Take 1 tablet (75 mg total) by mouth every other day.  Dispense: 30 tablet; Refill: 2  10. Perennial allergic rhinitis  - fluticasone (FLONASE) 50 MCG/ACT nasal spray; Place 2 sprays into both nostrils daily.  Dispense: 48 g; Refill: 0  11. Rash  - Ambulatory referral to Dermatology

## 2023-05-09 ENCOUNTER — Encounter: Payer: Self-pay | Admitting: Cardiovascular Disease

## 2023-05-09 ENCOUNTER — Encounter: Payer: Self-pay | Admitting: Family Medicine

## 2023-05-09 ENCOUNTER — Ambulatory Visit (INDEPENDENT_AMBULATORY_CARE_PROVIDER_SITE_OTHER): Payer: Medicare Other | Admitting: Family Medicine

## 2023-05-09 VITALS — BP 118/70 | HR 82 | Temp 98.0°F | Resp 16 | Ht 59.0 in | Wt 183.4 lb

## 2023-05-09 DIAGNOSIS — E039 Hypothyroidism, unspecified: Secondary | ICD-10-CM | POA: Diagnosis not present

## 2023-05-09 DIAGNOSIS — N1831 Chronic kidney disease, stage 3a: Secondary | ICD-10-CM | POA: Diagnosis not present

## 2023-05-09 DIAGNOSIS — G43009 Migraine without aura, not intractable, without status migrainosus: Secondary | ICD-10-CM

## 2023-05-09 DIAGNOSIS — J3089 Other allergic rhinitis: Secondary | ICD-10-CM

## 2023-05-09 DIAGNOSIS — I25118 Atherosclerotic heart disease of native coronary artery with other forms of angina pectoris: Secondary | ICD-10-CM

## 2023-05-09 DIAGNOSIS — Z794 Long term (current) use of insulin: Secondary | ICD-10-CM | POA: Diagnosis not present

## 2023-05-09 DIAGNOSIS — G459 Transient cerebral ischemic attack, unspecified: Secondary | ICD-10-CM

## 2023-05-09 DIAGNOSIS — J410 Simple chronic bronchitis: Secondary | ICD-10-CM | POA: Diagnosis not present

## 2023-05-09 DIAGNOSIS — I700 Atherosclerosis of aorta: Secondary | ICD-10-CM

## 2023-05-09 DIAGNOSIS — R21 Rash and other nonspecific skin eruption: Secondary | ICD-10-CM

## 2023-05-09 DIAGNOSIS — E119 Type 2 diabetes mellitus without complications: Secondary | ICD-10-CM | POA: Diagnosis not present

## 2023-05-09 DIAGNOSIS — F250 Schizoaffective disorder, bipolar type: Secondary | ICD-10-CM | POA: Diagnosis not present

## 2023-05-09 MED ORDER — FLUTICASONE PROPIONATE 50 MCG/ACT NA SUSP: 2.0000 | Freq: Every day | NASAL | 0 refills | Status: DC

## 2023-05-09 MED ORDER — NURTEC 75 MG PO TBDP
1.0000 | ORAL_TABLET | ORAL | 2 refills | Status: DC
Start: 2023-05-09 — End: 2023-07-03

## 2023-05-09 MED ORDER — EZETIMIBE 10 MG PO TABS
10.0000 mg | ORAL_TABLET | Freq: Every day | ORAL | 1 refills | Status: DC
Start: 2023-05-09 — End: 2023-09-22

## 2023-05-09 MED ORDER — FENOFIBRATE MICRONIZED 134 MG PO CAPS
134.0000 mg | ORAL_CAPSULE | Freq: Every day | ORAL | 1 refills | Status: DC
Start: 2023-05-09 — End: 2023-09-22

## 2023-05-09 MED ORDER — ATORVASTATIN CALCIUM 80 MG PO TABS
80.0000 mg | ORAL_TABLET | Freq: Every day | ORAL | 1 refills | Status: DC
Start: 2023-05-09 — End: 2024-02-07

## 2023-05-09 NOTE — Telephone Encounter (Signed)
Error

## 2023-05-10 LAB — MICROALBUMIN / CREATININE URINE RATIO
Creatinine, Urine: 40 mg/dL (ref 20–275)
Microalb Creat Ratio: 38 mg/g{creat} — ABNORMAL HIGH (ref <30)
Microalb, Ur: 1.5 mg/dL

## 2023-05-10 LAB — LIPID PANEL
Cholesterol: 88 mg/dL (ref <200)
HDL: 28 mg/dL — ABNORMAL LOW (ref >=50)
LDL Cholesterol (Calc): 31 mg/dL
Non-HDL Cholesterol (Calc): 60 mg/dL (ref <130)
Total CHOL/HDL Ratio: 3.1 (calc) (ref <5.0)
Triglycerides: 233 mg/dL — ABNORMAL HIGH (ref <150)

## 2023-05-10 LAB — CBC WITH DIFFERENTIAL/PLATELET
Absolute Monocytes: 622 {cells}/uL (ref 200–950)
Basophils Absolute: 37 {cells}/uL (ref 0–200)
Basophils Relative: 0.5 %
Eosinophils Absolute: 133 {cells}/uL (ref 15–500)
Eosinophils Relative: 1.8 %
HCT: 38.3 % (ref 35.0–45.0)
Hemoglobin: 12.4 g/dL (ref 11.7–15.5)
Lymphs Abs: 2198 {cells}/uL (ref 850–3900)
MCH: 29.2 pg (ref 27.0–33.0)
MCHC: 32.4 g/dL (ref 32.0–36.0)
MCV: 90.3 fL (ref 80.0–100.0)
MPV: 10.2 fL (ref 7.5–12.5)
Monocytes Relative: 8.4 %
Neutro Abs: 4410 {cells}/uL (ref 1500–7800)
Neutrophils Relative %: 59.6 %
Platelets: 233 10*3/uL (ref 140–400)
RBC: 4.24 10*6/uL (ref 3.80–5.10)
RDW: 12.4 % (ref 11.0–15.0)
Total Lymphocyte: 29.7 %
WBC: 7.4 10*3/uL (ref 3.8–10.8)

## 2023-05-10 LAB — COMPLETE METABOLIC PANEL WITH GFR
AG Ratio: 2.1 (calc) (ref 1.0–2.5)
ALT: 28 U/L (ref 6–29)
AST: 30 U/L (ref 10–35)
Albumin: 3.8 g/dL (ref 3.6–5.1)
Alkaline phosphatase (APISO): 56 U/L (ref 37–153)
BUN/Creatinine Ratio: 25 (calc) — ABNORMAL HIGH (ref 6–22)
BUN: 33 mg/dL — ABNORMAL HIGH (ref 7–25)
CO2: 25 mmol/L (ref 20–32)
Calcium: 9.9 mg/dL (ref 8.6–10.4)
Chloride: 101 mmol/L (ref 98–110)
Creat: 1.33 mg/dL — ABNORMAL HIGH (ref 0.50–1.03)
Globulin: 1.8 g/dL — ABNORMAL LOW (ref 1.9–3.7)
Glucose, Bld: 187 mg/dL — ABNORMAL HIGH (ref 65–99)
Potassium: 4.3 mmol/L (ref 3.5–5.3)
Sodium: 140 mmol/L (ref 135–146)
Total Bilirubin: 0.4 mg/dL (ref 0.2–1.2)
Total Protein: 5.6 g/dL — ABNORMAL LOW (ref 6.1–8.1)
eGFR: 46 mL/min/{1.73_m2} — ABNORMAL LOW (ref >=60)

## 2023-05-10 LAB — HEMOGLOBIN A1C
Hgb A1c MFr Bld: 10.4 %{Hb} — ABNORMAL HIGH (ref <5.7)
Mean Plasma Glucose: 252 mg/dL
eAG (mmol/L): 13.9 mmol/L

## 2023-05-10 LAB — TSH: TSH: 0.33 m[IU]/L — ABNORMAL LOW (ref 0.40–4.50)

## 2023-05-10 NOTE — Progress Notes (Signed)
Cardiology Office Note:  .   Date:  05/11/2023  ID:  [name], DOB [birth year], MRN [id number] PCP: Stacey Cory, MD  [facility] Providers Cardiologist:  Julien Nordmann, MD    History of Present Illness: .   [name] is a 57 y.o. female with a past medical history of uncontrolled type 2 diabetes, hypothyroidism, NASH, restless leg syndrome, chronic back pain, sinusitis, depression, history of COVID-19 infection obstructive sleep apnea, chronic HFpEF, essential hypertension, TIA, chronic chest pain, schizo affective disorder, who is here today for follow-up.  CT of the chest completed in 2018 was unable to exclude mild coronary artery calcification with mid LAD otherwise no aortic atherosclerosis.  Echocardiogram revealed EF 55 to 60%, no RWMA.  In 04/2020 she tested positive for COVID-19 infection and had to be admitted to the hospital for 4 days was unable to be discharged home.  Repeat echocardiogram done in 06/2020 revealed EF 55 to 60%, no RWMA, moderate asymmetric left ventricular hypertrophy of the septal segment questionable, and indeterminate diastolic parameters.  Carotid duplex showed no significant disease.  10/2020 Lexiscan MPI was ordered by primary cardiologist ruled a low risk scan.  CT attenuation correction images showed mild aortic atherosclerosis with moderate proximal to mid LAD disease.  She was last seen in clinic 01/2021 with complaints of irregular heartbeat, chest pain, and peripheral edema.  Metoprolol was increased to 25 mg twice daily.  She was to return in 3 months but unfortunately was lost to follow-up.  Throughout 2023 she had multiple emergency department visits and hospitalizations for TIA symptoms, aphasia, weakness, falls, emesis.  Thus far for 2024 she has had 11 visits to the emergency department for various symptoms of weakness, nausea, fall, dysuria, and flank pain.  Her last visit to the [facility] emergency department was on 04/17/2023 status post  mechanical fall.  Stated history of vertigo and she took her meclizine that morning but had not helped.  She was also complaining of right hip pain.  Vital signs were stable.  Imaging was unrevealing.  She was treated with meclizine and oxycodone/acetaminophen 5/325 and was discharged home.  She returns to clinic today accompanied by her husband. She denies any chest pain, shortness of breath, palpitations, or peripheral edema. She continues to suffer from extreme vertigo. She has had several falls at home with a few visits to the emergency department. Blood pressure is slightly elevated this morning as she has been out of her metoprolol for approximately 2 weeks. She also need clearance for upcoming surgery.   ROS: 10 point review of systems has been reviewed and considered negative with exception what is been listed in the HPI  Studies Reviewed: Marland Kitchen   EKG Interpretation Date/Time:  Friday May 11 2023 08:00:04 EDT Ventricular Rate:  82 PR Interval:  174 QRS Duration:  96 QT Interval:  398 QTC Calculation: 464 R Axis:   -30  Text Interpretation: Normal sinus rhythm Left axis deviation When compared with ECG of 11-May-2023 07:58, Confirmed by Charlsie Quest ([id number]) on 05/11/2023 9:30:22 AM  MPI 10/2020 Pharmacological myocardial perfusion imaging study with no significant  ischemia Normal wall motion, EF estimated at 65% No EKG changes concerning for ischemia at peak stress or in recovery. CT attenuation correction images with mild aortic atherosclerosis, moderate proximal to mid LAD disease. Low risk scan   Echo 12/01/20  1. Left ventricular ejection fraction, by estimation, is 60 to 65%. The  left ventricle has normal function. The left ventricle has  no regional  wall motion abnormalities. There is mild left ventricular hypertrophy.  Left ventricular diastolic parameters  were normal. The global longitudinal strain is normal.   2. Right ventricular systolic function is normal. The  right ventricular  size is normal. Tricuspid regurgitation signal is inadequate for assessing  PA pressure.   3. The mitral valve is normal in structure. No evidence of mitral valve  regurgitation. No evidence of mitral stenosis.   4. The aortic valve is normal in structure. Aortic valve regurgitation is  not visualized. No aortic stenosis is present.    Carotid study 07/12/20 Summary:  Right Carotid: There is no evidence of stenosis in the right ICA.  Left Carotid: There is no evidence of stenosis in the left ICA.  Vertebrals:  Bilateral vertebral arteries demonstrate antegrade flow.  Subclavians: Normal flow hemodynamics were seen in bilateral subclavian               arteries.     Echo 06/08/20  1. Left ventricular ejection fraction, by estimation, is 55 to 60%. The  left ventricle has normal function. The left ventricle has no regional  wall motion abnormalities. There is moderate asymmetric left ventricular  hypertrophy of the septal segment.  Left ventricular diastolic parameters are indeterminate.   2. Right ventricular systolic function is low normal. The right  ventricular size is normal. There is normal pulmonary artery systolic  pressure.   3. The mitral valve is normal in structure. Trivial mitral valve  regurgitation.   4. The aortic valve was not well visualized. Aortic valve regurgitation  is not visualized. No aortic stenosis is present.   5. The inferior vena cava is normal in size with greater than 50%  respiratory variability, suggesting right atrial pressure of 3 mmHg.    Echo 12/04/19  1. Left ventricular ejection fraction, by estimation, is 55 to 60%. The  left ventricle has normal function. The left ventricle has no regional  wall motion abnormalities. Left ventricular diastolic parameters were  normal.   2. Right ventricular systolic function is normal. The right ventricular  size is normal. There is normal pulmonary artery systolic pressure.   3. The  mitral valve is normal in structure. No evidence of mitral valve  regurgitation. No evidence of mitral stenosis.   4. The aortic valve is normal in structure. Aortic valve regurgitation is  not visualized. No aortic stenosis is present.   5. The inferior vena cava is normal in size with greater than 50%  respiratory variability, suggesting right atrial pressure of 3 mmHg.    CTA 04/28/2020 IMPRESSION: 1. No pulmonary embolism.  Study is mildly limited by bolus timing. 2. Bilateral ground-glass opacities throughout the chest with peripheral predominance. Findings are consistent with the provided history of COVID-19 infection. 3. Mildly enlarged mediastinal and hilar lymph nodes, likely reactive. 4. Hepatic steatosis. 5. Aortic atherosclerosis.   Aortic Atherosclerosis (ICD10-I70.0). Risk Assessment/Calculations:             Physical Exam:   VS:  BP 138/84 (BP Location: Left Arm, Patient Position: Sitting, Cuff Size: Large)   Pulse 82   Ht 4\' 11"  (1.499 m)   Wt 185 lb 9.6 oz (84.2 kg)   SpO2 93%   BMI 37.49 kg/m    Wt Readings from Last 3 Encounters:  05/11/23 185 lb 9.6 oz (84.2 kg)  05/09/23 183 lb 6.4 oz (83.2 kg)  04/17/23 187 lb 6.3 oz (85 kg)    GEN: Well nourished, well developed  in no acute distress NECK: No JVD; No carotid bruits CARDIAC: RRR, no murmurs, rubs, gallops RESPIRATORY:  Clear to auscultation without rales, wheezing or rhonchi  ABDOMEN: Soft, non-tender, non-distended EXTREMITIES:  No edema; No deformity   ASSESSMENT AND PLAN: .   Chronic HFpEF with a last LVEF 60 to 65% in 11/2020.  There was no RWMA.  Patient states that she has been doing well denies any shortness of breath, peripheral edema, or weight gain.  She appears to be euvolemic on exam.  Does not quire any diuretic therapy.  Is continued on metoprolol tartrate 25 mg twice daily.    Chronic chest pain which she states that she feels well today.  Coronary artery calcium noted on CT scan in  2018.  Lexiscan Myoview showed no significant ischemia was considered low risk scan in 10/2020.  EKG today reveals a sinus rhythm with a left axis deviation with no ischemic changes noted.  She is continued on statin therapy.  No further ischemic testing needed today.  Primary hypertension blood pressure 132/99 and recheck of 138/84.  Unfortunately patient states that she has been out of her metoprolol tartrate for approximately 2 weeks.  Medication was refilled and sent to the pharmacy of choice.  Mixed Hyperlipidemia with hypertriglyceridemia continued on Lipitor Zetia and fenofibrate.  LDL 31 05/2023 with triglycerides of 233, HDL 28.  To be managed by her PCP.  Palpitations that she has rarely had complaints of.  Unfortunately she has run out of her metoprolol for the past 2 weeks so that is being restarted today. Obstructive sleep apnea continue with home O2/BiPAP/CPAP as recommended by pulmonary.  Chronic vertigo where she has had several emergency department visits.  She is continued on meclizine.  Carotid duplex previously completed without significant disease.  Preoperative cardiovascular examination.  Patient denies shortness of breath, changes in chronic chest discomfort and she states that her chest discomfort has been decreased with a decrease in her palpitations.  She also thinks that her nerve stimulator has an impact on her discomfort as well.  She is scheduled to undergo upcoming procedure movable of InterStim implant, stage I and II InterStim placement with impedance check by Dr. Sherron Monday.  Patient was also advised to stop taking clopidogrel 5 days prior to surgery.  On arrival today clopidogrel is no longer showing on patient's medication list.  Unsure of when patient stopped clopidogrel as her surgery is not until 05/21/2023.  According to ACC/AHA guidelines no further cardiovascular testing stating the patient may proceed to surgery at acceptable risk.        Ms. Pitcher's  perioperative risk of a major cardiac event is 6.6% according to the Revised Cardiac Risk Index (RCRI).  Therefore, she is at high risk for perioperative complications.   Her functional capacity is fair at 4.64 METs according to the Duke Activity Status Index (DASI). Recommendations: According to ACC/AHA guidelines, no further cardiovascular testing needed.  The patient may proceed to surgery at acceptable risk.     Dispo: Patient return to clinic to see MD/APP in 1 year or sooner if needed  Signed, Violette Morneault, NP

## 2023-05-10 NOTE — Progress Notes (Deleted)
Cardiology Office Note    Date:  05/10/2023   ID:  Deania, Rockwood 06/15/1966, MRN [id number]  PCP:  Alba Cory, MD  Cardiologist:  Julien Nordmann, MD  Electrophysiologist:  None   Chief Complaint: ***  History of Present Illness:   MERREDITH STANO is a 57 y.o. female with history of ***  ***   Labs independently reviewed: 05/2023 - Hgb 12.4, PLT 233, TSH 0.33, BUN 33, serum creatinine 1.33, albumin 3.8, AST/ALT normal, TC 88, TG 233, HDL 31, A1c 10.4  Past Medical History:  Diagnosis Date   Anxiety    Arthritis    Asthma    Bipolar depression (HCC)    CHF (congestive heart failure) (HCC)    Chronic headaches    migraines have lessened   Chronic kidney disease    ddstage 111   COPD (chronic obstructive pulmonary disease) (HCC)    Coronary artery disease    Depression    Diabetic neuropathy (HCC)    Dyspnea    uses inhalers for this   Dysrhythmia    rapid   Fatty liver    GERD (gastroesophageal reflux disease)    Headache    Heart attack (HCC) 2009   Hyperlipidemia    Hypertension    Hypomagnesemia    Hypothyroid    Irregular heart rhythm    Liver lesion    Favored to be benign on CT; MRI of abd w/contract in Aug 2016. Liver Biopsy-Negative, March 2016   Morbid obesity (HCC)    OSA (obstructive sleep apnea)    Peripheral vascular disease (HCC)    Pinched nerve    Back   Pneumonia    RLS (restless legs syndrome)    Seasonal allergies    Sleep apnea    uses oxygen at night 2L d/t cpap did not fit   Type 2 diabetes, uncontrolled, with neuropathy 10/15/2015   Overview:  Microalbumin 7.7 12/2010: Hgb A1c 10.2% 12/2010 and 9.7% 03/2011, eye exam 10/2009 [facility] clinic    Urinary incontinence    Vitamin D deficiency     Past Surgical History:  Procedure Laterality Date   ABDOMINAL HYSTERECTOMY     Partial   bladder stim  2007   BLADDER SURGERY     x 2. tacking of bladder and the other was the stimulator placement   CHOLECYSTECTOMY     COLONOSCOPY  WITH PROPOFOL N/A 03/04/2019   Procedure: COLONOSCOPY WITH PROPOFOL;  Surgeon: Midge Minium, MD;  Location: [facility] ENDOSCOPY;  Service: Endoscopy;  Laterality: N/A;   ESOPHAGOGASTRODUODENOSCOPY (EGD) WITH PROPOFOL N/A 03/04/2019   Procedure: ESOPHAGOGASTRODUODENOSCOPY (EGD) WITH PROPOFOL;  Surgeon: Midge Minium, MD;  Location: [facility] ENDOSCOPY;  Service: Endoscopy;  Laterality: N/A;   INCISION AND DRAINAGE ABSCESS Right 04/16/2019   Procedure: INCISION AND DRAINAGE ABSCESS;  Surgeon: Sung Amabile, DO;  Location: [facility] ORS;  Service: General;  Laterality: Right;   INTERSTIM-GENERATOR CHANGE N/A 12/31/2017   Procedure: INTERSTIM-GENERATOR CHANGE;  Surgeon: Alfredo Martinez, MD;  Location: [facility] ORS;  Service: Urology;  Laterality: N/A;   LIVER BIOPSY  March 2016   Negative   LUNG BIOPSY  2015   small spot on lung   TUBAL LIGATION     UPPER GI ENDOSCOPY     WRIST SURGERY Right 11/02/2019    Current Medications: No outpatient medications have been marked as taking for the 05/11/23 encounter (Appointment) with Sondra Barges, PA-C.    Allergies:   Mirtazapine, Morphine and codeine, Sulfamethoxazole-trimethoprim, Amoxicillin er,  Codeine, Hydrocodone-acetaminophen, Keflex [cephalexin], and Prasterone   Social History   Socioeconomic History   Marital status: Married    Spouse name: Not on file   Number of children: 2   Years of education: 12   Highest education level: Not on file  Occupational History   Occupation: disabled  Tobacco Use   Smoking status: Never   Smokeless tobacco: Never  Vaping Use   Vaping status: Never Used  Substance and Sexual Activity   Alcohol use: No   Drug use: No   Sexual activity: Yes    Partners: Male    Birth control/protection: Surgical  Other Topics Concern   Not on file  Social History Narrative   Pt lives with husband and grandchildren   Social Determinants of Health   Financial Resource Strain: Low Risk  (02/02/2022)   Overall Financial Resource Strain  (CARDIA)    Difficulty of Paying Living Expenses: Not very hard  Food Insecurity: Food Insecurity Present (02/15/2022)   Hunger Vital Sign    Worried About Running Out of Food in the Last Year: Often true    Ran Out of Food in the Last Year: Often true  Transportation Needs: No Transportation Needs (02/02/2022)   PRAPARE - Administrator, Civil Service (Medical): No    Lack of Transportation (Non-Medical): No  Physical Activity: Inactive (02/02/2022)   Exercise Vital Sign    Days of Exercise per Week: 0 days    Minutes of Exercise per Session: 0 min  Stress: No Stress Concern Present (02/02/2022)   [employer] of Occupational Health - Occupational Stress Questionnaire    Feeling of Stress : Only a little  Social Connections: Moderately Isolated (02/02/2022)   Social Connection and Isolation Panel [NHANES]    Frequency of Communication with Friends and Family: More than three times a week    Frequency of Social Gatherings with Friends and Family: More than three times a week    Attends Religious Services: Never    Database administrator or Organizations: No    Attends Engineer, structural: Never    Marital Status: Married     Family History:  [name] family history includes Asthma in her daughter; Bipolar disorder in her daughter and daughter; Breast cancer (age of onset: 62) in her maternal grandmother; Cancer in her daughter, maternal grandfather, and paternal grandmother; Congestive Heart Failure in her mother; Diabetes in her mother; Glaucoma in her mother; Heart disease in her maternal grandfather and mother; Hyperlipidemia in her mother; Hypertension in her daughter, father, and mother; Hypothyroidism in her mother; Intracerebral hemorrhage in her brother; Kidney disease in her brother, maternal grandfather, and mother; Mental illness in her mother; Osteoporosis in her mother; Restless legs syndrome in her daughter; Rheum arthritis in her maternal grandfather;  Stroke in her mother.  ROS:   12-point review of systems is negative unless otherwise noted in the HPI.   EKGs/Labs/Other Studies Reviewed:    Studies reviewed were summarized above. The additional studies were reviewed today:  2D echo 02/12/2022:  1. Left ventricular ejection fraction, by estimation, is 55 to 60%. The  left ventricle has normal function. The left ventricle has no regional  wall motion abnormalities. There is mild left ventricular hypertrophy.  Left ventricular diastolic parameters  are consistent with Grade I diastolic dysfunction (impaired relaxation).   2. Right ventricular systolic function is normal. The right ventricular  size is normal. Tricuspid regurgitation signal is inadequate for assessing  PA  pressure.   3. The mitral valve is normal in structure. No evidence of mitral valve  regurgitation. No evidence of mitral stenosis.   4. The aortic valve is normal in structure. Aortic valve regurgitation is  not visualized. No aortic stenosis is present.   5. The inferior vena cava is normal in size with greater than 50%  respiratory variability, suggesting right atrial pressure of 3 mmHg.   6. Agitated saline contrast bubble study was negative, with no evidence  of any interatrial shunt.  __________  Luci Bank patch 10/2021: Patch Wear Time:  16 days and 23 hours (2023-02-21T12:06:28-0500 to 2023-03-15T06:10:19-0400)   Monitor 1 Normal sinus rhythm Patient had a min HR of 56 bpm, max HR of 127 bpm, and avg HR of 85 bpm.  Isolated SVEs were rare (<1.0%), and no SVE Couplets or SVE Triplets were present.  No Isolated VEs, VE Couplets, or VE Triplets were present.  No patient triggered events   Monitor 2 Normal sinus rhythm Patient had a min HR of 55 bpm, max HR of 124 bpm, and avg HR of 86 bpm.  Isolated SVEs were rare (<1.0%), and no SVE Couplets or SVE Triplets were present. Isolated VEs were rare (<1.0%), and no VE Couplets  or VE Triplets were present.  No  patient triggered events __________  2D echo 10/05/2021: 1. Left ventricular ejection fraction, by estimation, is 60 to 65%. The  left ventricle has normal function. The left ventricle has no regional  wall motion abnormalities. Left ventricular diastolic parameters are  consistent with Grade I diastolic  dysfunction (impaired relaxation).   2. Right ventricular systolic function is normal. The right ventricular  size is normal.   3. The mitral valve is normal in structure. No evidence of mitral valve  regurgitation.   4. The aortic valve is tricuspid. Aortic valve regurgitation is not  visualized.   5. The inferior vena cava is normal in size with greater than 50%  respiratory variability, suggesting right atrial pressure of 3 mmHg.  __________  2D echo 12/01/2020: 1. Left ventricular ejection fraction, by estimation, is 60 to 65%. The  left ventricle has normal function. The left ventricle has no regional  wall motion abnormalities. There is mild left ventricular hypertrophy.  Left ventricular diastolic parameters  were normal. The global longitudinal strain is normal.   2. Right ventricular systolic function is normal. The right ventricular  size is normal. Tricuspid regurgitation signal is inadequate for assessing  PA pressure.   3. The mitral valve is normal in structure. No evidence of mitral valve  regurgitation. No evidence of mitral stenosis.   4. The aortic valve is normal in structure. Aortic valve regurgitation is  not visualized. No aortic stenosis is present.  __________  Eugenie Birks MPI 10/29/2020: Pharmacological myocardial perfusion imaging study with no significant  ischemia Normal wall motion, EF estimated at 65% No EKG changes concerning for ischemia at peak stress or in recovery. CT attenuation correction images with mild aortic atherosclerosis, moderate proximal to mid LAD disease. Low risk scan __________  Carotid artery ultrasound 07/12/2020: Summary:   Right Carotid: There is no evidence of stenosis in the right ICA.   Left Carotid: There is no evidence of stenosis in the left ICA.   Vertebrals:  Bilateral vertebral arteries demonstrate antegrade flow.  Subclavians: Normal flow hemodynamics were seen in bilateral subclavian arteries.  __________  2D echo 06/08/2020: 1. Left ventricular ejection fraction, by estimation, is 55 to 60%. The  left ventricle has  normal function. The left ventricle has no regional  wall motion abnormalities. There is moderate asymmetric left ventricular  hypertrophy of the septal segment.  Left ventricular diastolic parameters are indeterminate.   2. Right ventricular systolic function is low normal. The right  ventricular size is normal. There is normal pulmonary artery systolic  pressure.   3. The mitral valve is normal in structure. Trivial mitral valve  regurgitation.   4. The aortic valve was not well visualized. Aortic valve regurgitation  is not visualized. No aortic stenosis is present.   5. The inferior vena cava is normal in size with greater than 50%  respiratory variability, suggesting right atrial pressure of 3 mmHg.  __________  2D echo 12/04/2019: 1. Left ventricular ejection fraction, by estimation, is 55 to 60%. The  left ventricle has normal function. The left ventricle has no regional  wall motion abnormalities. Left ventricular diastolic parameters were  normal.   2. Right ventricular systolic function is normal. The right ventricular  size is normal. There is normal pulmonary artery systolic pressure.   3. The mitral valve is normal in structure. No evidence of mitral valve  regurgitation. No evidence of mitral stenosis.   4. The aortic valve is normal in structure. Aortic valve regurgitation is  not visualized. No aortic stenosis is present.   5. The inferior vena cava is normal in size with greater than 50%  respiratory variability, suggesting right atrial pressure of 3 mmHg.     EKG:  EKG is ordered today.  The EKG ordered today demonstrates ***  Recent Labs: 09/10/2022: B Natriuretic Peptide 46.6 05/09/2023: ALT 28; BUN 33; Creat 1.33; Hemoglobin 12.4; Platelets 233; Potassium 4.3; Sodium 140; TSH 0.33  Recent Lipid Panel    Component Value Date/Time   CHOL 88 05/09/2023 1157   CHOL 196 09/22/2015 1339   CHOL 141 10/25/2013 0314   TRIG 233 (H) 05/09/2023 1157   TRIG 830 (H) 10/25/2013 0314   HDL 28 (L) 05/09/2023 1157   HDL 35 (L) 09/22/2015 1339   HDL 9 (L) 10/25/2013 0314   CHOLHDL 3.1 05/09/2023 1157   VLDL 29 02/11/2022 0642   VLDL SEE COMMENT 10/25/2013 0314   LDLCALC 31 05/09/2023 1157   LDLCALC SEE COMMENT 10/25/2013 0314   LDLDIRECT 65.4 06/08/2008 1456    PHYSICAL EXAM:    VS:  There were no vitals taken for this visit.  BMI: There is no height or weight on file to calculate BMI.  Physical Exam  Wt Readings from Last 3 Encounters:  05/09/23 183 lb 6.4 oz (83.2 kg)  04/17/23 187 lb 6.3 oz (85 kg)  03/27/23 186 lb (84.4 kg)     ASSESSMENT & PLAN:   ***   {Are you ordering a CV Procedure (e.g. stress test, cath, DCCV, TEE, etc)?   Press F2        :[id number]}     Disposition: F/u with Dr. Mariah Milling or an APP in ***.   Medication Adjustments/Labs and Tests Ordered: Current medicines are reviewed at length with the patient today.  Concerns regarding medicines are outlined above. Medication changes, Labs and Tests ordered today are summarized above and listed in the Patient Instructions accessible in Encounters.   Signed, Eula Listen, PA-C 05/10/2023 9:19 AM     [facility] [place] [address] [place], [place] [address] [phone]

## 2023-05-11 ENCOUNTER — Other Ambulatory Visit: Payer: Self-pay | Admitting: Family Medicine

## 2023-05-11 ENCOUNTER — Ambulatory Visit: Payer: Medicare Other | Attending: Physician Assistant | Admitting: Cardiology

## 2023-05-11 ENCOUNTER — Ambulatory Visit: Payer: Medicare Other | Admitting: Physician Assistant

## 2023-05-11 ENCOUNTER — Encounter: Payer: Self-pay | Admitting: Cardiology

## 2023-05-11 VITALS — BP 138/84 | HR 82 | Ht 59.0 in | Wt 185.6 lb

## 2023-05-11 DIAGNOSIS — Z01810 Encounter for preprocedural cardiovascular examination: Secondary | ICD-10-CM

## 2023-05-11 DIAGNOSIS — R079 Chest pain, unspecified: Secondary | ICD-10-CM | POA: Diagnosis not present

## 2023-05-11 DIAGNOSIS — I2510 Atherosclerotic heart disease of native coronary artery without angina pectoris: Secondary | ICD-10-CM | POA: Diagnosis not present

## 2023-05-11 DIAGNOSIS — G8929 Other chronic pain: Secondary | ICD-10-CM

## 2023-05-11 DIAGNOSIS — I10 Essential (primary) hypertension: Secondary | ICD-10-CM | POA: Diagnosis not present

## 2023-05-11 DIAGNOSIS — R002 Palpitations: Secondary | ICD-10-CM | POA: Diagnosis not present

## 2023-05-11 DIAGNOSIS — G4733 Obstructive sleep apnea (adult) (pediatric): Secondary | ICD-10-CM

## 2023-05-11 DIAGNOSIS — E039 Hypothyroidism, unspecified: Secondary | ICD-10-CM

## 2023-05-11 DIAGNOSIS — E782 Mixed hyperlipidemia: Secondary | ICD-10-CM

## 2023-05-11 DIAGNOSIS — I5032 Chronic diastolic (congestive) heart failure: Secondary | ICD-10-CM | POA: Diagnosis not present

## 2023-05-11 MED ORDER — METOPROLOL TARTRATE 25 MG PO TABS: 25.0000 mg | ORAL_TABLET | Freq: Two times a day (BID) | ORAL | 3 refills | Status: DC

## 2023-05-11 MED ORDER — SYNTHROID 100 MCG PO TABS: 100.0000 ug | ORAL_TABLET | Freq: Every day | ORAL | 1 refills | Status: DC

## 2023-05-11 MED ORDER — NITROGLYCERIN 0.4 MG SL SUBL: 0.4000 mg | SUBLINGUAL_TABLET | SUBLINGUAL | 3 refills | Status: DC | PRN

## 2023-05-11 NOTE — Patient Instructions (Signed)
Medication Instructions:  Your physician recommends that you continue on your current medications as directed. Please refer to the Current Medication list given to you today.  *If you need a refill on your cardiac medications before your next appointment, please call your pharmacy*   Lab Work: none If you have labs (blood work) drawn today and your tests are completely normal, you will receive your results only by: MyChart Message (if you have MyChart) OR A paper copy in the mail If you have any lab test that is abnormal or we need to change your treatment, we will call you to review the results.   Testing/Procedures: none  Follow-Up: At [facility], you and your health needs are our priority.  As part of our continuing mission to provide you with exceptional heart care, we have created designated Provider Care Teams.  These Care Teams include your primary Cardiologist (physician) and Advanced Practice Providers (APPs -  Physician Assistants and Nurse Practitioners) who all work together to provide you with the care you need, when you need it.  We recommend signing up for the patient portal called "MyChart".  Sign up information is provided on this After Visit Summary.  MyChart is used to connect with patients for Virtual Visits (Telemedicine).  Patients are able to view lab/test results, encounter notes, upcoming appointments, etc.  Non-urgent messages can be sent to your provider as well.   To learn more about what you can do with MyChart, go to [email or web address].    Your next appointment:   1 year(s)  Provider:   Julien Nordmann, MD or Charlsie Quest, NP

## 2023-05-11 NOTE — Telephone Encounter (Signed)
Copied from CRM [id number]. Topic: General - Other >> May 11, 2023 12:14 PM Dondra Prader E wrote: Reason for CRM: Tameka from [facility] called to report that the nurtec has been approved for one year effective today. She is faxing over information.   Best contact: [phone] option [phone]

## 2023-05-14 ENCOUNTER — Encounter
Admission: RE | Admit: 2023-05-14 | Discharge: 2023-05-14 | Disposition: A | Discharge status: Home / Self Care | Payer: Medicare Other | Source: Ambulatory Visit | Attending: Family Medicine | Admitting: Family Medicine

## 2023-05-14 DIAGNOSIS — Z01818 Encounter for other preprocedural examination: Secondary | ICD-10-CM

## 2023-05-14 HISTORY — DX: Dizziness and giddiness: R42

## 2023-05-14 HISTORY — DX: Selective deficiency of immunoglobulin g (igg) subclasses: D80.3

## 2023-05-14 HISTORY — DX: Interstitial cystitis (chronic) without hematuria: N30.10

## 2023-05-14 HISTORY — DX: Atherosclerosis of aorta: I70.0

## 2023-05-14 HISTORY — DX: Iron deficiency anemia, unspecified: D50.9

## 2023-05-14 HISTORY — DX: Benign neoplasm of colon, unspecified: D12.6

## 2023-05-14 HISTORY — DX: Encephalopathy, unspecified: G93.40

## 2023-05-14 HISTORY — DX: Unspecified chronic bronchitis: J42

## 2023-05-14 HISTORY — DX: Deficiency of other specified B group vitamins: E53.8

## 2023-05-14 NOTE — Patient Instructions (Signed)
Your procedure is scheduled on:05-21-23 Monday Report to the Registration Desk on the 1st floor of the [facility].Then proceed to the 2nd floor Surgery Desk To find out your arrival time, please call [phone] between 1PM - 3PM on:05-18-23 Friday If your arrival time is 6:00 am, do not arrive before that time as the Medical Mall entrance doors do not open until 6:00 am.  REMEMBER: Instructions that are not followed completely may result in serious medical risk, up to and including death; or upon the discretion of your surgeon and anesthesiologist your surgery may need to be rescheduled.  Do not eat food OR drink any liquids after midnight the night before surgery.  No gum chewing or hard candies.  One week prior to surgery: Stop Anti-inflammatories (NSAIDS) such as Advil, Aleve, Ibuprofen, Motrin, Naproxen, Naprosyn and Aspirin based products such as Excedrin, Goody's Powder, BC Powder.You may however, take Tylenol if needed for pain up until the day of surgery. Stop ANY OVER THE COUNTER supplements/vitamins NOW (05-14-23) until after surgery.  Continue taking all prescribed medications with the exception of the following: -TRULICITY-Stop 7 days prior to surgery-Last dose was on 05-09-23-Do NOT take again until AFTER surgery -clopidogrel (PLAVIX)-Stop 5 days prior to surgery-Last dose on 05-15-23 Tuesday -GLYXAMBI-Stop 3 days prior to surgery-Last dose will be on 05-17-23 Thursday   TAKE ONLY THESE MEDICATIONS THE MORNING OF SURGERY WITH A SIP OF WATER: -busPIRone (BUSPAR)  -citalopram (CELEXA)  -gabapentin (NEURONTIN)  -hydrOXYzine (ATARAX)  -lamoTRIgine (LAMICTAL)  -metoprolol tartrate (LOPRESSOR)  -SYNTHROID  -Vibegron (GEMTESA)  -esomeprazole (NEXIUM)-take one the night before and one on the morning of surgery - helps to prevent nausea after surgery.)  Call Dr McDiarmid's office today (05-14-23) to find out when you need to stop your 81 mg Aspirin  Do NOT take any Insulin the  morning of surgery  No Alcohol for 24 hours before or after surgery.  No Smoking including e-cigarettes for 24 hours before surgery.  No chewable tobacco products for at least 6 hours before surgery.  No nicotine patches on the day of surgery.  Do not use any "recreational" drugs for at least a week (preferably 2 weeks) before your surgery.  Please be advised that the combination of cocaine and anesthesia may have negative outcomes, up to and including death. If you test positive for cocaine, your surgery will be cancelled.  On the morning of surgery brush your teeth with toothpaste and water, you may rinse your mouth with mouthwash if you wish. Do not swallow any toothpaste or mouthwash.  Do not wear jewelry, make-up, hairpins, clips or nail polish.  Do not wear lotions, powders, or perfumes.   Do not shave body hair from the neck down 48 hours before surgery.  Contact lenses, hearing aids and dentures may not be worn into surgery.  Do not bring valuables to the hospital. [facility] is not responsible for any missing/lost belongings or valuables.   Notify your doctor if there is any change in your medical condition (cold, fever, infection).  Wear comfortable clothing (specific to your surgery type) to the hospital.  After surgery, you can help prevent lung complications by doing breathing exercises.  Take deep breaths and cough every 1-2 hours. Your doctor may order a device called an Incentive Spirometer to help you take deep breaths. When coughing or sneezing, hold a pillow firmly against your incision with both hands. This is called "splinting." Doing this helps protect your incision. It also decreases belly discomfort.  If you are being admitted to the hospital overnight, leave your suitcase in the car. After surgery it may be brought to your room.  In case of increased patient census, it may be necessary for you, the patient, to continue your postoperative care in the Same  Day Surgery department.  If you are being discharged the day of surgery, you will not be allowed to drive home. You will need a responsible individual to drive you home and stay with you for 24 hours after surgery.   If you are taking public transportation, you will need to have a responsible individual with you.  Please call the Pre-admissions Testing Dept. at [phone] if you have any questions about these instructions.  Surgery Visitation Policy:  Patients having surgery or a procedure may have two visitors.  Children under the age of 62 must have an adult with them who is not the patient.

## 2023-05-15 ENCOUNTER — Encounter: Payer: Self-pay | Admitting: Urgent Care

## 2023-05-15 NOTE — Progress Notes (Signed)
  Perioperative Services Pre-Admission/Anesthesia Testing    Date: 05/15/23  Name: [name] MRN:   [id number]  Re: GLP-1 clearance and provider recommendations   Planned Surgical Procedure(s):    Case: [id number] Date/Time: 05/21/23 1230   Procedures:      REMOVAL OF INTERSTIM IMPLANT     INTERSTIM IMPLANT FIRST STAGE WITH IMPEDANCE CHECK     INTERSTIM IMPLANT SECOND STAGE   Anesthesia type: Monitor Anesthesia Care   Pre-op diagnosis: Malfunctioning Interstim, Refractory Urge Incontinence   Location: [facility] OR ROOM 08 / [facility]S FOR AN[facility]   Surgeons: Alfredo Martinez, MD      Clinical Notes:  Patient is scheduled for the above procedure with the indicated provider/surgeon. In review of her medication reconciliation it was noted that patient is on a prescribed GLP-1 medication. Per guidelines issued by the American Society of Anesthesiologists (ASA), it is recommended that these medications be held for 7 days prior to the patient undergoing any type of elective surgical procedure. The patient is taking the following GLP-1 medication:  []  SEMAGLUTIDE   []  EXENATIDE  []  LIRAGLUTIDE   []  LIXISENATIDE  [x]  DULAGLUTIDE     []  TIRZEPATIDE (GLP-1/GIP)  Reached out to prescribing provider Carlynn Purl, MD) to make them aware of the guidelines from anesthesia. Given that this patient takes the prescribed GLP-1 medication for her  diabetes diagnosis, rather than for weight loss, recommendations from the prescribing provider were solicited. Prescribing provider made aware of the following so that informed decision/POC can be developed for this patient that may be taking medications belonging to these drug classes:  Oral GLP-1 medications will be held 1 day prior to surgery.  Injectable GLP-1 medications will be held 7 days prior to surgery.  Metformin is routinely held 48 hours prior to surgery due to renal concerns, potential need for contrasted imaging perioperatively, and the  potential for tissue hypoxia leading to drug induced lactic acidosis.  All SGLT2i medications are held 72 hours prior to surgery as they can be associated with the increased potential for developing euglycemic diabetic ketoacidosis (EDKA).   Impression and Plan:  [name] is on a prescribed GLP-1 medication, which induces the known side effect of decreased gastric emptying. Efforts are bring made to mitigate the risk of perioperative hyperglycemic events, as elevated Yang glucose levels have been found to contribute to intra/postoperative complications. Additionally, hyperglycemic extremes can potentially necessitate the postponing of a patient's elective case in order to better optimize perioperative glycemic control, again with the aforementioned guidelines in place. With this in mind, recommendations have been sought from the prescribing provider, who has cleared patient to proceed with holding the prescribed GLP-1 as per the guidelines from the ASA.   Provider recommending: no further recommendations received from the prescribing provider.  Copy of signed clearance and recommendations placed on patient's chart for inclusion in their medical record and for review by the surgical/anesthetic team on the day of her procedure.   Quentin Mulling, MSN, APRN, FNP-C, CEN [facility]  Peri-operative Services Nurse Practitioner Phone: [phone] Fax: [phone] 05/15/23 1:19 PM  NOTE: This note has been prepared using Scientist, clinical (histocompatibility and immunogenetics). Despite my best ability to proofread, there is always the potential that unintentional transcriptional errors may still occur from this process.

## 2023-05-15 NOTE — Progress Notes (Unsigned)
Name: [name]   MRN: [id number]    DOB: 06-12-1966   Date:05/16/2023       Progress Note  Subjective  Chief Complaint  Follow Up  HPI  She came in today for pre op clearance for Urological surgery scheduled for 05/21/2023 . Requested by Dr. Sherron Monday   Patient's A1C was very high, over 10 %, discussed risk of infection and the need to post-pone surgery. She has been eating smaller portions and avoiding sweet beverages and candy since last week, glucose at home is down from 300's to 200's, she is still having polyphagia, polydipsia and polyuria but not as severe. She has a visit scheduled with Endo for October 4th therefore we will not make any adjustments on her current regiment.   Hypothyroidism: TSH suppressed, advised to change medication dose from 100 mcg daily to one daily and half on Sundays and recheck levels a few days prior to her next visit   Patient Active Problem List   Diagnosis Date Noted   Insulin dependent type 2 diabetes mellitus (HCC) 03/19/2023   Bacteremia due to Escherichia coli 09/13/2022   Encephalopathy acute 09/13/2022   Severe sepsis (HCC) 09/10/2022   Vertigo 02/10/2022   Abnormal urinalysis 02/10/2022   TIA (transient ischemic attack) 10/04/2021   HTN (hypertension) 10/04/2021   Chronic kidney disease, stage 3a (HCC) 10/04/2021   Depression with anxiety 10/04/2021   COPD (chronic obstructive pulmonary disease) (HCC) 10/04/2021   CAD (coronary artery disease) 10/04/2021   Chronic diastolic CHF (congestive heart failure) (HCC) 10/04/2021   Type II diabetes mellitus with renal manifestations (HCC) 06/30/2021   Interstitial cystitis 06/30/2021   Atherosclerosis of aorta (HCC) 03/28/2021   Iron deficiency anemia due to chronic blood loss    Polyp of ascending colon    Vitamin B12 deficiency 08/08/2018   Patient is full code 10/30/2017   Coronary artery disease of native artery of native heart with stable angina pectoris (HCC) 10/09/2017   Plantar  fascial fibromatosis 10/09/2017   Tendinitis of wrist 10/09/2017   Schizoaffective disorder, bipolar type (HCC) 09/28/2017   Adenomatous colon polyp 10/15/2015   Bipolar affective disorder (HCC) 10/15/2015   CN (constipation) 10/15/2015   HLD (hyperlipidemia) 10/15/2015   NASH (nonalcoholic steatohepatitis) 10/15/2015   Obesity (BMI 35.0-39.9 without comorbidity) 10/15/2015   Restless leg syndrome 10/15/2015   Low back pain with left-sided sciatica 08/18/2015   Apnea, sleep 05/05/2015   Heart valve disease 05/05/2015   Complicated UTI (urinary tract infection) 03/24/2015   CKD stage 3 due to type 2 diabetes mellitus (HCC) 03/15/2015   Migraine 03/12/2015   Chronic bronchitis (HCC) 01/20/2015   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HCC) 01/20/2015   GERD (gastroesophageal reflux disease) 01/20/2015   Hypothyroid    Vitamin D deficiency    Urinary incontinence    Liver lesion    Hypomagnesemia    Obstructive sleep apnea 10/20/2013   Allergic rhinitis 07/04/2007    Past Surgical History:  Procedure Laterality Date   ABDOMINAL HYSTERECTOMY     Partial   bladder stim  2007   BLADDER SURGERY     x 2. tacking of bladder and the other was the stimulator placement   CHOLECYSTECTOMY     COLONOSCOPY WITH PROPOFOL N/A 03/04/2019   Procedure: COLONOSCOPY WITH PROPOFOL;  Surgeon: Midge Minium, MD;  Location: [facility] ENDOSCOPY;  Service: Endoscopy;  Laterality: N/A;   ESOPHAGOGASTRODUODENOSCOPY (EGD) WITH PROPOFOL N/A 03/04/2019   Procedure: ESOPHAGOGASTRODUODENOSCOPY (EGD) WITH PROPOFOL;  Surgeon: Midge Minium, MD;  Location: [facility] ENDOSCOPY;  Service: Endoscopy;  Laterality: N/A;   INCISION AND DRAINAGE ABSCESS Right 04/16/2019   Procedure: INCISION AND DRAINAGE ABSCESS;  Surgeon: Sung Amabile, DO;  Location: [facility] ORS;  Service: General;  Laterality: Right;   INTERSTIM-GENERATOR CHANGE N/A 12/31/2017   Procedure: INTERSTIM-GENERATOR CHANGE;  Surgeon: Alfredo Martinez, MD;  Location: [facility] ORS;  Service:  Urology;  Laterality: N/A;   LIVER BIOPSY  March 2016   Negative   LUNG BIOPSY  2015   small spot on lung   TUBAL LIGATION     UPPER GI ENDOSCOPY     WRIST SURGERY Right 11/02/2019    Family History  Problem Relation Age of Onset   Diabetes Mother    Heart disease Mother    Hyperlipidemia Mother    Hypertension Mother    Kidney disease Mother    Mental illness Mother    Hypothyroidism Mother    Stroke Mother    Osteoporosis Mother    Glaucoma Mother    Congestive Heart Failure Mother    Hypertension Father    Kidney disease Brother    Intracerebral hemorrhage Brother    Breast cancer Maternal Grandmother 60   Heart disease Maternal Grandfather    Rheum arthritis Maternal Grandfather    Cancer Maternal Grandfather        liver   Kidney disease Maternal Grandfather    Cancer Paternal Grandmother        lung   Asthma Daughter    Cancer Daughter        throat   Bipolar disorder Daughter    Bipolar disorder Daughter    Hypertension Daughter    Restless legs syndrome Daughter     Social History   Tobacco Use   Smoking status: Never   Smokeless tobacco: Never  Substance Use Topics   Alcohol use: No     Current Outpatient Medications:    aspirin EC 81 MG tablet, Take 81 mg by mouth daily. Swallow whole., Disp: , Rfl:    atorvastatin (LIPITOR) 80 MG tablet, Take 1 tablet (80 mg total) by mouth at bedtime., Disp: 90 tablet, Rfl: 1   busPIRone (BUSPAR) 30 MG tablet, Take 30 mg by mouth 2 (two) times daily., Disp: , Rfl:    cetirizine (ZYRTEC) 10 MG tablet, Take 1 tablet (10 mg total) by mouth daily. (Patient taking differently: Take 10 mg by mouth at bedtime.), Disp: 30 tablet, Rfl: 0   citalopram (CELEXA) 20 MG tablet, Take 20 mg by mouth every morning., Disp: , Rfl:    clopidogrel (PLAVIX) 75 MG tablet, Take 75 mg by mouth every morning., Disp: , Rfl:    esomeprazole (NEXIUM) 40 MG capsule, Take 1 capsule (40 mg total) by mouth every morning. MUST SCHEDULE OFFICE  VISIT, Disp: 30 capsule, Rfl: 0   ezetimibe (ZETIA) 10 MG tablet, Take 1 tablet (10 mg total) by mouth at bedtime., Disp: 90 tablet, Rfl: 1   fenofibrate micronized (LOFIBRA) 134 MG capsule, Take 1 capsule (134 mg total) by mouth at bedtime., Disp: 90 capsule, Rfl: 1   fluticasone (FLONASE) 50 MCG/ACT nasal spray, Place 2 sprays into both nostrils daily., Disp: 48 g, Rfl: 0   gabapentin (NEURONTIN) 400 MG capsule, Take 400 mg by mouth 2 (two) times daily., Disp: , Rfl:    GLYXAMBI 25-5 MG TABS, Take 1 tablet by mouth every morning., Disp: , Rfl:    GVOKE HYPOPEN 2-PACK 1 MG/0.2ML SOAJ, Inject 1 mg into the skin as needed (for  hypoglycemia episodes (glucose below 60 and symptomatic))., Disp: 0.4 mL, Rfl: 1   HUMALOG KWIKPEN 100 UNIT/ML KwikPen, Inject into the skin as needed. This is a SS-pt unsure of dosages of SS, Disp: , Rfl:    HUMULIN R U-500 KWIKPEN 500 UNIT/ML kwikpen, Inject 70 Units into the skin 3 (three) times daily with meals., Disp: , Rfl:    hydrOXYzine (ATARAX) 50 MG tablet, Take 50 mg by mouth 3 (three) times daily., Disp: , Rfl:    Insulin Pen Needle (PEN NEEDLES) 31G X 8 MM MISC, , Disp: , Rfl:    lamoTRIgine (LAMICTAL) 200 MG tablet, Take 200 mg by mouth 2 (two) times daily. , Disp: , Rfl:    linaclotide (LINZESS) 145 MCG CAPS capsule, TAKE 1 CAPSULE BY MOUTH DAILY BEFORE BREAKFAST (Patient taking differently: Take 145 mcg by mouth as needed.), Disp: 30 capsule, Rfl: 3   meclizine (ANTIVERT) 50 MG tablet, Take 1 tablet (50 mg total) by mouth 3 (three) times daily as needed., Disp: 30 tablet, Rfl: 2   metoprolol tartrate (LOPRESSOR) 25 MG tablet, Take 1 tablet (25 mg total) by mouth 2 (two) times daily., Disp: 90 tablet, Rfl: 3   nitroGLYCERIN (NITROSTAT) 0.4 MG SL tablet, Place 1 tablet (0.4 mg total) under the tongue every 5 (five) minutes as needed for chest pain., Disp: 25 tablet, Rfl: 3   ondansetron (ZOFRAN-ODT) 4 MG disintegrating tablet, Take 1 tablet (4 mg total) by mouth  every 8 (eight) hours as needed., Disp: 20 tablet, Rfl: 0   OneTouch Delica Lancets 30G MISC, by Does not apply route., Disp: , Rfl:    QUEtiapine (SEROQUEL) 300 MG tablet, Take 300 mg by mouth at bedtime. Along with 200mg  dose for a total of 500mg , Disp: , Rfl:    Rimegepant Sulfate (NURTEC) 75 MG TBDP, Take 1 tablet (75 mg total) by mouth every other day., Disp: 30 tablet, Rfl: 2   SUMAtriptan (IMITREX) 100 MG tablet, TAKE 1 TABLET AT ONSET OF MIGRAINE. MAY REPEAT IN 2 HOURS IF HEADACHE PERSISTS OR RECURS. (Patient taking differently: Take by mouth every 2 (two) hours as needed. TAKE 1 TABLET AT ONSET OF MIGRAINE. MAY REPEAT IN 2 HOURS IF HEADACHE PERSISTS OR RECURS.), Disp: 10 tablet, Rfl: 0   SYNTHROID 100 MCG tablet, Take 1 tablet (100 mcg total) by mouth daily. And half on Sundays, recheck level in 6 weeks (Patient taking differently: Take 100 mcg by mouth daily before breakfast. And half on Sundays, recheck level in 6 weeks), Disp: 30 tablet, Rfl: 1   TRULICITY 1.5 MG/0.5ML SOPN, Inject 1.5 mg into the skin once a week. (Wednesdays), Disp: , Rfl:    Vibegron (GEMTESA) 75 MG TABS, Take 1 tablet (75 mg total) by mouth daily. (Patient taking differently: Take 75 mg by mouth every morning.), Disp: 30 tablet, Rfl: 0  Allergies  Allergen Reactions   Mirtazapine Anaphylaxis    REACTION: closes throat   Morphine And Codeine     anaphylaxis   Sulfamethoxazole-Trimethoprim Rash    REACTION: Whelps all over   Amoxicillin Er Nausea And Vomiting    Pt doesn't remember problem with amoxicillin -  SE Has patient had a PCN reaction causing immediate rash, facial/tongue/throat swelling, SOB or lightheadedness with hypotension: No Has patient had a PCN reaction causing severe rash involving mucus membranes or skin necrosis: No Has patient had a PCN reaction that required hospitalization: No Has patient had a PCN reaction occurring within the last 10 years: no If all  of the above answers are "NO", then  may proceed with Cephalosporin use.   Codeine Itching    REACTION: itch, rash   Hydrocodone-Acetaminophen Rash    REACTION: rash   Keflex [Cephalexin] Rash    Pt does not remember having a reaction or rash, no airway involvement   Prasterone Nausea And Vomiting    I personally reviewed active problem list, medication list, allergies, family history, social history, health maintenance with the patient/caregiver today.   ROS  Ten systems reviewed and is negative except as mentioned in HPI    Objective  Vitals:   05/16/23 1058  BP: 128/76  Pulse: 72  Resp: 16  SpO2: 95%  Weight: 186 lb (84.4 kg)  Height: 4\' 11"  (1.499 m)    Body mass index is 37.57 kg/m.  Physical Exam  Constitutional: Patient appears well-developed and well-nourished. Obese No distress.  HEENT: head atraumatic, normocephalic, pupils equal and reactive to light, neck supple Cardiovascular: Normal rate, regular rhythm and normal heart sounds.  No murmur heard. No BLE edema. Pulmonary/Chest: Effort normal and breath sounds normal. No respiratory distress. Abdominal: Soft.  There is no tenderness. Psychiatric: Patient has a normal mood and affect. behavior is normal. Judgment and thought content normal.    PHQ2/9:    05/16/2023   11:02 AM 05/09/2023   11:49 AM 03/19/2023    8:39 AM 11/17/2022    9:52 AM 11/02/2022   11:37 AM  Depression screen PHQ 2/9  Decreased Interest 2 2 2  0 0  Down, Depressed, Hopeless 1 2 1  0 0  PHQ - 2 Score 3 4 3  0 0  Altered sleeping 0 0 0    Tired, decreased energy 0 3 0    Change in appetite 1 0 0    Feeling bad or failure about yourself  0 2 0    Trouble concentrating 2 2 1     Moving slowly or fidgety/restless 0 0 0    Suicidal thoughts 0 0 0    PHQ-9 Score 6 11 4     Difficult doing work/chores  Not difficult at all Somewhat difficult      phq 9 is positive   Fall Risk:    05/16/2023   10:58 AM 05/09/2023   11:00 AM 03/19/2023    8:39 AM 11/17/2022    9:51 AM  11/02/2022   11:37 AM  Fall Risk   Falls in the past year? 1 1 1 1 1   Number falls in past yr: 1 1 1 1 1   Comment    10   Injury with Fall? 0 0 0 1 0  Risk for fall due to : Impaired balance/gait History of fall(s) Impaired balance/gait History of fall(s);Impaired balance/gait;Impaired mobility History of fall(s);Impaired balance/gait;Impaired mobility  Follow up Falls prevention discussed Falls prevention discussed;Education provided;Falls evaluation completed Falls prevention discussed;Education provided;Falls evaluation completed        Functional Status Survey: Is the patient deaf or have difficulty hearing?: No Does the patient have difficulty seeing, even when wearing glasses/contacts?: No Does the patient have difficulty concentrating, remembering, or making decisions?: Yes Does the patient have difficulty walking or climbing stairs?: Yes Does the patient have difficulty dressing or bathing?: No Does the patient have difficulty doing errands alone such as visiting a doctor's office or shopping?: Yes    Assessment & Plan  1. Insulin dependent type 2 diabetes mellitus (HCC)  - Fructosamine  2. Needs flu shot  - Flu vaccine trivalent PF, 6mos and older(Flulaval,Afluria,Fluarix,Fluzone)  3. Adult hypothyroidism  Discussed dose adjustment

## 2023-05-16 ENCOUNTER — Encounter: Payer: Self-pay | Admitting: Family Medicine

## 2023-05-16 ENCOUNTER — Ambulatory Visit (INDEPENDENT_AMBULATORY_CARE_PROVIDER_SITE_OTHER): Payer: Medicare Other | Admitting: Family Medicine

## 2023-05-16 ENCOUNTER — Encounter: Payer: Self-pay | Admitting: Urology

## 2023-05-16 VITALS — BP 128/76 | HR 72 | Resp 16 | Ht 59.0 in | Wt 186.0 lb

## 2023-05-16 DIAGNOSIS — Z23 Encounter for immunization: Secondary | ICD-10-CM

## 2023-05-16 DIAGNOSIS — Z794 Long term (current) use of insulin: Secondary | ICD-10-CM | POA: Diagnosis not present

## 2023-05-16 DIAGNOSIS — E039 Hypothyroidism, unspecified: Secondary | ICD-10-CM

## 2023-05-16 DIAGNOSIS — E119 Type 2 diabetes mellitus without complications: Secondary | ICD-10-CM

## 2023-05-16 NOTE — Progress Notes (Signed)
Perioperative / Anesthesia Services  Pre-Admission Testing Clinical Review / Pre-Operative Anesthesia Consult  Date: 05/18/23  Patient Demographics:  Name: [name] DOB:   1966/07/11 MRN:   [id number]  Planned Surgical Procedure(s):    Case: [id number] Date/Time: 05/21/23 1230   Procedures:      REMOVAL OF INTERSTIM IMPLANT     INTERSTIM IMPLANT FIRST STAGE WITH IMPEDANCE CHECK     INTERSTIM IMPLANT SECOND STAGE   Anesthesia type: Monitor Anesthesia Care   Pre-op diagnosis: Malfunctioning Interstim, Refractory Urge Incontinence   Location: [facility] OR ROOM 08 / [facility]   Surgeons: Alfredo Martinez, MD     NOTE: Available PAT nursing documentation and vital signs have been reviewed. Clinical nursing staff has updated patient's PMH/PSHx, current medication list, and drug allergies/intolerances to ensure comprehensive history available to assist in medical decision making as it pertains to the aforementioned surgical procedure and anticipated anesthetic course. Extensive review of available clinical information personally performed.  PMH and PSHx updated with any diagnoses/procedures that  may have been inadvertently omitted during her intake with the pre-admission testing department's nursing staff.  Clinical Discussion:  [name] is a 57 y.o. female who is submitted for pre-surgical anesthesia review and clearance prior to her undergoing the above procedure. Patient has never been a smoker. Pertinent PMH includes: CAD, ? MI, HFpEF, TIA, tachycardia, palpitations, PVD, aortic atherosclerosis, HTN, HLD, T2DM, hypothyroidism, COPD, asthma, OSAH (no nocturnal PAP therapy), GERD (on daily PPI), gastric ulcer, IDA, NASH, interstitial cystitis, OA, diabetic neuropathy, schizoaffective disorder, bipolar depression, anxiety, recurrent falls secondary to severe vertigo.  Patient is followed by cardiology Mariah Milling, MD). She was last seen in the cardiology clinic  on 05/11/2023; notes reviewed. At the time of her clinic visit, patient doing well overall from a cardiovascular perspective.  Patient's main complaint during the visit was extreme vertigo.  Patient had experienced several falls at home resulting in visits to the emergency department.  Patient with infrequent episodes of palpitations.  Patient denied any chest pain, shortness of breath, PND, orthopnea,, significant peripheral edema, weakness, fatigue, or presyncope/syncope. Patient with a past medical history significant for cardiovascular diagnoses. Documented physical exam was grossly benign, providing no evidence of acute exacerbation and/or decompensation of the patient's known cardiovascular conditions.  Of note, complete records regarding patient's cardiovascular history unavailable for review at time of consult.  Information gathered from available records and from patient interview.  Patient reported to have suffered an MI in 2009.  Unable to locate any information regarding this cardiovascular event.  I did not see myocardial infarction listed as part of her history with cardiology.  Details unclear at this time.  Patient underwent diagnostic LEFT cardiac catheterization on 04/13/2011 revealing normal coronary anatomy with no evidence of obstructive coronary artery disease.  Myocardial perfusion imaging study was performed on 10/29/2020 revealing a normal left ventricular systolic function with an EF of 65%.  There were no regional wall motion abnormalities.  CT attenuation correction images demonstrated mild aortic atherosclerosis and moderate proximal to mid LAD disease.  There was no evidence of stress-induced myocardial ischemia or arrhythmia; no scintigraphic evidence of scar.  Study determined to be normal and low risk.  Patient began experiencing TIA symptoms in early 2023.  Symptom constellation has included AMS, gait abnormalities, recurrent falls, and changes to her speech.  Multiple CT  scans (12 in total) of the brain were performed between 09/2021 and 04/2023. Each imaging study was found to be unrevealing  with no significant intracranial abnormalities. Neurology was consulted and patient was started on daily DAPT per their recommendation in 10/2021.  Long-term cardiac event monitor study performed on 11/24/2021 revealing a predominant underlying normal sinus rhythm at an average rate of 85 bpm; range 56-127 bpm.  There were no significant arrhythmias or prolonged pauses noted.  Most recent TTE with bubble study was performed on 02/12/2022 revealing normal left ventricular systolic function with an EF of 55 to 60%.  There were no regional wall motion abnormalities.  Mild LVH noted. Left ventricular diastolic Doppler parameters consistent with abnormal relaxation (G1DD).  Right ventricular size and function normal.  All transvalvular gradients were noted to be normal providing no evidence suggestive of valvular stenosis.  Aorta normal in size with no evidence of aneurysmal dilatation.  Agitated saline contrast bubble study was negative with no evidence of intra-atrial shunting.  Patient remains on daily DAPT therapy using ASA + clopidogrel.  Patient reportedly compliant with therapy with no evidence reports of GI/GU related bleeding. Blood pressure reasonably controlled at 138/84 mmHg on currently prescribed beta-blocker (metoprolol tartrate) monotherapy.  Patient using the beta-blocker more for control of her palpitations rather than her blood pressure.  She has been out of this medication for over 2 weeks and blood pressure noted to still be relatively well-controlled. Patient is on atorvastatin + ezetimibe + fenofibrate for her HLD diagnosis and ASCVD prevention.  Patient has a supply of short acting nitrates (NTG) to use on a as needed basis for recurrent angina/anginal equivalent symptoms; denied recent use.  T2DM poorly controlled on currently prescribed regimen; last HgbA1c was 10.4%  when checked on 05/09/2023. Patient does have an OSAH diagnosis, however he does not use nocturnal PAP therapy.  Patient does experience nocturnal hypoxia, thus she requires the use of supplemental oxygen (2L/[place]).  Functional capacity somewhat limited by patient's chronic medical comorbidities.  With that said, she is still able to complete her ADLs/IADLs without significant cardiovascular limitation.  Per the DASI, patient is able to achieve at least 4 METS of physical activity without experiencing any significant degree of angina/anginal equivalent symptoms.  Metoprolol was refilled and patient was advised to restart.  No changes were made to her medication regimen.  Patient to follow-up with outpatient cardiology in 1 year or sooner if needed.  Fran Lowes Boyers is scheduled for an REMOVAL OF INTERSTIM IMPLANT; INTERSTIM IMPLANT FIRST STAGE WITH IMPEDANCE CHECK; INTERSTIM IMPLANT SECOND STAGE on 05/21/2023 with Dr. Alfredo Martinez, MD.  Given patient's past medical history significant for cardiovascular diagnoses, presurgical cardiac clearance was sought by the PAT team.  Additionally, it appears as if surgeons office requested medical clearance from patient's primary care provider.  Clearances were obtained as follows.  Per internal medicine Carlynn Purl, MD), "patient's A1C was very high, over 10 %, discussed risk of infection and the need to post-pone surgery. She has been eating smaller portions and avoiding sweet beverages and candy since last week, glucose at home is down from 300's to 200's, she is still having polyphagia, polydipsia and polyuria but not as severe. She has a visit scheduled with Endo for October 4th therefore we will not make any adjustments on her current regimen".  Per cardiology ([place], NP-C), "patient denies shortness of breath, changes in chronic chest discomfort and she states that her chest discomfort has been decreased with a decrease in her palpitations.  She also thinks that her  nerve stimulator has an impact on her discomfort as well.  She is scheduled  to undergo upcoming procedure movable of InterStim implant, stage I and II InterStim placement with impedance check by Dr. Sherron Monday. According to ACC/AHA guidelines no further cardiovascular testing stating the patient may proceed to surgery at ACCEPTABLE risk".  Again, this patient is on daily DAPT therapy using ASA + clopidogrel.  He has been instructed on recommendations for holding these medications for 5 days prior to his procedure with plans to restart as soon as postoperative bleeding risk felt to be minimized by his primary attending surgeon.  The patient is aware that his last dose of his ASA and clopidogrel should be on 05/15/2023.    Patient denies previous perioperative complications with anesthesia in the past. In review of the available records, it is noted that patient underwent a general anesthetic course here at [facility] ([facility]) in 04/2019 without documented complications.      05/16/2023   10:58 AM 05/11/2023    8:15 AM 05/11/2023    7:53 AM  Vitals with BMI  Height 4\' 11"   4\' 11"   Weight 186 lbs  185 lbs 10 oz  BMI 37.55  37.47  Systolic 128 138 409  Diastolic 76 84 99  Pulse 72  82    Providers/Specialists:   NOTE: Primary physician provider listed below. Patient may have been seen by APP or partner within same practice.   PROVIDER ROLE / SPECIALTY LAST Maricela Bo, MD Urology (Surgeon) 03/05/2023  Alba Cory, MD Primary Care Provider 05/16/2023  Julien Nordmann, MD Cardiology 05/11/2023  Wendall Mola, MD Endocrinology ??; being seen 06/08/2023 per Dr. Carlynn Purl' note   Allergies:  Mirtazapine, Morphine and codeine, Sulfamethoxazole-trimethoprim, Amoxicillin er, Codeine, Hydrocodone-acetaminophen, Keflex [cephalexin], and Prasterone  Current Home Medications:   No current facility-administered medications for this encounter.    aspirin EC  81 MG tablet   atorvastatin (LIPITOR) 80 MG tablet   busPIRone (BUSPAR) 30 MG tablet   cetirizine (ZYRTEC) 10 MG tablet   citalopram (CELEXA) 20 MG tablet   clopidogrel (PLAVIX) 75 MG tablet   esomeprazole (NEXIUM) 40 MG capsule   ezetimibe (ZETIA) 10 MG tablet   fenofibrate micronized (LOFIBRA) 134 MG capsule   fluticasone (FLONASE) 50 MCG/ACT nasal spray   gabapentin (NEURONTIN) 400 MG capsule   GLYXAMBI 25-5 MG TABS   GVOKE HYPOPEN 2-PACK 1 MG/0.2ML SOAJ   HUMALOG KWIKPEN 100 UNIT/ML KwikPen   HUMULIN R U-500 KWIKPEN 500 UNIT/ML kwikpen   hydrOXYzine (ATARAX) 50 MG tablet   Insulin Pen Needle (PEN NEEDLES) 31G X 8 MM MISC   lamoTRIgine (LAMICTAL) 200 MG tablet   linaclotide (LINZESS) 145 MCG CAPS capsule   meclizine (ANTIVERT) 50 MG tablet   metoprolol tartrate (LOPRESSOR) 25 MG tablet   nitroGLYCERIN (NITROSTAT) 0.4 MG SL tablet   ondansetron (ZOFRAN-ODT) 4 MG disintegrating tablet   OneTouch Delica Lancets 30G MISC   QUEtiapine (SEROQUEL) 300 MG tablet   Rimegepant Sulfate (NURTEC) 75 MG TBDP   SUMAtriptan (IMITREX) 100 MG tablet   SYNTHROID 100 MCG tablet   TRULICITY 1.5 MG/0.5ML SOPN   Vibegron (GEMTESA) 75 MG TABS   History:   Past Medical History:  Diagnosis Date   Adenomatous colon polyp    Anxiety    Arthritis    Asthma    Atherosclerosis of aorta (HCC)    Bacteremia due to Escherichia coli 09/10/2022   Bipolar depression (HCC)    CHF (congestive heart failure) (HCC)    a.) TTE 10/05/2021: EF 60-65%, no RWMAs, norm  RVSF, G1DD; b.) TTE with bubble study 02/12/2022: EF 55-60%, mild LVH, no RWMAs, no IAS, G1DD   Chronic bronchitis (HCC)    CKD (chronic kidney disease), stage III (HCC)    Constipation    a.) on linaclotide   COPD (chronic obstructive pulmonary disease) (HCC)    Coronary artery disease    a.) LHC 04/13/2011: normal coronaries; no CAD; b.) MV 10/29/2020: mod p-mLAD disease   Depression    Diabetic neuropathy (HCC)    Dyspnea     Encephalopathy acute    Gastric ulcer    GERD (gastroesophageal reflux disease)    Headache    Heart attack (HCC) 2009   Hepatomegaly    History of 2019 novel coronavirus disease (COVID-19) 04/28/2020   PCR (+): 04/28/2020, 02/14/2021   Hyperlipidemia    Hypertension    Hypomagnesemia    Hypothyroid    IC (interstitial cystitis)    IDA (iron deficiency anemia)    IgG deficiency (HCC)    Liver lesion    Favored to be benign on CT; MRI of abd w/contract in Aug 2016. Liver Biopsy-Negative, March 2016   Long term current use of aspirin    Long term current use of clopidogrel    Migraines    Morbid obesity (HCC)    NASH (nonalcoholic steatohepatitis)    OSA (obstructive sleep apnea)    Palpitation    Peripheral vascular disease (HCC)    Pneumonia    Recurrent falls    RLS (restless legs syndrome)    Schizoaffective disorder, bipolar type (HCC)    Seasonal allergies    Sleep apnea    uses oxygen at night 2L d/t cpap did not fit   Tachycardia    TIA (transient ischemic attack) 2023   a.) on DAPT therapy per neurology recommendations; started 10/2021   Type 2 diabetes, uncontrolled, with neuropathy 10/15/2015   Overview:  Microalbumin 7.7 12/2010: Hgb A1c 10.2% 12/2010 and 9.7% 03/2011, eye exam 10/2009 [facility]    Urinary incontinence    Vertigo    Vitamin B12 deficiency    Vitamin D deficiency    Past Surgical History:  Procedure Laterality Date   ABDOMINAL HYSTERECTOMY     Partial   bladder stim  2007   BLADDER SURGERY     x 2. tacking of bladder and the other was the stimulator placement   CHOLECYSTECTOMY     COLONOSCOPY WITH PROPOFOL N/A 03/04/2019   Procedure: COLONOSCOPY WITH PROPOFOL;  Surgeon: Midge Minium, MD;  Location: [facility] ENDOSCOPY;  Service: Endoscopy;  Laterality: N/A;   ESOPHAGOGASTRODUODENOSCOPY (EGD) WITH PROPOFOL N/A 03/04/2019   Procedure: ESOPHAGOGASTRODUODENOSCOPY (EGD) WITH PROPOFOL;  Surgeon: Midge Minium, MD;  Location: [facility] ENDOSCOPY;  Service:  Endoscopy;  Laterality: N/A;   INCISION AND DRAINAGE ABSCESS Right 04/16/2019   Procedure: INCISION AND DRAINAGE ABSCESS;  Surgeon: Sung Amabile, DO;  Location: [facility] ORS;  Service: General;  Laterality: Right;   INTERSTIM-GENERATOR CHANGE N/A 12/31/2017   Procedure: INTERSTIM-GENERATOR CHANGE;  Surgeon: Alfredo Martinez, MD;  Location: [facility] ORS;  Service: Urology;  Laterality: N/A;   LEFT HEART CATH AND CORONARY ANGIOGRAPHY Left 04/13/2011   Procedure: LEFT HEART CATH AND CORONARY ANGIOGRAPHY; Location: [facility]; Surgeon: Arnoldo Hooker, MD   LIVER BIOPSY  11/2014   Negative   LUNG BIOPSY  2015   small spot on lung   TUBAL LIGATION     UPPER GI ENDOSCOPY     WRIST SURGERY Right 11/02/2019   Family History  Problem Relation  Age of Onset   Diabetes Mother    Heart disease Mother    Hyperlipidemia Mother    Hypertension Mother    Kidney disease Mother    Mental illness Mother    Hypothyroidism Mother    Stroke Mother    Osteoporosis Mother    Glaucoma Mother    Congestive Heart Failure Mother    Hypertension Father    Kidney disease Brother    Intracerebral hemorrhage Brother    Breast cancer Maternal Grandmother 30   Heart disease Maternal Grandfather    Rheum arthritis Maternal Grandfather    Cancer Maternal Grandfather        liver   Kidney disease Maternal Grandfather    Cancer Paternal Grandmother        lung   Asthma Daughter    Cancer Daughter        throat   Bipolar disorder Daughter    Bipolar disorder Daughter    Hypertension Daughter    Restless legs syndrome Daughter    Social History   Tobacco Use   Smoking status: Never   Smokeless tobacco: Never  Vaping Use   Vaping status: Never Used  Substance Use Topics   Alcohol use: No   Drug use: No    Pertinent Clinical Results:  LABS:   Lab Results  Component Value Date   WBC 7.4 05/09/2023   HGB 12.4 05/09/2023   HCT 38.3 05/09/2023   MCV 90.3 05/09/2023   PLT 233 05/09/2023   Lab Results   Component Value Date   NA 140 05/09/2023   K 4.3 05/09/2023   CO2 25 05/09/2023   GLUCOSE 187 (H) 05/09/2023   BUN 33 (H) 05/09/2023   CREATININE 1.33 (H) 05/09/2023   CALCIUM 9.9 05/09/2023   EGFR 46 (L) 05/09/2023   GFRNONAA 58 (L) 03/27/2023   Lab Results  Component Value Date   HGBA1C 10.4 (H) 05/09/2023    ECG: Date: 05/11/2023 Time ECG obtained: 0800 AM Rate: 82 bpm Rhythm: normal sinus Axis (leads I and aVF): Left axis deviation Intervals: PR 174 ms. QRS 96 ms. QTc 464 ms. ST segment and T wave changes: No evidence of acute ST segment elevation or depression.  Comparison: Similar to previous tracing obtained on 03/27/2023   IMAGING / PROCEDURES: CT RENAL STONE STUDY performed on 02/05/2023 No acute findings in the abdomen or pelvis, no change from prior exam. No renal stones or obstructive uropathy. Moderate volume of formed stool throughout the colon, can be seen with constipation. Given patient reports diarrhea, this may be related to overflow. Hepatomegaly and hepatic steatosis. Aortic atherosclerosis  ECHOCARDIOGRAM COMPLETE BUBBLE STUDY performed on 02/12/2022 Left ventricular ejection fraction, by estimation, is 55 to 60%. The left ventricle has normal function. The left ventricle has no regional wall motion abnormalities. There is mild left ventricular hypertrophy. Left ventricular diastolic parameters are consistent with Grade I diastolic dysfunction (impaired relaxation).  Right ventricular systolic function is normal. The right ventricular size is normal. Tricuspid regurgitation signal is inadequate for assessing PA pressure.  The mitral valve is normal in structure. No evidence of mitral valve regurgitation. No evidence of mitral stenosis.  The aortic valve is normal in structure. Aortic valve regurgitation is not visualized. No aortic stenosis is present.  The inferior vena cava is normal in size with greater than 50% respiratory variability, suggesting  right atrial pressure of 3 mmHg.  Agitated saline contrast bubble study was negative, with no evidence of any interatrial shunt.  LONG TERM CARDIAC EVENT MONITOR STUDY performed on 11/24/2021 Patch Wear Time:  16 days and 23 hours (2023-02-21T12:06:28-0500 to 2023-03-15T06:10:19-0400) Monitor 1 Normal sinus rhythm Patient had a min HR of 56 bpm, max HR of 127 bpm, and avg HR of 85 bpm.  Isolated SVEs were rare (<1.0%), and no SVE Couplets or SVE Triplets were present.  No Isolated VEs, VE Couplets, or VE Triplets were present.  No patient triggered events Monitor 2 Normal sinus rhythm Patient had a min HR of 55 bpm, max HR of 124 bpm, and avg HR of 86 bpm.  Isolated SVEs were rare (<1.0%), and no SVE Couplets or SVE Triplets were present. Isolated VEs were rare (<1.0%), and no VE Couplets or VE Triplets were present.  No patient triggered events  MYOCARDIAL PERFUSION IMAGING STUDY (LEXISCAN) performed on 10/29/2020 Pharmacological myocardial perfusion imaging study with no significant  ischemia Normal wall motion EF estimated at 65% No EKG changes concerning for ischemia at peak stress or in recovery. CT attenuation correction images with mild aortic atherosclerosis, moderate proximal to mid LAD disease. Low risk scan   Impression and Plan:  [name] has been referred for pre-anesthesia review and clearance prior to her undergoing the planned anesthetic and procedural courses. Available labs, pertinent testing, and imaging results were personally reviewed by me in preparation for upcoming operative/procedural course. [facility] medical record has been updated following extensive record review and patient interview with PAT staff.   Patient has been appropriately cleared by cardiology with an overall ACCEPTABLE risk of experiencing significant perioperative cardiovascular complications.  Internal medicine not able to clear patient citing increased risk of complications  associated with her uncontrolled T2DM. Reached out to surgeon's office to discuss, as the clearance was initially requested by their office. Per surgery scheduler Bland Span, CMA), "she had cardiac clearance. Her Interstim doesn't work and needs replacing. She is motivated to have this done". Information from visit with PCP to be discussed with primary attending surgeon (05/16/2023) in order to determine if case will proceed or need to be postponed pending optimization of her diabetes. As of 0945 on 05/18/2023, no follow up communication from surgeon regarding case.   Based on clinical review performed today (05/18/23), barring any significant acute changes in the patient's overall condition, it is anticipated that she will be able to proceed with the planned surgical intervention. Her uncontrolled diabetes places her at increased risk of perioperative complications. Risks will need to be discussed with patient by attending surgeon should the decision be made to proceed as planned. Patient is aware that any acute changes in clinical condition may necessitate her procedure being postponed and/or cancelled. Patient will meet with anesthesia team (MD and/or CRNA) on the day of her procedure for preoperative evaluation/assessment. Questions regarding anesthetic course will be fielded at that time.   Pre-surgical instructions were reviewed with the patient during her PAT appointment, and questions were fielded to satisfaction by PAT clinical staff. She has been instructed on which medications that she will need to hold prior to surgery, as well as the ones that have been deemed safe/appropriate to take on the day of her procedure. As part of the general education provided by PAT, patient made aware both verbally and in writing, that she would need to abstain from the use of any illegal substances during her perioperative course.  She was advised that failure to follow the provided instructions could necessitate case  cancellation or result in serious perioperative complications up to and  including death. Patient encouraged to contact PAT and/or her surgeon's office to discuss any questions or concerns that may arise prior to surgery; verbalized understanding.   Quentin Mulling, MSN, APRN, FNP-C, CEN [facility]  Perioperative Services Nurse Practitioner Phone: [phone] Fax: [phone] 05/18/23 9:49 AM  NOTE: This note has been prepared using Dragon dictation software. Despite my best ability to proofread, there is always the potential that unintentional transcriptional errors may still occur from this process.

## 2023-05-18 ENCOUNTER — Encounter: Payer: Self-pay | Admitting: Urology

## 2023-05-20 MED ORDER — GENTAMICIN SULFATE 40 MG/ML IJ SOLN
5.0000 mg/kg | INTRAVENOUS | Status: DC
  Filled 2023-05-20: qty 10.75

## 2023-05-20 MED ORDER — LACTATED RINGERS IV SOLN: INTRAVENOUS | Status: DC

## 2023-05-20 MED ORDER — ORAL CARE MOUTH RINSE: 15.0000 mL | Freq: Once | OROMUCOSAL | Status: DC

## 2023-05-20 MED ORDER — VANCOMYCIN HCL IN DEXTROSE 1-5 GM/200ML-% IV SOLN: 1000.0000 mg | INTRAVENOUS | Status: DC

## 2023-05-21 ENCOUNTER — Encounter: Admission: RE | Payer: Self-pay | Source: Home / Self Care

## 2023-05-21 ENCOUNTER — Ambulatory Visit: Admission: RE | Admit: 2023-05-21 | Payer: Medicare Other | Source: Home / Self Care | Admitting: Urology

## 2023-05-21 DIAGNOSIS — N3941 Urge incontinence: Secondary | ICD-10-CM

## 2023-05-21 DIAGNOSIS — T83110A Breakdown (mechanical) of urinary electronic stimulator device, initial encounter: Secondary | ICD-10-CM

## 2023-05-21 HISTORY — DX: Repeated falls: R29.6

## 2023-05-21 HISTORY — DX: Constipation, unspecified: K59.00

## 2023-05-21 HISTORY — DX: Long term (current) use of aspirin: Z79.82

## 2023-05-21 HISTORY — DX: Chronic kidney disease, stage 3 unspecified: N18.30

## 2023-05-21 HISTORY — DX: Nonalcoholic steatohepatitis (NASH): K75.81

## 2023-05-21 HISTORY — DX: Migraine, unspecified, not intractable, without status migrainosus: G43.909

## 2023-05-21 HISTORY — DX: Palpitations: R00.2

## 2023-05-21 HISTORY — DX: Schizoaffective disorder, bipolar type: F25.0

## 2023-05-21 HISTORY — DX: Gastric ulcer, unspecified as acute or chronic, without hemorrhage or perforation: K25.9

## 2023-05-21 HISTORY — DX: Long term (current) use of antithrombotics/antiplatelets: Z79.02

## 2023-05-21 HISTORY — DX: Hepatomegaly, not elsewhere classified: R16.0

## 2023-05-21 HISTORY — DX: Tachycardia, unspecified: R00.0

## 2023-05-21 SURGERY — REMOVAL OF INTERSTIM IMPLANT
Anesthesia: Monitor Anesthesia Care

## 2023-05-21 MED ORDER — PROPOFOL 1000 MG/100ML IV EMUL
INTRAVENOUS | Status: AC
  Filled 2023-05-21: qty 100

## 2023-05-21 MED ORDER — MIDAZOLAM HCL 2 MG/2ML IJ SOLN
INTRAMUSCULAR | Status: AC
  Filled 2023-05-21: qty 2

## 2023-05-21 MED ORDER — FENTANYL CITRATE (PF) 100 MCG/2ML IJ SOLN
INTRAMUSCULAR | Status: AC
  Filled 2023-05-21: qty 2

## 2023-05-21 MED ORDER — PROPOFOL 10 MG/ML IV BOLUS
INTRAVENOUS | Status: AC
  Filled 2023-05-21: qty 20

## 2023-05-21 NOTE — H&P (Signed)
hen I saw the patient in February 2017 the interstim device x-rays looked in good position.  It was interrogated and the impedance check was normal.  She turned the device up she felt it vaginally.  She had good battery life then.  She recently saw Carollee Herter and had a KUB November 27, 2017.  Impedance check was done and bladder was found to be dead.  She has been having some vaginal discomfort apparently with intercourse and has had a previous bladder suspension.   The patient thinks her InterStim was working beautifully until several weeks ago.  She reported being almost completely dry.  Now she has urge incontinence and milder stress incontinence.  She does not wear pads because she says they irritate her.  She changes her close several times.  She ranks the incontinence is milder.  She is now getting up 4 times at night and set up twice.  She can void every 15-20 minutes and cannot hold it for 2 hours.  She feels discomfort or pressure over the IPG.  She is allergic to amoxicillin   December 31, 2017 the patient had the IPG change.  All 4-lead positions looked excellent with good Bellows and toe responses at very low amplitude.   We can see dramatically better.  Improved urge incontinence.  She still has mild stress incontinence and I educated her regarding this.  Incisions look normal.  Some basic teaching was provided by our nurses.     Patient had called in leaking and was trouble shot by Maralyn Sago on Jan 21, 2018. e By history I believe that this did help significantly but in the last 6 weeks she started to leak more again.  She can leak sitting not associated with awareness.  She has moderate bedwetting.  She has urge incontinence.  She can soak a pad when she goes out in public.  At home she changes her close.  She does not have cystitis symptoms but urine sent for culture.  She does not change the settings whatsoever   She did not feel it rectally and vaginally at 1.1 but did at 1.5 and was left at that  amplitude.  Again there was a lot of time needed to connect to the device for unknown reasons.  This happened last time as well.  Maralyn Sago will see her in 2 weeks and make further adjustments.  We do not think the patient is able to do this on her own.  She was told to improve her diet since she had glucose in the urine.  Urine cultures positive     Today I have not seen the patient since August 2019.  The patient said 2 or 3 months ago she was wearing 1 pad a day especially when she went out in public.  Now she wears 3 pads a day that are quite wet.  She leaks with coughing sneezing and with urgency.  She can leak without awareness.  She has bedwetting.  She will wake up and also leak before she gets to the bedside commode  She is voiding every 2-3 hours gets up 3 times a night.  When she turns up the amplitude she feels it vaginally.  She does not know how to change programs  Clinically not infected     due to staffing issues we will need to have the [employer] rep come in and troubleshoot the device. We will proceed accordingly. Call if urine culture is positive    Today Patient reports in  the last 1 year she has been having urge incontinence soaking 3 pads a day.  She has bedwetting.  She is a bedside commode.  She voids every 1 hour and cannot hold it for 2 hours.  She gets up twice a night.  She said she was hospitalized once with sepsis.  1 out of the last for 5 urine cultures in the healthcare system record was positive and the rest were negative.  She is not on any medication for overactive bladder.  She had a new battery in 2019.  The device was placed in 2017 earlier.  No cystitis symptoms.  When her and her husband change programs or increased amplitude she does not feel anything   Reviewed a note from 2017.  She had actually had InterStim placed in 2011 by Dr. Barnabas Lister.  She was having urge incontinence and bedwetting wearing 4-5 pads a day.  She also had some stress incontinence.  When I  saw her back in 2017 the battery had expired.       PMH:     Past Medical History:  Diagnosis Date   Anxiety     Arthritis     Asthma     Bipolar depression (HCC)     CHF (congestive heart failure) (HCC)     Chronic headaches      migraines have lessened   Chronic kidney disease      ddstage 111   COPD (chronic obstructive pulmonary disease) (HCC)     Coronary artery disease     Depression     Diabetic neuropathy (HCC)     Dyspnea      uses inhalers for this   Dysrhythmia      rapid   Fatty liver     GERD (gastroesophageal reflux disease)     Headache     Heart attack (HCC) 2009   Hyperlipidemia     Hypertension     Hypomagnesemia     Hypothyroid     Irregular heart rhythm     Liver lesion      Favored to be benign on CT; MRI of abd w/contract in Aug 2016. Liver Biopsy-Negative, March 2016   Morbid obesity (HCC)     OSA (obstructive sleep apnea)     Peripheral vascular disease (HCC)     Pinched nerve      Back   Pneumonia     RLS (restless legs syndrome)     Seasonal allergies     Sleep apnea      uses oxygen at night 2L d/t cpap did not fit   Type 2 diabetes, uncontrolled, with neuropathy 10/15/2015    Overview:  Microalbumin 7.7 12/2010: Hgb A1c 10.2% 12/2010 and 9.7% 03/2011, eye exam 10/2009 [facility]    Urinary incontinence     Vitamin D deficiency            Surgical History:      Past Surgical History:  Procedure Laterality Date   ABDOMINAL HYSTERECTOMY        Partial   bladder stim   2007   BLADDER SURGERY        x 2. tacking of bladder and the other was the stimulator placement   CHOLECYSTECTOMY       COLONOSCOPY WITH PROPOFOL N/A 03/04/2019    Procedure: COLONOSCOPY WITH PROPOFOL;  Surgeon: Midge Minium, MD;  Location: [facility] ENDOSCOPY;  Service: Endoscopy;  Laterality: N/A;   ESOPHAGOGASTRODUODENOSCOPY (EGD) WITH PROPOFOL N/A 03/04/2019  Procedure: ESOPHAGOGASTRODUODENOSCOPY (EGD) WITH PROPOFOL;  Surgeon: Midge Minium, MD;  Location: [facility]  ENDOSCOPY;  Service: Endoscopy;  Laterality: N/A;   INCISION AND DRAINAGE ABSCESS Right 04/16/2019    Procedure: INCISION AND DRAINAGE ABSCESS;  Surgeon: Sung Amabile, DO;  Location: [facility] ORS;  Service: General;  Laterality: Right;   INTERSTIM-GENERATOR CHANGE N/A 12/31/2017    Procedure: INTERSTIM-GENERATOR CHANGE;  Surgeon: Alfredo Martinez, MD;  Location: [facility] ORS;  Service: Urology;  Laterality: N/A;   LIVER BIOPSY   March 2016    Negative   LUNG BIOPSY   2015    small spot on lung   TUBAL LIGATION       UPPER GI ENDOSCOPY       WRIST SURGERY Right 11/02/2019          Home Medications:  Allergies as of 03/05/2023         Reactions    Mirtazapine Anaphylaxis    REACTION: closes throat    Morphine And Codeine      anaphylaxis    Sulfamethoxazole-trimethoprim Rash    REACTION: Whelps all over    Amoxicillin Er Nausea And Vomiting    Pt doesn't remember problem with amoxicillin -  SE Has patient had a PCN reaction causing immediate rash, facial/tongue/throat swelling, SOB or lightheadedness with hypotension: No Has patient had a PCN reaction causing severe rash involving mucus membranes or skin necrosis: No Has patient had a PCN reaction that required hospitalization: No Has patient had a PCN reaction occurring within the last 10 years: no If all of the above answers are "NO", then may proceed with Cephalosporin use.    Codeine Itching    REACTION: itch, rash    Hydrocodone-acetaminophen Rash    REACTION: rash    Keflex [cephalexin] Rash    Pt does not remember having a reaction or rash, no airway involvement    Prasterone Nausea And Vomiting            Medication List           Accurate as of March 05, 2023  8:58 AM. If you have any questions, ask your nurse or doctor.              atorvastatin 80 MG tablet Commonly known as: LIPITOR Take 80 mg by mouth at bedtime.    busPIRone 30 MG tablet Commonly known as: BUSPAR Take 30 mg by mouth 2 (two) times daily.     citalopram 20 MG tablet Commonly known as: CELEXA Take 20 mg by mouth daily.    esomeprazole 40 MG capsule Commonly known as: NEXIUM TAKE 1 CAPSULE BY MOUTH IN THE MORNING    ezetimibe 10 MG tablet Commonly known as: ZETIA Take 10 mg by mouth at bedtime.    fenofibrate micronized 134 MG capsule Commonly known as: LOFIBRA Take 134 mg by mouth at bedtime.    fluticasone 50 MCG/ACT nasal spray Commonly known as: FLONASE PLACE 2 SPRAYS INTO BOTH NOSTRILS DAILY    gabapentin 300 MG capsule Commonly known as: NEURONTIN Take 300 mg by mouth 2 (two) times daily.    gabapentin 600 MG tablet Commonly known as: NEURONTIN Take 600 mg by mouth at bedtime.    Glyxambi 25-5 MG Tabs Generic drug: Empagliflozin-linaGLIPtin Take 1 tablet by mouth daily.    HumaLOG KwikPen 100 UNIT/ML KwikPen Generic drug: insulin lispro Inject into the skin.    HumuLIN R U-500 KwikPen 500 UNIT/ML KwikPen Generic drug: insulin regular human CONCENTRATED Inject 70 Units into  the skin 3 (three) times daily with meals.    hydrOXYzine 50 MG tablet Commonly known as: ATARAX Take 50 mg by mouth 3 (three) times daily.    Insulin Aspart FlexPen 100 UNIT/ML Commonly known as: NOVOLOG Inject into the skin.    lamoTRIgine 200 MG tablet Commonly known as: LAMICTAL Take 200 mg by mouth 2 (two) times daily.    Linzess 145 MCG Caps capsule Generic drug: linaclotide TAKE 1 CAPSULE BY MOUTH DAILY BEFORE BREAKFAST What changed: See the new instructions.    meclizine 25 MG tablet Commonly known as: ANTIVERT TAKE 1 TABLET BY MOUTH 3 TIMES DAILY AS NEEDED FOR DIZZINESS OR NAUSEA    metoprolol tartrate 25 MG tablet Commonly known as: LOPRESSOR Take 1 tablet (25 mg total) by mouth 2 (two) times daily. NEED OV.    nitroGLYCERIN 0.4 MG SL tablet Commonly known as: NITROSTAT Place 1 tablet (0.4 mg total) under the tongue every 5 (five) minutes as needed for chest pain.    ondansetron 4 MG disintegrating  tablet Commonly known as: ZOFRAN-ODT Take 1 tablet (4 mg total) by mouth every 8 (eight) hours as needed.    OneTouch Delica Lancets 30G Misc by Does not apply route.    Pen Needles 31G X 8 MM Misc    promethazine 25 MG suppository Commonly known as: PHENERGAN Place 1 suppository (25 mg total) rectally every 6 (six) hours as needed for nausea or vomiting.    QUEtiapine 300 MG tablet Commonly known as: SEROQUEL Take 300 mg by mouth at bedtime. Along with 200mg  dose for a total of 500mg     SUMAtriptan 100 MG tablet Commonly known as: IMITREX TAKE 1 TABLET AT ONSET OF MIGRAINE. MAY REPEAT IN 2 HOURS IF HEADACHE PERSISTS OR RECURS.    Synthroid 100 MCG tablet Generic drug: levothyroxine Take 100 mcg by mouth daily.    Trulicity 1.5 MG/0.5ML Sopn Generic drug: Dulaglutide Inject 1.5 mg into the skin once a week. (Wednesdays)    Ubrelvy 100 MG Tabs Generic drug: Ubrogepant Take 1 tablet by mouth every other day.             Allergies:  Allergies       Allergies  Allergen Reactions   Mirtazapine Anaphylaxis      REACTION: closes throat   Morphine And Codeine        anaphylaxis   Sulfamethoxazole-Trimethoprim Rash      REACTION: Whelps all over   Amoxicillin Er Nausea And Vomiting      Pt doesn't remember problem with amoxicillin -  SE Has patient had a PCN reaction causing immediate rash, facial/tongue/throat swelling, SOB or lightheadedness with hypotension: No Has patient had a PCN reaction causing severe rash involving mucus membranes or skin necrosis: No Has patient had a PCN reaction that required hospitalization: No Has patient had a PCN reaction occurring within the last 10 years: no If all of the above answers are "NO", then may proceed with Cephalosporin use.   Codeine Itching      REACTION: itch, rash   Hydrocodone-Acetaminophen Rash      REACTION: rash   Keflex [Cephalexin] Rash      Pt does not remember having a reaction or rash, no airway involvement    Prasterone Nausea And Vomiting        Family History:      Family History  Problem Relation Age of Onset   Diabetes Mother     Heart disease Mother  Hyperlipidemia Mother     Hypertension Mother     Kidney disease Mother     Mental illness Mother     Hypothyroidism Mother     Stroke Mother     Osteoporosis Mother     Glaucoma Mother     Congestive Heart Failure Mother     Hypertension Father     Kidney disease Brother     Intracerebral hemorrhage Brother     Breast cancer Maternal Grandmother 60   Heart disease Maternal Grandfather     Rheum arthritis Maternal Grandfather     Cancer Maternal Grandfather          liver   Kidney disease Maternal Grandfather     Cancer Paternal Grandmother          lung   Asthma Daughter     Cancer Daughter          throat   Bipolar disorder Daughter     Bipolar disorder Daughter     Hypertension Daughter     Restless legs syndrome Daughter            Social History:  reports that she has never smoked. She has never used smokeless tobacco. She reports that she does not drink alcohol and does not use drugs.   ROS:                           Physical Exam: BP 139/80   Ht 4\' 11"  (1.499 m)   Wt 87.5 kg   BMI 38.98 kg/m   Constitutional:  Alert and oriented, No acute distress. HEENT: [place] AT, moist mucus membranes.  Trachea midline, no masses. Cardiovascular: No clubbing, cyanosis, or edema.     Laboratory Data: Recent Labs       Lab Results  Component Value Date    WBC 7.0 02/21/2023    HGB 13.0 02/21/2023    HCT 41.4 02/21/2023    MCV 92.2 02/21/2023    PLT 215 02/21/2023        Recent Labs       Lab Results  Component Value Date    CREATININE 1.27 (H) 02/21/2023        Recent Labs  No results found for: "PSA"     Recent Labs  No results found for: "TESTOSTERONE"     Recent Labs       Lab Results  Component Value Date    HGBA1C 8.2 10/24/2022        Urinalysis Labs (Brief)           Component Value Date/Time    COLORURINE YELLOW (A) 02/21/2023 1829    APPEARANCEUR CLEAR (A) 02/21/2023 1829    APPEARANCEUR Hazy (A) 09/19/2021 0937    LABSPEC 1.030 02/21/2023 1829    LABSPEC 1.026 10/09/2014 1808    PHURINE 5.0 02/21/2023 1829    GLUCOSEU >=500 (A) 02/21/2023 1829    GLUCOSEU >=500 10/09/2014 1808    HGBUR NEGATIVE 02/21/2023 1829    HGBUR moderate 01/04/2009 0913    BILIRUBINUR NEGATIVE 02/21/2023 1829    BILIRUBINUR negative 11/17/2022 0955    BILIRUBINUR Negative 09/19/2021 0937    BILIRUBINUR Negative 10/09/2014 1808    KETONESUR NEGATIVE 02/21/2023 1829    PROTEINUR NEGATIVE 02/21/2023 1829    UROBILINOGEN 0.2 11/17/2022 0955    UROBILINOGEN 0.2 01/04/2009 0913    NITRITE NEGATIVE 02/21/2023 1829    LEUKOCYTESUR NEGATIVE 02/21/2023 1829    LEUKOCYTESUR Negative  10/09/2014 1808        Pertinent Imaging: Urine reviewed.  Urine sent for culture.  Chart reviewed   Assessment & Plan: It appears that the lead is approximately 59 years old and the IPG was replaced in 2019.  I will get a lateral and AP view of the sacrum and have her see our nurse practitioner for troubleshooting and assessment.  I will give her Gemtesa samples in the interim since she is never tried this medication.  Call if culture positive.  It would likely be in the best interest of the patient that if we replaced the IPG battery that the lead is also replaced.  I did review this in full detail today but she still needs to be assessed.  Sequelae of retained tip discussed.  Increased risk of infection discussed.  Unsatisfactory results discussed.   Again the lead is 59 years old and she agreed that we will go ahead and replace the entire device.  I have counseled her for surgery but I want the above steps performed and we will proceed accordingly   Patient is on blood thinners and we need to have clearance to discontinue them

## 2023-05-23 ENCOUNTER — Other Ambulatory Visit: Payer: Self-pay | Admitting: Family Medicine

## 2023-05-23 DIAGNOSIS — G43009 Migraine without aura, not intractable, without status migrainosus: Secondary | ICD-10-CM

## 2023-06-04 ENCOUNTER — Encounter: Payer: Medicare Other | Admitting: Urology

## 2023-06-04 IMAGING — CT CT HEAD CODE STROKE
4 series · 16 of 47 positions shown, 18 images · non-contrast
Comparison: 10/04/2021

CLINICAL DATA: Code stroke.  Neuro deficit, acute, stroke suspected



[Series 4: head bone · axial · 0.39mm/px · z∈[-133,-105]mm · 3 of 73 slices shown]
[im 8/73  bone]
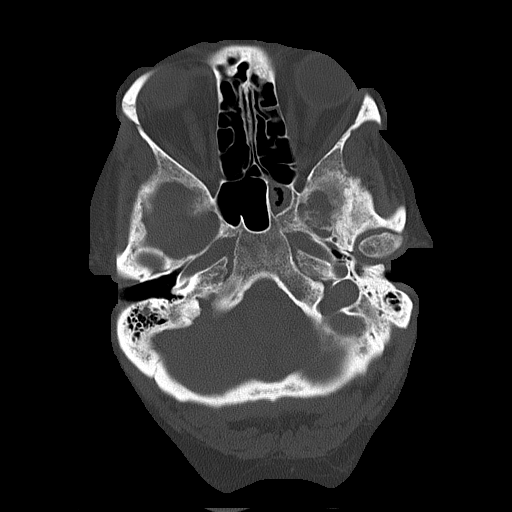
[im 15/73  bone]
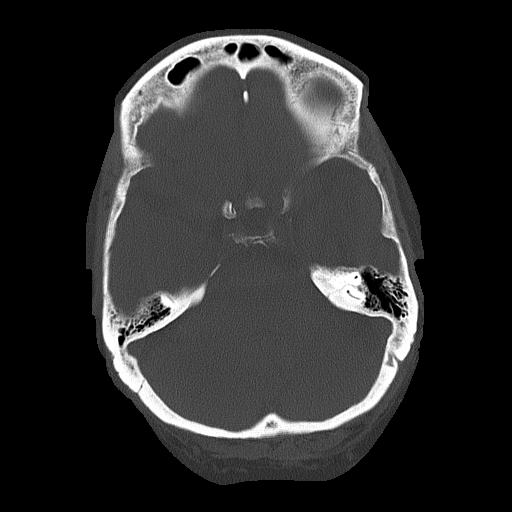
[im 22/73  bone]
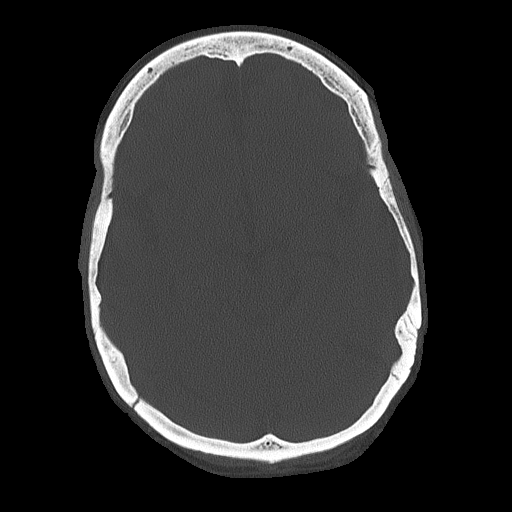

[Series 5: head wo · axial · 0.39mm/px · z∈[-132,-22]mm · 7 of 30 slices shown, 9 images]
[im 4/30  brain]
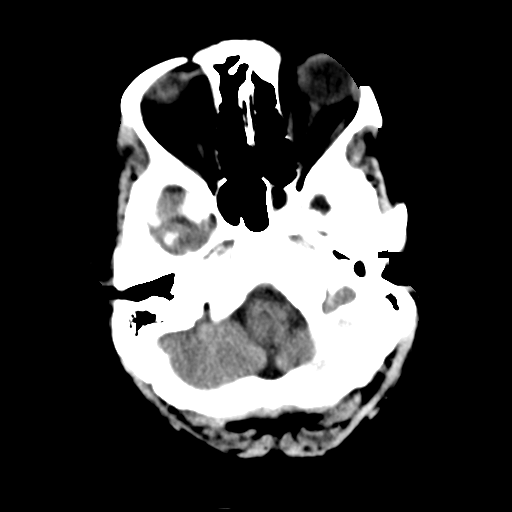
[im 4/30  bone]
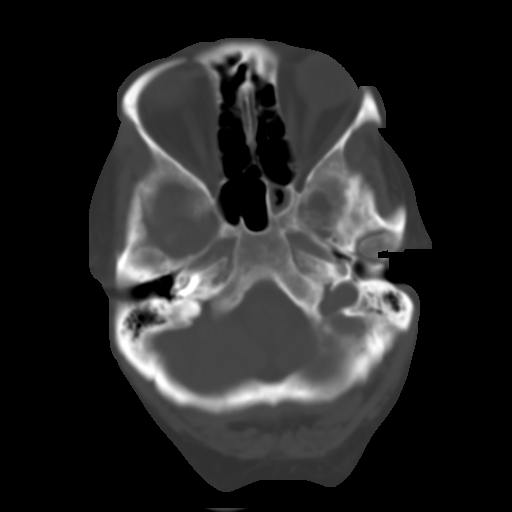
[im 8/30  brain]
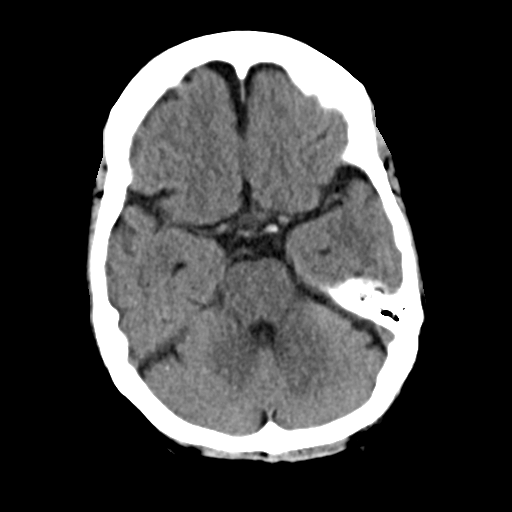
[im 11/30  brain]
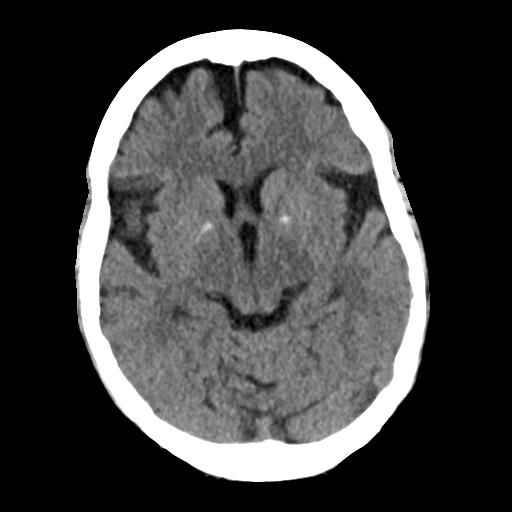
[im 15/30  brain]
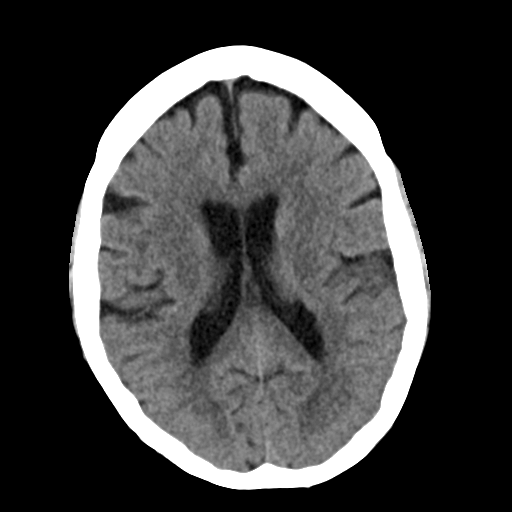
[im 19/30  brain]
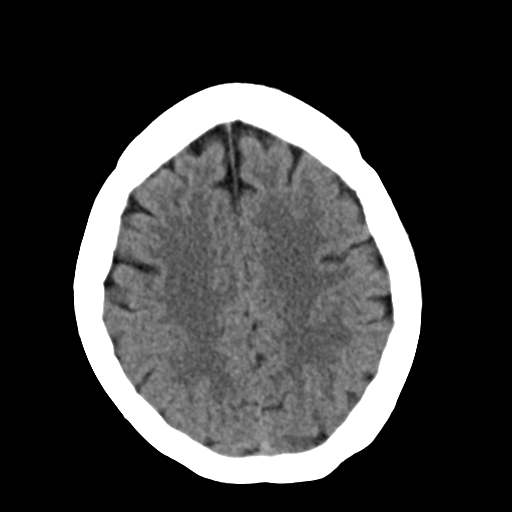
[im 19/30  bone]
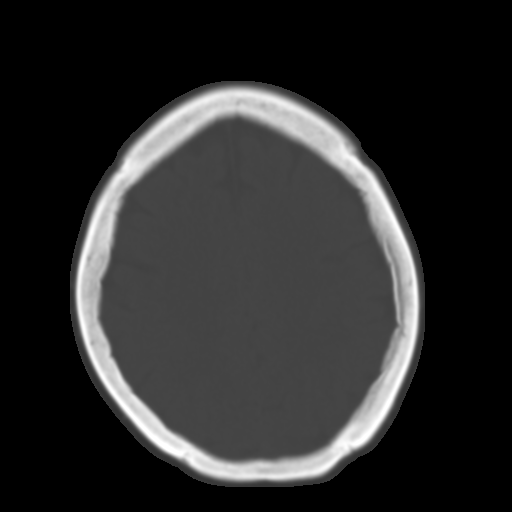
[im 22/30  brain]
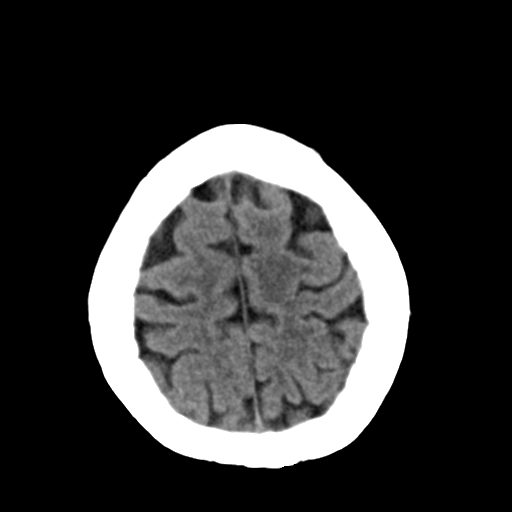
[im 26/30  brain]
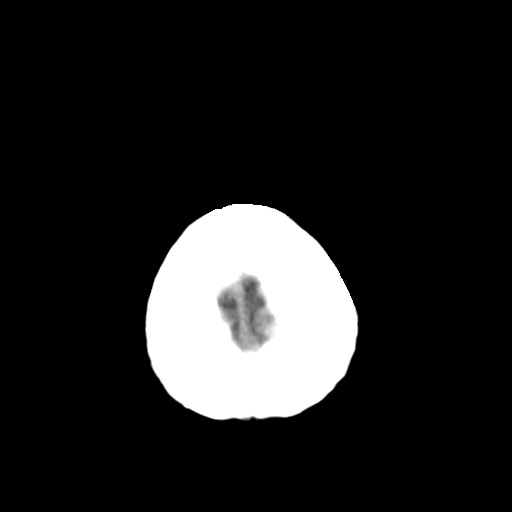

[Series 6: coronal soft tissue · coronal · 0.29mm/px · 3 of 63 slices shown]
[im 21/63  brain]
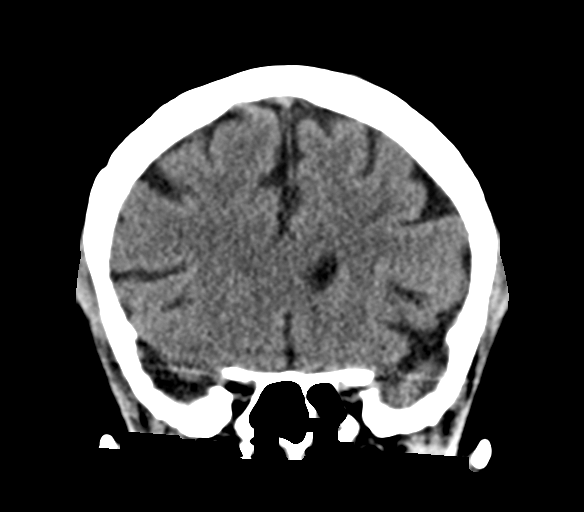
[im 28/63  brain]
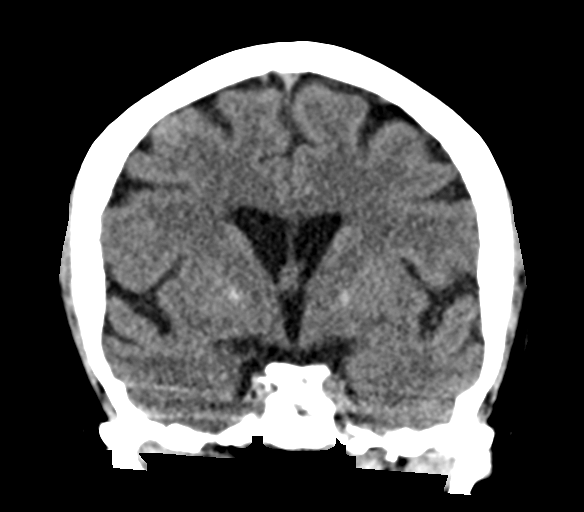
[im 35/63  brain]
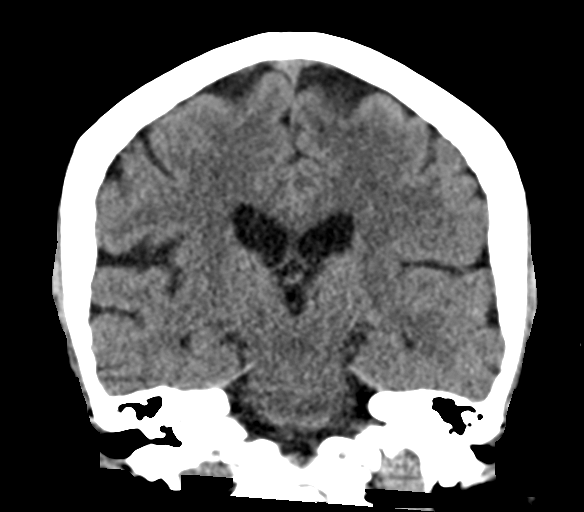

[Series 7: sagittal soft tissue · sagittal · 0.31mm/px · 3 of 58 slices shown]
[im 20/58  brain]
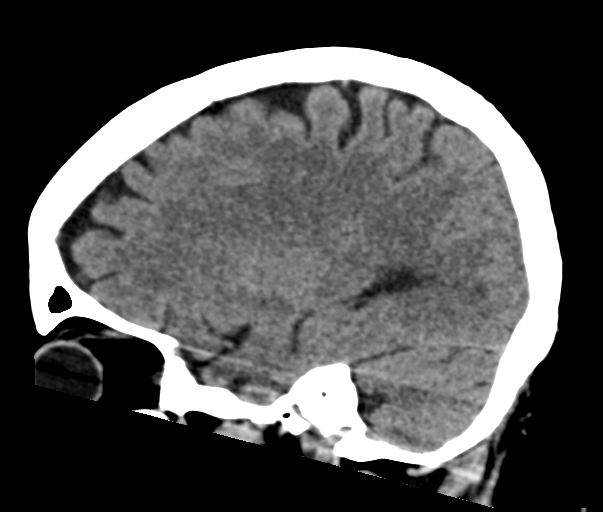
[im 29/58  brain]
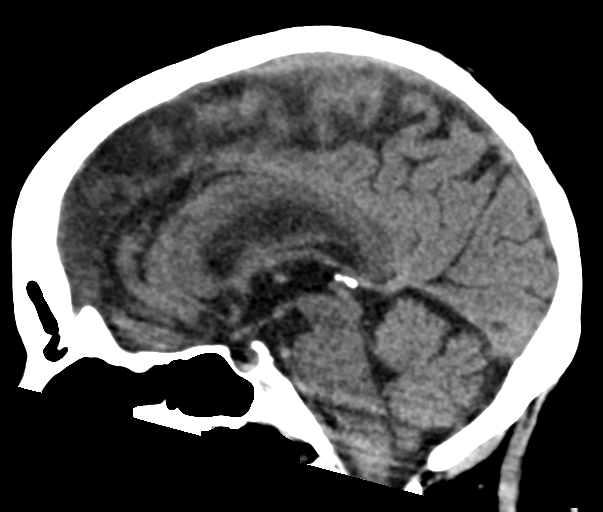
[im 39/58  brain]
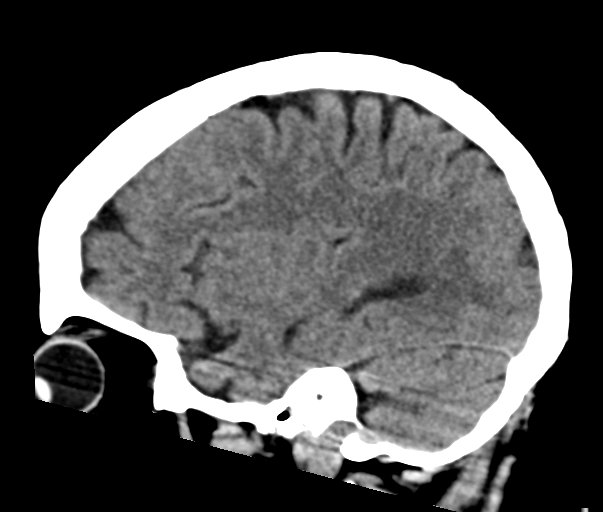

[16 of 47 positions shown; findings below may reference images not displayed]

FINDINGS: Brain: No acute intracranial hemorrhage, mass effect, edema. No new
loss of gray-white differentiation. Ventricles and sulci are stable
in size and configuration. Patchy low-density in the supratentorial
white matter is nonspecific but may reflect similar minor chronic
microvascular ischemic changes. No extra-axial collection.

Vascular: No hyperdense vessel.

Skull: Unremarkable.

Sinuses/Orbits: No acute abnormality.

Other: Mastoid air cells are clear.

ASPECTS ([place]ber[place] Stroke Program Early CT Score)

- Ganglionic level infarction (caudate, lentiform nuclei, internal
capsule, insula, M1-M3 cortex): 7

- Supraganglionic infarction (M4-M6 cortex): 3

Total score (0-10 with 10 being normal): 10
IMPRESSION: There is no acute intracranial hemorrhage or evidence of acute
infarction. ASPECT score is 10.

These results were communicated to [REDACTED] at [DATE] on
10/31/2021 by text page via the [employer] messaging system.

## 2023-06-15 DIAGNOSIS — E119 Type 2 diabetes mellitus without complications: Secondary | ICD-10-CM | POA: Diagnosis not present

## 2023-06-15 DIAGNOSIS — E039 Hypothyroidism, unspecified: Secondary | ICD-10-CM | POA: Diagnosis not present

## 2023-06-18 ENCOUNTER — Other Ambulatory Visit: Payer: Self-pay | Admitting: Family Medicine

## 2023-06-18 DIAGNOSIS — E66812 Obesity, class 2: Secondary | ICD-10-CM | POA: Diagnosis not present

## 2023-06-18 DIAGNOSIS — N1831 Chronic kidney disease, stage 3a: Secondary | ICD-10-CM | POA: Diagnosis not present

## 2023-06-18 DIAGNOSIS — E1165 Type 2 diabetes mellitus with hyperglycemia: Secondary | ICD-10-CM | POA: Diagnosis not present

## 2023-06-18 DIAGNOSIS — E039 Hypothyroidism, unspecified: Secondary | ICD-10-CM | POA: Diagnosis not present

## 2023-06-18 DIAGNOSIS — Z794 Long term (current) use of insulin: Secondary | ICD-10-CM | POA: Insufficient documentation

## 2023-06-18 MED ORDER — SYNTHROID 88 MCG PO TABS: 88.0000 ug | ORAL_TABLET | Freq: Every day | ORAL | 0 refills | Status: DC

## 2023-06-19 LAB — FRUCTOSAMINE: Fructosamine: 226 umol/L (ref 205–285)

## 2023-06-19 LAB — TSH: TSH: 0.07 m[IU]/L — ABNORMAL LOW (ref 0.40–4.50)

## 2023-06-19 NOTE — Progress Notes (Unsigned)
Name: [name]   MRN: [id number]    DOB: 26-Jul-1966   Date:06/20/2023       Progress Note  Subjective  Chief Complaint  Follow up  HPI  She  came in today for pre op clearance for Urological surgery - removal of Interstim Implant , initially scheduled for 05/21/2023 , however her A1C at the time was over 10.4 % and due to risk of infection we advised her to postpone procedure until glucose under better control. she has been compliant with medications for DM, she has changed her diet and is losing weight. She established care with a new Endo last week and had some medication adjustments .  She had a  fructosamine level last week was within normal limits. We will clear her for surgery today   DM I insulin Dependent: her A1C was 10.4 % Sept 2024, she has changed her diet, been compliant with medications, seen by Endo and fsbs at home has been between high 70's-120 fasting. She has lost 8 lbs in the past month do to life style modification   Hypothyroidism: TSH suppressed and Endo advised last week to stop taking levothyroxine.   Patient Active Problem List   Diagnosis Date Noted   Acquired hypothyroidism 06/20/2023   Class 2 severe obesity with serious comorbidity and body mass index (BMI) of 35.0 to 35.9 in adult (HCC) 06/19/2023   Type 2 diabetes mellitus with hyperglycemia, with long-term current use of insulin (HCC) 06/18/2023   Insulin dependent type 2 diabetes mellitus (HCC) 03/19/2023   Bacteremia due to Escherichia coli 09/13/2022   Encephalopathy acute 09/13/2022   Severe sepsis (HCC) 09/10/2022   Vertigo 02/10/2022   Abnormal urinalysis 02/10/2022   TIA (transient ischemic attack) 10/04/2021   HTN (hypertension) 10/04/2021   Chronic kidney disease, stage 3a (HCC) 10/04/2021   Depression with anxiety 10/04/2021   COPD (chronic obstructive pulmonary disease) (HCC) 10/04/2021   CAD (coronary artery disease) 10/04/2021   Chronic diastolic CHF (congestive heart failure) (HCC)  10/04/2021   Type II diabetes mellitus with renal manifestations (HCC) 06/30/2021   Interstitial cystitis 06/30/2021   Atherosclerosis of aorta (HCC) 03/28/2021   Iron deficiency anemia due to chronic blood loss    Polyp of ascending colon    Vitamin B12 deficiency 08/08/2018   Patient is full code 10/30/2017   Coronary artery disease of native artery of native heart with stable angina pectoris (HCC) 10/09/2017   Plantar fascial fibromatosis 10/09/2017   Tendinitis of wrist 10/09/2017   Schizoaffective disorder, bipolar type (HCC) 09/28/2017   Adenomatous colon polyp 10/15/2015   Bipolar affective disorder (HCC) 10/15/2015   Constipation 10/15/2015   HLD (hyperlipidemia) 10/15/2015   NASH (nonalcoholic steatohepatitis) 10/15/2015   Obesity (BMI 35.0-39.9 without comorbidity) 10/15/2015   Restless leg syndrome 10/15/2015   Low back pain with left-sided sciatica 08/18/2015   Apnea, sleep 05/05/2015   Heart valve disease 05/05/2015   Complicated UTI (urinary tract infection) 03/24/2015   CKD stage 3 due to type 2 diabetes mellitus (HCC) 03/15/2015   Migraine 03/12/2015   Chronic bronchitis (HCC) 01/20/2015   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HCC) 01/20/2015   GERD (gastroesophageal reflux disease) 01/20/2015   Hypothyroid    Vitamin D deficiency    Urinary incontinence    Liver lesion    Hypomagnesemia    Obstructive sleep apnea 10/20/2013   Hereditary and idiopathic peripheral neuropathy 03/04/2009   Hemorrhoids, external 09/03/2008   Allergic rhinitis 07/04/2007   Interstitial cystitis 07/04/2007  Past Surgical History:  Procedure Laterality Date   ABDOMINAL HYSTERECTOMY     Partial   bladder stim  2007   BLADDER SURGERY     x 2. tacking of bladder and the other was the stimulator placement   CHOLECYSTECTOMY     COLONOSCOPY WITH PROPOFOL N/A 03/04/2019   Procedure: COLONOSCOPY WITH PROPOFOL;  Surgeon: Midge Minium, MD;  Location: [facility] ENDOSCOPY;  Service: Endoscopy;   Laterality: N/A;   ESOPHAGOGASTRODUODENOSCOPY (EGD) WITH PROPOFOL N/A 03/04/2019   Procedure: ESOPHAGOGASTRODUODENOSCOPY (EGD) WITH PROPOFOL;  Surgeon: Midge Minium, MD;  Location: [facility] ENDOSCOPY;  Service: Endoscopy;  Laterality: N/A;   INCISION AND DRAINAGE ABSCESS Right 04/16/2019   Procedure: INCISION AND DRAINAGE ABSCESS;  Surgeon: Sung Amabile, DO;  Location: [facility] ORS;  Service: General;  Laterality: Right;   INTERSTIM-GENERATOR CHANGE N/A 12/31/2017   Procedure: INTERSTIM-GENERATOR CHANGE;  Surgeon: Alfredo Martinez, MD;  Location: [facility] ORS;  Service: Urology;  Laterality: N/A;   LEFT HEART CATH AND CORONARY ANGIOGRAPHY Left 04/13/2011   Procedure: LEFT HEART CATH AND CORONARY ANGIOGRAPHY; Location: [facility]; Surgeon: Arnoldo Hooker, MD   LIVER BIOPSY  11/2014   Negative   LUNG BIOPSY  2015   small spot on lung   TUBAL LIGATION     UPPER GI ENDOSCOPY     WRIST SURGERY Right 11/02/2019    Family History  Problem Relation Age of Onset   Diabetes Mother    Heart disease Mother    Hyperlipidemia Mother    Hypertension Mother    Kidney disease Mother    Mental illness Mother    Hypothyroidism Mother    Stroke Mother    Osteoporosis Mother    Glaucoma Mother    Congestive Heart Failure Mother    Hypertension Father    Kidney disease Brother    Intracerebral hemorrhage Brother    Breast cancer Maternal Grandmother 60   Heart disease Maternal Grandfather    Rheum arthritis Maternal Grandfather    Cancer Maternal Grandfather        liver   Kidney disease Maternal Grandfather    Cancer Paternal Grandmother        lung   Asthma Daughter    Cancer Daughter        throat   Bipolar disorder Daughter    Bipolar disorder Daughter    Hypertension Daughter    Restless legs syndrome Daughter     Social History   Tobacco Use   Smoking status: Never   Smokeless tobacco: Never  Substance Use Topics   Alcohol use: No     Current Outpatient Medications:    aspirin EC 81 MG  tablet, Take 81 mg by mouth daily. Swallow whole., Disp: , Rfl:    atorvastatin (LIPITOR) 80 MG tablet, Take 1 tablet (80 mg total) by mouth at bedtime., Disp: 90 tablet, Rfl: 1   busPIRone (BUSPAR) 30 MG tablet, Take 30 mg by mouth 2 (two) times daily., Disp: , Rfl:    cetirizine (ZYRTEC) 10 MG tablet, Take 1 tablet (10 mg total) by mouth daily. (Patient taking differently: Take 10 mg by mouth at bedtime.), Disp: 30 tablet, Rfl: 0   citalopram (CELEXA) 20 MG tablet, Take 20 mg by mouth every morning., Disp: , Rfl:    clopidogrel (PLAVIX) 75 MG tablet, Take 75 mg by mouth every morning., Disp: , Rfl:    empagliflozin (JARDIANCE) 25 MG TABS tablet, Take 1 tablet by mouth daily., Disp: , Rfl:    esomeprazole (NEXIUM) 40 MG capsule,  Take 1 capsule (40 mg total) by mouth every morning. MUST SCHEDULE OFFICE VISIT, Disp: 30 capsule, Rfl: 0   ezetimibe (ZETIA) 10 MG tablet, Take 1 tablet (10 mg total) by mouth at bedtime., Disp: 90 tablet, Rfl: 1   fenofibrate micronized (LOFIBRA) 134 MG capsule, Take 1 capsule (134 mg total) by mouth at bedtime., Disp: 90 capsule, Rfl: 1   fluticasone (FLONASE) 50 MCG/ACT nasal spray, Place 2 sprays into both nostrils daily., Disp: 48 g, Rfl: 0   gabapentin (NEURONTIN) 400 MG capsule, Take 400 mg by mouth 2 (two) times daily., Disp: , Rfl:    GVOKE HYPOPEN 2-PACK 1 MG/0.2ML SOAJ, Inject 1 mg into the skin as needed (for hypoglycemia episodes (glucose below 60 and symptomatic))., Disp: 0.4 mL, Rfl: 1   HUMALOG KWIKPEN 100 UNIT/ML KwikPen, Inject into the skin as needed. This is a SS-pt unsure of dosages of SS, Disp: , Rfl:    hydrOXYzine (ATARAX) 50 MG tablet, Take 50 mg by mouth 3 (three) times daily., Disp: , Rfl:    Insulin Pen Needle (PEN NEEDLES) 31G X 8 MM MISC, , Disp: , Rfl:    lamoTRIgine (LAMICTAL) 200 MG tablet, Take 200 mg by mouth 2 (two) times daily. , Disp: , Rfl:    meclizine (ANTIVERT) 50 MG tablet, Take 1 tablet (50 mg total) by mouth 3 (three) times  daily as needed., Disp: 30 tablet, Rfl: 2   metoprolol tartrate (LOPRESSOR) 25 MG tablet, Take 1 tablet (25 mg total) by mouth 2 (two) times daily., Disp: 90 tablet, Rfl: 3   Multiple Vitamin (MULTI-VITAMIN) tablet, Take 1 tablet by mouth daily., Disp: , Rfl:    nitroGLYCERIN (NITROSTAT) 0.4 MG SL tablet, Place 1 tablet (0.4 mg total) under the tongue every 5 (five) minutes as needed for chest pain., Disp: 25 tablet, Rfl: 3   ondansetron (ZOFRAN-ODT) 4 MG disintegrating tablet, Take 1 tablet (4 mg total) by mouth every 8 (eight) hours as needed., Disp: 20 tablet, Rfl: 0   OneTouch Delica Lancets 30G MISC, by Does not apply route., Disp: , Rfl:    QUEtiapine (SEROQUEL) 300 MG tablet, Take 300 mg by mouth at bedtime. Along with 200mg  dose for a total of 500mg , Disp: , Rfl:    Rimegepant Sulfate (NURTEC) 75 MG TBDP, Take 1 tablet (75 mg total) by mouth every other day., Disp: 30 tablet, Rfl: 2   SUMAtriptan (IMITREX) 100 MG tablet, Take 1 tablet (100 mg total) by mouth every 2 (two) hours as needed. TAKE 1 TABLET AT ONSET OF MIGRAINE. MAY REPEAT IN 2 HOURS IF HEADACHE PERSISTS OR RECURS., Disp: 10 tablet, Rfl: 0   SYNTHROID 88 MCG tablet, Take 1 tablet (88 mcg total) by mouth daily before breakfast., Disp: 90 tablet, Rfl: 0   TRULICITY 1.5 MG/0.5ML SOPN, Inject 1.5 mg into the skin once a week. (Wednesdays), Disp: , Rfl:    Vibegron (GEMTESA) 75 MG TABS, Take 1 tablet (75 mg total) by mouth daily. (Patient taking differently: Take 75 mg by mouth every morning.), Disp: 30 tablet, Rfl: 0   HUMULIN R U-500 KWIKPEN 500 UNIT/ML kwikpen, Inject 70 Units into the skin 3 (three) times daily with meals. (Patient not taking: Reported on 06/20/2023), Disp: , Rfl:    linaclotide (LINZESS) 145 MCG CAPS capsule, TAKE 1 CAPSULE BY MOUTH DAILY BEFORE BREAKFAST (Patient not taking: Reported on 06/20/2023), Disp: 30 capsule, Rfl: 3  Allergies  Allergen Reactions   Mirtazapine Anaphylaxis    REACTION: closes throat  Morphine And Codeine     anaphylaxis   Sulfamethoxazole-Trimethoprim Rash    REACTION: Whelps all over   Amoxicillin Er Nausea And Vomiting    Pt doesn't remember problem with amoxicillin -  SE Has patient had a PCN reaction causing immediate rash, facial/tongue/throat swelling, SOB or lightheadedness with hypotension: No Has patient had a PCN reaction causing severe rash involving mucus membranes or skin necrosis: No Has patient had a PCN reaction that required hospitalization: No Has patient had a PCN reaction occurring within the last 10 years: no If all of the above answers are "NO", then may proceed with Cephalosporin use.   Codeine Itching    REACTION: itch, rash   Hydrocodone-Acetaminophen Rash    REACTION: rash   Keflex [Cephalexin] Rash    Pt does not remember having a reaction or rash, no airway involvement   Prasterone Nausea And Vomiting    I personally reviewed active problem list, medication list, allergies, family history with the patient/caregiver today.   ROS  Ten systems reviewed and is negative except as mentioned in HPI    Objective  Vitals:   06/20/23 1228  BP: 126/74  Pulse: 86  Resp: 14  Temp: 97.8 F (36.6 C)  TempSrc: Oral  SpO2: 96%  Weight: 178 lb 3.2 oz (80.8 kg)  Height: 5' (1.524 m)    Body mass index is 34.8 kg/m.  Physical Exam  Constitutional: Patient appears well-developed and well-nourished. Obese  No distress.  HEENT: head atraumatic, normocephalic, pupils equal and reactive to light, neck supple Cardiovascular: Normal rate, regular rhythm and normal heart sounds.  No murmur heard. No BLE edema. Pulmonary/Chest: Effort normal and breath sounds normal. No respiratory distress. Abdominal: Soft.  There is no tenderness. Psychiatric: Patient has a normal mood and affect. behavior is normal. Judgment and thought content normal.   Recent Results (from the past 2160 hour(s))  CBC     Status: None   Collection Time: 03/27/23  1:09 PM   Result Value Ref Range   WBC 6.1 4.0 - 10.5 K/uL   RBC 4.31 3.87 - 5.11 MIL/uL   Hemoglobin 12.6 12.0 - 15.0 g/dL   HCT 62.9 52.8 - 41.3 %   MCV 91.6 80.0 - 100.0 fL   MCH 29.2 26.0 - 34.0 pg   MCHC 31.9 30.0 - 36.0 g/dL   RDW 24.4 01.0 - 27.2 %   Platelets 198 150 - 400 K/uL   nRBC 0.0 0.0 - 0.2 %    Comment: Performed at [facility], [address], [place], [place] [address]  Basic metabolic panel     Status: Abnormal   Collection Time: 03/27/23  1:09 PM  Result Value Ref Range   Sodium 139 135 - 145 mmol/L   Potassium 4.2 3.5 - 5.1 mmol/L   Chloride 103 98 - 111 mmol/L   CO2 27 22 - 32 mmol/L   Glucose, Bld 163 (H) 70 - 99 mg/dL    Comment: Glucose reference range applies only to samples taken after fasting for at least 8 hours.   BUN 26 (H) 6 - 20 mg/dL   Creatinine, Ser 4.03 (H) 0.44 - 1.00 mg/dL   Calcium 9.3 8.9 - 47.4 mg/dL   GFR, Estimated 58 (L) >60 mL/min    Comment: (NOTE) Calculated using the CKD-EPI Creatinine Equation (2021)    Anion gap 9 5 - 15    Comment: Performed at [facility], [address] [place], [place] [address]  Hemoglobin  A1C     Status: Abnormal   Collection Time: 05/09/23 11:57 AM  Result Value Ref Range   Hgb A1c MFr Bld 10.4 (H) <5.7 % of total Hgb    Comment: For someone without known diabetes, a hemoglobin A1c value of 6.5% or greater indicates that they may have  diabetes and this should be confirmed with a follow-up  test. . For someone with known diabetes, a value <7% indicates  that their diabetes is well controlled and a value  greater than or equal to 7% indicates suboptimal  control. A1c targets should be individualized based on  duration of diabetes, age, comorbid conditions, and  other considerations. . Currently, no consensus exists regarding use of hemoglobin A1c for diagnosis of diabetes for children. .    Mean Plasma Glucose 252 mg/dL   eAG (mmol/L) 57.8 mmol/L    Comment: . This test was  performed on the Roche cobas c503 platform. Effective 06/12/22, a change in test platforms from the Abbott Architect to the Roche cobas c503 may have shifted HbA1c results compared to historical results. Based on laboratory validation testing conducted at [facility], the Roche platform relative to the Abbott platform had an average increase in HbA1c value of < or = 0.3%. This difference is within accepted  variability established by the [facility]. Note that not all individuals will have had a shift in their results and direct comparisons between historical and current results for testing conducted on different platforms is not recommended.   Lipid panel     Status: Abnormal   Collection Time: 05/09/23 11:57 AM  Result Value Ref Range   Cholesterol 88 <200 mg/dL   HDL 28 (L) > OR = 50 mg/dL   Triglycerides 469 (H) <150 mg/dL    Comment: . If a non-fasting specimen was collected, consider repeat triglyceride testing on a fasting specimen if clinically indicated.  Perry Mount et al. J. of Clin. Lipidol. 2015;9:129-169. Marland Kitchen    LDL Cholesterol (Calc) 31 mg/dL (calc)    Comment: Reference range: <100 . Desirable range <100 mg/dL for primary prevention;   <70 mg/dL for patients with CHD or diabetic patients  with > or = 2 CHD risk factors. Marland Kitchen LDL-C is now calculated using the Martin-Hopkins  calculation, which is a validated novel method providing  better accuracy than the Friedewald equation in the  estimation of LDL-C.  Horald Pollen et al. Lenox Ahr. 6295;284(13): 2061-2068  (http://education.[facility].com/faq/FAQ164)    Total CHOL/HDL Ratio 3.1 <5.0 (calc)   Non-HDL Cholesterol (Calc) 60 <244 mg/dL (calc)    Comment: For patients with diabetes plus 1 major ASCVD risk  factor, treating to a non-HDL-C goal of <100 mg/dL  (LDL-C of <01 mg/dL) is considered a therapeutic  option.   COMPLETE METABOLIC PANEL WITH GFR     Status: Abnormal   Collection  Time: 05/09/23 11:57 AM  Result Value Ref Range   Glucose, Bld 187 (H) 65 - 99 mg/dL    Comment: .            Fasting reference interval . For someone without known diabetes, a glucose value >125 mg/dL indicates that they may have diabetes and this should be confirmed with a follow-up test. .    BUN 33 (H) 7 - 25 mg/dL   Creat 0.27 (H) 2.53 - 1.03 mg/dL   eGFR 46 (L) > OR = 60 mL/min/1.45m2   BUN/Creatinine Ratio 25 (H) 6 - 22 (calc)   Sodium 140 135 - 146  mmol/L   Potassium 4.3 3.5 - 5.3 mmol/L   Chloride 101 98 - 110 mmol/L   CO2 25 20 - 32 mmol/L   Calcium 9.9 8.6 - 10.4 mg/dL   Total Protein 5.6 (L) 6.1 - 8.1 g/dL   Albumin 3.8 3.6 - 5.1 g/dL   Globulin 1.8 (L) 1.9 - 3.7 g/dL (calc)   AG Ratio 2.1 1.0 - 2.5 (calc)   Total Bilirubin 0.4 0.2 - 1.2 mg/dL   Alkaline phosphatase (APISO) 56 37 - 153 U/L   AST 30 10 - 35 U/L   ALT 28 6 - 29 U/L  TSH     Status: Abnormal   Collection Time: 05/09/23 11:57 AM  Result Value Ref Range   TSH 0.33 (L) 0.40 - 4.50 mIU/L  CBC with Differential/Platelet     Status: None   Collection Time: 05/09/23 11:57 AM  Result Value Ref Range   WBC 7.4 3.8 - 10.8 Thousand/uL   RBC 4.24 3.80 - 5.10 Million/uL   Hemoglobin 12.4 11.7 - 15.5 g/dL   HCT 40.9 81.1 - 91.4 %   MCV 90.3 80.0 - 100.0 fL   MCH 29.2 27.0 - 33.0 pg   MCHC 32.4 32.0 - 36.0 g/dL   RDW 78.2 95.6 - 21.3 %   Platelets 233 140 - 400 Thousand/uL   MPV 10.2 7.5 - 12.5 fL   Neutro Abs 4,410 1,500 - 7,800 cells/uL   Lymphs Abs 2,198 850 - 3,900 cells/uL   Absolute Monocytes 622 200 - 950 cells/uL   Eosinophils Absolute 133 15 - 500 cells/uL   Basophils Absolute 37 0 - 200 cells/uL   Neutrophils Relative % 59.6 %   Total Lymphocyte 29.7 %   Monocytes Relative 8.4 %   Eosinophils Relative 1.8 %   Basophils Relative 0.5 %  Urine Microalbumin w/creat. ratio     Status: Abnormal   Collection Time: 05/09/23 11:57 AM  Result Value Ref Range   Creatinine, Urine 40 20 - 275 mg/dL    Microalb, Ur 1.5 mg/dL    Comment: Reference Range Not established    Microalb Creat Ratio 38 (H) <30 mg/g creat    Comment: . The ADA defines abnormalities in albumin excretion as follows: Marland Kitchen Albuminuria Category        Result (mg/g creatinine) . Normal to Mildly increased   <30 Moderately increased         30-299  Severely increased           > OR = 300 . The ADA recommends that at least two of three specimens collected within a 3-6 month period be abnormal before considering a patient to be within a diagnostic category.   TSH     Status: Abnormal   Collection Time: 06/15/23 10:08 AM  Result Value Ref Range   TSH 0.07 (L) 0.40 - 4.50 mIU/L  Fructosamine     Status: None   Collection Time: 06/15/23 10:08 AM  Result Value Ref Range   Fructosamine 226 205 - 285 umol/L      PHQ2/9:    06/20/2023   12:26 PM 05/16/2023   11:02 AM 05/09/2023   11:49 AM 03/19/2023    8:39 AM 11/17/2022    9:52 AM  Depression screen PHQ 2/9  Decreased Interest 2 2 2 2  0  Down, Depressed, Hopeless 1 1 2 1  0  PHQ - 2 Score 3 3 4 3  0  Altered sleeping 0 0 0 0   Tired, decreased energy  0 0 3 0   Change in appetite 1 1 0 0   Feeling bad or failure about yourself  0 0 2 0   Trouble concentrating 3 2 2 1    Moving slowly or fidgety/restless 0 0 0 0   Suicidal thoughts 0 0 0 0   PHQ-9 Score 7 6 11 4    Difficult doing work/chores Somewhat difficult  Not difficult at all Somewhat difficult     phq 9 is positive   Fall Risk:    06/20/2023   12:26 PM 05/16/2023   10:58 AM 05/09/2023   11:00 AM 03/19/2023    8:39 AM 11/17/2022    9:51 AM  Fall Risk   Falls in the past year? 1 1 1 1 1   Number falls in past yr: 1 1 1 1 1   Comment     10  Injury with Fall? 0 0 0 0 1  Risk for fall due to : History of fall(s) Impaired balance/gait History of fall(s) Impaired balance/gait History of fall(s);Impaired balance/gait;Impaired mobility  Follow up Follow up appointment;Falls prevention  discussed;Education provided;Falls evaluation completed Falls prevention discussed Falls prevention discussed;Education provided;Falls evaluation completed Falls prevention discussed;Education provided;Falls evaluation completed      Functional Status Survey: Is the patient deaf or have difficulty hearing?: No Does the patient have difficulty seeing, even when wearing glasses/contacts?: No Does the patient have difficulty concentrating, remembering, or making decisions?: Yes Does the patient have difficulty walking or climbing stairs?: Yes Does the patient have difficulty dressing or bathing?: No Does the patient have difficulty doing errands alone such as visiting a doctor's office or shopping?: No    Assessment & Plan  1. Insulin dependent type 2 diabetes mellitus (HCC)  Continue mediations advised by Endo and life style modification, doing much better now   2. Adult hypothyroidism  Off levothyroxine, keep follow up with endo  3. Pre-op evaluation  May proceed to surgery

## 2023-06-20 ENCOUNTER — Ambulatory Visit: Payer: Medicare Other | Admitting: Family Medicine

## 2023-06-20 ENCOUNTER — Encounter: Payer: Self-pay | Admitting: Family Medicine

## 2023-06-20 VITALS — BP 126/74 | HR 86 | Temp 97.8°F | Resp 14 | Ht 60.0 in | Wt 178.2 lb

## 2023-06-20 DIAGNOSIS — E119 Type 2 diabetes mellitus without complications: Secondary | ICD-10-CM | POA: Diagnosis not present

## 2023-06-20 DIAGNOSIS — Z794 Long term (current) use of insulin: Secondary | ICD-10-CM | POA: Diagnosis not present

## 2023-06-20 DIAGNOSIS — E039 Hypothyroidism, unspecified: Secondary | ICD-10-CM | POA: Insufficient documentation

## 2023-06-20 DIAGNOSIS — Z01818 Encounter for other preprocedural examination: Secondary | ICD-10-CM

## 2023-06-22 ENCOUNTER — Telehealth: Payer: Self-pay

## 2023-06-22 NOTE — Addendum Note (Signed)
Addended by: Letta Kocher A on: 06/22/2023 02:06 PM   Modules accepted: Orders

## 2023-06-22 NOTE — Telephone Encounter (Signed)
Per Dr. Sherron Monday, Patient is to be scheduled for Removal of Interstim Implant, Stage One and Two Interstim Placement with Impedance Check   [name] was contacted and possible surgical dates were discussed, Monday November 11th, 2024 was agreed upon for surgery.   Patient was directed to call [phone] between 1-3pm the day before surgery to find out surgical arrival time.  Instructions were given not to eat or drink from midnight on the night before surgery and have a driver for the day of surgery. On the surgery day patient was instructed to enter through the [facility] entrance of [facility] report the Same Day Surgery desk.   Pre-Admit Testing will be in contact via phone to set up an interview with the anesthesia team to review your history and medications prior to surgery.   Reminder of this information was sent via MyChart to the patient.

## 2023-06-23 ENCOUNTER — Encounter: Payer: Self-pay | Admitting: Emergency Medicine

## 2023-06-23 ENCOUNTER — Emergency Department
Admission: EM | Admit: 2023-06-23 | Discharge: 2023-06-23 | Disposition: A | Discharge status: Home / Self Care | Payer: Medicare Other | Attending: Emergency Medicine | Admitting: Emergency Medicine

## 2023-06-23 ENCOUNTER — Other Ambulatory Visit: Payer: Self-pay

## 2023-06-23 DIAGNOSIS — R0902 Hypoxemia: Secondary | ICD-10-CM | POA: Diagnosis not present

## 2023-06-23 DIAGNOSIS — N189 Chronic kidney disease, unspecified: Secondary | ICD-10-CM | POA: Insufficient documentation

## 2023-06-23 DIAGNOSIS — I959 Hypotension, unspecified: Secondary | ICD-10-CM | POA: Diagnosis not present

## 2023-06-23 DIAGNOSIS — J449 Chronic obstructive pulmonary disease, unspecified: Secondary | ICD-10-CM | POA: Insufficient documentation

## 2023-06-23 DIAGNOSIS — I509 Heart failure, unspecified: Secondary | ICD-10-CM | POA: Insufficient documentation

## 2023-06-23 DIAGNOSIS — I2510 Atherosclerotic heart disease of native coronary artery without angina pectoris: Secondary | ICD-10-CM | POA: Diagnosis not present

## 2023-06-23 DIAGNOSIS — N390 Urinary tract infection, site not specified: Secondary | ICD-10-CM | POA: Diagnosis not present

## 2023-06-23 DIAGNOSIS — R82998 Other abnormal findings in urine: Secondary | ICD-10-CM | POA: Insufficient documentation

## 2023-06-23 DIAGNOSIS — R42 Dizziness and giddiness: Secondary | ICD-10-CM | POA: Insufficient documentation

## 2023-06-23 DIAGNOSIS — E1122 Type 2 diabetes mellitus with diabetic chronic kidney disease: Secondary | ICD-10-CM | POA: Insufficient documentation

## 2023-06-23 DIAGNOSIS — R8290 Unspecified abnormal findings in urine: Secondary | ICD-10-CM

## 2023-06-23 DIAGNOSIS — R110 Nausea: Secondary | ICD-10-CM | POA: Diagnosis not present

## 2023-06-23 LAB — URINALYSIS, ROUTINE W REFLEX MICROSCOPIC
Bacteria, UA: NONE SEEN
Bilirubin Urine: NEGATIVE
Glucose, UA: >=500 mg/dL — AB
Hgb urine dipstick: NEGATIVE
Ketones, ur: NEGATIVE mg/dL
Leukocytes,Ua: NEGATIVE
Nitrite: NEGATIVE
Protein, ur: NEGATIVE mg/dL
Specific Gravity, Urine: 1.025 (ref 1.005–1.030)
pH: 5 (ref 5.0–8.0)

## 2023-06-23 LAB — CBC
HCT: 36.7 % (ref 36.0–46.0)
Hemoglobin: 11.3 g/dL — ABNORMAL LOW (ref 12.0–15.0)
MCH: 29 pg (ref 26.0–34.0)
MCHC: 30.8 g/dL (ref 30.0–36.0)
MCV: 94.1 fL (ref 80.0–100.0)
Platelets: 241 10*3/uL (ref 150–400)
RBC: 3.9 MIL/uL (ref 3.87–5.11)
RDW: 14.5 % (ref 11.5–15.5)
WBC: 5.9 10*3/uL (ref 4.0–10.5)
nRBC: 0 % (ref 0.0–0.2)

## 2023-06-23 LAB — BASIC METABOLIC PANEL
Anion gap: 10 (ref 5–15)
BUN: 37 mg/dL — ABNORMAL HIGH (ref 6–20)
CO2: 21 mmol/L — ABNORMAL LOW (ref 22–32)
Calcium: 9.3 mg/dL (ref 8.9–10.3)
Chloride: 107 mmol/L (ref 98–111)
Creatinine, Ser: 1.32 mg/dL — ABNORMAL HIGH (ref 0.44–1.00)
GFR, Estimated: 47 mL/min — ABNORMAL LOW (ref >60)
Glucose, Bld: 195 mg/dL — ABNORMAL HIGH (ref 70–99)
Potassium: 5 mmol/L (ref 3.5–5.1)
Sodium: 138 mmol/L (ref 135–145)

## 2023-06-23 LAB — CBG MONITORING, ED: Glucose-Capillary: 196 mg/dL — ABNORMAL HIGH (ref 70–99)

## 2023-06-23 MED ORDER — MECLIZINE HCL 25 MG PO TABS
25.0000 mg | ORAL_TABLET | Freq: Once | ORAL | Status: AC
  Administered 2023-06-23: 25 mg via ORAL
  Filled 2023-06-23: qty 1

## 2023-06-23 MED ORDER — ONDANSETRON 4 MG PO TBDP
4.0000 mg | ORAL_TABLET | Freq: Once | ORAL | Status: AC
  Administered 2023-06-23: 4 mg via ORAL
  Filled 2023-06-23: qty 1

## 2023-06-23 NOTE — ED Notes (Addendum)
Patient is waiting for husband to pick her up. Room remains darkened for comfort. No acute distress.

## 2023-06-23 NOTE — ED Triage Notes (Signed)
Pt via [facility] from home. Pt c/o nausea, dizziness that started this AM, reports a hx of vertigo. Report that her urine has had a foul smell for  the past couple of days. Reports that every time she move she feels like she is going to faint. States that the last time she felt like this she was septic. Pt is A&Ox4 and NAD.

## 2023-06-23 NOTE — ED Triage Notes (Signed)
Pt in via EMS from home with c/o nausea and burping. EMS reports when patiens sits up she burps and she is concerned about it.

## 2023-06-23 NOTE — ED Notes (Signed)
Room darkened per patient's request. 

## 2023-06-23 NOTE — ED Notes (Signed)
Patient was able to transfer full independently from wheelchair to commode and back and from wheelchair to stretcher.  Patient states she only has "a little bit" of dizziness now.

## 2023-06-23 NOTE — Discharge Instructions (Addendum)
You were seen in the ER today for evaluation of your dizziness.  Your labs and EKG were reassuring.  Your urine did not show signs of an infection.  I am glad you are feeling better after receiving medication here.  I suspect that you had a flare of your vertigo.  Follow with your primary care doctor for further evaluation.  You can continue to take Zofran and meclizine as needed to help with your symptoms.  Return to the ER for new or worsening symptoms.

## 2023-06-23 NOTE — ED Notes (Signed)
Patient ambulated to hallway bathroom with a steady gait. No c/o dizziness at this time.

## 2023-06-23 NOTE — ED Provider Notes (Signed)
[facility] Provider Note    Event Date/Time   First MD Initiated Contact with Patient 06/23/23 1059     (approximate)   History   Dizziness and Urinary Tract Infection   HPI  [name] is a 57 year old female with history of CHF, CKD, COPD on home O2, CAD, T2DM, OSA presenting to the emergency department for evaluation of dizziness and nausea.  Patient reports a history of vertigo.  When she woke up this morning, experienced dizziness described as a spinning sensation with associated nausea without vomiting.  Says this feels similar to prior episodes of vertigo, but has been more severe.  Dizziness is worse with head movements.  No new numbness, tingling, focal weakness.  Does additionally note that her urine has been malodorous and has previously had UTIs.       Physical Exam   Triage Vital Signs: ED Triage Vitals  Encounter Vitals Group     BP 06/23/23 1043 (!) 113/52     Systolic BP Percentile --      Diastolic BP Percentile --      Pulse Rate 06/23/23 1043 76     Resp 06/23/23 1043 20     Temp 06/23/23 1043 97.8 F (36.6 C)     Temp Source 06/23/23 1043 Oral     SpO2 06/23/23 1043 98 %     Weight 06/23/23 1040 183 lb (83 kg)     Height 06/23/23 1040 4\' 11"  (1.499 m)     Head Circumference --      Peak Flow --      Pain Score 06/23/23 1040 0     Pain Loc --      Pain Education --      Exclude from Growth Chart --     Most recent vital signs: Vitals:   06/23/23 1043  BP: (!) 113/52  Pulse: 76  Resp: 20  Temp: 97.8 F (36.6 C)  SpO2: 98%     General: Awake, interactive  CV:  Regular rate, good peripheral perfusion.  Resp:  Lungs clear, unlabored respirations.  Abd:  Soft, nondistended, nontender to palpation Neuro:  Symmetric facial movement, fluid speech, moves extremity spontaneously and equally, normal extraocular movements  ED Results / Procedures / Treatments   Labs (all labs ordered are listed, but only abnormal  results are displayed) Labs Reviewed  BASIC METABOLIC PANEL - Abnormal; Notable for the following components:      Result Value   CO2 21 (*)    Glucose, Bld 195 (*)    BUN 37 (*)    Creatinine, Ser 1.32 (*)    GFR, Estimated 47 (*)    All other components within normal limits  CBC - Abnormal; Notable for the following components:   Hemoglobin 11.3 (*)    All other components within normal limits  URINALYSIS, ROUTINE W REFLEX MICROSCOPIC - Abnormal; Notable for the following components:   Color, Urine YELLOW (*)    APPearance CLEAR (*)    Glucose, UA >=500 (*)    All other components within normal limits  CBG MONITORING, ED - Abnormal; Notable for the following components:   Glucose-Capillary 196 (*)    All other components within normal limits     EKG EKG independently reviewed interpreted by myself (ER attending) demonstrates:  EKG demonstrates normal sinus rhythm at a rate of 78, PR 192, QRS 100, QTc is 446, no acute ST changes  RADIOLOGY Imaging independently reviewed and interpreted by myself  demonstrates:    PROCEDURES:  Critical Care performed: No  Procedures   MEDICATIONS ORDERED IN ED: Medications  ondansetron (ZOFRAN-ODT) disintegrating tablet 4 mg (4 mg Oral Given 06/23/23 1053)  meclizine (ANTIVERT) tablet 25 mg (25 mg Oral Given 06/23/23 1136)     IMPRESSION / MDM / ASSESSMENT AND PLAN / ED COURSE  I reviewed the triage vital signs and the nursing notes.  Differential diagnosis includes, but is not limited to, exacerbation of chronic vertigo, UTI, anemia, electrolyte abnormality, low suspicion for acute intracranial process given history of similar, absence of focal deficits on exam, no trauma  Patient's presentation is most consistent with acute presentation with potential threat to life or bodily function.  57 year old female presenting with dizziness and nausea, history of similar.  Vital signs stable on presentation.  Labs from triage with mild  anemia with hemoglobin of 11.3.  BMP with stable creatinine elevation.  Patient did not take any medications at home prior to presentation.  Received Zofran in triage.  Will trial meclizine and see if symptoms improve.  Also awaiting urinalysis.  Urinalysis resulted without evidence of infection.  After medication, patient felt much improved.  She was able to ambulate from her wheelchair to the bathroom into the bed without recurrent dizziness.  She was updated on the results of her workup.  She is comfortable with discharge home.  She reports she has access to both Zofran and meclizine already at home.  Strict return precautions were provided.  She was discharged stable condition.    FINAL CLINICAL IMPRESSION(S) / ED DIAGNOSES   Final diagnoses:  Vertigo  Malodorous urine     Rx / DC Orders   ED Discharge Orders     None        Note:  This document was prepared using Dragon voice recognition software and may include unintentional dictation errors.   Trinna Post, MD 06/23/23 [id number]

## 2023-06-28 ENCOUNTER — Telehealth: Payer: Self-pay | Admitting: Gastroenterology

## 2023-06-28 NOTE — Telephone Encounter (Signed)
I called patient to schedule office visit with Dr. Servando Snare. I did advised her of her $15 co pay.

## 2023-07-09 ENCOUNTER — Encounter
Admission: RE | Admit: 2023-07-09 | Discharge: 2023-07-09 | Disposition: A | Discharge status: Home / Self Care | Payer: Medicare Other | Source: Ambulatory Visit | Attending: Urology | Admitting: Urology

## 2023-07-09 ENCOUNTER — Other Ambulatory Visit: Payer: Self-pay

## 2023-07-09 VITALS — Ht 59.0 in | Wt 183.0 lb

## 2023-07-09 DIAGNOSIS — Z01812 Encounter for preprocedural laboratory examination: Secondary | ICD-10-CM

## 2023-07-09 DIAGNOSIS — E1165 Type 2 diabetes mellitus with hyperglycemia: Secondary | ICD-10-CM

## 2023-07-09 NOTE — H&P (Signed)
hen I saw the patient in February 2017 the interstim device x-rays looked in good position.  It was interrogated and the impedance check was normal.  She turned the device up she felt it vaginally.  She had good battery life then.  She recently saw Carollee Herter and had a KUB November 27, 2017.  Impedance check was done and bladder was found to be dead.  She has been having some vaginal discomfort apparently with intercourse and has had a previous bladder suspension.   The patient thinks her InterStim was working beautifully until several weeks ago.  She reported being almost completely dry.  Now she has urge incontinence and milder stress incontinence.  She does not wear pads because she says they irritate her.  She changes her close several times.  She ranks the incontinence is milder.  She is now getting up 4 times at night and set up twice.  She can void every 15-20 minutes and cannot hold it for 2 hours.  She feels discomfort or pressure over the IPG.  She is allergic to amoxicillin   December 31, 2017 the patient had the IPG change.  All 4-lead positions looked excellent with good Bellows and toe responses at very low amplitude.   We can see dramatically better.  Improved urge incontinence.  She still has mild stress incontinence and I educated her regarding this.  Incisions look normal.  Some basic teaching was provided by our nurses.     Patient had called in leaking and was trouble shot by Maralyn Sago on Jan 21, 2018. e By history I believe that this did help significantly but in the last 6 weeks she started to leak more again.  She can leak sitting not associated with awareness.  She has moderate bedwetting.  She has urge incontinence.  She can soak a pad when she goes out in public.  At home she changes her close.  She does not have cystitis symptoms but urine sent for culture.  She does not change the settings whatsoever   She did not feel it rectally and vaginally at 1.1 but did at 1.5 and was left at that  amplitude.  Again there was a lot of time needed to connect to the device for unknown reasons.  This happened last time as well.  Maralyn Sago will see her in 2 weeks and make further adjustments.  We do not think the patient is able to do this on her own.  She was told to improve her diet since she had glucose in the urine.  Urine cultures positive     Today I have not seen the patient since August 2019.  The patient said 2 or 3 months ago she was wearing 1 pad a day especially when she went out in public.  Now she wears 3 pads a day that are quite wet.  She leaks with coughing sneezing and with urgency.  She can leak without awareness.  She has bedwetting.  She will wake up and also leak before she gets to the bedside commode  She is voiding every 2-3 hours gets up 3 times a night.  When she turns up the amplitude she feels it vaginally.  She does not know how to change programs  Clinically not infected     due to staffing issues we will need to have the [employer] rep come in and troubleshoot the device. We will proceed accordingly. Call if urine culture is positive    Today Patient reports in  the last 1 year she has been having urge incontinence soaking 3 pads a day.  She has bedwetting.  She is a bedside commode.  She voids every 1 hour and cannot hold it for 2 hours.  She gets up twice a night.  She said she was hospitalized once with sepsis.  1 out of the last for 5 urine cultures in the healthcare system record was positive and the rest were negative.  She is not on any medication for overactive bladder.  She had a new battery in 2019.  The device was placed in 2017 earlier.  No cystitis symptoms.  When her and her husband change programs or increased amplitude she does not feel anything   Reviewed a note from 2017.  She had actually had InterStim placed in 2011 by Dr. Barnabas Lister.  She was having urge incontinence and bedwetting wearing 4-5 pads a day.  She also had some stress incontinence.  When I  saw her back in 2017 the battery had expired.       PMH:     Past Medical History:  Diagnosis Date   Anxiety     Arthritis     Asthma     Bipolar depression (HCC)     CHF (congestive heart failure) (HCC)     Chronic headaches      migraines have lessened   Chronic kidney disease      ddstage 111   COPD (chronic obstructive pulmonary disease) (HCC)     Coronary artery disease     Depression     Diabetic neuropathy (HCC)     Dyspnea      uses inhalers for this   Dysrhythmia      rapid   Fatty liver     GERD (gastroesophageal reflux disease)     Headache     Heart attack (HCC) 2009   Hyperlipidemia     Hypertension     Hypomagnesemia     Hypothyroid     Irregular heart rhythm     Liver lesion      Favored to be benign on CT; MRI of abd w/contract in Aug 2016. Liver Biopsy-Negative, March 2016   Morbid obesity (HCC)     OSA (obstructive sleep apnea)     Peripheral vascular disease (HCC)     Pinched nerve      Back   Pneumonia     RLS (restless legs syndrome)     Seasonal allergies     Sleep apnea      uses oxygen at night 2L d/t cpap did not fit   Type 2 diabetes, uncontrolled, with neuropathy 10/15/2015    Overview:  Microalbumin 7.7 12/2010: Hgb A1c 10.2% 12/2010 and 9.7% 03/2011, eye exam 10/2009 [facility]    Urinary incontinence     Vitamin D deficiency            Surgical History:      Past Surgical History:  Procedure Laterality Date   ABDOMINAL HYSTERECTOMY        Partial   bladder stim   2007   BLADDER SURGERY        x 2. tacking of bladder and the other was the stimulator placement   CHOLECYSTECTOMY       COLONOSCOPY WITH PROPOFOL N/A 03/04/2019    Procedure: COLONOSCOPY WITH PROPOFOL;  Surgeon: Midge Minium, MD;  Location: [facility] ENDOSCOPY;  Service: Endoscopy;  Laterality: N/A;   ESOPHAGOGASTRODUODENOSCOPY (EGD) WITH PROPOFOL N/A 03/04/2019  Procedure: ESOPHAGOGASTRODUODENOSCOPY (EGD) WITH PROPOFOL;  Surgeon: Midge Minium, MD;  Location: [facility]  ENDOSCOPY;  Service: Endoscopy;  Laterality: N/A;   INCISION AND DRAINAGE ABSCESS Right 04/16/2019    Procedure: INCISION AND DRAINAGE ABSCESS;  Surgeon: Sung Amabile, DO;  Location: [facility] ORS;  Service: General;  Laterality: Right;   INTERSTIM-GENERATOR CHANGE N/A 12/31/2017    Procedure: INTERSTIM-GENERATOR CHANGE;  Surgeon: Alfredo Martinez, MD;  Location: [facility] ORS;  Service: Urology;  Laterality: N/A;   LIVER BIOPSY   March 2016    Negative   LUNG BIOPSY   2015    small spot on lung   TUBAL LIGATION       UPPER GI ENDOSCOPY       WRIST SURGERY Right 11/02/2019          Home Medications:  Allergies as of 03/05/2023         Reactions    Mirtazapine Anaphylaxis    REACTION: closes throat    Morphine And Codeine      anaphylaxis    Sulfamethoxazole-trimethoprim Rash    REACTION: Whelps all over    Amoxicillin Er Nausea And Vomiting    Pt doesn't remember problem with amoxicillin -  SE Has patient had a PCN reaction causing immediate rash, facial/tongue/throat swelling, SOB or lightheadedness with hypotension: No Has patient had a PCN reaction causing severe rash involving mucus membranes or skin necrosis: No Has patient had a PCN reaction that required hospitalization: No Has patient had a PCN reaction occurring within the last 10 years: no If all of the above answers are "NO", then may proceed with Cephalosporin use.    Codeine Itching    REACTION: itch, rash    Hydrocodone-acetaminophen Rash    REACTION: rash    Keflex [cephalexin] Rash    Pt does not remember having a reaction or rash, no airway involvement    Prasterone Nausea And Vomiting            Medication List           Accurate as of March 05, 2023  8:58 AM. If you have any questions, ask your nurse or doctor.              atorvastatin 80 MG tablet Commonly known as: LIPITOR Take 80 mg by mouth at bedtime.    busPIRone 30 MG tablet Commonly known as: BUSPAR Take 30 mg by mouth 2 (two) times daily.     citalopram 20 MG tablet Commonly known as: CELEXA Take 20 mg by mouth daily.    esomeprazole 40 MG capsule Commonly known as: NEXIUM TAKE 1 CAPSULE BY MOUTH IN THE MORNING    ezetimibe 10 MG tablet Commonly known as: ZETIA Take 10 mg by mouth at bedtime.    fenofibrate micronized 134 MG capsule Commonly known as: LOFIBRA Take 134 mg by mouth at bedtime.    fluticasone 50 MCG/ACT nasal spray Commonly known as: FLONASE PLACE 2 SPRAYS INTO BOTH NOSTRILS DAILY    gabapentin 300 MG capsule Commonly known as: NEURONTIN Take 300 mg by mouth 2 (two) times daily.    gabapentin 600 MG tablet Commonly known as: NEURONTIN Take 600 mg by mouth at bedtime.    Glyxambi 25-5 MG Tabs Generic drug: Empagliflozin-linaGLIPtin Take 1 tablet by mouth daily.    HumaLOG KwikPen 100 UNIT/ML KwikPen Generic drug: insulin lispro Inject into the skin.    HumuLIN R U-500 KwikPen 500 UNIT/ML KwikPen Generic drug: insulin regular human CONCENTRATED Inject 70 Units into  the skin 3 (three) times daily with meals.    hydrOXYzine 50 MG tablet Commonly known as: ATARAX Take 50 mg by mouth 3 (three) times daily.    Insulin Aspart FlexPen 100 UNIT/ML Commonly known as: NOVOLOG Inject into the skin.    lamoTRIgine 200 MG tablet Commonly known as: LAMICTAL Take 200 mg by mouth 2 (two) times daily.    Linzess 145 MCG Caps capsule Generic drug: linaclotide TAKE 1 CAPSULE BY MOUTH DAILY BEFORE BREAKFAST What changed: See the new instructions.    meclizine 25 MG tablet Commonly known as: ANTIVERT TAKE 1 TABLET BY MOUTH 3 TIMES DAILY AS NEEDED FOR DIZZINESS OR NAUSEA    metoprolol tartrate 25 MG tablet Commonly known as: LOPRESSOR Take 1 tablet (25 mg total) by mouth 2 (two) times daily. NEED OV.    nitroGLYCERIN 0.4 MG SL tablet Commonly known as: NITROSTAT Place 1 tablet (0.4 mg total) under the tongue every 5 (five) minutes as needed for chest pain.    ondansetron 4 MG disintegrating  tablet Commonly known as: ZOFRAN-ODT Take 1 tablet (4 mg total) by mouth every 8 (eight) hours as needed.    OneTouch Delica Lancets 30G Misc by Does not apply route.    Pen Needles 31G X 8 MM Misc    promethazine 25 MG suppository Commonly known as: PHENERGAN Place 1 suppository (25 mg total) rectally every 6 (six) hours as needed for nausea or vomiting.    QUEtiapine 300 MG tablet Commonly known as: SEROQUEL Take 300 mg by mouth at bedtime. Along with 200mg  dose for a total of 500mg     SUMAtriptan 100 MG tablet Commonly known as: IMITREX TAKE 1 TABLET AT ONSET OF MIGRAINE. MAY REPEAT IN 2 HOURS IF HEADACHE PERSISTS OR RECURS.    Synthroid 100 MCG tablet Generic drug: levothyroxine Take 100 mcg by mouth daily.    Trulicity 1.5 MG/0.5ML Sopn Generic drug: Dulaglutide Inject 1.5 mg into the skin once a week. (Wednesdays)    Ubrelvy 100 MG Tabs Generic drug: Ubrogepant Take 1 tablet by mouth every other day.             Allergies:  Allergies       Allergies  Allergen Reactions   Mirtazapine Anaphylaxis      REACTION: closes throat   Morphine And Codeine        anaphylaxis   Sulfamethoxazole-Trimethoprim Rash      REACTION: Whelps all over   Amoxicillin Er Nausea And Vomiting      Pt doesn't remember problem with amoxicillin -  SE Has patient had a PCN reaction causing immediate rash, facial/tongue/throat swelling, SOB or lightheadedness with hypotension: No Has patient had a PCN reaction causing severe rash involving mucus membranes or skin necrosis: No Has patient had a PCN reaction that required hospitalization: No Has patient had a PCN reaction occurring within the last 10 years: no If all of the above answers are "NO", then may proceed with Cephalosporin use.   Codeine Itching      REACTION: itch, rash   Hydrocodone-Acetaminophen Rash      REACTION: rash   Keflex [Cephalexin] Rash      Pt does not remember having a reaction or rash, no airway involvement    Prasterone Nausea And Vomiting        Family History:      Family History  Problem Relation Age of Onset   Diabetes Mother     Heart disease Mother  Hyperlipidemia Mother     Hypertension Mother     Kidney disease Mother     Mental illness Mother     Hypothyroidism Mother     Stroke Mother     Osteoporosis Mother     Glaucoma Mother     Congestive Heart Failure Mother     Hypertension Father     Kidney disease Brother     Intracerebral hemorrhage Brother     Breast cancer Maternal Grandmother 60   Heart disease Maternal Grandfather     Rheum arthritis Maternal Grandfather     Cancer Maternal Grandfather          liver   Kidney disease Maternal Grandfather     Cancer Paternal Grandmother          lung   Asthma Daughter     Cancer Daughter          throat   Bipolar disorder Daughter     Bipolar disorder Daughter     Hypertension Daughter     Restless legs syndrome Daughter            Social History:  reports that she has never smoked. She has never used smokeless tobacco. She reports that she does not drink alcohol and does not use drugs.   ROS:                           Physical Exam: BP 139/80   Ht 4\' 11"  (1.499 m)   Wt 87.5 kg   BMI 38.98 kg/m   Constitutional:  Alert and oriented, No acute distress. HEENT: [place] AT, moist mucus membranes.  Trachea midline, no masses. Cardiovascular: No clubbing, cyanosis, or edema.     Laboratory Data: Recent Labs       Lab Results  Component Value Date    WBC 7.0 02/21/2023    HGB 13.0 02/21/2023    HCT 41.4 02/21/2023    MCV 92.2 02/21/2023    PLT 215 02/21/2023        Recent Labs       Lab Results  Component Value Date    CREATININE 1.27 (H) 02/21/2023        Recent Labs  No results found for: "PSA"     Recent Labs  No results found for: "TESTOSTERONE"     Recent Labs       Lab Results  Component Value Date    HGBA1C 8.2 10/24/2022        Urinalysis Labs (Brief)           Component Value Date/Time    COLORURINE YELLOW (A) 02/21/2023 1829    APPEARANCEUR CLEAR (A) 02/21/2023 1829    APPEARANCEUR Hazy (A) 09/19/2021 0937    LABSPEC 1.030 02/21/2023 1829    LABSPEC 1.026 10/09/2014 1808    PHURINE 5.0 02/21/2023 1829    GLUCOSEU >=500 (A) 02/21/2023 1829    GLUCOSEU >=500 10/09/2014 1808    HGBUR NEGATIVE 02/21/2023 1829    HGBUR moderate 01/04/2009 0913    BILIRUBINUR NEGATIVE 02/21/2023 1829    BILIRUBINUR negative 11/17/2022 0955    BILIRUBINUR Negative 09/19/2021 0937    BILIRUBINUR Negative 10/09/2014 1808    KETONESUR NEGATIVE 02/21/2023 1829    PROTEINUR NEGATIVE 02/21/2023 1829    UROBILINOGEN 0.2 11/17/2022 0955    UROBILINOGEN 0.2 01/04/2009 0913    NITRITE NEGATIVE 02/21/2023 1829    LEUKOCYTESUR NEGATIVE 02/21/2023 1829    LEUKOCYTESUR Negative  10/09/2014 1808        Pertinent Imaging: Urine reviewed.  Urine sent for culture.  Chart reviewed   Assessment & Plan: It appears that the lead is approximately 59 years old and the IPG was replaced in 2019.  I will get a lateral and AP view of the sacrum and have her see our nurse practitioner for troubleshooting and assessment.  I will give her Gemtesa samples in the interim since she is never tried this medication.  Call if culture positive.  It would likely be in the best interest of the patient that if we replaced the IPG battery that the lead is also replaced.  I did review this in full detail today but she still needs to be assessed.  Sequelae of retained tip discussed.  Increased risk of infection discussed.  Unsatisfactory results discussed.   Again the lead is 59 years old and she agreed that we will go ahead and replace the entire device.  I have counseled her for surgery but I want the above steps performed and we will proceed accordingly   Patient is on blood thinners and we need to have clearance to discontinue them

## 2023-07-09 NOTE — Pre-Procedure Instructions (Signed)
Call to patient to review any updates/questions on new surgery date. Had pre-op interview in September. Surgery was cancelled due to elevated Hgb AIC and high glucose levels. Seen by endocrinologist, Dr. Standley Brooking on June 18, 2023 and had some medication changes. Copy and pasted from note in care everywhere:  Patient Instructions - documented in this encounter  Patient Instructions Aldean Baker, MD - 06/18/2023 2:00 PM EDT  Formatting of this note might be different from the original. Decrease Humulin R U-500 to 50 units with meals Stop taking Glyxambi. Continue Jardiance. Use Humalog only for glucose over 300.  Don't take Trulicity for 3 weeks and see if your stomach symptoms improved. Stop taking levothyroxine.  We will get you a continuous monitor. Electronically signed by Aldean Baker, MD at 06/18/2023 3:02 PM EDT  Ordered Prescriptions - documented in this encounter  Ordered Prescriptions Prescription Sig Dispense Quantity Refills Last Filled Start Date End Date  empagliflozin (JARDIANCE) 25 mg tablet  Indications: Type 2 diabetes mellitus with hyperglycemia, with long-term current use of insulin (CMS/HHS-HCC) Take 1 tablet (25 mg total) by mouth once daily 30 tablet  5   06/18/2023    CONCENTRATED insulin REGULAR (HUMULIN R U-500 "CONCENTRATED") 500 unit/mL (3 mL) pen injector  Indications: Type 2 diabetes mellitus with hyperglycemia, with long-term current use of insulin (CMS/HHS-HCC)         When talking with the patient over the phone; patient was unsure about what medications she was taking and or stopped since her medications get filled from the pharmacy in bubble packs. Requested patient to bring the actual bubble packs into pre-admission testing department. Daughter brought in a pharmacy medication card with medications listed on it however, it was not the current, updated card. Discussed the medication discrepancies with the daughter and printed off the  instructions from the endocrinologist. Daughter acknowledged understanding and will relay information to her mother (the patient) and father (assists patient with medications). Medication updated in EPIC as the most current ordered prescriptions. Written pre-op instructions and CHG soap given to the patient's daughter to take home to her mother (patient).

## 2023-07-09 NOTE — Patient Instructions (Addendum)
Your procedure is scheduled on: Monday, November 11 Report to the Registration Desk on the 1st floor of the [facility] To find out your arrival time, please call [phone] between 1PM - 3PM on: Friday, November 8 If your arrival time is 6:00 am, do not arrive before that time as the [facility] entrance doors do not open until 6:00 am.  REMEMBER: Instructions that are not followed completely may result in serious medical risk, up to and including death; or upon the discretion of your surgeon and anesthesiologist your surgery may need to be rescheduled.  Do not eat or drink after midnight the night before surgery.  No gum chewing or hard candies.  One week prior to surgery: starting November 4 Stop Anti-inflammatories (NSAIDS) such as Advil, Aleve, Ibuprofen, Motrin, Naproxen, Naprosyn and Aspirin based products such as Excedrin, Goody's Powder, BC Powder. Stop ANY OVER THE COUNTER supplements until after surgery. Stop multiple vitamin.  Empagliflozin (Jardiance) hold for 3 days before surgery. Last day to take Jardiance is Thursday, November 7. Resume AFTER surgery.  You may however, continue to take Tylenol if needed for pain up until the day of surgery.  Continue taking all of your other prescription medications up until the day of surgery.  ON THE DAY OF SURGERY ONLY TAKE THESE MEDICATIONS WITH SIPS OF WATER:  Buspirone (Buspar) Citalopram (Celexa) Lamotrigine (Lamictal) Metoprolol Synthroid  Do NOT take ANY insulin on the day of surgery.  No Alcohol for 24 hours before or after surgery.  No Smoking including e-cigarettes for 24 hours before surgery.  No chewable tobacco products for at least 6 hours before surgery.  No nicotine patches on the day of surgery.  Do not use any "recreational" drugs for at least a week (preferably 2 weeks) before your surgery.  Please be advised that the combination of cocaine and anesthesia may have negative outcomes, up to and  including death. If you test positive for cocaine, your surgery will be cancelled.  On the morning of surgery brush your teeth with toothpaste and water, you may rinse your mouth with mouthwash if you wish. Do not swallow any toothpaste or mouthwash.  Use CHG Soap as directed on instruction sheet.  Do not wear jewelry, make-up, hairpins, clips or nail polish.  For welded (permanent) jewelry: bracelets, anklets, waist bands, etc.  Please have this removed prior to surgery.  If it is not removed, there is a chance that hospital personnel will need to cut it off on the day of surgery.  Do not wear lotions, powders, or perfumes.   Do not shave body hair from the neck down 48 hours before surgery.  Contact lenses, hearing aids and dentures may not be worn into surgery.  Do not bring valuables to the hospital. [facility] is not responsible for any missing/lost belongings or valuables.   Notify your doctor if there is any change in your medical condition (cold, fever, infection).  Wear comfortable clothing (specific to your surgery type) to the hospital.  After surgery, you can help prevent lung complications by doing breathing exercises.  Take deep breaths and cough every 1-2 hours.   If you are being discharged the day of surgery, you will not be allowed to drive home. You will need a responsible individual to drive you home and stay with you for 24 hours after surgery.   If you are taking public transportation, you will need to have a responsible individual with you.  Please call the Pre-admissions Testing  Dept. at [phone] if you have any questions about these instructions.  Surgery Visitation Policy:  Patients having surgery or a procedure may have two visitors.  Children under the age of 47 must have an adult with them who is not the patient.     Preparing for Surgery with CHLORHEXIDINE GLUCONATE (CHG) Soap  Chlorhexidine Gluconate (CHG) Soap  o An antiseptic  cleaner that kills germs and bonds with the skin to continue killing germs even after washing  o Used for showering the night before surgery and morning of surgery  Before surgery, you can play an important role by reducing the number of germs on your skin.  CHG (Chlorhexidine gluconate) soap is an antiseptic cleanser which kills germs and bonds with the skin to continue killing germs even after washing.  Please do not use if you have an allergy to CHG or antibacterial soaps. If your skin becomes reddened/irritated stop using the CHG.  1. Shower the NIGHT BEFORE SURGERY and the MORNING OF SURGERY with CHG soap.  2. If you choose to wash your hair, wash your hair first as usual with your normal shampoo.  3. After shampooing, rinse your hair and body thoroughly to remove the shampoo.  4. Use CHG as you would any other liquid soap. You can apply CHG directly to the skin and wash gently with a scrungie or a clean washcloth.  5. Apply the CHG soap to your body only from the neck down. Do not use on open wounds or open sores. Avoid contact with your eyes, ears, mouth, and genitals (private parts). Wash face and genitals (private parts) with your normal soap.  6. Wash thoroughly, paying special attention to the area where your surgery will be performed.  7. Thoroughly rinse your body with warm water.  8. Do not shower/wash with your normal soap after using and rinsing off the CHG soap.  9. Pat yourself dry with a clean towel.  10. Wear clean pajamas to bed the night before surgery.  12. Place clean sheets on your bed the night of your first shower and do not sleep with pets.  13. Shower again with the CHG soap on the day of surgery prior to arriving at the hospital.  14. Do not apply any deodorants/lotions/powders.  15. Please wear clean clothes to the hospital.

## 2023-07-12 ENCOUNTER — Encounter: Payer: Self-pay | Admitting: Urology

## 2023-07-12 NOTE — Progress Notes (Signed)
Perioperative / Anesthesia Services  Pre-Admission Testing Clinical Review / Pre-Operative Anesthesia Consult  Date: 07/12/23  Patient Demographics:  Name: [name] DOB:   1965-12-19 MRN:   [id number]  Planned Surgical Procedure(s):    Case: [id number] Date/Time: 07/16/23 1245   Procedures:      REMOVAL OF INTERSTIM IMPLANT     INTERSTIM IMPLANT FIRST STAGE WITH IMPEDANCE CHECK     INTERSTIM IMPLANT SECOND STAGE   Anesthesia type: Monitor Anesthesia Care   Pre-op diagnosis: Malfunctioning Interstim, Refractory Urge Incontinence   Location: [facility] OR ROOM 08 / [facility]   Surgeons: Alfredo Martinez, MD     NOTE: Available PAT nursing documentation and vital signs have been reviewed. Clinical nursing staff has updated patient's PMH/PSHx, current medication list, and drug allergies/intolerances to ensure comprehensive history available to assist in medical decision making as it pertains to the aforementioned surgical procedure and anticipated anesthetic course. Extensive review of available clinical information personally performed. [name] PMH and PSHx updated with any diagnoses/procedures that  may have been inadvertently omitted during her intake with the pre-admission testing department's nursing staff.  Clinical Discussion:  [name] is a 57 y.o. female who is submitted for pre-surgical anesthesia review and clearance prior to her undergoing the above procedure. Patient has never been a smoker. Pertinent PMH includes: CAD, ? MI, HFpEF, TIA, tachycardia, palpitations, PVD, aortic atherosclerosis, chronic cerebral microvascular ischemic changes,  HTN, HLD, T2DM, hypothyroidism, COPD, asthma, OSAH (no nocturnal PAP therapy), GERD (on daily PPI), gastric ulcer, IDA, NASH, interstitial cystitis, OA, diabetic neuropathy, schizoaffective disorder, bipolar depression, anxiety, recurrent falls secondary to severe vertigo.  Patient is followed by cardiology  Stacey Milling, MD). She was last seen in the cardiology clinic on 05/11/2023; notes reviewed. At the time of her clinic visit, patient doing well overall from a cardiovascular perspective.  Patient's main complaint during the visit was extreme vertigo.  Patient had experienced several falls at home resulting in visits to the emergency department.  Patient with infrequent episodes of palpitations.  Patient denied any chest pain, shortness of breath, PND, orthopnea,, significant peripheral edema, weakness, fatigue, or presyncope/syncope. Patient with a past medical history significant for cardiovascular diagnoses. Documented physical exam was grossly benign, providing no evidence of acute exacerbation and/or decompensation of the patient's known cardiovascular conditions.  Of note, complete records regarding patient's cardiovascular history unavailable for review at time of consult.  Information gathered from available records and from patient interview.  Patient reported to have suffered an MI in 2009.  Unable to locate any information regarding this cardiovascular event.  I did not see myocardial infarction listed as part of her history with cardiology.  Details unclear at this time.  Patient underwent diagnostic LEFT cardiac catheterization on 04/13/2011 revealing normal coronary anatomy with no evidence of obstructive coronary artery disease.  Myocardial perfusion imaging study was performed on 10/29/2020 revealing a normal left ventricular systolic function with an EF of 65%.  There were no regional wall motion abnormalities.  CT attenuation correction images demonstrated mild aortic atherosclerosis and moderate proximal to mid LAD disease.  There was no evidence of stress-induced myocardial ischemia or arrhythmia; no scintigraphic evidence of scar.  Study determined to be normal and low risk.  Patient began experiencing TIA symptoms in early 2023.  Symptom constellation has included AMS, gait abnormalities,  recurrent falls, and changes to her speech.  Multiple CT scans (12 in total) of the brain were performed between 09/2021 and 04/2023. Each imaging  study was found to be unrevealing with no significant intracranial abnormalities. Neurology was consulted and patient was started on daily DAPT per their recommendation in 10/2021.  Long-term cardiac event monitor study performed on 11/24/2021 revealing a predominant underlying normal sinus rhythm at an average rate of 85 bpm; range 56-127 bpm.  There were no significant arrhythmias or prolonged pauses noted.  Most recent TTE with bubble study was performed on 02/12/2022 revealing normal left ventricular systolic function with an EF of 55 to 60%.  There were no regional wall motion abnormalities.  Mild LVH noted. Left ventricular diastolic Doppler parameters consistent with abnormal relaxation (G1DD).  Right ventricular size and function normal.  All transvalvular gradients were noted to be normal providing no evidence suggestive of valvular stenosis.  Aorta normal in size with no evidence of aneurysmal dilatation.  Agitated saline contrast bubble study was negative with no evidence of intra-atrial shunting.  Patient remains on daily DAPT therapy using ASA + clopidogrel.  Patient reportedly compliant with therapy with no evidence reports of GI/GU related bleeding. Blood pressure reasonably controlled at 138/84 mmHg on currently prescribed beta-blocker (metoprolol tartrate) monotherapy.  Patient using the beta-blocker more for control of her palpitations rather than her blood pressure.  She has been out of this medication for over 2 weeks and blood pressure noted to still be relatively well-controlled. Patient is on atorvastatin + ezetimibe + fenofibrate for her HLD diagnosis and ASCVD prevention.  Patient has a supply of short acting nitrates (NTG) to use on a as needed basis for recurrent angina/anginal equivalent symptoms; denied recent use.  T2DM poorly  controlled on currently prescribed regimen; last HgbA1c was 10.4% when checked on 05/09/2023. Patient does have an OSAH diagnosis, however he does not use nocturnal PAP therapy.  Patient does experience nocturnal hypoxia, thus she requires the use of supplemental oxygen (2L/[place]).  Functional capacity somewhat limited by patient's chronic medical comorbidities.  With that said, she is still able to complete her ADLs/IADLs without significant cardiovascular limitation.  Per the DASI, patient is able to achieve at least 4 METS of physical activity without experiencing any significant degree of angina/anginal equivalent symptoms.  Metoprolol was refilled and patient was advised to restart.  No changes were made to her medication regimen.  Patient to follow-up with outpatient cardiology in 1 year or sooner if needed.  [name] is scheduled for an REMOVAL OF INTERSTIM IMPLANT; INTERSTIM IMPLANT FIRST STAGE WITH IMPEDANCE CHECK; INTERSTIM IMPLANT SECOND STAGE on 07/12/2023 with Dr. Alfredo Martinez, MD.  Given patient's past medical history significant for cardiovascular diagnoses, presurgical cardiac clearance was sought by the PAT team. Surgery was initially scheduled back in September of this year, however due to grossly uncontrolled diabetes, internal medicine asked that surgery be postponed. Since that time, patient has made appropriate modifications to her diet and lifestyle. She has consulted with endocrinology at this point. Recent fructosamine level within normal range at 226 umol/L (205-285 umol/L). She has been seen in follow up consult by her internal medicine physician who has provided clearance for her to proceed. Clearances have been issued as follows.  Per internal medicine Carlynn Purl, MD), "initially scheduled for 05/21/2023 , however her A1C at the time was over 10.4 % and due to risk of infection we advised her to postpone procedure until glucose under better control. she has been compliant with  medications for DM, she has changed her diet and is losing weight. She established care with a new Endo last week and had  some medication adjustments .  She had a fructosamine level last week was within normal limits. We will clear her for surgery today".  Per cardiology (Hammock, NP-C), "patient denies shortness of breath, changes in chronic chest discomfort and she states that her chest discomfort has been decreased with a decrease in her palpitations.  She also thinks that her nerve stimulator has an impact on her discomfort as well.  She is scheduled to undergo upcoming procedure movable of InterStim implant, stage I and II InterStim placement with impedance check by Dr. Sherron Monday. According to ACC/AHA guidelines no further cardiovascular testing stating the patient may proceed to surgery at ACCEPTABLE risk".  Again, this patient is on daily DAPT therapy using ASA + clopidogrel.  He has been instructed on recommendations for holding these medications for 5 days prior to his procedure with plans to restart as soon as postoperative bleeding risk felt to be minimized by his primary attending surgeon.  The patient is aware that his last dose of his ASA and clopidogrel should be on 07/10/2023.    Patient denies previous perioperative complications with anesthesia in the past. In review of the available records, it is noted that patient underwent a general anesthetic course here at [facility] ([facility]) in 04/2019 without documented complications.      07/09/2023    9:02 AM 06/23/2023   10:43 AM 06/23/2023   10:40 AM  Vitals with BMI  Height 4\' 11"   4\' 11"   Weight 183 lbs  183 lbs  BMI 36.94  36.94  Systolic  113   Diastolic  52   Pulse  76     Providers/Specialists:   NOTE: Primary physician provider listed below. Patient may have been seen by APP or partner within same practice.   PROVIDER ROLE / SPECIALTY LAST Maricela Bo, MD Urology (Surgeon)  07/09/2023  Alba Cory, MD Primary Care Provider 06/20/2023  Julien Nordmann, MD Cardiology 05/11/2023  Ronald Lobo, MD Endocrinology 06/18/2023   Allergies:  Mirtazapine, Morphine and codeine, Sulfamethoxazole-trimethoprim, Amoxicillin er, Codeine, Hydrocodone-acetaminophen, Keflex [cephalexin], and Prasterone  Current Home Medications:   No current facility-administered medications for this encounter.    atorvastatin (LIPITOR) 80 MG tablet   busPIRone (BUSPAR) 30 MG tablet   cetirizine (ZYRTEC) 10 MG tablet   citalopram (CELEXA) 20 MG tablet   ezetimibe (ZETIA) 10 MG tablet   fenofibrate micronized (LOFIBRA) 134 MG capsule   fluticasone (FLONASE) 50 MCG/ACT nasal spray   gabapentin (NEURONTIN) 600 MG tablet   HUMALOG KWIKPEN 100 UNIT/ML KwikPen   HUMULIN R U-500 KWIKPEN 500 UNIT/ML kwikpen   hydrOXYzine (ATARAX) 50 MG tablet   lamoTRIgine (LAMICTAL) 200 MG tablet   meclizine (ANTIVERT) 25 MG tablet   metoprolol tartrate (LOPRESSOR) 25 MG tablet   Multiple Vitamin (MULTI-VITAMIN) tablet   nitroGLYCERIN (NITROSTAT) 0.4 MG SL tablet   ondansetron (ZOFRAN-ODT) 4 MG disintegrating tablet   QUEtiapine (SEROQUEL) 300 MG tablet   empagliflozin (JARDIANCE) 25 MG TABS tablet   OneTouch Delica Lancets 30G MISC   SYNTHROID 88 MCG tablet   History:   Past Medical History:  Diagnosis Date   Adenomatous colon polyp    Anxiety    Arthritis    Asthma    Atherosclerosis of aorta (HCC)    Bacteremia due to Escherichia coli 09/10/2022   Bipolar depression (HCC)    Cerebral microvascular disease    CHF (congestive heart failure) (HCC)    a.) TTE 10/05/2021: EF 60-65%, no RWMAs,  norm RVSF, G1DD; b.) TTE with bubble study 02/12/2022: EF 55-60%, mild LVH, no RWMAs, no IAS, G1DD   Chronic bronchitis (HCC)    CKD (chronic kidney disease), stage III (HCC)    Constipation    a.) on linaclotide   COPD (chronic obstructive pulmonary disease) (HCC)    Coronary artery disease    a.)  LHC 04/13/2011: normal coronaries; no CAD; b.) MV 10/29/2020: mod p-mLAD disease   Depression    Diabetic neuropathy (HCC)    Dyspnea    Encephalopathy acute    Gastric ulcer    GERD (gastroesophageal reflux disease)    Headache    Heart attack (HCC) 2009   Hepatomegaly    History of 2019 novel coronavirus disease (COVID-19) 04/28/2020   PCR (+): 04/28/2020, 02/14/2021   Hyperlipidemia    Hypertension    Hypomagnesemia    Hypothyroid    IC (interstitial cystitis)    IDA (iron deficiency anemia)    IgG deficiency (HCC)    Liver lesion    Favored to be benign on CT; MRI of abd w/contract in Aug 2016. Liver Biopsy-Negative, March 2016   Long term current use of aspirin    Long term current use of clopidogrel    Migraines    Morbid obesity (HCC)    NASH (nonalcoholic steatohepatitis)    OSA (obstructive sleep apnea)    a.) no nocturnal PAP therapy; issues with fit of DME; using 2L/[place] supplemental oxygen   Palpitation    Peripheral vascular disease (HCC)    Pneumonia    Recurrent falls    RLS (restless legs syndrome)    Schizoaffective disorder, bipolar type (HCC)    Seasonal allergies    Tachycardia    TIA (transient ischemic attack) 2023   a.) on DAPT therapy per neurology recommendations; started 10/2021   Type 2 diabetes, uncontrolled, with neuropathy 10/15/2015   Overview:  Microalbumin 7.7 12/2010: Hgb A1c 10.2% 12/2010 and 9.7% 03/2011, eye exam 10/2009 [facility] DM clinic    Urinary incontinence    Vertigo    Vitamin B12 deficiency    Vitamin D deficiency    Past Surgical History:  Procedure Laterality Date   ABDOMINAL HYSTERECTOMY     Partial   bladder stim  2007   BLADDER SURGERY     x 2. tacking of bladder and the other was the stimulator placement   CHOLECYSTECTOMY     COLONOSCOPY WITH PROPOFOL N/A 03/04/2019   Procedure: COLONOSCOPY WITH PROPOFOL;  Surgeon: Midge Minium, MD;  Location: [facility] ENDOSCOPY;  Service: Endoscopy;  Laterality: N/A;    ESOPHAGOGASTRODUODENOSCOPY (EGD) WITH PROPOFOL N/A 03/04/2019   Procedure: ESOPHAGOGASTRODUODENOSCOPY (EGD) WITH PROPOFOL;  Surgeon: Midge Minium, MD;  Location: [facility] ENDOSCOPY;  Service: Endoscopy;  Laterality: N/A;   INCISION AND DRAINAGE ABSCESS Right 04/16/2019   Procedure: INCISION AND DRAINAGE ABSCESS;  Surgeon: Sung Amabile, DO;  Location: [facility] ORS;  Service: General;  Laterality: Right;   INTERSTIM-GENERATOR CHANGE N/A 12/31/2017   Procedure: INTERSTIM-GENERATOR CHANGE;  Surgeon: Alfredo Martinez, MD;  Location: [facility] ORS;  Service: Urology;  Laterality: N/A;   LEFT HEART CATH AND CORONARY ANGIOGRAPHY Left 04/13/2011   Procedure: LEFT HEART CATH AND CORONARY ANGIOGRAPHY; Location: [facility]; Surgeon: Arnoldo Hooker, MD   LIVER BIOPSY  11/2014   Negative   LUNG BIOPSY  2015   small spot on lung   TUBAL LIGATION     UPPER GI ENDOSCOPY     WRIST SURGERY Right 11/02/2019   Family History  Problem Relation  Age of Onset   Diabetes Mother    Heart disease Mother    Hyperlipidemia Mother    Hypertension Mother    Kidney disease Mother    Mental illness Mother    Hypothyroidism Mother    Stroke Mother    Osteoporosis Mother    Glaucoma Mother    Congestive Heart Failure Mother    Hypertension Father    Kidney disease Brother    Intracerebral hemorrhage Brother    Breast cancer Maternal Grandmother 60   Heart disease Maternal Grandfather    Rheum arthritis Maternal Grandfather    Cancer Maternal Grandfather        liver   Kidney disease Maternal Grandfather    Cancer Paternal Grandmother        lung   Asthma Daughter    Cancer Daughter        throat   Bipolar disorder Daughter    Bipolar disorder Daughter    Hypertension Daughter    Restless legs syndrome Daughter    Social History   Tobacco Use   Smoking status: Never   Smokeless tobacco: Never  Vaping Use   Vaping status: Never Used  Substance Use Topics   Alcohol use: No   Drug use: No    Pertinent Clinical  Results:  LABS:   Lab Results  Component Value Date   WBC 5.9 06/23/2023   HGB 11.3 (L) 06/23/2023   HCT 36.7 06/23/2023   MCV 94.1 06/23/2023   PLT 241 06/23/2023   Lab Results  Component Value Date   NA 138 06/23/2023   K 5.0 06/23/2023   CO2 21 (L) 06/23/2023   GLUCOSE 195 (H) 06/23/2023   BUN 37 (H) 06/23/2023   CREATININE 1.32 (H) 06/23/2023   CALCIUM 9.3 06/23/2023   EGFR 46 (L) 05/09/2023   GFRNONAA 47 (L) 06/23/2023   Lab Results  Component Value Date   HGBA1C 10.4 (H) 05/09/2023      Component Ref Range & Units 06/15/2023  Fructosamine 205 - 285 umol/L 226  Resulting Agency [facility]ICH[facility]ILL[facility]      ECG: Date: 05/11/2023 Time ECG obtained: 0800 AM Rate: 82 bpm Rhythm: normal sinus Axis (leads I and aVF): Left axis deviation Intervals: PR 174 ms. QRS 96 ms. QTc 464 ms. ST segment and T wave changes: No evidence of acute ST segment elevation or depression.  Comparison: Similar to previous tracing obtained on 03/27/2023   IMAGING / PROCEDURES: CT RENAL STONE STUDY performed on 02/05/2023 No acute findings in the abdomen or pelvis, no change from prior exam. No renal stones or obstructive uropathy. Moderate volume of formed stool throughout the colon, can be seen with constipation. Given patient reports diarrhea, this may be related to overflow. Hepatomegaly and hepatic steatosis. Aortic atherosclerosis  ECHOCARDIOGRAM COMPLETE BUBBLE STUDY performed on 02/12/2022 Left ventricular ejection fraction, by estimation, is 55 to 60%. The left ventricle has normal function. The left ventricle has no regional wall motion abnormalities. There is mild left ventricular hypertrophy. Left ventricular diastolic parameters are consistent with Grade I diastolic dysfunction (impaired relaxation).  Right ventricular systolic function is normal. The right ventricular size is normal. Tricuspid regurgitation signal is inadequate for assessing PA pressure.  The  mitral valve is normal in structure. No evidence of mitral valve regurgitation. No evidence of mitral stenosis.  The aortic valve is normal in structure. Aortic valve regurgitation is not visualized. No aortic stenosis is present.  The inferior vena cava is normal in size with  greater than 50% respiratory variability, suggesting right atrial pressure of 3 mmHg.  Agitated saline contrast bubble study was negative, with no evidence of any interatrial shunt.   LONG TERM CARDIAC EVENT MONITOR STUDY performed on 11/24/2021 Patch Wear Time:  16 days and 23 hours (2023-02-21T12:06:28-0500 to 2023-03-15T06:10:19-0400) Monitor 1 Normal sinus rhythm Patient had a min HR of 56 bpm, max HR of 127 bpm, and avg HR of 85 bpm.  Isolated SVEs were rare (<1.0%), and no SVE Couplets or SVE Triplets were present.  No Isolated VEs, VE Couplets, or VE Triplets were present.  No patient triggered events Monitor 2 Normal sinus rhythm Patient had a min HR of 55 bpm, max HR of 124 bpm, and avg HR of 86 bpm.  Isolated SVEs were rare (<1.0%), and no SVE Couplets or SVE Triplets were present. Isolated VEs were rare (<1.0%), and no VE Couplets or VE Triplets were present.  No patient triggered events  MYOCARDIAL PERFUSION IMAGING STUDY (LEXISCAN) performed on 10/29/2020 Pharmacological myocardial perfusion imaging study with no significant  ischemia Normal wall motion EF estimated at 65% No EKG changes concerning for ischemia at peak stress or in recovery. CT attenuation correction images with mild aortic atherosclerosis, moderate proximal to mid LAD disease. Low risk scan   Impression and Plan:  [name] has been referred for pre-anesthesia review and clearance prior to her undergoing the planned anesthetic and procedural courses. Available labs, pertinent testing, and imaging results were personally reviewed by me in preparation for upcoming operative/procedural course. [facility] medical record has been  updated following extensive record review and patient interview with PAT staff.   Patient has been appropriately cleared by cardiology and internal medicine with overall ACCEPTABLE risk of patient experiencing significant perioperative complications. Based on clinical review performed today (07/12/23), barring any significant acute changes in the patient's overall condition, it is anticipated that she will be able to proceed with the planned surgical intervention. Her uncontrolled diabetes places her at increased risk of perioperative complications. Risks will need to be discussed with patient by attending surgeon should the decision be made to proceed as planned. Patient is aware that any acute changes in clinical condition may necessitate her procedure being postponed and/or cancelled. Patient will meet with anesthesia team (MD and/or CRNA) on the day of her procedure for preoperative evaluation/assessment. Questions regarding anesthetic course will be fielded at that time.   Pre-surgical instructions were reviewed with the patient during her PAT appointment, and questions were fielded to satisfaction by PAT clinical staff. She has been instructed on which medications that she will need to hold prior to surgery, as well as the ones that have been deemed safe/appropriate to take on the day of her procedure. As part of the general education provided by PAT, patient made aware both verbally and in writing, that she would need to abstain from the use of any illegal substances during her perioperative course.  She was advised that failure to follow the provided instructions could necessitate case cancellation or result in serious perioperative complications up to and including death. Patient encouraged to contact PAT and/or her surgeon's office to discuss any questions or concerns that may arise prior to surgery; verbalized understanding.   Quentin Mulling, MSN, APRN, FNP-C, CEN [facility]   Perioperative Services Nurse Practitioner Phone: [phone] Fax: [phone] 07/12/23 9:36 AM  NOTE: This note has been prepared using Dragon dictation software. Despite my best ability to proofread, there is always the  potential that unintentional transcriptional errors may still occur from this process.

## 2023-07-15 MED ORDER — VANCOMYCIN HCL IN DEXTROSE 1-5 GM/200ML-% IV SOLN
1000.0000 mg | INTRAVENOUS | Status: AC
  Administered 2023-07-16: 1000 mg via INTRAVENOUS

## 2023-07-15 MED ORDER — SODIUM CHLORIDE 0.9 % IV SOLN: INTRAVENOUS | Status: DC

## 2023-07-15 MED ORDER — GENTAMICIN SULFATE 40 MG/ML IJ SOLN
5.0000 mg/kg | INTRAVENOUS | Status: AC
  Administered 2023-07-16: 400 mg via INTRAVENOUS
  Filled 2023-07-15: qty 10

## 2023-07-15 MED ORDER — ORAL CARE MOUTH RINSE: 15.0000 mL | Freq: Once | OROMUCOSAL | Status: AC

## 2023-07-15 MED ORDER — CHLORHEXIDINE GLUCONATE 0.12 % MT SOLN
15.0000 mL | Freq: Once | OROMUCOSAL | Status: AC
  Administered 2023-07-16: 15 mL via OROMUCOSAL

## 2023-07-16 ENCOUNTER — Ambulatory Visit: Payer: Medicare Other | Admitting: Urgent Care

## 2023-07-16 ENCOUNTER — Encounter: Payer: Self-pay | Admitting: Urology

## 2023-07-16 ENCOUNTER — Ambulatory Visit
Admission: RE | Admit: 2023-07-16 | Discharge: 2023-07-16 | Disposition: A | Discharge status: Home / Self Care | Payer: Medicare Other | Attending: Urology | Admitting: Urology

## 2023-07-16 ENCOUNTER — Ambulatory Visit: Payer: Medicare Other

## 2023-07-16 ENCOUNTER — Encounter
Admission: RE | Disposition: A | Discharge status: Home / Self Care | Payer: Self-pay | Source: Home / Self Care | Attending: Urology

## 2023-07-16 ENCOUNTER — Other Ambulatory Visit: Payer: Self-pay

## 2023-07-16 DIAGNOSIS — E785 Hyperlipidemia, unspecified: Secondary | ICD-10-CM | POA: Insufficient documentation

## 2023-07-16 DIAGNOSIS — D631 Anemia in chronic kidney disease: Secondary | ICD-10-CM | POA: Insufficient documentation

## 2023-07-16 DIAGNOSIS — Z7902 Long term (current) use of antithrombotics/antiplatelets: Secondary | ICD-10-CM | POA: Insufficient documentation

## 2023-07-16 DIAGNOSIS — N183 Chronic kidney disease, stage 3 unspecified: Secondary | ICD-10-CM | POA: Diagnosis not present

## 2023-07-16 DIAGNOSIS — Z794 Long term (current) use of insulin: Secondary | ICD-10-CM

## 2023-07-16 DIAGNOSIS — M1990 Unspecified osteoarthritis, unspecified site: Secondary | ICD-10-CM | POA: Diagnosis not present

## 2023-07-16 DIAGNOSIS — Z9981 Dependence on supplemental oxygen: Secondary | ICD-10-CM | POA: Diagnosis not present

## 2023-07-16 DIAGNOSIS — E039 Hypothyroidism, unspecified: Secondary | ICD-10-CM | POA: Insufficient documentation

## 2023-07-16 DIAGNOSIS — I509 Heart failure, unspecified: Secondary | ICD-10-CM | POA: Diagnosis not present

## 2023-07-16 DIAGNOSIS — F319 Bipolar disorder, unspecified: Secondary | ICD-10-CM | POA: Insufficient documentation

## 2023-07-16 DIAGNOSIS — E1151 Type 2 diabetes mellitus with diabetic peripheral angiopathy without gangrene: Secondary | ICD-10-CM | POA: Diagnosis not present

## 2023-07-16 DIAGNOSIS — I5032 Chronic diastolic (congestive) heart failure: Secondary | ICD-10-CM | POA: Diagnosis not present

## 2023-07-16 DIAGNOSIS — I2510 Atherosclerotic heart disease of native coronary artery without angina pectoris: Secondary | ICD-10-CM | POA: Insufficient documentation

## 2023-07-16 DIAGNOSIS — G4733 Obstructive sleep apnea (adult) (pediatric): Secondary | ICD-10-CM | POA: Diagnosis not present

## 2023-07-16 DIAGNOSIS — T83110A Breakdown (mechanical) of urinary electronic stimulator device, initial encounter: Secondary | ICD-10-CM | POA: Diagnosis not present

## 2023-07-16 DIAGNOSIS — J449 Chronic obstructive pulmonary disease, unspecified: Secondary | ICD-10-CM | POA: Diagnosis not present

## 2023-07-16 DIAGNOSIS — K219 Gastro-esophageal reflux disease without esophagitis: Secondary | ICD-10-CM | POA: Insufficient documentation

## 2023-07-16 DIAGNOSIS — X58XXXA Exposure to other specified factors, initial encounter: Secondary | ICD-10-CM | POA: Insufficient documentation

## 2023-07-16 DIAGNOSIS — G2581 Restless legs syndrome: Secondary | ICD-10-CM | POA: Diagnosis not present

## 2023-07-16 DIAGNOSIS — Z7982 Long term (current) use of aspirin: Secondary | ICD-10-CM | POA: Insufficient documentation

## 2023-07-16 DIAGNOSIS — Z833 Family history of diabetes mellitus: Secondary | ICD-10-CM | POA: Insufficient documentation

## 2023-07-16 DIAGNOSIS — N3946 Mixed incontinence: Secondary | ICD-10-CM | POA: Diagnosis not present

## 2023-07-16 DIAGNOSIS — Z7984 Long term (current) use of oral hypoglycemic drugs: Secondary | ICD-10-CM | POA: Insufficient documentation

## 2023-07-16 DIAGNOSIS — I25118 Atherosclerotic heart disease of native coronary artery with other forms of angina pectoris: Secondary | ICD-10-CM | POA: Diagnosis not present

## 2023-07-16 DIAGNOSIS — E1140 Type 2 diabetes mellitus with diabetic neuropathy, unspecified: Secondary | ICD-10-CM | POA: Insufficient documentation

## 2023-07-16 DIAGNOSIS — Z0189 Encounter for other specified special examinations: Secondary | ICD-10-CM | POA: Diagnosis not present

## 2023-07-16 DIAGNOSIS — I130 Hypertensive heart and chronic kidney disease with heart failure and stage 1 through stage 4 chronic kidney disease, or unspecified chronic kidney disease: Secondary | ICD-10-CM | POA: Diagnosis not present

## 2023-07-16 DIAGNOSIS — E1122 Type 2 diabetes mellitus with diabetic chronic kidney disease: Secondary | ICD-10-CM | POA: Diagnosis not present

## 2023-07-16 DIAGNOSIS — T85199A Other mechanical complication of other implanted electronic stimulator of nervous system, initial encounter: Secondary | ICD-10-CM | POA: Diagnosis not present

## 2023-07-16 DIAGNOSIS — Z01812 Encounter for preprocedural laboratory examination: Secondary | ICD-10-CM

## 2023-07-16 DIAGNOSIS — Z8673 Personal history of transient ischemic attack (TIA), and cerebral infarction without residual deficits: Secondary | ICD-10-CM | POA: Insufficient documentation

## 2023-07-16 DIAGNOSIS — Z6838 Body mass index (BMI) 38.0-38.9, adult: Secondary | ICD-10-CM | POA: Diagnosis not present

## 2023-07-16 DIAGNOSIS — N3941 Urge incontinence: Secondary | ICD-10-CM

## 2023-07-16 DIAGNOSIS — Z7901 Long term (current) use of anticoagulants: Secondary | ICD-10-CM | POA: Insufficient documentation

## 2023-07-16 HISTORY — PX: INTERSTIM IMPLANT PLACEMENT: SHX5130

## 2023-07-16 HISTORY — PX: INTERSTIM IMPLANT REMOVAL: SHX5131

## 2023-07-16 HISTORY — DX: Other cerebrovascular disease: I67.89

## 2023-07-16 LAB — GLUCOSE, CAPILLARY
Glucose-Capillary: 254 mg/dL — ABNORMAL HIGH (ref 70–99)
Glucose-Capillary: 237 mg/dL — ABNORMAL HIGH (ref 70–99)

## 2023-07-16 SURGERY — REMOVAL, NEUROSTIMULATOR, SACRAL
Anesthesia: General

## 2023-07-16 MED ORDER — PROPOFOL 1000 MG/100ML IV EMUL
INTRAVENOUS | Status: AC
  Filled 2023-07-16: qty 100

## 2023-07-16 MED ORDER — OXYCODONE HCL 5 MG/5ML PO SOLN: 5.0000 mg | Freq: Once | ORAL | Status: DC | PRN

## 2023-07-16 MED ORDER — BUPIVACAINE HCL (PF) 0.5 % IJ SOLN
INTRAMUSCULAR | Status: AC
  Filled 2023-07-16: qty 60

## 2023-07-16 MED ORDER — PHENYLEPHRINE 80 MCG/ML (10ML) SYRINGE FOR IV PUSH (FOR BLOOD PRESSURE SUPPORT)
PREFILLED_SYRINGE | INTRAVENOUS | Status: DC | PRN
  Administered 2023-07-16: 160 ug via INTRAVENOUS
  Administered 2023-07-16: 80 ug via INTRAVENOUS
  Administered 2023-07-16 (×3): 160 ug via INTRAVENOUS

## 2023-07-16 MED ORDER — DEXAMETHASONE SODIUM PHOSPHATE 10 MG/ML IJ SOLN
INTRAMUSCULAR | Status: AC
  Filled 2023-07-16: qty 1

## 2023-07-16 MED ORDER — LIDOCAINE HCL (CARDIAC) PF 100 MG/5ML IV SOSY
PREFILLED_SYRINGE | INTRAVENOUS | Status: DC | PRN
  Administered 2023-07-16: 50 mg via INTRAVENOUS

## 2023-07-16 MED ORDER — ONDANSETRON HCL 4 MG/2ML IJ SOLN
INTRAMUSCULAR | Status: DC | PRN
  Administered 2023-07-16: 4 mg via INTRAVENOUS

## 2023-07-16 MED ORDER — ONDANSETRON HCL 4 MG/2ML IJ SOLN
INTRAMUSCULAR | Status: AC
  Filled 2023-07-16: qty 2

## 2023-07-16 MED ORDER — DEXAMETHASONE SODIUM PHOSPHATE 10 MG/ML IJ SOLN
INTRAMUSCULAR | Status: DC | PRN
  Administered 2023-07-16: 10 mg via INTRAVENOUS

## 2023-07-16 MED ORDER — OXYCODONE-ACETAMINOPHEN 5-325 MG PO TABS
1.0000 | ORAL_TABLET | Freq: Three times a day (TID) | ORAL | 0 refills | Status: DC | PRN
Start: 2023-07-16 — End: 2023-10-01

## 2023-07-16 MED ORDER — LIDOCAINE HCL (PF) 2 % IJ SOLN
INTRAMUSCULAR | Status: AC
  Filled 2023-07-16: qty 5

## 2023-07-16 MED ORDER — FENTANYL CITRATE (PF) 100 MCG/2ML IJ SOLN: 25.0000 ug | INTRAMUSCULAR | Status: DC | PRN

## 2023-07-16 MED ORDER — MIDAZOLAM HCL 2 MG/2ML IJ SOLN
INTRAMUSCULAR | Status: AC
Start: 2023-07-16 — End: ?
  Filled 2023-07-16: qty 2

## 2023-07-16 MED ORDER — BUPIVACAINE HCL (PF) 0.5 % IJ SOLN
INTRAMUSCULAR | Status: AC
  Filled 2023-07-16: qty 30

## 2023-07-16 MED ORDER — CIPROFLOXACIN HCL 250 MG PO TABS: 250.0000 mg | ORAL_TABLET | Freq: Two times a day (BID) | ORAL | 0 refills | Status: AC

## 2023-07-16 MED ORDER — 0.9 % SODIUM CHLORIDE (POUR BTL) OPTIME
TOPICAL | Status: DC | PRN
  Administered 2023-07-16: 500 mL

## 2023-07-16 MED ORDER — PHENYLEPHRINE 80 MCG/ML (10ML) SYRINGE FOR IV PUSH (FOR BLOOD PRESSURE SUPPORT)
PREFILLED_SYRINGE | INTRAVENOUS | Status: AC
  Filled 2023-07-16: qty 10

## 2023-07-16 MED ORDER — BUPIVACAINE-EPINEPHRINE 0.5% -1:200000 IJ SOLN
INTRAMUSCULAR | Status: DC | PRN
  Administered 2023-07-16: 4 mL
  Administered 2023-07-16: 7.5 mL
  Administered 2023-07-16: 1.5 mL

## 2023-07-16 MED ORDER — PROPOFOL 10 MG/ML IV BOLUS
INTRAVENOUS | Status: DC | PRN
  Administered 2023-07-16: 200 mg via INTRAVENOUS
  Administered 2023-07-16: 150 ug/kg/min via INTRAVENOUS

## 2023-07-16 MED ORDER — CHLORHEXIDINE GLUCONATE 0.12 % MT SOLN
OROMUCOSAL | Status: AC
  Filled 2023-07-16: qty 15

## 2023-07-16 MED ORDER — LIDOCAINE-EPINEPHRINE (PF) 1 %-1:200000 IJ SOLN
INTRAMUSCULAR | Status: AC
  Filled 2023-07-16: qty 30

## 2023-07-16 MED ORDER — FENTANYL CITRATE (PF) 100 MCG/2ML IJ SOLN
INTRAMUSCULAR | Status: AC
  Filled 2023-07-16: qty 2

## 2023-07-16 MED ORDER — PHENYLEPHRINE HCL-NACL 20-0.9 MG/250ML-% IV SOLN
INTRAVENOUS | Status: DC | PRN
  Administered 2023-07-16: 40 ug/min via INTRAVENOUS

## 2023-07-16 MED ORDER — GLYCOPYRROLATE 0.2 MG/ML IJ SOLN
INTRAMUSCULAR | Status: DC | PRN
  Administered 2023-07-16: .2 mg via INTRAVENOUS

## 2023-07-16 MED ORDER — GLYCOPYRROLATE 0.2 MG/ML IJ SOLN
INTRAMUSCULAR | Status: AC
  Filled 2023-07-16: qty 1

## 2023-07-16 MED ORDER — LIDOCAINE-EPINEPHRINE (PF) 1 %-1:200000 IJ SOLN
INTRAMUSCULAR | Status: DC | PRN
  Administered 2023-07-16: 4 mL
  Administered 2023-07-16: 1.5 mL
  Administered 2023-07-16: 7.5 mL

## 2023-07-16 MED ORDER — KETAMINE HCL 50 MG/5ML IJ SOSY
PREFILLED_SYRINGE | INTRAMUSCULAR | Status: AC
  Filled 2023-07-16: qty 5

## 2023-07-16 MED ORDER — VANCOMYCIN HCL IN DEXTROSE 1-5 GM/200ML-% IV SOLN
INTRAVENOUS | Status: AC
  Filled 2023-07-16: qty 200

## 2023-07-16 MED ORDER — EPINEPHRINE PF 1 MG/ML IJ SOLN
INTRAMUSCULAR | Status: AC
  Filled 2023-07-16: qty 1

## 2023-07-16 MED ORDER — OXYCODONE HCL 5 MG PO TABS: 5.0000 mg | ORAL_TABLET | Freq: Once | ORAL | Status: DC | PRN

## 2023-07-16 MED ORDER — FENTANYL CITRATE (PF) 100 MCG/2ML IJ SOLN
INTRAMUSCULAR | Status: DC | PRN
  Administered 2023-07-16 (×2): 50 ug via INTRAVENOUS

## 2023-07-16 MED ORDER — MIDAZOLAM HCL 2 MG/2ML IJ SOLN
INTRAMUSCULAR | Status: DC | PRN
  Administered 2023-07-16: 2 mg via INTRAVENOUS

## 2023-07-16 MED ORDER — SUCCINYLCHOLINE CHLORIDE 200 MG/10ML IV SOSY
PREFILLED_SYRINGE | INTRAVENOUS | Status: DC | PRN
  Administered 2023-07-16: 100 mg via INTRAVENOUS

## 2023-07-16 SURGICAL SUPPLY — 60 items
ADH SKN CLS APL DERMABOND .7 (GAUZE/BANDAGES/DRESSINGS) ×1
APL PRP STRL LF DISP 70% ISPRP (MISCELLANEOUS) ×2
APL SKNCLS STERI-STRIP NONHPOA (GAUZE/BANDAGES/DRESSINGS)
BENZOIN TINCTURE PRP APPL 2/3 (GAUZE/BANDAGES/DRESSINGS) IMPLANT
BLADE SURG 15 STRL LF DISP TIS (BLADE) ×1 IMPLANT
BLADE SURG 15 STRL SS (BLADE) ×1
CHLORAPREP W/TINT 26 (MISCELLANEOUS) ×1 IMPLANT
COVER MAYO STAND STRL (DRAPES) ×1 IMPLANT
COVER PROBE FLX POLY STRL (MISCELLANEOUS) ×1 IMPLANT
DERMABOND ADVANCED .7 DNX12 (GAUZE/BANDAGES/DRESSINGS) ×1 IMPLANT
DRAPE C-ARM 42X72 X-RAY (DRAPES) ×2 IMPLANT
DRAPE C-ARM XRAY 36X54 (DRAPES) ×1 IMPLANT
DRAPE C-ARMOR (DRAPES) ×1 IMPLANT
DRAPE INCISE 23X17 STRL (DRAPES) ×1 IMPLANT
DRAPE INCISE IOBAN 23X17 STRL (DRAPES) ×1
DRAPE INCISE IOBAN 66X45 STRL (DRAPES) ×1 IMPLANT
DRAPE LAPAROTOMY TRNSV 106X77 (MISCELLANEOUS) ×1 IMPLANT
DRAPE SHEET LG 3/4 BI-LAMINATE (DRAPES) ×1 IMPLANT
DRAPE TABLE BACK 80X90 (DRAPES) ×1 IMPLANT
DRSG TEGADERM 2-3/8X2-3/4 SM (GAUZE/BANDAGES/DRESSINGS) ×1 IMPLANT
DRSG TEGADERM 4X4.75 (GAUZE/BANDAGES/DRESSINGS) ×1 IMPLANT
DRSG TELFA 3X8 NADH STRL (GAUZE/BANDAGES/DRESSINGS) ×1 IMPLANT
ELECT REM PT RETURN 9FT ADLT (ELECTROSURGICAL) ×1
ELECTRODE REM PT RTRN 9FT ADLT (ELECTROSURGICAL) ×1 IMPLANT
GAUZE 4X4 16PLY ~~LOC~~+RFID DBL (SPONGE) ×1 IMPLANT
GAUZE SPONGE 4X4 12PLY STRL (GAUZE/BANDAGES/DRESSINGS) IMPLANT
GLOVE BIO SURGEON STRL SZ7.5 (GLOVE) ×1 IMPLANT
GOWN STRL REUS W/ TWL XL LVL3 (GOWN DISPOSABLE) ×1 IMPLANT
GOWN STRL REUS W/TWL XL LVL3 (GOWN DISPOSABLE) ×1
KIT HANDSET INTERSTIM COMM (NEUROSURGERY SUPPLIES) IMPLANT
KIT TURNOVER CYSTO (KITS) ×1 IMPLANT
LEAD INTERSTIM 4.32 28 L (Lead) IMPLANT
MANIFOLD NEPTUNE II (INSTRUMENTS) ×1 IMPLANT
NDL HYPO 22X1.5 SAFETY MO (MISCELLANEOUS) ×1 IMPLANT
NEEDLE HYPO 22X1.5 SAFETY MO (MISCELLANEOUS) ×1
NEUROSTIMULATOR 1.7X2X.06 (UROLOGICAL SUPPLIES) IMPLANT
NS IRRIG 500ML POUR BTL (IV SOLUTION) ×1 IMPLANT
PACK BASIN MINOR ARMC (MISCELLANEOUS) ×1 IMPLANT
STAPLER SKIN PROX 35W (STAPLE) IMPLANT
STIMULATOR INTERSTIM 2X1.7X.3 (Miscellaneous) ×1 IMPLANT
STRIP CLOSURE SKIN 1/2X4 (GAUZE/BANDAGES/DRESSINGS) ×1 IMPLANT
SUCTION TUBE FRAZIER 10FR DISP (SUCTIONS) ×1 IMPLANT
SUT MNCRL 4-0 (SUTURE) ×1
SUT MNCRL 4-0 27XMFL (SUTURE) ×1
SUT SILK 2 0 (SUTURE)
SUT SILK 2-0 18XBRD TIE 12 (SUTURE) IMPLANT
SUT VIC AB 3-0 SH 27 (SUTURE) ×2
SUT VIC AB 3-0 SH 27X BRD (SUTURE) ×2 IMPLANT
SUT VIC AB 4-0 PS2 18 (SUTURE) ×1 IMPLANT
SUT VIC AB 4-0 RB1 27 (SUTURE) ×3
SUT VIC AB 4-0 RB1 27X BRD (SUTURE) ×3 IMPLANT
SUTURE MNCRL 4-0 27XMF (SUTURE) ×1 IMPLANT
SYR 10ML LL (SYRINGE) ×2 IMPLANT
SYR BULB IRRIG 60ML STRL (SYRINGE) ×1 IMPLANT
SYR CONTROL 10ML LL (SYRINGE) IMPLANT
TOWEL OR 17X26 4PK STRL BLUE (TOWEL DISPOSABLE) ×2 IMPLANT
TRAP FLUID SMOKE EVACUATOR (MISCELLANEOUS) ×1 IMPLANT
TUBING CONNECTING 10 (TUBING) IMPLANT
WATER STERILE IRR 1000ML POUR (IV SOLUTION) ×1 IMPLANT
WATER STERILE IRR 500ML POUR (IV SOLUTION) ×1 IMPLANT

## 2023-07-16 NOTE — Interval H&P Note (Signed)
History and Physical Interval Note:  07/16/2023 11:58 AM  [name]  has presented today for surgery, with the diagnosis of Malfunctioning Interstim, Refractory Urge Incontinence.  The various methods of treatment have been discussed with the patient and family. After consideration of risks, benefits and other options for treatment, the patient has consented to  Procedure(s): REMOVAL OF INTERSTIM IMPLANT (N/A) INTERSTIM IMPLANT FIRST STAGE WITH IMPEDANCE CHECK (N/A) INTERSTIM IMPLANT SECOND STAGE (N/A) as a surgical intervention.  [name] history has been reviewed, patient examined, no change in status, stable for surgery.  I have reviewed the patient's chart and labs.  Questions were answered to the patient's satisfaction.     Lataunya Ruud A Lonita Debes

## 2023-07-16 NOTE — Op Note (Signed)
Preoperative diagnosis: Malfunctioning InterStim and refractory urgency incontinence Postoperative diagnosis malfunctioning InterStim and refractory urgency incontinence Surgery: Removal of InterStim and stage I and stage II implantation of InterStim and impedance check Surgeon: Dr. Lorin Picket Alta Shober  The patient has the above diagnosis and consented the above procedure.  Extra care was taken the positioning.  It was easy to palpate the IPG and I could see a small dimpling scar to the right of the midline.  Fluoroscopy was also used to identify device  I used 10 cc of lidocaine epinephrine mixture after marking the right upper buttock incision.  I dissected down through skin and subcutaneous tissue and opened the pseudocapsule widely.  It was opened widely for subsequent replacement.  IPG was removed.  Hemostat was placed across the lead and lead was cut.  By pulling on the lead I could see dimpling of the skin.  I marked the lead position with fluoroscopy.  It was quite high away from the leads.  I made a 2 and half centimeter incision.  I dissected down quite deeply.  I could find the lead easily and bring it from lateral to medial.  Using appropriate retractors and finger dissection and sharp dissection I dissected down close to the bone table.  Under fluoroscopic guidance I the hemostat along the length of the lead removing it nicely in a cephalad or operative direction.  It was removed in total  I marked the S3 foramina bilaterally with a 3.5 inch frame needles.  1 cc of lidocaine epinephrine mixture utilized.  I thought the placement would go much better if I made a separate incision.  I easily on the S3 foramina on the right.  I have changed different angles and it was very medial.  She had tremendous bellow response but no toe response.  The inner aspect of the foraminal needle was removed and guide was placed to the appropriate depth.  Scalpel incision was made.  White trocar was placed the  appropriate depth.  Well-prepared coud shaped lead was placed to the appropriate depth and the looked in excellent position.  She had excellent bellows response at all 4 leads settings at low amplitude  Under fluoroscopic guidance I removed the white trocar and inner aspect of the lead.  X-rays photos were taking the lateral and AP views  I passed the lead at the appropriate depth from medial to lateral with a passer.  The lead was connected to the IPG.  Screwdriver was utilized.  After developing a little bit more of a flap in the upper aspect of the incision the IPG laid in nicely.  I did a impedance check was normal in all 4 settings  I closed the right buttock incision with running 3-0 Vicryl subcutaneous followed by 4-0 Monocryl subcuticular.  I closed the longer midline incision with 2 interrupted 3-0 Vicryl subcutaneous followed by 4-0 Vicryl interrupted skin sutures.  I closed the short 1 cm incision for the actual implant with 2 interrupted 4-0 Vicryl sutures.  My usual sterile dressing was applied.  Hopefully the patient will reach her treatment goal

## 2023-07-16 NOTE — H&P (Signed)
When I saw the patient in February 2017 the interstim device x-rays looked in good position.  It was interrogated and the impedance check was normal.  She turned the device up she felt it vaginally.  She had good battery life then.  She recently saw Carollee Herter and had a KUB November 27, 2017.  Impedance check was done and bladder was found to be dead.  She has been having some vaginal discomfort apparently with intercourse and has had a previous bladder suspension.   The patient thinks her InterStim was working beautifully until several weeks ago.  She reported being almost completely dry.  Now she has urge incontinence and milder stress incontinence.  She does not wear pads because she says they irritate her.  She changes her close several times.  She ranks the incontinence is milder.  She is now getting up 4 times at night and set up twice.  She can void every 15-20 minutes and cannot hold it for 2 hours.  She feels discomfort or pressure over the IPG.  She is allergic to amoxicillin   December 31, 2017 the patient had the IPG change.  All 4-lead positions looked excellent with good Bellows and toe responses at very low amplitude.   We can see dramatically better.  Improved urge incontinence.  She still has mild stress incontinence and I educated her regarding this.  Incisions look normal.  Some basic teaching was provided by our nurses.     Patient had called in leaking and was trouble shot by Maralyn Sago on Jan 21, 2018. e By history I believe that this did help significantly but in the last 6 weeks she started to leak more again.  She can leak sitting not associated with awareness.  She has moderate bedwetting.  She has urge incontinence.  She can soak a pad when she goes out in public.  At home she changes her close.  She does not have cystitis symptoms but urine sent for culture.  She does not change the settings whatsoever   She did not feel it rectally and vaginally at 1.1 but did at 1.5 and was left at that  amplitude.  Again there was a lot of time needed to connect to the device for unknown reasons.  This happened last time as well.  Maralyn Sago will see her in 2 weeks and make further adjustments.  We do not think the patient is able to do this on her own.  She was told to improve her diet since she had glucose in the urine.  Urine cultures positive     Today I have not seen the patient since August 2019.  The patient said 2 or 3 months ago she was wearing 1 pad a day especially when she went out in public.  Now she wears 3 pads a day that are quite wet.  She leaks with coughing sneezing and with urgency.  She can leak without awareness.  She has bedwetting.  She will wake up and also leak before she gets to the bedside commode  She is voiding every 2-3 hours gets up 3 times a night.  When she turns up the amplitude she feels it vaginally.  She does not know how to change programs  Clinically not infected     due to staffing issues we will need to have the [employer] rep come in and troubleshoot the device. We will proceed accordingly. Call if urine culture is positive    Today Patient reports in  the last 1 year she has been having urge incontinence soaking 3 pads a day.  She has bedwetting.  She is a bedside commode.  She voids every 1 hour and cannot hold it for 2 hours.  She gets up twice a night.  She said she was hospitalized once with sepsis.  1 out of the last for 5 urine cultures in the healthcare system record was positive and the rest were negative.  She is not on any medication for overactive bladder.  She had a new battery in 2019.  The device was placed in 2017 earlier.  No cystitis symptoms.  When her and her husband change programs or increased amplitude she does not feel anything   Reviewed a note from 2017.  She had actually had InterStim placed in 2011 by Dr. Barnabas Lister.  She was having urge incontinence and bedwetting wearing 4-5 pads a day.  She also had some stress incontinence.  When I  saw her back in 2017 the battery had expired.       PMH:     Past Medical History:  Diagnosis Date   Anxiety     Arthritis     Asthma     Bipolar depression (HCC)     CHF (congestive heart failure) (HCC)     Chronic headaches      migraines have lessened   Chronic kidney disease      ddstage 111   COPD (chronic obstructive pulmonary disease) (HCC)     Coronary artery disease     Depression     Diabetic neuropathy (HCC)     Dyspnea      uses inhalers for this   Dysrhythmia      rapid   Fatty liver     GERD (gastroesophageal reflux disease)     Headache     Heart attack (HCC) 2009   Hyperlipidemia     Hypertension     Hypomagnesemia     Hypothyroid     Irregular heart rhythm     Liver lesion      Favored to be benign on CT; MRI of abd w/contract in Aug 2016. Liver Biopsy-Negative, March 2016   Morbid obesity (HCC)     OSA (obstructive sleep apnea)     Peripheral vascular disease (HCC)     Pinched nerve      Back   Pneumonia     RLS (restless legs syndrome)     Seasonal allergies     Sleep apnea      uses oxygen at night 2L d/t cpap did not fit   Type 2 diabetes, uncontrolled, with neuropathy 10/15/2015    Overview:  Microalbumin 7.7 12/2010: Hgb A1c 10.2% 12/2010 and 9.7% 03/2011, eye exam 10/2009 [facility]    Urinary incontinence     Vitamin D deficiency            Surgical History:      Past Surgical History:  Procedure Laterality Date   ABDOMINAL HYSTERECTOMY        Partial   bladder stim   2007   BLADDER SURGERY        x 2. tacking of bladder and the other was the stimulator placement   CHOLECYSTECTOMY       COLONOSCOPY WITH PROPOFOL N/A 03/04/2019    Procedure: COLONOSCOPY WITH PROPOFOL;  Surgeon: Midge Minium, MD;  Location: [facility] ENDOSCOPY;  Service: Endoscopy;  Laterality: N/A;   ESOPHAGOGASTRODUODENOSCOPY (EGD) WITH PROPOFOL N/A 03/04/2019  Procedure: ESOPHAGOGASTRODUODENOSCOPY (EGD) WITH PROPOFOL;  Surgeon: Midge Minium, MD;  Location: [facility]  ENDOSCOPY;  Service: Endoscopy;  Laterality: N/A;   INCISION AND DRAINAGE ABSCESS Right 04/16/2019    Procedure: INCISION AND DRAINAGE ABSCESS;  Surgeon: Sung Amabile, DO;  Location: [facility] ORS;  Service: General;  Laterality: Right;   INTERSTIM-GENERATOR CHANGE N/A 12/31/2017    Procedure: INTERSTIM-GENERATOR CHANGE;  Surgeon: Alfredo Martinez, MD;  Location: [facility] ORS;  Service: Urology;  Laterality: N/A;   LIVER BIOPSY   March 2016    Negative   LUNG BIOPSY   2015    small spot on lung   TUBAL LIGATION       UPPER GI ENDOSCOPY       WRIST SURGERY Right 11/02/2019          Home Medications:  Allergies as of 03/05/2023         Reactions    Mirtazapine Anaphylaxis    REACTION: closes throat    Morphine And Codeine      anaphylaxis    Sulfamethoxazole-trimethoprim Rash    REACTION: Whelps all over    Amoxicillin Er Nausea And Vomiting    Pt doesn't remember problem with amoxicillin -  SE Has patient had a PCN reaction causing immediate rash, facial/tongue/throat swelling, SOB or lightheadedness with hypotension: No Has patient had a PCN reaction causing severe rash involving mucus membranes or skin necrosis: No Has patient had a PCN reaction that required hospitalization: No Has patient had a PCN reaction occurring within the last 10 years: no If all of the above answers are "NO", then may proceed with Cephalosporin use.    Codeine Itching    REACTION: itch, rash    Hydrocodone-acetaminophen Rash    REACTION: rash    Keflex [cephalexin] Rash    Pt does not remember having a reaction or rash, no airway involvement    Prasterone Nausea And Vomiting            Medication List           Accurate as of March 05, 2023  8:58 AM. If you have any questions, ask your nurse or doctor.              atorvastatin 80 MG tablet Commonly known as: LIPITOR Take 80 mg by mouth at bedtime.    busPIRone 30 MG tablet Commonly known as: BUSPAR Take 30 mg by mouth 2 (two) times daily.     citalopram 20 MG tablet Commonly known as: CELEXA Take 20 mg by mouth daily.    esomeprazole 40 MG capsule Commonly known as: NEXIUM TAKE 1 CAPSULE BY MOUTH IN THE MORNING    ezetimibe 10 MG tablet Commonly known as: ZETIA Take 10 mg by mouth at bedtime.    fenofibrate micronized 134 MG capsule Commonly known as: LOFIBRA Take 134 mg by mouth at bedtime.    fluticasone 50 MCG/ACT nasal spray Commonly known as: FLONASE PLACE 2 SPRAYS INTO BOTH NOSTRILS DAILY    gabapentin 300 MG capsule Commonly known as: NEURONTIN Take 300 mg by mouth 2 (two) times daily.    gabapentin 600 MG tablet Commonly known as: NEURONTIN Take 600 mg by mouth at bedtime.    Glyxambi 25-5 MG Tabs Generic drug: Empagliflozin-linaGLIPtin Take 1 tablet by mouth daily.    HumaLOG KwikPen 100 UNIT/ML KwikPen Generic drug: insulin lispro Inject into the skin.    HumuLIN R U-500 KwikPen 500 UNIT/ML KwikPen Generic drug: insulin regular human CONCENTRATED Inject 70 Units into  the skin 3 (three) times daily with meals.    hydrOXYzine 50 MG tablet Commonly known as: ATARAX Take 50 mg by mouth 3 (three) times daily.    Insulin Aspart FlexPen 100 UNIT/ML Commonly known as: NOVOLOG Inject into the skin.    lamoTRIgine 200 MG tablet Commonly known as: LAMICTAL Take 200 mg by mouth 2 (two) times daily.    Linzess 145 MCG Caps capsule Generic drug: linaclotide TAKE 1 CAPSULE BY MOUTH DAILY BEFORE BREAKFAST What changed: See the new instructions.    meclizine 25 MG tablet Commonly known as: ANTIVERT TAKE 1 TABLET BY MOUTH 3 TIMES DAILY AS NEEDED FOR DIZZINESS OR NAUSEA    metoprolol tartrate 25 MG tablet Commonly known as: LOPRESSOR Take 1 tablet (25 mg total) by mouth 2 (two) times daily. NEED OV.    nitroGLYCERIN 0.4 MG SL tablet Commonly known as: NITROSTAT Place 1 tablet (0.4 mg total) under the tongue every 5 (five) minutes as needed for chest pain.    ondansetron 4 MG disintegrating  tablet Commonly known as: ZOFRAN-ODT Take 1 tablet (4 mg total) by mouth every 8 (eight) hours as needed.    OneTouch Delica Lancets 30G Misc by Does not apply route.    Pen Needles 31G X 8 MM Misc    promethazine 25 MG suppository Commonly known as: PHENERGAN Place 1 suppository (25 mg total) rectally every 6 (six) hours as needed for nausea or vomiting.    QUEtiapine 300 MG tablet Commonly known as: SEROQUEL Take 300 mg by mouth at bedtime. Along with 200mg  dose for a total of 500mg     SUMAtriptan 100 MG tablet Commonly known as: IMITREX TAKE 1 TABLET AT ONSET OF MIGRAINE. MAY REPEAT IN 2 HOURS IF HEADACHE PERSISTS OR RECURS.    Synthroid 100 MCG tablet Generic drug: levothyroxine Take 100 mcg by mouth daily.    Trulicity 1.5 MG/0.5ML Sopn Generic drug: Dulaglutide Inject 1.5 mg into the skin once a week. (Wednesdays)    Ubrelvy 100 MG Tabs Generic drug: Ubrogepant Take 1 tablet by mouth every other day.             Allergies:  Allergies       Allergies  Allergen Reactions   Mirtazapine Anaphylaxis      REACTION: closes throat   Morphine And Codeine        anaphylaxis   Sulfamethoxazole-Trimethoprim Rash      REACTION: Whelps all over   Amoxicillin Er Nausea And Vomiting      Pt doesn't remember problem with amoxicillin -  SE Has patient had a PCN reaction causing immediate rash, facial/tongue/throat swelling, SOB or lightheadedness with hypotension: No Has patient had a PCN reaction causing severe rash involving mucus membranes or skin necrosis: No Has patient had a PCN reaction that required hospitalization: No Has patient had a PCN reaction occurring within the last 10 years: no If all of the above answers are "NO", then may proceed with Cephalosporin use.   Codeine Itching      REACTION: itch, rash   Hydrocodone-Acetaminophen Rash      REACTION: rash   Keflex [Cephalexin] Rash      Pt does not remember having a reaction or rash, no airway involvement    Prasterone Nausea And Vomiting        Family History:      Family History  Problem Relation Age of Onset   Diabetes Mother     Heart disease Mother  Hyperlipidemia Mother     Hypertension Mother     Kidney disease Mother     Mental illness Mother     Hypothyroidism Mother     Stroke Mother     Osteoporosis Mother     Glaucoma Mother     Congestive Heart Failure Mother     Hypertension Father     Kidney disease Brother     Intracerebral hemorrhage Brother     Breast cancer Maternal Grandmother 60   Heart disease Maternal Grandfather     Rheum arthritis Maternal Grandfather     Cancer Maternal Grandfather          liver   Kidney disease Maternal Grandfather     Cancer Paternal Grandmother          lung   Asthma Daughter     Cancer Daughter          throat   Bipolar disorder Daughter     Bipolar disorder Daughter     Hypertension Daughter     Restless legs syndrome Daughter            Social History:  reports that she has never smoked. She has never used smokeless tobacco. She reports that she does not drink alcohol and does not use drugs.   ROS:                           Physical Exam: BP 139/80   Ht 4\' 11"  (1.499 m)   Wt 87.5 kg   BMI 38.98 kg/m   Constitutional:  Alert and oriented, No acute distress. HEENT: [place] AT, moist mucus membranes.  Trachea midline, no masses. Cardiovascular: No clubbing, cyanosis, or edema.     Laboratory Data: Recent Labs       Lab Results  Component Value Date    WBC 7.0 02/21/2023    HGB 13.0 02/21/2023    HCT 41.4 02/21/2023    MCV 92.2 02/21/2023    PLT 215 02/21/2023        Recent Labs       Lab Results  Component Value Date    CREATININE 1.27 (H) 02/21/2023        Recent Labs  No results found for: "PSA"     Recent Labs  No results found for: "TESTOSTERONE"     Recent Labs       Lab Results  Component Value Date    HGBA1C 8.2 10/24/2022        Urinalysis Labs (Brief)           Component Value Date/Time    COLORURINE YELLOW (A) 02/21/2023 1829    APPEARANCEUR CLEAR (A) 02/21/2023 1829    APPEARANCEUR Hazy (A) 09/19/2021 0937    LABSPEC 1.030 02/21/2023 1829    LABSPEC 1.026 10/09/2014 1808    PHURINE 5.0 02/21/2023 1829    GLUCOSEU >=500 (A) 02/21/2023 1829    GLUCOSEU >=500 10/09/2014 1808    HGBUR NEGATIVE 02/21/2023 1829    HGBUR moderate 01/04/2009 0913    BILIRUBINUR NEGATIVE 02/21/2023 1829    BILIRUBINUR negative 11/17/2022 0955    BILIRUBINUR Negative 09/19/2021 0937    BILIRUBINUR Negative 10/09/2014 1808    KETONESUR NEGATIVE 02/21/2023 1829    PROTEINUR NEGATIVE 02/21/2023 1829    UROBILINOGEN 0.2 11/17/2022 0955    UROBILINOGEN 0.2 01/04/2009 0913    NITRITE NEGATIVE 02/21/2023 1829    LEUKOCYTESUR NEGATIVE 02/21/2023 1829    LEUKOCYTESUR Negative  10/09/2014 1808        Pertinent Imaging: Urine reviewed.  Urine sent for culture.  Chart reviewed   Assessment & Plan: It appears that the lead is approximately 59 years old and the IPG was replaced in 2019.  I will get a lateral and AP view of the sacrum and have her see our nurse practitioner for troubleshooting and assessment.  I will give her Gemtesa samples in the interim since she is never tried this medication.  Call if culture positive.  It would likely be in the best interest of the patient that if we replaced the IPG battery that the lead is also replaced.  I did review this in full detail today but she still needs to be assessed.  Sequelae of retained tip discussed.  Increased risk of infection discussed.  Unsatisfactory results discussed.   Again the lead is 59 years old and she agreed that we will go ahead and replace the entire device.  I have counseled her for surgery but I want the above steps performed and we will proceed accordingly   Patient is on blood thinners and we need to have clearance to discontinue them

## 2023-07-16 NOTE — Transfer of Care (Signed)
Immediate Anesthesia Transfer of Care Note  Patient: [name]  Procedure(s) Performed: REMOVAL OF Leane Platt IMPLANT INTERSTIM IMPLANT FIRST STAGE WITH IMPEDANCE CHECK INTERSTIM IMPLANT SECOND STAGE  Patient Location: PACU  Anesthesia Type:General  Level of Consciousness: drowsy and patient cooperative  Airway & Oxygen Therapy: Patient Spontanous Breathing and Patient connected to nasal cannula oxygen  Post-op Assessment: Report given to RN and Patient moving all extremities  Post vital signs: Reviewed and stable  Last Vitals:  Vitals Value Taken Time  BP 116/55 07/16/23 1449  Temp 36.4 C 07/16/23 1449  Pulse 82 07/16/23 1449  Resp 18 07/16/23 1449  SpO2 98 % 07/16/23 1449  Vitals shown include unfiled device data.  Last Pain:  Vitals:   07/16/23 1449  TempSrc: Tympanic  PainSc: 0-No pain         Complications: No notable events documented.

## 2023-07-16 NOTE — Anesthesia Preprocedure Evaluation (Addendum)
Anesthesia Evaluation  Patient identified by MRN, date of birth, ID band Patient awake    Reviewed: Allergy & Precautions, H&P , NPO status , Patient's Chart, lab work & pertinent test results, reviewed documented beta blocker date and time   History of Anesthesia Complications Negative for: history of anesthetic complications  Airway Mallampati: II  TM Distance: >3 FB Neck ROM: full    Dental no notable dental hx. (+) Chipped   Pulmonary neg pulmonary ROS, with exertion, asthma , sleep apnea , COPD,  oxygen dependent, neg recent URI   Pulmonary exam normal        Cardiovascular Exercise Tolerance: Poor hypertension, On Medications (-) angina + CAD (LHC: 10/29/2020: mod p-mLAD disease), + Past MI (2009: Heart attack), + Peripheral Vascular Disease and +CHF  (-) Cardiac Stents and (-) CABG negative cardio ROS Normal cardiovascular exam+ dysrhythmias (infrequent episodes of palpitation) (-) Valvular Problems/Murmurs   Long-term cardiac event monitor study performed on 11/24/2021 revealing a predominant underlying normal sinus rhythm at an average rate of 85 bpm; range 56-127 bpm.  There were no significant arrhythmias or prolonged pauses noted.    Most recent TTE with bubble study was performed on 02/12/2022 revealing normal left ventricular systolic function with an EF of 55 to 60%.  There were no regional wall motion abnormalities.  Mild LVH noted. Left ventricular diastolic Doppler parameters consistent with abnormal relaxation (G1DD).  Right ventricular size and function normal.  All transvalvular gradients were noted to be normal providing no evidence suggestive of valvular stenosis.  Aorta normal in size with no evidence of aneurysmal dilatation.  Agitated saline contrast bubble study was negative with no evidence of intra-atrial shunting.    Per cardiology (Hammock, NP-C), "patient denies shortness of breath, changes in chronic  chest discomfort and she states that her chest discomfort has been decreased with a decrease in her palpitations.  She also thinks that her nerve stimulator has an impact on her discomfort as well.  She is scheduled to undergo upcoming procedure movable of InterStim implant, stage I and II InterStim placement with impedance check by Dr. Sherron Monday. According to ACC/AHA guidelines no further cardiovascular testing stating the patient may proceed to surgery at ACCEPTABLE risk".      Neuro/Psych neg Seizures PSYCHIATRIC DISORDERS Anxiety Depression Bipolar Disorder Schizophrenia  chronic cerebral microvascular ischemic changes vertigo TIA (on DAPT therapy per neurology recommendations;  started 10/2021) Neuromuscular disease (Diabetic neuropathy) negative neurological ROS  negative psych ROS   GI/Hepatic negative GI ROS, Neg liver ROS,  Medicated,,NASH   Endo/Other  negative endocrine ROSdiabetes, Poorly Controlled, Insulin DependentHypothyroidism  Per internal medicine Carlynn Purl, MD), "initially scheduled for 05/21/2023 , however her A1C at the time was over 10.4 % and due to risk of infection we advised her to postpone procedure until glucose under better control. she has been compliant with medications for DM, she has changed her diet and is losing weight. She established care with a new Endo last week and had some medication adjustments .  She had a fructosamine level last week was within normal limits. We will clear her for surgery today  Renal/GU Renal InsufficiencyRenal disease     Musculoskeletal   Abdominal   Peds  Hematology negative hematology ROS (+) Blood dyscrasia, anemia IDA   Anesthesia Other Findings Past Medical History: No date: Adenomatous colon polyp No date: Anxiety No date: Arthritis No date: Asthma No date: Atherosclerosis of aorta (HCC) 09/10/2022: Bacteremia due to Escherichia coli No date: Bipolar depression (HCC)  No date: Cerebral microvascular disease No  date: CHF (congestive heart failure) (HCC)     Comment:  a.) TTE 10/05/2021: EF 60-65%, no RWMAs, norm RVSF,               G1DD; b.) TTE with bubble study 02/12/2022: EF 55-60%,               mild LVH, no RWMAs, no IAS, G1DD No date: Chronic bronchitis (HCC) No date: CKD (chronic kidney disease), stage III (HCC) No date: Constipation     Comment:  a.) on linaclotide No date: COPD (chronic obstructive pulmonary disease) (HCC) No date: Coronary artery disease     Comment:  a.) LHC 04/13/2011: normal coronaries; no CAD; b.) MV               10/29/2020: mod p-mLAD disease No date: Depression No date: Diabetic neuropathy (HCC) No date: Dyspnea No date: Encephalopathy acute No date: Gastric ulcer No date: GERD (gastroesophageal reflux disease) No date: Headache 2009: Heart attack (HCC) No date: Hepatomegaly 04/28/2020: History of 2019 novel coronavirus disease (COVID-19)     Comment:  PCR (+): 04/28/2020, 02/14/2021 No date: Hyperlipidemia No date: Hypertension No date: Hypomagnesemia No date: Hypothyroid No date: IC (interstitial cystitis) No date: IDA (iron deficiency anemia) No date: IgG deficiency (HCC) No date: Liver lesion     Comment:  Favored to be benign on CT; MRI of abd w/contract in Aug              2016. Liver Biopsy-Negative, March 2016 No date: Long term current use of aspirin No date: Long term current use of clopidogrel No date: Migraines No date: Morbid obesity (HCC) No date: NASH (nonalcoholic steatohepatitis) No date: OSA (obstructive sleep apnea)     Comment:  a.) no nocturnal PAP therapy; issues with fit of DME;               using 2L/[place] supplemental oxygen No date: Palpitation No date: Peripheral vascular disease (HCC) No date: Pneumonia No date: Recurrent falls No date: RLS (restless legs syndrome) No date: Schizoaffective disorder, bipolar type (HCC) No date: Seasonal allergies No date: Tachycardia 2023: TIA (transient ischemic attack)     Comment:   a.) on DAPT therapy per neurology recommendations;               started 10/2021 10/15/2015: Type 2 diabetes, uncontrolled, with neuropathy     Comment:  Overview:  Microalbumin 7.7 12/2010: Hgb A1c 10.2% 12/2010              and 9.7% 03/2011, eye exam 10/2009 [facility] DM clinic  No date: Urinary incontinence No date: Vertigo No date: Vitamin B12 deficiency No date: Vitamin D deficiency  Past Surgical History: No date: ABDOMINAL HYSTERECTOMY     Comment:  Partial 2007: bladder stim No date: BLADDER SURGERY     Comment:  x 2. tacking of bladder and the other was the stimulator              placement No date: CHOLECYSTECTOMY 03/04/2019: COLONOSCOPY WITH PROPOFOL; N/A     Comment:  Procedure: COLONOSCOPY WITH PROPOFOL;  Surgeon: Midge Minium, MD;  Location: [facility] ENDOSCOPY;  Service:               Endoscopy;  Laterality: N/A; 03/04/2019: ESOPHAGOGASTRODUODENOSCOPY (EGD) WITH PROPOFOL; N/A     Comment:  Procedure: ESOPHAGOGASTRODUODENOSCOPY (EGD) WITH  PROPOFOL;  Surgeon: Midge Minium, MD;  Location[facility]               ENDOSCOPY;  Service: Endoscopy;  Laterality: N/A; 04/16/2019: INCISION AND DRAINAGE ABSCESS; Right     Comment:  Procedure: INCISION AND DRAINAGE ABSCESS;  Surgeon:               Sung Amabile, DO;  Location: [facility] ORS;  Service: General;              Laterality: Right; 12/31/2017: INTERSTIM-GENERATOR CHANGE; N/A     Comment:  Procedure: INTERSTIM-GENERATOR CHANGE;  Surgeon:               Alfredo Martinez, MD;  Location: [facility] ORS;  Service:               Urology;  Laterality: N/A; 04/13/2011: LEFT HEART CATH AND CORONARY ANGIOGRAPHY; Left     Comment:  Procedure: LEFT HEART CATH AND CORONARY ANGIOGRAPHY;               Location: [facility]; Surgeon: Arnoldo Hooker, MD 11/2014: LIVER BIOPSY     Comment:  Negative 2015: LUNG BIOPSY     Comment:  small spot on lung No date: TUBAL LIGATION No date: UPPER GI ENDOSCOPY 11/02/2019: WRIST SURGERY; Right      Reproductive/Obstetrics negative OB ROS                             Anesthesia Physical Anesthesia Plan  ASA: 3  Anesthesia Plan: General   Post-op Pain Management:    Induction: Intravenous  PONV Risk Score and Plan: 3 and Propofol infusion, TIVA and Ondansetron  Airway Management Planned: Oral ETT  Additional Equipment:   Intra-op Plan:   Post-operative Plan: Extubation in OR  Informed Consent: I have reviewed the patients History and Physical, chart, labs and discussed the procedure including the risks, benefits and alternatives for the proposed anesthesia with the patient or authorized representative who has indicated his/her understanding and acceptance.     Dental Advisory Given  Plan Discussed with: Anesthesiologist, CRNA and Surgeon  Anesthesia Plan Comments: (Patient consented for risks of anesthesia including but not limited to:  - adverse reactions to medications - damage to eyes, teeth, lips or other oral mucosa - nerve damage due to positioning  - sore throat or hoarseness - Damage to heart, brain, nerves, lungs, other parts of body or loss of life  Patient voiced understanding and assent.)        Anesthesia Quick Evaluation

## 2023-07-16 NOTE — Anesthesia Procedure Notes (Addendum)
Procedure Name: Intubation Date/Time: 07/16/2023 1:25 PM  Performed by: Lily Lovings, CRNAPre-anesthesia Checklist: Patient identified, Patient being monitored, Timeout performed, Emergency Drugs available and Suction available Patient Re-evaluated:Patient Re-evaluated prior to induction Oxygen Delivery Method: Circle system utilized Preoxygenation: Pre-oxygenation with 100% oxygen Induction Type: IV induction Ventilation: Mask ventilation without difficulty Laryngoscope Size: 3 and McGraph Grade View: Grade I Tube type: Oral Tube size: 7.0 mm Number of attempts: 1 Airway Equipment and Method: Stylet Placement Confirmation: ETT inserted through vocal cords under direct vision, positive ETCO2 and breath sounds checked- equal and bilateral Secured at: 18 cm Tube secured with: Tape Dental Injury: Teeth and Oropharynx as per pre-operative assessment

## 2023-07-16 NOTE — Discharge Instructions (Signed)
I have reviewed discharge instructions in detail with the patient. They will follow-up with me or their physician as scheduled. My nurse will also be calling the patients as per protocol.   No tub baths.  Patient may shower but only quick shower in the back. Removed sterile dressing Saturday Can drive car his symptoms off regular narcotic pain medicine.  No heavy straining or lifting for 4 weeks Otherwise normal activity We will call and check on her tomorrow

## 2023-07-16 NOTE — Progress Notes (Signed)
No change in incontinence No UTI HX: sugars a bit better Chart reviewed  CV/Resp exam normal

## 2023-07-17 ENCOUNTER — Encounter: Payer: Self-pay | Admitting: Urology

## 2023-07-17 ENCOUNTER — Telehealth: Payer: Self-pay

## 2023-07-17 NOTE — Anesthesia Postprocedure Evaluation (Signed)
Anesthesia Post Note  Patient: Fran Lowes Failla  Procedure(s) Performed: REMOVAL OF INTERSTIM IMPLANT INTERSTIM IMPLANT FIRST STAGE WITH IMPEDANCE CHECK INTERSTIM IMPLANT SECOND STAGE  Patient location during evaluation: PACU Anesthesia Type: General Level of consciousness: awake and alert Pain management: pain level controlled Vital Signs Assessment: post-procedure vital signs reviewed and stable Respiratory status: spontaneous breathing, nonlabored ventilation and respiratory function stable Cardiovascular status: blood pressure returned to baseline and stable Postop Assessment: no apparent nausea or vomiting Anesthetic complications: no   No notable events documented.   Last Vitals:  Vitals:   07/16/23 1530 07/16/23 1541  BP: 136/78 130/66  Pulse: 80 82  Resp: 15 (!) 22  Temp: 36.6 C (!) 36.1 C  SpO2: 98% 99%    Last Pain:  Vitals:   07/16/23 1541  TempSrc: Temporal  PainSc: 0-No pain                 Foye Deer

## 2023-07-17 NOTE — Telephone Encounter (Signed)
Post Surgery Problem Recent surgery:REMOVAL OF INTERSTIM IMPLANT,  INTERSTIM IMPLANT FIRST STAGE WITH IMPEDANCE CHECK   When: 07/16/2023 Pain: sorness Signs and symptoms: Fever,chills:no Nausea,vomiting:no nausea or vomiting  Voiding: pt states no issues urinating  Frequency:persistent Hematuria:no Incomplete Emptying: yes Plan:  Appointment w/Physician: 07/30/2023, 2 week wound check with Dr.MacDiarmid Advice given: Call us with any questions or concerns.

## 2023-07-27 ENCOUNTER — Other Ambulatory Visit: Payer: Self-pay | Admitting: Family Medicine

## 2023-07-30 ENCOUNTER — Ambulatory Visit: Payer: Medicare Other | Admitting: Urology

## 2023-07-31 DIAGNOSIS — F411 Generalized anxiety disorder: Secondary | ICD-10-CM | POA: Diagnosis not present

## 2023-07-31 DIAGNOSIS — F250 Schizoaffective disorder, bipolar type: Secondary | ICD-10-CM | POA: Diagnosis not present

## 2023-08-03 ENCOUNTER — Telehealth: Payer: Self-pay | Admitting: Gastroenterology

## 2023-08-03 NOTE — Telephone Encounter (Signed)
The patient called in to cancel her office because she is feeling better.

## 2023-08-06 ENCOUNTER — Ambulatory Visit: Payer: Medicare Other | Admitting: Gastroenterology

## 2023-08-06 DIAGNOSIS — E1165 Type 2 diabetes mellitus with hyperglycemia: Secondary | ICD-10-CM | POA: Diagnosis not present

## 2023-08-06 DIAGNOSIS — E66812 Obesity, class 2: Secondary | ICD-10-CM | POA: Diagnosis not present

## 2023-08-06 DIAGNOSIS — E160 Drug-induced hypoglycemia without coma: Secondary | ICD-10-CM | POA: Diagnosis not present

## 2023-08-06 DIAGNOSIS — T383X5A Adverse effect of insulin and oral hypoglycemic [antidiabetic] drugs, initial encounter: Secondary | ICD-10-CM | POA: Insufficient documentation

## 2023-08-06 DIAGNOSIS — E0580 Other thyrotoxicosis without thyrotoxic crisis or storm: Secondary | ICD-10-CM | POA: Diagnosis not present

## 2023-08-13 ENCOUNTER — Ambulatory Visit: Payer: Medicare Other | Admitting: Urology

## 2023-08-13 ENCOUNTER — Encounter: Payer: Self-pay | Admitting: Urology

## 2023-08-13 VITALS — BP 128/74 | HR 71 | Ht 59.0 in | Wt 176.8 lb

## 2023-08-13 DIAGNOSIS — N3941 Urge incontinence: Secondary | ICD-10-CM

## 2023-08-13 NOTE — Progress Notes (Signed)
08/13/2023 9:45 AM   [name] Apr 10, 1966 [id number]  Referring provider: Alba Cory, MD [address] [place],  [place] [address]  Chief Complaint  Patient presents with   Post-op Follow-up    HPI: I replaced the patient's InterStim November 2024.  In the last year she was soaking 3 pads a day with bedwetting.  She had a bedside commode.  She was going every 1-2 hours.  It was initially placed in 2011 by Dr. Barnabas Lister.  I made a separate midline incision for more direct replacement after removing the device.  The case went very well  Urgent continence persisting.  Frequency stable.  She has more awareness of her bladder but she still leaking.  She cannot remember how to turn up the amplitude or change programs nor can her husband.     PMH: Past Medical History:  Diagnosis Date   Adenomatous colon polyp    Anxiety    Arthritis    Asthma    Atherosclerosis of aorta (HCC)    Bacteremia due to Escherichia coli 09/10/2022   Bipolar depression (HCC)    Cerebral microvascular disease    CHF (congestive heart failure) (HCC)    a.) TTE 10/05/2021: EF 60-65%, no RWMAs, norm RVSF, G1DD; b.) TTE with bubble study 02/12/2022: EF 55-60%, mild LVH, no RWMAs, no IAS, G1DD   Chronic bronchitis (HCC)    CKD (chronic kidney disease), stage III (HCC)    Constipation    a.) on linaclotide   COPD (chronic obstructive pulmonary disease) (HCC)    Coronary artery disease    a.) LHC 04/13/2011: normal coronaries; no CAD; b.) MV 10/29/2020: mod p-mLAD disease   Depression    Diabetic neuropathy (HCC)    Dyspnea    Encephalopathy acute    Gastric ulcer    GERD (gastroesophageal reflux disease)    Headache    Heart attack (HCC) 2009   Hepatomegaly    History of 2019 novel coronavirus disease (COVID-19) 04/28/2020   PCR (+): 04/28/2020, 02/14/2021   Hyperlipidemia    Hypertension    Hypomagnesemia    Hypothyroid    IC (interstitial cystitis)    IDA (iron deficiency  anemia)    IgG deficiency (HCC)    Liver lesion    Favored to be benign on CT; MRI of abd w/contract in Aug 2016. Liver Biopsy-Negative, March 2016   Long term current use of aspirin    Long term current use of clopidogrel    Migraines    Morbid obesity (HCC)    NASH (nonalcoholic steatohepatitis)    OSA (obstructive sleep apnea)    a.) no nocturnal PAP therapy; issues with fit of DME; using 2L/[place] supplemental oxygen   Palpitation    Peripheral vascular disease (HCC)    Pneumonia    Recurrent falls    RLS (restless legs syndrome)    Schizoaffective disorder, bipolar type (HCC)    Seasonal allergies    Tachycardia    TIA (transient ischemic attack) 2023   a.) on DAPT therapy per neurology recommendations; started 10/2021   Type 2 diabetes, uncontrolled, with neuropathy 10/15/2015   Overview:  Microalbumin 7.7 12/2010: Hgb A1c 10.2% 12/2010 and 9.7% 03/2011, eye exam 10/2009 [facility] DM clinic    Urinary incontinence    Vertigo    Vitamin B12 deficiency    Vitamin D deficiency     Surgical History: Past Surgical History:  Procedure Laterality Date   ABDOMINAL HYSTERECTOMY     Partial  bladder stim  2007   BLADDER SURGERY     x 2. tacking of bladder and the other was the stimulator placement   CHOLECYSTECTOMY     COLONOSCOPY WITH PROPOFOL N/A 03/04/2019   Procedure: COLONOSCOPY WITH PROPOFOL;  Surgeon: Midge Minium, MD;  Location: [facility] ENDOSCOPY;  Service: Endoscopy;  Laterality: N/A;   ESOPHAGOGASTRODUODENOSCOPY (EGD) WITH PROPOFOL N/A 03/04/2019   Procedure: ESOPHAGOGASTRODUODENOSCOPY (EGD) WITH PROPOFOL;  Surgeon: Midge Minium, MD;  Location: [facility] ENDOSCOPY;  Service: Endoscopy;  Laterality: N/A;   INCISION AND DRAINAGE ABSCESS Right 04/16/2019   Procedure: INCISION AND DRAINAGE ABSCESS;  Surgeon: Sung Amabile, DO;  Location: [facility] ORS;  Service: General;  Laterality: Right;   INTERSTIM IMPLANT PLACEMENT N/A 07/16/2023   Procedure: Leane Platt IMPLANT FIRST STAGE WITH IMPEDANCE  CHECK;  Surgeon: Alfredo Martinez, MD;  Location: [facility] ORS;  Service: Urology;  Laterality: N/A;   INTERSTIM IMPLANT PLACEMENT N/A 07/16/2023   Procedure: Leane Platt IMPLANT SECOND STAGE;  Surgeon: Alfredo Martinez, MD;  Location: [facility] ORS;  Service: Urology;  Laterality: N/A;   INTERSTIM IMPLANT REMOVAL N/A 07/16/2023   Procedure: REMOVAL OF INTERSTIM IMPLANT;  Surgeon: Alfredo Martinez, MD;  Location: [facility] ORS;  Service: Urology;  Laterality: N/A;   INTERSTIM-GENERATOR CHANGE N/A 12/31/2017   Procedure: INTERSTIM-GENERATOR CHANGE;  Surgeon: Alfredo Martinez, MD;  Location: [facility] ORS;  Service: Urology;  Laterality: N/A;   LEFT HEART CATH AND CORONARY ANGIOGRAPHY Left 04/13/2011   Procedure: LEFT HEART CATH AND CORONARY ANGIOGRAPHY; Location: [facility]; Surgeon: Arnoldo Hooker, MD   LIVER BIOPSY  11/2014   Negative   LUNG BIOPSY  2015   small spot on lung   TUBAL LIGATION     UPPER GI ENDOSCOPY     WRIST SURGERY Right 11/02/2019    Home Medications:  Allergies as of 08/13/2023       Reactions   Mirtazapine Anaphylaxis   closes throat   Morphine And Codeine Anaphylaxis   Sulfamethoxazole-trimethoprim Rash   Whelps all over   Amoxicillin Er Nausea And Vomiting   Codeine Itching, Rash   Hydrocodone-acetaminophen Rash   Keflex [cephalexin] Rash   Pt does not remember having a reaction or rash, no airway involvement   Prasterone Nausea And Vomiting        Medication List        Accurate as of August 13, 2023  9:45 AM. If you have any questions, ask your nurse or doctor.          atorvastatin 80 MG tablet Commonly known as: LIPITOR Take 1 tablet (80 mg total) by mouth at bedtime.   busPIRone 30 MG tablet Commonly known as: BUSPAR Take 30 mg by mouth 2 (two) times daily.   cetirizine 10 MG tablet Commonly known as: ZYRTEC Take 1 tablet (10 mg total) by mouth daily. What changed: when to take this   citalopram 20 MG tablet Commonly known as: CELEXA Take 20 mg by  mouth every morning.   ezetimibe 10 MG tablet Commonly known as: ZETIA Take 1 tablet (10 mg total) by mouth at bedtime.   fenofibrate micronized 134 MG capsule Commonly known as: LOFIBRA Take 1 capsule (134 mg total) by mouth at bedtime.   fluticasone 50 MCG/ACT nasal spray Commonly known as: FLONASE Place 2 sprays into both nostrils daily. What changed:  when to take this reasons to take this   gabapentin 600 MG tablet Commonly known as: NEURONTIN Take 600 mg by mouth at bedtime.   HumaLOG KwikPen 100 UNIT/ML KwikPen Generic drug:  insulin lispro Inject into the skin as needed (high blood sguar). USE ONLY FOR GLUCOSE OVER 300   HumuLIN R U-500 KwikPen 500 UNIT/ML KwikPen Generic drug: insulin regular human CONCENTRATED Inject 50 Units into the skin 3 (three) times daily with meals.   hydrOXYzine 50 MG tablet Commonly known as: ATARAX Take 50 mg by mouth 3 (three) times daily as needed for anxiety.   Jardiance 25 MG Tabs tablet Generic drug: empagliflozin Take 25 mg by mouth daily.   lamoTRIgine 200 MG tablet Commonly known as: LAMICTAL Take 200 mg by mouth 2 (two) times daily.   meclizine 25 MG tablet Commonly known as: ANTIVERT Take 25 mg by mouth 3 (three) times daily as needed for dizziness.   metoprolol tartrate 25 MG tablet Commonly known as: LOPRESSOR Take 1 tablet (25 mg total) by mouth 2 (two) times daily.   Multi-Vitamin tablet Take 1 tablet by mouth daily.   nitroGLYCERIN 0.4 MG SL tablet Commonly known as: NITROSTAT Place 1 tablet (0.4 mg total) under the tongue every 5 (five) minutes as needed for chest pain.   ondansetron 4 MG disintegrating tablet Commonly known as: ZOFRAN-ODT Take 1 tablet (4 mg total) by mouth every 8 (eight) hours as needed.   OneTouch Delica Lancets 30G Misc by Does not apply route.   oxyCODONE-acetaminophen 5-325 MG tablet Commonly known as: Percocet Take 1-2 tablets by mouth every 8 (eight) hours as needed for  severe pain (pain score 7-10) or moderate pain (pain score 4-6).   QUEtiapine 300 MG tablet Commonly known as: SEROQUEL Take 300 mg by mouth at bedtime.   Synthroid 88 MCG tablet Generic drug: levothyroxine Take 1 tablet (88 mcg total) by mouth daily before breakfast.        Allergies:  Allergies  Allergen Reactions   Mirtazapine Anaphylaxis    closes throat   Morphine And Codeine Anaphylaxis   Sulfamethoxazole-Trimethoprim Rash    Whelps all over   Amoxicillin Er Nausea And Vomiting   Codeine Itching and Rash   Hydrocodone-Acetaminophen Rash   Keflex [Cephalexin] Rash    Pt does not remember having a reaction or rash, no airway involvement   Prasterone Nausea And Vomiting    Family History: Family History  Problem Relation Age of Onset   Diabetes Mother    Heart disease Mother    Hyperlipidemia Mother    Hypertension Mother    Kidney disease Mother    Mental illness Mother    Hypothyroidism Mother    Stroke Mother    Osteoporosis Mother    Glaucoma Mother    Congestive Heart Failure Mother    Hypertension Father    Kidney disease Brother    Intracerebral hemorrhage Brother    Breast cancer Maternal Grandmother 60   Heart disease Maternal Grandfather    Rheum arthritis Maternal Grandfather    Cancer Maternal Grandfather        liver   Kidney disease Maternal Grandfather    Cancer Paternal Grandmother        lung   Asthma Daughter    Cancer Daughter        throat   Bipolar disorder Daughter    Bipolar disorder Daughter    Hypertension Daughter    Restless legs syndrome Daughter     Social History:  reports that she has never smoked. She has never used smokeless tobacco. She reports that she does not drink alcohol and does not use drugs.  ROS:  Physical Exam: BP 128/74   Pulse 71   Ht 4\' 11"  (1.499 m)   Wt 80.2 kg   BMI 35.71 kg/m   Constitutional:  Alert and oriented, No acute  distress. HEENT:  AT, moist mucus membranes.  Trachea midline, no masses.  Laboratory Data: Lab Results  Component Value Date   WBC 5.9 06/23/2023   HGB 11.3 (L) 06/23/2023   HCT 36.7 06/23/2023   MCV 94.1 06/23/2023   PLT 241 06/23/2023    Lab Results  Component Value Date   CREATININE 1.32 (H) 06/23/2023    No results found for: "PSA"  No results found for: "TESTOSTERONE"  Lab Results  Component Value Date   HGBA1C 10.4 (H) 05/09/2023    Urinalysis    Component Value Date/Time   COLORURINE YELLOW (A) 06/23/2023 1133   APPEARANCEUR CLEAR (A) 06/23/2023 1133   APPEARANCEUR Clear 03/05/2023 0847   LABSPEC 1.025 06/23/2023 1133   LABSPEC 1.026 10/09/2014 1808   PHURINE 5.0 06/23/2023 1133   GLUCOSEU >=500 (A) 06/23/2023 1133   GLUCOSEU >=500 10/09/2014 1808   HGBUR NEGATIVE 06/23/2023 1133   HGBUR moderate 01/04/2009 0913   BILIRUBINUR NEGATIVE 06/23/2023 1133   BILIRUBINUR Negative 03/05/2023 0847   BILIRUBINUR Negative 10/09/2014 1808   KETONESUR NEGATIVE 06/23/2023 1133   PROTEINUR NEGATIVE 06/23/2023 1133   UROBILINOGEN 0.2 11/17/2022 0955   UROBILINOGEN 0.2 01/04/2009 0913   NITRITE NEGATIVE 06/23/2023 1133   LEUKOCYTESUR NEGATIVE 06/23/2023 1133   LEUKOCYTESUR Negative 10/09/2014 1808    Pertinent Imaging:   Assessment & Plan: Incisions look excellent.  A few sutures will still dissolve and this was discussed.  See extender and bring device for interrogation and teaching.  Reassess in 6 months assuming she does well with teaching  1. Urge incontinence of urine  - Urinalysis, Complete   No follow-ups on file.  Martina Sinner, MD  [facility] [address] [place], [place] [address] [phone]

## 2023-08-23 IMAGING — CT CT HEAD W/O CM
4 series · 16 of 47 positions shown, 18 images · non-contrast
Comparison: 10/31/2021

CLINICAL DATA: Head trauma, focal neuro findings (Age 18-64y)



[Series 2: head bone · axial · 0.39mm/px · z∈[-572,-544]mm · 3 of 74 slices shown]
[im 8/74  bone]
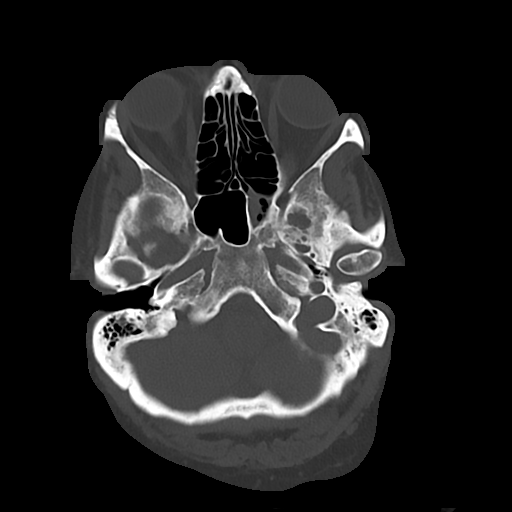
[im 15/74  bone]
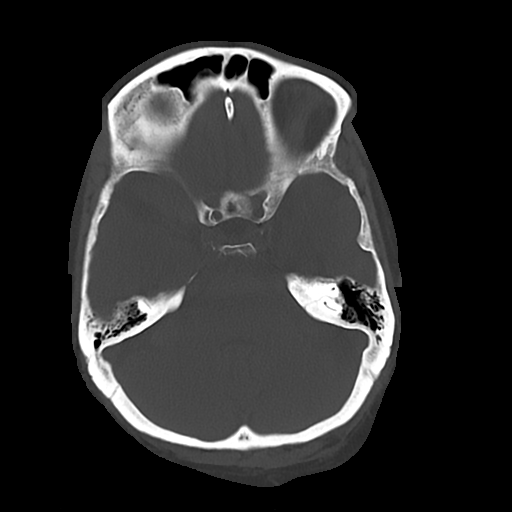
[im 22/74  bone]
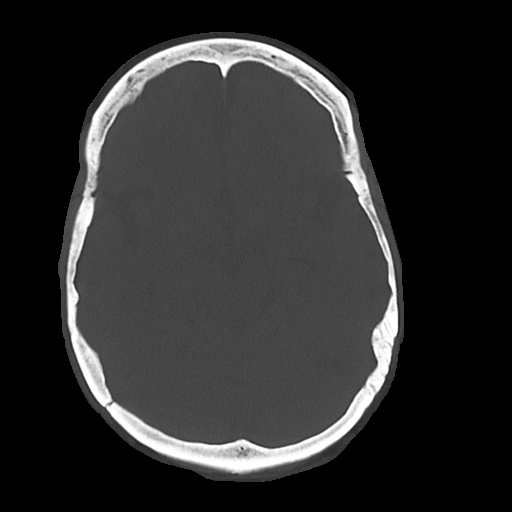

[Series 3: head wo · axial · 0.39mm/px · z∈[-571,-461]mm · 7 of 30 slices shown, 9 images]
[im 4/30  brain]
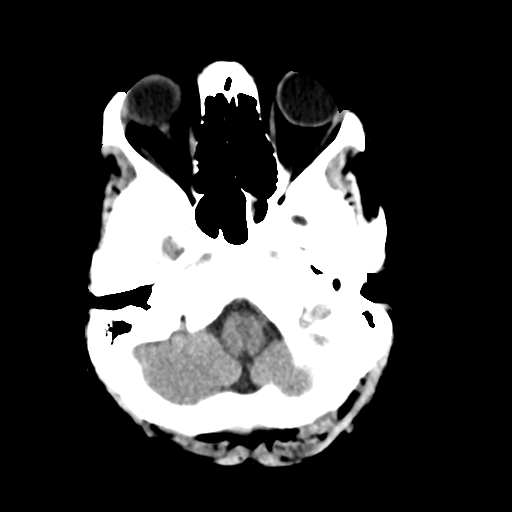
[im 4/30  bone]
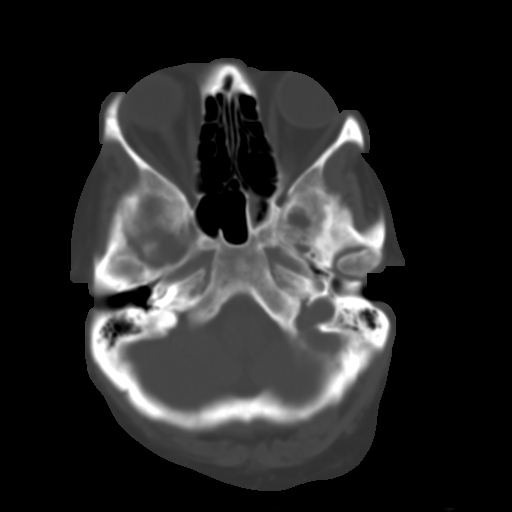
[im 8/30  brain]
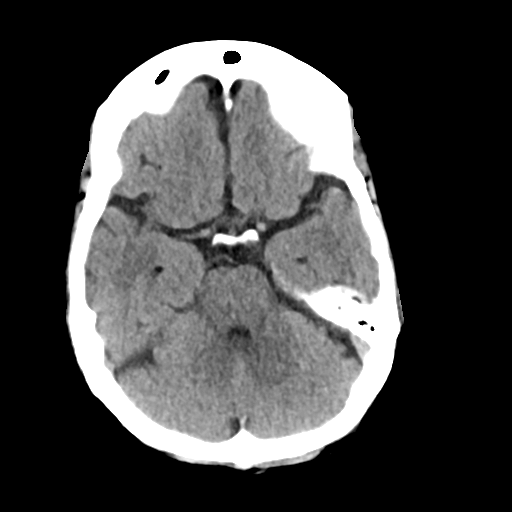
[im 11/30  brain]
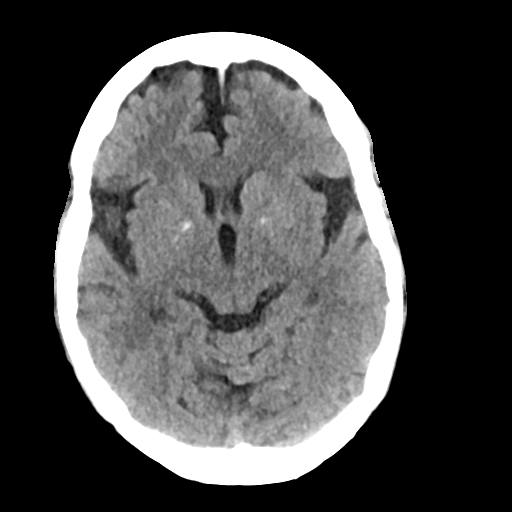
[im 15/30  brain]
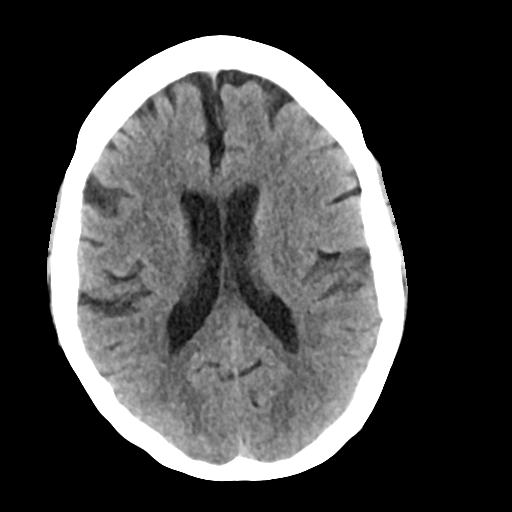
[im 19/30  brain]
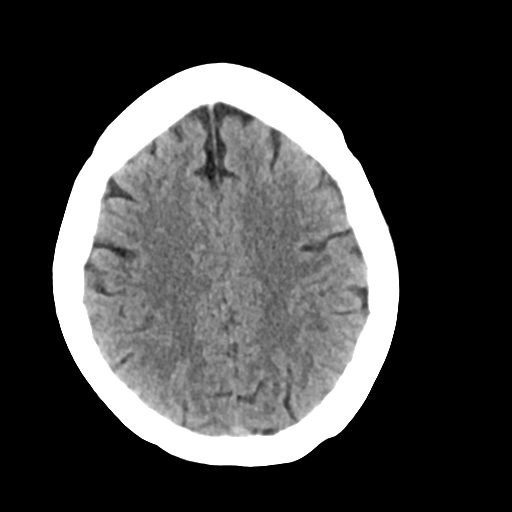
[im 19/30  bone]
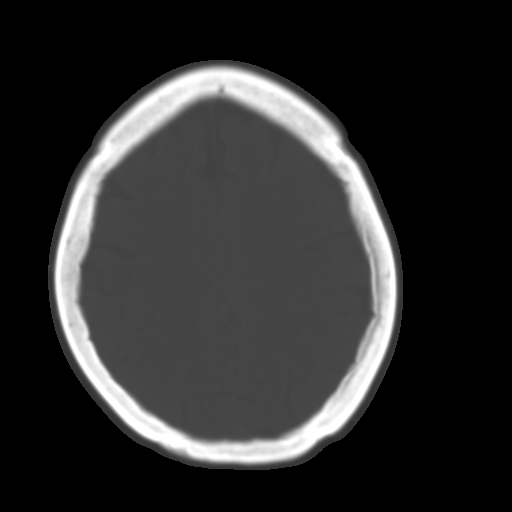
[im 22/30  brain]
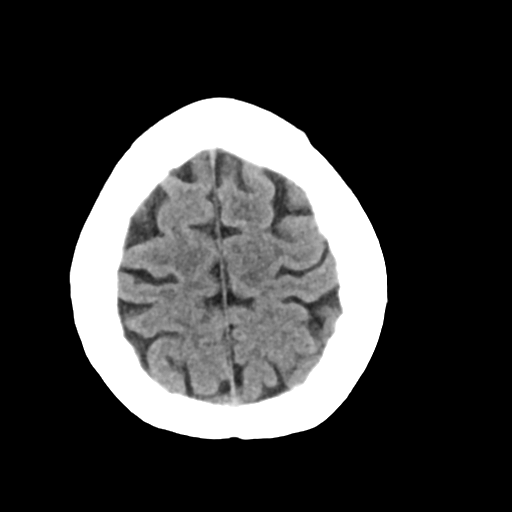
[im 26/30  brain]
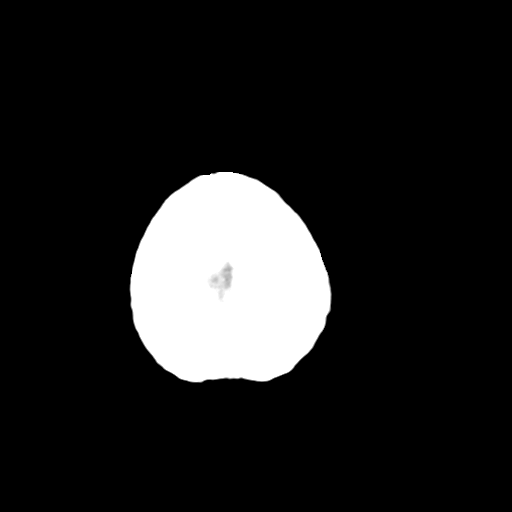

[Series 4: cor soft · coronal · 0.29mm/px · 3 of 62 slices shown]
[im 21/62  brain]
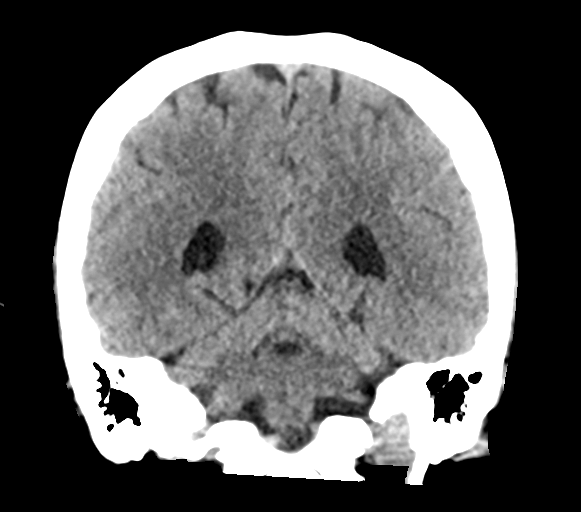
[im 28/62  brain]
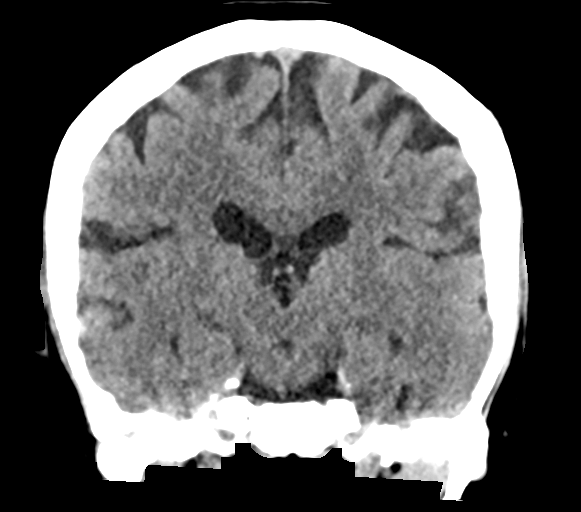
[im 34/62  brain]
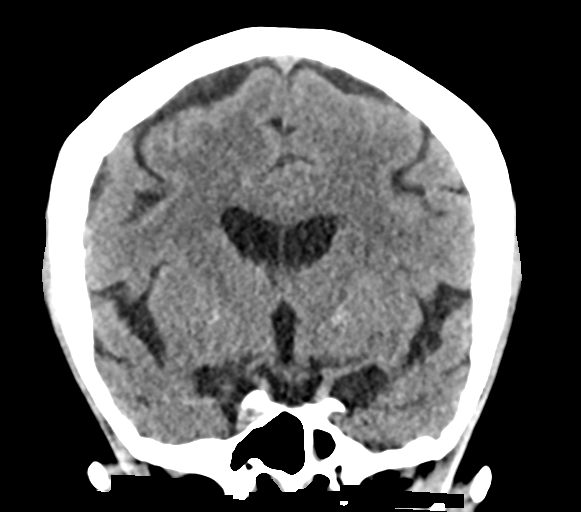

[Series 5: sag soft · sagittal · 0.29mm/px · 3 of 57 slices shown]
[im 19/57  brain]
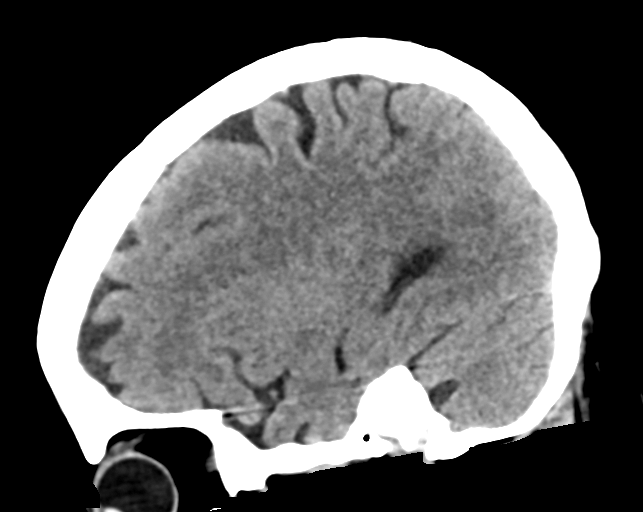
[im 29/57  brain]
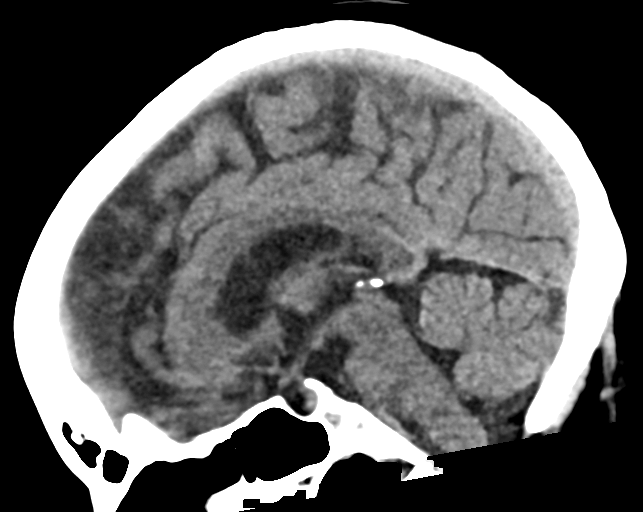
[im 38/57  brain]
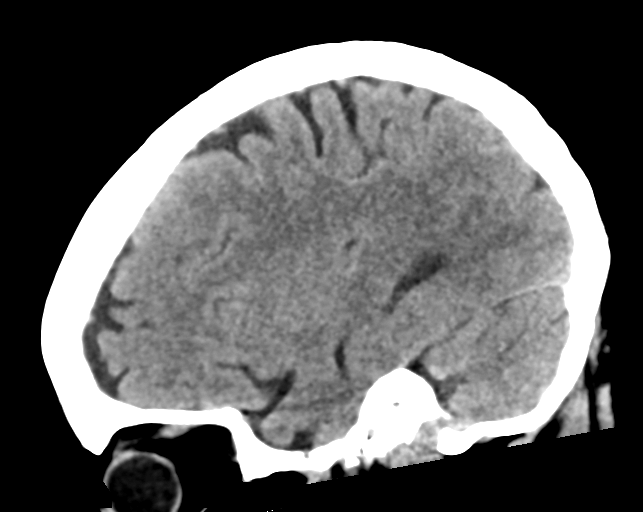

[16 of 47 positions shown; findings below may reference images not displayed]

FINDINGS: Brain: No evidence of acute infarction, hemorrhage, hydrocephalus,
extra-axial collection or mass lesion/mass effect.

Vascular: No hyperdense vessel or unexpected calcification.

Skull: Normal. Negative for fracture or focal lesion.

Sinuses/Orbits: No acute finding.

Other: None.
IMPRESSION: No acute intracranial abnormality.

## 2023-09-10 ENCOUNTER — Ambulatory Visit (INDEPENDENT_AMBULATORY_CARE_PROVIDER_SITE_OTHER): Payer: Medicare Other | Admitting: Physician Assistant

## 2023-09-10 VITALS — BP 136/80 | HR 76 | Ht 59.0 in | Wt 175.1 lb

## 2023-09-10 DIAGNOSIS — N3941 Urge incontinence: Secondary | ICD-10-CM

## 2023-09-10 LAB — MICROSCOPIC EXAMINATION: Epithelial Cells (non renal): >10 /[HPF] — AB (ref 0–10)

## 2023-09-10 LAB — URINALYSIS, COMPLETE
Bilirubin, UA: NEGATIVE
Leukocytes,UA: NEGATIVE
Nitrite, UA: NEGATIVE
Protein,UA: NEGATIVE
RBC, UA: NEGATIVE
Specific Gravity, UA: 1.015 (ref 1.005–1.030)
Urobilinogen, Ur: 0.2 mg/dL (ref 0.2–1.0)
pH, UA: 5 (ref 5.0–7.5)

## 2023-09-10 NOTE — Progress Notes (Signed)
 Patient presents to clinic today for InterStim management s/p device exchange with Dr. Gaston in November. She reports she is using lighter pads, 2 daily, still very little urinary control.  Settings on arrival: P3, 1.5 Recent falls: None Impedence check results: N/A  Program Setting to light flutter sensation Location of sensation  1 0.6 Urethra  2    3    4    5    6    7      At the conclusion of today's visit, her device was set to P1, setting 0.6. She felt a light flutter/tingle in her urethra with this.  I demonstrated how to change her program and settings and instructed her on how to proceed with 2-week trials of her program until she finds one that works well for her.  She expressed understanding.  Return in about 6 months (around 03/09/2024) for OAB/InterStim f/u with Dr. MacDiarmid.

## 2023-09-10 NOTE — Patient Instructions (Signed)
 Your Interstim Components  Your device has three components: The implant, which you can feel in your buttock The communicator, which is the white and blue flat credit card-sized device you place over your implant when you want to adjust its settings The remote, which is the [facility] cell phone device where you adjust your settings  Think of your implant as a television, the remote as your remote control where you change the settings and turn it on and off, and the communicator as the receiver the TV needs to get instructions from the remote control. Once you turn the TV on, it's going to stay on until you turn it off. If the batteries in the remote or the communicator die, the TV will keep running. That means that your device will keep working if either of the two other components are powered off, run out of battery, or are not physically with you. In fact, you only need the communicator and remote when you are adjusting your settings (or coming to clinic to get help)--otherwise you can leave them at home!  How to Adjust Your Interstim Settings  Today we set your device to Program 1, Intensity 0.6.  Your device has 7 different programs, and you may need to try a few of them to achieve maximum results. Once you find a program that is working well for you, you may stay on it.  It takes time after changing the program to see results. Every time you change the program, you should plan to stay on that program for 2 full weeks before moving on to the next one. After the first week, if your symptoms are no better, then you may increase the intensity--but keep the program the same. If after 2 weeks on the same program you still notice no difference, then you may switch to a different program and repeat the process.

## 2023-09-14 ENCOUNTER — Other Ambulatory Visit: Payer: Self-pay | Admitting: Family Medicine

## 2023-09-14 IMAGING — CT CT ANGIO HEAD-NECK (W OR W/O PERF)
2 of 11 series · 7 of 33 positions shown · non-contrast
Comparison: Head CT 01/19/2022.  Head and neck CTA 10/04/2021.

CLINICAL DATA: Neuro deficit, acute, stroke suspected. Nausea,
vomiting, and generalized weakness.

EXAM:
CT ANGIOGRAPHY HEAD AND NECK
TECHNIQUE: Multidetector CT imaging of the head and neck was performed using
the standard protocol during bolus administration of intravenous
contrast. Multiplanar CT image reconstructions and MIPs were
obtained to evaluate the vascular anatomy. Carotid stenosis
measurements (when applicable) are obtained utilizing NASCET
criteria, using the distal internal carotid diameter as the
denominator.

[Series 7: cta neck/head · axial · 0.56mm/px · z∈[-322,-212]mm · 2 of 167 slices shown]
[im 56/167  soft-tissue]
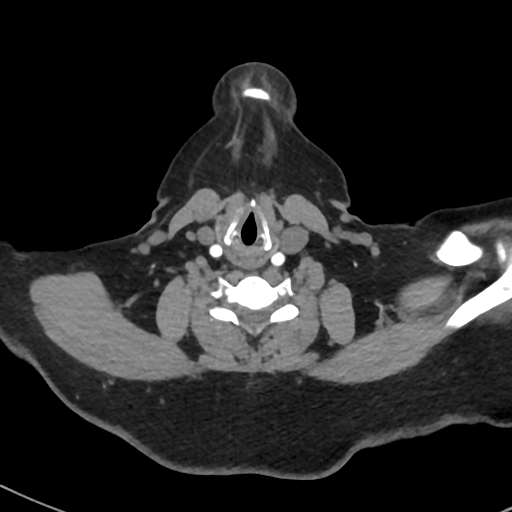
[im 111/167  soft-tissue]
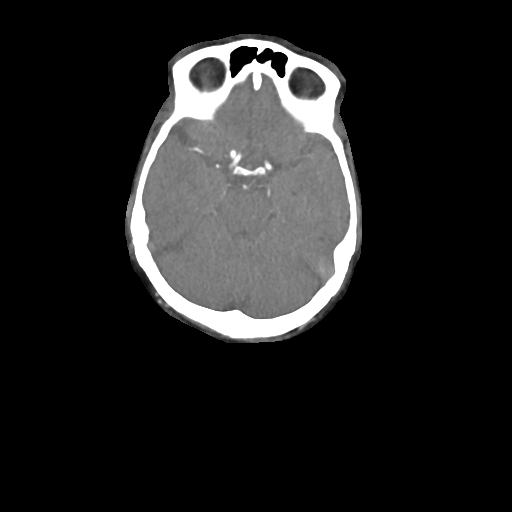

[Series 12: ax thins · axial · 0.39mm/px · z∈[-378,-162]mm · 5 of 326 slices shown]
[im 55/326  soft-tissue]
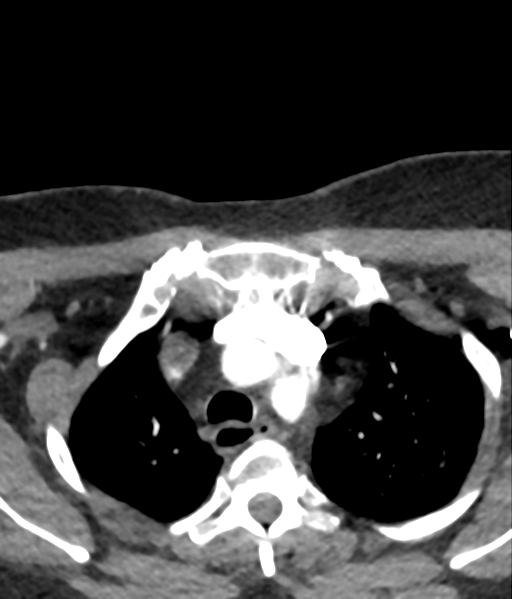
[im 109/326  bone]
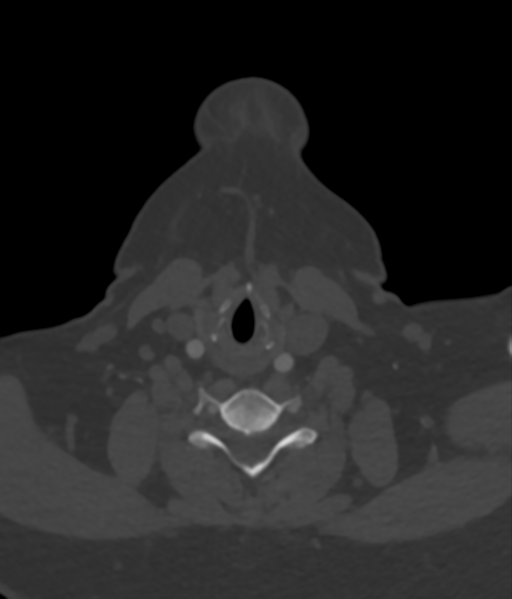
[im 163/326  soft-tissue]
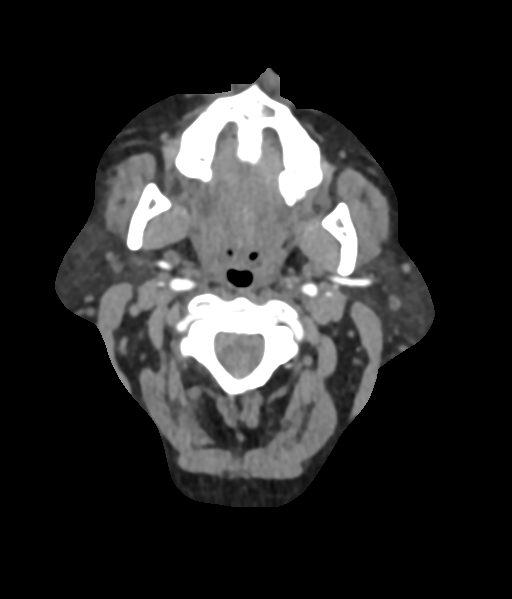
[im 217/326  bone]
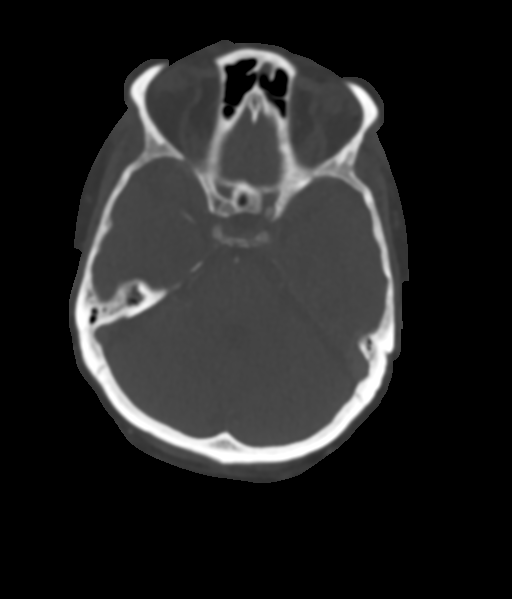
[im 271/326  soft-tissue]
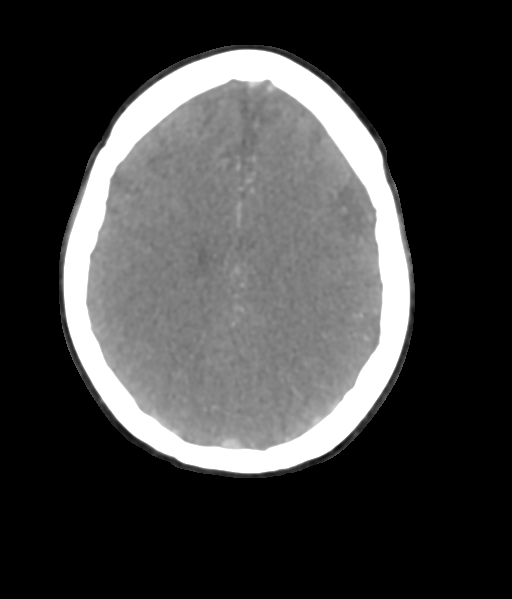

[7 of 33 positions shown; findings below may reference images not displayed]

RADIATION DOSE REDUCTION: This exam was performed according to the
departmental dose-optimization program which includes automated
exposure control, adjustment of the mA and/or kV according to
patient size and/or use of iterative reconstruction technique.

CONTRAST:  75mL OMNIPAQUE IOHEXOL 350 MG/ML SOLN
FINDINGS: CT HEAD FINDINGS

Brain: There is no evidence of an acute infarct, intracranial
hemorrhage, mass, midline shift, or extra-axial fluid collection.
The ventricles and sulci are within normal limits for age.
Hypodensities in the cerebral white matter bilaterally are unchanged
and nonspecific but compatible with minimal chronic small vessel
ischemic disease.

Vascular: No hyperdense vessel.

Skull: No fracture or suspicious osseous lesion.

Sinuses: Mucous retention cysts in the maxillary sinuses. Mild
mucosal thickening in the left sphenoid sinus. Trace right mastoid
effusion.

Orbits: Unremarkable.

Review of the MIP images confirms the above findings

CTA NECK FINDINGS

Aortic arch: Normal variant aortic arch branching pattern with
common origin of the brachiocephalic and left common carotid
arteries. Widely patent arch vessel origins.

Right carotid system: Patent without evidence of stenosis or
dissection.

Left carotid system: Patent without evidence of stenosis or
dissection.

Vertebral arteries: Patent and codominant without evidence of
stenosis or dissection.

Skeleton: No acute osseous abnormality or suspicious osseous lesion.
Focally advanced facet arthrosis on the right at C4-5.

Other neck: No evidence of cervical lymphadenopathy or mass.

Upper chest: No apical lung consolidation or mass.

Review of the MIP images confirms the above findings

CTA HEAD FINDINGS

Anterior circulation: The internal carotid arteries are widely
patent from skull base to carotid termini. ACAs and MCAs are patent
without evidence of a proximal branch occlusion or flow limiting
proximal stenosis. There is mild branch vessel irregularity
bilaterally. No aneurysm is identified.

Posterior circulation: The intracranial vertebral arteries are
widely patent to the basilar. Patent PICA, AICA, and SCA origins are
seen bilaterally. The basilar artery is widely patent. There are
large left and diminutive or absent right posterior communicating
arteries with hypoplasia of the left P1 segment. Both PCAs are
patent without evidence of a significant proximal stenosis. No
aneurysm is identified.

Venous sinuses: As permitted by contrast timing, patent.

Anatomic variants: Predominantly fetal type origin of the left PCA.

Review of the MIP images confirms the above findings
IMPRESSION: 1. No evidence of acute intracranial abnormality.
2. No large vessel occlusion or significant proximal stenosis in the
head or neck.

## 2023-09-15 IMAGING — CT CT HEAD W/O CM
4 of 6 series · 17 of 47 positions shown, 18 images · non-contrast
Comparison: 01/19/2022

CLINICAL DATA: Vertigo, neurological deficit



[Series 2: head bone · axial · 0.39mm/px · z∈[-121,-19]mm · 7 of 73 slices shown]
[im 6/73  bone]
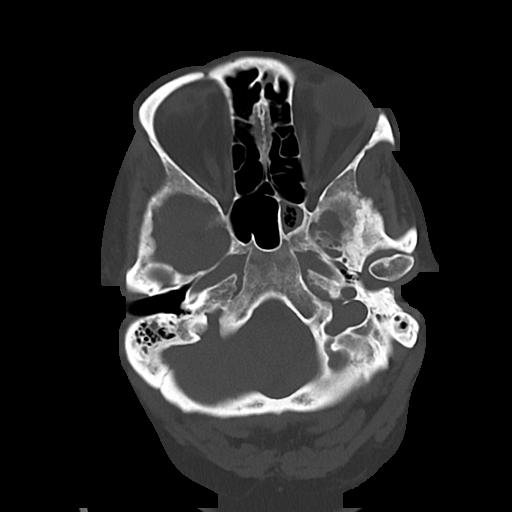
[im 16/73  bone]
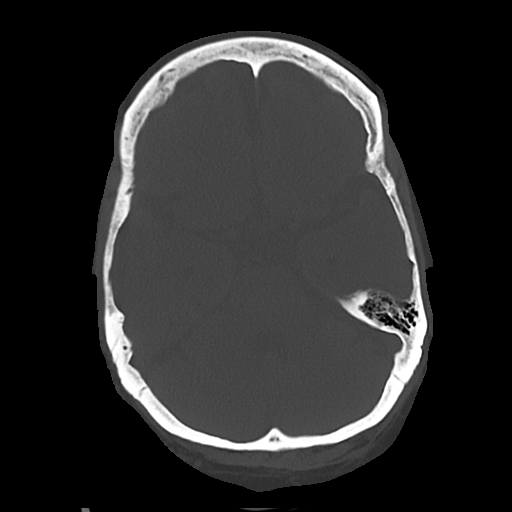
[im 26/73  bone]
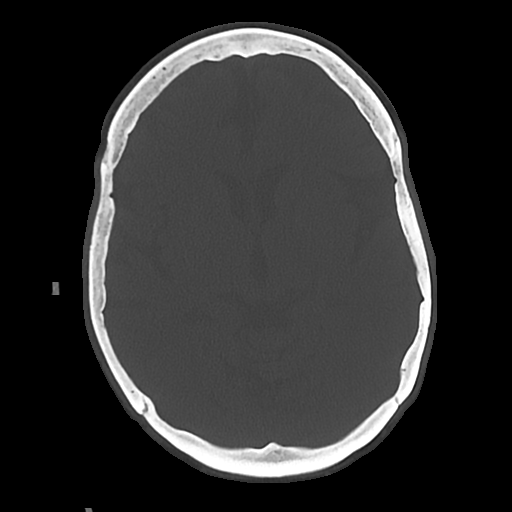
[im 31/73  bone]
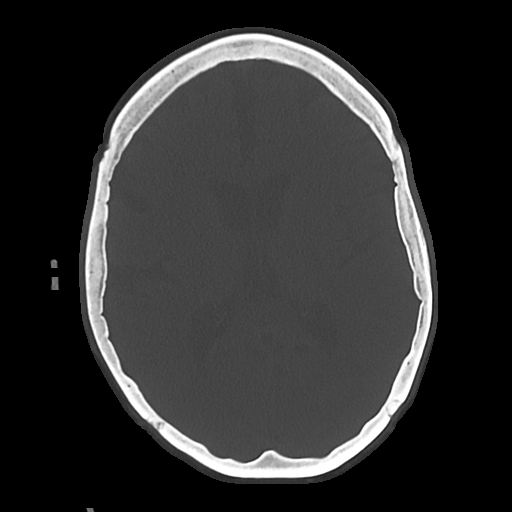
[im 42/73  bone]
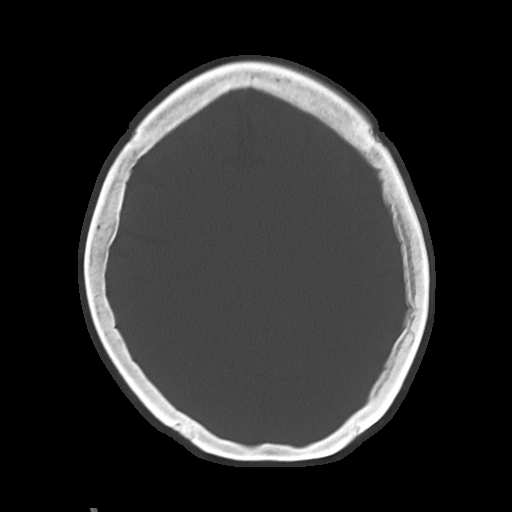
[im 47/73  bone]
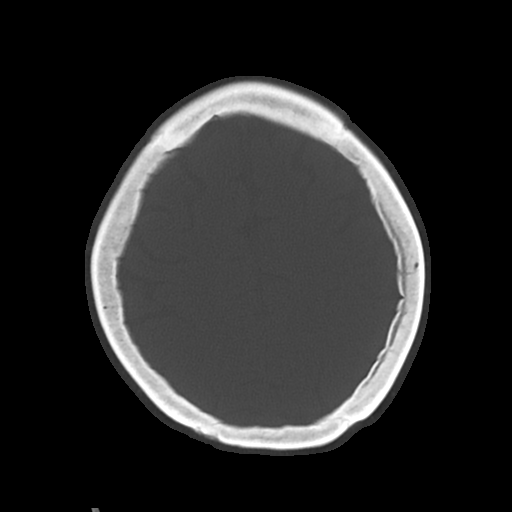
[im 57/73  bone]
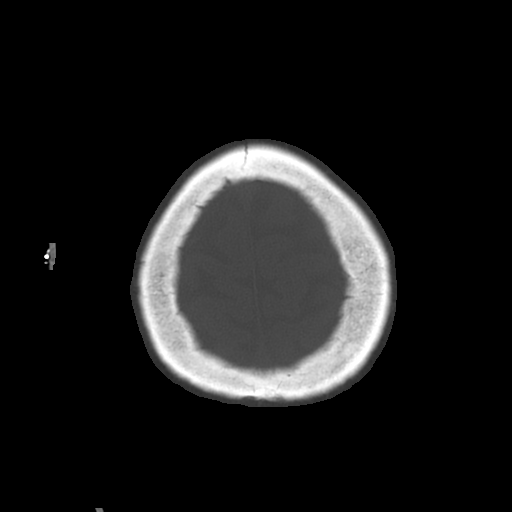

[Series 3: head wo · axial · 0.39mm/px · z∈[-106,-21]mm · 4 of 29 slices shown, 5 images]
[im 6/29  brain]
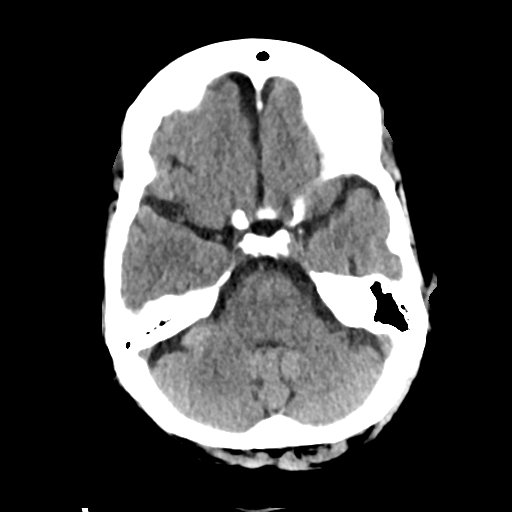
[im 6/29  bone]
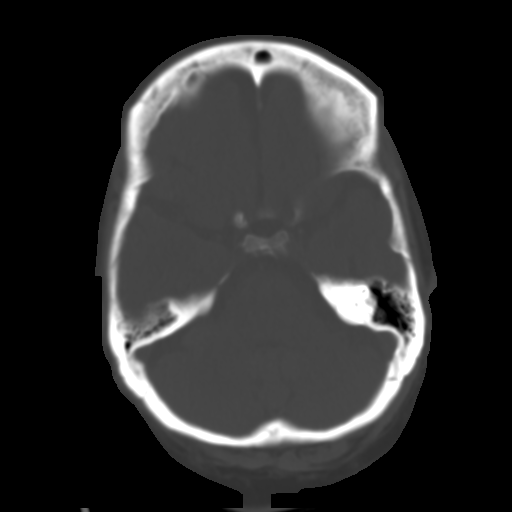
[im 12/29  brain]
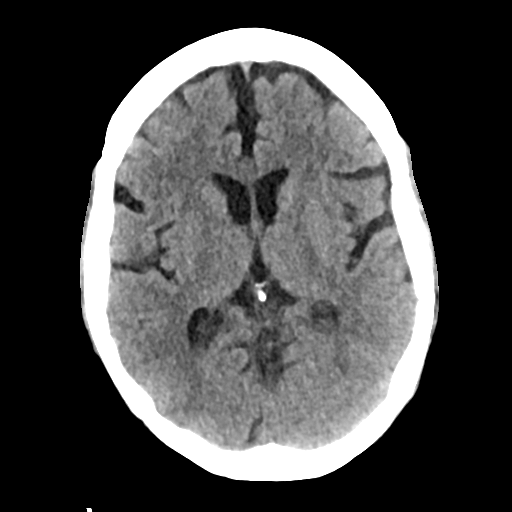
[im 17/29  brain]
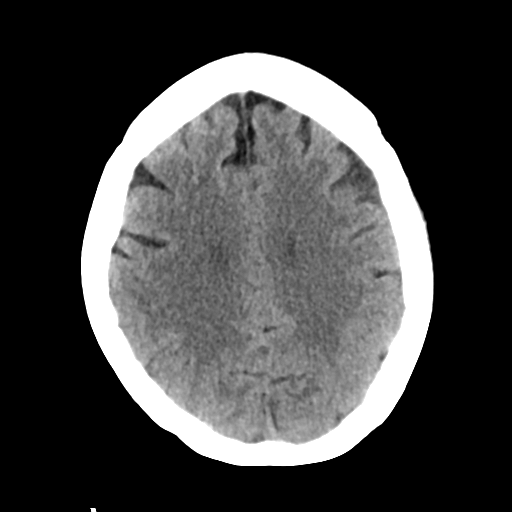
[im 23/29  brain]
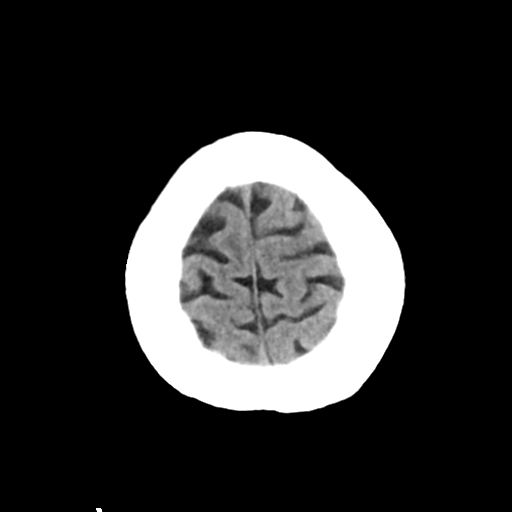

[Series 4: cor soft · coronal · 0.33mm/px · 3 of 67 slices shown]
[im 23/67  brain]
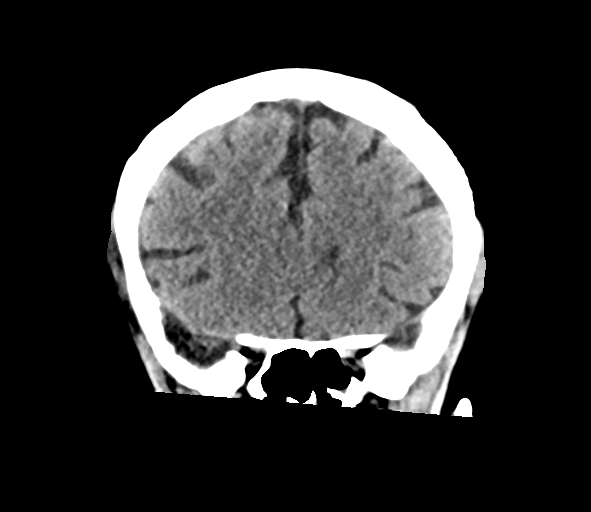
[im 30/67  brain]
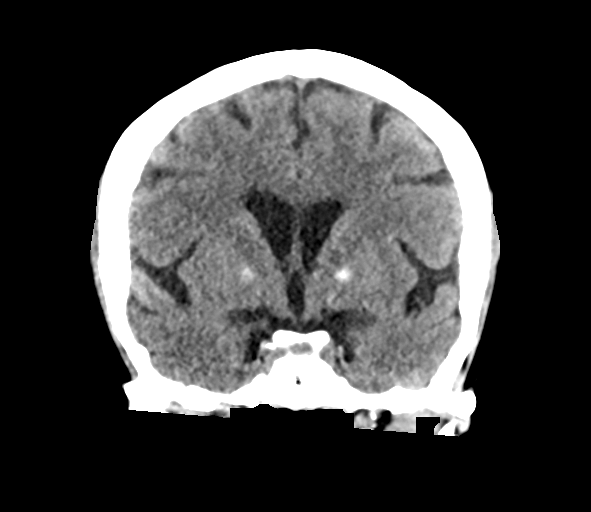
[im 37/67  brain]
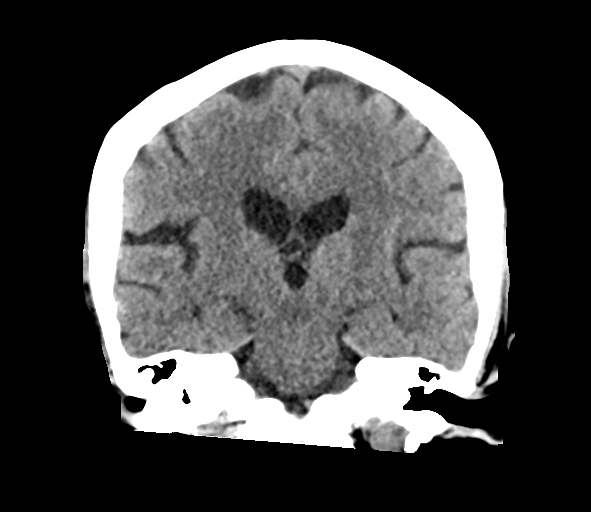

[Series 5: sag soft · sagittal · 0.33mm/px · 3 of 52 slices shown]
[im 18/52  brain]
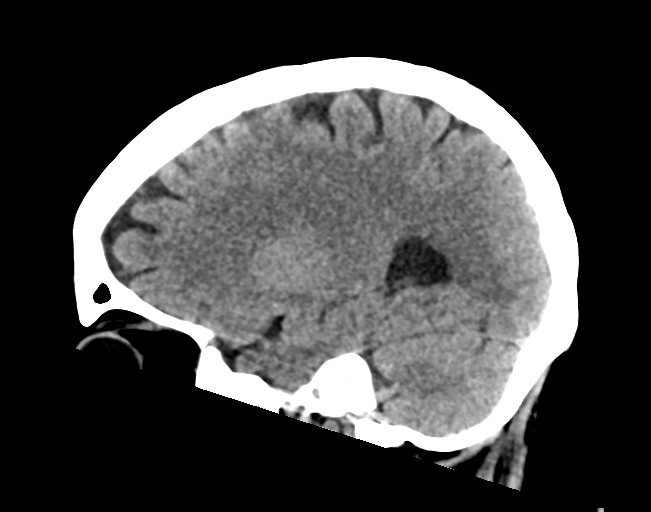
[im 26/52  brain]
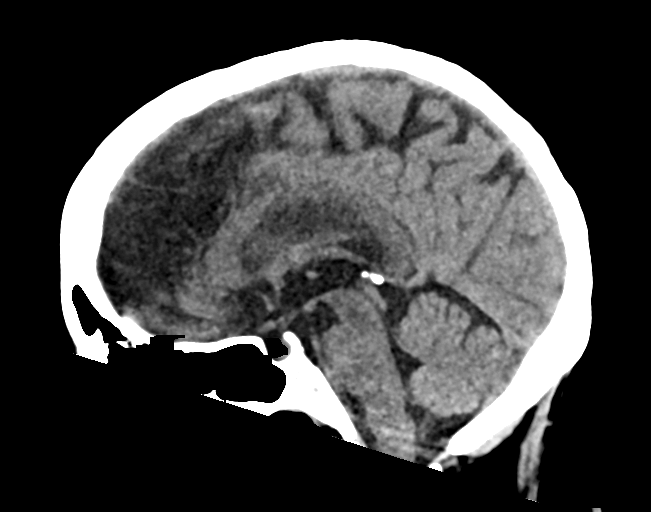
[im 35/52  brain]
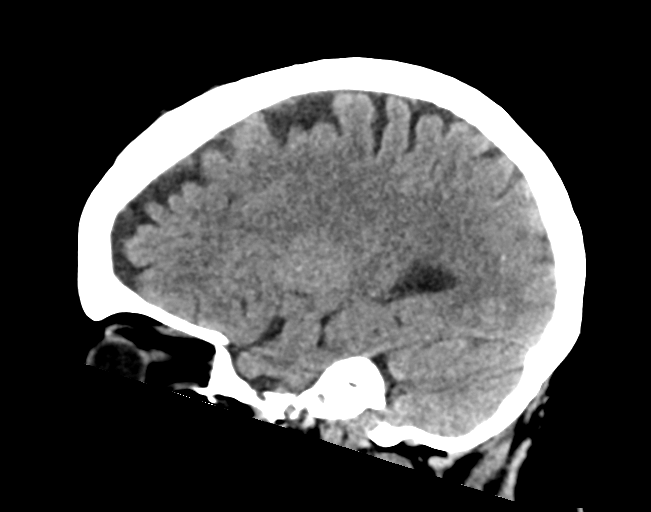

[17 of 47 positions shown; findings below may reference images not displayed]

FINDINGS: Brain: No acute intracranial findings are seen. There are no signs
of bleeding within the cranium. Calcifications are seen in the basal
ganglia. Cortical sulci are prominent.

Vascular: Unremarkable.

Skull: No fracture is seen in the calvarium.

Sinuses/Orbits: There is mucosal thickening in the left side of
sphenoid sinus.

Other: No significant interval changes are noted.
IMPRESSION: No acute intracranial findings are seen in noncontrast CT brain.
Atrophy.

Chronic sphenoid sinusitis.

## 2023-09-18 ENCOUNTER — Other Ambulatory Visit: Payer: Self-pay

## 2023-09-18 ENCOUNTER — Emergency Department: Payer: Medicare Other

## 2023-09-18 ENCOUNTER — Inpatient Hospital Stay
Admission: EM | Admit: 2023-09-18 | Discharge: 2023-09-22 | DRG: 871 | Disposition: A | Discharge status: Home / Self Care | Payer: Medicare Other | Attending: Student | Admitting: Student

## 2023-09-18 ENCOUNTER — Ambulatory Visit: Payer: Medicare Other | Admitting: Family Medicine

## 2023-09-18 DIAGNOSIS — R197 Diarrhea, unspecified: Secondary | ICD-10-CM

## 2023-09-18 DIAGNOSIS — R519 Headache, unspecified: Secondary | ICD-10-CM | POA: Diagnosis not present

## 2023-09-18 DIAGNOSIS — R1084 Generalized abdominal pain: Secondary | ICD-10-CM

## 2023-09-18 DIAGNOSIS — E1129 Type 2 diabetes mellitus with other diabetic kidney complication: Secondary | ICD-10-CM | POA: Diagnosis present

## 2023-09-18 DIAGNOSIS — E1140 Type 2 diabetes mellitus with diabetic neuropathy, unspecified: Secondary | ICD-10-CM | POA: Diagnosis not present

## 2023-09-18 DIAGNOSIS — I5032 Chronic diastolic (congestive) heart failure: Secondary | ICD-10-CM | POA: Diagnosis not present

## 2023-09-18 DIAGNOSIS — Z880 Allergy status to penicillin: Secondary | ICD-10-CM

## 2023-09-18 DIAGNOSIS — N3000 Acute cystitis without hematuria: Secondary | ICD-10-CM | POA: Diagnosis not present

## 2023-09-18 DIAGNOSIS — E1122 Type 2 diabetes mellitus with diabetic chronic kidney disease: Secondary | ICD-10-CM | POA: Diagnosis not present

## 2023-09-18 DIAGNOSIS — Z841 Family history of disorders of kidney and ureter: Secondary | ICD-10-CM

## 2023-09-18 DIAGNOSIS — R8281 Pyuria: Secondary | ICD-10-CM | POA: Diagnosis not present

## 2023-09-18 DIAGNOSIS — F250 Schizoaffective disorder, bipolar type: Secondary | ICD-10-CM | POA: Diagnosis not present

## 2023-09-18 DIAGNOSIS — I252 Old myocardial infarction: Secondary | ICD-10-CM

## 2023-09-18 DIAGNOSIS — Z8619 Personal history of other infectious and parasitic diseases: Secondary | ICD-10-CM

## 2023-09-18 DIAGNOSIS — R42 Dizziness and giddiness: Secondary | ICD-10-CM | POA: Diagnosis not present

## 2023-09-18 DIAGNOSIS — E1151 Type 2 diabetes mellitus with diabetic peripheral angiopathy without gangrene: Secondary | ICD-10-CM | POA: Diagnosis not present

## 2023-09-18 DIAGNOSIS — I130 Hypertensive heart and chronic kidney disease with heart failure and stage 1 through stage 4 chronic kidney disease, or unspecified chronic kidney disease: Secondary | ICD-10-CM | POA: Diagnosis present

## 2023-09-18 DIAGNOSIS — Z83438 Family history of other disorder of lipoprotein metabolism and other lipidemia: Secondary | ICD-10-CM

## 2023-09-18 DIAGNOSIS — E039 Hypothyroidism, unspecified: Secondary | ICD-10-CM | POA: Diagnosis not present

## 2023-09-18 DIAGNOSIS — A419 Sepsis, unspecified organism: Secondary | ICD-10-CM | POA: Diagnosis not present

## 2023-09-18 DIAGNOSIS — N183 Chronic kidney disease, stage 3 unspecified: Secondary | ICD-10-CM | POA: Diagnosis present

## 2023-09-18 DIAGNOSIS — Z881 Allergy status to other antibiotic agents status: Secondary | ICD-10-CM

## 2023-09-18 DIAGNOSIS — Z8701 Personal history of pneumonia (recurrent): Secondary | ICD-10-CM

## 2023-09-18 DIAGNOSIS — E876 Hypokalemia: Secondary | ICD-10-CM | POA: Diagnosis not present

## 2023-09-18 DIAGNOSIS — E860 Dehydration: Secondary | ICD-10-CM | POA: Diagnosis present

## 2023-09-18 DIAGNOSIS — R109 Unspecified abdominal pain: Secondary | ICD-10-CM | POA: Diagnosis not present

## 2023-09-18 DIAGNOSIS — E785 Hyperlipidemia, unspecified: Secondary | ICD-10-CM | POA: Diagnosis not present

## 2023-09-18 DIAGNOSIS — E669 Obesity, unspecified: Secondary | ICD-10-CM | POA: Diagnosis not present

## 2023-09-18 DIAGNOSIS — E538 Deficiency of other specified B group vitamins: Secondary | ICD-10-CM | POA: Diagnosis present

## 2023-09-18 DIAGNOSIS — R6520 Severe sepsis without septic shock: Secondary | ICD-10-CM | POA: Diagnosis not present

## 2023-09-18 DIAGNOSIS — Z860101 Personal history of adenomatous and serrated colon polyps: Secondary | ICD-10-CM

## 2023-09-18 DIAGNOSIS — Z8616 Personal history of COVID-19: Secondary | ICD-10-CM | POA: Diagnosis not present

## 2023-09-18 DIAGNOSIS — M1990 Unspecified osteoarthritis, unspecified site: Secondary | ICD-10-CM | POA: Diagnosis present

## 2023-09-18 DIAGNOSIS — I2510 Atherosclerotic heart disease of native coronary artery without angina pectoris: Secondary | ICD-10-CM | POA: Diagnosis present

## 2023-09-18 DIAGNOSIS — R112 Nausea with vomiting, unspecified: Secondary | ICD-10-CM | POA: Diagnosis not present

## 2023-09-18 DIAGNOSIS — G4733 Obstructive sleep apnea (adult) (pediatric): Secondary | ICD-10-CM | POA: Diagnosis not present

## 2023-09-18 DIAGNOSIS — Z823 Family history of stroke: Secondary | ICD-10-CM

## 2023-09-18 DIAGNOSIS — Z90710 Acquired absence of both cervix and uterus: Secondary | ICD-10-CM

## 2023-09-18 DIAGNOSIS — J449 Chronic obstructive pulmonary disease, unspecified: Secondary | ICD-10-CM | POA: Diagnosis present

## 2023-09-18 DIAGNOSIS — Z7984 Long term (current) use of oral hypoglycemic drugs: Secondary | ICD-10-CM

## 2023-09-18 DIAGNOSIS — Z833 Family history of diabetes mellitus: Secondary | ICD-10-CM

## 2023-09-18 DIAGNOSIS — Z8249 Family history of ischemic heart disease and other diseases of the circulatory system: Secondary | ICD-10-CM

## 2023-09-18 DIAGNOSIS — R6521 Severe sepsis with septic shock: Secondary | ICD-10-CM | POA: Diagnosis present

## 2023-09-18 DIAGNOSIS — N1831 Chronic kidney disease, stage 3a: Secondary | ICD-10-CM | POA: Diagnosis not present

## 2023-09-18 DIAGNOSIS — G2581 Restless legs syndrome: Secondary | ICD-10-CM | POA: Diagnosis not present

## 2023-09-18 DIAGNOSIS — Z8673 Personal history of transient ischemic attack (TIA), and cerebral infarction without residual deficits: Secondary | ICD-10-CM

## 2023-09-18 DIAGNOSIS — K5900 Constipation, unspecified: Secondary | ICD-10-CM | POA: Diagnosis present

## 2023-09-18 DIAGNOSIS — R81 Glycosuria: Secondary | ICD-10-CM | POA: Diagnosis present

## 2023-09-18 DIAGNOSIS — A4151 Sepsis due to Escherichia coli [E. coli]: Principal | ICD-10-CM | POA: Diagnosis present

## 2023-09-18 DIAGNOSIS — K7581 Nonalcoholic steatohepatitis (NASH): Secondary | ICD-10-CM | POA: Diagnosis not present

## 2023-09-18 DIAGNOSIS — K219 Gastro-esophageal reflux disease without esophagitis: Secondary | ICD-10-CM | POA: Diagnosis present

## 2023-09-18 DIAGNOSIS — A044 Other intestinal Escherichia coli infections: Secondary | ICD-10-CM | POA: Diagnosis not present

## 2023-09-18 DIAGNOSIS — Z885 Allergy status to narcotic agent status: Secondary | ICD-10-CM

## 2023-09-18 DIAGNOSIS — Z794 Long term (current) use of insulin: Secondary | ICD-10-CM | POA: Diagnosis not present

## 2023-09-18 DIAGNOSIS — R000 Tachycardia, unspecified: Secondary | ICD-10-CM | POA: Diagnosis not present

## 2023-09-18 DIAGNOSIS — I959 Hypotension, unspecified: Secondary | ICD-10-CM | POA: Diagnosis not present

## 2023-09-18 DIAGNOSIS — N179 Acute kidney failure, unspecified: Secondary | ICD-10-CM | POA: Diagnosis present

## 2023-09-18 DIAGNOSIS — Z8261 Family history of arthritis: Secondary | ICD-10-CM

## 2023-09-18 DIAGNOSIS — R296 Repeated falls: Secondary | ICD-10-CM | POA: Diagnosis present

## 2023-09-18 DIAGNOSIS — Z79899 Other long term (current) drug therapy: Secondary | ICD-10-CM

## 2023-09-18 DIAGNOSIS — E66812 Obesity, class 2: Secondary | ICD-10-CM | POA: Diagnosis present

## 2023-09-18 DIAGNOSIS — Z882 Allergy status to sulfonamides status: Secondary | ICD-10-CM

## 2023-09-18 DIAGNOSIS — J4489 Other specified chronic obstructive pulmonary disease: Secondary | ICD-10-CM | POA: Diagnosis not present

## 2023-09-18 DIAGNOSIS — E872 Acidosis, unspecified: Secondary | ICD-10-CM | POA: Diagnosis present

## 2023-09-18 DIAGNOSIS — Z79890 Hormone replacement therapy (postmenopausal): Secondary | ICD-10-CM

## 2023-09-18 DIAGNOSIS — Z6835 Body mass index (BMI) 35.0-35.9, adult: Secondary | ICD-10-CM

## 2023-09-18 DIAGNOSIS — Z888 Allergy status to other drugs, medicaments and biological substances status: Secondary | ICD-10-CM

## 2023-09-18 DIAGNOSIS — Z8711 Personal history of peptic ulcer disease: Secondary | ICD-10-CM

## 2023-09-18 DIAGNOSIS — Z818 Family history of other mental and behavioral disorders: Secondary | ICD-10-CM

## 2023-09-18 DIAGNOSIS — R1111 Vomiting without nausea: Secondary | ICD-10-CM | POA: Diagnosis not present

## 2023-09-18 DIAGNOSIS — F419 Anxiety disorder, unspecified: Secondary | ICD-10-CM | POA: Diagnosis present

## 2023-09-18 DIAGNOSIS — G238 Other specified degenerative diseases of basal ganglia: Secondary | ICD-10-CM | POA: Diagnosis not present

## 2023-09-18 LAB — COMPREHENSIVE METABOLIC PANEL
ALT: 43 U/L (ref 0–44)
AST: 61 U/L — ABNORMAL HIGH (ref 15–41)
Albumin: 3.1 g/dL — ABNORMAL LOW (ref 3.5–5.0)
Alkaline Phosphatase: 37 U/L — ABNORMAL LOW (ref 38–126)
Anion gap: 13 (ref 5–15)
BUN: 52 mg/dL — ABNORMAL HIGH (ref 6–20)
CO2: 24 mmol/L (ref 22–32)
Calcium: 7.9 mg/dL — ABNORMAL LOW (ref 8.9–10.3)
Chloride: 102 mmol/L (ref 98–111)
Creatinine, Ser: 1.28 mg/dL — ABNORMAL HIGH (ref 0.44–1.00)
GFR, Estimated: 48 mL/min — ABNORMAL LOW (ref >60)
Glucose, Bld: 144 mg/dL — ABNORMAL HIGH (ref 70–99)
Potassium: 3.5 mmol/L (ref 3.5–5.1)
Sodium: 139 mmol/L (ref 135–145)
Total Bilirubin: 0.4 mg/dL (ref 0.0–1.2)
Total Protein: 5.7 g/dL — ABNORMAL LOW (ref 6.5–8.1)

## 2023-09-18 LAB — CBC
HCT: 42.7 % (ref 36.0–46.0)
Hemoglobin: 13.9 g/dL (ref 12.0–15.0)
MCH: 29 pg (ref 26.0–34.0)
MCHC: 32.6 g/dL (ref 30.0–36.0)
MCV: 89 fL (ref 80.0–100.0)
Platelets: 243 10*3/uL (ref 150–400)
RBC: 4.8 MIL/uL (ref 3.87–5.11)
RDW: 13.2 % (ref 11.5–15.5)
WBC: 5.6 10*3/uL (ref 4.0–10.5)
nRBC: 0 % (ref 0.0–0.2)

## 2023-09-18 LAB — URINALYSIS, ROUTINE W REFLEX MICROSCOPIC
Bilirubin Urine: NEGATIVE
Glucose, UA: >=500 mg/dL — AB
Hgb urine dipstick: NEGATIVE
Ketones, ur: NEGATIVE mg/dL
Leukocytes,Ua: NEGATIVE
Nitrite: NEGATIVE
Protein, ur: NEGATIVE mg/dL
Specific Gravity, Urine: >1.046 — ABNORMAL HIGH (ref 1.005–1.030)
pH: 5 (ref 5.0–8.0)

## 2023-09-18 LAB — RESP PANEL BY RT-PCR (RSV, FLU A&B, COVID)  RVPGX2
Influenza A by PCR: NEGATIVE
Influenza B by PCR: NEGATIVE
Resp Syncytial Virus by PCR: NEGATIVE
SARS Coronavirus 2 by RT PCR: NEGATIVE

## 2023-09-18 LAB — C DIFFICILE QUICK SCREEN W PCR REFLEX
C Diff antigen: NEGATIVE
C Diff interpretation: NOT DETECTED
C Diff toxin: NEGATIVE

## 2023-09-18 LAB — LIPASE, BLOOD: Lipase: 51 U/L (ref 11–51)

## 2023-09-18 MED ORDER — ONDANSETRON HCL 4 MG/2ML IJ SOLN
4.0000 mg | Freq: Once | INTRAMUSCULAR | Status: AC
  Administered 2023-09-18: 4 mg via INTRAVENOUS
  Filled 2023-09-18: qty 2

## 2023-09-18 MED ORDER — SODIUM CHLORIDE 0.9 % IV BOLUS
1000.0000 mL | Freq: Once | INTRAVENOUS | Status: AC
  Administered 2023-09-18: 1000 mL via INTRAVENOUS

## 2023-09-18 MED ORDER — ONDANSETRON 4 MG PO TBDP
4.0000 mg | ORAL_TABLET | Freq: Once | ORAL | Status: AC
  Administered 2023-09-18: 4 mg via ORAL
  Filled 2023-09-18: qty 1

## 2023-09-18 MED ORDER — GABAPENTIN 300 MG PO CAPS
600.0000 mg | ORAL_CAPSULE | Freq: Every day | ORAL | Status: DC
  Administered 2023-09-19 – 2023-09-21 (×4): 600 mg via ORAL
  Filled 2023-09-18 (×4): qty 2

## 2023-09-18 MED ORDER — ENOXAPARIN SODIUM 40 MG/0.4ML IJ SOSY
0.5000 mg/kg | PREFILLED_SYRINGE | INTRAMUSCULAR | Status: DC
  Administered 2023-09-19 – 2023-09-21 (×4): 40 mg via SUBCUTANEOUS
  Filled 2023-09-18 (×4): qty 0.4

## 2023-09-18 MED ORDER — ACETAMINOPHEN 325 MG PO TABS
650.0000 mg | ORAL_TABLET | Freq: Once | ORAL | Status: AC
  Administered 2023-09-18: 650 mg via ORAL
  Filled 2023-09-18: qty 2

## 2023-09-18 MED ORDER — IOHEXOL 300 MG/ML  SOLN
100.0000 mL | Freq: Once | INTRAMUSCULAR | Status: AC | PRN
  Administered 2023-09-18: 100 mL via INTRAVENOUS

## 2023-09-18 MED ORDER — CITALOPRAM HYDROBROMIDE 10 MG PO TABS
20.0000 mg | ORAL_TABLET | ORAL | Status: DC
  Administered 2023-09-19 – 2023-09-22 (×4): 20 mg via ORAL
  Filled 2023-09-18: qty 2
  Filled 2023-09-18 (×2): qty 1
  Filled 2023-09-18: qty 2

## 2023-09-18 MED ORDER — ACETAMINOPHEN 10 MG/ML IV SOLN
1000.0000 mg | Freq: Once | INTRAVENOUS | Status: AC
  Administered 2023-09-19: 1000 mg via INTRAVENOUS
  Filled 2023-09-18: qty 100

## 2023-09-18 MED ORDER — SODIUM CHLORIDE 0.9 % IV SOLN
1.0000 g | Freq: Once | INTRAVENOUS | Status: DC
  Filled 2023-09-18: qty 10

## 2023-09-18 MED ORDER — LEVOFLOXACIN IN D5W 500 MG/100ML IV SOLN
500.0000 mg | Freq: Once | INTRAVENOUS | Status: AC
  Administered 2023-09-19: 500 mg via INTRAVENOUS
  Filled 2023-09-18: qty 100

## 2023-09-18 MED ORDER — BUSPIRONE HCL 10 MG PO TABS
30.0000 mg | ORAL_TABLET | Freq: Two times a day (BID) | ORAL | Status: DC
  Administered 2023-09-19 – 2023-09-22 (×8): 30 mg via ORAL
  Filled 2023-09-18: qty 3
  Filled 2023-09-18 (×2): qty 6
  Filled 2023-09-18: qty 3
  Filled 2023-09-18: qty 6
  Filled 2023-09-18: qty 3
  Filled 2023-09-18: qty 6
  Filled 2023-09-18: qty 3

## 2023-09-18 MED ORDER — LACTATED RINGERS IV SOLN
INTRAVENOUS | Status: AC
Start: 2023-09-18 — End: 2023-09-19

## 2023-09-18 MED ORDER — INSULIN GLARGINE-YFGN 100 UNIT/ML ~~LOC~~ SOLN
20.0000 [IU] | Freq: Every day | SUBCUTANEOUS | Status: DC
  Administered 2023-09-19 – 2023-09-21 (×4): 20 [IU] via SUBCUTANEOUS
  Filled 2023-09-18 (×5): qty 0.2

## 2023-09-18 MED ORDER — ONDANSETRON HCL 4 MG/2ML IJ SOLN
4.0000 mg | Freq: Four times a day (QID) | INTRAMUSCULAR | Status: DC | PRN
  Administered 2023-09-19 – 2023-09-20 (×3): 4 mg via INTRAVENOUS
  Filled 2023-09-18 (×3): qty 2

## 2023-09-18 MED ORDER — INSULIN ASPART 100 UNIT/ML IJ SOLN
0.0000 [IU] | Freq: Three times a day (TID) | INTRAMUSCULAR | Status: DC
Start: 2023-09-19 — End: 2023-09-22
  Administered 2023-09-19: 3 [IU] via SUBCUTANEOUS
  Administered 2023-09-19: 4 [IU] via SUBCUTANEOUS
  Administered 2023-09-19: 7 [IU] via SUBCUTANEOUS
  Administered 2023-09-20 – 2023-09-21 (×3): 3 [IU] via SUBCUTANEOUS
  Administered 2023-09-21: 4 [IU] via SUBCUTANEOUS
  Administered 2023-09-21 – 2023-09-22 (×2): 3 [IU] via SUBCUTANEOUS
  Filled 2023-09-18 (×9): qty 1

## 2023-09-18 MED ORDER — ATORVASTATIN CALCIUM 80 MG PO TABS
80.0000 mg | ORAL_TABLET | Freq: Every day | ORAL | Status: DC
  Administered 2023-09-19 – 2023-09-21 (×4): 80 mg via ORAL
  Filled 2023-09-18: qty 1
  Filled 2023-09-18: qty 4
  Filled 2023-09-18: qty 1
  Filled 2023-09-18: qty 4

## 2023-09-18 MED ORDER — LORATADINE 10 MG PO TABS
10.0000 mg | ORAL_TABLET | Freq: Every day | ORAL | Status: DC
  Administered 2023-09-19 – 2023-09-22 (×4): 10 mg via ORAL
  Filled 2023-09-18 (×4): qty 1

## 2023-09-18 MED ORDER — ACETAMINOPHEN 650 MG RE SUPP: 650.0000 mg | Freq: Four times a day (QID) | RECTAL | Status: DC | PRN

## 2023-09-18 MED ORDER — LEVOTHYROXINE SODIUM 88 MCG PO TABS
88.0000 ug | ORAL_TABLET | Freq: Every day | ORAL | Status: DC
  Administered 2023-09-19 – 2023-09-22 (×4): 88 ug via ORAL
  Filled 2023-09-18 (×4): qty 1

## 2023-09-18 MED ORDER — SODIUM CHLORIDE 0.9% FLUSH
3.0000 mL | Freq: Two times a day (BID) | INTRAVENOUS | Status: DC
  Administered 2023-09-19 – 2023-09-22 (×6): 3 mL via INTRAVENOUS

## 2023-09-18 MED ORDER — ACETAMINOPHEN 325 MG PO TABS
650.0000 mg | ORAL_TABLET | Freq: Four times a day (QID) | ORAL | Status: DC | PRN
  Administered 2023-09-19 – 2023-09-22 (×6): 650 mg via ORAL
  Filled 2023-09-18 (×6): qty 2

## 2023-09-18 MED ORDER — CEFTRIAXONE SODIUM 1 G IJ SOLR
1.0000 g | Freq: Once | INTRAMUSCULAR | Status: AC
  Administered 2023-09-18: 1 g via INTRAMUSCULAR
  Filled 2023-09-18: qty 10

## 2023-09-18 MED ORDER — LAMOTRIGINE 25 MG PO TABS
200.0000 mg | ORAL_TABLET | Freq: Two times a day (BID) | ORAL | Status: DC
  Administered 2023-09-19 – 2023-09-22 (×8): 200 mg via ORAL
  Filled 2023-09-18 (×3): qty 8
  Filled 2023-09-18 (×4): qty 2
  Filled 2023-09-18: qty 8

## 2023-09-18 MED ORDER — QUETIAPINE FUMARATE 300 MG PO TABS
300.0000 mg | ORAL_TABLET | Freq: Every day | ORAL | Status: DC
  Administered 2023-09-19 – 2023-09-21 (×4): 300 mg via ORAL
  Filled 2023-09-18 (×4): qty 1

## 2023-09-18 NOTE — ED Provider Notes (Addendum)
 Physical Exam  BP (!) 97/49   Pulse 97   Temp 98.4 F (36.9 C) (Oral)   Resp 12   Ht 4' 11 (1.499 m)   Wt 79 kg   SpO2 97%   BMI 35.18 kg/m   Physical Exam  Procedures  .Critical Care  Performed by: Malvina Alm DASEN, MD Authorized by: Malvina Alm DASEN, MD   Critical care provider statement:    Critical care time (minutes):  30   Critical care was necessary to treat or prevent imminent or life-threatening deterioration of the following conditions:  Sepsis   Critical care was time spent personally by me on the following activities:  Development of treatment plan with patient or surrogate, discussions with consultants, evaluation of patient's response to treatment, examination of patient, ordering and review of laboratory studies, ordering and review of radiographic studies, ordering and performing treatments and interventions, pulse oximetry, re-evaluation of patient's condition and review of old charts   I assumed direction of critical care for this patient from another provider in my specialty: no     Care discussed with: admitting provider     ED Course / MDM   Clinical Course as of 09/18/23 2221  Tue Sep 18, 2023  1836 Delay in getting fluids going due to patient having a difficult time keeping her arm straight.  Initial 1 L fluids just going in now.  Will also give a dose of ceftriaxone  and reassess blood pressure following this. [DW]  2131 Patient having multiple episodes of diarrhea over the past several hours.  Unable to tolerate p.o.  Continued hypotension.  Given this along with her UTI, will admit to hospitalist for further hydration and treatment along with evaluation of her diarrhea [DW]    Clinical Course User Index [DW] Malvina Alm DASEN, MD   Medical Decision Making Amount and/or Complexity of Data Reviewed Radiology: ordered.  Risk OTC drugs. Prescription drug management. Decision regarding hospitalization.   Received patient in signout.  58 year old female  presenting today for nausea and vomiting.  Generalized abdominal pain present as well.  Feels like this is similar to when she had COVID in the past.  Also having some lightheaded symptoms.  Patient's COVID, flu, RSV was all negative.  Was slightly hypotensive on arrival with nausea and fluids were started along with Zofran .  CBC and CMP largely unremarkable.  Patient signed out pending CT scans and improvement in blood pressure.  CT scan of the abdomen/pelvis as well as CT head showed no acute pathology.  UA did end up being positive for many bacteria.  Suspecting UTI as the source of all her symptoms today.  There was significant delay in getting the fluids and and on my first reassessment patient had not actually gotten any fluids with blood pressure sitting at 91/46.  Nausea had resolved at this time following Zofran .  Continued ongoing delay due to patient compliance with her IV in terms of getting fluids in.  Decided to give dose of ceftriaxone  and new IV was placed as well.  Patient continued to have ongoing diarrhea as well as hypotension in the setting of fluids.  Laboratory panel sent off for C. difficile as well as GI pathogen.  Given ongoing hypotension and intractable diarrhea, will admit patient to hospitalist for further care.  At time of admission, patient became febrile and now tachycardic meeting sepsis protocol.  Sepsis order set put in by hospitalist.     Malvina Alm DASEN, MD 09/18/23 2221  Malvina Alm DASEN, MD 09/18/23 [phone]

## 2023-09-18 NOTE — ED Provider Triage Note (Signed)
 Emergency Medicine Provider Triage Evaluation Note  [name] , a 58 y.o. female  was evaluated in triage.  Pt complains of dizziness, nausea, vomiting, diarrhea.  Review of Systems  Positive:  Negative:   Physical Exam  BP (!) 86/67 (BP Location: Right Arm) Comment: BP taken twice  Pulse 98   Temp 98.7 F (37.1 C) (Oral)   Resp 17   SpO2 99%  Gen:   Awake, no distress   Resp:  Normal effort  MSK:   Moves extremities without difficulty  Other:    Medical Decision Making  Medically screening exam initiated at 11:49 AM.  Appropriate orders placed.  [name] was informed that the remainder of the evaluation will be completed by another provider, this initial triage assessment does not replace that evaluation, and the importance of remaining in the ED until their evaluation is complete.     Gasper Devere ORN, PA-C 09/18/23 1150

## 2023-09-18 NOTE — ED Provider Notes (Signed)
 [facility] Provider Note    Event Date/Time   First MD Initiated Contact with Patient 09/18/23 1417     (approximate)   History   Emesis and Dizziness   HPI  [name] is a 58 year old female with history of CHF, CKD, COPD, HTN, OSA presenting to the emergency department for evaluation of vomiting.  Last night, patient had onset of multiple episodes of nonbloody vomiting as well as generalized abdominal pain.  No noted fevers.  Denies diarrhea.  Feels similar to when she previously had COVID.  Reports multiple sick contacts.  Additionally reports now feeling lightheaded and generally weak.  States that about an hour prior to my initial evaluation she additionally developed a generalized headache that is atypical for her.      Physical Exam   Triage Vital Signs: ED Triage Vitals  Encounter Vitals Group     BP 09/18/23 1144 (!) 86/67     Systolic BP Percentile --      Diastolic BP Percentile --      Pulse Rate 09/18/23 1144 98     Resp 09/18/23 1144 17     Temp 09/18/23 1144 98.7 F (37.1 C)     Temp Source 09/18/23 1144 Oral     SpO2 09/18/23 1144 99 %     Weight 09/18/23 1149 174 lb 2.6 oz (79 kg)     Height 09/18/23 1149 4' 11 (1.499 m)     Head Circumference --      Peak Flow --      Pain Score 09/18/23 1148 6     Pain Loc --      Pain Education --      Exclude from Growth Chart --     Most recent vital signs: Vitals:   09/18/23 1144 09/18/23 1411  BP: (!) 86/67   Pulse: 98   Resp: 17   Temp: 98.7 F (37.1 C) 98.4 F (36.9 C)  SpO2: 99%      General: Awake, interactive  CV:  Regular rate, good peripheral perfusion.  Resp:  Unlabored respirations, lungs clear to auscultation Abd:  Nondistended, soft, tender to palpation diffusely without rebound or guarding Neuro:  Symmetric facial movement, fluid speech   ED Results / Procedures / Treatments   Labs (all labs ordered are listed, but only abnormal results are  displayed) Labs Reviewed  COMPREHENSIVE METABOLIC PANEL - Abnormal; Notable for the following components:      Result Value   Glucose, Bld 144 (*)    BUN 52 (*)    Creatinine, Ser 1.28 (*)    Calcium  7.9 (*)    Total Protein 5.7 (*)    Albumin 3.1 (*)    AST 61 (*)    Alkaline Phosphatase 37 (*)    GFR, Estimated 48 (*)    All other components within normal limits  RESP PANEL BY RT-PCR (RSV, FLU A&B, COVID)  RVPGX2  CBC  LIPASE, BLOOD  URINALYSIS, ROUTINE W REFLEX MICROSCOPIC     EKG EKG independently reviewed interpreted by myself (ER attending) demonstrates:    RADIOLOGY Imaging independently reviewed and interpreted by myself demonstrates:  CT head pending CT abdomen pelvis pending  PROCEDURES:  Critical Care performed: No  Procedures   MEDICATIONS ORDERED IN ED: Medications  sodium chloride  0.9 % bolus 1,000 mL (has no administration in time range)  ondansetron  (ZOFRAN ) injection 4 mg (has no administration in time range)  iohexol  (OMNIPAQUE ) 300 MG/ML solution 100 mL (  has no administration in time range)  acetaminophen  (TYLENOL ) tablet 650 mg (has no administration in time range)  ondansetron  (ZOFRAN -ODT) disintegrating tablet 4 mg (4 mg Oral Given 09/18/23 1153)     IMPRESSION / MDM / ASSESSMENT AND PLAN / ED COURSE  I reviewed the triage vital signs and the nursing notes.  Differential diagnosis includes, but is not limited to, biliary pathology, appendicitis, other acute intra-abdominal process, viral illness, dehydration  Patient's presentation is most consistent with acute presentation with potential threat to life or bodily function.  58 year old female presenting to the emergency department for evaluation of vomiting and lightheadedness.  Soft blood pressure on presentation, but awake and mentating well.  Labs in triage with stable creatinine elevation.  Normal white blood cell count.  Viral panel negative.  Will obtain CT head and CT abdomen pelvis to  further evaluate.  Will also treat symptomatically with Tylenol , Zofran , IV fluids.  If imaging is reassuring and patient feels improved, may be stable for discharge.  Signed out to oncoming physician at 1515 pending completion of workup and disposition.      FINAL CLINICAL IMPRESSION(S) / ED DIAGNOSES   Final diagnoses:  Nausea and vomiting, unspecified vomiting type  Generalized abdominal pain     Rx / DC Orders   ED Discharge Orders     None        Note:  This document was prepared using Dragon voice recognition software and may include unintentional dictation errors.   Levander Slate, MD 09/18/23 [phone]

## 2023-09-18 NOTE — H&P (Signed)
 History and Physical    Patient: [name] FMW:[id number] DOB: 1966-05-02 DOA: 09/18/2023 DOS: the patient was seen and examined on 09/18/2023 PCP: Glenard Mire, MD  Patient coming from: Home  Chief Complaint:  Chief Complaint  Patient presents with   Emesis   Dizziness   HPI: [name] is a 58 y.o. female with medical history significant of Insulin -dependent type 2 diabetes, HFpEF, OSA, nonobstructive CAD, TIA, hypothyroidism, hypertension, hyperlipidemia, schizoaffective disorder, who presents to the ED due to nausea.  [name] states that in the last 24 hours, she has developed multiple episodes of nausea and vomiting.  Then today, she has began to experience diarrhea.  She states her emesis and diarrhea have been nonbloody, nonmelanotic.  She has not noted any fevers at home.  She denies any cough, rhinorrhea, shortness of breath, chest pain or palpitations.  ED course: On arrival to the ED, patient was hypotensive 86/67 with heart rate of 98.  She was saturating at 99% on room air.  She was afebrile at 98.7.  Initial workup notable for normal CBC, BUN 52, creatinine 1.28, and GFR 48.  Urinalysis with glucosuria, increased Pacific gravity many bacteria but no leukocytes, nitrites or WBC.  CT of the abdomen and CT head were obtained with no acute abnormalities.  Patient started on IV fluids, Zofran , Rocephin  and Tylenol .  [facility] contacted for admission.   Review of Systems: As mentioned in the history of present illness. All other systems reviewed and are negative.  Past Medical History:  Diagnosis Date   Adenomatous colon polyp    Anxiety    Arthritis    Asthma    Atherosclerosis of aorta (HCC)    Bacteremia due to Escherichia coli 09/10/2022   Bipolar depression (HCC)    Cerebral microvascular disease    CHF (congestive heart failure) (HCC)    a.) TTE 10/05/2021: EF 60-65%, no RWMAs, norm RVSF, G1DD; b.) TTE with bubble study 02/12/2022: EF 55-60%, mild LVH, no  RWMAs, no IAS, G1DD   Chronic bronchitis (HCC)    CKD (chronic kidney disease), stage III (HCC)    Constipation    a.) on linaclotide    COPD (chronic obstructive pulmonary disease) (HCC)    Coronary artery disease    a.) LHC 04/13/2011: normal coronaries; no CAD; b.) MV 10/29/2020: mod p-mLAD disease   Depression    Diabetic neuropathy (HCC)    Dyspnea    Encephalopathy acute    Gastric ulcer    GERD (gastroesophageal reflux disease)    Headache    Heart attack (HCC) 2009   Hepatomegaly    History of 2019 novel coronavirus disease (COVID-19) 04/28/2020   PCR (+): 04/28/2020, 02/14/2021   Hyperlipidemia    Hypertension    Hypomagnesemia    Hypothyroid    IC (interstitial cystitis)    IDA (iron deficiency anemia)    IgG deficiency (HCC)    Liver lesion    Favored to be benign on CT; MRI of abd w/contract in Aug 2016. Liver Biopsy-Negative, March 2016   Long term current use of aspirin     Long term current use of clopidogrel     Migraines    Morbid obesity (HCC)    NASH (nonalcoholic steatohepatitis)    OSA (obstructive sleep apnea)    a.) no nocturnal PAP therapy; issues with fit of DME; using 2L/[place] supplemental oxygen    Palpitation    Peripheral vascular disease (HCC)    Pneumonia    Recurrent falls  RLS (restless legs syndrome)    Schizoaffective disorder, bipolar type (HCC)    Seasonal allergies    Tachycardia    TIA (transient ischemic attack) 2023   a.) on DAPT therapy per neurology recommendations; started 10/2021   Type 2 diabetes, uncontrolled, with neuropathy 10/15/2015   Overview:  Microalbumin 7.7 12/2010: Hgb A1c 10.2% 12/2010 and 9.7% 03/2011, eye exam 10/2009 [facility]    Urinary incontinence    Vertigo    Vitamin B12 deficiency    Vitamin D  deficiency    Past Surgical History:  Procedure Laterality Date   ABDOMINAL HYSTERECTOMY     Partial   bladder stim  2007   BLADDER SURGERY     x 2. tacking of bladder and the other was the stimulator  placement   CHOLECYSTECTOMY     COLONOSCOPY WITH PROPOFOL  N/A 03/04/2019   Procedure: COLONOSCOPY WITH PROPOFOL ;  Surgeon: Jinny Carmine, MD;  Location: [facility] ENDOSCOPY;  Service: Endoscopy;  Laterality: N/A;   ESOPHAGOGASTRODUODENOSCOPY (EGD) WITH PROPOFOL  N/A 03/04/2019   Procedure: ESOPHAGOGASTRODUODENOSCOPY (EGD) WITH PROPOFOL ;  Surgeon: Jinny Carmine, MD;  Location: [facility] ENDOSCOPY;  Service: Endoscopy;  Laterality: N/A;   INCISION AND DRAINAGE ABSCESS Right 04/16/2019   Procedure: INCISION AND DRAINAGE ABSCESS;  Surgeon: Tye Millet, DO;  Location: [facility] ORS;  Service: General;  Laterality: Right;   INTERSTIM IMPLANT PLACEMENT N/A 07/16/2023   Procedure: RENNA IMPLANT FIRST STAGE WITH IMPEDANCE CHECK;  Surgeon: Gaston Hamilton, MD;  Location: [facility] ORS;  Service: Urology;  Laterality: N/A;   INTERSTIM IMPLANT PLACEMENT N/A 07/16/2023   Procedure: RENNA IMPLANT SECOND STAGE;  Surgeon: Gaston Hamilton, MD;  Location: [facility] ORS;  Service: Urology;  Laterality: N/A;   INTERSTIM IMPLANT REMOVAL N/A 07/16/2023   Procedure: REMOVAL OF INTERSTIM IMPLANT;  Surgeon: Gaston Hamilton, MD;  Location: [facility] ORS;  Service: Urology;  Laterality: N/A;   INTERSTIM-GENERATOR CHANGE N/A 12/31/2017   Procedure: INTERSTIM-GENERATOR CHANGE;  Surgeon: Gaston Hamilton, MD;  Location: [facility] ORS;  Service: Urology;  Laterality: N/A;   LEFT HEART CATH AND CORONARY ANGIOGRAPHY Left 04/13/2011   Procedure: LEFT HEART CATH AND CORONARY ANGIOGRAPHY; Location: [facility]; Surgeon: Wolm Rhyme, MD   LIVER BIOPSY  11/2014   Negative   LUNG BIOPSY  2015   small spot on lung   TUBAL LIGATION     UPPER GI ENDOSCOPY     WRIST SURGERY Right 11/02/2019   Social History:  reports that she has never smoked. She has never used smokeless tobacco. She reports that she does not drink alcohol and does not use drugs.  Allergies  Allergen Reactions   Mirtazapine Anaphylaxis    closes throat   Morphine And Codeine  Anaphylaxis   Sulfamethoxazole-Trimethoprim Rash    Whelps all over   Amoxicillin  Er Nausea And Vomiting   Codeine Itching and Rash   Hydrocodone -Acetaminophen  Rash   Keflex  [Cephalexin ] Rash    Pt does not remember having a reaction or rash, no airway involvement   Prasterone  Nausea And Vomiting    Family History  Problem Relation Age of Onset   Diabetes Mother    Heart disease Mother    Hyperlipidemia Mother    Hypertension Mother    Kidney disease Mother    Mental illness Mother    Hypothyroidism Mother    Stroke Mother    Osteoporosis Mother    Glaucoma Mother    Congestive Heart Failure Mother    Hypertension Father    Kidney disease Brother    Intracerebral hemorrhage  Brother    Breast cancer Maternal Grandmother 60   Heart disease Maternal Grandfather    Rheum arthritis Maternal Grandfather    Cancer Maternal Grandfather        liver   Kidney disease Maternal Grandfather    Cancer Paternal Grandmother        lung   Asthma Daughter    Cancer Daughter        throat   Bipolar disorder Daughter    Bipolar disorder Daughter    Hypertension Daughter    Restless legs syndrome Daughter     Prior to Admission medications   Medication Sig Start Date End Date Taking? Authorizing Provider  atorvastatin  (LIPITOR ) 80 MG tablet Take 1 tablet (80 mg total) by mouth at bedtime. 05/09/23   Sowles, Krichna, MD  busPIRone  (BUSPAR ) 30 MG tablet Take 30 mg by mouth 2 (two) times daily. 11/09/20   Deane Rollo RAMAN, NP  cetirizine  (ZYRTEC ) 10 MG tablet Take 1 tablet (10 mg total) by mouth daily. Patient taking differently: Take 10 mg by mouth at bedtime. 03/27/23   Cuthriell, Dorn BIRCH, PA-C  citalopram  (CELEXA ) 20 MG tablet Take 20 mg by mouth every morning. 01/16/20   [facility]  empagliflozin  (JARDIANCE ) 25 MG TABS tablet Take 25 mg by mouth daily.    [provider]  ezetimibe  (ZETIA ) 10 MG tablet Take 1 tablet (10 mg total) by mouth at bedtime. 05/09/23    Sowles, Krichna, MD  fenofibrate  micronized (LOFIBRA) 134 MG capsule Take 1 capsule (134 mg total) by mouth at bedtime. 05/09/23   Sowles, Krichna, MD  fluticasone  (FLONASE ) 50 MCG/ACT nasal spray Place 2 sprays into both nostrils daily. Patient taking differently: Place 2 sprays into both nostrils daily as needed for allergies. 05/09/23   Sowles, Krichna, MD  gabapentin  (NEURONTIN ) 600 MG tablet Take 600 mg by mouth at bedtime.    [provider]  HUMALOG  KWIKPEN 100 UNIT/ML KwikPen Inject into the skin as needed (high blood sguar). USE ONLY FOR GLUCOSE OVER 300 04/12/22   [provider]  HUMULIN  R U-500 KWIKPEN 500 UNIT/ML kwikpen Inject 50 Units into the skin 3 (three) times daily with meals. 01/10/21   [provider]  hydrOXYzine  (ATARAX ) 50 MG tablet Take 50 mg by mouth 3 (three) times daily as needed for anxiety. 07/24/22   [provider]  lamoTRIgine  (LAMICTAL ) 200 MG tablet Take 200 mg by mouth 2 (two) times daily.     Deane Rollo RAMAN, NP  meclizine  (ANTIVERT ) 25 MG tablet Take 25 mg by mouth 3 (three) times daily as needed for dizziness.    [provider]  metoprolol  tartrate (LOPRESSOR ) 25 MG tablet Take 1 tablet (25 mg total) by mouth 2 (two) times daily. 05/11/23   Gerard Frederick, NP  Multiple Vitamin (MULTI-VITAMIN) tablet Take 1 tablet by mouth daily.    [provider]  nitroGLYCERIN  (NITROSTAT ) 0.4 MG SL tablet Place 1 tablet (0.4 mg total) under the tongue every 5 (five) minutes as needed for chest pain. 05/11/23   Gerard Frederick, NP  ondansetron  (ZOFRAN -ODT) 4 MG disintegrating tablet Take 1 tablet (4 mg total) by mouth every 8 (eight) hours as needed. 03/19/23   Tapia, Leisa, PA-C  OneTouch Delica Lancets 30G MISC by Does not apply route.    [provider]  oxyCODONE -acetaminophen  (PERCOCET) 5-325 MG tablet Take 1-2 tablets by mouth every 8 (eight) hours as needed for severe pain (pain score 7-10) or moderate pain (pain score  4-6). 07/16/23 07/15/24  Gaston Hamilton, MD  QUEtiapine  (SEROQUEL ) 300 MG tablet Take 300 mg by mouth at bedtime. 11/23/20   [facility]  SYNTHROID  88 MCG tablet Take 1 tablet (88 mcg total) by mouth daily before breakfast. 06/18/23   Glenard Mire, MD   Physical Exam: Vitals:   09/18/23 1637 09/18/23 1858 09/18/23 2003 09/18/23 2224  BP: (!) 91/46 (!) 80/55 (!) 97/49 (!) 85/51  Pulse: 97   (!) 101  Resp: 12   15  Temp:    (!) 101.4 F (38.6 C)  TempSrc:    Oral  SpO2: 97%   100%  Weight:      Height:       Physical Exam Vitals and nursing note reviewed.  Constitutional:      Appearance: She is obese.  HENT:     Head: Normocephalic and atraumatic.     Mouth/Throat:     Mouth: Mucous membranes are dry.     Pharynx: Oropharynx is clear.  Eyes:     Extraocular Movements: Extraocular movements intact.     Pupils: Pupils are equal, round, and reactive to light.  Cardiovascular:     Rate and Rhythm: Regular rhythm. Tachycardia present.     Heart sounds: No murmur heard.    No gallop.  Pulmonary:     Effort: Pulmonary effort is normal. No respiratory distress.     Breath sounds: Normal breath sounds. No wheezing or rales.  Abdominal:     General: Bowel sounds are normal. There is no distension.     Palpations: Abdomen is soft.     Tenderness: There is abdominal tenderness (Right side predominant). There is no guarding.  Musculoskeletal:     Right lower leg: No edema.     Left lower leg: No edema.  Skin:    General: Skin is warm and dry.  Neurological:     General: No focal deficit present.     Mental Status: She is alert and oriented to person, place, and time. Mental status is at baseline.  Psychiatric:        Mood and Affect: Mood normal.        Behavior: Behavior normal.    Data Reviewed: CBC with WBC of 5.6, hemoglobin 13.9, platelets of 243 CMP with sodium of 139, potassium 3.5, bicarb 24, glucose 144, BUN 52, creatinine 0.28, albumin 3.1, AST  61, GFR 48 COVID #19, influenza and RSV PCR negative Urinalysis with glucosuria, increased Pacific gravity many bacteria but no leukocytes, nitrites or WBC.  EKG personally reviewed.  Sinus rhythm with rate of 111.  No ST or T wave changes concerning for acute ischemia.  CT ABDOMEN PELVIS W CONTRAST Result Date: 09/18/2023 CLINICAL DATA:  Abdominal pain. EXAM: CT ABDOMEN AND PELVIS WITH CONTRAST TECHNIQUE: Multidetector CT imaging of the abdomen and pelvis was performed using the standard protocol following bolus administration of intravenous contrast. RADIATION DOSE REDUCTION: This exam was performed according to the departmental dose-optimization program which includes automated exposure control, adjustment of the mA and/or kV according to patient size and/or use of iterative reconstruction technique. CONTRAST:  OMNIPAQUE  IOHEXOL  300 MG/ML  SOLN COMPARISON:  CT abdomen pelvis dated 02/05/2023. FINDINGS: Lower chest: The visualized lung bases are clear. No intra-abdominal free air or free fluid. Hepatobiliary: The liver is unremarkable. No biliary dilatation. Cholecystectomy. No retained calcified stone noted in the central CBD. Pancreas: Unremarkable. No pancreatic ductal dilatation or surrounding inflammatory changes. Spleen: Normal in size without focal abnormality. Adrenals/Urinary  Tract: The adrenal glands are unremarkable. Mild fullness of the right renal collecting system similar to prior CT. A transition noted at the ureteropelvic junction. No obstructing stone. Underlying stricture is not excluded. The left kidney is unremarkable. The visualized ureters and urinary bladder appear unremarkable. Stomach/Bowel: There is loose stool in the proximal colon. Formed stool noted in the distal colon. There is no bowel obstruction or active inflammation. No CT evidence of acute appendicitis. Vascular/Lymphatic: Mild aortoiliac atherosclerotic disease. The IVC is unremarkable. No portal venous gas. There  is no adenopathy. Reproductive: Hysterectomy.  No suspicious adnexal masses. Other: None Musculoskeletal: Mild degenerative changes. Sacral stimulator with battery pack in the subcutaneous soft tissues of the right buttock. No acute osseous pathology. IMPRESSION: 1. No acute intra-abdominal or pelvic pathology. 2.  Aortic Atherosclerosis (ICD10-I70.0). Electronically Signed   By: Vanetta Chou M.D.   On: 09/18/2023 16:29   CT Head Wo Contrast Result Date: 09/18/2023 CLINICAL DATA:  Worsening headache, dizziness EXAM: CT HEAD WITHOUT CONTRAST TECHNIQUE: Contiguous axial images were obtained from the base of the skull through the vertex without intravenous contrast. RADIATION DOSE REDUCTION: This exam was performed according to the departmental dose-optimization program which includes automated exposure control, adjustment of the mA and/or kV according to patient size and/or use of iterative reconstruction technique. COMPARISON:  04/17/2023 FINDINGS: Brain: No evidence of acute infarction, hemorrhage, mass, mass effect, or midline shift. No hydrocephalus or extra-axial fluid collection. Basal ganglia calcifications. Vascular: No hyperdense vessel. Skull: Negative for fracture or focal lesion. Sinuses/Orbits: No acute finding. Other: The mastoid air cells are well aerated. IMPRESSION: No acute intracranial process. Electronically Signed   By: Donald Campion M.D.   On: 09/18/2023 15:51   Results are pending, will review when available.  Assessment and Plan:  * Sepsis (HCC) Patient is presenting with approximately 24 hours of multiple episodes of non-bloody vomiting and diarrhea, likely viral in nature. CT A/P reassuring with no abnormalities noted. Meets sepsis criteria given hypotension, tachycardia and fever of 101.4, although no evidence of end organ damage. Hypotension has been fluid responsive.   - Telemetry monitoring - Rule out bacterial etiology with GI panel and C. difficile screening - Blood  cultures - Once above tests are negative, start Imodium  - S/p one-time dose of Rocephin  - Give one-time dose of Levaquin  given severity of acute diarrhea - Procalcitonin pending - Lactic acid pending - S/p 1 L bolus - Continue maintenance IV fluids - One-time dose of IV Tylenol , by oral once able to tolerate p.o. intake - Zofran  as needed  Pyuria Bacteria seen on urinalysis, however no urinary symptoms noted and urinalysis is otherwise negative for any WBC, leukocytes or nitrites. Overall, highly doubt UTI is causing her vomiting/diarrhea/sepsis.   - Hold off on further antibiotics  CKD stage 3 due to type 2 diabetes mellitus (HCC) History of CKD stage IIIa in the setting of diabetes and hypertension, with baseline creatinine between 1 and 3, currently within baseline range.  Obstructive sleep apnea Unable to tolerate CPAP per chart review.  - Continue home nocturnal supplemental oxygen   Type II diabetes mellitus with renal manifestations (HCC) - Hold home regimen (Humulin  50 units TID per endocrinology notes) - SSI, resistant - Semglee  20 units at bedtime  Chronic diastolic CHF (congestive heart failure) (HCC) Does not appear hypervolemic on examination.  - Daily weights  COPD (chronic obstructive pulmonary disease) (HCC) - Continue home bronchodilators  Hypothyroid - Continue home Synthroid   Advance Care Planning:   Code Status:  Full Code   Consults: None  Family Communication: No family at bedside.   Severity of Illness: The appropriate patient status for this patient is OBSERVATION. Observation status is judged to be reasonable and necessary in order to provide the required intensity of service to ensure the patient's safety. [name] presenting symptoms, physical exam findings, and initial radiographic and laboratory data in the context of their medical condition is felt to place them at decreased risk for further clinical deterioration. Furthermore, it is  anticipated that the patient will be medically stable for discharge from the hospital within 2 midnights of admission.   Author: Clayborne Broom, MD 09/18/2023 10:50 PM  For on call review [email or web address].

## 2023-09-18 NOTE — Assessment & Plan Note (Signed)
 Does not appear hypervolemic on examination.  - Daily weights

## 2023-09-18 NOTE — ED Triage Notes (Signed)
 Pt to ED [facility] from home for dizziness, emesis started last night. Wears 2 L [place] at night. Hx vertigo  Hx surgery in November of bladder stim surgery. +abd pain Reports felt similar when had covid last time

## 2023-09-18 NOTE — Assessment & Plan Note (Signed)
-  Continue home bronchodilators 

## 2023-09-18 NOTE — Assessment & Plan Note (Signed)
 History of CKD stage IIIa in the setting of diabetes and hypertension, with baseline creatinine between 1 and 3, currently within baseline range.

## 2023-09-18 NOTE — Assessment & Plan Note (Signed)
 Notable hypotension in the setting of hypovolemia.  Blood pressure has improved with IV fluid resuscitation.  - Hold home antihypertensives - S/p 2 L bolus - Continue maintenance IV fluid resuscitation

## 2023-09-18 NOTE — Assessment & Plan Note (Addendum)
 Unable to tolerate CPAP per chart review.  - Continue home nocturnal supplemental oxygen

## 2023-09-18 NOTE — Assessment & Plan Note (Deleted)
 Patient is presenting with approximately 24 hours of multiple episodes of nausea, vomiting and now diarrhea since arrival to the ED.  Likely viral in nature.  - Rule out bacterial etiology with GI panel and C. difficile screening - Once above tests are negative, start Imodium  - Zofran  as needed

## 2023-09-18 NOTE — Assessment & Plan Note (Addendum)
 Bacteria seen on urinalysis, however no urinary symptoms noted and urinalysis is otherwise negative for any WBC, leukocytes or nitrites. Overall, highly doubt UTI is causing her vomiting/diarrhea/sepsis.   - Hold off on further antibiotics

## 2023-09-18 NOTE — Assessment & Plan Note (Addendum)
-   Hold home regimen (Humulin 50 units TID per endocrinology notes) - SSI, resistant - Semglee 20 units at bedtime

## 2023-09-18 NOTE — Assessment & Plan Note (Addendum)
 Patient is presenting with approximately 24 hours of multiple episodes of non-bloody vomiting and diarrhea, likely viral in nature. CT A/P reassuring with no abnormalities noted. Meets sepsis criteria given hypotension, tachycardia and fever of 101.4, although no evidence of end organ damage. Hypotension has been fluid responsive.   - Telemetry monitoring - Rule out bacterial etiology with GI panel and C. difficile screening - Blood cultures - Once above tests are negative, start Imodium  - S/p one-time dose of Rocephin  - Give one-time dose of Levaquin  given severity of acute diarrhea - Procalcitonin pending - Lactic acid pending - S/p 1 L bolus - Continue maintenance IV fluids - One-time dose of IV Tylenol , by oral once able to tolerate p.o. intake - Zofran  as needed

## 2023-09-18 NOTE — Assessment & Plan Note (Signed)
 Continue home Synthroid

## 2023-09-19 DIAGNOSIS — N179 Acute kidney failure, unspecified: Secondary | ICD-10-CM | POA: Diagnosis present

## 2023-09-19 DIAGNOSIS — I5032 Chronic diastolic (congestive) heart failure: Secondary | ICD-10-CM | POA: Diagnosis present

## 2023-09-19 DIAGNOSIS — F250 Schizoaffective disorder, bipolar type: Secondary | ICD-10-CM | POA: Diagnosis present

## 2023-09-19 DIAGNOSIS — A4151 Sepsis due to Escherichia coli [E. coli]: Secondary | ICD-10-CM | POA: Diagnosis present

## 2023-09-19 DIAGNOSIS — E1151 Type 2 diabetes mellitus with diabetic peripheral angiopathy without gangrene: Secondary | ICD-10-CM | POA: Diagnosis present

## 2023-09-19 DIAGNOSIS — E872 Acidosis, unspecified: Secondary | ICD-10-CM | POA: Diagnosis present

## 2023-09-19 DIAGNOSIS — Z794 Long term (current) use of insulin: Secondary | ICD-10-CM | POA: Diagnosis not present

## 2023-09-19 DIAGNOSIS — K7581 Nonalcoholic steatohepatitis (NASH): Secondary | ICD-10-CM | POA: Diagnosis present

## 2023-09-19 DIAGNOSIS — E039 Hypothyroidism, unspecified: Secondary | ICD-10-CM | POA: Diagnosis present

## 2023-09-19 DIAGNOSIS — E669 Obesity, unspecified: Secondary | ICD-10-CM | POA: Diagnosis present

## 2023-09-19 DIAGNOSIS — E876 Hypokalemia: Secondary | ICD-10-CM | POA: Diagnosis present

## 2023-09-19 DIAGNOSIS — E860 Dehydration: Secondary | ICD-10-CM | POA: Diagnosis present

## 2023-09-19 DIAGNOSIS — I130 Hypertensive heart and chronic kidney disease with heart failure and stage 1 through stage 4 chronic kidney disease, or unspecified chronic kidney disease: Secondary | ICD-10-CM | POA: Diagnosis present

## 2023-09-19 DIAGNOSIS — Z8616 Personal history of COVID-19: Secondary | ICD-10-CM | POA: Diagnosis not present

## 2023-09-19 DIAGNOSIS — E1140 Type 2 diabetes mellitus with diabetic neuropathy, unspecified: Secondary | ICD-10-CM | POA: Diagnosis present

## 2023-09-19 DIAGNOSIS — E785 Hyperlipidemia, unspecified: Secondary | ICD-10-CM | POA: Diagnosis present

## 2023-09-19 DIAGNOSIS — J4489 Other specified chronic obstructive pulmonary disease: Secondary | ICD-10-CM | POA: Diagnosis present

## 2023-09-19 DIAGNOSIS — G2581 Restless legs syndrome: Secondary | ICD-10-CM | POA: Diagnosis present

## 2023-09-19 DIAGNOSIS — E1122 Type 2 diabetes mellitus with diabetic chronic kidney disease: Secondary | ICD-10-CM | POA: Diagnosis present

## 2023-09-19 DIAGNOSIS — A044 Other intestinal Escherichia coli infections: Secondary | ICD-10-CM | POA: Diagnosis present

## 2023-09-19 DIAGNOSIS — R6520 Severe sepsis without septic shock: Secondary | ICD-10-CM | POA: Diagnosis present

## 2023-09-19 DIAGNOSIS — N1831 Chronic kidney disease, stage 3a: Secondary | ICD-10-CM | POA: Diagnosis present

## 2023-09-19 DIAGNOSIS — R112 Nausea with vomiting, unspecified: Secondary | ICD-10-CM | POA: Diagnosis present

## 2023-09-19 DIAGNOSIS — A419 Sepsis, unspecified organism: Secondary | ICD-10-CM | POA: Diagnosis not present

## 2023-09-19 LAB — CBC WITH DIFFERENTIAL/PLATELET
Abs Immature Granulocytes: 0.04 10*3/uL (ref 0.00–0.07)
Basophils Absolute: 0 10*3/uL (ref 0.0–0.1)
Basophils Relative: 1 %
Eosinophils Absolute: 0 10*3/uL (ref 0.0–0.5)
Eosinophils Relative: 0 %
HCT: 36.6 % (ref 36.0–46.0)
Hemoglobin: 12.1 g/dL (ref 12.0–15.0)
Immature Granulocytes: 1 %
Lymphocytes Relative: 11 %
Lymphs Abs: 0.5 10*3/uL — ABNORMAL LOW (ref 0.7–4.0)
MCH: 29.7 pg (ref 26.0–34.0)
MCHC: 33.1 g/dL (ref 30.0–36.0)
MCV: 89.7 fL (ref 80.0–100.0)
Monocytes Absolute: 0.3 10*3/uL (ref 0.1–1.0)
Monocytes Relative: 6 %
Neutro Abs: 3.9 10*3/uL (ref 1.7–7.7)
Neutrophils Relative %: 81 %
Platelets: 196 10*3/uL (ref 150–400)
RBC: 4.08 MIL/uL (ref 3.87–5.11)
RDW: 13.6 % (ref 11.5–15.5)
WBC: 4.8 10*3/uL (ref 4.0–10.5)
nRBC: 0 % (ref 0.0–0.2)

## 2023-09-19 LAB — GASTROINTESTINAL PANEL BY PCR, STOOL (REPLACES STOOL CULTURE)
Adenovirus F40/41: NOT DETECTED
Astrovirus: NOT DETECTED
Campylobacter species: NOT DETECTED
Cryptosporidium: NOT DETECTED
Cyclospora cayetanensis: NOT DETECTED
Entamoeba histolytica: NOT DETECTED
Enteroaggregative E coli (EAEC): NOT DETECTED
Enteropathogenic E coli (EPEC): DETECTED — AB
Enterotoxigenic E coli (ETEC): NOT DETECTED
Giardia lamblia: NOT DETECTED
Norovirus GI/GII: DETECTED — AB
Plesimonas shigelloides: NOT DETECTED
Rotavirus A: NOT DETECTED
Salmonella species: NOT DETECTED
Sapovirus (I, II, IV, and V): NOT DETECTED
Shiga like toxin producing E coli (STEC): NOT DETECTED
Shigella/Enteroinvasive E coli (EIEC): NOT DETECTED
Vibrio cholerae: NOT DETECTED
Vibrio species: NOT DETECTED
Yersinia enterocolitica: NOT DETECTED

## 2023-09-19 LAB — LACTIC ACID, PLASMA: Lactic Acid, Venous: 0.8 mmol/L (ref 0.5–1.9)

## 2023-09-19 LAB — PROCALCITONIN: Procalcitonin: 2.28 ng/mL

## 2023-09-19 LAB — HIV ANTIBODY (ROUTINE TESTING W REFLEX): HIV Screen 4th Generation wRfx: NONREACTIVE

## 2023-09-19 LAB — BASIC METABOLIC PANEL
Anion gap: 16 — ABNORMAL HIGH (ref 5–15)
BUN: 54 mg/dL — ABNORMAL HIGH (ref 6–20)
CO2: 19 mmol/L — ABNORMAL LOW (ref 22–32)
Calcium: 6.8 mg/dL — ABNORMAL LOW (ref 8.9–10.3)
Chloride: 100 mmol/L (ref 98–111)
Creatinine, Ser: 1.51 mg/dL — ABNORMAL HIGH (ref 0.44–1.00)
GFR, Estimated: 40 mL/min — ABNORMAL LOW (ref >60)
Glucose, Bld: 230 mg/dL — ABNORMAL HIGH (ref 70–99)
Potassium: 3.3 mmol/L — ABNORMAL LOW (ref 3.5–5.1)
Sodium: 135 mmol/L (ref 135–145)

## 2023-09-19 LAB — MAGNESIUM: Magnesium: 1.6 mg/dL — ABNORMAL LOW (ref 1.7–2.4)

## 2023-09-19 LAB — CBG MONITORING, ED
Glucose-Capillary: 200 mg/dL — ABNORMAL HIGH (ref 70–99)
Glucose-Capillary: 246 mg/dL — ABNORMAL HIGH (ref 70–99)
Glucose-Capillary: 136 mg/dL — ABNORMAL HIGH (ref 70–99)
Glucose-Capillary: 130 mg/dL — ABNORMAL HIGH (ref 70–99)

## 2023-09-19 LAB — PHOSPHORUS: Phosphorus: 4.2 mg/dL (ref 2.5–4.6)

## 2023-09-19 MED ORDER — SACCHAROMYCES BOULARDII 250 MG PO CAPS
250.0000 mg | ORAL_CAPSULE | Freq: Two times a day (BID) | ORAL | Status: DC
  Administered 2023-09-19 – 2023-09-22 (×7): 250 mg via ORAL
  Filled 2023-09-19 (×7): qty 1

## 2023-09-19 MED ORDER — LEVOFLOXACIN IN D5W 250 MG/50ML IV SOLN
250.0000 mg | Freq: Once | INTRAVENOUS | Status: AC
Start: 2023-09-19 — End: 2023-09-19
  Administered 2023-09-19: 250 mg via INTRAVENOUS
  Filled 2023-09-19: qty 50

## 2023-09-19 MED ORDER — LACTATED RINGERS IV SOLN: INTRAVENOUS | Status: AC

## 2023-09-19 MED ORDER — SODIUM BICARBONATE 8.4 % IV SOLN
INTRAVENOUS | Status: AC
  Filled 2023-09-19: qty 50

## 2023-09-19 MED ORDER — SODIUM BICARBONATE 8.4 % IV SOLN
100.0000 meq | Freq: Once | INTRAVENOUS | Status: AC
  Administered 2023-09-19: 100 meq via INTRAVENOUS
  Filled 2023-09-19: qty 50

## 2023-09-19 MED ORDER — MAGNESIUM SULFATE 2 GM/50ML IV SOLN
2.0000 g | Freq: Once | INTRAVENOUS | Status: AC
  Administered 2023-09-19: 2 g via INTRAVENOUS
  Filled 2023-09-19: qty 50

## 2023-09-19 MED ORDER — PANTOPRAZOLE SODIUM 40 MG PO TBEC
40.0000 mg | DELAYED_RELEASE_TABLET | Freq: Two times a day (BID) | ORAL | Status: DC
  Administered 2023-09-19 – 2023-09-22 (×7): 40 mg via ORAL
  Filled 2023-09-19 (×7): qty 1

## 2023-09-19 MED ORDER — SODIUM CHLORIDE 0.9 % IV BOLUS
500.0000 mL | Freq: Once | INTRAVENOUS | Status: AC
  Administered 2023-09-19: 500 mL via INTRAVENOUS

## 2023-09-19 MED ORDER — LEVOFLOXACIN IN D5W 750 MG/150ML IV SOLN
750.0000 mg | INTRAVENOUS | Status: DC
  Administered 2023-09-21: 750 mg via INTRAVENOUS
  Filled 2023-09-19: qty 150

## 2023-09-19 MED ORDER — MIDODRINE HCL 5 MG PO TABS
10.0000 mg | ORAL_TABLET | Freq: Three times a day (TID) | ORAL | Status: DC
  Administered 2023-09-19 (×2): 10 mg via ORAL
  Filled 2023-09-19 (×2): qty 2

## 2023-09-19 NOTE — ED Notes (Signed)
Pt had two episodes of diarrhea.

## 2023-09-19 NOTE — ED Notes (Signed)
 Pt up to bs commode has small amount of watery yellow brown stool

## 2023-09-19 NOTE — ED Notes (Signed)
 Pt requires 2nd PIV 22g placed to pt posterior RUE flushing without difficulty  and .9 NS L able to be completed per order

## 2023-09-19 NOTE — Progress Notes (Signed)
 [facility] Progress Note  Patient: [name]    ZOX:[id number]  DOA: 09/18/2023     Date of Service: the patient was seen and examined on 09/19/2023  Chief Complaint  Patient presents with   Emesis   Dizziness   Brief hospital course:  [name] is a 58 y.o. female with medical history significant of Insulin -dependent type 2 diabetes, HFpEF, OSA, nonobstructive CAD, TIA, hypothyroidism, hypertension, hyperlipidemia, schizoaffective disorder, who presents to the ED due to nausea.   [name] states that in the last 24 hours, she has developed multiple episodes of nausea and vomiting.  Then today, she has began to experience diarrhea.  She states her emesis and diarrhea have been nonbloody, nonmelanotic.  She has not noted any fevers at home.  She denies any cough, rhinorrhea, shortness of breath, chest pain or palpitations.   ED course: On arrival to the ED, patient was hypotensive 86/67 with heart rate of 98.  She was saturating at 99% on room air.  She was afebrile at 98.7.  Initial workup notable for normal CBC, BUN 52, creatinine 1.28, and GFR 48.  Urinalysis with glucosuria, increased Pacific gravity many bacteria but no leukocytes, nitrites or WBC.  CT of the abdomen and CT head were obtained with no acute abnormalities.  Patient started on IV fluids, Zofran , Rocephin  and Tylenol .  TRH contacted for admission.   Assessment and Plan:  # Severe sepsis secondary to E. coli gastroenteritis GI pathogen positive for E. Coli CT scan negative for any acute findings Patient meets severe sepsis criteria, fever, tachycardia, hypotension, E. coli positive. S/p ceftriaxone  and Levaquin  given in the ED Started Levaquin  750 mg every 48 hour as per renal functions Continue IV fluid for hydration Started midodrine  for pressure support Monitor BP and titrate medications accordingly Started probiotics again PPI for GI prophylaxis   # Hypokalemia, potassium repleted. # Metabolic  acidosis, bicarb repleted # Hypomagnesemia, mag repleted. Monitor electrolytes and replete as needed.   # Pyuria, Less likely UTI Urine negative for LE and nitrite, bacteria many. Follow urine culture Continue above antibiotics   # AKI Baseline creatinine 1.03, EGFR >60 1/15 Cr 1.51 elevated most likely prerenal due to dehydration Continue IV fluid, monitor renal functions and urine output Daily  # Hypertension, HLD Presented with sepsis and hypotension, held antihypertensive medications for now Continue Lipitor   # IDDM T2 Held home medications for now Continue Semglee  20 units nightly Continue NovoLog  sliding scale, monitor CBG Continue diabetic diet  # Depression Continue BuSpar , Celexa  and Seroquel  Home dose Continued gabapentin  and Lamictal  home dose  # Hypothyroid, continue Synthroid    Body mass index is 35.18 kg/m.  Interventions:  Diet: soft diet DVT Prophylaxis: Subcutaneous Lovenox    Advance goals of care discussion: Full code  Family Communication: family was not present at bedside, at the time of interview.  The pt provided permission to discuss medical plan with the family. Opportunity was given to ask question and all questions were answered satisfactorily.   Disposition:  Pt is from Home, admitted with sepsis, Ecoli, still has diarrhea and electrolyte imbalance, which precludes a safe discharge. Discharge to home, when stable, may need few days to improve.  Subjective: No significant events overnight, patient feels weak and tired and still having some diarrhea.  Also complaining of generalized abdominal ache, no chest pain or pressure, no shortness of breath.  Physical Exam: General: NAD, lying comfortably Appear in no distress, affect appropriate Eyes: PERRLA ENT: Oral Mucosa Clear,  moist  Neck: no JVD,  Cardiovascular: S1 and S2 Present, no Murmur,  Respiratory: good respiratory effort, Bilateral Air entry equal and Decreased, no Crackles, no  wheezes Abdomen: Bowel Sound present, Soft and mild generalized tenderness,  Skin: no rashes Extremities: no Pedal edema, no calf tenderness Neurologic: without any new focal findings Gait not checked due to patient safety concerns  Vitals:   09/19/23 1045 09/19/23 1140 09/19/23 1149 09/19/23 1230  BP: (!) 111/41 (!) 92/46  (!) 95/51  Pulse: (!) 115 (!) 105  (!) 106  Resp: 20     Temp:   98.4 F (36.9 C)   TempSrc:   Oral   SpO2: 96% 99%  99%  Weight:      Height:        Intake/Output Summary (Last 24 hours) at 09/19/2023 1245 Last data filed at 09/19/2023 0544 Gross per 24 hour  Intake 400 ml  Output --  Net 400 ml   Filed Weights   09/18/23 1149  Weight: 79 kg    Data Reviewed: I have personally reviewed and interpreted daily labs, tele strips, imagings as discussed above. I reviewed all nursing notes, pharmacy notes, vitals, pertinent old records I have discussed plan of care as described above with RN and patient/family.  CBC: Recent Labs  Lab 09/18/23 1144 09/19/23 0150  WBC 5.6 4.8  NEUTROABS  --  3.9  HGB 13.9 12.1  HCT 42.7 36.6  MCV 89.0 89.7  PLT 243 196   Basic Metabolic Panel: Recent Labs  Lab 09/18/23 1144 09/19/23 0150  NA 139 135  K 3.5 3.3*  CL 102 100  CO2 24 19*  GLUCOSE 144* 230*  BUN 52* 54*  CREATININE 1.28* 1.51*  CALCIUM  7.9* 6.8*  MG  --  1.6*  PHOS  --  4.2    Studies: CT ABDOMEN PELVIS W CONTRAST Result Date: 09/18/2023 CLINICAL DATA:  Abdominal pain. EXAM: CT ABDOMEN AND PELVIS WITH CONTRAST TECHNIQUE: Multidetector CT imaging of the abdomen and pelvis was performed using the standard protocol following bolus administration of intravenous contrast. RADIATION DOSE REDUCTION: This exam was performed according to the departmental dose-optimization program which includes automated exposure control, adjustment of the mA and/or kV according to patient size and/or use of iterative reconstruction technique. CONTRAST:   OMNIPAQUE  IOHEXOL  300 MG/ML  SOLN COMPARISON:  CT abdomen pelvis dated 02/05/2023. FINDINGS: Lower chest: The visualized lung bases are clear. No intra-abdominal free air or free fluid. Hepatobiliary: The liver is unremarkable. No biliary dilatation. Cholecystectomy. No retained calcified stone noted in the central CBD. Pancreas: Unremarkable. No pancreatic ductal dilatation or surrounding inflammatory changes. Spleen: Normal in size without focal abnormality. Adrenals/Urinary Tract: The adrenal glands are unremarkable. Mild fullness of the right renal collecting system similar to prior CT. A transition noted at the ureteropelvic junction. No obstructing stone. Underlying stricture is not excluded. The left kidney is unremarkable. The visualized ureters and urinary bladder appear unremarkable. Stomach/Bowel: There is loose stool in the proximal colon. Formed stool noted in the distal colon. There is no bowel obstruction or active inflammation. No CT evidence of acute appendicitis. Vascular/Lymphatic: Mild aortoiliac atherosclerotic disease. The IVC is unremarkable. No portal venous gas. There is no adenopathy. Reproductive: Hysterectomy.  No suspicious adnexal masses. Other: None Musculoskeletal: Mild degenerative changes. Sacral stimulator with battery pack in the subcutaneous soft tissues of the right buttock. No acute osseous pathology. IMPRESSION: 1. No acute intra-abdominal or pelvic pathology. 2.  Aortic Atherosclerosis (ICD10-I70.0). Electronically Signed  By: Angus Bark M.D.   On: 09/18/2023 16:29   CT Head Wo Contrast Result Date: 09/18/2023 CLINICAL DATA:  Worsening headache, dizziness EXAM: CT HEAD WITHOUT CONTRAST TECHNIQUE: Contiguous axial images were obtained from the base of the skull through the vertex without intravenous contrast. RADIATION DOSE REDUCTION: This exam was performed according to the departmental dose-optimization program which includes automated exposure control, adjustment  of the mA and/or kV according to patient size and/or use of iterative reconstruction technique. COMPARISON:  04/17/2023 FINDINGS: Brain: No evidence of acute infarction, hemorrhage, mass, mass effect, or midline shift. No hydrocephalus or extra-axial fluid collection. Basal ganglia calcifications. Vascular: No hyperdense vessel. Skull: Negative for fracture or focal lesion. Sinuses/Orbits: No acute finding. Other: The mastoid air cells are well aerated. IMPRESSION: No acute intracranial process. Electronically Signed   By: Zoila Hines M.D.   On: 09/18/2023 15:51    Scheduled Meds:  atorvastatin   80 mg Oral QHS   busPIRone   30 mg Oral BID   citalopram   20 mg Oral BH-q7a   enoxaparin  (LOVENOX ) injection  0.5 mg/kg Subcutaneous Q24H   gabapentin   600 mg Oral QHS   insulin  aspart  0-20 Units Subcutaneous TID WC   insulin  glargine-yfgn  20 Units Subcutaneous QHS   lamoTRIgine   200 mg Oral BID   levothyroxine   88 mcg Oral Q0600   loratadine   10 mg Oral Daily   midodrine   10 mg Oral TID WC   pantoprazole   40 mg Oral BID   QUEtiapine   300 mg Oral QHS   saccharomyces boulardii  250 mg Oral BID   sodium chloride  flush  3 mL Intravenous Q12H   Continuous Infusions:  lactated ringers  100 mL/hr at 09/19/23 1054   [START ON 09/21/2023] levofloxacin  (LEVAQUIN ) IV     PRN Meds: acetaminophen  **OR** acetaminophen , ondansetron  (ZOFRAN ) IV  Time spent: 55 minutes  Author: Althia Atlas. MD [facility] 09/19/2023 12:45 PM  To reach On-call, see care teams to locate the attending and reach out to them via [email or web address]. If 7PM-7AM, please contact night-coverage If you still have difficulty reaching the attending provider, please page the [facility] (Director on Call) for [facility] on amion for assistance.

## 2023-09-19 NOTE — ED Notes (Signed)
 Pt oriented to self place pt in brief states I went to the bathroom but it's ok I can just wait pt 22 g piv to LAC attempting to infuse 1L .9  ns however pt continues to bed extremity unable to draw blood from current 22g PIV flushes without infiltration pt noted to have liquid light brown stool pooling in stretcher and is covered in stool from thighs to breasts monitor cords coated with stool ED physician notified PPE set up outside of room for staff protection sign placed on door

## 2023-09-19 NOTE — ED Notes (Signed)
 Pt assisted to [facility] for diarrhea episode

## 2023-09-19 NOTE — ED Notes (Signed)
 Pt asssisted to bedside commode with little assistance. Small amount of liquid stool in commode.

## 2023-09-19 NOTE — ED Notes (Signed)
 Pt up to bs commode and has yellow liquid stool output pt assisted into stretcher O2 remains in place via [place] 3L pt rpeorts comfort

## 2023-09-19 NOTE — ED Notes (Signed)
 Pt had one incontinent episode of diarrhea, and another continent episode in [facility]. Linen and gown changed.

## 2023-09-20 DIAGNOSIS — A419 Sepsis, unspecified organism: Secondary | ICD-10-CM | POA: Diagnosis not present

## 2023-09-20 LAB — GLUCOSE, CAPILLARY
Glucose-Capillary: 144 mg/dL — ABNORMAL HIGH (ref 70–99)
Glucose-Capillary: 120 mg/dL — ABNORMAL HIGH (ref 70–99)

## 2023-09-20 LAB — BASIC METABOLIC PANEL
Anion gap: 12 (ref 5–15)
BUN: 24 mg/dL — ABNORMAL HIGH (ref 6–20)
CO2: 24 mmol/L (ref 22–32)
Calcium: 7.3 mg/dL — ABNORMAL LOW (ref 8.9–10.3)
Chloride: 107 mmol/L (ref 98–111)
Creatinine, Ser: 0.9 mg/dL (ref 0.44–1.00)
GFR, Estimated: >60 mL/min (ref >60)
Glucose, Bld: 84 mg/dL (ref 70–99)
Potassium: 2.8 mmol/L — ABNORMAL LOW (ref 3.5–5.1)
Sodium: 143 mmol/L (ref 135–145)

## 2023-09-20 LAB — CBC
HCT: 35.8 % — ABNORMAL LOW (ref 36.0–46.0)
Hemoglobin: 11 g/dL — ABNORMAL LOW (ref 12.0–15.0)
MCH: 29 pg (ref 26.0–34.0)
MCHC: 30.7 g/dL (ref 30.0–36.0)
MCV: 94.5 fL (ref 80.0–100.0)
Platelets: 139 10*3/uL — ABNORMAL LOW (ref 150–400)
RBC: 3.79 MIL/uL — ABNORMAL LOW (ref 3.87–5.11)
RDW: 14 % (ref 11.5–15.5)
WBC: 4.8 10*3/uL (ref 4.0–10.5)
nRBC: 0 % (ref 0.0–0.2)

## 2023-09-20 LAB — MAGNESIUM: Magnesium: 2.4 mg/dL (ref 1.7–2.4)

## 2023-09-20 LAB — CBG MONITORING, ED
Glucose-Capillary: 84 mg/dL (ref 70–99)
Glucose-Capillary: 138 mg/dL — ABNORMAL HIGH (ref 70–99)

## 2023-09-20 LAB — PHOSPHORUS: Phosphorus: 1.7 mg/dL — ABNORMAL LOW (ref 2.5–4.6)

## 2023-09-20 MED ORDER — LACTATED RINGERS IV SOLN: INTRAVENOUS | Status: AC

## 2023-09-20 MED ORDER — POTASSIUM CHLORIDE CRYS ER 20 MEQ PO TBCR
40.0000 meq | EXTENDED_RELEASE_TABLET | ORAL | Status: AC
  Administered 2023-09-20 (×2): 40 meq via ORAL
  Filled 2023-09-20 (×2): qty 2

## 2023-09-20 MED ORDER — POTASSIUM PHOSPHATES 15 MMOLE/5ML IV SOLN
30.0000 mmol | Freq: Once | INTRAVENOUS | Status: AC
  Administered 2023-09-20: 30 mmol via INTRAVENOUS
  Filled 2023-09-20: qty 10

## 2023-09-20 MED ORDER — POTASSIUM & SODIUM PHOSPHATES 280-160-250 MG PO PACK
2.0000 | PACK | ORAL | Status: DC
  Filled 2023-09-20 (×4): qty 2

## 2023-09-20 MED ORDER — HYALURONIDASE HUMAN 150 UNIT/ML IJ SOLN
150.0000 [IU] | Freq: Once | INTRAMUSCULAR | Status: AC
  Administered 2023-09-20: 150 [IU] via SUBCUTANEOUS
  Filled 2023-09-20: qty 1

## 2023-09-20 MED ORDER — MIDODRINE HCL 5 MG PO TABS
5.0000 mg | ORAL_TABLET | Freq: Three times a day (TID) | ORAL | Status: DC
  Administered 2023-09-20: 5 mg via ORAL
  Filled 2023-09-20 (×3): qty 1

## 2023-09-20 MED ORDER — POTASSIUM CHLORIDE 10 MEQ/100ML IV SOLN
10.0000 meq | INTRAVENOUS | Status: AC
Start: 2023-09-20 — End: 2023-09-20
  Administered 2023-09-20 (×4): 10 meq via INTRAVENOUS
  Filled 2023-09-20 (×3): qty 100

## 2023-09-20 MED ORDER — POTASSIUM PHOSPHATES 15 MMOLE/5ML IV SOLN
30.0000 mmol | Freq: Once | INTRAVENOUS | Status: DC
  Administered 2023-09-20: 30 mmol via INTRAVENOUS
  Filled 2023-09-20: qty 10

## 2023-09-20 MED ORDER — POTASSIUM PHOSPHATES 15 MMOLE/5ML IV SOLN: 30.0000 mmol | Freq: Once | INTRAVENOUS | Status: DC

## 2023-09-20 NOTE — TOC Initial Note (Signed)
Transition of Care [facility]) - Initial/Assessment Note    Patient Details  Name: [name] MRN: [id number] Date of Birth: [birth year]  Transition of Care [facility]) CM/SW Contact:    Tory Emerald, LCSW Phone Number: 09/20/2023, 1:41 PM  Clinical Narrative:                  CSW completed TOC assessment and high risk readmission assessment with pt. No TOC needs noted at this time.   Admitted for: Septicemia Admitted from: Home  PCP: Dr. Carlynn Purl  Ph[facility]  Current home health/prior home health/DME: Pt has a cane, walker, shower chair, raised toilet seat, but reports she only uses the raised toilet seat.         Patient Goals and CMS Choice Patient states their goals for this hospitalization and ongoing recovery are:: Pt would like to take better care of herself          Expected Discharge Plan and Services       Living arrangements for the past 2 months: Single Family Home                                      Prior Living Arrangements/Services Living arrangements for the past 2 months: Single Family Home Lives with:: Spouse, Other (Comment) (Pt's granddaughter) Patient language and need for interpreter reviewed:: Yes Do you feel safe going back to the place where you live?: Yes      Need for Family Participation in Patient Care: Yes (Comment) Care giver support system in place?: Yes (comment) Current home services: DME (Pt has a cane, walker, shower chair, raised toilet seat) Criminal Activity/Legal Involvement Pertinent to Current Situation/Hospitalization: No - Comment as needed  Activities of Daily Living      Permission Sought/Granted   Permission granted to share information with : Yes, Verbal Permission Granted     Permission granted to share info w AGENCY: [facility]        Emotional Assessment   Attitude/Demeanor/Rapport: Engaged Affect (typically observed): Adaptable Orientation: : Oriented to Self, Oriented to Place, Oriented to   Time, Oriented to Situation Alcohol / Substance Use: Not Applicable Psych Involvement: No (comment)  Admission diagnosis:  Acute diarrhea [R19.7] Sepsis (HCC) [A41.9] Sepsis due to Escherichia coli (E. coli) (HCC) [A41.51] Patient Active Problem List   Diagnosis Date Noted   Sepsis due to Escherichia coli (E. coli) (HCC) 09/19/2023   Pyuria 09/18/2023   Sepsis (HCC) 09/18/2023   Acquired hypothyroidism 06/20/2023   Class 2 severe obesity with serious comorbidity and body mass index (BMI) of 35.0 to 35.9 in adult [facility]) 06/19/2023   Type 2 diabetes mellitus with hyperglycemia, with long-term current use of insulin (HCC) 06/18/2023   Insulin dependent type 2 diabetes mellitus (HCC) 03/19/2023   Bacteremia due to Escherichia coli 09/13/2022   Encephalopathy acute 09/13/2022   Severe sepsis (HCC) 09/10/2022   Vertigo 02/10/2022   Abnormal urinalysis 02/10/2022   TIA (transient ischemic attack) 10/04/2021   HTN (hypertension) 10/04/2021   Chronic kidney disease, stage 3a (HCC) 10/04/2021   Depression with anxiety 10/04/2021   COPD (chronic obstructive pulmonary disease) (HCC) 10/04/2021   CAD (coronary artery disease) 10/04/2021   Chronic diastolic CHF (congestive heart failure) (HCC) 10/04/2021   Type II diabetes mellitus with renal manifestations (HCC) 06/30/2021   Interstitial cystitis 06/30/2021   Atherosclerosis of aorta (HCC) 03/28/2021   Iron deficiency anemia due  to chronic blood loss    Polyp of ascending colon    Vitamin B12 deficiency 08/08/2018   Patient is full code 10/30/2017   Coronary artery disease of native artery of native heart with stable angina pectoris (HCC) 10/09/2017   Plantar fascial fibromatosis 10/09/2017   Tendinitis of wrist 10/09/2017   Schizoaffective disorder, bipolar type (HCC) 09/28/2017   Adenomatous colon polyp 10/15/2015   Bipolar affective disorder (HCC) 10/15/2015   Constipation 10/15/2015   HLD (hyperlipidemia) 10/15/2015   NASH  (nonalcoholic steatohepatitis) 10/15/2015   Obesity (BMI 35.0-39.9 without comorbidity) 10/15/2015   Restless leg syndrome 10/15/2015   Low back pain with left-sided sciatica 08/18/2015   Apnea, sleep 05/05/2015   Heart valve disease 05/05/2015   Complicated UTI (urinary tract infection) 03/24/2015   CKD stage 3 due to type 2 diabetes mellitus (HCC) 03/15/2015   Migraine 03/12/2015   Chronic bronchitis (HCC) 01/20/2015   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HCC) 01/20/2015   GERD (gastroesophageal reflux disease) 01/20/2015   Hypothyroid    Vitamin D deficiency    Urinary incontinence    Liver lesion    Hypomagnesemia    Obstructive sleep apnea 10/20/2013   Hereditary and idiopathic peripheral neuropathy 03/04/2009   Hemorrhoids, external 09/03/2008   Allergic rhinitis 07/04/2007   Interstitial cystitis 07/04/2007   PCP:  Alba Cory, MD Pharmacy:   [facility] - [place], [place] - [address] [place] [address] Phone: [phone] Fax: [phone]  [id number] [place], [place] - [address] AT [facility] [address] [place] [address] [place] [address] Phone: [phone] Fax: [phone]     Social Drivers of Health (SDOH) Social History: SDOH Screenings   Food Insecurity: No Food Insecurity (06/18/2023)   Received from [facility]  Housing: Unknown (06/18/2023)   Received from [facility]  Transportation Needs: No Transportation Needs (06/18/2023)   Received from [facility]  Utilities: Not At Risk (06/18/2023)   [facility]  Alcohol Screen: Low Risk  (11/17/2022)  Depression (PHQ2-9): Medium Risk (06/20/2023)  Financial Resource Strain: Low Risk  (06/18/2023)   Received from [facility]  Physical Activity: Inactive (02/02/2022)  Social Connections: Moderately Isolated (02/02/2022)  Stress: No  Stress Concern Present (02/02/2022)  Tobacco Use: Low Risk  (09/18/2023)   SDOH Interventions:     Readmission Risk Interventions    09/14/2022   11:31 AM 09/12/2022   11:51 AM  Readmission Risk Prevention Plan  Transportation Screening  Complete  PCP or Specialist Appt within 3-5 Days --   HRI or Home Care Consult  Complete  Social Work Consult for Recovery Care Planning/Counseling  Complete  Palliative Care Screening  Not Applicable  Medication Review [employer])  Complete

## 2023-09-20 NOTE — Progress Notes (Signed)
[facility] Progress Note  Patient: [name]    ZOX:[id number]  DOA: 09/18/2023     Date of Service: the patient was seen and examined on 09/20/2023  Chief Complaint  Patient presents with   Emesis   Dizziness   Brief hospital course:  [name] is a 58 y.o. female with medical history significant of Insulin-dependent type 2 diabetes, HFpEF, OSA, nonobstructive CAD, TIA, hypothyroidism, hypertension, hyperlipidemia, schizoaffective disorder, who presents to the ED due to nausea.   [name] states that in the last 24 hours, she has developed multiple episodes of nausea and vomiting.  Then today, she has began to experience diarrhea.  She states her emesis and diarrhea have been nonbloody, nonmelanotic.  She has not noted any fevers at home.  She denies any cough, rhinorrhea, shortness of breath, chest pain or palpitations.   ED course: On arrival to the ED, patient was hypotensive 86/67 with heart rate of 98.  She was saturating at 99% on room air.  She was afebrile at 98.7.  Initial workup notable for normal CBC, BUN 52, creatinine 1.28, and GFR 48.  Urinalysis with glucosuria, increased Pacific gravity many bacteria but no leukocytes, nitrites or WBC.  CT of the abdomen and CT head were obtained with no acute abnormalities.  Patient started on IV fluids, Zofran, Rocephin and Tylenol.  TRH contacted for admission.   Assessment and Plan:  # Severe sepsis secondary to E. coli gastroenteritis GI pathogen positive for E. Coli CT scan negative for any acute findings Patient meets severe sepsis criteria, fever, tachycardia, hypotension, E. coli positive. S/p ceftriaxone and Levaquin given in the ED Started Levaquin 750 mg every 48 hour as per renal functions Continue IV fluid for hydration Started midodrine for pressure support Monitor BP and titrate medications accordingly Started probiotics again PPI for GI prophylaxis   # Hypokalemia, potassium repleted. #  Hypophosphatemia due to poor nutrition.  Phos repleted. # Metabolic acidosis, bicarb repleted # Hypomagnesemia, mag repleted. Monitor electrolytes and replete as needed.   # Pyuria, Less likely UTI Urine negative for LE and nitrite, bacteria many. Continue above antibiotics   # AKI, Resolved  Baseline creatinine 1.03, EGFR >60 1/15 Cr 1.51 elevated most likely prerenal due to dehydration Continue IV fluid, monitor renal functions and urine output Daily  # Hypertension, HLD Presented with sepsis and hypotension, held antihypertensive medications for now Continue Lipitor  # IDDM T2 Held home medications for now Continue Semglee 20 units nightly Continue NovoLog sliding scale, monitor CBG Continue diabetic diet  # Depression Continue BuSpar, Celexa and Seroquel Home dose Continued gabapentin and Lamictal home dose  # Hypothyroid, continue Synthroid  # Hypotension, started midodrine 5 mg p.o. 3 times daily with holding parameters Monitor BP and titrate medications accordingly   Body mass index is 35.18 kg/m.  Interventions:  Diet: soft diet DVT Prophylaxis: Subcutaneous Lovenox   Advance goals of care discussion: Full code  Family Communication: family was not present at bedside, at the time of interview.  The pt provided permission to discuss medical plan with the family. Opportunity was given to ask question and all questions were answered satisfactorily.   Disposition:  Pt is from Home, admitted with sepsis, Ecoli, still has diarrhea and electrolyte imbalance, which precludes a safe discharge. Discharge to home, when stable, may need few days to improve.  Subjective: No significant events overnight, patient had diarrhea 1 episode today morning and 3 episodes yesterday, feeling chilly and cold due to  low room temperature. Patient denied any abdominal pain, no chest pain or palpitation, no shortness of breath. RN was advised to adjust the thermostat and increased  temperature.   Physical Exam: General: NAD, lying comfortably Appear in no distress, affect appropriate Eyes: PERRLA ENT: Oral Mucosa Clear, moist  Neck: no JVD,  Cardiovascular: S1 and S2 Present, no Murmur,  Respiratory: good respiratory effort, Bilateral Air entry equal and Decreased, no Crackles, no wheezes Abdomen: Bowel Sound present, Soft and mild generalized tenderness,  Skin: no rashes Extremities: no Pedal edema, no calf tenderness Neurologic: without any new focal findings Gait not checked due to patient safety concerns  Vitals:   09/20/23 1615 09/20/23 1623 09/20/23 1625 09/20/23 1720  BP:   (!) 144/67 (!) 143/74  Pulse: 88  88 82  Resp:    16  Temp:  99.1 F (37.3 C)  98.6 F (37 C)  TempSrc:  Oral    SpO2: 96%  96% 97%  Weight:      Height:       No intake or output data in the 24 hours ending 09/20/23 1803  Filed Weights   09/18/23 1149  Weight: 79 kg    Data Reviewed: I have personally reviewed and interpreted daily labs, tele strips, imagings as discussed above. I reviewed all nursing notes, pharmacy notes, vitals, pertinent old records I have discussed plan of care as described above with RN and patient/family.  CBC: Recent Labs  Lab 09/18/23 1144 09/19/23 0150 09/20/23 0320  WBC 5.6 4.8 4.8  NEUTROABS  --  3.9  --   HGB 13.9 12.1 11.0*  HCT 42.7 36.6 35.8*  MCV 89.0 89.7 94.5  PLT 243 196 139*   Basic Metabolic Panel: Recent Labs  Lab 09/18/23 1144 09/19/23 0150 09/20/23 0320  NA 139 135 143  K 3.5 3.3* 2.8*  CL 102 100 107  CO2 24 19* 24  GLUCOSE 144* 230* 84  BUN 52* 54* 24*  CREATININE 1.28* 1.51* 0.90  CALCIUM 7.9* 6.8* 7.3*  MG  --  1.6* 2.4  PHOS  --  4.2 1.7*    Studies: No results found.   Scheduled Meds:  atorvastatin  80 mg Oral QHS   busPIRone  30 mg Oral BID   citalopram  20 mg Oral BH-q7a   enoxaparin (LOVENOX) injection  0.5 mg/kg Subcutaneous Q24H   gabapentin  600 mg Oral QHS   insulin aspart  0-20  Units Subcutaneous TID WC   insulin glargine-yfgn  20 Units Subcutaneous QHS   lamoTRIgine  200 mg Oral BID   levothyroxine  88 mcg Oral Q0600   loratadine  10 mg Oral Daily   midodrine  5 mg Oral TID WC   pantoprazole  40 mg Oral BID   potassium chloride  40 mEq Oral Q4H   QUEtiapine  300 mg Oral QHS   saccharomyces boulardii  250 mg Oral BID   sodium chloride flush  3 mL Intravenous Q12H   Continuous Infusions:  lactated ringers     [START ON 09/21/2023] levofloxacin (LEVAQUIN) IV     potassium PHOSPHATE 30 mmol in dextrose 5 % 250 mL infusion 30 mmol (09/20/23 1623)   PRN Meds: acetaminophen **OR** acetaminophen, ondansetron (ZOFRAN) IV  Time spent: 55 minutes  Author: Gillis Santa. MD [facility] 09/20/2023 6:03 PM  To reach On-call, see care teams to locate the attending and reach out to them via [email or web address]. If 7PM-7AM, please contact night-coverage If you still have difficulty reaching  the attending provider, please page the [facility] (Director on Call) for [facility] on amion for assistance.

## 2023-09-20 NOTE — Plan of Care (Signed)
  Problem: Education: Goal: Ability to describe self-care measures that may prevent or decrease complications (Diabetes Survival Skills Education) will improve Outcome: Progressing   Problem: Coping: Goal: Ability to adjust to condition or change in health will improve Outcome: Progressing   Problem: Metabolic: Goal: Ability to maintain appropriate glucose levels will improve Outcome: Progressing   Problem: Nutritional: Goal: Maintenance of adequate nutrition will improve Outcome: Progressing   Problem: Skin Integrity: Goal: Risk for impaired skin integrity will decrease Outcome: Progressing   

## 2023-09-21 ENCOUNTER — Other Ambulatory Visit: Payer: Self-pay | Admitting: Family Medicine

## 2023-09-21 DIAGNOSIS — A419 Sepsis, unspecified organism: Secondary | ICD-10-CM | POA: Diagnosis not present

## 2023-09-21 LAB — BASIC METABOLIC PANEL
Anion gap: 8 (ref 5–15)
BUN: 12 mg/dL (ref 6–20)
CO2: 22 mmol/L (ref 22–32)
Calcium: 7.3 mg/dL — ABNORMAL LOW (ref 8.9–10.3)
Chloride: 110 mmol/L (ref 98–111)
Creatinine, Ser: 0.82 mg/dL (ref 0.44–1.00)
GFR, Estimated: >60 mL/min (ref >60)
Glucose, Bld: 119 mg/dL — ABNORMAL HIGH (ref 70–99)
Potassium: 4.3 mmol/L (ref 3.5–5.1)
Sodium: 140 mmol/L (ref 135–145)

## 2023-09-21 LAB — GLUCOSE, CAPILLARY
Glucose-Capillary: 121 mg/dL — ABNORMAL HIGH (ref 70–99)
Glucose-Capillary: 144 mg/dL — ABNORMAL HIGH (ref 70–99)
Glucose-Capillary: 156 mg/dL — ABNORMAL HIGH (ref 70–99)

## 2023-09-21 LAB — CBC
HCT: 32.1 % — ABNORMAL LOW (ref 36.0–46.0)
Hemoglobin: 10.3 g/dL — ABNORMAL LOW (ref 12.0–15.0)
MCH: 28.9 pg (ref 26.0–34.0)
MCHC: 32.1 g/dL (ref 30.0–36.0)
MCV: 90.2 fL (ref 80.0–100.0)
Platelets: 166 10*3/uL (ref 150–400)
RBC: 3.56 MIL/uL — ABNORMAL LOW (ref 3.87–5.11)
RDW: 14.1 % (ref 11.5–15.5)
WBC: 4.7 10*3/uL (ref 4.0–10.5)
nRBC: 0 % (ref 0.0–0.2)

## 2023-09-21 LAB — PHOSPHORUS: Phosphorus: 1.5 mg/dL — ABNORMAL LOW (ref 2.5–4.6)

## 2023-09-21 LAB — MAGNESIUM: Magnesium: 2.1 mg/dL (ref 1.7–2.4)

## 2023-09-21 MED ORDER — ALUM & MAG HYDROXIDE-SIMETH 200-200-20 MG/5ML PO SUSP: 15.0000 mL | Freq: Four times a day (QID) | ORAL | Status: DC | PRN

## 2023-09-21 MED ORDER — POTASSIUM PHOSPHATES 15 MMOLE/5ML IV SOLN
30.0000 mmol | Freq: Once | INTRAVENOUS | Status: AC
  Administered 2023-09-21: 30 mmol via INTRAVENOUS
  Filled 2023-09-21: qty 10

## 2023-09-21 MED ORDER — MIDODRINE HCL 5 MG PO TABS: 5.0000 mg | ORAL_TABLET | Freq: Three times a day (TID) | ORAL | Status: DC | PRN

## 2023-09-21 MED ORDER — SIMETHICONE 80 MG PO CHEW
160.0000 mg | CHEWABLE_TABLET | Freq: Once | ORAL | Status: AC
  Administered 2023-09-21: 160 mg via ORAL
  Filled 2023-09-21: qty 2

## 2023-09-21 MED ORDER — POTASSIUM PHOSPHATES 15 MMOLE/5ML IV SOLN: 30.0000 mmol | Freq: Once | INTRAVENOUS | Status: DC

## 2023-09-21 MED ORDER — LEVOFLOXACIN 500 MG PO TABS
500.0000 mg | ORAL_TABLET | Freq: Every day | ORAL | Status: DC
  Administered 2023-09-22: 500 mg via ORAL
  Filled 2023-09-21: qty 1

## 2023-09-21 NOTE — Care Management Important Message (Signed)
Important Message  Patient Details  Name: [name] MRN: [id number] Date of Birth: July 19, 1966   Important Message Given:  Yes - Medicare IM     Cristela Blue, CMA 09/21/2023, 11:55 AM

## 2023-09-21 NOTE — Consult Note (Signed)
[facility] Liaison Note  09/21/2023  BIRDA FRYE [birth year] [id number]  Location: RN Hospital Liaison screened the patient remotely at [facility].  Insurance: [employer] Medicare Advantage   TINZLEY KERSHAW is a 58 y.o. female who is a Primary Care Patient of Alba Cory, MD-[facility]. The patient was screened for readmission hospitalization with noted high risk score for unplanned readmission risk with 1 IP/3 ED in 6 months.  The patient was assessed for potential Care Management service needs for post hospital transition for care coordination. Review of patient's electronic medical record reveals patient was admitted for Sepsis. No anticipated any needs post hospital discharge. Pt currently active with [facility] services.   Plan: [facility] Liaison will continue to follow progress and disposition to asess for post hospital community care coordination/management needs.  Referral request for community care coordination: pending disposition.   [facility] Care Management/Population Health does not replace or interfere with any arrangements made by the Inpatient Transition of Care team.   For questions contact:   Elliot Cousin, RN, [facility] Liaison [place]   [facility], Population Health Office Hours MTWF  8:00 am-6:00 pm Direct Dial: [phone] mobile [phone] [Office toll free line] Office Hours are M-F 8:30 - 5 pm [email or web address]

## 2023-09-21 NOTE — Plan of Care (Signed)
  Problem: Education: Goal: Knowledge of General Education information will improve Description: Including pain rating scale, medication(s)/side effects and non-pharmacologic comfort measures Outcome: Progressing   Problem: Activity: Goal: Risk for activity intolerance will decrease Outcome: Progressing   Problem: Nutrition: Goal: Adequate nutrition will be maintained Outcome: Progressing   

## 2023-09-21 NOTE — Progress Notes (Signed)
[facility] Progress Note  Patient: [name]    ZOX:[id number]  DOA: 09/18/2023     Date of Service: the patient was seen and examined on 09/21/2023  Chief Complaint  Patient presents with   Emesis   Dizziness   Brief hospital course:  [name] is a 58 y.o. female with medical history significant of Insulin-dependent type 2 diabetes, HFpEF, OSA, nonobstructive CAD, TIA, hypothyroidism, hypertension, hyperlipidemia, schizoaffective disorder, who presents to the ED due to nausea.   [name] states that in the last 24 hours, she has developed multiple episodes of nausea and vomiting.  Then today, she has began to experience diarrhea.  She states her emesis and diarrhea have been nonbloody, nonmelanotic.  She has not noted any fevers at home.  She denies any cough, rhinorrhea, shortness of breath, chest pain or palpitations.   ED course: On arrival to the ED, patient was hypotensive 86/67 with heart rate of 98.  She was saturating at 99% on room air.  She was afebrile at 98.7.  Initial workup notable for normal CBC, BUN 52, creatinine 1.28, and GFR 48.  Urinalysis with glucosuria, increased Pacific gravity many bacteria but no leukocytes, nitrites or WBC.  CT of the abdomen and CT head were obtained with no acute abnormalities.  Patient started on IV fluids, Zofran, Rocephin and Tylenol.  [facility] contacted for admission.   Assessment and Plan:  # Severe sepsis secondary to E. coli gastroenteritis GI pathogen positive for E. Coli CT scan negative for any acute findings Patient meets severe sepsis criteria, fever, tachycardia, hypotension, E. coli positive. S/p ceftriaxone and Levaquin given in the ED Started Levaquin 750 mg every 48 hour as per renal functions Continue IV fluid for hydration Started midodrine for pressure support Monitor BP and titrate medications accordingly Started probiotics again PPI for GI prophylaxis   # Hypokalemia, potassium repleted. #  Hypophosphatemia due to poor nutrition.  Phos repleted. # Metabolic acidosis, bicarb repleted. Resolved  # Hypomagnesemia, mag repleted. Resolved  Monitor electrolytes and replete as needed.   # Pyuria, Less likely UTI Urine negative for LE and nitrite, bacteria many. Continue above antibiotics   # AKI, Resolved  Baseline creatinine 1.03, EGFR >60 1/15 Cr 1.51 elevated most likely prerenal due to dehydration Continue IV fluid, monitor renal functions and urine output Daily  # Hypertension, HLD Presented with sepsis and hypotension, held antihypertensive medications for now Continue Lipitor  # IDDM T2 Held home medications for now Continue Semglee 20 units nightly Continue NovoLog sliding scale, monitor CBG Continue diabetic diet  # Depression Continue BuSpar, Celexa and Seroquel Home dose Continued gabapentin and Lamictal home dose  # Hypothyroid, continue Synthroid  # Hypotension, BP improved s/p midodrine and IVF  Changed midodrine to as needed Monitor BP and titrate medications accordingly   Body mass index is 35.18 kg/m.  Interventions:  Diet: soft diet DVT Prophylaxis: Subcutaneous Lovenox   Advance goals of care discussion: Full code  Family Communication: family was not present at bedside, at the time of interview.  The pt provided permission to discuss medical plan with the family. Opportunity was given to ask question and all questions were answered satisfactorily.   Disposition:  Pt is from Home, admitted with sepsis, Ecoli, still has diarrhea and electrolyte imbalance, which precludes a safe discharge. Discharge to home, when stable, may need few days to improve.  Subjective: No significant events overnight, diet is improving, patient had 2 episodes today morning and told 4  episodes of diarrhea yesterday.  Mild abdominal tenderness, no any other complaints.  Physical Exam: General: NAD, lying comfortably Appear in no distress, affect  appropriate Eyes: PERRLA ENT: Oral Mucosa Clear, moist  Neck: no JVD,  Cardiovascular: S1 and S2 Present, no Murmur,  Respiratory: good respiratory effort, Bilateral Air entry equal and Decreased, no Crackles, no wheezes Abdomen: Bowel Sound present, Soft and mild generalized tenderness,  Skin: no rashes Extremities: no Pedal edema, no calf tenderness Neurologic: without any new focal findings Gait not checked due to patient safety concerns  Vitals:   09/20/23 1623 09/20/23 1625 09/20/23 1720 09/21/23 0852  BP:  (!) 144/67 (!) 143/74 122/60  Pulse:  88 82 81  Resp:   16 19  Temp: 99.1 F (37.3 C)  98.6 F (37 C) 99.1 F (37.3 C)  TempSrc: Oral     SpO2:  96% 97% 97%  Weight:      Height:        Intake/Output Summary (Last 24 hours) at 09/21/2023 1452 Last data filed at 09/21/2023 1431 Gross per 24 hour  Intake 1331.83 ml  Output --  Net 1331.83 ml    Filed Weights   09/18/23 1149  Weight: 79 kg    Data Reviewed: I have personally reviewed and interpreted daily labs, tele strips, imagings as discussed above. I reviewed all nursing notes, pharmacy notes, vitals, pertinent old records I have discussed plan of care as described above with RN and patient/family.  CBC: Recent Labs  Lab 09/18/23 1144 09/19/23 0150 09/20/23 0320 09/21/23 0522  WBC 5.6 4.8 4.8 4.7  NEUTROABS  --  3.9  --   --   HGB 13.9 12.1 11.0* 10.3*  HCT 42.7 36.6 35.8* 32.1*  MCV 89.0 89.7 94.5 90.2  PLT 243 196 139* 166   Basic Metabolic Panel: Recent Labs  Lab 09/18/23 1144 09/19/23 0150 09/20/23 0320 09/21/23 0522  NA 139 135 143 140  K 3.5 3.3* 2.8* 4.3  CL 102 100 107 110  CO2 24 19* 24 22  GLUCOSE 144* 230* 84 119*  BUN 52* 54* 24* 12  CREATININE 1.28* 1.51* 0.90 0.82  CALCIUM 7.9* 6.8* 7.3* 7.3*  MG  --  1.6* 2.4 2.1  PHOS  --  4.2 1.7* 1.5*    Studies: No results found.   Scheduled Meds:  atorvastatin  80 mg Oral QHS   busPIRone  30 mg Oral BID   citalopram  20 mg  Oral BH-q7a   enoxaparin (LOVENOX) injection  0.5 mg/kg Subcutaneous Q24H   gabapentin  600 mg Oral QHS   insulin aspart  0-20 Units Subcutaneous TID WC   insulin glargine-yfgn  20 Units Subcutaneous QHS   lamoTRIgine  200 mg Oral BID   [START ON 09/22/2023] levofloxacin  500 mg Oral Daily   levothyroxine  88 mcg Oral Q0600   loratadine  10 mg Oral Daily   pantoprazole  40 mg Oral BID   QUEtiapine  300 mg Oral QHS   saccharomyces boulardii  250 mg Oral BID   sodium chloride flush  3 mL Intravenous Q12H   Continuous Infusions:  lactated ringers 50 mL/hr at 09/21/23 0319   potassium PHOSPHATE 30 mmol in dextrose 5 % 250 mL infusion 30 mmol (09/21/23 1230)   PRN Meds: acetaminophen **OR** acetaminophen, midodrine, ondansetron (ZOFRAN) IV  Time spent: 40 minutes  Author: Gillis Santa. MD [facility] 09/21/2023 2:52 PM  To reach On-call, see care teams to locate the attending and reach  out to them via [email or web address]. If 7PM-7AM, please contact night-coverage If you still have difficulty reaching the attending provider, please page the [place] (Director on Call) for [facility] on amion for assistance.

## 2023-09-21 NOTE — Plan of Care (Signed)
  Problem: Education: Goal: Ability to describe self-care measures that may prevent or decrease complications (Diabetes Survival Skills Education) will improve Outcome: Progressing Goal: Individualized Educational Video(s) Outcome: Progressing   Problem: Fluid Volume: Goal: Ability to maintain a balanced intake and output will improve Outcome: Progressing   Problem: Activity: Goal: Risk for activity intolerance will decrease Outcome: Progressing   Problem: Nutrition: Goal: Adequate nutrition will be maintained Outcome: Progressing   Problem: Pain Managment: Goal: General experience of comfort will improve and/or be controlled Outcome: Progressing

## 2023-09-22 DIAGNOSIS — A4151 Sepsis due to Escherichia coli [E. coli]: Secondary | ICD-10-CM

## 2023-09-22 DIAGNOSIS — N179 Acute kidney failure, unspecified: Secondary | ICD-10-CM

## 2023-09-22 DIAGNOSIS — R6520 Severe sepsis without septic shock: Secondary | ICD-10-CM | POA: Diagnosis not present

## 2023-09-22 LAB — BASIC METABOLIC PANEL
Anion gap: 4 — ABNORMAL LOW (ref 5–15)
BUN: 11 mg/dL (ref 6–20)
CO2: 28 mmol/L (ref 22–32)
Calcium: 8 mg/dL — ABNORMAL LOW (ref 8.9–10.3)
Chloride: 108 mmol/L (ref 98–111)
Creatinine, Ser: 0.9 mg/dL (ref 0.44–1.00)
GFR, Estimated: >60 mL/min (ref >60)
Glucose, Bld: 90 mg/dL (ref 70–99)
Potassium: 4.2 mmol/L (ref 3.5–5.1)
Sodium: 140 mmol/L (ref 135–145)

## 2023-09-22 LAB — MAGNESIUM: Magnesium: 1.8 mg/dL (ref 1.7–2.4)

## 2023-09-22 LAB — CBC
HCT: 33.4 % — ABNORMAL LOW (ref 36.0–46.0)
Hemoglobin: 10.6 g/dL — ABNORMAL LOW (ref 12.0–15.0)
MCH: 29 pg (ref 26.0–34.0)
MCHC: 31.7 g/dL (ref 30.0–36.0)
MCV: 91.5 fL (ref 80.0–100.0)
Platelets: 167 10*3/uL (ref 150–400)
RBC: 3.65 MIL/uL — ABNORMAL LOW (ref 3.87–5.11)
RDW: 13.8 % (ref 11.5–15.5)
WBC: 4.4 10*3/uL (ref 4.0–10.5)
nRBC: 0 % (ref 0.0–0.2)

## 2023-09-22 LAB — GLUCOSE, CAPILLARY
Glucose-Capillary: 90 mg/dL (ref 70–99)
Glucose-Capillary: 125 mg/dL — ABNORMAL HIGH (ref 70–99)

## 2023-09-22 LAB — PHOSPHORUS: Phosphorus: 2.3 mg/dL — ABNORMAL LOW (ref 2.5–4.6)

## 2023-09-22 MED ORDER — PANTOPRAZOLE SODIUM 40 MG PO TBEC: 40.0000 mg | DELAYED_RELEASE_TABLET | Freq: Every day | ORAL | 0 refills | Status: DC

## 2023-09-22 MED ORDER — K PHOS MONO-SOD PHOS DI & MONO 155-852-130 MG PO TABS
500.0000 mg | ORAL_TABLET | Freq: Four times a day (QID) | ORAL | Status: DC
  Administered 2023-09-22: 500 mg via ORAL
  Filled 2023-09-22: qty 2

## 2023-09-22 MED ORDER — LEVOFLOXACIN 500 MG PO TABS: 500.0000 mg | ORAL_TABLET | Freq: Every day | ORAL | 0 refills | Status: AC

## 2023-09-22 MED ORDER — SACCHAROMYCES BOULARDII 250 MG PO CAPS: 250.0000 mg | ORAL_CAPSULE | Freq: Two times a day (BID) | ORAL | 0 refills | Status: AC

## 2023-09-22 NOTE — Discharge Summary (Signed)
[facility] Discharge Summary   Patient: [name] NFA:[id number]  PCP: Alba Cory, MD  Date of admission: 09/18/2023   Date of discharge:  09/22/2023     Discharge Diagnoses:  Principal Problem:   Sepsis (HCC) Active Problems:   Pyuria   CKD stage 3 due to type 2 diabetes mellitus (HCC)   Obstructive sleep apnea   Type II diabetes mellitus with renal manifestations (HCC)   Hypothyroid   COPD (chronic obstructive pulmonary disease) (HCC)   Chronic diastolic CHF (congestive heart failure) (HCC)   Sepsis due to Escherichia coli (E. coli) (HCC)   Admitted From: Home Disposition:  Home   Recommendations for Outpatient Follow-up:  PCP: in 1 wk Follow up LABS/TEST:     Follow-up Information     Alba Cory, MD Follow up in 1 week(s).   Specialty: Family Medicine Contact information: [address] [place] [address] [phone]                Diet recommendation: Cardiac and Carb modified diet  Activity: The patient is advised to gradually reintroduce usual activities, as tolerated  Discharge Condition: stable  Code Status: Full code   History of present illness: As per the H and P dictated on admission Hospital Course:  [name] is a 58 y.o. female with medical history significant of Insulin-dependent type 2 diabetes, HFpEF, OSA, nonobstructive CAD, TIA, hypothyroidism, hypertension, hyperlipidemia, schizoaffective disorder, who presents to the ED due to nausea.   [name] states that in the last 24 hours, she has developed multiple episodes of nausea and vomiting.  Then today, she has began to experience diarrhea.  She states her emesis and diarrhea have been nonbloody, nonmelanotic.  She has not noted any fevers at home.  She denies any cough, rhinorrhea, shortness of breath, chest pain or palpitations.   ED course: On arrival to the ED, patient was hypotensive 86/67 with heart rate of 98.  She was saturating at 99%  on room air.  She was afebrile at 98.7.  Initial workup notable for normal CBC, BUN 52, creatinine 1.28, and GFR 48.  Urinalysis with glucosuria, increased Pacific gravity many bacteria but no leukocytes, nitrites or WBC.  CT of the abdomen and CT head were obtained with no acute abnormalities.  Patient started on IV fluids, Zofran, Rocephin and Tylenol.  [facility] contacted for admission.     Assessment and Plan:   # Severe sepsis secondary to E. coli gastroenteritis GI pathogen positive for E. Coli CT scan negative for any acute findings Patient meets severe sepsis criteria, fever, tachycardia, hypotension, E. coli positive. S/p ceftriaxone and Levaquin given in the ED S/p Levaquin as per renal functions, discharged on Levaquin 500 mg p.o. daily for 3 days S/p IV fluid for hydration, continue oral hydration. S/p midodrine for pressure support, BP is stable now.  Patient was given PPI for GI prophylaxis and probiotics.  Diarrhea almost resolved. Follow with PCP in 1 week.   # Hypokalemia, potassium repleted.  Resolved # Hypophosphatemia due to poor nutrition.  Phos repleted. # Metabolic acidosis, bicarb repleted. Resolved  # Hypomagnesemia, mag repleted. Resolved  # Pyuria, Less likely UTI, Urine negative for LE and nitrite, bacteria many. Continue above antibiotics # AKI, Resolved. Baseline creatinine 1.03, EGFR >60 1/15 Cr 1.51 elevated most likely prerenal due to dehydration. S/p IVF.  Continue oral hydration. # Hypertension, HLD: Presented with sepsis and hypotension, held antihypertensive medications.  Continue Lipitor.  BP improved, patient  received IV fluid and midodrine.  Resumed antiplatelet medications on discharge.  Patient was advised to monitor BP at home and follow with PCP. # IDDM T2: Held home medications during hospital stay, patient was given Semglee 20 units and NovoLog sliding scale.  Resumed home regimen on discharge, advised to monitor CBG at home and continue diabetic diet. #  Depression: Continue BuSpar, Celexa and Seroquel Home dose. Continued gabapentin and Lamictal home dose.  Follow with PCP and psych as an outpatient to decrease polypharmacy. # Hypothyroid, continue Synthroid # Hypotension, BP improved s/p midodrine and IVF   Body mass index is 35.18 kg/m.  Nutrition Interventions:  - Patient was instructed, not to drive, operate heavy machinery, perform activities at heights, swimming or participation in water activities or provide baby sitting services while on Pain, Sleep and Anxiety Medications; until her outpatient Physician has advised to do so again.  - Also recommended to not to take more than prescribed Pain, Sleep and Anxiety Medications.  Patient was ambulatory without any assistance. On the day of the discharge the patient's vitals were stable, and no other acute medical condition were reported by patient. the patient was felt safe to be discharge at Home.  Consultants: None Procedures: None  Discharge Exam: General: Appear in no distress, no Rash; Oral Mucosa Clear, moist. Cardiovascular: S1 and S2 Present, no Murmur, Respiratory: normal respiratory effort, Bilateral Air entry present and no Crackles, no wheezes Abdomen: Bowel Sound present, Soft and no tenderness, no hernia Extremities: no Pedal edema, no calf tenderness Neurology: alert and oriented to time, place, and person affect appropriate.  Filed Weights   09/18/23 1149  Weight: 79 kg   Vitals:   09/21/23 2359 09/22/23 0751  BP: 104/65 (!) 140/71  Pulse: 86 75  Resp: 20 16  Temp: 98.2 F (36.8 C) 99.3 F (37.4 C)  SpO2: 97% 91%    DISCHARGE MEDICATION: Allergies as of 09/22/2023       Reactions   Mirtazapine Anaphylaxis   closes throat   Morphine And Codeine Anaphylaxis   Sulfamethoxazole-trimethoprim Rash   Whelps all over   Amoxicillin Er Nausea And Vomiting   Codeine Itching, Rash   Hydrocodone-acetaminophen Rash   Keflex [cephalexin] Rash   Pt does not  remember having a reaction or rash, no airway involvement   Prasterone Nausea And Vomiting        Medication List     STOP taking these medications    ezetimibe 10 MG tablet Commonly known as: ZETIA   fenofibrate micronized 134 MG capsule Commonly known as: LOFIBRA       TAKE these medications    atorvastatin 80 MG tablet Commonly known as: LIPITOR Take 1 tablet (80 mg total) by mouth at bedtime.   busPIRone 30 MG tablet Commonly known as: BUSPAR Take 30 mg by mouth 2 (two) times daily.   cetirizine 10 MG tablet Commonly known as: ZYRTEC Take 1 tablet (10 mg total) by mouth daily. What changed: when to take this   citalopram 20 MG tablet Commonly known as: CELEXA Take 20 mg by mouth every morning.   fluticasone 50 MCG/ACT nasal spray Commonly known as: FLONASE Place 2 sprays into both nostrils daily. What changed:  when to take this reasons to take this   gabapentin 600 MG tablet Commonly known as: NEURONTIN Take 600 mg by mouth at bedtime.   Glyxambi 25-5 MG Tabs Generic drug: Empagliflozin-linaGLIPtin Take 1 tablet by mouth daily.   HumaLOG KwikPen 100 UNIT/ML KwikPen  Generic drug: insulin lispro Inject into the skin as needed (high blood sguar). USE ONLY FOR GLUCOSE OVER 300   HumuLIN R U-500 KwikPen 500 UNIT/ML KwikPen Generic drug: insulin regular human CONCENTRATED Inject 50 Units into the skin 3 (three) times daily with meals.   hydrOXYzine 50 MG tablet Commonly known as: ATARAX Take 50 mg by mouth 3 (three) times daily as needed for anxiety.   Jardiance 25 MG Tabs tablet Generic drug: empagliflozin Take 25 mg by mouth daily.   lamoTRIgine 200 MG tablet Commonly known as: LAMICTAL Take 200 mg by mouth 2 (two) times daily.   levofloxacin 500 MG tablet Commonly known as: LEVAQUIN Take 1 tablet (500 mg total) by mouth daily for 3 days. Start taking on: September 23, 2023   meclizine 25 MG tablet Commonly known as: ANTIVERT Take 25 mg  by mouth 3 (three) times daily as needed for dizziness.   metoprolol tartrate 25 MG tablet Commonly known as: LOPRESSOR Take 1 tablet (25 mg total) by mouth 2 (two) times daily.   Multi-Vitamin tablet Take 1 tablet by mouth daily.   nitroGLYCERIN 0.4 MG SL tablet Commonly known as: NITROSTAT Place 1 tablet (0.4 mg total) under the tongue every 5 (five) minutes as needed for chest pain.   ondansetron 4 MG disintegrating tablet Commonly known as: ZOFRAN-ODT Take 1 tablet (4 mg total) by mouth every 8 (eight) hours as needed.   OneTouch Delica Lancets 30G Misc by Does not apply route.   oxyCODONE-acetaminophen 5-325 MG tablet Commonly known as: Percocet Take 1-2 tablets by mouth every 8 (eight) hours as needed for severe pain (pain score 7-10) or moderate pain (pain score 4-6).   pantoprazole 40 MG tablet Commonly known as: PROTONIX Take 1 tablet (40 mg total) by mouth daily for 14 days.   QUEtiapine 300 MG tablet Commonly known as: SEROQUEL Take 300 mg by mouth at bedtime.   saccharomyces boulardii 250 MG capsule Commonly known as: FLORASTOR Take 1 capsule (250 mg total) by mouth 2 (two) times daily for 7 days.   Synthroid 88 MCG tablet Generic drug: levothyroxine Take 1 tablet (88 mcg total) by mouth daily before breakfast.       Allergies  Allergen Reactions   Mirtazapine Anaphylaxis    closes throat   Morphine And Codeine Anaphylaxis   Sulfamethoxazole-Trimethoprim Rash    Whelps all over   Amoxicillin Er Nausea And Vomiting   Codeine Itching and Rash   Hydrocodone-Acetaminophen Rash   Keflex [Cephalexin] Rash    Pt does not remember having a reaction or rash, no airway involvement   Prasterone Nausea And Vomiting   Discharge Instructions     Call MD for:  difficulty breathing, headache or visual disturbances   Complete by: As directed    Call MD for:  extreme fatigue   Complete by: As directed    Call MD for:  persistant dizziness or light-headedness    Complete by: As directed    Call MD for:  persistant nausea and vomiting   Complete by: As directed    Call MD for:  severe uncontrolled pain   Complete by: As directed    Call MD for:  temperature >100.4   Complete by: As directed    Diet - low sodium heart healthy   Complete by: As directed    Discharge instructions   Complete by: As directed    Follow-up with PCP in 1 week   Increase activity slowly   Complete by:  As directed        The results of significant diagnostics from this hospitalization (including imaging, microbiology, ancillary and laboratory) are listed below for reference.    Significant Diagnostic Studies: CT ABDOMEN PELVIS W CONTRAST Result Date: 09/18/2023 CLINICAL DATA:  Abdominal pain. EXAM: CT ABDOMEN AND PELVIS WITH CONTRAST TECHNIQUE: Multidetector CT imaging of the abdomen and pelvis was performed using the standard protocol following bolus administration of intravenous contrast. RADIATION DOSE REDUCTION: This exam was performed according to the departmental dose-optimization program which includes automated exposure control, adjustment of the mA and/or kV according to patient size and/or use of iterative reconstruction technique. CONTRAST:  OMNIPAQUE IOHEXOL 300 MG/ML  SOLN COMPARISON:  CT abdomen pelvis dated 02/05/2023. FINDINGS: Lower chest: The visualized lung bases are clear. No intra-abdominal free air or free fluid. Hepatobiliary: The liver is unremarkable. No biliary dilatation. Cholecystectomy. No retained calcified stone noted in the central CBD. Pancreas: Unremarkable. No pancreatic ductal dilatation or surrounding inflammatory changes. Spleen: Normal in size without focal abnormality. Adrenals/Urinary Tract: The adrenal glands are unremarkable. Mild fullness of the right renal collecting system similar to prior CT. A transition noted at the ureteropelvic junction. No obstructing stone. Underlying stricture is not excluded. The left kidney is  unremarkable. The visualized ureters and urinary bladder appear unremarkable. Stomach/Bowel: There is loose stool in the proximal colon. Formed stool noted in the distal colon. There is no bowel obstruction or active inflammation. No CT evidence of acute appendicitis. Vascular/Lymphatic: Mild aortoiliac atherosclerotic disease. The IVC is unremarkable. No portal venous gas. There is no adenopathy. Reproductive: Hysterectomy.  No suspicious adnexal masses. Other: None Musculoskeletal: Mild degenerative changes. Sacral stimulator with battery pack in the subcutaneous soft tissues of the right buttock. No acute osseous pathology. IMPRESSION: 1. No acute intra-abdominal or pelvic pathology. 2.  Aortic Atherosclerosis (ICD10-I70.0). Electronically Signed   By: Elgie Collard M.D.   On: 09/18/2023 16:29   CT Head Wo Contrast Result Date: 09/18/2023 CLINICAL DATA:  Worsening headache, dizziness EXAM: CT HEAD WITHOUT CONTRAST TECHNIQUE: Contiguous axial images were obtained from the base of the skull through the vertex without intravenous contrast. RADIATION DOSE REDUCTION: This exam was performed according to the departmental dose-optimization program which includes automated exposure control, adjustment of the mA and/or kV according to patient size and/or use of iterative reconstruction technique. COMPARISON:  04/17/2023 FINDINGS: Brain: No evidence of acute infarction, hemorrhage, mass, mass effect, or midline shift. No hydrocephalus or extra-axial fluid collection. Basal ganglia calcifications. Vascular: No hyperdense vessel. Skull: Negative for fracture or focal lesion. Sinuses/Orbits: No acute finding. Other: The mastoid air cells are well aerated. IMPRESSION: No acute intracranial process. Electronically Signed   By: Wiliam Ke M.D.   On: 09/18/2023 15:51    Microbiology: Recent Results (from the past 240 hours)  Resp panel by RT-PCR (RSV, Flu A&B, Covid) Anterior Nasal Swab     Status: None    Collection Time: 09/18/23 11:43 AM   Specimen: Anterior Nasal Swab  Result Value Ref Range Status   SARS Coronavirus 2 by RT PCR NEGATIVE NEGATIVE Final    Comment: (NOTE) SARS-CoV-2 target nucleic acids are NOT DETECTED.  The SARS-CoV-2 RNA is generally detectable in upper respiratory specimens during the acute phase of infection. The lowest concentration of SARS-CoV-2 viral copies this assay can detect is 138 copies/mL. A negative result does not preclude SARS-Cov-2 infection and should not be used as the sole basis for treatment or other patient management decisions. A negative result may  occur with  improper specimen collection/handling, submission of specimen other than nasopharyngeal swab, presence of viral mutation(s) within the areas targeted by this assay, and inadequate number of viral copies(<138 copies/mL). A negative result must be combined with clinical observations, patient history, and epidemiological information. The expected result is Negative.  Fact Sheet for Patients:  BloggerCourse.com  Fact Sheet for Healthcare Providers:  SeriousBroker.it  This test is no t yet approved or cleared by the [place] FDA and  has been authorized for detection and/or diagnosis of SARS-CoV-2 by FDA under an Emergency Use Authorization (EUA). This EUA will remain  in effect (meaning this test can be used) for the duration of the COVID-19 declaration under Section 564(b)(1) of the Act, 21 U.S.C.section 360bbb-3(b)(1), unless the authorization is terminated  or revoked sooner.       Influenza A by PCR NEGATIVE NEGATIVE Final   Influenza B by PCR NEGATIVE NEGATIVE Final    Comment: (NOTE) The Xpert Xpress SARS-CoV-2/FLU/RSV plus assay is intended as an aid in the diagnosis of influenza from Nasopharyngeal swab specimens and should not be used as a sole basis for treatment. Nasal washings and aspirates are unacceptable for  Xpert Xpress SARS-CoV-2/FLU/RSV testing.  Fact Sheet for Patients: BloggerCourse.com  Fact Sheet for Healthcare Providers: SeriousBroker.it  This test is not yet approved or cleared by the [place] FDA and has been authorized for detection and/or diagnosis of SARS-CoV-2 by FDA under an Emergency Use Authorization (EUA). This EUA will remain in effect (meaning this test can be used) for the duration of the COVID-19 declaration under Section 564(b)(1) of the Act, 21 U.S.C. section 360bbb-3(b)(1), unless the authorization is terminated or revoked.     Resp Syncytial Virus by PCR NEGATIVE NEGATIVE Final    Comment: (NOTE) Fact Sheet for Patients: BloggerCourse.com  Fact Sheet for Healthcare Providers: SeriousBroker.it  This test is not yet approved or cleared by the [place] FDA and has been authorized for detection and/or diagnosis of SARS-CoV-2 by FDA under an Emergency Use Authorization (EUA). This EUA will remain in effect (meaning this test can be used) for the duration of the COVID-19 declaration under Section 564(b)(1) of the Act, 21 U.S.C. section 360bbb-3(b)(1), unless the authorization is terminated or revoked.  Performed at [facility], [address] [place], [place] [address]   C Difficile Quick Screen w PCR reflex     Status: None   Collection Time: 09/18/23  9:53 PM   Specimen: STOOL  Result Value Ref Range Status   C Diff antigen NEGATIVE NEGATIVE Final   C Diff toxin NEGATIVE NEGATIVE Final   C Diff interpretation No C. difficile detected.  Final    Comment: Performed at [facility], [address] [place], [place] [address]  Gastrointestinal Panel by PCR , Stool     Status: Abnormal   Collection Time: 09/18/23  9:53 PM   Specimen: STOOL  Result Value Ref Range Status   Campylobacter species NOT DETECTED NOT DETECTED Final    Plesimonas shigelloides NOT DETECTED NOT DETECTED Final   Salmonella species NOT DETECTED NOT DETECTED Final   Yersinia enterocolitica NOT DETECTED NOT DETECTED Final   Vibrio species NOT DETECTED NOT DETECTED Final   Vibrio cholerae NOT DETECTED NOT DETECTED Final   Enteroaggregative E coli (EAEC) NOT DETECTED NOT DETECTED Final   Enteropathogenic E coli (EPEC) DETECTED (A) NOT DETECTED Final    Comment: RESULT CALLED TO, READ BACK BY AND VERIFIED WITH: Ihor Dow RN @0015  09/19/23  ASW    Enterotoxigenic E coli (ETEC) NOT DETECTED NOT DETECTED Final   Shiga like toxin producing E coli (STEC) NOT DETECTED NOT DETECTED Final   Shigella/Enteroinvasive E coli (EIEC) NOT DETECTED NOT DETECTED Final   Cryptosporidium NOT DETECTED NOT DETECTED Final   Cyclospora cayetanensis NOT DETECTED NOT DETECTED Final   Entamoeba histolytica NOT DETECTED NOT DETECTED Final   Giardia lamblia NOT DETECTED NOT DETECTED Final   Adenovirus F40/41 NOT DETECTED NOT DETECTED Final   Astrovirus NOT DETECTED NOT DETECTED Final   Norovirus GI/GII DETECTED (A) NOT DETECTED Final    Comment: RESULT CALLED TO, READ BACK BY AND VERIFIED WITH: TIFFANY LISENMEYER RN @0015  09/19/23 ASW    Rotavirus A NOT DETECTED NOT DETECTED Final   Sapovirus (I, II, IV, and V) NOT DETECTED NOT DETECTED Final    Comment: Performed at [facility], [address] [place], [place] [address]  Culture, blood (Routine X 2) w Reflex to ID Panel     Status: None (Preliminary result)   Collection Time: 09/19/23  1:50 AM   Specimen: BLOOD  Result Value Ref Range Status   Specimen Description BLOOD BLOOD RIGHT ARM  Final   Special Requests   Final    BOTTLES DRAWN AEROBIC AND ANAEROBIC Blood Culture adequate volume   Culture   Final    NO GROWTH 3 DAYS Performed at [facility], [address] [place], [place] [address]    Report Status PENDING  Incomplete  Culture, blood (Routine X 2) w Reflex to ID Panel      Status: None (Preliminary result)   Collection Time: 09/19/23  2:10 AM   Specimen: BLOOD RIGHT ARM  Result Value Ref Range Status   Specimen Description BLOOD RIGHT ARM  Final   Special Requests BOTTLES DRAWN AEROBIC AND ANAEROBIC BCLV  Final   Culture   Final    NO GROWTH 3 DAYS Performed at [facility], [address] [place], [place] [address]    Report Status PENDING  Incomplete     Labs: CBC: Recent Labs  Lab 09/18/23 1144 09/19/23 0150 09/20/23 0320 09/21/23 0522 09/22/23 0549  WBC 5.6 4.8 4.8 4.7 4.4  NEUTROABS  --  3.9  --   --   --   HGB 13.9 12.1 11.0* 10.3* 10.6*  HCT 42.7 36.6 35.8* 32.1* 33.4*  MCV 89.0 89.7 94.5 90.2 91.5  PLT 243 196 139* 166 167   Basic Metabolic Panel: Recent Labs  Lab 09/18/23 1144 09/19/23 0150 09/20/23 0320 09/21/23 0522 09/22/23 0549  NA 139 135 143 140 140  K 3.5 3.3* 2.8* 4.3 4.2  CL 102 100 107 110 108  CO2 24 19* 24 22 28   GLUCOSE 144* 230* 84 119* 90  BUN 52* 54* 24* 12 11  CREATININE 1.28* 1.51* 0.90 0.82 0.90  CALCIUM 7.9* 6.8* 7.3* 7.3* 8.0*  MG  --  1.6* 2.4 2.1 1.8  PHOS  --  4.2 1.7* 1.5* 2.3*   Liver Function Tests: Recent Labs  Lab 09/18/23 1144  AST 61*  ALT 43  ALKPHOS 37*  BILITOT 0.4  PROT 5.7*  ALBUMIN 3.1*   Recent Labs  Lab 09/18/23 1144  LIPASE 51   No results for input(s): "AMMONIA" in the last 168 hours. Cardiac Enzymes: No results for input(s): "CKTOTAL", "CKMB", "CKMBINDEX", "TROPONINI" in the last 168 hours. BNP (last 3 results) No results for input(s): "BNP" in the last 8760 hours. CBG: Recent Labs  Lab 09/21/23  7846 09/21/23 1146 09/21/23 1623 09/22/23 0805 09/22/23 1134  GLUCAP 121* 144* 156* 90 125*    Time spent: 35 minutes  Signed:  Gillis Santa  [facility] 09/22/2023 11:36 AM

## 2023-09-24 ENCOUNTER — Telehealth: Payer: Self-pay

## 2023-09-24 LAB — CULTURE, BLOOD (ROUTINE X 2)
Culture: NO GROWTH
Culture: NO GROWTH
Special Requests: ADEQUATE

## 2023-09-24 NOTE — Transitions of Care (Post Inpatient/ED Visit) (Signed)
09/24/2023  Name: [name] MRN: [id number] DOB: 1966-03-27  Today's TOC FU Call Status: Today's TOC FU Call Status:: Successful TOC FU Call Completed TOC FU Call Complete Date: 09/24/23 Patient's Name and Date of Birth confirmed.  Transition Care Management Follow-up Telephone Call Date of Discharge: 09/22/23 Discharge Facility: [facility]) Type of Discharge: Inpatient Admission Primary Inpatient Discharge Diagnosis:: Severe sepsis secondary to E. coli gastroenteritis  GI pathogen positive for E. Coli How have you been since you were released from the hospital?: Better Any questions or concerns?: No  Items Reviewed: Did you receive and understand the discharge instructions provided?: Yes Medications obtained,verified, and reconciled?: Yes (Medications Reviewed) Any new allergies since your discharge?: No Dietary orders reviewed?: Yes Type of Diet Ordered:: Reg Heart Healthy NAS Do you have support at home?: Yes People in Home: spouse Name of Support/Comfort Primary Source: Spouse Tim  Medications Reviewed Today: Medications Reviewed Today     Reviewed by Johnnette Barrios, RN (Registered Nurse) on 09/24/23 at 1413  Med List Status: <None>   Medication Order Taking? Sig Documenting Provider Last Dose Status Informant  atorvastatin (LIPITOR) 80 MG tablet [id number] Yes Take 1 tablet (80 mg total) by mouth at bedtime. Alba Cory, MD Taking Active Self  busPIRone (BUSPAR) 30 MG tablet [id number] Yes Take 30 mg by mouth 2 (two) times daily. Nelwyn Salisbury, NP Taking Active Self  cetirizine (ZYRTEC) 10 MG tablet [id number] Yes Take 1 tablet (10 mg total) by mouth daily.  Patient taking differently: Take 10 mg by mouth at bedtime.   Cuthriell, Delorise Royals, PA-C Taking Active Self  citalopram (CELEXA) 20 MG tablet [id number] Yes Take 20 mg by mouth every morning. [facility] Taking Active Self  empagliflozin (JARDIANCE) 25 MG TABS tablet  [id number] Yes Take 25 mg by mouth daily. [provider] Taking Active Self  fluticasone (FLONASE) 50 MCG/ACT nasal spray [id number] Yes Place 2 sprays into both nostrils daily.  Patient taking differently: Place 2 sprays into both nostrils daily as needed for allergies.   Alba Cory, MD Taking Active Self  gabapentin (NEURONTIN) 600 MG tablet [id number] Yes Take 600 mg by mouth at bedtime. [provider] Taking Active Self  GLYXAMBI 25-5 MG TABS [id number] Yes Take 1 tablet by mouth daily. [provider] Taking Active Self  HUMALOG KWIKPEN 100 UNIT/ML KwikPen [id number] Yes Inject into the skin as needed (high blood sguar). USE ONLY FOR GLUCOSE OVER 300 [provider] Taking Active Self  HUMULIN R U-500 KWIKPEN 500 UNIT/ML Stephanie Coup [id number] Yes Inject 50 Units into the skin 3 (three) times daily with meals. [provider] Taking Active Self           Med Note (Caylei Sperry L   Mon Sep 24, 2023  2:10 PM) Verified 50 units   hydrOXYzine (ATARAX) 50 MG tablet [id number] Yes Take 50 mg by mouth 3 (three) times daily as needed for anxiety. [provider] Taking Active Self  lamoTRIgine (LAMICTAL) 200 MG tablet [id number] Yes Take 200 mg by mouth 2 (two) times daily.  Nelwyn Salisbury, NP Taking Active Self  levofloxacin (LEVAQUIN) 500 MG tablet [id number] Yes Take 1 tablet (500 mg total) by mouth daily for 3 days. Gillis Santa, MD Taking Active   meclizine (ANTIVERT) 25 MG tablet [id number] Yes Take 25 mg by mouth 3 (three) times daily as needed for dizziness. [provider] Taking Active Self  metoprolol tartrate (LOPRESSOR) 25 MG tablet [id number]  Yes Take 1 tablet (25 mg total) by mouth 2 (two) times daily. Charlsie Quest, NP Taking Active Self  Multiple Vitamin (MULTI-VITAMIN) tablet [id number] Yes Take 1 tablet by mouth daily. [provider] Taking Active Self  nitroGLYCERIN (NITROSTAT) 0.4 MG SL tablet [id number] Yes Place  1 tablet (0.4 mg total) under the tongue every 5 (five) minutes as needed for chest pain. Charlsie Quest, NP Taking Active Self  ondansetron (ZOFRAN-ODT) 4 MG disintegrating tablet [id number] Yes Take 1 tablet (4 mg total) by mouth every 8 (eight) hours as needed. Danelle Berry, PA-C Taking Active Self  OneTouch Delica Lancets 30G MISC [id number] Yes by Does not apply route. [provider] Taking Active Self  oxyCODONE-acetaminophen (PERCOCET) 5-325 MG tablet [id number] Yes Take 1-2 tablets by mouth every 8 (eight) hours as needed for severe pain (pain score 7-10) or moderate pain (pain score 4-6). Alfredo Martinez, MD Taking Active Self  pantoprazole (PROTONIX) 40 MG tablet [id number] Yes Take 1 tablet (40 mg total) by mouth daily for 14 days. Gillis Santa, MD Taking Active   QUEtiapine (SEROQUEL) 300 MG tablet [id number] Yes Take 300 mg by mouth at bedtime. [facility] Taking Active Self  saccharomyces boulardii (FLORASTOR) 250 MG capsule [id number] Yes Take 1 capsule (250 mg total) by mouth 2 (two) times daily for 7 days. Gillis Santa, MD Taking Active   SYNTHROID 88 MCG tablet [id number] Yes Take 1 tablet (88 mcg total) by mouth daily before breakfast. Alba Cory, MD Taking Active Self           Med Note Kyung Rudd, DENISE A   Mon Jul 09, 2023  8:39 AM)    Med List Note Sharia Reeve, CPhT 10/04/21 1720): Patient receives 30 day supply in bubble packaging from [employer] (per husband)          Medication reconciliation / review completed based on most recent discharge summary and EHR medication list. Confirmed patient is  now taking all newly prescribed medications as instructed (any discrepancies are noted in review section)  They were unable to pick up prescriptions yesterday 1/19 Pharmacy was closed. She missed 1 dose ABT  Patient / Caregiver is aware of any changes to and / or  any dosage adjustments to medication regimen. Patient/ Caregiver denies  questions at this time and reports no barriers to medication adherence .   Home Care and Equipment/Supplies: Were Home Health Services Ordered?: No Any new equipment or medical supplies ordered?: No  Functional Questionnaire: Do you need assistance with bathing/showering or dressing?: No Do you need assistance with meal preparation?: No Do you need assistance with eating?: No Do you have difficulty maintaining continence: No Do you need assistance with getting out of bed/getting out of a chair/moving?: No Do you have difficulty managing or taking your medications?: No  Follow up appointments reviewed: PCP Follow-up appointment confirmed?: No (She will call and schedule) MD Provider Line [phone] Given: No Specialist Hospital Follow-up appointment confirmed?: Yes  SDOH Interventions Today    Flowsheet Row Most Recent Value  SDOH Interventions   Food Insecurity Interventions Intervention Not Indicated  Housing Interventions Intervention Not Indicated  Transportation Interventions Intervention Not Indicated, Patient Resources (Friends/Family)  Utilities Interventions Intervention Not Indicated  Social Connections Interventions Intervention Not Indicated     Patient is at high risk for readmission and/or has history of  high utilization  Discussed VBCI  TOC program and weekly calls to patient to assess condition/status, medication management  and provide support/education as  indicated . Patient/ Caregiver voiced understanding and is  agreeable to 30 day program   Spoke with both patient and spouse . She had a negative experience in the ER ( 2 days Observation before transferred to floor) She reports 1 Travel Nurse was rude and made her feel guilty for have a diarrhea episode and needing assistance with cleaning herself up. Another Nurse inserted an IV , she told this nurse the IV did not feel right and she thought it was dislodged , the Nurse administered medications , the IV  infiltrated and she required several additional medications and a new IV  start. She believes both were female Travel Nurses.   Routine follow-up and on-going assessment evaluation and education of disease processes, recommended interventions for both chronic and acute medical conditions , will occur during each weekly visit along with ongoing review of symptoms ,medication reviews and reconciliation. Any updates , inconsistencies, discrepancies or acute care concerns will be addressed and routed to the correct Practitioner if indicated   Based on current information and Insurance plan -Reviewed benefits available to patient, including details about eligibility options for care if any area of needs were identified.  Reviewed patients ability to access and / or navigating the benefits system..Amb Referral made if indicted , refer to orders section of note for details   Please refer to Care Plan for goals and interventions -Effectiveness of interventions, symptom management and outcomes will be evaluated  weekly during [facility] 30-day Program Outreach calls  . Any necessary  changes and updates to Care Plan will be completed episodically    Reviewed goals for care Patient verbalizes understanding of instructions and care plan provided. Patient was encouraged to make informed decisions about their care, actively participate in managing their health condition, and implement lifestyle changes as needed to promote independence and self-management of health care    The patient has been provided with contact information for the care management team and has been advised to call with any health-related questions or concerns.    Susa Loffler , BSN, RN [facility]   [facility] RN Care Manager Direct Dial [phone] Fax [phone] Website: [email or web address]

## 2023-09-24 NOTE — Patient Instructions (Signed)
Visit Information  Thank you for taking time to visit with me today. Please don't hesitate to contact me if I can be of assistance to you before our next scheduled telephone appointment.  Our next appointment is by telephone on 10/02/23 at 10:00am  Following is a copy of your care plan:   Goals Addressed             This Visit's Progress    TOC Care Pan       Current Barriers:  Medication management new medications,  Provider appointments PCP and   RNCM Clinical Goal(s):  Patient will work with the Care Management team over the next 30 days to address Transition of Care Barriers: Medication Management Provider appointments take all medications exactly as prescribed and will call provider for medication related questions as evidenced by no missed medication doses attend all scheduled medical appointments: with PCP and Specialist as evidenced by no missed follow-up appointments   through collaboration with RN Care manager, provider, and care team.   Interventions: Evaluation of current treatment plan related to  self management and patient's adherence to plan as established by provider  Transitions of Care:  New goal. Doctor Visits  - discussed the importance of doctor visits Post discharge activity limitations prescribed by provider reviewed Reviewed Signs and symptoms of infection  Patient Goals/Self-Care Activities: Participate in Transition of Care Program/Attend TOC scheduled calls Take all medications as prescribed Attend all scheduled provider appointments Call pharmacy for medication refills 3-7 days in advance of running out of medications Attend church or other social activities Perform all self care activities independently  Perform IADL's (shopping, preparing meals, housekeeping, managing finances) independently Call provider office for new concerns or questions   Follow Up Plan:  Telephone follow up appointment with care management team member scheduled for:   10/02/23 @ 10:00 am  The patient has been provided with contact information for the care management team and has been advised to call with any health related questions or concerns.           Reviewed goals for care Patient/ Caregiver verbalizes understanding of instructions with the plan of care . The  Patient / Caregiver was encouraged to make informed decisions about care, actively participate in managing health conditions, and implement lifestyle changes as needed to promote independence and self-management of healthcare. SDOH screenings have been completed and addressed if indicted There are no reported barriers to care.    Follow-up Plan VBCI Case Management Nurse will provide follow-up and on-going assessment ,evaluation and education of disease processes, recommended interventions for both chronic and acute medical conditions ,  along with ongoing review of symptoms ,medication reviews / reconciliation during each weekly call . Any updates , inconsistencies, discrepancies or acute care concerns will be addressed and routed to the correct Practitioner if indicated   [facility]  Please call the care guide team at [phone]  if you need to cancel or reschedule your appointment . For scheduled calls -Three attempts will be made to reach you -if the scheduled call is missed or  we are unable to reach the you after 3 attempts no additional outreach attempts will be made and the TOC follow-up will be closed .   If you need to speak to a Nurse you may  call me directly at the number below or if I am unavailable,and  your need is urgent  please call the main VBCI number at [phone] and ask to speak with one  of the [facility] ( Transition of Care )  Nurses  .                                                                               Additionally, If you experience worsening of your symptoms, develop shortness of breath, If you are experiencing a medical emergency,  develop  suicidal or homicidal thoughts you must seek medical attention immediately by calling 911 or report to your local emergency department or urgent care.   If you have a non-emergency medical problem during routine business hours, please contact your provider's office and ask to speak with a nurse.       Please take the time to read instructions/literature along with the possible adverse reactions/side effects for all the Medicines that have been prescribed to you. Only take newly prescribed  Medications after you have completely understood and accept all the possible adverse reactions/side effects.   Do not take more than prescribed Medications for  Pain, Sleep and Anxiety. Do not drive when taking Pain medications or sleep aid/ insomnia  medications It is not advisable to combine anxiety, sleep and pain medications without talking with your primary care practitioner    If you are experiencing a Mental Health or Behavioral Health Crisis or need someone to talk to Please call the Suicide and Crisis Lifeline: 988 You may also call the [place] National Suicide Prevention Lifeline: (956)087-9288 or TTY: 931-223-6661 TTY 510-158-4865) to talk to a trained counselor.  You may call the Behavioral Health Crisis Line at 5171093716, at any time, 24 hours a day, 7 days a week- however If you are in danger or need immediate medical attention, call 911.   If you would like help to quit smoking, call 1-800-QUIT-NOW ( 814 151 1113) OR Espaol: 1-855-Djelo-Ya (3-474-259-5638) o para ms informacin haga clic aqu or Text READY to 756-433 to register via text.   Susa Loffler , BSN, RN [facility]   [facility] RN Care Manager Direct Dial [phone] Fax [phone] Website: [email or web address]

## 2023-10-01 ENCOUNTER — Encounter: Payer: Self-pay | Admitting: Family Medicine

## 2023-10-01 ENCOUNTER — Ambulatory Visit (INDEPENDENT_AMBULATORY_CARE_PROVIDER_SITE_OTHER): Payer: Medicare Other | Admitting: Family Medicine

## 2023-10-01 ENCOUNTER — Other Ambulatory Visit: Payer: Self-pay | Admitting: Family Medicine

## 2023-10-01 VITALS — BP 114/70 | HR 75 | Temp 97.5°F | Resp 16 | Ht 59.0 in | Wt 171.3 lb

## 2023-10-01 DIAGNOSIS — N183 Chronic kidney disease, stage 3 unspecified: Secondary | ICD-10-CM

## 2023-10-01 DIAGNOSIS — N189 Chronic kidney disease, unspecified: Secondary | ICD-10-CM | POA: Diagnosis not present

## 2023-10-01 DIAGNOSIS — Z8619 Personal history of other infectious and parasitic diseases: Secondary | ICD-10-CM

## 2023-10-01 DIAGNOSIS — Z09 Encounter for follow-up examination after completed treatment for conditions other than malignant neoplasm: Secondary | ICD-10-CM

## 2023-10-01 DIAGNOSIS — A419 Sepsis, unspecified organism: Secondary | ICD-10-CM | POA: Diagnosis not present

## 2023-10-01 DIAGNOSIS — E039 Hypothyroidism, unspecified: Secondary | ICD-10-CM | POA: Diagnosis not present

## 2023-10-01 DIAGNOSIS — D696 Thrombocytopenia, unspecified: Secondary | ICD-10-CM

## 2023-10-01 DIAGNOSIS — Z9181 History of falling: Secondary | ICD-10-CM

## 2023-10-01 DIAGNOSIS — E1169 Type 2 diabetes mellitus with other specified complication: Secondary | ICD-10-CM

## 2023-10-01 DIAGNOSIS — Z794 Long term (current) use of insulin: Secondary | ICD-10-CM

## 2023-10-01 DIAGNOSIS — K529 Noninfective gastroenteritis and colitis, unspecified: Secondary | ICD-10-CM | POA: Diagnosis not present

## 2023-10-01 DIAGNOSIS — R300 Dysuria: Secondary | ICD-10-CM

## 2023-10-01 DIAGNOSIS — I700 Atherosclerosis of aorta: Secondary | ICD-10-CM

## 2023-10-01 DIAGNOSIS — E119 Type 2 diabetes mellitus without complications: Secondary | ICD-10-CM

## 2023-10-01 DIAGNOSIS — N179 Acute kidney failure, unspecified: Secondary | ICD-10-CM | POA: Diagnosis not present

## 2023-10-01 MED ORDER — EZETIMIBE 10 MG PO TABS: 10.0000 mg | ORAL_TABLET | Freq: Every day | ORAL | 0 refills | Status: DC

## 2023-10-01 NOTE — Progress Notes (Signed)
Name: [name]   MRN: [id number]    DOB: Jan 13, 1966   Date:10/01/2023       Progress Note  Subjective  Chief Complaint  Chief Complaint  Patient presents with   Hospitalization Follow-up    Sepsis, pt feeling better   HPI   Hospital discharge follow up  Admission date:09/18/2023 Discharge date: 09/22/2023  Transitions of care FU call : 09/24/2023  Discharge diagnosis: E.coli Sepsis ( principal problem)   Patient went to [facility] by EMS  due to nausea, vomiting and diarrhea and weakness the day she went to [facility] Upon arrival to [facility] she has hypotensive, febrile and tachycardic. Her GFR was slightly lower at 48, acute on CKI. No acute findings on CT abdomen, white count remained normal, mild anemia, phosphorus low. She was treated with Zofran, one dose of Levaquin at [facility] , Rocephin and Tylenol , she also had to take midodrine during hospital stay . Since discharge she is feeling well   Medication reconciliation was done during the visit   Discharge medication: Levaquin 500 mg to take daily for 3 days that she finished  She states that the day after she went home she was using her walke, she felt dizzy/lightheaded and feel over her walker , hit her face on the floor, she called for help right away, but she did not call 911 because she did not want to go back to the [facility]  She is almost out of synthroid 88 mcg daily and we will recheck TSH  She was advised to stop  Trulicity, Fenofibrate and zetia , we will resume Zetia and trulicity today but stay off fenofibrate and Trulicity  for now .  Patient Active Problem List   Diagnosis Date Noted   Sepsis due to Escherichia coli (E. coli) (HCC) 09/19/2023   Pyuria 09/18/2023   Hypoglycemia due to insulin 08/06/2023   Acquired hypothyroidism 06/20/2023   Class 2 severe obesity with serious comorbidity and body mass index (BMI) of 35.0 to 35.9 in adult [facility]) 06/19/2023   Type 2 diabetes mellitus with hyperglycemia, with long-term current use of  insulin (HCC) 06/18/2023   Insulin dependent type 2 diabetes mellitus (HCC) 03/19/2023   Encephalopathy acute 09/13/2022   Vertigo 02/10/2022   TIA (transient ischemic attack) 10/04/2021   HTN (hypertension) 10/04/2021   Chronic kidney disease, stage 3a (HCC) 10/04/2021   Depression with anxiety 10/04/2021   COPD (chronic obstructive pulmonary disease) (HCC) 10/04/2021   CAD (coronary artery disease) 10/04/2021   Chronic diastolic CHF (congestive heart failure) (HCC) 10/04/2021   Type II diabetes mellitus with renal manifestations (HCC) 06/30/2021   Atherosclerosis of aorta (HCC) 03/28/2021   Iron deficiency anemia due to chronic blood loss    Polyp of ascending colon    Vitamin B12 deficiency 08/08/2018   Patient is full code 10/30/2017   Coronary artery disease of native artery of native heart with stable angina pectoris (HCC) 10/09/2017   Plantar fascial fibromatosis 10/09/2017   Tendinitis of wrist 10/09/2017   Schizoaffective disorder, bipolar type (HCC) 09/28/2017   Adenomatous colon polyp 10/15/2015   Bipolar affective disorder (HCC) 10/15/2015   Constipation 10/15/2015   HLD (hyperlipidemia) 10/15/2015   NASH (nonalcoholic steatohepatitis) 10/15/2015   Obesity (BMI 35.0-39.9 without comorbidity) 10/15/2015   Low back pain with left-sided sciatica 08/18/2015   Apnea, sleep 05/05/2015   Heart valve disease 05/05/2015   Complicated UTI (urinary tract infection) 03/24/2015   CKD stage 3 due to type 2 diabetes mellitus (HCC) 03/15/2015  Migraine 03/12/2015   Chronic bronchitis (HCC) 01/20/2015   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HCC) 01/20/2015   GERD (gastroesophageal reflux disease) 01/20/2015   Hypothyroid    Vitamin D deficiency    Urinary incontinence    Liver lesion    Hypomagnesemia    Obstructive sleep apnea 10/20/2013   Hereditary and idiopathic peripheral neuropathy 03/04/2009   Hemorrhoids, external 09/03/2008   Allergic rhinitis 07/04/2007   Interstitial  cystitis 07/04/2007    Past Surgical History:  Procedure Laterality Date   ABDOMINAL HYSTERECTOMY     Partial   bladder stim  2007   BLADDER SURGERY     x 2. tacking of bladder and the other was the stimulator placement   CHOLECYSTECTOMY     COLONOSCOPY WITH PROPOFOL N/A 03/04/2019   Procedure: COLONOSCOPY WITH PROPOFOL;  Surgeon: Midge Minium, MD;  Location: [facility] ENDOSCOPY;  Service: Endoscopy;  Laterality: N/A;   ESOPHAGOGASTRODUODENOSCOPY (EGD) WITH PROPOFOL N/A 03/04/2019   Procedure: ESOPHAGOGASTRODUODENOSCOPY (EGD) WITH PROPOFOL;  Surgeon: Midge Minium, MD;  Location: [facility] ENDOSCOPY;  Service: Endoscopy;  Laterality: N/A;   INCISION AND DRAINAGE ABSCESS Right 04/16/2019   Procedure: INCISION AND DRAINAGE ABSCESS;  Surgeon: Sung Amabile, DO;  Location: [facility] ORS;  Service: General;  Laterality: Right;   INTERSTIM IMPLANT PLACEMENT N/A 07/16/2023   Procedure: Leane Platt IMPLANT FIRST STAGE WITH IMPEDANCE CHECK;  Surgeon: Alfredo Martinez, MD;  Location: [facility] ORS;  Service: Urology;  Laterality: N/A;   INTERSTIM IMPLANT PLACEMENT N/A 07/16/2023   Procedure: Leane Platt IMPLANT SECOND STAGE;  Surgeon: Alfredo Martinez, MD;  Location: [facility] ORS;  Service: Urology;  Laterality: N/A;   INTERSTIM IMPLANT REMOVAL N/A 07/16/2023   Procedure: REMOVAL OF INTERSTIM IMPLANT;  Surgeon: Alfredo Martinez, MD;  Location: [facility] ORS;  Service: Urology;  Laterality: N/A;   INTERSTIM-GENERATOR CHANGE N/A 12/31/2017   Procedure: INTERSTIM-GENERATOR CHANGE;  Surgeon: Alfredo Martinez, MD;  Location: [facility] ORS;  Service: Urology;  Laterality: N/A;   LEFT HEART CATH AND CORONARY ANGIOGRAPHY Left 04/13/2011   Procedure: LEFT HEART CATH AND CORONARY ANGIOGRAPHY; Location: [facility]; Surgeon: Arnoldo Hooker, MD   LIVER BIOPSY  11/2014   Negative   LUNG BIOPSY  2015   small spot on lung   TUBAL LIGATION     UPPER GI ENDOSCOPY     WRIST SURGERY Right 11/02/2019    Family History  Problem Relation Age of Onset    Diabetes Mother    Heart disease Mother    Hyperlipidemia Mother    Hypertension Mother    Kidney disease Mother    Mental illness Mother    Hypothyroidism Mother    Stroke Mother    Osteoporosis Mother    Glaucoma Mother    Congestive Heart Failure Mother    Hypertension Father    Kidney disease Brother    Intracerebral hemorrhage Brother    Breast cancer Maternal Grandmother 60   Heart disease Maternal Grandfather    Rheum arthritis Maternal Grandfather    Cancer Maternal Grandfather        liver   Kidney disease Maternal Grandfather    Cancer Paternal Grandmother        lung   Asthma Daughter    Cancer Daughter        throat   Bipolar disorder Daughter    Bipolar disorder Daughter    Hypertension Daughter    Restless legs syndrome Daughter     Social History   Tobacco Use   Smoking status: Never   Smokeless tobacco: Never  Substance Use Topics   Alcohol use: No     Current Outpatient Medications:    atorvastatin (LIPITOR) 80 MG tablet, Take 1 tablet (80 mg total) by mouth at bedtime., Disp: 90 tablet, Rfl: 1   busPIRone (BUSPAR) 30 MG tablet, Take 30 mg by mouth 2 (two) times daily., Disp: , Rfl:    cetirizine (ZYRTEC) 10 MG tablet, Take 1 tablet (10 mg total) by mouth daily. (Patient taking differently: Take 10 mg by mouth at bedtime.), Disp: 30 tablet, Rfl: 0   citalopram (CELEXA) 20 MG tablet, Take 20 mg by mouth every morning., Disp: , Rfl:    empagliflozin (JARDIANCE) 25 MG TABS tablet, Take 25 mg by mouth daily., Disp: , Rfl:    fluticasone (FLONASE) 50 MCG/ACT nasal spray, Place 2 sprays into both nostrils daily. (Patient taking differently: Place 2 sprays into both nostrils daily as needed for allergies.), Disp: 48 g, Rfl: 0   gabapentin (NEURONTIN) 600 MG tablet, Take 600 mg by mouth at bedtime., Disp: , Rfl:    GLYXAMBI 25-5 MG TABS, Take 1 tablet by mouth daily., Disp: , Rfl:    HUMALOG KWIKPEN 100 UNIT/ML KwikPen, Inject into the skin as needed (high  blood sguar). USE ONLY FOR GLUCOSE OVER 300, Disp: , Rfl:    HUMULIN R U-500 KWIKPEN 500 UNIT/ML kwikpen, Inject 50 Units into the skin 3 (three) times daily with meals., Disp: , Rfl:    hydrOXYzine (ATARAX) 50 MG tablet, Take 50 mg by mouth 3 (three) times daily as needed for anxiety., Disp: , Rfl:    lamoTRIgine (LAMICTAL) 200 MG tablet, Take 200 mg by mouth 2 (two) times daily. , Disp: , Rfl:    meclizine (ANTIVERT) 25 MG tablet, Take 25 mg by mouth 3 (three) times daily as needed for dizziness., Disp: , Rfl:    metoprolol tartrate (LOPRESSOR) 25 MG tablet, Take 1 tablet (25 mg total) by mouth 2 (two) times daily., Disp: 90 tablet, Rfl: 3   Multiple Vitamin (MULTI-VITAMIN) tablet, Take 1 tablet by mouth daily., Disp: , Rfl:    nitroGLYCERIN (NITROSTAT) 0.4 MG SL tablet, Place 1 tablet (0.4 mg total) under the tongue every 5 (five) minutes as needed for chest pain., Disp: 25 tablet, Rfl: 3   ondansetron (ZOFRAN-ODT) 4 MG disintegrating tablet, Take 1 tablet (4 mg total) by mouth every 8 (eight) hours as needed., Disp: 20 tablet, Rfl: 0   OneTouch Delica Lancets 30G MISC, by Does not apply route., Disp: , Rfl:    pantoprazole (PROTONIX) 40 MG tablet, Take 1 tablet (40 mg total) by mouth daily for 14 days., Disp: 14 tablet, Rfl: 0   QUEtiapine (SEROQUEL) 300 MG tablet, Take 300 mg by mouth at bedtime., Disp: , Rfl:    SYNTHROID 88 MCG tablet, Take 1 tablet (88 mcg total) by mouth daily before breakfast., Disp: 90 tablet, Rfl: 0  Allergies  Allergen Reactions   Mirtazapine Anaphylaxis    closes throat   Morphine And Codeine Anaphylaxis   Sulfamethoxazole-Trimethoprim Rash    Whelps all over   Amoxicillin Er Nausea And Vomiting   Codeine Itching and Rash   Hydrocodone-Acetaminophen Rash   Keflex [Cephalexin] Rash    Pt does not remember having a reaction or rash, no airway involvement   Prasterone Nausea And Vomiting    I personally reviewed active problem list, medication list, allergies  with the patient/caregiver today.   ROS  Ten systems reviewed and is negative except as mentioned in HPI  Objective  Vitals:   10/01/23 0952  BP: 114/70  Pulse: 75  Resp: 16  Temp: (!) 97.5 F (36.4 C)  TempSrc: Oral  SpO2: 97%  Weight: 171 lb 4.8 oz (77.7 kg)  Height: 4\' 11"  (1.499 m)    Body mass index is 34.6 kg/m.  Physical Exam  Constitutional: Patient appears well-developed and well-nourished. Obese  No distress.  HEENT: head atraumatic, normocephalic, pupils equal and reactive to light, ears normal TM, neck supple Cardiovascular: Normal rate, regular rhythm and normal heart sounds.  No murmur heard. No BLE edema. Pulmonary/Chest: Effort normal and breath sounds normal. No respiratory distress. Skin: bruising on her face from recent fall, normal EOM, front teeth a little loose Abdominal: Soft.  There is no tenderness. Psychiatric: Patient has a normal mood and affect. behavior is normal. Judgment and thought content normal.   Recent Results (from the past 2160 hours)  Glucose, capillary     Status: Abnormal   Collection Time: 07/16/23 12:04 PM  Result Value Ref Range   Glucose-Capillary 254 (H) 70 - 99 mg/dL    Comment: Glucose reference range applies only to samples taken after fasting for at least 8 hours.  Glucose, capillary     Status: Abnormal   Collection Time: 07/16/23  2:57 PM  Result Value Ref Range   Glucose-Capillary 237 (H) 70 - 99 mg/dL    Comment: Glucose reference range applies only to samples taken after fasting for at least 8 hours.  Urinalysis, Complete     Status: Abnormal   Collection Time: 09/10/23 11:05 AM  Result Value Ref Range   Specific Gravity, UA 1.015 1.005 - 1.030   pH, UA 5.0 5.0 - 7.5   Color, UA Yellow Yellow   Appearance Ur Clear Clear   Leukocytes,UA Negative Negative   Protein,UA Negative Negative/Trace   Glucose, UA 3+ (A) Negative   Ketones, UA 1+ (A) Negative   RBC, UA Negative Negative   Bilirubin, UA Negative  Negative   Urobilinogen, Ur 0.2 0.2 - 1.0 mg/dL   Nitrite, UA Negative Negative   Microscopic Examination See below:   Microscopic Examination     Status: Abnormal   Collection Time: 09/10/23 11:05 AM   Urine  Result Value Ref Range   WBC, UA 6-10 (A) 0 - 5 /hpf   RBC, Urine 0-2 0 - 2 /hpf   Epithelial Cells (non renal) >10 (A) 0 - 10 /hpf   Bacteria, UA Few None seen/Few   Yeast, UA Present (A) None seen  Resp panel by RT-PCR (RSV, Flu A&B, Covid) Anterior Nasal Swab     Status: None   Collection Time: 09/18/23 11:43 AM   Specimen: Anterior Nasal Swab  Result Value Ref Range   SARS Coronavirus 2 by RT PCR NEGATIVE NEGATIVE    Comment: (NOTE) SARS-CoV-2 target nucleic acids are NOT DETECTED.  The SARS-CoV-2 RNA is generally detectable in upper respiratory specimens during the acute phase of infection. The lowest concentration of SARS-CoV-2 viral copies this assay can detect is 138 copies/mL. A negative result does not preclude SARS-Cov-2 infection and should not be used as the sole basis for treatment or other patient management decisions. A negative result may occur with  improper specimen collection/handling, submission of specimen other than nasopharyngeal swab, presence of viral mutation(s) within the areas targeted by this assay, and inadequate number of viral copies(<138 copies/mL). A negative result must be combined with clinical observations, patient history, and epidemiological information. The expected result is Negative.  Fact Sheet for Patients:  BloggerCourse.com  Fact Sheet for Healthcare Providers:  SeriousBroker.it  This test is no t yet approved or cleared by the [place] FDA and  has been authorized for detection and/or diagnosis of SARS-CoV-2 by FDA under an Emergency Use Authorization (EUA). This EUA will remain  in effect (meaning this test can be used) for the duration of the COVID-19 declaration  under Section 564(b)(1) of the Act, 21 U.S.C.section 360bbb-3(b)(1), unless the authorization is terminated  or revoked sooner.       Influenza A by PCR NEGATIVE NEGATIVE   Influenza B by PCR NEGATIVE NEGATIVE    Comment: (NOTE) The Xpert Xpress SARS-CoV-2/FLU/RSV plus assay is intended as an aid in the diagnosis of influenza from Nasopharyngeal swab specimens and should not be used as a sole basis for treatment. Nasal washings and aspirates are unacceptable for Xpert Xpress SARS-CoV-2/FLU/RSV testing.  Fact Sheet for Patients: BloggerCourse.com  Fact Sheet for Healthcare Providers: SeriousBroker.it  This test is not yet approved or cleared by the [place] FDA and has been authorized for detection and/or diagnosis of SARS-CoV-2 by FDA under an Emergency Use Authorization (EUA). This EUA will remain in effect (meaning this test can be used) for the duration of the COVID-19 declaration under Section 564(b)(1) of the Act, 21 U.S.C. section 360bbb-3(b)(1), unless the authorization is terminated or revoked.     Resp Syncytial Virus by PCR NEGATIVE NEGATIVE    Comment: (NOTE) Fact Sheet for Patients: BloggerCourse.com  Fact Sheet for Healthcare Providers: SeriousBroker.it  This test is not yet approved or cleared by the [place] FDA and has been authorized for detection and/or diagnosis of SARS-CoV-2 by FDA under an Emergency Use Authorization (EUA). This EUA will remain in effect (meaning this test can be used) for the duration of the COVID-19 declaration under Section 564(b)(1) of the Act, 21 U.S.C. section 360bbb-3(b)(1), unless the authorization is terminated or revoked.  Performed at [facility], [address] [place], [place] [address]   CBC     Status: None   Collection Time: 09/18/23 11:44 AM  Result Value Ref Range   WBC 5.6 4.0 - 10.5 K/uL    RBC 4.80 3.87 - 5.11 MIL/uL   Hemoglobin 13.9 12.0 - 15.0 g/dL   HCT 96.2 95.2 - 84.1 %   MCV 89.0 80.0 - 100.0 fL   MCH 29.0 26.0 - 34.0 pg   MCHC 32.6 30.0 - 36.0 g/dL   RDW 32.4 40.1 - 02.7 %   Platelets 243 150 - 400 K/uL   nRBC 0.0 0.0 - 0.2 %    Comment: Performed at [facility], [address] [place], [place] [address]  Comprehensive metabolic panel     Status: Abnormal   Collection Time: 09/18/23 11:44 AM  Result Value Ref Range   Sodium 139 135 - 145 mmol/L   Potassium 3.5 3.5 - 5.1 mmol/L   Chloride 102 98 - 111 mmol/L   CO2 24 22 - 32 mmol/L   Glucose, Bld 144 (H) 70 - 99 mg/dL    Comment: Glucose reference range applies only to samples taken after fasting for at least 8 hours.   BUN 52 (H) 6 - 20 mg/dL   Creatinine, Ser 4.40 (H) 0.44 - 1.00 mg/dL   Calcium 7.9 (L) 8.9 - 10.3 mg/dL   Total Protein 5.7 (L) 6.5 - 8.1 g/dL   Albumin 3.1 (L) 3.5 - 5.0 g/dL   AST 61 (H) 15 - 41  U/L   ALT 43 0 - 44 U/L   Alkaline Phosphatase 37 (L) 38 - 126 U/L   Total Bilirubin 0.4 0.0 - 1.2 mg/dL   GFR, Estimated 48 (L) >60 mL/min    Comment: (NOTE) Calculated using the CKD-EPI Creatinine Equation (2021)    Anion gap 13 5 - 15    Comment: Performed at [facility], [address] [place], [place] [address]  Lipase, blood     Status: None   Collection Time: 09/18/23 11:44 AM  Result Value Ref Range   Lipase 51 11 - 51 U/L    Comment: Performed at [facility], [address] [place], [place] [address]  Procalcitonin     Status: None   Collection Time: 09/18/23 11:44 AM  Result Value Ref Range   Procalcitonin 2.28 ng/mL    Comment:        Interpretation: PCT > 2 ng/mL: Systemic infection (sepsis) is likely, unless other causes are known. (NOTE)       Sepsis PCT Algorithm           Lower Respiratory Tract                                      Infection PCT Algorithm    ----------------------------     ----------------------------         PCT <  0.25 ng/mL                PCT < 0.10 ng/mL          Strongly encourage             Strongly discourage   discontinuation of antibiotics    initiation of antibiotics    ----------------------------     -----------------------------       PCT 0.25 - 0.50 ng/mL            PCT 0.10 - 0.25 ng/mL               OR       >80% decrease in PCT            Discourage initiation of                                            antibiotics      Encourage discontinuation           of antibiotics    ----------------------------     -----------------------------         PCT >= 0.50 ng/mL              PCT 0.26 - 0.50 ng/mL               AND       <80% decrease in PCT              Encourage initiation of                                             antibiotics       Encourage continuation           of antibiotics    ----------------------------     -----------------------------  PCT >= 0.50 ng/mL                  PCT > 0.50 ng/mL               AND         increase in PCT                  Strongly encourage                                      initiation of antibiotics    Strongly encourage escalation           of antibiotics                                     -----------------------------                                           PCT <= 0.25 ng/mL                                                 OR                                        > 80% decrease in PCT                                      Discontinue / Do not initiate                                             antibiotics  Performed at [facility], [address] [place], [place] [address]   Urinalysis, Routine w reflex microscopic -Urine, Clean Catch     Status: Abnormal   Collection Time: 09/18/23  5:33 PM  Result Value Ref Range   Color, Urine YELLOW (A) YELLOW   APPearance CLEAR (A) CLEAR   Specific Gravity, Urine >1.046 (H) 1.005 - 1.030   pH 5.0 5.0 - 8.0   Glucose, UA >=500 (A) NEGATIVE mg/dL   Hgb urine dipstick  NEGATIVE NEGATIVE   Bilirubin Urine NEGATIVE NEGATIVE   Ketones, ur NEGATIVE NEGATIVE mg/dL   Protein, ur NEGATIVE NEGATIVE mg/dL   Nitrite NEGATIVE NEGATIVE   Leukocytes,Ua NEGATIVE NEGATIVE   RBC / HPF 0-5 0 - 5 RBC/hpf   WBC, UA 0-5 0 - 5 WBC/hpf   Bacteria, UA MANY (A) NONE SEEN   Squamous Epithelial / HPF 0-5 0 - 5 /HPF   Mucus PRESENT    Hyaline Casts, UA PRESENT     Comment: Performed at [facility], [address] [place], [place] [address]  C Difficile Quick Screen w PCR reflex     Status: None   Collection Time: 09/18/23  9:53 PM   Specimen:  STOOL  Result Value Ref Range   C Diff antigen NEGATIVE NEGATIVE   C Diff toxin NEGATIVE NEGATIVE   C Diff interpretation No C. difficile detected.     Comment: Performed at [facility], [address] [place], [place] [address]  Gastrointestinal Panel by PCR , Stool     Status: Abnormal   Collection Time: 09/18/23  9:53 PM   Specimen: STOOL  Result Value Ref Range   Campylobacter species NOT DETECTED NOT DETECTED   Plesimonas shigelloides NOT DETECTED NOT DETECTED   Salmonella species NOT DETECTED NOT DETECTED   Yersinia enterocolitica NOT DETECTED NOT DETECTED   Vibrio species NOT DETECTED NOT DETECTED   Vibrio cholerae NOT DETECTED NOT DETECTED   Enteroaggregative E coli (EAEC) NOT DETECTED NOT DETECTED   Enteropathogenic E coli (EPEC) DETECTED (A) NOT DETECTED    Comment: RESULT CALLED TO, READ BACK BY AND VERIFIED WITH: TIFFANY LISENMEYER RN @0015  09/19/23 ASW    Enterotoxigenic E coli (ETEC) NOT DETECTED NOT DETECTED   Shiga like toxin producing E coli (STEC) NOT DETECTED NOT DETECTED   Shigella/Enteroinvasive E coli (EIEC) NOT DETECTED NOT DETECTED   Cryptosporidium NOT DETECTED NOT DETECTED   Cyclospora cayetanensis NOT DETECTED NOT DETECTED   Entamoeba histolytica NOT DETECTED NOT DETECTED   Giardia lamblia NOT DETECTED NOT DETECTED   Adenovirus F40/41 NOT DETECTED NOT DETECTED   Astrovirus NOT  DETECTED NOT DETECTED   Norovirus GI/GII DETECTED (A) NOT DETECTED    Comment: RESULT CALLED TO, READ BACK BY AND VERIFIED WITH: TIFFANY LISENMEYER RN @0015  09/19/23 ASW    Rotavirus A NOT DETECTED NOT DETECTED   Sapovirus (I, II, IV, and V) NOT DETECTED NOT DETECTED    Comment: Performed at [facility], [address] [place], [place] [address]  HIV Antibody (routine testing w rflx)     Status: None   Collection Time: 09/19/23  1:50 AM  Result Value Ref Range   HIV Screen 4th Generation wRfx Non Reactive Non Reactive    Comment: Performed at [facility], [address], [place], [place] [address]  Basic metabolic panel     Status: Abnormal   Collection Time: 09/19/23  1:50 AM  Result Value Ref Range   Sodium 135 135 - 145 mmol/L   Potassium 3.3 (L) 3.5 - 5.1 mmol/L   Chloride 100 98 - 111 mmol/L   CO2 19 (L) 22 - 32 mmol/L   Glucose, Bld 230 (H) 70 - 99 mg/dL    Comment: Glucose reference range applies only to samples taken after fasting for at least 8 hours.   BUN 54 (H) 6 - 20 mg/dL   Creatinine, Ser 9.14 (H) 0.44 - 1.00 mg/dL   Calcium 6.8 (L) 8.9 - 10.3 mg/dL   GFR, Estimated 40 (L) >60 mL/min    Comment: (NOTE) Calculated using the CKD-EPI Creatinine Equation (2021)    Anion gap 16 (H) 5 - 15    Comment: Performed at [facility], [address] [place], [place] [address]  CBC with Differential/Platelet     Status: Abnormal   Collection Time: 09/19/23  1:50 AM  Result Value Ref Range   WBC 4.8 4.0 - 10.5 K/uL   RBC 4.08 3.87 - 5.11 MIL/uL   Hemoglobin 12.1 12.0 - 15.0 g/dL   HCT 62.1 30.8 - 65.7 %   MCV 89.7 80.0 - 100.0 fL   MCH 29.7 26.0 - 34.0 pg   MCHC 33.1 30.0 - 36.0  g/dL   RDW 16.1 09.6 - 04.5 %   Platelets 196 150 - 400 K/uL   nRBC 0.0 0.0 - 0.2 %   Neutrophils Relative % 81 %   Neutro Abs 3.9 1.7 - 7.7 K/uL   Lymphocytes Relative 11 %   Lymphs Abs 0.5 (L) 0.7 - 4.0 K/uL   Monocytes Relative 6 %   Monocytes Absolute 0.3 0.1 -  1.0 K/uL   Eosinophils Relative 0 %   Eosinophils Absolute 0.0 0.0 - 0.5 K/uL   Basophils Relative 1 %   Basophils Absolute 0.0 0.0 - 0.1 K/uL   Immature Granulocytes 1 %   Abs Immature Granulocytes 0.04 0.00 - 0.07 K/uL    Comment: Performed at [facility], [address] [place], [place] [address]  Lactic acid, plasma     Status: None   Collection Time: 09/19/23  1:50 AM  Result Value Ref Range   Lactic Acid, Venous 0.8 0.5 - 1.9 mmol/L    Comment: Performed at [facility], [address] [place], [place] [address]  Culture, blood (Routine X 2) w Reflex to ID Panel     Status: None   Collection Time: 09/19/23  1:50 AM   Specimen: BLOOD  Result Value Ref Range   Specimen Description      BLOOD BLOOD RIGHT ARM Performed at [facility], [address], [place], [place] [address]    Special Requests      BOTTLES DRAWN AEROBIC AND ANAEROBIC Blood Culture adequate volume Performed at [facility], [address], [place], [place] [address]    Culture      NO GROWTH 5 DAYS Performed at [facility], [address] [place], [place] [address]    Report Status 09/24/2023 FINAL   Magnesium     Status: Abnormal   Collection Time: 09/19/23  1:50 AM  Result Value Ref Range   Magnesium 1.6 (L) 1.7 - 2.4 mg/dL    Comment: Performed at [facility], [address] [place], [place] [address]  Phosphorus     Status: None   Collection Time: 09/19/23  1:50 AM  Result Value Ref Range   Phosphorus 4.2 2.5 - 4.6 mg/dL    Comment: Performed at [facility], [address] [place], [place] [address]  Culture, blood (Routine X 2) w Reflex to ID Panel     Status: None   Collection Time: 09/19/23  2:10 AM   Specimen: BLOOD RIGHT ARM  Result Value Ref Range   Specimen Description      BLOOD RIGHT ARM Performed at [facility], [address], [place], [place] [address]    Special Requests      BOTTLES DRAWN  AEROBIC AND ANAEROBIC BCLV Performed at [facility], [address] [place], [place] [address]    Culture      NO GROWTH 5 DAYS Performed at [facility], [address], [place], [place] [address]    Report Status 09/24/2023 FINAL   CBG monitoring, ED     Status: Abnormal   Collection Time: 09/19/23  9:03 AM  Result Value Ref Range   Glucose-Capillary 200 (H) 70 - 99 mg/dL    Comment: Glucose reference range applies only to samples taken after fasting for at least 8 hours.  CBG monitoring, ED     Status: Abnormal   Collection Time: 09/19/23 11:40 AM  Result Value Ref Range   Glucose-Capillary 246 (H)  70 - 99 mg/dL    Comment: Glucose reference range applies only to samples taken after fasting for at least 8 hours.  CBG monitoring, ED     Status: Abnormal   Collection Time: 09/19/23  4:15 PM  Result Value Ref Range   Glucose-Capillary 136 (H) 70 - 99 mg/dL    Comment: Glucose reference range applies only to samples taken after fasting for at least 8 hours.  CBG monitoring, ED     Status: Abnormal   Collection Time: 09/19/23 10:21 PM  Result Value Ref Range   Glucose-Capillary 130 (H) 70 - 99 mg/dL    Comment: Glucose reference range applies only to samples taken after fasting for at least 8 hours.  Magnesium     Status: None   Collection Time: 09/20/23  3:20 AM  Result Value Ref Range   Magnesium 2.4 1.7 - 2.4 mg/dL    Comment: Performed at [facility], [address] [place], [place] [address]  Phosphorus     Status: Abnormal   Collection Time: 09/20/23  3:20 AM  Result Value Ref Range   Phosphorus 1.7 (L) 2.5 - 4.6 mg/dL    Comment: Performed at [facility], [address] [place], [place] [address]  Basic metabolic panel     Status: Abnormal   Collection Time: 09/20/23  3:20 AM  Result Value Ref Range   Sodium 143 135 - 145 mmol/L    Comment: REPEATED TO VERIFY   SH   Potassium 2.8 (L) 3.5 - 5.1 mmol/L   Chloride 107 98 - 111  mmol/L   CO2 24 22 - 32 mmol/L   Glucose, Bld 84 70 - 99 mg/dL    Comment: Glucose reference range applies only to samples taken after fasting for at least 8 hours.   BUN 24 (H) 6 - 20 mg/dL   Creatinine, Ser 8.29 0.44 - 1.00 mg/dL   Calcium 7.3 (L) 8.9 - 10.3 mg/dL   GFR, Estimated >56 >21 mL/min    Comment: (NOTE) Calculated using the CKD-EPI Creatinine Equation (2021)    Anion gap 12 5 - 15    Comment: Performed at [facility], [address] [place], [place] [address]  CBC     Status: Abnormal   Collection Time: 09/20/23  3:20 AM  Result Value Ref Range   WBC 4.8 4.0 - 10.5 K/uL   RBC 3.79 (L) 3.87 - 5.11 MIL/uL   Hemoglobin 11.0 (L) 12.0 - 15.0 g/dL   HCT 78.4 (L) 69.6 - 29.5 %   MCV 94.5 80.0 - 100.0 fL   MCH 29.0 26.0 - 34.0 pg   MCHC 30.7 30.0 - 36.0 g/dL   RDW 28.4 13.2 - 44.0 %   Platelets 139 (L) 150 - 400 K/uL   nRBC 0.0 0.0 - 0.2 %    Comment: Performed at [facility], [address] [place], [place] [address]  CBG monitoring, ED     Status: None   Collection Time: 09/20/23  8:50 AM  Result Value Ref Range   Glucose-Capillary 84 70 - 99 mg/dL    Comment: Glucose reference range applies only to samples taken after fasting for at least 8 hours.  CBG monitoring, ED     Status: Abnormal   Collection Time: 09/20/23 12:18 PM  Result Value Ref Range   Glucose-Capillary 138 (H) 70 - 99 mg/dL    Comment: Glucose reference range applies only to samples taken after fasting for at least  8 hours.  Glucose, capillary     Status: Abnormal   Collection Time: 09/20/23  6:37 PM  Result Value Ref Range   Glucose-Capillary 144 (H) 70 - 99 mg/dL    Comment: Glucose reference range applies only to samples taken after fasting for at least 8 hours.  Glucose, capillary     Status: Abnormal   Collection Time: 09/20/23  8:40 PM  Result Value Ref Range   Glucose-Capillary 120 (H) 70 - 99 mg/dL    Comment: Glucose reference range applies only to samples taken  after fasting for at least 8 hours.  Magnesium     Status: None   Collection Time: 09/21/23  5:22 AM  Result Value Ref Range   Magnesium 2.1 1.7 - 2.4 mg/dL    Comment: Performed at [facility], [address] [place], [place] [address]  Phosphorus     Status: Abnormal   Collection Time: 09/21/23  5:22 AM  Result Value Ref Range   Phosphorus 1.5 (L) 2.5 - 4.6 mg/dL    Comment: Performed at [facility], [address] [place], [place] [address]  Basic metabolic panel     Status: Abnormal   Collection Time: 09/21/23  5:22 AM  Result Value Ref Range   Sodium 140 135 - 145 mmol/L   Potassium 4.3 3.5 - 5.1 mmol/L   Chloride 110 98 - 111 mmol/L   CO2 22 22 - 32 mmol/L   Glucose, Bld 119 (H) 70 - 99 mg/dL    Comment: Glucose reference range applies only to samples taken after fasting for at least 8 hours.   BUN 12 6 - 20 mg/dL   Creatinine, Ser 9.62 0.44 - 1.00 mg/dL   Calcium 7.3 (L) 8.9 - 10.3 mg/dL   GFR, Estimated >95 >28 mL/min    Comment: (NOTE) Calculated using the CKD-EPI Creatinine Equation (2021)    Anion gap 8 5 - 15    Comment: Performed at [facility], [address] [place], [place] [address]  CBC     Status: Abnormal   Collection Time: 09/21/23  5:22 AM  Result Value Ref Range   WBC 4.7 4.0 - 10.5 K/uL   RBC 3.56 (L) 3.87 - 5.11 MIL/uL   Hemoglobin 10.3 (L) 12.0 - 15.0 g/dL   HCT 40.1 (L) 02.7 - 25.3 %   MCV 90.2 80.0 - 100.0 fL   MCH 28.9 26.0 - 34.0 pg   MCHC 32.1 30.0 - 36.0 g/dL   RDW 66.4 40.3 - 47.4 %   Platelets 166 150 - 400 K/uL   nRBC 0.0 0.0 - 0.2 %    Comment: Performed at [facility], [address] [place], [place] [address]  Glucose, capillary     Status: Abnormal   Collection Time: 09/21/23  8:56 AM  Result Value Ref Range   Glucose-Capillary 121 (H) 70 - 99 mg/dL    Comment: Glucose reference range applies only to samples taken after fasting for at least 8 hours.  Glucose, capillary     Status:  Abnormal   Collection Time: 09/21/23 11:46 AM  Result Value Ref Range   Glucose-Capillary 144 (H) 70 - 99 mg/dL    Comment: Glucose reference range applies only to samples taken after fasting for at least 8 hours.  Glucose, capillary     Status: Abnormal   Collection Time: 09/21/23  4:23 PM  Result Value Ref Range   Glucose-Capillary 156 (H) 70 - 99 mg/dL  Comment: Glucose reference range applies only to samples taken after fasting for at least 8 hours.  Magnesium     Status: None   Collection Time: 09/22/23  5:49 AM  Result Value Ref Range   Magnesium 1.8 1.7 - 2.4 mg/dL    Comment: Performed at [facility], [address] [place], [place] [address]  Phosphorus     Status: Abnormal   Collection Time: 09/22/23  5:49 AM  Result Value Ref Range   Phosphorus 2.3 (L) 2.5 - 4.6 mg/dL    Comment: Performed at [facility], [address] [place], [place] [address]  Basic metabolic panel     Status: Abnormal   Collection Time: 09/22/23  5:49 AM  Result Value Ref Range   Sodium 140 135 - 145 mmol/L   Potassium 4.2 3.5 - 5.1 mmol/L   Chloride 108 98 - 111 mmol/L   CO2 28 22 - 32 mmol/L   Glucose, Bld 90 70 - 99 mg/dL    Comment: Glucose reference range applies only to samples taken after fasting for at least 8 hours.   BUN 11 6 - 20 mg/dL   Creatinine, Ser 8.29 0.44 - 1.00 mg/dL   Calcium 8.0 (L) 8.9 - 10.3 mg/dL   GFR, Estimated >56 >21 mL/min    Comment: (NOTE) Calculated using the CKD-EPI Creatinine Equation (2021)    Anion gap 4 (L) 5 - 15    Comment: Performed at [facility], [address] [place], [place] [address]  CBC     Status: Abnormal   Collection Time: 09/22/23  5:49 AM  Result Value Ref Range   WBC 4.4 4.0 - 10.5 K/uL   RBC 3.65 (L) 3.87 - 5.11 MIL/uL   Hemoglobin 10.6 (L) 12.0 - 15.0 g/dL   HCT 78.4 (L) 69.6 - 29.5 %   MCV 91.5 80.0 - 100.0 fL   MCH 29.0 26.0 - 34.0 pg   MCHC 31.7 30.0 - 36.0 g/dL   RDW 28.4 13.2 - 44.0 %    Platelets 167 150 - 400 K/uL   nRBC 0.0 0.0 - 0.2 %    Comment: Performed at [facility], [address] [place], [place] [address]  Glucose, capillary     Status: None   Collection Time: 09/22/23  8:05 AM  Result Value Ref Range   Glucose-Capillary 90 70 - 99 mg/dL    Comment: Glucose reference range applies only to samples taken after fasting for at least 8 hours.  Glucose, capillary     Status: Abnormal   Collection Time: 09/22/23 11:34 AM  Result Value Ref Range   Glucose-Capillary 125 (H) 70 - 99 mg/dL    Comment: Glucose reference range applies only to samples taken after fasting for at least 8 hours.    Diabetic Foot Exam:     PHQ2/9:    10/01/2023    9:37 AM 06/20/2023   12:26 PM 05/16/2023   11:02 AM 05/09/2023   11:49 AM 03/19/2023    8:39 AM  Depression screen PHQ 2/9  Decreased Interest 0 2 2 2 2   Down, Depressed, Hopeless 0 1 1 2 1   PHQ - 2 Score 0 3 3 4 3   Altered sleeping 0 0 0 0 0  Tired, decreased energy 0 0 0 3 0  Change in appetite 0 1 1 0 0  Feeling bad or failure about yourself  0 0 0 2 0  Trouble concentrating 0 3 2 2  1  Moving slowly or fidgety/restless 0 0 0 0 0  Suicidal thoughts 0 0 0 0 0  PHQ-9 Score 0 7 6 11 4   Difficult doing work/chores Not difficult at all Somewhat difficult  Not difficult at all Somewhat difficult    phq 9 is negative  Fall Risk:    10/01/2023    9:37 AM 06/20/2023   12:26 PM 05/16/2023   10:58 AM 05/09/2023   11:00 AM 03/19/2023    8:39 AM  Fall Risk   Falls in the past year? 0 1 1 1 1   Number falls in past yr: 0 1 1 1 1   Injury with Fall? 0 0 0 0 0  Risk for fall due to : No Fall Risks History of fall(s) Impaired balance/gait History of fall(s) Impaired balance/gait  Follow up Falls prevention discussed;Education provided;Falls evaluation completed Follow up appointment;Falls prevention discussed;Education provided;Falls evaluation completed Falls prevention discussed Falls prevention discussed;Education  provided;Falls evaluation completed Falls prevention discussed;Education provided;Falls evaluation completed     Assessment & Plan   1. History of sepsis (Primary)  Improving, stay off Trulicity since it is causing nausea, get a sooner visit with her Endo   2. Hospital discharge follow-up  - CBC with Differential/Platelet - BASIC METABOLIC PANEL WITH GFR - Phosphorus  3. Thrombocytopenia (HCC)  - CBC with Differential/Platelet  4. Gastroenteritis  Doing well now   5. Acute kidney injury superimposed on chronic kidney disease (HCC)  - BASIC METABOLIC PANEL WITH GFR - Phosphorus  6. Adult hypothyroidism  - TSH  7. Atherosclerosis of aorta (HCC)  - ezetimibe (ZETIA) 10 MG tablet; Take 1 tablet (10 mg total) by mouth daily.  Dispense: 90 tablet; Refill: 0   8. History of recent fall   Likely due to orthostatic changes, advised to stay hydrated and get up slowly to avoid more falls, tender during palpation of face, front teeth are loose and she will see dentist tomorrow, offered CT face but she would like to hold off since feeling better now.   9. Insulin dependent type 2 diabetes mellitus (HCC)  Discussed importance of following up with Endo, she was advised to stop Trulicity due to nausea and also unable to get toujeo, also out of strips. Advised to called them today and schedule a sooner follow up visit

## 2023-10-02 ENCOUNTER — Encounter: Payer: Self-pay | Admitting: Family Medicine

## 2023-10-02 ENCOUNTER — Other Ambulatory Visit: Payer: Self-pay

## 2023-10-02 DIAGNOSIS — K080 Exfoliation of teeth due to systemic causes: Secondary | ICD-10-CM | POA: Diagnosis not present

## 2023-10-02 LAB — CBC WITH DIFFERENTIAL/PLATELET
Absolute Lymphocytes: 2538 {cells}/uL (ref 850–3900)
Absolute Monocytes: 429 {cells}/uL (ref 200–950)
Basophils Absolute: 67 {cells}/uL (ref 0–200)
Basophils Relative: 0.9 %
Eosinophils Absolute: 200 {cells}/uL (ref 15–500)
Eosinophils Relative: 2.7 %
HCT: 39.2 % (ref 35.0–45.0)
Hemoglobin: 12.4 g/dL (ref 11.7–15.5)
MCH: 28.9 pg (ref 27.0–33.0)
MCHC: 31.6 g/dL — ABNORMAL LOW (ref 32.0–36.0)
MCV: 91.4 fL (ref 80.0–100.0)
MPV: 9.6 fL (ref 7.5–12.5)
Monocytes Relative: 5.8 %
Neutro Abs: 4166 {cells}/uL (ref 1500–7800)
Neutrophils Relative %: 56.3 %
Platelets: 344 10*3/uL (ref 140–400)
RBC: 4.29 10*6/uL (ref 3.80–5.10)
RDW: 12.4 % (ref 11.0–15.0)
Total Lymphocyte: 34.3 %
WBC: 7.4 10*3/uL (ref 3.8–10.8)

## 2023-10-02 LAB — BASIC METABOLIC PANEL WITH GFR
BUN/Creatinine Ratio: 16 (calc) (ref 6–22)
BUN: 21 mg/dL (ref 7–25)
CO2: 28 mmol/L (ref 20–32)
Calcium: 10.3 mg/dL (ref 8.6–10.4)
Chloride: 102 mmol/L (ref 98–110)
Creat: 1.32 mg/dL — ABNORMAL HIGH (ref 0.50–1.03)
Glucose, Bld: 218 mg/dL — ABNORMAL HIGH (ref 65–99)
Potassium: 4.6 mmol/L (ref 3.5–5.3)
Sodium: 141 mmol/L (ref 135–146)
eGFR: 47 mL/min/{1.73_m2} — ABNORMAL LOW (ref >=60)

## 2023-10-02 LAB — TSH: TSH: 0.41 m[IU]/L (ref 0.40–4.50)

## 2023-10-02 LAB — PHOSPHORUS: Phosphorus: 4 mg/dL (ref 2.5–4.5)

## 2023-10-02 NOTE — Patient Outreach (Signed)
  Care Management  Transitions of Care Program Transitions of Care Post-discharge week 2  10/02/2023 Name: MAIZIE GARNO MRN: [id number] DOB: November 08, 1965  Subjective: CARLIS BURNSWORTH is a 58 y.o. year old female who is a primary care patient of Alba Cory, MD. The Care Management team spoke with patient by telephone to assess and address transitions of care needs. Unfortunately she reports she had a fall, broke several front teeth and is currently at [facility] awaiting procedures . Requested a call later or another day   Plan: Additional outreach attempts will be made to reach the patient enrolled in the [facility] Program (Post Inpatient/ED Visit).on an alternate day to allow her recovery time from anticipated procedure   Susa Loffler , BSN, RN [facility]   VBCI-Population Health RN Care Manager Direct Dial [phone]  Fax: [phone] Website: [email or web address]

## 2023-10-03 ENCOUNTER — Telehealth: Payer: Self-pay

## 2023-10-03 NOTE — Patient Outreach (Signed)
  Care Management  Transitions of Care Program Transitions of Care Post-discharge week 2  10/03/2023 Name: [name] MRN: [id number] DOB: May 21, 1966  Subjective: [name] is a 58 y.o. year old female who is a primary care patient of Alba Cory, MD. The Care Management team was unable to reach the patient by phone to assess and address transitions of care needs.  1 st call 10/02/23 2nd call 10/03/23   Patient was called in an Outreach attempt during VBCI  30-day TOC program.  Unfortunately, I was not able to speak with the patient in regards to recent hospital discharge     Patient's voicemail has  a generic greeting. To maintain HIPAA compliance, left message with VBCI CM contact information and a request for a call back .   Plan: Additional outreach attempts will be made to reach the patient enrolled in the [facility] Program (Post Inpatient/ED Visit).  Susa Loffler , BSN, RN [facility]   VBCI-Population Health RN Care Manager Direct Dial [phone]  Fax: [phone] Website: [email or web address]

## 2023-10-04 ENCOUNTER — Telehealth: Payer: Self-pay

## 2023-10-04 NOTE — Patient Instructions (Signed)
Visit Information  Thank you for taking time to visit with me today. Please don't hesitate to contact me if I can be of assistance to you before our next scheduled telephone appointment.  Our next appointment is by telephone on 10/11/23 at 10;00am  Following is a copy of your care plan:   Goals Addressed             This Visit's Progress    TOC Care Pan       Current Barriers:  Medication management new medications,  Provider appointments PCP and   RNCM Clinical Goal(s):  Patient will work with the Care Management team over the next 30 days to address Transition of Care Barriers: Medication Management Provider appointments take all medications exactly as prescribed and will call provider for medication related questions as evidenced by no missed medication doses attend all scheduled medical appointments: with PCP and Specialist as evidenced by no missed follow-up appointments   through collaboration with RN Care manager, provider, and care team.   Interventions: Evaluation of current treatment plan related to  self management and patient's adherence to plan as established by provider  Transitions of Care:  Goal on track:  Yes. Doctor Visits  - discussed the importance of doctor visits Post discharge activity limitations prescribed by provider reviewed Reviewed Signs and symptoms of infection  Patient Goals/Self-Care Activities: Participate in Transition of Care Program/Attend TOC scheduled calls Take all medications as prescribed Attend all scheduled provider appointments Call pharmacy for medication refills 3-7 days in advance of running out of medications Attend church or other social activities Perform all self care activities independently  Perform IADL's (shopping, preparing meals, housekeeping, managing finances) independently Call provider office for new concerns or questions   Follow Up Plan:  Telephone follow up appointment with care management team member scheduled  for:  10/11/23 @ 10:00 am  The patient has been provided with contact information for the care management team and has been advised to call with any health related questions or concerns.         Medication review  Reviewed current home medications -- provided education as needed. Patient is aware of potential side effects and was encouraged to notify PCP for any adverse side effects or unwanted symptoms not relieved with interventions  Patient will call 911 for Medical Emergencies or Life -Threatening Symptoms.  Reviewed goals for care Patient/ Caregiver verbalizes understanding of instructions with the plan of care . The  Patient / Caregiver was encouraged to make informed decisions about care, actively participate in managing health conditions, and implement lifestyle changes as needed to promote independence and self-management of healthcare. SDOH screenings have been completed and addressed if indicted.  There are no reported barriers to care.    Follow-up Plan VBCI Case Management Nurse will provide follow-up and on-going assessment ,evaluation and education of disease processes, recommended interventions for both chronic and acute medical conditions ,  along with ongoing review of symptoms ,medication reviews / reconciliation during each weekly call . Any updates , inconsistencies, discrepancies or acute care concerns will be addressed and routed to the correct Practitioner if indicated   [facility]  Please call the care guide team at [phone]  if you need to cancel or reschedule your appointment . For scheduled calls -Three attempts will be made to reach you -if the scheduled call is missed or  we are unable to reach the you after 3 attempts no additional outreach attempts will be made and  the TOC follow-up will be closed .   If you need to speak to a Nurse you may  call me directly at the number below or if I am unavailable,and  your need is urgent  please call the main  VBCI number at [phone] and ask to speak with one of the [facility] ( Transition of Care )  Nurses  .  Patient was encouraged to Contact PCP with any changes in baseline or  medication regimen,  changes in health status  /  well-being, safety concerns, including falls any questions or concerns regarding ongoing medical care, any difficulty obtaining or picking up prescriptions, any changes or worsening in condition- including  symptoms not relieved  with interventions                                                                            Additionally, If you experience worsening of your symptoms, develop shortness of breath, If you are experiencing a medical emergency,  develop suicidal or homicidal thoughts you must seek medical attention immediately by calling 911 or report to your local emergency department or urgent care.   If you have a non-emergency medical problem during routine business hours, please contact your provider's office and ask to speak with a nurse.       Please take the time to read instructions/literature along with the possible adverse reactions/side effects for all the Medicines that have been prescribed to you. Only take newly prescribed  Medications after you have completely understood and accept all the possible adverse reactions/side effects.   Do not take more than prescribed Medications for  Pain, Sleep and Anxiety. Do not drive when taking Pain medications or sleep aid/ insomnia  medications It is not advisable to combine anxiety, sleep and pain medications without talking with your primary care practitioner    If you are experiencing a Mental Health or Behavioral Health Crisis or need someone to talk to Please call the Suicide and Crisis Lifeline: 988 You may also call the [place] National Suicide Prevention Lifeline: 301 379 5162 or TTY: 431-855-5709 TTY 618-427-9411) to talk to a trained counselor.  You may call the Behavioral Health Crisis Line at 762-475-7597,  at any time, 24 hours a day, 7 days a week- however If you are in danger or need immediate medical attention, call 911.   If you would like help to quit smoking, call 1-800-QUIT-NOW ( 254 458 4838) OR Espaol: 1-855-Djelo-Ya (4-742-595-6387) o para ms informacin haga clic aqu or Text READY to 564-332 to register via text.   Susa Loffler , BSN, RN [place]   VBCI-Population Health RN Care Manager Direct Dial [phone]  Website: [email or web address]

## 2023-10-04 NOTE — Patient Outreach (Signed)
Care Management  Transitions of Care Program Transitions of Care Post-discharge week 2   10/04/2023 Name: [name] MRN: [id number] DOB: [birth year]  Subjective: [name] is a 58 y.o. year old female who is a primary care patient of Alba Cory, MD. The Care Management team Engaged with patient Engaged with patient by telephone to assess and address transitions of care needs.   Consent to Services:  Patient was given information about care management services, agreed to services, and gave verbal consent to participate.   Assessment:   Patient/ Caregiver  voices no new complaints or concerns  and has not developed/ reported any new medical issues / Dx or acute changes. - since last follow-up call for most recent  Hospital stay     1/14-1/18 / 2025 She is doing well She is still upset and discussing her difficulty with IV meds and Infusions. She still reports pain to her arm. She has an Orthopedic apt 10/08/23. She saw PCP 1/27 with NNO , she noted dysuria but did not get a UA , she is going to [facility] regarding her incontinence products  Medication reconciliation/ review completed based on most recent medication list in EHR; and in review of recent Provider follow-up appointments- confirmed patient obtained / is taking all newly prescribed medications as instructed and is aware of any changes to previous medications regimen including dosage adjustments. Patient self-Caregiver manages medications; denies questions/ concerns or barriers to medication adherence at this time  Patient / Caregiver educated on red flag s/s to watch for based on current discharge diagnosis and was encouraged to report, any changes in baseline or  medication regimen,   or any new unmanaged side effects or symptoms not relieved with interventions  to PCP and / or the  VBCI Case Management team .          SDOH Interventions    Flowsheet Row Telephone from 09/24/2023 in [place] POPULATION HEALTH DEPARTMENT  Telephone from 09/19/2022 in [facility] Community Care Coordination ED to Hosp-Admission (Discharged) from 09/10/2022 in [facility] GENERAL SURGERY Telephone from 02/15/2022 in [facility] Community Care Coordination Clinical Support from 02/02/2022 in [facility] Clinical Support from 02/01/2021 in [facility]  SDOH Interventions        Food Insecurity Interventions Intervention Not Indicated -- Other (Comment), Inpatient TOC  [Resources added to AVS] [id number] Referral, Other (Comment) Other (Comment)  [community resource referral sent] --  Housing Interventions Intervention Not Indicated Intervention Not Indicated -- -- Intervention Not Indicated --  Transportation Interventions Intervention Not Indicated, Patient Resources Dietitian) -- -- -- Intervention Not Indicated --  Utilities Interventions Intervention Not Indicated Intervention Not Indicated -- -- -- --  Depression Interventions/Treatment  -- -- -- -- Medication, Currently on Treatment Medication, Currently on Treatment  Financial Strain Interventions -- -- -- -- Intervention Not Indicated --  Physical Activity Interventions -- -- -- -- Intervention Not Indicated --  Stress Interventions -- -- -- -- Intervention Not Indicated Intervention Not Indicated  Social Connections Interventions Intervention Not Indicated -- -- -- Intervention Not Indicated --        Goals Addressed             This Visit's Progress    TOC Care Pan       Current Barriers:  Medication management new medications,  Provider appointments PCP and   RNCM Clinical Goal(s):  Patient will work with the Care Management team  over the next 30 days to address Transition of Care Barriers: Medication Management Provider appointments take all medications exactly as prescribed and will call provider for medication related questions as evidenced by no missed medication  doses attend all scheduled medical appointments: with PCP and Specialist as evidenced by no missed follow-up appointments   through collaboration with RN Care manager, provider, and care team.   Interventions: Evaluation of current treatment plan related to  self management and patient's adherence to plan as established by provider  Transitions of Care:  Goal on track:  Yes. Doctor Visits  - discussed the importance of doctor visits Post discharge activity limitations prescribed by provider reviewed Reviewed Signs and symptoms of infection  Patient Goals/Self-Care Activities: Participate in Transition of Care Program/Attend TOC scheduled calls Take all medications as prescribed Attend all scheduled provider appointments Call pharmacy for medication refills 3-7 days in advance of running out of medications Attend church or other social activities Perform all self care activities independently  Perform IADL's (shopping, preparing meals, housekeeping, managing finances) independently Call provider office for new concerns or questions   Follow Up Plan:  Telephone follow up appointment with care management team member scheduled for:  10/11/23 @ 10:00 am  The patient has been provided with contact information for the care management team and has been advised to call with any health related questions or concerns.          Plan:  Routine follow-up and on-going assessment evaluation and education of disease processes, recommended interventions for both chronic and acute medical conditions , will occur during each weekly visit along with ongoing review of symptoms ,medication reviews and reconciliation. Any updates , inconsistencies, discrepancies or acute care concerns will be addressed and routed to the correct Practitioner if indicated   Based on current information and Insurance plan -Reviewed benefits available to patient, including details about eligibility options for care if any area of  needs were identified.  Reviewed patients ability to access and / or navigating the benefits system..Amb Referral made if indicted , refer to orders section of note for details   Please refer to Care Plan for goals and interventions -Effectiveness of interventions, symptom management and outcomes will be evaluated  weekly during [facility] 30-day Program Outreach calls  . Any necessary  changes and updates to Care Plan will be completed episodically    Reviewed goals for care Patient verbalizes understanding of instructions and care plan provided. Patient was encouraged to make informed decisions about their care, actively participate in managing their health condition, and implement lifestyle changes as needed to promote independence and self-management of health care   Patient was encouraged to Contact PCP with any changes in baseline or  medication regimen,  changes in health status  /  well-being, safety concerns, including falls any questions or concerns regarding ongoing medical care, any difficulty obtaining or picking up prescriptions, any changes or worsening in condition- including  symptoms not relieved  with interventions    The patient has been provided with contact information for the care management team and has been advised to call with any health-related questions or concerns. Follow up as indicated with Care Team , or sooner should any new problems arise.      Susa Loffler , BSN, RN [facility]   VBCI-Population Health RN Care Manager Direct Dial [phone]  Fax: [phone] Website: [email or web address]

## 2023-10-08 DIAGNOSIS — I5032 Chronic diastolic (congestive) heart failure: Secondary | ICD-10-CM | POA: Diagnosis not present

## 2023-10-08 DIAGNOSIS — I2510 Atherosclerotic heart disease of native coronary artery without angina pectoris: Secondary | ICD-10-CM | POA: Diagnosis not present

## 2023-10-08 DIAGNOSIS — N1831 Chronic kidney disease, stage 3a: Secondary | ICD-10-CM | POA: Diagnosis not present

## 2023-10-08 DIAGNOSIS — E1122 Type 2 diabetes mellitus with diabetic chronic kidney disease: Secondary | ICD-10-CM | POA: Diagnosis not present

## 2023-10-11 ENCOUNTER — Other Ambulatory Visit: Payer: Self-pay

## 2023-10-11 NOTE — Patient Outreach (Signed)
  Care Management  Transitions of Care Program Transitions of Care Post-discharge week 3  10/11/2023 Name: [name] MRN: [id number] DOB: 06/12/1966  Subjective: [name] is a 58 y.o. year old female who is a primary care patient of Sowles, Krichna, MD. The Care Management team was unable to reach the patient by phone to assess and address transitions of care needs.  Patient  Outreach attempt is in the course of  VBCI  30-day TOC program. Pt previously agreed and is enrolled in the  program due to potential risk for readmission and/or high utilization. Unfortunately, I was not able to speak with the patient in regards to recent hospital discharge   Left a HIPAA compliant phone message message for patient including VBCI CM contact information with request for a call back in regard to recent hospital discharge     Plan: Additional outreach attempts will be made to reach the patient enrolled in the [facility] Program (Post Inpatient/ED Visit).  Bari Mayans , BSN, RN [facility]   VBCI-Population Health RN Care Manager Direct Dial [phone]  Fax: [phone] Website: [email or web address]

## 2023-10-12 ENCOUNTER — Telehealth: Payer: Self-pay

## 2023-10-12 NOTE — Patient Outreach (Signed)
  Care Management  Transitions of Care Program Transitions of Care Post-discharge week 3  10/12/2023 Name: [name] MRN: [id number] DOB: Feb 13, 1966  Subjective: [name] is a 58 y.o. year old female who is a primary care patient of Sowles, Krichna, MD. The Care Management team was unable to reach the patient by phone to assess and address transitions of care needs.  Patient  Outreach attempt is in the course of  [facility]I  30-day TOC program. Pt previously agreed and is enrolled in the  program due to potential risk for readmission and/or high utilization. Unfortunately, I was not able to speak with the patient in regards to recent hospital discharge   Left  a HIPAA compliant phone message message for patient including VBCI CM contact information with request for a call back in regards to recent hospital discharge and TOC  status review   Plan: Additional outreach attempts will be made to reach the patient enrolled in the [facility] Program (Post Inpatient/ED Visit).  Bari Mayans , BSN, RN [facility]   VBCI-Population Health RN Care Manager Direct Dial [phone]  Fax: [phone] Website: [email or web address]

## 2023-10-17 ENCOUNTER — Telehealth: Payer: Self-pay

## 2023-10-17 NOTE — Patient Outreach (Signed)
Care Management  Transitions of Care Program Transitions of Care Post-discharge week 3   10/17/2023 Name: [name] MRN: [id number] DOB: 1965-12-17  Subjective: [name] is a 58 y.o. year old female who is a primary care patient of Alba Cory, MD. The Care Management team Engaged with patient Engaged with patient by telephone to assess and address transitions of care needs.   Consent to Services:  Patient was given information about care management services, agreed to services, and gave verbal consent to participate.   Assessment:   Patient voices no new complaints or concerns  and has not developed/ reported any new medical issues / Dx or acute changes. - since last follow-up call for most recent  Hospital stay   1/14-1/18  / 2025 She is doing very well. She missed call last week , states she is back to her usual routine, no more falls She is using the blister packs for medication adherence and states it is going well.  Medication reconciliation/ review completed based on most recent medication list in EHR; and in review of recent Provider follow-up appointments- confirmed patient obtained / is taking all newly prescribed medications as instructed and is aware of any changes to previous medications regimen including dosage adjustments. Patient self-manages medications; denies questions/ concerns or barriers to medication adherence at this time  Patient educated on red flag s/s to watch for based on current discharge diagnosis and was encouraged to report, any changes in baseline or  medication regimen,   or any new unmanaged side effects or symptoms not relieved with interventions  to PCP and / or the  VBCI Case Management team .          SDOH Interventions    Flowsheet Row Telephone from 09/24/2023 in [place] POPULATION HEALTH DEPARTMENT Telephone from 09/19/2022 in [facility] Community Care Coordination ED to Hosp-Admission (Discharged) from 09/10/2022 in  [facility]ENTER GENERAL SURGERY Telephone from 02/15/2022 in [facility] Community Care Coordination Clinical Support from 02/02/2022 in [facility] Clinical Support from 02/01/2021 in [facility]  SDOH Interventions        Food Insecurity Interventions Intervention Not Indicated -- Other (Comment), Inpatient TOC  [Resources added to AVS] [id number] Referral, Other (Comment) Other (Comment)  [community resource referral sent] --  Housing Interventions Intervention Not Indicated Intervention Not Indicated -- -- Intervention Not Indicated --  Transportation Interventions Intervention Not Indicated, Patient Resources Dietitian) -- -- -- Intervention Not Indicated --  Utilities Interventions Intervention Not Indicated Intervention Not Indicated -- -- -- --  Depression Interventions/Treatment  -- -- -- -- Medication, Currently on Treatment Medication, Currently on Treatment  Financial Strain Interventions -- -- -- -- Intervention Not Indicated --  Physical Activity Interventions -- -- -- -- Intervention Not Indicated --  Stress Interventions -- -- -- -- Intervention Not Indicated Intervention Not Indicated  Social Connections Interventions Intervention Not Indicated -- -- -- Intervention Not Indicated --        Goals Addressed             This Visit's Progress    TOC Care Pan       Current Barriers:  Medication management new medications,  Provider appointments PCP and   RNCM Clinical Goal(s):  Patient will work with the Care Management team over the next 30 days to address Transition of Care Barriers: Medication Management Provider appointments take all medications exactly as prescribed and will call provider  for medication related questions as evidenced by no missed medication doses attend all scheduled medical appointments: with PCP and Specialist as evidenced by no missed follow-up appointments    through collaboration with RN Care manager, provider, and care team.   Interventions: Evaluation of current treatment plan related to  self management and patient's adherence to plan as established by provider  Transitions of Care:  Goal on track:  Yes. Doctor Visits  - discussed the importance of doctor visits Post discharge activity limitations prescribed by provider reviewed Reviewed Signs and symptoms of infection  Patient Goals/Self-Care Activities: Participate in Transition of Care Program/Attend TOC scheduled calls Take all medications as prescribed Attend all scheduled provider appointments Call pharmacy for medication refills 3-7 days in advance of running out of medications Attend church or other social activities Perform all self care activities independently  Perform IADL's (shopping, preparing meals, housekeeping, managing finances) independently Call provider office for new concerns or questions   Follow Up Plan:  Telephone follow up appointment with care management team member scheduled for:  10/24/23 @ 10:00 am  The patient has been provided with contact information for the care management team and has been advised to call with any health related questions or concerns.          Plan:  Routine follow-up and on-going assessment evaluation and education of disease processes, recommended interventions for both chronic and acute medical conditions , will occur during each weekly visit along with ongoing review of symptoms ,medication reviews and reconciliation. Any updates , inconsistencies, discrepancies or acute care concerns will be addressed and routed to the correct Practitioner if indicated   Based on current information and Insurance plan -Reviewed benefits available to patient, including details about eligibility options for care if any area of needs were identified.  Reviewed patients ability to access and / or navigating the benefits system..Amb Referral made if  indicted , refer to orders section of note for details   Please refer to Care Plan for goals and interventions -Effectiveness of interventions, symptom management and outcomes will be evaluated  weekly during [facility] 30-day Program Outreach calls  . Any necessary  changes and updates to Care Plan will be completed episodically    Reviewed goals for care Patient verbalizes understanding of instructions and care plan provided. Patient was encouraged to make informed decisions about their care, actively participate in managing their health condition, and implement lifestyle changes as needed to promote independence and self-management of health care   Patient was encouraged to Contact PCP with any changes in baseline or  medication regimen,  changes in health status  /  well-being, safety concerns, including falls any questions or concerns regarding ongoing medical care, any difficulty obtaining or picking up prescriptions, any changes or worsening in condition- including  symptoms not relieved  with interventions    The patient has been provided with contact information for the care management team and has been advised to call with any health-related questions or concerns. Follow up as indicated with Care Team , or sooner should any new problems arise.   The patient has been provided with contact information for the care management team and has been advised to call with any health related questions or concerns.   Susa Loffler , BSN, RN [facility]   VBCI-Population Health RN Care Manager Direct Dial [phone]  Fax: [phone] Website: [email or web address]

## 2023-10-23 DIAGNOSIS — H2513 Age-related nuclear cataract, bilateral: Secondary | ICD-10-CM | POA: Diagnosis not present

## 2023-10-23 DIAGNOSIS — H1132 Conjunctival hemorrhage, left eye: Secondary | ICD-10-CM | POA: Diagnosis not present

## 2023-10-23 DIAGNOSIS — H1045 Other chronic allergic conjunctivitis: Secondary | ICD-10-CM | POA: Diagnosis not present

## 2023-10-23 DIAGNOSIS — E113293 Type 2 diabetes mellitus with mild nonproliferative diabetic retinopathy without macular edema, bilateral: Secondary | ICD-10-CM | POA: Diagnosis not present

## 2023-10-23 LAB — HM DIABETES EYE EXAM

## 2023-10-24 ENCOUNTER — Other Ambulatory Visit: Payer: Self-pay

## 2023-10-24 ENCOUNTER — Telehealth: Payer: Self-pay

## 2023-10-24 NOTE — Patient Instructions (Signed)
Visit Information  Thank you for taking time to visit with me today. Please don't hesitate to contact me if I can be of assistance to you    Following is a copy of your care plan:   Goals Addressed             This Visit's Progress    COMPLETED: TOC Care Pan       Current Barriers:  Medication management new medications,  Provider appointments PCP and   RNCM Clinical Goal(s):  Patient will work with the Care Management team over the next 30 days to address Transition of Care Barriers: Medication Management Provider appointments take all medications exactly as prescribed and will call provider for medication related questions as evidenced by no missed medication doses attend all scheduled medical appointments: with PCP and Specialist as evidenced by no missed follow-up appointments   through collaboration with RN Care manager, provider, and care team.   Interventions: Evaluation of current treatment plan related to  self management and patient's adherence to plan as established by provider  Transitions of Care:  [place] Not Met. Doctor Visits  - discussed the importance of doctor visits Post discharge activity limitations prescribed by provider reviewed Reviewed Signs and symptoms of infection  Patient Goals/Self-Care Activities: Participate in Transition of Care Program/Attend TOC scheduled calls Take all medications as prescribed Attend all scheduled provider appointments Call pharmacy for medication refills 3-7 days in advance of running out of medications Attend church or other social activities Perform all self care activities independently  Perform IADL's (shopping, preparing meals, housekeeping, managing finances) independently Call provider office for new concerns or questions   Follow Up Plan:  The patient has been provided with contact information for the care management team and has been advised to call with any health related questions or concerns.  No further  follow up required: 30 day TOC Program completion           Medication review  Reviewed current home medications -- provided education as needed. Patient is aware of potential side effects and was encouraged to notify PCP for any adverse side effects or unwanted symptoms not relieved with interventions  Patient will call 911 for Medical Emergencies or Life -Threatening Symptoms.  Reviewed goals for care Patient/ Caregiver verbalizes understanding of instructions with the plan of care . The  Patient / Caregiver was encouraged to make informed decisions about care, actively participate in managing health conditions, and implement lifestyle changes as needed to promote independence and self-management of healthcare. SDOH screenings have been completed and addressed if indicted.  There are no reported barriers to care.    Follow-up Plan VBCI Case Management Nurse will provide follow-up and on-going assessment ,evaluation and education of disease processes, recommended interventions for both chronic and acute medical conditions ,  along with ongoing review of symptoms ,medication reviews / reconciliation during each weekly call . Any updates , inconsistencies, discrepancies or acute care concerns will be addressed and routed to the correct Practitioner if indicated   [facility]  Please call the care guide team at [phone]  if you need to cancel or reschedule your appointment . For scheduled calls -Three attempts will be made to reach you -if the scheduled call is missed or  we are unable to reach the you after 3 attempts no additional outreach attempts will be made and the TOC follow-up will be closed .   If you need to speak to a Nurse you may  call me directly at the number below or if I am unavailable,and  your need is urgent  please call the main VBCI number at [phone] and ask to speak with one of the [facility] ( Transition of Care )  Nurses  .  Patient was encouraged to  Contact PCP with any changes in baseline or  medication regimen,  changes in health status  /  well-being, safety concerns, including falls any questions or concerns regarding ongoing medical care, any difficulty obtaining or picking up prescriptions, any changes or worsening in condition- including  symptoms not relieved  with interventions                                                                            Additionally, If you experience worsening of your symptoms, develop shortness of breath, If you are experiencing a medical emergency,  develop suicidal or homicidal thoughts you must seek medical attention immediately by calling 911 or report to your local emergency department or urgent care.   If you have a non-emergency medical problem during routine business hours, please contact your provider's office and ask to speak with a nurse.       Please take the time to read instructions/literature along with the possible adverse reactions/side effects for all the Medicines that have been prescribed to you. Only take newly prescribed  Medications after you have completely understood and accept all the possible adverse reactions/side effects.   Do not take more than prescribed Medications for  Pain, Sleep and Anxiety. Do not drive when taking Pain medications or sleep aid/ insomnia  medications It is not advisable to combine anxiety, sleep and pain medications without talking with your primary care practitioner    If you are experiencing a Mental Health or Behavioral Health Crisis or need someone to talk to Please call the Suicide and Crisis Lifeline: 988 You may also call the [place] National Suicide Prevention Lifeline: 769-588-1866 or TTY: 509-209-0808 TTY 2142545140) to talk to a trained counselor.  You may call the Behavioral Health Crisis Line at 940-303-2350, at any time, 24 hours a day, 7 days a week- however If you are in danger or need immediate medical attention, call 911.    If you would like help to quit smoking, call 1-800-QUIT-NOW ( 502 110 0948) OR Espaol: 1-855-Djelo-Ya (3-329-518-8416) o para ms informacin haga clic aqu or Text READY to 606-301 to register via text.   Susa Loffler , BSN, RN [facility]   VBCI-Population Health RN Care Manager Direct Dial [phone]  Website: [email or web address]

## 2023-10-24 NOTE — Telephone Encounter (Signed)
 Patient was identified as falling into the [place] Measure - Diabetes.   Patient was: Appointment scheduled with primary care provider in the next 30 days.

## 2023-10-24 NOTE — Patient Outreach (Signed)
Care Management  Transitions of Care Program Transitions of Care Post-discharge 30 day TOC ProgramCompletion   10/24/2023 Name: [name] MRN: [id number] DOB: 10/16/1965  Subjective: [name] is a 58 y.o. year old female who is a primary care patient of Alba Cory, MD. The Care Management team Engaged with patient Engaged with patient by telephone to assess and address transitions of care needs.   Consent to Services:  Patient was given information about care management services, agreed to services, and gave verbal consent to participate.   Assessment:   Patient/ Caregiver  voices no new complaints or concerns  and has not developed/ reported any new medical issues / Dx or acute changes. - since last follow-up call for most recent Hospital stay    1/14-1/18  / 2025 She is doing well, She continues with blister packs for her medications, She has had no medication changes , no recent falls She has completed her follow-up visits and had no concerns  Medication reconciliation/ review completed based on most recent medication list in EHR; and in review of recent Provider follow-up appointments- confirmed patient obtained / is taking all newly prescribed medications as instructed and is aware of any changes to previous medications regimen including dosage adjustments. Patient self-Caregiver manages medications; denies questions/ concerns or barriers to medication adherence at this time  Patient / Caregiver educated on red flag s/s to watch for based on current discharge diagnosis and was encouraged to report, any changes in baseline or  medication regimen,   or any new unmanaged side effects or symptoms not relieved with interventions  to PCP and / or the  [facility] Case Management team .          SDOH Interventions    Flowsheet Row Telephone from 09/24/2023 in [facility] DEPARTMENT Telephone from 09/19/2022 in [facility] Community Care Coordination ED to  Hosp-Admission (Discharged) from 09/10/2022 in [facility] Telephone from 02/15/2022 in [facility] Community Care Coordination Clinical Support from 02/02/2022 in [facility] Clinical Support from 02/01/2021 in [facility]  SDOH Interventions        Food Insecurity Interventions Intervention Not Indicated -- Other (Comment), Inpatient TOC  [Resources added to AVS] [id number] Referral, Other (Comment) Other (Comment)  [community resource referral sent] --  Housing Interventions Intervention Not Indicated Intervention Not Indicated -- -- Intervention Not Indicated --  Transportation Interventions Intervention Not Indicated, Patient Resources Dietitian) -- -- -- Intervention Not Indicated --  Utilities Interventions Intervention Not Indicated Intervention Not Indicated -- -- -- --  Depression Interventions/Treatment  -- -- -- -- Medication, Currently on Treatment Medication, Currently on Treatment  Financial Strain Interventions -- -- -- -- Intervention Not Indicated --  Physical Activity Interventions -- -- -- -- Intervention Not Indicated --  Stress Interventions -- -- -- -- Intervention Not Indicated Intervention Not Indicated  Social Connections Interventions Intervention Not Indicated -- -- -- Intervention Not Indicated --        Goals Addressed             This Visit's Progress    COMPLETED: TOC Care Pan       Current Barriers:  Medication management new medications,  Provider appointments PCP and   RNCM Clinical Goal(s):  Patient will work with the Care Management team over the next 30 days to address Transition of Care Barriers: Medication Management Provider appointments take all medications exactly as prescribed and  will call provider for medication related questions as evidenced by no missed medication doses attend all scheduled medical appointments: with PCP and Specialist  as evidenced by no missed follow-up appointments   through collaboration with RN Care manager, provider, and care team.   Interventions: Evaluation of current treatment plan related to  self management and patient's adherence to plan as established by provider  Transitions of Care:  [place] Not Met. Doctor Visits  - discussed the importance of doctor visits Post discharge activity limitations prescribed by provider reviewed Reviewed Signs and symptoms of infection  Patient Goals/Self-Care Activities: Participate in Transition of Care Program/Attend TOC scheduled calls Take all medications as prescribed Attend all scheduled provider appointments Call pharmacy for medication refills 3-7 days in advance of running out of medications Attend church or other social activities Perform all self care activities independently  Perform IADL's (shopping, preparing meals, housekeeping, managing finances) independently Call provider office for new concerns or questions   Follow Up Plan:  The patient has been provided with contact information for the care management team and has been advised to call with any health related questions or concerns.  No further follow up required: 30 day TOC Program completion           Plan:   Routine follow-up and on-going assessment evaluation and education of disease processes, recommended interventions for both chronic and acute medical conditions , will occur during each weekly visit along with ongoing review of symptoms ,medication reviews and reconciliation. Any updates , inconsistencies, discrepancies or acute care concerns will be addressed and routed to the correct Practitioner if indicated   Based on current information and Insurance plan -Reviewed benefits available to patient, including details about eligibility options for care if any area of needs were identified.  Reviewed patients ability to access and / or navigating the benefits system..Amb Referral made  if indicted , refer to orders section of note for details   Please refer to Care Plan for goals and interventions -Effectiveness of interventions, symptom management and outcomes will be evaluated  weekly during [facility] 30-day Program Outreach calls  . Any necessary  changes and updates to Care Plan will be completed episodically    Reviewed goals for care Patient verbalizes understanding of instructions and care plan provided. Patient was encouraged to make informed decisions about their care, actively participate in managing their health condition, and implement lifestyle changes as needed to promote independence and self-management of health care   Patient was encouraged to Contact PCP with any changes in baseline or  medication regimen,  changes in health status  /  well-being, safety concerns, including falls any questions or concerns regarding ongoing medical care, any difficulty obtaining or picking up prescriptions, any changes or worsening in condition- including  symptoms not relieved  with interventions    The patient has been provided with contact information for the care management team and has been advised to call with any health-related questions or concerns. Follow up as indicated with Care Team , or sooner should any new problems arise.  The patient has been provided with contact information for the care management team and has been advised to call with any health related questions or concerns.   Susa Loffler , BSN, RN [facility]   VBCI-Population Health RN Care Manager Direct Dial [phone]  Fax: [phone] Website: [email or web address]

## 2023-11-01 ENCOUNTER — Ambulatory Visit: Payer: Self-pay | Admitting: Family Medicine

## 2023-11-12 ENCOUNTER — Other Ambulatory Visit: Payer: Self-pay | Admitting: Cardiology

## 2023-11-12 DIAGNOSIS — R002 Palpitations: Secondary | ICD-10-CM

## 2023-11-21 DIAGNOSIS — F250 Schizoaffective disorder, bipolar type: Secondary | ICD-10-CM | POA: Diagnosis not present

## 2023-11-21 DIAGNOSIS — F411 Generalized anxiety disorder: Secondary | ICD-10-CM | POA: Diagnosis not present

## 2023-12-08 ENCOUNTER — Other Ambulatory Visit: Payer: Self-pay | Admitting: Family Medicine

## 2023-12-08 DIAGNOSIS — I700 Atherosclerosis of aorta: Secondary | ICD-10-CM

## 2023-12-11 ENCOUNTER — Emergency Department

## 2023-12-11 ENCOUNTER — Emergency Department
Admission: EM | Admit: 2023-12-11 | Discharge: 2023-12-11 | Disposition: A | Discharge status: Home / Self Care | Attending: Emergency Medicine | Admitting: Emergency Medicine

## 2023-12-11 ENCOUNTER — Other Ambulatory Visit: Payer: Self-pay

## 2023-12-11 DIAGNOSIS — R748 Abnormal levels of other serum enzymes: Secondary | ICD-10-CM | POA: Insufficient documentation

## 2023-12-11 DIAGNOSIS — R109 Unspecified abdominal pain: Secondary | ICD-10-CM

## 2023-12-11 DIAGNOSIS — I10 Essential (primary) hypertension: Secondary | ICD-10-CM | POA: Diagnosis not present

## 2023-12-11 DIAGNOSIS — K5901 Slow transit constipation: Secondary | ICD-10-CM | POA: Insufficient documentation

## 2023-12-11 DIAGNOSIS — R1031 Right lower quadrant pain: Secondary | ICD-10-CM | POA: Diagnosis not present

## 2023-12-11 DIAGNOSIS — E1122 Type 2 diabetes mellitus with diabetic chronic kidney disease: Secondary | ICD-10-CM | POA: Insufficient documentation

## 2023-12-11 DIAGNOSIS — E1165 Type 2 diabetes mellitus with hyperglycemia: Secondary | ICD-10-CM | POA: Diagnosis not present

## 2023-12-11 DIAGNOSIS — K859 Acute pancreatitis without necrosis or infection, unspecified: Secondary | ICD-10-CM | POA: Diagnosis not present

## 2023-12-11 DIAGNOSIS — R160 Hepatomegaly, not elsewhere classified: Secondary | ICD-10-CM | POA: Diagnosis not present

## 2023-12-11 DIAGNOSIS — N189 Chronic kidney disease, unspecified: Secondary | ICD-10-CM | POA: Diagnosis not present

## 2023-12-11 DIAGNOSIS — I509 Heart failure, unspecified: Secondary | ICD-10-CM | POA: Diagnosis not present

## 2023-12-11 DIAGNOSIS — R112 Nausea with vomiting, unspecified: Secondary | ICD-10-CM | POA: Diagnosis not present

## 2023-12-11 LAB — COMPREHENSIVE METABOLIC PANEL WITH GFR
ALT: 25 U/L (ref 0–44)
AST: 31 U/L (ref 15–41)
Albumin: 3.4 g/dL — ABNORMAL LOW (ref 3.5–5.0)
Alkaline Phosphatase: 40 U/L (ref 38–126)
Anion gap: 10 (ref 5–15)
BUN: 31 mg/dL — ABNORMAL HIGH (ref 6–20)
CO2: 25 mmol/L (ref 22–32)
Calcium: 8.9 mg/dL (ref 8.9–10.3)
Chloride: 103 mmol/L (ref 98–111)
Creatinine, Ser: 1.15 mg/dL — ABNORMAL HIGH (ref 0.44–1.00)
GFR, Estimated: 55 mL/min — ABNORMAL LOW (ref >60)
Glucose, Bld: 483 mg/dL — ABNORMAL HIGH (ref 70–99)
Potassium: 4.8 mmol/L (ref 3.5–5.1)
Sodium: 138 mmol/L (ref 135–145)
Total Bilirubin: 0.7 mg/dL (ref 0.0–1.2)
Total Protein: 5.9 g/dL — ABNORMAL LOW (ref 6.5–8.1)

## 2023-12-11 LAB — CBC
HCT: 37.2 % (ref 36.0–46.0)
Hemoglobin: 11.7 g/dL — ABNORMAL LOW (ref 12.0–15.0)
MCH: 29.1 pg (ref 26.0–34.0)
MCHC: 31.5 g/dL (ref 30.0–36.0)
MCV: 92.5 fL (ref 80.0–100.0)
Platelets: 195 10*3/uL (ref 150–400)
RBC: 4.02 MIL/uL (ref 3.87–5.11)
RDW: 13 % (ref 11.5–15.5)
WBC: 4.7 10*3/uL (ref 4.0–10.5)
nRBC: 0 % (ref 0.0–0.2)

## 2023-12-11 LAB — URINALYSIS, ROUTINE W REFLEX MICROSCOPIC
Bacteria, UA: NONE SEEN
Bilirubin Urine: NEGATIVE
Glucose, UA: >=500 mg/dL — AB
Hgb urine dipstick: NEGATIVE
Ketones, ur: NEGATIVE mg/dL
Leukocytes,Ua: NEGATIVE
Nitrite: NEGATIVE
Protein, ur: NEGATIVE mg/dL
Specific Gravity, Urine: 1.024 (ref 1.005–1.030)
pH: 5 (ref 5.0–8.0)

## 2023-12-11 LAB — LIPASE, BLOOD: Lipase: 186 U/L — ABNORMAL HIGH (ref 11–51)

## 2023-12-11 MED ORDER — IOHEXOL 300 MG/ML  SOLN
100.0000 mL | Freq: Once | INTRAMUSCULAR | Status: AC | PRN
  Administered 2023-12-11: 100 mL via INTRAVENOUS

## 2023-12-11 MED ORDER — ONDANSETRON 4 MG PO TBDP: 4.0000 mg | ORAL_TABLET | Freq: Three times a day (TID) | ORAL | 0 refills | Status: DC | PRN

## 2023-12-11 MED ORDER — FENTANYL CITRATE PF 50 MCG/ML IJ SOSY
50.0000 ug | PREFILLED_SYRINGE | Freq: Once | INTRAMUSCULAR | Status: AC
  Administered 2023-12-11: 50 ug via INTRAVENOUS
  Filled 2023-12-11: qty 1

## 2023-12-11 MED ORDER — TRAMADOL HCL 50 MG PO TABS: 50.0000 mg | ORAL_TABLET | Freq: Four times a day (QID) | ORAL | 0 refills | Status: DC | PRN

## 2023-12-11 MED ORDER — ONDANSETRON HCL 4 MG/2ML IJ SOLN
4.0000 mg | Freq: Once | INTRAMUSCULAR | Status: AC
  Administered 2023-12-11: 4 mg via INTRAVENOUS
  Filled 2023-12-11: qty 2

## 2023-12-11 MED ORDER — POLYETHYLENE GLYCOL 3350 17 G PO PACK: 17.0000 g | PACK | Freq: Every day | ORAL | 0 refills | Status: DC

## 2023-12-11 MED ORDER — SODIUM CHLORIDE 0.9 % IV SOLN: Freq: Once | INTRAVENOUS | Status: AC

## 2023-12-11 NOTE — ED Triage Notes (Signed)
 Pt presents to the ED via POV from home for nausea, vomiting, and RLQ abdominal pain. Pt reports that these sx's started three days ago. Pt reports taking zofran 2 hours ago and states that has given her some relief from the nausea.

## 2023-12-11 NOTE — ED Provider Notes (Signed)
 [facility] Provider Note    Event Date/Time   First MD Initiated Contact with Patient 12/11/23 1346     (approximate)   History   Emesis and Abdominal Pain   HPI  [name] is a 58 y.o. female with a history of diabetes CHF, CKD, UTI, bipolar affective disorder who presents with complaints of nausea vomiting abdominal pain.  Patient describes bilateral flank pain, she is concerned for possible UTI which she has had multiple times in the past leading to sepsis.  Symptoms started over the last couple of days.     Physical Exam   Triage Vital Signs: ED Triage Vitals  Encounter Vitals Group     BP 12/11/23 1247 100/71     Systolic BP Percentile --      Diastolic BP Percentile --      Pulse Rate 12/11/23 1247 74     Resp 12/11/23 1247 18     Temp 12/11/23 1247 98.6 F (37 C)     Temp Source 12/11/23 1247 Oral     SpO2 12/11/23 1247 94 %     Weight 12/11/23 1253 81.6 kg (180 lb)     Height 12/11/23 1253 1.499 m (4\' 11" )     Head Circumference --      Peak Flow --      Pain Score 12/11/23 1252 8     Pain Loc --      Pain Education --      Exclude from Growth Chart --     Most recent vital signs: Vitals:   12/11/23 1247  BP: 100/71  Pulse: 74  Resp: 18  Temp: 98.6 F (37 C)  SpO2: 94%     General: Awake, no distress.  CV:  Good peripheral perfusion.  Resp:  Normal effort.  Abd:  No distention.  Mild tenderness in epigastrium, no CVA tenderness Other:     ED Results / Procedures / Treatments   Labs (all labs ordered are listed, but only abnormal results are displayed) Labs Reviewed  LIPASE, BLOOD - Abnormal; Notable for the following components:      Result Value   Lipase 186 (*)    All other components within normal limits  COMPREHENSIVE METABOLIC PANEL WITH GFR - Abnormal; Notable for the following components:   Glucose, Bld 483 (*)    BUN 31 (*)    Creatinine, Ser 1.15 (*)    Total Protein 5.9 (*)    Albumin 3.4 (*)     GFR, Estimated 55 (*)    All other components within normal limits  CBC - Abnormal; Notable for the following components:   Hemoglobin 11.7 (*)    All other components within normal limits  URINALYSIS, ROUTINE W REFLEX MICROSCOPIC - Abnormal; Notable for the following components:   Color, Urine STRAW (*)    APPearance CLEAR (*)    Glucose, UA >=500 (*)    All other components within normal limits     EKG  ED ECG REPORT I, Jene Every, the attending physician, personally viewed and interpreted this ECG.  Date: 12/11/2023  Rhythm: normal sinus rhythm QRS Axis: normal Intervals: normal ST/T Wave abnormalities: normal Narrative Interpretation: no evidence of acute ischemia    RADIOLOGY CT scan viewed interpret by me, no evidence of SBO, pending radiology review    PROCEDURES:  Critical Care performed:   Procedures   MEDICATIONS ORDERED IN ED: Medications  0.9 %  sodium chloride infusion ( Intravenous New  Bag/Given 12/11/23 1458)  fentaNYL (SUBLIMAZE) injection 50 mcg (50 mcg Intravenous Given 12/11/23 1454)  ondansetron (ZOFRAN) injection 4 mg (4 mg Intravenous Given 12/11/23 1451)  iohexol (OMNIPAQUE) 300 MG/ML solution 100 mL (100 mLs Intravenous Contrast Given 12/11/23 1509)     IMPRESSION / MDM / ASSESSMENT AND PLAN / ED COURSE  I reviewed the triage vital signs and the nursing notes. Patient's presentation is most consistent with acute presentation with potential threat to life or bodily function.  Patient presents with abdominal pain as detailed above, differential includes UTI, pyelonephritis, pancreatitis, ureterolithiasis  Lab work is notable for hyperglycemia and elevated lipase, will treat with IV fluids, IV fentanyl, IV Zofran, obtain CT abdomen pelvis and reevaluate  CT scan is reassuring, constipation noted, no evidence of pancreatitis, patient is feeling improved, appropriate for discharge at this time, will discharge with tramadol which she is  tolerated before, MiraLAX, Zofran, outpatient follow-up recommended, return precautions discussed.      FINAL CLINICAL IMPRESSION(S) / ED DIAGNOSES   Final diagnoses:  Slow transit constipation  Abdominal pain, unspecified abdominal location     Rx / DC Orders   ED Discharge Orders          Ordered    traMADol (ULTRAM) 50 MG tablet  Every 6 hours PRN        12/11/23 1656    polyethylene glycol (MIRALAX) 17 g packet  Daily        12/11/23 1656    ondansetron (ZOFRAN-ODT) 4 MG disintegrating tablet  Every 8 hours PRN        12/11/23 1656             Note:  This document was prepared using Dragon voice recognition software and may include unintentional dictation errors.   Jene Every, MD 12/11/23 [phone]

## 2023-12-12 ENCOUNTER — Telehealth: Payer: Self-pay

## 2023-12-12 NOTE — Transitions of Care (Post Inpatient/ED Visit) (Signed)
   12/12/2023  Name: [name] MRN: [id number] DOB: December 06, 1965  Today's TOC FU Call Status: Today's TOC FU Call Status:: Unsuccessful Call (1st Attempt) Unsuccessful Call (1st Attempt) Date: 12/12/23  Attempted to reach the patient regarding the most recent Inpatient/ED visit.  Follow Up Plan: Additional outreach attempts will be made to reach the patient to complete the Transitions of Care (Post Inpatient/ED visit) call.   Signature Karena Addison, LPN [facility] Nurse Health Advisor Direct Dial [phone]

## 2023-12-17 NOTE — Transitions of Care (Post Inpatient/ED Visit) (Signed)
   12/17/2023  Name: [name] MRN: [id number] DOB: 11/01/1965  Today's TOC FU Call Status: Today's TOC FU Call Status:: Unsuccessful Call (3rd Attempt) Unsuccessful Call (1st Attempt) Date: 12/12/23 Unsuccessful Call (2nd Attempt) Date: 12/17/23 Unsuccessful Call (3rd Attempt) Date: 12/17/23  Attempted to reach the patient regarding the most recent Inpatient/ED visit.  Follow Up Plan: No further outreach attempts will be made at this time. We have been unable to contact the patient.  Signature Darrall Ellison, LPN [facility] Nurse Health Advisor Direct Dial [phone]

## 2023-12-17 NOTE — Transitions of Care (Post Inpatient/ED Visit) (Signed)
   12/17/2023  Name: [name] MRN: [id number] DOB: 07-May-1966  Today's TOC FU Call Status: Today's TOC FU Call Status:: Unsuccessful Call (2nd Attempt) Unsuccessful Call (1st Attempt) Date: 12/12/23 Unsuccessful Call (2nd Attempt) Date: 12/17/23  Attempted to reach the patient regarding the most recent Inpatient/ED visit.  Follow Up Plan: Additional outreach attempts will be made to reach the patient to complete the Transitions of Care (Post Inpatient/ED visit) call.  Signature Darrall Ellison, LPN [facility] Nurse Health Advisor Direct Dial [phone]

## 2024-01-03 ENCOUNTER — Other Ambulatory Visit: Payer: Self-pay | Admitting: Family Medicine

## 2024-01-10 DIAGNOSIS — F411 Generalized anxiety disorder: Secondary | ICD-10-CM | POA: Diagnosis not present

## 2024-01-10 DIAGNOSIS — F250 Schizoaffective disorder, bipolar type: Secondary | ICD-10-CM | POA: Diagnosis not present

## 2024-02-04 ENCOUNTER — Ambulatory Visit: Admitting: Family Medicine

## 2024-02-04 ENCOUNTER — Encounter: Payer: Self-pay | Admitting: Family Medicine

## 2024-02-04 VITALS — BP 110/72 | HR 78 | Temp 98.9°F | Resp 16 | Ht 59.0 in | Wt 171.8 lb

## 2024-02-04 DIAGNOSIS — R6883 Chills (without fever): Secondary | ICD-10-CM

## 2024-02-04 DIAGNOSIS — B3731 Acute candidiasis of vulva and vagina: Secondary | ICD-10-CM

## 2024-02-04 DIAGNOSIS — R300 Dysuria: Secondary | ICD-10-CM

## 2024-02-04 DIAGNOSIS — Z8619 Personal history of other infectious and parasitic diseases: Secondary | ICD-10-CM

## 2024-02-04 DIAGNOSIS — I700 Atherosclerosis of aorta: Secondary | ICD-10-CM

## 2024-02-04 DIAGNOSIS — Z794 Long term (current) use of insulin: Secondary | ICD-10-CM

## 2024-02-04 DIAGNOSIS — E039 Hypothyroidism, unspecified: Secondary | ICD-10-CM

## 2024-02-04 DIAGNOSIS — E119 Type 2 diabetes mellitus without complications: Secondary | ICD-10-CM

## 2024-02-04 LAB — POCT URINALYSIS DIPSTICK
Bilirubin, UA: NEGATIVE
Blood, UA: NEGATIVE
Glucose, UA: POSITIVE — AB
Ketones, UA: NEGATIVE
Leukocytes, UA: NEGATIVE
Nitrite, UA: NEGATIVE
Protein, UA: NEGATIVE
Spec Grav, UA: 1.02 (ref 1.010–1.025)
Urobilinogen, UA: 0.2 U/dL — AB
pH, UA: 5 (ref 5.0–8.0)

## 2024-02-04 MED ORDER — CEFTRIAXONE SODIUM 1 G IJ SOLR: 1.0000 g | Freq: Once | INTRAMUSCULAR | Status: DC

## 2024-02-04 MED ORDER — FLUCONAZOLE 150 MG PO TABS: 150.0000 mg | ORAL_TABLET | ORAL | 0 refills | Status: DC

## 2024-02-04 NOTE — Progress Notes (Signed)
 Name: [name]   MRN: [id number]    DOB: 10/24/1965   Date:02/04/2024       Progress Note  Subjective  Chief Complaint  Chief Complaint  Patient presents with   Headache   Fatigue    On ongoing sx over a week   Nausea   Chills    Discussed the use of AI scribe software for clinical note transcription with the patient, who gave verbal consent to proceed.  History of Present Illness    She has been feeling sick for the past week, nauseated, fatigue,dysuria, chills and some subjective fever worse at night . Denies nasal congestion or rhinorrhea, SOB or cough. She has noticed vaginal itching and burning. Tried monistat without help.  She denies back pain  Admitted for sepsis in January after an UTI  DMII, she has not seen Endo since Dec, states glucose out of control at home, not taking Jardiance  or Trulicity  , only on Toujeo  and glucose over 400 , explained likely the cause of fatigue. Needs to contact Endo   Patient Active Problem List   Diagnosis Date Noted   Sepsis due to Escherichia coli (E. coli) (HCC) 09/19/2023   Pyuria 09/18/2023   Hypoglycemia due to insulin  08/06/2023   Acquired hypothyroidism 06/20/2023   Class 2 severe obesity with serious comorbidity and body mass index (BMI) of 35.0 to 35.9 in adult [facility]) 06/19/2023   Type 2 diabetes mellitus with hyperglycemia, with long-term current use of insulin  (HCC) 06/18/2023   Insulin  dependent type 2 diabetes mellitus (HCC) 03/19/2023   Encephalopathy acute 09/13/2022   Vertigo 02/10/2022   TIA (transient ischemic attack) 10/04/2021   HTN (hypertension) 10/04/2021   Chronic kidney disease, stage 3a (HCC) 10/04/2021   Depression with anxiety 10/04/2021   COPD (chronic obstructive pulmonary disease) (HCC) 10/04/2021   CAD (coronary artery disease) 10/04/2021   Chronic diastolic CHF (congestive heart failure) (HCC) 10/04/2021   Type II diabetes mellitus with renal manifestations (HCC) 06/30/2021   Atherosclerosis of  aorta (HCC) 03/28/2021   Iron deficiency anemia due to chronic blood loss    Polyp of ascending colon    Vitamin B12 deficiency 08/08/2018   Patient is full code 10/30/2017   Coronary artery disease of native artery of native heart with stable angina pectoris (HCC) 10/09/2017   Plantar fascial fibromatosis 10/09/2017   Tendinitis of wrist 10/09/2017   Schizoaffective disorder, bipolar type (HCC) 09/28/2017   Adenomatous colon polyp 10/15/2015   Bipolar affective disorder (HCC) 10/15/2015   Constipation 10/15/2015   HLD (hyperlipidemia) 10/15/2015   NASH (nonalcoholic steatohepatitis) 10/15/2015   Obesity (BMI 35.0-39.9 without comorbidity) 10/15/2015   Low back pain with left-sided sciatica 08/18/2015   Apnea, sleep 05/05/2015   Heart valve disease 05/05/2015   Complicated UTI (urinary tract infection) 03/24/2015   CKD stage 3 due to type 2 diabetes mellitus (HCC) 03/15/2015   Migraine 03/12/2015   Chronic bronchitis (HCC) 01/20/2015   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HCC) 01/20/2015   GERD (gastroesophageal reflux disease) 01/20/2015   Hypothyroid    Vitamin D  deficiency    Urinary incontinence    Liver lesion    Hypomagnesemia    Obstructive sleep apnea 10/20/2013   Hereditary and idiopathic peripheral neuropathy 03/04/2009   Hemorrhoids, external 09/03/2008   Allergic rhinitis 07/04/2007   Interstitial cystitis 07/04/2007    Social History   Tobacco Use   Smoking status: Never   Smokeless tobacco: Never  Substance Use Topics   Alcohol use: No  Current Outpatient Medications:    busPIRone  (BUSPAR ) 30 MG tablet, Take 30 mg by mouth 2 (two) times daily., Disp: , Rfl:    ezetimibe  (ZETIA ) 10 MG tablet, Take 1 tablet (10 mg total) by mouth daily., Disp: 90 tablet, Rfl: 0   fenofibrate  micronized (LOFIBRA) 134 MG capsule, Take 134 mg by mouth daily., Disp: , Rfl:    fluconazole  (DIFLUCAN ) 150 MG tablet, Take 1 tablet (150 mg total) by mouth every other day., Disp: 3  tablet, Rfl: 0   gabapentin  (NEURONTIN ) 600 MG tablet, Take 600 mg by mouth at bedtime., Disp: , Rfl:    lamoTRIgine  (LAMICTAL ) 200 MG tablet, Take 200 mg by mouth 2 (two) times daily. , Disp: , Rfl:    metoprolol  tartrate (LOPRESSOR ) 25 MG tablet, TAKE 1 TABLET BY MOUTH TWICE DAILY, Disp: 90 tablet, Rfl: 3   QUEtiapine  (SEROQUEL ) 300 MG tablet, Take 300 mg by mouth at bedtime., Disp: , Rfl:    atorvastatin  (LIPITOR ) 80 MG tablet, Take 1 tablet (80 mg total) by mouth at bedtime. (Patient not taking: Reported on 02/04/2024), Disp: 90 tablet, Rfl: 1   cetirizine  (ZYRTEC ) 10 MG tablet, Take 1 tablet (10 mg total) by mouth daily. (Patient not taking: Reported on 02/04/2024), Disp: 30 tablet, Rfl: 0   citalopram  (CELEXA ) 20 MG tablet, Take 20 mg by mouth every morning. (Patient not taking: Reported on 02/04/2024), Disp: , Rfl:    empagliflozin  (JARDIANCE ) 25 MG TABS tablet, Take 25 mg by mouth daily. (Patient not taking: Reported on 02/04/2024), Disp: , Rfl:    fluticasone  (FLONASE ) 50 MCG/ACT nasal spray, Place 2 sprays into both nostrils daily. (Patient not taking: Reported on 02/04/2024), Disp: 48 g, Rfl: 0   GLYXAMBI 25-5 MG TABS, Take 1 tablet by mouth daily. (Patient not taking: Reported on 02/04/2024), Disp: , Rfl:    HUMALOG  KWIKPEN 100 UNIT/ML KwikPen, Inject into the skin as needed (high blood sguar). USE ONLY FOR GLUCOSE OVER 300 (Patient not taking: Reported on 02/04/2024), Disp: , Rfl:    HUMULIN  R U-500 KWIKPEN 500 UNIT/ML kwikpen, Inject 50 Units into the skin 3 (three) times daily with meals. (Patient not taking: Reported on 02/04/2024), Disp: , Rfl:    hydrOXYzine  (ATARAX ) 50 MG tablet, Take 50 mg by mouth 3 (three) times daily as needed for anxiety. (Patient not taking: Reported on 02/04/2024), Disp: , Rfl:    meclizine  (ANTIVERT ) 25 MG tablet, Take 25 mg by mouth 3 (three) times daily as needed for dizziness. (Patient not taking: Reported on 02/04/2024), Disp: , Rfl:    Multiple Vitamin (MULTI-VITAMIN)  tablet, Take 1 tablet by mouth daily. (Patient not taking: Reported on 02/04/2024), Disp: , Rfl:    nitroGLYCERIN  (NITROSTAT ) 0.4 MG SL tablet, Place 1 tablet (0.4 mg total) under the tongue every 5 (five) minutes as needed for chest pain. (Patient not taking: Reported on 02/04/2024), Disp: 25 tablet, Rfl: 3   ondansetron  (ZOFRAN -ODT) 4 MG disintegrating tablet, Take 1 tablet (4 mg total) by mouth every 8 (eight) hours as needed for nausea or vomiting. (Patient not taking: Reported on 02/04/2024), Disp: 20 tablet, Rfl: 0   OneTouch Delica Lancets 30G MISC, by Does not apply route. (Patient not taking: Reported on 02/04/2024), Disp: , Rfl:    pantoprazole  (PROTONIX ) 40 MG tablet, Take 1 tablet (40 mg total) by mouth daily for 14 days., Disp: 14 tablet, Rfl: 0   polyethylene glycol (MIRALAX ) 17 g packet, Take 17 g by mouth daily. (Patient not taking: Reported on  02/04/2024), Disp: 14 each, Rfl: 0   SYNTHROID  88 MCG tablet, Take 1 tablet (88 mcg total) by mouth daily before breakfast. (Patient not taking: Reported on 02/04/2024), Disp: 90 tablet, Rfl: 0   traMADol  (ULTRAM ) 50 MG tablet, Take 1 tablet (50 mg total) by mouth every 6 (six) hours as needed. (Patient not taking: Reported on 02/04/2024), Disp: 20 tablet, Rfl: 0  Current Facility-Administered Medications:    cefTRIAXone  (ROCEPHIN ) injection 1 g, 1 g, Intramuscular, Once,   Allergies  Allergen Reactions   Mirtazapine Anaphylaxis    closes throat   Morphine And Codeine Anaphylaxis   Sulfamethoxazole-Trimethoprim Rash    Whelps all over   Amoxicillin  Er Nausea And Vomiting   Codeine Itching and Rash   Hydrocodone -Acetaminophen  Rash   Keflex  [Cephalexin ] Rash    Pt does not remember having a reaction or rash, no airway involvement   Prasterone  Nausea And Vomiting    ROS  Ten systems reviewed and is negative except as mentioned in HPI    Objective  Vitals:   02/04/24 1111  BP: 110/72  Pulse: 78  Resp: 16  Temp: 98.9 F (37.2 C)  TempSrc:  Oral  SpO2: 97%  Weight: 171 lb 12.8 oz (77.9 kg)  Height: 4\' 11"  (1.499 m)    Body mass index is 34.7 kg/m.   Physical Exam Constitutional: Patient appears well-developed and well-nourished. Obese  No distress.  HEENT: head atraumatic, normocephalic, pupils equal and reactive to light, neck supple Cardiovascular: Normal rate, regular rhythm and normal heart sounds.  No murmur heard. No BLE edema. Pulmonary/Chest: Effort normal and breath sounds normal. No respiratory distress. Abdominal: Soft.  There is no tenderness. Negative CVA Psychiatric: Patient has a normal mood and affect. behavior is normal. Judgment and thought content normal.   Recent Results (from the past 2160 hours)  Lipase, blood     Status: Abnormal   Collection Time: 12/11/23 12:55 PM  Result Value Ref Range   Lipase 186 (H) 11 - 51 U/L    Comment: Performed at [facility], [address] [place], [place] [address]  Comprehensive metabolic panel     Status: Abnormal   Collection Time: 12/11/23 12:55 PM  Result Value Ref Range   Sodium 138 135 - 145 mmol/L   Potassium 4.8 3.5 - 5.1 mmol/L   Chloride 103 98 - 111 mmol/L   CO2 25 22 - 32 mmol/L   Glucose, Bld 483 (H) 70 - 99 mg/dL    Comment: Glucose reference range applies only to samples taken after fasting for at least 8 hours.   BUN 31 (H) 6 - 20 mg/dL   Creatinine, Ser 6.04 (H) 0.44 - 1.00 mg/dL   Calcium  8.9 8.9 - 10.3 mg/dL   Total Protein 5.9 (L) 6.5 - 8.1 g/dL   Albumin 3.4 (L) 3.5 - 5.0 g/dL   AST 31 15 - 41 U/L   ALT 25 0 - 44 U/L   Alkaline Phosphatase 40 38 - 126 U/L   Total Bilirubin 0.7 0.0 - 1.2 mg/dL   GFR, Estimated 55 (L) >60 mL/min    Comment: (NOTE) Calculated using the CKD-EPI Creatinine Equation (2021)    Anion gap 10 5 - 15    Comment: Performed at [facility], [address] [place], [place] [address]  CBC     Status: Abnormal   Collection Time: 12/11/23 12:55 PM  Result Value Ref Range   WBC 4.7 4.0 -  10.5 K/uL   RBC  4.02 3.87 - 5.11 MIL/uL   Hemoglobin 11.7 (L) 12.0 - 15.0 g/dL   HCT 40.9 81.1 - 91.4 %   MCV 92.5 80.0 - 100.0 fL   MCH 29.1 26.0 - 34.0 pg   MCHC 31.5 30.0 - 36.0 g/dL   RDW 78.2 95.6 - 21.3 %   Platelets 195 150 - 400 K/uL   nRBC 0.0 0.0 - 0.2 %    Comment: Performed at [facility], [address] [place], [place] [address]  Urinalysis, Routine w reflex microscopic -Urine, Clean Catch     Status: Abnormal   Collection Time: 12/11/23  1:16 PM  Result Value Ref Range   Color, Urine STRAW (A) YELLOW   APPearance CLEAR (A) CLEAR   Specific Gravity, Urine 1.024 1.005 - 1.030   pH 5.0 5.0 - 8.0   Glucose, UA >=500 (A) NEGATIVE mg/dL   Hgb urine dipstick NEGATIVE NEGATIVE   Bilirubin Urine NEGATIVE NEGATIVE   Ketones, ur NEGATIVE NEGATIVE mg/dL   Protein, ur NEGATIVE NEGATIVE mg/dL   Nitrite NEGATIVE NEGATIVE   Leukocytes,Ua NEGATIVE NEGATIVE   RBC / HPF 0-5 0 - 5 RBC/hpf   WBC, UA 0-5 0 - 5 WBC/hpf   Bacteria, UA NONE SEEN NONE SEEN   Squamous Epithelial / HPF 0-5 0 - 5 /HPF    Comment: Performed at [facility], [address], [place], [place] [address]      Assessment & Plan  1. Dysuria (Primary)  - CBC with Differential/Platelet - Comprehensive metabolic panel with GFR - POCT urinalysis dipstick - Sedimentation rate   2. Chills  - CBC with Differential/Platelet - Comprehensive metabolic panel with GFR - POCT urinalysis dipstick - Sedimentation rate - C-reactive protein  3. Hypothyroidism, unspecified type  - TSH  4. History of sepsis  Normal ua, except for glucose. Out of DM medications, advised to resume and return in 2 days for follow up  We will try to send urine for culture   5. Yeast vaginitis  - fluconazole  (DIFLUCAN ) 150 MG tablet; Take 1 tablet (150 mg total) by mouth every other day.  Dispense: 3 tablet; Refill: 0  6. Insulin  dependent type 2 diabetes mellitus (HCC)  Needs to follow up with Endo  and  take medications as prescribed, currently not taking Trulicity  or Jardiance 

## 2024-02-04 NOTE — Addendum Note (Signed)
 Addended by: RENTERIA-GARCIA, Darell Saputo on: 02/04/2024 12:12 PM   Modules accepted: Orders

## 2024-02-05 ENCOUNTER — Ambulatory Visit: Payer: Self-pay | Admitting: Family Medicine

## 2024-02-05 LAB — SEDIMENTATION RATE: Sed Rate: 9 mm/h (ref 0–30)

## 2024-02-05 LAB — CBC WITH DIFFERENTIAL/PLATELET
Absolute Lymphocytes: 2296 {cells}/uL (ref 850–3900)
Absolute Monocytes: 568 {cells}/uL (ref 200–950)
Basophils Absolute: 72 {cells}/uL (ref 0–200)
Basophils Relative: 0.9 %
Eosinophils Absolute: 128 {cells}/uL (ref 15–500)
Eosinophils Relative: 1.6 %
HCT: 39.1 % (ref 35.0–45.0)
Hemoglobin: 12 g/dL (ref 11.7–15.5)
MCH: 27.4 pg (ref 27.0–33.0)
MCHC: 30.7 g/dL — ABNORMAL LOW (ref 32.0–36.0)
MCV: 89.3 fL (ref 80.0–100.0)
MPV: 9.8 fL (ref 7.5–12.5)
Monocytes Relative: 7.1 %
Neutro Abs: 4936 {cells}/uL (ref 1500–7800)
Neutrophils Relative %: 61.7 %
Platelets: 267 10*3/uL (ref 140–400)
RBC: 4.38 10*6/uL (ref 3.80–5.10)
RDW: 11.8 % (ref 11.0–15.0)
Total Lymphocyte: 28.7 %
WBC: 8 10*3/uL (ref 3.8–10.8)

## 2024-02-05 LAB — COMPREHENSIVE METABOLIC PANEL WITH GFR
AG Ratio: 1.9 (calc) (ref 1.0–2.5)
ALT: 17 U/L (ref 6–29)
AST: 23 U/L (ref 10–35)
Albumin: 3.8 g/dL (ref 3.6–5.1)
Alkaline phosphatase (APISO): 51 U/L (ref 37–153)
BUN/Creatinine Ratio: 31 (calc) — ABNORMAL HIGH (ref 6–22)
BUN: 43 mg/dL — ABNORMAL HIGH (ref 7–25)
CO2: 27 mmol/L (ref 20–32)
Calcium: 9.1 mg/dL (ref 8.6–10.4)
Chloride: 102 mmol/L (ref 98–110)
Creat: 1.38 mg/dL — ABNORMAL HIGH (ref 0.50–1.05)
Globulin: 2 g/dL (ref 1.9–3.7)
Glucose, Bld: 399 mg/dL — ABNORMAL HIGH (ref 65–99)
Potassium: 5.5 mmol/L — ABNORMAL HIGH (ref 3.5–5.3)
Sodium: 138 mmol/L (ref 135–146)
Total Bilirubin: 0.2 mg/dL (ref 0.2–1.2)
Total Protein: 5.8 g/dL — ABNORMAL LOW (ref 6.1–8.1)
eGFR: 44 mL/min/{1.73_m2} — ABNORMAL LOW (ref >=60)

## 2024-02-05 LAB — C-REACTIVE PROTEIN: CRP: 10.9 mg/L — ABNORMAL HIGH (ref <8.0)

## 2024-02-05 LAB — URINE CULTURE
MICRO NUMBER:: 16527296
Result:: NO GROWTH
SPECIMEN QUALITY:: ADEQUATE

## 2024-02-05 LAB — TSH: TSH: 7.78 m[IU]/L — ABNORMAL HIGH (ref 0.40–4.50)

## 2024-02-07 ENCOUNTER — Ambulatory Visit: Admitting: Family Medicine

## 2024-02-07 ENCOUNTER — Encounter: Payer: Self-pay | Admitting: Family Medicine

## 2024-02-07 VITALS — BP 120/76 | HR 73 | Resp 16 | Ht 59.0 in | Wt 175.2 lb

## 2024-02-07 DIAGNOSIS — N1831 Chronic kidney disease, stage 3a: Secondary | ICD-10-CM | POA: Diagnosis not present

## 2024-02-07 DIAGNOSIS — Z794 Long term (current) use of insulin: Secondary | ICD-10-CM | POA: Diagnosis not present

## 2024-02-07 DIAGNOSIS — D803 Selective deficiency of immunoglobulin G [IgG] subclasses: Secondary | ICD-10-CM

## 2024-02-07 DIAGNOSIS — E119 Type 2 diabetes mellitus without complications: Secondary | ICD-10-CM

## 2024-02-07 DIAGNOSIS — J410 Simple chronic bronchitis: Secondary | ICD-10-CM

## 2024-02-07 DIAGNOSIS — Z23 Encounter for immunization: Secondary | ICD-10-CM | POA: Diagnosis not present

## 2024-02-07 DIAGNOSIS — I700 Atherosclerosis of aorta: Secondary | ICD-10-CM

## 2024-02-07 DIAGNOSIS — E039 Hypothyroidism, unspecified: Secondary | ICD-10-CM

## 2024-02-07 DIAGNOSIS — I25118 Atherosclerotic heart disease of native coronary artery with other forms of angina pectoris: Secondary | ICD-10-CM

## 2024-02-07 DIAGNOSIS — F250 Schizoaffective disorder, bipolar type: Secondary | ICD-10-CM

## 2024-02-07 LAB — POCT GLYCOSYLATED HEMOGLOBIN (HGB A1C): Hemoglobin A1C: 10.8 % — AB (ref 4.0–5.6)

## 2024-02-07 MED ORDER — FENOFIBRATE MICRONIZED 134 MG PO CAPS: 134.0000 mg | ORAL_CAPSULE | Freq: Every day | ORAL | 1 refills | Status: DC

## 2024-02-07 MED ORDER — ATORVASTATIN CALCIUM 80 MG PO TABS
80.0000 mg | ORAL_TABLET | Freq: Every day | ORAL | 1 refills | Status: DC
Start: 2024-02-07 — End: 2024-07-24

## 2024-02-07 MED ORDER — EZETIMIBE 10 MG PO TABS
10.0000 mg | ORAL_TABLET | Freq: Every day | ORAL | 1 refills | Status: DC
Start: 2024-02-07 — End: 2024-07-29

## 2024-02-07 MED ORDER — LEVOTHYROXINE SODIUM 50 MCG PO TABS
50.0000 ug | ORAL_TABLET | Freq: Every day | ORAL | 1 refills | Status: AC
Start: 2024-02-07 — End: ?

## 2024-02-07 NOTE — Progress Notes (Signed)
 Name: [name]   MRN: [id number]    DOB: 09/09/1965   Date:02/07/2024       Progress Note  Subjective  Chief Complaint  Chief Complaint  Patient presents with   Medical Management of Chronic Issues   Discussed the use of AI scribe software for clinical note transcription with the patient, who gave verbal consent to proceed.  History of Present Illness [name] is a 58 year old female with type 2 diabetes mellitus who presents with uncontrolled blood sugar levels.  She has been experiencing consistently high blood sugar levels, with recent measurements ranging from 300 to 400 mg/dL. Her hemoglobin A1c is currently 10.8%, similar to a previous reading of 10.4%. She recalls a time when her A1c was 8.2% last year. She experiences increased urination and fatigue, which she attributes to her high blood sugar levels. No urinary tract infection or bladder infection is reported. She is currently taking Humulin  R100 but has had issues obtaining medications from the pharmacy.   She has a history of hyperlipidemia and is supposed to be taking atorvastatin , ezetimibe , and fenofibrate  for cholesterol management. She is unsure if she is currently taking these medications as she did not bring her medication list to the appointment.  She has coronary artery disease with angina and is taking metoprolol  25 mg for this condition. She also has a history of atherosclerosis of the aorta and is supposed to be on atorvastatin  for this as well. No current chest pain, shortness of breath when lying flat, or swelling in her legs is reported.  She has hypothyroidism and is supposed to be taking Synthroid  88 mcg, but she has not been taking it consistently. She is unsure how long she has been without it. No symptoms such as hair loss or difficulty swallowing are reported.  She has a history of schizoaffective disorder, bipolar type, and is taking quetiapine , lamotrigine , gabapentin , and citalopram  for this  condition. She also takes hydroxyzine  for anxiety.  She has chronic kidney disease and is not currently seeing a nephrologist. No recent consultation with a kidney specialist is reported.  She has sleep apnea and uses oxygen  at night. No use of inhalers for chronic bronchitis is reported, and she does not report coughing.  She has a history of congestive heart failure and is under the care of a cardiologist. No current symptoms of heart failure such as shortness of breath or leg swelling are reported.  She is allergic to Vicodin, hydrocodone , codeine, and morphine.    Patient Active Problem List   Diagnosis Date Noted   Adult hypothyroidism 06/20/2023   Class 2 severe obesity with serious comorbidity and body mass index (BMI) of 35.0 to 35.9 in adult [facility]) 06/19/2023   Vertigo 02/10/2022   TIA (transient ischemic attack) 10/04/2021   HTN (hypertension) 10/04/2021   Chronic kidney disease, stage 3a (HCC) 10/04/2021   COPD (chronic obstructive pulmonary disease) (HCC) 10/04/2021   Chronic diastolic CHF (congestive heart failure) (HCC) 10/04/2021   Atherosclerosis of aorta (HCC) 03/28/2021   Iron deficiency anemia due to chronic blood loss    Polyp of ascending colon    Vitamin B12 deficiency 08/08/2018   Patient is full code 10/30/2017   Coronary artery disease of native artery of native heart with stable angina pectoris (HCC) 10/09/2017   Plantar fascial fibromatosis 10/09/2017   Tendinitis of wrist 10/09/2017   Schizoaffective disorder, bipolar type (HCC) 09/28/2017   Adenomatous colon polyp 10/15/2015   Bipolar affective disorder (HCC)  10/15/2015   Constipation 10/15/2015   NASH (nonalcoholic steatohepatitis) 10/15/2015   Low back pain with left-sided sciatica 08/18/2015   Type 2 diabetes with complication (HCC) 05/11/2015   Apnea, sleep 05/05/2015   Heart valve disease 05/05/2015   CKD stage 3 due to type 2 diabetes mellitus (HCC) 03/15/2015   Migraine 03/12/2015   Chronic  bronchitis (HCC) 01/20/2015   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 tacking of bladder and the other was the stimulator placement   CHOLECYSTECTOMY     COLONOSCOPY WITH PROPOFOL  N/A 03/04/2019   Procedure: COLONOSCOPY WITH PROPOFOL ;  Surgeon: Marnee Sink, MD;  Location: [facility] ENDOSCOPY;  Service: Endoscopy;  Laterality: N/A;   ESOPHAGOGASTRODUODENOSCOPY (EGD) WITH PROPOFOL  N/A 03/04/2019   Procedure: ESOPHAGOGASTRODUODENOSCOPY (EGD) WITH PROPOFOL ;  Surgeon: Marnee Sink, MD;  Location: [facility] ENDOSCOPY;  Service: Endoscopy;  Laterality: N/A;   INCISION AND DRAINAGE ABSCESS Right 04/16/2019   Procedure: INCISION AND DRAINAGE ABSCESS;  Surgeon: Conrado Delay, DO;  Location: [facility] ORS;  Service: General;  Laterality: Right;   INTERSTIM IMPLANT PLACEMENT N/A 07/16/2023   Procedure: Simona Dublin IMPLANT FIRST STAGE WITH IMPEDANCE CHECK;  Surgeon: Erman Hayward, MD;  Location: [facility] ORS;  Service: Urology;  Laterality: N/A;   INTERSTIM IMPLANT PLACEMENT N/A 07/16/2023   Procedure: Simona Dublin IMPLANT SECOND STAGE;  Surgeon: Erman Hayward, MD;  Location: [facility] ORS;  Service: Urology;  Laterality: N/A;   INTERSTIM IMPLANT REMOVAL N/A 07/16/2023   Procedure: REMOVAL OF INTERSTIM IMPLANT;  Surgeon: Erman Hayward, MD;  Location: [facility] ORS;  Service: Urology;  Laterality: N/A;   INTERSTIM-GENERATOR CHANGE N/A 12/31/2017   Procedure: INTERSTIM-GENERATOR CHANGE;  Surgeon: Erman Hayward, MD;  Location: [facility] ORS;  Service: Urology;   Laterality: N/A;   LEFT HEART CATH AND CORONARY ANGIOGRAPHY Left 04/13/2011   Procedure: LEFT HEART CATH AND CORONARY ANGIOGRAPHY; Location: [facility]; Surgeon: Thomasene Flemings, MD   LIVER BIOPSY  11/2014   Negative   LUNG BIOPSY  2015   small spot on lung   TUBAL LIGATION     UPPER GI ENDOSCOPY     WRIST SURGERY Right 11/02/2019    Family History  Problem Relation Age of Onset   Diabetes Mother    Heart disease Mother    Hyperlipidemia Mother    Hypertension Mother    Kidney disease Mother    Mental illness Mother    Hypothyroidism Mother    Stroke Mother    Osteoporosis Mother    Glaucoma Mother    Congestive Heart Failure Mother    Hypertension Father    Kidney disease Brother    Intracerebral hemorrhage Brother    Breast cancer Maternal Grandmother 60   Heart disease Maternal Grandfather    Rheum arthritis Maternal Grandfather    Cancer Maternal Grandfather        liver   Kidney disease Maternal Grandfather    Cancer Paternal Grandmother        lung   Asthma Daughter    Cancer Daughter  throat   Bipolar disorder Daughter    Bipolar disorder Daughter    Hypertension Daughter    Restless legs syndrome Daughter     Social History   Tobacco Use   Smoking status: Never   Smokeless tobacco: Never  Substance Use Topics   Alcohol use: No     Current Outpatient Medications:    busPIRone  (BUSPAR ) 30 MG tablet, Take 30 mg by mouth 2 (two) times daily., Disp: , Rfl:    citalopram  (CELEXA ) 20 MG tablet, Take by mouth., Disp: , Rfl:    Dulaglutide  (TRULICITY ) 1.5 MG/0.5ML SOAJ, Inject into the skin., Disp: , Rfl:    fluconazole  (DIFLUCAN ) 150 MG tablet, Take 1 tablet (150 mg total) by mouth every other day., Disp: 3 tablet, Rfl: 0   gabapentin  (NEURONTIN ) 600 MG tablet, Take 600 mg by mouth at bedtime., Disp: , Rfl:    hydrOXYzine  (ATARAX ) 50 MG tablet, Take 50 mg by mouth 3 (three) times daily., Disp: , Rfl:    lamoTRIgine  (LAMICTAL ) 200 MG tablet, Take 200 mg by  mouth 2 (two) times daily. , Disp: , Rfl:    levothyroxine  (SYNTHROID ) 50 MCG tablet, Take 1 tablet (50 mcg total) by mouth daily., Disp: 30 tablet, Rfl: 1   metoprolol  tartrate (LOPRESSOR ) 25 MG tablet, TAKE 1 TABLET BY MOUTH TWICE DAILY, Disp: 90 tablet, Rfl: 3   ONETOUCH VERIO test strip, , Disp: , Rfl:    QUEtiapine  (SEROQUEL ) 300 MG tablet, Take 300 mg by mouth at bedtime., Disp: , Rfl:    atorvastatin  (LIPITOR ) 80 MG tablet, Take 1 tablet (80 mg total) by mouth at bedtime., Disp: 90 tablet, Rfl: 1   empagliflozin  (JARDIANCE ) 25 MG TABS tablet, Take 25 mg by mouth daily. (Patient not taking: Reported on 02/04/2024), Disp: , Rfl:    ezetimibe  (ZETIA ) 10 MG tablet, Take 1 tablet (10 mg total) by mouth daily., Disp: 90 tablet, Rfl: 1   fenofibrate  micronized (LOFIBRA) 134 MG capsule, Take 1 capsule (134 mg total) by mouth daily., Disp: 90 capsule, Rfl: 1   Multiple Vitamin (MULTI-VITAMIN) tablet, Take 1 tablet by mouth daily. (Patient not taking: Reported on 02/04/2024), Disp: , Rfl:    nitroGLYCERIN  (NITROSTAT ) 0.4 MG SL tablet, Place 1 tablet (0.4 mg total) under the tongue every 5 (five) minutes as needed for chest pain. (Patient not taking: Reported on 02/04/2024), Disp: 25 tablet, Rfl: 3   OneTouch Delica Lancets 30G MISC, by Does not apply route. (Patient not taking: Reported on 02/04/2024), Disp: , Rfl:   Allergies  Allergen Reactions   Mirtazapine Anaphylaxis    closes throat   Morphine And Codeine Anaphylaxis   Sulfamethoxazole-Trimethoprim Rash    Whelps all over   Amoxicillin  Er Nausea And Vomiting   Codeine Itching and Rash   Hydrocodone -Acetaminophen  Rash   Keflex  [Cephalexin ] Rash    Pt does not remember having a reaction or rash, no airway involvement   Prasterone  Nausea And Vomiting    I personally reviewed active problem list, medication list, allergies with the patient/caregiver today.   ROS  Ten systems reviewed and is negative except as mentioned in HPI     Objective Physical Exam Constitutional: Patient appears well-developed and well-nourished. Obese  No distress.  HEENT: head atraumatic, normocephalic, pupils equal and reactive to light, neck supple Cardiovascular: Normal rate, regular rhythm and normal heart sounds.  No murmur heard. No BLE edema. Pulmonary/Chest: Effort normal and breath sounds normal. No respiratory distress. Abdominal: Soft.  There is no tenderness.  Psychiatric: Patient has a normal mood and affect. behavior is normal. Judgment and thought content normal.     Vitals:   02/07/24 0915  BP: 120/76  Pulse: 73  Resp: 16  SpO2: 95%  Weight: 175 lb 3.2 oz (79.5 kg)  Height: 4\' 11"  (1.499 m)    Body mass index is 35.39 kg/m.  Recent Results (from the past 2160 hours)  Lipase, blood     Status: Abnormal   Collection Time: 12/11/23 12:55 PM  Result Value Ref Range   Lipase 186 (H) 11 - 51 U/L    Comment: Performed at [facility], [address] [place], [place] [address]  Comprehensive metabolic panel     Status: Abnormal   Collection Time: 12/11/23 12:55 PM  Result Value Ref Range   Sodium 138 135 - 145 mmol/L   Potassium 4.8 3.5 - 5.1 mmol/L   Chloride 103 98 - 111 mmol/L   CO2 25 22 - 32 mmol/L   Glucose, Bld 483 (H) 70 - 99 mg/dL    Comment: Glucose reference range applies only to samples taken after fasting for at least 8 hours.   BUN 31 (H) 6 - 20 mg/dL   Creatinine, Ser 8.29 (H) 0.44 - 1.00 mg/dL   Calcium  8.9 8.9 - 10.3 mg/dL   Total Protein 5.9 (L) 6.5 - 8.1 g/dL   Albumin 3.4 (L) 3.5 - 5.0 g/dL   AST 31 15 - 41 U/L   ALT 25 0 - 44 U/L   Alkaline Phosphatase 40 38 - 126 U/L   Total Bilirubin 0.7 0.0 - 1.2 mg/dL   GFR, Estimated 55 (L) >60 mL/min    Comment: (NOTE) Calculated using the CKD-EPI Creatinine Equation (2021)    Anion gap 10 5 - 15    Comment: Performed at [facility], [address] [place], [place] [address]  CBC     Status: Abnormal   Collection Time:  12/11/23 12:55 PM  Result Value Ref Range   WBC 4.7 4.0 - 10.5 K/uL   RBC 4.02 3.87 - 5.11 MIL/uL   Hemoglobin 11.7 (L) 12.0 - 15.0 g/dL   HCT 08.6 57.8 - 46.9 %   MCV 92.5 80.0 - 100.0 fL   MCH 29.1 26.0 - 34.0 pg   MCHC 31.5 30.0 - 36.0 g/dL   RDW 62.9 52.8 - 41.3 %   Platelets 195 150 - 400 K/uL   nRBC 0.0 0.0 - 0.2 %    Comment: Performed at [facility], [address] [place], [place] [address]  Urinalysis, Routine w reflex microscopic -Urine, Clean Catch     Status: Abnormal   Collection Time: 12/11/23  1:16 PM  Result Value Ref Range   Color, Urine STRAW (A) YELLOW   APPearance CLEAR (A) CLEAR   Specific Gravity, Urine 1.024 1.005 - 1.030   pH 5.0 5.0 - 8.0   Glucose, UA >=500 (A) NEGATIVE mg/dL   Hgb urine dipstick NEGATIVE NEGATIVE   Bilirubin Urine NEGATIVE NEGATIVE   Ketones, ur NEGATIVE NEGATIVE mg/dL   Protein, ur NEGATIVE NEGATIVE mg/dL   Nitrite NEGATIVE NEGATIVE   Leukocytes,Ua NEGATIVE NEGATIVE   RBC / HPF 0-5 0 - 5 RBC/hpf   WBC, UA 0-5 0 - 5 WBC/hpf   Bacteria, UA NONE SEEN NONE SEEN   Squamous Epithelial / HPF 0-5 0 - 5 /HPF    Comment: Performed at [facility], [address] [place], [place] [address]  CBC with Differential/Platelet  Status: Abnormal   Collection Time: 02/04/24 11:50 AM  Result Value Ref Range   WBC 8.0 3.8 - 10.8 Thousand/uL   RBC 4.38 3.80 - 5.10 Million/uL   Hemoglobin 12.0 11.7 - 15.5 g/dL   HCT 16.1 09.6 - 04.5 %   MCV 89.3 80.0 - 100.0 fL   MCH 27.4 27.0 - 33.0 pg   MCHC 30.7 (L) 32.0 - 36.0 g/dL    Comment: For adults, a slight decrease in the calculated MCHC value (in the range of 30 to 32 g/dL) is most likely not clinically significant; however, it should be interpreted with caution in correlation with other red cell parameters and the patient's clinical condition.    RDW 11.8 11.0 - 15.0 %   Platelets 267 140 - 400 Thousand/uL   MPV 9.8 7.5 - 12.5 fL   Neutro Abs 4,936 1,500 - 7,800  cells/uL   Absolute Lymphocytes 2,296 850 - 3,900 cells/uL   Absolute Monocytes 568 200 - 950 cells/uL   Eosinophils Absolute 128 15 - 500 cells/uL   Basophils Absolute 72 0 - 200 cells/uL   Neutrophils Relative % 61.7 %   Total Lymphocyte 28.7 %   Monocytes Relative 7.1 %   Eosinophils Relative 1.6 %   Basophils Relative 0.9 %   Smear Review      Comment: Review of peripheral smear confirms automated results.   Comprehensive metabolic panel with GFR     Status: Abnormal   Collection Time: 02/04/24 11:50 AM  Result Value Ref Range   Glucose, Bld 399 (H) 65 - 99 mg/dL    Comment: Verified by repeat analysis. Aaron Aas .            Fasting reference interval . For someone without known diabetes, a glucose value >125 mg/dL indicates that they may have diabetes and this should be confirmed with a follow-up test. .    BUN 43 (H) 7 - 25 mg/dL   Creat 4.09 (H) 8.11 - 1.05 mg/dL   eGFR 44 (L) > OR = 60 mL/min/1.58m2   BUN/Creatinine Ratio 31 (H) 6 - 22 (calc)   Sodium 138 135 - 146 mmol/L   Potassium 5.5 (H) 3.5 - 5.3 mmol/L   Chloride 102 98 - 110 mmol/L   CO2 27 20 - 32 mmol/L   Calcium  9.1 8.6 - 10.4 mg/dL   Total Protein 5.8 (L) 6.1 - 8.1 g/dL   Albumin 3.8 3.6 - 5.1 g/dL   Globulin 2.0 1.9 - 3.7 g/dL (calc)   AG Ratio 1.9 1.0 - 2.5 (calc)   Total Bilirubin 0.2 0.2 - 1.2 mg/dL   Alkaline phosphatase (APISO) 51 37 - 153 U/L   AST 23 10 - 35 U/L   ALT 17 6 - 29 U/L  Sedimentation rate     Status: None   Collection Time: 02/04/24 11:50 AM  Result Value Ref Range   Sed Rate 9 0 - 30 mm/h  TSH     Status: Abnormal   Collection Time: 02/04/24 11:50 AM  Result Value Ref Range   TSH 7.78 (H) 0.40 - 4.50 mIU/L  C-reactive protein     Status: Abnormal   Collection Time: 02/04/24 11:50 AM  Result Value Ref Range   CRP 10.9 (H) <8.0 mg/L  POCT urinalysis dipstick     Status: Abnormal   Collection Time: 02/04/24 12:05 PM  Result Value Ref Range   Color, UA Yellow    Clarity, UA  Clear    Glucose, UA  Positive (A) Negative   Bilirubin, UA Negative    Ketones, UA Negative    Spec Grav, UA 1.020 1.010 - 1.025   Blood, UA Negative    pH, UA 5.0 5.0 - 8.0   Protein, UA Negative Negative   Urobilinogen, UA 0.2 (A) 0.2 or 1.0 E.U./dL   Nitrite, UA Negative    Leukocytes, UA Negative Negative   Appearance yellow    Odor none   Urine Culture     Status: None   Collection Time: 02/04/24 12:13 PM   Specimen: Urine  Result Value Ref Range   MICRO NUMBER: [id number]    SPECIMEN QUALITY: Adequate    Sample Source URINE    STATUS: FINAL    Result: No Growth   POCT glycosylated hemoglobin (Hb A1C)     Status: Abnormal   Collection Time: 02/07/24  9:20 AM  Result Value Ref Range   Hemoglobin A1C 10.8 (A) 4.0 - 5.6 %   HbA1c POC (<> result, manual entry)     HbA1c, POC (prediabetic range)     HbA1c, POC (controlled diabetic range)      Diabetic Foot Exam: \  Diabetic Foot Exam - Simple   Simple Foot Form Visual Inspection No deformities, no ulcerations, no other skin breakdown bilaterally: Yes Sensation Testing Intact to touch and monofilament testing bilaterally: Yes Pulse Check Posterior Tibialis and Dorsalis pulse intact bilaterally: Yes Comments     PHQ2/9:    02/07/2024    9:04 AM 02/04/2024   11:04 AM 10/01/2023    9:37 AM 06/20/2023   12:26 PM 05/16/2023   11:02 AM  Depression screen PHQ 2/9  Decreased Interest 0 0 0 2 2  Down, Depressed, Hopeless 0 0 0 1 1  PHQ - 2 Score 0 0 0 3 3  Altered sleeping 0 0 0 0 0  Tired, decreased energy 0 0 0 0 0  Change in appetite 0 0 0 1 1  Feeling bad or failure about yourself  0 0 0 0 0  Trouble concentrating 0 0 0 3 2  Moving slowly or fidgety/restless 0 0 0 0 0  Suicidal thoughts 0 0 0 0 0  PHQ-9 Score 0 0 0 7 6  Difficult doing work/chores Not difficult at all Not difficult at all Not difficult at all Somewhat difficult     phq 9 is negative  Fall Risk:    02/07/2024    9:04 AM 02/04/2024   11:04 AM  10/01/2023    9:37 AM 06/20/2023   12:26 PM 05/16/2023   10:58 AM  Fall Risk   Falls in the past year? 0 0 0 1 1  Number falls in past yr: 0 0 0 1 1  Injury with Fall? 0 0 0 0 0  Risk for fall due to : No Fall Risks No Fall Risks No Fall Risks History of fall(s) Impaired balance/gait  Follow up Falls prevention discussed;Education provided;Falls evaluation completed Falls prevention discussed;Education provided;Falls evaluation completed Falls prevention discussed;Education provided;Falls evaluation completed Follow up appointment;Falls prevention discussed;Education provided;Falls evaluation completed Falls prevention discussed     Assessment & Plan Uncontrolled Type 2 Diabetes Mellitus with Complications Diabetes poorly controlled with A1c of 10.8. Insulin  dependence with complications: dyslipidemia, coronary artery disease, chronic kidney disease. Non-compliance noted. Endocrinologist recommended Trulicity , Jardiance , Toujeo . - Start Trulicity  1.5 mg, Jardiance  25 mg, Toujeo  80 units daily. - Contact endocrinologist for follow-up and medication management. - Educated on medication adherence and regular follow-ups. - Encouraged  lifestyle modifications: increased water intake, reduced sweet beverages, balanced diet with low carbohydrates.  Chronic Kidney Disease stage IIIb Chronic kidney disease likely secondary to diabetes. Nephrologist follow-up recommended. - Schedule appointment with nephrologist for regular follow-up.  Dyslipidemia Dyslipidemia associated with diabetes and atherosclerosis of Aorta. Not taking atorvastatin  as prescribed. Management crucial to reduce cardiovascular risk. - Prescribe atorvastatin  80 mg, ezetimibe  10 mg , fenofibrate . - Pick up medications from pharmacy.  Coronary Artery Disease with Angina Coronary artery disease with angina. Currently taking metoprolol . No chest pain reported. - Continue metoprolol  as prescribed. - Ensure nitroglycerin  is available  for angina management.  Hypothyroidism Hypothyroidism with non-compliance to levothyroxine . Thyroid  function previously out of range. Restarting hormone replacement at lower dose. - Prescribe levothyroxine  50 mcg daily. - Recheck thyroid  levels in 6 weeks. - Bring all medications to next appointment for reconciliation.  Schizoaffective Disorder, Bipolar Type Stable on current regimen of quetiapine , lamotrigine , gabapentin , citalopram . - Continue current psychiatric medications as prescribed.

## 2024-02-07 NOTE — Patient Instructions (Signed)
 Pick up Trulicity  1.5 mg Jardiance  25 mg Toujeo  and take 80 units daily as recommended by Endo

## 2024-02-12 DIAGNOSIS — E039 Hypothyroidism, unspecified: Secondary | ICD-10-CM | POA: Diagnosis not present

## 2024-02-12 DIAGNOSIS — N1831 Chronic kidney disease, stage 3a: Secondary | ICD-10-CM | POA: Diagnosis not present

## 2024-02-12 DIAGNOSIS — E1165 Type 2 diabetes mellitus with hyperglycemia: Secondary | ICD-10-CM | POA: Diagnosis not present

## 2024-03-10 ENCOUNTER — Ambulatory Visit: Payer: Self-pay | Admitting: Urology

## 2024-03-14 ENCOUNTER — Encounter: Payer: Self-pay | Admitting: Urology

## 2024-03-26 ENCOUNTER — Ambulatory Visit: Admitting: Family Medicine

## 2024-04-02 ENCOUNTER — Ambulatory Visit: Payer: Self-pay

## 2024-04-02 NOTE — Telephone Encounter (Signed)
 FYI Only or Action Required?: FYI only for provider.  Patient was last seen in primary care on 02/07/2024 by Glenard Mire, MD.  Called Nurse Triage reporting Sinusitis, Otalgia, and Sore Throat.  Symptoms began several days ago.  Interventions attempted: OTC medications: Flonase .  Symptoms are: sinus pain and congestion, post nasal drip, bilateral earaches, sore throat, hoarse voice, chest congestion and hurts when breathing  (denies SOB) unchanged.  Triage Disposition: See Physician Within 24 Hours  Patient/caregiver understands and will follow disposition?: Yes            Copied from CRM [id number]. Topic: Clinical - Red Word Triage >> Apr 02, 2024  8:55 AM Donna BRAVO wrote: Red Word that prompted transfer to Nurse Triage: patient has ear, throat pain, sinus pressure, not coughing up anything, hurts to breathe Reason for Disposition  Earache  Answer Assessment - Initial Assessment Questions Patient states she is also out of her headache and dizziness medications, unable to determine the name of the medications. Advised patient to bring list with her tomorrow for appt.  1. LOCATION: Where does it hurt?      Behind eyes.  2. ONSET: When did the sinus pain start?  (e.g., hours, days)      Couple of days ago.  3. SEVERITY: How bad is the pain?   (Scale 0-10; or none, mild, moderate or severe)     9/10.  4. RECURRENT SYMPTOM: Have you ever had sinus problems before? If Yes, ask: When was the last time? and What happened that time?      Yes, she states she has a history of sinus infections but unsure of last time.  5. NASAL CONGESTION: Is the nose blocked? If Yes, ask: Can you open it or must you breathe through your mouth?     She states she is able to breathe through her nose.  6. NASAL DISCHARGE: Do you have discharge from your nose? If so ask, What color?     No color.  7. FEVER: Do you have a fever? If Yes, ask: What is it, how was it  measured, and when did it start?      No.  8. OTHER SYMPTOMS: Do you have any other symptoms? (e.g., sore throat, cough, earache, difficulty breathing)     Hoarse voice, sore throat, chest congested hurts when I breathe, bilateral earaches, post nasal drip . Patient states she is okay when asked if any difficulty breathing or SOB.  9. PREGNANCY: Is there any chance you are pregnant? When was your last menstrual period?     N/A.  Protocols used: Sinus Pain or Congestion-A-AH

## 2024-04-03 ENCOUNTER — Encounter: Payer: Self-pay | Admitting: Family Medicine

## 2024-04-03 ENCOUNTER — Ambulatory Visit: Admitting: Family Medicine

## 2024-04-03 ENCOUNTER — Other Ambulatory Visit: Payer: Self-pay

## 2024-04-03 VITALS — BP 106/64 | HR 88 | Resp 16 | Ht 59.0 in | Wt 180.5 lb

## 2024-04-03 DIAGNOSIS — R0981 Nasal congestion: Secondary | ICD-10-CM | POA: Diagnosis not present

## 2024-04-03 DIAGNOSIS — U071 COVID-19: Secondary | ICD-10-CM

## 2024-04-03 DIAGNOSIS — J029 Acute pharyngitis, unspecified: Secondary | ICD-10-CM

## 2024-04-03 DIAGNOSIS — I25118 Atherosclerotic heart disease of native coronary artery with other forms of angina pectoris: Secondary | ICD-10-CM

## 2024-04-03 DIAGNOSIS — J410 Simple chronic bronchitis: Secondary | ICD-10-CM

## 2024-04-03 DIAGNOSIS — N1831 Chronic kidney disease, stage 3a: Secondary | ICD-10-CM | POA: Diagnosis not present

## 2024-04-03 LAB — POCT RAPID STREP A (OFFICE): Rapid Strep A Screen: NEGATIVE

## 2024-04-03 LAB — POC COVID19/FLU A&B COMBO
Covid Antigen, POC: POSITIVE — AB
Influenza A Antigen, POC: NEGATIVE
Influenza B Antigen, POC: NEGATIVE

## 2024-04-03 MED ORDER — BENZONATATE 100 MG PO CAPS: 100.0000 mg | ORAL_CAPSULE | Freq: Three times a day (TID) | ORAL | 0 refills | Status: DC | PRN

## 2024-04-03 MED ORDER — PAXLOVID (150/100) 10 X 150 MG & 10 X 100MG PO TBPK
2.0000 | ORAL_TABLET | Freq: Two times a day (BID) | ORAL | 0 refills | Status: AC
  Filled 2024-04-03: qty 20, 5d supply, fill #0

## 2024-04-03 NOTE — Progress Notes (Signed)
 Name: [name]   MRN: [id number]    DOB: 01-Jun-1966   Date:04/03/2024       Progress Note  Subjective  Chief Complaint  Chief Complaint  Patient presents with   Nasal Congestion   Sinusitis   Sore Throat   Ear Pain    Bilateral ears, throat pain, sinus pressure, not coughing up anything, hurts to breathe, on going for several days    Discussed the use of AI scribe software for clinical note transcription with the patient, who gave verbal consent to proceed.  History of Present Illness [name] is a 58 year old female with obesity, chronic bronchitis, chronic kidney disease, and coronary artery disease who presents with symptoms of COVID-19.  She has been experiencing worsening symptoms over the past three days, including a severe headache with visual pressure and pain in the head. She also has a dry cough that leads to gagging.  She reports stomach pain, which she believes is due to nasal drainage. No fever or chills are present.  She is not currently using any over-the-counter medications. Her medical history includes obesity, simple chronic bronchitis, chronic kidney disease, and coronary artery disease. She is taking Seroquel  at a dose of 300 mg daily.    Patient Active Problem List   Diagnosis Date Noted   Adult hypothyroidism 06/20/2023   Class 2 severe obesity with serious comorbidity and body mass index (BMI) of 35.0 to 35.9 in adult [facility]) 06/19/2023   Vertigo 02/10/2022   TIA (transient ischemic attack) 10/04/2021   HTN (hypertension) 10/04/2021   Chronic kidney disease, stage 3a (HCC) 10/04/2021   COPD (chronic obstructive pulmonary disease) (HCC) 10/04/2021   Chronic diastolic CHF (congestive heart failure) (HCC) 10/04/2021   Atherosclerosis of aorta (HCC) 03/28/2021   Iron deficiency anemia due to chronic blood loss    Polyp of ascending colon    Vitamin B12 deficiency 08/08/2018   Patient is full code 10/30/2017   Coronary artery disease of native  artery of native heart with stable angina pectoris (HCC) 10/09/2017   Plantar fascial fibromatosis 10/09/2017   Tendinitis of wrist 10/09/2017   Schizoaffective disorder, bipolar type (HCC) 09/28/2017   Adenomatous colon polyp 10/15/2015   Bipolar affective disorder (HCC) 10/15/2015   Constipation 10/15/2015   NASH (nonalcoholic steatohepatitis) 10/15/2015   Low back pain with left-sided sciatica 08/18/2015   Type 2 diabetes with complication (HCC) 05/11/2015   Apnea, sleep 05/05/2015   Heart valve disease 05/05/2015   CKD stage 3 due to type 2 diabetes mellitus (HCC) 03/15/2015   Migraine 03/12/2015   Chronic bronchitis (HCC) 01/20/2015   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HCC) 01/20/2015   GERD (gastroesophageal reflux disease) 01/20/2015   Vitamin D  deficiency    Urinary incontinence    Liver lesion    Obstructive sleep apnea 10/20/2013   Hereditary and idiopathic peripheral neuropathy 03/04/2009   Hemorrhoids, external 09/03/2008   Allergic rhinitis 07/04/2007   Interstitial cystitis 07/04/2007    Social History   Tobacco Use   Smoking status: Never   Smokeless tobacco: Never  Substance Use Topics   Alcohol use: No     Current Outpatient Medications:    atorvastatin  (LIPITOR ) 80 MG tablet, Take 1 tablet (80 mg total) by mouth at bedtime., Disp: 90 tablet, Rfl: 1   benzonatate  (TESSALON ) 100 MG capsule, Take 1-2 capsules (100-200 mg total) by mouth 3 (three) times daily as needed., Disp: 40 capsule, Rfl: 0   busPIRone  (BUSPAR ) 30 MG tablet, Take  30 mg by mouth 2 (two) times daily., Disp: , Rfl:    citalopram  (CELEXA ) 20 MG tablet, Take by mouth., Disp: , Rfl:    Dulaglutide  (TRULICITY ) 1.5 MG/0.5ML SOAJ, Inject into the skin., Disp: , Rfl:    empagliflozin  (JARDIANCE ) 25 MG TABS tablet, Take 25 mg by mouth daily., Disp: , Rfl:    ezetimibe  (ZETIA ) 10 MG tablet, Take 1 tablet (10 mg total) by mouth daily., Disp: 90 tablet, Rfl: 1   fenofibrate  micronized (LOFIBRA) 134 MG  capsule, Take 1 capsule (134 mg total) by mouth daily., Disp: 90 capsule, Rfl: 1   fluconazole  (DIFLUCAN ) 150 MG tablet, Take 1 tablet (150 mg total) by mouth every other day., Disp: 3 tablet, Rfl: 0   gabapentin  (NEURONTIN ) 600 MG tablet, Take 600 mg by mouth at bedtime., Disp: , Rfl:    hydrOXYzine  (ATARAX ) 50 MG tablet, Take 50 mg by mouth 3 (three) times daily., Disp: , Rfl:    lamoTRIgine  (LAMICTAL ) 200 MG tablet, Take 200 mg by mouth 2 (two) times daily. , Disp: , Rfl:    levothyroxine  (SYNTHROID ) 50 MCG tablet, Take 1 tablet (50 mcg total) by mouth daily., Disp: 30 tablet, Rfl: 1   metoprolol  tartrate (LOPRESSOR ) 25 MG tablet, TAKE 1 TABLET BY MOUTH TWICE DAILY, Disp: 90 tablet, Rfl: 3   nirmatrelvir /ritonavir , renal dosing, (PAXLOVID , 150/100,) 10 x 150 MG & 10 x 100MG  TBPK, Take 2 tablets by mouth 2 (two) times daily for 5 days. Dosage for moderate renal impairment (eGFR >/= 30 to <60 mL/min): 150 mg nirmatrelvir  (one 150 mg tablet) with 100 mg ritonavir  (one 100 mg tablet), with both tablets taken together twice daily for 5 days. Not recommended if eGFR < 30 mL/min.  PAXLOVID  is not recommend in patients with severe hepatic impairment (Child-Pugh Class C)., Disp: 20 tablet, Rfl: 0   ONETOUCH VERIO test strip, , Disp: , Rfl:    QUEtiapine  (SEROQUEL ) 300 MG tablet, Take 300 mg by mouth at bedtime., Disp: , Rfl:    Multiple Vitamin (MULTI-VITAMIN) tablet, Take 1 tablet by mouth daily. (Patient not taking: Reported on 04/03/2024), Disp: , Rfl:    nitroGLYCERIN  (NITROSTAT ) 0.4 MG SL tablet, Place 1 tablet (0.4 mg total) under the tongue every 5 (five) minutes as needed for chest pain. (Patient not taking: Reported on 04/03/2024), Disp: 25 tablet, Rfl: 3   OneTouch Delica Lancets 30G MISC, by Does not apply route. (Patient not taking: Reported on 04/03/2024), Disp: , Rfl:   Allergies  Allergen Reactions   Mirtazapine Anaphylaxis    closes throat   Morphine And Codeine Anaphylaxis    Sulfamethoxazole-Trimethoprim Rash    Whelps all over   Amoxicillin  Er Nausea And Vomiting   Codeine Itching and Rash   Hydrocodone -Acetaminophen  Rash   Keflex  [Cephalexin ] Rash    Pt does not remember having a reaction or rash, no airway involvement   Prasterone  Nausea And Vomiting    ROS  Ten systems reviewed and is negative except as mentioned in HPI    Objective  Vitals:   04/03/24 0951  BP: 106/64  Pulse: 88  Resp: 16  SpO2: 94%  Weight: 180 lb 8 oz (81.9 kg)  Height: 4' 11 (1.499 m)    Body mass index is 36.46 kg/m.    Physical Exam CONSTITUTIONAL: Patient appears well-developed and well-nourished.  No distress. HEENT: Head atraumatic, normocephalic, neck supple. CARDIOVASCULAR: Normal rate, regular rhythm and normal heart sounds.  No murmur heard. No BLE edema. PULMONARY: Effort normal  and breath sounds normal. No respiratory distress. ABDOMINAL: There is no tenderness or distention. MUSCULOSKELETAL: Normal gait. Without gross motor or sensory deficit. PSYCHIATRIC: Patient has a normal mood and affect. behavior is normal. Judgment and thought content normal.  Recent Results (from the past 2160 hours)  CBC with Differential/Platelet     Status: Abnormal   Collection Time: 02/04/24 11:50 AM  Result Value Ref Range   WBC 8.0 3.8 - 10.8 Thousand/uL   RBC 4.38 3.80 - 5.10 Million/uL   Hemoglobin 12.0 11.7 - 15.5 g/dL   HCT 60.8 64.9 - 54.9 %   MCV 89.3 80.0 - 100.0 fL   MCH 27.4 27.0 - 33.0 pg   MCHC 30.7 (L) 32.0 - 36.0 g/dL    Comment: For adults, a slight decrease in the calculated MCHC value (in the range of 30 to 32 g/dL) is most likely not clinically significant; however, it should be interpreted with caution in correlation with other red cell parameters and the patient's clinical condition.    RDW 11.8 11.0 - 15.0 %   Platelets 267 140 - 400 Thousand/uL   MPV 9.8 7.5 - 12.5 fL   Neutro Abs 4,936 1,500 - 7,800 cells/uL   Absolute Lymphocytes  2,296 850 - 3,900 cells/uL   Absolute Monocytes 568 200 - 950 cells/uL   Eosinophils Absolute 128 15 - 500 cells/uL   Basophils Absolute 72 0 - 200 cells/uL   Neutrophils Relative % 61.7 %   Total Lymphocyte 28.7 %   Monocytes Relative 7.1 %   Eosinophils Relative 1.6 %   Basophils Relative 0.9 %   Smear Review      Comment: Review of peripheral smear confirms automated results.   Comprehensive metabolic panel with GFR     Status: Abnormal   Collection Time: 02/04/24 11:50 AM  Result Value Ref Range   Glucose, Bld 399 (H) 65 - 99 mg/dL    Comment: Verified by repeat analysis. SABRA .            Fasting reference interval . For someone without known diabetes, a glucose value >125 mg/dL indicates that they may have diabetes and this should be confirmed with a follow-up test. .    BUN 43 (H) 7 - 25 mg/dL   Creat 8.61 (H) 9.49 - 1.05 mg/dL   eGFR 44 (L) > OR = 60 mL/min/1.73m2   BUN/Creatinine Ratio 31 (H) 6 - 22 (calc)   Sodium 138 135 - 146 mmol/L   Potassium 5.5 (H) 3.5 - 5.3 mmol/L   Chloride 102 98 - 110 mmol/L   CO2 27 20 - 32 mmol/L   Calcium  9.1 8.6 - 10.4 mg/dL   Total Protein 5.8 (L) 6.1 - 8.1 g/dL   Albumin 3.8 3.6 - 5.1 g/dL   Globulin 2.0 1.9 - 3.7 g/dL (calc)   AG Ratio 1.9 1.0 - 2.5 (calc)   Total Bilirubin 0.2 0.2 - 1.2 mg/dL   Alkaline phosphatase (APISO) 51 37 - 153 U/L   AST 23 10 - 35 U/L   ALT 17 6 - 29 U/L  Sedimentation rate     Status: None   Collection Time: 02/04/24 11:50 AM  Result Value Ref Range   Sed Rate 9 0 - 30 mm/h  TSH     Status: Abnormal   Collection Time: 02/04/24 11:50 AM  Result Value Ref Range   TSH 7.78 (H) 0.40 - 4.50 mIU/L  C-reactive protein     Status: Abnormal   Collection  Time: 02/04/24 11:50 AM  Result Value Ref Range   CRP 10.9 (H) <8.0 mg/L  POCT urinalysis dipstick     Status: Abnormal   Collection Time: 02/04/24 12:05 PM  Result Value Ref Range   Color, UA Yellow    Clarity, UA Clear    Glucose, UA Positive (A)  Negative   Bilirubin, UA Negative    Ketones, UA Negative    Spec Grav, UA 1.020 1.010 - 1.025   Blood, UA Negative    pH, UA 5.0 5.0 - 8.0   Protein, UA Negative Negative   Urobilinogen, UA 0.2 (A) 0.2 or 1.0 E.U./dL   Nitrite, UA Negative    Leukocytes, UA Negative Negative   Appearance yellow    Odor none   Urine Culture     Status: None   Collection Time: 02/04/24 12:13 PM   Specimen: Urine  Result Value Ref Range   MICRO NUMBER: [id number]    SPECIMEN QUALITY: Adequate    Sample Source URINE    STATUS: FINAL    Result: No Growth   POCT glycosylated hemoglobin (Hb A1C)     Status: Abnormal   Collection Time: 02/07/24  9:20 AM  Result Value Ref Range   Hemoglobin A1C 10.8 (A) 4.0 - 5.6 %   HbA1c POC (<> result, manual entry)     HbA1c, POC (prediabetic range)     HbA1c, POC (controlled diabetic range)    POCT rapid strep A     Status: None   Collection Time: 04/03/24  9:59 AM  Result Value Ref Range   Rapid Strep A Screen Negative Negative  POC Covid19/Flu A&B Antigen     Status: Abnormal   Collection Time: 04/03/24  9:59 AM  Result Value Ref Range   Influenza A Antigen, POC Negative Negative   Influenza B Antigen, POC Negative Negative   Covid Antigen, POC Positive (A) Negative     Assessment & Plan COVID-19 infection in a patient with obesity, simple chronic bronchitis, chronic kidney disease stage 3a, and coronary artery disease Acute COVID-19 infection confirmed. Risk of complications due to comorbidities. Discussed Paxlovid  treatment to decrease symptoms and risk of complications. Advised to avoid contact with daughter due to high transmission risk. - Prescribe Paxlovid  with dose adjustment based on GFR of 44. - Advise monitoring for shortness of breath, increasing fatigue, and dehydration. - Instruct to go to the emergency room if symptoms worsen. - Advise to avoid contact with daughter for at least ten days. - Recommend taking Mucinex  for cough. - Encourage  rest and fluid intake.  Chronic kidney disease stage 3a Chronic kidney disease stage 3a with a GFR of 44. Dose adjustment for Paxlovid  is necessary due to reduced kidney function. - Adjust Paxlovid  dose based on GFR of 44.  Morbid obesity BMI over 35 with CKI and CAD Obesity is a comorbidity that increases the risk of complications from COVID-19.  Simple chronic bronchitis Chronic bronchitis may exacerbate respiratory symptoms during COVID-19 infection.  Coronary artery disease Coronary artery disease increases the risk of complications from COVID-19. Advised to avoid exposure to prevent exacerbation of cardiac condition.  Major depressive disorder Currently on Seroquel  300 mg. Risk of increased toxicity with Paxlovid . Advised to contact psychiatrist for dose adjustment during Paxlovid  treatment. - Instruct to reduce Seroquel  dose from 300 mg to 150 mg during Paxlovid  treatment. - Advise to contact psychiatrist for further guidance.

## 2024-04-03 NOTE — Telephone Encounter (Unsigned)
 Copied from CRM [id number]. Topic: Clinical - Prescription Issue >> Apr 03, 2024 12:49 PM Zebedee SAUNDERS wrote: Reason for CRM: Pt stated pharmacy [facility] - [place], [place] -  [address] [place] [address] Phone: [phone] Fax: [phone] does not have PAXLOVID  available. Please redirect to other pharmacy. Pt would like a call back at [phone].

## 2024-04-07 ENCOUNTER — Ambulatory Visit: Payer: Self-pay

## 2024-04-07 NOTE — Telephone Encounter (Signed)
 Copied from CRM [id number]. Topic: Clinical - Red Word Triage >> Apr 07, 2024 12:16 PM Wess RAMAN wrote: Red Word that prompted transfer to Nurse Triage: currently taking nirmatrelvir /ritonavir , renal dosing, (PAXLOVID , 150/100,) 10 x 150 MG & 10 x 100MG  TBPK for Covid. Difficulty breathing when laying down or sitting due to congestion  Patient requesting more tessalon  medicine, per difficulty breathing she should rather be seen?

## 2024-04-07 NOTE — Telephone Encounter (Signed)
 FYI Only or Action Required?: FYI only for provider.  Patient was last seen in primary care on 04/03/2024 by Glenard Mire, MD.  Called Nurse Triage reporting No chief complaint on file..  Symptoms began yesterday.  Interventions attempted: Prescription medications: Benzontate.  Symptoms are: gradually worsening.  Triage Disposition: See PCP When Office is Open (Within 3 Days)  Patient/caregiver understands and will follow disposition?: Yes  Copied from CRM [id number]. Topic: Clinical - Red Word Triage >> Apr 07, 2024 12:16 PM Wess RAMAN wrote: Red Word that prompted transfer to Nurse Triage: currently taking nirmatrelvir /ritonavir , renal dosing, (PAXLOVID , 150/100,) 10 x 150 MG & 10 x 100MG  TBPK for Covid. Difficulty breathing when laying down or sitting due to congestion Reason for Disposition  [1] Taking antibiotic > 7 days AND [2] nasal discharge not improved  Answer Assessment - Initial Assessment Questions 1. LOCATION: Where does it hurt?      Denies  2. ONSET: When did the sinus pain start?  (e.g., hours, days)      Since Last Night  3. SEVERITY: How bad is the pain?   (Scale 0-10; or none, mild, moderate or severe)     Denies pain  4. RECURRENT SYMPTOM: Have you ever had sinus problems before? If Yes, ask: When was the last time? and What happened that time?      Yes  5. NASAL CONGESTION: Is the nose blocked? If Yes, ask: Can you open it or must you breathe through your mouth?     Yes  6. NASAL DISCHARGE: Do you have discharge from your nose? If so ask, What color?     Yes, not really colored  7. FEVER: Do you have a fever? If Yes, ask: What is it, how was it measured, and when did it start?      No  8. OTHER SYMPTOMS: Do you have any other symptoms? (e.g., sore throat, cough, earache, difficulty breathing)      Congestion   9. PREGNANCY: Is there any chance you are pregnant? When was your last menstrual period?     No and  No  Answer Assessment - Initial Assessment Questions Please contact patient at [phone], as the patient is requesting medication and clinical advice.  Protocols used: Sinus Pain or Congestion-A-AH, Sinus Infection on Antibiotic Follow-up Call-A-AH

## 2024-04-07 NOTE — Telephone Encounter (Signed)
 Spoke with pt and she verbalized understanding.

## 2024-04-10 ENCOUNTER — Other Ambulatory Visit: Payer: Self-pay

## 2024-04-10 ENCOUNTER — Emergency Department

## 2024-04-10 ENCOUNTER — Encounter: Payer: Self-pay | Admitting: Emergency Medicine

## 2024-04-10 ENCOUNTER — Emergency Department
Admission: EM | Admit: 2024-04-10 | Discharge: 2024-04-10 | Disposition: A | Discharge status: Home / Self Care | Attending: Emergency Medicine | Admitting: Emergency Medicine

## 2024-04-10 DIAGNOSIS — J449 Chronic obstructive pulmonary disease, unspecified: Secondary | ICD-10-CM | POA: Insufficient documentation

## 2024-04-10 DIAGNOSIS — I509 Heart failure, unspecified: Secondary | ICD-10-CM | POA: Diagnosis not present

## 2024-04-10 DIAGNOSIS — R059 Cough, unspecified: Secondary | ICD-10-CM | POA: Diagnosis not present

## 2024-04-10 DIAGNOSIS — J069 Acute upper respiratory infection, unspecified: Secondary | ICD-10-CM | POA: Diagnosis not present

## 2024-04-10 DIAGNOSIS — R0602 Shortness of breath: Secondary | ICD-10-CM | POA: Diagnosis not present

## 2024-04-10 DIAGNOSIS — B9789 Other viral agents as the cause of diseases classified elsewhere: Secondary | ICD-10-CM | POA: Diagnosis not present

## 2024-04-10 DIAGNOSIS — R918 Other nonspecific abnormal finding of lung field: Secondary | ICD-10-CM | POA: Diagnosis not present

## 2024-04-10 DIAGNOSIS — I700 Atherosclerosis of aorta: Secondary | ICD-10-CM | POA: Diagnosis not present

## 2024-04-10 LAB — BASIC METABOLIC PANEL WITH GFR
Anion gap: 9 (ref 5–15)
BUN: 36 mg/dL — ABNORMAL HIGH (ref 6–20)
CO2: 26 mmol/L (ref 22–32)
Calcium: 8.7 mg/dL — ABNORMAL LOW (ref 8.9–10.3)
Chloride: 104 mmol/L (ref 98–111)
Creatinine, Ser: 1.41 mg/dL — ABNORMAL HIGH (ref 0.44–1.00)
GFR, Estimated: 43 mL/min — ABNORMAL LOW (ref >60)
Glucose, Bld: 300 mg/dL — ABNORMAL HIGH (ref 70–99)
Potassium: 4.8 mmol/L (ref 3.5–5.1)
Sodium: 139 mmol/L (ref 135–145)

## 2024-04-10 LAB — CBC
HCT: 36.5 % (ref 36.0–46.0)
Hemoglobin: 11.1 g/dL — ABNORMAL LOW (ref 12.0–15.0)
MCH: 26.6 pg (ref 26.0–34.0)
MCHC: 30.4 g/dL (ref 30.0–36.0)
MCV: 87.3 fL (ref 80.0–100.0)
Platelets: 312 K/uL (ref 150–400)
RBC: 4.18 MIL/uL (ref 3.87–5.11)
RDW: 16.9 % — ABNORMAL HIGH (ref 11.5–15.5)
WBC: 8.2 K/uL (ref 4.0–10.5)
nRBC: 0 % (ref 0.0–0.2)

## 2024-04-10 LAB — RESP PANEL BY RT-PCR (RSV, FLU A&B, COVID)  RVPGX2
Influenza A by PCR: NEGATIVE
Influenza B by PCR: NEGATIVE
Resp Syncytial Virus by PCR: NEGATIVE
SARS Coronavirus 2 by RT PCR: NEGATIVE

## 2024-04-10 LAB — TROPONIN I (HIGH SENSITIVITY): Troponin I (High Sensitivity): 5 ng/L (ref <18)

## 2024-04-10 MED ORDER — ONDANSETRON 8 MG PO TBDP: 8.0000 mg | ORAL_TABLET | Freq: Three times a day (TID) | ORAL | 0 refills | Status: DC | PRN

## 2024-04-10 MED ORDER — ONDANSETRON 4 MG PO TBDP
8.0000 mg | ORAL_TABLET | Freq: Once | ORAL | Status: AC
  Administered 2024-04-10: 8 mg via ORAL
  Filled 2024-04-10: qty 2

## 2024-04-10 MED ORDER — PREDNISONE 10 MG (21) PO TBPK: ORAL_TABLET | ORAL | 0 refills | Status: DC

## 2024-04-10 MED ORDER — IPRATROPIUM-ALBUTEROL 0.5-2.5 (3) MG/3ML IN SOLN
3.0000 mL | Freq: Once | RESPIRATORY_TRACT | Status: AC
  Administered 2024-04-10: 3 mL via RESPIRATORY_TRACT
  Filled 2024-04-10: qty 3

## 2024-04-10 MED ORDER — METHYLPREDNISOLONE SODIUM SUCC 125 MG IJ SOLR
125.0000 mg | Freq: Once | INTRAMUSCULAR | Status: AC
  Administered 2024-04-10: 125 mg via INTRAMUSCULAR
  Filled 2024-04-10: qty 2

## 2024-04-10 NOTE — ED Triage Notes (Signed)
 Pt via POV from home. Reports she was dx with COVID about a week ago, reports that she was Paxlovid  and completed that 5 days ago but thinks that the COVID is coming back. Reports cough, congestion, nausea, and diarrhea. Also reports some SOB and chest tightness. Pt has a hx of COPD and CHF. Pt is A&OX4 and NAD

## 2024-04-10 NOTE — ED Provider Notes (Signed)
 [facility] Provider Note   Event Date/Time   First MD Initiated Contact with Patient 04/10/24 1640     (approximate) History  Cough  HPI [name] is a 58 y.o. female with a stated past medical history of COPD and CHF who presents after being diagnosed with COVID 7 days ago, placed on Paxlovid  with the last dose 2 days ago and recurrence of symptoms including cough, nasal congestion, nausea/vomiting/diarrhea.  Patient states that she has had COVID 3 times in the last year and feels that this Paxlovid  has been working less and less well.  Patient states that she wears oxygen  as needed at home including at night but feels that she has not needed more than usual. ROS: Patient currently denies any vision changes, tinnitus, difficulty speaking, facial droop, sore throat, chest pain, abdominal pain, dysuria, or weakness/numbness/paresthesias in any extremity   Physical Exam  Triage Vital Signs: ED Triage Vitals  Encounter Vitals Group     BP 04/10/24 1605 (!) 144/67     Girls Systolic BP Percentile --      Girls Diastolic BP Percentile --      Boys Systolic BP Percentile --      Boys Diastolic BP Percentile --      Pulse Rate 04/10/24 1605 75     Resp 04/10/24 1605 18     Temp 04/10/24 1605 98.7 F (37.1 C)     Temp src --      SpO2 04/10/24 1605 (!) 88 %     Weight 04/10/24 1552 181 lb (82.1 kg)     Height 04/10/24 1552 4' 11 (1.499 m)     Head Circumference --      Peak Flow --      Pain Score 04/10/24 1552 0     Pain Loc --      Pain Education --      Exclude from Growth Chart --    Most recent vital signs: Vitals:   04/10/24 1605  BP: (!) 144/67  Pulse: 75  Resp: 18  Temp: 98.7 F (37.1 C)  SpO2: (!) 88%   General: Awake, oriented x4. CV:  Good peripheral perfusion. Resp:  Normal effort.  Expiratory wheezing over bilateral lung fields Abd:  No distention. Other:  Middle-aged obese Caucasian female resting comfortably in no acute  distress ED Results / Procedures / Treatments  Labs (all labs ordered are listed, but only abnormal results are displayed) Labs Reviewed  BASIC METABOLIC PANEL WITH GFR - Abnormal; Notable for the following components:      Result Value   Glucose, Bld 300 (*)    BUN 36 (*)    Creatinine, Ser 1.41 (*)    Calcium  8.7 (*)    GFR, Estimated 43 (*)    All other components within normal limits  CBC - Abnormal; Notable for the following components:   Hemoglobin 11.1 (*)    RDW 16.9 (*)    All other components within normal limits  RESP PANEL BY RT-PCR (RSV, FLU A&B, COVID)  [id number]  TROPONIN I (HIGH SENSITIVITY)   EKG ED ECG REPORT I, Artist MARLA Kerns, the attending physician, personally viewed and interpreted this ECG. Date: 04/10/2024 EKG Time: 1559 Rate: 75 Rhythm: normal sinus rhythm QRS Axis: normal Intervals: normal ST/T Wave abnormalities: normal Narrative Interpretation: no evidence of acute ischemia RADIOLOGY ED MD interpretation: 2 view chest x-ray interpreted by me shows no evidence of acute abnormalities including no pneumonia, pneumothorax, or  widened mediastinum - All radiology independently interpreted and agree with radiology assessment Official radiology report(s): CT Chest Wo Contrast Result Date: 04/10/2024 CLINICAL DATA:  History of COVID cough congestion nausea EXAM: CT CHEST WITHOUT CONTRAST TECHNIQUE: Multidetector CT imaging of the chest was performed following the standard protocol without IV contrast. RADIATION DOSE REDUCTION: This exam was performed according to the departmental dose-optimization program which includes automated exposure control, adjustment of the mA and/or kV according to patient size and/or use of iterative reconstruction technique. COMPARISON:  Chest x-ray 04/10/2024, CT chest 09/16/2020 FINDINGS: Cardiovascular: Limited evaluation without intravenous contrast. Mild aortic atherosclerosis. No aneurysm. Coronary vascular calcification. Normal  cardiac size. No pericardial effusion Mediastinum/Nodes: Patent trachea. No thyroid  mass. No suspicious lymph nodes. Esophagus within normal limits Lungs/Pleura: No acute airspace disease, pleural effusion or pneumothorax. 4 mm left upper lobe pulmonary nodule on series 3, image 25. Upper Abdomen: No acute finding Musculoskeletal: No acute osseous abnormality IMPRESSION: 1. No CT evidence for acute intrathoracic abnormality. 2. 4 mm left upper lobe pulmonary nodule. No follow-up needed if patient is low-risk.This recommendation follows the consensus statement: Guidelines for Management of Incidental Pulmonary Nodules Detected on CT Images: From the Fleischner Society 2017; Radiology 2017; 284:228-243. 3. Aortic atherosclerosis. Aortic Atherosclerosis (ICD10-I70.0). Electronically Signed   By: Luke Bun M.D.   On: 04/10/2024 19:03   DG Chest 2 View Result Date: 04/10/2024 CLINICAL DATA:  cp and cough EXAM: CHEST - 2 VIEW COMPARISON:  October 31, 2022 FINDINGS: No focal airspace consolidation, pleural effusion, or pneumothorax. No cardiomegaly. Aortic atherosclerosis. No acute fracture or destructive lesions. Multilevel thoracic osteophytosis. IMPRESSION: No acute cardiopulmonary abnormality. Electronically Signed   By: Rogelia Myers M.D.   On: 04/10/2024 16:39   PROCEDURES: Critical Care performed: No Procedures MEDICATIONS ORDERED IN ED: Medications  methylPREDNISolone  sodium succinate (SOLU-MEDROL ) 125 mg/2 mL injection 125 mg (125 mg Intramuscular Given 04/10/24 1852)  ipratropium-albuterol  (DUONEB) 0.5-2.5 (3) MG/3ML nebulizer solution 3 mL (3 mLs Nebulization Given 04/10/24 1852)  ondansetron  (ZOFRAN -ODT) disintegrating tablet 8 mg (8 mg Oral Given 04/10/24 1940)   IMPRESSION / MDM / ASSESSMENT AND PLAN / ED COURSE  I reviewed the triage vital signs and the nursing notes.                             The patient is on the cardiac monitor to evaluate for evidence of arrhythmia and/or significant  heart rate changes. Patient's presentation is most consistent with acute presentation with potential threat to life or bodily function. The patient appears to be suffering from a moderate exacerbation of COPD.  Based on the history, exam, CXR/EKG, and further workup I don't suspect any other emergent cause of this presentation, such as pneumonia, acute coronary syndrome, congestive heart failure, pulmonary embolism, or pneumothorax.  ED Interventions: bronchodilators, steroids, antibiotics, reassess  2001 Reassessment: After treatment, the patient's shortness of breath is resolved, and their lung exam has returned to baseline. They are comfortable and want to go home.  Rx: Steroids, Antibiotics, Albuterol  Disposition: Discharge home with SRP. PCP follow up recommended in next 48hours.   FINAL CLINICAL IMPRESSION(S) / ED DIAGNOSES   Final diagnoses:  Viral URI with cough   Rx / DC Orders   ED Discharge Orders          Ordered    predniSONE  (STERAPRED UNI-PAK 21 TAB) 10 MG (21) TBPK tablet        04/10/24 2009  ondansetron  (ZOFRAN -ODT) 8 MG disintegrating tablet  Every 8 hours PRN        04/10/24 2009           Note:  This document was prepared using Dragon voice recognition software and may include unintentional dictation errors.   Deyonte Cadden K, MD 04/10/24 2100

## 2024-04-16 ENCOUNTER — Ambulatory Visit: Payer: Self-pay

## 2024-04-16 NOTE — Telephone Encounter (Signed)
 Appt sch'd for tomorrow with Dr Carlynn Purl

## 2024-04-16 NOTE — Telephone Encounter (Signed)
 FYI Only or Action Required?: Action required by provider: medication refill request.  Patient was last seen in primary care on 04/03/2024 by Glenard Mire, MD.  Called Nurse Triage reporting Abrasion.  Symptoms began x couple day.  Interventions attempted: OTC medications: first aid ointment & tylenol .  Symptoms are: gradually worsening.  Triage Disposition: See Physician Within 24 Hours  Patient/caregiver understands and will follow disposition?: No, wishes to speak with PCP     Copied from CRM [id number]. Topic: Clinical - Red Word Triage >> Apr 16, 2024  8:18 AM Charlet HERO wrote: Red Word that prompted transfer to Nurse Triage: Patient is stating that she is raw above her pubic area and underneath her stomach fold she is stating that it has yellow discharge coming out. Reason for Disposition  [1] Looks infected (e.g., spreading redness, pus) AND [2] no fever  Answer Assessment - Initial Assessment Questions 1. APPEARANCE of INJURY: What does the injury look like?      Raw, warm to touch, drainage yellowish-green,  2. SIZE: How large is the scrape?      Above pubic area and underneath stomach fold 3. BLEEDING: Is it bleeding now? If Yes, ask: Is it difficult to stop?      no 4. LOCATION: Where is the injury located?      Above pubic area and underneath stomach fold  5. ONSET: How long ago did the injury occur?      X couple 6. MECHANISM: Tell me how it happened.      Skin rubbing against skin 7. TETANUS: When was your last tetanus booster?     na 8. PREGNANCY: Is there any chance you are pregnant? When was your last menstrual period?     Na  Pt is diabetic.  Area of concern is warm to touch and has yellowish green drainage.  Pt would like PCP to call something in for her to help: stated last time it was a yeast infection under her breasts and pt is thinking this is the same  Protocols used: Scrapes-A-AH

## 2024-04-16 NOTE — Telephone Encounter (Signed)
 Needs appt

## 2024-04-17 ENCOUNTER — Encounter: Payer: Self-pay | Admitting: Family Medicine

## 2024-04-17 ENCOUNTER — Ambulatory Visit (INDEPENDENT_AMBULATORY_CARE_PROVIDER_SITE_OTHER): Admitting: Family Medicine

## 2024-04-17 VITALS — BP 112/68 | HR 80 | Resp 16 | Ht 59.0 in | Wt 174.8 lb

## 2024-04-17 DIAGNOSIS — R7982 Elevated C-reactive protein (CRP): Secondary | ICD-10-CM | POA: Diagnosis not present

## 2024-04-17 DIAGNOSIS — J3089 Other allergic rhinitis: Secondary | ICD-10-CM

## 2024-04-17 DIAGNOSIS — B3731 Acute candidiasis of vulva and vagina: Secondary | ICD-10-CM

## 2024-04-17 DIAGNOSIS — E039 Hypothyroidism, unspecified: Secondary | ICD-10-CM | POA: Diagnosis not present

## 2024-04-17 DIAGNOSIS — E119 Type 2 diabetes mellitus without complications: Secondary | ICD-10-CM | POA: Diagnosis not present

## 2024-04-17 DIAGNOSIS — Z1231 Encounter for screening mammogram for malignant neoplasm of breast: Secondary | ICD-10-CM

## 2024-04-17 DIAGNOSIS — Z794 Long term (current) use of insulin: Secondary | ICD-10-CM

## 2024-04-17 DIAGNOSIS — D649 Anemia, unspecified: Secondary | ICD-10-CM | POA: Diagnosis not present

## 2024-04-17 DIAGNOSIS — N1831 Chronic kidney disease, stage 3a: Secondary | ICD-10-CM | POA: Diagnosis not present

## 2024-04-17 DIAGNOSIS — L304 Erythema intertrigo: Secondary | ICD-10-CM

## 2024-04-17 MED ORDER — AZELASTINE HCL 0.1 % NA SOLN: 2.0000 | Freq: Two times a day (BID) | NASAL | 2 refills | Status: AC

## 2024-04-17 MED ORDER — FLUCONAZOLE 150 MG PO TABS: 150.0000 mg | ORAL_TABLET | ORAL | 1 refills | Status: DC

## 2024-04-17 MED ORDER — KETOCONAZOLE 2 % EX CREA: 1.0000 | TOPICAL_CREAM | Freq: Two times a day (BID) | CUTANEOUS | 0 refills | Status: AC

## 2024-04-17 NOTE — Progress Notes (Signed)
 Name: [name]   MRN: [id number]    DOB: May 16, 1966   Date:04/17/2024       Progress Note  Subjective  Chief Complaint  Chief Complaint  Patient presents with   Medical Management of Chronic Issues   Rash     raw above her pubic area and underneath her stomach fold she is stating that it has yellow discharge coming out.   Discussed the use of AI scribe software for clinical note transcription with the patient, who gave verbal consent to proceed.  History of Present Illness [name] is a 58 year old female with chronic kidney disease stage 3A who presents with a rash in the abdominal fold area.  She has a rash in the abdominal fold area, described as 'raw' and 'oozing', which appeared a couple of days ago and is painful. Moisture returns quickly after wiping or patting the area dry. She has previously used Diflucan  for similar issues but does not have any remaining at home.  She has chronic kidney disease and during a recent hospital visit for an upper respiratory infection, her kidney function declined and her calcium  levels were low. She also experienced anemia, which had previously resolved but recurred during the hospital stay. Her C-reactive protein was elevated at 10.9 in May.  She has hypothyroidism and is currently taking 50 micrograms of thyroid  medication daily. No symptoms such as hair loss, dry skin, or constipation are present.  She reports ongoing congestion and sniffles following her recent hospital visit, where she was treated with steroids. She is currently taking Zyrtec  daily and has Flonase  for nasal symptoms. Her blood sugar levels have been stable, even with steroid use, remaining under 200.  She has diabetes type 2  and is on insulin  therapy, with well-controlled blood sugar levels. She expresses concern about slow healing due to her condition.    Patient Active Problem List   Diagnosis Date Noted   Adult hypothyroidism 06/20/2023   Class 2 severe obesity  with serious comorbidity and body mass index (BMI) of 35.0 to 35.9 in adult (HCC) 06/19/2023   Vertigo 02/10/2022   TIA (transient ischemic attack) 10/04/2021   HTN (hypertension) 10/04/2021   Chronic kidney disease, stage 3a (HCC) 10/04/2021   COPD (chronic obstructive pulmonary disease) (HCC) 10/04/2021   Chronic diastolic CHF (congestive heart failure) (HCC) 10/04/2021   Atherosclerosis of aorta (HCC) 03/28/2021   Iron deficiency anemia due to chronic blood loss    Polyp of ascending colon    Vitamin B12 deficiency 08/08/2018   Patient is full code 10/30/2017   Coronary artery disease of native artery of native heart with stable angina pectoris (HCC) 10/09/2017   Plantar fascial fibromatosis 10/09/2017   Tendinitis of wrist 10/09/2017   Schizoaffective disorder, bipolar type (HCC) 09/28/2017   Adenomatous colon polyp 10/15/2015   Bipolar affective disorder (HCC) 10/15/2015   Constipation 10/15/2015   NASH (nonalcoholic steatohepatitis) 10/15/2015   Low back pain with left-sided sciatica 08/18/2015   Type 2 diabetes with complication (HCC) 05/11/2015   Apnea, sleep 05/05/2015   Heart valve disease 05/05/2015   CKD stage 3 due to type 2 diabetes mellitus (HCC) 03/15/2015   Migraine 03/12/2015   Chronic bronchitis (HCC) 01/20/2015   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 tacking of bladder and the other was the stimulator placement   CHOLECYSTECTOMY     COLONOSCOPY WITH PROPOFOL  N/A  03/04/2019   Procedure: COLONOSCOPY WITH PROPOFOL ;  Surgeon: Jinny Carmine, MD;  Location: [facility];  Service: Endoscopy;  Laterality: N/A;   ESOPHAGOGASTRODUODENOSCOPY (EGD) WITH PROPOFOL  N/A 03/04/2019   Procedure: ESOPHAGOGASTRODUODENOSCOPY (EGD) WITH PROPOFOL ;  Surgeon: Jinny Carmine, MD;  Location: [facility];  Service: Endoscopy;  Laterality: N/A;   INCISION AND DRAINAGE ABSCESS Right 04/16/2019   Procedure: INCISION AND DRAINAGE ABSCESS;  Surgeon: Tye Millet, DO;  Location: [facility];  Service: General;  Laterality: Right;   INTERSTIM IMPLANT PLACEMENT N/A 07/16/2023   Procedure: RENNA IMPLANT FIRST STAGE WITH IMPEDANCE CHECK;  Surgeon: Gaston Hamilton, MD;  Location: [facility];  Service: Urology;  Laterality: N/A;   INTERSTIM IMPLANT PLACEMENT N/A 07/16/2023   Procedure: RENNA IMPLANT SECOND STAGE;  Surgeon: Gaston Hamilton, MD;  Location: [facility];  Service: Urology;  Laterality: N/A;   INTERSTIM IMPLANT REMOVAL N/A 07/16/2023   Procedure: REMOVAL OF INTERSTIM IMPLANT;  Surgeon: Gaston Hamilton, MD;  Location: [facility];  Service: Urology;  Laterality: N/A;   INTERSTIM-GENERATOR CHANGE N/A 12/31/2017   Procedure: INTERSTIM-GENERATOR CHANGE;  Surgeon: Gaston Hamilton, MD;  Location: [facility];  Service: Urology;  Laterality: N/A;   LEFT HEART CATH AND CORONARY ANGIOGRAPHY Left 04/13/2011   Procedure: LEFT HEART CATH AND CORONARY ANGIOGRAPHY; Location: [facility]; Surgeon: Wolm Rhyme, MD   LIVER BIOPSY  11/2014   Negative   LUNG BIOPSY  2015   small spot on lung   TUBAL LIGATION     UPPER GI ENDOSCOPY     WRIST SURGERY Right 11/02/2019    Family History  Problem Relation Age of Onset   Diabetes Mother    Heart disease Mother    Hyperlipidemia Mother    Hypertension Mother    Kidney disease Mother    Mental illness Mother    Hypothyroidism Mother    Stroke Mother    Osteoporosis Mother    Glaucoma Mother    Congestive Heart Failure Mother     Hypertension Father    Kidney disease Brother    Intracerebral hemorrhage Brother    Breast cancer Maternal Grandmother 60   Heart disease Maternal Grandfather    Rheum arthritis Maternal Grandfather    Cancer Maternal Grandfather        liver   Kidney disease Maternal Grandfather    Cancer Paternal Grandmother        lung   Asthma Daughter    Cancer Daughter        throat   Bipolar disorder Daughter    Bipolar disorder Daughter    Hypertension Daughter    Restless legs syndrome Daughter     Social History   Tobacco Use   Smoking status: Never   Smokeless tobacco: Never  Substance Use Topics   Alcohol use: No     Current Outpatient Medications:    atorvastatin  (LIPITOR ) 80 MG tablet, Take 1 tablet (80 mg total) by mouth at bedtime., Disp: 90 tablet, Rfl: 1   busPIRone  (BUSPAR ) 30 MG tablet, Take 30 mg by mouth 2 (two) times daily., Disp: , Rfl:  citalopram  (CELEXA ) 20 MG tablet, Take by mouth., Disp: , Rfl:    Dulaglutide  (TRULICITY ) 1.5 MG/0.5ML SOAJ, Inject into the skin., Disp: , Rfl:    DULoxetine  (CYMBALTA ) 60 MG capsule, 1 capsule., Disp: , Rfl:    empagliflozin  (JARDIANCE ) 25 MG TABS tablet, Take 25 mg by mouth daily., Disp: , Rfl:    ezetimibe  (ZETIA ) 10 MG tablet, Take 1 tablet (10 mg total) by mouth daily., Disp: 90 tablet, Rfl: 1   fenofibrate  micronized (LOFIBRA) 134 MG capsule, Take 1 capsule (134 mg total) by mouth daily., Disp: 90 capsule, Rfl: 1   fluconazole  (DIFLUCAN ) 150 MG tablet, Take 1 tablet (150 mg total) by mouth every other day., Disp: 3 tablet, Rfl: 0   gabapentin  (NEURONTIN ) 600 MG tablet, Take 600 mg by mouth at bedtime., Disp: , Rfl:    hydrOXYzine  (ATARAX ) 50 MG tablet, Take 50 mg by mouth 3 (three) times daily., Disp: , Rfl:    insulin  glargine, 2 Unit Dial, (TOUJEO  MAX) 300 UNIT/ML Solostar Pen, Inject 80 Units into the skin., Disp: , Rfl:    lamoTRIgine  (LAMICTAL ) 200 MG tablet, Take 200 mg by mouth 2 (two) times daily. , Disp: , Rfl:     levothyroxine  (SYNTHROID ) 50 MCG tablet, Take 1 tablet (50 mcg total) by mouth daily., Disp: 30 tablet, Rfl: 1   metoprolol  tartrate (LOPRESSOR ) 25 MG tablet, TAKE 1 TABLET BY MOUTH TWICE DAILY, Disp: 90 tablet, Rfl: 3   Multiple Vitamin (MULTI-VITAMIN) tablet, Take 1 tablet by mouth daily., Disp: , Rfl:    nitroGLYCERIN  (NITROSTAT ) 0.4 MG SL tablet, Place 1 tablet (0.4 mg total) under the tongue every 5 (five) minutes as needed for chest pain., Disp: 25 tablet, Rfl: 3   ondansetron  (ZOFRAN -ODT) 8 MG disintegrating tablet, Take 1 tablet (8 mg total) by mouth every 8 (eight) hours as needed for nausea or vomiting., Disp: 20 tablet, Rfl: 0   OneTouch Delica Lancets 30G MISC, by Does not apply route., Disp: , Rfl:    ONETOUCH VERIO test strip, , Disp: , Rfl:    QUEtiapine  (SEROQUEL ) 300 MG tablet, Take 300 mg by mouth at bedtime., Disp: , Rfl:    benzonatate  (TESSALON ) 100 MG capsule, Take 1-2 capsules (100-200 mg total) by mouth 3 (three) times daily as needed. (Patient not taking: Reported on 04/17/2024), Disp: 40 capsule, Rfl: 0   predniSONE  (STERAPRED UNI-PAK 21 TAB) 10 MG (21) TBPK tablet, As directed on packaging (Patient not taking: Reported on 04/17/2024), Disp: 1 each, Rfl: 0  Allergies  Allergen Reactions   Mirtazapine Anaphylaxis    closes throat   Morphine And Codeine Anaphylaxis   Sulfamethoxazole-Trimethoprim Rash    Whelps all over   Amoxicillin  Er Nausea And Vomiting   Codeine Itching and Rash   Hydrocodone -Acetaminophen  Rash   Keflex  [Cephalexin ] Rash    Pt does not remember having a reaction or rash, no airway involvement   Prasterone  Nausea And Vomiting    I personally reviewed active problem list, medication list, allergies with the patient/caregiver today.   ROS  Ten systems reviewed and is negative except as mentioned in HPI    Objective Physical Exam CONSTITUTIONAL: Patient appears well-developed and well-nourished. No distress. HEENT: Head atraumatic,  normocephalic, neck supple. CARDIOVASCULAR: Normal rate, regular rhythm and normal heart sounds. No murmur heard. No BLE edema. PULMONARY: Effort normal and breath sounds normal. No respiratory distress. ABDOMINAL: There is no tenderness or distention. MUSCULOSKELETAL: Normal gait. Without gross motor or sensory deficit. PSYCHIATRIC: Patient has a  normal mood and affect. Behavior is normal. Judgment and thought content normal. SKIN: Rash in the abdominal fold, oozing, no ulcerations, raw and inflamed.    Abdominal fold   Vitals:   04/17/24 1038  BP: 112/68  Pulse: 80  Resp: 16  SpO2: 92%  Weight: 174 lb 12.8 oz (79.3 kg)  Height: 4' 11 (1.499 m)    Body mass index is 35.31 kg/m.  Recent Results (from the past 2160 hours)  CBC with Differential/Platelet     Status: Abnormal   Collection Time: 02/04/24 11:50 AM  Result Value Ref Range   WBC 8.0 3.8 - 10.8 Thousand/uL   RBC 4.38 3.80 - 5.10 Million/uL   Hemoglobin 12.0 11.7 - 15.5 g/dL   HCT 60.8 64.9 - 54.9 %   MCV 89.3 80.0 - 100.0 fL   MCH 27.4 27.0 - 33.0 pg   MCHC 30.7 (L) 32.0 - 36.0 g/dL    Comment: For adults, a slight decrease in the calculated MCHC value (in the range of 30 to 32 g/dL) is most likely not clinically significant; however, it should be interpreted with caution in correlation with other red cell parameters and the patient's clinical condition.    RDW 11.8 11.0 - 15.0 %   Platelets 267 140 - 400 Thousand/uL   MPV 9.8 7.5 - 12.5 fL   Neutro Abs 4,936 1,500 - 7,800 cells/uL   Absolute Lymphocytes 2,296 850 - 3,900 cells/uL   Absolute Monocytes 568 200 - 950 cells/uL   Eosinophils Absolute 128 15 - 500 cells/uL   Basophils Absolute 72 0 - 200 cells/uL   Neutrophils Relative % 61.7 %   Total Lymphocyte 28.7 %   Monocytes Relative 7.1 %   Eosinophils Relative 1.6 %   Basophils Relative 0.9 %   Smear Review      Comment: Review of peripheral smear confirms automated results.   Comprehensive  metabolic panel with GFR     Status: Abnormal   Collection Time: 02/04/24 11:50 AM  Result Value Ref Range   Glucose, Bld 399 (H) 65 - 99 mg/dL    Comment: Verified by repeat analysis. SABRA .            Fasting reference interval . For someone without known diabetes, a glucose value >125 mg/dL indicates that they may have diabetes and this should be confirmed with a follow-up test. .    BUN 43 (H) 7 - 25 mg/dL   Creat 8.61 (H) 9.49 - 1.05 mg/dL   eGFR 44 (L) > OR = 60 mL/min/1.72m2   BUN/Creatinine Ratio 31 (H) 6 - 22 (calc)   Sodium 138 135 - 146 mmol/L   Potassium 5.5 (H) 3.5 - 5.3 mmol/L   Chloride 102 98 - 110 mmol/L   CO2 27 20 - 32 mmol/L   Calcium  9.1 8.6 - 10.4 mg/dL   Total Protein 5.8 (L) 6.1 - 8.1 g/dL   Albumin 3.8 3.6 - 5.1 g/dL   Globulin 2.0 1.9 - 3.7 g/dL (calc)   AG Ratio 1.9 1.0 - 2.5 (calc)   Total Bilirubin 0.2 0.2 - 1.2 mg/dL   Alkaline phosphatase (APISO) 51 37 - 153 U/L   AST 23 10 - 35 U/L   ALT 17 6 - 29 U/L  Sedimentation rate     Status: None   Collection Time: 02/04/24 11:50 AM  Result Value Ref Range   Sed Rate 9 0 - 30 mm/h  TSH     Status: Abnormal  Collection Time: 02/04/24 11:50 AM  Result Value Ref Range   TSH 7.78 (H) 0.40 - 4.50 mIU/L  C-reactive protein     Status: Abnormal   Collection Time: 02/04/24 11:50 AM  Result Value Ref Range   CRP 10.9 (H) <8.0 mg/L  POCT urinalysis dipstick     Status: Abnormal   Collection Time: 02/04/24 12:05 PM  Result Value Ref Range   Color, UA Yellow    Clarity, UA Clear    Glucose, UA Positive (A) Negative   Bilirubin, UA Negative    Ketones, UA Negative    Spec Grav, UA 1.020 1.010 - 1.025   Blood, UA Negative    pH, UA 5.0 5.0 - 8.0   Protein, UA Negative Negative   Urobilinogen, UA 0.2 (A) 0.2 or 1.0 E.U./dL   Nitrite, UA Negative    Leukocytes, UA Negative Negative   Appearance yellow    Odor none   Urine Culture     Status: None   Collection Time: 02/04/24 12:13 PM   Specimen:  Urine  Result Value Ref Range   MICRO NUMBER: [id number]    SPECIMEN QUALITY: Adequate    Sample Source URINE    STATUS: FINAL    Result: No Growth   POCT glycosylated hemoglobin (Hb A1C)     Status: Abnormal   Collection Time: 02/07/24  9:20 AM  Result Value Ref Range   Hemoglobin A1C 10.8 (A) 4.0 - 5.6 %   HbA1c POC (<> result, manual entry)     HbA1c, POC (prediabetic range)     HbA1c, POC (controlled diabetic range)    POCT rapid strep A     Status: None   Collection Time: 04/03/24  9:59 AM  Result Value Ref Range   Rapid Strep A Screen Negative Negative  POC Covid19/Flu A&B Antigen     Status: Abnormal   Collection Time: 04/03/24  9:59 AM  Result Value Ref Range   Influenza A Antigen, POC Negative Negative   Influenza B Antigen, POC Negative Negative   Covid Antigen, POC Positive (A) Negative  Basic metabolic panel     Status: Abnormal   Collection Time: 04/10/24  4:05 PM  Result Value Ref Range   Sodium 139 135 - 145 mmol/L   Potassium 4.8 3.5 - 5.1 mmol/L   Chloride 104 98 - 111 mmol/L   CO2 26 22 - 32 mmol/L   Glucose, Bld 300 (H) 70 - 99 mg/dL    Comment: Glucose reference range applies only to samples taken after fasting for at least 8 hours.   BUN 36 (H) 6 - 20 mg/dL   Creatinine, Ser 8.58 (H) 0.44 - 1.00 mg/dL   Calcium  8.7 (L) 8.9 - 10.3 mg/dL   GFR, Estimated 43 (L) >60 mL/min    Comment: (NOTE) Calculated using the CKD-EPI Creatinine Equation (2021)    Anion gap 9 5 - 15    Comment: Performed at [facility], [address] [place], [place] [address]  CBC     Status: Abnormal   Collection Time: 04/10/24  4:05 PM  Result Value Ref Range   WBC 8.2 4.0 - 10.5 K/uL   RBC 4.18 3.87 - 5.11 MIL/uL   Hemoglobin 11.1 (L) 12.0 - 15.0 g/dL   HCT 63.4 63.9 - 53.9 %   MCV 87.3 80.0 - 100.0 fL   MCH 26.6 26.0 - 34.0 pg   MCHC 30.4 30.0 - 36.0 g/dL   RDW 83.0 (H) 88.4 -  15.5 %   Platelets 312 150 - 400 K/uL   nRBC 0.0 0.0 - 0.2 %    Comment: Performed  at [facility], [address] [place], [place] [address]  Troponin I (High Sensitivity)     Status: None   Collection Time: 04/10/24  4:05 PM  Result Value Ref Range   Troponin I (High Sensitivity) 5 <18 ng/L    Comment: (NOTE) Elevated high sensitivity troponin I (hsTnI) values and significant  changes across serial measurements may suggest ACS but many other  chronic and acute conditions are known to elevate hsTnI results.  Refer to the Links section for chest pain algorithms and additional  guidance. Performed at [facility], [address] [place], [place] [address]   Resp panel by RT-PCR (RSV, Flu A&B, Covid) Anterior Nasal Swab     Status: None   Collection Time: 04/10/24  4:06 PM   Specimen: Anterior Nasal Swab  Result Value Ref Range   SARS Coronavirus 2 by RT PCR NEGATIVE NEGATIVE    Comment: (NOTE) SARS-CoV-2 target nucleic acids are NOT DETECTED.  The SARS-CoV-2 RNA is generally detectable in upper respiratory specimens during the acute phase of infection. The lowest concentration of SARS-CoV-2 viral copies this assay can detect is 138 copies/mL. A negative result does not preclude SARS-Cov-2 infection and should not be used as the sole basis for treatment or other patient management decisions. A negative result may occur with  improper specimen collection/handling, submission of specimen other than nasopharyngeal swab, presence of viral mutation(s) within the areas targeted by this assay, and inadequate number of viral copies(<138 copies/mL). A negative result must be combined with clinical observations, patient history, and epidemiological information. The expected result is Negative.  Fact Sheet for Patients:  BloggerCourse.com  Fact Sheet for Healthcare Providers:  SeriousBroker.it  This test is no t yet approved or cleared by the [place]  FDA and  has been authorized for  detection and/or diagnosis of SARS-CoV-2 by FDA under an Emergency Use Authorization (EUA). This EUA will remain  in effect (meaning this test can be used) for the duration of the COVID-19 declaration under Section 564(b)(1) of the Act, 21 U.S.C.section 360bbb-3(b)(1), unless the authorization is terminated  or revoked sooner.       Influenza A by PCR NEGATIVE NEGATIVE   Influenza B by PCR NEGATIVE NEGATIVE    Comment: (NOTE) The Xpert Xpress SARS-CoV-2/FLU/RSV plus assay is intended as an aid in the diagnosis of influenza from Nasopharyngeal swab specimens and should not be used as a sole basis for treatment. Nasal washings and aspirates are unacceptable for Xpert Xpress SARS-CoV-2/FLU/RSV testing.  Fact Sheet for Patients: BloggerCourse.com  Fact Sheet for Healthcare Providers: SeriousBroker.it  This test is not yet approved or cleared by the [place]  FDA and has been authorized for detection and/or diagnosis of SARS-CoV-2 by FDA under an Emergency Use Authorization (EUA). This EUA will remain in effect (meaning this test can be used) for the duration of the COVID-19 declaration under Section 564(b)(1) of the Act, 21 U.S.C. section 360bbb-3(b)(1), unless the authorization is terminated or revoked.     Resp Syncytial Virus by PCR NEGATIVE NEGATIVE    Comment: (NOTE) Fact Sheet for Patients: BloggerCourse.com  Fact Sheet for Healthcare Providers: SeriousBroker.it  This test is not yet approved or cleared by the [place]  FDA and has been authorized for detection and/or diagnosis of SARS-CoV-2 by FDA under an Emergency Use Authorization (EUA). This EUA will remain in  effect (meaning this test can be used) for the duration of the COVID-19 declaration under Section 564(b)(1) of the Act, 21 U.S.C. section 360bbb-3(b)(1), unless the authorization is terminated  or revoked.  Performed at [facility], [address] [place], [place] [address]       PHQ2/9:    04/17/2024   10:31 AM 04/03/2024    9:55 AM 02/07/2024    9:04 AM 02/04/2024   11:04 AM 10/01/2023    9:37 AM  Depression screen PHQ 2/9  Decreased Interest 0 0 0 0 0  Down, Depressed, Hopeless 0 0 0 0 0  PHQ - 2 Score 0 0 0 0 0  Altered sleeping 0 0 0 0 0  Tired, decreased energy 0 0 0 0 0  Change in appetite 0 0 0 0 0  Feeling bad or failure about yourself  0 0 0 0 0  Trouble concentrating 0 0 0 0 0  Moving slowly or fidgety/restless 0 0 0 0 0  Suicidal thoughts 0 0 0 0 0  PHQ-9 Score 0 0 0 0 0  Difficult doing work/chores Not difficult at all Not difficult at all Not difficult at all Not difficult at all Not difficult at all    phq 9 is negative  Fall Risk:    04/17/2024   10:31 AM 04/03/2024    9:41 AM 02/07/2024    9:04 AM 02/04/2024   11:04 AM 10/01/2023    9:37 AM  Fall Risk   Falls in the past year? 0 0 0 0 0  Number falls in past yr: 0 0 0 0 0  Injury with Fall? 0 0 0 0 0  Risk for fall due to : No Fall Risks No Fall Risks No Fall Risks No Fall Risks No Fall Risks  Follow up Falls evaluation completed Falls evaluation completed Falls prevention discussed;Education provided;Falls evaluation completed Falls prevention discussed;Education provided;Falls evaluation completed Falls prevention discussed;Education provided;Falls evaluation completed     Assessment & Plan Intertrigo with secondary fungal infection, abdominal fold Intertrigo with secondary fungal infection in the abdominal fold causing raw, oozing skin, inflammation, and pain. No ulcerations. Difficulty keeping area dry. - Prescribed oral fluconazole  every other day for one week. - Prescribed ketoconazole  cream for topical application. - Advised mixing ketoconazole  with over-the-counter hydrocortisone if pain occurs. - Recommended nystatin  powder if ketoconazole  is not covered by insurance. -  Instructed to keep the area dry using gauze and apply cream on each side of the gauze. - Advised using a fan to help dry the area and avoid moisture. - Instructed to report if the condition does not improve.  Type 2 diabetes mellitus, insulin  requiring Type 2 diabetes mellitus, insulin  requiring. Blood sugar levels well-controlled under 200 mg/dL despite steroid use. Concern about slow healing affecting intertrigo.  Chronic kidney disease stage 3A  Chronic kidney disease stage 3A  with recent decline in kidney function and low calcium  levels. - Ordered blood work to assess kidney function and calcium  levels.  Hypothyroidism Hypothyroidism with previously abnormal thyroid  function tests. Currently asymptomatic on 50 micrograms of thyroid  medication daily. - Ordered TSH test to evaluate thyroid  function.  Anemia, recurrent Recurrent anemia noted during recent hospitalization, requires further investigation. - Ordered CBC and iron studies, including iron, total iron binding capacity, and ferritin levels.  Allergic rhinitis, perennial Perennial allergic rhinitis with congestion and sniffles, possibly exacerbated by weather changes. - Instructed to take Zyrtec  daily. - Instructed to use Flonase  daily. - Prescribed Astelin   nasal spray for additional antihistamine support.

## 2024-04-18 ENCOUNTER — Other Ambulatory Visit: Payer: Self-pay

## 2024-04-18 ENCOUNTER — Ambulatory Visit: Payer: Self-pay | Admitting: Family Medicine

## 2024-04-18 ENCOUNTER — Emergency Department
Admission: EM | Admit: 2024-04-18 | Discharge: 2024-04-18 | Disposition: A | Discharge status: Home / Self Care | Attending: Emergency Medicine | Admitting: Emergency Medicine

## 2024-04-18 DIAGNOSIS — B372 Candidiasis of skin and nail: Secondary | ICD-10-CM | POA: Diagnosis not present

## 2024-04-18 DIAGNOSIS — I129 Hypertensive chronic kidney disease with stage 1 through stage 4 chronic kidney disease, or unspecified chronic kidney disease: Secondary | ICD-10-CM | POA: Diagnosis not present

## 2024-04-18 DIAGNOSIS — E1122 Type 2 diabetes mellitus with diabetic chronic kidney disease: Secondary | ICD-10-CM | POA: Diagnosis not present

## 2024-04-18 DIAGNOSIS — E039 Hypothyroidism, unspecified: Secondary | ICD-10-CM | POA: Diagnosis not present

## 2024-04-18 DIAGNOSIS — L03311 Cellulitis of abdominal wall: Secondary | ICD-10-CM | POA: Insufficient documentation

## 2024-04-18 DIAGNOSIS — J449 Chronic obstructive pulmonary disease, unspecified: Secondary | ICD-10-CM | POA: Diagnosis not present

## 2024-04-18 DIAGNOSIS — L304 Erythema intertrigo: Secondary | ICD-10-CM

## 2024-04-18 DIAGNOSIS — N189 Chronic kidney disease, unspecified: Secondary | ICD-10-CM | POA: Diagnosis not present

## 2024-04-18 LAB — CBC WITH DIFFERENTIAL/PLATELET
Abs Immature Granulocytes: 0.17 K/uL — ABNORMAL HIGH (ref 0.00–0.07)
Absolute Lymphocytes: 3338 {cells}/uL (ref 850–3900)
Absolute Monocytes: 1064 {cells}/uL — ABNORMAL HIGH (ref 200–950)
Basophils Absolute: 53 {cells}/uL (ref 0–200)
Basophils Absolute: 0.1 K/uL (ref 0.0–0.1)
Basophils Relative: 0.4 %
Basophils Relative: 1 %
Eosinophils Absolute: 426 {cells}/uL (ref 15–500)
Eosinophils Absolute: 0.3 K/uL (ref 0.0–0.5)
Eosinophils Relative: 3.2 %
Eosinophils Relative: 2 %
HCT: 38 % (ref 35.0–45.0)
HCT: 37.6 % (ref 36.0–46.0)
Hemoglobin: 11.5 g/dL — ABNORMAL LOW (ref 11.7–15.5)
Hemoglobin: 11.3 g/dL — ABNORMAL LOW (ref 12.0–15.0)
Immature Granulocytes: 1 %
Lymphocytes Relative: 28 %
Lymphs Abs: 3.6 K/uL (ref 0.7–4.0)
MCH: 26.6 pg — ABNORMAL LOW (ref 27.0–33.0)
MCH: 25.9 pg — ABNORMAL LOW (ref 26.0–34.0)
MCHC: 30.3 g/dL — ABNORMAL LOW (ref 32.0–36.0)
MCHC: 30.1 g/dL (ref 30.0–36.0)
MCV: 87.8 fL (ref 80.0–100.0)
MCV: 86.2 fL (ref 80.0–100.0)
MPV: 9.4 fL (ref 7.5–12.5)
Monocytes Absolute: 1 K/uL (ref 0.1–1.0)
Monocytes Relative: 8 %
Monocytes Relative: 8 %
Neutro Abs: 8419 {cells}/uL — ABNORMAL HIGH (ref 1500–7800)
Neutro Abs: 7.9 K/uL — ABNORMAL HIGH (ref 1.7–7.7)
Neutrophils Relative %: 63.3 %
Neutrophils Relative %: 60 %
Platelets: 394 Thousand/uL (ref 140–400)
Platelets: 387 K/uL (ref 150–400)
RBC: 4.33 Million/uL (ref 3.80–5.10)
RBC: 4.36 MIL/uL (ref 3.87–5.11)
RDW: 13.9 % (ref 11.0–15.0)
RDW: 16.4 % — ABNORMAL HIGH (ref 11.5–15.5)
Total Lymphocyte: 25.1 %
WBC: 13.3 Thousand/uL — ABNORMAL HIGH (ref 3.8–10.8)
WBC: 13 K/uL — ABNORMAL HIGH (ref 4.0–10.5)
nRBC: 0 % (ref 0.0–0.2)

## 2024-04-18 LAB — URINALYSIS, ROUTINE W REFLEX MICROSCOPIC
Bacteria, UA: NONE SEEN
Bilirubin Urine: NEGATIVE
Glucose, UA: >=500 mg/dL — AB
Hgb urine dipstick: NEGATIVE
Ketones, ur: NEGATIVE mg/dL
Leukocytes,Ua: NEGATIVE
Nitrite: NEGATIVE
Protein, ur: NEGATIVE mg/dL
Specific Gravity, Urine: 1.032 — ABNORMAL HIGH (ref 1.005–1.030)
pH: 5 (ref 5.0–8.0)

## 2024-04-18 LAB — COMPREHENSIVE METABOLIC PANEL WITH GFR
AG Ratio: 1.6 (calc) (ref 1.0–2.5)
ALT: 18 U/L (ref 6–29)
ALT: 16 U/L (ref 0–44)
AST: 18 U/L (ref 10–35)
AST: 24 U/L (ref 15–41)
Albumin: 3.3 g/dL — ABNORMAL LOW (ref 3.6–5.1)
Albumin: 2.6 g/dL — ABNORMAL LOW (ref 3.5–5.0)
Alkaline Phosphatase: 52 U/L (ref 38–126)
Alkaline phosphatase (APISO): 54 U/L (ref 37–153)
Anion gap: 11 (ref 5–15)
BUN/Creatinine Ratio: 19 (calc) (ref 6–22)
BUN: 30 mg/dL — ABNORMAL HIGH (ref 7–25)
BUN: 30 mg/dL — ABNORMAL HIGH (ref 6–20)
CO2: 30 mmol/L (ref 20–32)
CO2: 22 mmol/L (ref 22–32)
Calcium: 8.6 mg/dL (ref 8.6–10.4)
Calcium: 7.9 mg/dL — ABNORMAL LOW (ref 8.9–10.3)
Chloride: 101 mmol/L (ref 98–110)
Chloride: 105 mmol/L (ref 98–111)
Creat: 1.56 mg/dL — ABNORMAL HIGH (ref 0.50–1.05)
Creatinine, Ser: 1.53 mg/dL — ABNORMAL HIGH (ref 0.44–1.00)
GFR, Estimated: 39 mL/min — ABNORMAL LOW (ref >60)
Globulin: 2.1 g/dL (ref 1.9–3.7)
Glucose, Bld: 219 mg/dL — ABNORMAL HIGH (ref 65–99)
Glucose, Bld: 290 mg/dL — ABNORMAL HIGH (ref 70–99)
Potassium: 4.4 mmol/L (ref 3.5–5.3)
Potassium: 4.3 mmol/L (ref 3.5–5.1)
Sodium: 140 mmol/L (ref 135–146)
Sodium: 138 mmol/L (ref 135–145)
Total Bilirubin: 0.2 mg/dL (ref 0.2–1.2)
Total Bilirubin: 0.7 mg/dL (ref 0.0–1.2)
Total Protein: 5.4 g/dL — ABNORMAL LOW (ref 6.1–8.1)
Total Protein: 5.6 g/dL — ABNORMAL LOW (ref 6.5–8.1)
eGFR: 38 mL/min/1.73m2 — ABNORMAL LOW (ref >=60)

## 2024-04-18 LAB — IRON,TIBC AND FERRITIN PANEL
%SAT: 13 % — ABNORMAL LOW (ref 16–45)
Ferritin: 94 ng/mL (ref 16–232)
Iron: 40 ug/dL — ABNORMAL LOW (ref 45–160)
TIBC: 318 ug/dL (ref 250–450)

## 2024-04-18 LAB — TSH: TSH: 1.61 m[IU]/L (ref 0.40–4.50)

## 2024-04-18 LAB — C-REACTIVE PROTEIN: CRP: 44.5 mg/L — ABNORMAL HIGH (ref <8.0)

## 2024-04-18 LAB — LACTIC ACID, PLASMA
Lactic Acid, Venous: 1.3 mmol/L (ref 0.5–1.9)
Lactic Acid, Venous: 1.5 mmol/L (ref 0.5–1.9)

## 2024-04-18 MED ORDER — TRAMADOL HCL 50 MG PO TABS
50.0000 mg | ORAL_TABLET | Freq: Once | ORAL | Status: AC
  Administered 2024-04-18: 50 mg via ORAL
  Filled 2024-04-18: qty 1

## 2024-04-18 MED ORDER — KETOCONAZOLE 2 % EX CREA
TOPICAL_CREAM | Freq: Once | CUTANEOUS | Status: AC
  Filled 2024-04-18: qty 15

## 2024-04-18 MED ORDER — CEPHALEXIN 500 MG PO CAPS: 500.0000 mg | ORAL_CAPSULE | Freq: Four times a day (QID) | ORAL | 0 refills | Status: AC

## 2024-04-18 MED ORDER — CEPHALEXIN 500 MG PO CAPS
500.0000 mg | ORAL_CAPSULE | Freq: Once | ORAL | Status: AC
  Administered 2024-04-18: 500 mg via ORAL
  Filled 2024-04-18: qty 1

## 2024-04-18 MED ORDER — TRAMADOL HCL 50 MG PO TABS: 50.0000 mg | ORAL_TABLET | Freq: Three times a day (TID) | ORAL | 0 refills | Status: AC

## 2024-04-18 MED ORDER — FLUCONAZOLE 50 MG PO TABS
150.0000 mg | ORAL_TABLET | Freq: Once | ORAL | Status: AC
  Administered 2024-04-18: 150 mg via ORAL
  Filled 2024-04-18: qty 1

## 2024-04-18 NOTE — ED Triage Notes (Signed)
 Pt arrives with c/o wound infection in the right ABD fold that started about 2 days ago. Per pt, the wound is draining pus. Pt denies fevers. Pt is diabetic.

## 2024-04-18 NOTE — ED Provider Notes (Signed)
 [facility] Emergency Department Provider Note     Event Date/Time   First MD Initiated Contact with Patient 04/18/24 1758     (approximate)   History   Wound Infection   HPI  [name] is a 58 y.o. female with a history of morbid obesity, DM type II, hypothyroidism, OSA, HTN, CKD, COPD, HLD, heart attack, who presents to the ED endorsing a wound infection.  Patient reports purulent drainage from her folds that started about 2 days ago.  She denies any frank fevers, chills, or sweats.  Physical Exam   Triage Vital Signs: ED Triage Vitals  Encounter Vitals Group     BP 04/18/24 1443 (!) 112/53     Girls Systolic BP Percentile --      Girls Diastolic BP Percentile --      Boys Systolic BP Percentile --      Boys Diastolic BP Percentile --      Pulse Rate 04/18/24 1443 80     Resp 04/18/24 1443 19     Temp 04/18/24 1443 98.5 F (36.9 C)     Temp Source 04/18/24 1443 Oral     SpO2 04/18/24 1443 93 %     Weight 04/18/24 1442 174 lb (78.9 kg)     Height 04/18/24 1442 4' 11 (1.499 m)     Head Circumference --      Peak Flow --      Pain Score 04/18/24 1442 10     Pain Loc --      Pain Education --      Exclude from Growth Chart --     Most recent vital signs: Vitals:   04/18/24 1443 04/18/24 1913  BP: (!) 112/53 115/67  Pulse: 80 88  Resp: 19 18  Temp: 98.5 F (36.9 C) 97.9 F (36.6 C)  SpO2: 93% 94%    General Awake, no distress. NAD HEENT NCAT. PERRL. EOMI. No rhinorrhea. Mucous membranes are moist.  CV:  Good peripheral perfusion.  RESP:  Normal effort.  ABD:  No distention.  Large pannus with well-demarcated erythema and moist weeping skin to the intertriginous folds of the pannus as well as the thighs bilaterally.    ED Results / Procedures / Treatments   Labs (all labs ordered are listed, but only abnormal results are displayed) Labs Reviewed  COMPREHENSIVE METABOLIC PANEL WITH GFR - Abnormal; Notable for the  following components:      Result Value   Glucose, Bld 290 (*)    BUN 30 (*)    Creatinine, Ser 1.53 (*)    Calcium  7.9 (*)    Total Protein 5.6 (*)    Albumin 2.6 (*)    GFR, Estimated 39 (*)    All other components within normal limits  CBC WITH DIFFERENTIAL/PLATELET - Abnormal; Notable for the following components:   WBC 13.0 (*)    Hemoglobin 11.3 (*)    MCH 25.9 (*)    RDW 16.4 (*)    Neutro Abs 7.9 (*)    Abs Immature Granulocytes 0.17 (*)    All other components within normal limits  URINALYSIS, ROUTINE W REFLEX MICROSCOPIC - Abnormal; Notable for the following components:   Color, Urine YELLOW (*)    APPearance CLEAR (*)    Specific Gravity, Urine 1.032 (*)    Glucose, UA >=500 (*)    All other components within normal limits  AEROBIC/ANAEROBIC CULTURE W GRAM STAIN (SURGICAL/DEEP WOUND)  LACTIC ACID, PLASMA  LACTIC ACID, PLASMA    EKG   RADIOLOGY  No results found.   PROCEDURES:  Critical Care performed: No  Procedures   MEDICATIONS ORDERED IN ED: Medications  fluconazole  (DIFLUCAN ) tablet 150 mg (150 mg Oral Given 04/18/24 1910)  cephALEXin  (KEFLEX ) capsule 500 mg (500 mg Oral Given 04/18/24 1909)  ketoconazole  (NIZORAL ) 2 % cream ( Topical Given 04/18/24 1918)  traMADol  (ULTRAM ) tablet 50 mg (50 mg Oral Given 04/18/24 1910)     IMPRESSION / MDM / ASSESSMENT AND PLAN / ED COURSE  I reviewed the triage vital signs and the nursing notes.                              Differential diagnosis includes, but is not limited to, yeast dermatitis, cellulitis, abscess, contact dermatitis  Patient's presentation is most consistent with acute presentation with potential threat to life or bodily function.  Patient's diagnosis is consistent with cutaneous candidiasis and cellulitis of the pannus and intertriginous skin of the trunk.  Patient presents with 2 to 3 days of redness, weeping, and irritation to the intertriginous skin of her abdomen.  Labs-year-old female  elevated white count at 13 with a mild left shift.  Remainder of patient's labs are at baseline including redistribution of her CKD.  Patient will be discharged home with prescriptions for Keflex  and tramadol .  Chart review reveals patient recently given prescription for Diflucan  and ketoconazole  by her PCP.  Patient is to follow up with her PCP as suggested, as needed or otherwise directed. Patient is given ED precautions to return to the ED for any worsening or new symptoms.   FINAL CLINICAL IMPRESSION(S) / ED DIAGNOSES   Final diagnoses:  Cellulitis of abdominal wall  Cutaneous candidiasis     Rx / DC Orders   ED Discharge Orders          Ordered    traMADol  (ULTRAM ) 50 MG tablet  3 times daily        04/18/24 2002    cephALEXin  (KEFLEX ) 500 MG capsule  4 times daily        04/18/24 2002             Note:  This document was prepared using Dragon voice recognition software and may include unintentional dictation errors.    Loyd Candida LULLA Aldona, PA-C 04/19/24 9948    Levander Slate, MD 04/21/24 [phone]

## 2024-04-18 NOTE — Discharge Instructions (Addendum)
 You are being treated for a skin infection of your abdominal wall skin.  It is important to keep this area clean and dry as possible.  Use the antifungal cream daily as directed.  Apply dry dressing to help prevent continued skin to skin contact.  Use a hand dryer on a cool setting to dry the area after bathing and showering.  Take the antibiotic 4 times daily as directed.  Take the previously prescribed antifungal medicine as prescribed.  Follow-up with your primary provider or return to the ED if needed.

## 2024-04-18 NOTE — ED Provider Triage Note (Signed)
 Emergency Medicine Provider Triage Evaluation Note  [name] , a 58 y.o. female  was evaluated in triage.  Pt complains of.  Patient states sheWound infection is not [place].  States has a large infection underneath the pannus.  States she has been septic before due to this type of infection.  States that yellow drainage just keeps wiping off and comes back.  Denies fever.  Review of Systems  Positive:  Negative:   Physical Exam  BP (!) 112/53   Pulse 80   Temp 98.5 F (36.9 C) (Oral)   Resp 19   Ht 4' 11 (1.499 m)   Wt 78.9 kg   SpO2 93%   BMI 35.14 kg/m  Gen:   Awake, no distress   Resp:  Normal effort  MSK:   Moves extremities without difficulty  Other:  Skin underneath pannus is extremely red swollen with drainage noted  Medical Decision Making  Medically screening exam initiated at 2:44 PM.  Appropriate orders placed.  [name] was informed that the remainder of the evaluation will be completed by another provider, this initial triage assessment does not replace that evaluation, and the importance of remaining in the ED until their evaluation is complete.     Gasper Devere ORN, PA-C 04/18/24 1445

## 2024-04-22 LAB — AEROBIC/ANAEROBIC CULTURE W GRAM STAIN (SURGICAL/DEEP WOUND)
Gram Stain: NONE SEEN
Special Requests: NORMAL

## 2024-04-29 ENCOUNTER — Other Ambulatory Visit (HOSPITAL_COMMUNITY): Payer: Self-pay

## 2024-05-14 ENCOUNTER — Ambulatory Visit: Payer: Medicare Other | Admitting: Dermatology

## 2024-05-26 ENCOUNTER — Other Ambulatory Visit: Payer: Self-pay | Admitting: Cardiology

## 2024-05-26 DIAGNOSIS — R002 Palpitations: Secondary | ICD-10-CM

## 2024-06-10 NOTE — Progress Notes (Signed)
 [name]                                          MRN: [id number]   06/10/2024   The VBCI Quality Team Specialist reviewed this patient medical record for the purposes of chart review for care gap closure. The following were reviewed: chart review for care gap closure-kidney health evaluation for diabetes:eGFR  and uACR.    VBCI Quality Team

## 2024-06-19 ENCOUNTER — Ambulatory Visit: Admitting: Family Medicine

## 2024-06-24 NOTE — Telephone Encounter (Unsigned)
 Copied from CRM #[id number]. Topic: Referral - Question >> Jun 24, 2024  8:52 AM Kevelyn M wrote: Reason for CRM: Patient is in need of a new Kidney and GI doctor because they left [place]. Please call her back when everything has been authorized.  Call back #[phone]

## 2024-06-25 ENCOUNTER — Other Ambulatory Visit: Payer: Self-pay | Admitting: Cardiology

## 2024-06-25 DIAGNOSIS — R002 Palpitations: Secondary | ICD-10-CM

## 2024-06-26 ENCOUNTER — Ambulatory Visit: Attending: Cardiology | Admitting: Cardiology

## 2024-06-26 NOTE — Progress Notes (Deleted)
 Cardiology Office Note    Date:  06/26/2024   ID:  Finlay, Mills 1966/04/06, MRN [id number]  PCP:  Glenard Mire, MD  Cardiologist:  Evalene Lunger, MD  Electrophysiologist:  None   Chief Complaint: Follow up  History of Present Illness:   [name] is a 58 y.o. female with history of uncontrolled type 2 diabetes, hypothyroidism, restless leg syndrome, chronic back pain, sinusitis, depression, history of COVID-19 infection, OSA, chronic HFpEF, essential hypertension, TIA, chronic chest pain, coronary artery calcification, aortic atherosclerosis, and schizoaffective disorder who presents for follow-up on***.    Patient had CT of the chest in 2018 which was unable to exclude mild coronary artery calcification in the mid LAD with no evidence of aortic atherosclerosis.  Echo revealed EF 55 to 60% with no RWMA.  In 04/2020 she tested positive for COVID-19 and was admitted for 4 days.  Repeat echo 06/2020 revealed EF 55 to 60%, no RWMA, moderate asymmetric LVH of the septal segment questionable, and indeterminate diastolic parameters.  Carotid duplex showed no evidence of significant disease.  Lexiscan  MPI 10/2020 was ruled low risk scan.  CT attenuation correction images showed mild aortic atherosclerosis with moderate proximal to mid LAD calcification.  She was seen in follow-up 01/2021 with complaints of irregular heartbeat, chest pain, and peripheral edema with metoprolol  subsequently increased.  She was to return in 3 months but was unfortunately lost to follow-up.  She had multiple hospitalizations and emergency department visits in 2023 for TIA symptoms, aphasia, weakness, falls, and emesis.  In 2024 she had 11 visits to the emergency department for various symptoms of weakness, nausea, falls, dysuria, and flank pain.  She had an ED visit 04/2023 after mechanical fall and stated history of vertigo.  She was treated with meclizine  and discharged home.   Patient was most recently seen  in our office 05/11/2023 overall doing well from a cardiac perspective.  She continues to suffer from extreme vertigo with several falls in her home requiring emergency department visits.  She reported being out of her metoprolol  for approximately 2 weeks.  Blood pressure was mildly elevated.  She was in need of cardiac clearance for upcoming surgery.  No further testing or medication changes were indicated at that time.  ***  Labs independently reviewed: 04/2024-Hgb 11.3, HCT 37.6, platelets 387, potassium 4.3, sodium 138, BUN 30, creatinine 1.53, normal LFTs 02/2024-A1c 10.8 05/2023-TC 88, HDL 28, TG 233, LDL 31  Objective   Past Medical History:  Diagnosis Date   Adenomatous colon polyp    Anxiety    Arthritis    Asthma    Atherosclerosis of aorta    Bacteremia due to Escherichia coli 09/10/2022   Bipolar depression (HCC)    Cerebral microvascular disease    CHF (congestive heart failure) (HCC)    a.) TTE 10/05/2021: EF 60-65%, no RWMAs, norm RVSF, G1DD; b.) TTE with bubble study 02/12/2022: EF 55-60%, mild LVH, no RWMAs, no IAS, G1DD   Chronic bronchitis (HCC)    CKD (chronic kidney disease), stage III (HCC)    Constipation    a.) on linaclotide    COPD (chronic obstructive pulmonary disease) (HCC)    Coronary artery disease    a.) LHC 04/13/2011: normal coronaries; no CAD; b.) MV 10/29/2020: mod p-mLAD disease   Depression    Diabetic neuropathy (HCC)    Dyspnea    Encephalopathy acute    Gastric ulcer    GERD (gastroesophageal reflux disease)    Headache  Heart attack (HCC) 2009   Hepatomegaly    History of 2019 novel coronavirus disease (COVID-19) 04/28/2020   PCR (+): 04/28/2020, 02/14/2021   Hyperlipidemia    Hypertension    Hypomagnesemia    Hypothyroid    IC (interstitial cystitis)    IDA (iron deficiency anemia)    IgG deficiency (HCC)    Liver lesion    Favored to be benign on CT; MRI of abd w/contract in Aug 2016. Liver Biopsy-Negative, March 2016   Long  term current use of aspirin     Long term current use of clopidogrel     Migraines    Morbid obesity (HCC)    NASH (nonalcoholic steatohepatitis)    OSA (obstructive sleep apnea)    a.) no nocturnal PAP therapy; issues with fit of DME; using 2L/[place] supplemental oxygen    Palpitation    Peripheral vascular disease    Pneumonia    Recurrent falls    RLS (restless legs syndrome)    Schizoaffective disorder, bipolar type (HCC)    Seasonal allergies    Tachycardia    TIA (transient ischemic attack) 2023   a.) on DAPT therapy per neurology recommendations; started 10/2021   Type 2 diabetes, uncontrolled, with neuropathy 10/15/2015   Overview:  Microalbumin 7.7 12/2010: Hgb A1c 10.2% 12/2010 and 9.7% 03/2011, eye exam 10/2009 [facility]    Urinary incontinence    Vertigo    Vitamin B12 deficiency    Vitamin D  deficiency     Current Medications: No outpatient medications have been marked as taking for the 06/26/24 encounter (Appointment) with Gerard Frederick, NP.    Allergies:   Mirtazapine, Morphine and codeine, Sulfamethoxazole-trimethoprim, Amoxicillin  er, Codeine, Hydrocodone -acetaminophen , Keflex  [cephalexin ], and Prasterone    Social History   Socioeconomic History   Marital status: Married    Spouse name: Evalene   Number of children: 2   Years of education: 12   Highest education level: Not on file  Occupational History   Occupation: disabled  Tobacco Use   Smoking status: Never   Smokeless tobacco: Never  Vaping Use   Vaping status: Never Used  Substance and Sexual Activity   Alcohol use: No   Drug use: No   Sexual activity: Yes    Partners: Male    Birth control/protection: Surgical  Other Topics Concern   Not on file  Social History Narrative   Pt lives with husband and grandchildren   Social Drivers of Health   Financial Resource Strain: Low Risk  (06/18/2023)   Received from [facility]   Overall Financial Resource Strain (CARDIA)     Difficulty of Paying Living Expenses: Not hard at all  Food Insecurity: No Food Insecurity (09/24/2023)   Hunger Vital Sign    Worried About Running Out of Food in the Last Year: Never true    Ran Out of Food in the Last Year: Never true  Transportation Needs: No Transportation Needs (09/24/2023)   PRAPARE - Administrator, Civil Service (Medical): No    Lack of Transportation (Non-Medical): No  Physical Activity: Inactive (02/02/2022)   Exercise Vital Sign    Days of Exercise per Week: 0 days    Minutes of Exercise per Session: 0 min  Stress: No Stress Concern Present (02/02/2022)   [employer] of Occupational Health - Occupational Stress Questionnaire    Feeling of Stress : Only a little  Social Connections: Socially Integrated (09/24/2023)   Social Connection and Isolation Panel    Frequency  of Communication with Friends and Family: More than three times a week    Frequency of Social Gatherings with Friends and Family: More than three times a week    Attends Religious Services: 1 to 4 times per year    Active Member of [employer] or Organizations: Yes    Attends Engineer, structural: 1 to 4 times per year    Marital Status: Married     Family History:  [name] family history includes Asthma in her daughter; Bipolar disorder in her daughter and daughter; Breast cancer (age of onset: 66) in her maternal grandmother; Cancer in her daughter, maternal grandfather, and paternal grandmother; Congestive Heart Failure in her mother; Diabetes in her mother; Glaucoma in her mother; Heart disease in her maternal grandfather and mother; Hyperlipidemia in her mother; Hypertension in her daughter, father, and mother; Hypothyroidism in her mother; Intracerebral hemorrhage in her brother; Kidney disease in her brother, maternal grandfather, and mother; Mental illness in her mother; Osteoporosis in her mother; Restless legs syndrome in her daughter; Rheum arthritis in her maternal  grandfather; Stroke in her mother.  ROS:   12-point review of systems is negative unless otherwise noted in the HPI.  EKGs/Other Studies Reviewed:    Studies reviewed were summarized above. The additional studies were reviewed today:  02/2022 Echo complete 1. Left ventricular ejection fraction, by estimation, is 55 to 60%. The  left ventricle has normal function. The left ventricle has no regional  wall motion abnormalities. There is mild left ventricular hypertrophy.  Left ventricular diastolic parameters  are consistent with Grade I diastolic dysfunction (impaired relaxation).   2. Right ventricular systolic function is normal. The right ventricular  size is normal. Tricuspid regurgitation signal is inadequate for assessing  PA pressure.   3. The mitral valve is normal in structure. No evidence of mitral valve  regurgitation. No evidence of mitral stenosis.   4. The aortic valve is normal in structure. Aortic valve regurgitation is  not visualized. No aortic stenosis is present.   5. The inferior vena cava is normal in size with greater than 50%  respiratory variability, suggesting right atrial pressure of 3 mmHg.   6. Agitated saline contrast bubble study was negative, with no evidence  of any interatrial shunt.   11/2021 Long term monitor Monitor 1 Normal sinus rhythm Patient had a min HR of 56 bpm, max HR of 127 bpm, and avg HR of 85 bpm.  Isolated SVEs were rare (<1.0%), and no SVE Couplets or SVE Triplets were present.  No Isolated VEs, VE Couplets, or VE Triplets were present.  No patient triggered events   Monitor 2 Normal sinus rhythm Patient had a min HR of 55 bpm, max HR of 124 bpm, and avg HR of 86 bpm.  Isolated SVEs were rare (<1.0%), and no SVE Couplets or SVE Triplets were present. Isolated VEs were rare (<1.0%), and no VE Couplets  or VE Triplets were present.  No patient triggered events  10/2020 Lexiscan  MPI Pharmacological myocardial perfusion imaging  study with no significant  ischemia Normal wall motion, EF estimated at 65% No EKG changes concerning for ischemia at peak stress or in recovery. CT attenuation correction images with mild aortic atherosclerosis, moderate proximal to mid LAD disease. Low risk scan  EKG:  EKG personally reviewed by me today    PHYSICAL EXAM:    VS:  There were no vitals taken for this visit.  BMI: There is no height or weight on file  to calculate BMI.  GEN: Well nourished, well developed in no acute distress NECK: No JVD; No carotid bruits CARDIAC: ***RRR, no murmurs, rubs, gallops RESPIRATORY:  Clear to auscultation without rales, wheezing or rhonchi  ABDOMEN: Soft, non-tender, non-distended EXTREMITIES:  *** No edema; No deformity  Wt Readings from Last 3 Encounters:  04/18/24 174 lb (78.9 kg)  04/17/24 174 lb 12.8 oz (79.3 kg)  04/10/24 181 lb (82.1 kg)                  ASSESSMENT & PLAN:   Chronic HFpEF   Chronic chest pain   Primary hypertension   Hyperlipidemia   Palpitations   OSA      {Are you ordering a CV Procedure (e.g. stress test, cath, DCCV, TEE, etc)?   Press F2        :[id number]}   Disposition: F/u with Dr. Gollan or an APP in ***.   Medication Adjustments/Labs and Tests Ordered: Current medicines are reviewed at length with the patient today.  Concerns regarding medicines are outlined above. Medication changes, Labs and Tests ordered today are summarized above and listed in the Patient Instructions accessible in Encounters.   Bonney Lesley Maffucci, PA-C 06/26/2024 10:58 AM     [facility] [place] [address] [place], [place] [address] [phone]

## 2024-06-26 NOTE — Telephone Encounter (Signed)
 Copied from CRM [id number]. Topic: Referral - Request for Referral >> Jun 26, 2024 10:27 AM Zebedee SAUNDERS wrote: Did the patient discuss referral with their provider in the last year? Yes (If No - schedule appointment) (If Yes - send message)  Appointment offered? Yes  Type of order/referral and detailed reason for visit: Nephroloy  Preference of office, provider, location: pt's area  If referral order, have you been seen by this specialty before? No (If Yes, this issue or another issue? When? Where?  Can we respond through MyChart? No >> Jun 26, 2024 10:34 AM Zebedee SAUNDERS wrote: Pt is asking for Gastroenterologist since pt's previous doctor has retired.

## 2024-06-27 ENCOUNTER — Encounter: Payer: Self-pay | Admitting: Cardiology

## 2024-07-03 ENCOUNTER — Ambulatory Visit (INDEPENDENT_AMBULATORY_CARE_PROVIDER_SITE_OTHER): Admitting: Nurse Practitioner

## 2024-07-03 ENCOUNTER — Encounter: Payer: Self-pay | Admitting: Nurse Practitioner

## 2024-07-03 VITALS — BP 126/78 | HR 88 | Temp 98.4°F | Resp 18 | Ht 59.0 in | Wt 182.2 lb

## 2024-07-03 DIAGNOSIS — R5383 Other fatigue: Secondary | ICD-10-CM

## 2024-07-03 DIAGNOSIS — J069 Acute upper respiratory infection, unspecified: Secondary | ICD-10-CM

## 2024-07-03 DIAGNOSIS — R52 Pain, unspecified: Secondary | ICD-10-CM | POA: Diagnosis not present

## 2024-07-03 LAB — POC COVID19/FLU A&B COMBO
Covid Antigen, POC: NEGATIVE
Influenza A Antigen, POC: NEGATIVE
Influenza B Antigen, POC: NEGATIVE

## 2024-07-03 MED ORDER — PREDNISONE 10 MG (21) PO TBPK: ORAL_TABLET | ORAL | 0 refills | Status: DC

## 2024-07-03 NOTE — Progress Notes (Signed)
 BP 126/78   Pulse 88   Temp 98.4 F (36.9 C)   Resp 18   Ht 4' 11 (1.499 m)   Wt 182 lb 3.2 oz (82.6 kg)   SpO2 98%   BMI 36.80 kg/m    Subjective:    Patient ID: [name], female    DOB: 01/08/1966, 58 y.o.   MRN: [id number]  HPI: [name] is a 58 y.o. female  Chief Complaint  Patient presents with   Fatigue    Onset 2 days, headache stomach and body aches   Discussed the use of AI scribe software for clinical note transcription with the patient, who gave verbal consent to proceed.  History of Present Illness [name] is a 58 year old female with hypertension, COPD, type two diabetes, and chronic kidney disease who presents with fatigue.  Constitutional symptoms - Fatigue present for the past two days - Associated with headache and body aches  Upper respiratory symptoms - Nasal drainage present, not obstructive - No cough or sore throat -post nasal drip  Recent infectious disease evaluation - Recent rapid COVID and influenza tests negative - Received influenza vaccination  Medication use and response - No medication taken for current symptoms - Previously received steroids during a prior visit, tolerated well  Chronic medical conditions - Hypertension - Chronic obstructive pulmonary disease - Type 2 diabetes mellitus - Chronic kidney disease secondary to type 2 diabetes - Obstructive sleep apnea - Hypothyroidism - Obesity - Vitamin B12 deficiency - Vitamin D  deficiency         04/17/2024   10:31 AM 04/03/2024    9:55 AM 02/07/2024    9:04 AM  Depression screen PHQ 2/9  Decreased Interest 0 0 0  Down, Depressed, Hopeless 0 0 0  PHQ - 2 Score 0 0 0  Altered sleeping 0 0 0  Tired, decreased energy 0 0 0  Change in appetite 0 0 0  Feeling bad or failure about yourself  0 0 0  Trouble concentrating 0 0 0  Moving slowly or fidgety/restless 0 0 0  Suicidal thoughts 0 0 0  PHQ-9 Score 0 0 0  Difficult doing work/chores Not difficult  at all Not difficult at all Not difficult at all    Relevant past medical, surgical, family and social history reviewed and updated as indicated. Interim medical history since our last visit reviewed. Allergies and medications reviewed and updated.  Review of Systems  Ten systems reviewed and is negative except as mentioned in HPI      Objective:      BP 126/78   Pulse 88   Temp 98.4 F (36.9 C)   Resp 18   Ht 4' 11 (1.499 m)   Wt 182 lb 3.2 oz (82.6 kg)   SpO2 98%   BMI 36.80 kg/m    Wt Readings from Last 3 Encounters:  07/03/24 182 lb 3.2 oz (82.6 kg)  04/18/24 174 lb (78.9 kg)  04/17/24 174 lb 12.8 oz (79.3 kg)    Physical Exam GENERAL: Alert, cooperative, well developed, no acute distress. HEENT: Normocephalic, normal oropharynx, moist mucous membranes. CHEST: Clear to auscultation bilaterally, no wheezes, rhonchi, or crackles. CARDIOVASCULAR: Normal heart rate and rhythm, S1 and S2 normal without murmurs. ABDOMEN: Soft, non-tender, non-distended, without organomegaly, normal bowel sounds. EXTREMITIES: No cyanosis or edema. NEUROLOGICAL: Cranial nerves grossly intact, moves all extremities without gross motor or sensory deficit.  Results for orders placed or performed in visit on  07/03/24  POC Covid19/Flu A&B Antigen   Collection Time: 07/03/24  8:47 AM  Result Value Ref Range   Influenza A Antigen, POC Negative Negative   Influenza B Antigen, POC Negative Negative   Covid Antigen, POC Negative Negative   *Note: Due to a large number of results and/or encounters for the requested time period, some results have not been displayed. A complete set of results can be found in Results Review.          Assessment & Plan:   Problem List Items Addressed This Visit   None Visit Diagnoses       Body aches    -  Primary   Relevant Orders   POC Covid19/Flu A&B Antigen (Completed)     Other fatigue       Relevant Orders   POC Covid19/Flu A&B Antigen (Completed)      Viral upper respiratory tract infection       Relevant Medications   predniSONE  (STERAPRED UNI-PAK 21 TAB) 10 MG (21) TBPK tablet        Assessment and Plan Assessment & Plan Acute upper respiratory infection with fatigue and myalgias Acute onset of fatigue, headache, and myalgias for two days. Negative rapid COVID-19 and influenza tests. Symptoms include nasal drainage without cough or sore throat, suggesting a viral etiology. - Patient states that last time she was given a prescription for steroids and that worked well for her. Steroid taper sent in.   - Advise on maintaining hydration to prevent dehydration. - Instruct to send a message with updates on condition. Recommend taking zyrtec , flonase , mucinex , vitamin d , vitamin c , and zinc . Push fluids and get rest.          Follow up plan: Return if symptoms worsen or fail to improve.

## 2024-07-17 NOTE — Progress Notes (Signed)
 [name]                                          MRN: [id number]   07/17/2024   The VBCI Quality Team Specialist reviewed this patient medical record for the purposes of chart review for care gap closure. The following were reviewed: chart review for care gap closure-glycemic status assessment.    VBCI Quality Team

## 2024-07-22 ENCOUNTER — Other Ambulatory Visit: Payer: Self-pay | Admitting: Family Medicine

## 2024-07-22 DIAGNOSIS — I700 Atherosclerosis of aorta: Secondary | ICD-10-CM

## 2024-07-24 NOTE — Telephone Encounter (Signed)
 Requested Prescriptions  Pending Prescriptions Disp Refills   atorvastatin  (LIPITOR ) 80 MG tablet [Pharmacy Med Name: ATORVASTATIN  CALCIUM  80 MG TAB] 90 tablet 0    Sig: TAKE 1 TABLET BY MOUTH AT BEDTIME     Cardiovascular:  Antilipid - Statins Failed - 07/24/2024  4:37 PM      Failed - Lipid Panel in normal range within the last 12 months    Cholesterol, Total  Date Value Ref Range Status  09/22/2015 196 100 - 199 mg/dL Final   Cholesterol  Date Value Ref Range Status  05/09/2023 88 <200 mg/dL Final  97/78/7984 858 0 - 200 mg/dL Final   Ldl Cholesterol, Calc  Date Value Ref Range Status  10/25/2013 SEE COMMENT 0 - 100 mg/dL Final    Comment:    VLDL/LDL - Unable to report VLDL and LDL due to a  - Triglyceride value that is 400 mg/dL or   - greater.    LDL Cholesterol (Calc)  Date Value Ref Range Status  05/09/2023 31 mg/dL (calc) Final    Comment:    Reference range: <100 . Desirable range <100 mg/dL for primary prevention;   <70 mg/dL for patients with CHD or diabetic patients  with > or = 2 CHD risk factors. SABRA LDL-C is now calculated using the Martin-Hopkins  calculation, which is a validated novel method providing  better accuracy than the Friedewald equation in the  estimation of LDL-C.  Gladis APPLETHWAITE et al. SANDREA. 7986;689(80): 2061-2068  (http://education.[employer].com/faq/FAQ164)    Direct LDL  Date Value Ref Range Status  06/08/2008 65.4 mg/dL Final    Comment:    See lab report for associated comment(s)   HDL Cholesterol  Date Value Ref Range Status  10/25/2013 9 (L) 40 - 60 mg/dL Final   HDL  Date Value Ref Range Status  05/09/2023 28 (L) > OR = 50 mg/dL Final  98/81/7982 35 (L) >39 mg/dL Final   Triglycerides  Date Value Ref Range Status  05/09/2023 233 (H) <150 mg/dL Final    Comment:    . If a non-fasting specimen was collected, consider repeat triglyceride testing on a fasting specimen if clinically indicated.  Veatrice et al. J. of  Clin. Lipidol. 2015;9:129-169. .   10/25/2013 830 (H) 0 - 200 mg/dL Final         Passed - Patient is not pregnant      Passed - Valid encounter within last 12 months    Recent Outpatient Visits           3 weeks ago Body aches   [facility] [name], FNP   3 months ago Adult hypothyroidism   [facility] Glenard Mire, MD   3 months ago COVID-19   [facility] Glenard Mire, MD   5 months ago Insulin  dependent type 2 diabetes mellitus [facility])   [facility] Glenard Mire, MD   5 months ago Dysuria   [facility] Sowles, Krichna, MD

## 2024-07-25 ENCOUNTER — Other Ambulatory Visit: Payer: Self-pay | Admitting: Cardiology

## 2024-07-25 DIAGNOSIS — R002 Palpitations: Secondary | ICD-10-CM

## 2024-07-25 NOTE — Telephone Encounter (Signed)
 Please contact pt for future appointment. Pt due for follow up.

## 2024-07-28 NOTE — Telephone Encounter (Signed)
 Scheduled 11/25

## 2024-07-28 NOTE — Progress Notes (Unsigned)
 Date:  07/29/2024   ID:  [name], DOB [birth year], MRN [id number]  Patient Location:  [address] [place] [address]   Provider location:   [facility], [place] office  PCP:  Glenard Mire, MD  Cardiologist:  Perla [facility]  Chief Complaint  Patient presents with   12 month follow up     Patient c/o shortness of breath, tiredness and chest pain.     History of Present Illness:    YOLETTE HASTINGS is a 58 y.o. female  past medical history of uncontrolled T2DM,  Hypothyroidism,  NASH,  Restless Leg Syndrome Chronic back pain Sinusitis Depression mild aortic atherosclerosis, moderate proximal to mid LAD disease on CT scan 2022 Who presents for f/u of her  palpitations, chest pain  LOV with myself 2/22 Seen by one of our providers September 2024  Has not seen endocrine in several months A1c higher 10.8  Has InterStim bladder stimulator  Denies significant tachycardia on metoprolol   Prior cardiac imaging reviewed Echo June 2023 EF 55 to 60%, no significant valvular heart disease  Stress test 2022 no ischemia, low risk study  Labs: Total chol 88, LDL 31 A1C 10.8  EGD 02/2019, Oozing gastric ulcer.  Previously followed by GI  EKG personally reviewed by myself on todays visit EKG Interpretation Date/Time:  Tuesday July 29 2024 08:07:12 EST Ventricular Rate:  79 PR Interval:  180 QRS Duration:  96 QT Interval:  418 QTC Calculation: 479 R Axis:   -28  Text Interpretation: Normal sinus rhythm Minimal voltage criteria for LVH, may be normal variant ( R in aVL ) When compared with ECG of 10-Apr-2024 15:59, T wave inversion now evident in Anterior leads Confirmed by Perla Lye [phone]) on 07/29/2024 8:13:18 AM     Past Medical History:  Diagnosis Date   Adenomatous colon polyp    Anxiety    Arthritis    Asthma    Atherosclerosis of aorta    Bacteremia due to Escherichia coli 09/10/2022   Bipolar depression  (HCC)    Cerebral microvascular disease    CHF (congestive heart failure) (HCC)    a.) TTE 10/05/2021: EF 60-65%, no RWMAs, norm RVSF, G1DD; b.) TTE with bubble study 02/12/2022: EF 55-60%, mild LVH, no RWMAs, no IAS, G1DD   Chronic bronchitis (HCC)    CKD (chronic kidney disease), stage III (HCC)    Constipation    a.) on linaclotide    COPD (chronic obstructive pulmonary disease) (HCC)    Coronary artery disease    a.) LHC 04/13/2011: normal coronaries; no CAD; b.) MV 10/29/2020: mod p-mLAD disease   Depression    Diabetic neuropathy (HCC)    Dyspnea    Encephalopathy acute    Gastric ulcer    GERD (gastroesophageal reflux disease)    Headache    Heart attack (HCC) 2009   Hepatomegaly    History of 2019 novel coronavirus disease (COVID-19) 04/28/2020   PCR (+): 04/28/2020, 02/14/2021   Hyperlipidemia    Hypertension    Hypomagnesemia    Hypothyroid    IC (interstitial cystitis)    IDA (iron deficiency anemia)    IgG deficiency (HCC)    Liver lesion    Favored to be benign on CT; MRI of abd w/contract in Aug 2016. Liver Biopsy-Negative, March 2016   Long term current use of aspirin     Long term current use of clopidogrel     Migraines    Morbid obesity (HCC)  NASH (nonalcoholic steatohepatitis)    OSA (obstructive sleep apnea)    a.) no nocturnal PAP therapy; issues with fit of DME; using 2L/[place] supplemental oxygen    Palpitation    Peripheral vascular disease    Pneumonia    Recurrent falls    RLS (restless legs syndrome)    Schizoaffective disorder, bipolar type (HCC)    Seasonal allergies    Tachycardia    TIA (transient ischemic attack) 2023   a.) on DAPT therapy per neurology recommendations; started 10/2021   Type 2 diabetes, uncontrolled, with neuropathy 10/15/2015   Overview:  Microalbumin 7.7 12/2010: Hgb A1c 10.2% 12/2010 and 9.7% 03/2011, eye exam 10/2009 [facility]    Urinary incontinence    Vertigo    Vitamin B12 deficiency    Vitamin D  deficiency     Past Surgical History:  Procedure Laterality Date   ABDOMINAL HYSTERECTOMY     Partial   bladder stim  2007   BLADDER SURGERY     x 2. tacking of bladder and the other was the stimulator placement   CHOLECYSTECTOMY     COLONOSCOPY WITH PROPOFOL  N/A 03/04/2019   Procedure: COLONOSCOPY WITH PROPOFOL ;  Surgeon: Jinny Carmine, MD;  Location: [facility] ENDOSCOPY;  Service: Endoscopy;  Laterality: N/A;   ESOPHAGOGASTRODUODENOSCOPY (EGD) WITH PROPOFOL  N/A 03/04/2019   Procedure: ESOPHAGOGASTRODUODENOSCOPY (EGD) WITH PROPOFOL ;  Surgeon: Jinny Carmine, MD;  Location: [facility] ENDOSCOPY;  Service: Endoscopy;  Laterality: N/A;   INCISION AND DRAINAGE ABSCESS Right 04/16/2019   Procedure: INCISION AND DRAINAGE ABSCESS;  Surgeon: Tye Millet, DO;  Location: [facility] ORS;  Service: General;  Laterality: Right;   INTERSTIM IMPLANT PLACEMENT N/A 07/16/2023   Procedure: RENNA IMPLANT FIRST STAGE WITH IMPEDANCE CHECK;  Surgeon: Gaston Hamilton, MD;  Location: [facility] ORS;  Service: Urology;  Laterality: N/A;   INTERSTIM IMPLANT PLACEMENT N/A 07/16/2023   Procedure: RENNA IMPLANT SECOND STAGE;  Surgeon: Gaston Hamilton, MD;  Location: [facility] ORS;  Service: Urology;  Laterality: N/A;   INTERSTIM IMPLANT REMOVAL N/A 07/16/2023   Procedure: REMOVAL OF INTERSTIM IMPLANT;  Surgeon: Gaston Hamilton, MD;  Location: [facility] ORS;  Service: Urology;  Laterality: N/A;   INTERSTIM-GENERATOR CHANGE N/A 12/31/2017   Procedure: INTERSTIM-GENERATOR CHANGE;  Surgeon: Gaston Hamilton, MD;  Location: [facility] ORS;  Service: Urology;  Laterality: N/A;   LEFT HEART CATH AND CORONARY ANGIOGRAPHY Left 04/13/2011   Procedure: LEFT HEART CATH AND CORONARY ANGIOGRAPHY; Location: [facility]; Surgeon: Wolm Rhyme, MD   LIVER BIOPSY  11/2014   Negative   LUNG BIOPSY  2015   small spot on lung   TUBAL LIGATION     UPPER GI ENDOSCOPY     WRIST SURGERY Right 11/02/2019      Allergies:   Mirtazapine, Morphine and codeine,  Sulfamethoxazole-trimethoprim, Amoxicillin  er, Codeine, Hydrocodone -acetaminophen , Keflex  [cephalexin ], and Prasterone    Social History   Tobacco Use   Smoking status: Never   Smokeless tobacco: Never  Vaping Use   Vaping status: Never Used  Substance Use Topics   Alcohol use: No   Drug use: No     Current Outpatient Medications on File Prior to Visit  Medication Sig Dispense Refill   atorvastatin  (LIPITOR ) 80 MG tablet TAKE 1 TABLET BY MOUTH AT BEDTIME 90 tablet 0   azelastine  (ASTELIN ) 0.1 % nasal spray Place 2 sprays into both nostrils 2 (two) times daily. Use in each nostril as directed 30 mL 2   busPIRone  (BUSPAR ) 30 MG tablet Take 30 mg by mouth 2 (two) times daily.  citalopram  (CELEXA ) 20 MG tablet Take by mouth.     Dulaglutide  (TRULICITY ) 1.5 MG/0.5ML SOAJ Inject into the skin.     DULoxetine  (CYMBALTA ) 60 MG capsule 1 capsule.     fenofibrate  micronized (LOFIBRA) 134 MG capsule Take 1 capsule (134 mg total) by mouth daily. 90 capsule 1   gabapentin  (NEURONTIN ) 600 MG tablet Take 600 mg by mouth at bedtime. (Patient taking differently: Take 400 mg by mouth 2 (two) times daily.)     hydrOXYzine  (ATARAX ) 50 MG tablet Take 50 mg by mouth 3 (three) times daily.     insulin  glargine, 2 Unit Dial, (TOUJEO  MAX) 300 UNIT/ML Solostar Pen Inject 80 Units into the skin.     ketoconazole  (NIZORAL ) 2 % cream Apply 1 Application topically 2 (two) times daily. 120 g 0   lamoTRIgine  (LAMICTAL ) 200 MG tablet Take 200 mg by mouth 2 (two) times daily.      levothyroxine  (SYNTHROID ) 50 MCG tablet Take 1 tablet (50 mcg total) by mouth daily. 30 tablet 1   metoprolol  tartrate (LOPRESSOR ) 25 MG tablet TAKE 1 TABLET BY MOUTH TWICE A DAY 60 tablet 0   nitroGLYCERIN  (NITROSTAT ) 0.4 MG SL tablet Place 1 tablet (0.4 mg total) under the tongue every 5 (five) minutes as needed for chest pain. 25 tablet 3   ondansetron  (ZOFRAN -ODT) 8 MG disintegrating tablet Take 1 tablet (8 mg total) by mouth every 8  (eight) hours as needed for nausea or vomiting. 20 tablet 0   QUEtiapine  (SEROQUEL ) 300 MG tablet Take 300 mg by mouth at bedtime.     ezetimibe  (ZETIA ) 10 MG tablet Take 1 tablet (10 mg total) by mouth daily. (Patient not taking: Reported on 07/29/2024) 90 tablet 1   fluconazole  (DIFLUCAN ) 150 MG tablet Take 1 tablet (150 mg total) by mouth every other day. (Patient not taking: Reported on 07/29/2024) 3 tablet 1   OneTouch Delica Lancets 30G MISC by Does not apply route. (Patient not taking: Reported on 07/29/2024)     ONETOUCH VERIO test strip  (Patient not taking: Reported on 07/29/2024)     No current facility-administered medications on file prior to visit.     Family Hx: [name] family history includes Asthma in her daughter; Bipolar disorder in her daughter and daughter; Breast cancer (age of onset: 72) in her maternal grandmother; Cancer in her daughter, maternal grandfather, and paternal grandmother; Congestive Heart Failure in her mother; Diabetes in her mother; Glaucoma in her mother; Heart disease in her maternal grandfather and mother; Hyperlipidemia in her mother; Hypertension in her daughter, father, and mother; Hypothyroidism in her mother; Intracerebral hemorrhage in her brother; Kidney disease in her brother, maternal grandfather, and mother; Mental illness in her mother; Osteoporosis in her mother; Restless legs syndrome in her daughter; Rheum arthritis in her maternal grandfather; Stroke in her mother.  ROS:   Please see the history of present illness.    Review of Systems  Constitutional: Negative.   HENT: Negative.    Respiratory: Negative.    Cardiovascular: Negative.   Gastrointestinal: Negative.   Musculoskeletal: Negative.   Neurological: Negative.   Psychiatric/Behavioral: Negative.    All other systems reviewed and are negative.    Labs/Other Tests and Data Reviewed:    Recent Labs: 09/22/2023: Magnesium  1.8 04/17/2024: TSH 1.61 04/18/2024: ALT 16; BUN  30; Creatinine, Ser 1.53; Hemoglobin 11.3; Platelets 387; Potassium 4.3; Sodium 138   Recent Lipid Panel Lab Results  Component Value Date/Time   CHOL 88 05/09/2023 11:57 AM  CHOL 196 09/22/2015 01:39 PM   CHOL 141 10/25/2013 03:14 AM   TRIG 233 (H) 05/09/2023 11:57 AM   TRIG 830 (H) 10/25/2013 03:14 AM   HDL 28 (L) 05/09/2023 11:57 AM   HDL 35 (L) 09/22/2015 01:39 PM   HDL 9 (L) 10/25/2013 03:14 AM   CHOLHDL 3.1 05/09/2023 11:57 AM   LDLCALC 31 05/09/2023 11:57 AM   LDLCALC SEE COMMENT 10/25/2013 03:14 AM   LDLDIRECT 65.4 06/08/2008 02:56 PM    Wt Readings from Last 3 Encounters:  07/29/24 187 lb 6 oz (85 kg)  07/03/24 182 lb 3.2 oz (82.6 kg)  04/18/24 174 lb (78.9 kg)     Exam:    Vital Signs: Vital signs may also be detailed in the HPI Ht 4' 11 (1.499 m)   Wt 187 lb 6 oz (85 kg)   BMI 37.85 kg/m    Constitutional:  oriented to person, place, and time. No distress.  HENT:  Head: Normocephalic and atraumatic.  Eyes:  no discharge. No scleral icterus.  Neck: Normal range of motion. Neck supple. No JVD present.  Cardiovascular: Normal rate, regular rhythm, normal heart sounds and intact distal pulses. Exam reveals no gallop and no friction rub. No edema No murmur heard. Pulmonary/Chest: Effort normal and breath sounds normal. No stridor. No respiratory distress.  no wheezes.  no rales.  no tenderness.  Abdominal: Soft.  no distension.  no tenderness.  Musculoskeletal: Normal range of motion.  no  tenderness or deformity.  Neurological:  normal muscle tone. Coordination normal. No atrophy Skin: Skin is warm and dry. No rash noted. not diaphoretic.  Psychiatric:  normal mood and affect. behavior is normal. Thought content normal.    ASSESSMENT & PLAN:    Problem List Items Addressed This Visit       Cardiology Problems   Chronic diastolic CHF (congestive heart failure) (HCC)   Relevant Orders   EKG 12-Lead     Other   Obstructive sleep apnea   Other Visit  Diagnoses       Chronic chest pain    -  Primary     Essential hypertension       Relevant Orders   EKG 12-Lead     Mixed hyperlipidemia         Coronary artery disease involving native coronary artery of native heart without angina pectoris       Relevant Orders   EKG 12-Lead     Palpitations       Relevant Orders   EKG 12-Lead       Chest pain History of atypical symptoms,  Factors include diabetes, hyperlipidemia Coronary disease noted on chest CT scan pharmacologic Myoview  2022 low risk scan CT with mild aortic atherosclerosis, moderate proximal to mid LAD disease.  No further testing at this time  Diabetes type 2 poorly controlled Followed by endocrinology, A1c trending higher at 10.8 Recommended follow-up with endocrine Discussed complications from poorly controlled diabetes  Hyperlipidemia Continue Lipitor  with Zetia  She is unclear if she is taking Zetia  at home, cholesterol numbers well-controlled Recommend she look for Zetia  at home and call us  if she is not taking it  Essential hypertension Blood pressure low, did not have breakfast, recommend she hydrate  chronic renal failure and diabetes poorly controlled A1c high 10.8 Creatinine trending higher 1.53 Recommend she stay hydrated Appears she is not on Jardiance   Signed, Bernis Stecher, MD  [facility] [address], [place],  [place] [address]

## 2024-07-29 ENCOUNTER — Ambulatory Visit: Attending: Cardiovascular Disease | Admitting: Cardiovascular Disease

## 2024-07-29 ENCOUNTER — Encounter: Payer: Self-pay | Admitting: Cardiovascular Disease

## 2024-07-29 ENCOUNTER — Ambulatory Visit: Admitting: Family Medicine

## 2024-07-29 VITALS — BP 100/58 | HR 79 | Ht 59.0 in | Wt 187.4 lb

## 2024-07-29 DIAGNOSIS — G4733 Obstructive sleep apnea (adult) (pediatric): Secondary | ICD-10-CM

## 2024-07-29 DIAGNOSIS — I10 Essential (primary) hypertension: Secondary | ICD-10-CM | POA: Diagnosis not present

## 2024-07-29 DIAGNOSIS — I2510 Atherosclerotic heart disease of native coronary artery without angina pectoris: Secondary | ICD-10-CM | POA: Diagnosis not present

## 2024-07-29 DIAGNOSIS — I700 Atherosclerosis of aorta: Secondary | ICD-10-CM

## 2024-07-29 DIAGNOSIS — R002 Palpitations: Secondary | ICD-10-CM

## 2024-07-29 DIAGNOSIS — G8929 Other chronic pain: Secondary | ICD-10-CM | POA: Diagnosis not present

## 2024-07-29 DIAGNOSIS — R079 Chest pain, unspecified: Secondary | ICD-10-CM

## 2024-07-29 DIAGNOSIS — I5032 Chronic diastolic (congestive) heart failure: Secondary | ICD-10-CM

## 2024-07-29 DIAGNOSIS — E782 Mixed hyperlipidemia: Secondary | ICD-10-CM

## 2024-07-29 MED ORDER — METOPROLOL TARTRATE 25 MG PO TABS
25.0000 mg | ORAL_TABLET | Freq: Two times a day (BID) | ORAL | 3 refills | Status: AC
Start: 2024-07-29 — End: ?

## 2024-07-29 MED ORDER — ATORVASTATIN CALCIUM 80 MG PO TABS: 80.0000 mg | ORAL_TABLET | Freq: Every day | ORAL | 3 refills | Status: AC

## 2024-07-29 MED ORDER — EZETIMIBE 10 MG PO TABS: 10.0000 mg | ORAL_TABLET | Freq: Every day | ORAL | 3 refills | Status: AC

## 2024-07-29 MED ORDER — NITROGLYCERIN 0.4 MG SL SUBL: 0.4000 mg | SUBLINGUAL_TABLET | SUBLINGUAL | 3 refills | Status: AC | PRN

## 2024-07-29 MED ORDER — FENOFIBRATE MICRONIZED 134 MG PO CAPS: 134.0000 mg | ORAL_CAPSULE | Freq: Every day | ORAL | 3 refills | Status: AC

## 2024-07-29 NOTE — Patient Instructions (Signed)
 Medication Instructions:  No changes  If you need a refill on your cardiac medications before your next appointment, please call your pharmacy.   Lab work: No new labs needed  Testing/Procedures: No new testing needed  Follow-Up: At [facility], you and your health needs are our priority.  As part of our continuing mission to provide you with exceptional heart care, we have created designated Provider Care Teams.  These Care Teams include your primary Cardiologist (physician) and Advanced Practice Providers (APPs -  Physician Assistants and Nurse Practitioners) who all work together to provide you with the care you need, when you need it.  You will need a follow up appointment in 12 months  Providers on your designated Care Team:   Lonni Meager, NP Bernardino Bring, PA-C Cadence [place], [place]  COVID-19 Vaccine Information can be found at: [email or web address] For questions related to vaccine distribution or appointments, please email [email or web address] or call [phone].

## 2024-08-11 ENCOUNTER — Encounter: Payer: Self-pay | Admitting: Family Medicine

## 2024-08-11 ENCOUNTER — Ambulatory Visit: Admitting: Family Medicine

## 2024-08-11 ENCOUNTER — Ambulatory Visit: Payer: Self-pay | Admitting: *Deleted

## 2024-08-11 VITALS — BP 124/78 | HR 85 | Temp 98.2°F | Resp 18 | Ht 59.0 in | Wt 179.0 lb

## 2024-08-11 DIAGNOSIS — R0602 Shortness of breath: Secondary | ICD-10-CM | POA: Diagnosis not present

## 2024-08-11 DIAGNOSIS — J449 Chronic obstructive pulmonary disease, unspecified: Secondary | ICD-10-CM | POA: Diagnosis not present

## 2024-08-11 DIAGNOSIS — N183 Chronic kidney disease, stage 3 unspecified: Secondary | ICD-10-CM | POA: Diagnosis not present

## 2024-08-11 DIAGNOSIS — I5032 Chronic diastolic (congestive) heart failure: Secondary | ICD-10-CM | POA: Diagnosis not present

## 2024-08-11 DIAGNOSIS — R051 Acute cough: Secondary | ICD-10-CM | POA: Diagnosis not present

## 2024-08-11 DIAGNOSIS — I129 Hypertensive chronic kidney disease with stage 1 through stage 4 chronic kidney disease, or unspecified chronic kidney disease: Secondary | ICD-10-CM | POA: Diagnosis not present

## 2024-08-11 DIAGNOSIS — R110 Nausea: Secondary | ICD-10-CM

## 2024-08-11 DIAGNOSIS — R52 Pain, unspecified: Secondary | ICD-10-CM

## 2024-08-11 DIAGNOSIS — R079 Chest pain, unspecified: Secondary | ICD-10-CM

## 2024-08-11 DIAGNOSIS — Z9981 Dependence on supplemental oxygen: Secondary | ICD-10-CM | POA: Diagnosis not present

## 2024-08-11 LAB — POC COVID19/FLU A&B COMBO
Covid Antigen, POC: NEGATIVE
Influenza A Antigen, POC: NEGATIVE
Influenza B Antigen, POC: NEGATIVE

## 2024-08-11 MED ORDER — BENZONATATE 100 MG PO CAPS: 100.0000 mg | ORAL_CAPSULE | Freq: Two times a day (BID) | ORAL | 0 refills | Status: AC | PRN

## 2024-08-11 MED ORDER — ALBUTEROL SULFATE HFA 108 (90 BASE) MCG/ACT IN AERS: 1.0000 | INHALATION_SPRAY | Freq: Four times a day (QID) | RESPIRATORY_TRACT | 0 refills | Status: AC | PRN

## 2024-08-11 MED ORDER — ONDANSETRON 4 MG PO TBDP: 4.0000 mg | ORAL_TABLET | Freq: Two times a day (BID) | ORAL | 0 refills | Status: AC | PRN

## 2024-08-11 NOTE — Progress Notes (Signed)
 Acute Office Visit  Subjective:     Patient ID: [name], female    DOB: 02-24-1966, 58 y.o.   MRN: [id number]  Chief Complaint  Patient presents with   Sinusitis    X2 days   Cough    Non-productive   Shortness of Breath    W/exertion. Does have CHF/COPD and 2L of O2 at night   Generalized Body Aches    X2 days    HPI Patient is in today for complaints of shortness of breath, cough, and body aches. She reports symptoms started two days ago. She reports shortness of breath is all the time, stating with rest and with exertion. She does wear 2L oxygen  at night chronically. She describes cough as a dry, nonproductive cough. She also endorses associated chest pain, nausea and sinus congestion. She feels that chest pain is s/t cough. With cardiac hx EKG is completed and stable showing NSR.  She admits she has not been taking anything at home to help with symptoms.  Review of Systems  Respiratory:  Positive for cough and shortness of breath.   Cardiovascular:  Positive for chest pain. Negative for leg swelling.  Gastrointestinal:  Positive for nausea. Negative for vomiting.  Musculoskeletal:  Positive for myalgias.        Objective:    BP 124/78   Pulse 85   Temp 98.2 F (36.8 C)   Resp 18   Ht 4' 11 (1.499 m)   Wt 179 lb (81.2 kg)   SpO2 94%   BMI 36.15 kg/m  BP Readings from Last 3 Encounters:  08/11/24 124/78  07/29/24 (!) 100/58  07/03/24 126/78   Wt Readings from Last 3 Encounters:  08/11/24 179 lb (81.2 kg)  07/29/24 187 lb 6 oz (85 kg)  07/03/24 182 lb 3.2 oz (82.6 kg)      Physical Exam Constitutional:      Appearance: She is obese. She is ill-appearing. She is not toxic-appearing.  HENT:     Head: Normocephalic and atraumatic.     Mouth/Throat:     Pharynx: Uvula midline. Postnasal drip present. No oropharyngeal exudate.     Tonsils: No tonsillar exudate or tonsillar abscesses.  Cardiovascular:     Rate and Rhythm: Normal rate and regular  rhythm.  Pulmonary:     Effort: Pulmonary effort is normal. No respiratory distress.     Breath sounds: No wheezing, rhonchi or rales.  Musculoskeletal:     Right lower leg: No edema.     Left lower leg: No edema.  Lymphadenopathy:     Cervical: Cervical adenopathy present.  Neurological:     General: No focal deficit present.     Mental Status: She is alert.  Psychiatric:        Mood and Affect: Mood normal.        Behavior: Behavior normal.     Results for orders placed or performed in visit on 08/11/24  POC Covid19/Flu A&B Antigen  Result Value Ref Range   Influenza A Antigen, POC Negative Negative   Influenza B Antigen, POC Negative Negative   Covid Antigen, POC Negative Negative   EKG: normal EKG, normal sinus rhythm.       Latest Ref Rng & Units 04/18/2024    2:45 PM 04/17/2024   11:09 AM 04/10/2024    4:05 PM  BMP  Glucose 70 - 99 mg/dL 709  780  699   BUN 6 - 20 mg/dL 30  30  36  Creatinine 0.44 - 1.00 mg/dL 8.46  8.43  8.58   BUN/Creat Ratio 6 - 22 (calc)  19    Sodium 135 - 145 mmol/L 138  140  139   Potassium 3.5 - 5.1 mmol/L 4.3  4.4  4.8   Chloride 98 - 111 mmol/L 105  101  104   CO2 22 - 32 mmol/L 22  30  26    Calcium  8.9 - 10.3 mg/dL 7.9  8.6  8.7           Assessment & Plan:   1. Shortness of breath (Primary) Patient complains of shortness of breath at rest and with exertion. Comorbidities include CHF and COPD. She wears 2L oxygen  nightly chronically. V/s stable. Bilateral lower lung fields diminished otherwise chest is clear to auscultation.  -Recommended chest x-ray but patient refused. -Rapid COVID and rapid flu testing negative.  -Albuterol  inhaler refilled.  -Return precautions advised.  - EKG 12-Lead - albuterol  (VENTOLIN  HFA) 108 (90 Base) MCG/ACT inhaler; Inhale 1-2 puffs into the lungs every 6 (six) hours as needed for shortness of breath.  Dispense: 8 g; Refill: 0 -6 week f/u recommended with PCP.   2. Chest pain, unspecified  type Chest pain x 2 days with other chronic conditions. EKG obtained; EKG stable.   - EKG 12-Lead  3. Acute cough Nonproductive cough x2 days.   -Chest x-ray advised however refused by patient.  - albuterol  (VENTOLIN  HFA) 108 (90 Base) MCG/ACT inhaler; Inhale 1-2 puffs into the lungs every 6 (six) hours as needed for shortness of breath.  Dispense: 8 g; Refill: 0 - benzonatate  (TESSALON ) 100 MG capsule; Take 1 capsule (100 mg total) by mouth 2 (two) times daily as needed for cough.  Dispense: 20 capsule; Refill: 0  4. Generalized body aches She complains of bodyaches for the last 2 days. Rapid COVID and rapid flu testing negative. She states her daughter and grandchild have similar symptoms.   -Recommended symptom management- ok to take OTC Tylenol  or OTC Coricidin as needed. - POC Covid19/Flu A&B Antigen  5. Nausea She complains of nausea x2 days. Zofran  prescription provided to be taken as needed for nausea.  -Recommended adequate fluid intake, cautious to not over hydrate due to CHF diagnosis, and incorporate bland diet/solids as tolerated.  - ondansetron  (ZOFRAN -ODT) 4 MG disintegrating tablet; Take 1 tablet (4 mg total) by mouth 2 (two) times daily as needed for nausea or vomiting.  Dispense: 20 tablet; Refill: 0  6. Chronic diastolic CHF (congestive heart failure) (HCC) Shortness of breath and chest pain reported for the last 2 days. PE negative for bilateral lower extremity edema. PE negative for rhonci, crackles, or rales. Due to comorbidity (CHF) EKG obtained. EKG stable without acute abnormalities, NSR.   - EKG 12-Lead  7. Chronic obstructive pulmonary disease, unspecified COPD type (HCC) COPD diagnosis requiring 2L of oxygen  at bedtime. Patient reports she does not follow with pulmonology, and that COPD managed by PCP.  8. Dependence on supplemental oxygen  Patient with COPD diagnosis wearing 2L of oxygen  at bedtime.   9. Benign hypertension with CKD (chronic kidney  disease) stage III (HCC) V/s stable. CKD considered with medications prescribed. 6 week f/u with PCP.   -EKG 12-Lead  Return in about 6 weeks (around 09/22/2024) for chronic condition follow up with PCP.  LAYMON LOISE CORE, FNP

## 2024-08-11 NOTE — Telephone Encounter (Signed)
   FYI Only or Action Required?: FYI only for provider: appointment scheduled on 08/11/24.  Patient was last seen in primary care on 07/03/2024 by [name], FNP.  Called Nurse Triage reporting Shortness of Breath.  Symptoms began several days ago.  Interventions attempted: Rest, hydration, or home remedies.  Symptoms are: gradually worsening.  Triage Disposition: See HCP Within 4 Hours (Or PCP Triage)  Patient/caregiver understands and will follow disposition?: Yes                   Copied from CRM [id number]. Topic: Clinical - Red Word Triage >> Aug 11, 2024  7:59 AM [name] wrote: Red Word that prompted transfer to Nurse Triage: body aches, nose stuffed up, back and chest hurts when coughing, can't breath Reason for Disposition  [1] MILD difficulty breathing (e.g., minimal/no SOB at rest, SOB with walking, pulse < 100) AND [2] NEW-onset or WORSE than normal  Answer Assessment - Initial Assessment Questions No available appt with PCP scheduled appt with other provider today . Recommended ED for worsening sx and patient reports she does not want to go to ED and sx at this time, able to be seen in [facility].   CAL aware   Recommended if sx worsening go to ED or call 911.    1. RESPIRATORY STATUS: Describe your breathing? (e.g., wheezing, shortness of breath, unable to speak, severe coughing)      Shortness of breath at rest with coughing episodes  2. ONSET: When did this breathing problem begin?      2 days ago  3. PATTERN Does the difficult breathing come and go, or has it been constant since it started?      Comes and goes with coughing  4. SEVERITY: How bad is your breathing? (e.g., mild, moderate, severe)      Worsening nasal congestion can't breath through nose 5. RECURRENT SYMPTOM: Have you had difficulty breathing before? If Yes, ask: When was the last time? and What happened that time?      Yes hx  6. CARDIAC HISTORY: Do you have any history  of heart disease? (e.g., heart attack, angina, bypass surgery, angioplasty)      CHF 7. LUNG HISTORY: Do you have any history of lung disease?  (e.g., pulmonary embolus, asthma, emphysema)     COPD 8. CAUSE: What do you think is causing the breathing problem?      Has not tested for covid or flu  9. OTHER SYMPTOMS: Do you have any other symptoms? (e.g., chest pain, cough, dizziness, fever, runny nose)     Chest pain tightness with coughing. Nonproductive. No fever no dizziness, stuffy nose difficulty breathing with coughing episodes.  No chest pain now or SOB at rest ,body aches 10. O2 SATURATION MONITOR:  Do you use an oxygen  saturation monitor (pulse oximeter) at home? If Yes, ask: What is your reading (oxygen  level) today? What is your usual oxygen  saturation reading? (e.g., 95%)       na 11. PREGNANCY: Is there any chance you are pregnant? When was your last menstrual period?       na 12. TRAVEL: Have you traveled out of the country in the last month? (e.g., travel history, exposures)       na  Protocols used: Breathing Difficulty-A-AH

## 2024-10-10 NOTE — Progress Notes (Signed)
 [name]                                          MRN: [id number]   10/10/2024   The [facility] Quality Team Specialist reviewed this patient medical record for the purposes of chart review for care gap closure. The following were reviewed: chart review for care gap closure-glycemic status assessment.    [facility] Quality Team
